# Patient Record
Sex: Male | Born: 1965 | Race: Black or African American | Hispanic: No | Marital: Married | Smoking: Former smoker
Health system: Southern US, Community
[De-identification: ages and names within clinical notes are randomized; demographics above are authoritative.]

## PROBLEM LIST (undated history)

## (undated) DIAGNOSIS — I2510 Atherosclerotic heart disease of native coronary artery without angina pectoris: Secondary | ICD-10-CM

## (undated) DIAGNOSIS — I471 Supraventricular tachycardia, unspecified: Secondary | ICD-10-CM

## (undated) DIAGNOSIS — I4711 Inappropriate sinus tachycardia, so stated: Secondary | ICD-10-CM

## (undated) DIAGNOSIS — I272 Pulmonary hypertension, unspecified: Secondary | ICD-10-CM

## (undated) DIAGNOSIS — I700 Atherosclerosis of aorta: Secondary | ICD-10-CM

## (undated) DIAGNOSIS — D649 Anemia, unspecified: Secondary | ICD-10-CM

## (undated) DIAGNOSIS — I10 Essential (primary) hypertension: Secondary | ICD-10-CM

## (undated) DIAGNOSIS — E46 Unspecified protein-calorie malnutrition: Secondary | ICD-10-CM

## (undated) DIAGNOSIS — Z7901 Long term (current) use of anticoagulants: Secondary | ICD-10-CM

## (undated) DIAGNOSIS — F1010 Alcohol abuse, uncomplicated: Secondary | ICD-10-CM

## (undated) DIAGNOSIS — G893 Neoplasm related pain (acute) (chronic): Secondary | ICD-10-CM

## (undated) DIAGNOSIS — K5790 Diverticulosis of intestine, part unspecified, without perforation or abscess without bleeding: Secondary | ICD-10-CM

## (undated) DIAGNOSIS — S72009A Fracture of unspecified part of neck of unspecified femur, initial encounter for closed fracture: Secondary | ICD-10-CM

## (undated) DIAGNOSIS — J449 Chronic obstructive pulmonary disease, unspecified: Secondary | ICD-10-CM

## (undated) DIAGNOSIS — I5020 Unspecified systolic (congestive) heart failure: Secondary | ICD-10-CM

## (undated) DIAGNOSIS — I7090 Unspecified atherosclerosis: Secondary | ICD-10-CM

## (undated) DIAGNOSIS — J329 Chronic sinusitis, unspecified: Secondary | ICD-10-CM

## (undated) DIAGNOSIS — Z87442 Personal history of urinary calculi: Secondary | ICD-10-CM

## (undated) DIAGNOSIS — I6789 Other cerebrovascular disease: Secondary | ICD-10-CM

## (undated) DIAGNOSIS — K760 Fatty (change of) liver, not elsewhere classified: Secondary | ICD-10-CM

## (undated) DIAGNOSIS — J439 Emphysema, unspecified: Secondary | ICD-10-CM

## (undated) DIAGNOSIS — C801 Malignant (primary) neoplasm, unspecified: Secondary | ICD-10-CM

## (undated) DIAGNOSIS — C3491 Malignant neoplasm of unspecified part of right bronchus or lung: Secondary | ICD-10-CM

## (undated) DIAGNOSIS — R0609 Other forms of dyspnea: Secondary | ICD-10-CM

## (undated) DIAGNOSIS — I071 Rheumatic tricuspid insufficiency: Secondary | ICD-10-CM

## (undated) DIAGNOSIS — E860 Dehydration: Secondary | ICD-10-CM

## (undated) HISTORY — DX: Neoplasm related pain (acute) (chronic): G89.3

## (undated) HISTORY — PX: TOE SURGERY: SHX1073

---
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
## 2008-05-23 ENCOUNTER — Emergency Department: Payer: Self-pay | Admitting: Emergency Medicine

## 2014-07-04 ENCOUNTER — Inpatient Hospital Stay: Payer: Self-pay | Admitting: Internal Medicine

## 2014-07-04 LAB — CBC
HCT: 47.6 % (ref 40.0–52.0)
HGB: 15.8 g/dL (ref 13.0–18.0)
MCH: 33.7 pg (ref 26.0–34.0)
MCHC: 33.1 g/dL (ref 32.0–36.0)
MCV: 102 fL — ABNORMAL HIGH (ref 80–100)
Platelet: 147 10*3/uL — ABNORMAL LOW (ref 150–440)
RBC: 4.68 10*6/uL (ref 4.40–5.90)
RDW: 12.7 % (ref 11.5–14.5)
WBC: 35 10*3/uL — ABNORMAL HIGH (ref 3.8–10.6)

## 2014-07-04 LAB — HEMOGLOBIN A1C: Hemoglobin A1C: 4.4 % (ref 4.2–6.3)

## 2014-07-04 LAB — APTT: ACTIVATED PTT: 29.8 s (ref 23.6–35.9)

## 2014-07-04 LAB — COMPREHENSIVE METABOLIC PANEL
ALT: 45 U/L
AST: 36 U/L (ref 15–37)
Albumin: 3.5 g/dL (ref 3.4–5.0)
Alkaline Phosphatase: 78 U/L
Anion Gap: 8 (ref 7–16)
BILIRUBIN TOTAL: 1 mg/dL (ref 0.2–1.0)
BUN: 13 mg/dL (ref 7–18)
CHLORIDE: 109 mmol/L — AB (ref 98–107)
CO2: 27 mmol/L (ref 21–32)
Calcium, Total: 8.7 mg/dL (ref 8.5–10.1)
Creatinine: 1.08 mg/dL (ref 0.60–1.30)
Glucose: 152 mg/dL — ABNORMAL HIGH (ref 65–99)
Osmolality: 290 (ref 275–301)
POTASSIUM: 3.5 mmol/L (ref 3.5–5.1)
SODIUM: 144 mmol/L (ref 136–145)
Total Protein: 7.2 g/dL (ref 6.4–8.2)

## 2014-07-04 LAB — CK TOTAL AND CKMB (NOT AT ARMC)
CK, Total: 126 U/L (ref 39–308)
CK-MB: 1.6 ng/mL (ref 0.5–3.6)

## 2014-07-04 LAB — TROPONIN I
Troponin-I: 0.1 ng/mL — ABNORMAL HIGH
Troponin-I: 0.19 ng/mL — ABNORMAL HIGH
Troponin-I: 0.21 ng/mL — ABNORMAL HIGH

## 2014-07-04 LAB — PROTIME-INR
INR: 1
Prothrombin Time: 13.1 secs (ref 11.5–14.7)

## 2014-07-04 LAB — PRO B NATRIURETIC PEPTIDE: B-TYPE NATIURETIC PEPTID: 321 pg/mL — AB (ref 0–125)

## 2014-07-05 DIAGNOSIS — I361 Nonrheumatic tricuspid (valve) insufficiency: Secondary | ICD-10-CM

## 2014-07-05 LAB — BASIC METABOLIC PANEL
Anion Gap: 9 (ref 7–16)
BUN: 9 mg/dL (ref 7–18)
CHLORIDE: 103 mmol/L (ref 98–107)
Calcium, Total: 9.1 mg/dL (ref 8.5–10.1)
Co2: 26 mmol/L (ref 21–32)
Creatinine: 0.76 mg/dL (ref 0.60–1.30)
EGFR (Non-African Amer.): >60
Glucose: 122 mg/dL — ABNORMAL HIGH (ref 65–99)
OSMOLALITY: 276 (ref 275–301)
POTASSIUM: 3.9 mmol/L (ref 3.5–5.1)
Sodium: 138 mmol/L (ref 136–145)

## 2014-07-05 LAB — CBC WITH DIFFERENTIAL/PLATELET
Basophil #: 0.1 10*3/uL (ref 0.0–0.1)
Basophil %: 0.4 %
EOS PCT: 0 %
Eosinophil #: 0 10*3/uL (ref 0.0–0.7)
HCT: 46.5 % (ref 40.0–52.0)
HGB: 15.6 g/dL (ref 13.0–18.0)
LYMPHS PCT: 2.1 %
Lymphocyte #: 0.7 10*3/uL — ABNORMAL LOW (ref 1.0–3.6)
MCH: 34 pg (ref 26.0–34.0)
MCHC: 33.5 g/dL (ref 32.0–36.0)
MCV: 102 fL — AB (ref 80–100)
Monocyte #: 0.8 x10 3/mm (ref 0.2–1.0)
Monocyte %: 2.4 %
NEUTROS PCT: 95.1 %
Neutrophil #: 32.4 10*3/uL — ABNORMAL HIGH (ref 1.4–6.5)
Platelet: 138 10*3/uL — ABNORMAL LOW (ref 150–440)
RBC: 4.58 10*6/uL (ref 4.40–5.90)
RDW: 13 % (ref 11.5–14.5)
WBC: 34.1 10*3/uL — ABNORMAL HIGH (ref 3.8–10.6)

## 2014-07-07 LAB — CBC WITH DIFFERENTIAL/PLATELET
Basophil #: 0.1 10*3/uL (ref 0.0–0.1)
Basophil %: 0.6 %
Eosinophil #: 0.3 10*3/uL (ref 0.0–0.7)
Eosinophil %: 2.8 %
HCT: 46.9 % (ref 40.0–52.0)
HGB: 15.6 g/dL (ref 13.0–18.0)
Lymphocyte #: 1.5 10*3/uL (ref 1.0–3.6)
Lymphocyte %: 12.9 %
MCH: 34.1 pg — AB (ref 26.0–34.0)
MCHC: 33.4 g/dL (ref 32.0–36.0)
MCV: 102 fL — AB (ref 80–100)
Monocyte #: 0.6 x10 3/mm (ref 0.2–1.0)
Monocyte %: 5.6 %
Neutrophil #: 9 10*3/uL — ABNORMAL HIGH (ref 1.4–6.5)
Neutrophil %: 78.1 %
Platelet: 139 10*3/uL — ABNORMAL LOW (ref 150–440)
RBC: 4.58 10*6/uL (ref 4.40–5.90)
RDW: 12.7 % (ref 11.5–14.5)
WBC: 11.5 10*3/uL — AB (ref 3.8–10.6)

## 2014-07-07 LAB — BASIC METABOLIC PANEL
ANION GAP: 6 — AB (ref 7–16)
BUN: 13 mg/dL (ref 7–18)
CREATININE: 1.05 mg/dL (ref 0.60–1.30)
Calcium, Total: 9 mg/dL (ref 8.5–10.1)
Chloride: 106 mmol/L (ref 98–107)
Co2: 27 mmol/L (ref 21–32)
EGFR (Non-African Amer.): >60
GLUCOSE: 93 mg/dL (ref 65–99)
Osmolality: 277 (ref 275–301)
Potassium: 3.2 mmol/L — ABNORMAL LOW (ref 3.5–5.1)
Sodium: 139 mmol/L (ref 136–145)

## 2014-07-09 LAB — CULTURE, BLOOD (SINGLE)

## 2014-10-31 NOTE — Consult Note (Signed)
PATIENT NAME:  Samuel Choi, Samuel Choi MR#:  [id number] DATE OF BIRTH:  1966/01/31  DATE OF CONSULTATION:  07/05/2014  CONSULTING PHYSICIAN:  Dr. Posey Pronto  REASON FOR CONSULTATION: Elevation of troponin.   CHIEF COMPLAINT: "I am short of breath."   HISTORY OF PRESENT ILLNESS:  This is a 49 year old male with no evidence of cardiac history, who has had new onset of cough, congestion, and severe shortness of breath over the last 48 hours for which he has had an elevated white blood cell count as well as rhonchi and other symptoms, but no evidence of significant amount of chest discomfort. He does have an elevated troponin of 0.13 consistent with demand ischemia rather than acute coronary syndrome at this time. The patient has had no cardiovascular history, but there are family history issues. He has had some improvement with antibiotics oxygenation. He has had normal heart rate control with an EKG showing no evidence of significant myocardial ischemia.  The remainder review of systems negative for vision change, ringing in the ears, or hearing loss, heartburn, nausea, vomiting, diarrhea, bloody stools, stomach pain, extremity pain, leg weakness, cramping of the buttocks, known blood clots, headaches, blackouts, dizzy spells, nosebleeds, frequent urination, urination at night, muscle weakness, numbness, anxiety, depression, skin lesions or skin rashes.   PAST MEDICAL HISTORY: Essential hypertension.   FAMILY HISTORY: Multiple family history of cardiovascular disease in the past as well as hypertension.   SOCIAL HISTORY: Currently denies alcohol or tobacco use.   ALLERGIES: As listed.   MEDICATIONS: As listed.   PHYSICAL EXAMINATION:  VITAL SIGNS: Blood pressure is 110/68 bilaterally. Heart rate is 72 upright, reclining, and regular.   GENERAL: He is a well-appearing male in no acute distress.   HEENT:  No icterus, thyromegaly, ulcers, hemorrhage, or xanthelasma.   CARDIOVASCULAR: Regular rate and rhythm  with normal S1 and S2 without murmur, gallop, or rub. PMI is diffuse. Carotid upstroke normal without bruit. Jugular venous pressure is normal.   LUNGS: Have expiratory wheezes and decrease in the bases.   ABDOMEN: Soft, nontender, without hepatosplenomegaly or masses. Abdominal aorta has normal size without bruit.   EXTREMITIES: Show 2+ radial, femoral, dorsal pedal pulses, with no lower extremity edema, cyanosis, clubbing or ulcers.   NEUROLOGIC: He is oriented to time, place, and person, with normal mood and affect.   ASSESSMENT: A 49 year old male with essential hypertension with acute elevated troponin, most consistent with demand ischemia from current illness and hypoxia, and pneumonia rather than acute coronary syndrome.   RECOMMENDATIONS:  1.  Consider echocardiogram for LV systolic dysfunction, valvular heart disease possibly contributing to above.  2.  Continue supportive care of pneumonia and/or hypoxia with antibiotics.  3.  Begin ambulation and follow for any other need for diagnostic testing and treatment options at this time.      ____________________________ Corey Skains, MD bjk:at D: 07/05/2014 09:13:00 ET T: 07/05/2014 10:35:02 ET JOB#: [id number]  cc: Corey Skains, MD, <Dictator> Corey Skains MD ELECTRONICALLY SIGNED 07/08/2014 13:48

## 2014-10-31 NOTE — H&P (Signed)
PATIENT NAME:  Samuel Choi, BOFFA MR#:  [id number] DATE OF BIRTH:  Feb 14, 1966  DATE OF ADMISSION:  07/04/2014  PRIMARY CARE PHYSICIAN: Unknown.  CHIEF COMPLAINT: Shortness of breath.   HISTORY OF PRESENT ILLNESS: This is a 49 year old male who presented to the ED today after 1 day of significant increased shortness of breath and dyspnea on exertion. He has no significant past medical history that he reports and is not on any medications currently. In the ED, he was found to have oxygen saturation at 87% on room air. The patient states that he has over the past 2 days been having some sensation of feeling hot, though he denies having measured any fever and denies any significant chills. Chest x-ray in the ED showed bilateral opacities consistent with either bilateral pneumonia or bilateral fluid on his lungs. The patient's troponin was 0.1 in the ED. His BNP was very mildly elevated at 321. His heart rate was tachycardic at just over 100. His temperature was borderline febrile. His white blood cell count was found to be 35,000. He was given a dose of vancomycin and a dose of Zosyn in the ED and the hospitalists were called for admission. The patient denies any alleviating factors to his shortness of breath and states that it is aggravated by exertion. He denies ever having anything like this before. He states that as a child he had asthma, but has not had any symptoms of his asthma for many years. The patient did not try to take any medications to alleviate his symptoms.   PAST MEDICAL HISTORY: The patient endorses only childhood asthma.   PAST SURGICAL HISTORY: The patient denies any surgical history.   FAMILY HISTORY:  The patient endorses a family history of CAD, diabetes, hypertension, hyperlipidemia.   ALLERGIES: No known drug allergies.   SOCIAL HISTORY: The patient lives with his wife at home. He is a smoker and has smoked for the last 30 years, about a half pack per day. He endorses alcohol use,  about 2 beers a couple of times a week and he denies any illicit drug use.   CURRENT MEDICATIONS: The patient denies any current medications.   REVIEW OF SYSTEMS:  CONSTITUTIONAL: Denies measured fever. Endorses some fatigue. Denies overt weakness. Denies any weight changes.   EYES: No blurring or double vision. No pain in his eyes. No redness.   ENT: No ear pain. No hearing loss. No rhinorrhea. No sinus pain. No difficulty swallowing.   RESPIRATORY: The patient endorses mild cough without sputum. Denies any wheezing. Denies hemoptysis. Endorses significant dyspnea on exertion. Denies any pain with respirations.   CARDIOVASCULAR: Denies any chest pain. Endorses orthopnea. Denies any edema. Denies palpitations. Denies syncope.   GASTROINTESTINAL: Denies nausea, vomiting, diarrhea. Denies abdominal pain. Denies hematemesis.   GENITOURINARY: Denies dysuria, denies hematuria, denies any frequency or incontinence.   HEMATOLOGIC: Denies any easy bruising or bleeding.   SKIN: Denies any acne, rash or lesions.   MUSCULOSKELETAL: Denies any pain in his joints or swelling in his joints. Denies gout. Denies arthritis.   NEUROLOGICAL: Denies any sensory changes. Denies any focal weakness. Denies headaches. Denies seizures.   PSYCHIATRIC: Denies any anxiety, insomnia or depression.   PHYSICAL EXAMINATION:  VITAL SIGNS: Temperature is 99.8, pulse 102, respirations 20, blood pressure 200/110, pulse oximetry 95% on 2 liters in the ED.   GENERAL: The patient is sitting up in bed in no acute distress and appears well nourished.   HEENT: Pupils equal, round and reactive  to light. Extraocular movements intact, though he does have left eye lateral gaze deviation, which is chronic. No scleral icterus. No difficulty hearing. Moist mucosal membranes.   NECK: No thyromegaly. His neck is supple with no masses and it is nontender. No cervical lymphadenopathy.   RESPIRATORY: Has bilateral decreased basilar  breath sounds with rhonchi present in both bases. He has good air movement in his lungs on both sides from his mid field up without any abnormal sounds above his bases. His breathing is not labored. No wheezing was appreciated on auscultation.   CARDIOVASCULAR: Tachycardic with a regular rhythm. No murmurs auscultated. S1, S2 present. His chest is nontender. He has good pedal pulses without any lower extremity edema.   ABDOMEN: Soft, nontender, nondistended. Good bowel sounds without any organomegaly.   MUSCULOSKELETAL: He has no joint swelling. No cyanosis. No clubbing.   SKIN: No rash or lesions noted. No induration. His skin is warm and dry.   NEUROLOGIC: His cranial nerves are intact except for the chronic left eye lateral gaze deviation as previously noted. He has good sensation intact throughout with no dysarthria or aphasia. No focal deficits noted.   PSYCHIATRIC: He is alert and oriented x 3. He is cooperative. He demonstrates good judgment without any depressed mood or affect.   PERTINENT LABORATORIES: Include BNP mildly elevated at 321, creatinine 1.08. Troponin 0.1. White blood count 35, hemoglobin 15.8, platelets 147,000. Point-of-care glucose 204.   PERTINENT RADIOLOGY: Includes a chest x-ray which showed interstitial infiltrates extending from the perihilar regions into both bases, slightly greater on the left.   ASSESSMENT AND PLAN: This is a 49 year old male here for 1 day of dyspnea and orthopnea without any reported significant past medical history, currently on no medications, found in the emergency department to meet systemic inflammatory response syndrome criteria with an elevated white count and tachycardia and a chest x-ray indicative of pneumonia.  1. Sepsis with systemic inflammatory response syndrome plus a likely pneumonia as the source. Antibiotics were started in the emergency department. No fluids were given as the patient's blood pressure was significantly elevated.  We will admit to the stepdown unit and continue his intravenous antibiotics and we will draw blood cultures and follow those for results to tailor his antibiotics appropriately.  2. Community-acquired pneumonia. Management as above. The patient will get DuoNebs p.r.n. for any shortness of breath or wheezing. We will also send a sputum culture and follow that for growth and sensitivities.  3. Accelerated hypertension. The patient has no prior history of this. In the emergency department, his blood pressure was running 789 to 381 systolic pressure. Intravenous labetalol given in the emergency department did not do much to bring his blood pressure down. We will repeat at higher dosing and the patient may likely need a nicardipine drip on the floor if his blood pressure does not respond to repeat labetalol doses.  4. Elevated troponin. It is very mildly elevated at 0.1. This could possibly just be from cardiac stress from his pneumonia. We will trend his cardiac enzymes and order an echocardiogram. Given very mildly elevated B-type natriuretic peptide, the likelihood of significant heart failure is lower on our list. We will consider cardiology consult if his troponins continued to rise significantly or if his echocardiogram shows significant congestive heart failure.  5. Elevated glucose. This is also likely due to his sepsis; however, we will check a hemoglobin A1c just to screen him, as the patient has not had significant outpatient followup.  6.  Deep vein thrombosis prophylaxis includes mechanical prophylaxis with sequential compression devices.  7. This patient is full code.   TIME SPENT ON THIS ADMISSION: 45 minutes.    ____________________________ Wilford Corner. Jannifer Franklin, MD dfw:TT D: 07/04/2014 18:43:29 ET T: 07/04/2014 20:35:20 ET JOB#: [id number]  cc: Wilford Corner. Jannifer Franklin, MD, <Dictator> Adrean Findlay Fawn Kirk MD ELECTRONICALLY SIGNED 07/05/2014 10:46

## 2014-11-02 ENCOUNTER — Emergency Department
Admit: 2014-11-02 | Disposition: A | Discharge status: Home / Self Care | Payer: Self-pay | Admitting: Emergency Medicine

## 2014-11-02 LAB — COMPREHENSIVE METABOLIC PANEL
ALK PHOS: 83 U/L
ANION GAP: 10 (ref 7–16)
Albumin: 4 g/dL
BILIRUBIN TOTAL: 1.1 mg/dL
BUN: 11 mg/dL
Calcium, Total: 8.9 mg/dL
Chloride: 101 mmol/L
Co2: 26 mmol/L
Creatinine: 1.06 mg/dL
EGFR (African American): >60
EGFR (Non-African Amer.): >60
GLUCOSE: 100 mg/dL — AB
Potassium: 3.5 mmol/L
SGOT(AST): 40 U/L
SGPT (ALT): 23 U/L
Sodium: 137 mmol/L
TOTAL PROTEIN: 7.8 g/dL

## 2014-11-02 LAB — URINALYSIS, COMPLETE
Bacteria: NONE SEEN
Bilirubin,UR: NEGATIVE
Glucose,UR: NEGATIVE mg/dL (ref 0–75)
Ketone: NEGATIVE
NITRITE: NEGATIVE
Ph: 9 (ref 4.5–8.0)
Protein: 30
Specific Gravity: 1.013 (ref 1.003–1.030)

## 2014-11-02 LAB — CBC
HCT: 46 % (ref 40.0–52.0)
HGB: 15.9 g/dL (ref 13.0–18.0)
MCH: 34.6 pg — AB (ref 26.0–34.0)
MCHC: 34.6 g/dL (ref 32.0–36.0)
MCV: 100 fL (ref 80–100)
PLATELETS: 143 10*3/uL — AB (ref 150–440)
RBC: 4.6 10*6/uL (ref 4.40–5.90)
RDW: 13.1 % (ref 11.5–14.5)
WBC: 21.9 10*3/uL — ABNORMAL HIGH (ref 3.8–10.6)

## 2014-11-02 LAB — LACTIC ACID, PLASMA: Lactic Acid, Venous: 0.9 mmol/L

## 2014-11-04 NOTE — Discharge Summary (Signed)
PATIENT NAME:  Samuel Choi, Samuel Choi MR#:  [id number] DATE OF BIRTH:  05/07/1966  DATE OF ADMISSION:  07/04/2014 DATE OF DISCHARGE:  07/07/2014  ADMITTING DIAGNOSIS: Shortness of breath.   DISCHARGE DIAGNOSES:  1.  Shortness of breath due to community-acquired pneumonia.  2.  Sepsis, likely due to bilateral pneumonia.  3.  Accelerated hypertension.  4.  Elevated troponin, felt to be due to demand ischemia.  5.  Elevated blood sugar with hemoglobin A1c being normal.   PERTINENT LABORATORIES AND EVALUATIONS: Admitting glucose 152, BUN 13, creatinine 1.08, sodium 144, potassium 3.5, chloride 27, calcium 8.7. LFTs were normal. Troponin 0.10, 0.19, 0.21. WBC 35, hemoglobin 15.8, platelet count 147,000. INR 1.0. Blood cultures x 2, no growth.   Echocardiogram of the heart showed EF 70% to 75%, global left ventricular systolic function, impaired diastolic filling defect, moderate elevated pulmonary hypertension. Chest x-ray: Bilateral pulmonary infiltrates extending from perihilar regions in the base, greater than on left. Questionable pneumonia versus asymmetrical edema. Chest x-ray repeated on the 29th showed improvement and aeration of the lungs.   HOSPITAL COURSE: Please refer to H and P done by the admitting physician. The patient is a 49 year old male who presented to the ED with shortness of breath, dyspnea on exertion. He has no significant past medical history, per his report. The patient came in to the ED with saturations in the 80s. The patient was noted to have bilateral pneumonia. He was admitted and placed on IV antibiotics. The patient's respiratory status improved rapidly. He was also noted to have accelerated hypertension, which was treated. Now his breathing is improved and his blood pressure has improved. I strongly recommended the patient keep a log for his primary care to review his blood pressure and adjust his medications. He also had a slightly elevated troponin and was seen by Dr.  Nehemiah Massed who felt that this was due to cardiac strain from his pneumonia. Recommended the patient for further symptoms, if he has recurrence or any chest pain then he will need further evaluation.   DISCHARGE MEDICATIONS: Aspirin 81 one tablet p.o. daily, metoprolol succinate 100 daily, Levaquin 500 one tablet p.o. q. 24 x 5 days, Catapres 0.2 one tablet p.o. b.i.d.   DISCHARGE DIET: Low-sodium, low-fat, low-cholesterol.   ACTIVITY: As tolerated.   FOLLOWUP: With  primary MD in 1-2 weeks. The patient to keep a log of blood pressure to take to primary MD.   TIME SPENT ON THIS DISCHARGE: Thirty-five minutes.    ____________________________ Lafonda Mosses Posey Pronto, MD shp:TT D: 07/08/2014 13:14:24 ET T: 07/08/2014 15:15:25 ET JOB#: [id number]  cc: Marthella Osorno H. Posey Pronto, MD, <Dictator> Alric Seton MD ELECTRONICALLY SIGNED 07/12/2014 12:20

## 2017-08-23 ENCOUNTER — Emergency Department
Admission: EM | Admit: 2017-08-23 | Discharge: 2017-08-23 | Disposition: A | Discharge status: Home / Self Care | Payer: BLUE CROSS/BLUE SHIELD | Attending: Emergency Medicine | Admitting: Emergency Medicine

## 2017-08-23 ENCOUNTER — Other Ambulatory Visit: Payer: Self-pay

## 2017-08-23 DIAGNOSIS — I10 Essential (primary) hypertension: Secondary | ICD-10-CM | POA: Diagnosis not present

## 2017-08-23 DIAGNOSIS — R040 Epistaxis: Secondary | ICD-10-CM | POA: Diagnosis not present

## 2017-08-23 DIAGNOSIS — F17210 Nicotine dependence, cigarettes, uncomplicated: Secondary | ICD-10-CM | POA: Diagnosis not present

## 2017-08-23 HISTORY — DX: Essential (primary) hypertension: I10

## 2017-08-23 LAB — CBC WITH DIFFERENTIAL/PLATELET
Basophils Absolute: 0.1 10*3/uL (ref 0–0.1)
Basophils Relative: 1 %
EOS ABS: 0.4 10*3/uL (ref 0–0.7)
Eosinophils Relative: 3 %
HCT: 43 % (ref 40.0–52.0)
Hemoglobin: 14.7 g/dL (ref 13.0–18.0)
Lymphocytes Relative: 16 %
Lymphs Abs: 1.8 10*3/uL (ref 1.0–3.6)
MCH: 34.4 pg — ABNORMAL HIGH (ref 26.0–34.0)
MCHC: 34.1 g/dL (ref 32.0–36.0)
MCV: 100.9 fL — ABNORMAL HIGH (ref 80.0–100.0)
MONO ABS: 0.9 10*3/uL (ref 0.2–1.0)
MONOS PCT: 8 %
Neutro Abs: 7.8 10*3/uL — ABNORMAL HIGH (ref 1.4–6.5)
Neutrophils Relative %: 72 %
Platelets: 160 10*3/uL (ref 150–440)
RBC: 4.27 MIL/uL — ABNORMAL LOW (ref 4.40–5.90)
RDW: 12.7 % (ref 11.5–14.5)
WBC: 10.9 10*3/uL — ABNORMAL HIGH (ref 3.8–10.6)

## 2017-08-23 MED ORDER — SILVER NITRATE-POT NITRATE 75-25 % EX MISC
CUTANEOUS | Status: AC
  Administered 2017-08-23: 1 via TOPICAL
  Filled 2017-08-23: qty 1

## 2017-08-23 MED ORDER — SILVER NITRATE-POT NITRATE 75-25 % EX MISC
1.0000 | Freq: Once | CUTANEOUS | Status: AC
  Administered 2017-08-23: 1 via TOPICAL
  Filled 2017-08-23: qty 1

## 2017-08-23 MED ORDER — TRANEXAMIC ACID 1000 MG/10ML IV SOLN
500.0000 mg | Freq: Once | INTRAVENOUS | Status: AC
  Administered 2017-08-23: 500 mg via TOPICAL
  Filled 2017-08-23: qty 10

## 2017-08-23 NOTE — ED Notes (Signed)
Pt states started having nosebleeds this week. Has nasal packing in currently. Denies blood thinner use. Denies nasal congestion or irritation. Alert, oriented. Family member with pt.

## 2017-08-23 NOTE — ED Notes (Signed)
First nurse note  Brought over with nose bleed since tuesday

## 2017-08-23 NOTE — ED Triage Notes (Signed)
Pt started with nose bleed Tuesday - he went to walk in clinic and nose bleed was stopped - nose bleed started back today off and on since 530pm

## 2017-08-23 NOTE — ED Provider Notes (Signed)
[facility] Emergency Department Provider Note  ____________________________________________   I have reviewed the triage vital signs and the nursing notes. Where available I have reviewed prior notes and, if possible and indicated, outside hospital notes.    HISTORY  Chief Complaint Epistaxis    HPI Samuel Choi is a 52 y.o. male who presents today planing of epistaxis out of the right nose.  He has had a couple episodes since Monday.  He has an appointment with Dr. Pryor Ochoa tomorrow but he had some more bleeding today so he came to be checked out.  He is not on aspirin or any blood thinners.  He denies being lightheaded or any other bleeding or bruising.  He is not bleeding at this time.  He denies trauma or posterior bleed.  Side of the right anterior nares   Past Medical History:  Diagnosis Date  . Hypertension     There are no active problems to display for this patient.   History reviewed. No pertinent surgical history.  Prior to Admission medications   Not on File    Allergies Patient has no known allergies.  No family history on file.  Social History Social History   Tobacco Use  . Smoking status: Current Every Day Smoker    Packs/day: 0.50    Types: Cigarettes  . Smokeless tobacco: Never Used  Substance Use Topics  . Alcohol use: Yes    Alcohol/week: 1.8 oz    Types: 3 Cans of beer per week  . Drug use: No    Review of Systems Constitutional: No fever/chills Eyes: No visual changes. ENT: No sore throat. No stiff neck no neck pain Cardiovascular: Denies chest pain. Respiratory: Denies shortness of breath. Gastrointestinal:   no vomiting.  No diarrhea.  No constipation. Genitourinary: Negative for dysuria. Musculoskeletal: Negative lower extremity swelling Skin: Negative for rash. Neurological: Negative for severe headaches, focal weakness or numbness.   ____________________________________________   PHYSICAL  EXAM:  VITAL SIGNS: ED Triage Vitals  Enc Vitals Group     BP 08/23/17 1926 (!) 185/106     Pulse Rate 08/23/17 1926 76     Resp 08/23/17 1926 16     Temp 08/23/17 1926 98.5 F (36.9 C)     Temp Source 08/23/17 1926 Oral     SpO2 08/23/17 1926 97 %     Weight 08/23/17 1922 162 lb (73.5 kg)     Height 08/23/17 1922 6\' 2"  (1.88 m)     Head Circumference --      Peak Flow --      Pain Score 08/23/17 1922 0     Pain Loc --      Pain Edu? --      Excl. in [place]? --     Constitutional: Alert and oriented. Well appearing and in no acute distress. Eyes: Conjunctivae are normal Head: Atraumatic HEENT: Right nares there is no active bleeding at this time there is some dried clot in there.  I did put TXA in the nose anyway.. Mucous membranes are moist.  Oropharynx non-erythematous Neck:   Nontender with no meningismus, no masses, no stridor Cardiovascular: Normal rate, regular rhythm. Grossly normal heart sounds.  Good peripheral circulation. Respiratory: Normal respiratory effort.  No retractions. Lungs CTAB. Neurologic:  Normal speech and language. No gross focal neurologic deficits are appreciated.  Skin:  Skin is warm, dry and intact. No rash noted. Psychiatric: Mood and affect are normal. Speech and behavior are normal.  ____________________________________________  LABS (all labs ordered are listed, but only abnormal results are displayed)  Labs Reviewed  CBC WITH DIFFERENTIAL/PLATELET - Abnormal; Notable for the following components:      Result Value   WBC 10.9 (*)    RBC 4.27 (*)    MCV 100.9 (*)    MCH 34.4 (*)    Neutro Abs 7.8 (*)    All other components within normal limits  PROTIME-INR    Pertinent labs  results that were available during my care of the patient were reviewed by me and considered in my medical decision making (see chart for details). ____________________________________________  EKG  I personally interpreted any EKGs ordered by me or  triage  ____________________________________________  RADIOLOGY  Pertinent labs & imaging results that were available during my care of the patient were reviewed by me and considered in my medical decision making (see chart for details). If possible, patient and/or family made aware of any abnormal findings.  No results found. ____________________________________________    PROCEDURES  Procedure(s) performed: TXA administration to the right nares followed by cauterization with silver nitrate  Procedures  Critical Care performed: None  ____________________________________________   INITIAL IMPRESSION / ASSESSMENT AND PLAN / ED COURSE  Pertinent labs & imaging results that were available during my care of the patient were reviewed by me and considered in my medical decision making (see chart for details).  Ministration of TXA was able to investigate the nose, there is a small area on the septum which appears to be the possible source and I was able to cauterize that with silver nitrate, he tolerated this very well.  There is no evidence of ongoing bleeding the entire time he was here, he does have close follow-up with ENT, I cannot be sure that that was the source of bleeding as I did not witness a great deal bleeding but there did appear to be a small scab there.  In any event, patient will follow closely return precautions follow-up given and understood, hemoglobin reassuring platelet count reassuring.    ____________________________________________   FINAL CLINICAL IMPRESSION(S) / ED DIAGNOSES  Final diagnoses:  None      This chart was dictated using voice recognition software.  Despite best efforts to proofread,  errors can occur which can change meaning.      Schuyler Amor, MD 08/23/17 2150

## 2017-09-27 DIAGNOSIS — I10 Essential (primary) hypertension: Secondary | ICD-10-CM | POA: Insufficient documentation

## 2017-09-27 DIAGNOSIS — Z720 Tobacco use: Secondary | ICD-10-CM | POA: Insufficient documentation

## 2017-10-08 HISTORY — PX: BRAIN SURGERY: SHX531

## 2017-10-08 HISTORY — PX: NASAL SINUS SURGERY: SHX719

## 2017-10-10 DIAGNOSIS — J3489 Other specified disorders of nose and nasal sinuses: Secondary | ICD-10-CM | POA: Insufficient documentation

## 2017-10-10 DIAGNOSIS — J310 Chronic rhinitis: Secondary | ICD-10-CM | POA: Insufficient documentation

## 2017-10-10 DIAGNOSIS — J324 Chronic pansinusitis: Secondary | ICD-10-CM | POA: Insufficient documentation

## 2017-10-10 DIAGNOSIS — R220 Localized swelling, mass and lump, head: Secondary | ICD-10-CM

## 2017-10-10 DIAGNOSIS — J019 Acute sinusitis, unspecified: Secondary | ICD-10-CM | POA: Insufficient documentation

## 2018-02-20 DIAGNOSIS — Z8709 Personal history of other diseases of the respiratory system: Secondary | ICD-10-CM | POA: Insufficient documentation

## 2018-05-03 ENCOUNTER — Other Ambulatory Visit: Payer: Self-pay | Admitting: Internal Medicine

## 2018-05-03 DIAGNOSIS — I10 Essential (primary) hypertension: Secondary | ICD-10-CM

## 2018-05-03 DIAGNOSIS — E876 Hypokalemia: Secondary | ICD-10-CM

## 2018-05-09 ENCOUNTER — Ambulatory Visit
Admission: RE | Admit: 2018-05-09 | Discharge: 2018-05-09 | Disposition: A | Discharge status: Home / Self Care | Payer: BLUE CROSS/BLUE SHIELD | Source: Ambulatory Visit | Attending: Internal Medicine | Admitting: Internal Medicine

## 2018-05-09 DIAGNOSIS — I10 Essential (primary) hypertension: Secondary | ICD-10-CM

## 2018-05-09 DIAGNOSIS — E876 Hypokalemia: Secondary | ICD-10-CM

## 2018-05-09 MED ORDER — IOPAMIDOL (ISOVUE-370) INJECTION 76%
100.0000 mL | Freq: Once | INTRAVENOUS | Status: AC | PRN
  Administered 2018-05-09: 100 mL via INTRAVENOUS

## 2018-05-10 DIAGNOSIS — Z87442 Personal history of urinary calculi: Secondary | ICD-10-CM

## 2018-05-10 DIAGNOSIS — S72009A Fracture of unspecified part of neck of unspecified femur, initial encounter for closed fracture: Secondary | ICD-10-CM

## 2018-05-10 HISTORY — DX: Fracture of unspecified part of neck of unspecified femur, initial encounter for closed fracture: S72.009A

## 2018-05-10 HISTORY — DX: Personal history of urinary calculi: Z87.442

## 2018-06-03 ENCOUNTER — Emergency Department: Payer: BLUE CROSS/BLUE SHIELD

## 2018-06-03 ENCOUNTER — Emergency Department
Admission: EM | Admit: 2018-06-03 | Discharge: 2018-06-03 | Disposition: A | Discharge status: Home / Self Care | Payer: BLUE CROSS/BLUE SHIELD | Attending: Emergency Medicine | Admitting: Emergency Medicine

## 2018-06-03 ENCOUNTER — Other Ambulatory Visit: Payer: Self-pay

## 2018-06-03 DIAGNOSIS — Y929 Unspecified place or not applicable: Secondary | ICD-10-CM | POA: Insufficient documentation

## 2018-06-03 DIAGNOSIS — Y9301 Activity, walking, marching and hiking: Secondary | ICD-10-CM | POA: Insufficient documentation

## 2018-06-03 DIAGNOSIS — F17210 Nicotine dependence, cigarettes, uncomplicated: Secondary | ICD-10-CM | POA: Diagnosis not present

## 2018-06-03 DIAGNOSIS — R911 Solitary pulmonary nodule: Secondary | ICD-10-CM | POA: Diagnosis not present

## 2018-06-03 DIAGNOSIS — S79911A Unspecified injury of right hip, initial encounter: Secondary | ICD-10-CM | POA: Diagnosis present

## 2018-06-03 DIAGNOSIS — S72001A Fracture of unspecified part of neck of right femur, initial encounter for closed fracture: Secondary | ICD-10-CM | POA: Insufficient documentation

## 2018-06-03 DIAGNOSIS — I10 Essential (primary) hypertension: Secondary | ICD-10-CM | POA: Insufficient documentation

## 2018-06-03 DIAGNOSIS — Y999 Unspecified external cause status: Secondary | ICD-10-CM | POA: Insufficient documentation

## 2018-06-03 DIAGNOSIS — W010XXA Fall on same level from slipping, tripping and stumbling without subsequent striking against object, initial encounter: Secondary | ICD-10-CM | POA: Insufficient documentation

## 2018-06-03 DIAGNOSIS — S72009A Fracture of unspecified part of neck of unspecified femur, initial encounter for closed fracture: Secondary | ICD-10-CM

## 2018-06-03 DIAGNOSIS — W101XXA Fall (on)(from) sidewalk curb, initial encounter: Secondary | ICD-10-CM | POA: Insufficient documentation

## 2018-06-03 LAB — CBC WITH DIFFERENTIAL/PLATELET
ABS IMMATURE GRANULOCYTES: 0.04 10*3/uL (ref 0.00–0.07)
Basophils Absolute: 0.1 10*3/uL (ref 0.0–0.1)
Basophils Relative: 1 %
EOS ABS: 0.3 10*3/uL (ref 0.0–0.5)
EOS PCT: 3 %
HEMATOCRIT: 47.2 % (ref 39.0–52.0)
HEMOGLOBIN: 15.3 g/dL (ref 13.0–17.0)
Immature Granulocytes: 0 %
Lymphocytes Relative: 16 %
Lymphs Abs: 2.1 10*3/uL (ref 0.7–4.0)
MCH: 35.8 pg — ABNORMAL HIGH (ref 26.0–34.0)
MCHC: 32.4 g/dL (ref 30.0–36.0)
MCV: 110.5 fL — AB (ref 80.0–100.0)
Monocytes Absolute: 0.9 10*3/uL (ref 0.1–1.0)
Monocytes Relative: 7 %
Neutro Abs: 9.5 10*3/uL — ABNORMAL HIGH (ref 1.7–7.7)
Neutrophils Relative %: 73 %
Platelets: 139 10*3/uL — ABNORMAL LOW (ref 150–400)
RBC: 4.27 MIL/uL (ref 4.22–5.81)
RDW: 11.8 % (ref 11.5–15.5)
WBC: 12.9 10*3/uL — AB (ref 4.0–10.5)
nRBC: 0 % (ref 0.0–0.2)

## 2018-06-03 LAB — APTT: APTT: 35 s (ref 24–36)

## 2018-06-03 LAB — BASIC METABOLIC PANEL
Anion gap: 13 (ref 5–15)
BUN: 14 mg/dL (ref 6–20)
CALCIUM: 9.4 mg/dL (ref 8.9–10.3)
CO2: 23 mmol/L (ref 22–32)
CREATININE: 0.7 mg/dL (ref 0.61–1.24)
Chloride: 106 mmol/L (ref 98–111)
GFR calc non Af Amer: >60 mL/min (ref >60)
Glucose, Bld: 67 mg/dL — ABNORMAL LOW (ref 70–99)
Potassium: 3.8 mmol/L (ref 3.5–5.1)
SODIUM: 142 mmol/L (ref 135–145)

## 2018-06-03 LAB — PROTIME-INR
INR: 1.04
Prothrombin Time: 13.5 seconds (ref 11.4–15.2)

## 2018-06-03 LAB — TYPE AND SCREEN
ABO/RH(D): A POS
Antibody Screen: NEGATIVE

## 2018-06-03 MED ORDER — NAPROXEN 500 MG PO TABS: 500.0000 mg | ORAL_TABLET | Freq: Two times a day (BID) | ORAL | 2 refills | Status: DC

## 2018-06-03 MED ORDER — HYDROCODONE-ACETAMINOPHEN 5-325 MG PO TABS: 1.0000 | ORAL_TABLET | ORAL | 0 refills | Status: DC | PRN

## 2018-06-03 MED ORDER — FENTANYL CITRATE (PF) 100 MCG/2ML IJ SOLN
50.0000 ug | INTRAMUSCULAR | Status: AC | PRN
  Administered 2018-06-03 (×2): 50 ug via INTRAVENOUS
  Filled 2018-06-03 (×2): qty 2

## 2018-06-03 MED ORDER — IOHEXOL 300 MG/ML  SOLN
75.0000 mL | Freq: Once | INTRAMUSCULAR | Status: AC | PRN
  Administered 2018-06-03: 75 mL via INTRAVENOUS

## 2018-06-03 NOTE — ED Provider Notes (Signed)
[facility] Emergency Department Provider Note   ____________________________________________    I have reviewed the triage vital signs and the nursing notes.   HISTORY  Chief Complaint Hip Pain     HPI Samuel Choi is a 52 y.o. male who presents with reports of right hip pain.  Patient reports that he fell 3 days ago, he tripped on a curb and fell onto his right hip.  He has had significant right hip pain since then.  No radiation.  Has taken over-the-counter medications with some improvement.  Went to urgent care today and they report that x-ray demonstrated right hip fracture, sent to the emergency department.  No other injuries.  Reports a history of high blood pressure   Past Medical History:  Diagnosis Date  . Hypertension     There are no active problems to display for this patient.   History reviewed. No pertinent surgical history.  Prior to Admission medications   Medication Sig Start Date End Date Taking? Authorizing Provider  HYDROcodone-acetaminophen (NORCO/VICODIN) 5-325 MG tablet Take 1 tablet by mouth every 4 (four) hours as needed for moderate pain. 06/03/18   Lavonia Drafts, MD  naproxen (NAPROSYN) 500 MG tablet Take 1 tablet (500 mg total) by mouth 2 (two) times daily with a meal. 06/03/18   Lavonia Drafts, MD     Allergies Patient has no known allergies.  No family history on file.  Social History Social History   Tobacco Use  . Smoking status: Current Every Day Smoker    Packs/day: 0.50    Types: Cigarettes  . Smokeless tobacco: Never Used  Substance Use Topics  . Alcohol use: Yes    Alcohol/week: 3.0 standard drinks    Types: 3 Cans of beer per week  . Drug use: No    Review of Systems  Constitutional: No dizziness Eyes: No visual changes.  ENT: No neck pain Cardiovascular: No chest wall pain Respiratory: Denies shortness of breath. Gastrointestinal: No abdominal pain.   Genitourinary: Negative for  dysuria. Musculoskeletal: As above Skin: Negative for rash. Neurological: Negative for headaches    ____________________________________________   PHYSICAL EXAM:  VITAL SIGNS: ED Triage Vitals  Enc Vitals Group     BP 06/03/18 1517 (S) (!) 205/105     Pulse Rate 06/03/18 1517 66     Resp 06/03/18 1517 18     Temp 06/03/18 1517 98.6 F (37 C)     Temp Source 06/03/18 1517 Oral     SpO2 06/03/18 1517 97 %     Weight 06/03/18 1518 67.1 kg (148 lb)     Height 06/03/18 1518 1.854 m (6\' 1" )     Head Circumference --      Peak Flow --      Pain Score 06/03/18 1518 8     Pain Loc --      Pain Edu? --      Excl. in [place]? --     Constitutional: Alert and oriented. No acute distress. Pleasant and interactive   Head: Atraumatic. Nose: No congestion/rhinnorhea. Mouth/Throat: Mucous membranes are moist.   Neck:  Painless ROM Cardiovascular: Normal rate, regular rhythm. Grossly normal heart sounds.  Good peripheral circulation. Respiratory: Normal respiratory effort.  No retractions. Lungs CTAB.  Gastrointestinal: Soft and nontender. No distention.    Musculoskeletal: Tenderness palpation along the right hip particularly over the greater trochanter, pain with axial load, warm and well perfused distally Neurologic:  Normal speech and language. No gross  focal neurologic deficits are appreciated.  Skin:  Skin is warm, dry and intact. No rash noted. Psychiatric: Mood and affect are normal. Speech and behavior are normal.  ____________________________________________   LABS (all labs ordered are listed, but only abnormal results are displayed)  Labs Reviewed  BASIC METABOLIC PANEL - Abnormal; Notable for the following components:      Result Value   Glucose, Bld 67 (*)    All other components within normal limits  CBC WITH DIFFERENTIAL/PLATELET - Abnormal; Notable for the following components:   WBC 12.9 (*)    MCV 110.5 (*)    MCH 35.8 (*)    Platelets 139 (*)    Neutro Abs  9.5 (*)    All other components within normal limits  PROTIME-INR  APTT  TYPE AND SCREEN  TYPE AND SCREEN   ____________________________________________  EKG  ED ECG REPORT I, Lavonia Drafts, the attending physician, personally viewed and interpreted this ECG.  Date: 06/03/2018  Rhythm: normal sinus rhythm QRS Axis: normal Intervals: normal ST/T Wave abnormalities: normal Narrative Interpretation: no evidence of acute ischemia  ____________________________________________  RADIOLOGY  Chest x-ray: Contacted by radiologist and notified of 15 mm mass right upper lobe, CT with contrast recommended  Hip x-ray no conclusive fracture, MRI recommended.  I discussed with Dr. Martinique of radiology who agrees that CT would be appropriate initially as well given that the patient will go for CT of his chest ____________________________________________   PROCEDURES  Procedure(s) performed: No  Procedures   Critical Care performed: No ____________________________________________   INITIAL IMPRESSION / ASSESSMENT AND PLAN / ED COURSE  Pertinent labs & imaging results that were available during my care of the patient were reviewed by me and considered in my medical decision making (see chart for details).  Discussed with Dr. Marry Guan of orthopedics, requests repeat films.  We will give IV fentanyl for pain, obtain labs and reevaluate.  CT scan of the hip demonstrates partial trochanter fracture, discussed at length with Dr. Marry Guan, he recommends discharge with weightbearing as tolerated with walker he will follow-up in the office.  Discussed lung nodule with Dr. Tasia Catchings whose office will contact the patient to arrange further testing.  Discussed possibility of cancer given CT chest    ____________________________________________   FINAL CLINICAL IMPRESSION(S) / ED DIAGNOSES  Final diagnoses:  Closed fracture of right hip, initial encounter [facility])  Lung nodule seen on imaging study          Note:  This document was prepared using Dragon voice recognition software and may include unintentional dictation errors.    Lavonia Drafts, MD 06/03/18 [id number]

## 2018-06-03 NOTE — ED Triage Notes (Addendum)
Pt brought over by [facility] with c/o broken hip. Pt states he tripped and fell and landed on his right side. Pt denies LOC or hitting his head.  Pt states 8/10 pain.  Xray reveals broke hip per RN Opal Sidles

## 2018-06-03 NOTE — ED Notes (Signed)
Sent recollect to the lab.

## 2018-06-05 ENCOUNTER — Other Ambulatory Visit: Payer: Self-pay

## 2018-06-05 ENCOUNTER — Encounter: Payer: Self-pay | Admitting: Oncology

## 2018-06-05 ENCOUNTER — Inpatient Hospital Stay: Payer: BLUE CROSS/BLUE SHIELD | Attending: Oncology | Admitting: Oncology

## 2018-06-05 VITALS — BP 144/72 | HR 86 | Temp 96.8°F | Resp 18 | Ht 73.0 in | Wt 149.2 lb

## 2018-06-05 DIAGNOSIS — M25551 Pain in right hip: Secondary | ICD-10-CM | POA: Diagnosis not present

## 2018-06-05 DIAGNOSIS — R911 Solitary pulmonary nodule: Secondary | ICD-10-CM | POA: Diagnosis not present

## 2018-06-05 DIAGNOSIS — Z801 Family history of malignant neoplasm of trachea, bronchus and lung: Secondary | ICD-10-CM | POA: Diagnosis not present

## 2018-06-05 DIAGNOSIS — R590 Localized enlarged lymph nodes: Secondary | ICD-10-CM | POA: Diagnosis not present

## 2018-06-05 DIAGNOSIS — I10 Essential (primary) hypertension: Secondary | ICD-10-CM | POA: Diagnosis not present

## 2018-06-05 DIAGNOSIS — Z803 Family history of malignant neoplasm of breast: Secondary | ICD-10-CM

## 2018-06-05 DIAGNOSIS — Z720 Tobacco use: Secondary | ICD-10-CM | POA: Diagnosis not present

## 2018-06-05 NOTE — Progress Notes (Signed)
Patient here for initial visit. Patient requests any labwork be done through [facility].

## 2018-06-05 NOTE — Progress Notes (Signed)
Hematology/Oncology Consult note [facility] Telephone:([phone] Fax:([phone]) [phone]   Patient Care Team: Tracie Harrier, MD as PCP - General (Internal Medicine) [name], RN as Registered Nurse  REFERRING PROVIDER: ED physician CHIEF COMPLAINTS/REASON FOR VISIT:  Evaluation of lung mass  HISTORY OF PRESENTING ILLNESS:  Samuel Choi is a  52 y.o.  male with PMH listed below who was referred to me for evaluation of lung mass. Patient recently presented to emergency room complaining of right hip pain.  He reports that he had a fell 3 days prior to the presentation, tripped on curb and fell onto his right hip.  Significant right hip pain since then. Patient also went to urgent care today and x-ray demonstrated right hip fracture.  Was sent to emergency room.  Also has a history of uncontrolled high blood pressure recently had abdomen angiogram done which showed no evidence of renal artery stenosis or other vascular findings. Patient had a CT scan of the hip demonstrate partial trochanter fracture.  Patient was advised to follow-up outpatient. Chest x-ray was done on 06/03/2018 which showed 50 mm mass in the right upper lobe.  And also possible right-sided mediastinal adenopathy.  CT chest with contrast was obtained in the ED which showed a 1.3 cm slightly shaggy marginated pulmonary nodule, with right hilar adenopathy.  Appearance concerning for lung malignancy.  Patient was advised to follow-up at cancer center for further management. Today patient was accompanied by his wife.  He has no respiratory symptoms.  Denies any chest pain, shortness of breath, cough, hemoptysis, or any weight loss. He drinks beer every other day chronically. Daily smoker, 7.5-pack-year smoking history.   Review of Systems  Constitutional: Negative for appetite change, chills, fatigue, fever and unexpected weight change.  HENT:   Negative for hearing loss and voice change.     Eyes: Negative for eye problems and icterus.  Respiratory: Negative for chest tightness, cough and shortness of breath.   Cardiovascular: Negative for chest pain and leg swelling.  Gastrointestinal: Negative for abdominal distention and abdominal pain.  Endocrine: Negative for hot flashes.  Genitourinary: Negative for difficulty urinating, dysuria and frequency.   Musculoskeletal: Negative for arthralgias.       Right hip pain  Skin: Negative for itching and rash.  Neurological: Negative for light-headedness and numbness.  Hematological: Negative for adenopathy. Does not bruise/bleed easily.  Psychiatric/Behavioral: Negative for confusion.    MEDICAL HISTORY:  Past Medical History:  Diagnosis Date  . Hypertension     SURGICAL HISTORY: Past Surgical History:  Procedure Laterality Date  . BRAIN SURGERY    . BRAIN SURGERY      SOCIAL HISTORY: Social History   Socioeconomic History  . Marital status: Married    Spouse name: Not on file  . Number of children: Not on file  . Years of education: Not on file  . Highest education level: Not on file  Occupational History  . Not on file  Social Needs  . Financial resource strain: Not on file  . Food insecurity:    Worry: Not on file    Inability: Not on file  . Transportation needs:    Medical: Not on file    Non-medical: Not on file  Tobacco Use  . Smoking status: Current Every Day Smoker    Packs/day: 0.50    Years: 15.00    Pack years: 7.50    Types: Cigarettes  . Smokeless tobacco: Never Used  Substance and Sexual Activity  .  Alcohol use: Yes    Alcohol/week: 3.0 standard drinks    Types: 3 Cans of beer per week  . Drug use: No  . Sexual activity: Not on file  Lifestyle  . Physical activity:    Days per week: Not on file    Minutes per session: Not on file  . Stress: Not on file  Relationships  . Social connections:    Talks on phone: Not on file    Gets together: Not on file    Attends religious  service: Not on file    Active member of club or organization: Not on file    Attends meetings of clubs or organizations: Not on file    Relationship status: Not on file  . Intimate partner violence:    Fear of current or ex partner: Not on file    Emotionally abused: Not on file    Physically abused: Not on file    Forced sexual activity: Not on file  Other Topics Concern  . Not on file  Social History Narrative  . Not on file    FAMILY HISTORY: Family History  Problem Relation Age of Onset  . Breast cancer Mother   . Diabetes Mother   . Lung cancer Father   . Hypertension Father     ALLERGIES:  has No Known Allergies.  MEDICATIONS:  Current Outpatient Medications  Medication Sig Dispense Refill  . amLODipine (NORVASC) 10 MG tablet Take by mouth.    . cloNIDine (CATAPRES) 0.1 MG tablet TAKE 1 TABLET (0.1 MG TOTAL) BY MOUTH 2 (TWO) TIMES DAILY  5  . hydrALAZINE (APRESOLINE) 100 MG tablet Take by mouth.    . hydrochlorothiazide (HYDRODIURIL) 12.5 MG tablet Take by mouth.    Marland Kitchen HYDROcodone-acetaminophen (NORCO/VICODIN) 5-325 MG tablet Take 1 tablet by mouth every 4 (four) hours as needed for moderate pain. 20 tablet 0  . KLOR-CON M10 10 MEQ tablet TAKE 1 TABLET (10 MEQ TOTAL) BY MOUTH 2 (TWO) TIMES DAILY  0  . naproxen (NAPROSYN) 500 MG tablet Take 1 tablet (500 mg total) by mouth 2 (two) times daily with a meal. 20 tablet 2  . olmesartan (BENICAR) 40 MG tablet Take by mouth.     No current facility-administered medications for this visit.      PHYSICAL EXAMINATION: ECOG PERFORMANCE STATUS: 0 - Asymptomatic Vitals:   06/05/18 0922  BP: (!) 144/72  Pulse: 86  Resp: 18  Temp: (!) 96.8 F (36 C)  SpO2: 97%   Filed Weights   06/05/18 0922  Weight: 149 lb 3.2 oz (67.7 kg)    Physical Exam  Constitutional: He is oriented to person, place, and time. No distress.  Thin  HENT:  Head: Normocephalic and atraumatic.  Mouth/Throat: Oropharynx is clear and moist.  Eyes:  Pupils are equal, round, and reactive to light. EOM are normal. No scleral icterus.  Neck: Normal range of motion. Neck supple.  Cardiovascular: Normal rate, regular rhythm and normal heart sounds.  Pulmonary/Chest: Effort normal and breath sounds normal. No respiratory distress. He has no wheezes.  Abdominal: Soft. Bowel sounds are normal. He exhibits no distension and no mass. There is no tenderness.  Musculoskeletal: Normal range of motion. He exhibits no edema or deformity.  Neurological: He is alert and oriented to person, place, and time. No cranial nerve deficit. Coordination normal.  Skin: Skin is warm and dry. No rash noted. No erythema.  Psychiatric: He has a normal mood and affect. His behavior is normal.  Thought content normal.     LABORATORY DATA:  I have reviewed the data as listed Lab Results  Component Value Date   WBC 12.9 (H) 06/03/2018   HGB 15.3 06/03/2018   HCT 47.2 06/03/2018   MCV 110.5 (H) 06/03/2018   PLT 139 (L) 06/03/2018   Recent Labs    06/03/18 1541  NA 142  K 3.8  CL 106  CO2 23  GLUCOSE 67*  BUN 14  CREATININE 0.70  CALCIUM 9.4  GFRNONAA >60  GFRAA >60   Iron/TIBC/Ferritin/ %Sat No results found for: IRON, TIBC, FERRITIN, IRONPCTSAT      ASSESSMENT & PLAN:  1. Hilar adenopathy   2. Solitary pulmonary nodule   3. Tobacco abuse    CT image was independently reviewed by me and discussed with patient. With his history of smoking, questionable primary lung malignancy versus infectious etiology  Recommend proceeding with PET scan. He most likely need to see pulmonology for bronchoscopy biopsy. Further management plan pending PET scan results. Smoking cessation was discussed with patient. Orders Placed This Encounter  Procedures  . NM PET Image Initial (PI) Skull Base To Thigh    Standing Status:   Future    Standing Expiration Date:   06/05/2019    Order Specific Question:   ** REASON FOR EXAM (FREE TEXT)    Answer:   lung mass     Order Specific Question:   If indicated for the ordered procedure, I authorize the administration of a radiopharmaceutical per Radiology protocol    Answer:   Yes    Order Specific Question:   Preferred imaging location?    Answer:   [facility]    Order Specific Question:   Radiology Contrast Protocol - do NOT remove file path    Answer:   [email or web address]    All questions were answered. The patient knows to call the clinic with any problems questions or concerns.  Return of visit: To be determined Thank you for this kind referral and the opportunity to participate in the care of this patient. A copy of today's note is routed to referring provider  Total face to face encounter time for this patient visit was 45 min. >50% of the time was  spent in counseling and coordination of care.    Earlie Server, MD, PhD Hematology Oncology [facility] Pager- [phone] 06/05/2018

## 2018-06-09 DIAGNOSIS — C3491 Malignant neoplasm of unspecified part of right bronchus or lung: Secondary | ICD-10-CM

## 2018-06-09 DIAGNOSIS — J439 Emphysema, unspecified: Secondary | ICD-10-CM | POA: Diagnosis present

## 2018-06-09 DIAGNOSIS — I7090 Unspecified atherosclerosis: Secondary | ICD-10-CM

## 2018-06-09 DIAGNOSIS — J309 Allergic rhinitis, unspecified: Secondary | ICD-10-CM | POA: Diagnosis present

## 2018-06-09 HISTORY — DX: Malignant neoplasm of unspecified part of right bronchus or lung: C34.91

## 2018-06-09 HISTORY — DX: Unspecified atherosclerosis: I70.90

## 2018-06-09 HISTORY — DX: Emphysema, unspecified: J43.9

## 2018-06-13 ENCOUNTER — Telehealth: Payer: Self-pay | Admitting: *Deleted

## 2018-06-13 ENCOUNTER — Ambulatory Visit
Admission: RE | Admit: 2018-06-13 | Discharge: 2018-06-13 | Disposition: A | Discharge status: Home / Self Care | Payer: BLUE CROSS/BLUE SHIELD | Source: Ambulatory Visit | Attending: Oncology | Admitting: Oncology

## 2018-06-13 DIAGNOSIS — N200 Calculus of kidney: Secondary | ICD-10-CM | POA: Insufficient documentation

## 2018-06-13 DIAGNOSIS — I700 Atherosclerosis of aorta: Secondary | ICD-10-CM | POA: Diagnosis not present

## 2018-06-13 DIAGNOSIS — R590 Localized enlarged lymph nodes: Secondary | ICD-10-CM | POA: Diagnosis not present

## 2018-06-13 DIAGNOSIS — R911 Solitary pulmonary nodule: Secondary | ICD-10-CM | POA: Diagnosis not present

## 2018-06-13 DIAGNOSIS — J432 Centrilobular emphysema: Secondary | ICD-10-CM | POA: Diagnosis not present

## 2018-06-13 LAB — GLUCOSE, CAPILLARY: Glucose-Capillary: 85 mg/dL (ref 70–99)

## 2018-06-13 MED ORDER — FLUDEOXYGLUCOSE F - 18 (FDG) INJECTION
7.7000 | Freq: Once | INTRAVENOUS | Status: AC | PRN
  Administered 2018-06-13: 7.55 via INTRAVENOUS

## 2018-06-13 NOTE — Telephone Encounter (Signed)
Called asking for results, patient has no follow up appointment     IMPRESSION: 1. Right upper lobe hypermetabolic pulmonary nodule with right hilar hypermetabolic adenopathy. 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 2. Mildly hypermetabolic focus in the deep left parotid gland is likely correlated with a 9 mm nodule. Suspicious for synchronous primary neoplasm (benign or malignant). Suboptimally evaluated secondary to beam hardening artifact in this region. Consider dedicated neck CT. 3. Right femoral hypermetabolism, likely due to a nondisplaced fracture on prior dedicated CT. 4. Aortic atherosclerosis (ICD10-I70.0) and emphysema (ICD10-J43.9). 5. Bilateral nephrolithiasis.   Electronically Signed   By: Abigail Miyamoto M.D.   On: 06/13/2018 14:57

## 2018-06-14 ENCOUNTER — Encounter: Payer: Self-pay | Admitting: *Deleted

## 2018-06-14 DIAGNOSIS — R911 Solitary pulmonary nodule: Secondary | ICD-10-CM

## 2018-06-14 NOTE — Progress Notes (Signed)
  Oncology Nurse Navigator Documentation  Navigator Location: [facility] (06/14/18 0900) Referral date to RadOnc/MedOnc: 06/03/18 (06/14/18 0900) )Navigator Encounter Type: Introductory phone call (06/14/18 0900)   Abnormal Finding Date: 06/03/18 (06/14/18 0900)                   Treatment Phase: Abnormal Scans (06/14/18 0900) Barriers/Navigation Needs: Coordination of Care (06/14/18 0900)   Interventions: Coordination of Care (06/14/18 0900)   Coordination of Care: Appts;Radiology (06/14/18 0900)        Acuity: Level 2 (06/14/18 0900)   Acuity Level 2: Initial guidance, education and coordination as needed;Educational needs;Assistance expediting appointments (06/14/18 0900)  phone call made to patient to introduce to navigator services. Results from recent PET scan reviewed with patient. Informed pt that will be referred to pulmonologist to evaluate for bronchoscopy to obtain tissue diagnosis. Pt informed that will Dr. Mortimer Fries on Monday 12/9 at 11am. All questions answered during phone call. Contact info given and instructed to call with further questions or needs. Pt verbalized understanding.   Time Spent with Patient: 30 (06/14/18 0900)

## 2018-06-17 ENCOUNTER — Ambulatory Visit (INDEPENDENT_AMBULATORY_CARE_PROVIDER_SITE_OTHER): Payer: BLUE CROSS/BLUE SHIELD | Admitting: Internal Medicine

## 2018-06-17 ENCOUNTER — Telehealth: Payer: Self-pay

## 2018-06-17 ENCOUNTER — Encounter: Payer: Self-pay | Admitting: Internal Medicine

## 2018-06-17 VITALS — BP 156/80 | HR 91 | Resp 16 | Ht 73.0 in | Wt 148.0 lb

## 2018-06-17 DIAGNOSIS — F17210 Nicotine dependence, cigarettes, uncomplicated: Secondary | ICD-10-CM

## 2018-06-17 DIAGNOSIS — R911 Solitary pulmonary nodule: Secondary | ICD-10-CM

## 2018-06-17 NOTE — Addendum Note (Signed)
Addended by: Darreld Mclean on: 06/17/2018 12:15 PM   Modules accepted: Orders

## 2018-06-17 NOTE — H&P (View-Only) (Signed)
Name: Samuel Choi MRN: [id number] DOB: 07/21/1965     CONSULTATION DATE: 06/17/2018 REFERRING MD : Samuel Choi  CHIEF COMPLAINT: Abnormal CT chest  STUDIES:   06/03/2018 CT chest Independently reviewed by Me today Right upper lobe nodule seen on CT chest Right hilar lymphadenopathy   HISTORY OF PRESENT ILLNESS: 52 year old African-American male seen today for abnormal CT of chest He is a chronic smoker 1/2 pack/day for last 20 years  Patient fell and broke his hip in November Patient underwent CT scans of his abdomen and pelvis which incidentally we found a right upper lobe lung nodule  A dedicated CT chest showed right upper lobe nodule with right hilar adenopathy Findings to suggest clinical stage II lung cancer  Patient was sent here from Dr. Collie Choi office for referral for abnormal CT chest I have reviewed the CT scans with Samuel Choi and his wife  Patient has no shortness of breath no chest pain no palpitations No fevers no chills no weight loss  Positive smoking he 3+ alcohol abuse works at a Writer at [employer]   Smoking cessation advised Patient takes Naprosyn for his fractured hip Advised to stop taking any type of NSAID at this time Will need 5 to 7 days off of NSAIDs before procedure can be performed   Smoking Assessment and Cessation Counseling   Upon further questioning, Patient smokes 1/2 ppd  I have advised patient to quit/stop smoking as soon as possible due to high risk for multiple medical problems  Patient  is NOT willing to quit smoking  I have advised patient that we can assist and have options of Nicotine replacement therapy. I also advised patient on behavioral therapy and can provide oral medication therapy in conjunction with the other therapies  Follow up next Office visit  for assessment of smoking cessation  Smoking cessation counseling advised for 4 minutes     The Risks and Benefits of the Bronchoscopy procedure with  electromagnetic navigational bronchoscopy and endobronchial ultrasound were explained to patient/family.  I have discussed the risk for Acute Bleeding, increased chance of Infection, increased chance of Respiratory Failure and Cardiac Arrest, increased chance of pneumothorax and collapsed lung, as well as increased Stroke and Death.  I have also explained to avoid all types of NSAIDs 1 week prior to procedure date  to decrease chance of bleeding, and also to avoid food and drinks the midnight prior to procedure.  The procedure consists of a video camera with a light source to be placed and inserted  into the lungs to  look for abnormal tissue and to obtain tissue samples by using needle and biopsy tools.  The patient/family understand the risks and benefits and have agreed to proceed with procedure.    PAST MEDICAL HISTORY :   has a past medical history of Hypertension.  has a past surgical history that includes Brain surgery and Brain surgery. Prior to Admission medications   Medication Sig Start Date End Date Taking? Authorizing Provider  amLODipine (NORVASC) 10 MG tablet Take by mouth. 08/28/17   [provider]  cloNIDine (CATAPRES) 0.1 MG tablet TAKE 1 TABLET (0.1 MG TOTAL) BY MOUTH 2 (TWO) TIMES DAILY 05/02/18   [provider]  hydrALAZINE (APRESOLINE) 100 MG tablet Take by mouth. 09/04/17 09/04/18  [provider]  hydrochlorothiazide (HYDRODIURIL) 12.5 MG tablet Take by mouth. 09/27/17   [provider]  HYDROcodone-acetaminophen (NORCO/VICODIN) 5-325 MG tablet Take 1 tablet by mouth every 4 (four) hours as  needed for moderate pain. 06/03/18   Lavonia Drafts, MD  KLOR-CON M10 10 MEQ tablet TAKE 1 TABLET (10 MEQ TOTAL) BY MOUTH 2 (TWO) TIMES DAILY 04/29/18   [provider]  naproxen (NAPROSYN) 500 MG tablet Take 1 tablet (500 mg total) by mouth 2 (two) times daily with a meal. 06/03/18   Lavonia Drafts, MD  olmesartan (BENICAR) 40 MG tablet  Take by mouth. 08/28/17   [provider]   No Known Allergies  FAMILY HISTORY:  family history includes Breast cancer in his mother; Diabetes in his mother; Hypertension in his father; Lung cancer in his father. SOCIAL HISTORY:  reports that he has been smoking cigarettes. He has a 7.50 pack-year smoking history. He has never used smokeless tobacco. He reports that he drinks about 3.0 standard drinks of alcohol per week. He reports that he does not use drugs.  REVIEW OF SYSTEMS:   Constitutional: Negative for fever, chills, weight loss, malaise/fatigue and diaphoresis.  HENT: Negative for hearing loss, ear pain, nosebleeds, congestion, sore throat, neck pain, tinnitus and ear discharge.   Eyes: Negative for blurred vision, double vision, photophobia, pain, discharge and redness.  Respiratory: Negative for cough, hemoptysis, sputum production, shortness of breath, wheezing and stridor.   Cardiovascular: Negative for chest pain, palpitations, orthopnea, claudication, leg swelling and PND.  Gastrointestinal: Negative for heartburn, nausea, vomiting, abdominal pain, diarrhea, constipation, blood in stool and melena.  Genitourinary: Negative for dysuria, urgency, frequency, hematuria and flank pain.  Musculoskeletal: Negative for myalgias, back pain, joint pain and falls.  Skin: Negative for itching and rash.  Neurological: Negative for dizziness, tingling, tremors, sensory change, speech change, focal weakness, seizures, loss of consciousness, weakness and headaches.  Endo/Heme/Allergies: Negative for environmental allergies and polydipsia. Does not bruise/bleed easily.  ALL OTHER ROS ARE NEGATIVE  BP (!) 156/80 (BP Location: Right Arm, Cuff Size: Normal)   Pulse 91   Resp 16   Ht 6\' 1"  (1.854 m)   Wt 148 lb (67.1 kg)   SpO2 98%   BMI 19.53 kg/m    Physical Examination:   GENERAL:NAD, no fevers, chills, no weakness no fatigue HEAD: Normocephalic, atraumatic.  EYES: Pupils  equal, round, reactive to light. Extraocular muscles intact. No scleral icterus.  MOUTH: Moist mucosal membrane.   EAR, NOSE, THROAT: Clear without exudates. No external lesions.  NECK: Supple. No thyromegaly. No nodules. No JVD.  PULMONARY:CTA B/L no wheezes, no crackles, no rhonchi CARDIOVASCULAR: S1 and S2. Regular rate and rhythm. No murmurs, rubs, or gallops. No edema.  GASTROINTESTINAL: Soft, nontender, nondistended. No masses. Positive bowel sounds.  MUSCULOSKELETAL: No swelling, clubbing, or edema. Range of motion full in all extremities.  NEUROLOGIC: Cranial nerves II through XII are intact. No gross focal neurological deficits.  SKIN: No ulceration, lesions, rashes, or cyanosis. Skin warm and dry. Turgor intact.  PSYCHIATRIC: Mood, affect within normal limits. The patient is awake, alert and oriented x 3. Insight, judgment intact.      ASSESSMENT / PLAN: 52 year old African-American male with abnormal CT chest with right upper lobe lung nodule with right hilar adenopathy in the setting of tobacco abuse which strongly suggest primary lung cancer with lymphadenopathy clinical stage II  The risks of electromagnetic navigation bronchoscopy and endobronchial ultrasound were explained to the patient I have advised the patient stop taking his NSAIDs  Plan for procedure next week   AFTER FURTHER ASSESSMENT, THE LUNG NODULE IS DEEMED INACCESSIBLE BY STANDARD BRONCHOSCOPIC PROCEDURES, PATIENT IS ALSO A CANDIDATE FOR FIDUCIAL PLACEMENT  and IS NOT A CANDIDATE FOR SURGERY.    Patient/Family are satisfied with Plan of action and management. All questions answered  Corrin Parker, M.D.  [facility]  Medical Director [place] Director [facility] Cardio-Pulmonary Department

## 2018-06-17 NOTE — Telephone Encounter (Signed)
Bronchoscopy EBUS and ENB CPT: [id number], [id number], 46568 Date: 06/25/18 @13 :00 Super D DX: Right Upper lobe mass, right hilar adenopathy

## 2018-06-17 NOTE — Progress Notes (Addendum)
Name: Samuel Choi MRN: [id number] DOB: 05/01/1966     CONSULTATION DATE: 06/17/2018 REFERRING MD : Tasia Catchings  CHIEF COMPLAINT: Abnormal CT chest  STUDIES:   06/03/2018 CT chest Independently reviewed by Me today Right upper lobe nodule seen on CT chest Right hilar lymphadenopathy   HISTORY OF PRESENT ILLNESS: 52 year old African-American male seen today for abnormal CT of chest He is a chronic smoker 1/2 pack/day for last 20 years  Patient fell and broke his hip in November Patient underwent CT scans of his abdomen and pelvis which incidentally we found a right upper lobe lung nodule  A dedicated CT chest showed right upper lobe nodule with right hilar adenopathy Findings to suggest clinical stage II lung cancer  Patient was sent here from Dr. 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 I also advised patient on behavioral therapy and can provide oral medication therapy in conjunction with the other therapies  Follow up next Office visit  for assessment of smoking cessation  Smoking cessation counseling advised for 4 minutes     The Risks and Benefits of the Bronchoscopy procedure with  electromagnetic navigational bronchoscopy and endobronchial ultrasound were explained to patient/family.  I have discussed the risk for Acute Bleeding, increased chance of Infection, increased chance of Respiratory Failure and Cardiac Arrest, increased chance of pneumothorax and collapsed lung, as well as increased Stroke and Death.  I have also explained to avoid all types of NSAIDs 1 week prior to procedure date  to decrease chance of bleeding, and also to avoid food and drinks the midnight prior to procedure.  The procedure consists of a video camera with a light source to be placed and inserted  into the lungs to  look for abnormal tissue and to obtain tissue samples by using needle and biopsy tools.  The patient/family understand the risks and benefits and have agreed to proceed with procedure.    PAST MEDICAL HISTORY :   has a past medical history of Hypertension.  has a past surgical history that includes Brain surgery and Brain surgery. Prior to Admission medications   Medication Sig Start Date End Date Taking? Authorizing Provider  amLODipine (NORVASC) 10 MG tablet Take by mouth. 08/28/17   [provider]  cloNIDine (CATAPRES) 0.1 MG tablet TAKE 1 TABLET (0.1 MG TOTAL) BY MOUTH 2 (TWO) TIMES DAILY 05/02/18   [provider]  hydrALAZINE (APRESOLINE) 100 MG tablet Take by mouth. 09/04/17 09/04/18  [provider]  hydrochlorothiazide (HYDRODIURIL) 12.5 MG tablet Take by mouth. 09/27/17   [provider]  HYDROcodone-acetaminophen (NORCO/VICODIN) 5-325 MG tablet Take 1 tablet by mouth every 4 (four) hours as  needed for moderate pain. 06/03/18   Lavonia Drafts, MD  KLOR-CON M10 10 MEQ tablet TAKE 1 TABLET (10 MEQ TOTAL) BY MOUTH 2 (TWO) TIMES DAILY 04/29/18   [provider]  naproxen (NAPROSYN) 500 MG tablet Take 1 tablet (500 mg total) by mouth 2 (two) times daily with a meal. 06/03/18   Lavonia Drafts, MD  olmesartan (BENICAR) 40 MG tablet  Take by mouth. 08/28/17   [provider]   No Known Allergies  FAMILY HISTORY:  family history includes Breast cancer in his mother; Diabetes in his mother; Hypertension in his father; Lung cancer in his father. SOCIAL HISTORY:  reports that he has been smoking cigarettes. He has a 7.50 pack-year smoking history. He has never used smokeless tobacco. He reports that he drinks about 3.0 standard drinks of alcohol per week. He reports that he does not use drugs.  REVIEW OF SYSTEMS:   Constitutional: Negative for fever, chills, weight loss, malaise/fatigue and diaphoresis.  HENT: Negative for hearing loss, ear pain, nosebleeds, congestion, sore throat, neck pain, tinnitus and ear discharge.   Eyes: Negative for blurred vision, double vision, photophobia, pain, discharge and redness.  Respiratory: Negative for cough, hemoptysis, sputum production, shortness of breath, wheezing and stridor.   Cardiovascular: Negative for chest pain, palpitations, orthopnea, claudication, leg swelling and PND.  Gastrointestinal: Negative for heartburn, nausea, vomiting, abdominal pain, diarrhea, constipation, blood in stool and melena.  Genitourinary: Negative for dysuria, urgency, frequency, hematuria and flank pain.  Musculoskeletal: Negative for myalgias, back pain, joint pain and falls.  Skin: Negative for itching and rash.  Neurological: Negative for dizziness, tingling, tremors, sensory change, speech change, focal weakness, seizures, loss of consciousness, weakness and headaches.  Endo/Heme/Allergies: Negative for environmental allergies and polydipsia. Does not bruise/bleed easily.  ALL OTHER ROS ARE NEGATIVE  BP (!) 156/80 (BP Location: Right Arm, Cuff Size: Normal)   Pulse 91   Resp 16   Ht 6\' 1"  (1.854 m)   Wt 148 lb (67.1 kg)   SpO2 98%   BMI 19.53 kg/m    Physical Examination:   GENERAL:NAD, no fevers, chills, no weakness no fatigue HEAD: Normocephalic, atraumatic.  EYES: Pupils  equal, round, reactive to light. Extraocular muscles intact. No scleral icterus.  MOUTH: Moist mucosal membrane.   EAR, NOSE, THROAT: Clear without exudates. No external lesions.  NECK: Supple. No thyromegaly. No nodules. No JVD.  PULMONARY:CTA B/L no wheezes, no crackles, no rhonchi CARDIOVASCULAR: S1 and S2. Regular rate and rhythm. No murmurs, rubs, or gallops. No edema.  GASTROINTESTINAL: Soft, nontender, nondistended. No masses. Positive bowel sounds.  MUSCULOSKELETAL: No swelling, clubbing, or edema. Range of motion full in all extremities.  NEUROLOGIC: Cranial nerves II through XII are intact. No gross focal neurological deficits.  SKIN: No ulceration, lesions, rashes, or cyanosis. Skin warm and dry. Turgor intact.  PSYCHIATRIC: Mood, affect within normal limits. The patient is awake, alert and oriented x 3. Insight, judgment intact.      ASSESSMENT / PLAN: 52 year old African-American male with abnormal CT chest with right upper lobe lung nodule with right hilar adenopathy in the setting of tobacco abuse which strongly suggest primary lung cancer with lymphadenopathy clinical stage II  The risks of electromagnetic navigation bronchoscopy and endobronchial ultrasound were explained to the patient I have advised the patient stop taking his NSAIDs  Plan for procedure next week   AFTER FURTHER ASSESSMENT, THE LUNG NODULE IS DEEMED INACCESSIBLE BY STANDARD BRONCHOSCOPIC PROCEDURES, PATIENT IS ALSO A CANDIDATE FOR FIDUCIAL PLACEMENT  and IS NOT A CANDIDATE FOR SURGERY.    Patient/Family are satisfied with Plan of action and management. All questions answered  Corrin Parker, M.D.  [facility]  Medical Director [place] Director [facility] Cardio-Pulmonary Department

## 2018-06-17 NOTE — Patient Instructions (Signed)
  The Risks and Benefits of the Bronchoscopy procedure with ENB were explained to patient/family.  I have discussed the risk for Acute Bleeding, increased chance of Infection, increased chance of Respiratory Failure and Cardiac Arrest, increased chance of pneumothorax and collapsed lung, as well as increased Stroke and Death.  I have also explained to avoid all types of NSAIDs 1 week prior to procedure date  to decrease chance of bleeding, and also to avoid food and drinks the midnight prior to procedure.  The procedure consists of a video camera with a light source to be placed and inserted  into the lungs to  look for abnormal tissue and to obtain tissue samples by using needle and biopsy tools.  The patient/family understand the risks and benefits and have agreed to proceed with procedure.

## 2018-06-18 NOTE — Telephone Encounter (Signed)
06/18/18 at 11:01 am EST spoke with Nori Riis at [facility]. Procedure codes 15947, [phone] and [phone] are valid and billable codes and does not require PA. Call Ref # [id number].   Procedure code [phone] (ENB) is not valid and billable, will not be covered, looked on as an experimental procedure and therefore, will not be covered. Call Ref # [id number]. Rhonda J Cobb

## 2018-06-19 NOTE — Addendum Note (Signed)
Addended by: [name] on: 06/19/2018 10:09 AM   Modules accepted: Orders

## 2018-06-21 ENCOUNTER — Encounter
Admission: RE | Admit: 2018-06-21 | Discharge: 2018-06-21 | Disposition: A | Discharge status: Home / Self Care | Payer: BLUE CROSS/BLUE SHIELD | Source: Ambulatory Visit | Attending: Internal Medicine | Admitting: Internal Medicine

## 2018-06-21 ENCOUNTER — Other Ambulatory Visit: Payer: Self-pay

## 2018-06-21 HISTORY — DX: Emphysema, unspecified: J43.9

## 2018-06-21 HISTORY — DX: Malignant (primary) neoplasm, unspecified: C80.1

## 2018-06-21 HISTORY — DX: Alcohol abuse, uncomplicated: F10.10

## 2018-06-21 HISTORY — DX: Personal history of urinary calculi: Z87.442

## 2018-06-21 HISTORY — DX: Unspecified atherosclerosis: I70.90

## 2018-06-21 NOTE — Patient Instructions (Signed)
Your procedure is scheduled on: Tuesday, June 25, 2018  Report to [facility]     DO NOT STOP ON THE FIRST FLOOR TO REGISTER  To find out your arrival time please call [phone] between 1PM - 3PM on Monday, June 24, 2018  Remember: Instructions that are not followed completely may result in serious medical risk,  up to and including death, or upon the discretion of your surgeon and anesthesiologist your  surgery may need to be rescheduled.     _X__ 1. Do not eat food after midnight the night before your procedure.                 No gum chewing or hard candies.                   ABSOLUTELY NOTHING SOLID IN YOUR MOUTH AFTER MIDNIGHT                  You may drink clear liquids up to 2 hours before you are scheduled to arrive for your surgery-                   DO not drink clear liquids within 2 hours of the start of your surgery.                  Clear Liquids include:  water, apple juice without pulp, clear carbohydrate                 drink such as Clearfast of Gatorade, Black Coffee or Tea (Do not add                 anything to coffee or tea).  __X__2.  On the morning of surgery brush your teeth with toothpaste and water,                    You may rinse your mouth with mouthwash if you wish.                      Do not swallow any toothpaste of mouthwash.     _X__ 3.  No Alcohol for 24 hours before or after surgery.   _X__ 4.  Do Not Smoke or use e-cigarettes For 24 Hours Prior to Your Surgery.                 Do not use any chewable tobacco products for at least 6 hours prior to                 surgery.  ____  5.  Bring all medications with you on the day of surgery if instructed.   ____  6.  Notify your doctor if there is any change in your medical condition      (cold, fever, infections).     Do not wear jewelry, make-up, hairpins, clips or nail polish. Do not wear lotions, powders, or perfumes. You may wear  deodorant. Do not shave 48 hours prior to surgery. Men may shave face and neck. Do not bring valuables to the hospital.    [facility] is not responsible for any belongings or valuables.  Contacts, dentures or bridgework may not be worn into surgery. Leave your suitcase in the car. After surgery it may be brought to your room. For patients admitted to the hospital, discharge time is determined by your treatment team.   Patients discharged the  day of surgery will not be allowed to drive home.   Please read over the following fact sheets that you were given:   PREPARING FOR SURGERY   _X___ Take these medicines the morning of surgery with A SIP OF WATER:    1. AMLODIPINE  2. CLONIDINE  3. HYDRALAZINE  4. BENICAR  5.  6.  ____ Fleet Enema (as directed)   ____ TAKE A SHOWER ON THE MORNING OF SURGERY WITH AN ANTIBACTERIAL SOAP  __X__ Stop ALL ASPIRIN PRODUCTS AS OF TODAY  __X__ Stop Anti-inflammatories AS OF TODAY              THIS INCLUDES IBUPROFEN / MOTRIN / ADVIL / ALEVE / NAPROXEN                    TYLENOL IS ACCEPTABLE TO TAKE AT ANY TIME    ____ Stop supplements until after surgery.    ____ Bring C-Pap to the hospital.   CONTINUE TAKING HYDROCHLOROTHIAZIDE AND POTASSIUM AS USUAL, JUST DO        NOT TAKE ON THE MORNING OF SURGERY

## 2018-06-24 ENCOUNTER — Ambulatory Visit
Admission: RE | Admit: 2018-06-24 | Discharge: 2018-06-24 | Disposition: A | Discharge status: Home / Self Care | Payer: BLUE CROSS/BLUE SHIELD | Source: Ambulatory Visit | Attending: Internal Medicine | Admitting: Internal Medicine

## 2018-06-24 DIAGNOSIS — R911 Solitary pulmonary nodule: Secondary | ICD-10-CM | POA: Insufficient documentation

## 2018-06-25 ENCOUNTER — Ambulatory Visit: Payer: BLUE CROSS/BLUE SHIELD | Admitting: Anesthesiology

## 2018-06-25 ENCOUNTER — Other Ambulatory Visit: Payer: Self-pay

## 2018-06-25 ENCOUNTER — Encounter
Admission: RE | Disposition: A | Discharge status: Home / Self Care | Payer: Self-pay | Source: Home / Self Care | Attending: Internal Medicine

## 2018-06-25 ENCOUNTER — Ambulatory Visit
Admission: RE | Admit: 2018-06-25 | Discharge: 2018-06-25 | Disposition: A | Discharge status: Home / Self Care | Payer: BLUE CROSS/BLUE SHIELD | Attending: Internal Medicine | Admitting: Internal Medicine

## 2018-06-25 DIAGNOSIS — R591 Generalized enlarged lymph nodes: Secondary | ICD-10-CM | POA: Diagnosis not present

## 2018-06-25 DIAGNOSIS — F17210 Nicotine dependence, cigarettes, uncomplicated: Secondary | ICD-10-CM | POA: Diagnosis not present

## 2018-06-25 DIAGNOSIS — I10 Essential (primary) hypertension: Secondary | ICD-10-CM | POA: Insufficient documentation

## 2018-06-25 DIAGNOSIS — R911 Solitary pulmonary nodule: Secondary | ICD-10-CM | POA: Diagnosis not present

## 2018-06-25 DIAGNOSIS — R590 Localized enlarged lymph nodes: Secondary | ICD-10-CM | POA: Diagnosis not present

## 2018-06-25 DIAGNOSIS — Z801 Family history of malignant neoplasm of trachea, bronchus and lung: Secondary | ICD-10-CM | POA: Diagnosis not present

## 2018-06-25 DIAGNOSIS — R599 Enlarged lymph nodes, unspecified: Secondary | ICD-10-CM

## 2018-06-25 DIAGNOSIS — Z79899 Other long term (current) drug therapy: Secondary | ICD-10-CM | POA: Diagnosis not present

## 2018-06-25 HISTORY — PX: ELECTROMAGNETIC NAVIGATION BROCHOSCOPY: SHX5369

## 2018-06-25 HISTORY — PX: ENDOBRONCHIAL ULTRASOUND: SHX5096

## 2018-06-25 SURGERY — ELECTROMAGNETIC NAVIGATION BRONCHOSCOPY
Anesthesia: General | Laterality: Right

## 2018-06-25 MED ORDER — PROPOFOL 10 MG/ML IV BOLUS
INTRAVENOUS | Status: DC | PRN
  Administered 2018-06-25: 160 mg via INTRAVENOUS

## 2018-06-25 MED ORDER — FENTANYL CITRATE (PF) 100 MCG/2ML IJ SOLN: 25.0000 ug | INTRAMUSCULAR | Status: DC | PRN

## 2018-06-25 MED ORDER — FAMOTIDINE 20 MG PO TABS
ORAL_TABLET | ORAL | Status: AC
  Filled 2018-06-25: qty 1

## 2018-06-25 MED ORDER — ONDANSETRON HCL 4 MG/2ML IJ SOLN
INTRAMUSCULAR | Status: DC | PRN
  Administered 2018-06-25: 4 mg via INTRAVENOUS

## 2018-06-25 MED ORDER — PROPOFOL 10 MG/ML IV BOLUS
INTRAVENOUS | Status: AC
  Filled 2018-06-25: qty 20

## 2018-06-25 MED ORDER — PROMETHAZINE HCL 25 MG/ML IJ SOLN: 6.2500 mg | INTRAMUSCULAR | Status: DC | PRN

## 2018-06-25 MED ORDER — ROCURONIUM BROMIDE 50 MG/5ML IV SOLN
INTRAVENOUS | Status: AC
  Filled 2018-06-25: qty 1

## 2018-06-25 MED ORDER — FAMOTIDINE 20 MG PO TABS
20.0000 mg | ORAL_TABLET | Freq: Once | ORAL | Status: AC
  Administered 2018-06-25: 20 mg via ORAL

## 2018-06-25 MED ORDER — ROCURONIUM BROMIDE 100 MG/10ML IV SOLN
INTRAVENOUS | Status: DC | PRN
  Administered 2018-06-25: 30 mg via INTRAVENOUS
  Administered 2018-06-25: 10 mg via INTRAVENOUS

## 2018-06-25 MED ORDER — SUGAMMADEX SODIUM 200 MG/2ML IV SOLN
INTRAVENOUS | Status: DC | PRN
  Administered 2018-06-25: 133.8 mg via INTRAVENOUS

## 2018-06-25 MED ORDER — FENTANYL CITRATE (PF) 100 MCG/2ML IJ SOLN
INTRAMUSCULAR | Status: DC | PRN
  Administered 2018-06-25: 50 ug via INTRAVENOUS

## 2018-06-25 MED ORDER — LACTATED RINGERS IV SOLN
INTRAVENOUS | Status: DC
  Administered 2018-06-25: 12:00:00 via INTRAVENOUS

## 2018-06-25 MED ORDER — FENTANYL CITRATE (PF) 100 MCG/2ML IJ SOLN
INTRAMUSCULAR | Status: AC
  Filled 2018-06-25: qty 2

## 2018-06-25 MED ORDER — LIDOCAINE HCL (CARDIAC) PF 100 MG/5ML IV SOSY
PREFILLED_SYRINGE | INTRAVENOUS | Status: DC | PRN
  Administered 2018-06-25: 80 mg via INTRAVENOUS

## 2018-06-25 MED ORDER — MIDAZOLAM HCL 2 MG/2ML IJ SOLN
INTRAMUSCULAR | Status: AC
  Filled 2018-06-25: qty 2

## 2018-06-25 MED ORDER — PHENYLEPHRINE HCL 10 MG/ML IJ SOLN
INTRAMUSCULAR | Status: DC | PRN
  Administered 2018-06-25 (×3): 100 ug via INTRAVENOUS
  Administered 2018-06-25: 150 ug via INTRAVENOUS

## 2018-06-25 MED ORDER — MIDAZOLAM HCL 2 MG/2ML IJ SOLN
INTRAMUSCULAR | Status: DC | PRN
  Administered 2018-06-25: 2 mg via INTRAVENOUS

## 2018-06-25 NOTE — Transfer of Care (Signed)
Immediate Anesthesia Transfer of Care Note  Patient: Samuel Choi  Procedure(s) Performed: ELECTROMAGNETIC NAVIGATION BRONCHOSCOPY (Right ) ENDOBRONCHIAL ULTRASOUND (Right )  Patient Location:    Anesthesia Type:General  Level of Consciousness: awake and alert   Airway & Oxygen Therapy: Patient Spontanous Breathing and Patient connected to face mask oxygen  Post-op Assessment: Report given to RN and Post -op Vital signs reviewed and stable  Post vital signs: Reviewed and stable  Last Vitals:  Vitals Value Taken Time  BP 144/84 06/25/2018  3:00 PM  Temp    Pulse 86 06/25/2018  3:02 PM  Resp 17 06/25/2018  3:02 PM  SpO2 100 % 06/25/2018  3:02 PM  Vitals shown include unvalidated device data.  Last Pain:  Vitals:   06/25/18 1207  TempSrc: Tympanic  PainSc: 0-No pain         Complications: No apparent anesthesia complications

## 2018-06-25 NOTE — Interval H&P Note (Signed)
History and Physical Interval Note:  06/25/2018 12:42 PM  Samuel Choi  has presented today for surgery, with the diagnosis of RIGHT UPPER LOBE MASS, RIGHT HILAR ADENOPATHY  The various methods of treatment have been discussed with the patient and family. After consideration of risks, benefits and other options for treatment, the patient has consented to  Procedure(s): ELECTROMAGNETIC NAVIGATION BRONCHOSCOPY (Right) ENDOBRONCHIAL ULTRASOUND (Right) as a surgical intervention .  The patient's history has been reviewed, patient examined, no change in status, stable for surgery.  I have reviewed the patient's chart and labs.  Questions were answered to the patient's satisfaction.      The Risks and Benefits of the Bronchoscopy procedure with ENB/EBUS  were explained to patient/family.  I have discussed the risk for Acute Bleeding, increased chance of Infection, increased chance of Respiratory Failure and Cardiac Arrest, increased chance of pneumothorax and collapsed lung, as well as increased Stroke and Death.  I have also explained to avoid all types of NSAIDs 1 week prior to procedure date  to decrease chance of bleeding, and also to avoid food and drinks the midnight prior to procedure.  The procedure consists of a video camera with a light source to be placed and inserted  into the lungs to  look for abnormal tissue and to obtain tissue samples by using needle and biopsy tools.  The patient/family understand the risks and benefits and have agreed to proceed with procedure.   [name]

## 2018-06-25 NOTE — Op Note (Signed)
PROCEDURE: ENDOBRONCHIAL ULTRASOUND   PROCEDURE DATE: 06/25/2018  TIME:  NAME:  Samuel Choi  DOB:1965-10-07  MRN: [id number]     Indications/Preliminary Diagnosis:Adenopathy  Consent: (Place X beside choice/s below)  The benefits, risks and possible complications of the procedure were        explained to:  _x__ patient  _x__ patient's family  ___ other:___________  who verbalized understanding and gave:  ___ verbal  ___ written  __x_ verbal and written  ___ telephone  ___ other:________ consent.      Unable to obtain consent; procedure performed on emergent basis.     Other:       PRESEDATION ASSESSMENT: History and Physical has been performed. Patient meds and allergies have been reviewed. Presedation airway examination has been performed and documented. Baseline vital signs, sedation score, oxygenation status, and cardiac rhythm were reviewed. Patient was deemed to be in satisfactory condition to undergo the procedure.    PREMEDICATIONS: SEE ANESTHESIOLOGY RECORDS   Insertion Route (Place X beside choice below)   Nasal   Oral  x Endotracheal Tube   Tracheostomy   INTRAPROCEDURE MEDICATIONS: SEE ANESTHESIOLOGY RECORDS   PROCEDURE DETAILS: Timeout performed and correct patient, name, & ID confirmed. Following prep per Pulmonary policy, appropriate sedation was administered.  I proceeded with introducing the endobronchial [place] scope and findings, technical procedures, and specimen collection as noted below. At the end of exam the scope was withdrawn without incident. Impression and Plan as noted below.   SPECIMENS (Sites): (Place X beside choice below)  Specimens Description   No Specimens Obtained     Washings    Lavage    Biopsies   x Fine Needle Aspirates 2(subcarinal) 6(RT [place])   Brushings    Sputum    FINDINGS:  1x2 CM subcarinal LN biopsies, 2 samples taken, needle advanced 1.5 cm   RT [place] approx 3x2 CM LN biopsies, 6 samples taken Needle advance 1-2  cm  ESTIMATED BLOOD LOSS: none COMPLICATIONS/RESOLUTION: none       IMPRESSION:POST-PROCEDURE DX: ADENOPATHY    RECOMMENDATION/PLAN:  Follow up Pathology Reports     Corrin Parker, M.D.  [facility]  Medical Director [place] Director [place] Department

## 2018-06-25 NOTE — Anesthesia Procedure Notes (Signed)
Procedure Name: Intubation Date/Time: 06/25/2018 2:10 PM Performed by: Allean Found, CRNA Pre-anesthesia Checklist: Patient identified, Patient being monitored, Timeout performed, Emergency Drugs available and Suction available Patient Re-evaluated:Patient Re-evaluated prior to induction Oxygen Delivery Method: Circle system utilized Preoxygenation: Pre-oxygenation with 100% oxygen Induction Type: IV induction Ventilation: Mask ventilation without difficulty Laryngoscope Size: Mac and 4 Grade View: Grade I Tube type: Oral Tube size: 8.5 mm Number of attempts: 1 Airway Equipment and Method: Stylet Placement Confirmation: ETT inserted through vocal cords under direct vision,  positive ETCO2 and breath sounds checked- equal and bilateral Secured at: 21 cm Tube secured with: Tape Dental Injury: Teeth and Oropharynx as per pre-operative assessment

## 2018-06-25 NOTE — Anesthesia Preprocedure Evaluation (Signed)
Anesthesia Evaluation  Patient identified by MRN, date of birth, ID band Patient awake    Reviewed: Allergy & Precautions, H&P , NPO status , Patient's Chart, lab work & pertinent test results, reviewed documented beta blocker date and time   History of Anesthesia Complications Negative for: history of anesthetic complications  Airway Mallampati: III  TM Distance: >3 FB Neck ROM: full    Dental  (+) Dental Advidsory Given, Missing, Poor Dentition   Pulmonary neg shortness of breath, COPD, neg recent URI, Current Smoker,           Cardiovascular Exercise Tolerance: Good hypertension, (-) angina(-) CAD, (-) Past MI, (-) Cardiac Stents and (-) CABG (-) dysrhythmias (-) Valvular Problems/Murmurs     Neuro/Psych PSYCHIATRIC DISORDERS negative neurological ROS     GI/Hepatic negative GI ROS, Neg liver ROS,   Endo/Other  negative endocrine ROS  Renal/GU Renal disease (kidney stones)  negative genitourinary   Musculoskeletal   Abdominal   Peds  Hematology negative hematology ROS (+)   Anesthesia Other Findings Past Medical History: No date: Alcohol abuse     Comment:  usually drinks 2-3 drinks per day 06/2018: Atherosclerosis 06/2018: Cancer [facility])     Comment:  evaluating for lung cancer 06/2018: Emphysema of lung ([place])     Comment:  patient unaware of this. 05/2018: History of kidney stones     Comment:  per xray, bilateral nephrolitiasis No date: Hypertension   Reproductive/Obstetrics negative OB ROS                             Anesthesia Physical Anesthesia Plan  ASA: III  Anesthesia Plan: General   Post-op Pain Management:    Induction: Intravenous  PONV Risk Score and Plan: 1 and Ondansetron, Dexamethasone, Midazolam, Promethazine and Treatment may vary due to age or medical condition  Airway Management Planned: Oral ETT  Additional Equipment:   Intra-op Plan:    Post-operative Plan: Extubation in OR  Informed Consent: I have reviewed the patients History and Physical, chart, labs and discussed the procedure including the risks, benefits and alternatives for the proposed anesthesia with the patient or authorized representative who has indicated his/her understanding and acceptance.   Dental Advisory Given  Plan Discussed with: Anesthesiologist, CRNA and Surgeon  Anesthesia Plan Comments:         Anesthesia Quick Evaluation

## 2018-06-25 NOTE — Discharge Instructions (Signed)

## 2018-06-25 NOTE — Anesthesia Post-op Follow-up Note (Signed)
Anesthesia QCDR form completed.        

## 2018-06-26 ENCOUNTER — Other Ambulatory Visit: Payer: Self-pay | Admitting: Oncology

## 2018-06-26 ENCOUNTER — Encounter: Payer: Self-pay | Admitting: Internal Medicine

## 2018-06-26 LAB — CYTOLOGY - NON PAP

## 2018-06-26 NOTE — Anesthesia Postprocedure Evaluation (Signed)
Anesthesia Post Note  Patient: Samuel Choi  Procedure(s) Performed: ELECTROMAGNETIC NAVIGATION BRONCHOSCOPY (Right ) ENDOBRONCHIAL ULTRASOUND (Right )  Patient location during evaluation: PACU Anesthesia Type: General Level of consciousness: awake and alert Pain management: pain level controlled Vital Signs Assessment: post-procedure vital signs reviewed and stable Respiratory status: spontaneous breathing, nonlabored ventilation, respiratory function stable and patient connected to nasal cannula oxygen Cardiovascular status: blood pressure returned to baseline and stable Postop Assessment: no apparent nausea or vomiting Anesthetic complications: no     Last Vitals:  Vitals:   06/25/18 1537 06/25/18 1552  BP: 139/83 (!) 164/87  Pulse: 79 81  Resp: 14 14  Temp: (!) 36.2 C   SpO2: 96% 100%    Last Pain:  Vitals:   06/26/18 0853  TempSrc:   PainSc: 0-No pain                 Martha Clan

## 2018-06-27 ENCOUNTER — Other Ambulatory Visit: Payer: Self-pay | Admitting: *Deleted

## 2018-06-27 DIAGNOSIS — R911 Solitary pulmonary nodule: Secondary | ICD-10-CM

## 2018-06-28 ENCOUNTER — Inpatient Hospital Stay: Payer: BLUE CROSS/BLUE SHIELD | Admitting: Oncology

## 2018-07-01 ENCOUNTER — Ambulatory Visit: Payer: BLUE CROSS/BLUE SHIELD | Admitting: Radiation Oncology

## 2018-07-01 ENCOUNTER — Encounter: Payer: Self-pay | Admitting: Oncology

## 2018-07-05 ENCOUNTER — Other Ambulatory Visit: Payer: Self-pay | Admitting: Radiology

## 2018-07-08 ENCOUNTER — Ambulatory Visit
Admission: RE | Admit: 2018-07-08 | Discharge: 2018-07-08 | Disposition: A | Discharge status: Home / Self Care | Payer: BLUE CROSS/BLUE SHIELD | Source: Ambulatory Visit | Attending: Interventional Radiology | Admitting: Interventional Radiology

## 2018-07-08 ENCOUNTER — Ambulatory Visit
Admission: RE | Admit: 2018-07-08 | Discharge: 2018-07-08 | Disposition: A | Discharge status: Home / Self Care | Payer: BLUE CROSS/BLUE SHIELD | Source: Ambulatory Visit | Attending: Oncology | Admitting: Oncology

## 2018-07-08 DIAGNOSIS — R590 Localized enlarged lymph nodes: Secondary | ICD-10-CM | POA: Insufficient documentation

## 2018-07-08 DIAGNOSIS — N200 Calculus of kidney: Secondary | ICD-10-CM | POA: Diagnosis not present

## 2018-07-08 DIAGNOSIS — Z9889 Other specified postprocedural states: Secondary | ICD-10-CM

## 2018-07-08 DIAGNOSIS — I10 Essential (primary) hypertension: Secondary | ICD-10-CM | POA: Insufficient documentation

## 2018-07-08 DIAGNOSIS — F17210 Nicotine dependence, cigarettes, uncomplicated: Secondary | ICD-10-CM | POA: Diagnosis not present

## 2018-07-08 DIAGNOSIS — J8410 Pulmonary fibrosis, unspecified: Secondary | ICD-10-CM | POA: Diagnosis not present

## 2018-07-08 DIAGNOSIS — Z791 Long term (current) use of non-steroidal anti-inflammatories (NSAID): Secondary | ICD-10-CM | POA: Diagnosis not present

## 2018-07-08 DIAGNOSIS — Z79899 Other long term (current) drug therapy: Secondary | ICD-10-CM | POA: Diagnosis not present

## 2018-07-08 DIAGNOSIS — Z803 Family history of malignant neoplasm of breast: Secondary | ICD-10-CM | POA: Diagnosis not present

## 2018-07-08 DIAGNOSIS — R911 Solitary pulmonary nodule: Secondary | ICD-10-CM | POA: Diagnosis present

## 2018-07-08 DIAGNOSIS — J984 Other disorders of lung: Secondary | ICD-10-CM | POA: Insufficient documentation

## 2018-07-08 DIAGNOSIS — J439 Emphysema, unspecified: Secondary | ICD-10-CM | POA: Diagnosis not present

## 2018-07-08 DIAGNOSIS — Z801 Family history of malignant neoplasm of trachea, bronchus and lung: Secondary | ICD-10-CM | POA: Diagnosis not present

## 2018-07-08 DIAGNOSIS — Z8249 Family history of ischemic heart disease and other diseases of the circulatory system: Secondary | ICD-10-CM | POA: Diagnosis not present

## 2018-07-08 LAB — PROTIME-INR
INR: 0.91
Prothrombin Time: 12.2 seconds (ref 11.4–15.2)

## 2018-07-08 LAB — CBC
HCT: 43.8 % (ref 39.0–52.0)
Hemoglobin: 15.1 g/dL (ref 13.0–17.0)
MCH: 34.5 pg — ABNORMAL HIGH (ref 26.0–34.0)
MCHC: 34.5 g/dL (ref 30.0–36.0)
MCV: 100 fL (ref 80.0–100.0)
Platelets: 196 10*3/uL (ref 150–400)
RBC: 4.38 MIL/uL (ref 4.22–5.81)
RDW: 11.5 % (ref 11.5–15.5)
WBC: 14 10*3/uL — ABNORMAL HIGH (ref 4.0–10.5)
nRBC: 0 % (ref 0.0–0.2)

## 2018-07-08 MED ORDER — MIDAZOLAM HCL 2 MG/2ML IJ SOLN
INTRAMUSCULAR | Status: AC | PRN
  Administered 2018-07-08 (×3): 1 mg via INTRAVENOUS

## 2018-07-08 MED ORDER — SODIUM CHLORIDE 0.9 % IV SOLN: INTRAVENOUS | Status: DC

## 2018-07-08 MED ORDER — FENTANYL CITRATE (PF) 100 MCG/2ML IJ SOLN
INTRAMUSCULAR | Status: AC | PRN
  Administered 2018-07-08 (×2): 50 ug via INTRAVENOUS

## 2018-07-08 MED ORDER — FENTANYL CITRATE (PF) 100 MCG/2ML IJ SOLN
INTRAMUSCULAR | Status: AC
  Filled 2018-07-08: qty 4

## 2018-07-08 MED ORDER — LIDOCAINE HCL (PF) 1 % IJ SOLN
INTRAMUSCULAR | Status: AC | PRN
  Administered 2018-07-08: 10 mL

## 2018-07-08 MED ORDER — MIDAZOLAM HCL 5 MG/5ML IJ SOLN
INTRAMUSCULAR | Status: AC
  Filled 2018-07-08: qty 5

## 2018-07-08 NOTE — Discharge Instructions (Signed)
Lung Biopsy, Care After This sheet gives you information about how to care for yourself after your procedure. Your health care provider may also give you more specific instructions depending on the type of biopsy you had. If you have problems or questions, contact your health care provider. What can I expect after the procedure? After the procedure, it is common to have:  A cough.  A sore throat.  Pain where a needle, bronchoscope, or incision was used to collect a biopsy sample (biopsy site).  You may cough up a small amount of bloody sputum for up to several days after your biopsy.  If you cough up more than a TBSP of blood come to the emergency room by EMS.   Follow these instructions at home: Medicines  Take over-the-counter and prescription medicines only as told by your health care provider.  Do not drive for 24 hours if you were given a sedative.  Do not drink alcohol while taking pain medicine.  Do not drive or use heavy machinery while taking prescription pain medicine.  To prevent or treat constipation while you are taking prescription pain medicine, your health care provider may recommend that you: ? Drink enough fluid to keep your urine clear or pale yellow. ? Take over-the-counter or prescription medicines. ? Eat foods that are high in fiber, such as fresh fruits and vegetables, whole grains, and beans. ? Limit foods that are high in fat and processed sugars, such as fried and sweet foods. Activity  If you had an incision during your procedure, avoid activities that may pull the incision site open.  Return to your normal activities tomorrow.   You may shower tomorrow leave band aid on site and pat area dry. Change your band aid after your shower.  You may remove band aid the following day.   Do not scrub or rub your biopsy site or area surrounding it.  If you had an open biopsy:   Follow instructions from your health care provider about how to take care of your  incision. Make sure you: ? Wash your hands with soap and water before you change your bandage (dressing). If soap and water are not available, use hand sanitizer. ? Remove your dressing in 24 hours ? Leave stitches (sutures), skin glue, or adhesive strips in place. These skin closures may need to stay in place for 2 weeks or longer. If adhesive strip edges start to loosen and curl up, you may trim the loose edges. Do not remove adhesive strips completely unless your health care provider tells you to do that.  Check your incision area every day for signs of infection. Check for: ? Redness, swelling, or pain. ? Fluid or blood. ? Warmth. ? Pus or a bad smell. General instructions  It is up to you to get the results of your procedure. Ask your health care provider, or the department that is doing the procedure, when your results will be ready. Contact a health care provider if:  You have a fever.  You have redness, swelling, or pain around your biopsy site.  You have fluid or blood coming from your biopsy site.  Your biopsy site feels warm to the touch.  You have pus or a bad smell coming from your biopsy site. Get help right away if:  You cough up blood.  You have trouble breathing.  You have chest pain. Summary  After the procedure, it is common to have a sore throat and a cough.  Return to  your normal activities as told by your health care provider. Ask your health care provider what activities are safe for you.  Take over-the-counter and prescription medicines only as told by your health care provider.  Report any unusual symptoms to your health care provider. This information is not intended to replace advice given to you by your health care provider. Make sure you discuss any questions you have with your health care provider. Document Released: 07/25/2016 Document Revised: 07/25/2016 Document Reviewed: 07/25/2016 Elsevier Interactive Patient Education  2018 [employer]Moderate Conscious Sedation, Adult, Care After These instructions provide you with information about caring for yourself after your procedure. Your health care provider may also give you more specific instructions. Your treatment has been planned according to current medical practices, but problems sometimes occur. Call your health care provider if you have any problems or questions after your procedure. What can I expect after the procedure? After your procedure, it is common:  To feel sleepy for several hours.  To feel clumsy and have poor balance for several hours.  To have poor judgment for several hours.  To vomit if you eat too soon. Follow these instructions at home: For at least 24 hours after the procedure:   Do not: ? Participate in activities where you could fall or become injured. ? Drive. ? Use heavy machinery. ? Drink alcohol. ? Take sleeping pills or medicines that cause drowsiness. ? Make important decisions or sign legal documents. ? Take care of children on your own.  Rest. Eating and drinking  Follow the diet recommended by your health care provider.  If you vomit: ? Drink water, juice, or soup when you can drink without vomiting. ? Make sure you have little or no nausea before eating solid foods. General instructions  Have a responsible adult stay with you until you are awake and alert.  Take over-the-counter and prescription medicines only as told by your health care provider.  If you smoke, do not smoke without supervision.  Keep all follow-up visits as told by your health care provider. This is important. Contact a health care provider if:  You keep feeling nauseous or you keep vomiting.  You feel light-headed.  You develop a rash.  You have a fever. Get help right away if:  You have trouble breathing. This information is not intended to replace advice given to you by your health care provider. Make sure you discuss any questions you  have with your health care provider. Document Released: 04/16/2013 Document Revised: 11/29/2015 Document Reviewed: 10/16/2015 Elsevier Interactive Patient Education  2019 [place] Biopsy, Care After This sheet gives you information about how to care for yourself after your procedure. Your health care provider may also give you more specific instructions depending on the type of biopsy you had. If you have problems or questions, contact your health care provider. What can I expect after the procedure? After the procedure, it is common to have:  A cough.  A sore throat.  Pain where a needle, bronchoscope, or incision was used to collect a biopsy sample (biopsy site). Follow these instructions at home: Medicines   Take over-the-counter and prescription medicines only as told by your health care provider.  Do not drive for 24 hours if you were given a sedative.  Do not drink alcohol while taking pain medicine.  Do not drive or use heavy machinery while taking prescription pain medicine.  To prevent or treat constipation while you are taking prescription pain medicine, your  health care provider may recommend that you: ? Drink enough fluid to keep your urine clear or pale yellow. ? Take over-the-counter or prescription medicines. ? Eat foods that are high in fiber, such as fresh fruits and vegetables, whole grains, and beans. ? Limit foods that are high in fat and processed sugars, such as fried and sweet foods. Activity  If you had an incision during your procedure, avoid activities that may pull the incision site open.  Return to your normal activities as told by your health care provider. Ask your health care provider what activities are safe for you. If you had an open biopsy:   Follow instructions from your health care provider about how to take care of your incision. Make sure you: ? Wash your hands with soap and water before you change your bandage (dressing). If  soap and water are not available, use hand sanitizer. ? Change your dressing as told by your health care provider. ? Leave stitches (sutures), skin glue, or adhesive strips in place. These skin closures may need to stay in place for 2 weeks or longer. If adhesive strip edges start to loosen and curl up, you may trim the loose edges. Do not remove adhesive strips completely unless your health care provider tells you to do that.  Check your incision area every day for signs of infection. Check for: ? Redness, swelling, or pain. ? Fluid or blood. ? Warmth. ? Pus or a bad smell. General instructions  It is up to you to get the results of your procedure. Ask your health care provider, or the department that is doing the procedure, when your results will be ready. Contact a health care provider if:  You have a fever.  You have redness, swelling, or pain around your biopsy site.  You have fluid or blood coming from your biopsy site.  Your biopsy site feels warm to the touch.  You have pus or a bad smell coming from your biopsy site. Get help right away if:  You cough up blood.  You have trouble breathing.  You have chest pain. Summary  After the procedure, it is common to have a sore throat and a cough.  Return to your normal activities as told by your health care provider. Ask your health care provider what activities are safe for you.  Take over-the-counter and prescription medicines only as told by your health care provider.  Report any unusual symptoms to your health care provider. This information is not intended to replace advice given to you by your health care provider. Make sure you discuss any questions you have with your health care provider. Document Released: 07/25/2016 Document Revised: 07/25/2016 Document Reviewed: 07/25/2016 Elsevier Interactive Patient Education  2019 [employer].

## 2018-07-08 NOTE — H&P (Signed)
Chief Complaint: Patient was seen in consultation today for No chief complaint on file.  at the request of Yu,Zhou  Referring Physician(s): Yu,Zhou  Supervising Physician: Marybelle Killings  Patient Status: [facility] - Out-pt  History of Present Illness: Samuel Choi is a 52 y.o. male with a right upper lobe lung nodule and right hilar adenopathy.  He underwent bronchoscopy.  Cytology was negative.  He was referred for percutaneous lung biopsy.  Today he feels well.  He denies any chest pain or shortness of breath.  PET/CT demonstrates that the right upper lobe nodule and right hilar adenopathy are hypermetabolic.  He does have a smoking history and continues to smoke.  Past Medical History:  Diagnosis Date  . Alcohol abuse    usually drinks 2-3 drinks per day  . Atherosclerosis 06/2018  . Cancer ([place]) 06/2018   evaluating for lung cancer  . Emphysema of lung ([place]) 06/2018   patient unaware of this.  . History of kidney stones 05/2018   per xray, bilateral nephrolitiasis  . Hypertension     Past Surgical History:  Procedure Laterality Date  . BRAIN SURGERY  10/2017   nasal/sinus endoscopy. mass benign  . ELECTROMAGNETIC NAVIGATION BROCHOSCOPY Right 06/25/2018   Procedure: ELECTROMAGNETIC NAVIGATION BRONCHOSCOPY;  Surgeon: [name], MD;  Location: [facility] ORS;  Service: Cardiopulmonary;  Laterality: Right;  . ENDOBRONCHIAL ULTRASOUND Right 06/25/2018   Procedure: ENDOBRONCHIAL ULTRASOUND;  Surgeon: [name], MD;  Location: [facility] ORS;  Service: Cardiopulmonary;  Laterality: Right;  . TOE SURGERY Left    pin in left toe    Allergies: Patient has no known allergies.  Medications: Prior to Admission medications   Medication Sig Start Date End Date Taking? Authorizing Provider  acetaminophen (TYLENOL) 500 MG tablet Take 500 mg by mouth every 6 (six) hours as needed.   Yes [provider]  amLODipine (NORVASC) 10 MG tablet Take 10 mg by mouth daily.  08/28/17  Yes  [provider]  cloNIDine (CATAPRES) 0.1 MG tablet Take 0.1 mg by mouth 2 (two) times daily.  05/02/18  Yes [provider]  hydrALAZINE (APRESOLINE) 100 MG tablet Take 100 mg by mouth 2 (two) times daily.    Yes [provider]  hydrochlorothiazide (HYDRODIURIL) 12.5 MG tablet Take 12.5 mg by mouth daily.  09/27/17  Yes [provider]  olmesartan (BENICAR) 40 MG tablet Take 40 mg by mouth daily.  08/28/17  Yes [provider]  potassium chloride SA (K-DUR,KLOR-CON) 20 MEQ tablet Take 20 mEq by mouth 2 (two) times daily.   Yes [provider]  HYDROcodone-acetaminophen (NORCO/VICODIN) 5-325 MG tablet Take 1 tablet by mouth every 4 (four) hours as needed for moderate pain. Patient not taking: Reported on 06/17/2018 06/03/18   Lavonia Drafts, MD  naproxen (NAPROSYN) 500 MG tablet Take 1 tablet (500 mg total) by mouth 2 (two) times daily with a meal. Patient not taking: Reported on 06/17/2018 06/03/18   Lavonia Drafts, MD     Family History  Problem Relation Age of Onset  . Breast cancer Mother   . Diabetes Mother   . Lung cancer Father   . Hypertension Father     Social History   Socioeconomic History  . Marital status: Married    Spouse name: lisa  . Number of children: Not on file  . Years of education: Not on file  . Highest education level: Not on file  Occupational History  . Occupation: welding    Comment: inhales chemicals  at work  Social Needs  . Financial resource strain: Not on file  . Food insecurity:    Worry: Not on file    Inability: Not on file  . Transportation needs:    Medical: Not on file    Non-medical: Not on file  Tobacco Use  . Smoking status: Current Every Day Smoker    Packs/day: 0.50    Years: 15.00    Pack years: 7.50    Types: Cigarettes  . Smokeless tobacco: Never Used  . Tobacco comment: attempting to cut back.  Substance and Sexual Activity  . Alcohol use: Yes    Alcohol/week: 3.0  standard drinks    Types: 3 Cans of beer per week    Comment: usually 2 drinks a day  . Drug use: No  . Sexual activity: Not on file  Lifestyle  . Physical activity:    Days per week: Not on file    Minutes per session: Not on file  . Stress: Not on file  Relationships  . Social connections:    Talks on phone: Not on file    Gets together: Not on file    Attends religious service: Not on file    Active member of club or organization: Not on file    Attends meetings of clubs or organizations: Not on file    Relationship status: Not on file  Other Topics Concern  . Not on file  Social History Narrative  . Not on file     Review of Systems: A 12 point ROS discussed and pertinent positives are indicated in the HPI above.  All other systems are negative.  Review of Systems  Vital Signs: BP (!) 157/74   Pulse 97   Temp 98.5 F (36.9 C) (Oral)   Resp 16   Ht 6\' 1"  (1.854 m)   Wt 69.4 kg   SpO2 98%   BMI 20.19 kg/m   Physical Exam Constitutional:      Appearance: Normal appearance.  HENT:     Head: Normocephalic and atraumatic.  Cardiovascular:     Rate and Rhythm: Normal rate and regular rhythm.  Pulmonary:     Effort: Pulmonary effort is normal.     Breath sounds: Normal breath sounds.  Neurological:     General: No focal deficit present.     Mental Status: He is alert and oriented to person, place, and time.     Imaging: Nm Pet Image Initial (pi) Skull Base To Thigh  Result Date: 06/13/2018 CLINICAL DATA:  Initial treatment strategy for right upper lobe pulmonary nodule and borderline right hilar adenopathy on chest CT. Asymptomatic. EXAM: NUCLEAR MEDICINE PET SKULL BASE TO THIGH TECHNIQUE: 7.6 mCi F-18 FDG was injected intravenously. Full-ring PET imaging was performed from the skull base to thigh after the radiotracer. CT data was obtained and used for attenuation correction and anatomic localization. Fasting blood glucose: 85 mg/dl COMPARISON:  Chest CT  06/03/2018.  Abdominopelvic CT 05/09/2018. FINDINGS: Mediastinal blood pool activity: SUV max 2.0 NECK: A focus of hypermetabolism within the deep left parotid gland may correspond to a 9 mm nodule. This area is artifact degraded secondary to earrings. This measures a S.U.V. max of 3.5. No cervical nodal hypermetabolism. Incidental CT findings: No cervical adenopathy. Right carotid atherosclerosis. Fluid in the right maxillary sinus. CHEST: Right upper lobe pulmonary nodule measures 1.2 cm and a S.U.V. max of 9.0 on image 90/4. Hypermetabolism corresponding to the right hilar lymph node. This measures a S.U.V.  max of 7.7 including on image 110/4. Incidental CT findings: Deferred to recent diagnostic CT. Aortic atherosclerosis. Mild centrilobular emphysema. ABDOMEN/PELVIS: No abdominopelvic parenchymal or nodal hypermetabolism. Incidental CT findings: Mild left adrenal thickening. Normal right adrenal gland. Bilateral punctate renal collecting system calculi. Abdominal aortic and branch vessel atherosclerosis. Moderate amount of stool in the rectum. Extensive colonic diverticulosis. SKELETON: Right proximal femoral hypermetabolism is likely posttraumatic, given the appearance on dedicated CT. No suspicious osseous hypermetabolism. Incidental CT findings: none IMPRESSION: 1. Right upper lobe hypermetabolic pulmonary nodule with right hilar hypermetabolic adenopathy. 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 2. Mildly hypermetabolic focus in the deep left parotid gland is likely correlated with a 9 mm nodule. Suspicious for synchronous primary neoplasm (benign or malignant). Suboptimally evaluated secondary to beam hardening artifact in this region. Consider dedicated neck CT. 3. Right femoral hypermetabolism, likely due to a nondisplaced fracture on prior dedicated CT. 4. Aortic atherosclerosis (ICD10-I70.0) and emphysema (ICD10-J43.9). 5.  Bilateral nephrolithiasis. Electronically Signed   By: Abigail Miyamoto M.D.   On: 06/13/2018 14:57   Ct Super D Chest Wo Contrast  Result Date: 06/24/2018 CLINICAL DATA:  Lung nodule, former smoker, quit 3 weeks ago. EXAM: CT CHEST WITHOUT CONTRAST TECHNIQUE: Multidetector CT imaging of the chest was performed using thin slice collimation for electromagnetic bronchoscopy planning purposes, without intravenous contrast. COMPARISON:  PET 06/13/2018 and CT chest 06/03/2018. FINDINGS: Cardiovascular: Atherosclerotic calcification of the aorta and coronary arteries. Heart size normal. No pericardial effusion. Mediastinum/Nodes: No pathologically enlarged mediastinal or axillary lymph nodes. Hilar regions are difficult to evaluate without IV contrast. Esophagus is grossly unremarkable. Lungs/Pleura: A 10 x 11 mm nodule in the anterior right upper lobe (series 3, image 19) is stable in size from 06/03/2018. There is a minimal amount of surrounding ground-glass. 2 mm peripheral right lower lobe nodule (image 44), unchanged. Lungs are otherwise clear. No pleural fluid. Airway is unremarkable. Upper Abdomen: Visualized portions of the liver, gallbladder and adrenal glands are unremarkable. Tiny stones are seen in the kidneys bilaterally. Visualized portions of the spleen, pancreas, stomach and bowel are grossly unremarkable. Musculoskeletal: No worrisome lytic or sclerotic lesions. IMPRESSION: 1. Right upper lobe nodule, unchanged from 06/03/2018 and shown to be hypermetabolic on 81/27/5170, most indicative of primary bronchogenic carcinoma. Known right hilar adenopathy is poorly visualized without IV contrast. 2. Bilateral renal stones. 3. Aortic atherosclerosis (ICD10-170.0). Coronary artery calcification. Electronically Signed   By: Lorin Picket M.D.   On: 06/24/2018 08:54    Labs:  CBC: Recent Labs    08/23/17 2051 06/03/18 1541 07/08/18 0803  WBC 10.9* 12.9* 14.0*  HGB 14.7 15.3 15.1  HCT 43.0 47.2 43.8   PLT 160 139* 196    COAGS: Recent Labs    06/03/18 1541 07/08/18 0803  INR 1.04 0.91  APTT 35  --     BMP: Recent Labs    06/03/18 1541  NA 142  K 3.8  CL 106  CO2 23  GLUCOSE 67*  BUN 14  CALCIUM 9.4  CREATININE 0.70  GFRNONAA >60  GFRAA >60    LIVER FUNCTION TESTS: No results for input(s): BILITOT, AST, ALT, ALKPHOS, PROT, ALBUMIN in the last 8760 hours.  TUMOR MARKERS: No results for input(s): AFPTM, CEA, CA199, CHROMGRNA in the last 8760 hours.  Assessment and Plan:  Right upper lobe lung nodule.  CT guided lung biopsy to follow.  Thank you for this interesting consult.  I greatly enjoyed meeting [place]  Arnoldo Hooker and look forward to participating in their care.  A copy of this report was sent to the requesting provider on this date.  Electronically Signed: Art A Artesha Wemhoff, MD 07/08/2018, 8:59 AM   I spent a total of  40 Minutes   in face to face in clinical consultation, greater than 50% of which was counseling/coordinating care for lung biopsy.

## 2018-07-08 NOTE — Progress Notes (Signed)
Portable CxR done now. Pt. Without any c/o SOB, N/V, dizziness, HA. A&Ox3. Family x 3 at bedside with pt.

## 2018-07-08 NOTE — Procedures (Signed)
RUL lung nodule Bx 18 g core times two EBL 0 Comp 0

## 2018-07-11 ENCOUNTER — Inpatient Hospital Stay: Payer: BLUE CROSS/BLUE SHIELD | Admitting: Oncology

## 2018-07-11 ENCOUNTER — Inpatient Hospital Stay: Payer: BLUE CROSS/BLUE SHIELD

## 2018-07-11 NOTE — Progress Notes (Signed)
Tumor Board Documentation  AYVIN LIPINSKI was presented by Dr Tasia Catchings at our Tumor Board on 07/11/2018, which included representatives from medical oncology, radiation oncology, surgical oncology, surgical, radiology, pathology, navigation, research, pulmonology.  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  Additionally, we reviewed previous medical and familial history, history of present illness, and recent lab results along with all available histopathologic and imaging studies. The tumor board considered available treatment options and made the following recommendations: Radiation therapy (primary modality), Surgery, Re present at conference next week once path is back    The following procedures/referrals were also placed: No orders of the defined types were placed in this encounter.   Clinical Trial Status: not discussed   Staging used: AJCC Stage Group  National site-specific guidelines NCCN were discussed with respect to the case.  Tumor board is a meeting of clinicians from various specialty areas who evaluate and discuss patients for whom a multidisciplinary approach is being considered. Final determinations in the plan of care are those of the provider(s). The responsibility for follow up of recommendations given during tumor board is that of the provider.   Today's extended care, comprehensive team conference, Jarett was not present for the discussion and was not examined.   Multidisciplinary Tumor Board is a multidisciplinary case peer review process.  Decisions discussed in the Multidisciplinary Tumor Board reflect the opinions of the specialists present at the conference without having examined the patient.  Ultimately, treatment and diagnostic decisions rest with the primary provider(s) and the patient.

## 2018-07-15 ENCOUNTER — Other Ambulatory Visit: Payer: Self-pay | Admitting: Anatomic Pathology & Clinical Pathology

## 2018-07-15 ENCOUNTER — Inpatient Hospital Stay: Payer: BLUE CROSS/BLUE SHIELD | Admitting: Oncology

## 2018-07-15 ENCOUNTER — Ambulatory Visit (INDEPENDENT_AMBULATORY_CARE_PROVIDER_SITE_OTHER): Payer: BLUE CROSS/BLUE SHIELD | Admitting: Internal Medicine

## 2018-07-15 ENCOUNTER — Encounter: Payer: Self-pay | Admitting: Internal Medicine

## 2018-07-15 VITALS — BP 126/82 | HR 100 | Ht 73.0 in | Wt 148.0 lb

## 2018-07-15 DIAGNOSIS — R918 Other nonspecific abnormal finding of lung field: Secondary | ICD-10-CM | POA: Diagnosis not present

## 2018-07-15 DIAGNOSIS — F17210 Nicotine dependence, cigarettes, uncomplicated: Secondary | ICD-10-CM | POA: Diagnosis not present

## 2018-07-15 DIAGNOSIS — J449 Chronic obstructive pulmonary disease, unspecified: Secondary | ICD-10-CM

## 2018-07-15 DIAGNOSIS — Z716 Tobacco abuse counseling: Secondary | ICD-10-CM

## 2018-07-15 LAB — SURGICAL PATHOLOGY

## 2018-07-15 NOTE — Patient Instructions (Signed)
STOP SMOKING!!!!!!

## 2018-07-15 NOTE — Progress Notes (Signed)
Name: Samuel Choi MRN: [id number] DOB: Jan 31, 1966     CONSULTATION DATE: 06/17/2018 REFERRING MD : Tasia Catchings  CHIEF COMPLAINT: follow up Abnormal CT chest  STUDIES:   06/03/2018 CT chest Independently reviewed by Me today Right upper lobe nodule seen on CT chest Right hilar lymphadenopathy   HISTORY OF PRESENT ILLNESS: CT chest reviewed agagin with patient  RT hilar EBUS FNA was NEGATIVE  S/p CT guided Biopsy RUL 12/20-no results in chart  Still smoking no SOB, wheezing, cough  No signs of infection COPD seems stable   Still awaiting final pathology reports from RUL biopsy     Smoking Assessment and Cessation Counseling   Upon further questioning, Patient smokes 1/2 PPD  I have advised patient to quit/stop smoking as soon as possible due to high risk for multiple medical problems  Patient  is NOT willing to quit smoking  I have advised patient that we can assist and have options of Nicotine replacement therapy. I also advised patient on behavioral therapy and can provide oral medication therapy in conjunction with the other therapies  Follow up next Office visit  for assessment of smoking cessation  Smoking cessation counseling advised for 4 minutes      Review of Systems:  Gen:  Denies  fever, sweats, chills weigh loss  HEENT: Denies blurred vision, double vision, ear pain, eye pain, hearing loss, nose bleeds, sore throat Cardiac:  No dizziness, chest pain or heaviness, chest tightness,edema, No JVD Resp:   No cough, -sputum production, -shortness of breath,-wheezing, -hemoptysis,  Gi: Denies swallowing difficulty, stomach pain, nausea or vomiting, diarrhea, constipation, bowel incontinence Gu:  Denies bladder incontinence, burning urine Ext:   Denies Joint pain, stiffness or swelling Skin: Denies  skin rash, easy bruising or bleeding or hives Endoc:  Denies polyuria, polydipsia , polyphagia or weight change Psych:   Denies depression, insomnia or  hallucinations  Other:  All other systems negative   BP 126/82   Pulse 100   Ht 6\' 1"  (1.854 m)   Wt 148 lb (67.1 kg)   SpO2 99%   BMI 19.53 kg/m    Physical Examination:   GENERAL:NAD, no fevers, chills, no weakness no fatigue HEAD: Normocephalic, atraumatic.  EYES: Pupils equal, round, reactive to light. Extraocular muscles intact. No scleral icterus.  MOUTH: Moist mucosal membrane. Dentition intact. No abscess noted.  EAR, NOSE, THROAT: Clear without exudates. No external lesions.  NECK: Supple. No thyromegaly. No nodules. No JVD.  PULMONARY: CTA B/L no wheezing, rhonchi, crackles CARDIOVASCULAR: S1 and S2. Regular rate and rhythm. No murmurs, rubs, or gallops. No edema. Pedal pulses 2+ bilaterally.  GASTROINTESTINAL: Soft, nontender, nondistended. No masses. Positive bowel sounds. No hepatosplenomegaly.  MUSCULOSKELETAL: No swelling, clubbing, or edema. Range of motion full in all extremities.  NEUROLOGIC: Cranial nerves II through XII are intact. No gross focal neurological deficits. Sensation intact. Reflexes intact.  SKIN: No ulceration, lesions, rashes, or cyanosis. Skin warm and dry. Turgor intact.  PSYCHIATRIC: Mood, affect within normal limits. The patient is awake, alert and oriented x 3. Insight, judgment intact.  ALL OTHER ROS ARE NEGATIVE         ASSESSMENT / PLAN:  53 year old pleasant African-American male seen today for follow-up abnormal CT chest with right upper lobe lung nodule with right hilar adenopathy in the setting of chronic tobacco abuse which strongly suggest primary lung cancer with lymphadenopathy clinical stage II  Patient underwent EBUS-FNA samples were negative for malignancy Patient underwent CT-guided right upper lobe  lung biopsy on 07/08/2018 Pathology results are still pending  Patient has underlying COPD which seems to be mild at this time Gold stage A No indication for inhalers at this time No indication for steroids No signs of  COPD exacerbation    Smoking cessation strongly advised   Patient/Family are satisfied with Plan of action and management. All questions answered Follow-up in 6 months   Johanna Matto Patricia Pesa, M.D.  [facility]  Medical Director [place] Director [facility] Cardio-Pulmonary Department

## 2018-07-18 ENCOUNTER — Other Ambulatory Visit: Payer: Self-pay

## 2018-07-18 ENCOUNTER — Inpatient Hospital Stay (HOSPITAL_BASED_OUTPATIENT_CLINIC_OR_DEPARTMENT_OTHER): Payer: BLUE CROSS/BLUE SHIELD | Admitting: Oncology

## 2018-07-18 ENCOUNTER — Inpatient Hospital Stay: Payer: BLUE CROSS/BLUE SHIELD | Attending: Oncology

## 2018-07-18 ENCOUNTER — Encounter: Payer: Self-pay | Admitting: Oncology

## 2018-07-18 VITALS — BP 108/60 | HR 102 | Temp 96.2°F | Resp 18 | Wt 146.3 lb

## 2018-07-18 DIAGNOSIS — I10 Essential (primary) hypertension: Secondary | ICD-10-CM

## 2018-07-18 DIAGNOSIS — R599 Enlarged lymph nodes, unspecified: Secondary | ICD-10-CM | POA: Insufficient documentation

## 2018-07-18 DIAGNOSIS — R918 Other nonspecific abnormal finding of lung field: Secondary | ICD-10-CM

## 2018-07-18 DIAGNOSIS — Z720 Tobacco use: Secondary | ICD-10-CM | POA: Insufficient documentation

## 2018-07-18 DIAGNOSIS — R590 Localized enlarged lymph nodes: Secondary | ICD-10-CM

## 2018-07-18 DIAGNOSIS — Z803 Family history of malignant neoplasm of breast: Secondary | ICD-10-CM | POA: Diagnosis not present

## 2018-07-18 DIAGNOSIS — Z801 Family history of malignant neoplasm of trachea, bronchus and lung: Secondary | ICD-10-CM | POA: Diagnosis not present

## 2018-07-18 DIAGNOSIS — R911 Solitary pulmonary nodule: Secondary | ICD-10-CM

## 2018-07-18 NOTE — Progress Notes (Signed)
Patient here for follow up. No concerns voiced.  °

## 2018-07-18 NOTE — Progress Notes (Signed)
Tumor Board Documentation  Samuel Choi was presented by Dr Tasia Catchings at our Tumor Board on 07/18/2018, which included representatives from medical oncology, radiation oncology, surgical oncology, internal medicine, navigation, pathology, radiology, surgical, research, palliative care, pulmonology.  Samuel Choi currently presents for discussion with history of the following treatments: surgical intervention(s).  Additionally, we reviewed previous medical and familial history, history of present illness, and recent lab results along with all available histopathologic and imaging studies. The tumor board considered available treatment options and made the following recommendations:   Refer to Dr Genevive Bi for surgical evaluation as path has come back negative He staes he will see him tomorrow. If this is a cancer, it is a Stage 2 and possible that biopsy has missed the cancer. Patient following with Palliative Care also. Has appointment with Med Onc 1/9  The following procedures/referrals were also placed: No orders of the defined types were placed in this encounter.   Clinical Trial Status: not discussed   Staging used:    National site-specific guidelines NCCN were discussed with respect to the case.  Tumor board is a meeting of clinicians from various specialty areas who evaluate and discuss patients for whom a multidisciplinary approach is being considered. Final determinations in the plan of care are those of the provider(s). The responsibility for follow up of recommendations given during tumor board is that of the provider.   Today's extended care, comprehensive team conference, Samuel Choi was not present for the discussion and was not examined.   Multidisciplinary Tumor Board is a multidisciplinary case peer review process.  Decisions discussed in the Multidisciplinary Tumor Board reflect the opinions of the specialists present at the conference without having examined the patient.  Ultimately, treatment and  diagnostic decisions rest with the primary provider(s) and the patient.

## 2018-07-18 NOTE — Progress Notes (Signed)
Hematology/Oncology Consult note [facility] Telephone:([phone] [phone]   Patient Care Team: Tracie Harrier, MD as PCP - General (Internal Medicine) [name], RN as Registered Nurse  REFERRING PROVIDER: ED physician CHIEF COMPLAINTS/REASON FOR VISIT:  Evaluation of lung mass  HISTORY OF PRESENTING ILLNESS:  Samuel Choi is a  53 y.o.  male with PMH listed below who was referred to me for evaluation of lung mass. Patient recently presented to emergency room complaining of right hip pain.  He reports that he had a fell 3 days prior to the presentation, tripped on curb and fell onto his right hip.  Significant right hip pain since then. Patient also went to urgent care today and x-ray demonstrated right hip fracture.  Was sent to emergency room.  Also has a history of uncontrolled high blood pressure recently had abdomen angiogram done which showed no evidence of renal artery stenosis or other vascular findings. Patient had a CT scan of the hip demonstrate partial trochanter fracture.  Patient was advised to follow-up outpatient. Chest x-ray was done on 06/03/2018 which showed 50 mm mass in the right upper lobe.  And also possible right-sided mediastinal adenopathy.  CT chest with contrast was obtained in the ED which showed a 1.3 cm slightly shaggy marginated pulmonary nodule, with right hilar adenopathy.  Appearance concerning for lung malignancy.  Patient was advised to follow-up at cancer center for further management. Today patient was accompanied by his wife.  He has no respiratory symptoms.  Denies any chest pain, shortness of breath, cough, hemoptysis, or any weight loss. He drinks beer every other day chronically. Daily smoker, 7.5-pack-year smoking history.  INTERVAL HISTORY Samuel Choi is a 53 y.o. male who has above history reviewed by me today presents for follow up visit for management of lung mass.  During the interval,  patient has had below work up.  I have independently reviewed patient's image work up and pathology results.  #06/13/2018 PET scan  1. Right upper lobe hypermetabolic pulmonary nodule with right hilar hypermetabolic adenopathy. 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 2. Mildly hypermetabolic focus in the deep left parotid gland is likely correlated with a 9 mm nodule. Suspicious for synchronous primary neoplasm (benign or malignant). Suboptimally evaluated secondary to beam hardening artifact in this region. Consider dedicated neck CT.  3. Right femoral hypermetabolism, likely due to a nondisplaced fracture on prior dedicated CT. 4. Aortic atherosclerosis (ICD10-I70.0) and emphysema (ICD10-J43.9).5. Bilateral nephrolithiasis.Problems and complaints are listed below:  #he was referred to pulmonology and was seen by [facility]. s/p Bronchoscopy on 06/25/2018. subcarina EBUS FNA was non diagnostic, hypocellular specimen.  # CT guided right upper lobe biopsy pathology showed dense fibrosis and mixed inflammatory cells with prominent polytypic plasma cells component. Focal benign bronchial wall and alveolar spaces. No malignancy was identified.   Patient presents to discuss pathology result and further management plan. Denies any SOB, cough, chest pain. Weight loss. Right hip pain has improved.   Review of Systems  Constitutional: Negative for appetite change, chills, fatigue, fever and unexpected weight change.  HENT:   Negative for hearing loss and voice change.   Eyes: Negative for eye problems and icterus.  Respiratory: Negative for chest tightness, cough and shortness of breath.   Cardiovascular: Negative for chest pain and leg swelling.  Gastrointestinal: Negative for abdominal distention and abdominal pain.  Endocrine: Negative for hot flashes.  Genitourinary: Negative for difficulty urinating, dysuria and  frequency.     Musculoskeletal: Negative for arthralgias.  Skin: Negative for itching and rash.  Neurological: Negative for light-headedness and numbness.  Hematological: Negative for adenopathy. Does not bruise/bleed easily.  Psychiatric/Behavioral: Negative for confusion.    MEDICAL HISTORY:  Past Medical History:  Diagnosis Date  . Alcohol abuse    usually drinks 2-3 drinks per day  . Atherosclerosis 06/2018  . Cancer ([place]) 06/2018   evaluating for lung cancer  . Emphysema of lung ([place]) 06/2018   patient unaware of this.  . History of kidney stones 05/2018   per xray, bilateral nephrolitiasis  . Hypertension     SURGICAL HISTORY: Past Surgical History:  Procedure Laterality Date  . BRAIN SURGERY  10/2017   nasal/sinus endoscopy. mass benign  . ELECTROMAGNETIC NAVIGATION BROCHOSCOPY Right 06/25/2018   Procedure: ELECTROMAGNETIC NAVIGATION BRONCHOSCOPY;  Surgeon: [name], MD;  Location: [facility] ORS;  Service: Cardiopulmonary;  Laterality: Right;  . ENDOBRONCHIAL ULTRASOUND Right 06/25/2018   Procedure: ENDOBRONCHIAL ULTRASOUND;  Surgeon: [name], MD;  Location: [facility] ORS;  Service: Cardiopulmonary;  Laterality: Right;  . TOE SURGERY Left    pin in left toe    SOCIAL HISTORY: Social History   Socioeconomic History  . Marital status: Married    Spouse name: lisa  . Number of children: Not on file  . Years of education: Not on file  . Highest education level: Not on file  Occupational History  . Occupation: welding    Comment: inhales chemicals at work  Social Needs  . Financial resource strain: Not on file  . Food insecurity:    Worry: Not on file    Inability: Not on file  . Transportation needs:    Medical: Not on file    Non-medical: Not on file  Tobacco Use  . Smoking status: Current Every Day Smoker    Packs/day: 0.50    Years: 15.00    Pack years: 7.50    Types: Cigarettes  . Smokeless tobacco: Never Used  . Tobacco comment: attempting to cut back.   Substance and Sexual Activity  . Alcohol use: Yes    Alcohol/week: 3.0 standard drinks    Types: 3 Cans of beer per week    Comment: usually 2 drinks a day  . Drug use: No  . Sexual activity: Not on file  Lifestyle  . Physical activity:    Days per week: Not on file    Minutes per session: Not on file  . Stress: Not on file  Relationships  . Social connections:    Talks on phone: Not on file    Gets together: Not on file    Attends religious service: Not on file    Active member of club or organization: Not on file    Attends meetings of clubs or organizations: Not on file    Relationship status: Not on file  . Intimate partner violence:    Fear of current or ex partner: Not on file    Emotionally abused: Not on file    Physically abused: Not on file    Forced sexual activity: Not on file  Other Topics Concern  . Not on file  Social History Narrative  . Not on file    FAMILY HISTORY: Family History  Problem Relation Age of Onset  . Breast cancer Mother   . Diabetes Mother   . Lung cancer Father   . Hypertension Father     ALLERGIES:  has No Known Allergies.  MEDICATIONS:  Current Outpatient Medications  Medication Sig Dispense Refill  . acetaminophen (TYLENOL) 500 MG tablet Take 500 mg by mouth every 6 (six) hours as needed.    Marland Kitchen amLODipine (NORVASC) 10 MG tablet Take 10 mg by mouth daily.     . cloNIDine (CATAPRES) 0.1 MG tablet Take 0.1 mg by mouth 2 (two) times daily.   5  . hydrALAZINE (APRESOLINE) 100 MG tablet Take 100 mg by mouth 2 (two) times daily.     . hydrochlorothiazide (HYDRODIURIL) 12.5 MG tablet Take 12.5 mg by mouth daily.     Marland Kitchen olmesartan (BENICAR) 40 MG tablet Take 40 mg by mouth daily.     . potassium chloride SA (K-DUR,KLOR-CON) 20 MEQ tablet Take 20 mEq by mouth 2 (two) times daily.     No current facility-administered medications for this visit.      PHYSICAL EXAMINATION: ECOG PERFORMANCE STATUS: 0 - Asymptomatic Vitals:   07/18/18  1417  BP: 108/60  Pulse: (!) 102  Resp: 18  Temp: (!) 96.2 F (35.7 C)   Filed Weights   07/18/18 1417  Weight: 146 lb 4.8 oz (66.4 kg)    Physical Exam Constitutional:      General: He is not in acute distress.    Comments: Thin  HENT:     Head: Normocephalic and atraumatic.  Eyes:     General: No scleral icterus.    Pupils: Pupils are equal, round, and reactive to light.  Neck:     Musculoskeletal: Normal range of motion and neck supple.  Cardiovascular:     Rate and Rhythm: Normal rate and regular rhythm.     Heart sounds: Normal heart sounds.  Pulmonary:     Effort: Pulmonary effort is normal. No respiratory distress.     Breath sounds: Normal breath sounds. No wheezing.  Abdominal:     General: Bowel sounds are normal. There is no distension.     Palpations: Abdomen is soft. There is no mass.     Tenderness: There is no abdominal tenderness.  Musculoskeletal: Normal range of motion.        General: No deformity.  Skin:    General: Skin is warm and dry.     Findings: No erythema or rash.  Neurological:     Mental Status: He is alert and oriented to person, place, and time.     Cranial Nerves: No cranial nerve deficit.     Coordination: Coordination normal.  Psychiatric:        Behavior: Behavior normal.        Thought Content: Thought content normal.      LABORATORY DATA:  I have reviewed the data as listed Lab Results  Component Value Date   WBC 14.0 (H) 07/08/2018   HGB 15.1 07/08/2018   HCT 43.8 07/08/2018   MCV 100.0 07/08/2018   PLT 196 07/08/2018   Recent Labs    06/03/18 1541  NA 142  K 3.8  CL 106  CO2 23  GLUCOSE 67*  BUN 14  CREATININE 0.70  CALCIUM 9.4  GFRNONAA >60  GFRAA >60   Iron/TIBC/Ferritin/ %Sat No results found for: IRON, TIBC, FERRITIN, IRONPCTSAT      ASSESSMENT & PLAN:  1. Lung nodule   2. Hilar adenopathy   3. Tobacco abuse    Image findings and biopsy results were discussed with patient.  Discussed  patient's case on tumor board. Suspicion of underlying lung cancer remains high despite negative biopsy results. Clinically T1bN1M0. Stage IIB disease.  Will refer patient to Surgery Dr.Oaks for further evaluation Recommend obtaining Brain MRI. Smoke cessation was discussed with patient.   Orders Placed This Encounter  Procedures  . Ambulatory referral to General Surgery    Referral Priority:   Routine    Referral Type:   Surgical    Referral Reason:   Specialty Services Required    Referred to Provider:   [name], MD    Requested Specialty:   General Surgery    Number of Visits Requested:   1    All questions were answered. The patient knows to call the clinic with any problems questions or concerns.  Return of visit: to be determined.  Total face to face encounter time for this patient visit was 15 min. >50% of the time was  spent in counseling and coordination of care.  Earlie Server, MD, PhD Hematology Oncology [facility] Pager- [phone] 07/18/2018

## 2018-07-22 ENCOUNTER — Ambulatory Visit: Payer: BLUE CROSS/BLUE SHIELD | Admitting: Radiation Oncology

## 2018-07-24 ENCOUNTER — Other Ambulatory Visit: Payer: Self-pay

## 2018-07-26 ENCOUNTER — Encounter: Payer: Self-pay | Admitting: Cardiothoracic Surgery

## 2018-07-26 ENCOUNTER — Ambulatory Visit (INDEPENDENT_AMBULATORY_CARE_PROVIDER_SITE_OTHER): Payer: BLUE CROSS/BLUE SHIELD | Admitting: Cardiothoracic Surgery

## 2018-07-26 ENCOUNTER — Other Ambulatory Visit: Payer: Self-pay

## 2018-07-26 VITALS — BP 153/96 | HR 80 | Temp 97.3°F | Resp 16 | Ht 73.0 in | Wt 145.4 lb

## 2018-07-26 DIAGNOSIS — R918 Other nonspecific abnormal finding of lung field: Secondary | ICD-10-CM

## 2018-07-26 NOTE — Patient Instructions (Addendum)
We will schedule the Pulmonary functions test. You are scheduled at [facility] for Pulmonary Function testing on 08/01/18 at 7:30 am. You will need to arrive there by 7:15 am. You will report to the [place] and go to the first desk on the right which is Registration. You will have no caffeine and no smoking that day.    We will schedule you to see Dr.Oaks after all test have been done.    Your MRI will be done at [facility] @ 9:00 on 07/27/18  [place]. [place] [facility]   Please enter building ( [facility]) go to thew MRI department.

## 2018-07-26 NOTE — Progress Notes (Signed)
Patient ID: Samuel Choi, male   DOB: 1965-12-06, 53 y.o.   MRN: [id number]  Chief Complaint  Patient presents with  . New Patient (Initial Visit)    Lung mass referred Dr.Yu    Referred By Dr. Tasia Catchings Reason for Referral right upper lobe mass  HPI Location, Quality, Duration, Severity, Timing, Context, Modifying Factors, Associated Signs and Symptoms.  Samuel Choi is a 53 y.o. male.  His problems began when he fell injuring his right hip.  At that time he sustained a fracture of his hip and when he presented to the emergency room a chest x-ray was made.  The chest x-ray showed a possible lesion in the right upper lobe and this was followed by a CT scan and a PET scan.  The CT scan and PET scan showed a right upper lobe lesion with some hilar adenopathy.  The patient underwent a bronchoscopy and a CT-guided needle biopsy.  The bronchoscopy was nondiagnostic but the CT-guided needle biopsy did reveal a marked inflammatory infiltrate in the lung and there was some concern that this may represent a pseudo-tumor of the lung.  He presents here for further evaluation.  He is a lifelong smoker.  He also has several mixed drinks per day.  His history also involves a recent surgical procedure performed at [facility] for resection of a skull base tumor.  The details of that are unclear to me.  He states that they went through his nose to the base of the skull to resect a tumor in May of last year.  There is no family history of lung cancer.  There is no known asbestos exposure.   Past Medical History:  Diagnosis Date  . Alcohol abuse    usually drinks 2-3 drinks per day  . Atherosclerosis 06/2018  . Cancer ([place]) 06/2018   evaluating for lung cancer  . Emphysema of lung ([place]) 06/2018   patient unaware of this.  . History of kidney stones 05/2018   per xray, bilateral nephrolitiasis  . Hypertension     Past Surgical History:  Procedure Laterality Date  . BRAIN SURGERY  10/2017   nasal/sinus endoscopy.  mass benign  . ELECTROMAGNETIC NAVIGATION BROCHOSCOPY Right 06/25/2018   Procedure: ELECTROMAGNETIC NAVIGATION BRONCHOSCOPY;  Surgeon: [name], MD;  Location: [facility] ORS;  Service: Cardiopulmonary;  Laterality: Right;  . ENDOBRONCHIAL ULTRASOUND Right 06/25/2018   Procedure: ENDOBRONCHIAL ULTRASOUND;  Surgeon: [name], MD;  Location: [facility] ORS;  Service: Cardiopulmonary;  Laterality: Right;  . TOE SURGERY Left    pin in left toe    Family History  Problem Relation Age of Onset  . Breast cancer Mother   . Diabetes Mother   . Lung cancer Father   . Hypertension Father     Social History Social History   Tobacco Use  . Smoking status: Current Every Day Smoker    Packs/day: 0.50    Years: 15.00    Pack years: 7.50    Types: Cigarettes  . Smokeless tobacco: Never Used  . Tobacco comment: attempting to cut back.  Substance Use Topics  . Alcohol use: Yes    Alcohol/week: 3.0 standard drinks    Types: 3 Cans of beer per week    Comment: usually 2 drinks a day  . Drug use: No    No Known Allergies  Current Outpatient Medications  Medication Sig Dispense Refill  . amLODipine (NORVASC) 10 MG tablet Take 10 mg by mouth daily.     . cloNIDine (  CATAPRES) 0.1 MG tablet Take 0.1 mg by mouth 2 (two) times daily.   5  . hydrALAZINE (APRESOLINE) 100 MG tablet Take 100 mg by mouth 2 (two) times daily.     . hydrochlorothiazide (HYDRODIURIL) 12.5 MG tablet Take 12.5 mg by mouth daily.     Marland Kitchen olmesartan (BENICAR) 40 MG tablet Take 40 mg by mouth daily.     . potassium chloride SA (K-DUR,KLOR-CON) 20 MEQ tablet Take 20 mEq by mouth 2 (two) times daily.     No current facility-administered medications for this visit.       Review of Systems A complete review of systems was asked and was negative except for the following positive findings he wears glasses.  Blood pressure (!) 153/96, pulse 80, temperature (!) 97.3 F (36.3 C), temperature source Oral, resp. rate 16, height 6\' 1"   (1.854 m), weight 145 lb 6.4 oz (66 kg), SpO2 99 %.  Physical Exam CONSTITUTIONAL:  Pleasant, well-developed, well-nourished, and in no acute distress. EYES: Pupils equal and reactive to light, Sclera non-icteric EARS, NOSE, MOUTH AND THROAT:  The oropharynx was clear.  Dentition is good repair.  Oral mucosa pink and moist. LYMPH NODES:  Lymph nodes in the neck and axillae were normal RESPIRATORY:  Lungs were clear.  Normal respiratory effort without pathologic use of accessory muscles of respiration CARDIOVASCULAR: Heart was regular without murmurs.  There were no carotid bruits. GI: The abdomen was soft, nontender, and nondistended. There were no palpable masses. There was no hepatosplenomegaly. There were normal bowel sounds in all quadrants. GU:  Rectal deferred.   MUSCULOSKELETAL:  Normal muscle strength and tone.  No clubbing or cyanosis.   SKIN:  There were no pathologic skin lesions.  There were no nodules on palpation. NEUROLOGIC:  Sensation is normal.  Cranial nerves are grossly intact. PSYCH:  Oriented to person, place and time.  Mood and affect are normal.  Data Reviewed Chest x-rays and CT scans and PET scans  I have personally reviewed the patient's imaging, laboratory findings and medical records.    Assessment    I have independently reviewed the films.  There is a right upper lobe lesion which has a surrounding halo suggesting an inflammatory response.  There is a enlarged lymph node.  This is in the hilum.  There is no evidence of malignancy.    Plan    It is not clear what the lesion in the right upper lobe may be.  It also is not clear what surgery he had done at [facility].  I question whether or not he may have had a fungal infection in his sinuses for which he underwent surgery and the patient is just not aware of the details.  This may have led to his right upper lobe lesion.  In any event he will have some pulmonary function studies and an MRI of his brain.  That may  give [place] additional information about his sinuses.  He will follow-up with me in 1 week.       [name], MD 07/26/2018, 10:19 AM

## 2018-07-27 ENCOUNTER — Ambulatory Visit (HOSPITAL_COMMUNITY)
Admission: RE | Admit: 2018-07-27 | Discharge: 2018-07-27 | Disposition: A | Discharge status: Home / Self Care | Payer: BLUE CROSS/BLUE SHIELD | Source: Ambulatory Visit | Attending: Oncology | Admitting: Oncology

## 2018-07-27 DIAGNOSIS — R911 Solitary pulmonary nodule: Secondary | ICD-10-CM | POA: Diagnosis not present

## 2018-07-27 MED ORDER — GADOBUTROL 1 MMOL/ML IV SOLN
7.5000 mL | Freq: Once | INTRAVENOUS | Status: AC | PRN
  Administered 2018-07-27: 7.5 mL via INTRAVENOUS

## 2018-08-01 ENCOUNTER — Ambulatory Visit: Payer: BLUE CROSS/BLUE SHIELD | Attending: Cardiothoracic Surgery

## 2018-08-01 ENCOUNTER — Ambulatory Visit: Payer: BLUE CROSS/BLUE SHIELD

## 2018-08-01 DIAGNOSIS — R918 Other nonspecific abnormal finding of lung field: Secondary | ICD-10-CM | POA: Insufficient documentation

## 2018-08-01 DIAGNOSIS — J449 Chronic obstructive pulmonary disease, unspecified: Secondary | ICD-10-CM | POA: Diagnosis not present

## 2018-08-01 MED ORDER — ALBUTEROL SULFATE (2.5 MG/3ML) 0.083% IN NEBU
2.5000 mg | INHALATION_SOLUTION | Freq: Once | RESPIRATORY_TRACT | Status: AC
  Administered 2018-08-01: 2.5 mg via RESPIRATORY_TRACT
  Filled 2018-08-01: qty 3

## 2018-08-01 MED ORDER — ALBUTEROL SULFATE (2.5 MG/3ML) 0.083% IN NEBU
2.5000 mg | INHALATION_SOLUTION | Freq: Once | RESPIRATORY_TRACT | Status: DC
  Filled 2018-08-01: qty 3

## 2018-08-02 ENCOUNTER — Ambulatory Visit (INDEPENDENT_AMBULATORY_CARE_PROVIDER_SITE_OTHER): Payer: BLUE CROSS/BLUE SHIELD | Admitting: Cardiothoracic Surgery

## 2018-08-02 ENCOUNTER — Encounter: Payer: Self-pay | Admitting: Cardiothoracic Surgery

## 2018-08-02 ENCOUNTER — Other Ambulatory Visit: Payer: Self-pay

## 2018-08-02 ENCOUNTER — Telehealth: Payer: Self-pay

## 2018-08-02 VITALS — BP 150/88 | HR 89 | Temp 97.5°F | Resp 18 | Ht 73.0 in | Wt 148.4 lb

## 2018-08-02 DIAGNOSIS — R918 Other nonspecific abnormal finding of lung field: Secondary | ICD-10-CM | POA: Diagnosis not present

## 2018-08-02 NOTE — Progress Notes (Signed)
  Patient ID: Samuel Choi, male   DOB: 08/07/1965, 53 y.o.   MRN: [id number]  HISTORY: He returns today in follow-up.  He has no new complaints.  He is not short of breath.  I have reviewed his records from [facility] and I have discussed his care with Dr. Tasia Catchings in oncology.  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  He has a follow-up appointment at the end of March with the ENT physicians at [facility].   Vitals:   08/02/18 0805  BP: (!) 150/88  Pulse: 89  Resp: 18  Temp: (!) 97.5 F (36.4 C)  SpO2: 96%     EXAM:    Resp: Lungs are clear bilaterally.  No respiratory distress, normal effort. Heart:  Regular without murmurs Abd:  Abdomen is soft, non distended and non tender. No masses are palpable.  There is no rebound and no guarding.  Neurological: Alert and oriented to person, place, and time. Coordination normal.  Skin: Skin is warm and dry. No rash noted. No diaphoretic. No erythema. No pallor.  Psychiatric: Normal mood and affect. Normal behavior. Judgment and thought content normal.    ASSESSMENT: Right upper lobe mass with hilar adenopathy PET positive   PLAN:   I have independently reviewed his pulmonary function studies.  His FEV1 and DLCO are both approximately 70%.  I think you could tolerate a right upper lobectomy.  I have explained to him the indications and risks of surgery risks of bleeding, infection, air leak and death were all reviewed.  He would like [place] to proceed.  I discussed his care with Dr. Tasia Catchings today and she is in agreement as well.    [name], MD

## 2018-08-02 NOTE — Patient Instructions (Addendum)
Please keep your  appointment with Dr.Ebert, ENT.   We will see you after your surgery.

## 2018-08-02 NOTE — Telephone Encounter (Signed)
The patient has been notified that he will need to go to the hospital to pre admit instead of a phone interview. He is scheduled to do this on 08/09/18 at 10:30 am. He is aware of date, time, and instructions.

## 2018-08-02 NOTE — Progress Notes (Addendum)
The patient is scheduled for surgery at [facility] with Dr Genevive Bi on 08/13/18. He will pre admit by phone and Pre Admit testing will call him for this. Surgery instructions have been reviewed with the patient. Dr Olean Ree will be assisting with this surgery.

## 2018-08-02 NOTE — H&P (View-Only) (Signed)
  Patient ID: Samuel Choi, male   DOB: 08/12/1965, 53 y.o.   MRN: [id number]  HISTORY: He returns today in follow-up.  He has no new complaints.  He is not short of breath.  I have reviewed his records from [facility] and I have discussed his care with Dr. Tasia Catchings in oncology.  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  He has a follow-up appointment at the end of March with the ENT physicians at [facility].   Vitals:   08/02/18 0805  BP: (!) 150/88  Pulse: 89  Resp: 18  Temp: (!) 97.5 F (36.4 C)  SpO2: 96%     EXAM:    Resp: Lungs are clear bilaterally.  No respiratory distress, normal effort. Heart:  Regular without murmurs Abd:  Abdomen is soft, non distended and non tender. No masses are palpable.  There is no rebound and no guarding.  Neurological: Alert and oriented to person, place, and time. Coordination normal.  Skin: Skin is warm and dry. No rash noted. No diaphoretic. No erythema. No pallor.  Psychiatric: Normal mood and affect. Normal behavior. Judgment and thought content normal.    ASSESSMENT: Right upper lobe mass with hilar adenopathy PET positive   PLAN:   I have independently reviewed his pulmonary function studies.  His FEV1 and DLCO are both approximately 70%.  I think you could tolerate a right upper lobectomy.  I have explained to him the indications and risks of surgery risks of bleeding, infection, air leak and death were all reviewed.  He would like [place] to proceed.  I discussed his care with Dr. Tasia Catchings today and she is in agreement as well.    [name], MD

## 2018-08-09 ENCOUNTER — Inpatient Hospital Stay: Admission: RE | Admit: 2018-08-09 | Payer: BLUE CROSS/BLUE SHIELD | Source: Ambulatory Visit

## 2018-08-12 ENCOUNTER — Ambulatory Visit
Admission: RE | Admit: 2018-08-12 | Discharge: 2018-08-12 | Disposition: A | Discharge status: Home / Self Care | Payer: BLUE CROSS/BLUE SHIELD | Source: Ambulatory Visit | Attending: Cardiothoracic Surgery | Admitting: Cardiothoracic Surgery

## 2018-08-12 ENCOUNTER — Telehealth: Payer: Self-pay | Admitting: *Deleted

## 2018-08-12 ENCOUNTER — Encounter
Admission: RE | Admit: 2018-08-12 | Discharge: 2018-08-12 | Disposition: A | Discharge status: Home / Self Care | Payer: BLUE CROSS/BLUE SHIELD | Source: Ambulatory Visit | Attending: Cardiothoracic Surgery | Admitting: Cardiothoracic Surgery

## 2018-08-12 ENCOUNTER — Other Ambulatory Visit
Admission: RE | Admit: 2018-08-12 | Discharge: 2018-08-12 | Disposition: A | Discharge status: Home / Self Care | Payer: BLUE CROSS/BLUE SHIELD | Source: Ambulatory Visit | Attending: Cardiothoracic Surgery | Admitting: Cardiothoracic Surgery

## 2018-08-12 ENCOUNTER — Other Ambulatory Visit: Payer: Self-pay

## 2018-08-12 DIAGNOSIS — I444 Left anterior fascicular block: Secondary | ICD-10-CM

## 2018-08-12 DIAGNOSIS — I4510 Unspecified right bundle-branch block: Secondary | ICD-10-CM | POA: Insufficient documentation

## 2018-08-12 DIAGNOSIS — R9431 Abnormal electrocardiogram [ECG] [EKG]: Secondary | ICD-10-CM | POA: Insufficient documentation

## 2018-08-12 DIAGNOSIS — Z01818 Encounter for other preprocedural examination: Secondary | ICD-10-CM

## 2018-08-12 DIAGNOSIS — I452 Bifascicular block: Secondary | ICD-10-CM | POA: Insufficient documentation

## 2018-08-12 DIAGNOSIS — R911 Solitary pulmonary nodule: Secondary | ICD-10-CM | POA: Insufficient documentation

## 2018-08-12 HISTORY — DX: Chronic sinusitis, unspecified: J32.9

## 2018-08-12 HISTORY — DX: Fracture of unspecified part of neck of unspecified femur, initial encounter for closed fracture: S72.009A

## 2018-08-12 LAB — CBC WITH DIFFERENTIAL/PLATELET
Abs Immature Granulocytes: 0.03 10*3/uL (ref 0.00–0.07)
Basophils Absolute: 0.1 10*3/uL (ref 0.0–0.1)
Basophils Relative: 0 %
Eosinophils Absolute: 0.3 10*3/uL (ref 0.0–0.5)
Eosinophils Relative: 3 %
HCT: 40 % (ref 39.0–52.0)
Hemoglobin: 13.5 g/dL (ref 13.0–17.0)
Immature Granulocytes: 0 %
Lymphocytes Relative: 16 %
Lymphs Abs: 1.8 10*3/uL (ref 0.7–4.0)
MCH: 33.8 pg (ref 26.0–34.0)
MCHC: 33.8 g/dL (ref 30.0–36.0)
MCV: 100 fL (ref 80.0–100.0)
Monocytes Absolute: 0.8 10*3/uL (ref 0.1–1.0)
Monocytes Relative: 7 %
Neutro Abs: 8.4 10*3/uL — ABNORMAL HIGH (ref 1.7–7.7)
Neutrophils Relative %: 74 %
Platelets: 183 10*3/uL (ref 150–400)
RBC: 4 MIL/uL — ABNORMAL LOW (ref 4.22–5.81)
RDW: 12 % (ref 11.5–15.5)
WBC: 11.4 10*3/uL — ABNORMAL HIGH (ref 4.0–10.5)
nRBC: 0 % (ref 0.0–0.2)

## 2018-08-12 LAB — PROTIME-INR
INR: 0.99
Prothrombin Time: 13 s (ref 11.4–15.2)

## 2018-08-12 LAB — COMPREHENSIVE METABOLIC PANEL
ALT: 14 U/L (ref 0–44)
AST: 18 U/L (ref 15–41)
Albumin: 4.2 g/dL (ref 3.5–5.0)
Alkaline Phosphatase: 69 U/L (ref 38–126)
Anion gap: 6 (ref 5–15)
BUN: 11 mg/dL (ref 6–20)
CO2: 28 mmol/L (ref 22–32)
Calcium: 9.2 mg/dL (ref 8.9–10.3)
Chloride: 108 mmol/L (ref 98–111)
Creatinine, Ser: 0.93 mg/dL (ref 0.61–1.24)
GFR calc non Af Amer: >60 mL/min (ref >60)
Glucose, Bld: 92 mg/dL (ref 70–99)
Potassium: 3.5 mmol/L (ref 3.5–5.1)
Sodium: 142 mmol/L (ref 135–145)
Total Bilirubin: 0.7 mg/dL (ref 0.3–1.2)
Total Protein: 7.5 g/dL (ref 6.5–8.1)

## 2018-08-12 LAB — APTT: aPTT: 33 s (ref 24–36)

## 2018-08-12 MED ORDER — CEFAZOLIN SODIUM-DEXTROSE 2-4 GM/100ML-% IV SOLN
2.0000 g | INTRAVENOUS | Status: AC
  Administered 2018-08-13: 2 g via INTRAVENOUS

## 2018-08-12 NOTE — Telephone Encounter (Signed)
Per Caryl Pina with Pre-admit, patient was a no show for Pre-admission Testing appointment on Friday.   Per Caryl Pina, patient needs to be at [facility] at 6 am in the morning. He is aware he will not need to call and get his arrival time this afternoon just arrive at 6 am tomorrow.   The patient verbalizes understanding.

## 2018-08-12 NOTE — Patient Instructions (Signed)
Your procedure is scheduled on: Tuesday, February 4th  Report to [facility]    DO NOT STOP ON THE FIRST FLOOR TO REGISTER ARRIVE IN SAME DAY SURGERY AT 6:00 AM   Remember: Instructions that are not followed completely may result in serious medical risk,  up to and including death, or upon the discretion of your surgeon and anesthesiologist your  surgery may need to be rescheduled.     _X__ 1. Do not eat food after midnight the night before your procedure.                 No gum chewing or hard candies.                    ABSOLUTELY NOTHING SOLID IN YOUR MOUTH AFTER MIDNIGHT                  You may drink clear liquids up to 2 hours before you are scheduled to arrive for your surgery-                    DO not drink clear liquids AFTER 4 AM TONIGHT                  Clear Liquids include:  water, apple juice without pulp, clear carbohydrate                 drink such as Clearfast of Gatorade, Black Coffee or Tea (Do not add                 anything to coffee or tea).  __X__2.  On the morning of surgery brush your teeth with toothpaste and water,                   You may rinse your mouth with mouthwash if you wish.                      Do not swallow any toothpaste of mouthwash.     _X__ 3.  No Alcohol for 24 hours before or after surgery.   _X__ 4.  Do Not Smoke or use e-cigarettes For 24 Hours Prior to Your Surgery.                 Do not use any chewable tobacco products for at least 6 hours prior to                 surgery.  ____  5.  Bring all medications with you on the day of surgery if instructed.   ____  6.  Notify your doctor if there is any change in your medical condition      (cold, fever, infections).     Do not wear jewelry, make-up, hairpins, clips or nail polish. Do not wear lotions, powders, or perfumes. You may NOT wear deodorant. Do not shave 48 hours prior to surgery. Men may shave face and neck. Do not bring  valuables to the hospital.    [facility] is not responsible for any belongings or valuables.  Contacts, dentures or bridgework may not be worn into surgery. Leave your suitcase in the car. After surgery it may be brought to your room. For patients admitted to the hospital, discharge time is determined by your treatment team.   Patients discharged the day of surgery will not be allowed to drive home.   Please read over the  following fact sheets that you were given:   PREPARING FOR SURGERY  __X__ Take these medicines the morning of surgery with A SIP OF WATER:    1. NORVASC/AMLODIPINE  2. CLONIDINE  3. HYDRALAZINE  4. USE THE NASAL IRRIGATION PRIOR TO COMING TO [facility]  5.  6.  ____ Fleet Enema (as directed)   __X__ Use ANTIBACTERIAL Soap as directed  ____ Use inhalers on the day of surgery  __X__ Stop ALL ASPIRIN PRODUCTS  _X___ Stop Anti-inflammatories AS OF TODAY               THIS INCLUDES NAPROSYN   ____ Stop supplements until after surgery.    ____ Bring C-Pap to the hospital.   WEAR COMFORTABLE CLOTHES TO [place] AND HAVE     STURDY SHOES FOR THE TIME YOU ARE HERE.  DO NOT TAKE TOMORROW MORNING:       HYDROCHLOROTHIAZIDE       POTASSIUM       BENICAR

## 2018-08-12 NOTE — Pre-Procedure Instructions (Signed)
Patient did not arrive for P.A.T. appointment on 08/09/18 as he stated he was not aware of that appointment.  Stated that he was told to come at 6 am on the day of surgery for pre-op work up.  Telephone interview completed today. Blood work, EKG and CXR to be on morning of surgery.

## 2018-08-13 ENCOUNTER — Inpatient Hospital Stay: Payer: BLUE CROSS/BLUE SHIELD | Admitting: Anesthesiology

## 2018-08-13 ENCOUNTER — Inpatient Hospital Stay
Admission: RE | Admit: 2018-08-13 | Discharge: 2018-08-24 | DRG: 164 | Disposition: A | Discharge status: Home / Self Care | Payer: BLUE CROSS/BLUE SHIELD | Attending: Cardiothoracic Surgery | Admitting: Cardiothoracic Surgery

## 2018-08-13 ENCOUNTER — Encounter
Admission: RE | Disposition: A | Discharge status: Home / Self Care | Payer: Self-pay | Source: Home / Self Care | Attending: Cardiothoracic Surgery

## 2018-08-13 ENCOUNTER — Encounter: Payer: Self-pay | Admitting: *Deleted

## 2018-08-13 ENCOUNTER — Other Ambulatory Visit: Payer: Self-pay

## 2018-08-13 ENCOUNTER — Inpatient Hospital Stay: Payer: BLUE CROSS/BLUE SHIELD

## 2018-08-13 DIAGNOSIS — Z8249 Family history of ischemic heart disease and other diseases of the circulatory system: Secondary | ICD-10-CM | POA: Diagnosis not present

## 2018-08-13 DIAGNOSIS — E440 Moderate protein-calorie malnutrition: Secondary | ICD-10-CM | POA: Diagnosis present

## 2018-08-13 DIAGNOSIS — J9382 Other air leak: Secondary | ICD-10-CM | POA: Diagnosis not present

## 2018-08-13 DIAGNOSIS — Z01810 Encounter for preprocedural cardiovascular examination: Secondary | ICD-10-CM

## 2018-08-13 DIAGNOSIS — J310 Chronic rhinitis: Secondary | ICD-10-CM | POA: Diagnosis present

## 2018-08-13 DIAGNOSIS — R918 Other nonspecific abnormal finding of lung field: Secondary | ICD-10-CM | POA: Diagnosis present

## 2018-08-13 DIAGNOSIS — C3411 Malignant neoplasm of upper lobe, right bronchus or lung: Principal | ICD-10-CM | POA: Diagnosis present

## 2018-08-13 DIAGNOSIS — F1010 Alcohol abuse, uncomplicated: Secondary | ICD-10-CM | POA: Diagnosis present

## 2018-08-13 DIAGNOSIS — I472 Ventricular tachycardia: Secondary | ICD-10-CM | POA: Diagnosis not present

## 2018-08-13 DIAGNOSIS — I10 Essential (primary) hypertension: Secondary | ICD-10-CM | POA: Diagnosis present

## 2018-08-13 DIAGNOSIS — I160 Hypertensive urgency: Secondary | ICD-10-CM | POA: Diagnosis present

## 2018-08-13 DIAGNOSIS — Z09 Encounter for follow-up examination after completed treatment for conditions other than malignant neoplasm: Secondary | ICD-10-CM

## 2018-08-13 DIAGNOSIS — Z681 Body mass index (BMI) 19 or less, adult: Secondary | ICD-10-CM | POA: Diagnosis not present

## 2018-08-13 DIAGNOSIS — J449 Chronic obstructive pulmonary disease, unspecified: Secondary | ICD-10-CM | POA: Diagnosis present

## 2018-08-13 DIAGNOSIS — Z801 Family history of malignant neoplasm of trachea, bronchus and lung: Secondary | ICD-10-CM | POA: Diagnosis not present

## 2018-08-13 DIAGNOSIS — C3491 Malignant neoplasm of unspecified part of right bronchus or lung: Secondary | ICD-10-CM | POA: Diagnosis not present

## 2018-08-13 DIAGNOSIS — F17210 Nicotine dependence, cigarettes, uncomplicated: Secondary | ICD-10-CM | POA: Diagnosis present

## 2018-08-13 DIAGNOSIS — Z9689 Presence of other specified functional implants: Secondary | ICD-10-CM

## 2018-08-13 DIAGNOSIS — Z9889 Other specified postprocedural states: Secondary | ICD-10-CM

## 2018-08-13 HISTORY — PX: THORACOTOMY: SHX5074

## 2018-08-13 LAB — BLOOD GAS, ARTERIAL
Acid-Base Excess: 0.3 mmol/L (ref 0.0–2.0)
Bicarbonate: 23.3 mmol/L (ref 20.0–28.0)
FIO2: 0.21
O2 Saturation: 97.3 %
Patient temperature: 37
pCO2 arterial: 32 mmHg (ref 32.0–48.0)
pH, Arterial: 7.47 — ABNORMAL HIGH (ref 7.350–7.450)
pO2, Arterial: 88 mmHg (ref 83.0–108.0)

## 2018-08-13 LAB — GLUCOSE, CAPILLARY: Glucose-Capillary: 150 mg/dL — ABNORMAL HIGH (ref 70–99)

## 2018-08-13 SURGERY — THORACOTOMY, MAJOR
Anesthesia: General | Laterality: Right

## 2018-08-13 MED ORDER — ALBUTEROL SULFATE (2.5 MG/3ML) 0.083% IN NEBU
2.5000 mg | INHALATION_SOLUTION | RESPIRATORY_TRACT | Status: DC
  Administered 2018-08-13 – 2018-08-16 (×12): 2.5 mg via RESPIRATORY_TRACT
  Filled 2018-08-13 (×14): qty 3

## 2018-08-13 MED ORDER — PROPOFOL 10 MG/ML IV BOLUS
INTRAVENOUS | Status: AC
  Filled 2018-08-13: qty 20

## 2018-08-13 MED ORDER — ONDANSETRON HCL 4 MG/2ML IJ SOLN
INTRAMUSCULAR | Status: AC
  Filled 2018-08-13: qty 2

## 2018-08-13 MED ORDER — MIDAZOLAM HCL 2 MG/2ML IJ SOLN
INTRAMUSCULAR | Status: AC
  Filled 2018-08-13: qty 2

## 2018-08-13 MED ORDER — PROPOFOL 10 MG/ML IV BOLUS
INTRAVENOUS | Status: DC | PRN
  Administered 2018-08-13: 130 mg via INTRAVENOUS

## 2018-08-13 MED ORDER — DEXAMETHASONE SODIUM PHOSPHATE 10 MG/ML IJ SOLN
INTRAMUSCULAR | Status: DC | PRN
  Administered 2018-08-13: 10 mg via INTRAVENOUS

## 2018-08-13 MED ORDER — FAMOTIDINE 20 MG PO TABS
ORAL_TABLET | ORAL | Status: AC
  Filled 2018-08-13: qty 1

## 2018-08-13 MED ORDER — LACTATED RINGERS IV SOLN
INTRAVENOUS | Status: DC
  Administered 2018-08-13 (×2): via INTRAVENOUS

## 2018-08-13 MED ORDER — TRAMADOL HCL 50 MG PO TABS
50.0000 mg | ORAL_TABLET | Freq: Four times a day (QID) | ORAL | Status: DC
  Administered 2018-08-13 – 2018-08-14 (×3): 50 mg via ORAL
  Filled 2018-08-13 (×3): qty 1

## 2018-08-13 MED ORDER — FENTANYL CITRATE (PF) 100 MCG/2ML IJ SOLN
25.0000 ug | INTRAMUSCULAR | Status: DC | PRN
  Administered 2018-08-13 (×3): 50 ug via INTRAVENOUS
  Administered 2018-08-13 (×2): 25 ug via INTRAVENOUS

## 2018-08-13 MED ORDER — SUCCINYLCHOLINE CHLORIDE 20 MG/ML IJ SOLN
INTRAMUSCULAR | Status: AC
  Filled 2018-08-13: qty 1

## 2018-08-13 MED ORDER — MIDAZOLAM HCL 2 MG/2ML IJ SOLN
INTRAMUSCULAR | Status: DC | PRN
  Administered 2018-08-13: 2 mg via INTRAVENOUS

## 2018-08-13 MED ORDER — BISACODYL 5 MG PO TBEC
10.0000 mg | DELAYED_RELEASE_TABLET | Freq: Every day | ORAL | Status: DC
  Administered 2018-08-14 – 2018-08-23 (×9): 10 mg via ORAL
  Filled 2018-08-13 (×10): qty 2

## 2018-08-13 MED ORDER — GLYCOPYRROLATE 0.2 MG/ML IJ SOLN
INTRAMUSCULAR | Status: DC | PRN
  Administered 2018-08-13 (×2): 0.1 mg via INTRAVENOUS

## 2018-08-13 MED ORDER — PHENYLEPHRINE HCL 10 MG/ML IJ SOLN
INTRAMUSCULAR | Status: DC | PRN
  Administered 2018-08-13: 150 ug via INTRAVENOUS
  Administered 2018-08-13 (×3): 100 ug via INTRAVENOUS
  Administered 2018-08-13: 150 ug via INTRAVENOUS
  Administered 2018-08-13 (×2): 100 ug via INTRAVENOUS
  Administered 2018-08-13: 200 ug via INTRAVENOUS
  Administered 2018-08-13: 100 ug via INTRAVENOUS
  Administered 2018-08-13: 50 ug via INTRAVENOUS
  Administered 2018-08-13: 100 ug via INTRAVENOUS

## 2018-08-13 MED ORDER — HYDRALAZINE HCL 20 MG/ML IJ SOLN
INTRAMUSCULAR | Status: AC
  Administered 2018-08-13: 20 mg
  Filled 2018-08-13: qty 1

## 2018-08-13 MED ORDER — FAMOTIDINE 20 MG PO TABS
20.0000 mg | ORAL_TABLET | Freq: Once | ORAL | Status: AC
  Administered 2018-08-13: 20 mg via ORAL

## 2018-08-13 MED ORDER — ALBUTEROL SULFATE (2.5 MG/3ML) 0.083% IN NEBU
INHALATION_SOLUTION | RESPIRATORY_TRACT | Status: AC
  Filled 2018-08-13: qty 3

## 2018-08-13 MED ORDER — CEFAZOLIN SODIUM-DEXTROSE 2-4 GM/100ML-% IV SOLN
2.0000 g | Freq: Three times a day (TID) | INTRAVENOUS | Status: AC
  Administered 2018-08-13 (×2): 2 g via INTRAVENOUS
  Filled 2018-08-13: qty 100

## 2018-08-13 MED ORDER — CLONIDINE HCL 0.1 MG PO TABS
0.1000 mg | ORAL_TABLET | Freq: Every day | ORAL | Status: DC
  Administered 2018-08-14 – 2018-08-24 (×10): 0.1 mg via ORAL
  Filled 2018-08-13 (×10): qty 1

## 2018-08-13 MED ORDER — KETAMINE HCL 10 MG/ML IJ SOLN
INTRAMUSCULAR | Status: DC | PRN
  Administered 2018-08-13: 20 mg via INTRAVENOUS
  Administered 2018-08-13: 30 mg via INTRAVENOUS
  Administered 2018-08-13: 20 mg via INTRAVENOUS

## 2018-08-13 MED ORDER — HYDRALAZINE HCL 20 MG/ML IJ SOLN
20.0000 mg | INTRAMUSCULAR | Status: AC
  Administered 2018-08-13: 20 mg via INTRAVENOUS
  Filled 2018-08-13: qty 1

## 2018-08-13 MED ORDER — DEXAMETHASONE SODIUM PHOSPHATE 10 MG/ML IJ SOLN
INTRAMUSCULAR | Status: AC
  Filled 2018-08-13: qty 1

## 2018-08-13 MED ORDER — FENTANYL CITRATE (PF) 100 MCG/2ML IJ SOLN
INTRAMUSCULAR | Status: AC
  Administered 2018-08-13: 16:00:00
  Filled 2018-08-13: qty 2

## 2018-08-13 MED ORDER — BUPIVACAINE LIPOSOME 1.3 % IJ SUSP
INTRAMUSCULAR | Status: AC
  Filled 2018-08-13: qty 20

## 2018-08-13 MED ORDER — ONDANSETRON HCL 4 MG/2ML IJ SOLN
4.0000 mg | Freq: Four times a day (QID) | INTRAMUSCULAR | Status: DC | PRN
  Administered 2018-08-20: 4 mg via INTRAVENOUS

## 2018-08-13 MED ORDER — PHENYLEPHRINE HCL 10 MG/ML IJ SOLN
INTRAMUSCULAR | Status: AC
  Filled 2018-08-13: qty 1

## 2018-08-13 MED ORDER — FENTANYL CITRATE (PF) 250 MCG/5ML IJ SOLN
INTRAMUSCULAR | Status: AC
  Filled 2018-08-13: qty 5

## 2018-08-13 MED ORDER — AMLODIPINE BESYLATE 10 MG PO TABS
10.0000 mg | ORAL_TABLET | Freq: Every day | ORAL | Status: DC
  Administered 2018-08-13 – 2018-08-24 (×11): 10 mg via ORAL
  Filled 2018-08-13 (×11): qty 1

## 2018-08-13 MED ORDER — KETAMINE HCL 50 MG/ML IJ SOLN
INTRAMUSCULAR | Status: AC
  Filled 2018-08-13: qty 10

## 2018-08-13 MED ORDER — SUGAMMADEX SODIUM 200 MG/2ML IV SOLN
INTRAVENOUS | Status: AC
  Filled 2018-08-13: qty 2

## 2018-08-13 MED ORDER — FENTANYL CITRATE (PF) 100 MCG/2ML IJ SOLN: 50.0000 ug | Freq: Once | INTRAMUSCULAR | Status: DC

## 2018-08-13 MED ORDER — SODIUM CHLORIDE 0.9 % IV SOLN
INTRAVENOUS | Status: DC | PRN
  Administered 2018-08-13: 50 ug/min via INTRAVENOUS

## 2018-08-13 MED ORDER — SUGAMMADEX SODIUM 200 MG/2ML IV SOLN
INTRAVENOUS | Status: DC | PRN
  Administered 2018-08-13: 150 mg via INTRAVENOUS

## 2018-08-13 MED ORDER — FENTANYL CITRATE (PF) 100 MCG/2ML IJ SOLN
INTRAMUSCULAR | Status: AC
  Administered 2018-08-13: 25 ug via INTRAVENOUS
  Filled 2018-08-13: qty 2

## 2018-08-13 MED ORDER — PROMETHAZINE HCL 25 MG/ML IJ SOLN: 6.2500 mg | INTRAMUSCULAR | Status: DC | PRN

## 2018-08-13 MED ORDER — ROCURONIUM BROMIDE 100 MG/10ML IV SOLN
INTRAVENOUS | Status: DC | PRN
  Administered 2018-08-13: 50 mg via INTRAVENOUS
  Administered 2018-08-13 (×2): 10 mg via INTRAVENOUS

## 2018-08-13 MED ORDER — EPHEDRINE SULFATE 50 MG/ML IJ SOLN
INTRAMUSCULAR | Status: AC
  Filled 2018-08-13: qty 1

## 2018-08-13 MED ORDER — MORPHINE SULFATE (PF) 4 MG/ML IV SOLN
INTRAVENOUS | Status: AC
  Administered 2018-08-13: 2 mg via INTRAVENOUS
  Filled 2018-08-13: qty 1

## 2018-08-13 MED ORDER — ROCURONIUM BROMIDE 50 MG/5ML IV SOLN
INTRAVENOUS | Status: AC
  Filled 2018-08-13: qty 1

## 2018-08-13 MED ORDER — LIDOCAINE HCL (CARDIAC) PF 100 MG/5ML IV SOSY
PREFILLED_SYRINGE | INTRAVENOUS | Status: DC | PRN
  Administered 2018-08-13 (×2): 80 mg via INTRAVENOUS

## 2018-08-13 MED ORDER — OXYCODONE-ACETAMINOPHEN 5-325 MG PO TABS
1.0000 | ORAL_TABLET | ORAL | Status: DC | PRN
  Administered 2018-08-14: 1 via ORAL
  Filled 2018-08-13: qty 1

## 2018-08-13 MED ORDER — SODIUM CHLORIDE 0.9 % IV SOLN
INTRAVENOUS | Status: DC | PRN
  Administered 2018-08-13: 70 mL

## 2018-08-13 MED ORDER — BUPIVACAINE HCL 0.25 % IJ SOLN
INTRAMUSCULAR | Status: DC | PRN
  Administered 2018-08-13: 30 mL

## 2018-08-13 MED ORDER — BUPIVACAINE HCL (PF) 0.5 % IJ SOLN
INTRAMUSCULAR | Status: AC
  Filled 2018-08-13: qty 30

## 2018-08-13 MED ORDER — CHLORHEXIDINE GLUCONATE CLOTH 2 % EX PADS: 6.0000 | MEDICATED_PAD | Freq: Once | CUTANEOUS | Status: DC

## 2018-08-13 MED ORDER — MORPHINE SULFATE (PF) 4 MG/ML IV SOLN
1.0000 mg | INTRAVENOUS | Status: DC | PRN
  Administered 2018-08-13 – 2018-08-14 (×8): 2 mg via INTRAVENOUS
  Filled 2018-08-13 (×7): qty 1

## 2018-08-13 MED ORDER — CEFAZOLIN SODIUM-DEXTROSE 2-4 GM/100ML-% IV SOLN
INTRAVENOUS | Status: AC
  Filled 2018-08-13: qty 100

## 2018-08-13 MED ORDER — HYDRALAZINE HCL 50 MG PO TABS
100.0000 mg | ORAL_TABLET | Freq: Two times a day (BID) | ORAL | Status: DC
  Administered 2018-08-13 – 2018-08-24 (×21): 100 mg via ORAL
  Filled 2018-08-13 (×22): qty 2

## 2018-08-13 MED ORDER — LIDOCAINE HCL (PF) 2 % IJ SOLN
INTRAMUSCULAR | Status: AC
  Filled 2018-08-13: qty 10

## 2018-08-13 MED ORDER — FENTANYL CITRATE (PF) 100 MCG/2ML IJ SOLN
INTRAMUSCULAR | Status: DC | PRN
  Administered 2018-08-13 (×2): 50 ug via INTRAVENOUS
  Administered 2018-08-13: 25 ug via INTRAVENOUS
  Administered 2018-08-13 (×2): 50 ug via INTRAVENOUS
  Administered 2018-08-13: 25 ug via INTRAVENOUS

## 2018-08-13 MED ORDER — METHOCARBAMOL 500 MG PO TABS
500.0000 mg | ORAL_TABLET | Freq: Four times a day (QID) | ORAL | Status: DC
  Administered 2018-08-13 – 2018-08-24 (×42): 500 mg via ORAL
  Filled 2018-08-13 (×47): qty 1

## 2018-08-13 MED ORDER — SODIUM CHLORIDE FLUSH 0.9 % IV SOLN
INTRAVENOUS | Status: AC
  Filled 2018-08-13: qty 50

## 2018-08-13 MED ORDER — KCL IN DEXTROSE-NACL 20-5-0.45 MEQ/L-%-% IV SOLN
INTRAVENOUS | Status: DC
  Administered 2018-08-13 – 2018-08-14 (×3): via INTRAVENOUS
  Filled 2018-08-13 (×7): qty 1000

## 2018-08-13 MED ORDER — ONDANSETRON HCL 4 MG/2ML IJ SOLN
INTRAMUSCULAR | Status: DC | PRN
  Administered 2018-08-13: 4 mg via INTRAVENOUS

## 2018-08-13 SURGICAL SUPPLY — 75 items
BENZOIN TINCTURE PRP APPL 2/3 (GAUZE/BANDAGES/DRESSINGS) ×1 IMPLANT
BNDG COHESIVE 4X5 TAN STRL (GAUZE/BANDAGES/DRESSINGS) IMPLANT
BRONCHOSCOPE PED SLIM DISP (MISCELLANEOUS) ×2 IMPLANT
CANISTER SUCT 1200ML W/VALVE (MISCELLANEOUS) ×2 IMPLANT
CATH  RT ANGL  28F  SOF (CATHETERS) ×1
CATH RT ANGL 28F SOF (CATHETERS) IMPLANT
CATH THOR STR 28F  SOFT WA (CATHETERS) ×1
CATH THOR STR 28F SOFT WA (CATHETERS) IMPLANT
CATH URET ROBINSON 16FR STRL (CATHETERS) ×1 IMPLANT
CHLORAPREP W/TINT 26ML (MISCELLANEOUS) ×4 IMPLANT
CNTNR SPEC 2.5X3XGRAD LEK (MISCELLANEOUS)
CONN REDUCER 3/8X3/8X3/8Y (CONNECTOR) ×2
CONNECTOR REDUCER 3/8X3/8 (MISCELLANEOUS) ×2 IMPLANT
CONNECTOR REDUCER 3/8X3/8X3/8Y (CONNECTOR) IMPLANT
CONT SPEC 4OZ STER OR WHT (MISCELLANEOUS)
CONTAINER SPEC 2.5X3XGRAD LEK (MISCELLANEOUS) ×4 IMPLANT
CUTTER ECHEON FLEX ENDO 45 340 (ENDOMECHANICALS) ×1 IMPLANT
DRAIN CHEST DRY SUCT SGL (MISCELLANEOUS) ×2 IMPLANT
DRAPE C-SECTION (MISCELLANEOUS) ×2 IMPLANT
DRAPE MAG INST 16X20 L/F (DRAPES) ×2 IMPLANT
DRSG OPSITE POSTOP 4X6 (GAUZE/BANDAGES/DRESSINGS) ×1 IMPLANT
DRSG OPSITE POSTOP 4X8 (GAUZE/BANDAGES/DRESSINGS) ×1 IMPLANT
DRSG TELFA 3X8 NADH (GAUZE/BANDAGES/DRESSINGS) ×2 IMPLANT
ELECT BLADE 6.5 EXT (BLADE) ×2 IMPLANT
ELECT CAUTERY BLADE TIP 2.5 (TIP) ×2
ELECT REM PT RETURN 9FT ADLT (ELECTROSURGICAL) ×2
ELECTRODE CAUTERY BLDE TIP 2.5 (TIP) ×1 IMPLANT
ELECTRODE REM PT RTRN 9FT ADLT (ELECTROSURGICAL) ×1 IMPLANT
GAUZE SPONGE 4X4 12PLY STRL (GAUZE/BANDAGES/DRESSINGS) ×2 IMPLANT
GLOVE SURG SYN 7.5  E (GLOVE) ×4
GLOVE SURG SYN 7.5 E (GLOVE) ×4 IMPLANT
GLOVE SURG SYN 7.5 PF PI (GLOVE) ×2 IMPLANT
GOWN STRL REUS W/ TWL LRG LVL3 (GOWN DISPOSABLE) ×3 IMPLANT
GOWN STRL REUS W/TWL LRG LVL3 (GOWN DISPOSABLE) ×4
KIT TURNOVER KIT A (KITS) ×2 IMPLANT
LABEL OR SOLS (LABEL) ×2 IMPLANT
LOOP RED MAXI  1X406MM (MISCELLANEOUS) ×1
LOOP VESSEL MAXI 1X406 RED (MISCELLANEOUS) ×1 IMPLANT
MARKER SKIN DUAL TIP RULER LAB (MISCELLANEOUS) ×2 IMPLANT
NDL SPNL 22GX3.5 QUINCKE BK (NEEDLE) ×1 IMPLANT
NEEDLE SPNL 22GX3.5 QUINCKE BK (NEEDLE) ×2 IMPLANT
PACK BASIN MAJOR ARMC (MISCELLANEOUS) ×2 IMPLANT
PAD DRESSING TELFA 3X8 NADH (GAUZE/BANDAGES/DRESSINGS) ×1 IMPLANT
RELOAD PROXIMATE TA60MM GREEN (ENDOMECHANICALS) IMPLANT
RELOAD STAPLE 35X2.5 WHT THIN (STAPLE) ×4 IMPLANT
RELOAD STAPLE 45 GOLD REG/THCK (STAPLE) IMPLANT
RELOAD STAPLE 60 4.7 GRN THCK (ENDOMECHANICALS) IMPLANT
RELOAD STAPLER LINE PROX 30 GR (STAPLE) IMPLANT
SPONGE KITTNER 5P (MISCELLANEOUS) ×2 IMPLANT
STAPLE RELOAD 2.5MM WHITE (STAPLE) IMPLANT
STAPLE RELOAD 45MM GOLD (STAPLE) ×10 IMPLANT
STAPLER RELOAD LINE PROX 30 GR (STAPLE)
STAPLER RELOADABLE 30 GRN THCK (STAPLE) ×1 IMPLANT
STAPLER SKIN PROX 35W (STAPLE) ×2 IMPLANT
STAPLER VASCULAR ECHELON 35 (CUTTER) IMPLANT
STRIP CLOSURE SKIN 1/2X4 (GAUZE/BANDAGES/DRESSINGS) ×2 IMPLANT
SUT MNCRL AB 3-0 PS2 27 (SUTURE) IMPLANT
SUT PROLENE 5 0 RB 1 DA (SUTURE) IMPLANT
SUT SILK 0 (SUTURE) ×1
SUT SILK 0 30XBRD TIE 6 (SUTURE) ×1 IMPLANT
SUT SILK 1 SH (SUTURE) ×11 IMPLANT
SUT VIC AB 0 CT1 36 (SUTURE) ×4 IMPLANT
SUT VIC AB 2-0 CT1 27 (SUTURE) ×2
SUT VIC AB 2-0 CT1 TAPERPNT 27 (SUTURE) ×2 IMPLANT
SUT VICRYL 2 TP 1 (SUTURE) ×8 IMPLANT
SYR 10ML SLIP (SYRINGE) ×2 IMPLANT
SYR BULB IRRIG 60ML STRL (SYRINGE) ×2 IMPLANT
TAPE CLOTH 3X10 WHT NS LF (GAUZE/BANDAGES/DRESSINGS) ×2 IMPLANT
TAPE TRANSPORE STRL 2 31045 (GAUZE/BANDAGES/DRESSINGS) IMPLANT
TRAY FOLEY MTR SLVR 16FR STAT (SET/KITS/TRAYS/PACK) ×2 IMPLANT
TROCAR FLEXIPATH 20X80 (ENDOMECHANICALS) IMPLANT
TROCAR FLEXIPATH THORACIC 15MM (ENDOMECHANICALS) IMPLANT
TUBING CONNECTING 10 (TUBING) ×2 IMPLANT
WATER STERILE IRR 1000ML POUR (IV SOLUTION) ×2 IMPLANT
YANKAUER SUCT BULB TIP FLEX NO (MISCELLANEOUS) ×3 IMPLANT

## 2018-08-13 NOTE — Consult Note (Signed)
CRITICAL CARE NOTE  CC   Post thoracotomy and RUL resection.     HPI This is a pleasant 53 year old male he has a background history of chronic rhinitis, essential hypertension actively smoking tobacco, and right upper lobe lung mass.  He was seen by Dr. Faith Rogue for right upper lobe resection and was subsequently transferred to MICU for upgrading care.  Upon arrival patient is in hypertensive urgency with blood pressure 190/100.  Patient states he has 5 antihypertensives which is difficult for him to keep up with however he relates that usually his blood pressure is within reference range. Smokes approximately half a pack has been doing so for the past 20 or so years.          SIGNIFICANT EVENTS     Right chest pain around surgical site-moderate   BP (!) 164/98 (BP Location: Left Arm)   Pulse 100   Temp (!) 97.1 F (36.2 C)   Resp (!) 28   Ht 6\' 1"  (1.854 m)   Wt 67.3 kg   SpO2 97%   BMI 19.57 kg/m    REVIEW OF SYSTEMS  -10 system ROS completed and is negative except as per HPI  PHYSICAL EXAMINATION:  GENERAL: No acute distress HEAD: Normocephalic, atraumatic.  EYES: .  No scleral icterus.  MOUTH: Moist mucosal membrane. NECK: Supple. No thyromegaly. No nodules. No JVD.  PULMONARY: +rhonchi mild at right lung  CARDIOVASCULAR: S1 and S2. Regular rate and rhythm. No murmurs, rubs, or gallops.  GASTROINTESTINAL: Soft, nontender, -distended. No masses. Positive bowel sounds. No hepatosplenomegaly.  MUSCULOSKELETAL: No swelling, clubbing, or edema.  NEUROLOGIC: obtunded, GCS14 SKIN:intact,warm,dry      Indwelling Urinary Catheter continued, requirement due to   Reason to continue Indwelling Urinary Catheter for strict Intake/Output monitoring for hemodynamic instability   Central Line continued, requirement due to   Reason to continue [employer] Monitoring of central venous pressure or other hemodynamic parameters   Ventilator continued, requirement due to,  resp failure    Ventilator Sedation RASS 0 to -2     ASSESSMENT AND PLAN SYNOPSIS  Hypertensive urgency    -Goal to decrease by 25% initially will give hydralazine 20 with reassessment.      -takes clonidine, hydralazine, amlodipine, hctz.on outpatient.    Right thoracotomy  - s/p Chest tube management      - good tidal motion , forced expiration with air leak      - 180 cc in atrium during exam        CARDIAC ICU monitoring   GI/Nutrition GI PROPHYLAXIS as indicated DIET-->TF's as tolerated Constipation protocol as indicated  ENDO - ICU hypoglycemic\Hyperglycemia protocol -check FSBS per protocol   ELECTROLYTES -follow labs as needed -replace as needed -pharmacy consultation and following   DVT/GI PRX ordered TRANSFUSIONS AS NEEDED MONITOR FSBS ASSESS the need for LABS as needed      Ottie Glazier, M.D.  Pulmonary & Critical Care Medicine

## 2018-08-13 NOTE — Op Note (Signed)
  08/13/2018  11:31 AM  PATIENT:  Samuel Choi  53 y.o. male  PRE-OPERATIVE DIAGNOSIS: Right upper lobe mass  POST-OPERATIVE DIAGNOSIS: Same  PROCEDURE: Preoperative bronchoscopy to assess endobronchial anatomy; right thoracotomy with right upper lobe wedge resection.  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  A percutaneous biopsy did not reveal any evidence of malignancy but this was PET positive and for that reason he was offered the above named operation for definitive diagnosis.  The indications and risks of surgery were explained the patient gives informed consent  DICTATION: Patient is brought to the operating suite placed in supine position.  General endotracheal anesthesia was given through a double-lumen endotracheal tube.  Preoperative bronchoscopy was carried out.  There is no evidence of endobronchial tumor.  The endotracheal tube was positioned in the left main bronchus.  The patient was then turned for a right thoracotomy.  All pressure points were carefully padded.  Patient was prepped and draped in usual sterile fashion.  A posterior lateral fifth interspace thoracotomy was performed.  We did spare the serratus muscle.  The latissimus was divided.  The chest was entered through the fifth interspace.  Once the chest was entered we were able to palpate the entire lung.  The only malady was the right upper lobe lesion.  Using a combination of multiple firings of an endoscopic stapler the right upper lobe lesion was completely resected.  This measured about 2 cm in size.  Once it was resected we then opened the specimen and it appeared to be grossly  consistent with a fungus ball.  We sent the material to the pathologist who performed a frozen section and there was no evidence of malignancy seen.  However they felt that this was mostly fibrous tissue surrounding some hemosiderin and some inflammation.  There is nothing to suggest an inflammatory pseudotumor as well.  We began opening the fissure while the frozen section was being complete.  We stopped at the point of the frozen section diagnosis.  I discussed the case with Dr. Bryan Lemma in pathology and Dr. Tasia Catchings in oncology and we elected to stop with the frozen section diagnosis of a fibrous type lesion without evidence of malignancy.  We did not remove the upper lobe.  We then placed 2 chest tubes in standard fashion and brought those out through separate stab wounds inferiorly.  Hemostasis was complete.  We irrigated the chest and then closed.  #2 Vicryl was used to approximate the ribs.  Once the ribs were closed the chest tubes were hooked to suction.  Exparel was used for pain management along the paravertebral and along the intercostal spaces.  The serratus muscle was then allowed to return to its normal anatomic position.  The latissimus muscle was reapproximated #2 Vicryl.  Subcutaneous tissues with 2-0 Vicryl and the skin with skin clips.  Sterile dressings were then applied.  The patient was rolled in the supine position where he was extubated and taken to the recovery room in stable condition.    [name], MD

## 2018-08-13 NOTE — Progress Notes (Signed)
Patient continues to rest, no acute distress.  Awaiting  Bed in ICU. Curtain pulled for privacy in pacu area.  Reviewing admit orders and continuing to monitor patient.  Patient calm, resting and in no acute distress.  Family Updated to reason for holding in pacu.  DR. Oakes aware of patient status and holding in pacu for icu open bed. He spoke in person with ICU intensivist.

## 2018-08-13 NOTE — Progress Notes (Signed)
Wife at bedside for a short visit.  Patient resting on and off. Continue to wait or ICU room.

## 2018-08-13 NOTE — Progress Notes (Signed)
Pharmacy called in reference to meds that say due on MAR..  Will send appropriate ones for [place] to give in  PACU.

## 2018-08-13 NOTE — Anesthesia Preprocedure Evaluation (Signed)
Anesthesia Evaluation  Patient identified by MRN, date of birth, ID band Patient awake    Reviewed: Allergy & Precautions, H&P , NPO status , Patient's Chart, lab work & pertinent test results, reviewed documented beta blocker date and time   History of Anesthesia Complications Negative for: history of anesthetic complications  Airway Mallampati: III  TM Distance: >3 FB Neck ROM: full    Dental  (+) Dental Advidsory Given, Missing, Poor Dentition   Pulmonary neg shortness of breath, COPD, neg recent URI, Current Smoker,           Cardiovascular Exercise Tolerance: Good hypertension, (-) angina(-) CAD, (-) Past MI, (-) Cardiac Stents and (-) CABG (-) dysrhythmias (-) Valvular Problems/Murmurs     Neuro/Psych PSYCHIATRIC DISORDERS negative neurological ROS     GI/Hepatic negative GI ROS, Neg liver ROS,   Endo/Other  negative endocrine ROS  Renal/GU Renal disease (kidney stones)  negative genitourinary   Musculoskeletal   Abdominal   Peds  Hematology negative hematology ROS (+)   Anesthesia Other Findings Past Medical History: No date: Alcohol abuse     Comment:  usually drinks 2-3 drinks per day 06/2018: Atherosclerosis 06/2018: Cancer [facility])     Comment:  evaluating for lung cancer 06/2018: Emphysema of lung ([place])     Comment:  patient unaware of this. 05/2018: History of kidney stones     Comment:  per xray, bilateral nephrolitiasis No date: Hypertension   Reproductive/Obstetrics negative OB ROS                             Anesthesia Physical  Anesthesia Plan  ASA: III  Anesthesia Plan: General   Post-op Pain Management:    Induction: Intravenous  PONV Risk Score and Plan: 1 and Ondansetron, Dexamethasone, Midazolam, Promethazine and Treatment may vary due to age or medical condition  Airway Management Planned: Oral ETT  Additional Equipment:   Intra-op Plan:    Post-operative Plan: Extubation in OR  Informed Consent: I have reviewed the patients History and Physical, chart, labs and discussed the procedure including the risks, benefits and alternatives for the proposed anesthesia with the patient or authorized representative who has indicated his/her understanding and acceptance.     Dental Advisory Given  Plan Discussed with: Anesthesiologist, CRNA and Surgeon  Anesthesia Plan Comments:         Anesthesia Quick Evaluation

## 2018-08-13 NOTE — Transfer of Care (Signed)
Immediate Anesthesia Transfer of Care Note  Patient: Samuel Choi  Procedure(s) Performed: PREOP BROCHOSCOPY WITH RIGHT THORACOTOMY AND RUL RESECTION (Right )  Patient Location: PACU  Anesthesia Type:General  Level of Consciousness: drowsy and patient cooperative  Airway & Oxygen Therapy: Patient Spontanous Breathing and Patient connected to face mask oxygen  Post-op Assessment: Report given to RN and Post -op Vital signs reviewed and stable  Post vital signs: Reviewed and stable  Last Vitals:  Vitals Value Taken Time  BP 137/82 08/13/2018 11:26 AM  Temp    Pulse 89 08/13/2018 11:31 AM  Resp 20 08/13/2018 11:31 AM  SpO2 99 % 08/13/2018 11:31 AM  Vitals shown include unvalidated device data.  Last Pain:  Vitals:   08/13/18 0630  TempSrc: Oral  PainSc: 0-No pain         Complications: No apparent anesthesia complications

## 2018-08-13 NOTE — Progress Notes (Signed)
Patient continues to rest, sleeping on and off, comfortable. Family updated patient is resting comfortably, continue To wait in waiting room.  No acute distress.  Continue To wait for ICU open bed.  Medication orders released From pharmacy.

## 2018-08-13 NOTE — Interval H&P Note (Signed)
History and Physical Interval Note:  08/13/2018 7:17 AM  Samuel Choi  has presented today for surgery, with the diagnosis of LUNG MASS  The various methods of treatment have been discussed with the patient and family. After consideration of risks, benefits and other options for treatment, the patient has consented to  Procedure(s): PREOP BROCHOSCOPY WITH RIGHT THORACOTOMY AND RUL RESECTION (Right) as a surgical intervention .  The patient's history has been reviewed, patient examined, no change in status, stable for surgery.  I have reviewed the patient's chart and labs.  Questions were answered to the patient's satisfaction.     [name]

## 2018-08-13 NOTE — Anesthesia Post-op Follow-up Note (Signed)
Anesthesia QCDR form completed.        

## 2018-08-13 NOTE — Anesthesia Procedure Notes (Signed)
Procedure Name: Intubation Date/Time: 08/13/2018 7:42 AM Performed by: Jackalyn Lombard, RN Pre-anesthesia Checklist: Patient identified, Emergency Drugs available, Suction available, Patient being monitored and Timeout performed Patient Re-evaluated:Patient Re-evaluated prior to induction Oxygen Delivery Method: Circle system utilized Preoxygenation: Pre-oxygenation with 100% oxygen Induction Type: IV induction Ventilation: Mask ventilation without difficulty Laryngoscope Size: Mac and 4 Grade View: Grade I Endobronchial tube: Left, Double lumen EBT, EBT position confirmed by auscultation and EBT position confirmed by fiberoptic bronchoscope and 39 Fr Number of attempts: 1 Airway Equipment and Method: Stylet Placement Confirmation: ETT inserted through vocal cords under direct vision,  positive ETCO2 and breath sounds checked- equal and bilateral Secured at: 31 cm Tube secured with: Tape Dental Injury: Teeth and Oropharynx as per pre-operative assessment

## 2018-08-14 ENCOUNTER — Encounter: Payer: Self-pay | Admitting: Cardiothoracic Surgery

## 2018-08-14 ENCOUNTER — Inpatient Hospital Stay: Payer: BLUE CROSS/BLUE SHIELD

## 2018-08-14 LAB — BASIC METABOLIC PANEL
Anion gap: 8 (ref 5–15)
BUN: 7 mg/dL (ref 6–20)
CO2: 25 mmol/L (ref 22–32)
Calcium: 8.9 mg/dL (ref 8.9–10.3)
Chloride: 103 mmol/L (ref 98–111)
Creatinine, Ser: 0.98 mg/dL (ref 0.61–1.24)
GFR calc Af Amer: >60 mL/min (ref >60)
GFR calc non Af Amer: >60 mL/min (ref >60)
Glucose, Bld: 111 mg/dL — ABNORMAL HIGH (ref 70–99)
POTASSIUM: 3.6 mmol/L (ref 3.5–5.1)
Sodium: 136 mmol/L (ref 135–145)

## 2018-08-14 LAB — PREPARE RBC (CROSSMATCH)

## 2018-08-14 LAB — TYPE AND SCREEN
ABO/RH(D): A POS
Antibody Screen: NEGATIVE
Unit division: 0
Unit division: 0

## 2018-08-14 LAB — CBC
HCT: 38.5 % — ABNORMAL LOW (ref 39.0–52.0)
Hemoglobin: 13 g/dL (ref 13.0–17.0)
MCH: 34.1 pg — AB (ref 26.0–34.0)
MCHC: 33.8 g/dL (ref 30.0–36.0)
MCV: 101 fL — ABNORMAL HIGH (ref 80.0–100.0)
Platelets: 166 10*3/uL (ref 150–400)
RBC: 3.81 MIL/uL — AB (ref 4.22–5.81)
RDW: 12.3 % (ref 11.5–15.5)
WBC: 16.6 10*3/uL — ABNORMAL HIGH (ref 4.0–10.5)
nRBC: 0 % (ref 0.0–0.2)

## 2018-08-14 LAB — BPAM RBC
Blood Product Expiration Date: 202003022359
Blood Product Expiration Date: 202003022359
Unit Type and Rh: 6200
Unit Type and Rh: 6200

## 2018-08-14 LAB — ACID FAST SMEAR (AFB, MYCOBACTERIA): Acid Fast Smear: NEGATIVE

## 2018-08-14 MED ORDER — HYDRALAZINE HCL 20 MG/ML IJ SOLN
10.0000 mg | Freq: Once | INTRAMUSCULAR | Status: AC
  Administered 2018-08-14: 10 mg via INTRAVENOUS

## 2018-08-14 MED ORDER — HYDRALAZINE HCL 20 MG/ML IJ SOLN
10.0000 mg | Freq: Four times a day (QID) | INTRAMUSCULAR | Status: DC | PRN
  Administered 2018-08-14: 10 mg via INTRAVENOUS

## 2018-08-14 MED ORDER — HYDRALAZINE HCL 20 MG/ML IJ SOLN
10.0000 mg | INTRAMUSCULAR | Status: DC | PRN
  Administered 2018-08-20 – 2018-08-21 (×3): 10 mg via INTRAVENOUS
  Filled 2018-08-14 (×3): qty 1

## 2018-08-14 MED ORDER — IRBESARTAN 150 MG PO TABS
75.0000 mg | ORAL_TABLET | Freq: Every day | ORAL | Status: DC
  Administered 2018-08-14 – 2018-08-24 (×10): 75 mg via ORAL
  Filled 2018-08-14 (×10): qty 1

## 2018-08-14 MED ORDER — TRAMADOL HCL 50 MG PO TABS
100.0000 mg | ORAL_TABLET | Freq: Four times a day (QID) | ORAL | Status: DC
  Administered 2018-08-14 – 2018-08-24 (×38): 100 mg via ORAL
  Filled 2018-08-14 (×40): qty 2

## 2018-08-14 MED ORDER — OXYCODONE-ACETAMINOPHEN 5-325 MG PO TABS
1.0000 | ORAL_TABLET | ORAL | Status: DC | PRN
  Administered 2018-08-14: 1 via ORAL
  Administered 2018-08-14 – 2018-08-21 (×16): 2 via ORAL
  Administered 2018-08-21: 1 via ORAL
  Administered 2018-08-22 – 2018-08-23 (×7): 2 via ORAL
  Filled 2018-08-14 (×3): qty 2
  Filled 2018-08-14: qty 1
  Filled 2018-08-14 (×14): qty 2
  Filled 2018-08-14: qty 1
  Filled 2018-08-14 (×6): qty 2

## 2018-08-14 NOTE — Evaluation (Signed)
Physical Therapy Evaluation Patient Details Name: Samuel Choi MRN: [id number] DOB: [birth year] Today's Date: 08/14/2018   History of Present Illness  Pt is a 53 yo male s/p right thoracotomy with RUL wedge resection.  PMH includes: COPD, HTN, alcohol abuse, and atherosclerosis.    Clinical Impression  Pt presents with minor deficits in strength, transfers, gait, and activity tolerance but overall performed very well during the session.  Pt was Mod Ind with bed mobility tasks and CGA with transfers.  Pt was able to amb 125' with a RW and CGA with slow cadence and short B step length with good stability.  [name] SpO2 was 93% after amb with HR in the upper 90s with no adverse symptoms noted other than pain at the chest tube site that did not increase with activity.  Pt will benefit from HHPT services upon discharge to safely address above deficits for decreased risk of functional decline, decreased caregiver assistance, and eventual return to PLOF.      Follow Up Recommendations Home health PT    Equipment Recommendations  None recommended by PT    Recommendations for Other Services       Precautions / Restrictions Precautions Precautions: Fall;Other (comment)(Right side chest tubes) Restrictions Weight Bearing Restrictions: No Other Position/Activity Restrictions: HOB to 45 deg; Chest tubes may be placed to water seal for ambulation      Mobility  Bed Mobility Overal bed mobility: Modified Independent             General bed mobility comments: Extra time and effort but no physical assistance required during session  Transfers Overall transfer level: Needs assistance Equipment used: Rolling walker (2 wheeled) Transfers: Sit to/from Stand Sit to Stand: Min guard         General transfer comment: Good control and stability during sit to/from stand transfers  Ambulation/Gait Ambulation/Gait assistance: Min guard Gait Distance (Feet): 125 Feet Assistive device: Rolling  walker (2 wheeled) Gait Pattern/deviations: Step-through pattern;Decreased step length - right;Decreased step length - left Gait velocity: Decreased   General Gait Details: Decreased cadence and B step length but steady without LOB; nursing placed chest tube on water seal prior to amb and returned pt to suction at the end of the session.  Stairs            Wheelchair Mobility    Modified Rankin (Stroke Patients Only)       Balance Overall balance assessment: No apparent balance deficits (not formally assessed)                                           Pertinent Vitals/Pain Pain Assessment: 0-10 Pain Score: 8  Pain Location: Chest tube insertion site Pain Descriptors / Indicators: Aching;Sore Pain Intervention(s): Premedicated before session;Monitored during session    [place] expects to be discharged to:: Private residence Living Arrangements: Spouse/significant other Available Help at Discharge: Family;Available PRN/intermittently Type of Home: House Home Access: Stairs to enter Entrance Stairs-Rails: None Entrance Stairs-Number of Steps: 3 Home Layout: Two level;Able to live on main level with bedroom/bathroom Home Equipment: Walker - 2 wheels      Prior Function Level of Independence: Independent         Comments: Pt ind with amb community distances without AD, one fall in the last year, Ind with ADLs     Hand Dominance  Extremity/Trunk Assessment   Upper Extremity Assessment Upper Extremity Assessment: Overall WFL for tasks assessed    Lower Extremity Assessment Lower Extremity Assessment: Generalized weakness       Communication   Communication: No difficulties  Cognition Arousal/Alertness: Awake/alert Behavior During Therapy: WFL for tasks assessed/performed Overall Cognitive Status: Within Functional Limits for tasks assessed                                        General  Comments      Exercises Total Joint Exercises Ankle Circles/Pumps: AROM;Both;10 reps Quad Sets: Strengthening;Both;10 reps Gluteal Sets: Strengthening;Both;10 reps Short Arc Quad: Strengthening;Both;10 reps Hip ABduction/ADduction: AROM;Both;10 reps Straight Leg Raises: AROM;Both;10 reps Long Arc Quad: AROM;Both;10 reps Knee Flexion: AROM;Both;10 reps Marching in Standing: AROM;Both;10 reps;Standing Other Exercises Other Exercises: HEP education for BLE APs, QS, and GS x 10 each every 1-2 hrs daily; BLE SLR x 10 each 2x/day   Assessment/Plan    PT Assessment Patient needs continued PT services  PT Problem List Decreased activity tolerance;Decreased strength;Decreased knowledge of use of DME       PT Treatment Interventions DME instruction;Gait training;Stair training;Functional mobility training;Therapeutic activities;Therapeutic exercise;Balance training;Patient/family education    PT Goals (Current goals can be found in the Care Plan section)  Acute Rehab PT Goals Patient Stated Goal: To get stronger and get back home PT Goal Formulation: With patient Time For Goal Achievement: 08/27/18 Potential to Achieve Goals: Good    Frequency Min 2X/week   Barriers to discharge        Co-evaluation               AM-PAC PT "6 Clicks" Mobility  Outcome Measure Help needed turning from your back to your side while in a flat bed without using bedrails?: None Help needed moving from lying on your back to sitting on the side of a flat bed without using bedrails?: None Help needed moving to and from a bed to a chair (including a wheelchair)?: A Little Help needed standing up from a chair using your arms (e.g., wheelchair or bedside chair)?: A Little Help needed to walk in hospital room?: A Little Help needed climbing 3-5 steps with a railing? : A Little 6 Click Score: 20    End of Session Equipment Utilized During Treatment: Oxygen;Other (comment)(No gait belt donned  secondary to chest tubes and incision site) Activity Tolerance: Patient tolerated treatment well Patient left: in bed;with bed alarm set;with call bell/phone within reach;Other (comment)(Pt declined up in chair; bed elevated to 45 deg) Nurse Communication: Mobility status PT Visit Diagnosis: Muscle weakness (generalized) (M62.81)    Time: 0814-4818 PT Time Calculation (min) (ACUTE ONLY): 45 min   Charges:   PT Evaluation $PT Eval Moderate Complexity: 1 Mod PT Treatments $Therapeutic Exercise: 8-22 mins        D. Scott Lesha Jager PT, DPT 08/14/18, 4:20 PM

## 2018-08-14 NOTE — Anesthesia Postprocedure Evaluation (Signed)
Anesthesia Post Note  Patient: Samuel Choi  Procedure(s) Performed: PREOP BROCHOSCOPY WITH RIGHT THORACOTOMY AND RUL RESECTION (Right )  Patient location during evaluation: ICU Anesthesia Type: General Level of consciousness: awake and alert Pain management: pain level controlled Vital Signs Assessment: post-procedure vital signs reviewed and stable Respiratory status: spontaneous breathing, nonlabored ventilation, respiratory function stable and patient connected to nasal cannula oxygen Cardiovascular status: blood pressure returned to baseline and stable Postop Assessment: no apparent nausea or vomiting Anesthetic complications: no     Last Vitals:  Vitals:   08/14/18 0630 08/14/18 0700  BP: (!) 173/91 (!) 170/87  Pulse: 93 93  Resp: (!) 22 (!) 21  Temp:  37.2 C  SpO2: 99% 98%    Last Pain:  Vitals:   08/14/18 0746  TempSrc:   PainSc: 8                  Jenifer Struve,  Clearnce Sorrel

## 2018-08-14 NOTE — Progress Notes (Signed)
Report given to RN [facility] for patient to be transferred out to room 201. Patient offered to contact family and patient notified his wife on his own.

## 2018-08-14 NOTE — Progress Notes (Signed)
CRITICAL CARE NOTE  CC  follow up post thoracotomy  SUBJECTIVE Patient improved, eating regular diet      SIGNIFICANT EVENTS -transitioned to home BP regimen,  -PO diet started    BP (!) 167/80   Pulse 95   Temp 98.9 F (37.2 C) (Oral)   Resp 19   Ht 6\' 1"  (1.854 m)   Wt 67.3 kg   SpO2 96%   BMI 19.57 kg/m    REVIEW OF SYSTEMS  10 sys ROS reviewed and is negative except as per HPI  PHYSICAL EXAMINATION:  GENERAL:NAD HEAD: Normocephalic, atraumatic.  EYES: Pupils equal, round, reactive to light.  No scleral icterus.  MOUTH: Moist mucosal membrane. NECK: Supple. No thyromegaly. No nodules. No JVD.  PULMONARY: decreased breath sounds on right lung CARDIOVASCULAR: S1 and S2. Regular rate and rhythm. No murmurs, rubs, or gallops.  GASTROINTESTINAL: Soft, nontender, -distended. No masses. Positive bowel sounds. No hepatosplenomegaly.  MUSCULOSKELETAL: No swelling, clubbing, or edema.  NEUROLOGIC: obtunded, GCS14 SKIN:intact,warm,dry      Indwelling Urinary Catheter continued, requirement due to   Reason to continue Indwelling Urinary Catheter for strict Intake/Output monitoring for hemodynamic instability   Central Line continued, requirement due to   Reason to continue [employer] Monitoring of central venous pressure or other hemodynamic parameters   Ventilator continued, requirement due to, resp failure    Ventilator Sedation RASS 0 to -2      ASSESSMENT AND PLAN SYNOPSIS  Hypertensive urgency    -resolved , on home regimen.  Outpatient follow up    Right thoracotomy  - s/p CT surg eval - cleared for d/c from MICU       CARDIAC ICU monitoring   GI/Nutrition GI PROPHYLAXIS as indicated DIET-->TF's as tolerated Constipation protocol as indicated  ENDO - ICU hypoglycemic\Hyperglycemia protocol -check FSBS per protocol   ELECTROLYTES -follow labs as needed -replace as needed -pharmacy consultation and  following   DVT/GI PRX ordered TRANSFUSIONS AS NEEDED MONITOR FSBS ASSESS the need for LABS as needed      Ottie Glazier, M.D.  Pulmonary & Critical Care Medicine

## 2018-08-14 NOTE — Progress Notes (Signed)
Patient ID: Samuel Choi, male   DOB: February 07, 1966, 53 y.o.   MRN: [id number]   No real complaints this morning.  Not short of breath.  Pain is under good control.  Lungs equal bilaterallly and clear Heart regular  There is a small air leak.  Serous drainage.  CXRay from today looks good.  Will transfer to floor Remove Foley Keep chest tube to suction

## 2018-08-15 ENCOUNTER — Encounter: Payer: Self-pay | Admitting: *Deleted

## 2018-08-15 LAB — AEROBIC CULTURE W GRAM STAIN (SUPERFICIAL SPECIMEN)

## 2018-08-15 LAB — AEROBIC CULTURE  (SUPERFICIAL SPECIMEN): CULTURE: NO GROWTH

## 2018-08-15 NOTE — Progress Notes (Signed)
Dr. Genevive Bi called the office today and wants to add this patient on the surgery schedule for 08-20-18 for a right upper lobectomy. Dr. Caroleen Hamman will be assisting with this case (he is aware).   Patient is currently an inpatient in Room 201 and should remain an inpatient until surgery next week per Dr. Genevive Bi.    Secure chat message sent to Sharmon Leyden in the O.R. about surgery plans. Surgery scheduling form was also faxed.  Dr. Genevive Bi was also provided with Dianna's number per his request. He will be calling to speak with her about this case.   Secure chat message sent to Dr. Genevive Bi today to asking him to complete orders for surgery.

## 2018-08-15 NOTE — Plan of Care (Addendum)
  Problem: Activity: Goal: Risk for activity intolerance will decrease Outcome: Progressing   Problem: Pain Managment: Goal: General experience of comfort will improve Outcome: Progressing  Pain controlled by prn and scheduled p.o medications   Problem: Safety: Goal: Ability to remain free from injury will improve Outcome: Progressing

## 2018-08-15 NOTE — Consult Note (Signed)
Pulmonary Medicine Consultation           Date: 08/15/2018,   MRN# [id number] Samuel Choi 23-Apr-1966 Code Status:     Code Status Orders  (From admission, onward)         Start     Ordered   08/13/18 1420  Full code  Continuous     08/13/18 1419        Code Status History    This patient has a current code status but no historical code status.          CHIEF COMPLAINT:   Lung mass, post thoracotomy   HISTORY OF PRESENT ILLNESS   This is a pleasant 53 year old male he has a background history of chronic rhinitis, essential hypertension actively smoking tobacco, and right upper lobe lung mass.  He was seen by Dr. Faith Rogue for right upper lobe resection and was subsequently transferred to MICU for upgrad in care.  Upon arrival patient is in hypertensive urgency with blood pressure 190/100.  Patient states he has 5 antihypertensives which is difficult for him to keep up with however he relates that usually his blood pressure is within reference range. Smokes approximately half a pack has been doing so for the past 20 or so years.      PAST MEDICAL HISTORY   Past Medical History:  Diagnosis Date  . Alcohol abuse    usually drinks 2-3 drinks per day  . Atherosclerosis 06/2018  . Cancer ([place]) 06/2018   evaluating for lung cancer  . Chronic sinusitis   . Emphysema of lung ([place]) 06/2018   patient unaware of this.  . Hip fracture ([place]) 05/2018   no surgery  . History of kidney stones 05/2018   per xray, bilateral nephrolitiasis  . Hypertension      SURGICAL HISTORY   Past Surgical History:  Procedure Laterality Date  . BRAIN SURGERY  10/2017   nasal/sinus endoscopy. mass benign  . ELECTROMAGNETIC NAVIGATION BROCHOSCOPY Right 06/25/2018   Procedure: ELECTROMAGNETIC NAVIGATION BRONCHOSCOPY;  Surgeon: [name], MD;  Location: [facility] ORS;  Service: Cardiopulmonary;  Laterality: Right;  . ENDOBRONCHIAL ULTRASOUND Right 06/25/2018   Procedure:  ENDOBRONCHIAL ULTRASOUND;  Surgeon: [name], MD;  Location: [facility] ORS;  Service: Cardiopulmonary;  Laterality: Right;  . NASAL SINUS SURGERY  10/2017   At [facility], frontal sinusotomy, ethmoidectomy, resection anterior cranial fossa neoplasm, turbinate resection  . THORACOTOMY Right 08/13/2018   Procedure: PREOP BROCHOSCOPY WITH RIGHT THORACOTOMY AND RUL RESECTION;  Surgeon: [name], MD;  Location: [facility] ORS;  Service: General;  Laterality: Right;  . TOE SURGERY Left    pin in left toe     FAMILY HISTORY   Family History  Problem Relation Age of Onset  . Breast cancer Mother   . Diabetes Mother   . Lung cancer Father   . Hypertension Father      SOCIAL HISTORY   Social History   Tobacco Use  . Smoking status: Current Every Day Smoker    Packs/day: 0.50    Years: 15.00    Pack years: 7.50    Types: Cigarettes  . Smokeless tobacco: Never Used  . Tobacco comment: attempting to cut back.Not really interested in quitting  Substance Use Topics  . Alcohol use: Yes    Alcohol/week: 3.0 standard drinks    Types: 3 Cans of beer per week    Comment: usually 2 drinks per week, per patient  . Drug use: No  MEDICATIONS    Home Medication:    Current Medication:  Current Facility-Administered Medications:  .  albuterol (PROVENTIL) (2.5 MG/3ML) 0.083% nebulizer solution 2.5 mg, 2.5 mg, Nebulization, Q4H while awake, Tylene Fantasia, PA-C, 2.5 mg at 08/15/18 1144 .  amLODipine (NORVASC) tablet 10 mg, 10 mg, Oral, Daily, Tylene Fantasia, PA-C, 10 mg at 08/15/18 0810 .  bisacodyl (DULCOLAX) EC tablet 10 mg, 10 mg, Oral, Daily, Tylene Fantasia, PA-C, 10 mg at 08/15/18 0810 .  cloNIDine (CATAPRES) tablet 0.1 mg, 0.1 mg, Oral, Daily, Tylene Fantasia, PA-C, 0.1 mg at 08/15/18 2703 .  dextrose 5 % and 0.45 % NaCl with KCl 20 mEq/L infusion, , Intravenous, Continuous, Tylene Fantasia, PA-C, Stopped at 08/15/18 5009 .  hydrALAZINE (APRESOLINE) injection 10 mg, 10 mg,  Intravenous, Q4H PRN, Tylene Fantasia, PA-C .  hydrALAZINE (APRESOLINE) tablet 100 mg, 100 mg, Oral, BID, Tylene Fantasia, PA-C, 100 mg at 08/15/18 0810 .  irbesartan (AVAPRO) tablet 75 mg, 75 mg, Oral, Daily, Tylene Fantasia, PA-C, 75 mg at 08/15/18 3818 .  methocarbamol (ROBAXIN) tablet 500 mg, 500 mg, Oral, Q6H, Tylene Fantasia, PA-C, 500 mg at 08/15/18 2993 .  ondansetron (ZOFRAN) injection 4 mg, 4 mg, Intravenous, Q6H PRN, Tylene Fantasia, PA-C .  oxyCODONE-acetaminophen (PERCOCET/ROXICET) 5-325 MG per tablet 1-2 tablet, 1-2 tablet, Oral, Q4H PRN, [name], MD, 2 tablet at 08/15/18 0810 .  traMADol (ULTRAM) tablet 100 mg, 100 mg, Oral, Q6H, Oaks, Timothy, MD, 100 mg at 08/15/18 1118  Facility-Administered Medications Ordered in Other Encounters:  .  albuterol (PROVENTIL) (2.5 MG/3ML) 0.083% nebulizer solution 2.5 mg, 2.5 mg, Nebulization, Once, System, Provider Not In    ALLERGIES   Patient has no known allergies.     REVIEW OF SYSTEMS    Review of Systems:  Gen:  Denies  fever, sweats, chills weigh loss  HEENT: Denies blurred vision, double vision, ear pain, eye pain, hearing loss, nose bleeds, sore throat Cardiac:  No dizziness, chest pain or heaviness, chest tightness,edema Resp:   Denies cough or sputum porduction, shortness of breath,wheezing, hemoptysis,  Gi: Denies swallowing difficulty, stomach pain, nausea or vomiting, diarrhea, constipation, bowel incontinence Gu:  Denies bladder incontinence, burning urine Ext:   Denies Joint pain, stiffness or swelling Skin: Denies  skin rash, easy bruising or bleeding or hives Endoc:  Denies polyuria, polydipsia , polyphagia or weight change Psych:   Denies depression, insomnia or hallucinations   Other:  All other systems negative   VS: BP (!) 155/89 (BP Location: Left Arm)   Pulse 90   Temp 98.4 F (36.9 C) (Oral)   Resp 18   Ht 6\' 1"  (1.854 m)   Wt 67.3 kg   SpO2 96%   BMI 19.57 kg/m       PHYSICAL EXAM  Physical Examination:   GENERAL:NAD, no fevers, chills, no weakness no fatigue HEAD: Normocephalic, atraumatic.  EYES: Pupils equal, round, reactive to light. Extraocular muscles intact. No scleral icterus.  MOUTH: Moist mucosal membrane. Dentition intact. No abscess noted.  EAR, NOSE, THROAT: Clear without exudates. No external lesions.  NECK: Supple. No thyromegaly. No nodules. No JVD.  PULMONARY:Right lung with chest tube , +rhonchi at mid-upper lung zone CARDIOVASCULAR: S1 and S2. Regular rate and rhythm. No murmurs, rubs, or gallops. No edema. Pedal pulses 2+ bilaterally.  GASTROINTESTINAL: Soft, nontender, nondistended. No masses. Positive bowel sounds. No hepatosplenomegaly.  MUSCULOSKELETAL: No swelling, clubbing, or edema. Range of motion full in all  extremities.  NEUROLOGIC: Cranial nerves II through XII are intact. No gross focal neurological deficits. Sensation intact. Reflexes intact.  SKIN: No ulceration, lesions, rashes, or cyanosis. Skin warm and dry. Turgor intact.  PSYCHIATRIC: Mood, affect within normal limits. The patient is awake, alert and oriented x 3. Insight, judgment intact.       IMAGING    Chest 2 View  Result Date: 08/13/2018 CLINICAL DATA:  Preop for right lobectomy. EXAM: CHEST - 2 VIEW COMPARISON:  07/08/2018 FINDINGS: Known right upper lobe nodule. There is no edema, consolidation, effusion, or pneumothorax. Normal heart size and mediastinal contours-there is hilar adenopathy by PET. No acute osseous finding. IMPRESSION: 1. No evidence of acute cardiopulmonary disease. 2. Known right upper lobe nodule. Electronically Signed   By: Monte Fantasia M.D.   On: 08/13/2018 07:06   Mr Jeri Cos QV Contrast  Result Date: 07/27/2018 CLINICAL DATA:  53 year old male with lung nodule suspicious for bronchogenic carcinoma on PET-CT last month. Staging. EXAM: MRI HEAD WITHOUT AND WITH CONTRAST TECHNIQUE: Multiplanar, multiecho pulse sequences of  the brain and surrounding structures were obtained without and with intravenous contrast. CONTRAST:  7.5 milliliters Gadavist COMPARISON:  PET-CT 06/13/2018. FINDINGS: Brain: Cavum septum pellucidum, normal variant. Left anterior frontal lobe developmental venous anomaly (normal variant series 11, image 32). No abnormal enhancement identified. No midline shift, mass effect, or evidence of intracranial mass lesion. No dural thickening. No restricted diffusion to suggest acute infarction. No ventriculomegaly, extra-axial collection or acute intracranial hemorrhage. Cervicomedullary junction and pituitary are within normal limits. Minimal to mild for age scattered nonspecific cerebral white matter T2 and FLAIR hyperintensity in both hemispheres. No cortical encephalomalacia or chronic cerebral blood products. Vascular: Major intracranial vascular flow voids are preserved, the distal left vertebral artery appears dominant. The major dural venous sinuses are enhancing and appear patent. Skull and upper cervical spine: Normal bone marrow signal. Negative visible cervical spine and spinal cord. Sinuses/Orbits: Disconjugate gaze. Otherwise negative orbits. Previous bilateral paranasal sinus surgery with mild mucoperiosteal thickening. Other: Mastoids are clear. Visible internal auditory structures appear normal. Scalp and face soft tissues are within normal limits and the subtle left parotid gland lesion on PET-CT is poorly correlated; might beyond series 3, image 20 (uncertain). IMPRESSION: 1.  No metastatic disease or acute intracranial abnormality. 2. Subtle left parotid gland lesion described on the prior PET-CT is poorly correlated, which is somewhat reassuring. Attention on repeat PET-CT or follow-up neck CT with IV contrast recommended. Electronically Signed   By: Genevie Ann M.D.   On: 07/27/2018 14:05   Dg Chest Port 1 View  Result Date: 08/14/2018 CLINICAL DATA:  Chest tubes in place.  Postoperative thoracotomy  EXAM: PORTABLE CHEST 1 VIEW COMPARISON:  August 13, 2018 FINDINGS: Patient is status post thoracotomy on the right. There is probable contusion near the staples seen in the right upper lobe region. Chest tubes remain on the right, unchanged in position. There is subcutaneous air on the right, but no evident pneumothorax. There is atelectatic change in the left lower lobe, stable. No new opacity evident. Heart size and pulmonary vascularity are normal. No adenopathy. No bone lesions appreciable. IMPRESSION: Postoperative changes on the right. Suspect mild contusion near the staples in the right upper lobe region. Chest tubes unchanged in position on the right. There is subcutaneous air but no apparent pneumothorax. There is stable atelectasis left base. No new opacity. Stable cardiac silhouette. Electronically Signed   By: Lowella Grip III M.D.   On: 08/14/2018  07:45   Dg Chest Port 1 View  Result Date: 08/13/2018 CLINICAL DATA:  Postop check. EXAM: PORTABLE CHEST 1 VIEW COMPARISON:  Radiograph of August 12, 2018. FINDINGS: Stable cardiomediastinal silhouette. Two right-sided chest tubes are noted with minimal right basilar pneumothorax. Mild subcutaneous emphysema is noted. Postsurgical changes are seen in right upper lobe. Mild left basilar subsegmental atelectasis is noted. Bony thorax is unremarkable. IMPRESSION: Two right-sided chest tubes are noted with minimal right basilar pneumothorax. Postsurgical changes seen in right upper lobe. Mild left basilar subsegmental atelectasis. Electronically Signed   By: Marijo Conception, M.D.   On: 08/13/2018 12:18      ASSESSMENT/PLAN   Right Lung Mass     -post thoracotomy - pathology pending       - chest tube management - good tidal motion , no air leak, serang drainage active                     CT surgery on case      -s/p MICU d/c -       - surgical site clean - mild expected pain       COPD    -Symbicort bid , spiriva daily    - incetive  spirometry at bedside  Encourage to use several times each hour    - outpatient follow up    Allergic rhinitis    -flonase , outpatient follow up    Thank you for consultation , patient may follow up with Dr Mortimer Fries on outpatient   Patient/Family are satisfied with Plan of action and management. All questions answered      Ottie Glazier, MD Division of Pulmonary and Critical Care Medicine 12:05 PM 08/15/2018

## 2018-08-15 NOTE — Progress Notes (Signed)
  Patient ID: Samuel Choi, male   DOB: 01-09-1966, 53 y.o.   MRN: [id number]  HISTORY: He did reasonably well overnight.  He still has had no bowel movement and feels slightly distended.  He is not short of breath and he states that his pain is moderate but controlled.   Vitals:   08/15/18 1144 08/15/18 1258  BP:  131/77  Pulse:  91  Resp:  17  Temp:  98.7 F (37.1 C)  SpO2: 96% 97%     EXAM:    Resp: Lungs are clear bilaterally but diminished.  No respiratory distress, normal effort. Heart:  Regular without murmurs Abd:  Abdomen is soft, slightly distended and non tender. No masses are palpable.  There is no rebound and no guarding.  Neurological: Alert and oriented to person, place, and time. Coordination normal.  Skin: Skin is warm and dry. No rash noted. No diaphoretic. No erythema. No pallor.  Psychiatric: Normal mood and affect. Normal behavior. Judgment and thought content normal.    ASSESSMENT: There is a small air leak still present from the chest tubes.   PLAN:   I had a meeting today with the patient and his wife along with Dr. Tasia Catchings from oncology.  The 4 of [place] met in his room and reviewed the results of the frozen sections that were sent in the operating room.  These were initially read as being negative for carcinoma but the permanent sections do reveal high-grade neoplasm.  The pathologist Dr. Bryan Lemma was unable to tell us today whether the lung was the site of origin or whether this may even represent some other type of tumor with metastases to the lung.  Because the tumor was incompletely resected we had a long discussion with the patient today.  He understands that any surgical resection would be best performed as soon as possible.  He also understands that because the pathologist is unable to make a definitive diagnosis at this point that additional studies may be required and additional time may be required to determine the exact etiology.  The pathologist states  that the tumor is poorly differentiated and we may not be able to tell the exact type of tumor despite more work-up.  I explained to the patient and Dr. Tasia Catchings made it clear that he is likely require radiation therapy and chemotherapy whether or not he undergoes additional surgery.  He understands that and we also discussed the placement of a Port-A-Cath for his chemotherapy.  I did explain to him that I thought that any surgical intervention would carry the same risks as the first time around but that additional surgery may be more complicated.  He understands that and would like [place] to continue to proceed.  We will work diligently to find the source of his tumor and to get him back on the schedule for additional resection.    [name], MD

## 2018-08-16 ENCOUNTER — Other Ambulatory Visit: Payer: Self-pay | Admitting: Oncology

## 2018-08-16 MED ORDER — ALBUTEROL SULFATE (2.5 MG/3ML) 0.083% IN NEBU
2.5000 mg | INHALATION_SOLUTION | Freq: Three times a day (TID) | RESPIRATORY_TRACT | Status: DC
  Administered 2018-08-16 – 2018-08-20 (×11): 2.5 mg via RESPIRATORY_TRACT
  Administered 2018-08-20: 8 mg via RESPIRATORY_TRACT
  Filled 2018-08-16 (×10): qty 3

## 2018-08-16 NOTE — Care Management (Signed)
Patient 3 days s/p right thoracotomy and RUL wedge resection.  Plan for return to OR for thoracotomy and completion right upper lobectomy on 2/11.  Patient lives at home with wife.  PCP Hande.  PT has assessed patient and recommends home health PT.  Patient has a RW in the home. My coworkers has spoken with the patient, and he is unsure if he will need home PT at discharge.  RNCM following for needs at discharge

## 2018-08-16 NOTE — Progress Notes (Signed)
Brief Progress Note Patient resting comfortably in bed. No complaints of fever, chills, nausea, emesis, CP, or SOB. Has been tolerating a diet. Mobilizing well.   Lungs a CTAB. HRRR without m/r/g. thoracotomy incision is CDI without surrounding erythema or drainage. Chest tube with 160 ccs out (serosanguinous), small air leak  Samuel Choi is a 53 y.o. male doing well 3 days s/p right thoracotomy and RUL wedge resection.    - Plan for return to OR for thoracotomy and completion right upper lobectomy, which patient is agreeable with  - Continue Chest tubes to suction (-20cm), monitor output.   - Continue pain control, regular diet - Mobilization encouraged  - Aggressive pulmonary toilet.   - DVT prophylaxis  -- Edison Simon, PA-C [facility] 08/16/2018, 7:14 AM [phone] M-F: 7am - 4pm

## 2018-08-17 LAB — BASIC METABOLIC PANEL
Anion gap: 8 (ref 5–15)
BUN: 12 mg/dL (ref 6–20)
CALCIUM: 9 mg/dL (ref 8.9–10.3)
CO2: 28 mmol/L (ref 22–32)
CREATININE: 1.09 mg/dL (ref 0.61–1.24)
Chloride: 94 mmol/L — ABNORMAL LOW (ref 98–111)
GFR calc Af Amer: >60 mL/min (ref >60)
Glucose, Bld: 114 mg/dL — ABNORMAL HIGH (ref 70–99)
Potassium: 3.6 mmol/L (ref 3.5–5.1)
Sodium: 130 mmol/L — ABNORMAL LOW (ref 135–145)

## 2018-08-17 LAB — MAGNESIUM: Magnesium: 1.7 mg/dL (ref 1.7–2.4)

## 2018-08-17 NOTE — Progress Notes (Signed)
Notified by [facility], patient had a 6 beat run of VTach. Patient A&O and in no sign of distress. Said he feels fine. Notified Dr Genevive Bi and Dr Hampton Abbot. Dr Hampton Abbot will place new orders.

## 2018-08-17 NOTE — Progress Notes (Signed)
PT Cancellation Note  Patient Details Name: Samuel Choi MRN: [id number] DOB: Nov 25, 1965   Cancelled Treatment:    Reason Eval/Treat Not Completed: Patient declined, no reason specified   Pt in bed and in good spirits.  Stating he has been walking daily with nursing staff and has no concerns with his mobility.  Declined intervention at this time.  Encouraged him to continue daily walks with nursing.  Will continue as appropriate.   Chesley Noon 08/17/2018, 12:05 PM

## 2018-08-17 NOTE — Progress Notes (Signed)
08/17/2018  Subjective: Patient is 4 Days Post-Op s/p right thoracotomy and RUL wedge resection.  Has two chest tubes in place.  Doing well.  Denies any significant pain, and is able to ambulate.  Denies any shortness of breath.  Vital signs: Temp:  [98.6 F (37 C)-98.9 F (37.2 C)] 98.6 F (37 C) (02/08 1334) Pulse Rate:  [93-104] 98 (02/08 1334) Resp:  [20] 20 (02/08 1334) BP: (131-152)/(76-86) 131/76 (02/08 1334) SpO2:  [91 %-96 %] 96 % (02/08 1334)   Intake/Output: 02/07 0701 - 02/08 0700 In: 358 [P.O.:358] Out: 1586 [Urine:1550; Chest Tube:230] Last BM Date: 08/16/18  Physical Exam: Constitutional: No acute distress Pulm:  Right thoracotomy incision is clean, dry, intact with staples.  Chest tubes in place without any air leak.   Labs:  No results for input(s): WBC, HGB, HCT, PLT in the last 72 hours. No results for input(s): NA, K, CL, CO2, GLUCOSE, BUN, CREATININE, CALCIUM in the last 72 hours.  Invalid input(s): MAGNESIUM No results for input(s): LABPROT, INR in the last 72 hours.  Imaging: No results found.  Assessment/Plan: This is a 53 y.o. male s/p right thoracotomy and RUL wedge resection  --Continue regular diet today and tomorrow. --OOB and ambulate as tolerated --Chest tubes to suction and can place to waterseal for ambulation --Awaiting final pathology results, likely Monday.   Melvyn Neth, [facility]

## 2018-08-18 MED ORDER — MAGNESIUM SULFATE 2 GM/50ML IV SOLN
2.0000 g | Freq: Once | INTRAVENOUS | Status: AC
  Administered 2018-08-18: 2 g via INTRAVENOUS
  Filled 2018-08-18: qty 50

## 2018-08-18 MED ORDER — POTASSIUM CHLORIDE CRYS ER 20 MEQ PO TBCR
40.0000 meq | EXTENDED_RELEASE_TABLET | Freq: Once | ORAL | Status: AC
  Administered 2018-08-18: 40 meq via ORAL
  Filled 2018-08-18: qty 2

## 2018-08-18 MED ORDER — SODIUM CHLORIDE 0.9 % IV SOLN
INTRAVENOUS | Status: DC | PRN
  Administered 2018-08-18: 250 mL via INTRAVENOUS

## 2018-08-18 MED ORDER — BACLOFEN 10 MG PO TABS
5.0000 mg | ORAL_TABLET | Freq: Three times a day (TID) | ORAL | Status: AC
  Administered 2018-08-18 – 2018-08-23 (×14): 5 mg via ORAL
  Filled 2018-08-18 (×15): qty 0.5

## 2018-08-18 NOTE — Progress Notes (Signed)
08/18/2018  Subjective: Patient is 5 Days Post-Op status post right thoracotomy and right upper lobe wedge resection.  No acute events overnight except for a 6 beat run of V. tach.  Patient during that time was alert oriented with no chest pain no symptoms.  BMP revealed a potassium of 3.6 which was repleted this morning.  Remains asymptomatic with only discomfort at the incision site itself in the chest tubes.  Vital signs: Temp:  [98.5 F (36.9 C)-98.6 F (37 C)] 98.5 F (36.9 C) (02/09 0443) Pulse Rate:  [88-98] 90 (02/09 0443) Resp:  [18-20] 18 (02/09 0443) BP: (131-149)/(76-87) 149/87 (02/09 0443) SpO2:  [96 %] 96 % (02/09 0443)   Intake/Output: 02/08 0701 - 02/09 0700 In: -  Out: 2670 [Urine:2550; Chest Tube:120] Last BM Date: 08/17/18  Physical Exam: Constitutional: No acute distress Pulm: Right thoracotomy incision is clean dry and intact with staples.  2 chest tubes coming from the right side going to Pleur-evac with no air leak and serous fluid in the Pleur-evac.  Labs:  No results for input(s): WBC, HGB, HCT, PLT in the last 72 hours. Recent Labs    08/17/18 1705  NA 130*  K 3.6  CL 94*  CO2 28  GLUCOSE 114*  BUN 12  CREATININE 1.09  CALCIUM 9.0   No results for input(s): LABPROT, INR in the last 72 hours.  Imaging: No results found.  Assessment/Plan: This is a 53 y.o. male s/p right thoracotomy with right upper lobe wedge resection.  - Pending pathology still from his resection.  Likely will have final results tomorrow and Dr. Genevive Bi will discussed with the patient the results and potential plan of action.  Currently he is tentatively scheduled for a right thoracotomy and right upper lobe lobectomy on Tuesday 2/11 but all this will depend on his final pathology as well as the patient's decision on what he would like to do.   Melvyn Neth, [facility]

## 2018-08-19 ENCOUNTER — Inpatient Hospital Stay: Payer: BLUE CROSS/BLUE SHIELD

## 2018-08-19 LAB — CBC
HCT: 36.2 % — ABNORMAL LOW (ref 39.0–52.0)
Hemoglobin: 12.2 g/dL — ABNORMAL LOW (ref 13.0–17.0)
MCH: 33.2 pg (ref 26.0–34.0)
MCHC: 33.7 g/dL (ref 30.0–36.0)
MCV: 98.4 fL (ref 80.0–100.0)
Platelets: 285 10*3/uL (ref 150–400)
RBC: 3.68 MIL/uL — ABNORMAL LOW (ref 4.22–5.81)
RDW: 11.9 % (ref 11.5–15.5)
WBC: 12.4 10*3/uL — AB (ref 4.0–10.5)
nRBC: 0 % (ref 0.0–0.2)

## 2018-08-19 LAB — COMPREHENSIVE METABOLIC PANEL
ALT: 22 U/L (ref 0–44)
ANION GAP: 9 (ref 5–15)
AST: 32 U/L (ref 15–41)
Albumin: 3.7 g/dL (ref 3.5–5.0)
Alkaline Phosphatase: 59 U/L (ref 38–126)
BUN: 13 mg/dL (ref 6–20)
CO2: 27 mmol/L (ref 22–32)
Calcium: 9.2 mg/dL (ref 8.9–10.3)
Chloride: 95 mmol/L — ABNORMAL LOW (ref 98–111)
Creatinine, Ser: 0.9 mg/dL (ref 0.61–1.24)
GFR calc Af Amer: >60 mL/min (ref >60)
GFR calc non Af Amer: >60 mL/min (ref >60)
Glucose, Bld: 121 mg/dL — ABNORMAL HIGH (ref 70–99)
POTASSIUM: 4.2 mmol/L (ref 3.5–5.1)
Sodium: 131 mmol/L — ABNORMAL LOW (ref 135–145)
Total Bilirubin: 0.6 mg/dL (ref 0.3–1.2)
Total Protein: 6.9 g/dL (ref 6.5–8.1)

## 2018-08-19 LAB — PROTIME-INR
INR: 0.97
Prothrombin Time: 12.8 seconds (ref 11.4–15.2)

## 2018-08-19 LAB — SURGICAL PATHOLOGY

## 2018-08-19 LAB — APTT: aPTT: 39 seconds — ABNORMAL HIGH (ref 24–36)

## 2018-08-19 MED ORDER — CEFAZOLIN SODIUM-DEXTROSE 2-4 GM/100ML-% IV SOLN
2.0000 g | INTRAVENOUS | Status: AC
  Administered 2018-08-20 (×2): 2 g via INTRAVENOUS
  Filled 2018-08-19 (×2): qty 100

## 2018-08-19 NOTE — Progress Notes (Signed)
PT Cancellation Note  Patient Details Name: Samuel Choi MRN: [id number] DOB: 09-26-1965   Cancelled Treatment:    Reason Eval/Treat Not Completed: Patient declined, no reason specified;Other (comment). Treatment attempted; pt pleasantly declines stating, "I'm good". Pt sitting edge of bed with visitor in the room; pt states he will be having surgery tomorrow (also noted per chart review) and does not wish PT at this time. Pt will need new orders to for PT again post surgery.    Larae Grooms, PTA 08/19/2018, 2:06 PM

## 2018-08-19 NOTE — Care Management Note (Signed)
Case Management Note  Patient Details  Name: DANON LOGRASSO MRN: [id number] Date of Birth: 07/26/1965  Subjective/Objective:                  RNCM met with patient to discuss discharge planning. Two chest tubes in places and at suction. He is not sure if he will need these at home or not at this point. He is independent at home with wife and able to drive.  His PCP is Dr. Ginette Pitman. He is not requiring supplemental O2. He has not problems paying for medications.   Action/Plan:  Home health list provided to patient via [email or web address].  [employer] application placed on patient's chart for Dr. Genevive Bi in case needed at home.   Expected Discharge Date:                  Expected Discharge Plan:     In-House Referral:     Discharge planning Services  CM Consult  Post Acute Care Choice:  Home Health, Durable Medical Equipment Choice offered to:  Patient  DME Arranged:    DME Agency:     HH Arranged:    [place] Agency:     Status of Service:  In process, will continue to follow  If discussed at Long Length of Stay Meetings, dates discussed:    Additional Comments:  Marshell Garfinkel, RN 08/19/2018, 12:59 PM

## 2018-08-19 NOTE — Anesthesia Preprocedure Evaluation (Deleted)
Anesthesia Evaluation    Airway        Dental   Pulmonary Current Smoker,           Cardiovascular hypertension,      Neuro/Psych    GI/Hepatic   Endo/Other    Renal/GU      Musculoskeletal   Abdominal   Peds  Hematology   Anesthesia Other Findings Past Medical History: No date: Alcohol abuse     Comment:  usually drinks 2-3 drinks per day 06/2018: Atherosclerosis 06/2018: Cancer [facility])     Comment:  evaluating for lung cancer No date: Chronic sinusitis 06/2018: Emphysema of lung ([place])     Comment:  patient unaware of this. 05/2018: Hip fracture ([place])     Comment:  no surgery 05/2018: History of kidney stones     Comment:  per xray, bilateral nephrolitiasis No date: Hypertension   Reproductive/Obstetrics                             Anesthesia Physical Anesthesia Plan Anesthesia Quick Evaluation

## 2018-08-19 NOTE — Progress Notes (Signed)
Patient ID: Samuel Choi, male   DOB: 01/29/1966, 53 y.o.   MRN: [id number]  No acute problems overnight.  He states that he feels pretty well overall and does not complain of any shortness of breath.  His pain is under good control.  His chest tubes remain on 20 cm water suction and there is no air leak.  His surgical incisions are clean dry and intact.  His lungs are clear and his heart is regular.  I again had a long discussion with him today regarding the options.  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  Final stains are still in process.  I explained this to the patient and also told him that we have time on the OR schedule tomorrow to complete his right upper lobectomy.  I reviewed with him the risks of bleeding, infection, air leak and death.  He understands that the surgical procedure would be more difficult with a redo thoracotomy.  I will order all of his preoperative laboratory studies.  I also left my cell phone with the patient to give to his wife if she has any further questions.

## 2018-08-19 NOTE — H&P (View-Only) (Signed)
Patient ID: Samuel Choi, male   DOB: 02/23/1966, 53 y.o.   MRN: [id number]  No acute problems overnight.  He states that he feels pretty well overall and does not complain of any shortness of breath.  His pain is under good control.  His chest tubes remain on 20 cm water suction and there is no air leak.  His surgical incisions are clean dry and intact.  His lungs are clear and his heart is regular.  I again had a long discussion with him today regarding the options.  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  Final stains are still in process.  I explained this to the patient and also told him that we have time on the OR schedule tomorrow to complete his right upper lobectomy.  I reviewed with him the risks of bleeding, infection, air leak and death.  He understands that the surgical procedure would be more difficult with a redo thoracotomy.  I will order all of his preoperative laboratory studies.  I also left my cell phone with the patient to give to his wife if she has any further questions.

## 2018-08-20 ENCOUNTER — Encounter
Admission: RE | Disposition: A | Discharge status: Home / Self Care | Payer: Self-pay | Source: Home / Self Care | Attending: Cardiothoracic Surgery

## 2018-08-20 ENCOUNTER — Inpatient Hospital Stay: Payer: BLUE CROSS/BLUE SHIELD | Admitting: Anesthesiology

## 2018-08-20 ENCOUNTER — Encounter: Payer: Self-pay | Admitting: Anesthesiology

## 2018-08-20 ENCOUNTER — Inpatient Hospital Stay: Payer: BLUE CROSS/BLUE SHIELD | Admitting: Oncology

## 2018-08-20 ENCOUNTER — Inpatient Hospital Stay: Payer: BLUE CROSS/BLUE SHIELD

## 2018-08-20 DIAGNOSIS — C3491 Malignant neoplasm of unspecified part of right bronchus or lung: Secondary | ICD-10-CM

## 2018-08-20 DIAGNOSIS — R918 Other nonspecific abnormal finding of lung field: Secondary | ICD-10-CM

## 2018-08-20 HISTORY — PX: FLEXIBLE BRONCHOSCOPY: SHX5094

## 2018-08-20 HISTORY — PX: THORACOTOMY: SHX5074

## 2018-08-20 LAB — SURGICAL PCR SCREEN
MRSA, PCR: NEGATIVE
Staphylococcus aureus: NEGATIVE

## 2018-08-20 LAB — GLUCOSE, CAPILLARY: Glucose-Capillary: 112 mg/dL — ABNORMAL HIGH (ref 70–99)

## 2018-08-20 LAB — MRSA PCR SCREENING: MRSA by PCR: NEGATIVE

## 2018-08-20 SURGERY — THORACOTOMY, MAJOR
Anesthesia: General | Laterality: Right

## 2018-08-20 MED ORDER — KETAMINE HCL 50 MG/ML IJ SOLN
INTRAMUSCULAR | Status: AC
  Filled 2018-08-20: qty 10

## 2018-08-20 MED ORDER — MIDAZOLAM HCL 2 MG/2ML IJ SOLN
INTRAMUSCULAR | Status: DC | PRN
  Administered 2018-08-20: 2 mg via INTRAVENOUS

## 2018-08-20 MED ORDER — ONDANSETRON HCL 4 MG/2ML IJ SOLN
INTRAMUSCULAR | Status: AC
  Filled 2018-08-20: qty 2

## 2018-08-20 MED ORDER — LACTATED RINGERS IV SOLN
INTRAVENOUS | Status: DC | PRN
  Administered 2018-08-20: 07:00:00 via INTRAVENOUS

## 2018-08-20 MED ORDER — EPHEDRINE SULFATE 50 MG/ML IJ SOLN
INTRAMUSCULAR | Status: AC
  Filled 2018-08-20: qty 1

## 2018-08-20 MED ORDER — HYDROMORPHONE HCL 1 MG/ML IJ SOLN
INTRAMUSCULAR | Status: AC
  Administered 2018-08-20: 0.5 mg via INTRAVENOUS
  Filled 2018-08-20: qty 1

## 2018-08-20 MED ORDER — BUPIVACAINE HCL 0.25 % IJ SOLN
INTRAMUSCULAR | Status: DC | PRN
  Administered 2018-08-20: 30 mL

## 2018-08-20 MED ORDER — ALBUTEROL SULFATE HFA 108 (90 BASE) MCG/ACT IN AERS
INHALATION_SPRAY | RESPIRATORY_TRACT | Status: AC
  Filled 2018-08-20: qty 6.7

## 2018-08-20 MED ORDER — DEXAMETHASONE SODIUM PHOSPHATE 10 MG/ML IJ SOLN
INTRAMUSCULAR | Status: DC | PRN
  Administered 2018-08-20: 4 mg via INTRAVENOUS

## 2018-08-20 MED ORDER — EPHEDRINE SULFATE 50 MG/ML IJ SOLN
INTRAMUSCULAR | Status: DC | PRN
  Administered 2018-08-20: 10 mg via INTRAVENOUS

## 2018-08-20 MED ORDER — HYDROMORPHONE HCL 1 MG/ML IJ SOLN
0.5000 mg | INTRAMUSCULAR | Status: DC | PRN
  Administered 2018-08-20: 0.5 mg via INTRAVENOUS

## 2018-08-20 MED ORDER — PROPOFOL 10 MG/ML IV BOLUS
INTRAVENOUS | Status: DC | PRN
  Administered 2018-08-20: 50 mg via INTRAVENOUS
  Administered 2018-08-20: 120 mg via INTRAVENOUS

## 2018-08-20 MED ORDER — ROCURONIUM BROMIDE 50 MG/5ML IV SOLN
INTRAVENOUS | Status: AC
  Filled 2018-08-20: qty 1

## 2018-08-20 MED ORDER — MORPHINE SULFATE (PF) 2 MG/ML IV SOLN
1.0000 mg | INTRAVENOUS | Status: DC | PRN
  Administered 2018-08-20 – 2018-08-21 (×5): 2 mg via INTRAVENOUS
  Filled 2018-08-20 (×5): qty 1

## 2018-08-20 MED ORDER — FENTANYL CITRATE (PF) 100 MCG/2ML IJ SOLN
25.0000 ug | INTRAMUSCULAR | Status: DC | PRN
  Administered 2018-08-20 (×2): 25 ug via INTRAVENOUS

## 2018-08-20 MED ORDER — DEXTROSE-NACL 5-0.45 % IV SOLN
INTRAVENOUS | Status: DC
  Administered 2018-08-20 – 2018-08-21 (×2): via INTRAVENOUS

## 2018-08-20 MED ORDER — DEXAMETHASONE SODIUM PHOSPHATE 10 MG/ML IJ SOLN
INTRAMUSCULAR | Status: AC
  Filled 2018-08-20: qty 1

## 2018-08-20 MED ORDER — LIDOCAINE HCL (PF) 2 % IJ SOLN
INTRAMUSCULAR | Status: AC
  Filled 2018-08-20: qty 10

## 2018-08-20 MED ORDER — PHENYLEPHRINE HCL 10 MG/ML IJ SOLN
INTRAMUSCULAR | Status: DC | PRN
  Administered 2018-08-20: 200 ug via INTRAVENOUS
  Administered 2018-08-20 (×3): 100 ug via INTRAVENOUS

## 2018-08-20 MED ORDER — ACETAMINOPHEN 10 MG/ML IV SOLN
INTRAVENOUS | Status: DC | PRN
  Administered 2018-08-20: 1000 mg via INTRAVENOUS

## 2018-08-20 MED ORDER — FENTANYL CITRATE (PF) 100 MCG/2ML IJ SOLN
INTRAMUSCULAR | Status: AC
  Filled 2018-08-20: qty 2

## 2018-08-20 MED ORDER — FENTANYL CITRATE (PF) 100 MCG/2ML IJ SOLN
25.0000 ug | INTRAMUSCULAR | Status: DC | PRN
  Administered 2018-08-20 (×3): 50 ug via INTRAVENOUS

## 2018-08-20 MED ORDER — FENTANYL CITRATE (PF) 100 MCG/2ML IJ SOLN
INTRAMUSCULAR | Status: AC
  Administered 2018-08-20: 50 ug via INTRAVENOUS
  Filled 2018-08-20: qty 2

## 2018-08-20 MED ORDER — CEFAZOLIN SODIUM 1 G IJ SOLR
INTRAMUSCULAR | Status: AC
  Filled 2018-08-20: qty 20

## 2018-08-20 MED ORDER — SUGAMMADEX SODIUM 200 MG/2ML IV SOLN
INTRAVENOUS | Status: AC
  Filled 2018-08-20: qty 2

## 2018-08-20 MED ORDER — FENTANYL CITRATE (PF) 100 MCG/2ML IJ SOLN
INTRAMUSCULAR | Status: DC | PRN
  Administered 2018-08-20: 25 ug via INTRAVENOUS
  Administered 2018-08-20 (×2): 50 ug via INTRAVENOUS
  Administered 2018-08-20: 25 ug via INTRAVENOUS
  Administered 2018-08-20 (×4): 50 ug via INTRAVENOUS

## 2018-08-20 MED ORDER — ACETAMINOPHEN 10 MG/ML IV SOLN
INTRAVENOUS | Status: AC
  Filled 2018-08-20: qty 100

## 2018-08-20 MED ORDER — PHENYLEPHRINE HCL 10 MG/ML IJ SOLN
INTRAMUSCULAR | Status: AC
  Filled 2018-08-20: qty 1

## 2018-08-20 MED ORDER — SODIUM CHLORIDE (PF) 0.9 % IJ SOLN
INTRAMUSCULAR | Status: AC
  Filled 2018-08-20: qty 10

## 2018-08-20 MED ORDER — FENTANYL CITRATE (PF) 250 MCG/5ML IJ SOLN
INTRAMUSCULAR | Status: AC
  Filled 2018-08-20: qty 5

## 2018-08-20 MED ORDER — DEXMEDETOMIDINE HCL IN NACL 80 MCG/20ML IV SOLN
INTRAVENOUS | Status: AC
  Filled 2018-08-20: qty 20

## 2018-08-20 MED ORDER — ALBUTEROL SULFATE (2.5 MG/3ML) 0.083% IN NEBU
2.5000 mg | INHALATION_SOLUTION | RESPIRATORY_TRACT | Status: DC
  Administered 2018-08-20 – 2018-08-21 (×3): 2.5 mg via RESPIRATORY_TRACT
  Filled 2018-08-20 (×3): qty 3

## 2018-08-20 MED ORDER — SUGAMMADEX SODIUM 500 MG/5ML IV SOLN
INTRAVENOUS | Status: DC | PRN
  Administered 2018-08-20: 200 mg via INTRAVENOUS

## 2018-08-20 MED ORDER — SODIUM CHLORIDE 0.9 % IV SOLN
INTRAVENOUS | Status: DC | PRN
  Administered 2018-08-20: 20 ug/min via INTRAVENOUS

## 2018-08-20 MED ORDER — ROCURONIUM BROMIDE 100 MG/10ML IV SOLN
INTRAVENOUS | Status: DC | PRN
  Administered 2018-08-20: 20 mg via INTRAVENOUS
  Administered 2018-08-20: 10 mg via INTRAVENOUS
  Administered 2018-08-20: 20 mg via INTRAVENOUS
  Administered 2018-08-20: 40 mg via INTRAVENOUS
  Administered 2018-08-20: 10 mg via INTRAVENOUS

## 2018-08-20 MED ORDER — MIDAZOLAM HCL 2 MG/2ML IJ SOLN
INTRAMUSCULAR | Status: AC
  Filled 2018-08-20: qty 2

## 2018-08-20 MED ORDER — SODIUM CHLORIDE 0.9 % IV SOLN
INTRAVENOUS | Status: DC | PRN
  Administered 2018-08-20: 70 mL

## 2018-08-20 MED ORDER — CEFAZOLIN SODIUM-DEXTROSE 2-4 GM/100ML-% IV SOLN
2.0000 g | Freq: Three times a day (TID) | INTRAVENOUS | Status: AC
  Administered 2018-08-20 – 2018-08-21 (×2): 2 g via INTRAVENOUS
  Filled 2018-08-20 (×3): qty 100

## 2018-08-20 MED ORDER — LIDOCAINE HCL (CARDIAC) PF 100 MG/5ML IV SOSY
PREFILLED_SYRINGE | INTRAVENOUS | Status: DC | PRN
  Administered 2018-08-20: 60 mg via INTRAVENOUS

## 2018-08-20 MED ORDER — PROPOFOL 10 MG/ML IV BOLUS
INTRAVENOUS | Status: AC
  Filled 2018-08-20: qty 20

## 2018-08-20 MED ORDER — SUCCINYLCHOLINE CHLORIDE 20 MG/ML IJ SOLN
INTRAMUSCULAR | Status: AC
  Filled 2018-08-20: qty 1

## 2018-08-20 MED ORDER — PROMETHAZINE HCL 25 MG/ML IJ SOLN: 6.2500 mg | INTRAMUSCULAR | Status: DC | PRN

## 2018-08-20 SURGICAL SUPPLY — 72 items
BENZOIN TINCTURE PRP APPL 2/3 (GAUZE/BANDAGES/DRESSINGS) ×3 IMPLANT
BNDG COHESIVE 4X5 TAN STRL (GAUZE/BANDAGES/DRESSINGS) IMPLANT
BRONCHOSCOPE PED SLIM DISP (MISCELLANEOUS) ×3 IMPLANT
CANISTER SUCT 1200ML W/VALVE (MISCELLANEOUS) ×3 IMPLANT
CATH  RT ANGL  28F  SOF (CATHETERS) ×1
CATH RT ANGL 28F SOF (CATHETERS) IMPLANT
CATH THOR STR 28F  SOFT WA (CATHETERS) ×1
CATH THOR STR 28F SOFT WA (CATHETERS) IMPLANT
CATH URET ROBINSON 16FR STRL (CATHETERS) ×3 IMPLANT
CHLORAPREP W/TINT 26ML (MISCELLANEOUS) ×6 IMPLANT
CNTNR SPEC 2.5X3XGRAD LEK (MISCELLANEOUS) ×2
CONN REDUCER 3/8X3/8X3/8Y (CONNECTOR) ×3
CONNECTOR REDUCER 3/8X3/8X3/8Y (CONNECTOR) IMPLANT
CONT SPEC 4OZ STER OR WHT (MISCELLANEOUS) ×1
CONTAINER SPEC 2.5X3XGRAD LEK (MISCELLANEOUS) ×8 IMPLANT
CUTTER ECHEON FLEX ENDO 45 340 (ENDOMECHANICALS) ×1 IMPLANT
DEFOGGER SCOPE WARMER CLEARIFY (MISCELLANEOUS) ×1 IMPLANT
DRAIN CHEST DRY SUCT SGL (MISCELLANEOUS) ×3 IMPLANT
DRAPE C-SECTION (MISCELLANEOUS) ×3 IMPLANT
DRAPE MAG INST 16X20 L/F (DRAPES) ×3 IMPLANT
DRSG OPSITE POSTOP 4X6 (GAUZE/BANDAGES/DRESSINGS) ×3 IMPLANT
DRSG OPSITE POSTOP 4X8 (GAUZE/BANDAGES/DRESSINGS) ×3 IMPLANT
DRSG TELFA 3X8 NADH (GAUZE/BANDAGES/DRESSINGS) ×3 IMPLANT
ELECT BLADE 6.5 EXT (BLADE) ×3 IMPLANT
ELECT CAUTERY BLADE TIP 2.5 (TIP) ×3
ELECT REM PT RETURN 9FT ADLT (ELECTROSURGICAL) ×3
ELECTRODE CAUTERY BLDE TIP 2.5 (TIP) ×2 IMPLANT
ELECTRODE REM PT RTRN 9FT ADLT (ELECTROSURGICAL) ×2 IMPLANT
GAUZE SPONGE 4X4 12PLY STRL (GAUZE/BANDAGES/DRESSINGS) ×3 IMPLANT
GLOVE SURG SYN 7.5  E (GLOVE) ×2
GLOVE SURG SYN 7.5 E (GLOVE) ×4 IMPLANT
GLOVE SURG SYN 7.5 PF PI (GLOVE) ×4 IMPLANT
GOWN STRL REUS W/ TWL LRG LVL3 (GOWN DISPOSABLE) ×6 IMPLANT
GOWN STRL REUS W/TWL LRG LVL3 (GOWN DISPOSABLE) ×3
KIT TURNOVER KIT A (KITS) ×3 IMPLANT
LABEL OR SOLS (LABEL) ×3 IMPLANT
LOOP RED MAXI  1X406MM (MISCELLANEOUS) ×1
LOOP VESSEL MAXI 1X406 RED (MISCELLANEOUS) ×2 IMPLANT
MARKER SKIN DUAL TIP RULER LAB (MISCELLANEOUS) ×3 IMPLANT
NDL SPNL 22GX3.5 QUINCKE BK (NEEDLE) ×2 IMPLANT
NEEDLE SPNL 22GX3.5 QUINCKE BK (NEEDLE) ×3 IMPLANT
PACK BASIN MAJOR ARMC (MISCELLANEOUS) ×3 IMPLANT
PAD DRESSING TELFA 3X8 NADH (GAUZE/BANDAGES/DRESSINGS) ×2 IMPLANT
RELOAD STAPLE 35X2.5 WHT THIN (STAPLE) ×8 IMPLANT
RELOAD STAPLE 45 GOLD REG/THCK (STAPLE) IMPLANT
RELOAD STAPLER LINE PROX 30 GR (STAPLE) ×2 IMPLANT
SEALANT PROGEL (MISCELLANEOUS) ×1 IMPLANT
SPONGE KITTNER 5P (MISCELLANEOUS) ×6 IMPLANT
STAPLE RELOAD 2.5MM WHITE (STAPLE) ×15 IMPLANT
STAPLE RELOAD 45MM GOLD (STAPLE) ×12 IMPLANT
STAPLER RELOAD LINE PROX 30 GR (STAPLE) ×3
STAPLER RELOADABLE 30 GRN THCK (STAPLE) IMPLANT
STAPLER SKIN PROX 35W (STAPLE) ×3 IMPLANT
STAPLER VASCULAR ECHELON 35 (CUTTER) ×1 IMPLANT
STRIP CLOSURE SKIN 1/2X4 (GAUZE/BANDAGES/DRESSINGS) ×3 IMPLANT
SUT MNCRL AB 3-0 PS2 27 (SUTURE) IMPLANT
SUT SILK 0 (SUTURE) ×1
SUT SILK 0 30XBRD TIE 6 (SUTURE) ×2 IMPLANT
SUT SILK 1 SH (SUTURE) ×21 IMPLANT
SUT VIC AB 0 CT1 36 (SUTURE) ×6 IMPLANT
SUT VIC AB 2-0 CT1 27 (SUTURE) ×2
SUT VIC AB 2-0 CT1 TAPERPNT 27 (SUTURE) ×4 IMPLANT
SUT VICRYL 2 TP 1 (SUTURE) ×9 IMPLANT
SYR 10ML SLIP (SYRINGE) ×3 IMPLANT
SYR 30ML LL (SYRINGE) ×2 IMPLANT
SYR BULB IRRIG 60ML STRL (SYRINGE) ×3 IMPLANT
TAPE CLOTH 3X10 WHT NS LF (GAUZE/BANDAGES/DRESSINGS) ×3 IMPLANT
TAPE TRANSPORE STRL 2 31045 (GAUZE/BANDAGES/DRESSINGS) IMPLANT
TRAY FOLEY MTR SLVR 16FR STAT (SET/KITS/TRAYS/PACK) ×3 IMPLANT
TUBING CONNECTING 10 (TUBING) ×3 IMPLANT
WATER STERILE IRR 1000ML POUR (IV SOLUTION) ×3 IMPLANT
YANKAUER SUCT BULB TIP FLEX NO (MISCELLANEOUS) ×4 IMPLANT

## 2018-08-20 NOTE — Anesthesia Preprocedure Evaluation (Signed)
Anesthesia Evaluation  Patient identified by MRN, date of birth, ID band Patient awake    Reviewed: Allergy & Precautions, H&P , NPO status , Patient's Chart, lab work & pertinent test results, reviewed documented beta blocker date and time   History of Anesthesia Complications Negative for: history of anesthetic complications  Airway Mallampati: III  TM Distance: >3 FB Neck ROM: full    Dental  (+) Dental Advidsory Given, Missing, Poor Dentition   Pulmonary neg shortness of breath, COPD, neg recent URI, Current Smoker,           Cardiovascular Exercise Tolerance: Good hypertension, (-) angina(-) CAD, (-) Past MI, (-) Cardiac Stents and (-) CABG (-) dysrhythmias (-) Valvular Problems/Murmurs     Neuro/Psych PSYCHIATRIC DISORDERS negative neurological ROS     GI/Hepatic negative GI ROS, Neg liver ROS,   Endo/Other  negative endocrine ROS  Renal/GU Renal disease (kidney stones)  negative genitourinary   Musculoskeletal   Abdominal   Peds  Hematology negative hematology ROS (+)   Anesthesia Other Findings Past Medical History: No date: Alcohol abuse     Comment:  usually drinks 2-3 drinks per day 06/2018: Atherosclerosis 06/2018: Cancer [facility])     Comment:  evaluating for lung cancer 06/2018: Emphysema of lung ([place])     Comment:  patient unaware of this. 05/2018: History of kidney stones     Comment:  per xray, bilateral nephrolitiasis No date: Hypertension   Reproductive/Obstetrics negative OB ROS                             Anesthesia Physical  Anesthesia Plan  ASA: III  Anesthesia Plan: General   Post-op Pain Management:    Induction: Intravenous  PONV Risk Score and Plan: 1 and Ondansetron, Dexamethasone, Midazolam, Promethazine and Treatment may vary due to age or medical condition  Airway Management Planned: Double Lumen EBT  Additional Equipment:   Intra-op Plan:    Post-operative Plan: Extubation in OR  Informed Consent: I have reviewed the patients History and Physical, chart, labs and discussed the procedure including the risks, benefits and alternatives for the proposed anesthesia with the patient or authorized representative who has indicated his/her understanding and acceptance.     Dental Advisory Given  Plan Discussed with: Anesthesiologist, CRNA and Surgeon  Anesthesia Plan Comments:         Anesthesia Quick Evaluation

## 2018-08-20 NOTE — Progress Notes (Signed)
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  Transferred to SD for monitoring post op    Review of Systems:  Gen:  Denies  fever, sweats, chills weigh loss  HEENT: Denies blurred vision, double vision, ear pain, eye pain, hearing loss, nose bleeds, sore throat Cardiac:  No dizziness, chest pain or heaviness, chest tightness,edema, No JVD Resp:   No cough, -sputum production, -shortness of breath,-wheezing, -hemoptysis,  Gi: Denies swallowing difficulty, stomach pain, nausea or vomiting, diarrhea, constipation, bowel incontinence Gu:  Denies bladder incontinence, burning urine Ext:   Denies Joint pain, stiffness or swelling Skin: Denies  skin rash, easy bruising or bleeding or hives Endoc:  Denies polyuria, polydipsia , polyphagia or weight change Psych:   Denies depression, insomnia or hallucinations  Other:  All other systems negative      Objective   Examination:  General exam: Appears calm and comfortable  Respiratory system: Clear to auscultation. Respiratory effort normal. HEENT: [place]/AT, PERRLA, no thrush, no stridor. Cardiovascular system: S1 & S2 heard, RRR. No JVD, murmurs, rubs, gallops or clicks. No pedal edema. Gastrointestinal system: Abdomen is nondistended, soft and nontender. No organomegaly or masses felt. Normal bowel sounds heard. Central nervous system: Alert and oriented. No focal neurological deficits. Extremities: Symmetric 5 x 5 power. Skin: No rashes, lesions or ulcers Psychiatry: Judgement and insight appear normal. Mood & affect appropriate.   VITALS:  height is 6\' 1"  (1.854 m) and weight is 67.3 kg. His temperature is 97.6 F (36.4 C). His blood pressure is 169/96 (abnormal) and his pulse is 98. His respiration is 21 (abnormal) and oxygen saturation is 97%.   I personally reviewed Labs under Results section.  Radiology Reports Dg Chest Port 1 View  Result Date: 08/20/2018 CLINICAL  DATA:  S/p THORACOTOMY MAJOR RIGHT UPPER LOBE LOBECTOMY. EXAM: PORTABLE CHEST 1 VIEW COMPARISON:  08/19/2018 FINDINGS: Two RIGHT-sided chest tubes. Inferior tube appears unchanged. The tip of the superior chest tube overlies the RIGHT lung apex and has been advanced since the prior study. There is increased RIGHT pneumothorax, pleural separation measuring 5.2 centimeters at the apex. Increased soft tissue gas. Stable appearance of subsegmental atelectasis in the LEFT LOWER lobe. IMPRESSION: Increased RIGHT pneumothorax. RIGHT chest tube appears to have been advanced into the region of the apex. Increased RIGHT chest wall subcutaneous gas. These results will be called to the ordering clinician or representative by the Radiologist Assistant, and communication documented in the PACS or zVision Dashboard. Electronically Signed   By: Nolon Nations M.D.   On: 08/20/2018 12:41   Dg Chest Port 1 View  Result Date: 08/19/2018 CLINICAL DATA:  Postop EXAM: PORTABLE CHEST 1 VIEW COMPARISON:  08/14/2018, 08/13/2018, 08/12/2018 FINDINGS: Right-sided chest tubes remain in place with lateral chest tube tip at the apex and lower chest drainage catheter tip projecting over the right upper quadrant. Right lower chest drainage catheter slightly retracted with the side-port more superior in location. Postsurgical changes in the right upper lung. Decreased opacity adjacent to the chain sutures. Possible trace right cardio phrenic sulcus pneumothorax. Similar appearance of atelectasis and elevation of left diaphragm. Normal heart size. IMPRESSION: 1. Right-sided chest tubes remain in place. The right more inferior chest drainage catheter is slightly retracted with the side port and tip slightly more superior in position as compared with prior radiograph. 2. There is questionable trace pneumothorax at the right cardio phrenic sulcus. 3. Postsurgical changes in the right upper  lung with decreased opacity near the sutures. 4.  Persistent elevation of left diaphragm with probable atelectasis. Electronically Signed   By: Donavan Foil M.D.   On: 08/19/2018 14:48       Assessment/Plan:  Post op thoracotomy  Pain control  Incentive spirometry BD therapy Follow up CT surgery recs    Corrin Parker, M.D.  [facility]  Medical Director [place] Director [facility] Cardio-Pulmonary Department

## 2018-08-20 NOTE — Anesthesia Procedure Notes (Signed)
Procedure Name: Intubation Date/Time: 08/20/2018 7:44 AM Performed by: Kelton Pillar, CRNA Pre-anesthesia Checklist: Patient identified, Emergency Drugs available, Suction available and Patient being monitored Patient Re-evaluated:Patient Re-evaluated prior to induction Oxygen Delivery Method: Circle system utilized Preoxygenation: Pre-oxygenation with 100% oxygen Induction Type: IV induction Ventilation: Mask ventilation without difficulty Laryngoscope Size: McGraph and 3 Grade View: Grade I Tube type: Oral Endobronchial tube: Left and 39 Fr Number of attempts: 2 (SRNA DLx1 Miller 2, grade 1view unable to pass DLT, SRNA DLx1 McGraph successful placement ) Airway Equipment and Method: Stylet Placement Confirmation: ETT inserted through vocal cords under direct vision,  positive ETCO2,  CO2 detector and breath sounds checked- equal and bilateral Tube secured with: Tape Dental Injury: Teeth and Oropharynx as per pre-operative assessment  Comments: SRNA DLx1 Miller 2, unable to pass DLT, SRNA DLx1 McGraph #3, successful placement, atraumatic TFH

## 2018-08-20 NOTE — Progress Notes (Signed)
Patient transported to [place] RN ,chest tube intact. Wife at bedside. Samuel Choi

## 2018-08-20 NOTE — Anesthesia Post-op Follow-up Note (Signed)
Anesthesia QCDR form completed.        

## 2018-08-20 NOTE — Transfer of Care (Signed)
Immediate Anesthesia Transfer of Care Note  Patient: Samuel Choi  Procedure(s) Performed: THORACOTOMY MAJOR RIGHT UPPER LOBE LOBECTOMY (Right ) FLEXIBLE BRONCHOSCOPY PREOP (N/A )  Patient Location: PACU  Anesthesia Type:General  Level of Consciousness: awake, alert , oriented, drowsy and patient cooperative  Airway & Oxygen Therapy: Patient Spontanous Breathing and Patient connected to nasal cannula oxygen  Post-op Assessment: Report given to RN and Post -op Vital signs reviewed and stable  Post vital signs: Reviewed and stable  Last Vitals:  Vitals Value Taken Time  BP 190/92 08/20/2018 11:38 AM  Temp 36.6 C 08/20/2018 11:38 AM  Pulse 108 08/20/2018 11:38 AM  Resp 20 08/20/2018 11:38 AM  SpO2 100 % 08/20/2018 11:38 AM    Last Pain:  Vitals:   08/20/18 0514  TempSrc: Oral  PainSc:       Patients Stated Pain Goal: 0 (04/59/97 7414)  Complications: No apparent anesthesia complications

## 2018-08-20 NOTE — Op Note (Signed)
08/20/2018  12:00 PM  PATIENT:  Samuel Choi  53 y.o. 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  However when the permanent sections came back there was definitely evidence of a malignant tumor in the right upper lobe and after an extensive discussion with the patient and his family as well as with the oncologist and the pathologist it was recommended that he undergo redo thoracotomy with right upper lobectomy and mediastinal lymphadenectomy.  The indications and risks of the procedure were explained the patient gives informed consent.  DICTATION: The patient was brought to the operating suite placed in the supine position.  General endotracheal anesthesia was given through a double-lumen tube.  Preoperative bronchoscopy was carried out to ensure that the tube was in the proper position and to reevaluate the bronchial anatomy.  There is no evidence of endobronchial tumor, bleeding or infection.  The tube was positioned in the left mainstem bronchus and then the patient was turned for a right thoracotomy.  All pressure points were carefully padded.  The 2 previous chest tubes were removed.  The patient was then prepped and draped in usual sterile fashion.  The previous thoracotomy wound was opened.  The staples were removed from the skin and all of the sutures holding the subcutaneous tissues muscle and ribs were removed.   The chest was again entered.  There were only minimal adhesions from the lung to the chest wall and these were taken down bluntly.  We again examined the inferior pulmonary ligament and freed this up to the level of the inferior pulmonary vein.  We identified several small lymph nodes in the subcarinal space and these were taken down and sent for permanent section.  We then began by opening the fissure between the upper and middle lobes.  It was quite indistinct and extensive dissection was required to identify the branches of the upper lobe arteries and veins.  However once we were able to dissect the veins over to the hilum we then completed the fissure between the upper middle lobes with a gold load stapler.  The upper lobe vein was then taken with multiple firings of the vascular stapler.  We were sure to keep the middle lobe vein out of harm's way.  There were 2 large branches to the upper lobe and apical anterior branch and the posterior ascending branch.  Both of these were secured with a vascular stapler.  The only remaining structure at this point was the bronchus.  Several lymph nodes were swept up into the lung and the bronchus was then secured with a TX 30 stapler.  The middle lower lobe is ventilated nicely and then the stapler was fired.  The bronchial stump was checked at 30 cm of water pressure and there is no evidence of an air leak.  Our dissection along the middle lobe upper lobe fissure was then covered with pro-gel.  Hemostasis was complete and 2 chest tubes were inserted.  We then opened the pretracheal space and there were several lymph nodes taken from that area.  There were no additional lymph nodes to remove from this patient's mediastinum and we then closed the chest.  Hemostasis was complete before we closed in 2 chest tubes were inserted in standard fashion and they were positioned in the paravertebral space posteriorly and anteriorly to the apex.  These were secured to the skin with  silk sutures.  The chest was then closed in standard fashion using #2 Vicryl pericostal sutures.  The muscles of the chest wall were closed with #2 Vicryl subcutaneous tissues with 2-0 Vicryl and the skin with skin clips.  Our access port inferiorly was closed with 0 Vicryl and skin clips.  Sterile dressings were then applied.  Exparel was also used for postoperative pain management.  20 cc of Exparel was mixed with 30 cc of half percent Marcaine without epinephrine and 50 cc of normal saline.  This was injected along the para vertebral and intercostal spaces.  All sponge needle and instrument counts were correct as reported to me at the end the case.  The patient was then rolled in the supine position where he was extubated and taken to the recovery room in stable condition.    [name], MD

## 2018-08-20 NOTE — Progress Notes (Signed)
   08/20/18 1400  Clinical Encounter Type  Visited With Patient and family together  Visit Type Initial;Spiritual support  Referral From Chaplain  Consult/Referral To Chaplain  Chaplain visited the waiting area for ICU and found patient's wife concerned about husband (patient) Patient had surgery this morning and was in recovery about 12 noon and wife had not heard anything. Chaplain told wife let her find out some information for her and return to get her. The care team was trying to call patient's wife when Chaplain walked up. Chaplain went to get patient's wife and escorted her to patient's room. Chaplain offered to continue to pray for patient and wife thanked Clinical biochemist.

## 2018-08-20 NOTE — Interval H&P Note (Signed)
History and Physical Interval Note:  08/20/2018 7:19 AM  Samuel Choi  has presented today for surgery, with the diagnosis of Lung mass  The various methods of treatment have been discussed with the patient and family. After consideration of risks, benefits and other options for treatment, the patient has consented to  Procedure(s): THORACOTOMY MAJOR RIGHT UPPER LOBE LOBECTOMY (Right) FLEXIBLE BRONCHOSCOPY PREOP (N/A) as a surgical intervention .  The patient's history has been reviewed, patient examined, no change in status, stable for surgery.  I have reviewed the patient's chart and labs.  Questions were answered to the patient's satisfaction.     [name]

## 2018-08-21 ENCOUNTER — Inpatient Hospital Stay: Payer: BLUE CROSS/BLUE SHIELD

## 2018-08-21 LAB — TYPE AND SCREEN
ABO/RH(D): A POS
Antibody Screen: NEGATIVE
UNIT DIVISION: 0
Unit division: 0

## 2018-08-21 LAB — BPAM RBC
Blood Product Expiration Date: 202003032359
Blood Product Expiration Date: 202003042359
Unit Type and Rh: 6200
Unit Type and Rh: 6200

## 2018-08-21 LAB — PREPARE RBC (CROSSMATCH)

## 2018-08-21 MED ORDER — ALBUTEROL SULFATE (2.5 MG/3ML) 0.083% IN NEBU
2.5000 mg | INHALATION_SOLUTION | Freq: Two times a day (BID) | RESPIRATORY_TRACT | Status: DC
  Administered 2018-08-21 – 2018-08-23 (×5): 2.5 mg via RESPIRATORY_TRACT
  Filled 2018-08-21 (×5): qty 3

## 2018-08-21 MED ORDER — HYDROCHLOROTHIAZIDE 12.5 MG PO CAPS
12.5000 mg | ORAL_CAPSULE | Freq: Every day | ORAL | Status: DC
  Administered 2018-08-21 – 2018-08-24 (×4): 12.5 mg via ORAL
  Filled 2018-08-21 (×4): qty 1

## 2018-08-21 MED ORDER — HYDRALAZINE HCL 50 MG PO TABS: 100.0000 mg | ORAL_TABLET | Freq: Two times a day (BID) | ORAL | Status: DC

## 2018-08-21 NOTE — Anesthesia Postprocedure Evaluation (Signed)
Anesthesia Post Note  Patient: [name]  Procedure(s) Performed: THORACOTOMY MAJOR RIGHT UPPER LOBE LOBECTOMY (Right ) FLEXIBLE BRONCHOSCOPY PREOP (N/A )  Patient location during evaluation: ICU Anesthesia Type: General Level of consciousness: awake and alert Pain management: pain level controlled Vital Signs Assessment: post-procedure vital signs reviewed and stable Respiratory status: spontaneous breathing, nonlabored ventilation, respiratory function stable and patient connected to nasal cannula oxygen Cardiovascular status: blood pressure returned to baseline and stable Postop Assessment: no apparent nausea or vomiting Anesthetic complications: no     Last Vitals:  Vitals:   08/21/18 0500 08/21/18 0600  BP: (!) 175/87 (!) 172/87  Pulse: 98 97  Resp: 18 (!) 22  Temp:    SpO2: 92% 90%    Last Pain:  Vitals:   08/21/18 0509  TempSrc:   PainSc: 7                  Alison Stalling

## 2018-08-22 ENCOUNTER — Other Ambulatory Visit: Payer: BLUE CROSS/BLUE SHIELD

## 2018-08-22 ENCOUNTER — Inpatient Hospital Stay: Payer: BLUE CROSS/BLUE SHIELD

## 2018-08-22 LAB — SURGICAL PATHOLOGY

## 2018-08-22 MED ORDER — ENSURE ENLIVE PO LIQD
237.0000 mL | Freq: Three times a day (TID) | ORAL | Status: DC
  Administered 2018-08-22 – 2018-08-24 (×5): 237 mL via ORAL

## 2018-08-22 MED ORDER — ADULT MULTIVITAMIN W/MINERALS CH
1.0000 | ORAL_TABLET | Freq: Every day | ORAL | Status: DC
  Administered 2018-08-22 – 2018-08-23 (×2): 1 via ORAL
  Filled 2018-08-22 (×2): qty 1

## 2018-08-22 NOTE — Progress Notes (Signed)
Tumor Board Documentation  JASMEET GEHL was presented by Dr Tasia Catchings at our Tumor Board on 08/22/2018, which included representatives from medical oncology, radiation oncology, surgical oncology, pathology, navigation, internal medicine, surgical, radiology, research.  Yuval currently presents as a current patient, for new positive pathology, for [place], for discussion with history of the following treatments: surgical intervention(s), active survellience.  Additionally, we reviewed previous medical and familial history, history of present illness, and recent lab results along with all available histopathologic and imaging studies. The tumor board considered available treatment options and made the following recommendations:   await further pathology results and will give chemotherapy if pathology is positive in mediastinum  The following procedures/referrals were also placed: No orders of the defined types were placed in this encounter.   Clinical Trial Status: not discussed   Staging used: AJCC Stage Group  AJCC Staging: T: pT1 N: pNx   Group: Squamous Cell Lung Cancer   National site-specific guidelines NCCN were discussed with respect to the case.  Tumor board is a meeting of clinicians from various specialty areas who evaluate and discuss patients for whom a multidisciplinary approach is being considered. Final determinations in the plan of care are those of the provider(s). The responsibility for follow up of recommendations given during tumor board is that of the provider.   Today's extended care, comprehensive team conference, [place] was not present for the discussion and was not examined.   Multidisciplinary Tumor Board is a multidisciplinary case peer review process.  Decisions discussed in the Multidisciplinary Tumor Board reflect the opinions of the specialists present at the conference without having examined the patient.  Ultimately, treatment and diagnostic decisions rest with the  primary provider(s) and the patient.

## 2018-08-22 NOTE — Progress Notes (Signed)
  Patient ID: Samuel Choi, male   DOB: 07-09-1966, 53 y.o.   MRN: [id number] Overall he had a pretty uneventful night.  He is not short of breath.  Does not complain of any significant discomfort that is not controllable with his current pain regimen.  His blood pressure does appear to be slightly improved with the introduction of his hydrochlorothiazide yesterday.  He states he did not get out of bed yesterday and I encouraged him to do so today.  We will re-ask physical therapy to come back and see the patient.  He has coarse rhonchi on the right and clear on the left.  His heart is regular.  There is no air leak from the system.  His thoracotomy wound is healing as expected.  We will place his chest tubes to waterseal.  We will repeat his chest x-ray later today.  The final pathology is still pending.

## 2018-08-22 NOTE — Progress Notes (Signed)
Initial Nutrition Assessment  DOCUMENTATION CODES:   Non-severe (moderate) malnutrition in context of social or environmental circumstances  INTERVENTION:   Ensure Enlive po TID, each supplement provides 350 kcal and 20 grams of protein  MVI daily  NUTRITION DIAGNOSIS:   Moderate Malnutrition related to social / environmental circumstances as evidenced by moderate to severe fat depletion, moderate to severe muscle depletion.  GOAL:   Patient will meet greater than or equal to 90% of their needs  MONITOR:   PO intake, Supplement acceptance, Labs, Weight trends, Skin, I & O's  REASON FOR ASSESSMENT:   LOS    ASSESSMENT:   53 year old gentleman s/p right thoracotomy 2/4 for a right upper lobe mass in which permanent sections returned with evidence of a malignant tumor in the right upper lobe. Pt now s/p redo thoracotomy with right upper lobectomy and mediastinal lymphadenectomy 2/11   Met with pt in room today. Pt reports fair appetite and oral intake at baseline. Pt currently eating 30-100% of meals in hospital. Pt is willing to try strawberry or vanilla Ensure while in hospital. RD discussed the importance of adequate protein intake needed for healing and to preserve lean muscle. Pt with moderate to severe muscle and fat depletions; per chart, pt is weight stable since 2016. Unsure as to why pt with depletions; there is one note from chart in which etoh abuse may be a factor. Pt is at high risk for developing severe malnutrition. RD will add supplements and MVI to support healing. Chest tube in place with 424m output.   Medications reviewed and include: dulcolax, tramadol  Labs reviewed: Na 131(L), Cl 95(L) Wbc- 12.4(H)  NUTRITION - FOCUSED PHYSICAL EXAM:    Most Recent Value  Orbital Region  Mild depletion  Upper Arm Region  Severe depletion  Thoracic and Lumbar Region  Moderate depletion  Buccal Region  Mild depletion  Temple Region  Moderate depletion  Clavicle Bone  Region  Moderate depletion  Clavicle and Acromion Bone Region  Moderate depletion  Scapular Bone Region  Moderate depletion  Dorsal Hand  Moderate depletion  Patellar Region  Severe depletion  Anterior Thigh Region  Severe depletion  Posterior Calf Region  Severe depletion  Edema (RD Assessment)  None  Hair  Reviewed  Eyes  Reviewed  Mouth  Reviewed  Skin  Reviewed  Nails  Reviewed     Diet Order:   Diet Order            Diet regular Room service appropriate? Yes; Fluid consistency: Thin  Diet effective now             EDUCATION NEEDS:   Education needs have been addressed  Skin:  Skin Assessment: Reviewed RN Assessment(incision R chest )  Last BM:  2/10  Height:   Ht Readings from Last 1 Encounters:  08/13/18 6' 1" (1.854 m)    Weight:   Wt Readings from Last 1 Encounters:  08/22/18 68.9 kg    Ideal Body Weight:  83.6 kg  BMI:  Body mass index is 20.03 kg/m.  Estimated Nutritional Needs:   Kcal:  2100-2400kcal/day   Protein:  103-117g/day   Fluid:  >2L/day   CKoleen DistanceMS, RD, LDN Pager #- [phone]#- [phone] Hours Pager: [phone]

## 2018-08-22 NOTE — Evaluation (Signed)
Physical Therapy Re-Evaluation Patient Details Name: Samuel Choi MRN: [id number] DOB: 11/04/1965 Today's Date: 08/22/2018   History of Present Illness  Pt is a 53 yo male s/p right thoracotomy with RUL wedge resection 08/13/18.  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  PMH includes: COPD, HTN, alcohol abuse, and atherosclerosis.     Clinical Impression  Pt presents with deficits in strength, transfers, mobility, gait, and activity tolerance.  Pt required extra time and effort with bed mobility tasks and transfers but did not require physical assistance.  Pt was only able to amb 30 feet this session compared to over 100' after initial surgery limited by fatigue.  [name] SpO2 dropped to 86% from 90% after amb on room air with SpO2 returning to 90% after sitting 45-60 sec, nursing notified and requested pt to remain on room air.  Pt notably weaker functionally now compared to after initial surgery but remains appropriate for HHPT upon discharge from acute care to address the above deficits for return to PLOF.   Previous goals remain appropriate.       Follow Up Recommendations Home health PT    Equipment Recommendations  None recommended by PT    Recommendations for Other Services       Precautions / Restrictions Precautions Precautions: Fall;Other (comment) Precaution Comments: R side chest tube Restrictions Weight Bearing Restrictions: No Other Position/Activity Restrictions: HOB to 45 deg; Chest tubes may be placed to water seal for ambulation      Mobility  Bed Mobility Overal bed mobility: Modified Independent             General bed mobility comments: Extra time and effort but no physical assistance required during session  Transfers Overall transfer level: Needs assistance Equipment used: Rolling walker (2 wheeled) Transfers: Sit to/from Stand Sit to  Stand: Min guard         General transfer comment: Extra time and effort required during sit to/from stand transfers but no physical assistance needed  Ambulation/Gait Ambulation/Gait assistance: Min guard Gait Distance (Feet): 30 Feet Assistive device: Rolling walker (2 wheeled) Gait Pattern/deviations: Step-through pattern;Decreased step length - right;Decreased step length - left Gait velocity: Decreased   General Gait Details: Decreased cadence and B step length but steady without LOB; SpO2 86% after amb increasing back to 90% after sitting 45-60 sec  Stairs            Wheelchair Mobility    Modified Rankin (Stroke Patients Only)       Balance Overall balance assessment: No apparent balance deficits (not formally assessed)                                           Pertinent Vitals/Pain Pain Assessment: 0-10 Pain Score: 7  Pain Location: Chest tube insertion site Pain Descriptors / Indicators: Aching;Sore Pain Intervention(s): Premedicated before session;Monitored during session    [place] expects to be discharged to:: Private residence Living Arrangements: Spouse/significant other Available Help at Discharge: Family;Available PRN/intermittently Type of Home: House Home Access: Stairs to enter Entrance Stairs-Rails: None Entrance Stairs-Number of Steps: 3 Home Layout: Two level;Able to live on main level with bedroom/bathroom Home Equipment: Walker - 2 wheels      Prior Function Level of Independence: Independent  Comments: Pt ind with amb community distances without AD, one fall in the last year, Ind with ADLs     Hand Dominance        Extremity/Trunk Assessment   Upper Extremity Assessment Upper Extremity Assessment: Overall WFL for tasks assessed    Lower Extremity Assessment Lower Extremity Assessment: Generalized weakness       Communication   Communication: No difficulties  Cognition  Arousal/Alertness: Awake/alert Behavior During Therapy: WFL for tasks assessed/performed Overall Cognitive Status: Within Functional Limits for tasks assessed                                        General Comments      Exercises Total Joint Exercises Ankle Circles/Pumps: AROM;Both;10 reps Quad Sets: Strengthening;Both;10 reps Gluteal Sets: Strengthening;Both;10 reps Short Arc Quad: Strengthening;Both;10 reps Hip ABduction/ADduction: AROM;Both;10 reps Straight Leg Raises: AROM;Both;10 reps Long Arc Quad: AROM;Both;10 reps Knee Flexion: AROM;Both;10 reps Other Exercises Other Exercises: HEP education for BLE APs, QS, and GS x 10 each every 1-2 hrs daily   Assessment/Plan    PT Assessment Patient needs continued PT services  PT Problem List Decreased strength;Decreased activity tolerance;Decreased knowledge of use of DME       PT Treatment Interventions DME instruction;Gait training;Stair training;Functional mobility training;Therapeutic activities;Therapeutic exercise;Balance training;Patient/family education    PT Goals (Current goals can be found in the Care Plan section)  Acute Rehab PT Goals Patient Stated Goal: To get back home PT Goal Formulation: With patient Time For Goal Achievement: 09/04/18 Potential to Achieve Goals: Good    Frequency Min 2X/week   Barriers to discharge        Co-evaluation               AM-PAC PT "6 Clicks" Mobility  Outcome Measure Help needed turning from your back to your side while in a flat bed without using bedrails?: None Help needed moving from lying on your back to sitting on the side of a flat bed without using bedrails?: None Help needed moving to and from a bed to a chair (including a wheelchair)?: A Little Help needed standing up from a chair using your arms (e.g., wheelchair or bedside chair)?: A Little Help needed to walk in hospital room?: A Little Help needed climbing 3-5 steps with a railing? : A  Lot 6 Click Score: 19    End of Session Equipment Utilized During Treatment: Gait belt Activity Tolerance: Patient limited by fatigue Patient left: in bed;with bed alarm set;with call bell/phone within reach;Other (comment)(Pt declined up in chair, bed to 45 deg) Nurse Communication: Mobility status;Other (comment)(Nursing put pt on water seal for amb and called at end of session to return to suction) PT Visit Diagnosis: Muscle weakness (generalized) (M62.81);Difficulty in walking, not elsewhere classified (R26.2)    Time: 0272-5366 PT Time Calculation (min) (ACUTE ONLY): 23 min   Charges:   PT Evaluation $PT Re-evaluation: 1 Re-eval PT Treatments $Therapeutic Exercise: 8-22 mins        D. Scott Khoi Hamberger PT, DPT 08/22/18, 1:16 PM

## 2018-08-23 DIAGNOSIS — E440 Moderate protein-calorie malnutrition: Secondary | ICD-10-CM

## 2018-08-23 LAB — CBC
HCT: 38.3 % — ABNORMAL LOW (ref 39.0–52.0)
Hemoglobin: 12.9 g/dL — ABNORMAL LOW (ref 13.0–17.0)
MCH: 32.9 pg (ref 26.0–34.0)
MCHC: 33.7 g/dL (ref 30.0–36.0)
MCV: 97.7 fL (ref 80.0–100.0)
PLATELETS: 437 10*3/uL — AB (ref 150–400)
RBC: 3.92 MIL/uL — ABNORMAL LOW (ref 4.22–5.81)
RDW: 12.1 % (ref 11.5–15.5)
WBC: 20.1 10*3/uL — ABNORMAL HIGH (ref 4.0–10.5)
nRBC: 0 % (ref 0.0–0.2)

## 2018-08-23 LAB — BASIC METABOLIC PANEL
Anion gap: 10 (ref 5–15)
BUN: 19 mg/dL (ref 6–20)
CO2: 28 mmol/L (ref 22–32)
Calcium: 9.6 mg/dL (ref 8.9–10.3)
Chloride: 89 mmol/L — ABNORMAL LOW (ref 98–111)
Creatinine, Ser: 1.11 mg/dL (ref 0.61–1.24)
GFR calc Af Amer: >60 mL/min (ref >60)
GFR calc non Af Amer: >60 mL/min (ref >60)
Glucose, Bld: 102 mg/dL — ABNORMAL HIGH (ref 70–99)
POTASSIUM: 4.5 mmol/L (ref 3.5–5.1)
Sodium: 127 mmol/L — ABNORMAL LOW (ref 135–145)

## 2018-08-23 NOTE — Plan of Care (Signed)

## 2018-08-23 NOTE — Progress Notes (Signed)
Patient ID: Samuel Choi, male   DOB: December 24, 1965, 53 y.o.   MRN: [id number]   No specific complaints today.  He did get up and ambulate some yesterday.  His pain is under good control.  He is eating well.  His thoracotomy wound is healing as expected.  His lungs are diminished bilaterally but equal.  His heart is regular.  We will repeat some laboratory studies today as well as a chest x-ray.  If the chest x-ray looks okay we will remove the posterior angled tube.  There is no air leak today from the system.  I did review with him the pathology.  All lymph nodes removed were negative.

## 2018-08-24 ENCOUNTER — Inpatient Hospital Stay: Payer: BLUE CROSS/BLUE SHIELD

## 2018-08-24 LAB — COMPREHENSIVE METABOLIC PANEL
ALT: 42 U/L (ref 0–44)
AST: 50 U/L — AB (ref 15–41)
Albumin: 3 g/dL — ABNORMAL LOW (ref 3.5–5.0)
Alkaline Phosphatase: 103 U/L (ref 38–126)
Anion gap: 9 (ref 5–15)
BUN: 20 mg/dL (ref 6–20)
CO2: 28 mmol/L (ref 22–32)
Calcium: 9.3 mg/dL (ref 8.9–10.3)
Chloride: 91 mmol/L — ABNORMAL LOW (ref 98–111)
Creatinine, Ser: 0.88 mg/dL (ref 0.61–1.24)
GFR calc Af Amer: >60 mL/min (ref >60)
GFR calc non Af Amer: >60 mL/min (ref >60)
Glucose, Bld: 158 mg/dL — ABNORMAL HIGH (ref 70–99)
Potassium: 4.2 mmol/L (ref 3.5–5.1)
Sodium: 128 mmol/L — ABNORMAL LOW (ref 135–145)
Total Bilirubin: 0.9 mg/dL (ref 0.3–1.2)
Total Protein: 7.6 g/dL (ref 6.5–8.1)

## 2018-08-24 LAB — CBC
HCT: 36.4 % — ABNORMAL LOW (ref 39.0–52.0)
Hemoglobin: 12.6 g/dL — ABNORMAL LOW (ref 13.0–17.0)
MCH: 33 pg (ref 26.0–34.0)
MCHC: 34.6 g/dL (ref 30.0–36.0)
MCV: 95.3 fL (ref 80.0–100.0)
NRBC: 0 % (ref 0.0–0.2)
Platelets: 473 10*3/uL — ABNORMAL HIGH (ref 150–400)
RBC: 3.82 MIL/uL — ABNORMAL LOW (ref 4.22–5.81)
RDW: 12.3 % (ref 11.5–15.5)
WBC: 14 10*3/uL — AB (ref 4.0–10.5)

## 2018-08-24 MED ORDER — TRAMADOL HCL 50 MG PO TABS: 100.0000 mg | ORAL_TABLET | Freq: Four times a day (QID) | ORAL | 0 refills | Status: DC

## 2018-08-24 MED ORDER — METHOCARBAMOL 500 MG PO TABS: 500.0000 mg | ORAL_TABLET | Freq: Four times a day (QID) | ORAL | 0 refills | Status: DC

## 2018-08-24 MED ORDER — OXYCODONE-ACETAMINOPHEN 5-325 MG PO TABS: 1.0000 | ORAL_TABLET | Freq: Four times a day (QID) | ORAL | 0 refills | Status: DC | PRN

## 2018-08-24 NOTE — Discharge Instructions (Signed)
Please leave dressing in place until Sunday evening.  Remove and wash over all wounds with soap and water.  Will need to call the office on Monday and arrange for blood work on Tuesday and a doctor visit on Friday.

## 2018-08-24 NOTE — Discharge Summary (Signed)
Physician Discharge Summary  Patient ID: Samuel Choi MRN: [id number] DOB/AGE: 53-15-1967 53 y.o.  Admit date: 08/13/2018 Discharge date: 08/24/2018   Discharge Diagnoses:  Active Problems:   Lung mass   Malnutrition of moderate degree   Procedures:  Right thoracotomy and wedge resection.  Redo right thoracotomy and right upper lobectomy`  Hospital Course: This patient was admitted to the hospital and under went a right thoracotomy and wedge resection of a right upper lobe mass.  Frozen section was consistent with an inflammatory process.  On the second postop day, permanent pathology showed a malignant process and after several days, the process was called a squamous cell carcinoma.  He was therefore taken back to the OR and underwent a completion lobectomy.  He did well postop and his chest tubes were removed without incident on postoperative day 3 and 4.  His wounds were without signs or infection and he was discharged to home.  Disposition:     Follow-up Information    [name], MD. Schedule an appointment as soon as possible for a visit in 1 week(s).   Specialties:  Cardiothoracic Surgery, General Surgery Why:  s/p right thoracotomy Contact information: [address] [place] [address] [phone]           [name], MD

## 2018-08-24 NOTE — Progress Notes (Signed)
Patient ID: Samuel Choi, male   DOB: 24-Mar-1966, 53 y.o.   MRN: [id number]  No problems overnight  Afebrile.  No air leak.  Wounds clean and dry.  Small amount of serous drainage on chest tube dressing and in chest tube.  Less than 50 cc from chest tubes  Labs showing improving WBC and chemistries  CXRay no change.  No pneumothorax.  Chest tube removed.  He wants to go home today.  Will see on Tuesday for labs and Friday for visit.  Discharge instructions given to patient and wife.  [employer].

## 2018-08-26 ENCOUNTER — Telehealth: Payer: Self-pay

## 2018-08-26 ENCOUNTER — Other Ambulatory Visit: Payer: Self-pay

## 2018-08-26 DIAGNOSIS — R918 Other nonspecific abnormal finding of lung field: Secondary | ICD-10-CM

## 2018-08-26 NOTE — Telephone Encounter (Signed)
Spoke with patient at this time. CBC,MET C 08/27/2018 and Chest xray prior to seeing Dr.Oaks 08/30/2018 @ 8 am.

## 2018-08-27 ENCOUNTER — Other Ambulatory Visit
Admission: RE | Admit: 2018-08-27 | Discharge: 2018-08-27 | Disposition: A | Discharge status: Home / Self Care | Payer: BLUE CROSS/BLUE SHIELD | Source: Ambulatory Visit | Attending: Cardiothoracic Surgery | Admitting: Cardiothoracic Surgery

## 2018-08-27 DIAGNOSIS — R918 Other nonspecific abnormal finding of lung field: Secondary | ICD-10-CM | POA: Insufficient documentation

## 2018-08-27 LAB — COMPREHENSIVE METABOLIC PANEL
ALT: 57 U/L — ABNORMAL HIGH (ref 0–44)
AST: 39 U/L (ref 15–41)
Albumin: 3.6 g/dL (ref 3.5–5.0)
Alkaline Phosphatase: 108 U/L (ref 38–126)
Anion gap: 7 (ref 5–15)
BUN: 16 mg/dL (ref 6–20)
CHLORIDE: 97 mmol/L — AB (ref 98–111)
CO2: 29 mmol/L (ref 22–32)
Calcium: 9.7 mg/dL (ref 8.9–10.3)
Creatinine, Ser: 1.07 mg/dL (ref 0.61–1.24)
GFR calc Af Amer: >60 mL/min (ref >60)
Glucose, Bld: 107 mg/dL — ABNORMAL HIGH (ref 70–99)
Potassium: 4.4 mmol/L (ref 3.5–5.1)
Sodium: 133 mmol/L — ABNORMAL LOW (ref 135–145)
Total Bilirubin: 0.6 mg/dL (ref 0.3–1.2)
Total Protein: 8.1 g/dL (ref 6.5–8.1)

## 2018-08-27 LAB — CBC WITH DIFFERENTIAL/PLATELET
Abs Immature Granulocytes: 0.18 10*3/uL — ABNORMAL HIGH (ref 0.00–0.07)
Basophils Absolute: 0.1 10*3/uL (ref 0.0–0.1)
Basophils Relative: 1 %
Eosinophils Absolute: 0.8 10*3/uL — ABNORMAL HIGH (ref 0.0–0.5)
Eosinophils Relative: 4 %
HCT: 38.2 % — ABNORMAL LOW (ref 39.0–52.0)
HEMOGLOBIN: 12.8 g/dL — AB (ref 13.0–17.0)
Immature Granulocytes: 1 %
Lymphocytes Relative: 14 %
Lymphs Abs: 2.6 10*3/uL (ref 0.7–4.0)
MCH: 32.7 pg (ref 26.0–34.0)
MCHC: 33.5 g/dL (ref 30.0–36.0)
MCV: 97.7 fL (ref 80.0–100.0)
Monocytes Absolute: 1.5 10*3/uL — ABNORMAL HIGH (ref 0.1–1.0)
Monocytes Relative: 8 %
Neutro Abs: 13.9 10*3/uL — ABNORMAL HIGH (ref 1.7–7.7)
Neutrophils Relative %: 72 %
Platelets: 580 10*3/uL — ABNORMAL HIGH (ref 150–400)
RBC: 3.91 MIL/uL — ABNORMAL LOW (ref 4.22–5.81)
RDW: 12.5 % (ref 11.5–15.5)
WBC: 19.1 10*3/uL — ABNORMAL HIGH (ref 4.0–10.5)
nRBC: 0 % (ref 0.0–0.2)

## 2018-08-29 ENCOUNTER — Ambulatory Visit
Admission: RE | Admit: 2018-08-29 | Discharge: 2018-08-29 | Disposition: A | Discharge status: Home / Self Care | Payer: BLUE CROSS/BLUE SHIELD | Source: Ambulatory Visit | Attending: Cardiothoracic Surgery | Admitting: Cardiothoracic Surgery

## 2018-08-29 DIAGNOSIS — R918 Other nonspecific abnormal finding of lung field: Secondary | ICD-10-CM

## 2018-08-30 ENCOUNTER — Ambulatory Visit: Payer: Self-pay | Admitting: Cardiothoracic Surgery

## 2018-09-02 ENCOUNTER — Ambulatory Visit (INDEPENDENT_AMBULATORY_CARE_PROVIDER_SITE_OTHER): Payer: BLUE CROSS/BLUE SHIELD | Admitting: Cardiothoracic Surgery

## 2018-09-02 ENCOUNTER — Other Ambulatory Visit: Payer: Self-pay

## 2018-09-02 ENCOUNTER — Encounter: Payer: Self-pay | Admitting: Cardiothoracic Surgery

## 2018-09-02 VITALS — BP 138/91 | HR 105 | Temp 97.9°F | Ht 73.0 in | Wt 142.0 lb

## 2018-09-02 DIAGNOSIS — R918 Other nonspecific abnormal finding of lung field: Secondary | ICD-10-CM

## 2018-09-02 MED ORDER — OXYCODONE-ACETAMINOPHEN 5-325 MG PO TABS: 1.0000 | ORAL_TABLET | Freq: Four times a day (QID) | ORAL | 0 refills | Status: DC | PRN

## 2018-09-02 MED ORDER — TRAMADOL HCL 50 MG PO TABS: 100.0000 mg | ORAL_TABLET | Freq: Four times a day (QID) | ORAL | 0 refills | Status: DC

## 2018-09-02 NOTE — Patient Instructions (Addendum)
Return in one month.  

## 2018-09-02 NOTE — Progress Notes (Signed)
He returns today in follow-up.  He denied any fevers or chills.  His blood pressure today was 138/91.  He states that his pain is pretty good throughout the day except in the evenings.  He did request a refill on his prescriptions for Percocet and tramadol.  He is scheduled to see Dr. Tasia Catchings tomorrow in the oncology department.  He did not complain of any shortness of breath or cough.  His thoracotomy wound is healing as expected.  There is no fluctuance or erythema.  His lungs are equal bilaterally.  His heart is regular.  He did have a chest x-ray made last week.  It shows no evidence of pneumothorax or pleural effusion.  We will remove his staples and sutures today.  I will see him back again in 1 month's time.

## 2018-09-03 ENCOUNTER — Encounter: Payer: Self-pay | Admitting: Oncology

## 2018-09-03 ENCOUNTER — Inpatient Hospital Stay: Payer: BLUE CROSS/BLUE SHIELD | Attending: Oncology | Admitting: Oncology

## 2018-09-03 ENCOUNTER — Other Ambulatory Visit: Payer: Self-pay

## 2018-09-03 ENCOUNTER — Encounter: Payer: Self-pay | Admitting: *Deleted

## 2018-09-03 VITALS — BP 130/72 | HR 109 | Temp 96.0°F | Ht 73.0 in | Wt 141.4 lb

## 2018-09-03 DIAGNOSIS — Z9181 History of falling: Secondary | ICD-10-CM | POA: Diagnosis not present

## 2018-09-03 DIAGNOSIS — Z720 Tobacco use: Secondary | ICD-10-CM | POA: Insufficient documentation

## 2018-09-03 DIAGNOSIS — Z7189 Other specified counseling: Secondary | ICD-10-CM | POA: Insufficient documentation

## 2018-09-03 DIAGNOSIS — Z801 Family history of malignant neoplasm of trachea, bronchus and lung: Secondary | ICD-10-CM

## 2018-09-03 DIAGNOSIS — I10 Essential (primary) hypertension: Secondary | ICD-10-CM

## 2018-09-03 DIAGNOSIS — M25551 Pain in right hip: Secondary | ICD-10-CM | POA: Diagnosis not present

## 2018-09-03 DIAGNOSIS — Z803 Family history of malignant neoplasm of breast: Secondary | ICD-10-CM | POA: Diagnosis not present

## 2018-09-03 DIAGNOSIS — C3491 Malignant neoplasm of unspecified part of right bronchus or lung: Secondary | ICD-10-CM | POA: Insufficient documentation

## 2018-09-03 DIAGNOSIS — C3411 Malignant neoplasm of upper lobe, right bronchus or lung: Secondary | ICD-10-CM

## 2018-09-03 NOTE — Progress Notes (Signed)
Patient here today for follow up.  Patient states no new concerns today  

## 2018-09-03 NOTE — Progress Notes (Signed)
Hematology/Oncology Follow up note [facility] [phone] Fax:([phone]   Patient Care Team: Tracie Harrier, MD as PCP - General (Internal Medicine) [name], RN as Registered Nurse  REFERRING PROVIDER: ED physician CHIEF COMPLAINTS/REASON FOR VISIT:  Evaluation of lung mass  HISTORY OF PRESENTING ILLNESS:  Samuel Choi is a  53 y.o.  male with PMH listed below who was referred to me for evaluation of lung mass. Patient recently presented to emergency room complaining of right hip pain.  He reports that he had a fell 3 days prior to the presentation, tripped on curb and fell onto his right hip.  Significant right hip pain since then. Patient also went to urgent care today and x-ray demonstrated right hip fracture.  Was sent to emergency room.  Also has a history of uncontrolled high blood pressure recently had abdomen angiogram done which showed no evidence of renal artery stenosis or other vascular findings. Patient had a CT scan of the hip demonstrate partial trochanter fracture.  Patient was advised to follow-up outpatient. Chest x-ray was done on 06/03/2018 which showed 50 mm mass in the right upper lobe.  And also possible right-sided mediastinal adenopathy.  CT chest with contrast was obtained in the ED which showed a 1.3 cm slightly shaggy marginated pulmonary nodule, with right hilar adenopathy.  Appearance concerning for lung malignancy.  Patient was advised to follow-up at cancer center for further management. Today patient was accompanied by his wife.  He has no respiratory symptoms.  Denies any chest pain, shortness of breath, cough, hemoptysis, or any weight loss. He drinks beer every other day chronically. Daily smoker, 7.5-pack-year smoking history.  # During the interval, patient has had below work up.  I have independently reviewed patient's image work up and pathology results.  #06/13/2018 PET scan  1. Right upper lobe  hypermetabolic pulmonary nodule with right hilar hypermetabolic adenopathy. 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 2. Mildly hypermetabolic focus in the deep left parotid gland is likely correlated with a 9 mm nodule. Suspicious for synchronous primary neoplasm (benign or malignant). Suboptimally evaluated secondary to beam hardening artifact in this region. Consider dedicated neck CT.  3. Right femoral hypermetabolism, likely due to a nondisplaced fracture on prior dedicated CT. 4. Aortic atherosclerosis (ICD10-I70.0) and emphysema (ICD10-J43.9).5. Bilateral nephrolithiasis.Problems and complaints are listed below:  #he was referred to pulmonology and was seen by [facility]. s/p Bronchoscopy on 06/25/2018. subcarina EBUS FNA was non diagnostic, hypocellular specimen.  # CT guided right upper lobe biopsy pathology showed dense fibrosis and mixed inflammatory cells with prominent polytypic plasma cells component. Focal benign bronchial wall and alveolar spaces. No malignancy was identified.    INTERVAL HISTORY Samuel Choi is a 53 y.o. male who has above history reviewed by me today presents for follow up visit for management of lung mass.   Patient was referred to Dr. Faith Rogue and he underwent right thoracotomy and wedge resection of a right upper lobe mass.  Frozen section was consistent with an inflammatory process.  On the second postop day, preliminary pathology reports high-grade malignancy.  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  Patient was therefore taken back to the OR and underwent take complete lobectomy and . Patient tolerated procedure and did well.  Following hospitalization, patient was seen by Dr. Genevive Bi outpatient on 09/02/2018.  Wound is healing well.  Staples and sutures were removed. Today patient is accompanied by daughter to discuss  pathology report and further management plan. He reports post  surgery pain is well controlled with Percocet 1 to 2 tablets every 6 hours as needed. Denies any shortness of breath, chest pain, abdominal pain, hemoptysis. Appetite is fair.  No new complaints.   Review of Systems  Constitutional: Negative for appetite change, chills, fatigue, fever and unexpected weight change.  HENT:   Negative for hearing loss and voice change.   Eyes: Negative for eye problems and icterus.  Respiratory: Negative for chest tightness, cough and shortness of breath.   Cardiovascular: Negative for chest pain and leg swelling.  Gastrointestinal: Negative for abdominal distention and abdominal pain.  Endocrine: Negative for hot flashes.  Genitourinary: Negative for difficulty urinating, dysuria and frequency.   Musculoskeletal: Negative for arthralgias.  Skin: Negative for itching and rash.  Neurological: Negative for light-headedness and numbness.  Hematological: Negative for adenopathy. Does not bruise/bleed easily.  Psychiatric/Behavioral: Negative for confusion.    MEDICAL HISTORY:  Past Medical History:  Diagnosis Date  . Alcohol abuse    usually drinks 2-3 drinks per day  . Atherosclerosis 06/2018  . Cancer ([place]) 06/2018   evaluating for lung cancer  . Chronic sinusitis   . Emphysema of lung ([place]) 06/2018   patient unaware of this.  . Hip fracture ([place]) 05/2018   no surgery  . History of kidney stones 05/2018   per xray, bilateral nephrolitiasis  . Hypertension     SURGICAL HISTORY: Past Surgical History:  Procedure Laterality Date  . BRAIN SURGERY  10/2017   nasal/sinus endoscopy. mass benign  . ELECTROMAGNETIC NAVIGATION BROCHOSCOPY Right 06/25/2018   Procedure: ELECTROMAGNETIC NAVIGATION BRONCHOSCOPY;  Surgeon: [name], MD;  Location: [facility] ORS;  Service: Cardiopulmonary;  Laterality: Right;  . ENDOBRONCHIAL ULTRASOUND Right 06/25/2018   Procedure: ENDOBRONCHIAL ULTRASOUND;  Surgeon: [name], MD;  Location: [facility] ORS;  Service:  Cardiopulmonary;  Laterality: Right;  . FLEXIBLE BRONCHOSCOPY N/A 08/20/2018   Procedure: FLEXIBLE BRONCHOSCOPY PREOP;  Surgeon: [name], MD;  Location: [facility] ORS;  Service: Thoracic;  Laterality: N/A;  . NASAL SINUS SURGERY  10/2017   At [facility], frontal sinusotomy, ethmoidectomy, resection anterior cranial fossa neoplasm, turbinate resection  . THORACOTOMY Right 08/13/2018   Procedure: PREOP BROCHOSCOPY WITH RIGHT THORACOTOMY AND RUL RESECTION;  Surgeon: [name], MD;  Location: [facility] ORS;  Service: General;  Laterality: Right;  . THORACOTOMY Right 08/20/2018   Procedure: THORACOTOMY MAJOR RIGHT UPPER LOBE LOBECTOMY;  Surgeon: [name], MD;  Location: [facility] ORS;  Service: Thoracic;  Laterality: Right;  . TOE SURGERY Left    pin in left toe    SOCIAL HISTORY: Social History   Socioeconomic History  . Marital status: Married    Spouse name: lisa  . Number of children: Not on file  . Years of education: Not on file  . Highest education level: Not on file  Occupational History  . 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 Financial resource strain: Not on file  . Food insecurity:    Worry: Not on file    Inability: Not on file  . Transportation needs:    Medical: Not on file    Non-medical: Not on file  Tobacco Use  . Smoking status: Current Every Day Smoker    Packs/day: 0.50    Years: 15.00    Pack years: 7.50    Types: Cigarettes  . Smokeless tobacco: Never Used  . Tobacco comment: attempting to cut back.Not  really interested in quitting  Substance and Sexual Activity  . Alcohol use: Yes    Alcohol/week: 3.0 standard drinks    Types: 3 Cans of beer per week    Comment: usually 2 drinks per week, per patient  . Drug use: No  . Sexual activity: Not on file  Lifestyle  . Physical activity:    Days per week: Not on file    Minutes per session: Not on file  . Stress: Not on file  Relationships  . Social connections:     Talks on phone: Not on file    Gets together: Not on file    Attends religious service: Not on file    Active member of club or organization: Not on file    Attends meetings of clubs or organizations: Not on file    Relationship status: Not on file  . Intimate partner violence:    Fear of current or ex partner: Not on file    Emotionally abused: Not on file    Physically abused: Not on file    Forced sexual activity: Not on file  Other Topics Concern  . Not on file  Social History Narrative  . Not on file    FAMILY HISTORY: Family History  Problem Relation Age of Onset  . Breast cancer Mother   . Diabetes Mother   . Lung cancer Father   . Hypertension Father     ALLERGIES:  has No Known Allergies.  MEDICATIONS:  Current Outpatient Medications  Medication Sig Dispense Refill  . amLODipine (NORVASC) 10 MG tablet Take 10 mg by mouth daily.     . cloNIDine (CATAPRES) 0.1 MG tablet Take 0.1 mg by mouth daily.   5  . hydrALAZINE (APRESOLINE) 100 MG tablet Take 100 mg by mouth 2 (two) times daily.     . hydrochlorothiazide (HYDRODIURIL) 12.5 MG tablet Take 12.5 mg by mouth daily.     . methocarbamol (ROBAXIN) 500 MG tablet Take 1 tablet (500 mg total) by mouth every 6 (six) hours. 30 tablet 0  . olmesartan (BENICAR) 40 MG tablet Take 40 mg by mouth daily.     Marland Kitchen oxyCODONE-acetaminophen (PERCOCET/ROXICET) 5-325 MG tablet Take 1-2 tablets by mouth every 6 (six) hours as needed for severe pain. 20 tablet 0  . potassium chloride SA (K-DUR,KLOR-CON) 20 MEQ tablet Take 20 mEq by mouth 2 (two) times daily.    . traMADol (ULTRAM) 50 MG tablet Take 2 tablets (100 mg total) by mouth every 6 (six) hours. 30 tablet 0   No current facility-administered medications for this visit.    Facility-Administered Medications Ordered in Other Visits  Medication Dose Route Frequency Provider Last Rate Last Dose  . albuterol (PROVENTIL) (2.5 MG/3ML) 0.083% nebulizer solution 2.5 mg  2.5 mg Nebulization  Once System, Provider Not In         PHYSICAL EXAMINATION: ECOG PERFORMANCE STATUS: 0 - Asymptomatic Vitals:   09/03/18 1305  BP: 130/72  Pulse: (!) 109  Temp: (!) 96 F (35.6 C)   Filed Weights   09/03/18 1305  Weight: 141 lb 6 oz (64.1 kg)    Physical Exam Constitutional:      General: He is not in acute distress.    Comments: Thin  HENT:     Head: Normocephalic and atraumatic.  Eyes:     General: No scleral icterus.    Pupils: Pupils are equal, round, and reactive to light.  Neck:     Musculoskeletal: Normal range of  motion and neck supple.  Cardiovascular:     Rate and Rhythm: Normal rate and regular rhythm.     Heart sounds: Normal heart sounds.  Pulmonary:     Effort: Pulmonary effort is normal. No respiratory distress.     Breath sounds: Normal breath sounds. No wheezing.     Comments: Right side chest wall surgical scar healing well, no erythema   Abdominal:     General: Bowel sounds are normal. There is no distension.     Palpations: Abdomen is soft. There is no mass.     Tenderness: There is no abdominal tenderness.  Musculoskeletal: Normal range of motion.        General: No deformity.  Skin:    General: Skin is warm and dry.     Findings: No erythema or rash.  Neurological:     Mental Status: He is alert and oriented to person, place, and time.     Cranial Nerves: No cranial nerve deficit.     Coordination: Coordination normal.  Psychiatric:        Behavior: Behavior normal.        Thought Content: Thought content normal.      LABORATORY DATA:  I have reviewed the data as listed Lab Results  Component Value Date   WBC 19.1 (H) 08/27/2018   HGB 12.8 (L) 08/27/2018   HCT 38.2 (L) 08/27/2018   MCV 97.7 08/27/2018   PLT 580 (H) 08/27/2018   Recent Labs    08/19/18 1505 08/23/18 0816 08/24/18 0901 08/27/18 0715  NA 131* 127* 128* 133*  K 4.2 4.5 4.2 4.4  CL 95* 89* 91* 97*  CO2 _0 GLUCOSE 121* 102* 158* 107*  BUN _1 CREATININE 0.90 1.11 0.88 1.07  CALCIUM 9.2 9.6 9.3 9.7  GFRNONAA >60 >60 >60 >60  GFRAA >60 >60 >60 >60  PROT 6.9  --  7.6 8.1  ALBUMIN 3.7  --  3.0* 3.6  AST 32  --  50* 39  ALT 22  --  42 57*  ALKPHOS 59  --  103 108  BILITOT 0.6  --  0.9 0.6   Iron/TIBC/Ferritin/ %Sat No results found for: IRON, TIBC, FERRITIN, IRONPCTSAT   Pathology # 08/13/2018 SPECIMEN SUBMITTED:  A. Lung, right upper lobe  DIAGNOSIS:  A. LUNG, RIGHT UPPER LOBE; WEDGE RESECTION:  - SQUAMOUS CELL CARCINOMA, NON-KERATINIZING, 1.2 CM, SEE SUMMARY BELOW.  - FIBROSIS AND INFLAMMATION FROM PREVIOUS CT-GUIDED CORE BIOPSY.   # 08/20/2018 SPECIMEN SUBMITTED:  A. Right upper lobe  B. Subcarinal lymph node from right lung LN7  C. Paratracheal lymph node R4 #1  D. Paratracheal lymph node R4 #2  E. Pre-vascular and retrotracheal lymph node R3 #1  F. Pre-vascular and retrotracheal lymph node R3 #2  G. Hilar lymph node R10 #1  H. Hilar lymph node R10 #2  I. Hilar lymph node R10 #3  J. Hilar lymph node R10 #4  DIAGNOSIS:  A. LUNG, RIGHT UPPER LOBE; LOBECTOMY:  - NEGATIVE FOR RESIDUAL CARCINOMA.  - CHANGES CONSISTENT WITH RECENT SURGICAL PROCEDURE.  - BLEB WITH SQUAMOUS METAPLASIA.  - MARGINS ARE NEGATIVE.  B. LYMPH NODE, RIGHT SUBCARINAL LN 7; EXCISION:  - LYMPH NODE NEGATIVE FOR MALIGNANCY.  C. LYMPH NODE, PARATRACHEAL R4 #1; EXCISION:  - LYMPH NODE NEGATIVE FOR MALIGNANCY.   D. LYMPH NODE, PARATRACHEAL R4 #2; EXCISION:  - LYMPH NODE NEGATIVE FOR MALIGNANCY.  E. LYMPH NODE, PERIVASCULAR AND RETROTRACHEAL R3 #1;  EXCISION:  - LYMPH NODE NEGATIVE FOR MALIGNANCY.  F. LYMPH NODE, PERIVASCULAR AND RETROTRACHEAL LYMPH NODE R3 #2;  EXCISION:  - LYMPH NODE NEGATIVE FOR MALIGNANCY.  G. LYMPH NODE, HILAR R10 #1; EXCISION:  - LYMPH NODE NEGATIVE FOR MALIGNANCY.  H. LYMPH NODE, HILAR R10 #2; EXCISION:  - ALVEOLATED LUNG TISSUE.  - LYMPH NODE TISSUE NOT IDENTIFIED.  - NEGATIVE FOR MALIGNANCY.  I. LYMPH  NODE, HILAR R10 #3; EXCISION:  - LYMPHOID TISSUE NEGATIVE FOR MALIGNANCY.  J. LYMPH NODE, HILAR R10 #4; EXCISION:  - BRONCHIAL TISSUE WITH SQUAMOUS METAPLASIA.  - LYMPH NODE TISSUE NOT IDENTIFIED.  - NEGATIVE FOR MALIGNANCY.    ASSESSMENT & PLAN:  1. Squamous cell carcinoma of lung, right ([place])   2. Tobacco abuse   3. Goals of care, counseling/discussion    Pathology results were discussed in details with patient.  The diagnosis and care plan were discussed with patient in detail.  NCCN guidelines were reviewed and shared with patient.   He has pT1b pN0 cM0 stage I squamous cell lung cancer.  Margin is negative.  Recommend observation.  Will obtain post operation baseline CT in 3 months. After that, will obtain CT chest every 6 months for 2- 3 years, and then annual CT.   # Smoking cessation was discussed with patient   We spent sufficient time to discuss many aspect of care, questions were answered to patient's satisfaction.   Orders Placed This Encounter  Procedures  . CT Chest W Contrast    Standing Status:   Future    Standing Expiration Date:   09/03/2019    Order Specific Question:   ** REASON FOR EXAM (FREE TEXT)    Answer:   Lung cancer follow up    Order Specific Question:   If indicated for the ordered procedure, I authorize the administration of contrast media per Radiology protocol    Answer:   Yes    Order Specific Question:   Preferred imaging location?    Answer:   [facility]    Order Specific Question:   Radiology Contrast Protocol - do NOT remove file path    Answer:   [email or web address]    All questions were answered. The patient knows to call the clinic with any problems questions or concerns.  Return of visit: 3 months  We spent sufficient time to discuss many aspect of care, questions were answered to patient's satisfaction. Total face to face encounter time for this patient visit was 25 min. >50% of the time was  spent  in counseling and coordination of care.   Earlie Server, MD, PhD Hematology Oncology [facility] at [facility] Pager- [phone] 09/03/2018

## 2018-09-03 NOTE — Progress Notes (Signed)
  Oncology Nurse Navigator Documentation  Navigator Location: [facility] Onc (09/03/18 1300)   )Navigator Encounter Type: Follow-up Appt (09/03/18 1300)     Confirmed Diagnosis Date: 08/19/18 (09/03/18 1300) Surgery Date: 08/19/18 (09/03/18 1300)           Treatment Initiated Date: 08/19/18 (09/03/18 1300) Patient Visit Type: MedOnc (09/03/18 1300) Treatment Phase: Post-Tx Follow-up (09/03/18 1300) Barriers/Navigation Needs: Education (09/03/18 1300) Education: Newly Diagnosed Cancer Education;Understanding Cancer/ Treatment Options (09/03/18 1300) Interventions: Education (09/03/18 1300)     Education Method: Verbal;Written (09/03/18 1300)           met with patient and his wife during follow up visit with Dr. Tasia Catchings to review final pathology results and discuss plan for further follow up. All questions answered during visit. Pt given resources regarding diagnosis and supportive services available. Pt given contact info for social services to apply for disability. Informed pt that if has disability benefits through employer then we can assist with forms if needed. Instructed to call with any further questions or needs. Pt and wife verbalized understanding.     Time Spent with Patient: 30 (09/03/18 1300)

## 2018-09-06 ENCOUNTER — Ambulatory Visit: Payer: BLUE CROSS/BLUE SHIELD | Admitting: Oncology

## 2018-09-12 LAB — FUNGAL ORGANISM REFLEX

## 2018-09-12 LAB — FUNGUS CULTURE WITH STAIN

## 2018-09-12 LAB — FUNGUS CULTURE RESULT

## 2018-09-26 LAB — ACID FAST CULTURE WITH REFLEXED SENSITIVITIES: ACID FAST CULTURE - AFSCU3: NEGATIVE

## 2018-09-26 LAB — ACID FAST CULTURE WITH REFLEXED SENSITIVITIES (MYCOBACTERIA)

## 2018-09-30 ENCOUNTER — Encounter: Payer: Self-pay | Admitting: Cardiothoracic Surgery

## 2018-09-30 ENCOUNTER — Ambulatory Visit
Admission: RE | Admit: 2018-09-30 | Discharge: 2018-09-30 | Disposition: A | Discharge status: Home / Self Care | Payer: BLUE CROSS/BLUE SHIELD | Source: Ambulatory Visit | Attending: Cardiothoracic Surgery | Admitting: Cardiothoracic Surgery

## 2018-09-30 ENCOUNTER — Other Ambulatory Visit: Payer: Self-pay

## 2018-09-30 ENCOUNTER — Ambulatory Visit (INDEPENDENT_AMBULATORY_CARE_PROVIDER_SITE_OTHER): Payer: BLUE CROSS/BLUE SHIELD | Admitting: Cardiothoracic Surgery

## 2018-09-30 VITALS — BP 197/111 | HR 85 | Temp 97.7°F | Ht 73.0 in | Wt 139.0 lb

## 2018-09-30 DIAGNOSIS — R918 Other nonspecific abnormal finding of lung field: Secondary | ICD-10-CM

## 2018-09-30 NOTE — Progress Notes (Signed)
  Patient ID: Samuel Choi, male   DOB: 1965-07-16, 53 y.o.   MRN: [id number]  HISTORY: He is now about 6 weeks out from his right upper lobectomy.  He states that his breathing is quite good.  He is not short of breath.  He has some discomfort but is not taking any pain medications including Tylenol or Advil.   Vitals:   09/30/18 0852  BP: (!) 197/111  Pulse: 85  Temp: 97.7 F (36.5 C)  SpO2: 95%     EXAM:    Resp: Lungs are clear bilaterally.  No respiratory distress, normal effort. Heart:  Regular without murmurs Abd:  Abdomen is soft, non distended and non tender. No masses are palpable.  There is no rebound and no guarding.  Neurological: Alert and oriented to person, place, and time. Coordination normal.  Skin: Skin is warm and dry. No rash noted. No diaphoretic. No erythema. No pallor. His thoracotomy wound is healing as expected. Psychiatric: Normal mood and affect. Normal behavior. Judgment and thought content normal.    ASSESSMENT: Right upper lobectomy.  His chest x-ray today look quite good.   PLAN:   I told him that my normal routine is to see him back at 3 months however we are in some unusual circumstances.  He tells me he is seeing Dr. [name] Householder later this month and I will discharge him from my care to follow-up with her.  Of course I would be happy to see him if there is any questions.    [name], MD

## 2018-09-30 NOTE — Patient Instructions (Signed)
Return as needed.The patient is aware to call back for any questions or concerns.  

## 2018-10-30 ENCOUNTER — Telehealth: Payer: Self-pay

## 2018-10-30 NOTE — Telephone Encounter (Signed)
Disability paperwork received from [facility] for patient. Faxed to [employer] (with confirmation) for completion.

## 2018-10-31 ENCOUNTER — Encounter: Payer: Self-pay | Admitting: Oncology

## 2018-12-02 ENCOUNTER — Ambulatory Visit: Payer: BLUE CROSS/BLUE SHIELD

## 2018-12-03 ENCOUNTER — Other Ambulatory Visit: Payer: Self-pay

## 2018-12-03 ENCOUNTER — Ambulatory Visit
Admission: RE | Admit: 2018-12-03 | Discharge: 2018-12-03 | Disposition: A | Discharge status: Home / Self Care | Payer: BLUE CROSS/BLUE SHIELD | Source: Ambulatory Visit | Attending: Oncology | Admitting: Oncology

## 2018-12-03 DIAGNOSIS — C3491 Malignant neoplasm of unspecified part of right bronchus or lung: Secondary | ICD-10-CM

## 2018-12-03 LAB — POCT I-STAT CREATININE: Creatinine, Ser: 0.8 mg/dL (ref 0.61–1.24)

## 2018-12-03 MED ORDER — IOHEXOL 300 MG/ML  SOLN
75.0000 mL | Freq: Once | INTRAMUSCULAR | Status: AC | PRN
  Administered 2018-12-03: 75 mL via INTRAVENOUS

## 2018-12-04 ENCOUNTER — Other Ambulatory Visit: Payer: Self-pay

## 2018-12-05 ENCOUNTER — Inpatient Hospital Stay: Payer: BC Managed Care – PPO | Attending: Oncology | Admitting: Oncology

## 2018-12-05 ENCOUNTER — Other Ambulatory Visit: Payer: Self-pay

## 2018-12-05 ENCOUNTER — Encounter: Payer: Self-pay | Admitting: Oncology

## 2018-12-05 VITALS — BP 172/92 | HR 106 | Temp 98.0°F | Resp 18 | Wt 142.2 lb

## 2018-12-05 DIAGNOSIS — Z720 Tobacco use: Secondary | ICD-10-CM | POA: Diagnosis not present

## 2018-12-05 DIAGNOSIS — I10 Essential (primary) hypertension: Secondary | ICD-10-CM | POA: Insufficient documentation

## 2018-12-05 DIAGNOSIS — C3491 Malignant neoplasm of unspecified part of right bronchus or lung: Secondary | ICD-10-CM

## 2018-12-05 DIAGNOSIS — C3411 Malignant neoplasm of upper lobe, right bronchus or lung: Secondary | ICD-10-CM | POA: Diagnosis not present

## 2018-12-05 DIAGNOSIS — R0781 Pleurodynia: Secondary | ICD-10-CM | POA: Diagnosis not present

## 2018-12-05 NOTE — Progress Notes (Signed)
Patient here for follow up. Pt states he is having sharp pains to right ribcage. Blood pressure elevated, denies headache, dizziness or lightheadedness.

## 2018-12-06 ENCOUNTER — Other Ambulatory Visit: Payer: Self-pay | Admitting: *Deleted

## 2018-12-08 NOTE — Progress Notes (Signed)
Hematology/Oncology Follow up note [facility] [phone] [phone]   Patient Care Team: Tracie Harrier, MD as PCP - General (Internal Medicine) [name], RN as Registered Nurse  REFERRING PROVIDER: ED physician CHIEF COMPLAINTS/REASON FOR VISIT:  Evaluation of lung mass  HISTORY OF PRESENTING ILLNESS:  Samuel Choi is a  53 y.o.  male with PMH listed below who was referred to me for evaluation of lung mass. Patient recently presented to emergency room complaining of right hip pain.  He reports that he had a fell 3 days prior to the presentation, tripped on curb and fell onto his right hip.  Significant right hip pain since then. Patient also went to urgent care today and x-ray demonstrated right hip fracture.  Was sent to emergency room.  Also has a history of uncontrolled high blood pressure recently had abdomen angiogram done which showed no evidence of renal artery stenosis or other vascular findings. Patient had a CT scan of the hip demonstrate partial trochanter fracture.  Patient was advised to follow-up outpatient. Chest x-ray was done on 06/03/2018 which showed 50 mm mass in the right upper lobe.  And also possible right-sided mediastinal adenopathy.  CT chest with contrast was obtained in the ED which showed a 1.3 cm slightly shaggy marginated pulmonary nodule, with right hilar adenopathy.  Appearance concerning for lung malignancy.  Patient was advised to follow-up at cancer center for further management. Today patient was accompanied by his wife.  He has no respiratory symptoms.  Denies any chest pain, shortness of breath, cough, hemoptysis, or any weight loss. He drinks beer every other day chronically. Daily smoker, 7.5-pack-year smoking history.  # During the interval, patient has had below work up.  I have independently reviewed patient's image work up and pathology results.  #06/13/2018 PET scan  1. Right upper lobe  hypermetabolic pulmonary nodule with right hilar hypermetabolic adenopathy. 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 2. Mildly hypermetabolic focus in the deep left parotid gland is likely correlated with a 9 mm nodule. Suspicious for synchronous primary neoplasm (benign or malignant). Suboptimally evaluated secondary to beam hardening artifact in this region. Consider dedicated neck CT.  3. Right femoral hypermetabolism, likely due to a nondisplaced fracture on prior dedicated CT. 4. Aortic atherosclerosis (ICD10-I70.0) and emphysema (ICD10-J43.9).5. Bilateral nephrolithiasis.Problems and complaints are listed below:  #he was referred to pulmonology and was seen by [facility]. s/p Bronchoscopy on 06/25/2018. subcarina EBUS FNA was non diagnostic, hypocellular specimen.  # CT guided right upper lobe biopsy pathology showed dense fibrosis and mixed inflammatory cells with prominent polytypic plasma cells component. Focal benign bronchial wall and alveolar spaces. No malignancy was identified.   # Patient was referred to Dr. Faith Rogue and he underwent right thoracotomy and wedge resection of a right upper lobe mass.  Frozen section was consistent with an inflammatory process.  On the second postop day, preliminary pathology reports high-grade malignancy.  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  Margin is negative.  Recommend observation.    INTERVAL HISTORY Samuel Choi is a 53 y.o. male who has above history reviewed by me today presents for follow-up management of stage I squamous cell lung cancer. Patient had surveillance CT done during interval. He reports to nurse that he occasionally have sharp pains to right  rib cage, intermittent, no exacerbating or alleviating factors..  Blood  pressures are high 172/92.  Denies any headache, dizziness or lightheadedness.  Denies cough, hemoptysis, shortness of breath.  Review of Systems  Constitutional: Negative for appetite change, chills, fatigue, fever and unexpected weight change.  HENT:   Negative for hearing loss and voice change.   Eyes: Negative for eye problems and icterus.  Respiratory: Negative for chest tightness, cough and shortness of breath.   Cardiovascular: Negative for chest pain and leg swelling.       Sharp rib cage pain  Gastrointestinal: Negative for abdominal distention and abdominal pain.  Endocrine: Negative for hot flashes.  Genitourinary: Negative for difficulty urinating, dysuria and frequency.   Musculoskeletal: Negative for arthralgias.  Skin: Negative for itching and rash.  Neurological: Negative for light-headedness and numbness.  Hematological: Negative for adenopathy. Does not bruise/bleed easily.  Psychiatric/Behavioral: Negative for confusion.    MEDICAL HISTORY:  Past Medical History:  Diagnosis Date  . Alcohol abuse    usually drinks 2-3 drinks per day  . Atherosclerosis 06/2018  . Chronic sinusitis   . Emphysema of lung ([place]) 06/2018   patient unaware of this.  . Hip fracture ([place]) 05/2018   no surgery  . History of kidney stones 05/2018   per xray, bilateral nephrolitiasis  . Hypertension   . Squamous cell carcinoma of lung, right ([place]) 06/2018    SURGICAL HISTORY: Past Surgical History:  Procedure Laterality Date  . BRAIN SURGERY  10/2017   nasal/sinus endoscopy. mass benign  . ELECTROMAGNETIC NAVIGATION BROCHOSCOPY Right 06/25/2018   Procedure: ELECTROMAGNETIC NAVIGATION BRONCHOSCOPY;  Surgeon: [name], MD;  Location: [facility] ORS;  Service: Cardiopulmonary;  Laterality: Right;  . ENDOBRONCHIAL ULTRASOUND Right 06/25/2018   Procedure: ENDOBRONCHIAL ULTRASOUND;  Surgeon: [name], MD;  Location: [facility] ORS;  Service: Cardiopulmonary;  Laterality: Right;  . FLEXIBLE  BRONCHOSCOPY N/A 08/20/2018   Procedure: FLEXIBLE BRONCHOSCOPY PREOP;  Surgeon: [name], MD;  Location: [facility] ORS;  Service: Thoracic;  Laterality: N/A;  . NASAL SINUS SURGERY  10/2017   At [facility], frontal sinusotomy, ethmoidectomy, resection anterior cranial fossa neoplasm, turbinate resection  . THORACOTOMY Right 08/13/2018   Procedure: PREOP BROCHOSCOPY WITH RIGHT THORACOTOMY AND RUL RESECTION;  Surgeon: [name], MD;  Location: [facility] ORS;  Service: General;  Laterality: Right;  . THORACOTOMY Right 08/20/2018   Procedure: THORACOTOMY MAJOR RIGHT UPPER LOBE LOBECTOMY;  Surgeon: [name], MD;  Location: [facility] ORS;  Service: Thoracic;  Laterality: Right;  . TOE SURGERY Left    pin in left toe    SOCIAL HISTORY: Social History   Socioeconomic History  . Marital status: Married    Spouse name: lisa  . Number of children: Not on file  . Years of education: Not on file  . Highest education level: Not on file  Occupational History  . 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 Financial resource strain: Not on file  . Food insecurity:    Worry: Not on file    Inability: Not on file  . Transportation needs:    Medical: Not on file    Non-medical: Not on file  Tobacco Use  . Smoking status: Current Every Day Smoker    Packs/day: 0.50    Years: 15.00    Pack years: 7.50    Types: Cigarettes  . Smokeless tobacco: Never Used  . Tobacco comment: attempting to cut back.Not really interested in quitting  Substance  and Sexual Activity  . Alcohol use: Yes    Alcohol/week: 3.0 standard drinks    Types: 3 Cans of beer per week    Comment: usually 2 drinks per week, per patient  . Drug use: No  . Sexual activity: Not on file  Lifestyle  . Physical activity:    Days per week: Not on file    Minutes per session: Not on file  . Stress: Not on file  Relationships  . Social connections:    Talks on phone: Not on file    Gets together: Not  on file    Attends religious service: Not on file    Active member of club or organization: Not on file    Attends meetings of clubs or organizations: Not on file    Relationship status: Not on file  . Intimate partner violence:    Fear of current or ex partner: Not on file    Emotionally abused: Not on file    Physically abused: Not on file    Forced sexual activity: Not on file  Other Topics Concern  . Not on file  Social History Narrative  . Not on file    FAMILY HISTORY: Family History  Problem Relation Age of Onset  . Breast cancer Mother   . Diabetes Mother   . Lung cancer Father   . Hypertension Father     ALLERGIES:  has No Known Allergies.  MEDICATIONS:  Current Outpatient Medications  Medication Sig Dispense Refill  . amLODipine (NORVASC) 10 MG tablet Take 10 mg by mouth daily.     . cloNIDine (CATAPRES) 0.1 MG tablet Take 0.1 mg by mouth daily.   5  . hydrALAZINE (APRESOLINE) 100 MG tablet Take 100 mg by mouth 2 (two) times daily.     . hydrochlorothiazide (HYDRODIURIL) 12.5 MG tablet Take 12.5 mg by mouth daily.     . methocarbamol (ROBAXIN) 500 MG tablet Take 1 tablet (500 mg total) by mouth every 6 (six) hours. 30 tablet 0  . olmesartan (BENICAR) 40 MG tablet Take 40 mg by mouth daily.     Marland Kitchen oxyCODONE-acetaminophen (PERCOCET/ROXICET) 5-325 MG tablet Take 1-2 tablets by mouth every 6 (six) hours as needed for severe pain. 20 tablet 0  . potassium chloride SA (K-DUR,KLOR-CON) 20 MEQ tablet Take 20 mEq by mouth 2 (two) times daily.    . traMADol (ULTRAM) 50 MG tablet Take 2 tablets (100 mg total) by mouth every 6 (six) hours. 30 tablet 0   No current facility-administered medications for this visit.    Facility-Administered Medications Ordered in Other Visits  Medication Dose Route Frequency Provider Last Rate Last Dose  . albuterol (PROVENTIL) (2.5 MG/3ML) 0.083% nebulizer solution 2.5 mg  2.5 mg Nebulization Once System, Provider Not In         PHYSICAL  EXAMINATION: ECOG PERFORMANCE STATUS: 0 - Asymptomatic Vitals:   12/05/18 1056  BP: (!) 172/92  Pulse: (!) 106  Resp: 18  Temp: 98 F (36.7 C)  SpO2: 95%   Filed Weights   12/05/18 1056  Weight: 142 lb 3.2 oz (64.5 kg)    Physical Exam Constitutional:      General: He is not in acute distress.    Comments: Thin  HENT:     Head: Normocephalic and atraumatic.  Eyes:     General: No scleral icterus.    Pupils: Pupils are equal, round, and reactive to light.  Neck:     Musculoskeletal: Normal range of  motion and neck supple.  Cardiovascular:     Rate and Rhythm: Normal rate and regular rhythm.     Heart sounds: Normal heart sounds.  Pulmonary:     Effort: Pulmonary effort is normal. No respiratory distress.     Breath sounds: Normal breath sounds. No wheezing.     Comments: Right side chest wall surgical scar healing well, no erythema   Abdominal:     General: Bowel sounds are normal. There is no distension.     Palpations: Abdomen is soft. There is no mass.     Tenderness: There is no abdominal tenderness.  Musculoskeletal: Normal range of motion.        General: No deformity.  Skin:    General: Skin is warm and dry.     Findings: No erythema or rash.  Neurological:     Mental Status: He is alert and oriented to person, place, and time.     Cranial Nerves: No cranial nerve deficit.     Coordination: Coordination normal.  Psychiatric:        Behavior: Behavior normal.        Thought Content: Thought content normal.      LABORATORY DATA:  I have reviewed the data as listed Lab Results  Component Value Date   WBC 19.1 (H) 08/27/2018   HGB 12.8 (L) 08/27/2018   HCT 38.2 (L) 08/27/2018   MCV 97.7 08/27/2018   PLT 580 (H) 08/27/2018   Recent Labs    08/19/18 1505 08/23/18 0816 08/24/18 0901 08/27/18 0715 12/03/18 0929  NA 131* 127* 128* 133*  --   K 4.2 4.5 4.2 4.4  --   CL 95* 89* 91* 97*  --   CO2 _0 --   GLUCOSE 121* 102* 158* 107*  --    BUN _1 --   CREATININE 0.90 1.11 0.88 1.07 0.80  CALCIUM 9.2 9.6 9.3 9.7  --   GFRNONAA >60 >60 >60 >60  --   GFRAA >60 >60 >60 >60  --   PROT 6.9  --  7.6 8.1  --   ALBUMIN 3.7  --  3.0* 3.6  --   AST 32  --  50* 39  --   ALT 22  --  42 57*  --   ALKPHOS 59  --  103 108  --   BILITOT 0.6  --  0.9 0.6  --    Iron/TIBC/Ferritin/ %Sat No results found for: IRON, TIBC, FERRITIN, IRONPCTSAT   Pathology # 08/13/2018 SPECIMEN SUBMITTED:  A. Lung, right upper lobe  DIAGNOSIS:  A. LUNG, RIGHT UPPER LOBE; WEDGE RESECTION:  - SQUAMOUS CELL CARCINOMA, NON-KERATINIZING, 1.2 CM, SEE SUMMARY BELOW.  - FIBROSIS AND INFLAMMATION FROM PREVIOUS CT-GUIDED CORE BIOPSY.   # 08/20/2018 SPECIMEN SUBMITTED:  A. Right upper lobe  B. Subcarinal lymph node from right lung LN7  C. Paratracheal lymph node R4 #1  D. Paratracheal lymph node R4 #2  E. Pre-vascular and retrotracheal lymph node R3 #1  F. Pre-vascular and retrotracheal lymph node R3 #2  G. Hilar lymph node R10 #1  H. Hilar lymph node R10 #2  I. Hilar lymph node R10 #3  J. Hilar lymph node R10 #4  DIAGNOSIS:  A. LUNG, RIGHT UPPER LOBE; LOBECTOMY:  - NEGATIVE FOR RESIDUAL CARCINOMA.  - CHANGES CONSISTENT WITH RECENT SURGICAL PROCEDURE.  - BLEB WITH SQUAMOUS METAPLASIA.  - MARGINS ARE NEGATIVE.  B. LYMPH NODE, RIGHT SUBCARINAL LN 7;  EXCISION:  - LYMPH NODE NEGATIVE FOR MALIGNANCY.  C. LYMPH NODE, PARATRACHEAL R4 #1; EXCISION:  - LYMPH NODE NEGATIVE FOR MALIGNANCY.   D. LYMPH NODE, PARATRACHEAL R4 #2; EXCISION:  - LYMPH NODE NEGATIVE FOR MALIGNANCY.  E. LYMPH NODE, PERIVASCULAR AND RETROTRACHEAL R3 #1; EXCISION:  - LYMPH NODE NEGATIVE FOR MALIGNANCY.  F. LYMPH NODE, PERIVASCULAR AND RETROTRACHEAL LYMPH NODE R3 #2;  EXCISION:  - LYMPH NODE NEGATIVE FOR MALIGNANCY.  G. LYMPH NODE, HILAR R10 #1; EXCISION:  - LYMPH NODE NEGATIVE FOR MALIGNANCY.  H. LYMPH NODE, HILAR R10 #2; EXCISION:  - ALVEOLATED LUNG TISSUE.  - LYMPH  NODE TISSUE NOT IDENTIFIED.  - NEGATIVE FOR MALIGNANCY.  I. LYMPH NODE, HILAR R10 #3; EXCISION:  - LYMPHOID TISSUE NEGATIVE FOR MALIGNANCY.  J. LYMPH NODE, HILAR R10 #4; EXCISION:  - BRONCHIAL TISSUE WITH SQUAMOUS METAPLASIA.  - LYMPH NODE TISSUE NOT IDENTIFIED.  - NEGATIVE FOR MALIGNANCY.    ASSESSMENT & PLAN:  1. Squamous cell carcinoma of lung, right [facility])   Surveillance CT images were independently reviewed by me and discussed with patient. 12/03/2018 CT chest with contrast showed abnormal soft tissue in the right suprahilar region measuring up to 2.5 cm abutting suture line.  Worrisome for recurrence. Discussed with patient that I am concerned this might be a recurrent of his cancer, recommend a PET scan for further evaluation. We will discuss with Dr. Genevive Bi.  If no further surgery options, then will refer to radiation oncology for SBRT.  # Smoking cessation was discussed with patient   We spent sufficient time to discuss many aspect of care, questions were answered to patient's satisfaction.   Orders Placed This Encounter  Procedures  . NM PET Image Restag (PS) Skull Base To Thigh    Standing Status:   Future    Standing Expiration Date:   12/05/2019    Order Specific Question:   ** REASON FOR EXAM (FREE TEXT)    Answer:   restaging lung cancer recurrence    Order Specific Question:   If indicated for the ordered procedure, I authorize the administration of a radiopharmaceutical per Radiology protocol    Answer:   Yes    Order Specific Question:   Preferred imaging location?    Answer:   [facility]    Order Specific Question:   Radiology Contrast Protocol - do NOT remove file path    Answer:   [email or web address]    All questions were answered. The patient knows to call the clinic with any problems questions or concerns.  Return of visit: To be determined. We spent sufficient time to discuss many aspect of care, questions were answered to  patient's satisfaction. Total face to face encounter time for this patient visit was 25 min. >50% of the time was  spent in counseling and coordination of care.    Earlie Server, MD, PhD Hematology Oncology [facility] Pager- [phone] 12/08/2018

## 2018-12-10 ENCOUNTER — Ambulatory Visit
Admission: RE | Admit: 2018-12-10 | Discharge: 2018-12-10 | Disposition: A | Discharge status: Home / Self Care | Payer: BC Managed Care – PPO | Source: Ambulatory Visit | Attending: Oncology | Admitting: Oncology

## 2018-12-10 ENCOUNTER — Other Ambulatory Visit: Payer: Self-pay

## 2018-12-10 DIAGNOSIS — C3491 Malignant neoplasm of unspecified part of right bronchus or lung: Secondary | ICD-10-CM

## 2018-12-10 LAB — GLUCOSE, CAPILLARY: Glucose-Capillary: 64 mg/dL — ABNORMAL LOW (ref 70–99)

## 2018-12-10 MED ORDER — FLUDEOXYGLUCOSE F - 18 (FDG) INJECTION
7.6000 | Freq: Once | INTRAVENOUS | Status: AC | PRN
  Administered 2018-12-10: 7.81 via INTRAVENOUS

## 2018-12-12 ENCOUNTER — Other Ambulatory Visit: Payer: BLUE CROSS/BLUE SHIELD

## 2018-12-13 NOTE — Progress Notes (Addendum)
Tumor Board Documentation  Samuel Choi was presented by Dr Tasia Catchings at our Tumor Board on 12/12/2018, which included representatives from medical oncology, radiation oncology, surgical, radiology, pathology, navigation, internal medicine, research.  Samuel Choi currently presents as a current patient, for discussion, for progression with history of the following treatments: surgical intervention(s).  Additionally, we reviewed previous medical and familial history, history of present illness, and recent lab results along with all available histopathologic and imaging studies. The tumor board considered available treatment options and made the following recommendations:   Concurrent Radiation and chemotherapy The following procedures/referrals were also placed: No orders of the defined types were placed in this encounter.   Clinical Trial Status: not discussed   Staging used: AJCC Stage Group  AJCC Staging: T: T1b N: N1   Group: Stage 2 B NET of Lung   National site-specific guidelines NCCN were discussed with respect to the case.  Tumor board is a meeting of clinicians from various specialty areas who evaluate and discuss patients for whom a multidisciplinary approach is being considered. Final determinations in the plan of care are those of the provider(s). The responsibility for follow up of recommendations given during tumor board is that of the provider.   Today's extended care, comprehensive team conference, Samuel Choi was not present for the discussion and was not examined.   Multidisciplinary Tumor Board is a multidisciplinary case peer review process.  Decisions discussed in the Multidisciplinary Tumor Board reflect the opinions of the specialists present at the conference without having examined the patient.  Ultimately, treatment and diagnostic decisions rest with the primary provider(s) and the patient.

## 2018-12-18 ENCOUNTER — Telehealth: Payer: Self-pay | Admitting: *Deleted

## 2018-12-18 NOTE — Telephone Encounter (Signed)
Patient called asking for results of PET Scan. He has no follow up visits scheduled. Tumor board discussion was for surgery vs SBRT.  IMPRESSION: 1. Hypermetabolic RIGHT suprahilar mass consistent with lung cancer recurrence. 2. No evidence of local recurrence in RIGHT upper lobe. 3. No evidence of distant metastatic disease. 4. Small RIGHT effusion. 5. Persistent hypermetabolic lesion in LEFT parotid gland. Favor primary parotid neoplasm. Consider ENT consultation.   Electronically Signed   By: Suzy Bouchard M.D.   On: 12/10/2018 13:57

## 2018-12-18 NOTE — Telephone Encounter (Signed)
Per Dr. Tasia Catchings, there is local recurrence and she is waiting for Dr. Genevive Bi to communicate on how to proceed with biopsy. Patient notified and voiced understanding.

## 2018-12-23 NOTE — Progress Notes (Signed)
PSN called patient via phone today to discuss the charitable funds with him since he is unable to work.  Patient wants to discuss with his wife before getting back with PSN.  PSN offered to meet with patient and wife to discuss their financail concerns and put systems in place to help.

## 2018-12-24 ENCOUNTER — Telehealth: Payer: Self-pay | Admitting: Internal Medicine

## 2018-12-24 NOTE — Telephone Encounter (Signed)
Called patient for COVID-19 pre-screening for in office visit.  Have you recently traveled any where out of the local area in the last 2 weeks? No Have you been in close contact with a person diagnosed with COVID-19 within the last 2 weeks? No  Do you currently have any of the following symptoms? If so, when did they start? Cough     Diarrhea  Joint Pain Fever      Muscle Pain  Red eyes Shortness of breath   Abdominal pain Vomiting Loss of smell    Rash   Sore Throat Headache    Weakness  Bruising or bleeding   Okay to proceed with visit 12/25/2018

## 2018-12-25 ENCOUNTER — Other Ambulatory Visit
Admission: RE | Admit: 2018-12-25 | Discharge: 2018-12-25 | Disposition: A | Discharge status: Home / Self Care | Payer: BC Managed Care – PPO | Source: Ambulatory Visit | Attending: Internal Medicine | Admitting: Internal Medicine

## 2018-12-25 ENCOUNTER — Other Ambulatory Visit: Payer: Self-pay

## 2018-12-25 ENCOUNTER — Ambulatory Visit (INDEPENDENT_AMBULATORY_CARE_PROVIDER_SITE_OTHER): Payer: BC Managed Care – PPO | Admitting: Internal Medicine

## 2018-12-25 ENCOUNTER — Encounter: Payer: Self-pay | Admitting: Internal Medicine

## 2018-12-25 VITALS — BP 160/100 | HR 89 | Temp 97.2°F | Ht 73.0 in | Wt 141.8 lb

## 2018-12-25 DIAGNOSIS — Z1159 Encounter for screening for other viral diseases: Secondary | ICD-10-CM | POA: Diagnosis not present

## 2018-12-25 DIAGNOSIS — C3401 Malignant neoplasm of right main bronchus: Secondary | ICD-10-CM | POA: Diagnosis not present

## 2018-12-25 DIAGNOSIS — R599 Enlarged lymph nodes, unspecified: Secondary | ICD-10-CM

## 2018-12-25 DIAGNOSIS — R591 Generalized enlarged lymph nodes: Secondary | ICD-10-CM | POA: Diagnosis not present

## 2018-12-25 LAB — SARS CORONAVIRUS 2 BY RT PCR (HOSPITAL ORDER, PERFORMED IN ~~LOC~~ HOSPITAL LAB): SARS Coronavirus 2: NEGATIVE

## 2018-12-25 NOTE — H&P (View-Only) (Signed)
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 Status post right upper lung wedge resection with lymph node negative stations  Follow-up PET scan shows right hilar adenopathy which is significant for recurrent disease versus new primary  CT scan reviewed with the patient No evidence of shortness of breath No evidence of infection  Patient stop smoking several months ago Positive weight loss No loss of appetite  I explained to the patient that he needs endobronchial ultrasound for definitive diagnosis Plan for immediate procedure in the next 24 to 48 hours   Review of Systems:  Gen:  Denies  fever, sweats, chills weigh loss  HEENT: Denies blurred vision, double vision, ear pain, eye pain, hearing loss, nose bleeds, sore throat Cardiac:  No dizziness, chest pain or heaviness, chest tightness,edema, No JVD Resp:   No cough, -sputum production, -shortness of breath,-wheezing, -hemoptysis,  Gi: Denies swallowing difficulty, stomach pain, nausea or vomiting, diarrhea, constipation, bowel incontinence Gu:  Denies bladder incontinence, burning urine Ext:   Denies Joint pain, stiffness or swelling Skin: Denies  skin rash, easy bruising or bleeding or hives Endoc:  Denies polyuria, polydipsia , polyphagia or weight change Psych:   Denies depression, insomnia or hallucinations  Other:  All other  systems negative    BP (!) 160/100 (BP Location: Left Arm, Cuff Size: Normal)   Pulse 89   Temp (!) 97.2 F (36.2 C) (Temporal)   Ht 6\' 1"  (1.854 m)   Wt 141 lb 12.8 oz (64.3 kg)   SpO2 97%   BMI 18.71 kg/m    Physical Examination:   GENERAL:NAD, no fevers, chills, no weakness no fatigue HEAD: Normocephalic, atraumatic.  EYES: PERLA, EOMI No scleral icterus.  MOUTH: Moist mucosal membrane.  EAR, NOSE, THROAT: Clear without exudates. No external lesions.  NECK: Supple. No thyromegaly.  No JVD.  PULMONARY: CTA B/L no wheezing, rhonchi, crackles CARDIOVASCULAR: S1 and S2. Regular rate and rhythm. No murmurs GASTROINTESTINAL: Soft, nontender, nondistended. Positive bowel sounds.  MUSCULOSKELETAL: No swelling, clubbing, or edema.  NEUROLOGIC: No gross focal neurological deficits. 5/5 strength all extremities SKIN: No ulceration, lesions, rashes, or cyanosis.  PSYCHIATRIC: Insight, judgment intact. -depression -anxiety ALL OTHER ROS ARE NEGATIVE             ASSESSMENT / PLAN:  53 year old pleasant African-American male seen today for follow-up abnormal CT chest initially underwent EVIS FNA which was nondiagnostic then subsequently underwent CT-guided biopsy which was also nondiagnostic and then had a right upper lobe wedge resection for mass consistent with squamous cell carcinoma. Patient had follow-up CT PET scan June 2020 which shows right hilar adenopathy significant for recurrence or new primary cancer  I have reviewed his PET scan with the patient patient will need endobronchial ultrasound for definitive diagnosis   The Risks and Benefits of the Bronchoscopy procedure with EBUS were explained to patient  I have discussed the risk for Acute Bleeding, increased chance of Infection, increased  chance of Respiratory Failure and Cardiac Arrest, increased chance of pneumothorax and collapsed lung, as well as increased Stroke and Death.  I have also explained to avoid all  types of NSAIDs 1 week prior to procedure date  to decrease chance of bleeding, and also to avoid food and drinks the midnight prior to procedure.  The procedure consists of a video camera with a light source to be placed and inserted  into the lungs to  look for abnormal tissue and to obtain tissue samples by using needle and biopsy tools.  The patient understand the risks and benefits and have agreed to proceed with procedure.     COPD seems to be mild at this time Patient quit smoking several months ago No indication for steroids No signs of exacerbation at this time   COVID-19 EDUCATION: The signs and symptoms of COVID-19 were discussed with the patient and how to seek care for testing.  The importance of social distancing was discussed today. Hand Washing Techniques and avoid touching face was advised.  MEDICATION ADJUSTMENTS/LABS AND TESTS ORDERED: PLAN FOR EBUS  CURRENT MEDICATIONS REVIEWED AT LENGTH WITH PATIENT TODAY   Patient/Family are satisfied with Plan of action and management. All questions answered Follow up in    Corrin Parker, M.D.  [facility]  Medical Director [place] Director [facility] Cardio-Pulmonary Department

## 2018-12-25 NOTE — Patient Instructions (Signed)
ADENOPATHY   The Risks and Benefits of the Bronchoscopy procedure with EBUS were explained to patient  I have discussed the risk for Acute Bleeding, increased chance of Infection, increased chance of Respiratory Failure and Cardiac Arrest, increased chance of pneumothorax and collapsed lung, as well as increased Stroke and Death.  I have also explained to avoid all types of NSAIDs 1 week prior to procedure date  to decrease chance of bleeding, and also to avoid food and drinks the midnight prior to procedure.  The procedure consists of a video camera with a light source to be placed and inserted  into the lungs to  look for abnormal tissue and to obtain tissue samples by using needle and biopsy tools.  The patient/family understand the risks and benefits and have agreed to proceed with procedure.   PLAN FOR Thursday June 18th

## 2018-12-25 NOTE — Progress Notes (Signed)
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 Status post right upper lung wedge resection with lymph node negative stations  Follow-up PET scan shows right hilar adenopathy which is significant for recurrent disease versus new primary  CT scan reviewed with the patient No evidence of shortness of breath No evidence of infection  Patient stop smoking several months ago Positive weight loss No loss of appetite  I explained to the patient that he needs endobronchial ultrasound for definitive diagnosis Plan for immediate procedure in the next 24 to 48 hours   Review of Systems:  Gen:  Denies  fever, sweats, chills weigh loss  HEENT: Denies blurred vision, double vision, ear pain, eye pain, hearing loss, nose bleeds, sore throat Cardiac:  No dizziness, chest pain or heaviness, chest tightness,edema, No JVD Resp:   No cough, -sputum production, -shortness of breath,-wheezing, -hemoptysis,  Gi: Denies swallowing difficulty, stomach pain, nausea or vomiting, diarrhea, constipation, bowel incontinence Gu:  Denies bladder incontinence, burning urine Ext:   Denies Joint pain, stiffness or swelling Skin: Denies  skin rash, easy bruising or bleeding or hives Endoc:  Denies polyuria, polydipsia , polyphagia or weight change Psych:   Denies depression, insomnia or hallucinations  Other:  All other  systems negative    BP (!) 160/100 (BP Location: Left Arm, Cuff Size: Normal)   Pulse 89   Temp (!) 97.2 F (36.2 C) (Temporal)   Ht 6\' 1"  (1.854 m)   Wt 141 lb 12.8 oz (64.3 kg)   SpO2 97%   BMI 18.71 kg/m    Physical Examination:   GENERAL:NAD, no fevers, chills, no weakness no fatigue HEAD: Normocephalic, atraumatic.  EYES: PERLA, EOMI No scleral icterus.  MOUTH: Moist mucosal membrane.  EAR, NOSE, THROAT: Clear without exudates. No external lesions.  NECK: Supple. No thyromegaly.  No JVD.  PULMONARY: CTA B/L no wheezing, rhonchi, crackles CARDIOVASCULAR: S1 and S2. Regular rate and rhythm. No murmurs GASTROINTESTINAL: Soft, nontender, nondistended. Positive bowel sounds.  MUSCULOSKELETAL: No swelling, clubbing, or edema.  NEUROLOGIC: No gross focal neurological deficits. 5/5 strength all extremities SKIN: No ulceration, lesions, rashes, or cyanosis.  PSYCHIATRIC: Insight, judgment intact. -depression -anxiety ALL OTHER ROS ARE NEGATIVE             ASSESSMENT / PLAN:  53 year old pleasant African-American male seen today for follow-up abnormal CT chest initially underwent EVIS FNA which was nondiagnostic then subsequently underwent CT-guided biopsy which was also nondiagnostic and then had a right upper lobe wedge resection for mass consistent with squamous cell carcinoma. Patient had follow-up CT PET scan June 2020 which shows right hilar adenopathy significant for recurrence or new primary cancer  I have reviewed his PET scan with the patient patient will need endobronchial ultrasound for definitive diagnosis   The Risks and Benefits of the Bronchoscopy procedure with EBUS were explained to patient  I have discussed the risk for Acute Bleeding, increased chance of Infection, increased  chance of Respiratory Failure and Cardiac Arrest, increased chance of pneumothorax and collapsed lung, as well as increased Stroke and Death.  I have also explained to avoid all  types of NSAIDs 1 week prior to procedure date  to decrease chance of bleeding, and also to avoid food and drinks the midnight prior to procedure.  The procedure consists of a video camera with a light source to be placed and inserted  into the lungs to  look for abnormal tissue and to obtain tissue samples by using needle and biopsy tools.  The patient understand the risks and benefits and have agreed to proceed with procedure.     COPD seems to be mild at this time Patient quit smoking several months ago No indication for steroids No signs of exacerbation at this time   COVID-19 EDUCATION: The signs and symptoms of COVID-19 were discussed with the patient and how to seek care for testing.  The importance of social distancing was discussed today. Hand Washing Techniques and avoid touching face was advised.  MEDICATION ADJUSTMENTS/LABS AND TESTS ORDERED: PLAN FOR EBUS  CURRENT MEDICATIONS REVIEWED AT LENGTH WITH PATIENT TODAY   Patient/Family are satisfied with Plan of action and management. All questions answered Follow up in    Corrin Parker, M.D.  [facility]  Medical Director [place] Director [facility] Cardio-Pulmonary Department

## 2018-12-26 ENCOUNTER — Ambulatory Visit: Payer: BC Managed Care – PPO | Admitting: Anesthesiology

## 2018-12-26 ENCOUNTER — Encounter
Admission: RE | Disposition: A | Discharge status: Home / Self Care | Payer: Self-pay | Source: Home / Self Care | Attending: Internal Medicine

## 2018-12-26 ENCOUNTER — Other Ambulatory Visit: Payer: Self-pay

## 2018-12-26 ENCOUNTER — Ambulatory Visit
Admission: RE | Admit: 2018-12-26 | Discharge: 2018-12-26 | Disposition: A | Discharge status: Home / Self Care | Payer: BC Managed Care – PPO | Attending: Internal Medicine | Admitting: Internal Medicine

## 2018-12-26 ENCOUNTER — Encounter: Payer: Self-pay | Admitting: *Deleted

## 2018-12-26 ENCOUNTER — Ambulatory Visit: Payer: BC Managed Care – PPO

## 2018-12-26 DIAGNOSIS — I10 Essential (primary) hypertension: Secondary | ICD-10-CM | POA: Diagnosis not present

## 2018-12-26 DIAGNOSIS — R590 Localized enlarged lymph nodes: Secondary | ICD-10-CM | POA: Diagnosis present

## 2018-12-26 DIAGNOSIS — R634 Abnormal weight loss: Secondary | ICD-10-CM | POA: Insufficient documentation

## 2018-12-26 DIAGNOSIS — Z85118 Personal history of other malignant neoplasm of bronchus and lung: Secondary | ICD-10-CM | POA: Diagnosis not present

## 2018-12-26 DIAGNOSIS — J449 Chronic obstructive pulmonary disease, unspecified: Secondary | ICD-10-CM | POA: Diagnosis not present

## 2018-12-26 DIAGNOSIS — R591 Generalized enlarged lymph nodes: Secondary | ICD-10-CM | POA: Diagnosis not present

## 2018-12-26 DIAGNOSIS — Z87891 Personal history of nicotine dependence: Secondary | ICD-10-CM | POA: Diagnosis not present

## 2018-12-26 DIAGNOSIS — C771 Secondary and unspecified malignant neoplasm of intrathoracic lymph nodes: Secondary | ICD-10-CM | POA: Insufficient documentation

## 2018-12-26 DIAGNOSIS — Z902 Acquired absence of lung [part of]: Secondary | ICD-10-CM | POA: Diagnosis not present

## 2018-12-26 DIAGNOSIS — Z9889 Other specified postprocedural states: Secondary | ICD-10-CM

## 2018-12-26 HISTORY — PX: ENDOBRONCHIAL ULTRASOUND: SHX5096

## 2018-12-26 LAB — GLUCOSE, CAPILLARY: Glucose-Capillary: 84 mg/dL (ref 70–99)

## 2018-12-26 SURGERY — ENDOBRONCHIAL ULTRASOUND (EBUS)
Anesthesia: General | Laterality: Right

## 2018-12-26 MED ORDER — ROCURONIUM BROMIDE 100 MG/10ML IV SOLN
INTRAVENOUS | Status: DC | PRN
  Administered 2018-12-26: 15 mg via INTRAVENOUS
  Administered 2018-12-26: 5 mg via INTRAVENOUS

## 2018-12-26 MED ORDER — SUCCINYLCHOLINE CHLORIDE 20 MG/ML IJ SOLN
INTRAMUSCULAR | Status: AC
  Filled 2018-12-26: qty 1

## 2018-12-26 MED ORDER — LIDOCAINE HCL (CARDIAC) PF 100 MG/5ML IV SOSY
PREFILLED_SYRINGE | INTRAVENOUS | Status: DC | PRN
  Administered 2018-12-26: 60 mg via INTRAVENOUS

## 2018-12-26 MED ORDER — SUGAMMADEX SODIUM 200 MG/2ML IV SOLN
INTRAVENOUS | Status: DC | PRN
  Administered 2018-12-26: 150 mg via INTRAVENOUS

## 2018-12-26 MED ORDER — SUGAMMADEX SODIUM 200 MG/2ML IV SOLN
INTRAVENOUS | Status: AC
  Filled 2018-12-26: qty 2

## 2018-12-26 MED ORDER — MIDAZOLAM HCL 2 MG/2ML IJ SOLN
INTRAMUSCULAR | Status: AC
  Filled 2018-12-26: qty 2

## 2018-12-26 MED ORDER — OXYCODONE HCL 5 MG/5ML PO SOLN: 5.0000 mg | Freq: Once | ORAL | Status: DC | PRN

## 2018-12-26 MED ORDER — DEXAMETHASONE SODIUM PHOSPHATE 10 MG/ML IJ SOLN
INTRAMUSCULAR | Status: DC | PRN
  Administered 2018-12-26: 4 mg via INTRAVENOUS

## 2018-12-26 MED ORDER — PROPOFOL 10 MG/ML IV BOLUS
INTRAVENOUS | Status: AC
  Filled 2018-12-26: qty 20

## 2018-12-26 MED ORDER — PROPOFOL 10 MG/ML IV BOLUS
INTRAVENOUS | Status: DC | PRN
  Administered 2018-12-26: 130 mg via INTRAVENOUS

## 2018-12-26 MED ORDER — OXYCODONE HCL 5 MG PO TABS: 5.0000 mg | ORAL_TABLET | Freq: Once | ORAL | Status: DC | PRN

## 2018-12-26 MED ORDER — LIDOCAINE HCL (PF) 2 % IJ SOLN
INTRAMUSCULAR | Status: AC
  Filled 2018-12-26: qty 10

## 2018-12-26 MED ORDER — ONDANSETRON HCL 4 MG/2ML IJ SOLN
INTRAMUSCULAR | Status: DC | PRN
  Administered 2018-12-26: 4 mg via INTRAVENOUS

## 2018-12-26 MED ORDER — MIDAZOLAM HCL 5 MG/5ML IJ SOLN
INTRAMUSCULAR | Status: DC | PRN
  Administered 2018-12-26: 1 mg via INTRAVENOUS

## 2018-12-26 MED ORDER — FENTANYL CITRATE (PF) 100 MCG/2ML IJ SOLN
INTRAMUSCULAR | Status: AC
  Filled 2018-12-26: qty 2

## 2018-12-26 MED ORDER — LACTATED RINGERS IV SOLN
INTRAVENOUS | Status: DC
  Administered 2018-12-26: 12:00:00 via INTRAVENOUS

## 2018-12-26 MED ORDER — FENTANYL CITRATE (PF) 100 MCG/2ML IJ SOLN: 25.0000 ug | INTRAMUSCULAR | Status: DC | PRN

## 2018-12-26 MED ORDER — ROCURONIUM BROMIDE 50 MG/5ML IV SOLN
INTRAVENOUS | Status: AC
  Filled 2018-12-26: qty 1

## 2018-12-26 MED ORDER — FENTANYL CITRATE (PF) 100 MCG/2ML IJ SOLN
INTRAMUSCULAR | Status: DC | PRN
  Administered 2018-12-26: 50 ug via INTRAVENOUS

## 2018-12-26 MED ORDER — SUCCINYLCHOLINE CHLORIDE 20 MG/ML IJ SOLN
INTRAMUSCULAR | Status: DC | PRN
  Administered 2018-12-26: 100 mg via INTRAVENOUS

## 2018-12-26 MED ORDER — ONDANSETRON HCL 4 MG/2ML IJ SOLN
INTRAMUSCULAR | Status: AC
  Filled 2018-12-26: qty 2

## 2018-12-26 NOTE — OR Nursing (Signed)
Dr. Mortimer Fries in to see pt, reviewed cxr, advises pt ok to be discharged to home.

## 2018-12-26 NOTE — Interval H&P Note (Signed)
History and Physical Interval Note:  12/26/2018 1:02 PM  Samuel Choi  has presented today for surgery, with the diagnosis of MALIGNANTHILUS OF NEOPLASM OF RIGHT LUNG.  The various methods of treatment have been discussed with the patient and family. After consideration of risks, benefits and other options for treatment, the patient has consented to  Procedure(s): ENDOBRONCHIAL ULTRASOUND RIGHT (Right) as a surgical intervention.  The patient's history has been reviewed, patient examined, no change in status, stable for surgery.  I have reviewed the patient's chart and labs.  Questions were answered to the patient's satisfaction.     [name]

## 2018-12-26 NOTE — OR Nursing (Signed)
Tech in for cxr 1445.

## 2018-12-26 NOTE — Anesthesia Preprocedure Evaluation (Signed)
Anesthesia Evaluation  Patient identified by MRN, date of birth, ID band Patient awake    Reviewed: Allergy & Precautions, H&P , NPO status , Patient's Chart, lab work & pertinent test results  History of Anesthesia Complications Negative for: history of anesthetic complications  Airway Mallampati: III  TM Distance: >3 FB Neck ROM: full    Dental  (+) Chipped, Poor Dentition   Pulmonary shortness of breath and with exertion, COPD, former smoker,           Cardiovascular Exercise Tolerance: Good hypertension, (-) angina(-) Past MI and (-) DOE      Neuro/Psych PSYCHIATRIC DISORDERS negative neurological ROS     GI/Hepatic negative GI ROS, Neg liver ROS,   Endo/Other  negative endocrine ROS  Renal/GU      Musculoskeletal   Abdominal   Peds  Hematology negative hematology ROS (+)   Anesthesia Other Findings Past Medical History: No date: Alcohol abuse     Comment:  usually drinks 2-3 drinks per day 06/2018: Atherosclerosis No date: Chronic sinusitis 06/2018: Emphysema of lung ([place])     Comment:  patient unaware of this. 05/2018: Hip fracture ([place])     Comment:  no surgery 05/2018: History of kidney stones     Comment:  per xray, bilateral nephrolitiasis No date: Hypertension 06/2018: Squamous cell carcinoma of lung, right [facility])  Past Surgical History: 10/2017: BRAIN SURGERY     Comment:  nasal/sinus endoscopy. mass benign 06/25/2018: ELECTROMAGNETIC NAVIGATION BROCHOSCOPY; Right     Comment:  Procedure: ELECTROMAGNETIC NAVIGATION BRONCHOSCOPY;                Surgeon: [name], MD;  Location: [facility] ORS;  Service:              Cardiopulmonary;  Laterality: Right; 06/25/2018: ENDOBRONCHIAL ULTRASOUND; Right     Comment:  Procedure: ENDOBRONCHIAL ULTRASOUND;  Surgeon: [name], MD;  Location: [facility] ORS;  Service:               Cardiopulmonary;  Laterality: Right; 08/20/2018: FLEXIBLE  BRONCHOSCOPY; N/A     Comment:  Procedure: FLEXIBLE BRONCHOSCOPY PREOP;  Surgeon: [name], MD;  Location: [facility] ORS;  Service: Thoracic;                Laterality: N/A; 10/2017: NASAL SINUS SURGERY     Comment:  At [facility], frontal sinusotomy, ethmoidectomy, resection               anterior cranial fossa neoplasm, turbinate resection 08/13/2018: THORACOTOMY; Right     Comment:  Procedure: PREOP BROCHOSCOPY WITH RIGHT THORACOTOMY AND               RUL RESECTION;  Surgeon: [name], MD;  Location:               [facility] ORS;  Service: General;  Laterality: Right; 08/20/2018: THORACOTOMY; Right     Comment:  Procedure: THORACOTOMY MAJOR RIGHT UPPER LOBE LOBECTOMY;              Surgeon: [name], MD;  Location: [facility] ORS;                Service: Thoracic;  Laterality: Right; No date: TOE SURGERY; Left     Comment:  pin in left toe  BMI    Body Mass Index: 18.71 kg/m  Reproductive/Obstetrics negative OB ROS                             Anesthesia Physical Anesthesia Plan  ASA: III  Anesthesia Plan: General ETT   Post-op Pain Management:    Induction: Intravenous  PONV Risk Score and Plan: Ondansetron, Dexamethasone, Midazolam and Treatment may vary due to age or medical condition  Airway Management Planned: Oral ETT  Additional Equipment:   Intra-op Plan:   Post-operative Plan: Extubation in OR  Informed Consent: I have reviewed the patients History and Physical, chart, labs and discussed the procedure including the risks, benefits and alternatives for the proposed anesthesia with the patient or authorized representative who has indicated his/her understanding and acceptance.     Dental Advisory Given  Plan Discussed with: Anesthesiologist, CRNA and Surgeon  Anesthesia Plan Comments: (Patient consented for risks of anesthesia including but not limited to:  - adverse reactions to medications - damage to teeth, lips or  other oral mucosa - sore throat or hoarseness - Damage to heart, brain, lungs or loss of life  Patient voiced understanding.)        Anesthesia Quick Evaluation

## 2018-12-26 NOTE — Transfer of Care (Signed)
Immediate Anesthesia Transfer of Care Note  Patient: [name]  Procedure(s) Performed: ENDOBRONCHIAL ULTRASOUND RIGHT (Right )  Patient Location: PACU  Anesthesia Type:General  Level of Consciousness: awake and patient cooperative  Airway & Oxygen Therapy: Patient Spontanous Breathing and Patient connected to face mask oxygen  Post-op Assessment: Report given to RN and Post -op Vital signs reviewed and stable  Post vital signs: Reviewed and stable  Last Vitals:  Vitals Value Taken Time  BP 157/89 12/26/18 1334  Temp    Pulse 90 12/26/18 1335  Resp 20 12/26/18 1335  SpO2 100 % 12/26/18 1335  Vitals shown include unvalidated device data.  Last Pain:  Vitals:   12/26/18 1215  TempSrc: Temporal  PainSc: 0-No pain         Complications: No apparent anesthesia complications

## 2018-12-26 NOTE — Anesthesia Procedure Notes (Signed)
Procedure Name: Intubation Date/Time: 12/26/2018 1:02 PM Performed by: Dionne Bucy, CRNA Pre-anesthesia Checklist: Patient identified, Patient being monitored, Timeout performed, Emergency Drugs available and Suction available Patient Re-evaluated:Patient Re-evaluated prior to induction Oxygen Delivery Method: Circle system utilized Preoxygenation: Pre-oxygenation with 100% oxygen Induction Type: IV induction Ventilation: Mask ventilation without difficulty Laryngoscope Size: McGraph and 4 Grade View: Grade I Tube type: Oral Tube size: 8.5 mm Number of attempts: 1 Airway Equipment and Method: Stylet and Video-laryngoscopy Placement Confirmation: ETT inserted through vocal cords under direct vision,  positive ETCO2 and breath sounds checked- equal and bilateral Secured at: 25 cm Tube secured with: Tape Dental Injury: Teeth and Oropharynx as per pre-operative assessment

## 2018-12-26 NOTE — Op Note (Signed)
PROCEDURE: ENDOBRONCHIAL ULTRASOUND   PROCEDURE DATE: 12/26/2018  TIME:  NAME:  Samuel Choi  DOB:07/11/1965  MRN: [id number] LOC:  [facility]/None    HOSP DAY: @LENGTHOFSTAYDAYS @ CODE STATUS:   Code Status History    Date Active Date Inactive Code Status Order ID Comments User Context   08/13/2018 1660 08/24/2018 1739 Full Code [id number]  [name], MD Inpatient   Advance Care Planning Activity          Indications/Preliminary Diagnosis:Adenopathy  Consent: (Place X beside choice/s below)  The benefits, risks and possible complications of the procedure were        explained to:  _x__ patient  ___ patient's family  ___ other:___________  who verbalized understanding and gave:  ___ verbal  ___ written  __x_ verbal and written  ___ telephone  ___ other:________ consent.      Unable to obtain consent; procedure performed on emergent basis.     Other:       PRESEDATION ASSESSMENT: History and Physical has been performed. Patient meds and allergies have been reviewed. Presedation airway examination has been performed and documented. Baseline vital signs, sedation score, oxygenation status, and cardiac rhythm were reviewed. Patient was deemed to be in satisfactory condition to undergo the procedure.    PREMEDICATIONS: SEE ANESTHESIOLOGY RECORDS   Insertion Route (Place X beside choice below)   Nasal   Oral  x Endotracheal Tube   Tracheostomy   INTRAPROCEDURE MEDICATIONS: SEE ANESTHESIOLOGY RECORDS   PROCEDURE DETAILS: Timeout performed and correct patient, name, & ID confirmed. Following prep per Pulmonary policy, appropriate sedation was administered.  I proceeded with introducing the endobronchial [place] scope and findings, technical procedures, and specimen collection as noted below. At the end of exam the scope was withdrawn without incident. Impression and Plan as noted below.   SPECIMENS (Sites): (Place X beside choice below)  Specimens Description   No Specimens Obtained      Washings    Lavage    Biopsies   x Fine Needle Aspirates RT HILAR ADENOPATHY/MASS  5 samples obtained   Brushings    Sputum    FINDINGS: RT HILAR ADENOPATHY/MASS approx 3x3 cm  seen under ultrasound  ESTIMATED BLOOD LOSS: none COMPLICATIONS/RESOLUTION: none       IMPRESSION:POST-PROCEDURE DX: ADENOPATHY    RECOMMENDATION/PLAN:  Follow up Pathology Reports     Corrin Parker, M.D.  [facility]  Medical Director [place] Director [place] Department

## 2018-12-26 NOTE — Discharge Instructions (Signed)

## 2018-12-26 NOTE — OR Nursing (Signed)
Discussed pt advising right upper chest "it feels like a pinch"; denies SOB.   Discussed with Dr. Mortimer Fries via tele.  CXR ordered and radiology notified of same.

## 2018-12-26 NOTE — Anesthesia Post-op Follow-up Note (Signed)
Anesthesia QCDR form completed.        

## 2018-12-27 ENCOUNTER — Encounter: Payer: Self-pay | Admitting: Internal Medicine

## 2018-12-27 LAB — CYTOLOGY - NON PAP

## 2018-12-27 NOTE — Anesthesia Postprocedure Evaluation (Signed)
Anesthesia Post Note  Patient: Samuel Choi  Procedure(s) Performed: ENDOBRONCHIAL ULTRASOUND RIGHT (Right )  Patient location during evaluation: PACU Anesthesia Type: General Level of consciousness: awake and alert Pain management: pain level controlled Vital Signs Assessment: post-procedure vital signs reviewed and stable Respiratory status: spontaneous breathing, nonlabored ventilation, respiratory function stable and patient connected to nasal cannula oxygen Cardiovascular status: blood pressure returned to baseline and stable Postop Assessment: no apparent nausea or vomiting Anesthetic complications: no     Last Vitals:  Vitals:   12/26/18 1421 12/26/18 1453  BP: (!) 171/90 (!) 164/91  Pulse: 85 84  Resp: 16 16  Temp:    SpO2: 96% 95%    Last Pain:  Vitals:   12/26/18 1453  TempSrc:   PainSc: 9                  Joseph K Piscitello

## 2019-01-02 ENCOUNTER — Other Ambulatory Visit: Payer: Self-pay

## 2019-01-02 ENCOUNTER — Encounter: Payer: Self-pay | Admitting: Oncology

## 2019-01-02 ENCOUNTER — Inpatient Hospital Stay: Payer: BC Managed Care – PPO

## 2019-01-02 ENCOUNTER — Inpatient Hospital Stay: Payer: BC Managed Care – PPO | Attending: Oncology | Admitting: Oncology

## 2019-01-02 ENCOUNTER — Encounter: Payer: Self-pay | Admitting: *Deleted

## 2019-01-02 VITALS — BP 174/99 | HR 99 | Temp 96.7°F | Wt 140.6 lb

## 2019-01-02 DIAGNOSIS — C3411 Malignant neoplasm of upper lobe, right bronchus or lung: Secondary | ICD-10-CM

## 2019-01-02 DIAGNOSIS — Z7189 Other specified counseling: Secondary | ICD-10-CM | POA: Diagnosis not present

## 2019-01-02 DIAGNOSIS — C3491 Malignant neoplasm of unspecified part of right bronchus or lung: Secondary | ICD-10-CM

## 2019-01-02 LAB — COMPREHENSIVE METABOLIC PANEL
ALT: 16 U/L (ref 0–44)
AST: 24 U/L (ref 15–41)
Albumin: 4.1 g/dL (ref 3.5–5.0)
Alkaline Phosphatase: 97 U/L (ref 38–126)
Anion gap: 13 (ref 5–15)
BUN: 13 mg/dL (ref 6–20)
CO2: 27 mmol/L (ref 22–32)
Calcium: 9.2 mg/dL (ref 8.9–10.3)
Chloride: 105 mmol/L (ref 98–111)
Creatinine, Ser: 0.9 mg/dL (ref 0.61–1.24)
GFR calc Af Amer: >60 mL/min (ref >60)
GFR calc non Af Amer: >60 mL/min (ref >60)
Glucose, Bld: 83 mg/dL (ref 70–99)
Potassium: 4 mmol/L (ref 3.5–5.1)
Sodium: 145 mmol/L (ref 135–145)
Total Bilirubin: 0.7 mg/dL (ref 0.3–1.2)
Total Protein: 7.8 g/dL (ref 6.5–8.1)

## 2019-01-02 LAB — CBC WITH DIFFERENTIAL/PLATELET
Abs Immature Granulocytes: 0.04 10*3/uL (ref 0.00–0.07)
Basophils Absolute: 0.1 10*3/uL (ref 0.0–0.1)
Basophils Relative: 1 %
Eosinophils Absolute: 0.3 10*3/uL (ref 0.0–0.5)
Eosinophils Relative: 2 %
HCT: 44.8 % (ref 39.0–52.0)
Hemoglobin: 15.5 g/dL (ref 13.0–17.0)
Immature Granulocytes: 0 %
Lymphocytes Relative: 16 %
Lymphs Abs: 2.1 10*3/uL (ref 0.7–4.0)
MCH: 35.2 pg — ABNORMAL HIGH (ref 26.0–34.0)
MCHC: 34.6 g/dL (ref 30.0–36.0)
MCV: 101.8 fL — ABNORMAL HIGH (ref 80.0–100.0)
Monocytes Absolute: 0.8 10*3/uL (ref 0.1–1.0)
Monocytes Relative: 6 %
Neutro Abs: 10.1 10*3/uL — ABNORMAL HIGH (ref 1.7–7.7)
Neutrophils Relative %: 75 %
Platelets: 248 10*3/uL (ref 150–400)
RBC: 4.4 MIL/uL (ref 4.22–5.81)
RDW: 13.2 % (ref 11.5–15.5)
WBC: 13.4 10*3/uL — ABNORMAL HIGH (ref 4.0–10.5)
nRBC: 0 % (ref 0.0–0.2)

## 2019-01-02 NOTE — Progress Notes (Signed)
Patient here today for follow up.   

## 2019-01-03 NOTE — Progress Notes (Signed)
  Oncology Nurse Navigator Documentation  Navigator Location: [facility]-Med Onc (01/03/19 0800)   )Navigator Encounter Type: Follow-up Appt (01/03/19 0800)   Abnormal Finding Date: 12/03/18 (01/03/19 0800) Confirmed Diagnosis Date: 12/27/18 (01/03/19 0800)               Patient Visit Type: MedOnc (01/03/19 0800) Treatment Phase: Pre-Tx/Tx Discussion (01/03/19 0800) Barriers/Navigation Needs: Coordination of Care (01/03/19 0800)   Interventions: Coordination of Care (01/03/19 0800)   Coordination of Care: Appts;Radiology (01/03/19 0800)           met with patient during follow up visit with Dr. Tasia Catchings to review biopsy results and discuss treatment options. All questions answered during visit. Reviewed upcoming appts. Instructed pt to call with any further questions or needs. Pt verbalized understanding.       Time Spent with Patient: 45 (01/03/19 0800)

## 2019-01-04 MED ORDER — ONDANSETRON HCL 8 MG PO TABS: 8.0000 mg | ORAL_TABLET | Freq: Two times a day (BID) | ORAL | 1 refills | Status: DC | PRN

## 2019-01-04 MED ORDER — PROCHLORPERAZINE MALEATE 10 MG PO TABS: 10.0000 mg | ORAL_TABLET | Freq: Four times a day (QID) | ORAL | 1 refills | Status: DC | PRN

## 2019-01-04 MED ORDER — LIDOCAINE-PRILOCAINE 2.5-2.5 % EX CREA: TOPICAL_CREAM | CUTANEOUS | 3 refills | Status: DC

## 2019-01-04 NOTE — Progress Notes (Addendum)
Hematology/Oncology Follow up note [facility] [phone] [phone]   Patient Care Team: Tracie Harrier, MD as PCP - General (Internal Medicine) [name], RN as Registered Nurse  REASON FOR VISIT:  Follow up for treatment of squamous lung cancer.   HISTORY OF PRESENTING ILLNESS:  # Dec 2019 Stage I squamous lung cancer #s/p Bronchoscopy on 06/25/2018. subcarina EBUS FNA was non diagnostic, hypocellular specimen.  # 08/13/2018 CT guided right upper lobe biopsy pathology showed dense fibrosis and mixed inflammatory cells with prominent polytypic plasma cells component. Focal benign bronchial wall and alveolar spaces. No malignancy was identified.   # 08/13/2018 underwent right thoracotomy and wedge resection of a right upper lobe mass.  Frozen section was consistent with an inflammatory process.  On the second postop day, preliminary pathology reports high-grade malignancy.  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  08/20/2018 Patient was therefore taken back to the OR and underwent take complete lobectomy  pT1b pN0 cM0 stage I squamous cell lung cancer.  Margin is negative.  Recommend observation.    # 12/03/2018 CT chest w contrast showed local recurrence.  12/10/2018 PET showed No evidence of distant metastatic disease  INTERVAL HISTORY Samuel Choi is a 53 y.o. male who has above history reviewed by me today presents for follow-up management of recurrent  squamous cell lung cancer. # 12/26/2018 s/p bronchoscopy biopsy. Confirmed local recurrence of squamous cell lung cancer.  Today he reports no new complaints. Here to discuss biopsy results and management plan.    Denies cough, hemoptysis, shortness of breath.  Review of Systems  Constitutional: Negative for appetite change, chills, fatigue, fever and unexpected weight change.  HENT:   Negative for hearing loss and voice change.   Eyes: Negative for eye problems and icterus.    Respiratory: Negative for chest tightness, cough and shortness of breath.   Cardiovascular: Negative for chest pain and leg swelling.  Gastrointestinal: Negative for abdominal distention and abdominal pain.  Endocrine: Negative for hot flashes.  Genitourinary: Negative for difficulty urinating, dysuria and frequency.   Musculoskeletal: Negative for arthralgias.  Skin: Negative for itching and rash.  Neurological: Negative for light-headedness and numbness.  Hematological: Negative for adenopathy. Does not bruise/bleed easily.  Psychiatric/Behavioral: Negative for confusion.    MEDICAL HISTORY:  Past Medical History:  Diagnosis Date   Alcohol abuse    usually drinks 2-3 drinks per day   Atherosclerosis 06/2018   Chronic sinusitis    Emphysema of lung ([place]) 06/2018   patient unaware of this.   Hip fracture ([place]) 05/2018   no surgery   History of kidney stones 05/2018   per xray, bilateral nephrolitiasis   Hypertension    Squamous cell carcinoma of lung, right ([place]) 06/2018    SURGICAL HISTORY: Past Surgical History:  Procedure Laterality Date   BRAIN SURGERY  10/2017   nasal/sinus endoscopy. mass benign   ELECTROMAGNETIC NAVIGATION BROCHOSCOPY Right 06/25/2018   Procedure: ELECTROMAGNETIC NAVIGATION BRONCHOSCOPY;  Surgeon: [name], MD;  Location: [facility] ORS;  Service: Cardiopulmonary;  Laterality: Right;   ENDOBRONCHIAL ULTRASOUND Right 06/25/2018   Procedure: ENDOBRONCHIAL ULTRASOUND;  Surgeon: [name], MD;  Location: [facility];  Service: Cardiopulmonary;  Laterality: Right;   ENDOBRONCHIAL ULTRASOUND Right 12/26/2018   Procedure: ENDOBRONCHIAL ULTRASOUND RIGHT;  Surgeon: [name], MD;  Location: [facility];  Service: Cardiopulmonary;  Laterality: Right;   FLEXIBLE BRONCHOSCOPY N/A 08/20/2018   Procedure: FLEXIBLE BRONCHOSCOPY PREOP;  Surgeon: [name], MD;  Location: [facility];  Service: Thoracic;  Laterality: N/A;   NASAL SINUS SURGERY  10/2017    At [facility], frontal sinusotomy, ethmoidectomy, resection anterior cranial fossa neoplasm, turbinate resection   THORACOTOMY Right 08/13/2018   Procedure: PREOP BROCHOSCOPY WITH RIGHT THORACOTOMY AND RUL RESECTION;  Surgeon: [name], MD;  Location: [facility];  Service: General;  Laterality: Right;   THORACOTOMY Right 08/20/2018   Procedure: THORACOTOMY MAJOR RIGHT UPPER LOBE LOBECTOMY;  Surgeon: [name], MD;  Location: [facility];  Service: Thoracic;  Laterality: Right;   TOE SURGERY Left    pin in left toe    SOCIAL HISTORY: Social History   Socioeconomic History   Marital status: Married    Spouse name: lisa   Number of children: Not on file   Years of education: Not on file   Highest education level: Not on file  Occupational History   Occupation: welding    Comment: taking time off to resolve issues  Social Designer, fashion/clothing strain: Not on file   Food insecurity    Worry: Not on file    Inability: Not on file   Transportation needs    Medical: Not on file    Non-medical: Not on file  Tobacco Use   Smoking status: Former Smoker    Packs/day: 0.50    Years: 15.00    Pack years: 7.50    Types: Cigarettes    Quit date: 08/2018    Years since quitting: 0.4   Smokeless tobacco: Never Used   Tobacco comment: attempting to cut back.Not really interested in quitting  Substance and Sexual Activity   Alcohol use: Yes    Alcohol/week: 3.0 standard drinks    Types: 3 Cans of beer per week    Comment: usually 2 drinks per week, per patient   Drug use: No   Sexual activity: Not on file  Lifestyle   Physical activity    Days per week: Not on file    Minutes per session: Not on file   Stress: Not on file  Relationships   Social connections    Talks on phone: Not on file    Gets together: Not on file    Attends religious service: Not on file    Active member of club or organization: Not on file    Attends meetings of clubs or organizations: Not  on file    Relationship status: Not on file   Intimate partner violence    Fear of current or ex partner: Not on file    Emotionally abused: Not on file    Physically abused: Not on file    Forced sexual activity: Not on file  Other Topics Concern   Not on file  Social History Narrative   Not on file    FAMILY HISTORY: Family History  Problem Relation Age of Onset   Breast cancer Mother    Diabetes Mother    Lung cancer Father    Hypertension Father     ALLERGIES:  has No Known Allergies.  MEDICATIONS:  Current Outpatient Medications  Medication Sig Dispense Refill   amLODipine (NORVASC) 10 MG tablet Take 10 mg by mouth daily.      cloNIDine (CATAPRES) 0.1 MG tablet Take 0.1 mg by mouth daily.   5   hydrALAZINE (APRESOLINE) 100 MG tablet Take 50 mg by mouth 2 (two) times daily.      hydrochlorothiazide (HYDRODIURIL) 12.5 MG tablet Take 12.5 mg by mouth daily.      methocarbamol (  ROBAXIN) 500 MG tablet Take 1 tablet (500 mg total) by mouth every 6 (six) hours. 30 tablet 0   olmesartan (BENICAR) 40 MG tablet Take 40 mg by mouth daily.      oxyCODONE-acetaminophen (PERCOCET/ROXICET) 5-325 MG tablet Take 1-2 tablets by mouth every 6 (six) hours as needed for severe pain. 20 tablet 0   potassium chloride SA (K-DUR,KLOR-CON) 20 MEQ tablet Take 20 mEq by mouth 2 (two) times daily.     traMADol (ULTRAM) 50 MG tablet Take 2 tablets (100 mg total) by mouth every 6 (six) hours. 30 tablet 0   No current facility-administered medications for this visit.    Facility-Administered Medications Ordered in Other Visits  Medication Dose Route Frequency Provider Last Rate Last Dose   albuterol (PROVENTIL) (2.5 MG/3ML) 0.083% nebulizer solution 2.5 mg  2.5 mg Nebulization Once System, Provider Not In         PHYSICAL EXAMINATION: ECOG PERFORMANCE STATUS: 0 - Asymptomatic Vitals:   01/02/19 1445  BP: (!) 174/99  Pulse: 99  Temp: (!) 96.7 F (35.9 C)   Filed Weights    01/02/19 1445  Weight: 140 lb 9 oz (63.8 kg)    Physical Exam Constitutional:      General: He is not in acute distress.    Comments: Thin  HENT:     Head: Normocephalic and atraumatic.  Eyes:     General: No scleral icterus.    Pupils: Pupils are equal, round, and reactive to light.  Neck:     Musculoskeletal: Normal range of motion and neck supple.  Cardiovascular:     Rate and Rhythm: Normal rate and regular rhythm.     Heart sounds: Normal heart sounds.  Pulmonary:     Effort: Pulmonary effort is normal. No respiratory distress.     Breath sounds: Normal breath sounds. No wheezing.  Abdominal:     General: Bowel sounds are normal. There is no distension.     Palpations: Abdomen is soft. There is no mass.     Tenderness: There is no abdominal tenderness.  Musculoskeletal: Normal range of motion.        General: No deformity.  Skin:    General: Skin is warm and dry.     Findings: No erythema or rash.  Neurological:     Mental Status: He is alert and oriented to person, place, and time.     Cranial Nerves: No cranial nerve deficit.     Coordination: Coordination normal.  Psychiatric:        Behavior: Behavior normal.        Thought Content: Thought content normal.      LABORATORY DATA:  I have reviewed the data as listed Lab Results  Component Value Date   WBC 13.4 (H) 01/02/2019   HGB 15.5 01/02/2019   HCT 44.8 01/02/2019   MCV 101.8 (H) 01/02/2019   PLT 248 01/02/2019   Recent Labs    08/24/18 0901 08/27/18 0715 12/03/18 0929 01/02/19 1500  NA 128* 133*  --  145  K 4.2 4.4  --  4.0  CL 91* 97*  --  105  CO2 28 29  --  27  GLUCOSE 158* 107*  --  83  BUN 20 16  --  13  CREATININE 0.88 1.07 0.80 0.90  CALCIUM 9.3 9.7  --  9.2  GFRNONAA >60 >60  --  >60  GFRAA >60 >60  --  >60  PROT 7.6 8.1  --  7.8  ALBUMIN 3.0* 3.6  --  4.1  AST 50* 39  --  24  ALT 42 57*  --  16  ALKPHOS 103 108  --  97  BILITOT 0.9 0.6  --  0.7   Iron/TIBC/Ferritin/  %Sat No results found for: IRON, TIBC, FERRITIN, IRONPCTSAT   Pathology 12/26/2018 Right hilar adenopathy/mass-EUS guided FNA Positive for malignancy, non small cell carcinoma, morphologically similar to previous right upper lobe squamous cell carcinoma.   ASSESSMENT & PLAN:  1. Squamous cell carcinoma of lung, right ([place])   2. Goals of care, counseling/discussion    Pathology of EUS guided FNA was discussed with patient.  He has local recurrence.  I have discussed with Dr.Oaks, not surgical resectable.  Recommend obtain MRI brain.  Recommend concurrent chemotherapy with Radiation.  Refer to establish care with RadOnc.   I explained to the patient the risks and benefits of chemotherapy Carboplatin and Taxol,  including all but not limited to infusion reaction, hair loss, hearing loss, mouth sore, nausea, vomiting, low blood counts, bleeding, heart failure, kidney failure and risk of life threatening infection and even death, secondary malignancy etc.  Immunotherapy maintenance will be considered   I discussed the mechanism of action and rationale of using immunotherapy Durvalumab.  Discussed the potential side effects of immunotherapy including but not limited to diarrhea; skin rash; respiratory failure, neurotoxicity, elevated LFTs/endocrine abnormalities etc. Patient voices understanding and willing to proceed chemotherapy.   # Chemotherapy education; port placement. Hopefully the planned start chemotherapy when he is ready to start Radiation. Marland Kitchen Antiemetics-Zofran and Compazine; EMLA cream sent to pharmacy   We spent sufficient time to discuss many aspect of care, questions were answered to patient's satisfaction.   Orders Placed This Encounter  Procedures   MR Brain W Wo Contrast    Standing Status:   Future    Standing Expiration Date:   01/02/2020    Order Specific Question:   ** REASON FOR EXAM (FREE TEXT)    Answer:   Lung cancer    Order Specific Question:   If indicated  for the ordered procedure, I authorize the administration of contrast media per Radiology protocol    Answer:   Yes    Order Specific Question:   What is the patient's sedation requirement?    Answer:   No Sedation    Order Specific Question:   Does the patient have a pacemaker or implanted devices?    Answer:   No    Order Specific Question:   Use SRS Protocol?    Answer:   No    Order Specific Question:   Radiology Contrast Protocol - do NOT remove file path    Answer:   [email or web address]    Order Specific Question:   Preferred imaging location?    Answer:   [facility] (table limit-400lbs)   CBC with Differential/Platelet    Standing Status:   Future    Number of Occurrences:   1    Standing Expiration Date:   01/02/2020   Comprehensive metabolic panel    Standing Status:   Future    Number of Occurrences:   1    Standing Expiration Date:   01/02/2020   Ambulatory referral to Radiation Oncology    Referral Priority:   Routine    Referral Type:   Consultation    Referral Reason:   Specialty Services Required    Referred to Provider:   Noreene Filbert, MD    Requested Specialty:  Radiation Oncology    Number of Visits Requested:   1    All questions were answered. The patient knows to call the clinic with any problems questions or concerns.  Return of visit: To be determined. We spent sufficient time to discuss many aspect of care, questions were answered to patient's satisfaction. Total face to face encounter time for this patient visit was 25 min. >50% of the time was  spent in counseling and coordination of care.      Earlie Server, MD, PhD Hematology Oncology [facility] at [facility] Pager- [phone] 01/04/2019

## 2019-01-04 NOTE — Progress Notes (Signed)
START OFF PATHWAY REGIMEN - Non-Small Cell Lung   [id number]:Carboplatin AUC=2 + Paclitaxel 45 mg/m2 Weekly:   Administer weekly:     Paclitaxel      Carboplatin   **Always confirm dose/schedule in your pharmacy ordering system**  Patient Characteristics: Local Recurrence AJCC T Category: T1 Current Disease Status: Local Recurrence AJCC N Category: N0 AJCC M Category: M0 AJCC 8 Stage Grouping: Unknown Intent of Therapy: Curative Intent, Discussed with Patient

## 2019-01-06 ENCOUNTER — Other Ambulatory Visit: Payer: Self-pay

## 2019-01-06 ENCOUNTER — Encounter: Payer: Self-pay | Admitting: *Deleted

## 2019-01-06 ENCOUNTER — Encounter: Payer: Self-pay | Admitting: Radiation Oncology

## 2019-01-06 ENCOUNTER — Ambulatory Visit
Admission: RE | Admit: 2019-01-06 | Discharge: 2019-01-06 | Disposition: A | Discharge status: Home / Self Care | Payer: BC Managed Care – PPO | Source: Ambulatory Visit | Attending: Radiation Oncology | Admitting: Radiation Oncology

## 2019-01-06 VITALS — BP 163/105 | HR 106 | Temp 97.6°F | Resp 16 | Ht 73.0 in | Wt 140.4 lb

## 2019-01-06 DIAGNOSIS — Z79899 Other long term (current) drug therapy: Secondary | ICD-10-CM | POA: Insufficient documentation

## 2019-01-06 DIAGNOSIS — Z87891 Personal history of nicotine dependence: Secondary | ICD-10-CM | POA: Insufficient documentation

## 2019-01-06 DIAGNOSIS — Z803 Family history of malignant neoplasm of breast: Secondary | ICD-10-CM | POA: Insufficient documentation

## 2019-01-06 DIAGNOSIS — J439 Emphysema, unspecified: Secondary | ICD-10-CM | POA: Insufficient documentation

## 2019-01-06 DIAGNOSIS — Z801 Family history of malignant neoplasm of trachea, bronchus and lung: Secondary | ICD-10-CM | POA: Insufficient documentation

## 2019-01-06 DIAGNOSIS — Z87442 Personal history of urinary calculi: Secondary | ICD-10-CM | POA: Insufficient documentation

## 2019-01-06 DIAGNOSIS — Z923 Personal history of irradiation: Secondary | ICD-10-CM | POA: Insufficient documentation

## 2019-01-06 DIAGNOSIS — F1010 Alcohol abuse, uncomplicated: Secondary | ICD-10-CM | POA: Diagnosis not present

## 2019-01-06 DIAGNOSIS — C3411 Malignant neoplasm of upper lobe, right bronchus or lung: Secondary | ICD-10-CM | POA: Insufficient documentation

## 2019-01-06 DIAGNOSIS — I2510 Atherosclerotic heart disease of native coronary artery without angina pectoris: Secondary | ICD-10-CM | POA: Diagnosis not present

## 2019-01-06 DIAGNOSIS — I10 Essential (primary) hypertension: Secondary | ICD-10-CM | POA: Diagnosis not present

## 2019-01-06 DIAGNOSIS — C3491 Malignant neoplasm of unspecified part of right bronchus or lung: Secondary | ICD-10-CM

## 2019-01-06 NOTE — Consult Note (Signed)
NEW PATIENT EVALUATION  Name: Samuel Choi  MRN: [id number]  Date:   01/06/2019     DOB: Nov 26, 1965   This 53 y.o. male patient presents to the clinic for initial evaluation of recurrent squamous cell carcinoma of the right lung status post right upper lobectomy in February 2020.  REFERRING PHYSICIAN: Tracie Harrier, MD  CHIEF COMPLAINT:  Chief Complaint  Patient presents with  . Lung Cancer    Initial consultation    DIAGNOSIS: The encounter diagnosis was Squamous cell carcinoma of lung, right ([place]).   PREVIOUS INVESTIGATIONS:  CT scans and PET CT scans reviewed Pathology report reviewed Clinical notes reviewed  HPI: Patient is a 53 year old male initially presented back in December 2019 with a stage I squamous cell carcinoma.  He had a CT-guided biopsy in February 2020 showing inflammatory cells with no definitive malignancy.  On February 4 he underwent a right thoracotomy and wedge resection of the right upper lobe showing squamous cell carcinoma.  He was taken back to the operating room and underwent a right upper lobectomy for a T1b N0 M0 stage I squamous cell carcinoma with margins negative.  Multiple lymph nodes were sampled all negative for metastatic disease.  He is being followed although in May 2020 CT scan of the chest showed right hilar prominence consistent with local recurrence.  This was confirmed to be hypermetabolic on CT scan of the right suprahilar region with no evidence of mediastinal or distant metastatic disease.  Patient is fairly asymptomatic specifically denies cough hemoptysis or chest tightness.  He is now referred to radiation oncology for consideration of treatment.  PLANNED TREATMENT REGIMEN: Concurrent chemoradiation  PAST MEDICAL HISTORY:  has a past medical history of Alcohol abuse, Atherosclerosis (06/2018), Chronic sinusitis, Emphysema of lung ([place]) (06/2018), Hip fracture ([place]) (05/2018), History of kidney stones (05/2018), Hypertension, and  Squamous cell carcinoma of lung, right ([place]) (06/2018).    PAST SURGICAL HISTORY:  Past Surgical History:  Procedure Laterality Date  . BRAIN SURGERY  10/2017   nasal/sinus endoscopy. mass benign  . ELECTROMAGNETIC NAVIGATION BROCHOSCOPY Right 06/25/2018   Procedure: ELECTROMAGNETIC NAVIGATION BRONCHOSCOPY;  Surgeon: [name], MD;  Location: [facility];  Service: Cardiopulmonary;  Laterality: Right;  . ENDOBRONCHIAL ULTRASOUND Right 06/25/2018   Procedure: ENDOBRONCHIAL ULTRASOUND;  Surgeon: [name], MD;  Location: [facility];  Service: Cardiopulmonary;  Laterality: Right;  . ENDOBRONCHIAL ULTRASOUND Right 12/26/2018   Procedure: ENDOBRONCHIAL ULTRASOUND RIGHT;  Surgeon: [name], MD;  Location: [facility];  Service: Cardiopulmonary;  Laterality: Right;  . FLEXIBLE BRONCHOSCOPY N/A 08/20/2018   Procedure: FLEXIBLE BRONCHOSCOPY PREOP;  Surgeon: [name], MD;  Location: [facility];  Service: Thoracic;  Laterality: N/A;  . NASAL SINUS SURGERY  10/2017   At [facility], frontal sinusotomy, ethmoidectomy, resection anterior cranial fossa neoplasm, turbinate resection  . THORACOTOMY Right 08/13/2018   Procedure: PREOP BROCHOSCOPY WITH RIGHT THORACOTOMY AND RUL RESECTION;  Surgeon: [name], MD;  Location: [facility];  Service: General;  Laterality: Right;  . THORACOTOMY Right 08/20/2018   Procedure: THORACOTOMY MAJOR RIGHT UPPER LOBE LOBECTOMY;  Surgeon: [name], MD;  Location: [facility];  Service: Thoracic;  Laterality: Right;  . TOE SURGERY Left    pin in left toe    FAMILY HISTORY: family history includes Breast cancer in his mother; Diabetes in his mother; Hypertension in his father; Lung cancer in his father.  SOCIAL HISTORY:  reports that he quit smoking about 4 months ago. His smoking use included cigarettes. He has a 7.50  pack-year smoking history. He has never used smokeless tobacco. He reports current alcohol use of about 3.0 standard drinks of alcohol per week. He reports that he  does not use drugs.  ALLERGIES: Patient has no known allergies.  MEDICATIONS:  Current Outpatient Medications  Medication Sig Dispense Refill  . amLODipine (NORVASC) 10 MG tablet Take 10 mg by mouth daily.     . cloNIDine (CATAPRES) 0.1 MG tablet Take 0.1 mg by mouth daily.   5  . hydrALAZINE (APRESOLINE) 100 MG tablet Take 50 mg by mouth 2 (two) times daily.     . hydrochlorothiazide (HYDRODIURIL) 12.5 MG tablet Take 12.5 mg by mouth daily.     Marland Kitchen lidocaine-prilocaine (EMLA) cream Apply to affected area once 30 g 3  . methocarbamol (ROBAXIN) 500 MG tablet Take 1 tablet (500 mg total) by mouth every 6 (six) hours. 30 tablet 0  . olmesartan (BENICAR) 40 MG tablet Take 40 mg by mouth daily.     . ondansetron (ZOFRAN) 8 MG tablet Take 1 tablet (8 mg total) by mouth 2 (two) times daily as needed for refractory nausea / vomiting. Start on day 3 after chemo. 30 tablet 1  . oxyCODONE-acetaminophen (PERCOCET/ROXICET) 5-325 MG tablet Take 1-2 tablets by mouth every 6 (six) hours as needed for severe pain. 20 tablet 0  . potassium chloride SA (K-DUR,KLOR-CON) 20 MEQ tablet Take 20 mEq by mouth 2 (two) times daily.    . prochlorperazine (COMPAZINE) 10 MG tablet Take 1 tablet (10 mg total) by mouth every 6 (six) hours as needed (Nausea or vomiting). 30 tablet 1  . traMADol (ULTRAM) 50 MG tablet Take 2 tablets (100 mg total) by mouth every 6 (six) hours. 30 tablet 0   No current facility-administered medications for this encounter.    Facility-Administered Medications Ordered in Other Encounters  Medication Dose Route Frequency Provider Last Rate Last Dose  . albuterol (PROVENTIL) (2.5 MG/3ML) 0.083% nebulizer solution 2.5 mg  2.5 mg Nebulization Once System, Provider Not In        ECOG PERFORMANCE STATUS:  0 - Asymptomatic  REVIEW OF SYSTEMS: Patient denies any weight loss, fatigue, weakness, fever, chills or night sweats. Patient denies any loss of vision, blurred vision. Patient denies any ringing   of the ears or hearing loss. No irregular heartbeat. Patient denies heart murmur or history of fainting. Patient denies any chest pain or pain radiating to her upper extremities. Patient denies any shortness of breath, difficulty breathing at night, cough or hemoptysis. Patient denies any swelling in the lower legs. Patient denies any nausea vomiting, vomiting of blood, or coffee ground material in the vomitus. Patient denies any stomach pain. Patient states has had normal bowel movements no significant constipation or diarrhea. Patient denies any dysuria, hematuria or significant nocturia. Patient denies any problems walking, swelling in the joints or loss of balance. Patient denies any skin changes, loss of hair or loss of weight. Patient denies any excessive worrying or anxiety or significant depression. Patient denies any problems with insomnia. Patient denies excessive thirst, polyuria, polydipsia. Patient denies any swollen glands, patient denies easy bruising or easy bleeding. Patient denies any recent infections, allergies or URI. Patient "s visual fields have not changed significantly in recent time.   PHYSICAL EXAM: BP (!) 163/105 (BP Location: Left Arm)   Pulse (!) 106   Temp 97.6 F (36.4 C) (Tympanic)   Resp 16   Ht _0  (1.854 m)   Wt 140 lb 6.9 oz (63.7 kg)  BMI 18.53 kg/m  Well-developed well-nourished patient in NAD. HEENT reveals PERLA, EOMI, discs not visualized.  Oral cavity is clear. No oral mucosal lesions are identified. Neck is clear without evidence of cervical or supraclavicular adenopathy. Lungs are clear to A&P. Cardiac examination is essentially unremarkable with regular rate and rhythm without murmur rub or thrill. Abdomen is benign with no organomegaly or masses noted. Motor sensory and DTR levels are equal and symmetric in the upper and lower extremities. Cranial nerves II through XII are grossly intact. Proprioception is intact. No peripheral adenopathy or edema is  identified. No motor or sensory levels are noted. Crude visual fields are within normal range.  LABORATORY DATA: Pathology report reviewed    RADIOLOGY RESULTS: CT scans and PET CT scans reviewed   IMPRESSION: Recurrent squamous cell carcinoma the right suprahilar region and patient with previous right upper lobectomy for squamous cell carcinoma in 53 year old male  PLAN: At this time I would recommend concurrent chemoradiation therapy with curative intent.  Would plan on delivering 7000 cGy in 35 fractions using 3-dimensional treatment planning.  Also use PET fusion study for treatment planning purposes.  Risks and benefits of treatment including possible radiation esophagitis fatigue alteration blood count skin reaction all were discussed in detail with the patient.  There will be extra effort by both professional staff as well as technical staff to coordinate and manage concurrent chemoradiation and ensuing side effects during his treatments. I have personally set up and ordered CT simulation for later this week.  Nurse navigator was present throughout my consultation and discussion.  Patient seems to comprehend my treatment plan well.  I would like to take this opportunity to thank you for allowing me to participate in the care of your patient.Noreene Filbert, MD

## 2019-01-06 NOTE — Progress Notes (Signed)
START OFF PATHWAY REGIMEN - Non-Small Cell Lung   OFF11010:Durvalumab 10 mg/kg q14 Days:   A cycle is every 14 days:     Durvalumab   **Always confirm dose/schedule in your pharmacy ordering system**  Patient Characteristics: Local Recurrence AJCC T Category: T1 Current Disease Status: Local Recurrence AJCC N Category: N0 AJCC M Category: M0 AJCC 8 Stage Grouping: Unknown Intent of Therapy: Curative Intent, Discussed with Patient

## 2019-01-06 NOTE — Progress Notes (Signed)
  Oncology Nurse Navigator Documentation  Navigator Location: [facility] (01/06/19 1100)   )Navigator Encounter Type: Initial RadOnc (01/06/19 1100)                         Barriers/Navigation Needs: Coordination of Care (01/06/19 1100)   Interventions: Coordination of Care (01/06/19 1100)   Coordination of Care: Appts;Chemo (01/06/19 1100)         met with patient during initial rad-onc consultation with Dr. Baruch Gouty to discuss concurrent chemo/radiation. All questions answered during visit. Informed pt that will get him scheduled for chemo education this week. Also, will get him an appt with Dr. Genevive Bi office for port placement. Reviewed upcoming appts. Instructed pt to call with any further questions or needs. Pt verbalized understanding.          Time Spent with Patient: 30 (01/06/19 1100)

## 2019-01-07 NOTE — Progress Notes (Signed)
Entered in error

## 2019-01-08 ENCOUNTER — Ambulatory Visit
Admission: RE | Admit: 2019-01-08 | Discharge: 2019-01-08 | Disposition: A | Discharge status: Home / Self Care | Payer: BC Managed Care – PPO | Source: Ambulatory Visit | Attending: Radiation Oncology | Admitting: Radiation Oncology

## 2019-01-08 ENCOUNTER — Other Ambulatory Visit: Payer: Self-pay

## 2019-01-08 DIAGNOSIS — Z87891 Personal history of nicotine dependence: Secondary | ICD-10-CM | POA: Diagnosis not present

## 2019-01-08 DIAGNOSIS — C3411 Malignant neoplasm of upper lobe, right bronchus or lung: Secondary | ICD-10-CM | POA: Insufficient documentation

## 2019-01-08 DIAGNOSIS — Z510 Encounter for antineoplastic radiation therapy: Secondary | ICD-10-CM | POA: Insufficient documentation

## 2019-01-08 NOTE — Patient Instructions (Signed)
Durvalumab injection What is this medicine? DURVALUMAB (dur VAL ue mab) is a monoclonal antibody. It is used to treat urothelial cancer and lung cancer. This medicine may be used for other purposes; ask your health care provider or pharmacist if you have questions. COMMON BRAND NAME(S): IMFINZI What should I tell my health care provider before I take this medicine? They need to know if you have any of these conditions:  diabetes  immune system problems  infection  inflammatory bowel disease  kidney disease  liver disease  lung or breathing disease  lupus  organ transplant  stomach or intestine problems  thyroid disease  an unusual or allergic reaction to durvalumab, other medicines, foods, dyes, or preservatives  pregnant or trying to get pregnant  breast-feeding How should I use this medicine? This medicine is for infusion into a vein. It is given by a health care professional in a hospital or clinic setting. A special MedGuide will be given to you before each treatment. Be sure to read this information carefully each time. Talk to your pediatrician regarding the use of this medicine in children. Special care may be needed. Overdosage: If you think you have taken too much of this medicine contact a poison control center or emergency room at once. NOTE: This medicine is only for you. Do not share this medicine with others. What if I miss a dose? It is important not to miss your dose. Call your doctor or health care professional if you are unable to keep an appointment. What may interact with this medicine? Interactions have not been studied. This list may not describe all possible interactions. Give your health care provider a list of all the medicines, herbs, non-prescription drugs, or dietary supplements you use. Also tell them if you smoke, drink alcohol, or use illegal drugs. Some items may interact with your medicine. What should I watch for while using this  medicine? This drug may make you feel generally unwell. Continue your course of treatment even though you feel ill unless your doctor tells you to stop. You may need blood work done while you are taking this medicine. Do not become pregnant while taking this medicine or for 3 months after stopping it. Women should inform their doctor if they wish to become pregnant or think they might be pregnant. There is a potential for serious side effects to an unborn child. Talk to your health care professional or pharmacist for more information. Do not breast-feed an infant while taking this medicine or for 3 months after stopping it. What side effects may I notice from receiving this medicine? Side effects that you should report to your doctor or health care professional as soon as possible:  allergic reactions like skin rash, itching or hives, swelling of the face, lips, or tongue  black, tarry stools  bloody or watery diarrhea  breathing problems  change in emotions or moods  change in sex drive  changes in vision  chest pain or chest tightness  chills  confusion  cough  facial flushing  fever  headache  signs and symptoms of high blood sugar such as dizziness; dry mouth; dry skin; fruity breath; nausea; stomach pain; increased hunger or thirst; increased urination  signs and symptoms of liver injury like dark yellow or brown urine; general ill feeling or flu-like symptoms; light-colored stools; loss of appetite; nausea; right upper belly pain; unusually weak or tired; yellowing of the eyes or skin  stomach pain  trouble passing urine or change in  the amount of urine  weight gain or weight loss Side effects that usually do not require medical attention (report these to your doctor or health care professional if they continue or are bothersome):  bone pain  constipation  loss of appetite  muscle pain  nausea  swelling of the ankles, feet, hands  tiredness This list  may not describe all possible side effects. Call your doctor for medical advice about side effects. You may report side effects to FDA at 1-800-FDA-1088. Where should I keep my medicine? This drug is given in a hospital or clinic and will not be stored at home. NOTE: This sheet is a summary. It may not cover all possible information. If you have questions about this medicine, talk to your doctor, pharmacist, or health care provider.  2020 Elsevier/Gold Standard (2016-09-05 19:25:04) Paclitaxel injection What is this medicine? PACLITAXEL (PAK li TAX el) is a chemotherapy drug. It targets fast dividing cells, like cancer cells, and causes these cells to die. This medicine is used to treat ovarian cancer, breast cancer, lung cancer, Kaposi's sarcoma, and other cancers. This medicine may be used for other purposes; ask your health care provider or pharmacist if you have questions. COMMON BRAND NAME(S): Onxol, Taxol What should I tell my health care provider before I take this medicine? They need to know if you have any of these conditions:  history of irregular heartbeat  liver disease  low blood counts, like low white cell, platelet, or red cell counts  lung or breathing disease, like asthma  tingling of the fingers or toes, or other nerve disorder  an unusual or allergic reaction to paclitaxel, alcohol, polyoxyethylated castor oil, other chemotherapy, other medicines, foods, dyes, or preservatives  pregnant or trying to get pregnant  breast-feeding How should I use this medicine? This drug is given as an infusion into a vein. It is administered in a hospital or clinic by a specially trained health care professional. Talk to your pediatrician regarding the use of this medicine in children. Special care may be needed. Overdosage: If you think you have taken too much of this medicine contact a poison control center or emergency room at once. NOTE: This medicine is only for you. Do not  share this medicine with others. What if I miss a dose? It is important not to miss your dose. Call your doctor or health care professional if you are unable to keep an appointment. What may interact with this medicine? Do not take this medicine with any of the following medications:  disulfiram  metronidazole This medicine may also interact with the following medications:  antiviral medicines for hepatitis, HIV or AIDS  certain antibiotics like erythromycin and clarithromycin  certain medicines for fungal infections like ketoconazole and itraconazole  certain medicines for seizures like carbamazepine, phenobarbital, phenytoin  gemfibrozil  nefazodone  rifampin  St. John's wort This list may not describe all possible interactions. Give your health care provider a list of all the medicines, herbs, non-prescription drugs, or dietary supplements you use. Also tell them if you smoke, drink alcohol, or use illegal drugs. Some items may interact with your medicine. What should I watch for while using this medicine? Your condition will be monitored carefully while you are receiving this medicine. You will need important blood work done while you are taking this medicine. This medicine can cause serious allergic reactions. To reduce your risk you will need to take other medicine(s) before treatment with this medicine. If you experience allergic reactions  like skin rash, itching or hives, swelling of the face, lips, or tongue, tell your doctor or health care professional right away. In some cases, you may be given additional medicines to help with side effects. Follow all directions for their use. This drug may make you feel generally unwell. This is not uncommon, as chemotherapy can affect healthy cells as well as cancer cells. Report any side effects. Continue your course of treatment even though you feel ill unless your doctor tells you to stop. Call your doctor or health care professional  for advice if you get a fever, chills or sore throat, or other symptoms of a cold or flu. Do not treat yourself. This drug decreases your body's ability to fight infections. Try to avoid being around people who are sick. This medicine may increase your risk to bruise or bleed. Call your doctor or health care professional if you notice any unusual bleeding. Be careful brushing and flossing your teeth or using a toothpick because you may get an infection or bleed more easily. If you have any dental work done, tell your dentist you are receiving this medicine. Avoid taking products that contain aspirin, acetaminophen, ibuprofen, naproxen, or ketoprofen unless instructed by your doctor. These medicines may hide a fever. Do not become pregnant while taking this medicine. Women should inform their doctor if they wish to become pregnant or think they might be pregnant. There is a potential for serious side effects to an unborn child. Talk to your health care professional or pharmacist for more information. Do not breast-feed an infant while taking this medicine. Men are advised not to father a child while receiving this medicine. This product may contain alcohol. Ask your pharmacist or healthcare provider if this medicine contains alcohol. Be sure to tell all healthcare providers you are taking this medicine. Certain medicines, like metronidazole and disulfiram, can cause an unpleasant reaction when taken with alcohol. The reaction includes flushing, headache, nausea, vomiting, sweating, and increased thirst. The reaction can last from 30 minutes to several hours. What side effects may I notice from receiving this medicine? Side effects that you should report to your doctor or health care professional as soon as possible:  allergic reactions like skin rash, itching or hives, swelling of the face, lips, or tongue  breathing problems  changes in vision  fast, irregular heartbeat  high or low blood  pressure  mouth sores  pain, tingling, numbness in the hands or feet  signs of decreased platelets or bleeding - bruising, pinpoint red spots on the skin, black, tarry stools, blood in the urine  signs of decreased red blood cells - unusually weak or tired, feeling faint or lightheaded, falls  signs of infection - fever or chills, cough, sore throat, pain or difficulty passing urine  signs and symptoms of liver injury like dark yellow or brown urine; general ill feeling or flu-like symptoms; light-colored stools; loss of appetite; nausea; right upper belly pain; unusually weak or tired; yellowing of the eyes or skin  swelling of the ankles, feet, hands  unusually slow heartbeat Side effects that usually do not require medical attention (report to your doctor or health care professional if they continue or are bothersome):  diarrhea  hair loss  loss of appetite  muscle or joint pain  nausea, vomiting  pain, redness, or irritation at site where injected  tiredness This list may not describe all possible side effects. Call your doctor for medical advice about side effects. You may report side  effects to FDA at 1-800-FDA-1088. Where should I keep my medicine? This drug is given in a hospital or clinic and will not be stored at home. NOTE: This sheet is a summary. It may not cover all possible information. If you have questions about this medicine, talk to your doctor, pharmacist, or health care provider.  2020 Elsevier/Gold Standard (2017-02-27 13:14:55) Carboplatin injection What is this medicine? CARBOPLATIN (KAR boe pla tin) is a chemotherapy drug. It targets fast dividing cells, like cancer cells, and causes these cells to die. This medicine is used to treat ovarian cancer and many other cancers. This medicine may be used for other purposes; ask your health care provider or pharmacist if you have questions. COMMON BRAND NAME(S): Paraplatin What should I tell my health care  provider before I take this medicine? They need to know if you have any of these conditions:  blood disorders  hearing problems  kidney disease  recent or ongoing radiation therapy  an unusual or allergic reaction to carboplatin, cisplatin, other chemotherapy, other medicines, foods, dyes, or preservatives  pregnant or trying to get pregnant  breast-feeding How should I use this medicine? This drug is usually given as an infusion into a vein. It is administered in a hospital or clinic by a specially trained health care professional. Talk to your pediatrician regarding the use of this medicine in children. Special care may be needed. Overdosage: If you think you have taken too much of this medicine contact a poison control center or emergency room at once. NOTE: This medicine is only for you. Do not share this medicine with others. What if I miss a dose? It is important not to miss a dose. Call your doctor or health care professional if you are unable to keep an appointment. What may interact with this medicine?  medicines for seizures  medicines to increase blood counts like filgrastim, pegfilgrastim, sargramostim  some antibiotics like amikacin, gentamicin, neomycin, streptomycin, tobramycin  vaccines Talk to your doctor or health care professional before taking any of these medicines:  acetaminophen  aspirin  ibuprofen  ketoprofen  naproxen This list may not describe all possible interactions. Give your health care provider a list of all the medicines, herbs, non-prescription drugs, or dietary supplements you use. Also tell them if you smoke, drink alcohol, or use illegal drugs. Some items may interact with your medicine. What should I watch for while using this medicine? Your condition will be monitored carefully while you are receiving this medicine. You will need important blood work done while you are taking this medicine. This drug may make you feel generally  unwell. This is not uncommon, as chemotherapy can affect healthy cells as well as cancer cells. Report any side effects. Continue your course of treatment even though you feel ill unless your doctor tells you to stop. In some cases, you may be given additional medicines to help with side effects. Follow all directions for their use. Call your doctor or health care professional for advice if you get a fever, chills or sore throat, or other symptoms of a cold or flu. Do not treat yourself. This drug decreases your body's ability to fight infections. Try to avoid being around people who are sick. This medicine may increase your risk to bruise or bleed. Call your doctor or health care professional if you notice any unusual bleeding. Be careful brushing and flossing your teeth or using a toothpick because you may get an infection or bleed more easily. If you  have any dental work done, tell your dentist you are receiving this medicine. Avoid taking products that contain aspirin, acetaminophen, ibuprofen, naproxen, or ketoprofen unless instructed by your doctor. These medicines may hide a fever. Do not become pregnant while taking this medicine. Women should inform their doctor if they wish to become pregnant or think they might be pregnant. There is a potential for serious side effects to an unborn child. Talk to your health care professional or pharmacist for more information. Do not breast-feed an infant while taking this medicine. What side effects may I notice from receiving this medicine? Side effects that you should report to your doctor or health care professional as soon as possible:  allergic reactions like skin rash, itching or hives, swelling of the face, lips, or tongue  signs of infection - fever or chills, cough, sore throat, pain or difficulty passing urine  signs of decreased platelets or bleeding - bruising, pinpoint red spots on the skin, black, tarry stools, nosebleeds  signs of  decreased red blood cells - unusually weak or tired, fainting spells, lightheadedness  breathing problems  changes in hearing  changes in vision  chest pain  high blood pressure  low blood counts - This drug may decrease the number of white blood cells, red blood cells and platelets. You may be at increased risk for infections and bleeding.  nausea and vomiting  pain, swelling, redness or irritation at the injection site  pain, tingling, numbness in the hands or feet  problems with balance, talking, walking  trouble passing urine or change in the amount of urine Side effects that usually do not require medical attention (report to your doctor or health care professional if they continue or are bothersome):  hair loss  loss of appetite  metallic taste in the mouth or changes in taste This list may not describe all possible side effects. Call your doctor for medical advice about side effects. You may report side effects to FDA at 1-800-FDA-1088. Where should I keep my medicine? This drug is given in a hospital or clinic and will not be stored at home. NOTE: This sheet is a summary. It may not cover all possible information. If you have questions about this medicine, talk to your doctor, pharmacist, or health care provider.  2020 Elsevier/Gold Standard (2007-10-01 14:38:05)

## 2019-01-09 ENCOUNTER — Other Ambulatory Visit: Payer: BC Managed Care – PPO

## 2019-01-09 ENCOUNTER — Other Ambulatory Visit: Payer: Self-pay

## 2019-01-09 ENCOUNTER — Inpatient Hospital Stay: Payer: BC Managed Care – PPO | Attending: Oncology

## 2019-01-09 DIAGNOSIS — Z5111 Encounter for antineoplastic chemotherapy: Secondary | ICD-10-CM | POA: Insufficient documentation

## 2019-01-09 DIAGNOSIS — C3411 Malignant neoplasm of upper lobe, right bronchus or lung: Secondary | ICD-10-CM | POA: Diagnosis not present

## 2019-01-09 NOTE — Progress Notes (Signed)
Tumor Board Documentation  Samuel Choi was presented by Dr Tasia Catchings at our Tumor Board on 01/09/2019, which included representatives from medical oncology, radiation oncology, surgical, radiology, pathology, navigation, internal medicine, research, pulmonology.  Samuel Choi currently presents as a current patient, for new positive pathology, for [place], for discussion with history of the following treatments: active survellience.  Additionally, we reviewed previous medical and familial history, history of present illness, and recent lab results along with all available histopathologic and imaging studies. The tumor board considered available treatment options and made the following recommendations: Concurrent chemo-radiation therapy    The following procedures/referrals were also placed: No orders of the defined types were placed in this encounter.   Clinical Trial Status: not discussed   Staging used: AJCC Stage Group  AJCC Staging: T: 1b N: 0 M: 0 Group: Stage 1 B  Squamous cell carcinoma of lung   National site-specific guidelines NCCN were discussed with respect to the case.  Tumor board is a meeting of clinicians from various specialty areas who evaluate and discuss patients for whom a multidisciplinary approach is being considered. Final determinations in the plan of care are those of the provider(s). The responsibility for follow up of recommendations given during tumor board is that of the provider.   Today's extended care, comprehensive team conference, Samuel Choi was not present for the discussion and was not examined.   Multidisciplinary Tumor Board is a multidisciplinary case peer review process.  Decisions discussed in the Multidisciplinary Tumor Board reflect the opinions of the specialists present at the conference without having examined the patient.  Ultimately, treatment and diagnostic decisions rest with the primary provider(s) and the patient.

## 2019-01-13 ENCOUNTER — Encounter: Payer: Self-pay | Admitting: Cardiothoracic Surgery

## 2019-01-13 ENCOUNTER — Other Ambulatory Visit
Admission: RE | Admit: 2019-01-13 | Discharge: 2019-01-13 | Disposition: A | Discharge status: Home / Self Care | Payer: BC Managed Care – PPO | Source: Ambulatory Visit | Attending: Cardiothoracic Surgery | Admitting: Cardiothoracic Surgery

## 2019-01-13 ENCOUNTER — Encounter: Payer: Self-pay | Admitting: *Deleted

## 2019-01-13 ENCOUNTER — Ambulatory Visit (INDEPENDENT_AMBULATORY_CARE_PROVIDER_SITE_OTHER): Payer: BC Managed Care – PPO | Admitting: Cardiothoracic Surgery

## 2019-01-13 ENCOUNTER — Other Ambulatory Visit: Payer: Self-pay

## 2019-01-13 VITALS — BP 135/84 | HR 94 | Temp 97.7°F | Resp 16 | Ht 73.0 in | Wt 138.0 lb

## 2019-01-13 DIAGNOSIS — Z01812 Encounter for preprocedural laboratory examination: Secondary | ICD-10-CM | POA: Diagnosis not present

## 2019-01-13 DIAGNOSIS — Z1159 Encounter for screening for other viral diseases: Secondary | ICD-10-CM | POA: Insufficient documentation

## 2019-01-13 DIAGNOSIS — C3401 Malignant neoplasm of right main bronchus: Secondary | ICD-10-CM | POA: Diagnosis not present

## 2019-01-13 NOTE — Progress Notes (Signed)
Patient's surgery to be scheduled for 01-15-19 at [facility] with Dr. Genevive Bi.  The patient is aware to have COVID-19 testing done on 01-13-19 at the [place] building drive thru ([address] [place]) after he leaves his appointment today. He is aware to isolate after, have no visitors, wash hands frequently, and avoid touching face.   The patient is aware he will be contacted by the [facility] to complete a phone interview sometime in the near future.  Patient aware to be NPO after midnight and have a driver.   He is aware to check in at the [facility] entrance where he will be screened for the coronavirus and then sent to Same Day Surgery.   Patient aware that he may have no visitors and driver will need to wait in the car due to COVID-19 restrictions.   The patient verbalizes understanding of the above.   The patient is aware to call the office should he have further questions.

## 2019-01-13 NOTE — H&P (View-Only) (Signed)
  Patient ID: Samuel Choi, male   DOB: 04/17/1966, 53 y.o.   MRN: [id number]  HISTORY: Mr. Boehringer returns today with a new problem.  He has a recurrent lung cancer and he is scheduled to begin chemotherapy later this week.  He is sent here for consideration of Port-A-Cath placement.  He states he has been getting along pretty well.  He is not short of breath.  He has no new complaints.   Vitals:   01/13/19 1351  BP: 135/84  Pulse: 94  Resp: 16  Temp: 97.7 F (36.5 C)  SpO2: 99%     EXAM:    Resp: Lungs are clear bilaterally.  No respiratory distress, normal effort. Heart:  Regular without murmurs Abd:  Abdomen is soft, non distended and non tender. No masses are palpable.  There is no rebound and no guarding.  Neurological: Alert and oriented to person, place, and time. Coordination normal.  Skin: Skin is warm and dry. No rash noted. No diaphoretic. No erythema. No pallor.  His thoracotomy wound is well-healed. Psychiatric: Normal mood and affect. Normal behavior. Judgment and thought content normal.    ASSESSMENT: I discussed with him the indications and risks of Port-A-Cath placement.  Risks of bleeding, infection, pneumothorax and death were all reviewed.   PLAN:   We will schedule surgery for later this week so that he can begin his chemotherapy on Friday.    [name], MD

## 2019-01-13 NOTE — Patient Instructions (Addendum)

## 2019-01-13 NOTE — Progress Notes (Signed)
  Patient ID: Samuel Choi, male   DOB: 1966/03/28, 53 y.o.   MRN: [id number]  HISTORY: [name] returns today with a new problem.  He has a recurrent lung cancer and he is scheduled to begin chemotherapy later this week.  He is sent here for consideration of Port-A-Cath placement.  He states he has been getting along pretty well.  He is not short of breath.  He has no new complaints.   Vitals:   01/13/19 1351  BP: 135/84  Pulse: 94  Resp: 16  Temp: 97.7 F (36.5 C)  SpO2: 99%     EXAM:    Resp: Lungs are clear bilaterally.  No respiratory distress, normal effort. Heart:  Regular without murmurs Abd:  Abdomen is soft, non distended and non tender. No masses are palpable.  There is no rebound and no guarding.  Neurological: Alert and oriented to person, place, and time. Coordination normal.  Skin: Skin is warm and dry. No rash noted. No diaphoretic. No erythema. No pallor.  His thoracotomy wound is well-healed. Psychiatric: Normal mood and affect. Normal behavior. Judgment and thought content normal.    ASSESSMENT: I discussed with him the indications and risks of Port-A-Cath placement.  Risks of bleeding, infection, pneumothorax and death were all reviewed.   PLAN:   We will schedule surgery for later this week so that he can begin his chemotherapy on Friday.    [name], MD

## 2019-01-14 ENCOUNTER — Encounter
Admission: RE | Admit: 2019-01-14 | Discharge: 2019-01-14 | Disposition: A | Discharge status: Home / Self Care | Payer: BC Managed Care – PPO | Source: Ambulatory Visit | Attending: Cardiothoracic Surgery | Admitting: Cardiothoracic Surgery

## 2019-01-14 ENCOUNTER — Other Ambulatory Visit: Payer: Self-pay

## 2019-01-14 ENCOUNTER — Encounter: Payer: Self-pay | Admitting: Anesthesiology

## 2019-01-14 LAB — SARS CORONAVIRUS 2 (TAT 6-24 HRS): SARS Coronavirus 2: NEGATIVE

## 2019-01-14 MED ORDER — CEFAZOLIN SODIUM-DEXTROSE 2-4 GM/100ML-% IV SOLN
2.0000 g | INTRAVENOUS | Status: AC
  Administered 2019-01-15: 2 g via INTRAVENOUS

## 2019-01-14 NOTE — Patient Instructions (Signed)
Your procedure is scheduled on: tomorrow 7/8  Report to Day Surgery. To find out your arrival time please call [phone] between 1PM - 3PM on today.  Remember: Instructions that are not followed completely may result in serious medical risk,  up to and including death, or upon the discretion of your surgeon and anesthesiologist your  surgery may need to be rescheduled.     _X__ 1. Do not eat food after midnight the night before your procedure.                 No gum chewing or hard candies. You may drink clear liquids up to 2 hours                 before you are scheduled to arrive for your surgery- DO not drink clear                 liquids within 2 hours of the start of your surgery.                 Clear Liquids include:  water, apple juice without pulp, clear carbohydrate                 drink such as Clearfast of Gatorade, Black Coffee or Tea (Do not add                 anything to coffee or tea).  __X__2.  On the morning of surgery brush your teeth with toothpaste and water, you                may rinse your mouth with mouthwash if you wish.  Do not swallow any toothpaste of mouthwash.     _X__ 3.  No Alcohol for 24 hours before or after surgery.   ___ 4.  Do Not Smoke or use e-cigarettes For 24 Hours Prior to Your Surgery.                 Do not use any chewable tobacco products for at least 6 hours prior to                 surgery.  ____  5.  Bring all medications with you on the day of surgery if instructed.   _x___  6.  Notify your doctor if there is any change in your medical condition      (cold, fever, infections).     Do not wear jewelry, make-up, hairpins, clips or nail polish. Do not wear lotions, powders, or perfumes. You may wear deodorant. Do not shave 48 hours prior to surgery. Men may shave face and neck. Do not bring valuables to the hospital.    [facility] is not responsible for any belongings or valuables.  Contacts, dentures  or bridgework may not be worn into surgery. Leave your suitcase in the car. After surgery it may be brought to your room. For patients admitted to the hospital, discharge time is determined by your treatment team.   Patients discharged the day of surgery will not be allowed to drive home.   Please read over the following fact sheets that you were given:     __x__ Take these medicines the morning of surgery with A SIP OF WATER:    1. amLODipine (NORVASC) 10 MG tablet  2. cloNIDine (CATAPRES) 0.1 MG tablet   3. hydrALAZINE (APRESOLINE) 100 MG tablet  4.  5.  6.  ____ Fleet Enema (as directed)  __x__ Shower as directed  ____ Use inhalers on the day of surgery  ____ Stop metformin 2 days prior to surgery    ____ Take 1/2 of usual insulin dose the night before surgery. No insulin the morning          of surgery.   ____ Stop Coumadin/Plavix/aspirin on   ____ Stop Anti-inflammatories on    ____ Stop supplements until after surgery.    ____ Bring C-Pap to the hospital.

## 2019-01-14 NOTE — Pre-Procedure Instructions (Signed)
MESSAGED DR OAKS REGARDING CBC AND CMP THAT WAS DONE ON 6-25. DR Genevive Bi ORDERED ANOTHER CBC AND CMP.DR OAKS SAID THIS DOES NOT NEED TO BE REPEATED. WE WILL DO PT/PTT DOS

## 2019-01-15 ENCOUNTER — Ambulatory Visit: Payer: BC Managed Care – PPO | Admitting: Anesthesiology

## 2019-01-15 ENCOUNTER — Ambulatory Visit
Admission: RE | Admit: 2019-01-15 | Discharge: 2019-01-15 | Disposition: A | Discharge status: Home / Self Care | Payer: BC Managed Care – PPO | Source: Home / Self Care | Attending: Cardiothoracic Surgery | Admitting: Cardiothoracic Surgery

## 2019-01-15 ENCOUNTER — Encounter
Admission: RE | Disposition: A | Discharge status: Home / Self Care | Payer: Self-pay | Source: Home / Self Care | Attending: Cardiothoracic Surgery

## 2019-01-15 ENCOUNTER — Ambulatory Visit: Payer: BC Managed Care – PPO

## 2019-01-15 ENCOUNTER — Other Ambulatory Visit: Payer: Self-pay

## 2019-01-15 ENCOUNTER — Other Ambulatory Visit: Payer: Self-pay | Admitting: Oncology

## 2019-01-15 ENCOUNTER — Ambulatory Visit
Admission: RE | Admit: 2019-01-15 | Discharge: 2019-01-15 | Disposition: A | Discharge status: Home / Self Care | Payer: BC Managed Care – PPO | Attending: Cardiothoracic Surgery | Admitting: Cardiothoracic Surgery

## 2019-01-15 DIAGNOSIS — I10 Essential (primary) hypertension: Secondary | ICD-10-CM | POA: Insufficient documentation

## 2019-01-15 DIAGNOSIS — C3491 Malignant neoplasm of unspecified part of right bronchus or lung: Secondary | ICD-10-CM | POA: Insufficient documentation

## 2019-01-15 DIAGNOSIS — Z87891 Personal history of nicotine dependence: Secondary | ICD-10-CM | POA: Diagnosis not present

## 2019-01-15 DIAGNOSIS — C3401 Malignant neoplasm of right main bronchus: Secondary | ICD-10-CM

## 2019-01-15 DIAGNOSIS — F419 Anxiety disorder, unspecified: Secondary | ICD-10-CM | POA: Insufficient documentation

## 2019-01-15 DIAGNOSIS — J449 Chronic obstructive pulmonary disease, unspecified: Secondary | ICD-10-CM | POA: Diagnosis not present

## 2019-01-15 HISTORY — PX: PORTACATH PLACEMENT: SHX2246

## 2019-01-15 LAB — PROTIME-INR
INR: 1 (ref 0.8–1.2)
Prothrombin Time: 13 seconds (ref 11.4–15.2)

## 2019-01-15 LAB — APTT: aPTT: 30 s (ref 24–36)

## 2019-01-15 SURGERY — INSERTION, TUNNELED CENTRAL VENOUS DEVICE, WITH PORT
Anesthesia: General | Site: Chest | Laterality: Left

## 2019-01-15 MED ORDER — PROPOFOL 10 MG/ML IV BOLUS
INTRAVENOUS | Status: AC
  Filled 2019-01-15: qty 20

## 2019-01-15 MED ORDER — LACTATED RINGERS IV SOLN
INTRAVENOUS | Status: DC
  Administered 2019-01-15: 09:00:00 via INTRAVENOUS

## 2019-01-15 MED ORDER — GLYCOPYRROLATE 0.2 MG/ML IJ SOLN
INTRAMUSCULAR | Status: DC | PRN
  Administered 2019-01-15: 0.2 mg via INTRAVENOUS

## 2019-01-15 MED ORDER — CHLORHEXIDINE GLUCONATE CLOTH 2 % EX PADS: 6.0000 | MEDICATED_PAD | Freq: Once | CUTANEOUS | Status: DC

## 2019-01-15 MED ORDER — MIDAZOLAM HCL 2 MG/2ML IJ SOLN
INTRAMUSCULAR | Status: DC | PRN
  Administered 2019-01-15: 2 mg via INTRAVENOUS

## 2019-01-15 MED ORDER — CEFAZOLIN SODIUM-DEXTROSE 2-4 GM/100ML-% IV SOLN
INTRAVENOUS | Status: AC
  Filled 2019-01-15: qty 100

## 2019-01-15 MED ORDER — OXYCODONE-ACETAMINOPHEN 5-325 MG PO TABS: 1.0000 | ORAL_TABLET | ORAL | 0 refills | Status: AC | PRN

## 2019-01-15 MED ORDER — ONDANSETRON HCL 4 MG/2ML IJ SOLN: 4.0000 mg | Freq: Once | INTRAMUSCULAR | Status: DC | PRN

## 2019-01-15 MED ORDER — FAMOTIDINE 20 MG PO TABS: 20.0000 mg | ORAL_TABLET | Freq: Once | ORAL | Status: DC

## 2019-01-15 MED ORDER — MIDAZOLAM HCL 2 MG/2ML IJ SOLN
INTRAMUSCULAR | Status: AC
  Filled 2019-01-15: qty 2

## 2019-01-15 MED ORDER — SODIUM CHLORIDE 0.9 % IR SOLN
Status: DC | PRN
  Administered 2019-01-15: 500 mL

## 2019-01-15 MED ORDER — PROPOFOL 500 MG/50ML IV EMUL
INTRAVENOUS | Status: AC
  Filled 2019-01-15: qty 50

## 2019-01-15 MED ORDER — LIDOCAINE HCL (CARDIAC) PF 100 MG/5ML IV SOSY
PREFILLED_SYRINGE | INTRAVENOUS | Status: DC | PRN
  Administered 2019-01-15: 40 mg via INTRAVENOUS

## 2019-01-15 MED ORDER — LIDOCAINE HCL (PF) 2 % IJ SOLN
INTRAMUSCULAR | Status: AC
  Filled 2019-01-15: qty 10

## 2019-01-15 MED ORDER — PROPOFOL 10 MG/ML IV BOLUS
INTRAVENOUS | Status: DC | PRN
  Administered 2019-01-15: 140 mg via INTRAVENOUS

## 2019-01-15 MED ORDER — FENTANYL CITRATE (PF) 100 MCG/2ML IJ SOLN: 25.0000 ug | INTRAMUSCULAR | Status: DC | PRN

## 2019-01-15 MED ORDER — FENTANYL CITRATE (PF) 100 MCG/2ML IJ SOLN
INTRAMUSCULAR | Status: AC
  Filled 2019-01-15: qty 2

## 2019-01-15 MED ORDER — PHENYLEPHRINE HCL (PRESSORS) 10 MG/ML IV SOLN
INTRAVENOUS | Status: DC | PRN
  Administered 2019-01-15 (×2): 50 ug via INTRAVENOUS
  Administered 2019-01-15: 100 ug via INTRAVENOUS

## 2019-01-15 MED ORDER — HEPARIN SODIUM (PORCINE) 5000 UNIT/ML IJ SOLN
INTRAMUSCULAR | Status: DC | PRN
  Administered 2019-01-15: 5000 [IU]

## 2019-01-15 MED ORDER — LIDOCAINE HCL (PF) 1 % IJ SOLN
INTRAMUSCULAR | Status: DC | PRN
  Administered 2019-01-15: 9 mL

## 2019-01-15 MED ORDER — FENTANYL CITRATE (PF) 100 MCG/2ML IJ SOLN
INTRAMUSCULAR | Status: DC | PRN
  Administered 2019-01-15: 25 ug via INTRAVENOUS

## 2019-01-15 SURGICAL SUPPLY — 50 items
BAG DECANTER FOR FLEXI CONT (MISCELLANEOUS) ×2 IMPLANT
BLADE SURG SZ11 CARB STEEL (BLADE) ×2 IMPLANT
CANISTER SUCT 1200ML W/VALVE (MISCELLANEOUS) ×2 IMPLANT
CHLORAPREP W/TINT 26 (MISCELLANEOUS) ×2 IMPLANT
COVER LIGHT HANDLE STERIS (MISCELLANEOUS) ×4 IMPLANT
DERMABOND ADVANCED (GAUZE/BANDAGES/DRESSINGS) ×1
DERMABOND ADVANCED .7 DNX12 (GAUZE/BANDAGES/DRESSINGS) IMPLANT
DRAPE C-ARM XRAY 36X54 (DRAPES) ×2 IMPLANT
DRAPE INCISE IOBAN 66X45 STRL (DRAPES) ×1 IMPLANT
DRSG TEGADERM 2-3/8X2-3/4 SM (GAUZE/BANDAGES/DRESSINGS) ×2 IMPLANT
DRSG TEGADERM 4X4.75 (GAUZE/BANDAGES/DRESSINGS) ×2 IMPLANT
DRSG TELFA 4X3 1S NADH ST (GAUZE/BANDAGES/DRESSINGS) ×2 IMPLANT
ELECT CAUTERY BLADE TIP 2.5 (TIP) ×2
ELECT REM PT RETURN 9FT ADLT (ELECTROSURGICAL) ×2
ELECTRODE CAUTERY BLDE TIP 2.5 (TIP) ×1 IMPLANT
ELECTRODE REM PT RTRN 9FT ADLT (ELECTROSURGICAL) ×1 IMPLANT
GLOVE SURG SYN 7.5  E (GLOVE) ×1
GLOVE SURG SYN 7.5 E (GLOVE) ×1 IMPLANT
GLOVE SURG SYN 7.5 PF PI (GLOVE) ×1 IMPLANT
GOWN STRL REUS W/ TWL LRG LVL3 (GOWN DISPOSABLE) ×2 IMPLANT
GOWN STRL REUS W/TWL LRG LVL3 (GOWN DISPOSABLE) ×2
IV NS 500ML (IV SOLUTION) ×1
IV NS 500ML BAXH (IV SOLUTION) ×1 IMPLANT
KIT PORT POWER 8FR ISP CVUE (Port) ×2 IMPLANT
KIT TURNOVER KIT A (KITS) ×2 IMPLANT
LABEL OR SOLS (LABEL) ×2 IMPLANT
MARKER SKIN DUAL TIP RULER LAB (MISCELLANEOUS) ×2 IMPLANT
MICROPUNCTURE 5FR NT-U-SST (MISCELLANEOUS) ×2
NDL FILTER BLUNT 18X1 1/2 (NEEDLE) ×1 IMPLANT
NEEDLE FILTER BLUNT 18X 1/2SAF (NEEDLE) ×1
NEEDLE FILTER BLUNT 18X1 1/2 (NEEDLE) ×1 IMPLANT
NEEDLE HYPO 22GX1.5 SAFETY (NEEDLE) ×4 IMPLANT
NS IRRIG 500ML POUR BTL (IV SOLUTION) ×2 IMPLANT
PACK PORT-A-CATH (MISCELLANEOUS) ×2 IMPLANT
PAD TELFA 2X3 NADH STRL (GAUZE/BANDAGES/DRESSINGS) ×1 IMPLANT
SET MICROPUNCTURE 5FR NT-U-SST (MISCELLANEOUS) IMPLANT
STRIP CLOSURE SKIN 1/4X4 (GAUZE/BANDAGES/DRESSINGS) IMPLANT
SUT ETHILON 4-0 (SUTURE) ×2
SUT ETHILON 4-0 FS2 18XMFL BLK (SUTURE) ×2
SUT PROLENE 2 0 SH DA (SUTURE) ×4 IMPLANT
SUT VIC AB 2-0 SH 27 (SUTURE) ×1
SUT VIC AB 2-0 SH 27XBRD (SUTURE) ×1 IMPLANT
SUT VIC AB 3-0 SH 27 (SUTURE) ×1
SUT VIC AB 3-0 SH 27X BRD (SUTURE) ×1 IMPLANT
SUT VIC AB 4-0 FS2 27 (SUTURE) ×1 IMPLANT
SUTURE ETHLN 4-0 FS2 18XMF BLK (SUTURE) ×2 IMPLANT
SYR 10ML SLIP (SYRINGE) ×2 IMPLANT
SYR 3ML LL SCALE MARK (SYRINGE) ×2 IMPLANT
TAPE CLOTH 3X10 WHT NS LF (GAUZE/BANDAGES/DRESSINGS) IMPLANT
TAPE TRANSPORE STRL 2 31045 (GAUZE/BANDAGES/DRESSINGS) ×2 IMPLANT

## 2019-01-15 NOTE — Transfer of Care (Signed)
Immediate Anesthesia Transfer of Care Note  Patient: Samuel Choi  Procedure(s) Performed: INSERTION PORT-A-CATH (Left Chest)  Patient Location: PACU  Anesthesia Type:General  Level of Consciousness: awake, alert  and oriented  Airway & Oxygen Therapy: Patient Spontanous Breathing and Patient connected to face mask oxygen  Post-op Assessment: Report given to RN and Post -op Vital signs reviewed and stable  Post vital signs: Reviewed and stable  Last Vitals:  Vitals Value Taken Time  BP    Temp    Pulse 82 01/15/19 1011  Resp 21 01/15/19 1011  SpO2 100 % 01/15/19 1011  Vitals shown include unvalidated device data.  Last Pain:  Vitals:   01/15/19 0820  TempSrc: Temporal  PainSc: 0-No pain         Complications: No apparent anesthesia complications

## 2019-01-15 NOTE — Anesthesia Procedure Notes (Signed)
Procedure Name: LMA Insertion Date/Time: 01/15/2019 8:59 AM Performed by: Gentry Fitz, CRNA Pre-anesthesia Checklist: Patient identified, Emergency Drugs available, Suction available and Patient being monitored Patient Re-evaluated:Patient Re-evaluated prior to induction Oxygen Delivery Method: Circle system utilized Preoxygenation: Pre-oxygenation with 100% oxygen Induction Type: IV induction Ventilation: Mask ventilation without difficulty LMA: LMA inserted LMA Size: 5.0 Number of attempts: 1 Tube secured with: Tape Dental Injury: Teeth and Oropharynx as per pre-operative assessment

## 2019-01-15 NOTE — Anesthesia Postprocedure Evaluation (Signed)
Anesthesia Post Note  Patient: Samuel Choi  Procedure(s) Performed: INSERTION PORT-A-CATH (Left Chest)  Patient location during evaluation: PACU Anesthesia Type: General Level of consciousness: awake and alert Pain management: pain level controlled Vital Signs Assessment: post-procedure vital signs reviewed and stable Respiratory status: spontaneous breathing, nonlabored ventilation, respiratory function stable and patient connected to nasal cannula oxygen Cardiovascular status: blood pressure returned to baseline and stable Postop Assessment: no apparent nausea or vomiting Anesthetic complications: no     Last Vitals:  Vitals:   01/15/19 1032 01/15/19 1057  BP: 129/78 (!) 131/91  Pulse: 77 73  Resp: 16 16  Temp: (!) 36.1 C   SpO2: 100% 97%    Last Pain:  Vitals:   01/15/19 1057  TempSrc:   PainSc: 0-No pain                 [name]

## 2019-01-15 NOTE — Anesthesia Preprocedure Evaluation (Signed)
Anesthesia Evaluation  Patient identified by MRN, date of birth, ID band Patient awake    Reviewed: Allergy & Precautions, NPO status , Patient's Chart, lab work & pertinent test results, reviewed documented beta blocker date and time   Airway Mallampati: II  TM Distance: >3 FB     Dental  (+) Chipped   Pulmonary COPD, former smoker,           Cardiovascular hypertension, Pt. on medications      Neuro/Psych Anxiety    GI/Hepatic   Endo/Other    Renal/GU      Musculoskeletal   Abdominal   Peds  Hematology   Anesthesia Other Findings Lung ca with thoracotomy. Smokes. ETOH. R pleural effusion. 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  Reproductive/Obstetrics                             Anesthesia Physical Anesthesia Plan  ASA: III  Anesthesia Plan: General   Post-op Pain Management:    Induction: Intravenous  PONV Risk Score and Plan:   Airway Management Planned: LMA  Additional Equipment:   Intra-op Plan:   Post-operative Plan:   Informed Consent: I have reviewed the patients History and Physical, chart, labs and discussed the procedure including the risks, benefits and alternatives for the proposed anesthesia with the patient or authorized representative who has indicated his/her understanding and acceptance.       Plan Discussed with: CRNA  Anesthesia Plan Comments:         Anesthesia Quick Evaluation

## 2019-01-15 NOTE — Interval H&P Note (Signed)
History and Physical Interval Note:  01/15/2019 8:43 AM  Samuel Choi  has presented today for surgery, with the diagnosis of C34.01 LUNG CANCER.  The various methods of treatment have been discussed with the patient and family. After consideration of risks, benefits and other options for treatment, the patient has consented to  Procedure(s): INSERTION PORT-A-CATH (N/A) as a surgical intervention.  The patient's history has been reviewed, patient examined, no change in status, stable for surgery.  I have reviewed the patient's chart and labs.  Questions were answered to the patient's satisfaction.     [name]

## 2019-01-15 NOTE — Op Note (Signed)
01/15/2019  10:14 AM  PATIENT:  [name]  53 y.o. male  PRE-OPERATIVE DIAGNOSIS:  C34.01 LUNG CANCER  POST-OPERATIVE DIAGNOSIS:  C34.01 LUNG CANCER  PROCEDURE:  Procedure(s): INSERTION PORT-A-CATH (Left)  SURGEON:  Surgeon(s) and Role:    * [name], MD - Primary  ASSISTANTS:    ANESTHESIA:     DICTATION:   The patient was brought to the operating suite and placed in the supine position.  The patient was then prepped and draped in usual sterile fashion. The left subclavian vein was percutaneously catheterized. A wire was placed into the venous system under fluoroscopic guidance. An appropriate site was selected on the chest wall and a Port-A-Cath pocket was created. The catheter was tunneled from the port site up to the insertion site. The catheter was then inserted through a peel-away sheath and positioned at the appropriate level in the superior vena cava. The catheter was then assembled and aspirated and flushed easily. It was then secured to the anterior chest wall with interrupted Prolene sutures. The catheter was flushed one last time and the wounds were then closed. The subcutaneous tissues were closed with running absorbable sutures and the skin with nylon. Sterile dressings were applied. Patient was then transported to the recovery room in stable condition.   [name], MD

## 2019-01-15 NOTE — Anesthesia Post-op Follow-up Note (Signed)
Anesthesia QCDR form completed.        

## 2019-01-15 NOTE — Discharge Instructions (Signed)

## 2019-01-16 ENCOUNTER — Encounter: Payer: Self-pay | Admitting: Cardiothoracic Surgery

## 2019-01-16 ENCOUNTER — Other Ambulatory Visit: Payer: Self-pay

## 2019-01-16 ENCOUNTER — Ambulatory Visit
Admission: RE | Admit: 2019-01-16 | Discharge: 2019-01-16 | Disposition: A | Discharge status: Home / Self Care | Payer: BC Managed Care – PPO | Source: Ambulatory Visit | Attending: Radiation Oncology | Admitting: Radiation Oncology

## 2019-01-16 ENCOUNTER — Ambulatory Visit: Payer: BC Managed Care – PPO

## 2019-01-16 DIAGNOSIS — C3411 Malignant neoplasm of upper lobe, right bronchus or lung: Secondary | ICD-10-CM | POA: Diagnosis not present

## 2019-01-17 ENCOUNTER — Inpatient Hospital Stay (HOSPITAL_BASED_OUTPATIENT_CLINIC_OR_DEPARTMENT_OTHER): Payer: BC Managed Care – PPO | Admitting: Oncology

## 2019-01-17 ENCOUNTER — Inpatient Hospital Stay: Payer: BC Managed Care – PPO | Admitting: Nurse Practitioner

## 2019-01-17 ENCOUNTER — Encounter: Payer: Self-pay | Admitting: Nurse Practitioner

## 2019-01-17 ENCOUNTER — Inpatient Hospital Stay: Payer: BC Managed Care – PPO

## 2019-01-17 ENCOUNTER — Encounter: Payer: Self-pay | Admitting: Oncology

## 2019-01-17 ENCOUNTER — Encounter: Payer: Self-pay | Admitting: *Deleted

## 2019-01-17 ENCOUNTER — Ambulatory Visit
Admission: RE | Admit: 2019-01-17 | Discharge: 2019-01-17 | Disposition: A | Discharge status: Home / Self Care | Payer: BC Managed Care – PPO | Source: Ambulatory Visit | Attending: Radiation Oncology | Admitting: Radiation Oncology

## 2019-01-17 ENCOUNTER — Other Ambulatory Visit: Payer: Self-pay

## 2019-01-17 VITALS — BP 134/89 | HR 85 | Temp 96.6°F | Resp 18

## 2019-01-17 VITALS — BP 137/83 | HR 95 | Temp 95.9°F | Wt 141.6 lb

## 2019-01-17 DIAGNOSIS — C3491 Malignant neoplasm of unspecified part of right bronchus or lung: Secondary | ICD-10-CM

## 2019-01-17 DIAGNOSIS — Z5111 Encounter for antineoplastic chemotherapy: Secondary | ICD-10-CM

## 2019-01-17 DIAGNOSIS — Z7189 Other specified counseling: Secondary | ICD-10-CM | POA: Diagnosis not present

## 2019-01-17 DIAGNOSIS — Z801 Family history of malignant neoplasm of trachea, bronchus and lung: Secondary | ICD-10-CM

## 2019-01-17 DIAGNOSIS — Z803 Family history of malignant neoplasm of breast: Secondary | ICD-10-CM

## 2019-01-17 DIAGNOSIS — Z87891 Personal history of nicotine dependence: Secondary | ICD-10-CM

## 2019-01-17 DIAGNOSIS — C3411 Malignant neoplasm of upper lobe, right bronchus or lung: Secondary | ICD-10-CM

## 2019-01-17 DIAGNOSIS — Z95828 Presence of other vascular implants and grafts: Secondary | ICD-10-CM | POA: Diagnosis not present

## 2019-01-17 LAB — COMPREHENSIVE METABOLIC PANEL
ALT: 28 U/L (ref 0–44)
AST: 38 U/L (ref 15–41)
Albumin: 3.9 g/dL (ref 3.5–5.0)
Alkaline Phosphatase: 81 U/L (ref 38–126)
Anion gap: 11 (ref 5–15)
BUN: 12 mg/dL (ref 6–20)
CO2: 25 mmol/L (ref 22–32)
Calcium: 9.4 mg/dL (ref 8.9–10.3)
Chloride: 101 mmol/L (ref 98–111)
Creatinine, Ser: 0.98 mg/dL (ref 0.61–1.24)
GFR calc Af Amer: >60 mL/min (ref >60)
GFR calc non Af Amer: >60 mL/min (ref >60)
Glucose, Bld: 101 mg/dL — ABNORMAL HIGH (ref 70–99)
Potassium: 4.1 mmol/L (ref 3.5–5.1)
Sodium: 137 mmol/L (ref 135–145)
Total Bilirubin: 0.4 mg/dL (ref 0.3–1.2)
Total Protein: 7.6 g/dL (ref 6.5–8.1)

## 2019-01-17 LAB — CBC WITH DIFFERENTIAL/PLATELET
Abs Immature Granulocytes: 0.06 10*3/uL (ref 0.00–0.07)
Basophils Absolute: 0.1 10*3/uL (ref 0.0–0.1)
Basophils Relative: 1 %
Eosinophils Absolute: 0.3 10*3/uL (ref 0.0–0.5)
Eosinophils Relative: 2 %
HCT: 42.6 % (ref 39.0–52.0)
Hemoglobin: 14.8 g/dL (ref 13.0–17.0)
Immature Granulocytes: 0 %
Lymphocytes Relative: 11 %
Lymphs Abs: 1.5 10*3/uL (ref 0.7–4.0)
MCH: 35.7 pg — ABNORMAL HIGH (ref 26.0–34.0)
MCHC: 34.7 g/dL (ref 30.0–36.0)
MCV: 102.9 fL — ABNORMAL HIGH (ref 80.0–100.0)
Monocytes Absolute: 1.3 10*3/uL — ABNORMAL HIGH (ref 0.1–1.0)
Monocytes Relative: 9 %
Neutro Abs: 11 10*3/uL — ABNORMAL HIGH (ref 1.7–7.7)
Neutrophils Relative %: 77 %
Platelets: 194 10*3/uL (ref 150–400)
RBC: 4.14 MIL/uL — ABNORMAL LOW (ref 4.22–5.81)
RDW: 12.2 % (ref 11.5–15.5)
WBC: 14.2 10*3/uL — ABNORMAL HIGH (ref 4.0–10.5)
nRBC: 0 % (ref 0.0–0.2)

## 2019-01-17 MED ORDER — DIPHENHYDRAMINE HCL 50 MG/ML IJ SOLN
50.0000 mg | Freq: Once | INTRAMUSCULAR | Status: AC
  Administered 2019-01-17: 50 mg via INTRAVENOUS
  Filled 2019-01-17: qty 1

## 2019-01-17 MED ORDER — SODIUM CHLORIDE 0.9 % IV SOLN
Freq: Once | INTRAVENOUS | Status: AC
  Administered 2019-01-17: 09:00:00 via INTRAVENOUS
  Filled 2019-01-17: qty 250

## 2019-01-17 MED ORDER — SODIUM CHLORIDE 0.9 % IV SOLN
204.4000 mg | Freq: Once | INTRAVENOUS | Status: AC
  Administered 2019-01-17: 200 mg via INTRAVENOUS
  Filled 2019-01-17: qty 20

## 2019-01-17 MED ORDER — FAMOTIDINE IN NACL 20-0.9 MG/50ML-% IV SOLN
20.0000 mg | Freq: Once | INTRAVENOUS | Status: AC
  Administered 2019-01-17: 20 mg via INTRAVENOUS
  Filled 2019-01-17: qty 50

## 2019-01-17 MED ORDER — SODIUM CHLORIDE 0.9 % IV SOLN
20.0000 mg | Freq: Once | INTRAVENOUS | Status: AC
  Administered 2019-01-17: 20 mg via INTRAVENOUS
  Filled 2019-01-17: qty 2

## 2019-01-17 MED ORDER — HEPARIN SOD (PORK) LOCK FLUSH 100 UNIT/ML IV SOLN
500.0000 [IU] | Freq: Once | INTRAVENOUS | Status: AC | PRN
  Administered 2019-01-17: 500 [IU]
  Filled 2019-01-17: qty 5

## 2019-01-17 MED ORDER — PALONOSETRON HCL INJECTION 0.25 MG/5ML
0.2500 mg | Freq: Once | INTRAVENOUS | Status: AC
  Administered 2019-01-17: 0.25 mg via INTRAVENOUS
  Filled 2019-01-17: qty 5

## 2019-01-17 MED ORDER — SODIUM CHLORIDE 0.9 % IV SOLN
45.0000 mg/m2 | Freq: Once | INTRAVENOUS | Status: AC
  Administered 2019-01-17: 84 mg via INTRAVENOUS
  Filled 2019-01-17: qty 14

## 2019-01-17 NOTE — Progress Notes (Signed)
  Oncology Nurse Navigator Documentation  Navigator Location: [facility]c (01/17/19 0900)   )Navigator Encounter Type: Treatment (01/17/19 0900)                   Treatment Initiated Date: 01/16/19 (01/17/19 0900) Patient Visit Type: MedOnc (01/17/19 0900) Treatment Phase: First Chemo Tx (01/17/19 0900) Barriers/Navigation Needs: No Barriers At This Time (01/17/19 0900)   Interventions: None Required (01/17/19 0900)                      Time Spent with Patient: 15 (01/17/19 0900)

## 2019-01-17 NOTE — Progress Notes (Signed)
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  Chief complaint/Reason for visit- Initial Meeting for [facility], preparing for starting chemotherapy  Heme/Onc history:  Oncology History  Squamous cell carcinoma of lung, right ([place])  09/03/2018 Initial Diagnosis   Squamous cell carcinoma of lung, right ([place])   01/17/2019 -  Chemotherapy   The patient had palonosetron (ALOXI) injection 0.25 mg, 0.25 mg, Intravenous,  Once, 0 of 7 cycles CARBOplatin (PARAPLATIN) 200 mg in sodium chloride 0.9 % 100 mL chemo infusion, 200 mg (100 % of original dose 204.4 mg), Intravenous,  Once, 0 of 7 cycles Dose modification:   (original dose 204.4 mg, Cycle 1) PACLitaxel (TAXOL) 84 mg in sodium chloride 0.9 % 250 mL chemo infusion (</= 60m/m2), 45 mg/m2 = 84 mg, Intravenous,  Once, 0 of 7 cycles  for chemotherapy treatment.    03/10/2019 -  Chemotherapy   The patient had durvalumab (IMFINZI) 640 mg in sodium chloride 0.9 % 100 mL chemo infusion, 10 mg/kg, Intravenous,  Once, 0 of 15 cycles  for chemotherapy treatment.      Interval history-  Samuel Choi 53year old male, who presents to chemo care clinic today for initial meeting in preparation for starting chemotherapy. I introduced the chemo care clinic and we discussed that the role of the clinic is to assist those who are at an increased risk of emergency room visits and/or complications during the course of chemotherapy treatment. We discussed that the increased risk takes into account factors such as age, performance status, and co-morbidities. We also discussed that for some, this might include barriers to  care such as not having a primary care provider, lack of insurance/transportation, or not being able to afford medications. We discussed that the goal of the program is to help prevent unplanned ER visits and help reduce complications during chemotherapy. We do this by discussing specific risk factors to each individual and identifying ways that we can help improve these risk factors and reduce barriers to care.   ECOG FS:1 - Symptomatic but completely ambulatory  Review of systems- Review of Systems  Constitutional: Negative for chills, fever, malaise/fatigue and weight loss.  HENT: Negative for hearing loss, nosebleeds, sore throat and tinnitus.   Eyes: Negative for blurred vision and double vision.  Respiratory: Negative for cough, hemoptysis, shortness of breath and wheezing.   Cardiovascular: Negative for chest pain, palpitations and leg swelling.  Gastrointestinal: Negative for abdominal pain, blood in stool, constipation, diarrhea, melena, nausea and vomiting.  Genitourinary: Negative for dysuria and urgency.  Musculoskeletal: Negative for back pain, falls, joint pain and myalgias.  Skin: Negative for itching and rash.  Neurological: Negative for dizziness, tingling, sensory change, loss of consciousness, weakness and headaches.  Endo/Heme/Allergies: Negative for environmental allergies. Does not bruise/bleed easily.  Psychiatric/Behavioral: Negative for depression. The patient is not nervous/anxious and does not have insomnia.      Current treatment-carbo-Taxol weekly with radiation  No Known Allergies  Past Medical History:  Diagnosis Date  . Alcohol abuse    usually drinks 2-3 drinks per day  . Atherosclerosis 06/2018  . Chronic sinusitis   . [place]   patient  unaware of this.  . Hip fracture ([place]) 05/2018   no surgery  . History of kidney stones 05/2018   per xray, bilateral nephrolitiasis  . Hypertension   . Squamous cell carcinoma of lung,  right ([place]) 06/2018    Past Surgical History:  Procedure Laterality Date  . BRAIN SURGERY  10/2017   nasal/sinus endoscopy. mass benign  . ELECTROMAGNETIC NAVIGATION BROCHOSCOPY Right 06/25/2018   Procedure: ELECTROMAGNETIC NAVIGATION BRONCHOSCOPY;  Surgeon: [name], MD;  Location: [facility] ORS;  Service: Cardiopulmonary;  Laterality: Right;  . ENDOBRONCHIAL ULTRASOUND Right 06/25/2018   Procedure: ENDOBRONCHIAL ULTRASOUND;  Surgeon: [name], MD;  Location: [facility] ORS;  Service: Cardiopulmonary;  Laterality: Right;  . ENDOBRONCHIAL ULTRASOUND Right 12/26/2018   Procedure: ENDOBRONCHIAL ULTRASOUND RIGHT;  Surgeon: [name], MD;  Location: [facility] ORS;  Service: Cardiopulmonary;  Laterality: Right;  . FLEXIBLE BRONCHOSCOPY N/A 08/20/2018   Procedure: FLEXIBLE BRONCHOSCOPY PREOP;  Surgeon: [name], MD;  Location: [facility] ORS;  Service: Thoracic;  Laterality: N/A;  . NASAL SINUS SURGERY  10/2017   At [facility], frontal sinusotomy, ethmoidectomy, resection anterior cranial fossa neoplasm, turbinate resection  . PORTACATH PLACEMENT Left 01/15/2019   Procedure: INSERTION PORT-A-CATH;  Surgeon: [name], MD;  Location: [facility] ORS;  Service: General;  Laterality: Left;  . THORACOTOMY Right 08/13/2018   Procedure: PREOP BROCHOSCOPY WITH RIGHT THORACOTOMY AND RUL RESECTION;  Surgeon: [name], MD;  Location: [facility] ORS;  Service: General;  Laterality: Right;  . THORACOTOMY Right 08/20/2018   Procedure: THORACOTOMY MAJOR RIGHT UPPER LOBE LOBECTOMY;  Surgeon: [name], MD;  Location: [facility] ORS;  Service: Thoracic;  Laterality: Right;  . TOE SURGERY Left    pin in left toe    Social History   Socioeconomic History  . Marital status: Married    Spouse name: lisa  . Number of children: Not on file  . Years of education: Not on file  . Highest education level: Not on file  Occupational History  . 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 Financial  resource strain: Not on file  . Food insecurity    Worry: Not on file    Inability: Not on file  . Transportation needs    Medical: Not on file    Non-medical: Not on file  Tobacco Use  . Smoking status: Former Smoker    Packs/day: 0.50    Years: 15.00    Pack years: 7.50    Types: Cigarettes    Quit date: 08/2018    Years since quitting: 0.4  . Smokeless tobacco: Never Used  Substance and Sexual Activity  . Alcohol use: Yes    Alcohol/week: 3.0 standard drinks    Types: 3 Cans of beer per week    Comment: usually 2 drinks per week, per patient  . Drug use: No  . Sexual activity: Not on file  Lifestyle  . Physical activity    Days per week: Not on file    Minutes per session: Not on file  . Stress: Not on file  Relationships  . Social Herbalist on phone: Not on file    Gets together: Not on file    Attends religious service: Not on file    Active member of club or organization: Not on file    Attends meetings of clubs or organizations: Not on file    Relationship status: Not on file  . Intimate partner violence  Fear of current or ex partner: Not on file    Emotionally abused: Not on file    Physically abused: Not on file    Forced sexual activity: Not on file  Other Topics Concern  . Not on file  Social History Narrative  . Not on file    Family History  Problem Relation Age of Onset  . Breast cancer Mother   . Diabetes Mother   . Lung cancer Father   . Hypertension Father      Current Outpatient Medications:  .  amLODipine (NORVASC) 10 MG tablet, Take 10 mg by mouth daily. , Disp: , Rfl:  .  cloNIDine (CATAPRES) 0.1 MG tablet, Take 0.1 mg by mouth daily. , Disp: , Rfl: 5 .  hydrALAZINE (APRESOLINE) 100 MG tablet, Take 50 mg by mouth 2 (two) times daily. , Disp: , Rfl:  .  hydrochlorothiazide (HYDRODIURIL) 12.5 MG tablet, Take 12.5 mg by mouth daily. , Disp: , Rfl:  .  lidocaine-prilocaine (EMLA) cream, Apply to affected area once, Disp: 30  g, Rfl: 3 .  methocarbamol (ROBAXIN) 500 MG tablet, Take 1 tablet (500 mg total) by mouth every 6 (six) hours., Disp: 30 tablet, Rfl: 0 .  olmesartan (BENICAR) 40 MG tablet, Take 40 mg by mouth daily. , Disp: , Rfl:  .  ondansetron (ZOFRAN) 8 MG tablet, Take 1 tablet (8 mg total) by mouth 2 (two) times daily as needed for refractory nausea / vomiting. Start on day 3 after chemo., Disp: 30 tablet, Rfl: 1 .  oxyCODONE-acetaminophen (PERCOCET) 5-325 MG tablet, Take 1-2 tablets by mouth every 4 (four) hours as needed for severe pain., Disp: 20 tablet, Rfl: 0 .  potassium chloride SA (K-DUR,KLOR-CON) 20 MEQ tablet, Take 20 mEq by mouth 2 (two) times daily., Disp: , Rfl:  .  prochlorperazine (COMPAZINE) 10 MG tablet, Take 1 tablet (10 mg total) by mouth every 6 (six) hours as needed (Nausea or vomiting)., Disp: 30 tablet, Rfl: 1 No current facility-administered medications for this visit.   Facility-Administered Medications Ordered in Other Visits:  .  albuterol (PROVENTIL) (2.5 MG/3ML) 0.083% nebulizer solution 2.5 mg, 2.5 mg, Nebulization, Once, System, Provider Not In  Physical exam: There were no vitals filed for this visit. Physical Exam Constitutional:      General: He is not in acute distress.    Appearance: He is well-developed.     Comments: Thin built, receiving chemotherapy in infusion suite  HENT:     Head: Normocephalic and atraumatic.     Nose: Nose normal.     Mouth/Throat:     Pharynx: No oropharyngeal exudate.  Eyes:     General: No scleral icterus.    Conjunctiva/sclera: Conjunctivae normal.     Pupils: Pupils are equal, round, and reactive to light.  Neck:     Musculoskeletal: Normal range of motion and neck supple.  Cardiovascular:     Rate and Rhythm: Normal rate and regular rhythm.     Heart sounds: Normal heart sounds.  Pulmonary:     Effort: Pulmonary effort is normal.     Breath sounds: Normal breath sounds. No wheezing.  Abdominal:     General: Bowel sounds are  normal. There is no distension.     Palpations: Abdomen is soft.     Tenderness: There is no abdominal tenderness.  Musculoskeletal: Normal range of motion.  Skin:    General: Skin is warm and dry.  Neurological:     Mental Status: He is alert  and oriented to person, place, and time.  Psychiatric:        Mood and Affect: Mood normal.        Behavior: Behavior normal.      CMP Latest Ref Rng & Units 01/17/2019  Glucose 70 - 99 mg/dL 101(H)  BUN 6 - 20 mg/dL 12  Creatinine 0.61 - 1.24 mg/dL 0.98  Sodium 135 - 145 mmol/L 137  Potassium 3.5 - 5.1 mmol/L 4.1  Chloride 98 - 111 mmol/L 101  CO2 22 - 32 mmol/L 25  Calcium 8.9 - 10.3 mg/dL 9.4  Total Protein 6.5 - 8.1 g/dL 7.6  Total Bilirubin 0.3 - 1.2 mg/dL 0.4  Alkaline Phos 38 - 126 U/L 81  AST 15 - 41 U/L 38  ALT 0 - 44 U/L 28   CBC Latest Ref Rng & Units 01/17/2019  WBC 4.0 - 10.5 K/uL 14.2(H)  Hemoglobin 13.0 - 17.0 g/dL 14.8  Hematocrit 39.0 - 52.0 % 42.6  Platelets 150 - 400 K/uL 194    No images are attached to the encounter.  Dg Chest Port 1 View  Result Date: 01/15/2019 CLINICAL DATA:  Squamous cell carcinoma of right lung. Status post Port-A-Cath placement. EXAM: PORTABLE CHEST 1 VIEW COMPARISON:  Radiographs of September 30, 2018. FINDINGS: Interval placement of left subclavian Port-A-Cath with distal tip in expected position of SVC. No pneumothorax is noted. Mild bibasilar subsegmental atelectasis is noted. Small right pleural effusion is noted. Postsurgical changes are noted in right upper lobe. Right hilar prominence is noted consistent with malignancy or metastatic disease. IMPRESSION: Interval placement of left subclavian Port-A-Cath with distal tip in expected position of SVC. No pneumothorax is noted. Electronically Signed   By: Marijo Conception M.D.   On: 01/15/2019 10:36   Dg Chest Port 1 View  Result Date: 12/26/2018 CLINICAL DATA:  Status post bronchoscopy. EXAM: PORTABLE CHEST 1 VIEW COMPARISON:  PET-CT 12/10/2018  and chest x-ray 09/30/2018 FINDINGS: The cardiac silhouette, mediastinal and hilar contours are stable. Stable mild prominence of the right hilum. Stable eventration of the right hemidiaphragm and postoperative changes involving the right hemithorax. There is a small right pleural effusion and streaky atelectasis. No pneumothorax. IMPRESSION: No postprocedural pneumothorax or hematoma is identified. Small right pleural effusion and streaky right basilar atelectasis. Electronically Signed   By: Marijo Sanes M.D.   On: 12/26/2018 15:14   Dg C-arm 1-60 Min-no Report  Result Date: 01/15/2019 Fluoroscopy was utilized by the requesting physician.  No radiographic interpretation.     Assessment and plan- Patient is a 53 y.o. male who presents to [facility] for initial meeting in preparation for starting chemotherapy for the treatment of squamous cell carcinoma of the lung.    1. Recurrent stage I Squamous Cell Carcinoma of Lung -  S/p right thoracotomy and wedge resection of RUL lung mass on 08/13/2018.  Final pathology consistent with squamous cell carcinoma.  On 08/20/2018 patient was taken back to OR and underwent complete lobectomy, pT1b pN0 cM0, stage I squamous cell lung cancer.  Negative margins.  Observation was recommended.  Dec 03, 2018, CT showed local recurrence.  Subsequent PET showed no evidence of distant metastatic disease.  Biopsy on 12/26/2018 confirmed local recurrence of squamous cell lung cancer.  Not surgically resectable. Initiated carbo-taxol and radiation today and will receive maintenance durvalumab following.   2. Chemo Care Clinic/High Risk for ER/Hospitalization during chemotherapy- We discussed the role of the chemo care clinic and identified patient specific risk factors.  I discussed that patient was identified as high risk primarily based on: hospitalizations, ER visits, and history of COPD. He has PCP, Dr. Ginette Pitman whom he sees regularly for health screenings, maintenance, and  acute issues.   3. Social Determinants of Health- we discussed that social determinants of health may have significant impacts on health and outcomes for cancer patients.  Today we discussed specific social determinants of performance status, alcohol use, depression, financial needs, food insecurity, housing, interpersonal violence, social connections, stress, tobacco use, and transportation.  After lengthy discussion he denies any specific needs. We briefly discussed offerings through the cancer center should he need additional assistance in the future. He has stopped tobacco use and reports low alcohol use currently. His wife drives him. 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   4. Co-morbidities Complicating Care:  - COPD- history of tobacco use. No PFTS on file. Saw Dr. Lanney Gins in February during hospitalization. Patient was unaware of this diagnosis and did not start any inhalers. Currently asymptomatic. We discussed pulmonary hygiene, encouraged patient to avoid tobacco. If symptomatic, consider starting symbicort BID, spiriva daily and refer back to pulmonology vs pcp for ongoing management.   5. Palliative Care- based on stage of cancer and/or identified needs today, I will refer patient to palliative care for goals of care and advanced care planning.  We also discussed the role of the Symptom Management Clinic at [facility] for acute issues and methods of contacting clinic/provider. He denies needing specific assistance at this time and He will be followed by [name], RN (Nurse Navigator).    Visit Diagnosis 1. Squamous cell carcinoma of lung, right [facility])     Patient expressed understanding and was in agreement with this plan. He also understands that He can call clinic at any time with any questions, concerns, or complaints.   A total of (15) minutes of face-to-face time was spent with this patient with greater than 50% of that time in counseling and  care-coordination.  Beckey Rutter, DNP, AGNP-C [place] at [facility] [phone] (work cell) [phone] (office)  CC: Dr. Tasia Catchings

## 2019-01-17 NOTE — Progress Notes (Signed)
Pt tolerated infusion well. Pt and VS stable at discharge.  

## 2019-01-17 NOTE — Progress Notes (Signed)
Hematology/Oncology Follow up note [facility] Telephone:(336) [phone] Fax:(336) [phone]   Patient Care Team: Tracie Harrier, MD as PCP - General (Internal Medicine) [name], RN as Registered Nurse  REASON FOR VISIT:  Follow up for treatment of squamous lung cancer.   HISTORY OF PRESENTING ILLNESS:  # Dec 2019 Stage I squamous lung cancer #s/p Bronchoscopy on 06/25/2018. subcarina EBUS FNA was non diagnostic, hypocellular specimen.  # 08/13/2018 CT guided right upper lobe biopsy pathology showed dense fibrosis and mixed inflammatory cells with prominent polytypic plasma cells component. Focal benign bronchial wall and alveolar spaces. No malignancy was identified.   # 08/13/2018 underwent right thoracotomy and wedge resection of a right upper lobe mass.  Frozen section was consistent with an inflammatory process.  On the second postop day, preliminary pathology reports high-grade malignancy.  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  08/20/2018 Patient was therefore taken back to the OR and underwent take complete lobectomy  pT1b pN0 cM0 stage I squamous cell lung cancer.  Margin is negative.  Recommend observation.    # 12/03/2018 CT chest w contrast showed local recurrence.  12/10/2018 PET showed No evidence of distant metastatic disease # 12/26/2018 s/p bronchoscopy biopsy. Confirmed local recurrence of squamous cell lung cancer.    INTERVAL HISTORY Samuel Choi is a 53 y.o. male who has above history reviewed by me today presents for evaluation prior to chemotherapy for treatment of  recurrent  squamous cell lung cancer. He has had medi port placed by Dr.Oaks.  He has no new complaints. Today.  He has started Radiation on 01/16/2019.   Today he reports no new complaints. Here to discuss biopsy results and management plan.    Denies cough, hemoptysis, shortness of breath.  Review of Systems  Constitutional: Negative for appetite change, chills,  fatigue, fever and unexpected weight change.  HENT:   Negative for hearing loss and voice change.   Eyes: Negative for eye problems and icterus.  Respiratory: Negative for chest tightness, cough and shortness of breath.   Cardiovascular: Negative for chest pain and leg swelling.  Gastrointestinal: Negative for abdominal distention and abdominal pain.  Endocrine: Negative for hot flashes.  Genitourinary: Negative for difficulty urinating, dysuria and frequency.   Musculoskeletal: Negative for arthralgias.  Skin: Negative for itching and rash.  Neurological: Negative for light-headedness and numbness.  Hematological: Negative for adenopathy. Does not bruise/bleed easily.  Psychiatric/Behavioral: Negative for confusion.    MEDICAL HISTORY:  Past Medical History:  Diagnosis Date  . Alcohol abuse    usually drinks 2-3 drinks per day  . Atherosclerosis 06/2018  . Chronic sinusitis   . Emphysema of lung ([place]) 06/2018   patient unaware of this.  . Hip fracture ([place]) 05/2018   no surgery  . History of kidney stones 05/2018   per xray, bilateral nephrolitiasis  . Hypertension   . Squamous cell carcinoma of lung, right ([place]) 06/2018    SURGICAL HISTORY: Past Surgical History:  Procedure Laterality Date  . BRAIN SURGERY  10/2017   nasal/sinus endoscopy. mass benign  . ELECTROMAGNETIC NAVIGATION BROCHOSCOPY Right 06/25/2018   Procedure: ELECTROMAGNETIC NAVIGATION BRONCHOSCOPY;  Surgeon: [name], MD;  Location: [facility] ORS;  Service: Cardiopulmonary;  Laterality: Right;  . ENDOBRONCHIAL ULTRASOUND Right 06/25/2018   Procedure: ENDOBRONCHIAL ULTRASOUND;  Surgeon: [name], MD;  Location: [facility] ORS;  Service: Cardiopulmonary;  Laterality: Right;  . ENDOBRONCHIAL ULTRASOUND Right 12/26/2018   Procedure: ENDOBRONCHIAL ULTRASOUND RIGHT;  Surgeon: [name], MD;  Location: [facility]  ORS;  Service: Cardiopulmonary;  Laterality: Right;  . FLEXIBLE BRONCHOSCOPY N/A 08/20/2018   Procedure:  FLEXIBLE BRONCHOSCOPY PREOP;  Surgeon: [name], MD;  Location: [facility] ORS;  Service: Thoracic;  Laterality: N/A;  . NASAL SINUS SURGERY  10/2017   At [facility], frontal sinusotomy, ethmoidectomy, resection anterior cranial fossa neoplasm, turbinate resection  . PORTACATH PLACEMENT Left 01/15/2019   Procedure: INSERTION PORT-A-CATH;  Surgeon: [name], MD;  Location: [facility] ORS;  Service: General;  Laterality: Left;  . THORACOTOMY Right 08/13/2018   Procedure: PREOP BROCHOSCOPY WITH RIGHT THORACOTOMY AND RUL RESECTION;  Surgeon: [name], MD;  Location: [facility] ORS;  Service: General;  Laterality: Right;  . THORACOTOMY Right 08/20/2018   Procedure: THORACOTOMY MAJOR RIGHT UPPER LOBE LOBECTOMY;  Surgeon: [name], MD;  Location: [facility] ORS;  Service: Thoracic;  Laterality: Right;  . TOE SURGERY Left    pin in left toe    SOCIAL HISTORY: Social History   Socioeconomic History  . Marital status: Married    Spouse name: lisa  . Number of children: Not on file  . Years of education: Not on file  . Highest education level: Not on file  Occupational History  . 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 Financial resource strain: Not on file  . Food insecurity    Worry: Not on file    Inability: Not on file  . Transportation needs    Medical: Not on file    Non-medical: Not on file  Tobacco Use  . Smoking status: Former Smoker    Packs/day: 0.50    Years: 15.00    Pack years: 7.50    Types: Cigarettes    Quit date: 08/2018    Years since quitting: 0.4  . Smokeless tobacco: Never Used  Substance and Sexual Activity  . Alcohol use: Yes    Alcohol/week: 3.0 standard drinks    Types: 3 Cans of beer per week    Comment: usually 2 drinks per week, per patient  . Drug use: No  . Sexual activity: Not on file  Lifestyle  . Physical activity    Days per week: Not on file    Minutes per session: Not on file  . Stress: Not on file   Relationships  . Social Herbalist on phone: Not on file    Gets together: Not on file    Attends religious service: Not on file    Active member of club or organization: Not on file    Attends meetings of clubs or organizations: Not on file    Relationship status: Not on file  . Intimate partner violence    Fear of current or ex partner: Not on file    Emotionally abused: Not on file    Physically abused: Not on file    Forced sexual activity: Not on file  Other Topics Concern  . Not on file  Social History Narrative  . Not on file    FAMILY HISTORY: Family History  Problem Relation Age of Onset  . Breast cancer Mother   . Diabetes Mother   . Lung cancer Father   . Hypertension Father     ALLERGIES:  has No Known Allergies.  MEDICATIONS:  Current Outpatient Medications  Medication Sig Dispense Refill  . amLODipine (NORVASC) 10 MG tablet Take 10 mg by mouth daily.     . cloNIDine (CATAPRES) 0.1 MG tablet Take 0.1 mg by  mouth daily.   5  . hydrALAZINE (APRESOLINE) 100 MG tablet Take 50 mg by mouth 2 (two) times daily.     . hydrochlorothiazide (HYDRODIURIL) 12.5 MG tablet Take 12.5 mg by mouth daily.     Marland Kitchen lidocaine-prilocaine (EMLA) cream Apply to affected area once 30 g 3  . methocarbamol (ROBAXIN) 500 MG tablet Take 1 tablet (500 mg total) by mouth every 6 (six) hours. 30 tablet 0  . olmesartan (BENICAR) 40 MG tablet Take 40 mg by mouth daily.     . ondansetron (ZOFRAN) 8 MG tablet Take 1 tablet (8 mg total) by mouth 2 (two) times daily as needed for refractory nausea / vomiting. Start on day 3 after chemo. 30 tablet 1  . oxyCODONE-acetaminophen (PERCOCET) 5-325 MG tablet Take 1-2 tablets by mouth every 4 (four) hours as needed for severe pain. 20 tablet 0  . potassium chloride SA (K-DUR,KLOR-CON) 20 MEQ tablet Take 20 mEq by mouth 2 (two) times daily.    . prochlorperazine (COMPAZINE) 10 MG tablet Take 1 tablet (10 mg total) by mouth every 6 (six) hours as  needed (Nausea or vomiting). 30 tablet 1   No current facility-administered medications for this visit.    Facility-Administered Medications Ordered in Other Visits  Medication Dose Route Frequency Provider Last Rate Last Dose  . albuterol (PROVENTIL) (2.5 MG/3ML) 0.083% nebulizer solution 2.5 mg  2.5 mg Nebulization Once System, Provider Not In         PHYSICAL EXAMINATION: ECOG PERFORMANCE STATUS: 0 - Asymptomatic Vitals:   01/17/19 0926  BP: 137/83  Pulse: 95  Temp: (!) 95.9 F (35.5 C)  SpO2: 96%   Filed Weights   01/17/19 0926  Weight: 141 lb 9 oz (64.2 kg)    Physical Exam Constitutional:      General: He is not in acute distress.    Comments: Thin  HENT:     Head: Normocephalic and atraumatic.  Eyes:     General: No scleral icterus.    Pupils: Pupils are equal, round, and reactive to light.  Neck:     Musculoskeletal: Normal range of motion and neck supple.  Cardiovascular:     Rate and Rhythm: Normal rate and regular rhythm.     Heart sounds: Normal heart sounds.  Pulmonary:     Effort: Pulmonary effort is normal. No respiratory distress.     Breath sounds: Normal breath sounds. No wheezing.  Abdominal:     General: Bowel sounds are normal. There is no distension.     Palpations: Abdomen is soft. There is no mass.     Tenderness: There is no abdominal tenderness.  Musculoskeletal: Normal range of motion.        General: No deformity.  Skin:    General: Skin is warm and dry.     Findings: No erythema or rash.  Neurological:     Mental Status: He is alert and oriented to person, place, and time.     Cranial Nerves: No cranial nerve deficit.     Coordination: Coordination normal.  Psychiatric:        Behavior: Behavior normal.        Thought Content: Thought content normal.      LABORATORY DATA:  I have reviewed the data as listed Lab Results  Component Value Date   WBC 14.2 (H) 01/17/2019   HGB 14.8 01/17/2019   HCT 42.6 01/17/2019   MCV  102.9 (H) 01/17/2019   PLT 194 01/17/2019   Recent  Labs    08/27/18 0715 12/03/18 0929 01/02/19 1500 01/17/19 0757  NA 133*  --  145 137  K 4.4  --  4.0 4.1  CL 97*  --  105 101  CO2 29  --  27 25  GLUCOSE 107*  --  83 101*  BUN 16  --  13 12  CREATININE 1.07 0.80 0.90 0.98  CALCIUM 9.7  --  9.2 9.4  GFRNONAA >60  --  >60 >60  GFRAA >60  --  >60 >60  PROT 8.1  --  7.8 7.6  ALBUMIN 3.6  --  4.1 3.9  AST 39  --  24 38  ALT 57*  --  16 28  ALKPHOS 108  --  97 81  BILITOT 0.6  --  0.7 0.4   Iron/TIBC/Ferritin/ %Sat No results found for: IRON, TIBC, FERRITIN, IRONPCTSAT   Pathology 12/26/2018 Right hilar adenopathy/mass-EUS guided FNA Positive for malignancy, non small cell carcinoma, morphologically similar to previous right upper lobe squamous cell carcinoma.   ASSESSMENT & PLAN:  1. Squamous cell carcinoma of lung, right ([place])   2. Goals of care, counseling/discussion   3. Encounter for antineoplastic chemotherapy    # Recurrent Squamous cell carcinoma of lung,  Labs are reviewed and discussed with patient.  Counts are acceptable to proceed with cycle 1 carboplatin and Taxol treatment.  Patient has antiemetics and reports understanding premed instruction.    We spent sufficient time to discuss many aspect of care, questions were answered to patient's satisfaction. All questions were answered. The patient knows to call the clinic with any problems questions or concerns.  Return of visit: 1 weeks.  We spent sufficient time to discuss many aspect of care, questions were answered to patient's satisfaction. Total face to face encounter time for this patient visit was 25 min. >50% of the time was  spent in counseling and coordination of care.     Earlie Server, MD, PhD Hematology Oncology [place] at [facility]  01/17/2019

## 2019-01-20 ENCOUNTER — Ambulatory Visit
Admission: RE | Admit: 2019-01-20 | Discharge: 2019-01-20 | Disposition: A | Discharge status: Home / Self Care | Payer: BC Managed Care – PPO | Source: Ambulatory Visit | Attending: Oncology | Admitting: Oncology

## 2019-01-20 ENCOUNTER — Other Ambulatory Visit: Payer: Self-pay | Admitting: Oncology

## 2019-01-20 ENCOUNTER — Other Ambulatory Visit: Payer: Self-pay

## 2019-01-20 ENCOUNTER — Ambulatory Visit
Admission: RE | Admit: 2019-01-20 | Discharge: 2019-01-20 | Disposition: A | Discharge status: Home / Self Care | Payer: BC Managed Care – PPO | Source: Ambulatory Visit | Attending: Radiation Oncology | Admitting: Radiation Oncology

## 2019-01-20 DIAGNOSIS — C3491 Malignant neoplasm of unspecified part of right bronchus or lung: Secondary | ICD-10-CM

## 2019-01-20 DIAGNOSIS — C3411 Malignant neoplasm of upper lobe, right bronchus or lung: Secondary | ICD-10-CM | POA: Diagnosis not present

## 2019-01-20 MED ORDER — GADOBUTROL 1 MMOL/ML IV SOLN
6.0000 mL | Freq: Once | INTRAVENOUS | Status: AC | PRN
  Administered 2019-01-20: 6 mL via INTRAVENOUS

## 2019-01-21 ENCOUNTER — Ambulatory Visit
Admission: RE | Admit: 2019-01-21 | Discharge: 2019-01-21 | Disposition: A | Discharge status: Home / Self Care | Payer: BC Managed Care – PPO | Source: Ambulatory Visit | Attending: Radiation Oncology | Admitting: Radiation Oncology

## 2019-01-21 ENCOUNTER — Other Ambulatory Visit: Payer: Self-pay

## 2019-01-21 DIAGNOSIS — C3411 Malignant neoplasm of upper lobe, right bronchus or lung: Secondary | ICD-10-CM | POA: Diagnosis not present

## 2019-01-22 ENCOUNTER — Ambulatory Visit
Admission: RE | Admit: 2019-01-22 | Discharge: 2019-01-22 | Disposition: A | Discharge status: Home / Self Care | Payer: BC Managed Care – PPO | Source: Ambulatory Visit | Attending: Radiation Oncology | Admitting: Radiation Oncology

## 2019-01-22 ENCOUNTER — Other Ambulatory Visit: Payer: Self-pay

## 2019-01-22 DIAGNOSIS — C3411 Malignant neoplasm of upper lobe, right bronchus or lung: Secondary | ICD-10-CM | POA: Diagnosis not present

## 2019-01-23 ENCOUNTER — Other Ambulatory Visit: Payer: Self-pay

## 2019-01-23 ENCOUNTER — Ambulatory Visit
Admission: RE | Admit: 2019-01-23 | Discharge: 2019-01-23 | Disposition: A | Discharge status: Home / Self Care | Payer: BC Managed Care – PPO | Source: Ambulatory Visit | Attending: Radiation Oncology | Admitting: Radiation Oncology

## 2019-01-23 DIAGNOSIS — C3411 Malignant neoplasm of upper lobe, right bronchus or lung: Secondary | ICD-10-CM | POA: Diagnosis not present

## 2019-01-24 ENCOUNTER — Ambulatory Visit
Admission: RE | Admit: 2019-01-24 | Discharge: 2019-01-24 | Disposition: A | Discharge status: Home / Self Care | Payer: BC Managed Care – PPO | Source: Ambulatory Visit | Attending: Radiation Oncology | Admitting: Radiation Oncology

## 2019-01-24 ENCOUNTER — Encounter: Payer: Self-pay | Admitting: Oncology

## 2019-01-24 ENCOUNTER — Ambulatory Visit (INDEPENDENT_AMBULATORY_CARE_PROVIDER_SITE_OTHER): Payer: BC Managed Care – PPO

## 2019-01-24 ENCOUNTER — Inpatient Hospital Stay (HOSPITAL_BASED_OUTPATIENT_CLINIC_OR_DEPARTMENT_OTHER): Payer: BC Managed Care – PPO | Admitting: Oncology

## 2019-01-24 ENCOUNTER — Inpatient Hospital Stay: Payer: BC Managed Care – PPO | Admitting: *Deleted

## 2019-01-24 ENCOUNTER — Inpatient Hospital Stay: Payer: BC Managed Care – PPO

## 2019-01-24 ENCOUNTER — Other Ambulatory Visit: Payer: Self-pay

## 2019-01-24 VITALS — BP 143/86 | HR 100 | Temp 97.3°F | Ht 73.0 in | Wt 139.0 lb

## 2019-01-24 DIAGNOSIS — C3491 Malignant neoplasm of unspecified part of right bronchus or lung: Secondary | ICD-10-CM

## 2019-01-24 DIAGNOSIS — D72829 Elevated white blood cell count, unspecified: Secondary | ICD-10-CM

## 2019-01-24 DIAGNOSIS — C3411 Malignant neoplasm of upper lobe, right bronchus or lung: Secondary | ICD-10-CM

## 2019-01-24 DIAGNOSIS — D7589 Other specified diseases of blood and blood-forming organs: Secondary | ICD-10-CM | POA: Diagnosis not present

## 2019-01-24 DIAGNOSIS — C3401 Malignant neoplasm of right main bronchus: Secondary | ICD-10-CM

## 2019-01-24 DIAGNOSIS — Z5111 Encounter for antineoplastic chemotherapy: Secondary | ICD-10-CM | POA: Diagnosis not present

## 2019-01-24 DIAGNOSIS — Z87891 Personal history of nicotine dependence: Secondary | ICD-10-CM

## 2019-01-24 DIAGNOSIS — Z95828 Presence of other vascular implants and grafts: Secondary | ICD-10-CM

## 2019-01-24 LAB — CBC WITH DIFFERENTIAL/PLATELET
Abs Immature Granulocytes: 0.04 10*3/uL (ref 0.00–0.07)
Basophils Absolute: 0.1 10*3/uL (ref 0.0–0.1)
Basophils Relative: 1 %
Eosinophils Absolute: 0.1 10*3/uL (ref 0.0–0.5)
Eosinophils Relative: 1 %
HCT: 40 % (ref 39.0–52.0)
Hemoglobin: 14 g/dL (ref 13.0–17.0)
Immature Granulocytes: 0 %
Lymphocytes Relative: 14 %
Lymphs Abs: 1.9 10*3/uL (ref 0.7–4.0)
MCH: 35.8 pg — ABNORMAL HIGH (ref 26.0–34.0)
MCHC: 35 g/dL (ref 30.0–36.0)
MCV: 102.3 fL — ABNORMAL HIGH (ref 80.0–100.0)
Monocytes Absolute: 0.6 10*3/uL (ref 0.1–1.0)
Monocytes Relative: 5 %
Neutro Abs: 10.3 10*3/uL — ABNORMAL HIGH (ref 1.7–7.7)
Neutrophils Relative %: 79 %
Platelets: 275 10*3/uL (ref 150–400)
RBC: 3.91 MIL/uL — ABNORMAL LOW (ref 4.22–5.81)
RDW: 12.1 % (ref 11.5–15.5)
WBC: 13 10*3/uL — ABNORMAL HIGH (ref 4.0–10.5)
nRBC: 0 % (ref 0.0–0.2)

## 2019-01-24 LAB — COMPREHENSIVE METABOLIC PANEL
ALT: 24 U/L (ref 0–44)
AST: 31 U/L (ref 15–41)
Albumin: 3.9 g/dL (ref 3.5–5.0)
Alkaline Phosphatase: 77 U/L (ref 38–126)
Anion gap: 11 (ref 5–15)
BUN: 15 mg/dL (ref 6–20)
CO2: 24 mmol/L (ref 22–32)
Calcium: 9.1 mg/dL (ref 8.9–10.3)
Chloride: 103 mmol/L (ref 98–111)
Creatinine, Ser: 0.76 mg/dL (ref 0.61–1.24)
GFR calc Af Amer: >60 mL/min (ref >60)
GFR calc non Af Amer: >60 mL/min (ref >60)
Glucose, Bld: 91 mg/dL (ref 70–99)
Potassium: 4.2 mmol/L (ref 3.5–5.1)
Sodium: 138 mmol/L (ref 135–145)
Total Bilirubin: 0.5 mg/dL (ref 0.3–1.2)
Total Protein: 7.7 g/dL (ref 6.5–8.1)

## 2019-01-24 MED ORDER — HEPARIN SOD (PORK) LOCK FLUSH 100 UNIT/ML IV SOLN
500.0000 [IU] | Freq: Once | INTRAVENOUS | Status: AC | PRN
  Administered 2019-01-24: 500 [IU]
  Filled 2019-01-24: qty 5

## 2019-01-24 MED ORDER — SODIUM CHLORIDE 0.9 % IV SOLN
Freq: Once | INTRAVENOUS | Status: AC
  Administered 2019-01-24: 10:00:00 via INTRAVENOUS
  Filled 2019-01-24: qty 250

## 2019-01-24 MED ORDER — SODIUM CHLORIDE 0.9 % IV SOLN
20.0000 mg | Freq: Once | INTRAVENOUS | Status: AC
  Administered 2019-01-24: 20 mg via INTRAVENOUS
  Filled 2019-01-24: qty 2

## 2019-01-24 MED ORDER — DIPHENHYDRAMINE HCL 50 MG/ML IJ SOLN
50.0000 mg | Freq: Once | INTRAMUSCULAR | Status: AC
  Administered 2019-01-24: 50 mg via INTRAVENOUS
  Filled 2019-01-24: qty 1

## 2019-01-24 MED ORDER — SODIUM CHLORIDE 0.9 % IV SOLN
200.0000 mg | Freq: Once | INTRAVENOUS | Status: AC
  Administered 2019-01-24: 200 mg via INTRAVENOUS
  Filled 2019-01-24: qty 20

## 2019-01-24 MED ORDER — SODIUM CHLORIDE 0.9% FLUSH
10.0000 mL | Freq: Once | INTRAVENOUS | Status: AC
  Administered 2019-01-24: 10 mL via INTRAVENOUS
  Filled 2019-01-24: qty 10

## 2019-01-24 MED ORDER — PALONOSETRON HCL INJECTION 0.25 MG/5ML
0.2500 mg | Freq: Once | INTRAVENOUS | Status: AC
  Administered 2019-01-24: 10:00:00 0.25 mg via INTRAVENOUS
  Filled 2019-01-24: qty 5

## 2019-01-24 MED ORDER — SODIUM CHLORIDE 0.9 % IV SOLN
45.0000 mg/m2 | Freq: Once | INTRAVENOUS | Status: AC
  Administered 2019-01-24: 84 mg via INTRAVENOUS
  Filled 2019-01-24: qty 14

## 2019-01-24 MED ORDER — FAMOTIDINE IN NACL 20-0.9 MG/50ML-% IV SOLN
20.0000 mg | Freq: Once | INTRAVENOUS | Status: AC
  Administered 2019-01-24: 20 mg via INTRAVENOUS
  Filled 2019-01-24: qty 50

## 2019-01-24 NOTE — Progress Notes (Signed)
Hematology/Oncology Follow up note [facility] Telephone:(336) [phone] Fax:(336) [phone]   Patient Care Team: Tracie Harrier, MD as PCP - General (Internal Medicine) [name], RN as Registered Nurse  REASON FOR VISIT:  Follow up for treatment of squamous lung cancer.   HISTORY OF PRESENTING ILLNESS:  # Dec 2019 Stage I squamous lung cancer #s/p Bronchoscopy on 06/25/2018. subcarina EBUS FNA was non diagnostic, hypocellular specimen.  # 08/13/2018 CT guided right upper lobe biopsy pathology showed dense fibrosis and mixed inflammatory cells with prominent polytypic plasma cells component. Focal benign bronchial wall and alveolar spaces. No malignancy was identified.   # 08/13/2018 underwent right thoracotomy and wedge resection of a right upper lobe mass.  Frozen section was consistent with an inflammatory process.  On the second postop day, preliminary pathology reports high-grade malignancy.  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  08/20/2018 Patient was therefore taken back to the OR and underwent take complete lobectomy  pT1b pN0 cM0 stage I squamous cell lung cancer.  Margin is negative.  Recommend observation.    # 12/03/2018 CT chest w contrast showed local recurrence.  12/10/2018 PET showed No evidence of distant metastatic disease # 12/26/2018 s/p bronchoscopy biopsy. Confirmed local recurrence of squamous cell lung cancer.   medi port placed by Dr.Oaks.  Cancer treatment Started concurrent chemoradiation on 01/16/2019 Carboplatin AUC of 2 and Taxol 45 mg per metered square weekly  INTERVAL HISTORY Samuel Choi is a 53 y.o. male who has above history reviewed by me today presents for evaluation prior to chemotherapy for treatment of  recurrent  squamous cell lung cancer. Status post cycle 1 carboplatin and Taxol concurrently with radiation Reports tolerating well. Denies any fever, chills, nausea, vomiting, diarrhea, abdominal pain, denies  any neuropathy .  He has no new complaints today.   Review of Systems  Constitutional: Negative for appetite change, chills, fatigue, fever and unexpected weight change.  HENT:   Negative for hearing loss and voice change.   Eyes: Negative for eye problems and icterus.  Respiratory: Negative for chest tightness, cough and shortness of breath.   Cardiovascular: Negative for chest pain and leg swelling.  Gastrointestinal: Negative for abdominal distention and abdominal pain.  Endocrine: Negative for hot flashes.  Genitourinary: Negative for difficulty urinating, dysuria and frequency.   Musculoskeletal: Negative for arthralgias.  Skin: Negative for itching and rash.  Neurological: Negative for light-headedness and numbness.  Hematological: Negative for adenopathy. Does not bruise/bleed easily.  Psychiatric/Behavioral: Negative for confusion.    MEDICAL HISTORY:  Past Medical History:  Diagnosis Date  . Alcohol abuse    usually drinks 2-3 drinks per day  . Atherosclerosis 06/2018  . Chronic sinusitis   . Emphysema of lung ([place]) 06/2018   patient unaware of this.  . Hip fracture ([place]) 05/2018   no surgery  . History of kidney stones 05/2018   per xray, bilateral nephrolitiasis  . Hypertension   . Squamous cell carcinoma of lung, right ([place]) 06/2018    SURGICAL HISTORY: Past Surgical History:  Procedure Laterality Date  . BRAIN SURGERY  10/2017   nasal/sinus endoscopy. mass benign  . ELECTROMAGNETIC NAVIGATION BROCHOSCOPY Right 06/25/2018   Procedure: ELECTROMAGNETIC NAVIGATION BRONCHOSCOPY;  Surgeon: [name], MD;  Location: [facility] ORS;  Service: Cardiopulmonary;  Laterality: Right;  . ENDOBRONCHIAL ULTRASOUND Right 06/25/2018   Procedure: ENDOBRONCHIAL ULTRASOUND;  Surgeon: [name], MD;  Location: [facility] ORS;  Service: Cardiopulmonary;  Laterality: Right;  . ENDOBRONCHIAL ULTRASOUND Right 12/26/2018  Procedure: ENDOBRONCHIAL ULTRASOUND RIGHT;  Surgeon: [name],  MD;  Location: [facility] ORS;  Service: Cardiopulmonary;  Laterality: Right;  . FLEXIBLE BRONCHOSCOPY N/A 08/20/2018   Procedure: FLEXIBLE BRONCHOSCOPY PREOP;  Surgeon: [name], MD;  Location: [facility] ORS;  Service: Thoracic;  Laterality: N/A;  . NASAL SINUS SURGERY  10/2017   At [facility], frontal sinusotomy, ethmoidectomy, resection anterior cranial fossa neoplasm, turbinate resection  . PORTACATH PLACEMENT Left 01/15/2019   Procedure: INSERTION PORT-A-CATH;  Surgeon: [name], MD;  Location: [facility] ORS;  Service: General;  Laterality: Left;  . THORACOTOMY Right 08/13/2018   Procedure: PREOP BROCHOSCOPY WITH RIGHT THORACOTOMY AND RUL RESECTION;  Surgeon: [name], MD;  Location: [facility] ORS;  Service: General;  Laterality: Right;  . THORACOTOMY Right 08/20/2018   Procedure: THORACOTOMY MAJOR RIGHT UPPER LOBE LOBECTOMY;  Surgeon: [name], MD;  Location: [facility] ORS;  Service: Thoracic;  Laterality: Right;  . TOE SURGERY Left    pin in left toe    SOCIAL HISTORY: Social History   Socioeconomic History  . Marital status: Married    Spouse name: lisa  . Number of children: Not on file  . Years of education: Not on file  . Highest education level: Not on file  Occupational History  . 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 Financial resource strain: Not on file  . Food insecurity    Worry: Not on file    Inability: Not on file  . Transportation needs    Medical: Not on file    Non-medical: Not on file  Tobacco Use  . Smoking status: Former Smoker    Packs/day: 0.50    Years: 15.00    Pack years: 7.50    Types: Cigarettes    Quit date: 08/2018    Years since quitting: 0.4  . Smokeless tobacco: Never Used  Substance and Sexual Activity  . Alcohol use: Yes    Alcohol/week: 3.0 standard drinks    Types: 3 Cans of beer per week    Comment: usually 2 drinks per week, per patient  . Drug use: No  . Sexual activity: Not on file  Lifestyle   . Physical activity    Days per week: Not on file    Minutes per session: Not on file  . Stress: Not on file  Relationships  . Social Herbalist on phone: Not on file    Gets together: Not on file    Attends religious service: Not on file    Active member of club or organization: Not on file    Attends meetings of clubs or organizations: Not on file    Relationship status: Not on file  . Intimate partner violence    Fear of current or ex partner: Not on file    Emotionally abused: Not on file    Physically abused: Not on file    Forced sexual activity: Not on file  Other Topics Concern  . Not on file  Social History Narrative  . Not on file    FAMILY HISTORY: Family History  Problem Relation Age of Onset  . Breast cancer Mother   . Diabetes Mother   . Lung cancer Father   . Hypertension Father     ALLERGIES:  has No Known Allergies.  MEDICATIONS:  Current Outpatient Medications  Medication Sig Dispense Refill  . amLODipine (NORVASC) 10 MG tablet Take 10 mg by mouth daily.     Marland Kitchen  cloNIDine (CATAPRES) 0.1 MG tablet Take 0.1 mg by mouth daily.   5  . hydrALAZINE (APRESOLINE) 100 MG tablet Take 50 mg by mouth 2 (two) times daily.     . hydrochlorothiazide (HYDRODIURIL) 12.5 MG tablet Take 12.5 mg by mouth daily.     Marland Kitchen lidocaine-prilocaine (EMLA) cream Apply to affected area once 30 g 3  . methocarbamol (ROBAXIN) 500 MG tablet Take 1 tablet (500 mg total) by mouth every 6 (six) hours. 30 tablet 0  . olmesartan (BENICAR) 40 MG tablet Take 40 mg by mouth daily.     . ondansetron (ZOFRAN) 8 MG tablet Take 1 tablet (8 mg total) by mouth 2 (two) times daily as needed for refractory nausea / vomiting. Start on day 3 after chemo. 30 tablet 1  . oxyCODONE-acetaminophen (PERCOCET) 5-325 MG tablet Take 1-2 tablets by mouth every 4 (four) hours as needed for severe pain. 20 tablet 0  . potassium chloride SA (K-DUR,KLOR-CON) 20 MEQ tablet Take 20 mEq by mouth 2 (two) times  daily.    . prochlorperazine (COMPAZINE) 10 MG tablet Take 1 tablet (10 mg total) by mouth every 6 (six) hours as needed (Nausea or vomiting). 30 tablet 1   No current facility-administered medications for this visit.    Facility-Administered Medications Ordered in Other Visits  Medication Dose Route Frequency Provider Last Rate Last Dose  . albuterol (PROVENTIL) (2.5 MG/3ML) 0.083% nebulizer solution 2.5 mg  2.5 mg Nebulization Once System, Provider Not In         PHYSICAL EXAMINATION: ECOG PERFORMANCE STATUS: 0 - Asymptomatic Vitals:   01/24/19 0936  BP: (!) 143/86  Pulse: 100  Temp: (!) 97.3 F (36.3 C)   Filed Weights   01/24/19 0936  Weight: 139 lb (63 kg)    Physical Exam Constitutional:      General: He is not in acute distress.    Comments: Thin  HENT:     Head: Normocephalic and atraumatic.  Eyes:     General: No scleral icterus.    Pupils: Pupils are equal, round, and reactive to light.  Neck:     Musculoskeletal: Normal range of motion and neck supple.  Cardiovascular:     Rate and Rhythm: Normal rate and regular rhythm.     Heart sounds: Normal heart sounds.  Pulmonary:     Effort: Pulmonary effort is normal. No respiratory distress.     Breath sounds: Normal breath sounds. No wheezing.  Abdominal:     General: Bowel sounds are normal. There is no distension.     Palpations: Abdomen is soft. There is no mass.     Tenderness: There is no abdominal tenderness.  Musculoskeletal: Normal range of motion.        General: No deformity.  Skin:    General: Skin is warm and dry.     Findings: No erythema or rash.  Neurological:     Mental Status: He is alert and oriented to person, place, and time.     Cranial Nerves: No cranial nerve deficit.     Coordination: Coordination normal.  Psychiatric:        Behavior: Behavior normal.        Thought Content: Thought content normal.      LABORATORY DATA:  I have reviewed the data as listed Lab Results   Component Value Date   WBC 13.0 (H) 01/24/2019   HGB 14.0 01/24/2019   HCT 40.0 01/24/2019   MCV 102.3 (H) 01/24/2019   PLT  275 01/24/2019   Recent Labs    01/02/19 1500 01/17/19 0757 01/24/19 0911  NA 145 137 138  K 4.0 4.1 4.2  CL 105 101 103  CO2 _0 GLUCOSE 83 101* 91  BUN _1 CREATININE 0.90 0.98 0.76  CALCIUM 9.2 9.4 9.1  GFRNONAA >60 >60 >60  GFRAA >60 >60 >60  PROT 7.8 7.6 7.7  ALBUMIN 4.1 3.9 3.9  AST 24 38 31  ALT _2 ALKPHOS 97 81 77  BILITOT 0.7 0.4 0.5   Iron/TIBC/Ferritin/ %Sat No results found for: IRON, TIBC, FERRITIN, IRONPCTSAT   Pathology 12/26/2018 Right hilar adenopathy/mass-EUS guided FNA Positive for malignancy, non small cell carcinoma, morphologically similar to previous right upper lobe squamous cell carcinoma.   ASSESSMENT & PLAN:  1. Squamous cell carcinoma of lung, right ([place])   2. Encounter for antineoplastic chemotherapy   3. Leukocytosis, unspecified type   4. Macrocytosis without anemia    # Recurrent Squamous cell carcinoma of lung,  Tolerates cycle 1 Carbo and Taxol. Labs are reviewed and discussed with patient. Counts acceptable to proceed with cycle 2 carboplatin and Taxol treatments. We spent sufficient time to discuss many aspect of care, questions were answered to patient's satisfaction. All questions were answered. The patient knows to call the clinic with any problems questions or concerns.  # Leukocytosis, predominantly neutrophilia. ikely reactive. Monitor.  # Macrocytosis, check vitamin b12 and folate at next visit.   Return of visit: 1 weeks.   Earlie Server, MD, PhD Hematology Oncology [place] at [facility]  01/24/2019

## 2019-01-24 NOTE — Progress Notes (Signed)
Patient ID: Samuel Choi, male   DOB: February 25, 1966, 53 y.o.   MRN: [id number] Patient came in for wound check and suture removal from port placement. Wound is clean with no signs of infection. Sutures removed and steri strips place.

## 2019-01-24 NOTE — Progress Notes (Signed)
Patient here today for follow up and chemotherapy.  Patient states no new concerns today

## 2019-01-24 NOTE — Progress Notes (Signed)
SCr improved from 0.98 to 0.76. This caused calculated carboplatin dose to increase beyond 10% from previous dose. Previous dose was 200mg , calculated dose is 240mg . Spoke with MD. Will keep previous dose of 200mg .

## 2019-01-27 ENCOUNTER — Ambulatory Visit
Admission: RE | Admit: 2019-01-27 | Discharge: 2019-01-27 | Disposition: A | Discharge status: Home / Self Care | Payer: BC Managed Care – PPO | Source: Ambulatory Visit | Attending: Radiation Oncology | Admitting: Radiation Oncology

## 2019-01-27 ENCOUNTER — Other Ambulatory Visit: Payer: Self-pay

## 2019-01-27 DIAGNOSIS — C3411 Malignant neoplasm of upper lobe, right bronchus or lung: Secondary | ICD-10-CM | POA: Diagnosis not present

## 2019-01-28 ENCOUNTER — Other Ambulatory Visit: Payer: Self-pay

## 2019-01-28 ENCOUNTER — Ambulatory Visit
Admission: RE | Admit: 2019-01-28 | Discharge: 2019-01-28 | Disposition: A | Discharge status: Home / Self Care | Payer: BC Managed Care – PPO | Source: Ambulatory Visit | Attending: Radiation Oncology | Admitting: Radiation Oncology

## 2019-01-28 DIAGNOSIS — C3411 Malignant neoplasm of upper lobe, right bronchus or lung: Secondary | ICD-10-CM | POA: Diagnosis not present

## 2019-01-29 ENCOUNTER — Ambulatory Visit
Admission: RE | Admit: 2019-01-29 | Discharge: 2019-01-29 | Disposition: A | Discharge status: Home / Self Care | Payer: BC Managed Care – PPO | Source: Ambulatory Visit | Attending: Radiation Oncology | Admitting: Radiation Oncology

## 2019-01-29 ENCOUNTER — Other Ambulatory Visit: Payer: Self-pay

## 2019-01-29 DIAGNOSIS — C3411 Malignant neoplasm of upper lobe, right bronchus or lung: Secondary | ICD-10-CM | POA: Diagnosis not present

## 2019-01-30 ENCOUNTER — Ambulatory Visit
Admission: RE | Admit: 2019-01-30 | Discharge: 2019-01-30 | Disposition: A | Discharge status: Home / Self Care | Payer: BC Managed Care – PPO | Source: Ambulatory Visit | Attending: Radiation Oncology | Admitting: Radiation Oncology

## 2019-01-30 ENCOUNTER — Other Ambulatory Visit: Payer: Self-pay

## 2019-01-30 DIAGNOSIS — C3411 Malignant neoplasm of upper lobe, right bronchus or lung: Secondary | ICD-10-CM | POA: Diagnosis not present

## 2019-01-31 ENCOUNTER — Inpatient Hospital Stay: Payer: BC Managed Care – PPO

## 2019-01-31 ENCOUNTER — Other Ambulatory Visit: Payer: Self-pay

## 2019-01-31 ENCOUNTER — Inpatient Hospital Stay: Payer: BC Managed Care – PPO | Admitting: *Deleted

## 2019-01-31 ENCOUNTER — Inpatient Hospital Stay (HOSPITAL_BASED_OUTPATIENT_CLINIC_OR_DEPARTMENT_OTHER): Payer: BC Managed Care – PPO | Admitting: Oncology

## 2019-01-31 ENCOUNTER — Ambulatory Visit
Admission: RE | Admit: 2019-01-31 | Discharge: 2019-01-31 | Disposition: A | Discharge status: Home / Self Care | Payer: BC Managed Care – PPO | Source: Ambulatory Visit | Attending: Radiation Oncology | Admitting: Radiation Oncology

## 2019-01-31 ENCOUNTER — Encounter: Payer: Self-pay | Admitting: Oncology

## 2019-01-31 VITALS — BP 122/73 | HR 77 | Temp 98.4°F | Resp 18 | Wt 139.0 lb

## 2019-01-31 DIAGNOSIS — Z87891 Personal history of nicotine dependence: Secondary | ICD-10-CM | POA: Diagnosis not present

## 2019-01-31 DIAGNOSIS — D7589 Other specified diseases of blood and blood-forming organs: Secondary | ICD-10-CM | POA: Diagnosis not present

## 2019-01-31 DIAGNOSIS — Z95828 Presence of other vascular implants and grafts: Secondary | ICD-10-CM

## 2019-01-31 DIAGNOSIS — C3411 Malignant neoplasm of upper lobe, right bronchus or lung: Secondary | ICD-10-CM | POA: Diagnosis not present

## 2019-01-31 DIAGNOSIS — C3491 Malignant neoplasm of unspecified part of right bronchus or lung: Secondary | ICD-10-CM

## 2019-01-31 DIAGNOSIS — Z5111 Encounter for antineoplastic chemotherapy: Secondary | ICD-10-CM | POA: Diagnosis not present

## 2019-01-31 LAB — CBC WITH DIFFERENTIAL/PLATELET
Abs Immature Granulocytes: 0.03 10*3/uL (ref 0.00–0.07)
Basophils Absolute: 0.1 10*3/uL (ref 0.0–0.1)
Basophils Relative: 1 %
Eosinophils Absolute: 0.1 10*3/uL (ref 0.0–0.5)
Eosinophils Relative: 1 %
HCT: 37.5 % — ABNORMAL LOW (ref 39.0–52.0)
Hemoglobin: 13.3 g/dL (ref 13.0–17.0)
Immature Granulocytes: 0 %
Lymphocytes Relative: 11 %
Lymphs Abs: 1.1 10*3/uL (ref 0.7–4.0)
MCH: 35.8 pg — ABNORMAL HIGH (ref 26.0–34.0)
MCHC: 35.5 g/dL (ref 30.0–36.0)
MCV: 100.8 fL — ABNORMAL HIGH (ref 80.0–100.0)
Monocytes Absolute: 0.6 10*3/uL (ref 0.1–1.0)
Monocytes Relative: 6 %
Neutro Abs: 8.1 10*3/uL — ABNORMAL HIGH (ref 1.7–7.7)
Neutrophils Relative %: 81 %
Platelets: 247 10*3/uL (ref 150–400)
RBC: 3.72 MIL/uL — ABNORMAL LOW (ref 4.22–5.81)
RDW: 11.8 % (ref 11.5–15.5)
WBC: 10.1 10*3/uL (ref 4.0–10.5)
nRBC: 0 % (ref 0.0–0.2)

## 2019-01-31 LAB — COMPREHENSIVE METABOLIC PANEL
ALT: 26 U/L (ref 0–44)
AST: 37 U/L (ref 15–41)
Albumin: 3.7 g/dL (ref 3.5–5.0)
Alkaline Phosphatase: 80 U/L (ref 38–126)
Anion gap: 13 (ref 5–15)
BUN: 13 mg/dL (ref 6–20)
CO2: 21 mmol/L — ABNORMAL LOW (ref 22–32)
Calcium: 8.9 mg/dL (ref 8.9–10.3)
Chloride: 101 mmol/L (ref 98–111)
Creatinine, Ser: 0.92 mg/dL (ref 0.61–1.24)
GFR calc Af Amer: >60 mL/min (ref >60)
GFR calc non Af Amer: >60 mL/min (ref >60)
Glucose, Bld: 128 mg/dL — ABNORMAL HIGH (ref 70–99)
Potassium: 3.6 mmol/L (ref 3.5–5.1)
Sodium: 135 mmol/L (ref 135–145)
Total Bilirubin: 0.6 mg/dL (ref 0.3–1.2)
Total Protein: 7.2 g/dL (ref 6.5–8.1)

## 2019-01-31 MED ORDER — PALONOSETRON HCL INJECTION 0.25 MG/5ML
0.2500 mg | Freq: Once | INTRAVENOUS | Status: AC
  Administered 2019-01-31: 0.25 mg via INTRAVENOUS
  Filled 2019-01-31: qty 5

## 2019-01-31 MED ORDER — SODIUM CHLORIDE 0.9 % IV SOLN
20.0000 mg | Freq: Once | INTRAVENOUS | Status: AC
  Administered 2019-01-31: 10:00:00 20 mg via INTRAVENOUS
  Filled 2019-01-31: qty 2

## 2019-01-31 MED ORDER — SODIUM CHLORIDE 0.9 % IV SOLN
Freq: Once | INTRAVENOUS | Status: AC
  Administered 2019-01-31: 09:00:00 via INTRAVENOUS
  Filled 2019-01-31: qty 250

## 2019-01-31 MED ORDER — SODIUM CHLORIDE 0.9 % IV SOLN
200.0000 mg | Freq: Once | INTRAVENOUS | Status: AC
  Administered 2019-01-31: 200 mg via INTRAVENOUS
  Filled 2019-01-31: qty 20

## 2019-01-31 MED ORDER — DIPHENHYDRAMINE HCL 50 MG/ML IJ SOLN
50.0000 mg | Freq: Once | INTRAMUSCULAR | Status: AC
  Administered 2019-01-31: 50 mg via INTRAVENOUS
  Filled 2019-01-31: qty 1

## 2019-01-31 MED ORDER — HEPARIN SOD (PORK) LOCK FLUSH 100 UNIT/ML IV SOLN
500.0000 [IU] | Freq: Once | INTRAVENOUS | Status: AC | PRN
  Administered 2019-01-31: 500 [IU]
  Filled 2019-01-31: qty 5

## 2019-01-31 MED ORDER — SODIUM CHLORIDE 0.9% FLUSH
10.0000 mL | Freq: Once | INTRAVENOUS | Status: AC
  Administered 2019-01-31: 10 mL via INTRAVENOUS
  Filled 2019-01-31: qty 10

## 2019-01-31 MED ORDER — FAMOTIDINE IN NACL 20-0.9 MG/50ML-% IV SOLN
20.0000 mg | Freq: Once | INTRAVENOUS | Status: AC
  Administered 2019-01-31: 20 mg via INTRAVENOUS
  Filled 2019-01-31: qty 50

## 2019-01-31 MED ORDER — SODIUM CHLORIDE 0.9 % IV SOLN
45.0000 mg/m2 | Freq: Once | INTRAVENOUS | Status: AC
  Administered 2019-01-31: 10:00:00 84 mg via INTRAVENOUS
  Filled 2019-01-31: qty 14

## 2019-01-31 NOTE — Progress Notes (Signed)
Hematology/Oncology Follow up note Samuel Choi Telephone:(336) [phone] Fax:(336) [phone]   Patient Care Team: Tracie Harrier, Samuel Choi as PCP - General (Internal Medicine) Samuel Nab, RN as Registered Nurse  REASON FOR VISIT:  Follow up for treatment of squamous lung cancer.   HISTORY OF PRESENTING ILLNESS:  # Dec 2019 Stage I squamous lung cancer #s/p Bronchoscopy on 06/25/2018. subcarina EBUS FNA was non diagnostic, hypocellular specimen.  # 08/13/2018 CT guided right upper lobe biopsy pathology showed dense fibrosis and mixed inflammatory cells with prominent polytypic plasma cells component. Focal benign bronchial wall and alveolar spaces. No malignancy was identified.   # 08/13/2018 underwent right thoracotomy and wedge resection of a right upper lobe mass.  Frozen section was consistent with an inflammatory process.  On the second postop day, preliminary pathology reports high-grade malignancy.  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  08/20/2018 Patient was therefore taken back to the OR and underwent take complete lobectomy  pT1b pN0 cM0 stage I squamous cell lung cancer.  Margin is negative.  Recommend observation.    # 12/03/2018 CT chest w contrast showed local recurrence.  12/10/2018 PET showed No evidence of distant metastatic disease # 12/26/2018 s/p bronchoscopy biopsy. Confirmed local recurrence of squamous cell lung cancer.   medi port placed by Dr.Oaks.  Cancer treatment Started concurrent chemoradiation on 01/16/2019 Carboplatin AUC of 2 and Taxol 45 mg per metered square weekly  INTERVAL HISTORY Samuel Choi is a 53 y.o. male who has above history reviewed by me today presents for evaluation prior to chemotherapy for treatment of  recurrent  squamous cell lung cancer. Status post 2 cycles carboplatin and Taxol concurrently with radiation Reports tolerating well.  No new complaints. Denies any fever, chills, nausea, vomiting, diarrhea,  abdominal pain, denies any neuropathy.   Review of Systems  Constitutional: Negative for appetite change, chills, fatigue, fever and unexpected weight change.  HENT:   Negative for hearing loss and voice change.   Eyes: Negative for eye problems and icterus.  Respiratory: Negative for chest tightness, cough and shortness of breath.   Cardiovascular: Negative for chest pain and leg swelling.  Gastrointestinal: Negative for abdominal distention and abdominal pain.  Endocrine: Negative for hot flashes.  Genitourinary: Negative for difficulty urinating, dysuria and frequency.   Musculoskeletal: Negative for arthralgias.  Skin: Negative for itching and rash.  Neurological: Negative for light-headedness and numbness.  Hematological: Negative for adenopathy. Does not bruise/bleed easily.  Psychiatric/Behavioral: Negative for confusion.    MEDICAL HISTORY:  Past Medical History:  Diagnosis Date  . Alcohol abuse    usually drinks 2-3 drinks per day  . Atherosclerosis 06/2018  . Chronic sinusitis   . Emphysema of lung ([place]) 06/2018   patient unaware of this.  . Hip fracture ([place]) 05/2018   no surgery  . History of kidney stones 05/2018   per xray, bilateral nephrolitiasis  . Hypertension   . Squamous cell carcinoma of lung, right ([place]) 06/2018    SURGICAL HISTORY: Past Surgical History:  Procedure Laterality Date  . BRAIN SURGERY  10/2017   nasal/sinus endoscopy. mass benign  . ELECTROMAGNETIC NAVIGATION BROCHOSCOPY Right 06/25/2018   Procedure: ELECTROMAGNETIC NAVIGATION BRONCHOSCOPY;  Surgeon: [name], Samuel Choi;  Location: [facility];  Service: Cardiopulmonary;  Laterality: Right;  . ENDOBRONCHIAL ULTRASOUND Right 06/25/2018   Procedure: ENDOBRONCHIAL ULTRASOUND;  Surgeon: [name], Samuel Choi;  Location: [facility];  Service: Cardiopulmonary;  Laterality: Right;  . ENDOBRONCHIAL ULTRASOUND Right 12/26/2018   Procedure: ENDOBRONCHIAL ULTRASOUND RIGHT;  Surgeon: [name], Samuel Choi;   Location: [facility];  Service: Cardiopulmonary;  Laterality: Right;  . FLEXIBLE BRONCHOSCOPY N/A 08/20/2018   Procedure: FLEXIBLE BRONCHOSCOPY PREOP;  Surgeon: [name], Samuel Choi;  Location: [facility];  Service: Thoracic;  Laterality: N/A;  . NASAL SINUS SURGERY  10/2017   At [facility], frontal sinusotomy, ethmoidectomy, resection anterior cranial fossa neoplasm, turbinate resection  . PORTACATH PLACEMENT Left 01/15/2019   Procedure: INSERTION PORT-A-CATH;  Surgeon: [name], Samuel Choi;  Location: [facility];  Service: General;  Laterality: Left;  . THORACOTOMY Right 08/13/2018   Procedure: PREOP BROCHOSCOPY WITH RIGHT THORACOTOMY AND RUL RESECTION;  Surgeon: [name], Samuel Choi;  Location: [facility];  Service: General;  Laterality: Right;  . THORACOTOMY Right 08/20/2018   Procedure: THORACOTOMY MAJOR RIGHT UPPER LOBE LOBECTOMY;  Surgeon: [name], Samuel Choi;  Location: [facility];  Service: Thoracic;  Laterality: Right;  . TOE SURGERY Left    pin in left toe    SOCIAL HISTORY: Social History   Socioeconomic History  . Marital status: Married    Spouse name: lisa  . Number of children: Not on file  . Years of education: Not on file  . Highest education level: Not on file  Occupational History  . 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 Financial resource strain: Not on file  . Food insecurity    Worry: Not on file    Inability: Not on file  . Transportation needs    Medical: Not on file    Non-medical: Not on file  Tobacco Use  . Smoking status: Former Smoker    Packs/day: 0.50    Years: 15.00    Pack years: 7.50    Types: Cigarettes    Quit date: 08/2018    Years since quitting: 0.4  . Smokeless tobacco: Never Used  Substance and Sexual Activity  . Alcohol use: Yes    Alcohol/week: 3.0 standard drinks    Types: 3 Cans of beer per week    Comment: usually 2 drinks per week, per patient  . Drug use: No  . Sexual activity: Not on file  Lifestyle  .  Physical activity    Days per week: Not on file    Minutes per session: Not on file  . Stress: Not on file  Relationships  . Social Herbalist on phone: Not on file    Gets together: Not on file    Attends religious service: Not on file    Active member of club or organization: Not on file    Attends meetings of clubs or organizations: Not on file    Relationship status: Not on file  . Intimate partner violence    Fear of current or ex partner: Not on file    Emotionally abused: Not on file    Physically abused: Not on file    Forced sexual activity: Not on file  Other Topics Concern  . Not on file  Social History Narrative  . Not on file    FAMILY HISTORY: Family History  Problem Relation Age of Onset  . Breast cancer Mother   . Diabetes Mother   . Lung cancer Father   . Hypertension Father     ALLERGIES:  has No Known Allergies.  MEDICATIONS:  Current Outpatient Medications  Medication Sig Dispense Refill  . amLODipine (NORVASC) 10 MG tablet Take 10 mg by mouth daily.     . cloNIDine (CATAPRES)  0.1 MG tablet Take 0.1 mg by mouth daily.   5  . hydrALAZINE (APRESOLINE) 100 MG tablet Take 50 mg by mouth 2 (two) times daily.     . hydrochlorothiazide (HYDRODIURIL) 12.5 MG tablet Take 12.5 mg by mouth daily.     Marland Kitchen lidocaine-prilocaine (EMLA) cream Apply to affected area once 30 g 3  . methocarbamol (ROBAXIN) 500 MG tablet Take 1 tablet (500 mg total) by mouth every 6 (six) hours. 30 tablet 0  . olmesartan (BENICAR) 40 MG tablet Take 40 mg by mouth daily.     . ondansetron (ZOFRAN) 8 MG tablet Take 1 tablet (8 mg total) by mouth 2 (two) times daily as needed for refractory nausea / vomiting. Start on day 3 after chemo. 30 tablet 1  . oxyCODONE-acetaminophen (PERCOCET) 5-325 MG tablet Take 1-2 tablets by mouth every 4 (four) hours as needed for severe pain. 20 tablet 0  . potassium chloride SA (K-DUR,KLOR-CON) 20 MEQ tablet Take 20 mEq by mouth 2 (two) times daily.     . prochlorperazine (COMPAZINE) 10 MG tablet Take 1 tablet (10 mg total) by mouth every 6 (six) hours as needed (Nausea or vomiting). 30 tablet 1   No current facility-administered medications for this visit.    Facility-Administered Medications Ordered in Other Visits  Medication Dose Route Frequency Provider Last Rate Last Dose  . albuterol (PROVENTIL) (2.5 MG/3ML) 0.083% nebulizer solution 2.5 mg  2.5 mg Nebulization Once System, Provider Not In         PHYSICAL EXAMINATION: ECOG PERFORMANCE STATUS: 0 - Asymptomatic Vitals:   01/31/19 0833  BP: 122/73  Pulse: 77  Resp: 18  Temp: 98.4 F (36.9 C)  SpO2: 97%   Filed Weights   01/31/19 0833  Weight: 139 lb (63 kg)    Physical Exam Constitutional:      General: He is not in acute distress.    Comments: Thin  HENT:     Head: Normocephalic and atraumatic.  Eyes:     General: No scleral icterus.    Pupils: Pupils are equal, round, and reactive to light.  Neck:     Musculoskeletal: Normal range of motion and neck supple.  Cardiovascular:     Rate and Rhythm: Normal rate and regular rhythm.     Heart sounds: Normal heart sounds.  Pulmonary:     Effort: Pulmonary effort is normal. No respiratory distress.     Breath sounds: No wheezing.  Abdominal:     General: Bowel sounds are normal. There is no distension.     Palpations: Abdomen is soft. There is no mass.     Tenderness: There is no abdominal tenderness.  Musculoskeletal: Normal range of motion.        General: No deformity.  Skin:    General: Skin is warm and dry.     Findings: No erythema or rash.  Neurological:     Mental Status: He is alert and oriented to person, place, and time.     Cranial Nerves: No cranial nerve deficit.     Coordination: Coordination normal.  Psychiatric:        Behavior: Behavior normal.        Thought Content: Thought content normal.      LABORATORY DATA:  I have reviewed the data as listed Lab Results  Component Value Date    WBC 10.1 01/31/2019   HGB 13.3 01/31/2019   HCT 37.5 (L) 01/31/2019   MCV 100.8 (H) 01/31/2019   PLT 247  01/31/2019   Recent Labs    01/17/19 0757 01/24/19 0911 01/31/19 0757  NA 137 138 135  K 4.1 4.2 3.6  CL 101 103 101  CO2 25 24 21*  GLUCOSE 101* 91 128*  BUN _0 CREATININE 0.98 0.76 0.92  CALCIUM 9.4 9.1 8.9  GFRNONAA >60 >60 >60  GFRAA >60 >60 >60  PROT 7.6 7.7 7.2  ALBUMIN 3.9 3.9 3.7  AST 38 31 37  ALT _1 ALKPHOS 81 77 80  BILITOT 0.4 0.5 0.6   Iron/TIBC/Ferritin/ %Sat No results found for: IRON, TIBC, FERRITIN, IRONPCTSAT   Pathology 12/26/2018 Right hilar adenopathy/mass-EUS guided FNA Positive for malignancy, non small cell carcinoma, morphologically similar to previous right upper lobe squamous cell carcinoma.   ASSESSMENT & PLAN:  1. Squamous cell carcinoma of lung, right ([place])   2. Encounter for antineoplastic chemotherapy   3. Macrocytosis without anemia    # Recurrent Squamous cell carcinoma of lung,  Tolerates 2 cycles of weekly Carbo and Taxol. Labs reviewed and discussed with patient .  Counts acceptable to proceed with cycle 3 carboplatin Taxol weekly treatment . #Macrocytosis, monitor. Labs are reviewed and discussed with patient. Counts acceptable to proceed with cycle 2 carboplatin and Taxol treatments. We spent sufficient time to discuss many aspect of care, questions were answered to patient's satisfaction. All questions were answered. The patient knows to call the clinic with any problems questions or concerns.  # Leukocytosis, resolved. # Macrocytosis, check vitamin b12 and folate at next visit.   Return of visit: 1 weeks.  Orders Placed This Encounter  Procedures  . Vitamin B12    Standing Status:   Future    Standing Expiration Date:   01/31/2020  . Folate    Standing Status:   Future    Standing Expiration Date:   01/31/2020    Earlie Server, MD, PhD Hematology Oncology [place] [facility] 01/31/2019

## 2019-01-31 NOTE — Progress Notes (Signed)
Pt here for follow up. Denies any concerns.  

## 2019-02-03 ENCOUNTER — Other Ambulatory Visit: Payer: Self-pay

## 2019-02-03 ENCOUNTER — Ambulatory Visit
Admission: RE | Admit: 2019-02-03 | Discharge: 2019-02-03 | Disposition: A | Discharge status: Home / Self Care | Payer: BC Managed Care – PPO | Source: Ambulatory Visit | Attending: Radiation Oncology | Admitting: Radiation Oncology

## 2019-02-03 DIAGNOSIS — C3411 Malignant neoplasm of upper lobe, right bronchus or lung: Secondary | ICD-10-CM | POA: Diagnosis not present

## 2019-02-04 ENCOUNTER — Other Ambulatory Visit: Payer: Self-pay

## 2019-02-04 ENCOUNTER — Ambulatory Visit: Payer: BC Managed Care – PPO

## 2019-02-04 ENCOUNTER — Ambulatory Visit
Admission: RE | Admit: 2019-02-04 | Discharge: 2019-02-04 | Disposition: A | Discharge status: Home / Self Care | Payer: BC Managed Care – PPO | Source: Ambulatory Visit | Attending: Radiation Oncology | Admitting: Radiation Oncology

## 2019-02-04 DIAGNOSIS — C3411 Malignant neoplasm of upper lobe, right bronchus or lung: Secondary | ICD-10-CM | POA: Diagnosis not present

## 2019-02-05 ENCOUNTER — Ambulatory Visit
Admission: RE | Admit: 2019-02-05 | Discharge: 2019-02-05 | Disposition: A | Discharge status: Home / Self Care | Payer: BC Managed Care – PPO | Source: Ambulatory Visit | Attending: Radiation Oncology | Admitting: Radiation Oncology

## 2019-02-05 ENCOUNTER — Other Ambulatory Visit: Payer: Self-pay

## 2019-02-05 DIAGNOSIS — C3411 Malignant neoplasm of upper lobe, right bronchus or lung: Secondary | ICD-10-CM | POA: Diagnosis not present

## 2019-02-06 ENCOUNTER — Ambulatory Visit
Admission: RE | Admit: 2019-02-06 | Discharge: 2019-02-06 | Disposition: A | Discharge status: Home / Self Care | Payer: BC Managed Care – PPO | Source: Ambulatory Visit | Attending: Radiation Oncology | Admitting: Radiation Oncology

## 2019-02-06 ENCOUNTER — Other Ambulatory Visit: Payer: Self-pay

## 2019-02-06 DIAGNOSIS — C3411 Malignant neoplasm of upper lobe, right bronchus or lung: Secondary | ICD-10-CM | POA: Diagnosis not present

## 2019-02-07 ENCOUNTER — Inpatient Hospital Stay (HOSPITAL_BASED_OUTPATIENT_CLINIC_OR_DEPARTMENT_OTHER): Payer: BC Managed Care – PPO | Admitting: Oncology

## 2019-02-07 ENCOUNTER — Inpatient Hospital Stay: Payer: BC Managed Care – PPO

## 2019-02-07 ENCOUNTER — Ambulatory Visit
Admission: RE | Admit: 2019-02-07 | Discharge: 2019-02-07 | Disposition: A | Discharge status: Home / Self Care | Payer: BC Managed Care – PPO | Source: Ambulatory Visit | Attending: Radiation Oncology | Admitting: Radiation Oncology

## 2019-02-07 ENCOUNTER — Encounter: Payer: Self-pay | Admitting: Oncology

## 2019-02-07 ENCOUNTER — Other Ambulatory Visit: Payer: Self-pay

## 2019-02-07 VITALS — BP 84/58 | HR 106 | Temp 97.5°F | Wt 139.5 lb

## 2019-02-07 VITALS — BP 112/73 | HR 95

## 2019-02-07 DIAGNOSIS — C3411 Malignant neoplasm of upper lobe, right bronchus or lung: Secondary | ICD-10-CM | POA: Diagnosis not present

## 2019-02-07 DIAGNOSIS — D7589 Other specified diseases of blood and blood-forming organs: Secondary | ICD-10-CM

## 2019-02-07 DIAGNOSIS — E860 Dehydration: Secondary | ICD-10-CM

## 2019-02-07 DIAGNOSIS — C3491 Malignant neoplasm of unspecified part of right bronchus or lung: Secondary | ICD-10-CM

## 2019-02-07 DIAGNOSIS — E538 Deficiency of other specified B group vitamins: Secondary | ICD-10-CM

## 2019-02-07 DIAGNOSIS — I959 Hypotension, unspecified: Secondary | ICD-10-CM | POA: Diagnosis not present

## 2019-02-07 DIAGNOSIS — Z5111 Encounter for antineoplastic chemotherapy: Secondary | ICD-10-CM | POA: Diagnosis not present

## 2019-02-07 DIAGNOSIS — Z87891 Personal history of nicotine dependence: Secondary | ICD-10-CM

## 2019-02-07 HISTORY — DX: Dehydration: E86.0

## 2019-02-07 LAB — CBC WITH DIFFERENTIAL/PLATELET
Abs Immature Granulocytes: 0.03 10*3/uL (ref 0.00–0.07)
Basophils Absolute: 0.1 10*3/uL (ref 0.0–0.1)
Basophils Relative: 1 %
Eosinophils Absolute: 0.2 10*3/uL (ref 0.0–0.5)
Eosinophils Relative: 3 %
HCT: 34.2 % — ABNORMAL LOW (ref 39.0–52.0)
Hemoglobin: 12.5 g/dL — ABNORMAL LOW (ref 13.0–17.0)
Immature Granulocytes: 0 %
Lymphocytes Relative: 16 %
Lymphs Abs: 1.1 10*3/uL (ref 0.7–4.0)
MCH: 36.7 pg — ABNORMAL HIGH (ref 26.0–34.0)
MCHC: 36.5 g/dL — ABNORMAL HIGH (ref 30.0–36.0)
MCV: 100.3 fL — ABNORMAL HIGH (ref 80.0–100.0)
Monocytes Absolute: 0.4 10*3/uL (ref 0.1–1.0)
Monocytes Relative: 6 %
Neutro Abs: 5.2 10*3/uL (ref 1.7–7.7)
Neutrophils Relative %: 74 %
Platelets: 170 10*3/uL (ref 150–400)
RBC: 3.41 MIL/uL — ABNORMAL LOW (ref 4.22–5.81)
RDW: 11.9 % (ref 11.5–15.5)
WBC: 7.1 10*3/uL (ref 4.0–10.5)
nRBC: 0 % (ref 0.0–0.2)

## 2019-02-07 LAB — COMPREHENSIVE METABOLIC PANEL
ALT: 19 U/L (ref 0–44)
AST: 27 U/L (ref 15–41)
Albumin: 3.4 g/dL — ABNORMAL LOW (ref 3.5–5.0)
Alkaline Phosphatase: 71 U/L (ref 38–126)
Anion gap: 12 (ref 5–15)
BUN: 9 mg/dL (ref 6–20)
CO2: 23 mmol/L (ref 22–32)
Calcium: 8.8 mg/dL — ABNORMAL LOW (ref 8.9–10.3)
Chloride: 102 mmol/L (ref 98–111)
Creatinine, Ser: 0.88 mg/dL (ref 0.61–1.24)
GFR calc Af Amer: >60 mL/min (ref >60)
GFR calc non Af Amer: >60 mL/min (ref >60)
Glucose, Bld: 111 mg/dL — ABNORMAL HIGH (ref 70–99)
Potassium: 3.5 mmol/L (ref 3.5–5.1)
Sodium: 137 mmol/L (ref 135–145)
Total Bilirubin: 0.5 mg/dL (ref 0.3–1.2)
Total Protein: 6.5 g/dL (ref 6.5–8.1)

## 2019-02-07 LAB — FOLATE: Folate: 3.9 ng/mL — ABNORMAL LOW (ref >5.9)

## 2019-02-07 LAB — VITAMIN B12: Vitamin B-12: 127 pg/mL — ABNORMAL LOW (ref 180–914)

## 2019-02-07 MED ORDER — SODIUM CHLORIDE 0.9 % IV SOLN
200.0000 mg | Freq: Once | INTRAVENOUS | Status: AC
  Administered 2019-02-07: 13:00:00 200 mg via INTRAVENOUS
  Filled 2019-02-07: qty 20

## 2019-02-07 MED ORDER — SODIUM CHLORIDE 0.9 % IV SOLN
20.0000 mg | Freq: Once | INTRAVENOUS | Status: AC
  Administered 2019-02-07: 20 mg via INTRAVENOUS
  Filled 2019-02-07: qty 2

## 2019-02-07 MED ORDER — HEPARIN SOD (PORK) LOCK FLUSH 100 UNIT/ML IV SOLN
500.0000 [IU] | Freq: Once | INTRAVENOUS | Status: AC
  Administered 2019-02-07: 500 [IU] via INTRAVENOUS
  Filled 2019-02-07: qty 5

## 2019-02-07 MED ORDER — SODIUM CHLORIDE 0.9 % IV SOLN
45.0000 mg/m2 | Freq: Once | INTRAVENOUS | Status: AC
  Administered 2019-02-07: 84 mg via INTRAVENOUS
  Filled 2019-02-07: qty 14

## 2019-02-07 MED ORDER — DIPHENHYDRAMINE HCL 50 MG/ML IJ SOLN
50.0000 mg | Freq: Once | INTRAMUSCULAR | Status: AC
  Administered 2019-02-07: 50 mg via INTRAVENOUS
  Filled 2019-02-07: qty 1

## 2019-02-07 MED ORDER — SODIUM CHLORIDE 0.9% FLUSH
10.0000 mL | INTRAVENOUS | Status: DC | PRN
  Administered 2019-02-07: 10 mL via INTRAVENOUS
  Filled 2019-02-07: qty 10

## 2019-02-07 MED ORDER — SODIUM CHLORIDE 0.9 % IV SOLN
Freq: Once | INTRAVENOUS | Status: AC
  Administered 2019-02-07: 10:00:00 via INTRAVENOUS
  Filled 2019-02-07: qty 250

## 2019-02-07 MED ORDER — PALONOSETRON HCL INJECTION 0.25 MG/5ML
0.2500 mg | Freq: Once | INTRAVENOUS | Status: AC
  Administered 2019-02-07: 10:00:00 0.25 mg via INTRAVENOUS
  Filled 2019-02-07: qty 5

## 2019-02-07 MED ORDER — FOLIC ACID 400 MCG PO TABS: 400.0000 ug | ORAL_TABLET | Freq: Every day | ORAL | 1 refills | Status: DC

## 2019-02-07 MED ORDER — FAMOTIDINE IN NACL 20-0.9 MG/50ML-% IV SOLN
20.0000 mg | Freq: Once | INTRAVENOUS | Status: AC
  Administered 2019-02-07: 20 mg via INTRAVENOUS
  Filled 2019-02-07: qty 50

## 2019-02-07 NOTE — Progress Notes (Signed)
Proceed with BP today per MD

## 2019-02-07 NOTE — Progress Notes (Signed)
Patient here today for follow up.  Patient states no new concerns today  

## 2019-02-07 NOTE — Progress Notes (Signed)
Hematology/Oncology Follow up note [facility] Telephone:(336) [phone] Fax:(336) [phone]   Patient Care Team: Tracie Harrier, MD as PCP - General (Internal Medicine) [name], RN as Registered Nurse  REASON FOR VISIT:  Follow up for treatment of squamous lung cancer.   HISTORY OF PRESENTING ILLNESS:  # Dec 2019 Stage I squamous lung cancer #s/p Bronchoscopy on 06/25/2018. subcarina EBUS FNA was non diagnostic, hypocellular specimen.  # 08/13/2018 CT guided right upper lobe biopsy pathology showed dense fibrosis and mixed inflammatory cells with prominent polytypic plasma cells component. Focal benign bronchial wall and alveolar spaces. No malignancy was identified.   # 08/13/2018 underwent right thoracotomy and wedge resection of a right upper lobe mass.  Frozen section was consistent with an inflammatory process.  On the second postop day, preliminary pathology reports high-grade malignancy.  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  08/20/2018 Patient was therefore taken back to the OR and underwent take complete lobectomy  pT1b pN0 cM0 stage I squamous cell lung cancer.  Margin is negative.  Recommend observation.    # 12/03/2018 CT chest w contrast showed local recurrence.  12/10/2018 PET showed No evidence of distant metastatic disease # 12/26/2018 s/p bronchoscopy biopsy. Confirmed local recurrence of squamous cell lung cancer.   medi port placed by Dr.Oaks.  Cancer treatment Started concurrent chemoradiation on 01/16/2019 Carboplatin AUC of 2 and Taxol 45 mg per metered square weekly  INTERVAL HISTORY Samuel Choi is a 53 y.o. male who has above history reviewed by me today presents for evaluation prior to chemotherapy for treatment of  recurrent  squamous cell lung cancer. Status post 3 cycles carboplatin and Taxol concurrently with radiation Reports tolerating well.  No new complaints.  Blood pressure was low in the clinic. Admits to not  drinking much fluid.  Denies any dizziness or lightheadness.  Denies any fever, chills, nausea, vomiting, diarrhea, abdominal pain, denies any neuropathy.   Review of Systems  Constitutional: Negative for appetite change, chills, fatigue, fever and unexpected weight change.  HENT:   Negative for hearing loss and voice change.   Eyes: Negative for eye problems and icterus.  Respiratory: Negative for chest tightness, cough and shortness of breath.   Cardiovascular: Negative for chest pain and leg swelling.  Gastrointestinal: Negative for abdominal distention and abdominal pain.  Endocrine: Negative for hot flashes.  Genitourinary: Negative for difficulty urinating, dysuria and frequency.   Musculoskeletal: Negative for arthralgias.  Skin: Negative for itching and rash.  Neurological: Negative for light-headedness and numbness.  Hematological: Negative for adenopathy. Does not bruise/bleed easily.  Psychiatric/Behavioral: Negative for confusion.    MEDICAL HISTORY:  Past Medical History:  Diagnosis Date   Alcohol abuse    usually drinks 2-3 drinks per day   Atherosclerosis 06/2018   Chronic sinusitis    Dehydration 02/07/2019   Emphysema of lung ([place]) 06/2018   patient unaware of this.   Hip fracture ([place]) 05/2018   no surgery   History of kidney stones 05/2018   per xray, bilateral nephrolitiasis   Hypertension    Squamous cell carcinoma of lung, right ([place]) 06/2018    SURGICAL HISTORY: Past Surgical History:  Procedure Laterality Date   BRAIN SURGERY  10/2017   nasal/sinus endoscopy. mass benign   ELECTROMAGNETIC NAVIGATION BROCHOSCOPY Right 06/25/2018   Procedure: ELECTROMAGNETIC NAVIGATION BRONCHOSCOPY;  Surgeon: [name], MD;  Location: [facility];  Service: Cardiopulmonary;  Laterality: Right;   ENDOBRONCHIAL ULTRASOUND Right 06/25/2018   Procedure: ENDOBRONCHIAL ULTRASOUND;  Surgeon:  [name], MD;  Location: [facility];  Service: Cardiopulmonary;   Laterality: Right;   ENDOBRONCHIAL ULTRASOUND Right 12/26/2018   Procedure: ENDOBRONCHIAL ULTRASOUND RIGHT;  Surgeon: [name], MD;  Location: [facility];  Service: Cardiopulmonary;  Laterality: Right;   FLEXIBLE BRONCHOSCOPY N/A 08/20/2018   Procedure: FLEXIBLE BRONCHOSCOPY PREOP;  Surgeon: [name], MD;  Location: [facility];  Service: Thoracic;  Laterality: N/A;   NASAL SINUS SURGERY  10/2017   At [facility], frontal sinusotomy, ethmoidectomy, resection anterior cranial fossa neoplasm, turbinate resection   PORTACATH PLACEMENT Left 01/15/2019   Procedure: INSERTION PORT-A-CATH;  Surgeon: [name], MD;  Location: [facility];  Service: General;  Laterality: Left;   THORACOTOMY Right 08/13/2018   Procedure: PREOP BROCHOSCOPY WITH RIGHT THORACOTOMY AND RUL RESECTION;  Surgeon: [name], MD;  Location: [facility];  Service: General;  Laterality: Right;   THORACOTOMY Right 08/20/2018   Procedure: THORACOTOMY MAJOR RIGHT UPPER LOBE LOBECTOMY;  Surgeon: [name], MD;  Location: [facility];  Service: Thoracic;  Laterality: Right;   TOE SURGERY Left    pin in left toe    SOCIAL HISTORY: Social History   Socioeconomic History   Marital status: Married    Spouse name: lisa   Number of children: Not on file   Years of education: Not on file   Highest education level: Not on file  Occupational History   Occupation: welding    Comment: taking time off to resolve issues  Social Designer, fashion/clothing strain: Not on file   Food insecurity    Worry: Not on file    Inability: Not on file   Transportation needs    Medical: Not on file    Non-medical: Not on file  Tobacco Use   Smoking status: Former Smoker    Packs/day: 0.50    Years: 15.00    Pack years: 7.50    Types: Cigarettes    Quit date: 08/2018    Years since quitting: 0.4   Smokeless tobacco: Never Used  Substance and Sexual Activity   Alcohol use: Yes    Alcohol/week: 3.0 standard drinks    Types: 3  Cans of beer per week    Comment: usually 2 drinks per week, per patient   Drug use: No   Sexual activity: Not on file  Lifestyle   Physical activity    Days per week: Not on file    Minutes per session: Not on file   Stress: Not on file  Relationships   Social connections    Talks on phone: Not on file    Gets together: Not on file    Attends religious service: Not on file    Active member of club or organization: Not on file    Attends meetings of clubs or organizations: Not on file    Relationship status: Not on file   Intimate partner violence    Fear of current or ex partner: Not on file    Emotionally abused: Not on file    Physically abused: Not on file    Forced sexual activity: Not on file  Other Topics Concern   Not on file  Social History Narrative   Not on file    FAMILY HISTORY: Family History  Problem Relation Age of Onset   Breast cancer Mother    Diabetes Mother    Lung cancer Father    Hypertension Father     ALLERGIES:  has No Known Allergies.  MEDICATIONS:  Current Outpatient  Medications  Medication Sig Dispense Refill   amLODipine (NORVASC) 10 MG tablet Take 10 mg by mouth daily.      cloNIDine (CATAPRES) 0.1 MG tablet Take 0.1 mg by mouth daily.   5   hydrALAZINE (APRESOLINE) 100 MG tablet Take 50 mg by mouth 2 (two) times daily.      hydrochlorothiazide (HYDRODIURIL) 12.5 MG tablet Take 12.5 mg by mouth daily.      lidocaine-prilocaine (EMLA) cream Apply to affected area once 30 g 3   methocarbamol (ROBAXIN) 500 MG tablet Take 1 tablet (500 mg total) by mouth every 6 (six) hours. 30 tablet 0   olmesartan (BENICAR) 40 MG tablet Take 40 mg by mouth daily.      ondansetron (ZOFRAN) 8 MG tablet Take 1 tablet (8 mg total) by mouth 2 (two) times daily as needed for refractory nausea / vomiting. Start on day 3 after chemo. 30 tablet 1   oxyCODONE-acetaminophen (PERCOCET) 5-325 MG tablet Take 1-2 tablets by mouth every 4 (four)  hours as needed for severe pain. 20 tablet 0   potassium chloride SA (K-DUR,KLOR-CON) 20 MEQ tablet Take 20 mEq by mouth 2 (two) times daily.     prochlorperazine (COMPAZINE) 10 MG tablet Take 1 tablet (10 mg total) by mouth every 6 (six) hours as needed (Nausea or vomiting). 30 tablet 1   folic acid (V-R FOLIC ACID) 559 MCG tablet Take 1 tablet (400 mcg total) by mouth daily. 90 tablet 1   No current facility-administered medications for this visit.    Facility-Administered Medications Ordered in Other Visits  Medication Dose Route Frequency Provider Last Rate Last Dose   albuterol (PROVENTIL) (2.5 MG/3ML) 0.083% nebulizer solution 2.5 mg  2.5 mg Nebulization Once System, Provider Not In         PHYSICAL EXAMINATION: ECOG PERFORMANCE STATUS: 0 - Asymptomatic Vitals:   02/07/19 0849  BP: (!) 84/58  Pulse: (!) 106  Temp: (!) 97.5 F (36.4 C)   Filed Weights   02/07/19 0849  Weight: 139 lb 8 oz (63.3 kg)    Physical Exam Constitutional:      General: He is not in acute distress.    Comments: Thin  HENT:     Head: Normocephalic and atraumatic.  Eyes:     General: No scleral icterus.    Pupils: Pupils are equal, round, and reactive to light.  Neck:     Musculoskeletal: Normal range of motion and neck supple.  Cardiovascular:     Rate and Rhythm: Normal rate and regular rhythm.     Heart sounds: Normal heart sounds.  Pulmonary:     Effort: Pulmonary effort is normal. No respiratory distress.     Breath sounds: No wheezing.  Abdominal:     General: Bowel sounds are normal. There is no distension.     Palpations: Abdomen is soft. There is no mass.     Tenderness: There is no abdominal tenderness.  Musculoskeletal: Normal range of motion.        General: No deformity.  Skin:    General: Skin is warm and dry.     Findings: No erythema or rash.  Neurological:     Mental Status: He is alert and oriented to person, place, and time.     Cranial Nerves: No cranial nerve  deficit.     Coordination: Coordination normal.  Psychiatric:        Behavior: Behavior normal.        Thought Content: Thought content normal.  LABORATORY DATA:  I have reviewed the data as listed Lab Results  Component Value Date   WBC 7.1 02/07/2019   HGB 12.5 (L) 02/07/2019   HCT 34.2 (L) 02/07/2019   MCV 100.3 (H) 02/07/2019   PLT 170 02/07/2019   Recent Labs    01/24/19 0911 01/31/19 0757 02/07/19 0816  NA 138 135 137  K 4.2 3.6 3.5  CL 103 101 102  CO2 24 21* 23  GLUCOSE 91 128* 111*  BUN _0 CREATININE 0.76 0.92 0.88  CALCIUM 9.1 8.9 8.8*  GFRNONAA >60 >60 >60  GFRAA >60 >60 >60  PROT 7.7 7.2 6.5  ALBUMIN 3.9 3.7 3.4*  AST 31 37 27  ALT _1 ALKPHOS 77 80 71  BILITOT 0.5 0.6 0.5   Iron/TIBC/Ferritin/ %Sat No results found for: IRON, TIBC, FERRITIN, IRONPCTSAT   Pathology 12/26/2018 Right hilar adenopathy/mass-EUS guided FNA Positive for malignancy, non small cell carcinoma, morphologically similar to previous right upper lobe squamous cell carcinoma.   ASSESSMENT & PLAN:  1. Encounter for antineoplastic chemotherapy   2. Dehydration   3. Squamous cell carcinoma of lung, right ([place])   4. Macrocytosis without anemia   5. Folate deficiency    # Recurrent Squamous cell carcinoma of lung,  Tolerates 3 cycles of weekly Carbo and Taxol. Labs are reviewed and discussed with patient. Counts are acceptable to proceed with cycle 4 Carboplatin and Taxol weekly treatment.   # Hypotension, likely due to dehydration.  Recommend 1L of normal saline over one hour.   # Macrocytosis, checked folate level and B12 level.  # Folate deficiency, recommend patient to start oral folate supplementation.   Return of visit: 1 weeks.  Earlie Server, MD, PhD Hematology Oncology [place] at [facility] 02/07/2019

## 2019-02-10 ENCOUNTER — Ambulatory Visit
Admission: RE | Admit: 2019-02-10 | Discharge: 2019-02-10 | Disposition: A | Discharge status: Home / Self Care | Payer: BC Managed Care – PPO | Source: Ambulatory Visit | Attending: Radiation Oncology | Admitting: Radiation Oncology

## 2019-02-10 ENCOUNTER — Other Ambulatory Visit: Payer: Self-pay

## 2019-02-10 DIAGNOSIS — C3411 Malignant neoplasm of upper lobe, right bronchus or lung: Secondary | ICD-10-CM | POA: Diagnosis not present

## 2019-02-10 DIAGNOSIS — Z510 Encounter for antineoplastic radiation therapy: Secondary | ICD-10-CM | POA: Diagnosis not present

## 2019-02-10 DIAGNOSIS — Z87891 Personal history of nicotine dependence: Secondary | ICD-10-CM | POA: Diagnosis not present

## 2019-02-11 ENCOUNTER — Ambulatory Visit
Admission: RE | Admit: 2019-02-11 | Discharge: 2019-02-11 | Disposition: A | Discharge status: Home / Self Care | Payer: BC Managed Care – PPO | Source: Ambulatory Visit | Attending: Radiation Oncology | Admitting: Radiation Oncology

## 2019-02-11 ENCOUNTER — Other Ambulatory Visit: Payer: Self-pay

## 2019-02-11 DIAGNOSIS — C3411 Malignant neoplasm of upper lobe, right bronchus or lung: Secondary | ICD-10-CM | POA: Diagnosis not present

## 2019-02-12 ENCOUNTER — Other Ambulatory Visit: Payer: Self-pay

## 2019-02-12 ENCOUNTER — Ambulatory Visit
Admission: RE | Admit: 2019-02-12 | Discharge: 2019-02-12 | Disposition: A | Discharge status: Home / Self Care | Payer: BC Managed Care – PPO | Source: Ambulatory Visit | Attending: Radiation Oncology | Admitting: Radiation Oncology

## 2019-02-12 DIAGNOSIS — C3411 Malignant neoplasm of upper lobe, right bronchus or lung: Secondary | ICD-10-CM | POA: Diagnosis not present

## 2019-02-13 ENCOUNTER — Ambulatory Visit
Admission: RE | Admit: 2019-02-13 | Discharge: 2019-02-13 | Disposition: A | Discharge status: Home / Self Care | Payer: BC Managed Care – PPO | Source: Ambulatory Visit | Attending: Radiation Oncology | Admitting: Radiation Oncology

## 2019-02-13 ENCOUNTER — Other Ambulatory Visit: Payer: Self-pay

## 2019-02-13 DIAGNOSIS — C3411 Malignant neoplasm of upper lobe, right bronchus or lung: Secondary | ICD-10-CM | POA: Diagnosis not present

## 2019-02-14 ENCOUNTER — Inpatient Hospital Stay: Payer: BC Managed Care – PPO | Attending: Oncology

## 2019-02-14 ENCOUNTER — Encounter: Payer: Self-pay | Admitting: Oncology

## 2019-02-14 ENCOUNTER — Inpatient Hospital Stay (HOSPITAL_BASED_OUTPATIENT_CLINIC_OR_DEPARTMENT_OTHER): Payer: BC Managed Care – PPO | Admitting: Oncology

## 2019-02-14 ENCOUNTER — Ambulatory Visit
Admission: RE | Admit: 2019-02-14 | Discharge: 2019-02-14 | Disposition: A | Discharge status: Home / Self Care | Payer: BC Managed Care – PPO | Source: Ambulatory Visit | Attending: Radiation Oncology | Admitting: Radiation Oncology

## 2019-02-14 ENCOUNTER — Inpatient Hospital Stay: Payer: BC Managed Care – PPO

## 2019-02-14 ENCOUNTER — Other Ambulatory Visit: Payer: Self-pay

## 2019-02-14 VITALS — BP 153/90 | HR 99 | Temp 95.8°F | Resp 16 | Wt 140.1 lb

## 2019-02-14 VITALS — HR 90

## 2019-02-14 DIAGNOSIS — E538 Deficiency of other specified B group vitamins: Secondary | ICD-10-CM

## 2019-02-14 DIAGNOSIS — C3491 Malignant neoplasm of unspecified part of right bronchus or lung: Secondary | ICD-10-CM

## 2019-02-14 DIAGNOSIS — E876 Hypokalemia: Secondary | ICD-10-CM

## 2019-02-14 DIAGNOSIS — Z803 Family history of malignant neoplasm of breast: Secondary | ICD-10-CM | POA: Diagnosis not present

## 2019-02-14 DIAGNOSIS — Z79899 Other long term (current) drug therapy: Secondary | ICD-10-CM | POA: Insufficient documentation

## 2019-02-14 DIAGNOSIS — E860 Dehydration: Secondary | ICD-10-CM | POA: Diagnosis not present

## 2019-02-14 DIAGNOSIS — Z95828 Presence of other vascular implants and grafts: Secondary | ICD-10-CM

## 2019-02-14 DIAGNOSIS — J439 Emphysema, unspecified: Secondary | ICD-10-CM | POA: Insufficient documentation

## 2019-02-14 DIAGNOSIS — Z87891 Personal history of nicotine dependence: Secondary | ICD-10-CM | POA: Insufficient documentation

## 2019-02-14 DIAGNOSIS — C3411 Malignant neoplasm of upper lobe, right bronchus or lung: Secondary | ICD-10-CM | POA: Diagnosis not present

## 2019-02-14 DIAGNOSIS — I10 Essential (primary) hypertension: Secondary | ICD-10-CM | POA: Diagnosis not present

## 2019-02-14 DIAGNOSIS — Z5111 Encounter for antineoplastic chemotherapy: Secondary | ICD-10-CM | POA: Insufficient documentation

## 2019-02-14 LAB — CBC WITH DIFFERENTIAL/PLATELET
Abs Immature Granulocytes: 0.04 10*3/uL (ref 0.00–0.07)
Basophils Absolute: 0 10*3/uL (ref 0.0–0.1)
Basophils Relative: 1 %
Eosinophils Absolute: 0.1 10*3/uL (ref 0.0–0.5)
Eosinophils Relative: 2 %
HCT: 32.1 % — ABNORMAL LOW (ref 39.0–52.0)
Hemoglobin: 11.5 g/dL — ABNORMAL LOW (ref 13.0–17.0)
Immature Granulocytes: 1 %
Lymphocytes Relative: 14 %
Lymphs Abs: 0.9 10*3/uL (ref 0.7–4.0)
MCH: 36.4 pg — ABNORMAL HIGH (ref 26.0–34.0)
MCHC: 35.8 g/dL (ref 30.0–36.0)
MCV: 101.6 fL — ABNORMAL HIGH (ref 80.0–100.0)
Monocytes Absolute: 0.4 10*3/uL (ref 0.1–1.0)
Monocytes Relative: 6 %
Neutro Abs: 4.9 10*3/uL (ref 1.7–7.7)
Neutrophils Relative %: 76 %
Platelets: 119 10*3/uL — ABNORMAL LOW (ref 150–400)
RBC: 3.16 MIL/uL — ABNORMAL LOW (ref 4.22–5.81)
RDW: 12.3 % (ref 11.5–15.5)
WBC: 6.3 10*3/uL (ref 4.0–10.5)
nRBC: 0 % (ref 0.0–0.2)

## 2019-02-14 LAB — COMPREHENSIVE METABOLIC PANEL
ALT: 19 U/L (ref 0–44)
AST: 18 U/L (ref 15–41)
Albumin: 3.6 g/dL (ref 3.5–5.0)
Alkaline Phosphatase: 64 U/L (ref 38–126)
Anion gap: 11 (ref 5–15)
BUN: 11 mg/dL (ref 6–20)
CO2: 22 mmol/L (ref 22–32)
Calcium: 8.8 mg/dL — ABNORMAL LOW (ref 8.9–10.3)
Chloride: 104 mmol/L (ref 98–111)
Creatinine, Ser: 0.83 mg/dL (ref 0.61–1.24)
GFR calc Af Amer: >60 mL/min (ref >60)
GFR calc non Af Amer: >60 mL/min (ref >60)
Glucose, Bld: 165 mg/dL — ABNORMAL HIGH (ref 70–99)
Potassium: 2.6 mmol/L — CL (ref 3.5–5.1)
Sodium: 137 mmol/L (ref 135–145)
Total Bilirubin: 0.5 mg/dL (ref 0.3–1.2)
Total Protein: 6.8 g/dL (ref 6.5–8.1)

## 2019-02-14 LAB — MAGNESIUM: Magnesium: 1.5 mg/dL — ABNORMAL LOW (ref 1.7–2.4)

## 2019-02-14 MED ORDER — SODIUM CHLORIDE 0.9 % IV SOLN
Freq: Once | INTRAVENOUS | Status: AC
  Administered 2019-02-14: 09:00:00 via INTRAVENOUS
  Filled 2019-02-14: qty 20

## 2019-02-14 MED ORDER — SODIUM CHLORIDE 0.9 % IV SOLN
45.0000 mg/m2 | Freq: Once | INTRAVENOUS | Status: AC
  Administered 2019-02-14: 84 mg via INTRAVENOUS
  Filled 2019-02-14: qty 14

## 2019-02-14 MED ORDER — PALONOSETRON HCL INJECTION 0.25 MG/5ML
0.2500 mg | Freq: Once | INTRAVENOUS | Status: AC
  Administered 2019-02-14: 0.25 mg via INTRAVENOUS
  Filled 2019-02-14: qty 5

## 2019-02-14 MED ORDER — SODIUM CHLORIDE 0.9 % IV SOLN
20.0000 mg | Freq: Once | INTRAVENOUS | Status: AC
  Administered 2019-02-14: 20 mg via INTRAVENOUS
  Filled 2019-02-14: qty 2

## 2019-02-14 MED ORDER — DIPHENHYDRAMINE HCL 50 MG/ML IJ SOLN
50.0000 mg | Freq: Once | INTRAMUSCULAR | Status: AC
  Administered 2019-02-14: 50 mg via INTRAVENOUS
  Filled 2019-02-14: qty 1

## 2019-02-14 MED ORDER — SODIUM CHLORIDE 0.9% FLUSH
10.0000 mL | Freq: Once | INTRAVENOUS | Status: AC
  Administered 2019-02-14: 08:00:00 10 mL via INTRAVENOUS
  Filled 2019-02-14: qty 10

## 2019-02-14 MED ORDER — SODIUM CHLORIDE 0.9 % IV SOLN
230.0000 mg | Freq: Once | INTRAVENOUS | Status: AC
  Administered 2019-02-14: 12:00:00 230 mg via INTRAVENOUS
  Filled 2019-02-14: qty 23

## 2019-02-14 MED ORDER — SODIUM CHLORIDE 0.9 % IV SOLN
Freq: Once | INTRAVENOUS | Status: DC
  Filled 2019-02-14: qty 250

## 2019-02-14 MED ORDER — SODIUM CHLORIDE 0.9 % IV SOLN
Freq: Once | INTRAVENOUS | Status: AC
  Administered 2019-02-14: 09:00:00 via INTRAVENOUS
  Filled 2019-02-14: qty 250

## 2019-02-14 MED ORDER — POTASSIUM CHLORIDE CRYS ER 20 MEQ PO TBCR: 40.0000 meq | EXTENDED_RELEASE_TABLET | Freq: Every day | ORAL | 0 refills | Status: DC

## 2019-02-14 MED ORDER — FAMOTIDINE IN NACL 20-0.9 MG/50ML-% IV SOLN
20.0000 mg | Freq: Once | INTRAVENOUS | Status: AC
  Administered 2019-02-14: 20 mg via INTRAVENOUS
  Filled 2019-02-14: qty 50

## 2019-02-14 MED ORDER — HEPARIN SOD (PORK) LOCK FLUSH 100 UNIT/ML IV SOLN
500.0000 [IU] | Freq: Once | INTRAVENOUS | Status: AC | PRN
  Administered 2019-02-14: 500 [IU]
  Filled 2019-02-14 (×2): qty 5

## 2019-02-14 NOTE — Addendum Note (Signed)
Addended by: Beverly Gust on: 02/14/2019 08:39 AM   Modules accepted: Orders

## 2019-02-14 NOTE — Progress Notes (Signed)
Per MD, labs and VS reviewed and okay to proceed with treatment at this time, pt to receive IV Magnesium and Potassium (see MAR).

## 2019-02-14 NOTE — Progress Notes (Signed)
Hematology/Oncology Follow up note [facility] [phone] [phone]   Patient Care Team: Tracie Harrier, MD as PCP - General (Internal Medicine) [name], RN as Registered Nurse  REASON FOR VISIT:  Follow up for treatment of squamous lung cancer.   HISTORY OF PRESENTING ILLNESS:  # Dec 2019 Stage I squamous lung cancer #s/p Bronchoscopy on 06/25/2018. subcarina EBUS FNA was non diagnostic, hypocellular specimen.  # 08/13/2018 CT guided right upper lobe biopsy pathology showed dense fibrosis and mixed inflammatory cells with prominent polytypic plasma cells component. Focal benign bronchial wall and alveolar spaces. No malignancy was identified.   # 08/13/2018 underwent right thoracotomy and wedge resection of a right upper lobe mass.  Frozen section was consistent with an inflammatory process.  On the second postop day, preliminary pathology reports high-grade malignancy.  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  08/20/2018 Patient was therefore taken back to the OR and underwent take complete lobectomy  pT1b pN0 cM0 stage I squamous cell lung cancer.  Margin is negative.  Recommend observation.    # 12/03/2018 CT chest w contrast showed local recurrence.  12/10/2018 PET showed No evidence of distant metastatic disease # 12/26/2018 s/p bronchoscopy biopsy. Confirmed local recurrence of squamous cell lung cancer.   medi port placed by Dr.Oaks.  Cancer treatment Started concurrent chemoradiation on 01/16/2019 Carboplatin AUC of 2 and Taxol 45 mg per metered square weekly  INTERVAL HISTORY Samuel Choi is a 53 y.o. male who has above history reviewed by me today presents for evaluation prior to chemotherapy for treatment of  recurrent  squamous cell lung cancer. Status post 4 cycles carboplatin and Taxol concurrently with radiation Diarrhea, semi loose stools, average 3 times a day since 5 days ago. He has not tried anything.    Takes potassium 71mq daily, prescribed by PCP.  Has some cough, non productive.  .  Denies any dizziness or lightheadness.  Denies any fever, chills, nausea, vomiting, diarrhea, abdominal pain, denies any neuropathy.   Review of Systems  Constitutional: Negative for appetite change, chills, fatigue, fever and unexpected weight change.  HENT:   Negative for hearing loss and voice change.   Eyes: Negative for eye problems and icterus.  Respiratory: Positive for cough. Negative for chest tightness and shortness of breath.   Cardiovascular: Negative for chest pain and leg swelling.  Gastrointestinal: Positive for diarrhea. Negative for abdominal distention and abdominal pain.  Endocrine: Negative for hot flashes.  Genitourinary: Negative for difficulty urinating, dysuria and frequency.   Musculoskeletal: Negative for arthralgias.  Skin: Negative for itching and rash.  Neurological: Negative for light-headedness and numbness.  Hematological: Negative for adenopathy. Does not bruise/bleed easily.  Psychiatric/Behavioral: Negative for confusion.    MEDICAL HISTORY:  Past Medical History:  Diagnosis Date   Alcohol abuse    usually drinks 2-3 drinks per day   Atherosclerosis 06/2018   Chronic sinusitis    Dehydration 02/07/2019   [place]   patient unaware of this.   [place]   no surgery   History of kidney stones 05/2018   per xray, bilateral nephrolitiasis   Hypertension    [place]amous [place]    SURGICAL HISTORY: Past Surgical History:  Procedure Laterality Date   BRAIN SURGERY  10/2017   nasal/sinus endoscopy. mass benign   ELECTROMAGNETIC NAVIGATION BROCHOSCOPY Right 06/25/2018   Procedure: ELECTROMAGNETIC NAVIGATION BRONCHOSCOPY;  Surgeon: KFlora Lipps MD;  Location: [facility] ORS;  Service: Cardiopulmonary;  Laterality: Right;   ENDOBRONCHIAL ULTRASOUND Right 06/25/2018   Procedure:  ENDOBRONCHIAL ULTRASOUND;  Surgeon: [name], MD;  Location: [facility];  Service: Cardiopulmonary;  Laterality: Right;   ENDOBRONCHIAL ULTRASOUND Right 12/26/2018   Procedure: ENDOBRONCHIAL ULTRASOUND RIGHT;  Surgeon: [name], MD;  Location: [facility];  Service: Cardiopulmonary;  Laterality: Right;   FLEXIBLE BRONCHOSCOPY N/A 08/20/2018   Procedure: FLEXIBLE BRONCHOSCOPY PREOP;  Surgeon: [name], MD;  Location: [facility] OR[facility];  Service: Thoracic;  Laterality: N/A;   NASAL SINUS SURGERY  10/2017   At [facility], frontal sinusotomy, ethmoidectomy, resection anterior cranial fossa neoplasm, turbinate resection   PORTACATH PLACEMENT Left 01/15/2019   Procedure: INSERTION PORT-A-CATH;  Surgeon: [name], MD;  Location: [facility] OR[facility];  Service: General;  Laterality: Left;   THORACOTOMY Right 08/13/2018   Procedure: PREOP BROCHOSCOPY WITH RIGHT THORACOTOMY AND RUL RESECTION;  Surgeon: [name], MD;  Location: [facility];  Service: General;  Laterality: Right;   THORACOTOMY Right 08/20/2018   Procedure: THORACOTOMY MAJOR RIGHT UPPER LOBE LOBECTOMY;  Surgeon: [name], MD;  Location: [facility];  Service: Thoracic;  Laterality: Right;   TOE SURGERY Left    pin in left toe    SOCIAL HISTORY: Social History   Socioeconomic History   Marital status: Married    Spouse name: lisa   Number of children: Not on file   Years of education: Not on file   Highest education level: Not on file  Occupational History   Occupation: welding    Comment: taking time off to resolve issues  Social Designer, fashion/clothing strain: Not on file   Food insecurity    Worry: Not on file    Inability: Not on file   Transportation needs    Medical: Not on file    Non-medical: Not on file  Tobacco Use   Smoking status: Former Smoker    Packs/day: 0.50    Years: 15.00    Pack years: 7.50    Types: Cigarettes    Quit date: 08/2018    Years since quitting: 0.5   Smokeless tobacco: Never Used    Substance and Sexual Activity   Alcohol use: Yes    Alcohol/week: 3.0 standard drinks    Types: 3 Cans of beer per week    Comment: usually 2 drinks per week, per patient   Drug use: No   Sexual activity: Not on file  Lifestyle   Physical activity    Days per week: Not on file    Minutes per session: Not on file   Stress: Not on file  Relationships   Social connections    Talks on phone: Not on file    Gets together: Not on file    Attends religious service: Not on file    Active member of club or organization: Not on file    Attends meetings of clubs or organizations: Not on file    Relationship status: Not on file   Intimate partner violence    Fear of current or ex partner: Not on file    Emotionally abused: Not on file    Physically abused: Not on file    Forced sexual activity: Not on file  Other Topics Concern   Not on file  Social History Narrative   Not on file    FAMILY HISTORY: Family History  Problem Relation Age of Onset   Breast cancer Mother    Diabetes Mother    Lung cancer Father  Hypertension Father     ALLERGIES:  has No Known Allergies.  MEDICATIONS:  Current Outpatient Medications  Medication Sig Dispense Refill   amLODipine (NORVASC) 10 MG tablet Take 10 mg by mouth daily.      cloNIDine (CATAPRES) 0.1 MG tablet Take 0.1 mg by mouth daily.   5   folic acid (V-R FOLIC ACID) 416 MCG tablet Take 1 tablet (400 mcg total) by mouth daily. 90 tablet 1   hydrALAZINE (APRESOLINE) 100 MG tablet Take 50 mg by mouth 2 (two) times daily.      hydrochlorothiazide (HYDRODIURIL) 12.5 MG tablet Take 12.5 mg by mouth daily.      lidocaine-prilocaine (EMLA) cream Apply to affected area once 30 g 3   methocarbamol (ROBAXIN) 500 MG tablet Take 1 tablet (500 mg total) by mouth every 6 (six) hours. 30 tablet 0   olmesartan (BENICAR) 40 MG tablet Take 40 mg by mouth every other day.      ondansetron (ZOFRAN) 8 MG tablet Take 1 tablet (8 mg  total) by mouth 2 (two) times daily as needed for refractory nausea / vomiting. Start on day 3 after chemo. 30 tablet 1   oxyCODONE-acetaminophen (PERCOCET) 5-325 MG tablet Take 1-2 tablets by mouth every 4 (four) hours as needed for severe pain. 20 tablet 0   potassium chloride SA (K-DUR) 20 MEQ tablet Take 2 tablets (40 mEq total) by mouth daily. 30 tablet 0   prochlorperazine (COMPAZINE) 10 MG tablet Take 1 tablet (10 mg total) by mouth every 6 (six) hours as needed (Nausea or vomiting). 30 tablet 1   No current facility-administered medications for this visit.    Facility-Administered Medications Ordered in Other Visits  Medication Dose Route Frequency Provider Last Rate Last Dose   albuterol (PROVENTIL) (2.5 MG/3ML) 0.083% nebulizer solution 2.5 mg  2.5 mg Nebulization Once System, Provider Not In         PHYSICAL EXAMINATION: ECOG PERFORMANCE STATUS: 0 - Asymptomatic Vitals:   02/14/19 0832  BP: (!) 153/90  Pulse: 99  Resp: 16  Temp: (!) 95.8 F (35.4 C)   Filed Weights   02/14/19 0832  Weight: 140 lb 1.6 oz (63.5 kg)    Physical Exam Constitutional:      General: He is not in acute distress.    Comments: Thin  HENT:     Head: Normocephalic and atraumatic.  Eyes:     General: No scleral icterus.    Pupils: Pupils are equal, round, and reactive to light.  Neck:     Musculoskeletal: Normal range of motion and neck supple.  Cardiovascular:     Rate and Rhythm: Normal rate and regular rhythm.     Heart sounds: Normal heart sounds.  Pulmonary:     Effort: Pulmonary effort is normal. No respiratory distress.     Breath sounds: Wheezing present.  Abdominal:     General: Bowel sounds are normal. There is no distension.     Palpations: Abdomen is soft. There is no mass.     Tenderness: There is no abdominal tenderness.  Musculoskeletal: Normal range of motion.        General: No deformity.  Skin:    General: Skin is warm and dry.     Findings: No erythema or  rash.  Neurological:     Mental Status: He is alert and oriented to person, place, and time.     Cranial Nerves: No cranial nerve deficit.     Coordination: Coordination normal.  Psychiatric:  Behavior: Behavior normal.        Thought Content: Thought content normal.      LABORATORY DATA:  I have reviewed the data as listed Lab Results  Component Value Date   WBC 6.3 02/14/2019   HGB 11.5 (L) 02/14/2019   HCT 32.1 (L) 02/14/2019   MCV 101.6 (H) 02/14/2019   PLT 119 (L) 02/14/2019   Recent Labs    01/31/19 0757 02/07/19 0816 02/14/19 0803  NA 135 137 137  K 3.6 3.5 2.6*  CL 101 102 104  CO2 21* 23 22  GLUCOSE 128* 111* 165*  BUN _0 CREATININE 0.92 0.88 0.83  CALCIUM 8.9 8.8* 8.8*  GFRNONAA >60 >60 >60  GFRAA >60 >60 >60  PROT 7.2 6.5 6.8  ALBUMIN 3.7 3.4* 3.6  AST 37 27 18  ALT _1 ALKPHOS 80 71 64  BILITOT 0.6 0.5 0.5   Iron/TIBC/Ferritin/ %Sat No results found for: IRON, TIBC, FERRITIN, IRONPCTSAT   Pathology 12/26/2018 Right hilar adenopathy/mass-EUS guided FNA Positive for malignancy, non small cell carcinoma, morphologically similar to previous right upper lobe squamous cell carcinoma.   ASSESSMENT & PLAN:  1. Squamous cell carcinoma of lung, right ([place])   2. Hypokalemia   3. Encounter for antineoplastic chemotherapy   4. Dehydration   5. Folate deficiency   6. Hypomagnesemia    # Recurrent Squamous cell carcinoma of lung,  Tolerates4 cycles of weekly Carbo and Taxol. Counts are acceptable to proceed with cycle 5 Carbo and Taxol.   # Hypokalemia and hypomagnesia,   Recommend 20mq KCL, 2 g of mag sulfate in 500cc normal saline over 2 hours  # Folate deficiency, continue folate supplementation.  # Diarrhea, recommend Imodium PRN. Instruction details were discussed.   Return of visit: 1 weeks.  ZEarlie Server MD, PhD Hematology Oncology [facility]02/14/2019

## 2019-02-14 NOTE — Progress Notes (Signed)
Patient does not offer any problems today. He has been taking the Benicar 40mg  QOD as advised at last MD with home readings around 135/80.  BP elevated today 153/90, 144/96, 147/92.

## 2019-02-17 ENCOUNTER — Ambulatory Visit
Admission: RE | Admit: 2019-02-17 | Discharge: 2019-02-17 | Disposition: A | Discharge status: Home / Self Care | Payer: BC Managed Care – PPO | Source: Ambulatory Visit | Attending: Radiation Oncology | Admitting: Radiation Oncology

## 2019-02-17 ENCOUNTER — Telehealth: Payer: Self-pay | Admitting: *Deleted

## 2019-02-17 ENCOUNTER — Other Ambulatory Visit: Payer: Self-pay

## 2019-02-17 DIAGNOSIS — C3411 Malignant neoplasm of upper lobe, right bronchus or lung: Secondary | ICD-10-CM | POA: Diagnosis not present

## 2019-02-17 NOTE — Telephone Encounter (Signed)
Thanks for the update

## 2019-02-17 NOTE — Telephone Encounter (Addendum)
Wife called with questions. She was asking how he is doing. I read the office note and told her he needs to drink more fluids and that his potassium is low and he needs to take potassium tabs. Also he needs to take Imodium for his diarrhea. She states he is taking potassium and she got imodium Friday. She also reports that patient is drinking Gin on the weekend and that he quit smoking cigarettes, but he is smoking cigars. She requests that we not let him know she told us this as it would cause problems between them, but she feels we need to know this

## 2019-02-18 ENCOUNTER — Other Ambulatory Visit: Payer: Self-pay

## 2019-02-18 ENCOUNTER — Ambulatory Visit
Admission: RE | Admit: 2019-02-18 | Discharge: 2019-02-18 | Disposition: A | Discharge status: Home / Self Care | Payer: BC Managed Care – PPO | Source: Ambulatory Visit | Attending: Radiation Oncology | Admitting: Radiation Oncology

## 2019-02-18 DIAGNOSIS — C3411 Malignant neoplasm of upper lobe, right bronchus or lung: Secondary | ICD-10-CM | POA: Diagnosis not present

## 2019-02-19 ENCOUNTER — Other Ambulatory Visit: Payer: Self-pay

## 2019-02-19 ENCOUNTER — Ambulatory Visit
Admission: RE | Admit: 2019-02-19 | Discharge: 2019-02-19 | Disposition: A | Discharge status: Home / Self Care | Payer: BC Managed Care – PPO | Source: Ambulatory Visit | Attending: Radiation Oncology | Admitting: Radiation Oncology

## 2019-02-19 DIAGNOSIS — C3411 Malignant neoplasm of upper lobe, right bronchus or lung: Secondary | ICD-10-CM | POA: Diagnosis not present

## 2019-02-20 ENCOUNTER — Other Ambulatory Visit: Payer: Self-pay

## 2019-02-20 ENCOUNTER — Ambulatory Visit
Admission: RE | Admit: 2019-02-20 | Discharge: 2019-02-20 | Disposition: A | Discharge status: Home / Self Care | Payer: BC Managed Care – PPO | Source: Ambulatory Visit | Attending: Radiation Oncology | Admitting: Radiation Oncology

## 2019-02-20 DIAGNOSIS — C3411 Malignant neoplasm of upper lobe, right bronchus or lung: Secondary | ICD-10-CM | POA: Diagnosis not present

## 2019-02-21 ENCOUNTER — Ambulatory Visit
Admission: RE | Admit: 2019-02-21 | Discharge: 2019-02-21 | Disposition: A | Discharge status: Home / Self Care | Payer: BC Managed Care – PPO | Source: Ambulatory Visit | Attending: Radiation Oncology | Admitting: Radiation Oncology

## 2019-02-21 ENCOUNTER — Inpatient Hospital Stay: Payer: BC Managed Care – PPO

## 2019-02-21 ENCOUNTER — Other Ambulatory Visit: Payer: Self-pay

## 2019-02-21 ENCOUNTER — Other Ambulatory Visit: Payer: Self-pay | Admitting: Oncology

## 2019-02-21 ENCOUNTER — Inpatient Hospital Stay (HOSPITAL_BASED_OUTPATIENT_CLINIC_OR_DEPARTMENT_OTHER): Payer: BC Managed Care – PPO | Admitting: Oncology

## 2019-02-21 ENCOUNTER — Encounter: Payer: Self-pay | Admitting: Oncology

## 2019-02-21 VITALS — BP 112/72 | HR 101 | Temp 96.2°F | Resp 16 | Wt 142.1 lb

## 2019-02-21 DIAGNOSIS — C3491 Malignant neoplasm of unspecified part of right bronchus or lung: Secondary | ICD-10-CM

## 2019-02-21 DIAGNOSIS — Z5111 Encounter for antineoplastic chemotherapy: Secondary | ICD-10-CM

## 2019-02-21 DIAGNOSIS — E876 Hypokalemia: Secondary | ICD-10-CM

## 2019-02-21 DIAGNOSIS — E860 Dehydration: Secondary | ICD-10-CM

## 2019-02-21 DIAGNOSIS — C3411 Malignant neoplasm of upper lobe, right bronchus or lung: Secondary | ICD-10-CM | POA: Diagnosis not present

## 2019-02-21 DIAGNOSIS — Z95828 Presence of other vascular implants and grafts: Secondary | ICD-10-CM

## 2019-02-21 DIAGNOSIS — R062 Wheezing: Secondary | ICD-10-CM

## 2019-02-21 LAB — COMPREHENSIVE METABOLIC PANEL
ALT: 20 U/L (ref 0–44)
AST: 25 U/L (ref 15–41)
Albumin: 3.6 g/dL (ref 3.5–5.0)
Alkaline Phosphatase: 67 U/L (ref 38–126)
Anion gap: 10 (ref 5–15)
BUN: 11 mg/dL (ref 6–20)
CO2: 24 mmol/L (ref 22–32)
Calcium: 8.7 mg/dL — ABNORMAL LOW (ref 8.9–10.3)
Chloride: 104 mmol/L (ref 98–111)
Creatinine, Ser: 0.71 mg/dL (ref 0.61–1.24)
GFR calc Af Amer: >60 mL/min (ref >60)
GFR calc non Af Amer: >60 mL/min (ref >60)
Glucose, Bld: 96 mg/dL (ref 70–99)
Potassium: 3.4 mmol/L — ABNORMAL LOW (ref 3.5–5.1)
Sodium: 138 mmol/L (ref 135–145)
Total Bilirubin: 0.4 mg/dL (ref 0.3–1.2)
Total Protein: 6.8 g/dL (ref 6.5–8.1)

## 2019-02-21 LAB — CBC WITH DIFFERENTIAL/PLATELET
Abs Immature Granulocytes: 0.01 10*3/uL (ref 0.00–0.07)
Basophils Absolute: 0 10*3/uL (ref 0.0–0.1)
Basophils Relative: 1 %
Eosinophils Absolute: 0.1 10*3/uL (ref 0.0–0.5)
Eosinophils Relative: 3 %
HCT: 30.2 % — ABNORMAL LOW (ref 39.0–52.0)
Hemoglobin: 10.8 g/dL — ABNORMAL LOW (ref 13.0–17.0)
Immature Granulocytes: 0 %
Lymphocytes Relative: 23 %
Lymphs Abs: 0.9 10*3/uL (ref 0.7–4.0)
MCH: 36.7 pg — ABNORMAL HIGH (ref 26.0–34.0)
MCHC: 35.8 g/dL (ref 30.0–36.0)
MCV: 102.7 fL — ABNORMAL HIGH (ref 80.0–100.0)
Monocytes Absolute: 0.2 10*3/uL (ref 0.1–1.0)
Monocytes Relative: 5 %
Neutro Abs: 2.7 10*3/uL (ref 1.7–7.7)
Neutrophils Relative %: 68 %
Platelets: 127 10*3/uL — ABNORMAL LOW (ref 150–400)
RBC: 2.94 MIL/uL — ABNORMAL LOW (ref 4.22–5.81)
RDW: 12.4 % (ref 11.5–15.5)
Smear Review: NORMAL
WBC Morphology: ABNORMAL
WBC: 3.9 10*3/uL — ABNORMAL LOW (ref 4.0–10.5)
nRBC: 0 % (ref 0.0–0.2)

## 2019-02-21 LAB — MAGNESIUM: Magnesium: 1.5 mg/dL — ABNORMAL LOW (ref 1.7–2.4)

## 2019-02-21 MED ORDER — MAGNESIUM CHLORIDE 64 MG PO TBEC: 1.0000 | DELAYED_RELEASE_TABLET | Freq: Every day | ORAL | 0 refills | Status: DC

## 2019-02-21 MED ORDER — HEPARIN SOD (PORK) LOCK FLUSH 100 UNIT/ML IV SOLN
500.0000 [IU] | Freq: Once | INTRAVENOUS | Status: AC | PRN
  Administered 2019-02-21: 500 [IU]
  Filled 2019-02-21: qty 5

## 2019-02-21 MED ORDER — METHOCARBAMOL 500 MG PO TABS: 500.0000 mg | ORAL_TABLET | Freq: Every day | ORAL | 0 refills | Status: DC

## 2019-02-21 MED ORDER — SODIUM CHLORIDE 0.9 % IV SOLN
Freq: Once | INTRAVENOUS | Status: AC
  Administered 2019-02-21: 09:00:00 via INTRAVENOUS
  Filled 2019-02-21: qty 250

## 2019-02-21 MED ORDER — PALONOSETRON HCL INJECTION 0.25 MG/5ML
0.2500 mg | Freq: Once | INTRAVENOUS | Status: AC
  Administered 2019-02-21: 0.25 mg via INTRAVENOUS
  Filled 2019-02-21: qty 5

## 2019-02-21 MED ORDER — SODIUM CHLORIDE 0.9 % IV SOLN
239.2000 mg | Freq: Once | INTRAVENOUS | Status: AC
  Administered 2019-02-21: 240 mg via INTRAVENOUS
  Filled 2019-02-21: qty 24

## 2019-02-21 MED ORDER — FAMOTIDINE IN NACL 20-0.9 MG/50ML-% IV SOLN
20.0000 mg | Freq: Once | INTRAVENOUS | Status: AC
  Administered 2019-02-21: 20 mg via INTRAVENOUS
  Filled 2019-02-21: qty 50

## 2019-02-21 MED ORDER — SODIUM CHLORIDE 0.9 % IV SOLN
45.0000 mg/m2 | Freq: Once | INTRAVENOUS | Status: AC
  Administered 2019-02-21: 84 mg via INTRAVENOUS
  Filled 2019-02-21: qty 14

## 2019-02-21 MED ORDER — SODIUM CHLORIDE 0.9% FLUSH
10.0000 mL | Freq: Once | INTRAVENOUS | Status: AC
  Administered 2019-02-21: 10 mL via INTRAVENOUS
  Filled 2019-02-21: qty 10

## 2019-02-21 MED ORDER — MAGNESIUM SULFATE 2 GM/50ML IV SOLN
2.0000 g | Freq: Once | INTRAVENOUS | Status: AC
  Administered 2019-02-21: 10:00:00 2 g via INTRAVENOUS
  Filled 2019-02-21: qty 50

## 2019-02-21 MED ORDER — SODIUM CHLORIDE 0.9 % IV SOLN
20.0000 mg | Freq: Once | INTRAVENOUS | Status: AC
  Administered 2019-02-21: 20 mg via INTRAVENOUS
  Filled 2019-02-21: qty 2

## 2019-02-21 MED ORDER — ALBUTEROL SULFATE HFA 108 (90 BASE) MCG/ACT IN AERS: 2.0000 | INHALATION_SPRAY | Freq: Four times a day (QID) | RESPIRATORY_TRACT | 3 refills | Status: DC | PRN

## 2019-02-21 MED ORDER — SODIUM CHLORIDE 0.9 % IV SOLN: 2.0000 g | Freq: Once | INTRAVENOUS | Status: DC

## 2019-02-21 MED ORDER — DIPHENHYDRAMINE HCL 50 MG/ML IJ SOLN
50.0000 mg | Freq: Once | INTRAMUSCULAR | Status: AC
  Administered 2019-02-21: 50 mg via INTRAVENOUS
  Filled 2019-02-21: qty 1

## 2019-02-21 NOTE — Progress Notes (Signed)
Patient does not offer any problems today.  

## 2019-02-21 NOTE — Progress Notes (Signed)
MD reviewed labs and VS (HR 101). Per Dr. Tasia Catchings okay to proceed with treatment and pt to receive 2g of IV magnesium.

## 2019-02-21 NOTE — Progress Notes (Signed)
HR 101, ok to proceed per MD

## 2019-02-21 NOTE — Progress Notes (Signed)
Hematology/Oncology Follow up note [facility] [phone] [phone]   Patient Care Team: Tracie Harrier, MD as PCP - General (Internal Medicine) [name], RN as Registered Nurse  REASON FOR VISIT:  Follow up for treatment of squamous lung cancer.   HISTORY OF PRESENTING ILLNESS:  # Dec 2019 Stage I squamous lung cancer #s/p Bronchoscopy on 06/25/2018. subcarina EBUS FNA was non diagnostic, hypocellular specimen.  # 08/13/2018 CT guided right upper lobe biopsy pathology showed dense fibrosis and mixed inflammatory cells with prominent polytypic plasma cells component. Focal benign bronchial wall and alveolar spaces. No malignancy was identified.   # 08/13/2018 underwent right thoracotomy and wedge resection of a right upper lobe mass.  Frozen section was consistent with an inflammatory process.  On the second postop day, preliminary pathology reports high-grade malignancy.  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  08/20/2018 Patient was therefore taken back to the OR and underwent take complete lobectomy  pT1b pN0 cM0 stage I squamous cell lung cancer.  Margin is negative.  Recommend observation.    # 12/03/2018 CT chest w contrast showed local recurrence.  12/10/2018 PET showed No evidence of distant metastatic disease # 12/26/2018 s/p bronchoscopy biopsy. Confirmed local recurrence of squamous cell lung cancer.   medi port placed by Dr.Oaks.  Cancer treatment Started concurrent chemoradiation on 01/16/2019 Carboplatin AUC of 2 and Taxol 45 mg per metered square weekly  INTERVAL HISTORY Samuel Choi is a 53 y.o. male who has above history reviewed by me today presents for evaluation prior to chemotherapy for treatment of  recurrent  squamous cell lung cancer. Status post 4 cycles carboplatin and Taxol concurrently with radiation Patient reports feeling well at baseline. He continues to have intermittent semi-loose stools,  average 2-3 times a day. Uses Imodium. He takes potassium 40 mEq daily. Requests Robaxin to be refilled.  Originally prescribed by Dr. Genevive Bi. He takes 500 mg every night before going to bed and this has helped his muscle spasms..  Denies any dizziness or lightheadness.  Denies any fever, chills, nausea, vomiting, diarrhea, abdominal pain, denies any neuropathy.   Review of Systems  Constitutional: Negative for appetite change, chills, fatigue, fever and unexpected weight change.  HENT:   Negative for hearing loss and voice change.   Eyes: Negative for eye problems and icterus.  Respiratory: Negative for chest tightness, cough and shortness of breath.   Cardiovascular: Negative for chest pain and leg swelling.  Gastrointestinal: Positive for diarrhea. Negative for abdominal distention and abdominal pain.  Endocrine: Negative for hot flashes.  Genitourinary: Negative for difficulty urinating, dysuria and frequency.   Musculoskeletal: Negative for arthralgias.       Back spasms  Skin: Negative for itching and rash.  Neurological: Negative for light-headedness and numbness.  Hematological: Negative for adenopathy. Does not bruise/bleed easily.  Psychiatric/Behavioral: Negative for confusion.    MEDICAL HISTORY:  Past Medical History:  Diagnosis Date  . Alcohol abuse    usually drinks 2-3 drinks per day  . Atherosclerosis 06/2018  . Chronic sinusitis   . Dehydration 02/07/2019  . Emphysema of lung ([place]) 06/2018   patient unaware of this.  . Hip fracture ([place]) 05/2018   no surgery  . History of kidney stones 05/2018   per xray, bilateral nephrolitiasis  . Hypertension   . Squamous cell carcinoma of lung, right ([place]) 06/2018    SURGICAL HISTORY: Past Surgical History:  Procedure Laterality Date  . BRAIN SURGERY  10/2017   nasal/sinus  endoscopy. mass benign  . ELECTROMAGNETIC NAVIGATION BROCHOSCOPY Right 06/25/2018   Procedure: ELECTROMAGNETIC NAVIGATION BRONCHOSCOPY;   Surgeon: [name], MD;  Location: [facility] OR[facility];  Service: Cardiopulmonary;  Laterality: Right;  . ENDOBRONCHIAL ULTRASOUND Right 06/25/2018   Procedure: ENDOBRONCHIAL ULTRASOUND;  Surgeon: [name], MD;  Location: [facility];  Service: Cardiopulmonary;  Laterality: Right;  . ENDOBRONCHIAL ULTRASOUND Right 12/26/2018   Procedure: ENDOBRONCHIAL ULTRASOUND RIGHT;  Surgeon: [name], MD;  Location: [facility];  Service: Cardiopulmonary;  Laterality: Right;  . FLEXIBLE BRONCHOSCOPY N/A 08/20/2018   Procedure: FLEXIBLE BRONCHOSCOPY PREOP;  Surgeon: [name], MD;  Location: [facility] OR[facility];  Service: Thoracic;  Laterality: N/A;  . NASAL SINUS SURGERY  10/2017   At [facility], frontal sinusotomy, ethmoidectomy, resection anterior cranial fossa neoplasm, turbinate resection  . PORTACATH PLACEMENT Left 01/15/2019   Procedure: INSERTION PORT-A-CATH;  Surgeon: [name], MD;  Location: [facility] OR[facility];  Service: General;  Laterality: Left;  . THORACOTOMY Right 08/13/2018   Procedure: PREOP BROCHOSCOPY WITH RIGHT THORACOTOMY AND RUL RESECTION;  Surgeon: [name], MD;  Location: [facility];  Service: General;  Laterality: Right;  . THORACOTOMY Right 08/20/2018   Procedure: THORACOTOMY MAJOR RIGHT UPPER LOBE LOBECTOMY;  Surgeon: [name], MD;  Location: [facility];  Service: Thoracic;  Laterality: Right;  . TOE SURGERY Left    pin in left toe    SOCIAL HISTORY: Social History   Socioeconomic History  . Marital status: Married    Spouse name: lisa  . Number of children: Not on file  . Years of education: Not on file  . Highest education level: Not on file  Occupational History  . 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 Financial resource strain: Not on file  . Food insecurity    Worry: Not on file    Inability: Not on file  . Transportation needs    Medical: Not on file    Non-medical: Not on file  Tobacco Use  . Smoking status: Former Smoker    Packs/day:  0.50    Years: 15.00    Pack years: 7.50    Types: Cigarettes    Quit date: 08/2018    Years since quitting: 0.5  . Smokeless tobacco: Never Used  Substance and Sexual Activity  . Alcohol use: Yes    Alcohol/week: 3.0 standard drinks    Types: 3 Cans of beer per week    Comment: usually 2 drinks per week, per patient  . Drug use: No  . Sexual activity: Not on file  Lifestyle  . Physical activity    Days per week: Not on file    Minutes per session: Not on file  . Stress: Not on file  Relationships  . Social Herbalist on phone: Not on file    Gets together: Not on file    Attends religious service: Not on file    Active member of club or organization: Not on file    Attends meetings of clubs or organizations: Not on file    Relationship status: Not on file  . Intimate partner violence    Fear of current or ex partner: Not on file    Emotionally abused: Not on file    Physically abused: Not on file    Forced sexual activity: Not on file  Other Topics Concern  . Not on file  Social History Narrative  . Not on file    FAMILY HISTORY:  Family History  Problem Relation Age of Onset  . Breast cancer Mother   . Diabetes Mother   . Lung cancer Father   . Hypertension Father     ALLERGIES:  has No Known Allergies.  MEDICATIONS:  Current Outpatient Medications  Medication Sig Dispense Refill  . amLODipine (NORVASC) 10 MG tablet Take 10 mg by mouth daily.     . cloNIDine (CATAPRES) 0.1 MG tablet Take 0.1 mg by mouth daily.   5  . folic acid (V-R FOLIC ACID) 681 MCG tablet Take 1 tablet (400 mcg total) by mouth daily. 90 tablet 1  . hydrALAZINE (APRESOLINE) 100 MG tablet Take 50 mg by mouth 2 (two) times daily.     . hydrochlorothiazide (HYDRODIURIL) 12.5 MG tablet Take 12.5 mg by mouth daily.     Marland Kitchen lidocaine-prilocaine (EMLA) cream Apply to affected area once 30 g 3  . olmesartan (BENICAR) 40 MG tablet Take 40 mg by mouth every other day.     . ondansetron  (ZOFRAN) 8 MG tablet Take 1 tablet (8 mg total) by mouth 2 (two) times daily as needed for refractory nausea / vomiting. Start on day 3 after chemo. 30 tablet 1  . oxyCODONE-acetaminophen (PERCOCET) 5-325 MG tablet Take 1-2 tablets by mouth every 4 (four) hours as needed for severe pain. 20 tablet 0  . potassium chloride SA (K-DUR) 20 MEQ tablet Take 2 tablets (40 mEq total) by mouth daily. 30 tablet 0  . prochlorperazine (COMPAZINE) 10 MG tablet Take 1 tablet (10 mg total) by mouth every 6 (six) hours as needed (Nausea or vomiting). 30 tablet 1  . albuterol (VENTOLIN HFA) 108 (90 Base) MCG/ACT inhaler Inhale 2 puffs into the lungs every 6 (six) hours as needed for wheezing or shortness of breath. 8 g 3  . magnesium chloride (SLOW-MAG) 64 MG TBEC SR tablet Take 1 tablet (64 mg total) by mouth daily. 30 tablet 0  . methocarbamol (ROBAXIN) 500 MG tablet Take 1 tablet (500 mg total) by mouth at bedtime. 30 tablet 0   No current facility-administered medications for this visit.    Facility-Administered Medications Ordered in Other Visits  Medication Dose Route Frequency Provider Last Rate Last Dose  . albuterol (PROVENTIL) (2.5 MG/3ML) 0.083% nebulizer solution 2.5 mg  2.5 mg Nebulization Once System, Provider Not In      . CARBOplatin (PARAPLATIN) 240 mg in sodium chloride 0.9 % 250 mL chemo infusion  240 mg Intravenous Once Earlie Server, MD      . dexamethasone (DECADRON) 20 mg in sodium chloride 0.9 % 50 mL IVPB  20 mg Intravenous Once Earlie Server, MD      . diphenhydrAMINE (BENADRYL) injection 50 mg  50 mg Intravenous Once Earlie Server, MD      . famotidine (PEPCID) IVPB 20 mg premix  20 mg Intravenous Once Earlie Server, MD      . heparin lock flush 100 unit/mL  500 Units Intracatheter Once PRN Earlie Server, MD      . magnesium sulfate IVPB 2 g 50 mL  2 g Intravenous Once Earlie Server, MD      . PACLitaxel (TAXOL) 84 mg in sodium chloride 0.9 % 250 mL chemo infusion (</= 50m/m2)  45 mg/m2 (Treatment Plan Recorded)  Intravenous Once YEarlie Server MD      . palonosetron (ALOXI) injection 0.25 mg  0.25 mg Intravenous Once YEarlie Server MD         PHYSICAL EXAMINATION: ECOG PERFORMANCE STATUS: 0 - Asymptomatic  Vitals:   02/21/19 0844  BP: 112/72  Pulse: (!) 101  Resp: 16  Temp: (!) 96.2 F (35.7 C)  SpO2: 99%   Filed Weights   02/21/19 0844  Weight: 142 lb 1.6 oz (64.5 kg)    Physical Exam Constitutional:      General: He is not in acute distress.    Comments: Thin  HENT:     Head: Normocephalic and atraumatic.  Eyes:     General: No scleral icterus.    Pupils: Pupils are equal, round, and reactive to light.  Neck:     Musculoskeletal: Normal range of motion and neck supple.  Cardiovascular:     Rate and Rhythm: Normal rate and regular rhythm.     Heart sounds: Normal heart sounds.  Pulmonary:     Effort: Pulmonary effort is normal. No respiratory distress.     Breath sounds: Wheezing present.  Abdominal:     General: Bowel sounds are normal. There is no distension.     Palpations: Abdomen is soft. There is no mass.     Tenderness: There is no abdominal tenderness.  Musculoskeletal: Normal range of motion.        General: No deformity.  Skin:    General: Skin is warm and dry.     Findings: No erythema or rash.  Neurological:     Mental Status: He is alert and oriented to person, place, and time.     Cranial Nerves: No cranial nerve deficit.     Coordination: Coordination normal.  Psychiatric:        Behavior: Behavior normal.        Thought Content: Thought content normal.      LABORATORY DATA:  I have reviewed the data as listed Lab Results  Component Value Date   WBC 3.9 (L) 02/21/2019   HGB 10.8 (L) 02/21/2019   HCT 30.2 (L) 02/21/2019   MCV 102.7 (H) 02/21/2019   PLT 127 (L) 02/21/2019   Recent Labs    02/07/19 0816 02/14/19 0803 02/21/19 0810  NA 137 137 138  K 3.5 2.6* 3.4*  CL 102 104 104  CO2 _0 GLUCOSE 111* 165* 96  BUN _1 CREATININE 0.88  0.83 0.71  CALCIUM 8.8* 8.8* 8.7*  GFRNONAA >60 >60 >60  GFRAA >60 >60 >60  PROT 6.5 6.8 6.8  ALBUMIN 3.4* 3.6 3.6  AST _2 ALT _3 ALKPHOS 71 64 67  BILITOT 0.5 0.5 0.4   Iron/TIBC/Ferritin/ %Sat No results found for: IRON, TIBC, FERRITIN, IRONPCTSAT   Pathology 12/26/2018 Right hilar adenopathy/mass-EUS guided FNA Positive for malignancy, non small cell carcinoma, morphologically similar to previous right upper lobe squamous cell carcinoma.   ASSESSMENT & PLAN:  1. Squamous cell carcinoma of lung, right ([place])   2. Hypokalemia   3. Hypomagnesemia   4. Encounter for antineoplastic chemotherapy   5. Wheezing    # Recurrent Squamous cell carcinoma of lung,  Tolerates 5 cycles of weekly Carbo and Taxol. Labs are reviewed and discussed with patient.  Counts acceptable to proceed with weekly cycle 6 carbo and Taxol  # Hypokalemia and hypomagnesia,   Continue take potassium 40 mEq supplementation orally daily.  We will proceed with IV, 2 g of mag sulfate x1, start oral Slow Mag 1 tablet daily.  Prescription sent to pharmacy.  # Folate deficiency, continue folate supplementation.  # Diarrhea, continue Imodium PRN. Instruction details were discussed.  #Wheezing right  lower lobe, recommend albuterol inhaler every 6 hours as needed for wheezing.  Prescription sent to pharmacy peer  Return of visit: 1 weeks.  Earlie Server, MD, PhD Hematology Oncology [place] at [facility] 02/21/2019

## 2019-02-24 ENCOUNTER — Other Ambulatory Visit: Payer: Self-pay

## 2019-02-24 ENCOUNTER — Ambulatory Visit
Admission: RE | Admit: 2019-02-24 | Discharge: 2019-02-24 | Disposition: A | Discharge status: Home / Self Care | Payer: BC Managed Care – PPO | Source: Ambulatory Visit | Attending: Radiation Oncology | Admitting: Radiation Oncology

## 2019-02-24 DIAGNOSIS — C3411 Malignant neoplasm of upper lobe, right bronchus or lung: Secondary | ICD-10-CM | POA: Diagnosis not present

## 2019-02-25 ENCOUNTER — Ambulatory Visit
Admission: RE | Admit: 2019-02-25 | Discharge: 2019-02-25 | Disposition: A | Discharge status: Home / Self Care | Payer: BC Managed Care – PPO | Source: Ambulatory Visit | Attending: Radiation Oncology | Admitting: Radiation Oncology

## 2019-02-25 ENCOUNTER — Other Ambulatory Visit: Payer: Self-pay

## 2019-02-25 DIAGNOSIS — C3411 Malignant neoplasm of upper lobe, right bronchus or lung: Secondary | ICD-10-CM | POA: Diagnosis not present

## 2019-02-26 ENCOUNTER — Ambulatory Visit
Admission: RE | Admit: 2019-02-26 | Discharge: 2019-02-26 | Disposition: A | Discharge status: Home / Self Care | Payer: BC Managed Care – PPO | Source: Ambulatory Visit | Attending: Radiation Oncology | Admitting: Radiation Oncology

## 2019-02-26 ENCOUNTER — Other Ambulatory Visit: Payer: Self-pay

## 2019-02-26 DIAGNOSIS — C3411 Malignant neoplasm of upper lobe, right bronchus or lung: Secondary | ICD-10-CM | POA: Diagnosis not present

## 2019-02-27 ENCOUNTER — Other Ambulatory Visit: Payer: Self-pay

## 2019-02-27 ENCOUNTER — Ambulatory Visit
Admission: RE | Admit: 2019-02-27 | Discharge: 2019-02-27 | Disposition: A | Discharge status: Home / Self Care | Payer: BC Managed Care – PPO | Source: Ambulatory Visit | Attending: Radiation Oncology | Admitting: Radiation Oncology

## 2019-02-27 DIAGNOSIS — C3411 Malignant neoplasm of upper lobe, right bronchus or lung: Secondary | ICD-10-CM | POA: Diagnosis not present

## 2019-02-28 ENCOUNTER — Inpatient Hospital Stay (HOSPITAL_BASED_OUTPATIENT_CLINIC_OR_DEPARTMENT_OTHER): Payer: BC Managed Care – PPO | Admitting: Oncology

## 2019-02-28 ENCOUNTER — Ambulatory Visit
Admission: RE | Admit: 2019-02-28 | Discharge: 2019-02-28 | Disposition: A | Discharge status: Home / Self Care | Payer: BC Managed Care – PPO | Source: Ambulatory Visit | Attending: Radiation Oncology | Admitting: Radiation Oncology

## 2019-02-28 ENCOUNTER — Inpatient Hospital Stay: Payer: BC Managed Care – PPO

## 2019-02-28 ENCOUNTER — Other Ambulatory Visit: Payer: Self-pay | Admitting: *Deleted

## 2019-02-28 ENCOUNTER — Other Ambulatory Visit: Payer: Self-pay

## 2019-02-28 ENCOUNTER — Encounter: Payer: Self-pay | Admitting: Oncology

## 2019-02-28 VITALS — BP 139/93 | HR 97 | Temp 95.1°F | Wt 144.2 lb

## 2019-02-28 DIAGNOSIS — E538 Deficiency of other specified B group vitamins: Secondary | ICD-10-CM

## 2019-02-28 DIAGNOSIS — C3491 Malignant neoplasm of unspecified part of right bronchus or lung: Secondary | ICD-10-CM | POA: Diagnosis not present

## 2019-02-28 DIAGNOSIS — Z5111 Encounter for antineoplastic chemotherapy: Secondary | ICD-10-CM | POA: Diagnosis not present

## 2019-02-28 DIAGNOSIS — E876 Hypokalemia: Secondary | ICD-10-CM | POA: Diagnosis not present

## 2019-02-28 DIAGNOSIS — D539 Nutritional anemia, unspecified: Secondary | ICD-10-CM

## 2019-02-28 DIAGNOSIS — C3411 Malignant neoplasm of upper lobe, right bronchus or lung: Secondary | ICD-10-CM | POA: Diagnosis not present

## 2019-02-28 DIAGNOSIS — E860 Dehydration: Secondary | ICD-10-CM

## 2019-02-28 LAB — CBC WITH DIFFERENTIAL/PLATELET
Abs Immature Granulocytes: 0.02 10*3/uL (ref 0.00–0.07)
Basophils Absolute: 0 10*3/uL (ref 0.0–0.1)
Basophils Relative: 1 %
Eosinophils Absolute: 0.1 10*3/uL (ref 0.0–0.5)
Eosinophils Relative: 2 %
HCT: 28.9 % — ABNORMAL LOW (ref 39.0–52.0)
Hemoglobin: 10.3 g/dL — ABNORMAL LOW (ref 13.0–17.0)
Immature Granulocytes: 1 %
Lymphocytes Relative: 17 %
Lymphs Abs: 0.5 10*3/uL — ABNORMAL LOW (ref 0.7–4.0)
MCH: 36.7 pg — ABNORMAL HIGH (ref 26.0–34.0)
MCHC: 35.6 g/dL (ref 30.0–36.0)
MCV: 102.8 fL — ABNORMAL HIGH (ref 80.0–100.0)
Monocytes Absolute: 0.3 10*3/uL (ref 0.1–1.0)
Monocytes Relative: 11 %
Neutro Abs: 2 10*3/uL (ref 1.7–7.7)
Neutrophils Relative %: 68 %
Platelets: 198 10*3/uL (ref 150–400)
RBC: 2.81 MIL/uL — ABNORMAL LOW (ref 4.22–5.81)
RDW: 13.3 % (ref 11.5–15.5)
WBC: 2.9 10*3/uL — ABNORMAL LOW (ref 4.0–10.5)
nRBC: 0 % (ref 0.0–0.2)

## 2019-02-28 LAB — COMPREHENSIVE METABOLIC PANEL
ALT: 21 U/L (ref 0–44)
AST: 25 U/L (ref 15–41)
Albumin: 3.6 g/dL (ref 3.5–5.0)
Alkaline Phosphatase: 73 U/L (ref 38–126)
Anion gap: 11 (ref 5–15)
BUN: 9 mg/dL (ref 6–20)
CO2: 23 mmol/L (ref 22–32)
Calcium: 8.9 mg/dL (ref 8.9–10.3)
Chloride: 104 mmol/L (ref 98–111)
Creatinine, Ser: 0.76 mg/dL (ref 0.61–1.24)
GFR calc Af Amer: >60 mL/min (ref >60)
GFR calc non Af Amer: >60 mL/min (ref >60)
Glucose, Bld: 86 mg/dL (ref 70–99)
Potassium: 3.5 mmol/L (ref 3.5–5.1)
Sodium: 138 mmol/L (ref 135–145)
Total Bilirubin: 0.4 mg/dL (ref 0.3–1.2)
Total Protein: 7.2 g/dL (ref 6.5–8.1)

## 2019-02-28 LAB — MAGNESIUM: Magnesium: 1.5 mg/dL — ABNORMAL LOW (ref 1.7–2.4)

## 2019-02-28 MED ORDER — SODIUM CHLORIDE 0.9 % IV SOLN
239.2000 mg | Freq: Once | INTRAVENOUS | Status: AC
  Administered 2019-02-28: 240 mg via INTRAVENOUS
  Filled 2019-02-28: qty 24

## 2019-02-28 MED ORDER — PALONOSETRON HCL INJECTION 0.25 MG/5ML
0.2500 mg | Freq: Once | INTRAVENOUS | Status: AC
  Administered 2019-02-28: 0.25 mg via INTRAVENOUS
  Filled 2019-02-28: qty 5

## 2019-02-28 MED ORDER — FAMOTIDINE IN NACL 20-0.9 MG/50ML-% IV SOLN
20.0000 mg | Freq: Once | INTRAVENOUS | Status: AC
  Administered 2019-02-28: 20 mg via INTRAVENOUS
  Filled 2019-02-28: qty 50

## 2019-02-28 MED ORDER — SODIUM CHLORIDE 0.9 % IV SOLN
45.0000 mg/m2 | Freq: Once | INTRAVENOUS | Status: AC
  Administered 2019-02-28: 10:00:00 84 mg via INTRAVENOUS
  Filled 2019-02-28: qty 14

## 2019-02-28 MED ORDER — SODIUM CHLORIDE 0.9 % IV SOLN
20.0000 mg | Freq: Once | INTRAVENOUS | Status: AC
  Administered 2019-02-28: 20 mg via INTRAVENOUS
  Filled 2019-02-28: qty 2

## 2019-02-28 MED ORDER — MAGNESIUM CHLORIDE 64 MG PO TBEC: 1.0000 | DELAYED_RELEASE_TABLET | Freq: Two times a day (BID) | ORAL | 0 refills | Status: DC

## 2019-02-28 MED ORDER — MAGNESIUM SULFATE 2 GM/50ML IV SOLN
2.0000 g | Freq: Once | INTRAVENOUS | Status: AC
  Administered 2019-02-28: 2 g via INTRAVENOUS
  Filled 2019-02-28: qty 50

## 2019-02-28 MED ORDER — SODIUM CHLORIDE 0.9% FLUSH
10.0000 mL | Freq: Once | INTRAVENOUS | Status: AC
  Administered 2019-02-28: 10 mL via INTRAVENOUS
  Filled 2019-02-28: qty 10

## 2019-02-28 MED ORDER — HEPARIN SOD (PORK) LOCK FLUSH 100 UNIT/ML IV SOLN
500.0000 [IU] | Freq: Once | INTRAVENOUS | Status: AC | PRN
  Administered 2019-02-28: 500 [IU]
  Filled 2019-02-28: qty 5

## 2019-02-28 MED ORDER — SODIUM CHLORIDE 0.9 % IV SOLN
Freq: Once | INTRAVENOUS | Status: AC
  Administered 2019-02-28: 09:00:00 via INTRAVENOUS
  Filled 2019-02-28: qty 250

## 2019-02-28 MED ORDER — SODIUM CHLORIDE 0.9 % IV SOLN: 2.0000 g | Freq: Once | INTRAVENOUS | Status: DC

## 2019-02-28 MED ORDER — DIPHENHYDRAMINE HCL 50 MG/ML IJ SOLN
50.0000 mg | Freq: Once | INTRAMUSCULAR | Status: AC
  Administered 2019-02-28: 50 mg via INTRAVENOUS
  Filled 2019-02-28: qty 1

## 2019-02-28 NOTE — Progress Notes (Signed)
Hematology/Oncology Follow up note [facility] [phone] [phone]   Patient Care Team: Tracie Harrier, MD as PCP - General (Internal Medicine) [name], RN as Registered Nurse  REASON FOR VISIT:  Follow up for treatment of squamous lung cancer.   HISTORY OF PRESENTING ILLNESS:  # Dec 2019 Stage I squamous lung cancer #s/p Bronchoscopy on 06/25/2018. subcarina EBUS FNA was non diagnostic, hypocellular specimen.  # 08/13/2018 CT guided right upper lobe biopsy pathology showed dense fibrosis and mixed inflammatory cells with prominent polytypic plasma cells component. Focal benign bronchial wall and alveolar spaces. No malignancy was identified.   # 08/13/2018 underwent right thoracotomy and wedge resection of a right upper lobe mass.  Frozen section was consistent with an inflammatory process.  On the second postop day, preliminary pathology reports high-grade malignancy.  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  08/20/2018 Patient was therefore taken back to the OR and underwent take complete lobectomy  pT1b pN0 cM0 stage I squamous cell lung cancer.  Margin is negative.  Recommend observation.    # 12/03/2018 CT chest w contrast showed local recurrence.  12/10/2018 PET showed No evidence of distant metastatic disease # 12/26/2018 s/p bronchoscopy biopsy. Confirmed local recurrence of squamous cell lung cancer.   medi port placed by Dr.Oaks.  Cancer treatment Started concurrent chemoradiation on 01/16/2019 Carboplatin AUC of 2 and Taxol 45 mg/m2 weekly  INTERVAL HISTORY Samuel Choi is a 53 y.o. male who has above history reviewed by me today presents for evaluation prior to chemotherapy for treatment of  recurrent  squamous cell lung cancer. Status post 6 cycles carboplatin and Taxol concurrently with radiation Patient reports feeling well at baseline. Denies any diarrhea. Takes potassium 40 mEq daily. He takes low  magnesium 1 tablet daily. Denies any dizziness or lightheadedness.  Denies any fever, chills, nausea, vomiting, abdominal pain, neuropathy, palpitation, shortness of breath.   Review of Systems  Constitutional: Negative for appetite change, chills, fatigue, fever and unexpected weight change.  HENT:   Negative for hearing loss and voice change.   Eyes: Negative for eye problems and icterus.  Respiratory: Negative for chest tightness, cough and shortness of breath.   Cardiovascular: Negative for chest pain and leg swelling.  Gastrointestinal: Negative for abdominal distention, abdominal pain and diarrhea.  Endocrine: Negative for hot flashes.  Genitourinary: Negative for difficulty urinating, dysuria and frequency.   Musculoskeletal: Negative for arthralgias.  Skin: Negative for itching and rash.  Neurological: Negative for light-headedness and numbness.  Hematological: Negative for adenopathy. Does not bruise/bleed easily.  Psychiatric/Behavioral: Negative for confusion.    MEDICAL HISTORY:  Past Medical History:  Diagnosis Date  . Alcohol abuse    usually drinks 2-3 drinks per day  . Atherosclerosis 06/2018  . Chronic sinusitis   . Dehydration 02/07/2019  . Emphysema of lung ([place]) 06/2018   patient unaware of this.  . Hip fracture ([place]) 05/2018   no surgery  . History of kidney stones 05/2018   per xray, bilateral nephrolitiasis  . Hypertension   . Squamous cell carcinoma of lung, right ([place]) 06/2018    SURGICAL HISTORY: Past Surgical History:  Procedure Laterality Date  . BRAIN SURGERY  10/2017   nasal/sinus endoscopy. mass benign  . ELECTROMAGNETIC NAVIGATION BROCHOSCOPY Right 06/25/2018   Procedure: ELECTROMAGNETIC NAVIGATION BRONCHOSCOPY;  Surgeon: [name], MD;  Location: [facility] ORS;  Service: Cardiopulmonary;  Laterality: Right;  . ENDOBRONCHIAL ULTRASOUND Right 06/25/2018   Procedure: ENDOBRONCHIAL ULTRASOUND;  Surgeon: [name],  MD;  Location: [facility] OR[facility];   Service: Cardiopulmonary;  Laterality: Right;  . ENDOBRONCHIAL ULTRASOUND Right 12/26/2018   Procedure: ENDOBRONCHIAL ULTRASOUND RIGHT;  Surgeon: [name], MD;  Location: [facility] OR[facility];  Service: Cardiopulmonary;  Laterality: Right;  . FLEXIBLE BRONCHOSCOPY N/A 08/20/2018   Procedure: FLEXIBLE BRONCHOSCOPY PREOP;  Surgeon: [name], MD;  Location: [facility] OR[facility];  Service: Thoracic;  Laterality: N/A;  . NASAL SINUS SURGERY  10/2017   At [facility], frontal sinusotomy, ethmoidectomy, resection anterior cranial fossa neoplasm, turbinate resection  . PORTACATH PLACEMENT Left 01/15/2019   Procedure: INSERTION PORT-A-CATH;  Surgeon: [name], MD;  Location: [facility] OR[facility];  Service: General;  Laterality: Left;  . THORACOTOMY Right 08/13/2018   Procedure: PREOP BROCHOSCOPY WITH RIGHT THORACOTOMY AND RUL RESECTION;  Surgeon: [name], MD;  Location: [facility] OR[facility];  Service: General;  Laterality: Right;  . THORACOTOMY Right 08/20/2018   Procedure: THORACOTOMY MAJOR RIGHT UPPER LOBE LOBECTOMY;  Surgeon: [name], MD;  Location: [facility] OR[facility];  Service: Thoracic;  Laterality: Right;  . TOE SURGERY Left    pin in left toe    SOCIAL HISTORY: Social History   Socioeconomic History  . Marital status: Married    Spouse name: lisa  . Number of children: Not on file  . Years of education: Not on file  . Highest education level: Not on file  Occupational History  . 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 Financial resource strain: Not on file  . Food insecurity    Worry: Not on file    Inability: Not on file  . Transportation needs    Medical: Not on file    Non-medical: Not on file  Tobacco Use  . Smoking status: Former Smoker    Packs/day: 0.50    Years: 15.00    Pack years: 7.50    Types: Cigarettes    Quit date: 08/2018    Years since quitting: 0.5  . Smokeless tobacco: Never Used  Substance and Sexual Activity  . Alcohol use: Yes    Alcohol/week: 3.0  standard drinks    Types: 3 Cans of beer per week    Comment: usually 2 drinks per week, per patient  . Drug use: No  . Sexual activity: Not on file  Lifestyle  . Physical activity    Days per week: Not on file    Minutes per session: Not on file  . Stress: Not on file  Relationships  . Social Herbalist on phone: Not on file    Gets together: Not on file    Attends religious service: Not on file    Active member of club or organization: Not on file    Attends meetings of clubs or organizations: Not on file    Relationship status: Not on file  . Intimate partner violence    Fear of current or ex partner: Not on file    Emotionally abused: Not on file    Physically abused: Not on file    Forced sexual activity: Not on file  Other Topics Concern  . Not on file  Social History Narrative  . Not on file    FAMILY HISTORY: Family History  Problem Relation Age of Onset  . Breast cancer Mother   . Diabetes Mother   . Lung cancer Father   . Hypertension Father     ALLERGIES:  has No Known Allergies.  MEDICATIONS:  Current Outpatient Medications  Medication Sig Dispense Refill  . albuterol (VENTOLIN HFA) 108 (90 Base) MCG/ACT inhaler Inhale 2 puffs into the lungs every 6 (six) hours as needed for wheezing or shortness of breath. 8 g 3  . amLODipine (NORVASC) 10 MG tablet Take 10 mg by mouth daily.     . cloNIDine (CATAPRES) 0.1 MG tablet Take 0.1 mg by mouth daily.   5  . folic acid (V-R FOLIC ACID) 378 MCG tablet Take 1 tablet (400 mcg total) by mouth daily. 90 tablet 1  . hydrALAZINE (APRESOLINE) 100 MG tablet Take 50 mg by mouth 2 (two) times daily.     . hydrochlorothiazide (HYDRODIURIL) 12.5 MG tablet Take 12.5 mg by mouth daily.     Marland Kitchen lidocaine-prilocaine (EMLA) cream Apply to affected area once 30 g 3  . magnesium chloride (SLOW-MAG) 64 MG TBEC SR tablet Take 1 tablet (64 mg total) by mouth daily. 30 tablet 0  . methocarbamol (ROBAXIN) 500 MG tablet Take 1  tablet (500 mg total) by mouth at bedtime. 30 tablet 0  . olmesartan (BENICAR) 40 MG tablet Take 40 mg by mouth every other day.     . ondansetron (ZOFRAN) 8 MG tablet Take 1 tablet (8 mg total) by mouth 2 (two) times daily as needed for refractory nausea / vomiting. Start on day 3 after chemo. 30 tablet 1  . oxyCODONE-acetaminophen (PERCOCET) 5-325 MG tablet Take 1-2 tablets by mouth every 4 (four) hours as needed for severe pain. 20 tablet 0  . potassium chloride SA (K-DUR) 20 MEQ tablet Take 2 tablets (40 mEq total) by mouth daily. 30 tablet 0  . prochlorperazine (COMPAZINE) 10 MG tablet Take 1 tablet (10 mg total) by mouth every 6 (six) hours as needed (Nausea or vomiting). 30 tablet 1   No current facility-administered medications for this visit.    Facility-Administered Medications Ordered in Other Visits  Medication Dose Route Frequency Provider Last Rate Last Dose  . 0.9 %  sodium chloride infusion   Intravenous Once Earlie Server, MD      . albuterol (PROVENTIL) (2.5 MG/3ML) 0.083% nebulizer solution 2.5 mg  2.5 mg Nebulization Once System, Provider Not In      . CARBOplatin (PARAPLATIN) 240 mg in sodium chloride 0.9 % 250 mL chemo infusion  240 mg Intravenous Once Earlie Server, MD      . dexamethasone (DECADRON) 20 mg in sodium chloride 0.9 % 50 mL IVPB  20 mg Intravenous Once Earlie Server, MD      . diphenhydrAMINE (BENADRYL) injection 50 mg  50 mg Intravenous Once Earlie Server, MD      . famotidine (PEPCID) IVPB 20 mg premix  20 mg Intravenous Once Earlie Server, MD      . heparin lock flush 100 unit/mL  500 Units Intracatheter Once PRN Earlie Server, MD      . magnesium sulfate IVPB 2 g 50 mL  2 g Intravenous Once Earlie Server, MD      . PACLitaxel (TAXOL) 84 mg in sodium chloride 0.9 % 250 mL chemo infusion (</= 26m/m2)  45 mg/m2 (Treatment Plan Recorded) Intravenous Once YEarlie Server MD      . palonosetron (ALOXI) injection 0.25 mg  0.25 mg Intravenous Once YEarlie Server MD         PHYSICAL EXAMINATION: ECOG  PERFORMANCE STATUS: 0 - Asymptomatic Vitals:   02/28/19 0910  BP: (!) 139/93  Pulse: 97  Temp: (!) 95.1 F (35.1 C)   Filed Weights   02/28/19 0910  Weight: 144 lb 4 oz (65.4 kg)    Physical Exam Constitutional:      General: He is not in acute distress.    Comments: Thin  HENT:     Head: Normocephalic and atraumatic.  Eyes:     General: No scleral icterus.    Pupils: Pupils are equal, round, and reactive to light.  Neck:     Musculoskeletal: Normal range of motion and neck supple.  Cardiovascular:     Rate and Rhythm: Normal rate and regular rhythm.     Heart sounds: Normal heart sounds.  Pulmonary:     Effort: Pulmonary effort is normal. No respiratory distress.     Breath sounds: No wheezing.  Abdominal:     General: Bowel sounds are normal. There is no distension.     Palpations: Abdomen is soft. There is no mass.     Tenderness: There is no abdominal tenderness.  Musculoskeletal: Normal range of motion.        General: No deformity.  Skin:    General: Skin is warm and dry.     Findings: No erythema or rash.  Neurological:     Mental Status: He is alert and oriented to person, place, and time.     Cranial Nerves: No cranial nerve deficit.     Coordination: Coordination normal.  Psychiatric:        Behavior: Behavior normal.        Thought Content: Thought content normal.      LABORATORY DATA:  I have reviewed the data as listed Lab Results  Component Value Date   WBC 2.9 (L) 02/28/2019   HGB 10.3 (L) 02/28/2019   HCT 28.9 (L) 02/28/2019   MCV 102.8 (H) 02/28/2019   PLT 198 02/28/2019   Recent Labs    02/14/19 0803 02/21/19 0810 02/28/19 0826  NA 137 138 138  K 2.6* 3.4* 3.5  CL 104 104 104  CO2 _0 GLUCOSE 165* 96 86  BUN _1 CREATININE 0.83 0.71 0.76  CALCIUM 8.8* 8.7* 8.9  GFRNONAA >60 >60 >60  GFRAA >60 >60 >60  PROT 6.8 6.8 7.2  ALBUMIN 3.6 3.6 3.6  AST _2 ALT _3 ALKPHOS 64 67 73  BILITOT 0.5 0.4 0.4    Iron/TIBC/Ferritin/ %Sat No results found for: IRON, TIBC, FERRITIN, IRONPCTSAT   Pathology 12/26/2018 Right hilar adenopathy/mass-EUS guided FNA Positive for malignancy, non small cell carcinoma, morphologically similar to previous right upper lobe squamous cell carcinoma.   ASSESSMENT & PLAN:  1. Squamous cell carcinoma of lung, right ([place])   2. Hypomagnesemia   3. Hypokalemia   4. Encounter for antineoplastic chemotherapy   5. Folate deficiency   6. Macrocytic anemia    # Recurrent Squamous cell carcinoma of lung,  Labs are reviewed and discussed with patient. Counts acceptable proceed with cycle 7 carbo and Taxol. He is finishing radiation on 03/05/2019. Follow-up in 1 week for reevaluation of last concurrent chemotherapy  #Hypokalemia, continue oral potassium chloride 40 mEq daily.  Potassium level 3.5 today.  Stable. #Hypo-magnesium, likely secondary to electrolyte wasting. Increase slow mag to 1 tablet twice daily. Proceed with 2 g of magnesium sulfate IV x1 today. Repeat magnesium level in 3 days, +/-magnesium infusion.  # Folate deficiency, continue folic acid #Wheezing right lower lobe, resolved continue Albuterol inhaler every 6 hours as needed for wheezing.    Return of visit: 1 weeks.    Earlie Server,  MD, PhD Hematology Oncology [facility] 02/28/2019

## 2019-03-03 ENCOUNTER — Other Ambulatory Visit: Payer: Self-pay

## 2019-03-03 ENCOUNTER — Ambulatory Visit
Admission: RE | Admit: 2019-03-03 | Discharge: 2019-03-03 | Disposition: A | Discharge status: Home / Self Care | Payer: BC Managed Care – PPO | Source: Ambulatory Visit | Attending: Radiation Oncology | Admitting: Radiation Oncology

## 2019-03-03 DIAGNOSIS — C3411 Malignant neoplasm of upper lobe, right bronchus or lung: Secondary | ICD-10-CM | POA: Diagnosis not present

## 2019-03-04 ENCOUNTER — Ambulatory Visit
Admission: RE | Admit: 2019-03-04 | Discharge: 2019-03-04 | Disposition: A | Discharge status: Home / Self Care | Payer: BC Managed Care – PPO | Source: Ambulatory Visit | Attending: Radiation Oncology | Admitting: Radiation Oncology

## 2019-03-04 ENCOUNTER — Other Ambulatory Visit: Payer: Self-pay

## 2019-03-04 ENCOUNTER — Inpatient Hospital Stay: Payer: BC Managed Care – PPO

## 2019-03-04 DIAGNOSIS — E876 Hypokalemia: Secondary | ICD-10-CM

## 2019-03-04 DIAGNOSIS — Z95828 Presence of other vascular implants and grafts: Secondary | ICD-10-CM

## 2019-03-04 DIAGNOSIS — C3491 Malignant neoplasm of unspecified part of right bronchus or lung: Secondary | ICD-10-CM

## 2019-03-04 DIAGNOSIS — D500 Iron deficiency anemia secondary to blood loss (chronic): Secondary | ICD-10-CM

## 2019-03-04 DIAGNOSIS — C3411 Malignant neoplasm of upper lobe, right bronchus or lung: Secondary | ICD-10-CM | POA: Diagnosis not present

## 2019-03-04 LAB — CBC WITH DIFFERENTIAL/PLATELET
Abs Immature Granulocytes: 0.03 10*3/uL (ref 0.00–0.07)
Basophils Absolute: 0 10*3/uL (ref 0.0–0.1)
Basophils Relative: 0 %
Eosinophils Absolute: 0 10*3/uL (ref 0.0–0.5)
Eosinophils Relative: 0 %
HCT: 27.8 % — ABNORMAL LOW (ref 39.0–52.0)
Hemoglobin: 9.9 g/dL — ABNORMAL LOW (ref 13.0–17.0)
Immature Granulocytes: 1 %
Lymphocytes Relative: 14 %
Lymphs Abs: 0.7 10*3/uL (ref 0.7–4.0)
MCH: 37.6 pg — ABNORMAL HIGH (ref 26.0–34.0)
MCHC: 35.6 g/dL (ref 30.0–36.0)
MCV: 105.7 fL — ABNORMAL HIGH (ref 80.0–100.0)
Monocytes Absolute: 0.4 10*3/uL (ref 0.1–1.0)
Monocytes Relative: 9 %
Neutro Abs: 3.6 10*3/uL (ref 1.7–7.7)
Neutrophils Relative %: 76 %
Platelets: 202 10*3/uL (ref 150–400)
RBC: 2.63 MIL/uL — ABNORMAL LOW (ref 4.22–5.81)
RDW: 14.4 % (ref 11.5–15.5)
WBC: 4.7 10*3/uL (ref 4.0–10.5)
nRBC: 1.1 % — ABNORMAL HIGH (ref 0.0–0.2)

## 2019-03-04 LAB — COMPREHENSIVE METABOLIC PANEL
ALT: 19 U/L (ref 0–44)
AST: 23 U/L (ref 15–41)
Albumin: 3.4 g/dL — ABNORMAL LOW (ref 3.5–5.0)
Alkaline Phosphatase: 73 U/L (ref 38–126)
Anion gap: 9 (ref 5–15)
BUN: 13 mg/dL (ref 6–20)
CO2: 24 mmol/L (ref 22–32)
Calcium: 8.7 mg/dL — ABNORMAL LOW (ref 8.9–10.3)
Chloride: 108 mmol/L (ref 98–111)
Creatinine, Ser: 0.79 mg/dL (ref 0.61–1.24)
GFR calc Af Amer: >60 mL/min (ref >60)
GFR calc non Af Amer: >60 mL/min (ref >60)
Glucose, Bld: 132 mg/dL — ABNORMAL HIGH (ref 70–99)
Potassium: 3.5 mmol/L (ref 3.5–5.1)
Sodium: 141 mmol/L (ref 135–145)
Total Bilirubin: 0.4 mg/dL (ref 0.3–1.2)
Total Protein: 6.6 g/dL (ref 6.5–8.1)

## 2019-03-04 LAB — MAGNESIUM: Magnesium: 1.7 mg/dL (ref 1.7–2.4)

## 2019-03-04 MED ORDER — HEPARIN SOD (PORK) LOCK FLUSH 100 UNIT/ML IV SOLN
INTRAVENOUS | Status: AC
  Filled 2019-03-04: qty 5

## 2019-03-04 MED ORDER — HEPARIN SOD (PORK) LOCK FLUSH 100 UNIT/ML IV SOLN
500.0000 [IU] | Freq: Once | INTRAVENOUS | Status: AC
  Administered 2019-03-04: 500 [IU] via INTRAVENOUS

## 2019-03-04 MED ORDER — SODIUM CHLORIDE 0.9% FLUSH
10.0000 mL | Freq: Once | INTRAVENOUS | Status: AC
  Administered 2019-03-04: 10 mL via INTRAVENOUS
  Filled 2019-03-04: qty 10

## 2019-03-05 ENCOUNTER — Ambulatory Visit
Admission: RE | Admit: 2019-03-05 | Discharge: 2019-03-05 | Disposition: A | Discharge status: Home / Self Care | Payer: BC Managed Care – PPO | Source: Ambulatory Visit | Attending: Radiation Oncology | Admitting: Radiation Oncology

## 2019-03-05 ENCOUNTER — Ambulatory Visit: Payer: BC Managed Care – PPO

## 2019-03-05 ENCOUNTER — Other Ambulatory Visit: Payer: Self-pay

## 2019-03-05 DIAGNOSIS — C3411 Malignant neoplasm of upper lobe, right bronchus or lung: Secondary | ICD-10-CM | POA: Diagnosis not present

## 2019-03-06 ENCOUNTER — Ambulatory Visit: Payer: BC Managed Care – PPO

## 2019-03-07 ENCOUNTER — Inpatient Hospital Stay: Payer: BC Managed Care – PPO

## 2019-03-07 ENCOUNTER — Other Ambulatory Visit: Payer: Self-pay

## 2019-03-07 ENCOUNTER — Inpatient Hospital Stay (HOSPITAL_BASED_OUTPATIENT_CLINIC_OR_DEPARTMENT_OTHER): Payer: BC Managed Care – PPO | Admitting: Oncology

## 2019-03-07 ENCOUNTER — Encounter: Payer: Self-pay | Admitting: Oncology

## 2019-03-07 DIAGNOSIS — Z5111 Encounter for antineoplastic chemotherapy: Secondary | ICD-10-CM | POA: Diagnosis not present

## 2019-03-07 DIAGNOSIS — C3491 Malignant neoplasm of unspecified part of right bronchus or lung: Secondary | ICD-10-CM | POA: Diagnosis not present

## 2019-03-07 DIAGNOSIS — E876 Hypokalemia: Secondary | ICD-10-CM | POA: Diagnosis not present

## 2019-03-07 DIAGNOSIS — Z95828 Presence of other vascular implants and grafts: Secondary | ICD-10-CM

## 2019-03-07 DIAGNOSIS — E860 Dehydration: Secondary | ICD-10-CM

## 2019-03-07 DIAGNOSIS — C3411 Malignant neoplasm of upper lobe, right bronchus or lung: Secondary | ICD-10-CM | POA: Diagnosis not present

## 2019-03-07 LAB — COMPREHENSIVE METABOLIC PANEL
ALT: 20 U/L (ref 0–44)
AST: 30 U/L (ref 15–41)
Albumin: 3.4 g/dL — ABNORMAL LOW (ref 3.5–5.0)
Alkaline Phosphatase: 77 U/L (ref 38–126)
Anion gap: 10 (ref 5–15)
BUN: 10 mg/dL (ref 6–20)
CO2: 24 mmol/L (ref 22–32)
Calcium: 8.6 mg/dL — ABNORMAL LOW (ref 8.9–10.3)
Chloride: 104 mmol/L (ref 98–111)
Creatinine, Ser: 0.79 mg/dL (ref 0.61–1.24)
GFR calc Af Amer: >60 mL/min (ref >60)
GFR calc non Af Amer: >60 mL/min (ref >60)
Glucose, Bld: 93 mg/dL (ref 70–99)
Potassium: 3.3 mmol/L — ABNORMAL LOW (ref 3.5–5.1)
Sodium: 138 mmol/L (ref 135–145)
Total Bilirubin: 0.3 mg/dL (ref 0.3–1.2)
Total Protein: 6.8 g/dL (ref 6.5–8.1)

## 2019-03-07 LAB — CBC WITH DIFFERENTIAL/PLATELET
Abs Immature Granulocytes: 0.03 10*3/uL (ref 0.00–0.07)
Basophils Absolute: 0 10*3/uL (ref 0.0–0.1)
Basophils Relative: 0 %
Eosinophils Absolute: 0 10*3/uL (ref 0.0–0.5)
Eosinophils Relative: 0 %
HCT: 27.9 % — ABNORMAL LOW (ref 39.0–52.0)
Hemoglobin: 10 g/dL — ABNORMAL LOW (ref 13.0–17.0)
Immature Granulocytes: 1 %
Lymphocytes Relative: 13 %
Lymphs Abs: 0.7 10*3/uL (ref 0.7–4.0)
MCH: 37.7 pg — ABNORMAL HIGH (ref 26.0–34.0)
MCHC: 35.8 g/dL (ref 30.0–36.0)
MCV: 105.3 fL — ABNORMAL HIGH (ref 80.0–100.0)
Monocytes Absolute: 0.6 10*3/uL (ref 0.1–1.0)
Monocytes Relative: 11 %
Neutro Abs: 4 10*3/uL (ref 1.7–7.7)
Neutrophils Relative %: 75 %
Platelets: 198 10*3/uL (ref 150–400)
RBC: 2.65 MIL/uL — ABNORMAL LOW (ref 4.22–5.81)
RDW: 14.7 % (ref 11.5–15.5)
WBC: 5.3 10*3/uL (ref 4.0–10.5)
nRBC: 0.4 % — ABNORMAL HIGH (ref 0.0–0.2)

## 2019-03-07 LAB — MAGNESIUM: Magnesium: 1.6 mg/dL — ABNORMAL LOW (ref 1.7–2.4)

## 2019-03-07 MED ORDER — SODIUM CHLORIDE 0.9% FLUSH
10.0000 mL | Freq: Once | INTRAVENOUS | Status: AC
  Administered 2019-03-07: 10 mL via INTRAVENOUS
  Filled 2019-03-07: qty 10

## 2019-03-07 MED ORDER — FAMOTIDINE IN NACL 20-0.9 MG/50ML-% IV SOLN
20.0000 mg | Freq: Once | INTRAVENOUS | Status: AC
  Administered 2019-03-07: 20 mg via INTRAVENOUS
  Filled 2019-03-07: qty 50

## 2019-03-07 MED ORDER — SODIUM CHLORIDE 0.9 % IV SOLN
239.2000 mg | Freq: Once | INTRAVENOUS | Status: AC
  Administered 2019-03-07: 240 mg via INTRAVENOUS
  Filled 2019-03-07: qty 24

## 2019-03-07 MED ORDER — DIPHENHYDRAMINE HCL 50 MG/ML IJ SOLN
50.0000 mg | Freq: Once | INTRAMUSCULAR | Status: AC
  Administered 2019-03-07: 50 mg via INTRAVENOUS
  Filled 2019-03-07: qty 1

## 2019-03-07 MED ORDER — SODIUM CHLORIDE 0.9 % IV SOLN
Freq: Once | INTRAVENOUS | Status: AC
  Administered 2019-03-07: 09:00:00 via INTRAVENOUS
  Filled 2019-03-07: qty 250

## 2019-03-07 MED ORDER — SODIUM CHLORIDE 0.9 % IV SOLN: 2.0000 g | Freq: Once | INTRAVENOUS | Status: DC

## 2019-03-07 MED ORDER — SODIUM CHLORIDE 0.9 % IV SOLN
45.0000 mg/m2 | Freq: Once | INTRAVENOUS | Status: AC
  Administered 2019-03-07: 84 mg via INTRAVENOUS
  Filled 2019-03-07: qty 14

## 2019-03-07 MED ORDER — SODIUM CHLORIDE 0.9 % IV SOLN
20.0000 mg | Freq: Once | INTRAVENOUS | Status: AC
  Administered 2019-03-07: 20 mg via INTRAVENOUS
  Filled 2019-03-07: qty 2

## 2019-03-07 MED ORDER — HEPARIN SOD (PORK) LOCK FLUSH 100 UNIT/ML IV SOLN
500.0000 [IU] | Freq: Once | INTRAVENOUS | Status: AC | PRN
  Administered 2019-03-07: 500 [IU]
  Filled 2019-03-07: qty 5

## 2019-03-07 MED ORDER — MAGNESIUM SULFATE 2 GM/50ML IV SOLN
2.0000 g | Freq: Once | INTRAVENOUS | Status: AC
  Administered 2019-03-07: 2 g via INTRAVENOUS
  Filled 2019-03-07: qty 50

## 2019-03-07 MED ORDER — PALONOSETRON HCL INJECTION 0.25 MG/5ML
0.2500 mg | Freq: Once | INTRAVENOUS | Status: AC
  Administered 2019-03-07: 0.25 mg via INTRAVENOUS
  Filled 2019-03-07: qty 5

## 2019-03-07 NOTE — Progress Notes (Signed)
Patient is here for follow up he is doing well no complaints  

## 2019-03-07 NOTE — Progress Notes (Signed)
Hematology/Oncology Follow up note [facility] [phone] [phone]   Patient Care Team: Tracie Harrier, MD as PCP - General (Internal Medicine) [name], RN as Registered Nurse  REASON FOR VISIT:  Follow up for treatment of squamous lung cancer.   HISTORY OF PRESENTING ILLNESS:  # Dec 2019 Stage I squamous lung cancer #s/p Bronchoscopy on 06/25/2018. subcarina EBUS FNA was non diagnostic, hypocellular specimen.  # 08/13/2018 CT guided right upper lobe biopsy pathology showed dense fibrosis and mixed inflammatory cells with prominent polytypic plasma cells component. Focal benign bronchial wall and alveolar spaces. No malignancy was identified.   # 08/13/2018 underwent right thoracotomy and wedge resection of a right upper lobe mass.  Frozen section was consistent with an inflammatory process.  On the second postop day, preliminary pathology reports high-grade malignancy.  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  08/20/2018 Patient was therefore taken back to the OR and underwent take complete lobectomy  pT1b pN0 cM0 stage I squamous cell lung cancer.  Margin is negative.  Recommend observation.    # 12/03/2018 CT chest w contrast showed local recurrence.  12/10/2018 PET showed No evidence of distant metastatic disease # 12/26/2018 s/p bronchoscopy biopsy. Confirmed local recurrence of squamous cell lung cancer.   medi port placed by Dr.Oaks.  Cancer treatment Started concurrent chemoradiation on 01/16/2019 Carboplatin AUC of 2 and Taxol 45 mg/m2 weekly  INTERVAL HISTORY Samuel Choi is a 53 y.o. male who has above history reviewed by me today presents for evaluation prior to chemotherapy for treatment of  recurrent  squamous cell lung cancer. Status post 6 cycles carboplatin and Taxol concurrently with radiation Patient reports feeling well at baseline.  Denies any nausea, vomiting, fever, chills, abdominal pain, neuropathy,  palpitation, shortness of breath. He takes potassium 40 mEq daily. Also Slow-Mag 1 tablet twice daily. Denies any dizziness or lightheadedness.   Review of Systems  Constitutional: Negative for appetite change, chills, fatigue, fever and unexpected weight change.  HENT:   Negative for hearing loss and voice change.   Eyes: Negative for eye problems and icterus.  Respiratory: Negative for chest tightness, cough and shortness of breath.   Cardiovascular: Negative for chest pain and leg swelling.  Gastrointestinal: Negative for abdominal distention, abdominal pain and diarrhea.  Endocrine: Negative for hot flashes.  Genitourinary: Negative for difficulty urinating, dysuria and frequency.   Musculoskeletal: Negative for arthralgias.  Skin: Negative for itching and rash.  Neurological: Negative for light-headedness and numbness.  Hematological: Negative for adenopathy. Does not bruise/bleed easily.  Psychiatric/Behavioral: Negative for confusion.    MEDICAL HISTORY:  Past Medical History:  Diagnosis Date  . Alcohol abuse    usually drinks 2-3 drinks per day  . Atherosclerosis 06/2018  . Chronic sinusitis   . Dehydration 02/07/2019  . Emphysema of lung ([place]) 06/2018   patient unaware of this.  . Hip fracture ([place]) 05/2018   no surgery  . History of kidney stones 05/2018   per xray, bilateral nephrolitiasis  . Hypertension   . Squamous cell carcinoma of lung, right ([place]) 06/2018    SURGICAL HISTORY: Past Surgical History:  Procedure Laterality Date  . BRAIN SURGERY  10/2017   nasal/sinus endoscopy. mass benign  . ELECTROMAGNETIC NAVIGATION BROCHOSCOPY Right 06/25/2018   Procedure: ELECTROMAGNETIC NAVIGATION BRONCHOSCOPY;  Surgeon: [name], MD;  Location: [facility] ORS;  Service: Cardiopulmonary;  Laterality: Right;  . ENDOBRONCHIAL ULTRASOUND Right 06/25/2018   Procedure: ENDOBRONCHIAL ULTRASOUND;  Surgeon: [name], MD;  Location:  [facility] OR[facility];  Service: Cardiopulmonary;   Laterality: Right;  . ENDOBRONCHIAL ULTRASOUND Right 12/26/2018   Procedure: ENDOBRONCHIAL ULTRASOUND RIGHT;  Surgeon: [name], MD;  Location: [facility] OR[facility];  Service: Cardiopulmonary;  Laterality: Right;  . FLEXIBLE BRONCHOSCOPY N/A 08/20/2018   Procedure: FLEXIBLE BRONCHOSCOPY PREOP;  Surgeon: [name], MD;  Location: [facility] ORS;  Service: Thoracic;  Laterality: N/A;  . NASAL SINUS SURGERY  10/2017   At [facility], frontal sinusotomy, ethmoidectomy, resection anterior cranial fossa neoplasm, turbinate resection  . PORTACATH PLACEMENT Left 01/15/2019   Procedure: INSERTION PORT-A-CATH;  Surgeon: [name], MD;  Location: [facility] ORS;  Service: General;  Laterality: Left;  . THORACOTOMY Right 08/13/2018   Procedure: PREOP BROCHOSCOPY WITH RIGHT THORACOTOMY AND RUL RESECTION;  Surgeon: [name], MD;  Location: [facility] OR[facility];  Service: General;  Laterality: Right;  . THORACOTOMY Right 08/20/2018   Procedure: THORACOTOMY MAJOR RIGHT UPPER LOBE LOBECTOMY;  Surgeon: [name], MD;  Location: [facility] OR[facility];  Service: Thoracic;  Laterality: Right;  . TOE SURGERY Left    pin in left toe    SOCIAL HISTORY: Social History   Socioeconomic History  . Marital status: Married    Spouse name: lisa  . Number of children: Not on file  . Years of education: Not on file  . Highest education level: Not on file  Occupational History  . 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 Financial resource strain: Not on file  . Food insecurity    Worry: Not on file    Inability: Not on file  . Transportation needs    Medical: Not on file    Non-medical: Not on file  Tobacco Use  . Smoking status: Former Smoker    Packs/day: 0.50    Years: 15.00    Pack years: 7.50    Types: Cigarettes    Quit date: 08/2018    Years since quitting: 0.5  . Smokeless tobacco: Never Used  Substance and Sexual Activity  . Alcohol use: Yes    Alcohol/week: 3.0 standard drinks    Types: 3  Cans of beer per week    Comment: usually 2 drinks per week, per patient  . Drug use: No  . Sexual activity: Not on file  Lifestyle  . Physical activity    Days per week: Not on file    Minutes per session: Not on file  . Stress: Not on file  Relationships  . Social Herbalist on phone: Not on file    Gets together: Not on file    Attends religious service: Not on file    Active member of club or organization: Not on file    Attends meetings of clubs or organizations: Not on file    Relationship status: Not on file  . Intimate partner violence    Fear of current or ex partner: Not on file    Emotionally abused: Not on file    Physically abused: Not on file    Forced sexual activity: Not on file  Other Topics Concern  . Not on file  Social History Narrative  . Not on file    FAMILY HISTORY: Family History  Problem Relation Age of Onset  . Breast cancer Mother   . Diabetes Mother   . Lung cancer Father   . Hypertension Father     ALLERGIES:  has No Known Allergies.  MEDICATIONS:  Current Outpatient Medications  Medication Sig Dispense  Refill  . albuterol (VENTOLIN HFA) 108 (90 Base) MCG/ACT inhaler Inhale 2 puffs into the lungs every 6 (six) hours as needed for wheezing or shortness of breath. 8 g 3  . amLODipine (NORVASC) 10 MG tablet Take 10 mg by mouth daily.     . cloNIDine (CATAPRES) 0.1 MG tablet Take 0.1 mg by mouth daily.   5  . folic acid (V-R FOLIC ACID) 884 MCG tablet Take 1 tablet (400 mcg total) by mouth daily. 90 tablet 1  . hydrALAZINE (APRESOLINE) 100 MG tablet Take 50 mg by mouth 2 (two) times daily.     . hydrochlorothiazide (HYDRODIURIL) 12.5 MG tablet Take 12.5 mg by mouth daily.     Marland Kitchen lidocaine-prilocaine (EMLA) cream Apply to affected area once 30 g 3  . magnesium chloride (SLOW-MAG) 64 MG TBEC SR tablet Take 1 tablet (64 mg total) by mouth 2 (two) times daily. 60 tablet 0  . methocarbamol (ROBAXIN) 500 MG tablet Take 1 tablet (500 mg  total) by mouth at bedtime. 30 tablet 0  . olmesartan (BENICAR) 40 MG tablet Take 40 mg by mouth every other day.     . ondansetron (ZOFRAN) 8 MG tablet Take 1 tablet (8 mg total) by mouth 2 (two) times daily as needed for refractory nausea / vomiting. Start on day 3 after chemo. 30 tablet 1  . oxyCODONE-acetaminophen (PERCOCET) 5-325 MG tablet Take 1-2 tablets by mouth every 4 (four) hours as needed for severe pain. 20 tablet 0  . potassium chloride SA (K-DUR) 20 MEQ tablet Take 2 tablets (40 mEq total) by mouth daily. 30 tablet 0  . prochlorperazine (COMPAZINE) 10 MG tablet Take 1 tablet (10 mg total) by mouth every 6 (six) hours as needed (Nausea or vomiting). 30 tablet 1   No current facility-administered medications for this visit.    Facility-Administered Medications Ordered in Other Visits  Medication Dose Route Frequency Provider Last Rate Last Dose  . albuterol (PROVENTIL) (2.5 MG/3ML) 0.083% nebulizer solution 2.5 mg  2.5 mg Nebulization Once System, Provider Not In         PHYSICAL EXAMINATION: ECOG PERFORMANCE STATUS: 0 - Asymptomatic Vitals:   03/07/19 0844  BP: (!) 128/91  Pulse: 99  Resp: 18  Temp: (!) 96.2 F (35.7 C)  SpO2: 98%   Filed Weights   03/07/19 0844  Weight: 145 lb (65.8 kg)    Physical Exam Constitutional:      General: He is not in acute distress.    Comments: Thin  HENT:     Head: Normocephalic and atraumatic.  Eyes:     General: No scleral icterus.    Pupils: Pupils are equal, round, and reactive to light.  Neck:     Musculoskeletal: Normal range of motion and neck supple.  Cardiovascular:     Rate and Rhythm: Normal rate and regular rhythm.     Heart sounds: Normal heart sounds.  Pulmonary:     Effort: Pulmonary effort is normal. No respiratory distress.     Comments: Wheezes has improved. Abdominal:     General: Bowel sounds are normal. There is no distension.     Palpations: Abdomen is soft. There is no mass.     Tenderness: There  is no abdominal tenderness.  Musculoskeletal: Normal range of motion.        General: No deformity.  Skin:    General: Skin is warm and dry.     Findings: No erythema or rash.  Neurological:  Mental Status: He is alert and oriented to person, place, and time.     Cranial Nerves: No cranial nerve deficit.     Coordination: Coordination normal.  Psychiatric:        Behavior: Behavior normal.        Thought Content: Thought content normal.      LABORATORY DATA:  I have reviewed the data as listed Lab Results  Component Value Date   WBC 5.3 03/07/2019   HGB 10.0 (L) 03/07/2019   HCT 27.9 (L) 03/07/2019   MCV 105.3 (H) 03/07/2019   PLT 198 03/07/2019   Recent Labs    02/28/19 0826 03/04/19 0815 03/07/19 0817  NA 138 141 138  K 3.5 3.5 3.3*  CL 104 108 104  CO2 _0 GLUCOSE 86 132* 93  BUN _1 CREATININE 0.76 0.79 0.79  CALCIUM 8.9 8.7* 8.6*  GFRNONAA >60 >60 >60  GFRAA >60 >60 >60  PROT 7.2 6.6 6.8  ALBUMIN 3.6 3.4* 3.4*  AST _2 ALT _3 ALKPHOS 73 73 77  BILITOT 0.4 0.4 0.3   Iron/TIBC/Ferritin/ %Sat No results found for: IRON, TIBC, FERRITIN, IRONPCTSAT   Pathology 12/26/2018 Right hilar adenopathy/mass-EUS guided FNA Positive for malignancy, non small cell carcinoma, morphologically similar to previous right upper lobe squamous cell carcinoma.   ASSESSMENT & PLAN:  1. Hypomagnesemia   2. Squamous cell carcinoma of lung, right ([place])   3. Hypokalemia   4. Encounter for antineoplastic chemotherapy    # Recurrent Squamous cell carcinoma of lung,  Finishing concurrent chemoradiation. Labs reviewed and discussed with patient .  Counts acceptable to proceed with cycle 8 carbo and Taxol. This will be his last cycle of carboplatin and Taxol. Radiation has been finished this week 2. He will need to repeat a CT scan in 4 weeks and follow-up in the clinic for evaluation of starting maintenance durvalumab treatments.  I discussed the  mechanism of action and rationale of using immunotherapy. Discussed the potential side effects of immunotherapy including but not limited to diarrhea; skin rash; respiratory failure, kidney failure, mental status change, elevated LFTs/liver failure,endocrine abnormalities, acute deterioration  and even death,etc. patient voices understanding and agrees with proceeding with treatment.  #Hypokalemia, continue oral potassium chloride 40 mEq daily.  Potassium level 3.3 today.  Continue to monitor.   #Hypo-magnesium, likely secondary to electrolyte wasting. Continue Slow-Mag 1 tablet twice daily. Proceed with 2 g of IV magnesium sulfate today. Repeat magnesium level in 1 week+/-magnesium infusion.  # Folate deficiency, continue folic acid. #Wheezing right lower lobe, improved.  Continue Albuterol inhaler every 6 hours as needed for wheezing.    Return of visit: 1 weeks.    Earlie Server, MD, PhD Hematology Oncology [place] [facility] 03/07/2019

## 2019-03-07 NOTE — Progress Notes (Signed)
Lab results reviewed with MD, new orders place. Pt updated and all questions answered at this time.   Samuel Choi [employer]

## 2019-03-13 ENCOUNTER — Ambulatory Visit: Payer: BC Managed Care – PPO

## 2019-03-13 ENCOUNTER — Other Ambulatory Visit: Payer: BC Managed Care – PPO

## 2019-03-13 ENCOUNTER — Encounter: Payer: Self-pay | Admitting: *Deleted

## 2019-03-13 NOTE — Patient Instructions (Signed)
[employer] Disability forms completed for patient, scanned to clinic staff to obtain MD signature and fax.

## 2019-03-14 ENCOUNTER — Other Ambulatory Visit: Payer: Self-pay

## 2019-03-14 ENCOUNTER — Inpatient Hospital Stay: Payer: BC Managed Care – PPO

## 2019-03-14 ENCOUNTER — Other Ambulatory Visit: Payer: Self-pay | Admitting: Oncology

## 2019-03-14 ENCOUNTER — Inpatient Hospital Stay: Payer: BC Managed Care – PPO | Attending: Oncology

## 2019-03-14 DIAGNOSIS — E860 Dehydration: Secondary | ICD-10-CM

## 2019-03-14 DIAGNOSIS — D7589 Other specified diseases of blood and blood-forming organs: Secondary | ICD-10-CM | POA: Diagnosis not present

## 2019-03-14 DIAGNOSIS — C3491 Malignant neoplasm of unspecified part of right bronchus or lung: Secondary | ICD-10-CM | POA: Diagnosis not present

## 2019-03-14 DIAGNOSIS — F1010 Alcohol abuse, uncomplicated: Secondary | ICD-10-CM | POA: Insufficient documentation

## 2019-03-14 DIAGNOSIS — I10 Essential (primary) hypertension: Secondary | ICD-10-CM | POA: Diagnosis not present

## 2019-03-14 DIAGNOSIS — E538 Deficiency of other specified B group vitamins: Secondary | ICD-10-CM | POA: Insufficient documentation

## 2019-03-14 DIAGNOSIS — E876 Hypokalemia: Secondary | ICD-10-CM | POA: Diagnosis not present

## 2019-03-14 DIAGNOSIS — Z95828 Presence of other vascular implants and grafts: Secondary | ICD-10-CM

## 2019-03-14 DIAGNOSIS — Z5111 Encounter for antineoplastic chemotherapy: Secondary | ICD-10-CM

## 2019-03-14 DIAGNOSIS — D696 Thrombocytopenia, unspecified: Secondary | ICD-10-CM | POA: Insufficient documentation

## 2019-03-14 DIAGNOSIS — Z803 Family history of malignant neoplasm of breast: Secondary | ICD-10-CM | POA: Insufficient documentation

## 2019-03-14 DIAGNOSIS — J439 Emphysema, unspecified: Secondary | ICD-10-CM | POA: Insufficient documentation

## 2019-03-14 DIAGNOSIS — Z801 Family history of malignant neoplasm of trachea, bronchus and lung: Secondary | ICD-10-CM | POA: Insufficient documentation

## 2019-03-14 DIAGNOSIS — Z87891 Personal history of nicotine dependence: Secondary | ICD-10-CM | POA: Insufficient documentation

## 2019-03-14 DIAGNOSIS — Z452 Encounter for adjustment and management of vascular access device: Secondary | ICD-10-CM | POA: Diagnosis present

## 2019-03-14 DIAGNOSIS — Z79899 Other long term (current) drug therapy: Secondary | ICD-10-CM | POA: Diagnosis not present

## 2019-03-14 LAB — CBC WITH DIFFERENTIAL/PLATELET
Abs Immature Granulocytes: 0.04 10*3/uL (ref 0.00–0.07)
Basophils Absolute: 0 10*3/uL (ref 0.0–0.1)
Basophils Relative: 1 %
Eosinophils Absolute: 0 10*3/uL (ref 0.0–0.5)
Eosinophils Relative: 0 %
HCT: 28.1 % — ABNORMAL LOW (ref 39.0–52.0)
Hemoglobin: 10 g/dL — ABNORMAL LOW (ref 13.0–17.0)
Immature Granulocytes: 1 %
Lymphocytes Relative: 11 %
Lymphs Abs: 0.7 10*3/uL (ref 0.7–4.0)
MCH: 38.5 pg — ABNORMAL HIGH (ref 26.0–34.0)
MCHC: 35.6 g/dL (ref 30.0–36.0)
MCV: 108.1 fL — ABNORMAL HIGH (ref 80.0–100.0)
Monocytes Absolute: 0.7 10*3/uL (ref 0.1–1.0)
Monocytes Relative: 11 %
Neutro Abs: 5.4 10*3/uL (ref 1.7–7.7)
Neutrophils Relative %: 76 %
Platelets: 147 10*3/uL — ABNORMAL LOW (ref 150–400)
RBC: 2.6 MIL/uL — ABNORMAL LOW (ref 4.22–5.81)
RDW: 16.5 % — ABNORMAL HIGH (ref 11.5–15.5)
WBC: 7 10*3/uL (ref 4.0–10.5)
nRBC: 0.7 % — ABNORMAL HIGH (ref 0.0–0.2)

## 2019-03-14 LAB — COMPREHENSIVE METABOLIC PANEL
ALT: 23 U/L (ref 0–44)
AST: 27 U/L (ref 15–41)
Albumin: 3.8 g/dL (ref 3.5–5.0)
Alkaline Phosphatase: 73 U/L (ref 38–126)
Anion gap: 10 (ref 5–15)
BUN: 11 mg/dL (ref 6–20)
CO2: 26 mmol/L (ref 22–32)
Calcium: 9.2 mg/dL (ref 8.9–10.3)
Chloride: 104 mmol/L (ref 98–111)
Creatinine, Ser: 0.89 mg/dL (ref 0.61–1.24)
GFR calc Af Amer: >60 mL/min (ref >60)
GFR calc non Af Amer: >60 mL/min (ref >60)
Glucose, Bld: 95 mg/dL (ref 70–99)
Potassium: 3.2 mmol/L — ABNORMAL LOW (ref 3.5–5.1)
Sodium: 140 mmol/L (ref 135–145)
Total Bilirubin: 0.5 mg/dL (ref 0.3–1.2)
Total Protein: 7 g/dL (ref 6.5–8.1)

## 2019-03-14 LAB — MAGNESIUM: Magnesium: 1.5 mg/dL — ABNORMAL LOW (ref 1.7–2.4)

## 2019-03-14 LAB — TSH: TSH: 1.388 u[IU]/mL (ref 0.350–4.500)

## 2019-03-14 MED ORDER — SODIUM CHLORIDE 0.9 % IV SOLN: 2.0000 g | Freq: Once | INTRAVENOUS | Status: DC

## 2019-03-14 MED ORDER — SODIUM CHLORIDE 0.9 % IV SOLN
Freq: Once | INTRAVENOUS | Status: AC
  Administered 2019-03-14: 10:00:00 via INTRAVENOUS
  Filled 2019-03-14: qty 250

## 2019-03-14 MED ORDER — SODIUM CHLORIDE 0.9% FLUSH
10.0000 mL | Freq: Once | INTRAVENOUS | Status: AC
  Administered 2019-03-14: 08:00:00 10 mL via INTRAVENOUS
  Filled 2019-03-14: qty 10

## 2019-03-14 MED ORDER — HEPARIN SOD (PORK) LOCK FLUSH 100 UNIT/ML IV SOLN
500.0000 [IU] | Freq: Once | INTRAVENOUS | Status: AC
  Administered 2019-03-14: 500 [IU] via INTRAVENOUS
  Filled 2019-03-14: qty 5

## 2019-03-14 MED ORDER — SODIUM CHLORIDE 0.9 % IV SOLN
Freq: Every day | INTRAVENOUS | Status: DC | PRN
  Filled 2019-03-14: qty 10

## 2019-03-14 MED ORDER — SODIUM CHLORIDE 0.9% FLUSH
10.0000 mL | INTRAVENOUS | Status: DC | PRN
  Administered 2019-03-14: 09:00:00 10 mL via INTRAVENOUS
  Filled 2019-03-14: qty 10

## 2019-03-14 MED ORDER — SODIUM CHLORIDE 0.9 % IV SOLN
Freq: Once | INTRAVENOUS | Status: AC
  Administered 2019-03-14: 10:00:00 via INTRAVENOUS
  Filled 2019-03-14: qty 10

## 2019-03-14 NOTE — Progress Notes (Signed)
Magnesium: 1.5, Potassium: 3.2. MD, Dr. Tasia Catchings, notified. MD placing orders in supportive therapy plan for Magnesium Sulfate 2 g IV and Potassium Chloride 20 mEq IV once over 1 hour today, see MAR.

## 2019-03-20 ENCOUNTER — Other Ambulatory Visit: Payer: Self-pay

## 2019-03-21 ENCOUNTER — Inpatient Hospital Stay: Payer: BC Managed Care – PPO

## 2019-03-21 ENCOUNTER — Other Ambulatory Visit: Payer: Self-pay

## 2019-03-21 DIAGNOSIS — E876 Hypokalemia: Secondary | ICD-10-CM

## 2019-03-21 DIAGNOSIS — C3491 Malignant neoplasm of unspecified part of right bronchus or lung: Secondary | ICD-10-CM | POA: Diagnosis not present

## 2019-03-21 DIAGNOSIS — E860 Dehydration: Secondary | ICD-10-CM

## 2019-03-21 DIAGNOSIS — Z95828 Presence of other vascular implants and grafts: Secondary | ICD-10-CM

## 2019-03-21 LAB — MAGNESIUM: Magnesium: 1.6 mg/dL — ABNORMAL LOW (ref 1.7–2.4)

## 2019-03-21 LAB — COMPREHENSIVE METABOLIC PANEL
ALT: 29 U/L (ref 0–44)
AST: 39 U/L (ref 15–41)
Albumin: 3.9 g/dL (ref 3.5–5.0)
Alkaline Phosphatase: 79 U/L (ref 38–126)
Anion gap: 13 (ref 5–15)
BUN: 9 mg/dL (ref 6–20)
CO2: 24 mmol/L (ref 22–32)
Calcium: 9.1 mg/dL (ref 8.9–10.3)
Chloride: 103 mmol/L (ref 98–111)
Creatinine, Ser: 0.88 mg/dL (ref 0.61–1.24)
GFR calc Af Amer: >60 mL/min (ref >60)
GFR calc non Af Amer: >60 mL/min (ref >60)
Glucose, Bld: 102 mg/dL — ABNORMAL HIGH (ref 70–99)
Potassium: 3.1 mmol/L — ABNORMAL LOW (ref 3.5–5.1)
Sodium: 140 mmol/L (ref 135–145)
Total Bilirubin: 0.6 mg/dL (ref 0.3–1.2)
Total Protein: 7 g/dL (ref 6.5–8.1)

## 2019-03-21 MED ORDER — SODIUM CHLORIDE 0.9 % IV SOLN
Freq: Once | INTRAVENOUS | Status: AC
  Administered 2019-03-21: 10:00:00 via INTRAVENOUS
  Filled 2019-03-21: qty 250

## 2019-03-21 MED ORDER — SODIUM CHLORIDE 0.9% FLUSH
10.0000 mL | INTRAVENOUS | Status: DC | PRN
  Administered 2019-03-21: 10 mL via INTRAVENOUS
  Filled 2019-03-21: qty 10

## 2019-03-21 MED ORDER — SODIUM CHLORIDE 0.9 % IV SOLN
Freq: Every day | INTRAVENOUS | Status: DC | PRN
  Filled 2019-03-21: qty 10

## 2019-03-21 MED ORDER — HEPARIN SOD (PORK) LOCK FLUSH 100 UNIT/ML IV SOLN
500.0000 [IU] | Freq: Once | INTRAVENOUS | Status: AC
  Administered 2019-03-21: 500 [IU] via INTRAVENOUS
  Filled 2019-03-21: qty 5

## 2019-03-21 MED ORDER — SODIUM CHLORIDE 0.9 % IV SOLN
Freq: Once | INTRAVENOUS | Status: AC
  Administered 2019-03-21: 11:00:00 via INTRAVENOUS
  Filled 2019-03-21: qty 250

## 2019-03-21 NOTE — Progress Notes (Signed)
Potassium: 3.1, Magnesium: 1.6. MD, Dr. Tasia Catchings, notified and aware. Per MD order: release order from supportive therapy plan for 0.9% Sodium Chloride 250 ml with Potassium Chloride 20 mEq and Magnesium Sulfate 2 g infusion over 1 hour, and proceed with administering at this time. See MAR.

## 2019-03-24 ENCOUNTER — Other Ambulatory Visit: Payer: Self-pay | Admitting: Oncology

## 2019-03-31 ENCOUNTER — Other Ambulatory Visit: Payer: Self-pay

## 2019-03-31 ENCOUNTER — Ambulatory Visit
Admission: RE | Admit: 2019-03-31 | Discharge: 2019-03-31 | Disposition: A | Discharge status: Home / Self Care | Payer: BC Managed Care – PPO | Source: Ambulatory Visit | Attending: Oncology | Admitting: Oncology

## 2019-03-31 DIAGNOSIS — C3491 Malignant neoplasm of unspecified part of right bronchus or lung: Secondary | ICD-10-CM | POA: Diagnosis not present

## 2019-04-03 ENCOUNTER — Encounter: Payer: Self-pay | Admitting: Oncology

## 2019-04-03 ENCOUNTER — Other Ambulatory Visit: Payer: Self-pay

## 2019-04-03 NOTE — Progress Notes (Signed)
Patient stated that he had been doing well with no complaints. 

## 2019-04-04 ENCOUNTER — Inpatient Hospital Stay: Payer: BC Managed Care – PPO

## 2019-04-04 ENCOUNTER — Other Ambulatory Visit: Payer: Self-pay

## 2019-04-04 ENCOUNTER — Ambulatory Visit
Admission: RE | Admit: 2019-04-04 | Discharge: 2019-04-04 | Disposition: A | Discharge status: Home / Self Care | Payer: BC Managed Care – PPO | Source: Ambulatory Visit | Attending: Oncology | Admitting: Oncology

## 2019-04-04 ENCOUNTER — Encounter: Payer: Self-pay | Admitting: Diagnostic Radiology

## 2019-04-04 ENCOUNTER — Inpatient Hospital Stay (HOSPITAL_BASED_OUTPATIENT_CLINIC_OR_DEPARTMENT_OTHER): Payer: BC Managed Care – PPO | Admitting: Oncology

## 2019-04-04 VITALS — BP 144/85 | HR 102 | Temp 97.1°F | Resp 16 | Wt 145.0 lb

## 2019-04-04 DIAGNOSIS — Z452 Encounter for adjustment and management of vascular access device: Secondary | ICD-10-CM | POA: Diagnosis not present

## 2019-04-04 DIAGNOSIS — D696 Thrombocytopenia, unspecified: Secondary | ICD-10-CM

## 2019-04-04 DIAGNOSIS — Z95828 Presence of other vascular implants and grafts: Secondary | ICD-10-CM | POA: Insufficient documentation

## 2019-04-04 DIAGNOSIS — C3491 Malignant neoplasm of unspecified part of right bronchus or lung: Secondary | ICD-10-CM | POA: Diagnosis not present

## 2019-04-04 DIAGNOSIS — D500 Iron deficiency anemia secondary to blood loss (chronic): Secondary | ICD-10-CM

## 2019-04-04 DIAGNOSIS — E538 Deficiency of other specified B group vitamins: Secondary | ICD-10-CM | POA: Insufficient documentation

## 2019-04-04 DIAGNOSIS — F1010 Alcohol abuse, uncomplicated: Secondary | ICD-10-CM

## 2019-04-04 DIAGNOSIS — E876 Hypokalemia: Secondary | ICD-10-CM | POA: Insufficient documentation

## 2019-04-04 HISTORY — PX: IR CV LINE INJECTION: IMG2294

## 2019-04-04 LAB — TECHNOLOGIST SMEAR REVIEW: Plt Morphology: DECREASED

## 2019-04-04 LAB — VITAMIN B12: Vitamin B-12: 262 pg/mL (ref 180–914)

## 2019-04-04 LAB — CBC WITH DIFFERENTIAL/PLATELET
Abs Immature Granulocytes: 0.02 10*3/uL (ref 0.00–0.07)
Basophils Absolute: 0 10*3/uL (ref 0.0–0.1)
Basophils Relative: 0 %
Eosinophils Absolute: 0.1 10*3/uL (ref 0.0–0.5)
Eosinophils Relative: 2 %
HCT: 32.8 % — ABNORMAL LOW (ref 39.0–52.0)
Hemoglobin: 11.5 g/dL — ABNORMAL LOW (ref 13.0–17.0)
Immature Granulocytes: 0 %
Lymphocytes Relative: 18 %
Lymphs Abs: 0.9 10*3/uL (ref 0.7–4.0)
MCH: 41.2 pg — ABNORMAL HIGH (ref 26.0–34.0)
MCHC: 35.1 g/dL (ref 30.0–36.0)
MCV: 117.6 fL — ABNORMAL HIGH (ref 80.0–100.0)
Monocytes Absolute: 0.3 10*3/uL (ref 0.1–1.0)
Monocytes Relative: 7 %
Neutro Abs: 3.6 10*3/uL (ref 1.7–7.7)
Neutrophils Relative %: 73 %
Platelets: 66 10*3/uL — ABNORMAL LOW (ref 150–400)
RBC: 2.79 MIL/uL — ABNORMAL LOW (ref 4.22–5.81)
RDW: 17.2 % — ABNORMAL HIGH (ref 11.5–15.5)
WBC: 4.9 10*3/uL (ref 4.0–10.5)
nRBC: 1.2 % — ABNORMAL HIGH (ref 0.0–0.2)

## 2019-04-04 LAB — COMPREHENSIVE METABOLIC PANEL
ALT: 35 U/L (ref 0–44)
AST: 62 U/L — ABNORMAL HIGH (ref 15–41)
Albumin: 3.8 g/dL (ref 3.5–5.0)
Alkaline Phosphatase: 87 U/L (ref 38–126)
Anion gap: 14 (ref 5–15)
BUN: 10 mg/dL (ref 6–20)
CO2: 24 mmol/L (ref 22–32)
Calcium: 9.1 mg/dL (ref 8.9–10.3)
Chloride: 105 mmol/L (ref 98–111)
Creatinine, Ser: 0.82 mg/dL (ref 0.61–1.24)
GFR calc Af Amer: >60 mL/min (ref >60)
GFR calc non Af Amer: >60 mL/min (ref >60)
Glucose, Bld: 92 mg/dL (ref 70–99)
Potassium: 3.5 mmol/L (ref 3.5–5.1)
Sodium: 143 mmol/L (ref 135–145)
Total Bilirubin: 0.8 mg/dL (ref 0.3–1.2)
Total Protein: 7.5 g/dL (ref 6.5–8.1)

## 2019-04-04 LAB — FOLATE: Folate: 10.4 ng/mL (ref >5.9)

## 2019-04-04 LAB — MAGNESIUM: Magnesium: 1.7 mg/dL (ref 1.7–2.4)

## 2019-04-04 LAB — IMMATURE PLATELET FRACTION: Immature Platelet Fraction: 8.9 % — ABNORMAL HIGH (ref 1.2–8.6)

## 2019-04-04 LAB — LACTATE DEHYDROGENASE: LDH: 145 U/L (ref 98–192)

## 2019-04-04 LAB — PATHOLOGIST SMEAR REVIEW

## 2019-04-04 MED ORDER — SODIUM CHLORIDE 0.9% FLUSH
10.0000 mL | INTRAVENOUS | Status: AC | PRN
  Administered 2019-04-04: 10 mL via INTRAVENOUS
  Filled 2019-04-04: qty 10

## 2019-04-04 MED ORDER — HEPARIN SOD (PORK) LOCK FLUSH 100 UNIT/ML IV SOLN: 500.0000 [IU] | Freq: Once | INTRAVENOUS | Status: AC

## 2019-04-04 MED ORDER — POTASSIUM CHLORIDE CRYS ER 20 MEQ PO TBCR: 20.0000 meq | EXTENDED_RELEASE_TABLET | Freq: Two times a day (BID) | ORAL | 0 refills | Status: DC

## 2019-04-04 MED ORDER — VITAMIN B-12 1000 MCG PO TABS: 1000.0000 ug | ORAL_TABLET | Freq: Every day | ORAL | 1 refills | Status: DC

## 2019-04-04 MED ORDER — IODIXANOL 320 MG/ML IV SOLN
100.0000 mL | Freq: Once | INTRAVENOUS | Status: AC | PRN
  Administered 2019-04-04: 10 mL via INTRAVENOUS

## 2019-04-04 NOTE — Progress Notes (Signed)
Nurse could not get a blood return with the [facility] a cath today.

## 2019-04-04 NOTE — Progress Notes (Signed)
Hematology/Oncology Follow up note [facility] [phone] [phone]   Patient Care Team: Tracie Harrier, MD as PCP - General (Internal Medicine) [name], RN as Registered Nurse  REASON FOR VISIT:  Follow up for treatment of squamous lung cancer.   HISTORY OF PRESENTING ILLNESS:  # Dec 2019 Stage I squamous lung cancer #s/p Bronchoscopy on 06/25/2018. subcarina EBUS FNA was non diagnostic, hypocellular specimen.  # 08/13/2018 CT guided right upper lobe biopsy pathology showed dense fibrosis and mixed inflammatory cells with prominent polytypic plasma cells component. Focal benign bronchial wall and alveolar spaces. No malignancy was identified.   # 08/13/2018 underwent right thoracotomy and wedge resection of a right upper lobe mass.  Frozen section was consistent with an inflammatory process.  On the second postop day, preliminary pathology reports high-grade malignancy.  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  08/20/2018 Patient was therefore taken back to the OR and underwent take complete lobectomy  pT1b pN0 cM0 stage I squamous cell lung cancer.  Margin is negative.  Recommend observation.    # 12/03/2018 CT chest w contrast showed local recurrence.  12/10/2018 PET showed No evidence of distant metastatic disease # 12/26/2018 s/p bronchoscopy biopsy. Confirmed local recurrence of squamous cell lung cancer.   medi port placed by Dr.Oaks.  Cancer treatment Started concurrent chemoradiation on 01/16/2019 Carboplatin AUC of 2 and Taxol 45 mg/m2 weekly finished in 03/05/2019.   INTERVAL HISTORY Samuel Choi is a 53 y.o. male who has above history reviewed by me today presents for evaluation prior to immunotherapy for treatment of  recurrent  squamous cell lung cancer. Patient reports feeling well at baseline. Denies any nausea, vomiting, fever, chills, abdominal pain, neuropathy or palpitation.  Denies any dizziness or headache  today.  Denies easy bruising or acute bleeding. He finished concurrent chemoradiation 1 month ago. He admits to restarting drinking hard liquor daily, 2-3 shots. Takes potassium 40 mEq daily. Also Slow-Mag 1 tablet twice daily.     Review of Systems  Constitutional: Negative for appetite change, chills, fatigue, fever and unexpected weight change.  HENT:   Negative for hearing loss and voice change.   Eyes: Negative for eye problems and icterus.  Respiratory: Negative for chest tightness, cough and shortness of breath.   Cardiovascular: Negative for chest pain and leg swelling.  Gastrointestinal: Negative for abdominal distention, abdominal pain and diarrhea.  Endocrine: Negative for hot flashes.  Genitourinary: Negative for difficulty urinating, dysuria and frequency.   Musculoskeletal: Negative for arthralgias.  Skin: Negative for itching and rash.  Neurological: Negative for light-headedness and numbness.  Hematological: Negative for adenopathy. Does not bruise/bleed easily.  Psychiatric/Behavioral: Negative for confusion.    MEDICAL HISTORY:  Past Medical History:  Diagnosis Date  . Alcohol abuse    usually drinks 2-3 drinks per day  . Atherosclerosis 06/2018  . Chronic sinusitis   . Dehydration 02/07/2019  . Emphysema of lung ([place]) 06/2018   patient unaware of this.  . Hip fracture ([place]) 05/2018   no surgery  . History of kidney stones 05/2018   per xray, bilateral nephrolitiasis  . Hypertension   . Squamous cell carcinoma of lung, right ([place]) 06/2018    SURGICAL HISTORY: Past Surgical History:  Procedure Laterality Date  . BRAIN SURGERY  10/2017   nasal/sinus endoscopy. mass benign  . ELECTROMAGNETIC NAVIGATION BROCHOSCOPY Right 06/25/2018   Procedure: ELECTROMAGNETIC NAVIGATION BRONCHOSCOPY;  Surgeon: [name], MD;  Location: [facility];  Service: Cardiopulmonary;  Laterality: Right;  .  ENDOBRONCHIAL ULTRASOUND Right 06/25/2018   Procedure: ENDOBRONCHIAL  ULTRASOUND;  Surgeon: [name], MD;  Location: [facility];  Service: Cardiopulmonary;  Laterality: Right;  . ENDOBRONCHIAL ULTRASOUND Right 12/26/2018   Procedure: ENDOBRONCHIAL ULTRASOUND RIGHT;  Surgeon: [name], MD;  Location: [facility];  Service: Cardiopulmonary;  Laterality: Right;  . FLEXIBLE BRONCHOSCOPY N/A 08/20/2018   Procedure: FLEXIBLE BRONCHOSCOPY PREOP;  Surgeon: [name], MD;  Location: [facility] OR[facility];  Service: Thoracic;  Laterality: N/A;  . NASAL SINUS SURGERY  10/2017   At [facility], frontal sinusotomy, ethmoidectomy, resection anterior cranial fossa neoplasm, turbinate resection  . PORTACATH PLACEMENT Left 01/15/2019   Procedure: INSERTION PORT-A-CATH;  Surgeon: [name], MD;  Location: [facility] OR[facility];  Service: General;  Laterality: Left;  . THORACOTOMY Right 08/13/2018   Procedure: PREOP BROCHOSCOPY WITH RIGHT THORACOTOMY AND RUL RESECTION;  Surgeon: [name], MD;  Location: [facility] OR[facility];  Service: General;  Laterality: Right;  . THORACOTOMY Right 08/20/2018   Procedure: THORACOTOMY MAJOR RIGHT UPPER LOBE LOBECTOMY;  Surgeon: [name], MD;  Location: [facility] OR[facility];  Service: Thoracic;  Laterality: Right;  . TOE SURGERY Left    pin in left toe    SOCIAL HISTORY: Social History   Socioeconomic History  . Marital status: Married    Spouse name: lisa  . Number of children: Not on file  . Years of education: Not on file  . Highest education level: Not on file  Occupational History  . 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 Financial resource strain: Not on file  . Food insecurity    Worry: Not on file    Inability: Not on file  . Transportation needs    Medical: Not on file    Non-medical: Not on file  Tobacco Use  . Smoking status: Former Smoker    Packs/day: 0.50    Years: 15.00    Pack years: 7.50    Types: Cigarettes    Quit date: 08/2018    Years since quitting: 0.6  . Smokeless tobacco: Never Used  Substance and  Sexual Activity  . Alcohol use: Yes    Alcohol/week: 3.0 standard drinks    Types: 3 Cans of beer per week    Comment: usually 2 drinks per week, per patient  . Drug use: No  . Sexual activity: Not on file  Lifestyle  . Physical activity    Days per week: Not on file    Minutes per session: Not on file  . Stress: Not on file  Relationships  . Social Herbalist on phone: Not on file    Gets together: Not on file    Attends religious service: Not on file    Active member of club or organization: Not on file    Attends meetings of clubs or organizations: Not on file    Relationship status: Not on file  . Intimate partner violence    Fear of current or ex partner: Not on file    Emotionally abused: Not on file    Physically abused: Not on file    Forced sexual activity: Not on file  Other Topics Concern  . Not on file  Social History Narrative  . Not on file    FAMILY HISTORY: Family History  Problem Relation Age of Onset  . Breast cancer Mother   . Diabetes Mother   . Lung cancer Father   . Hypertension Father  ALLERGIES:  has No Known Allergies.  MEDICATIONS:  Current Outpatient Medications  Medication Sig Dispense Refill  . albuterol (VENTOLIN HFA) 108 (90 Base) MCG/ACT inhaler Inhale 2 puffs into the lungs every 6 (six) hours as needed for wheezing or shortness of breath. 8 g 3  . amLODipine (NORVASC) 10 MG tablet Take 10 mg by mouth daily.     . cloNIDine (CATAPRES) 0.1 MG tablet Take 0.1 mg by mouth daily.   5  . folic acid (V-R FOLIC ACID) 409 MCG tablet Take 1 tablet (400 mcg total) by mouth daily. 90 tablet 1  . hydrALAZINE (APRESOLINE) 100 MG tablet Take 50 mg by mouth 2 (two) times daily.     . hydrochlorothiazide (HYDRODIURIL) 12.5 MG tablet Take 12.5 mg by mouth daily.     Marland Kitchen lidocaine-prilocaine (EMLA) cream Apply to affected area once 30 g 3  . magnesium chloride (SLOW-MAG) 64 MG TBEC SR tablet Take 1 tablet (64 mg total) by mouth 2 (two)  times daily. 60 tablet 0  . methocarbamol (ROBAXIN) 500 MG tablet TAKE 1 TABLET (500 MG TOTAL) BY MOUTH AT BEDTIME. 30 tablet 0  . olmesartan (BENICAR) 40 MG tablet Take 40 mg by mouth every other day.     . ondansetron (ZOFRAN) 8 MG tablet Take 1 tablet (8 mg total) by mouth 2 (two) times daily as needed for refractory nausea / vomiting. Start on day 3 after chemo. 30 tablet 1  . oxyCODONE-acetaminophen (PERCOCET) 5-325 MG tablet Take 1-2 tablets by mouth every 4 (four) hours as needed for severe pain. 20 tablet 0  . prochlorperazine (COMPAZINE) 10 MG tablet Take 1 tablet (10 mg total) by mouth every 6 (six) hours as needed (Nausea or vomiting). 30 tablet 1  . potassium chloride SA (K-DUR) 20 MEQ tablet Take 1 tablet (20 mEq total) by mouth 2 (two) times daily. 30 tablet 0   No current facility-administered medications for this visit.    Facility-Administered Medications Ordered in Other Visits  Medication Dose Route Frequency Provider Last Rate Last Dose  . albuterol (PROVENTIL) (2.5 MG/3ML) 0.083% nebulizer solution 2.5 mg  2.5 mg Nebulization Once System, Provider Not In      . heparin lock flush 100 unit/mL  500 Units Intravenous Once Earlie Server, MD      . sodium chloride flush (NS) 0.9 % injection 10 mL  10 mL Intravenous PRN Earlie Server, MD   10 mL at 04/04/19 0903     PHYSICAL EXAMINATION: ECOG PERFORMANCE STATUS: 0 - Asymptomatic Vitals:   04/03/19 1519  BP: (!) 144/85  Pulse: (!) 102  Resp: 16  Temp: (!) 97.1 F (36.2 C)  SpO2: 98%   Filed Weights   04/03/19 1519  Weight: 145 lb (65.8 kg)    Physical Exam Constitutional:      General: He is not in acute distress.    Comments: Thin  HENT:     Head: Normocephalic and atraumatic.  Eyes:     General: No scleral icterus.    Pupils: Pupils are equal, round, and reactive to light.  Neck:     Musculoskeletal: Normal range of motion and neck supple.  Cardiovascular:     Rate and Rhythm: Normal rate and regular rhythm.      Heart sounds: Normal heart sounds.  Pulmonary:     Effort: Pulmonary effort is normal. No respiratory distress.     Comments: Wheezes has improved. Abdominal:     General: Bowel sounds are normal. There is  no distension.     Palpations: Abdomen is soft. There is no mass.     Tenderness: There is no abdominal tenderness.  Musculoskeletal: Normal range of motion.        General: No deformity.  Skin:    General: Skin is warm and dry.     Findings: No erythema or rash.  Neurological:     Mental Status: He is alert and oriented to person, place, and time.     Cranial Nerves: No cranial nerve deficit.     Coordination: Coordination normal.  Psychiatric:        Behavior: Behavior normal.        Thought Content: Thought content normal.      LABORATORY DATA:  I have reviewed the data as listed Lab Results  Component Value Date   WBC 4.9 04/04/2019   HGB 11.5 (L) 04/04/2019   HCT 32.8 (L) 04/04/2019   MCV 117.6 (H) 04/04/2019   PLT 66 (L) 04/04/2019   Recent Labs    03/14/19 0812 03/21/19 0920 04/04/19 0925  NA 140 140 143  K 3.2* 3.1* 3.5  CL 104 103 105  CO2 _0 GLUCOSE 95 102* 92  BUN _1 CREATININE 0.89 0.88 0.82  CALCIUM 9.2 9.1 9.1  GFRNONAA >60 >60 >60  GFRAA >60 >60 >60  PROT 7.0 7.0 7.5  ALBUMIN 3.8 3.9 3.8  AST 27 39 62*  ALT 23 29 35  ALKPHOS 73 79 87  BILITOT 0.5 0.6 0.8   Iron/TIBC/Ferritin/ %Sat No results found for: IRON, TIBC, FERRITIN, IRONPCTSAT   Pathology 12/26/2018 Right hilar adenopathy/mass-EUS guided FNA Positive for malignancy, non small cell carcinoma, morphologically similar to previous right upper lobe squamous cell carcinoma.   ASSESSMENT & PLAN:  1. Squamous cell carcinoma of lung, right ([place])   2. Port-A-Cath in place   3. Thrombocytopenia ([place])   4. Folate deficiency   5. Hypokalemia   6. B12 deficiency   7. Alcohol abuse    # Recurrent Squamous cell carcinoma of lung,  Finished concurrent chemoradiation.  Supposed to start immunotherapy maintenance today.  Hold treatments due to acute cytopenia.  #Thrombocytopenia, platelet count 66,000. This is a new problem for him.  ? alcohol use. Check smear, which showed polychromatophilic RBCs with basophillic stippling, rare tear drop cells and fragmented RBC noted. Will send slide for pathologist review.  Check LDH, haptoglobin.   # Port-A Cath, today there is no blood return from his Mediport.  We will send him for dye study.  #Hypokalemia, potassium 3.5 today.  Discussed with patient that I will suggest him to decrease potassium supplementation to 20 mEq daily.  Prescription sent to pharmacy. #Hypo-magnesium, continue Slow-Mag 1 tablet twice daily.  # Folate deficiency, continue folic acid.  Folate level will repeat today.  Was normal. #Worsening of macrocytosis, check vitamin B12 and folate level. # Vitamin B12 deficiency, recommend patient to start taking oral vitamin B12 supplementation.  # Alchohol cessation was discussed with patient.   Return of visit: 1 weeks.    Earlie Server, MD, PhD Hematology Oncology [facility] technician smear is a possible [facility] 04/04/2019

## 2019-04-05 LAB — HAPTOGLOBIN: Haptoglobin: 261 mg/dL (ref 29–370)

## 2019-04-09 ENCOUNTER — Other Ambulatory Visit: Payer: Self-pay | Admitting: *Deleted

## 2019-04-09 ENCOUNTER — Encounter: Payer: Self-pay | Admitting: Oncology

## 2019-04-09 ENCOUNTER — Other Ambulatory Visit: Payer: Self-pay

## 2019-04-09 ENCOUNTER — Telehealth: Payer: Self-pay

## 2019-04-09 ENCOUNTER — Ambulatory Visit
Admission: RE | Admit: 2019-04-09 | Discharge: 2019-04-09 | Disposition: A | Discharge status: Home / Self Care | Payer: BC Managed Care – PPO | Source: Ambulatory Visit | Attending: Radiation Oncology | Admitting: Radiation Oncology

## 2019-04-09 ENCOUNTER — Encounter: Payer: Self-pay | Admitting: Radiation Oncology

## 2019-04-09 VITALS — BP 138/89 | HR 109 | Temp 97.1°F | Resp 16 | Wt 146.3 lb

## 2019-04-09 DIAGNOSIS — C3491 Malignant neoplasm of unspecified part of right bronchus or lung: Secondary | ICD-10-CM

## 2019-04-09 DIAGNOSIS — Z923 Personal history of irradiation: Secondary | ICD-10-CM | POA: Diagnosis not present

## 2019-04-09 DIAGNOSIS — Z87891 Personal history of nicotine dependence: Secondary | ICD-10-CM | POA: Insufficient documentation

## 2019-04-09 DIAGNOSIS — C3411 Malignant neoplasm of upper lobe, right bronchus or lung: Secondary | ICD-10-CM | POA: Insufficient documentation

## 2019-04-09 NOTE — Telephone Encounter (Signed)
Pre-visit assessment call attempted prior to [place] appointment with Dr. Tasia Catchings on 04/10/2019. No answer / Voicemail box full - unable to leave a message

## 2019-04-09 NOTE — Progress Notes (Signed)
Pre-visit assessment completed prior to appointment with Dr. Tasia Catchings on 04/10/2019. Pt has not concerns at this time.

## 2019-04-09 NOTE — Progress Notes (Signed)
Radiation Oncology Follow up Note  Name: Samuel Choi   Date:   04/09/2019 MRN:  [id number] DOB: 1966/02/14    This 53 y.o. male presents to the clinic today for 1 month follow-up status radiation therapy for recurrent squamous cell carcinoma the right lung status post right upper lobectomy in February 2020  REFERRING PROVIDER: Tracie Harrier, MD  HPI: Patient is a 53 year old male now at 1 month having completed salvage radiation therapy to his chest after right upper lobectomy in February 2020 for squamous cell carcinoma.  Seen today in routine follow-up he is doing well.  He specifically denies cough hemoptysis chest tightness.  He is having no dysphasia.  He had a recent CT scan.  Showing right suprahilar lesion decreased slightly in size and interval post his prior PET CT with no progressive findings.  COMPLICATIONS OF TREATMENT: none  FOLLOW UP COMPLIANCE: keeps appointments   PHYSICAL EXAM:  BP 138/89 (BP Location: Left Arm, Patient Position: Sitting)   Pulse (!) 109   Temp (!) 97.1 F (36.2 C) (Tympanic)   Resp 16   Wt 146 lb 4.8 oz (66.4 kg)   BMI 19.30 kg/m  Well-developed well-nourished patient in NAD. HEENT reveals PERLA, EOMI, discs not visualized.  Oral cavity is clear. No oral mucosal lesions are identified. Neck is clear without evidence of cervical or supraclavicular adenopathy. Lungs are clear to A&P. Cardiac examination is essentially unremarkable with regular rate and rhythm without murmur rub or thrill. Abdomen is benign with no organomegaly or masses noted. Motor sensory and DTR levels are equal and symmetric in the upper and lower extremities. Cranial nerves II through XII are grossly intact. Proprioception is intact. No peripheral adenopathy or edema is identified. No motor or sensory levels are noted. Crude visual fields are within normal range.  RADIOLOGY RESULTS: CT scan reviewed  PLAN: Present time patient is doing well with asymptomatic after radiation  therapy for salvage.  I am pleased with his overall progress.  Of asked to see him back in 3 to 4 months for follow-up and obtain a CT scan of his chest prior to that visit.  Patient knows to call with any concerns at any time.  I would like to take this opportunity to thank you for allowing me to participate in the care of your patient.Noreene Filbert, MD

## 2019-04-10 ENCOUNTER — Inpatient Hospital Stay (HOSPITAL_BASED_OUTPATIENT_CLINIC_OR_DEPARTMENT_OTHER): Payer: BC Managed Care – PPO | Admitting: Oncology

## 2019-04-10 ENCOUNTER — Inpatient Hospital Stay: Payer: BC Managed Care – PPO | Attending: Oncology

## 2019-04-10 ENCOUNTER — Inpatient Hospital Stay: Payer: BC Managed Care – PPO

## 2019-04-10 ENCOUNTER — Other Ambulatory Visit: Payer: Self-pay

## 2019-04-10 VITALS — BP 174/91 | HR 97 | Temp 97.7°F | Wt 145.0 lb

## 2019-04-10 VITALS — BP 160/93 | HR 87 | Resp 18

## 2019-04-10 DIAGNOSIS — Z5112 Encounter for antineoplastic immunotherapy: Secondary | ICD-10-CM

## 2019-04-10 DIAGNOSIS — E860 Dehydration: Secondary | ICD-10-CM

## 2019-04-10 DIAGNOSIS — Z87891 Personal history of nicotine dependence: Secondary | ICD-10-CM | POA: Insufficient documentation

## 2019-04-10 DIAGNOSIS — D7589 Other specified diseases of blood and blood-forming organs: Secondary | ICD-10-CM | POA: Diagnosis not present

## 2019-04-10 DIAGNOSIS — C3491 Malignant neoplasm of unspecified part of right bronchus or lung: Secondary | ICD-10-CM | POA: Diagnosis not present

## 2019-04-10 DIAGNOSIS — D696 Thrombocytopenia, unspecified: Secondary | ICD-10-CM | POA: Diagnosis not present

## 2019-04-10 DIAGNOSIS — E538 Deficiency of other specified B group vitamins: Secondary | ICD-10-CM | POA: Insufficient documentation

## 2019-04-10 DIAGNOSIS — Z79899 Other long term (current) drug therapy: Secondary | ICD-10-CM | POA: Insufficient documentation

## 2019-04-10 DIAGNOSIS — E876 Hypokalemia: Secondary | ICD-10-CM | POA: Diagnosis not present

## 2019-04-10 DIAGNOSIS — Z95828 Presence of other vascular implants and grafts: Secondary | ICD-10-CM

## 2019-04-10 DIAGNOSIS — I10 Essential (primary) hypertension: Secondary | ICD-10-CM | POA: Insufficient documentation

## 2019-04-10 LAB — CBC WITH DIFFERENTIAL/PLATELET
Abs Immature Granulocytes: 0.01 10*3/uL (ref 0.00–0.07)
Basophils Absolute: 0 10*3/uL (ref 0.0–0.1)
Basophils Relative: 0 %
Eosinophils Absolute: 0.1 10*3/uL (ref 0.0–0.5)
Eosinophils Relative: 4 %
HCT: 29.5 % — ABNORMAL LOW (ref 39.0–52.0)
Hemoglobin: 10.6 g/dL — ABNORMAL LOW (ref 13.0–17.0)
Immature Granulocytes: 0 %
Lymphocytes Relative: 27 %
Lymphs Abs: 0.8 10*3/uL (ref 0.7–4.0)
MCH: 42.7 pg — ABNORMAL HIGH (ref 26.0–34.0)
MCHC: 35.9 g/dL (ref 30.0–36.0)
MCV: 119 fL — ABNORMAL HIGH (ref 80.0–100.0)
Monocytes Absolute: 0.4 10*3/uL (ref 0.1–1.0)
Monocytes Relative: 14 %
Neutro Abs: 1.7 10*3/uL (ref 1.7–7.7)
Neutrophils Relative %: 55 %
Platelets: 145 10*3/uL — ABNORMAL LOW (ref 150–400)
RBC: 2.48 MIL/uL — ABNORMAL LOW (ref 4.22–5.81)
RDW: 16.4 % — ABNORMAL HIGH (ref 11.5–15.5)
WBC: 3.1 10*3/uL — ABNORMAL LOW (ref 4.0–10.5)
nRBC: 1 % — ABNORMAL HIGH (ref 0.0–0.2)

## 2019-04-10 LAB — COMPREHENSIVE METABOLIC PANEL
ALT: 32 U/L (ref 0–44)
AST: 63 U/L — ABNORMAL HIGH (ref 15–41)
Albumin: 3.6 g/dL (ref 3.5–5.0)
Alkaline Phosphatase: 83 U/L (ref 38–126)
Anion gap: 10 (ref 5–15)
BUN: 7 mg/dL (ref 6–20)
CO2: 24 mmol/L (ref 22–32)
Calcium: 8.9 mg/dL (ref 8.9–10.3)
Chloride: 108 mmol/L (ref 98–111)
Creatinine, Ser: 0.76 mg/dL (ref 0.61–1.24)
GFR calc Af Amer: >60 mL/min (ref >60)
GFR calc non Af Amer: >60 mL/min (ref >60)
Glucose, Bld: 88 mg/dL (ref 70–99)
Potassium: 3.5 mmol/L (ref 3.5–5.1)
Sodium: 142 mmol/L (ref 135–145)
Total Bilirubin: 0.5 mg/dL (ref 0.3–1.2)
Total Protein: 6.9 g/dL (ref 6.5–8.1)

## 2019-04-10 MED ORDER — SODIUM CHLORIDE 0.9% FLUSH
10.0000 mL | Freq: Once | INTRAVENOUS | Status: AC
  Administered 2019-04-10: 10 mL via INTRAVENOUS
  Filled 2019-04-10: qty 10

## 2019-04-10 MED ORDER — CYANOCOBALAMIN 1000 MCG/ML IJ SOLN
1000.0000 ug | Freq: Once | INTRAMUSCULAR | Status: AC
  Administered 2019-04-10: 10:00:00 1000 ug via INTRAMUSCULAR
  Filled 2019-04-10: qty 1

## 2019-04-10 MED ORDER — SODIUM CHLORIDE 0.9 % IV SOLN
9.6000 mg/kg | Freq: Once | INTRAVENOUS | Status: AC
  Administered 2019-04-10: 620 mg via INTRAVENOUS
  Filled 2019-04-10: qty 10

## 2019-04-10 MED ORDER — SODIUM CHLORIDE 0.9 % IV SOLN
Freq: Once | INTRAVENOUS | Status: AC
  Administered 2019-04-10: 10:00:00 via INTRAVENOUS
  Filled 2019-04-10: qty 250

## 2019-04-10 MED ORDER — HEPARIN SOD (PORK) LOCK FLUSH 100 UNIT/ML IV SOLN
500.0000 [IU] | Freq: Once | INTRAVENOUS | Status: AC | PRN
  Administered 2019-04-10: 500 [IU]
  Filled 2019-04-10: qty 5

## 2019-04-11 ENCOUNTER — Ambulatory Visit: Payer: BC Managed Care – PPO

## 2019-04-11 ENCOUNTER — Other Ambulatory Visit: Payer: BC Managed Care – PPO

## 2019-04-11 ENCOUNTER — Ambulatory Visit: Payer: BC Managed Care – PPO | Admitting: Oncology

## 2019-04-11 NOTE — Progress Notes (Signed)
Hematology/Oncology Follow up note [facility] [phone] [phone]   Patient Care Team: Tracie Harrier, MD as PCP - General (Internal Medicine) [name], RN as Registered Nurse  REASON FOR VISIT:  Follow up for treatment of squamous lung cancer.   HISTORY OF PRESENTING ILLNESS:  # Dec 2019 Stage I squamous lung cancer #s/p Bronchoscopy on 06/25/2018. subcarina EBUS FNA was non diagnostic, hypocellular specimen.  # 08/13/2018 CT guided right upper lobe biopsy pathology showed dense fibrosis and mixed inflammatory cells with prominent polytypic plasma cells component. Focal benign bronchial wall and alveolar spaces. No malignancy was identified.   # 08/13/2018 underwent right thoracotomy and wedge resection of a right upper lobe mass.  Frozen section was consistent with an inflammatory process.  On the second postop day, preliminary pathology reports high-grade malignancy.  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  08/20/2018 Patient was therefore taken back to the OR and underwent take complete lobectomy  pT1b pN0 cM0 stage I squamous cell lung cancer.  Margin is negative.  Recommend observation.    # 12/03/2018 CT chest w contrast showed local recurrence.  12/10/2018 PET showed No evidence of distant metastatic disease # 12/26/2018 s/p bronchoscopy biopsy. Confirmed local recurrence of squamous cell lung cancer.   medi port placed by Dr.Oaks.  Cancer treatment Started concurrent chemoradiation on 01/16/2019 Carboplatin AUC of 2 and Taxol 45 mg/m2 weekly finished in 03/05/2019.   INTERVAL HISTORY Samuel Choi is a 53 y.o. male who has above history reviewed by me today presents for evaluation prior to immunotherapy for treatment of  recurrent  squamous cell lung cancer. Reports feeling well at baseline.  Has stopped drinking alcohol since last week.  Denies weight loss, fever, chills, fatigue, night sweats.  Takes potasium 29mq  daily  Takes slow mags 1 tab daily   Review of Systems  Constitutional: Negative for appetite change, chills, fatigue, fever and unexpected weight change.  HENT:   Negative for hearing loss and voice change.   Eyes: Negative for eye problems and icterus.  Respiratory: Negative for chest tightness, cough and shortness of breath.   Cardiovascular: Negative for chest pain and leg swelling.  Gastrointestinal: Negative for abdominal distention, abdominal pain and diarrhea.  Endocrine: Negative for hot flashes.  Genitourinary: Negative for difficulty urinating, dysuria and frequency.   Musculoskeletal: Negative for arthralgias.  Skin: Negative for itching and rash.  Neurological: Negative for light-headedness and numbness.  Hematological: Negative for adenopathy. Does not bruise/bleed easily.  Psychiatric/Behavioral: Negative for confusion.    MEDICAL HISTORY:  Past Medical History:  Diagnosis Date  . Alcohol abuse    usually drinks 2-3 drinks per day  . Atherosclerosis 06/2018  . Chronic sinusitis   . Dehydration 02/07/2019  . [place]   patient unaware of this.  . [place] 05[place]   no surgery  . History of kidney stones 05/2018   per xray, bilateral nephrolitiasis  . Hypertension   . Squamous [place] right [place] 06[place]    SURGICAL HISTORY: Past Surgical History:  Procedure Laterality Date  . BRAIN SURGERY  10/2017   nasal/sinus endoscopy. mass benign  . ELECTROMAGNETIC NAVIGATION BROCHOSCOPY Right 06/25/2018   Procedure: ELECTROMAGNETIC NAVIGATION BRONCHOSCOPY;  Surgeon: KFlora Lipps MD;  Location: [facility] ORS;  Service: Cardiopulmonary;  Laterality: Right;  . ENDOBRONCHIAL ULTRASOUND Right 06/25/2018   Procedure: ENDOBRONCHIAL ULTRASOUND;  Surgeon: KFlora Lipps MD;  Location: [facility] ORS;  Service: Cardiopulmonary;  Laterality: Right;  .  ENDOBRONCHIAL ULTRASOUND Right 12/26/2018   Procedure: ENDOBRONCHIAL ULTRASOUND RIGHT;   Surgeon: [name], MD;  Location: [facility] OR[facility];  Service: Cardiopulmonary;  Laterality: Right;  . FLEXIBLE BRONCHOSCOPY N/A 08/20/2018   Procedure: FLEXIBLE BRONCHOSCOPY PREOP;  Surgeon: [name], MD;  Location: [facility] ORS;  Service: Thoracic;  Laterality: N/A;  . IR CV LINE INJECTION  04/04/2019  . NASAL SINUS SURGERY  10/2017   At [facility], frontal sinusotomy, ethmoidectomy, resection anterior cranial fossa neoplasm, turbinate resection  . PORTACATH PLACEMENT Left 01/15/2019   Procedure: INSERTION PORT-A-CATH;  Surgeon: [name], MD;  Location: [facility] ORS;  Service: General;  Laterality: Left;  . THORACOTOMY Right 08/13/2018   Procedure: PREOP BROCHOSCOPY WITH RIGHT THORACOTOMY AND RUL RESECTION;  Surgeon: [name], MD;  Location: [facility] OR[facility];  Service: General;  Laterality: Right;  . THORACOTOMY Right 08/20/2018   Procedure: THORACOTOMY MAJOR RIGHT UPPER LOBE LOBECTOMY;  Surgeon: [name], MD;  Location: [facility] ORS;  Service: Thoracic;  Laterality: Right;  . TOE SURGERY Left    pin in left toe    SOCIAL HISTORY: Social History   Socioeconomic History  . Marital status: Married    Spouse name: lisa  . Number of children: Not on file  . Years of education: Not on file  . Highest education level: Not on file  Occupational History  . 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 Financial resource strain: Not on file  . Food insecurity    Worry: Not on file    Inability: Not on file  . Transportation needs    Medical: Not on file    Non-medical: Not on file  Tobacco Use  . Smoking status: Former Smoker    Packs/day: 0.50    Years: 15.00    Pack years: 7.50    Types: Cigarettes    Quit date: 08/2018    Years since quitting: 0.6  . Smokeless tobacco: Never Used  Substance and Sexual Activity  . Alcohol use: Yes    Alcohol/week: 3.0 standard drinks    Types: 3 Cans of beer per week    Comment: usually 2 drinks per week, per patient  .  Drug use: No  . Sexual activity: Not on file  Lifestyle  . Physical activity    Days per week: Not on file    Minutes per session: Not on file  . Stress: Not on file  Relationships  . Social Herbalist on phone: Not on file    Gets together: Not on file    Attends religious service: Not on file    Active member of club or organization: Not on file    Attends meetings of clubs or organizations: Not on file    Relationship status: Not on file  . Intimate partner violence    Fear of current or ex partner: Not on file    Emotionally abused: Not on file    Physically abused: Not on file    Forced sexual activity: Not on file  Other Topics Concern  . Not on file  Social History Narrative  . Not on file    FAMILY HISTORY: Family History  Problem Relation Age of Onset  . Breast cancer Mother   . Diabetes Mother   . Lung cancer Father   . Hypertension Father     ALLERGIES:  has No Known Allergies.  MEDICATIONS:  Current Outpatient Medications  Medication Sig Dispense Refill  .  albuterol (VENTOLIN HFA) 108 (90 Base) MCG/ACT inhaler Inhale 2 puffs into the lungs every 6 (six) hours as needed for wheezing or shortness of breath. 8 g 3  . amLODipine (NORVASC) 10 MG tablet Take 10 mg by mouth daily.     . cloNIDine (CATAPRES) 0.1 MG tablet Take 0.1 mg by mouth daily.   5  . folic acid (V-R FOLIC ACID) 277 MCG tablet Take 1 tablet (400 mcg total) by mouth daily. 90 tablet 1  . hydrALAZINE (APRESOLINE) 100 MG tablet Take 50 mg by mouth 2 (two) times daily.     . hydrochlorothiazide (HYDRODIURIL) 12.5 MG tablet Take 12.5 mg by mouth daily.     Marland Kitchen lidocaine-prilocaine (EMLA) cream Apply to affected area once 30 g 3  . magnesium chloride (SLOW-MAG) 64 MG TBEC SR tablet Take 1 tablet (64 mg total) by mouth 2 (two) times daily. 60 tablet 0  . methocarbamol (ROBAXIN) 500 MG tablet TAKE 1 TABLET (500 MG TOTAL) BY MOUTH AT BEDTIME. 30 tablet 0  . olmesartan (BENICAR) 40 MG tablet  Take 40 mg by mouth every other day.     . ondansetron (ZOFRAN) 8 MG tablet Take 1 tablet (8 mg total) by mouth 2 (two) times daily as needed for refractory nausea / vomiting. Start on day 3 after chemo. 30 tablet 1  . oxyCODONE-acetaminophen (PERCOCET) 5-325 MG tablet Take 1-2 tablets by mouth every 4 (four) hours as needed for severe pain. 20 tablet 0  . potassium chloride SA (K-DUR) 20 MEQ tablet Take 1 tablet (20 mEq total) by mouth 2 (two) times daily. 30 tablet 0  . prochlorperazine (COMPAZINE) 10 MG tablet Take 1 tablet (10 mg total) by mouth every 6 (six) hours as needed (Nausea or vomiting). 30 tablet 1  . vitamin B-12 (CYANOCOBALAMIN) 1000 MCG tablet Take 1 tablet (1,000 mcg total) by mouth daily. 90 tablet 1   No current facility-administered medications for this visit.    Facility-Administered Medications Ordered in Other Visits  Medication Dose Route Frequency Provider Last Rate Last Dose  . albuterol (PROVENTIL) (2.5 MG/3ML) 0.083% nebulizer solution 2.5 mg  2.5 mg Nebulization Once System, Provider Not In      . heparin lock flush 100 unit/mL  500 Units Intravenous Once Earlie Server, MD      . sodium chloride flush (NS) 0.9 % injection 10 mL  10 mL Intravenous PRN Earlie Server, MD   10 mL at 04/04/19 0903     PHYSICAL EXAMINATION: ECOG PERFORMANCE STATUS: 0 - Asymptomatic Vitals:   04/10/19 0901  BP: (!) 174/91  Pulse: 97  Temp: 97.7 F (36.5 C)  SpO2: 96%   Filed Weights   04/10/19 0901  Weight: 145 lb (65.8 kg)    Physical Exam Constitutional:      General: He is not in acute distress.    Comments: Thin  HENT:     Head: Normocephalic and atraumatic.  Eyes:     General: No scleral icterus.    Pupils: Pupils are equal, round, and reactive to light.  Neck:     Musculoskeletal: Normal range of motion and neck supple.  Cardiovascular:     Rate and Rhythm: Normal rate and regular rhythm.     Heart sounds: Normal heart sounds.  Pulmonary:     Effort: Pulmonary effort  is normal. No respiratory distress.     Breath sounds: No wheezing.     Comments: Wheezes has improved. Abdominal:     General: Bowel sounds  are normal. There is no distension.     Palpations: Abdomen is soft. There is no mass.     Tenderness: There is no abdominal tenderness.  Musculoskeletal: Normal range of motion.        General: No deformity.  Skin:    General: Skin is warm and dry.     Findings: No erythema or rash.  Neurological:     Mental Status: He is alert and oriented to person, place, and time.     Cranial Nerves: No cranial nerve deficit.     Coordination: Coordination normal.  Psychiatric:        Behavior: Behavior normal.        Thought Content: Thought content normal.      LABORATORY DATA:  I have reviewed the data as listed Lab Results  Component Value Date   WBC 3.1 (L) 04/10/2019   HGB 10.6 (L) 04/10/2019   HCT 29.5 (L) 04/10/2019   MCV 119.0 (H) 04/10/2019   PLT 145 (L) 04/10/2019   Recent Labs    03/21/19 0920 04/04/19 0925 04/10/19 0840  NA 140 143 142  K 3.1* 3.5 3.5  CL 103 105 108  CO2 _0 GLUCOSE 102* 92 88  BUN _1 CREATININE 0.88 0.82 0.76  CALCIUM 9.1 9.1 8.9  GFRNONAA >60 >60 >60  GFRAA >60 >60 >60  PROT 7.0 7.5 6.9  ALBUMIN 3.9 3.8 3.6  AST 39 62* 63*  ALT 29 35 32  ALKPHOS 79 87 83  BILITOT 0.6 0.8 0.5   Iron/TIBC/Ferritin/ %Sat No results found for: IRON, TIBC, FERRITIN, IRONPCTSAT   Pathology 12/26/2018 Right hilar adenopathy/mass-EUS guided FNA Positive for malignancy, non small cell carcinoma, morphologically similar to previous right upper lobe squamous cell carcinoma.   ASSESSMENT & PLAN:  1. Squamous cell carcinoma of lung, right ([place])   2. Port-A-Cath in place   3. Thrombocytopenia ([place])   4. Folate deficiency   5. Encounter for antineoplastic immunotherapy    # Recurrent Squamous cell carcinoma of lung,  Finished concurrent chemoradiation. Labs are reviewed and discussed with patient.  Acceptable to proceed cycle 1 Durvalumab.   #Thrombocytopenia, platelet count has improved. He has stopped ETOH since last visit.   #Hypokalemia, potassium 3.5 today. Continue  potassium supplementation to 20 mEq daily.  Prescription sent to pharmacy. #Hypo-magnesium, continue Slow-Mag 1 tablet twice daily.  # Folate deficiency, continue folic acid.  Folate level will repeat today.  Was normal. #Worsening of macrocytosis, check vitamin B12 and folate level. # Vitamin B12 deficiency, B12 level is 262. Continue oral vitamin B12 oral supplementation. Start B12 injection weekly.  .  # Alchohol cessation was discussed with patient.   Return of visit: 2 weeks.    Earlie Server, MD, PhD Hematology Oncology [facility] technician smear is a possible [facility] 04/11/2019

## 2019-04-15 ENCOUNTER — Other Ambulatory Visit: Payer: Self-pay

## 2019-04-16 ENCOUNTER — Inpatient Hospital Stay: Payer: BC Managed Care – PPO

## 2019-04-16 ENCOUNTER — Other Ambulatory Visit: Payer: Self-pay

## 2019-04-16 DIAGNOSIS — E860 Dehydration: Secondary | ICD-10-CM

## 2019-04-16 DIAGNOSIS — C3491 Malignant neoplasm of unspecified part of right bronchus or lung: Secondary | ICD-10-CM | POA: Diagnosis not present

## 2019-04-16 MED ORDER — CYANOCOBALAMIN 1000 MCG/ML IJ SOLN
1000.0000 ug | Freq: Once | INTRAMUSCULAR | Status: AC
  Administered 2019-04-16: 1000 ug via INTRAMUSCULAR

## 2019-04-23 ENCOUNTER — Other Ambulatory Visit: Payer: Self-pay

## 2019-04-23 NOTE — Progress Notes (Signed)
Patient pre screened for office appointment, no questions or concerns today. 

## 2019-04-24 ENCOUNTER — Other Ambulatory Visit: Payer: Self-pay

## 2019-04-24 ENCOUNTER — Inpatient Hospital Stay (HOSPITAL_BASED_OUTPATIENT_CLINIC_OR_DEPARTMENT_OTHER): Payer: BC Managed Care – PPO | Admitting: Oncology

## 2019-04-24 ENCOUNTER — Inpatient Hospital Stay: Payer: BC Managed Care – PPO

## 2019-04-24 ENCOUNTER — Encounter: Payer: Self-pay | Admitting: Oncology

## 2019-04-24 VITALS — BP 152/88 | HR 102 | Temp 97.7°F | Resp 18 | Wt 143.5 lb

## 2019-04-24 DIAGNOSIS — C3491 Malignant neoplasm of unspecified part of right bronchus or lung: Secondary | ICD-10-CM

## 2019-04-24 DIAGNOSIS — E860 Dehydration: Secondary | ICD-10-CM

## 2019-04-24 DIAGNOSIS — E876 Hypokalemia: Secondary | ICD-10-CM

## 2019-04-24 DIAGNOSIS — Z5112 Encounter for antineoplastic immunotherapy: Secondary | ICD-10-CM

## 2019-04-24 DIAGNOSIS — D696 Thrombocytopenia, unspecified: Secondary | ICD-10-CM

## 2019-04-24 DIAGNOSIS — R000 Tachycardia, unspecified: Secondary | ICD-10-CM | POA: Diagnosis not present

## 2019-04-24 DIAGNOSIS — Z95828 Presence of other vascular implants and grafts: Secondary | ICD-10-CM

## 2019-04-24 DIAGNOSIS — E538 Deficiency of other specified B group vitamins: Secondary | ICD-10-CM | POA: Diagnosis not present

## 2019-04-24 LAB — CBC WITH DIFFERENTIAL/PLATELET
Abs Immature Granulocytes: 0.06 10*3/uL (ref 0.00–0.07)
Basophils Absolute: 0 10*3/uL (ref 0.0–0.1)
Basophils Relative: 0 %
Eosinophils Absolute: 0.1 10*3/uL (ref 0.0–0.5)
Eosinophils Relative: 0 %
HCT: 37.7 % — ABNORMAL LOW (ref 39.0–52.0)
Hemoglobin: 13.6 g/dL (ref 13.0–17.0)
Immature Granulocytes: 1 %
Lymphocytes Relative: 9 %
Lymphs Abs: 1.1 10*3/uL (ref 0.7–4.0)
MCH: 42.6 pg — ABNORMAL HIGH (ref 26.0–34.0)
MCHC: 36.1 g/dL — ABNORMAL HIGH (ref 30.0–36.0)
MCV: 118.2 fL — ABNORMAL HIGH (ref 80.0–100.0)
Monocytes Absolute: 1.2 10*3/uL — ABNORMAL HIGH (ref 0.1–1.0)
Monocytes Relative: 11 %
Neutro Abs: 9.2 10*3/uL — ABNORMAL HIGH (ref 1.7–7.7)
Neutrophils Relative %: 79 %
Platelets: 176 10*3/uL (ref 150–400)
RBC: 3.19 MIL/uL — ABNORMAL LOW (ref 4.22–5.81)
RDW: 12 % (ref 11.5–15.5)
WBC: 11.6 10*3/uL — ABNORMAL HIGH (ref 4.0–10.5)
nRBC: 0 % (ref 0.0–0.2)

## 2019-04-24 LAB — COMPREHENSIVE METABOLIC PANEL
ALT: 43 U/L (ref 0–44)
AST: 50 U/L — ABNORMAL HIGH (ref 15–41)
Albumin: 4.1 g/dL (ref 3.5–5.0)
Alkaline Phosphatase: 82 U/L (ref 38–126)
Anion gap: 10 (ref 5–15)
BUN: 14 mg/dL (ref 6–20)
CO2: 25 mmol/L (ref 22–32)
Calcium: 10 mg/dL (ref 8.9–10.3)
Chloride: 103 mmol/L (ref 98–111)
Creatinine, Ser: 0.89 mg/dL (ref 0.61–1.24)
GFR calc Af Amer: >60 mL/min (ref >60)
GFR calc non Af Amer: >60 mL/min (ref >60)
Glucose, Bld: 120 mg/dL — ABNORMAL HIGH (ref 70–99)
Potassium: 4.1 mmol/L (ref 3.5–5.1)
Sodium: 138 mmol/L (ref 135–145)
Total Bilirubin: 0.7 mg/dL (ref 0.3–1.2)
Total Protein: 8 g/dL (ref 6.5–8.1)

## 2019-04-24 MED ORDER — HEPARIN SOD (PORK) LOCK FLUSH 100 UNIT/ML IV SOLN
500.0000 [IU] | Freq: Once | INTRAVENOUS | Status: AC
  Administered 2019-04-24: 500 [IU] via INTRAVENOUS
  Filled 2019-04-24: qty 5

## 2019-04-24 MED ORDER — CYANOCOBALAMIN 1000 MCG/ML IJ SOLN
1000.0000 ug | Freq: Once | INTRAMUSCULAR | Status: AC
  Administered 2019-04-24: 1000 ug via INTRAMUSCULAR
  Filled 2019-04-24: qty 1

## 2019-04-24 MED ORDER — SODIUM CHLORIDE 0.9 % IV SOLN
Freq: Once | INTRAVENOUS | Status: AC
  Administered 2019-04-24: 09:00:00 via INTRAVENOUS
  Filled 2019-04-24: qty 250

## 2019-04-24 MED ORDER — SODIUM CHLORIDE 0.9 % IV SOLN
620.0000 mg | Freq: Once | INTRAVENOUS | Status: AC
  Administered 2019-04-24: 620 mg via INTRAVENOUS
  Filled 2019-04-24: qty 10

## 2019-04-24 MED ORDER — SODIUM CHLORIDE 0.9% FLUSH
10.0000 mL | INTRAVENOUS | Status: DC | PRN
  Administered 2019-04-24: 09:00:00 10 mL via INTRAVENOUS
  Filled 2019-04-24: qty 10

## 2019-04-24 NOTE — Progress Notes (Signed)
Hematology/Oncology Follow up note [facility] [phone] [phone]) [phone]   Patient Care Team: Tracie Harrier, MD as PCP - General (Internal Medicine) [name], RN as Registered Nurse  REASON FOR VISIT:  Follow up for treatment of squamous lung cancer.   HISTORY OF PRESENTING ILLNESS:  # Dec 2019 Stage I squamous lung cancer #s/p Bronchoscopy on 06/25/2018. subcarina EBUS FNA was non diagnostic, hypocellular specimen.  # 08/13/2018 CT guided right upper lobe biopsy pathology showed dense fibrosis and mixed inflammatory cells with prominent polytypic plasma cells component. Focal benign bronchial wall and alveolar spaces. No malignancy was identified.   # 08/13/2018 underwent right thoracotomy and wedge resection of a right upper lobe mass.  Frozen section was consistent with an inflammatory process.  On the second postop day, preliminary pathology reports high-grade malignancy.  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  08/20/2018 Patient was therefore taken back to the OR and underwent take complete lobectomy  pT1b pN0 cM0 stage I squamous cell lung cancer.  Margin is negative.  Recommend observation.    # 12/03/2018 CT chest w contrast showed local recurrence.  12/10/2018 PET showed No evidence of distant metastatic disease # 12/26/2018 s/p bronchoscopy biopsy. Confirmed local recurrence of squamous cell lung cancer.   medi port placed by Dr.Oaks.  Cancer treatment Started concurrent chemoradiation on 01/16/2019 Carboplatin AUC of 2 and Taxol 45 mg/m2 weekly finished in 03/05/2019.   INTERVAL HISTORY Samuel Choi is a 53 y.o. male who has above history reviewed by me today presents for evaluation prior to immunotherapy for treatment of  recurrent  squamous cell lung cancer. Patient reports feeling well at baseline. Denies any weight loss, fever, chills, fatigue, night sweats. 2 weeks ago stopped the alcohol. Takes potassium 40 mEq  daily. Takes Slow-Mag 1 tablet daily. Heart rate in the clinic was high at 107.  Denies any palpitation, chest pain or shortness of breath Re measure of heart rate 102 during the clinic.  Review of Systems  Constitutional: Negative for appetite change, chills, fatigue, fever and unexpected weight change.  HENT:   Negative for hearing loss and voice change.   Eyes: Negative for eye problems and icterus.  Respiratory: Negative for chest tightness, cough and shortness of breath.   Cardiovascular: Negative for chest pain and leg swelling.  Gastrointestinal: Negative for abdominal distention, abdominal pain and diarrhea.  Endocrine: Negative for hot flashes.  Genitourinary: Negative for difficulty urinating, dysuria and frequency.   Musculoskeletal: Negative for arthralgias.  Skin: Negative for itching and rash.  Neurological: Negative for light-headedness and numbness.  Hematological: Negative for adenopathy. Does not bruise/bleed easily.  Psychiatric/Behavioral: Negative for confusion.    MEDICAL HISTORY:  Past Medical History:  Diagnosis Date  . Alcohol abuse    usually drinks 2-3 drinks per day  . Atherosclerosis 06/2018  . Chronic sinusitis   . Dehydration 02/07/2019  . Emphysema of lung ([place]) 06/2018   patient unaware of this.  . Hip fracture ([place]) 05/2018   no surgery  . History of kidney stones 05/2018   per xray, bilateral nephrolitiasis  . Hypertension   . Squamous cell carcinoma of lung, right ([place]) 06/2018    SURGICAL HISTORY: Past Surgical History:  Procedure Laterality Date  . BRAIN SURGERY  10/2017   nasal/sinus endoscopy. mass benign  . ELECTROMAGNETIC NAVIGATION BROCHOSCOPY Right 06/25/2018   Procedure: ELECTROMAGNETIC NAVIGATION BRONCHOSCOPY;  Surgeon: [name], MD;  Location: [facility] ORS;  Service: Cardiopulmonary;  Laterality: Right;  . ENDOBRONCHIAL  ULTRASOUND Right 06/25/2018   Procedure: ENDOBRONCHIAL ULTRASOUND;  Surgeon: [name], MD;  Location:  [facility] ORS;  Service: Cardiopulmonary;  Laterality: Right;  . ENDOBRONCHIAL ULTRASOUND Right 12/26/2018   Procedure: ENDOBRONCHIAL ULTRASOUND RIGHT;  Surgeon: [name], MD;  Location: [facility] ORS;  Service: Cardiopulmonary;  Laterality: Right;  . FLEXIBLE BRONCHOSCOPY N/A 08/20/2018   Procedure: FLEXIBLE BRONCHOSCOPY PREOP;  Surgeon: [name], MD;  Location: [facility] ORS;  Service: Thoracic;  Laterality: N/A;  . IR CV LINE INJECTION  04/04/2019  . NASAL SINUS SURGERY  10/2017   At [facility], frontal sinusotomy, ethmoidectomy, resection anterior cranial fossa neoplasm, turbinate resection  . PORTACATH PLACEMENT Left 01/15/2019   Procedure: INSERTION PORT-A-CATH;  Surgeon: [name], MD;  Location: [facility] ORS;  Service: General;  Laterality: Left;  . THORACOTOMY Right 08/13/2018   Procedure: PREOP BROCHOSCOPY WITH RIGHT THORACOTOMY AND RUL RESECTION;  Surgeon: [name], MD;  Location: [facility] ORS;  Service: General;  Laterality: Right;  . THORACOTOMY Right 08/20/2018   Procedure: THORACOTOMY MAJOR RIGHT UPPER LOBE LOBECTOMY;  Surgeon: [name], MD;  Location: [facility] ORS;  Service: Thoracic;  Laterality: Right;  . TOE SURGERY Left    pin in left toe    SOCIAL HISTORY: Social History   Socioeconomic History  . Marital status: Married    Spouse name: lisa  . Number of children: Not on file  . Years of education: Not on file  . Highest education level: Not on file  Occupational History  . 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 Financial resource strain: Not on file  . Food insecurity    Worry: Not on file    Inability: Not on file  . Transportation needs    Medical: Not on file    Non-medical: Not on file  Tobacco Use  . Smoking status: Former Smoker    Packs/day: 0.50    Years: 15.00    Pack years: 7.50    Types: Cigarettes    Quit date: 08/2018    Years since quitting: 0.7  . Smokeless tobacco: Never Used  Substance and Sexual Activity   . Alcohol use: Yes    Alcohol/week: 3.0 standard drinks    Types: 3 Cans of beer per week    Comment: usually 2 drinks per week, per patient  . Drug use: No  . Sexual activity: Not on file  Lifestyle  . Physical activity    Days per week: Not on file    Minutes per session: Not on file  . Stress: Not on file  Relationships  . Social Herbalist on phone: Not on file    Gets together: Not on file    Attends religious service: Not on file    Active member of club or organization: Not on file    Attends meetings of clubs or organizations: Not on file    Relationship status: Not on file  . Intimate partner violence    Fear of current or ex partner: Not on file    Emotionally abused: Not on file    Physically abused: Not on file    Forced sexual activity: Not on file  Other Topics Concern  . Not on file  Social History Narrative  . Not on file    FAMILY HISTORY: Family History  Problem Relation Age of Onset  . Breast cancer Mother   . Diabetes Mother   . Lung cancer Father   .  Hypertension Father     ALLERGIES:  has No Known Allergies.  MEDICATIONS:  Current Outpatient Medications  Medication Sig Dispense Refill  . albuterol (VENTOLIN HFA) 108 (90 Base) MCG/ACT inhaler Inhale 2 puffs into the lungs every 6 (six) hours as needed for wheezing or shortness of breath. 8 g 3  . amLODipine (NORVASC) 10 MG tablet Take 10 mg by mouth daily.     . cloNIDine (CATAPRES) 0.1 MG tablet Take 0.1 mg by mouth daily.   5  . folic acid (V-R FOLIC ACID) 426 MCG tablet Take 1 tablet (400 mcg total) by mouth daily. 90 tablet 1  . hydrALAZINE (APRESOLINE) 100 MG tablet Take 50 mg by mouth 2 (two) times daily.     . hydrochlorothiazide (HYDRODIURIL) 12.5 MG tablet Take 12.5 mg by mouth daily.     Marland Kitchen lidocaine-prilocaine (EMLA) cream Apply to affected area once 30 g 3  . magnesium chloride (SLOW-MAG) 64 MG TBEC SR tablet Take 1 tablet (64 mg total) by mouth 2 (two) times daily. 60  tablet 0  . methocarbamol (ROBAXIN) 500 MG tablet TAKE 1 TABLET (500 MG TOTAL) BY MOUTH AT BEDTIME. 30 tablet 0  . olmesartan (BENICAR) 40 MG tablet Take 40 mg by mouth every other day.     . ondansetron (ZOFRAN) 8 MG tablet Take 1 tablet (8 mg total) by mouth 2 (two) times daily as needed for refractory nausea / vomiting. Start on day 3 after chemo. 30 tablet 1  . oxyCODONE-acetaminophen (PERCOCET) 5-325 MG tablet Take 1-2 tablets by mouth every 4 (four) hours as needed for severe pain. 20 tablet 0  . potassium chloride SA (K-DUR) 20 MEQ tablet Take 1 tablet (20 mEq total) by mouth 2 (two) times daily. 30 tablet 0  . prochlorperazine (COMPAZINE) 10 MG tablet Take 1 tablet (10 mg total) by mouth every 6 (six) hours as needed (Nausea or vomiting). 30 tablet 1  . vitamin B-12 (CYANOCOBALAMIN) 1000 MCG tablet Take 1 tablet (1,000 mcg total) by mouth daily. 90 tablet 1   No current facility-administered medications for this visit.    Facility-Administered Medications Ordered in Other Visits  Medication Dose Route Frequency Provider Last Rate Last Dose  . albuterol (PROVENTIL) (2.5 MG/3ML) 0.083% nebulizer solution 2.5 mg  2.5 mg Nebulization Once System, Provider Not In      . heparin lock flush 100 unit/mL  500 Units Intravenous Once Earlie Server, MD      . sodium chloride flush (NS) 0.9 % injection 10 mL  10 mL Intravenous PRN Earlie Server, MD   10 mL at 04/04/19 0903     PHYSICAL EXAMINATION: ECOG PERFORMANCE STATUS: 0 - Asymptomatic Vitals:   04/24/19 0848  BP: (!) 152/88  Pulse: (!) 102  Resp: 18  Temp: 97.7 F (36.5 C)  SpO2: 98%   Filed Weights   04/24/19 0848  Weight: 143 lb 8 oz (65.1 kg)    Physical Exam Constitutional:      General: He is not in acute distress.    Comments: Thin  HENT:     Head: Normocephalic and atraumatic.  Eyes:     General: No scleral icterus.    Pupils: Pupils are equal, round, and reactive to light.  Neck:     Musculoskeletal: Normal range of motion  and neck supple.  Cardiovascular:     Rate and Rhythm: Regular rhythm. Tachycardia present.     Heart sounds: Normal heart sounds.  Pulmonary:     Effort: Pulmonary  effort is normal. No respiratory distress.     Breath sounds: No wheezing.  Abdominal:     General: Bowel sounds are normal. There is no distension.     Palpations: Abdomen is soft. There is no mass.     Tenderness: There is no abdominal tenderness.  Musculoskeletal: Normal range of motion.        General: No deformity.  Skin:    General: Skin is warm and dry.     Findings: No erythema or rash.  Neurological:     Mental Status: He is alert and oriented to person, place, and time.     Cranial Nerves: No cranial nerve deficit.     Coordination: Coordination normal.  Psychiatric:        Behavior: Behavior normal.        Thought Content: Thought content normal.      LABORATORY DATA:  I have reviewed the data as listed Lab Results  Component Value Date   WBC 11.6 (H) 04/24/2019   HGB 13.6 04/24/2019   HCT 37.7 (L) 04/24/2019   MCV 118.2 (H) 04/24/2019   PLT 176 04/24/2019   Recent Labs    04/04/19 0925 04/10/19 0840 04/24/19 0812  NA 143 142 138  K 3.5 3.5 4.1  CL 105 108 103  CO2 _0 GLUCOSE 92 88 120*  BUN _1 CREATININE 0.82 0.76 0.89  CALCIUM 9.1 8.9 10.0  GFRNONAA >60 >60 >60  GFRAA >60 >60 >60  PROT 7.5 6.9 8.0  ALBUMIN 3.8 3.6 4.1  AST 62* 63* 50*  ALT 35 32 43  ALKPHOS 87 83 82  BILITOT 0.8 0.5 0.7   Iron/TIBC/Ferritin/ %Sat No results found for: IRON, TIBC, FERRITIN, IRONPCTSAT   Pathology 12/26/2018 Right hilar adenopathy/mass-EUS guided FNA Positive for malignancy, non small cell carcinoma, morphologically similar to previous right upper lobe squamous cell carcinoma.   ASSESSMENT & PLAN:  1. Encounter for antineoplastic immunotherapy   2. Squamous cell carcinoma of lung, right ([place])   3. Folate deficiency   4. B12 deficiency   5. Tachycardia   6. Hypokalemia   7.  Hypomagnesemia   8. Alcohol abuse    # Recurrent Squamous cell carcinoma of lung,  Currently on maintenance immunotherapy treatments with durvalumab. Clinically doing well.  No significant immunotherapy toxicities. Labs are reviewed and discussed with patient. Counts acceptable to proceed with cycle 2 of durvalumab.  #Thrombocytopenia, platelet counts has normalized. #Hypokalemia, potassium level 4.1.  Stable.  Continue potassium oral supplementation. # Hypo-magnesium, continue Slow-Mag 1 tablet twice daily.  # Folate deficiency, continue folic acid.  Folate level will repeat today.  Was normal. #Worsening of macrocytosis, check vitamin B12 and folate level. #Folate Vitamin B12 deficiency, we will proceed with vitamin B12 IM injection x1 today.  Continue folic acid supplementation. #.  Tachycardia, asymptomatic.  New problem for him.  recommend patient improve oral hydration.  Check TSH  Return of visit: 2 weeks.    Earlie Server, MD, PhD Hematology Oncology [facility] technician smear is a possible  [facility] 04/24/2019

## 2019-04-24 NOTE — Progress Notes (Signed)
Per Benjamine Mola RN per Dr. Tasia Catchings okay to proceed with treatment (Imfinzi) and B12 injection with HR of 102.

## 2019-05-02 ENCOUNTER — Other Ambulatory Visit: Payer: Self-pay | Admitting: *Deleted

## 2019-05-02 MED ORDER — METHOCARBAMOL 500 MG PO TABS: 500.0000 mg | ORAL_TABLET | Freq: Every day | ORAL | 0 refills | Status: DC

## 2019-05-07 ENCOUNTER — Other Ambulatory Visit: Payer: Self-pay

## 2019-05-07 ENCOUNTER — Encounter: Payer: Self-pay | Admitting: Oncology

## 2019-05-07 NOTE — Progress Notes (Signed)
Patient stated that he had been doing well with no concerns.

## 2019-05-08 ENCOUNTER — Inpatient Hospital Stay: Payer: BC Managed Care – PPO

## 2019-05-08 ENCOUNTER — Other Ambulatory Visit: Payer: Self-pay

## 2019-05-08 ENCOUNTER — Inpatient Hospital Stay (HOSPITAL_BASED_OUTPATIENT_CLINIC_OR_DEPARTMENT_OTHER): Payer: BC Managed Care – PPO | Admitting: Oncology

## 2019-05-08 ENCOUNTER — Encounter: Payer: Self-pay | Admitting: Oncology

## 2019-05-08 VITALS — BP 126/84 | HR 109 | Temp 99.1°F | Resp 16 | Wt 147.3 lb

## 2019-05-08 VITALS — BP 130/84 | HR 90 | Resp 18

## 2019-05-08 DIAGNOSIS — R000 Tachycardia, unspecified: Secondary | ICD-10-CM

## 2019-05-08 DIAGNOSIS — C3491 Malignant neoplasm of unspecified part of right bronchus or lung: Secondary | ICD-10-CM

## 2019-05-08 DIAGNOSIS — E538 Deficiency of other specified B group vitamins: Secondary | ICD-10-CM

## 2019-05-08 DIAGNOSIS — Z5112 Encounter for antineoplastic immunotherapy: Secondary | ICD-10-CM

## 2019-05-08 DIAGNOSIS — Z95828 Presence of other vascular implants and grafts: Secondary | ICD-10-CM

## 2019-05-08 DIAGNOSIS — D696 Thrombocytopenia, unspecified: Secondary | ICD-10-CM

## 2019-05-08 LAB — COMPREHENSIVE METABOLIC PANEL
ALT: 32 U/L (ref 0–44)
AST: 39 U/L (ref 15–41)
Albumin: 3.8 g/dL (ref 3.5–5.0)
Alkaline Phosphatase: 70 U/L (ref 38–126)
Anion gap: 10 (ref 5–15)
BUN: 10 mg/dL (ref 6–20)
CO2: 23 mmol/L (ref 22–32)
Calcium: 9.2 mg/dL (ref 8.9–10.3)
Chloride: 110 mmol/L (ref 98–111)
Creatinine, Ser: 0.93 mg/dL (ref 0.61–1.24)
GFR calc Af Amer: >60 mL/min (ref >60)
GFR calc non Af Amer: >60 mL/min (ref >60)
Glucose, Bld: 120 mg/dL — ABNORMAL HIGH (ref 70–99)
Potassium: 4.1 mmol/L (ref 3.5–5.1)
Sodium: 143 mmol/L (ref 135–145)
Total Bilirubin: 0.4 mg/dL (ref 0.3–1.2)
Total Protein: 7 g/dL (ref 6.5–8.1)

## 2019-05-08 LAB — CBC WITH DIFFERENTIAL/PLATELET
Abs Immature Granulocytes: 0.04 10*3/uL (ref 0.00–0.07)
Basophils Absolute: 0.1 10*3/uL (ref 0.0–0.1)
Basophils Relative: 1 %
Eosinophils Absolute: 0.2 10*3/uL (ref 0.0–0.5)
Eosinophils Relative: 2 %
HCT: 35.8 % — ABNORMAL LOW (ref 39.0–52.0)
Hemoglobin: 12.9 g/dL — ABNORMAL LOW (ref 13.0–17.0)
Immature Granulocytes: 0 %
Lymphocytes Relative: 10 %
Lymphs Abs: 0.9 10*3/uL (ref 0.7–4.0)
MCH: 41.6 pg — ABNORMAL HIGH (ref 26.0–34.0)
MCHC: 36 g/dL (ref 30.0–36.0)
MCV: 115.5 fL — ABNORMAL HIGH (ref 80.0–100.0)
Monocytes Absolute: 0.7 10*3/uL (ref 0.1–1.0)
Monocytes Relative: 7 %
Neutro Abs: 7.5 10*3/uL (ref 1.7–7.7)
Neutrophils Relative %: 80 %
Platelets: 170 10*3/uL (ref 150–400)
RBC: 3.1 MIL/uL — ABNORMAL LOW (ref 4.22–5.81)
RDW: 11.8 % (ref 11.5–15.5)
WBC: 9.4 10*3/uL (ref 4.0–10.5)
nRBC: 0 % (ref 0.0–0.2)

## 2019-05-08 LAB — TSH: TSH: 1.163 u[IU]/mL (ref 0.350–4.500)

## 2019-05-08 MED ORDER — SODIUM CHLORIDE 0.9 % IV SOLN
Freq: Once | INTRAVENOUS | Status: AC
  Administered 2019-05-08: 10:00:00 via INTRAVENOUS
  Filled 2019-05-08: qty 250

## 2019-05-08 MED ORDER — SODIUM CHLORIDE 0.9 % IV SOLN
620.0000 mg | Freq: Once | INTRAVENOUS | Status: AC
  Administered 2019-05-08: 620 mg via INTRAVENOUS
  Filled 2019-05-08: qty 10

## 2019-05-08 MED ORDER — HEPARIN SOD (PORK) LOCK FLUSH 100 UNIT/ML IV SOLN
500.0000 [IU] | Freq: Once | INTRAVENOUS | Status: AC | PRN
  Administered 2019-05-08: 500 [IU]

## 2019-05-10 NOTE — Progress Notes (Signed)
Hematology/Oncology Follow up note [facility] [phone]) [phone] [phone]) [phone]   Patient Care Team: Tracie Harrier, MD as PCP - General (Internal Medicine) [name], RN as Registered Nurse  REASON FOR VISIT:  Follow up for treatment of squamous lung cancer.   HISTORY OF PRESENTING ILLNESS:  # Dec 2019 Stage I squamous lung cancer #s/p Bronchoscopy on 06/25/2018. subcarina EBUS FNA was non diagnostic, hypocellular specimen.  # 08/13/2018 CT guided right upper lobe biopsy pathology showed dense fibrosis and mixed inflammatory cells with prominent polytypic plasma cells component. Focal benign bronchial wall and alveolar spaces. No malignancy was identified.   # 08/13/2018 underwent right thoracotomy and wedge resection of a right upper lobe mass.  Frozen section was consistent with an inflammatory process.  On the second postop day, preliminary pathology reports high-grade malignancy.  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  08/20/2018 Patient was therefore taken back to the OR and underwent take complete lobectomy  pT1b pN0 cM0 stage I squamous cell lung cancer.  Margin is negative.  Recommend observation.    # 12/03/2018 CT chest w contrast showed local recurrence.  12/10/2018 PET showed No evidence of distant metastatic disease # 12/26/2018 s/p bronchoscopy biopsy. Confirmed local recurrence of squamous cell lung cancer.   medi port placed by Dr.Oaks.  Cancer treatment Started concurrent chemoradiation on 01/16/2019 Carboplatin AUC of 2 and Taxol 45 mg/m2 weekly finished in 03/05/2019.   INTERVAL HISTORY Samuel Choi is a 53 y.o. male who has above history reviewed by me today presents for evaluation prior to immunotherapy for treatment of  recurrent  squamous cell lung cancer. Patient reports feeling well today. Denies any new complaints.   No constitutional symptoms. He is now stopped alcohol again. Takes potassium 40 mg daily for  chronic hypokalemia  Patient also takes Flomax.    Review of Systems  Constitutional: Negative for appetite change, chills, fatigue, fever and unexpected weight change.  HENT:   Negative for hearing loss and voice change.   Eyes: Negative for eye problems and icterus.  Respiratory: Negative for chest tightness, cough and shortness of breath.   Cardiovascular: Negative for chest pain and leg swelling.  Gastrointestinal: Negative for abdominal distention, abdominal pain and diarrhea.  Endocrine: Negative for hot flashes.  Genitourinary: Negative for difficulty urinating, dysuria and frequency.   Musculoskeletal: Negative for arthralgias.  Skin: Negative for itching and rash.  Neurological: Negative for light-headedness and numbness.  Hematological: Negative for adenopathy. Does not bruise/bleed easily.  Psychiatric/Behavioral: Negative for confusion.    MEDICAL HISTORY:  Past Medical History:  Diagnosis Date  . Alcohol abuse    usually drinks 2-3 drinks per day  . Atherosclerosis 06/2018  . Chronic sinusitis   . Dehydration 02/07/2019  . Emphysema of lung ([place]) 06/2018   patient unaware of this.  . Hip fracture ([place]) 05/2018   no surgery  . History of kidney stones 05/2018   per xray, bilateral nephrolitiasis  . Hypertension   . Squamous cell carcinoma of lung, right ([place]) 06/2018    SURGICAL HISTORY: Past Surgical History:  Procedure Laterality Date  . BRAIN SURGERY  10/2017   nasal/sinus endoscopy. mass benign  . ELECTROMAGNETIC NAVIGATION BROCHOSCOPY Right 06/25/2018   Procedure: ELECTROMAGNETIC NAVIGATION BRONCHOSCOPY;  Surgeon: [name], MD;  Location: [facility] ORS;  Service: Cardiopulmonary;  Laterality: Right;  . ENDOBRONCHIAL ULTRASOUND Right 06/25/2018   Procedure: ENDOBRONCHIAL ULTRASOUND;  Surgeon: [name], MD;  Location: [facility] OR[facility];  Service: Cardiopulmonary;  Laterality: Right;  .  ENDOBRONCHIAL ULTRASOUND Right 12/26/2018   Procedure: ENDOBRONCHIAL  ULTRASOUND RIGHT;  Surgeon: [name], MD;  Location: [facility] OR[facility];  Service: Cardiopulmonary;  Laterality: Right;  . FLEXIBLE BRONCHOSCOPY N/A 08/20/2018   Procedure: FLEXIBLE BRONCHOSCOPY PREOP;  Surgeon: [name], MD;  Location: [facility] ORS;  Service: Thoracic;  Laterality: N/A;  . IR CV LINE INJECTION  04/04/2019  . NASAL SINUS SURGERY  10/2017   At [facility], frontal sinusotomy, ethmoidectomy, resection anterior cranial fossa neoplasm, turbinate resection  . PORTACATH PLACEMENT Left 01/15/2019   Procedure: INSERTION PORT-A-CATH;  Surgeon: [name], MD;  Location: [facility] ORS;  Service: General;  Laterality: Left;  . THORACOTOMY Right 08/13/2018   Procedure: PREOP BROCHOSCOPY WITH RIGHT THORACOTOMY AND RUL RESECTION;  Surgeon: [name], MD;  Location: [facility] OR[facility];  Service: General;  Laterality: Right;  . THORACOTOMY Right 08/20/2018   Procedure: THORACOTOMY MAJOR RIGHT UPPER LOBE LOBECTOMY;  Surgeon: [name], MD;  Location: [facility] OR[facility];  Service: Thoracic;  Laterality: Right;  . TOE SURGERY Left    pin in left toe    SOCIAL HISTORY: Social History   Socioeconomic History  . Marital status: Married    Spouse name: lisa  . Number of children: Not on file  . Years of education: Not on file  . Highest education level: Not on file  Occupational History  . 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 Financial resource strain: Not on file  . Food insecurity    Worry: Not on file    Inability: Not on file  . Transportation needs    Medical: Not on file    Non-medical: Not on file  Tobacco Use  . Smoking status: Former Smoker    Packs/day: 0.50    Years: 15.00    Pack years: 7.50    Types: Cigarettes    Quit date: 08/2018    Years since quitting: 0.7  . Smokeless tobacco: Never Used  Substance and Sexual Activity  . Alcohol use: Yes    Alcohol/week: 3.0 standard drinks    Types: 3 Cans of beer per week    Comment: usually 2 drinks per  week, per patient  . Drug use: No  . Sexual activity: Not on file  Lifestyle  . Physical activity    Days per week: Not on file    Minutes per session: Not on file  . Stress: Not on file  Relationships  . Social Herbalist on phone: Not on file    Gets together: Not on file    Attends religious service: Not on file    Active member of club or organization: Not on file    Attends meetings of clubs or organizations: Not on file    Relationship status: Not on file  . Intimate partner violence    Fear of current or ex partner: Not on file    Emotionally abused: Not on file    Physically abused: Not on file    Forced sexual activity: Not on file  Other Topics Concern  . Not on file  Social History Narrative  . Not on file    FAMILY HISTORY: Family History  Problem Relation Age of Onset  . Breast cancer Mother   . Diabetes Mother   . Lung cancer Father   . Hypertension Father     ALLERGIES:  has No Known Allergies.  MEDICATIONS:  Current Outpatient Medications  Medication Sig Dispense Refill  .  albuterol (VENTOLIN HFA) 108 (90 Base) MCG/ACT inhaler Inhale 2 puffs into the lungs every 6 (six) hours as needed for wheezing or shortness of breath. 8 g 3  . amLODipine (NORVASC) 10 MG tablet Take 10 mg by mouth daily.     . cloNIDine (CATAPRES) 0.1 MG tablet Take 0.1 mg by mouth daily.   5  . folic acid (V-R FOLIC ACID) 628 MCG tablet Take 1 tablet (400 mcg total) by mouth daily. 90 tablet 1  . hydrALAZINE (APRESOLINE) 100 MG tablet Take 50 mg by mouth 2 (two) times daily.     . hydrochlorothiazide (HYDRODIURIL) 12.5 MG tablet Take 12.5 mg by mouth daily.     Marland Kitchen lidocaine-prilocaine (EMLA) cream Apply to affected area once 30 g 3  . magnesium chloride (SLOW-MAG) 64 MG TBEC SR tablet Take 1 tablet (64 mg total) by mouth 2 (two) times daily. 60 tablet 0  . olmesartan (BENICAR) 40 MG tablet Take 40 mg by mouth every other day.     . ondansetron (ZOFRAN) 8 MG tablet Take 1  tablet (8 mg total) by mouth 2 (two) times daily as needed for refractory nausea / vomiting. Start on day 3 after chemo. 30 tablet 1  . potassium chloride SA (K-DUR) 20 MEQ tablet Take 1 tablet (20 mEq total) by mouth 2 (two) times daily. 30 tablet 0  . prochlorperazine (COMPAZINE) 10 MG tablet Take 1 tablet (10 mg total) by mouth every 6 (six) hours as needed (Nausea or vomiting). 30 tablet 1  . vitamin B-12 (CYANOCOBALAMIN) 1000 MCG tablet Take 1 tablet (1,000 mcg total) by mouth daily. 90 tablet 1  . methocarbamol (ROBAXIN) 500 MG tablet Take 1 tablet (500 mg total) by mouth at bedtime. (Patient not taking: Reported on 05/07/2019) 30 tablet 0  . oxyCODONE-acetaminophen (PERCOCET) 5-325 MG tablet Take 1-2 tablets by mouth every 4 (four) hours as needed for severe pain. (Patient not taking: Reported on 05/07/2019) 20 tablet 0   No current facility-administered medications for this visit.    Facility-Administered Medications Ordered in Other Visits  Medication Dose Route Frequency Provider Last Rate Last Dose  . albuterol (PROVENTIL) (2.5 MG/3ML) 0.083% nebulizer solution 2.5 mg  2.5 mg Nebulization Once System, Provider Not In      . heparin lock flush 100 unit/mL  500 Units Intravenous Once Earlie Server, MD      . sodium chloride flush (NS) 0.9 % injection 10 mL  10 mL Intravenous PRN Earlie Server, MD   10 mL at 04/04/19 0903     PHYSICAL EXAMINATION: ECOG PERFORMANCE STATUS: 0 - Asymptomatic Vitals:   05/08/19 0917 05/08/19 0934  BP: (!) 153/102 126/84  Pulse: (!) 109   Resp: 16   Temp: 99.1 F (37.3 C)   SpO2: 99%    Filed Weights   05/08/19 0917  Weight: 147 lb 4.8 oz (66.8 kg)    Physical Exam Constitutional:      General: He is not in acute distress.    Comments: Thin  HENT:     Head: Normocephalic and atraumatic.  Eyes:     General: No scleral icterus.    Pupils: Pupils are equal, round, and reactive to light.  Neck:     Musculoskeletal: Normal range of motion and neck  supple.  Cardiovascular:     Rate and Rhythm: Regular rhythm. Tachycardia present.     Heart sounds: Normal heart sounds.  Pulmonary:     Effort: Pulmonary effort is normal. No respiratory distress.  Breath sounds: No wheezing.  Abdominal:     General: Bowel sounds are normal. There is no distension.     Palpations: Abdomen is soft. There is no mass.     Tenderness: There is no abdominal tenderness.  Musculoskeletal: Normal range of motion.        General: No deformity.  Skin:    General: Skin is warm and dry.     Findings: No erythema or rash.  Neurological:     Mental Status: He is alert and oriented to person, place, and time.     Cranial Nerves: No cranial nerve deficit.     Coordination: Coordination normal.  Psychiatric:        Behavior: Behavior normal.        Thought Content: Thought content normal.      LABORATORY DATA:  I have reviewed the data as listed Lab Results  Component Value Date   WBC 9.4 05/08/2019   HGB 12.9 (L) 05/08/2019   HCT 35.8 (L) 05/08/2019   MCV 115.5 (H) 05/08/2019   PLT 170 05/08/2019   Recent Labs    04/10/19 0840 04/24/19 0812 05/08/19 0901  NA 142 138 143  K 3.5 4.1 4.1  CL 108 103 110  CO2 _0 GLUCOSE 88 120* 120*  BUN _1 CREATININE 0.76 0.89 0.93  CALCIUM 8.9 10.0 9.2  GFRNONAA >60 >60 >60  GFRAA >60 >60 >60  PROT 6.9 8.0 7.0  ALBUMIN 3.6 4.1 3.8  AST 63* 50* 39  ALT 32 43 32  ALKPHOS 83 82 70  BILITOT 0.5 0.7 0.4   Iron/TIBC/Ferritin/ %Sat No results found for: IRON, TIBC, FERRITIN, IRONPCTSAT   Pathology 12/26/2018 Right hilar adenopathy/mass-EUS guided FNA Positive for malignancy, non small cell carcinoma, morphologically similar to previous right upper lobe squamous cell carcinoma.   ASSESSMENT & PLAN:  1. B12 deficiency   2. Squamous cell carcinoma of lung, right ([place])   3. Encounter for antineoplastic immunotherapy   4. Folate deficiency   5. Tachycardia    # Recurrent Squamous cell  carcinoma of lung,  .  Currently on maintenance immunotherapy.  Tolerating well.  No immunotherapy toxicities so far. Labs are reviewed and discussed with patient. Proceed with today's maintenance durvalumab treatments. Marland Kitchen  #Anemia, hemoglobin 12.9.  Improved from 4 weeks ago. #Transaminitis has resolved since he stopped alcohol. #Folate deficiency, continue folic acid. #Vitamin B12 deficiency,Continue oral vitamin B12 supplementation..  Proceed with vitamin B12 injections every 2 weeks. #Tachycardia, unknown etiology.  He is asymptomatic.  Will refer to cardiology for further evaluation.   Return of visit: 2 weeks.    Earlie Server, MD, PhD Hematology Oncology [facility] technician smear is a possible [facility] 05/10/2019

## 2019-05-21 ENCOUNTER — Encounter: Payer: Self-pay | Admitting: Oncology

## 2019-05-21 ENCOUNTER — Other Ambulatory Visit: Payer: Self-pay

## 2019-05-21 NOTE — Progress Notes (Signed)
Patient stated that he had been doing well with no complaints. 

## 2019-05-22 ENCOUNTER — Inpatient Hospital Stay: Payer: BC Managed Care – PPO | Attending: Oncology

## 2019-05-22 ENCOUNTER — Encounter: Payer: Self-pay | Admitting: Oncology

## 2019-05-22 ENCOUNTER — Inpatient Hospital Stay (HOSPITAL_BASED_OUTPATIENT_CLINIC_OR_DEPARTMENT_OTHER): Payer: BC Managed Care – PPO | Admitting: Oncology

## 2019-05-22 ENCOUNTER — Other Ambulatory Visit: Payer: Self-pay

## 2019-05-22 ENCOUNTER — Inpatient Hospital Stay: Payer: BC Managed Care – PPO

## 2019-05-22 VITALS — BP 137/88 | HR 111 | Temp 97.1°F | Resp 18 | Wt 145.5 lb

## 2019-05-22 VITALS — BP 111/78 | HR 96 | Temp 98.3°F | Resp 18

## 2019-05-22 DIAGNOSIS — Z79899 Other long term (current) drug therapy: Secondary | ICD-10-CM | POA: Insufficient documentation

## 2019-05-22 DIAGNOSIS — E876 Hypokalemia: Secondary | ICD-10-CM | POA: Diagnosis not present

## 2019-05-22 DIAGNOSIS — Z803 Family history of malignant neoplasm of breast: Secondary | ICD-10-CM | POA: Diagnosis not present

## 2019-05-22 DIAGNOSIS — R000 Tachycardia, unspecified: Secondary | ICD-10-CM

## 2019-05-22 DIAGNOSIS — C3411 Malignant neoplasm of upper lobe, right bronchus or lung: Secondary | ICD-10-CM | POA: Diagnosis not present

## 2019-05-22 DIAGNOSIS — E538 Deficiency of other specified B group vitamins: Secondary | ICD-10-CM | POA: Diagnosis not present

## 2019-05-22 DIAGNOSIS — Z95828 Presence of other vascular implants and grafts: Secondary | ICD-10-CM

## 2019-05-22 DIAGNOSIS — D696 Thrombocytopenia, unspecified: Secondary | ICD-10-CM | POA: Insufficient documentation

## 2019-05-22 DIAGNOSIS — C3491 Malignant neoplasm of unspecified part of right bronchus or lung: Secondary | ICD-10-CM

## 2019-05-22 DIAGNOSIS — Z5112 Encounter for antineoplastic immunotherapy: Secondary | ICD-10-CM | POA: Insufficient documentation

## 2019-05-22 DIAGNOSIS — I10 Essential (primary) hypertension: Secondary | ICD-10-CM | POA: Insufficient documentation

## 2019-05-22 DIAGNOSIS — E860 Dehydration: Secondary | ICD-10-CM

## 2019-05-22 DIAGNOSIS — Z87891 Personal history of nicotine dependence: Secondary | ICD-10-CM | POA: Insufficient documentation

## 2019-05-22 DIAGNOSIS — D539 Nutritional anemia, unspecified: Secondary | ICD-10-CM

## 2019-05-22 DIAGNOSIS — Z801 Family history of malignant neoplasm of trachea, bronchus and lung: Secondary | ICD-10-CM | POA: Insufficient documentation

## 2019-05-22 DIAGNOSIS — J439 Emphysema, unspecified: Secondary | ICD-10-CM | POA: Insufficient documentation

## 2019-05-22 DIAGNOSIS — Z87442 Personal history of urinary calculi: Secondary | ICD-10-CM | POA: Insufficient documentation

## 2019-05-22 DIAGNOSIS — Z833 Family history of diabetes mellitus: Secondary | ICD-10-CM | POA: Insufficient documentation

## 2019-05-22 DIAGNOSIS — Z8249 Family history of ischemic heart disease and other diseases of the circulatory system: Secondary | ICD-10-CM | POA: Insufficient documentation

## 2019-05-22 LAB — CBC WITH DIFFERENTIAL/PLATELET
Abs Immature Granulocytes: 0.03 10*3/uL (ref 0.00–0.07)
Basophils Absolute: 0 10*3/uL (ref 0.0–0.1)
Basophils Relative: 1 %
Eosinophils Absolute: 0.1 10*3/uL (ref 0.0–0.5)
Eosinophils Relative: 2 %
HCT: 38 % — ABNORMAL LOW (ref 39.0–52.0)
Hemoglobin: 13.8 g/dL (ref 13.0–17.0)
Immature Granulocytes: 0 %
Lymphocytes Relative: 10 %
Lymphs Abs: 0.9 10*3/uL (ref 0.7–4.0)
MCH: 40.6 pg — ABNORMAL HIGH (ref 26.0–34.0)
MCHC: 36.3 g/dL — ABNORMAL HIGH (ref 30.0–36.0)
MCV: 111.8 fL — ABNORMAL HIGH (ref 80.0–100.0)
Monocytes Absolute: 0.8 10*3/uL (ref 0.1–1.0)
Monocytes Relative: 8 %
Neutro Abs: 7.1 10*3/uL (ref 1.7–7.7)
Neutrophils Relative %: 79 %
Platelets: 135 10*3/uL — ABNORMAL LOW (ref 150–400)
RBC: 3.4 MIL/uL — ABNORMAL LOW (ref 4.22–5.81)
RDW: 11.9 % (ref 11.5–15.5)
WBC: 8.9 10*3/uL (ref 4.0–10.5)
nRBC: 0 % (ref 0.0–0.2)

## 2019-05-22 LAB — COMPREHENSIVE METABOLIC PANEL
ALT: 32 U/L (ref 0–44)
AST: 67 U/L — ABNORMAL HIGH (ref 15–41)
Albumin: 4.1 g/dL (ref 3.5–5.0)
Alkaline Phosphatase: 77 U/L (ref 38–126)
Anion gap: 11 (ref 5–15)
BUN: 11 mg/dL (ref 6–20)
CO2: 24 mmol/L (ref 22–32)
Calcium: 9.4 mg/dL (ref 8.9–10.3)
Chloride: 104 mmol/L (ref 98–111)
Creatinine, Ser: 0.98 mg/dL (ref 0.61–1.24)
GFR calc Af Amer: >60 mL/min (ref >60)
GFR calc non Af Amer: >60 mL/min (ref >60)
Glucose, Bld: 129 mg/dL — ABNORMAL HIGH (ref 70–99)
Potassium: 3.6 mmol/L (ref 3.5–5.1)
Sodium: 139 mmol/L (ref 135–145)
Total Bilirubin: 0.9 mg/dL (ref 0.3–1.2)
Total Protein: 7.6 g/dL (ref 6.5–8.1)

## 2019-05-22 LAB — VITAMIN B12: Vitamin B-12: 696 pg/mL (ref 180–914)

## 2019-05-22 MED ORDER — CYANOCOBALAMIN 1000 MCG/ML IJ SOLN
1000.0000 ug | Freq: Once | INTRAMUSCULAR | Status: AC
  Administered 2019-05-22: 1000 ug via INTRAMUSCULAR
  Filled 2019-05-22: qty 1

## 2019-05-22 MED ORDER — HEPARIN SOD (PORK) LOCK FLUSH 100 UNIT/ML IV SOLN
500.0000 [IU] | Freq: Once | INTRAVENOUS | Status: AC
  Administered 2019-05-22: 500 [IU] via INTRAVENOUS

## 2019-05-22 MED ORDER — SODIUM CHLORIDE 0.9% FLUSH
10.0000 mL | Freq: Once | INTRAVENOUS | Status: AC
  Administered 2019-05-22: 10 mL via INTRAVENOUS
  Filled 2019-05-22: qty 10

## 2019-05-22 MED ORDER — SODIUM CHLORIDE 0.9 % IV SOLN
620.0000 mg | Freq: Once | INTRAVENOUS | Status: AC
  Administered 2019-05-22: 620 mg via INTRAVENOUS
  Filled 2019-05-22: qty 2.4

## 2019-05-22 MED ORDER — SODIUM CHLORIDE 0.9 % IV SOLN
Freq: Once | INTRAVENOUS | Status: AC
  Administered 2019-05-22: 10:00:00 via INTRAVENOUS
  Filled 2019-05-22: qty 250

## 2019-05-22 NOTE — Progress Notes (Signed)
Pre-assessment done the day before MD visit.

## 2019-05-22 NOTE — Progress Notes (Signed)
Hematology/Oncology Follow up note [facility] [phone]) [phone] [phone]   Patient Care Team: Tracie Harrier, MD as PCP - General (Internal Medicine) [name], RN as Registered Nurse  REASON FOR VISIT:  Follow up for treatment of squamous lung cancer.   HISTORY OF PRESENTING ILLNESS:  # Dec 2019 Stage I squamous lung cancer #s/p Bronchoscopy on 06/25/2018. subcarina EBUS FNA was non diagnostic, hypocellular specimen.  # 08/13/2018 CT guided right upper lobe biopsy pathology showed dense fibrosis and mixed inflammatory cells with prominent polytypic plasma cells component. Focal benign bronchial wall and alveolar spaces. No malignancy was identified.   # 08/13/2018 underwent right thoracotomy and wedge resection of a right upper lobe mass.  Frozen section was consistent with an inflammatory process.  On the second postop day, preliminary pathology reports high-grade malignancy.  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  08/20/2018 Patient was therefore taken back to the OR and underwent take complete lobectomy  pT1b pN0 cM0 stage I squamous cell lung cancer.  Margin is negative.  Recommend observation.    # 12/03/2018 CT chest w contrast showed local recurrence.  12/10/2018 PET showed No evidence of distant metastatic disease # 12/26/2018 s/p bronchoscopy biopsy. Confirmed local recurrence of squamous cell lung cancer.   medi port placed by Dr.Oaks.  Cancer treatment Started concurrent chemoradiation on 01/16/2019 Carboplatin AUC of 2 and Taxol 45 mg/m2 weekly finished in 03/05/2019.   INTERVAL HISTORY Samuel Choi is a 53 y.o. male who has above history reviewed by me today presents for evaluation prior to immunotherapy for treatment of  recurrent  squamous cell lung cancer. Patient continues to do well.  Currently on immunotherapy maintenance with durvalumab. He reports tolerating well.  Today he has no new complaints. Per patient,  he has stopped alcohol. Takes potassium 40 mEq daily for chronic hypokalemia. Patient has been chronically tachycardic the past few clinic visits.  He denies any palpitation, chest pain, shortness of breath more than his usual baseline.  Denies any baseline cardiac problems.  Review of Systems  Constitutional: Negative for appetite change, chills, fatigue, fever and unexpected weight change.  HENT:   Negative for hearing loss and voice change.   Eyes: Negative for eye problems and icterus.  Respiratory: Negative for chest tightness, cough and shortness of breath.   Cardiovascular: Negative for chest pain and leg swelling.  Gastrointestinal: Negative for abdominal distention, abdominal pain and diarrhea.  Endocrine: Negative for hot flashes.  Genitourinary: Negative for difficulty urinating, dysuria and frequency.   Musculoskeletal: Negative for arthralgias.  Skin: Negative for itching and rash.  Neurological: Negative for light-headedness and numbness.  Hematological: Negative for adenopathy. Does not bruise/bleed easily.  Psychiatric/Behavioral: Negative for confusion.    MEDICAL HISTORY:  Past Medical History:  Diagnosis Date  . Alcohol abuse    usually drinks 2-3 drinks per day  . Atherosclerosis 06/2018  . Chronic sinusitis   . Dehydration 02/07/2019  . Emphysema of lung ([place]) 06/2018   patient unaware of this.  . Hip fracture ([place]) 05/2018   no surgery  . History of kidney stones 05/2018   per xray, bilateral nephrolitiasis  . Hypertension   . Squamous cell carcinoma of lung, right ([place]) 06/2018    SURGICAL HISTORY: Past Surgical History:  Procedure Laterality Date  . BRAIN SURGERY  10/2017   nasal/sinus endoscopy. mass benign  . ELECTROMAGNETIC NAVIGATION BROCHOSCOPY Right 06/25/2018   Procedure: ELECTROMAGNETIC NAVIGATION BRONCHOSCOPY;  Surgeon: [name], MD;  Location: [facility] ORS;  Service: Cardiopulmonary;  Laterality: Right;  . ENDOBRONCHIAL ULTRASOUND Right  06/25/2018   Procedure: ENDOBRONCHIAL ULTRASOUND;  Surgeon: [name], MD;  Location: [facility];  Service: Cardiopulmonary;  Laterality: Right;  . ENDOBRONCHIAL ULTRASOUND Right 12/26/2018   Procedure: ENDOBRONCHIAL ULTRASOUND RIGHT;  Surgeon: [name], MD;  Location: [facility] OR[facility];  Service: Cardiopulmonary;  Laterality: Right;  . FLEXIBLE BRONCHOSCOPY N/A 08/20/2018   Procedure: FLEXIBLE BRONCHOSCOPY PREOP;  Surgeon: [name], MD;  Location: [facility] OR[facility];  Service: Thoracic;  Laterality: N/A;  . IR CV LINE INJECTION  04/04/2019  . NASAL SINUS SURGERY  10/2017   At [facility], frontal sinusotomy, ethmoidectomy, resection anterior cranial fossa neoplasm, turbinate resection  . PORTACATH PLACEMENT Left 01/15/2019   Procedure: INSERTION PORT-A-CATH;  Surgeon: [name], MD;  Location: [facility] OR[facility];  Service: General;  Laterality: Left;  . THORACOTOMY Right 08/13/2018   Procedure: PREOP BROCHOSCOPY WITH RIGHT THORACOTOMY AND RUL RESECTION;  Surgeon: [name], MD;  Location: [facility];  Service: General;  Laterality: Right;  . THORACOTOMY Right 08/20/2018   Procedure: THORACOTOMY MAJOR RIGHT UPPER LOBE LOBECTOMY;  Surgeon: [name], MD;  Location: [facility];  Service: Thoracic;  Laterality: Right;  . TOE SURGERY Left    pin in left toe    SOCIAL HISTORY: Social History   Socioeconomic History  . Marital status: Married    Spouse name: lisa  . Number of children: Not on file  . Years of education: Not on file  . Highest education level: Not on file  Occupational History  . 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 Financial resource strain: Not on file  . Food insecurity    Worry: Not on file    Inability: Not on file  . Transportation needs    Medical: Not on file    Non-medical: Not on file  Tobacco Use  . Smoking status: Former Smoker    Packs/day: 0.50    Years: 15.00    Pack years: 7.50    Types: Cigarettes    Quit date: 08/2018     Years since quitting: 0.7  . Smokeless tobacco: Never Used  Substance and Sexual Activity  . Alcohol use: Yes    Alcohol/week: 3.0 standard drinks    Types: 3 Cans of beer per week    Comment: usually 2 drinks per week, per patient  . Drug use: No  . Sexual activity: Not on file  Lifestyle  . Physical activity    Days per week: Not on file    Minutes per session: Not on file  . Stress: Not on file  Relationships  . Social Herbalist on phone: Not on file    Gets together: Not on file    Attends religious service: Not on file    Active member of club or organization: Not on file    Attends meetings of clubs or organizations: Not on file    Relationship status: Not on file  . Intimate partner violence    Fear of current or ex partner: Not on file    Emotionally abused: Not on file    Physically abused: Not on file    Forced sexual activity: Not on file  Other Topics Concern  . Not on file  Social History Narrative  . Not on file    FAMILY HISTORY: Family History  Problem Relation Age of Onset  . Breast cancer Mother   . Diabetes Mother   .  Lung cancer Father   . Hypertension Father     ALLERGIES:  has No Known Allergies.  MEDICATIONS:  Current Outpatient Medications  Medication Sig Dispense Refill  . albuterol (VENTOLIN HFA) 108 (90 Base) MCG/ACT inhaler Inhale 2 puffs into the lungs every 6 (six) hours as needed for wheezing or shortness of breath. 8 g 3  . amLODipine (NORVASC) 10 MG tablet Take 10 mg by mouth daily.     . cloNIDine (CATAPRES) 0.1 MG tablet Take 0.1 mg by mouth daily.   5  . folic acid (V-R FOLIC ACID) 330 MCG tablet Take 1 tablet (400 mcg total) by mouth daily. 90 tablet 1  . hydrALAZINE (APRESOLINE) 100 MG tablet Take 50 mg by mouth 2 (two) times daily.     . hydrochlorothiazide (HYDRODIURIL) 12.5 MG tablet Take 12.5 mg by mouth daily.     Marland Kitchen lidocaine-prilocaine (EMLA) cream Apply to affected area once 30 g 3  . magnesium chloride  (SLOW-MAG) 64 MG TBEC SR tablet Take 1 tablet (64 mg total) by mouth 2 (two) times daily. 60 tablet 0  . methocarbamol (ROBAXIN) 500 MG tablet Take 1 tablet (500 mg total) by mouth at bedtime. 30 tablet 0  . olmesartan (BENICAR) 40 MG tablet Take 40 mg by mouth every other day.     . ondansetron (ZOFRAN) 8 MG tablet Take 1 tablet (8 mg total) by mouth 2 (two) times daily as needed for refractory nausea / vomiting. Start on day 3 after chemo. 30 tablet 1  . prochlorperazine (COMPAZINE) 10 MG tablet Take 1 tablet (10 mg total) by mouth every 6 (six) hours as needed (Nausea or vomiting). 30 tablet 1  . vitamin B-12 (CYANOCOBALAMIN) 1000 MCG tablet Take 1 tablet (1,000 mcg total) by mouth daily. 90 tablet 1  . oxyCODONE-acetaminophen (PERCOCET) 5-325 MG tablet Take 1-2 tablets by mouth every 4 (four) hours as needed for severe pain. (Patient not taking: Reported on 05/21/2019) 20 tablet 0   No current facility-administered medications for this visit.    Facility-Administered Medications Ordered in Other Visits  Medication Dose Route Frequency Provider Last Rate Last Dose  . albuterol (PROVENTIL) (2.5 MG/3ML) 0.083% nebulizer solution 2.5 mg  2.5 mg Nebulization Once System, Provider Not In      . heparin lock flush 100 unit/mL  500 Units Intravenous Once Earlie Server, MD      . sodium chloride flush (NS) 0.9 % injection 10 mL  10 mL Intravenous PRN Earlie Server, MD   10 mL at 04/04/19 0903     PHYSICAL EXAMINATION: ECOG PERFORMANCE STATUS: 0 - Asymptomatic Vitals:   05/21/19 1010  BP: 137/88  Pulse: (!) 111  Resp: 18  Temp: (!) 97.1 F (36.2 C)   Filed Weights   05/21/19 1010  Weight: 145 lb 8 oz (66 kg)    Physical Exam Constitutional:      General: He is not in acute distress.    Comments: Thin  HENT:     Head: Normocephalic and atraumatic.  Eyes:     General: No scleral icterus.    Pupils: Pupils are equal, round, and reactive to light.  Neck:     Musculoskeletal: Normal range of  motion and neck supple.  Cardiovascular:     Rate and Rhythm: Regular rhythm. Tachycardia present.     Heart sounds: Normal heart sounds.  Pulmonary:     Effort: Pulmonary effort is normal. No respiratory distress.     Breath sounds: No wheezing.  Abdominal:     General: Bowel sounds are normal. There is no distension.     Palpations: Abdomen is soft. There is no mass.     Tenderness: There is no abdominal tenderness.  Musculoskeletal: Normal range of motion.        General: No deformity.  Skin:    General: Skin is warm and dry.     Findings: No erythema or rash.  Neurological:     Mental Status: He is alert and oriented to person, place, and time.     Cranial Nerves: No cranial nerve deficit.     Coordination: Coordination normal.  Psychiatric:        Behavior: Behavior normal.        Thought Content: Thought content normal.      LABORATORY DATA:  I have reviewed the data as listed Lab Results  Component Value Date   WBC 8.9 05/22/2019   HGB 13.8 05/22/2019   HCT 38.0 (L) 05/22/2019   MCV 111.8 (H) 05/22/2019   PLT 135 (L) 05/22/2019   Recent Labs    04/24/19 0812 05/08/19 0901 05/22/19 0911  NA 138 143 139  K 4.1 4.1 3.6  CL 103 110 104  CO2 _0 GLUCOSE 120* 120* 129*  BUN _1 CREATININE 0.89 0.93 0.98  CALCIUM 10.0 9.2 9.4  GFRNONAA >60 >60 >60  GFRAA >60 >60 >60  PROT 8.0 7.0 7.6  ALBUMIN 4.1 3.8 4.1  AST 50* 39 67*  ALT 43 32 32  ALKPHOS 82 70 77  BILITOT 0.7 0.4 0.9   Iron/TIBC/Ferritin/ %Sat No results found for: IRON, TIBC, FERRITIN, IRONPCTSAT   Pathology 12/26/2018 Right hilar adenopathy/mass-EUS guided FNA Positive for malignancy, non small cell carcinoma, morphologically similar to previous right upper lobe squamous cell carcinoma.   ASSESSMENT & PLAN:  1. Squamous cell carcinoma of lung, right ([place])   2. Encounter for antineoplastic immunotherapy   3. B12 deficiency   4. Thrombocytopenia ([place])   5. Folate deficiency   6.  Tachycardia   7. Macrocytic anemia    # Recurrent Squamous cell carcinoma of lung,  Patient tolerates immunotherapy maintenance well.  No significant signs of immunotherapy related toxicities. Labs reviewed and discussed with patient. His counts acceptable to proceed with today's maintenance durvalumab treatments. TSH has been monitored and has been within normal.  Limits We will repeat CT chest in December 2020.  #Macrocytic anemia, secondary to vitamin B12 deficiency.  Patient has been taking oral iron and B12 dilatation.  He also gets vitamin B12 injection boost every 2 weeks lately. Labs are reviewed.  Macrocytosis is getting better.  Anemia has improved.  We will proceed with another dose of vitamin B12 IM today.  Check vitamin B12 at next visit.  #Thrombocytopenia, platelet count 1 35,000.  Continue to close monitor. #Tachycardia, unknown etiology.  Questionable due to alcohol cessation.  Denies any baseline cardiac problems.  He is asymptomatic.  TSH has been checked limits.  Will refer to Dr. Ubaldo Glassing for further evaluation.  #Folate deficiency, continue folic acid. Return of visit: 2 weeks.    Earlie Server, MD, PhD Hematology Oncology [facility] technician smear is a possible [facility] 05/22/2019

## 2019-05-29 ENCOUNTER — Other Ambulatory Visit: Payer: Self-pay | Admitting: Oncology

## 2019-06-09 ENCOUNTER — Other Ambulatory Visit: Payer: Self-pay | Admitting: Oncology

## 2019-06-10 ENCOUNTER — Other Ambulatory Visit: Payer: Self-pay | Admitting: Oncology

## 2019-06-10 MED ORDER — ALBUTEROL SULFATE HFA 108 (90 BASE) MCG/ACT IN AERS: 2.0000 | INHALATION_SPRAY | Freq: Four times a day (QID) | RESPIRATORY_TRACT | 6 refills | Status: DC | PRN

## 2019-06-11 ENCOUNTER — Other Ambulatory Visit: Payer: Self-pay

## 2019-06-11 NOTE — Progress Notes (Signed)
Patient pre screened for office appointment, no questions or concerns today. Patient reminded of upcoming appointment time and date. 

## 2019-06-12 ENCOUNTER — Other Ambulatory Visit: Payer: Self-pay

## 2019-06-12 ENCOUNTER — Inpatient Hospital Stay: Payer: BC Managed Care – PPO

## 2019-06-12 ENCOUNTER — Inpatient Hospital Stay: Payer: BC Managed Care – PPO | Attending: Oncology

## 2019-06-12 ENCOUNTER — Inpatient Hospital Stay (HOSPITAL_BASED_OUTPATIENT_CLINIC_OR_DEPARTMENT_OTHER): Payer: BC Managed Care – PPO | Admitting: Oncology

## 2019-06-12 VITALS — BP 160/92 | HR 105 | Temp 98.3°F | Resp 16 | Wt 148.9 lb

## 2019-06-12 DIAGNOSIS — Z79899 Other long term (current) drug therapy: Secondary | ICD-10-CM | POA: Diagnosis not present

## 2019-06-12 DIAGNOSIS — Z5112 Encounter for antineoplastic immunotherapy: Secondary | ICD-10-CM

## 2019-06-12 DIAGNOSIS — R000 Tachycardia, unspecified: Secondary | ICD-10-CM | POA: Diagnosis not present

## 2019-06-12 DIAGNOSIS — Z833 Family history of diabetes mellitus: Secondary | ICD-10-CM | POA: Diagnosis not present

## 2019-06-12 DIAGNOSIS — Z95828 Presence of other vascular implants and grafts: Secondary | ICD-10-CM

## 2019-06-12 DIAGNOSIS — C3491 Malignant neoplasm of unspecified part of right bronchus or lung: Secondary | ICD-10-CM | POA: Diagnosis not present

## 2019-06-12 DIAGNOSIS — C3411 Malignant neoplasm of upper lobe, right bronchus or lung: Secondary | ICD-10-CM | POA: Diagnosis not present

## 2019-06-12 DIAGNOSIS — Z803 Family history of malignant neoplasm of breast: Secondary | ICD-10-CM | POA: Insufficient documentation

## 2019-06-12 DIAGNOSIS — Z801 Family history of malignant neoplasm of trachea, bronchus and lung: Secondary | ICD-10-CM | POA: Insufficient documentation

## 2019-06-12 DIAGNOSIS — D696 Thrombocytopenia, unspecified: Secondary | ICD-10-CM

## 2019-06-12 DIAGNOSIS — Z87891 Personal history of nicotine dependence: Secondary | ICD-10-CM | POA: Diagnosis not present

## 2019-06-12 DIAGNOSIS — Z87442 Personal history of urinary calculi: Secondary | ICD-10-CM | POA: Diagnosis not present

## 2019-06-12 DIAGNOSIS — Z8249 Family history of ischemic heart disease and other diseases of the circulatory system: Secondary | ICD-10-CM | POA: Insufficient documentation

## 2019-06-12 DIAGNOSIS — I10 Essential (primary) hypertension: Secondary | ICD-10-CM | POA: Insufficient documentation

## 2019-06-12 DIAGNOSIS — E538 Deficiency of other specified B group vitamins: Secondary | ICD-10-CM | POA: Diagnosis not present

## 2019-06-12 DIAGNOSIS — J439 Emphysema, unspecified: Secondary | ICD-10-CM | POA: Diagnosis not present

## 2019-06-12 LAB — COMPREHENSIVE METABOLIC PANEL
ALT: 26 U/L (ref 0–44)
AST: 32 U/L (ref 15–41)
Albumin: 3.8 g/dL (ref 3.5–5.0)
Alkaline Phosphatase: 77 U/L (ref 38–126)
Anion gap: 11 (ref 5–15)
BUN: 9 mg/dL (ref 6–20)
CO2: 24 mmol/L (ref 22–32)
Calcium: 9.2 mg/dL (ref 8.9–10.3)
Chloride: 104 mmol/L (ref 98–111)
Creatinine, Ser: 0.61 mg/dL (ref 0.61–1.24)
GFR calc Af Amer: >60 mL/min (ref >60)
GFR calc non Af Amer: >60 mL/min (ref >60)
Glucose, Bld: 104 mg/dL — ABNORMAL HIGH (ref 70–99)
Potassium: 3.5 mmol/L (ref 3.5–5.1)
Sodium: 139 mmol/L (ref 135–145)
Total Bilirubin: 0.9 mg/dL (ref 0.3–1.2)
Total Protein: 7.4 g/dL (ref 6.5–8.1)

## 2019-06-12 LAB — CBC WITH DIFFERENTIAL/PLATELET
Abs Immature Granulocytes: 0.04 10*3/uL (ref 0.00–0.07)
Basophils Absolute: 0.1 10*3/uL (ref 0.0–0.1)
Basophils Relative: 1 %
Eosinophils Absolute: 0.1 10*3/uL (ref 0.0–0.5)
Eosinophils Relative: 1 %
HCT: 38.8 % — ABNORMAL LOW (ref 39.0–52.0)
Hemoglobin: 13.6 g/dL (ref 13.0–17.0)
Immature Granulocytes: 0 %
Lymphocytes Relative: 8 %
Lymphs Abs: 0.9 10*3/uL (ref 0.7–4.0)
MCH: 38.5 pg — ABNORMAL HIGH (ref 26.0–34.0)
MCHC: 35.1 g/dL (ref 30.0–36.0)
MCV: 109.9 fL — ABNORMAL HIGH (ref 80.0–100.0)
Monocytes Absolute: 0.9 10*3/uL (ref 0.1–1.0)
Monocytes Relative: 8 %
Neutro Abs: 9 10*3/uL — ABNORMAL HIGH (ref 1.7–7.7)
Neutrophils Relative %: 82 %
Platelets: 137 10*3/uL — ABNORMAL LOW (ref 150–400)
RBC: 3.53 MIL/uL — ABNORMAL LOW (ref 4.22–5.81)
RDW: 11.9 % (ref 11.5–15.5)
WBC: 11 10*3/uL — ABNORMAL HIGH (ref 4.0–10.5)
nRBC: 0 % (ref 0.0–0.2)

## 2019-06-12 MED ORDER — HEPARIN SOD (PORK) LOCK FLUSH 100 UNIT/ML IV SOLN
500.0000 [IU] | Freq: Once | INTRAVENOUS | Status: AC | PRN
  Administered 2019-06-12: 500 [IU]

## 2019-06-12 MED ORDER — SODIUM CHLORIDE 0.9% FLUSH
10.0000 mL | Freq: Once | INTRAVENOUS | Status: AC
  Administered 2019-06-12: 09:00:00 10 mL via INTRAVENOUS
  Filled 2019-06-12: qty 10

## 2019-06-12 MED ORDER — SODIUM CHLORIDE 0.9 % IV SOLN
620.0000 mg | Freq: Once | INTRAVENOUS | Status: AC
  Administered 2019-06-12: 620 mg via INTRAVENOUS
  Filled 2019-06-12: qty 10

## 2019-06-12 MED ORDER — SODIUM CHLORIDE 0.9 % IV SOLN
Freq: Once | INTRAVENOUS | Status: AC
  Administered 2019-06-12: 11:00:00 via INTRAVENOUS
  Filled 2019-06-12: qty 250

## 2019-06-12 NOTE — Progress Notes (Signed)
HR 105, ok to proceed per MD

## 2019-06-13 ENCOUNTER — Encounter: Payer: Self-pay | Admitting: Oncology

## 2019-06-13 DIAGNOSIS — R002 Palpitations: Secondary | ICD-10-CM | POA: Insufficient documentation

## 2019-06-13 NOTE — Progress Notes (Signed)
Hematology/Oncology Follow up note [facility] [phone] [phone]) [phone]   Patient Care Team: Tracie Harrier, MD as PCP - General (Internal Medicine)  REASON FOR VISIT:  Follow up for treatment of squamous lung cancer.   HISTORY OF PRESENTING ILLNESS:  # Dec 2019 Stage I squamous lung cancer #s/p Bronchoscopy on 06/25/2018. subcarina EBUS FNA was non diagnostic, hypocellular specimen.  # 08/13/2018 CT guided right upper lobe biopsy pathology showed dense fibrosis and mixed inflammatory cells with prominent polytypic plasma cells component. Focal benign bronchial wall and alveolar spaces. No malignancy was identified.   # 08/13/2018 underwent right thoracotomy and wedge resection of a right upper lobe mass.  Frozen section was consistent with an inflammatory process.  On the second postop day, preliminary pathology reports high-grade malignancy.  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  08/20/2018 Patient was therefore taken back to the OR and underwent take complete lobectomy  pT1b pN0 cM0 stage I squamous cell lung cancer.  Margin is negative.  Recommend observation.    # 12/03/2018 CT chest w contrast showed local recurrence.  12/10/2018 PET showed No evidence of distant metastatic disease # 12/26/2018 s/p bronchoscopy biopsy. Confirmed local recurrence of squamous cell lung cancer.   medi port placed by Dr.Oaks.  Cancer treatment Started concurrent chemoradiation on 01/16/2019 Carboplatin AUC of 2 and Taxol 45 mg/m2 weekly finished in 03/05/2019.   INTERVAL HISTORY Samuel Choi is a 53 y.o. male who has above history reviewed by me today presents for evaluation prior to immunotherapy for treatment of  recurrent  squamous cell lung cancer. Patient continues to do well.  Currently on immunotherapy maintenance with durvalumab. Reports feeling well. No new complaints.  His BP is high in the clinic. He reports being complaint with anti  hypertensive medications. Denies any headache.  Patient has been chronically tachycardic the past few clinic visits.  He denies any palpitation, chest pain, shortness of breath more than his usual baseline.  Denies any baseline cardiac problems.  Review of Systems  Constitutional: Negative for appetite change, chills, fatigue, fever and unexpected weight change.  HENT:   Negative for hearing loss and voice change.   Eyes: Negative for eye problems and icterus.  Respiratory: Negative for chest tightness, cough and shortness of breath.   Cardiovascular: Negative for chest pain and leg swelling.  Gastrointestinal: Negative for abdominal distention, abdominal pain and diarrhea.  Endocrine: Negative for hot flashes.  Genitourinary: Negative for difficulty urinating, dysuria and frequency.   Musculoskeletal: Negative for arthralgias.  Skin: Negative for itching and rash.  Neurological: Negative for light-headedness and numbness.  Hematological: Negative for adenopathy. Does not bruise/bleed easily.  Psychiatric/Behavioral: Negative for confusion.    MEDICAL HISTORY:  Past Medical History:  Diagnosis Date  . Alcohol abuse    usually drinks 2-3 drinks per day  . Atherosclerosis 06/2018  . Chronic sinusitis   . Dehydration 02/07/2019  . Emphysema of lung ([place]) 06/2018   patient unaware of this.  . Hip fracture ([place]) 05/2018   no surgery  . History of kidney stones 05/2018   per xray, bilateral nephrolitiasis  . Hypertension   . Squamous cell carcinoma of lung, right ([place]) 06/2018    SURGICAL HISTORY: Past Surgical History:  Procedure Laterality Date  . BRAIN SURGERY  10/2017   nasal/sinus endoscopy. mass benign  . ELECTROMAGNETIC NAVIGATION BROCHOSCOPY Right 06/25/2018   Procedure: ELECTROMAGNETIC NAVIGATION BRONCHOSCOPY;  Surgeon: [name], MD;  Location: [facility] OR[facility];  Service: Cardiopulmonary;  Laterality:  Right;  . ENDOBRONCHIAL ULTRASOUND Right 06/25/2018   Procedure:  ENDOBRONCHIAL ULTRASOUND;  Surgeon: [name], MD;  Location: [facility];  Service: Cardiopulmonary;  Laterality: Right;  . ENDOBRONCHIAL ULTRASOUND Right 12/26/2018   Procedure: ENDOBRONCHIAL ULTRASOUND RIGHT;  Surgeon: [name], MD;  Location: [facility] OR[facility];  Service: Cardiopulmonary;  Laterality: Right;  . FLEXIBLE BRONCHOSCOPY N/A 08/20/2018   Procedure: FLEXIBLE BRONCHOSCOPY PREOP;  Surgeon: [name], MD;  Location: [facility] OR[facility];  Service: Thoracic;  Laterality: N/A;  . IR CV LINE INJECTION  04/04/2019  . NASAL SINUS SURGERY  10/2017   At [facility], frontal sinusotomy, ethmoidectomy, resection anterior cranial fossa neoplasm, turbinate resection  . PORTACATH PLACEMENT Left 01/15/2019   Procedure: INSERTION PORT-A-CATH;  Surgeon: [name], MD;  Location: [facility] OR[facility];  Service: General;  Laterality: Left;  . THORACOTOMY Right 08/13/2018   Procedure: PREOP BROCHOSCOPY WITH RIGHT THORACOTOMY AND RUL RESECTION;  Surgeon: [name], MD;  Location: [facility] OR[facility];  Service: General;  Laterality: Right;  . THORACOTOMY Right 08/20/2018   Procedure: THORACOTOMY MAJOR RIGHT UPPER LOBE LOBECTOMY;  Surgeon: [name], MD;  Location: [facility];  Service: Thoracic;  Laterality: Right;  . TOE SURGERY Left    pin in left toe    SOCIAL HISTORY: Social History   Socioeconomic History  . Marital status: Married    Spouse name: Samuel Choi  . Number of children: Not on file  . Years of education: Not on file  . Highest education level: Not on file  Occupational History  . 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 Financial resource strain: Not on file  . Food insecurity    Worry: Not on file    Inability: Not on file  . Transportation needs    Medical: Not on file    Non-medical: Not on file  Tobacco Use  . Smoking status: Former Smoker    Packs/day: 0.50    Years: 15.00    Pack years: 7.50    Types: Cigarettes    Quit date: 08/2018    Years since quitting: 0.8   . Smokeless tobacco: Never Used  Substance and Sexual Activity  . Alcohol use: Yes    Alcohol/week: 3.0 standard drinks    Types: 3 Cans of beer per week    Comment: usually 2 drinks per week, per patient  . Drug use: No  . Sexual activity: Not on file  Lifestyle  . Physical activity    Days per week: Not on file    Minutes per session: Not on file  . Stress: Not on file  Relationships  . Social Herbalist on phone: Not on file    Gets together: Not on file    Attends religious service: Not on file    Active member of club or organization: Not on file    Attends meetings of clubs or organizations: Not on file    Relationship status: Not on file  . Intimate partner violence    Fear of current or ex partner: Not on file    Emotionally abused: Not on file    Physically abused: Not on file    Forced sexual activity: Not on file  Other Topics Concern  . Not on file  Social History Narrative  . Not on file    FAMILY HISTORY: Family History  Problem Relation Age of Onset  . Breast cancer Mother   . Diabetes Mother   . Lung  cancer Father   . Hypertension Father     ALLERGIES:  has No Known Allergies.  MEDICATIONS:  Current Outpatient Medications  Medication Sig Dispense Refill  . albuterol (VENTOLIN HFA) 108 (90 Base) MCG/ACT inhaler Inhale 2 puffs into the lungs every 6 (six) hours as needed for wheezing or shortness of breath. 8 g 6  . amLODipine (NORVASC) 10 MG tablet Take 10 mg by mouth daily.     . cloNIDine (CATAPRES) 0.1 MG tablet Take 0.1 mg by mouth daily.   5  . folic acid (V-R FOLIC ACID) 338 MCG tablet Take 1 tablet (400 mcg total) by mouth daily. 90 tablet 1  . hydrALAZINE (APRESOLINE) 100 MG tablet Take 50 mg by mouth 2 (two) times daily.     . hydrochlorothiazide (HYDRODIURIL) 12.5 MG tablet Take 12.5 mg by mouth daily.     Marland Kitchen lidocaine-prilocaine (EMLA) cream Apply to affected area once 30 g 3  . magnesium chloride (SLOW-MAG) 64 MG TBEC SR tablet  Take 1 tablet (64 mg total) by mouth 2 (two) times daily. 60 tablet 0  . methocarbamol (ROBAXIN) 500 MG tablet Take 1 tablet (500 mg total) by mouth at bedtime. 30 tablet 0  . olmesartan (BENICAR) 40 MG tablet Take 40 mg by mouth every other day.     . ondansetron (ZOFRAN) 8 MG tablet Take 1 tablet (8 mg total) by mouth 2 (two) times daily as needed for refractory nausea / vomiting. Start on day 3 after chemo. 30 tablet 1  . oxyCODONE-acetaminophen (PERCOCET) 5-325 MG tablet Take 1-2 tablets by mouth every 4 (four) hours as needed for severe pain. 20 tablet 0  . prochlorperazine (COMPAZINE) 10 MG tablet Take 1 tablet (10 mg total) by mouth every 6 (six) hours as needed (Nausea or vomiting). 30 tablet 1  . vitamin B-12 (CYANOCOBALAMIN) 1000 MCG tablet Take 1 tablet (1,000 mcg total) by mouth daily. 90 tablet 1   No current facility-administered medications for this visit.    Facility-Administered Medications Ordered in Other Visits  Medication Dose Route Frequency Provider Last Rate Last Dose  . albuterol (PROVENTIL) (2.5 MG/3ML) 0.083% nebulizer solution 2.5 mg  2.5 mg Nebulization Once System, Provider Not In      . heparin lock flush 100 unit/mL  500 Units Intravenous Once Earlie Server, MD      . sodium chloride flush (NS) 0.9 % injection 10 mL  10 mL Intravenous PRN Earlie Server, MD   10 mL at 04/04/19 0903     PHYSICAL EXAMINATION: ECOG PERFORMANCE STATUS: 0 - Asymptomatic Vitals:   06/12/19 0918 06/12/19 0953  BP: (!) 185/108 (!) 160/92  Pulse:  (!) 105  Resp: 16   Temp: 98.3 F (36.8 C)    Filed Weights   06/12/19 0918  Weight: 148 lb 14.4 oz (67.5 kg)    Physical Exam Constitutional:      General: He is not in acute distress.    Comments: Thin  HENT:     Head: Normocephalic and atraumatic.  Eyes:     General: No scleral icterus.    Pupils: Pupils are equal, round, and reactive to light.  Neck:     Musculoskeletal: Normal range of motion and neck supple.  Cardiovascular:      Rate and Rhythm: Regular rhythm. Tachycardia present.     Heart sounds: Normal heart sounds.  Pulmonary:     Effort: Pulmonary effort is normal. No respiratory distress.     Breath sounds: No wheezing.  Abdominal:     General: Bowel sounds are normal. There is no distension.     Palpations: Abdomen is soft. There is no mass.     Tenderness: There is no abdominal tenderness.  Musculoskeletal: Normal range of motion.        General: No deformity.  Skin:    General: Skin is warm and dry.     Findings: No erythema or rash.  Neurological:     Mental Status: He is alert and oriented to person, place, and time.     Cranial Nerves: No cranial nerve deficit.     Coordination: Coordination normal.  Psychiatric:        Behavior: Behavior normal.        Thought Content: Thought content normal.      LABORATORY DATA:  I have reviewed the data as listed Lab Results  Component Value Date   WBC 11.0 (H) 06/12/2019   HGB 13.6 06/12/2019   HCT 38.8 (L) 06/12/2019   MCV 109.9 (H) 06/12/2019   PLT 137 (L) 06/12/2019   Recent Labs    05/08/19 0901 05/22/19 0911 06/12/19 0903  NA 143 139 139  K 4.1 3.6 3.5  CL 110 104 104  CO2 _0 GLUCOSE 120* 129* 104*  BUN _1 CREATININE 0.93 0.98 0.61  CALCIUM 9.2 9.4 9.2  GFRNONAA >60 >60 >60  GFRAA >60 >60 >60  PROT 7.0 7.6 7.4  ALBUMIN 3.8 4.1 3.8  AST 39 67* 32  ALT 32 32 26  ALKPHOS 70 77 77  BILITOT 0.4 0.9 0.9   Iron/TIBC/Ferritin/ %Sat No results found for: IRON, TIBC, FERRITIN, IRONPCTSAT   Pathology 12/26/2018 Right hilar adenopathy/mass-EUS guided FNA Positive for malignancy, non small cell carcinoma, morphologically similar to previous right upper lobe squamous cell carcinoma.   ASSESSMENT & PLAN:  1. Squamous cell carcinoma of lung, right ([place])   2. Thrombocytopenia ([place])   3. B12 deficiency   4. Encounter for antineoplastic immunotherapy   5. Folate deficiency    # Recurrent Squamous cell carcinoma of lung,   Patient tolerates immunotherapy maintenance well.  No significant signs of immunotherapy related toxicities. Labs are reviewed and discussed with patient. Next CT ordered by RadOnc.   #B12 deficiency, level has improved. Macrocytosis has improved.  Continue oral vitamin b12 supplementation.   # Folic deficiency, continue folate deficiency.   #Thrombocytopenia, platelet count 137,000.  monitor #Tachycardia, possible due to alcohol cessation.  Denies any baseline cardiac problems.  He is asymptomatic.  TSH has been checked limits.previousl referred to Dr. Ubaldo Glassing for further evaluation.  Return of visit: 2 weeks.    Earlie Server, MD, PhD Hematology Oncology [facility] technician smear is a possible [facility] 06/13/2019

## 2019-06-25 ENCOUNTER — Other Ambulatory Visit: Payer: Self-pay

## 2019-06-25 NOTE — Progress Notes (Signed)
Patient pre screened for office appointment, no questions or concerns today. Patient reminded of upcoming appointment time and date. 

## 2019-06-26 ENCOUNTER — Other Ambulatory Visit: Payer: Self-pay

## 2019-06-26 ENCOUNTER — Inpatient Hospital Stay (HOSPITAL_BASED_OUTPATIENT_CLINIC_OR_DEPARTMENT_OTHER): Payer: BC Managed Care – PPO | Admitting: Oncology

## 2019-06-26 ENCOUNTER — Inpatient Hospital Stay: Payer: BC Managed Care – PPO

## 2019-06-26 ENCOUNTER — Encounter: Payer: Self-pay | Admitting: Oncology

## 2019-06-26 VITALS — BP 166/103 | HR 102 | Temp 98.5°F | Resp 18 | Wt 146.4 lb

## 2019-06-26 VITALS — BP 163/99 | HR 89

## 2019-06-26 DIAGNOSIS — E538 Deficiency of other specified B group vitamins: Secondary | ICD-10-CM

## 2019-06-26 DIAGNOSIS — C3491 Malignant neoplasm of unspecified part of right bronchus or lung: Secondary | ICD-10-CM | POA: Diagnosis not present

## 2019-06-26 DIAGNOSIS — R197 Diarrhea, unspecified: Secondary | ICD-10-CM

## 2019-06-26 DIAGNOSIS — Z95828 Presence of other vascular implants and grafts: Secondary | ICD-10-CM

## 2019-06-26 DIAGNOSIS — Z5112 Encounter for antineoplastic immunotherapy: Secondary | ICD-10-CM | POA: Diagnosis not present

## 2019-06-26 DIAGNOSIS — C3411 Malignant neoplasm of upper lobe, right bronchus or lung: Secondary | ICD-10-CM | POA: Diagnosis not present

## 2019-06-26 DIAGNOSIS — D696 Thrombocytopenia, unspecified: Secondary | ICD-10-CM

## 2019-06-26 DIAGNOSIS — E860 Dehydration: Secondary | ICD-10-CM

## 2019-06-26 DIAGNOSIS — R000 Tachycardia, unspecified: Secondary | ICD-10-CM

## 2019-06-26 LAB — CBC WITH DIFFERENTIAL/PLATELET
Abs Immature Granulocytes: 0.02 10*3/uL (ref 0.00–0.07)
Basophils Absolute: 0.1 10*3/uL (ref 0.0–0.1)
Basophils Relative: 1 %
Eosinophils Absolute: 0 10*3/uL (ref 0.0–0.5)
Eosinophils Relative: 0 %
HCT: 41.1 % (ref 39.0–52.0)
Hemoglobin: 13.9 g/dL (ref 13.0–17.0)
Immature Granulocytes: 0 %
Lymphocytes Relative: 7 %
Lymphs Abs: 0.7 10*3/uL (ref 0.7–4.0)
MCH: 37.4 pg — ABNORMAL HIGH (ref 26.0–34.0)
MCHC: 33.8 g/dL (ref 30.0–36.0)
MCV: 110.5 fL — ABNORMAL HIGH (ref 80.0–100.0)
Monocytes Absolute: 0.8 10*3/uL (ref 0.1–1.0)
Monocytes Relative: 9 %
Neutro Abs: 8.2 10*3/uL — ABNORMAL HIGH (ref 1.7–7.7)
Neutrophils Relative %: 83 %
Platelets: 172 10*3/uL (ref 150–400)
RBC: 3.72 MIL/uL — ABNORMAL LOW (ref 4.22–5.81)
RDW: 12.3 % (ref 11.5–15.5)
WBC: 9.8 10*3/uL (ref 4.0–10.5)
nRBC: 0.3 % — ABNORMAL HIGH (ref 0.0–0.2)

## 2019-06-26 LAB — MAGNESIUM: Magnesium: 1.6 mg/dL — ABNORMAL LOW (ref 1.7–2.4)

## 2019-06-26 LAB — COMPREHENSIVE METABOLIC PANEL
ALT: 27 U/L (ref 0–44)
AST: 38 U/L (ref 15–41)
Albumin: 3.6 g/dL (ref 3.5–5.0)
Alkaline Phosphatase: 90 U/L (ref 38–126)
Anion gap: 11 (ref 5–15)
BUN: 9 mg/dL (ref 6–20)
CO2: 25 mmol/L (ref 22–32)
Calcium: 9 mg/dL (ref 8.9–10.3)
Chloride: 108 mmol/L (ref 98–111)
Creatinine, Ser: 0.8 mg/dL (ref 0.61–1.24)
GFR calc Af Amer: >60 mL/min (ref >60)
GFR calc non Af Amer: >60 mL/min (ref >60)
Glucose, Bld: 114 mg/dL — ABNORMAL HIGH (ref 70–99)
Potassium: 3.6 mmol/L (ref 3.5–5.1)
Sodium: 144 mmol/L (ref 135–145)
Total Bilirubin: 0.8 mg/dL (ref 0.3–1.2)
Total Protein: 7.1 g/dL (ref 6.5–8.1)

## 2019-06-26 MED ORDER — MAGNESIUM SULFATE 2 GM/50ML IV SOLN: 2.0000 g | Freq: Once | INTRAVENOUS | Status: DC

## 2019-06-26 MED ORDER — SODIUM CHLORIDE 0.9 % IV SOLN: 2.0000 g | Freq: Once | INTRAVENOUS | Status: DC

## 2019-06-26 MED ORDER — SODIUM CHLORIDE 0.9 % IV SOLN
Freq: Once | INTRAVENOUS | Status: AC
  Filled 2019-06-26: qty 500

## 2019-06-26 MED ORDER — SODIUM CHLORIDE 0.9 % IV SOLN
9.7000 mg/kg | Freq: Once | INTRAVENOUS | Status: AC
  Administered 2019-06-26: 620 mg via INTRAVENOUS
  Filled 2019-06-26: qty 2.4

## 2019-06-26 MED ORDER — MAGNESIUM CHLORIDE 64 MG PO TBEC: 1.0000 | DELAYED_RELEASE_TABLET | Freq: Every day | ORAL | 1 refills | Status: DC

## 2019-06-26 MED ORDER — SODIUM CHLORIDE 0.9 % IV SOLN
Freq: Once | INTRAVENOUS | Status: AC
  Filled 2019-06-26: qty 250

## 2019-06-26 MED ORDER — HEPARIN SOD (PORK) LOCK FLUSH 100 UNIT/ML IV SOLN
500.0000 [IU] | Freq: Once | INTRAVENOUS | Status: AC | PRN
  Administered 2019-06-26: 500 [IU]
  Filled 2019-06-26: qty 5

## 2019-06-26 MED ORDER — HEPARIN SOD (PORK) LOCK FLUSH 100 UNIT/ML IV SOLN
INTRAVENOUS | Status: AC
  Filled 2019-06-26: qty 5

## 2019-06-26 MED ORDER — SODIUM CHLORIDE 0.9 % IV SOLN
INTRAVENOUS | Status: DC
  Filled 2019-06-26: qty 250

## 2019-06-26 NOTE — Addendum Note (Signed)
Addended by: Vanice Sarah on: 06/26/2019 04:07 PM   Modules accepted: Orders

## 2019-06-26 NOTE — Progress Notes (Signed)
Patient does not offer any problems today.  

## 2019-06-26 NOTE — Progress Notes (Signed)
Hematology/Oncology Follow up note [facility] [phone] [phone]   Patient Care Team: Tracie Harrier, MD as PCP - General (Internal Medicine)  REASON FOR VISIT:  Follow up for treatment of squamous lung cancer.   HISTORY OF PRESENTING ILLNESS:  # Dec 2019 Stage I squamous lung cancer #s/p Bronchoscopy on 06/25/2018. subcarina EBUS FNA was non diagnostic, hypocellular specimen.  # 08/13/2018 CT guided right upper lobe biopsy pathology showed dense fibrosis and mixed inflammatory cells with prominent polytypic plasma cells component. Focal benign bronchial wall and alveolar spaces. No malignancy was identified.   # 08/13/2018 underwent right thoracotomy and wedge resection of a right upper lobe mass.  Frozen section was consistent with an inflammatory process.  On the second postop day, preliminary pathology reports high-grade malignancy.  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  08/20/2018 Patient was therefore taken back to the OR and underwent take complete lobectomy  pT1b pN0 cM0 stage I squamous cell lung cancer.  Margin is negative.  Recommend observation.    # 12/03/2018 CT chest w contrast showed local recurrence.  12/10/2018 PET showed No evidence of distant metastatic disease # 12/26/2018 s/p bronchoscopy biopsy. Confirmed local recurrence of squamous cell lung cancer.   medi port placed by Dr.Oaks.  Cancer treatment Started concurrent chemoradiation on 01/16/2019 Carboplatin AUC of 2 and Taxol 45 mg/m2 weekly finished in 03/05/2019.   INTERVAL HISTORY Samuel Choi is a 53 y.o. male who has above history reviewed by me today presents for evaluation prior to immunotherapy for treatment of  recurrent  squamous cell lung cancer. Patient continues to do well.  Currently on immunotherapy maintenance with durvalumab. He reports feeling well. Denies any new complaints.  With further questioning, he reports new onset 2 loose/soft  bowel movement for the past few days. .  Denies any fever chill, chest pain, nausea, vomiting, skin rash, abdominal pain.  He has seen Dr.Fath for tachycardia and has had work up. His blood pressure medication has been adjusted.  BP is high in the clinic. He reports being complaint with BP meds and measures his BP at home and readings are ok.    Review of Systems  Constitutional: Negative for appetite change, chills, fatigue, fever and unexpected weight change.  HENT:   Negative for hearing loss and voice change.   Eyes: Negative for eye problems and icterus.  Respiratory: Negative for chest tightness, cough and shortness of breath.   Cardiovascular: Negative for chest pain and leg swelling.  Gastrointestinal: Positive for diarrhea. Negative for abdominal distention and abdominal pain.  Endocrine: Negative for hot flashes.  Genitourinary: Negative for difficulty urinating, dysuria and frequency.   Musculoskeletal: Negative for arthralgias.  Skin: Negative for itching and rash.  Neurological: Negative for light-headedness and numbness.  Hematological: Negative for adenopathy. Does not bruise/bleed easily.  Psychiatric/Behavioral: Negative for confusion.    MEDICAL HISTORY:  Past Medical History:  Diagnosis Date  . Alcohol abuse    usually drinks 2-3 drinks per day  . Atherosclerosis 06/2018  . Chronic sinusitis   . Dehydration 02/07/2019  . Emphysema of lung ([place]) 06/2018   patient unaware of this.  . Hip fracture ([place]) 05/2018   no surgery  . History of kidney stones 05/2018   per xray, bilateral nephrolitiasis  . Hypertension   . Squamous cell carcinoma of lung, right ([place]) 06/2018    SURGICAL HISTORY: Past Surgical History:  Procedure Laterality Date  . BRAIN SURGERY  10/2017   nasal/sinus endoscopy.  mass benign  . ELECTROMAGNETIC NAVIGATION BROCHOSCOPY Right 06/25/2018   Procedure: ELECTROMAGNETIC NAVIGATION BRONCHOSCOPY;  Surgeon: [name], MD;  Location: [facility]  ORS;  Service: Cardiopulmonary;  Laterality: Right;  . ENDOBRONCHIAL ULTRASOUND Right 06/25/2018   Procedure: ENDOBRONCHIAL ULTRASOUND;  Surgeon: [name], MD;  Location: [facility] ORS;  Service: Cardiopulmonary;  Laterality: Right;  . ENDOBRONCHIAL ULTRASOUND Right 12/26/2018   Procedure: ENDOBRONCHIAL ULTRASOUND RIGHT;  Surgeon: [name], MD;  Location: [facility] ORS;  Service: Cardiopulmonary;  Laterality: Right;  . FLEXIBLE BRONCHOSCOPY N/A 08/20/2018   Procedure: FLEXIBLE BRONCHOSCOPY PREOP;  Surgeon: [name], MD;  Location: [facility] ORS;  Service: Thoracic;  Laterality: N/A;  . IR CV LINE INJECTION  04/04/2019  . NASAL SINUS SURGERY  10/2017   At [facility], frontal sinusotomy, ethmoidectomy, resection anterior cranial fossa neoplasm, turbinate resection  . PORTACATH PLACEMENT Left 01/15/2019   Procedure: INSERTION PORT-A-CATH;  Surgeon: [name], MD;  Location: [facility] ORS;  Service: General;  Laterality: Left;  . THORACOTOMY Right 08/13/2018   Procedure: PREOP BROCHOSCOPY WITH RIGHT THORACOTOMY AND RUL RESECTION;  Surgeon: [name], MD;  Location: [facility] ORS;  Service: General;  Laterality: Right;  . THORACOTOMY Right 08/20/2018   Procedure: THORACOTOMY MAJOR RIGHT UPPER LOBE LOBECTOMY;  Surgeon: [name], MD;  Location: [facility] ORS;  Service: Thoracic;  Laterality: Right;  . TOE SURGERY Left    pin in left toe    SOCIAL HISTORY: Social History   Socioeconomic History  . Marital status: Married    Spouse name: lisa  . Number of children: Not on file  . Years of education: Not on file  . Highest education level: Not on file  Occupational History  . Occupation: welding    Comment: taking time off to resolve issues  Tobacco Use  . Smoking status: Former Smoker    Packs/day: 0.50    Years: 15.00    Pack years: 7.50    Types: Cigarettes    Quit date: 08/2018    Years since quitting: 0.8  . Smokeless tobacco: Never Used  Substance and Sexual Activity  . Alcohol use: Yes     Alcohol/week: 3.0 standard drinks    Types: 3 Cans of beer per week    Comment: usually 2 drinks per week, per patient  . Drug use: No  . Sexual activity: Not on file  Other Topics Concern  . Not on file  Social History Narrative  . Not on file   Social Determinants of Health   Financial Resource Strain:   . Difficulty of Paying Living Expenses: Not on file  Food Insecurity:   . Worried About Charity fundraiser in the Last Year: Not on file  . Ran Out of Food in the Last Year: Not on file  Transportation Needs:   . Lack of Transportation (Medical): Not on file  . Lack of Transportation (Non-Medical): Not on file  Physical Activity:   . Days of Exercise per Week: Not on file  . Minutes of Exercise per Session: Not on file  Stress:   . Feeling of Stress : Not on file  Social Connections:   . Frequency of Communication with Friends and Family: Not on file  . Frequency of Social Gatherings with Friends and Family: Not on file  . Attends Religious Services: Not on file  . Active Member of Clubs or Organizations: Not on file  . Attends Archivist Meetings: Not on file  . Marital Status: Not on file  Intimate Partner Violence:   .  Fear of Current or Ex-Partner: Not on file  . Emotionally Abused: Not on file  . Physically Abused: Not on file  . Sexually Abused: Not on file    FAMILY HISTORY: Family History  Problem Relation Age of Onset  . Breast cancer Mother   . Diabetes Mother   . Lung cancer Father   . Hypertension Father     ALLERGIES:  has No Known Allergies.  MEDICATIONS:  Current Outpatient Medications  Medication Sig Dispense Refill  . albuterol (VENTOLIN HFA) 108 (90 Base) MCG/ACT inhaler Inhale 2 puffs into the lungs every 6 (six) hours as needed for wheezing or shortness of breath. 8 g 6  . cloNIDine (CATAPRES) 0.1 MG tablet Take 0.1 mg by mouth daily.   5  . diltiazem (CARDIZEM CD) 120 MG 24 hr capsule Take 120 mg by mouth daily.    . folic acid  (V-R FOLIC ACID) 532 MCG tablet Take 1 tablet (400 mcg total) by mouth daily. 90 tablet 1  . hydrALAZINE (APRESOLINE) 100 MG tablet Take 50 mg by mouth 2 (two) times daily.     . hydrochlorothiazide (HYDRODIURIL) 12.5 MG tablet Take 12.5 mg by mouth daily.     Marland Kitchen lidocaine-prilocaine (EMLA) cream Apply to affected area once 30 g 3  . magnesium chloride (SLOW-MAG) 64 MG TBEC SR tablet Take 1 tablet (64 mg total) by mouth 2 (two) times daily. 60 tablet 0  . methocarbamol (ROBAXIN) 500 MG tablet Take 1 tablet (500 mg total) by mouth at bedtime. 30 tablet 0  . olmesartan (BENICAR) 40 MG tablet Take 40 mg by mouth every other day.     . ondansetron (ZOFRAN) 8 MG tablet Take 1 tablet (8 mg total) by mouth 2 (two) times daily as needed for refractory nausea / vomiting. Start on day 3 after chemo. 30 tablet 1  . oxyCODONE-acetaminophen (PERCOCET) 5-325 MG tablet Take 1-2 tablets by mouth every 4 (four) hours as needed for severe pain. 20 tablet 0  . prochlorperazine (COMPAZINE) 10 MG tablet Take 1 tablet (10 mg total) by mouth every 6 (six) hours as needed (Nausea or vomiting). 30 tablet 1  . vitamin B-12 (CYANOCOBALAMIN) 1000 MCG tablet Take 1 tablet (1,000 mcg total) by mouth daily. 90 tablet 1   No current facility-administered medications for this visit.   Facility-Administered Medications Ordered in Other Visits  Medication Dose Route Frequency Provider Last Rate Last Admin  . albuterol (PROVENTIL) (2.5 MG/3ML) 0.083% nebulizer solution 2.5 mg  2.5 mg Nebulization Once System, Provider Not In      . heparin lock flush 100 unit/mL  500 Units Intravenous Once Earlie Server, MD      . sodium chloride flush (NS) 0.9 % injection 10 mL  10 mL Intravenous PRN Earlie Server, MD   10 mL at 04/04/19 0903     PHYSICAL EXAMINATION: ECOG PERFORMANCE STATUS: 0 - Asymptomatic Vitals:   06/26/19 0842  Pulse: (!) 102  Resp: 18  Temp: 98.5 F (36.9 C)   Filed Weights   06/26/19 0842  Weight: 146 lb 6.4 oz (66.4 kg)     Physical Exam Constitutional:      General: He is not in acute distress.    Comments: Thin  HENT:     Head: Normocephalic and atraumatic.  Eyes:     General: No scleral icterus.    Pupils: Pupils are equal, round, and reactive to light.  Cardiovascular:     Rate and Rhythm: Regular rhythm. Tachycardia present.  Heart sounds: Normal heart sounds.  Pulmonary:     Effort: Pulmonary effort is normal. No respiratory distress.     Breath sounds: No wheezing.  Abdominal:     General: Bowel sounds are normal. There is no distension.     Palpations: Abdomen is soft. There is no mass.     Tenderness: There is no abdominal tenderness.  Musculoskeletal:        General: No deformity. Normal range of motion.     Cervical back: Normal range of motion and neck supple.  Skin:    General: Skin is warm and dry.     Findings: No erythema or rash.  Neurological:     Mental Status: He is alert and oriented to person, place, and time.     Cranial Nerves: No cranial nerve deficit.     Coordination: Coordination normal.  Psychiatric:        Behavior: Behavior normal.        Thought Content: Thought content normal.      LABORATORY DATA:  I have reviewed the data as listed Lab Results  Component Value Date   WBC 9.8 06/26/2019   HGB 13.9 06/26/2019   HCT 41.1 06/26/2019   MCV 110.5 (H) 06/26/2019   PLT 172 06/26/2019   Recent Labs    05/22/19 0911 06/12/19 0903 06/26/19 0810  NA 139 139 144  K 3.6 3.5 3.6  CL 104 104 108  CO2 _0 GLUCOSE 129* 104* 114*  BUN _1 CREATININE 0.98 0.61 0.80  CALCIUM 9.4 9.2 9.0  GFRNONAA >60 >60 >60  GFRAA >60 >60 >60  PROT 7.6 7.4 7.1  ALBUMIN 4.1 3.8 3.6  AST 67* 32 38  ALT 32 26 27  ALKPHOS 77 77 90  BILITOT 0.9 0.9 0.8   Iron/TIBC/Ferritin/ %Sat No results found for: IRON, TIBC, FERRITIN, IRONPCTSAT   Pathology 12/26/2018 Right hilar adenopathy/mass-EUS guided FNA Positive for malignancy, non small cell carcinoma,  morphologically similar to previous right upper lobe squamous cell carcinoma.   ASSESSMENT & PLAN:  1. Squamous cell carcinoma of lung, right ([place])   2. Thrombocytopenia ([place])   3. Encounter for antineoplastic immunotherapy   4. B12 deficiency   5. Folate deficiency   6. Tachycardia   7. Hypomagnesemia    #Recurrent Squamous cell carcinoma of lung,  Patient tolerates immunotherapy maintenance well.   Labs are reviewed and discussed with patient.  Counts are ok to proceed with today's durvalumab.   #B12 deficiency, level has improved. Macrocytosis has improved.  Continue  oral vitamin b12 supplementation.   # Folic deficiency, continue folate deficiency.   #Thrombocytopenia, platelet count is at 172000.  monitor #Tachycardia,  Follow up with Dr.Fath.  # Loose stool episodes, intermittent.  Most likely immunotherapy side effects. Continue close monitor.  # Hypomagnesia - give IV Magnesium sulfate  2 g x 1.  #High blood pressure, patient will have recheck blood pressure at infusion center.  If persistently have systolic blood pressure above 175, patient's infusion will be rescheduled.  Advised patient to check blood pressure at home if is persistently elevated he needs to discuss with primary care provider and cardiologist.  Recommend patient to resume Slow Mag once daily. Refills sent to pharmacy.   Return of visit: 2 weeks.    Earlie Server, MD, PhD Hematology Oncology [facility] technician smear is a possible [facility] 06/26/2019

## 2019-06-27 ENCOUNTER — Other Ambulatory Visit: Payer: Self-pay

## 2019-06-27 DIAGNOSIS — C3411 Malignant neoplasm of upper lobe, right bronchus or lung: Secondary | ICD-10-CM | POA: Diagnosis not present

## 2019-06-27 DIAGNOSIS — C3491 Malignant neoplasm of unspecified part of right bronchus or lung: Secondary | ICD-10-CM

## 2019-06-27 DIAGNOSIS — R197 Diarrhea, unspecified: Secondary | ICD-10-CM

## 2019-06-27 DIAGNOSIS — A498 Other bacterial infections of unspecified site: Secondary | ICD-10-CM

## 2019-06-27 HISTORY — DX: Other bacterial infections of unspecified site: A49.8

## 2019-06-27 LAB — C DIFFICILE QUICK SCREEN W PCR REFLEX
C Diff antigen: POSITIVE — AB
C Diff toxin: NEGATIVE

## 2019-06-27 LAB — CLOSTRIDIUM DIFFICILE BY PCR, REFLEXED: Toxigenic C. Difficile by PCR: POSITIVE — AB

## 2019-07-03 ENCOUNTER — Telehealth: Payer: Self-pay | Admitting: Oncology

## 2019-07-03 NOTE — Telephone Encounter (Signed)
Stool C. difficile positive, little to no toxin production. I called the patient and he informs me that his diarrhea has resolved. Lab results were communicated to patient.  Since his symptoms has resolved.  No need for antibiotic treatment at this point.

## 2019-07-10 ENCOUNTER — Inpatient Hospital Stay: Payer: BC Managed Care – PPO

## 2019-07-10 ENCOUNTER — Ambulatory Visit
Admission: RE | Admit: 2019-07-10 | Discharge: 2019-07-10 | Disposition: A | Discharge status: Home / Self Care | Payer: Self-pay | Source: Ambulatory Visit | Attending: Oncology | Admitting: Oncology

## 2019-07-10 ENCOUNTER — Encounter: Payer: Self-pay | Admitting: Oncology

## 2019-07-10 ENCOUNTER — Inpatient Hospital Stay (HOSPITAL_BASED_OUTPATIENT_CLINIC_OR_DEPARTMENT_OTHER): Payer: BC Managed Care – PPO | Admitting: Oncology

## 2019-07-10 ENCOUNTER — Other Ambulatory Visit: Payer: Self-pay

## 2019-07-10 VITALS — BP 171/107 | HR 98 | Temp 96.7°F | Resp 18 | Wt 149.0 lb

## 2019-07-10 DIAGNOSIS — Z95828 Presence of other vascular implants and grafts: Secondary | ICD-10-CM

## 2019-07-10 DIAGNOSIS — C3491 Malignant neoplasm of unspecified part of right bronchus or lung: Secondary | ICD-10-CM

## 2019-07-10 DIAGNOSIS — C3411 Malignant neoplasm of upper lobe, right bronchus or lung: Secondary | ICD-10-CM | POA: Diagnosis not present

## 2019-07-10 DIAGNOSIS — I10 Essential (primary) hypertension: Secondary | ICD-10-CM

## 2019-07-10 DIAGNOSIS — E538 Deficiency of other specified B group vitamins: Secondary | ICD-10-CM

## 2019-07-10 DIAGNOSIS — D696 Thrombocytopenia, unspecified: Secondary | ICD-10-CM

## 2019-07-10 DIAGNOSIS — Z5112 Encounter for antineoplastic immunotherapy: Secondary | ICD-10-CM

## 2019-07-10 LAB — CBC WITH DIFFERENTIAL/PLATELET
Abs Immature Granulocytes: 0.03 10*3/uL (ref 0.00–0.07)
Basophils Absolute: 0 10*3/uL (ref 0.0–0.1)
Basophils Relative: 0 %
Eosinophils Absolute: 0.1 10*3/uL (ref 0.0–0.5)
Eosinophils Relative: 2 %
HCT: 40.4 % (ref 39.0–52.0)
Hemoglobin: 13.7 g/dL (ref 13.0–17.0)
Immature Granulocytes: 0 %
Lymphocytes Relative: 9 %
Lymphs Abs: 0.7 10*3/uL (ref 0.7–4.0)
MCH: 37.1 pg — ABNORMAL HIGH (ref 26.0–34.0)
MCHC: 33.9 g/dL (ref 30.0–36.0)
MCV: 109.5 fL — ABNORMAL HIGH (ref 80.0–100.0)
Monocytes Absolute: 0.7 10*3/uL (ref 0.1–1.0)
Monocytes Relative: 10 %
Neutro Abs: 5.8 10*3/uL (ref 1.7–7.7)
Neutrophils Relative %: 79 %
Platelets: 146 10*3/uL — ABNORMAL LOW (ref 150–400)
RBC: 3.69 MIL/uL — ABNORMAL LOW (ref 4.22–5.81)
RDW: 12.3 % (ref 11.5–15.5)
WBC: 7.4 10*3/uL (ref 4.0–10.5)
nRBC: 0 % (ref 0.0–0.2)

## 2019-07-10 LAB — COMPREHENSIVE METABOLIC PANEL
ALT: 25 U/L (ref 0–44)
AST: 45 U/L — ABNORMAL HIGH (ref 15–41)
Albumin: 3.7 g/dL (ref 3.5–5.0)
Alkaline Phosphatase: 75 U/L (ref 38–126)
Anion gap: 13 (ref 5–15)
BUN: 9 mg/dL (ref 6–20)
CO2: 24 mmol/L (ref 22–32)
Calcium: 9.3 mg/dL (ref 8.9–10.3)
Chloride: 106 mmol/L (ref 98–111)
Creatinine, Ser: 0.76 mg/dL (ref 0.61–1.24)
GFR calc Af Amer: >60 mL/min (ref >60)
GFR calc non Af Amer: >60 mL/min (ref >60)
Glucose, Bld: 84 mg/dL (ref 70–99)
Potassium: 3.4 mmol/L — ABNORMAL LOW (ref 3.5–5.1)
Sodium: 143 mmol/L (ref 135–145)
Total Bilirubin: 0.4 mg/dL (ref 0.3–1.2)
Total Protein: 7.5 g/dL (ref 6.5–8.1)

## 2019-07-10 LAB — MAGNESIUM: Magnesium: 1.7 mg/dL (ref 1.7–2.4)

## 2019-07-10 MED ORDER — SODIUM CHLORIDE 0.9% FLUSH
10.0000 mL | Freq: Once | INTRAVENOUS | Status: AC
  Administered 2019-07-10: 10 mL via INTRAVENOUS
  Filled 2019-07-10: qty 10

## 2019-07-10 MED ORDER — IOHEXOL 300 MG/ML  SOLN
10.0000 mL | Freq: Once | INTRAMUSCULAR | Status: AC | PRN
  Administered 2019-07-10: 10 mL

## 2019-07-10 MED ORDER — HEPARIN SOD (PORK) LOCK FLUSH 100 UNIT/ML IV SOLN
500.0000 [IU] | Freq: Once | INTRAVENOUS | Status: AC
  Administered 2019-07-10: 500 [IU] via INTRAVENOUS
  Filled 2019-07-10: qty 5

## 2019-07-10 MED ORDER — ALTEPLASE 2 MG IJ SOLR
2.0000 mg | Freq: Once | INTRAMUSCULAR | Status: AC | PRN
  Administered 2019-07-10: 2 mg
  Filled 2019-07-10: qty 2

## 2019-07-10 NOTE — Progress Notes (Signed)
Patient here for follow up. No concerns voiced.  °

## 2019-07-10 NOTE — Progress Notes (Signed)
1215 cathflo given as ordered 1245 no blood return, at 30 minutes

## 2019-07-10 NOTE — Progress Notes (Signed)
Hematology/Oncology Follow up note [facility] [phone] [phone]   Patient Care Team: Tracie Harrier, MD as PCP - General (Internal Medicine)  REASON FOR VISIT:  Follow up for treatment of squamous lung cancer.   HISTORY OF PRESENTING ILLNESS:  # Dec 2019 Stage I squamous lung cancer #s/p Bronchoscopy on 06/25/2018. subcarina EBUS FNA was non diagnostic, hypocellular specimen.  # 08/13/2018 CT guided right upper lobe biopsy pathology showed dense fibrosis and mixed inflammatory cells with prominent polytypic plasma cells component. Focal benign bronchial wall and alveolar spaces. No malignancy was identified.   # 08/13/2018 underwent right thoracotomy and wedge resection of a right upper lobe mass.  Frozen section was consistent with an inflammatory process.  On the second postop day, preliminary pathology reports high-grade malignancy.  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  08/20/2018 Patient was therefore taken back to the OR and underwent take complete lobectomy  pT1b pN0 cM0 stage I squamous cell lung cancer.  Margin is negative.  Recommend observation.    # 12/03/2018 CT chest w contrast showed local recurrence.  12/10/2018 PET showed No evidence of distant metastatic disease # 12/26/2018 s/p bronchoscopy biopsy. Confirmed local recurrence of squamous cell lung cancer.   medi port placed by Dr.Oaks.  Cancer treatment Started concurrent chemoradiation on 01/16/2019 Carboplatin AUC of 2 and Taxol 45 mg/m2 weekly finished in 03/05/2019.   INTERVAL HISTORY Samuel Choi is a 53 y.o. male who has above history reviewed by me today presents for evaluation prior to immunotherapy for treatment of  recurrent  squamous cell lung cancer. Patient continues to do well.  Currently on immunotherapy maintenance with durvalumab. He reports feeling well. Denies any new complaints.  Diarrhea has completely resolved.  Denies any nausea,  vomiting, diarrhea, abdominal pain.  Denies any skin rash. He reports being compliant with her blood pressure medication. His blood pressure was initially high in the clinic 180/100. His medical port has no blood return.   Review of Systems  Constitutional: Negative for appetite change, chills, fatigue, fever and unexpected weight change.  HENT:   Negative for hearing loss and voice change.   Eyes: Negative for eye problems and icterus.  Respiratory: Negative for chest tightness, cough and shortness of breath.   Cardiovascular: Negative for chest pain and leg swelling.  Gastrointestinal: Negative for abdominal distention, abdominal pain and diarrhea.  Endocrine: Negative for hot flashes.  Genitourinary: Negative for difficulty urinating, dysuria and frequency.   Musculoskeletal: Negative for arthralgias.  Skin: Negative for itching and rash.  Neurological: Negative for light-headedness and numbness.  Hematological: Negative for adenopathy. Does not bruise/bleed easily.  Psychiatric/Behavioral: Negative for confusion.    MEDICAL HISTORY:  Past Medical History:  Diagnosis Date  . Alcohol abuse    usually drinks 2-3 drinks per day  . Atherosclerosis 06/2018  . Chronic sinusitis   . Dehydration 02/07/2019  . Emphysema of lung ([place]) 06/2018   patient unaware of this.  . Hip fracture ([place]) 05/2018   no surgery  . History of kidney stones 05/2018   per xray, bilateral nephrolitiasis  . Hypertension   . Squamous cell carcinoma of lung, right ([place]) 06/2018    SURGICAL HISTORY: Past Surgical History:  Procedure Laterality Date  . BRAIN SURGERY  10/2017   nasal/sinus endoscopy. mass benign  . ELECTROMAGNETIC NAVIGATION BROCHOSCOPY Right 06/25/2018   Procedure: ELECTROMAGNETIC NAVIGATION BRONCHOSCOPY;  Surgeon: [name], MD;  Location: [facility] ORS;  Service: Cardiopulmonary;  Laterality: Right;  . ENDOBRONCHIAL  ULTRASOUND Right 06/25/2018   Procedure: ENDOBRONCHIAL ULTRASOUND;   Surgeon: [name], MD;  Location: [facility] OR[facility];  Service: Cardiopulmonary;  Laterality: Right;  . ENDOBRONCHIAL ULTRASOUND Right 12/26/2018   Procedure: ENDOBRONCHIAL ULTRASOUND RIGHT;  Surgeon: [name], MD;  Location: [facility] OR[facility];  Service: Cardiopulmonary;  Laterality: Right;  . FLEXIBLE BRONCHOSCOPY N/A 08/20/2018   Procedure: FLEXIBLE BRONCHOSCOPY PREOP;  Surgeon: [name], MD;  Location: [facility] OR[facility];  Service: Thoracic;  Laterality: N/A;  . IR CV LINE INJECTION  04/04/2019  . NASAL SINUS SURGERY  10/2017   At [facility], frontal sinusotomy, ethmoidectomy, resection anterior cranial fossa neoplasm, turbinate resection  . PORTACATH PLACEMENT Left 01/15/2019   Procedure: INSERTION PORT-A-CATH;  Surgeon: [name], MD;  Location: [facility] OR[facility];  Service: General;  Laterality: Left;  . THORACOTOMY Right 08/13/2018   Procedure: PREOP BROCHOSCOPY WITH RIGHT THORACOTOMY AND RUL RESECTION;  Surgeon: [name], MD;  Location: [facility] OR[facility];  Service: General;  Laterality: Right;  . THORACOTOMY Right 08/20/2018   Procedure: THORACOTOMY MAJOR RIGHT UPPER LOBE LOBECTOMY;  Surgeon: [name], MD;  Location: [facility] OR[facility];  Service: Thoracic;  Laterality: Right;  . TOE SURGERY Left    pin in left toe    SOCIAL HISTORY: Social History   Socioeconomic History  . Marital status: Married    Spouse name: lisa  . Number of children: Not on file  . Years of education: Not on file  . Highest education level: Not on file  Occupational History  . Occupation: welding    Comment: taking time off to resolve issues  Tobacco Use  . Smoking status: Former Smoker    Packs/day: 0.50    Years: 15.00    Pack years: 7.50    Types: Cigarettes    Quit date: 08/2018    Years since quitting: 0.9  . Smokeless tobacco: Never Used  Substance and Sexual Activity  . Alcohol use: Yes    Alcohol/week: 3.0 standard drinks    Types: 3 Cans of beer per week    Comment: usually 2 drinks per week, per patient  . Drug use: No  .  Sexual activity: Not on file  Other Topics Concern  . Not on file  Social History Narrative  . Not on file   Social Determinants of Health   Financial Resource Strain:   . Difficulty of Paying Living Expenses: Not on file  Food Insecurity:   . Worried About Charity fundraiser in the Last Year: Not on file  . Ran Out of Food in the Last Year: Not on file  Transportation Needs:   . Lack of Transportation (Medical): Not on file  . Lack of Transportation (Non-Medical): Not on file  Physical Activity:   . Days of Exercise per Week: Not on file  . Minutes of Exercise per Session: Not on file  Stress:   . Feeling of Stress : Not on file  Social Connections:   . Frequency of Communication with Friends and Family: Not on file  . Frequency of Social Gatherings with Friends and Family: Not on file  . Attends Religious Services: Not on file  . Active Member of Clubs or Organizations: Not on file  . Attends Archivist Meetings: Not on file  . Marital Status: Not on file  Intimate Partner Violence:   . Fear of Current or Ex-Partner: Not on file  . Emotionally Abused: Not on file  . Physically Abused: Not on file  . Sexually Abused: Not on file  FAMILY HISTORY: Family History  Problem Relation Age of Onset  . Breast cancer Mother   . Diabetes Mother   . Lung cancer Father   . Hypertension Father     ALLERGIES:  has No Known Allergies.  MEDICATIONS:  Current Outpatient Medications  Medication Sig Dispense Refill  . albuterol (VENTOLIN HFA) 108 (90 Base) MCG/ACT inhaler Inhale 2 puffs into the lungs every 6 (six) hours as needed for wheezing or shortness of breath. 8 g 6  . cloNIDine (CATAPRES) 0.1 MG tablet Take 0.1 mg by mouth daily.   5  . diltiazem (CARDIZEM CD) 120 MG 24 hr capsule Take 120 mg by mouth daily.    . folic acid (V-R FOLIC ACID) 676 MCG tablet Take 1 tablet (400 mcg total) by mouth daily. 90 tablet 1  . hydrALAZINE (APRESOLINE) 100 MG tablet Take 50  mg by mouth 2 (two) times daily.     . hydrochlorothiazide (HYDRODIURIL) 12.5 MG tablet Take 12.5 mg by mouth daily.     Marland Kitchen KLOR-CON M20 20 MEQ tablet Take 20 mEq by mouth 2 (two) times daily.    Marland Kitchen lidocaine-prilocaine (EMLA) cream Apply to affected area once 30 g 3  . magnesium chloride (SLOW-MAG) 64 MG TBEC SR tablet Take 1 tablet (64 mg total) by mouth daily. 30 tablet 1  . methocarbamol (ROBAXIN) 500 MG tablet Take 1 tablet (500 mg total) by mouth at bedtime. 30 tablet 0  . olmesartan (BENICAR) 40 MG tablet Take 40 mg by mouth every other day.     . ondansetron (ZOFRAN) 8 MG tablet Take 1 tablet (8 mg total) by mouth 2 (two) times daily as needed for refractory nausea / vomiting. Start on day 3 after chemo. 30 tablet 1  . oxyCODONE-acetaminophen (PERCOCET) 5-325 MG tablet Take 1-2 tablets by mouth every 4 (four) hours as needed for severe pain. 20 tablet 0  . prochlorperazine (COMPAZINE) 10 MG tablet Take 1 tablet (10 mg total) by mouth every 6 (six) hours as needed (Nausea or vomiting). 30 tablet 1  . vitamin B-12 (CYANOCOBALAMIN) 1000 MCG tablet Take 1 tablet (1,000 mcg total) by mouth daily. 90 tablet 1   No current facility-administered medications for this visit.   Facility-Administered Medications Ordered in Other Visits  Medication Dose Route Frequency Provider Last Rate Last Admin  . albuterol (PROVENTIL) (2.5 MG/3ML) 0.083% nebulizer solution 2.5 mg  2.5 mg Nebulization Once System, Provider Not In      . heparin lock flush 100 unit/mL  500 Units Intravenous Once Earlie Server, MD      . sodium chloride flush (NS) 0.9 % injection 10 mL  10 mL Intravenous PRN Earlie Server, MD   10 mL at 04/04/19 0903     PHYSICAL EXAMINATION: ECOG PERFORMANCE STATUS: 0 - Asymptomatic Vitals:   07/10/19 0921 07/10/19 0944  BP: (!) 175/102 (!) 171/107  Pulse: 98   Resp: 18   Temp: (!) 96.7 F (35.9 C)   SpO2: 96%    Filed Weights   07/10/19 0921  Weight: 149 lb (67.6 kg)    Physical  Exam Constitutional:      General: He is not in acute distress.    Comments: Thin  HENT:     Head: Normocephalic and atraumatic.  Eyes:     General: No scleral icterus.    Pupils: Pupils are equal, round, and reactive to light.  Cardiovascular:     Rate and Rhythm: Normal rate and regular rhythm.  Heart sounds: Normal heart sounds.  Pulmonary:     Effort: Pulmonary effort is normal. No respiratory distress.     Breath sounds: No wheezing.  Abdominal:     General: Bowel sounds are normal. There is no distension.     Palpations: Abdomen is soft. There is no mass.     Tenderness: There is no abdominal tenderness.  Musculoskeletal:        General: No deformity. Normal range of motion.     Cervical back: Normal range of motion and neck supple.  Skin:    General: Skin is warm and dry.     Findings: No erythema or rash.  Neurological:     Mental Status: He is alert and oriented to person, place, and time.     Cranial Nerves: No cranial nerve deficit.     Coordination: Coordination normal.  Psychiatric:        Behavior: Behavior normal.        Thought Content: Thought content normal.      LABORATORY DATA:  I have reviewed the data as listed Lab Results  Component Value Date   WBC 7.4 07/10/2019   HGB 13.7 07/10/2019   HCT 40.4 07/10/2019   MCV 109.5 (H) 07/10/2019   PLT 146 (L) 07/10/2019   Recent Labs    06/12/19 0903 06/26/19 0810 07/10/19 0918  NA 139 144 143  K 3.5 3.6 3.4*  CL 104 108 106  CO2 _0 GLUCOSE 104* 114* 84  BUN _1 CREATININE 0.61 0.80 0.76  CALCIUM 9.2 9.0 9.3  GFRNONAA >60 >60 >60  GFRAA >60 >60 >60  PROT 7.4 7.1 7.5  ALBUMIN 3.8 3.6 3.7  AST 32 38 45*  ALT _2 ALKPHOS 77 90 75  BILITOT 0.9 0.8 0.4   Iron/TIBC/Ferritin/ %Sat No results found for: IRON, TIBC, FERRITIN, IRONPCTSAT   Pathology 12/26/2018 Right hilar adenopathy/mass-EUS guided FNA Positive for malignancy, non small cell carcinoma, morphologically  similar to previous right upper lobe squamous cell carcinoma.   ASSESSMENT & PLAN:  1. Squamous cell carcinoma of lung, right ([place])   2. Port-A-Cath in place   3. Hypomagnesemia   4. B12 deficiency   5. Encounter for antineoplastic immunotherapy   6. Folate deficiency   7. Uncontrolled hypertension    #Recurrent Squamous cell carcinoma of lung,  Patient tolerates immunotherapy maintenance well. Labs are reviewed and discussed with patient Counts are stable.  #Port-A-Cath in place, Mediport has no blood return.  Patient will get that study today. Spoke with radiology.  Partially occlusive fibrin sheath around the distal tip of the port catheter in the SVC.  Port is safe for routine use.  Patient will receive Cathflow dwell  #Patient will receive durvalumab on 07/13/2018.  #Vitamin B12 deficiency, continue oral vitamin B12 supplementation. # Folic deficiency, continue folic acid supplementation.  #Thrombocytopenia, platelet count is at 1 46,000.  Stable.  Continue to monitor. Tachycardia has resolved. High blood pressure, recommend patient continue follow-up with primary care provider and cardiologist for tighter BP control. Hypomagnesium, resolved.  Recommend continue Slow-Mag once daily.  Return of visit: 2 weeks.    Earlie Server, MD, PhD Hematology Oncology [facility] technician smear is a possible [facility] 07/10/2019

## 2019-07-14 ENCOUNTER — Inpatient Hospital Stay: Payer: Managed Care, Other (non HMO) | Attending: Oncology

## 2019-07-14 ENCOUNTER — Other Ambulatory Visit: Payer: Self-pay

## 2019-07-14 ENCOUNTER — Inpatient Hospital Stay: Payer: Managed Care, Other (non HMO)

## 2019-07-14 VITALS — BP 157/94 | HR 92

## 2019-07-14 DIAGNOSIS — D696 Thrombocytopenia, unspecified: Secondary | ICD-10-CM | POA: Insufficient documentation

## 2019-07-14 DIAGNOSIS — I10 Essential (primary) hypertension: Secondary | ICD-10-CM | POA: Insufficient documentation

## 2019-07-14 DIAGNOSIS — Z87891 Personal history of nicotine dependence: Secondary | ICD-10-CM | POA: Diagnosis not present

## 2019-07-14 DIAGNOSIS — Z5112 Encounter for antineoplastic immunotherapy: Secondary | ICD-10-CM | POA: Diagnosis not present

## 2019-07-14 DIAGNOSIS — Z833 Family history of diabetes mellitus: Secondary | ICD-10-CM | POA: Insufficient documentation

## 2019-07-14 DIAGNOSIS — D7589 Other specified diseases of blood and blood-forming organs: Secondary | ICD-10-CM | POA: Diagnosis not present

## 2019-07-14 DIAGNOSIS — Z803 Family history of malignant neoplasm of breast: Secondary | ICD-10-CM | POA: Insufficient documentation

## 2019-07-14 DIAGNOSIS — Z8249 Family history of ischemic heart disease and other diseases of the circulatory system: Secondary | ICD-10-CM | POA: Insufficient documentation

## 2019-07-14 DIAGNOSIS — C3411 Malignant neoplasm of upper lobe, right bronchus or lung: Secondary | ICD-10-CM | POA: Diagnosis present

## 2019-07-14 DIAGNOSIS — Z801 Family history of malignant neoplasm of trachea, bronchus and lung: Secondary | ICD-10-CM | POA: Diagnosis not present

## 2019-07-14 DIAGNOSIS — C3491 Malignant neoplasm of unspecified part of right bronchus or lung: Secondary | ICD-10-CM

## 2019-07-14 DIAGNOSIS — Z95828 Presence of other vascular implants and grafts: Secondary | ICD-10-CM

## 2019-07-14 DIAGNOSIS — Z79899 Other long term (current) drug therapy: Secondary | ICD-10-CM | POA: Diagnosis not present

## 2019-07-14 DIAGNOSIS — E538 Deficiency of other specified B group vitamins: Secondary | ICD-10-CM | POA: Diagnosis not present

## 2019-07-14 LAB — COMPREHENSIVE METABOLIC PANEL
ALT: 20 U/L (ref 0–44)
AST: 26 U/L (ref 15–41)
Albumin: 3.6 g/dL (ref 3.5–5.0)
Alkaline Phosphatase: 75 U/L (ref 38–126)
Anion gap: 9 (ref 5–15)
BUN: 10 mg/dL (ref 6–20)
CO2: 24 mmol/L (ref 22–32)
Calcium: 9.2 mg/dL (ref 8.9–10.3)
Chloride: 108 mmol/L (ref 98–111)
Creatinine, Ser: 0.87 mg/dL (ref 0.61–1.24)
GFR calc Af Amer: >60 mL/min (ref >60)
GFR calc non Af Amer: >60 mL/min (ref >60)
Glucose, Bld: 88 mg/dL (ref 70–99)
Potassium: 3.8 mmol/L (ref 3.5–5.1)
Sodium: 141 mmol/L (ref 135–145)
Total Bilirubin: 0.3 mg/dL (ref 0.3–1.2)
Total Protein: 7.3 g/dL (ref 6.5–8.1)

## 2019-07-14 LAB — CBC WITH DIFFERENTIAL/PLATELET
Abs Immature Granulocytes: 0.03 10*3/uL (ref 0.00–0.07)
Basophils Absolute: 0.1 10*3/uL (ref 0.0–0.1)
Basophils Relative: 1 %
Eosinophils Absolute: 0.1 10*3/uL (ref 0.0–0.5)
Eosinophils Relative: 1 %
HCT: 41.7 % (ref 39.0–52.0)
Hemoglobin: 14 g/dL (ref 13.0–17.0)
Immature Granulocytes: 0 %
Lymphocytes Relative: 9 %
Lymphs Abs: 0.9 10*3/uL (ref 0.7–4.0)
MCH: 36.5 pg — ABNORMAL HIGH (ref 26.0–34.0)
MCHC: 33.6 g/dL (ref 30.0–36.0)
MCV: 108.6 fL — ABNORMAL HIGH (ref 80.0–100.0)
Monocytes Absolute: 1 10*3/uL (ref 0.1–1.0)
Monocytes Relative: 10 %
Neutro Abs: 7.9 10*3/uL — ABNORMAL HIGH (ref 1.7–7.7)
Neutrophils Relative %: 79 %
Platelets: 158 10*3/uL (ref 150–400)
RBC: 3.84 MIL/uL — ABNORMAL LOW (ref 4.22–5.81)
RDW: 12.2 % (ref 11.5–15.5)
WBC: 9.9 10*3/uL (ref 4.0–10.5)
nRBC: 0 % (ref 0.0–0.2)

## 2019-07-14 LAB — MAGNESIUM: Magnesium: 1.7 mg/dL (ref 1.7–2.4)

## 2019-07-14 MED ORDER — HEPARIN SOD (PORK) LOCK FLUSH 100 UNIT/ML IV SOLN
500.0000 [IU] | Freq: Once | INTRAVENOUS | Status: DC | PRN
  Filled 2019-07-14: qty 5

## 2019-07-14 MED ORDER — SODIUM CHLORIDE 0.9% FLUSH
10.0000 mL | Freq: Once | INTRAVENOUS | Status: AC
  Administered 2019-07-14: 09:00:00 10 mL via INTRAVENOUS
  Filled 2019-07-14: qty 10

## 2019-07-14 MED ORDER — HEPARIN SOD (PORK) LOCK FLUSH 100 UNIT/ML IV SOLN
500.0000 [IU] | Freq: Once | INTRAVENOUS | Status: AC
  Administered 2019-07-14: 11:00:00 500 [IU] via INTRAVENOUS
  Filled 2019-07-14: qty 5

## 2019-07-14 MED ORDER — SODIUM CHLORIDE 0.9 % IV SOLN
620.0000 mg | Freq: Once | INTRAVENOUS | Status: DC
  Filled 2019-07-14: qty 12.4

## 2019-07-14 MED ORDER — SODIUM CHLORIDE 0.9 % IV SOLN
Freq: Once | INTRAVENOUS | Status: AC
  Filled 2019-07-14: qty 250

## 2019-07-14 NOTE — Progress Notes (Signed)
Pt states he has gotten new insurance, but has not received the insurance card in the mail at this time. Per LuAnn unable to get medication pre-approval due to not having insurance information. LuAnn's number given to pt, pt to contact [facility] with insurance information so that pre-approval can be obtained, and pt will then be rescheduled for treatment.  MD, pharmacy and pt aware. Pt verbalizes understanding. Pt stable at discharge.

## 2019-07-18 ENCOUNTER — Other Ambulatory Visit: Payer: Self-pay | Admitting: Oncology

## 2019-07-21 ENCOUNTER — Other Ambulatory Visit: Payer: Self-pay

## 2019-07-21 ENCOUNTER — Other Ambulatory Visit: Payer: Self-pay | Admitting: Oncology

## 2019-07-21 MED ORDER — SLOW-MAG 71.5-119 MG PO TBEC: DELAYED_RELEASE_TABLET | ORAL | 0 refills | Status: DC

## 2019-07-23 DIAGNOSIS — Z87891 Personal history of nicotine dependence: Secondary | ICD-10-CM | POA: Insufficient documentation

## 2019-07-28 ENCOUNTER — Inpatient Hospital Stay: Payer: Managed Care, Other (non HMO) | Admitting: Oncology

## 2019-07-28 ENCOUNTER — Inpatient Hospital Stay: Payer: Managed Care, Other (non HMO)

## 2019-07-29 ENCOUNTER — Ambulatory Visit: Admission: RE | Admit: 2019-07-29 | Payer: Managed Care, Other (non HMO) | Source: Ambulatory Visit

## 2019-08-04 ENCOUNTER — Other Ambulatory Visit: Payer: Self-pay

## 2019-08-04 ENCOUNTER — Encounter: Payer: Self-pay | Admitting: Oncology

## 2019-08-04 ENCOUNTER — Inpatient Hospital Stay (HOSPITAL_BASED_OUTPATIENT_CLINIC_OR_DEPARTMENT_OTHER): Payer: Managed Care, Other (non HMO) | Admitting: Oncology

## 2019-08-04 ENCOUNTER — Inpatient Hospital Stay: Payer: Managed Care, Other (non HMO)

## 2019-08-04 VITALS — BP 129/82 | HR 93 | Temp 98.7°F | Resp 16 | Wt 145.5 lb

## 2019-08-04 DIAGNOSIS — C3491 Malignant neoplasm of unspecified part of right bronchus or lung: Secondary | ICD-10-CM

## 2019-08-04 DIAGNOSIS — D696 Thrombocytopenia, unspecified: Secondary | ICD-10-CM

## 2019-08-04 DIAGNOSIS — Z95828 Presence of other vascular implants and grafts: Secondary | ICD-10-CM

## 2019-08-04 DIAGNOSIS — D7589 Other specified diseases of blood and blood-forming organs: Secondary | ICD-10-CM | POA: Diagnosis not present

## 2019-08-04 DIAGNOSIS — E538 Deficiency of other specified B group vitamins: Secondary | ICD-10-CM

## 2019-08-04 DIAGNOSIS — E860 Dehydration: Secondary | ICD-10-CM

## 2019-08-04 DIAGNOSIS — Z5112 Encounter for antineoplastic immunotherapy: Secondary | ICD-10-CM | POA: Diagnosis not present

## 2019-08-04 LAB — COMPREHENSIVE METABOLIC PANEL
ALT: 28 U/L (ref 0–44)
AST: 30 U/L (ref 15–41)
Albumin: 3.7 g/dL (ref 3.5–5.0)
Alkaline Phosphatase: 82 U/L (ref 38–126)
Anion gap: 11 (ref 5–15)
BUN: 13 mg/dL (ref 6–20)
CO2: 22 mmol/L (ref 22–32)
Calcium: 9.4 mg/dL (ref 8.9–10.3)
Chloride: 105 mmol/L (ref 98–111)
Creatinine, Ser: 1.01 mg/dL (ref 0.61–1.24)
GFR calc Af Amer: >60 mL/min (ref >60)
GFR calc non Af Amer: >60 mL/min (ref >60)
Glucose, Bld: 106 mg/dL — ABNORMAL HIGH (ref 70–99)
Potassium: 3.9 mmol/L (ref 3.5–5.1)
Sodium: 138 mmol/L (ref 135–145)
Total Bilirubin: 0.4 mg/dL (ref 0.3–1.2)
Total Protein: 7.3 g/dL (ref 6.5–8.1)

## 2019-08-04 LAB — CBC WITH DIFFERENTIAL/PLATELET
Abs Immature Granulocytes: 0.02 10*3/uL (ref 0.00–0.07)
Basophils Absolute: 0.1 10*3/uL (ref 0.0–0.1)
Basophils Relative: 1 %
Eosinophils Absolute: 0.1 10*3/uL (ref 0.0–0.5)
Eosinophils Relative: 1 %
HCT: 42.8 % (ref 39.0–52.0)
Hemoglobin: 14.6 g/dL (ref 13.0–17.0)
Immature Granulocytes: 0 %
Lymphocytes Relative: 10 %
Lymphs Abs: 1.1 10*3/uL (ref 0.7–4.0)
MCH: 36.2 pg — ABNORMAL HIGH (ref 26.0–34.0)
MCHC: 34.1 g/dL (ref 30.0–36.0)
MCV: 106.2 fL — ABNORMAL HIGH (ref 80.0–100.0)
Monocytes Absolute: 1 10*3/uL (ref 0.1–1.0)
Monocytes Relative: 10 %
Neutro Abs: 8.2 10*3/uL — ABNORMAL HIGH (ref 1.7–7.7)
Neutrophils Relative %: 78 %
Platelets: 140 10*3/uL — ABNORMAL LOW (ref 150–400)
RBC: 4.03 MIL/uL — ABNORMAL LOW (ref 4.22–5.81)
RDW: 11.9 % (ref 11.5–15.5)
WBC: 10.5 10*3/uL (ref 4.0–10.5)
nRBC: 0 % (ref 0.0–0.2)

## 2019-08-04 LAB — MAGNESIUM: Magnesium: 1.6 mg/dL — ABNORMAL LOW (ref 1.7–2.4)

## 2019-08-04 MED ORDER — HEPARIN SOD (PORK) LOCK FLUSH 100 UNIT/ML IV SOLN
500.0000 [IU] | Freq: Once | INTRAVENOUS | Status: AC | PRN
  Administered 2019-08-04: 500 [IU]
  Filled 2019-08-04: qty 5

## 2019-08-04 MED ORDER — HEPARIN SOD (PORK) LOCK FLUSH 100 UNIT/ML IV SOLN
INTRAVENOUS | Status: AC
  Filled 2019-08-04: qty 5

## 2019-08-04 MED ORDER — SODIUM CHLORIDE 0.9 % IV SOLN
Freq: Once | INTRAVENOUS | Status: AC
  Filled 2019-08-04: qty 250

## 2019-08-04 MED ORDER — SODIUM CHLORIDE 0.9 % IV SOLN: 2.0000 g | Freq: Once | INTRAVENOUS | Status: DC

## 2019-08-04 MED ORDER — SODIUM CHLORIDE 0.9 % IV SOLN
9.7000 mg/kg | Freq: Once | INTRAVENOUS | Status: AC
  Administered 2019-08-04: 620 mg via INTRAVENOUS
  Filled 2019-08-04: qty 10

## 2019-08-04 MED ORDER — MAGNESIUM SULFATE 2 GM/50ML IV SOLN
2.0000 g | Freq: Once | INTRAVENOUS | Status: AC
  Administered 2019-08-04: 2 g via INTRAVENOUS
  Filled 2019-08-04: qty 50

## 2019-08-04 MED ORDER — SODIUM CHLORIDE 0.9% FLUSH
10.0000 mL | Freq: Once | INTRAVENOUS | Status: AC
  Administered 2019-08-04: 10 mL via INTRAVENOUS
  Filled 2019-08-04: qty 10

## 2019-08-04 MED ORDER — VITAMIN B-12 1000 MCG PO TABS: 1000.0000 ug | ORAL_TABLET | Freq: Every day | ORAL | 1 refills | Status: DC

## 2019-08-04 NOTE — Progress Notes (Signed)
Patient does not offer any problems today.  

## 2019-08-04 NOTE — Progress Notes (Signed)
Hematology/Oncology Follow up note [facility] Telephone:([phone]) [phone] [phone]) [phone]   Patient Care Team: Tracie Harrier, MD as PCP - General (Internal Medicine)  REASON FOR VISIT:  Follow up for treatment of squamous lung cancer.   HISTORY OF PRESENTING ILLNESS:  # Dec 2019 Stage I squamous lung cancer #s/p Bronchoscopy on 06/25/2018. subcarina EBUS FNA was non diagnostic, hypocellular specimen.  # 08/13/2018 CT guided right upper lobe biopsy pathology showed dense fibrosis and mixed inflammatory cells with prominent polytypic plasma cells component. Focal benign bronchial wall and alveolar spaces. No malignancy was identified.   # 08/13/2018 underwent right thoracotomy and wedge resection of a right upper lobe mass.  Frozen section was consistent with an inflammatory process.  On the second postop day, preliminary pathology reports high-grade malignancy.  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  08/20/2018 Patient was therefore taken back to the OR and underwent take complete lobectomy  pT1b pN0 cM0 stage I squamous cell lung cancer.  Margin is negative.  Recommend observation.    # 12/03/2018 CT chest w contrast showed local recurrence.  12/10/2018 PET showed No evidence of distant metastatic disease # 12/26/2018 s/p bronchoscopy biopsy. Confirmed local recurrence of squamous cell lung cancer.   medi port placed by Dr.Oaks.  Cancer treatment Started concurrent chemoradiation on 01/16/2019 Carboplatin AUC of 2 and Taxol 45 mg/m2 weekly finished in 03/05/2019.   INTERVAL HISTORY Samuel Choi is a 54 y.o. male who has above history reviewed by me today presents for evaluation prior to immunotherapy for treatment of  recurrent  squamous cell lung cancer. Patient reports feeling well today..  Currently on immunotherapy maintenance with durvalumab. He has no new complaints. Patient was seen by Dr. Ginette Pitman on 07/23/2019.  notes were reviewed.  HCTZ was  discontinued.   Patient is currently on Olmesartan, amlodipine, hydralazine, Clonidine.    Review of Systems  Constitutional: Negative for appetite change, chills, fatigue, fever and unexpected weight change.  HENT:   Negative for hearing loss and voice change.   Eyes: Negative for eye problems and icterus.  Respiratory: Negative for chest tightness, cough and shortness of breath.   Cardiovascular: Negative for chest pain and leg swelling.  Gastrointestinal: Negative for abdominal distention, abdominal pain and diarrhea.  Endocrine: Negative for hot flashes.  Genitourinary: Negative for difficulty urinating, dysuria and frequency.   Musculoskeletal: Negative for arthralgias.  Skin: Negative for itching and rash.  Neurological: Negative for light-headedness and numbness.  Hematological: Negative for adenopathy. Does not bruise/bleed easily.  Psychiatric/Behavioral: Negative for confusion.    MEDICAL HISTORY:  Past Medical History:  Diagnosis Date  . Alcohol abuse    usually drinks 2-3 drinks per day  . Atherosclerosis 06/2018  . Chronic sinusitis   . Dehydration 02/07/2019  . Emphysema of lung ([place]) 06/2018   patient unaware of this.  . Hip fracture ([place]) 05/2018   no surgery  . History of kidney stones 05/2018   per xray, bilateral nephrolitiasis  . Hypertension   . Squamous cell carcinoma of lung, right ([place]) 06/2018    SURGICAL HISTORY: Past Surgical History:  Procedure Laterality Date  . BRAIN SURGERY  10/2017   nasal/sinus endoscopy. mass benign  . ELECTROMAGNETIC NAVIGATION BROCHOSCOPY Right 06/25/2018   Procedure: ELECTROMAGNETIC NAVIGATION BRONCHOSCOPY;  Surgeon: [name], MD;  Location: [facility] ORS;  Service: Cardiopulmonary;  Laterality: Right;  . ENDOBRONCHIAL ULTRASOUND Right 06/25/2018   Procedure: ENDOBRONCHIAL ULTRASOUND;  Surgeon: [name], MD;  Location: [facility] OR[facility];  Service: Cardiopulmonary;  Laterality: Right;  . ENDOBRONCHIAL ULTRASOUND Right  12/26/2018   Procedure: ENDOBRONCHIAL ULTRASOUND RIGHT;  Surgeon: [name], MD;  Location: [facility] ORS;  Service: Cardiopulmonary;  Laterality: Right;  . FLEXIBLE BRONCHOSCOPY N/A 08/20/2018   Procedure: FLEXIBLE BRONCHOSCOPY PREOP;  Surgeon: [name], MD;  Location: [facility] ORS;  Service: Thoracic;  Laterality: N/A;  . IR CV LINE INJECTION  04/04/2019  . NASAL SINUS SURGERY  10/2017   At [facility], frontal sinusotomy, ethmoidectomy, resection anterior cranial fossa neoplasm, turbinate resection  . PORTACATH PLACEMENT Left 01/15/2019   Procedure: INSERTION PORT-A-CATH;  Surgeon: [name], MD;  Location: [facility] ORS;  Service: General;  Laterality: Left;  . THORACOTOMY Right 08/13/2018   Procedure: PREOP BROCHOSCOPY WITH RIGHT THORACOTOMY AND RUL RESECTION;  Surgeon: [name], MD;  Location: [facility] OR[facility];  Service: General;  Laterality: Right;  . THORACOTOMY Right 08/20/2018   Procedure: THORACOTOMY MAJOR RIGHT UPPER LOBE LOBECTOMY;  Surgeon: [name], MD;  Location: [facility] OR[facility];  Service: Thoracic;  Laterality: Right;  . TOE SURGERY Left    pin in left toe    SOCIAL HISTORY: Social History   Socioeconomic History  . Marital status: Married    Spouse name: lisa  . Number of children: Not on file  . Years of education: Not on file  . Highest education level: Not on file  Occupational History  . Occupation: welding    Comment: taking time off to resolve issues  Tobacco Use  . Smoking status: Former Smoker    Packs/day: 0.50    Years: 15.00    Pack years: 7.50    Types: Cigarettes    Quit date: 08/2018    Years since quitting: 0.9  . Smokeless tobacco: Never Used  Substance and Sexual Activity  . Alcohol use: Yes    Alcohol/week: 3.0 standard drinks    Types: 3 Cans of beer per week    Comment: usually 2 drinks per week, per patient  . Drug use: No  . Sexual activity: Not on file  Other Topics Concern  . Not on file  Social History Narrative  . Not on file   Social  Determinants of Health   Financial Resource Strain:   . Difficulty of Paying Living Expenses: Not on file  Food Insecurity:   . Worried About Charity fundraiser in the Last Year: Not on file  . Ran Out of Food in the Last Year: Not on file  Transportation Needs:   . Lack of Transportation (Medical): Not on file  . Lack of Transportation (Non-Medical): Not on file  Physical Activity:   . Days of Exercise per Week: Not on file  . Minutes of Exercise per Session: Not on file  Stress:   . Feeling of Stress : Not on file  Social Connections:   . Frequency of Communication with Friends and Family: Not on file  . Frequency of Social Gatherings with Friends and Family: Not on file  . Attends Religious Services: Not on file  . Active Member of Clubs or Organizations: Not on file  . Attends Archivist Meetings: Not on file  . Marital Status: Not on file  Intimate Partner Violence:   . Fear of Current or Ex-Partner: Not on file  . Emotionally Abused: Not on file  . Physically Abused: Not on file  . Sexually Abused: Not on file    FAMILY HISTORY: Family History  Problem Relation Age of Onset  . Breast cancer Mother   . Diabetes  Mother   . Lung cancer Father   . Hypertension Father     ALLERGIES:  has No Known Allergies.  MEDICATIONS:  Current Outpatient Medications  Medication Sig Dispense Refill  . albuterol (VENTOLIN HFA) 108 (90 Base) MCG/ACT inhaler Inhale 2 puffs into the lungs every 6 (six) hours as needed for wheezing or shortness of breath. 8 g 6  . cloNIDine (CATAPRES) 0.1 MG tablet Take 0.1 mg by mouth daily.   5  . diltiazem (CARDIZEM CD) 120 MG 24 hr capsule Take 120 mg by mouth daily.    . folic acid (V-R FOLIC ACID) 938 MCG tablet Take 1 tablet (400 mcg total) by mouth daily. 90 tablet 1  . hydrALAZINE (APRESOLINE) 100 MG tablet Take 50 mg by mouth 2 (two) times daily.     Marland Kitchen KLOR-CON M20 20 MEQ tablet Take 20 mEq by mouth 2 (two) times daily.    Marland Kitchen  lidocaine-prilocaine (EMLA) cream Apply to affected area once 30 g 3  . magnesium chloride (SLOW-MAG) 64 MG TBEC SR tablet TAKE 1 TABLET (64 MG TOTAL) BY MOUTH DAILY. 90 tablet 0  . methocarbamol (ROBAXIN) 500 MG tablet Take 1 tablet (500 mg total) by mouth at bedtime. 30 tablet 0  . olmesartan (BENICAR) 40 MG tablet Take 40 mg by mouth every other day.     . ondansetron (ZOFRAN) 8 MG tablet Take 1 tablet (8 mg total) by mouth 2 (two) times daily as needed for refractory nausea / vomiting. Start on day 3 after chemo. 30 tablet 1  . oxyCODONE-acetaminophen (PERCOCET) 5-325 MG tablet Take 1-2 tablets by mouth every 4 (four) hours as needed for severe pain. 20 tablet 0  . prochlorperazine (COMPAZINE) 10 MG tablet Take 1 tablet (10 mg total) by mouth every 6 (six) hours as needed (Nausea or vomiting). 30 tablet 1  . vitamin B-12 (CYANOCOBALAMIN) 1000 MCG tablet Take 1 tablet (1,000 mcg total) by mouth daily. 90 tablet 1  . hydrochlorothiazide (HYDRODIURIL) 12.5 MG tablet Take 12.5 mg by mouth daily.      No current facility-administered medications for this visit.   Facility-Administered Medications Ordered in Other Visits  Medication Dose Route Frequency Provider Last Rate Last Admin  . albuterol (PROVENTIL) (2.5 MG/3ML) 0.083% nebulizer solution 2.5 mg  2.5 mg Nebulization Once System, Provider Not In      . heparin lock flush 100 unit/mL  500 Units Intravenous Once Earlie Server, MD      . sodium chloride flush (NS) 0.9 % injection 10 mL  10 mL Intravenous PRN Earlie Server, MD   10 mL at 04/04/19 0903     PHYSICAL EXAMINATION: ECOG PERFORMANCE STATUS: 0 - Asymptomatic Vitals:   08/04/19 0926  BP: 129/82  Pulse: 93  Resp: 16  Temp: 98.7 F (37.1 C)   Filed Weights   08/04/19 0926  Weight: 145 lb 8 oz (66 kg)    Physical Exam Constitutional:      General: He is not in acute distress.    Comments: Thin  HENT:     Head: Normocephalic and atraumatic.  Eyes:     General: No scleral  icterus.    Pupils: Pupils are equal, round, and reactive to light.  Cardiovascular:     Rate and Rhythm: Normal rate and regular rhythm.     Heart sounds: Normal heart sounds.  Pulmonary:     Effort: Pulmonary effort is normal. No respiratory distress.     Breath sounds: No wheezing.  Abdominal:  General: Bowel sounds are normal. There is no distension.     Palpations: Abdomen is soft. There is no mass.     Tenderness: There is no abdominal tenderness.  Musculoskeletal:        General: No deformity. Normal range of motion.     Cervical back: Normal range of motion and neck supple.  Skin:    General: Skin is warm and dry.     Findings: No erythema or rash.  Neurological:     Mental Status: He is alert and oriented to person, place, and time.     Cranial Nerves: No cranial nerve deficit.     Coordination: Coordination normal.  Psychiatric:        Behavior: Behavior normal.        Thought Content: Thought content normal.      LABORATORY DATA:  I have reviewed the data as listed Lab Results  Component Value Date   WBC 10.5 08/04/2019   HGB 14.6 08/04/2019   HCT 42.8 08/04/2019   MCV 106.2 (H) 08/04/2019   PLT 140 (L) 08/04/2019   Recent Labs    07/10/19 0918 07/14/19 0914 08/04/19 0909  NA 143 141 138  K 3.4* 3.8 3.9  CL 106 108 105  CO2 _0 GLUCOSE 84 88 106*  BUN _1 CREATININE 0.76 0.87 1.01  CALCIUM 9.3 9.2 9.4  GFRNONAA >60 >60 >60  GFRAA >60 >60 >60  PROT 7.5 7.3 7.3  ALBUMIN 3.7 3.6 3.7  AST 45* 26 30  ALT _2 ALKPHOS 75 75 82  BILITOT 0.4 0.3 0.4   Iron/TIBC/Ferritin/ %Sat No results found for: IRON, TIBC, FERRITIN, IRONPCTSAT   Pathology 12/26/2018 Right hilar adenopathy/mass-EUS guided FNA Positive for malignancy, non small cell carcinoma, morphologically similar to previous right upper lobe squamous cell carcinoma.   ASSESSMENT & PLAN:  1. Squamous cell carcinoma of lung, right ([place])   2. Hypomagnesemia   3. Encounter  for antineoplastic immunotherapy    #Recurrent Squamous cell carcinoma of lung,  Patient tolerates immunotherapy maintenance well. Labs are reviewed and discussed with patient. Counts are stable.  Proceed with durvalumab today. Patient has follow-up surveillance CT scan scheduled on 08/12/2019.  #Macrocytosis, Vitamin B12 deficiency, continue oral vitamin B12 supplementation. # Folic deficiency, continue folic acid supplementation.  #Thrombocytopenia, platelet count is at 1 40,000.  Stable.  Continue to monitor.Marland Kitchen    Hypomagnesium, magnesium is decreased at 1.6 today.  Proceed with magnesium sulfate 2 g IV x1.  Continue Slow-Mag once daily.    Return of visit: 2 weeks.    Earlie Server, MD, PhD Hematology Oncology [facility] technician smear is a possible [facility] 08/04/2019

## 2019-08-07 ENCOUNTER — Ambulatory Visit: Payer: Managed Care, Other (non HMO) | Admitting: Radiation Oncology

## 2019-08-12 ENCOUNTER — Ambulatory Visit
Admission: RE | Admit: 2019-08-12 | Discharge: 2019-08-12 | Disposition: A | Discharge status: Home / Self Care | Payer: Managed Care, Other (non HMO) | Source: Ambulatory Visit | Attending: Radiation Oncology | Admitting: Radiation Oncology

## 2019-08-12 ENCOUNTER — Other Ambulatory Visit: Payer: Self-pay

## 2019-08-12 DIAGNOSIS — C3491 Malignant neoplasm of unspecified part of right bronchus or lung: Secondary | ICD-10-CM | POA: Insufficient documentation

## 2019-08-12 MED ORDER — IOHEXOL 300 MG/ML  SOLN
75.0000 mL | Freq: Once | INTRAMUSCULAR | Status: AC | PRN
  Administered 2019-08-12: 08:00:00 75 mL via INTRAVENOUS

## 2019-08-14 ENCOUNTER — Encounter: Payer: Self-pay | Admitting: Radiation Oncology

## 2019-08-14 ENCOUNTER — Other Ambulatory Visit: Payer: Self-pay

## 2019-08-14 NOTE — Progress Notes (Signed)
Patient pre screened for office appointment, no questions or concerns today. Patient reminded of upcoming appointment time and date. 

## 2019-08-15 ENCOUNTER — Other Ambulatory Visit: Payer: Self-pay | Admitting: *Deleted

## 2019-08-15 ENCOUNTER — Ambulatory Visit
Admission: RE | Admit: 2019-08-15 | Discharge: 2019-08-15 | Disposition: A | Discharge status: Home / Self Care | Payer: Managed Care, Other (non HMO) | Source: Ambulatory Visit | Attending: Radiation Oncology | Admitting: Radiation Oncology

## 2019-08-15 ENCOUNTER — Other Ambulatory Visit: Payer: Self-pay

## 2019-08-15 VITALS — BP 140/85 | HR 107 | Temp 98.0°F | Wt 144.9 lb

## 2019-08-15 DIAGNOSIS — C3491 Malignant neoplasm of unspecified part of right bronchus or lung: Secondary | ICD-10-CM

## 2019-08-15 DIAGNOSIS — Z87891 Personal history of nicotine dependence: Secondary | ICD-10-CM | POA: Diagnosis not present

## 2019-08-15 DIAGNOSIS — C3411 Malignant neoplasm of upper lobe, right bronchus or lung: Secondary | ICD-10-CM | POA: Diagnosis not present

## 2019-08-15 DIAGNOSIS — Z923 Personal history of irradiation: Secondary | ICD-10-CM | POA: Diagnosis not present

## 2019-08-15 DIAGNOSIS — Z9221 Personal history of antineoplastic chemotherapy: Secondary | ICD-10-CM | POA: Insufficient documentation

## 2019-08-15 NOTE — Progress Notes (Signed)
Radiation Oncology Follow up Note  Name: Samuel Choi   Date:   08/15/2019 MRN:  [id number] DOB: 05-Oct-1965    This 54 y.o. male presents to the clinic today for 38-month follow-up status post concurrent chemoradiation therapy for squamous cell carcinoma of the right upper lobe.  Initial stage I (T1b N0 M0) who developed hilar recurrence  REFERRING PROVIDER: Tracie Harrier, MD  HPI: Patient is a 54 year old male now out 5 months having completed salvage radiation therapy with concurrent chemotherapy to his right chest status post thoracotomy and wedge resection of the right upper lobe for a stage I squamous cell carcinoma.  Seen today in routine follow-up he is doing well.  He specifically denies cough hemoptysis or chest tightness.  He had a recent CT scan.  Showing stable right hilar soft tissue density consistent with treated tumor and radiation changes.  No other findings for advanced disease was noted.  Patient is currently onimmunotherapy maintenance with durvalumab.  Which she is tolerating well.  COMPLICATIONS OF TREATMENT: none  FOLLOW UP COMPLIANCE: keeps appointments   PHYSICAL EXAM:  BP 140/85 (BP Location: Right Arm, Patient Position: Sitting, Cuff Size: Small)   Pulse (!) 107   Temp 98 F (36.7 C)   Wt 144 lb 14.4 oz (65.7 kg)   BMI 19.12 kg/m  Well-developed well-nourished patient in NAD. HEENT reveals PERLA, EOMI, discs not visualized.  Oral cavity is clear. No oral mucosal lesions are identified. Neck is clear without evidence of cervical or supraclavicular adenopathy. Lungs are clear to A&P. Cardiac examination is essentially unremarkable with regular rate and rhythm without murmur rub or thrill. Abdomen is benign with no organomegaly or masses noted. Motor sensory and DTR levels are equal and symmetric in the upper and lower extremities. Cranial nerves II through XII are grossly intact. Proprioception is intact. No peripheral adenopathy or edema is identified. No motor or  sensory levels are noted. Crude visual fields are within normal range.  RADIOLOGY RESULTS: CT scans reviewed compatible with above-stated findings  PLAN: Present time patient is doing well has had an excellent response to radiation therapy he continues on immunotherapy under medical oncology's guidance.  I have asked to see him back in 6 months and will obtain a CT scan at that time if it is not already been performed.  Patient knows to call with any concerns.  I would like to take this opportunity to thank you for allowing me to participate in the care of your patient.Noreene Filbert, MD

## 2019-08-18 ENCOUNTER — Encounter: Payer: Self-pay | Admitting: Oncology

## 2019-08-18 ENCOUNTER — Inpatient Hospital Stay: Payer: Managed Care, Other (non HMO) | Attending: Oncology

## 2019-08-18 ENCOUNTER — Telehealth: Payer: Self-pay

## 2019-08-18 ENCOUNTER — Inpatient Hospital Stay: Payer: Managed Care, Other (non HMO)

## 2019-08-18 ENCOUNTER — Inpatient Hospital Stay (HOSPITAL_BASED_OUTPATIENT_CLINIC_OR_DEPARTMENT_OTHER): Payer: Managed Care, Other (non HMO) | Admitting: Oncology

## 2019-08-18 ENCOUNTER — Other Ambulatory Visit: Payer: Self-pay

## 2019-08-18 VITALS — BP 127/79 | HR 93 | Temp 98.4°F | Resp 16 | Wt 145.8 lb

## 2019-08-18 DIAGNOSIS — I10 Essential (primary) hypertension: Secondary | ICD-10-CM | POA: Insufficient documentation

## 2019-08-18 DIAGNOSIS — Z87891 Personal history of nicotine dependence: Secondary | ICD-10-CM | POA: Diagnosis not present

## 2019-08-18 DIAGNOSIS — Z803 Family history of malignant neoplasm of breast: Secondary | ICD-10-CM | POA: Diagnosis not present

## 2019-08-18 DIAGNOSIS — Z801 Family history of malignant neoplasm of trachea, bronchus and lung: Secondary | ICD-10-CM | POA: Insufficient documentation

## 2019-08-18 DIAGNOSIS — Z833 Family history of diabetes mellitus: Secondary | ICD-10-CM | POA: Insufficient documentation

## 2019-08-18 DIAGNOSIS — D7589 Other specified diseases of blood and blood-forming organs: Secondary | ICD-10-CM

## 2019-08-18 DIAGNOSIS — Z79899 Other long term (current) drug therapy: Secondary | ICD-10-CM | POA: Diagnosis not present

## 2019-08-18 DIAGNOSIS — Z5112 Encounter for antineoplastic immunotherapy: Secondary | ICD-10-CM | POA: Diagnosis not present

## 2019-08-18 DIAGNOSIS — C3411 Malignant neoplasm of upper lobe, right bronchus or lung: Secondary | ICD-10-CM | POA: Insufficient documentation

## 2019-08-18 DIAGNOSIS — Z8249 Family history of ischemic heart disease and other diseases of the circulatory system: Secondary | ICD-10-CM | POA: Diagnosis not present

## 2019-08-18 DIAGNOSIS — E538 Deficiency of other specified B group vitamins: Secondary | ICD-10-CM | POA: Diagnosis not present

## 2019-08-18 DIAGNOSIS — J439 Emphysema, unspecified: Secondary | ICD-10-CM | POA: Insufficient documentation

## 2019-08-18 DIAGNOSIS — D696 Thrombocytopenia, unspecified: Secondary | ICD-10-CM

## 2019-08-18 DIAGNOSIS — C3491 Malignant neoplasm of unspecified part of right bronchus or lung: Secondary | ICD-10-CM

## 2019-08-18 DIAGNOSIS — Z95828 Presence of other vascular implants and grafts: Secondary | ICD-10-CM

## 2019-08-18 LAB — CBC WITH DIFFERENTIAL/PLATELET
Abs Immature Granulocytes: 0.04 10*3/uL (ref 0.00–0.07)
Basophils Absolute: 0 10*3/uL (ref 0.0–0.1)
Basophils Relative: 0 %
Eosinophils Absolute: 0.1 10*3/uL (ref 0.0–0.5)
Eosinophils Relative: 1 %
HCT: 42.4 % (ref 39.0–52.0)
Hemoglobin: 14.5 g/dL (ref 13.0–17.0)
Immature Granulocytes: 0 %
Lymphocytes Relative: 8 %
Lymphs Abs: 0.8 10*3/uL (ref 0.7–4.0)
MCH: 36 pg — ABNORMAL HIGH (ref 26.0–34.0)
MCHC: 34.2 g/dL (ref 30.0–36.0)
MCV: 105.2 fL — ABNORMAL HIGH (ref 80.0–100.0)
Monocytes Absolute: 0.8 10*3/uL (ref 0.1–1.0)
Monocytes Relative: 8 %
Neutro Abs: 8 10*3/uL — ABNORMAL HIGH (ref 1.7–7.7)
Neutrophils Relative %: 83 %
Platelets: 143 10*3/uL — ABNORMAL LOW (ref 150–400)
RBC: 4.03 MIL/uL — ABNORMAL LOW (ref 4.22–5.81)
RDW: 11.6 % (ref 11.5–15.5)
WBC: 9.8 10*3/uL (ref 4.0–10.5)
nRBC: 0 % (ref 0.0–0.2)

## 2019-08-18 LAB — COMPREHENSIVE METABOLIC PANEL
ALT: 29 U/L (ref 0–44)
AST: 33 U/L (ref 15–41)
Albumin: 3.8 g/dL (ref 3.5–5.0)
Alkaline Phosphatase: 76 U/L (ref 38–126)
Anion gap: 10 (ref 5–15)
BUN: 16 mg/dL (ref 6–20)
CO2: 20 mmol/L — ABNORMAL LOW (ref 22–32)
Calcium: 9.4 mg/dL (ref 8.9–10.3)
Chloride: 105 mmol/L (ref 98–111)
Creatinine, Ser: 0.89 mg/dL (ref 0.61–1.24)
GFR calc Af Amer: >60 mL/min (ref >60)
GFR calc non Af Amer: >60 mL/min (ref >60)
Glucose, Bld: 120 mg/dL — ABNORMAL HIGH (ref 70–99)
Potassium: 4 mmol/L (ref 3.5–5.1)
Sodium: 135 mmol/L (ref 135–145)
Total Bilirubin: 0.5 mg/dL (ref 0.3–1.2)
Total Protein: 7.4 g/dL (ref 6.5–8.1)

## 2019-08-18 LAB — MAGNESIUM: Magnesium: 1.7 mg/dL (ref 1.7–2.4)

## 2019-08-18 LAB — VITAMIN B12: Vitamin B-12: 857 pg/mL (ref 180–914)

## 2019-08-18 MED ORDER — HEPARIN SOD (PORK) LOCK FLUSH 100 UNIT/ML IV SOLN
500.0000 [IU] | Freq: Once | INTRAVENOUS | Status: AC | PRN
  Administered 2019-08-18: 500 [IU]
  Filled 2019-08-18: qty 5

## 2019-08-18 MED ORDER — SODIUM CHLORIDE 0.9 % IV SOLN
Freq: Once | INTRAVENOUS | Status: AC
  Filled 2019-08-18: qty 250

## 2019-08-18 MED ORDER — HEPARIN SOD (PORK) LOCK FLUSH 100 UNIT/ML IV SOLN
INTRAVENOUS | Status: AC
  Filled 2019-08-18: qty 5

## 2019-08-18 MED ORDER — SODIUM CHLORIDE 0.9% FLUSH
10.0000 mL | Freq: Once | INTRAVENOUS | Status: AC
  Administered 2019-08-18: 09:00:00 10 mL via INTRAVENOUS
  Filled 2019-08-18: qty 10

## 2019-08-18 MED ORDER — SLOW-MAG 71.5-119 MG PO TBEC: DELAYED_RELEASE_TABLET | ORAL | 0 refills | Status: DC

## 2019-08-18 MED ORDER — SODIUM CHLORIDE 0.9 % IV SOLN
10.0000 mg/kg | Freq: Once | INTRAVENOUS | Status: AC
  Administered 2019-08-18: 620 mg via INTRAVENOUS
  Filled 2019-08-18: qty 10

## 2019-08-18 NOTE — Telephone Encounter (Signed)
Open in erro 

## 2019-08-18 NOTE — Progress Notes (Signed)
Patient does not offer any problems today.  

## 2019-08-18 NOTE — Progress Notes (Signed)
Hematology/Oncology Follow up note [facility] [phone]) [phone] [phone]) [phone]   Patient Care Team: Tracie Harrier, MD as PCP - General (Internal Medicine) Earlie Server, MD as Consulting Physician (Hematology and Oncology)  REASON FOR VISIT:  Follow up for treatment of squamous lung cancer.   HISTORY OF PRESENTING ILLNESS:  # Dec 2019 Stage I squamous lung cancer #s/p Bronchoscopy on 06/25/2018. subcarina EBUS FNA was non diagnostic, hypocellular specimen.  # 08/13/2018 CT guided right upper lobe biopsy pathology showed dense fibrosis and mixed inflammatory cells with prominent polytypic plasma cells component. Focal benign bronchial wall and alveolar spaces. No malignancy was identified.   # 08/13/2018 underwent right thoracotomy and wedge resection of a right upper lobe mass.  Frozen section was consistent with an inflammatory process.  On the second postop day, preliminary pathology reports high-grade malignancy.  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  08/20/2018 Patient was therefore taken back to the OR and underwent take complete lobectomy  pT1b pN0 cM0 stage I squamous cell lung cancer.  Margin is negative.  Recommend observation.    # 12/03/2018 CT chest w contrast showed local recurrence.  12/10/2018 PET showed No evidence of distant metastatic disease # 12/26/2018 s/p bronchoscopy biopsy. Confirmed local recurrence of squamous cell lung cancer.   medi port placed by Dr.Oaks.  Cancer treatment Started concurrent chemoradiation on 01/16/2019 Carboplatin AUC of 2 and Taxol 45 mg/m2 weekly finished in 03/05/2019.   INTERVAL HISTORY Samuel Choi is a 54 y.o. male who has above history reviewed by me today presents for evaluation prior to immunotherapy for treatment of  recurrent  squamous cell lung cancer. Patient reports feeling well today..  Currently on immunotherapy maintenance with durvalumab. He reports feeling well.  Patient reports  no new complaints.   Review of Systems  Constitutional: Negative for appetite change, chills, fatigue, fever and unexpected weight change.  HENT:   Negative for hearing loss and voice change.   Eyes: Negative for eye problems and icterus.  Respiratory: Negative for chest tightness, cough and shortness of breath.   Cardiovascular: Negative for chest pain and leg swelling.  Gastrointestinal: Negative for abdominal distention, abdominal pain and diarrhea.  Endocrine: Negative for hot flashes.  Genitourinary: Negative for difficulty urinating, dysuria and frequency.   Musculoskeletal: Negative for arthralgias.  Skin: Negative for itching and rash.  Neurological: Negative for light-headedness and numbness.  Hematological: Negative for adenopathy. Does not bruise/bleed easily.  Psychiatric/Behavioral: Negative for confusion.    MEDICAL HISTORY:  Past Medical History:  Diagnosis Date  . Alcohol abuse    usually drinks 2-3 drinks per day  . Atherosclerosis 06/2018  . Chronic sinusitis   . Dehydration 02/07/2019  . Emphysema of lung ([place]) 06/2018   patient unaware of this.  . Hip fracture ([place]) 05/2018   no surgery  . History of kidney stones 05/2018   per xray, bilateral nephrolitiasis  . Hypertension   . Squamous cell carcinoma of lung, right ([place]) 06/2018    SURGICAL HISTORY: Past Surgical History:  Procedure Laterality Date  . BRAIN SURGERY  10/2017   nasal/sinus endoscopy. mass benign  . ELECTROMAGNETIC NAVIGATION BROCHOSCOPY Right 06/25/2018   Procedure: ELECTROMAGNETIC NAVIGATION BRONCHOSCOPY;  Surgeon: [name], MD;  Location: [facility] OR[facility];  Service: Cardiopulmonary;  Laterality: Right;  . ENDOBRONCHIAL ULTRASOUND Right 06/25/2018   Procedure: ENDOBRONCHIAL ULTRASOUND;  Surgeon: [name], MD;  Location: [facility] OR[facility];  Service: Cardiopulmonary;  Laterality: Right;  . ENDOBRONCHIAL ULTRASOUND Right 12/26/2018   Procedure: ENDOBRONCHIAL  ULTRASOUND RIGHT;  Surgeon: [name], MD;  Location: [facility] OR[facility];  Service: Cardiopulmonary;  Laterality: Right;  . FLEXIBLE BRONCHOSCOPY N/A 08/20/2018   Procedure: FLEXIBLE BRONCHOSCOPY PREOP;  Surgeon: [name], MD;  Location: [facility] OR[facility];  Service: Thoracic;  Laterality: N/A;  . IR CV LINE INJECTION  04/04/2019  . NASAL SINUS SURGERY  10/2017   At [facility], frontal sinusotomy, ethmoidectomy, resection anterior cranial fossa neoplasm, turbinate resection  . PORTACATH PLACEMENT Left 01/15/2019   Procedure: INSERTION PORT-A-CATH;  Surgeon: [name], MD;  Location: [facility] OR[facility];  Service: General;  Laterality: Left;  . THORACOTOMY Right 08/13/2018   Procedure: PREOP BROCHOSCOPY WITH RIGHT THORACOTOMY AND RUL RESECTION;  Surgeon: [name], MD;  Location: [facility] OR[facility];  Service: General;  Laterality: Right;  . THORACOTOMY Right 08/20/2018   Procedure: THORACOTOMY MAJOR RIGHT UPPER LOBE LOBECTOMY;  Surgeon: [name], MD;  Location: [facility] OR[facility];  Service: Thoracic;  Laterality: Right;  . TOE SURGERY Left    pin in left toe    SOCIAL HISTORY: Social History   Socioeconomic History  . Marital status: Married    Spouse name: lisa  . Number of children: Not on file  . Years of education: Not on file  . Highest education level: Not on file  Occupational History  . Occupation: welding    Comment: taking time off to resolve issues  Tobacco Use  . Smoking status: Former Smoker    Packs/day: 0.50    Years: 15.00    Pack years: 7.50    Types: Cigarettes    Quit date: 08/2018    Years since quitting: 1.0  . Smokeless tobacco: Never Used  Substance and Sexual Activity  . Alcohol use: Yes    Alcohol/week: 3.0 standard drinks    Types: 3 Cans of beer per week    Comment: usually 2 drinks per week, per patient  . Drug use: No  . Sexual activity: Not on file  Other Topics Concern  . Not on file  Social History Narrative  . Not on file   Social Determinants of Health   Financial Resource Strain:   . Difficulty of Paying  Living Expenses: Not on file  Food Insecurity:   . Worried About Charity fundraiser in the Last Year: Not on file  . Ran Out of Food in the Last Year: Not on file  Transportation Needs:   . Lack of Transportation (Medical): Not on file  . Lack of Transportation (Non-Medical): Not on file  Physical Activity:   . Days of Exercise per Week: Not on file  . Minutes of Exercise per Session: Not on file  Stress:   . Feeling of Stress : Not on file  Social Connections:   . Frequency of Communication with Friends and Family: Not on file  . Frequency of Social Gatherings with Friends and Family: Not on file  . Attends Religious Services: Not on file  . Active Member of Clubs or Organizations: Not on file  . Attends Archivist Meetings: Not on file  . Marital Status: Not on file  Intimate Partner Violence:   . Fear of Current or Ex-Partner: Not on file  . Emotionally Abused: Not on file  . Physically Abused: Not on file  . Sexually Abused: Not on file    FAMILY HISTORY: Family History  Problem Relation Age of Onset  . Breast cancer Mother   . Diabetes Mother   . Lung cancer Father   . Hypertension Father  ALLERGIES:  has No Known Allergies.  MEDICATIONS:  Current Outpatient Medications  Medication Sig Dispense Refill  . albuterol (VENTOLIN HFA) 108 (90 Base) MCG/ACT inhaler Inhale 2 puffs into the lungs every 6 (six) hours as needed for wheezing or shortness of breath. 8 g 6  . cloNIDine (CATAPRES) 0.1 MG tablet Take 0.1 mg by mouth daily.   5  . diltiazem (CARDIZEM CD) 120 MG 24 hr capsule Take 120 mg by mouth daily.    . folic acid (V-R FOLIC ACID) 557 MCG tablet Take 1 tablet (400 mcg total) by mouth daily. 90 tablet 1  . hydrALAZINE (APRESOLINE) 100 MG tablet Take 50 mg by mouth 2 (two) times daily.     . hydrochlorothiazide (HYDRODIURIL) 12.5 MG tablet Take 12.5 mg by mouth daily.     Marland Kitchen KLOR-CON M20 20 MEQ tablet Take 20 mEq by mouth 2 (two) times daily.    Marland Kitchen  lidocaine-prilocaine (EMLA) cream Apply to affected area once 30 g 3  . magnesium chloride (SLOW-MAG) 64 MG TBEC SR tablet TAKE 1 TABLET (64 MG TOTAL) BY MOUTH DAILY. 90 tablet 0  . methocarbamol (ROBAXIN) 500 MG tablet Take 1 tablet (500 mg total) by mouth at bedtime. 30 tablet 0  . olmesartan (BENICAR) 40 MG tablet Take 40 mg by mouth every other day.     . ondansetron (ZOFRAN) 8 MG tablet Take 1 tablet (8 mg total) by mouth 2 (two) times daily as needed for refractory nausea / vomiting. Start on day 3 after chemo. 30 tablet 1  . oxyCODONE-acetaminophen (PERCOCET) 5-325 MG tablet Take 1-2 tablets by mouth every 4 (four) hours as needed for severe pain. 20 tablet 0  . prochlorperazine (COMPAZINE) 10 MG tablet Take 1 tablet (10 mg total) by mouth every 6 (six) hours as needed (Nausea or vomiting). 30 tablet 1  . vitamin B-12 (CYANOCOBALAMIN) 1000 MCG tablet Take 1 tablet (1,000 mcg total) by mouth daily. 90 tablet 1   No current facility-administered medications for this visit.   Facility-Administered Medications Ordered in Other Visits  Medication Dose Route Frequency Provider Last Rate Last Admin  . albuterol (PROVENTIL) (2.5 MG/3ML) 0.083% nebulizer solution 2.5 mg  2.5 mg Nebulization Once System, Provider Not In      . heparin lock flush 100 unit/mL  500 Units Intravenous Once Earlie Server, MD      . sodium chloride flush (NS) 0.9 % injection 10 mL  10 mL Intravenous PRN Earlie Server, MD   10 mL at 04/04/19 0903     PHYSICAL EXAMINATION: ECOG PERFORMANCE STATUS: 0 - Asymptomatic Vitals:   08/18/19 0920  BP: 127/79  Pulse: 93  Resp: 16  Temp: 98.4 F (36.9 C)   Filed Weights   08/18/19 0920  Weight: 145 lb 12.8 oz (66.1 kg)    Physical Exam Constitutional:      General: He is not in acute distress.    Comments: Thin  HENT:     Head: Normocephalic and atraumatic.  Eyes:     General: No scleral icterus.    Pupils: Pupils are equal, round, and reactive to light.  Cardiovascular:       Rate and Rhythm: Regular rhythm. Bradycardia present.     Heart sounds: Normal heart sounds.  Pulmonary:     Effort: Pulmonary effort is normal. No respiratory distress.     Breath sounds: No wheezing.  Abdominal:     General: Bowel sounds are normal. There is no distension.  Palpations: Abdomen is soft. There is no mass.     Tenderness: There is no abdominal tenderness.  Musculoskeletal:        General: No deformity. Normal range of motion.     Cervical back: Normal range of motion and neck supple.  Skin:    General: Skin is warm and dry.     Findings: No erythema or rash.  Neurological:     Mental Status: He is alert and oriented to person, place, and time. Mental status is at baseline.     Cranial Nerves: No cranial nerve deficit.     Coordination: Coordination normal.  Psychiatric:        Mood and Affect: Mood normal.        Behavior: Behavior normal.        Thought Content: Thought content normal.      LABORATORY DATA:  I have reviewed the data as listed Lab Results  Component Value Date   WBC 9.8 08/18/2019   HGB 14.5 08/18/2019   HCT 42.4 08/18/2019   MCV 105.2 (H) 08/18/2019   PLT 143 (L) 08/18/2019   Recent Labs    07/14/19 0914 08/04/19 0909 08/18/19 0901  NA 141 138 135  K 3.8 3.9 4.0  CL 108 105 105  CO2 24 22 20*  GLUCOSE 88 106* 120*  BUN _0 CREATININE 0.87 1.01 0.89  CALCIUM 9.2 9.4 9.4  GFRNONAA >60 >60 >60  GFRAA >60 >60 >60  PROT 7.3 7.3 7.4  ALBUMIN 3.6 3.7 3.8  AST 26 30 33  ALT _1 ALKPHOS 75 82 76  BILITOT 0.3 0.4 0.5   Iron/TIBC/Ferritin/ %Sat No results found for: IRON, TIBC, FERRITIN, IRONPCTSAT   Pathology 12/26/2018 Right hilar adenopathy/mass-EUS guided FNA Positive for malignancy, non small cell carcinoma, morphologically similar to previous right upper lobe squamous cell carcinoma.   ASSESSMENT & PLAN:  1. Squamous cell carcinoma of lung, right ([place])   2. Hypomagnesemia   3. Encounter for  antineoplastic immunotherapy   4. Macrocytosis without anemia    #Recurrent Squamous cell carcinoma of lung,  Patient tolerates immunotherapy maintenance well. Interval CT scan was independently viewed by me discussed with patient. No evidence of metastasis or progression of disease   #Macrocytosis, Vitamin B12 deficiency, continue oral vitamin B12 supplementation. B12 is pending.  # Folic deficiency, continue folic acid supplementation.  #Thrombocytopenia, platelet count is at 143,000.  Stable. Monitor. Marland Kitchen    Hypomagnesium, magnesium is decreased at 1.7 today.  conitnue Slow-Mag once daily and refill Rx.    Return of visit: 2 weeks.    Earlie Server, MD, PhD Hematology Oncology [facility] technician smear is a possible [facility] 08/18/2019

## 2019-09-01 ENCOUNTER — Other Ambulatory Visit: Payer: Self-pay

## 2019-09-01 ENCOUNTER — Inpatient Hospital Stay (HOSPITAL_BASED_OUTPATIENT_CLINIC_OR_DEPARTMENT_OTHER): Payer: Managed Care, Other (non HMO) | Admitting: Oncology

## 2019-09-01 ENCOUNTER — Inpatient Hospital Stay: Payer: Managed Care, Other (non HMO)

## 2019-09-01 ENCOUNTER — Encounter: Payer: Self-pay | Admitting: Oncology

## 2019-09-01 VITALS — HR 89

## 2019-09-01 VITALS — BP 159/91 | Temp 98.3°F | Resp 16 | Wt 148.9 lb

## 2019-09-01 DIAGNOSIS — C3491 Malignant neoplasm of unspecified part of right bronchus or lung: Secondary | ICD-10-CM | POA: Diagnosis not present

## 2019-09-01 DIAGNOSIS — D7589 Other specified diseases of blood and blood-forming organs: Secondary | ICD-10-CM | POA: Diagnosis not present

## 2019-09-01 DIAGNOSIS — D696 Thrombocytopenia, unspecified: Secondary | ICD-10-CM

## 2019-09-01 DIAGNOSIS — E876 Hypokalemia: Secondary | ICD-10-CM

## 2019-09-01 DIAGNOSIS — Z95828 Presence of other vascular implants and grafts: Secondary | ICD-10-CM

## 2019-09-01 DIAGNOSIS — Z5112 Encounter for antineoplastic immunotherapy: Secondary | ICD-10-CM | POA: Diagnosis not present

## 2019-09-01 DIAGNOSIS — E538 Deficiency of other specified B group vitamins: Secondary | ICD-10-CM

## 2019-09-01 DIAGNOSIS — E860 Dehydration: Secondary | ICD-10-CM

## 2019-09-01 LAB — COMPREHENSIVE METABOLIC PANEL
ALT: 23 U/L (ref 0–44)
AST: 33 U/L (ref 15–41)
Albumin: 3.4 g/dL — ABNORMAL LOW (ref 3.5–5.0)
Alkaline Phosphatase: 83 U/L (ref 38–126)
Anion gap: 12 (ref 5–15)
BUN: 8 mg/dL (ref 6–20)
CO2: 22 mmol/L (ref 22–32)
Calcium: 8.8 mg/dL — ABNORMAL LOW (ref 8.9–10.3)
Chloride: 104 mmol/L (ref 98–111)
Creatinine, Ser: 0.87 mg/dL (ref 0.61–1.24)
GFR calc Af Amer: >60 mL/min (ref >60)
GFR calc non Af Amer: >60 mL/min (ref >60)
Glucose, Bld: 146 mg/dL — ABNORMAL HIGH (ref 70–99)
Potassium: 3 mmol/L — ABNORMAL LOW (ref 3.5–5.1)
Sodium: 138 mmol/L (ref 135–145)
Total Bilirubin: 0.8 mg/dL (ref 0.3–1.2)
Total Protein: 6.8 g/dL (ref 6.5–8.1)

## 2019-09-01 LAB — CBC WITH DIFFERENTIAL/PLATELET
Abs Immature Granulocytes: 0.03 10*3/uL (ref 0.00–0.07)
Basophils Absolute: 0 10*3/uL (ref 0.0–0.1)
Basophils Relative: 0 %
Eosinophils Absolute: 0.1 10*3/uL (ref 0.0–0.5)
Eosinophils Relative: 2 %
HCT: 38.7 % — ABNORMAL LOW (ref 39.0–52.0)
Hemoglobin: 13.5 g/dL (ref 13.0–17.0)
Immature Granulocytes: 0 %
Lymphocytes Relative: 9 %
Lymphs Abs: 0.8 10*3/uL (ref 0.7–4.0)
MCH: 35.6 pg — ABNORMAL HIGH (ref 26.0–34.0)
MCHC: 34.9 g/dL (ref 30.0–36.0)
MCV: 102.1 fL — ABNORMAL HIGH (ref 80.0–100.0)
Monocytes Absolute: 0.7 10*3/uL (ref 0.1–1.0)
Monocytes Relative: 8 %
Neutro Abs: 7.3 10*3/uL (ref 1.7–7.7)
Neutrophils Relative %: 81 %
Platelets: 133 10*3/uL — ABNORMAL LOW (ref 150–400)
RBC: 3.79 MIL/uL — ABNORMAL LOW (ref 4.22–5.81)
RDW: 11.8 % (ref 11.5–15.5)
WBC: 9 10*3/uL (ref 4.0–10.5)
nRBC: 0 % (ref 0.0–0.2)

## 2019-09-01 LAB — MAGNESIUM: Magnesium: 1.4 mg/dL — ABNORMAL LOW (ref 1.7–2.4)

## 2019-09-01 MED ORDER — HEPARIN SOD (PORK) LOCK FLUSH 100 UNIT/ML IV SOLN
INTRAVENOUS | Status: AC
  Filled 2019-09-01: qty 5

## 2019-09-01 MED ORDER — SODIUM CHLORIDE 0.9% FLUSH
10.0000 mL | Freq: Once | INTRAVENOUS | Status: AC
  Administered 2019-09-01: 10 mL via INTRAVENOUS
  Filled 2019-09-01: qty 10

## 2019-09-01 MED ORDER — KLOR-CON M20 20 MEQ PO TBCR: 20.0000 meq | EXTENDED_RELEASE_TABLET | Freq: Two times a day (BID) | ORAL | 0 refills | Status: DC

## 2019-09-01 MED ORDER — SODIUM CHLORIDE 0.9 % IV SOLN
Freq: Once | INTRAVENOUS | Status: AC
  Filled 2019-09-01: qty 250

## 2019-09-01 MED ORDER — HEPARIN SOD (PORK) LOCK FLUSH 100 UNIT/ML IV SOLN
500.0000 [IU] | Freq: Once | INTRAVENOUS | Status: AC | PRN
  Administered 2019-09-01: 500 [IU]
  Filled 2019-09-01: qty 5

## 2019-09-01 MED ORDER — SODIUM CHLORIDE 0.9 % IV SOLN
600.0000 mg | Freq: Once | INTRAVENOUS | Status: AC
  Administered 2019-09-01: 600 mg via INTRAVENOUS
  Filled 2019-09-01: qty 12

## 2019-09-01 MED ORDER — SODIUM CHLORIDE 0.9 % IV SOLN
Freq: Every day | INTRAVENOUS | Status: DC | PRN
  Filled 2019-09-01 (×2): qty 10

## 2019-09-01 NOTE — Progress Notes (Signed)
Patient does not offer any problems today.  

## 2019-09-01 NOTE — Progress Notes (Signed)
Hematology/Oncology Follow up note [facility] [phone] [phone]   Patient Care Team: Tracie Harrier, MD as PCP - General (Internal Medicine) Earlie Server, MD as Consulting Physician (Hematology and Oncology)  REASON FOR VISIT:  Follow up for treatment of squamous lung cancer.   HISTORY OF PRESENTING ILLNESS:  # Dec 2019 Stage I squamous lung cancer #s/p Bronchoscopy on 06/25/2018. subcarina EBUS FNA was non diagnostic, hypocellular specimen.  # 08/13/2018 CT guided right upper lobe biopsy pathology showed dense fibrosis and mixed inflammatory cells with prominent polytypic plasma cells component. Focal benign bronchial wall and alveolar spaces. No malignancy was identified.   # 08/13/2018 underwent right thoracotomy and wedge resection of a right upper lobe mass.  Frozen section was consistent with an inflammatory process.  On the second postop day, preliminary pathology reports high-grade malignancy.  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  08/20/2018 Patient was therefore taken back to the OR and underwent take complete lobectomy  pT1b pN0 cM0 stage I squamous cell lung cancer.  Margin is negative.  Recommend observation.    # 12/03/2018 CT chest w contrast showed local recurrence.  12/10/2018 PET showed No evidence of distant metastatic disease # 12/26/2018 s/p bronchoscopy biopsy. Confirmed local recurrence of squamous cell lung cancer.   medi port placed by Dr.Oaks.  Cancer treatment Started concurrent chemoradiation on 01/16/2019 Carboplatin AUC of 2 and Taxol 45 mg/m2 weekly finished in 03/05/2019.   INTERVAL HISTORY Samuel Choi is a 54 y.o. male who has above history reviewed by me today presents for evaluation prior to immunotherapy for treatment of  recurrent  squamous cell lung cancer. Patient reports feeling well today..  Currently on immunotherapy maintenance with durvalumab. Patient reports feeling well.  Denies any  new complaints No diarrhea, skin rash, shortness of breath.   Review of Systems  Constitutional: Negative for appetite change, chills, fatigue, fever and unexpected weight change.  HENT:   Negative for hearing loss and voice change.   Eyes: Negative for eye problems and icterus.  Respiratory: Negative for chest tightness, cough and shortness of breath.   Cardiovascular: Negative for chest pain and leg swelling.  Gastrointestinal: Negative for abdominal distention, abdominal pain and diarrhea.  Endocrine: Negative for hot flashes.  Genitourinary: Negative for difficulty urinating, dysuria and frequency.   Musculoskeletal: Negative for arthralgias.  Skin: Negative for itching and rash.  Neurological: Negative for light-headedness and numbness.  Hematological: Negative for adenopathy. Does not bruise/bleed easily.  Psychiatric/Behavioral: Negative for confusion.    MEDICAL HISTORY:  Past Medical History:  Diagnosis Date  . Alcohol abuse    usually drinks 2-3 drinks per day  . Atherosclerosis 06/2018  . Chronic sinusitis   . Dehydration 02/07/2019  . Emphysema of lung ([place]) 06/2018   patient unaware of this.  . Hip fracture ([place]) 05/2018   no surgery  . History of kidney stones 05/2018   per xray, bilateral nephrolitiasis  . Hypertension   . Squamous cell carcinoma of lung, right ([place]) 06/2018    SURGICAL HISTORY: Past Surgical History:  Procedure Laterality Date  . BRAIN SURGERY  10/2017   nasal/sinus endoscopy. mass benign  . ELECTROMAGNETIC NAVIGATION BROCHOSCOPY Right 06/25/2018   Procedure: ELECTROMAGNETIC NAVIGATION BRONCHOSCOPY;  Surgeon: [name], MD;  Location: [facility] ORS;  Service: Cardiopulmonary;  Laterality: Right;  . ENDOBRONCHIAL ULTRASOUND Right 06/25/2018   Procedure: ENDOBRONCHIAL ULTRASOUND;  Surgeon: [name], MD;  Location: [facility] OR[facility];  Service: Cardiopulmonary;  Laterality: Right;  . ENDOBRONCHIAL ULTRASOUND  Right 12/26/2018   Procedure:  ENDOBRONCHIAL ULTRASOUND RIGHT;  Surgeon: [name], MD;  Location: [facility] OR[facility];  Service: Cardiopulmonary;  Laterality: Right;  . FLEXIBLE BRONCHOSCOPY N/A 08/20/2018   Procedure: FLEXIBLE BRONCHOSCOPY PREOP;  Surgeon: [name], MD;  Location: [facility] ORS;  Service: Thoracic;  Laterality: N/A;  . IR CV LINE INJECTION  04/04/2019  . NASAL SINUS SURGERY  10/2017   At [facility], frontal sinusotomy, ethmoidectomy, resection anterior cranial fossa neoplasm, turbinate resection  . PORTACATH PLACEMENT Left 01/15/2019   Procedure: INSERTION PORT-A-CATH;  Surgeon: [name], MD;  Location: [facility] ORS;  Service: General;  Laterality: Left;  . THORACOTOMY Right 08/13/2018   Procedure: PREOP BROCHOSCOPY WITH RIGHT THORACOTOMY AND RUL RESECTION;  Surgeon: [name], MD;  Location: [facility] OR[facility];  Service: General;  Laterality: Right;  . THORACOTOMY Right 08/20/2018   Procedure: THORACOTOMY MAJOR RIGHT UPPER LOBE LOBECTOMY;  Surgeon: [name], MD;  Location: [facility] OR[facility];  Service: Thoracic;  Laterality: Right;  . TOE SURGERY Left    pin in left toe    SOCIAL HISTORY: Social History   Socioeconomic History  . Marital status: Married    Spouse name: lisa  . Number of children: Not on file  . Years of education: Not on file  . Highest education level: Not on file  Occupational History  . Occupation: welding    Comment: taking time off to resolve issues  Tobacco Use  . Smoking status: Former Smoker    Packs/day: 0.50    Years: 15.00    Pack years: 7.50    Types: Cigarettes    Quit date: 08/2018    Years since quitting: 1.0  . Smokeless tobacco: Never Used  Substance and Sexual Activity  . Alcohol use: Yes    Alcohol/week: 3.0 standard drinks    Types: 3 Cans of beer per week    Comment: usually 2 drinks per week, per patient  . Drug use: No  . Sexual activity: Not on file  Other Topics Concern  . Not on file  Social History Narrative  . Not on file   Social Determinants of Health    Financial Resource Strain:   . Difficulty of Paying Living Expenses: Not on file  Food Insecurity:   . Worried About Charity fundraiser in the Last Year: Not on file  . Ran Out of Food in the Last Year: Not on file  Transportation Needs:   . Lack of Transportation (Medical): Not on file  . Lack of Transportation (Non-Medical): Not on file  Physical Activity:   . Days of Exercise per Week: Not on file  . Minutes of Exercise per Session: Not on file  Stress:   . Feeling of Stress : Not on file  Social Connections:   . Frequency of Communication with Friends and Family: Not on file  . Frequency of Social Gatherings with Friends and Family: Not on file  . Attends Religious Services: Not on file  . Active Member of Clubs or Organizations: Not on file  . Attends Archivist Meetings: Not on file  . Marital Status: Not on file  Intimate Partner Violence:   . Fear of Current or Ex-Partner: Not on file  . Emotionally Abused: Not on file  . Physically Abused: Not on file  . Sexually Abused: Not on file    FAMILY HISTORY: Family History  Problem Relation Age of Onset  . Breast cancer Mother   . Diabetes Mother   . Lung cancer  Father   . Hypertension Father     ALLERGIES:  has No Known Allergies.  MEDICATIONS:  Current Outpatient Medications  Medication Sig Dispense Refill  . albuterol (VENTOLIN HFA) 108 (90 Base) MCG/ACT inhaler Inhale 2 puffs into the lungs every 6 (six) hours as needed for wheezing or shortness of breath. 8 g 6  . cloNIDine (CATAPRES) 0.1 MG tablet Take 0.1 mg by mouth daily.   5  . diltiazem (CARDIZEM CD) 120 MG 24 hr capsule Take 120 mg by mouth daily.    . folic acid (V-R FOLIC ACID) 767 MCG tablet Take 1 tablet (400 mcg total) by mouth daily. 90 tablet 1  . hydrALAZINE (APRESOLINE) 100 MG tablet Take 50 mg by mouth 2 (two) times daily.     . hydrochlorothiazide (HYDRODIURIL) 12.5 MG tablet Take 12.5 mg by mouth daily.     Marland Kitchen KLOR-CON M20 20 MEQ  tablet Take 20 mEq by mouth 2 (two) times daily.    Marland Kitchen lidocaine-prilocaine (EMLA) cream Apply to affected area once 30 g 3  . magnesium chloride (SLOW-MAG) 64 MG TBEC SR tablet TAKE 1 TABLET (64 MG TOTAL) BY MOUTH DAILY. 90 tablet 0  . methocarbamol (ROBAXIN) 500 MG tablet Take 1 tablet (500 mg total) by mouth at bedtime. 30 tablet 0  . olmesartan (BENICAR) 40 MG tablet Take 40 mg by mouth every other day.     . ondansetron (ZOFRAN) 8 MG tablet Take 1 tablet (8 mg total) by mouth 2 (two) times daily as needed for refractory nausea / vomiting. Start on day 3 after chemo. 30 tablet 1  . oxyCODONE-acetaminophen (PERCOCET) 5-325 MG tablet Take 1-2 tablets by mouth every 4 (four) hours as needed for severe pain. 20 tablet 0  . prochlorperazine (COMPAZINE) 10 MG tablet Take 1 tablet (10 mg total) by mouth every 6 (six) hours as needed (Nausea or vomiting). 30 tablet 1  . vitamin B-12 (CYANOCOBALAMIN) 1000 MCG tablet Take 1 tablet (1,000 mcg total) by mouth daily. 90 tablet 1   No current facility-administered medications for this visit.   Facility-Administered Medications Ordered in Other Visits  Medication Dose Route Frequency Provider Last Rate Last Admin  . albuterol (PROVENTIL) (2.5 MG/3ML) 0.083% nebulizer solution 2.5 mg  2.5 mg Nebulization Once System, Provider Not In      . heparin lock flush 100 unit/mL  500 Units Intravenous Once Earlie Server, MD      . sodium chloride 0.9 % 250 mL with potassium chloride 20 mEq, magnesium sulfate 2 g infusion   Intravenous Daily PRN Earlie Server, MD   Stopped at 09/01/19 1256  . sodium chloride flush (NS) 0.9 % injection 10 mL  10 mL Intravenous PRN Earlie Server, MD   10 mL at 04/04/19 0903     PHYSICAL EXAMINATION: ECOG PERFORMANCE STATUS: 0 - Asymptomatic Vitals:   09/01/19 0919  BP: (!) 159/91  Resp: 16  Temp: 98.3 F (36.8 C)   Filed Weights   09/01/19 0919  Weight: 148 lb 14.4 oz (67.5 kg)    Physical Exam Constitutional:      General: He is not  in acute distress.    Comments: Thin  HENT:     Head: Normocephalic and atraumatic.  Eyes:     General: No scleral icterus.    Pupils: Pupils are equal, round, and reactive to light.  Cardiovascular:     Rate and Rhythm: Regular rhythm.     Heart sounds: Normal heart sounds.  Pulmonary:  Effort: Pulmonary effort is normal. No respiratory distress.     Breath sounds: No wheezing.  Abdominal:     General: Bowel sounds are normal. There is no distension.     Palpations: Abdomen is soft. There is no mass.     Tenderness: There is no abdominal tenderness.  Musculoskeletal:        General: No deformity. Normal range of motion.     Cervical back: Normal range of motion and neck supple.  Skin:    General: Skin is warm and dry.     Findings: No erythema or rash.  Neurological:     Mental Status: He is alert and oriented to person, place, and time. Mental status is at baseline.     Cranial Nerves: No cranial nerve deficit.     Coordination: Coordination normal.  Psychiatric:        Mood and Affect: Mood normal.        Behavior: Behavior normal.        Thought Content: Thought content normal.      LABORATORY DATA:  I have reviewed the data as listed Lab Results  Component Value Date   WBC 9.0 09/01/2019   HGB 13.5 09/01/2019   HCT 38.7 (L) 09/01/2019   MCV 102.1 (H) 09/01/2019   PLT 133 (L) 09/01/2019   Recent Labs    08/04/19 0909 08/18/19 0901 09/01/19 0856  NA 138 135 138  K 3.9 4.0 3.0*  CL 105 105 104  CO2 22 20* 22  GLUCOSE 106* 120* 146*  BUN _0 CREATININE 1.01 0.89 0.87  CALCIUM 9.4 9.4 8.8*  GFRNONAA >60 >60 >60  GFRAA >60 >60 >60  PROT 7.3 7.4 6.8  ALBUMIN 3.7 3.8 3.4*  AST 30 33 33  ALT _1 ALKPHOS 82 76 83  BILITOT 0.4 0.5 0.8   Iron/TIBC/Ferritin/ %Sat No results found for: IRON, TIBC, FERRITIN, IRONPCTSAT   Pathology 12/26/2018 Right hilar adenopathy/mass-EUS guided FNA Positive for malignancy, non small cell carcinoma,  morphologically similar to previous right upper lobe squamous cell carcinoma.   ASSESSMENT & PLAN:  1. Squamous cell carcinoma of lung, right ([place])   2. Hypomagnesemia   3. Macrocytosis without anemia   4. Vitamin B12 deficiency   5. Encounter for antineoplastic immunotherapy   6. Hypokalemia    #Recurrent Squamous cell carcinoma of lung,  Patient tolerates immunotherapy maintenance well. Labs are reviewed and discussed with patient. Counts acceptable to proceed with immunotherapy maintenance durvalumab.  #Macrocytosis, Vitamin B12 deficiency, B12 level has improved.  Continue vitamin B12 supplementation. # Folic deficiency, continue folic acid supplementation.  #Thrombocytopenia, platelet count is at 143,000.  Stable. Monitor. Marland Kitchen    Hypomagnesium, magnesium is decreased at 1.4.  Patient will receive magnesium sulfate IV 2 g x 1.  Continue magnesium supplements. Hypokalemia, potassium 3.0.  Encourage patient to increase potassium rich food.  Continue potassium chloride 20 mEq twice daily.  Refills were sent to pharmacy.  Return of visit: 2 weeks.    Earlie Server, MD, PhD Hematology Oncology [facility] technician smear is a possible [facility] 09/01/2019

## 2019-09-01 NOTE — Progress Notes (Signed)
Change Imfinzi to 600mg  today due to plasma delivery has been delayed for a week, ok per MD

## 2019-09-12 ENCOUNTER — Ambulatory Visit: Payer: Self-pay | Admitting: Radiation Oncology

## 2019-09-15 ENCOUNTER — Telehealth: Payer: Self-pay

## 2019-09-15 ENCOUNTER — Inpatient Hospital Stay: Payer: Managed Care, Other (non HMO) | Attending: Oncology

## 2019-09-15 ENCOUNTER — Other Ambulatory Visit: Payer: Self-pay

## 2019-09-15 ENCOUNTER — Inpatient Hospital Stay (HOSPITAL_BASED_OUTPATIENT_CLINIC_OR_DEPARTMENT_OTHER): Payer: Managed Care, Other (non HMO) | Admitting: Oncology

## 2019-09-15 ENCOUNTER — Inpatient Hospital Stay: Payer: Managed Care, Other (non HMO)

## 2019-09-15 ENCOUNTER — Encounter: Payer: Self-pay | Admitting: Oncology

## 2019-09-15 VITALS — BP 170/97 | HR 100 | Temp 97.7°F | Wt 147.7 lb

## 2019-09-15 DIAGNOSIS — Z833 Family history of diabetes mellitus: Secondary | ICD-10-CM | POA: Diagnosis not present

## 2019-09-15 DIAGNOSIS — E876 Hypokalemia: Secondary | ICD-10-CM | POA: Insufficient documentation

## 2019-09-15 DIAGNOSIS — Z801 Family history of malignant neoplasm of trachea, bronchus and lung: Secondary | ICD-10-CM | POA: Insufficient documentation

## 2019-09-15 DIAGNOSIS — C3491 Malignant neoplasm of unspecified part of right bronchus or lung: Secondary | ICD-10-CM

## 2019-09-15 DIAGNOSIS — Z8249 Family history of ischemic heart disease and other diseases of the circulatory system: Secondary | ICD-10-CM | POA: Insufficient documentation

## 2019-09-15 DIAGNOSIS — D7589 Other specified diseases of blood and blood-forming organs: Secondary | ICD-10-CM

## 2019-09-15 DIAGNOSIS — E538 Deficiency of other specified B group vitamins: Secondary | ICD-10-CM | POA: Insufficient documentation

## 2019-09-15 DIAGNOSIS — Z5112 Encounter for antineoplastic immunotherapy: Secondary | ICD-10-CM

## 2019-09-15 DIAGNOSIS — Z79899 Other long term (current) drug therapy: Secondary | ICD-10-CM | POA: Diagnosis not present

## 2019-09-15 DIAGNOSIS — C3411 Malignant neoplasm of upper lobe, right bronchus or lung: Secondary | ICD-10-CM | POA: Insufficient documentation

## 2019-09-15 DIAGNOSIS — I10 Essential (primary) hypertension: Secondary | ICD-10-CM | POA: Diagnosis not present

## 2019-09-15 DIAGNOSIS — Z87891 Personal history of nicotine dependence: Secondary | ICD-10-CM | POA: Diagnosis not present

## 2019-09-15 DIAGNOSIS — J439 Emphysema, unspecified: Secondary | ICD-10-CM | POA: Diagnosis not present

## 2019-09-15 DIAGNOSIS — D696 Thrombocytopenia, unspecified: Secondary | ICD-10-CM

## 2019-09-15 DIAGNOSIS — Z95828 Presence of other vascular implants and grafts: Secondary | ICD-10-CM

## 2019-09-15 DIAGNOSIS — Z87442 Personal history of urinary calculi: Secondary | ICD-10-CM | POA: Diagnosis not present

## 2019-09-15 DIAGNOSIS — Z803 Family history of malignant neoplasm of breast: Secondary | ICD-10-CM | POA: Insufficient documentation

## 2019-09-15 LAB — CBC WITH DIFFERENTIAL/PLATELET
Abs Immature Granulocytes: 0.04 10*3/uL (ref 0.00–0.07)
Basophils Absolute: 0.1 10*3/uL (ref 0.0–0.1)
Basophils Relative: 1 %
Eosinophils Absolute: 0.1 10*3/uL (ref 0.0–0.5)
Eosinophils Relative: 1 %
HCT: 38.4 % — ABNORMAL LOW (ref 39.0–52.0)
Hemoglobin: 13.1 g/dL (ref 13.0–17.0)
Immature Granulocytes: 1 %
Lymphocytes Relative: 8 %
Lymphs Abs: 0.7 10*3/uL (ref 0.7–4.0)
MCH: 36.1 pg — ABNORMAL HIGH (ref 26.0–34.0)
MCHC: 34.1 g/dL (ref 30.0–36.0)
MCV: 105.8 fL — ABNORMAL HIGH (ref 80.0–100.0)
Monocytes Absolute: 0.8 10*3/uL (ref 0.1–1.0)
Monocytes Relative: 10 %
Neutro Abs: 6.7 10*3/uL (ref 1.7–7.7)
Neutrophils Relative %: 79 %
Platelets: 153 10*3/uL (ref 150–400)
RBC: 3.63 MIL/uL — ABNORMAL LOW (ref 4.22–5.81)
RDW: 13 % (ref 11.5–15.5)
WBC: 8.3 10*3/uL (ref 4.0–10.5)
nRBC: 0 % (ref 0.0–0.2)

## 2019-09-15 LAB — COMPREHENSIVE METABOLIC PANEL
ALT: 30 U/L (ref 0–44)
AST: 43 U/L — ABNORMAL HIGH (ref 15–41)
Albumin: 3.6 g/dL (ref 3.5–5.0)
Alkaline Phosphatase: 80 U/L (ref 38–126)
Anion gap: 11 (ref 5–15)
BUN: 12 mg/dL (ref 6–20)
CO2: 24 mmol/L (ref 22–32)
Calcium: 8.8 mg/dL — ABNORMAL LOW (ref 8.9–10.3)
Chloride: 106 mmol/L (ref 98–111)
Creatinine, Ser: 1 mg/dL (ref 0.61–1.24)
GFR calc Af Amer: >60 mL/min (ref >60)
GFR calc non Af Amer: >60 mL/min (ref >60)
Glucose, Bld: 111 mg/dL — ABNORMAL HIGH (ref 70–99)
Potassium: 3.8 mmol/L (ref 3.5–5.1)
Sodium: 141 mmol/L (ref 135–145)
Total Bilirubin: 0.4 mg/dL (ref 0.3–1.2)
Total Protein: 7.2 g/dL (ref 6.5–8.1)

## 2019-09-15 LAB — MAGNESIUM: Magnesium: 1.7 mg/dL (ref 1.7–2.4)

## 2019-09-15 MED ORDER — SODIUM CHLORIDE 0.9% FLUSH
3.0000 mL | INTRAVENOUS | Status: DC | PRN
  Filled 2019-09-15: qty 3

## 2019-09-15 MED ORDER — ALTEPLASE 2 MG IJ SOLR
2.0000 mg | Freq: Once | INTRAMUSCULAR | Status: DC | PRN
  Filled 2019-09-15: qty 2

## 2019-09-15 MED ORDER — SODIUM CHLORIDE 0.9% FLUSH
10.0000 mL | INTRAVENOUS | Status: DC | PRN
  Filled 2019-09-15: qty 10

## 2019-09-15 MED ORDER — HEPARIN SOD (PORK) LOCK FLUSH 100 UNIT/ML IV SOLN
500.0000 [IU] | Freq: Once | INTRAVENOUS | Status: AC | PRN
  Administered 2019-09-15: 12:00:00 500 [IU]
  Filled 2019-09-15: qty 5

## 2019-09-15 MED ORDER — SODIUM CHLORIDE 0.9 % IV SOLN
620.0000 mg | Freq: Once | INTRAVENOUS | Status: AC
  Administered 2019-09-15: 620 mg via INTRAVENOUS
  Filled 2019-09-15: qty 10

## 2019-09-15 MED ORDER — HEPARIN SOD (PORK) LOCK FLUSH 100 UNIT/ML IV SOLN
INTRAVENOUS | Status: AC
  Filled 2019-09-15: qty 5

## 2019-09-15 MED ORDER — SODIUM CHLORIDE 0.9% FLUSH
10.0000 mL | Freq: Once | INTRAVENOUS | Status: AC
  Administered 2019-09-15: 10 mL via INTRAVENOUS
  Filled 2019-09-15: qty 10

## 2019-09-15 MED ORDER — SODIUM CHLORIDE 0.9 % IV SOLN
Freq: Once | INTRAVENOUS | Status: AC
  Filled 2019-09-15: qty 250

## 2019-09-15 MED ORDER — HEPARIN SOD (PORK) LOCK FLUSH 100 UNIT/ML IV SOLN
250.0000 [IU] | Freq: Once | INTRAVENOUS | Status: DC | PRN
  Filled 2019-09-15: qty 5

## 2019-09-15 NOTE — Progress Notes (Signed)
Hematology/Oncology Follow up note Samuel Choi [phone] [phone]) [phone]   Patient Care Team: Tracie Harrier, MD as PCP - General (Internal Medicine) Earlie Server, MD as Consulting Physician (Hematology and Oncology)  REASON FOR VISIT:  Follow up for treatment of squamous lung cancer.   HISTORY OF PRESENTING ILLNESS:  # Dec 2019 Stage I squamous lung cancer #s/p Bronchoscopy on 06/25/2018. subcarina EBUS FNA was non diagnostic, hypocellular specimen.  # 08/13/2018 CT guided right upper lobe biopsy pathology showed dense fibrosis and mixed inflammatory cells with prominent polytypic plasma cells component. Focal benign bronchial wall and alveolar spaces. No malignancy was identified.   # 08/13/2018 underwent right thoracotomy and wedge resection of a right upper lobe mass.  Frozen section was consistent with an inflammatory process.  On the second postop day, preliminary pathology reports high-grade malignancy.  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  08/20/2018 Patient was therefore taken back to the OR and underwent take complete lobectomy  pT1b pN0 cM0 stage I squamous cell lung cancer.  Margin is negative.  Recommend observation.    # 12/03/2018 CT chest w contrast showed local recurrence.  12/10/2018 PET showed No evidence of distant metastatic disease # 12/26/2018 s/p bronchoscopy biopsy. Confirmed local recurrence of squamous cell lung cancer.   medi port placed by Dr.Oaks.  Cancer treatment Started concurrent chemoradiation on 01/16/2019 Carboplatin AUC of 2 and Taxol 45 mg/m2 weekly finished in 03/05/2019.   INTERVAL HISTORY Samuel Choi is a 54 y.o. male who has above history reviewed by me today presents for evaluation prior to immunotherapy for treatment of  recurrent  squamous cell lung cancer. Patient reports feeling well today..  Currently on immunotherapy maintenance with durvalumab. Patient has no new concerns today.  Denies,  diarrhea, or skin rash   Review of Systems  Constitutional: Negative for appetite change, chills, diaphoresis, fatigue, fever and unexpected weight change.  HENT:   Negative for hearing loss, lump/mass, nosebleeds, sore throat and voice change.   Eyes: Negative for eye problems and icterus.  Respiratory: Negative for chest tightness, cough, hemoptysis, shortness of breath and wheezing.   Cardiovascular: Negative for chest pain and leg swelling.  Gastrointestinal: Negative for abdominal distention, abdominal pain, blood in stool, diarrhea, nausea and rectal pain.  Endocrine: Negative for hot flashes.  Genitourinary: Negative for bladder incontinence, difficulty urinating, dysuria, frequency, hematuria and nocturia.   Musculoskeletal: Negative for arthralgias, back pain, flank pain, gait problem and myalgias.  Skin: Negative for itching and rash.  Neurological: Negative for dizziness, gait problem, headaches, light-headedness, numbness and seizures.  Hematological: Negative for adenopathy. Does not bruise/bleed easily.  Psychiatric/Behavioral: Negative for confusion and decreased concentration. The patient is not nervous/anxious.     MEDICAL HISTORY:  Past Medical History:  Diagnosis Date  . Alcohol abuse    usually drinks 2-3 drinks per day  . Atherosclerosis 06/2018  . Chronic sinusitis   . Dehydration 02/07/2019  . Emphysema of lung ([place]) 06/2018   patient unaware of this.  . Hip fracture ([place]) 05/2018   no surgery  . History of kidney stones 05/2018   per xray, bilateral nephrolitiasis  . Hypertension   . Squamous cell carcinoma of lung, right ([place]) 06/2018    SURGICAL HISTORY: Past Surgical History:  Procedure Laterality Date  . BRAIN SURGERY  10/2017   nasal/sinus endoscopy. mass benign  . ELECTROMAGNETIC NAVIGATION BROCHOSCOPY Right 06/25/2018   Procedure: ELECTROMAGNETIC NAVIGATION BRONCHOSCOPY;  Surgeon: [name], MD;  Location: [facility] OR[facility];  Service:  Cardiopulmonary;  Laterality: Right;  . ENDOBRONCHIAL ULTRASOUND Right 06/25/2018   Procedure: ENDOBRONCHIAL ULTRASOUND;  Surgeon: [name], MD;  Location: [facility];  Service: Cardiopulmonary;  Laterality: Right;  . ENDOBRONCHIAL ULTRASOUND Right 12/26/2018   Procedure: ENDOBRONCHIAL ULTRASOUND RIGHT;  Surgeon: [name], MD;  Location: [facility];  Service: Cardiopulmonary;  Laterality: Right;  . FLEXIBLE BRONCHOSCOPY N/A 08/20/2018   Procedure: FLEXIBLE BRONCHOSCOPY PREOP;  Surgeon: [name], MD;  Location: [facility] OR[facility];  Service: Thoracic;  Laterality: N/A;  . IR CV LINE INJECTION  04/04/2019  . NASAL SINUS SURGERY  10/2017   At [facility] [address], frontal sinusotomy, ethmoidectomy, resection anterior cranial fossa neoplasm, turbinate resection  . PORTACATH PLACEMENT Left 01/15/2019   Procedure: INSERTION PORT-A-CATH;  Surgeon: [name], MD;  Location: [facility] OR[facility];  Service: General;  Laterality: Left;  . THORACOTOMY Right 08/13/2018   Procedure: PREOP BROCHOSCOPY WITH RIGHT THORACOTOMY AND RUL RESECTION;  Surgeon: [name], MD;  Location: [facility];  Service: General;  Laterality: Right;  . THORACOTOMY Right 08/20/2018   Procedure: THORACOTOMY MAJOR RIGHT UPPER LOBE LOBECTOMY;  Surgeon: [name], MD;  Location: [facility] OR[facility];  Service: Thoracic;  Laterality: Right;  . TOE SURGERY Left    pin in left toe    SOCIAL HISTORY: Social History   Socioeconomic History  . Marital status: Married    Spouse name: lisa  . Number of children: Not on file  . Years of education: Not on file  . Highest education level: Not on file  Occupational History  . Occupation: welding    Comment: taking time off to resolve issues  Tobacco Use  . Smoking status: Former Smoker    Packs/day: 0.50    Years: 15.00    Pack years: 7.50    Types: Cigarettes    Quit date: 08/2018    Years since quitting: 1.0  . Smokeless tobacco: Never Used  Substance and Sexual Activity  . Alcohol use: Yes    Alcohol/week: 3.0  standard drinks    Types: 3 Cans of beer per week    Comment: usually 2 drinks per week, per patient  . Drug use: No  . Sexual activity: Not on file  Other Topics Concern  . Not on file  Social History Narrative  . Not on file   Social Determinants of Health   Financial Resource Strain:   . Difficulty of Paying Living Expenses: Not on file  Food Insecurity:   . Worried About Charity fundraiser in the Last Year: Not on file  . Ran Out of Food in the Last Year: Not on file  Transportation Needs:   . Lack of Transportation (Medical): Not on file  . Lack of Transportation (Non-Medical): Not on file  Physical Activity:   . Days of Exercise per Week: Not on file  . Minutes of Exercise per Session: Not on file  Stress:   . Feeling of Stress : Not on file  Social Connections:   . Frequency of Communication with Friends and Family: Not on file  . Frequency of Social Gatherings with Friends and Family: Not on file  . Attends Religious Services: Not on file  . Active Member of Clubs or Organizations: Not on file  . Attends Archivist Meetings: Not on file  . Marital Status: Not on file  Intimate Partner Violence:   . Fear of Current or Ex-Partner: Not on file  . Emotionally Abused: Not on file  . Physically Abused: Not on file  .  Sexually Abused: Not on file    FAMILY HISTORY: Family History  Problem Relation Age of Onset  . Breast cancer Mother   . Diabetes Mother   . Lung cancer Father   . Hypertension Father     ALLERGIES:  has No Known Allergies.  MEDICATIONS:  Current Outpatient Medications  Medication Sig Dispense Refill  . albuterol (VENTOLIN HFA) 108 (90 Base) MCG/ACT inhaler Inhale 2 puffs into the lungs every 6 (six) hours as needed for wheezing or shortness of breath. 8 g 6  . cloNIDine (CATAPRES) 0.1 MG tablet Take 0.1 mg by mouth daily.   5  . diltiazem (CARDIZEM CD) 120 MG 24 hr capsule Take 120 mg by mouth daily.    . folic acid (V-R FOLIC ACID)  259 MCG tablet Take 1 tablet (400 mcg total) by mouth daily. 90 tablet 1  . hydrALAZINE (APRESOLINE) 100 MG tablet Take 50 mg by mouth 2 (two) times daily.     . hydrochlorothiazide (HYDRODIURIL) 12.5 MG tablet Take 12.5 mg by mouth daily.     Marland Kitchen KLOR-CON M20 20 MEQ tablet Take 1 tablet (20 mEq total) by mouth 2 (two) times daily. 60 tablet 0  . lidocaine-prilocaine (EMLA) cream Apply to affected area once 30 g 3  . magnesium chloride (SLOW-MAG) 64 MG TBEC SR tablet TAKE 1 TABLET (64 MG TOTAL) BY MOUTH DAILY. 90 tablet 0  . methocarbamol (ROBAXIN) 500 MG tablet Take 1 tablet (500 mg total) by mouth at bedtime. 30 tablet 0  . olmesartan (BENICAR) 40 MG tablet Take 40 mg by mouth every other day.     . ondansetron (ZOFRAN) 8 MG tablet Take 1 tablet (8 mg total) by mouth 2 (two) times daily as needed for refractory nausea / vomiting. Start on day 3 after chemo. 30 tablet 1  . oxyCODONE-acetaminophen (PERCOCET) 5-325 MG tablet Take 1-2 tablets by mouth every 4 (four) hours as needed for severe pain. 20 tablet 0  . prochlorperazine (COMPAZINE) 10 MG tablet Take 1 tablet (10 mg total) by mouth every 6 (six) hours as needed (Nausea or vomiting). 30 tablet 1  . vitamin B-12 (CYANOCOBALAMIN) 1000 MCG tablet Take 1 tablet (1,000 mcg total) by mouth daily. 90 tablet 1   No current facility-administered medications for this visit.   Facility-Administered Medications Ordered in Other Visits  Medication Dose Route Frequency Provider Last Rate Last Admin  . albuterol (PROVENTIL) (2.5 MG/3ML) 0.083% nebulizer solution 2.5 mg  2.5 mg Nebulization Once System, Provider Not In      . alteplase (CATHFLO ACTIVASE) injection 2 mg  2 mg Intracatheter Once PRN Earlie Server, MD      . durvalumab (IMFINZI) 620 mg in sodium chloride 0.9 % 100 mL chemo infusion  620 mg Intravenous Once Earlie Server, MD 112 mL/hr at 09/15/19 1019 620 mg at 09/15/19 1019  . heparin lock flush 100 unit/mL  500 Units Intravenous Once Earlie Server, MD        . heparin lock flush 100 unit/mL  500 Units Intracatheter Once PRN Earlie Server, MD      . heparin lock flush 100 unit/mL  250 Units Intracatheter Once PRN Earlie Server, MD      . sodium chloride flush (NS) 0.9 % injection 10 mL  10 mL Intravenous PRN Earlie Server, MD   10 mL at 04/04/19 0903  . sodium chloride flush (NS) 0.9 % injection 10 mL  10 mL Intracatheter PRN Earlie Server, MD      .  sodium chloride flush (NS) 0.9 % injection 3 mL  3 mL Intracatheter PRN Earlie Server, MD         PHYSICAL EXAMINATION: ECOG PERFORMANCE STATUS: 0 - Asymptomatic Vitals:   09/15/19 0903 09/15/19 0904  BP: (!) 189/108 (!) 170/97  Pulse: (!) 103 100  Temp: 97.7 F (36.5 C)   SpO2: 96%    Filed Weights   09/15/19 0903  Weight: 147 lb 11.2 oz (67 kg)    Physical Exam Constitutional:      General: He is not in acute distress. HENT:     Head: Normocephalic and atraumatic.  Eyes:     General: No scleral icterus. Cardiovascular:     Rate and Rhythm: Normal rate and regular rhythm.     Heart sounds: Normal heart sounds.  Pulmonary:     Effort: Pulmonary effort is normal. No respiratory distress.     Breath sounds: No wheezing.  Abdominal:     General: Bowel sounds are normal. There is no distension.     Palpations: Abdomen is soft.  Musculoskeletal:        General: No deformity. Normal range of motion.     Cervical back: Normal range of motion and neck supple.  Skin:    General: Skin is warm and dry.     Findings: No erythema or rash.  Neurological:     Mental Status: He is alert and oriented to person, place, and time. Mental status is at baseline.     Cranial Nerves: No cranial nerve deficit.     Coordination: Coordination normal.  Psychiatric:        Mood and Affect: Mood normal.      LABORATORY DATA:  I have reviewed the data as listed Lab Results  Component Value Date   WBC 8.3 09/15/2019   HGB 13.1 09/15/2019   HCT 38.4 (L) 09/15/2019   MCV 105.8 (H) 09/15/2019   PLT 153 09/15/2019    Recent Labs    08/18/19 0901 09/01/19 0856 09/15/19 0837  NA 135 138 141  K 4.0 3.0* 3.8  CL 105 104 106  CO2 20* 22 24  GLUCOSE 120* 146* 111*  BUN _0 CREATININE 0.89 0.87 1.00  CALCIUM 9.4 8.8* 8.8*  GFRNONAA >60 >60 >60  GFRAA >60 >60 >60  PROT 7.4 6.8 7.2  ALBUMIN 3.8 3.4* 3.6  AST 33 33 43*  ALT _1 ALKPHOS 76 83 80  BILITOT 0.5 0.8 0.4   Iron/TIBC/Ferritin/ %Sat No results found for: IRON, TIBC, FERRITIN, IRONPCTSAT   Pathology 12/26/2018 Right hilar adenopathy/mass-EUS guided FNA Positive for malignancy, non small cell carcinoma, morphologically similar to previous right upper lobe squamous cell carcinoma.   ASSESSMENT & PLAN:  1. Squamous cell carcinoma of lung, right ([place])   2. Encounter for antineoplastic immunotherapy   3. Macrocytosis without anemia   4. Uncontrolled hypertension    #Recurrent Squamous cell carcinoma of lung,  Patient tolerates immunotherapy maintenance well. Labs are reviewed and discussed with patient. Counts acceptable to proceed with immunotherapy maintenance durvalumab.  #Macrocytosis, Vitamin B12 deficiency on vitamin B12 supplementation. Continue.  # Folic deficiency, continue folic acid supplementation.  #uncontrolled hypertension, chronic issue for him. He reports being compliant to his HTN medication.  He has monitored his BP at home and has been within normal limites.  Continue monitor.;  Hypomagnesium, magnesium is decreased at 1.7.  continue magnesium supplements. Hypokalemia, potassium 3.8, .  Continue potassium chloride 20 mEq twice daily.  Return of visit: 2 weeks.    Earlie Server, MD, PhD Hematology Oncology [facility] technician smear is a possible [facility] 09/15/2019

## 2019-09-15 NOTE — Progress Notes (Signed)
BP elevated. Per Dr Tasia Catchings okay to proceed with treatment

## 2019-09-15 NOTE — Progress Notes (Signed)
Patient does not offer any problems today.  

## 2019-09-15 NOTE — Telephone Encounter (Signed)
Patient left a written note with info for critical illness insurance.  The web site ([email or web address]) does note pull up as an active web site.    Called patient asking him to contact the insurance company to initiate a claim.

## 2019-09-22 ENCOUNTER — Telehealth: Payer: Self-pay

## 2019-09-22 NOTE — Telephone Encounter (Signed)
Medical section filled out and MD signed on the Critical illness form from [facility].  Patient says he will get the original form along with print out of path report at his next appt on 3/11.  Advised pt he needs to complete sections 1 & 2 on form before submitting.

## 2019-09-23 ENCOUNTER — Other Ambulatory Visit: Payer: Self-pay | Admitting: Oncology

## 2019-09-23 NOTE — Telephone Encounter (Signed)
...    Ref Range & Units 8 d ago  Potassium 3.5 - 5.1 mmol/L 3.8

## 2019-09-26 ENCOUNTER — Encounter: Payer: Self-pay | Admitting: Oncology

## 2019-09-26 NOTE — Progress Notes (Signed)
Patient is coming in for follow up he is doing well no complaints or concerns

## 2019-09-29 ENCOUNTER — Inpatient Hospital Stay: Payer: Managed Care, Other (non HMO)

## 2019-09-29 ENCOUNTER — Inpatient Hospital Stay (HOSPITAL_BASED_OUTPATIENT_CLINIC_OR_DEPARTMENT_OTHER): Payer: Managed Care, Other (non HMO) | Admitting: Oncology

## 2019-09-29 ENCOUNTER — Other Ambulatory Visit: Payer: Self-pay | Admitting: *Deleted

## 2019-09-29 ENCOUNTER — Other Ambulatory Visit: Payer: Self-pay

## 2019-09-29 VITALS — BP 165/93 | HR 100 | Temp 97.3°F | Resp 18 | Wt 146.4 lb

## 2019-09-29 DIAGNOSIS — D696 Thrombocytopenia, unspecified: Secondary | ICD-10-CM

## 2019-09-29 DIAGNOSIS — C3491 Malignant neoplasm of unspecified part of right bronchus or lung: Secondary | ICD-10-CM

## 2019-09-29 DIAGNOSIS — E860 Dehydration: Secondary | ICD-10-CM

## 2019-09-29 DIAGNOSIS — I10 Essential (primary) hypertension: Secondary | ICD-10-CM

## 2019-09-29 DIAGNOSIS — D7589 Other specified diseases of blood and blood-forming organs: Secondary | ICD-10-CM

## 2019-09-29 DIAGNOSIS — Z95828 Presence of other vascular implants and grafts: Secondary | ICD-10-CM

## 2019-09-29 DIAGNOSIS — E876 Hypokalemia: Secondary | ICD-10-CM

## 2019-09-29 DIAGNOSIS — Z5112 Encounter for antineoplastic immunotherapy: Secondary | ICD-10-CM | POA: Diagnosis not present

## 2019-09-29 LAB — COMPREHENSIVE METABOLIC PANEL
ALT: 28 U/L (ref 0–44)
AST: 40 U/L (ref 15–41)
Albumin: 3.7 g/dL (ref 3.5–5.0)
Alkaline Phosphatase: 89 U/L (ref 38–126)
Anion gap: 11 (ref 5–15)
BUN: 11 mg/dL (ref 6–20)
CO2: 22 mmol/L (ref 22–32)
Calcium: 9 mg/dL (ref 8.9–10.3)
Chloride: 105 mmol/L (ref 98–111)
Creatinine, Ser: 0.89 mg/dL (ref 0.61–1.24)
GFR calc Af Amer: >60 mL/min (ref >60)
GFR calc non Af Amer: >60 mL/min (ref >60)
Glucose, Bld: 116 mg/dL — ABNORMAL HIGH (ref 70–99)
Potassium: 3.1 mmol/L — ABNORMAL LOW (ref 3.5–5.1)
Sodium: 138 mmol/L (ref 135–145)
Total Bilirubin: 0.6 mg/dL (ref 0.3–1.2)
Total Protein: 7.3 g/dL (ref 6.5–8.1)

## 2019-09-29 LAB — MAGNESIUM: Magnesium: 1.6 mg/dL — ABNORMAL LOW (ref 1.7–2.4)

## 2019-09-29 LAB — CBC WITH DIFFERENTIAL/PLATELET
Abs Immature Granulocytes: 0.02 10*3/uL (ref 0.00–0.07)
Basophils Absolute: 0.1 10*3/uL (ref 0.0–0.1)
Basophils Relative: 1 %
Eosinophils Absolute: 0.1 10*3/uL (ref 0.0–0.5)
Eosinophils Relative: 1 %
HCT: 41.8 % (ref 39.0–52.0)
Hemoglobin: 14.7 g/dL (ref 13.0–17.0)
Immature Granulocytes: 0 %
Lymphocytes Relative: 9 %
Lymphs Abs: 1 10*3/uL (ref 0.7–4.0)
MCH: 36.1 pg — ABNORMAL HIGH (ref 26.0–34.0)
MCHC: 35.2 g/dL (ref 30.0–36.0)
MCV: 102.7 fL — ABNORMAL HIGH (ref 80.0–100.0)
Monocytes Absolute: 0.9 10*3/uL (ref 0.1–1.0)
Monocytes Relative: 8 %
Neutro Abs: 8.9 10*3/uL — ABNORMAL HIGH (ref 1.7–7.7)
Neutrophils Relative %: 81 %
Platelets: 142 10*3/uL — ABNORMAL LOW (ref 150–400)
RBC: 4.07 MIL/uL — ABNORMAL LOW (ref 4.22–5.81)
RDW: 13 % (ref 11.5–15.5)
WBC: 10.9 10*3/uL — ABNORMAL HIGH (ref 4.0–10.5)
nRBC: 0 % (ref 0.0–0.2)

## 2019-09-29 LAB — TSH: TSH: 0.778 u[IU]/mL (ref 0.350–4.500)

## 2019-09-29 MED ORDER — MAGNESIUM SULFATE 2 GM/50ML IV SOLN
2.0000 g | Freq: Once | INTRAVENOUS | Status: AC
  Administered 2019-09-29: 2 g via INTRAVENOUS
  Filled 2019-09-29: qty 50

## 2019-09-29 MED ORDER — SODIUM CHLORIDE 0.9 % IV SOLN
Freq: Once | INTRAVENOUS | Status: AC
  Filled 2019-09-29: qty 250

## 2019-09-29 MED ORDER — SODIUM CHLORIDE 0.9 % IV SOLN
10.0000 mg/kg | Freq: Once | INTRAVENOUS | Status: AC
  Administered 2019-09-29: 620 mg via INTRAVENOUS
  Filled 2019-09-29: qty 10

## 2019-09-29 MED ORDER — HEPARIN SOD (PORK) LOCK FLUSH 100 UNIT/ML IV SOLN
500.0000 [IU] | Freq: Once | INTRAVENOUS | Status: AC | PRN
  Administered 2019-09-29: 500 [IU]
  Filled 2019-09-29: qty 5

## 2019-09-29 MED ORDER — SODIUM CHLORIDE 0.9 % IV SOLN
Freq: Once | INTRAVENOUS | Status: DC
  Filled 2019-09-29: qty 250

## 2019-09-29 MED ORDER — SODIUM CHLORIDE 0.9 % IV SOLN: 2.0000 g | Freq: Once | INTRAVENOUS | Status: DC

## 2019-09-29 MED ORDER — HEPARIN SOD (PORK) LOCK FLUSH 100 UNIT/ML IV SOLN
INTRAVENOUS | Status: AC
  Filled 2019-09-29: qty 5

## 2019-09-29 NOTE — Progress Notes (Signed)
Hematology/Oncology Follow up note [facility] [phone] [phone]   Patient Care Team: Tracie Harrier, MD as PCP - General (Internal Medicine) Earlie Server, MD as Consulting Physician (Hematology and Oncology)  REASON FOR VISIT:  Follow up for treatment of squamous lung cancer.   HISTORY OF PRESENTING ILLNESS:  # Dec 2019 Stage I squamous lung cancer #s/p Bronchoscopy on 06/25/2018. subcarina EBUS FNA was non diagnostic, hypocellular specimen.  # 08/13/2018 CT guided right upper lobe biopsy pathology showed dense fibrosis and mixed inflammatory cells with prominent polytypic plasma cells component. Focal benign bronchial wall and alveolar spaces. No malignancy was identified.   # 08/13/2018 underwent right thoracotomy and wedge resection of a right upper lobe mass.  Frozen section was consistent with an inflammatory process.  On the second postop day, preliminary pathology reports high-grade malignancy.  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  08/20/2018 Patient was therefore taken back to the OR and underwent take complete lobectomy  pT1b pN0 cM0 stage I squamous cell lung cancer.  Margin is negative.  Recommend observation.    # 12/03/2018 CT chest w contrast showed local recurrence.  12/10/2018 PET showed No evidence of distant metastatic disease # 12/26/2018 s/p bronchoscopy biopsy. Confirmed local recurrence of squamous cell lung cancer.   medi port placed by Dr.Oaks.  Cancer treatment Started concurrent chemoradiation on 01/16/2019 Carboplatin AUC of 2 and Taxol 45 mg/m2 weekly finished in 03/05/2019.   INTERVAL HISTORY Samuel Choi is a 54 y.o. male who has above history reviewed by me today presents for evaluation prior to immunotherapy for treatment of  recurrent  squamous cell lung cancer. Patient reports feeling well today. No new complaints.  Denies, diarrhea, or skin rash   Review of Systems  Constitutional: Positive  for fatigue. Negative for appetite change, chills, fever and unexpected weight change.  HENT:   Negative for hearing loss and voice change.   Eyes: Negative for eye problems and icterus.  Respiratory: Negative for chest tightness, cough and shortness of breath.   Cardiovascular: Negative for chest pain and leg swelling.  Gastrointestinal: Negative for abdominal distention and abdominal pain.  Endocrine: Negative for hot flashes.  Genitourinary: Negative for difficulty urinating, dysuria and frequency.   Musculoskeletal: Negative for arthralgias.  Skin: Negative for itching and rash.  Neurological: Negative for light-headedness and numbness.  Hematological: Negative for adenopathy. Does not bruise/bleed easily.  Psychiatric/Behavioral: Negative for confusion.    MEDICAL HISTORY:  Past Medical History:  Diagnosis Date  . Alcohol abuse    usually drinks 2-3 drinks per day  . Atherosclerosis 06/2018  . Chronic sinusitis   . Dehydration 02/07/2019  . Emphysema of lung ([place]) 06/2018   patient unaware of this.  . Hip fracture ([place]) 05/2018   no surgery  . History of kidney stones 05/2018   per xray, bilateral nephrolitiasis  . Hypertension   . Squamous cell carcinoma of lung, right ([place]) 06/2018    SURGICAL HISTORY: Past Surgical History:  Procedure Laterality Date  . BRAIN SURGERY  10/2017   nasal/sinus endoscopy. mass benign  . ELECTROMAGNETIC NAVIGATION BROCHOSCOPY Right 06/25/2018   Procedure: ELECTROMAGNETIC NAVIGATION BRONCHOSCOPY;  Surgeon: [name], MD;  Location: [facility] ORS;  Service: Cardiopulmonary;  Laterality: Right;  . ENDOBRONCHIAL ULTRASOUND Right 06/25/2018   Procedure: ENDOBRONCHIAL ULTRASOUND;  Surgeon: [name], MD;  Location: [facility] OR[facility];  Service: Cardiopulmonary;  Laterality: Right;  . ENDOBRONCHIAL ULTRASOUND Right 12/26/2018   Procedure: ENDOBRONCHIAL ULTRASOUND RIGHT;  Surgeon: [name], MD;  Location: [facility] OR[facility];  Service: Cardiopulmonary;   Laterality: Right;  . FLEXIBLE BRONCHOSCOPY N/A 08/20/2018   Procedure: FLEXIBLE BRONCHOSCOPY PREOP;  Surgeon: [name], MD;  Location: [facility] ORS;  Service: Thoracic;  Laterality: N/A;  . IR CV LINE INJECTION  04/04/2019  . NASAL SINUS SURGERY  10/2017   At [facility], frontal sinusotomy, ethmoidectomy, resection anterior cranial fossa neoplasm, turbinate resection  . PORTACATH PLACEMENT Left 01/15/2019   Procedure: INSERTION PORT-A-CATH;  Surgeon: [name], MD;  Location: [facility] OR[facility];  Service: General;  Laterality: Left;  . THORACOTOMY Right 08/13/2018   Procedure: PREOP BROCHOSCOPY WITH RIGHT THORACOTOMY AND RUL RESECTION;  Surgeon: [name], MD;  Location: [facility] OR[facility];  Service: General;  Laterality: Right;  . THORACOTOMY Right 08/20/2018   Procedure: THORACOTOMY MAJOR RIGHT UPPER LOBE LOBECTOMY;  Surgeon: [name], MD;  Location: [facility] ORS;  Service: Thoracic;  Laterality: Right;  . TOE SURGERY Left    pin in left toe    SOCIAL HISTORY: Social History   Socioeconomic History  . Marital status: Married    Spouse name: lisa  . Number of children: Not on file  . Years of education: Not on file  . Highest education level: Not on file  Occupational History  . Occupation: welding    Comment: taking time off to resolve issues  Tobacco Use  . Smoking status: Former Smoker    Packs/day: 0.50    Years: 15.00    Pack years: 7.50    Types: Cigarettes    Quit date: 08/2018    Years since quitting: 1.1  . Smokeless tobacco: Never Used  Substance and Sexual Activity  . Alcohol use: Yes    Alcohol/week: 3.0 standard drinks    Types: 3 Cans of beer per week    Comment: usually 2 drinks per week, per patient  . Drug use: No  . Sexual activity: Not on file  Other Topics Concern  . Not on file  Social History Narrative  . Not on file   Social Determinants of Health   Financial Resource Strain:   . Difficulty of Paying Living Expenses:   Food Insecurity:   . Worried About Paediatric nurse in the Last Year:   . Arboriculturist in the Last Year:   Transportation Needs:   . Film/video editor (Medical):   Marland Kitchen Lack of Transportation (Non-Medical):   Physical Activity:   . Days of Exercise per Week:   . Minutes of Exercise per Session:   Stress:   . Feeling of Stress :   Social Connections:   . Frequency of Communication with Friends and Family:   . Frequency of Social Gatherings with Friends and Family:   . Attends Religious Services:   . Active Member of Clubs or Organizations:   . Attends Archivist Meetings:   Marland Kitchen Marital Status:   Intimate Partner Violence:   . Fear of Current or Ex-Partner:   . Emotionally Abused:   Marland Kitchen Physically Abused:   . Sexually Abused:     FAMILY HISTORY: Family History  Problem Relation Age of Onset  . Breast cancer Mother   . Diabetes Mother   . Lung cancer Father   . Hypertension Father     ALLERGIES:  has No Known Allergies.  MEDICATIONS:  Current Outpatient Medications  Medication Sig Dispense Refill  . albuterol (VENTOLIN HFA) 108 (90 Base) MCG/ACT inhaler Inhale 2 puffs into the lungs every 6 (six) hours as needed for  wheezing or shortness of breath. 8 g 6  . cloNIDine (CATAPRES) 0.1 MG tablet Take 0.1 mg by mouth daily.   5  . diltiazem (CARDIZEM CD) 120 MG 24 hr capsule Take 120 mg by mouth daily.    . folic acid (V-R FOLIC ACID) 147 MCG tablet Take 1 tablet (400 mcg total) by mouth daily. 90 tablet 1  . hydrALAZINE (APRESOLINE) 100 MG tablet Take 50 mg by mouth 2 (two) times daily.     . hydrochlorothiazide (HYDRODIURIL) 12.5 MG tablet Take 12.5 mg by mouth daily.     Marland Kitchen KLOR-CON M20 20 MEQ tablet Take 1 tablet (20 mEq total) by mouth 2 (two) times daily. 60 tablet 0  . latanoprost (XALATAN) 0.005 % ophthalmic solution 1 drop at bedtime.    . lidocaine-prilocaine (EMLA) cream Apply to affected area once 30 g 3  . magnesium chloride (SLOW-MAG) 64 MG TBEC SR tablet TAKE 1 TABLET (64 MG TOTAL) BY MOUTH  DAILY. 90 tablet 0  . methocarbamol (ROBAXIN) 500 MG tablet Take 1 tablet (500 mg total) by mouth at bedtime. 30 tablet 0  . olmesartan (BENICAR) 40 MG tablet Take 40 mg by mouth every other day.     . ondansetron (ZOFRAN) 8 MG tablet Take 1 tablet (8 mg total) by mouth 2 (two) times daily as needed for refractory nausea / vomiting. Start on day 3 after chemo. 30 tablet 1  . oxyCODONE-acetaminophen (PERCOCET) 5-325 MG tablet Take 1-2 tablets by mouth every 4 (four) hours as needed for severe pain. 20 tablet 0  . prochlorperazine (COMPAZINE) 10 MG tablet Take 1 tablet (10 mg total) by mouth every 6 (six) hours as needed (Nausea or vomiting). 30 tablet 1  . vitamin B-12 (CYANOCOBALAMIN) 1000 MCG tablet Take 1 tablet (1,000 mcg total) by mouth daily. 90 tablet 1   No current facility-administered medications for this visit.   Facility-Administered Medications Ordered in Other Visits  Medication Dose Route Frequency Provider Last Rate Last Admin  . albuterol (PROVENTIL) (2.5 MG/3ML) 0.083% nebulizer solution 2.5 mg  2.5 mg Nebulization Once System, Provider Not In      . heparin lock flush 100 unit/mL  500 Units Intravenous Once Earlie Server, MD      . sodium chloride flush (NS) 0.9 % injection 10 mL  10 mL Intravenous PRN Earlie Server, MD   10 mL at 04/04/19 0903     PHYSICAL EXAMINATION: ECOG PERFORMANCE STATUS: 0 - Asymptomatic Vitals:   09/26/19 0945 09/29/19 1034  BP: (!) 165/93 (!) 165/93  Pulse: 100 100  Resp: 18 18  Temp: (!) 97.3 F (36.3 C) (!) 97.3 F (36.3 C)  SpO2: 95% 95%   Filed Weights   09/26/19 0945 09/29/19 1034  Weight: 146 lb 6.4 oz (66.4 kg) 146 lb 6.4 oz (66.4 kg)    Physical Exam Constitutional:      General: He is not in acute distress. HENT:     Head: Normocephalic and atraumatic.  Eyes:     General: No scleral icterus. Cardiovascular:     Rate and Rhythm: Normal rate and regular rhythm.     Heart sounds: Normal heart sounds.  Pulmonary:     Effort:  Pulmonary effort is normal. No respiratory distress.     Breath sounds: No wheezing.  Abdominal:     General: Bowel sounds are normal. There is no distension.     Palpations: Abdomen is soft.  Musculoskeletal:        General: No  deformity. Normal range of motion.     Cervical back: Normal range of motion and neck supple.  Skin:    General: Skin is warm and dry.     Findings: No erythema or rash.  Neurological:     Mental Status: He is alert and oriented to person, place, and time. Mental status is at baseline.     Cranial Nerves: No cranial nerve deficit.     Coordination: Coordination normal.  Psychiatric:        Mood and Affect: Mood normal.      LABORATORY DATA:  I have reviewed the data as listed Lab Results  Component Value Date   WBC 10.9 (H) 09/29/2019   HGB 14.7 09/29/2019   HCT 41.8 09/29/2019   MCV 102.7 (H) 09/29/2019   PLT 142 (L) 09/29/2019   Recent Labs    09/01/19 0856 09/15/19 0837 09/29/19 0905  NA 138 141 138  K 3.0* 3.8 3.1*  CL 104 106 105  CO2 _0 GLUCOSE 146* 111* 116*  BUN _1 CREATININE 0.87 1.00 0.89  CALCIUM 8.8* 8.8* 9.0  GFRNONAA >60 >60 >60  GFRAA >60 >60 >60  PROT 6.8 7.2 7.3  ALBUMIN 3.4* 3.6 3.7  AST 33 43* 40  ALT _2 ALKPHOS 83 80 89  BILITOT 0.8 0.4 0.6   Iron/TIBC/Ferritin/ %Sat No results found for: IRON, TIBC, FERRITIN, IRONPCTSAT   Pathology 12/26/2018 Right hilar adenopathy/mass-EUS guided FNA Positive for malignancy, non small cell carcinoma, morphologically similar to previous right upper lobe squamous cell carcinoma.   ASSESSMENT & PLAN:  1. Squamous cell carcinoma of lung, right ([place])   2. Encounter for antineoplastic immunotherapy   3. Macrocytosis without anemia   4. Uncontrolled hypertension   5. Hypomagnesemia   6. Hypokalemia    #Recurrent Squamous cell carcinoma of lung,  Currently on maintenance immunotherapy with durvalumab every 2 weeks. So far patient has tolerated  immunotherapy very well. No significant immunotherapy related toxicities. Counts acceptable to proceed with today's durvalumab treatment. TSH has been monitored and has been stable  #Macrocytosis, Vitamin B12 deficiency on vitamin B12 supplementation. Continue.  # Folic deficiency,continue  folic acid supplementation.  #uncontrolled hypertension, chronic issue for him. Monitor.  # Hypomagnesium Magnesium at 1.6, will give him IV magnesium 2g x 1.  # Hypokalemia, K is 3.1 today. Advise patient tocontinue  potassium chloride 20 mEq twice daily.    Return of visit: 2 weeks.    Earlie Server, MD, PhD Hematology Oncology [facility] technician smear is a possible [facility] 09/29/2019

## 2019-10-03 NOTE — Progress Notes (Signed)
Pharmacist Chemotherapy Monitoring - Follow Up Assessment    I verify that I have reviewed each item in the below checklist:  . Regimen for the patient is scheduled for the appropriate day and plan matches scheduled date. Marland Kitchen Appropriate non-routine labs are ordered dependent on drug ordered. . If applicable, additional medications reviewed and ordered per protocol based on lifetime cumulative doses and/or treatment regimen.   Plan for follow-up and/or issues identified: No . I-vent associated with next due treatment: No . MD and/or nursing notified: No  Liller Yohn K 10/03/2019 9:00 AM

## 2019-10-13 ENCOUNTER — Other Ambulatory Visit: Payer: Self-pay

## 2019-10-13 ENCOUNTER — Inpatient Hospital Stay: Payer: Managed Care, Other (non HMO)

## 2019-10-13 ENCOUNTER — Encounter: Payer: Self-pay | Admitting: Oncology

## 2019-10-13 ENCOUNTER — Inpatient Hospital Stay: Payer: Managed Care, Other (non HMO) | Attending: Oncology

## 2019-10-13 ENCOUNTER — Inpatient Hospital Stay (HOSPITAL_BASED_OUTPATIENT_CLINIC_OR_DEPARTMENT_OTHER): Payer: Managed Care, Other (non HMO) | Admitting: Oncology

## 2019-10-13 VITALS — BP 165/98 | HR 59 | Temp 98.3°F | Resp 18 | Wt 143.6 lb

## 2019-10-13 DIAGNOSIS — Z5112 Encounter for antineoplastic immunotherapy: Secondary | ICD-10-CM | POA: Insufficient documentation

## 2019-10-13 DIAGNOSIS — Z833 Family history of diabetes mellitus: Secondary | ICD-10-CM | POA: Insufficient documentation

## 2019-10-13 DIAGNOSIS — Z87891 Personal history of nicotine dependence: Secondary | ICD-10-CM | POA: Diagnosis not present

## 2019-10-13 DIAGNOSIS — Z79899 Other long term (current) drug therapy: Secondary | ICD-10-CM | POA: Insufficient documentation

## 2019-10-13 DIAGNOSIS — J439 Emphysema, unspecified: Secondary | ICD-10-CM | POA: Diagnosis not present

## 2019-10-13 DIAGNOSIS — E538 Deficiency of other specified B group vitamins: Secondary | ICD-10-CM | POA: Diagnosis not present

## 2019-10-13 DIAGNOSIS — C3491 Malignant neoplasm of unspecified part of right bronchus or lung: Secondary | ICD-10-CM

## 2019-10-13 DIAGNOSIS — E876 Hypokalemia: Secondary | ICD-10-CM | POA: Diagnosis not present

## 2019-10-13 DIAGNOSIS — I10 Essential (primary) hypertension: Secondary | ICD-10-CM

## 2019-10-13 DIAGNOSIS — D7589 Other specified diseases of blood and blood-forming organs: Secondary | ICD-10-CM | POA: Diagnosis not present

## 2019-10-13 DIAGNOSIS — Z8249 Family history of ischemic heart disease and other diseases of the circulatory system: Secondary | ICD-10-CM | POA: Diagnosis not present

## 2019-10-13 DIAGNOSIS — Z803 Family history of malignant neoplasm of breast: Secondary | ICD-10-CM | POA: Diagnosis not present

## 2019-10-13 DIAGNOSIS — D696 Thrombocytopenia, unspecified: Secondary | ICD-10-CM

## 2019-10-13 DIAGNOSIS — E860 Dehydration: Secondary | ICD-10-CM

## 2019-10-13 DIAGNOSIS — C3411 Malignant neoplasm of upper lobe, right bronchus or lung: Secondary | ICD-10-CM | POA: Diagnosis present

## 2019-10-13 DIAGNOSIS — Z801 Family history of malignant neoplasm of trachea, bronchus and lung: Secondary | ICD-10-CM | POA: Diagnosis not present

## 2019-10-13 DIAGNOSIS — Z95828 Presence of other vascular implants and grafts: Secondary | ICD-10-CM

## 2019-10-13 LAB — COMPREHENSIVE METABOLIC PANEL
ALT: 26 U/L (ref 0–44)
AST: 29 U/L (ref 15–41)
Albumin: 3.9 g/dL (ref 3.5–5.0)
Alkaline Phosphatase: 91 U/L (ref 38–126)
Anion gap: 11 (ref 5–15)
BUN: 12 mg/dL (ref 6–20)
CO2: 24 mmol/L (ref 22–32)
Calcium: 9.2 mg/dL (ref 8.9–10.3)
Chloride: 101 mmol/L (ref 98–111)
Creatinine, Ser: 0.99 mg/dL (ref 0.61–1.24)
GFR calc Af Amer: >60 mL/min (ref >60)
GFR calc non Af Amer: >60 mL/min (ref >60)
Glucose, Bld: 147 mg/dL — ABNORMAL HIGH (ref 70–99)
Potassium: 3.9 mmol/L (ref 3.5–5.1)
Sodium: 136 mmol/L (ref 135–145)
Total Bilirubin: 0.7 mg/dL (ref 0.3–1.2)
Total Protein: 7.5 g/dL (ref 6.5–8.1)

## 2019-10-13 LAB — CBC WITH DIFFERENTIAL/PLATELET
Abs Immature Granulocytes: 0.05 10*3/uL (ref 0.00–0.07)
Basophils Absolute: 0.1 10*3/uL (ref 0.0–0.1)
Basophils Relative: 1 %
Eosinophils Absolute: 0.1 10*3/uL (ref 0.0–0.5)
Eosinophils Relative: 1 %
HCT: 40.1 % (ref 39.0–52.0)
Hemoglobin: 14.3 g/dL (ref 13.0–17.0)
Immature Granulocytes: 1 %
Lymphocytes Relative: 8 %
Lymphs Abs: 0.8 10*3/uL (ref 0.7–4.0)
MCH: 36.5 pg — ABNORMAL HIGH (ref 26.0–34.0)
MCHC: 35.7 g/dL (ref 30.0–36.0)
MCV: 102.3 fL — ABNORMAL HIGH (ref 80.0–100.0)
Monocytes Absolute: 1 10*3/uL (ref 0.1–1.0)
Monocytes Relative: 9 %
Neutro Abs: 8.5 10*3/uL — ABNORMAL HIGH (ref 1.7–7.7)
Neutrophils Relative %: 80 %
Platelets: 176 10*3/uL (ref 150–400)
RBC: 3.92 MIL/uL — ABNORMAL LOW (ref 4.22–5.81)
RDW: 12 % (ref 11.5–15.5)
WBC: 10.6 10*3/uL — ABNORMAL HIGH (ref 4.0–10.5)
nRBC: 0 % (ref 0.0–0.2)

## 2019-10-13 LAB — MAGNESIUM: Magnesium: 1.6 mg/dL — ABNORMAL LOW (ref 1.7–2.4)

## 2019-10-13 MED ORDER — SODIUM CHLORIDE 0.9% FLUSH
10.0000 mL | Freq: Once | INTRAVENOUS | Status: AC
  Administered 2019-10-13: 10 mL via INTRAVENOUS
  Filled 2019-10-13: qty 10

## 2019-10-13 MED ORDER — HEPARIN SOD (PORK) LOCK FLUSH 100 UNIT/ML IV SOLN
INTRAVENOUS | Status: AC
  Filled 2019-10-13: qty 5

## 2019-10-13 MED ORDER — HEPARIN SOD (PORK) LOCK FLUSH 100 UNIT/ML IV SOLN
500.0000 [IU] | Freq: Once | INTRAVENOUS | Status: AC | PRN
  Administered 2019-10-13: 500 [IU]
  Filled 2019-10-13: qty 5

## 2019-10-13 MED ORDER — SODIUM CHLORIDE 0.9 % IV SOLN
10.0000 mg/kg | Freq: Once | INTRAVENOUS | Status: AC
  Administered 2019-10-13: 620 mg via INTRAVENOUS
  Filled 2019-10-13: qty 10

## 2019-10-13 MED ORDER — MAGNESIUM SULFATE 2 GM/50ML IV SOLN
2.0000 g | Freq: Once | INTRAVENOUS | Status: AC
  Administered 2019-10-13: 2 g via INTRAVENOUS
  Filled 2019-10-13: qty 50

## 2019-10-13 MED ORDER — SODIUM CHLORIDE 0.9 % IV SOLN
Freq: Once | INTRAVENOUS | Status: AC
  Filled 2019-10-13: qty 250

## 2019-10-13 NOTE — Progress Notes (Signed)
Per Vallery Ridge, MD okay with proceeding with treatment today and adding 2g Mag to treatment. Treatment team and pt updated.   Rachid Parham [employer]

## 2019-10-13 NOTE — Progress Notes (Signed)
Patient here for follow up. Reports pain 9/10 to right leg. Unsure of what is causing pain exactly, he thinks he hit it.

## 2019-10-13 NOTE — Progress Notes (Signed)
Hematology/Oncology Follow up note [facility] [phone] [phone]   Patient Care Team: Tracie Harrier, MD as PCP - General (Internal Medicine) Earlie Server, MD as Consulting Physician (Hematology and Oncology)  REASON FOR VISIT:  Follow up for treatment of squamous lung cancer.   HISTORY OF PRESENTING ILLNESS:  # Dec 2019 Stage I squamous lung cancer #s/p Bronchoscopy on 06/25/2018. subcarina EBUS FNA was non diagnostic, hypocellular specimen.  # 08/13/2018 CT guided right upper lobe biopsy pathology showed dense fibrosis and mixed inflammatory cells with prominent polytypic plasma cells component. Focal benign bronchial wall and alveolar spaces. No malignancy was identified.   # 08/13/2018 underwent right thoracotomy and wedge resection of a right upper lobe mass.  Frozen section was consistent with an inflammatory process.  On the second postop day, preliminary pathology reports high-grade malignancy.  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  08/20/2018 Patient was therefore taken back to the OR and underwent take complete lobectomy  pT1b pN0 cM0 stage I squamous cell lung cancer.  Margin is negative.  Recommend observation.    # 12/03/2018 CT chest w contrast showed local recurrence.  12/10/2018 PET showed No evidence of distant metastatic disease # 12/26/2018 s/p bronchoscopy biopsy. Confirmed local recurrence of squamous cell lung cancer.   medi port placed by Dr.Oaks.  Cancer treatment Started concurrent chemoradiation on 01/16/2019 Carboplatin AUC of 2 and Taxol 45 mg/m2 weekly finished in 03/05/2019.   INTERVAL HISTORY Samuel Choi is a 54 y.o. male who has above history reviewed by me today presents for evaluation prior to immunotherapy for treatment of  recurrent  squamous cell lung cancer. Patient reports no new complaints. Patient is taking oral potassium and magnesium supplementation. Denies any diarrhea, skin rash, cough,  breathing difficulties.   Review of Systems  Constitutional: Negative for appetite change, chills, fatigue, fever and unexpected weight change.  HENT:   Negative for hearing loss and voice change.   Eyes: Negative for eye problems and icterus.  Respiratory: Negative for chest tightness, cough and shortness of breath.   Cardiovascular: Negative for chest pain and leg swelling.  Gastrointestinal: Negative for abdominal distention and abdominal pain.  Endocrine: Negative for hot flashes.  Genitourinary: Negative for difficulty urinating, dysuria and frequency.   Musculoskeletal: Negative for arthralgias.  Skin: Negative for itching and rash.  Neurological: Negative for light-headedness and numbness.  Hematological: Negative for adenopathy. Does not bruise/bleed easily.  Psychiatric/Behavioral: Negative for confusion.    MEDICAL HISTORY:  Past Medical History:  Diagnosis Date  . Alcohol abuse    usually drinks 2-3 drinks per day  . Atherosclerosis 06/2018  . Chronic sinusitis   . Dehydration 02/07/2019  . Emphysema of lung ([place]) 06/2018   patient unaware of this.  . Hip fracture ([place]) 05/2018   no surgery  . History of kidney stones 05/2018   per xray, bilateral nephrolitiasis  . Hypertension   . Squamous cell carcinoma of lung, right ([place]) 06/2018    SURGICAL HISTORY: Past Surgical History:  Procedure Laterality Date  . BRAIN SURGERY  10/2017   nasal/sinus endoscopy. mass benign  . ELECTROMAGNETIC NAVIGATION BROCHOSCOPY Right 06/25/2018   Procedure: ELECTROMAGNETIC NAVIGATION BRONCHOSCOPY;  Surgeon: [name], MD;  Location: [facility] ORS;  Service: Cardiopulmonary;  Laterality: Right;  . ENDOBRONCHIAL ULTRASOUND Right 06/25/2018   Procedure: ENDOBRONCHIAL ULTRASOUND;  Surgeon: [name], MD;  Location: [facility] ORS;  Service: Cardiopulmonary;  Laterality: Right;  . ENDOBRONCHIAL ULTRASOUND Right 12/26/2018   Procedure: ENDOBRONCHIAL ULTRASOUND RIGHT;  Surgeon: [name],  MD;  Location: [facility] ORS;  Service: Cardiopulmonary;  Laterality: Right;  . FLEXIBLE BRONCHOSCOPY N/A 08/20/2018   Procedure: FLEXIBLE BRONCHOSCOPY PREOP;  Surgeon: [name], MD;  Location: [facility] ORS;  Service: Thoracic;  Laterality: N/A;  . IR CV LINE INJECTION  04/04/2019  . NASAL SINUS SURGERY  10/2017   At [facility], frontal sinusotomy, ethmoidectomy, resection anterior cranial fossa neoplasm, turbinate resection  . PORTACATH PLACEMENT Left 01/15/2019   Procedure: INSERTION PORT-A-CATH;  Surgeon: [name], MD;  Location: [facility] ORS;  Service: General;  Laterality: Left;  . THORACOTOMY Right 08/13/2018   Procedure: PREOP BROCHOSCOPY WITH RIGHT THORACOTOMY AND RUL RESECTION;  Surgeon: [name], MD;  Location: [facility] OR[facility];  Service: General;  Laterality: Right;  . THORACOTOMY Right 08/20/2018   Procedure: THORACOTOMY MAJOR RIGHT UPPER LOBE LOBECTOMY;  Surgeon: [name], MD;  Location: [facility] OR[facility];  Service: Thoracic;  Laterality: Right;  . TOE SURGERY Left    pin in left toe    SOCIAL HISTORY: Social History   Socioeconomic History  . Marital status: Married    Spouse name: lisa  . Number of children: Not on file  . Years of education: Not on file  . Highest education level: Not on file  Occupational History  . Occupation: welding    Comment: taking time off to resolve issues  Tobacco Use  . Smoking status: Former Smoker    Packs/day: 0.50    Years: 15.00    Pack years: 7.50    Types: Cigarettes    Quit date: 08/2018    Years since quitting: 1.1  . Smokeless tobacco: Never Used  Substance and Sexual Activity  . Alcohol use: Yes    Alcohol/week: 3.0 standard drinks    Types: 3 Cans of beer per week    Comment: usually 2 drinks per week, per patient  . Drug use: No  . Sexual activity: Not on file  Other Topics Concern  . Not on file  Social History Narrative  . Not on file   Social Determinants of Health   Financial Resource Strain:   . Difficulty of Paying Living  Expenses:   Food Insecurity:   . Worried About Charity fundraiser in the Last Year:   . Arboriculturist in the Last Year:   Transportation Needs:   . Film/video editor (Medical):   Marland Kitchen Lack of Transportation (Non-Medical):   Physical Activity:   . Days of Exercise per Week:   . Minutes of Exercise per Session:   Stress:   . Feeling of Stress :   Social Connections:   . Frequency of Communication with Friends and Family:   . Frequency of Social Gatherings with Friends and Family:   . Attends Religious Services:   . Active Member of Clubs or Organizations:   . Attends Archivist Meetings:   Marland Kitchen Marital Status:   Intimate Partner Violence:   . Fear of Current or Ex-Partner:   . Emotionally Abused:   Marland Kitchen Physically Abused:   . Sexually Abused:     FAMILY HISTORY: Family History  Problem Relation Age of Onset  . Breast cancer Mother   . Diabetes Mother   . Lung cancer Father   . Hypertension Father     ALLERGIES:  has No Known Allergies.  MEDICATIONS:  Current Outpatient Medications  Medication Sig Dispense Refill  . albuterol (VENTOLIN HFA) 108 (90 Base) MCG/ACT inhaler Inhale 2 puffs into the lungs every 6 (  six) hours as needed for wheezing or shortness of breath. 8 g 6  . cloNIDine (CATAPRES) 0.1 MG tablet Take 0.1 mg by mouth daily.   5  . diltiazem (CARDIZEM CD) 120 MG 24 hr capsule Take 120 mg by mouth daily.    . folic acid (V-R FOLIC ACID) 628 MCG tablet Take 1 tablet (400 mcg total) by mouth daily. 90 tablet 1  . hydrALAZINE (APRESOLINE) 100 MG tablet Take 50 mg by mouth 2 (two) times daily.     . hydrochlorothiazide (HYDRODIURIL) 12.5 MG tablet Take 12.5 mg by mouth daily.     Marland Kitchen KLOR-CON M20 20 MEQ tablet TAKE 1 TABLET BY MOUTH TWICE A DAY 60 tablet 0  . latanoprost (XALATAN) 0.005 % ophthalmic solution 1 drop at bedtime.    . lidocaine-prilocaine (EMLA) cream Apply to affected area once 30 g 3  . magnesium chloride (SLOW-MAG) 64 MG TBEC SR tablet TAKE  1 TABLET (64 MG TOTAL) BY MOUTH DAILY. (Patient taking differently: Take 1 tablet by mouth 2 (two) times daily. TAKE 1 TABLET (64 MG TOTAL) BY MOUTH DAILY.) 90 tablet 0  . methocarbamol (ROBAXIN) 500 MG tablet Take 1 tablet (500 mg total) by mouth at bedtime. 30 tablet 0  . olmesartan (BENICAR) 40 MG tablet Take 40 mg by mouth every other day.     . ondansetron (ZOFRAN) 8 MG tablet Take 1 tablet (8 mg total) by mouth 2 (two) times daily as needed for refractory nausea / vomiting. Start on day 3 after chemo. 30 tablet 1  . oxyCODONE-acetaminophen (PERCOCET) 5-325 MG tablet Take 1-2 tablets by mouth every 4 (four) hours as needed for severe pain. 20 tablet 0  . prochlorperazine (COMPAZINE) 10 MG tablet Take 1 tablet (10 mg total) by mouth every 6 (six) hours as needed (Nausea or vomiting). 30 tablet 1  . vitamin B-12 (CYANOCOBALAMIN) 1000 MCG tablet Take 1 tablet (1,000 mcg total) by mouth daily. 90 tablet 1   No current facility-administered medications for this visit.   Facility-Administered Medications Ordered in Other Visits  Medication Dose Route Frequency Provider Last Rate Last Admin  . albuterol (PROVENTIL) (2.5 MG/3ML) 0.083% nebulizer solution 2.5 mg  2.5 mg Nebulization Once System, Provider Not In      . durvalumab (IMFINZI) 620 mg in sodium chloride 0.9 % 100 mL chemo infusion  10 mg/kg (Treatment Plan Recorded) Intravenous Once Earlie Server, MD 112 mL/hr at 10/13/19 1108 620 mg at 10/13/19 1108  . heparin lock flush 100 unit/mL  500 Units Intravenous Once Earlie Server, MD      . heparin lock flush 100 unit/mL  500 Units Intracatheter Once PRN Earlie Server, MD      . sodium chloride flush (NS) 0.9 % injection 10 mL  10 mL Intravenous PRN Earlie Server, MD   10 mL at 04/04/19 0903     PHYSICAL EXAMINATION: ECOG PERFORMANCE STATUS: 0 - Asymptomatic Vitals:   10/13/19 0903  BP: (!) 165/98  Pulse: (!) 59  Resp: 18  Temp: 98.3 F (36.8 C)  SpO2: 100%   Filed Weights   10/13/19 0903  Weight: 143  lb 9.6 oz (65.1 kg)    Physical Exam Constitutional:      General: He is not in acute distress. HENT:     Head: Normocephalic and atraumatic.  Eyes:     General: No scleral icterus. Cardiovascular:     Rate and Rhythm: Normal rate and regular rhythm.     Heart sounds: Normal  heart sounds.  Pulmonary:     Effort: Pulmonary effort is normal. No respiratory distress.     Breath sounds: No wheezing.  Abdominal:     General: Bowel sounds are normal. There is no distension.     Palpations: Abdomen is soft.  Musculoskeletal:        General: No deformity. Normal range of motion.     Cervical back: Normal range of motion and neck supple.  Skin:    General: Skin is warm and dry.     Findings: No erythema or rash.  Neurological:     Mental Status: He is alert and oriented to person, place, and time. Mental status is at baseline.     Cranial Nerves: No cranial nerve deficit.     Coordination: Coordination normal.  Psychiatric:        Mood and Affect: Mood normal.      LABORATORY DATA:  I have reviewed the data as listed Lab Results  Component Value Date   WBC 10.6 (H) 10/13/2019   HGB 14.3 10/13/2019   HCT 40.1 10/13/2019   MCV 102.3 (H) 10/13/2019   PLT 176 10/13/2019   Recent Labs    09/15/19 0837 09/29/19 0905 10/13/19 0837  NA 141 138 136  K 3.8 3.1* 3.9  CL 106 105 101  CO2 _0 GLUCOSE 111* 116* 147*  BUN _1 CREATININE 1.00 0.89 0.99  CALCIUM 8.8* 9.0 9.2  GFRNONAA >60 >60 >60  GFRAA >60 >60 >60  PROT 7.2 7.3 7.5  ALBUMIN 3.6 3.7 3.9  AST 43* 40 29  ALT _2 ALKPHOS 80 89 91  BILITOT 0.4 0.6 0.7   Iron/TIBC/Ferritin/ %Sat No results found for: IRON, TIBC, FERRITIN, IRONPCTSAT   Pathology 12/26/2018 Right hilar adenopathy/mass-EUS guided FNA Positive for malignancy, non small cell carcinoma, morphologically similar to previous right upper lobe squamous cell carcinoma.   ASSESSMENT & PLAN:  1. Squamous cell carcinoma of lung, right  ([place])   2. Hypomagnesemia   3. Encounter for antineoplastic immunotherapy   4. Macrocytosis without anemia   5. Uncontrolled hypertension    #Recurrent Squamous cell carcinoma of lung,  Currently on maintenance immunotherapy with durvalumab every 2 weeks. Patient tolerates immunotherapy well. Labs are reviewed and discussed with patient. Proceed with durvalumab today.  #Macrocytosis without anemia.  Likely secondary to chronic alcohol use. Vitamin B12 has been monitored levels are adequate. Recommend patient continue take oral vitamin B12 supplementation.  #Folate deficiency, continue folic acid supplementation. #uncontrolled hypertension, chronic issue for him.  Recommend patient to continue follow-up closely with primary care provider for management. # Hypomagnesium Magnesium at 1.6, will give him IV magnesium 2g x 1.  Advised patient to increase Slo magnesium to twice daily. # Hypokalemia, normal potassium level today.. Advise patient tocontinue  potassium chloride 20 mEq twice daily.    Return of visit: 2 weeks.    Earlie Server, MD, PhD Hematology Oncology [facility] technician smear is a possible [facility] 10/13/2019

## 2019-10-20 NOTE — Progress Notes (Signed)
Pharmacist Chemotherapy Monitoring - Follow Up Assessment    I verify that I have reviewed each item in the below checklist:  . Regimen for the patient is scheduled for the appropriate day and plan matches scheduled date. Marland Kitchen Appropriate non-routine labs are ordered dependent on drug ordered. . If applicable, additional medications reviewed and ordered per protocol based on lifetime cumulative doses and/or treatment regimen.   Plan for follow-up and/or issues identified: No . I-vent associated with next due treatment: No . MD and/or nursing notified: No  Shanae Luo K 10/20/2019 8:23 AM

## 2019-10-22 ENCOUNTER — Other Ambulatory Visit: Payer: Self-pay | Admitting: Oncology

## 2019-10-27 ENCOUNTER — Other Ambulatory Visit: Payer: Self-pay

## 2019-10-27 ENCOUNTER — Encounter: Payer: Self-pay | Admitting: Oncology

## 2019-10-27 ENCOUNTER — Inpatient Hospital Stay: Payer: Managed Care, Other (non HMO)

## 2019-10-27 ENCOUNTER — Inpatient Hospital Stay (HOSPITAL_BASED_OUTPATIENT_CLINIC_OR_DEPARTMENT_OTHER): Payer: Managed Care, Other (non HMO) | Admitting: Oncology

## 2019-10-27 VITALS — BP 146/81 | HR 97 | Temp 97.1°F | Resp 18 | Wt 145.7 lb

## 2019-10-27 DIAGNOSIS — I10 Essential (primary) hypertension: Secondary | ICD-10-CM

## 2019-10-27 DIAGNOSIS — E860 Dehydration: Secondary | ICD-10-CM

## 2019-10-27 DIAGNOSIS — C3491 Malignant neoplasm of unspecified part of right bronchus or lung: Secondary | ICD-10-CM | POA: Diagnosis not present

## 2019-10-27 DIAGNOSIS — D7589 Other specified diseases of blood and blood-forming organs: Secondary | ICD-10-CM

## 2019-10-27 DIAGNOSIS — Z95828 Presence of other vascular implants and grafts: Secondary | ICD-10-CM

## 2019-10-27 DIAGNOSIS — Z5112 Encounter for antineoplastic immunotherapy: Secondary | ICD-10-CM

## 2019-10-27 DIAGNOSIS — D696 Thrombocytopenia, unspecified: Secondary | ICD-10-CM

## 2019-10-27 LAB — CBC WITH DIFFERENTIAL/PLATELET
Abs Immature Granulocytes: 0.06 10*3/uL (ref 0.00–0.07)
Basophils Absolute: 0.1 10*3/uL (ref 0.0–0.1)
Basophils Relative: 1 %
Eosinophils Absolute: 0.1 10*3/uL (ref 0.0–0.5)
Eosinophils Relative: 1 %
HCT: 41.1 % (ref 39.0–52.0)
Hemoglobin: 14.7 g/dL (ref 13.0–17.0)
Immature Granulocytes: 1 %
Lymphocytes Relative: 9 %
Lymphs Abs: 0.9 10*3/uL (ref 0.7–4.0)
MCH: 36.6 pg — ABNORMAL HIGH (ref 26.0–34.0)
MCHC: 35.8 g/dL (ref 30.0–36.0)
MCV: 102.2 fL — ABNORMAL HIGH (ref 80.0–100.0)
Monocytes Absolute: 0.7 10*3/uL (ref 0.1–1.0)
Monocytes Relative: 7 %
Neutro Abs: 8.4 10*3/uL — ABNORMAL HIGH (ref 1.7–7.7)
Neutrophils Relative %: 81 %
Platelets: 155 10*3/uL (ref 150–400)
RBC: 4.02 MIL/uL — ABNORMAL LOW (ref 4.22–5.81)
RDW: 12.2 % (ref 11.5–15.5)
WBC: 10.2 10*3/uL (ref 4.0–10.5)
nRBC: 0 % (ref 0.0–0.2)

## 2019-10-27 LAB — COMPREHENSIVE METABOLIC PANEL
ALT: 21 U/L (ref 0–44)
AST: 30 U/L (ref 15–41)
Albumin: 3.7 g/dL (ref 3.5–5.0)
Alkaline Phosphatase: 85 U/L (ref 38–126)
Anion gap: 12 (ref 5–15)
BUN: 8 mg/dL (ref 6–20)
CO2: 23 mmol/L (ref 22–32)
Calcium: 9.1 mg/dL (ref 8.9–10.3)
Chloride: 103 mmol/L (ref 98–111)
Creatinine, Ser: 0.89 mg/dL (ref 0.61–1.24)
GFR calc Af Amer: >60 mL/min (ref >60)
GFR calc non Af Amer: >60 mL/min (ref >60)
Glucose, Bld: 120 mg/dL — ABNORMAL HIGH (ref 70–99)
Potassium: 3.2 mmol/L — ABNORMAL LOW (ref 3.5–5.1)
Sodium: 138 mmol/L (ref 135–145)
Total Bilirubin: 0.6 mg/dL (ref 0.3–1.2)
Total Protein: 7.4 g/dL (ref 6.5–8.1)

## 2019-10-27 LAB — MAGNESIUM: Magnesium: 1.6 mg/dL — ABNORMAL LOW (ref 1.7–2.4)

## 2019-10-27 LAB — FOLATE: Folate: 8.3 ng/mL (ref >5.9)

## 2019-10-27 MED ORDER — SODIUM CHLORIDE 0.9 % IV SOLN
10.0000 mg/kg | Freq: Once | INTRAVENOUS | Status: AC
  Administered 2019-10-27: 620 mg via INTRAVENOUS
  Filled 2019-10-27: qty 10

## 2019-10-27 MED ORDER — HEPARIN SOD (PORK) LOCK FLUSH 100 UNIT/ML IV SOLN
INTRAVENOUS | Status: AC
  Filled 2019-10-27: qty 5

## 2019-10-27 MED ORDER — HEPARIN SOD (PORK) LOCK FLUSH 100 UNIT/ML IV SOLN
500.0000 [IU] | Freq: Once | INTRAVENOUS | Status: AC | PRN
  Administered 2019-10-27: 500 [IU]
  Filled 2019-10-27: qty 5

## 2019-10-27 MED ORDER — SODIUM CHLORIDE 0.9% FLUSH
10.0000 mL | Freq: Once | INTRAVENOUS | Status: AC
  Administered 2019-10-27: 10 mL via INTRAVENOUS
  Filled 2019-10-27: qty 10

## 2019-10-27 MED ORDER — MAGNESIUM SULFATE 2 GM/50ML IV SOLN
2.0000 g | Freq: Once | INTRAVENOUS | Status: AC
  Administered 2019-10-27: 2 g via INTRAVENOUS
  Filled 2019-10-27: qty 50

## 2019-10-27 MED ORDER — SODIUM CHLORIDE 0.9 % IV SOLN
Freq: Once | INTRAVENOUS | Status: AC
  Filled 2019-10-27: qty 250

## 2019-10-27 NOTE — Progress Notes (Signed)
Patient does not offer any problems today.  

## 2019-10-27 NOTE — Progress Notes (Signed)
Hematology/Oncology Follow up note [facility] [phone] [phone]   Patient Care Team: Tracie Harrier, MD as PCP - General (Internal Medicine) Earlie Server, MD as Consulting Physician (Hematology and Oncology)  REASON FOR VISIT:  Follow up for treatment of squamous lung cancer.   HISTORY OF PRESENTING ILLNESS:  # Dec 2019 Stage I squamous lung cancer #s/p Bronchoscopy on 06/25/2018. subcarina EBUS FNA was non diagnostic, hypocellular specimen.  # 08/13/2018 CT guided right upper lobe biopsy pathology showed dense fibrosis and mixed inflammatory cells with prominent polytypic plasma cells component. Focal benign bronchial wall and alveolar spaces. No malignancy was identified.   # 08/13/2018 underwent right thoracotomy and wedge resection of a right upper lobe mass.  Frozen section was consistent with an inflammatory process.  On the second postop day, preliminary pathology reports high-grade malignancy.  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  08/20/2018 Patient was therefore taken back to the OR and underwent take complete lobectomy  pT1b pN0 cM0 stage I squamous cell lung cancer.  Margin is negative.  Recommend observation.    # 12/03/2018 CT chest w contrast showed local recurrence.  12/10/2018 PET showed No evidence of distant metastatic disease # 12/26/2018 s/p bronchoscopy biopsy. Confirmed local recurrence of squamous cell lung cancer.   medi port placed by Dr.Oaks.  Cancer treatment Started concurrent chemoradiation on 01/16/2019 Carboplatin AUC of 2 and Taxol 45 mg/m2 weekly finished in 03/05/2019.   INTERVAL HISTORY Samuel Choi is a 54 y.o. male who has above history reviewed by me today presents for evaluation prior to immunotherapy for treatment of  recurrent  squamous cell lung cancer. Pt has no new complaints. He take potassium and magnesium supplements. No diarrhea, skin rash, cough, breathing difficulties. Denies chest  pain, palpitation.    Review of Systems  Constitutional: Negative for appetite change, chills, fatigue, fever and unexpected weight change.  HENT:   Negative for hearing loss and voice change.   Eyes: Negative for eye problems and icterus.  Respiratory: Negative for chest tightness, cough and shortness of breath.   Cardiovascular: Negative for chest pain and leg swelling.  Gastrointestinal: Negative for abdominal distention and abdominal pain.  Endocrine: Negative for hot flashes.  Genitourinary: Negative for difficulty urinating, dysuria and frequency.   Musculoskeletal: Negative for arthralgias.  Skin: Negative for itching and rash.  Neurological: Negative for light-headedness and numbness.  Hematological: Negative for adenopathy. Does not bruise/bleed easily.  Psychiatric/Behavioral: Negative for confusion.    MEDICAL HISTORY:  Past Medical History:  Diagnosis Date  . Alcohol abuse    usually drinks 2-3 drinks per day  . Atherosclerosis 06/2018  . Chronic sinusitis   . Dehydration 02/07/2019  . Emphysema of lung ([place]) 06/2018   patient unaware of this.  . Hip fracture ([place]) 05/2018   no surgery  . History of kidney stones 05/2018   per xray, bilateral nephrolitiasis  . Hypertension   . Squamous cell carcinoma of lung, right ([place]) 06/2018    SURGICAL HISTORY: Past Surgical History:  Procedure Laterality Date  . BRAIN SURGERY  10/2017   nasal/sinus endoscopy. mass benign  . ELECTROMAGNETIC NAVIGATION BROCHOSCOPY Right 06/25/2018   Procedure: ELECTROMAGNETIC NAVIGATION BRONCHOSCOPY;  Surgeon: [name], MD;  Location: [facility] ORS;  Service: Cardiopulmonary;  Laterality: Right;  . ENDOBRONCHIAL ULTRASOUND Right 06/25/2018   Procedure: ENDOBRONCHIAL ULTRASOUND;  Surgeon: [name], MD;  Location: [facility] OR[facility];  Service: Cardiopulmonary;  Laterality: Right;  . ENDOBRONCHIAL ULTRASOUND Right 12/26/2018   Procedure: ENDOBRONCHIAL  ULTRASOUND RIGHT;  Surgeon: [name], MD;   Location: [facility] OR[facility];  Service: Cardiopulmonary;  Laterality: Right;  . FLEXIBLE BRONCHOSCOPY N/A 08/20/2018   Procedure: FLEXIBLE BRONCHOSCOPY PREOP;  Surgeon: [name], MD;  Location: [facility] ORS;  Service: Thoracic;  Laterality: N/A;  . IR CV LINE INJECTION  04/04/2019  . NASAL SINUS SURGERY  10/2017   At [facility], frontal sinusotomy, ethmoidectomy, resection anterior cranial fossa neoplasm, turbinate resection  . PORTACATH PLACEMENT Left 01/15/2019   Procedure: INSERTION PORT-A-CATH;  Surgeon: [name], MD;  Location: [facility] ORS;  Service: General;  Laterality: Left;  . THORACOTOMY Right 08/13/2018   Procedure: PREOP BROCHOSCOPY WITH RIGHT THORACOTOMY AND RUL RESECTION;  Surgeon: [name], MD;  Location: [facility] OR[facility];  Service: General;  Laterality: Right;  . THORACOTOMY Right 08/20/2018   Procedure: THORACOTOMY MAJOR RIGHT UPPER LOBE LOBECTOMY;  Surgeon: [name], MD;  Location: [facility] OR[facility];  Service: Thoracic;  Laterality: Right;  . TOE SURGERY Left    pin in left toe    SOCIAL HISTORY: Social History   Socioeconomic History  . Marital status: Married    Spouse name: lisa  . Number of children: Not on file  . Years of education: Not on file  . Highest education level: Not on file  Occupational History  . Occupation: welding    Comment: taking time off to resolve issues  Tobacco Use  . Smoking status: Former Smoker    Packs/day: 0.50    Years: 15.00    Pack years: 7.50    Types: Cigarettes    Quit date: 08/2018    Years since quitting: 1.2  . Smokeless tobacco: Never Used  Substance and Sexual Activity  . Alcohol use: Yes    Alcohol/week: 3.0 standard drinks    Types: 3 Cans of beer per week    Comment: usually 2 drinks per week, per patient  . Drug use: No  . Sexual activity: Not on file  Other Topics Concern  . Not on file  Social History Narrative  . Not on file   Social Determinants of Health   Financial Resource Strain:   . Difficulty of Paying Living Expenses:    Food Insecurity:   . Worried About Charity fundraiser in the Last Year:   . Arboriculturist in the Last Year:   Transportation Needs:   . Film/video editor (Medical):   Marland Kitchen Lack of Transportation (Non-Medical):   Physical Activity:   . Days of Exercise per Week:   . Minutes of Exercise per Session:   Stress:   . Feeling of Stress :   Social Connections:   . Frequency of Communication with Friends and Family:   . Frequency of Social Gatherings with Friends and Family:   . Attends Religious Services:   . Active Member of Clubs or Organizations:   . Attends Archivist Meetings:   Marland Kitchen Marital Status:   Intimate Partner Violence:   . Fear of Current or Ex-Partner:   . Emotionally Abused:   Marland Kitchen Physically Abused:   . Sexually Abused:     FAMILY HISTORY: Family History  Problem Relation Age of Onset  . Breast cancer Mother   . Diabetes Mother   . Lung cancer Father   . Hypertension Father     ALLERGIES:  has No Known Allergies.  MEDICATIONS:  Current Outpatient Medications  Medication Sig Dispense Refill  . albuterol (VENTOLIN HFA) 108 (90 Base) MCG/ACT inhaler Inhale 2 puffs into the  lungs every 6 (six) hours as needed for wheezing or shortness of breath. 8 g 6  . cloNIDine (CATAPRES) 0.1 MG tablet Take 0.1 mg by mouth daily.   5  . diltiazem (CARDIZEM CD) 120 MG 24 hr capsule Take 120 mg by mouth daily.    . folic acid (V-R FOLIC ACID) 622 MCG tablet Take 1 tablet (400 mcg total) by mouth daily. 90 tablet 1  . hydrALAZINE (APRESOLINE) 100 MG tablet Take 50 mg by mouth 2 (two) times daily.     . hydrochlorothiazide (HYDRODIURIL) 12.5 MG tablet Take 12.5 mg by mouth daily.     Marland Kitchen KLOR-CON M20 20 MEQ tablet TAKE 1 TABLET BY MOUTH TWICE A DAY 60 tablet 0  . latanoprost (XALATAN) 0.005 % ophthalmic solution 1 drop at bedtime.    . lidocaine-prilocaine (EMLA) cream Apply to affected area once 30 g 3  . magnesium chloride (SLOW-MAG) 64 MG TBEC SR tablet TAKE 1 TABLET  (64 MG TOTAL) BY MOUTH DAILY. (Patient taking differently: Take 1 tablet by mouth 2 (two) times daily. TAKE 1 TABLET (64 MG TOTAL) BY MOUTH DAILY.) 90 tablet 0  . methocarbamol (ROBAXIN) 500 MG tablet Take 1 tablet (500 mg total) by mouth at bedtime. 30 tablet 0  . olmesartan (BENICAR) 40 MG tablet Take 40 mg by mouth every other day.     . ondansetron (ZOFRAN) 8 MG tablet Take 1 tablet (8 mg total) by mouth 2 (two) times daily as needed for refractory nausea / vomiting. Start on day 3 after chemo. 30 tablet 1  . oxyCODONE-acetaminophen (PERCOCET) 5-325 MG tablet Take 1-2 tablets by mouth every 4 (four) hours as needed for severe pain. 20 tablet 0  . prochlorperazine (COMPAZINE) 10 MG tablet Take 1 tablet (10 mg total) by mouth every 6 (six) hours as needed (Nausea or vomiting). 30 tablet 1  . vitamin B-12 (CYANOCOBALAMIN) 1000 MCG tablet Take 1 tablet (1,000 mcg total) by mouth daily. 90 tablet 1   No current facility-administered medications for this visit.   Facility-Administered Medications Ordered in Other Visits  Medication Dose Route Frequency Provider Last Rate Last Admin  . albuterol (PROVENTIL) (2.5 MG/3ML) 0.083% nebulizer solution 2.5 mg  2.5 mg Nebulization Once System, Provider Not In      . heparin lock flush 100 unit/mL  500 Units Intravenous Once Earlie Server, MD      . sodium chloride flush (NS) 0.9 % injection 10 mL  10 mL Intravenous PRN Earlie Server, MD   10 mL at 04/04/19 0903  . sodium chloride flush (NS) 0.9 % injection 10 mL  10 mL Intravenous Once Earlie Server, MD         PHYSICAL EXAMINATION: ECOG PERFORMANCE STATUS: 0 - Asymptomatic Vitals:   10/27/19 0906  BP: (!) 146/81  Pulse: 97  Resp: 18  Temp: (!) 97.1 F (36.2 C)   Filed Weights   10/27/19 0906  Weight: 145 lb 11.2 oz (66.1 kg)    Physical Exam Constitutional:      General: He is not in acute distress. HENT:     Head: Normocephalic and atraumatic.  Eyes:     General: No scleral icterus. Cardiovascular:       Rate and Rhythm: Normal rate and regular rhythm.     Heart sounds: Normal heart sounds.  Pulmonary:     Effort: Pulmonary effort is normal. No respiratory distress.     Breath sounds: No wheezing.  Abdominal:     General: Bowel  sounds are normal. There is no distension.     Palpations: Abdomen is soft.  Musculoskeletal:        General: No deformity. Normal range of motion.     Cervical back: Normal range of motion and neck supple.  Skin:    General: Skin is warm and dry.     Findings: No erythema or rash.  Neurological:     General: No focal deficit present.     Mental Status: He is alert and oriented to person, place, and time. Mental status is at baseline.     Cranial Nerves: No cranial nerve deficit.     Coordination: Coordination normal.  Psychiatric:        Mood and Affect: Mood normal.      LABORATORY DATA:  I have reviewed the data as listed Lab Results  Component Value Date   WBC 10.6 (H) 10/13/2019   HGB 14.3 10/13/2019   HCT 40.1 10/13/2019   MCV 102.3 (H) 10/13/2019   PLT 176 10/13/2019   Recent Labs    09/15/19 0837 09/29/19 0905 10/13/19 0837  NA 141 138 136  K 3.8 3.1* 3.9  CL 106 105 101  CO2 _0 GLUCOSE 111* 116* 147*  BUN _1 CREATININE 1.00 0.89 0.99  CALCIUM 8.8* 9.0 9.2  GFRNONAA >60 >60 >60  GFRAA >60 >60 >60  PROT 7.2 7.3 7.5  ALBUMIN 3.6 3.7 3.9  AST 43* 40 29  ALT _2 ALKPHOS 80 89 91  BILITOT 0.4 0.6 0.7   Iron/TIBC/Ferritin/ %Sat No results found for: IRON, TIBC, FERRITIN, IRONPCTSAT   Pathology 12/26/2018 Right hilar adenopathy/mass-EUS guided FNA Positive for malignancy, non small cell carcinoma, morphologically similar to previous right upper lobe squamous cell carcinoma.   ASSESSMENT & PLAN:  1. Squamous cell carcinoma of lung, right ([place])   2. Encounter for antineoplastic immunotherapy   3. Macrocytosis without anemia   4. Uncontrolled hypertension    #Recurrent Squamous cell carcinoma of lung,   Currently on maintenance immunotherapy with durvalumab every 2 weeks.. Labs are reviewed and discussed with patient. Proceed with Durvalumab today.   # Hypomagnesia, proceed with IV Mag sulfate 2g x 1.  Continue Slow Mag twice daily.   # Hypokalemia, recommend patient to continue potassium 48mq twice daily.   #Macrocytosis without anemia.  Vitamin B12 has been monitored levels are adequate. continue oral vitamin B12 supplementation.  #Folate deficiency, continue folic acid supplementation. . Return of visit: 2 weeks.    ZEarlie Server MD, PhD Hematology Oncology CLud[facility] Laser And Surgery Center LLCtechnician smear is a possible [facility] 10/27/2019

## 2019-11-03 NOTE — Progress Notes (Signed)
Pharmacist Chemotherapy Monitoring - Follow Up Assessment    I verify that I have reviewed each item in the below checklist:  . Regimen for the patient is scheduled for the appropriate day and plan matches scheduled date. Marland Kitchen Appropriate non-routine labs are ordered dependent on drug ordered. . If applicable, additional medications reviewed and ordered per protocol based on lifetime cumulative doses and/or treatment regimen.   Plan for follow-up and/or issues identified: No . I-vent associated with next due treatment: No . MD and/or nursing notified: No  Samuel Choi K 11/03/2019 8:39 AM

## 2019-11-06 ENCOUNTER — Other Ambulatory Visit: Payer: Self-pay | Admitting: Oncology

## 2019-11-10 ENCOUNTER — Encounter: Payer: Self-pay | Admitting: Oncology

## 2019-11-10 ENCOUNTER — Other Ambulatory Visit: Payer: Self-pay

## 2019-11-10 ENCOUNTER — Inpatient Hospital Stay (HOSPITAL_BASED_OUTPATIENT_CLINIC_OR_DEPARTMENT_OTHER): Payer: Managed Care, Other (non HMO) | Admitting: Oncology

## 2019-11-10 ENCOUNTER — Inpatient Hospital Stay: Payer: Managed Care, Other (non HMO) | Attending: Oncology

## 2019-11-10 ENCOUNTER — Inpatient Hospital Stay: Payer: Managed Care, Other (non HMO)

## 2019-11-10 DIAGNOSIS — Z801 Family history of malignant neoplasm of trachea, bronchus and lung: Secondary | ICD-10-CM | POA: Insufficient documentation

## 2019-11-10 DIAGNOSIS — C3411 Malignant neoplasm of upper lobe, right bronchus or lung: Secondary | ICD-10-CM | POA: Insufficient documentation

## 2019-11-10 DIAGNOSIS — D696 Thrombocytopenia, unspecified: Secondary | ICD-10-CM

## 2019-11-10 DIAGNOSIS — Z95828 Presence of other vascular implants and grafts: Secondary | ICD-10-CM

## 2019-11-10 DIAGNOSIS — Z803 Family history of malignant neoplasm of breast: Secondary | ICD-10-CM | POA: Insufficient documentation

## 2019-11-10 DIAGNOSIS — I10 Essential (primary) hypertension: Secondary | ICD-10-CM | POA: Diagnosis not present

## 2019-11-10 DIAGNOSIS — Z5112 Encounter for antineoplastic immunotherapy: Secondary | ICD-10-CM

## 2019-11-10 DIAGNOSIS — J439 Emphysema, unspecified: Secondary | ICD-10-CM | POA: Diagnosis not present

## 2019-11-10 DIAGNOSIS — E876 Hypokalemia: Secondary | ICD-10-CM | POA: Diagnosis not present

## 2019-11-10 DIAGNOSIS — Z8249 Family history of ischemic heart disease and other diseases of the circulatory system: Secondary | ICD-10-CM | POA: Insufficient documentation

## 2019-11-10 DIAGNOSIS — D7589 Other specified diseases of blood and blood-forming organs: Secondary | ICD-10-CM | POA: Diagnosis not present

## 2019-11-10 DIAGNOSIS — Z79899 Other long term (current) drug therapy: Secondary | ICD-10-CM | POA: Insufficient documentation

## 2019-11-10 DIAGNOSIS — E538 Deficiency of other specified B group vitamins: Secondary | ICD-10-CM | POA: Insufficient documentation

## 2019-11-10 DIAGNOSIS — C3491 Malignant neoplasm of unspecified part of right bronchus or lung: Secondary | ICD-10-CM | POA: Diagnosis not present

## 2019-11-10 DIAGNOSIS — Z87891 Personal history of nicotine dependence: Secondary | ICD-10-CM | POA: Diagnosis not present

## 2019-11-10 DIAGNOSIS — Z833 Family history of diabetes mellitus: Secondary | ICD-10-CM | POA: Insufficient documentation

## 2019-11-10 LAB — COMPREHENSIVE METABOLIC PANEL
ALT: 18 U/L (ref 0–44)
AST: 20 U/L (ref 15–41)
Albumin: 3.6 g/dL (ref 3.5–5.0)
Alkaline Phosphatase: 81 U/L (ref 38–126)
Anion gap: 10 (ref 5–15)
BUN: 12 mg/dL (ref 6–20)
CO2: 25 mmol/L (ref 22–32)
Calcium: 9 mg/dL (ref 8.9–10.3)
Chloride: 101 mmol/L (ref 98–111)
Creatinine, Ser: 1 mg/dL (ref 0.61–1.24)
GFR calc Af Amer: >60 mL/min (ref >60)
GFR calc non Af Amer: >60 mL/min (ref >60)
Glucose, Bld: 118 mg/dL — ABNORMAL HIGH (ref 70–99)
Potassium: 3.7 mmol/L (ref 3.5–5.1)
Sodium: 136 mmol/L (ref 135–145)
Total Bilirubin: 0.7 mg/dL (ref 0.3–1.2)
Total Protein: 7.1 g/dL (ref 6.5–8.1)

## 2019-11-10 LAB — CBC WITH DIFFERENTIAL/PLATELET
Abs Immature Granulocytes: 0.02 10*3/uL (ref 0.00–0.07)
Basophils Absolute: 0.1 10*3/uL (ref 0.0–0.1)
Basophils Relative: 1 %
Eosinophils Absolute: 0.2 10*3/uL (ref 0.0–0.5)
Eosinophils Relative: 2 %
HCT: 41.7 % (ref 39.0–52.0)
Hemoglobin: 14.8 g/dL (ref 13.0–17.0)
Immature Granulocytes: 0 %
Lymphocytes Relative: 10 %
Lymphs Abs: 1 10*3/uL (ref 0.7–4.0)
MCH: 36.5 pg — ABNORMAL HIGH (ref 26.0–34.0)
MCHC: 35.5 g/dL (ref 30.0–36.0)
MCV: 102.7 fL — ABNORMAL HIGH (ref 80.0–100.0)
Monocytes Absolute: 0.9 10*3/uL (ref 0.1–1.0)
Monocytes Relative: 9 %
Neutro Abs: 7.9 10*3/uL — ABNORMAL HIGH (ref 1.7–7.7)
Neutrophils Relative %: 78 %
Platelets: 175 10*3/uL (ref 150–400)
RBC: 4.06 MIL/uL — ABNORMAL LOW (ref 4.22–5.81)
RDW: 11.9 % (ref 11.5–15.5)
WBC: 10 10*3/uL (ref 4.0–10.5)
nRBC: 0 % (ref 0.0–0.2)

## 2019-11-10 LAB — MAGNESIUM: Magnesium: 1.8 mg/dL (ref 1.7–2.4)

## 2019-11-10 MED ORDER — SODIUM CHLORIDE 0.9 % IV SOLN
Freq: Once | INTRAVENOUS | Status: AC
  Filled 2019-11-10: qty 250

## 2019-11-10 MED ORDER — HEPARIN SOD (PORK) LOCK FLUSH 100 UNIT/ML IV SOLN
500.0000 [IU] | Freq: Once | INTRAVENOUS | Status: AC
  Administered 2019-11-10: 11:00:00 500 [IU] via INTRAVENOUS
  Filled 2019-11-10: qty 5

## 2019-11-10 MED ORDER — SODIUM CHLORIDE 0.9% FLUSH
10.0000 mL | INTRAVENOUS | Status: DC | PRN
  Administered 2019-11-10: 08:00:00 10 mL via INTRAVENOUS
  Filled 2019-11-10: qty 10

## 2019-11-10 MED ORDER — SODIUM CHLORIDE 0.9 % IV SOLN
10.0000 mg/kg | Freq: Once | INTRAVENOUS | Status: AC
  Administered 2019-11-10: 620 mg via INTRAVENOUS
  Filled 2019-11-10: qty 10

## 2019-11-10 MED ORDER — HEPARIN SOD (PORK) LOCK FLUSH 100 UNIT/ML IV SOLN
INTRAVENOUS | Status: AC
  Filled 2019-11-10: qty 5

## 2019-11-10 MED ORDER — HEPARIN SOD (PORK) LOCK FLUSH 100 UNIT/ML IV SOLN
500.0000 [IU] | Freq: Once | INTRAVENOUS | Status: DC | PRN
  Filled 2019-11-10: qty 5

## 2019-11-10 NOTE — Progress Notes (Signed)
Patient here for follow up. No new concerns voiced.  °

## 2019-11-10 NOTE — Progress Notes (Signed)
Hematology/Oncology Follow up note [facility] Telephone:([phone]) [phone] Fax:(336) [phone]   Patient Care Team: Tracie Harrier, MD as PCP - General (Internal Medicine) Samuel Server, MD as Consulting Physician (Hematology and Oncology)  REASON FOR VISIT:  Follow up for treatment of squamous lung cancer.   HISTORY OF PRESENTING ILLNESS:  # Dec 2019 Stage I squamous lung cancer #s/p Bronchoscopy on 06/25/2018. subcarina EBUS FNA was non diagnostic, hypocellular specimen.  # 08/13/2018 CT guided right upper lobe biopsy pathology showed dense fibrosis and mixed inflammatory cells with prominent polytypic plasma cells component. Focal benign bronchial wall and alveolar spaces. No malignancy was identified.   # 08/13/2018 underwent right thoracotomy and wedge resection of a right upper lobe mass.  Frozen section was consistent with an inflammatory process.  On the second postop day, preliminary pathology reports high-grade malignancy.  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  08/20/2018 Patient was therefore taken back to the OR and underwent take complete lobectomy  pT1b pN0 cM0 stage I squamous cell lung cancer.  Margin is negative.  Recommend observation.    # 12/03/2018 CT chest w contrast showed local recurrence.  12/10/2018 PET showed No evidence of distant metastatic disease # 12/26/2018 s/p bronchoscopy biopsy. Confirmed local recurrence of squamous cell lung cancer.  medi port placed by Dr.Oaks.  Cancer treatment Started concurrent chemoradiation on 01/16/2019 Carboplatin AUC of 2 and Taxol 45 mg/m2 weekly finished in 03/05/2019.   INTERVAL HISTORY [name] is a 54 y.o. male who has above history reviewed by me today presents for evaluation prior to immunotherapy for treatment of  recurrent  squamous cell lung cancer. Pt has no new complaints. He take potassium and magnesium supplements. Denies any diarrhea, skin rash, cough, breathing difficulties.   Denies any chest pain or palpitation   Review of Systems  Constitutional: Negative for appetite change, chills, fatigue, fever and unexpected weight change.  HENT:   Negative for hearing loss and voice change.   Eyes: Negative for eye problems and icterus.  Respiratory: Negative for chest tightness, cough and shortness of breath.   Cardiovascular: Negative for chest pain and leg swelling.  Gastrointestinal: Negative for abdominal distention and abdominal pain.  Endocrine: Negative for hot flashes.  Genitourinary: Negative for difficulty urinating, dysuria and frequency.   Musculoskeletal: Negative for arthralgias.  Skin: Negative for itching and rash.  Neurological: Negative for light-headedness and numbness.  Hematological: Negative for adenopathy. Does not bruise/bleed easily.  Psychiatric/Behavioral: Negative for confusion.    MEDICAL HISTORY:  Past Medical History:  Diagnosis Date  . Alcohol abuse    usually drinks 2-3 drinks per day  . Atherosclerosis 06/2018  . Chronic sinusitis   . Dehydration 02/07/2019  . Emphysema of lung ([place]) 06/2018   patient unaware of this.  . Hip fracture ([place]) 05/2018   no surgery  . History of kidney stones 05/2018   per xray, bilateral nephrolitiasis  . Hypertension   . Squamous cell carcinoma of lung, right ([place]) 06/2018    SURGICAL HISTORY: Past Surgical History:  Procedure Laterality Date  . BRAIN SURGERY  10/2017   nasal/sinus endoscopy. mass benign  . ELECTROMAGNETIC NAVIGATION BROCHOSCOPY Right 06/25/2018   Procedure: ELECTROMAGNETIC NAVIGATION BRONCHOSCOPY;  Surgeon: [name], MD;  Location: [facility] ORS;  Service: Cardiopulmonary;  Laterality: Right;  . ENDOBRONCHIAL ULTRASOUND Right 06/25/2018   Procedure: ENDOBRONCHIAL ULTRASOUND;  Surgeon: [name], MD;  Location: [facility] OR[facility];  Service: Cardiopulmonary;  Laterality: Right;  . ENDOBRONCHIAL ULTRASOUND Right 12/26/2018  Procedure: ENDOBRONCHIAL ULTRASOUND RIGHT;  Surgeon:  [name], MD;  Location: [facility] OR[facility];  Service: Cardiopulmonary;  Laterality: Right;  . FLEXIBLE BRONCHOSCOPY N/A 08/20/2018   Procedure: FLEXIBLE BRONCHOSCOPY PREOP;  Surgeon: [name], MD;  Location: [facility] ORS;  Service: Thoracic;  Laterality: N/A;  . IR CV LINE INJECTION  04/04/2019  . NASAL SINUS SURGERY  10/2017   At [facility], frontal sinusotomy, ethmoidectomy, resection anterior cranial fossa neoplasm, turbinate resection  . PORTACATH PLACEMENT Left 01/15/2019   Procedure: INSERTION PORT-A-CATH;  Surgeon: [name], MD;  Location: [facility] ORS;  Service: General;  Laterality: Left;  . THORACOTOMY Right 08/13/2018   Procedure: PREOP BROCHOSCOPY WITH RIGHT THORACOTOMY AND RUL RESECTION;  Surgeon: [name], MD;  Location: [facility] OR[facility];  Service: General;  Laterality: Right;  . THORACOTOMY Right 08/20/2018   Procedure: THORACOTOMY MAJOR RIGHT UPPER LOBE LOBECTOMY;  Surgeon: [name], MD;  Location: [facility] OR[facility];  Service: Thoracic;  Laterality: Right;  . TOE SURGERY Left    pin in left toe    SOCIAL HISTORY: Social History   Socioeconomic History  . Marital status: Married    Spouse name: lisa  . Number of children: Not on file  . Years of education: Not on file  . Highest education level: Not on file  Occupational History  . Occupation: welding    Comment: taking time off to resolve issues  Tobacco Use  . Smoking status: Former Smoker    Packs/day: 0.50    Years: 15.00    Pack years: 7.50    Types: Cigarettes    Quit date: 08/2018    Years since quitting: 1.2  . Smokeless tobacco: Never Used  Substance and Sexual Activity  . Alcohol use: Yes    Alcohol/week: 3.0 standard drinks    Types: 3 Cans of beer per week    Comment: usually 2 drinks per week, per patient  . Drug use: No  . Sexual activity: Not on file  Other Topics Concern  . Not on file  Social History Narrative  . Not on file   Social Determinants of Health   Financial Resource Strain:   . Difficulty of  Paying Living Expenses:   Food Insecurity:   . Worried About Charity fundraiser in the Last Year:   . Arboriculturist in the Last Year:   Transportation Needs:   . Film/video editor (Medical):   Marland Kitchen Lack of Transportation (Non-Medical):   Physical Activity:   . Days of Exercise per Week:   . Minutes of Exercise per Session:   Stress:   . Feeling of Stress :   Social Connections:   . Frequency of Communication with Friends and Family:   . Frequency of Social Gatherings with Friends and Family:   . Attends Religious Services:   . Active Member of Clubs or Organizations:   . Attends Archivist Meetings:   Marland Kitchen Marital Status:   Intimate Partner Violence:   . Fear of Current or Ex-Partner:   . Emotionally Abused:   Marland Kitchen Physically Abused:   . Sexually Abused:     FAMILY HISTORY: Family History  Problem Relation Age of Onset  . Breast cancer Mother   . Diabetes Mother   . Lung cancer Father   . Hypertension Father     ALLERGIES:  has No Known Allergies.  MEDICATIONS:  Current Outpatient Medications  Medication Sig Dispense Refill  . albuterol (VENTOLIN HFA) 108 (90 Base) MCG/ACT inhaler Inhale 2 puffs  into the lungs every 6 (six) hours as needed for wheezing or shortness of breath. 8 g 6  . cloNIDine (CATAPRES) 0.1 MG tablet Take 0.1 mg by mouth daily.   5  . diltiazem (CARDIZEM CD) 120 MG 24 hr capsule Take 120 mg by mouth daily.    . folic acid (V-R FOLIC ACID) 967 MCG tablet Take 1 tablet (400 mcg total) by mouth daily. 90 tablet 1  . hydrALAZINE (APRESOLINE) 100 MG tablet Take 50 mg by mouth 2 (two) times daily.     . hydrochlorothiazide (HYDRODIURIL) 12.5 MG tablet Take 12.5 mg by mouth daily.     Marland Kitchen KLOR-CON M20 20 MEQ tablet TAKE 1 TABLET BY MOUTH TWICE A DAY 60 tablet 0  . latanoprost (XALATAN) 0.005 % ophthalmic solution 1 drop at bedtime.    . lidocaine-prilocaine (EMLA) cream Apply to affected area once 30 g 3  . magnesium chloride (SLOW-MAG) 64 MG TBEC  SR tablet TAKE 1 TABLET (64 MG TOTAL) BY MOUTH DAILY. (Patient taking differently: Take 1 tablet by mouth 2 (two) times daily. TAKE 1 TABLET (64 MG TOTAL) BY MOUTH DAILY.) 90 tablet 0  . methocarbamol (ROBAXIN) 500 MG tablet Take 1 tablet (500 mg total) by mouth at bedtime. 30 tablet 0  . olmesartan (BENICAR) 40 MG tablet Take 40 mg by mouth every other day.     . ondansetron (ZOFRAN) 8 MG tablet Take 1 tablet (8 mg total) by mouth 2 (two) times daily as needed for refractory nausea / vomiting. Start on day 3 after chemo. 30 tablet 1  . oxyCODONE-acetaminophen (PERCOCET) 5-325 MG tablet Take 1-2 tablets by mouth every 4 (four) hours as needed for severe pain. 20 tablet 0  . prochlorperazine (COMPAZINE) 10 MG tablet Take 1 tablet (10 mg total) by mouth every 6 (six) hours as needed (Nausea or vomiting). 30 tablet 1  . vitamin B-12 (CYANOCOBALAMIN) 1000 MCG tablet Take 1 tablet (1,000 mcg total) by mouth daily. 90 tablet 1   No current facility-administered medications for this visit.   Facility-Administered Medications Ordered in Other Visits  Medication Dose Route Frequency Provider Last Rate Last Admin  . albuterol (PROVENTIL) (2.5 MG/3ML) 0.083% nebulizer solution 2.5 mg  2.5 mg Nebulization Once System, Provider Not In      . heparin lock flush 100 unit/mL  500 Units Intravenous Once Samuel Server, MD      . heparin lock flush 100 unit/mL  500 Units Intravenous Once Samuel Server, MD      . sodium chloride flush (NS) 0.9 % injection 10 mL  10 mL Intravenous PRN Samuel Server, MD   10 mL at 04/04/19 0903  . sodium chloride flush (NS) 0.9 % injection 10 mL  10 mL Intravenous PRN Samuel Server, MD   10 mL at 11/10/19 0824     PHYSICAL EXAMINATION: ECOG PERFORMANCE STATUS: 0 - Asymptomatic Vitals:   11/10/19 0841  BP: (!) 142/97  Pulse: 92  Resp: 18  Temp: (!) 95.4 F (35.2 C)  SpO2: 100%   Filed Weights   11/10/19 0841  Weight: 145 lb 3.2 oz (65.9 kg)    Physical Exam Constitutional:      General:  He is not in acute distress. HENT:     Head: Normocephalic and atraumatic.  Eyes:     General: No scleral icterus. Cardiovascular:     Rate and Rhythm: Normal rate and regular rhythm.     Heart sounds: Normal heart sounds.  Pulmonary:  Effort: Pulmonary effort is normal. No respiratory distress.     Breath sounds: No wheezing.  Abdominal:     General: Bowel sounds are normal. There is no distension.     Palpations: Abdomen is soft.  Musculoskeletal:        General: No deformity. Normal range of motion.     Cervical back: Normal range of motion and neck supple.  Skin:    General: Skin is warm and dry.     Findings: No erythema or rash.  Neurological:     General: No focal deficit present.     Mental Status: He is alert and oriented to person, place, and time. Mental status is at baseline.     Cranial Nerves: No cranial nerve deficit.     Coordination: Coordination normal.  Psychiatric:        Mood and Affect: Mood normal.      LABORATORY DATA:  I have reviewed the data as listed Lab Results  Component Value Date   WBC 10.0 11/10/2019   HGB 14.8 11/10/2019   HCT 41.7 11/10/2019   MCV 102.7 (H) 11/10/2019   PLT 175 11/10/2019   Recent Labs    09/29/19 0905 10/13/19 0837 10/27/19 0851  NA 138 136 138  K 3.1* 3.9 3.2*  CL 105 101 103  CO2 _0 GLUCOSE 116* 147* 120*  BUN _1 CREATININE 0.89 0.99 0.89  CALCIUM 9.0 9.2 9.1  GFRNONAA >60 >60 >60  GFRAA >60 >60 >60  PROT 7.3 7.5 7.4  ALBUMIN 3.7 3.9 3.7  AST 40 29 30  ALT _2 ALKPHOS 89 91 85  BILITOT 0.6 0.7 0.6   Iron/TIBC/Ferritin/ %Sat No results found for: IRON, TIBC, FERRITIN, IRONPCTSAT   Pathology 12/26/2018 Right hilar adenopathy/mass-EUS guided FNA Positive for malignancy, non small cell carcinoma, morphologically similar to previous right upper lobe squamous cell carcinoma.   ASSESSMENT & PLAN:  1. Hypomagnesemia   2. Squamous cell carcinoma of lung, right ([place])   3.  Encounter for antineoplastic immunotherapy   4. Hypokalemia    #Recurrent Squamous cell carcinoma of lung,  Currently on maintenance immunotherapy with durvalumab every 2 weeks.. Labs are reviewed and discussed with patient Proceed with maintenance durvalumab today. He tolerates well. We will obtain CT chest without contrast prior to next visit.  # Hypomagnesia, continue Slow-Mag twice daily.  Magnesium level is 1.8 today. Continue Slow Mag twice daily.  # Hypokalemia, continue potassium 10mq twice daily.  Potassium 3.7 today.  #Macrocytosis without anemia.  Vitamin B12 has been monitored levels are adequate. Continue oral vitamin B12.  #Folate deficiency, continue folic acid supplementation. . Return of visit: 2 weeks.    ZEarlie Server MD, PhD Hematology Oncology CDurham Va Medical Centertechnician smear is a possible [facility] 11/10/2019

## 2019-11-17 NOTE — Progress Notes (Signed)
Pharmacist Chemotherapy Monitoring - Follow Up Assessment    I verify that I have reviewed each item in the below checklist:  . Regimen for the patient is scheduled for the appropriate day and plan matches scheduled date. Marland Kitchen Appropriate non-routine labs are ordered dependent on drug ordered. . If applicable, additional medications reviewed and ordered per protocol based on lifetime cumulative doses and/or treatment regimen.   Plan for follow-up and/or issues identified: No . I-vent associated with next due treatment: No . MD and/or nursing notified: No  Falon Huesca K 11/17/2019 9:07 AM

## 2019-11-21 ENCOUNTER — Other Ambulatory Visit: Payer: Self-pay

## 2019-11-21 ENCOUNTER — Ambulatory Visit
Admission: RE | Admit: 2019-11-21 | Discharge: 2019-11-21 | Disposition: A | Discharge status: Home / Self Care | Payer: Managed Care, Other (non HMO) | Source: Ambulatory Visit | Attending: Oncology | Admitting: Oncology

## 2019-11-21 DIAGNOSIS — C3491 Malignant neoplasm of unspecified part of right bronchus or lung: Secondary | ICD-10-CM | POA: Diagnosis not present

## 2019-11-24 ENCOUNTER — Other Ambulatory Visit: Payer: Self-pay

## 2019-11-24 ENCOUNTER — Inpatient Hospital Stay: Payer: Managed Care, Other (non HMO)

## 2019-11-24 ENCOUNTER — Encounter: Payer: Self-pay | Admitting: Oncology

## 2019-11-24 ENCOUNTER — Inpatient Hospital Stay (HOSPITAL_BASED_OUTPATIENT_CLINIC_OR_DEPARTMENT_OTHER): Payer: Managed Care, Other (non HMO) | Admitting: Oncology

## 2019-11-24 VITALS — BP 153/92 | HR 85 | Resp 18

## 2019-11-24 VITALS — BP 164/101 | HR 87 | Temp 97.0°F | Resp 16 | Wt 145.5 lb

## 2019-11-24 DIAGNOSIS — Z95828 Presence of other vascular implants and grafts: Secondary | ICD-10-CM

## 2019-11-24 DIAGNOSIS — C3491 Malignant neoplasm of unspecified part of right bronchus or lung: Secondary | ICD-10-CM

## 2019-11-24 DIAGNOSIS — Z5112 Encounter for antineoplastic immunotherapy: Secondary | ICD-10-CM | POA: Diagnosis not present

## 2019-11-24 DIAGNOSIS — E860 Dehydration: Secondary | ICD-10-CM

## 2019-11-24 DIAGNOSIS — D696 Thrombocytopenia, unspecified: Secondary | ICD-10-CM

## 2019-11-24 DIAGNOSIS — E876 Hypokalemia: Secondary | ICD-10-CM

## 2019-11-24 DIAGNOSIS — D7589 Other specified diseases of blood and blood-forming organs: Secondary | ICD-10-CM

## 2019-11-24 LAB — CBC WITH DIFFERENTIAL/PLATELET
Abs Immature Granulocytes: 0.03 10*3/uL (ref 0.00–0.07)
Basophils Absolute: 0.1 10*3/uL (ref 0.0–0.1)
Basophils Relative: 1 %
Eosinophils Absolute: 0.2 10*3/uL (ref 0.0–0.5)
Eosinophils Relative: 2 %
HCT: 41.3 % (ref 39.0–52.0)
Hemoglobin: 14.8 g/dL (ref 13.0–17.0)
Immature Granulocytes: 0 %
Lymphocytes Relative: 9 %
Lymphs Abs: 0.9 10*3/uL (ref 0.7–4.0)
MCH: 36.6 pg — ABNORMAL HIGH (ref 26.0–34.0)
MCHC: 35.8 g/dL (ref 30.0–36.0)
MCV: 102.2 fL — ABNORMAL HIGH (ref 80.0–100.0)
Monocytes Absolute: 0.8 10*3/uL (ref 0.1–1.0)
Monocytes Relative: 8 %
Neutro Abs: 7.4 10*3/uL (ref 1.7–7.7)
Neutrophils Relative %: 80 %
Platelets: 175 10*3/uL (ref 150–400)
RBC: 4.04 MIL/uL — ABNORMAL LOW (ref 4.22–5.81)
RDW: 11.9 % (ref 11.5–15.5)
WBC: 9.3 10*3/uL (ref 4.0–10.5)
nRBC: 0 % (ref 0.0–0.2)

## 2019-11-24 LAB — COMPREHENSIVE METABOLIC PANEL
ALT: 21 U/L (ref 0–44)
AST: 26 U/L (ref 15–41)
Albumin: 3.6 g/dL (ref 3.5–5.0)
Alkaline Phosphatase: 92 U/L (ref 38–126)
Anion gap: 9 (ref 5–15)
BUN: 9 mg/dL (ref 6–20)
CO2: 25 mmol/L (ref 22–32)
Calcium: 9.1 mg/dL (ref 8.9–10.3)
Chloride: 103 mmol/L (ref 98–111)
Creatinine, Ser: 0.94 mg/dL (ref 0.61–1.24)
GFR calc Af Amer: >60 mL/min (ref >60)
GFR calc non Af Amer: >60 mL/min (ref >60)
Glucose, Bld: 137 mg/dL — ABNORMAL HIGH (ref 70–99)
Potassium: 3.3 mmol/L — ABNORMAL LOW (ref 3.5–5.1)
Sodium: 137 mmol/L (ref 135–145)
Total Bilirubin: 0.9 mg/dL (ref 0.3–1.2)
Total Protein: 7.3 g/dL (ref 6.5–8.1)

## 2019-11-24 LAB — MAGNESIUM: Magnesium: 1.5 mg/dL — ABNORMAL LOW (ref 1.7–2.4)

## 2019-11-24 MED ORDER — HEPARIN SOD (PORK) LOCK FLUSH 100 UNIT/ML IV SOLN
INTRAVENOUS | Status: AC
  Filled 2019-11-24: qty 5

## 2019-11-24 MED ORDER — SODIUM CHLORIDE 0.9 % IV SOLN
10.0000 mg/kg | Freq: Once | INTRAVENOUS | Status: AC
  Administered 2019-11-24: 620 mg via INTRAVENOUS
  Filled 2019-11-24: qty 2.4

## 2019-11-24 MED ORDER — SODIUM CHLORIDE 0.9% FLUSH
10.0000 mL | INTRAVENOUS | Status: DC | PRN
  Administered 2019-11-24: 10 mL via INTRAVENOUS
  Filled 2019-11-24: qty 10

## 2019-11-24 MED ORDER — MAGNESIUM SULFATE 2 GM/50ML IV SOLN
2.0000 g | Freq: Once | INTRAVENOUS | Status: AC
  Administered 2019-11-24: 2 g via INTRAVENOUS
  Filled 2019-11-24: qty 50

## 2019-11-24 MED ORDER — SODIUM CHLORIDE 0.9 % IV SOLN
Freq: Once | INTRAVENOUS | Status: AC
  Filled 2019-11-24: qty 250

## 2019-11-24 MED ORDER — HEPARIN SOD (PORK) LOCK FLUSH 100 UNIT/ML IV SOLN
500.0000 [IU] | Freq: Once | INTRAVENOUS | Status: AC | PRN
  Administered 2019-11-24: 500 [IU]
  Filled 2019-11-24: qty 5

## 2019-11-24 NOTE — Progress Notes (Signed)
Hematology/Oncology Follow up note [name] [phone] [phone]   Patient Care Team: Tracie Harrier, MD as PCP - General (Internal Medicine) Earlie Server, MD as Consulting Physician (Hematology and Oncology)  REASON FOR VISIT:  Follow up for treatment of squamous lung cancer.   HISTORY OF PRESENTING ILLNESS:  # Dec 2019 Stage I squamous lung cancer #s/p Bronchoscopy on 06/25/2018. subcarina EBUS FNA was non diagnostic, hypocellular specimen.  # 08/13/2018 CT guided right upper lobe biopsy pathology showed dense fibrosis and mixed inflammatory cells with prominent polytypic plasma cells component. Focal benign bronchial wall and alveolar spaces. No malignancy was identified.   # 08/13/2018 underwent right thoracotomy and wedge resection of a right upper lobe mass.  Frozen section was consistent with an inflammatory process.  On the second postop day, preliminary pathology reports high-grade malignancy.  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  08/20/2018 Patient was therefore taken back to the OR and underwent take complete lobectomy  pT1b pN0 cM0 stage I squamous cell lung cancer.  Margin is negative.  Recommend observation.    # 12/03/2018 CT chest w contrast showed local recurrence.  12/10/2018 PET showed No evidence of distant metastatic disease # 12/26/2018 s/p bronchoscopy biopsy. Confirmed local recurrence of squamous cell lung cancer.  medi port placed by Dr.Oaks.  Cancer treatment Started concurrent chemoradiation on 01/16/2019 Carboplatin AUC of 2 and Taxol 45 mg/m2 weekly finished in 03/05/2019.   INTERVAL HISTORY Samuel Choi is a 54 y.o. male who has above history reviewed by me today presents for evaluation prior to immunotherapy for treatment of  recurrent  squamous cell lung cancer. Today patient has no new complaints.  He takes potassium and magnesium supplementation. Denies any diarrhea, skin rash, cough, breathing  difficulties.  Denies any chest pain or palpitation He had surveillance CT scan done during interval as well.  Review of Systems  Constitutional: Negative for appetite change, chills, fatigue, fever and unexpected weight change.  HENT:   Negative for hearing loss and voice change.   Eyes: Negative for eye problems and icterus.  Respiratory: Negative for chest tightness, cough and shortness of breath.   Cardiovascular: Negative for chest pain and leg swelling.  Gastrointestinal: Negative for abdominal distention and abdominal pain.  Endocrine: Negative for hot flashes.  Genitourinary: Negative for difficulty urinating, dysuria and frequency.   Musculoskeletal: Negative for arthralgias.  Skin: Negative for itching and rash.  Neurological: Negative for light-headedness and numbness.  Hematological: Negative for adenopathy. Does not bruise/bleed easily.  Psychiatric/Behavioral: Negative for confusion.    MEDICAL HISTORY:  Past Medical History:  Diagnosis Date  . Alcohol abuse    usually drinks 2-3 drinks per day  . Atherosclerosis 06/2018  . Chronic sinusitis   . Dehydration 02/07/2019  . Emphysema of lung ([place]) 06/2018   patient unaware of this.  . Hip fracture ([place]) 05/2018   no surgery  . History of kidney stones 05/2018   per xray, bilateral nephrolitiasis  . Hypertension   . Squamous cell carcinoma of lung, right ([place]) 06/2018    SURGICAL HISTORY: Past Surgical History:  Procedure Laterality Date  . BRAIN SURGERY  10/2017   nasal/sinus endoscopy. mass benign  . ELECTROMAGNETIC NAVIGATION BROCHOSCOPY Right 06/25/2018   Procedure: ELECTROMAGNETIC NAVIGATION BRONCHOSCOPY;  Surgeon: [name], MD;  Location: [facility] ORS;  Service: Cardiopulmonary;  Laterality: Right;  . ENDOBRONCHIAL ULTRASOUND Right 06/25/2018   Procedure: ENDOBRONCHIAL ULTRASOUND;  Surgeon: [name], MD;  Location: [facility];  Service: Cardiopulmonary;  Laterality: Right;  . ENDOBRONCHIAL ULTRASOUND  Right 12/26/2018   Procedure: ENDOBRONCHIAL ULTRASOUND RIGHT;  Surgeon: [name], MD;  Location: [facility] OR[facility];  Service: Cardiopulmonary;  Laterality: Right;  . FLEXIBLE BRONCHOSCOPY N/A 08/20/2018   Procedure: FLEXIBLE BRONCHOSCOPY PREOP;  Surgeon: [name], MD;  Location: [facility] OR[facility];  Service: Thoracic;  Laterality: N/A;  . IR CV LINE INJECTION  04/04/2019  . NASAL SINUS SURGERY  10/2017   At [facility], frontal sinusotomy, ethmoidectomy, resection anterior cranial fossa neoplasm, turbinate resection  . PORTACATH PLACEMENT Left 01/15/2019   Procedure: INSERTION PORT-A-CATH;  Surgeon: [name], MD;  Location: [facility] OR[facility];  Service: General;  Laterality: Left;  . THORACOTOMY Right 08/13/2018   Procedure: PREOP BROCHOSCOPY WITH RIGHT THORACOTOMY AND RUL RESECTION;  Surgeon: [name], MD;  Location: [facility] OR[facility];  Service: General;  Laterality: Right;  . THORACOTOMY Right 08/20/2018   Procedure: THORACOTOMY MAJOR RIGHT UPPER LOBE LOBECTOMY;  Surgeon: [name], MD;  Location: [facility] OR[facility];  Service: Thoracic;  Laterality: Right;  . TOE SURGERY Left    pin in left toe    SOCIAL HISTORY: Social History   Socioeconomic History  . Marital status: Married    Spouse name: lisa  . Number of children: Not on file  . Years of education: Not on file  . Highest education level: Not on file  Occupational History  . Occupation: welding    Comment: taking time off to resolve issues  Tobacco Use  . Smoking status: Former Smoker    Packs/day: 0.50    Years: 15.00    Pack years: 7.50    Types: Cigarettes    Quit date: 08/2018    Years since quitting: 1.2  . Smokeless tobacco: Never Used  Substance and Sexual Activity  . Alcohol use: Yes    Alcohol/week: 3.0 standard drinks    Types: 3 Cans of beer per week    Comment: usually 2 drinks per week, per patient  . Drug use: No  . Sexual activity: Not on file  Other Topics Concern  . Not on file  Social History Narrative  . Not on file   Social  Determinants of Health   Financial Resource Strain:   . Difficulty of Paying Living Expenses:   Food Insecurity:   . Worried About Charity fundraiser in the Last Year:   . Arboriculturist in the Last Year:   Transportation Needs:   . Film/video editor (Medical):   Marland Kitchen Lack of Transportation (Non-Medical):   Physical Activity:   . Days of Exercise per Week:   . Minutes of Exercise per Session:   Stress:   . Feeling of Stress :   Social Connections:   . Frequency of Communication with Friends and Family:   . Frequency of Social Gatherings with Friends and Family:   . Attends Religious Services:   . Active Member of Clubs or Organizations:   . Attends Archivist Meetings:   Marland Kitchen Marital Status:   Intimate Partner Violence:   . Fear of Current or Ex-Partner:   . Emotionally Abused:   Marland Kitchen Physically Abused:   . Sexually Abused:     FAMILY HISTORY: Family History  Problem Relation Age of Onset  . Breast cancer Mother   . Diabetes Mother   . Lung cancer Father   . Hypertension Father     ALLERGIES:  has No Known Allergies.  MEDICATIONS:  Current Outpatient Medications  Medication Sig Dispense Refill  . albuterol (  VENTOLIN HFA) 108 (90 Base) MCG/ACT inhaler Inhale 2 puffs into the lungs every 6 (six) hours as needed for wheezing or shortness of breath. 8 g 6  . cloNIDine (CATAPRES) 0.1 MG tablet Take 0.1 mg by mouth daily.   5  . diltiazem (CARDIZEM CD) 120 MG 24 hr capsule Take 120 mg by mouth daily.    . folic acid (V-R FOLIC ACID) 937 MCG tablet Take 1 tablet (400 mcg total) by mouth daily. 90 tablet 1  . hydrALAZINE (APRESOLINE) 100 MG tablet Take 50 mg by mouth 2 (two) times daily.     . hydrochlorothiazide (HYDRODIURIL) 12.5 MG tablet Take 12.5 mg by mouth daily.     Marland Kitchen KLOR-CON M20 20 MEQ tablet TAKE 1 TABLET BY MOUTH TWICE A DAY 60 tablet 0  . latanoprost (XALATAN) 0.005 % ophthalmic solution 1 drop at bedtime.    . lidocaine-prilocaine (EMLA) cream Apply to  affected area once 30 g 3  . magnesium chloride (SLOW-MAG) 64 MG TBEC SR tablet TAKE 1 TABLET (64 MG TOTAL) BY MOUTH DAILY. (Patient taking differently: Take 1 tablet by mouth 2 (two) times daily. TAKE 1 TABLET (64 MG TOTAL) BY MOUTH DAILY.) 90 tablet 0  . methocarbamol (ROBAXIN) 500 MG tablet Take 1 tablet (500 mg total) by mouth at bedtime. 30 tablet 0  . olmesartan (BENICAR) 40 MG tablet Take 40 mg by mouth every other day.     . ondansetron (ZOFRAN) 8 MG tablet Take 1 tablet (8 mg total) by mouth 2 (two) times daily as needed for refractory nausea / vomiting. Start on day 3 after chemo. 30 tablet 1  . oxyCODONE-acetaminophen (PERCOCET) 5-325 MG tablet Take 1-2 tablets by mouth every 4 (four) hours as needed for severe pain. 20 tablet 0  . prochlorperazine (COMPAZINE) 10 MG tablet Take 1 tablet (10 mg total) by mouth every 6 (six) hours as needed (Nausea or vomiting). 30 tablet 1  . vitamin B-12 (CYANOCOBALAMIN) 1000 MCG tablet Take 1 tablet (1,000 mcg total) by mouth daily. 90 tablet 1   No current facility-administered medications for this visit.   Facility-Administered Medications Ordered in Other Visits  Medication Dose Route Frequency Provider Last Rate Last Admin  . albuterol (PROVENTIL) (2.5 MG/3ML) 0.083% nebulizer solution 2.5 mg  2.5 mg Nebulization Once System, Provider Not In      . heparin lock flush 100 unit/mL  500 Units Intravenous Once Earlie Server, MD      . sodium chloride flush (NS) 0.9 % injection 10 mL  10 mL Intravenous PRN Earlie Server, MD   10 mL at 04/04/19 0903  . sodium chloride flush (NS) 0.9 % injection 10 mL  10 mL Intravenous PRN Earlie Server, MD         PHYSICAL EXAMINATION: ECOG PERFORMANCE STATUS: 0 - Asymptomatic Vitals:   11/24/19 0838  BP: (!) 164/101  Pulse: 87  Resp: 16  Temp: (!) 97 F (36.1 C)   Filed Weights   11/24/19 0838  Weight: 145 lb 8 oz (66 kg)    Physical Exam Constitutional:      General: He is not in acute distress. HENT:     Head:  Normocephalic and atraumatic.  Eyes:     General: No scleral icterus. Cardiovascular:     Rate and Rhythm: Normal rate and regular rhythm.     Heart sounds: Normal heart sounds.  Pulmonary:     Effort: Pulmonary effort is normal. No respiratory distress.     Breath  sounds: No wheezing.  Abdominal:     General: Bowel sounds are normal. There is no distension.     Palpations: Abdomen is soft.  Musculoskeletal:        General: No deformity. Normal range of motion.     Cervical back: Normal range of motion and neck supple.  Skin:    General: Skin is warm and dry.     Findings: No erythema or rash.  Neurological:     General: No focal deficit present.     Mental Status: He is alert and oriented to person, place, and time.     Cranial Nerves: No cranial nerve deficit.     Coordination: Coordination normal.  Psychiatric:        Mood and Affect: Mood normal.      LABORATORY DATA:  I have reviewed the data as listed Lab Results  Component Value Date   WBC 10.0 11/10/2019   HGB 14.8 11/10/2019   HCT 41.7 11/10/2019   MCV 102.7 (H) 11/10/2019   PLT 175 11/10/2019   Recent Labs    10/13/19 0837 10/27/19 0851 11/10/19 0824  NA 136 138 136  K 3.9 3.2* 3.7  CL 101 103 101  CO2 _0 GLUCOSE 147* 120* 118*  BUN _1 CREATININE 0.99 0.89 1.00  CALCIUM 9.2 9.1 9.0  GFRNONAA >60 >60 >60  GFRAA >60 >60 >60  PROT 7.5 7.4 7.1  ALBUMIN 3.9 3.7 3.6  AST _2 ALT _3 ALKPHOS 91 85 81  BILITOT 0.7 0.6 0.7   Iron/TIBC/Ferritin/ %Sat No results found for: IRON, TIBC, FERRITIN, IRONPCTSAT   Pathology 12/26/2018 Right hilar adenopathy/mass-EUS guided FNA Positive for malignancy, non small cell carcinoma, morphologically similar to previous right upper lobe squamous cell carcinoma.   ASSESSMENT & PLAN:  1. Squamous cell carcinoma of lung, right ([place])   2. Encounter for antineoplastic immunotherapy   3. Macrocytosis without anemia   4. Hypokalemia   5.  Hypomagnesemia    #Recurrent Squamous cell carcinoma of lung,  Currently on maintenance immunotherapy with durvalumab every 2 weeks.. CT surveillance was independently reviewed by me discussed with patient. No specific evidence of recurrence or metastatic disease seen. Small new lung nodules warrant short-term follow-up.. We will obtain CT in 3 months. Labs are reviewed and discussed with patient.  Proceed with durvalumab treatment today.  # Hypomagnesia, continue Slow-Mag twice daily.  Magnesium level is 1.5 today.  Patient will receive 2 g of IV magnesium sulfate today.  # Hypokalemia, Potassium 3.3 today.  Continue potassium 20 mEq twice daily.  #Macrocytosis without anemia.  Continue oral vitamin B12 supplementation.  #Folate deficiency, continue folic acid supplementation. . Return of visit: 2 weeks.    Earlie Server, MD, PhD Hematology Oncology [facility] 11/24/2019

## 2019-11-24 NOTE — Progress Notes (Signed)
Patient denies problems/concerns today.  His bp is elevated and he states he was had f/u with PCP on Friday with a good bp reading.

## 2019-11-24 NOTE — Progress Notes (Signed)
Blood Pressure: 153/92. MD, Dr. Tasia Catchings, aware. Per MD order: proceed with scheduled Durvalumab treatment today. Magnesium: 1.5 today. Per MD order: add on Magnesium Sulfate 2g IVPB once over 1 hour infusion today; order present in supportive therapy plan. MD aware of Potassium: 3.3 today; no new orders.

## 2019-12-03 NOTE — Progress Notes (Signed)
Pharmacist Chemotherapy Monitoring - Follow Up Assessment    I verify that I have reviewed each item in the below checklist:  . Regimen for the patient is scheduled for the appropriate day and plan matches scheduled date. Marland Kitchen Appropriate non-routine labs are ordered dependent on drug ordered. . If applicable, additional medications reviewed and ordered per protocol based on lifetime cumulative doses and/or treatment regimen.   Plan for follow-up and/or issues identified: No . I-vent associated with next due treatment: No . MD and/or nursing notified: No  Paislie Tessler K 12/03/2019 8:59 AM

## 2019-12-10 ENCOUNTER — Other Ambulatory Visit: Payer: Self-pay

## 2019-12-10 ENCOUNTER — Inpatient Hospital Stay: Payer: Managed Care, Other (non HMO)

## 2019-12-10 ENCOUNTER — Inpatient Hospital Stay (HOSPITAL_BASED_OUTPATIENT_CLINIC_OR_DEPARTMENT_OTHER): Payer: Managed Care, Other (non HMO) | Admitting: Oncology

## 2019-12-10 ENCOUNTER — Inpatient Hospital Stay: Payer: Managed Care, Other (non HMO) | Attending: Oncology

## 2019-12-10 ENCOUNTER — Encounter: Payer: Self-pay | Admitting: Oncology

## 2019-12-10 VITALS — BP 137/82 | HR 80 | Resp 18

## 2019-12-10 VITALS — BP 149/94 | HR 88 | Temp 96.2°F | Resp 18 | Wt 145.8 lb

## 2019-12-10 DIAGNOSIS — Z5112 Encounter for antineoplastic immunotherapy: Secondary | ICD-10-CM

## 2019-12-10 DIAGNOSIS — C3411 Malignant neoplasm of upper lobe, right bronchus or lung: Secondary | ICD-10-CM | POA: Insufficient documentation

## 2019-12-10 DIAGNOSIS — Z833 Family history of diabetes mellitus: Secondary | ICD-10-CM | POA: Diagnosis not present

## 2019-12-10 DIAGNOSIS — E876 Hypokalemia: Secondary | ICD-10-CM | POA: Diagnosis not present

## 2019-12-10 DIAGNOSIS — Z79899 Other long term (current) drug therapy: Secondary | ICD-10-CM | POA: Diagnosis not present

## 2019-12-10 DIAGNOSIS — Z8249 Family history of ischemic heart disease and other diseases of the circulatory system: Secondary | ICD-10-CM | POA: Insufficient documentation

## 2019-12-10 DIAGNOSIS — C3491 Malignant neoplasm of unspecified part of right bronchus or lung: Secondary | ICD-10-CM

## 2019-12-10 DIAGNOSIS — Z87891 Personal history of nicotine dependence: Secondary | ICD-10-CM | POA: Insufficient documentation

## 2019-12-10 DIAGNOSIS — Z801 Family history of malignant neoplasm of trachea, bronchus and lung: Secondary | ICD-10-CM | POA: Insufficient documentation

## 2019-12-10 DIAGNOSIS — J439 Emphysema, unspecified: Secondary | ICD-10-CM | POA: Diagnosis not present

## 2019-12-10 DIAGNOSIS — Z803 Family history of malignant neoplasm of breast: Secondary | ICD-10-CM | POA: Insufficient documentation

## 2019-12-10 DIAGNOSIS — Z95828 Presence of other vascular implants and grafts: Secondary | ICD-10-CM

## 2019-12-10 DIAGNOSIS — D696 Thrombocytopenia, unspecified: Secondary | ICD-10-CM

## 2019-12-10 DIAGNOSIS — E860 Dehydration: Secondary | ICD-10-CM

## 2019-12-10 DIAGNOSIS — I10 Essential (primary) hypertension: Secondary | ICD-10-CM | POA: Diagnosis not present

## 2019-12-10 LAB — CBC WITH DIFFERENTIAL/PLATELET
Abs Immature Granulocytes: 0.02 10*3/uL (ref 0.00–0.07)
Basophils Absolute: 0.1 10*3/uL (ref 0.0–0.1)
Basophils Relative: 1 %
Eosinophils Absolute: 0.1 10*3/uL (ref 0.0–0.5)
Eosinophils Relative: 2 %
HCT: 42.6 % (ref 39.0–52.0)
Hemoglobin: 15.1 g/dL (ref 13.0–17.0)
Immature Granulocytes: 0 %
Lymphocytes Relative: 11 %
Lymphs Abs: 0.9 10*3/uL (ref 0.7–4.0)
MCH: 36.7 pg — ABNORMAL HIGH (ref 26.0–34.0)
MCHC: 35.4 g/dL (ref 30.0–36.0)
MCV: 103.6 fL — ABNORMAL HIGH (ref 80.0–100.0)
Monocytes Absolute: 0.6 10*3/uL (ref 0.1–1.0)
Monocytes Relative: 8 %
Neutro Abs: 6.1 10*3/uL (ref 1.7–7.7)
Neutrophils Relative %: 78 %
Platelets: 153 10*3/uL (ref 150–400)
RBC: 4.11 MIL/uL — ABNORMAL LOW (ref 4.22–5.81)
RDW: 12.9 % (ref 11.5–15.5)
WBC: 7.8 10*3/uL (ref 4.0–10.5)
nRBC: 0 % (ref 0.0–0.2)

## 2019-12-10 LAB — COMPREHENSIVE METABOLIC PANEL
ALT: 21 U/L (ref 0–44)
AST: 27 U/L (ref 15–41)
Albumin: 3.7 g/dL (ref 3.5–5.0)
Alkaline Phosphatase: 89 U/L (ref 38–126)
Anion gap: 14 (ref 5–15)
BUN: 8 mg/dL (ref 6–20)
CO2: 24 mmol/L (ref 22–32)
Calcium: 9.1 mg/dL (ref 8.9–10.3)
Chloride: 104 mmol/L (ref 98–111)
Creatinine, Ser: 0.78 mg/dL (ref 0.61–1.24)
GFR calc Af Amer: >60 mL/min (ref >60)
GFR calc non Af Amer: >60 mL/min (ref >60)
Glucose, Bld: 105 mg/dL — ABNORMAL HIGH (ref 70–99)
Potassium: 3.4 mmol/L — ABNORMAL LOW (ref 3.5–5.1)
Sodium: 142 mmol/L (ref 135–145)
Total Bilirubin: 0.6 mg/dL (ref 0.3–1.2)
Total Protein: 7.5 g/dL (ref 6.5–8.1)

## 2019-12-10 LAB — MAGNESIUM: Magnesium: 1.5 mg/dL — ABNORMAL LOW (ref 1.7–2.4)

## 2019-12-10 MED ORDER — SODIUM CHLORIDE 0.9 % IV SOLN
Freq: Once | INTRAVENOUS | Status: AC
  Filled 2019-12-10: qty 250

## 2019-12-10 MED ORDER — MAGNESIUM SULFATE 2 GM/50ML IV SOLN
2.0000 g | Freq: Once | INTRAVENOUS | Status: AC
  Administered 2019-12-10: 2 g via INTRAVENOUS
  Filled 2019-12-10: qty 50

## 2019-12-10 MED ORDER — SODIUM CHLORIDE 0.9% FLUSH
10.0000 mL | INTRAVENOUS | Status: DC | PRN
  Administered 2019-12-10: 10 mL
  Filled 2019-12-10: qty 10

## 2019-12-10 MED ORDER — HEPARIN SOD (PORK) LOCK FLUSH 100 UNIT/ML IV SOLN
500.0000 [IU] | Freq: Once | INTRAVENOUS | Status: AC | PRN
  Administered 2019-12-10: 500 [IU]
  Filled 2019-12-10: qty 5

## 2019-12-10 MED ORDER — HEPARIN SOD (PORK) LOCK FLUSH 100 UNIT/ML IV SOLN
INTRAVENOUS | Status: AC
  Filled 2019-12-10: qty 5

## 2019-12-10 MED ORDER — SODIUM CHLORIDE 0.9% FLUSH
10.0000 mL | INTRAVENOUS | Status: DC | PRN
  Administered 2019-12-10: 10 mL via INTRAVENOUS
  Filled 2019-12-10: qty 10

## 2019-12-10 MED ORDER — SODIUM CHLORIDE 0.9 % IV SOLN
10.0000 mg/kg | Freq: Once | INTRAVENOUS | Status: AC
  Administered 2019-12-10: 620 mg via INTRAVENOUS
  Filled 2019-12-10: qty 2.4

## 2019-12-10 NOTE — Progress Notes (Signed)
Magnesium: 1.5 today. Per MD, Dr. Tasia Catchings, order: add on Magnesium Sulfate 2g IVPB once over 1 hour infusion today; order present in supportive therapy plan. MD aware of Potassium: 3.4 today; no new orders.

## 2019-12-10 NOTE — Progress Notes (Signed)
Pt here for follow up and tx. No new concerns voiced.

## 2019-12-10 NOTE — Progress Notes (Signed)
Hematology/Oncology Follow up note [facility] [phone] [phone]   Patient Care Team: Tracie Harrier, MD as PCP - General (Internal Medicine) Earlie Server, MD as Consulting Physician (Hematology and Oncology)  REASON FOR VISIT:  Follow up for treatment of squamous lung cancer.   HISTORY OF PRESENTING ILLNESS:  # Dec 2019 Stage I squamous lung cancer #s/p Bronchoscopy on 06/25/2018. subcarina EBUS FNA was non diagnostic, hypocellular specimen.  # 08/13/2018 CT guided right upper lobe biopsy pathology showed dense fibrosis and mixed inflammatory cells with prominent polytypic plasma cells component. Focal benign bronchial wall and alveolar spaces. No malignancy was identified.   # 08/13/2018 underwent right thoracotomy and wedge resection of a right upper lobe mass.  Frozen section was consistent with an inflammatory process.  On the second postop day, preliminary pathology reports high-grade malignancy.  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  08/20/2018 Patient was therefore taken back to the OR and underwent take complete lobectomy  pT1b pN0 cM0 stage I squamous cell lung cancer.  Margin is negative.  Recommend observation.    # 12/03/2018 CT chest w contrast showed local recurrence.  12/10/2018 PET showed No evidence of distant metastatic disease # 12/26/2018 s/p bronchoscopy biopsy. Confirmed local recurrence of squamous cell lung cancer.  medi port placed by Dr.Oaks.  Cancer treatment Started concurrent chemoradiation on 01/16/2019 Carboplatin AUC of 2 and Taxol 45 mg/m2 weekly finished in 03/05/2019.   INTERVAL HISTORY INMER NIX is a 54 y.o. male who has above history reviewed by me today presents for evaluation prior to immunotherapy for treatment of  recurrent  squamous cell lung cancer.  Patient has no new complaints.  He takes potassium and magnesium supplementation He denies any diarrhea, skin rash, cough, breathing  difficulties.  Review of Systems  Constitutional: Negative for appetite change, chills, fatigue, fever and unexpected weight change.  HENT:   Negative for hearing loss and voice change.   Eyes: Negative for eye problems and icterus.  Respiratory: Negative for chest tightness, cough and shortness of breath.   Cardiovascular: Negative for chest pain and leg swelling.  Gastrointestinal: Negative for abdominal distention and abdominal pain.  Endocrine: Negative for hot flashes.  Genitourinary: Negative for difficulty urinating, dysuria and frequency.   Musculoskeletal: Negative for arthralgias.  Skin: Negative for itching and rash.  Neurological: Negative for light-headedness and numbness.  Hematological: Negative for adenopathy. Does not bruise/bleed easily.  Psychiatric/Behavioral: Negative for confusion.    MEDICAL HISTORY:  Past Medical History:  Diagnosis Date  . Alcohol abuse    usually drinks 2-3 drinks per day  . Atherosclerosis 06/2018  . Chronic sinusitis   . Dehydration 02/07/2019  . Emphysema of lung ([place]) 06/2018   patient unaware of this.  . Hip fracture ([place]) 05/2018   no surgery  . History of kidney stones 05/2018   per xray, bilateral nephrolitiasis  . Hypertension   . Squamous cell carcinoma of lung, right ([place]) 06/2018    SURGICAL HISTORY: Past Surgical History:  Procedure Laterality Date  . BRAIN SURGERY  10/2017   nasal/sinus endoscopy. mass benign  . ELECTROMAGNETIC NAVIGATION BROCHOSCOPY Right 06/25/2018   Procedure: ELECTROMAGNETIC NAVIGATION BRONCHOSCOPY;  Surgeon: [name], MD;  Location: [facility] ORS;  Service: Cardiopulmonary;  Laterality: Right;  . ENDOBRONCHIAL ULTRASOUND Right 06/25/2018   Procedure: ENDOBRONCHIAL ULTRASOUND;  Surgeon: [name], MD;  Location: [facility] OR[facility];  Service: Cardiopulmonary;  Laterality: Right;  . ENDOBRONCHIAL ULTRASOUND Right 12/26/2018   Procedure: ENDOBRONCHIAL ULTRASOUND RIGHT;  Surgeon: [name], MD;  Location:  [facility] OR[facility];  Service: Cardiopulmonary;  Laterality: Right;  . FLEXIBLE BRONCHOSCOPY N/A 08/20/2018   Procedure: FLEXIBLE BRONCHOSCOPY PREOP;  Surgeon: [name], MD;  Location: [facility] ORS;  Service: Thoracic;  Laterality: N/A;  . IR CV LINE INJECTION  04/04/2019  . NASAL SINUS SURGERY  10/2017   At [facility], frontal sinusotomy, ethmoidectomy, resection anterior cranial fossa neoplasm, turbinate resection  . PORTACATH PLACEMENT Left 01/15/2019   Procedure: INSERTION PORT-A-CATH;  Surgeon: [name], MD;  Location: [facility] ORS;  Service: General;  Laterality: Left;  . THORACOTOMY Right 08/13/2018   Procedure: PREOP BROCHOSCOPY WITH RIGHT THORACOTOMY AND RUL RESECTION;  Surgeon: [name], MD;  Location: [facility] OR[facility];  Service: General;  Laterality: Right;  . THORACOTOMY Right 08/20/2018   Procedure: THORACOTOMY MAJOR RIGHT UPPER LOBE LOBECTOMY;  Surgeon: [name], MD;  Location: [facility] OR[facility];  Service: Thoracic;  Laterality: Right;  . TOE SURGERY Left    pin in left toe    SOCIAL HISTORY: Social History   Socioeconomic History  . Marital status: Married    Spouse name: lisa  . Number of children: Not on file  . Years of education: Not on file  . Highest education level: Not on file  Occupational History  . Occupation: welding    Comment: taking time off to resolve issues  Tobacco Use  . Smoking status: Former Smoker    Packs/day: 0.50    Years: 15.00    Pack years: 7.50    Types: Cigarettes    Quit date: 08/2018    Years since quitting: 1.3  . Smokeless tobacco: Never Used  Substance and Sexual Activity  . Alcohol use: Yes    Alcohol/week: 3.0 standard drinks    Types: 3 Cans of beer per week    Comment: usually 2 drinks per week, per patient  . Drug use: No  . Sexual activity: Not on file  Other Topics Concern  . Not on file  Social History Narrative  . Not on file   Social Determinants of Health   Financial Resource Strain:   . Difficulty of Paying Living Expenses:   Food  Insecurity:   . Worried About Charity fundraiser in the Last Year:   . Arboriculturist in the Last Year:   Transportation Needs:   . Film/video editor (Medical):   Marland Kitchen Lack of Transportation (Non-Medical):   Physical Activity:   . Days of Exercise per Week:   . Minutes of Exercise per Session:   Stress:   . Feeling of Stress :   Social Connections:   . Frequency of Communication with Friends and Family:   . Frequency of Social Gatherings with Friends and Family:   . Attends Religious Services:   . Active Member of Clubs or Organizations:   . Attends Archivist Meetings:   Marland Kitchen Marital Status:   Intimate Partner Violence:   . Fear of Current or Ex-Partner:   . Emotionally Abused:   Marland Kitchen Physically Abused:   . Sexually Abused:     FAMILY HISTORY: Family History  Problem Relation Age of Onset  . Breast cancer Mother   . Diabetes Mother   . Lung cancer Father   . Hypertension Father     ALLERGIES:  has No Known Allergies.  MEDICATIONS:  Current Outpatient Medications  Medication Sig Dispense Refill  . albuterol (VENTOLIN HFA) 108 (90 Base) MCG/ACT inhaler Inhale 2 puffs into the lungs every 6 (  six) hours as needed for wheezing or shortness of breath. 8 g 6  . cloNIDine (CATAPRES) 0.1 MG tablet Take 0.1 mg by mouth daily.   5  . diltiazem (CARDIZEM CD) 120 MG 24 hr capsule Take 120 mg by mouth daily.    . folic acid (V-R FOLIC ACID) 035 MCG tablet Take 1 tablet (400 mcg total) by mouth daily. 90 tablet 1  . hydrALAZINE (APRESOLINE) 100 MG tablet Take 50 mg by mouth 2 (two) times daily.     . hydrochlorothiazide (HYDRODIURIL) 12.5 MG tablet Take 12.5 mg by mouth daily.     Marland Kitchen KLOR-CON M20 20 MEQ tablet TAKE 1 TABLET BY MOUTH TWICE A DAY 60 tablet 0  . latanoprost (XALATAN) 0.005 % ophthalmic solution 1 drop at bedtime.    . lidocaine-prilocaine (EMLA) cream Apply to affected area once 30 g 3  . magnesium chloride (SLOW-MAG) 64 MG TBEC SR tablet TAKE 1 TABLET (64 MG  TOTAL) BY MOUTH DAILY. (Patient taking differently: Take 1 tablet by mouth 2 (two) times daily. TAKE 1 TABLET (64 MG TOTAL) BY MOUTH DAILY.) 90 tablet 0  . methocarbamol (ROBAXIN) 500 MG tablet Take 1 tablet (500 mg total) by mouth at bedtime. 30 tablet 0  . olmesartan (BENICAR) 40 MG tablet Take 40 mg by mouth every other day.     . ondansetron (ZOFRAN) 8 MG tablet Take 1 tablet (8 mg total) by mouth 2 (two) times daily as needed for refractory nausea / vomiting. Start on day 3 after chemo. 30 tablet 1  . oxyCODONE-acetaminophen (PERCOCET) 5-325 MG tablet Take 1-2 tablets by mouth every 4 (four) hours as needed for severe pain. 20 tablet 0  . prochlorperazine (COMPAZINE) 10 MG tablet Take 1 tablet (10 mg total) by mouth every 6 (six) hours as needed (Nausea or vomiting). 30 tablet 1  . vitamin B-12 (CYANOCOBALAMIN) 1000 MCG tablet Take 1 tablet (1,000 mcg total) by mouth daily. 90 tablet 1   No current facility-administered medications for this visit.   Facility-Administered Medications Ordered in Other Visits  Medication Dose Route Frequency Provider Last Rate Last Admin  . albuterol (PROVENTIL) (2.5 MG/3ML) 0.083% nebulizer solution 2.5 mg  2.5 mg Nebulization Once System, Provider Not In      . heparin lock flush 100 unit/mL  500 Units Intravenous Once Earlie Server, MD      . sodium chloride flush (NS) 0.9 % injection 10 mL  10 mL Intravenous PRN Earlie Server, MD   10 mL at 04/04/19 0903  . sodium chloride flush (NS) 0.9 % injection 10 mL  10 mL Intracatheter PRN Earlie Server, MD   10 mL at 12/10/19 1038     PHYSICAL EXAMINATION: ECOG PERFORMANCE STATUS: 0 - Asymptomatic Vitals:   12/10/19 0932  BP: (!) 149/94  Pulse: 88  Resp: 18  Temp: (!) 96.2 F (35.7 C)  SpO2: 100%   Filed Weights   12/10/19 0932  Weight: 145 lb 12.8 oz (66.1 kg)    Physical Exam Constitutional:      General: He is not in acute distress. HENT:     Head: Normocephalic and atraumatic.  Eyes:     General: No  scleral icterus. Cardiovascular:     Rate and Rhythm: Normal rate and regular rhythm.     Heart sounds: Normal heart sounds.  Pulmonary:     Effort: Pulmonary effort is normal. No respiratory distress.     Breath sounds: No wheezing.  Abdominal:  General: Bowel sounds are normal. There is no distension.     Palpations: Abdomen is soft.  Musculoskeletal:        General: No deformity. Normal range of motion.     Cervical back: Normal range of motion and neck supple.  Skin:    General: Skin is warm and dry.     Findings: No erythema or rash.  Neurological:     General: No focal deficit present.     Mental Status: He is alert and oriented to person, place, and time. Mental status is at baseline.     Cranial Nerves: No cranial nerve deficit.     Coordination: Coordination normal.  Psychiatric:        Mood and Affect: Mood normal.      LABORATORY DATA:  I have reviewed the data as listed Lab Results  Component Value Date   WBC 7.8 12/10/2019   HGB 15.1 12/10/2019   HCT 42.6 12/10/2019   MCV 103.6 (H) 12/10/2019   PLT 153 12/10/2019   Recent Labs    11/10/19 0824 11/24/19 0810 12/10/19 0844  NA 136 137 142  K 3.7 3.3* 3.4*  CL 101 103 104  CO2 _0 GLUCOSE 118* 137* 105*  BUN _1 CREATININE 1.00 0.94 0.78  CALCIUM 9.0 9.1 9.1  GFRNONAA >60 >60 >60  GFRAA >60 >60 >60  PROT 7.1 7.3 7.5  ALBUMIN 3.6 3.6 3.7  AST _2 ALT _3 ALKPHOS 81 92 89  BILITOT 0.7 0.9 0.6   Iron/TIBC/Ferritin/ %Sat No results found for: IRON, TIBC, FERRITIN, IRONPCTSAT   Pathology 12/26/2018 Right hilar adenopathy/mass-EUS guided FNA Positive for malignancy, non small cell carcinoma, morphologically similar to previous right upper lobe squamous cell carcinoma.   ASSESSMENT & PLAN:  1. Squamous cell carcinoma of lung, right ([place])   2. Hypomagnesemia   3. Hypokalemia   4. Encounter for antineoplastic immunotherapy    #Recurrent Squamous cell carcinoma of lung,    Currently on maintenance immunotherapy with durvalumab every 2 weeks.. Patient is clinically doing well.  He tolerates immunotherapy maintenance. Labs are reviewed and discussed with patient. Proceed with durvalumab treatment today. Most recent CT surveillance scan shows small lung nodules.  Repeat CT scan in 3 months  #Chronic hypomagnesium.  Continue slow mag twice daily.  Magnesium level is 1.5 today.  Patient will receive 2 g of IV magnesium sulfate today. #Chronic hypokalemia, potassium 3.4 today.  Continue potassium 20 mEq twice daily. . Return of visit: 2 weeks.    Earlie Server, MD, PhD Hematology Oncology [facility] 12/10/2019

## 2019-12-15 IMAGING — DX DG CHEST 1V PORT
1 series · 1 of 1 positions shown · non-contrast
Comparison: Chest x-ray 08/20/2018.

CLINICAL DATA: 53-year-old male status post thoracotomy. Follow-up
study.

EXAM:
PORTABLE CHEST 1 VIEW

[chest ap]
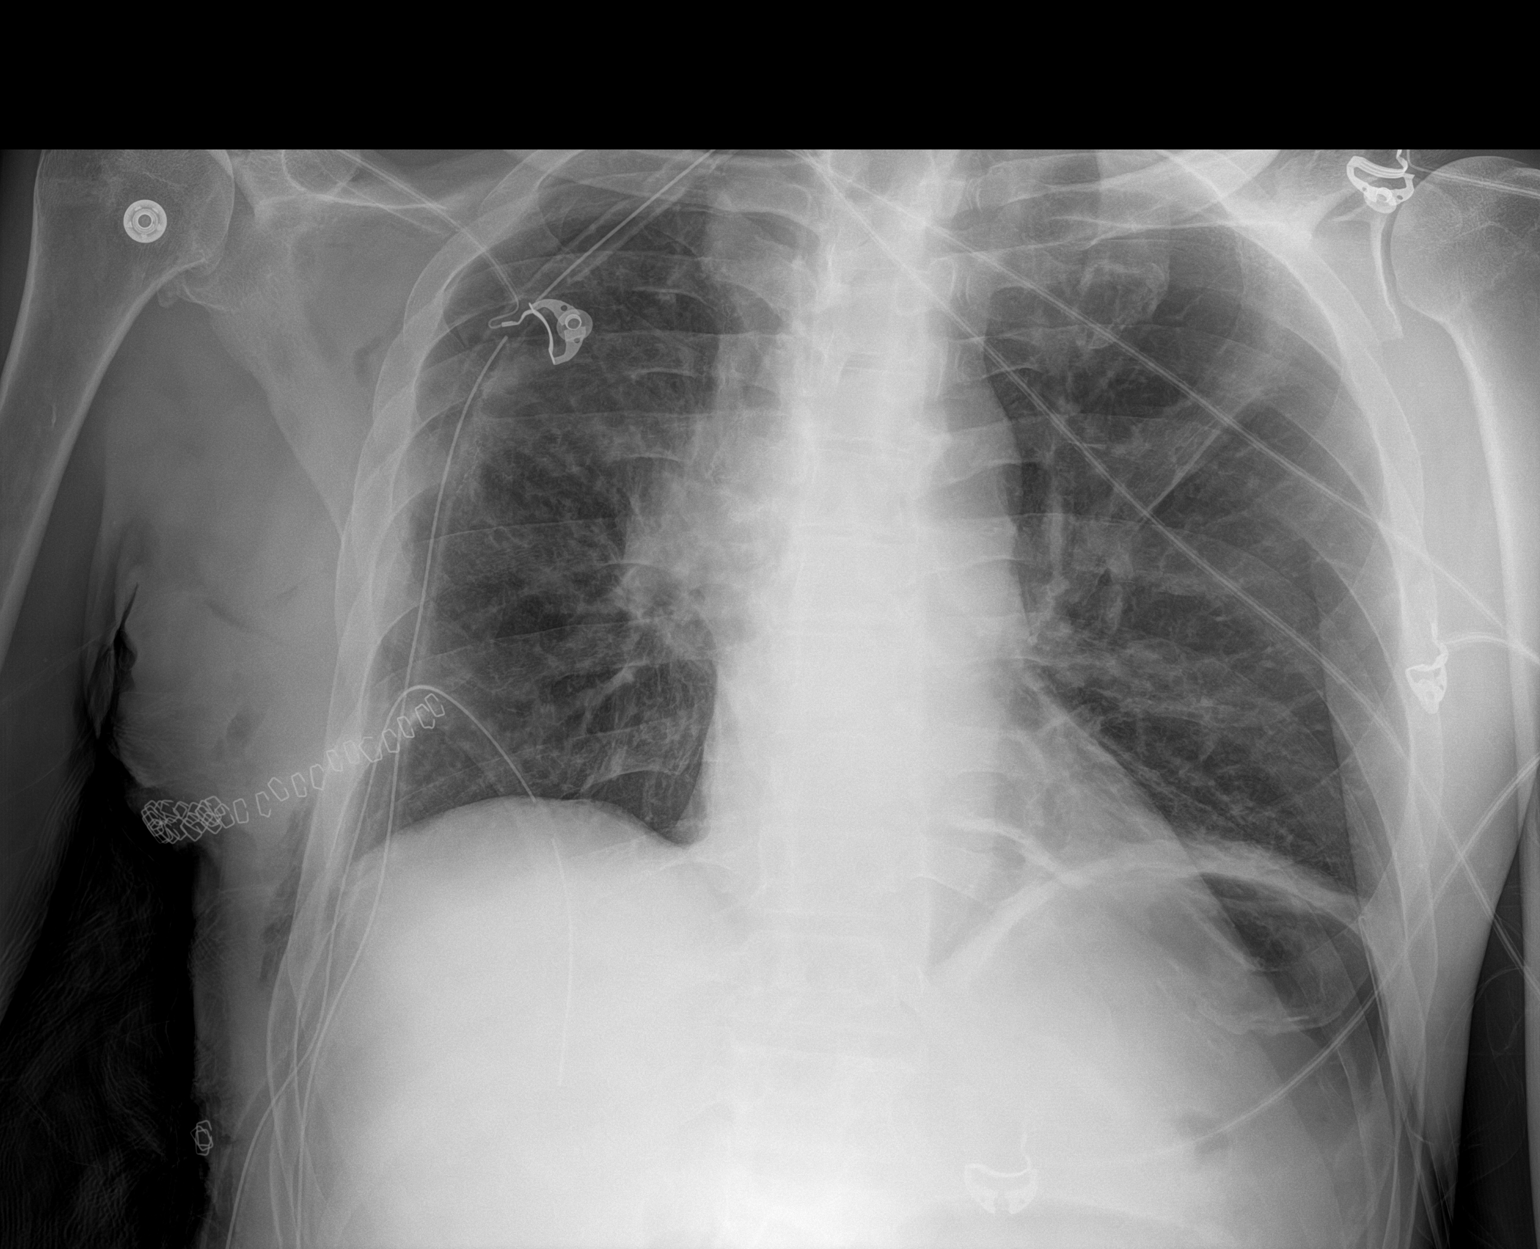

[1 of 1 positions shown; findings below may reference images not displayed]

FINDINGS: Postthoracotomy changes from prior right upper lobectomy again noted
in the right hemithorax with 2 right-sided chest tubes in place,
with one directed in the apex of the right hemithorax and one near
the base. No appreciable pneumothorax. No acute consolidative
airspace disease. No pleural effusions. Scarring and/or atelectasis
in the left base, similar to the prior study. No evidence of
pulmonary edema. Heart size is normal. Fullness in the right
suprahilar region related to prior surgery. Subcutaneous gas
throughout the right chest wall and in the right cervical region.
IMPRESSION: 1. Postoperative changes and support apparatus, as above.
2. Persistent left basilar atelectasis and/or scarring.

## 2019-12-16 IMAGING — CR DG CHEST 2V
1 series · 2 of 2 positions shown · non-contrast
Comparison: 08/21/2018 and older studies.

CLINICAL DATA: Status post thoracotomy and right upper lobe
resection performed on 08/20/2018. Follow-up exam.

EXAM:
CHEST - 2 VIEW

[Series 1: dg chest 2 view · 0.14mm/px · 2 of 2 slices shown]
[im 1/2]
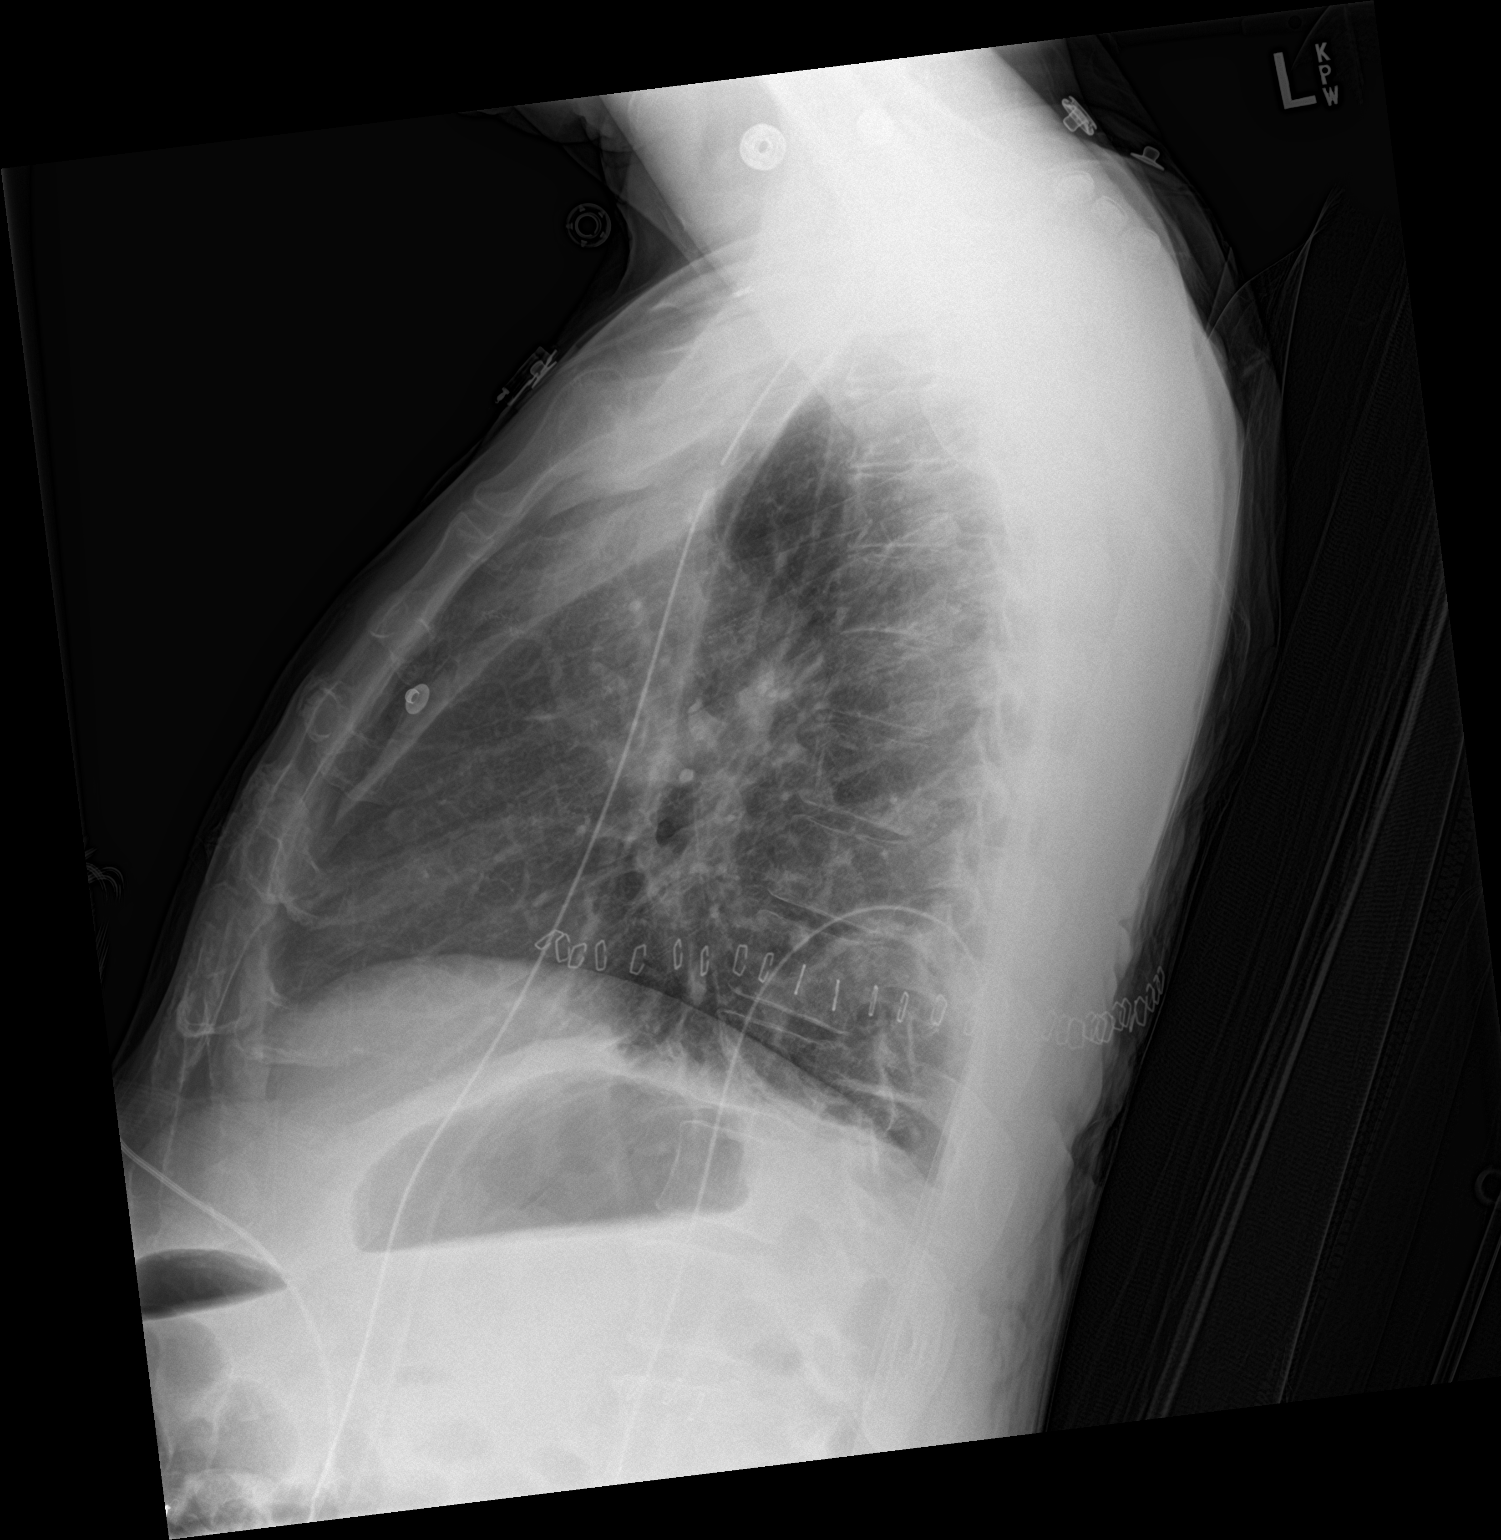
[im 2/2]
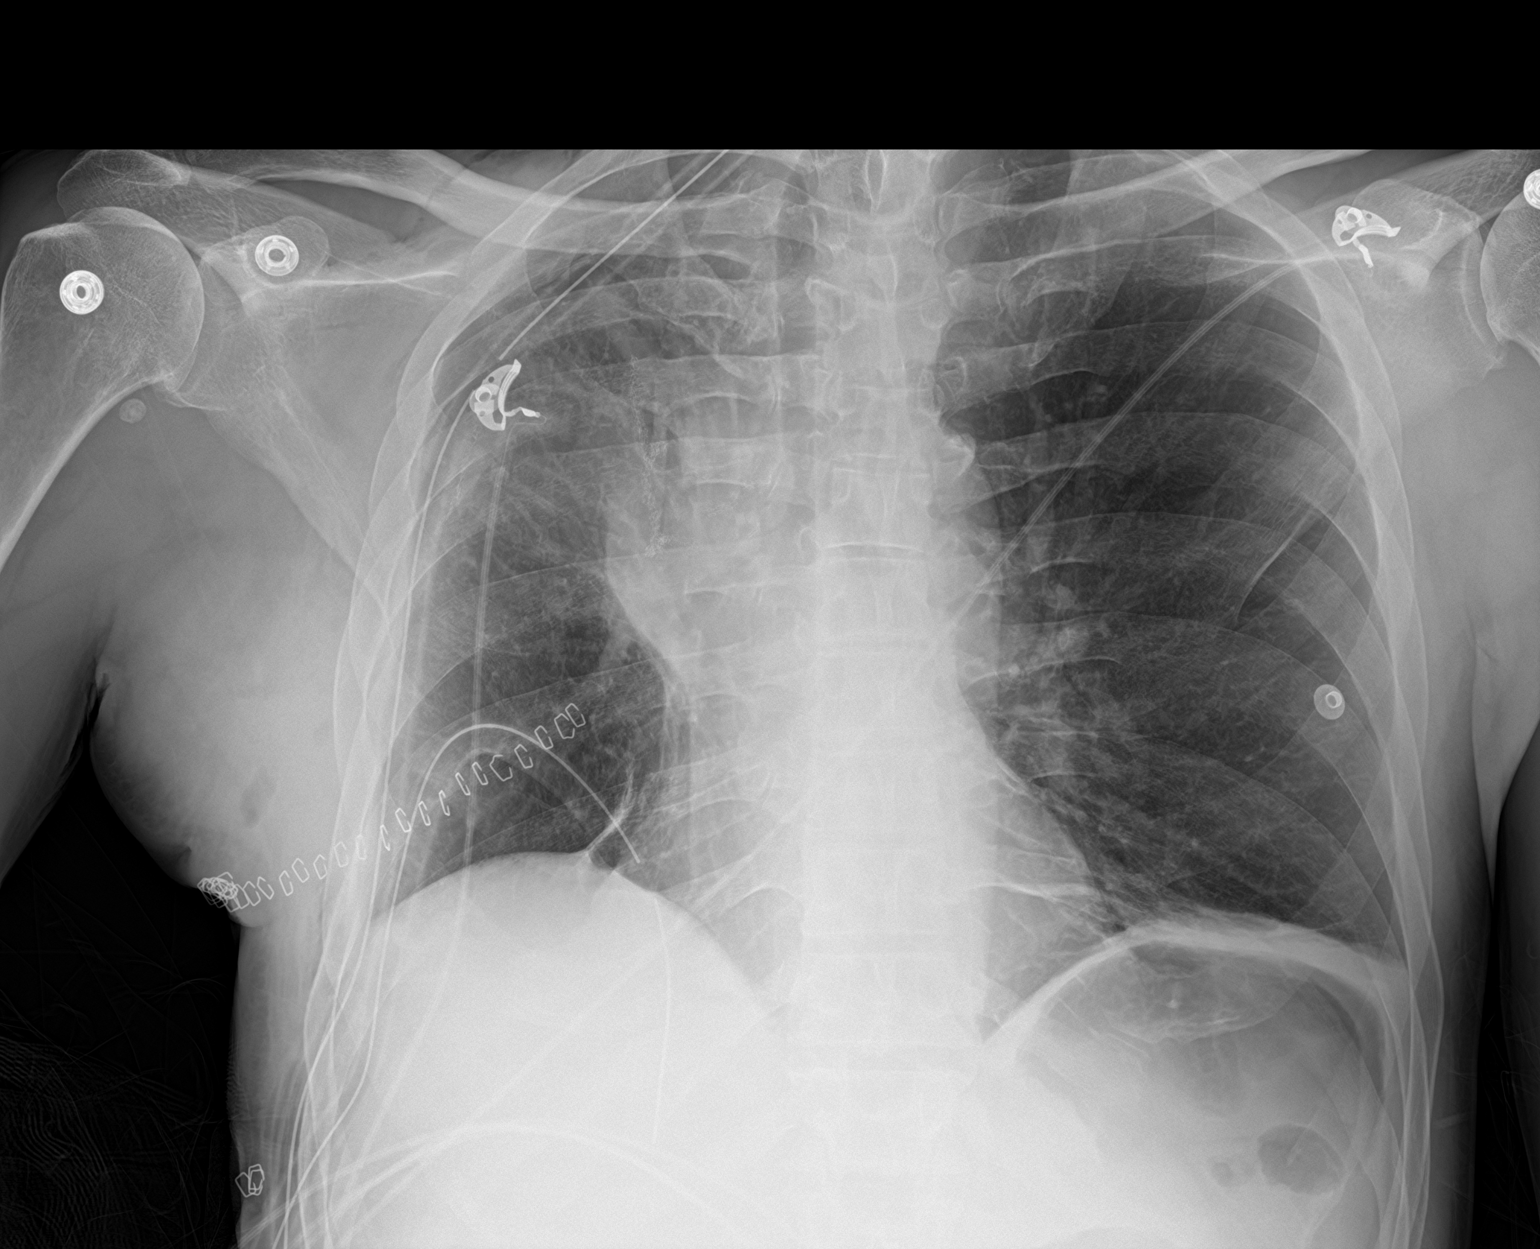

[2 of 2 positions shown; findings below may reference images not displayed]

FINDINGS: Two right-sided chest tubes, 1 with tip at the right apex the other
directed inferiorly, are stable. There stable right sided pulmonary
anastomosis staples. Mild hazy opacity extends from the right hilum,
likely atelectasis. Mild linear atelectasis is noted at both lung
bases. Remainder of the lungs is clear. There is volume loss on the
right with mild shift of the mediastinal structures to the right.
There is relative hyperexpansion of the left lung.

No pneumothorax.

Subcutaneous emphysema has mildly decreased from the previous day's
study.
IMPRESSION: 1. No acute abnormalities or evidence of an operative complication.
2. Postoperative changes from the right thoracotomy and upper lobe
resection, similar to the previous day's study. No pneumothorax.
3. Mild linear basilar atelectasis.

## 2019-12-18 DIAGNOSIS — R000 Tachycardia, unspecified: Secondary | ICD-10-CM | POA: Insufficient documentation

## 2019-12-23 IMAGING — CR DG CHEST 2V
1 series · 2 of 2 positions shown · non-contrast
Comparison: Chest radiograph August 24, 2018

CLINICAL DATA: Status post thoracotomy for lung mass.

EXAM:
CHEST - 2 VIEW

[Series 1: dg chest 2 view · 0.14mm/px · 2 of 2 slices shown]
[im 1/2]
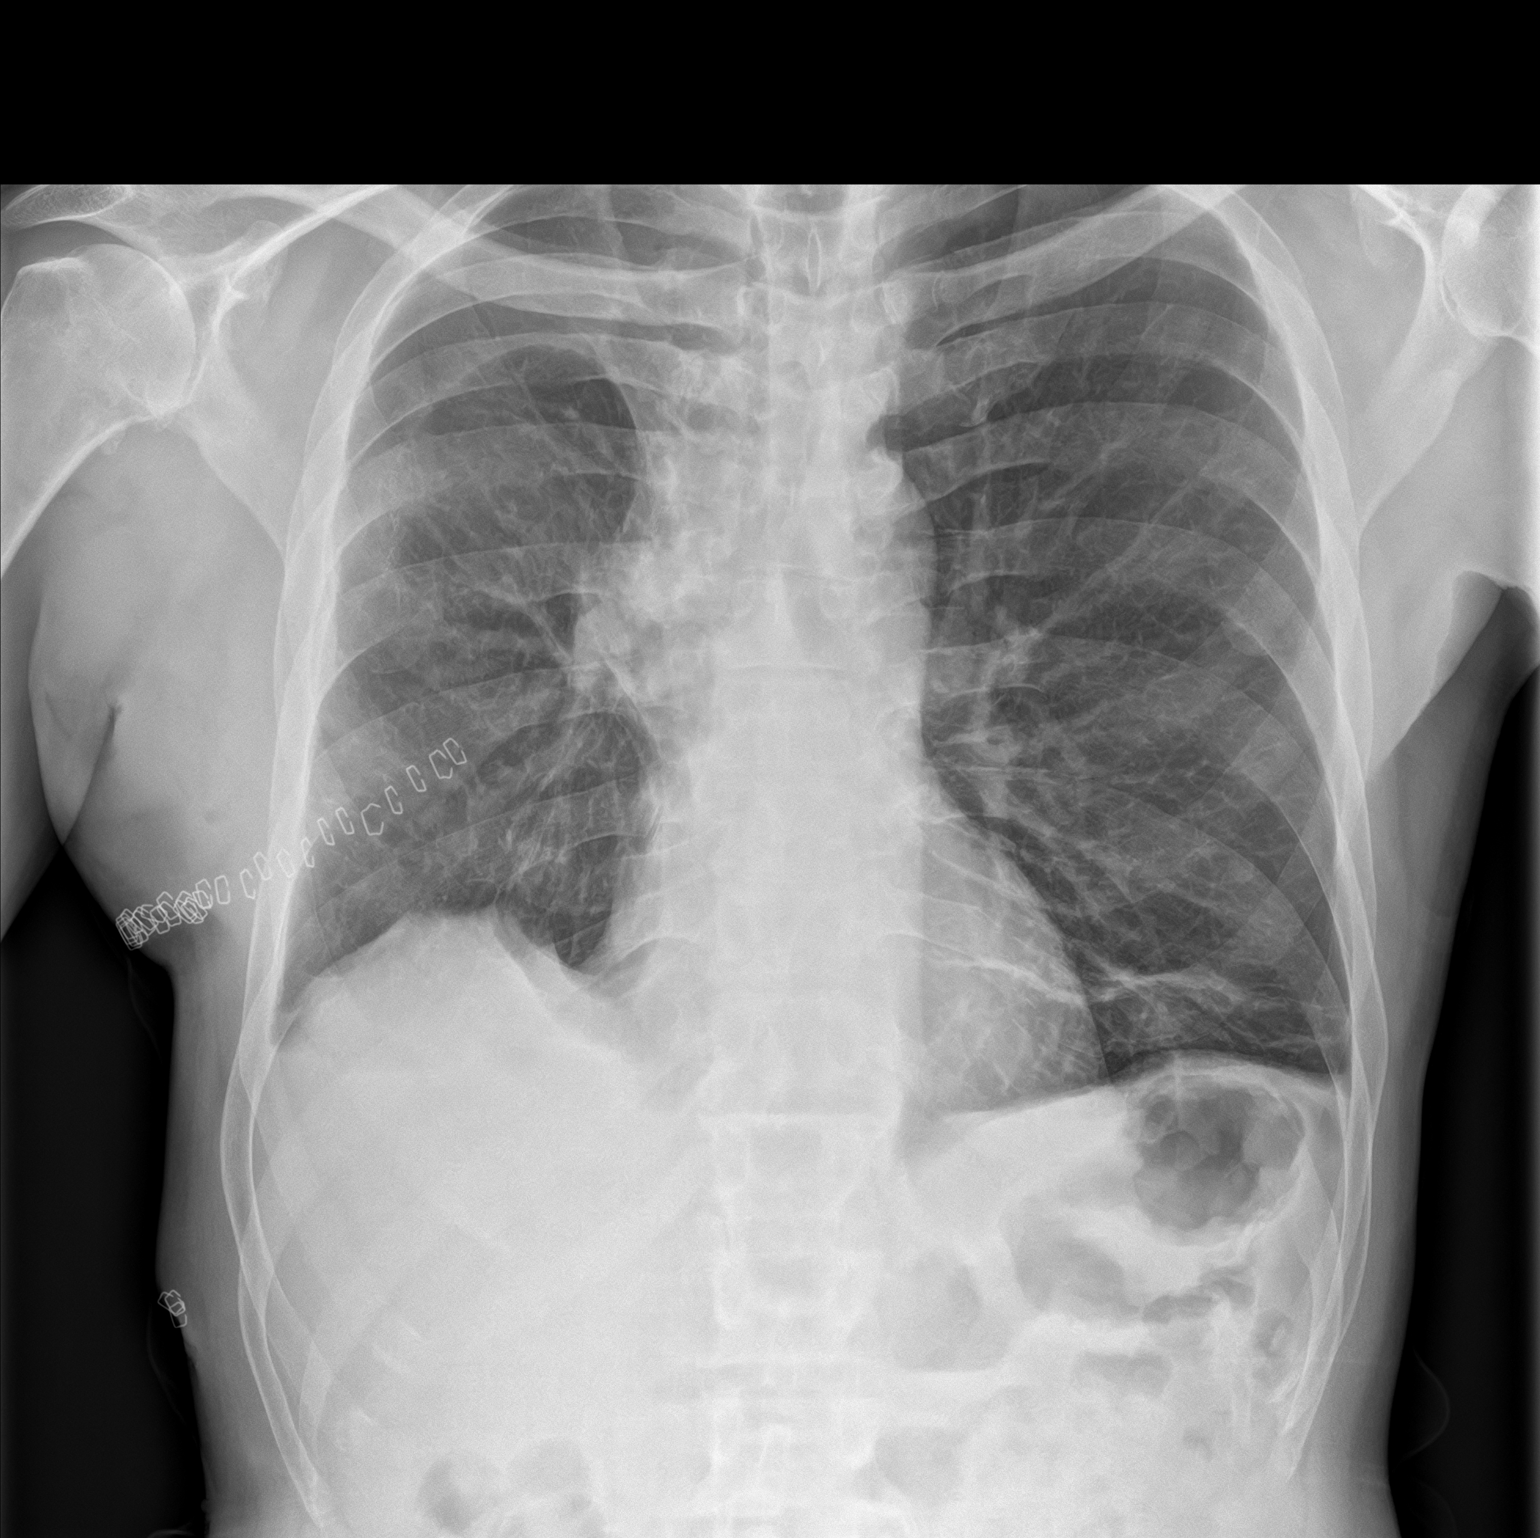
[im 2/2]
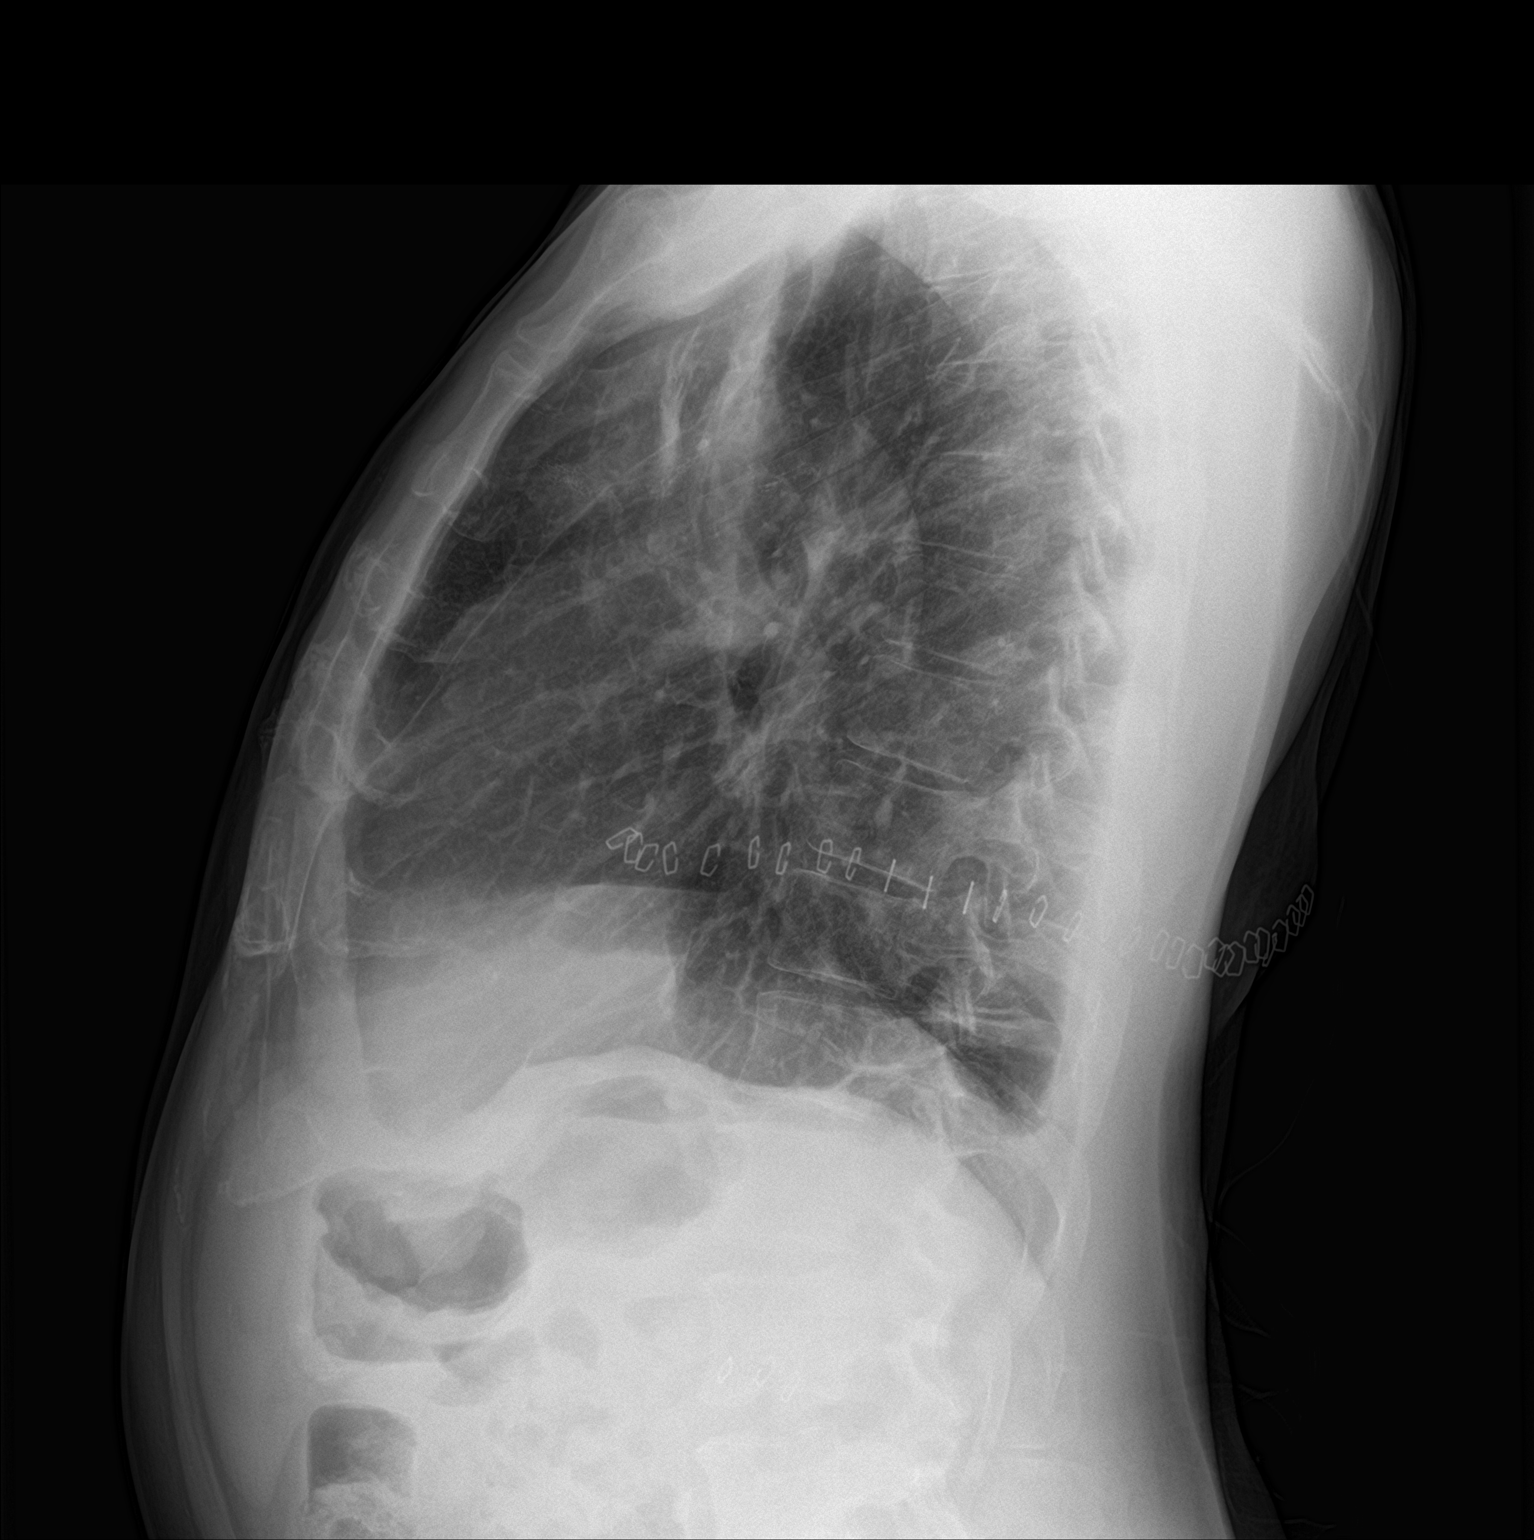

[2 of 2 positions shown; findings below may reference images not displayed]

FINDINGS: Interval removal of RIGHT chest tubes. Postsurgical changes RIGHT
lung with volume loss. Similar LEFT lung base strandy densities.
Small residual RIGHT pleural effusion. No pneumothorax. Cardiac
silhouette is normal. Fullness of the RIGHT hilum. RIGHT chest wall
swelling and subcutaneous gas with overlying skin staples.
IMPRESSION: 1. Postsurgical changes RIGHT lung without pneumothorax.
2. Fullness of RIGHT hila most compatible with lymphadenopathy,
unchanged.
3. LEFT lung base atelectasis/scarring.

## 2019-12-24 ENCOUNTER — Inpatient Hospital Stay (HOSPITAL_BASED_OUTPATIENT_CLINIC_OR_DEPARTMENT_OTHER): Payer: Managed Care, Other (non HMO) | Admitting: Oncology

## 2019-12-24 ENCOUNTER — Other Ambulatory Visit: Payer: Self-pay

## 2019-12-24 ENCOUNTER — Inpatient Hospital Stay: Payer: Managed Care, Other (non HMO)

## 2019-12-24 ENCOUNTER — Encounter: Payer: Self-pay | Admitting: Oncology

## 2019-12-24 VITALS — BP 164/100 | HR 87 | Temp 97.2°F | Resp 20 | Wt 145.3 lb

## 2019-12-24 VITALS — BP 154/92 | HR 84 | Resp 18

## 2019-12-24 DIAGNOSIS — Z95828 Presence of other vascular implants and grafts: Secondary | ICD-10-CM

## 2019-12-24 DIAGNOSIS — D696 Thrombocytopenia, unspecified: Secondary | ICD-10-CM

## 2019-12-24 DIAGNOSIS — C3491 Malignant neoplasm of unspecified part of right bronchus or lung: Secondary | ICD-10-CM

## 2019-12-24 DIAGNOSIS — E876 Hypokalemia: Secondary | ICD-10-CM

## 2019-12-24 DIAGNOSIS — Z5112 Encounter for antineoplastic immunotherapy: Secondary | ICD-10-CM

## 2019-12-24 DIAGNOSIS — E860 Dehydration: Secondary | ICD-10-CM

## 2019-12-24 LAB — COMPREHENSIVE METABOLIC PANEL
ALT: 26 U/L (ref 0–44)
AST: 42 U/L — ABNORMAL HIGH (ref 15–41)
Albumin: 3.6 g/dL (ref 3.5–5.0)
Alkaline Phosphatase: 82 U/L (ref 38–126)
Anion gap: 12 (ref 5–15)
BUN: 12 mg/dL (ref 6–20)
CO2: 27 mmol/L (ref 22–32)
Calcium: 9 mg/dL (ref 8.9–10.3)
Chloride: 102 mmol/L (ref 98–111)
Creatinine, Ser: 1.01 mg/dL (ref 0.61–1.24)
GFR calc Af Amer: >60 mL/min (ref >60)
GFR calc non Af Amer: >60 mL/min (ref >60)
Glucose, Bld: 124 mg/dL — ABNORMAL HIGH (ref 70–99)
Potassium: 3.4 mmol/L — ABNORMAL LOW (ref 3.5–5.1)
Sodium: 141 mmol/L (ref 135–145)
Total Bilirubin: 0.6 mg/dL (ref 0.3–1.2)
Total Protein: 7.4 g/dL (ref 6.5–8.1)

## 2019-12-24 LAB — CBC WITH DIFFERENTIAL/PLATELET
Abs Immature Granulocytes: 0.05 10*3/uL (ref 0.00–0.07)
Basophils Absolute: 0.1 10*3/uL (ref 0.0–0.1)
Basophils Relative: 1 %
Eosinophils Absolute: 0.2 10*3/uL (ref 0.0–0.5)
Eosinophils Relative: 2 %
HCT: 42.3 % (ref 39.0–52.0)
Hemoglobin: 15.1 g/dL (ref 13.0–17.0)
Immature Granulocytes: 1 %
Lymphocytes Relative: 9 %
Lymphs Abs: 0.9 10*3/uL (ref 0.7–4.0)
MCH: 37.3 pg — ABNORMAL HIGH (ref 26.0–34.0)
MCHC: 35.7 g/dL (ref 30.0–36.0)
MCV: 104.4 fL — ABNORMAL HIGH (ref 80.0–100.0)
Monocytes Absolute: 0.7 10*3/uL (ref 0.1–1.0)
Monocytes Relative: 7 %
Neutro Abs: 7.4 10*3/uL (ref 1.7–7.7)
Neutrophils Relative %: 80 %
Platelets: 148 10*3/uL — ABNORMAL LOW (ref 150–400)
RBC: 4.05 MIL/uL — ABNORMAL LOW (ref 4.22–5.81)
RDW: 12.8 % (ref 11.5–15.5)
WBC: 9.2 10*3/uL (ref 4.0–10.5)
nRBC: 0 % (ref 0.0–0.2)

## 2019-12-24 LAB — MAGNESIUM: Magnesium: 1.5 mg/dL — ABNORMAL LOW (ref 1.7–2.4)

## 2019-12-24 LAB — TSH: TSH: 0.69 u[IU]/mL (ref 0.350–4.500)

## 2019-12-24 MED ORDER — SLOW-MAG 71.5-119 MG PO TBEC: 1.0000 | DELAYED_RELEASE_TABLET | Freq: Two times a day (BID) | ORAL | 1 refills | Status: DC

## 2019-12-24 MED ORDER — HEPARIN SOD (PORK) LOCK FLUSH 100 UNIT/ML IV SOLN
INTRAVENOUS | Status: AC
  Filled 2019-12-24: qty 5

## 2019-12-24 MED ORDER — HEPARIN SOD (PORK) LOCK FLUSH 100 UNIT/ML IV SOLN
500.0000 [IU] | Freq: Once | INTRAVENOUS | Status: AC | PRN
  Administered 2019-12-24: 500 [IU]
  Filled 2019-12-24: qty 5

## 2019-12-24 MED ORDER — MAGNESIUM SULFATE 2 GM/50ML IV SOLN
2.0000 g | Freq: Once | INTRAVENOUS | Status: AC
  Administered 2019-12-24: 2 g via INTRAVENOUS
  Filled 2019-12-24: qty 50

## 2019-12-24 MED ORDER — SODIUM CHLORIDE 0.9 % IV SOLN
10.0000 mg/kg | Freq: Once | INTRAVENOUS | Status: AC
  Administered 2019-12-24: 620 mg via INTRAVENOUS
  Filled 2019-12-24: qty 10

## 2019-12-24 MED ORDER — SODIUM CHLORIDE 0.9 % IV SOLN
Freq: Once | INTRAVENOUS | Status: AC
  Filled 2019-12-24: qty 250

## 2019-12-24 NOTE — Progress Notes (Signed)
Hematology/Oncology Follow up note [facility] [phone] [phone]   Patient Care Team: Tracie Harrier, MD as PCP - General (Internal Medicine) Earlie Server, MD as Consulting Physician (Hematology and Oncology)  REASON FOR VISIT:  Follow up for treatment of squamous lung cancer.   HISTORY OF PRESENTING ILLNESS:  # Dec 2019 Stage I squamous lung cancer #s/p Bronchoscopy on 06/25/2018. subcarina EBUS FNA was non diagnostic, hypocellular specimen.  # 08/13/2018 CT guided right upper lobe biopsy pathology showed dense fibrosis and mixed inflammatory cells with prominent polytypic plasma cells component. Focal benign bronchial wall and alveolar spaces. No malignancy was identified.   # 08/13/2018 underwent right thoracotomy and wedge resection of a right upper lobe mass.  Frozen section was consistent with an inflammatory process.  On the second postop day, preliminary pathology reports high-grade malignancy.  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  08/20/2018 Patient was therefore taken back to the OR and underwent take complete lobectomy  pT1b pN0 cM0 stage I squamous cell lung cancer.  Margin is negative.  Recommend observation.    # 12/03/2018 CT chest w contrast showed local recurrence.  12/10/2018 PET showed No evidence of distant metastatic disease # 12/26/2018 s/p bronchoscopy biopsy. Confirmed local recurrence of squamous cell lung cancer.  medi port placed by Dr.Oaks.  Cancer treatment Started concurrent chemoradiation on 01/16/2019 Carboplatin AUC of 2 and Taxol 45 mg/m2 weekly finished in 03/05/2019.   INTERVAL HISTORY Samuel Choi is a 54 y.o. male who has above history reviewed by me today presents for evaluation prior to immunotherapy for treatment of  recurrent  squamous cell lung cancer.  He takes potassium and magnesium supplementation He denies any diarrhea, skin rash, cough, breathing difficulties. No new complaints.    Review of Systems  Constitutional: Negative for appetite change, chills, fatigue, fever and unexpected weight change.  HENT:   Negative for hearing loss and voice change.   Eyes: Negative for eye problems and icterus.  Respiratory: Negative for chest tightness, cough and shortness of breath.   Cardiovascular: Negative for chest pain and leg swelling.  Gastrointestinal: Negative for abdominal distention and abdominal pain.  Endocrine: Negative for hot flashes.  Genitourinary: Negative for difficulty urinating, dysuria and frequency.   Musculoskeletal: Negative for arthralgias.  Skin: Negative for itching and rash.  Neurological: Negative for light-headedness and numbness.  Hematological: Negative for adenopathy. Does not bruise/bleed easily.  Psychiatric/Behavioral: Negative for confusion.    MEDICAL HISTORY:  Past Medical History:  Diagnosis Date  . Alcohol abuse    usually drinks 2-3 drinks per day  . Atherosclerosis 06/2018  . Chronic sinusitis   . Dehydration 02/07/2019  . Emphysema of lung ([place]) 06/2018   patient unaware of this.  . Hip fracture ([place]) 05/2018   no surgery  . History of kidney stones 05/2018   per xray, bilateral nephrolitiasis  . Hypertension   . Squamous cell carcinoma of lung, right ([place]) 06/2018    SURGICAL HISTORY: Past Surgical History:  Procedure Laterality Date  . BRAIN SURGERY  10/2017   nasal/sinus endoscopy. mass benign  . ELECTROMAGNETIC NAVIGATION BROCHOSCOPY Right 06/25/2018   Procedure: ELECTROMAGNETIC NAVIGATION BRONCHOSCOPY;  Surgeon: [name], MD;  Location: [facility] ORS;  Service: Cardiopulmonary;  Laterality: Right;  . ENDOBRONCHIAL ULTRASOUND Right 06/25/2018   Procedure: ENDOBRONCHIAL ULTRASOUND;  Surgeon: [name], MD;  Location: [facility] OR[facility];  Service: Cardiopulmonary;  Laterality: Right;  . ENDOBRONCHIAL ULTRASOUND Right 12/26/2018   Procedure: ENDOBRONCHIAL ULTRASOUND RIGHT;  Surgeon: Mortimer Fries,  Maretta Bees, MD;  Location: [facility] OR[facility];   Service: Cardiopulmonary;  Laterality: Right;  . FLEXIBLE BRONCHOSCOPY N/A 08/20/2018   Procedure: FLEXIBLE BRONCHOSCOPY PREOP;  Surgeon: [name], MD;  Location: [facility] OR[facility];  Service: Thoracic;  Laterality: N/A;  . IR CV LINE INJECTION  04/04/2019  . NASAL SINUS SURGERY  10/2017   At [facility], frontal sinusotomy, ethmoidectomy, resection anterior cranial fossa neoplasm, turbinate resection  . PORTACATH PLACEMENT Left 01/15/2019   Procedure: INSERTION PORT-A-CATH;  Surgeon: [name], MD;  Location: [facility] OR[facility];  Service: General;  Laterality: Left;  . THORACOTOMY Right 08/13/2018   Procedure: PREOP BROCHOSCOPY WITH RIGHT THORACOTOMY AND RUL RESECTION;  Surgeon: [name], MD;  Location: [facility] OR[facility];  Service: General;  Laterality: Right;  . THORACOTOMY Right 08/20/2018   Procedure: THORACOTOMY MAJOR RIGHT UPPER LOBE LOBECTOMY;  Surgeon: [name], MD;  Location: [facility] OR[facility];  Service: Thoracic;  Laterality: Right;  . TOE SURGERY Left    pin in left toe    SOCIAL HISTORY: Social History   Socioeconomic History  . Marital status: Married    Spouse name: lisa  . Number of children: Not on file  . Years of education: Not on file  . Highest education level: Not on file  Occupational History  . Occupation: welding    Comment: taking time off to resolve issues  Tobacco Use  . Smoking status: Former Smoker    Packs/day: 0.50    Years: 15.00    Pack years: 7.50    Types: Cigarettes    Quit date: 08/2018    Years since quitting: 1.3  . Smokeless tobacco: Never Used  Vaping Use  . Vaping Use: Never used  Substance and Sexual Activity  . Alcohol use: Yes    Alcohol/week: 3.0 standard drinks    Types: 3 Cans of beer per week    Comment: usually 2 drinks per week, per patient  . Drug use: No  . Sexual activity: Not on file  Other Topics Concern  . Not on file  Social History Narrative  . Not on file   Social Determinants of Health   Financial Resource Strain:   . Difficulty of  Paying Living Expenses:   Food Insecurity:   . Worried About Charity fundraiser in the Last Year:   . Arboriculturist in the Last Year:   Transportation Needs:   . Film/video editor (Medical):   Marland Kitchen Lack of Transportation (Non-Medical):   Physical Activity:   . Days of Exercise per Week:   . Minutes of Exercise per Session:   Stress:   . Feeling of Stress :   Social Connections:   . Frequency of Communication with Friends and Family:   . Frequency of Social Gatherings with Friends and Family:   . Attends Religious Services:   . Active Member of Clubs or Organizations:   . Attends Archivist Meetings:   Marland Kitchen Marital Status:   Intimate Partner Violence:   . Fear of Current or Ex-Partner:   . Emotionally Abused:   Marland Kitchen Physically Abused:   . Sexually Abused:     FAMILY HISTORY: Family History  Problem Relation Age of Onset  . Breast cancer Mother   . Diabetes Mother   . Lung cancer Father   . Hypertension Father     ALLERGIES:  has No Known Allergies.  MEDICATIONS:  Current Outpatient Medications  Medication Sig Dispense Refill  . albuterol (VENTOLIN HFA) 108 (90 Base) MCG/ACT inhaler Inhale  2 puffs into the lungs every 6 (six) hours as needed for wheezing or shortness of breath. 8 g 6  . cloNIDine (CATAPRES) 0.1 MG tablet Take 0.1 mg by mouth daily.   5  . diltiazem (CARDIZEM CD) 120 MG 24 hr capsule Take 120 mg by mouth daily.    . folic acid (V-R FOLIC ACID) 762 MCG tablet Take 1 tablet (400 mcg total) by mouth daily. 90 tablet 1  . hydrALAZINE (APRESOLINE) 100 MG tablet Take 50 mg by mouth 2 (two) times daily.     . hydrochlorothiazide (HYDRODIURIL) 12.5 MG tablet Take 12.5 mg by mouth daily.     Marland Kitchen KLOR-CON M20 20 MEQ tablet TAKE 1 TABLET BY MOUTH TWICE A DAY 60 tablet 0  . latanoprost (XALATAN) 0.005 % ophthalmic solution 1 drop at bedtime.    . lidocaine-prilocaine (EMLA) cream Apply to affected area once 30 g 3  . magnesium chloride (SLOW-MAG) 64 MG TBEC  SR tablet TAKE 1 TABLET (64 MG TOTAL) BY MOUTH DAILY. (Patient taking differently: Take 1 tablet by mouth 2 (two) times daily. TAKE 1 TABLET (64 MG TOTAL) BY MOUTH DAILY.) 90 tablet 0  . methocarbamol (ROBAXIN) 500 MG tablet Take 1 tablet (500 mg total) by mouth at bedtime. 30 tablet 0  . olmesartan (BENICAR) 40 MG tablet Take 40 mg by mouth every other day.     . ondansetron (ZOFRAN) 8 MG tablet Take 1 tablet (8 mg total) by mouth 2 (two) times daily as needed for refractory nausea / vomiting. Start on day 3 after chemo. 30 tablet 1  . oxyCODONE-acetaminophen (PERCOCET) 5-325 MG tablet Take 1-2 tablets by mouth every 4 (four) hours as needed for severe pain. 20 tablet 0  . prochlorperazine (COMPAZINE) 10 MG tablet Take 1 tablet (10 mg total) by mouth every 6 (six) hours as needed (Nausea or vomiting). 30 tablet 1  . vitamin B-12 (CYANOCOBALAMIN) 1000 MCG tablet Take 1 tablet (1,000 mcg total) by mouth daily. 90 tablet 1   No current facility-administered medications for this visit.   Facility-Administered Medications Ordered in Other Visits  Medication Dose Route Frequency Provider Last Rate Last Admin  . albuterol (PROVENTIL) (2.5 MG/3ML) 0.083% nebulizer solution 2.5 mg  2.5 mg Nebulization Once System, Provider Not In      . heparin lock flush 100 unit/mL  500 Units Intravenous Once Earlie Server, MD      . sodium chloride flush (NS) 0.9 % injection 10 mL  10 mL Intravenous PRN Earlie Server, MD   10 mL at 04/04/19 0903     PHYSICAL EXAMINATION: ECOG PERFORMANCE STATUS: 0 - Asymptomatic Vitals:   12/24/19 0917  BP: (!) 164/100  Pulse: 87  Resp: 20  Temp: (!) 97.2 F (36.2 C)  SpO2: 100%   Filed Weights   12/24/19 0917  Weight: 145 lb 4.8 oz (65.9 kg)    Physical Exam Constitutional:      General: He is not in acute distress. HENT:     Head: Normocephalic and atraumatic.  Eyes:     General: No scleral icterus. Cardiovascular:     Rate and Rhythm: Normal rate and regular rhythm.      Heart sounds: Normal heart sounds.  Pulmonary:     Effort: Pulmonary effort is normal. No respiratory distress.     Breath sounds: No wheezing.  Abdominal:     General: Bowel sounds are normal. There is no distension.     Palpations: Abdomen is soft.  Musculoskeletal:  General: No deformity. Normal range of motion.     Cervical back: Normal range of motion and neck supple.  Skin:    General: Skin is warm and dry.     Findings: No erythema or rash.  Neurological:     General: No focal deficit present.     Mental Status: He is alert and oriented to person, place, and time. Mental status is at baseline.     Cranial Nerves: No cranial nerve deficit.     Coordination: Coordination normal.  Psychiatric:        Mood and Affect: Mood normal.      LABORATORY DATA:  I have reviewed the data as listed Lab Results  Component Value Date   WBC 7.8 12/10/2019   HGB 15.1 12/10/2019   HCT 42.6 12/10/2019   MCV 103.6 (H) 12/10/2019   PLT 153 12/10/2019   Recent Labs    11/10/19 0824 11/24/19 0810 12/10/19 0844  NA 136 137 142  K 3.7 3.3* 3.4*  CL 101 103 104  CO2 _0 GLUCOSE 118* 137* 105*  BUN _1 CREATININE 1.00 0.94 0.78  CALCIUM 9.0 9.1 9.1  GFRNONAA >60 >60 >60  GFRAA >60 >60 >60  PROT 7.1 7.3 7.5  ALBUMIN 3.6 3.6 3.7  AST _2 ALT _3 ALKPHOS 81 92 89  BILITOT 0.7 0.9 0.6   Iron/TIBC/Ferritin/ %Sat No results found for: IRON, TIBC, FERRITIN, IRONPCTSAT   Pathology 12/26/2018 Right hilar adenopathy/mass-EUS guided FNA Positive for malignancy, non small cell carcinoma, morphologically similar to previous right upper lobe squamous cell carcinoma.   ASSESSMENT & PLAN:  1. Squamous cell carcinoma of lung, right ([place])   2. Hypomagnesemia   3. Port-A-Cath in place   4. Encounter for antineoplastic immunotherapy   5. Hypokalemia    #Recurrent Squamous cell carcinoma of lung,  Currently on maintenance immunotherapy with durvalumab every 2  weeks. He tolerates well.  Labs are reviewed and discussed with patient. Proceed with durvalumab today.  Next CT surveillance August 2021.   #Chronic hypomagnesium.  Continue Slow Mag BID.  Magnesium level is 1.5 today.  2g IV magnesium sulfate today. #Chronic hypokalemia, potassium 3.4 today.  Continue potassium 20 mEq twice daily. . Return of visit: 2 weeks.    Earlie Server, MD, PhD Hematology Oncology [facility] 12/24/2019

## 2020-01-07 ENCOUNTER — Other Ambulatory Visit: Payer: Self-pay

## 2020-01-07 ENCOUNTER — Encounter: Payer: Self-pay | Admitting: Oncology

## 2020-01-07 ENCOUNTER — Inpatient Hospital Stay (HOSPITAL_BASED_OUTPATIENT_CLINIC_OR_DEPARTMENT_OTHER): Payer: Managed Care, Other (non HMO) | Admitting: Oncology

## 2020-01-07 ENCOUNTER — Inpatient Hospital Stay: Payer: Managed Care, Other (non HMO)

## 2020-01-07 VITALS — BP 137/92 | HR 108 | Temp 96.5°F | Resp 16 | Wt 143.0 lb

## 2020-01-07 DIAGNOSIS — C3491 Malignant neoplasm of unspecified part of right bronchus or lung: Secondary | ICD-10-CM

## 2020-01-07 DIAGNOSIS — E860 Dehydration: Secondary | ICD-10-CM

## 2020-01-07 DIAGNOSIS — Z95828 Presence of other vascular implants and grafts: Secondary | ICD-10-CM

## 2020-01-07 DIAGNOSIS — Z5112 Encounter for antineoplastic immunotherapy: Secondary | ICD-10-CM

## 2020-01-07 DIAGNOSIS — D696 Thrombocytopenia, unspecified: Secondary | ICD-10-CM

## 2020-01-07 LAB — CBC WITH DIFFERENTIAL/PLATELET
Abs Immature Granulocytes: 0.03 10*3/uL (ref 0.00–0.07)
Basophils Absolute: 0.1 10*3/uL (ref 0.0–0.1)
Basophils Relative: 1 %
Eosinophils Absolute: 0.2 10*3/uL (ref 0.0–0.5)
Eosinophils Relative: 2 %
HCT: 47 % (ref 39.0–52.0)
Hemoglobin: 16.7 g/dL (ref 13.0–17.0)
Immature Granulocytes: 0 %
Lymphocytes Relative: 13 %
Lymphs Abs: 1.2 10*3/uL (ref 0.7–4.0)
MCH: 36.9 pg — ABNORMAL HIGH (ref 26.0–34.0)
MCHC: 35.5 g/dL (ref 30.0–36.0)
MCV: 104 fL — ABNORMAL HIGH (ref 80.0–100.0)
Monocytes Absolute: 0.8 10*3/uL (ref 0.1–1.0)
Monocytes Relative: 8 %
Neutro Abs: 7.1 10*3/uL (ref 1.7–7.7)
Neutrophils Relative %: 76 %
Platelets: 144 10*3/uL — ABNORMAL LOW (ref 150–400)
RBC: 4.52 MIL/uL (ref 4.22–5.81)
RDW: 12.9 % (ref 11.5–15.5)
WBC: 9.4 10*3/uL (ref 4.0–10.5)
nRBC: 0 % (ref 0.0–0.2)

## 2020-01-07 LAB — COMPREHENSIVE METABOLIC PANEL
ALT: 31 U/L (ref 0–44)
AST: 46 U/L — ABNORMAL HIGH (ref 15–41)
Albumin: 3.8 g/dL (ref 3.5–5.0)
Alkaline Phosphatase: 97 U/L (ref 38–126)
Anion gap: 15 (ref 5–15)
BUN: 9 mg/dL (ref 6–20)
CO2: 22 mmol/L (ref 22–32)
Calcium: 9.1 mg/dL (ref 8.9–10.3)
Chloride: 104 mmol/L (ref 98–111)
Creatinine, Ser: 1.09 mg/dL (ref 0.61–1.24)
GFR calc Af Amer: >60 mL/min (ref >60)
GFR calc non Af Amer: >60 mL/min (ref >60)
Glucose, Bld: 89 mg/dL (ref 70–99)
Potassium: 4.4 mmol/L (ref 3.5–5.1)
Sodium: 141 mmol/L (ref 135–145)
Total Bilirubin: 0.7 mg/dL (ref 0.3–1.2)
Total Protein: 7.9 g/dL (ref 6.5–8.1)

## 2020-01-07 LAB — MAGNESIUM: Magnesium: 1.6 mg/dL — ABNORMAL LOW (ref 1.7–2.4)

## 2020-01-07 LAB — TSH: TSH: 0.835 u[IU]/mL (ref 0.350–4.500)

## 2020-01-07 MED ORDER — SODIUM CHLORIDE 0.9 % IV SOLN
Freq: Once | INTRAVENOUS | Status: AC
  Filled 2020-01-07: qty 250

## 2020-01-07 MED ORDER — SODIUM CHLORIDE 0.9% FLUSH
10.0000 mL | Freq: Once | INTRAVENOUS | Status: AC
  Administered 2020-01-07: 10 mL via INTRAVENOUS
  Filled 2020-01-07: qty 10

## 2020-01-07 MED ORDER — HEPARIN SOD (PORK) LOCK FLUSH 100 UNIT/ML IV SOLN
INTRAVENOUS | Status: AC
  Filled 2020-01-07: qty 5

## 2020-01-07 MED ORDER — SODIUM CHLORIDE 0.9 % IV SOLN
10.0000 mg/kg | Freq: Once | INTRAVENOUS | Status: AC
  Administered 2020-01-07: 620 mg via INTRAVENOUS
  Filled 2020-01-07: qty 10

## 2020-01-07 MED ORDER — MAGNESIUM SULFATE 2 GM/50ML IV SOLN
2.0000 g | Freq: Once | INTRAVENOUS | Status: AC
  Administered 2020-01-07: 2 g via INTRAVENOUS
  Filled 2020-01-07: qty 50

## 2020-01-07 MED ORDER — HEPARIN SOD (PORK) LOCK FLUSH 100 UNIT/ML IV SOLN
500.0000 [IU] | Freq: Once | INTRAVENOUS | Status: AC | PRN
  Administered 2020-01-07: 500 [IU]
  Filled 2020-01-07: qty 5

## 2020-01-07 NOTE — Progress Notes (Signed)
Hematology/Oncology Follow up note [facility] Telephone:(336) [phone] Fax:(336) [phone]   Patient Care Team: Tracie Harrier, MD as PCP - General (Internal Medicine) Earlie Server, MD as Consulting Physician (Hematology and Oncology)  REASON FOR VISIT:  Follow up for treatment of squamous lung cancer.   HISTORY OF PRESENTING ILLNESS:  # Dec 2019 Stage I squamous lung cancer #s/p Bronchoscopy on 06/25/2018. subcarina EBUS FNA was non diagnostic, hypocellular specimen.  # 08/13/2018 CT guided right upper lobe biopsy pathology showed dense fibrosis and mixed inflammatory cells with prominent polytypic plasma cells component. Focal benign bronchial wall and alveolar spaces. No malignancy was identified.   # 08/13/2018 underwent right thoracotomy and wedge resection of a right upper lobe mass.  Frozen section was consistent with an inflammatory process.  On the second postop day, preliminary pathology reports high-grade malignancy.  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  08/20/2018 Patient was therefore taken back to the OR and underwent take complete lobectomy  pT1b pN0 cM0 stage I squamous cell lung cancer.  Margin is negative.  Recommend observation.    # 12/03/2018 CT chest w contrast showed local recurrence.  12/10/2018 PET showed No evidence of distant metastatic disease # 12/26/2018 s/p bronchoscopy biopsy. Confirmed local recurrence of squamous cell lung cancer.  medi port placed by Dr.Oaks.  Cancer treatment Started concurrent chemoradiation on 01/16/2019 Carboplatin AUC of 2 and Taxol 45 mg/m2 weekly finished in 03/05/2019.   INTERVAL HISTORY Samuel Choi is a 54 y.o. male who has above history reviewed by me today presents for evaluation prior to immunotherapy for treatment of  recurrent  squamous cell lung cancer. Patient reports no new complaints.  Denies any nausea, vomiting, diarrhea.  Appetite is fair. He takes potassium and magnesium  supplementation In the clinic, patient is tachycardic with pulse of 108.  He denies any palpitation, chest discomfort or lightheaded  Review of Systems  Constitutional: Negative for appetite change, chills, fatigue, fever and unexpected weight change.  HENT:   Negative for hearing loss and voice change.   Eyes: Negative for eye problems and icterus.  Respiratory: Negative for chest tightness, cough and shortness of breath.   Cardiovascular: Negative for chest pain and leg swelling.  Gastrointestinal: Negative for abdominal distention and abdominal pain.  Endocrine: Negative for hot flashes.  Genitourinary: Negative for difficulty urinating, dysuria and frequency.   Musculoskeletal: Negative for arthralgias.  Skin: Negative for itching and rash.  Neurological: Negative for light-headedness and numbness.  Hematological: Negative for adenopathy. Does not bruise/bleed easily.  Psychiatric/Behavioral: Negative for confusion.    MEDICAL HISTORY:  Past Medical History:  Diagnosis Date  . Alcohol abuse    usually drinks 2-3 drinks per day  . Atherosclerosis 06/2018  . Chronic sinusitis   . Dehydration 02/07/2019  . Emphysema of lung ([place]) 06/2018   patient unaware of this.  . Hip fracture ([place]) 05/2018   no surgery  . History of kidney stones 05/2018   per xray, bilateral nephrolitiasis  . Hypertension   . Squamous cell carcinoma of lung, right ([place]) 06/2018    SURGICAL HISTORY: Past Surgical History:  Procedure Laterality Date  . BRAIN SURGERY  10/2017   nasal/sinus endoscopy. mass benign  . ELECTROMAGNETIC NAVIGATION BROCHOSCOPY Right 06/25/2018   Procedure: ELECTROMAGNETIC NAVIGATION BRONCHOSCOPY;  Surgeon: [name], MD;  Location: [facility] ORS;  Service: Cardiopulmonary;  Laterality: Right;  . ENDOBRONCHIAL ULTRASOUND Right 06/25/2018   Procedure: ENDOBRONCHIAL ULTRASOUND;  Surgeon: [name], MD;  Location: [facility] OR[facility];  Service: Cardiopulmonary;  Laterality: Right;  .  ENDOBRONCHIAL ULTRASOUND Right 12/26/2018   Procedure: ENDOBRONCHIAL ULTRASOUND RIGHT;  Surgeon: [name], MD;  Location: [facility] OR[facility];  Service: Cardiopulmonary;  Laterality: Right;  . FLEXIBLE BRONCHOSCOPY N/A 08/20/2018   Procedure: FLEXIBLE BRONCHOSCOPY PREOP;  Surgeon: [name], MD;  Location: [facility] ORS;  Service: Thoracic;  Laterality: N/A;  . IR CV LINE INJECTION  04/04/2019  . NASAL SINUS SURGERY  10/2017   At [facility], frontal sinusotomy, ethmoidectomy, resection anterior cranial fossa neoplasm, turbinate resection  . PORTACATH PLACEMENT Left 01/15/2019   Procedure: INSERTION PORT-A-CATH;  Surgeon: [name], MD;  Location: [facility] OR[facility];  Service: General;  Laterality: Left;  . THORACOTOMY Right 08/13/2018   Procedure: PREOP BROCHOSCOPY WITH RIGHT THORACOTOMY AND RUL RESECTION;  Surgeon: [name], MD;  Location: [facility] OR[facility];  Service: General;  Laterality: Right;  . THORACOTOMY Right 08/20/2018   Procedure: THORACOTOMY MAJOR RIGHT UPPER LOBE LOBECTOMY;  Surgeon: [name], MD;  Location: [facility] OR[facility];  Service: Thoracic;  Laterality: Right;  . TOE SURGERY Left    pin in left toe    SOCIAL HISTORY: Social History   Socioeconomic History  . Marital status: Married    Spouse name: lisa  . Number of children: Not on file  . Years of education: Not on file  . Highest education level: Not on file  Occupational History  . Occupation: welding    Comment: taking time off to resolve issues  Tobacco Use  . Smoking status: Former Smoker    Packs/day: 0.50    Years: 15.00    Pack years: 7.50    Types: Cigarettes    Quit date: 08/2018    Years since quitting: 1.4  . Smokeless tobacco: Never Used  Vaping Use  . Vaping Use: Never used  Substance and Sexual Activity  . Alcohol use: Yes    Alcohol/week: 3.0 standard drinks    Types: 3 Cans of beer per week    Comment: usually 2 drinks per week, per patient  . Drug use: No  . Sexual activity: Not on file  Other Topics Concern  . Not on  file  Social History Narrative  . Not on file   Social Determinants of Health   Financial Resource Strain:   . Difficulty of Paying Living Expenses:   Food Insecurity:   . Worried About Charity fundraiser in the Last Year:   . Arboriculturist in the Last Year:   Transportation Needs:   . Film/video editor (Medical):   Marland Kitchen Lack of Transportation (Non-Medical):   Physical Activity:   . Days of Exercise per Week:   . Minutes of Exercise per Session:   Stress:   . Feeling of Stress :   Social Connections:   . Frequency of Communication with Friends and Family:   . Frequency of Social Gatherings with Friends and Family:   . Attends Religious Services:   . Active Member of Clubs or Organizations:   . Attends Archivist Meetings:   Marland Kitchen Marital Status:   Intimate Partner Violence:   . Fear of Current or Ex-Partner:   . Emotionally Abused:   Marland Kitchen Physically Abused:   . Sexually Abused:     FAMILY HISTORY: Family History  Problem Relation Age of Onset  . Breast cancer Mother   . Diabetes Mother   . Lung cancer Father   . Hypertension Father     ALLERGIES:  has No Known Allergies.  MEDICATIONS:  Current Outpatient Medications  Medication Sig Dispense Refill  . albuterol (VENTOLIN HFA) 108 (90 Base) MCG/ACT inhaler Inhale 2 puffs into the lungs every 6 (six) hours as needed for wheezing or shortness of breath. 8 g 6  . cloNIDine (CATAPRES) 0.1 MG tablet Take 0.1 mg by mouth daily.   5  . diltiazem (CARDIZEM CD) 120 MG 24 hr capsule Take 120 mg by mouth daily.    . folic acid (V-R FOLIC ACID) 657 MCG tablet Take 1 tablet (400 mcg total) by mouth daily. 90 tablet 1  . hydrALAZINE (APRESOLINE) 100 MG tablet Take 50 mg by mouth 2 (two) times daily.     . hydrochlorothiazide (HYDRODIURIL) 12.5 MG tablet Take 12.5 mg by mouth daily.     Marland Kitchen KLOR-CON M20 20 MEQ tablet TAKE 1 TABLET BY MOUTH TWICE A DAY 60 tablet 0  . latanoprost (XALATAN) 0.005 % ophthalmic solution 1 drop  at bedtime.    . lidocaine-prilocaine (EMLA) cream Apply to affected area once 30 g 3  . magnesium chloride (SLOW-MAG) 64 MG TBEC SR tablet Take 1 tablet (64 mg total) by mouth 2 (two) times daily. TAKE 1 TABLET (64 MG TOTAL) BY MOUTH DAILY. 60 tablet 1  . methocarbamol (ROBAXIN) 500 MG tablet Take 1 tablet (500 mg total) by mouth at bedtime. 30 tablet 0  . olmesartan (BENICAR) 40 MG tablet Take 40 mg by mouth every other day.     . ondansetron (ZOFRAN) 8 MG tablet Take 1 tablet (8 mg total) by mouth 2 (two) times daily as needed for refractory nausea / vomiting. Start on day 3 after chemo. 30 tablet 1  . oxyCODONE-acetaminophen (PERCOCET) 5-325 MG tablet Take 1-2 tablets by mouth every 4 (four) hours as needed for severe pain. 20 tablet 0  . prochlorperazine (COMPAZINE) 10 MG tablet Take 1 tablet (10 mg total) by mouth every 6 (six) hours as needed (Nausea or vomiting). 30 tablet 1  . vitamin B-12 (CYANOCOBALAMIN) 1000 MCG tablet Take 1 tablet (1,000 mcg total) by mouth daily. 90 tablet 1   No current facility-administered medications for this visit.   Facility-Administered Medications Ordered in Other Visits  Medication Dose Route Frequency Provider Last Rate Last Admin  . albuterol (PROVENTIL) (2.5 MG/3ML) 0.083% nebulizer solution 2.5 mg  2.5 mg Nebulization Once System, Provider Not In      . heparin lock flush 100 unit/mL  500 Units Intravenous Once Earlie Server, MD      . sodium chloride flush (NS) 0.9 % injection 10 mL  10 mL Intravenous PRN Earlie Server, MD   10 mL at 04/04/19 0903     PHYSICAL EXAMINATION: ECOG PERFORMANCE STATUS: 0 - Asymptomatic Vitals:   01/07/20 0941  BP: (!) 137/92  Pulse: (!) 108  Resp: 16  Temp: (!) 96.5 F (35.8 C)   Filed Weights   01/07/20 0941  Weight: 143 lb (64.9 kg)    Physical Exam Constitutional:      General: He is not in acute distress. HENT:     Head: Normocephalic and atraumatic.  Eyes:     General: No scleral icterus. Cardiovascular:       Rate and Rhythm: Normal rate and regular rhythm.     Heart sounds: Normal heart sounds.  Pulmonary:     Effort: Pulmonary effort is normal. No respiratory distress.     Breath sounds: No wheezing.  Abdominal:     General: Bowel sounds are normal. There is no distension.  Palpations: Abdomen is soft.  Musculoskeletal:        General: No deformity. Normal range of motion.     Cervical back: Normal range of motion and neck supple.  Skin:    General: Skin is warm and dry.     Findings: No erythema or rash.  Neurological:     General: No focal deficit present.     Mental Status: He is alert and oriented to person, place, and time. Mental status is at baseline.     Cranial Nerves: No cranial nerve deficit.     Coordination: Coordination normal.  Psychiatric:        Mood and Affect: Mood normal.      LABORATORY DATA:  I have reviewed the data as listed Lab Results  Component Value Date   WBC 9.4 01/07/2020   HGB 16.7 01/07/2020   HCT 47.0 01/07/2020   MCV 104.0 (H) 01/07/2020   PLT 144 (L) 01/07/2020   Recent Labs    12/10/19 0844 12/24/19 0842 01/07/20 0925  NA 142 141 141  K 3.4* 3.4* 4.4  CL 104 102 104  CO2 _0 GLUCOSE 105* 124* 89  BUN _1 CREATININE 0.78 1.01 1.09  CALCIUM 9.1 9.0 9.1  GFRNONAA >60 >60 >60  GFRAA >60 >60 >60  PROT 7.5 7.4 7.9  ALBUMIN 3.7 3.6 3.8  AST 27 42* 46*  ALT _2 ALKPHOS 89 82 97  BILITOT 0.6 0.6 0.7   Iron/TIBC/Ferritin/ %Sat No results found for: IRON, TIBC, FERRITIN, IRONPCTSAT   Pathology 12/26/2018 Right hilar adenopathy/mass-EUS guided FNA Positive for malignancy, non small cell carcinoma, morphologically similar to previous right upper lobe squamous cell carcinoma.   ASSESSMENT & PLAN:  1. Squamous cell carcinoma of lung, right ([place])   2. Hypomagnesemia   3. Encounter for antineoplastic immunotherapy    #Recurrent Squamous cell carcinoma of lung,  Currently on maintenance immunotherapy with  durvalumab every 2 weeks. Overall he tolerates well.  Labs are reviewed and discussed with patient Proceed with durvalumab today. Neck CT surveillance being August 2021.  #Tachycardia, patient has recently been seen by cardiologist.  Patient is on diltiazem.  And he is asymptomatic.  Monitor.  Continue follow-up with cardiology.  #Chronic hypomagnesium.  Continue Slow Mag BID.  Magnesium level is 1.6 today.  Proceed with 2 g IV magnesium sulfate today. #Chronic hypokalemia, potassium 4.4 today.  Continue potassium supplementation. . Return of visit: 2 weeks.    Earlie Server, MD, PhD Hematology Oncology [facility] 01/07/2020

## 2020-01-07 NOTE — Progress Notes (Signed)
Per Dr Tasia Catchings, ok to proceed with treatment with HR of 108

## 2020-01-07 NOTE — Progress Notes (Signed)
Patient denies new problems/concerns today.   °

## 2020-01-07 NOTE — Progress Notes (Signed)
HR 108, ok to treat per MD

## 2020-01-20 ENCOUNTER — Other Ambulatory Visit: Payer: Self-pay

## 2020-01-20 ENCOUNTER — Encounter: Payer: Self-pay | Admitting: Oncology

## 2020-01-20 DIAGNOSIS — C3491 Malignant neoplasm of unspecified part of right bronchus or lung: Secondary | ICD-10-CM

## 2020-01-20 NOTE — Progress Notes (Signed)
Patient prescreened for appointment. Patient has no concerns or questions.  

## 2020-01-21 ENCOUNTER — Inpatient Hospital Stay: Payer: Managed Care, Other (non HMO) | Attending: Oncology

## 2020-01-21 ENCOUNTER — Inpatient Hospital Stay (HOSPITAL_BASED_OUTPATIENT_CLINIC_OR_DEPARTMENT_OTHER): Payer: Managed Care, Other (non HMO) | Admitting: Oncology

## 2020-01-21 ENCOUNTER — Inpatient Hospital Stay: Payer: Managed Care, Other (non HMO)

## 2020-01-21 ENCOUNTER — Other Ambulatory Visit: Payer: Self-pay

## 2020-01-21 VITALS — BP 137/101 | HR 82 | Temp 97.0°F | Resp 18 | Wt 145.5 lb

## 2020-01-21 DIAGNOSIS — Z923 Personal history of irradiation: Secondary | ICD-10-CM | POA: Insufficient documentation

## 2020-01-21 DIAGNOSIS — Z79899 Other long term (current) drug therapy: Secondary | ICD-10-CM | POA: Insufficient documentation

## 2020-01-21 DIAGNOSIS — C3491 Malignant neoplasm of unspecified part of right bronchus or lung: Secondary | ICD-10-CM

## 2020-01-21 DIAGNOSIS — C3411 Malignant neoplasm of upper lobe, right bronchus or lung: Secondary | ICD-10-CM | POA: Insufficient documentation

## 2020-01-21 DIAGNOSIS — E876 Hypokalemia: Secondary | ICD-10-CM | POA: Diagnosis not present

## 2020-01-21 DIAGNOSIS — J439 Emphysema, unspecified: Secondary | ICD-10-CM | POA: Diagnosis not present

## 2020-01-21 DIAGNOSIS — Z833 Family history of diabetes mellitus: Secondary | ICD-10-CM | POA: Diagnosis not present

## 2020-01-21 DIAGNOSIS — Z87891 Personal history of nicotine dependence: Secondary | ICD-10-CM | POA: Diagnosis not present

## 2020-01-21 DIAGNOSIS — Z5112 Encounter for antineoplastic immunotherapy: Secondary | ICD-10-CM | POA: Insufficient documentation

## 2020-01-21 DIAGNOSIS — R000 Tachycardia, unspecified: Secondary | ICD-10-CM | POA: Insufficient documentation

## 2020-01-21 DIAGNOSIS — I10 Essential (primary) hypertension: Secondary | ICD-10-CM | POA: Diagnosis not present

## 2020-01-21 DIAGNOSIS — Z9221 Personal history of antineoplastic chemotherapy: Secondary | ICD-10-CM | POA: Diagnosis not present

## 2020-01-21 DIAGNOSIS — Z803 Family history of malignant neoplasm of breast: Secondary | ICD-10-CM | POA: Insufficient documentation

## 2020-01-21 DIAGNOSIS — Z801 Family history of malignant neoplasm of trachea, bronchus and lung: Secondary | ICD-10-CM | POA: Diagnosis not present

## 2020-01-21 DIAGNOSIS — Z8249 Family history of ischemic heart disease and other diseases of the circulatory system: Secondary | ICD-10-CM | POA: Diagnosis not present

## 2020-01-21 DIAGNOSIS — E860 Dehydration: Secondary | ICD-10-CM

## 2020-01-21 LAB — COMPREHENSIVE METABOLIC PANEL
ALT: 31 U/L (ref 0–44)
AST: 33 U/L (ref 15–41)
Albumin: 3.4 g/dL — ABNORMAL LOW (ref 3.5–5.0)
Alkaline Phosphatase: 84 U/L (ref 38–126)
Anion gap: 10 (ref 5–15)
BUN: 10 mg/dL (ref 6–20)
CO2: 23 mmol/L (ref 22–32)
Calcium: 9 mg/dL (ref 8.9–10.3)
Chloride: 104 mmol/L (ref 98–111)
Creatinine, Ser: 0.95 mg/dL (ref 0.61–1.24)
GFR calc Af Amer: >60 mL/min (ref >60)
GFR calc non Af Amer: >60 mL/min (ref >60)
Glucose, Bld: 137 mg/dL — ABNORMAL HIGH (ref 70–99)
Potassium: 3.7 mmol/L (ref 3.5–5.1)
Sodium: 137 mmol/L (ref 135–145)
Total Bilirubin: 0.5 mg/dL (ref 0.3–1.2)
Total Protein: 7.3 g/dL (ref 6.5–8.1)

## 2020-01-21 LAB — CBC WITH DIFFERENTIAL/PLATELET
Abs Immature Granulocytes: 0.02 10*3/uL (ref 0.00–0.07)
Basophils Absolute: 0.1 10*3/uL (ref 0.0–0.1)
Basophils Relative: 1 %
Eosinophils Absolute: 0.3 10*3/uL (ref 0.0–0.5)
Eosinophils Relative: 3 %
HCT: 41.8 % (ref 39.0–52.0)
Hemoglobin: 14.9 g/dL (ref 13.0–17.0)
Immature Granulocytes: 0 %
Lymphocytes Relative: 8 %
Lymphs Abs: 0.8 10*3/uL (ref 0.7–4.0)
MCH: 36.8 pg — ABNORMAL HIGH (ref 26.0–34.0)
MCHC: 35.6 g/dL (ref 30.0–36.0)
MCV: 103.2 fL — ABNORMAL HIGH (ref 80.0–100.0)
Monocytes Absolute: 0.9 10*3/uL (ref 0.1–1.0)
Monocytes Relative: 9 %
Neutro Abs: 7.9 10*3/uL — ABNORMAL HIGH (ref 1.7–7.7)
Neutrophils Relative %: 79 %
Platelets: 155 10*3/uL (ref 150–400)
RBC: 4.05 MIL/uL — ABNORMAL LOW (ref 4.22–5.81)
RDW: 11.9 % (ref 11.5–15.5)
WBC: 9.9 10*3/uL (ref 4.0–10.5)
nRBC: 0 % (ref 0.0–0.2)

## 2020-01-21 LAB — MAGNESIUM: Magnesium: 1.6 mg/dL — ABNORMAL LOW (ref 1.7–2.4)

## 2020-01-21 MED ORDER — SODIUM CHLORIDE 0.9 % IV SOLN
Freq: Once | INTRAVENOUS | Status: AC
  Filled 2020-01-21: qty 250

## 2020-01-21 MED ORDER — SODIUM CHLORIDE 0.9% FLUSH
10.0000 mL | INTRAVENOUS | Status: DC | PRN
  Administered 2020-01-21: 10 mL via INTRAVENOUS
  Filled 2020-01-21: qty 10

## 2020-01-21 MED ORDER — HEPARIN SOD (PORK) LOCK FLUSH 100 UNIT/ML IV SOLN
500.0000 [IU] | Freq: Once | INTRAVENOUS | Status: AC
  Administered 2020-01-21: 500 [IU] via INTRAVENOUS
  Filled 2020-01-21: qty 5

## 2020-01-21 MED ORDER — SODIUM CHLORIDE 0.9 % IV SOLN
10.0000 mg/kg | Freq: Once | INTRAVENOUS | Status: AC
  Administered 2020-01-21: 620 mg via INTRAVENOUS
  Filled 2020-01-21: qty 10

## 2020-01-21 MED ORDER — HEPARIN SOD (PORK) LOCK FLUSH 100 UNIT/ML IV SOLN
INTRAVENOUS | Status: AC
  Filled 2020-01-21: qty 5

## 2020-01-21 MED ORDER — HEPARIN SOD (PORK) LOCK FLUSH 100 UNIT/ML IV SOLN
500.0000 [IU] | Freq: Once | INTRAVENOUS | Status: DC | PRN
  Filled 2020-01-21: qty 5

## 2020-01-21 MED ORDER — MAGNESIUM SULFATE 2 GM/50ML IV SOLN
2.0000 g | Freq: Once | INTRAVENOUS | Status: AC
  Administered 2020-01-21: 2 g via INTRAVENOUS
  Filled 2020-01-21: qty 50

## 2020-01-21 NOTE — Progress Notes (Signed)
Hematology/Oncology Follow up note [facility] [phone] Fax:([phone]   Patient Care Team: Tracie Harrier, MD as PCP - General (Internal Medicine) Earlie Server, MD as Consulting Physician (Hematology and Oncology)  REASON FOR VISIT:  Follow up for treatment of squamous lung cancer.   HISTORY OF PRESENTING ILLNESS:  # Dec 2019 Stage I squamous lung cancer #s/p Bronchoscopy on 06/25/2018. subcarina EBUS FNA was non diagnostic, hypocellular specimen.  # 08/13/2018 CT guided right upper lobe biopsy pathology showed dense fibrosis and mixed inflammatory cells with prominent polytypic plasma cells component. Focal benign bronchial wall and alveolar spaces. No malignancy was identified.   # 08/13/2018 underwent right thoracotomy and wedge resection of a right upper lobe mass.  Frozen section was consistent with an inflammatory process.  On the second postop day, preliminary pathology reports high-grade malignancy.  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  08/20/2018 Patient was therefore taken back to the OR and underwent take complete lobectomy  pT1b pN0 cM0 stage I squamous cell lung cancer.  Margin is negative.  Recommend observation.    # 12/03/2018 CT chest w contrast showed local recurrence.  12/10/2018 PET showed No evidence of distant metastatic disease # 12/26/2018 s/p bronchoscopy biopsy. Confirmed local recurrence of squamous cell lung cancer.  medi port placed by Dr.Oaks.  Cancer treatment Started concurrent chemoradiation on 01/16/2019 Carboplatin AUC of 2 and Taxol 45 mg/m2 weekly finished in 03/05/2019.   INTERVAL HISTORY REMER COUSE is a 54 y.o. male who has above history reviewed by me today presents for evaluation prior to immunotherapy for treatment of  recurrent  squamous cell lung cancer. Patient has no new complaints.  Denies any nausea,, appetite is fair. Patient takes potassium and magnesium supplementation. Tachycardia,  patient has seen cardiology.  On diltiazem.  Tachycardia has resolved.  Review of Systems  Constitutional: Negative for appetite change, chills, fatigue, fever and unexpected weight change.  HENT:   Negative for hearing loss and voice change.   Eyes: Negative for eye problems and icterus.  Respiratory: Negative for chest tightness, cough and shortness of breath.   Cardiovascular: Negative for chest pain and leg swelling.  Gastrointestinal: Negative for abdominal distention and abdominal pain.  Endocrine: Negative for hot flashes.  Genitourinary: Negative for difficulty urinating, dysuria and frequency.   Musculoskeletal: Negative for arthralgias.  Skin: Negative for itching and rash.  Neurological: Negative for light-headedness and numbness.  Hematological: Negative for adenopathy. Does not bruise/bleed easily.  Psychiatric/Behavioral: Negative for confusion.    MEDICAL HISTORY:  Past Medical History:  Diagnosis Date  . Alcohol abuse    usually drinks 2-3 drinks per day  . Atherosclerosis 06/2018  . Chronic sinusitis   . Dehydration 02/07/2019  . Emphysema of lung ([place]) 06/2018   patient unaware of this.  . Hip fracture ([place]) 05/2018   no surgery  . History of kidney stones 05/2018   per xray, bilateral nephrolitiasis  . Hypertension   . Squamous cell carcinoma of lung, right ([place]) 06/2018    SURGICAL HISTORY: Past Surgical History:  Procedure Laterality Date  . BRAIN SURGERY  10/2017   nasal/sinus endoscopy. mass benign  . ELECTROMAGNETIC NAVIGATION BROCHOSCOPY Right 06/25/2018   Procedure: ELECTROMAGNETIC NAVIGATION BRONCHOSCOPY;  Surgeon: [name], MD;  Location: [facility] OR[facility];  Service: Cardiopulmonary;  Laterality: Right;  . ENDOBRONCHIAL ULTRASOUND Right 06/25/2018   Procedure: ENDOBRONCHIAL ULTRASOUND;  Surgeon: [name], MD;  Location: [facility];  Service: Cardiopulmonary;  Laterality: Right;  . ENDOBRONCHIAL ULTRASOUND Right  12/26/2018   Procedure: ENDOBRONCHIAL  ULTRASOUND RIGHT;  Surgeon: [name], MD;  Location: [facility];  Service: Cardiopulmonary;  Laterality: Right;  . FLEXIBLE BRONCHOSCOPY N/A 08/20/2018   Procedure: FLEXIBLE BRONCHOSCOPY PREOP;  Surgeon: [name], MD;  Location: [facility] OR[facility];  Service: Thoracic;  Laterality: N/A;  . IR CV LINE INJECTION  04/04/2019  . NASAL SINUS SURGERY  10/2017   At [facility], frontal sinusotomy, ethmoidectomy, resection anterior cranial fossa neoplasm, turbinate resection  . PORTACATH PLACEMENT Left 01/15/2019   Procedure: INSERTION PORT-A-CATH;  Surgeon: [name], MD;  Location: [facility] OR[facility];  Service: General;  Laterality: Left;  . THORACOTOMY Right 08/13/2018   Procedure: PREOP BROCHOSCOPY WITH RIGHT THORACOTOMY AND RUL RESECTION;  Surgeon: [name], MD;  Location: [facility];  Service: General;  Laterality: Right;  . THORACOTOMY Right 08/20/2018   Procedure: THORACOTOMY MAJOR RIGHT UPPER LOBE LOBECTOMY;  Surgeon: [name], MD;  Location: [facility];  Service: Thoracic;  Laterality: Right;  . TOE SURGERY Left    pin in left toe    SOCIAL HISTORY: Social History   Socioeconomic History  . Marital status: Married    Spouse name: lisa  . Number of children: Not on file  . Years of education: Not on file  . Highest education level: Not on file  Occupational History  . Occupation: welding    Comment: taking time off to resolve issues  Tobacco Use  . Smoking status: Former Smoker    Packs/day: 0.50    Years: 15.00    Pack years: 7.50    Types: Cigarettes    Quit date: 08/2018    Years since quitting: 1.4  . Smokeless tobacco: Never Used  Vaping Use  . Vaping Use: Never used  Substance and Sexual Activity  . Alcohol use: Yes    Alcohol/week: 3.0 standard drinks    Types: 3 Cans of beer per week    Comment: usually 2 drinks per week, per patient  . Drug use: No  . Sexual activity: Not on file  Other Topics Concern  . Not on file  Social History Narrative  . Not on file   Social  Determinants of Health   Financial Resource Strain:   . Difficulty of Paying Living Expenses:   Food Insecurity:   . Worried About Charity fundraiser in the Last Year:   . Arboriculturist in the Last Year:   Transportation Needs:   . Film/video editor (Medical):   Marland Kitchen Lack of Transportation (Non-Medical):   Physical Activity:   . Days of Exercise per Week:   . Minutes of Exercise per Session:   Stress:   . Feeling of Stress :   Social Connections:   . Frequency of Communication with Friends and Family:   . Frequency of Social Gatherings with Friends and Family:   . Attends Religious Services:   . Active Member of Clubs or Organizations:   . Attends Archivist Meetings:   Marland Kitchen Marital Status:   Intimate Partner Violence:   . Fear of Current or Ex-Partner:   . Emotionally Abused:   Marland Kitchen Physically Abused:   . Sexually Abused:     FAMILY HISTORY: Family History  Problem Relation Age of Onset  . Breast cancer Mother   . Diabetes Mother   . Lung cancer Father   . Hypertension Father     ALLERGIES:  has No Known Allergies.  MEDICATIONS:  Current Outpatient Medications  Medication Sig Dispense Refill  .  albuterol (VENTOLIN HFA) 108 (90 Base) MCG/ACT inhaler Inhale 2 puffs into the lungs every 6 (six) hours as needed for wheezing or shortness of breath. 8 g 6  . cloNIDine (CATAPRES) 0.1 MG tablet Take 0.1 mg by mouth daily.   5  . diltiazem (CARDIZEM CD) 120 MG 24 hr capsule Take 120 mg by mouth daily.    . folic acid (V-R FOLIC ACID) 062 MCG tablet Take 1 tablet (400 mcg total) by mouth daily. 90 tablet 1  . hydrALAZINE (APRESOLINE) 100 MG tablet Take 50 mg by mouth 2 (two) times daily.     . hydrochlorothiazide (HYDRODIURIL) 12.5 MG tablet Take 12.5 mg by mouth daily.     Marland Kitchen KLOR-CON M20 20 MEQ tablet TAKE 1 TABLET BY MOUTH TWICE A DAY 60 tablet 0  . latanoprost (XALATAN) 0.005 % ophthalmic solution 1 drop at bedtime.    . lidocaine-prilocaine (EMLA) cream Apply to  affected area once 30 g 3  . magnesium chloride (SLOW-MAG) 64 MG TBEC SR tablet Take 1 tablet (64 mg total) by mouth 2 (two) times daily. TAKE 1 TABLET (64 MG TOTAL) BY MOUTH DAILY. 60 tablet 1  . methocarbamol (ROBAXIN) 500 MG tablet Take 1 tablet (500 mg total) by mouth at bedtime. 30 tablet 0  . olmesartan (BENICAR) 40 MG tablet Take 40 mg by mouth every other day.     . ondansetron (ZOFRAN) 8 MG tablet Take 1 tablet (8 mg total) by mouth 2 (two) times daily as needed for refractory nausea / vomiting. Start on day 3 after chemo. 30 tablet 1  . prochlorperazine (COMPAZINE) 10 MG tablet Take 1 tablet (10 mg total) by mouth every 6 (six) hours as needed (Nausea or vomiting). 30 tablet 1  . vitamin B-12 (CYANOCOBALAMIN) 1000 MCG tablet Take 1 tablet (1,000 mcg total) by mouth daily. 90 tablet 1   No current facility-administered medications for this visit.   Facility-Administered Medications Ordered in Other Visits  Medication Dose Route Frequency Provider Last Rate Last Admin  . albuterol (PROVENTIL) (2.5 MG/3ML) 0.083% nebulizer solution 2.5 mg  2.5 mg Nebulization Once System, Provider Not In      . heparin lock flush 100 unit/mL  500 Units Intravenous Once Earlie Server, MD      . heparin lock flush 100 unit/mL  500 Units Intravenous Once Earlie Server, MD      . sodium chloride flush (NS) 0.9 % injection 10 mL  10 mL Intravenous PRN Earlie Server, MD   10 mL at 04/04/19 0903  . sodium chloride flush (NS) 0.9 % injection 10 mL  10 mL Intravenous PRN Earlie Server, MD         PHYSICAL EXAMINATION: ECOG PERFORMANCE STATUS: 0 - Asymptomatic There were no vitals filed for this visit. There were no vitals filed for this visit.  Physical Exam Constitutional:      General: He is not in acute distress. HENT:     Head: Normocephalic and atraumatic.  Eyes:     General: No scleral icterus. Cardiovascular:     Rate and Rhythm: Normal rate and regular rhythm.     Heart sounds: Normal heart sounds.  Pulmonary:       Effort: Pulmonary effort is normal. No respiratory distress.     Breath sounds: No wheezing.  Abdominal:     General: Bowel sounds are normal. There is no distension.     Palpations: Abdomen is soft.  Musculoskeletal:        General:  No deformity. Normal range of motion.     Cervical back: Normal range of motion and neck supple.  Skin:    General: Skin is warm and dry.     Findings: No erythema or rash.  Neurological:     General: No focal deficit present.     Mental Status: He is alert and oriented to person, place, and time. Mental status is at baseline.     Cranial Nerves: No cranial nerve deficit.     Coordination: Coordination normal.  Psychiatric:        Mood and Affect: Mood normal.      LABORATORY DATA:  I have reviewed the data as listed Lab Results  Component Value Date   WBC 9.4 01/07/2020   HGB 16.7 01/07/2020   HCT 47.0 01/07/2020   MCV 104.0 (H) 01/07/2020   PLT 144 (L) 01/07/2020   Recent Labs    12/10/19 0844 12/24/19 0842 01/07/20 0925  NA 142 141 141  K 3.4* 3.4* 4.4  CL 104 102 104  CO2 _0 GLUCOSE 105* 124* 89  BUN _1 CREATININE 0.78 1.01 1.09  CALCIUM 9.1 9.0 9.1  GFRNONAA >60 >60 >60  GFRAA >60 >60 >60  PROT 7.5 7.4 7.9  ALBUMIN 3.7 3.6 3.8  AST 27 42* 46*  ALT _2 ALKPHOS 89 82 97  BILITOT 0.6 0.6 0.7   Iron/TIBC/Ferritin/ %Sat No results found for: IRON, TIBC, FERRITIN, IRONPCTSAT   Pathology 12/26/2018 Right hilar adenopathy/mass-EUS guided FNA Positive for malignancy, non small cell carcinoma, morphologically similar to previous right upper lobe squamous cell carcinoma.   ASSESSMENT & PLAN:  1. Squamous cell carcinoma of lung, right ([place])   2. Encounter for antineoplastic immunotherapy   3. Hypomagnesemia   4. Hypokalemia    #Recurrent Squamous cell carcinoma of lung,  Currently on maintenance immunotherapy with durvalumab every 2 weeks Labs are reviewed and discussed with patient proceed with  durvalumab.  Denies any pain Next CT surveillance will be obtained in August 2021.  #Tachycardia, he is asymptomatic.  Has been seen by cardiology.  On diltiazem. #Chronic hypomagnesemia. -Continue slow magnesium twice daily.  Magnesium level is 1.6, proceed with IV magnesium 2 g today.  #Chronic hypokalemia, potassium level is normal today.  Continue maintenance potassium chloride 20 mEq daily.  Return of visit: 2 weeks.    Earlie Server, MD, PhD Hematology Oncology [facility] 01/21/2020

## 2020-01-21 NOTE — Progress Notes (Signed)
Pt here for follow. No new concerns voiced.

## 2020-01-22 ENCOUNTER — Encounter: Payer: Self-pay | Admitting: *Deleted

## 2020-02-04 ENCOUNTER — Inpatient Hospital Stay (HOSPITAL_BASED_OUTPATIENT_CLINIC_OR_DEPARTMENT_OTHER): Payer: Managed Care, Other (non HMO) | Admitting: Oncology

## 2020-02-04 ENCOUNTER — Inpatient Hospital Stay: Payer: Managed Care, Other (non HMO)

## 2020-02-04 ENCOUNTER — Ambulatory Visit
Admission: RE | Admit: 2020-02-04 | Discharge: 2020-02-04 | Disposition: A | Discharge status: Home / Self Care | Payer: Managed Care, Other (non HMO) | Source: Home / Self Care | Attending: Oncology | Admitting: Oncology

## 2020-02-04 ENCOUNTER — Other Ambulatory Visit: Payer: Self-pay

## 2020-02-04 ENCOUNTER — Encounter: Payer: Self-pay | Admitting: Oncology

## 2020-02-04 ENCOUNTER — Ambulatory Visit
Admission: RE | Admit: 2020-02-04 | Discharge: 2020-02-04 | Disposition: A | Discharge status: Home / Self Care | Payer: Managed Care, Other (non HMO) | Source: Ambulatory Visit | Attending: Oncology | Admitting: Oncology

## 2020-02-04 VITALS — BP 162/99 | HR 84 | Temp 96.4°F | Resp 16 | Wt 147.1 lb

## 2020-02-04 DIAGNOSIS — R059 Cough, unspecified: Secondary | ICD-10-CM

## 2020-02-04 DIAGNOSIS — E860 Dehydration: Secondary | ICD-10-CM

## 2020-02-04 DIAGNOSIS — R05 Cough: Secondary | ICD-10-CM | POA: Insufficient documentation

## 2020-02-04 DIAGNOSIS — C3491 Malignant neoplasm of unspecified part of right bronchus or lung: Secondary | ICD-10-CM | POA: Diagnosis not present

## 2020-02-04 DIAGNOSIS — Z5112 Encounter for antineoplastic immunotherapy: Secondary | ICD-10-CM | POA: Diagnosis not present

## 2020-02-04 DIAGNOSIS — E876 Hypokalemia: Secondary | ICD-10-CM | POA: Diagnosis not present

## 2020-02-04 LAB — COMPREHENSIVE METABOLIC PANEL
ALT: 20 U/L (ref 0–44)
AST: 23 U/L (ref 15–41)
Albumin: 3.3 g/dL — ABNORMAL LOW (ref 3.5–5.0)
Alkaline Phosphatase: 86 U/L (ref 38–126)
Anion gap: 8 (ref 5–15)
BUN: 8 mg/dL (ref 6–20)
CO2: 25 mmol/L (ref 22–32)
Calcium: 8.8 mg/dL — ABNORMAL LOW (ref 8.9–10.3)
Chloride: 105 mmol/L (ref 98–111)
Creatinine, Ser: 1.01 mg/dL (ref 0.61–1.24)
GFR calc Af Amer: >60 mL/min (ref >60)
GFR calc non Af Amer: >60 mL/min (ref >60)
Glucose, Bld: 120 mg/dL — ABNORMAL HIGH (ref 70–99)
Potassium: 3.3 mmol/L — ABNORMAL LOW (ref 3.5–5.1)
Sodium: 138 mmol/L (ref 135–145)
Total Bilirubin: 0.8 mg/dL (ref 0.3–1.2)
Total Protein: 7.1 g/dL (ref 6.5–8.1)

## 2020-02-04 LAB — CBC WITH DIFFERENTIAL/PLATELET
Abs Immature Granulocytes: 0.02 10*3/uL (ref 0.00–0.07)
Basophils Absolute: 0.1 10*3/uL (ref 0.0–0.1)
Basophils Relative: 1 %
Eosinophils Absolute: 0.5 10*3/uL (ref 0.0–0.5)
Eosinophils Relative: 5 %
HCT: 42 % (ref 39.0–52.0)
Hemoglobin: 14.7 g/dL (ref 13.0–17.0)
Immature Granulocytes: 0 %
Lymphocytes Relative: 9 %
Lymphs Abs: 0.9 10*3/uL (ref 0.7–4.0)
MCH: 35.9 pg — ABNORMAL HIGH (ref 26.0–34.0)
MCHC: 35 g/dL (ref 30.0–36.0)
MCV: 102.7 fL — ABNORMAL HIGH (ref 80.0–100.0)
Monocytes Absolute: 0.7 10*3/uL (ref 0.1–1.0)
Monocytes Relative: 7 %
Neutro Abs: 7.9 10*3/uL — ABNORMAL HIGH (ref 1.7–7.7)
Neutrophils Relative %: 78 %
Platelets: 139 10*3/uL — ABNORMAL LOW (ref 150–400)
RBC: 4.09 MIL/uL — ABNORMAL LOW (ref 4.22–5.81)
RDW: 11.8 % (ref 11.5–15.5)
WBC: 10.1 10*3/uL (ref 4.0–10.5)
nRBC: 0 % (ref 0.0–0.2)

## 2020-02-04 LAB — MAGNESIUM: Magnesium: 1.5 mg/dL — ABNORMAL LOW (ref 1.7–2.4)

## 2020-02-04 MED ORDER — SODIUM CHLORIDE 0.9 % IV SOLN
Freq: Once | INTRAVENOUS | Status: AC
  Filled 2020-02-04: qty 250

## 2020-02-04 MED ORDER — HEPARIN SOD (PORK) LOCK FLUSH 100 UNIT/ML IV SOLN
500.0000 [IU] | Freq: Once | INTRAVENOUS | Status: AC | PRN
  Administered 2020-02-04: 500 [IU]
  Filled 2020-02-04: qty 5

## 2020-02-04 MED ORDER — SODIUM CHLORIDE 0.9% FLUSH
10.0000 mL | Freq: Once | INTRAVENOUS | Status: AC | PRN
  Administered 2020-02-04: 10 mL
  Filled 2020-02-04: qty 10

## 2020-02-04 MED ORDER — SODIUM CHLORIDE 0.9% FLUSH
10.0000 mL | Freq: Once | INTRAVENOUS | Status: AC
  Administered 2020-02-04: 10 mL via INTRAVENOUS
  Filled 2020-02-04: qty 10

## 2020-02-04 MED ORDER — SODIUM CHLORIDE 0.9 % IV SOLN
Freq: Every day | INTRAVENOUS | Status: DC | PRN
  Filled 2020-02-04 (×2): qty 10

## 2020-02-04 MED ORDER — HEPARIN SOD (PORK) LOCK FLUSH 100 UNIT/ML IV SOLN
INTRAVENOUS | Status: AC
  Filled 2020-02-04: qty 5

## 2020-02-04 NOTE — Progress Notes (Signed)
Patient has a new cough that is worse at night.

## 2020-02-04 NOTE — Progress Notes (Signed)
Hematology/Oncology Follow up note [facility] [phone] [phone]   Patient Care Team: Tracie Harrier, MD as PCP - General (Internal Medicine) Earlie Server, MD as Consulting Physician (Hematology and Oncology)  REASON FOR VISIT:  Follow up for treatment of squamous lung cancer.   HISTORY OF PRESENTING ILLNESS:  # Dec 2019 Stage I squamous lung cancer #s/p Bronchoscopy on 06/25/2018. subcarina EBUS FNA was non diagnostic, hypocellular specimen.  # 08/13/2018 CT guided right upper lobe biopsy pathology showed dense fibrosis and mixed inflammatory cells with prominent polytypic plasma cells component. Focal benign bronchial wall and alveolar spaces. No malignancy was identified.   # 08/13/2018 underwent right thoracotomy and wedge resection of a right upper lobe mass.  Frozen section was consistent with an inflammatory process.  On the second postop day, preliminary pathology reports high-grade malignancy.  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  08/20/2018 Patient was therefore taken back to the OR and underwent take complete lobectomy  pT1b pN0 cM0 stage I squamous cell lung cancer.  Margin is negative.  Recommend observation.    # 12/03/2018 CT chest w contrast showed local recurrence.  12/10/2018 PET showed No evidence of distant metastatic disease # 12/26/2018 s/p bronchoscopy biopsy. Confirmed local recurrence of squamous cell lung cancer.  medi port placed by Dr.Oaks.  Cancer treatment Started concurrent chemoradiation on 01/16/2019 Carboplatin AUC of 2 and Taxol 45 mg/m2 weekly finished in 03/05/2019.   INTERVAL HISTORY Samuel Choi is a 54 y.o. male who has above history reviewed by me today presents for evaluation prior to immunotherapy for treatment of  recurrent  squamous cell lung cancer. Patient reports having a cough, clear phlegm.  Denies any shortness of breath..  No fever, chills, nausea vomiting.  No other new  complaints.  Review of Systems  Constitutional: Negative for appetite change, chills, fatigue, fever and unexpected weight change.  HENT:   Negative for hearing loss and voice change.   Eyes: Negative for eye problems and icterus.  Respiratory: Positive for cough. Negative for chest tightness and shortness of breath.   Cardiovascular: Negative for chest pain and leg swelling.  Gastrointestinal: Negative for abdominal distention and abdominal pain.  Endocrine: Negative for hot flashes.  Genitourinary: Negative for difficulty urinating, dysuria and frequency.   Musculoskeletal: Negative for arthralgias.  Skin: Negative for itching and rash.  Neurological: Negative for light-headedness and numbness.  Hematological: Negative for adenopathy. Does not bruise/bleed easily.  Psychiatric/Behavioral: Negative for confusion.    MEDICAL HISTORY:  Past Medical History:  Diagnosis Date  . Alcohol abuse    usually drinks 2-3 drinks per day  . Atherosclerosis 06/2018  . Chronic sinusitis   . Dehydration 02/07/2019  . Emphysema of lung ([place]) 06/2018   patient unaware of this.  . Hip fracture ([place]) 05/2018   no surgery  . History of kidney stones 05/2018   per xray, bilateral nephrolitiasis  . Hypertension   . Squamous cell carcinoma of lung, right ([place]) 06/2018    SURGICAL HISTORY: Past Surgical History:  Procedure Laterality Date  . BRAIN SURGERY  10/2017   nasal/sinus endoscopy. mass benign  . ELECTROMAGNETIC NAVIGATION BROCHOSCOPY Right 06/25/2018   Procedure: ELECTROMAGNETIC NAVIGATION BRONCHOSCOPY;  Surgeon: [name], MD;  Location: [facility] OR[facility];  Service: Cardiopulmonary;  Laterality: Right;  . ENDOBRONCHIAL ULTRASOUND Right 06/25/2018   Procedure: ENDOBRONCHIAL ULTRASOUND;  Surgeon: [name], MD;  Location: [facility];  Service: Cardiopulmonary;  Laterality: Right;  . ENDOBRONCHIAL ULTRASOUND Right 12/26/2018   Procedure:  ENDOBRONCHIAL ULTRASOUND RIGHT;  Surgeon: [name], MD;   Location: [facility];  Service: Cardiopulmonary;  Laterality: Right;  . FLEXIBLE BRONCHOSCOPY N/A 08/20/2018   Procedure: FLEXIBLE BRONCHOSCOPY PREOP;  Surgeon: [name], MD;  Location: [facility] OR[facility];  Service: Thoracic;  Laterality: N/A;  . IR CV LINE INJECTION  04/04/2019  . NASAL SINUS SURGERY  10/2017   At [facility], frontal sinusotomy, ethmoidectomy, resection anterior cranial fossa neoplasm, turbinate resection  . PORTACATH PLACEMENT Left 01/15/2019   Procedure: INSERTION PORT-A-CATH;  Surgeon: [name], MD;  Location: [facility] OR[facility];  Service: General;  Laterality: Left;  . THORACOTOMY Right 08/13/2018   Procedure: PREOP BROCHOSCOPY WITH RIGHT THORACOTOMY AND RUL RESECTION;  Surgeon: [name], MD;  Location: [facility];  Service: General;  Laterality: Right;  . THORACOTOMY Right 08/20/2018   Procedure: THORACOTOMY MAJOR RIGHT UPPER LOBE LOBECTOMY;  Surgeon: [name], MD;  Location: [facility];  Service: Thoracic;  Laterality: Right;  . TOE SURGERY Left    pin in left toe    SOCIAL HISTORY: Social History   Socioeconomic History  . Marital status: Married    Spouse name: lisa  . Number of children: Not on file  . Years of education: Not on file  . Highest education level: Not on file  Occupational History  . Occupation: welding    Comment: taking time off to resolve issues  Tobacco Use  . Smoking status: Former Smoker    Packs/day: 0.50    Years: 15.00    Pack years: 7.50    Types: Cigarettes    Quit date: 08/2018    Years since quitting: 1.4  . Smokeless tobacco: Never Used  Vaping Use  . Vaping Use: Never used  Substance and Sexual Activity  . Alcohol use: Yes    Alcohol/week: 3.0 standard drinks    Types: 3 Cans of beer per week    Comment: usually 2 drinks per week, per patient  . Drug use: No  . Sexual activity: Not on file  Other Topics Concern  . Not on file  Social History Narrative  . Not on file   Social Determinants of Health   Financial Resource Strain:    . Difficulty of Paying Living Expenses:   Food Insecurity:   . Worried About Charity fundraiser in the Last Year:   . Arboriculturist in the Last Year:   Transportation Needs:   . Film/video editor (Medical):   Marland Kitchen Lack of Transportation (Non-Medical):   Physical Activity:   . Days of Exercise per Week:   . Minutes of Exercise per Session:   Stress:   . Feeling of Stress :   Social Connections:   . Frequency of Communication with Friends and Family:   . Frequency of Social Gatherings with Friends and Family:   . Attends Religious Services:   . Active Member of Clubs or Organizations:   . Attends Archivist Meetings:   Marland Kitchen Marital Status:   Intimate Partner Violence:   . Fear of Current or Ex-Partner:   . Emotionally Abused:   Marland Kitchen Physically Abused:   . Sexually Abused:     FAMILY HISTORY: Family History  Problem Relation Age of Onset  . Breast cancer Mother   . Diabetes Mother   . Lung cancer Father   . Hypertension Father     ALLERGIES:  has No Known Allergies.  MEDICATIONS:  Current Outpatient Medications  Medication Sig Dispense Refill  . albuterol (VENTOLIN HFA)  108 (90 Base) MCG/ACT inhaler Inhale 2 puffs into the lungs every 6 (six) hours as needed for wheezing or shortness of breath. 8 g 6  . cloNIDine (CATAPRES) 0.1 MG tablet Take 0.1 mg by mouth daily.   5  . diltiazem (CARDIZEM CD) 120 MG 24 hr capsule Take 120 mg by mouth daily.    . folic acid (V-R FOLIC ACID) 292 MCG tablet Take 1 tablet (400 mcg total) by mouth daily. 90 tablet 1  . hydrALAZINE (APRESOLINE) 100 MG tablet Take 50 mg by mouth 2 (two) times daily.     . hydrochlorothiazide (HYDRODIURIL) 12.5 MG tablet Take 12.5 mg by mouth daily.     Marland Kitchen KLOR-CON M20 20 MEQ tablet TAKE 1 TABLET BY MOUTH TWICE A DAY 60 tablet 0  . latanoprost (XALATAN) 0.005 % ophthalmic solution 1 drop at bedtime.    . lidocaine-prilocaine (EMLA) cream Apply to affected area once 30 g 3  . magnesium chloride  (SLOW-MAG) 64 MG TBEC SR tablet Take 1 tablet (64 mg total) by mouth 2 (two) times daily. TAKE 1 TABLET (64 MG TOTAL) BY MOUTH DAILY. 60 tablet 1  . methocarbamol (ROBAXIN) 500 MG tablet Take 1 tablet (500 mg total) by mouth at bedtime. 30 tablet 0  . olmesartan (BENICAR) 40 MG tablet Take 40 mg by mouth every other day.     . ondansetron (ZOFRAN) 8 MG tablet Take 1 tablet (8 mg total) by mouth 2 (two) times daily as needed for refractory nausea / vomiting. Start on day 3 after chemo. 30 tablet 1  . prochlorperazine (COMPAZINE) 10 MG tablet Take 1 tablet (10 mg total) by mouth every 6 (six) hours as needed (Nausea or vomiting). 30 tablet 1  . vitamin B-12 (CYANOCOBALAMIN) 1000 MCG tablet Take 1 tablet (1,000 mcg total) by mouth daily. 90 tablet 1   No current facility-administered medications for this visit.   Facility-Administered Medications Ordered in Other Visits  Medication Dose Route Frequency Provider Last Rate Last Admin  . albuterol (PROVENTIL) (2.5 MG/3ML) 0.083% nebulizer solution 2.5 mg  2.5 mg Nebulization Once System, Provider Not In      . heparin lock flush 100 unit/mL  500 Units Intravenous Once Earlie Server, MD      . sodium chloride flush (NS) 0.9 % injection 10 mL  10 mL Intravenous PRN Earlie Server, MD   10 mL at 04/04/19 0903     PHYSICAL EXAMINATION: ECOG PERFORMANCE STATUS: 0 - Asymptomatic Vitals:   02/04/20 0855  BP: (!) 162/99  Pulse: 84  Resp: 16  Temp: (!) 96.4 F (35.8 C)   Filed Weights   02/04/20 0855  Weight: 147 lb 1.6 oz (66.7 kg)    Physical Exam Constitutional:      General: He is not in acute distress. HENT:     Head: Normocephalic and atraumatic.  Eyes:     General: No scleral icterus. Cardiovascular:     Rate and Rhythm: Normal rate and regular rhythm.     Heart sounds: Normal heart sounds.  Pulmonary:     Effort: Pulmonary effort is normal. No respiratory distress.     Breath sounds: No wheezing.  Abdominal:     General: Bowel sounds are  normal. There is no distension.     Palpations: Abdomen is soft.  Musculoskeletal:        General: No deformity. Normal range of motion.     Cervical back: Normal range of motion and neck supple.  Skin:  General: Skin is warm and dry.     Findings: No erythema or rash.  Neurological:     General: No focal deficit present.     Mental Status: He is alert and oriented to person, place, and time. Mental status is at baseline.     Cranial Nerves: No cranial nerve deficit.     Coordination: Coordination normal.  Psychiatric:        Mood and Affect: Mood normal.      LABORATORY DATA:  I have reviewed the data as listed Lab Results  Component Value Date   WBC 9.9 01/21/2020   HGB 14.9 01/21/2020   HCT 41.8 01/21/2020   MCV 103.2 (H) 01/21/2020   PLT 155 01/21/2020   Recent Labs    12/24/19 0842 01/07/20 0925 01/21/20 0818  NA 141 141 137  K 3.4* 4.4 3.7  CL 102 104 104  CO2 _0 GLUCOSE 124* 89 137*  BUN _1 CREATININE 1.01 1.09 0.95  CALCIUM 9.0 9.1 9.0  GFRNONAA >60 >60 >60  GFRAA >60 >60 >60  PROT 7.4 7.9 7.3  ALBUMIN 3.6 3.8 3.4*  AST 42* 46* 33  ALT _2 ALKPHOS 82 97 84  BILITOT 0.6 0.7 0.5   Iron/TIBC/Ferritin/ %Sat No results found for: IRON, TIBC, FERRITIN, IRONPCTSAT   Pathology 12/26/2018 Right hilar adenopathy/mass-EUS guided FNA Positive for malignancy, non small cell carcinoma, morphologically similar to previous right upper lobe squamous cell carcinoma.   ASSESSMENT & PLAN:  1. Squamous cell carcinoma of lung, right ([place])   2. Hypomagnesemia   3. Encounter for antineoplastic immunotherapy   4. Hypokalemia   5. Cough    #Recurrent Squamous cell carcinoma of lung,  Currently on maintenance immunotherapy with durvalumab every 2 weeks Hold treatment due to acute symptoms.  #Cough for 3 days, check x-ray.-X-ray was independently viewed.  No acute changes.  Trace pleural effusion. Patient has CT scheduled later this week.  He has  no leukocytosis or fever.  I will hold off antibiotics as of now.  #Tachycardia, resolved.  On diltiazem. #Chronic hypomagnesemia. -Continue slow magnesium twice daily.  Magnesium level is 1.5, proceed with IV magnesium 2 g today.  #Chronic hypokalemia, potassium level is normal today.  Continue maintenance potassium chloride 20 mEq daily.  Potassium level is 3.3 today.  Patient will receive 20 mEq potassium chloride IV today.  Return of visit: 2 weeks.    Earlie Server, MD, PhD Hematology Oncology [facility] 02/04/2020

## 2020-02-06 ENCOUNTER — Other Ambulatory Visit: Payer: Self-pay

## 2020-02-06 ENCOUNTER — Ambulatory Visit
Admission: RE | Admit: 2020-02-06 | Discharge: 2020-02-06 | Disposition: A | Discharge status: Home / Self Care | Payer: Managed Care, Other (non HMO) | Source: Ambulatory Visit | Attending: Radiation Oncology | Admitting: Radiation Oncology

## 2020-02-06 DIAGNOSIS — C3491 Malignant neoplasm of unspecified part of right bronchus or lung: Secondary | ICD-10-CM | POA: Insufficient documentation

## 2020-02-06 MED ORDER — IOHEXOL 300 MG/ML  SOLN
75.0000 mL | Freq: Once | INTRAMUSCULAR | Status: AC | PRN
  Administered 2020-02-06: 75 mL via INTRAVENOUS

## 2020-02-13 ENCOUNTER — Ambulatory Visit: Payer: Managed Care, Other (non HMO) | Admitting: Radiation Oncology

## 2020-02-17 ENCOUNTER — Encounter: Payer: Self-pay | Admitting: Oncology

## 2020-02-17 NOTE — Progress Notes (Signed)
Pt prescreened for follow up appt. No new concerns voiced.

## 2020-02-18 ENCOUNTER — Inpatient Hospital Stay: Payer: Managed Care, Other (non HMO) | Attending: Oncology

## 2020-02-18 ENCOUNTER — Inpatient Hospital Stay (HOSPITAL_BASED_OUTPATIENT_CLINIC_OR_DEPARTMENT_OTHER): Payer: Managed Care, Other (non HMO) | Admitting: Oncology

## 2020-02-18 ENCOUNTER — Inpatient Hospital Stay: Payer: Managed Care, Other (non HMO)

## 2020-02-18 ENCOUNTER — Other Ambulatory Visit: Payer: Self-pay

## 2020-02-18 VITALS — BP 168/82 | HR 88 | Temp 97.4°F | Resp 18 | Wt 144.8 lb

## 2020-02-18 VITALS — BP 167/106 | HR 83 | Resp 16

## 2020-02-18 DIAGNOSIS — D7589 Other specified diseases of blood and blood-forming organs: Secondary | ICD-10-CM | POA: Insufficient documentation

## 2020-02-18 DIAGNOSIS — Z5112 Encounter for antineoplastic immunotherapy: Secondary | ICD-10-CM | POA: Insufficient documentation

## 2020-02-18 DIAGNOSIS — Z8249 Family history of ischemic heart disease and other diseases of the circulatory system: Secondary | ICD-10-CM | POA: Insufficient documentation

## 2020-02-18 DIAGNOSIS — Z902 Acquired absence of lung [part of]: Secondary | ICD-10-CM | POA: Insufficient documentation

## 2020-02-18 DIAGNOSIS — Z801 Family history of malignant neoplasm of trachea, bronchus and lung: Secondary | ICD-10-CM | POA: Diagnosis not present

## 2020-02-18 DIAGNOSIS — C3491 Malignant neoplasm of unspecified part of right bronchus or lung: Secondary | ICD-10-CM | POA: Insufficient documentation

## 2020-02-18 DIAGNOSIS — Z923 Personal history of irradiation: Secondary | ICD-10-CM | POA: Diagnosis not present

## 2020-02-18 DIAGNOSIS — Z803 Family history of malignant neoplasm of breast: Secondary | ICD-10-CM | POA: Insufficient documentation

## 2020-02-18 DIAGNOSIS — Z833 Family history of diabetes mellitus: Secondary | ICD-10-CM | POA: Diagnosis not present

## 2020-02-18 DIAGNOSIS — I2510 Atherosclerotic heart disease of native coronary artery without angina pectoris: Secondary | ICD-10-CM | POA: Diagnosis not present

## 2020-02-18 DIAGNOSIS — I10 Essential (primary) hypertension: Secondary | ICD-10-CM | POA: Insufficient documentation

## 2020-02-18 DIAGNOSIS — Z79899 Other long term (current) drug therapy: Secondary | ICD-10-CM | POA: Insufficient documentation

## 2020-02-18 DIAGNOSIS — Z87891 Personal history of nicotine dependence: Secondary | ICD-10-CM | POA: Diagnosis not present

## 2020-02-18 LAB — COMPREHENSIVE METABOLIC PANEL
ALT: 19 U/L (ref 0–44)
AST: 31 U/L (ref 15–41)
Albumin: 3.7 g/dL (ref 3.5–5.0)
Alkaline Phosphatase: 103 U/L (ref 38–126)
Anion gap: 11 (ref 5–15)
BUN: 9 mg/dL (ref 6–20)
CO2: 24 mmol/L (ref 22–32)
Calcium: 9.2 mg/dL (ref 8.9–10.3)
Chloride: 102 mmol/L (ref 98–111)
Creatinine, Ser: 0.91 mg/dL (ref 0.61–1.24)
GFR calc Af Amer: >60 mL/min (ref >60)
GFR calc non Af Amer: >60 mL/min (ref >60)
Glucose, Bld: 114 mg/dL — ABNORMAL HIGH (ref 70–99)
Potassium: 3.8 mmol/L (ref 3.5–5.1)
Sodium: 137 mmol/L (ref 135–145)
Total Bilirubin: 1.1 mg/dL (ref 0.3–1.2)
Total Protein: 7.8 g/dL (ref 6.5–8.1)

## 2020-02-18 LAB — CBC WITH DIFFERENTIAL/PLATELET
Abs Immature Granulocytes: 0.04 10*3/uL (ref 0.00–0.07)
Basophils Absolute: 0.1 10*3/uL (ref 0.0–0.1)
Basophils Relative: 1 %
Eosinophils Absolute: 0.4 10*3/uL (ref 0.0–0.5)
Eosinophils Relative: 4 %
HCT: 46.7 % (ref 39.0–52.0)
Hemoglobin: 16.4 g/dL (ref 13.0–17.0)
Immature Granulocytes: 0 %
Lymphocytes Relative: 8 %
Lymphs Abs: 0.8 10*3/uL (ref 0.7–4.0)
MCH: 35.7 pg — ABNORMAL HIGH (ref 26.0–34.0)
MCHC: 35.1 g/dL (ref 30.0–36.0)
MCV: 101.7 fL — ABNORMAL HIGH (ref 80.0–100.0)
Monocytes Absolute: 0.7 10*3/uL (ref 0.1–1.0)
Monocytes Relative: 7 %
Neutro Abs: 8.1 10*3/uL — ABNORMAL HIGH (ref 1.7–7.7)
Neutrophils Relative %: 80 %
Platelets: 150 10*3/uL (ref 150–400)
RBC: 4.59 MIL/uL (ref 4.22–5.81)
RDW: 12.7 % (ref 11.5–15.5)
WBC: 10.1 10*3/uL (ref 4.0–10.5)
nRBC: 0 % (ref 0.0–0.2)

## 2020-02-18 LAB — MAGNESIUM: Magnesium: 1.7 mg/dL (ref 1.7–2.4)

## 2020-02-18 LAB — TSH: TSH: 0.751 u[IU]/mL (ref 0.350–4.500)

## 2020-02-18 MED ORDER — SODIUM CHLORIDE 0.9% FLUSH
10.0000 mL | Freq: Once | INTRAVENOUS | Status: DC
  Filled 2020-02-18: qty 10

## 2020-02-18 MED ORDER — HEPARIN SOD (PORK) LOCK FLUSH 100 UNIT/ML IV SOLN
INTRAVENOUS | Status: AC
  Filled 2020-02-18: qty 5

## 2020-02-18 MED ORDER — HEPARIN SOD (PORK) LOCK FLUSH 100 UNIT/ML IV SOLN
500.0000 [IU] | Freq: Once | INTRAVENOUS | Status: AC
  Administered 2020-02-18: 500 [IU] via INTRAVENOUS
  Filled 2020-02-18: qty 5

## 2020-02-18 MED ORDER — SODIUM CHLORIDE 0.9 % IV SOLN
10.0000 mg/kg | Freq: Once | INTRAVENOUS | Status: AC
  Administered 2020-02-18: 620 mg via INTRAVENOUS
  Filled 2020-02-18: qty 2.4

## 2020-02-18 MED ORDER — SODIUM CHLORIDE 0.9 % IV SOLN
Freq: Once | INTRAVENOUS | Status: AC
  Filled 2020-02-18: qty 250

## 2020-02-18 NOTE — Progress Notes (Signed)
No blood return noted from port. Per Dr. Tasia Catchings, okay to continue with Durvalumab.   BP 167/106 after treatment. Dr Tasia Catchings and team made aware. Educated patient on s/s as to when to seek emergency care and to discuss HTN with PCP. Patient verbalizes understanding and denies any further questions or concerns.

## 2020-02-18 NOTE — Progress Notes (Signed)
Hematology/Oncology Follow up note [facility] [phone] [phone]) [phone]   Patient Care Team: Tracie Harrier, MD as PCP - General (Internal Medicine) Earlie Server, MD as Consulting Physician (Hematology and Oncology)  REASON FOR VISIT:  Follow up for treatment of squamous lung cancer.   HISTORY OF PRESENTING ILLNESS:  # Dec 2019 Stage I squamous lung cancer #s/p Bronchoscopy on 06/25/2018. subcarina EBUS FNA was non diagnostic, hypocellular specimen.  # 08/13/2018 CT guided right upper lobe biopsy pathology showed dense fibrosis and mixed inflammatory cells with prominent polytypic plasma cells component. Focal benign bronchial wall and alveolar spaces. No malignancy was identified.   # 08/13/2018 underwent right thoracotomy and wedge resection of a right upper lobe mass.  Frozen section was consistent with an inflammatory process.  On the second postop day, preliminary pathology reports high-grade malignancy.  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  08/20/2018 Patient was therefore taken back to the OR and underwent take complete lobectomy  pT1b pN0 cM0 stage I squamous cell lung cancer.  Margin is negative.  Recommend observation.    # 12/03/2018 CT chest w contrast showed local recurrence.  12/10/2018 PET showed No evidence of distant metastatic disease # 12/26/2018 s/p bronchoscopy biopsy. Confirmed local recurrence of squamous cell lung cancer.  medi port placed by Dr.Oaks.  Cancer treatment Started concurrent chemoradiation on 01/16/2019 Carboplatin AUC of 2 and Taxol 45 mg/m2 weekly finished in 03/05/2019.   INTERVAL HISTORY Samuel Choi is a 54 y.o. male who has above history reviewed by me today presents for evaluation prior to immunotherapy for treatment of  recurrent  squamous cell lung cancer. Patient has developed cough around last visits and x-ray and a CT scan was obtained.  Patient recalls that he developed symptoms after  spraying Roundup.  Now the symptom has improved Otherwise no new complaints.  He admits that he previously has been missing doses of his potassium and magnesium supplementation.  Since last visit, he has been compliant with magnesium supplementation.  Today electrolytes levels were normal  Review of Systems  Constitutional: Negative for appetite change, chills, diaphoresis, fatigue, fever and unexpected weight change.  HENT:   Negative for hearing loss, lump/mass, nosebleeds, sore throat and voice change.   Eyes: Negative for eye problems and icterus.  Respiratory: Negative for chest tightness, cough, hemoptysis, shortness of breath and wheezing.   Cardiovascular: Negative for chest pain and leg swelling.  Gastrointestinal: Negative for abdominal distention, abdominal pain, blood in stool, diarrhea, nausea and rectal pain.  Endocrine: Negative for hot flashes.  Genitourinary: Negative for bladder incontinence, difficulty urinating, dysuria, frequency, hematuria and nocturia.   Musculoskeletal: Negative for arthralgias, back pain, flank pain, gait problem and myalgias.  Skin: Negative for itching and rash.  Neurological: Negative for dizziness, gait problem, headaches, light-headedness, numbness and seizures.  Hematological: Negative for adenopathy. Does not bruise/bleed easily.  Psychiatric/Behavioral: Negative for confusion and decreased concentration. The patient is not nervous/anxious.     MEDICAL HISTORY:  Past Medical History:  Diagnosis Date  . Alcohol abuse    usually drinks 2-3 drinks per day  . Atherosclerosis 06/2018  . Chronic sinusitis   . Dehydration 02/07/2019  . Emphysema of lung ([place]) 06/2018   patient unaware of this.  . Hip fracture ([place]) 05/2018   no surgery  . History of kidney stones 05/2018   per xray, bilateral nephrolitiasis  . Hypertension   . Squamous cell carcinoma of lung, right ([place]) 06/2018    SURGICAL HISTORY:  Past Surgical History:  Procedure  Laterality Date  . BRAIN SURGERY  10/2017   nasal/sinus endoscopy. mass benign  . ELECTROMAGNETIC NAVIGATION BROCHOSCOPY Right 06/25/2018   Procedure: ELECTROMAGNETIC NAVIGATION BRONCHOSCOPY;  Surgeon: [name], MD;  Location: [facility];  Service: Cardiopulmonary;  Laterality: Right;  . ENDOBRONCHIAL ULTRASOUND Right 06/25/2018   Procedure: ENDOBRONCHIAL ULTRASOUND;  Surgeon: [name], MD;  Location: [facility];  Service: Cardiopulmonary;  Laterality: Right;  . ENDOBRONCHIAL ULTRASOUND Right 12/26/2018   Procedure: ENDOBRONCHIAL ULTRASOUND RIGHT;  Surgeon: [name], MD;  Location: [facility];  Service: Cardiopulmonary;  Laterality: Right;  . FLEXIBLE BRONCHOSCOPY N/A 08/20/2018   Procedure: FLEXIBLE BRONCHOSCOPY PREOP;  Surgeon: [name], MD;  Location: [facility];  Service: Thoracic;  Laterality: N/A;  . IR CV LINE INJECTION  04/04/2019  . NASAL SINUS SURGERY  10/2017   At [facility], frontal sinusotomy, ethmoidectomy, resection anterior cranial fossa neoplasm, turbinate resection  . PORTACATH PLACEMENT Left 01/15/2019   Procedure: INSERTION PORT-A-CATH;  Surgeon: [name], MD;  Location: [facility];  Service: General;  Laterality: Left;  . THORACOTOMY Right 08/13/2018   Procedure: PREOP BROCHOSCOPY WITH RIGHT THORACOTOMY AND RUL RESECTION;  Surgeon: [name], MD;  Location: [facility];  Service: General;  Laterality: Right;  . THORACOTOMY Right 08/20/2018   Procedure: THORACOTOMY MAJOR RIGHT UPPER LOBE LOBECTOMY;  Surgeon: [name], MD;  Location: [facility];  Service: Thoracic;  Laterality: Right;  . TOE SURGERY Left    pin in left toe    SOCIAL HISTORY: Social History   Socioeconomic History  . Marital status: Married    Spouse name: lisa  . Number of children: Not on file  . Years of education: Not on file  . Highest education level: Not on file  Occupational History  . Occupation: welding    Comment: taking time off to resolve issues  Tobacco Use  . Smoking status:  Former Smoker    Packs/day: 0.50    Years: 15.00    Pack years: 7.50    Types: Cigarettes    Quit date: 08/2018    Years since quitting: 1.5  . Smokeless tobacco: Never Used  Vaping Use  . Vaping Use: Never used  Substance and Sexual Activity  . Alcohol use: Yes    Alcohol/week: 3.0 standard drinks    Types: 3 Cans of beer per week    Comment: usually 2 drinks per week, per patient  . Drug use: No  . Sexual activity: Not on file  Other Topics Concern  . Not on file  Social History Narrative  . Not on file   Social Determinants of Health   Financial Resource Strain:   . Difficulty of Paying Living Expenses:   Food Insecurity:   . Worried About Charity fundraiser in the Last Year:   . Arboriculturist in the Last Year:   Transportation Needs:   . Film/video editor (Medical):   Marland Kitchen Lack of Transportation (Non-Medical):   Physical Activity:   . Days of Exercise per Week:   . Minutes of Exercise per Session:   Stress:   . Feeling of Stress :   Social Connections:   . Frequency of Communication with Friends and Family:   . Frequency of Social Gatherings with Friends and Family:   . Attends Religious Services:   . Active Member of Clubs or Organizations:   . Attends Archivist Meetings:   Marland Kitchen Marital Status:   Intimate Partner Violence:   .  Fear of Current or Ex-Partner:   . Emotionally Abused:   Marland Kitchen Physically Abused:   . Sexually Abused:     FAMILY HISTORY: Family History  Problem Relation Age of Onset  . Breast cancer Mother   . Diabetes Mother   . Lung cancer Father   . Hypertension Father     ALLERGIES:  has No Known Allergies.  MEDICATIONS:  Current Outpatient Medications  Medication Sig Dispense Refill  . albuterol (VENTOLIN HFA) 108 (90 Base) MCG/ACT inhaler Inhale 2 puffs into the lungs every 6 (six) hours as needed for wheezing or shortness of breath. 8 g 6  . cloNIDine (CATAPRES) 0.1 MG tablet Take 0.1 mg by mouth daily.   5  . diltiazem  (CARDIZEM CD) 120 MG 24 hr capsule Take 120 mg by mouth daily.    . folic acid (V-R FOLIC ACID) 354 MCG tablet Take 1 tablet (400 mcg total) by mouth daily. 90 tablet 1  . hydrALAZINE (APRESOLINE) 100 MG tablet Take 50 mg by mouth 2 (two) times daily.     . hydrochlorothiazide (HYDRODIURIL) 12.5 MG tablet Take 12.5 mg by mouth daily.     Marland Kitchen KLOR-CON M20 20 MEQ tablet TAKE 1 TABLET BY MOUTH TWICE A DAY 60 tablet 0  . latanoprost (XALATAN) 0.005 % ophthalmic solution 1 drop at bedtime.    . lidocaine-prilocaine (EMLA) cream Apply to affected area once 30 g 3  . magnesium chloride (SLOW-MAG) 64 MG TBEC SR tablet Take 1 tablet (64 mg total) by mouth 2 (two) times daily. TAKE 1 TABLET (64 MG TOTAL) BY MOUTH DAILY. 60 tablet 1  . methocarbamol (ROBAXIN) 500 MG tablet Take 1 tablet (500 mg total) by mouth at bedtime. 30 tablet 0  . olmesartan (BENICAR) 40 MG tablet Take 40 mg by mouth every other day.     . ondansetron (ZOFRAN) 8 MG tablet Take 1 tablet (8 mg total) by mouth 2 (two) times daily as needed for refractory nausea / vomiting. Start on day 3 after chemo. 30 tablet 1  . prochlorperazine (COMPAZINE) 10 MG tablet Take 1 tablet (10 mg total) by mouth every 6 (six) hours as needed (Nausea or vomiting). 30 tablet 1  . vitamin B-12 (CYANOCOBALAMIN) 1000 MCG tablet Take 1 tablet (1,000 mcg total) by mouth daily. 90 tablet 1   No current facility-administered medications for this visit.   Facility-Administered Medications Ordered in Other Visits  Medication Dose Route Frequency Provider Last Rate Last Admin  . albuterol (PROVENTIL) (2.5 MG/3ML) 0.083% nebulizer solution 2.5 mg  2.5 mg Nebulization Once System, Provider Not In      . heparin lock flush 100 unit/mL  500 Units Intravenous Once Earlie Server, MD      . sodium chloride flush (NS) 0.9 % injection 10 mL  10 mL Intravenous PRN Earlie Server, MD   10 mL at 04/04/19 0903  . sodium chloride flush (NS) 0.9 % injection 10 mL  10 mL Intravenous Once Earlie Server, MD         PHYSICAL EXAMINATION: ECOG PERFORMANCE STATUS: 0 - Asymptomatic Vitals:   02/18/20 0856  BP: (!) 168/82  Pulse: 88  Resp: 18  Temp: (!) 97.4 F (36.3 C)   Filed Weights   02/18/20 0856  Weight: 144 lb 12.8 oz (65.7 kg)    Physical Exam Constitutional:      General: He is not in acute distress. HENT:     Head: Normocephalic and atraumatic.  Eyes:  General: No scleral icterus. Cardiovascular:     Rate and Rhythm: Normal rate and regular rhythm.     Heart sounds: Normal heart sounds.  Pulmonary:     Effort: Pulmonary effort is normal. No respiratory distress.     Breath sounds: No wheezing.  Abdominal:     General: Bowel sounds are normal. There is no distension.     Palpations: Abdomen is soft.  Musculoskeletal:        General: No deformity. Normal range of motion.     Cervical back: Normal range of motion and neck supple.  Skin:    General: Skin is warm and dry.     Findings: No erythema or rash.  Neurological:     General: No focal deficit present.     Mental Status: He is alert and oriented to person, place, and time. Mental status is at baseline.     Cranial Nerves: No cranial nerve deficit.     Coordination: Coordination normal.  Psychiatric:        Mood and Affect: Mood normal.      LABORATORY DATA:  I have reviewed the data as listed Lab Results  Component Value Date   WBC 10.1 02/18/2020   HGB 16.4 02/18/2020   HCT 46.7 02/18/2020   MCV 101.7 (H) 02/18/2020   PLT 150 02/18/2020   Recent Labs    01/21/20 0818 02/04/20 0814 02/18/20 0817  NA 137 138 137  K 3.7 3.3* 3.8  CL 104 105 102  CO2 _0 GLUCOSE 137* 120* 114*  BUN _1 CREATININE 0.95 1.01 0.91  CALCIUM 9.0 8.8* 9.2  GFRNONAA >60 >60 >60  GFRAA >60 >60 >60  PROT 7.3 7.1 7.8  ALBUMIN 3.4* 3.3* 3.7  AST 33 23 31  ALT _2 ALKPHOS 84 86 103  BILITOT 0.5 0.8 1.1   Iron/TIBC/Ferritin/ %Sat No results found for: IRON, TIBC, FERRITIN, IRONPCTSAT    RADIOGRAPHIC STUDIES: I have personally reviewed the radiological images as listed and agreed with the findings in the report. DG Chest 2 View  Result Date: 02/04/2020 CLINICAL DATA:  Cough, history of lung cancer EXAM: CHEST - 2 VIEW COMPARISON:  01/15/2019 FINDINGS: Post right upper lobectomy. Interstitial thickening is present likely reflecting scarring/radiation change. Trace right pleural effusion remains present. Stable cardiomediastinal contours with normal heart size. Left chest wall port catheter tip overlies SVC. No acute osseous abnormality. IMPRESSION: Probable postoperative and radiation changes. Trace right pleural effusion. Electronically Signed   By: Macy Mis M.D.   On: 02/04/2020 16:58   CT Chest Wo Contrast  Result Date: 11/21/2019 CLINICAL DATA:  Follow-up right squamous cell lung cancer. EXAM: CT CHEST WITHOUT CONTRAST TECHNIQUE: Multidetector CT imaging of the chest was performed following the standard protocol without IV contrast. COMPARISON:  08/12/2019 FINDINGS: Cardiovascular: Heart size is normal. Small pericardial effusion around the base of heart is unchanged. Aortic atherosclerosis. Left circumflex coronary artery atherosclerotic calcifications. Mediastinum/Nodes: No enlarged mediastinal or axillary lymph nodes. Thyroid gland, trachea, and esophagus demonstrate no significant findings. Lungs/Pleura: Mild emphysema. No pleural effusion or pneumothorax. Status post right upper lobectomy. Radiation change is identified within the medial aspect of the right lower lobe. Multiple new tiny, nonspecific lung nodules are identified bilaterally including: New subpleural nodule in the right middle lobe measures 3 mm, image 69/3, nonspecific. 2 mm nodule within the right lower lobe is also new, image 78/3. Nonspecific. Medial left lower lobe lung nodule measures 3 mm,  image 110/3. 2 mm posterior left lower lobe lung nodule is also noted and appears new from previous exam, image  121/3. Left upper lobe lung nodule measures 3 mm, image 63/3. Also new. Upper Abdomen: No acute abnormality. Bilateral nephrolithiasis. Musculoskeletal: No chest wall mass or suspicious bone lesions identified. IMPRESSION: 1. Status post right upper lobectomy. Radiation change is identified within the medial aspect of the right lower lobe. No highly specific findings identified to suggest recurrent tumor or metastatic disease within the chest. 2. Multiple new tiny, nonspecific lung nodules are identified bilaterally. Given the multiplicity of these nodules consider short-term interval follow-up in 3-6 months assess for temporal change in the appearance of these nodules. 3. Aortic atherosclerosis, in addition to left circumflex coronary artery disease. Please note that although the presence of coronary artery calcium documents the presence of coronary artery disease, the severity of this disease and any potential stenosis cannot be assessed on this non-gated CT examination. Assessment for potential risk factor modification, dietary therapy or pharmacologic therapy may be warranted, if clinically indicated. 4. Aortic Atherosclerosis (ICD10-I70.0) and Emphysema (ICD10-J43.9). Electronically Signed   By: Kerby Moors M.D.   On: 11/21/2019 16:28   CT CHEST W CONTRAST  Result Date: 02/06/2020 CLINICAL DATA:  Lung cancer treated with surgery, chemotherapy and radiation therapy. EXAM: CT CHEST WITH CONTRAST TECHNIQUE: Multidetector CT imaging of the chest was performed during intravenous contrast administration. CONTRAST:  76m OMNIPAQUE IOHEXOL 300 MG/ML  SOLN COMPARISON:  11/21/2019. FINDINGS: Cardiovascular: Left IJ Port-A-Cath terminates in the SVC. Atherosclerotic calcification of the aorta and coronary arteries. Pulmonic trunk is enlarged. Heart size normal. Small amount of pericardial fluid appears similar. Mediastinum/Nodes: Subcentimeter low-attenuation nodules in the thyroid. No follow-up recommended (ref: J  Am Coll Radiol. 2015 Feb;12(2): 143-50).Low internal jugular lymph nodes are not enlarged by CT size criteria. No pathologically enlarged mediastinal, left hilar or axillary lymph nodes. Evaluation of the right hilum is somewhat limited due to soft tissue thickening and postoperative changes. Esophagus is grossly unremarkable. Lungs/Pleura: Right upper lobectomy with associated pleuroparenchymal scarring and post radiation architectural distortion in the medial right hemithorax. Peribronchovascular nodularity in the right lower lobe is new or increased from the prior exam. In addition to pre-existing stable pulmonary nodules, there are new bilateral pulmonary nodules measuring up to 5 mm in the medial right lower lobe (3/73). Centrilobular emphysema. Tiny right pleural effusion with associated pleural thickening, stable. Airway is otherwise unremarkable. Upper Abdomen: Visualized portions of the liver, gallbladder and adrenal glands are unremarkable. Stones are seen in the kidneys bilaterally. Renal cortical scarring on the right. Subcentimeter low-attenuation lesion in the left kidney is partially imaged. Visualized portions of the kidneys, spleen, pancreas, stomach and bowel are otherwise unremarkable. No upper abdominal adenopathy. Musculoskeletal: Degenerative changes in the spine. No worrisome lytic or sclerotic lesions. IMPRESSION: 1. Postoperative and post radiation changes in the right lung. Scattered new bilateral pulmonary nodules are worrisome for metastatic disease. 2. Chronic small right fibrothorax. 3. Bilateral renal stones. 4. Aortic atherosclerosis (ICD10-I70.0). Coronary artery calcification. 5. Enlarged pulmonic trunk, indicative of pulmonary arterial hypertension. 6.  Emphysema (ICD10-J43.9). Electronically Signed   By: MLorin PicketM.D.   On: 02/06/2020 14:03     ASSESSMENT & PLAN:  1. Squamous cell carcinoma of lung, right ([place]   2. Encounter for antineoplastic immunotherapy   3.  Hypomagnesemia   4. Macrocytosis without anemia    #Recurrent Squamous cell carcinoma of lung,  Currently on maintenance immunotherapy with durvalumab every 2 weeks  Resume durvalumab maintenance.  Proceed with treatment today. 02/06/2020, CT chest with contrast was obtained which showed postoperative and postradiation changes in the right lung. There are scattered new bilateral pulmonary nodules, questionable reactive versus metastatic disease.  Nodules are too small (up to 67m)  to be biopsied or show metabolic activities on PET scan., combining his most recent sudden onset and spontaneous resolution of cough after spraying Roundup, likely reactive nodules.  I will obtain short-term CT for follow-up.  #Chronic hypomagnesemia. -Continue slow magnesium twice daily.   #Chronic hypokalemia, potassium level is normal today.  Continue maintenance potassium chloride 20 mEq daily.   Return of visit: 2 weeks.    ZEarlie Server MD, PhD [facility]ology [facility]8/05/2020

## 2020-03-01 ENCOUNTER — Encounter: Payer: Self-pay | Admitting: Radiation Oncology

## 2020-03-01 ENCOUNTER — Other Ambulatory Visit: Payer: Self-pay

## 2020-03-01 ENCOUNTER — Ambulatory Visit
Admission: RE | Admit: 2020-03-01 | Discharge: 2020-03-01 | Disposition: A | Discharge status: Home / Self Care | Payer: Managed Care, Other (non HMO) | Source: Ambulatory Visit | Attending: Radiation Oncology | Admitting: Radiation Oncology

## 2020-03-01 VITALS — BP 172/102 | HR 113 | Temp 97.6°F | Resp 16 | Wt 141.2 lb

## 2020-03-01 DIAGNOSIS — Z9221 Personal history of antineoplastic chemotherapy: Secondary | ICD-10-CM | POA: Diagnosis not present

## 2020-03-01 DIAGNOSIS — Z923 Personal history of irradiation: Secondary | ICD-10-CM | POA: Insufficient documentation

## 2020-03-01 DIAGNOSIS — C3411 Malignant neoplasm of upper lobe, right bronchus or lung: Secondary | ICD-10-CM | POA: Insufficient documentation

## 2020-03-01 DIAGNOSIS — R918 Other nonspecific abnormal finding of lung field: Secondary | ICD-10-CM | POA: Insufficient documentation

## 2020-03-01 NOTE — Progress Notes (Signed)
Radiation Oncology Follow up Note  Name: Samuel Choi   Date:   03/01/2020 MRN:  [id number] DOB: 10-01-1965    This 54 y.o. male presents to the clinic today for close to 1 year follow-up status post concurrent chemoradiation therapy for squamous cell carcinoma the right upper lobe initial stage I (T1b N0 M0) who developed a hilar recurrence and treated at that time.Marland Kitchen  REFERRING PROVIDER: Tracie Harrier, MD  HPI: Patient is a 54 year old male now seen out close to 1 year having completed concurrent chemo radiation for recurrent squamous cell carcinoma the right upper lobe initially stage I that presented with hilar recurrence he is seen today in routine follow-up and is clinically doing well specifically [place] cough hemoptysis chest tightness or weight loss.Marland Kitchen  His recent CT scan showed postoperative and post radiation changes in the right lung although he has scattered new bilateral pulmonary nodules which may be worrisome for metastatic disease.  This is being clinically followed at this time.  He is currently ondurvalumab maintenance which she is tolerating well.  Lesions of her lung are too small to be biopsied or show metabolic activity on PET scan.  COMPLICATIONS OF TREATMENT: none  FOLLOW UP COMPLIANCE: keeps appointments   PHYSICAL EXAM:  BP (!) 172/102 (BP Location: Right Arm, Patient Position: Sitting)   Pulse (!) 113   Temp 97.6 F (36.4 C) (Tympanic)   Resp 16   Wt 141 lb 3.2 oz (64 kg)   BMI 18.63 kg/m  Well-developed well-nourished patient in NAD. HEENT reveals PERLA, EOMI, discs not visualized.  Oral cavity is clear. No oral mucosal lesions are identified. Neck is clear without evidence of cervical or supraclavicular adenopathy. Lungs are clear to A&P. Cardiac examination is essentially unremarkable with regular rate and rhythm without murmur rub or thrill. Abdomen is benign with no organomegaly or masses noted. Motor sensory and DTR levels are equal and symmetric in the  upper and lower extremities. Cranial nerves II through XII are grossly intact. Proprioception is intact. No peripheral adenopathy or edema is identified. No motor or sensory levels are noted. Crude visual fields are within normal range.  RADIOLOGY RESULTS: CT scans reviewed compatible with above-stated findings  PLAN: Present time I agree with continued observation and continued with immunotherapy maintenance at this time.  He continues close follow-up care and treatment with medical oncology.  I have asked to see him back in 6 months for follow-up.  I would be happy to reevaluate the patient anytime should further treatment be indicated.  Patient comprehends her recommendations well.  I would like to take this opportunity to thank you for allowing me to participate in the care of your patient.Noreene Filbert, MD

## 2020-03-03 ENCOUNTER — Inpatient Hospital Stay: Payer: Managed Care, Other (non HMO)

## 2020-03-03 ENCOUNTER — Other Ambulatory Visit: Payer: Self-pay

## 2020-03-03 ENCOUNTER — Inpatient Hospital Stay (HOSPITAL_BASED_OUTPATIENT_CLINIC_OR_DEPARTMENT_OTHER): Payer: Managed Care, Other (non HMO) | Admitting: Oncology

## 2020-03-03 ENCOUNTER — Encounter: Payer: Self-pay | Admitting: Oncology

## 2020-03-03 VITALS — BP 126/81 | HR 106 | Temp 97.9°F | Resp 18 | Wt 141.6 lb

## 2020-03-03 VITALS — HR 96

## 2020-03-03 DIAGNOSIS — D7589 Other specified diseases of blood and blood-forming organs: Secondary | ICD-10-CM | POA: Diagnosis not present

## 2020-03-03 DIAGNOSIS — C3491 Malignant neoplasm of unspecified part of right bronchus or lung: Secondary | ICD-10-CM

## 2020-03-03 DIAGNOSIS — Z5112 Encounter for antineoplastic immunotherapy: Secondary | ICD-10-CM | POA: Diagnosis not present

## 2020-03-03 DIAGNOSIS — R000 Tachycardia, unspecified: Secondary | ICD-10-CM

## 2020-03-03 DIAGNOSIS — E860 Dehydration: Secondary | ICD-10-CM

## 2020-03-03 LAB — COMPREHENSIVE METABOLIC PANEL
ALT: 23 U/L (ref 0–44)
AST: 31 U/L (ref 15–41)
Albumin: 3.6 g/dL (ref 3.5–5.0)
Alkaline Phosphatase: 97 U/L (ref 38–126)
Anion gap: 13 (ref 5–15)
BUN: 10 mg/dL (ref 6–20)
CO2: 23 mmol/L (ref 22–32)
Calcium: 9.4 mg/dL (ref 8.9–10.3)
Chloride: 105 mmol/L (ref 98–111)
Creatinine, Ser: 1.04 mg/dL (ref 0.61–1.24)
GFR calc Af Amer: >60 mL/min (ref >60)
GFR calc non Af Amer: >60 mL/min (ref >60)
Glucose, Bld: 116 mg/dL — ABNORMAL HIGH (ref 70–99)
Potassium: 3.9 mmol/L (ref 3.5–5.1)
Sodium: 141 mmol/L (ref 135–145)
Total Bilirubin: 0.4 mg/dL (ref 0.3–1.2)
Total Protein: 7.7 g/dL (ref 6.5–8.1)

## 2020-03-03 LAB — CBC WITH DIFFERENTIAL/PLATELET
Abs Immature Granulocytes: 0.05 10*3/uL (ref 0.00–0.07)
Basophils Absolute: 0.1 10*3/uL (ref 0.0–0.1)
Basophils Relative: 1 %
Eosinophils Absolute: 0.4 10*3/uL (ref 0.0–0.5)
Eosinophils Relative: 3 %
HCT: 47.3 % (ref 39.0–52.0)
Hemoglobin: 16.7 g/dL (ref 13.0–17.0)
Immature Granulocytes: 1 %
Lymphocytes Relative: 10 %
Lymphs Abs: 1.1 10*3/uL (ref 0.7–4.0)
MCH: 35.8 pg — ABNORMAL HIGH (ref 26.0–34.0)
MCHC: 35.3 g/dL (ref 30.0–36.0)
MCV: 101.3 fL — ABNORMAL HIGH (ref 80.0–100.0)
Monocytes Absolute: 0.9 10*3/uL (ref 0.1–1.0)
Monocytes Relative: 8 %
Neutro Abs: 8.5 10*3/uL — ABNORMAL HIGH (ref 1.7–7.7)
Neutrophils Relative %: 77 %
Platelets: 175 10*3/uL (ref 150–400)
RBC: 4.67 MIL/uL (ref 4.22–5.81)
RDW: 12.5 % (ref 11.5–15.5)
WBC: 11 10*3/uL — ABNORMAL HIGH (ref 4.0–10.5)
nRBC: 0 % (ref 0.0–0.2)

## 2020-03-03 LAB — MAGNESIUM: Magnesium: 1.6 mg/dL — ABNORMAL LOW (ref 1.7–2.4)

## 2020-03-03 MED ORDER — SODIUM CHLORIDE 0.9 % IV SOLN
10.0000 mg/kg | Freq: Once | INTRAVENOUS | Status: AC
  Administered 2020-03-03: 620 mg via INTRAVENOUS
  Filled 2020-03-03: qty 10

## 2020-03-03 MED ORDER — MAGNESIUM SULFATE 2 GM/50ML IV SOLN
2.0000 g | Freq: Once | INTRAVENOUS | Status: AC
  Administered 2020-03-03: 2 g via INTRAVENOUS
  Filled 2020-03-03: qty 50

## 2020-03-03 MED ORDER — SODIUM CHLORIDE 0.9% FLUSH
10.0000 mL | INTRAVENOUS | Status: DC | PRN
  Administered 2020-03-03 (×2): 10 mL via INTRAVENOUS
  Filled 2020-03-03: qty 10

## 2020-03-03 MED ORDER — HEPARIN SOD (PORK) LOCK FLUSH 100 UNIT/ML IV SOLN
500.0000 [IU] | Freq: Once | INTRAVENOUS | Status: AC
  Administered 2020-03-03: 500 [IU] via INTRAVENOUS
  Filled 2020-03-03: qty 5

## 2020-03-03 MED ORDER — SODIUM CHLORIDE 0.9 % IV SOLN
Freq: Once | INTRAVENOUS | Status: AC
  Filled 2020-03-03: qty 250

## 2020-03-03 MED ORDER — HEPARIN SOD (PORK) LOCK FLUSH 100 UNIT/ML IV SOLN
INTRAVENOUS | Status: AC
  Filled 2020-03-03: qty 5

## 2020-03-03 NOTE — Progress Notes (Signed)
Hematology/Oncology Follow up note [facility] [phone] [phone]   Patient Care Team: Tracie Harrier, MD as PCP - General (Internal Medicine) Earlie Server, MD as Consulting Physician (Hematology and Oncology)  REASON FOR VISIT:  Follow up for treatment of squamous lung cancer.   HISTORY OF PRESENTING ILLNESS:  # Dec 2019 Stage I squamous lung cancer #s/p Bronchoscopy on 06/25/2018. subcarina EBUS FNA was non diagnostic, hypocellular specimen.  # 08/13/2018 CT guided right upper lobe biopsy pathology showed dense fibrosis and mixed inflammatory cells with prominent polytypic plasma cells component. Focal benign bronchial wall and alveolar spaces. No malignancy was identified.   # 08/13/2018 underwent right thoracotomy and wedge resection of a right upper lobe mass.  Frozen section was consistent with an inflammatory process.  On the second postop day, preliminary pathology reports high-grade malignancy.  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  08/20/2018 Patient was therefore taken back to the OR and underwent take complete lobectomy  pT1b pN0 cM0 stage I squamous cell lung cancer.  Margin is negative.  Recommend observation.    # 12/03/2018 CT chest w contrast showed local recurrence.  12/10/2018 PET showed No evidence of distant metastatic disease # 12/26/2018 s/p bronchoscopy biopsy. Confirmed local recurrence of squamous cell lung cancer.  medi port placed by Dr.Oaks.  Cancer treatment Started concurrent chemoradiation on 01/16/2019 Carboplatin AUC of 2 and Taxol 45 mg/m2 weekly finished in 03/05/2019 04/10/2019, patient started on durvalumab maintenance.  INTERVAL HISTORY TOD Samuel Choi is a 54 y.o. male who has above history reviewed by me today presents for evaluation prior to immunotherapy for treatment of  recurrent  squamous cell lung cancer. Cough is better. He forgets to take his magnesium supplementation this morning.  No new  complaints.  Review of Systems  Constitutional: Negative for appetite change, chills, diaphoresis, fatigue, fever and unexpected weight change.  HENT:   Negative for hearing loss, lump/mass, nosebleeds, sore throat and voice change.   Eyes: Negative for eye problems and icterus.  Respiratory: Negative for chest tightness, cough, hemoptysis, shortness of breath and wheezing.   Cardiovascular: Negative for chest pain and leg swelling.  Gastrointestinal: Negative for abdominal distention, abdominal pain, blood in stool, diarrhea, nausea and rectal pain.  Endocrine: Negative for hot flashes.  Genitourinary: Negative for bladder incontinence, difficulty urinating, dysuria, frequency, hematuria and nocturia.   Musculoskeletal: Negative for arthralgias, back pain, flank pain, gait problem and myalgias.  Skin: Negative for itching and rash.  Neurological: Negative for dizziness, gait problem, headaches, light-headedness, numbness and seizures.  Hematological: Negative for adenopathy. Does not bruise/bleed easily.  Psychiatric/Behavioral: Negative for confusion and decreased concentration. The patient is not nervous/anxious.     MEDICAL HISTORY:  Past Medical History:  Diagnosis Date  . Alcohol abuse    usually drinks 2-3 drinks per day  . Atherosclerosis 06/2018  . Chronic sinusitis   . Dehydration 02/07/2019  . Emphysema of lung ([place]) 06/2018   patient unaware of this.  . Hip fracture ([name]) 05/2018   no surgery  . History of kidney stones 05/2018   per xray, bilateral nephrolitiasis  . Hypertension   . Squamous cell carcinoma of lung, right ([name]) 06/2018    SURGICAL HISTORY: Past Surgical History:  Procedure Laterality Date  . BRAIN SURGERY  10/2017   nasal/sinus endoscopy. mass benign  . ELECTROMAGNETIC NAVIGATION BROCHOSCOPY Right 06/25/2018   Procedure: ELECTROMAGNETIC NAVIGATION BRONCHOSCOPY;  Surgeon: [name], MD;  Location: [facility] ORS;  Service: Cardiopulmonary;   Laterality:  Right;  . ENDOBRONCHIAL ULTRASOUND Right 06/25/2018   Procedure: ENDOBRONCHIAL ULTRASOUND;  Surgeon: [name], MD;  Location: [facility];  Service: Cardiopulmonary;  Laterality: Right;  . ENDOBRONCHIAL ULTRASOUND Right 12/26/2018   Procedure: ENDOBRONCHIAL ULTRASOUND RIGHT;  Surgeon: [name], MD;  Location: [facility] OR[facility];  Service: Cardiopulmonary;  Laterality: Right;  . FLEXIBLE BRONCHOSCOPY N/A 08/20/2018   Procedure: FLEXIBLE BRONCHOSCOPY PREOP;  Surgeon: [name], MD;  Location: [facility] ORS;  Service: Thoracic;  Laterality: N/A;  . IR CV LINE INJECTION  04/04/2019  . NASAL SINUS SURGERY  10/2017   At [facility], frontal sinusotomy, ethmoidectomy, resection anterior cranial fossa neoplasm, turbinate resection  . PORTACATH PLACEMENT Left 01/15/2019   Procedure: INSERTION PORT-A-CATH;  Surgeon: [name], MD;  Location: [facility] OR[facility];  Service: General;  Laterality: Left;  . THORACOTOMY Right 08/13/2018   Procedure: PREOP BROCHOSCOPY WITH RIGHT THORACOTOMY AND RUL RESECTION;  Surgeon: [name], MD;  Location: [facility];  Service: General;  Laterality: Right;  . THORACOTOMY Right 08/20/2018   Procedure: THORACOTOMY MAJOR RIGHT UPPER LOBE LOBECTOMY;  Surgeon: [name], MD;  Location: [facility] OR[facility];  Service: Thoracic;  Laterality: Right;  . TOE SURGERY Left    pin in left toe    SOCIAL HISTORY: Social History   Socioeconomic History  . Marital status: Married    Spouse name: lisa  . Number of children: Not on file  . Years of education: Not on file  . Highest education level: Not on file  Occupational History  . Occupation: welding    Comment: taking time off to resolve issues  Tobacco Use  . Smoking status: Former Smoker    Packs/day: 0.50    Years: 15.00    Pack years: 7.50    Types: Cigarettes    Quit date: 08/2018    Years since quitting: 1.5  . Smokeless tobacco: Never Used  Vaping Use  . Vaping Use: Never used  Substance and Sexual Activity  . Alcohol use: Yes     Alcohol/week: 3.0 standard drinks    Types: 3 Cans of beer per week    Comment: usually 2 drinks per week, per patient  . Drug use: No  . Sexual activity: Not on file  Other Topics Concern  . Not on file  Social History Narrative  . Not on file   Social Determinants of Health   Financial Resource Strain:   . Difficulty of Paying Living Expenses: Not on file  Food Insecurity:   . Worried About Charity fundraiser in the Last Year: Not on file  . Ran Out of Food in the Last Year: Not on file  Transportation Needs:   . Lack of Transportation (Medical): Not on file  . Lack of Transportation (Non-Medical): Not on file  Physical Activity:   . Days of Exercise per Week: Not on file  . Minutes of Exercise per Session: Not on file  Stress:   . Feeling of Stress : Not on file  Social Connections:   . Frequency of Communication with Friends and Family: Not on file  . Frequency of Social Gatherings with Friends and Family: Not on file  . Attends Religious Services: Not on file  . Active Member of Clubs or Organizations: Not on file  . Attends Archivist Meetings: Not on file  . Marital Status: Not on file  Intimate Partner Violence:   . Fear of Current or Ex-Partner: Not on file  . Emotionally Abused: Not on file  . Physically  Abused: Not on file  . Sexually Abused: Not on file    FAMILY HISTORY: Family History  Problem Relation Age of Onset  . Breast cancer Mother   . Diabetes Mother   . Lung cancer Father   . Hypertension Father     ALLERGIES:  has No Known Allergies.  MEDICATIONS:  Current Outpatient Medications  Medication Sig Dispense Refill  . albuterol (VENTOLIN HFA) 108 (90 Base) MCG/ACT inhaler Inhale 2 puffs into the lungs every 6 (six) hours as needed for wheezing or shortness of breath. 8 g 6  . cloNIDine (CATAPRES) 0.1 MG tablet Take 0.1 mg by mouth daily.   5  . diltiazem (CARDIZEM CD) 120 MG 24 hr capsule Take 120 mg by mouth daily.    . folic acid  (V-R FOLIC ACID) 606 MCG tablet Take 1 tablet (400 mcg total) by mouth daily. 90 tablet 1  . hydrALAZINE (APRESOLINE) 100 MG tablet Take 50 mg by mouth 2 (two) times daily.     . hydrochlorothiazide (HYDRODIURIL) 12.5 MG tablet Take 12.5 mg by mouth daily.     Marland Kitchen KLOR-CON M20 20 MEQ tablet TAKE 1 TABLET BY MOUTH TWICE A DAY 60 tablet 0  . latanoprost (XALATAN) 0.005 % ophthalmic solution 1 drop at bedtime.    . lidocaine-prilocaine (EMLA) cream Apply to affected area once 30 g 3  . magnesium chloride (SLOW-MAG) 64 MG TBEC SR tablet Take 1 tablet (64 mg total) by mouth 2 (two) times daily. TAKE 1 TABLET (64 MG TOTAL) BY MOUTH DAILY. 60 tablet 1  . methocarbamol (ROBAXIN) 500 MG tablet Take 1 tablet (500 mg total) by mouth at bedtime. 30 tablet 0  . olmesartan (BENICAR) 40 MG tablet Take 40 mg by mouth every other day.     . ondansetron (ZOFRAN) 8 MG tablet Take 1 tablet (8 mg total) by mouth 2 (two) times daily as needed for refractory nausea / vomiting. Start on day 3 after chemo. 30 tablet 1  . prochlorperazine (COMPAZINE) 10 MG tablet Take 1 tablet (10 mg total) by mouth every 6 (six) hours as needed (Nausea or vomiting). 30 tablet 1  . vitamin B-12 (CYANOCOBALAMIN) 1000 MCG tablet Take 1 tablet (1,000 mcg total) by mouth daily. 90 tablet 1   No current facility-administered medications for this visit.   Facility-Administered Medications Ordered in Other Visits  Medication Dose Route Frequency Provider Last Rate Last Admin  . albuterol (PROVENTIL) (2.5 MG/3ML) 0.083% nebulizer solution 2.5 mg  2.5 mg Nebulization Once System, Provider Not In      . heparin lock flush 100 unit/mL  500 Units Intravenous Once Earlie Server, MD      . heparin lock flush 100 unit/mL  500 Units Intravenous Once Earlie Server, MD      . sodium chloride flush (NS) 0.9 % injection 10 mL  10 mL Intravenous PRN Earlie Server, MD   10 mL at 04/04/19 0903  . sodium chloride flush (NS) 0.9 % injection 10 mL  10 mL Intravenous PRN Earlie Server,  MD         PHYSICAL EXAMINATION: ECOG PERFORMANCE STATUS: 0 - Asymptomatic Vitals:   03/03/20 0902  BP: 126/81  Pulse: (!) 106  Resp: 18  Temp: 97.9 F (36.6 C)   Filed Weights   03/03/20 0902  Weight: 141 lb 9.6 oz (64.2 kg)    Physical Exam Constitutional:      General: He is not in acute distress. HENT:     Head:  Normocephalic and atraumatic.  Eyes:     General: No scleral icterus. Cardiovascular:     Rate and Rhythm: Normal rate and regular rhythm.     Heart sounds: Normal heart sounds.  Pulmonary:     Effort: Pulmonary effort is normal. No respiratory distress.     Breath sounds: No wheezing.  Abdominal:     General: Bowel sounds are normal. There is no distension.     Palpations: Abdomen is soft.  Musculoskeletal:        General: No deformity. Normal range of motion.     Cervical back: Normal range of motion and neck supple.  Skin:    General: Skin is warm and dry.     Findings: No erythema or rash.  Neurological:     General: No focal deficit present.     Mental Status: He is alert and oriented to person, place, and time. Mental status is at baseline.     Cranial Nerves: No cranial nerve deficit.     Coordination: Coordination normal.  Psychiatric:        Mood and Affect: Mood normal.      LABORATORY DATA:  I have reviewed the data as listed Lab Results  Component Value Date   WBC 10.1 02/18/2020   HGB 16.4 02/18/2020   HCT 46.7 02/18/2020   MCV 101.7 (H) 02/18/2020   PLT 150 02/18/2020   Recent Labs    01/21/20 0818 02/04/20 0814 02/18/20 0817  NA 137 138 137  K 3.7 3.3* 3.8  CL 104 105 102  CO2 _0 GLUCOSE 137* 120* 114*  BUN _1 CREATININE 0.95 1.01 0.91  CALCIUM 9.0 8.8* 9.2  GFRNONAA >60 >60 >60  GFRAA >60 >60 >60  PROT 7.3 7.1 7.8  ALBUMIN 3.4* 3.3* 3.7  AST 33 23 31  ALT _2 ALKPHOS 84 86 103  BILITOT 0.5 0.8 1.1   Iron/TIBC/Ferritin/ %Sat No results found for: IRON, TIBC, FERRITIN, IRONPCTSAT    RADIOGRAPHIC STUDIES: I have personally reviewed the radiological images as listed and agreed with the findings in the report. DG Chest 2 View  Result Date: 02/04/2020 CLINICAL DATA:  Cough, history of lung cancer EXAM: CHEST - 2 VIEW COMPARISON:  01/15/2019 FINDINGS: Post right upper lobectomy. Interstitial thickening is present likely reflecting scarring/radiation change. Trace right pleural effusion remains present. Stable cardiomediastinal contours with normal heart size. Left chest wall port catheter tip overlies SVC. No acute osseous abnormality. IMPRESSION: Probable postoperative and radiation changes. Trace right pleural effusion. Electronically Signed   By: Macy Mis M.D.   On: 02/04/2020 16:58   CT CHEST W CONTRAST  Result Date: 02/06/2020 CLINICAL DATA:  Lung cancer treated with surgery, chemotherapy and radiation therapy. EXAM: CT CHEST WITH CONTRAST TECHNIQUE: Multidetector CT imaging of the chest was performed during intravenous contrast administration. CONTRAST:  56m OMNIPAQUE IOHEXOL 300 MG/ML  SOLN COMPARISON:  11/21/2019. FINDINGS: Cardiovascular: Left IJ Port-A-Cath terminates in the SVC. Atherosclerotic calcification of the aorta and coronary arteries. Pulmonic trunk is enlarged. Heart size normal. Small amount of pericardial fluid appears similar. Mediastinum/Nodes: Subcentimeter low-attenuation nodules in the thyroid. No follow-up recommended (ref: J Am Coll Radiol. 2015 Feb;12(2): 143-50).Low internal jugular lymph nodes are not enlarged by CT size criteria. No pathologically enlarged mediastinal, left hilar or axillary lymph nodes. Evaluation of the right hilum is somewhat limited due to soft tissue thickening and postoperative changes. Esophagus is grossly unremarkable. Lungs/Pleura: Right upper lobectomy with associated  pleuroparenchymal scarring and post radiation architectural distortion in the medial right hemithorax. Peribronchovascular nodularity in the right lower  lobe is new or increased from the prior exam. In addition to pre-existing stable pulmonary nodules, there are new bilateral pulmonary nodules measuring up to 5 mm in the medial right lower lobe (3/73). Centrilobular emphysema. Tiny right pleural effusion with associated pleural thickening, stable. Airway is otherwise unremarkable. Upper Abdomen: Visualized portions of the liver, gallbladder and adrenal glands are unremarkable. Stones are seen in the kidneys bilaterally. Renal cortical scarring on the right. Subcentimeter low-attenuation lesion in the left kidney is partially imaged. Visualized portions of the kidneys, spleen, pancreas, stomach and bowel are otherwise unremarkable. No upper abdominal adenopathy. Musculoskeletal: Degenerative changes in the spine. No worrisome lytic or sclerotic lesions. IMPRESSION: 1. Postoperative and post radiation changes in the right lung. Scattered new bilateral pulmonary nodules are worrisome for metastatic disease. 2. Chronic small right fibrothorax. 3. Bilateral renal stones. 4. Aortic atherosclerosis (ICD10-I70.0). Coronary artery calcification. 5. Enlarged pulmonic trunk, indicative of pulmonary arterial hypertension. 6.  Emphysema (ICD10-J43.9). Electronically Signed   By: Lorin Picket M.D.   On: 02/06/2020 14:03     ASSESSMENT & PLAN:  1. Squamous cell carcinoma of lung, right ([place])   2. Encounter for antineoplastic immunotherapy   3. Hypomagnesemia   4. Macrocytosis without anemia   5. Tachycardia    #Recurrent Squamous cell carcinoma of lung,  Currently on maintenance immunotherapy with durvalumab every 2 weeks Labs are reviewed with patient.  Counts acceptable to proceed with treatment today. CT in July showed a few small new lung nodules which need to have a short-term CT for follow-up.  Plan October 2020 1 repeat CT scan.  #Chronic hypomagnesia.  Continue slow magnesium twice daily. Today's magnesium is 1.6.  Proceed with magnesium IV 2 g x  1. #Chronic hypokalemia, potassium level has been stable and normal.  Continue maintenance potassium chloride 20 mEq daily #Mediport has sluggish blood return but flushes without difficulty. I will proceed with immunotherapy today.  Patient will have port accessed at the next visit.  If persistently having issues with blood return, will send patient for dye study.  Return of visit: 2 weeks.    Earlie Server, MD, PhD Hematology Oncology [facility] 03/03/2020

## 2020-03-03 NOTE — Progress Notes (Signed)
Patient denies new problems/concerns today.   °

## 2020-03-03 NOTE — Progress Notes (Signed)
Sluggish blood return noted from port-a-cath today. Port-a-cath flushes without difficulty. No swelling, redness, or pain noted at port site. MD, Dr. Tasia Catchings, notified and aware. Per MD order: port-a-cath can be used for Imfinzi treatment today; proceed with Imfinzi treatment at this time.  Magnesium: 1.6 today. MD, Dr. Tasia Catchings, notified and aware. Per MD order: administer Magnesium Sulfate 2g IVPB over 60 minutes once; order present in supportive therapy plan.

## 2020-03-17 ENCOUNTER — Inpatient Hospital Stay: Payer: Managed Care, Other (non HMO)

## 2020-03-17 ENCOUNTER — Inpatient Hospital Stay (HOSPITAL_BASED_OUTPATIENT_CLINIC_OR_DEPARTMENT_OTHER): Payer: Managed Care, Other (non HMO) | Admitting: Oncology

## 2020-03-17 ENCOUNTER — Other Ambulatory Visit: Payer: Self-pay

## 2020-03-17 ENCOUNTER — Inpatient Hospital Stay: Payer: Managed Care, Other (non HMO) | Attending: Oncology

## 2020-03-17 ENCOUNTER — Encounter: Payer: Self-pay | Admitting: Oncology

## 2020-03-17 VITALS — BP 122/70 | HR 82 | Resp 16

## 2020-03-17 VITALS — BP 120/85 | HR 89 | Temp 96.2°F | Resp 16 | Wt 139.0 lb

## 2020-03-17 DIAGNOSIS — Z833 Family history of diabetes mellitus: Secondary | ICD-10-CM | POA: Insufficient documentation

## 2020-03-17 DIAGNOSIS — Z9221 Personal history of antineoplastic chemotherapy: Secondary | ICD-10-CM | POA: Diagnosis not present

## 2020-03-17 DIAGNOSIS — C3411 Malignant neoplasm of upper lobe, right bronchus or lung: Secondary | ICD-10-CM | POA: Insufficient documentation

## 2020-03-17 DIAGNOSIS — I10 Essential (primary) hypertension: Secondary | ICD-10-CM | POA: Insufficient documentation

## 2020-03-17 DIAGNOSIS — E876 Hypokalemia: Secondary | ICD-10-CM

## 2020-03-17 DIAGNOSIS — C3491 Malignant neoplasm of unspecified part of right bronchus or lung: Secondary | ICD-10-CM | POA: Diagnosis not present

## 2020-03-17 DIAGNOSIS — Z803 Family history of malignant neoplasm of breast: Secondary | ICD-10-CM | POA: Insufficient documentation

## 2020-03-17 DIAGNOSIS — Z87891 Personal history of nicotine dependence: Secondary | ICD-10-CM | POA: Insufficient documentation

## 2020-03-17 DIAGNOSIS — Z8249 Family history of ischemic heart disease and other diseases of the circulatory system: Secondary | ICD-10-CM | POA: Insufficient documentation

## 2020-03-17 DIAGNOSIS — Z5112 Encounter for antineoplastic immunotherapy: Secondary | ICD-10-CM | POA: Insufficient documentation

## 2020-03-17 DIAGNOSIS — Z923 Personal history of irradiation: Secondary | ICD-10-CM | POA: Diagnosis not present

## 2020-03-17 DIAGNOSIS — Z79899 Other long term (current) drug therapy: Secondary | ICD-10-CM | POA: Insufficient documentation

## 2020-03-17 LAB — CBC WITH DIFFERENTIAL/PLATELET
Abs Immature Granulocytes: 0.04 10*3/uL (ref 0.00–0.07)
Basophils Absolute: 0.1 10*3/uL (ref 0.0–0.1)
Basophils Relative: 1 %
Eosinophils Absolute: 0.4 10*3/uL (ref 0.0–0.5)
Eosinophils Relative: 3 %
HCT: 45.9 % (ref 39.0–52.0)
Hemoglobin: 16.2 g/dL (ref 13.0–17.0)
Immature Granulocytes: 0 %
Lymphocytes Relative: 8 %
Lymphs Abs: 1 10*3/uL (ref 0.7–4.0)
MCH: 35.5 pg — ABNORMAL HIGH (ref 26.0–34.0)
MCHC: 35.3 g/dL (ref 30.0–36.0)
MCV: 100.7 fL — ABNORMAL HIGH (ref 80.0–100.0)
Monocytes Absolute: 0.9 10*3/uL (ref 0.1–1.0)
Monocytes Relative: 8 %
Neutro Abs: 9.5 10*3/uL — ABNORMAL HIGH (ref 1.7–7.7)
Neutrophils Relative %: 80 %
Platelets: 148 10*3/uL — ABNORMAL LOW (ref 150–400)
RBC: 4.56 MIL/uL (ref 4.22–5.81)
RDW: 12.3 % (ref 11.5–15.5)
WBC: 11.9 10*3/uL — ABNORMAL HIGH (ref 4.0–10.5)
nRBC: 0 % (ref 0.0–0.2)

## 2020-03-17 LAB — COMPREHENSIVE METABOLIC PANEL
ALT: 25 U/L (ref 0–44)
AST: 32 U/L (ref 15–41)
Albumin: 3.5 g/dL (ref 3.5–5.0)
Alkaline Phosphatase: 86 U/L (ref 38–126)
Anion gap: 13 (ref 5–15)
BUN: 14 mg/dL (ref 6–20)
CO2: 20 mmol/L — ABNORMAL LOW (ref 22–32)
Calcium: 9 mg/dL (ref 8.9–10.3)
Chloride: 101 mmol/L (ref 98–111)
Creatinine, Ser: 1.19 mg/dL (ref 0.61–1.24)
GFR calc Af Amer: >60 mL/min (ref >60)
GFR calc non Af Amer: >60 mL/min (ref >60)
Glucose, Bld: 167 mg/dL — ABNORMAL HIGH (ref 70–99)
Potassium: 4.6 mmol/L (ref 3.5–5.1)
Sodium: 134 mmol/L — ABNORMAL LOW (ref 135–145)
Total Bilirubin: 0.8 mg/dL (ref 0.3–1.2)
Total Protein: 7.7 g/dL (ref 6.5–8.1)

## 2020-03-17 LAB — MAGNESIUM: Magnesium: 1.8 mg/dL (ref 1.7–2.4)

## 2020-03-17 MED ORDER — HEPARIN SOD (PORK) LOCK FLUSH 100 UNIT/ML IV SOLN
500.0000 [IU] | Freq: Once | INTRAVENOUS | Status: AC
  Administered 2020-03-17: 500 [IU] via INTRAVENOUS
  Filled 2020-03-17: qty 5

## 2020-03-17 MED ORDER — SODIUM CHLORIDE 0.9 % IV SOLN
Freq: Once | INTRAVENOUS | Status: AC
  Filled 2020-03-17: qty 250

## 2020-03-17 MED ORDER — SODIUM CHLORIDE 0.9 % IV SOLN
10.0000 mg/kg | Freq: Once | INTRAVENOUS | Status: AC
  Administered 2020-03-17: 620 mg via INTRAVENOUS
  Filled 2020-03-17: qty 10

## 2020-03-17 MED ORDER — HEPARIN SOD (PORK) LOCK FLUSH 100 UNIT/ML IV SOLN
INTRAVENOUS | Status: AC
  Filled 2020-03-17: qty 5

## 2020-03-17 MED ORDER — SODIUM CHLORIDE 0.9% FLUSH
10.0000 mL | Freq: Once | INTRAVENOUS | Status: AC
  Administered 2020-03-17: 10 mL via INTRAVENOUS
  Filled 2020-03-17: qty 10

## 2020-03-17 NOTE — Progress Notes (Signed)
With Port access patient had blood return. Upon returning to infusion suite for treatment, unable to obtain blood return. Dr. Tasia Catchings and team made aware. Okay to proceed with treatment per Benjamine Mola, RN  Per Benjamine Mola, RN, Patient is to stay accessed for Dye Study tomorrow, 03/18/2020.

## 2020-03-17 NOTE — Progress Notes (Signed)
Patient denies new problems/concerns today.   °

## 2020-03-17 NOTE — Progress Notes (Signed)
Hematology/Oncology Follow up note [facility] Telephone:([phone]) [phone] Fax:([phone]) [phone]   Patient Care Team: Tracie Harrier, MD as PCP - General (Internal Medicine) Earlie Server, MD as Consulting Physician (Hematology and Oncology)  REASON FOR VISIT:  Follow up for treatment of squamous lung cancer.   HISTORY OF PRESENTING ILLNESS:  # Dec 2019 Stage I squamous lung cancer #s/p Bronchoscopy on 06/25/2018. subcarina EBUS FNA was non diagnostic, hypocellular specimen.  # 08/13/2018 CT guided right upper lobe biopsy pathology showed dense fibrosis and mixed inflammatory cells with prominent polytypic plasma cells component. Focal benign bronchial wall and alveolar spaces. No malignancy was identified.   # 08/13/2018 underwent right thoracotomy and wedge resection of a right upper lobe mass.  Frozen section was consistent with an inflammatory process.  On the second postop day, preliminary pathology reports high-grade malignancy.  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  08/20/2018 Patient was therefore taken back to the OR and underwent take complete lobectomy  pT1b pN0 cM0 stage I squamous cell lung cancer.  Margin is negative.  Recommend observation.    # 12/03/2018 CT chest w contrast showed local recurrence.  12/10/2018 PET showed No evidence of distant metastatic disease # 12/26/2018 s/p bronchoscopy biopsy. Confirmed local recurrence of squamous cell lung cancer.  medi port placed by Dr.Oaks.  Cancer treatment Started concurrent chemoradiation on 01/16/2019 Carboplatin AUC of 2 and Taxol 45 mg/m2 weekly finished in 03/05/2019 04/10/2019, patient started on durvalumab maintenance.  INTERVAL HISTORY Samuel Choi is a 54 y.o. male who has above history reviewed by me today presents for evaluation prior to immunotherapy for treatment of  recurrent  squamous cell lung cancer. Cough has resolved.  Patient reports being compliant taking his magnesium  supplementation.  No new complaints.  Review of Systems  Constitutional: Negative for appetite change, chills, diaphoresis, fatigue, fever and unexpected weight change.  HENT:   Negative for hearing loss, lump/mass, nosebleeds, sore throat and voice change.   Eyes: Negative for eye problems and icterus.  Respiratory: Negative for chest tightness, cough, hemoptysis, shortness of breath and wheezing.   Cardiovascular: Negative for chest pain and leg swelling.  Gastrointestinal: Negative for abdominal distention, abdominal pain, blood in stool, diarrhea, nausea and rectal pain.  Endocrine: Negative for hot flashes.  Genitourinary: Negative for bladder incontinence, difficulty urinating, dysuria, frequency, hematuria and nocturia.   Musculoskeletal: Negative for arthralgias, back pain, flank pain, gait problem and myalgias.  Skin: Negative for itching and rash.  Neurological: Negative for dizziness, gait problem, headaches, light-headedness, numbness and seizures.  Hematological: Negative for adenopathy. Does not bruise/bleed easily.  Psychiatric/Behavioral: Negative for confusion and decreased concentration. The patient is not nervous/anxious.     MEDICAL HISTORY:  Past Medical History:  Diagnosis Date  . Alcohol abuse    usually drinks 2-3 drinks per day  . Atherosclerosis 06/2018  . Chronic sinusitis   . Dehydration 02/07/2019  . Emphysema of lung ([place]) 06/2018   patient unaware of this.  . Hip fracture ([place]) 05/2018   no surgery  . History of kidney stones 05/2018   per xray, bilateral nephrolitiasis  . Hypertension   . Squamous cell carcinoma of lung, right ([place]) 06/2018    SURGICAL HISTORY: Past Surgical History:  Procedure Laterality Date  . BRAIN SURGERY  10/2017   nasal/sinus endoscopy. mass benign  . ELECTROMAGNETIC NAVIGATION BROCHOSCOPY Right 06/25/2018   Procedure: ELECTROMAGNETIC NAVIGATION BRONCHOSCOPY;  Surgeon: [name], MD;  Location: [facility] ORS;  Service:  Cardiopulmonary;  Laterality:  Right;  . ENDOBRONCHIAL ULTRASOUND Right 06/25/2018   Procedure: ENDOBRONCHIAL ULTRASOUND;  Surgeon: [name], MD;  Location: [facility] ORS;  Service: Cardiopulmonary;  Laterality: Right;  . ENDOBRONCHIAL ULTRASOUND Right 12/26/2018   Procedure: ENDOBRONCHIAL ULTRASOUND RIGHT;  Surgeon: [name], MD;  Location: [facility] ORS;  Service: Cardiopulmonary;  Laterality: Right;  . FLEXIBLE BRONCHOSCOPY N/A 08/20/2018   Procedure: FLEXIBLE BRONCHOSCOPY PREOP;  Surgeon: [name], MD;  Location: [facility] ORS;  Service: Thoracic;  Laterality: N/A;  . IR CV LINE INJECTION  04/04/2019  . NASAL SINUS SURGERY  10/2017   At [facility], frontal sinusotomy, ethmoidectomy, resection anterior cranial fossa neoplasm, turbinate resection  . PORTACATH PLACEMENT Left 01/15/2019   Procedure: INSERTION PORT-A-CATH;  Surgeon: [name], MD;  Location: [facility] ORS;  Service: General;  Laterality: Left;  . THORACOTOMY Right 08/13/2018   Procedure: PREOP BROCHOSCOPY WITH RIGHT THORACOTOMY AND RUL RESECTION;  Surgeon: [name], MD;  Location: [facility] OR[facility];  Service: General;  Laterality: Right;  . THORACOTOMY Right 08/20/2018   Procedure: THORACOTOMY MAJOR RIGHT UPPER LOBE LOBECTOMY;  Surgeon: [name], MD;  Location: [facility] OR[facility];  Service: Thoracic;  Laterality: Right;  . TOE SURGERY Left    pin in left toe    SOCIAL HISTORY: Social History   Socioeconomic History  . Marital status: Married    Spouse name: lisa  . Number of children: Not on file  . Years of education: Not on file  . Highest education level: Not on file  Occupational History  . Occupation: welding    Comment: taking time off to resolve issues  Tobacco Use  . Smoking status: Former Smoker    Packs/day: 0.50    Years: 15.00    Pack years: 7.50    Types: Cigarettes    Quit date: 08/2018    Years since quitting: 1.6  . Smokeless tobacco: Never Used  Vaping Use  . Vaping Use: Never used  Substance and Sexual Activity  .  Alcohol use: Yes    Alcohol/week: 3.0 standard drinks    Types: 3 Cans of beer per week    Comment: usually 2 drinks per week, per patient  . Drug use: No  . Sexual activity: Not on file  Other Topics Concern  . Not on file  Social History Narrative  . Not on file   Social Determinants of Health   Financial Resource Strain:   . Difficulty of Paying Living Expenses: Not on file  Food Insecurity:   . Worried About Charity fundraiser in the Last Year: Not on file  . Ran Out of Food in the Last Year: Not on file  Transportation Needs:   . Lack of Transportation (Medical): Not on file  . Lack of Transportation (Non-Medical): Not on file  Physical Activity:   . Days of Exercise per Week: Not on file  . Minutes of Exercise per Session: Not on file  Stress:   . Feeling of Stress : Not on file  Social Connections:   . Frequency of Communication with Friends and Family: Not on file  . Frequency of Social Gatherings with Friends and Family: Not on file  . Attends Religious Services: Not on file  . Active Member of Clubs or Organizations: Not on file  . Attends Archivist Meetings: Not on file  . Marital Status: Not on file  Intimate Partner Violence:   . Fear of Current or Ex-Partner: Not on file  . Emotionally Abused: Not on file  . Physically  Abused: Not on file  . Sexually Abused: Not on file    FAMILY HISTORY: Family History  Problem Relation Age of Onset  . Breast cancer Mother   . Diabetes Mother   . Lung cancer Father   . Hypertension Father     ALLERGIES:  has No Known Allergies.  MEDICATIONS:  Current Outpatient Medications  Medication Sig Dispense Refill  . albuterol (VENTOLIN HFA) 108 (90 Base) MCG/ACT inhaler Inhale 2 puffs into the lungs every 6 (six) hours as needed for wheezing or shortness of breath. 8 g 6  . cloNIDine (CATAPRES) 0.1 MG tablet Take 0.1 mg by mouth daily.   5  . diltiazem (CARDIZEM CD) 120 MG 24 hr capsule Take 120 mg by mouth  daily.    . folic acid (V-R FOLIC ACID) 366 MCG tablet Take 1 tablet (400 mcg total) by mouth daily. 90 tablet 1  . hydrALAZINE (APRESOLINE) 100 MG tablet Take 50 mg by mouth 2 (two) times daily.     . hydrochlorothiazide (HYDRODIURIL) 12.5 MG tablet Take 12.5 mg by mouth daily.     Marland Kitchen KLOR-CON M20 20 MEQ tablet TAKE 1 TABLET BY MOUTH TWICE A DAY 60 tablet 0  . latanoprost (XALATAN) 0.005 % ophthalmic solution 1 drop at bedtime.    . lidocaine-prilocaine (EMLA) cream Apply to affected area once 30 g 3  . magnesium chloride (SLOW-MAG) 64 MG TBEC SR tablet Take 1 tablet (64 mg total) by mouth 2 (two) times daily. TAKE 1 TABLET (64 MG TOTAL) BY MOUTH DAILY. 60 tablet 1  . methocarbamol (ROBAXIN) 500 MG tablet Take 1 tablet (500 mg total) by mouth at bedtime. 30 tablet 0  . olmesartan (BENICAR) 40 MG tablet Take 40 mg by mouth every other day.     . ondansetron (ZOFRAN) 8 MG tablet Take 1 tablet (8 mg total) by mouth 2 (two) times daily as needed for refractory nausea / vomiting. Start on day 3 after chemo. 30 tablet 1  . prochlorperazine (COMPAZINE) 10 MG tablet Take 1 tablet (10 mg total) by mouth every 6 (six) hours as needed (Nausea or vomiting). 30 tablet 1  . vitamin B-12 (CYANOCOBALAMIN) 1000 MCG tablet Take 1 tablet (1,000 mcg total) by mouth daily. 90 tablet 1   No current facility-administered medications for this visit.   Facility-Administered Medications Ordered in Other Visits  Medication Dose Route Frequency Provider Last Rate Last Admin  . albuterol (PROVENTIL) (2.5 MG/3ML) 0.083% nebulizer solution 2.5 mg  2.5 mg Nebulization Once System, Provider Not In      . heparin lock flush 100 unit/mL  500 Units Intravenous Once Earlie Server, MD      . sodium chloride flush (NS) 0.9 % injection 10 mL  10 mL Intravenous PRN Earlie Server, MD   10 mL at 04/04/19 0903     PHYSICAL EXAMINATION: ECOG PERFORMANCE STATUS: 0 - Asymptomatic Vitals:   03/17/20 0853  BP: 120/85  Pulse: 89  Resp: 16    Temp: (!) 96.2 F (35.7 C)   Filed Weights   03/17/20 0853  Weight: 139 lb (63 kg)    Physical Exam Constitutional:      General: He is not in acute distress. HENT:     Head: Normocephalic and atraumatic.  Eyes:     General: No scleral icterus. Cardiovascular:     Rate and Rhythm: Normal rate and regular rhythm.     Heart sounds: Normal heart sounds.  Pulmonary:     Effort: Pulmonary  effort is normal. No respiratory distress.     Breath sounds: No wheezing.  Abdominal:     General: Bowel sounds are normal. There is no distension.     Palpations: Abdomen is soft.  Musculoskeletal:        General: No deformity. Normal range of motion.     Cervical back: Normal range of motion and neck supple.  Skin:    General: Skin is warm and dry.     Findings: No erythema or rash.  Neurological:     General: No focal deficit present.     Mental Status: He is alert and oriented to person, place, and time. Mental status is at baseline.     Cranial Nerves: No cranial nerve deficit.     Coordination: Coordination normal.  Psychiatric:        Mood and Affect: Mood normal.      LABORATORY DATA:  I have reviewed the data as listed Lab Results  Component Value Date   WBC 11.9 (H) 03/17/2020   HGB 16.2 03/17/2020   HCT 45.9 03/17/2020   MCV 100.7 (H) 03/17/2020   PLT 148 (L) 03/17/2020   Recent Labs    02/18/20 0817 03/03/20 0830 03/17/20 0830  NA 137 141 134*  K 3.8 3.9 4.6  CL 102 105 101  CO2 24 23 20*  GLUCOSE 114* 116* 167*  BUN _0 CREATININE 0.91 1.04 1.19  CALCIUM 9.2 9.4 9.0  GFRNONAA >60 >60 >60  GFRAA >60 >60 >60  PROT 7.8 7.7 7.7  ALBUMIN 3.7 3.6 3.5  AST 31 31 32  ALT _1 ALKPHOS 103 97 86  BILITOT 1.1 0.4 0.8   Iron/TIBC/Ferritin/ %Sat No results found for: IRON, TIBC, FERRITIN, IRONPCTSAT   RADIOGRAPHIC STUDIES: I have personally reviewed the radiological images as listed and agreed with the findings in the report. DG Chest 2  View  Result Date: 02/04/2020 CLINICAL DATA:  Cough, history of lung cancer EXAM: CHEST - 2 VIEW COMPARISON:  01/15/2019 FINDINGS: Post right upper lobectomy. Interstitial thickening is present likely reflecting scarring/radiation change. Trace right pleural effusion remains present. Stable cardiomediastinal contours with normal heart size. Left chest wall port catheter tip overlies SVC. No acute osseous abnormality. IMPRESSION: Probable postoperative and radiation changes. Trace right pleural effusion. Electronically Signed   By: Macy Mis M.D.   On: 02/04/2020 16:58   CT CHEST W CONTRAST  Result Date: 02/06/2020 CLINICAL DATA:  Lung cancer treated with surgery, chemotherapy and radiation therapy. EXAM: CT CHEST WITH CONTRAST TECHNIQUE: Multidetector CT imaging of the chest was performed during intravenous contrast administration. CONTRAST:  36m OMNIPAQUE IOHEXOL 300 MG/ML  SOLN COMPARISON:  11/21/2019. FINDINGS: Cardiovascular: Left IJ Port-A-Cath terminates in the SVC. Atherosclerotic calcification of the aorta and coronary arteries. Pulmonic trunk is enlarged. Heart size normal. Small amount of pericardial fluid appears similar. Mediastinum/Nodes: Subcentimeter low-attenuation nodules in the thyroid. No follow-up recommended (ref: J Am Coll Radiol. 2015 Feb;12(2): 143-50).Low internal jugular lymph nodes are not enlarged by CT size criteria. No pathologically enlarged mediastinal, left hilar or axillary lymph nodes. Evaluation of the right hilum is somewhat limited due to soft tissue thickening and postoperative changes. Esophagus is grossly unremarkable. Lungs/Pleura: Right upper lobectomy with associated pleuroparenchymal scarring and post radiation architectural distortion in the medial right hemithorax. Peribronchovascular nodularity in the right lower lobe is new or increased from the prior exam. In addition to pre-existing stable pulmonary nodules, there are new bilateral pulmonary nodules  measuring up to 5 mm in the medial right lower lobe (3/73). Centrilobular emphysema. Tiny right pleural effusion with associated pleural thickening, stable. Airway is otherwise unremarkable. Upper Abdomen: Visualized portions of the liver, gallbladder and adrenal glands are unremarkable. Stones are seen in the kidneys bilaterally. Renal cortical scarring on the right. Subcentimeter low-attenuation lesion in the left kidney is partially imaged. Visualized portions of the kidneys, spleen, pancreas, stomach and bowel are otherwise unremarkable. No upper abdominal adenopathy. Musculoskeletal: Degenerative changes in the spine. No worrisome lytic or sclerotic lesions. IMPRESSION: 1. Postoperative and post radiation changes in the right lung. Scattered new bilateral pulmonary nodules are worrisome for metastatic disease. 2. Chronic small right fibrothorax. 3. Bilateral renal stones. 4. Aortic atherosclerosis (ICD10-I70.0). Coronary artery calcification. 5. Enlarged pulmonic trunk, indicative of pulmonary arterial hypertension. 6.  Emphysema (ICD10-J43.9). Electronically Signed   By: Lorin Picket M.D.   On: 02/06/2020 14:03     ASSESSMENT & PLAN:  1. Squamous cell carcinoma of lung, right ([place])   2. Encounter for antineoplastic immunotherapy   3. Hypomagnesemia   4. Hypokalemia    #Recurrent Squamous cell carcinoma of lung,  Currently on maintenance immunotherapy with durvalumab every 2 weeks Labs are reviewed and discussed with patient.  Counts acceptable to proceed with treatment today. Plan repeat CT scan in October 2021  #Port-A-Cath in place, no blood return but flushes well.  We will send patient to do the study. #Chronic hypomagnesia.  Continue slow magnesium twice daily.  #Chronic hypokalemia, continue oral potassium supplementation 20 mEq today.   Return of visit: 2 weeks.    Earlie Server, MD, PhD Hematology Oncology [facility] 03/17/2020

## 2020-03-18 ENCOUNTER — Other Ambulatory Visit: Payer: Self-pay

## 2020-03-18 ENCOUNTER — Inpatient Hospital Stay: Payer: Managed Care, Other (non HMO)

## 2020-03-18 ENCOUNTER — Ambulatory Visit
Admission: RE | Admit: 2020-03-18 | Discharge: 2020-03-18 | Disposition: A | Discharge status: Home / Self Care | Payer: Managed Care, Other (non HMO) | Source: Ambulatory Visit | Attending: Oncology | Admitting: Oncology

## 2020-03-18 DIAGNOSIS — C3491 Malignant neoplasm of unspecified part of right bronchus or lung: Secondary | ICD-10-CM | POA: Insufficient documentation

## 2020-03-18 MED ORDER — IOHEXOL 300 MG/ML  SOLN
25.0000 mL | Freq: Once | INTRAMUSCULAR | Status: AC | PRN
  Administered 2020-03-18: 25 mL

## 2020-03-18 MED ORDER — HEPARIN SOD (PORK) LOCK FLUSH 100 UNIT/ML IV SOLN
500.0000 [IU] | Freq: Once | INTRAVENOUS | Status: AC
  Administered 2020-03-18: 500 [IU] via INTRAVENOUS
  Filled 2020-03-18: qty 5

## 2020-03-18 MED ORDER — SODIUM CHLORIDE (PF) 0.9 % IJ SOLN
10.0000 mL | Freq: Once | INTRAMUSCULAR | Status: AC
  Administered 2020-03-18: 10 mL via INTRAVENOUS

## 2020-03-18 NOTE — Progress Notes (Signed)
Received call from Dr. Register stating that patient had a dye study today and needed to be de-accessed. Pt came to cancer center and was de accessed. Dressing clean, dry, intact.

## 2020-03-19 ENCOUNTER — Telehealth: Payer: Self-pay

## 2020-03-19 NOTE — Telephone Encounter (Signed)
Done... Infusion time changed.

## 2020-03-19 NOTE — Telephone Encounter (Signed)
-----   Message from Earlie Server, MD sent at 03/18/2020  9:58 PM EDT ----- Please let him get cath flo at next visit.

## 2020-03-19 NOTE — Telephone Encounter (Signed)
Please schedule time for cath flow at his next visit.

## 2020-03-31 ENCOUNTER — Inpatient Hospital Stay: Payer: Managed Care, Other (non HMO)

## 2020-03-31 ENCOUNTER — Inpatient Hospital Stay (HOSPITAL_BASED_OUTPATIENT_CLINIC_OR_DEPARTMENT_OTHER): Payer: Managed Care, Other (non HMO) | Admitting: Oncology

## 2020-03-31 ENCOUNTER — Encounter: Payer: Self-pay | Admitting: Oncology

## 2020-03-31 ENCOUNTER — Other Ambulatory Visit: Payer: Self-pay

## 2020-03-31 VITALS — BP 140/93 | HR 99 | Temp 97.3°F | Resp 18 | Wt 141.6 lb

## 2020-03-31 DIAGNOSIS — R062 Wheezing: Secondary | ICD-10-CM

## 2020-03-31 DIAGNOSIS — R05 Cough: Secondary | ICD-10-CM | POA: Diagnosis not present

## 2020-03-31 DIAGNOSIS — R059 Cough, unspecified: Secondary | ICD-10-CM | POA: Insufficient documentation

## 2020-03-31 DIAGNOSIS — C3491 Malignant neoplasm of unspecified part of right bronchus or lung: Secondary | ICD-10-CM | POA: Diagnosis not present

## 2020-03-31 DIAGNOSIS — Z5112 Encounter for antineoplastic immunotherapy: Secondary | ICD-10-CM | POA: Diagnosis not present

## 2020-03-31 LAB — COMPREHENSIVE METABOLIC PANEL
ALT: 22 U/L (ref 0–44)
AST: 29 U/L (ref 15–41)
Albumin: 3.5 g/dL (ref 3.5–5.0)
Alkaline Phosphatase: 87 U/L (ref 38–126)
Anion gap: 12 (ref 5–15)
BUN: 13 mg/dL (ref 6–20)
CO2: 23 mmol/L (ref 22–32)
Calcium: 9.1 mg/dL (ref 8.9–10.3)
Chloride: 102 mmol/L (ref 98–111)
Creatinine, Ser: 1.1 mg/dL (ref 0.61–1.24)
GFR calc Af Amer: >60 mL/min (ref >60)
GFR calc non Af Amer: >60 mL/min (ref >60)
Glucose, Bld: 126 mg/dL — ABNORMAL HIGH (ref 70–99)
Potassium: 3.9 mmol/L (ref 3.5–5.1)
Sodium: 137 mmol/L (ref 135–145)
Total Bilirubin: 0.7 mg/dL (ref 0.3–1.2)
Total Protein: 7.6 g/dL (ref 6.5–8.1)

## 2020-03-31 LAB — CBC WITH DIFFERENTIAL/PLATELET
Abs Immature Granulocytes: 0.02 10*3/uL (ref 0.00–0.07)
Basophils Absolute: 0.1 10*3/uL (ref 0.0–0.1)
Basophils Relative: 1 %
Eosinophils Absolute: 0.4 10*3/uL (ref 0.0–0.5)
Eosinophils Relative: 4 %
HCT: 45.3 % (ref 39.0–52.0)
Hemoglobin: 16.1 g/dL (ref 13.0–17.0)
Immature Granulocytes: 0 %
Lymphocytes Relative: 9 %
Lymphs Abs: 0.9 10*3/uL (ref 0.7–4.0)
MCH: 34.9 pg — ABNORMAL HIGH (ref 26.0–34.0)
MCHC: 35.5 g/dL (ref 30.0–36.0)
MCV: 98.3 fL (ref 80.0–100.0)
Monocytes Absolute: 0.8 10*3/uL (ref 0.1–1.0)
Monocytes Relative: 8 %
Neutro Abs: 8 10*3/uL — ABNORMAL HIGH (ref 1.7–7.7)
Neutrophils Relative %: 78 %
Platelets: 153 10*3/uL (ref 150–400)
RBC: 4.61 MIL/uL (ref 4.22–5.81)
RDW: 11.8 % (ref 11.5–15.5)
WBC: 10.1 10*3/uL (ref 4.0–10.5)
nRBC: 0 % (ref 0.0–0.2)

## 2020-03-31 LAB — TSH: TSH: 0.663 u[IU]/mL (ref 0.350–4.500)

## 2020-03-31 MED ORDER — SPIRIVA HANDIHALER 18 MCG IN CAPS: 18.0000 ug | ORAL_CAPSULE | Freq: Every day | RESPIRATORY_TRACT | 0 refills | Status: DC

## 2020-03-31 MED ORDER — SODIUM CHLORIDE 0.9% FLUSH
10.0000 mL | INTRAVENOUS | Status: DC | PRN
  Administered 2020-03-31: 10 mL via INTRAVENOUS
  Filled 2020-03-31: qty 10

## 2020-03-31 MED ORDER — ALTEPLASE 2 MG IJ SOLR
2.0000 mg | Freq: Once | INTRAMUSCULAR | Status: AC
  Administered 2020-03-31: 2 mg
  Filled 2020-03-31: qty 2

## 2020-03-31 MED ORDER — HEPARIN SOD (PORK) LOCK FLUSH 100 UNIT/ML IV SOLN
INTRAVENOUS | Status: AC
  Filled 2020-03-31: qty 5

## 2020-03-31 MED ORDER — HEPARIN SOD (PORK) LOCK FLUSH 100 UNIT/ML IV SOLN
500.0000 [IU] | Freq: Once | INTRAVENOUS | Status: AC
  Administered 2020-03-31: 500 [IU] via INTRAVENOUS
  Filled 2020-03-31: qty 5

## 2020-03-31 NOTE — Progress Notes (Signed)
After TPA was instilled for 120 minutes per protocol, RN received blood return and wasted 5 mL of blood from port per protocol. MD and treatment made aware. Pt updated and all questions answered at this time. Pt stable for discharge.  Jamariya Davidoff [employer]

## 2020-03-31 NOTE — Progress Notes (Signed)
Patient reports a productive cough during the night for 1 week.

## 2020-03-31 NOTE — Progress Notes (Signed)
Hematology/Oncology Follow up note [facility] [phone] [phone]   Patient Care Team: Tracie Harrier, MD as PCP - General (Internal Medicine) Earlie Server, MD as Consulting Physician (Hematology and Oncology)  REASON FOR VISIT:  Follow up for treatment of squamous lung cancer.   HISTORY OF PRESENTING ILLNESS:  # Dec 2019 Stage I squamous lung cancer #s/p Bronchoscopy on 06/25/2018. subcarina EBUS FNA was non diagnostic, hypocellular specimen.  # 08/13/2018 CT guided right upper lobe biopsy pathology showed dense fibrosis and mixed inflammatory cells with prominent polytypic plasma cells component. Focal benign bronchial wall and alveolar spaces. No malignancy was identified.   # 08/13/2018 underwent right thoracotomy and wedge resection of a right upper lobe mass.  Frozen section was consistent with an inflammatory process.  On the second postop day, preliminary pathology reports high-grade malignancy.  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  08/20/2018 Patient was therefore taken back to the OR and underwent take complete lobectomy  pT1b pN0 cM0 stage I squamous cell lung cancer.  Margin is negative.  Recommend observation.    # 12/03/2018 CT chest w contrast showed local recurrence.  12/10/2018 PET showed No evidence of distant metastatic disease # 12/26/2018 s/p bronchoscopy biopsy. Confirmed local recurrence of squamous cell lung cancer.  medi port placed by Dr.Oaks.  Cancer treatment Started concurrent chemoradiation on 01/16/2019 Carboplatin AUC of 2 and Taxol 45 mg/m2 weekly finished in 03/05/2019 04/10/2019, patient started on durvalumab maintenance.  INTERVAL HISTORY Samuel Choi is a 54 y.o. male who has above history reviewed by me today presents for evaluation prior to immunotherapy for treatment of  recurrent  squamous cell lung cancer. Patient reports productive cough with clear phlegm recently. No shortness of breath when  he has cough . no fever, chills,  Review of Systems  Constitutional: Negative for appetite change, chills, diaphoresis, fatigue, fever and unexpected weight change.  HENT:   Negative for hearing loss, lump/mass, nosebleeds, sore throat and voice change.   Eyes: Negative for eye problems and icterus.  Respiratory: Positive for cough. Negative for chest tightness, hemoptysis, shortness of breath and wheezing.   Cardiovascular: Negative for chest pain and leg swelling.  Gastrointestinal: Negative for abdominal distention, abdominal pain, blood in stool, diarrhea, nausea and rectal pain.  Endocrine: Negative for hot flashes.  Genitourinary: Negative for bladder incontinence, difficulty urinating, dysuria, frequency, hematuria and nocturia.   Musculoskeletal: Negative for arthralgias, back pain, flank pain, gait problem and myalgias.  Skin: Negative for itching and rash.  Neurological: Negative for dizziness, gait problem, headaches, light-headedness, numbness and seizures.  Hematological: Negative for adenopathy. Does not bruise/bleed easily.  Psychiatric/Behavioral: Negative for confusion and decreased concentration. The patient is not nervous/anxious.     MEDICAL HISTORY:  Past Medical History:  Diagnosis Date  . Alcohol abuse    usually drinks 2-3 drinks per day  . Atherosclerosis 06/2018  . Chronic sinusitis   . Dehydration 02/07/2019  . Emphysema of lung ([place]) 06/2018   patient unaware of this.  . Hip fracture ([place]) 05/2018   no surgery  . History of kidney stones 05/2018   per xray, bilateral nephrolitiasis  . Hypertension   . Squamous cell carcinoma of lung, right ([place]) 06/2018    SURGICAL HISTORY: Past Surgical History:  Procedure Laterality Date  . BRAIN SURGERY  10/2017   nasal/sinus endoscopy. mass benign  . ELECTROMAGNETIC NAVIGATION BROCHOSCOPY Right 06/25/2018   Procedure: ELECTROMAGNETIC NAVIGATION BRONCHOSCOPY;  Surgeon: [name], MD;  Location: [facility] ORS;  Service: Cardiopulmonary;  Laterality: Right;  . ENDOBRONCHIAL ULTRASOUND Right 06/25/2018   Procedure: ENDOBRONCHIAL ULTRASOUND;  Surgeon: [name], MD;  Location: [facility] OR[facility];  Service: Cardiopulmonary;  Laterality: Right;  . ENDOBRONCHIAL ULTRASOUND Right 12/26/2018   Procedure: ENDOBRONCHIAL ULTRASOUND RIGHT;  Surgeon: [name], MD;  Location: [facility] ORS;  Service: Cardiopulmonary;  Laterality: Right;  . FLEXIBLE BRONCHOSCOPY N/A 08/20/2018   Procedure: FLEXIBLE BRONCHOSCOPY PREOP;  Surgeon: [name], MD;  Location: [facility] ORS;  Service: Thoracic;  Laterality: N/A;  . IR CV LINE INJECTION  04/04/2019  . NASAL SINUS SURGERY  10/2017   At [facility], frontal sinusotomy, ethmoidectomy, resection anterior cranial fossa neoplasm, turbinate resection  . PORTACATH PLACEMENT Left 01/15/2019   Procedure: INSERTION PORT-A-CATH;  Surgeon: [name], MD;  Location: [facility] OR[facility];  Service: General;  Laterality: Left;  . THORACOTOMY Right 08/13/2018   Procedure: PREOP BROCHOSCOPY WITH RIGHT THORACOTOMY AND RUL RESECTION;  Surgeon: [name], MD;  Location: [facility];  Service: General;  Laterality: Right;  . THORACOTOMY Right 08/20/2018   Procedure: THORACOTOMY MAJOR RIGHT UPPER LOBE LOBECTOMY;  Surgeon: [name], MD;  Location: [facility];  Service: Thoracic;  Laterality: Right;  . TOE SURGERY Left    pin in left toe    SOCIAL HISTORY: Social History   Socioeconomic History  . Marital status: Married    Spouse name: lisa  . Number of children: Not on file  . Years of education: Not on file  . Highest education level: Not on file  Occupational History  . Occupation: welding    Comment: taking time off to resolve issues  Tobacco Use  . Smoking status: Former Smoker    Packs/day: 0.50    Years: 15.00    Pack years: 7.50    Types: Cigarettes    Quit date: 08/2018    Years since quitting: 1.6  . Smokeless tobacco: Never Used  Vaping Use  . Vaping Use: Never used  Substance and Sexual  Activity  . Alcohol use: Yes    Alcohol/week: 3.0 standard drinks    Types: 3 Cans of beer per week    Comment: usually 2 drinks per week, per patient  . Drug use: No  . Sexual activity: Not on file  Other Topics Concern  . Not on file  Social History Narrative  . Not on file   Social Determinants of Health   Financial Resource Strain:   . Difficulty of Paying Living Expenses: Not on file  Food Insecurity:   . Worried About Charity fundraiser in the Last Year: Not on file  . Ran Out of Food in the Last Year: Not on file  Transportation Needs:   . Lack of Transportation (Medical): Not on file  . Lack of Transportation (Non-Medical): Not on file  Physical Activity:   . Days of Exercise per Week: Not on file  . Minutes of Exercise per Session: Not on file  Stress:   . Feeling of Stress : Not on file  Social Connections:   . Frequency of Communication with Friends and Family: Not on file  . Frequency of Social Gatherings with Friends and Family: Not on file  . Attends Religious Services: Not on file  . Active Member of Clubs or Organizations: Not on file  . Attends Archivist Meetings: Not on file  . Marital Status: Not on file  Intimate Partner Violence:   . Fear of Current or Ex-Partner: Not on file  . Emotionally Abused: Not on  file  . Physically Abused: Not on file  . Sexually Abused: Not on file    FAMILY HISTORY: Family History  Problem Relation Age of Onset  . Breast cancer Mother   . Diabetes Mother   . Lung cancer Father   . Hypertension Father     ALLERGIES:  has No Known Allergies.  MEDICATIONS:  Current Outpatient Medications  Medication Sig Dispense Refill  . albuterol (VENTOLIN HFA) 108 (90 Base) MCG/ACT inhaler Inhale 2 puffs into the lungs every 6 (six) hours as needed for wheezing or shortness of breath. 8 g 6  . cloNIDine (CATAPRES) 0.1 MG tablet Take 0.1 mg by mouth daily.   5  . diltiazem (CARDIZEM CD) 120 MG 24 hr capsule Take 120  mg by mouth daily.    . folic acid (V-R FOLIC ACID) 782 MCG tablet Take 1 tablet (400 mcg total) by mouth daily. 90 tablet 1  . hydrALAZINE (APRESOLINE) 100 MG tablet Take 50 mg by mouth 2 (two) times daily.     . hydrochlorothiazide (HYDRODIURIL) 12.5 MG tablet Take 12.5 mg by mouth daily.     Marland Kitchen KLOR-CON M20 20 MEQ tablet TAKE 1 TABLET BY MOUTH TWICE A DAY 60 tablet 0  . latanoprost (XALATAN) 0.005 % ophthalmic solution 1 drop at bedtime.    . lidocaine-prilocaine (EMLA) cream Apply to affected area once 30 g 3  . magnesium chloride (SLOW-MAG) 64 MG TBEC SR tablet Take 1 tablet (64 mg total) by mouth 2 (two) times daily. TAKE 1 TABLET (64 MG TOTAL) BY MOUTH DAILY. 60 tablet 1  . methocarbamol (ROBAXIN) 500 MG tablet Take 1 tablet (500 mg total) by mouth at bedtime. 30 tablet 0  . olmesartan (BENICAR) 40 MG tablet Take 40 mg by mouth every other day.     . ondansetron (ZOFRAN) 8 MG tablet Take 1 tablet (8 mg total) by mouth 2 (two) times daily as needed for refractory nausea / vomiting. Start on day 3 after chemo. 30 tablet 1  . prochlorperazine (COMPAZINE) 10 MG tablet Take 1 tablet (10 mg total) by mouth every 6 (six) hours as needed (Nausea or vomiting). 30 tablet 1  . vitamin B-12 (CYANOCOBALAMIN) 1000 MCG tablet Take 1 tablet (1,000 mcg total) by mouth daily. 90 tablet 1  . tiotropium (SPIRIVA HANDIHALER) 18 MCG inhalation capsule Place 1 capsule (18 mcg total) into inhaler and inhale daily. 30 capsule 0   No current facility-administered medications for this visit.   Facility-Administered Medications Ordered in Other Visits  Medication Dose Route Frequency Provider Last Rate Last Admin  . albuterol (PROVENTIL) (2.5 MG/3ML) 0.083% nebulizer solution 2.5 mg  2.5 mg Nebulization Once System, Provider Not In      . heparin lock flush 100 unit/mL  500 Units Intravenous Once Earlie Server, MD      . sodium chloride flush (NS) 0.9 % injection 10 mL  10 mL Intravenous PRN Earlie Server, MD   10 mL at  04/04/19 0903  . sodium chloride flush (NS) 0.9 % injection 10 mL  10 mL Intravenous PRN Earlie Server, MD   10 mL at 03/31/20 0827     PHYSICAL EXAMINATION: ECOG PERFORMANCE STATUS: 0 - Asymptomatic Vitals:   03/31/20 0858  BP: (!) 140/93  Pulse: 99  Resp: 18  Temp: (!) 97.3 F (36.3 C)  SpO2: 99%   Filed Weights   03/31/20 0858  Weight: 141 lb 9.6 oz (64.2 kg)    Physical Exam Constitutional:  General: He is not in acute distress. HENT:     Head: Normocephalic and atraumatic.  Eyes:     General: No scleral icterus. Cardiovascular:     Rate and Rhythm: Normal rate and regular rhythm.     Heart sounds: Normal heart sounds.  Pulmonary:     Effort: Pulmonary effort is normal. No respiratory distress.     Breath sounds: Wheezing present.  Abdominal:     General: Bowel sounds are normal. There is no distension.     Palpations: Abdomen is soft.  Musculoskeletal:        General: No deformity. Normal range of motion.     Cervical back: Normal range of motion and neck supple.  Skin:    General: Skin is warm and dry.     Findings: No erythema or rash.  Neurological:     General: No focal deficit present.     Mental Status: He is alert and oriented to person, place, and time. Mental status is at baseline.     Cranial Nerves: No cranial nerve deficit.     Coordination: Coordination normal.  Psychiatric:        Mood and Affect: Mood normal.      LABORATORY DATA:  I have reviewed the data as listed Lab Results  Component Value Date   WBC 10.1 03/31/2020   HGB 16.1 03/31/2020   HCT 45.3 03/31/2020   MCV 98.3 03/31/2020   PLT 153 03/31/2020   Recent Labs    03/03/20 0830 03/17/20 0830 03/31/20 0825  NA 141 134* 137  K 3.9 4.6 3.9  CL 105 101 102  CO2 23 20* 23  GLUCOSE 116* 167* 126*  BUN _0 CREATININE 1.04 1.19 1.10  CALCIUM 9.4 9.0 9.1  GFRNONAA >60 >60 >60  GFRAA >60 >60 >60  PROT 7.7 7.7 7.6  ALBUMIN 3.6 3.5 3.5  AST 31 32 29  ALT _1 ALKPHOS 97 86 87  BILITOT 0.4 0.8 0.7   Iron/TIBC/Ferritin/ %Sat No results found for: IRON, TIBC, FERRITIN, IRONPCTSAT   RADIOGRAPHIC STUDIES: I have personally reviewed the radiological images as listed and agreed with the findings in the report. DG Chest 2 View  Result Date: 02/04/2020 CLINICAL DATA:  Cough, history of lung cancer EXAM: CHEST - 2 VIEW COMPARISON:  01/15/2019 FINDINGS: Post right upper lobectomy. Interstitial thickening is present likely reflecting scarring/radiation change. Trace right pleural effusion remains present. Stable cardiomediastinal contours with normal heart size. Left chest wall port catheter tip overlies SVC. No acute osseous abnormality. IMPRESSION: Probable postoperative and radiation changes. Trace right pleural effusion. Electronically Signed   By: Macy Mis M.D.   On: 02/04/2020 16:58   CT CHEST W CONTRAST  Result Date: 02/06/2020 CLINICAL DATA:  Lung cancer treated with surgery, chemotherapy and radiation therapy. EXAM: CT CHEST WITH CONTRAST TECHNIQUE: Multidetector CT imaging of the chest was performed during intravenous contrast administration. CONTRAST:  34m OMNIPAQUE IOHEXOL 300 MG/ML  SOLN COMPARISON:  11/21/2019. FINDINGS: Cardiovascular: Left IJ Port-A-Cath terminates in the SVC. Atherosclerotic calcification of the aorta and coronary arteries. Pulmonic trunk is enlarged. Heart size normal. Small amount of pericardial fluid appears similar. Mediastinum/Nodes: Subcentimeter low-attenuation nodules in the thyroid. No follow-up recommended (ref: J Am Coll Radiol. 2015 Feb;12(2): 143-50).Low internal jugular lymph nodes are not enlarged by CT size criteria. No pathologically enlarged mediastinal, left hilar or axillary lymph nodes. Evaluation of the right hilum is somewhat limited due to soft tissue thickening  and postoperative changes. Esophagus is grossly unremarkable. Lungs/Pleura: Right upper lobectomy with associated pleuroparenchymal scarring  and post radiation architectural distortion in the medial right hemithorax. Peribronchovascular nodularity in the right lower lobe is new or increased from the prior exam. In addition to pre-existing stable pulmonary nodules, there are new bilateral pulmonary nodules measuring up to 5 mm in the medial right lower lobe (3/73). Centrilobular emphysema. Tiny right pleural effusion with associated pleural thickening, stable. Airway is otherwise unremarkable. Upper Abdomen: Visualized portions of the liver, gallbladder and adrenal glands are unremarkable. Stones are seen in the kidneys bilaterally. Renal cortical scarring on the right. Subcentimeter low-attenuation lesion in the left kidney is partially imaged. Visualized portions of the kidneys, spleen, pancreas, stomach and bowel are otherwise unremarkable. No upper abdominal adenopathy. Musculoskeletal: Degenerative changes in the spine. No worrisome lytic or sclerotic lesions. IMPRESSION: 1. Postoperative and post radiation changes in the right lung. Scattered new bilateral pulmonary nodules are worrisome for metastatic disease. 2. Chronic small right fibrothorax. 3. Bilateral renal stones. 4. Aortic atherosclerosis (ICD10-I70.0). Coronary artery calcification. 5. Enlarged pulmonic trunk, indicative of pulmonary arterial hypertension. 6.  Emphysema (ICD10-J43.9). Electronically Signed   By: Lorin Picket M.D.   On: 02/06/2020 14:03   DG Fluoro Guide CV Line Left  Result Date: 03/18/2020 INDICATION: No blood return from Port-A-Cath. EXAM: PORT-A-CATH CHECK MEDICATIONS: None. ANESTHESIA/SEDATION: None. FLUOROSCOPY TIME:  Fluoroscopy Time: 1 minutes 6 seconds (22.5 mGy). COMPLICATIONS: None. PROCEDURE: Standard Port-A-Cath injection performed under fluoroscopic guidance. Nonionic contrast was used. Catheter is intact. Small fibrin sheath noted on the distal aspect of the Port-A-Cath. Flow into the venous system is present. There is noted the right hilum. Density  noted over the right hilum IMPRESSION: Small fibrin sheath noted over distal aspect of the Port-A-Cath. Intravenous flow is noted. Phone to the patient's nurse at the time of the study. Electronically Signed   By: Marcello Moores  Register   On: 03/18/2020 11:10     ASSESSMENT & PLAN:  1. Squamous cell carcinoma of lung, right ([place])   2. Hypomagnesemia   3. Cough   4. Wheezing    #Recurrent Squamous cell carcinoma of lung,  Currently on maintenance immunotherapy with durvalumab every 2 weeks Labs are reviewed and discussed with patient.  Hold treatment. See below Repeat CT chest for follow-up.  #Cough and wheezing and shortness of breath, patient has  emphysema. He uses only albuterol as needed. Recommend him to try Spiriva. If no improvement, he may need a course of antibiotics for bronchitis. Asked patient to update me if symptoms are not improving  #Port-A-Cath in place, dye study showed fibrin sheath. Patient had Cathflo treatment today. #Chronic hypomagnesia.  Continue slow magnesium twice daily.  #Chronic hypokalemia, continue oral potassium supplementation 20 mEq today.   Return of visit: Follow-up in 1 week   Earlie Server, MD, PhD Hematology [place] 03/31/2020

## 2020-04-05 ENCOUNTER — Other Ambulatory Visit: Payer: Self-pay | Admitting: Oncology

## 2020-04-07 ENCOUNTER — Encounter: Payer: Self-pay | Admitting: Oncology

## 2020-04-07 ENCOUNTER — Other Ambulatory Visit: Payer: Self-pay | Admitting: Oncology

## 2020-04-07 ENCOUNTER — Inpatient Hospital Stay: Payer: Managed Care, Other (non HMO)

## 2020-04-07 ENCOUNTER — Other Ambulatory Visit: Payer: Self-pay

## 2020-04-07 ENCOUNTER — Inpatient Hospital Stay (HOSPITAL_BASED_OUTPATIENT_CLINIC_OR_DEPARTMENT_OTHER): Payer: Managed Care, Other (non HMO) | Admitting: Oncology

## 2020-04-07 VITALS — BP 158/106 | HR 84 | Temp 97.1°F | Resp 18 | Wt 142.9 lb

## 2020-04-07 DIAGNOSIS — C3491 Malignant neoplasm of unspecified part of right bronchus or lung: Secondary | ICD-10-CM | POA: Diagnosis not present

## 2020-04-07 DIAGNOSIS — J441 Chronic obstructive pulmonary disease with (acute) exacerbation: Secondary | ICD-10-CM | POA: Diagnosis not present

## 2020-04-07 DIAGNOSIS — R059 Cough, unspecified: Secondary | ICD-10-CM

## 2020-04-07 DIAGNOSIS — Z5112 Encounter for antineoplastic immunotherapy: Secondary | ICD-10-CM | POA: Diagnosis not present

## 2020-04-07 LAB — CBC WITH DIFFERENTIAL/PLATELET
Abs Immature Granulocytes: 0.03 10*3/uL (ref 0.00–0.07)
Basophils Absolute: 0.1 10*3/uL (ref 0.0–0.1)
Basophils Relative: 1 %
Eosinophils Absolute: 0.3 10*3/uL (ref 0.0–0.5)
Eosinophils Relative: 3 %
HCT: 40.5 % (ref 39.0–52.0)
Hemoglobin: 14.4 g/dL (ref 13.0–17.0)
Immature Granulocytes: 0 %
Lymphocytes Relative: 11 %
Lymphs Abs: 1.1 10*3/uL (ref 0.7–4.0)
MCH: 34.6 pg — ABNORMAL HIGH (ref 26.0–34.0)
MCHC: 35.6 g/dL (ref 30.0–36.0)
MCV: 97.4 fL (ref 80.0–100.0)
Monocytes Absolute: 0.7 10*3/uL (ref 0.1–1.0)
Monocytes Relative: 7 %
Neutro Abs: 8.1 10*3/uL — ABNORMAL HIGH (ref 1.7–7.7)
Neutrophils Relative %: 78 %
Platelets: 169 10*3/uL (ref 150–400)
RBC: 4.16 MIL/uL — ABNORMAL LOW (ref 4.22–5.81)
RDW: 12.1 % (ref 11.5–15.5)
WBC: 10.2 10*3/uL (ref 4.0–10.5)
nRBC: 0 % (ref 0.0–0.2)

## 2020-04-07 LAB — COMPREHENSIVE METABOLIC PANEL
ALT: 19 U/L (ref 0–44)
AST: 25 U/L (ref 15–41)
Albumin: 3.4 g/dL — ABNORMAL LOW (ref 3.5–5.0)
Alkaline Phosphatase: 76 U/L (ref 38–126)
Anion gap: 12 (ref 5–15)
BUN: 11 mg/dL (ref 6–20)
CO2: 21 mmol/L — ABNORMAL LOW (ref 22–32)
Calcium: 8.8 mg/dL — ABNORMAL LOW (ref 8.9–10.3)
Chloride: 104 mmol/L (ref 98–111)
Creatinine, Ser: 1.1 mg/dL (ref 0.61–1.24)
GFR calc Af Amer: >60 mL/min (ref >60)
GFR calc non Af Amer: >60 mL/min (ref >60)
Glucose, Bld: 163 mg/dL — ABNORMAL HIGH (ref 70–99)
Potassium: 3.7 mmol/L (ref 3.5–5.1)
Sodium: 137 mmol/L (ref 135–145)
Total Bilirubin: 0.5 mg/dL (ref 0.3–1.2)
Total Protein: 7.1 g/dL (ref 6.5–8.1)

## 2020-04-07 MED ORDER — SODIUM CHLORIDE 0.9% FLUSH
10.0000 mL | Freq: Once | INTRAVENOUS | Status: DC
  Filled 2020-04-07: qty 10

## 2020-04-07 MED ORDER — HEPARIN SOD (PORK) LOCK FLUSH 100 UNIT/ML IV SOLN
500.0000 [IU] | Freq: Once | INTRAVENOUS | Status: AC
  Administered 2020-04-07: 500 [IU] via INTRAVENOUS
  Filled 2020-04-07: qty 5

## 2020-04-07 MED ORDER — HEPARIN SOD (PORK) LOCK FLUSH 100 UNIT/ML IV SOLN
INTRAVENOUS | Status: AC
  Filled 2020-04-07: qty 5

## 2020-04-07 MED ORDER — PREDNISONE 10 MG (21) PO TBPK: ORAL_TABLET | ORAL | 0 refills | Status: DC

## 2020-04-07 NOTE — Progress Notes (Signed)
Patient here for follow up. Blood pressure elevated. Denies headache, dizziness or light headedness.

## 2020-04-07 NOTE — Progress Notes (Signed)
Hematology/Oncology Follow up note [facility] [phone] [phone]   Patient Care Team: Tracie Harrier, MD as PCP - General (Internal Medicine) Earlie Server, MD as Consulting Physician (Hematology and Oncology)  REASON FOR VISIT:  Follow up for treatment of squamous lung cancer.   HISTORY OF PRESENTING ILLNESS:  # Dec 2019 Stage I squamous lung cancer #s/p Bronchoscopy on 06/25/2018. subcarina EBUS FNA was non diagnostic, hypocellular specimen.  # 08/13/2018 CT guided right upper lobe biopsy pathology showed dense fibrosis and mixed inflammatory cells with prominent polytypic plasma cells component. Focal benign bronchial wall and alveolar spaces. No malignancy was identified.   # 08/13/2018 underwent right thoracotomy and wedge resection of a right upper lobe mass.  Frozen section was consistent with an inflammatory process.  On the second postop day, preliminary pathology reports high-grade malignancy.  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  08/20/2018 Patient was therefore taken back to the OR and underwent take complete lobectomy  pT1b pN0 cM0 stage I squamous cell lung cancer.  Margin is negative.  Recommend observation.    # 12/03/2018 CT chest w contrast showed local recurrence.  12/10/2018 PET showed No evidence of distant metastatic disease # 12/26/2018 s/p bronchoscopy biopsy. Confirmed local recurrence of squamous cell lung cancer.  medi port placed by Dr.Oaks.  Cancer treatment Started concurrent chemoradiation on 01/16/2019 Carboplatin AUC of 2 and Taxol 45 mg/m2 weekly finished in 03/05/2019 04/10/2019, patient started on durvalumab maintenance.  INTERVAL HISTORY Samuel Choi is a 54 y.o. male who has above history reviewed by me today presents for evaluation prior to immunotherapy for treatment of  recurrent  squamous cell lung cancer. Patient reports that cough and shortness of breath only slightly improved.  Continues to  have cough with clear phlegm.  No fever or chills.  Spiriva was added at the last visit.   Review of Systems  Constitutional: Negative for appetite change, chills, diaphoresis, fatigue, fever and unexpected weight change.  HENT:   Negative for hearing loss, lump/mass, nosebleeds, sore throat and voice change.   Eyes: Negative for eye problems and icterus.  Respiratory: Positive for cough and shortness of breath. Negative for chest tightness, hemoptysis and wheezing.   Cardiovascular: Negative for chest pain and leg swelling.  Gastrointestinal: Negative for abdominal distention, abdominal pain, blood in stool, diarrhea, nausea and rectal pain.  Endocrine: Negative for hot flashes.  Genitourinary: Negative for bladder incontinence, difficulty urinating, dysuria, frequency, hematuria and nocturia.   Musculoskeletal: Negative for arthralgias, back pain, flank pain, gait problem and myalgias.  Skin: Negative for itching and rash.  Neurological: Negative for dizziness, gait problem, headaches, light-headedness, numbness and seizures.  Hematological: Negative for adenopathy. Does not bruise/bleed easily.  Psychiatric/Behavioral: Negative for confusion and decreased concentration. The patient is not nervous/anxious.     MEDICAL HISTORY:  Past Medical History:  Diagnosis Date  . Alcohol abuse    usually drinks 2-3 drinks per day  . Atherosclerosis 06/2018  . Chronic sinusitis   . Dehydration 02/07/2019  . Emphysema of lung ([place]) 06/2018   patient unaware of this.  . Hip fracture ([place]) 05/2018   no surgery  . History of kidney stones 05/2018   per xray, bilateral nephrolitiasis  . Hypertension   . Squamous cell carcinoma of lung, right ([place]) 06/2018    SURGICAL HISTORY: Past Surgical History:  Procedure Laterality Date  . BRAIN SURGERY  10/2017   nasal/sinus endoscopy. mass benign  . ELECTROMAGNETIC NAVIGATION BROCHOSCOPY Right 06/25/2018  Procedure: ELECTROMAGNETIC NAVIGATION  BRONCHOSCOPY;  Surgeon: [name], MD;  Location: [facility] ORS;  Service: Cardiopulmonary;  Laterality: Right;  . ENDOBRONCHIAL ULTRASOUND Right 06/25/2018   Procedure: ENDOBRONCHIAL ULTRASOUND;  Surgeon: [name], MD;  Location: [facility] OR[facility];  Service: Cardiopulmonary;  Laterality: Right;  . ENDOBRONCHIAL ULTRASOUND Right 12/26/2018   Procedure: ENDOBRONCHIAL ULTRASOUND RIGHT;  Surgeon: [name], MD;  Location: [facility] ORS;  Service: Cardiopulmonary;  Laterality: Right;  . FLEXIBLE BRONCHOSCOPY N/A 08/20/2018   Procedure: FLEXIBLE BRONCHOSCOPY PREOP;  Surgeon: [name], MD;  Location: [facility] ORS;  Service: Thoracic;  Laterality: N/A;  . IR CV LINE INJECTION  04/04/2019  . NASAL SINUS SURGERY  10/2017   At [facility], frontal sinusotomy, ethmoidectomy, resection anterior cranial fossa neoplasm, turbinate resection  . PORTACATH PLACEMENT Left 01/15/2019   Procedure: INSERTION PORT-A-CATH;  Surgeon: [name], MD;  Location: [facility] ORS;  Service: General;  Laterality: Left;  . THORACOTOMY Right 08/13/2018   Procedure: PREOP BROCHOSCOPY WITH RIGHT THORACOTOMY AND RUL RESECTION;  Surgeon: [name], MD;  Location: [facility] OR[facility];  Service: General;  Laterality: Right;  . THORACOTOMY Right 08/20/2018   Procedure: THORACOTOMY MAJOR RIGHT UPPER LOBE LOBECTOMY;  Surgeon: [name], MD;  Location: [facility] OR[facility];  Service: Thoracic;  Laterality: Right;  . TOE SURGERY Left    pin in left toe    SOCIAL HISTORY: Social History   Socioeconomic History  . Marital status: Married    Spouse name: lisa  . Number of children: Not on file  . Years of education: Not on file  . Highest education level: Not on file  Occupational History  . Occupation: welding    Comment: taking time off to resolve issues  Tobacco Use  . Smoking status: Former Smoker    Packs/day: 0.50    Years: 15.00    Pack years: 7.50    Types: Cigarettes    Quit date: 08/2018    Years since quitting: 1.6  . Smokeless tobacco: Never Used    Vaping Use  . Vaping Use: Never used  Substance and Sexual Activity  . Alcohol use: Yes    Alcohol/week: 3.0 standard drinks    Types: 3 Cans of beer per week    Comment: usually 2 drinks per week, per patient  . Drug use: No  . Sexual activity: Not on file  Other Topics Concern  . Not on file  Social History Narrative  . Not on file   Social Determinants of Health   Financial Resource Strain:   . Difficulty of Paying Living Expenses: Not on file  Food Insecurity:   . Worried About Charity fundraiser in the Last Year: Not on file  . Ran Out of Food in the Last Year: Not on file  Transportation Needs:   . Lack of Transportation (Medical): Not on file  . Lack of Transportation (Non-Medical): Not on file  Physical Activity:   . Days of Exercise per Week: Not on file  . Minutes of Exercise per Session: Not on file  Stress:   . Feeling of Stress : Not on file  Social Connections:   . Frequency of Communication with Friends and Family: Not on file  . Frequency of Social Gatherings with Friends and Family: Not on file  . Attends Religious Services: Not on file  . Active Member of Clubs or Organizations: Not on file  . Attends Archivist Meetings: Not on file  . Marital Status: Not on file  Intimate Partner Violence:   .  Fear of Current or Ex-Partner: Not on file  . Emotionally Abused: Not on file  . Physically Abused: Not on file  . Sexually Abused: Not on file    FAMILY HISTORY: Family History  Problem Relation Age of Onset  . Breast cancer Mother   . Diabetes Mother   . Lung cancer Father   . Hypertension Father     ALLERGIES:  has No Known Allergies.  MEDICATIONS:  Current Outpatient Medications  Medication Sig Dispense Refill  . albuterol (VENTOLIN HFA) 108 (90 Base) MCG/ACT inhaler Inhale 2 puffs into the lungs every 6 (six) hours as needed for wheezing or shortness of breath. 8 g 6  . cloNIDine (CATAPRES) 0.1 MG tablet Take 0.1 mg by mouth daily.    5  . diltiazem (CARDIZEM CD) 120 MG 24 hr capsule Take 120 mg by mouth daily.    . folic acid (V-R FOLIC ACID) 923 MCG tablet Take 1 tablet (400 mcg total) by mouth daily. 90 tablet 1  . hydrALAZINE (APRESOLINE) 100 MG tablet Take 50 mg by mouth 2 (two) times daily.     . hydrochlorothiazide (HYDRODIURIL) 12.5 MG tablet Take 12.5 mg by mouth daily.     Marland Kitchen KLOR-CON M20 20 MEQ tablet TAKE 1 TABLET BY MOUTH TWICE A DAY 60 tablet 0  . latanoprost (XALATAN) 0.005 % ophthalmic solution 1 drop at bedtime.    . lidocaine-prilocaine (EMLA) cream Apply to affected area once 30 g 3  . magnesium chloride (SLOW-MAG) 64 MG TBEC SR tablet Take 1 tablet (64 mg total) by mouth 2 (two) times daily. TAKE 1 TABLET (64 MG TOTAL) BY MOUTH DAILY. 60 tablet 1  . methocarbamol (ROBAXIN) 500 MG tablet Take 1 tablet (500 mg total) by mouth at bedtime. 30 tablet 0  . olmesartan (BENICAR) 40 MG tablet Take 40 mg by mouth every other day.     . ondansetron (ZOFRAN) 8 MG tablet Take 1 tablet (8 mg total) by mouth 2 (two) times daily as needed for refractory nausea / vomiting. Start on day 3 after chemo. 30 tablet 1  . prochlorperazine (COMPAZINE) 10 MG tablet Take 1 tablet (10 mg total) by mouth every 6 (six) hours as needed (Nausea or vomiting). 30 tablet 1  . tiotropium (SPIRIVA HANDIHALER) 18 MCG inhalation capsule Place 1 capsule (18 mcg total) into inhaler and inhale daily. 30 capsule 0  . vitamin B-12 (CYANOCOBALAMIN) 1000 MCG tablet Take 1 tablet (1,000 mcg total) by mouth daily. 90 tablet 1  . predniSONE (STERAPRED UNI-PAK 21 TAB) 10 MG (21) TBPK tablet Day 1 take 6 tablets, Day 2 take 5 tablets.  Day 3 take 4 tablets.  Day 4 take 3 tablets Day 5 take 2 tablets.  Day 6 take 1 tablet and stop. 21 tablet 0   No current facility-administered medications for this visit.   Facility-Administered Medications Ordered in Other Visits  Medication Dose Route Frequency Provider Last Rate Last Admin  . albuterol (PROVENTIL)  (2.5 MG/3ML) 0.083% nebulizer solution 2.5 mg  2.5 mg Nebulization Once System, Provider Not In      . heparin lock flush 100 unit/mL  500 Units Intravenous Once Earlie Server, MD      . sodium chloride flush (NS) 0.9 % injection 10 mL  10 mL Intravenous PRN Earlie Server, MD   10 mL at 04/04/19 0903  . sodium chloride flush (NS) 0.9 % injection 10 mL  10 mL Intravenous Once Earlie Server, MD  PHYSICAL EXAMINATION: ECOG PERFORMANCE STATUS: 0 - Asymptomatic Vitals:   04/07/20 1000  BP: (!) 158/106  Pulse: 84  Resp: 18  Temp: (!) 97.1 F (36.2 C)  SpO2: 95%   Filed Weights   04/07/20 1000  Weight: 142 lb 14.4 oz (64.8 kg)    Physical Exam Constitutional:      General: He is not in acute distress. HENT:     Head: Normocephalic and atraumatic.  Eyes:     General: No scleral icterus. Cardiovascular:     Rate and Rhythm: Normal rate and regular rhythm.     Heart sounds: Normal heart sounds.  Pulmonary:     Effort: Pulmonary effort is normal. No respiratory distress.     Breath sounds: Wheezing present.  Abdominal:     General: Bowel sounds are normal. There is no distension.     Palpations: Abdomen is soft.  Musculoskeletal:        General: No deformity. Normal range of motion.     Cervical back: Normal range of motion and neck supple.  Skin:    General: Skin is warm and dry.     Findings: No erythema or rash.  Neurological:     General: No focal deficit present.     Mental Status: He is alert and oriented to person, place, and time. Mental status is at baseline.     Cranial Nerves: No cranial nerve deficit.     Coordination: Coordination normal.  Psychiatric:        Mood and Affect: Mood normal.      LABORATORY DATA:  I have reviewed the data as listed Lab Results  Component Value Date   WBC 10.2 04/07/2020   HGB 14.4 04/07/2020   HCT 40.5 04/07/2020   MCV 97.4 04/07/2020   PLT 169 04/07/2020   Recent Labs    03/17/20 0830 03/31/20 0825 04/07/20 0854  NA 134*  137 137  K 4.6 3.9 3.7  CL 101 102 104  CO2 20* 23 21*  GLUCOSE 167* 126* 163*  BUN _0 CREATININE 1.19 1.10 1.10  CALCIUM 9.0 9.1 8.8*  GFRNONAA >60 >60 >60  GFRAA >60 >60 >60  PROT 7.7 7.6 7.1  ALBUMIN 3.5 3.5 3.4*  AST 32 29 25  ALT _1 ALKPHOS 86 87 76  BILITOT 0.8 0.7 0.5   Iron/TIBC/Ferritin/ %Sat No results found for: IRON, TIBC, FERRITIN, IRONPCTSAT   RADIOGRAPHIC STUDIES: I have personally reviewed the radiological images as listed and agreed with the findings in the report. DG Chest 2 View  Result Date: 02/04/2020 CLINICAL DATA:  Cough, history of lung cancer EXAM: CHEST - 2 VIEW COMPARISON:  01/15/2019 FINDINGS: Post right upper lobectomy. Interstitial thickening is present likely reflecting scarring/radiation change. Trace right pleural effusion remains present. Stable cardiomediastinal contours with normal heart size. Left chest wall port catheter tip overlies SVC. No acute osseous abnormality. IMPRESSION: Probable postoperative and radiation changes. Trace right pleural effusion. Electronically Signed   By: Macy Mis M.D.   On: 02/04/2020 16:58   CT CHEST W CONTRAST  Result Date: 02/06/2020 CLINICAL DATA:  Lung cancer treated with surgery, chemotherapy and radiation therapy. EXAM: CT CHEST WITH CONTRAST TECHNIQUE: Multidetector CT imaging of the chest was performed during intravenous contrast administration. CONTRAST:  41m OMNIPAQUE IOHEXOL 300 MG/ML  SOLN COMPARISON:  11/21/2019. FINDINGS: Cardiovascular: Left IJ Port-A-Cath terminates in the SVC. Atherosclerotic calcification of the aorta and coronary arteries. Pulmonic trunk is enlarged. Heart size normal.  Small amount of pericardial fluid appears similar. Mediastinum/Nodes: Subcentimeter low-attenuation nodules in the thyroid. No follow-up recommended (ref: J Am Coll Radiol. 2015 Feb;12(2): 143-50).Low internal jugular lymph nodes are not enlarged by CT size criteria. No pathologically enlarged  mediastinal, left hilar or axillary lymph nodes. Evaluation of the right hilum is somewhat limited due to soft tissue thickening and postoperative changes. Esophagus is grossly unremarkable. Lungs/Pleura: Right upper lobectomy with associated pleuroparenchymal scarring and post radiation architectural distortion in the medial right hemithorax. Peribronchovascular nodularity in the right lower lobe is new or increased from the prior exam. In addition to pre-existing stable pulmonary nodules, there are new bilateral pulmonary nodules measuring up to 5 mm in the medial right lower lobe (3/73). Centrilobular emphysema. Tiny right pleural effusion with associated pleural thickening, stable. Airway is otherwise unremarkable. Upper Abdomen: Visualized portions of the liver, gallbladder and adrenal glands are unremarkable. Stones are seen in the kidneys bilaterally. Renal cortical scarring on the right. Subcentimeter low-attenuation lesion in the left kidney is partially imaged. Visualized portions of the kidneys, spleen, pancreas, stomach and bowel are otherwise unremarkable. No upper abdominal adenopathy. Musculoskeletal: Degenerative changes in the spine. No worrisome lytic or sclerotic lesions. IMPRESSION: 1. Postoperative and post radiation changes in the right lung. Scattered new bilateral pulmonary nodules are worrisome for metastatic disease. 2. Chronic small right fibrothorax. 3. Bilateral renal stones. 4. Aortic atherosclerosis (ICD10-I70.0). Coronary artery calcification. 5. Enlarged pulmonic trunk, indicative of pulmonary arterial hypertension. 6.  Emphysema (ICD10-J43.9). Electronically Signed   By: Lorin Picket M.D.   On: 02/06/2020 14:03   DG Fluoro Guide CV Line Left  Result Date: 03/18/2020 INDICATION: No blood return from Port-A-Cath. EXAM: PORT-A-CATH CHECK MEDICATIONS: None. ANESTHESIA/SEDATION: None. FLUOROSCOPY TIME:  Fluoroscopy Time: 1 minutes 6 seconds (22.5 mGy). COMPLICATIONS: None.  PROCEDURE: Standard Port-A-Cath injection performed under fluoroscopic guidance. Nonionic contrast was used. Catheter is intact. Small fibrin sheath noted on the distal aspect of the Port-A-Cath. Flow into the venous system is present. There is noted the right hilum. Density noted over the right hilum IMPRESSION: Small fibrin sheath noted over distal aspect of the Port-A-Cath. Intravenous flow is noted. Phone to the patient's nurse at the time of the study. Electronically Signed   By: Marcello Moores  Register   On: 03/18/2020 11:10     ASSESSMENT & PLAN:  1. Squamous cell carcinoma of lung, right ([place])   2. COPD exacerbation ([place])   3. Hypomagnesemia    #Recurrent Squamous cell carcinoma of lung,  Currently on maintenance immunotherapy with durvalumab every 2 weeks Labs are reviewed and discussed with patient. Physical examination, patient continues to have bilateral wheezing. Hold treatments.  See below. I will obtain CT chest with contrast after he finishes course of prednisone treatment.  Roughly 2 weeks.  #Likely COPD exacerbation  Chronic cough, wheezing, shortness of breath.  Slightly improved after started on Spiriva. I sent patient a prescription of prednisone tapering course.  Hopefully this will improve his symptoms. A hold of antibiotics as of now given that he does not have any signs of infection at this point. #Chronic hypomagnesia.  Continue slow magnesium twice daily.  #Chronic hypokalemia, continue oral potassium supplementation 20 mEq today.   Return of visit: CT scan in 2 weeks and follow-up in the clinic afterwards.   Earlie Server, MD, PhD Hematology Oncology [facility] 04/07/2020

## 2020-04-21 ENCOUNTER — Ambulatory Visit
Admission: RE | Admit: 2020-04-21 | Discharge: 2020-04-21 | Disposition: A | Discharge status: Home / Self Care | Payer: Managed Care, Other (non HMO) | Source: Ambulatory Visit | Attending: Oncology | Admitting: Oncology

## 2020-04-21 ENCOUNTER — Other Ambulatory Visit: Payer: Self-pay

## 2020-04-21 DIAGNOSIS — C3491 Malignant neoplasm of unspecified part of right bronchus or lung: Secondary | ICD-10-CM | POA: Diagnosis not present

## 2020-04-21 MED ORDER — IOHEXOL 300 MG/ML  SOLN
75.0000 mL | Freq: Once | INTRAMUSCULAR | Status: AC | PRN
  Administered 2020-04-21: 75 mL via INTRAVENOUS

## 2020-04-22 ENCOUNTER — Inpatient Hospital Stay: Payer: Managed Care, Other (non HMO) | Attending: Oncology

## 2020-04-22 ENCOUNTER — Encounter: Payer: Self-pay | Admitting: Oncology

## 2020-04-22 ENCOUNTER — Inpatient Hospital Stay: Payer: Managed Care, Other (non HMO)

## 2020-04-22 ENCOUNTER — Inpatient Hospital Stay (HOSPITAL_BASED_OUTPATIENT_CLINIC_OR_DEPARTMENT_OTHER): Payer: Managed Care, Other (non HMO) | Admitting: Oncology

## 2020-04-22 VITALS — BP 146/102 | HR 103 | Temp 98.8°F | Resp 18 | Wt 142.1 lb

## 2020-04-22 DIAGNOSIS — Z87891 Personal history of nicotine dependence: Secondary | ICD-10-CM | POA: Diagnosis not present

## 2020-04-22 DIAGNOSIS — Z7952 Long term (current) use of systemic steroids: Secondary | ICD-10-CM | POA: Diagnosis not present

## 2020-04-22 DIAGNOSIS — C3411 Malignant neoplasm of upper lobe, right bronchus or lung: Secondary | ICD-10-CM | POA: Insufficient documentation

## 2020-04-22 DIAGNOSIS — E876 Hypokalemia: Secondary | ICD-10-CM | POA: Diagnosis not present

## 2020-04-22 DIAGNOSIS — Z833 Family history of diabetes mellitus: Secondary | ICD-10-CM | POA: Insufficient documentation

## 2020-04-22 DIAGNOSIS — C3491 Malignant neoplasm of unspecified part of right bronchus or lung: Secondary | ICD-10-CM

## 2020-04-22 DIAGNOSIS — J449 Chronic obstructive pulmonary disease, unspecified: Secondary | ICD-10-CM | POA: Insufficient documentation

## 2020-04-22 DIAGNOSIS — I10 Essential (primary) hypertension: Secondary | ICD-10-CM | POA: Diagnosis not present

## 2020-04-22 DIAGNOSIS — Z923 Personal history of irradiation: Secondary | ICD-10-CM | POA: Diagnosis not present

## 2020-04-22 DIAGNOSIS — J441 Chronic obstructive pulmonary disease with (acute) exacerbation: Secondary | ICD-10-CM

## 2020-04-22 DIAGNOSIS — Z79899 Other long term (current) drug therapy: Secondary | ICD-10-CM | POA: Diagnosis not present

## 2020-04-22 DIAGNOSIS — Z5112 Encounter for antineoplastic immunotherapy: Secondary | ICD-10-CM

## 2020-04-22 DIAGNOSIS — Z8249 Family history of ischemic heart disease and other diseases of the circulatory system: Secondary | ICD-10-CM | POA: Diagnosis not present

## 2020-04-22 DIAGNOSIS — R059 Cough, unspecified: Secondary | ICD-10-CM

## 2020-04-22 DIAGNOSIS — Z801 Family history of malignant neoplasm of trachea, bronchus and lung: Secondary | ICD-10-CM | POA: Insufficient documentation

## 2020-04-22 DIAGNOSIS — Z803 Family history of malignant neoplasm of breast: Secondary | ICD-10-CM | POA: Insufficient documentation

## 2020-04-22 LAB — CBC WITH DIFFERENTIAL/PLATELET
Abs Immature Granulocytes: 0.07 10*3/uL (ref 0.00–0.07)
Basophils Absolute: 0.1 10*3/uL (ref 0.0–0.1)
Basophils Relative: 1 %
Eosinophils Absolute: 0.4 10*3/uL (ref 0.0–0.5)
Eosinophils Relative: 3 %
HCT: 43.6 % (ref 39.0–52.0)
Hemoglobin: 15.5 g/dL (ref 13.0–17.0)
Immature Granulocytes: 0 %
Lymphocytes Relative: 7 %
Lymphs Abs: 1.1 10*3/uL (ref 0.7–4.0)
MCH: 34.4 pg — ABNORMAL HIGH (ref 26.0–34.0)
MCHC: 35.6 g/dL (ref 30.0–36.0)
MCV: 96.9 fL (ref 80.0–100.0)
Monocytes Absolute: 1 10*3/uL (ref 0.1–1.0)
Monocytes Relative: 6 %
Neutro Abs: 13.2 10*3/uL — ABNORMAL HIGH (ref 1.7–7.7)
Neutrophils Relative %: 83 %
Platelets: 150 10*3/uL (ref 150–400)
RBC: 4.5 MIL/uL (ref 4.22–5.81)
RDW: 12.2 % (ref 11.5–15.5)
WBC: 15.8 10*3/uL — ABNORMAL HIGH (ref 4.0–10.5)
nRBC: 0 % (ref 0.0–0.2)

## 2020-04-22 LAB — COMPREHENSIVE METABOLIC PANEL
ALT: 21 U/L (ref 0–44)
AST: 22 U/L (ref 15–41)
Albumin: 3.7 g/dL (ref 3.5–5.0)
Alkaline Phosphatase: 84 U/L (ref 38–126)
Anion gap: 9 (ref 5–15)
BUN: 10 mg/dL (ref 6–20)
CO2: 24 mmol/L (ref 22–32)
Calcium: 9 mg/dL (ref 8.9–10.3)
Chloride: 102 mmol/L (ref 98–111)
Creatinine, Ser: 1.04 mg/dL (ref 0.61–1.24)
GFR, Estimated: >60 mL/min (ref >60)
Glucose, Bld: 99 mg/dL (ref 70–99)
Potassium: 4.3 mmol/L (ref 3.5–5.1)
Sodium: 135 mmol/L (ref 135–145)
Total Bilirubin: 0.9 mg/dL (ref 0.3–1.2)
Total Protein: 7.6 g/dL (ref 6.5–8.1)

## 2020-04-22 MED ORDER — SODIUM CHLORIDE 0.9% FLUSH
10.0000 mL | INTRAVENOUS | Status: DC | PRN
  Administered 2020-04-22 (×2): 10 mL via INTRAVENOUS
  Filled 2020-04-22: qty 10

## 2020-04-22 MED ORDER — HEPARIN SOD (PORK) LOCK FLUSH 100 UNIT/ML IV SOLN
500.0000 [IU] | Freq: Once | INTRAVENOUS | Status: AC
  Administered 2020-04-22: 500 [IU] via INTRAVENOUS
  Filled 2020-04-22: qty 5

## 2020-04-22 MED ORDER — HEPARIN SOD (PORK) LOCK FLUSH 100 UNIT/ML IV SOLN
INTRAVENOUS | Status: AC
  Filled 2020-04-22: qty 5

## 2020-04-22 MED ORDER — HEPARIN SOD (PORK) LOCK FLUSH 100 UNIT/ML IV SOLN
500.0000 [IU] | Freq: Once | INTRAVENOUS | Status: DC | PRN
  Filled 2020-04-22: qty 5

## 2020-04-22 MED ORDER — SODIUM CHLORIDE 0.9 % IV SOLN
Freq: Once | INTRAVENOUS | Status: AC
  Filled 2020-04-22: qty 250

## 2020-04-22 MED ORDER — BUDESONIDE-FORMOTEROL FUMARATE 80-4.5 MCG/ACT IN AERO: 2.0000 | INHALATION_SPRAY | Freq: Two times a day (BID) | RESPIRATORY_TRACT | 0 refills | Status: DC

## 2020-04-22 MED ORDER — SODIUM CHLORIDE 0.9 % IV SOLN
10.0000 mg/kg | Freq: Once | INTRAVENOUS | Status: AC
  Administered 2020-04-22: 620 mg via INTRAVENOUS
  Filled 2020-04-22: qty 10

## 2020-04-22 NOTE — Progress Notes (Signed)
Pt here for follow up. Reports that cough has improved but would like to know if it will be possible to get a prednisone refill.

## 2020-04-22 NOTE — Progress Notes (Signed)
Hematology/Oncology Follow up note [facility] [phone] [phone]   Patient Care Team: Tracie Harrier, MD as PCP - General (Internal Medicine) Earlie Server, MD as Consulting Physician (Hematology and Oncology)  REASON FOR VISIT:  Follow up for treatment of squamous lung cancer.   HISTORY OF PRESENTING ILLNESS:  # Dec 2019 Stage I squamous lung cancer #s/p Bronchoscopy on 06/25/2018. subcarina EBUS FNA was non diagnostic, hypocellular specimen.  # 08/13/2018 CT guided right upper lobe biopsy pathology showed dense fibrosis and mixed inflammatory cells with prominent polytypic plasma cells component. Focal benign bronchial wall and alveolar spaces. No malignancy was identified.   # 08/13/2018 underwent right thoracotomy and wedge resection of a right upper lobe mass.  Frozen section was consistent with an inflammatory process.  On the second postop day, preliminary pathology reports high-grade malignancy.  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  08/20/2018 Patient was therefore taken back to the OR and underwent take complete lobectomy  pT1b pN0 cM0 stage I squamous cell lung cancer.  Margin is negative.  Recommend observation.    # 12/03/2018 CT chest w contrast showed local recurrence.  12/10/2018 PET showed No evidence of distant metastatic disease # 12/26/2018 s/p bronchoscopy biopsy. Confirmed local recurrence of squamous cell lung cancer.  medi port placed by Dr.Oaks.  Cancer treatment Started concurrent chemoradiation on 01/16/2019 Carboplatin AUC of 2 and Taxol 45 mg/m2 weekly finished in 03/05/2019 04/10/2019, patient started on durvalumab maintenance.  INTERVAL HISTORY Samuel Choi is a 54 y.o. male who has above history reviewed by me today presents for evaluation prior to immunotherapy for treatment of  recurrent  squamous cell lung cancer. Cough has improved after he finishes the prednisone taper. Still has some cough episodes  at night. He requests another course of prednisone. No new complaints. SOB is better.   Review of Systems  Constitutional: Negative for appetite change, chills, diaphoresis, fatigue, fever and unexpected weight change.  HENT:   Negative for hearing loss, lump/mass, nosebleeds, sore throat and voice change.   Eyes: Negative for eye problems and icterus.  Respiratory: Positive for cough. Negative for chest tightness, hemoptysis, shortness of breath and wheezing.   Cardiovascular: Negative for chest pain and leg swelling.  Gastrointestinal: Negative for abdominal distention, abdominal pain, blood in stool, diarrhea, nausea and rectal pain.  Endocrine: Negative for hot flashes.  Genitourinary: Negative for bladder incontinence, difficulty urinating, dysuria, frequency, hematuria and nocturia.   Musculoskeletal: Negative for arthralgias, back pain, flank pain, gait problem and myalgias.  Skin: Negative for itching and rash.  Neurological: Negative for dizziness, gait problem, headaches, light-headedness, numbness and seizures.  Hematological: Negative for adenopathy. Does not bruise/bleed easily.  Psychiatric/Behavioral: Negative for confusion and decreased concentration. The patient is not nervous/anxious.     MEDICAL HISTORY:  Past Medical History:  Diagnosis Date  . Alcohol abuse    usually drinks 2-3 drinks per day  . Atherosclerosis 06/2018  . Chronic sinusitis   . Dehydration 02/07/2019  . Emphysema of lung ([place]) 06/2018   patient unaware of this.  . Hip fracture ([place]) 05/2018   no surgery  . History of kidney stones 05/2018   per xray, bilateral nephrolitiasis  . Hypertension   . Squamous cell carcinoma of lung, right ([place]) 06/2018    SURGICAL HISTORY: Past Surgical History:  Procedure Laterality Date  . BRAIN SURGERY  10/2017   nasal/sinus endoscopy. mass benign  . ELECTROMAGNETIC NAVIGATION BROCHOSCOPY Right 06/25/2018   Procedure: ELECTROMAGNETIC NAVIGATION BRONCHOSCOPY;  Surgeon: [name], MD;  Location: [facility] OR[facility];  Service: Cardiopulmonary;  Laterality: Right;  . ENDOBRONCHIAL ULTRASOUND Right 06/25/2018   Procedure: ENDOBRONCHIAL ULTRASOUND;  Surgeon: [name], MD;  Location: [facility];  Service: Cardiopulmonary;  Laterality: Right;  . ENDOBRONCHIAL ULTRASOUND Right 12/26/2018   Procedure: ENDOBRONCHIAL ULTRASOUND RIGHT;  Surgeon: [name], MD;  Location: [facility];  Service: Cardiopulmonary;  Laterality: Right;  . FLEXIBLE BRONCHOSCOPY N/A 08/20/2018   Procedure: FLEXIBLE BRONCHOSCOPY PREOP;  Surgeon: [name], MD;  Location: [facility] OR[facility];  Service: Thoracic;  Laterality: N/A;  . IR CV LINE INJECTION  04/04/2019  . NASAL SINUS SURGERY  10/2017   At [facility], frontal sinusotomy, ethmoidectomy, resection anterior cranial fossa neoplasm, turbinate resection  . PORTACATH PLACEMENT Left 01/15/2019   Procedure: INSERTION PORT-A-CATH;  Surgeon: [name], MD;  Location: [facility];  Service: General;  Laterality: Left;  . THORACOTOMY Right 08/13/2018   Procedure: PREOP BROCHOSCOPY WITH RIGHT THORACOTOMY AND RUL RESECTION;  Surgeon: [name], MD;  Location: [facility];  Service: General;  Laterality: Right;  . THORACOTOMY Right 08/20/2018   Procedure: THORACOTOMY MAJOR RIGHT UPPER LOBE LOBECTOMY;  Surgeon: [name], MD;  Location: [facility];  Service: Thoracic;  Laterality: Right;  . TOE SURGERY Left    pin in left toe    SOCIAL HISTORY: Social History   Socioeconomic History  . Marital status: Married    Spouse name: lisa  . Number of children: Not on file  . Years of education: Not on file  . Highest education level: Not on file  Occupational History  . Occupation: welding    Comment: taking time off to resolve issues  Tobacco Use  . Smoking status: Former Smoker    Packs/day: 0.50    Years: 15.00    Pack years: 7.50    Types: Cigarettes    Quit date: 08/2018    Years since quitting: 1.7  . Smokeless tobacco: Never Used  Vaping Use  .  Vaping Use: Never used  Substance and Sexual Activity  . Alcohol use: Yes    Alcohol/week: 3.0 standard drinks    Types: 3 Cans of beer per week    Comment: usually 2 drinks per week, per patient  . Drug use: No  . Sexual activity: Not on file  Other Topics Concern  . Not on file  Social History Narrative  . Not on file   Social Determinants of Health   Financial Resource Strain:   . Difficulty of Paying Living Expenses: Not on file  Food Insecurity:   . Worried About Charity fundraiser in the Last Year: Not on file  . Ran Out of Food in the Last Year: Not on file  Transportation Needs:   . Lack of Transportation (Medical): Not on file  . Lack of Transportation (Non-Medical): Not on file  Physical Activity:   . Days of Exercise per Week: Not on file  . Minutes of Exercise per Session: Not on file  Stress:   . Feeling of Stress : Not on file  Social Connections:   . Frequency of Communication with Friends and Family: Not on file  . Frequency of Social Gatherings with Friends and Family: Not on file  . Attends Religious Services: Not on file  . Active Member of Clubs or Organizations: Not on file  . Attends Archivist Meetings: Not on file  . Marital Status: Not on file  Intimate Partner Violence:   . Fear of Current or Ex-Partner:  Not on file  . Emotionally Abused: Not on file  . Physically Abused: Not on file  . Sexually Abused: Not on file    FAMILY HISTORY: Family History  Problem Relation Age of Onset  . Breast cancer Mother   . Diabetes Mother   . Lung cancer Father   . Hypertension Father     ALLERGIES:  has No Known Allergies.  MEDICATIONS:  Current Outpatient Medications  Medication Sig Dispense Refill  . SPIRIVA HANDIHALER 18 MCG inhalation capsule INHALE 1 CAPSULE VIA HANDIHALER ONCE DAILY AT THE SAME TIME EVERY DAY 30 capsule 0  . albuterol (VENTOLIN HFA) 108 (90 Base) MCG/ACT inhaler TAKE 2 PUFFS BY MOUTH EVERY 6 HOURS AS NEEDED FOR  WHEEZE OR SHORTNESS OF BREATH 6.7 each 6  . cloNIDine (CATAPRES) 0.1 MG tablet Take 0.1 mg by mouth daily.   5  . diltiazem (CARDIZEM CD) 120 MG 24 hr capsule Take 120 mg by mouth daily.    . folic acid (V-R FOLIC ACID) 536 MCG tablet Take 1 tablet (400 mcg total) by mouth daily. 90 tablet 1  . hydrALAZINE (APRESOLINE) 100 MG tablet Take 50 mg by mouth 2 (two) times daily.     . hydrochlorothiazide (HYDRODIURIL) 12.5 MG tablet Take 12.5 mg by mouth daily.     Marland Kitchen KLOR-CON M20 20 MEQ tablet TAKE 1 TABLET BY MOUTH TWICE A DAY 60 tablet 0  . latanoprost (XALATAN) 0.005 % ophthalmic solution 1 drop at bedtime.    . lidocaine-prilocaine (EMLA) cream Apply to affected area once 30 g 3  . magnesium chloride (SLOW-MAG) 64 MG TBEC SR tablet Take 1 tablet (64 mg total) by mouth 2 (two) times daily. TAKE 1 TABLET (64 MG TOTAL) BY MOUTH DAILY. 60 tablet 1  . methocarbamol (ROBAXIN) 500 MG tablet Take 1 tablet (500 mg total) by mouth at bedtime. 30 tablet 0  . olmesartan (BENICAR) 40 MG tablet Take 40 mg by mouth every other day.     . ondansetron (ZOFRAN) 8 MG tablet Take 1 tablet (8 mg total) by mouth 2 (two) times daily as needed for refractory nausea / vomiting. Start on day 3 after chemo. 30 tablet 1  . predniSONE (STERAPRED UNI-PAK 21 TAB) 10 MG (21) TBPK tablet Day 1 take 6 tablets, Day 2 take 5 tablets.  Day 3 take 4 tablets.  Day 4 take 3 tablets Day 5 take 2 tablets.  Day 6 take 1 tablet and stop. 21 tablet 0  . prochlorperazine (COMPAZINE) 10 MG tablet Take 1 tablet (10 mg total) by mouth every 6 (six) hours as needed (Nausea or vomiting). 30 tablet 1  . vitamin B-12 (CYANOCOBALAMIN) 1000 MCG tablet Take 1 tablet (1,000 mcg total) by mouth daily. 90 tablet 1   No current facility-administered medications for this visit.   Facility-Administered Medications Ordered in Other Visits  Medication Dose Route Frequency Provider Last Rate Last Admin  . albuterol (PROVENTIL) (2.5 MG/3ML) 0.083% nebulizer  solution 2.5 mg  2.5 mg Nebulization Once System, Provider Not In      . heparin lock flush 100 unit/mL  500 Units Intravenous Once Earlie Server, MD      . heparin lock flush 100 unit/mL  500 Units Intravenous Once Earlie Server, MD      . sodium chloride flush (NS) 0.9 % injection 10 mL  10 mL Intravenous PRN Earlie Server, MD   10 mL at 04/04/19 0903  . sodium chloride flush (NS) 0.9 % injection 10 mL  10 mL Intravenous PRN Earlie Server, MD         PHYSICAL EXAMINATION: ECOG PERFORMANCE STATUS: 0 - Asymptomatic Vitals:   04/22/20 0834 04/22/20 0904  BP: (!) 155/103 (!) 146/102  Pulse: (!) 102 (!) 103  Resp: 18   Temp: 98.8 F (37.1 C)   SpO2: 99%    Filed Weights   04/22/20 0834  Weight: 142 lb 1.6 oz (64.5 kg)    Physical Exam Constitutional:      General: He is not in acute distress. HENT:     Head: Normocephalic and atraumatic.  Eyes:     General: No scleral icterus. Cardiovascular:     Rate and Rhythm: Normal rate and regular rhythm.     Heart sounds: Normal heart sounds.  Pulmonary:     Effort: Pulmonary effort is normal. No respiratory distress.     Breath sounds: No wheezing.  Abdominal:     General: Bowel sounds are normal. There is no distension.     Palpations: Abdomen is soft.  Musculoskeletal:        General: No deformity. Normal range of motion.     Cervical back: Normal range of motion and neck supple.  Skin:    General: Skin is warm and dry.     Findings: No erythema or rash.  Neurological:     Mental Status: He is alert and oriented to person, place, and time. Mental status is at baseline.     Cranial Nerves: No cranial nerve deficit.     Coordination: Coordination normal.  Psychiatric:        Mood and Affect: Mood normal.      LABORATORY DATA:  I have reviewed the data as listed Lab Results  Component Value Date   WBC 10.2 04/07/2020   HGB 14.4 04/07/2020   HCT 40.5 04/07/2020   MCV 97.4 04/07/2020   PLT 169 04/07/2020   Recent Labs     03/17/20 0830 03/31/20 0825 04/07/20 0854  NA 134* 137 137  K 4.6 3.9 3.7  CL 101 102 104  CO2 20* 23 21*  GLUCOSE 167* 126* 163*  BUN _0 CREATININE 1.19 1.10 1.10  CALCIUM 9.0 9.1 8.8*  GFRNONAA >60 >60 >60  GFRAA >60 >60 >60  PROT 7.7 7.6 7.1  ALBUMIN 3.5 3.5 3.4*  AST 32 29 25  ALT _1 ALKPHOS 86 87 76  BILITOT 0.8 0.7 0.5   Iron/TIBC/Ferritin/ %Sat No results found for: IRON, TIBC, FERRITIN, IRONPCTSAT   RADIOGRAPHIC STUDIES: I have personally reviewed the radiological images as listed and agreed with the findings in the report. DG Chest 2 View  Result Date: 02/04/2020 CLINICAL DATA:  Cough, history of lung cancer EXAM: CHEST - 2 VIEW COMPARISON:  01/15/2019 FINDINGS: Post right upper lobectomy. Interstitial thickening is present likely reflecting scarring/radiation change. Trace right pleural effusion remains present. Stable cardiomediastinal contours with normal heart size. Left chest wall port catheter tip overlies SVC. No acute osseous abnormality. IMPRESSION: Probable postoperative and radiation changes. Trace right pleural effusion. Electronically Signed   By: Macy Mis M.D.   On: 02/04/2020 16:58   CT Chest W Contrast  Result Date: 04/21/2020 CLINICAL DATA:  Lung cancer follow-up, squamous cell carcinoma diagnosed 2 years ago. EXAM: CT CHEST WITH CONTRAST TECHNIQUE: Multidetector CT imaging of the chest was performed during intravenous contrast administration. CONTRAST:  61m OMNIPAQUE IOHEXOL 300 MG/ML  SOLN COMPARISON:  February 06, 2020 FINDINGS: Cardiovascular: LEFT-sided Port-A-Cath terminates at  the midportion to distal portion of superior vena cava. Heart size is normal. Trace pericardial fluid similar to the prior study. Central pulmonary vasculature is distorted at the RIGHT hilum as before otherwise unremarkable on venous phase assessment. Mediastinum/Nodes: Thoracic inlet structures are normal. No axillary lymphadenopathy. No mediastinal  adenopathy. Esophagus grossly normal. Lungs/Pleura: Emphysema. Postoperative changes of RIGHT upper lobectomy. No dense consolidation. Linear pattern of septal thickening extends through the RIGHT mid chest compatible with prior radiotherapy. Scattered nodularity is unchanged. Micro nodules grouped in the anterior LEFT upper lobe with similar appearance. Enlarging peripheral LEFT upper lobe nodules however since the prior study (image 55 of series 3) with a 5 mm nodule that previously was quite indistinct and at best 2 mm. Similar appearance and change in a small 3 mm nodule in the anterior LEFT upper lobe laterally. Scattered millimetric nodules throughout the LEFT chest including a nother small nodule that is approximately 3-4 mm on image 125 of series 3 and at least 4 others that show a similar appearance at the periphery of the LEFT lower lobe on images 112 through 128. No effusion. New area of septal nodularity in the RIGHT lung base on image 110 of series 3 measures 4-5 mm Upper Abdomen: Imaged portions of liver, spleen, pancreas and gastrointestinal tract are unremarkable. The adrenal glands are normal. Nephrolithiasis as before. Symmetric renal enhancement. Musculoskeletal: No acute bone finding. No destructive bone process. Spinal degenerative changes. IMPRESSION: Increasingly nodular appearance of post treatment changes in the RIGHT chest albeit subtle since the previous study with scattered pulmonary nodules throughout the chest many of these nodules removed from post treatment changes are new since February of 2021 and suspicious for metastatic disease. Aortic atherosclerosis. Aortic Atherosclerosis (ICD10-I70.0). Electronically Signed   By: Zetta Bills M.D.   On: 04/21/2020 17:03   CT CHEST W CONTRAST  Result Date: 02/06/2020 CLINICAL DATA:  Lung cancer treated with surgery, chemotherapy and radiation therapy. EXAM: CT CHEST WITH CONTRAST TECHNIQUE: Multidetector CT imaging of the chest was  performed during intravenous contrast administration. CONTRAST:  62m OMNIPAQUE IOHEXOL 300 MG/ML  SOLN COMPARISON:  11/21/2019. FINDINGS: Cardiovascular: Left IJ Port-A-Cath terminates in the SVC. Atherosclerotic calcification of the aorta and coronary arteries. Pulmonic trunk is enlarged. Heart size normal. Small amount of pericardial fluid appears similar. Mediastinum/Nodes: Subcentimeter low-attenuation nodules in the thyroid. No follow-up recommended (ref: J Am Coll Radiol. 2015 Feb;12(2): 143-50).Low internal jugular lymph nodes are not enlarged by CT size criteria. No pathologically enlarged mediastinal, left hilar or axillary lymph nodes. Evaluation of the right hilum is somewhat limited due to soft tissue thickening and postoperative changes. Esophagus is grossly unremarkable. Lungs/Pleura: Right upper lobectomy with associated pleuroparenchymal scarring and post radiation architectural distortion in the medial right hemithorax. Peribronchovascular nodularity in the right lower lobe is new or increased from the prior exam. In addition to pre-existing stable pulmonary nodules, there are new bilateral pulmonary nodules measuring up to 5 mm in the medial right lower lobe (3/73). Centrilobular emphysema. Tiny right pleural effusion with associated pleural thickening, stable. Airway is otherwise unremarkable. Upper Abdomen: Visualized portions of the liver, gallbladder and adrenal glands are unremarkable. Stones are seen in the kidneys bilaterally. Renal cortical scarring on the right. Subcentimeter low-attenuation lesion in the left kidney is partially imaged. Visualized portions of the kidneys, spleen, pancreas, stomach and bowel are otherwise unremarkable. No upper abdominal adenopathy. Musculoskeletal: Degenerative changes in the spine. No worrisome lytic or sclerotic lesions. IMPRESSION: 1. Postoperative and post radiation changes in  the right lung. Scattered new bilateral pulmonary nodules are worrisome  for metastatic disease. 2. Chronic small right fibrothorax. 3. Bilateral renal stones. 4. Aortic atherosclerosis (ICD10-I70.0). Coronary artery calcification. 5. Enlarged pulmonic trunk, indicative of pulmonary arterial hypertension. 6.  Emphysema (ICD10-J43.9). Electronically Signed   By: Lorin Picket M.D.   On: 02/06/2020 14:03   DG Fluoro Guide CV Line Left  Result Date: 03/18/2020 INDICATION: No blood return from Port-A-Cath. EXAM: PORT-A-CATH CHECK MEDICATIONS: None. ANESTHESIA/SEDATION: None. FLUOROSCOPY TIME:  Fluoroscopy Time: 1 minutes 6 seconds (22.5 mGy). COMPLICATIONS: None. PROCEDURE: Standard Port-A-Cath injection performed under fluoroscopic guidance. Nonionic contrast was used. Catheter is intact. Small fibrin sheath noted on the distal aspect of the Port-A-Cath. Flow into the venous system is present. There is noted the right hilum. Density noted over the right hilum IMPRESSION: Small fibrin sheath noted over distal aspect of the Port-A-Cath. Intravenous flow is noted. Phone to the patient's nurse at the time of the study. Electronically Signed   By: Marcello Moores  Register   On: 03/18/2020 11:10     ASSESSMENT & PLAN:  1. Squamous cell carcinoma of lung, right ([place])   2. Encounter for antineoplastic immunotherapy   3. COPD exacerbation ([place])   4. Hypomagnesemia   5. Hypokalemia    #Recurrent Squamous cell carcinoma of lung,  Currently on maintenance immunotherapy with durvalumab every 2 weeks Labs are reviewed and discussed with patient. Interval CT chest was reviewed and discussed with patient.  Increasingly nodular appearance of post treatment changes in the RIGHT chest albeit subtle since the previous study with scattered pulmonary nodules throughout the chest many of these nodules are new since February of 2021 and suspicious for metastatic disease. Clinically he just recovered from an episode of COPD exacerbation/bronchitis and symptoms have improved after prednisone. And these  new nodules can be due to recent infection/inflammation, vs new mets. Too small to biopsy or show up on PET I recommend him to continue current therapy and repeat CT in 2 months as short term follow up.   #COPD exacerbation symptoms are improved with a course of prednisone, on Spiriva. He feels that symptoms are not all gone yet and prefers to have another course of prednisone.  Discussed with him that I will recommend him to try Symbicort inhaler+/- Spiriva  and see if symptoms get better.  Recommend him to re-establish care with his pulmonologist.  #Chronic hypomagnesia.  Continue slow magnesium twice daily.  #Chronic hypokalemia, continue oral potassium supplementation 20 mEq today.   Return of visit: 2 weeks.    Earlie Server, MD, PhD Hematology Oncology [facility] 04/22/2020

## 2020-04-22 NOTE — Progress Notes (Signed)
OK for durvalumab today with BP 146/102 per Dr. Tasia Catchings.

## 2020-05-06 ENCOUNTER — Inpatient Hospital Stay: Payer: Managed Care, Other (non HMO)

## 2020-05-06 ENCOUNTER — Inpatient Hospital Stay (HOSPITAL_BASED_OUTPATIENT_CLINIC_OR_DEPARTMENT_OTHER): Payer: Managed Care, Other (non HMO) | Admitting: Oncology

## 2020-05-06 ENCOUNTER — Encounter: Payer: Self-pay | Admitting: Oncology

## 2020-05-06 VITALS — BP 153/75 | HR 94 | Temp 97.0°F | Resp 16 | Wt 145.5 lb

## 2020-05-06 DIAGNOSIS — C3491 Malignant neoplasm of unspecified part of right bronchus or lung: Secondary | ICD-10-CM | POA: Diagnosis not present

## 2020-05-06 DIAGNOSIS — Z5112 Encounter for antineoplastic immunotherapy: Secondary | ICD-10-CM

## 2020-05-06 DIAGNOSIS — E876 Hypokalemia: Secondary | ICD-10-CM | POA: Diagnosis not present

## 2020-05-06 DIAGNOSIS — R059 Cough, unspecified: Secondary | ICD-10-CM

## 2020-05-06 LAB — CBC WITH DIFFERENTIAL/PLATELET
Abs Immature Granulocytes: 0.02 10*3/uL (ref 0.00–0.07)
Basophils Absolute: 0 10*3/uL (ref 0.0–0.1)
Basophils Relative: 1 %
Eosinophils Absolute: 0.2 10*3/uL (ref 0.0–0.5)
Eosinophils Relative: 3 %
HCT: 44.3 % (ref 39.0–52.0)
Hemoglobin: 15.6 g/dL (ref 13.0–17.0)
Immature Granulocytes: 0 %
Lymphocytes Relative: 16 %
Lymphs Abs: 1.2 10*3/uL (ref 0.7–4.0)
MCH: 34.8 pg — ABNORMAL HIGH (ref 26.0–34.0)
MCHC: 35.2 g/dL (ref 30.0–36.0)
MCV: 98.9 fL (ref 80.0–100.0)
Monocytes Absolute: 0.7 10*3/uL (ref 0.1–1.0)
Monocytes Relative: 9 %
Neutro Abs: 5.1 10*3/uL (ref 1.7–7.7)
Neutrophils Relative %: 71 %
Platelets: 169 10*3/uL (ref 150–400)
RBC: 4.48 MIL/uL (ref 4.22–5.81)
RDW: 13.5 % (ref 11.5–15.5)
WBC: 7.2 10*3/uL (ref 4.0–10.5)
nRBC: 0.3 % — ABNORMAL HIGH (ref 0.0–0.2)

## 2020-05-06 LAB — COMPREHENSIVE METABOLIC PANEL
ALT: 34 U/L (ref 0–44)
AST: 38 U/L (ref 15–41)
Albumin: 3.5 g/dL (ref 3.5–5.0)
Alkaline Phosphatase: 110 U/L (ref 38–126)
Anion gap: 13 (ref 5–15)
BUN: 14 mg/dL (ref 6–20)
CO2: 22 mmol/L (ref 22–32)
Calcium: 8.8 mg/dL — ABNORMAL LOW (ref 8.9–10.3)
Chloride: 105 mmol/L (ref 98–111)
Creatinine, Ser: 0.89 mg/dL (ref 0.61–1.24)
GFR, Estimated: >60 mL/min (ref >60)
Glucose, Bld: 98 mg/dL (ref 70–99)
Potassium: 3.5 mmol/L (ref 3.5–5.1)
Sodium: 140 mmol/L (ref 135–145)
Total Bilirubin: 0.6 mg/dL (ref 0.3–1.2)
Total Protein: 7.5 g/dL (ref 6.5–8.1)

## 2020-05-06 MED ORDER — HEPARIN SOD (PORK) LOCK FLUSH 100 UNIT/ML IV SOLN
500.0000 [IU] | Freq: Once | INTRAVENOUS | Status: AC | PRN
  Administered 2020-05-06: 500 [IU]
  Filled 2020-05-06: qty 5

## 2020-05-06 MED ORDER — SODIUM CHLORIDE 0.9 % IV SOLN
Freq: Once | INTRAVENOUS | Status: AC
  Filled 2020-05-06: qty 250

## 2020-05-06 MED ORDER — SODIUM CHLORIDE 0.9 % IV SOLN
10.0000 mg/kg | Freq: Once | INTRAVENOUS | Status: AC
  Administered 2020-05-06: 620 mg via INTRAVENOUS
  Filled 2020-05-06: qty 10

## 2020-05-06 MED ORDER — HEPARIN SOD (PORK) LOCK FLUSH 100 UNIT/ML IV SOLN
INTRAVENOUS | Status: AC
  Filled 2020-05-06: qty 5

## 2020-05-06 MED ORDER — ALTEPLASE 2 MG IJ SOLR
2.0000 mg | Freq: Once | INTRAMUSCULAR | Status: AC | PRN
  Administered 2020-05-06: 2 mg
  Filled 2020-05-06: qty 2

## 2020-05-06 NOTE — Progress Notes (Signed)
Patient denies new problems/concerns today.   °

## 2020-05-06 NOTE — Progress Notes (Signed)
Pt evaluated by MD in clinic.  Pt port does not have blood return.  Pt had dye flow study 03/18/2020 and cathflo 03/31/2020 with positive results. Per MD- administer Cathflo 2 mg per policy. After 30 mins- pt had positive blood flow return. Treatment given as ordered. Completed without incident. Pt discharged stable. Return as scheduled.

## 2020-05-06 NOTE — Progress Notes (Signed)
Hematology/Oncology Follow up note [facility] [phone] [phone]) [phone]   Patient Care Team: Tracie Harrier, MD as PCP - General (Internal Medicine) Earlie Server, MD as Consulting Physician (Hematology and Oncology)  REASON FOR VISIT:  Follow up for treatment of squamous lung cancer.   HISTORY OF PRESENTING ILLNESS:  # Dec 2019 Stage I squamous lung cancer #s/p Bronchoscopy on 06/25/2018. subcarina EBUS FNA was non diagnostic, hypocellular specimen.  # 08/13/2018 CT guided right upper lobe biopsy pathology showed dense fibrosis and mixed inflammatory cells with prominent polytypic plasma cells component. Focal benign bronchial wall and alveolar spaces. No malignancy was identified.   # 08/13/2018 underwent right thoracotomy and wedge resection of a right upper lobe mass.  Frozen section was consistent with an inflammatory process.  On the second postop day, preliminary pathology reports high-grade malignancy.  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  08/20/2018 Patient was therefore taken back to the OR and underwent take complete lobectomy  pT1b pN0 cM0 stage I squamous cell lung cancer.  Margin is negative.  Recommend observation.    # 12/03/2018 CT chest w contrast showed local recurrence.  12/10/2018 PET showed No evidence of distant metastatic disease # 12/26/2018 s/p bronchoscopy biopsy. Confirmed local recurrence of squamous cell lung cancer.  medi port placed by Dr.Oaks.  Cancer treatment Started concurrent chemoradiation on 01/16/2019 Carboplatin AUC of 2 and Taxol 45 mg/m2 weekly finished in 03/05/2019 04/10/2019, patient started on durvalumab maintenance.  INTERVAL HISTORY Samuel Choi is a 54 y.o. male who has above history reviewed by me today presents for evaluation prior to immunotherapy for treatment of  recurrent  squamous cell lung cancer. Cough has improved.  No new complaints.   Review of Systems  Constitutional:  Positive for fatigue. Negative for appetite change, chills, fever and unexpected weight change.  HENT:   Negative for hearing loss and voice change.   Eyes: Negative for eye problems and icterus.  Respiratory: Negative for chest tightness, cough and shortness of breath.   Cardiovascular: Negative for chest pain and leg swelling.  Gastrointestinal: Negative for abdominal distention and abdominal pain.  Endocrine: Negative for hot flashes.  Genitourinary: Negative for difficulty urinating, dysuria and frequency.   Musculoskeletal: Negative for arthralgias.  Skin: Negative for itching and rash.  Neurological: Negative for light-headedness and numbness.  Hematological: Negative for adenopathy. Does not bruise/bleed easily.  Psychiatric/Behavioral: Negative for confusion.    MEDICAL HISTORY:  Past Medical History:  Diagnosis Date  . Alcohol abuse    usually drinks 2-3 drinks per day  . Atherosclerosis 06/2018  . Chronic sinusitis   . Dehydration 02/07/2019  . Emphysema of lung ([place]) 06/2018   patient unaware of this.  . Hip fracture ([place]) 05/2018   no surgery  . History of kidney stones 05/2018   per xray, bilateral nephrolitiasis  . Hypertension   . Squamous cell carcinoma of lung, right ([place]) 06/2018    SURGICAL HISTORY: Past Surgical History:  Procedure Laterality Date  . BRAIN SURGERY  10/2017   nasal/sinus endoscopy. mass benign  . ELECTROMAGNETIC NAVIGATION BROCHOSCOPY Right 06/25/2018   Procedure: ELECTROMAGNETIC NAVIGATION BRONCHOSCOPY;  Surgeon: [name], MD;  Location: [facility] ORS;  Service: Cardiopulmonary;  Laterality: Right;  . ENDOBRONCHIAL ULTRASOUND Right 06/25/2018   Procedure: ENDOBRONCHIAL ULTRASOUND;  Surgeon: [name], MD;  Location: [facility] ORS;  Service: Cardiopulmonary;  Laterality: Right;  . ENDOBRONCHIAL ULTRASOUND Right 12/26/2018   Procedure: ENDOBRONCHIAL ULTRASOUND RIGHT;  Surgeon: [name], MD;  Location: [facility]  ORS;  Service: Cardiopulmonary;   Laterality: Right;  . FLEXIBLE BRONCHOSCOPY N/A 08/20/2018   Procedure: FLEXIBLE BRONCHOSCOPY PREOP;  Surgeon: [name], MD;  Location: [facility] ORS;  Service: Thoracic;  Laterality: N/A;  . IR CV LINE INJECTION  04/04/2019  . NASAL SINUS SURGERY  10/2017   At [facility], frontal sinusotomy, ethmoidectomy, resection anterior cranial fossa neoplasm, turbinate resection  . PORTACATH PLACEMENT Left 01/15/2019   Procedure: INSERTION PORT-A-CATH;  Surgeon: [name], MD;  Location: [facility] ORS;  Service: General;  Laterality: Left;  . THORACOTOMY Right 08/13/2018   Procedure: PREOP BROCHOSCOPY WITH RIGHT THORACOTOMY AND RUL RESECTION;  Surgeon: [name], MD;  Location: [facility] ORS;  Service: General;  Laterality: Right;  . THORACOTOMY Right 08/20/2018   Procedure: THORACOTOMY MAJOR RIGHT UPPER LOBE LOBECTOMY;  Surgeon: [name], MD;  Location: [facility] ORS;  Service: Thoracic;  Laterality: Right;  . TOE SURGERY Left    pin in left toe    SOCIAL HISTORY: Social History   Socioeconomic History  . Marital status: Married    Spouse name: lisa  . Number of children: Not on file  . Years of education: Not on file  . Highest education level: Not on file  Occupational History  . Occupation: welding    Comment: taking time off to resolve issues  Tobacco Use  . Smoking status: Former Smoker    Packs/day: 0.50    Years: 15.00    Pack years: 7.50    Types: Cigarettes    Quit date: 08/2018    Years since quitting: 1.7  . Smokeless tobacco: Never Used  Vaping Use  . Vaping Use: Never used  Substance and Sexual Activity  . Alcohol use: Yes    Alcohol/week: 3.0 standard drinks    Types: 3 Cans of beer per week    Comment: usually 2 drinks per week, per patient  . Drug use: No  . Sexual activity: Not on file  Other Topics Concern  . Not on file  Social History Narrative  . Not on file   Social Determinants of Health   Financial Resource Strain:   . Difficulty of Paying Living Expenses: Not on  file  Food Insecurity:   . Worried About Charity fundraiser in the Last Year: Not on file  . Ran Out of Food in the Last Year: Not on file  Transportation Needs:   . Lack of Transportation (Medical): Not on file  . Lack of Transportation (Non-Medical): Not on file  Physical Activity:   . Days of Exercise per Week: Not on file  . Minutes of Exercise per Session: Not on file  Stress:   . Feeling of Stress : Not on file  Social Connections:   . Frequency of Communication with Friends and Family: Not on file  . Frequency of Social Gatherings with Friends and Family: Not on file  . Attends Religious Services: Not on file  . Active Member of Clubs or Organizations: Not on file  . Attends Archivist Meetings: Not on file  . Marital Status: Not on file  Intimate Partner Violence:   . Fear of Current or Ex-Partner: Not on file  . Emotionally Abused: Not on file  . Physically Abused: Not on file  . Sexually Abused: Not on file    FAMILY HISTORY: Family History  Problem Relation Age of Onset  . Breast cancer Mother   . Diabetes Mother   . Lung cancer Father   . Hypertension Father  ALLERGIES:  has No Known Allergies.  MEDICATIONS:  Current Outpatient Medications  Medication Sig Dispense Refill  . albuterol (VENTOLIN HFA) 108 (90 Base) MCG/ACT inhaler TAKE 2 PUFFS BY MOUTH EVERY 6 HOURS AS NEEDED FOR WHEEZE OR SHORTNESS OF BREATH 6.7 each 6  . budesonide-formoterol (SYMBICORT) 80-4.5 MCG/ACT inhaler Inhale 2 puffs into the lungs in the morning and at bedtime. 1 each 0  . cloNIDine (CATAPRES) 0.1 MG tablet Take 0.1 mg by mouth daily.   5  . diltiazem (CARDIZEM CD) 120 MG 24 hr capsule Take 120 mg by mouth daily.    . folic acid (V-R FOLIC ACID) 993 MCG tablet Take 1 tablet (400 mcg total) by mouth daily. 90 tablet 1  . hydrALAZINE (APRESOLINE) 100 MG tablet Take 50 mg by mouth 2 (two) times daily.     . hydrochlorothiazide (HYDRODIURIL) 12.5 MG tablet Take 12.5 mg by  mouth daily.     Marland Kitchen KLOR-CON M20 20 MEQ tablet TAKE 1 TABLET BY MOUTH TWICE A DAY 60 tablet 0  . latanoprost (XALATAN) 0.005 % ophthalmic solution 1 drop at bedtime.    . lidocaine-prilocaine (EMLA) cream Apply to affected area once 30 g 3  . magnesium chloride (SLOW-MAG) 64 MG TBEC SR tablet Take 1 tablet (64 mg total) by mouth 2 (two) times daily. TAKE 1 TABLET (64 MG TOTAL) BY MOUTH DAILY. 60 tablet 1  . methocarbamol (ROBAXIN) 500 MG tablet Take 1 tablet (500 mg total) by mouth at bedtime. 30 tablet 0  . olmesartan (BENICAR) 40 MG tablet Take 40 mg by mouth every other day.     . ondansetron (ZOFRAN) 8 MG tablet Take 1 tablet (8 mg total) by mouth 2 (two) times daily as needed for refractory nausea / vomiting. Start on day 3 after chemo. 30 tablet 1  . prochlorperazine (COMPAZINE) 10 MG tablet Take 1 tablet (10 mg total) by mouth every 6 (six) hours as needed (Nausea or vomiting). 30 tablet 1  . SPIRIVA HANDIHALER 18 MCG inhalation capsule INHALE 1 CAPSULE VIA HANDIHALER ONCE DAILY AT THE SAME TIME EVERY DAY 30 capsule 0  . vitamin B-12 (CYANOCOBALAMIN) 1000 MCG tablet Take 1 tablet (1,000 mcg total) by mouth daily. 90 tablet 1   No current facility-administered medications for this visit.   Facility-Administered Medications Ordered in Other Visits  Medication Dose Route Frequency Provider Last Rate Last Admin  . albuterol (PROVENTIL) (2.5 MG/3ML) 0.083% nebulizer solution 2.5 mg  2.5 mg Nebulization Once System, Provider Not In      . heparin lock flush 100 unit/mL  500 Units Intravenous Once Earlie Server, MD      . sodium chloride flush (NS) 0.9 % injection 10 mL  10 mL Intravenous PRN Earlie Server, MD   10 mL at 04/04/19 0903     PHYSICAL EXAMINATION: ECOG PERFORMANCE STATUS: 0 - Asymptomatic Vitals:   05/06/20 0937  BP: (!) 153/75  Pulse: 94  Resp: 16  Temp: (!) 97 F (36.1 C)   Filed Weights   05/06/20 0937  Weight: 145 lb 8 oz (66 kg)    Physical Exam Constitutional:       General: He is not in acute distress. HENT:     Head: Normocephalic and atraumatic.  Eyes:     General: No scleral icterus. Cardiovascular:     Rate and Rhythm: Normal rate and regular rhythm.     Heart sounds: Normal heart sounds.  Pulmonary:     Effort: Pulmonary effort is normal.  No respiratory distress.     Breath sounds: No wheezing.  Abdominal:     General: Bowel sounds are normal. There is no distension.     Palpations: Abdomen is soft.  Musculoskeletal:        General: No deformity. Normal range of motion.     Cervical back: Normal range of motion and neck supple.  Skin:    General: Skin is warm and dry.     Findings: No erythema or rash.  Neurological:     Mental Status: He is alert and oriented to person, place, and time. Mental status is at baseline.     Cranial Nerves: No cranial nerve deficit.     Coordination: Coordination normal.  Psychiatric:        Mood and Affect: Mood normal.      LABORATORY DATA:  I have reviewed the data as listed Lab Results  Component Value Date   WBC 15.8 (H) 04/22/2020   HGB 15.5 04/22/2020   HCT 43.6 04/22/2020   MCV 96.9 04/22/2020   PLT 150 04/22/2020   Recent Labs    03/17/20 0830 03/17/20 0830 03/31/20 0825 04/07/20 0854 04/22/20 0825  NA 134*   < > 137 137 135  K 4.6   < > 3.9 3.7 4.3  CL 101   < > 102 104 102  CO2 20*   < > 23 21* 24  GLUCOSE 167*   < > 126* 163* 99  BUN 14   < > _0 CREATININE 1.19   < > 1.10 1.10 1.04  CALCIUM 9.0   < > 9.1 8.8* 9.0  GFRNONAA >60   < > >60 >60 >60  GFRAA >60  --  >60 >60  --   PROT 7.7   < > 7.6 7.1 7.6  ALBUMIN 3.5   < > 3.5 3.4* 3.7  AST 32   < > _1 ALT 25   < > _2 ALKPHOS 86   < > 87 76 84  BILITOT 0.8   < > 0.7 0.5 0.9   < > = values in this interval not displayed.   Iron/TIBC/Ferritin/ %Sat No results found for: IRON, TIBC, FERRITIN, IRONPCTSAT   RADIOGRAPHIC STUDIES: I have personally reviewed the radiological images as listed and agreed  with the findings in the report. CT Chest W Contrast  Result Date: 04/21/2020 CLINICAL DATA:  Lung cancer follow-up, squamous cell carcinoma diagnosed 2 years ago. EXAM: CT CHEST WITH CONTRAST TECHNIQUE: Multidetector CT imaging of the chest was performed during intravenous contrast administration. CONTRAST:  54m OMNIPAQUE IOHEXOL 300 MG/ML  SOLN COMPARISON:  February 06, 2020 FINDINGS: Cardiovascular: LEFT-sided Port-A-Cath terminates at the midportion to distal portion of superior vena cava. Heart size is normal. Trace pericardial fluid similar to the prior study. Central pulmonary vasculature is distorted at the RIGHT hilum as before otherwise unremarkable on venous phase assessment. Mediastinum/Nodes: Thoracic inlet structures are normal. No axillary lymphadenopathy. No mediastinal adenopathy. Esophagus grossly normal. Lungs/Pleura: Emphysema. Postoperative changes of RIGHT upper lobectomy. No dense consolidation. Linear pattern of septal thickening extends through the RIGHT mid chest compatible with prior radiotherapy. Scattered nodularity is unchanged. Micro nodules grouped in the anterior LEFT upper lobe with similar appearance. Enlarging peripheral LEFT upper lobe nodules however since the prior study (image 55 of series 3) with a 5 mm nodule that previously was quite indistinct and at best 2 mm. Similar appearance and change in a small 3  mm nodule in the anterior LEFT upper lobe laterally. Scattered millimetric nodules throughout the LEFT chest including a nother small nodule that is approximately 3-4 mm on image 125 of series 3 and at least 4 others that show a similar appearance at the periphery of the LEFT lower lobe on images 112 through 128. No effusion. New area of septal nodularity in the RIGHT lung base on image 110 of series 3 measures 4-5 mm Upper Abdomen: Imaged portions of liver, spleen, pancreas and gastrointestinal tract are unremarkable. The adrenal glands are normal. Nephrolithiasis as  before. Symmetric renal enhancement. Musculoskeletal: No acute bone finding. No destructive bone process. Spinal degenerative changes. IMPRESSION: Increasingly nodular appearance of post treatment changes in the RIGHT chest albeit subtle since the previous study with scattered pulmonary nodules throughout the chest many of these nodules removed from post treatment changes are new since February of 2021 and suspicious for metastatic disease. Aortic atherosclerosis. Aortic Atherosclerosis (ICD10-I70.0). Electronically Signed   By: Zetta Bills M.D.   On: 04/21/2020 17:03   CT CHEST W CONTRAST  Result Date: 02/06/2020 CLINICAL DATA:  Lung cancer treated with surgery, chemotherapy and radiation therapy. EXAM: CT CHEST WITH CONTRAST TECHNIQUE: Multidetector CT imaging of the chest was performed during intravenous contrast administration. CONTRAST:  7m OMNIPAQUE IOHEXOL 300 MG/ML  SOLN COMPARISON:  11/21/2019. FINDINGS: Cardiovascular: Left IJ Port-A-Cath terminates in the SVC. Atherosclerotic calcification of the aorta and coronary arteries. Pulmonic trunk is enlarged. Heart size normal. Small amount of pericardial fluid appears similar. Mediastinum/Nodes: Subcentimeter low-attenuation nodules in the thyroid. No follow-up recommended (ref: J Am Coll Radiol. 2015 Feb;12(2): 143-50).Low internal jugular lymph nodes are not enlarged by CT size criteria. No pathologically enlarged mediastinal, left hilar or axillary lymph nodes. Evaluation of the right hilum is somewhat limited due to soft tissue thickening and postoperative changes. Esophagus is grossly unremarkable. Lungs/Pleura: Right upper lobectomy with associated pleuroparenchymal scarring and post radiation architectural distortion in the medial right hemithorax. Peribronchovascular nodularity in the right lower lobe is new or increased from the prior exam. In addition to pre-existing stable pulmonary nodules, there are new bilateral pulmonary nodules  measuring up to 5 mm in the medial right lower lobe (3/73). Centrilobular emphysema. Tiny right pleural effusion with associated pleural thickening, stable. Airway is otherwise unremarkable. Upper Abdomen: Visualized portions of the liver, gallbladder and adrenal glands are unremarkable. Stones are seen in the kidneys bilaterally. Renal cortical scarring on the right. Subcentimeter low-attenuation lesion in the left kidney is partially imaged. Visualized portions of the kidneys, spleen, pancreas, stomach and bowel are otherwise unremarkable. No upper abdominal adenopathy. Musculoskeletal: Degenerative changes in the spine. No worrisome lytic or sclerotic lesions. IMPRESSION: 1. Postoperative and post radiation changes in the right lung. Scattered new bilateral pulmonary nodules are worrisome for metastatic disease. 2. Chronic small right fibrothorax. 3. Bilateral renal stones. 4. Aortic atherosclerosis (ICD10-I70.0). Coronary artery calcification. 5. Enlarged pulmonic trunk, indicative of pulmonary arterial hypertension. 6.  Emphysema (ICD10-J43.9). Electronically Signed   By: MLorin PicketM.D.   On: 02/06/2020 14:03   DG Fluoro Guide CV Line Left  Result Date: 03/18/2020 INDICATION: No blood return from Port-A-Cath. EXAM: PORT-A-CATH CHECK MEDICATIONS: None. ANESTHESIA/SEDATION: None. FLUOROSCOPY TIME:  Fluoroscopy Time: 1 minutes 6 seconds (22.5 mGy). COMPLICATIONS: None. PROCEDURE: Standard Port-A-Cath injection performed under fluoroscopic guidance. Nonionic contrast was used. Catheter is intact. Small fibrin sheath noted on the distal aspect of the Port-A-Cath. Flow into the venous system is present. There is noted the right hilum. Density  noted over the right hilum IMPRESSION: Small fibrin sheath noted over distal aspect of the Port-A-Cath. Intravenous flow is noted. Phone to the patient's nurse at the time of the study. Electronically Signed   By: Marcello Moores  Register   On: 03/18/2020 11:10      ASSESSMENT & PLAN:  1. Squamous cell carcinoma of lung, right ([place])   2. Encounter for antineoplastic immunotherapy   3. Hypokalemia   4. Hypomagnesemia    #Recurrent Squamous cell carcinoma of lung,  Currently on maintenance immunotherapy with durvalumab every 2 weeks Labs are reviewed and discussed with patient. Counts are acceptable for Durvalumab.   There is plan of total of 26 cycles of immunotherapy, however given his recent CT findings and concern of metastatic disease, I will continue immunotherapy until we clarify if these lung nodules are metastatic disease or infectious/inflamation.   #COPD exacerbation symptoms are improved with a course of prednisone, on Spiriva and Symbicort  Recommend him to re-establish care with his pulmonologist.  #Chronic hypomagnesia.  Continue slow magnesium twice daily.  #Chronic hypokalemia, continue oral potassium supplementation 20 mEq today.   Return of visit: 2 weeks.    Earlie Server, MD, PhD Hematology Oncology [facility] 05/06/2020

## 2020-05-15 ENCOUNTER — Other Ambulatory Visit: Payer: Self-pay | Admitting: Oncology

## 2020-05-17 ENCOUNTER — Other Ambulatory Visit: Payer: Self-pay | Admitting: Oncology

## 2020-05-20 ENCOUNTER — Inpatient Hospital Stay: Payer: Managed Care, Other (non HMO) | Attending: Oncology

## 2020-05-20 ENCOUNTER — Other Ambulatory Visit: Payer: Self-pay

## 2020-05-20 ENCOUNTER — Other Ambulatory Visit: Payer: Self-pay | Admitting: Oncology

## 2020-05-20 ENCOUNTER — Encounter: Payer: Self-pay | Admitting: Oncology

## 2020-05-20 ENCOUNTER — Inpatient Hospital Stay: Payer: Managed Care, Other (non HMO)

## 2020-05-20 ENCOUNTER — Inpatient Hospital Stay (HOSPITAL_BASED_OUTPATIENT_CLINIC_OR_DEPARTMENT_OTHER): Payer: Managed Care, Other (non HMO) | Admitting: Oncology

## 2020-05-20 VITALS — BP 124/91 | HR 68 | Resp 16

## 2020-05-20 VITALS — BP 132/85 | HR 100 | Temp 97.0°F | Resp 16 | Wt 150.9 lb

## 2020-05-20 DIAGNOSIS — Z803 Family history of malignant neoplasm of breast: Secondary | ICD-10-CM | POA: Insufficient documentation

## 2020-05-20 DIAGNOSIS — Z801 Family history of malignant neoplasm of trachea, bronchus and lung: Secondary | ICD-10-CM | POA: Insufficient documentation

## 2020-05-20 DIAGNOSIS — E876 Hypokalemia: Secondary | ICD-10-CM | POA: Diagnosis not present

## 2020-05-20 DIAGNOSIS — Z5112 Encounter for antineoplastic immunotherapy: Secondary | ICD-10-CM

## 2020-05-20 DIAGNOSIS — C3491 Malignant neoplasm of unspecified part of right bronchus or lung: Secondary | ICD-10-CM | POA: Diagnosis not present

## 2020-05-20 DIAGNOSIS — I10 Essential (primary) hypertension: Secondary | ICD-10-CM | POA: Diagnosis not present

## 2020-05-20 DIAGNOSIS — Z8249 Family history of ischemic heart disease and other diseases of the circulatory system: Secondary | ICD-10-CM | POA: Diagnosis not present

## 2020-05-20 DIAGNOSIS — Z79899 Other long term (current) drug therapy: Secondary | ICD-10-CM | POA: Diagnosis not present

## 2020-05-20 DIAGNOSIS — Z87891 Personal history of nicotine dependence: Secondary | ICD-10-CM | POA: Insufficient documentation

## 2020-05-20 DIAGNOSIS — R000 Tachycardia, unspecified: Secondary | ICD-10-CM | POA: Diagnosis not present

## 2020-05-20 DIAGNOSIS — Z833 Family history of diabetes mellitus: Secondary | ICD-10-CM | POA: Insufficient documentation

## 2020-05-20 DIAGNOSIS — J441 Chronic obstructive pulmonary disease with (acute) exacerbation: Secondary | ICD-10-CM | POA: Diagnosis not present

## 2020-05-20 DIAGNOSIS — Z7951 Long term (current) use of inhaled steroids: Secondary | ICD-10-CM | POA: Insufficient documentation

## 2020-05-20 DIAGNOSIS — R059 Cough, unspecified: Secondary | ICD-10-CM

## 2020-05-20 DIAGNOSIS — Z87442 Personal history of urinary calculi: Secondary | ICD-10-CM | POA: Diagnosis not present

## 2020-05-20 LAB — COMPREHENSIVE METABOLIC PANEL
ALT: 21 U/L (ref 0–44)
AST: 26 U/L (ref 15–41)
Albumin: 3.3 g/dL — ABNORMAL LOW (ref 3.5–5.0)
Alkaline Phosphatase: 98 U/L (ref 38–126)
Anion gap: 10 (ref 5–15)
BUN: 13 mg/dL (ref 6–20)
CO2: 26 mmol/L (ref 22–32)
Calcium: 8.7 mg/dL — ABNORMAL LOW (ref 8.9–10.3)
Chloride: 104 mmol/L (ref 98–111)
Creatinine, Ser: 1.05 mg/dL (ref 0.61–1.24)
GFR, Estimated: >60 mL/min (ref >60)
Glucose, Bld: 100 mg/dL — ABNORMAL HIGH (ref 70–99)
Potassium: 3.9 mmol/L (ref 3.5–5.1)
Sodium: 140 mmol/L (ref 135–145)
Total Bilirubin: 0.6 mg/dL (ref 0.3–1.2)
Total Protein: 7.1 g/dL (ref 6.5–8.1)

## 2020-05-20 LAB — CBC WITH DIFFERENTIAL/PLATELET
Abs Immature Granulocytes: 0.03 10*3/uL (ref 0.00–0.07)
Basophils Absolute: 0.1 10*3/uL (ref 0.0–0.1)
Basophils Relative: 1 %
Eosinophils Absolute: 0.2 10*3/uL (ref 0.0–0.5)
Eosinophils Relative: 3 %
HCT: 38.9 % — ABNORMAL LOW (ref 39.0–52.0)
Hemoglobin: 13.4 g/dL (ref 13.0–17.0)
Immature Granulocytes: 0 %
Lymphocytes Relative: 15 %
Lymphs Abs: 1.2 10*3/uL (ref 0.7–4.0)
MCH: 34.7 pg — ABNORMAL HIGH (ref 26.0–34.0)
MCHC: 34.4 g/dL (ref 30.0–36.0)
MCV: 100.8 fL — ABNORMAL HIGH (ref 80.0–100.0)
Monocytes Absolute: 0.7 10*3/uL (ref 0.1–1.0)
Monocytes Relative: 9 %
Neutro Abs: 5.6 10*3/uL (ref 1.7–7.7)
Neutrophils Relative %: 72 %
Platelets: 174 10*3/uL (ref 150–400)
RBC: 3.86 MIL/uL — ABNORMAL LOW (ref 4.22–5.81)
RDW: 14.5 % (ref 11.5–15.5)
WBC: 7.8 10*3/uL (ref 4.0–10.5)
nRBC: 0 % (ref 0.0–0.2)

## 2020-05-20 LAB — TSH: TSH: 0.615 u[IU]/mL (ref 0.350–4.500)

## 2020-05-20 MED ORDER — SODIUM CHLORIDE 0.9 % IV SOLN
Freq: Once | INTRAVENOUS | Status: AC
  Filled 2020-05-20: qty 250

## 2020-05-20 MED ORDER — HEPARIN SOD (PORK) LOCK FLUSH 100 UNIT/ML IV SOLN
INTRAVENOUS | Status: AC
  Filled 2020-05-20: qty 5

## 2020-05-20 MED ORDER — SODIUM CHLORIDE 0.9 % IV SOLN
10.0000 mg/kg | Freq: Once | INTRAVENOUS | Status: AC
  Administered 2020-05-20: 620 mg via INTRAVENOUS
  Filled 2020-05-20: qty 2.4

## 2020-05-20 MED ORDER — SODIUM CHLORIDE 0.9% FLUSH
10.0000 mL | INTRAVENOUS | Status: DC | PRN
  Administered 2020-05-20: 10 mL via INTRAVENOUS
  Filled 2020-05-20: qty 10

## 2020-05-20 MED ORDER — HEPARIN SOD (PORK) LOCK FLUSH 100 UNIT/ML IV SOLN
500.0000 [IU] | Freq: Once | INTRAVENOUS | Status: AC
  Administered 2020-05-20: 500 [IU] via INTRAVENOUS
  Filled 2020-05-20: qty 5

## 2020-05-20 NOTE — Progress Notes (Signed)
Hematology/Oncology Follow up note [facility] [phone] [phone]   Patient Care Team: Tracie Harrier, MD as PCP - General (Internal Medicine) Earlie Server, MD as Consulting Physician (Hematology and Oncology)  REASON FOR VISIT:  Follow up for treatment of squamous lung cancer.   HISTORY OF PRESENTING ILLNESS:  # Dec 2019 Stage I squamous lung cancer #s/p Bronchoscopy on 06/25/2018. subcarina EBUS FNA was non diagnostic, hypocellular specimen.  # 08/13/2018 CT guided right upper lobe biopsy pathology showed dense fibrosis and mixed inflammatory cells with prominent polytypic plasma cells component. Focal benign bronchial wall and alveolar spaces. No malignancy was identified.   # 08/13/2018 underwent right thoracotomy and wedge resection of a right upper lobe mass.  Frozen section was consistent with an inflammatory process.  On the second postop day, preliminary pathology reports high-grade malignancy.  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  08/20/2018 Patient was therefore taken back to the OR and underwent take complete lobectomy  pT1b pN0 cM0 stage I squamous cell lung cancer.  Margin is negative.  Recommend observation.    # 12/03/2018 CT chest w contrast showed local recurrence.  12/10/2018 PET showed No evidence of distant metastatic disease # 12/26/2018 s/p bronchoscopy biopsy. Confirmed local recurrence of squamous cell lung cancer.  medi port placed by Dr.Oaks.  Cancer treatment Started concurrent chemoradiation on 01/16/2019 Carboplatin AUC of 2 and Taxol 45 mg/m2 weekly finished in 03/05/2019 04/10/2019, patient started on durvalumab maintenance.  INTERVAL HISTORY Samuel Choi is a 54 y.o. male who has above history reviewed by me today presents for evaluation prior to immunotherapy for treatment of  recurrent  squamous cell lung cancer. Cough has improved.  In the clinic, initial heart rate was 147.  He denies any  palpitation.  He admits that he forgot his diltiazem this morning.  Denies any chest pain.  No new complaints  Review of Systems  Constitutional: Positive for fatigue. Negative for appetite change, chills, fever and unexpected weight change.  HENT:   Negative for hearing loss and voice change.   Eyes: Negative for eye problems and icterus.  Respiratory: Negative for chest tightness, cough and shortness of breath.   Cardiovascular: Negative for chest pain and leg swelling.  Gastrointestinal: Negative for abdominal distention and abdominal pain.  Endocrine: Negative for hot flashes.  Genitourinary: Negative for difficulty urinating, dysuria and frequency.   Musculoskeletal: Negative for arthralgias.  Skin: Negative for itching and rash.  Neurological: Negative for light-headedness and numbness.  Hematological: Negative for adenopathy. Does not bruise/bleed easily.  Psychiatric/Behavioral: Negative for confusion.    MEDICAL HISTORY:  Past Medical History:  Diagnosis Date  . Alcohol abuse    usually drinks 2-3 drinks per day  . Atherosclerosis 06/2018  . Chronic sinusitis   . Dehydration 02/07/2019  . Emphysema of lung ([place]) 06/2018   patient unaware of this.  . Hip fracture ([place]) 05/2018   no surgery  . History of kidney stones 05/2018   per xray, bilateral nephrolitiasis  . Hypertension   . Squamous cell carcinoma of lung, right ([place]) 06/2018    SURGICAL HISTORY: Past Surgical History:  Procedure Laterality Date  . BRAIN SURGERY  10/2017   nasal/sinus endoscopy. mass benign  . ELECTROMAGNETIC NAVIGATION BROCHOSCOPY Right 06/25/2018   Procedure: ELECTROMAGNETIC NAVIGATION BRONCHOSCOPY;  Surgeon: [name], MD;  Location: [facility] ORS;  Service: Cardiopulmonary;  Laterality: Right;  . ENDOBRONCHIAL ULTRASOUND Right 06/25/2018   Procedure: ENDOBRONCHIAL ULTRASOUND;  Surgeon: [name], MD;  Location:  [facility] ORS;  Service: Cardiopulmonary;  Laterality: Right;  . ENDOBRONCHIAL  ULTRASOUND Right 12/26/2018   Procedure: ENDOBRONCHIAL ULTRASOUND RIGHT;  Surgeon: [name], MD;  Location: [facility] ORS;  Service: Cardiopulmonary;  Laterality: Right;  . FLEXIBLE BRONCHOSCOPY N/A 08/20/2018   Procedure: FLEXIBLE BRONCHOSCOPY PREOP;  Surgeon: [name], MD;  Location: [facility] ORS;  Service: Thoracic;  Laterality: N/A;  . IR CV LINE INJECTION  04/04/2019  . NASAL SINUS SURGERY  10/2017   At [facility], frontal sinusotomy, ethmoidectomy, resection anterior cranial fossa neoplasm, turbinate resection  . PORTACATH PLACEMENT Left 01/15/2019   Procedure: INSERTION PORT-A-CATH;  Surgeon: [name], MD;  Location: [facility] ORS;  Service: General;  Laterality: Left;  . THORACOTOMY Right 08/13/2018   Procedure: PREOP BROCHOSCOPY WITH RIGHT THORACOTOMY AND RUL RESECTION;  Surgeon: [name], MD;  Location: [facility] ORS;  Service: General;  Laterality: Right;  . THORACOTOMY Right 08/20/2018   Procedure: THORACOTOMY MAJOR RIGHT UPPER LOBE LOBECTOMY;  Surgeon: [name], MD;  Location: [facility] ORS;  Service: Thoracic;  Laterality: Right;  . TOE SURGERY Left    pin in left toe    SOCIAL HISTORY: Social History   Socioeconomic History  . Marital status: Married    Spouse name: lisa  . Number of children: Not on file  . Years of education: Not on file  . Highest education level: Not on file  Occupational History  . Occupation: welding    Comment: taking time off to resolve issues  Tobacco Use  . Smoking status: Former Smoker    Packs/day: 0.50    Years: 15.00    Pack years: 7.50    Types: Cigarettes    Quit date: 08/2018    Years since quitting: 1.7  . Smokeless tobacco: Never Used  Vaping Use  . Vaping Use: Never used  Substance and Sexual Activity  . Alcohol use: Yes    Alcohol/week: 3.0 standard drinks    Types: 3 Cans of beer per week    Comment: usually 2 drinks per week, per patient  . Drug use: No  . Sexual activity: Not on file  Other Topics Concern  . Not on file  Social  History Narrative  . Not on file   Social Determinants of Health   Financial Resource Strain:   . Difficulty of Paying Living Expenses: Not on file  Food Insecurity:   . Worried About Charity fundraiser in the Last Year: Not on file  . Ran Out of Food in the Last Year: Not on file  Transportation Needs:   . Lack of Transportation (Medical): Not on file  . Lack of Transportation (Non-Medical): Not on file  Physical Activity:   . Days of Exercise per Week: Not on file  . Minutes of Exercise per Session: Not on file  Stress:   . Feeling of Stress : Not on file  Social Connections:   . Frequency of Communication with Friends and Family: Not on file  . Frequency of Social Gatherings with Friends and Family: Not on file  . Attends Religious Services: Not on file  . Active Member of Clubs or Organizations: Not on file  . Attends Archivist Meetings: Not on file  . Marital Status: Not on file  Intimate Partner Violence:   . Fear of Current or Ex-Partner: Not on file  . Emotionally Abused: Not on file  . Physically Abused: Not on file  . Sexually Abused: Not on file    FAMILY HISTORY: Family History  Problem Relation Age of Onset  . Breast cancer Mother   . Diabetes Mother   . Lung cancer Father   . Hypertension Father     ALLERGIES:  has No Known Allergies.  MEDICATIONS:  Current Outpatient Medications  Medication Sig Dispense Refill  . albuterol (VENTOLIN HFA) 108 (90 Base) MCG/ACT inhaler TAKE 2 PUFFS BY MOUTH EVERY 6 HOURS AS NEEDED FOR WHEEZE OR SHORTNESS OF BREATH 6.7 each 6  . cloNIDine (CATAPRES) 0.1 MG tablet Take 0.1 mg by mouth daily.   5  . diltiazem (CARDIZEM CD) 120 MG 24 hr capsule Take 120 mg by mouth daily.    . folic acid (V-R FOLIC ACID) 811 MCG tablet Take 1 tablet (400 mcg total) by mouth daily. 90 tablet 1  . hydrALAZINE (APRESOLINE) 100 MG tablet Take 50 mg by mouth 2 (two) times daily.     . hydrochlorothiazide (HYDRODIURIL) 12.5 MG tablet  Take 12.5 mg by mouth daily.     Marland Kitchen KLOR-CON M20 20 MEQ tablet TAKE 1 TABLET BY MOUTH TWICE A DAY 60 tablet 0  . latanoprost (XALATAN) 0.005 % ophthalmic solution 1 drop at bedtime.    . lidocaine-prilocaine (EMLA) cream Apply to affected area once 30 g 3  . magnesium chloride (SLOW-MAG) 64 MG TBEC SR tablet Take 1 tablet (64 mg total) by mouth 2 (two) times daily. TAKE 1 TABLET (64 MG TOTAL) BY MOUTH DAILY. 60 tablet 1  . methocarbamol (ROBAXIN) 500 MG tablet Take 1 tablet (500 mg total) by mouth at bedtime. 30 tablet 0  . olmesartan (BENICAR) 40 MG tablet Take 40 mg by mouth every other day.     . ondansetron (ZOFRAN) 8 MG tablet Take 1 tablet (8 mg total) by mouth 2 (two) times daily as needed for refractory nausea / vomiting. Start on day 3 after chemo. 30 tablet 1  . prochlorperazine (COMPAZINE) 10 MG tablet Take 1 tablet (10 mg total) by mouth every 6 (six) hours as needed (Nausea or vomiting). 30 tablet 1  . SPIRIVA HANDIHALER 18 MCG inhalation capsule INHALE 1 CAPSULE VIA HANDIHALER ONCE DAILY AT THE SAME TIME EVERY DAY 30 capsule 0  . SYMBICORT 80-4.5 MCG/ACT inhaler INHALE 2 PUFFS INTO THE LUNGS IN THE MORNING AND AT BEDTIME. 10.2 each 6  . vitamin B-12 (CYANOCOBALAMIN) 1000 MCG tablet Take 1 tablet (1,000 mcg total) by mouth daily. 90 tablet 1   No current facility-administered medications for this visit.   Facility-Administered Medications Ordered in Other Visits  Medication Dose Route Frequency Provider Last Rate Last Admin  . albuterol (PROVENTIL) (2.5 MG/3ML) 0.083% nebulizer solution 2.5 mg  2.5 mg Nebulization Once System, Provider Not In      . heparin lock flush 100 unit/mL  500 Units Intravenous Once Earlie Server, MD      . heparin lock flush 100 unit/mL  500 Units Intravenous Once Earlie Server, MD      . sodium chloride flush (NS) 0.9 % injection 10 mL  10 mL Intravenous PRN Earlie Server, MD   10 mL at 04/04/19 0903  . sodium chloride flush (NS) 0.9 % injection 10 mL  10 mL Intravenous  PRN Earlie Server, MD   10 mL at 05/20/20 0818     PHYSICAL EXAMINATION: ECOG PERFORMANCE STATUS: 0 - Asymptomatic There were no vitals filed for this visit. There were no vitals filed for this visit.  Physical Exam Constitutional:      General: He is not in acute distress. HENT:  Head: Normocephalic and atraumatic.  Eyes:     General: No scleral icterus. Cardiovascular:     Rate and Rhythm: Regular rhythm. Tachycardia present.     Heart sounds: Normal heart sounds.  Pulmonary:     Effort: Pulmonary effort is normal. No respiratory distress.     Breath sounds: No wheezing.  Abdominal:     General: Bowel sounds are normal. There is no distension.     Palpations: Abdomen is soft.  Musculoskeletal:        General: No deformity. Normal range of motion.     Cervical back: Normal range of motion and neck supple.  Skin:    General: Skin is warm and dry.     Findings: No erythema or rash.  Neurological:     Mental Status: He is alert and oriented to person, place, and time. Mental status is at baseline.     Cranial Nerves: No cranial nerve deficit.     Coordination: Coordination normal.  Psychiatric:        Mood and Affect: Mood normal.      LABORATORY DATA:  I have reviewed the data as listed Lab Results  Component Value Date   WBC 7.2 05/06/2020   HGB 15.6 05/06/2020   HCT 44.3 05/06/2020   MCV 98.9 05/06/2020   PLT 169 05/06/2020   Recent Labs    03/17/20 0830 03/17/20 0830 03/31/20 0825 03/31/20 0825 04/07/20 0854 04/22/20 0825 05/06/20 0806  NA 134*   < > 137   < > 137 135 140  K 4.6   < > 3.9   < > 3.7 4.3 3.5  CL 101   < > 102   < > 104 102 105  CO2 20*   < > 23   < > 21* 24 22  GLUCOSE 167*   < > 126*   < > 163* 99 98  BUN 14   < > 13   < > _0 CREATININE 1.19   < > 1.10   < > 1.10 1.04 0.89  CALCIUM 9.0   < > 9.1   < > 8.8* 9.0 8.8*  GFRNONAA >60   < > >60   < > >60 >60 >60  GFRAA >60  --  >60  --  >60  --   --   PROT 7.7   < > 7.6   < > 7.1  7.6 7.5  ALBUMIN 3.5   < > 3.5   < > 3.4* 3.7 3.5  AST 32   < > 29   < > 25 22 38  ALT 25   < > 22   < > 19 21 34  ALKPHOS 86   < > 87   < > 76 84 110  BILITOT 0.8   < > 0.7   < > 0.5 0.9 0.6   < > = values in this interval not displayed.   Iron/TIBC/Ferritin/ %Sat No results found for: IRON, TIBC, FERRITIN, IRONPCTSAT   RADIOGRAPHIC STUDIES: I have personally reviewed the radiological images as listed and agreed with the findings in the report. CT Chest W Contrast  Result Date: 04/21/2020 CLINICAL DATA:  Lung cancer follow-up, squamous cell carcinoma diagnosed 2 years ago. EXAM: CT CHEST WITH CONTRAST TECHNIQUE: Multidetector CT imaging of the chest was performed during intravenous contrast administration. CONTRAST:  6m OMNIPAQUE IOHEXOL 300 MG/ML  SOLN COMPARISON:  February 06, 2020 FINDINGS: Cardiovascular: LEFT-sided Port-A-Cath terminates at the midportion to distal  portion of superior vena cava. Heart size is normal. Trace pericardial fluid similar to the prior study. Central pulmonary vasculature is distorted at the RIGHT hilum as before otherwise unremarkable on venous phase assessment. Mediastinum/Nodes: Thoracic inlet structures are normal. No axillary lymphadenopathy. No mediastinal adenopathy. Esophagus grossly normal. Lungs/Pleura: Emphysema. Postoperative changes of RIGHT upper lobectomy. No dense consolidation. Linear pattern of septal thickening extends through the RIGHT mid chest compatible with prior radiotherapy. Scattered nodularity is unchanged. Micro nodules grouped in the anterior LEFT upper lobe with similar appearance. Enlarging peripheral LEFT upper lobe nodules however since the prior study (image 55 of series 3) with a 5 mm nodule that previously was quite indistinct and at best 2 mm. Similar appearance and change in a small 3 mm nodule in the anterior LEFT upper lobe laterally. Scattered millimetric nodules throughout the LEFT chest including a nother small nodule that is  approximately 3-4 mm on image 125 of series 3 and at least 4 others that show a similar appearance at the periphery of the LEFT lower lobe on images 112 through 128. No effusion. New area of septal nodularity in the RIGHT lung base on image 110 of series 3 measures 4-5 mm Upper Abdomen: Imaged portions of liver, spleen, pancreas and gastrointestinal tract are unremarkable. The adrenal glands are normal. Nephrolithiasis as before. Symmetric renal enhancement. Musculoskeletal: No acute bone finding. No destructive bone process. Spinal degenerative changes. IMPRESSION: Increasingly nodular appearance of post treatment changes in the RIGHT chest albeit subtle since the previous study with scattered pulmonary nodules throughout the chest many of these nodules removed from post treatment changes are new since February of 2021 and suspicious for metastatic disease. Aortic atherosclerosis. Aortic Atherosclerosis (ICD10-I70.0). Electronically Signed   By: Zetta Bills M.D.   On: 04/21/2020 17:03   DG Fluoro Guide CV Line Left  Result Date: 03/18/2020 INDICATION: No blood return from Port-A-Cath. EXAM: PORT-A-CATH CHECK MEDICATIONS: None. ANESTHESIA/SEDATION: None. FLUOROSCOPY TIME:  Fluoroscopy Time: 1 minutes 6 seconds (22.5 mGy). COMPLICATIONS: None. PROCEDURE: Standard Port-A-Cath injection performed under fluoroscopic guidance. Nonionic contrast was used. Catheter is intact. Small fibrin sheath noted on the distal aspect of the Port-A-Cath. Flow into the venous system is present. There is noted the right hilum. Density noted over the right hilum IMPRESSION: Small fibrin sheath noted over distal aspect of the Port-A-Cath. Intravenous flow is noted. Phone to the patient's nurse at the time of the study. Electronically Signed   By: Marcello Moores  Register   On: 03/18/2020 11:10     ASSESSMENT & PLAN:  1. Squamous cell carcinoma of lung, right ([place])   2. Encounter for antineoplastic immunotherapy   3. Hypokalemia   4.  Hypomagnesemia   5. Tachycardia    #Recurrent Squamous cell carcinoma of lung,  Currently on maintenance immunotherapy with durvalumab every 2 weeks Labs are reviewed and discussed with patient Counts acceptable to proceed with durvalumab treatment.  This is his 26 cycle. I will obtain a CT scan in December for short-term follow-up of the new findings on previous CAT scan.   #COPD exacerbation symptoms are improved with a course of prednisone, on Spiriva and Symbicort Recommend patient to follow-up with pulmonology. #Chronic hypomagnesia.  Continue slow magnesium twice daily. #Chronic hypokalemia, continue oral potassium supplementation 20 mEq today.   #Tachycardia, EKG was obtained in the clinic.  EKG was reviewed by me.  Patient has a heart rate of 100, no acute changes recommend patient to follow-up with cardiology.  Tachycardia likely secondary to missing  medication. Return of visit: 2 weeks.    Earlie Server, MD, PhD Hematology Oncology [facility] 05/20/2020

## 2020-05-20 NOTE — Progress Notes (Signed)
Per Benjamine Mola, RN per Dr. Tasia Catchings, okay to proceed with treatment with HR 100.   Patient tolerated infusion well. Patient and VSS. Discharged home.

## 2020-05-20 NOTE — Progress Notes (Signed)
Patient denies new problems/concerns today.   °

## 2020-06-10 ENCOUNTER — Telehealth: Payer: Self-pay

## 2020-06-10 NOTE — Telephone Encounter (Signed)
Dr. Tasia Catchings has done P2P and CT was approved. Auth number [id number] 11/29-2/27.

## 2020-06-14 ENCOUNTER — Other Ambulatory Visit: Payer: Self-pay

## 2020-06-14 ENCOUNTER — Ambulatory Visit
Admission: RE | Admit: 2020-06-14 | Discharge: 2020-06-14 | Disposition: A | Discharge status: Home / Self Care | Payer: Managed Care, Other (non HMO) | Source: Ambulatory Visit | Attending: Oncology | Admitting: Oncology

## 2020-06-14 DIAGNOSIS — C3491 Malignant neoplasm of unspecified part of right bronchus or lung: Secondary | ICD-10-CM | POA: Diagnosis not present

## 2020-06-14 MED ORDER — IOHEXOL 300 MG/ML  SOLN
75.0000 mL | Freq: Once | INTRAMUSCULAR | Status: AC | PRN
  Administered 2020-06-14: 75 mL via INTRAVENOUS

## 2020-06-17 ENCOUNTER — Telehealth: Payer: Self-pay

## 2020-06-17 ENCOUNTER — Encounter: Payer: Self-pay | Admitting: Oncology

## 2020-06-17 ENCOUNTER — Inpatient Hospital Stay: Payer: Managed Care, Other (non HMO) | Attending: Oncology | Admitting: Oncology

## 2020-06-17 ENCOUNTER — Other Ambulatory Visit: Payer: Self-pay

## 2020-06-17 VITALS — BP 188/113 | HR 96 | Temp 97.7°F | Resp 16 | Wt 147.4 lb

## 2020-06-17 DIAGNOSIS — I2510 Atherosclerotic heart disease of native coronary artery without angina pectoris: Secondary | ICD-10-CM | POA: Diagnosis not present

## 2020-06-17 DIAGNOSIS — Z9221 Personal history of antineoplastic chemotherapy: Secondary | ICD-10-CM | POA: Diagnosis not present

## 2020-06-17 DIAGNOSIS — C3411 Malignant neoplasm of upper lobe, right bronchus or lung: Secondary | ICD-10-CM | POA: Diagnosis not present

## 2020-06-17 DIAGNOSIS — Z833 Family history of diabetes mellitus: Secondary | ICD-10-CM | POA: Insufficient documentation

## 2020-06-17 DIAGNOSIS — Z8249 Family history of ischemic heart disease and other diseases of the circulatory system: Secondary | ICD-10-CM | POA: Diagnosis not present

## 2020-06-17 DIAGNOSIS — Z87891 Personal history of nicotine dependence: Secondary | ICD-10-CM | POA: Diagnosis not present

## 2020-06-17 DIAGNOSIS — Z803 Family history of malignant neoplasm of breast: Secondary | ICD-10-CM | POA: Diagnosis not present

## 2020-06-17 DIAGNOSIS — Z923 Personal history of irradiation: Secondary | ICD-10-CM | POA: Diagnosis not present

## 2020-06-17 DIAGNOSIS — Z7189 Other specified counseling: Secondary | ICD-10-CM | POA: Diagnosis not present

## 2020-06-17 DIAGNOSIS — I10 Essential (primary) hypertension: Secondary | ICD-10-CM | POA: Insufficient documentation

## 2020-06-17 DIAGNOSIS — J439 Emphysema, unspecified: Secondary | ICD-10-CM | POA: Insufficient documentation

## 2020-06-17 DIAGNOSIS — E876 Hypokalemia: Secondary | ICD-10-CM | POA: Diagnosis not present

## 2020-06-17 DIAGNOSIS — Z7951 Long term (current) use of inhaled steroids: Secondary | ICD-10-CM | POA: Insufficient documentation

## 2020-06-17 DIAGNOSIS — C3491 Malignant neoplasm of unspecified part of right bronchus or lung: Secondary | ICD-10-CM

## 2020-06-17 DIAGNOSIS — Z902 Acquired absence of lung [part of]: Secondary | ICD-10-CM | POA: Diagnosis not present

## 2020-06-17 DIAGNOSIS — Z79899 Other long term (current) drug therapy: Secondary | ICD-10-CM | POA: Insufficient documentation

## 2020-06-17 DIAGNOSIS — Z87442 Personal history of urinary calculi: Secondary | ICD-10-CM | POA: Diagnosis not present

## 2020-06-17 DIAGNOSIS — Z801 Family history of malignant neoplasm of trachea, bronchus and lung: Secondary | ICD-10-CM | POA: Diagnosis not present

## 2020-06-17 NOTE — Progress Notes (Signed)
Patient denies new problems/concerns today.   °

## 2020-06-17 NOTE — Progress Notes (Addendum)
Hematology/Oncology Follow up note [facility] [phone] Fax:([phone]) [phone]   Patient Care Team: Tracie Harrier, MD as PCP - General (Internal Medicine) Earlie Server, MD as Consulting Physician (Hematology and Oncology)  REASON FOR VISIT:  Follow up for treatment of squamous lung cancer.   HISTORY OF PRESENTING ILLNESS:  # Dec 2019 Stage I squamous lung cancer #s/p Bronchoscopy on 06/25/2018. subcarina EBUS FNA was non diagnostic, hypocellular specimen.  # 08/13/2018 CT guided right upper lobe biopsy pathology showed dense fibrosis and mixed inflammatory cells with prominent polytypic plasma cells component. Focal benign bronchial wall and alveolar spaces. No malignancy was identified.   # 08/13/2018 underwent right thoracotomy and wedge resection of a right upper lobe mass.  Frozen section was consistent with an inflammatory process.  On the second postop day, preliminary pathology reports high-grade malignancy.  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  08/20/2018 Patient was therefore taken back to the OR and underwent take complete lobectomy  pT1b pN0 cM0 stage I squamous cell lung cancer.  Margin is negative.  Recommend observation.    # 12/03/2018 CT chest w contrast showed local recurrence.  12/10/2018 PET showed No evidence of distant metastatic disease # 12/26/2018 s/p bronchoscopy biopsy. Confirmed local recurrence of squamous cell lung cancer.  medi port placed by Dr.Oaks.  Cancer treatment Started concurrent chemoradiation on 01/16/2019 Carboplatin AUC of 2 and Taxol 45 mg/m2 weekly finished in 03/05/2019 04/10/2019, patient started on durvalumab maintenance.  INTERVAL HISTORY Samuel Choi is a 54 y.o. male who has above history reviewed by me today presents for evaluation prior to immunotherapy for treatment of  recurrent  squamous cell lung cancer. Cough has resolved. SOB symptoms are controlled with inhalers.    Review of Systems   Constitutional: Positive for fatigue. Negative for appetite change, chills, fever and unexpected weight change.  HENT:   Negative for hearing loss and voice change.   Eyes: Negative for eye problems and icterus.  Respiratory: Negative for chest tightness, cough and shortness of breath.   Cardiovascular: Negative for chest pain and leg swelling.  Gastrointestinal: Negative for abdominal distention and abdominal pain.  Endocrine: Negative for hot flashes.  Genitourinary: Negative for difficulty urinating, dysuria and frequency.   Musculoskeletal: Negative for arthralgias.  Skin: Negative for itching and rash.  Neurological: Negative for light-headedness and numbness.  Hematological: Negative for adenopathy. Does not bruise/bleed easily.  Psychiatric/Behavioral: Negative for confusion.    MEDICAL HISTORY:  Past Medical History:  Diagnosis Date  . Alcohol abuse    usually drinks 2-3 drinks per day  . Atherosclerosis 06/2018  . Chronic sinusitis   . Dehydration 02/07/2019  . Emphysema of lung ([place]) 06/2018   patient unaware of this.  . Hip fracture ([place]) 05/2018   no surgery  . History of kidney stones 05/2018   per xray, bilateral nephrolitiasis  . Hypertension   . Squamous cell carcinoma of lung, right ([place]) 06/2018    SURGICAL HISTORY: Past Surgical History:  Procedure Laterality Date  . BRAIN SURGERY  10/2017   nasal/sinus endoscopy. mass benign  . ELECTROMAGNETIC NAVIGATION BROCHOSCOPY Right 06/25/2018   Procedure: ELECTROMAGNETIC NAVIGATION BRONCHOSCOPY;  Surgeon: [name], MD;  Location: [facility];  Service: Cardiopulmonary;  Laterality: Right;  . ENDOBRONCHIAL ULTRASOUND Right 06/25/2018   Procedure: ENDOBRONCHIAL ULTRASOUND;  Surgeon: [name], MD;  Location: [facility];  Service: Cardiopulmonary;  Laterality: Right;  . ENDOBRONCHIAL ULTRASOUND Right 12/26/2018   Procedure: ENDOBRONCHIAL ULTRASOUND RIGHT;  Surgeon: [name], MD;  Location: [facility];  Service:  Cardiopulmonary;  Laterality: Right;  . FLEXIBLE BRONCHOSCOPY N/A 08/20/2018   Procedure: FLEXIBLE BRONCHOSCOPY PREOP;  Surgeon: [name], MD;  Location: [facility] OR[facility];  Service: Thoracic;  Laterality: N/A;  . IR CV LINE INJECTION  04/04/2019  . NASAL SINUS SURGERY  10/2017   At [facility], frontal sinusotomy, ethmoidectomy, resection anterior cranial fossa neoplasm, turbinate resection  . PORTACATH PLACEMENT Left 01/15/2019   Procedure: INSERTION PORT-A-CATH;  Surgeon: [name], MD;  Location: [facility] OR[facility];  Service: General;  Laterality: Left;  . THORACOTOMY Right 08/13/2018   Procedure: PREOP BROCHOSCOPY WITH RIGHT THORACOTOMY AND RUL RESECTION;  Surgeon: [name], MD;  Location: [facility];  Service: General;  Laterality: Right;  . THORACOTOMY Right 08/20/2018   Procedure: THORACOTOMY MAJOR RIGHT UPPER LOBE LOBECTOMY;  Surgeon: [name], MD;  Location: [facility];  Service: Thoracic;  Laterality: Right;  . TOE SURGERY Left    pin in left toe    SOCIAL HISTORY: Social History   Socioeconomic History  . Marital status: Married    Spouse name: lisa  . Number of children: Not on file  . Years of education: Not on file  . Highest education level: Not on file  Occupational History  . Occupation: welding    Comment: taking time off to resolve issues  Tobacco Use  . Smoking status: Former Smoker    Packs/day: 0.50    Years: 15.00    Pack years: 7.50    Types: Cigarettes    Quit date: 08/2018    Years since quitting: 1.8  . Smokeless tobacco: Never Used  Vaping Use  . Vaping Use: Never used  Substance and Sexual Activity  . Alcohol use: Yes    Alcohol/week: 3.0 standard drinks    Types: 3 Cans of beer per week    Comment: usually 2 drinks per week, per patient  . Drug use: No  . Sexual activity: Not on file  Other Topics Concern  . Not on file  Social History Narrative  . Not on file   Social Determinants of Health   Financial Resource Strain: Not on file  Food Insecurity:  Not on file  Transportation Needs: Not on file  Physical Activity: Not on file  Stress: Not on file  Social Connections: Not on file  Intimate Partner Violence: Not on file    FAMILY HISTORY: Family History  Problem Relation Age of Onset  . Breast cancer Mother   . Diabetes Mother   . Lung cancer Father   . Hypertension Father     ALLERGIES:  has No Known Allergies.  MEDICATIONS:  Current Outpatient Medications  Medication Sig Dispense Refill  . albuterol (VENTOLIN HFA) 108 (90 Base) MCG/ACT inhaler TAKE 2 PUFFS BY MOUTH EVERY 6 HOURS AS NEEDED FOR WHEEZE OR SHORTNESS OF BREATH 6.7 each 6  . cloNIDine (CATAPRES) 0.1 MG tablet Take 0.1 mg by mouth daily.   5  . diltiazem (CARDIZEM CD) 120 MG 24 hr capsule Take 120 mg by mouth daily.    . folic acid (V-R FOLIC ACID) 323 MCG tablet Take 1 tablet (400 mcg total) by mouth daily. 90 tablet 1  . hydrALAZINE (APRESOLINE) 100 MG tablet Take 50 mg by mouth 2 (two) times daily.     . hydrochlorothiazide (HYDRODIURIL) 12.5 MG tablet Take 12.5 mg by mouth daily.     Marland Kitchen KLOR-CON M20 20 MEQ tablet TAKE 1 TABLET BY MOUTH TWICE A DAY 60 tablet 0  . latanoprost (  XALATAN) 0.005 % ophthalmic solution 1 drop at bedtime.    . lidocaine-prilocaine (EMLA) cream Apply to affected area once 30 g 3  . magnesium chloride (SLOW-MAG) 64 MG TBEC SR tablet Take 1 tablet (64 mg total) by mouth 2 (two) times daily. TAKE 1 TABLET (64 MG TOTAL) BY MOUTH DAILY. 60 tablet 1  . methocarbamol (ROBAXIN) 500 MG tablet Take 1 tablet (500 mg total) by mouth at bedtime. 30 tablet 0  . olmesartan (BENICAR) 40 MG tablet Take 40 mg by mouth every other day.     . ondansetron (ZOFRAN) 8 MG tablet Take 1 tablet (8 mg total) by mouth 2 (two) times daily as needed for refractory nausea / vomiting. Start on day 3 after chemo. 30 tablet 1  . prochlorperazine (COMPAZINE) 10 MG tablet Take 1 tablet (10 mg total) by mouth every 6 (six) hours as needed (Nausea or vomiting). 30 tablet 1  .  SPIRIVA HANDIHALER 18 MCG inhalation capsule INHALE 1 CAPSULE VIA HANDIHALER ONCE DAILY AT THE SAME TIME EVERY DAY 30 capsule 0  . SYMBICORT 80-4.5 MCG/ACT inhaler INHALE 2 PUFFS INTO THE LUNGS IN THE MORNING AND AT BEDTIME. 10.2 each 6  . vitamin B-12 (CYANOCOBALAMIN) 1000 MCG tablet Take 1 tablet (1,000 mcg total) by mouth daily. 90 tablet 1   No current facility-administered medications for this visit.   Facility-Administered Medications Ordered in Other Visits  Medication Dose Route Frequency Provider Last Rate Last Admin  . albuterol (PROVENTIL) (2.5 MG/3ML) 0.083% nebulizer solution 2.5 mg  2.5 mg Nebulization Once System, Provider Not In      . heparin lock flush 100 unit/mL  500 Units Intravenous Once Earlie Server, MD      . sodium chloride flush (NS) 0.9 % injection 10 mL  10 mL Intravenous PRN Earlie Server, MD   10 mL at 04/04/19 0903     PHYSICAL EXAMINATION: ECOG PERFORMANCE STATUS: 0 - Asymptomatic Vitals:   06/17/20 1051  BP: (!) 188/113  Pulse: 96  Resp: 16  Temp: 97.7 F (36.5 C)   Filed Weights   06/17/20 1051  Weight: 147 lb 6.4 oz (66.9 kg)    Physical Exam Constitutional:      General: He is not in acute distress. HENT:     Head: Normocephalic and atraumatic.  Eyes:     General: No scleral icterus. Cardiovascular:     Rate and Rhythm: Normal rate and regular rhythm.     Heart sounds: Normal heart sounds.  Pulmonary:     Effort: Pulmonary effort is normal. No respiratory distress.     Breath sounds: No wheezing.  Abdominal:     General: Bowel sounds are normal. There is no distension.     Palpations: Abdomen is soft.  Musculoskeletal:        General: No deformity. Normal range of motion.     Cervical back: Normal range of motion and neck supple.  Skin:    General: Skin is warm and dry.     Findings: No erythema or rash.  Neurological:     Mental Status: He is alert and oriented to person, place, and time. Mental status is at baseline.     Cranial  Nerves: No cranial nerve deficit.     Coordination: Coordination normal.  Psychiatric:        Mood and Affect: Mood normal.      LABORATORY DATA:  I have reviewed the data as listed Lab Results  Component Value Date  WBC 7.8 05/20/2020   HGB 13.4 05/20/2020   HCT 38.9 (L) 05/20/2020   MCV 100.8 (H) 05/20/2020   PLT 174 05/20/2020   Recent Labs    03/17/20 0830 03/31/20 0825 04/07/20 0854 04/22/20 0825 05/06/20 0806 05/20/20 0818  NA 134* 137 137 135 140 140  K 4.6 3.9 3.7 4.3 3.5 3.9  CL 101 102 104 102 105 104  CO2 20* 23 21* _0 GLUCOSE 167* 126* 163* 99 98 100*  BUN _1 CREATININE 1.19 1.10 1.10 1.04 0.89 1.05  CALCIUM 9.0 9.1 8.8* 9.0 8.8* 8.7*  GFRNONAA >60 >60 >60 >60 >60 >60  GFRAA >60 >60 >60  --   --   --   PROT 7.7 7.6 7.1 7.6 7.5 7.1  ALBUMIN 3.5 3.5 3.4* 3.7 3.5 3.3*  AST 32 _2 38 26  ALT _3 34 21  ALKPHOS 86 87 76 84 110 98  BILITOT 0.8 0.7 0.5 0.9 0.6 0.6   Iron/TIBC/Ferritin/ %Sat No results found for: IRON, TIBC, FERRITIN, IRONPCTSAT   RADIOGRAPHIC STUDIES: I have personally reviewed the radiological images as listed and agreed with the findings in the report. CT Chest W Contrast  Result Date: 06/14/2020 CLINICAL DATA:  54 year old male with history of lung cancer. Follow-up study. EXAM: CT CHEST WITH CONTRAST TECHNIQUE: Multidetector CT imaging of the chest was performed during intravenous contrast administration. CONTRAST:  93m OMNIPAQUE IOHEXOL 300 MG/ML  SOLN COMPARISON:  Chest CT 04/21/2020. FINDINGS: Cardiovascular: Heart size is normal. Small amount of pericardial fluid and/or thickening, increased compared to the prior study, but unlikely to be of hemodynamic significance at this time. No associated pericardial calcification. There is aortic atherosclerosis, as well as atherosclerosis of the great vessels of the mediastinum and the coronary arteries, including calcified atherosclerotic plaque in the left  anterior descending coronary artery. Left-sided subclavian single-lumen porta cath with tip terminating in the distal superior vena cava. Mediastinum/Nodes: No pathologically enlarged mediastinal or hilar lymph nodes. Esophagus is unremarkable in appearance. No axillary lymphadenopathy. Lungs/Pleura: Status post right upper lobectomy. Compensatory hyperexpansion of the right middle and lower lobes. Increasing areas of nodular septal thickening throughout the right lung, most evident in the right middle lobe where the largest of these nodules measures up to 11 x 8 mm (axial image 62 of series 3). Several other scattered tiny 2-5 mm pulmonary nodules are also noted in the left lung. Small right pleural effusion with pleural enhancement Upper Abdomen: Multiple tiny 2-3 mm nonobstructive calculi in the collecting systems of both kidneys. Aortic atherosclerosis. Aortic atherosclerosis. Musculoskeletal: There are no aggressive appearing lytic or blastic lesions noted in the visualized portions of the skeleton. IMPRESSION: 1. Evidence of progressive lymphangitic spread of tumor throughout the right lung, with additional pulmonary nodules in the contralateral lung suspicious for potential hematogenous metastasis. In addition, there is a small right pleural effusion with areas of pleural enhancement, which could indicate a developing malignant pleural effusion. 2. Multiple nonobstructive calculi in the collecting systems of both kidneys. 3. Aortic atherosclerosis, in addition to left anterior descending coronary artery disease. Please note that although the presence of coronary artery calcium documents the presence of coronary artery disease, the severity of this disease and any potential stenosis cannot be assessed on this non-gated CT examination. Assessment for potential risk factor modification, dietary therapy or pharmacologic therapy may be warranted, if clinically indicated. Aortic Atherosclerosis (ICD10-I70.0).  Electronically Signed   By: DQuillian Quince  Entrikin M.D.   On: 06/14/2020 10:41   CT Chest W Contrast  Result Date: 04/21/2020 CLINICAL DATA:  Lung cancer follow-up, squamous cell carcinoma diagnosed 2 years ago. EXAM: CT CHEST WITH CONTRAST TECHNIQUE: Multidetector CT imaging of the chest was performed during intravenous contrast administration. CONTRAST:  32m OMNIPAQUE IOHEXOL 300 MG/ML  SOLN COMPARISON:  February 06, 2020 FINDINGS: Cardiovascular: LEFT-sided Port-A-Cath terminates at the midportion to distal portion of superior vena cava. Heart size is normal. Trace pericardial fluid similar to the prior study. Central pulmonary vasculature is distorted at the RIGHT hilum as before otherwise unremarkable on venous phase assessment. Mediastinum/Nodes: Thoracic inlet structures are normal. No axillary lymphadenopathy. No mediastinal adenopathy. Esophagus grossly normal. Lungs/Pleura: Emphysema. Postoperative changes of RIGHT upper lobectomy. No dense consolidation. Linear pattern of septal thickening extends through the RIGHT mid chest compatible with prior radiotherapy. Scattered nodularity is unchanged. Micro nodules grouped in the anterior LEFT upper lobe with similar appearance. Enlarging peripheral LEFT upper lobe nodules however since the prior study (image 55 of series 3) with a 5 mm nodule that previously was quite indistinct and at best 2 mm. Similar appearance and change in a small 3 mm nodule in the anterior LEFT upper lobe laterally. Scattered millimetric nodules throughout the LEFT chest including a nother small nodule that is approximately 3-4 mm on image 125 of series 3 and at least 4 others that show a similar appearance at the periphery of the LEFT lower lobe on images 112 through 128. No effusion. New area of septal nodularity in the RIGHT lung base on image 110 of series 3 measures 4-5 mm Upper Abdomen: Imaged portions of liver, spleen, pancreas and gastrointestinal tract are unremarkable. The adrenal  glands are normal. Nephrolithiasis as before. Symmetric renal enhancement. Musculoskeletal: No acute bone finding. No destructive bone process. Spinal degenerative changes. IMPRESSION: Increasingly nodular appearance of post treatment changes in the RIGHT chest albeit subtle since the previous study with scattered pulmonary nodules throughout the chest many of these nodules removed from post treatment changes are new since February of 2021 and suspicious for metastatic disease. Aortic atherosclerosis. Aortic Atherosclerosis (ICD10-I70.0). Electronically Signed   By: GZetta BillsM.D.   On: 04/21/2020 17:03     ASSESSMENT & PLAN:  1. Squamous cell carcinoma of lung, right [place]   2. Hypomagnesemia   3. Hypokalemia   4. Goals of care, counseling/discussion    #Recurrent Squamous cell carcinoma of lung,  He finished total 26 cycles of Durvalumab.  06/14/2020 CT chest w contrast was reviewed and discussed with patient.  Evidence of progressive lymphangitic spread of tumor throughout the right lung, with additional pulmonary nodules in the contralateral lung suspicious for potential hematogenous metastasis. In addition, there is a small right pleural effusion with areas of pleural enhancement, which could indicate a developing malignant pleural effusion. Multiple nonobstructive calculi in the collecting systems of both kidneys. -Aortic atherosclerosis  Findings are concerning for recurrence of lung cancer.  I will check PET and MRI brain.  Discussed about future systemic chemotherapy +/- immunotherapy.  Send NGS  #Chronic hypomagnesia.  Continue slow magnesium twice daily. #Chronic hypokalemia, continue oral potassium supplementation 20 mEq today.   #Uncontrolled HTN, BP 188/113, he took his BP medication 45 minutes ago. He did not know his CT results when BP was checked. Asymptomatic. Recommend ER for urgent evaluation of blood pressure.    Return of visit: TBD   ZEarlie Server MD,  PhD [place]  06/17/2020  

## 2020-06-17 NOTE — Telephone Encounter (Signed)
Omniseq testing request faxed to [facility] pathology on specimen #[id number] collected on 12/26/18.

## 2020-06-25 ENCOUNTER — Other Ambulatory Visit: Payer: Self-pay | Admitting: *Deleted

## 2020-06-25 ENCOUNTER — Telehealth: Payer: Self-pay | Admitting: *Deleted

## 2020-06-25 DIAGNOSIS — C3490 Malignant neoplasm of unspecified part of unspecified bronchus or lung: Secondary | ICD-10-CM

## 2020-06-25 NOTE — Telephone Encounter (Signed)
Per [facility] 06/25/20 staff message  1st avail date for a CT is 07/06/20 in [place].. Pt was made aware and stated that he's ok with going to [place]. Details, NPO 4hrs prior, arrive by 10:45 and must pick up prep per Central Scheduling

## 2020-07-01 ENCOUNTER — Other Ambulatory Visit: Payer: Self-pay

## 2020-07-01 ENCOUNTER — Ambulatory Visit
Admission: RE | Admit: 2020-07-01 | Discharge: 2020-07-01 | Disposition: A | Discharge status: Home / Self Care | Payer: Managed Care, Other (non HMO) | Source: Ambulatory Visit | Attending: Oncology | Admitting: Oncology

## 2020-07-01 DIAGNOSIS — C3491 Malignant neoplasm of unspecified part of right bronchus or lung: Secondary | ICD-10-CM | POA: Diagnosis not present

## 2020-07-01 MED ORDER — GADOBUTROL 1 MMOL/ML IV SOLN
6.0000 mL | Freq: Once | INTRAVENOUS | Status: AC | PRN
  Administered 2020-07-01: 6 mL via INTRAVENOUS

## 2020-07-06 ENCOUNTER — Other Ambulatory Visit: Payer: Self-pay

## 2020-07-06 ENCOUNTER — Ambulatory Visit
Admission: RE | Admit: 2020-07-06 | Discharge: 2020-07-06 | Disposition: A | Discharge status: Home / Self Care | Payer: Managed Care, Other (non HMO) | Source: Ambulatory Visit | Attending: Oncology | Admitting: Oncology

## 2020-07-06 DIAGNOSIS — C3490 Malignant neoplasm of unspecified part of unspecified bronchus or lung: Secondary | ICD-10-CM

## 2020-07-06 LAB — POCT I-STAT CREATININE: Creatinine, Ser: 1 mg/dL (ref 0.61–1.24)

## 2020-07-06 MED ORDER — IOHEXOL 300 MG/ML  SOLN
100.0000 mL | Freq: Once | INTRAMUSCULAR | Status: AC | PRN
  Administered 2020-07-06: 100 mL via INTRAVENOUS

## 2020-07-08 ENCOUNTER — Inpatient Hospital Stay (HOSPITAL_BASED_OUTPATIENT_CLINIC_OR_DEPARTMENT_OTHER): Payer: Managed Care, Other (non HMO) | Admitting: Oncology

## 2020-07-08 ENCOUNTER — Encounter: Payer: Self-pay | Admitting: Oncology

## 2020-07-08 VITALS — BP 145/92 | HR 101 | Temp 97.6°F | Resp 16 | Wt 144.4 lb

## 2020-07-08 DIAGNOSIS — C3491 Malignant neoplasm of unspecified part of right bronchus or lung: Secondary | ICD-10-CM | POA: Diagnosis not present

## 2020-07-08 DIAGNOSIS — E876 Hypokalemia: Secondary | ICD-10-CM

## 2020-07-08 DIAGNOSIS — C3411 Malignant neoplasm of upper lobe, right bronchus or lung: Secondary | ICD-10-CM | POA: Diagnosis not present

## 2020-07-08 DIAGNOSIS — Z7189 Other specified counseling: Secondary | ICD-10-CM

## 2020-07-08 NOTE — Progress Notes (Signed)
DISCONTINUE OFF PATHWAY REGIMEN - Non-Small Cell Lung   [id number]:Durvalumab 10 mg/kg q14 Days:   A cycle is every 14 days:     Durvalumab   **Always confirm dose/schedule in your pharmacy ordering system**  REASON: Disease Progression PRIOR TREATMENT: Off Pathway: Durvalumab 10 mg/kg q14 Days TREATMENT RESPONSE: Progressive Disease (PD)  START ON PATHWAY REGIMEN - Non-Small Cell Lung     A cycle is every 21 days:     Pembrolizumab      Paclitaxel      Carboplatin   **Always confirm dose/schedule in your pharmacy ordering system**  Patient Characteristics: Stage IV Metastatic, Squamous, PS = 0, 1, First Line, PD-L1 Expression Positive 1-49% (TPS) / Negative / Not Tested / Awaiting Test Results and Immunotherapy Candidate Therapeutic Status: Stage IV Metastatic Histology: Squamous Cell Line of therapy: First Line ECOG Performance Status: 0 PD-L1 Expression Status: Awaiting Test Results Immunotherapy Candidate Status: Candidate for Immunotherapy Intent of Therapy: Non-Curative / Palliative Intent, Discussed with Patient

## 2020-07-08 NOTE — Progress Notes (Signed)
Patient denies new problems/concerns today.   °

## 2020-07-08 NOTE — Telephone Encounter (Signed)
Contacted [facility] path to follow up on Omniseq results. Spoke to [place] and he said that results should be in tomorrow or possibly on Monday due to the holidays.

## 2020-07-08 NOTE — Progress Notes (Signed)
Hematology/Oncology Follow up note [facility] Telephone:(336) [phone] [phone]) [phone]   Patient Care Team: Tracie Harrier, MD as PCP - General (Internal Medicine) Earlie Server, MD as Consulting Physician (Hematology and Oncology)  REASON FOR VISIT:  Follow up for treatment of squamous lung cancer.   HISTORY OF PRESENTING ILLNESS:  # Dec 2019 Stage I squamous lung cancer #s/p Bronchoscopy on 06/25/2018. subcarina EBUS FNA was non diagnostic, hypocellular specimen.  # 08/13/2018 CT guided right upper lobe biopsy pathology showed dense fibrosis and mixed inflammatory cells with prominent polytypic plasma cells component. Focal benign bronchial wall and alveolar spaces. No malignancy was identified.   # 08/13/2018 underwent right thoracotomy and wedge resection of a right upper lobe mass.  Frozen section was consistent with an inflammatory process.  On the second postop day, preliminary pathology reports high-grade malignancy.  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  08/20/2018 Patient was therefore taken back to the OR and underwent take complete lobectomy  pT1b pN0 cM0 stage I squamous cell lung cancer.  Margin is negative.  Recommend observation.    # 12/03/2018 CT chest w contrast showed local recurrence.  12/10/2018 PET showed No evidence of distant metastatic disease # 12/26/2018 s/p bronchoscopy biopsy. Confirmed local recurrence of squamous cell lung cancer.  medi port placed by Dr.Oaks.  Cancer treatment Started concurrent chemoradiation on 01/16/2019 Carboplatin AUC of 2 and Taxol 45 mg/m2 weekly finished in 03/05/2019 04/10/2019, patient started on durvalumab maintenance.  INTERVAL HISTORY Samuel Choi is a 54 y.o. male who has above history reviewed by me today presents for evaluation prior to immunotherapy for treatment of  recurrent  squamous cell lung cancer. Denies new complaints.   Review of Systems  Constitutional: Positive for fatigue.  Negative for appetite change, chills, fever and unexpected weight change.  HENT:   Negative for hearing loss and voice change.   Eyes: Negative for eye problems and icterus.  Respiratory: Negative for chest tightness, cough and shortness of breath.   Cardiovascular: Negative for chest pain and leg swelling.  Gastrointestinal: Negative for abdominal distention and abdominal pain.  Endocrine: Negative for hot flashes.  Genitourinary: Negative for difficulty urinating, dysuria and frequency.   Musculoskeletal: Negative for arthralgias.  Skin: Negative for itching and rash.  Neurological: Negative for light-headedness and numbness.  Hematological: Negative for adenopathy. Does not bruise/bleed easily.  Psychiatric/Behavioral: Negative for confusion.    MEDICAL HISTORY:  Past Medical History:  Diagnosis Date  . Alcohol abuse    usually drinks 2-3 drinks per day  . Atherosclerosis 06/2018  . Chronic sinusitis   . Dehydration 02/07/2019  . Emphysema of lung ([place]) 06/2018   patient unaware of this.  . Hip fracture ([place]) 05/2018   no surgery  . History of kidney stones 05/2018   per xray, bilateral nephrolitiasis  . Hypertension   . Squamous cell carcinoma of lung, right ([place]) 06/2018    SURGICAL HISTORY: Past Surgical History:  Procedure Laterality Date  . BRAIN SURGERY  10/2017   nasal/sinus endoscopy. mass benign  . ELECTROMAGNETIC NAVIGATION BROCHOSCOPY Right 06/25/2018   Procedure: ELECTROMAGNETIC NAVIGATION BRONCHOSCOPY;  Surgeon: [name], MD;  Location: [facility] ORS;  Service: Cardiopulmonary;  Laterality: Right;  . ENDOBRONCHIAL ULTRASOUND Right 06/25/2018   Procedure: ENDOBRONCHIAL ULTRASOUND;  Surgeon: [name], MD;  Location: [facility] OR[facility];  Service: Cardiopulmonary;  Laterality: Right;  . ENDOBRONCHIAL ULTRASOUND Right 12/26/2018   Procedure: ENDOBRONCHIAL ULTRASOUND RIGHT;  Surgeon: [name], MD;  Location: [facility] OR[facility];  Service: Cardiopulmonary;  Laterality: Right;  .  FLEXIBLE BRONCHOSCOPY N/A 08/20/2018   Procedure: FLEXIBLE BRONCHOSCOPY PREOP;  Surgeon: [name], MD;  Location: [facility] ORS;  Service: Thoracic;  Laterality: N/A;  . IR CV LINE INJECTION  04/04/2019  . NASAL SINUS SURGERY  10/2017   At [facility], frontal sinusotomy, ethmoidectomy, resection anterior cranial fossa neoplasm, turbinate resection  . PORTACATH PLACEMENT Left 01/15/2019   Procedure: INSERTION PORT-A-CATH;  Surgeon: [name], MD;  Location: [facility] ORS;  Service: General;  Laterality: Left;  . THORACOTOMY Right 08/13/2018   Procedure: PREOP BROCHOSCOPY WITH RIGHT THORACOTOMY AND RUL RESECTION;  Surgeon: [name], MD;  Location: [facility] OR[facility];  Service: General;  Laterality: Right;  . THORACOTOMY Right 08/20/2018   Procedure: THORACOTOMY MAJOR RIGHT UPPER LOBE LOBECTOMY;  Surgeon: [name], MD;  Location: [facility] OR[facility];  Service: Thoracic;  Laterality: Right;  . TOE SURGERY Left    pin in left toe    SOCIAL HISTORY: Social History   Socioeconomic History  . Marital status: Married    Spouse name: lisa  . Number of children: Not on file  . Years of education: Not on file  . Highest education level: Not on file  Occupational History  . Occupation: welding    Comment: taking time off to resolve issues  Tobacco Use  . Smoking status: Former Smoker    Packs/day: 0.50    Years: 15.00    Pack years: 7.50    Types: Cigarettes    Quit date: 08/2018    Years since quitting: 1.9  . Smokeless tobacco: Never Used  Vaping Use  . Vaping Use: Never used  Substance and Sexual Activity  . Alcohol use: Yes    Alcohol/week: 3.0 standard drinks    Types: 3 Cans of beer per week    Comment: usually 2 drinks per week, per patient  . Drug use: No  . Sexual activity: Not on file  Other Topics Concern  . Not on file  Social History Narrative  . Not on file   Social Determinants of Health   Financial Resource Strain: Not on file  Food Insecurity: Not on file  Transportation Needs: Not on  file  Physical Activity: Not on file  Stress: Not on file  Social Connections: Not on file  Intimate Partner Violence: Not on file    FAMILY HISTORY: Family History  Problem Relation Age of Onset  . Breast cancer Mother   . Diabetes Mother   . Lung cancer Father   . Hypertension Father     ALLERGIES:  has No Known Allergies.  MEDICATIONS:  Current Outpatient Medications  Medication Sig Dispense Refill  . albuterol (VENTOLIN HFA) 108 (90 Base) MCG/ACT inhaler TAKE 2 PUFFS BY MOUTH EVERY 6 HOURS AS NEEDED FOR WHEEZE OR SHORTNESS OF BREATH 6.7 each 6  . amoxicillin (AMOXIL) 875 MG tablet Take 875 mg by mouth 2 (two) times daily.    . cloNIDine (CATAPRES) 0.1 MG tablet Take 0.1 mg by mouth daily.   5  . diltiazem (CARDIZEM CD) 120 MG 24 hr capsule Take 120 mg by mouth daily.    . folic acid (V-R FOLIC ACID) 256 MCG tablet Take 1 tablet (400 mcg total) by mouth daily. 90 tablet 1  . hydrALAZINE (APRESOLINE) 100 MG tablet Take 50 mg by mouth 2 (two) times daily.     . hydrochlorothiazide (HYDRODIURIL) 12.5 MG tablet Take 12.5 mg by mouth daily.     Marland Kitchen ibuprofen (ADVIL) 600 MG tablet Take 600 mg  by mouth every 6 (six) hours.    Marland Kitchen KLOR-CON M20 20 MEQ tablet TAKE 1 TABLET BY MOUTH TWICE A DAY 60 tablet 0  . latanoprost (XALATAN) 0.005 % ophthalmic solution 1 drop at bedtime.    . lidocaine-prilocaine (EMLA) cream Apply to affected area once 30 g 3  . magnesium chloride (SLOW-MAG) 64 MG TBEC SR tablet Take 1 tablet (64 mg total) by mouth 2 (two) times daily. TAKE 1 TABLET (64 MG TOTAL) BY MOUTH DAILY. 60 tablet 1  . methocarbamol (ROBAXIN) 500 MG tablet Take 1 tablet (500 mg total) by mouth at bedtime. 30 tablet 0  . olmesartan (BENICAR) 40 MG tablet Take 40 mg by mouth every other day.     . ondansetron (ZOFRAN) 8 MG tablet Take 1 tablet (8 mg total) by mouth 2 (two) times daily as needed for refractory nausea / vomiting. Start on day 3 after chemo. 30 tablet 1  . prochlorperazine  (COMPAZINE) 10 MG tablet Take 1 tablet (10 mg total) by mouth every 6 (six) hours as needed (Nausea or vomiting). 30 tablet 1  . SPIRIVA HANDIHALER 18 MCG inhalation capsule INHALE 1 CAPSULE VIA HANDIHALER ONCE DAILY AT THE SAME TIME EVERY DAY 30 capsule 0  . SYMBICORT 80-4.5 MCG/ACT inhaler INHALE 2 PUFFS INTO THE LUNGS IN THE MORNING AND AT BEDTIME. 10.2 each 6  . vitamin B-12 (CYANOCOBALAMIN) 1000 MCG tablet Take 1 tablet (1,000 mcg total) by mouth daily. 90 tablet 1   No current facility-administered medications for this visit.   Facility-Administered Medications Ordered in Other Visits  Medication Dose Route Frequency Provider Last Rate Last Admin  . albuterol (PROVENTIL) (2.5 MG/3ML) 0.083% nebulizer solution 2.5 mg  2.5 mg Nebulization Once System, Provider Not In      . heparin lock flush 100 unit/mL  500 Units Intravenous Once Earlie Server, MD      . sodium chloride flush (NS) 0.9 % injection 10 mL  10 mL Intravenous PRN Earlie Server, MD   10 mL at 04/04/19 0903     PHYSICAL EXAMINATION: ECOG PERFORMANCE STATUS: 0 - Asymptomatic Vitals:   07/08/20 0956  BP: (!) 145/92  Pulse: (!) 101  Resp: 16  Temp: 97.6 F (36.4 C)   Filed Weights   07/08/20 0956  Weight: 144 lb 6.4 oz (65.5 kg)    Physical Exam Constitutional:      General: He is not in acute distress. HENT:     Head: Normocephalic and atraumatic.  Eyes:     General: No scleral icterus. Cardiovascular:     Rate and Rhythm: Normal rate and regular rhythm.     Heart sounds: Normal heart sounds.  Pulmonary:     Effort: Pulmonary effort is normal. No respiratory distress.     Breath sounds: No wheezing.  Abdominal:     General: Bowel sounds are normal. There is no distension.     Palpations: Abdomen is soft.  Musculoskeletal:        General: No deformity. Normal range of motion.     Cervical back: Normal range of motion and neck supple.  Skin:    General: Skin is warm and dry.     Findings: No erythema or rash.   Neurological:     Mental Status: He is alert and oriented to person, place, and time. Mental status is at baseline.     Cranial Nerves: No cranial nerve deficit.     Coordination: Coordination normal.  Psychiatric:  Mood and Affect: Mood normal.      LABORATORY DATA:  I have reviewed the data as listed Lab Results  Component Value Date   WBC 7.8 05/20/2020   HGB 13.4 05/20/2020   HCT 38.9 (L) 05/20/2020   MCV 100.8 (H) 05/20/2020   PLT 174 05/20/2020   Recent Labs    03/17/20 0830 03/31/20 0825 04/07/20 0854 04/22/20 0825 05/06/20 0806 05/20/20 0818 07/06/20 1204  NA 134* 137 137 135 140 140  --   K 4.6 3.9 3.7 4.3 3.5 3.9  --   CL 101 102 104 102 105 104  --   CO2 20* 23 21* _0 --   GLUCOSE 167* 126* 163* 99 98 100*  --   BUN _1 --   CREATININE 1.19 1.10 1.10 1.04 0.89 1.05 1.00  CALCIUM 9.0 9.1 8.8* 9.0 8.8* 8.7*  --   GFRNONAA >60 >60 >60 >60 >60 >60  --   GFRAA >60 >60 >60  --   --   --   --   PROT 7.7 7.6 7.1 7.6 7.5 7.1  --   ALBUMIN 3.5 3.5 3.4* 3.7 3.5 3.3*  --   AST 32 _2 38 26  --   ALT _3 34 21  --   ALKPHOS 86 87 76 84 110 98  --   BILITOT 0.8 0.7 0.5 0.9 0.6 0.6  --    Iron/TIBC/Ferritin/ %Sat No results found for: IRON, TIBC, FERRITIN, IRONPCTSAT   RADIOGRAPHIC STUDIES: I have personally reviewed the radiological images as listed and agreed with the findings in the report. CT Chest W Contrast  Result Date: 06/14/2020 CLINICAL DATA:  53 year old male with history of lung cancer. Follow-up study. EXAM: CT CHEST WITH CONTRAST TECHNIQUE: Multidetector CT imaging of the chest was performed during intravenous contrast administration. CONTRAST:  29m OMNIPAQUE IOHEXOL 300 MG/ML  SOLN COMPARISON:  Chest CT 04/21/2020. FINDINGS: Cardiovascular: Heart size is normal. Small amount of pericardial fluid and/or thickening, increased compared to the prior study, but unlikely to be of hemodynamic significance at this  time. No associated pericardial calcification. There is aortic atherosclerosis, as well as atherosclerosis of the great vessels of the mediastinum and the coronary arteries, including calcified atherosclerotic plaque in the left anterior descending coronary artery. Left-sided subclavian single-lumen porta cath with tip terminating in the distal superior vena cava. Mediastinum/Nodes: No pathologically enlarged mediastinal or hilar lymph nodes. Esophagus is unremarkable in appearance. No axillary lymphadenopathy. Lungs/Pleura: Status post right upper lobectomy. Compensatory hyperexpansion of the right middle and lower lobes. Increasing areas of nodular septal thickening throughout the right lung, most evident in the right middle lobe where the largest of these nodules measures up to 11 x 8 mm (axial image 62 of series 3). Several other scattered tiny 2-5 mm pulmonary nodules are also noted in the left lung. Small right pleural effusion with pleural enhancement Upper Abdomen: Multiple tiny 2-3 mm nonobstructive calculi in the collecting systems of both kidneys. Aortic atherosclerosis. Aortic atherosclerosis. Musculoskeletal: There are no aggressive appearing lytic or blastic lesions noted in the visualized portions of the skeleton. IMPRESSION: 1. Evidence of progressive lymphangitic spread of tumor throughout the right lung, with additional pulmonary nodules in the contralateral lung suspicious for potential hematogenous metastasis. In addition, there is a small right pleural effusion with areas of pleural enhancement, which could indicate a developing malignant pleural effusion. 2. Multiple nonobstructive calculi in the collecting systems  of both kidneys. 3. Aortic atherosclerosis, in addition to left anterior descending coronary artery disease. Please note that although the presence of coronary artery calcium documents the presence of coronary artery disease, the severity of this disease and any potential stenosis  cannot be assessed on this non-gated CT examination. Assessment for potential risk factor modification, dietary therapy or pharmacologic therapy may be warranted, if clinically indicated. Aortic Atherosclerosis (ICD10-I70.0). Electronically Signed   By: Vinnie Langton M.D.   On: 06/14/2020 10:41   CT Chest W Contrast  Result Date: 04/21/2020 CLINICAL DATA:  Lung cancer follow-up, squamous cell carcinoma diagnosed 2 years ago. EXAM: CT CHEST WITH CONTRAST TECHNIQUE: Multidetector CT imaging of the chest was performed during intravenous contrast administration. CONTRAST:  4m OMNIPAQUE IOHEXOL 300 MG/ML  SOLN COMPARISON:  February 06, 2020 FINDINGS: Cardiovascular: LEFT-sided Port-A-Cath terminates at the midportion to distal portion of superior vena cava. Heart size is normal. Trace pericardial fluid similar to the prior study. Central pulmonary vasculature is distorted at the RIGHT hilum as before otherwise unremarkable on venous phase assessment. Mediastinum/Nodes: Thoracic inlet structures are normal. No axillary lymphadenopathy. No mediastinal adenopathy. Esophagus grossly normal. Lungs/Pleura: Emphysema. Postoperative changes of RIGHT upper lobectomy. No dense consolidation. Linear pattern of septal thickening extends through the RIGHT mid chest compatible with prior radiotherapy. Scattered nodularity is unchanged. Micro nodules grouped in the anterior LEFT upper lobe with similar appearance. Enlarging peripheral LEFT upper lobe nodules however since the prior study (image 55 of series 3) with a 5 mm nodule that previously was quite indistinct and at best 2 mm. Similar appearance and change in a small 3 mm nodule in the anterior LEFT upper lobe laterally. Scattered millimetric nodules throughout the LEFT chest including a nother small nodule that is approximately 3-4 mm on image 125 of series 3 and at least 4 others that show a similar appearance at the periphery of the LEFT lower lobe on images 112 through  128. No effusion. New area of septal nodularity in the RIGHT lung base on image 110 of series 3 measures 4-5 mm Upper Abdomen: Imaged portions of liver, spleen, pancreas and gastrointestinal tract are unremarkable. The adrenal glands are normal. Nephrolithiasis as before. Symmetric renal enhancement. Musculoskeletal: No acute bone finding. No destructive bone process. Spinal degenerative changes. IMPRESSION: Increasingly nodular appearance of post treatment changes in the RIGHT chest albeit subtle since the previous study with scattered pulmonary nodules throughout the chest many of these nodules removed from post treatment changes are new since February of 2021 and suspicious for metastatic disease. Aortic atherosclerosis. Aortic Atherosclerosis (ICD10-I70.0). Electronically Signed   By: GZetta BillsM.D.   On: 04/21/2020 17:03   CT Abdomen W Contrast  Result Date: 07/06/2020 CLINICAL DATA:  Right lung squamous cell carcinoma.  Staging. EXAM: CT ABDOMEN WITH CONTRAST TECHNIQUE: Multidetector CT imaging of the abdomen was performed using the standard protocol following bolus administration of intravenous contrast. CONTRAST:  1093mOMNIPAQUE IOHEXOL 300 MG/ML  SOLN COMPARISON:  05/09/2018 from [facility] FINDINGS: Lower chest: Tiny right pleural effusion, as seen on recent chest CT. Hepatobiliary: No hepatic masses identified. Gallbladder is unremarkable. No evidence of biliary ductal dilatation. Pancreas:  No mass or inflammatory changes. Spleen:  Within normal limits in size and appearance. Adrenals/Urinary Tract: Normal adrenal glands. Mild right renal parenchymal scarring. Stable tiny cyst in lower pole of left kidney. No masses identified. A few tiny 1-2 mm renal calculi are again seen bilaterally, however there is no evidence of hydronephrosis.  Stomach/Bowel: Visualized portion unremarkable. Vascular/Lymphatic: No pathologically enlarged lymph nodes identified. No abdominal  aortic aneurysm. Aortic atherosclerotic calcification noted. Other:  None. Musculoskeletal:  No suspicious bone lesions identified. IMPRESSION: No evidence of abdominal metastatic disease or other acute findings. Bilateral nephrolithiasis. No evidence of hydronephrosis. Electronically Signed   By: Marlaine Hind M.D.   On: 07/06/2020 15:49   MR Brain W Wo Contrast  Result Date: 07/01/2020 CLINICAL DATA:  Lung cancer patient.  Yearly follow-up. EXAM: MRI HEAD WITHOUT AND WITH CONTRAST TECHNIQUE: Multiplanar, multiecho pulse sequences of the brain and surrounding structures were obtained without and with intravenous contrast. CONTRAST:  73m GADAVIST GADOBUTROL 1 MMOL/ML IV SOLN COMPARISON:  01/20/2019 FINDINGS: Brain: Mild generalized volume loss. Diffusion imaging does not show any acute or subacute infarction or other cause of restricted diffusion. Mild chronic small-vessel change affects the pons. No focal cerebellar finding. Cerebral hemispheres show mild to moderate chronic small-vessel change of the white matter, advanced for age. No cortical or large vessel territory infarction. No mass lesion, hemorrhage, hydrocephalus or extra-axial collection. After contrast administration, no abnormal enhancement occurs. Vascular: Major vessels at the base of the brain show flow. Skull and upper cervical spine: Negative Sinuses/Orbits: Clear sinuses presently. Previous functional endoscopic sinus surgery. No acute orbital finding. Dysconjugate gaze. Other: None IMPRESSION: 1. No evidence of metastatic disease. 2. Brain atrophy and chronic small-vessel change of the pons and cerebral hemispheric white matter, advanced for age. Electronically Signed   By: MNelson ChimesM.D.   On: 07/01/2020 08:55     ASSESSMENT & PLAN:  1. [place]   2. Goals of care, counseling/discussion   3. Hypomagnesemia   4. Hypokalemia    #Recurrent Squamous cell carcinoma of lung,  He finished total 26  cycles of Durvalumab.  06/14/2020 CT chest w contrast was reviewed and discussed with patient.  Evidence of progressive lymphangitic spread of tumor throughout the right lung, with contralateral lung nodules with  right pleural effusion  MRI brain is negative for CNS involvement PET scan was not approved by insurance-peer to peer appeal with Dr.Vipul Bhanderi  Obtained CT abdomen pelvis which did not show extrathoracic metastasis.  Discussed with patient about image findings.  Rebiopsy can be considered patient is not interested in invasive work-up. I recommend starting systemic chemotherapy with immunotherapy.  NGS has been sent and is pending. If no targetable mutations, plan carboplatin/Taxol/Keytruda.  Rationale of treatment and potential side effects were discussed with patient.  He agrees with the plan. Goals of care is discussed.  Patient understands that he has stage IV disease, not curable.  Treatment is with palliative intent. #Chronic hypomagnesia.  Continue slow magnesium twice daily. #Chronic hypokalemia, continue oral potassium supplementation 20 mEq today.   .  Return of visit: 1 week  ZEarlie Server MD, PhD Hematology Oncology [facility]12/30/2021

## 2020-07-12 ENCOUNTER — Encounter: Payer: Self-pay | Admitting: Oncology

## 2020-07-13 NOTE — Telephone Encounter (Signed)
Results available in patient's chart.

## 2020-07-14 ENCOUNTER — Other Ambulatory Visit: Payer: Self-pay | Admitting: Oncology

## 2020-07-14 DIAGNOSIS — C3491 Malignant neoplasm of unspecified part of right bronchus or lung: Secondary | ICD-10-CM

## 2020-07-14 NOTE — Progress Notes (Signed)
ON PATHWAY REGIMEN - Non-Small Cell Lung  No Change  Continue With Treatment as Ordered.  Original Decision Date/Time: 07/08/2020 10:07     A cycle is every 21 days:     Pembrolizumab      Paclitaxel      Carboplatin   **Always confirm dose/schedule in your pharmacy ordering system**  Patient Characteristics: Stage IV Metastatic, Squamous, PS = 0, 1, First Line, PD-L1 Expression Positive 1-49% (TPS) / Negative / Not Tested / Awaiting Test Results and Immunotherapy Candidate Therapeutic Status: Stage IV Metastatic Histology: Squamous Cell Line of therapy: First Line ECOG Performance Status: 0 PD-L1 Expression Status: Awaiting Test Results Immunotherapy Candidate Status: Candidate for Immunotherapy Intent of Therapy: Non-Curative / Palliative Intent, Discussed with Patient

## 2020-07-14 NOTE — Progress Notes (Signed)
DISCONTINUE ON PATHWAY REGIMEN - Non-Small Cell Lung     A cycle is every 21 days:     Pembrolizumab      Paclitaxel      Carboplatin   **Always confirm dose/schedule in your pharmacy ordering system**  REASON: Other Reason PRIOR TREATMENT: [id number]: Pembrolizumab 200 mg + Carboplatin AUC=6 + Paclitaxel 200 mg/m2 q21 Days x 4 Cycles  START ON PATHWAY REGIMEN - Non-Small Cell Lung     A cycle is every 21 days:     Pembrolizumab      Paclitaxel      Carboplatin   **Always confirm dose/schedule in your pharmacy ordering system**  Patient Characteristics: Stage IV Metastatic, Squamous, PS = 0, 1, First Line, PD-L1 Expression Positive 1-49% (TPS) / Negative / Not Tested / Awaiting Test Results and Immunotherapy Candidate Therapeutic Status: Stage IV Metastatic Histology: Squamous Cell Line of therapy: First Line ECOG Performance Status: 0 PD-L1 Expression Status: PD-L1 Positive 1-49% (TPS) Immunotherapy Candidate Status: Candidate for Immunotherapy Intent of Therapy: Non-Curative / Palliative Intent, Discussed with Patient

## 2020-07-16 ENCOUNTER — Inpatient Hospital Stay (HOSPITAL_BASED_OUTPATIENT_CLINIC_OR_DEPARTMENT_OTHER): Payer: 59 | Admitting: Oncology

## 2020-07-16 ENCOUNTER — Inpatient Hospital Stay: Payer: 59 | Attending: Oncology

## 2020-07-16 ENCOUNTER — Inpatient Hospital Stay: Payer: 59

## 2020-07-16 ENCOUNTER — Encounter: Payer: Self-pay | Admitting: Oncology

## 2020-07-16 VITALS — BP 149/99 | HR 88 | Ht 73.0 in

## 2020-07-16 VITALS — BP 182/100 | HR 96 | Temp 97.5°F | Resp 16 | Wt 145.3 lb

## 2020-07-16 DIAGNOSIS — Z801 Family history of malignant neoplasm of trachea, bronchus and lung: Secondary | ICD-10-CM | POA: Diagnosis not present

## 2020-07-16 DIAGNOSIS — I10 Essential (primary) hypertension: Secondary | ICD-10-CM | POA: Diagnosis not present

## 2020-07-16 DIAGNOSIS — Z5112 Encounter for antineoplastic immunotherapy: Secondary | ICD-10-CM | POA: Diagnosis not present

## 2020-07-16 DIAGNOSIS — E876 Hypokalemia: Secondary | ICD-10-CM | POA: Diagnosis not present

## 2020-07-16 DIAGNOSIS — D6959 Other secondary thrombocytopenia: Secondary | ICD-10-CM | POA: Diagnosis not present

## 2020-07-16 DIAGNOSIS — Z79899 Other long term (current) drug therapy: Secondary | ICD-10-CM | POA: Insufficient documentation

## 2020-07-16 DIAGNOSIS — R059 Cough, unspecified: Secondary | ICD-10-CM

## 2020-07-16 DIAGNOSIS — Z5111 Encounter for antineoplastic chemotherapy: Secondary | ICD-10-CM | POA: Insufficient documentation

## 2020-07-16 DIAGNOSIS — E860 Dehydration: Secondary | ICD-10-CM

## 2020-07-16 DIAGNOSIS — Z8249 Family history of ischemic heart disease and other diseases of the circulatory system: Secondary | ICD-10-CM | POA: Diagnosis not present

## 2020-07-16 DIAGNOSIS — Z833 Family history of diabetes mellitus: Secondary | ICD-10-CM | POA: Insufficient documentation

## 2020-07-16 DIAGNOSIS — C3411 Malignant neoplasm of upper lobe, right bronchus or lung: Secondary | ICD-10-CM | POA: Diagnosis present

## 2020-07-16 DIAGNOSIS — Z87891 Personal history of nicotine dependence: Secondary | ICD-10-CM | POA: Diagnosis not present

## 2020-07-16 DIAGNOSIS — C3491 Malignant neoplasm of unspecified part of right bronchus or lung: Secondary | ICD-10-CM | POA: Diagnosis not present

## 2020-07-16 DIAGNOSIS — E861 Hypovolemia: Secondary | ICD-10-CM | POA: Diagnosis not present

## 2020-07-16 DIAGNOSIS — Z803 Family history of malignant neoplasm of breast: Secondary | ICD-10-CM | POA: Insufficient documentation

## 2020-07-16 LAB — COMPREHENSIVE METABOLIC PANEL
ALT: 15 U/L (ref 0–44)
AST: 20 U/L (ref 15–41)
Albumin: 3.8 g/dL (ref 3.5–5.0)
Alkaline Phosphatase: 93 U/L (ref 38–126)
Anion gap: 12 (ref 5–15)
BUN: 9 mg/dL (ref 6–20)
CO2: 22 mmol/L (ref 22–32)
Calcium: 9.3 mg/dL (ref 8.9–10.3)
Chloride: 103 mmol/L (ref 98–111)
Creatinine, Ser: 1.04 mg/dL (ref 0.61–1.24)
GFR, Estimated: >60 mL/min (ref >60)
Glucose, Bld: 101 mg/dL — ABNORMAL HIGH (ref 70–99)
Potassium: 4.2 mmol/L (ref 3.5–5.1)
Sodium: 137 mmol/L (ref 135–145)
Total Bilirubin: 1.2 mg/dL (ref 0.3–1.2)
Total Protein: 7.7 g/dL (ref 6.5–8.1)

## 2020-07-16 LAB — CBC WITH DIFFERENTIAL/PLATELET
Abs Immature Granulocytes: 0.03 10*3/uL (ref 0.00–0.07)
Basophils Absolute: 0.1 10*3/uL (ref 0.0–0.1)
Basophils Relative: 1 %
Eosinophils Absolute: 0.1 10*3/uL (ref 0.0–0.5)
Eosinophils Relative: 1 %
HCT: 43.1 % (ref 39.0–52.0)
Hemoglobin: 15.3 g/dL (ref 13.0–17.0)
Immature Granulocytes: 0 %
Lymphocytes Relative: 9 %
Lymphs Abs: 1 10*3/uL (ref 0.7–4.0)
MCH: 34.9 pg — ABNORMAL HIGH (ref 26.0–34.0)
MCHC: 35.5 g/dL (ref 30.0–36.0)
MCV: 98.4 fL (ref 80.0–100.0)
Monocytes Absolute: 0.9 10*3/uL (ref 0.1–1.0)
Monocytes Relative: 9 %
Neutro Abs: 8.9 10*3/uL — ABNORMAL HIGH (ref 1.7–7.7)
Neutrophils Relative %: 80 %
Platelets: 161 10*3/uL (ref 150–400)
RBC: 4.38 MIL/uL (ref 4.22–5.81)
RDW: 13.5 % (ref 11.5–15.5)
WBC: 11 10*3/uL — ABNORMAL HIGH (ref 4.0–10.5)
nRBC: 0 % (ref 0.0–0.2)

## 2020-07-16 LAB — TSH: TSH: 1.069 u[IU]/mL (ref 0.350–4.500)

## 2020-07-16 MED ORDER — FAMOTIDINE IN NACL 20-0.9 MG/50ML-% IV SOLN
20.0000 mg | Freq: Once | INTRAVENOUS | Status: AC
  Administered 2020-07-16: 20 mg via INTRAVENOUS
  Filled 2020-07-16: qty 50

## 2020-07-16 MED ORDER — SODIUM CHLORIDE 0.9 % IV SOLN
Freq: Once | INTRAVENOUS | Status: AC
  Filled 2020-07-16: qty 250

## 2020-07-16 MED ORDER — SODIUM CHLORIDE 0.9 % IV SOLN
10.0000 mg | Freq: Once | INTRAVENOUS | Status: AC
  Administered 2020-07-16: 10 mg via INTRAVENOUS
  Filled 2020-07-16: qty 10

## 2020-07-16 MED ORDER — SODIUM CHLORIDE 0.9% FLUSH
10.0000 mL | Freq: Once | INTRAVENOUS | Status: AC
  Administered 2020-07-16: 10 mL via INTRAVENOUS
  Filled 2020-07-16: qty 10

## 2020-07-16 MED ORDER — LORAZEPAM 2 MG/ML IJ SOLN
0.5000 mg | Freq: Once | INTRAMUSCULAR | Status: AC
  Administered 2020-07-16: 0.5 mg via INTRAVENOUS
  Filled 2020-07-16: qty 1

## 2020-07-16 MED ORDER — HEPARIN SOD (PORK) LOCK FLUSH 100 UNIT/ML IV SOLN
500.0000 [IU] | Freq: Once | INTRAVENOUS | Status: AC | PRN
  Administered 2020-07-16: 500 [IU]
  Filled 2020-07-16: qty 5

## 2020-07-16 MED ORDER — SODIUM CHLORIDE 0.9 % IV SOLN
150.0000 mg | Freq: Once | INTRAVENOUS | Status: AC
  Administered 2020-07-16: 150 mg via INTRAVENOUS
  Filled 2020-07-16: qty 150

## 2020-07-16 MED ORDER — SODIUM CHLORIDE 0.9 % IV SOLN
200.0000 mg | Freq: Once | INTRAVENOUS | Status: AC
  Administered 2020-07-16: 200 mg via INTRAVENOUS
  Filled 2020-07-16: qty 8

## 2020-07-16 MED ORDER — SODIUM CHLORIDE 0.9 % IV SOLN
503.5000 mg | Freq: Once | INTRAVENOUS | Status: AC
  Administered 2020-07-16: 500 mg via INTRAVENOUS
  Filled 2020-07-16: qty 50

## 2020-07-16 MED ORDER — PALONOSETRON HCL INJECTION 0.25 MG/5ML
0.2500 mg | Freq: Once | INTRAVENOUS | Status: AC
  Administered 2020-07-16: 0.25 mg via INTRAVENOUS
  Filled 2020-07-16: qty 5

## 2020-07-16 MED ORDER — DIPHENHYDRAMINE HCL 50 MG/ML IJ SOLN
50.0000 mg | Freq: Once | INTRAMUSCULAR | Status: AC
  Administered 2020-07-16: 50 mg via INTRAVENOUS
  Filled 2020-07-16: qty 1

## 2020-07-16 MED ORDER — LORAZEPAM 2 MG/ML IJ SOLN: 0.5000 mg | Freq: Once | INTRAMUSCULAR | Status: DC | PRN

## 2020-07-16 NOTE — Progress Notes (Signed)
Pt here for follow up and treatment. BP elevated this morning: 20/114 and 200/110. Pt states that he took bp medication this morning and denies headache, dizziness or light headedness.

## 2020-07-16 NOTE — Progress Notes (Signed)
Hematology/Oncology Follow up note [facility] Telephone:(336) [phone] Fax:(336) [phone]   Patient Care Team: Tracie Harrier, MD as PCP - General (Internal Medicine) Earlie Server, MD as Consulting Physician (Hematology and Oncology)  REASON FOR VISIT:  Follow up for treatment of squamous lung cancer.   HISTORY OF PRESENTING ILLNESS:  # Dec 2019 Stage I squamous lung cancer #s/p Bronchoscopy on 06/25/2018. subcarina EBUS FNA was non diagnostic, hypocellular specimen.  # 08/13/2018 CT guided right upper lobe biopsy pathology showed dense fibrosis and mixed inflammatory cells with prominent polytypic plasma cells component. Focal benign bronchial wall and alveolar spaces. No malignancy was identified.   # 08/13/2018 underwent right thoracotomy and wedge resection of a right upper lobe mass.  Frozen section was consistent with an inflammatory process.  On the second postop day, preliminary pathology reports high-grade malignancy.  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  08/20/2018 Patient was therefore taken back to the OR and underwent take complete lobectomy  pT1b pN0 cM0 stage I squamous cell lung cancer.  Margin is negative.  Recommend observation.    # 12/03/2018 CT chest w contrast showed local recurrence.  12/10/2018 PET showed No evidence of distant metastatic disease # 12/26/2018 s/p bronchoscopy biopsy. Confirmed local recurrence of squamous cell lung cancer.  medi port placed by Dr.Oaks.  # 06/14/2020 CT chest w contrast was reviewed and discussed with patient.  Evidence of progressive lymphangitic spread of tumor throughout the right lung, with contralateral lung nodules with  right pleural effusion  MRI brain is negative for CNS involvement.  CT abdomen with contrast showed no evidence of abdominal metastatic disease PET scan was not approved by insurance-peer to peer appeal with Dr.Vipul Bhanderi   Cancer treatment Started concurrent chemoradiation  on 01/16/2019 Carboplatin AUC of 2 and Taxol 45 mg/m2 weekly finished in 03/05/2019 04/10/2019, patient started on durvalumab maintenance.  INTERVAL HISTORY Samuel Choi is a 55 y.o. male who has above history reviewed by me today presents for evaluation prior to immunotherapy for treatment of  recurrent  squamous cell lung cancer. Denies new complaints. Blood pressure is high in the clinic.  Patient endorses being anxious prior to the chemotherapy. No headache, focal deficit.  No nausea vomiting diarrhea.  Denies any cough or shortness of breath. Review of Systems  Constitutional: Positive for fatigue. Negative for appetite change, chills, fever and unexpected weight change.  HENT:   Negative for hearing loss and voice change.   Eyes: Negative for eye problems and icterus.  Respiratory: Negative for chest tightness, cough and shortness of breath.   Cardiovascular: Negative for chest pain and leg swelling.  Gastrointestinal: Negative for abdominal distention and abdominal pain.  Endocrine: Negative for hot flashes.  Genitourinary: Negative for difficulty urinating, dysuria and frequency.   Musculoskeletal: Negative for arthralgias.  Skin: Negative for itching and rash.  Neurological: Negative for light-headedness and numbness.  Hematological: Negative for adenopathy. Does not bruise/bleed easily.  Psychiatric/Behavioral: Negative for confusion.    MEDICAL HISTORY:  Past Medical History:  Diagnosis Date  . Alcohol abuse    usually drinks 2-3 drinks per day  . Atherosclerosis 06/2018  . Chronic sinusitis   . Dehydration 02/07/2019  . Emphysema of lung ([place]) 06/2018   patient unaware of this.  . Hip fracture ([place]) 05/2018   no surgery  . History of kidney stones 05/2018   per xray, bilateral nephrolitiasis  . Hypertension   . Squamous cell carcinoma of lung, right ([place]) 06/2018  SURGICAL HISTORY: Past Surgical History:  Procedure Laterality Date  . BRAIN SURGERY  10/2017    nasal/sinus endoscopy. mass benign  . ELECTROMAGNETIC NAVIGATION BROCHOSCOPY Right 06/25/2018   Procedure: ELECTROMAGNETIC NAVIGATION BRONCHOSCOPY;  Surgeon: [name], MD;  Location: [facility] ORS;  Service: Cardiopulmonary;  Laterality: Right;  . ENDOBRONCHIAL ULTRASOUND Right 06/25/2018   Procedure: ENDOBRONCHIAL ULTRASOUND;  Surgeon: [name], MD;  Location: [facility] OR[facility];  Service: Cardiopulmonary;  Laterality: Right;  . ENDOBRONCHIAL ULTRASOUND Right 12/26/2018   Procedure: ENDOBRONCHIAL ULTRASOUND RIGHT;  Surgeon: [name], MD;  Location: [facility] OR[facility];  Service: Cardiopulmonary;  Laterality: Right;  . FLEXIBLE BRONCHOSCOPY N/A 08/20/2018   Procedure: FLEXIBLE BRONCHOSCOPY PREOP;  Surgeon: [name], MD;  Location: [facility] ORS;  Service: Thoracic;  Laterality: N/A;  . IR CV LINE INJECTION  04/04/2019  . NASAL SINUS SURGERY  10/2017   At [facility], frontal sinusotomy, ethmoidectomy, resection anterior cranial fossa neoplasm, turbinate resection  . PORTACATH PLACEMENT Left 01/15/2019   Procedure: INSERTION PORT-A-CATH;  Surgeon: [name], MD;  Location: [facility] ORS;  Service: General;  Laterality: Left;  . THORACOTOMY Right 08/13/2018   Procedure: PREOP BROCHOSCOPY WITH RIGHT THORACOTOMY AND RUL RESECTION;  Surgeon: [name], MD;  Location: [facility] OR[facility];  Service: General;  Laterality: Right;  . THORACOTOMY Right 08/20/2018   Procedure: THORACOTOMY MAJOR RIGHT UPPER LOBE LOBECTOMY;  Surgeon: [name], MD;  Location: [facility] OR[facility];  Service: Thoracic;  Laterality: Right;  . TOE SURGERY Left    pin in left toe    SOCIAL HISTORY: Social History   Socioeconomic History  . Marital status: Married    Spouse name: lisa  . Number of children: Not on file  . Years of education: Not on file  . Highest education level: Not on file  Occupational History  . Occupation: welding    Comment: taking time off to resolve issues  Tobacco Use  . Smoking status: Former Smoker    Packs/day: 0.50    Years: 15.00     Pack years: 7.50    Types: Cigarettes    Quit date: 08/2018    Years since quitting: 1.9  . Smokeless tobacco: Never Used  Vaping Use  . Vaping Use: Never used  Substance and Sexual Activity  . Alcohol use: Yes    Alcohol/week: 3.0 standard drinks    Types: 3 Cans of beer per week    Comment: usually 2 drinks per week, per patient  . Drug use: No  . Sexual activity: Not on file  Other Topics Concern  . Not on file  Social History Narrative  . Not on file   Social Determinants of Health   Financial Resource Strain: Not on file  Food Insecurity: Not on file  Transportation Needs: Not on file  Physical Activity: Not on file  Stress: Not on file  Social Connections: Not on file  Intimate Partner Violence: Not on file    FAMILY HISTORY: Family History  Problem Relation Age of Onset  . Breast cancer Mother   . Diabetes Mother   . Lung cancer Father   . Hypertension Father     ALLERGIES:  has No Known Allergies.  MEDICATIONS:  Current Outpatient Medications  Medication Sig Dispense Refill  . albuterol (VENTOLIN HFA) 108 (90 Base) MCG/ACT inhaler TAKE 2 PUFFS BY MOUTH EVERY 6 HOURS AS NEEDED FOR WHEEZE OR SHORTNESS OF BREATH 6.7 each 6  . cloNIDine (CATAPRES) 0.1 MG tablet Take 0.1 mg by mouth daily.   5  . diltiazem (  CARDIZEM CD) 120 MG 24 hr capsule Take 120 mg by mouth daily.    . folic acid (V-R FOLIC ACID) 941 MCG tablet Take 1 tablet (400 mcg total) by mouth daily. 90 tablet 1  . hydrALAZINE (APRESOLINE) 100 MG tablet Take 50 mg by mouth 2 (two) times daily.     . hydrochlorothiazide (HYDRODIURIL) 12.5 MG tablet Take 12.5 mg by mouth daily.     Marland Kitchen ibuprofen (ADVIL) 600 MG tablet Take 600 mg by mouth every 6 (six) hours.    Marland Kitchen KLOR-CON M20 20 MEQ tablet TAKE 1 TABLET BY MOUTH TWICE A DAY 60 tablet 0  . latanoprost (XALATAN) 0.005 % ophthalmic solution 1 drop at bedtime.    . lidocaine-prilocaine (EMLA) cream Apply to affected area once 30 g 3  . magnesium chloride  (SLOW-MAG) 64 MG TBEC SR tablet Take 1 tablet (64 mg total) by mouth 2 (two) times daily. TAKE 1 TABLET (64 MG TOTAL) BY MOUTH DAILY. 60 tablet 1  . methocarbamol (ROBAXIN) 500 MG tablet Take 1 tablet (500 mg total) by mouth at bedtime. 30 tablet 0  . olmesartan (BENICAR) 40 MG tablet Take 40 mg by mouth every other day.     Marland Kitchen SPIRIVA HANDIHALER 18 MCG inhalation capsule INHALE 1 CAPSULE VIA HANDIHALER ONCE DAILY AT THE SAME TIME EVERY DAY 30 capsule 0  . SYMBICORT 80-4.5 MCG/ACT inhaler INHALE 2 PUFFS INTO THE LUNGS IN THE MORNING AND AT BEDTIME. 10.2 each 6  . vitamin B-12 (CYANOCOBALAMIN) 1000 MCG tablet Take 1 tablet (1,000 mcg total) by mouth daily. 90 tablet 1  . amoxicillin (AMOXIL) 875 MG tablet Take 875 mg by mouth 2 (two) times daily. (Patient not taking: Reported on 07/16/2020)    . ondansetron (ZOFRAN) 8 MG tablet Take 1 tablet (8 mg total) by mouth 2 (two) times daily as needed for refractory nausea / vomiting. Start on day 3 after chemo. (Patient not taking: Reported on 07/16/2020) 30 tablet 1  . prochlorperazine (COMPAZINE) 10 MG tablet Take 1 tablet (10 mg total) by mouth every 6 (six) hours as needed (Nausea or vomiting). (Patient not taking: Reported on 07/16/2020) 30 tablet 1   No current facility-administered medications for this visit.   Facility-Administered Medications Ordered in Other Visits  Medication Dose Route Frequency Provider Last Rate Last Admin  . albuterol (PROVENTIL) (2.5 MG/3ML) 0.083% nebulizer solution 2.5 mg  2.5 mg Nebulization Once System, Provider Not In      . heparin lock flush 100 unit/mL  500 Units Intravenous Once Earlie Server, MD      . LORazepam (ATIVAN) injection 0.5 mg  0.5 mg Intravenous Once PRN Earlie Server, MD      . sodium chloride flush (NS) 0.9 % injection 10 mL  10 mL Intravenous PRN Earlie Server, MD   10 mL at 04/04/19 0903     PHYSICAL EXAMINATION: ECOG PERFORMANCE STATUS: 0 - Asymptomatic Vitals:   07/16/20 0845 07/16/20 0919  BP: (!) 201/114 (!)  182/100  Pulse: 99 96  Resp: 16   Temp: (!) 97.5 F (36.4 C)   SpO2: 98%    Filed Weights   07/16/20 0845  Weight: 145 lb 4.8 oz (65.9 kg)    Physical Exam Constitutional:      General: He is not in acute distress. HENT:     Head: Normocephalic and atraumatic.  Eyes:     General: No scleral icterus. Cardiovascular:     Rate and Rhythm: Normal rate and regular rhythm.  Heart sounds: Normal heart sounds.  Pulmonary:     Effort: Pulmonary effort is normal. No respiratory distress.     Breath sounds: No wheezing.  Abdominal:     General: Bowel sounds are normal. There is no distension.     Palpations: Abdomen is soft.  Musculoskeletal:        General: No deformity. Normal range of motion.     Cervical back: Normal range of motion and neck supple.  Skin:    General: Skin is warm and dry.     Findings: No erythema or rash.  Neurological:     Mental Status: He is alert and oriented to person, place, and time. Mental status is at baseline.     Cranial Nerves: No cranial nerve deficit.     Coordination: Coordination normal.  Psychiatric:        Mood and Affect: Mood normal.      LABORATORY DATA:  I have reviewed the data as listed Lab Results  Component Value Date   WBC 11.0 (H) 07/16/2020   HGB 15.3 07/16/2020   HCT 43.1 07/16/2020   MCV 98.4 07/16/2020   PLT 161 07/16/2020   Recent Labs    03/17/20 0830 03/31/20 0825 04/07/20 0854 04/22/20 0825 05/06/20 0806 05/20/20 0818 07/06/20 1204 07/16/20 0815  NA 134* 137 137   < > 140 140  --  137  K 4.6 3.9 3.7   < > 3.5 3.9  --  4.2  CL 101 102 104   < > 105 104  --  103  CO2 20* 23 21*   < > 22 26  --  22  GLUCOSE 167* 126* 163*   < > 98 100*  --  101*  BUN _0 < > 14 13  --  9  CREATININE 1.19 1.10 1.10   < > 0.89 1.05 1.00 1.04  CALCIUM 9.0 9.1 8.8*   < > 8.8* 8.7*  --  9.3  GFRNONAA >60 >60 >60   < > >60 >60  --  >60  GFRAA >60 >60 >60  --   --   --   --   --   PROT 7.7 7.6 7.1   < > 7.5 7.1   --  7.7  ALBUMIN 3.5 3.5 3.4*   < > 3.5 3.3*  --  3.8  AST 32 29 25   < > 38 26  --  20  ALT _1 < > 34 21  --  15  ALKPHOS 86 87 76   < > 110 98  --  93  BILITOT 0.8 0.7 0.5   < > 0.6 0.6  --  1.2   < > = values in this interval not displayed.   Iron/TIBC/Ferritin/ %Sat No results found for: IRON, TIBC, FERRITIN, IRONPCTSAT   RADIOGRAPHIC STUDIES: I have personally reviewed the radiological images as listed and agreed with the findings in the report. CT Chest W Contrast  Result Date: 06/14/2020 CLINICAL DATA:  55 year old male with history of lung cancer. Follow-up study. EXAM: CT CHEST WITH CONTRAST TECHNIQUE: Multidetector CT imaging of the chest was performed during intravenous contrast administration. CONTRAST:  82m OMNIPAQUE IOHEXOL 300 MG/ML  SOLN COMPARISON:  Chest CT 04/21/2020. FINDINGS: Cardiovascular: Heart size is normal. Small amount of pericardial fluid and/or thickening, increased compared to the prior study, but unlikely to be of hemodynamic significance at this time. No associated pericardial calcification. There is aortic atherosclerosis,  as well as atherosclerosis of the great vessels of the mediastinum and the coronary arteries, including calcified atherosclerotic plaque in the left anterior descending coronary artery. Left-sided subclavian single-lumen porta cath with tip terminating in the distal superior vena cava. Mediastinum/Nodes: No pathologically enlarged mediastinal or hilar lymph nodes. Esophagus is unremarkable in appearance. No axillary lymphadenopathy. Lungs/Pleura: Status post right upper lobectomy. Compensatory hyperexpansion of the right middle and lower lobes. Increasing areas of nodular septal thickening throughout the right lung, most evident in the right middle lobe where the largest of these nodules measures up to 11 x 8 mm (axial image 62 of series 3). Several other scattered tiny 2-5 mm pulmonary nodules are also noted in the left lung. Small right  pleural effusion with pleural enhancement Upper Abdomen: Multiple tiny 2-3 mm nonobstructive calculi in the collecting systems of both kidneys. Aortic atherosclerosis. Aortic atherosclerosis. Musculoskeletal: There are no aggressive appearing lytic or blastic lesions noted in the visualized portions of the skeleton. IMPRESSION: 1. Evidence of progressive lymphangitic spread of tumor throughout the right lung, with additional pulmonary nodules in the contralateral lung suspicious for potential hematogenous metastasis. In addition, there is a small right pleural effusion with areas of pleural enhancement, which could indicate a developing malignant pleural effusion. 2. Multiple nonobstructive calculi in the collecting systems of both kidneys. 3. Aortic atherosclerosis, in addition to left anterior descending coronary artery disease. Please note that although the presence of coronary artery calcium documents the presence of coronary artery disease, the severity of this disease and any potential stenosis cannot be assessed on this non-gated CT examination. Assessment for potential risk factor modification, dietary therapy or pharmacologic therapy may be warranted, if clinically indicated. Aortic Atherosclerosis (ICD10-I70.0). Electronically Signed   By: Vinnie Langton M.D.   On: 06/14/2020 10:41   CT Chest W Contrast  Result Date: 04/21/2020 CLINICAL DATA:  Lung cancer follow-up, squamous cell carcinoma diagnosed 2 years ago. EXAM: CT CHEST WITH CONTRAST TECHNIQUE: Multidetector CT imaging of the chest was performed during intravenous contrast administration. CONTRAST:  50m OMNIPAQUE IOHEXOL 300 MG/ML  SOLN COMPARISON:  February 06, 2020 FINDINGS: Cardiovascular: LEFT-sided Port-A-Cath terminates at the midportion to distal portion of superior vena cava. Heart size is normal. Trace pericardial fluid similar to the prior study. Central pulmonary vasculature is distorted at the RIGHT hilum as before otherwise  unremarkable on venous phase assessment. Mediastinum/Nodes: Thoracic inlet structures are normal. No axillary lymphadenopathy. No mediastinal adenopathy. Esophagus grossly normal. Lungs/Pleura: Emphysema. Postoperative changes of RIGHT upper lobectomy. No dense consolidation. Linear pattern of septal thickening extends through the RIGHT mid chest compatible with prior radiotherapy. Scattered nodularity is unchanged. Micro nodules grouped in the anterior LEFT upper lobe with similar appearance. Enlarging peripheral LEFT upper lobe nodules however since the prior study (image 55 of series 3) with a 5 mm nodule that previously was quite indistinct and at best 2 mm. Similar appearance and change in a small 3 mm nodule in the anterior LEFT upper lobe laterally. Scattered millimetric nodules throughout the LEFT chest including a nother small nodule that is approximately 3-4 mm on image 125 of series 3 and at least 4 others that show a similar appearance at the periphery of the LEFT lower lobe on images 112 through 128. No effusion. New area of septal nodularity in the RIGHT lung base on image 110 of series 3 measures 4-5 mm Upper Abdomen: Imaged portions of liver, spleen, pancreas and gastrointestinal tract are unremarkable. The adrenal glands are normal. Nephrolithiasis as before.  Symmetric renal enhancement. Musculoskeletal: No acute bone finding. No destructive bone process. Spinal degenerative changes. IMPRESSION: Increasingly nodular appearance of post treatment changes in the RIGHT chest albeit subtle since the previous study with scattered pulmonary nodules throughout the chest many of these nodules removed from post treatment changes are new since February of 2021 and suspicious for metastatic disease. Aortic atherosclerosis. Aortic Atherosclerosis (ICD10-I70.0). Electronically Signed   By: Zetta Bills M.D.   On: 04/21/2020 17:03   CT Abdomen W Contrast  Result Date: 07/06/2020 CLINICAL DATA:  Right lung  squamous cell carcinoma.  Staging. EXAM: CT ABDOMEN WITH CONTRAST TECHNIQUE: Multidetector CT imaging of the abdomen was performed using the standard protocol following bolus administration of intravenous contrast. CONTRAST:  162m OMNIPAQUE IOHEXOL 300 MG/ML  SOLN COMPARISON:  [facility]2018 [facility] FINDINGS: Lower chest: Tiny right pleural effusion, as seen on recent chest CT. Hepatobiliary: No hepatic masses identified. Gallbladder is unremarkable. No evidence of biliary ductal dilatation. Pancreas:  No mass or inflammatory changes. Spleen:  Within normal limits in size and appearance. Adrenals/Urinary Tract: Normal adrenal glands. Mild right renal parenchymal scarring. Stable tiny cyst in lower pole of left kidney. No masses identified. A few tiny 1-2 mm renal calculi are again seen bilaterally, however there is no evidence of hydronephrosis. Stomach/Bowel: Visualized portion unremarkable. Vascular/Lymphatic: No pathologically enlarged lymph nodes identified. No abdominal aortic aneurysm. Aortic atherosclerotic calcification noted. Other:  None. Musculoskeletal:  No suspicious bone lesions identified. IMPRESSION: No evidence of abdominal metastatic disease or other acute findings. Bilateral nephrolithiasis. No evidence of hydronephrosis. Electronically Signed   By: JMarlaine HindM.D.   On: 07/06/2020 15:49   MR Brain W Wo Contrast  Result Date: 07/01/2020 CLINICAL DATA:  Lung cancer patient.  Yearly follow-up. EXAM: MRI HEAD WITHOUT AND WITH CONTRAST TECHNIQUE: Multiplanar, multiecho pulse sequences of the brain and surrounding structures were obtained without and with intravenous contrast. CONTRAST:  651mGADAVIST GADOBUTROL 1 MMOL/ML IV SOLN COMPARISON:  01/20/2019 FINDINGS: Brain: Mild generalized volume loss. Diffusion imaging does not show any acute or subacute infarction or other cause of restricted diffusion. Mild chronic small-vessel change affects the pons. No focal  cerebellar finding. Cerebral hemispheres show mild to moderate chronic small-vessel change of the white matter, advanced for age. No cortical or large vessel territory infarction. No mass lesion, hemorrhage, hydrocephalus or extra-axial collection. After contrast administration, no abnormal enhancement occurs. Vascular: Major vessels at the base of the brain show flow. Skull and upper cervical spine: Negative Sinuses/Orbits: Clear sinuses presently. Previous functional endoscopic sinus surgery. No acute orbital finding. Dysconjugate gaze. Other: None IMPRESSION: 1. No evidence of metastatic disease. 2. Brain atrophy and chronic small-vessel change of the pons and cerebral hemispheric white matter, advanced for age. Electronically Signed   By: MaNelson Chimes.D.   On: 07/01/2020 08:55     ASSESSMENT & PLAN:  1. Encounter for antineoplastic immunotherapy   [place]. [place]  3. Hypomagnesemia   4. Hypokalemia   5. Encounter for antineoplastic chemotherapy    #Recurrent Squamous cell carcinoma of lung, stage IV Labs reviewed and discussed with patient. Proceed with carboplatin Keytruda today.  Not enough chair time to complete Taxol today, patient will receive Taxol treatment on 07/21/2019. #Chronic hypomagnesia.  Continue slow magnesium twice daily. #Chronic hypokalemia,  potassium supplementation 20 mEq today. Stable K.   .  Return of visit: 1 week after treatment to access tolerability. 3 weeks for evaluation of next  cycle of treatment  Earlie Server, MD, PhD Hematology Oncology [facility] 07/16/2020

## 2020-07-16 NOTE — Progress Notes (Signed)
ON PATHWAY REGIMEN - Non-Small Cell Lung  No Change  Continue With Treatment as Ordered.  Original Decision Date/Time: 07/14/2020 15:20     A cycle is every 21 days:     Pembrolizumab      Paclitaxel      Carboplatin   **Always confirm dose/schedule in your pharmacy ordering system**  Patient Characteristics: Stage IV Metastatic, Squamous, PS = 0, 1, First Line, PD-L1 Expression Positive 1-49% (TPS) / Negative / Not Tested / Awaiting Test Results and Immunotherapy Candidate Therapeutic Status: Stage IV Metastatic Histology: Squamous Cell Line of therapy: First Line ECOG Performance Status: 0 PD-L1 Expression Status: PD-L1 Positive 1-49% (TPS) Immunotherapy Candidate Status: Candidate for Immunotherapy Intent of Therapy: Non-Curative / Palliative Intent, Discussed with Patient

## 2020-07-16 NOTE — Progress Notes (Signed)
Pt receiving carb/keytruday today and taxol on 1/10. WIll keep premeds today as patient has received carboplatin previously and this will be his 9th dose.

## 2020-07-16 NOTE — Progress Notes (Signed)
0940: Per Benjamine Mola RN per Dr. Tasia Catchings, Pt to receive Ativan **see MAR**, then recheck B/P for MD to determine treatment.  1011: B/P 171/105, Per Dr. Tasia Catchings okay to proceed with treatment.   Pt will receive Keytruda and Carboplatin only today and Taxol on 07/21/19.   1350: Pt tolerated infusion well, no s/s of distress or reaction noted, Pt and VS stable at discharge.

## 2020-07-20 ENCOUNTER — Inpatient Hospital Stay: Payer: 59

## 2020-07-20 VITALS — BP 132/80 | HR 92 | Temp 97.6°F | Resp 20 | Wt 145.0 lb

## 2020-07-20 DIAGNOSIS — Z95828 Presence of other vascular implants and grafts: Secondary | ICD-10-CM

## 2020-07-20 DIAGNOSIS — Z5112 Encounter for antineoplastic immunotherapy: Secondary | ICD-10-CM | POA: Diagnosis not present

## 2020-07-20 DIAGNOSIS — C3491 Malignant neoplasm of unspecified part of right bronchus or lung: Secondary | ICD-10-CM

## 2020-07-20 MED ORDER — SODIUM CHLORIDE 0.9% FLUSH
10.0000 mL | Freq: Once | INTRAVENOUS | Status: AC
  Administered 2020-07-20: 10 mL via INTRAVENOUS
  Filled 2020-07-20: qty 10

## 2020-07-20 MED ORDER — SODIUM CHLORIDE 0.9 % IV SOLN
200.0000 mg/m2 | Freq: Once | INTRAVENOUS | Status: AC
  Administered 2020-07-20: 360 mg via INTRAVENOUS
  Filled 2020-07-20: qty 60

## 2020-07-20 MED ORDER — SODIUM CHLORIDE 0.9 % IV SOLN
10.0000 mg | Freq: Once | INTRAVENOUS | Status: AC
  Administered 2020-07-20: 10 mg via INTRAVENOUS
  Filled 2020-07-20: qty 10

## 2020-07-20 MED ORDER — DIPHENHYDRAMINE HCL 50 MG/ML IJ SOLN
50.0000 mg | Freq: Once | INTRAMUSCULAR | Status: AC
  Administered 2020-07-20: 50 mg via INTRAVENOUS
  Filled 2020-07-20: qty 1

## 2020-07-20 MED ORDER — SODIUM CHLORIDE 0.9 % IV SOLN
INTRAVENOUS | Status: DC
  Filled 2020-07-20: qty 250

## 2020-07-20 MED ORDER — HEPARIN SOD (PORK) LOCK FLUSH 100 UNIT/ML IV SOLN
500.0000 [IU] | Freq: Once | INTRAVENOUS | Status: AC
  Administered 2020-07-20: 500 [IU] via INTRAVENOUS
  Filled 2020-07-20: qty 5

## 2020-07-20 MED ORDER — FAMOTIDINE IN NACL 20-0.9 MG/50ML-% IV SOLN
20.0000 mg | Freq: Once | INTRAVENOUS | Status: AC
  Administered 2020-07-20: 20 mg via INTRAVENOUS
  Filled 2020-07-20: qty 50

## 2020-07-20 NOTE — Progress Notes (Signed)
Tolerated 1st infusion of Taxol well. Stable at discharge

## 2020-07-26 ENCOUNTER — Inpatient Hospital Stay: Payer: 59

## 2020-07-26 ENCOUNTER — Other Ambulatory Visit: Payer: Self-pay

## 2020-07-26 ENCOUNTER — Inpatient Hospital Stay (HOSPITAL_BASED_OUTPATIENT_CLINIC_OR_DEPARTMENT_OTHER): Payer: 59 | Admitting: Oncology

## 2020-07-26 ENCOUNTER — Encounter: Payer: Self-pay | Admitting: Oncology

## 2020-07-26 VITALS — BP 88/61 | HR 103 | Temp 98.2°F | Resp 18

## 2020-07-26 VITALS — BP 110/66 | HR 85 | Temp 97.6°F | Resp 17

## 2020-07-26 DIAGNOSIS — E860 Dehydration: Secondary | ICD-10-CM

## 2020-07-26 DIAGNOSIS — C3491 Malignant neoplasm of unspecified part of right bronchus or lung: Secondary | ICD-10-CM

## 2020-07-26 DIAGNOSIS — E876 Hypokalemia: Secondary | ICD-10-CM

## 2020-07-26 DIAGNOSIS — D696 Thrombocytopenia, unspecified: Secondary | ICD-10-CM

## 2020-07-26 DIAGNOSIS — R059 Cough, unspecified: Secondary | ICD-10-CM

## 2020-07-26 DIAGNOSIS — E861 Hypovolemia: Secondary | ICD-10-CM

## 2020-07-26 DIAGNOSIS — I9589 Other hypotension: Secondary | ICD-10-CM | POA: Diagnosis not present

## 2020-07-26 DIAGNOSIS — Z5112 Encounter for antineoplastic immunotherapy: Secondary | ICD-10-CM | POA: Diagnosis not present

## 2020-07-26 LAB — COMPREHENSIVE METABOLIC PANEL
ALT: 16 U/L (ref 0–44)
AST: 24 U/L (ref 15–41)
Albumin: 3.6 g/dL (ref 3.5–5.0)
Alkaline Phosphatase: 71 U/L (ref 38–126)
Anion gap: 13 (ref 5–15)
BUN: 21 mg/dL — ABNORMAL HIGH (ref 6–20)
CO2: 24 mmol/L (ref 22–32)
Calcium: 8.7 mg/dL — ABNORMAL LOW (ref 8.9–10.3)
Chloride: 96 mmol/L — ABNORMAL LOW (ref 98–111)
Creatinine, Ser: 1.06 mg/dL (ref 0.61–1.24)
GFR, Estimated: >60 mL/min (ref >60)
Glucose, Bld: 94 mg/dL (ref 70–99)
Potassium: 3.6 mmol/L (ref 3.5–5.1)
Sodium: 133 mmol/L — ABNORMAL LOW (ref 135–145)
Total Bilirubin: 0.6 mg/dL (ref 0.3–1.2)
Total Protein: 7.1 g/dL (ref 6.5–8.1)

## 2020-07-26 LAB — CBC WITH DIFFERENTIAL/PLATELET
Abs Immature Granulocytes: 0.03 10*3/uL (ref 0.00–0.07)
Basophils Absolute: 0 10*3/uL (ref 0.0–0.1)
Basophils Relative: 1 %
Eosinophils Absolute: 0.2 10*3/uL (ref 0.0–0.5)
Eosinophils Relative: 4 %
HCT: 38.2 % — ABNORMAL LOW (ref 39.0–52.0)
Hemoglobin: 13.9 g/dL (ref 13.0–17.0)
Immature Granulocytes: 1 %
Lymphocytes Relative: 15 %
Lymphs Abs: 0.8 10*3/uL (ref 0.7–4.0)
MCH: 35.2 pg — ABNORMAL HIGH (ref 26.0–34.0)
MCHC: 36.4 g/dL — ABNORMAL HIGH (ref 30.0–36.0)
MCV: 96.7 fL (ref 80.0–100.0)
Monocytes Absolute: 0.2 10*3/uL (ref 0.1–1.0)
Monocytes Relative: 4 %
Neutro Abs: 4.2 10*3/uL (ref 1.7–7.7)
Neutrophils Relative %: 75 %
Platelets: 101 10*3/uL — ABNORMAL LOW (ref 150–400)
RBC: 3.95 MIL/uL — ABNORMAL LOW (ref 4.22–5.81)
RDW: 12.6 % (ref 11.5–15.5)
WBC: 5.4 10*3/uL (ref 4.0–10.5)
nRBC: 0 % (ref 0.0–0.2)

## 2020-07-26 MED ORDER — SODIUM CHLORIDE 0.9 % IV SOLN
Freq: Once | INTRAVENOUS | Status: AC
  Filled 2020-07-26: qty 250

## 2020-07-26 MED ORDER — HEPARIN SOD (PORK) LOCK FLUSH 100 UNIT/ML IV SOLN
500.0000 [IU] | Freq: Once | INTRAVENOUS | Status: AC
  Administered 2020-07-26: 500 [IU] via INTRAVENOUS
  Filled 2020-07-26: qty 5

## 2020-07-26 MED ORDER — SODIUM CHLORIDE 0.9% FLUSH
10.0000 mL | INTRAVENOUS | Status: AC | PRN
  Administered 2020-07-26: 10 mL via INTRAVENOUS
  Filled 2020-07-26: qty 10

## 2020-07-26 NOTE — Progress Notes (Signed)
Pt here for follow up. Pt reports new onset of hiccups that cuts of breath sometimes. Pt reports he has been eating and drinking well.

## 2020-07-26 NOTE — Progress Notes (Signed)
Pt received IV hydration today. VSS at time of discharge. Ambulatory.

## 2020-07-26 NOTE — Progress Notes (Signed)
Hematology/Oncology Follow up note [facility] [phone] [phone]   Patient Care Team: Samuel Harrier, MD as PCP - General (Internal Medicine) Samuel Server, MD as Consulting Physician (Hematology and Oncology)  REASON FOR VISIT:  Follow up for treatment of squamous lung cancer.   HISTORY OF PRESENTING ILLNESS:  # Dec 2019 Stage I squamous lung cancer #s/p Bronchoscopy on 06/25/2018. subcarina EBUS FNA was non diagnostic, hypocellular specimen.  # 08/13/2018 CT guided right upper lobe biopsy pathology showed dense fibrosis and mixed inflammatory cells with prominent polytypic plasma cells component. Focal benign bronchial wall and alveolar spaces. No malignancy was identified.   # 08/13/2018 underwent right thoracotomy and wedge resection of a right upper lobe mass.  Frozen section was consistent with an inflammatory process.  On the second postop day, preliminary pathology reports high-grade malignancy.  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  08/20/2018 Patient was therefore taken back to the OR and underwent take complete lobectomy  pT1b pN0 cM0 stage I squamous cell lung cancer.  Margin is negative.  Recommend observation.    # 12/03/2018 CT chest w contrast showed local recurrence.  12/10/2018 PET showed No evidence of distant metastatic disease # 12/26/2018 s/p bronchoscopy biopsy. Confirmed local recurrence of squamous cell lung cancer.  medi port placed by Dr.Oaks.  # 06/14/2020 CT chest w contrast was reviewed and discussed with patient.  Evidence of progressive lymphangitic spread of tumor throughout the right lung, with contralateral lung nodules with  right pleural effusion  MRI brain is negative for CNS involvement.  CT abdomen with contrast showed no evidence of abdominal metastatic disease PET scan was not approved by insurance-peer to peer appeal with Dr.Vipul Choi   # NGS - KRAS V14L,  TMB 26.6-high, MS stable, PD-L1  5% CDKN2C G48, DICER1c.2117-1G>T, FLT4 G1131S, NF1 A440f, NF1 L2643*, STK11 N1862fCancer treatment Started concurrent chemoradiation on 01/16/2019 Carboplatin AUC of 2 and Taxol 45 mg/m2 weekly finished in 03/05/2019 04/10/2019, patient started on durvalumab maintenance.  INTERVAL HISTORY [name] who has above history reviewed by me today presents for reatment of  recurrent  squamous cell lung cancer. He tolerates chemotherapy. Mild nausea and he takes antiemetics with improvement. No vomiting, diarrhea, fever or chills.  BP today is low. He takes multiple BP medications. Denies any lightheadedness, dizziness.     Review of Systems  Constitutional: Positive for fatigue. Negative for appetite change, chills, fever and unexpected weight change.  HENT:   Negative for hearing loss and voice change.   Eyes: Negative for eye problems and icterus.  Respiratory: Negative for chest tightness, cough and shortness of breath.   Cardiovascular: Negative for chest pain and leg swelling.  Gastrointestinal: Positive for nausea. Negative for abdominal distention and abdominal pain.  Endocrine: Negative for hot flashes.  Genitourinary: Negative for difficulty urinating, dysuria and frequency.   Musculoskeletal: Negative for arthralgias.  Skin: Negative for itching and rash.  Neurological: Negative for light-headedness and numbness.  Hematological: Negative for adenopathy. Does not bruise/bleed easily.  Psychiatric/Behavioral: Negative for confusion.    MEDICAL HISTORY:  Past Medical History:  Diagnosis Date  . Alcohol abuse    usually drinks 2-3 drinks per day  . Atherosclerosis 06/2018  . Chronic sinusitis   . Dehydration 02/07/2019  . Emphysema of lung (HCLehr12/2019   patient unaware of this.  . Hip fracture (HCBritt11/2019   no surgery  . History of kidney stones 05/2018   per  xray, bilateral nephrolitiasis  . Hypertension   . Squamous cell carcinoma of lung, right  ([place]) 06/2018    SURGICAL HISTORY: Past Surgical History:  Procedure Laterality Date  . BRAIN SURGERY  10/2017   nasal/sinus endoscopy. mass benign  . ELECTROMAGNETIC NAVIGATION BROCHOSCOPY Right 06/25/2018   Procedure: ELECTROMAGNETIC NAVIGATION BRONCHOSCOPY;  Surgeon: Samuel Lipps, MD;  Location: [facility];  Service: Cardiopulmonary;  Laterality: Right;  . ENDOBRONCHIAL ULTRASOUND Right 06/25/2018   Procedure: ENDOBRONCHIAL ULTRASOUND;  Surgeon: Samuel Lipps, MD;  Location: [facility];  Service: Cardiopulmonary;  Laterality: Right;  . ENDOBRONCHIAL ULTRASOUND Right 12/26/2018   Procedure: ENDOBRONCHIAL ULTRASOUND RIGHT;  Surgeon: Samuel Lipps, MD;  Location: [facility];  Service: Cardiopulmonary;  Laterality: Right;  . FLEXIBLE BRONCHOSCOPY N/A 08/20/2018   Procedure: FLEXIBLE BRONCHOSCOPY PREOP;  Surgeon: Samuel Lewandowsky, MD;  Location: [facility];  Service: Thoracic;  Laterality: N/A;  . IR CV LINE INJECTION  04/04/2019  . NASAL SINUS SURGERY  10/2017   At [facility], frontal sinusotomy, ethmoidectomy, resection anterior cranial fossa neoplasm, turbinate resection  . PORTACATH PLACEMENT Left 01/15/2019   Procedure: INSERTION PORT-A-CATH;  Surgeon: Samuel Lewandowsky, MD;  Location: [facility];  Service: General;  Laterality: Left;  . THORACOTOMY Right 08/13/2018   Procedure: PREOP BROCHOSCOPY WITH RIGHT THORACOTOMY AND RUL RESECTION;  Surgeon: Samuel Lewandowsky, MD;  Location: [facility];  Service: General;  Laterality: Right;  . THORACOTOMY Right 08/20/2018   Procedure: THORACOTOMY MAJOR RIGHT UPPER LOBE LOBECTOMY;  Surgeon: Samuel Lewandowsky, MD;  Location: [facility];  Service: Thoracic;  Laterality: Right;  . TOE SURGERY Left    pin in left toe    SOCIAL HISTORY: Social History   Socioeconomic History  . Marital status: Married    Spouse name: Samuel Choi  . Number of children: Not on file  . Years of education: Not on file  . Highest education level: Not on file  Occupational History  . Occupation: welding     Comment: taking time off to resolve issues  Tobacco Use  . Smoking status: Former Smoker    Packs/day: 0.50    Years: 15.00    Pack years: 7.50    Types: Cigarettes    Quit date: 08/2018    Years since quitting: 1.9  . Smokeless tobacco: Never Used  Vaping Use  . Vaping Use: Never used  Substance and Sexual Activity  . Alcohol use: Yes    Alcohol/week: 3.0 standard drinks    Types: 3 Cans of beer per week    Comment: usually 2 drinks per week, per patient  . Drug use: No  . Sexual activity: Not on file  Other Topics Concern  . Not on file  Social History Narrative  . Not on file   Social Determinants of Health   Financial Resource Strain: Not on file  Food Insecurity: Not on file  Transportation Needs: Not on file  Physical Activity: Not on file  Stress: Not on file  Social Connections: Not on file  Intimate Partner Violence: Not on file    FAMILY HISTORY: Family History  Problem Relation Age of Onset  . Breast cancer Mother   . Diabetes Mother   . Lung cancer Father   . Hypertension Father     ALLERGIES:  has No Known Allergies.  MEDICATIONS:  Current Outpatient Medications  Medication Sig Dispense Refill  . albuterol (VENTOLIN HFA) 108 (90 Base) MCG/ACT inhaler TAKE 2 PUFFS BY MOUTH EVERY 6 HOURS AS NEEDED FOR WHEEZE OR SHORTNESS OF BREATH 6.7 each  6  . cloNIDine (CATAPRES) 0.1 MG tablet Take 0.1 mg by mouth daily.   5  . diltiazem (CARDIZEM CD) 120 MG 24 hr capsule Take 120 mg by mouth daily.    . folic acid (V-R FOLIC ACID) 557 MCG tablet Take 1 tablet (400 mcg total) by mouth daily. 90 tablet 1  . hydrALAZINE (APRESOLINE) 100 MG tablet Take 50 mg by mouth 2 (two) times daily.     . hydrochlorothiazide (HYDRODIURIL) 12.5 MG tablet Take 12.5 mg by mouth daily.     Marland Kitchen ibuprofen (ADVIL) 600 MG tablet Take 600 mg by mouth every 6 (six) hours.    Marland Kitchen KLOR-CON M20 20 MEQ tablet TAKE 1 TABLET BY MOUTH TWICE A DAY 60 tablet 0  . latanoprost (XALATAN) 0.005 %  ophthalmic solution 1 drop at bedtime.    . lidocaine-prilocaine (EMLA) cream Apply to affected area once 30 g 3  . magnesium chloride (SLOW-MAG) 64 MG TBEC SR tablet Take 1 tablet (64 mg total) by mouth 2 (two) times daily. TAKE 1 TABLET (64 MG TOTAL) BY MOUTH DAILY. 60 tablet 1  . methocarbamol (ROBAXIN) 500 MG tablet Take 1 tablet (500 mg total) by mouth at bedtime. 30 tablet 0  . olmesartan (BENICAR) 40 MG tablet Take 40 mg by mouth every other day.     . ondansetron (ZOFRAN) 8 MG tablet Take 1 tablet (8 mg total) by mouth 2 (two) times daily as needed for refractory nausea / vomiting. Start on day 3 after chemo. 30 tablet 1  . prochlorperazine (COMPAZINE) 10 MG tablet Take 1 tablet (10 mg total) by mouth every 6 (six) hours as needed (Nausea or vomiting). 30 tablet 1  . SPIRIVA HANDIHALER 18 MCG inhalation capsule INHALE 1 CAPSULE VIA HANDIHALER ONCE DAILY AT THE SAME TIME EVERY DAY 30 capsule 0  . SYMBICORT 80-4.5 MCG/ACT inhaler INHALE 2 PUFFS INTO THE LUNGS IN THE MORNING AND AT BEDTIME. 10.2 each 6  . vitamin B-12 (CYANOCOBALAMIN) 1000 MCG tablet Take 1 tablet (1,000 mcg total) by mouth daily. 90 tablet 1  . amoxicillin (AMOXIL) 875 MG tablet Take 875 mg by mouth 2 (two) times daily. (Patient not taking: No sig reported)     No current facility-administered medications for this visit.   Facility-Administered Medications Ordered in Other Visits  Medication Dose Route Frequency Provider Last Rate Last Admin  . albuterol (PROVENTIL) (2.5 MG/3ML) 0.083% nebulizer solution 2.5 mg  2.5 mg Nebulization Once System, Provider Not In      . heparin lock flush 100 unit/mL  500 Units Intravenous Once Samuel Server, MD      . sodium chloride flush (NS) 0.9 % injection 10 mL  10 mL Intravenous PRN Samuel Server, MD   10 mL at 04/04/19 0903  . sodium chloride flush (NS) 0.9 % injection 10 mL  10 mL Intravenous PRN Samuel Server, MD   10 mL at 07/26/20 1308     PHYSICAL EXAMINATION: ECOG PERFORMANCE STATUS: 1 -  Symptomatic but completely ambulatory Vitals:   07/26/20 1317  BP: (!) 88/61  Pulse: (!) 103  Resp: 18  Temp: 98.2 F (36.8 C)  SpO2: 96%   There were no vitals filed for this visit.  Physical Exam Constitutional:      General: He is not in acute distress. HENT:     Head: Normocephalic and atraumatic.  Eyes:     General: No scleral icterus. Cardiovascular:     Rate and Rhythm: Regular rhythm. Tachycardia present.  Heart sounds: Normal heart sounds.  Pulmonary:     Effort: Pulmonary effort is normal. No respiratory distress.     Breath sounds: No wheezing.  Abdominal:     General: Bowel sounds are normal. There is no distension.     Palpations: Abdomen is soft.  Musculoskeletal:        General: No deformity. Normal range of motion.     Cervical back: Normal range of motion and neck supple.  Skin:    General: Skin is warm and dry.     Findings: No erythema or rash.  Neurological:     Mental Status: He is alert and oriented to person, place, and time. Mental status is at baseline.     Cranial Nerves: No cranial nerve deficit.     Coordination: Coordination normal.  Psychiatric:        Mood and Affect: Mood normal.      LABORATORY DATA:  I have reviewed the data as listed Lab Results  Component Value Date   WBC 5.4 07/26/2020   HGB 13.9 07/26/2020   HCT 38.2 (L) 07/26/2020   MCV 96.7 07/26/2020   PLT 101 (L) 07/26/2020   Recent Labs    03/17/20 0830 03/31/20 0825 04/07/20 0854 04/22/20 0825 05/20/20 0818 07/06/20 1204 07/16/20 0815 07/26/20 1308  NA 134* 137 137   < > 140  --  137 133*  K 4.6 3.9 3.7   < > 3.9  --  4.2 3.6  CL 101 102 104   < > 104  --  103 96*  CO2 20* 23 21*   < > 26  --  22 24  GLUCOSE 167* 126* 163*   < > 100*  --  101* 94  BUN _0 < > 13  --  9 21*  CREATININE 1.19 1.10 1.10   < > 1.05 1.00 1.04 1.06  CALCIUM 9.0 9.1 8.8*   < > 8.7*  --  9.3 8.7*  GFRNONAA >60 >60 >60   < > >60  --  >60 >60  GFRAA >60 >60 >60  --    --   --   --   --   PROT 7.7 7.6 7.1   < > 7.1  --  7.7 7.1  ALBUMIN 3.5 3.5 3.4*   < > 3.3*  --  3.8 3.6  AST 32 29 25   < > 26  --  20 24  ALT _1 < > 21  --  15 16  ALKPHOS 86 87 76   < > 98  --  93 71  BILITOT 0.8 0.7 0.5   < > 0.6  --  1.2 0.6   < > = values in this interval not displayed.   Iron/TIBC/Ferritin/ %Sat No results found for: IRON, TIBC, FERRITIN, IRONPCTSAT   RADIOGRAPHIC STUDIES: I have personally reviewed the radiological images as listed and agreed with the findings in the report. CT Chest W Contrast  Result Date: 06/14/2020 CLINICAL DATA:  55 year old male with history of lung cancer. Follow-up study. EXAM: CT CHEST WITH CONTRAST TECHNIQUE: Multidetector CT imaging of the chest was performed during intravenous contrast administration. CONTRAST:  61m OMNIPAQUE IOHEXOL 300 MG/ML  SOLN COMPARISON:  Chest CT 04/21/2020. FINDINGS: Cardiovascular: Heart size is normal. Small amount of pericardial fluid and/or thickening, increased compared to the prior study, but unlikely to be of hemodynamic significance at this time. No associated pericardial calcification. There is aortic  atherosclerosis, as well as atherosclerosis of the great vessels of the mediastinum and the coronary arteries, including calcified atherosclerotic plaque in the left anterior descending coronary artery. Left-sided subclavian single-lumen porta cath with tip terminating in the distal superior vena cava. Mediastinum/Nodes: No pathologically enlarged mediastinal or hilar lymph nodes. Esophagus is unremarkable in appearance. No axillary lymphadenopathy. Lungs/Pleura: Status post right upper lobectomy. Compensatory hyperexpansion of the right middle and lower lobes. Increasing areas of nodular septal thickening throughout the right lung, most evident in the right middle lobe where the largest of these nodules measures up to 11 x 8 mm (axial image 62 of series 3). Several other scattered tiny 2-5 mm pulmonary  nodules are also noted in the left lung. Small right pleural effusion with pleural enhancement Upper Abdomen: Multiple tiny 2-3 mm nonobstructive calculi in the collecting systems of both kidneys. Aortic atherosclerosis. Aortic atherosclerosis. Musculoskeletal: There are no aggressive appearing lytic or blastic lesions noted in the visualized portions of the skeleton. IMPRESSION: 1. Evidence of progressive lymphangitic spread of tumor throughout the right lung, with additional pulmonary nodules in the contralateral lung suspicious for potential hematogenous metastasis. In addition, there is a small right pleural effusion with areas of pleural enhancement, which could indicate a developing malignant pleural effusion. 2. Multiple nonobstructive calculi in the collecting systems of both kidneys. 3. Aortic atherosclerosis, in addition to left anterior descending coronary artery disease. Please note that although the presence of coronary artery calcium documents the presence of coronary artery disease, the severity of this disease and any potential stenosis cannot be assessed on this non-gated CT examination. Assessment for potential risk factor modification, dietary therapy or pharmacologic therapy may be warranted, if clinically indicated. Aortic Atherosclerosis (ICD10-I70.0). Electronically Signed   By: Vinnie Langton M.D.   On: 06/14/2020 10:41   CT Abdomen W Contrast  Result Date: 07/06/2020 CLINICAL DATA:  Right lung squamous cell carcinoma.  Staging. EXAM: CT ABDOMEN WITH CONTRAST TECHNIQUE: Multidetector CT imaging of the abdomen was performed using the standard protocol following bolus administration of intravenous contrast. CONTRAST:  189m OMNIPAQUE IOHEXOL 300 MG/ML  SOLN [facility]  [facility] FINDINGS: Lower chest: Tiny right pleural effusion, as seen on recent chest CT. Hepatobiliary: No hepatic masses identified. Gallbladder is unremarkable. No evidence of  biliary ductal dilatation. Pancreas:  No mass or inflammatory changes. Spleen:  Within normal limits in size and appearance. Adrenals/Urinary Tract: Normal adrenal glands. Mild right renal parenchymal scarring. Stable tiny cyst in lower pole of left kidney. No masses identified. A few tiny 1-2 mm renal calculi are again seen bilaterally, however there is no evidence of hydronephrosis. Stomach/Bowel: Visualized portion unremarkable. Vascular/Lymphatic: No pathologically enlarged lymph nodes identified. No abdominal aortic aneurysm. Aortic atherosclerotic calcification noted. Other:  None. Musculoskeletal:  No suspicious bone lesions identified. IMPRESSION: No evidence of abdominal metastatic disease or other acute findings. Bilateral nephrolithiasis. No evidence of hydronephrosis. Electronically Signed   By: JMarlaine HindM.D.   On: 07/06/2020 15:49   MR Brain W Wo Contrast  Result Date: 07/01/2020 CLINICAL DATA:  Lung cancer patient.  Yearly follow-up. EXAM: MRI HEAD WITHOUT AND WITH CONTRAST TECHNIQUE: Multiplanar, multiecho pulse sequences of the brain and surrounding structures were obtained without and with intravenous contrast. CONTRAST:  625mGADAVIST GADOBUTROL 1 MMOL/ML IV SOLN COMPARISON:  01/20/2019 FINDINGS: Brain: Mild generalized volume loss. Diffusion imaging does not show any acute or subacute infarction or other cause of restricted diffusion. Mild chronic small-vessel change affects the pons. No  focal cerebellar finding. Cerebral hemispheres show mild to moderate chronic small-vessel change of the white matter, advanced for age. No cortical or large vessel territory infarction. No mass lesion, hemorrhage, hydrocephalus or extra-axial collection. After contrast administration, no abnormal enhancement occurs. Vascular: Major vessels at the base of the brain show flow. Skull and upper cervical spine: Negative Sinuses/Orbits: Clear sinuses presently. Previous functional endoscopic sinus surgery. No  acute orbital finding. Dysconjugate gaze. Other: None IMPRESSION: 1. No evidence of metastatic disease. 2. Brain atrophy and chronic small-vessel change of the pons and cerebral hemispheric white matter, advanced for age. Electronically Signed   By: Nelson Chimes M.D.   On: 07/01/2020 08:55     ASSESSMENT & PLAN:  1. Squamous cell carcinoma of lung, right (HCC)   2. Dehydration   3. Hypotension due to hypovolemia   4. Hypomagnesemia   5. Hypokalemia   6. Thrombocytopenia ([place])    #Recurrent Squamous cell carcinoma of lung, stage IV S/p cycle 1 Carboplatin Taxol and Keytruda.   # Hypotension, BP is low with slight tachycardia, likely dehydration.  IV NS x 1 today. Encourage oral hydration.  Advise him to omit today's evening dose of Hydralazine and measure BP before he takes BP meds tomorrow.  #Chronic hypomagnesia. Continue  slow magnesium twice daily. #Chronic hypokalemia,  potassium supplementation 20 mEq daily. Continue. K is stable.  # Thrombocytopenia, due to chemo. Monitor.  Return of visit: 1 week Lab +/- IVF. 2 weeks for evaluation of next cycle of treatment  Samuel Server, MD, PhD Hematology [place] 07/26/2020

## 2020-07-28 IMAGING — XA IR CENTRAL VENOUS CATHETER
1 series · 6 of 6 positions shown · IV contrast (IODINE)
Comparison: none

INDICATION: 53-year-old with lung cancer and problem with Port-A-Cath.
Port-A-Cath will not aspirate.

[Series 1: vasc extremity · 6 of 6 slices shown]
[im 1/6]
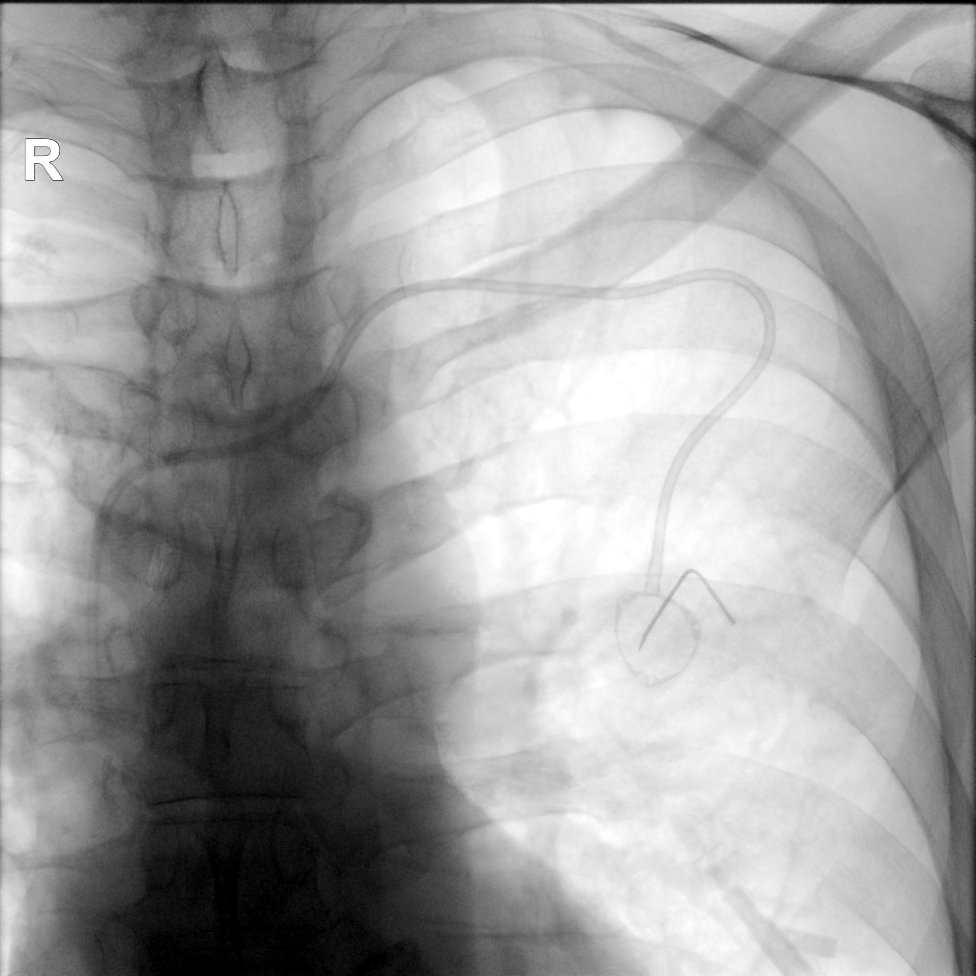
[im 2/6]
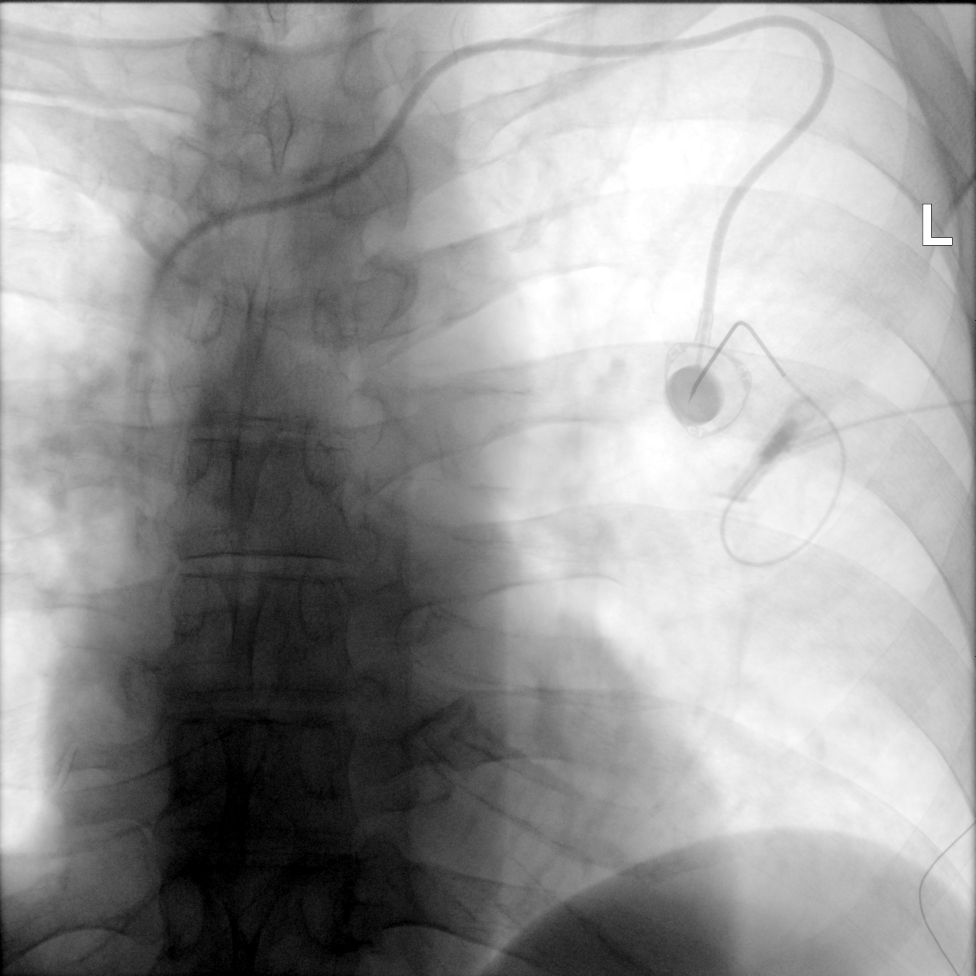
[im 3/6]
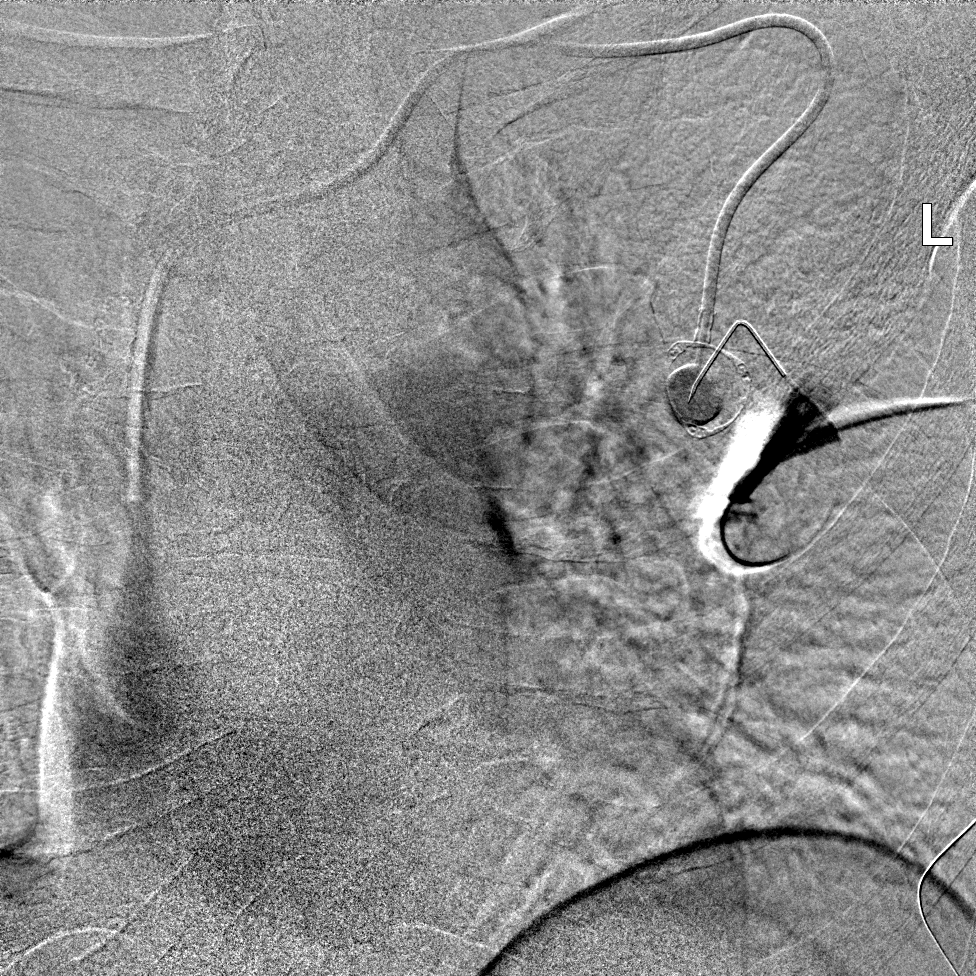
[im 4/6]
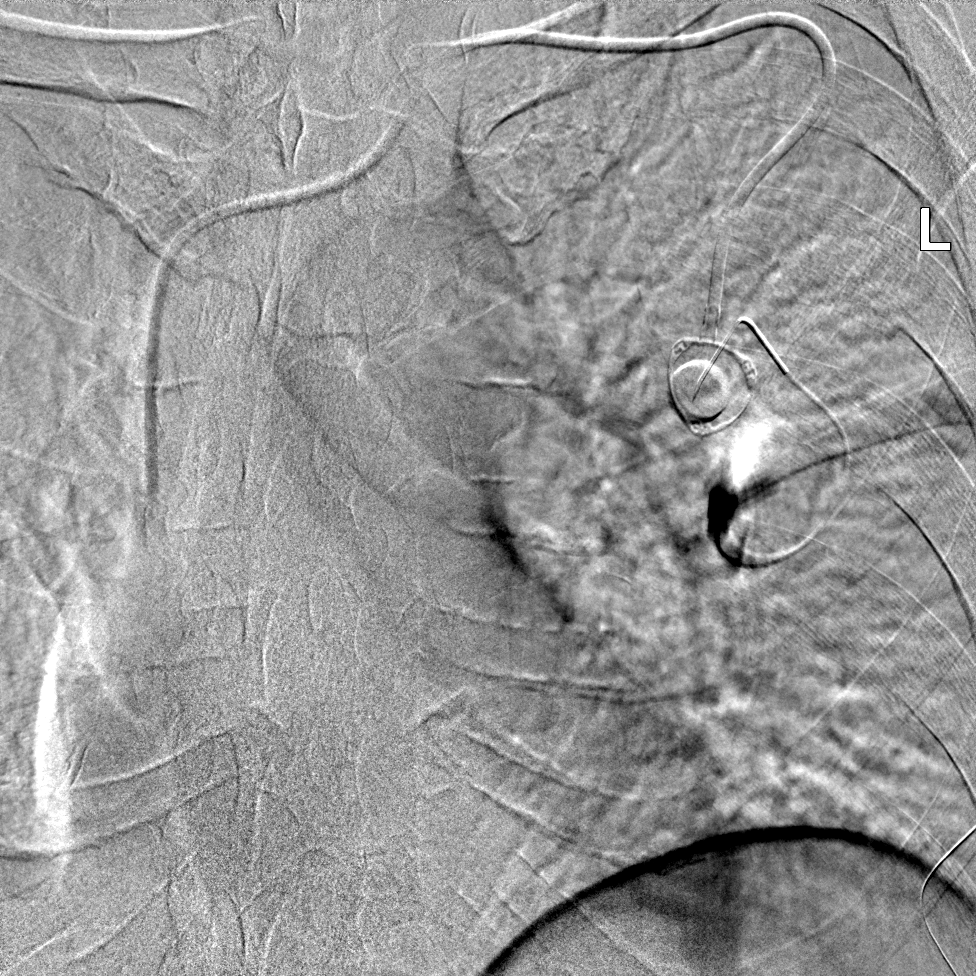
[im 5/6]
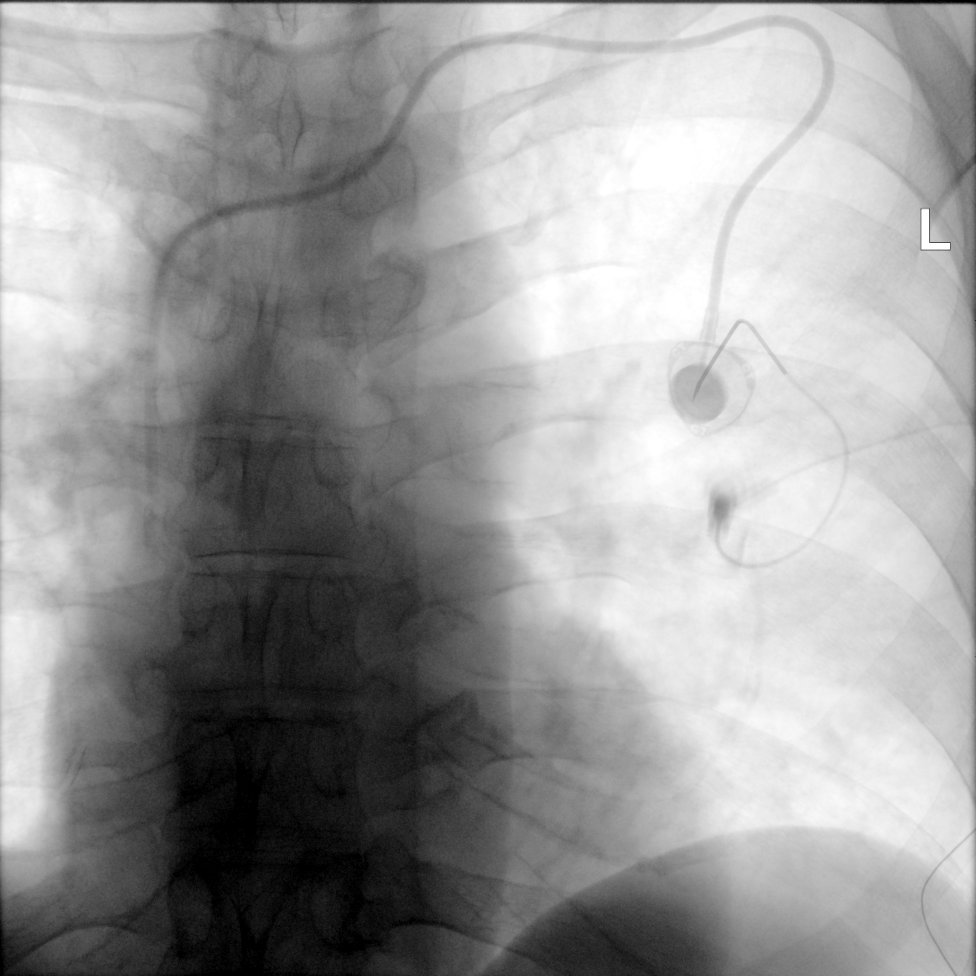
[im 6/6]
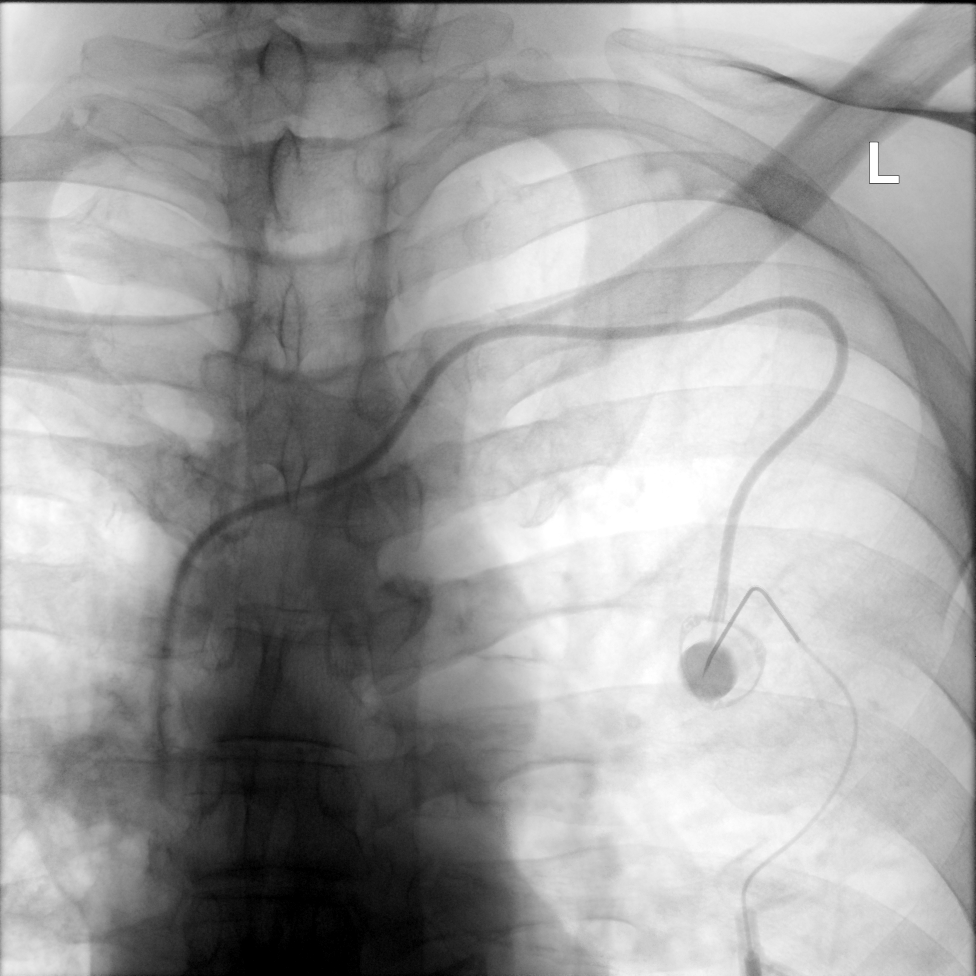

[6 of 6 positions shown; findings below may reference images not displayed]

EXAM:
FLUOROSCOPIC AND ULTRASOUND GUIDED PLACEMENT OF A TUNNELED CENTRAL
VENOUS CATHETER

FLUOROSCOPY TIME:  30 seconds, 5.6 mGy

MEDICATIONS:
None

ANESTHESIA/SEDATION:
None

PROCEDURE:
Patient was placed supine on the interventional table. Blood was
able to be aspirated from the port. Port was injected under
fluoroscopic examination. Port was flushed with heparinized saline
at the end of the procedure. Access needle was removed.
FINDINGS: Left subclavian Port-A-Cath with the tip in the SVC. Port aspirates
easily. No evidence for fibrin sheath or port discontinuity. No
evidence for contrast extravasation outside of the port.

COMPLICATIONS:
None
IMPRESSION: Left subclavian Port-A-Cath is functioning properly. The port
aspirates and flushes well. Port is positioned in the SVC and no
significant fibrin sheath.

## 2020-08-02 ENCOUNTER — Inpatient Hospital Stay: Payer: 59

## 2020-08-02 ENCOUNTER — Other Ambulatory Visit: Payer: Self-pay | Admitting: Oncology

## 2020-08-02 VITALS — BP 149/88 | HR 90 | Temp 98.1°F | Resp 17

## 2020-08-02 DIAGNOSIS — Z5112 Encounter for antineoplastic immunotherapy: Secondary | ICD-10-CM | POA: Diagnosis not present

## 2020-08-02 DIAGNOSIS — E860 Dehydration: Secondary | ICD-10-CM

## 2020-08-02 DIAGNOSIS — C3491 Malignant neoplasm of unspecified part of right bronchus or lung: Secondary | ICD-10-CM

## 2020-08-02 DIAGNOSIS — R059 Cough, unspecified: Secondary | ICD-10-CM

## 2020-08-02 LAB — CBC WITH DIFFERENTIAL/PLATELET
Abs Immature Granulocytes: 0.01 10*3/uL (ref 0.00–0.07)
Basophils Absolute: 0.1 10*3/uL (ref 0.0–0.1)
Basophils Relative: 2 %
Eosinophils Absolute: 0.1 10*3/uL (ref 0.0–0.5)
Eosinophils Relative: 3 %
HCT: 35 % — ABNORMAL LOW (ref 39.0–52.0)
Hemoglobin: 12.6 g/dL — ABNORMAL LOW (ref 13.0–17.0)
Immature Granulocytes: 0 %
Lymphocytes Relative: 38 %
Lymphs Abs: 1.2 10*3/uL (ref 0.7–4.0)
MCH: 35.1 pg — ABNORMAL HIGH (ref 26.0–34.0)
MCHC: 36 g/dL (ref 30.0–36.0)
MCV: 97.5 fL (ref 80.0–100.0)
Monocytes Absolute: 0.6 10*3/uL (ref 0.1–1.0)
Monocytes Relative: 20 %
Neutro Abs: 1.2 10*3/uL — ABNORMAL LOW (ref 1.7–7.7)
Neutrophils Relative %: 37 %
Platelets: 119 10*3/uL — ABNORMAL LOW (ref 150–400)
RBC: 3.59 MIL/uL — ABNORMAL LOW (ref 4.22–5.81)
RDW: 13.2 % (ref 11.5–15.5)
WBC: 3.2 10*3/uL — ABNORMAL LOW (ref 4.0–10.5)
nRBC: 0.6 % — ABNORMAL HIGH (ref 0.0–0.2)

## 2020-08-02 LAB — COMPREHENSIVE METABOLIC PANEL
ALT: 16 U/L (ref 0–44)
AST: 24 U/L (ref 15–41)
Albumin: 3.3 g/dL — ABNORMAL LOW (ref 3.5–5.0)
Alkaline Phosphatase: 94 U/L (ref 38–126)
Anion gap: 14 (ref 5–15)
BUN: 14 mg/dL (ref 6–20)
CO2: 25 mmol/L (ref 22–32)
Calcium: 8.8 mg/dL — ABNORMAL LOW (ref 8.9–10.3)
Chloride: 100 mmol/L (ref 98–111)
Creatinine, Ser: 1.13 mg/dL (ref 0.61–1.24)
GFR, Estimated: >60 mL/min (ref >60)
Glucose, Bld: 123 mg/dL — ABNORMAL HIGH (ref 70–99)
Potassium: 3.2 mmol/L — ABNORMAL LOW (ref 3.5–5.1)
Sodium: 139 mmol/L (ref 135–145)
Total Bilirubin: 0.5 mg/dL (ref 0.3–1.2)
Total Protein: 7 g/dL (ref 6.5–8.1)

## 2020-08-02 MED ORDER — POTASSIUM CHLORIDE 20 MEQ/100ML IV SOLN
20.0000 meq | Freq: Once | INTRAVENOUS | Status: AC
  Administered 2020-08-02: 20 meq via INTRAVENOUS

## 2020-08-02 MED ORDER — SODIUM CHLORIDE 0.9 % IV SOLN
Freq: Once | INTRAVENOUS | Status: AC
  Filled 2020-08-02: qty 250

## 2020-08-02 MED ORDER — KLOR-CON M20 20 MEQ PO TBCR: 20.0000 meq | EXTENDED_RELEASE_TABLET | Freq: Every day | ORAL | 0 refills | Status: DC

## 2020-08-02 MED ORDER — SODIUM CHLORIDE 0.9% FLUSH
10.0000 mL | Freq: Once | INTRAVENOUS | Status: AC | PRN
  Administered 2020-08-02: 10 mL
  Filled 2020-08-02: qty 10

## 2020-08-02 MED ORDER — HEPARIN SOD (PORK) LOCK FLUSH 100 UNIT/ML IV SOLN
500.0000 [IU] | Freq: Once | INTRAVENOUS | Status: AC | PRN
  Administered 2020-08-02: 500 [IU]
  Filled 2020-08-02: qty 5

## 2020-08-02 NOTE — Progress Notes (Signed)
Pt receiving IV hydration and potassium. OK to run 20 KCL over 1 hour per Dr Tasia Catchings. Patient has a port.

## 2020-08-03 ENCOUNTER — Telehealth: Payer: Self-pay

## 2020-08-03 NOTE — Telephone Encounter (Signed)
Dr. Tasia Catchings, pt request 90 day supply of potassium.  The rx you sent was for #30 with no refills.  You want to prescribe #90?

## 2020-08-03 NOTE — Telephone Encounter (Signed)
Patient notified

## 2020-08-03 NOTE — Telephone Encounter (Signed)
-----   Message from Earlie Server, MD sent at 08/02/2020  9:47 PM EST ----- Please advise him to take potassium chloride 56meq daily. Rx sent.

## 2020-08-04 MED ORDER — KLOR-CON M20 20 MEQ PO TBCR: 20.0000 meq | EXTENDED_RELEASE_TABLET | Freq: Every day | ORAL | 0 refills | Status: DC

## 2020-08-04 NOTE — Telephone Encounter (Signed)
Dr. Tasia Catchings approves K+ 90 da supply.  Rx sent to pharmacy.

## 2020-08-04 NOTE — Addendum Note (Signed)
Addended by: Vanice Sarah on: 08/04/2020 10:05 AM   Modules accepted: Orders

## 2020-08-10 ENCOUNTER — Inpatient Hospital Stay: Payer: 59

## 2020-08-10 ENCOUNTER — Inpatient Hospital Stay: Payer: 59 | Attending: Oncology

## 2020-08-10 ENCOUNTER — Encounter: Payer: Self-pay | Admitting: Oncology

## 2020-08-10 ENCOUNTER — Inpatient Hospital Stay (HOSPITAL_BASED_OUTPATIENT_CLINIC_OR_DEPARTMENT_OTHER): Payer: 59 | Admitting: Oncology

## 2020-08-10 VITALS — BP 160/84 | HR 97 | Temp 96.9°F | Resp 18 | Wt 141.0 lb

## 2020-08-10 VITALS — BP 148/92 | HR 90 | Resp 18

## 2020-08-10 DIAGNOSIS — C3491 Malignant neoplasm of unspecified part of right bronchus or lung: Secondary | ICD-10-CM | POA: Diagnosis not present

## 2020-08-10 DIAGNOSIS — R110 Nausea: Secondary | ICD-10-CM

## 2020-08-10 DIAGNOSIS — D6959 Other secondary thrombocytopenia: Secondary | ICD-10-CM | POA: Insufficient documentation

## 2020-08-10 DIAGNOSIS — Z8249 Family history of ischemic heart disease and other diseases of the circulatory system: Secondary | ICD-10-CM | POA: Insufficient documentation

## 2020-08-10 DIAGNOSIS — Z7951 Long term (current) use of inhaled steroids: Secondary | ICD-10-CM | POA: Diagnosis not present

## 2020-08-10 DIAGNOSIS — Z5111 Encounter for antineoplastic chemotherapy: Secondary | ICD-10-CM | POA: Diagnosis not present

## 2020-08-10 DIAGNOSIS — J439 Emphysema, unspecified: Secondary | ICD-10-CM | POA: Diagnosis not present

## 2020-08-10 DIAGNOSIS — I2510 Atherosclerotic heart disease of native coronary artery without angina pectoris: Secondary | ICD-10-CM | POA: Insufficient documentation

## 2020-08-10 DIAGNOSIS — Z79899 Other long term (current) drug therapy: Secondary | ICD-10-CM | POA: Diagnosis not present

## 2020-08-10 DIAGNOSIS — Z833 Family history of diabetes mellitus: Secondary | ICD-10-CM | POA: Insufficient documentation

## 2020-08-10 DIAGNOSIS — E876 Hypokalemia: Secondary | ICD-10-CM | POA: Diagnosis not present

## 2020-08-10 DIAGNOSIS — I10 Essential (primary) hypertension: Secondary | ICD-10-CM | POA: Insufficient documentation

## 2020-08-10 DIAGNOSIS — Z801 Family history of malignant neoplasm of trachea, bronchus and lung: Secondary | ICD-10-CM | POA: Insufficient documentation

## 2020-08-10 DIAGNOSIS — Z902 Acquired absence of lung [part of]: Secondary | ICD-10-CM | POA: Insufficient documentation

## 2020-08-10 DIAGNOSIS — Z87891 Personal history of nicotine dependence: Secondary | ICD-10-CM | POA: Insufficient documentation

## 2020-08-10 DIAGNOSIS — C3411 Malignant neoplasm of upper lobe, right bronchus or lung: Secondary | ICD-10-CM | POA: Diagnosis present

## 2020-08-10 DIAGNOSIS — T451X5A Adverse effect of antineoplastic and immunosuppressive drugs, initial encounter: Secondary | ICD-10-CM

## 2020-08-10 DIAGNOSIS — R059 Cough, unspecified: Secondary | ICD-10-CM

## 2020-08-10 DIAGNOSIS — Z803 Family history of malignant neoplasm of breast: Secondary | ICD-10-CM | POA: Insufficient documentation

## 2020-08-10 DIAGNOSIS — Z5112 Encounter for antineoplastic immunotherapy: Secondary | ICD-10-CM | POA: Insufficient documentation

## 2020-08-10 LAB — CBC WITH DIFFERENTIAL/PLATELET
Abs Immature Granulocytes: 0.03 10*3/uL (ref 0.00–0.07)
Basophils Absolute: 0 10*3/uL (ref 0.0–0.1)
Basophils Relative: 0 %
Eosinophils Absolute: 0.1 10*3/uL (ref 0.0–0.5)
Eosinophils Relative: 2 %
HCT: 36.4 % — ABNORMAL LOW (ref 39.0–52.0)
Hemoglobin: 12.9 g/dL — ABNORMAL LOW (ref 13.0–17.0)
Immature Granulocytes: 0 %
Lymphocytes Relative: 13 %
Lymphs Abs: 0.9 10*3/uL (ref 0.7–4.0)
MCH: 35 pg — ABNORMAL HIGH (ref 26.0–34.0)
MCHC: 35.4 g/dL (ref 30.0–36.0)
MCV: 98.6 fL (ref 80.0–100.0)
Monocytes Absolute: 0.9 10*3/uL (ref 0.1–1.0)
Monocytes Relative: 13 %
Neutro Abs: 4.9 10*3/uL (ref 1.7–7.7)
Neutrophils Relative %: 72 %
Platelets: 142 10*3/uL — ABNORMAL LOW (ref 150–400)
RBC: 3.69 MIL/uL — ABNORMAL LOW (ref 4.22–5.81)
RDW: 14.6 % (ref 11.5–15.5)
WBC: 6.8 10*3/uL (ref 4.0–10.5)
nRBC: 0 % (ref 0.0–0.2)

## 2020-08-10 LAB — COMPREHENSIVE METABOLIC PANEL
ALT: 14 U/L (ref 0–44)
AST: 21 U/L (ref 15–41)
Albumin: 3.4 g/dL — ABNORMAL LOW (ref 3.5–5.0)
Alkaline Phosphatase: 91 U/L (ref 38–126)
Anion gap: 12 (ref 5–15)
BUN: 10 mg/dL (ref 6–20)
CO2: 25 mmol/L (ref 22–32)
Calcium: 9.1 mg/dL (ref 8.9–10.3)
Chloride: 101 mmol/L (ref 98–111)
Creatinine, Ser: 0.97 mg/dL (ref 0.61–1.24)
GFR, Estimated: >60 mL/min (ref >60)
Glucose, Bld: 103 mg/dL — ABNORMAL HIGH (ref 70–99)
Potassium: 3.4 mmol/L — ABNORMAL LOW (ref 3.5–5.1)
Sodium: 138 mmol/L (ref 135–145)
Total Bilirubin: 1.2 mg/dL (ref 0.3–1.2)
Total Protein: 7.3 g/dL (ref 6.5–8.1)

## 2020-08-10 MED ORDER — SODIUM CHLORIDE 0.9 % IV SOLN
10.0000 mg | Freq: Once | INTRAVENOUS | Status: AC
  Administered 2020-08-10: 10 mg via INTRAVENOUS
  Filled 2020-08-10: qty 10

## 2020-08-10 MED ORDER — SODIUM CHLORIDE 0.9 % IV SOLN
Freq: Once | INTRAVENOUS | Status: AC
  Filled 2020-08-10: qty 250

## 2020-08-10 MED ORDER — DIPHENHYDRAMINE HCL 50 MG/ML IJ SOLN
50.0000 mg | Freq: Once | INTRAMUSCULAR | Status: AC
  Administered 2020-08-10: 50 mg via INTRAVENOUS
  Filled 2020-08-10: qty 1

## 2020-08-10 MED ORDER — HEPARIN SOD (PORK) LOCK FLUSH 100 UNIT/ML IV SOLN
INTRAVENOUS | Status: AC
  Filled 2020-08-10: qty 5

## 2020-08-10 MED ORDER — SODIUM CHLORIDE 0.9 % IV SOLN
200.0000 mg/m2 | Freq: Once | INTRAVENOUS | Status: AC
  Administered 2020-08-10: 360 mg via INTRAVENOUS
  Filled 2020-08-10: qty 60

## 2020-08-10 MED ORDER — SODIUM CHLORIDE 0.9 % IV SOLN
200.0000 mg | Freq: Once | INTRAVENOUS | Status: AC
  Administered 2020-08-10: 200 mg via INTRAVENOUS
  Filled 2020-08-10: qty 8

## 2020-08-10 MED ORDER — PROCHLORPERAZINE MALEATE 10 MG PO TABS: 10.0000 mg | ORAL_TABLET | Freq: Four times a day (QID) | ORAL | 3 refills | Status: DC | PRN

## 2020-08-10 MED ORDER — SODIUM CHLORIDE 0.9 % IV SOLN
150.0000 mg | Freq: Once | INTRAVENOUS | Status: AC
  Administered 2020-08-10: 150 mg via INTRAVENOUS
  Filled 2020-08-10: qty 150

## 2020-08-10 MED ORDER — FAMOTIDINE IN NACL 20-0.9 MG/50ML-% IV SOLN
20.0000 mg | Freq: Once | INTRAVENOUS | Status: AC
  Administered 2020-08-10: 20 mg via INTRAVENOUS
  Filled 2020-08-10: qty 50

## 2020-08-10 MED ORDER — PALONOSETRON HCL INJECTION 0.25 MG/5ML
0.2500 mg | Freq: Once | INTRAVENOUS | Status: AC
  Administered 2020-08-10: 0.25 mg via INTRAVENOUS
  Filled 2020-08-10: qty 5

## 2020-08-10 MED ORDER — SODIUM CHLORIDE 0.9 % IV SOLN
500.0000 mg | Freq: Once | INTRAVENOUS | Status: AC
  Administered 2020-08-10: 500 mg via INTRAVENOUS
  Filled 2020-08-10: qty 50

## 2020-08-10 MED ORDER — ONDANSETRON HCL 8 MG PO TABS: 8.0000 mg | ORAL_TABLET | Freq: Three times a day (TID) | ORAL | 3 refills | Status: DC | PRN

## 2020-08-10 MED ORDER — LORAZEPAM 2 MG/ML IJ SOLN
0.5000 mg | Freq: Once | INTRAMUSCULAR | Status: AC | PRN
  Administered 2020-08-10: 0.5 mg via INTRAVENOUS
  Filled 2020-08-10: qty 1

## 2020-08-10 MED ORDER — HEPARIN SOD (PORK) LOCK FLUSH 100 UNIT/ML IV SOLN
500.0000 [IU] | Freq: Once | INTRAVENOUS | Status: AC | PRN
  Administered 2020-08-10: 500 [IU]
  Filled 2020-08-10: qty 5

## 2020-08-10 MED ORDER — SLOW-MAG 71.5-119 MG PO TBEC: 1.0000 | DELAYED_RELEASE_TABLET | Freq: Two times a day (BID) | ORAL | 1 refills | Status: DC

## 2020-08-10 NOTE — Progress Notes (Signed)
Pt here for follow up. No new concerns voiced.   

## 2020-08-10 NOTE — Progress Notes (Signed)
Hematology/Oncology Follow up note [facility] [phone] [phone]   Patient Care Team: Tracie Harrier, MD as PCP - General (Internal Medicine) Earlie Server, MD as Consulting Physician (Hematology and Oncology)  REASON FOR VISIT:  Follow up for treatment of squamous lung cancer.   HISTORY OF PRESENTING ILLNESS:  # Dec 2019 Stage I squamous lung cancer #s/p Bronchoscopy on 06/25/2018. subcarina EBUS FNA was non diagnostic, hypocellular specimen.  # 08/13/2018 CT guided right upper lobe biopsy pathology showed dense fibrosis and mixed inflammatory cells with prominent polytypic plasma cells component. Focal benign bronchial wall and alveolar spaces. No malignancy was identified.   # 08/13/2018 underwent right thoracotomy and wedge resection of a right upper lobe mass.  Frozen section was consistent with an inflammatory process.  On the second postop day, preliminary pathology reports high-grade malignancy.  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  08/20/2018 Patient was therefore taken back to the OR and underwent take complete lobectomy  pT1b pN0 cM0 stage I squamous cell lung cancer.  Margin is negative.  Recommend observation.    # 12/03/2018 CT chest w contrast showed local recurrence.  12/10/2018 PET showed No evidence of distant metastatic disease # 12/26/2018 s/p bronchoscopy biopsy. Confirmed local recurrence of squamous cell lung cancer.  medi port placed by Dr.Oaks.  # 06/14/2020 CT chest w contrast was reviewed and discussed with patient.  Evidence of progressive lymphangitic spread of tumor throughout the right lung, with contralateral lung nodules with  right pleural effusion  MRI brain is negative for CNS involvement.  CT abdomen with contrast showed no evidence of abdominal metastatic disease PET scan was not approved by insurance-peer to peer appeal with Dr.Vipul Bhanderi   # NGS - KRAS V14L,  TMB 26.6-high, MS stable, PD-L1  5% CDKN2C G48, DICER1c.2117-1G>T, FLT4 G1131S, NF1 A44f, NF1 L2643*, STK11 [id number] treatment Started concurrent chemoradiation on 01/16/2019 Carboplatin AUC of 2 and Taxol 45 mg/m2 weekly finished in 03/05/2019 04/10/2019, patient started on durvalumab maintenance. -CT showed progression. 07/16/2020 started on carboplatin/paclitaxel/Keytruda  INTERVAL HISTORY Lemon K STEFEN JUBAs a [age].o. male who has above history reviewed by me today presents for reatment of  recurrent  squamous cell lung cancer. Patient had nausea after last cycle of chemotherapy. No vomiting. Antiemetics ran out. Patient did not notify [place] today. Has lost some weight since last visit. Appetite is fair. No new complaints.    Review of Systems  Constitutional: Positive for fatigue. Negative for appetite change, chills, fever and unexpected weight change.  HENT:   Negative for hearing loss and voice change.   Eyes: Negative for eye problems and icterus.  Respiratory: Negative for chest tightness, cough and shortness of breath.   Cardiovascular: Negative for chest pain and leg swelling.  Gastrointestinal: Positive for nausea. Negative for abdominal distention and abdominal pain.  Endocrine: Negative for hot flashes.  Genitourinary: Negative for difficulty urinating, dysuria and frequency.   Musculoskeletal: Negative for arthralgias.  Skin: Negative for itching and rash.  Neurological: Negative for light-headedness and numbness.  Hematological: Negative for adenopathy. Does not bruise/bleed easily.  Psychiatric/Behavioral: Negative for confusion.    MEDICAL HISTORY:  Past Medical History:  Diagnosis Date  . Alcohol abuse    usually drinks 2-3 drinks per day  . Atherosclerosis 06/2018  . Chronic sinusitis   . Dehydration 02/07/2019  . Emphysema of lung ([facility]/[place]   patient unaware of this.  . Hip fracture ([facility]/2019   no surgery  .  History of kidney stones 05/2018   per xray, bilateral nephrolitiasis   . Hypertension   . Squamous cell carcinoma of lung, right ([place]) 06/2018    SURGICAL HISTORY: Past Surgical History:  Procedure Laterality Date  . BRAIN SURGERY  10/2017   nasal/sinus endoscopy. mass benign  . ELECTROMAGNETIC NAVIGATION BROCHOSCOPY Right 06/25/2018   Procedure: ELECTROMAGNETIC NAVIGATION BRONCHOSCOPY;  Surgeon: [name], MD;  Location: [facility] ORS;  Service: Cardiopulmonary;  Laterality: Right;  . ENDOBRONCHIAL ULTRASOUND Right 06/25/2018   Procedure: ENDOBRONCHIAL ULTRASOUND;  Surgeon: [name], MD;  Location: [facility] OR[facility];  Service: Cardiopulmonary;  Laterality: Right;  . ENDOBRONCHIAL ULTRASOUND Right 12/26/2018   Procedure: ENDOBRONCHIAL ULTRASOUND RIGHT;  Surgeon: [name], MD;  Location: [facility] OR[facility];  Service: Cardiopulmonary;  Laterality: Right;  . FLEXIBLE BRONCHOSCOPY N/A 08/20/2018   Procedure: FLEXIBLE BRONCHOSCOPY PREOP;  Surgeon: [name], MD;  Location: [facility] ORS;  Service: Thoracic;  Laterality: N/A;  . IR CV LINE INJECTION  04/04/2019  . NASAL SINUS SURGERY  10/2017   [facility], frontal sinusotomy, ethmoidectomy, resection anterior cranial fossa neoplasm, turbinate resection  . PORTACATH PLACEMENT Left 01/15/2019   Procedure: INSERTION PORT-A-CATH;  Surgeon: [name], MD;  Location: [facility] ORS;  Service: General;  Laterality: Left;  . THORACOTOMY Right 08/13/2018   Procedure: PREOP BROCHOSCOPY WITH RIGHT THORACOTOMY AND RUL RESECTION;  Surgeon: [name], MD;  Location: [facility] OR[facility];  Service: General;  Laterality: Right;  . THORACOTOMY Right 08/20/2018   Procedure: THORACOTOMY MAJOR RIGHT UPPER LOBE LOBECTOMY;  Surgeon: [name], MD;  Location: [facility] ORS;  Service: Thoracic;  Laterality: Right;  . TOE SURGERY Left    pin in left toe    SOCIAL HISTORY: Social History   Socioeconomic History  . Marital status: Married    Spouse name: lisa  . Number of children: Not on file  . Years of education: Not on file  . Highest education level: Not on  file  Occupational History  . Occupation: welding    Comment: taking time off to resolve issues  Tobacco Use  . Smoking status: Former Smoker    Packs/day: 0.50    Years: 15.00    Pack years: 7.50    Types: Cigarettes    Quit date: 08/2018    Years since quitting: 2.0  . Smokeless tobacco: Never Used  Vaping Use  . Vaping Use: Never used  Substance and Sexual Activity  . Alcohol use: Yes    Alcohol/week: 3.0 standard drinks    Types: 3 Cans of beer per week    Comment: usually 2 drinks per week, per patient  . Drug use: No  . Sexual activity: Not on file  Other Topics Concern  . Not on file  Social History Narrative  . Not on file   Social Determinants of Health   Financial Resource Strain: Not on file  Food Insecurity: Not on file  Transportation Needs: Not on file  Physical Activity: Not on file  Stress: Not on file  Social Connections: Not on file  Intimate Partner Violence: Not on file    FAMILY HISTORY: Family History  Problem Relation Age of Onset  . Breast cancer Mother   . Diabetes Mother   . Lung cancer Father   . Hypertension Father     ALLERGIES:  has No Known Allergies.  MEDICATIONS:  Current Outpatient Medications  Medication Sig Dispense Refill  . albuterol (VENTOLIN HFA) 108 (90 Base) MCG/ACT inhaler TAKE 2 PUFFS BY MOUTH EVERY 6 HOURS AS NEEDED  FOR WHEEZE OR SHORTNESS OF BREATH 6.7 each 6  . amoxicillin (AMOXIL) 875 MG tablet Take 875 mg by mouth 2 (two) times daily. (Patient not taking: No sig reported)    . cloNIDine (CATAPRES) 0.1 MG tablet Take 0.1 mg by mouth daily.   5  . diltiazem (CARDIZEM CD) 120 MG 24 hr capsule Take 120 mg by mouth daily.    . folic acid (V-R FOLIC ACID) 960 MCG tablet Take 1 tablet (400 mcg total) by mouth daily. 90 tablet 1  . hydrALAZINE (APRESOLINE) 100 MG tablet Take 50 mg by mouth 2 (two) times daily.     . hydrochlorothiazide (HYDRODIURIL) 12.5 MG tablet Take 12.5 mg by mouth daily.     Marland Kitchen ibuprofen (ADVIL)  600 MG tablet Take 600 mg by mouth every 6 (six) hours.    Marland Kitchen KLOR-CON M20 20 MEQ tablet Take 1 tablet (20 mEq total) by mouth daily. 90 tablet 0  . latanoprost (XALATAN) 0.005 % ophthalmic solution 1 drop at bedtime.    . lidocaine-prilocaine (EMLA) cream Apply to affected area once 30 g 3  . magnesium chloride (SLOW-MAG) 64 MG TBEC SR tablet Take 1 tablet (64 mg total) by mouth 2 (two) times daily. TAKE 1 TABLET (64 MG TOTAL) BY MOUTH DAILY. 60 tablet 1  . methocarbamol (ROBAXIN) 500 MG tablet Take 1 tablet (500 mg total) by mouth at bedtime. 30 tablet 0  . olmesartan (BENICAR) 40 MG tablet Take 40 mg by mouth every other day.     . ondansetron (ZOFRAN) 8 MG tablet Take 1 tablet (8 mg total) by mouth 2 (two) times daily as needed for refractory nausea / vomiting. Start on day 3 after chemo. 30 tablet 1  . prochlorperazine (COMPAZINE) 10 MG tablet Take 1 tablet (10 mg total) by mouth every 6 (six) hours as needed (Nausea or vomiting). 30 tablet 1  . SPIRIVA HANDIHALER 18 MCG inhalation capsule INHALE 1 CAPSULE VIA HANDIHALER ONCE DAILY AT THE SAME TIME EVERY DAY 30 capsule 0  . SYMBICORT 80-4.5 MCG/ACT inhaler INHALE 2 PUFFS INTO THE LUNGS IN THE MORNING AND AT BEDTIME. 10.2 each 6  . vitamin B-12 (CYANOCOBALAMIN) 1000 MCG tablet Take 1 tablet (1,000 mcg total) by mouth daily. 90 tablet 1   No current facility-administered medications for this visit.   Facility-Administered Medications Ordered in Other Visits  Medication Dose Route Frequency Provider Last Rate Last Admin  . albuterol (PROVENTIL) (2.5 MG/3ML) 0.083% nebulizer solution 2.5 mg  2.5 mg Nebulization Once System, Provider Not In      . heparin lock flush 100 unit/mL  500 Units Intravenous Once Earlie Server, MD      . sodium chloride flush (NS) 0.9 % injection 10 mL  10 mL Intravenous PRN Earlie Server, MD   10 mL at 04/04/19 0903  . sodium chloride flush (NS) 0.9 % injection 10 mL  10 mL Intravenous PRN Earlie Server, MD   10 mL at 07/26/20 1308      PHYSICAL EXAMINATION: ECOG PERFORMANCE STATUS: 1 - Symptomatic but completely ambulatory Vitals:   08/10/20 0847  BP: (!) 160/84  Pulse: 97  Resp: 18  Temp: (!) 96.9 F (36.1 C)  SpO2: 99%   Filed Weights   08/10/20 0847  Weight: 141 lb (64 kg)    Physical Exam Constitutional:      General: He is not in acute distress. HENT:     Head: Normocephalic and atraumatic.  Eyes:     General: No  scleral icterus. Cardiovascular:     Rate and Rhythm: Regular rhythm. Tachycardia present.     Heart sounds: Normal heart sounds.  Pulmonary:     Effort: Pulmonary effort is normal. No respiratory distress.     Breath sounds: No wheezing.  Abdominal:     General: Bowel sounds are normal. There is no distension.     Palpations: Abdomen is soft.  Musculoskeletal:        General: No deformity. Normal range of motion.     Cervical back: Normal range of motion and neck supple.  Skin:    General: Skin is warm and dry.     Findings: No erythema or rash.  Neurological:     Mental Status: He is alert and oriented to person, place, and time. Mental status is at baseline.     Cranial Nerves: No cranial nerve deficit.     Coordination: Coordination normal.  Psychiatric:        Mood and Affect: Mood normal.      LABORATORY DATA:  I have reviewed the data as listed Lab Results  Component Value Date   WBC 3.2 (L) 08/02/2020   HGB 12.6 (L) 08/02/2020   HCT 35.0 (L) 08/02/2020   MCV 97.5 08/02/2020   PLT 119 (L) 08/02/2020   Recent Labs    03/17/20 0830 03/31/20 0825 04/07/20 0854 04/22/20 0825 07/16/20 0815 07/26/20 1308 08/02/20 1258  NA 134* 137 137   < > 137 133* 139  K 4.6 3.9 3.7   < > 4.2 3.6 3.2*  CL 101 102 104   < > 103 96* 100  CO2 20* 23 21*   < > _0 GLUCOSE 167* 126* 163*   < > 101* 94 123*  BUN _1 < > 9 21* 14  CREATININE 1.19 1.10 1.10   < > 1.04 1.06 1.13  CALCIUM 9.0 9.1 8.8*   < > 9.3 8.7* 8.8*  GFRNONAA >60 >60 >60   < > >60 >60 >60   GFRAA >60 >60 >60  --   --   --   --   PROT 7.7 7.6 7.1   < > 7.7 7.1 7.0  ALBUMIN 3.5 3.5 3.4*   < > 3.8 3.6 3.3*  AST 32 29 25   < > _2 ALT _3 < > _4 ALKPHOS 86 87 76   < > 93 71 94  BILITOT 0.8 0.7 0.5   < > 1.2 0.6 0.5   < > = values in this interval not displayed.   Iron/TIBC/Ferritin/ %Sat No results found for: IRON, TIBC, FERRITIN, IRONPCTSAT   RADIOGRAPHIC STUDIES: I have personally reviewed the radiological images as listed and agreed with the findings in the report. CT Chest W Contrast  Result Date: 06/14/2020 CLINICAL DATA:  55 year old male with history of lung cancer. Follow-up study. EXAM: CT CHEST WITH CONTRAST TECHNIQUE: Multidetector CT imaging of the chest was performed during intravenous contrast administration. CONTRAST:  13m OMNIPAQUE IOHEXOL 300 MG/ML  SOLN COMPARISON:  Chest CT 04/21/2020. FINDINGS: Cardiovascular: Heart size is normal. Small amount of pericardial fluid and/or thickening, increased compared to the prior study, but unlikely to be of hemodynamic significance at this time. No associated pericardial calcification. There is aortic atherosclerosis, as well as atherosclerosis of the great vessels of the mediastinum and the coronary arteries, including calcified atherosclerotic plaque in the left anterior descending coronary artery. Left-sided  subclavian single-lumen porta cath with tip terminating in the distal superior vena cava. Mediastinum/Nodes: No pathologically enlarged mediastinal or hilar lymph nodes. Esophagus is unremarkable in appearance. No axillary lymphadenopathy. Lungs/Pleura: Status post right upper lobectomy. Compensatory hyperexpansion of the right middle and lower lobes. Increasing areas of nodular septal thickening throughout the right lung, most evident in the right middle lobe where the largest of these nodules measures up to 11 x 8 mm (axial image 62 of series 3). Several other scattered tiny 2-5 mm pulmonary nodules are  also noted in the left lung. Small right pleural effusion with pleural enhancement Upper Abdomen: Multiple tiny 2-3 mm nonobstructive calculi in the collecting systems of both kidneys. Aortic atherosclerosis. Aortic atherosclerosis. Musculoskeletal: There are no aggressive appearing lytic or blastic lesions noted in the visualized portions of the skeleton. IMPRESSION: 1. Evidence of progressive lymphangitic spread of tumor throughout the right lung, with additional pulmonary nodules in the contralateral lung suspicious for potential hematogenous metastasis. In addition, there is a small right pleural effusion with areas of pleural enhancement, which could indicate a developing malignant pleural effusion. 2. Multiple nonobstructive calculi in the collecting systems of both kidneys. 3. Aortic atherosclerosis, in addition to left anterior descending coronary artery disease. Please note that although the presence of coronary artery calcium documents the presence of coronary artery disease, the severity of this disease and any potential stenosis cannot be assessed on this non-gated CT examination. Assessment for potential risk factor modification, dietary therapy or pharmacologic therapy may be warranted, if clinically indicated. Aortic Atherosclerosis (ICD10-I70.0). Electronically Signed   By: Vinnie Langton M.D.   On: 06/14/2020 10:41   CT Abdomen W Contrast  Result Date: 07/06/2020 CLINICAL DATA:  Right lung squamous cell carcinoma.  Staging. EXAM: CT ABDOMEN WITH CONTRAST TECHNIQUE: Multidetector CT imaging of the abdomen was performed using the standard protocol following bolus administration of intravenous contrast. CONTRAST:  151m OMNIPAQUE IOHEXOL 300 MG/ML  SOLN [facility]  [facility] FINDINGS: Lower chest: Tiny right pleural effusion, as seen on recent chest CT. Hepatobiliary: No hepatic masses identified. Gallbladder is unremarkable. No evidence of biliary  ductal dilatation. Pancreas:  No mass or inflammatory changes. Spleen:  Within normal limits in size and appearance. Adrenals/Urinary Tract: Normal adrenal glands. Mild right renal parenchymal scarring. Stable tiny cyst in lower pole of left kidney. No masses identified. A few tiny 1-2 mm renal calculi are again seen bilaterally, however there is no evidence of hydronephrosis. Stomach/Bowel: Visualized portion unremarkable. Vascular/Lymphatic: No pathologically enlarged lymph nodes identified. No abdominal aortic aneurysm. Aortic atherosclerotic calcification noted. Other:  None. Musculoskeletal:  No suspicious bone lesions identified. IMPRESSION: No evidence of abdominal metastatic disease or other acute findings. Bilateral nephrolithiasis. No evidence of hydronephrosis. Electronically Signed   By: JMarlaine HindM.D.   On: 07/06/2020 15:49   MR Brain W Wo Contrast  Result Date: 07/01/2020 CLINICAL DATA:  Lung cancer patient.  Yearly follow-up. EXAM: MRI HEAD WITHOUT AND WITH CONTRAST TECHNIQUE: Multiplanar, multiecho pulse sequences of the brain and surrounding structures were obtained without and with intravenous contrast. CONTRAST:  611mGADAVIST GADOBUTROL 1 MMOL/ML IV SOLN COMPARISON:  01/20/2019 FINDINGS: Brain: Mild generalized volume loss. Diffusion imaging does not show any acute or subacute infarction or other cause of restricted diffusion. Mild chronic small-vessel change affects the pons. No focal cerebellar finding. Cerebral hemispheres show mild to moderate chronic small-vessel change of the white matter, advanced for age. No cortical or large vessel territory infarction. No mass  lesion, hemorrhage, hydrocephalus or extra-axial collection. After contrast administration, no abnormal enhancement occurs. Vascular: Major vessels at the base of the brain show flow. Skull and upper cervical spine: Negative Sinuses/Orbits: Clear sinuses presently. Previous functional endoscopic sinus surgery. No acute  orbital finding. Dysconjugate gaze. Other: None IMPRESSION: 1. No evidence of metastatic disease. 2. Brain atrophy and chronic small-vessel change of the pons and cerebral hemispheric white matter, advanced for age. Electronically Signed   By: Nelson Chimes M.D.   On: 07/01/2020 08:55     ASSESSMENT & PLAN:  1. Squamous cell carcinoma of lung, right ([place])   2. Encounter for antineoplastic chemotherapy   3. Encounter for antineoplastic immunotherapy   4. Hypomagnesemia   5. Chemotherapy-induced nausea   6. Hypokalemia    #Recurrent Squamous cell carcinoma of lung, stage IV S/p cycle 1 Carboplatin Taxol and Keytruda.  He tolerates well with mild difficulties . Labs reviewed and discussed with patient Proceed with cycle 2 carboplatin Taxol and Keytruda.  #Chemotherapy-induced nausea, no vomiting. I reviewed his antiemetics including Zofran and Compazine. Discussed about instructions. #Chronic hypomagnesia. Continue  slow magnesium twice daily. #Chronic hypokalemia, continue potassium supplementation 20 mEq daily. Potassium is stable today at 3.4. # Thrombocytopenia, due to chemo.  Stable.  Monitor. Return of visit:  3 weeks for evaluation of next cycle of treatment  Earlie Server, MD, PhD Hematology [place] 08/10/2020

## 2020-08-10 NOTE — Progress Notes (Signed)
Patient stable at discharge.

## 2020-08-16 ENCOUNTER — Telehealth: Payer: Self-pay

## 2020-08-16 NOTE — Telephone Encounter (Signed)
FMLA forms for Samuel Choi (Wife) faxed to the [place]. Leave ID: [id number]. Fax confirmation received and Fmla form sent to scan in chart.

## 2020-08-31 ENCOUNTER — Encounter: Payer: Self-pay | Admitting: Oncology

## 2020-08-31 ENCOUNTER — Inpatient Hospital Stay: Payer: 59

## 2020-08-31 ENCOUNTER — Other Ambulatory Visit: Payer: Self-pay

## 2020-08-31 ENCOUNTER — Inpatient Hospital Stay (HOSPITAL_BASED_OUTPATIENT_CLINIC_OR_DEPARTMENT_OTHER): Payer: 59 | Admitting: Oncology

## 2020-08-31 ENCOUNTER — Encounter: Payer: Self-pay | Admitting: Radiation Oncology

## 2020-08-31 VITALS — BP 152/89 | HR 90 | Resp 20

## 2020-08-31 VITALS — BP 171/86 | HR 98 | Temp 98.2°F | Resp 16 | Wt 139.3 lb

## 2020-08-31 DIAGNOSIS — Z5111 Encounter for antineoplastic chemotherapy: Secondary | ICD-10-CM

## 2020-08-31 DIAGNOSIS — D696 Thrombocytopenia, unspecified: Secondary | ICD-10-CM

## 2020-08-31 DIAGNOSIS — T451X5A Adverse effect of antineoplastic and immunosuppressive drugs, initial encounter: Secondary | ICD-10-CM

## 2020-08-31 DIAGNOSIS — C3491 Malignant neoplasm of unspecified part of right bronchus or lung: Secondary | ICD-10-CM

## 2020-08-31 DIAGNOSIS — Z5112 Encounter for antineoplastic immunotherapy: Secondary | ICD-10-CM

## 2020-08-31 DIAGNOSIS — E876 Hypokalemia: Secondary | ICD-10-CM

## 2020-08-31 DIAGNOSIS — R059 Cough, unspecified: Secondary | ICD-10-CM

## 2020-08-31 DIAGNOSIS — R110 Nausea: Secondary | ICD-10-CM

## 2020-08-31 DIAGNOSIS — E860 Dehydration: Secondary | ICD-10-CM

## 2020-08-31 LAB — CBC WITH DIFFERENTIAL/PLATELET
Abs Immature Granulocytes: 0.03 K/uL (ref 0.00–0.07)
Basophils Absolute: 0.1 K/uL (ref 0.0–0.1)
Basophils Relative: 1 %
Eosinophils Absolute: 0.2 K/uL (ref 0.0–0.5)
Eosinophils Relative: 2 %
HCT: 34.6 % — ABNORMAL LOW (ref 39.0–52.0)
Hemoglobin: 12.2 g/dL — ABNORMAL LOW (ref 13.0–17.0)
Immature Granulocytes: 0 %
Lymphocytes Relative: 8 %
Lymphs Abs: 0.9 K/uL (ref 0.7–4.0)
MCH: 36.9 pg — ABNORMAL HIGH (ref 26.0–34.0)
MCHC: 35.3 g/dL (ref 30.0–36.0)
MCV: 104.5 fL — ABNORMAL HIGH (ref 80.0–100.0)
Monocytes Absolute: 1.2 K/uL — ABNORMAL HIGH (ref 0.1–1.0)
Monocytes Relative: 11 %
Neutro Abs: 9.2 K/uL — ABNORMAL HIGH (ref 1.7–7.7)
Neutrophils Relative %: 78 %
Platelets: 93 K/uL — ABNORMAL LOW (ref 150–400)
RBC: 3.31 MIL/uL — ABNORMAL LOW (ref 4.22–5.81)
RDW: 17.2 % — ABNORMAL HIGH (ref 11.5–15.5)
WBC: 11.6 K/uL — ABNORMAL HIGH (ref 4.0–10.5)
nRBC: 0.3 % — ABNORMAL HIGH (ref 0.0–0.2)

## 2020-08-31 LAB — COMPREHENSIVE METABOLIC PANEL
ALT: 13 U/L (ref 0–44)
AST: 31 U/L (ref 15–41)
Albumin: 3.4 g/dL — ABNORMAL LOW (ref 3.5–5.0)
Alkaline Phosphatase: 101 U/L (ref 38–126)
Anion gap: 12 (ref 5–15)
BUN: 7 mg/dL (ref 6–20)
CO2: 25 mmol/L (ref 22–32)
Calcium: 8.9 mg/dL (ref 8.9–10.3)
Chloride: 100 mmol/L (ref 98–111)
Creatinine, Ser: 0.79 mg/dL (ref 0.61–1.24)
GFR, Estimated: >60 mL/min (ref >60)
Glucose, Bld: 107 mg/dL — ABNORMAL HIGH (ref 70–99)
Potassium: 3.1 mmol/L — ABNORMAL LOW (ref 3.5–5.1)
Sodium: 137 mmol/L (ref 135–145)
Total Bilirubin: 2 mg/dL — ABNORMAL HIGH (ref 0.3–1.2)
Total Protein: 7.4 g/dL (ref 6.5–8.1)

## 2020-08-31 LAB — MAGNESIUM: Magnesium: 1.5 mg/dL — ABNORMAL LOW (ref 1.7–2.4)

## 2020-08-31 LAB — BILIRUBIN, DIRECT: Bilirubin, Direct: 0.4 mg/dL — ABNORMAL HIGH (ref 0.0–0.2)

## 2020-08-31 LAB — TSH: TSH: 0.674 u[IU]/mL (ref 0.350–4.500)

## 2020-08-31 MED ORDER — HEPARIN SOD (PORK) LOCK FLUSH 100 UNIT/ML IV SOLN
500.0000 [IU] | Freq: Once | INTRAVENOUS | Status: AC | PRN
  Administered 2020-08-31: 500 [IU]
  Filled 2020-08-31: qty 5

## 2020-08-31 MED ORDER — CARBOPLATIN CHEMO INJECTION 600 MG/60ML
597.0000 mg | Freq: Once | INTRAVENOUS | Status: AC
Start: 2020-08-31 — End: 2020-08-31
  Administered 2020-08-31: 600 mg via INTRAVENOUS
  Filled 2020-08-31: qty 60

## 2020-08-31 MED ORDER — FAMOTIDINE IN NACL 20-0.9 MG/50ML-% IV SOLN
20.0000 mg | Freq: Once | INTRAVENOUS | Status: AC
  Administered 2020-08-31: 20 mg via INTRAVENOUS
  Filled 2020-08-31: qty 50

## 2020-08-31 MED ORDER — SODIUM CHLORIDE 0.9 % IV SOLN
200.0000 mg | Freq: Once | INTRAVENOUS | Status: AC
  Administered 2020-08-31: 200 mg via INTRAVENOUS
  Filled 2020-08-31: qty 8

## 2020-08-31 MED ORDER — SODIUM CHLORIDE 0.9 % IV SOLN: 200.0000 mg/m2 | Freq: Once | INTRAVENOUS | Status: DC

## 2020-08-31 MED ORDER — HEPARIN SOD (PORK) LOCK FLUSH 100 UNIT/ML IV SOLN
INTRAVENOUS | Status: AC
  Filled 2020-08-31: qty 5

## 2020-08-31 MED ORDER — SODIUM CHLORIDE 0.9 % IV SOLN
10.0000 mg | Freq: Once | INTRAVENOUS | Status: AC
  Administered 2020-08-31: 10 mg via INTRAVENOUS
  Filled 2020-08-31: qty 10

## 2020-08-31 MED ORDER — SODIUM CHLORIDE 0.9 % IV SOLN
150.0000 mg | Freq: Once | INTRAVENOUS | Status: AC
  Administered 2020-08-31: 150 mg via INTRAVENOUS
  Filled 2020-08-31: qty 150

## 2020-08-31 MED ORDER — SODIUM CHLORIDE 0.9 % IV SOLN: 100.0000 mg | Freq: Once | INTRAVENOUS | Status: DC

## 2020-08-31 MED ORDER — PALONOSETRON HCL INJECTION 0.25 MG/5ML
0.2500 mg | Freq: Once | INTRAVENOUS | Status: AC
  Administered 2020-08-31: 0.25 mg via INTRAVENOUS
  Filled 2020-08-31: qty 5

## 2020-08-31 MED ORDER — KLOR-CON M20 20 MEQ PO TBCR: 40.0000 meq | EXTENDED_RELEASE_TABLET | Freq: Every day | ORAL | 1 refills | Status: DC

## 2020-08-31 MED ORDER — POTASSIUM CHLORIDE IN NACL 20-0.9 MEQ/L-% IV SOLN
Freq: Once | INTRAVENOUS | Status: AC
  Administered 2020-08-31: 1000 mL via INTRAVENOUS
  Filled 2020-08-31: qty 1000

## 2020-08-31 MED ORDER — DIPHENHYDRAMINE HCL 50 MG/ML IJ SOLN
50.0000 mg | Freq: Once | INTRAMUSCULAR | Status: AC
  Administered 2020-08-31: 50 mg via INTRAVENOUS
  Filled 2020-08-31: qty 1

## 2020-08-31 MED ORDER — SODIUM CHLORIDE 0.9 % IV SOLN
100.0000 mg/m2 | Freq: Once | INTRAVENOUS | Status: AC
  Administered 2020-08-31: 180 mg via INTRAVENOUS
  Filled 2020-08-31: qty 30

## 2020-08-31 MED ORDER — SODIUM CHLORIDE 0.9 % IV SOLN
Freq: Once | INTRAVENOUS | Status: AC
  Filled 2020-08-31: qty 250

## 2020-08-31 MED ORDER — MAGNESIUM SULFATE 2 GM/50ML IV SOLN
2.0000 g | Freq: Once | INTRAVENOUS | Status: AC
  Administered 2020-08-31: 2 g via INTRAVENOUS
  Filled 2020-08-31: qty 50

## 2020-08-31 NOTE — Progress Notes (Signed)
Hematology/Oncology Follow up note [facility] [phone] [phone]   Patient Care Team: Tracie Harrier, MD as PCP - General (Internal Medicine) Earlie Server, MD as Consulting Physician (Hematology and Oncology)  REASON FOR VISIT:  Follow up for treatment of squamous lung cancer.   HISTORY OF PRESENTING ILLNESS:  # Dec 2019 Stage I squamous lung cancer #s/p Bronchoscopy on 06/25/2018. subcarina EBUS FNA was non diagnostic, hypocellular specimen.  # 08/13/2018 CT guided right upper lobe biopsy pathology showed dense fibrosis and mixed inflammatory cells with prominent polytypic plasma cells component. Focal benign bronchial wall and alveolar spaces. No malignancy was identified.   # 08/13/2018 underwent right thoracotomy and wedge resection of a right upper lobe mass.  Frozen section was consistent with an inflammatory process.  On the second postop day, preliminary pathology reports high-grade malignancy.  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  08/20/2018 Patient was therefore taken back to the OR and underwent take complete lobectomy  pT1b pN0 cM0 stage I squamous cell lung cancer.  Margin is negative.  Recommend observation.    # 12/03/2018 CT chest w contrast showed local recurrence.  12/10/2018 PET showed No evidence of distant metastatic disease # 12/26/2018 s/p bronchoscopy biopsy. Confirmed local recurrence of squamous cell lung cancer.  medi port placed by Dr.Oaks.  # 06/14/2020 CT chest w contrast was reviewed and discussed with patient.  Evidence of progressive lymphangitic spread of tumor throughout the right lung, with contralateral lung nodules with  right pleural effusion  MRI brain is negative for CNS involvement.  CT abdomen with contrast showed no evidence of abdominal metastatic disease PET scan was not approved by insurance-peer to peer appeal with Dr.Vipul Bhanderi   # NGS - KRAS V14L,  TMB 26.6-high, MS stable, PD-L1  5% CDKN2C G48, DICER1c.2117-1G>T, FLT4 G1131S, NF1 A461f, NF1 L2643*, STK11 N1823fCancer treatment Started concurrent chemoradiation on 01/16/2019 Carboplatin AUC of 2 and Taxol 45 mg/m2 weekly finished in 03/05/2019 04/10/2019, patient started on durvalumab maintenance. -CT showed progression. 07/16/2020 started on carboplatin/paclitaxel/Keytruda  INTERVAL HISTORY Samuel K VALIN MASSIEs a [age] male who has above history reviewed by me today presents for reatment of  recurrent  squamous cell lung cancer. No nausea vomiting diarrhea.  He reports appetite has been good.  He has lost 2 pounds since last visit. Denies any shortness of breath or cough.  Review of Systems  Constitutional: Positive for fatigue. Negative for appetite change, chills, fever and unexpected weight change.  HENT:   Negative for hearing loss and voice change.   Eyes: Negative for eye problems and icterus.  Respiratory: Negative for chest tightness, cough and shortness of breath.   Cardiovascular: Negative for chest pain and leg swelling.  Gastrointestinal: Negative for abdominal distention, abdominal pain and nausea.  Endocrine: Negative for hot flashes.  Genitourinary: Negative for difficulty urinating, dysuria and frequency.   Musculoskeletal: Negative for arthralgias.  Skin: Negative for itching and rash.  Neurological: Negative for light-headedness and numbness.  Hematological: Negative for adenopathy. Does not bruise/bleed easily.  Psychiatric/Behavioral: Negative for confusion.    MEDICAL HISTORY:  Past Medical History:  Diagnosis Date  . Alcohol abuse    usually drinks 2-3 drinks per day  . Atherosclerosis 06/2018  . Chronic sinusitis   . Dehydration 02/07/2019  . Emphysema of lung ([place]   patient unaware of this.  . Hip fracture ([facility]/2019   no surgery  . History of kidney stones 05/2018   per xray, bilateral  nephrolitiasis  . Hypertension   . Squamous cell carcinoma of lung, right ([place])  06/2018    SURGICAL HISTORY: Past Surgical History:  Procedure Laterality Date  . BRAIN SURGERY  10/2017   nasal/sinus endoscopy. mass benign  . ELECTROMAGNETIC NAVIGATION BROCHOSCOPY Right 06/25/2018   Procedure: ELECTROMAGNETIC NAVIGATION BRONCHOSCOPY;  Surgeon: [name], MD;  Location: [facility] OR[facility];  Service: Cardiopulmonary;  Laterality: Right;  . ENDOBRONCHIAL ULTRASOUND Right 06/25/2018   Procedure: ENDOBRONCHIAL ULTRASOUND;  Surgeon: [name], MD;  Location: [facility] OR[facility];  Service: Cardiopulmonary;  Laterality: Right;  . ENDOBRONCHIAL ULTRASOUND Right 12/26/2018   Procedure: ENDOBRONCHIAL ULTRASOUND RIGHT;  Surgeon: [name], MD;  Location: [facility] OR[facility];  Service: Cardiopulmonary;  Laterality: Right;  . FLEXIBLE BRONCHOSCOPY N/A 08/20/2018   Procedure: FLEXIBLE BRONCHOSCOPY PREOP;  Surgeon: [name], MD;  Location: [facility] OR[facility];  Service: Thoracic;  Laterality: N/A;  . IR CV LINE INJECTION  04/04/2019  . NASAL SINUS SURGERY  10/2017   At [facility], frontal sinusotomy, ethmoidectomy, resection anterior cranial fossa neoplasm, turbinate resection  . PORTACATH PLACEMENT Left 01/15/2019   Procedure: INSERTION PORT-A-CATH;  Surgeon: [name], MD;  Location: [facility] OR[facility];  Service: General;  Laterality: Left;  . THORACOTOMY Right 08/13/2018   Procedure: PREOP BROCHOSCOPY WITH RIGHT THORACOTOMY AND RUL RESECTION;  Surgeon: [name], MD;  Location: [facility];  Service: General;  Laterality: Right;  . THORACOTOMY Right 08/20/2018   Procedure: THORACOTOMY MAJOR RIGHT UPPER LOBE LOBECTOMY;  Surgeon: [name], MD;  Location: [facility];  Service: Thoracic;  Laterality: Right;  . TOE SURGERY Left    pin in left toe    SOCIAL HISTORY: Social History   Socioeconomic History  . Marital status: Married    Spouse name: lisa  . Number of children: Not on file  . Years of education: Not on file  . Highest education level: Not on file  Occupational History  . Occupation: welding    Comment:  taking time off to resolve issues  Tobacco Use  . Smoking status: Former Smoker    Packs/day: 0.50    Years: 15.00    Pack years: 7.50    Types: Cigarettes    Quit date: 08/2018    Years since quitting: 2.0  . Smokeless tobacco: Never Used  Vaping Use  . Vaping Use: Never used  Substance and Sexual Activity  . Alcohol use: Yes    Alcohol/week: 3.0 standard drinks    Types: 3 Cans of beer per week    Comment: usually 2 drinks per week, per patient  . Drug use: No  . Sexual activity: Not on file  Other Topics Concern  . Not on file  Social History Narrative  . Not on file   Social Determinants of Health   Financial Resource Strain: Not on file  Food Insecurity: Not on file  Transportation Needs: Not on file  Physical Activity: Not on file  Stress: Not on file  Social Connections: Not on file  Intimate Partner Violence: Not on file    FAMILY HISTORY: Family History  Problem Relation Age of Onset  . Breast cancer Mother   . Diabetes Mother   . Lung cancer Father   . Hypertension Father     ALLERGIES:  has No Known Allergies.  MEDICATIONS:  Current Outpatient Medications  Medication Sig Dispense Refill  . albuterol (VENTOLIN HFA) 108 (90 Base) MCG/ACT inhaler TAKE 2 PUFFS BY MOUTH EVERY 6 HOURS AS NEEDED FOR WHEEZE OR SHORTNESS OF BREATH 6.7 each 6  .  cloNIDine (CATAPRES) 0.1 MG tablet Take 0.1 mg by mouth daily.   5  . diltiazem (CARDIZEM CD) 120 MG 24 hr capsule Take 120 mg by mouth daily.    . folic acid (V-R FOLIC ACID) 638 MCG tablet Take 1 tablet (400 mcg total) by mouth daily. 90 tablet 1  . hydrALAZINE (APRESOLINE) 100 MG tablet Take 50 mg by mouth 2 (two) times daily.     . hydrochlorothiazide (HYDRODIURIL) 12.5 MG tablet Take 12.5 mg by mouth daily.     Marland Kitchen ibuprofen (ADVIL) 600 MG tablet Take 600 mg by mouth every 6 (six) hours.    Marland Kitchen KLOR-CON M20 20 MEQ tablet Take 1 tablet (20 mEq total) by mouth daily. 90 tablet 0  . latanoprost (XALATAN) 0.005 %  ophthalmic solution 1 drop at bedtime.    . lidocaine-prilocaine (EMLA) cream Apply to affected area once 30 g 3  . magnesium chloride (SLOW-MAG) 64 MG TBEC SR tablet Take 1 tablet (64 mg total) by mouth 2 (two) times daily. TAKE 1 TABLET (64 MG TOTAL) BY MOUTH DAILY. 60 tablet 1  . methocarbamol (ROBAXIN) 500 MG tablet Take 1 tablet (500 mg total) by mouth at bedtime. 30 tablet 0  . olmesartan (BENICAR) 40 MG tablet Take 40 mg by mouth every other day.     . ondansetron (ZOFRAN) 8 MG tablet Take 1 tablet (8 mg total) by mouth every 8 (eight) hours as needed for refractory nausea / vomiting. Start on day 3 after chemo. 90 tablet 3  . prochlorperazine (COMPAZINE) 10 MG tablet Take 1 tablet (10 mg total) by mouth every 6 (six) hours as needed (Nausea or vomiting). 90 tablet 3  . SPIRIVA HANDIHALER 18 MCG inhalation capsule INHALE 1 CAPSULE VIA HANDIHALER ONCE DAILY AT THE SAME TIME EVERY DAY 30 capsule 0  . SYMBICORT 80-4.5 MCG/ACT inhaler INHALE 2 PUFFS INTO THE LUNGS IN THE MORNING AND AT BEDTIME. 10.2 each 6  . vitamin B-12 (CYANOCOBALAMIN) 1000 MCG tablet Take 1 tablet (1,000 mcg total) by mouth daily. 90 tablet 1   No current facility-administered medications for this visit.   Facility-Administered Medications Ordered in Other Visits  Medication Dose Route Frequency Provider Last Rate Last Admin  . albuterol (PROVENTIL) (2.5 MG/3ML) 0.083% nebulizer solution 2.5 mg  2.5 mg Nebulization Once System, Provider Not In      . heparin lock flush 100 unit/mL  500 Units Intravenous Once Earlie Server, MD      . sodium chloride flush (NS) 0.9 % injection 10 mL  10 mL Intravenous PRN Earlie Server, MD   10 mL at 04/04/19 0903  . sodium chloride flush (NS) 0.9 % injection 10 mL  10 mL Intravenous PRN Earlie Server, MD   10 mL at 07/26/20 1308     PHYSICAL EXAMINATION: ECOG PERFORMANCE STATUS: 1 - Symptomatic but completely ambulatory Vitals:   08/31/20 0850  BP: (!) 171/86  Pulse: 98  Resp: 16  Temp: 98.2 F  (36.8 C)   Filed Weights   08/31/20 0850  Weight: 139 lb 4.8 oz (63.2 kg)    Physical Exam Constitutional:      General: He is not in acute distress. HENT:     Head: Normocephalic and atraumatic.  Eyes:     General: No scleral icterus. Cardiovascular:     Rate and Rhythm: Normal rate and regular rhythm.     Heart sounds: Normal heart sounds.  Pulmonary:     Effort: Pulmonary effort is normal. No respiratory  distress.     Breath sounds: No wheezing.  Abdominal:     General: Bowel sounds are normal. There is no distension.     Palpations: Abdomen is soft.  Musculoskeletal:        General: No deformity. Normal range of motion.     Cervical back: Normal range of motion and neck supple.  Skin:    General: Skin is warm and dry.     Findings: No erythema or rash.  Neurological:     Mental Status: He is alert and oriented to person, place, and time. Mental status is at baseline.     Cranial Nerves: No cranial nerve deficit.     Coordination: Coordination normal.  Psychiatric:        Mood and Affect: Mood normal.      LABORATORY DATA:  I have reviewed the data as listed Lab Results  Component Value Date   WBC 6.8 08/10/2020   HGB 12.9 (L) 08/10/2020   HCT 36.4 (L) 08/10/2020   MCV 98.6 08/10/2020   PLT 142 (L) 08/10/2020   Recent Labs    03/17/20 0830 03/31/20 0825 04/07/20 0854 04/22/20 0825 07/26/20 1308 08/02/20 1258 08/10/20 0803  NA 134* 137 137   < > 133* 139 138  K 4.6 3.9 3.7   < > 3.6 3.2* 3.4*  CL 101 102 104   < > 96* 100 101  CO2 20* 23 21*   < > _0 GLUCOSE 167* 126* 163*   < > 94 123* 103*  BUN _1 < > 21* 14 10  CREATININE 1.19 1.10 1.10   < > 1.06 1.13 0.97  CALCIUM 9.0 9.1 8.8*   < > 8.7* 8.8* 9.1  GFRNONAA >60 >60 >60   < > >60 >60 >60  GFRAA >60 >60 >60  --   --   --   --   PROT 7.7 7.6 7.1   < > 7.1 7.0 7.3  ALBUMIN 3.5 3.5 3.4*   < > 3.6 3.3* 3.4*  AST 32 29 25   < > _2 ALT _3 < > _4 ALKPHOS 86 87  76   < > 71 94 91  BILITOT 0.8 0.7 0.5   < > 0.6 0.5 1.2   < > = values in this interval not displayed.   Iron/TIBC/Ferritin/ %Sat No results found for: IRON, TIBC, FERRITIN, IRONPCTSAT   RADIOGRAPHIC STUDIES: I have personally reviewed the radiological images as listed and agreed with the findings in the report. CT Chest W Contrast  Result Date: 06/14/2020 CLINICAL DATA:  55 year old male with history of lung cancer. Follow-up study. EXAM: CT CHEST WITH CONTRAST TECHNIQUE: Multidetector CT imaging of the chest was performed during intravenous contrast administration. CONTRAST:  90m OMNIPAQUE IOHEXOL 300 MG/ML  SOLN COMPARISON:  Chest CT 04/21/2020. FINDINGS: Cardiovascular: Heart size is normal. Small amount of pericardial fluid and/or thickening, increased compared to the prior study, but unlikely to be of hemodynamic significance at this time. No associated pericardial calcification. There is aortic atherosclerosis, as well as atherosclerosis of the great vessels of the mediastinum and the coronary arteries, including calcified atherosclerotic plaque in the left anterior descending coronary artery. Left-sided subclavian single-lumen porta cath with tip terminating in the distal superior vena cava. Mediastinum/Nodes: No pathologically enlarged mediastinal or hilar lymph nodes. Esophagus is unremarkable in appearance. No axillary lymphadenopathy. Lungs/Pleura: Status post right upper lobectomy. Compensatory  hyperexpansion of the right middle and lower lobes. Increasing areas of nodular septal thickening throughout the right lung, most evident in the right middle lobe where the largest of these nodules measures up to 11 x 8 mm (axial image 62 of series 3). Several other scattered tiny 2-5 mm pulmonary nodules are also noted in the left lung. Small right pleural effusion with pleural enhancement Upper Abdomen: Multiple tiny 2-3 mm nonobstructive calculi in the collecting systems of both kidneys. Aortic  atherosclerosis. Aortic atherosclerosis. Musculoskeletal: There are no aggressive appearing lytic or blastic lesions noted in the visualized portions of the skeleton. IMPRESSION: 1. Evidence of progressive lymphangitic spread of tumor throughout the right lung, with additional pulmonary nodules in the contralateral lung suspicious for potential hematogenous metastasis. In addition, there is a small right pleural effusion with areas of pleural enhancement, which could indicate a developing malignant pleural effusion. 2. Multiple nonobstructive calculi in the collecting systems of both kidneys. 3. Aortic atherosclerosis, in addition to left anterior descending coronary artery disease. Please note that although the presence of coronary artery calcium documents the presence of coronary artery disease, the severity of this disease and any potential stenosis cannot be assessed on this non-gated CT examination. Assessment for potential risk factor modification, dietary therapy or pharmacologic therapy may be warranted, if clinically indicated. Aortic Atherosclerosis (ICD10-I70.0). Electronically Signed   By: Vinnie Langton M.D.   On: 06/14/2020 10:41   CT Abdomen W Contrast  Result Date: 07/06/2020 CLINICAL DATA:  Right lung squamous cell carcinoma.  Staging. EXAM: CT ABDOMEN WITH CONTRAST TECHNIQUE: Multidetector CT imaging of the abdomen was performed using the standard protocol following bolus administration of intravenous contrast. CONTRAST:  165m OMNIPAQUE IOHEXOL 300 MG/ML  SOLN COMPARISON:  [facility] FINDINGS: Lower chest: Tiny right pleural effusion, as seen on recent chest CT. Hepatobiliary: No hepatic masses identified. Gallbladder is unremarkable. No evidence of biliary ductal dilatation. Pancreas:  No mass or inflammatory changes. Spleen:  Within normal limits in size and appearance. Adrenals/Urinary Tract: Normal adrenal glands. Mild right renal parenchymal  scarring. Stable tiny cyst in lower pole of left kidney. No masses identified. A few tiny 1-2 mm renal calculi are again seen bilaterally, however there is no evidence of hydronephrosis. Stomach/Bowel: Visualized portion unremarkable. Vascular/Lymphatic: No pathologically enlarged lymph nodes identified. No abdominal aortic aneurysm. Aortic atherosclerotic calcification noted. Other:  None. Musculoskeletal:  No suspicious bone lesions identified. IMPRESSION: No evidence of abdominal metastatic disease or other acute findings. Bilateral nephrolithiasis. No evidence of hydronephrosis. Electronically Signed   By: JMarlaine HindM.D.   On: 07/06/2020 15:49   MR Brain W Wo Contrast  Result Date: 07/01/2020 CLINICAL DATA:  Lung cancer patient.  Yearly follow-up. EXAM: MRI HEAD WITHOUT AND WITH CONTRAST TECHNIQUE: Multiplanar, multiecho pulse sequences of the brain and surrounding structures were obtained without and with intravenous contrast. CONTRAST:  634mGADAVIST GADOBUTROL 1 MMOL/ML IV SOLN COMPARISON:  01/20/2019 FINDINGS: Brain: Mild generalized volume loss. Diffusion imaging does not show any acute or subacute infarction or other cause of restricted diffusion. Mild chronic small-vessel change affects the pons. No focal cerebellar finding. Cerebral hemispheres show mild to moderate chronic small-vessel change of the white matter, advanced for age. No cortical or large vessel territory infarction. No mass lesion, hemorrhage, hydrocephalus or extra-axial collection. After contrast administration, no abnormal enhancement occurs. Vascular: Major vessels at the base of the brain show flow. Skull and upper cervical spine: Negative Sinuses/Orbits: Clear sinuses presently. Previous functional endoscopic  sinus surgery. No acute orbital finding. Dysconjugate gaze. Other: None IMPRESSION: 1. No evidence of metastatic disease. 2. Brain atrophy and chronic small-vessel change of the pons and cerebral hemispheric white matter,  advanced for age. Electronically Signed   By: Nelson Chimes M.D.   On: 07/01/2020 08:55     ASSESSMENT & PLAN:  1. Squamous cell carcinoma of lung, right ([place])   2. Encounter for antineoplastic chemotherapy   3. Chemotherapy-induced nausea   4. Hypokalemia   5. Encounter for antineoplastic immunotherapy    #Recurrent Squamous cell carcinoma of lung, stage IV On palliative chemotherapy with carboplatin Taxol and Keytruda.  Overall he tolerates treatments. Labs are reviewed and discussed with patient .  Proceed with cycle 3 carboplatin/Taxol/Keytruda Taxol dose reduced to 100 mg/m2 due to hyperbilirubinemia.  Pending additional work-up  #Hyperbilirubinemia, bilirubin stable.  Check direct bilirubin. #Chemotherapy-induced nausea, resolved.  Antiemetics as needed. #Chronic hypomagnesia. Continue  slow magnesium twice daily.  Check magnesium level today. #Chronic hypokalemia, continue potassium chloride 20 mEq twice daily.  Patient will receive IV normal saline 1 L +20 meq potassium chloride IV +2 g of magnesium sulfate today.. # Thrombocytopenia, due to chemo.  Close monitor.  Return of visit:  3 weeks for evaluation of next cycle of treatment  Earlie Server, MD, PhD Hematology [place] 08/31/2020

## 2020-08-31 NOTE — Progress Notes (Signed)
Total Bilirubin: 2.0 today. MD, Dr. Tasia Catchings, notified and aware. Per MD order: proceed with scheduled Keytruda, Taxol, and Carboplatin treatment today.

## 2020-08-31 NOTE — Progress Notes (Signed)
Patient has lost 9 lbs despite having a good appetite.

## 2020-08-31 NOTE — Progress Notes (Signed)
Clarified with MD.  [name] dose stay same, taxol dose decreased to 100mg /m2.

## 2020-09-01 ENCOUNTER — Ambulatory Visit
Admission: RE | Admit: 2020-09-01 | Discharge: 2020-09-01 | Disposition: A | Discharge status: Home / Self Care | Payer: 59 | Source: Ambulatory Visit | Attending: Radiation Oncology | Admitting: Radiation Oncology

## 2020-09-01 ENCOUNTER — Encounter: Payer: Self-pay | Admitting: Radiation Oncology

## 2020-09-01 VITALS — BP 134/76 | HR 108 | Temp 96.0°F | Wt 144.0 lb

## 2020-09-01 DIAGNOSIS — C3411 Malignant neoplasm of upper lobe, right bronchus or lung: Secondary | ICD-10-CM

## 2020-09-01 NOTE — Progress Notes (Signed)
Radiation Oncology Follow up Note  Name: Samuel Choi   Date:   09/01/2020 MRN:  [id number] DOB: 06/06/1966    This 55 y.o. male presents to the clinic today for 28-month follow-up status post concurrent chemoradiation therapy for squamous cell carcinoma the right upper lobe initial stage I (T1b N0 M0 he developed hilar recurrence and treated for that now with stage IV metastatic disease.Marland Kitchen  REFERRING PROVIDER: Tracie Harrier, MD  HPI: Patient is a 55 year old male now at 17 months having completed concurrent chemoradiation therapy for a hilar recurrence status post right upper lobectomy for stage I squamous cell carcinoma..  He now has CT evidence of progressive lymphangitic spread of tumor throughout the right lung with contralateral lung nodules right pleural effusion.  He is currently oncarboplatin/paclitaxel/Keytruda which she is tolerating well.  He specifically denies any areas of pain significant cough hemoptysis or chest tightness.  COMPLICATIONS OF TREATMENT: none  FOLLOW UP COMPLIANCE: keeps appointments   PHYSICAL EXAM:  BP 134/76   Pulse (!) 108   Temp (!) 96 F (35.6 C) (Tympanic)   Wt 144 lb (65.3 kg)   BMI 19.00 kg/m  Thin slightly cachectic male in NAD.  Well-developed well-nourished patient in NAD. HEENT reveals PERLA, EOMI, discs not visualized.  Oral cavity is clear. No oral mucosal lesions are identified. Neck is clear without evidence of cervical or supraclavicular adenopathy. Lungs are clear to A&P. Cardiac examination is essentially unremarkable with regular rate and rhythm without murmur rub or thrill. Abdomen is benign with no organomegaly or masses noted. Motor sensory and DTR levels are equal and symmetric in the upper and lower extremities. Cranial nerves II through XII are grossly intact. Proprioception is intact. No peripheral adenopathy or edema is identified. No motor or sensory levels are noted. Crude visual fields are within normal range.  RADIOLOGY  RESULTS: CT scans reviewed compatible with above-stated findings  PLAN: At the present time patient is currently on salvage immunotherapy as well as chemotherapy.  He is tolerating that well.  I see no benefit for any palliative radiation at this time.  I have asked to see him back in 6 months for follow-up.  I be happy to reevaluate patient anytime should further palliative treatment be indicated.  Patient knows to call with any concerns.  I would like to take this opportunity to thank you for allowing me to participate in the care of your patient.Noreene Filbert, MD

## 2020-09-07 ENCOUNTER — Inpatient Hospital Stay: Payer: 59 | Attending: Oncology

## 2020-09-07 DIAGNOSIS — Z79899 Other long term (current) drug therapy: Secondary | ICD-10-CM | POA: Diagnosis not present

## 2020-09-07 DIAGNOSIS — Z5111 Encounter for antineoplastic chemotherapy: Secondary | ICD-10-CM | POA: Diagnosis not present

## 2020-09-07 DIAGNOSIS — I10 Essential (primary) hypertension: Secondary | ICD-10-CM | POA: Diagnosis not present

## 2020-09-07 DIAGNOSIS — T451X5A Adverse effect of antineoplastic and immunosuppressive drugs, initial encounter: Secondary | ICD-10-CM | POA: Diagnosis not present

## 2020-09-07 DIAGNOSIS — D701 Agranulocytosis secondary to cancer chemotherapy: Secondary | ICD-10-CM | POA: Diagnosis not present

## 2020-09-07 DIAGNOSIS — Z7951 Long term (current) use of inhaled steroids: Secondary | ICD-10-CM | POA: Insufficient documentation

## 2020-09-07 DIAGNOSIS — Z803 Family history of malignant neoplasm of breast: Secondary | ICD-10-CM | POA: Diagnosis not present

## 2020-09-07 DIAGNOSIS — Z8249 Family history of ischemic heart disease and other diseases of the circulatory system: Secondary | ICD-10-CM | POA: Diagnosis not present

## 2020-09-07 DIAGNOSIS — Z801 Family history of malignant neoplasm of trachea, bronchus and lung: Secondary | ICD-10-CM | POA: Diagnosis not present

## 2020-09-07 DIAGNOSIS — Z5189 Encounter for other specified aftercare: Secondary | ICD-10-CM | POA: Diagnosis not present

## 2020-09-07 DIAGNOSIS — R059 Cough, unspecified: Secondary | ICD-10-CM

## 2020-09-07 DIAGNOSIS — Z87891 Personal history of nicotine dependence: Secondary | ICD-10-CM | POA: Insufficient documentation

## 2020-09-07 DIAGNOSIS — C3411 Malignant neoplasm of upper lobe, right bronchus or lung: Secondary | ICD-10-CM | POA: Diagnosis present

## 2020-09-07 DIAGNOSIS — E876 Hypokalemia: Secondary | ICD-10-CM | POA: Diagnosis not present

## 2020-09-07 DIAGNOSIS — C3491 Malignant neoplasm of unspecified part of right bronchus or lung: Secondary | ICD-10-CM

## 2020-09-07 DIAGNOSIS — Z833 Family history of diabetes mellitus: Secondary | ICD-10-CM | POA: Insufficient documentation

## 2020-09-07 LAB — CBC WITH DIFFERENTIAL/PLATELET
Abs Immature Granulocytes: 0.04 10*3/uL (ref 0.00–0.07)
Basophils Absolute: 0.1 10*3/uL (ref 0.0–0.1)
Basophils Relative: 2 %
Eosinophils Absolute: 0.2 10*3/uL (ref 0.0–0.5)
Eosinophils Relative: 4 %
HCT: 29.1 % — ABNORMAL LOW (ref 39.0–52.0)
Hemoglobin: 10.2 g/dL — ABNORMAL LOW (ref 13.0–17.0)
Immature Granulocytes: 1 %
Lymphocytes Relative: 21 %
Lymphs Abs: 1.2 10*3/uL (ref 0.7–4.0)
MCH: 37.8 pg — ABNORMAL HIGH (ref 26.0–34.0)
MCHC: 35.1 g/dL (ref 30.0–36.0)
MCV: 107.8 fL — ABNORMAL HIGH (ref 80.0–100.0)
Monocytes Absolute: 0.2 10*3/uL (ref 0.1–1.0)
Monocytes Relative: 3 %
Neutro Abs: 3.9 10*3/uL (ref 1.7–7.7)
Neutrophils Relative %: 69 %
Platelets: 30 10*3/uL — ABNORMAL LOW (ref 150–400)
RBC: 2.7 MIL/uL — ABNORMAL LOW (ref 4.22–5.81)
RDW: 16.8 % — ABNORMAL HIGH (ref 11.5–15.5)
WBC: 5.6 10*3/uL (ref 4.0–10.5)
nRBC: 0 % (ref 0.0–0.2)

## 2020-09-07 LAB — COMPREHENSIVE METABOLIC PANEL
ALT: 15 U/L (ref 0–44)
AST: 22 U/L (ref 15–41)
Albumin: 3.8 g/dL (ref 3.5–5.0)
Alkaline Phosphatase: 78 U/L (ref 38–126)
Anion gap: 15 (ref 5–15)
BUN: 19 mg/dL (ref 6–20)
CO2: 27 mmol/L (ref 22–32)
Calcium: 9.4 mg/dL (ref 8.9–10.3)
Chloride: 98 mmol/L (ref 98–111)
Creatinine, Ser: 0.88 mg/dL (ref 0.61–1.24)
GFR, Estimated: >60 mL/min (ref >60)
Glucose, Bld: 103 mg/dL — ABNORMAL HIGH (ref 70–99)
Potassium: 4 mmol/L (ref 3.5–5.1)
Sodium: 140 mmol/L (ref 135–145)
Total Bilirubin: 0.8 mg/dL (ref 0.3–1.2)
Total Protein: 7.6 g/dL (ref 6.5–8.1)

## 2020-09-08 ENCOUNTER — Other Ambulatory Visit: Payer: Self-pay

## 2020-09-08 ENCOUNTER — Telehealth: Payer: Self-pay

## 2020-09-08 DIAGNOSIS — C3491 Malignant neoplasm of unspecified part of right bronchus or lung: Secondary | ICD-10-CM

## 2020-09-08 LAB — IMMATURE PLATELET FRACTION: Immature Platelet Fraction: 15.6 % — ABNORMAL HIGH (ref 1.2–8.6)

## 2020-09-08 NOTE — Telephone Encounter (Signed)
Correction, pt will RTC on 09/13/20 for Lab only per MD

## 2020-09-08 NOTE — Telephone Encounter (Signed)
Patient notified via MyChart message.  Lab has added the immature platelet fraction.  Please schedule lab only as MD recommends.

## 2020-09-08 NOTE — Telephone Encounter (Signed)
-----   Message from Earlie Server, MD sent at 09/07/2020  8:34 PM EST ----- Please add immature platelet fraction to 09/07/20 CBC thanks.  Let pt know that platelet is low. Ask if any bleeding or bruising. Avoid NSAIDs.  Arrange him to repeat lab around 3/7, cbc hold tube

## 2020-09-08 NOTE — Telephone Encounter (Signed)
Done  Pt will RTC on 09/09/20 for Lab only as requested.

## 2020-09-09 ENCOUNTER — Other Ambulatory Visit: Payer: 59

## 2020-09-13 ENCOUNTER — Other Ambulatory Visit: Payer: Self-pay

## 2020-09-13 ENCOUNTER — Other Ambulatory Visit: Payer: Self-pay | Admitting: Oncology

## 2020-09-13 ENCOUNTER — Inpatient Hospital Stay: Payer: 59

## 2020-09-13 DIAGNOSIS — Z5111 Encounter for antineoplastic chemotherapy: Secondary | ICD-10-CM | POA: Diagnosis not present

## 2020-09-13 DIAGNOSIS — C3491 Malignant neoplasm of unspecified part of right bronchus or lung: Secondary | ICD-10-CM

## 2020-09-13 LAB — SAMPLE TO BLOOD BANK

## 2020-09-13 LAB — CBC WITH DIFFERENTIAL/PLATELET
Basophils Absolute: 0.3 10*3/uL — ABNORMAL HIGH (ref 0.0–0.1)
Basophils Relative: 2 %
Eosinophils Absolute: 0.1 10*3/uL (ref 0.0–0.5)
Eosinophils Relative: 8 %
HCT: 27.6 % — ABNORMAL LOW (ref 39.0–52.0)
Hemoglobin: 9.5 g/dL — ABNORMAL LOW (ref 13.0–17.0)
Lymphocytes Relative: 44 %
Lymphs Abs: 0.8 10*3/uL (ref 0.7–4.0)
MCH: 38.5 pg — ABNORMAL HIGH (ref 26.0–34.0)
MCHC: 34.4 g/dL (ref 30.0–36.0)
MCV: 111.7 fL — ABNORMAL HIGH (ref 80.0–100.0)
Monocytes Absolute: 0.4 10*3/uL (ref 0.1–1.0)
Monocytes Relative: 24 %
Neutro Abs: 0.4 10*3/uL — CL (ref 1.7–7.7)
Neutrophils Relative %: 22 %
Platelets: 27 10*3/uL — CL (ref 150–400)
RBC: 2.47 MIL/uL — ABNORMAL LOW (ref 4.22–5.81)
RDW: 18 % — ABNORMAL HIGH (ref 11.5–15.5)
Smear Review: DECREASED
WBC: 1.8 10*3/uL — ABNORMAL LOW (ref 4.0–10.5)

## 2020-09-14 ENCOUNTER — Other Ambulatory Visit: Payer: Self-pay | Admitting: Oncology

## 2020-09-14 ENCOUNTER — Telehealth: Payer: Self-pay

## 2020-09-14 ENCOUNTER — Telehealth: Payer: Self-pay | Admitting: Oncology

## 2020-09-14 NOTE — Telephone Encounter (Signed)
-----   Message from Earlie Server, MD sent at 09/13/2020 10:30 PM EST ----- Neutropenia, let him know about neutropenia precaution.  Please arrange him to receive granix daily x 3 days ASAP.  Also let him know platelet counts are also low, if bleeding, he needs to seek medical advise immediately. Avoid NSAIDS, blood thinner.   Thanks.

## 2020-09-14 NOTE — Telephone Encounter (Signed)
Waiting for insurance authorization.

## 2020-09-14 NOTE — Telephone Encounter (Signed)
Patient is notified of results.  Will get him scheduled when authorization obtained.

## 2020-09-14 NOTE — Telephone Encounter (Signed)
Zarxio has been approved and scheduling will contact him to schedule.  Dr. Tasia Catchings has added order for long acting Ziextenzo. with his Day 3 treatment of next cycle.  He will need to have completed the 3 day Zarxio before ins auth can be obtained.  MD would like to move his 09/22/19 appts to 3/21.

## 2020-09-14 NOTE — Telephone Encounter (Signed)
-----   Message from [place] sent at 09/14/2020  1:36 PM EST ----- Regarding: Zarxio There are several things you need to know: 1)  Zarxio could not be approved at nurse level and has gone to physicial review. 2)  I have supplied them your cell phone number for peer-to-peer. 3) If approved they will allow [place] to do this for 30 days in office.  Otherwise, drug will have to be set up through you all to get through specialty pharmacy. I think we are using [facility].    If there are any questions, I spoke with [name] at [facility] and phone number is [phone] extension [phone].  Thanks, Lu

## 2020-09-14 NOTE — Telephone Encounter (Signed)
Pts wife called and stated the plts are low. They got the results from mychart and wanted to know if anything needs to be done with this issue.

## 2020-09-14 NOTE — Telephone Encounter (Addendum)
I have spoken to patient  his morning and is documented in phone note.

## 2020-09-14 NOTE — Telephone Encounter (Signed)
Injection changed to Zarxio (ins preferred) and could take up to 24 hours for approval.  MyChart message sent to patient informing him of the change and ins auth process.

## 2020-09-14 NOTE — Telephone Encounter (Signed)
MD has obtained approval on peer to peer.

## 2020-09-15 ENCOUNTER — Inpatient Hospital Stay: Payer: 59

## 2020-09-15 ENCOUNTER — Other Ambulatory Visit: Payer: Self-pay

## 2020-09-15 DIAGNOSIS — Z5111 Encounter for antineoplastic chemotherapy: Secondary | ICD-10-CM | POA: Diagnosis not present

## 2020-09-15 DIAGNOSIS — E860 Dehydration: Secondary | ICD-10-CM

## 2020-09-15 MED ORDER — FILGRASTIM-SNDZ 480 MCG/0.8ML IJ SOSY
480.0000 ug | PREFILLED_SYRINGE | Freq: Once | INTRAMUSCULAR | Status: AC
  Administered 2020-09-15: 480 ug via SUBCUTANEOUS
  Filled 2020-09-15: qty 0.8

## 2020-09-16 ENCOUNTER — Inpatient Hospital Stay: Payer: 59

## 2020-09-16 DIAGNOSIS — E860 Dehydration: Secondary | ICD-10-CM

## 2020-09-16 DIAGNOSIS — Z5111 Encounter for antineoplastic chemotherapy: Secondary | ICD-10-CM | POA: Diagnosis not present

## 2020-09-16 MED ORDER — FILGRASTIM-SNDZ 480 MCG/0.8ML IJ SOSY
480.0000 ug | PREFILLED_SYRINGE | Freq: Once | INTRAMUSCULAR | Status: AC
  Administered 2020-09-16: 480 ug via SUBCUTANEOUS
  Filled 2020-09-16: qty 0.8

## 2020-09-17 ENCOUNTER — Inpatient Hospital Stay: Payer: 59

## 2020-09-17 DIAGNOSIS — Z5111 Encounter for antineoplastic chemotherapy: Secondary | ICD-10-CM | POA: Diagnosis not present

## 2020-09-17 DIAGNOSIS — E860 Dehydration: Secondary | ICD-10-CM

## 2020-09-17 MED ORDER — FILGRASTIM-SNDZ 480 MCG/0.8ML IJ SOSY
480.0000 ug | PREFILLED_SYRINGE | Freq: Once | INTRAMUSCULAR | Status: AC
  Administered 2020-09-17: 480 ug via SUBCUTANEOUS
  Filled 2020-09-17: qty 0.8

## 2020-09-21 ENCOUNTER — Inpatient Hospital Stay: Payer: 59

## 2020-09-21 ENCOUNTER — Encounter: Payer: Self-pay | Admitting: Oncology

## 2020-09-21 ENCOUNTER — Inpatient Hospital Stay (HOSPITAL_BASED_OUTPATIENT_CLINIC_OR_DEPARTMENT_OTHER): Payer: 59 | Admitting: Oncology

## 2020-09-21 VITALS — BP 148/77 | HR 90 | Temp 97.1°F | Resp 18 | Wt 139.7 lb

## 2020-09-21 DIAGNOSIS — C3491 Malignant neoplasm of unspecified part of right bronchus or lung: Secondary | ICD-10-CM

## 2020-09-21 DIAGNOSIS — Z5111 Encounter for antineoplastic chemotherapy: Secondary | ICD-10-CM

## 2020-09-21 DIAGNOSIS — E876 Hypokalemia: Secondary | ICD-10-CM

## 2020-09-21 DIAGNOSIS — Z5112 Encounter for antineoplastic immunotherapy: Secondary | ICD-10-CM | POA: Diagnosis not present

## 2020-09-21 DIAGNOSIS — B370 Candidal stomatitis: Secondary | ICD-10-CM

## 2020-09-21 DIAGNOSIS — R059 Cough, unspecified: Secondary | ICD-10-CM

## 2020-09-21 DIAGNOSIS — T451X5A Adverse effect of antineoplastic and immunosuppressive drugs, initial encounter: Secondary | ICD-10-CM

## 2020-09-21 DIAGNOSIS — D701 Agranulocytosis secondary to cancer chemotherapy: Secondary | ICD-10-CM

## 2020-09-21 LAB — CBC WITH DIFFERENTIAL/PLATELET
Abs Immature Granulocytes: 0.4 10*3/uL — ABNORMAL HIGH (ref 0.00–0.07)
Band Neutrophils: 4 %
Basophils Absolute: 0.2 10*3/uL — ABNORMAL HIGH (ref 0.0–0.1)
Basophils Relative: 2 %
Eosinophils Absolute: 0 10*3/uL (ref 0.0–0.5)
Eosinophils Relative: 0 %
HCT: 30.3 % — ABNORMAL LOW (ref 39.0–52.0)
Hemoglobin: 10.6 g/dL — ABNORMAL LOW (ref 13.0–17.0)
Lymphocytes Relative: 16 %
Lymphs Abs: 1.5 10*3/uL (ref 0.7–4.0)
MCH: 39.6 pg — ABNORMAL HIGH (ref 26.0–34.0)
MCHC: 35 g/dL (ref 30.0–36.0)
MCV: 113.1 fL — ABNORMAL HIGH (ref 80.0–100.0)
Metamyelocytes Relative: 1 %
Monocytes Absolute: 1.2 10*3/uL — ABNORMAL HIGH (ref 0.1–1.0)
Monocytes Relative: 13 %
Myelocytes: 3 %
Neutro Abs: 6 10*3/uL (ref 1.7–7.7)
Neutrophils Relative %: 61 %
Platelets: 139 10*3/uL — ABNORMAL LOW (ref 150–400)
RBC: 2.68 MIL/uL — ABNORMAL LOW (ref 4.22–5.81)
RDW: 19.6 % — ABNORMAL HIGH (ref 11.5–15.5)
Smear Review: ADEQUATE
WBC: 9.2 10*3/uL (ref 4.0–10.5)
nRBC: 1.3 % — ABNORMAL HIGH (ref 0.0–0.2)

## 2020-09-21 LAB — COMPREHENSIVE METABOLIC PANEL
ALT: 14 U/L (ref 0–44)
AST: 23 U/L (ref 15–41)
Albumin: 3.4 g/dL — ABNORMAL LOW (ref 3.5–5.0)
Alkaline Phosphatase: 98 U/L (ref 38–126)
Anion gap: 14 (ref 5–15)
BUN: 8 mg/dL (ref 6–20)
CO2: 21 mmol/L — ABNORMAL LOW (ref 22–32)
Calcium: 8.9 mg/dL (ref 8.9–10.3)
Chloride: 105 mmol/L (ref 98–111)
Creatinine, Ser: 0.92 mg/dL (ref 0.61–1.24)
GFR, Estimated: >60 mL/min (ref >60)
Glucose, Bld: 88 mg/dL (ref 70–99)
Potassium: 3.4 mmol/L — ABNORMAL LOW (ref 3.5–5.1)
Sodium: 140 mmol/L (ref 135–145)
Total Bilirubin: 0.7 mg/dL (ref 0.3–1.2)
Total Protein: 7.3 g/dL (ref 6.5–8.1)

## 2020-09-21 LAB — TSH: TSH: 1.009 u[IU]/mL (ref 0.350–4.500)

## 2020-09-21 MED ORDER — HEPARIN SOD (PORK) LOCK FLUSH 100 UNIT/ML IV SOLN
500.0000 [IU] | Freq: Once | INTRAVENOUS | Status: AC
  Administered 2020-09-21: 500 [IU] via INTRAVENOUS
  Filled 2020-09-21: qty 5

## 2020-09-21 MED ORDER — NYSTATIN 100000 UNIT/ML MT SUSP: 5.0000 mL | Freq: Four times a day (QID) | OROMUCOSAL | 0 refills | Status: DC

## 2020-09-21 NOTE — Progress Notes (Signed)
Pt here for follow up. No new concerns voiced.   

## 2020-09-21 NOTE — Progress Notes (Signed)
Hematology/Oncology Follow up note [facility] [phone] [phone]   Patient Care Team: Tracie Harrier, MD as PCP - General (Internal Medicine) Earlie Server, MD as Consulting Physician (Hematology and Oncology)  REASON FOR VISIT:  Follow up for treatment of squamous lung cancer.   HISTORY OF PRESENTING ILLNESS:  # Dec 2019 Stage I squamous lung cancer #s/p Bronchoscopy on 06/25/2018. subcarina EBUS FNA was non diagnostic, hypocellular specimen.  # 08/13/2018 CT guided right upper lobe biopsy pathology showed dense fibrosis and mixed inflammatory cells with prominent polytypic plasma cells component. Focal benign bronchial wall and alveolar spaces. No malignancy was identified.   # 08/13/2018 underwent right thoracotomy and wedge resection of a right upper lobe mass.  Frozen section was consistent with an inflammatory process.  On the second postop day, preliminary pathology reports high-grade malignancy.  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  08/20/2018 Patient was therefore taken back to the OR and underwent take complete lobectomy  pT1b pN0 cM0 stage I squamous cell lung cancer.  Margin is negative.  Recommend observation.    # 12/03/2018 CT chest w contrast showed local recurrence.  12/10/2018 PET showed No evidence of distant metastatic disease # 12/26/2018 s/p bronchoscopy biopsy. Confirmed local recurrence of squamous cell lung cancer.  medi port placed by Dr.Oaks.  # 06/14/2020 CT chest w contrast was reviewed and discussed with patient.  Evidence of progressive lymphangitic spread of tumor throughout the right lung, with contralateral lung nodules with  right pleural effusion  MRI brain is negative for CNS involvement.  CT abdomen with contrast showed no evidence of abdominal metastatic disease PET scan was not approved by insurance-peer to peer appeal with Dr.Vipul Bhanderi   # NGS - KRAS V14L,  TMB 26.6-high, MS stable, PD-L1  5% CDKN2C G48, DICER1c.2117-1G>T, FLT4 G1131S, NF1 A449f, NF1 L2643*, STK11 N1857fCancer treatment Started concurrent chemoradiation on 01/16/2019 Carboplatin AUC of 2 and Taxol 45 mg/m2 weekly finished in 03/05/2019 04/10/2019, patient started on durvalumab maintenance. -CT showed progression. 07/16/2020 started on carboplatin/paclitaxel/Keytruda  INTERVAL HISTORY Samuel K DESEAN HEEMSTRAs a [age].o. male who has above history reviewed by me today presents for reatment of  recurrent  squamous cell lung cancer. During the interval, patient developed chemotherapy induced neutropenia.  Status post Zarxio daily for 3 days. Patient denies any fever, chills, shortness of breath, diarrhea, abdominal pain today. Appetite is fair.  His weight is stable.  Review of Systems  Constitutional: Positive for fatigue. Negative for appetite change, chills, fever and unexpected weight change.  HENT:   Negative for hearing loss and voice change.   Eyes: Negative for eye problems and icterus.  Respiratory: Negative for chest tightness, cough and shortness of breath.   Cardiovascular: Negative for chest pain and leg swelling.  Gastrointestinal: Negative for abdominal distention, abdominal pain and nausea.  Endocrine: Negative for hot flashes.  Genitourinary: Negative for difficulty urinating, dysuria and frequency.   Musculoskeletal: Negative for arthralgias.  Skin: Negative for itching and rash.  Neurological: Negative for light-headedness and numbness.  Hematological: Negative for adenopathy. Does not bruise/bleed easily.  Psychiatric/Behavioral: Negative for confusion.    MEDICAL HISTORY:  Past Medical History:  Diagnosis Date  . Alcohol abuse    usually drinks 2-3 drinks per day  . Atherosclerosis 06/2018  . Chronic sinusitis   . Dehydration 02/07/2019  . Emphysema of lung ([facility]   patient unaware of this.  . Hip fracture ([facility]/2019   no surgery  . History  of kidney stones 05/2018   per xray,  bilateral nephrolitiasis  . Hypertension   . Squamous cell carcinoma of lung, right ([place]) 06/2018    SURGICAL HISTORY: Past Surgical History:  Procedure Laterality Date  . BRAIN SURGERY  10/2017   nasal/sinus endoscopy. mass benign  . ELECTROMAGNETIC NAVIGATION BROCHOSCOPY Right 06/25/2018   Procedure: ELECTROMAGNETIC NAVIGATION BRONCHOSCOPY;  Surgeon: [name], MD;  Location: [facility] ORS;  Service: Cardiopulmonary;  Laterality: Right;  . ENDOBRONCHIAL ULTRASOUND Right 06/25/2018   Procedure: ENDOBRONCHIAL ULTRASOUND;  Surgeon: [name], MD;  Location: [facility] OR[facility];  Service: Cardiopulmonary;  Laterality: Right;  . ENDOBRONCHIAL ULTRASOUND Right 12/26/2018   Procedure: ENDOBRONCHIAL ULTRASOUND RIGHT;  Surgeon: [name], MD;  Location: [facility] ORS;  Service: Cardiopulmonary;  Laterality: Right;  . FLEXIBLE BRONCHOSCOPY N/A 08/20/2018   Procedure: FLEXIBLE BRONCHOSCOPY PREOP;  Surgeon: [name], MD;  Location: [facility] ORS;  Service: Thoracic;  Laterality: N/A;  . IR CV LINE INJECTION  04/04/2019  . NASAL SINUS SURGERY  10/2017   At [facility], frontal sinusotomy, ethmoidectomy, resection anterior cranial fossa neoplasm, turbinate resection  . PORTACATH PLACEMENT Left 01/15/2019   Procedure: INSERTION PORT-A-CATH;  Surgeon: [name], MD;  Location: [facility] ORS;  Service: General;  Laterality: Left;  . THORACOTOMY Right 08/13/2018   Procedure: PREOP BROCHOSCOPY WITH RIGHT THORACOTOMY AND RUL RESECTION;  Surgeon: [name], MD;  Location: [facility] OR[facility];  Service: General;  Laterality: Right;  . THORACOTOMY Right 08/20/2018   Procedure: THORACOTOMY MAJOR RIGHT UPPER LOBE LOBECTOMY;  Surgeon: [name], MD;  Location: [facility] ORS;  Service: Thoracic;  Laterality: Right;  . TOE SURGERY Left    pin in left toe    SOCIAL HISTORY: Social History   Socioeconomic History  . Marital status: Married    Spouse name: lisa  . Number of children: Not on file  . Years of education: Not on file  . Highest  education level: Not on file  Occupational History  . Occupation: welding    Comment: taking time off to resolve issues  Tobacco Use  . Smoking status: Former Smoker    Packs/day: 0.50    Years: 15.00    Pack years: 7.50    Types: Cigarettes    Quit date: 08/2018    Years since quitting: 2.1  . Smokeless tobacco: Never Used  Vaping Use  . Vaping Use: Never used  Substance and Sexual Activity  . Alcohol use: Yes    Alcohol/week: 3.0 standard drinks    Types: 3 Cans of beer per week    Comment: usually 2 drinks per week, per patient  . Drug use: No  . Sexual activity: Not on file  Other Topics Concern  . Not on file  Social History Narrative  . Not on file   Social Determinants of Health   Financial Resource Strain: Not on file  Food Insecurity: Not on file  Transportation Needs: Not on file  Physical Activity: Not on file  Stress: Not on file  Social Connections: Not on file  Intimate Partner Violence: Not on file    FAMILY HISTORY: Family History  Problem Relation Age of Onset  . Breast cancer Mother   . Diabetes Mother   . Lung cancer Father   . Hypertension Father     ALLERGIES:  has No Known Allergies.  MEDICATIONS:  Current Outpatient Medications  Medication Sig Dispense Refill  . albuterol (VENTOLIN HFA) 108 (90 Base) MCG/ACT inhaler TAKE 2 PUFFS BY MOUTH EVERY 6 HOURS AS NEEDED FOR  WHEEZE OR SHORTNESS OF BREATH 6.7 each 6  . cloNIDine (CATAPRES) 0.1 MG tablet Take 0.1 mg by mouth daily.   5  . diltiazem (CARDIZEM CD) 120 MG 24 hr capsule Take 120 mg by mouth daily.    . folic acid (V-R FOLIC ACID) 161 MCG tablet Take 1 tablet (400 mcg total) by mouth daily. 90 tablet 1  . hydrALAZINE (APRESOLINE) 100 MG tablet Take 50 mg by mouth 2 (two) times daily.     . hydrochlorothiazide (HYDRODIURIL) 12.5 MG tablet Take 12.5 mg by mouth daily.     Marland Kitchen ibuprofen (ADVIL) 600 MG tablet Take 600 mg by mouth every 6 (six) hours.    Marland Kitchen KLOR-CON M20 20 MEQ tablet Take 2  tablets (40 mEq total) by mouth daily. 180 tablet 1  . latanoprost (XALATAN) 0.005 % ophthalmic solution 1 drop at bedtime.    . lidocaine-prilocaine (EMLA) cream Apply to affected area once 30 g 3  . magnesium chloride (SLOW-MAG) 64 MG TBEC SR tablet Take 1 tablet (64 mg total) by mouth 2 (two) times daily. TAKE 1 TABLET (64 MG TOTAL) BY MOUTH DAILY. 60 tablet 1  . methocarbamol (ROBAXIN) 500 MG tablet Take 1 tablet (500 mg total) by mouth at bedtime. 30 tablet 0  . nystatin (MYCOSTATIN) 100000 UNIT/ML suspension Take 5 mLs (500,000 Units total) by mouth 4 (four) times daily. Swish and spit. 473 mL 0  . olmesartan (BENICAR) 40 MG tablet Take 40 mg by mouth every other day.     . ondansetron (ZOFRAN) 8 MG tablet Take 1 tablet (8 mg total) by mouth every 8 (eight) hours as needed for refractory nausea / vomiting. Start on day 3 after chemo. 90 tablet 3  . prochlorperazine (COMPAZINE) 10 MG tablet Take 1 tablet (10 mg total) by mouth every 6 (six) hours as needed (Nausea or vomiting). 90 tablet 3  . SPIRIVA HANDIHALER 18 MCG inhalation capsule INHALE 1 CAPSULE VIA HANDIHALER ONCE DAILY AT THE SAME TIME EVERY DAY 30 capsule 0  . SYMBICORT 80-4.5 MCG/ACT inhaler INHALE 2 PUFFS INTO THE LUNGS IN THE MORNING AND AT BEDTIME. 10.2 each 6  . vitamin B-12 (CYANOCOBALAMIN) 1000 MCG tablet Take 1 tablet (1,000 mcg total) by mouth daily. 90 tablet 1   No current facility-administered medications for this visit.   Facility-Administered Medications Ordered in Other Visits  Medication Dose Route Frequency Provider Last Rate Last Admin  . albuterol (PROVENTIL) (2.5 MG/3ML) 0.083% nebulizer solution 2.5 mg  2.5 mg Nebulization Once System, Provider Not In      . heparin lock flush 100 unit/mL  500 Units Intravenous Once Earlie Server, MD      . sodium chloride flush (NS) 0.9 % injection 10 mL  10 mL Intravenous PRN Earlie Server, MD   10 mL at 04/04/19 0903  . sodium chloride flush (NS) 0.9 % injection 10 mL  10 mL  Intravenous PRN Earlie Server, MD   10 mL at 07/26/20 1308     PHYSICAL EXAMINATION: ECOG PERFORMANCE STATUS: 1 - Symptomatic but completely ambulatory Vitals:   09/21/20 0846  BP: (!) 148/77  Pulse: 90  Resp: 18  Temp: (!) 97.1 F (36.2 C)  SpO2: 95%   Filed Weights   09/21/20 0846  Weight: 139 lb 11.2 oz (63.4 kg)    Physical Exam Constitutional:      General: He is not in acute distress. HENT:     Head: Normocephalic and atraumatic.     Mouth/Throat:  Comments: Thrush Eyes:     General: No scleral icterus. Cardiovascular:     Rate and Rhythm: Normal rate and regular rhythm.     Heart sounds: Normal heart sounds.  Pulmonary:     Effort: Pulmonary effort is normal. No respiratory distress.     Breath sounds: No wheezing.  Abdominal:     General: Bowel sounds are normal. There is no distension.     Palpations: Abdomen is soft.  Musculoskeletal:        General: No deformity. Normal range of motion.     Cervical back: Normal range of motion and neck supple.  Skin:    General: Skin is warm and dry.     Findings: No erythema or rash.  Neurological:     Mental Status: He is alert and oriented to person, place, and time. Mental status is at baseline.     Cranial Nerves: No cranial nerve deficit.     Coordination: Coordination normal.  Psychiatric:        Mood and Affect: Mood normal.      LABORATORY DATA:  I have reviewed the data as listed Lab Results  Component Value Date   WBC 9.2 09/21/2020   HGB 10.6 (L) 09/21/2020   HCT 30.3 (L) 09/21/2020   MCV 113.1 (H) 09/21/2020   PLT 139 (L) 09/21/2020   Recent Labs    03/17/20 0830 03/31/20 0825 04/07/20 0854 04/22/20 0825 08/31/20 0812 09/07/20 1117 09/21/20 0815  NA 134* 137 137   < > 137 140 140  K 4.6 3.9 3.7   < > 3.1* 4.0 3.4*  CL 101 102 104   < > 100 98 105  CO2 20* 23 21*   < > 25 27 21*  GLUCOSE 167* 126* 163*   < > 107* 103* 88  BUN _0 < > _1 CREATININE 1.19 1.10 1.10   < >  0.79 0.88 0.92  CALCIUM 9.0 9.1 8.8*   < > 8.9 9.4 8.9  GFRNONAA >60 >60 >60   < > >60 >60 >60  GFRAA >60 >60 >60  --   --   --   --   PROT 7.7 7.6 7.1   < > 7.4 7.6 7.3  ALBUMIN 3.5 3.5 3.4*   < > 3.4* 3.8 3.4*  AST 32 29 25   < > _2 ALT _3 < > _4 ALKPHOS 86 87 76   < > 101 78 98  BILITOT 0.8 0.7 0.5   < > 2.0* 0.8 0.7  BILIDIR  --   --   --   --  0.4*  --   --    < > = values in this interval not displayed.   Iron/TIBC/Ferritin/ %Sat No results found for: IRON, TIBC, FERRITIN, IRONPCTSAT   RADIOGRAPHIC STUDIES: I have personally reviewed the radiological images as listed and agreed with the findings in the report. CT Abdomen W Contrast  Result Date: 07/06/2020 CLINICAL DATA:  Right lung squamous cell carcinoma.  Staging. EXAM: CT ABDOMEN WITH CONTRAST TECHNIQUE: Multidetector CT imaging of the abdomen was performed using the standard protocol following bolus administration of intravenous contrast. CONTRAST:  124m OMNIPAQUE IOHEXOL 300 MG/ML  SOLN [facility]  [facility] FINDINGS: Lower chest: Tiny right pleural effusion, as seen on recent chest CT. Hepatobiliary: No hepatic masses identified. Gallbladder is unremarkable. No evidence of biliary ductal  dilatation. Pancreas:  No mass or inflammatory changes. Spleen:  Within normal limits in size and appearance. Adrenals/Urinary Tract: Normal adrenal glands. Mild right renal parenchymal scarring. Stable tiny cyst in lower pole of left kidney. No masses identified. A few tiny 1-2 mm renal calculi are again seen bilaterally, however there is no evidence of hydronephrosis. Stomach/Bowel: Visualized portion unremarkable. Vascular/Lymphatic: No pathologically enlarged lymph nodes identified. No abdominal aortic aneurysm. Aortic atherosclerotic calcification noted. Other:  None. Musculoskeletal:  No suspicious bone lesions identified. IMPRESSION: No evidence of abdominal metastatic disease  or other acute findings. Bilateral nephrolithiasis. No evidence of hydronephrosis. Electronically Signed   By: Marlaine Hind M.D.   On: 07/06/2020 15:49   MR Brain W Wo Contrast  Result Date: 07/01/2020 CLINICAL DATA:  Lung cancer patient.  Yearly follow-up. EXAM: MRI HEAD WITHOUT AND WITH CONTRAST TECHNIQUE: Multiplanar, multiecho pulse sequences of the brain and surrounding structures were obtained without and with intravenous contrast. CONTRAST:  94m GADAVIST GADOBUTROL 1 MMOL/ML IV SOLN COMPARISON:  01/20/2019 FINDINGS: Brain: Mild generalized volume loss. Diffusion imaging does not show any acute or subacute infarction or other cause of restricted diffusion. Mild chronic small-vessel change affects the pons. No focal cerebellar finding. Cerebral hemispheres show mild to moderate chronic small-vessel change of the white matter, advanced for age. No cortical or large vessel territory infarction. No mass lesion, hemorrhage, hydrocephalus or extra-axial collection. After contrast administration, no abnormal enhancement occurs. Vascular: Major vessels at the base of the brain show flow. Skull and upper cervical spine: Negative Sinuses/Orbits: Clear sinuses presently. Previous functional endoscopic sinus surgery. No acute orbital finding. Dysconjugate gaze. Other: None IMPRESSION: 1. No evidence of metastatic disease. 2. Brain atrophy and chronic small-vessel change of the pons and cerebral hemispheric white matter, advanced for age. Electronically Signed   By: MNelson ChimesM.D.   On: 07/01/2020 08:55     ASSESSMENT & PLAN:  1. Squamous [place] carcinoma of lung, right ([place]   2. Encounter for antineoplastic chemotherapy   3. Encounter for antineoplastic immunotherapy   4. Hypomagnesemia   5. Hypokalemia   6. Chemotherapy induced neutropenia (H[facility])   7. Thrush    #Recurrent Squamous cell carcinoma of lung, stage IV On palliative chemotherapy with carboplatin Taxol and Keytruda.  Status post 3 cycles  carboplatin/Taxol/Keytruda Labs are reviewed and discussed with patient.  Hold off treatment today.  See below Plan to obtain CT scan to evaluate treatment response after cycle 4.  #Chemotherapy-induced nausea, resolved.  Antiemetics as needed. #Chronic hypomagnesia.  Continue slow magnesium twice daily.   #Chronic hypokalemia, continue potassium chloride 20 mEq twice daily.   Potassium level is stable at 3.4.  #Chemotherapy-induced neutropenia, resolved.  Plan to proceed with long-acting prophylactic G-CSF at the next visit along with next cycle of treatment.  Awaiting insurance for approval.  #Chemotherapy-induced thrombocytopenia, improved.  139,000 today #Thrush, recommend nystatin oral rinse swish and spit.  Prescription sent to pharmacy. Return of visit: 1 week for evaluation of next cycle of treatment  ZEarlie Server MD, PhD Hematology [place]3/15/2022

## 2020-09-24 ENCOUNTER — Telehealth: Payer: Self-pay

## 2020-09-24 NOTE — Telephone Encounter (Signed)
Message received from [place] (ins auth dept) that she has received another call from [employer] stating that the Ziextenzo has been approved to receive in the clinic.  Will hold off on cancelling the [facility] request right now to  make sure there will be no further change in approval.

## 2020-09-24 NOTE — Telephone Encounter (Signed)
[facility] [facility] has approved Ziextenzo injections but can't be received at the [facility] due to the terms of his insurance plan.  It will need to be dispensed by specialty pharmacy [facility] for him to give injections at home.    Rx has been verbally called in to the [facility] oncology rx line [phone]).  The will process as an expedited request since patient is scheduled for tx on 09/28/20 with to receive Ziextenzo on 3/24.  Will call to check the status of rx at [phone].

## 2020-09-27 ENCOUNTER — Inpatient Hospital Stay: Payer: 59

## 2020-09-27 NOTE — Progress Notes (Signed)
Nutrition Assessment   Reason for Assessment:  Referral weight loss, poor appetite   ASSESSMENT:  55 year old male with stage IV squamous cell lung cancer.  Past medical history of Etoh use, HTN, right thoracotomy wedge resection 08/13/2018.  Patient receiving carboplatin, taxol and pembrolizumab.    Met with patient in clinic. Patient reports that he does not have an appetite. Denies nausea, constipation or diarrhea or trouble chewing or swallowing.  Reports yesterday ate lasagna, salad and bread for dinner.  Nothing else during the day.  Has drank ensure in the past and likes it.     Medications: folic acid, K CL, Mag, nystatin, zofran, compazine, vit b12   Labs: reviewed   Anthropometrics:   Height: 73 inches Weight: 137 lb 4 oz today UBW: 145-151 lb  BMI: 18  5% weight loss in the last 2 months, concerning   Estimated Energy Needs  Kcals: 1800-2100 Protein: 90-105 g Fluid: > 1.8 L   NUTRITION DIAGNOSIS: Inadequate oral intake related to cancer and cancer related treatment as evidenced by 5% weight loss in the last 2 months and decreased appetite   INTERVENTION:  Encouraged setting schedule/reminder to eat q 2 hours (mini meal/snack/shake) Discussed ways to add calories and protein to diet. Handout given. Complimentary case of ensure plus given to patient.  Encouraged to drink 2-3 times per day.  Contact information given   MONITORING, EVALUATION, GOAL: weight trends, intake   Next Visit: Tuesday, April 12 during infusion  Julyana Woolverton B. Zenia Resides, [place], [place] Registered Dietitian [phone] (mobile)

## 2020-09-27 NOTE — Telephone Encounter (Signed)
Called [facility] and cancelled the rx for home administration.

## 2020-09-28 ENCOUNTER — Inpatient Hospital Stay: Payer: 59

## 2020-09-28 ENCOUNTER — Inpatient Hospital Stay (HOSPITAL_BASED_OUTPATIENT_CLINIC_OR_DEPARTMENT_OTHER): Payer: 59 | Admitting: Oncology

## 2020-09-28 ENCOUNTER — Encounter: Payer: Self-pay | Admitting: Oncology

## 2020-09-28 VITALS — BP 134/73 | HR 106 | Temp 98.2°F | Resp 18 | Wt 138.2 lb

## 2020-09-28 DIAGNOSIS — C3491 Malignant neoplasm of unspecified part of right bronchus or lung: Secondary | ICD-10-CM | POA: Diagnosis not present

## 2020-09-28 DIAGNOSIS — E876 Hypokalemia: Secondary | ICD-10-CM

## 2020-09-28 DIAGNOSIS — Z5112 Encounter for antineoplastic immunotherapy: Secondary | ICD-10-CM

## 2020-09-28 DIAGNOSIS — Z5111 Encounter for antineoplastic chemotherapy: Secondary | ICD-10-CM | POA: Diagnosis not present

## 2020-09-28 DIAGNOSIS — R059 Cough, unspecified: Secondary | ICD-10-CM

## 2020-09-28 LAB — COMPREHENSIVE METABOLIC PANEL
ALT: 15 U/L (ref 0–44)
AST: 25 U/L (ref 15–41)
Albumin: 3.3 g/dL — ABNORMAL LOW (ref 3.5–5.0)
Alkaline Phosphatase: 87 U/L (ref 38–126)
Anion gap: 10 (ref 5–15)
BUN: 11 mg/dL (ref 6–20)
CO2: 26 mmol/L (ref 22–32)
Calcium: 8.7 mg/dL — ABNORMAL LOW (ref 8.9–10.3)
Chloride: 106 mmol/L (ref 98–111)
Creatinine, Ser: 0.94 mg/dL (ref 0.61–1.24)
GFR, Estimated: >60 mL/min (ref >60)
Glucose, Bld: 108 mg/dL — ABNORMAL HIGH (ref 70–99)
Potassium: 3.2 mmol/L — ABNORMAL LOW (ref 3.5–5.1)
Sodium: 142 mmol/L (ref 135–145)
Total Bilirubin: 0.7 mg/dL (ref 0.3–1.2)
Total Protein: 7.2 g/dL (ref 6.5–8.1)

## 2020-09-28 LAB — CBC WITH DIFFERENTIAL/PLATELET
Abs Immature Granulocytes: 0.16 10*3/uL — ABNORMAL HIGH (ref 0.00–0.07)
Basophils Absolute: 0.1 10*3/uL (ref 0.0–0.1)
Basophils Relative: 1 %
Eosinophils Absolute: 0 10*3/uL (ref 0.0–0.5)
Eosinophils Relative: 0 %
HCT: 30.2 % — ABNORMAL LOW (ref 39.0–52.0)
Hemoglobin: 10.3 g/dL — ABNORMAL LOW (ref 13.0–17.0)
Immature Granulocytes: 1 %
Lymphocytes Relative: 7 %
Lymphs Abs: 1.1 10*3/uL (ref 0.7–4.0)
MCH: 40.1 pg — ABNORMAL HIGH (ref 26.0–34.0)
MCHC: 34.1 g/dL (ref 30.0–36.0)
MCV: 117.5 fL — ABNORMAL HIGH (ref 80.0–100.0)
Monocytes Absolute: 1.6 10*3/uL — ABNORMAL HIGH (ref 0.1–1.0)
Monocytes Relative: 10 %
Neutro Abs: 12.7 10*3/uL — ABNORMAL HIGH (ref 1.7–7.7)
Neutrophils Relative %: 81 %
Platelets: 237 10*3/uL (ref 150–400)
RBC: 2.57 MIL/uL — ABNORMAL LOW (ref 4.22–5.81)
RDW: 21.2 % — ABNORMAL HIGH (ref 11.5–15.5)
WBC: 15.7 10*3/uL — ABNORMAL HIGH (ref 4.0–10.5)
nRBC: 0.3 % — ABNORMAL HIGH (ref 0.0–0.2)

## 2020-09-28 MED ORDER — POTASSIUM CHLORIDE 20 MEQ/100ML IV SOLN
20.0000 meq | Freq: Once | INTRAVENOUS | Status: AC
  Administered 2020-09-28: 20 meq via INTRAVENOUS

## 2020-09-28 MED ORDER — FAMOTIDINE 20 MG IN NS 100 ML IVPB
20.0000 mg | Freq: Once | INTRAVENOUS | Status: AC
  Administered 2020-09-28: 20 mg via INTRAVENOUS
  Filled 2020-09-28: qty 20
  Filled 2020-09-28: qty 100

## 2020-09-28 MED ORDER — SODIUM CHLORIDE 0.9 % IV SOLN
200.0000 mg | Freq: Once | INTRAVENOUS | Status: AC
  Administered 2020-09-28: 200 mg via INTRAVENOUS
  Filled 2020-09-28: qty 8

## 2020-09-28 MED ORDER — DIPHENHYDRAMINE HCL 50 MG/ML IJ SOLN
50.0000 mg | Freq: Once | INTRAMUSCULAR | Status: AC
  Administered 2020-09-28: 50 mg via INTRAVENOUS
  Filled 2020-09-28: qty 1

## 2020-09-28 MED ORDER — SODIUM CHLORIDE 0.9 % IV SOLN
Freq: Once | INTRAVENOUS | Status: AC
  Filled 2020-09-28: qty 250

## 2020-09-28 MED ORDER — PALONOSETRON HCL INJECTION 0.25 MG/5ML
0.2500 mg | Freq: Once | INTRAVENOUS | Status: AC
  Administered 2020-09-28: 0.25 mg via INTRAVENOUS
  Filled 2020-09-28: qty 5

## 2020-09-28 MED ORDER — SODIUM CHLORIDE 0.9 % IV SOLN
10.0000 mg | Freq: Once | INTRAVENOUS | Status: AC
  Administered 2020-09-28: 10 mg via INTRAVENOUS
  Filled 2020-09-28: qty 10

## 2020-09-28 MED ORDER — LORAZEPAM 2 MG/ML IJ SOLN
0.5000 mg | Freq: Once | INTRAMUSCULAR | Status: AC | PRN
  Administered 2020-09-28: 0.5 mg via INTRAVENOUS
  Filled 2020-09-28: qty 1

## 2020-09-28 MED ORDER — HEPARIN SOD (PORK) LOCK FLUSH 100 UNIT/ML IV SOLN
500.0000 [IU] | Freq: Once | INTRAVENOUS | Status: AC
  Administered 2020-09-28: 500 [IU] via INTRAVENOUS
  Filled 2020-09-28: qty 5

## 2020-09-28 MED ORDER — SODIUM CHLORIDE 0.9% FLUSH
10.0000 mL | INTRAVENOUS | Status: DC | PRN
  Administered 2020-09-28: 10 mL via INTRAVENOUS
  Filled 2020-09-28: qty 10

## 2020-09-28 MED ORDER — SODIUM CHLORIDE 0.9 % IV SOLN
150.0000 mg | Freq: Once | INTRAVENOUS | Status: AC
  Administered 2020-09-28: 150 mg via INTRAVENOUS
  Filled 2020-09-28: qty 5

## 2020-09-28 MED ORDER — SODIUM CHLORIDE 0.9 % IV SOLN
100.0000 mg/m2 | Freq: Once | INTRAVENOUS | Status: AC
  Administered 2020-09-28: 180 mg via INTRAVENOUS
  Filled 2020-09-28: qty 30

## 2020-09-28 MED ORDER — SODIUM CHLORIDE 0.9 % IV SOLN
527.0000 mg | Freq: Once | INTRAVENOUS | Status: AC
  Administered 2020-09-28: 530 mg via INTRAVENOUS
  Filled 2020-09-28: qty 53

## 2020-09-28 NOTE — Progress Notes (Signed)
Patient has new left side hip to knee pain, 6/10 pain scale today.

## 2020-09-28 NOTE — Progress Notes (Signed)
Hematology/Oncology Follow up note [facility] Telephone:([phone] Fax:(336) [phone]   Patient Care Team: Tracie Harrier, MD as PCP - General (Internal Medicine) Earlie Server, MD as Consulting Physician (Hematology and Oncology)  REASON FOR VISIT:  Follow up for treatment of squamous lung cancer.   HISTORY OF PRESENTING ILLNESS:  # Dec 2019 Stage I squamous lung cancer #s/p Bronchoscopy on 06/25/2018. subcarina EBUS FNA was non diagnostic, hypocellular specimen.  # 08/13/2018 CT guided right upper lobe biopsy pathology showed dense fibrosis and mixed inflammatory cells with prominent polytypic plasma cells component. Focal benign bronchial wall and alveolar spaces. No malignancy was identified.   # 08/13/2018 underwent right thoracotomy and wedge resection of a right upper lobe mass.  Frozen section was consistent with an inflammatory process.  On Samuel second postop day, preliminary pathology reports high-grade malignancy.  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  08/20/2018 Patient was therefore taken back to [facility] OR and underwent take complete lobectomy  pT1b pN0 cM0 stage I squamous cell lung cancer.  Margin is negative.  Recommend observation.    # 12/03/2018 CT chest w contrast showed local recurrence.  12/10/2018 PET showed No evidence of distant metastatic disease # 12/26/2018 s/p bronchoscopy biopsy. Confirmed local recurrence of squamous cell lung cancer.  medi port placed by Dr.Oaks.  # 06/14/2020 CT chest w contrast was reviewed and discussed with patient.  Evidence of progressive lymphangitic spread of tumor throughout Samuel right lung, with contralateral lung nodules with  right pleural effusion  MRI brain is negative for CNS involvement.  CT abdomen with contrast showed no evidence of abdominal metastatic disease PET scan was not approved by insurance-peer to peer appeal with Dr.Vipul Bhanderi   # NGS - KRAS V14L,  TMB 26.6-high, MS stable, PD-L1  5% CDKN2C G48, DICER1c.2117-1G>T, FLT4 G1131S, NF1 A450f, NF1 L2643*, STK11 N186fCancer treatment Started concurrent chemoradiation on 01/16/2019 Carboplatin AUC of 2 and Taxol 45 mg/m2 weekly finished in 03/05/2019 04/10/2019, patient started on durvalumab maintenance. -CT showed progression. 07/16/2020 started on carboplatin/paclitaxel/Keytruda  INTERVAL HISTORY Samuel Choi MORANDIs a [age].o. male who has above history reviewed by me today presents for reatment of  recurrent  squamous cell lung cancer.  patient developed chemotherapy induced neutropenia.  Status post Zarxio daily for 3 days. Patient reports feeling well today.  He has noticed left hip pain which started today.  He recalls having had episodes of left hip and thigh pain in Samuel past which spontaneously resolved.  Review of Systems  Constitutional: Positive for fatigue. Negative for appetite change, chills, fever and unexpected weight change.  HENT:   Negative for hearing loss and voice change.   Eyes: Negative for eye problems and icterus.  Respiratory: Negative for chest tightness, cough and shortness of breath.   Cardiovascular: Negative for chest pain and leg swelling.  Gastrointestinal: Negative for abdominal distention, abdominal pain and nausea.  Endocrine: Negative for hot flashes.  Genitourinary: Negative for difficulty urinating, dysuria and frequency.   Musculoskeletal: Negative for arthralgias.  Skin: Negative for itching and rash.  Neurological: Negative for light-headedness and numbness.  Hematological: Negative for adenopathy. Does not bruise/bleed easily.  Psychiatric/Behavioral: Negative for confusion.    MEDICAL HISTORY:  Past Medical History:  Diagnosis Date  . Alcohol abuse    usually drinks 2-3 drinks per day  . Atherosclerosis 06/2018  . Chronic sinusitis   . Dehydration 02/07/2019  . [place]   patient unaware of this.  .Marland Kitchen  Hip fracture ([place]) 05/2018   no surgery  . History  of kidney stones 05/2018   per xray, bilateral nephrolitiasis  . Hypertension   . Squamous cell carcinoma of lung, right ([place]) 06/2018    SURGICAL HISTORY: Past Surgical History:  Procedure Laterality Date  . BRAIN SURGERY  10/2017   nasal/sinus endoscopy. mass benign  . ELECTROMAGNETIC NAVIGATION BROCHOSCOPY Right 06/25/2018   Procedure: ELECTROMAGNETIC NAVIGATION BRONCHOSCOPY;  Surgeon: [name], MD;  Location: [facility] ORS;  Service: Cardiopulmonary;  Laterality: Right;  . ENDOBRONCHIAL ULTRASOUND Right 06/25/2018   Procedure: ENDOBRONCHIAL ULTRASOUND;  Surgeon: [name], MD;  Location: [facility] ORS;  Service: Cardiopulmonary;  Laterality: Right;  . ENDOBRONCHIAL ULTRASOUND Right 12/26/2018   Procedure: ENDOBRONCHIAL ULTRASOUND RIGHT;  Surgeon: [name], MD;  Location: [facility] ORS;  Service: Cardiopulmonary;  Laterality: Right;  . FLEXIBLE BRONCHOSCOPY N/A 08/20/2018   Procedure: FLEXIBLE BRONCHOSCOPY PREOP;  Surgeon: [name], MD;  Location: [facility] ORS;  Service: Thoracic;  Laterality: N/A;  . IR CV LINE INJECTION  04/04/2019  . NASAL SINUS SURGERY  10/2017   At [facility], frontal sinusotomy, ethmoidectomy, resection anterior cranial fossa neoplasm, turbinate resection  . PORTACATH PLACEMENT Left 01/15/2019   Procedure: INSERTION PORT-A-CATH;  Surgeon: [name], MD;  Location: [facility] ORS;  Service: General;  Laterality: Left;  . THORACOTOMY Right 08/13/2018   Procedure: PREOP BROCHOSCOPY WITH RIGHT THORACOTOMY AND RUL RESECTION;  Surgeon: [name], MD;  Location: [facility] ORS;  Service: General;  Laterality: Right;  . THORACOTOMY Right 08/20/2018   Procedure: THORACOTOMY MAJOR RIGHT UPPER LOBE LOBECTOMY;  Surgeon: [name], MD;  Location: [facility] ORS;  Service: Thoracic;  Laterality: Right;  . TOE SURGERY Left    pin in left toe    SOCIAL HISTORY: Social History   Socioeconomic History  . Marital status: Married    Spouse name: lisa  . Number of children: Not on file  . Years  of education: Not on file  . Highest education level: Not on file  Occupational History  . Occupation: welding    Comment: taking time off to resolve issues  Tobacco Use  . Smoking status: Former Smoker    Packs/day: 0.50    Years: 15.00    Pack years: 7.50    Types: Cigarettes    Quit date: 08/2018    Years since quitting: 2.1  . Smokeless tobacco: Never Used  Vaping Use  . Vaping Use: Never used  Substance and Sexual Activity  . Alcohol use: Yes    Alcohol/week: 3.0 standard drinks    Types: 3 Cans of beer per week    Comment: usually 2 drinks per week, per patient  . Drug use: No  . Sexual activity: Not on file  Other Topics Concern  . Not on file  Social History Narrative  . Not on file   Social Determinants of Health   Financial Resource Strain: Not on file  Food Insecurity: Not on file  Transportation Needs: Not on file  Physical Activity: Not on file  Stress: Not on file  Social Connections: Not on file  Intimate Partner Violence: Not on file    FAMILY HISTORY: Family History  Problem Relation Age of Onset  . Breast cancer Mother   . Diabetes Mother   . Lung cancer Father   . Hypertension Father     ALLERGIES:  has No Known Allergies.  MEDICATIONS:  Current Outpatient Medications  Medication Sig Dispense Refill  . albuterol (VENTOLIN HFA) 108 (90 Base) MCG/ACT inhaler  TAKE 2 PUFFS BY MOUTH EVERY 6 HOURS AS NEEDED FOR WHEEZE OR SHORTNESS OF BREATH 6.7 each 6  . cloNIDine (CATAPRES) 0.1 MG tablet Take 0.1 mg by mouth daily.   5  . diltiazem (CARDIZEM CD) 120 MG 24 hr capsule Take 120 mg by mouth daily.    . folic acid (V-R FOLIC ACID) 643 MCG tablet Take 1 tablet (400 mcg total) by mouth daily. 90 tablet 1  . hydrALAZINE (APRESOLINE) 100 MG tablet Take 50 mg by mouth 2 (two) times daily.     . hydrochlorothiazide (HYDRODIURIL) 12.5 MG tablet Take 12.5 mg by mouth daily.     Marland Kitchen ibuprofen (ADVIL) 600 MG tablet Take 600 mg by mouth every 6 (six) hours.     Marland Kitchen KLOR-CON M20 20 MEQ tablet Take 2 tablets (40 mEq total) by mouth daily. 180 tablet 1  . latanoprost (XALATAN) 0.005 % ophthalmic solution 1 drop at bedtime.    . lidocaine-prilocaine (EMLA) cream Apply to affected area once 30 g 3  . loratadine (CLARITIN) 10 MG tablet Take 10 mg by mouth daily.    . magnesium chloride (SLOW-MAG) 64 MG TBEC SR tablet Take 1 tablet (64 mg total) by mouth 2 (two) times daily. TAKE 1 TABLET (64 MG TOTAL) BY MOUTH DAILY. 60 tablet 1  . methocarbamol (ROBAXIN) 500 MG tablet Take 1 tablet (500 mg total) by mouth at bedtime. 30 tablet 0  . nystatin (MYCOSTATIN) 100000 UNIT/ML suspension Take 5 mLs (500,000 Units total) by mouth 4 (four) times daily. Swish and spit. 473 mL 0  . olmesartan (BENICAR) 40 MG tablet Take 40 mg by mouth every other day.     . ondansetron (ZOFRAN) 8 MG tablet Take 1 tablet (8 mg total) by mouth every 8 (eight) hours as needed for refractory nausea / vomiting. Start on day 3 after chemo. 90 tablet 3  . prochlorperazine (COMPAZINE) 10 MG tablet Take 1 tablet (10 mg total) by mouth every 6 (six) hours as needed (Nausea or vomiting). 90 tablet 3  . SPIRIVA HANDIHALER 18 MCG inhalation capsule INHALE 1 CAPSULE VIA HANDIHALER ONCE DAILY AT Samuel SAME TIME EVERY DAY 30 capsule 0  . SYMBICORT 80-4.5 MCG/ACT inhaler INHALE 2 PUFFS INTO Samuel LUNGS IN Samuel MORNING AND AT BEDTIME. 10.2 each 6  . vitamin B-12 (CYANOCOBALAMIN) 1000 MCG tablet Take 1 tablet (1,000 mcg total) by mouth daily. 90 tablet 1   No current facility-administered medications for this visit.   Facility-Administered Medications Ordered in Other Visits  Medication Dose Route Frequency Provider Last Rate Last Admin  . albuterol (PROVENTIL) (2.5 MG/3ML) 0.083% nebulizer solution 2.5 mg  2.5 mg Nebulization Once System, Provider Not In      . heparin lock flush 100 unit/mL  500 Units Intravenous Once Earlie Server, MD      . sodium chloride flush (NS) 0.9 % injection 10 mL  10 mL Intravenous PRN  Earlie Server, MD   10 mL at 04/04/19 0903  . sodium chloride flush (NS) 0.9 % injection 10 mL  10 mL Intravenous PRN Earlie Server, MD   10 mL at 07/26/20 1308  . sodium chloride flush (NS) 0.9 % injection 10 mL  10 mL Intravenous PRN Earlie Server, MD   10 mL at 09/28/20 0846     PHYSICAL EXAMINATION: ECOG PERFORMANCE STATUS: 1 - Symptomatic but completely ambulatory Vitals:   09/28/20 0933  BP: 134/73  Pulse: (!) 106  Resp: 18  Temp: 98.2 F (36.8 C)  Filed Weights   09/28/20 0933  Weight: 138 lb 3.2 oz (62.7 kg)    Physical Exam Constitutional:      General: He is not in acute distress. HENT:     Head: Normocephalic and atraumatic.  Eyes:     General: No scleral icterus. Cardiovascular:     Rate and Rhythm: Normal rate and regular rhythm.     Heart sounds: Normal heart sounds.  Pulmonary:     Effort: Pulmonary effort is normal. No respiratory distress.     Breath sounds: No wheezing.  Abdominal:     General: Bowel sounds are normal. There is no distension.     Palpations: Abdomen is soft.  Musculoskeletal:        General: No deformity. Normal range of motion.     Cervical back: Normal range of motion and neck supple.  Skin:    General: Skin is warm and dry.     Findings: No erythema or rash.  Neurological:     Mental Status: He is alert and oriented to person, place, and time. Mental status is at baseline.     Cranial Nerves: No cranial nerve deficit.     Coordination: Coordination normal.  Psychiatric:        Mood and Affect: Mood normal.      LABORATORY DATA:  I have reviewed Samuel data as listed Lab Results  Component Value Date   WBC 15.7 (H) 09/28/2020   HGB 10.3 (L) 09/28/2020   HCT 30.2 (L) 09/28/2020   MCV 117.5 (H) 09/28/2020   PLT 237 09/28/2020   Recent Labs    03/17/20 0830 03/31/20 0825 04/07/20 0854 04/22/20 0825 08/31/20 0812 09/07/20 1117 09/21/20 0815 09/28/20 0846  NA 134* 137 137   < > 137 140 140 142  K 4.6 3.9 3.7   < > 3.1* 4.0 3.4*  3.2*  CL 101 102 104   < > 100 98 105 106  CO2 20* 23 21*   < > 25 27 21* 26  GLUCOSE 167* 126* 163*   < > 107* 103* 88 108*  BUN _0 < > _1 CREATININE 1.19 1.10 1.10   < > 0.79 0.88 0.92 0.94  CALCIUM 9.0 9.1 8.8*   < > 8.9 9.4 8.9 8.7*  GFRNONAA >60 >60 >60   < > >60 >60 >60 >60  GFRAA >60 >60 >60  --   --   --   --   --   PROT 7.7 7.6 7.1   < > 7.4 7.6 7.3 7.2  ALBUMIN 3.5 3.5 3.4*   < > 3.4* 3.8 3.4* 3.3*  AST 32 29 25   < > _2 ALT _3 < > _4 ALKPHOS 86 87 76   < > 101 78 98 87  BILITOT 0.8 0.7 0.5   < > 2.0* 0.8 0.7 0.7  BILIDIR  --   --   --   --  0.4*  --   --   --    < > = values in this interval not displayed.   Iron/TIBC/Ferritin/ %Sat No results found for: IRON, TIBC, FERRITIN, IRONPCTSAT   RADIOGRAPHIC STUDIES: I have personally reviewed Samuel radiological images as listed and agreed with Samuel findings in Samuel report. CT Abdomen W Contrast  Result Date: 07/06/2020 CLINICAL DATA:  Right lung squamous cell carcinoma.  Staging. EXAM: CT ABDOMEN WITH  CONTRAST TECHNIQUE: Multidetector CT imaging of Samuel abdomen was performed using Samuel standard protocol following bolus administration of intravenous contrast. CONTRAST:  134m OMNIPAQUE IOHEXOL 300 MG/ML  SOLN COMPAR[facility]:  [facility] FINDINGS: Lower chest: Tiny right pleural effusion, as seen on recent chest CT. Hepatobiliary: No hepatic masses identified. Gallbladder is unremarkable. No evidence of biliary ductal dilatation. Pancreas:  No mass or inflammatory changes. Spleen:  Within normal limits in size and appearance. Adrenals/Urinary Tract: Normal adrenal glands. Mild right renal parenchymal scarring. Stable tiny cyst in lower pole of left kidney. No masses identified. A few tiny 1-2 mm renal calculi are again seen bilaterally, however there is no evidence of hydronephrosis. Stomach/Bowel: Visualized portion unremarkable. Vascular/Lymphatic: No  pathologically enlarged lymph nodes identified. No abdominal aortic aneurysm. Aortic atherosclerotic calcification noted. Other:  None. Musculoskeletal:  No suspicious bone lesions identified. IMPRESSION: No evidence of abdominal metastatic disease or other acute findings. Bilateral nephrolithiasis. No evidence of hydronephrosis. Electronically Signed   By: JMarlaine HindM.D.   On: 07/06/2020 15:49   MR Brain W Wo Contrast  Result Date: 07/01/2020 CLINICAL DATA:  Lung cancer patient.  Yearly follow-up. EXAM: MRI HEAD WITHOUT AND WITH CONTRAST TECHNIQUE: Multiplanar, multiecho pulse sequences of Samuel brain and surrounding structures were obtained without and with intravenous contrast. CONTRAST:  659mGADAVIST GADOBUTROL 1 MMOL/ML IV SOLN COMPARISON:  01/20/2019 FINDINGS: Brain: Mild generalized volume loss. Diffusion imaging does not show any acute or subacute infarction or other cause of restricted diffusion. Mild chronic small-vessel change affects Samuel pons. No focal cerebellar finding. Cerebral hemispheres show mild to moderate chronic small-vessel change of Samuel white matter, advanced for age. No cortical or large vessel territory infarction. No mass lesion, hemorrhage, hydrocephalus or extra-axial collection. After contrast administration, no abnormal enhancement occurs. Vascular: Major vessels at Samuel base of Samuel brain show flow. Skull and upper cervical spine: Negative Sinuses/Orbits: Clear sinuses presently. Previous functional endoscopic sinus surgery. No acute orbital finding. Dysconjugate gaze. Other: None IMPRESSION: 1. No evidence of metastatic disease. 2. Brain atrophy and chronic small-vessel change of Samuel pons and cerebral hemispheric white matter, advanced for age. Electronically Signed   By: MaNelson Chimes.D.   On: 07/01/2020 08:55     ASSESSMENT & PLAN:  1. Squamous cell carcinoma of lung, right ([facility]  2. Hypomagnesemia   3. Hypokalemia   4. Encounter for antineoplastic chemotherapy   5.  Encounter for antineoplastic immunotherapy    #Recurrent Squamous cell carcinoma of lung, stage IV On palliative chemotherapy with carboplatin Taxol and Keytruda.  Labs reviewed and discussed with patient. Proceed with cycle 4 carboplatin/dose reduce Taxol/Keytruda. obtain CT scan to evaluate treatment response   #Chemotherapy-induced nausea, resolved.  Antiemetics as needed. #Chronic hypomagnesia.  Continue slow magnesium twice daily.   #Chronic hypokalemia, continue potassium chloride 20 mEq twice daily.   Potassium level is 3.2 today patient will receive IV potassium 20 mEq x 1 today.  #Chemotherapy-induced neutropenia, resolved.  Prophylactic long-acting G-CSF Ziextenzo day 3.  #Chemotherapy-induced thrombocytopenia, normalized.  Taxol dose reduction #Thrush, recommend nystatin oral rinse swish and spit.  Prescription sent to pharmacy. Return of visit: Weekly labs.  Follow-up in 3 weeks.   ZhEarlie ServerMD, PhD Hematology Oncology [facility]/22/2022

## 2020-09-29 ENCOUNTER — Telehealth: Payer: Self-pay

## 2020-09-29 DIAGNOSIS — C3491 Malignant neoplasm of unspecified part of right bronchus or lung: Secondary | ICD-10-CM

## 2020-09-29 NOTE — Telephone Encounter (Signed)
-----   Message from Earlie Server, MD sent at 09/28/2020  8:17 PM EDT ----- Please arrange patient to have CT chest abdomen pelvis with contrast next available.  Reason is lung cancer, follow-up for treatment response.  Patient knows about the plan.

## 2020-09-29 NOTE — Telephone Encounter (Signed)
Please schedule as requested by Dr. Tasia Catchings and inform pt of appt.

## 2020-09-29 NOTE — Telephone Encounter (Signed)
Done..  CT is sched for 10/15/20  Pt was made aware.

## 2020-09-30 ENCOUNTER — Inpatient Hospital Stay: Payer: 59

## 2020-09-30 DIAGNOSIS — C3491 Malignant neoplasm of unspecified part of right bronchus or lung: Secondary | ICD-10-CM

## 2020-09-30 DIAGNOSIS — Z5111 Encounter for antineoplastic chemotherapy: Secondary | ICD-10-CM | POA: Diagnosis not present

## 2020-09-30 MED ORDER — PEGFILGRASTIM-BMEZ 6 MG/0.6ML ~~LOC~~ SOSY
6.0000 mg | PREFILLED_SYRINGE | Freq: Once | SUBCUTANEOUS | Status: AC
  Administered 2020-09-30: 6 mg via SUBCUTANEOUS
  Filled 2020-09-30: qty 0.6

## 2020-10-05 ENCOUNTER — Other Ambulatory Visit: Payer: Self-pay

## 2020-10-05 ENCOUNTER — Inpatient Hospital Stay: Payer: 59

## 2020-10-05 DIAGNOSIS — Z5111 Encounter for antineoplastic chemotherapy: Secondary | ICD-10-CM | POA: Diagnosis not present

## 2020-10-05 DIAGNOSIS — C3491 Malignant neoplasm of unspecified part of right bronchus or lung: Secondary | ICD-10-CM

## 2020-10-05 LAB — CBC WITH DIFFERENTIAL/PLATELET
Abs Immature Granulocytes: 4.68 10*3/uL — ABNORMAL HIGH (ref 0.00–0.07)
Basophils Absolute: 0.1 10*3/uL (ref 0.0–0.1)
Basophils Relative: 0 %
Eosinophils Absolute: 0.2 10*3/uL (ref 0.0–0.5)
Eosinophils Relative: 0 %
HCT: 30.5 % — ABNORMAL LOW (ref 39.0–52.0)
Hemoglobin: 10.8 g/dL — ABNORMAL LOW (ref 13.0–17.0)
Immature Granulocytes: 7 %
Lymphocytes Relative: 5 %
Lymphs Abs: 3.2 10*3/uL (ref 0.7–4.0)
MCH: 42.2 pg — ABNORMAL HIGH (ref 26.0–34.0)
MCHC: 35.4 g/dL (ref 30.0–36.0)
MCV: 119.1 fL — ABNORMAL HIGH (ref 80.0–100.0)
Monocytes Absolute: 6.3 10*3/uL — ABNORMAL HIGH (ref 0.1–1.0)
Monocytes Relative: 10 %
Neutro Abs: 48.5 10*3/uL — ABNORMAL HIGH (ref 1.7–7.7)
Neutrophils Relative %: 78 %
Platelets: 215 10*3/uL (ref 150–400)
RBC: 2.56 MIL/uL — ABNORMAL LOW (ref 4.22–5.81)
RDW: 18.4 % — ABNORMAL HIGH (ref 11.5–15.5)
Smear Review: ADEQUATE
WBC: 62.9 10*3/uL (ref 4.0–10.5)
nRBC: 1.3 % — ABNORMAL HIGH (ref 0.0–0.2)

## 2020-10-05 LAB — SAMPLE TO BLOOD BANK

## 2020-10-12 ENCOUNTER — Inpatient Hospital Stay: Payer: 59 | Attending: Oncology

## 2020-10-12 DIAGNOSIS — Z7951 Long term (current) use of inhaled steroids: Secondary | ICD-10-CM | POA: Diagnosis not present

## 2020-10-12 DIAGNOSIS — Z79899 Other long term (current) drug therapy: Secondary | ICD-10-CM | POA: Insufficient documentation

## 2020-10-12 DIAGNOSIS — T451X5A Adverse effect of antineoplastic and immunosuppressive drugs, initial encounter: Secondary | ICD-10-CM | POA: Diagnosis not present

## 2020-10-12 DIAGNOSIS — Z833 Family history of diabetes mellitus: Secondary | ICD-10-CM | POA: Diagnosis not present

## 2020-10-12 DIAGNOSIS — Z801 Family history of malignant neoplasm of trachea, bronchus and lung: Secondary | ICD-10-CM | POA: Insufficient documentation

## 2020-10-12 DIAGNOSIS — Z902 Acquired absence of lung [part of]: Secondary | ICD-10-CM | POA: Insufficient documentation

## 2020-10-12 DIAGNOSIS — D6959 Other secondary thrombocytopenia: Secondary | ICD-10-CM | POA: Insufficient documentation

## 2020-10-12 DIAGNOSIS — Z5112 Encounter for antineoplastic immunotherapy: Secondary | ICD-10-CM | POA: Insufficient documentation

## 2020-10-12 DIAGNOSIS — D701 Agranulocytosis secondary to cancer chemotherapy: Secondary | ICD-10-CM | POA: Diagnosis not present

## 2020-10-12 DIAGNOSIS — C3411 Malignant neoplasm of upper lobe, right bronchus or lung: Secondary | ICD-10-CM | POA: Insufficient documentation

## 2020-10-12 DIAGNOSIS — E876 Hypokalemia: Secondary | ICD-10-CM | POA: Diagnosis not present

## 2020-10-12 DIAGNOSIS — C3491 Malignant neoplasm of unspecified part of right bronchus or lung: Secondary | ICD-10-CM

## 2020-10-12 DIAGNOSIS — Z803 Family history of malignant neoplasm of breast: Secondary | ICD-10-CM | POA: Diagnosis not present

## 2020-10-12 DIAGNOSIS — Z87891 Personal history of nicotine dependence: Secondary | ICD-10-CM | POA: Diagnosis not present

## 2020-10-12 DIAGNOSIS — Z8249 Family history of ischemic heart disease and other diseases of the circulatory system: Secondary | ICD-10-CM | POA: Insufficient documentation

## 2020-10-12 DIAGNOSIS — I10 Essential (primary) hypertension: Secondary | ICD-10-CM | POA: Insufficient documentation

## 2020-10-12 LAB — CBC WITH DIFFERENTIAL/PLATELET
Abs Immature Granulocytes: 0.17 10*3/uL — ABNORMAL HIGH (ref 0.00–0.07)
Basophils Absolute: 0.1 10*3/uL (ref 0.0–0.1)
Basophils Relative: 1 %
Eosinophils Absolute: 0.4 10*3/uL (ref 0.0–0.5)
Eosinophils Relative: 2 %
HCT: 30.4 % — ABNORMAL LOW (ref 39.0–52.0)
Hemoglobin: 10.5 g/dL — ABNORMAL LOW (ref 13.0–17.0)
Immature Granulocytes: 1 %
Lymphocytes Relative: 7 %
Lymphs Abs: 1.8 10*3/uL (ref 0.7–4.0)
MCH: 41.8 pg — ABNORMAL HIGH (ref 26.0–34.0)
MCHC: 34.5 g/dL (ref 30.0–36.0)
MCV: 121.1 fL — ABNORMAL HIGH (ref 80.0–100.0)
Monocytes Absolute: 1.1 10*3/uL — ABNORMAL HIGH (ref 0.1–1.0)
Monocytes Relative: 4 %
Neutro Abs: 21.4 10*3/uL — ABNORMAL HIGH (ref 1.7–7.7)
Neutrophils Relative %: 85 %
Platelets: 105 10*3/uL — ABNORMAL LOW (ref 150–400)
RBC: 2.51 MIL/uL — ABNORMAL LOW (ref 4.22–5.81)
RDW: 18.4 % — ABNORMAL HIGH (ref 11.5–15.5)
WBC: 25 10*3/uL — ABNORMAL HIGH (ref 4.0–10.5)
nRBC: 0.9 % — ABNORMAL HIGH (ref 0.0–0.2)

## 2020-10-12 LAB — SAMPLE TO BLOOD BANK

## 2020-10-15 ENCOUNTER — Ambulatory Visit
Admission: RE | Admit: 2020-10-15 | Discharge: 2020-10-15 | Disposition: A | Discharge status: Home / Self Care | Payer: 59 | Source: Ambulatory Visit | Attending: Oncology | Admitting: Oncology

## 2020-10-15 ENCOUNTER — Other Ambulatory Visit: Payer: Self-pay

## 2020-10-15 DIAGNOSIS — C3491 Malignant neoplasm of unspecified part of right bronchus or lung: Secondary | ICD-10-CM

## 2020-10-15 MED ORDER — IOHEXOL 300 MG/ML  SOLN
100.0000 mL | Freq: Once | INTRAMUSCULAR | Status: AC | PRN
  Administered 2020-10-15: 100 mL via INTRAVENOUS

## 2020-10-19 ENCOUNTER — Inpatient Hospital Stay: Payer: 59

## 2020-10-19 ENCOUNTER — Encounter: Payer: Self-pay | Admitting: Oncology

## 2020-10-19 ENCOUNTER — Inpatient Hospital Stay (HOSPITAL_BASED_OUTPATIENT_CLINIC_OR_DEPARTMENT_OTHER): Payer: 59 | Admitting: Oncology

## 2020-10-19 VITALS — BP 122/78 | HR 105 | Temp 97.2°F | Resp 16 | Wt 136.4 lb

## 2020-10-19 DIAGNOSIS — R059 Cough, unspecified: Secondary | ICD-10-CM

## 2020-10-19 DIAGNOSIS — T451X5A Adverse effect of antineoplastic and immunosuppressive drugs, initial encounter: Secondary | ICD-10-CM

## 2020-10-19 DIAGNOSIS — C3491 Malignant neoplasm of unspecified part of right bronchus or lung: Secondary | ICD-10-CM | POA: Diagnosis not present

## 2020-10-19 DIAGNOSIS — Z5112 Encounter for antineoplastic immunotherapy: Secondary | ICD-10-CM

## 2020-10-19 DIAGNOSIS — E876 Hypokalemia: Secondary | ICD-10-CM | POA: Diagnosis not present

## 2020-10-19 DIAGNOSIS — D701 Agranulocytosis secondary to cancer chemotherapy: Secondary | ICD-10-CM

## 2020-10-19 LAB — COMPREHENSIVE METABOLIC PANEL
ALT: 14 U/L (ref 0–44)
AST: 24 U/L (ref 15–41)
Albumin: 3.7 g/dL (ref 3.5–5.0)
Alkaline Phosphatase: 106 U/L (ref 38–126)
Anion gap: 14 (ref 5–15)
BUN: 8 mg/dL (ref 6–20)
CO2: 25 mmol/L (ref 22–32)
Calcium: 9 mg/dL (ref 8.9–10.3)
Chloride: 102 mmol/L (ref 98–111)
Creatinine, Ser: 0.71 mg/dL (ref 0.61–1.24)
GFR, Estimated: >60 mL/min (ref >60)
Glucose, Bld: 78 mg/dL (ref 70–99)
Potassium: 3.1 mmol/L — ABNORMAL LOW (ref 3.5–5.1)
Sodium: 141 mmol/L (ref 135–145)
Total Bilirubin: 0.6 mg/dL (ref 0.3–1.2)
Total Protein: 7.9 g/dL (ref 6.5–8.1)

## 2020-10-19 LAB — CBC WITH DIFFERENTIAL/PLATELET
Abs Immature Granulocytes: 0.06 10*3/uL (ref 0.00–0.07)
Basophils Absolute: 0.1 10*3/uL (ref 0.0–0.1)
Basophils Relative: 1 %
Eosinophils Absolute: 0.2 10*3/uL (ref 0.0–0.5)
Eosinophils Relative: 1 %
HCT: 33.7 % — ABNORMAL LOW (ref 39.0–52.0)
Hemoglobin: 12 g/dL — ABNORMAL LOW (ref 13.0–17.0)
Immature Granulocytes: 0 %
Lymphocytes Relative: 7 %
Lymphs Abs: 1 10*3/uL (ref 0.7–4.0)
MCH: 42.9 pg — ABNORMAL HIGH (ref 26.0–34.0)
MCHC: 35.6 g/dL (ref 30.0–36.0)
MCV: 120.4 fL — ABNORMAL HIGH (ref 80.0–100.0)
Monocytes Absolute: 1 10*3/uL (ref 0.1–1.0)
Monocytes Relative: 7 %
Neutro Abs: 11.2 10*3/uL — ABNORMAL HIGH (ref 1.7–7.7)
Neutrophils Relative %: 84 %
Platelets: 136 10*3/uL — ABNORMAL LOW (ref 150–400)
RBC: 2.8 MIL/uL — ABNORMAL LOW (ref 4.22–5.81)
RDW: 16.5 % — ABNORMAL HIGH (ref 11.5–15.5)
WBC: 13.5 10*3/uL — ABNORMAL HIGH (ref 4.0–10.5)
nRBC: 0 % (ref 0.0–0.2)

## 2020-10-19 MED ORDER — HEPARIN SOD (PORK) LOCK FLUSH 100 UNIT/ML IV SOLN
INTRAVENOUS | Status: AC
  Filled 2020-10-19: qty 5

## 2020-10-19 MED ORDER — DIPHENHYDRAMINE HCL 50 MG/ML IJ SOLN
25.0000 mg | Freq: Once | INTRAMUSCULAR | Status: AC
  Administered 2020-10-19: 25 mg via INTRAVENOUS
  Filled 2020-10-19: qty 1

## 2020-10-19 MED ORDER — SODIUM CHLORIDE 0.9 % IV SOLN
10.0000 mg | Freq: Once | INTRAVENOUS | Status: AC
  Administered 2020-10-19: 10 mg via INTRAVENOUS
  Filled 2020-10-19: qty 10

## 2020-10-19 MED ORDER — SODIUM CHLORIDE 0.9 % IV SOLN
Freq: Once | INTRAVENOUS | Status: AC
  Filled 2020-10-19: qty 250

## 2020-10-19 MED ORDER — SODIUM CHLORIDE 0.9 % IV SOLN
200.0000 mg | Freq: Once | INTRAVENOUS | Status: AC
  Administered 2020-10-19: 200 mg via INTRAVENOUS
  Filled 2020-10-19: qty 8

## 2020-10-19 MED ORDER — HEPARIN SOD (PORK) LOCK FLUSH 100 UNIT/ML IV SOLN
500.0000 [IU] | Freq: Once | INTRAVENOUS | Status: DC | PRN
  Filled 2020-10-19: qty 5

## 2020-10-19 MED ORDER — SODIUM CHLORIDE 0.9 % IV SOLN: 175.0000 mg | Freq: Once | INTRAVENOUS | Status: DC

## 2020-10-19 NOTE — Progress Notes (Signed)
Nutrition Follow-up: Patient with stage IV squamous cell lung cancer.  Patient receiving [name] only today.    Met with patient during infusion.  Patient reports that his appetite is about the same.  Sometimes eats 2 bananas for breakfast, sometimes nothing.  Wakes up about 5:30-6am.  Lunch is usually a sandwich. Dinner last night was 5 boneless wings, fries and drink.  Drinking about 2 ensure shakes per day. No nausea, taste alterations, constipation or diarrhea.      Medications: reviewed  Labs: reviewed  Anthropometrics:   Weight decreased to 136 lb 6.4 oz today  137 lb 4 oz on 3/21  145-151 lb UBW   NUTRITION DIAGNOSIS: Inadequate oral intake continues   INTERVENTION:  Recommend increasing ensure plus shakes to TID. Reports does not need any today. Recommend patient consistently eat something within 1-2hr of waking for added calories and protein    MONITORING, EVALUATION, GOAL: weight trends, intake   NEXT VISIT: Tuesday, May 3rd during infusion  Yittel Emrich B. Zenia Resides, [place], [place] Registered Dietitian [phone] (mobile)

## 2020-10-19 NOTE — Progress Notes (Signed)
HR 105 - ok to proceed per MD Only keytruda today no carbo/taxol Proceed with dex/benadryl as premeds

## 2020-10-19 NOTE — Progress Notes (Signed)
Hematology/Oncology Follow up note [name] Telephone:([phone] Fax:([phone]   Patient Care Team: Tracie Harrier, MD as PCP - General (Internal Medicine) Earlie Server, MD as Consulting Physician (Hematology and Oncology)  REASON FOR VISIT:  Follow up for treatment of squamous lung cancer.   HISTORY OF PRESENTING ILLNESS:  # Dec 2019 Stage I squamous lung cancer #s/p Bronchoscopy on 06/25/2018. subcarina EBUS FNA was non diagnostic, hypocellular specimen.  # 08/13/2018 CT guided right upper lobe biopsy pathology showed dense fibrosis and mixed inflammatory cells with prominent polytypic plasma cells component. Focal benign bronchial wall and alveolar spaces. No malignancy was identified.   # 08/13/2018 underwent right thoracotomy and wedge resection of a right upper lobe mass.  Frozen section was consistent with an inflammatory process.  On the second postop day, preliminary pathology reports high-grade malignancy.  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  08/20/2018 Patient was therefore taken back to the OR and underwent take complete lobectomy  pT1b pN0 cM0 stage I squamous cell lung cancer.  Margin is negative.  Recommend observation.    # 12/03/2018 CT chest w contrast showed local recurrence.  12/10/2018 PET showed No evidence of distant metastatic disease # 12/26/2018 s/p bronchoscopy biopsy. Confirmed local recurrence of squamous cell lung cancer.  medi port placed by Dr.Oaks.  # 06/14/2020 CT chest w contrast was reviewed and discussed with patient.  Evidence of progressive lymphangitic spread of tumor throughout the right lung, with contralateral lung nodules with  right pleural effusion  MRI brain is negative for CNS involvement.  CT abdomen with contrast showed no evidence of abdominal metastatic disease PET scan was not approved by insurance-peer to peer appeal with Dr.Vipul Bhanderi   # NGS - KRAS V14L,  TMB 26.6-high, MS stable, PD-L1  5% CDKN2C G48, DICER1c.2117-1G>T, FLT4 G1131S, NF1 A464f, NF1 L2643*, STK11 [id number] treatment Started concurrent chemoradiation on 01/16/2019 Carboplatin AUC of 2 and Taxol 45 mg/m2 weekly finished in 03/05/2019 04/10/2019, patient started on durvalumab maintenance. -CT showed progression. 07/16/2020 started on carboplatin/paclitaxel/Keytruda  INTERVAL HISTORY Samuel Choi a [age].o. male who has above history reviewed by me today presents for reatment of  recurrent  squamous cell lung cancer. Patient tolerates treatment.  No new complaints.  Chronic intermittent cough unchanged. He has had CT scan done during the interval.  Review of Systems  Constitutional: Positive for fatigue. Negative for appetite change, chills, fever and unexpected weight change.  HENT:   Negative for hearing loss and voice change.   Eyes: Negative for eye problems and icterus.  Respiratory: Positive for cough. Negative for chest tightness and shortness of breath.   Cardiovascular: Negative for chest pain and leg swelling.  Gastrointestinal: Negative for abdominal distention, abdominal pain and nausea.  Endocrine: Negative for hot flashes.  Genitourinary: Negative for difficulty urinating, dysuria and frequency.   Musculoskeletal: Negative for arthralgias.  Skin: Negative for itching and rash.  Neurological: Negative for light-headedness and numbness.  Hematological: Negative for adenopathy. Does not bruise/bleed easily.  Psychiatric/Behavioral: Negative for confusion.    MEDICAL HISTORY:  Past Medical History:  Diagnosis Date  . Alcohol abuse    usually drinks 2-3 drinks per day  . Atherosclerosis 06/2018  . Chronic sinusitis   . Dehydration 02/07/2019  . Emphysema of lung ([facility]   patient unaware of this.  . Hip fracture ([facility]   no surgery  . History of kidney stones 05/2018   per xray, bilateral nephrolitiasis  . Hypertension   .  Squamous cell carcinoma of lung, right ([place])  06/2018    SURGICAL HISTORY: Past Surgical History:  Procedure Laterality Date  . BRAIN SURGERY  10/2017   nasal/sinus endoscopy. mass benign  . ELECTROMAGNETIC NAVIGATION BROCHOSCOPY Right 06/25/2018   Procedure: ELECTROMAGNETIC NAVIGATION BRONCHOSCOPY;  Surgeon: [name], MD;  Location: [facility] ORS;  Service: Cardiopulmonary;  Laterality: Right;  . ENDOBRONCHIAL ULTRASOUND Right 06/25/2018   Procedure: ENDOBRONCHIAL ULTRASOUND;  Surgeon: [name], MD;  Location: [facility] OR[facility];  Service: Cardiopulmonary;  Laterality: Right;  . ENDOBRONCHIAL ULTRASOUND Right 12/26/2018   Procedure: ENDOBRONCHIAL ULTRASOUND RIGHT;  Surgeon: [name], MD;  Location: [facility] OR[facility];  Service: Cardiopulmonary;  Laterality: Right;  . FLEXIBLE BRONCHOSCOPY N/A 08/20/2018   Procedure: FLEXIBLE BRONCHOSCOPY PREOP;  Surgeon: [name], MD;  Location: [facility] OR[facility];  Service: Thoracic;  Laterality: N/A;  . IR CV LINE INJECTION  04/04/2019  . NASAL SINUS SURGERY  10/2017   [facility], frontal sinusotomy, ethmoidectomy, resection anterior cranial fossa neoplasm, turbinate resection  . PORTACATH PLACEMENT Left 01/15/2019   Procedure: INSERTION PORT-A-CATH;  Surgeon: [name], MD;  Location: [facility] OR[facility];  Service: General;  Laterality: Left;  . THORACOTOMY Right 08/13/2018   Procedure: PREOP BROCHOSCOPY WITH RIGHT THORACOTOMY AND RUL RESECTION;  Surgeon: [name], MD;  Location: [facility] OR[facility];  Service: General;  Laterality: Right;  . THORACOTOMY Right 08/20/2018   Procedure: THORACOTOMY MAJOR RIGHT UPPER LOBE LOBECTOMY;  Surgeon: [name], MD;  Location: [facility] OR[facility];  Service: Thoracic;  Laterality: Right;  . TOE SURGERY Left    pin in left toe    SOCIAL HISTORY: Social History   Socioeconomic History  . Marital status: Married    Spouse name: lisa  . Number of children: Not on file  . Years of education: Not on file  . Highest education level: Not on file  Occupational History  . Occupation: welding    Comment:  taking time off to resolve issues  Tobacco Use  . Smoking status: Former Smoker    Packs/day: 0.50    Years: 15.00    Pack years: 7.50    Types: Cigarettes    Quit date: 08/2018    Years since quitting: 2.1  . Smokeless tobacco: Never Used  Vaping Use  . Vaping Use: Never used  Substance and Sexual Activity  . Alcohol use: Yes    Alcohol/week: 3.0 standard drinks    Types: 3 Cans of beer per week    Comment: usually 2 drinks per week, per patient  . Drug use: No  . Sexual activity: Not on file  Other Topics Concern  . Not on file  Social History Narrative  . Not on file   Social Determinants of Health   Financial Resource Strain: Not on file  Food Insecurity: Not on file  Transportation Needs: Not on file  Physical Activity: Not on file  Stress: Not on file  Social Connections: Not on file  Intimate Partner Violence: Not on file    FAMILY HISTORY: Family History  Problem Relation Age of Onset  . Breast cancer Mother   . Diabetes Mother   . Lung cancer Father   . Hypertension Father     ALLERGIES:  has No Known Allergies.  MEDICATIONS:  Current Outpatient Medications  Medication Sig Dispense Refill  . albuterol (VENTOLIN HFA) 108 (90 Base) MCG/ACT inhaler TAKE 2 PUFFS BY MOUTH EVERY 6 HOURS AS NEEDED FOR WHEEZE OR SHORTNESS OF BREATH 6.7 each 6  . cloNIDine (CATAPRES) 0.1 MG tablet Take  0.1 mg by mouth daily.   5  . diltiazem (CARDIZEM CD) 120 MG 24 hr capsule Take 120 mg by mouth daily.    . folic acid (V-R FOLIC ACID) 412 MCG tablet Take 1 tablet (400 mcg total) by mouth daily. 90 tablet 1  . hydrALAZINE (APRESOLINE) 100 MG tablet Take 50 mg by mouth 2 (two) times daily.     . hydrochlorothiazide (HYDRODIURIL) 12.5 MG tablet Take 12.5 mg by mouth daily.     Marland Kitchen ibuprofen (ADVIL) 600 MG tablet Take 600 mg by mouth every 6 (six) hours.    Marland Kitchen KLOR-CON M20 20 MEQ tablet Take 2 tablets (40 mEq total) by mouth daily. 180 tablet 1  . latanoprost (XALATAN) 0.005 %  ophthalmic solution 1 drop at bedtime.    . lidocaine-prilocaine (EMLA) cream Apply to affected area once 30 g 3  . loratadine (CLARITIN) 10 MG tablet Take 10 mg by mouth daily.    . magnesium chloride (SLOW-MAG) 64 MG TBEC SR tablet Take 1 tablet (64 mg total) by mouth 2 (two) times daily. TAKE 1 TABLET (64 MG TOTAL) BY MOUTH DAILY. 60 tablet 1  . methocarbamol (ROBAXIN) 500 MG tablet Take 1 tablet (500 mg total) by mouth at bedtime. 30 tablet 0  . nystatin (MYCOSTATIN) 100000 UNIT/ML suspension Take 5 mLs (500,000 Units total) by mouth 4 (four) times daily. Swish and spit. 473 mL 0  . olmesartan (BENICAR) 40 MG tablet Take 40 mg by mouth every other day.     . ondansetron (ZOFRAN) 8 MG tablet Take 1 tablet (8 mg total) by mouth every 8 (eight) hours as needed for refractory nausea / vomiting. Start on day 3 after chemo. 90 tablet 3  . prochlorperazine (COMPAZINE) 10 MG tablet Take 1 tablet (10 mg total) by mouth every 6 (six) hours as needed (Nausea or vomiting). 90 tablet 3  . SPIRIVA HANDIHALER 18 MCG inhalation capsule INHALE 1 CAPSULE VIA HANDIHALER ONCE DAILY AT THE SAME TIME EVERY DAY 30 capsule 0  . SYMBICORT 80-4.5 MCG/ACT inhaler INHALE 2 PUFFS INTO THE LUNGS IN THE MORNING AND AT BEDTIME. 10.2 each 6  . vitamin B-12 (CYANOCOBALAMIN) 1000 MCG tablet Take 1 tablet (1,000 mcg total) by mouth daily. 90 tablet 1   No current facility-administered medications for this visit.   Facility-Administered Medications Ordered in Other Visits  Medication Dose Route Frequency Provider Last Rate Last Admin  . albuterol (PROVENTIL) (2.5 MG/3ML) 0.083% nebulizer solution 2.5 mg  2.5 mg Nebulization Once System, Provider Not In      . heparin lock flush 100 unit/mL  500 Units Intravenous Once Earlie Server, MD      . heparin lock flush 100 unit/mL  500 Units Intracatheter Once PRN Earlie Server, MD      . sodium chloride flush (NS) 0.9 % injection 10 mL  10 mL Intravenous PRN Earlie Server, MD   10 mL at 04/04/19 0903   . sodium chloride flush (NS) 0.9 % injection 10 mL  10 mL Intravenous PRN Earlie Server, MD   10 mL at 07/26/20 1308     PHYSICAL EXAMINATION: ECOG PERFORMANCE STATUS: 1 - Symptomatic but completely ambulatory Vitals:   10/19/20 0904  BP: 122/78  Pulse: (!) 105  Resp: 16  Temp: (!) 97.2 F (36.2 C)  SpO2: 96%   Filed Weights   10/19/20 0904  Weight: 136 lb 6.4 oz (61.9 kg)    Physical Exam Constitutional:      General: He is  not in acute distress. HENT:     Head: Normocephalic and atraumatic.  Eyes:     General: No scleral icterus. Cardiovascular:     Rate and Rhythm: Normal rate and regular rhythm.     Heart sounds: Normal heart sounds.  Pulmonary:     Effort: Pulmonary effort is normal. No respiratory distress.     Breath sounds: No wheezing.  Abdominal:     General: Bowel sounds are normal. There is no distension.     Palpations: Abdomen is soft.  Musculoskeletal:        General: No deformity. Normal range of motion.     Cervical back: Normal range of motion and neck supple.  Skin:    General: Skin is warm and dry.     Findings: No erythema or rash.  Neurological:     Mental Status: He is alert and oriented to person, place, and time. Mental status is at baseline.     Cranial Nerves: No cranial nerve deficit.     Coordination: Coordination normal.  Psychiatric:        Mood and Affect: Mood normal.      LABORATORY DATA:  I have reviewed the data as listed Lab Results  Component Value Date   WBC 13.5 (H) 10/19/2020   HGB 12.0 (L) 10/19/2020   HCT 33.7 (L) 10/19/2020   MCV 120.4 (H) 10/19/2020   PLT 136 (L) 10/19/2020   Recent Labs    03/17/20 0830 03/31/20 0825 04/07/20 0854 04/22/20 0825 08/31/20 0812 09/07/20 1117 09/21/20 0815 09/28/20 0846 10/19/20 0811  NA 134* 137 137   < > 137   < > 140 142 141  [place] 4.6 3.9 3.7   < > 3.1*   < > 3.4* 3.2* 3.1*  CL 101 102 104   < > 100   < > 105 106 102  CO2 20* 23 21*   < > 25   < > 21* 26 25  GLUCOSE  167* 126* 163*   < > 107*   < > 88 108* 78  BUN _0 < > 7   < > _1 CREATININE 1.19 1.10 1.10   < > 0.79   < > 0.92 0.94 0.71  CALCIUM 9.0 9.1 8.8*   < > 8.9   < > 8.9 8.7* 9.0  GFRNONAA >60 >60 >60   < > >60   < > >60 >60 >60  GFRAA >60 >60 >60  --   --   --   --   --   --   PROT 7.7 7.6 7.1   < > 7.4   < > 7.3 7.2 7.9  ALBUMIN 3.5 3.5 3.4*   < > 3.4*   < > 3.4* 3.3* 3.7  AST 32 29 25   < > 31   < > _2 ALT _3 < > 13   < > _4 ALKPHOS 86 87 76   < > 101   < > 98 87 106  BILITOT 0.8 0.7 0.5   < > 2.0*   < > 0.7 0.7 0.6  BILIDIR  --   --   --   --  0.4*  --   --   --   --    < > = values in this interval not displayed.   Iron/TIBC/Ferritin/ %Sat No results found for: IRON, TIBC, FERRITIN, IRONPCTSAT  RADIOGRAPHIC STUDIES: I have personally reviewed the radiological images as listed and agreed with the findings in the report. CT CHEST ABDOMEN PELVIS W CONTRAST  Result Date: 10/16/2020 CLINICAL DATA:  Lung cancer, restaging, assess treatment response EXAM: CT CHEST, ABDOMEN, AND PELVIS WITH CONTRAST TECHNIQUE: Multidetector CT imaging of the chest, abdomen and pelvis was performed following the standard protocol during bolus administration of intravenous contrast. CONTRAST:  172m OMNIPAQUE IOHEXOL 300 MG/ML SOLN, additional oral enteric contrast COMPARISON:  07/06/2020 FINDINGS: CT CHEST FINDINGS Cardiovascular: Left chest port catheter. Aortic atherosclerosis. Normal heart size. Left coronary artery calcifications. No pericardial effusion. Mediastinum/Nodes: Unchanged soft thickening about the right hilum. No discretely enlarged mediastinal, hilar, or axillary lymph nodes. Thyroid gland, trachea, and esophagus demonstrate no significant findings. Lungs/Pleura: Redemonstrated postoperative and post appearance of the right chest status post right upper lobectomy with perihilar consolidation and fibrosis. Slight interval decrease in size of pulmonary nodules  associated with interlobular septal thickening in the right lung, an index nodule measuring 0.7 x 0.4 cm, previously 1.1 x 0.6 cm (series 4, image 55). There has been nearly complete resolution of previously seen left-sided pulmonary nodules, with minimal irregular residua, for example in the left lower lobe measuring approximately 2 mm, previously 5 mm (series 4, image 104). Trace right pleural effusion, improved compared to prior examination. Musculoskeletal: No chest wall mass or suspicious bone lesions identified. CT ABDOMEN PELVIS FINDINGS Hepatobiliary: No solid liver abnormality is seen. No gallstones, gallbladder wall thickening, or biliary dilatation. Pancreas: Unremarkable. No pancreatic ductal dilatation or surrounding inflammatory changes. Spleen: Normal in size without significant abnormality. Adrenals/Urinary Tract: Adrenal glands are unremarkable. Multiple punctuate bilateral nonobstructive renal calculi. No ureteral calculi or hydronephrosis. Bladder is unremarkable. Stomach/Bowel: Stomach is within normal limits. Appendix appears normal. No evidence of bowel wall thickening, distention, or inflammatory changes. Descending and sigmoid diverticulosis. Vascular/Lymphatic: Aortic atherosclerosis. No enlarged abdominal or pelvic lymph nodes. Reproductive: No mass or other abnormality. Other: No abdominal wall hernia or abnormality. No abdominopelvic ascites. Musculoskeletal: No acute or significant osseous findings. IMPRESSION: 1. Redemonstrated postoperative and post appearance of the right chest status post right upper lobectomy with perihilar consolidation and fibrosis. 2. Slight interval decrease in size of pulmonary nodules associated with interlobular septal thickening in the right lung. 3. There has been nearly complete resolution of previously seen left-sided pulmonary nodules, with minimal irregular residua. 4. Trace right pleural effusion, improved compared to prior examination. 5. Findings are  consistent with treatment response of pulmonary metastatic disease. 6. No evidence of metastatic disease within the abdomen or pelvis. 7. Nonobstructive bilateral nephrolithiasis. 8. Coronary artery disease. Aortic Atherosclerosis (ICD10-I70.0). Electronically Signed   By: AEddie CandleM.D.   On: 10/16/2020 21:01     ASSESSMENT & PLAN:  1. Squamous cell carcinoma of lung, right ([facility]   2. Hypomagnesemia   3. Hypokalemia   4. Encounter for antineoplastic immunotherapy   5. Chemotherapy induced neutropenia (HCC)    #Recurrent Squamous cell carcinoma of lung, stage IV On palliative chemotherapy with carboplatin Taxol and Keytruda, status post 4 cycles. 10/15/2020, CT chest abdomen pelvis redemonstrated postoperative and postradiation appearance of the right chest with perihilar consolidation and fibrosis.  Slight interval decrease in size of the pulmonary nodules associated with interlobular septal thickening nearly complete resolution of previously seen left-sided pulmonary nodules.  Minimal irregular residual.  Right pleural effusion improved.  Consistent with treatment response.  No evidence of metastatic disease with the abdomen or pelvis.  None obstructive bilateral nephrolithiasis Patient has  partial response to the treatment Recommend patient to go on Keytruda maintenance.  Close monitor disease status.  Patient agrees with the plan. Labs reviewed and discussed with patient.  Proceed with Keytruda today.  #Chemotherapy-induced nausea, resolved.  Antiemetics as needed. #Chronic hypomagnesia.  Continue slow magnesium twice daily.   #Chronic hypokalemia, potassium 3.1 today.  Advised patient to continue potassium chloride 20 mEq twice daily.   #Chemotherapy-induced thrombocytopenia, mild. Return of visit:  Follow-up in 3 weeks.   Earlie Server, MD, PhD Hematology Oncology [facility] 10/19/2020

## 2020-10-21 ENCOUNTER — Inpatient Hospital Stay: Payer: 59

## 2020-11-08 ENCOUNTER — Other Ambulatory Visit: Payer: Self-pay | Admitting: *Deleted

## 2020-11-08 MED ORDER — ALBUTEROL SULFATE HFA 108 (90 BASE) MCG/ACT IN AERS: INHALATION_SPRAY | RESPIRATORY_TRACT | 6 refills | Status: DC

## 2020-11-09 ENCOUNTER — Inpatient Hospital Stay: Payer: 59 | Attending: Oncology

## 2020-11-09 ENCOUNTER — Encounter: Payer: Self-pay | Admitting: Oncology

## 2020-11-09 ENCOUNTER — Inpatient Hospital Stay: Payer: 59

## 2020-11-09 ENCOUNTER — Inpatient Hospital Stay (HOSPITAL_BASED_OUTPATIENT_CLINIC_OR_DEPARTMENT_OTHER): Payer: 59 | Admitting: Oncology

## 2020-11-09 VITALS — BP 151/93 | HR 103 | Temp 98.8°F | Resp 16 | Wt 137.6 lb

## 2020-11-09 VITALS — BP 146/89 | HR 96 | Resp 18

## 2020-11-09 DIAGNOSIS — Z803 Family history of malignant neoplasm of breast: Secondary | ICD-10-CM | POA: Insufficient documentation

## 2020-11-09 DIAGNOSIS — C3491 Malignant neoplasm of unspecified part of right bronchus or lung: Secondary | ICD-10-CM

## 2020-11-09 DIAGNOSIS — E876 Hypokalemia: Secondary | ICD-10-CM | POA: Diagnosis not present

## 2020-11-09 DIAGNOSIS — D701 Agranulocytosis secondary to cancer chemotherapy: Secondary | ICD-10-CM

## 2020-11-09 DIAGNOSIS — T451X5A Adverse effect of antineoplastic and immunosuppressive drugs, initial encounter: Secondary | ICD-10-CM | POA: Diagnosis not present

## 2020-11-09 DIAGNOSIS — Z8249 Family history of ischemic heart disease and other diseases of the circulatory system: Secondary | ICD-10-CM | POA: Diagnosis not present

## 2020-11-09 DIAGNOSIS — Z902 Acquired absence of lung [part of]: Secondary | ICD-10-CM | POA: Diagnosis not present

## 2020-11-09 DIAGNOSIS — Z5112 Encounter for antineoplastic immunotherapy: Secondary | ICD-10-CM | POA: Insufficient documentation

## 2020-11-09 DIAGNOSIS — Z79899 Other long term (current) drug therapy: Secondary | ICD-10-CM | POA: Diagnosis not present

## 2020-11-09 DIAGNOSIS — D6959 Other secondary thrombocytopenia: Secondary | ICD-10-CM | POA: Diagnosis not present

## 2020-11-09 DIAGNOSIS — Z87891 Personal history of nicotine dependence: Secondary | ICD-10-CM | POA: Diagnosis not present

## 2020-11-09 DIAGNOSIS — R059 Cough, unspecified: Secondary | ICD-10-CM

## 2020-11-09 DIAGNOSIS — Z7951 Long term (current) use of inhaled steroids: Secondary | ICD-10-CM | POA: Insufficient documentation

## 2020-11-09 DIAGNOSIS — Z801 Family history of malignant neoplasm of trachea, bronchus and lung: Secondary | ICD-10-CM | POA: Diagnosis not present

## 2020-11-09 DIAGNOSIS — C3411 Malignant neoplasm of upper lobe, right bronchus or lung: Secondary | ICD-10-CM | POA: Insufficient documentation

## 2020-11-09 DIAGNOSIS — Z5111 Encounter for antineoplastic chemotherapy: Secondary | ICD-10-CM

## 2020-11-09 LAB — COMPREHENSIVE METABOLIC PANEL
ALT: 14 U/L (ref 0–44)
AST: 25 U/L (ref 15–41)
Albumin: 3.5 g/dL (ref 3.5–5.0)
Alkaline Phosphatase: 83 U/L (ref 38–126)
Anion gap: 13 (ref 5–15)
BUN: 6 mg/dL (ref 6–20)
CO2: 24 mmol/L (ref 22–32)
Calcium: 8.9 mg/dL (ref 8.9–10.3)
Chloride: 98 mmol/L (ref 98–111)
Creatinine, Ser: 0.86 mg/dL (ref 0.61–1.24)
GFR, Estimated: >60 mL/min (ref >60)
Glucose, Bld: 163 mg/dL — ABNORMAL HIGH (ref 70–99)
Potassium: 3.1 mmol/L — ABNORMAL LOW (ref 3.5–5.1)
Sodium: 135 mmol/L (ref 135–145)
Total Bilirubin: 1.6 mg/dL — ABNORMAL HIGH (ref 0.3–1.2)
Total Protein: 7.3 g/dL (ref 6.5–8.1)

## 2020-11-09 LAB — CBC WITH DIFFERENTIAL/PLATELET
Abs Immature Granulocytes: 0.02 10*3/uL (ref 0.00–0.07)
Basophils Absolute: 0 10*3/uL (ref 0.0–0.1)
Basophils Relative: 0 %
Eosinophils Absolute: 0.2 10*3/uL (ref 0.0–0.5)
Eosinophils Relative: 2 %
HCT: 36.7 % — ABNORMAL LOW (ref 39.0–52.0)
Hemoglobin: 13 g/dL (ref 13.0–17.0)
Immature Granulocytes: 0 %
Lymphocytes Relative: 9 %
Lymphs Abs: 0.9 10*3/uL (ref 0.7–4.0)
MCH: 42.2 pg — ABNORMAL HIGH (ref 26.0–34.0)
MCHC: 35.4 g/dL (ref 30.0–36.0)
MCV: 119.2 fL — ABNORMAL HIGH (ref 80.0–100.0)
Monocytes Absolute: 1 10*3/uL (ref 0.1–1.0)
Monocytes Relative: 10 %
Neutro Abs: 8 10*3/uL — ABNORMAL HIGH (ref 1.7–7.7)
Neutrophils Relative %: 79 %
Platelets: 126 10*3/uL — ABNORMAL LOW (ref 150–400)
RBC: 3.08 MIL/uL — ABNORMAL LOW (ref 4.22–5.81)
RDW: 12 % (ref 11.5–15.5)
WBC: 10.1 10*3/uL (ref 4.0–10.5)
nRBC: 0 % (ref 0.0–0.2)

## 2020-11-09 LAB — TSH: TSH: 0.583 u[IU]/mL (ref 0.350–4.500)

## 2020-11-09 MED ORDER — SODIUM CHLORIDE 0.9 % IV SOLN
200.0000 mg | Freq: Once | INTRAVENOUS | Status: AC
  Administered 2020-11-09: 200 mg via INTRAVENOUS
  Filled 2020-11-09: qty 8

## 2020-11-09 MED ORDER — LORAZEPAM 2 MG/ML IJ SOLN
0.5000 mg | Freq: Once | INTRAMUSCULAR | Status: AC | PRN
  Administered 2020-11-09: 0.5 mg via INTRAVENOUS
  Filled 2020-11-09: qty 1

## 2020-11-09 MED ORDER — SODIUM CHLORIDE 0.9 % IV SOLN
10.0000 mg | Freq: Once | INTRAVENOUS | Status: AC
  Administered 2020-11-09: 10 mg via INTRAVENOUS
  Filled 2020-11-09: qty 10

## 2020-11-09 MED ORDER — HEPARIN SOD (PORK) LOCK FLUSH 100 UNIT/ML IV SOLN
500.0000 [IU] | Freq: Once | INTRAVENOUS | Status: AC | PRN
  Administered 2020-11-09: 500 [IU]
  Filled 2020-11-09: qty 5

## 2020-11-09 MED ORDER — HEPARIN SOD (PORK) LOCK FLUSH 100 UNIT/ML IV SOLN
INTRAVENOUS | Status: AC
  Filled 2020-11-09: qty 5

## 2020-11-09 MED ORDER — DIPHENHYDRAMINE HCL 50 MG/ML IJ SOLN
25.0000 mg | Freq: Once | INTRAMUSCULAR | Status: AC
  Administered 2020-11-09: 25 mg via INTRAVENOUS
  Filled 2020-11-09: qty 1

## 2020-11-09 MED ORDER — POTASSIUM CHLORIDE 20 MEQ/100ML IV SOLN
20.0000 meq | Freq: Once | INTRAVENOUS | Status: AC
  Administered 2020-11-09: 20 meq via INTRAVENOUS

## 2020-11-09 MED ORDER — SODIUM CHLORIDE 0.9 % IV SOLN
Freq: Once | INTRAVENOUS | Status: AC
  Filled 2020-11-09: qty 250

## 2020-11-09 NOTE — Patient Instructions (Signed)
[facility]    Discharge Instructions:  Thank you for choosing [facility] to provide your oncology and hematology care.  If you have a lab appointment with the [facility], please go directly to the [facility] and check in at the registration area.  Wear comfortable clothing and clothing appropriate for easy access to any Portacath or PICC line.   We strive to give you quality time with your provider. You may need to reschedule your appointment if you arrive late (15 or more minutes).  Arriving late affects you and other patients whose appointments are after yours.  Also, if you miss three or more appointments without notifying the office, you may be dismissed from the clinic at the provider's discretion.      For prescription refill requests, have your pharmacy contact our office and allow 72 hours for refills to be completed.    Today you received the following chemotherapy and/or immunotherapy agents: Pembrolizumab (Keytruda). Today you also received the following treatment: Potassium Chloride.       To help prevent nausea and vomiting after your treatment, we encourage you to take your nausea medication as directed.  BELOW ARE SYMPTOMS THAT SHOULD BE REPORTED IMMEDIATELY: . *FEVER GREATER THAN 100.4 F (38 C) OR HIGHER . *CHILLS OR SWEATING . *NAUSEA AND VOMITING THAT IS NOT CONTROLLED WITH YOUR NAUSEA MEDICATION . *UNUSUAL SHORTNESS OF BREATH . *UNUSUAL BRUISING OR BLEEDING . *URINARY PROBLEMS (pain or burning when urinating, or frequent urination) . *BOWEL PROBLEMS (unusual diarrhea, constipation, pain near the anus) . TENDERNESS IN MOUTH AND THROAT WITH OR WITHOUT PRESENCE OF ULCERS (sore throat, sores in mouth, or a toothache) . UNUSUAL RASH, SWELLING OR PAIN  . UNUSUAL VAGINAL DISCHARGE OR ITCHING   Items with * indicate a potential emergency and should be followed up as soon as possible or go to the Emergency Department  if any problems should occur.  Please show the CHEMOTHERAPY ALERT CARD or IMMUNOTHERAPY ALERT CARD at check-in to the Emergency Department and triage nurse.  Should you have questions after your visit or need to cancel or reschedule your appointment, please contact [facility]  [phone] and follow the prompts.  Office hours are 8:00 a.m. to 4:30 p.m. Monday - Friday. Please note that voicemails left after 4:00 p.m. may not be returned until the following business day.  We are closed weekends and major holidays. You have access to a nurse at all times for urgent questions. Please call the main number to the clinic [phone] and follow the prompts.  For any non-urgent questions, you may also contact your provider using MyChart. We now offer e-Visits for anyone 9 and older to request care online for non-urgent symptoms. For details visit [email or web address].   Also download the MyChart app! Go to the app store, search "MyChart", open the app, select [facility], and log in with your MyChart username and password.  Due to Covid, a mask is required upon entering the hospital/clinic. If you do not have a mask, one will be given to you upon arrival. For doctor visits, patients may have 1 support person aged 54 or older with them. For treatment visits, patients cannot have anyone with them due to current Covid guidelines and our immunocompromised population.   Pembrolizumab injection  What is this medicine? PEMBROLIZUMAB (pem broe liz ue mab) is a monoclonal antibody. It is used to treat certain types of cancer. This medicine may be used  for other purposes; ask your health care provider or pharmacist if you have questions. COMMON BRAND NAME(S): Keytruda What should I tell my health care provider before I take this medicine? They need to know if you have any of these conditions:  autoimmune diseases like Crohn's disease, ulcerative colitis, or lupus  have  had or planning to have an allogeneic stem cell transplant (uses someone else's stem cells)  history of organ transplant  history of chest radiation  nervous system problems like myasthenia gravis or Guillain-Barre syndrome  an unusual or allergic reaction to pembrolizumab, other medicines, foods, dyes, or preservatives  pregnant or trying to get pregnant  breast-feeding How should I use this medicine? This medicine is for infusion into a vein. It is given by a health care professional in a hospital or clinic setting. A special MedGuide will be given to you before each treatment. Be sure to read this information carefully each time. Talk to your pediatrician regarding the use of this medicine in children. While this drug may be prescribed for children as young as 6 months for selected conditions, precautions do apply. Overdosage: If you think you have taken too much of this medicine contact a poison control center or emergency room at once. NOTE: This medicine is only for you. Do not share this medicine with others. What if I miss a dose? It is important not to miss your dose. Call your doctor or health care professional if you are unable to keep an appointment. What may interact with this medicine? Interactions have not been studied. This list may not describe all possible interactions. Give your health care provider a list of all the medicines, herbs, non-prescription drugs, or dietary supplements you use. Also tell them if you smoke, drink alcohol, or use illegal drugs. Some items may interact with your medicine. What should I watch for while using this medicine? Your condition will be monitored carefully while you are receiving this medicine. You may need blood work done while you are taking this medicine. Do not become pregnant while taking this medicine or for 4 months after stopping it. Women should inform their doctor if they wish to become pregnant or think they might be pregnant.  There is a potential for serious side effects to an unborn child. Talk to your health care professional or pharmacist for more information. Do not breast-feed an infant while taking this medicine or for 4 months after the last dose. What side effects may I notice from receiving this medicine? Side effects that you should report to your doctor or health care professional as soon as possible:  allergic reactions like skin rash, itching or hives, swelling of the face, lips, or tongue  bloody or black, tarry  breathing problems  changes in vision  chest pain  chills  confusion  constipation  cough  diarrhea  dizziness or feeling faint or lightheaded  fast or irregular heartbeat  fever  flushing  joint pain  low blood counts - this medicine may decrease the number of white blood cells, red blood cells and platelets. You may be at increased risk for infections and bleeding.  muscle pain  muscle weakness  pain, tingling, numbness in the hands or feet  persistent headache  redness, blistering, peeling or loosening of the skin, including inside the mouth  signs and symptoms of high blood sugar such as dizziness; dry mouth; dry skin; fruity breath; nausea; stomach pain; increased hunger or thirst; increased urination  signs and symptoms of kidney  injury like trouble passing urine or change in the amount of urine  signs and symptoms of liver injury like dark urine, light-colored stools, loss of appetite, nausea, right upper belly pain, yellowing of the eyes or skin  sweating  swollen lymph nodes  weight loss Side effects that usually do not require medical attention (report to your doctor or health care professional if they continue or are bothersome):  decreased appetite  hair loss  tiredness This list may not describe all possible side effects. Call your doctor for medical advice about side effects. You may report side effects to FDA at 1-800-FDA-1088. Where  should I keep my medicine? This drug is given in a hospital or clinic and will not be stored at home. NOTE: This sheet is a summary. It may not cover all possible information. If you have questions about this medicine, talk to your doctor, pharmacist, or health care provider.  2021 Elsevier/Gold Standard (2019-05-28 21:44:53)  Potassium chloride injection  What is this medicine? POTASSIUM CHLORIDE (poe TASS i um KLOOR ide) is a potassium supplement used to prevent and to treat low potassium. Potassium is important for the heart, muscles, and nerves. Too much or too little potassium in the body can cause serious problems. This medicine may be used for other purposes; ask your health care provider or pharmacist if you have questions. COMMON BRAND NAME(S): PROAMP What should I tell my health care provider before I take this medicine? They need to know if you have any of these conditions:  Addison disease  dehydration  diabetes (high blood sugar)  heart disease  high levels of potassium in the blood  irregular heartbeat or rhythm  kidney disease  large areas of burned skin  an unusual or allergic reaction to potassium, other medicines, foods, dyes, or preservatives  pregnant or trying to get pregnant  breast-feeding How should I use this medicine? This medicine is injected into a vein. It is given by a health care provider in a hospital or clinic setting. Talk to your health care provider about the use of this medicine in children. Special care may be needed. Overdosage: If you think you have taken too much of this medicine contact a poison control center or emergency room at once. NOTE: This medicine is only for you. Do not share this medicine with others. What if I miss a dose? This does not apply. This medicine is not for regular use. What may interact with this medicine? Do not take this medicine with any of the following medications:  certain diuretics such as  spironolactone, triamterene  eplerenone  sodium polystyrene sulfonate This medicine may also interact with the following medications:  certain medicines for blood pressure or heart disease like lisinopril, losartan, quinapril, valsartan  medicines that lower your chance of fighting infection such as cyclosporine, tacrolimus  NSAIDs, medicines for pain and inflammation, like ibuprofen or naproxen  other potassium supplements  salt substitutes This list may not describe all possible interactions. Give your health care provider a list of all the medicines, herbs, non-prescription drugs, or dietary supplements you use. Also tell them if you smoke, drink alcohol, or use illegal drugs. Some items may interact with your medicine. What should I watch for while using this medicine? Visit your health care provider for regular checks on your progress. Tell your health care provider if your symptoms do not start to get better or if they get worse. You may need blood work while you are taking this medicine. Avoid  salt substitutes unless you are told otherwise by your health care provider. What side effects may I notice from receiving this medicine? Side effects that you should report to your doctor or health care professional as soon as possible:  allergic reactions (skin rash, itching, hives, swelling of the face, lips, tongue, or throat)  confusion  high potassium levels (muscle weakness, fast or irregular heartbeat)  low blood pressure (dizziness, feeling faint or lightheaded, blurry vision)  pain, tingling, or numbness in lips, hands, or feet  pain, redness, or irritation at site where injected  trouble breathing Side effects that usually do not require medical attention (report to your doctor or health care professional if they continue or are bothersome):  diarrhea  nausea, vomiting  passing gas  stomach pain This list may not describe all possible side effects. Call your  doctor for medical advice about side effects. You may report side effects to FDA at 1-800-FDA-1088. Where should I keep my medicine? This medicine is given in a hospital or clinic. It will not be stored at home. NOTE: This sheet is a summary. It may not cover all possible information. If you have questions about this medicine, talk to your doctor, pharmacist, or health care provider.  2021 Elsevier/Gold Standard (2019-04-24 18:18:09)

## 2020-11-09 NOTE — Progress Notes (Signed)
Hematology/Oncology Follow up note [facility] Telephone:([phone] [phone]   Patient Care Team: Tracie Harrier, MD as PCP - General (Internal Medicine) Earlie Server, MD as Consulting Physician (Hematology and Oncology)  REASON FOR VISIT:  Follow up for treatment of squamous lung cancer.   HISTORY OF PRESENTING ILLNESS:  # Dec 2019 Stage I squamous lung cancer #s/p Bronchoscopy on 06/25/2018. subcarina EBUS FNA was non diagnostic, hypocellular specimen.  # 08/13/2018 CT guided right upper lobe biopsy pathology showed dense fibrosis and mixed inflammatory cells with prominent polytypic plasma cells component. Focal benign bronchial wall and alveolar spaces. No malignancy was identified.   # 08/13/2018 underwent right thoracotomy and wedge resection of a right upper lobe mass.  Frozen section was consistent with an inflammatory process.  On the second postop day, preliminary pathology reports high-grade malignancy.  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  08/20/2018 Patient was therefore taken back to the OR and underwent take complete lobectomy  pT1b pN0 cM0 stage I squamous cell lung cancer.  Margin is negative.  Recommend observation.    # 12/03/2018 CT chest w contrast showed local recurrence.  12/10/2018 PET showed No evidence of distant metastatic disease # 12/26/2018 s/p bronchoscopy biopsy. Confirmed local recurrence of squamous cell lung cancer.  medi port placed by Dr.Oaks.  # 06/14/2020 CT chest w contrast was reviewed and discussed with patient.  Evidence of progressive lymphangitic spread of tumor throughout the right lung, with contralateral lung nodules with  right pleural effusion  MRI brain is negative for CNS involvement.  CT abdomen with contrast showed no evidence of abdominal metastatic disease PET scan was not approved by insurance-peer to peer appeal with Dr.Vipul Bhanderi   # NGS - KRAS V14L,  TMB 26.6-high, MS stable, PD-L1  5% CDKN2C G48, DICER1c.2117-1G>T, FLT4 G1131S, NF1 A459f, NF1 L2643*, STK11 N1817fCancer treatment Started concurrent chemoradiation on 01/16/2019 Carboplatin AUC of 2 and Taxol 45 mg/m2 weekly finished in 03/05/2019 04/10/2019, patient started on durvalumab maintenance. -CT showed progression. 07/16/2020-09/28/2020 4 cycles of carboplatin/paclitaxel/Keytruda  10/15/2020, CT chest abdomen pelvis redemonstrated postoperative and postradiation appearance of the right chest with perihilar consolidation and fibrosis.  Slight interval decrease in size of the pulmonary nodules associated with interlobular septal thickening nearly complete resolution of previously seen left-sided pulmonary nodules.  Minimal irregular residual.  Right pleural effusion improved.  Consistent with treatment response.  No evidence of metastatic disease with the abdomen or pelvis.  None obstructive bilateral nephrolithiasis - partial response to the treatment- 10/19/2020, Keytruda monotherapy maintenance  INTERVAL HISTORY Samuel KRIDERs a [age].o. male who has above history reviewed by me today presents for reatment of  recurrent  squamous cell lung cancer. Patient has no new complaints.   Chronic intermittent cough unchanged. His weight has been stable.  Review of Systems  Constitutional: Positive for fatigue. Negative for appetite change, chills, fever and unexpected weight change.  HENT:   Negative for hearing loss and voice change.   Eyes: Negative for eye problems and icterus.  Respiratory: Positive for cough. Negative for chest tightness and shortness of breath.   Cardiovascular: Negative for chest pain and leg swelling.  Gastrointestinal: Negative for abdominal distention, abdominal pain and nausea.  Endocrine: Negative for hot flashes.  Genitourinary: Negative for difficulty urinating, dysuria and frequency.   Musculoskeletal: Negative for arthralgias.  Skin: Negative for itching and rash.  Neurological: Negative  for light-headedness and numbness.  Hematological: Negative for adenopathy. Does not bruise/bleed easily.  Psychiatric/Behavioral: Negative for confusion.    MEDICAL HISTORY:  Past Medical History:  Diagnosis Date  . Alcohol abuse    usually drinks 2-3 drinks per day  . Atherosclerosis 06/2018  . Chronic sinusitis   . Dehydration 02/07/2019  . Emphysema of lung ([place]) 06/2018   patient unaware of this.  . Hip fracture ([place]) 05/2018   no surgery  . History of kidney stones 05/2018   per xray, bilateral nephrolitiasis  . Hypertension   . Squamous cell carcinoma of lung, right ([place]) 06/2018    SURGICAL HISTORY: Past Surgical History:  Procedure Laterality Date  . BRAIN SURGERY  10/2017   nasal/sinus endoscopy. mass benign  . ELECTROMAGNETIC NAVIGATION BROCHOSCOPY Right 06/25/2018   Procedure: ELECTROMAGNETIC NAVIGATION BRONCHOSCOPY;  Surgeon: [name], MD;  Location: [facility] ORS;  Service: Cardiopulmonary;  Laterality: Right;  . ENDOBRONCHIAL ULTRASOUND Right 06/25/2018   Procedure: ENDOBRONCHIAL ULTRASOUND;  Surgeon: [name], MD;  Location: [facility] ORS;  Service: Cardiopulmonary;  Laterality: Right;  . ENDOBRONCHIAL ULTRASOUND Right 12/26/2018   Procedure: ENDOBRONCHIAL ULTRASOUND RIGHT;  Surgeon: [name], MD;  Location: [facility] ORS;  Service: Cardiopulmonary;  Laterality: Right;  . FLEXIBLE BRONCHOSCOPY N/A 08/20/2018   Procedure: FLEXIBLE BRONCHOSCOPY PREOP;  Surgeon: [name], MD;  Location: [facility] ORS;  Service: Thoracic;  Laterality: N/A;  . IR CV LINE INJECTION  04/04/2019  . NASAL SINUS SURGERY  10/2017   [facility], frontal sinusotomy, ethmoidectomy, resection anterior cranial fossa neoplasm, turbinate resection  . PORTACATH PLACEMENT Left 01/15/2019   Procedure: INSERTION PORT-A-CATH;  Surgeon: [name], MD;  Location: [facility] ORS;  Service: General;  Laterality: Left;  . THORACOTOMY Right 08/13/2018   Procedure: PREOP BROCHOSCOPY WITH RIGHT THORACOTOMY AND RUL  RESECTION;  Surgeon: [name], MD;  Location: [facility] ORS;  Service: General;  Laterality: Right;  . THORACOTOMY Right 08/20/2018   Procedure: THORACOTOMY MAJOR RIGHT UPPER LOBE LOBECTOMY;  Surgeon: [name], MD;  Location: [facility] ORS;  Service: Thoracic;  Laterality: Right;  . TOE SURGERY Left    pin in left toe    SOCIAL HISTORY: Social History   Socioeconomic History  . Marital status: Married    Spouse name: lisa  . Number of children: Not on file  . Years of education: Not on file  . Highest education level: Not on file  Occupational History  . Occupation: welding    Comment: taking time off to resolve issues  Tobacco Use  . Smoking status: Former Smoker    Packs/day: 0.50    Years: 15.00    Pack years: 7.50    Types: Cigarettes    Quit date: 08/2018    Years since quitting: 2.2  . Smokeless tobacco: Never Used  Vaping Use  . Vaping Use: Never used  Substance and Sexual Activity  . Alcohol use: Yes    Alcohol/week: 3.0 standard drinks    Types: 3 Cans of beer per week    Comment: usually 2 drinks per week, per patient  . Drug use: No  . Sexual activity: Not on file  Other Topics Concern  . Not on file  Social History Narrative  . Not on file   Social Determinants of Health   Financial Resource Strain: Not on file  Food Insecurity: Not on file  Transportation Needs: Not on file  Physical Activity: Not on file  Stress: Not on file  Social Connections: Not on file  Intimate Partner Violence: Not on file    FAMILY HISTORY: Family History  Problem  Relation Age of Onset  . Breast cancer Mother   . Diabetes Mother   . Lung cancer Father   . Hypertension Father     ALLERGIES:  has No Known Allergies.  MEDICATIONS:  Current Outpatient Medications  Medication Sig Dispense Refill  . albuterol (VENTOLIN HFA) 108 (90 Base) MCG/ACT inhaler TAKE 2 PUFFS BY MOUTH EVERY 6 HOURS AS NEEDED FOR WHEEZE OR SHORTNESS OF BREATH 6.7 each 6  . cloNIDine (CATAPRES)  0.1 MG tablet Take 0.1 mg by mouth daily.   5  . diltiazem (CARDIZEM CD) 120 MG 24 hr capsule Take 120 mg by mouth daily.    . folic acid (V-R FOLIC ACID) 469 MCG tablet Take 1 tablet (400 mcg total) by mouth daily. 90 tablet 1  . hydrALAZINE (APRESOLINE) 100 MG tablet Take 50 mg by mouth 2 (two) times daily.     . hydrochlorothiazide (HYDRODIURIL) 12.5 MG tablet Take 12.5 mg by mouth daily.     Marland Kitchen ibuprofen (ADVIL) 600 MG tablet Take 600 mg by mouth every 6 (six) hours.    Marland Kitchen KLOR-CON M20 20 MEQ tablet Take 2 tablets (40 mEq total) by mouth daily. 180 tablet 1  . latanoprost (XALATAN) 0.005 % ophthalmic solution 1 drop at bedtime.    . lidocaine-prilocaine (EMLA) cream Apply to affected area once 30 g 3  . loratadine (CLARITIN) 10 MG tablet Take 10 mg by mouth daily.    . magnesium chloride (SLOW-MAG) 64 MG TBEC SR tablet Take 1 tablet (64 mg total) by mouth 2 (two) times daily. TAKE 1 TABLET (64 MG TOTAL) BY MOUTH DAILY. 60 tablet 1  . methocarbamol (ROBAXIN) 500 MG tablet Take 1 tablet (500 mg total) by mouth at bedtime. 30 tablet 0  . nystatin (MYCOSTATIN) 100000 UNIT/ML suspension Take 5 mLs (500,000 Units total) by mouth 4 (four) times daily. Swish and spit. 473 mL 0  . olmesartan (BENICAR) 40 MG tablet Take 40 mg by mouth every other day.     . ondansetron (ZOFRAN) 8 MG tablet Take 1 tablet (8 mg total) by mouth every 8 (eight) hours as needed for refractory nausea / vomiting. Start on day 3 after chemo. 90 tablet 3  . prochlorperazine (COMPAZINE) 10 MG tablet Take 1 tablet (10 mg total) by mouth every 6 (six) hours as needed (Nausea or vomiting). 90 tablet 3  . SPIRIVA HANDIHALER 18 MCG inhalation capsule INHALE 1 CAPSULE VIA HANDIHALER ONCE DAILY AT THE SAME TIME EVERY DAY 30 capsule 0  . SYMBICORT 80-4.5 MCG/ACT inhaler INHALE 2 PUFFS INTO THE LUNGS IN THE MORNING AND AT BEDTIME. 10.2 each 6  . vitamin B-12 (CYANOCOBALAMIN) 1000 MCG tablet Take 1 tablet (1,000 mcg total) by mouth daily. 90  tablet 1   No current facility-administered medications for this visit.   Facility-Administered Medications Ordered in Other Visits  Medication Dose Route Frequency Provider Last Rate Last Admin  . albuterol (PROVENTIL) (2.5 MG/3ML) 0.083% nebulizer solution 2.5 mg  2.5 mg Nebulization Once System, Provider Not In      . heparin lock flush 100 unit/mL  500 Units Intravenous Once Earlie Server, MD      . sodium chloride flush (NS) 0.9 % injection 10 mL  10 mL Intravenous PRN Earlie Server, MD   10 mL at 04/04/19 0903  . sodium chloride flush (NS) 0.9 % injection 10 mL  10 mL Intravenous PRN Earlie Server, MD   10 mL at 07/26/20 1308     PHYSICAL EXAMINATION: ECOG  PERFORMANCE STATUS: 1 - Symptomatic but completely ambulatory Vitals:   11/09/20 0835  BP: (!) 151/93  Pulse: (!) 103  Resp: 16  Temp: 98.8 F (37.1 C)   Filed Weights   11/09/20 0835  Weight: 137 lb 9.6 oz (62.4 kg)    Physical Exam Constitutional:      General: He is not in acute distress. HENT:     Head: Normocephalic and atraumatic.  Eyes:     General: No scleral icterus. Cardiovascular:     Rate and Rhythm: Normal rate and regular rhythm.     Heart sounds: Normal heart sounds.  Pulmonary:     Effort: Pulmonary effort is normal. No respiratory distress.     Breath sounds: No wheezing.  Abdominal:     General: Bowel sounds are normal. There is no distension.     Palpations: Abdomen is soft.  Musculoskeletal:        General: No deformity. Normal range of motion.     Cervical back: Normal range of motion and neck supple.  Skin:    General: Skin is warm and dry.     Findings: No erythema or rash.  Neurological:     Mental Status: He is alert and oriented to person, place, and time. Mental status is at baseline.     Cranial Nerves: No cranial nerve deficit.     Coordination: Coordination normal.  Psychiatric:        Mood and Affect: Mood normal.      LABORATORY DATA:  I have reviewed the data as listed Lab  Results  Component Value Date   WBC 10.1 11/09/2020   HGB 13.0 11/09/2020   HCT 36.7 (L) 11/09/2020   MCV 119.2 (H) 11/09/2020   PLT 126 (L) 11/09/2020   Recent Labs    03/17/20 0830 03/31/20 0825 04/07/20 0854 04/22/20 0825 08/31/20 0812 09/07/20 1117 09/28/20 0846 10/19/20 0811 11/09/20 0810  NA 134* 137 137   < > 137   < > 142 141 135  K 4.6 3.9 3.7   < > 3.1*   < > 3.2* 3.1* 3.1*  CL 101 102 104   < > 100   < > 106 102 98  CO2 20* 23 21*   < > 25   < > _0 GLUCOSE 167* 126* 163*   < > 107*   < > 108* 78 163*  BUN _1 < > 7   < > _2 CREATININE 1.19 1.10 1.10   < > 0.79   < > 0.94 0.71 0.86  CALCIUM 9.0 9.1 8.8*   < > 8.9   < > 8.7* 9.0 8.9  GFRNONAA >60 >60 >60   < > >60   < > >60 >60 >60  GFRAA >60 >60 >60  --   --   --   --   --   --   PROT 7.7 7.6 7.1   < > 7.4   < > 7.2 7.9 7.3  ALBUMIN 3.5 3.5 3.4*   < > 3.4*   < > 3.3* 3.7 3.5  AST 32 29 25   < > 31   < > _3 ALT _4 < > 13   < > _5 ALKPHOS 86 87 76   < > 101   < > 87 106 83  BILITOT 0.8 0.7 0.5   < > 2.0*   < >  0.7 0.6 1.6*  BILIDIR  --   --   --   --  0.4*  --   --   --   --    < > = values in this interval not displayed.   Iron/TIBC/Ferritin/ %Sat No results found for: IRON, TIBC, FERRITIN, IRONPCTSAT   RADIOGRAPHIC STUDIES: I have personally reviewed the radiological images as listed and agreed with the findings in the report. CT CHEST ABDOMEN PELVIS W CONTRAST  Result Date: 10/16/2020 CLINICAL DATA:  Lung cancer, restaging, assess treatment response EXAM: CT CHEST, ABDOMEN, AND PELVIS WITH CONTRAST TECHNIQUE: Multidetector CT imaging of the chest, abdomen and pelvis was performed following the standard protocol during bolus administration of intravenous contrast. CONTRAST:  140m OMNIPAQUE IOHEXOL 300 MG/ML SOLN, additional oral enteric contrast COMPARISON:  07/06/2020 FINDINGS: CT CHEST FINDINGS Cardiovascular: Left chest port catheter. Aortic atherosclerosis. Normal  heart size. Left coronary artery calcifications. No pericardial effusion. Mediastinum/Nodes: Unchanged soft thickening about the right hilum. No discretely enlarged mediastinal, hilar, or axillary lymph nodes. Thyroid gland, trachea, and esophagus demonstrate no significant findings. Lungs/Pleura: Redemonstrated postoperative and post appearance of the right chest status post right upper lobectomy with perihilar consolidation and fibrosis. Slight interval decrease in size of pulmonary nodules associated with interlobular septal thickening in the right lung, an index nodule measuring 0.7 x 0.4 cm, previously 1.1 x 0.6 cm (series 4, image 55). There has been nearly complete resolution of previously seen left-sided pulmonary nodules, with minimal irregular residua, for example in the left lower lobe measuring approximately 2 mm, previously 5 mm (series 4, image 104). Trace right pleural effusion, improved compared to prior examination. Musculoskeletal: No chest wall mass or suspicious bone lesions identified. CT ABDOMEN PELVIS FINDINGS Hepatobiliary: No solid liver abnormality is seen. No gallstones, gallbladder wall thickening, or biliary dilatation. Pancreas: Unremarkable. No pancreatic ductal dilatation or surrounding inflammatory changes. Spleen: Normal in size without significant abnormality. Adrenals/Urinary Tract: Adrenal glands are unremarkable. Multiple punctuate bilateral nonobstructive renal calculi. No ureteral calculi or hydronephrosis. Bladder is unremarkable. Stomach/Bowel: Stomach is within normal limits. Appendix appears normal. No evidence of bowel wall thickening, distention, or inflammatory changes. Descending and sigmoid diverticulosis. Vascular/Lymphatic: Aortic atherosclerosis. No enlarged abdominal or pelvic lymph nodes. Reproductive: No mass or other abnormality. Other: No abdominal wall hernia or abnormality. No abdominopelvic ascites. Musculoskeletal: No acute or significant osseous findings.  IMPRESSION: 1. Redemonstrated postoperative and post appearance of the right chest status post right upper lobectomy with perihilar consolidation and fibrosis. 2. Slight interval decrease in size of pulmonary nodules associated with interlobular septal thickening in the right lung. 3. There has been nearly complete resolution of previously seen left-sided pulmonary nodules, with minimal irregular residua. 4. Trace right pleural effusion, improved compared to prior examination. 5. Findings are consistent with treatment response of pulmonary metastatic disease. 6. No evidence of metastatic disease within the abdomen or pelvis. 7. Nonobstructive bilateral nephrolithiasis. 8. Coronary artery disease. Aortic Atherosclerosis (ICD10-I70.0). Electronically Signed   By: AEddie CandleM.D.   On: 10/16/2020 21:01     ASSESSMENT & PLAN:  1. Squamous cell carcinoma of lung, right ([place]   2. Encounter for antineoplastic immunotherapy   3. Chemotherapy induced neutropenia (H[facility])   4. Encounter for antineoplastic chemotherapy   5. Hypomagnesemia   6. Hypokalemia    #Recurrent Squamous cell carcinoma of lung, stage IV On palliative chemotherapy with carboplatin Taxol and Keytruda, status post 4 cycles.  Had a partial response and now on palliative immunotherapy Keytruda only.  Tolerates well. Labs are reviewed and discussed with patient .  Proceed with Keytruda today.  #Chemotherapy-induced nausea, resolved.  Antiemetics as needed. #Chronic hypomagnesia.  Continue slow magnesium twice daily.   #Chronic hypokalemia, potassium 3.1 today.  He has been on potassium chloride 20 mEq twice daily.  Patient will receive IV potassium 20 mill equivalent x1 today. #Chemotherapy-induced thrombocytopenia, mild.  Stable. Return of visit:  Follow-up in 3 weeks.   Earlie Server, MD, PhD Hematology Oncology [facility] 11/09/2020

## 2020-11-09 NOTE — Progress Notes (Signed)
Nutrition Follow-up:  Patient with stage IV squamous cell lung cancer.  Patient receiving [place].   Met with patient during infusion.  Patient reports that he has started eating breakfast each morning which he has been skipping. Has been eating eggs, bacon, and toast.  Eating sandwich for lunch and dinner.  Drinking 2 ensure shakes.  Denies nausea and bowels are moving normally.       Medications: reviewed  Labs: glucose 163, K 3.1  Anthropometrics:   Weight 137 lb 9.6 oz today  136 lb 6.4 oz on 4/12 137 lb 4 oz on 3/21  145-151 lb on UBW   Estimated Energy Needs   NUTRITION DIAGNOSIS: Inadequate oral intake improving    INTERVENTION:  Continue eating breakfast consistently for added calories and protein Recommend increasing ensure plus TID for added calories and protein.   Denies needing any shakes today. Coupons given.     MONITORING, EVALUATION, GOAL: weight trends, intake   NEXT VISIT: Tuesday, May 24 during infusion  Josten Warmuth B. Zenia Resides, [place], [place] Registered Dietitian [phone] (mobile)

## 2020-11-09 NOTE — Progress Notes (Signed)
Patient denies new problems/concerns today.   °

## 2020-11-09 NOTE — Progress Notes (Signed)
Total Bilirubin: 1.6, Potassium: 3.1. MD, Dr. Tasia Catchings, notified and aware. Per MD order: proceed with scheduled Keytruda treatment today; MD placing orders for IV Potassium to be given in clinic today, see MAR.

## 2020-11-30 ENCOUNTER — Inpatient Hospital Stay: Payer: 59

## 2020-11-30 ENCOUNTER — Encounter: Payer: Self-pay | Admitting: Oncology

## 2020-11-30 ENCOUNTER — Inpatient Hospital Stay (HOSPITAL_BASED_OUTPATIENT_CLINIC_OR_DEPARTMENT_OTHER): Payer: 59 | Admitting: Oncology

## 2020-11-30 VITALS — BP 124/92 | HR 95 | Temp 97.8°F | Resp 18 | Wt 135.0 lb

## 2020-11-30 DIAGNOSIS — D701 Agranulocytosis secondary to cancer chemotherapy: Secondary | ICD-10-CM

## 2020-11-30 DIAGNOSIS — T451X5A Adverse effect of antineoplastic and immunosuppressive drugs, initial encounter: Secondary | ICD-10-CM

## 2020-11-30 DIAGNOSIS — C3491 Malignant neoplasm of unspecified part of right bronchus or lung: Secondary | ICD-10-CM

## 2020-11-30 DIAGNOSIS — R059 Cough, unspecified: Secondary | ICD-10-CM

## 2020-11-30 DIAGNOSIS — Z5112 Encounter for antineoplastic immunotherapy: Secondary | ICD-10-CM

## 2020-11-30 DIAGNOSIS — Z5111 Encounter for antineoplastic chemotherapy: Secondary | ICD-10-CM

## 2020-11-30 LAB — COMPREHENSIVE METABOLIC PANEL
ALT: 20 U/L (ref 0–44)
AST: 32 U/L (ref 15–41)
Albumin: 4.1 g/dL (ref 3.5–5.0)
Alkaline Phosphatase: 107 U/L (ref 38–126)
Anion gap: 14 (ref 5–15)
BUN: 12 mg/dL (ref 6–20)
CO2: 24 mmol/L (ref 22–32)
Calcium: 9.8 mg/dL (ref 8.9–10.3)
Chloride: 100 mmol/L (ref 98–111)
Creatinine, Ser: 1.07 mg/dL (ref 0.61–1.24)
GFR, Estimated: >60 mL/min (ref >60)
Glucose, Bld: 129 mg/dL — ABNORMAL HIGH (ref 70–99)
Potassium: 4.5 mmol/L (ref 3.5–5.1)
Sodium: 138 mmol/L (ref 135–145)
Total Bilirubin: 1.1 mg/dL (ref 0.3–1.2)
Total Protein: 8.3 g/dL — ABNORMAL HIGH (ref 6.5–8.1)

## 2020-11-30 LAB — CBC WITH DIFFERENTIAL/PLATELET
Abs Immature Granulocytes: 0.04 10*3/uL (ref 0.00–0.07)
Basophils Absolute: 0.1 10*3/uL (ref 0.0–0.1)
Basophils Relative: 1 %
Eosinophils Absolute: 0.2 10*3/uL (ref 0.0–0.5)
Eosinophils Relative: 2 %
HCT: 45.5 % (ref 39.0–52.0)
Hemoglobin: 16.5 g/dL (ref 13.0–17.0)
Immature Granulocytes: 0 %
Lymphocytes Relative: 8 %
Lymphs Abs: 1 10*3/uL (ref 0.7–4.0)
MCH: 40.7 pg — ABNORMAL HIGH (ref 26.0–34.0)
MCHC: 36.3 g/dL — ABNORMAL HIGH (ref 30.0–36.0)
MCV: 112.3 fL — ABNORMAL HIGH (ref 80.0–100.0)
Monocytes Absolute: 1.2 10*3/uL — ABNORMAL HIGH (ref 0.1–1.0)
Monocytes Relative: 9 %
Neutro Abs: 10.5 10*3/uL — ABNORMAL HIGH (ref 1.7–7.7)
Neutrophils Relative %: 80 %
Platelets: 145 10*3/uL — ABNORMAL LOW (ref 150–400)
RBC: 4.05 MIL/uL — ABNORMAL LOW (ref 4.22–5.81)
RDW: 11.5 % (ref 11.5–15.5)
WBC: 13.1 10*3/uL — ABNORMAL HIGH (ref 4.0–10.5)
nRBC: 0 % (ref 0.0–0.2)

## 2020-11-30 LAB — TSH: TSH: 1.023 u[IU]/mL (ref 0.350–4.500)

## 2020-11-30 MED ORDER — SODIUM CHLORIDE 0.9 % IV SOLN
Freq: Once | INTRAVENOUS | Status: AC
  Filled 2020-11-30: qty 250

## 2020-11-30 MED ORDER — SODIUM CHLORIDE 0.9 % IV SOLN
200.0000 mg | Freq: Once | INTRAVENOUS | Status: AC
  Administered 2020-11-30: 200 mg via INTRAVENOUS
  Filled 2020-11-30: qty 8

## 2020-11-30 MED ORDER — HEPARIN SOD (PORK) LOCK FLUSH 100 UNIT/ML IV SOLN
INTRAVENOUS | Status: AC
  Filled 2020-11-30: qty 5

## 2020-11-30 MED ORDER — HEPARIN SOD (PORK) LOCK FLUSH 100 UNIT/ML IV SOLN
500.0000 [IU] | Freq: Once | INTRAVENOUS | Status: AC | PRN
  Administered 2020-11-30: 500 [IU]
  Filled 2020-11-30: qty 5

## 2020-11-30 NOTE — Patient Instructions (Signed)
[facility]  Discharge Instructions: Thank you for choosing [facility] to provide your oncology and hematology care.  If you have a lab appointment with the [facility], please go directly to the [facility] and check in at the registration area.  Wear comfortable clothing and clothing appropriate for easy access to any Portacath or PICC line.   We strive to give you quality time with your provider. You may need to reschedule your appointment if you arrive late (15 or more minutes).  Arriving late affects you and other patients whose appointments are after yours.  Also, if you miss three or more appointments without notifying the office, you may be dismissed from the clinic at the provider's discretion.      For prescription refill requests, have your pharmacy contact our office and allow 72 hours for refills to be completed.    Today you received the following chemotherapy and/or immunotherapy agents: [name]      To help prevent nausea and vomiting after your treatment, we encourage you to take your nausea medication as directed.  BELOW ARE SYMPTOMS THAT SHOULD BE REPORTED IMMEDIATELY: . *FEVER GREATER THAN 100.4 F (38 C) OR HIGHER . *CHILLS OR SWEATING . *NAUSEA AND VOMITING THAT IS NOT CONTROLLED WITH YOUR NAUSEA MEDICATION . *UNUSUAL SHORTNESS OF BREATH . *UNUSUAL BRUISING OR BLEEDING . *URINARY PROBLEMS (pain or burning when urinating, or frequent urination) . *BOWEL PROBLEMS (unusual diarrhea, constipation, pain near the anus) . TENDERNESS IN MOUTH AND THROAT WITH OR WITHOUT PRESENCE OF ULCERS (sore throat, sores in mouth, or a toothache) . UNUSUAL RASH, SWELLING OR PAIN  . UNUSUAL VAGINAL DISCHARGE OR ITCHING   Items with * indicate a potential emergency and should be followed up as soon as possible or go to the Emergency Department if any problems should occur.  Please show the CHEMOTHERAPY ALERT CARD or IMMUNOTHERAPY  ALERT CARD at check-in to the Emergency Department and triage nurse.  Should you have questions after your visit or need to cancel or reschedule your appointment, please contact [facility]  [phone] and follow the prompts.  Office hours are 8:00 a.m. to 4:30 p.m. Monday - Friday. Please note that voicemails left after 4:00 p.m. may not be returned until the following business day.  We are closed weekends and major holidays. You have access to a nurse at all times for urgent questions. Please call the main number to the clinic [phone] and follow the prompts.  For any non-urgent questions, you may also contact your provider using MyChart. We now offer e-Visits for anyone 32 and older to request care online for non-urgent symptoms. For details visit [email or web address].   Also download the MyChart app! Go to the app store, search "MyChart", open the app, select [facility], and log in with your MyChart username and password.  Due to Covid, a mask is required upon entering the hospital/clinic. If you do not have a mask, one will be given to you upon arrival. For doctor visits, patients may have 1 support person aged 68 or older with them. For treatment visits, patients cannot have anyone with them due to current Covid guidelines and our immunocompromised population.

## 2020-11-30 NOTE — Progress Notes (Signed)
Nutrition Follow-up:  Patient with stage IV squamous cell lung cancer.  Patient receiving [place].   Met with patient during infusion.  Patient reports that appetite is the same.  Does not know why he lost weight.  Says that he has been eating breakfast (bacon, eggs).  Skips lunch most days as goes back to sleep and does not get up until noon.  Wakes up at 5:30 and drinks shake and or cooks breakfast then goes back to bed.  Eats dinner around 6:30-7 after wife gets home from work.  Goes to bed around midnight or 1-2 am.  Has been drinking 2 ensure shakes.  Last night ate roast, potato salad and macaroni salad and bread for dinner.      Medications: reviewed  Labs: glucose 129  Anthropometrics:   Weight 135 lb today  136 lb 6.4 oz on 4/12 137 lb 4 oz on 3/21  145-151 lb UBW   NUTRITION DIAGNOSIS: Inadequate oral intake continues with weight loss   INTERVENTION:  Continue eating breakfast and supper but add in light snack and ensure after wakes up early afternoon.  Patient says he can eat sandwich (ham and cheese, [place] and cheese, roast beef and cheese), chips Increase ensure plus to TID (breakfast, lunch and before bed)    MONITORING, EVALUATION, GOAL: weight trends, intake   NEXT VISIT: Tuesday, June 14th during infusion  Randie Tallarico B. Zenia Resides, [place], [place] Registered Dietitian [phone] (mobile)

## 2020-11-30 NOTE — Progress Notes (Signed)
Hematology/Oncology Follow up note [facility] [phone] [phone]   Patient Care Team: Samuel Harrier, MD as PCP - General (Internal Medicine) Samuel Server, MD as Consulting Physician (Hematology and Oncology)  REASON FOR VISIT:  Follow up for treatment of squamous lung cancer.   HISTORY OF PRESENTING ILLNESS:  # Dec 2019 Stage I squamous lung cancer #s/p Bronchoscopy on 06/25/2018. subcarina EBUS FNA was non diagnostic, hypocellular specimen.  # 08/13/2018 CT guided right upper lobe biopsy pathology showed dense fibrosis and mixed inflammatory cells with prominent polytypic plasma cells component. Focal benign bronchial wall and alveolar spaces. No malignancy was identified.   # 08/13/2018 underwent right thoracotomy and wedge resection of a right upper lobe mass.  Frozen section was consistent with an inflammatory process.  On the second postop day, preliminary pathology reports high-grade malignancy.  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  08/20/2018 Patient was therefore taken back to the OR and underwent take complete lobectomy  pT1b pN0 cM0 stage I squamous cell lung cancer.  Margin is negative.  Recommend observation.    # 12/03/2018 CT chest w contrast showed local recurrence.  12/10/2018 PET showed No evidence of distant metastatic disease # 12/26/2018 s/p bronchoscopy biopsy. Confirmed local recurrence of squamous cell lung cancer.  medi port placed by Samuel Choi.  # 06/14/2020 CT chest w contrast was reviewed and discussed with patient.  Evidence of progressive lymphangitic spread of tumor throughout the right lung, with contralateral lung nodules with  right pleural effusion  MRI brain is negative for CNS involvement.  CT abdomen with contrast showed no evidence of abdominal metastatic disease PET scan was not approved by insurance-peer to peer appeal with Dr.Vipul Choi   # NGS - KRAS V14L,  TMB 26.6-high, MS stable, PD-L1  5% CDKN2C G48, DICER1c.2117-1G>T, FLT4 G1131S, NF1 A418f, NF1 L2643*, STK11 N1857fCancer treatment Started concurrent chemoradiation on 01/16/2019 Carboplatin AUC of 2 and Taxol 45 mg/m2 weekly finished in 03/05/2019 04/10/2019, patient started on durvalumab maintenance. -CT showed progression. 07/16/2020-09/28/2020 4 cycles of carboplatin/paclitaxel/Keytruda  10/15/2020, CT chest abdomen pelvis redemonstrated postoperative and postradiation appearance of the right chest with perihilar consolidation and fibrosis.  Slight interval decrease in size of the pulmonary nodules associated with interlobular septal thickening nearly complete resolution of previously seen left-sided pulmonary nodules.  Minimal irregular residual.  Right pleural effusion improved.  Consistent with treatment response.  No evidence of metastatic disease with the abdomen or pelvis.  None obstructive bilateral nephrolithiasis - partial response to the treatment- 10/19/2020, Keytruda monotherapy maintenance  INTERVAL HISTORY Samuel Choi a [age].o. male who has above history reviewed by me today presents for reatment of  recurrent  squamous cell lung cancer. Patient has been on Keytruda maintenance.  Overall tolerates well.  No new complaints.  Chronic cough, not worse. Review of Systems  Constitutional: Positive for fatigue. Negative for appetite change, chills, fever and unexpected weight change.  HENT:   Negative for hearing loss and voice change.   Eyes: Negative for eye problems and icterus.  Respiratory: Positive for cough. Negative for chest tightness and shortness of breath.   Cardiovascular: Negative for chest pain and leg swelling.  Gastrointestinal: Negative for abdominal distention, abdominal pain and nausea.  Endocrine: Negative for hot flashes.  Genitourinary: Negative for difficulty urinating, dysuria and frequency.   Musculoskeletal: Negative for arthralgias.  Skin: Negative for itching and rash.   Neurological: Negative for light-headedness and numbness.  Hematological: Negative for adenopathy. Does not bruise/bleed  easily.  Psychiatric/Behavioral: Negative for confusion.    MEDICAL HISTORY:  Past Medical History:  Diagnosis Date  . Alcohol abuse    usually drinks 2-3 drinks per day  . Atherosclerosis 06/2018  . Chronic sinusitis   . Dehydration 02/07/2019  . Emphysema of lung ([place]) 06/2018   patient unaware of this.  . Hip fracture ([place]) 05/2018   no surgery  . History of kidney stones 05/2018   per xray, bilateral nephrolitiasis  . Hypertension   . Squamous cell carcinoma of lung, right ([place]) 06/2018    SURGICAL HISTORY: Past Surgical History:  Procedure Laterality Date  . BRAIN SURGERY  10/2017   nasal/sinus endoscopy. mass benign  . ELECTROMAGNETIC NAVIGATION BROCHOSCOPY Right 06/25/2018   Procedure: ELECTROMAGNETIC NAVIGATION BRONCHOSCOPY;  Surgeon: Samuel Lipps, MD;  Location: [facility] ORS;  Service: Cardiopulmonary;  Laterality: Right;  . ENDOBRONCHIAL ULTRASOUND Right 06/25/2018   Procedure: ENDOBRONCHIAL ULTRASOUND;  Surgeon: Samuel Lipps, MD;  Location: [facility] OR[facility];  Service: Cardiopulmonary;  Laterality: Right;  . ENDOBRONCHIAL ULTRASOUND Right 12/26/2018   Procedure: ENDOBRONCHIAL ULTRASOUND RIGHT;  Surgeon: Samuel Lipps, MD;  Location: [facility] OR[facility];  Service: Cardiopulmonary;  Laterality: Right;  . FLEXIBLE BRONCHOSCOPY N/A 08/20/2018   Procedure: FLEXIBLE BRONCHOSCOPY PREOP;  Surgeon: Samuel Lewandowsky, MD;  Location: [facility] OR[facility];  Service: Thoracic;  Laterality: N/A;  . IR CV LINE INJECTION  04/04/2019  . NASAL SINUS SURGERY  10/2017   At [facility], frontal sinusotomy, ethmoidectomy, resection anterior cranial fossa neoplasm, turbinate resection  . PORTACATH PLACEMENT Left 01/15/2019   Procedure: INSERTION PORT-A-CATH;  Surgeon: Samuel Lewandowsky, MD;  Location: [facility] OR[facility];  Service: General;  Laterality: Left;  . THORACOTOMY Right 08/13/2018   Procedure: PREOP BROCHOSCOPY WITH RIGHT  THORACOTOMY AND RUL RESECTION;  Surgeon: Samuel Lewandowsky, MD;  Location: [facility];  Service: General;  Laterality: Right;  . THORACOTOMY Right 08/20/2018   Procedure: THORACOTOMY MAJOR RIGHT UPPER LOBE LOBECTOMY;  Surgeon: Samuel Lewandowsky, MD;  Location: [facility] OR[facility];  Service: Thoracic;  Laterality: Right;  . TOE SURGERY Left    pin in left toe    SOCIAL HISTORY: Social History   Socioeconomic History  . Marital status: Married    Spouse name: lisa  . Number of children: Not on file  . Years of education: Not on file  . Highest education level: Not on file  Occupational History  . Occupation: welding    Comment: taking time off to resolve issues  Tobacco Use  . Smoking status: Former Smoker    Packs/day: 0.50    Years: 15.00    Pack years: 7.50    Types: Cigarettes    Quit date: 08/2018    Years since quitting: 2.3  . Smokeless tobacco: Never Used  Vaping Use  . Vaping Use: Never used  Substance and Sexual Activity  . Alcohol use: Yes    Alcohol/week: 3.0 standard drinks    Types: 3 Cans of beer per week    Comment: usually 2 drinks per week, per patient  . Drug use: No  . Sexual activity: Not on file  Other Topics Concern  . Not on file  Social History Narrative  . Not on file   Social Determinants of Health   Financial Resource Strain: Not on file  Food Insecurity: Not on file  Transportation Needs: Not on file  Physical Activity: Not on file  Stress: Not on file  Social Connections: Not on file  Intimate Partner Violence: Not on file    FAMILY HISTORY: Family History  Problem Relation Age of Onset  . Breast cancer Mother   . Diabetes Mother   . Lung cancer Father   . Hypertension Father     ALLERGIES:  has No Known Allergies.  MEDICATIONS:  Current Outpatient Medications  Medication Sig Dispense Refill  . albuterol (VENTOLIN HFA) 108 (90 Base) MCG/ACT inhaler TAKE 2 PUFFS BY MOUTH EVERY 6 HOURS AS NEEDED FOR WHEEZE OR SHORTNESS OF BREATH 6.7 each 6  .  cloNIDine (CATAPRES) 0.1 MG tablet Take 0.1 mg by mouth daily.   5  . diltiazem (CARDIZEM CD) 120 MG 24 hr capsule Take 120 mg by mouth daily.    . folic acid (V-R FOLIC ACID) 161 MCG tablet Take 1 tablet (400 mcg total) by mouth daily. 90 tablet 1  . hydrALAZINE (APRESOLINE) 100 MG tablet Take 50 mg by mouth 2 (two) times daily.     . hydrochlorothiazide (HYDRODIURIL) 12.5 MG tablet Take 12.5 mg by mouth daily.     Marland Kitchen ibuprofen (ADVIL) 600 MG tablet Take 600 mg by mouth every 6 (six) hours.    Marland Kitchen KLOR-CON M20 20 MEQ tablet Take 2 tablets (40 mEq total) by mouth daily. 180 tablet 1  . latanoprost (XALATAN) 0.005 % ophthalmic solution 1 drop at bedtime.    . lidocaine-prilocaine (EMLA) cream Apply to affected area once 30 g 3  . loratadine (CLARITIN) 10 MG tablet Take 10 mg by mouth daily.    . magnesium chloride (SLOW-MAG) 64 MG TBEC SR tablet Take 1 tablet (64 mg total) by mouth 2 (two) times daily. TAKE 1 TABLET (64 MG TOTAL) BY MOUTH DAILY. 60 tablet 1  . methocarbamol (ROBAXIN) 500 MG tablet Take 1 tablet (500 mg total) by mouth at bedtime. 30 tablet 0  . nystatin (MYCOSTATIN) 100000 UNIT/ML suspension Take 5 mLs (500,000 Units total) by mouth 4 (four) times daily. Swish and spit. 473 mL 0  . olmesartan (BENICAR) 40 MG tablet Take 40 mg by mouth every other day.     . ondansetron (ZOFRAN) 8 MG tablet Take 1 tablet (8 mg total) by mouth every 8 (eight) hours as needed for refractory nausea / vomiting. Start on day 3 after chemo. 90 tablet 3  . prochlorperazine (COMPAZINE) 10 MG tablet Take 1 tablet (10 mg total) by mouth every 6 (six) hours as needed (Nausea or vomiting). 90 tablet 3  . SPIRIVA HANDIHALER 18 MCG inhalation capsule INHALE 1 CAPSULE VIA HANDIHALER ONCE DAILY AT THE SAME TIME EVERY DAY 30 capsule 0  . SYMBICORT 80-4.5 MCG/ACT inhaler INHALE 2 PUFFS INTO THE LUNGS IN THE MORNING AND AT BEDTIME. 10.2 each 6  . vitamin B-12 (CYANOCOBALAMIN) 1000 MCG tablet Take 1 tablet (1,000 mcg  total) by mouth daily. 90 tablet 1   No current facility-administered medications for this visit.   Facility-Administered Medications Ordered in Other Visits  Medication Dose Route Frequency Provider Last Rate Last Admin  . albuterol (PROVENTIL) (2.5 MG/3ML) 0.083% nebulizer solution 2.5 mg  2.5 mg Nebulization Once System, Provider Not In      . heparin lock flush 100 unit/mL  500 Units Intravenous Once Samuel Server, MD      . sodium chloride flush (NS) 0.9 % injection 10 mL  10 mL Intravenous PRN Samuel Server, MD   10 mL at 04/04/19 0903  . sodium chloride flush (NS) 0.9 % injection 10 mL  10 mL Intravenous PRN Samuel Server, MD   10 mL at 07/26/20 1308     PHYSICAL EXAMINATION:  ECOG PERFORMANCE STATUS: 1 - Symptomatic but completely ambulatory Vitals:   11/30/20 0940  BP: (!) 124/92  Pulse: 95  Resp: 18  Temp: 97.8 F (36.6 C)  SpO2: 100%   Filed Weights   11/30/20 0940  Weight: 135 lb (61.2 kg)    Physical Exam Constitutional:      General: He is not in acute distress. HENT:     Head: Normocephalic and atraumatic.  Eyes:     General: No scleral icterus. Cardiovascular:     Rate and Rhythm: Normal rate and regular rhythm.     Heart sounds: Normal heart sounds.  Pulmonary:     Effort: Pulmonary effort is normal. No respiratory distress.     Breath sounds: No wheezing.  Abdominal:     General: Bowel sounds are normal. There is no distension.     Palpations: Abdomen is soft.  Musculoskeletal:        General: No deformity. Normal range of motion.     Cervical back: Normal range of motion and neck supple.  Skin:    General: Skin is warm and dry.     Findings: No erythema or rash.  Neurological:     Mental Status: He is alert and oriented to person, place, and time. Mental status is at baseline.     Cranial Nerves: No cranial nerve deficit.     Coordination: Coordination normal.  Psychiatric:        Mood and Affect: Mood normal.      LABORATORY DATA:  I have reviewed the  data as listed Lab Results  Component Value Date   WBC 13.1 (H) 11/30/2020   HGB 16.5 11/30/2020   HCT 45.5 11/30/2020   MCV 112.3 (H) 11/30/2020   PLT 145 (L) 11/30/2020   Recent Labs    03/17/20 0830 03/31/20 0825 04/07/20 0854 04/22/20 0825 08/31/20 0812 09/07/20 1117 10/19/20 0811 11/09/20 0810 11/30/20 0806  NA 134* 137 137   < > 137   < > 141 135 138  K 4.6 3.9 3.7   < > 3.1*   < > 3.1* 3.1* 4.5  CL 101 102 104   < > 100   < > 102 98 100  CO2 20* 23 21*   < > 25   < > _0 GLUCOSE 167* 126* 163*   < > 107*   < > 78 163* 129*  BUN _1 < > 7   < > _2 CREATININE 1.19 1.10 1.10   < > 0.79   < > 0.71 0.86 1.07  CALCIUM 9.0 9.1 8.8*   < > 8.9   < > 9.0 8.9 9.8  GFRNONAA >60 >60 >60   < > >60   < > >60 >60 >60  GFRAA >60 >60 >60  --   --   --   --   --   --   PROT 7.7 7.6 7.1   < > 7.4   < > 7.9 7.3 8.3*  ALBUMIN 3.5 3.5 3.4*   < > 3.4*   < > 3.7 3.5 4.1  AST 32 29 25   < > 31   < > 24 25 32  ALT _3 < > 13   < > _4 ALKPHOS 86 87 76   < > 101   < > 106 83 107  BILITOT 0.8 0.7 0.5   < > 2.0*   < >  0.6 1.6* 1.1  BILIDIR  --   --   --   --  0.4*  --   --   --   --    < > = values in this interval not displayed.   Iron/TIBC/Ferritin/ %Sat No results found for: IRON, TIBC, FERRITIN, IRONPCTSAT   RADIOGRAPHIC STUDIES: I have personally reviewed the radiological images as listed and agreed with the findings in the report. CT CHEST ABDOMEN PELVIS W CONTRAST  Result Date: 10/16/2020 CLINICAL DATA:  Lung cancer, restaging, assess treatment response EXAM: CT CHEST, ABDOMEN, AND PELVIS WITH CONTRAST TECHNIQUE: Multidetector CT imaging of the chest, abdomen and pelvis was performed following the standard protocol during bolus administration of intravenous contrast. CONTRAST:  118m OMNIPAQUE IOHEXOL 300 MG/ML SOLN, additional oral enteric contrast COMPARISON:  07/06/2020 FINDINGS: CT CHEST FINDINGS Cardiovascular: Left chest port catheter. Aortic  atherosclerosis. Normal heart size. Left coronary artery calcifications. No pericardial effusion. Mediastinum/Nodes: Unchanged soft thickening about the right hilum. No discretely enlarged mediastinal, hilar, or axillary lymph nodes. Thyroid gland, trachea, and esophagus demonstrate no significant findings. Lungs/Pleura: Redemonstrated postoperative and post appearance of the right chest status post right upper lobectomy with perihilar consolidation and fibrosis. Slight interval decrease in size of pulmonary nodules associated with interlobular septal thickening in the right lung, an index nodule measuring 0.7 x 0.4 cm, previously 1.1 x 0.6 cm (series 4, image 55). There has been nearly complete resolution of previously seen left-sided pulmonary nodules, with minimal irregular residua, for example in the left lower lobe measuring approximately 2 mm, previously 5 mm (series 4, image 104). Trace right pleural effusion, improved compared to prior examination. Musculoskeletal: No chest wall mass or suspicious bone lesions identified. CT ABDOMEN PELVIS FINDINGS Hepatobiliary: No solid liver abnormality is seen. No gallstones, gallbladder wall thickening, or biliary dilatation. Pancreas: Unremarkable. No pancreatic ductal dilatation or surrounding inflammatory changes. Spleen: Normal in size without significant abnormality. Adrenals/Urinary Tract: Adrenal glands are unremarkable. Multiple punctuate bilateral nonobstructive renal calculi. No ureteral calculi or hydronephrosis. Bladder is unremarkable. Stomach/Bowel: Stomach is within normal limits. Appendix appears normal. No evidence of bowel wall thickening, distention, or inflammatory changes. Descending and sigmoid diverticulosis. Vascular/Lymphatic: Aortic atherosclerosis. No enlarged abdominal or pelvic lymph nodes. Reproductive: No mass or other abnormality. Other: No abdominal wall hernia or abnormality. No abdominopelvic ascites. Musculoskeletal: No acute or  significant osseous findings. IMPRESSION: 1. Redemonstrated postoperative and post appearance of the right chest status post right upper lobectomy with perihilar consolidation and fibrosis. 2. Slight interval decrease in size of pulmonary nodules associated with interlobular septal thickening in the right lung. 3. There has been nearly complete resolution of previously seen left-sided pulmonary nodules, with minimal irregular residua. 4. Trace right pleural effusion, improved compared to prior examination. 5. Findings are consistent with treatment response of pulmonary metastatic disease. 6. No evidence of metastatic disease within the abdomen or pelvis. 7. Nonobstructive bilateral nephrolithiasis. 8. Coronary artery disease. Aortic Atherosclerosis (ICD10-I70.0). Electronically Signed   By: AEddie CandleM.D.   On: 10/16/2020 21:01     ASSESSMENT & PLAN:  1. [place]   2. Hypomagnesemia   3. Encounter for antineoplastic immunotherapy   4. Encounter for antineoplastic chemotherapy   5. Chemotherapy induced neutropenia ([facility])    #Recurrent Squamous cell carcinoma of lung, stage IV On palliative chemotherapy with carboplatin Taxol and Keytruda, status post 4 cycles.  Had a partial response and now on palliative immunotherapy Keytruda only.  Labs are reviewed and  discussed with patient Proceed with Keytruda today. I will obtain a CT chest without contrast prior to the next visit for evaluation of disease status when he is on a Keytruda only.Marland Kitchen  #Chronic hypomagnesia.  Continue slow magnesium twice daily.   #Chronic hypokalemia, potassium 4.5 today.  Continue potassium chloride 20 mEq twice daily.   #Chemotherapy-induced thrombocytopenia, mild.  Stable. Return of visit:  Follow-up in 3 weeks.   Samuel Server, MD, PhD Hematology Oncology [facility] 11/30/2020

## 2020-11-30 NOTE — Progress Notes (Signed)
Pt here for follow up. No new concerns voiced.   

## 2020-12-15 ENCOUNTER — Ambulatory Visit
Admission: RE | Admit: 2020-12-15 | Discharge: 2020-12-15 | Disposition: A | Discharge status: Home / Self Care | Payer: 59 | Source: Ambulatory Visit | Attending: Oncology | Admitting: Oncology

## 2020-12-15 ENCOUNTER — Other Ambulatory Visit: Payer: Self-pay

## 2020-12-15 ENCOUNTER — Encounter: Payer: Self-pay | Admitting: Oncology

## 2020-12-15 DIAGNOSIS — C3491 Malignant neoplasm of unspecified part of right bronchus or lung: Secondary | ICD-10-CM | POA: Insufficient documentation

## 2020-12-21 ENCOUNTER — Inpatient Hospital Stay: Payer: 59 | Attending: Oncology

## 2020-12-21 ENCOUNTER — Encounter: Payer: Self-pay | Admitting: Oncology

## 2020-12-21 ENCOUNTER — Inpatient Hospital Stay (HOSPITAL_BASED_OUTPATIENT_CLINIC_OR_DEPARTMENT_OTHER): Payer: 59 | Admitting: Oncology

## 2020-12-21 ENCOUNTER — Inpatient Hospital Stay: Payer: 59

## 2020-12-21 VITALS — BP 118/75 | HR 106 | Temp 97.9°F | Resp 16 | Wt 138.0 lb

## 2020-12-21 DIAGNOSIS — Z79899 Other long term (current) drug therapy: Secondary | ICD-10-CM | POA: Insufficient documentation

## 2020-12-21 DIAGNOSIS — Z95828 Presence of other vascular implants and grafts: Secondary | ICD-10-CM | POA: Diagnosis not present

## 2020-12-21 DIAGNOSIS — Z5112 Encounter for antineoplastic immunotherapy: Secondary | ICD-10-CM

## 2020-12-21 DIAGNOSIS — C3411 Malignant neoplasm of upper lobe, right bronchus or lung: Secondary | ICD-10-CM | POA: Insufficient documentation

## 2020-12-21 DIAGNOSIS — D6959 Other secondary thrombocytopenia: Secondary | ICD-10-CM | POA: Diagnosis not present

## 2020-12-21 DIAGNOSIS — Z7951 Long term (current) use of inhaled steroids: Secondary | ICD-10-CM | POA: Diagnosis not present

## 2020-12-21 DIAGNOSIS — Z801 Family history of malignant neoplasm of trachea, bronchus and lung: Secondary | ICD-10-CM | POA: Insufficient documentation

## 2020-12-21 DIAGNOSIS — C3491 Malignant neoplasm of unspecified part of right bronchus or lung: Secondary | ICD-10-CM | POA: Diagnosis not present

## 2020-12-21 DIAGNOSIS — Z87891 Personal history of nicotine dependence: Secondary | ICD-10-CM | POA: Diagnosis not present

## 2020-12-21 DIAGNOSIS — Z8249 Family history of ischemic heart disease and other diseases of the circulatory system: Secondary | ICD-10-CM | POA: Insufficient documentation

## 2020-12-21 DIAGNOSIS — Z833 Family history of diabetes mellitus: Secondary | ICD-10-CM | POA: Insufficient documentation

## 2020-12-21 DIAGNOSIS — E876 Hypokalemia: Secondary | ICD-10-CM | POA: Diagnosis not present

## 2020-12-21 DIAGNOSIS — Z791 Long term (current) use of non-steroidal anti-inflammatories (NSAID): Secondary | ICD-10-CM | POA: Diagnosis not present

## 2020-12-21 DIAGNOSIS — I10 Essential (primary) hypertension: Secondary | ICD-10-CM | POA: Diagnosis not present

## 2020-12-21 DIAGNOSIS — R059 Cough, unspecified: Secondary | ICD-10-CM

## 2020-12-21 DIAGNOSIS — Z803 Family history of malignant neoplasm of breast: Secondary | ICD-10-CM | POA: Diagnosis not present

## 2020-12-21 LAB — CBC WITH DIFFERENTIAL/PLATELET
Abs Immature Granulocytes: 0.03 10*3/uL (ref 0.00–0.07)
Basophils Absolute: 0.1 10*3/uL (ref 0.0–0.1)
Basophils Relative: 1 %
Eosinophils Absolute: 0.3 10*3/uL (ref 0.0–0.5)
Eosinophils Relative: 3 %
HCT: 43.6 % (ref 39.0–52.0)
Hemoglobin: 15.6 g/dL (ref 13.0–17.0)
Immature Granulocytes: 0 %
Lymphocytes Relative: 11 %
Lymphs Abs: 1.4 10*3/uL (ref 0.7–4.0)
MCH: 38.7 pg — ABNORMAL HIGH (ref 26.0–34.0)
MCHC: 35.8 g/dL (ref 30.0–36.0)
MCV: 108.2 fL — ABNORMAL HIGH (ref 80.0–100.0)
Monocytes Absolute: 1.2 10*3/uL — ABNORMAL HIGH (ref 0.1–1.0)
Monocytes Relative: 10 %
Neutro Abs: 9.5 10*3/uL — ABNORMAL HIGH (ref 1.7–7.7)
Neutrophils Relative %: 75 %
Platelets: 116 10*3/uL — ABNORMAL LOW (ref 150–400)
RBC: 4.03 MIL/uL — ABNORMAL LOW (ref 4.22–5.81)
RDW: 11.7 % (ref 11.5–15.5)
WBC: 12.5 10*3/uL — ABNORMAL HIGH (ref 4.0–10.5)
nRBC: 0 % (ref 0.0–0.2)

## 2020-12-21 LAB — COMPREHENSIVE METABOLIC PANEL
ALT: 19 U/L (ref 0–44)
AST: 24 U/L (ref 15–41)
Albumin: 3.7 g/dL (ref 3.5–5.0)
Alkaline Phosphatase: 94 U/L (ref 38–126)
Anion gap: 11 (ref 5–15)
BUN: 10 mg/dL (ref 6–20)
CO2: 25 mmol/L (ref 22–32)
Calcium: 9.4 mg/dL (ref 8.9–10.3)
Chloride: 102 mmol/L (ref 98–111)
Creatinine, Ser: 1.01 mg/dL (ref 0.61–1.24)
GFR, Estimated: >60 mL/min (ref >60)
Glucose, Bld: 81 mg/dL (ref 70–99)
Potassium: 4.5 mmol/L (ref 3.5–5.1)
Sodium: 138 mmol/L (ref 135–145)
Total Bilirubin: 0.4 mg/dL (ref 0.3–1.2)
Total Protein: 7.9 g/dL (ref 6.5–8.1)

## 2020-12-21 LAB — TSH: TSH: 0.86 u[IU]/mL (ref 0.350–4.500)

## 2020-12-21 MED ORDER — HEPARIN SOD (PORK) LOCK FLUSH 100 UNIT/ML IV SOLN
500.0000 [IU] | Freq: Once | INTRAVENOUS | Status: AC | PRN
  Administered 2020-12-21: 500 [IU]
  Filled 2020-12-21: qty 5

## 2020-12-21 MED ORDER — SODIUM CHLORIDE 0.9% FLUSH
10.0000 mL | INTRAVENOUS | Status: DC | PRN
  Filled 2020-12-21: qty 10

## 2020-12-21 MED ORDER — SODIUM CHLORIDE 0.9 % IV SOLN
Freq: Once | INTRAVENOUS | Status: AC
Start: 2020-12-21 — End: 2020-12-21
  Filled 2020-12-21: qty 250

## 2020-12-21 MED ORDER — SODIUM CHLORIDE 0.9 % IV SOLN
200.0000 mg | Freq: Once | INTRAVENOUS | Status: AC
  Administered 2020-12-21: 200 mg via INTRAVENOUS
  Filled 2020-12-21: qty 8

## 2020-12-21 MED ORDER — HEPARIN SOD (PORK) LOCK FLUSH 100 UNIT/ML IV SOLN
INTRAVENOUS | Status: AC
  Filled 2020-12-21: qty 5

## 2020-12-21 MED ORDER — HEPARIN SOD (PORK) LOCK FLUSH 100 UNIT/ML IV SOLN
500.0000 [IU] | Freq: Once | INTRAVENOUS | Status: DC
  Filled 2020-12-21: qty 5

## 2020-12-21 NOTE — Patient Instructions (Signed)
[facility]  Discharge Instructions: Thank you for choosing [facility] to provide your oncology and hematology care.  If you have a lab appointment with the [facility], please go directly to the [facility] and check in at the registration area.  Wear comfortable clothing and clothing appropriate for easy access to any Portacath or PICC line.   We strive to give you quality time with your provider. You may need to reschedule your appointment if you arrive late (15 or more minutes).  Arriving late affects you and other patients whose appointments are after yours.  Also, if you miss three or more appointments without notifying the office, you may be dismissed from the clinic at the provider's discretion.      For prescription refill requests, have your pharmacy contact our office and allow 72 hours for refills to be completed.    Today you received the following chemotherapy and/or immunotherapy agents - pembrolizumab      To help prevent nausea and vomiting after your treatment, we encourage you to take your nausea medication as directed.  BELOW ARE SYMPTOMS THAT SHOULD BE REPORTED IMMEDIATELY: *FEVER GREATER THAN 100.4 F (38 C) OR HIGHER *CHILLS OR SWEATING *NAUSEA AND VOMITING THAT IS NOT CONTROLLED WITH YOUR NAUSEA MEDICATION *UNUSUAL SHORTNESS OF BREATH *UNUSUAL BRUISING OR BLEEDING *URINARY PROBLEMS (pain or burning when urinating, or frequent urination) *BOWEL PROBLEMS (unusual diarrhea, constipation, pain near the anus) TENDERNESS IN MOUTH AND THROAT WITH OR WITHOUT PRESENCE OF ULCERS (sore throat, sores in mouth, or a toothache) UNUSUAL RASH, SWELLING OR PAIN  UNUSUAL VAGINAL DISCHARGE OR ITCHING   Items with * indicate a potential emergency and should be followed up as soon as possible or go to the Emergency Department if any problems should occur.  Please show the CHEMOTHERAPY ALERT CARD or IMMUNOTHERAPY ALERT CARD at  check-in to the Emergency Department and triage nurse.  Should you have questions after your visit or need to cancel or reschedule your appointment, please contact [facility]  [phone] and follow the prompts.  Office hours are 8:00 a.m. to 4:30 p.m. Monday - Friday. Please note that voicemails left after 4:00 p.m. may not be returned until the following business day.  We are closed weekends and major holidays. You have access to a nurse at all times for urgent questions. Please call the main number to the clinic [phone] and follow the prompts.  For any non-urgent questions, you may also contact your provider using MyChart. We now offer e-Visits for anyone 56 and older to request care online for non-urgent symptoms. For details visit [email or web address].   Also download the MyChart app! Go to the app store, search "MyChart", open the app, select [facility], and log in with your MyChart username and password.  Due to Covid, a mask is required upon entering the hospital/clinic. If you do not have a mask, one will be given to you upon arrival. For doctor visits, patients may have 1 support person aged 55 or older with them. For treatment visits, patients cannot have anyone with them due to current Covid guidelines and our immunocompromised population.   Pembrolizumab injection What is this medication? PEMBROLIZUMAB (pem broe liz ue mab) is a monoclonal antibody. It is used totreat certain types of cancer. This medicine may be used for other purposes; ask your health care provider orpharmacist if you have questions. COMMON BRAND NAME(S): Keytruda What should I tell my care team before  I take this medication? They need to know if you have any of these conditions: autoimmune diseases like Crohn's disease, ulcerative colitis, or lupus have had or planning to have an allogeneic stem cell transplant (uses someone else's stem cells) history of organ  transplant history of chest radiation nervous system problems like myasthenia gravis or Guillain-Barre syndrome an unusual or allergic reaction to pembrolizumab, other medicines, foods, dyes, or preservatives pregnant or trying to get pregnant breast-feeding How should I use this medication? This medicine is for infusion into a vein. It is given by a health careprofessional in a hospital or clinic setting. A special MedGuide will be given to you before each treatment. Be sure to readthis information carefully each time. Talk to your pediatrician regarding the use of this medicine in children. While this drug may be prescribed for children as young as 6 months for selectedconditions, precautions do apply. Overdosage: If you think you have taken too much of this medicine contact apoison control center or emergency room at once. NOTE: This medicine is only for you. Do not share this medicine with others. What if I miss a dose? It is important not to miss your dose. Call your doctor or health careprofessional if you are unable to keep an appointment. What may interact with this medication? Interactions have not been studied. This list may not describe all possible interactions. Give your health care provider a list of all the medicines, herbs, non-prescription drugs, or dietary supplements you use. Also tell them if you smoke, drink alcohol, or use illegaldrugs. Some items may interact with your medicine. What should I watch for while using this medication? Your condition will be monitored carefully while you are receiving thismedicine. You may need blood work done while you are taking this medicine. Do not become pregnant while taking this medicine or for 4 months after stopping it. Women should inform their doctor if they wish to become pregnant or think they might be pregnant. There is a potential for serious side effects to an unborn child. Talk to your health care professional or pharmacist for  more information. Do not breast-feed an infant while taking this medicine orfor 4 months after the last dose. What side effects may I notice from receiving this medication? Side effects that you should report to your doctor or health care professionalas soon as possible: allergic reactions like skin rash, itching or hives, swelling of the face, lips, or tongue bloody or black, tarry breathing problems changes in vision chest pain chills confusion constipation cough diarrhea dizziness or feeling faint or lightheaded fast or irregular heartbeat fever flushing joint pain low blood counts - this medicine may decrease the number of white blood cells, red blood cells and platelets. You may be at increased risk for infections and bleeding. muscle pain muscle weakness pain, tingling, numbness in the hands or feet persistent headache redness, blistering, peeling or loosening of the skin, including inside the mouth signs and symptoms of high blood sugar such as dizziness; dry mouth; dry skin; fruity breath; nausea; stomach pain; increased hunger or thirst; increased urination signs and symptoms of kidney injury like trouble passing urine or change in the amount of urine signs and symptoms of liver injury like dark urine, light-colored stools, loss of appetite, nausea, right upper belly pain, yellowing of the eyes or skin sweating swollen lymph nodes weight loss Side effects that usually do not require medical attention (report to yourdoctor or health care professional if they continue or are bothersome): decreased appetite  hair loss tiredness This list may not describe all possible side effects. Call your doctor for medical advice about side effects. You may report side effects to FDA at1-800-FDA-1088. Where should I keep my medication? This drug is given in a hospital or clinic and will not be stored at home. NOTE: This sheet is a summary. It may not cover all possible information. If you  have questions about this medicine, talk to your doctor, pharmacist, orhealth care provider.  2022 Elsevier/Gold Standard (2019-05-28 21:44:53)

## 2020-12-21 NOTE — Progress Notes (Signed)
Patient denies new problems/concerns today.   °

## 2020-12-21 NOTE — Progress Notes (Signed)
Nutrition Follow-up:  Patient with stage IV squamous cell lung cancer.  Patient receiving keytruda.   Met with patient during infusion.  Patient continues to drink 2 ensure per day. Reports that he is eating breakfast 3 times per week.  Says that he is eating lunch everyday and evening meal.  Denies any nutrition impact symptoms.      Medications: reviewed  Labs: reviewed  Anthropometrics:   Weight 138 lb increased  135 lb on 5/24 136 lb on 4/12 137 lb on 3/21  145-151 lb UBW   NUTRITION DIAGNOSIS: Inadequate oral intake improved    INTERVENTION:  Continue to drink ensure shakes, ideally TID for more calories and protein to help patient increase weight. Recommend increase frequency of eating breakfast to add more calories and protein    MONITORING, EVALUATION, GOAL: weight trends, intake   NEXT VISIT: as needed  Joli B. Allen, RD, LDN Registered Dietitian [phone] (mobile)   

## 2020-12-21 NOTE — Progress Notes (Signed)
OK to use port for Keytruda today per Dr. Tasia Catchings. Dye study to be scheduled prior to next tx.

## 2020-12-21 NOTE — Progress Notes (Signed)
Hematology/Oncology Follow up note [facility] Telephone:([phone] Fax:([phone]) [phone]   Patient Care Team: Tracie Harrier, MD as PCP - General (Internal Medicine) Earlie Server, MD as Consulting Physician (Hematology and Oncology)  REASON FOR VISIT:  Follow up for treatment of squamous lung cancer.   HISTORY OF PRESENTING ILLNESS:  # Dec 2019 Stage I squamous lung cancer #s/p Bronchoscopy on 06/25/2018. subcarina EBUS FNA was non diagnostic, hypocellular specimen.  # 08/13/2018 CT guided right upper lobe biopsy pathology showed dense fibrosis and mixed inflammatory cells with prominent polytypic plasma cells component. Focal benign bronchial wall and alveolar spaces. No malignancy was identified.   # 08/13/2018 underwent right thoracotomy and wedge resection of a right upper lobe mass.  Frozen section was consistent with an inflammatory process.  On the second postop day, preliminary pathology reports high-grade malignancy.  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  08/20/2018 Patient was therefore taken back to the OR and underwent take complete lobectomy  pT1b pN0 cM0 stage I squamous cell lung cancer.  Margin is negative.  Recommend observation.    # 12/03/2018 CT chest w contrast showed local recurrence.  12/10/2018 PET showed No evidence of distant metastatic disease # 12/26/2018 s/p bronchoscopy biopsy. Confirmed local recurrence of squamous cell lung cancer.  medi port placed by Dr.Oaks.  # 06/14/2020 CT chest w contrast was reviewed and discussed with patient.  Evidence of progressive lymphangitic spread of tumor throughout the right lung, with contralateral lung nodules with  right pleural effusion  MRI brain is negative for CNS involvement.  CT abdomen with contrast showed no evidence of abdominal metastatic disease PET scan was not approved by insurance-peer to peer appeal with Dr.Vipul Bhanderi   # NGS - KRAS V14L,  TMB 26.6-high, MS stable, PD-L1  5% CDKN2C G48, DICER1c.2117-1G>T, FLT4 G1131S, NF1 A452f, NF1 L2643*, STK11 N1838fCancer treatment Started concurrent chemoradiation on 01/16/2019 Carboplatin AUC of 2 and Taxol 45 mg/m2 weekly finished in 03/05/2019 04/10/2019, patient started on durvalumab maintenance. -CT showed progression.  Insurance denied PET scan evaluation. 07/16/2020-09/28/2020 4 cycles of carboplatin/paclitaxel/Keytruda  10/15/2020, CT chest abdomen pelvis redemonstrated postoperative and postradiation appearance of the right chest with perihilar consolidation and fibrosis.  Slight interval decrease in size of the pulmonary nodules associated with interlobular septal thickening nearly complete resolution of previously seen left-sided pulmonary nodules.  Minimal irregular residual.  Right pleural effusion improved.  Consistent with treatment response.  No evidence of metastatic disease with the abdomen or pelvis.  None obstructive bilateral nephrolithiasis - partial response to the treatment- 10/19/2020, Keytruda monotherapy maintenance  INTERVAL HISTORY Samuel DIMMICKs a [age].o. male who has above history reviewed by me today presents for reatment of  recurrent  squamous cell lung cancer. Patient has been on Keytruda maintenance. Patient has no new complaints.  He denies any cough, shortness of breath.  Skin rash or diarrhea      Review of Systems  Constitutional:  Positive for fatigue. Negative for appetite change, chills, fever and unexpected weight change.  HENT:   Negative for hearing loss and voice change.   Eyes:  Negative for eye problems and icterus.  Respiratory:  Negative for chest tightness, cough and shortness of breath.   Cardiovascular:  Negative for chest pain and leg swelling.  Gastrointestinal:  Negative for abdominal distention, abdominal pain and nausea.  Endocrine: Negative for hot flashes.  Genitourinary:  Negative for difficulty urinating, dysuria and frequency.   Musculoskeletal:  Negative for  arthralgias.  Skin:  Negative for itching and rash.  Neurological:  Negative for light-headedness and numbness.  Hematological:  Negative for adenopathy. Does not bruise/bleed easily.  Psychiatric/Behavioral:  Negative for confusion.    MEDICAL HISTORY:  Past Medical History:  Diagnosis Date   Alcohol abuse    usually drinks 2-3 drinks per day   Atherosclerosis 06/2018   Chronic sinusitis    Dehydration 02/07/2019   Emphysema of lung ([place]) 06/2018   patient unaware of this.   Hip fracture ([place]) 05/2018   no surgery   History of kidney stones 05/2018   per xray, bilateral nephrolitiasis   Hypertension    Squamous cell carcinoma of lung, right ([place]) 06/2018    SURGICAL HISTORY: Past Surgical History:  Procedure Laterality Date   BRAIN SURGERY  10/2017   nasal/sinus endoscopy. mass benign   ELECTROMAGNETIC NAVIGATION BROCHOSCOPY Right 06/25/2018   Procedure: ELECTROMAGNETIC NAVIGATION BRONCHOSCOPY;  Surgeon: [name], MD;  Location: [facility] OR[facility];  Service: Cardiopulmonary;  Laterality: Right;   ENDOBRONCHIAL ULTRASOUND Right 06/25/2018   Procedure: ENDOBRONCHIAL ULTRASOUND;  Surgeon: [name], MD;  Location: [facility];  Service: Cardiopulmonary;  Laterality: Right;   ENDOBRONCHIAL ULTRASOUND Right 12/26/2018   Procedure: ENDOBRONCHIAL ULTRASOUND RIGHT;  Surgeon: [name], MD;  Location: [facility];  Service: Cardiopulmonary;  Laterality: Right;   FLEXIBLE BRONCHOSCOPY N/A 08/20/2018   Procedure: FLEXIBLE BRONCHOSCOPY PREOP;  Surgeon: [name], MD;  Location: [facility] OR[facility];  Service: Thoracic;  Laterality: N/A;   IR CV LINE INJECTION  04/04/2019   NASAL SINUS SURGERY  10/2017   At [facility], frontal sinusotomy, ethmoidectomy, resection anterior cranial fossa neoplasm, turbinate resection   PORTACATH PLACEMENT Left 01/15/2019   Procedure: INSERTION PORT-A-CATH;  Surgeon: [name], MD;  Location: [facility] OR[facility];  Service: General;  Laterality: Left;   THORACOTOMY Right 08/13/2018    Procedure: PREOP BROCHOSCOPY WITH RIGHT THORACOTOMY AND RUL RESECTION;  Surgeon: [name], MD;  Location: [facility];  Service: General;  Laterality: Right;   THORACOTOMY Right 08/20/2018   Procedure: THORACOTOMY MAJOR RIGHT UPPER LOBE LOBECTOMY;  Surgeon: [name], MD;  Location: [facility];  Service: Thoracic;  Laterality: Right;   TOE SURGERY Left    pin in left toe    SOCIAL HISTORY: Social History   Socioeconomic History   Marital status: Married    Spouse name: lisa   Number of children: Not on file   Years of education: Not on file   Highest education level: Not on file  Occupational History   Occupation: welding    Comment: taking time off to resolve issues  Tobacco Use   Smoking status: Former    Packs/day: 0.50    Years: 15.00    Pack years: 7.50    Types: Cigarettes    Quit date: 08/2018    Years since quitting: 2.3   Smokeless tobacco: Never  Vaping Use   Vaping Use: Never used  Substance and Sexual Activity   Alcohol use: Yes    Alcohol/week: 3.0 standard drinks    Types: 3 Cans of beer per week    Comment: usually 2 drinks per week, per patient   Drug use: No   Sexual activity: Not on file  Other Topics Concern   Not on file  Social History Narrative   Not on file   Social Determinants of Health   Financial Resource Strain: Not on file  Food Insecurity: Not on file  Transportation Needs: Not on file  Physical Activity: Not on file  Stress:  Not on file  Social Connections: Not on file  Intimate Partner Violence: Not on file    FAMILY HISTORY: Family History  Problem Relation Age of Onset   Breast cancer Mother    Diabetes Mother    Lung cancer Father    Hypertension Father     ALLERGIES:  has No Known Allergies.  MEDICATIONS:  Current Outpatient Medications  Medication Sig Dispense Refill   albuterol (VENTOLIN HFA) 108 (90 Base) MCG/ACT inhaler TAKE 2 PUFFS BY MOUTH EVERY 6 HOURS AS NEEDED FOR WHEEZE OR SHORTNESS OF BREATH 6.7 each  6   cloNIDine (CATAPRES) 0.1 MG tablet Take 0.1 mg by mouth daily.   5   diltiazem (CARDIZEM CD) 120 MG 24 hr capsule Take 120 mg by mouth daily.     folic acid (V-R FOLIC ACID) 353 MCG tablet Take 1 tablet (400 mcg total) by mouth daily. 90 tablet 1   hydrALAZINE (APRESOLINE) 100 MG tablet Take 50 mg by mouth 2 (two) times daily.      hydrochlorothiazide (HYDRODIURIL) 12.5 MG tablet Take 12.5 mg by mouth daily.      ibuprofen (ADVIL) 600 MG tablet Take 600 mg by mouth every 6 (six) hours.     KLOR-CON M20 20 MEQ tablet Take 2 tablets (40 mEq total) by mouth daily. 180 tablet 1   latanoprost (XALATAN) 0.005 % ophthalmic solution 1 drop at bedtime.     lidocaine-prilocaine (EMLA) cream Apply to affected area once 30 g 3   loratadine (CLARITIN) 10 MG tablet Take 10 mg by mouth daily.     magnesium chloride (SLOW-MAG) 64 MG TBEC SR tablet Take 1 tablet (64 mg total) by mouth 2 (two) times daily. TAKE 1 TABLET (64 MG TOTAL) BY MOUTH DAILY. 60 tablet 1   methocarbamol (ROBAXIN) 500 MG tablet Take 1 tablet (500 mg total) by mouth at bedtime. 30 tablet 0   nystatin (MYCOSTATIN) 100000 UNIT/ML suspension Take 5 mLs (500,000 Units total) by mouth 4 (four) times daily. Swish and spit. 473 mL 0   olmesartan (BENICAR) 40 MG tablet Take 40 mg by mouth every other day.      ondansetron (ZOFRAN) 8 MG tablet Take 1 tablet (8 mg total) by mouth every 8 (eight) hours as needed for refractory nausea / vomiting. Start on day 3 after chemo. 90 tablet 3   prochlorperazine (COMPAZINE) 10 MG tablet Take 1 tablet (10 mg total) by mouth every 6 (six) hours as needed (Nausea or vomiting). 90 tablet 3   SPIRIVA HANDIHALER 18 MCG inhalation capsule INHALE 1 CAPSULE VIA HANDIHALER ONCE DAILY AT THE SAME TIME EVERY DAY 30 capsule 0   SYMBICORT 80-4.5 MCG/ACT inhaler INHALE 2 PUFFS INTO THE LUNGS IN THE MORNING AND AT BEDTIME. 10.2 each 6   vitamin B-12 (CYANOCOBALAMIN) 1000 MCG tablet Take 1 tablet (1,000 mcg total) by mouth  daily. 90 tablet 1   No current facility-administered medications for this visit.   Facility-Administered Medications Ordered in Other Visits  Medication Dose Route Frequency Provider Last Rate Last Admin   albuterol (PROVENTIL) (2.5 MG/3ML) 0.083% nebulizer solution 2.5 mg  2.5 mg Nebulization Once System, Provider Not In       heparin lock flush 100 unit/mL  500 Units Intravenous Once Earlie Server, MD       heparin lock flush 100 unit/mL  500 Units Intravenous Once Earlie Server, MD       sodium chloride flush (NS) 0.9 % injection 10 mL  10 mL Intravenous  PRN Earlie Server, MD   10 mL at 04/04/19 0903   sodium chloride flush (NS) 0.9 % injection 10 mL  10 mL Intravenous PRN Earlie Server, MD   10 mL at 07/26/20 1308   sodium chloride flush (NS) 0.9 % injection 10 mL  10 mL Intravenous PRN Earlie Server, MD         PHYSICAL EXAMINATION: ECOG PERFORMANCE STATUS: 1 - Symptomatic but completely ambulatory Vitals:   12/21/20 0933  BP: 118/75  Pulse: (!) 106  Resp: 16  Temp: 97.9 F (36.6 C)   Filed Weights   12/21/20 0933  Weight: 138 lb (62.6 kg)    Physical Exam Constitutional:      General: He is not in acute distress. HENT:     Head: Normocephalic and atraumatic.  Eyes:     General: No scleral icterus. Cardiovascular:     Rate and Rhythm: Normal rate and regular rhythm.     Heart sounds: Normal heart sounds.  Pulmonary:     Effort: Pulmonary effort is normal. No respiratory distress.     Breath sounds: No wheezing.  Abdominal:     General: Bowel sounds are normal. There is no distension.     Palpations: Abdomen is soft.  Musculoskeletal:        General: No deformity. Normal range of motion.     Cervical back: Normal range of motion and neck supple.  Skin:    General: Skin is warm and dry.     Findings: No erythema or rash.  Neurological:     Mental Status: He is alert and oriented to person, place, and time. Mental status is at baseline.     Cranial Nerves: No cranial nerve deficit.      Coordination: Coordination normal.  Psychiatric:        Mood and Affect: Mood normal.     LABORATORY DATA:  I have reviewed the data as listed Lab Results  Component Value Date   WBC 12.5 (H) 12/21/2020   HGB 15.6 12/21/2020   HCT 43.6 12/21/2020   MCV 108.2 (H) 12/21/2020   PLT 116 (L) 12/21/2020   Recent Labs    03/17/20 0830 03/31/20 0825 04/07/20 0854 04/22/20 0825 08/31/20 0812 09/07/20 1117 11/09/20 0810 11/30/20 0806 12/21/20 0854  NA 134* 137 137   < > 137   < > 135 138 138  K 4.6 3.9 3.7   < > 3.1*   < > 3.1* 4.5 4.5  CL 101 102 104   < > 100   < > 98 100 102  CO2 20* 23 21*   < > 25   < > _0 GLUCOSE 167* 126* 163*   < > 107*   < > 163* 129* 81  BUN _1 < > 7   < > _2 CREATININE 1.19 1.10 1.10   < > 0.79   < > 0.86 1.07 1.01  CALCIUM 9.0 9.1 8.8*   < > 8.9   < > 8.9 9.8 9.4  GFRNONAA >60 >60 >60   < > >60   < > >60 >60 >60  GFRAA >60 >60 >60  --   --   --   --   --   --   PROT 7.7 7.6 7.1   < > 7.4   < > 7.3 8.3* 7.9  ALBUMIN 3.5 3.5 3.4*   < > 3.4*   < > 3.5 4.1  3.7  AST 32 29 25   < > 31   < > 25 32 24  ALT _0 < > 13   < > _1 ALKPHOS 86 87 76   < > 101   < > 83 107 94  BILITOT 0.8 0.7 0.5   < > 2.0*   < > 1.6* 1.1 0.4  BILIDIR  --   --   --   --  0.4*  --   --   --   --    < > = values in this interval not displayed.    Iron/TIBC/Ferritin/ %Sat No results found for: IRON, TIBC, FERRITIN, IRONPCTSAT   RADIOGRAPHIC STUDIES: I have personally reviewed the radiological images as listed and agreed with the findings in the report. CT Chest Wo Contrast  Result Date: 12/16/2020 CLINICAL DATA:  Lung cancer surveillance. EXAM: CT CHEST WITHOUT CONTRAST TECHNIQUE: Multidetector CT imaging of the chest was performed following the standard protocol without IV contrast. COMPARISON:  Multiple prior examinations. The most recent is 10/15/2020. FINDINGS: Cardiovascular: The heart is normal in size. No pericardial effusion. Stable  tortuosity and calcification of the thoracic aorta and stable scattered coronary artery calcifications. Mediastinum/Nodes: No mediastinal or hilar mass or overt adenopathy. The esophagus is grossly. The left-sided Port-A-Cath is in good position, unchanged. Lungs/Pleura: Stable underlying emphysematous changes. Stable postoperative and post radiation changes involving the right hilum and right paramediastinal lung with dense radiation fibrosis associated bronchiectasis. I do not see any findings suspicious for recurrent tumor. No worrisome pulmonary nodules to suggest pulmonary metastatic disease. Small pulmonary nodules identified on more remote chest CT scans have resolved. No new pulmonary lesions. No pleural effusions or pleural lesions. Upper Abdomen: No significant upper abdominal findings. No obvious hepatic or adrenal gland lesions. Stable aortic calcifications and bilateral renal calculi. Musculoskeletal: No chest wall mass, supraclavicular or axillary adenopathy. The bony thorax is intact. IMPRESSION: 1. Stable postoperative and post radiation changes involving the right hilum and right paramediastinal lung. No findings suspicious for recurrent tumor. 2. No mediastinal or hilar mass or adenopathy. 3. Stable emphysematous changes. 4. Stable atherosclerotic calcifications involving the aorta and coronary arteries. 5. Emphysema and aortic atherosclerosis. 6. Small pulmonary nodules identified on more remote chest CT scans have resolved. No new or worrisome pulmonary lesions. Aortic Atherosclerosis (ICD10-I70.0) and Emphysema (ICD10-J43.9). Electronically Signed   By: Marijo Sanes M.D.   On: 12/16/2020 11:44   CT CHEST ABDOMEN PELVIS W CONTRAST  Result Date: 10/16/2020 CLINICAL DATA:  Lung cancer, restaging, assess treatment response EXAM: CT CHEST, ABDOMEN, AND PELVIS WITH CONTRAST TECHNIQUE: Multidetector CT imaging of the chest, abdomen and pelvis was performed following the standard protocol during  bolus administration of intravenous contrast. CONTRAST:  164m OMNIPAQUE IOHEXOL 300 MG/ML SOLN, additional oral enteric contrast COMPARISON:  07/06/2020 FINDINGS: CT CHEST FINDINGS Cardiovascular: Left chest port catheter. Aortic atherosclerosis. Normal heart size. Left coronary artery calcifications. No pericardial effusion. Mediastinum/Nodes: Unchanged soft thickening about the right hilum. No discretely enlarged mediastinal, hilar, or axillary lymph nodes. Thyroid gland, trachea, and esophagus demonstrate no significant findings. Lungs/Pleura: Redemonstrated postoperative and post appearance of the right chest status post right upper lobectomy with perihilar consolidation and fibrosis. Slight interval decrease in size of pulmonary nodules associated with interlobular septal thickening in the right lung, an index nodule measuring 0.7 x 0.4 cm, previously 1.1 x 0.6 cm (series 4, image 55). There has been nearly complete resolution of previously seen left-sided  pulmonary nodules, with minimal irregular residua, for example in the left lower lobe measuring approximately 2 mm, previously 5 mm (series 4, image 104). Trace right pleural effusion, improved compared to prior examination. Musculoskeletal: No chest wall mass or suspicious bone lesions identified. CT ABDOMEN PELVIS FINDINGS Hepatobiliary: No solid liver abnormality is seen. No gallstones, gallbladder wall thickening, or biliary dilatation. Pancreas: Unremarkable. No pancreatic ductal dilatation or surrounding inflammatory changes. Spleen: Normal in size without significant abnormality. Adrenals/Urinary Tract: Adrenal glands are unremarkable. Multiple punctuate bilateral nonobstructive renal calculi. No ureteral calculi or hydronephrosis. Bladder is unremarkable. Stomach/Bowel: Stomach is within normal limits. Appendix appears normal. No evidence of bowel wall thickening, distention, or inflammatory changes. Descending and sigmoid diverticulosis.  Vascular/Lymphatic: Aortic atherosclerosis. No enlarged abdominal or pelvic lymph nodes. Reproductive: No mass or other abnormality. Other: No abdominal wall hernia or abnormality. No abdominopelvic ascites. Musculoskeletal: No acute or significant osseous findings. IMPRESSION: 1. Redemonstrated postoperative and post appearance of the right chest status post right upper lobectomy with perihilar consolidation and fibrosis. 2. Slight interval decrease in size of pulmonary nodules associated with interlobular septal thickening in the right lung. 3. There has been nearly complete resolution of previously seen left-sided pulmonary nodules, with minimal irregular residua. 4. Trace right pleural effusion, improved compared to prior examination. 5. Findings are consistent with treatment response of pulmonary metastatic disease. 6. No evidence of metastatic disease within the abdomen or pelvis. 7. Nonobstructive bilateral nephrolithiasis. 8. Coronary artery disease. Aortic Atherosclerosis (ICD10-I70.0). Electronically Signed   By: Eddie Candle M.D.   On: 10/16/2020 21:01      ASSESSMENT & PLAN:  1. Squamous cell carcinoma of lung, right ([place])   2. Encounter for antineoplastic immunotherapy   3. Port-A-Cath in place   4. Hypokalemia   5. Hypomagnesemia    #Recurrent Squamous cell carcinoma of lung, stage IV S/p carboplatin Taxol and Keytruda, status post 4 cycles- partial response  Currently on maintenance immunotherapy Keytruda  Labs are reviewed and discussed with patient Proceed with Keytruda today  12/15/2020, CT chest without contrast showed resolution of lung nodules, no evidence of recurrent disease  #Chronic hypomagnesia.  Continue slow magnesium twice daily.   #Chronic hypokalemia, potassium 4.5 today.  potassium chloride 20 mEq twice daily.   #Chemotherapy-induced thrombocytopenia, monitor #Port-A-Cath in place, patient's Mediport flushes easily, no blood return.  Will obtain dye study Return of  visit:  Follow-up in 3 weeks.   Earlie Server, MD, PhD Hematology Oncology [facility] 12/21/2020

## 2020-12-30 ENCOUNTER — Inpatient Hospital Stay
Admission: EM | Admit: 2020-12-30 | Discharge: 2021-01-02 | DRG: 271 | Disposition: A | Discharge status: Home / Self Care | Payer: 59 | Attending: Internal Medicine | Admitting: Internal Medicine

## 2020-12-30 ENCOUNTER — Encounter: Payer: Self-pay | Admitting: Physician Assistant

## 2020-12-30 ENCOUNTER — Emergency Department: Payer: 59

## 2020-12-30 ENCOUNTER — Other Ambulatory Visit: Payer: Self-pay

## 2020-12-30 ENCOUNTER — Encounter: Payer: Self-pay | Admitting: Oncology

## 2020-12-30 DIAGNOSIS — I82432 Acute embolism and thrombosis of left popliteal vein: Secondary | ICD-10-CM | POA: Diagnosis present

## 2020-12-30 DIAGNOSIS — Z833 Family history of diabetes mellitus: Secondary | ICD-10-CM

## 2020-12-30 DIAGNOSIS — R000 Tachycardia, unspecified: Secondary | ICD-10-CM

## 2020-12-30 DIAGNOSIS — Z86718 Personal history of other venous thrombosis and embolism: Secondary | ICD-10-CM | POA: Diagnosis not present

## 2020-12-30 DIAGNOSIS — Z7951 Long term (current) use of inhaled steroids: Secondary | ICD-10-CM

## 2020-12-30 DIAGNOSIS — J439 Emphysema, unspecified: Secondary | ICD-10-CM | POA: Diagnosis present

## 2020-12-30 DIAGNOSIS — I82402 Acute embolism and thrombosis of unspecified deep veins of left lower extremity: Secondary | ICD-10-CM

## 2020-12-30 DIAGNOSIS — I491 Atrial premature depolarization: Secondary | ICD-10-CM | POA: Diagnosis present

## 2020-12-30 DIAGNOSIS — Z791 Long term (current) use of non-steroidal anti-inflammatories (NSAID): Secondary | ICD-10-CM

## 2020-12-30 DIAGNOSIS — Z87891 Personal history of nicotine dependence: Secondary | ICD-10-CM

## 2020-12-30 DIAGNOSIS — Z801 Family history of malignant neoplasm of trachea, bronchus and lung: Secondary | ICD-10-CM

## 2020-12-30 DIAGNOSIS — I10 Essential (primary) hypertension: Secondary | ICD-10-CM | POA: Diagnosis present

## 2020-12-30 DIAGNOSIS — I071 Rheumatic tricuspid insufficiency: Secondary | ICD-10-CM | POA: Diagnosis present

## 2020-12-30 DIAGNOSIS — Z8249 Family history of ischemic heart disease and other diseases of the circulatory system: Secondary | ICD-10-CM | POA: Diagnosis not present

## 2020-12-30 DIAGNOSIS — I472 Ventricular tachycardia: Secondary | ICD-10-CM

## 2020-12-30 DIAGNOSIS — I459 Conduction disorder, unspecified: Secondary | ICD-10-CM | POA: Diagnosis present

## 2020-12-30 DIAGNOSIS — E876 Hypokalemia: Secondary | ICD-10-CM | POA: Diagnosis present

## 2020-12-30 DIAGNOSIS — I82409 Acute embolism and thrombosis of unspecified deep veins of unspecified lower extremity: Secondary | ICD-10-CM | POA: Diagnosis present

## 2020-12-30 DIAGNOSIS — J309 Allergic rhinitis, unspecified: Secondary | ICD-10-CM | POA: Diagnosis present

## 2020-12-30 DIAGNOSIS — I82422 Acute embolism and thrombosis of left iliac vein: Secondary | ICD-10-CM | POA: Diagnosis present

## 2020-12-30 DIAGNOSIS — Z79899 Other long term (current) drug therapy: Secondary | ICD-10-CM | POA: Diagnosis not present

## 2020-12-30 DIAGNOSIS — Z803 Family history of malignant neoplasm of breast: Secondary | ICD-10-CM

## 2020-12-30 DIAGNOSIS — J431 Panlobular emphysema: Secondary | ICD-10-CM

## 2020-12-30 DIAGNOSIS — Z87442 Personal history of urinary calculi: Secondary | ICD-10-CM | POA: Diagnosis not present

## 2020-12-30 DIAGNOSIS — F1010 Alcohol abuse, uncomplicated: Secondary | ICD-10-CM | POA: Diagnosis present

## 2020-12-30 DIAGNOSIS — C3491 Malignant neoplasm of unspecified part of right bronchus or lung: Secondary | ICD-10-CM | POA: Diagnosis present

## 2020-12-30 DIAGNOSIS — Z20822 Contact with and (suspected) exposure to covid-19: Secondary | ICD-10-CM | POA: Diagnosis present

## 2020-12-30 DIAGNOSIS — I471 Supraventricular tachycardia: Secondary | ICD-10-CM | POA: Diagnosis present

## 2020-12-30 DIAGNOSIS — M7989 Other specified soft tissue disorders: Secondary | ICD-10-CM | POA: Diagnosis present

## 2020-12-30 DIAGNOSIS — I82412 Acute embolism and thrombosis of left femoral vein: Secondary | ICD-10-CM | POA: Diagnosis present

## 2020-12-30 HISTORY — DX: Acute embolism and thrombosis of unspecified deep veins of left lower extremity: I82.402

## 2020-12-30 LAB — BASIC METABOLIC PANEL
Anion gap: 11 (ref 5–15)
BUN: 15 mg/dL (ref 6–20)
CO2: 23 mmol/L (ref 22–32)
Calcium: 9.7 mg/dL (ref 8.9–10.3)
Chloride: 103 mmol/L (ref 98–111)
Creatinine, Ser: 0.97 mg/dL (ref 0.61–1.24)
GFR, Estimated: >60 mL/min (ref >60)
Glucose, Bld: 94 mg/dL (ref 70–99)
Potassium: 4.2 mmol/L (ref 3.5–5.1)
Sodium: 137 mmol/L (ref 135–145)

## 2020-12-30 LAB — CBC WITH DIFFERENTIAL/PLATELET
Abs Immature Granulocytes: 0.04 10*3/uL (ref 0.00–0.07)
Basophils Absolute: 0.1 10*3/uL (ref 0.0–0.1)
Basophils Relative: 1 %
Eosinophils Absolute: 0.2 10*3/uL (ref 0.0–0.5)
Eosinophils Relative: 2 %
HCT: 45.2 % (ref 39.0–52.0)
Hemoglobin: 16.2 g/dL (ref 13.0–17.0)
Immature Granulocytes: 0 %
Lymphocytes Relative: 11 %
Lymphs Abs: 1.2 10*3/uL (ref 0.7–4.0)
MCH: 38.5 pg — ABNORMAL HIGH (ref 26.0–34.0)
MCHC: 35.8 g/dL (ref 30.0–36.0)
MCV: 107.4 fL — ABNORMAL HIGH (ref 80.0–100.0)
Monocytes Absolute: 0.8 10*3/uL (ref 0.1–1.0)
Monocytes Relative: 8 %
Neutro Abs: 8.5 10*3/uL — ABNORMAL HIGH (ref 1.7–7.7)
Neutrophils Relative %: 78 %
Platelets: 146 10*3/uL — ABNORMAL LOW (ref 150–400)
RBC: 4.21 MIL/uL — ABNORMAL LOW (ref 4.22–5.81)
RDW: 11.9 % (ref 11.5–15.5)
WBC: 10.8 10*3/uL — ABNORMAL HIGH (ref 4.0–10.5)
nRBC: 0 % (ref 0.0–0.2)

## 2020-12-30 LAB — PROTIME-INR
INR: 1 (ref 0.8–1.2)
Prothrombin Time: 12.8 seconds (ref 11.4–15.2)

## 2020-12-30 LAB — RESP PANEL BY RT-PCR (FLU A&B, COVID) ARPGX2
Influenza A by PCR: NEGATIVE
Influenza B by PCR: NEGATIVE
SARS Coronavirus 2 by RT PCR: NEGATIVE

## 2020-12-30 LAB — MAGNESIUM: Magnesium: 1.6 mg/dL — ABNORMAL LOW (ref 1.7–2.4)

## 2020-12-30 LAB — APTT: aPTT: 32 seconds (ref 24–36)

## 2020-12-30 MED ORDER — HEPARIN (PORCINE) 25000 UT/250ML-% IV SOLN
1250.0000 [IU]/h | INTRAVENOUS | Status: DC
  Administered 2020-12-30 – 2020-12-31 (×2): 1150 [IU]/h via INTRAVENOUS
  Filled 2020-12-30 (×2): qty 250

## 2020-12-30 MED ORDER — HEPARIN BOLUS VIA INFUSION
4000.0000 [IU] | Freq: Once | INTRAVENOUS | Status: AC
  Administered 2020-12-30: 4000 [IU] via INTRAVENOUS
  Filled 2020-12-30: qty 4000

## 2020-12-30 MED ORDER — LORATADINE 10 MG PO TABS
10.0000 mg | ORAL_TABLET | Freq: Every day | ORAL | Status: DC
  Administered 2020-12-31 – 2021-01-02 (×3): 10 mg via ORAL
  Filled 2020-12-30 (×4): qty 1

## 2020-12-30 MED ORDER — ACETAMINOPHEN 650 MG RE SUPP: 650.0000 mg | Freq: Four times a day (QID) | RECTAL | Status: DC | PRN

## 2020-12-30 MED ORDER — IRBESARTAN 150 MG PO TABS
300.0000 mg | ORAL_TABLET | ORAL | Status: DC
  Administered 2021-01-01: 300 mg via ORAL
  Filled 2020-12-30: qty 2

## 2020-12-30 MED ORDER — HYDRALAZINE HCL 50 MG PO TABS
50.0000 mg | ORAL_TABLET | Freq: Two times a day (BID) | ORAL | Status: DC
  Administered 2020-12-30 – 2021-01-02 (×6): 50 mg via ORAL
  Filled 2020-12-30 (×7): qty 1

## 2020-12-30 MED ORDER — ONDANSETRON HCL 4 MG/2ML IJ SOLN: 4.0000 mg | Freq: Four times a day (QID) | INTRAMUSCULAR | Status: DC | PRN

## 2020-12-30 MED ORDER — HYDROCODONE-ACETAMINOPHEN 5-325 MG PO TABS
1.0000 | ORAL_TABLET | Freq: Four times a day (QID) | ORAL | Status: DC | PRN
  Administered 2020-12-31 – 2021-01-02 (×4): 1 via ORAL
  Filled 2020-12-30 (×5): qty 1

## 2020-12-30 MED ORDER — TIOTROPIUM BROMIDE MONOHYDRATE 18 MCG IN CAPS
18.0000 ug | ORAL_CAPSULE | Freq: Every day | RESPIRATORY_TRACT | Status: DC
  Administered 2020-12-31 – 2021-01-02 (×3): 18 ug via RESPIRATORY_TRACT
  Filled 2020-12-30: qty 5

## 2020-12-30 MED ORDER — ALBUTEROL SULFATE (2.5 MG/3ML) 0.083% IN NEBU: 2.5000 mg | INHALATION_SOLUTION | Freq: Four times a day (QID) | RESPIRATORY_TRACT | Status: DC | PRN

## 2020-12-30 MED ORDER — METHOCARBAMOL 500 MG PO TABS
500.0000 mg | ORAL_TABLET | Freq: Every day | ORAL | Status: DC
  Administered 2020-12-31 – 2021-01-01 (×3): 500 mg via ORAL
  Filled 2020-12-30 (×4): qty 1

## 2020-12-30 MED ORDER — ACETAMINOPHEN 325 MG PO TABS: 650.0000 mg | ORAL_TABLET | Freq: Four times a day (QID) | ORAL | Status: DC | PRN

## 2020-12-30 MED ORDER — MOMETASONE FURO-FORMOTEROL FUM 100-5 MCG/ACT IN AERO
2.0000 | INHALATION_SPRAY | Freq: Two times a day (BID) | RESPIRATORY_TRACT | Status: DC
  Administered 2020-12-31 – 2021-01-02 (×5): 2 via RESPIRATORY_TRACT
  Filled 2020-12-30: qty 8.8

## 2020-12-30 NOTE — ED Triage Notes (Signed)
Pt in with co left leg swelling x 3 days. States from thigh to calf, denies hx of peripheral edema. Pt denies any shob or other symptoms. Denies any hx of clots.

## 2020-12-30 NOTE — Progress Notes (Signed)
ANTICOAGULATION CONSULT NOTE - Initial Consult  Pharmacy Consult for Heparin  Indication: DVT  No Known Allergies  Patient Measurements: Height: 6\' 1"  (185.4 cm) Weight: 64 kg (141 lb) IBW/kg (Calculated) : 79.9 Heparin Dosing Weight: 64 kg   Vital Signs: Temp: 98.5 F (36.9 C) (06/23 2009) Temp Source: Oral (06/23 2009) BP: 145/98 (06/23 2009) Pulse Rate: 96 (06/23 2009)  Labs: No results for input(s): HGB, HCT, PLT, APTT, LABPROT, INR, HEPARINUNFRC, HEPRLOWMOCWT, CREATININE, CKTOTAL, CKMB, TROPONINIHS in the last 72 hours.  Estimated Creatinine Clearance: 74.8 mL/min (by C-G formula based on SCr of 1.01 mg/dL).   Medical History: Past Medical History:  Diagnosis Date   Alcohol abuse    usually drinks 2-3 drinks per day   Atherosclerosis 06/2018   Chronic sinusitis    Dehydration 02/07/2019   Emphysema of lung ([place]) 06/2018   patient unaware of this.   Hip fracture ([place]) 05/2018   no surgery   History of kidney stones 05/2018   per xray, bilateral nephrolitiasis   Hypertension    Squamous cell carcinoma of lung, right ([place]) 06/2018    Medications:  (Not in a hospital admission)   Assessment: Pharmacy consulted to dose heparin in this 55 year old male admitted with newly diagnosed DVT.  CrCl = 74.8 ml/min  No prior anticoag noted.   Goal of Therapy:  Heparin level 0.3-0.7 units/ml Monitor platelets by anticoagulation protocol: Yes   Plan:  Give 4000 units bolus x 1 Start heparin infusion at 1150 units/hr Check anti-Xa level in 6 hours and daily while on heparin Continue to monitor H&H and platelets  Lashana Spang D 12/30/2020,10:41 PM

## 2020-12-30 NOTE — H&P (Addendum)
History and Physical    PLEASE NOTE THAT DRAGON DICTATION SOFTWARE WAS USED IN THE CONSTRUCTION OF THIS NOTE.   Samuel Choi IRW:[id number] DOB: [birth year] DOA: 12/30/2020  PCP: Tracie Harrier, MD Patient coming from: home   I have personally briefly reviewed patient's old medical records in [place]  Chief Complaint: Left lower extremity swelling  HPI: Samuel Choi is a 55 y.o. 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   The patient reports 2 weeks of new onset left lower extremity swelling.  He initially noted some associated crampy calf tenderness, but notes subsequent resolution of this symptom, without any residual discomfort in the left lower extremity.  Denies any associated left lower extremity erythema, and denies any acute focal weakness, paresthesias, numbness, or decline in temperature as it relates to the left lower extremity.  Denies any preceding trauma including no crush injury associated with the left lower extremity.  Denies any recent travel or prolonged diminished ambulatory periods.  No recent surgical procedures.  Denies any known personal history of DVT/PE.  The patient does however confirm stage IV squamous cell carcinoma of the right lung for which he is receiving chemotherapy, and following with Dr. Tasia Catchings as his outpatient oncologist.  Denies any history of acute GI bleed, and denies taking any blood thinners as an outpatient, including no aspirin.  Denies any recent melena or hematochezia.  Denies any corresponding swelling involving the right lower extremity.  He denies any recent chest pain, nausea, vomiting, palpitations, diaphoresis, dizziness, presyncope, or syncope.   He reports that the left lower extremity swelling has not been associated with any shortness of breath, new onset cough, hemoptysis, orthopnea, PND, or wheezing.  He also denies any recent subjective fever, chills, rigors, or generalized myalgias.  No recent known COVID-19 exposures.  Denies any recent headache, neck stiffness, sore throat, abdominal pain, or diarrhea.  No recent dysuria or gross hematuria.      ED Course:  Vital signs in the ED were notable for the following: Temperature max 98.5, heart rate 96, blood pressure 145/98, respiratory rate 20, and oxygen saturation 97% on room air.  Labs were notable for the following: BMP was notable for the following: Sodium 137, bicarbonate 23, creatinine 0.97.  CBC notable for white blood cell count of 10,800, relative to most recent prior value of 12,500 on 12/21/2020, hemoglobin 16, and platelets 146 relative to most recent prior value of 116 on 12/21/2020.  INR 1.0, PTT 32.  Nasopharyngeal COVID-19/influenza PCR were performed in the ED this evening, with results currently pending.  Venous ultrasound of the left lower extremity demonstrated extensive DVT noted to be present from the calf veins to the common femoral vein on the left with evidence of associated occlusive qualities.   Case and imaging, including venous ultrasound left lower extremity demonstrating extensive, occlusive DVT were discussed with the on-call vascular surgeon, Dr. Delana Meyer, who recommended admission to the hospital service for further evaluation and management of acute DVT, including initiation of heparin drip, with plan for Dr. Lucky Cowboy of vascular surgery to evaluate the patient in the morning for potential thrombectomy.   While in the ED, pharmacy was formally consulted for assistance with initiation and management of heparin drip.  While  still in the ED, heparin bolus was administered followed by initiation of continuous heparin infusion.    Review of Systems: As per HPI  otherwise 10 point review of systems negative.   Past Medical History:  Diagnosis Date   Alcohol abuse    usually drinks 2-3 drinks per day   Atherosclerosis 06/2018   Chronic sinusitis    Dehydration 02/07/2019   Emphysema of lung ([place]) 06/2018   patient unaware of this.   Hip fracture ([place]) 05/2018   no surgery   History of kidney stones 05/2018   per xray, bilateral nephrolitiasis   Hypertension    Squamous cell carcinoma of lung, right ([place]) 06/2018    Past Surgical History:  Procedure Laterality Date   BRAIN SURGERY  10/2017   nasal/sinus endoscopy. mass benign   ELECTROMAGNETIC NAVIGATION BROCHOSCOPY Right 06/25/2018   Procedure: ELECTROMAGNETIC NAVIGATION BRONCHOSCOPY;  Surgeon: [name], MD;  Location: [facility] ORS;  Service: Cardiopulmonary;  Laterality: Right;   ENDOBRONCHIAL ULTRASOUND Right 06/25/2018   Procedure: ENDOBRONCHIAL ULTRASOUND;  Surgeon: [name], MD;  Location: [facility] ORS;  Service: Cardiopulmonary;  Laterality: Right;   ENDOBRONCHIAL ULTRASOUND Right 12/26/2018   Procedure: ENDOBRONCHIAL ULTRASOUND RIGHT;  Surgeon: [name], MD;  Location: [facility] ORS;  Service: Cardiopulmonary;  Laterality: Right;   FLEXIBLE BRONCHOSCOPY N/A 08/20/2018   Procedure: FLEXIBLE BRONCHOSCOPY PREOP;  Surgeon: [name], MD;  Location: [facility] ORS;  Service: Thoracic;  Laterality: N/A;   IR CV LINE INJECTION  04/04/2019   NASAL SINUS SURGERY  10/2017   At [facility], frontal sinusotomy, ethmoidectomy, resection anterior cranial fossa neoplasm, turbinate resection   PORTACATH PLACEMENT Left 01/15/2019   Procedure: INSERTION PORT-A-CATH;  Surgeon: [name], MD;  Location: [facility] ORS;  Service: General;  Laterality: Left;   THORACOTOMY Right 08/13/2018   Procedure: PREOP BROCHOSCOPY WITH RIGHT THORACOTOMY AND RUL RESECTION;  Surgeon: [name], MD;  Location: [facility] ORS;  Service: General;  Laterality: Right;   THORACOTOMY Right 08/20/2018   Procedure: THORACOTOMY MAJOR RIGHT UPPER  LOBE LOBECTOMY;  Surgeon: [name], MD;  Location: [facility] ORS;  Service: Thoracic;  Laterality: Right;   TOE SURGERY Left    pin in left toe    Social History:  reports that he quit smoking about 2 years ago. His smoking use included cigarettes. He has a 7.50 pack-year smoking history. He has never used smokeless tobacco. He reports current alcohol use of about 3.0 standard drinks of alcohol per week. He reports that he does not use drugs.   No Known Allergies  Family History  Problem Relation Age of Onset   Breast cancer Mother    Diabetes Mother    Lung cancer Father    Hypertension Father     Family history reviewed and not pertinent    Prior to Admission medications   Medication Sig Start Date End Date Taking? Authorizing Provider  albuterol (VENTOLIN HFA) 108 (90 Base) MCG/ACT inhaler TAKE 2 PUFFS BY MOUTH EVERY 6 HOURS AS NEEDED FOR WHEEZE OR SHORTNESS OF BREATH 11/08/20   Earlie Server, MD  cloNIDine (CATAPRES) 0.1 MG tablet Take 0.1 mg by mouth daily.  05/02/18   [provider]  diltiazem (CARDIZEM CD) 120 MG 24 hr capsule Take 120 mg by mouth daily. 06/19/19   [provider]  folic acid (V-R FOLIC ACID) 161 MCG tablet Take 1 tablet (400 mcg total) by mouth daily. 02/07/19   Earlie Server, MD  hydrALAZINE (APRESOLINE) 100 MG tablet Take 50 mg by mouth 2 (two)  times daily.     [provider]  hydrochlorothiazide (HYDRODIURIL) 12.5 MG tablet Take 12.5 mg by mouth daily.  09/27/17   [provider]  ibuprofen (ADVIL) 600 MG tablet Take 600 mg by mouth every 6 (six) hours. 06/23/20   [provider]  KLOR-CON M20 20 MEQ tablet Take 2 tablets (40 mEq total) by mouth daily. 08/31/20   Earlie Server, MD  latanoprost (XALATAN) 0.005 % ophthalmic solution 1 drop at bedtime. 09/23/19   [provider]  lidocaine-prilocaine (EMLA) cream Apply to affected area once 01/04/19   Earlie Server, MD  loratadine (CLARITIN) 10 MG tablet Take 10 mg by mouth  daily.    [provider]  magnesium chloride (SLOW-MAG) 64 MG TBEC SR tablet Take 1 tablet (64 mg total) by mouth 2 (two) times daily. TAKE 1 TABLET (64 MG TOTAL) BY MOUTH DAILY. 08/10/20   Earlie Server, MD  methocarbamol (ROBAXIN) 500 MG tablet Take 1 tablet (500 mg total) by mouth at bedtime. 05/02/19   Earlie Server, MD  nystatin (MYCOSTATIN) 100000 UNIT/ML suspension Take 5 mLs (500,000 Units total) by mouth 4 (four) times daily. Swish and spit. 09/21/20   Earlie Server, MD  olmesartan (BENICAR) 40 MG tablet Take 40 mg by mouth every other day.  08/28/17   [provider]  ondansetron (ZOFRAN) 8 MG tablet Take 1 tablet (8 mg total) by mouth every 8 (eight) hours as needed for refractory nausea / vomiting. Start on day 3 after chemo. 08/10/20   Earlie Server, MD  prochlorperazine (COMPAZINE) 10 MG tablet Take 1 tablet (10 mg total) by mouth every 6 (six) hours as needed (Nausea or vomiting). 08/10/20   Earlie Server, MD  SPIRIVA HANDIHALER 18 MCG inhalation capsule INHALE 1 CAPSULE VIA HANDIHALER ONCE DAILY AT THE SAME TIME EVERY DAY 04/08/20   Earlie Server, MD  SYMBICORT 80-4.5 MCG/ACT inhaler INHALE 2 PUFFS INTO THE LUNGS IN THE MORNING AND AT BEDTIME. 05/15/20   Earlie Server, MD  vitamin B-12 (CYANOCOBALAMIN) 1000 MCG tablet Take 1 tablet (1,000 mcg total) by mouth daily. 08/04/19   Earlie Server, MD     Objective    Physical Exam: Vitals:   12/30/20 2009  BP: (!) 145/98  Pulse: 96  Resp: 20  Temp: 98.5 F (36.9 C)  TempSrc: Oral  SpO2: 97%  Weight: 64 kg  Height: _0  (1.854 m)    General: appears to be stated age; alert, oriented Skin: warm, dry Head:  AT/[place] Mouth:  Oral mucosa membranes appear moist, normal dentition Neck: supple; trachea midline Heart:  RRR; did not appreciate any M/R/G Chest: left-sided port-a-cath noted. Lungs: CTAB, did not appreciate any wheezes, rales, or rhonchi Abdomen: + BS; soft, ND, NT Vascular: 2+ pedal pulses b/l; 2+ radial pulses b/l Extremities: non-pitting edema  of the lle noted; no muscle wasting Neuro: strength and sensation intact in upper and lower extremities b/l    Labs on Admission: I have personally reviewed following labs and imaging studies  CBC: Recent Labs  Lab 12/30/20 1030  WBC 10.8*  NEUTROABS 8.5*  HGB 16.2  HCT 45.2  MCV 107.4*  PLT 676*   Basic Metabolic Panel: No results for input(s): NA, K, CL, CO2, GLUCOSE, BUN, CREATININE, CALCIUM, MG, PHOS in the last 168 hours. GFR: Estimated Creatinine Clearance: 74.8 mL/min (by C-G formula based on SCr of 1.01 mg/dL). Liver Function Tests: No results for input(s): AST, ALT, ALKPHOS, BILITOT, PROT, ALBUMIN in the last 168 hours. No results  for input(s): LIPASE, AMYLASE in the last 168 hours. No results for input(s): AMMONIA in the last 168 hours. Coagulation Profile: No results for input(s): INR, PROTIME in the last 168 hours. Cardiac Enzymes: No results for input(s): CKTOTAL, CKMB, CKMBINDEX, TROPONINI in the last 168 hours. BNP (last 3 results) No results for input(s): PROBNP in the last 8760 hours. HbA1C: No results for input(s): HGBA1C in the last 72 hours. CBG: No results for input(s): GLUCAP in the last 168 hours. Lipid Profile: No results for input(s): CHOL, HDL, LDLCALC, TRIG, CHOLHDL, LDLDIRECT in the last 72 hours. Thyroid Function Tests: No results for input(s): TSH, T4TOTAL, FREET4, T3FREE, THYROIDAB in the last 72 hours. Anemia Panel: No results for input(s): VITAMINB12, FOLATE, FERRITIN, TIBC, IRON, RETICCTPCT in the last 72 hours. Urine analysis:    Component Value Date/Time   COLORURINE Yellow 11/02/2014 1900   APPEARANCEUR Hazy 11/02/2014 1900   LABSPEC 1.013 11/02/2014 1900   PHURINE 9.0 11/02/2014 1900   GLUCOSEU Negative 11/02/2014 1900   HGBUR 1+ 11/02/2014 1900   BILIRUBINUR Negative 11/02/2014 1900   KETONESUR Negative 11/02/2014 1900   PROTEINUR 30 mg/dL 11/02/2014 1900   NITRITE Negative 11/02/2014 1900   LEUKOCYTESUR 3+ 11/02/2014 1900     Radiological Exams on Admission: US Venous Img Lower Unilateral Left  Result Date: 12/30/2020 CLINICAL DATA:  Left leg swelling EXAM: LEFT LOWER EXTREMITY VENOUS DOPPLER ULTRASOUND TECHNIQUE: Gray-scale sonography with graded compression, as well as color Doppler and duplex ultrasound were performed to evaluate the lower extremity deep venous systems from the level of the common femoral vein and including the common femoral, femoral, profunda femoral, popliteal and calf veins including the posterior tibial, peroneal and gastrocnemius veins when visible. The superficial great saphenous vein was also interrogated. Spectral Doppler was utilized to evaluate flow at rest and with distal augmentation maneuvers in the common femoral, femoral and popliteal veins. COMPARISON:  None. FINDINGS: Contralateral Common Femoral Vein: Respiratory phasicity is normal and symmetric with the symptomatic side. No evidence of thrombus. Normal compressibility. Common Femoral Vein: Thrombus is noted with decreased compressibility. The thrombus is occlusive in nature. Saphenofemoral Junction: No evidence of thrombus. Normal compressibility and flow on color Doppler imaging. Profunda Femoral Vein: Thrombus is noted with decreased compressibility. Femoral Vein: Thrombus is noted with decreased compressibility. Popliteal Vein: Thrombus is noted with decreased compressibility. Calf Veins: Thrombus is noted with decreased compressibility. Superficial Great Saphenous Vein: No evidence of thrombus. Normal compressibility. Venous Reflux:  None. Other Findings:  None. IMPRESSION: Extensive deep venous thrombosis is noted from the calf veins to the common femoral vein on the left. Electronically Signed   By: Inez Catalina M.D.   On: 12/30/2020 21:20     EKG: Independently reviewed, with result as described above.    Assessment/Plan   55 y.o. 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    Principal Problem:   Acute deep vein thrombosis (DVT) (HCC) Active Problems:   Benign essential hypertension   Squamous cell carcinoma of lung, right (HCC)   Emphysema of lung (HCC)   Allergic rhinitis     #) Acute left lower extremity DVT: In the setting of presenting 2 weeks of onset left lower extremity swelling with preceding crampy calf discomfort, venous  ultrasound of the left lower extremity performed this evening demonstrated evidence of extensive, occlusive DVT involving the left lower extremity, as further detailed above.  Appears provoked in the setting of patient's history of underlying malignancy in the form of stage IV squamous cell carcinoma the right lung for which she is receiving chemotherapy.  No other overt provoking factors identified at this time.  In terms of further risk factor/provoking factor evaluation, will follow for result of screening COVID-19 PCR performed in the ED this evening, as COVID-19 is associated with a prothrombotic state.   Case and imaging, including the above LLE venous US findings, were discussed with the on-call vascular surgeon, Dr. Delana Meyer, who recommended admission to the hospitalist service for further evaluation and management of acute DVT, including initiation of heparin drip, with plan for Dr. Lucky Cowboy of vascular surgery to evaluate the patient in the morning for potential thrombectomy.  Inpatient pharmacist consulted, with ensuing initiation of heparin bolus followed by continuous heparin drip, as above.  We will ultimately require transition to oral anticoagulant prior to discharge.  Not on any blood thinners at home leading up to this evening's presentation, including no aspirin.  No clinical evidence to  suggest acute pulmonary embolism at this time, including no acute respiratory symptoms and no recent chest discomfort.  Additionally, patient does not appear to be in any acute respiratory distress, is maintaining his oxygen saturations in the high 90s on room air, and demonstrates no evidence of hypotension.  Overall appears hemodynamically stable and asymptomatic with the exception of his presenting left lower extremity swelling. Will check EKG to establish baseline.LLE appears neurovascularly intact at this time.    Plan: Inpatient pharmacy consult for assistance with ongoing management of heparin drip, which was initiated in the ED this evening, as above.  Repeat CBC in the morning.  Monitor on telemetry.  Establish baseline EKG, as above.  As needed Norco.  Vascular surgery consulted, with potential thrombectomy tomorrow (12/31/20).  Follow-up results of COVID-19 screen, as above.      #) Hypomagnesemia: Presenting serum magnesium level found to be mildly low at 1.6.  Plan: Magnesium sulfate 2 g IV over 2 hours x 1 dose now.  Repeat serum magnesium level in the morning.      #) COPD: Documented history of such in the context of being a former smoker, having completely quit smoking in 2020.  Outpatient respiratory regimen consists of Symbicort, Spiriva, and as needed albuterol inhaler.  No evidence to suggest acute COPD exacerbation at this time.  Plan: Resume home Symbicort, Spiriva, and as needed albuterol inhaler.  Follow-up results of nasopharyngeal COVID-19 PCR screen, which was ordered in the ED this evening, with result currently pending.       #) Essential hypertension: Outpatient antihypertensive regimen includes olmesartan, oral clonidine, diltiazem, hydralazine, and HCTZ.  Presenting systolic blood pressures noted to be in the 140s, without any associated evidence of hypotension, which is notable given presenting extensive acute DVT of the left lower extremity. Given  increased risk for development of acute pulmonary embolism given this presentation, will resume only a portion of home antihypertensive regimen for now, with close monitoring of ensuing blood pressure for development of evidence of hypotension.  Additionally, given over can be an element of preload dependence associated with a large acute pulmonary embolism, will hold home diuretic in the form of HCTZ for now.   Plan: Hold home HCTZ, clonidine, diltiazem for now.  We will resume home losartan and hydralazine, with  close monitoring of ensuing blood pressure via routine vital signs.        #) Allergic rhinitis: On scheduled loratadine as an outpatient.  Of note, patient does not appear to be on any intranasal corticosteroid as an outpatient.   Plan: Continue home antihistamine.  Could consider initiation of intranasal corticosteroid as an outpatient, as this represents first-line intervention for allergic rhinitis.        #) Stage IV squamous cell carcinoma of the right lung: Documented history of such, which appears to been diagnosed in December 2019.  Follows with Dr. His outpatient oncologist.  Undergoing chemotherapy, with patient reporting most recent chemotherapy received 3 weeks ago.  Of note, patient has a left-sided Port-A-Cath, which is reportedly not been functioning appropriately recently.  As a consequence, outpatient IR consultation was recently placed, with the patient scheduled for fluoroscopy guided intervention on 01/06/2021.  Plan: Outpatient management per outpatient oncology.      DVT prophylaxis: Heparin drip Code Status: Full code Family Communication: none Disposition Plan: Per Rounding Team Consults called: vascular surgery consulted, as further detailed above  Admission status: Inpatient; med telemetry     Of note, this patient was added by me to the following Admit List/Treatment Team: armcadmits.      PLEASE NOTE THAT DRAGON DICTATION SOFTWARE WAS  USED IN THE CONSTRUCTION OF THIS NOTE.   [place] [facility] Pager [phone] From [place]  Otherwise, please contact night-coverage  [email or web address] Password [facility]   12/30/2020, 10:57 PM

## 2020-12-30 NOTE — Progress Notes (Signed)
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  Vascular surgery has been consulted, with recommendation for initiation of heparin drip, with anticipation that Dr. Lucky Cowboy will evaluation patient in the morning for potential thrombectomy.  Patient appears hemodynamically stable, and presentation is not associate with any recent acute respiratory symptoms or any chest discomfort.  Pharmacy has been consulted for initiation of heparin drip with preceding heparin bolus.  As needed Norco for left lower extremity discomfort.      Babs Bertin, DO Hospitalist

## 2020-12-30 NOTE — ED Provider Notes (Signed)
[facility] Emergency Department Provider Note  ____________________________________________   Event Date/Time   First MD Initiated Contact with Patient 12/30/20 2013     (approximate)  I have reviewed the triage vital signs and the nursing notes.   HISTORY  Chief Complaint Leg Swelling   HPI Samuel Choi is a 55 y.o. 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  Patient describes onset initially was posterior calf pain that he described as a cramp.  He subsequently placed the TED hose on that he had from a previous knee fracture on the right some 3 years prior.  From that point, the pain ascended to the patient's knee region, and now he describes pain to the medial thigh into the groin.  He presents with what he describes as swelling but denies any frank pain at this time the left lower extremity.  He denies any shortness of breath, chest pain, or weakness.  Patient has not notified his cancer center provider or his PCP related to the left leg pain.    Past Medical History:  Diagnosis Date   Alcohol abuse    usually drinks 2-3 drinks per day   Atherosclerosis 06/2018   Chronic sinusitis    Dehydration 02/07/2019   Emphysema of lung ([place]) 06/2018   patient unaware of this.   Hip fracture ([place]) 05/2018   no surgery   History of kidney stones 05/2018   per xray, bilateral nephrolitiasis   Hypertension    Squamous cell carcinoma of lung, right ([place]) 06/2018    Patient Active Problem List   Diagnosis Date Noted   Chemotherapy induced neutropenia ([place]) 11/09/2020   COPD exacerbation ([place]) 04/22/2020   Cough 03/31/2020   Sinus tachycardia 12/18/2019   Hypomagnesemia 12/10/2019   Encounter for antineoplastic immunotherapy 11/24/2019   Ex-smoker 07/23/2019   Palpitations 06/13/2019   Port-A-Cath in place 04/04/2019   Thrombocytopenia ([place])  04/04/2019   Folate deficiency 04/04/2019   Hypokalemia 04/04/2019   B12 deficiency 04/04/2019   Alcohol abuse 04/04/2019   Dehydration 02/07/2019   Encounter for antineoplastic chemotherapy 01/31/2019   Squamous cell carcinoma of lung, right ([place]) 09/03/2018   Goals of care, counseling/discussion 09/03/2018   Malnutrition of moderate degree 08/23/2018   Lung mass 08/13/2018   Adenopathy    H/O nasal polyp 02/20/2018   Acute sinusitis 10/10/2017   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/10/2017   Chronic rhinitis 10/10/2017   Mass of sinus 10/10/2017   Benign essential hypertension 09/27/2017   Tobacco abuse 09/27/2017    Past Surgical History:  Procedure Laterality Date   BRAIN SURGERY  10/2017   nasal/sinus endoscopy. mass benign   ELECTROMAGNETIC NAVIGATION BROCHOSCOPY Right 06/25/2018   Procedure: ELECTROMAGNETIC NAVIGATION BRONCHOSCOPY;  Surgeon: [name], MD;  Location: [facility] ORS;  Service: Cardiopulmonary;  Laterality: Right;   ENDOBRONCHIAL ULTRASOUND Right 06/25/2018   Procedure: ENDOBRONCHIAL ULTRASOUND;  Surgeon: [name], MD;  Location: [facility] ORS;  Service: Cardiopulmonary;  Laterality: Right;   ENDOBRONCHIAL ULTRASOUND Right 12/26/2018   Procedure: ENDOBRONCHIAL ULTRASOUND RIGHT;  Surgeon: [name], MD;  Location: [facility] ORS;  Service: Cardiopulmonary;  Laterality: Right;   FLEXIBLE BRONCHOSCOPY N/A 08/20/2018   Procedure: FLEXIBLE BRONCHOSCOPY PREOP;  Surgeon: [name], MD;  Location: [facility] ORS;  Service: Thoracic;  Laterality: N/A;   IR CV LINE INJECTION  04/04/2019   NASAL SINUS SURGERY  10/2017  At [facility], frontal sinusotomy, ethmoidectomy, resection anterior cranial fossa neoplasm, turbinate resection   PORTACATH PLACEMENT Left 01/15/2019   Procedure: INSERTION PORT-A-CATH;  Surgeon: [name], MD;  Location: [facility] ORS;  Service: General;  Laterality: Left;   THORACOTOMY Right 08/13/2018   Procedure: PREOP BROCHOSCOPY WITH RIGHT THORACOTOMY AND RUL RESECTION;  Surgeon:  [name], MD;  Location: [facility] ORS;  Service: General;  Laterality: Right;   THORACOTOMY Right 08/20/2018   Procedure: THORACOTOMY MAJOR RIGHT UPPER LOBE LOBECTOMY;  Surgeon: [name], MD;  Location: [facility] OR[facility];  Service: Thoracic;  Laterality: Right;   TOE SURGERY Left    pin in left toe    Prior to Admission medications   Medication Sig Start Date End Date Taking? Authorizing Provider  albuterol (VENTOLIN HFA) 108 (90 Base) MCG/ACT inhaler TAKE 2 PUFFS BY MOUTH EVERY 6 HOURS AS NEEDED FOR WHEEZE OR SHORTNESS OF BREATH 11/08/20   Earlie Server, MD  cloNIDine (CATAPRES) 0.1 MG tablet Take 0.1 mg by mouth daily.  05/02/18   [provider]  diltiazem (CARDIZEM CD) 120 MG 24 hr capsule Take 120 mg by mouth daily. 06/19/19   [provider]  folic acid (V-R FOLIC ACID) 254 MCG tablet Take 1 tablet (400 mcg total) by mouth daily. 02/07/19   Earlie Server, MD  hydrALAZINE (APRESOLINE) 100 MG tablet Take 50 mg by mouth 2 (two) times daily.     [provider]  hydrochlorothiazide (HYDRODIURIL) 12.5 MG tablet Take 12.5 mg by mouth daily.  09/27/17   [provider]  ibuprofen (ADVIL) 600 MG tablet Take 600 mg by mouth every 6 (six) hours. 06/23/20   [provider]  KLOR-CON M20 20 MEQ tablet Take 2 tablets (40 mEq total) by mouth daily. 08/31/20   Earlie Server, MD  latanoprost (XALATAN) 0.005 % ophthalmic solution 1 drop at bedtime. 09/23/19   [provider]  lidocaine-prilocaine (EMLA) cream Apply to affected area once 01/04/19   Earlie Server, MD  loratadine (CLARITIN) 10 MG tablet Take 10 mg by mouth daily.    [provider]  magnesium chloride (SLOW-MAG) 64 MG TBEC SR tablet Take 1 tablet (64 mg total) by mouth 2 (two) times daily. TAKE 1 TABLET (64 MG TOTAL) BY MOUTH DAILY. 08/10/20   Earlie Server, MD  methocarbamol (ROBAXIN) 500 MG tablet Take 1 tablet (500 mg total) by mouth at bedtime. 05/02/19   Earlie Server, MD  nystatin (MYCOSTATIN) 100000 UNIT/ML  suspension Take 5 mLs (500,000 Units total) by mouth 4 (four) times daily. Swish and spit. 09/21/20   Earlie Server, MD  olmesartan (BENICAR) 40 MG tablet Take 40 mg by mouth every other day.  08/28/17   [provider]  ondansetron (ZOFRAN) 8 MG tablet Take 1 tablet (8 mg total) by mouth every 8 (eight) hours as needed for refractory nausea / vomiting. Start on day 3 after chemo. 08/10/20   Earlie Server, MD  prochlorperazine (COMPAZINE) 10 MG tablet Take 1 tablet (10 mg total) by mouth every 6 (six) hours as needed (Nausea or vomiting). 08/10/20   Earlie Server, MD  SPIRIVA HANDIHALER 18 MCG inhalation capsule INHALE 1 CAPSULE VIA HANDIHALER ONCE DAILY AT THE SAME TIME EVERY DAY 04/08/20   Earlie Server, MD  SYMBICORT 80-4.5 MCG/ACT inhaler INHALE 2 PUFFS INTO THE LUNGS IN THE MORNING AND AT BEDTIME. 05/15/20   Earlie Server, MD  vitamin B-12 (CYANOCOBALAMIN) 1000 MCG tablet Take 1 tablet (1,000 mcg total) by mouth daily. 08/04/19   Earlie Server,  MD    Allergies Patient has no known allergies.  Family History  Problem Relation Age of Onset   Breast cancer Mother    Diabetes Mother    Lung cancer Father    Hypertension Father     Social History Social History   Tobacco Use   Smoking status: Former    Packs/day: 0.50    Years: 15.00    Pack years: 7.50    Types: Cigarettes    Quit date: 08/2018    Years since quitting: 2.3   Smokeless tobacco: Never  Vaping Use   Vaping Use: Never used  Substance Use Topics   Alcohol use: Yes    Alcohol/week: 3.0 standard drinks    Types: 3 Cans of beer per week    Comment: usually 2 drinks per week, per patient   Drug use: No    Review of Systems  Constitutional: No fever/chills Eyes: No visual changes. ENT: No sore throat. Cardiovascular: Denies chest pain. LLE edema as above Respiratory: Denies shortness of breath. Gastrointestinal: No abdominal pain.  No nausea, no vomiting.  No diarrhea.  No constipation. Genitourinary: Negative for  dysuria. Musculoskeletal: Negative for back pain. Skin: Negative for rash. Neurological: Negative for headaches, focal weakness or numbness. ____________________________________________   PHYSICAL EXAM:  VITAL SIGNS: ED Triage Vitals [12/30/20 2009]  Enc Vitals Group     BP (!) 145/98     Pulse Rate 96     Resp 20     Temp 98.5 F (36.9 C)     Temp Source Oral     SpO2 97 %     Weight 141 lb (64 kg)     Height _0  (1.854 m)     Head Circumference      Peak Flow      Pain Score 8     Pain Loc      Pain Edu?      Excl. in [place]?     Constitutional: Alert and oriented. Well appearing and in no acute distress. Eyes: Conjunctivae are normal. PERRL. EOMI. Head: Atraumatic. Neck: No stridor.   Cardiovascular: Normal rate, regular rhythm. Grossly normal heart sounds.  Good peripheral circulation. Respiratory: Normal respiratory effort.  No retractions. Lungs CTAB. Gastrointestinal: Soft and nontender. No distention. No abdominal bruits. No CVA tenderness. Musculoskeletal: No lower extremity tenderness. But subtle edema from the thigh to the ankle when compared to the unaffected RLR  No joint effusions. Neurologic:  Normal speech and language. No gross focal neurologic deficits are appreciated. No gait instability. Skin:  Skin is warm, dry and intact. No rash noted. Psychiatric: Mood and affect are normal. Speech and behavior are normal.  ____________________________________________   LABS (all labs ordered are listed, but only abnormal results are displayed)  Labs Reviewed  CBC WITH DIFFERENTIAL/PLATELET - Abnormal; Notable for the following components:      Result Value   WBC 10.8 (*)    RBC 4.21 (*)    MCV 107.4 (*)    MCH 38.5 (*)    Platelets 146 (*)    Neutro Abs 8.5 (*)    All other components within normal limits  RESP PANEL BY RT-PCR (FLU A&B, COVID) ARPGX2  BASIC METABOLIC PANEL  APTT  PROTIME-INR  CBC    ____________________________________________  EKG  ____________________________________________  RADIOLOGY I, Melvenia Needles, personally viewed and evaluated these images (plain radiographs) as part of my medical decision making, as well as reviewing the written report by the radiologist.  ED MD interpretation:  agree with report  Official radiology report(s): US Venous Img Lower Unilateral Left  Result Date: 12/30/2020 CLINICAL DATA:  Left leg swelling EXAM: LEFT LOWER EXTREMITY VENOUS DOPPLER ULTRASOUND TECHNIQUE: Gray-scale sonography with graded compression, as well as color Doppler and duplex ultrasound were performed to evaluate the lower extremity deep venous systems from the level of the common femoral vein and including the common femoral, femoral, profunda femoral, popliteal and calf veins including the posterior tibial, peroneal and gastrocnemius veins when visible. The superficial great saphenous vein was also interrogated. Spectral Doppler was utilized to evaluate flow at rest and with distal augmentation maneuvers in the common femoral, femoral and popliteal veins. COMPARISON:  None. FINDINGS: Contralateral Common Femoral Vein: Respiratory phasicity is normal and symmetric with the symptomatic side. No evidence of thrombus. Normal compressibility. Common Femoral Vein: Thrombus is noted with decreased compressibility. The thrombus is occlusive in nature. Saphenofemoral Junction: No evidence of thrombus. Normal compressibility and flow on color Doppler imaging. Profunda Femoral Vein: Thrombus is noted with decreased compressibility. Femoral Vein: Thrombus is noted with decreased compressibility. Popliteal Vein: Thrombus is noted with decreased compressibility. Calf Veins: Thrombus is noted with decreased compressibility. Superficial Great Saphenous Vein: No evidence of thrombus. Normal compressibility. Venous Reflux:  None. Other Findings:  None. IMPRESSION: Extensive deep  venous thrombosis is noted from the calf veins to the common femoral vein on the left. Electronically Signed   By: Inez Catalina M.D.   On: 12/30/2020 21:20    ____________________________________________   PROCEDURES  Procedure(s) performed (including Critical Care):  Procedures  Heparin per pharmacy consult for DVT Tx ____________________________________________   INITIAL IMPRESSION / ASSESSMENT AND PLAN / ED COURSE  As part of my medical decision making, I reviewed the following data within the [place] reviewed as above, A consult was requested and obtained from this/these consultant(s) Dr. Delana Meyer and Dr. Velia Meyer will admit to the hospitalist service, and Notes from prior ED visits    DDX: DVT, peripheral edema, cellulitis  Patient with history of lung cancer, currently on chemo therapy treatment, presents with 2 weeks of left leg swelling.  Patient was evaluated for his complaint, and found to have an extensive occlusive DVT from the common femoral vein to the calf veins on the left.  ----------------------------------------- 10:04 PM on 12/30/2020 ----------------------------------------- S/W Dr. Delana Meyer: he suggests admit with heparin for anticoagulation and vascular consult for thrombectomy.   Patient is aware of the plan of care and is amenable to admission and surgical intervention. ____________________________________________   FINAL CLINICAL IMPRESSION(S) / ED DIAGNOSES  Final diagnoses:  Left leg swelling  Acute deep vein thrombosis (DVT) of left lower extremity, unspecified vein [facility])     ED Discharge Orders     None        Note:  This document was prepared using Dragon voice recognition software and may include unintentional dictation errors.    Melvenia Needles, PA-C 12/30/20 2251    Naaman Plummer, MD 01/03/21 [phone]

## 2020-12-31 ENCOUNTER — Other Ambulatory Visit: Payer: Self-pay

## 2020-12-31 ENCOUNTER — Encounter
Admission: EM | Disposition: A | Discharge status: Home / Self Care | Payer: Self-pay | Source: Home / Self Care | Attending: Internal Medicine

## 2020-12-31 ENCOUNTER — Other Ambulatory Visit (INDEPENDENT_AMBULATORY_CARE_PROVIDER_SITE_OTHER): Payer: Self-pay | Admitting: Vascular Surgery

## 2020-12-31 DIAGNOSIS — I10 Essential (primary) hypertension: Secondary | ICD-10-CM

## 2020-12-31 DIAGNOSIS — I82402 Acute embolism and thrombosis of unspecified deep veins of left lower extremity: Secondary | ICD-10-CM

## 2020-12-31 DIAGNOSIS — I871 Compression of vein: Secondary | ICD-10-CM

## 2020-12-31 DIAGNOSIS — I82412 Acute embolism and thrombosis of left femoral vein: Secondary | ICD-10-CM

## 2020-12-31 DIAGNOSIS — C3491 Malignant neoplasm of unspecified part of right bronchus or lung: Secondary | ICD-10-CM

## 2020-12-31 DIAGNOSIS — R600 Localized edema: Secondary | ICD-10-CM

## 2020-12-31 DIAGNOSIS — I82432 Acute embolism and thrombosis of left popliteal vein: Secondary | ICD-10-CM

## 2020-12-31 DIAGNOSIS — I82422 Acute embolism and thrombosis of left iliac vein: Secondary | ICD-10-CM

## 2020-12-31 HISTORY — PX: PERIPHERAL VASCULAR THROMBECTOMY: CATH118306

## 2020-12-31 LAB — CBC
HCT: 40.4 % (ref 39.0–52.0)
Hemoglobin: 14.7 g/dL (ref 13.0–17.0)
MCH: 38.7 pg — ABNORMAL HIGH (ref 26.0–34.0)
MCHC: 36.4 g/dL — ABNORMAL HIGH (ref 30.0–36.0)
MCV: 106.3 fL — ABNORMAL HIGH (ref 80.0–100.0)
Platelets: 142 10*3/uL — ABNORMAL LOW (ref 150–400)
RBC: 3.8 MIL/uL — ABNORMAL LOW (ref 4.22–5.81)
RDW: 11.9 % (ref 11.5–15.5)
WBC: 10.3 10*3/uL (ref 4.0–10.5)
nRBC: 0 % (ref 0.0–0.2)

## 2020-12-31 LAB — COMPREHENSIVE METABOLIC PANEL
ALT: 16 U/L (ref 0–44)
AST: 21 U/L (ref 15–41)
Albumin: 3.5 g/dL (ref 3.5–5.0)
Alkaline Phosphatase: 91 U/L (ref 38–126)
Anion gap: 11 (ref 5–15)
BUN: 12 mg/dL (ref 6–20)
CO2: 24 mmol/L (ref 22–32)
Calcium: 9.2 mg/dL (ref 8.9–10.3)
Chloride: 102 mmol/L (ref 98–111)
Creatinine, Ser: 0.91 mg/dL (ref 0.61–1.24)
GFR, Estimated: >60 mL/min (ref >60)
Glucose, Bld: 100 mg/dL — ABNORMAL HIGH (ref 70–99)
Potassium: 4.1 mmol/L (ref 3.5–5.1)
Sodium: 137 mmol/L (ref 135–145)
Total Bilirubin: 0.9 mg/dL (ref 0.3–1.2)
Total Protein: 7.7 g/dL (ref 6.5–8.1)

## 2020-12-31 LAB — HIV ANTIBODY (ROUTINE TESTING W REFLEX): HIV Screen 4th Generation wRfx: NONREACTIVE

## 2020-12-31 LAB — HEPARIN LEVEL (UNFRACTIONATED)
Heparin Unfractionated: 0.44 IU/mL (ref 0.30–0.70)
Heparin Unfractionated: 0.44 IU/mL (ref 0.30–0.70)
Heparin Unfractionated: 0.26 IU/mL — ABNORMAL LOW (ref 0.30–0.70)

## 2020-12-31 LAB — MAGNESIUM: Magnesium: 2.2 mg/dL (ref 1.7–2.4)

## 2020-12-31 LAB — PHOSPHORUS: Phosphorus: 3.2 mg/dL (ref 2.5–4.6)

## 2020-12-31 SURGERY — PERIPHERAL VASCULAR THROMBECTOMY
Anesthesia: Moderate Sedation | Laterality: Left

## 2020-12-31 MED ORDER — CEFAZOLIN SODIUM-DEXTROSE 2-4 GM/100ML-% IV SOLN: 2.0000 g | Freq: Once | INTRAVENOUS | Status: AC

## 2020-12-31 MED ORDER — FAMOTIDINE 20 MG PO TABS: 40.0000 mg | ORAL_TABLET | Freq: Once | ORAL | Status: DC | PRN

## 2020-12-31 MED ORDER — FENTANYL CITRATE (PF) 100 MCG/2ML IJ SOLN
INTRAMUSCULAR | Status: AC
  Filled 2020-12-31: qty 2

## 2020-12-31 MED ORDER — FENTANYL CITRATE (PF) 100 MCG/2ML IJ SOLN
INTRAMUSCULAR | Status: DC | PRN
  Administered 2020-12-31: 25 ug via INTRAVENOUS
  Administered 2020-12-31: 50 ug via INTRAVENOUS
  Administered 2020-12-31: 25 ug via INTRAVENOUS

## 2020-12-31 MED ORDER — MIDAZOLAM HCL 5 MG/5ML IJ SOLN
INTRAMUSCULAR | Status: AC
  Filled 2020-12-31: qty 5

## 2020-12-31 MED ORDER — MAGNESIUM SULFATE 2 GM/50ML IV SOLN
2.0000 g | Freq: Once | INTRAVENOUS | Status: AC
  Administered 2020-12-31: 2 g via INTRAVENOUS
  Filled 2020-12-31: qty 50

## 2020-12-31 MED ORDER — DIPHENHYDRAMINE HCL 50 MG/ML IJ SOLN: 50.0000 mg | Freq: Once | INTRAMUSCULAR | Status: DC | PRN

## 2020-12-31 MED ORDER — HEPARIN SODIUM (PORCINE) 1000 UNIT/ML IJ SOLN
INTRAMUSCULAR | Status: AC
  Filled 2020-12-31: qty 1

## 2020-12-31 MED ORDER — MIDAZOLAM HCL 2 MG/2ML IJ SOLN
INTRAMUSCULAR | Status: DC | PRN
Start: 2020-12-31 — End: 2020-12-31
  Administered 2020-12-31 (×2): 2 mg via INTRAVENOUS
  Administered 2020-12-31: 1 mg via INTRAVENOUS

## 2020-12-31 MED ORDER — DILTIAZEM HCL ER COATED BEADS 120 MG PO CP24
120.0000 mg | ORAL_CAPSULE | Freq: Every day | ORAL | Status: DC
  Administered 2020-12-31 – 2021-01-02 (×3): 120 mg via ORAL
  Filled 2020-12-31 (×3): qty 1

## 2020-12-31 MED ORDER — ENSURE ENLIVE PO LIQD
237.0000 mL | Freq: Three times a day (TID) | ORAL | Status: DC
  Administered 2021-01-01 – 2021-01-02 (×4): 237 mL via ORAL

## 2020-12-31 MED ORDER — ALTEPLASE 2 MG IJ SOLR
INTRAMUSCULAR | Status: AC
  Filled 2020-12-31: qty 6

## 2020-12-31 MED ORDER — CEFAZOLIN SODIUM-DEXTROSE 2-4 GM/100ML-% IV SOLN
INTRAVENOUS | Status: AC
  Administered 2020-12-31: 2 g via INTRAVENOUS
  Filled 2020-12-31: qty 100

## 2020-12-31 MED ORDER — SODIUM CHLORIDE 0.9 % IV SOLN: INTRAVENOUS | Status: DC

## 2020-12-31 MED ORDER — METHYLPREDNISOLONE SODIUM SUCC 125 MG IJ SOLR: 125.0000 mg | Freq: Once | INTRAMUSCULAR | Status: DC | PRN

## 2020-12-31 MED ORDER — MIDAZOLAM HCL 2 MG/ML PO SYRP: 8.0000 mg | ORAL_SOLUTION | Freq: Once | ORAL | Status: DC | PRN

## 2020-12-31 MED ORDER — HYDROMORPHONE HCL 1 MG/ML IJ SOLN: 1.0000 mg | Freq: Once | INTRAMUSCULAR | Status: DC | PRN

## 2020-12-31 MED ORDER — ONDANSETRON HCL 4 MG/2ML IJ SOLN: 4.0000 mg | Freq: Four times a day (QID) | INTRAMUSCULAR | Status: DC | PRN

## 2020-12-31 MED ORDER — IODIXANOL 320 MG/ML IV SOLN
INTRAVENOUS | Status: DC | PRN
  Administered 2020-12-31: 35 mL via INTRAVENOUS

## 2020-12-31 MED ORDER — HEPARIN BOLUS VIA INFUSION
950.0000 [IU] | Freq: Once | INTRAVENOUS | Status: AC
  Administered 2020-12-31: 950 [IU] via INTRAVENOUS
  Filled 2020-12-31: qty 950

## 2020-12-31 MED ORDER — METOPROLOL SUCCINATE ER 25 MG PO TB24
25.0000 mg | ORAL_TABLET | Freq: Every day | ORAL | Status: DC
  Administered 2020-12-31 – 2021-01-02 (×3): 25 mg via ORAL
  Filled 2020-12-31 (×3): qty 1

## 2020-12-31 SURGICAL SUPPLY — 14 items
BALLN ATG 14X4X80 (BALLOONS) ×2
BALLOON ATG 14X4X80 (BALLOONS) ×1 IMPLANT
CANISTER PENUMBRA ENGINE (MISCELLANEOUS) ×2 IMPLANT
CANNULA 5F STIFF (CANNULA) ×2 IMPLANT
CATH INDIGO 12XTORQ 100 (CATHETERS) ×2 IMPLANT
CATH INDIGO SEP 12 (CATHETERS) ×2 IMPLANT
CATH KUMPE SOFT-VU 5FR 65 (CATHETERS) ×2 IMPLANT
COVER PROBE U/S 5X48 (MISCELLANEOUS) ×2 IMPLANT
KIT ENCORE 26 ADVANTAGE (KITS) ×2 IMPLANT
PACK ANGIOGRAPHY (CUSTOM PROCEDURE TRAY) ×2 IMPLANT
SHEATH BRITE TIP 8FRX11 (SHEATH) ×2 IMPLANT
SHEATH PINNACLE 11FRX10 (SHEATH) ×2 IMPLANT
SUT PROLENE 0 CT 1 30 (SUTURE) ×2 IMPLANT
WIRE MAGIC TOR.035 180C (WIRE) ×2 IMPLANT

## 2020-12-31 NOTE — Progress Notes (Signed)
ANTICOAGULATION CONSULT NOTE - Initial Consult  Pharmacy Consult for Heparin  Indication: DVT  No Known Allergies  Patient Measurements: Height: 6\' 1"  (185.4 cm) Weight: 62.6 kg (138 lb) IBW/kg (Calculated) : 79.9 Heparin Dosing Weight: 64 kg   Vital Signs: Temp: 98.3 F (36.8 C) (06/24 2043) Temp Source: Oral (06/24 1307) BP: 116/79 (06/24 2043) Pulse Rate: 103 (06/24 2043)  Labs: Recent Labs    12/30/20 1030 12/30/20 2220 12/31/20 0514 12/31/20 1047 12/31/20 2211  HGB 16.2  --  14.7  --   --   HCT 45.2  --  40.4  --   --   PLT 146*  --  142*  --   --   APTT  --  32  --   --   --   LABPROT  --  12.8  --   --   --   INR  --  1.0  --   --   --   HEPARINUNFRC  --   --  0.44 0.44 0.26*  CREATININE 0.97  --  0.91  --   --      Estimated Creatinine Clearance: 81.2 mL/min (by C-G formula based on SCr of 0.91 mg/dL).   Medical History: Past Medical History:  Diagnosis Date   Alcohol abuse    usually drinks 2-3 drinks per day   Atherosclerosis 06/2018   Chronic sinusitis    Dehydration 02/07/2019   Emphysema of lung ([place]) 06/2018   patient unaware of this.   Hip fracture ([place]) 05/2018   no surgery   History of kidney stones 05/2018   per xray, bilateral nephrolitiasis   Hypertension    Squamous cell carcinoma of lung, right ([place]) 06/2018    Medications:  Medications Prior to Admission  Medication Sig Dispense Refill Last Dose   albuterol (VENTOLIN HFA) 108 (90 Base) MCG/ACT inhaler TAKE 2 PUFFS BY MOUTH EVERY 6 HOURS AS NEEDED FOR WHEEZE OR SHORTNESS OF BREATH 6.7 each 6    cloNIDine (CATAPRES) 0.1 MG tablet Take 0.1 mg by mouth daily.   5    diltiazem (CARDIZEM CD) 120 MG 24 hr capsule Take 120 mg by mouth daily.      folic acid (V-R FOLIC ACID) 161 MCG tablet Take 1 tablet (400 mcg total) by mouth daily. 90 tablet 1    hydrALAZINE (APRESOLINE) 100 MG tablet Take 50 mg by mouth 2 (two) times daily.       hydrochlorothiazide (HYDRODIURIL) 12.5 MG tablet Take  12.5 mg by mouth daily.       ibuprofen (ADVIL) 600 MG tablet Take 600 mg by mouth every 6 (six) hours.      KLOR-CON M20 20 MEQ tablet Take 2 tablets (40 mEq total) by mouth daily. 180 tablet 1    latanoprost (XALATAN) 0.005 % ophthalmic solution 1 drop at bedtime.      lidocaine-prilocaine (EMLA) cream Apply to affected area once 30 g 3    loratadine (CLARITIN) 10 MG tablet Take 10 mg by mouth daily.      magnesium chloride (SLOW-MAG) 64 MG TBEC SR tablet Take 1 tablet (64 mg total) by mouth 2 (two) times daily. TAKE 1 TABLET (64 MG TOTAL) BY MOUTH DAILY. 60 tablet 1    methocarbamol (ROBAXIN) 500 MG tablet Take 1 tablet (500 mg total) by mouth at bedtime. 30 tablet 0    nystatin (MYCOSTATIN) 100000 UNIT/ML suspension Take 5 mLs (500,000 Units total) by mouth 4 (four) times daily. Swish and spit. [place]  mL 0    olmesartan (BENICAR) 40 MG tablet Take 40 mg by mouth every other day.       ondansetron (ZOFRAN) 8 MG tablet Take 1 tablet (8 mg total) by mouth every 8 (eight) hours as needed for refractory nausea / vomiting. Start on day 3 after chemo. 90 tablet 3    prochlorperazine (COMPAZINE) 10 MG tablet Take 1 tablet (10 mg total) by mouth every 6 (six) hours as needed (Nausea or vomiting). 90 tablet 3    SPIRIVA HANDIHALER 18 MCG inhalation capsule INHALE 1 CAPSULE VIA HANDIHALER ONCE DAILY AT THE SAME TIME EVERY DAY 30 capsule 0    SYMBICORT 80-4.5 MCG/ACT inhaler INHALE 2 PUFFS INTO THE LUNGS IN THE MORNING AND AT BEDTIME. 10.2 each 6    vitamin B-12 (CYANOCOBALAMIN) 1000 MCG tablet Take 1 tablet (1,000 mcg total) by mouth daily. 90 tablet 1     Assessment: 55 y.o. 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 Venus US showed extensive left lowe extremity DVT. Pharmacy has been consulted for heparin dosing.  No PTA anticoagulation or APT Heparin Dosing Weight: 64 kg   Hgb: 14.7 and plts: 142  Date Time HL Rate/comment 6/24 0514 0.44 1150 units/hr  6/24 1047 0.44 1150 units/hr, therapeutic x2 6/24     2211    0.26     1150 units/hr  Goal of Therapy:  Heparin level 0.3-0.7 units/ml Monitor platelets by anticoagulation protocol: Yes   Plan:  6/24 :  HL @ 2211 = 0.26 Heparin gtt was off for ~ 2 hrs but restarted @ 1600 , level drawn 6 hrs later so result should be valid.  Will order Heparin 950 units IV X 1 bolus and increase drip rate to 1250 units/hr.  Will recheck HL 6 hrs after rate change.   Katelen Luepke D, PharmD 12/31/2020,11:15 PM

## 2020-12-31 NOTE — Progress Notes (Signed)
ANTICOAGULATION CONSULT NOTE - Initial Consult  Pharmacy Consult for Heparin  Indication: DVT  No Known Allergies  Patient Measurements: Height: 6\' 1"  (185.4 cm) Weight: 58.9 kg (129 lb 13.6 oz) IBW/kg (Calculated) : 79.9 Heparin Dosing Weight: 64 kg   Vital Signs: Temp: 98.5 F (36.9 C) (06/24 0748) BP: 145/89 (06/24 0748) Pulse Rate: 94 (06/24 0748)  Labs: Recent Labs    12/30/20 1030 12/30/20 2220 12/31/20 0514 12/31/20 1047  HGB 16.2  --  14.7  --   HCT 45.2  --  40.4  --   PLT 146*  --  142*  --   APTT  --  32  --   --   LABPROT  --  12.8  --   --   INR  --  1.0  --   --   HEPARINUNFRC  --   --  0.44 0.44  CREATININE 0.97  --  0.91  --      Estimated Creatinine Clearance: 76.4 mL/min (by C-G formula based on SCr of 0.91 mg/dL).   Medical History: Past Medical History:  Diagnosis Date   Alcohol abuse    usually drinks 2-3 drinks per day   Atherosclerosis 06/2018   Chronic sinusitis    Dehydration 02/07/2019   Emphysema of lung ([place]) 06/2018   patient unaware of this.   Hip fracture ([place]) 05/2018   no surgery   History of kidney stones 05/2018   per xray, bilateral nephrolitiasis   Hypertension    Squamous cell carcinoma of lung, right ([place]) 06/2018    Medications:  Medications Prior to Admission  Medication Sig Dispense Refill Last Dose   albuterol (VENTOLIN HFA) 108 (90 Base) MCG/ACT inhaler TAKE 2 PUFFS BY MOUTH EVERY 6 HOURS AS NEEDED FOR WHEEZE OR SHORTNESS OF BREATH 6.7 each 6    cloNIDine (CATAPRES) 0.1 MG tablet Take 0.1 mg by mouth daily.   5    diltiazem (CARDIZEM CD) 120 MG 24 hr capsule Take 120 mg by mouth daily.      folic acid (V-R FOLIC ACID) 867 MCG tablet Take 1 tablet (400 mcg total) by mouth daily. 90 tablet 1    hydrALAZINE (APRESOLINE) 100 MG tablet Take 50 mg by mouth 2 (two) times daily.       hydrochlorothiazide (HYDRODIURIL) 12.5 MG tablet Take 12.5 mg by mouth daily.       ibuprofen (ADVIL) 600 MG tablet Take 600 mg by  mouth every 6 (six) hours.      KLOR-CON M20 20 MEQ tablet Take 2 tablets (40 mEq total) by mouth daily. 180 tablet 1    latanoprost (XALATAN) 0.005 % ophthalmic solution 1 drop at bedtime.      lidocaine-prilocaine (EMLA) cream Apply to affected area once 30 g 3    loratadine (CLARITIN) 10 MG tablet Take 10 mg by mouth daily.      magnesium chloride (SLOW-MAG) 64 MG TBEC SR tablet Take 1 tablet (64 mg total) by mouth 2 (two) times daily. TAKE 1 TABLET (64 MG TOTAL) BY MOUTH DAILY. 60 tablet 1    methocarbamol (ROBAXIN) 500 MG tablet Take 1 tablet (500 mg total) by mouth at bedtime. 30 tablet 0    nystatin (MYCOSTATIN) 100000 UNIT/ML suspension Take 5 mLs (500,000 Units total) by mouth 4 (four) times daily. Swish and spit. 473 mL 0    olmesartan (BENICAR) 40 MG tablet Take 40 mg by mouth every other day.       ondansetron (ZOFRAN) 8  MG tablet Take 1 tablet (8 mg total) by mouth every 8 (eight) hours as needed for refractory nausea / vomiting. Start on day 3 after chemo. 90 tablet 3    prochlorperazine (COMPAZINE) 10 MG tablet Take 1 tablet (10 mg total) by mouth every 6 (six) hours as needed (Nausea or vomiting). 90 tablet 3    SPIRIVA HANDIHALER 18 MCG inhalation capsule INHALE 1 CAPSULE VIA HANDIHALER ONCE DAILY AT THE SAME TIME EVERY DAY 30 capsule 0    SYMBICORT 80-4.5 MCG/ACT inhaler INHALE 2 PUFFS INTO THE LUNGS IN THE MORNING AND AT BEDTIME. 10.2 each 6    vitamin B-12 (CYANOCOBALAMIN) 1000 MCG tablet Take 1 tablet (1,000 mcg total) by mouth daily. 90 tablet 1     Assessment: 55 y.o. 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 Venus US showed extensive left lowe extremity DVT. Pharmacy has been consulted for heparin dosing.  No PTA anticoagulation or APT Heparin Dosing Weight: 64 kg  Hgb: 14.7 and plts: 142  Date Time HL Rate/comment 6/24 0514 0.44 1150  units/hr  6/24 1047 0.44 1150 units/hr, therapeutic x2  Goal of Therapy:  Heparin level 0.3-0.7 units/ml Monitor platelets by anticoagulation protocol: Yes   Plan:  6/24:  HL @ 1047 = 0.44 Continue heparin infusion at 1150 units/hr Re-check heparin level tomorrow with AM labs Monitor daily HL, CBC, and s/s of bleed  Darnelle Bos, PharmD 12/31/2020,11:19 AM

## 2020-12-31 NOTE — Progress Notes (Signed)
Patient ID: Samuel Choi, male   DOB: 1965/09/21, 55 y.o.   MRN: [id number] [facility] PROGRESS NOTE  TARO HIDROGO NUU:[id number] DOB: 1965/11/01 DOA: 12/30/2020 PCP: Tracie Harrier, MD  HPI/Subjective: Patient came in with swelling and pain in his leg and found to have an extensive left lower extremity DVT.  No complaints of shortness of breath.  History of lung cancer.  Objective: Vitals:   12/31/20 1307 12/31/20 1450  BP: (!) 144/86 121/61  Pulse: 99   Resp: 20   Temp: 98.4 F (36.9 C)   SpO2: 97% 99%    Intake/Output Summary (Last 24 hours) at 12/31/2020 1505 Last data filed at 12/31/2020 0307 Gross per 24 hour  Intake 86.35 ml  Output --  Net 86.35 ml   Filed Weights   12/30/20 2009 12/31/20 0214 12/31/20 1307  Weight: 64 kg 58.9 kg 62.6 kg    ROS: Review of Systems  Respiratory:  Negative for cough and shortness of breath.   Cardiovascular:  Negative for chest pain.  Gastrointestinal:  Negative for abdominal pain, nausea and vomiting.  Musculoskeletal:  Negative for joint pain.  Exam: Physical Exam HENT:     Head: Normocephalic.     Mouth/Throat:     Pharynx: No oropharyngeal exudate.  Eyes:     General: Lids are normal.     Conjunctiva/sclera: Conjunctivae normal.     Pupils: Pupils are equal, round, and reactive to light.  Cardiovascular:     Rate and Rhythm: Normal rate and regular rhythm.     Heart sounds: Normal heart sounds, S1 normal and S2 normal.  Pulmonary:     Breath sounds: No decreased breath sounds, wheezing, rhonchi or rales.  Abdominal:     Palpations: Abdomen is soft.     Tenderness: There is no abdominal tenderness.  Musculoskeletal:     Right lower leg: No swelling.     Left lower leg: Swelling present.  Skin:    General: Skin is warm.     Findings: No rash.  Neurological:     Mental Status: He is alert and oriented to person, place, and time.     Data Reviewed: Basic Metabolic Panel: Recent Labs  Lab 12/30/20 1030  12/31/20 0514  NA 137 137  K 4.2 4.1  CL 103 102  CO2 23 24  GLUCOSE 94 100*  BUN 15 12  CREATININE 0.97 0.91  CALCIUM 9.7 9.2  MG 1.6* 2.2  PHOS  --  3.2   Liver Function Tests: Recent Labs  Lab 12/31/20 0514  AST 21  ALT 16  ALKPHOS 91  BILITOT 0.9  PROT 7.7  ALBUMIN 3.5   CBC: Recent Labs  Lab 12/30/20 1030 12/31/20 0514  WBC 10.8* 10.3  NEUTROABS 8.5*  --   HGB 16.2 14.7  HCT 45.2 40.4  MCV 107.4* 106.3*  PLT 146* 142*     Recent Results (from the past 240 hour(s))  Resp Panel by RT-PCR (Flu A&B, Covid) Nasopharyngeal Swab     Status: None   Collection Time: 12/30/20 10:20 PM   Specimen: Nasopharyngeal Swab; Nasopharyngeal(NP) swabs in vial transport medium  Result Value Ref Range Status   SARS Coronavirus 2 by RT PCR NEGATIVE NEGATIVE Final    Comment: (NOTE) SARS-CoV-2 target nucleic acids are NOT DETECTED.  The SARS-CoV-2 RNA is generally detectable in upper respiratory specimens during the acute phase of infection. The lowest concentration of SARS-CoV-2 viral copies this assay can detect is 138 copies/mL. A negative result  does not preclude SARS-Cov-2 infection and should not be used as the sole basis for treatment or other patient management decisions. A negative result may occur with  improper specimen collection/handling, submission of specimen other than nasopharyngeal swab, presence of viral mutation(s) within the areas targeted by this assay, and inadequate number of viral copies(<138 copies/mL). A negative result must be combined with clinical observations, patient history, and epidemiological information. The expected result is Negative.  Fact Sheet for Patients:  EntrepreneurPulse.com.au  Fact Sheet for Healthcare Providers:  IncredibleEmployment.be  This test is no t yet approved or cleared by the [place] FDA and  has been authorized for detection and/or diagnosis of SARS-CoV-2 by FDA under  an Emergency Use Authorization (EUA). This EUA will remain  in effect (meaning this test can be used) for the duration of the COVID-19 declaration under Section 564(b)(1) of the Act, 21 U.S.C.section 360bbb-3(b)(1), unless the authorization is terminated  or revoked sooner.       Influenza A by PCR NEGATIVE NEGATIVE Final   Influenza B by PCR NEGATIVE NEGATIVE Final    Comment: (NOTE) The Xpert Xpress SARS-CoV-2/FLU/RSV plus assay is intended as an aid in the diagnosis of influenza from Nasopharyngeal swab specimens and should not be used as a sole basis for treatment. Nasal washings and aspirates are unacceptable for Xpert Xpress SARS-CoV-2/FLU/RSV testing.  Fact Sheet for Patients: EntrepreneurPulse.com.au  Fact Sheet for Healthcare Providers: IncredibleEmployment.be  This test is not yet approved or cleared by the [place] FDA and has been authorized for detection and/or diagnosis of SARS-CoV-2 by FDA under an Emergency Use Authorization (EUA). This EUA will remain in effect (meaning this test can be used) for the duration of the COVID-19 declaration under Section 564(b)(1) of the Act, 21 U.S.C. section 360bbb-3(b)(1), unless the authorization is terminated or revoked.  Performed at [facility], [address] [place], [place] [address]      Studies: US Venous Img Lower Unilateral Left  Result Date: 12/30/2020 CLINICAL DATA:  Left leg swelling EXAM: LEFT LOWER EXTREMITY VENOUS DOPPLER ULTRASOUND TECHNIQUE: Gray-scale sonography with graded compression, as well as color Doppler and duplex ultrasound were performed to evaluate the lower extremity deep venous systems from the level of the common femoral vein and including the common femoral, femoral, profunda femoral, popliteal and calf veins including the posterior tibial, peroneal and gastrocnemius veins when visible. The superficial great saphenous vein was also  interrogated. Spectral Doppler was utilized to evaluate flow at rest and with distal augmentation maneuvers in the common femoral, femoral and popliteal veins. COMPARISON:  None. FINDINGS: Contralateral Common Femoral Vein: Respiratory phasicity is normal and symmetric with the symptomatic side. No evidence of thrombus. Normal compressibility. Common Femoral Vein: Thrombus is noted with decreased compressibility. The thrombus is occlusive in nature. Saphenofemoral Junction: No evidence of thrombus. Normal compressibility and flow on color Doppler imaging. Profunda Femoral Vein: Thrombus is noted with decreased compressibility. Femoral Vein: Thrombus is noted with decreased compressibility. Popliteal Vein: Thrombus is noted with decreased compressibility. Calf Veins: Thrombus is noted with decreased compressibility. Superficial Great Saphenous Vein: No evidence of thrombus. Normal compressibility. Venous Reflux:  None. Other Findings:  None. IMPRESSION: Extensive deep venous thrombosis is noted from the calf veins to the common femoral vein on the left. Electronically Signed   By: Inez Catalina M.D.   On: 12/30/2020 21:20    Scheduled Meds:  [MAR Hold] feeding supplement  237 mL Oral TID BM   fentaNYL  heparin sodium (porcine)       [MAR Hold] hydrALAZINE  50 mg Oral BID   [MAR Hold] irbesartan  300 mg Oral Q48H   [MAR Hold] loratadine  10 mg Oral Daily   [MAR Hold] methocarbamol  500 mg Oral QHS   midazolam       [MAR Hold] mometasone-formoterol  2 puff Inhalation BID   [MAR Hold] tiotropium  18 mcg Inhalation Daily   Continuous Infusions:  sodium chloride 75 mL/hr at 12/31/20 1229   [START ON 01/01/2021]  ceFAZolin (ANCEF) IV 2 g (12/31/20 1450)   heparin Stopped (12/31/20 1435)    Assessment/Plan:  Extensive left lower extremity DVT.  Vascular surgery took to the vascular lab for thrombolysis and thrombectomy.  Patient was on heparin drip this morning.  Upon discharge we will switch over  to Eliquis. Hypomagnesemia on presentation at 1.6.  Up to 2.2 with replacement. 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 Tasia Catchings.  Case discussed with Dr. Edison Pace today. Essential hypertension.  Admitting physician ordered to of the 5 blood pressure medications.  Blood pressure currently stable.     Code Status:     Code Status Orders  (From admission, onward)           Start     Ordered   12/30/20 2256  Full code  Continuous        12/30/20 2256           Code Status History     Date Active Date Inactive Code Status Order ID Comments User Context   08/13/2018 1420 08/24/2018 1739 Full Code [id number]  [name], MD Inpatient      Family Communication: Spoke with patient's wife on the phone and gave an update Disposition Plan: Status is: Inpatient   Dispo: The patient is from: Home              Anticipated d/c is to: Home              Patient currently went to the vascular lab today for thrombolysis and thrombectomy for left leg DVT   Difficult to place patient.  No.  Consultants: Vascular surgery  Procedures: Leg thrombectomy and thrombolysis of DVT  Time spent: 27 minutes  [place]

## 2020-12-31 NOTE — Op Note (Signed)
[facility]   OPERATIVE NOTE   PRE-OPERATIVE DIAGNOSIS: extensive left lower extremity DVT  POST-OPERATIVE DIAGNOSIS: same   PROCEDURE: 1.   US guidance for vascular access to left popliteal vein 2.   Catheter placement into left common iliac vein from left popliteal approach 3.   IVC gram and left lower extremity venogram 4.   Catheter directed thrombolysis with 6 mg of tPA to the left femoral and common femoral veins 5.   Mechanical thrombectomy to left popliteal, femoral, common femoral, and external iliac vein 6.   PTA of left common and external iliac vein with 14 mm balloon    SURGEON: Leotis Pain, MD  ASSISTANT(S): none  ANESTHESIA: local with moderate conscious sedation for 45 minutes using 5 mg of Versed and 100 mcg of Fentanyl  ESTIMATED BLOOD LOSS: 300 cc  FINDING(S): 1.  Extensive DVT from the access site in the popliteal vein up through the femoral vein, common femoral vein, and external iliac vein.  There was some narrowing in the proximal external iliac vein up to the common iliac vein where the thrombus stopped and the IVC was also patent.  SPECIMEN(S):  none  INDICATIONS:    Patient is a 55 y.o. male who presents with extensive left lower extremity DVT.  Patient has marked leg swelling and pain.  Venous intervention is performed to reduce the symtpoms and avoid long term postphlebitic symptoms.    DESCRIPTION: After obtaining full informed written consent, the patient was brought back to the vascular suite and placed supine upon the table. Moderate conscious sedation was administered during a face to face encounter with the patient throughout the procedure with my supervision of the RN administering medicines and monitoring the patient's vital signs, pulse oximetry, telemetry and mental status throughout from the start of the procedure until the patient was taken to the recovery room.  After obtaining adequate anesthesia, the patient was  prepped and draped in the standard fashion.    The patient was then placed into the prone position.  The left popliteal vein was then accessed under direct ultrasound guidance without difficulty with a micropuncture needle and a permanent image was recorded.  I then upsized to an 11Fr sheath over a J wire.  Imaging showed extensive DVT with minimal flow.  A Kumpe catheter and Magic tourque wire were then advanced into the CFV and images were performed.  There remained extensive thrombosis of the common femoral vein and external iliac vein.  I was able to cross the thrombus and stenosis and advance into the common iliac vein which was patent as was the IVC.  I then used the Kumpe catheter and instilled 6 mg of tpa throughout the femoral and common femoral veins.  After this dwelled, I used the Penumbra Cat 12 catheter and evacuated about 100 cc of effluent with mechanical thrombectomy throughout the iliac veins, CFV, SFV, and popliteal vein.  This had improvement in the popliteal and femoral vein but there remained extensive thrombus in the common femoral vein and external iliac vein.  Several more passes were made in the proximal femoral vein, common femoral vein, and external iliac vein with large amounts of thrombus removed.  After there was almost no residual thrombus present, I then turned my attention to the iliac veins.  Imaging showed at least a 60% narrowing in the proximal external iliac vein just before the hypogastric vein.  The stenosis was treated with a 14 mm diameter angioplasty balloon.  Completion  imaging following this showed only about a 20% residual stenosis.  I then elected to terminate the procedure.  The sheath was removed and a dressing was placed.  She was taken to the recovery room in stable condition having tolerated the procedure well.    COMPLICATIONS: None  CONDITION: Stable  Leotis Pain 12/31/2020 3:30 PM

## 2020-12-31 NOTE — Progress Notes (Signed)
Patient ID: Samuel Choi, male   DOB: 04-25-1966, 55 y.o.   MRN: [id number]  Episodes of Nonsustained v-tach after thrombectomy/ thrombolysis.  Patient asymptomatic.  Follows with Dr Ubaldo Glassing as outpatient.  Echo ordered.  Gave dose toprol.  Restart his usual cardizem cd.  Electrolytes normal today.  Magnesium replaced yesterday.  Cardiology consult  Dr Leslye Peer

## 2020-12-31 NOTE — Progress Notes (Signed)
ANTICOAGULATION CONSULT NOTE - Initial Consult  Pharmacy Consult for Heparin  Indication: DVT  No Known Allergies  Patient Measurements: Height: 6\' 1"  (185.4 cm) Weight: 58.9 kg (129 lb 13.6 oz) IBW/kg (Calculated) : 79.9 Heparin Dosing Weight: 64 kg   Vital Signs: Temp: 98.5 F (36.9 C) (06/24 0214) Temp Source: Oral (06/23 2009) BP: 148/99 (06/24 0214) Pulse Rate: 105 (06/24 0214)  Labs: Recent Labs    12/30/20 1030 12/30/20 2220 12/31/20 0514  HGB 16.2  --  14.7  HCT 45.2  --  40.4  PLT 146*  --  142*  APTT  --  32  --   LABPROT  --  12.8  --   INR  --  1.0  --   HEPARINUNFRC  --   --  0.44  CREATININE 0.97  --   --     Estimated Creatinine Clearance: 71.7 mL/min (by C-G formula based on SCr of 0.97 mg/dL).   Medical History: Past Medical History:  Diagnosis Date   Alcohol abuse    usually drinks 2-3 drinks per day   Atherosclerosis 06/2018   Chronic sinusitis    Dehydration 02/07/2019   Emphysema of lung ([place]) 06/2018   patient unaware of this.   Hip fracture ([place]) 05/2018   no surgery   History of kidney stones 05/2018   per xray, bilateral nephrolitiasis   Hypertension    Squamous cell carcinoma of lung, right ([place]) 06/2018    Medications:  Medications Prior to Admission  Medication Sig Dispense Refill Last Dose   albuterol (VENTOLIN HFA) 108 (90 Base) MCG/ACT inhaler TAKE 2 PUFFS BY MOUTH EVERY 6 HOURS AS NEEDED FOR WHEEZE OR SHORTNESS OF BREATH 6.7 each 6    cloNIDine (CATAPRES) 0.1 MG tablet Take 0.1 mg by mouth daily.   5    diltiazem (CARDIZEM CD) 120 MG 24 hr capsule Take 120 mg by mouth daily.      folic acid (V-R FOLIC ACID) 761 MCG tablet Take 1 tablet (400 mcg total) by mouth daily. 90 tablet 1    hydrALAZINE (APRESOLINE) 100 MG tablet Take 50 mg by mouth 2 (two) times daily.       hydrochlorothiazide (HYDRODIURIL) 12.5 MG tablet Take 12.5 mg by mouth daily.       ibuprofen (ADVIL) 600 MG tablet Take 600 mg by mouth every 6 (six) hours.       KLOR-CON M20 20 MEQ tablet Take 2 tablets (40 mEq total) by mouth daily. 180 tablet 1    latanoprost (XALATAN) 0.005 % ophthalmic solution 1 drop at bedtime.      lidocaine-prilocaine (EMLA) cream Apply to affected area once 30 g 3    loratadine (CLARITIN) 10 MG tablet Take 10 mg by mouth daily.      magnesium chloride (SLOW-MAG) 64 MG TBEC SR tablet Take 1 tablet (64 mg total) by mouth 2 (two) times daily. TAKE 1 TABLET (64 MG TOTAL) BY MOUTH DAILY. 60 tablet 1    methocarbamol (ROBAXIN) 500 MG tablet Take 1 tablet (500 mg total) by mouth at bedtime. 30 tablet 0    nystatin (MYCOSTATIN) 100000 UNIT/ML suspension Take 5 mLs (500,000 Units total) by mouth 4 (four) times daily. Swish and spit. 473 mL 0    olmesartan (BENICAR) 40 MG tablet Take 40 mg by mouth every other day.       ondansetron (ZOFRAN) 8 MG tablet Take 1 tablet (8 mg total) by mouth every 8 (eight) hours as needed for refractory  nausea / vomiting. Start on day 3 after chemo. 90 tablet 3    prochlorperazine (COMPAZINE) 10 MG tablet Take 1 tablet (10 mg total) by mouth every 6 (six) hours as needed (Nausea or vomiting). 90 tablet 3    SPIRIVA HANDIHALER 18 MCG inhalation capsule INHALE 1 CAPSULE VIA HANDIHALER ONCE DAILY AT THE SAME TIME EVERY DAY 30 capsule 0    SYMBICORT 80-4.5 MCG/ACT inhaler INHALE 2 PUFFS INTO THE LUNGS IN THE MORNING AND AT BEDTIME. 10.2 each 6    vitamin B-12 (CYANOCOBALAMIN) 1000 MCG tablet Take 1 tablet (1,000 mcg total) by mouth daily. 90 tablet 1     Assessment: Pharmacy consulted to dose heparin in this 55 year old male admitted with newly diagnosed DVT.  CrCl = 74.8 ml/min  No prior anticoag noted.   Goal of Therapy:  Heparin level 0.3-0.7 units/ml Monitor platelets by anticoagulation protocol: Yes   Plan:  6/24:  HL @ 0514 = 0.44 Will continue pt on current rate and draw confirmation level on 6/24 @ 1100.   Carroll Lingelbach D 12/31/2020,6:25 AM

## 2020-12-31 NOTE — Progress Notes (Signed)
Initial Nutrition Assessment  DOCUMENTATION CODES:  Non-severe (moderate) malnutrition in context of chronic illness, Underweight  INTERVENTION:  Advance to regular diet after procedure Pending diet advancement, Ensure Enlive po TID, each supplement provides 350 kcal and 20 grams of protein  NUTRITION DIAGNOSIS:  Moderate Malnutrition (in the context of chronic illness) related to cancer and cancer related treatments as evidenced by mild fat depletion, moderate fat depletion, moderate muscle depletion, severe muscle depletion, percent weight loss (10% weight loss x 6 months).  GOAL:  Patient will meet greater than or equal to 90% of their needs  MONITOR:  PO intake, Diet advancement, Supplement acceptance, Labs, Weight trends  REASON FOR ASSESSMENT:  Malnutrition Screening Tool, Other (Comment) (low BMI)    ASSESSMENT:  55 y.o. 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  Found to have extensive acute left lower extremity DVT.  Pending vascular surgery evaluation for potential thrombectomy.  Pt resting in bed at the time of visit watching TV. Currently NPO for planned thrombectomy/thrombolysis this afternoon. Pt with significant fat and muscle deficits on exam. 10.1% weight loss noted in the last 6 months (12/30-6/24).  Pt reports a good appetite at home. States that he eats 3 meals a day and will sometimes have snacks between. Drinks 2 ensure supplements daily as well. Reports aside from fatigue, does not have any side effects from chemo. Gave tips for increasing kcal and protein in meals and snacks at home.   Nutritionally Relevant Medications: PRN Meds: ondansetron  Labs Reviewed  NUTRITION - FOCUSED PHYSICAL EXAM: Flowsheet Row Most Recent Value  Orbital Region Mild depletion  Upper Arm Region Severe depletion   Thoracic and Lumbar Region Moderate depletion  Buccal Region Mild depletion  Temple Region Mild depletion  Clavicle Bone Region Mild depletion  Clavicle and Acromion Bone Region Moderate depletion  Scapular Bone Region Moderate depletion  Dorsal Hand Moderate depletion  Patellar Region Severe depletion  Anterior Thigh Region Severe depletion  Posterior Calf Region Severe depletion  Edema (RD Assessment) None  Hair Reviewed  Eyes Reviewed  Mouth Reviewed  Skin Reviewed  Nails Reviewed   Diet Order:   Diet Order             Diet NPO time specified Except for: Sips with Meds, Ice Chips  Diet effective midnight                   EDUCATION NEEDS:  Education needs have been addressed  Skin:  Skin Assessment: Reviewed RN Assessment  Last BM:  6/23  Height:  Ht Readings from Last 1 Encounters:  12/31/20 6\' 1"  (1.854 m)    Weight:  Wt Readings from Last 1 Encounters:  12/31/20 62.6 kg    Ideal Body Weight:  83.6 kg  BMI:  Body mass index is 18.21 kg/m.  Estimated Nutritional Needs:  Kcal:  2100-2300 kcal/d Protein:  110-120 g/d Fluid:  >2.2L/d   Ranell Patrick, RD, LDN Clinical Dietitian Pager on [place]

## 2020-12-31 NOTE — Consult Note (Signed)
Hematology/Oncology Consult note [facility] [phone] [phone]  Patient Care Team: Tracie Harrier, MD as PCP - General (Internal Medicine) Earlie Server, MD as Consulting Physician (Hematology and Oncology)   Name of the patient: Samuel Choi  [id number]  1966-06-14   Date of visit: 12/31/20 REASON FOR COSULTATION:  Extensive left lower extremity DVT  REQUEST PHYSICIAN Dr.Wieting.  History of presenting illness-  55 y.o. male with PMH listed at below who presents to ER for evaluation of left lower extremity swelling, and pain. He noticed left lower extremity calf tenderness/cramp which progressively getting worse over the past few days.  Present to the ER for evaluation.  Denies any shortness of breath, hemoptysis chest pain. 12/30/2020 left lower extremity venous ultrasound showed extensive DVT from calf veins to the common femoral vein on the left.  Patient was admitted and started on heparin.  Was evaluated by vascular surgeon and there is plan for thrombectomy/thrombolysis this afternoon.  Patient is known to oncology service for recurrent non-small cell lung cancer, currently on immunotherapy maintenance with recent CT chest showing treatment response. Patient denies any prior history of blood clots, any immobilization situation or injury of lower extremity.   Review of Systems  Constitutional:  Negative for appetite change, chills, fatigue, fever and unexpected weight change.  HENT:   Negative for hearing loss and voice change.   Eyes:  Negative for eye problems and icterus.  Respiratory:  Negative for chest tightness, cough and shortness of breath.   Cardiovascular:  Negative for chest pain and leg swelling.  Gastrointestinal:  Negative for abdominal distention and abdominal pain.  Endocrine: Negative for hot flashes.  Genitourinary:  Negative for difficulty urinating, dysuria and frequency.   Musculoskeletal:  Negative for  arthralgias.       Left lower extremity calf tenderness and swelling  Skin:  Negative for itching and rash.  Neurological:  Negative for light-headedness and numbness.  Hematological:  Negative for adenopathy. Does not bruise/bleed easily.  Psychiatric/Behavioral:  Negative for confusion.    No Known Allergies  Patient Active Problem List   Diagnosis Date Noted   Acute deep vein thrombosis (DVT) (HCC) 12/30/2020   Chemotherapy induced neutropenia ([place]) 11/09/2020   COPD exacerbation ([place]) 04/22/2020   Cough 03/31/2020   Sinus tachycardia 12/18/2019   Hypomagnesemia 12/10/2019   Encounter for antineoplastic immunotherapy 11/24/2019   Ex-smoker 07/23/2019   Palpitations 06/13/2019   Port-A-Cath in place 04/04/2019   Thrombocytopenia ([place]) 04/04/2019   Folate deficiency 04/04/2019   Hypokalemia 04/04/2019   B12 deficiency 04/04/2019   Alcohol abuse 04/04/2019   Dehydration 02/07/2019   Encounter for antineoplastic chemotherapy 01/31/2019   Squamous cell carcinoma of lung, right ([place]) 09/03/2018   Goals of care, counseling/discussion 09/03/2018   Malnutrition of moderate degree 08/23/2018   Lung mass 08/13/2018   Adenopathy    Emphysema of lung ([place]) 06/2018   Allergic rhinitis 06/2018   H/O nasal polyp 02/20/2018   Acute sinusitis 10/10/2017   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/10/2017   Chronic rhinitis 10/10/2017   Mass of sinus 10/10/2017   Benign essential hypertension 09/27/2017   Tobacco abuse 09/27/2017     Past Medical History:  Diagnosis Date   Alcohol abuse    usually drinks 2-3 drinks per day   Atherosclerosis 06/2018   Chronic sinusitis    Dehydration 02/07/2019   Emphysema of lung ([place]) 06/2018   patient unaware of this.   Hip fracture ([place]) 05/2018   no surgery   History  of kidney stones 05/2018   per xray, bilateral nephrolitiasis   Hypertension    Squamous cell carcinoma of lung, right ([place]) 06/2018     Past Surgical History:  Procedure Laterality Date    BRAIN SURGERY  10/2017   nasal/sinus endoscopy. mass benign   ELECTROMAGNETIC NAVIGATION BROCHOSCOPY Right 06/25/2018   Procedure: ELECTROMAGNETIC NAVIGATION BRONCHOSCOPY;  Surgeon: [name], MD;  Location: [facility] ORS;  Service: Cardiopulmonary;  Laterality: Right;   ENDOBRONCHIAL ULTRASOUND Right 06/25/2018   Procedure: ENDOBRONCHIAL ULTRASOUND;  Surgeon: [name], MD;  Location: [facility] OR[facility];  Service: Cardiopulmonary;  Laterality: Right;   ENDOBRONCHIAL ULTRASOUND Right 12/26/2018   Procedure: ENDOBRONCHIAL ULTRASOUND RIGHT;  Surgeon: [name], MD;  Location: [facility];  Service: Cardiopulmonary;  Laterality: Right;   FLEXIBLE BRONCHOSCOPY N/A 08/20/2018   Procedure: FLEXIBLE BRONCHOSCOPY PREOP;  Surgeon: [name], MD;  Location: [facility] OR[facility];  Service: Thoracic;  Laterality: N/A;   IR CV LINE INJECTION  04/04/2019   NASAL SINUS SURGERY  10/2017   At [facility], frontal sinusotomy, ethmoidectomy, resection anterior cranial fossa neoplasm, turbinate resection   PORTACATH PLACEMENT Left 01/15/2019   Procedure: INSERTION PORT-A-CATH;  Surgeon: [name], MD;  Location: [facility];  Service: General;  Laterality: Left;   THORACOTOMY Right 08/13/2018   Procedure: PREOP BROCHOSCOPY WITH RIGHT THORACOTOMY AND RUL RESECTION;  Surgeon: [name], MD;  Location: [facility];  Service: General;  Laterality: Right;   THORACOTOMY Right 08/20/2018   Procedure: THORACOTOMY MAJOR RIGHT UPPER LOBE LOBECTOMY;  Surgeon: [name], MD;  Location: [facility];  Service: Thoracic;  Laterality: Right;   TOE SURGERY Left    pin in left toe    Social History   Socioeconomic History   Marital status: Married    Spouse name: lisa   Number of children: Not on file   Years of education: Not on file   Highest education level: Not on file  Occupational History   Occupation: welding    Comment: taking time off to resolve issues  Tobacco Use   Smoking status: Former    Packs/day: 0.50    Years: 15.00    Pack  years: 7.50    Types: Cigarettes    Quit date: 08/2018    Years since quitting: 2.3   Smokeless tobacco: Never  Vaping Use   Vaping Use: Never used  Substance and Sexual Activity   Alcohol use: Yes    Alcohol/week: 3.0 standard drinks    Types: 3 Cans of beer per week    Comment: usually 2 drinks per week, per patient   Drug use: No   Sexual activity: Not on file  Other Topics Concern   Not on file  Social History Narrative   Not on file   Social Determinants of Health   Financial Resource Strain: Not on file  Food Insecurity: Not on file  Transportation Needs: Not on file  Physical Activity: Not on file  Stress: Not on file  Social Connections: Not on file  Intimate Partner Violence: Not on file     Family History  Problem Relation Age of Onset   Breast cancer Mother    Diabetes Mother    Lung cancer Father    Hypertension Father      Current Facility-Administered Medications:    0.9 %  sodium chloride infusion, , Intravenous, Continuous, Stegmayer, Janalyn Harder, PA-C, Last Rate: 75 mL/hr at 12/31/20 1229, New Bag at 12/31/20 1229   acetaminophen (TYLENOL) tablet 650 mg, 650 mg, Oral, Q6H PRN **  OR** acetaminophen (TYLENOL) suppository 650 mg, 650 mg, Rectal, Q6H PRN, Howerter, Justin B, DO   albuterol (PROVENTIL) (2.5 MG/3ML) 0.083% nebulizer solution 2.5 mg, 2.5 mg, Inhalation, Q6H PRN, Howerter, Justin B, DO   [START ON 01/01/2021] feeding supplement (ENSURE ENLIVE / ENSURE PLUS) liquid 237 mL, 237 mL, Oral, TID BM, Wieting, Richard, MD   heparin ADULT infusion 100 units/mL (25000 units/232m), 1,150 Units/hr, Intravenous, Continuous, Howerter, Justin B, DO, Last Rate: 11.5 mL/hr at 12/30/20 2257, 1,150 Units/hr at 12/30/20 2257   hydrALAZINE (APRESOLINE) tablet 50 mg, 50 mg, Oral, BID, Howerter, Justin B, DO, 50 mg at 12/31/20 1241   HYDROcodone-acetaminophen (NORCO/VICODIN) 5-325 MG per tablet 1 tablet, 1 tablet, Oral, Q6H PRN, Howerter, Justin B, DO   [START ON  01/01/2021] irbesartan (AVAPRO) tablet 300 mg, 300 mg, Oral, Q48H, Howerter, Justin B, DO   loratadine (CLARITIN) tablet 10 mg, 10 mg, Oral, Daily, Howerter, Justin B, DO, 10 mg at 12/31/20 1241   methocarbamol (ROBAXIN) tablet 500 mg, 500 mg, Oral, QHS, Howerter, Justin B, DO, 500 mg at 12/31/20 0121   mometasone-formoterol (DULERA) 100-5 MCG/ACT inhaler 2 puff, 2 puff, Inhalation, BID, Howerter, Justin B, DO, 2 puff at 12/31/20 1247   ondansetron (ZOFRAN) injection 4 mg, 4 mg, Intravenous, Q6H PRN, Howerter, Justin B, DO   tiotropium (SPIRIVA) inhalation capsule ([facility] use ONLY) 18 mcg, 18 mcg, Inhalation, Daily, Howerter, Justin B, DO, 18 mcg at 12/31/20 1246  Facility-Administered Medications Ordered in Other Encounters:    albuterol (PROVENTIL) (2.5 MG/3ML) 0.083% nebulizer solution 2.5 mg, 2.5 mg, Nebulization, Once, System, Provider Not In   heparin lock flush 100 unit/mL, 500 Units, Intravenous, Once, YEarlie Server MD   sodium chloride flush (NS) 0.9 % injection 10 mL, 10 mL, Intravenous, PRN, YEarlie Server MD, 10 mL at 04/04/19 0903   sodium chloride flush (NS) 0.9 % injection 10 mL, 10 mL, Intravenous, PRN, YEarlie Server MD, 10 mL at 07/26/20 1308   Physical exam:  Vitals:   12/31/20 0214 12/31/20 0748 12/31/20 1239 12/31/20 1241  BP: (!) 148/99 (!) 145/89 (!) 138/99 (!) 138/99  Pulse: (!) 105 94 92   Resp: 20 20    Temp: 98.5 F (36.9 C) 98.5 F (36.9 C)    TempSrc:      SpO2: 98% 97% 99%   Weight: 129 lb 13.6 oz (58.9 kg)     Height: _0  (1.854 m)      Physical Exam Constitutional:      General: He is not in acute distress.    Appearance: He is not diaphoretic.  HENT:     Head: Normocephalic and atraumatic.     Nose: Nose normal.     Mouth/Throat:     Pharynx: No oropharyngeal exudate.  Eyes:     General: No scleral icterus.    Pupils: Pupils are equal, round, and reactive to light.  Cardiovascular:     Rate and Rhythm: Normal rate and regular rhythm.     Heart sounds: No  murmur heard. Pulmonary:     Effort: Pulmonary effort is normal. No respiratory distress.     Breath sounds: Normal breath sounds. No rales.  Chest:     Chest wall: No tenderness.  Abdominal:     General: There is no distension.     Palpations: Abdomen is soft.     Tenderness: There is no abdominal tenderness.  Musculoskeletal:        General: Normal range of motion.     Cervical  back: Normal range of motion and neck supple.     Comments: Left lower extremity nonpitting edema  Skin:    General: Skin is warm and dry.     Findings: No erythema.  Neurological:     Mental Status: He is alert and oriented to person, place, and time.     Cranial Nerves: No cranial nerve deficit.     Motor: No abnormal muscle tone.     Coordination: Coordination normal.  Psychiatric:        Mood and Affect: Affect normal.        CMP Latest Ref Rng & Units 12/31/2020  Glucose 70 - 99 mg/dL 100(H)  BUN 6 - 20 mg/dL 12  Creatinine 0.61 - 1.24 mg/dL 0.91  Sodium 135 - 145 mmol/L 137  Potassium 3.5 - 5.1 mmol/L 4.1  Chloride 98 - 111 mmol/L 102  CO2 22 - 32 mmol/L 24  Calcium 8.9 - 10.3 mg/dL 9.2  Total Protein 6.5 - 8.1 g/dL 7.7  Total Bilirubin 0.3 - 1.2 mg/dL 0.9  Alkaline Phos 38 - 126 U/L 91  AST 15 - 41 U/L 21  ALT 0 - 44 U/L 16   CBC Latest Ref Rng & Units 12/31/2020  WBC 4.0 - 10.5 K/uL 10.3  Hemoglobin 13.0 - 17.0 g/dL 14.7  Hematocrit 39.0 - 52.0 % 40.4  Platelets 150 - 400 K/uL 142(L)    RADIOGRAPHIC STUDIES: I have personally reviewed the radiological images as listed and agreed with the findings in the report. CT Chest Wo Contrast  Result Date: 12/16/2020 CLINICAL DATA:  Lung cancer surveillance. EXAM: CT CHEST WITHOUT CONTRAST TECHNIQUE: Multidetector CT imaging of the chest was performed following the standard protocol without IV contrast. COMPARISON:  Multiple prior examinations. The most recent is 10/15/2020. FINDINGS: Cardiovascular: The heart is normal in size. No  pericardial effusion. Stable tortuosity and calcification of the thoracic aorta and stable scattered coronary artery calcifications. Mediastinum/Nodes: No mediastinal or hilar mass or overt adenopathy. The esophagus is grossly. The left-sided Port-A-Cath is in good position, unchanged. Lungs/Pleura: Stable underlying emphysematous changes. Stable postoperative and post radiation changes involving the right hilum and right paramediastinal lung with dense radiation fibrosis associated bronchiectasis. I do not see any findings suspicious for recurrent tumor. No worrisome pulmonary nodules to suggest pulmonary metastatic disease. Small pulmonary nodules identified on more remote chest CT scans have resolved. No new pulmonary lesions. No pleural effusions or pleural lesions. Upper Abdomen: No significant upper abdominal findings. No obvious hepatic or adrenal gland lesions. Stable aortic calcifications and bilateral renal calculi. Musculoskeletal: No chest wall mass, supraclavicular or axillary adenopathy. The bony thorax is intact. IMPRESSION: 1. Stable postoperative and post radiation changes involving the right hilum and right paramediastinal lung. No findings suspicious for recurrent tumor. 2. No mediastinal or hilar mass or adenopathy. 3. Stable emphysematous changes. 4. Stable atherosclerotic calcifications involving the aorta and coronary arteries. 5. Emphysema and aortic atherosclerosis. 6. Small pulmonary nodules identified on more remote chest CT scans have resolved. No new or worrisome pulmonary lesions. Aortic Atherosclerosis (ICD10-I70.0) and Emphysema (ICD10-J43.9). Electronically Signed   By: Marijo Sanes M.D.   On: 12/16/2020 11:44   US Venous Img Lower Unilateral Left  Result Date: 12/30/2020 CLINICAL DATA:  Left leg swelling EXAM: LEFT LOWER EXTREMITY VENOUS DOPPLER ULTRASOUND TECHNIQUE: Gray-scale sonography with graded compression, as well as color Doppler and duplex ultrasound were performed to  evaluate the lower extremity deep venous systems from the level of the common femoral vein  and including the common femoral, femoral, profunda femoral, popliteal and calf veins including the posterior tibial, peroneal and gastrocnemius veins when visible. The superficial great saphenous vein was also interrogated. Spectral Doppler was utilized to evaluate flow at rest and with distal augmentation maneuvers in the common femoral, femoral and popliteal veins. COMPARISON:  None. FINDINGS: Contralateral Common Femoral Vein: Respiratory phasicity is normal and symmetric with the symptomatic side. No evidence of thrombus. Normal compressibility. Common Femoral Vein: Thrombus is noted with decreased compressibility. The thrombus is occlusive in nature. Saphenofemoral Junction: No evidence of thrombus. Normal compressibility and flow on color Doppler imaging. Profunda Femoral Vein: Thrombus is noted with decreased compressibility. Femoral Vein: Thrombus is noted with decreased compressibility. Popliteal Vein: Thrombus is noted with decreased compressibility. Calf Veins: Thrombus is noted with decreased compressibility. Superficial Great Saphenous Vein: No evidence of thrombus. Normal compressibility. Venous Reflux:  None. Other Findings:  None. IMPRESSION: Extensive deep venous thrombosis is noted from the calf veins to the common femoral vein on the left. Electronically Signed   By: Inez Catalina M.D.   On: 12/30/2020 21:20    Assessment and plan  #Unprovoked left lower extremity proximal DVT, Agree with heparin ggt for anticoagulation. Vascular surgeon note reviewed.  Thrombectomy/thrombolysis this afternoon. At discharge, recommend switching to Eliquis as outpatient anticoagulation regimen.  #Recurrent non-small cell lung cancer, currently on immunotherapy maintenance. Patient will follow-up with me outpatient.  Recommend him to keep the current scheduled appointment in July with me.  If he experiences any  problems between discharge and his next appointment, he will call the clinic.   Thank you for allowing me to participate in the care of this patient.   Earlie Server, MD, PhD Hematology Oncology [facility] at [facility] Pager- [phone] 12/31/2020

## 2020-12-31 NOTE — Consult Note (Signed)
[facility] Vascular Consult Note  MRN : [id number]  Samuel Choi is a 55 y.o. 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  Patient notes progressively worsening swelling and discomfort over the last 2 weeks.  Denies any trauma, travel/prolonged immobility, family history or personal history of DVT/PE.  He denies any chest pain, nausea or vomiting or shortness of breath.  Venous duplex was notable for extensive DVT in the calf veins tracking proximately to the common femoral vein on the left lower extremity.  Vascular surgery was consulted by Dr. Leslye Peer for possible endovascular intervention in the setting of extensive DVT.  Current Facility-Administered Medications  Medication Dose Route Frequency Provider Last Rate Last Admin   acetaminophen (TYLENOL) tablet 650 mg  650 mg Oral Q6H PRN Howerter, Justin B, DO       Or   acetaminophen (TYLENOL) suppository 650 mg  650 mg Rectal Q6H PRN Howerter, Justin B, DO       albuterol (PROVENTIL) (2.5 MG/3ML) 0.083% nebulizer solution 2.5 mg  2.5 mg Inhalation Q6H PRN Howerter, Justin B, DO       heparin ADULT infusion 100 units/mL (25000 units/261m)  1,150 Units/hr Intravenous Continuous Howerter, Justin B, DO 11.5 mL/hr at 12/30/20 2257 1,150 Units/hr at 12/30/20 2257   hydrALAZINE (APRESOLINE) tablet 50 mg  50 mg Oral BID Howerter, Justin B, DO   50 mg at 12/30/20 2355   HYDROcodone-acetaminophen (NORCO/VICODIN) 5-325 MG per tablet 1 tablet  1 tablet Oral Q6H PRN Howerter, Justin B, DO       [START ON 01/01/2021] irbesartan (AVAPRO) tablet 300  mg  300 mg Oral Q48H Howerter, Justin B, DO       loratadine (CLARITIN) tablet 10 mg  10 mg Oral Daily Howerter, Justin B, DO       methocarbamol (ROBAXIN) tablet 500 mg  500 mg Oral QHS Howerter, Justin B, DO   500 mg at 12/31/20 0121   mometasone-formoterol (DULERA) 100-5 MCG/ACT inhaler 2 puff  2 puff Inhalation BID Howerter, Justin B, DO       ondansetron (ZOFRAN) injection 4 mg  4 mg Intravenous Q6H PRN Howerter, Justin B, DO       tiotropium (SPIRIVA) inhalation capsule ([facility] use ONLY) 18 mcg  18 mcg Inhalation Daily Howerter, Justin B, DO       Facility-Administered Medications Ordered in Other Encounters  Medication Dose Route Frequency Provider Last Rate Last Admin   albuterol (PROVENTIL) (2.5 MG/3ML) 0.083% nebulizer solution 2.5 mg  2.5 mg Nebulization Once System, Provider Not In       heparin lock flush 100 unit/mL  500 Units Intravenous Once YEarlie Server MD       sodium chloride flush (NS) 0.9 % injection 10 mL  10 mL Intravenous PRN YEarlie Server MD   10 mL at 04/04/19 0903   sodium chloride flush (NS) 0.9 % injection 10 mL  10 mL Intravenous PRN YEarlie Server MD   10 mL at 07/26/20 1308   Past Medical History:  Diagnosis Date   Alcohol abuse    usually drinks 2-3 drinks per day   Atherosclerosis  06/2018   Chronic sinusitis    Dehydration 02/07/2019   Emphysema of lung ([place]) 06/2018   patient unaware of this.   Hip fracture ([place]) 05/2018   no surgery   History of kidney stones 05/2018   per xray, bilateral nephrolitiasis   Hypertension    Squamous cell carcinoma of lung, right ([place]) 06/2018   Past Surgical History:  Procedure Laterality Date   BRAIN SURGERY  10/2017   nasal/sinus endoscopy. mass benign   ELECTROMAGNETIC NAVIGATION BROCHOSCOPY Right 06/25/2018   Procedure: ELECTROMAGNETIC NAVIGATION BRONCHOSCOPY;  Surgeon: [name], MD;  Location: [facility] ORS;  Service: Cardiopulmonary;  Laterality: Right;   ENDOBRONCHIAL ULTRASOUND Right 06/25/2018   Procedure: ENDOBRONCHIAL  ULTRASOUND;  Surgeon: [name], MD;  Location: [facility] ORS;  Service: Cardiopulmonary;  Laterality: Right;   ENDOBRONCHIAL ULTRASOUND Right 12/26/2018   Procedure: ENDOBRONCHIAL ULTRASOUND RIGHT;  Surgeon: [name], MD;  Location: [facility] OR[facility];  Service: Cardiopulmonary;  Laterality: Right;   FLEXIBLE BRONCHOSCOPY N/A 08/20/2018   Procedure: [place];  Surgeon: [name], MD;  Location: [facility] OR[facility];  Service: Thoracic;  Laterality: N/A;   IR CV LINE INJECTION  04/04/2019   NASAL SINUS SURGERY  10/2017   At [facility], frontal sinusotomy, ethmoidectomy, resection anterior cranial fossa neoplasm, turbinate resection   PORTACATH PLACEMENT Left 01/15/2019   Procedure: INSERTION PORT-A-CATH;  Surgeon: [name], MD;  Location: [facility] ORS;  Service: General;  Laterality: Left;   THORACOTOMY Right 08/13/2018   Procedure: PREOP BROCHOSCOPY WITH RIGHT THORACOTOMY AND RUL RESECTION;  Surgeon: [name], MD;  Location: [facility] OR[facility];  Service: General;  Laterality: Right;   THORACOTOMY Right 08/20/2018   Procedure: THORACOTOMY MAJOR RIGHT UPPER LOBE LOBECTOMY;  Surgeon: [name], MD;  Location: [facility];  Service: Thoracic;  Laterality: Right;   TOE SURGERY Left    pin in left toe   Social History Social History   Tobacco Use   Smoking status: Former    Packs/day: 0.50    Years: 15.00    Pack years: 7.50    Types: Cigarettes    Quit date: 08/2018    Years since quitting: 2.3   Smokeless tobacco: Never  Vaping Use   Vaping Use: Never used  Substance Use Topics   Alcohol use: Yes    Alcohol/week: 3.0 standard drinks    Types: 3 Cans of beer per week    Comment: usually 2 drinks per week, per patient   Drug use: No   Family History Family History  Problem Relation Age of Onset   Breast cancer Mother    Diabetes Mother    Lung cancer Father    Hypertension Father   Denies family history of peripheral artery disease, venous disease or renal disease.  No Known  Allergies  REVIEW OF SYSTEMS (Negative unless checked)  Constitutional: _0 Weight loss  _1 Fever  _2 Chills Cardiac: _3 Chest pain   _4 Chest pressure   _5 Palpitations   _6 Shortness of breath when laying flat   _7 Shortness of breath at rest   _8 Shortness of breath with exertion. Vascular:  _9 Pain in legs with walking   _10 Pain in legs at rest   _11 Pain in legs when laying flat   _12 Claudication   _13 Pain in feet when walking  _14 Pain in feet at rest  _15 Pain in feet when laying flat   _16 History of DVT   _17 Phlebitis   _18 Swelling in legs   _19 Varicose veins   _20 Non-healing ulcers Pulmonary:   _21 Uses home oxygen   _22 Productive cough   _23 Hemoptysis   _24   Wheeze  _0 COPD   _1 Asthma Neurologic:  _2 Dizziness  _3 Blackouts   _4 Seizures   _5 History of stroke   _6 History of TIA  _7 Aphasia   _8 Temporary blindness   _9 Dysphagia   _10 Weakness or numbness in arms   _11 Weakness or numbness in legs Musculoskeletal:  _12 Arthritis   _13 Joint swelling   _14 Joint pain   _15 Low back pain Hematologic:  _16 Easy bruising  _17 Easy bleeding   _18 Hypercoagulable state   _19 Anemic  _20 Hepatitis Gastrointestinal:  _21 Blood in stool   _22 Vomiting blood  _23 Gastroesophageal reflux/heartburn   _24 Difficulty swallowing. Genitourinary:  _25 Chronic kidney disease   _26 Difficult urination  _27 Frequent urination  _28 Burning with urination   _29 Blood in urine Skin:  _30 Rashes   _31 Ulcers   _32 Wounds Psychological:  _33 History of anxiety   _34  History of major depression.  Physical Examination  Vitals:   12/30/20 2009 12/31/20 0132 12/31/20 0214 12/31/20 0748  BP: (!) 145/98 (!) 158/94 (!) 148/99 (!) 145/89  Pulse: 96 (!) 55 (!) 105 94  Resp: _35 Temp: 98.5 F (36.9 C)  98.5 F (36.9 C) 98.5 F (36.9 C)  TempSrc: Oral     SpO2: 97% 96% 98% 97%  Weight: 64 kg  58.9 kg   Height: _36  (1.854 m)  _37  (1.854 m)    Body mass index is 17.13 kg/m. Gen:  WD/WN, NAD Head: [place]/AT, No temporalis wasting. Prominent temp pulse not noted. Ear/Nose/Throat:  Hearing grossly intact, nares w/o erythema or drainage, oropharynx w/o Erythema/Exudate Eyes: Sclera non-icteric, conjunctiva clear Neck: Trachea midline.  No JVD.  Pulmonary:  Good air movement, respirations not labored, equal bilaterally.  Cardiac: RRR, normal S1, S2. Vascular:  Vessel Right Left  Radial Palpable Palpable  Ulnar Palpable Palpable  Brachial Palpable Palpable  Carotid Palpable, without bruit Palpable, without bruit  Aorta Not palpable N/A  Femoral Palpable Palpable  Popliteal Palpable Palpable  PT Palpable Palpable  DP Palpable Palpable   Left lower extremity: Mild edema however is tender to palpation.  Thigh soft.  Calf soft.  Extremities warm distally to toes.  Motor/sensory is intact.  Gastrointestinal: soft, non-tender/non-distended. No guarding/reflex.  Musculoskeletal: M/S 5/5 throughout.  Extremities without ischemic changes.  No deformity or atrophy. No edema. Neurologic: Sensation grossly intact in extremities.  Symmetrical.  Speech is fluent. Motor exam as listed above. Psychiatric: Judgment intact, Mood & affect appropriate for pt's clinical situation. Dermatologic: No rashes or ulcers noted.  No cellulitis or open wounds. Lymph : No Cervical, Axillary, or Inguinal lymphadenopathy.  CBC Lab Results  Component Value Date   WBC 10.3 12/31/2020   HGB 14.7 12/31/2020   HCT 40.4 12/31/2020   MCV 106.3 (H) 12/31/2020   PLT 142 (L) 12/31/2020   BMET    Component Value Date/Time   NA 137 12/31/2020 0514   NA 137 11/02/2014 1859   K 4.1 12/31/2020 0514   K 3.5 11/02/2014 1859   CL 102 12/31/2020 0514   CL 101 11/02/2014 1859   CO2 24 12/31/2020 0514   CO2 26 11/02/2014 1859   GLUCOSE 100 (H) 12/31/2020 0514   GLUCOSE 100 (H) 11/02/2014 1859   BUN 12 12/31/2020 0514   BUN 11 11/02/2014 1859   CREATININE 0.91 12/31/2020 0514   CREATININE 1.06 11/02/2014 1859   CALCIUM 9.2 12/31/2020 0514   CALCIUM 8.9 11/02/2014 1859   GFRNONAA >60 12/31/2020  0514   GFRNONAA >60 11/02/2014 1859   GFRAA >60 04/07/2020 0854   GFRAA >60 11/02/2014 1859  Estimated Creatinine Clearance: 76.4 mL/min (by C-G formula based on SCr of 0.91 mg/dL).  COAG Lab Results  Component Value Date   INR 1.0 12/30/2020   INR 1.0 01/15/2019   INR 0.97 08/19/2018   Radiology CT Chest Wo Contrast  Result Date: 12/16/2020 CLINICAL DATA:  Lung cancer surveillance. EXAM: CT CHEST WITHOUT CONTRAST TECHNIQUE: Multidetector CT imaging of the chest was performed following the standard protocol without IV contrast. COMPARISON:  Multiple prior examinations. The most recent is 10/15/2020. FINDINGS: Cardiovascular: The heart is normal in size. No pericardial effusion. Stable tortuosity and calcification of the thoracic aorta and stable scattered coronary artery calcifications. Mediastinum/Nodes: No mediastinal or hilar mass or overt adenopathy. The esophagus is grossly. The left-sided Port-A-Cath is in good position, unchanged. Lungs/Pleura: Stable underlying emphysematous changes. Stable postoperative and post radiation changes involving the right hilum and right paramediastinal lung with dense radiation fibrosis associated bronchiectasis. I do not see any findings suspicious for recurrent tumor. No worrisome pulmonary nodules to suggest pulmonary metastatic disease. Small pulmonary nodules identified on more remote chest CT scans have resolved. No new pulmonary lesions. No pleural effusions or pleural lesions. Upper Abdomen: No significant upper abdominal findings. No obvious hepatic or adrenal gland lesions. Stable aortic calcifications and bilateral renal calculi. Musculoskeletal: No chest wall mass, supraclavicular or axillary adenopathy. The bony thorax is intact. IMPRESSION: 1. Stable postoperative and post radiation changes involving the right hilum and right paramediastinal lung. No findings suspicious for recurrent tumor. 2. No mediastinal or hilar mass or adenopathy. 3. Stable  emphysematous changes. 4. Stable atherosclerotic calcifications involving the aorta and coronary arteries. 5. Emphysema and aortic atherosclerosis. 6. Small pulmonary nodules identified on more remote chest CT scans have resolved. No new or worrisome pulmonary lesions. Aortic Atherosclerosis (ICD10-I70.0) and Emphysema (ICD10-J43.9). Electronically Signed   By: Marijo Sanes M.D.   On: 12/16/2020 11:44   US Venous Img Lower Unilateral Left  Result Date: 12/30/2020 CLINICAL DATA:  Left leg swelling EXAM: LEFT LOWER EXTREMITY VENOUS DOPPLER ULTRASOUND TECHNIQUE: Gray-scale sonography with graded compression, as well as color Doppler and duplex ultrasound were performed to evaluate the lower extremity deep venous systems from the level of the common femoral vein and including the common femoral, femoral, profunda femoral, popliteal and calf veins including the posterior tibial, peroneal and gastrocnemius veins when visible. The superficial great saphenous vein was also interrogated. Spectral Doppler was utilized to evaluate flow at rest and with distal augmentation maneuvers in the common femoral, femoral and popliteal veins. COMPARISON:  None. FINDINGS: Contralateral Common Femoral Vein: Respiratory phasicity is normal and symmetric with the symptomatic side. No evidence of thrombus. Normal compressibility. Common Femoral Vein: Thrombus is noted with decreased compressibility. The thrombus is occlusive in nature. Saphenofemoral Junction: No evidence of thrombus. Normal compressibility and flow on color Doppler imaging. Profunda Femoral Vein: Thrombus is noted with decreased compressibility. Femoral Vein: Thrombus is noted with decreased compressibility. Popliteal Vein: Thrombus is noted with decreased compressibility. Calf Veins: Thrombus is noted with decreased compressibility. Superficial Great Saphenous Vein: No evidence of thrombus. Normal compressibility. Venous Reflux:  None. Other Findings:  None.  IMPRESSION: Extensive deep venous thrombosis is noted from the calf veins to the common femoral vein on the left. Electronically Signed   By: Inez Catalina M.D.   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  1.  Left lower extremity deep vein thrombosis: Patient was found to have an extensive left lower extremity DVT.  Even though on physical exam, the patient has mild edema he is tender to palpation and complaining of progressively worsening discomfort this is what prompted him to seek medical attention in the first place.  In the setting of such an extensive DVT with progressively worsening pain even after the initiation of heparin would recommend he undergo a venous thrombectomy/thrombolysis.  Procedure, risks and benefits were explained to the patient.  All questions were answered.  The patient wishes to proceed.  2.  COPD: Seems to be controlled at this time. At baseline.  3.  Lung cancer: Patient is currently under the care of Dr. Tasia Catchings Currently undergoing chemotherapy  Discussed with Dr. Mayme Genta, PA-C  12/31/2020 11:40 AM  This note was created with Dragon medical transcription system.  Any error is purely unintentional.

## 2021-01-01 ENCOUNTER — Inpatient Hospital Stay
Admit: 2021-01-01 | Discharge: 2021-01-01 | Disposition: A | Discharge status: Home / Self Care | Payer: 59 | Attending: Internal Medicine | Admitting: Internal Medicine

## 2021-01-01 DIAGNOSIS — E876 Hypokalemia: Secondary | ICD-10-CM

## 2021-01-01 DIAGNOSIS — I472 Ventricular tachycardia: Secondary | ICD-10-CM

## 2021-01-01 DIAGNOSIS — R000 Tachycardia, unspecified: Secondary | ICD-10-CM

## 2021-01-01 LAB — CBC
HCT: 33.6 % — ABNORMAL LOW (ref 39.0–52.0)
Hemoglobin: 11.9 g/dL — ABNORMAL LOW (ref 13.0–17.0)
MCH: 37.5 pg — ABNORMAL HIGH (ref 26.0–34.0)
MCHC: 35.4 g/dL (ref 30.0–36.0)
MCV: 106 fL — ABNORMAL HIGH (ref 80.0–100.0)
Platelets: 107 10*3/uL — ABNORMAL LOW (ref 150–400)
RBC: 3.17 MIL/uL — ABNORMAL LOW (ref 4.22–5.81)
RDW: 11.8 % (ref 11.5–15.5)
WBC: 11.1 10*3/uL — ABNORMAL HIGH (ref 4.0–10.5)
nRBC: 0 % (ref 0.0–0.2)

## 2021-01-01 LAB — HEPARIN LEVEL (UNFRACTIONATED): Heparin Unfractionated: 0.46 IU/mL (ref 0.30–0.70)

## 2021-01-01 LAB — BASIC METABOLIC PANEL
Anion gap: 8 (ref 5–15)
BUN: 14 mg/dL (ref 6–20)
CO2: 25 mmol/L (ref 22–32)
Calcium: 8.8 mg/dL — ABNORMAL LOW (ref 8.9–10.3)
Chloride: 101 mmol/L (ref 98–111)
Creatinine, Ser: 0.91 mg/dL (ref 0.61–1.24)
GFR, Estimated: >60 mL/min (ref >60)
Glucose, Bld: 94 mg/dL (ref 70–99)
Potassium: 3.6 mmol/L (ref 3.5–5.1)
Sodium: 134 mmol/L — ABNORMAL LOW (ref 135–145)

## 2021-01-01 MED ORDER — APIXABAN 5 MG PO TABS: 5.0000 mg | ORAL_TABLET | Freq: Two times a day (BID) | ORAL | Status: DC

## 2021-01-01 MED ORDER — APIXABAN 5 MG PO TABS: ORAL_TABLET | ORAL | 0 refills | Status: DC

## 2021-01-01 MED ORDER — METOPROLOL SUCCINATE ER 25 MG PO TB24: 25.0000 mg | ORAL_TABLET | Freq: Every day | ORAL | 0 refills | Status: DC

## 2021-01-01 MED ORDER — POTASSIUM CHLORIDE CRYS ER 20 MEQ PO TBCR
40.0000 meq | EXTENDED_RELEASE_TABLET | Freq: Once | ORAL | Status: AC
  Administered 2021-01-01: 40 meq via ORAL
  Filled 2021-01-01: qty 2

## 2021-01-01 MED ORDER — APIXABAN 5 MG PO TABS
10.0000 mg | ORAL_TABLET | Freq: Two times a day (BID) | ORAL | Status: DC
  Administered 2021-01-01 – 2021-01-02 (×3): 10 mg via ORAL
  Filled 2021-01-01 (×3): qty 2

## 2021-01-01 NOTE — Consult Note (Signed)
[facility] Cardiology  CARDIOLOGY CONSULT NOTE  Patient ID: Samuel Choi MRN: [id number] DOB/AGE: Dec 18, 1965 55 y.o.  Admit date: 12/30/2020 Referring Physician [place] Primary Physician [facility] Primary Cardiologist Fath Reason for Consultation wide-complex tachycardia  HPI: 55 year old gentleman referred for evaluation of wide-complex tachycardia.  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  She was admitted on 12/30/2020 with acute left lower extremity DVT and underwent catheter directed thrombolysis and thrombectomy to left popliteal, femoral, common femoral and external iliac vein.  Patient was noted to have brief episodes of wide-complex tachycardia on telemetry.  ECG revealed sinus tachycardia, with LVH, nonspecific intraventricular conduction delay, and 4 beat run of wide-complex tachycardia, precipitated by premature atrial contraction, without change in axis.  Patient is asymptomatic, denies chest pain, palpitations or heart racing.  He is recent been followed by Dr. Ubaldo Glassing, noted to have inappropriate sinus tachycardia, on Cardizem CD as outpatient.  Patient given dose of metoprolol succinate, magnesium, and restarted on home dose of Cardizem CD.  Review of systems complete and found to be negative unless listed above     Past Medical History:  Diagnosis Date   Alcohol abuse    usually drinks 2-3 drinks per day   Atherosclerosis 06/2018   Chronic sinusitis    Dehydration 02/07/2019   Emphysema of lung ([place]) 06/2018   patient unaware of this.   Hip fracture ([place]) 05/2018   no surgery   History of kidney stones 05/2018   per xray, bilateral nephrolitiasis   Hypertension    Squamous cell carcinoma of lung, right ([place]) 06/2018    Past Surgical History:  Procedure Laterality Date   BRAIN SURGERY  10/2017   nasal/sinus endoscopy. mass benign   ELECTROMAGNETIC NAVIGATION BROCHOSCOPY Right 06/25/2018   Procedure: ELECTROMAGNETIC NAVIGATION  BRONCHOSCOPY;  Surgeon: [name], MD;  Location: [facility] ORS;  Service: Cardiopulmonary;  Laterality: Right;   ENDOBRONCHIAL ULTRASOUND Right 06/25/2018   Procedure: ENDOBRONCHIAL ULTRASOUND;  Surgeon: [name], MD;  Location: [facility] ORS;  Service: Cardiopulmonary;  Laterality: Right;   ENDOBRONCHIAL ULTRASOUND Right 12/26/2018   Procedure: ENDOBRONCHIAL ULTRASOUND RIGHT;  Surgeon: [name], MD;  Location: [facility] ORS;  Service: Cardiopulmonary;  Laterality: Right;   FLEXIBLE BRONCHOSCOPY N/A 08/20/2018   Procedure: FLEXIBLE BRONCHOSCOPY PREOP;  Surgeon: [name], MD;  Location: [facility] ORS;  Service: Thoracic;  Laterality: N/A;   IR CV LINE INJECTION  04/04/2019   NASAL SINUS SURGERY  10/2017   At [facility], frontal sinusotomy, ethmoidectomy, resection anterior cranial fossa neoplasm, turbinate resection   PORTACATH PLACEMENT Left 01/15/2019   Procedure: INSERTION PORT-A-CATH;  Surgeon: [name], MD;  Location: [facility] ORS;  Service: General;  Laterality: Left;   THORACOTOMY Right 08/13/2018   Procedure: PREOP BROCHOSCOPY WITH RIGHT THORACOTOMY AND RUL RESECTION;  Surgeon: [name], MD;  Location: [facility] ORS;  Service: General;  Laterality: Right;   THORACOTOMY Right 08/20/2018   Procedure: THORACOTOMY MAJOR RIGHT UPPER LOBE LOBECTOMY;  Surgeon: [name], MD;  Location: [facility] ORS;  Service: Thoracic;  Laterality: Right;   TOE SURGERY Left    pin in left toe    Medications Prior to Admission  Medication Sig Dispense Refill Last Dose   albuterol (VENTOLIN HFA) 108 (90 Base) MCG/ACT inhaler TAKE 2 PUFFS BY MOUTH EVERY 6 HOURS AS NEEDED FOR WHEEZE OR SHORTNESS OF BREATH 6.7 each 6    cloNIDine (CATAPRES) 0.1 MG tablet Take 0.1 mg by mouth daily.   5    diltiazem (CARDIZEM CD)  120 MG 24 hr capsule Take 120 mg by mouth daily.      folic acid (V-R FOLIC ACID) 106 MCG tablet Take 1 tablet (400 mcg total) by mouth daily. 90 tablet 1    hydrALAZINE (APRESOLINE) 100 MG tablet Take 50 mg by mouth 2  (two) times daily.       hydrochlorothiazide (HYDRODIURIL) 12.5 MG tablet Take 12.5 mg by mouth daily.       ibuprofen (ADVIL) 600 MG tablet Take 600 mg by mouth every 6 (six) hours.      KLOR-CON M20 20 MEQ tablet Take 2 tablets (40 mEq total) by mouth daily. 180 tablet 1    latanoprost (XALATAN) 0.005 % ophthalmic solution 1 drop at bedtime.      lidocaine-prilocaine (EMLA) cream Apply to affected area once 30 g 3    loratadine (CLARITIN) 10 MG tablet Take 10 mg by mouth daily.      magnesium chloride (SLOW-MAG) 64 MG TBEC SR tablet Take 1 tablet (64 mg total) by mouth 2 (two) times daily. TAKE 1 TABLET (64 MG TOTAL) BY MOUTH DAILY. 60 tablet 1    methocarbamol (ROBAXIN) 500 MG tablet Take 1 tablet (500 mg total) by mouth at bedtime. 30 tablet 0    nystatin (MYCOSTATIN) 100000 UNIT/ML suspension Take 5 mLs (500,000 Units total) by mouth 4 (four) times daily. Swish and spit. 473 mL 0    olmesartan (BENICAR) 40 MG tablet Take 40 mg by mouth every other day.       ondansetron (ZOFRAN) 8 MG tablet Take 1 tablet (8 mg total) by mouth every 8 (eight) hours as needed for refractory nausea / vomiting. Start on day 3 after chemo. 90 tablet 3    prochlorperazine (COMPAZINE) 10 MG tablet Take 1 tablet (10 mg total) by mouth every 6 (six) hours as needed (Nausea or vomiting). 90 tablet 3    SPIRIVA HANDIHALER 18 MCG inhalation capsule INHALE 1 CAPSULE VIA HANDIHALER ONCE DAILY AT THE SAME TIME EVERY DAY 30 capsule 0    SYMBICORT 80-4.5 MCG/ACT inhaler INHALE 2 PUFFS INTO THE LUNGS IN THE MORNING AND AT BEDTIME. 10.2 each 6    vitamin B-12 (CYANOCOBALAMIN) 1000 MCG tablet Take 1 tablet (1,000 mcg total) by mouth daily. 90 tablet 1    Social History   Socioeconomic History   Marital status: Married    Spouse name: lisa   Number of children: Not on file   Years of education: Not on file   Highest education level: Not on file  Occupational History   Occupation: welding    Comment: taking time off to  resolve issues  Tobacco Use   Smoking status: Former    Packs/day: 0.50    Years: 15.00    Pack years: 7.50    Types: Cigarettes    Quit date: 08/2018    Years since quitting: 2.3   Smokeless tobacco: Never  Vaping Use   Vaping Use: Never used  Substance and Sexual Activity   Alcohol use: Yes    Alcohol/week: 3.0 standard drinks    Types: 3 Cans of beer per week    Comment: usually 2 drinks per week, per patient   Drug use: No   Sexual activity: Not on file  Other Topics Concern   Not on file  Social History Narrative   Not on file   Social Determinants of Health   Financial Resource Strain: Not on file  Food Insecurity: Not on file  Transportation Needs:  Not on file  Physical Activity: Not on file  Stress: Not on file  Social Connections: Not on file  Intimate Partner Violence: Not on file    Family History  Problem Relation Age of Onset   Breast cancer Mother    Diabetes Mother    Lung cancer Father    Hypertension Father       Review of systems complete and found to be negative unless listed above      PHYSICAL EXAM  General: Well developed, well nourished, in no acute distress HEENT:  Normocephalic and atramatic Neck:  No JVD.  Lungs: Clear bilaterally to auscultation and percussion. Heart: HRRR . Normal S1 and S2 without gallops or murmurs.  Abdomen: Bowel sounds are positive, abdomen soft and non-tender  Msk:  Back normal, normal gait. Normal strength and tone for age. Extremities: No clubbing, cyanosis or edema.   Neuro: Alert and oriented X 3. Psych:  Good affect, responds appropriately  Labs:   Lab Results  Component Value Date   WBC 11.1 (H) 01/01/2021   HGB 11.9 (L) 01/01/2021   HCT 33.6 (L) 01/01/2021   MCV 106.0 (H) 01/01/2021   PLT 107 (L) 01/01/2021    Recent Labs  Lab 12/31/20 0514 01/01/21 0450  NA 137 134*  K 4.1 3.6  CL 102 101  CO2 24 25  BUN 12 14  CREATININE 0.91 0.91  CALCIUM 9.2 8.8*  PROT 7.7  --   BILITOT 0.9   --   ALKPHOS 91  --   ALT 16  --   AST 21  --   GLUCOSE 100* 94   Lab Results  Component Value Date   CKTOTAL 126 07/04/2014   CKMB 1.6 07/04/2014   TROPONINI 0.21 (H) 07/04/2014   No results found for: CHOL No results found for: HDL No results found for: LDLCALC No results found for: TRIG No results found for: CHOLHDL No results found for: LDLDIRECT    Radiology: CT Chest Wo Contrast  Result Date: 12/16/2020 CLINICAL DATA:  Lung cancer surveillance. EXAM: CT CHEST WITHOUT CONTRAST TECHNIQUE: Multidetector CT imaging of the chest was performed following the standard protocol without IV contrast. COMPARISON:  Multiple prior examinations. The most recent is 10/15/2020. FINDINGS: Cardiovascular: The heart is normal in size. No pericardial effusion. Stable tortuosity and calcification of the thoracic aorta and stable scattered coronary artery calcifications. Mediastinum/Nodes: No mediastinal or hilar mass or overt adenopathy. The esophagus is grossly. The left-sided Port-A-Cath is in good position, unchanged. Lungs/Pleura: Stable underlying emphysematous changes. Stable postoperative and post radiation changes involving the right hilum and right paramediastinal lung with dense radiation fibrosis associated bronchiectasis. I do not see any findings suspicious for recurrent tumor. No worrisome pulmonary nodules to suggest pulmonary metastatic disease. Small pulmonary nodules identified on more remote chest CT scans have resolved. No new pulmonary lesions. No pleural effusions or pleural lesions. Upper Abdomen: No significant upper abdominal findings. No obvious hepatic or adrenal gland lesions. Stable aortic calcifications and bilateral renal calculi. Musculoskeletal: No chest wall mass, supraclavicular or axillary adenopathy. The bony thorax is intact. IMPRESSION: 1. Stable postoperative and post radiation changes involving the right hilum and right paramediastinal lung. No findings suspicious for  recurrent tumor. 2. No mediastinal or hilar mass or adenopathy. 3. Stable emphysematous changes. 4. Stable atherosclerotic calcifications involving the aorta and coronary arteries. 5. Emphysema and aortic atherosclerosis. 6. Small pulmonary nodules identified on more remote chest CT scans have resolved. No new or worrisome pulmonary lesions.  Aortic Atherosclerosis (ICD10-I70.0) and Emphysema (ICD10-J43.9). Electronically Signed   By: Marijo Sanes M.D.   On: 12/16/2020 11:44   PERIPHERAL VASCULAR CATHETERIZATION  Result Date: 12/31/2020 See surgical note for result.  US Venous Img Lower Unilateral Left  Result Date: 12/30/2020 CLINICAL DATA:  Left leg swelling EXAM: LEFT LOWER EXTREMITY VENOUS DOPPLER ULTRASOUND TECHNIQUE: Gray-scale sonography with graded compression, as well as color Doppler and duplex ultrasound were performed to evaluate the lower extremity deep venous systems from the level of the common femoral vein and including the common femoral, femoral, profunda femoral, popliteal and calf veins including the posterior tibial, peroneal and gastrocnemius veins when visible. The superficial great saphenous vein was also interrogated. Spectral Doppler was utilized to evaluate flow at rest and with distal augmentation maneuvers in the common femoral, femoral and popliteal veins. COMPARISON:  None. FINDINGS: Contralateral Common Femoral Vein: Respiratory phasicity is normal and symmetric with the symptomatic side. No evidence of thrombus. Normal compressibility. Common Femoral Vein: Thrombus is noted with decreased compressibility. The thrombus is occlusive in nature. Saphenofemoral Junction: No evidence of thrombus. Normal compressibility and flow on color Doppler imaging. Profunda Femoral Vein: Thrombus is noted with decreased compressibility. Femoral Vein: Thrombus is noted with decreased compressibility. Popliteal Vein: Thrombus is noted with decreased compressibility. Calf Veins: Thrombus is noted  with decreased compressibility. Superficial Great Saphenous Vein: No evidence of thrombus. Normal compressibility. Venous Reflux:  None. Other Findings:  None. IMPRESSION: Extensive deep venous thrombosis is noted from the calf veins to the common femoral vein on the left. Electronically Signed   By: Inez Catalina M.D.   On: 12/30/2020 21:20    EKG: Sinus tachycardia, LVH, nonspecific IVCD, brief 4 beat run SVT with aberrancy  ASSESSMENT AND PLAN:   1.  Nonsustained, brief, wide-complex tachycardia, likely SVT with aberrancy, not ventricular tachycardia, asymptomatic 2.  Inappropriate sinus tachycardia 3.  Status post left lower extremity DVT thrombolysis and thrombectomy 4.  Stage IV squamous cell carcinoma of the right lung  Recommendations  1.  Agree with overall current therapy 2.  Continue Cardizem CD 3.  Continue metoprolol succinate 25 mg daily 4.  Review 2D echocardiogram  Signed: Isaias Cowman MD,PhD, [facility] 01/01/2021, 8:59 AM

## 2021-01-01 NOTE — Progress Notes (Signed)
*  PRELIMINARY RESULTS* Echocardiogram 2D Echocardiogram has been performed.  Jonette Mate Keelan [place] 01/01/2021, 5:19 PM

## 2021-01-01 NOTE — Progress Notes (Signed)
    Subjective  - POD #1, status post left leg venous thrombectomy and angioplasty  The patient states that his leg feels better today   Physical Exam:  Compression wrap in place.  No significant swelling.        Assessment/Plan:  POD #1  Stable from a vascular perspective, following venous thrombectomy.  He can be transition to oral anticoagulation, and be discharged once his medical issues are resolved.  We will see him back in the office for follow-up.  Wells Mica Releford 01/01/2021 4:53 PM --  Vitals:   01/01/21 0805 01/01/21 1113  BP: 132/78 125/73  Pulse: 87 98  Resp: 17 17  Temp: 98.5 F (36.9 C) 98.2 F (36.8 C)  SpO2: 97% 98%    Intake/Output Summary (Last 24 hours) at 01/01/2021 1653 Last data filed at 01/01/2021 1359 Gross per 24 hour  Intake 720 ml  Output 450 ml  Net 270 ml     Laboratory CBC    Component Value Date/Time   WBC 11.1 (H) 01/01/2021 0450   HGB 11.9 (L) 01/01/2021 0450   HGB 15.9 11/02/2014 1859   HCT 33.6 (L) 01/01/2021 0450   HCT 46.0 11/02/2014 1859   PLT 107 (L) 01/01/2021 0450   PLT 143 (L) 11/02/2014 1859    BMET    Component Value Date/Time   NA 134 (L) 01/01/2021 0450   NA 137 11/02/2014 1859   K 3.6 01/01/2021 0450   K 3.5 11/02/2014 1859   CL 101 01/01/2021 0450   CL 101 11/02/2014 1859   CO2 25 01/01/2021 0450   CO2 26 11/02/2014 1859   GLUCOSE 94 01/01/2021 0450   GLUCOSE 100 (H) 11/02/2014 1859   BUN 14 01/01/2021 0450   BUN 11 11/02/2014 1859   CREATININE 0.91 01/01/2021 0450   CREATININE 1.06 11/02/2014 1859   CALCIUM 8.8 (L) 01/01/2021 0450   CALCIUM 8.9 11/02/2014 1859   GFRNONAA >60 01/01/2021 0450   GFRNONAA >60 11/02/2014 1859   GFRAA >60 04/07/2020 0854   GFRAA >60 11/02/2014 1859    COAG Lab Results  Component Value Date   INR 1.0 12/30/2020   INR 1.0 01/15/2019   INR 0.97 08/19/2018   No results found for: PTT  Antibiotics Anti-infectives (From admission, onward)    Start      Dose/Rate Route Frequency Ordered Stop   01/01/21 0000  ceFAZolin (ANCEF) IVPB 2g/100 mL premix       Note to Pharmacy: To be given in specials   2 g 200 mL/hr over 30 Minutes Intravenous  Once 12/31/20 1320 12/31/20 1524        V. Leia Alf, M.D., [facility] [place] Office: [phone] Pager:  [phone]

## 2021-01-01 NOTE — TOC Initial Note (Signed)
Transition of Care [facility]) - Initial/Assessment Note    Patient Details  Name: Samuel Choi MRN: [id number] Date of Birth: 07/14/1965  [facility] of [facility]) CM/SW Contact:    Shelbie Hutching, RN Phone Number: 01/01/2021, 3:20 PM  Clinical Narrative:                 Patient admitted to the hospital for acute DVT.  Patient will be going home on Eliquis.  TOC has provided patient with a $10 copay card for his Eliquis, explained that he will need to call and activate the card.  Patient is from home with his wife and has no other discharge needs.   Expected Discharge Plan: Home/Self Care Barriers to Discharge: Continued Medical Work up   Patient Goals and CMS Choice Patient states their goals for this hospitalization and ongoing recovery are:: want to get home      Expected Discharge Plan and Services Expected Discharge Plan: Home/Self Care   Discharge Planning Services: CM Consult, Medication Assistance   Living arrangements for the past 2 months: Single Family Home                 DME Arranged: N/A DME Agency: NA       HH Arranged: NA          Prior Living Arrangements/Services Living arrangements for the past 2 months: Single Family Home Lives with:: Spouse Patient language and need for interpreter reviewed:: Yes Do you feel safe going back to the place where you live?: Yes      Need for Family Participation in Patient Care: Yes (Comment) Care giver support system in place?: Yes (comment) (wife)   Criminal Activity/Legal Involvement Pertinent to Current Situation/Hospitalization: No - Comment as needed  Activities of Daily Living Home Assistive Devices/Equipment: None ADL Screening (condition at time of admission) Patient's cognitive ability adequate to safely complete daily activities?: Yes Is the patient deaf or have difficulty hearing?: No Does the patient have difficulty seeing, even when wearing glasses/contacts?: No Does the patient have difficulty  concentrating, remembering, or making decisions?: No Patient able to express need for assistance with ADLs?: Yes Does the patient have difficulty dressing or bathing?: No Independently performs ADLs?: Yes (appropriate for developmental age) Does the patient have difficulty walking or climbing stairs?: No Weakness of Legs: Left Weakness of Arms/Hands: None  Permission Sought/Granted Permission sought to share information with : Case Manager, Family Supports Permission granted to share information with : Yes, Verbal Permission Granted  Share Information with NAME: Lattie Haw     Permission granted to share info w Relationship: wife     Emotional Assessment   Attitude/Demeanor/Rapport: Engaged Affect (typically observed): Accepting Orientation: : Oriented to Self, Oriented to Place, Oriented to  Time, Oriented to Situation Alcohol / Substance Use: Not Applicable Psych Involvement: No (comment)  Admission diagnosis:  Left leg swelling [M79.89] Acute deep vein thrombosis (DVT) (HCC) [I82.409] Acute deep vein thrombosis (DVT) of left lower extremity, unspecified vein (HCC) [I82.402] Patient Active Problem List   Diagnosis Date Noted   Acute deep vein thrombosis (DVT) ([place]) 12/30/2020   Chemotherapy induced neutropenia ([place]) 11/09/2020   COPD exacerbation ([place]) 04/22/2020   Cough 03/31/2020   Sinus tachycardia 12/18/2019   Hypomagnesemia 12/10/2019   Encounter for antineoplastic immunotherapy 11/24/2019   Ex-smoker 07/23/2019   Palpitations 06/13/2019   Port-A-Cath in place 04/04/2019   Thrombocytopenia ([place]) 04/04/2019   Folate deficiency 04/04/2019   Hypokalemia 04/04/2019   B12 deficiency 04/04/2019  Alcohol abuse 04/04/2019   Dehydration 02/07/2019   Encounter for antineoplastic chemotherapy 01/31/2019   Squamous cell carcinoma of right lung ([place]) 09/03/2018   Goals of care, counseling/discussion 09/03/2018   Malnutrition of moderate degree 08/23/2018   Lung mass 08/13/2018    Adenopathy    Emphysema of lung ([place]) 06/2018   Allergic rhinitis 06/2018   H/O nasal polyp 02/20/2018   Acute sinusitis 10/10/2017   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/10/2017   Chronic rhinitis 10/10/2017   Mass of sinus 10/10/2017   Essential hypertension 09/27/2017   Tobacco abuse 09/27/2017   PCP:  Tracie Harrier, MD Pharmacy:   [id number] [address], [place] [address] Phone: [phone] Fax: [phone]     Social Determinants of Health (SDOH) Interventions    Readmission Risk Interventions No flowsheet data found.

## 2021-01-01 NOTE — Progress Notes (Signed)
ANTICOAGULATION CONSULT NOTE - Initial Consult  Pharmacy Consult for Heparin  Indication: DVT  No Known Allergies  Patient Measurements: Height: 6\' 1"  (185.4 cm) Weight: 56.5 kg (124 lb 9 oz) IBW/kg (Calculated) : 79.9 Heparin Dosing Weight: 64 kg   Vital Signs: Temp: 99 F (37.2 C) (06/25 0427) Temp Source: Oral (06/24 2330) BP: 134/81 (06/25 0427) Pulse Rate: 90 (06/25 0427)  Labs: Recent Labs    12/30/20 1030 12/30/20 1030 12/30/20 2220 12/31/20 0514 12/31/20 1047 12/31/20 2211 01/01/21 0450  HGB 16.2  --   --  14.7  --   --  11.9*  HCT 45.2  --   --  40.4  --   --  33.6*  PLT 146*  --   --  142*  --   --  107*  APTT  --   --  32  --   --   --   --   LABPROT  --   --  12.8  --   --   --   --   INR  --   --  1.0  --   --   --   --   HEPARINUNFRC  --    < >  --  0.44 0.44 0.26* 0.46  CREATININE 0.97  --   --  0.91  --   --  0.91   < > = values in this interval not displayed.     Estimated Creatinine Clearance: 73.3 mL/min (by C-G formula based on SCr of 0.91 mg/dL).   Medical History: Past Medical History:  Diagnosis Date   Alcohol abuse    usually drinks 2-3 drinks per day   Atherosclerosis 06/2018   Chronic sinusitis    Dehydration 02/07/2019   Emphysema of lung ([place]) 06/2018   patient unaware of this.   Hip fracture ([place]) 05/2018   no surgery   History of kidney stones 05/2018   per xray, bilateral nephrolitiasis   Hypertension    Squamous cell carcinoma of lung, right ([place]) 06/2018    Medications:  Medications Prior to Admission  Medication Sig Dispense Refill Last Dose   albuterol (VENTOLIN HFA) 108 (90 Base) MCG/ACT inhaler TAKE 2 PUFFS BY MOUTH EVERY 6 HOURS AS NEEDED FOR WHEEZE OR SHORTNESS OF BREATH 6.7 each 6    cloNIDine (CATAPRES) 0.1 MG tablet Take 0.1 mg by mouth daily.   5    diltiazem (CARDIZEM CD) 120 MG 24 hr capsule Take 120 mg by mouth daily.      folic acid (V-R FOLIC ACID) 132 MCG tablet Take 1 tablet (400 mcg total) by mouth  daily. 90 tablet 1    hydrALAZINE (APRESOLINE) 100 MG tablet Take 50 mg by mouth 2 (two) times daily.       hydrochlorothiazide (HYDRODIURIL) 12.5 MG tablet Take 12.5 mg by mouth daily.       ibuprofen (ADVIL) 600 MG tablet Take 600 mg by mouth every 6 (six) hours.      KLOR-CON M20 20 MEQ tablet Take 2 tablets (40 mEq total) by mouth daily. 180 tablet 1    latanoprost (XALATAN) 0.005 % ophthalmic solution 1 drop at bedtime.      lidocaine-prilocaine (EMLA) cream Apply to affected area once 30 g 3    loratadine (CLARITIN) 10 MG tablet Take 10 mg by mouth daily.      magnesium chloride (SLOW-MAG) 64 MG TBEC SR tablet Take 1 tablet (64 mg total) by mouth 2 (two) times daily. TAKE  1 TABLET (64 MG TOTAL) BY MOUTH DAILY. 60 tablet 1    methocarbamol (ROBAXIN) 500 MG tablet Take 1 tablet (500 mg total) by mouth at bedtime. 30 tablet 0    nystatin (MYCOSTATIN) 100000 UNIT/ML suspension Take 5 mLs (500,000 Units total) by mouth 4 (four) times daily. Swish and spit. 473 mL 0    olmesartan (BENICAR) 40 MG tablet Take 40 mg by mouth every other day.       ondansetron (ZOFRAN) 8 MG tablet Take 1 tablet (8 mg total) by mouth every 8 (eight) hours as needed for refractory nausea / vomiting. Start on day 3 after chemo. 90 tablet 3    prochlorperazine (COMPAZINE) 10 MG tablet Take 1 tablet (10 mg total) by mouth every 6 (six) hours as needed (Nausea or vomiting). 90 tablet 3    SPIRIVA HANDIHALER 18 MCG inhalation capsule INHALE 1 CAPSULE VIA HANDIHALER ONCE DAILY AT THE SAME TIME EVERY DAY 30 capsule 0    SYMBICORT 80-4.5 MCG/ACT inhaler INHALE 2 PUFFS INTO THE LUNGS IN THE MORNING AND AT BEDTIME. 10.2 each 6    vitamin B-12 (CYANOCOBALAMIN) 1000 MCG tablet Take 1 tablet (1,000 mcg total) by mouth daily. 90 tablet 1     Assessment: 55 y.o. 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  Venus US showed extensive left lowe extremity DVT. Pharmacy has been consulted for heparin dosing.  No PTA anticoagulation or APT Heparin Dosing Weight: 64 kg  Hgb: 14.7 and plts: 142  Date Time HL Rate/comment 6/24 0514 0.44 1150 units/hr  6/24 1047 0.44 1150 units/hr, therapeutic x2 6/24     2211    0.26     1150 units/hr 6/25     0450    0.46     1250 units/hr, therapeutic X 1  Goal of Therapy:  Heparin level 0.3-0.7 units/ml Monitor platelets by anticoagulation protocol: Yes   Plan:  6/25:  HL @ 0450 = 0.46, therapeutic X 1  Will continue pt on current rate and recheck HL 6 hrs after rate change.   Marianna Cid D, PharmD 01/01/2021,6:46 AM

## 2021-01-01 NOTE — Progress Notes (Signed)
[place] for Apixaban Indication:  Extensive DVT  No Known Allergies  Patient Measurements: Height: 6\' 1"  (185.4 cm) Weight: 56.5 kg (124 lb 9 oz) IBW/kg (Calculated) : 79.9  Vital Signs: Temp: 98.5 F (36.9 C) (06/25 0805) Temp Source: Oral (06/24 2330) BP: 132/78 (06/25 0805) Pulse Rate: 87 (06/25 0805)  Labs: Recent Labs    12/30/20 1030 12/30/20 1030 12/30/20 2220 12/31/20 0514 12/31/20 1047 12/31/20 2211 01/01/21 0450  HGB 16.2  --   --  14.7  --   --  11.9*  HCT 45.2  --   --  40.4  --   --  33.6*  PLT 146*  --   --  142*  --   --  107*  APTT  --   --  32  --   --   --   --   LABPROT  --   --  12.8  --   --   --   --   INR  --   --  1.0  --   --   --   --   HEPARINUNFRC  --    < >  --  0.44 0.44 0.26* 0.46  CREATININE 0.97  --   --  0.91  --   --  0.91   < > = values in this interval not displayed.    Estimated Creatinine Clearance: 73.3 mL/min (by C-G formula based on SCr of 0.91 mg/dL).   Medical History: Past Medical History:  Diagnosis Date   Alcohol abuse    usually drinks 2-3 drinks per day   Atherosclerosis 06/2018   Chronic sinusitis    Dehydration 02/07/2019   Emphysema of lung ([place]) 06/2018   patient unaware of this.   Hip fracture ([place]) 05/2018   no surgery   History of kidney stones 05/2018   per xray, bilateral nephrolitiasis   Hypertension    Squamous cell carcinoma of lung, right ([place]) 06/2018    Medications:  No PTA anticoagulation  Assessment: 55 y.o. 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 Venus US showed extensive left lowe extremity DVT. Pt was previously on heparin infusion, pharmacy has been consulted for apixaban dosing.   Goal  of Therapy:  Monitor platelets by anticoagulation protocol: Yes   Plan:  Switch heparin infusion to apixaban 10 mg BID for 7 days, followed by 5 mg BID   Sherilyn Banker, PharmD 01/01/2021,10:05 AM

## 2021-01-01 NOTE — Progress Notes (Signed)
Patient ID: Samuel Choi, male   DOB: Dec 04, 1965, 55 y.o.   MRN: [id number] [facility] PROGRESS NOTE  BRADRICK KAMAU FVC:[id number] DOB: 1965-08-23 DOA: 12/30/2020 PCP: Tracie Harrier, MD  HPI/Subjective: Patient seen this morning and again this afternoon.  Echocardiogram not done yet.  Patient did have some throbbing this morning on his left leg.  No complaints of shortness of breath.  No complaints of chest pain.  Objective: Vitals:   01/01/21 0805 01/01/21 1113  BP: 132/78 125/73  Pulse: 87 98  Resp: 17 17  Temp: 98.5 F (36.9 C) 98.2 F (36.8 C)  SpO2: 97% 98%    Intake/Output Summary (Last 24 hours) at 01/01/2021 1536 Last data filed at 01/01/2021 1359 Gross per 24 hour  Intake 720 ml  Output 450 ml  Net 270 ml   Filed Weights   12/31/20 0214 12/31/20 1307 01/01/21 0500  Weight: 58.9 kg 62.6 kg 56.5 kg    ROS: Review of Systems  Respiratory:  Negative for shortness of breath.   Cardiovascular:  Negative for chest pain.  Gastrointestinal:  Negative for abdominal pain, nausea and vomiting.  Musculoskeletal:  Positive for joint pain.  Exam: Physical Exam HENT:     Head: Normocephalic.     Mouth/Throat:     Pharynx: No oropharyngeal exudate.  Eyes:     General: Lids are normal.     Conjunctiva/sclera: Conjunctivae normal.  Cardiovascular:     Rate and Rhythm: Normal rate and regular rhythm.     Heart sounds: Normal heart sounds, S1 normal and S2 normal.  Pulmonary:     Breath sounds: No decreased breath sounds, wheezing, rhonchi or rales.  Abdominal:     Palpations: Abdomen is soft.     Tenderness: There is no abdominal tenderness.  Musculoskeletal:     Right lower leg: No swelling.     Left lower leg: Swelling present.     Comments: Left leg wrapped in pressure dressing.  Skin:    General: Skin is warm.     Findings: No rash.  Neurological:     Mental Status: He is alert and oriented to person, place, and time.     Data Reviewed: Basic  Metabolic Panel: Recent Labs  Lab 12/30/20 1030 12/31/20 0514 01/01/21 0450  NA 137 137 134*  K 4.2 4.1 3.6  CL 103 102 101  CO2 23 24 25   GLUCOSE 94 100* 94  BUN 15 12 14   CREATININE 0.97 0.91 0.91  CALCIUM 9.7 9.2 8.8*  MG 1.6* 2.2  --   PHOS  --  3.2  --    Liver Function Tests: Recent Labs  Lab 12/31/20 0514  AST 21  ALT 16  ALKPHOS 91  BILITOT 0.9  PROT 7.7  ALBUMIN 3.5    CBC: Recent Labs  Lab 12/30/20 1030 12/31/20 0514 01/01/21 0450  WBC 10.8* 10.3 11.1*  NEUTROABS 8.5*  --   --   HGB 16.2 14.7 11.9*  HCT 45.2 40.4 33.6*  MCV 107.4* 106.3* 106.0*  PLT 146* 142* 107*     Recent Results (from the past 240 hour(s))  Resp Panel by RT-PCR (Flu A&B, Covid) Nasopharyngeal Swab     Status: None   Collection Time: 12/30/20 10:20 PM   Specimen: Nasopharyngeal Swab; Nasopharyngeal(NP) swabs in vial transport medium  Result Value Ref Range Status   SARS Coronavirus 2 by RT PCR NEGATIVE NEGATIVE Final    Comment: (NOTE) SARS-CoV-2 target nucleic acids are NOT DETECTED.  The  SARS-CoV-2 RNA is generally detectable in upper respiratory specimens during the acute phase of infection. The lowest concentration of SARS-CoV-2 viral copies this assay can detect is 138 copies/mL. A negative result does not preclude SARS-Cov-2 infection and should not be used as the sole basis for treatment or other patient management decisions. A negative result may occur with  improper specimen collection/handling, submission of specimen other than nasopharyngeal swab, presence of viral mutation(s) within the areas targeted by this assay, and inadequate number of viral copies(<138 copies/mL). A negative result must be combined with clinical observations, patient history, and epidemiological information. The expected result is Negative.  Fact Sheet for Patients:  EntrepreneurPulse.com.au  Fact Sheet for Healthcare Providers:   IncredibleEmployment.be  This test is no t yet approved or cleared by the [place] FDA and  has been authorized for detection and/or diagnosis of SARS-CoV-2 by FDA under an Emergency Use Authorization (EUA). This EUA will remain  in effect (meaning this test can be used) for the duration of the COVID-19 declaration under Section 564(b)(1) of the Act, 21 U.S.C.section 360bbb-3(b)(1), unless the authorization is terminated  or revoked sooner.       Influenza A by PCR NEGATIVE NEGATIVE Final   Influenza B by PCR NEGATIVE NEGATIVE Final    Comment: (NOTE) The Xpert Xpress SARS-CoV-2/FLU/RSV plus assay is intended as an aid in the diagnosis of influenza from Nasopharyngeal swab specimens and should not be used as a sole basis for treatment. Nasal washings and aspirates are unacceptable for Xpert Xpress SARS-CoV-2/FLU/RSV testing.  Fact Sheet for Patients: EntrepreneurPulse.com.au  Fact Sheet for Healthcare Providers: IncredibleEmployment.be  This test is not yet approved or cleared by the [place] FDA and has been authorized for detection and/or diagnosis of SARS-CoV-2 by FDA under an Emergency Use Authorization (EUA). This EUA will remain in effect (meaning this test can be used) for the duration of the COVID-19 declaration under Section 564(b)(1) of the Act, 21 U.S.C. section 360bbb-3(b)(1), unless the authorization is terminated or revoked.  Performed at [facility], [address] [place], [place] [address]      Studies: PERIPHERAL VASCULAR CATHETERIZATION  Result Date: 12/31/2020 See surgical note for result.  US Venous Img Lower Unilateral Left  Result Date: 12/30/2020 CLINICAL DATA:  Left leg swelling EXAM: LEFT LOWER EXTREMITY VENOUS DOPPLER ULTRASOUND TECHNIQUE: Gray-scale sonography with graded compression, as well as color Doppler and duplex ultrasound were performed to evaluate the lower  extremity deep venous systems from the level of the common femoral vein and including the common femoral, femoral, profunda femoral, popliteal and calf veins including the posterior tibial, peroneal and gastrocnemius veins when visible. The superficial great saphenous vein was also interrogated. Spectral Doppler was utilized to evaluate flow at rest and with distal augmentation maneuvers in the common femoral, femoral and popliteal veins. COMPARISON:  None. FINDINGS: Contralateral Common Femoral Vein: Respiratory phasicity is normal and symmetric with the symptomatic side. No evidence of thrombus. Normal compressibility. Common Femoral Vein: Thrombus is noted with decreased compressibility. The thrombus is occlusive in nature. Saphenofemoral Junction: No evidence of thrombus. Normal compressibility and flow on color Doppler imaging. Profunda Femoral Vein: Thrombus is noted with decreased compressibility. Femoral Vein: Thrombus is noted with decreased compressibility. Popliteal Vein: Thrombus is noted with decreased compressibility. Calf Veins: Thrombus is noted with decreased compressibility. Superficial Great Saphenous Vein: No evidence of thrombus. Normal compressibility. Venous Reflux:  None. Other Findings:  None. IMPRESSION: Extensive deep venous thrombosis is noted from the calf veins  to the common femoral vein on the left. Electronically Signed   By: Inez Catalina M.D.   On: 12/30/2020 21:20    Scheduled Meds:  apixaban  10 mg Oral BID   Followed by   Derrill Memo ON 01/08/2021] apixaban  5 mg Oral BID   diltiazem  120 mg Oral Daily   feeding supplement  237 mL Oral TID BM   hydrALAZINE  50 mg Oral BID   irbesartan  300 mg Oral Q48H   loratadine  10 mg Oral Daily   methocarbamol  500 mg Oral QHS   metoprolol succinate  25 mg Oral Daily   mometasone-formoterol  2 puff Inhalation BID   tiotropium  18 mcg Inhalation Daily     Assessment/Plan:  Extensive DVT of the left lower extremity.  Vascular  surgery did thrombolysis and thrombectomy on 12/31/2020.  Dr. Trula Slade vascular surgery covering okay with switching off heparin drip and going over the Eliquis.  He will see this afternoon and make recommendations on that pressure wrap on his leg.  30-day free Eliquis card given by transitional care team. Wide-complex tachycardia.  Seen by cardiology.  Agreed with Toprol-XL and Cardizem CD.  Echocardiogram pending. Squamous cell carcinoma of the lung, recurrent on immunotherapy by Dr. Tasia Catchings Hypokalemia.  Replace potassium orally Essential hypertension.  Hold clonidine and hydrochlorothiazide.  Continue Toprol, ARB and Cardizem CD Hypomagnesemia on presentation this was replaced    Code Status:     Code Status Orders  (From admission, onward)           Start     Ordered   12/30/20 2256  Full code  Continuous        12/30/20 2256           Code Status History     Date Active Date Inactive Code Status Order ID Comments User Context   08/13/2018 1420 08/24/2018 1739 Full Code [id number]  [name], MD Inpatient      Family Communication: Spoke with the patient's wife at the bedside Disposition Plan: Status is: Inpatient  Dispo: The patient is from: Home              Anticipated d/c is to: Home but would like echocardiogram and vascular eval prior to disposition              Patient currently awaiting echocardiogram.     Difficult to place patient.  No.  Consultants: Vascular surgery Cardiology  Procedures: Left leg DVT thrombectomy and thrombolysis  Time spent: 27 minutes  [place]

## 2021-01-02 LAB — ECHOCARDIOGRAM COMPLETE
AR max vel: 2.96 cm2
AV Peak grad: 7.5 mmHg
Ao pk vel: 1.37 m/s
Area-P 1/2: 3.42 cm2
Height: 73 in
S' Lateral: 2.9 cm
Weight: 1992.96 oz

## 2021-01-02 MED ORDER — HYDROCODONE-ACETAMINOPHEN 5-325 MG PO TABS: 1.0000 | ORAL_TABLET | Freq: Four times a day (QID) | ORAL | 0 refills | Status: DC | PRN

## 2021-01-02 NOTE — Discharge Summary (Signed)
[facility] [place] NAME: Samuel Choi    MR#:  [id number]  DATE OF BIRTH:  [birth year]  DATE OF ADMISSION:  12/30/2020 ADMITTING PHYSICIAN: Rhetta Mura, DO  DATE OF DISCHARGE: 01/02/2021  1:15 PM  PRIMARY CARE PHYSICIAN: Tracie Harrier, MD    ADMISSION DIAGNOSIS:  Left leg swelling [M79.89] Acute deep vein thrombosis (DVT) (HCC) [I82.409] Acute deep vein thrombosis (DVT) of left lower extremity, unspecified vein (HCC) [I82.402]  DISCHARGE DIAGNOSIS:  Principal Problem:   Acute deep vein thrombosis (DVT) (HCC) Active Problems:   Essential hypertension   Squamous cell carcinoma of right lung (HCC)   Emphysema of lung (HCC)   Allergic rhinitis   Wide-complex tachycardia ([place])   SECONDARY DIAGNOSIS:   Past Medical History:  Diagnosis Date  . Alcohol abuse    usually drinks 2-3 drinks per day  . Atherosclerosis 06/2018  . Chronic sinusitis   . Dehydration 02/07/2019  . Emphysema of lung ([place]) 06/2018   patient unaware of this.  . Hip fracture ([place]) 05/2018   no surgery  . History of kidney stones 05/2018   per xray, bilateral nephrolitiasis  . Hypertension   . Squamous cell carcinoma of lung, right ([place]) 06/2018    HOSPITAL COURSE:   1.  Extensive DVT of the left lower extremity.  Vascular surgery did thrombolysis and thrombectomy on 12/31/2020.  Patient initially was on heparin drip and switched over to Eliquis.  30-day discount Eliquis card given by transitional care team.  Patient was having some pain down near his left ankle.  Vascular surgery adjusted the pressure wrap and pain was better.  I did prescribe a few pain pills. 2.  Wide-complex tachycardia.  Likely SVT with aberrancy.  Patient on Toprol-XL and Cardizem CD.  Echocardiogram showed a normal EF and moderate to severe tricuspid regurgitation.  Follow-up with Dr. Ubaldo Glassing as outpatient. 3.  Squamous cell carcinoma of the lung, recurrent on immunotherapy by Dr. Tasia Catchings  as outpatient. 4.  Hypokalemia replaced orally here in the hospital.  Discontinue hydrochlorothiazide 5.  Essential hypertension.  Holding clonidine and hydrochlorothiazide.  Continue Toprol, ARB and Cardizem CD 6.  Hypomagnesemia on presentation this was replaced  DISCHARGE CONDITIONS:   Satisfactory  CONSULTS OBTAINED:  Treatment Team:  Algernon Huxley, MD  DRUG ALLERGIES:  No Known Allergies  DISCHARGE MEDICATIONS:   Allergies as of 01/02/2021   No Known Allergies      Medication List     STOP taking these medications    cloNIDine 0.1 MG tablet Commonly known as: CATAPRES   hydrochlorothiazide 12.5 MG tablet Commonly known as: HYDRODIURIL   ibuprofen 600 MG tablet Commonly known as: ADVIL   Klor-Con M20 20 MEQ tablet Generic drug: potassium chloride SA   latanoprost 0.005 % ophthalmic solution Commonly known as: XALATAN   lidocaine-prilocaine cream Commonly known as: EMLA   nystatin 100000 UNIT/ML suspension Commonly known as: MYCOSTATIN   Potassium Chloride ER 20 MEQ Tbcr       TAKE these medications    albuterol 108 (90 Base) MCG/ACT inhaler Commonly known as: VENTOLIN HFA TAKE 2 PUFFS BY MOUTH EVERY 6 HOURS AS NEEDED FOR WHEEZE OR SHORTNESS OF BREATH   apixaban 5 MG Tabs tablet Commonly known as: ELIQUIS Two tabs po twice a day for seven days then one tab po twice a day afterwards   diltiazem 120 MG 24 hr capsule Commonly known as: CARDIZEM CD Take 120 mg by mouth daily.  folic acid 470 MCG tablet Commonly known as: V-R FOLIC ACID Take 1 tablet (400 mcg total) by mouth daily.   hydrALAZINE 100 MG tablet Commonly known as: APRESOLINE Take 50 mg by mouth 2 (two) times daily.   HYDROcodone-acetaminophen 5-325 MG tablet Commonly known as: NORCO/VICODIN Take 1 tablet by mouth every 6 (six) hours as needed for severe pain.   loratadine 10 MG tablet Commonly known as: CLARITIN Take 10 mg by mouth daily.   methocarbamol 500 MG  tablet Commonly known as: ROBAXIN Take 1 tablet (500 mg total) by mouth at bedtime.   metoprolol succinate 25 MG 24 hr tablet Commonly known as: TOPROL-XL Take 1 tablet (25 mg total) by mouth daily.   olmesartan 40 MG tablet Commonly known as: BENICAR Take 40 mg by mouth every other day.   ondansetron 8 MG tablet Commonly known as: Zofran Take 1 tablet (8 mg total) by mouth every 8 (eight) hours as needed for refractory nausea / vomiting. Start on day 3 after chemo.   prochlorperazine 10 MG tablet Commonly known as: COMPAZINE Take 1 tablet (10 mg total) by mouth every 6 (six) hours as needed (Nausea or vomiting).   Slow-Mag 64 MG Tbec SR tablet Generic drug: magnesium chloride Take 1 tablet (64 mg total) by mouth 2 (two) times daily. TAKE 1 TABLET (64 MG TOTAL) BY MOUTH DAILY.   Spiriva HandiHaler 18 MCG inhalation capsule Generic drug: tiotropium INHALE 1 CAPSULE VIA HANDIHALER ONCE DAILY AT THE SAME TIME EVERY DAY   Symbicort 80-4.5 MCG/ACT inhaler Generic drug: budesonide-formoterol INHALE 2 PUFFS INTO THE LUNGS IN THE MORNING AND AT BEDTIME.   vitamin B-12 1000 MCG tablet Commonly known as: CYANOCOBALAMIN Take 1 tablet (1,000 mcg total) by mouth daily.         DISCHARGE INSTRUCTIONS:   Follow-up with Dr. Tasia Catchings Follow-up with Dr. Lucky Cowboy Follow-up with your PMD  If you experience worsening of your admission symptoms, develop shortness of breath, life threatening emergency, suicidal or homicidal thoughts you must seek medical attention immediately by calling 911 or calling your MD immediately  if symptoms less severe.  You Must read complete instructions/literature along with all the possible adverse reactions/side effects for all the Medicines you take and that have been prescribed to you. Take any new Medicines after you have completely understood and accept all the possible adverse reactions/side effects.   Please note  You were cared for by a hospitalist during  your hospital stay. If you have any questions about your discharge medications or the care you received while you were in the hospital after you are discharged, you can call the unit and asked to speak with the hospitalist on call if the hospitalist that took care of you is not available. Once you are discharged, your primary care physician will handle any further medical issues. Please note that NO REFILLS for any discharge medications will be authorized once you are discharged, as it is imperative that you return to your primary care physician (or establish a relationship with a primary care physician if you do not have one) for your aftercare needs so that they can reassess your need for medications and monitor your lab values.    Today   CHIEF COMPLAINT:   Chief Complaint  Patient presents with  . Leg Swelling    HISTORY OF PRESENT ILLNESS:  Samuel Choi  is a 55 y.o. male came in with leg swelling and pain   VITAL SIGNS:  Blood pressure 133/82, pulse  96, temperature 98.5 F (36.9 C), temperature source Oral, resp. rate 16, height 6\' 1"  (1.854 m), weight 55.1 kg, SpO2 98 %.  I/O:   Intake/Output Summary (Last 24 hours) at 01/02/2021 1638 Last data filed at 01/02/2021 1026 Gross per 24 hour  Intake 360 ml  Output --  Net 360 ml    PHYSICAL EXAMINATION:  GENERAL:  55 y.o.-year-old patient lying in the bed with no acute distress.  EYES: Pupils equal, round, reactive to light and accommodation. No scleral icterus.  Left eye deviates out HEENT: Head atraumatic, normocephalic. Oropharynx and nasopharynx clear.   LUNGS: Normal breath sounds bilaterally, no wheezing, rales,rhonchi or crepitation. No use of accessory muscles of respiration.  CARDIOVASCULAR: S1, S2 normal. No murmurs, rubs, or gallops.  ABDOMEN: Soft, non-tender, non-distended. Bowel sounds present. No organomegaly or mass.  EXTREMITIES: No pedal edema, cyanosis, or clubbing.  NEUROLOGIC: Cranial nerves II through XII  are intact. Muscle strength 5/5 in all extremities. Sensation intact. Gait not checked.  PSYCHIATRIC: The patient is alert and oriented x 3.  SKIN: No obvious rash, lesion, or ulcer.   DATA REVIEW:   CBC Recent Labs  Lab 01/01/21 0450  WBC 11.1*  HGB 11.9*  HCT 33.6*  PLT 107*    Chemistries  Recent Labs  Lab 12/31/20 0514 01/01/21 0450  NA 137 134*  K 4.1 3.6  CL 102 101  CO2 24 25  GLUCOSE 100* 94  BUN 12 14  CREATININE 0.91 0.91  CALCIUM 9.2 8.8*  MG 2.2  --   AST 21  --   ALT 16  --   ALKPHOS 91  --   BILITOT 0.9  --     Microbiology Results  Results for orders placed or performed during the hospital encounter of 12/30/20  Resp Panel by RT-PCR (Flu A&B, Covid) Nasopharyngeal Swab     Status: None   Collection Time: 12/30/20 10:20 PM   Specimen: Nasopharyngeal Swab; Nasopharyngeal(NP) swabs in vial transport medium  Result Value Ref Range Status   SARS Coronavirus 2 by RT PCR NEGATIVE NEGATIVE Final    Comment: (NOTE) SARS-CoV-2 target nucleic acids are NOT DETECTED.  The SARS-CoV-2 RNA is generally detectable in upper respiratory specimens during the acute phase of infection. The lowest concentration of SARS-CoV-2 viral copies this assay can detect is 138 copies/mL. A negative result does not preclude SARS-Cov-2 infection and should not be used as the sole basis for treatment or other patient management decisions. A negative result may occur with  improper specimen collection/handling, submission of specimen other than nasopharyngeal swab, presence of viral mutation(s) within the areas targeted by this assay, and inadequate number of viral copies(<138 copies/mL). A negative result must be combined with clinical observations, patient history, and epidemiological information. The expected result is Negative.  Fact Sheet for Patients:  EntrepreneurPulse.com.au  Fact Sheet for Healthcare Providers:   IncredibleEmployment.be  This test is no t yet approved or cleared by the [place] FDA and  has been authorized for detection and/or diagnosis of SARS-CoV-2 by FDA under an Emergency Use Authorization (EUA). This EUA will remain  in effect (meaning this test can be used) for the duration of the COVID-19 declaration under Section 564(b)(1) of the Act, 21 U.S.C.section 360bbb-3(b)(1), unless the authorization is terminated  or revoked sooner.       Influenza A by PCR NEGATIVE NEGATIVE Final   Influenza B by PCR NEGATIVE NEGATIVE Final    Comment: (NOTE) The Xpert Xpress SARS-CoV-2/FLU/RSV plus assay  is intended as an aid in the diagnosis of influenza from Nasopharyngeal swab specimens and should not be used as a sole basis for treatment. Nasal washings and aspirates are unacceptable for Xpert Xpress SARS-CoV-2/FLU/RSV testing.  Fact Sheet for Patients: EntrepreneurPulse.com.au  Fact Sheet for Healthcare Providers: IncredibleEmployment.be  This test is not yet approved or cleared by the [place] FDA and has been authorized for detection and/or diagnosis of SARS-CoV-2 by FDA under an Emergency Use Authorization (EUA). This EUA will remain in effect (meaning this test can be used) for the duration of the COVID-19 declaration under Section 564(b)(1) of the Act, 21 U.S.C. section 360bbb-3(b)(1), unless the authorization is terminated or revoked.  Performed at [facility], [address] [place], [place] [address]     RADIOLOGY:  ECHOCARDIOGRAM COMPLETE  Result Date: 01/02/2021    ECHOCARDIOGRAM REPORT   Patient Name:   Samuel Choi Date of Exam: 01/01/2021 Medical Rec #:  [id number]      Height:       73.0 in Accession #:    [id number]     Weight:       124.6 lb Date of Birth:  April 05, 1966       BSA:          1.759 m Patient Age:    55 years       BP:           144/86 mmHg Patient Gender: M              HR:            99 bpm. Exam Location:  [facility] Procedure: 2D Echo, Cardiac Doppler and Color Doppler Indications:     Ventricular Tachycardia I47.2  History:         Patient has no prior history of Echocardiogram examinations.                  Risk Factors:Hypertension.  Sonographer:     Alyse Low Roar Referring Phys:  [id number] Loletha Grayer Diagnosing Phys: Isaias Cowman MD IMPRESSIONS  1. Left ventricular ejection fraction, by estimation, is 60 to 65%. The left ventricle has normal function. The left ventricle has no regional wall motion abnormalities. Left ventricular diastolic parameters are consistent with Grade I diastolic dysfunction (impaired relaxation).  2. Right ventricular systolic function is normal. The right ventricular size is normal.  3. The mitral valve is normal in structure. Mild mitral valve regurgitation. No evidence of mitral stenosis.  4. Tricuspid valve regurgitation is moderate to severe.  5. The aortic valve is normal in structure. Aortic valve regurgitation is not visualized. No aortic stenosis is present.  6. The inferior vena cava is normal in size with greater than 50% respiratory variability, suggesting right atrial pressure of 3 mmHg. FINDINGS  Left Ventricle: Left ventricular ejection fraction, by estimation, is 60 to 65%. The left ventricle has normal function. The left ventricle has no regional wall motion abnormalities. The left ventricular internal cavity size was normal in size. There is  no left ventricular hypertrophy. Left ventricular diastolic parameters are consistent with Grade I diastolic dysfunction (impaired relaxation). Right Ventricle: The right ventricular size is normal. No increase in right ventricular wall thickness. Right ventricular systolic function is normal. Left Atrium: Left atrial size was normal in size. Right Atrium: Right atrial size was normal in size. Pericardium: There is no evidence of pericardial effusion. Mitral Valve: The mitral valve is normal in  structure. Mild mitral valve regurgitation. No evidence  of mitral valve stenosis. Tricuspid Valve: The tricuspid valve is normal in structure. Tricuspid valve regurgitation is moderate to severe. No evidence of tricuspid stenosis. Aortic Valve: The aortic valve is normal in structure. Aortic valve regurgitation is not visualized. No aortic stenosis is present. Aortic valve peak gradient measures 7.5 mmHg. Pulmonic Valve: The pulmonic valve was normal in structure. Pulmonic valve regurgitation is not visualized. No evidence of pulmonic stenosis. Aorta: The aortic root is normal in size and structure. Venous: The inferior vena cava is normal in size with greater than 50% respiratory variability, suggesting right atrial pressure of 3 mmHg. IAS/Shunts: No atrial level shunt detected by color flow Doppler.  LEFT VENTRICLE PLAX 2D LVIDd:         4.20 cm  Diastology LVIDs:         2.90 cm  LV e' medial:    4.90 cm/s LV PW:         1.10 cm  LV E/e' medial:  13.3 LV IVS:        1.10 cm  LV e' lateral:   6.09 cm/s LVOT diam:     1.90 cm  LV E/e' lateral: 10.7 LVOT Area:     2.84 cm  RIGHT VENTRICLE RV Mid diam:    4.10 cm RV S prime:     11.10 cm/s TAPSE (M-mode): 1.7 cm LEFT ATRIUM             Index       RIGHT ATRIUM           Index LA diam:        3.10 cm 1.76 cm/m  RA Area:     16.90 cm LA Vol (A2C):   43.7 ml 24.84 ml/m RA Volume:   42.00 ml  23.87 ml/m LA Vol (A4C):   42.4 ml 24.10 ml/m LA Biplane Vol: 44.1 ml 25.07 ml/m  AORTIC VALVE                PULMONIC VALVE AV Area (Vmax): 2.96 cm    PV Vmax:        0.85 m/s AV Vmax:        137.00 cm/s PV Peak grad:   2.9 mmHg AV Peak Grad:   7.5 mmHg    RVOT Peak grad: 1 mmHg LVOT Vmax:      143.00 cm/s  AORTA Ao Root diam: 3.00 cm MITRAL VALVE               TRICUSPID VALVE MV Area (PHT): 3.42 cm    TR Peak grad:   78.9 mmHg MV Decel Time: 222 msec    TR Vmax:        444.00 cm/s MV E velocity: 65.10 cm/s MV A velocity: 77.60 cm/s  SHUNTS MV E/A ratio:  0.84         Systemic Diam: 1.90 cm MV A Prime:    10.6 cm/s Isaias Cowman MD Electronically signed by Isaias Cowman MD Signature Date/Time: 01/02/2021/10:02:39 AM    Final       Management plans discussed with the patient, family (wife yesterday about the plan) and they are in agreement.  CODE STATUS:     Code Status Orders  (From admission, onward)           Start     Ordered   12/30/20 2256  Full code  Continuous        12/30/20 2256           Code  Status History     Date Active Date Inactive Code Status Order ID Comments User Context   08/13/2018 1420 08/24/2018 1739 Full Code [id number]  [name], MD Inpatient       TOTAL TIME TAKING CARE OF THIS PATIENT: 35 minutes.    Loletha Grayer M.D on 01/02/2021 at 4:38 PM  Between 7am to 6pm - Pager - [phone]  After 6pm go to [email or web address] - password [email or web address]  [facility]  CC: Primary care physician; Tracie Harrier, MD

## 2021-01-02 NOTE — Progress Notes (Signed)
    Subjective  - POD #1  Complaining of pain by his left ankle   Physical Exam:  The area of discomfort is in a gap from his dressing.  This area is more edematous than the rest of the leg       Assessment/Plan:  POD #1  The current dressing was removed and a new dressing was placed from the toes up to the mid thigh with Kerlix, Ace wrap, and Coban.  He has already obtained compression socks.  I told him to remove the dressing and place the socks on when he gets home.  He is being discharged on oral anticoagulation.  [place] Samuel Choi 01/02/2021 11:38 AM --  Vitals:   01/02/21 0413 01/02/21 0751  BP: 121/76 125/83  Pulse: 83 85  Resp: 16 17  Temp: 98 F (36.7 C) 98.2 F (36.8 C)  SpO2: 98% 97%    Intake/Output Summary (Last 24 hours) at 01/02/2021 1138 Last data filed at 01/02/2021 1026 Gross per 24 hour  Intake 600 ml  Output --  Net 600 ml     Laboratory CBC    Component Value Date/Time   WBC 11.1 (H) 01/01/2021 0450   HGB 11.9 (L) 01/01/2021 0450   HGB 15.9 11/02/2014 1859   HCT 33.6 (L) 01/01/2021 0450   HCT 46.0 11/02/2014 1859   PLT 107 (L) 01/01/2021 0450   PLT 143 (L) 11/02/2014 1859    BMET    Component Value Date/Time   NA 134 (L) 01/01/2021 0450   NA 137 11/02/2014 1859   K 3.6 01/01/2021 0450   K 3.5 11/02/2014 1859   CL 101 01/01/2021 0450   CL 101 11/02/2014 1859   CO2 25 01/01/2021 0450   CO2 26 11/02/2014 1859   GLUCOSE 94 01/01/2021 0450   GLUCOSE 100 (H) 11/02/2014 1859   BUN 14 01/01/2021 0450   BUN 11 11/02/2014 1859   CREATININE 0.91 01/01/2021 0450   CREATININE 1.06 11/02/2014 1859   CALCIUM 8.8 (L) 01/01/2021 0450   CALCIUM 8.9 11/02/2014 1859   GFRNONAA >60 01/01/2021 0450   GFRNONAA >60 11/02/2014 1859   GFRAA >60 04/07/2020 0854   GFRAA >60 11/02/2014 1859    COAG Lab Results  Component Value Date   INR 1.0 12/30/2020   INR 1.0 01/15/2019   INR 0.97 08/19/2018   No results found for:  PTT  Antibiotics Anti-infectives (From admission, onward)    Start     Dose/Rate Route Frequency Ordered Stop   01/01/21 0000  ceFAZolin (ANCEF) IVPB 2g/100 mL premix       Note to Pharmacy: To be given in specials   2 g 200 mL/hr over 30 Minutes Intravenous  Once 12/31/20 1320 12/31/20 1524        V. Leia Alf, M.D., [facility] [place] Office: [phone] Pager:  [phone]

## 2021-01-02 NOTE — Progress Notes (Signed)
[facility]  SUBJECTIVE: Patient laying comfortably in bed, denies chest pain, shortness of breath, palpitations or heart racing   Vitals:   01/02/21 0057 01/02/21 0111 01/02/21 0413 01/02/21 0751  BP: 108/78  121/76 125/83  Pulse: 83  83 85  Resp: 16  16 17   Temp: 98.4 F (36.9 C)  98 F (36.7 C) 98.2 F (36.8 C)  TempSrc:    Oral  SpO2: 95%  98% 97%  Weight:  55.1 kg    Height:         Intake/Output Summary (Last 24 hours) at 01/02/2021 1006 Last data filed at 01/01/2021 2000 Gross per 24 hour  Intake 600 ml  Output --  Net 600 ml      PHYSICAL EXAM  General: Well developed, well nourished, in no acute distress HEENT:  Normocephalic and atramatic Neck:  No JVD.  Lungs: Clear bilaterally to auscultation and percussion. Heart: HRRR . Normal S1 and S2 without gallops or murmurs.  Abdomen: Bowel sounds are positive, abdomen soft and non-tender  Msk:  Back normal, normal gait. Normal strength and tone for age. Extremities: No clubbing, cyanosis or edema.   Neuro: Alert and oriented X 3. Psych:  Good affect, responds appropriately   LABS: Basic Metabolic Panel: Recent Labs    12/30/20 1030 12/31/20 0514 01/01/21 0450  NA 137 137 134*  K 4.2 4.1 3.6  CL 103 102 101  CO2 23 24 25   GLUCOSE 94 100* 94  BUN 15 12 14   CREATININE 0.97 0.91 0.91  CALCIUM 9.7 9.2 8.8*  MG 1.6* 2.2  --   PHOS  --  3.2  --    Liver Function Tests: Recent Labs    12/31/20 0514  AST 21  ALT 16  ALKPHOS 91  BILITOT 0.9  PROT 7.7  ALBUMIN 3.5   No results for input(s): LIPASE, AMYLASE in the last 72 hours. CBC: Recent Labs    12/30/20 1030 12/31/20 0514 01/01/21 0450  WBC 10.8* 10.3 11.1*  NEUTROABS 8.5*  --   --   HGB 16.2 14.7 11.9*  HCT 45.2 40.4 33.6*  MCV 107.4* 106.3* 106.0*  PLT 146* 142* 107*   Cardiac Enzymes: No results for input(s): CKTOTAL, CKMB, CKMBINDEX, TROPONINI in the last 72 hours. BNP: Invalid input(s): POCBNP D-Dimer: No results for  input(s): DDIMER in the last 72 hours. Hemoglobin A1C: No results for input(s): HGBA1C in the last 72 hours. Fasting Lipid Panel: No results for input(s): CHOL, HDL, LDLCALC, TRIG, CHOLHDL, LDLDIRECT in the last 72 hours. Thyroid Function Tests: No results for input(s): TSH, T4TOTAL, T3FREE, THYROIDAB in the last 72 hours.  Invalid input(s): FREET3 Anemia Panel: No results for input(s): VITAMINB12, FOLATE, FERRITIN, TIBC, IRON, RETICCTPCT in the last 72 hours.  PERIPHERAL VASCULAR CATHETERIZATION  Result Date: 12/31/2020 See surgical note for result.  ECHOCARDIOGRAM COMPLETE  Result Date: 01/02/2021    ECHOCARDIOGRAM REPORT   Patient Name:   LANSON RANDLE Date of Exam: 01/01/2021 Medical Rec #:  [id number]      Height:       73.0 in Accession #:    [id number]     Weight:       124.6 lb Date of Birth:  02-27-1966       BSA:          1.759 m Patient Age:    22 years       BP:           144/86 mmHg Patient Gender:  M              HR:           99 bpm. Exam Location:  [facility] Procedure: 2D Echo, Cardiac Doppler and Color Doppler Indications:     Ventricular Tachycardia I47.2  History:         Patient has no prior history of Echocardiogram examinations.                  Risk Factors:Hypertension.  Sonographer:     Alyse Low Roar Referring Phys:  [id number] Loletha Grayer Diagnosing Phys: Isaias Cowman MD IMPRESSIONS  1. Left ventricular ejection fraction, by estimation, is 60 to 65%. The left ventricle has normal function. The left ventricle has no regional wall motion abnormalities. Left ventricular diastolic parameters are consistent with Grade I diastolic dysfunction (impaired relaxation).  2. Right ventricular systolic function is normal. The right ventricular size is normal.  3. The mitral valve is normal in structure. Mild mitral valve regurgitation. No evidence of mitral stenosis.  4. Tricuspid valve regurgitation is moderate to severe.  5. The aortic valve is normal in structure. Aortic valve  regurgitation is not visualized. No aortic stenosis is present.  6. The inferior vena cava is normal in size with greater than 50% respiratory variability, suggesting right atrial pressure of 3 mmHg. FINDINGS  Left Ventricle: Left ventricular ejection fraction, by estimation, is 60 to 65%. The left ventricle has normal function. The left ventricle has no regional wall motion abnormalities. The left ventricular internal cavity size was normal in size. There is  no left ventricular hypertrophy. Left ventricular diastolic parameters are consistent with Grade I diastolic dysfunction (impaired relaxation). Right Ventricle: The right ventricular size is normal. No increase in right ventricular wall thickness. Right ventricular systolic function is normal. Left Atrium: Left atrial size was normal in size. Right Atrium: Right atrial size was normal in size. Pericardium: There is no evidence of pericardial effusion. Mitral Valve: The mitral valve is normal in structure. Mild mitral valve regurgitation. No evidence of mitral valve stenosis. Tricuspid Valve: The tricuspid valve is normal in structure. Tricuspid valve regurgitation is moderate to severe. No evidence of tricuspid stenosis. Aortic Valve: The aortic valve is normal in structure. Aortic valve regurgitation is not visualized. No aortic stenosis is present. Aortic valve peak gradient measures 7.5 mmHg. Pulmonic Valve: The pulmonic valve was normal in structure. Pulmonic valve regurgitation is not visualized. No evidence of pulmonic stenosis. Aorta: The aortic root is normal in size and structure. Venous: The inferior vena cava is normal in size with greater than 50% respiratory variability, suggesting right atrial pressure of 3 mmHg. IAS/Shunts: No atrial level shunt detected by color flow Doppler.  LEFT VENTRICLE PLAX 2D LVIDd:         4.20 cm  Diastology LVIDs:         2.90 cm  LV e' medial:    4.90 cm/s LV PW:         1.10 cm  LV E/e' medial:  13.3 LV IVS:         1.10 cm  LV e' lateral:   6.09 cm/s LVOT diam:     1.90 cm  LV E/e' lateral: 10.7 LVOT Area:     2.84 cm  RIGHT VENTRICLE RV Mid diam:    4.10 cm RV S prime:     11.10 cm/s TAPSE (M-mode): 1.7 cm LEFT ATRIUM             Index  RIGHT ATRIUM           Index LA diam:        3.10 cm 1.76 cm/m  RA Area:     16.90 cm LA Vol (A2C):   43.7 ml 24.84 ml/m RA Volume:   42.00 ml  23.87 ml/m LA Vol (A4C):   42.4 ml 24.10 ml/m LA Biplane Vol: 44.1 ml 25.07 ml/m  AORTIC VALVE                PULMONIC VALVE AV Area (Vmax): 2.96 cm    PV Vmax:        0.85 m/s AV Vmax:        137.00 cm/s PV Peak grad:   2.9 mmHg AV Peak Grad:   7.5 mmHg    RVOT Peak grad: 1 mmHg LVOT Vmax:      143.00 cm/s  AORTA Ao Root diam: 3.00 cm MITRAL VALVE               TRICUSPID VALVE MV Area (PHT): 3.42 cm    TR Peak grad:   78.9 mmHg MV Decel Time: 222 msec    TR Vmax:        444.00 cm/s MV E velocity: 65.10 cm/s MV A velocity: 77.60 cm/s  SHUNTS MV E/A ratio:  0.84        Systemic Diam: 1.90 cm MV A Prime:    10.6 cm/s Isaias Cowman MD Electronically signed by Isaias Cowman MD Signature Date/Time: 01/02/2021/10:02:39 AM    Final      Echo LVEF 60-65%, moderate to severe tricuspid regurgitation  TELEMETRY: Sinus tachycardia with nonspecific IVCD:  ASSESSMENT AND PLAN:  Principal Problem:   Acute deep vein thrombosis (DVT) (HCC) Active Problems:   Essential hypertension   Squamous cell carcinoma of right lung (HCC)   Emphysema of lung (HCC)   Allergic rhinitis   Wide-complex tachycardia (HCC)    1.  Nonsustained, brief, wide-complex tachycardia, likely SVT with aberrancy, not ventricular tachycardia, asymptomatic 2.  Inappropriate sinus tachycardia 3.  Status post left lower extremity DVT thrombolysis and thrombectomy 4.  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  Tricuspid regurgitation of uncertain medical significance   Recommendations   1.  Agree with overall current therapy 2.  Continue  Cardizem CD 3.  Continue metoprolol succinate 25 mg daily 4.  Defer further cardiac diagnostics at this time  Sign off for now, please call if any questions   Isaias Cowman, MD, PhD, [facility] 01/02/2021 10:06 AM

## 2021-01-03 ENCOUNTER — Encounter: Payer: Self-pay | Admitting: Vascular Surgery

## 2021-01-06 ENCOUNTER — Ambulatory Visit
Admission: RE | Admit: 2021-01-06 | Discharge: 2021-01-06 | Disposition: A | Discharge status: Home / Self Care | Payer: 59 | Source: Ambulatory Visit | Attending: Oncology | Admitting: Oncology

## 2021-01-06 ENCOUNTER — Other Ambulatory Visit: Payer: Self-pay

## 2021-01-06 DIAGNOSIS — Z95828 Presence of other vascular implants and grafts: Secondary | ICD-10-CM | POA: Diagnosis present

## 2021-01-06 DIAGNOSIS — C3491 Malignant neoplasm of unspecified part of right bronchus or lung: Secondary | ICD-10-CM | POA: Diagnosis not present

## 2021-01-06 MED ORDER — HEPARIN SOD (PORK) LOCK FLUSH 100 UNIT/ML IV SOLN
INTRAVENOUS | Status: AC
  Filled 2021-01-06: qty 5

## 2021-01-06 MED ORDER — IOHEXOL 180 MG/ML  SOLN
20.0000 mL | Freq: Once | INTRAMUSCULAR | Status: AC | PRN
  Administered 2021-01-06: 20 mL via INTRATHECAL

## 2021-01-11 ENCOUNTER — Inpatient Hospital Stay: Payer: 59 | Attending: Oncology

## 2021-01-11 ENCOUNTER — Inpatient Hospital Stay (HOSPITAL_BASED_OUTPATIENT_CLINIC_OR_DEPARTMENT_OTHER): Payer: 59 | Admitting: Oncology

## 2021-01-11 ENCOUNTER — Inpatient Hospital Stay: Payer: 59

## 2021-01-11 ENCOUNTER — Encounter: Payer: Self-pay | Admitting: Oncology

## 2021-01-11 VITALS — BP 136/83 | HR 91 | Temp 97.1°F | Resp 16 | Wt 136.3 lb

## 2021-01-11 DIAGNOSIS — I82412 Acute embolism and thrombosis of left femoral vein: Secondary | ICD-10-CM

## 2021-01-11 DIAGNOSIS — Z923 Personal history of irradiation: Secondary | ICD-10-CM | POA: Diagnosis not present

## 2021-01-11 DIAGNOSIS — C3491 Malignant neoplasm of unspecified part of right bronchus or lung: Secondary | ICD-10-CM

## 2021-01-11 DIAGNOSIS — Z7951 Long term (current) use of inhaled steroids: Secondary | ICD-10-CM | POA: Insufficient documentation

## 2021-01-11 DIAGNOSIS — Z7901 Long term (current) use of anticoagulants: Secondary | ICD-10-CM | POA: Diagnosis not present

## 2021-01-11 DIAGNOSIS — E876 Hypokalemia: Secondary | ICD-10-CM | POA: Diagnosis not present

## 2021-01-11 DIAGNOSIS — Z95828 Presence of other vascular implants and grafts: Secondary | ICD-10-CM | POA: Diagnosis not present

## 2021-01-11 DIAGNOSIS — Z79899 Other long term (current) drug therapy: Secondary | ICD-10-CM | POA: Insufficient documentation

## 2021-01-11 DIAGNOSIS — Z5112 Encounter for antineoplastic immunotherapy: Secondary | ICD-10-CM

## 2021-01-11 DIAGNOSIS — I10 Essential (primary) hypertension: Secondary | ICD-10-CM | POA: Diagnosis not present

## 2021-01-11 DIAGNOSIS — R059 Cough, unspecified: Secondary | ICD-10-CM

## 2021-01-11 DIAGNOSIS — Z85118 Personal history of other malignant neoplasm of bronchus and lung: Secondary | ICD-10-CM | POA: Diagnosis not present

## 2021-01-11 DIAGNOSIS — Z902 Acquired absence of lung [part of]: Secondary | ICD-10-CM | POA: Diagnosis not present

## 2021-01-11 LAB — COMPREHENSIVE METABOLIC PANEL
ALT: 16 U/L (ref 0–44)
AST: 23 U/L (ref 15–41)
Albumin: 3.6 g/dL (ref 3.5–5.0)
Alkaline Phosphatase: 81 U/L (ref 38–126)
Anion gap: 10 (ref 5–15)
BUN: 17 mg/dL (ref 6–20)
CO2: 26 mmol/L (ref 22–32)
Calcium: 9.3 mg/dL (ref 8.9–10.3)
Chloride: 99 mmol/L (ref 98–111)
Creatinine, Ser: 1.21 mg/dL (ref 0.61–1.24)
GFR, Estimated: >60 mL/min (ref >60)
Glucose, Bld: 108 mg/dL — ABNORMAL HIGH (ref 70–99)
Potassium: 3.5 mmol/L (ref 3.5–5.1)
Sodium: 135 mmol/L (ref 135–145)
Total Bilirubin: 0.7 mg/dL (ref 0.3–1.2)
Total Protein: 7.6 g/dL (ref 6.5–8.1)

## 2021-01-11 LAB — CBC WITH DIFFERENTIAL/PLATELET
Abs Immature Granulocytes: 0.05 10*3/uL (ref 0.00–0.07)
Basophils Absolute: 0.1 10*3/uL (ref 0.0–0.1)
Basophils Relative: 1 %
Eosinophils Absolute: 0.3 10*3/uL (ref 0.0–0.5)
Eosinophils Relative: 2 %
HCT: 35.6 % — ABNORMAL LOW (ref 39.0–52.0)
Hemoglobin: 12.8 g/dL — ABNORMAL LOW (ref 13.0–17.0)
Immature Granulocytes: 0 %
Lymphocytes Relative: 10 %
Lymphs Abs: 1.2 10*3/uL (ref 0.7–4.0)
MCH: 38.4 pg — ABNORMAL HIGH (ref 26.0–34.0)
MCHC: 36 g/dL (ref 30.0–36.0)
MCV: 106.9 fL — ABNORMAL HIGH (ref 80.0–100.0)
Monocytes Absolute: 1.1 10*3/uL — ABNORMAL HIGH (ref 0.1–1.0)
Monocytes Relative: 8 %
Neutro Abs: 10.2 10*3/uL — ABNORMAL HIGH (ref 1.7–7.7)
Neutrophils Relative %: 79 %
Platelets: 208 10*3/uL (ref 150–400)
RBC: 3.33 MIL/uL — ABNORMAL LOW (ref 4.22–5.81)
RDW: 11.7 % (ref 11.5–15.5)
WBC: 12.9 10*3/uL — ABNORMAL HIGH (ref 4.0–10.5)
nRBC: 0 % (ref 0.0–0.2)

## 2021-01-11 MED ORDER — SODIUM CHLORIDE 0.9% FLUSH
10.0000 mL | INTRAVENOUS | Status: DC | PRN
  Administered 2021-01-11: 10 mL via INTRAVENOUS
  Filled 2021-01-11: qty 10

## 2021-01-11 MED ORDER — SODIUM CHLORIDE 0.9 % IV SOLN
Freq: Once | INTRAVENOUS | Status: AC
  Filled 2021-01-11: qty 250

## 2021-01-11 MED ORDER — HEPARIN SOD (PORK) LOCK FLUSH 100 UNIT/ML IV SOLN
500.0000 [IU] | Freq: Once | INTRAVENOUS | Status: AC
  Administered 2021-01-11: 500 [IU] via INTRAVENOUS
  Filled 2021-01-11: qty 5

## 2021-01-11 MED ORDER — HEPARIN SOD (PORK) LOCK FLUSH 100 UNIT/ML IV SOLN
INTRAVENOUS | Status: AC
  Filled 2021-01-11: qty 5

## 2021-01-11 MED ORDER — SODIUM CHLORIDE 0.9 % IV SOLN
200.0000 mg | Freq: Once | INTRAVENOUS | Status: AC
  Administered 2021-01-11: 200 mg via INTRAVENOUS
  Filled 2021-01-11: qty 8

## 2021-01-11 NOTE — Progress Notes (Signed)
Hematology/Oncology Follow up note [facility] Telephone:([phone] Fax:([phone]   Patient Care Team: Tracie Harrier, MD as PCP - General (Internal Medicine) Earlie Server, MD as Consulting Physician (Hematology and Oncology)  REASON FOR VISIT:  Follow up for treatment of squamous lung cancer.   HISTORY OF PRESENTING ILLNESS:  # Dec 2019 Stage I squamous lung cancer #s/p Bronchoscopy on 06/25/2018. subcarina EBUS FNA was non diagnostic, hypocellular specimen.  # 08/13/2018 CT guided right upper lobe biopsy pathology showed dense fibrosis and mixed inflammatory cells with prominent polytypic plasma cells component. Focal benign bronchial wall and alveolar spaces. No malignancy was identified.   # 08/13/2018 underwent right thoracotomy and wedge resection of a right upper lobe mass.  Frozen section was consistent with an inflammatory process.  On the second postop day, preliminary pathology reports high-grade malignancy.  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  08/20/2018 Patient was therefore taken back to the OR and underwent take complete lobectomy  pT1b pN0 cM0 stage I squamous cell lung cancer.  Margin is negative.  Recommend observation.    # 12/03/2018 CT chest w contrast showed local recurrence.  12/10/2018 PET showed No evidence of distant metastatic disease # 12/26/2018 s/p bronchoscopy biopsy. Confirmed local recurrence of squamous cell lung cancer.  medi port placed by Dr.Oaks.  # 06/14/2020 CT chest w contrast was reviewed and discussed with patient.  Evidence of progressive lymphangitic spread of tumor throughout the right lung, with contralateral lung nodules with  right pleural effusion  MRI brain is negative for CNS involvement.  CT abdomen with contrast showed no evidence of abdominal metastatic disease PET scan was not approved by insurance-peer to peer appeal with Dr.Vipul Bhanderi   # NGS - KRAS V14L,  TMB 26.6-high, MS stable, PD-L1  5% CDKN2C G48, DICER1c.2117-1G>T, FLT4 G1131S, NF1 A44f, NF1 L2643*, STK11 N1810fCancer treatment Started concurrent chemoradiation on 01/16/2019 Carboplatin AUC of 2 and Taxol 45 mg/m2 weekly finished in 03/05/2019 04/10/2019, patient started on durvalumab maintenance. -CT showed progression.  Insurance denied PET scan evaluation. 07/16/2020-09/28/2020 4 cycles of carboplatin/paclitaxel/Keytruda  10/15/2020, CT chest abdomen pelvis redemonstrated postoperative and postradiation appearance of the right chest with perihilar consolidation and fibrosis.  Slight interval decrease in size of the pulmonary nodules associated with interlobular septal thickening nearly complete resolution of previously seen left-sided pulmonary nodules.  Minimal irregular residual.  Right pleural effusion improved.  Consistent with treatment response.  No evidence of metastatic disease with the abdomen or pelvis.  None obstructive bilateral nephrolithiasis - partial response to the treatment- 10/19/2020, Keytruda monotherapy maintenance  INTERVAL HISTORY Samuel HOSEAs a [age].o. male who has above history reviewed by me today presents for reatment of  recurrent  squamous cell lung cancer. Patient has been on Keytruda maintenance. 12/30/2020, patient was admitted due to unprovoked extensive deep vein thrombosis from calf vein to the common femoral vein on the left.  Patient was started on heparin drip.  Patient underwent thrombolysis and is a colectomy on 12/31/2020.  Anticoagulation was switched to Eliquis at discharge. Patient presents to follow-up.  Left lower extremity swelling and pain has resolved. Patient has some chronic cough, unchanged.     Review of Systems  Constitutional:  Positive for fatigue. Negative for appetite change, chills, fever and unexpected weight change.  HENT:   Negative for hearing loss and voice change.   Eyes:  Negative for eye problems and icterus.  Respiratory:  Positive for cough. Negative  for chest tightness and  shortness of breath.   Cardiovascular:  Negative for chest pain and leg swelling.  Gastrointestinal:  Negative for abdominal distention, abdominal pain and nausea.  Endocrine: Negative for hot flashes.  Genitourinary:  Negative for difficulty urinating, dysuria and frequency.   Musculoskeletal:  Negative for arthralgias.  Skin:  Negative for itching and rash.  Neurological:  Negative for light-headedness and numbness.  Hematological:  Negative for adenopathy. Does not bruise/bleed easily.  Psychiatric/Behavioral:  Negative for confusion.    MEDICAL HISTORY:  Past Medical History:  Diagnosis Date   Alcohol abuse    usually drinks 2-3 drinks per day   Atherosclerosis 06/2018   Chronic sinusitis    Dehydration 02/07/2019   Emphysema of lung ([place]) 06/2018   patient unaware of this.   Hip fracture ([place]) 05/2018   no surgery   History of kidney stones 05/2018   per xray, bilateral nephrolitiasis   Hypertension    Squamous cell carcinoma of lung, right ([place]) 06/2018    SURGICAL HISTORY: Past Surgical History:  Procedure Laterality Date   BRAIN SURGERY  10/2017   nasal/sinus endoscopy. mass benign   ELECTROMAGNETIC NAVIGATION BROCHOSCOPY Right 06/25/2018   Procedure: ELECTROMAGNETIC NAVIGATION BRONCHOSCOPY;  Surgeon: [name], MD;  Location: [facility];  Service: Cardiopulmonary;  Laterality: Right;   ENDOBRONCHIAL ULTRASOUND Right 06/25/2018   Procedure: ENDOBRONCHIAL ULTRASOUND;  Surgeon: [name], MD;  Location: [facility];  Service: Cardiopulmonary;  Laterality: Right;   ENDOBRONCHIAL ULTRASOUND Right 12/26/2018   Procedure: ENDOBRONCHIAL ULTRASOUND RIGHT;  Surgeon: [name], MD;  Location: [facility];  Service: Cardiopulmonary;  Laterality: Right;   FLEXIBLE BRONCHOSCOPY N/A 08/20/2018   Procedure: FLEXIBLE BRONCHOSCOPY PREOP;  Surgeon: [name], MD;  Location: [facility];  Service: Thoracic;  Laterality: N/A;   IR CV LINE INJECTION  04/04/2019    NASAL SINUS SURGERY  10/2017   At [facility], frontal sinusotomy, ethmoidectomy, resection anterior cranial fossa neoplasm, turbinate resection   PERIPHERAL VASCULAR THROMBECTOMY Left 12/31/2020   Procedure: PERIPHERAL VASCULAR THROMBECTOMY;  Surgeon: Algernon Huxley, MD;  Location: [place] [facility];  Service: Cardiovascular;  Laterality: Left;   PORTACATH PLACEMENT Left 01/15/2019   Procedure: INSERTION PORT-A-CATH;  Surgeon: [name], MD;  Location: [facility];  Service: General;  Laterality: Left;   THORACOTOMY Right 08/13/2018   Procedure: PREOP BROCHOSCOPY WITH RIGHT THORACOTOMY AND RUL RESECTION;  Surgeon: [name], MD;  Location: [facility];  Service: General;  Laterality: Right;   THORACOTOMY Right 08/20/2018   Procedure: THORACOTOMY MAJOR RIGHT UPPER LOBE LOBECTOMY;  Surgeon: [name], MD;  Location: [facility];  Service: Thoracic;  Laterality: Right;   TOE SURGERY Left    pin in left toe    SOCIAL HISTORY: Social History   Socioeconomic History   Marital status: Married    Spouse name: lisa   Number of children: Not on file   Years of education: Not on file   Highest education level: Not on file  Occupational History   Occupation: welding    Comment: taking time off to resolve issues  Tobacco Use   Smoking status: Former    Packs/day: 0.50    Years: 15.00    Pack years: 7.50    Types: Cigarettes    Quit date: 08/2018    Years since quitting: 2.4   Smokeless tobacco: Never  Vaping Use   Vaping Use: Never used  Substance and Sexual Activity   Alcohol use: Yes    Alcohol/week: 3.0 standard drinks    Types: 3  Cans of beer per week    Comment: usually 2 drinks per week, per patient   Drug use: No   Sexual activity: Not on file  Other Topics Concern   Not on file  Social History Narrative   Not on file   Social Determinants of Health   Financial Resource Strain: Not on file  Food Insecurity: Not on file  Transportation Needs: Not on file  Physical Activity: Not  on file  Stress: Not on file  Social Connections: Not on file  Intimate Partner Violence: Not on file    FAMILY HISTORY: Family History  Problem Relation Age of Onset   Breast cancer Mother    Diabetes Mother    Lung cancer Father    Hypertension Father     ALLERGIES:  has No Known Allergies.  MEDICATIONS:  Current Outpatient Medications  Medication Sig Dispense Refill   albuterol (VENTOLIN HFA) 108 (90 Base) MCG/ACT inhaler TAKE 2 PUFFS BY MOUTH EVERY 6 HOURS AS NEEDED FOR WHEEZE OR SHORTNESS OF BREATH 6.7 each 6   apixaban (ELIQUIS) 5 MG TABS tablet Two tabs po twice a day for seven days then one tab po twice a day afterwards (Patient taking differently: 5 mg 2 (two) times daily.) 74 tablet 0   diltiazem (CARDIZEM CD) 120 MG 24 hr capsule Take 120 mg by mouth daily.     folic acid (V-R FOLIC ACID) 076 MCG tablet Take 1 tablet (400 mcg total) by mouth daily. 90 tablet 1   HYDROcodone-acetaminophen (NORCO/VICODIN) 5-325 MG tablet Take 1 tablet by mouth every 6 (six) hours as needed for severe pain. 10 tablet 0   loratadine (CLARITIN) 10 MG tablet Take 10 mg by mouth daily.     magnesium chloride (SLOW-MAG) 64 MG TBEC SR tablet Take 1 tablet (64 mg total) by mouth 2 (two) times daily. TAKE 1 TABLET (64 MG TOTAL) BY MOUTH DAILY. 60 tablet 1   methocarbamol (ROBAXIN) 500 MG tablet Take 1 tablet (500 mg total) by mouth at bedtime. 30 tablet 0   metoprolol succinate (TOPROL-XL) 25 MG 24 hr tablet Take 1 tablet (25 mg total) by mouth daily. 30 tablet 0   olmesartan (BENICAR) 40 MG tablet Take 40 mg by mouth every other day.      ondansetron (ZOFRAN) 8 MG tablet Take 1 tablet (8 mg total) by mouth every 8 (eight) hours as needed for refractory nausea / vomiting. Start on day 3 after chemo. 90 tablet 3   prochlorperazine (COMPAZINE) 10 MG tablet Take 1 tablet (10 mg total) by mouth every 6 (six) hours as needed (Nausea or vomiting). 90 tablet 3   SPIRIVA HANDIHALER 18 MCG inhalation capsule  INHALE 1 CAPSULE VIA HANDIHALER ONCE DAILY AT THE SAME TIME EVERY DAY 30 capsule 0   SYMBICORT 80-4.5 MCG/ACT inhaler INHALE 2 PUFFS INTO THE LUNGS IN THE MORNING AND AT BEDTIME. 10.2 each 6   vitamin B-12 (CYANOCOBALAMIN) 1000 MCG tablet Take 1 tablet (1,000 mcg total) by mouth daily. 90 tablet 1   hydrALAZINE (APRESOLINE) 100 MG tablet Take 50 mg by mouth 2 (two) times daily.  (Patient not taking: Reported on 01/11/2021)     No current facility-administered medications for this visit.   Facility-Administered Medications Ordered in Other Visits  Medication Dose Route Frequency Provider Last Rate Last Admin   albuterol (PROVENTIL) (2.5 MG/3ML) 0.083% nebulizer solution 2.5 mg  2.5 mg Nebulization Once System, Provider Not In       heparin lock flush  100 unit/mL  500 Units Intravenous Once Earlie Server, MD       sodium chloride flush (NS) 0.9 % injection 10 mL  10 mL Intravenous PRN Earlie Server, MD   10 mL at 04/04/19 0903   sodium chloride flush (NS) 0.9 % injection 10 mL  10 mL Intravenous PRN Earlie Server, MD   10 mL at 07/26/20 1308   sodium chloride flush (NS) 0.9 % injection 10 mL  10 mL Intravenous PRN Earlie Server, MD   10 mL at 01/11/21 0816     PHYSICAL EXAMINATION: ECOG PERFORMANCE STATUS: 1 - Symptomatic but completely ambulatory Vitals:   01/11/21 0837  BP: 136/83  Pulse: 91  Resp: 16  Temp: (!) 97.1 F (36.2 C)   Filed Weights   01/11/21 0837  Weight: 136 lb 4.8 oz (61.8 kg)    Physical Exam Constitutional:      General: He is not in acute distress. HENT:     Head: Normocephalic and atraumatic.  Eyes:     General: No scleral icterus. Cardiovascular:     Rate and Rhythm: Normal rate and regular rhythm.     Heart sounds: Normal heart sounds.  Pulmonary:     Effort: Pulmonary effort is normal. No respiratory distress.     Breath sounds: No wheezing.  Abdominal:     General: Bowel sounds are normal. There is no distension.     Palpations: Abdomen is soft.  Musculoskeletal:         General: No deformity. Normal range of motion.     Cervical back: Normal range of motion and neck supple.  Skin:    General: Skin is warm and dry.     Findings: No erythema or rash.  Neurological:     Mental Status: He is alert and oriented to person, place, and time. Mental status is at baseline.     Cranial Nerves: No cranial nerve deficit.     Coordination: Coordination normal.  Psychiatric:        Mood and Affect: Mood normal.     LABORATORY DATA:  I have reviewed the data as listed Lab Results  Component Value Date   WBC 12.9 (H) 01/11/2021   HGB 12.8 (L) 01/11/2021   HCT 35.6 (L) 01/11/2021   MCV 106.9 (H) 01/11/2021   PLT 208 01/11/2021   Recent Labs    03/17/20 0830 03/31/20 0825 04/07/20 0854 04/22/20 0825 08/31/20 0812 09/07/20 1117 12/21/20 0854 12/30/20 1030 12/31/20 0514 01/01/21 0450 01/11/21 0816  NA 134* 137 137   < > 137   < > 138   < > 137 134* 135  K 4.6 3.9 3.7   < > 3.1*   < > 4.5   < > 4.1 3.6 3.5  CL 101 102 104   < > 100   < > 102   < > 102 101 99  CO2 20* 23 21*   < > 25   < > 25   < > _0 GLUCOSE 167* 126* 163*   < > 107*   < > 81   < > 100* 94 108*  BUN _1 < > 7   < > 10   < > _2 CREATININE 1.19 1.10 1.10   < > 0.79   < > 1.01   < > 0.91 0.91 1.21  CALCIUM 9.0 9.1 8.8*   < > 8.9   < > 9.4   < >  9.2 8.8* 9.3  GFRNONAA >60 >60 >60   < > >60   < > >60   < > >60 >60 >60  GFRAA >60 >60 >60  --   --   --   --   --   --   --   --   PROT 7.7 7.6 7.1   < > 7.4   < > 7.9  --  7.7  --  7.6  ALBUMIN 3.5 3.5 3.4*   < > 3.4*   < > 3.7  --  3.5  --  3.6  AST 32 29 25   < > 31   < > 24  --  21  --  23  ALT _0 < > 13   < > 19  --  16  --  16  ALKPHOS 86 87 76   < > 101   < > 94  --  91  --  81  BILITOT 0.8 0.7 0.5   < > 2.0*   < > 0.4  --  0.9  --  0.7  BILIDIR  --   --   --   --  0.4*  --   --   --   --   --   --    < > = values in this interval not displayed.    Iron/TIBC/Ferritin/ %Sat No results found for: IRON,  TIBC, FERRITIN, IRONPCTSAT   RADIOGRAPHIC STUDIES: I have personally reviewed the radiological images as listed and agreed with the findings in the report. CT Chest Wo Contrast  Result Date: 12/16/2020 CLINICAL DATA:  Lung cancer surveillance. EXAM: CT CHEST WITHOUT CONTRAST TECHNIQUE: Multidetector CT imaging of the chest was performed following the standard protocol without IV contrast. COMPARISON:  Multiple prior examinations. The most recent is 10/15/2020. FINDINGS: Cardiovascular: The heart is normal in size. No pericardial effusion. Stable tortuosity and calcification of the thoracic aorta and stable scattered coronary artery calcifications. Mediastinum/Nodes: No mediastinal or hilar mass or overt adenopathy. The esophagus is grossly. The left-sided Port-A-Cath is in good position, unchanged. Lungs/Pleura: Stable underlying emphysematous changes. Stable postoperative and post radiation changes involving the right hilum and right paramediastinal lung with dense radiation fibrosis associated bronchiectasis. I do not see any findings suspicious for recurrent tumor. No worrisome pulmonary nodules to suggest pulmonary metastatic disease. Small pulmonary nodules identified on more remote chest CT scans have resolved. No new pulmonary lesions. No pleural effusions or pleural lesions. Upper Abdomen: No significant upper abdominal findings. No obvious hepatic or adrenal gland lesions. Stable aortic calcifications and bilateral renal calculi. Musculoskeletal: No chest wall mass, supraclavicular or axillary adenopathy. The bony thorax is intact. IMPRESSION: 1. Stable postoperative and post radiation changes involving the right hilum and right paramediastinal lung. No findings suspicious for recurrent tumor. 2. No mediastinal or hilar mass or adenopathy. 3. Stable emphysematous changes. 4. Stable atherosclerotic calcifications involving the aorta and coronary arteries. 5. Emphysema and aortic atherosclerosis. 6.  Small pulmonary nodules identified on more remote chest CT scans have resolved. No new or worrisome pulmonary lesions. Aortic Atherosclerosis (ICD10-I70.0) and Emphysema (ICD10-J43.9). Electronically Signed   By: Marijo Sanes M.D.   On: 12/16/2020 11:44   CT CHEST ABDOMEN PELVIS W CONTRAST  Result Date: 10/16/2020 CLINICAL DATA:  Lung cancer, restaging, assess treatment response EXAM: CT CHEST, ABDOMEN, AND PELVIS WITH CONTRAST TECHNIQUE: Multidetector CT imaging of the chest, abdomen and pelvis was performed following the standard protocol during bolus administration  of intravenous contrast. CONTRAST:  176m OMNIPAQUE IOHEXOL 300 MG/ML SOLN, additional oral enteric contrast COMPARISON:  07/06/2020 FINDINGS: CT CHEST FINDINGS Cardiovascular: Left chest port catheter. Aortic atherosclerosis. Normal heart size. Left coronary artery calcifications. No pericardial effusion. Mediastinum/Nodes: Unchanged soft thickening about the right hilum. No discretely enlarged mediastinal, hilar, or axillary lymph nodes. Thyroid gland, trachea, and esophagus demonstrate no significant findings. Lungs/Pleura: Redemonstrated postoperative and post appearance of the right chest status post right upper lobectomy with perihilar consolidation and fibrosis. Slight interval decrease in size of pulmonary nodules associated with interlobular septal thickening in the right lung, an index nodule measuring 0.7 x 0.4 cm, previously 1.1 x 0.6 cm (series 4, image 55). There has been nearly complete resolution of previously seen left-sided pulmonary nodules, with minimal irregular residua, for example in the left lower lobe measuring approximately 2 mm, previously 5 mm (series 4, image 104). Trace right pleural effusion, improved compared to prior examination. Musculoskeletal: No chest wall mass or suspicious bone lesions identified. CT ABDOMEN PELVIS FINDINGS Hepatobiliary: No solid liver abnormality is seen. No gallstones, gallbladder wall  thickening, or biliary dilatation. Pancreas: Unremarkable. No pancreatic ductal dilatation or surrounding inflammatory changes. Spleen: Normal in size without significant abnormality. Adrenals/Urinary Tract: Adrenal glands are unremarkable. Multiple punctuate bilateral nonobstructive renal calculi. No ureteral calculi or hydronephrosis. Bladder is unremarkable. Stomach/Bowel: Stomach is within normal limits. Appendix appears normal. No evidence of bowel wall thickening, distention, or inflammatory changes. Descending and sigmoid diverticulosis. Vascular/Lymphatic: Aortic atherosclerosis. No enlarged abdominal or pelvic lymph nodes. Reproductive: No mass or other abnormality. Other: No abdominal wall hernia or abnormality. No abdominopelvic ascites. Musculoskeletal: No acute or significant osseous findings. IMPRESSION: 1. Redemonstrated postoperative and post appearance of the right chest status post right upper lobectomy with perihilar consolidation and fibrosis. 2. Slight interval decrease in size of pulmonary nodules associated with interlobular septal thickening in the right lung. 3. There has been nearly complete resolution of previously seen left-sided pulmonary nodules, with minimal irregular residua. 4. Trace right pleural effusion, improved compared to prior examination. 5. Findings are consistent with treatment response of pulmonary metastatic disease. 6. No evidence of metastatic disease within the abdomen or pelvis. 7. Nonobstructive bilateral nephrolithiasis. 8. Coronary artery disease. Aortic Atherosclerosis (ICD10-I70.0). Electronically Signed   By: AEddie CandleM.D.   On: 10/16/2020 21:01   PERIPHERAL VASCULAR CATHETERIZATION  Result Date: 12/31/2020 See surgical note for result.  [place]Venous Img Lower Unilateral Left  Result Date: 12/30/2020 CLINICAL DATA:  Left leg swelling EXAM: LEFT LOWER EXTREMITY VENOUS DOPPLER ULTRASOUND TECHNIQUE: Gray-scale sonography with graded compression, as well as  color Doppler and duplex ultrasound were performed to evaluate the lower extremity deep venous systems from the level of the common femoral vein and including the common femoral, femoral, profunda femoral, popliteal and calf veins including the posterior tibial, peroneal and gastrocnemius veins when visible. The superficial great saphenous vein was also interrogated. Spectral Doppler was utilized to evaluate flow at rest and with distal augmentation maneuvers in the common femoral, femoral and popliteal veins. COMPARISON:  None. FINDINGS: Contralateral Common Femoral Vein: Respiratory phasicity is normal and symmetric with the symptomatic side. No evidence of thrombus. Normal compressibility. Common Femoral Vein: Thrombus is noted with decreased compressibility. The thrombus is occlusive in nature. Saphenofemoral Junction: No evidence of thrombus. Normal compressibility and flow on color Doppler imaging. Profunda Femoral Vein: Thrombus is noted with decreased compressibility. Femoral Vein: Thrombus is noted with decreased compressibility. Popliteal Vein: Thrombus is noted with decreased compressibility. Calf  Veins: Thrombus is noted with decreased compressibility. Superficial Great Saphenous Vein: No evidence of thrombus. Normal compressibility. Venous Reflux:  None. Other Findings:  None. IMPRESSION: Extensive deep venous thrombosis is noted from the calf veins to the common femoral vein on the left. Electronically Signed   By: Inez Catalina M.D.   On: 12/30/2020 21:20   DG Fluoro Guide CV Line Left  Result Date: 01/06/2021 CLINICAL DATA:  History of lung carcinoma with indwelling left subclavian vein Port-A-Cath region placed in 2020. Recurrent problems with ability to aspirate blood from the port. EXAM: CONTRAST INJECTION OF PORT A CATH UNDER FLUOROSCOPY CONTRAST:  20 mL Omnipaque 180 FLUOROSCOPY TIME:  48 seconds.  48.3 mGy PROCEDURE: Contrast was administered via the indwelling port after it was accessed.  Fluoroscopic spot images were obtained of the catheter during injection. FINDINGS: The left subclavian port demonstrates stable positioning with the catheter tip located at the level of the mid SVC. With port injection, there is no evidence of contrast extravasation. A very prominent fibrin sheath is present surrounding the distal segment of the catheter at the level of the SVC and brachiocephalic vein. Contrast does not exit the end of the catheter and rather exits perforations in the sheath at the level of the SVC and brachiocephalic vein. Exiting contrast does flow freely in the SVC which shows no evidence of significant underlying stenosis. IMPRESSION: Very prominent occlusive fibrin sheath surrounding the distal segment of the indwelling port catheter in the SVC and brachiocephalic vein. Contrast does not exit the end hole but rather perforations in the fibrin sheath. Exiting contrast does flow freely in the SVC. Electronically Signed   By: Aletta Edouard M.D.   On: 01/06/2021 12:27   ECHOCARDIOGRAM COMPLETE  Result Date: 01/02/2021    ECHOCARDIOGRAM REPORT   Patient Name:   Samuel Choi Date of Exam: 01/01/2021 Medical Rec #:  [id number]      Height:       73.0 in Accession #:    [id number]     Weight:       124.6 lb Date of Birth:  1965/10/07       BSA:          1.759 m Patient Age:    55 years       BP:           144/86 mmHg Patient Gender: M              HR:           99 bpm. Exam Location:  [facility] Procedure: 2D Echo, Cardiac Doppler and Color Doppler Indications:     Ventricular Tachycardia I47.2  History:         Patient has no prior history of Echocardiogram examinations.                  Risk Factors:Hypertension.  Sonographer:     Alyse Low Roar Referring Phys:  [id number] Loletha Grayer Diagnosing Phys: Isaias Cowman MD IMPRESSIONS  1. Left ventricular ejection fraction, by estimation, is 60 to 65%. The left ventricle has normal function. The left ventricle has no regional wall motion  abnormalities. Left ventricular diastolic parameters are consistent with Grade I diastolic dysfunction (impaired relaxation).  2. Right ventricular systolic function is normal. The right ventricular size is normal.  3. The mitral valve is normal in structure. Mild mitral valve regurgitation. No evidence of mitral stenosis.  4. Tricuspid valve regurgitation is moderate to severe.  5. The aortic valve  is normal in structure. Aortic valve regurgitation is not visualized. No aortic stenosis is present.  6. The inferior vena cava is normal in size with greater than 50% respiratory variability, suggesting right atrial pressure of 3 mmHg. FINDINGS  Left Ventricle: Left ventricular ejection fraction, by estimation, is 60 to 65%. The left ventricle has normal function. The left ventricle has no regional wall motion abnormalities. The left ventricular internal cavity size was normal in size. There is  no left ventricular hypertrophy. Left ventricular diastolic parameters are consistent with Grade I diastolic dysfunction (impaired relaxation). Right Ventricle: The right ventricular size is normal. No increase in right ventricular wall thickness. Right ventricular systolic function is normal. Left Atrium: Left atrial size was normal in size. Right Atrium: Right atrial size was normal in size. Pericardium: There is no evidence of pericardial effusion. Mitral Valve: The mitral valve is normal in structure. Mild mitral valve regurgitation. No evidence of mitral valve stenosis. Tricuspid Valve: The tricuspid valve is normal in structure. Tricuspid valve regurgitation is moderate to severe. No evidence of tricuspid stenosis. Aortic Valve: The aortic valve is normal in structure. Aortic valve regurgitation is not visualized. No aortic stenosis is present. Aortic valve peak gradient measures 7.5 mmHg. Pulmonic Valve: The pulmonic valve was normal in structure. Pulmonic valve regurgitation is not visualized. No evidence of pulmonic  stenosis. Aorta: The aortic root is normal in size and structure. Venous: The inferior vena cava is normal in size with greater than 50% respiratory variability, suggesting right atrial pressure of 3 mmHg. IAS/Shunts: No atrial level shunt detected by color flow Doppler.  LEFT VENTRICLE PLAX 2D LVIDd:         4.20 cm  Diastology LVIDs:         2.90 cm  LV e' medial:    4.90 cm/s LV PW:         1.10 cm  LV E/e' medial:  13.3 LV IVS:        1.10 cm  LV e' lateral:   6.09 cm/s LVOT diam:     1.90 cm  LV E/e' lateral: 10.7 LVOT Area:     2.84 cm  RIGHT VENTRICLE RV Mid diam:    4.10 cm RV S prime:     11.10 cm/s TAPSE (M-mode): 1.7 cm LEFT ATRIUM             Index       RIGHT ATRIUM           Index LA diam:        3.10 cm 1.76 cm/m  RA Area:     16.90 cm LA Vol (A2C):   43.7 ml 24.84 ml/m RA Volume:   42.00 ml  23.87 ml/m LA Vol (A4C):   42.4 ml 24.10 ml/m LA Biplane Vol: 44.1 ml 25.07 ml/m  AORTIC VALVE                PULMONIC VALVE AV Area (Vmax): 2.96 cm    PV Vmax:        0.85 m/s AV Vmax:        137.00 cm/s PV Peak grad:   2.9 mmHg AV Peak Grad:   7.5 mmHg    RVOT Peak grad: 1 mmHg LVOT Vmax:      143.00 cm/s  AORTA Ao Root diam: 3.00 cm MITRAL VALVE               TRICUSPID VALVE MV Area (PHT): 3.42 cm    TR Peak  grad:   78.9 mmHg MV Decel Time: 222 msec    TR Vmax:        444.00 cm/s MV E velocity: 65.10 cm/s MV A velocity: 77.60 cm/s  SHUNTS MV E/A ratio:  0.84        Systemic Diam: 1.90 cm MV A Prime:    10.6 cm/s Isaias Cowman MD Electronically signed by Isaias Cowman MD Signature Date/Time: 01/02/2021/10:02:39 AM    Final       ASSESSMENT & PLAN:  1. Encounter for antineoplastic immunotherapy   2. Squamous cell carcinoma of lung, right ([place])   3. Hypokalemia   4. Hypomagnesemia   5. Port-A-Cath in place   6. Acute deep vein thrombosis (DVT) of femoral vein of left lower extremity (H[facility])    #Recurrent Squamous cell carcinoma of lung, stage IV S/p carboplatin Taxol and Keytruda,  status post 4 cycles- partial response  Labs reviewed and discussed with patient. 12/15/2020, CT chest without contrast showed resolution of lung nodules, no evidence of recurrent disease Continue Keytruda today.  #Unprovoked left lower extremity DVT, status post thrombolysis/thrombectomy. Continue Eliquis 5 mg twice daily.  Symptom has improved.   Recommend patient to complete 3 months of anticoagulation then we will switch him to low-dose for prophylaxis.  #Chronic hypomagnesia.  Continue slow magnesium twice daily.   #Chronic hypokalemia, potassium 3.6.  Today.  Continue potassium chloride 20 mEq twice daily.   #Chemotherapy-induced thrombocytopenia, monitor #Port-A-Cath in place, dye study showed fibrin sheath. port was aspirated easily with good blood return.  Possible due to being on anticoagulation.  We will hold off Cathflo at this point.  Monitor.  return of visit:  Follow-up in 3 weeks.   Earlie Server, MD, PhD Hematology Oncology [facility] 01/11/2021

## 2021-01-11 NOTE — Patient Instructions (Signed)
[facility]   Discharge Instructions: Thank you for choosing [facility] to provide your oncology and hematology care.  If you have a lab appointment with the [facility], please go directly to the [facility] and check in at the registration area.  Wear comfortable clothing and clothing appropriate for easy access to any Portacath or PICC line.   We strive to give you quality time with your provider. You may need to reschedule your appointment if you arrive late (15 or more minutes).  Arriving late affects you and other patients whose appointments are after yours.  Also, if you miss three or more appointments without notifying the office, you may be dismissed from the clinic at the provider's discretion.      For prescription refill requests, have your pharmacy contact our office and allow 72 hours for refills to be completed.    Today you received the following chemotherapy and/or immunotherapy agents: Keytruda.      To help prevent nausea and vomiting after your treatment, we encourage you to take your nausea medication as directed.  BELOW ARE SYMPTOMS THAT SHOULD BE REPORTED IMMEDIATELY: *FEVER GREATER THAN 100.4 F (38 C) OR HIGHER *CHILLS OR SWEATING *NAUSEA AND VOMITING THAT IS NOT CONTROLLED WITH YOUR NAUSEA MEDICATION *UNUSUAL SHORTNESS OF BREATH *UNUSUAL BRUISING OR BLEEDING *URINARY PROBLEMS (pain or burning when urinating, or frequent urination) *BOWEL PROBLEMS (unusual diarrhea, constipation, pain near the anus) TENDERNESS IN MOUTH AND THROAT WITH OR WITHOUT PRESENCE OF ULCERS (sore throat, sores in mouth, or a toothache) UNUSUAL RASH, SWELLING OR PAIN  UNUSUAL VAGINAL DISCHARGE OR ITCHING   Items with * indicate a potential emergency and should be followed up as soon as possible or go to the Emergency Department if any problems should occur.  Please show the CHEMOTHERAPY ALERT CARD or IMMUNOTHERAPY ALERT CARD at  check-in to the Emergency Department and triage nurse.  Should you have questions after your visit or need to cancel or reschedule your appointment, please contact [facility]  [phone] and follow the prompts.  Office hours are 8:00 a.m. to 4:30 p.m. Monday - Friday. Please note that voicemails left after 4:00 p.m. may not be returned until the following business day.  We are closed weekends and major holidays. You have access to a nurse at all times for urgent questions. Please call the main number to the clinic [phone] and follow the prompts.  For any non-urgent questions, you may also contact your provider using MyChart. We now offer e-Visits for anyone 26 and older to request care online for non-urgent symptoms. For details visit [email or web address].   Also download the MyChart app! Go to the app store, search "MyChart", open the app, select [facility], and log in with your MyChart username and password.  Due to Covid, a mask is required upon entering the hospital/clinic. If you do not have a mask, one will be given to you upon arrival. For doctor visits, patients may have 1 support person aged 43 or older with them. For treatment visits, patients cannot have anyone with them due to current Covid guidelines and our immunocompromised population.

## 2021-01-11 NOTE — Progress Notes (Signed)
Patient denies new problems/concerns today.   °

## 2021-02-01 ENCOUNTER — Inpatient Hospital Stay: Payer: 59

## 2021-02-01 ENCOUNTER — Other Ambulatory Visit: Payer: Self-pay

## 2021-02-01 ENCOUNTER — Encounter: Payer: Self-pay | Admitting: Oncology

## 2021-02-01 ENCOUNTER — Inpatient Hospital Stay (HOSPITAL_BASED_OUTPATIENT_CLINIC_OR_DEPARTMENT_OTHER): Payer: 59 | Admitting: Oncology

## 2021-02-01 VITALS — BP 132/84 | HR 92 | Temp 98.8°F | Resp 16 | Ht 73.0 in | Wt 136.0 lb

## 2021-02-01 DIAGNOSIS — R059 Cough, unspecified: Secondary | ICD-10-CM

## 2021-02-01 DIAGNOSIS — Z87891 Personal history of nicotine dependence: Secondary | ICD-10-CM

## 2021-02-01 DIAGNOSIS — C3491 Malignant neoplasm of unspecified part of right bronchus or lung: Secondary | ICD-10-CM

## 2021-02-01 DIAGNOSIS — J439 Emphysema, unspecified: Secondary | ICD-10-CM

## 2021-02-01 DIAGNOSIS — E876 Hypokalemia: Secondary | ICD-10-CM

## 2021-02-01 DIAGNOSIS — Z5112 Encounter for antineoplastic immunotherapy: Secondary | ICD-10-CM

## 2021-02-01 DIAGNOSIS — Z95828 Presence of other vascular implants and grafts: Secondary | ICD-10-CM

## 2021-02-01 LAB — CBC WITH DIFFERENTIAL/PLATELET
Abs Immature Granulocytes: 0.05 10*3/uL (ref 0.00–0.07)
Basophils Absolute: 0.1 10*3/uL (ref 0.0–0.1)
Basophils Relative: 1 %
Eosinophils Absolute: 0.1 10*3/uL (ref 0.0–0.5)
Eosinophils Relative: 1 %
HCT: 39.1 % (ref 39.0–52.0)
Hemoglobin: 13.5 g/dL (ref 13.0–17.0)
Immature Granulocytes: 0 %
Lymphocytes Relative: 10 %
Lymphs Abs: 1.2 10*3/uL (ref 0.7–4.0)
MCH: 37.8 pg — ABNORMAL HIGH (ref 26.0–34.0)
MCHC: 34.5 g/dL (ref 30.0–36.0)
MCV: 109.5 fL — ABNORMAL HIGH (ref 80.0–100.0)
Monocytes Absolute: 1.1 10*3/uL — ABNORMAL HIGH (ref 0.1–1.0)
Monocytes Relative: 8 %
Neutro Abs: 10.1 10*3/uL — ABNORMAL HIGH (ref 1.7–7.7)
Neutrophils Relative %: 80 %
Platelets: 167 10*3/uL (ref 150–400)
RBC: 3.57 MIL/uL — ABNORMAL LOW (ref 4.22–5.81)
RDW: 13.3 % (ref 11.5–15.5)
WBC: 12.6 10*3/uL — ABNORMAL HIGH (ref 4.0–10.5)
nRBC: 0.2 % (ref 0.0–0.2)

## 2021-02-01 LAB — COMPREHENSIVE METABOLIC PANEL
ALT: 17 U/L (ref 0–44)
AST: 21 U/L (ref 15–41)
Albumin: 4.1 g/dL (ref 3.5–5.0)
Alkaline Phosphatase: 74 U/L (ref 38–126)
Anion gap: 12 (ref 5–15)
BUN: 17 mg/dL (ref 6–20)
CO2: 24 mmol/L (ref 22–32)
Calcium: 9.6 mg/dL (ref 8.9–10.3)
Chloride: 105 mmol/L (ref 98–111)
Creatinine, Ser: 1.04 mg/dL (ref 0.61–1.24)
GFR, Estimated: >60 mL/min (ref >60)
Glucose, Bld: 96 mg/dL (ref 70–99)
Potassium: 3.9 mmol/L (ref 3.5–5.1)
Sodium: 141 mmol/L (ref 135–145)
Total Bilirubin: 0.6 mg/dL (ref 0.3–1.2)
Total Protein: 7.8 g/dL (ref 6.5–8.1)

## 2021-02-01 LAB — FOLATE: Folate: 23 ng/mL (ref >5.9)

## 2021-02-01 LAB — VITAMIN B12: Vitamin B-12: 2337 pg/mL — ABNORMAL HIGH (ref 180–914)

## 2021-02-01 MED ORDER — SODIUM CHLORIDE 0.9 % IV SOLN
Freq: Once | INTRAVENOUS | Status: AC
  Filled 2021-02-01: qty 250

## 2021-02-01 MED ORDER — SODIUM CHLORIDE 0.9 % IV SOLN
200.0000 mg | Freq: Once | INTRAVENOUS | Status: AC
  Administered 2021-02-01: 200 mg via INTRAVENOUS
  Filled 2021-02-01: qty 8

## 2021-02-01 MED ORDER — BUDESONIDE-FORMOTEROL FUMARATE 80-4.5 MCG/ACT IN AERO: 2.0000 | INHALATION_SPRAY | Freq: Two times a day (BID) | RESPIRATORY_TRACT | 6 refills | Status: DC

## 2021-02-01 MED ORDER — HEPARIN SOD (PORK) LOCK FLUSH 100 UNIT/ML IV SOLN
500.0000 [IU] | Freq: Once | INTRAVENOUS | Status: AC
  Administered 2021-02-01: 500 [IU] via INTRAVENOUS
  Filled 2021-02-01: qty 5

## 2021-02-01 NOTE — Progress Notes (Signed)
Hematology/Oncology Follow up note [facility] Telephone:([phone] [phone]   Patient Care Team: Tracie Harrier, MD as PCP - General (Internal Medicine) Earlie Server, MD as Consulting Physician (Hematology and Oncology)  REASON FOR VISIT:  Follow up for treatment of squamous lung cancer.   HISTORY OF PRESENTING ILLNESS:  # Dec 2019 Stage I squamous lung cancer #s/p Bronchoscopy on 06/25/2018. subcarina EBUS FNA was non diagnostic, hypocellular specimen.  # 08/13/2018 CT guided right upper lobe biopsy pathology showed dense fibrosis and mixed inflammatory cells with prominent polytypic plasma cells component. Focal benign bronchial wall and alveolar spaces. No malignancy was identified.   # 08/13/2018 underwent right thoracotomy and wedge resection of a right upper lobe mass.  Frozen section was consistent with an inflammatory process.  On the second postop day, preliminary pathology reports high-grade malignancy.  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  08/20/2018 Patient was therefore taken back to the OR and underwent take complete lobectomy  pT1b pN0 cM0 stage I squamous cell lung cancer.  Margin is negative.  Recommend observation.    # 12/03/2018 CT chest w contrast showed local recurrence.  12/10/2018 PET showed No evidence of distant metastatic disease # 12/26/2018 s/p bronchoscopy biopsy. Confirmed local recurrence of squamous cell lung cancer.  medi port placed by Dr.Oaks.  # 06/14/2020 CT chest w contrast was reviewed and discussed with patient.  Evidence of progressive lymphangitic spread of tumor throughout the right lung, with contralateral lung nodules with  right pleural effusion  MRI brain is negative for CNS involvement.  CT abdomen with contrast showed no evidence of abdominal metastatic disease PET scan was not approved by insurance-peer to peer appeal with Dr.Vipul Bhanderi   # NGS - KRAS V14L,  TMB 26.6-high, MS stable, PD-L1  5% CDKN2C G48, DICER1c.2117-1G>T, FLT4 G1131S, NF1 A419f, NF1 L2643*, STK11 N1862fCancer treatment Started concurrent chemoradiation on 01/16/2019 Carboplatin AUC of 2 and Taxol 45 mg/m2 weekly finished in 03/05/2019 04/10/2019, patient started on durvalumab maintenance. -CT showed progression.  Insurance denied PET scan evaluation. 07/16/2020-09/28/2020 4 cycles of carboplatin/paclitaxel/Keytruda  10/15/2020, CT chest abdomen pelvis redemonstrated postoperative and postradiation appearance of the right chest with perihilar consolidation and fibrosis.  Slight interval decrease in size of the pulmonary nodules associated with interlobular septal thickening nearly complete resolution of previously seen left-sided pulmonary nodules.  Minimal irregular residual.  Right pleural effusion improved.  Consistent with treatment response.  No evidence of metastatic disease with the abdomen or pelvis.  None obstructive bilateral nephrolithiasis - partial response to the treatment- 10/19/2020, Keytruda monotherapy maintenance  12/15/2020, CT chest without contrast showed resolution of lung nodules, no evidence of recurrent disease 12/30/2020, patient was admitted due to unprovoked extensive deep vein thrombosis from calf vein to the common femoral vein on the left.  Patient was started on heparin drip.  Patient underwent thrombolysis and is a colectomy on 12/31/2020.  Anticoagulation was switched to Eliquis at discharge.  INTERVAL HISTORY Samuel MORAWSKIs a [age].o. male who has above history reviewed by me today presents for reatment of  recurrent  squamous cell lung cancer. Patient has been on Keytruda maintenance. Patient reports no new complaints.  Denies any shortness of breath, leg swelling, active bleeding events.  Chronic cough has improved     Review of Systems  Constitutional:  Negative for appetite change, chills, fatigue, fever and unexpected weight change.  HENT:   Negative for hearing loss and voice  change.   Eyes:  Negative  for eye problems and icterus.  Respiratory:  Positive for cough. Negative for chest tightness and shortness of breath.   Cardiovascular:  Negative for chest pain and leg swelling.  Gastrointestinal:  Negative for abdominal distention, abdominal pain and nausea.  Endocrine: Negative for hot flashes.  Genitourinary:  Negative for difficulty urinating, dysuria and frequency.   Musculoskeletal:  Negative for arthralgias.  Skin:  Negative for itching and rash.  Neurological:  Negative for light-headedness and numbness.  Hematological:  Negative for adenopathy. Does not bruise/bleed easily.  Psychiatric/Behavioral:  Negative for confusion.    MEDICAL HISTORY:  Past Medical History:  Diagnosis Date   Alcohol abuse    usually drinks 2-3 drinks per day   Atherosclerosis 06/2018   Chronic sinusitis    Dehydration 02/07/2019   Emphysema of lung ([place]) 06/2018   patient unaware of this.   Hip fracture ([place]) 05/2018   no surgery   History of kidney stones 05/2018   per xray, bilateral nephrolitiasis   Hypertension    Squamous cell carcinoma of lung, right ([place]) 06/2018    SURGICAL HISTORY: Past Surgical History:  Procedure Laterality Date   BRAIN SURGERY  10/2017   nasal/sinus endoscopy. mass benign   ELECTROMAGNETIC NAVIGATION BROCHOSCOPY Right 06/25/2018   Procedure: ELECTROMAGNETIC NAVIGATION BRONCHOSCOPY;  Surgeon: [name], MD;  Location: [facility] OR[facility];  Service: Cardiopulmonary;  Laterality: Right;   ENDOBRONCHIAL ULTRASOUND Right 06/25/2018   Procedure: ENDOBRONCHIAL ULTRASOUND;  Surgeon: [name], MD;  Location: [facility];  Service: Cardiopulmonary;  Laterality: Right;   ENDOBRONCHIAL ULTRASOUND Right 12/26/2018   Procedure: ENDOBRONCHIAL ULTRASOUND RIGHT;  Surgeon: [name], MD;  Location: [facility];  Service: Cardiopulmonary;  Laterality: Right;   FLEXIBLE BRONCHOSCOPY N/A 08/20/2018   Procedure: FLEXIBLE BRONCHOSCOPY PREOP;  Surgeon: [name],  MD;  Location: [facility] OR[facility];  Service: Thoracic;  Laterality: N/A;   IR CV LINE INJECTION  04/04/2019   NASAL SINUS SURGERY  10/2017   At [facility], frontal sinusotomy, ethmoidectomy, resection anterior cranial fossa neoplasm, turbinate resection   PERIPHERAL VASCULAR THROMBECTOMY Left 12/31/2020   Procedure: PERIPHERAL VASCULAR THROMBECTOMY;  Surgeon: Algernon Huxley, MD;  Location: [place] [facility];  Service: Cardiovascular;  Laterality: Left;   PORTACATH PLACEMENT Left 01/15/2019   Procedure: INSERTION PORT-A-CATH;  Surgeon: [name], MD;  Location: [facility];  Service: General;  Laterality: Left;   THORACOTOMY Right 08/13/2018   Procedure: PREOP BROCHOSCOPY WITH RIGHT THORACOTOMY AND RUL RESECTION;  Surgeon: [name], MD;  Location: [facility];  Service: General;  Laterality: Right;   THORACOTOMY Right 08/20/2018   Procedure: THORACOTOMY MAJOR RIGHT UPPER LOBE LOBECTOMY;  Surgeon: [name], MD;  Location: [facility];  Service: Thoracic;  Laterality: Right;   TOE SURGERY Left    pin in left toe    SOCIAL HISTORY: Social History   Socioeconomic History   Marital status: Married    Spouse name: lisa   Number of children: Not on file   Years of education: Not on file   Highest education level: Not on file  Occupational History   Occupation: welding    Comment: taking time off to resolve issues  Tobacco Use   Smoking status: Former    Packs/day: 0.50    Years: 15.00    Pack years: 7.50    Types: Cigarettes    Quit date: 08/2018    Years since quitting: 2.4   Smokeless tobacco: Never  Vaping Use   Vaping Use: Never used  Substance and Sexual Activity  Alcohol use: Yes    Alcohol/week: 3.0 standard drinks    Types: 3 Cans of beer per week    Comment: usually 2 drinks per week, per patient   Drug use: No   Sexual activity: Not on file  Other Topics Concern   Not on file  Social History Narrative   Not on file   Social Determinants of Health   Financial Resource Strain: Not  on file  Food Insecurity: Not on file  Transportation Needs: Not on file  Physical Activity: Not on file  Stress: Not on file  Social Connections: Not on file  Intimate Partner Violence: Not on file    FAMILY HISTORY: Family History  Problem Relation Age of Onset   Breast cancer Mother    Diabetes Mother    Lung cancer Father    Hypertension Father     ALLERGIES:  has No Known Allergies.  MEDICATIONS:  Current Outpatient Medications  Medication Sig Dispense Refill   albuterol (VENTOLIN HFA) 108 (90 Base) MCG/ACT inhaler TAKE 2 PUFFS BY MOUTH EVERY 6 HOURS AS NEEDED FOR WHEEZE OR SHORTNESS OF BREATH 6.7 each 6   apixaban (ELIQUIS) 5 MG TABS tablet Two tabs po twice a day for seven days then one tab po twice a day afterwards (Patient taking differently: 5 mg 2 (two) times daily.) 74 tablet 0   diltiazem (CARDIZEM CD) 120 MG 24 hr capsule Take 120 mg by mouth daily.     folic acid (V-R FOLIC ACID) 128 MCG tablet Take 1 tablet (400 mcg total) by mouth daily. 90 tablet 1   HYDROcodone-acetaminophen (NORCO/VICODIN) 5-325 MG tablet Take 1 tablet by mouth every 6 (six) hours as needed for severe pain. 10 tablet 0   loratadine (CLARITIN) 10 MG tablet Take 10 mg by mouth daily.     magnesium chloride (SLOW-MAG) 64 MG TBEC SR tablet Take 1 tablet (64 mg total) by mouth 2 (two) times daily. TAKE 1 TABLET (64 MG TOTAL) BY MOUTH DAILY. 60 tablet 1   methocarbamol (ROBAXIN) 500 MG tablet Take 1 tablet (500 mg total) by mouth at bedtime. 30 tablet 0   metoprolol succinate (TOPROL-XL) 25 MG 24 hr tablet Take 1 tablet (25 mg total) by mouth daily. 30 tablet 0   olmesartan (BENICAR) 40 MG tablet Take 40 mg by mouth every other day.      ondansetron (ZOFRAN) 8 MG tablet Take 1 tablet (8 mg total) by mouth every 8 (eight) hours as needed for refractory nausea / vomiting. Start on day 3 after chemo. 90 tablet 3   prochlorperazine (COMPAZINE) 10 MG tablet Take 1 tablet (10 mg total) by mouth every 6 (six)  hours as needed (Nausea or vomiting). 90 tablet 3   SPIRIVA HANDIHALER 18 MCG inhalation capsule INHALE 1 CAPSULE VIA HANDIHALER ONCE DAILY AT THE SAME TIME EVERY DAY 30 capsule 0   vitamin B-12 (CYANOCOBALAMIN) 1000 MCG tablet Take 1 tablet (1,000 mcg total) by mouth daily. 90 tablet 1   budesonide-formoterol (SYMBICORT) 80-4.5 MCG/ACT inhaler Inhale 2 puffs into the lungs 2 (two) times daily. in the morning and at bedtime. 10.2 each 6   hydrALAZINE (APRESOLINE) 100 MG tablet Take 50 mg by mouth 2 (two) times daily.  (Patient not taking: No sig reported)     No current facility-administered medications for this visit.   Facility-Administered Medications Ordered in Other Visits  Medication Dose Route Frequency Provider Last Rate Last Admin   albuterol (PROVENTIL) (2.5 MG/3ML) 0.083% nebulizer solution  2.5 mg  2.5 mg Nebulization Once System, Provider Not In       heparin lock flush 100 unit/mL  500 Units Intravenous Once Earlie Server, MD       sodium chloride flush (NS) 0.9 % injection 10 mL  10 mL Intravenous PRN Earlie Server, MD   10 mL at 04/04/19 0903   sodium chloride flush (NS) 0.9 % injection 10 mL  10 mL Intravenous PRN Earlie Server, MD   10 mL at 07/26/20 1308     PHYSICAL EXAMINATION: ECOG PERFORMANCE STATUS: 1 - Symptomatic but completely ambulatory Vitals:   02/01/21 0934  BP: 132/84  Pulse: 92  Resp: 16  Temp: 98.8 F (37.1 C)  SpO2: 100%   Filed Weights   02/01/21 0934  Weight: 136 lb (61.7 kg)    Physical Exam Constitutional:      General: He is not in acute distress. HENT:     Head: Normocephalic and atraumatic.  Eyes:     General: No scleral icterus. Cardiovascular:     Rate and Rhythm: Normal rate and regular rhythm.     Heart sounds: Normal heart sounds.  Pulmonary:     Effort: Pulmonary effort is normal. No respiratory distress.     Breath sounds: No wheezing.  Abdominal:     General: Bowel sounds are normal. There is no distension.     Palpations: Abdomen is  soft.  Musculoskeletal:        General: No deformity. Normal range of motion.     Cervical back: Normal range of motion and neck supple.  Skin:    General: Skin is warm and dry.     Findings: No erythema or rash.  Neurological:     Mental Status: He is alert and oriented to person, place, and time. Mental status is at baseline.     Cranial Nerves: No cranial nerve deficit.     Coordination: Coordination normal.  Psychiatric:        Mood and Affect: Mood normal.     LABORATORY DATA:  I have reviewed the data as listed Lab Results  Component Value Date   WBC 12.6 (H) 02/01/2021   HGB 13.5 02/01/2021   HCT 39.1 02/01/2021   MCV 109.5 (H) 02/01/2021   PLT 167 02/01/2021   Recent Labs    03/17/20 0830 03/31/20 0825 04/07/20 0854 04/22/20 0825 08/31/20 0812 09/07/20 1117 12/31/20 0514 01/01/21 0450 01/11/21 0816 02/01/21 0915  NA 134* 137 137   < > 137   < > 137 134* 135 141  K 4.6 3.9 3.7   < > 3.1*   < > 4.1 3.6 3.5 3.9  CL 101 102 104   < > 100   < > 102 101 99 105  CO2 20* 23 21*   < > 25   < > _0 GLUCOSE 167* 126* 163*   < > 107*   < > 100* 94 108* 96  BUN _1 < > 7   < > _2 CREATININE 1.19 1.10 1.10   < > 0.79   < > 0.91 0.91 1.21 1.04  CALCIUM 9.0 9.1 8.8*   < > 8.9   < > 9.2 8.8* 9.3 9.6  GFRNONAA >60 >60 >60   < > >60   < > >60 >60 >60 >60  GFRAA >60 >60 >60  --   --   --   --   --   --   --  PROT 7.7 7.6 7.1   < > 7.4   < > 7.7  --  7.6 7.8  ALBUMIN 3.5 3.5 3.4*   < > 3.4*   < > 3.5  --  3.6 4.1  AST 32 29 25   < > 31   < > 21  --  23 21  ALT _0 < > 13   < > 16  --  16 17  ALKPHOS 86 87 76   < > 101   < > 91  --  81 74  BILITOT 0.8 0.7 0.5   < > 2.0*   < > 0.9  --  0.7 0.6  BILIDIR  --   --   --   --  0.4*  --   --   --   --   --    < > = values in this interval not displayed.    Iron/TIBC/Ferritin/ %Sat No results found for: IRON, TIBC, FERRITIN, IRONPCTSAT   RADIOGRAPHIC STUDIES: I have personally reviewed the  radiological images as listed and agreed with the findings in the report. CT Chest Wo Contrast  Result Date: 12/16/2020 CLINICAL DATA:  Lung cancer surveillance. EXAM: CT CHEST WITHOUT CONTRAST TECHNIQUE: Multidetector CT imaging of the chest was performed following the standard protocol without IV contrast. COMPARISON:  Multiple prior examinations. The most recent is 10/15/2020. FINDINGS: Cardiovascular: The heart is normal in size. No pericardial effusion. Stable tortuosity and calcification of the thoracic aorta and stable scattered coronary artery calcifications. Mediastinum/Nodes: No mediastinal or hilar mass or overt adenopathy. The esophagus is grossly. The left-sided Port-A-Cath is in good position, unchanged. Lungs/Pleura: Stable underlying emphysematous changes. Stable postoperative and post radiation changes involving the right hilum and right paramediastinal lung with dense radiation fibrosis associated bronchiectasis. I do not see any findings suspicious for recurrent tumor. No worrisome pulmonary nodules to suggest pulmonary metastatic disease. Small pulmonary nodules identified on more remote chest CT scans have resolved. No new pulmonary lesions. No pleural effusions or pleural lesions. Upper Abdomen: No significant upper abdominal findings. No obvious hepatic or adrenal gland lesions. Stable aortic calcifications and bilateral renal calculi. Musculoskeletal: No chest wall mass, supraclavicular or axillary adenopathy. The bony thorax is intact. IMPRESSION: 1. Stable postoperative and post radiation changes involving the right hilum and right paramediastinal lung. No findings suspicious for recurrent tumor. 2. No mediastinal or hilar mass or adenopathy. 3. Stable emphysematous changes. 4. Stable atherosclerotic calcifications involving the aorta and coronary arteries. 5. Emphysema and aortic atherosclerosis. 6. Small pulmonary nodules identified on more remote chest CT scans have resolved. No new or  worrisome pulmonary lesions. Aortic Atherosclerosis (ICD10-I70.0) and Emphysema (ICD10-J43.9). Electronically Signed   By: Marijo Sanes M.D.   On: 12/16/2020 11:44   PERIPHERAL VASCULAR CATHETERIZATION  Result Date: 12/31/2020 See surgical note for result.  US Venous Img Lower Unilateral Left  Result Date: 12/30/2020 CLINICAL DATA:  Left leg swelling EXAM: LEFT LOWER EXTREMITY VENOUS DOPPLER ULTRASOUND TECHNIQUE: Gray-scale sonography with graded compression, as well as color Doppler and duplex ultrasound were performed to evaluate the lower extremity deep venous systems from the level of the common femoral vein and including the common femoral, femoral, profunda femoral, popliteal and calf veins including the posterior tibial, peroneal and gastrocnemius veins when visible. The superficial great saphenous vein was also interrogated. Spectral Doppler was utilized to evaluate flow at rest and with distal augmentation maneuvers in the common femoral, femoral and popliteal veins. COMPARISON:  None. FINDINGS: Contralateral Common Femoral Vein: Respiratory phasicity is normal and symmetric with the symptomatic side. No evidence of thrombus. Normal compressibility. Common Femoral Vein: Thrombus is noted with decreased compressibility. The thrombus is occlusive in nature. Saphenofemoral Junction: No evidence of thrombus. Normal compressibility and flow on color Doppler imaging. Profunda Femoral Vein: Thrombus is noted with decreased compressibility. Femoral Vein: Thrombus is noted with decreased compressibility. Popliteal Vein: Thrombus is noted with decreased compressibility. Calf Veins: Thrombus is noted with decreased compressibility. Superficial Great Saphenous Vein: No evidence of thrombus. Normal compressibility. Venous Reflux:  None. Other Findings:  None. IMPRESSION: Extensive deep venous thrombosis is noted from the calf veins to the common femoral vein on the left. Electronically Signed   By: Inez Catalina  M.D.   On: 12/30/2020 21:20   DG Fluoro Guide CV Line Left  Result Date: 01/06/2021 CLINICAL DATA:  History of lung carcinoma with indwelling left subclavian vein Port-A-Cath region placed in 2020. Recurrent problems with ability to aspirate blood from the port. EXAM: CONTRAST INJECTION OF PORT A CATH UNDER FLUOROSCOPY CONTRAST:  20 mL Omnipaque 180 FLUOROSCOPY TIME:  48 seconds.  48.3 mGy PROCEDURE: Contrast was administered via the indwelling port after it was accessed. Fluoroscopic spot images were obtained of the catheter during injection. FINDINGS: The left subclavian port demonstrates stable positioning with the catheter tip located at the level of the mid SVC. With port injection, there is no evidence of contrast extravasation. A very prominent fibrin sheath is present surrounding the distal segment of the catheter at the level of the SVC and brachiocephalic vein. Contrast does not exit the end of the catheter and rather exits perforations in the sheath at the level of the SVC and brachiocephalic vein. Exiting contrast does flow freely in the SVC which shows no evidence of significant underlying stenosis. IMPRESSION: Very prominent occlusive fibrin sheath surrounding the distal segment of the indwelling port catheter in the SVC and brachiocephalic vein. Contrast does not exit the end hole but rather perforations in the fibrin sheath. Exiting contrast does flow freely in the SVC. Electronically Signed   By: Aletta Edouard M.D.   On: 01/06/2021 12:27   ECHOCARDIOGRAM COMPLETE  Result Date: 01/02/2021    ECHOCARDIOGRAM REPORT   Patient Name:   BRYSYN BRANDENBERGER Date of Exam: 01/01/2021 Medical Rec #:  [id number]      Height:       73.0 in Accession #:    [id number]     Weight:       124.6 lb Date of Birth:  31-Aug-1965       BSA:          1.759 m Patient Age:    34 years       BP:           144/86 mmHg Patient Gender: M              HR:           99 bpm. Exam Location:  [facility] Procedure: 2D Echo, Cardiac Doppler  and Color Doppler Indications:     Ventricular Tachycardia I47.2  History:         Patient has no prior history of Echocardiogram examinations.                  Risk Factors:Hypertension.  Sonographer:     Alyse Low Roar Referring Phys:  [id number] Loletha Grayer Diagnosing Phys: Isaias Cowman MD IMPRESSIONS  1. Left ventricular ejection fraction, by estimation, is  60 to 65%. The left ventricle has normal function. The left ventricle has no regional wall motion abnormalities. Left ventricular diastolic parameters are consistent with Grade I diastolic dysfunction (impaired relaxation).  2. Right ventricular systolic function is normal. The right ventricular size is normal.  3. The mitral valve is normal in structure. Mild mitral valve regurgitation. No evidence of mitral stenosis.  4. Tricuspid valve regurgitation is moderate to severe.  5. The aortic valve is normal in structure. Aortic valve regurgitation is not visualized. No aortic stenosis is present.  6. The inferior vena cava is normal in size with greater than 50% respiratory variability, suggesting right atrial pressure of 3 mmHg. FINDINGS  Left Ventricle: Left ventricular ejection fraction, by estimation, is 60 to 65%. The left ventricle has normal function. The left ventricle has no regional wall motion abnormalities. The left ventricular internal cavity size was normal in size. There is  no left ventricular hypertrophy. Left ventricular diastolic parameters are consistent with Grade I diastolic dysfunction (impaired relaxation). Right Ventricle: The right ventricular size is normal. No increase in right ventricular wall thickness. Right ventricular systolic function is normal. Left Atrium: Left atrial size was normal in size. Right Atrium: Right atrial size was normal in size. Pericardium: There is no evidence of pericardial effusion. Mitral Valve: The mitral valve is normal in structure. Mild mitral valve regurgitation. No evidence of mitral valve  stenosis. Tricuspid Valve: The tricuspid valve is normal in structure. Tricuspid valve regurgitation is moderate to severe. No evidence of tricuspid stenosis. Aortic Valve: The aortic valve is normal in structure. Aortic valve regurgitation is not visualized. No aortic stenosis is present. Aortic valve peak gradient measures 7.5 mmHg. Pulmonic Valve: The pulmonic valve was normal in structure. Pulmonic valve regurgitation is not visualized. No evidence of pulmonic stenosis. Aorta: The aortic root is normal in size and structure. Venous: The inferior vena cava is normal in size with greater than 50% respiratory variability, suggesting right atrial pressure of 3 mmHg. IAS/Shunts: No atrial level shunt detected by color flow Doppler.  LEFT VENTRICLE PLAX 2D LVIDd:         4.20 cm  Diastology LVIDs:         2.90 cm  LV e' medial:    4.90 cm/s LV PW:         1.10 cm  LV E/e' medial:  13.3 LV IVS:        1.10 cm  LV e' lateral:   6.09 cm/s LVOT diam:     1.90 cm  LV E/e' lateral: 10.7 LVOT Area:     2.84 cm  RIGHT VENTRICLE RV Mid diam:    4.10 cm RV S prime:     11.10 cm/s TAPSE (M-mode): 1.7 cm LEFT ATRIUM             Index       RIGHT ATRIUM           Index LA diam:        3.10 cm 1.76 cm/m  RA Area:     16.90 cm LA Vol (A2C):   43.7 ml 24.84 ml/m RA Volume:   42.00 ml  23.87 ml/m LA Vol (A4C):   42.4 ml 24.10 ml/m LA Biplane Vol: 44.1 ml 25.07 ml/m  AORTIC VALVE                PULMONIC VALVE AV Area (Vmax): 2.96 cm    PV Vmax:        0.85 m/s  AV Vmax:        137.00 cm/s PV Peak grad:   2.9 mmHg AV Peak Grad:   7.5 mmHg    RVOT Peak grad: 1 mmHg LVOT Vmax:      143.00 cm/s  AORTA Ao Root diam: 3.00 cm MITRAL VALVE               TRICUSPID VALVE MV Area (PHT): 3.42 cm    TR Peak grad:   78.9 mmHg MV Decel Time: 222 msec    TR Vmax:        444.00 cm/s MV E velocity: 65.10 cm/s MV A velocity: 77.60 cm/s  SHUNTS MV E/A ratio:  0.84        Systemic Diam: 1.90 cm MV A Prime:    10.6 cm/s Isaias Cowman MD  Electronically signed by Isaias Cowman MD Signature Date/Time: 01/02/2021/10:02:39 AM    Final       ASSESSMENT & PLAN:  1. Squamous cell carcinoma of lung, right ([place])   2. Hypomagnesemia   3. Encounter for antineoplastic immunotherapy   4. Hypokalemia   5. Pulmonary emphysema, unspecified emphysema type ([place])    #Recurrent Squamous cell carcinoma of lung, stage IV S/p carboplatin Taxol and Keytruda, status post 4 cycles- partial response  Labs reviewed and discussed with patient .  Proceed with Keytruda treatment today.  #Unprovoked left lower extremity DVT, status post thrombolysis/thrombectomy. Continue Eliquis 5 mg twice daily.    Recommend patient to complete 3 months of anticoagulation then we will switch him to low-dose for prophylaxis.  #Chronic hypomagnesia.  Continue slow magnesium twice daily.   #Chronic hypokalemia, improved.  Potassium 3.9.  Today.  Continue potassium chloride 20 mEq twice daily.   #Port-A-Cath in place, dye study showed fibrin sheath.  Currently Mediport was aspirated easily with good blood return.  Probably due to anticoagulation. #COPD/emphysema, continue Symbicort as instructed and albuterol as needed for wheezing.  return of visit:  Follow-up in 3 weeks.   Earlie Server, MD, PhD Hematology Oncology [facility] 02/01/2021

## 2021-02-01 NOTE — Patient Instructions (Signed)
[facility] ONCOLOG[facility]  Discharge Instructions: Thank you for choosing [facility] to provide your oncology and hematology care.  If you have a lab appointment with the [facility], please go directly to the [facility] and check in at the registration area.  Wear comfortable clothing and clothing appropriate for easy access to any Portacath or PICC line.   We strive to give you quality time with your provider. You may need to reschedule your appointment if you arrive late (15 or more minutes).  Arriving late affects you and other patients whose appointments are after yours.  Also, if you miss three or more appointments without notifying the office, you may be dismissed from the clinic at the provider's discretion.      For prescription refill requests, have your pharmacy contact our office and allow 72 hours for refills to be completed.    Today you received the following chemotherapy and/or immunotherapy agents KEYTRUDA      To help prevent nausea and vomiting after your treatment, we encourage you to take your nausea medication as directed.  BELOW ARE SYMPTOMS THAT SHOULD BE REPORTED IMMEDIATELY: *FEVER GREATER THAN 100.4 F (38 C) OR HIGHER *CHILLS OR SWEATING *NAUSEA AND VOMITING THAT IS NOT CONTROLLED WITH YOUR NAUSEA MEDICATION *UNUSUAL SHORTNESS OF BREATH *UNUSUAL BRUISING OR BLEEDING *URINARY PROBLEMS (pain or burning when urinating, or frequent urination) *BOWEL PROBLEMS (unusual diarrhea, constipation, pain near the anus) TENDERNESS IN MOUTH AND THROAT WITH OR WITHOUT PRESENCE OF ULCERS (sore throat, sores in mouth, or a toothache) UNUSUAL RASH, SWELLING OR PAIN  UNUSUAL VAGINAL DISCHARGE OR ITCHING   Items with * indicate a potential emergency and should be followed up as soon as possible or go to the Emergency Department if any problems should occur.  Please show the CHEMOTHERAPY ALERT CARD or IMMUNOTHERAPY ALERT CARD at check-in to  the Emergency Department and triage nurse.  Should you have questions after your visit or need to cancel or reschedule your appointment, please contact [facility]  [phone] and follow the prompts.  Office hours are 8:00 a.m. to 4:30 p.m. Monday - Friday. Please note that voicemails left after 4:00 p.m. may not be returned until the following business day.  We are closed weekends and major holidays. You have access to a nurse at all times for urgent questions. Please call the main number to the clinic [phone] and follow the prompts.  For any non-urgent questions, you may also contact your provider using MyChart. We now offer e-Visits for anyone 62 and older to request care online for non-urgent symptoms. For details visit [email or web address].   Also download the MyChart app! Go to the app store, search "MyChart", open the app, select [facility], and log in with your MyChart username and password.  Due to Covid, a mask is required upon entering the hospital/clinic. If you do not have a mask, one will be given to you upon arrival. For doctor visits, patients may have 1 support person aged 55 or older with them. For treatment visits, patients cannot have anyone with them due to current Covid guidelines and our immunocompromised population.   Pembrolizumab injection What is this medication? PEMBROLIZUMAB (pem broe liz ue mab) is a monoclonal antibody. It is used totreat certain types of cancer. This medicine may be used for other purposes; ask your health care provider orpharmacist if you have questions. COMMON BRAND NAME(S): Keytruda What should I tell my care team before I  take this medication? They need to know if you have any of these conditions: autoimmune diseases like Crohn's disease, ulcerative colitis, or lupus have had or planning to have an allogeneic stem cell transplant (uses someone else's stem cells) history of organ transplant history  of chest radiation nervous system problems like myasthenia gravis or Guillain-Barre syndrome an unusual or allergic reaction to pembrolizumab, other medicines, foods, dyes, or preservatives pregnant or trying to get pregnant breast-feeding How should I use this medication? This medicine is for infusion into a vein. It is given by a health careprofessional in a hospital or clinic setting. A special MedGuide will be given to you before each treatment. Be sure to readthis information carefully each time. Talk to your pediatrician regarding the use of this medicine in children. While this drug may be prescribed for children as young as 6 months for selectedconditions, precautions do apply. Overdosage: If you think you have taken too much of this medicine contact apoison control center or emergency room at once. NOTE: This medicine is only for you. Do not share this medicine with others. What if I miss a dose? It is important not to miss your dose. Call your doctor or health careprofessional if you are unable to keep an appointment. What may interact with this medication? Interactions have not been studied. This list may not describe all possible interactions. Give your health care provider a list of all the medicines, herbs, non-prescription drugs, or dietary supplements you use. Also tell them if you smoke, drink alcohol, or use illegaldrugs. Some items may interact with your medicine. What should I watch for while using this medication? Your condition will be monitored carefully while you are receiving thismedicine. You may need blood work done while you are taking this medicine. Do not become pregnant while taking this medicine or for 4 months after stopping it. Women should inform their doctor if they wish to become pregnant or think they might be pregnant. There is a potential for serious side effects to an unborn child. Talk to your health care professional or pharmacist for more information. Do  not breast-feed an infant while taking this medicine orfor 4 months after the last dose. What side effects may I notice from receiving this medication? Side effects that you should report to your doctor or health care professionalas soon as possible: allergic reactions like skin rash, itching or hives, swelling of the face, lips, or tongue bloody or black, tarry breathing problems changes in vision chest pain chills confusion constipation cough diarrhea dizziness or feeling faint or lightheaded fast or irregular heartbeat fever flushing joint pain low blood counts - this medicine may decrease the number of white blood cells, red blood cells and platelets. You may be at increased risk for infections and bleeding. muscle pain muscle weakness pain, tingling, numbness in the hands or feet persistent headache redness, blistering, peeling or loosening of the skin, including inside the mouth signs and symptoms of high blood sugar such as dizziness; dry mouth; dry skin; fruity breath; nausea; stomach pain; increased hunger or thirst; increased urination signs and symptoms of kidney injury like trouble passing urine or change in the amount of urine signs and symptoms of liver injury like dark urine, light-colored stools, loss of appetite, nausea, right upper belly pain, yellowing of the eyes or skin sweating swollen lymph nodes weight loss Side effects that usually do not require medical attention (report to yourdoctor or health care professional if they continue or are bothersome): decreased appetite hair  loss tiredness This list may not describe all possible side effects. Call your doctor for medical advice about side effects. You may report side effects to FDA at1-800-FDA-1088. Where should I keep my medication? This drug is given in a hospital or clinic and will not be stored at home. NOTE: This sheet is a summary. It may not cover all possible information. If you have questions about  this medicine, talk to your doctor, pharmacist, orhealth care provider.  2022 Elsevier/Gold Standard (2019-05-28 21:44:53)

## 2021-02-18 ENCOUNTER — Encounter: Payer: Self-pay | Admitting: Oncology

## 2021-02-22 ENCOUNTER — Other Ambulatory Visit: Payer: Self-pay

## 2021-02-22 ENCOUNTER — Inpatient Hospital Stay: Payer: 59

## 2021-02-22 ENCOUNTER — Inpatient Hospital Stay: Payer: 59 | Attending: Oncology

## 2021-02-22 ENCOUNTER — Encounter: Payer: Self-pay | Admitting: Oncology

## 2021-02-22 ENCOUNTER — Inpatient Hospital Stay (HOSPITAL_BASED_OUTPATIENT_CLINIC_OR_DEPARTMENT_OTHER): Payer: 59 | Admitting: Oncology

## 2021-02-22 VITALS — BP 149/92 | HR 95 | Temp 98.2°F | Resp 16 | Ht 73.0 in | Wt 137.5 lb

## 2021-02-22 DIAGNOSIS — Z7901 Long term (current) use of anticoagulants: Secondary | ICD-10-CM | POA: Diagnosis not present

## 2021-02-22 DIAGNOSIS — C3491 Malignant neoplasm of unspecified part of right bronchus or lung: Secondary | ICD-10-CM | POA: Diagnosis not present

## 2021-02-22 DIAGNOSIS — I82412 Acute embolism and thrombosis of left femoral vein: Secondary | ICD-10-CM | POA: Diagnosis not present

## 2021-02-22 DIAGNOSIS — E876 Hypokalemia: Secondary | ICD-10-CM

## 2021-02-22 DIAGNOSIS — I119 Hypertensive heart disease without heart failure: Secondary | ICD-10-CM | POA: Diagnosis not present

## 2021-02-22 DIAGNOSIS — Z9221 Personal history of antineoplastic chemotherapy: Secondary | ICD-10-CM | POA: Insufficient documentation

## 2021-02-22 DIAGNOSIS — R059 Cough, unspecified: Secondary | ICD-10-CM

## 2021-02-22 DIAGNOSIS — Z7951 Long term (current) use of inhaled steroids: Secondary | ICD-10-CM | POA: Insufficient documentation

## 2021-02-22 DIAGNOSIS — Z5112 Encounter for antineoplastic immunotherapy: Secondary | ICD-10-CM | POA: Insufficient documentation

## 2021-02-22 DIAGNOSIS — Z79899 Other long term (current) drug therapy: Secondary | ICD-10-CM | POA: Insufficient documentation

## 2021-02-22 DIAGNOSIS — Z923 Personal history of irradiation: Secondary | ICD-10-CM | POA: Insufficient documentation

## 2021-02-22 DIAGNOSIS — C3411 Malignant neoplasm of upper lobe, right bronchus or lung: Secondary | ICD-10-CM | POA: Diagnosis present

## 2021-02-22 LAB — CBC WITH DIFFERENTIAL/PLATELET
Abs Immature Granulocytes: 0.04 10*3/uL (ref 0.00–0.07)
Basophils Absolute: 0.1 10*3/uL (ref 0.0–0.1)
Basophils Relative: 1 %
Eosinophils Absolute: 0.4 10*3/uL (ref 0.0–0.5)
Eosinophils Relative: 3 %
HCT: 39.7 % (ref 39.0–52.0)
Hemoglobin: 14.1 g/dL (ref 13.0–17.0)
Immature Granulocytes: 0 %
Lymphocytes Relative: 9 %
Lymphs Abs: 1.2 10*3/uL (ref 0.7–4.0)
MCH: 39 pg — ABNORMAL HIGH (ref 26.0–34.0)
MCHC: 35.5 g/dL (ref 30.0–36.0)
MCV: 109.7 fL — ABNORMAL HIGH (ref 80.0–100.0)
Monocytes Absolute: 1.2 10*3/uL — ABNORMAL HIGH (ref 0.1–1.0)
Monocytes Relative: 9 %
Neutro Abs: 10.2 10*3/uL — ABNORMAL HIGH (ref 1.7–7.7)
Neutrophils Relative %: 78 %
Platelets: 141 10*3/uL — ABNORMAL LOW (ref 150–400)
RBC: 3.62 MIL/uL — ABNORMAL LOW (ref 4.22–5.81)
RDW: 13.2 % (ref 11.5–15.5)
WBC: 13 10*3/uL — ABNORMAL HIGH (ref 4.0–10.5)
nRBC: 0 % (ref 0.0–0.2)

## 2021-02-22 LAB — COMPREHENSIVE METABOLIC PANEL
ALT: 15 U/L (ref 0–44)
AST: 21 U/L (ref 15–41)
Albumin: 3.6 g/dL (ref 3.5–5.0)
Alkaline Phosphatase: 84 U/L (ref 38–126)
Anion gap: 10 (ref 5–15)
BUN: 12 mg/dL (ref 6–20)
CO2: 29 mmol/L (ref 22–32)
Calcium: 9.5 mg/dL (ref 8.9–10.3)
Chloride: 101 mmol/L (ref 98–111)
Creatinine, Ser: 1.01 mg/dL (ref 0.61–1.24)
GFR, Estimated: >60 mL/min (ref >60)
Glucose, Bld: 125 mg/dL — ABNORMAL HIGH (ref 70–99)
Potassium: 3.6 mmol/L (ref 3.5–5.1)
Sodium: 140 mmol/L (ref 135–145)
Total Bilirubin: 1.1 mg/dL (ref 0.3–1.2)
Total Protein: 7.3 g/dL (ref 6.5–8.1)

## 2021-02-22 MED ORDER — SODIUM CHLORIDE 0.9% FLUSH
10.0000 mL | Freq: Once | INTRAVENOUS | Status: AC
  Administered 2021-02-22: 10 mL via INTRAVENOUS
  Filled 2021-02-22: qty 10

## 2021-02-22 MED ORDER — SODIUM CHLORIDE 0.9 % IV SOLN
Freq: Once | INTRAVENOUS | Status: AC
  Filled 2021-02-22: qty 250

## 2021-02-22 MED ORDER — HEPARIN SOD (PORK) LOCK FLUSH 100 UNIT/ML IV SOLN
500.0000 [IU] | Freq: Once | INTRAVENOUS | Status: AC
  Administered 2021-02-22: 500 [IU] via INTRAVENOUS
  Filled 2021-02-22: qty 5

## 2021-02-22 MED ORDER — SODIUM CHLORIDE 0.9 % IV SOLN
200.0000 mg | Freq: Once | INTRAVENOUS | Status: AC
  Administered 2021-02-22: 200 mg via INTRAVENOUS
  Filled 2021-02-22: qty 8

## 2021-02-22 NOTE — Progress Notes (Signed)
Nutrition Follow-up:   Patient with stage IV squamous cell lung cancer.  Patient receiving [place].    Met with patient during infusion.  Patient reports appetite is the same.  Continues to drink about 2 ensure per day.  Denies nutrition impact symptoms or any concerns about nutrition.     Medications: reviewed  Labs: glucose 125  Anthropometrics:   Weight 137 lb 8 oz today  138 lb on 6/14  135 lb on 5/24 136 lb on 4/12 137 lb on 3/21  Overall stable weight   NUTRITION DIAGNOSIS: Inadequate oral intake improved/stable   INTERVENTION:  Continue oral nutrition supplements RD available as needed    MONITORING, EVALUATION, GOAL: weight trends, intake   NEXT VISIT: as needed  Samuel Choi B. Samuel Choi, [place], [place] Registered Dietitian [phone] (mobile)

## 2021-02-22 NOTE — Progress Notes (Signed)
Hematology/Oncology Follow up note [facility] Telephone:([phone] [phone]) [phone]   Patient Care Team: Samuel Harrier, MD as PCP - General (Internal Medicine) Samuel Server, MD as Consulting Physician (Hematology and Oncology)  REASON FOR VISIT:  Follow up for treatment of squamous lung cancer.   HISTORY OF PRESENTING ILLNESS:  # Dec 2019 Stage I squamous lung cancer #s/p Bronchoscopy on 06/25/2018. subcarina EBUS FNA was non diagnostic, hypocellular specimen.  # 08/13/2018 CT guided right upper lobe biopsy pathology showed dense fibrosis and mixed inflammatory cells with prominent polytypic plasma cells component. Focal benign bronchial wall and alveolar spaces. No malignancy was identified.   # 08/13/2018 underwent right thoracotomy and wedge resection of a right upper lobe mass.  Frozen section was consistent with an inflammatory process.  On the second postop day, preliminary pathology reports high-grade malignancy.  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  08/20/2018 Patient was therefore taken back to the OR and underwent take complete lobectomy  pT1b pN0 cM0 stage I squamous cell lung cancer.  Margin is negative.  Recommend observation.    # 12/03/2018 CT chest w contrast showed local recurrence.  12/10/2018 PET showed No evidence of distant metastatic disease # 12/26/2018 s/p bronchoscopy biopsy. Confirmed local recurrence of squamous cell lung cancer.  medi port placed by Samuel Choi.  # 06/14/2020 CT chest w contrast was reviewed and discussed with patient.  Evidence of progressive lymphangitic spread of tumor throughout the right lung, with contralateral lung nodules with  right pleural effusion  MRI brain is negative for CNS involvement.  CT abdomen with contrast showed no evidence of abdominal metastatic disease PET scan was not approved by insurance-peer to peer appeal with Samuel Choi   # NGS - KRAS V14L,  TMB 26.6-high, MS stable, PD-L1  5% CDKN2C G48, DICER1c.2117-1G>T, FLT4 G1131S, NF1 A475f, NF1 L2643*, STK11 [id number] treatment Started concurrent chemoradiation on 01/16/2019 Carboplatin AUC of 2 and Taxol 45 mg/m2 weekly finished in 03/05/2019 04/10/2019, patient started on durvalumab maintenance. -CT showed progression.  Insurance denied PET scan evaluation. 07/16/2020-09/28/2020 4 cycles of carboplatin/paclitaxel/Keytruda  10/15/2020, CT chest abdomen pelvis redemonstrated postoperative and postradiation appearance of the right chest with perihilar consolidation and fibrosis.  Slight interval decrease in size of the pulmonary nodules associated with interlobular septal thickening nearly complete resolution of previously seen left-sided pulmonary nodules.  Minimal irregular residual.  Right pleural effusion improved.  Consistent with treatment response.  No evidence of metastatic disease with the abdomen or pelvis.  None obstructive bilateral nephrolithiasis - partial response to the treatment- 10/19/2020, Keytruda monotherapy maintenance  12/15/2020, CT chest without contrast showed resolution of lung nodules, no evidence of recurrent disease 12/30/2020, patient was admitted due to unprovoked extensive deep vein thrombosis from calf vein to the common femoral vein on the left.  Patient was started on heparin drip.  Patient underwent thrombolysis and is a colectomy on 12/31/2020.  Anticoagulation was switched to Eliquis at discharge.  INTERVAL HISTORY [name] a [age].o. male who has above history reviewed by me today presents for reatment of  recurrent  squamous cell lung cancer. Patient has been on Keytruda maintenance. Patient has no new complaints Occasional chronic cough, stable symptoms.  Denies any shortness of breath Denies any shortness of breath, leg swelling and active bleeding events. He is on Eliquis 5 mg twice daily.  Denies any lower extremity tenderness, pain .     Review of Systems  Constitutional:   Negative for appetite change, chills,  fatigue, fever and unexpected weight change.  HENT:   Negative for hearing loss and voice change.   Eyes:  Negative for eye problems and icterus.  Respiratory:  Positive for cough. Negative for chest tightness and shortness of breath.   Cardiovascular:  Negative for chest pain and leg swelling.  Gastrointestinal:  Negative for abdominal distention, abdominal pain and nausea.  Endocrine: Negative for hot flashes.  Genitourinary:  Negative for difficulty urinating, dysuria and frequency.   Musculoskeletal:  Negative for arthralgias.  Skin:  Negative for itching and rash.  Neurological:  Negative for light-headedness and numbness.  Hematological:  Negative for adenopathy. Does not bruise/bleed easily.  Psychiatric/Behavioral:  Negative for confusion.    MEDICAL HISTORY:  Past Medical History:  Diagnosis Date   Alcohol abuse    usually drinks 2-3 drinks per day   Atherosclerosis 06/2018   Chronic sinusitis    Dehydration 02/07/2019   Emphysema of lung ([place]) 06/2018   patient unaware of this.   Hip fracture ([facility]) 05/2018   no surgery   History of kidney stones 05/2018   per xray, bilateral nephrolitiasis   Hypertension    Squamous cell carcinoma of lung, right ([place]) 06/2018    SURGICAL HISTORY: Past Surgical History:  Procedure Laterality Date   BRAIN SURGERY  10/2017   nasal/sinus endoscopy. mass benign   ELECTROMAGNETIC NAVIGATION BROCHOSCOPY Right 06/25/2018   Procedure: ELECTROMAGNETIC NAVIGATION BRONCHOSCOPY;  Surgeon: Samuel Lipps, MD;  Location: [facility];  Service: Cardiopulmonary;  Laterality: Right;   ENDOBRONCHIAL ULTRASOUND Right 06/25/2018   Procedure: ENDOBRONCHIAL ULTRASOUND;  Surgeon: Samuel Lipps, MD;  Location: [facility];  Service: Cardiopulmonary;  Laterality: Right;   ENDOBRONCHIAL ULTRASOUND Right 12/26/2018   Procedure: ENDOBRONCHIAL ULTRASOUND RIGHT;  Surgeon: Samuel Lipps, MD;  Location: [facility];  Service:  Cardiopulmonary;  Laterality: Right;   FLEXIBLE BRONCHOSCOPY N/A 08/20/2018   Procedure: FLEXIBLE BRONCHOSCOPY PREOP;  Surgeon: Samuel Lewandowsky, MD;  Location: [facility];  Service: Thoracic;  Laterality: N/A;   IR CV LINE INJECTION  04/04/2019   NASAL SINUS SURGERY  10/2017   At [facility], frontal sinusotomy, ethmoidectomy, resection anterior cranial fossa neoplasm, turbinate resection   PERIPHERAL VASCULAR THROMBECTOMY Left 12/31/2020   Procedure: PERIPHERAL VASCULAR THROMBECTOMY;  Surgeon: Algernon Huxley, MD;  Location: [place] [facility];  Service: Cardiovascular;  Laterality: Left;   PORTACATH PLACEMENT Left 01/15/2019   Procedure: INSERTION PORT-A-CATH;  Surgeon: Samuel Lewandowsky, MD;  Location: [facility];  Service: General;  Laterality: Left;   THORACOTOMY Right 08/13/2018   Procedure: PREOP BROCHOSCOPY WITH RIGHT THORACOTOMY AND RUL RESECTION;  Surgeon: Samuel Lewandowsky, MD;  Location: [facility];  Service: General;  Laterality: Right;   THORACOTOMY Right 08/20/2018   Procedure: THORACOTOMY MAJOR RIGHT UPPER LOBE LOBECTOMY;  Surgeon: Samuel Lewandowsky, MD;  Location: [facility];  Service: Thoracic;  Laterality: Right;   TOE SURGERY Left    pin in left toe    SOCIAL HISTORY: Social History   Socioeconomic History   Marital status: Married    Spouse name: lisa   Number of children: Not on file   Years of education: Not on file   Highest education level: Not on file  Occupational History   Occupation: welding    Comment: taking time off to resolve issues  Tobacco Use   Smoking status: Former    Packs/day: 0.50    Years: 15.00    Pack years: 7.50    Types: Cigarettes    Quit date: 08/2018    Years since  quitting: 2.5   Smokeless tobacco: Never  Vaping Use   Vaping Use: Never used  Substance and Sexual Activity   Alcohol use: Yes    Alcohol/week: 3.0 standard drinks    Types: 3 Cans of beer per week    Comment: usually 2 drinks per week, per patient   Drug use: No   Sexual activity: Not on file   Other Topics Concern   Not on file  Social History Narrative   Not on file   Social Determinants of Health   Financial Resource Strain: Not on file  Food Insecurity: Not on file  Transportation Needs: Not on file  Physical Activity: Not on file  Stress: Not on file  Social Connections: Not on file  Intimate Partner Violence: Not on file    FAMILY HISTORY: Family History  Problem Relation Age of Onset   Breast cancer Mother    Diabetes Mother    Lung cancer Father    Hypertension Father     ALLERGIES:  has No Known Allergies.  MEDICATIONS:  Current Outpatient Medications  Medication Sig Dispense Refill   albuterol (VENTOLIN HFA) 108 (90 Base) MCG/ACT inhaler TAKE 2 PUFFS BY MOUTH EVERY 6 HOURS AS NEEDED FOR WHEEZE OR SHORTNESS OF BREATH 6.7 each 6   apixaban (ELIQUIS) 5 MG TABS tablet Two tabs po twice a day for seven days then one tab po twice a day afterwards (Patient taking differently: 5 mg 2 (two) times daily.) 74 tablet 0   budesonide-formoterol (SYMBICORT) 80-4.5 MCG/ACT inhaler Inhale 2 puffs into the lungs 2 (two) times daily. in the morning and at bedtime. 10.2 each 6   diltiazem (CARDIZEM CD) 120 MG 24 hr capsule Take 120 mg by mouth daily.     folic acid (V-R FOLIC ACID) 741 MCG tablet Take 1 tablet (400 mcg total) by mouth daily. 90 tablet 1   HYDROcodone-acetaminophen (NORCO/VICODIN) 5-325 MG tablet Take 1 tablet by mouth every 6 (six) hours as needed for severe pain. 10 tablet 0   loratadine (CLARITIN) 10 MG tablet Take 10 mg by mouth daily.     magnesium chloride (SLOW-MAG) 64 MG TBEC SR tablet Take 1 tablet (64 mg total) by mouth 2 (two) times daily. TAKE 1 TABLET (64 MG TOTAL) BY MOUTH DAILY. 60 tablet 1   methocarbamol (ROBAXIN) 500 MG tablet Take 1 tablet (500 mg total) by mouth at bedtime. 30 tablet 0   metoprolol succinate (TOPROL-XL) 25 MG 24 hr tablet Take 1 tablet (25 mg total) by mouth daily. 30 tablet 0   olmesartan (BENICAR) 40 MG tablet Take 40 mg  by mouth every other day.      ondansetron (ZOFRAN) 8 MG tablet Take 1 tablet (8 mg total) by mouth every 8 (eight) hours as needed for refractory nausea / vomiting. Start on day 3 after chemo. 90 tablet 3   prochlorperazine (COMPAZINE) 10 MG tablet Take 1 tablet (10 mg total) by mouth every 6 (six) hours as needed (Nausea or vomiting). 90 tablet 3   SPIRIVA HANDIHALER 18 MCG inhalation capsule INHALE 1 CAPSULE VIA HANDIHALER ONCE DAILY AT THE SAME TIME EVERY DAY 30 capsule 0   vitamin B-12 (CYANOCOBALAMIN) 1000 MCG tablet Take 1 tablet (1,000 mcg total) by mouth daily. 90 tablet 1   hydrALAZINE (APRESOLINE) 100 MG tablet Take 50 mg by mouth 2 (two) times daily.  (Patient not taking: No sig reported)     No current facility-administered medications for this visit.   Facility-Administered Medications  Ordered in Other Visits  Medication Dose Route Frequency Provider Last Rate Last Admin   albuterol (PROVENTIL) (2.5 MG/3ML) 0.083% nebulizer solution 2.5 mg  2.5 mg Nebulization Once System, Provider Not In       heparin lock flush 100 unit/mL  500 Units Intravenous Once Samuel Server, MD       sodium chloride flush (NS) 0.9 % injection 10 mL  10 mL Intravenous PRN Samuel Server, MD   10 mL at 04/04/19 0903   sodium chloride flush (NS) 0.9 % injection 10 mL  10 mL Intravenous PRN Samuel Server, MD   10 mL at 07/26/20 1308     PHYSICAL EXAMINATION: ECOG PERFORMANCE STATUS: 1 - Symptomatic but completely ambulatory Vitals:   02/22/21 0843  BP: (!) 149/92  Pulse: 95  Resp: 16  Temp: 98.2 F (36.8 C)  SpO2: 98%   Filed Weights   02/22/21 0843  Weight: 137 lb 8 oz (62.4 kg)    Physical Exam Constitutional:      General: He is not in acute distress. HENT:     Head: Normocephalic and atraumatic.  Eyes:     General: No scleral icterus. Cardiovascular:     Rate and Rhythm: Normal rate and regular rhythm.     Heart sounds: Normal heart sounds.  Pulmonary:     Effort: Pulmonary effort is normal. No  respiratory distress.     Breath sounds: No wheezing.  Abdominal:     General: Bowel sounds are normal. There is no distension.     Palpations: Abdomen is soft.  Musculoskeletal:        General: No deformity. Normal range of motion.     Cervical back: Normal range of motion and neck supple.  Skin:    General: Skin is warm and dry.     Findings: No erythema or rash.  Neurological:     Mental Status: He is alert and oriented to person, place, and time. Mental status is at baseline.     Cranial Nerves: No cranial nerve deficit.     Coordination: Coordination normal.  Psychiatric:        Mood and Affect: Mood normal.     LABORATORY DATA:  I have reviewed the data as listed Lab Results  Component Value Date   WBC 13.0 (H) 02/22/2021   HGB 14.1 02/22/2021   HCT 39.7 02/22/2021   MCV 109.7 (H) 02/22/2021   PLT 141 (L) 02/22/2021   Recent Labs    03/17/20 0830 03/31/20 0825 04/07/20 0854 04/22/20 0825 08/31/20 0812 09/07/20 1117 01/11/21 0816 02/01/21 0915 02/22/21 0819  NA 134* 137 137   < > 137   < > 135 141 140  K 4.6 3.9 3.7   < > 3.1*   < > 3.5 3.9 3.6  CL 101 102 104   < > 100   < > 99 105 101  CO2 20* 23 21*   < > 25   < > _0 GLUCOSE 167* 126* 163*   < > 107*   < > 108* 96 125*  BUN _1 < > 7   < > _2 CREATININE 1.19 1.10 1.10   < > 0.79   < > 1.21 1.04 1.01  CALCIUM 9.0 9.1 8.8*   < > 8.9   < > 9.3 9.6 9.5  GFRNONAA >60 >60 >60   < > >60   < > >60 >60 >60  GFRAA >  60 >60 >60  --   --   --   --   --   --   PROT 7.7 7.6 7.1   < > 7.4   < > 7.6 7.8 7.3  ALBUMIN 3.5 3.5 3.4*   < > 3.4*   < > 3.6 4.1 3.6  AST 32 29 25   < > 31   < > _0 ALT _1 < > 13   < > _2 ALKPHOS 86 87 76   < > 101   < > 81 74 84  BILITOT 0.8 0.7 0.5   < > 2.0*   < > 0.7 0.6 1.1  BILIDIR  --   --   --   --  0.4*  --   --   --   --    < > = values in this interval not displayed.    Iron/TIBC/Ferritin/ %Sat No results found for: IRON, TIBC, FERRITIN,  IRONPCTSAT   RADIOGRAPHIC STUDIES: I have personally reviewed the radiological images as listed and agreed with the findings in the report. CT Chest Wo Contrast  Result Date: 12/16/2020 CLINICAL DATA:  Lung cancer surveillance. EXAM: CT CHEST WITHOUT CONTRAST TECHNIQUE: Multidetector CT imaging of the chest was performed following the standard protocol without IV contrast. COMPARISON:  Multiple prior examinations. The most recent is 10/15/2020. FINDINGS: Cardiovascular: The heart is normal in size. No pericardial effusion. Stable tortuosity and calcification of the thoracic aorta and stable scattered coronary artery calcifications. Mediastinum/Nodes: No mediastinal or hilar mass or overt adenopathy. The esophagus is grossly. The left-sided Port-A-Cath is in good position, unchanged. Lungs/Pleura: Stable underlying emphysematous changes. Stable postoperative and post radiation changes involving the right hilum and right paramediastinal lung with dense radiation fibrosis associated bronchiectasis. I do not see any findings suspicious for recurrent tumor. No worrisome pulmonary nodules to suggest pulmonary metastatic disease. Small pulmonary nodules identified on more remote chest CT scans have resolved. No new pulmonary lesions. No pleural effusions or pleural lesions. Upper Abdomen: No significant upper abdominal findings. No obvious hepatic or adrenal gland lesions. Stable aortic calcifications and bilateral renal calculi. Musculoskeletal: No chest wall mass, supraclavicular or axillary adenopathy. The bony thorax is intact. IMPRESSION: 1. Stable postoperative and post radiation changes involving the right hilum and right paramediastinal lung. No findings suspicious for recurrent tumor. 2. No mediastinal or hilar mass or adenopathy. 3. Stable emphysematous changes. 4. Stable atherosclerotic calcifications involving the aorta and coronary arteries. 5. Emphysema and aortic atherosclerosis. 6. Small pulmonary  nodules identified on more remote chest CT scans have resolved. No new or worrisome pulmonary lesions. Aortic Atherosclerosis (ICD10-I70.0) and Emphysema (ICD10-J43.9). Electronically Signed   By: Marijo Sanes M.D.   On: 12/16/2020 11:44   PERIPHERAL VASCULAR CATHETERIZATION  Result Date: 12/31/2020 See surgical note for result.  US Venous Img Lower Unilateral Left  Result Date: 12/30/2020 CLINICAL DATA:  Left leg swelling EXAM: LEFT LOWER EXTREMITY VENOUS DOPPLER ULTRASOUND TECHNIQUE: Gray-scale sonography with graded compression, as well as color Doppler and duplex ultrasound were performed to evaluate the lower extremity deep venous systems from the level of the common femoral vein and including the common femoral, femoral, profunda femoral, popliteal and calf veins including the posterior tibial, peroneal and gastrocnemius veins when visible. The superficial great saphenous vein was also interrogated. Spectral Doppler was utilized to evaluate flow at rest and with distal augmentation maneuvers in the common femoral, femoral and popliteal veins. COMPARISON:  None. FINDINGS: Contralateral Common Femoral Vein: Respiratory phasicity is normal and symmetric with the symptomatic side. No evidence of thrombus. Normal compressibility. Common Femoral Vein: Thrombus is noted with decreased compressibility. The thrombus is occlusive in nature. Saphenofemoral Junction: No evidence of thrombus. Normal compressibility and flow on color Doppler imaging. Profunda Femoral Vein: Thrombus is noted with decreased compressibility. Femoral Vein: Thrombus is noted with decreased compressibility. Popliteal Vein: Thrombus is noted with decreased compressibility. Calf Veins: Thrombus is noted with decreased compressibility. Superficial Great Saphenous Vein: No evidence of thrombus. Normal compressibility. Venous Reflux:  None. Other Findings:  None. IMPRESSION: Extensive deep venous thrombosis is noted from the calf veins to the  common femoral vein on the left. Electronically Signed   By: Inez Catalina M.D.   On: 12/30/2020 21:20   DG Fluoro Guide CV Line Left  Result Date: 01/06/2021 CLINICAL DATA:  History of lung carcinoma with indwelling left subclavian vein Port-A-Cath region placed in 2020. Recurrent problems with ability to aspirate blood from the port. EXAM: CONTRAST INJECTION OF PORT A CATH UNDER FLUOROSCOPY CONTRAST:  20 mL Omnipaque 180 FLUOROSCOPY TIME:  48 seconds.  48.3 mGy PROCEDURE: Contrast was administered via the indwelling port after it was accessed. Fluoroscopic spot images were obtained of the catheter during injection. FINDINGS: The left subclavian port demonstrates stable positioning with the catheter tip located at the level of the mid SVC. With port injection, there is no evidence of contrast extravasation. A very prominent fibrin sheath is present surrounding the distal segment of the catheter at the level of the SVC and brachiocephalic vein. Contrast does not exit the end of the catheter and rather exits perforations in the sheath at the level of the SVC and brachiocephalic vein. Exiting contrast does flow freely in the SVC which shows no evidence of significant underlying stenosis. IMPRESSION: Very prominent occlusive fibrin sheath surrounding the distal segment of the indwelling port catheter in the SVC and brachiocephalic vein. Contrast does not exit the end hole but rather perforations in the fibrin sheath. Exiting contrast does flow freely in the SVC. Electronically Signed   By: Aletta Edouard M.D.   On: 01/06/2021 12:27   ECHOCARDIOGRAM COMPLETE  Result Date: 01/02/2021    ECHOCARDIOGRAM REPORT   Patient Name:   Samuel Choi Date of Exam: 01/01/2021 Medical Rec #:  [id number]      Height:       73.0 in Accession #:    [id number]     Weight:       124.6 lb Date of Birth:  1965-10-31       BSA:          1.759 m Patient Age:    7 years       BP:           144/86 mmHg Patient Gender: M              HR:            99 bpm. Exam Location:  [facility] Procedure: 2D Echo, Cardiac Doppler and Color Doppler Indications:     Ventricular Tachycardia I47.2  History:         Patient has no prior history of Echocardiogram examinations.                  Risk Factors:Hypertension.  Sonographer:     Alyse Low Roar Referring Phys:  [id number] Loletha Grayer Diagnosing Phys: Isaias Cowman MD IMPRESSIONS  1. Left ventricular ejection fraction, by estimation, is  60 to 65%. The left ventricle has normal function. The left ventricle has no regional wall motion abnormalities. Left ventricular diastolic parameters are consistent with Grade I diastolic dysfunction (impaired relaxation).  2. Right ventricular systolic function is normal. The right ventricular size is normal.  3. The mitral valve is normal in structure. Mild mitral valve regurgitation. No evidence of mitral stenosis.  4. Tricuspid valve regurgitation is moderate to severe.  5. The aortic valve is normal in structure. Aortic valve regurgitation is not visualized. No aortic stenosis is present.  6. The inferior vena cava is normal in size with greater than 50% respiratory variability, suggesting right atrial pressure of 3 mmHg. FINDINGS  Left Ventricle: Left ventricular ejection fraction, by estimation, is 60 to 65%. The left ventricle has normal function. The left ventricle has no regional wall motion abnormalities. The left ventricular internal cavity size was normal in size. There is  no left ventricular hypertrophy. Left ventricular diastolic parameters are consistent with Grade I diastolic dysfunction (impaired relaxation). Right Ventricle: The right ventricular size is normal. No increase in right ventricular wall thickness. Right ventricular systolic function is normal. Left Atrium: Left atrial size was normal in size. Right Atrium: Right atrial size was normal in size. Pericardium: There is no evidence of pericardial effusion. Mitral Valve: The mitral valve is normal in  structure. Mild mitral valve regurgitation. No evidence of mitral valve stenosis. Tricuspid Valve: The tricuspid valve is normal in structure. Tricuspid valve regurgitation is moderate to severe. No evidence of tricuspid stenosis. Aortic Valve: The aortic valve is normal in structure. Aortic valve regurgitation is not visualized. No aortic stenosis is present. Aortic valve peak gradient measures 7.5 mmHg. Pulmonic Valve: The pulmonic valve was normal in structure. Pulmonic valve regurgitation is not visualized. No evidence of pulmonic stenosis. Aorta: The aortic root is normal in size and structure. Venous: The inferior vena cava is normal in size with greater than 50% respiratory variability, suggesting right atrial pressure of 3 mmHg. IAS/Shunts: No atrial level shunt detected by color flow Doppler.  LEFT VENTRICLE PLAX 2D LVIDd:         4.20 cm  Diastology LVIDs:         2.90 cm  LV e' medial:    4.90 cm/s LV PW:         1.10 cm  LV E/e' medial:  13.3 LV IVS:        1.10 cm  LV e' lateral:   6.09 cm/s LVOT diam:     1.90 cm  LV E/e' lateral: 10.7 LVOT Area:     2.84 cm  RIGHT VENTRICLE RV Mid diam:    4.10 cm RV S prime:     11.10 cm/s TAPSE (M-mode): 1.7 cm LEFT ATRIUM             Index       RIGHT ATRIUM           Index LA diam:        3.10 cm 1.76 cm/m  RA Area:     16.90 cm LA Vol (A2C):   43.7 ml 24.84 ml/m RA Volume:   42.00 ml  23.87 ml/m LA Vol (A4C):   42.4 ml 24.10 ml/m LA Biplane Vol: 44.1 ml 25.07 ml/m  AORTIC VALVE                PULMONIC VALVE AV Area (Vmax): 2.96 cm    PV Vmax:        0.85 m/s  AV Vmax:        137.00 cm/s PV Peak grad:   2.9 mmHg AV Peak Grad:   7.5 mmHg    RVOT Peak grad: 1 mmHg LVOT Vmax:      143.00 cm/s  AORTA Ao Root diam: 3.00 cm MITRAL VALVE               TRICUSPID VALVE MV Area (PHT): 3.42 cm    TR Peak grad:   78.9 mmHg MV Decel Time: 222 msec    TR Vmax:        444.00 cm/s MV E velocity: 65.10 cm/s MV A velocity: 77.60 cm/s  SHUNTS MV E/A ratio:  0.84         Systemic Diam: 1.90 cm MV A Prime:    10.6 cm/s Isaias Cowman MD Electronically signed by Isaias Cowman MD Signature Date/Time: 01/02/2021/10:02:39 AM    Final       ASSESSMENT & PLAN:  1. Squamous cell carcinoma of right lung ([place])   2. Encounter for antineoplastic immunotherapy   3. Hypomagnesemia   4. Hypokalemia   5. Acute deep vein thrombosis (DVT) of femoral vein of left lower extremity ([facility])    #Recurrent Squamous cell carcinoma of lung, stage IV S/p carboplatin Taxol and Keytruda, status post 4 cycles- partial response  Labs reviewed and discussed with patient To proceed with Keytruda treatment today. Plan to repeat follow-up CT scan in September 2022.   #Unprovoked left lower extremity DVT, status post thrombolysis/thrombectomy. Continue Eliquis 5 mg twice daily.    Repeat left lower extremity ultrasound for follow-up.  #Chronic hypomagnesia.  Continue slow magnesium twice daily.   #Chronic hypokalemia, improved.  Potassium 3.6.  Continue potassium chloride 20 mEq twice daily.   #Port-A-Cath in place, dye study showed fibrin sheath which probably improved after patient was started on anticoagulation.  Continue monitor. #COPD/emphysema, continue Symbicort as instructed and albuterol as needed for wheezing.  return of visit:  Follow-up in 3 weeks.   Samuel Server, MD, PhD Hematology Oncology [facility] 02/22/2021

## 2021-02-22 NOTE — Patient Instructions (Signed)
[facility]  Discharge Instructions: Thank you for choosing [facility] to provide your oncology and hematology care.  If you have a lab appointment with the [facility], please go directly to the [facility] and check in at the registration area.  Wear comfortable clothing and clothing appropriate for easy access to any Portacath or PICC line.   We strive to give you quality time with your provider. You may need to reschedule your appointment if you arrive late (15 or more minutes).  Arriving late affects you and other patients whose appointments are after yours.  Also, if you miss three or more appointments without notifying the office, you may be dismissed from the clinic at the provider's discretion.      For prescription refill requests, have your pharmacy contact our office and allow 72 hours for refills to be completed.    Today you received the following chemotherapy and/or immunotherapy agents: Keytruda      To help prevent nausea and vomiting after your treatment, we encourage you to take your nausea medication as directed.  BELOW ARE SYMPTOMS THAT SHOULD BE REPORTED IMMEDIATELY: *FEVER GREATER THAN 100.4 F (38 C) OR HIGHER *CHILLS OR SWEATING *NAUSEA AND VOMITING THAT IS NOT CONTROLLED WITH YOUR NAUSEA MEDICATION *UNUSUAL SHORTNESS OF BREATH *UNUSUAL BRUISING OR BLEEDING *URINARY PROBLEMS (pain or burning when urinating, or frequent urination) *BOWEL PROBLEMS (unusual diarrhea, constipation, pain near the anus) TENDERNESS IN MOUTH AND THROAT WITH OR WITHOUT PRESENCE OF ULCERS (sore throat, sores in mouth, or a toothache) UNUSUAL RASH, SWELLING OR PAIN  UNUSUAL VAGINAL DISCHARGE OR ITCHING   Items with * indicate a potential emergency and should be followed up as soon as possible or go to the Emergency Department if any problems should occur.  Please show the CHEMOTHERAPY ALERT CARD or IMMUNOTHERAPY ALERT CARD at check-in  to the Emergency Department and triage nurse.  Should you have questions after your visit or need to cancel or reschedule your appointment, please contact [facility]  [phone] and follow the prompts.  Office hours are 8:00 a.m. to 4:30 p.m. Monday - Friday. Please note that voicemails left after 4:00 p.m. may not be returned until the following business day.  We are closed weekends and major holidays. You have access to a nurse at all times for urgent questions. Please call the main number to the clinic [phone] and follow the prompts.  For any non-urgent questions, you may also contact your provider using MyChart. We now offer e-Visits for anyone 73 and older to request care online for non-urgent symptoms. For details visit [email or web address].   Also download the MyChart app! Go to the app store, search "MyChart", open the app, select [facility], and log in with your MyChart username and password.  Due to Covid, a mask is required upon entering the hospital/clinic. If you do not have a mask, one will be given to you upon arrival. For doctor visits, patients may have 1 support person aged 74 or older with them. For treatment visits, patients cannot have anyone with them due to current Covid guidelines and our immunocompromised population. Pembrolizumab injection What is this medication? PEMBROLIZUMAB (pem broe liz ue mab) is a monoclonal antibody. It is used totreat certain types of cancer. This medicine may be used for other purposes; ask your health care provider orpharmacist if you have questions. COMMON BRAND NAME(S): Keytruda What should I tell my care team before I take this  medication? They need to know if you have any of these conditions: autoimmune diseases like Crohn's disease, ulcerative colitis, or lupus have had or planning to have an allogeneic stem cell transplant (uses someone else's stem cells) history of organ transplant history  of chest radiation nervous system problems like myasthenia gravis or Guillain-Barre syndrome an unusual or allergic reaction to pembrolizumab, other medicines, foods, dyes, or preservatives pregnant or trying to get pregnant breast-feeding How should I use this medication? This medicine is for infusion into a vein. It is given by a health careprofessional in a hospital or clinic setting. A special MedGuide will be given to you before each treatment. Be sure to readthis information carefully each time. Talk to your pediatrician regarding the use of this medicine in children. While this drug may be prescribed for children as young as 6 months for selectedconditions, precautions do apply. Overdosage: If you think you have taken too much of this medicine contact apoison control center or emergency room at once. NOTE: This medicine is only for you. Do not share this medicine with others. What if I miss a dose? It is important not to miss your dose. Call your doctor or health careprofessional if you are unable to keep an appointment. What may interact with this medication? Interactions have not been studied. This list may not describe all possible interactions. Give your health care provider a list of all the medicines, herbs, non-prescription drugs, or dietary supplements you use. Also tell them if you smoke, drink alcohol, or use illegaldrugs. Some items may interact with your medicine. What should I watch for while using this medication? Your condition will be monitored carefully while you are receiving thismedicine. You may need blood work done while you are taking this medicine. Do not become pregnant while taking this medicine or for 4 months after stopping it. Women should inform their doctor if they wish to become pregnant or think they might be pregnant. There is a potential for serious side effects to an unborn child. Talk to your health care professional or pharmacist for more information. Do  not breast-feed an infant while taking this medicine orfor 4 months after the last dose. What side effects may I notice from receiving this medication? Side effects that you should report to your doctor or health care professionalas soon as possible: allergic reactions like skin rash, itching or hives, swelling of the face, lips, or tongue bloody or black, tarry breathing problems changes in vision chest pain chills confusion constipation cough diarrhea dizziness or feeling faint or lightheaded fast or irregular heartbeat fever flushing joint pain low blood counts - this medicine may decrease the number of white blood cells, red blood cells and platelets. You may be at increased risk for infections and bleeding. muscle pain muscle weakness pain, tingling, numbness in the hands or feet persistent headache redness, blistering, peeling or loosening of the skin, including inside the mouth signs and symptoms of high blood sugar such as dizziness; dry mouth; dry skin; fruity breath; nausea; stomach pain; increased hunger or thirst; increased urination signs and symptoms of kidney injury like trouble passing urine or change in the amount of urine signs and symptoms of liver injury like dark urine, light-colored stools, loss of appetite, nausea, right upper belly pain, yellowing of the eyes or skin sweating swollen lymph nodes weight loss Side effects that usually do not require medical attention (report to yourdoctor or health care professional if they continue or are bothersome): decreased appetite hair loss tiredness  This list may not describe all possible side effects. Call your doctor for medical advice about side effects. You may report side effects to FDA at1-800-FDA-1088. Where should I keep my medication? This drug is given in a hospital or clinic and will not be stored at home. NOTE: This sheet is a summary. It may not cover all possible information. If you have questions about  this medicine, talk to your doctor, pharmacist, orhealth care provider.  2022 Elsevier/Gold Standard (2019-05-28 21:44:53)

## 2021-03-02 ENCOUNTER — Encounter: Payer: Self-pay | Admitting: Radiation Oncology

## 2021-03-02 ENCOUNTER — Ambulatory Visit
Admission: RE | Admit: 2021-03-02 | Discharge: 2021-03-02 | Disposition: A | Discharge status: Home / Self Care | Payer: 59 | Source: Ambulatory Visit | Attending: Radiation Oncology | Admitting: Radiation Oncology

## 2021-03-02 DIAGNOSIS — C3411 Malignant neoplasm of upper lobe, right bronchus or lung: Secondary | ICD-10-CM | POA: Insufficient documentation

## 2021-03-02 DIAGNOSIS — Z923 Personal history of irradiation: Secondary | ICD-10-CM | POA: Diagnosis not present

## 2021-03-02 DIAGNOSIS — Z87891 Personal history of nicotine dependence: Secondary | ICD-10-CM | POA: Diagnosis not present

## 2021-03-02 NOTE — Progress Notes (Signed)
Radiation Oncology Follow up Note  Name: Samuel Choi   Date:   03/02/2021 MRN:  [id number] DOB: 04-Apr-1966    This 55 y.o. 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  He is seen today in routine follow-up is doing well specifically [place] cough hemoptysis or chest tightness he is currently on Keytruda.  Recent CT scan in June showed postoperative and postradiation changes involving the right hilum and right paramediastinal lung no findings suspicious for recurrent tumor.  No mediastinal or hilar adenopathy was noted.  Small pulmonary nodules identified a more remote chest CT scans had resolved.  COMPLICATIONS OF TREATMENT: none  FOLLOW UP COMPLIANCE: keeps appointments   PHYSICAL EXAM:  BP (!) (P) 156/94 (BP Location: Left Arm, Patient Position: Sitting)   Pulse (!) (P) 111   Temp (!) (P) 96.4 F (35.8 C) (Tympanic)   Resp (P) 16   Wt (P) 136 lb 11.2 oz (62 kg)   BMI (P) 18.04 kg/m  Well-developed well-nourished patient in NAD. HEENT reveals PERLA, EOMI, discs not visualized.  Oral cavity is clear. No oral mucosal lesions are identified. Neck is clear without evidence of cervical or supraclavicular adenopathy. Lungs are clear to A&P. Cardiac examination is essentially unremarkable with regular rate and rhythm without murmur rub or thrill. Abdomen is benign with no organomegaly or masses noted. Motor sensory and DTR levels are equal and symmetric in the upper and lower extremities. Cranial nerves II through XII are grossly intact. Proprioception is intact. No  peripheral adenopathy or edema is identified. No motor or sensory levels are noted. Crude visual fields are within normal range.  RADIOLOGY RESULTS: CT scans reviewed compatible with above-stated findings  PLAN: Present time patient is doing well.  No active evidence of disease by CT criteria he continues on Keytruda by medical oncology.  I have asked to see him back in 1 year for follow-up.  Patient is to call with any concerns at any time.  I would like to take this opportunity to thank you for allowing me to participate in the care of your patient.Noreene Filbert, MD

## 2021-03-06 NOTE — Progress Notes (Signed)
03/08/21 8:37 AM   Samuel Choi Jul 21, 1965 [id number]  Referring provider:  Tracie Harrier, MD [address] [facility] [place],  [place] [address] Chief Complaint  Patient presents with   Elevated PSA     HPI: Samuel Choi is a 55 y.o.male who returns today or further evaluation on elevated PSA.   He has a history of squamous cell carcinoma and underwent right upper lobectomy in 08/2020 with Dr.Yu.   10/15/2020  CT abdomen pelvis showed no evidence of metastatic disease within the abdomen or pelvis and non obstructive bilateral nephrolithiasis.   He had a recent admission to the hospital for DVT s/p thrombectomy. Currently on Eliquis.  Today he denies any modifying or aggravating factors.  He denies any gross hematuria, dysuria or suprapubic/flank pain. He also states that he has felt that he is emptying his bladder completely.   He has no family history of prostate cancer.   PSA trend:  03/20/19   3.14 02/17/21   6.03  PMH: Past Medical History:  Diagnosis Date   Alcohol abuse    usually drinks 2-3 drinks per day   Atherosclerosis 06/2018   Chronic sinusitis    Dehydration 02/07/2019   Emphysema of lung ([place]) 06/2018   patient unaware of this.   Hip fracture ([place]) 05/2018   no surgery   History of kidney stones 05/2018   per xray, bilateral nephrolitiasis   Hypertension    Squamous cell carcinoma of lung, right ([place]) 06/2018    Surgical History: Past Surgical History:  Procedure Laterality Date   BRAIN SURGERY  10/2017   nasal/sinus endoscopy. mass benign   ELECTROMAGNETIC NAVIGATION BROCHOSCOPY Right 06/25/2018   Procedure: ELECTROMAGNETIC NAVIGATION BRONCHOSCOPY;  Surgeon: [name], MD;  Location: [facility] ORS;  Service: Cardiopulmonary;  Laterality: Right;   ENDOBRONCHIAL ULTRASOUND Right 06/25/2018   Procedure: ENDOBRONCHIAL ULTRASOUND;  Surgeon: [name], MD;  Location: [facility] OR[facility];  Service: Cardiopulmonary;  Laterality: Right;    ENDOBRONCHIAL ULTRASOUND Right 12/26/2018   Procedure: ENDOBRONCHIAL ULTRASOUND RIGHT;  Surgeon: [name], MD;  Location: [facility] OR[facility];  Service: Cardiopulmonary;  Laterality: Right;   FLEXIBLE BRONCHOSCOPY N/A 08/20/2018   Procedure: FLEXIBLE BRONCHOSCOPY PREOP;  Surgeon: [name], MD;  Location: [facility] ORS;  Service: Thoracic;  Laterality: N/A;   IR CV LINE INJECTION  04/04/2019   NASAL SINUS SURGERY  10/2017   At [facility], frontal sinusotomy, ethmoidectomy, resection anterior cranial fossa neoplasm, turbinate resection   PERIPHERAL VASCULAR THROMBECTOMY Left 12/31/2020   Procedure: PERIPHERAL VASCULAR THROMBECTOMY;  Surgeon: Algernon Huxley, MD;  Location: [facility];  Service: Cardiovascular;  Laterality: Left;   PORTACATH PLACEMENT Left 01/15/2019   Procedure: INSERTION PORT-A-CATH;  Surgeon: [name], MD;  Location: [facility] OR[facility];  Service: General;  Laterality: Left;   THORACOTOMY Right 08/13/2018   Procedure: PREOP BROCHOSCOPY WITH RIGHT THORACOTOMY AND RUL RESECTION;  Surgeon: [name], MD;  Location: [facility] OR[facility];  Service: General;  Laterality: Right;   THORACOTOMY Right 08/20/2018   Procedure: THORACOTOMY MAJOR RIGHT UPPER LOBE LOBECTOMY;  Surgeon: [name], MD;  Location: [facility] OR[facility];  Service: Thoracic;  Laterality: Right;   TOE SURGERY Left    pin in left toe    Home Medications:  Allergies as of 03/08/2021   No Known Allergies      Medication List        Accurate as of March 08, 2021  8:37 AM. If you have any questions, ask your nurse or doctor.  albuterol 108 (90 Base) MCG/ACT inhaler Commonly known as: VENTOLIN HFA TAKE 2 PUFFS BY MOUTH EVERY 6 HOURS AS NEEDED FOR WHEEZE OR SHORTNESS OF BREATH   apixaban 5 MG Tabs tablet Commonly known as: ELIQUIS Two tabs po twice a day for seven days then one tab po twice a day afterwards What changed:  how much to take when to take this additional instructions   budesonide-formoterol 80-4.5 MCG/ACT  inhaler Commonly known as: Symbicort Inhale 2 puffs into the lungs 2 (two) times daily. in the morning and at bedtime.   diltiazem 120 MG 24 hr capsule Commonly known as: CARDIZEM CD Take 120 mg by mouth daily.   folic acid 976 MCG tablet Commonly known as: V-R FOLIC ACID Take 1 tablet (400 mcg total) by mouth daily.   hydrALAZINE 100 MG tablet Commonly known as: APRESOLINE Take 50 mg by mouth 2 (two) times daily.   HYDROcodone-acetaminophen 5-325 MG tablet Commonly known as: NORCO/VICODIN Take 1 tablet by mouth every 6 (six) hours as needed for severe pain.   loratadine 10 MG tablet Commonly known as: CLARITIN Take 10 mg by mouth daily.   methocarbamol 500 MG tablet Commonly known as: ROBAXIN Take 1 tablet (500 mg total) by mouth at bedtime.   metoprolol succinate 25 MG 24 hr tablet Commonly known as: TOPROL-XL Take 1 tablet (25 mg total) by mouth daily.   olmesartan 40 MG tablet Commonly known as: BENICAR Take 40 mg by mouth every other day.   ondansetron 8 MG tablet Commonly known as: Zofran Take 1 tablet (8 mg total) by mouth every 8 (eight) hours as needed for refractory nausea / vomiting. Start on day 3 after chemo.   prochlorperazine 10 MG tablet Commonly known as: COMPAZINE Take 1 tablet (10 mg total) by mouth every 6 (six) hours as needed (Nausea or vomiting).   Slow-Mag 64 MG Tbec SR tablet Generic drug: magnesium chloride Take 1 tablet (64 mg total) by mouth 2 (two) times daily. TAKE 1 TABLET (64 MG TOTAL) BY MOUTH DAILY.   Spiriva HandiHaler 18 MCG inhalation capsule Generic drug: tiotropium INHALE 1 CAPSULE VIA HANDIHALER ONCE DAILY AT THE SAME TIME EVERY DAY   vitamin B-12 1000 MCG tablet Commonly known as: CYANOCOBALAMIN Take 1 tablet (1,000 mcg total) by mouth daily.        Allergies: No Known Allergies  Family History: Family History  Problem Relation Age of Onset   Breast cancer Mother    Diabetes Mother    Lung cancer Father     Hypertension Father     Social History:  reports that he quit smoking about 2 years ago. His smoking use included cigarettes. He has a 7.50 pack-year smoking history. He has never used smokeless tobacco. He reports current alcohol use of about 3.0 standard drinks per week. He reports that he does not use drugs.   Physical Exam: BP (!) 151/83   Pulse 96   Ht _0  (1.854 m)   Wt 141 lb (64 kg)   BMI 18.60 kg/m   Constitutional:  Alert and oriented, No acute distress. HEENT: [place] AT, moist mucus membranes.  Trachea midline, no masses. Cardiovascular: No clubbing, cyanosis, or edema. Respiratory: Normal respiratory effort, no increased work of breathing. Rectal: Normal sphincter tone,  50+  CC prostate, smooth no nodules, slight asymmetry with the right lateral lobe more firm but without obvious induration or discrete nodules or descrete nodules Skin: No rashes, bruises or suspicious lesions. Neurologic: Grossly intact, no  focal deficits, moving all 4 extremities. Psychiatric: Normal mood and affect.  Laboratory Data: Lab Results  Component Value Date   CREATININE 1.01 02/22/2021    Lab Results  Component Value Date   HGBA1C 4.4 07/04/2014    Pertinent Imaging:   CT ABDOMEN PELVIS FINDINGS   Hepatobiliary: No solid liver abnormality is seen. No gallstones, gallbladder wall thickening, or biliary dilatation.   Pancreas: Unremarkable. No pancreatic ductal dilatation or surrounding inflammatory changes.   Spleen: Normal in size without significant abnormality.   Adrenals/Urinary Tract: Adrenal glands are unremarkable. Multiple punctuate bilateral nonobstructive renal calculi. No ureteral calculi or hydronephrosis. Bladder is unremarkable.   Stomach/Bowel: Stomach is within normal limits. Appendix appears normal. No evidence of bowel wall thickening, distention, or inflammatory changes. Descending and sigmoid diverticulosis.   Vascular/Lymphatic: Aortic atherosclerosis. No  enlarged abdominal or pelvic lymph nodes.   Reproductive: No mass or other abnormality.   Other: No abdominal wall hernia or abnormality. No abdominopelvic ascites.   Musculoskeletal: No acute or significant osseous findings.   IMPRESSION: 1. Redemonstrated postoperative and post appearance of the right chest status post right upper lobectomy with perihilar consolidation and fibrosis. 2. Slight interval decrease in size of pulmonary nodules associated with interlobular septal thickening in the right lung. 3. There has been nearly complete resolution of previously seen left-sided pulmonary nodules, with minimal irregular residua. 4. Trace right pleural effusion, improved compared to prior examination. 5. Findings are consistent with treatment response of pulmonary metastatic disease. 6. No evidence of metastatic disease within the abdomen or pelvis. 7. Nonobstructive bilateral nephrolithiasis. 8. Coronary artery disease.   Aortic Atherosclerosis (ICD10-I70.0).     Electronically Signed   By: Eddie Candle M.D.   On: 10/16/2020 21:01   Assessment & Plan:    Elevated PSA   We reviewed the implications of an elevated PSA and the uncertainty surrounding it. In general, a man's PSA increases with age and is produced by both normal and cancerous prostate tissue. Differential for elevated PSA is BPH, prostate cancer, infection, recent intercourse/ejaculation, prostate infarction, recent urethroscopic manipulation (foley placement/cystoscopy) and prostatitis. Management of an elevated PSA can include observation or prostate biopsy and wediscussed this in detail. We discussed that indications for prostate biopsy are defined  by age and race specific PSA cutoffs as well as a PSA velocity of 0.75/year.  - Will recheck PSA - If PSA is still high, ideally would recommend prostate biopsy however see below discussion  2. History of DVT  - complicating factor  - On Eliquis may have to opt  for prostate MRI for further restratification - will need a Lovenox bridge or consider prostate biopsy, would need coordination and clearance  Return for Will call with results.  I,Kailey Littlejohn,acting as a Education administrator for Hollice Espy, MD.,have documented all relevant documentation on the behalf of Hollice Espy, MD,as directed by  Hollice Espy, MD while in the presence of Hollice Espy, MD.  I have reviewed the above documentation for accuracy and completeness, and I agree with the above.   Hollice Espy, MD   [facility] [address] [place], [place] [address] [phone]

## 2021-03-08 ENCOUNTER — Other Ambulatory Visit: Payer: Self-pay

## 2021-03-08 ENCOUNTER — Ambulatory Visit (INDEPENDENT_AMBULATORY_CARE_PROVIDER_SITE_OTHER): Payer: 59 | Admitting: Urology

## 2021-03-08 ENCOUNTER — Encounter: Payer: Self-pay | Admitting: Urology

## 2021-03-08 VITALS — BP 151/83 | HR 96 | Ht 73.0 in | Wt 141.0 lb

## 2021-03-08 DIAGNOSIS — R972 Elevated prostate specific antigen [PSA]: Secondary | ICD-10-CM

## 2021-03-09 ENCOUNTER — Other Ambulatory Visit: Payer: Self-pay

## 2021-03-09 ENCOUNTER — Ambulatory Visit
Admission: RE | Admit: 2021-03-09 | Discharge: 2021-03-09 | Disposition: A | Discharge status: Home / Self Care | Payer: 59 | Source: Ambulatory Visit | Attending: Oncology | Admitting: Oncology

## 2021-03-09 DIAGNOSIS — I82412 Acute embolism and thrombosis of left femoral vein: Secondary | ICD-10-CM | POA: Insufficient documentation

## 2021-03-09 LAB — PSA: Prostate Specific Ag, Serum: 8.2 ng/mL — ABNORMAL HIGH (ref 0.0–4.0)

## 2021-03-11 ENCOUNTER — Other Ambulatory Visit: Payer: Self-pay

## 2021-03-11 DIAGNOSIS — C3491 Malignant neoplasm of unspecified part of right bronchus or lung: Secondary | ICD-10-CM

## 2021-03-15 ENCOUNTER — Other Ambulatory Visit: Payer: Self-pay

## 2021-03-15 ENCOUNTER — Inpatient Hospital Stay (HOSPITAL_BASED_OUTPATIENT_CLINIC_OR_DEPARTMENT_OTHER): Payer: 59 | Admitting: Oncology

## 2021-03-15 ENCOUNTER — Encounter: Payer: Self-pay | Admitting: Oncology

## 2021-03-15 ENCOUNTER — Inpatient Hospital Stay: Payer: 59

## 2021-03-15 ENCOUNTER — Inpatient Hospital Stay: Payer: 59 | Attending: Oncology

## 2021-03-15 VITALS — BP 132/97 | HR 87 | Temp 98.4°F | Resp 18 | Wt 135.4 lb

## 2021-03-15 DIAGNOSIS — Z7951 Long term (current) use of inhaled steroids: Secondary | ICD-10-CM | POA: Diagnosis not present

## 2021-03-15 DIAGNOSIS — Z86718 Personal history of other venous thrombosis and embolism: Secondary | ICD-10-CM | POA: Diagnosis not present

## 2021-03-15 DIAGNOSIS — C3491 Malignant neoplasm of unspecified part of right bronchus or lung: Secondary | ICD-10-CM

## 2021-03-15 DIAGNOSIS — Z833 Family history of diabetes mellitus: Secondary | ICD-10-CM | POA: Diagnosis not present

## 2021-03-15 DIAGNOSIS — C3411 Malignant neoplasm of upper lobe, right bronchus or lung: Secondary | ICD-10-CM | POA: Insufficient documentation

## 2021-03-15 DIAGNOSIS — I82412 Acute embolism and thrombosis of left femoral vein: Secondary | ICD-10-CM | POA: Diagnosis not present

## 2021-03-15 DIAGNOSIS — Z7901 Long term (current) use of anticoagulants: Secondary | ICD-10-CM | POA: Insufficient documentation

## 2021-03-15 DIAGNOSIS — Z5112 Encounter for antineoplastic immunotherapy: Secondary | ICD-10-CM

## 2021-03-15 DIAGNOSIS — I10 Essential (primary) hypertension: Secondary | ICD-10-CM | POA: Diagnosis not present

## 2021-03-15 DIAGNOSIS — Z803 Family history of malignant neoplasm of breast: Secondary | ICD-10-CM | POA: Diagnosis not present

## 2021-03-15 DIAGNOSIS — Z902 Acquired absence of lung [part of]: Secondary | ICD-10-CM | POA: Diagnosis not present

## 2021-03-15 DIAGNOSIS — Z79899 Other long term (current) drug therapy: Secondary | ICD-10-CM | POA: Diagnosis not present

## 2021-03-15 DIAGNOSIS — Z801 Family history of malignant neoplasm of trachea, bronchus and lung: Secondary | ICD-10-CM | POA: Insufficient documentation

## 2021-03-15 DIAGNOSIS — Z5111 Encounter for antineoplastic chemotherapy: Secondary | ICD-10-CM

## 2021-03-15 DIAGNOSIS — E876 Hypokalemia: Secondary | ICD-10-CM | POA: Diagnosis not present

## 2021-03-15 DIAGNOSIS — Z8249 Family history of ischemic heart disease and other diseases of the circulatory system: Secondary | ICD-10-CM | POA: Insufficient documentation

## 2021-03-15 DIAGNOSIS — Z87891 Personal history of nicotine dependence: Secondary | ICD-10-CM | POA: Insufficient documentation

## 2021-03-15 LAB — CBC WITH DIFFERENTIAL/PLATELET
Abs Immature Granulocytes: 0.06 10*3/uL (ref 0.00–0.07)
Basophils Absolute: 0.1 10*3/uL (ref 0.0–0.1)
Basophils Relative: 1 %
Eosinophils Absolute: 0.2 10*3/uL (ref 0.0–0.5)
Eosinophils Relative: 1 %
HCT: 41.7 % (ref 39.0–52.0)
Hemoglobin: 14.6 g/dL (ref 13.0–17.0)
Immature Granulocytes: 1 %
Lymphocytes Relative: 9 %
Lymphs Abs: 1.2 10*3/uL (ref 0.7–4.0)
MCH: 38.5 pg — ABNORMAL HIGH (ref 26.0–34.0)
MCHC: 35 g/dL (ref 30.0–36.0)
MCV: 110 fL — ABNORMAL HIGH (ref 80.0–100.0)
Monocytes Absolute: 1 10*3/uL (ref 0.1–1.0)
Monocytes Relative: 8 %
Neutro Abs: 10.5 10*3/uL — ABNORMAL HIGH (ref 1.7–7.7)
Neutrophils Relative %: 80 %
Platelets: 132 10*3/uL — ABNORMAL LOW (ref 150–400)
RBC: 3.79 MIL/uL — ABNORMAL LOW (ref 4.22–5.81)
RDW: 12.1 % (ref 11.5–15.5)
WBC: 12.9 10*3/uL — ABNORMAL HIGH (ref 4.0–10.5)
nRBC: 0 % (ref 0.0–0.2)

## 2021-03-15 LAB — COMPREHENSIVE METABOLIC PANEL
ALT: 14 U/L (ref 0–44)
AST: 24 U/L (ref 15–41)
Albumin: 3.7 g/dL (ref 3.5–5.0)
Alkaline Phosphatase: 77 U/L (ref 38–126)
Anion gap: 10 (ref 5–15)
BUN: 15 mg/dL (ref 6–20)
CO2: 24 mmol/L (ref 22–32)
Calcium: 9 mg/dL (ref 8.9–10.3)
Chloride: 104 mmol/L (ref 98–111)
Creatinine, Ser: 0.98 mg/dL (ref 0.61–1.24)
GFR, Estimated: >60 mL/min (ref >60)
Glucose, Bld: 109 mg/dL — ABNORMAL HIGH (ref 70–99)
Potassium: 4.1 mmol/L (ref 3.5–5.1)
Sodium: 138 mmol/L (ref 135–145)
Total Bilirubin: 0.6 mg/dL (ref 0.3–1.2)
Total Protein: 7.7 g/dL (ref 6.5–8.1)

## 2021-03-15 MED ORDER — SODIUM CHLORIDE 0.9 % IV SOLN
Freq: Once | INTRAVENOUS | Status: AC
  Filled 2021-03-15: qty 250

## 2021-03-15 MED ORDER — HEPARIN SOD (PORK) LOCK FLUSH 100 UNIT/ML IV SOLN
INTRAVENOUS | Status: AC
  Filled 2021-03-15: qty 5

## 2021-03-15 MED ORDER — SODIUM CHLORIDE 0.9 % IV SOLN
200.0000 mg | Freq: Once | INTRAVENOUS | Status: AC
  Administered 2021-03-15: 200 mg via INTRAVENOUS
  Filled 2021-03-15: qty 8

## 2021-03-15 MED ORDER — APIXABAN 2.5 MG PO TABS: 2.5000 mg | ORAL_TABLET | Freq: Two times a day (BID) | ORAL | 3 refills | Status: DC

## 2021-03-15 MED ORDER — HEPARIN SOD (PORK) LOCK FLUSH 100 UNIT/ML IV SOLN
500.0000 [IU] | Freq: Once | INTRAVENOUS | Status: DC | PRN
Start: 2021-03-15 — End: 2021-03-15
  Filled 2021-03-15: qty 5

## 2021-03-15 NOTE — Progress Notes (Signed)
Pt here for follow up. No new concerns voiced.   

## 2021-03-15 NOTE — Progress Notes (Signed)
Hematology/Oncology Follow up note [facility] [phone] [phone]   Patient Care Team: Tracie Harrier, MD as PCP - General (Internal Medicine) Earlie Server, MD as Consulting Physician (Hematology and Oncology)  REASON FOR VISIT:  Follow up for treatment of squamous lung cancer.   HISTORY OF PRESENTING ILLNESS:  # Dec 2019 Stage I squamous lung cancer #s/p Bronchoscopy on 06/25/2018. subcarina EBUS FNA was non diagnostic, hypocellular specimen.  # 08/13/2018 CT guided right upper lobe biopsy pathology showed dense fibrosis and mixed inflammatory cells with prominent polytypic plasma cells component. Focal benign bronchial wall and alveolar spaces. No malignancy was identified.   # 08/13/2018 underwent right thoracotomy and wedge resection of a right upper lobe mass.  Frozen section was consistent with an inflammatory process.  On the second postop day, preliminary pathology reports high-grade malignancy.  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  08/20/2018 Patient was therefore taken back to the OR and underwent take complete lobectomy  pT1b pN0 cM0 stage I squamous cell lung cancer.  Margin is negative.  Recommend observation.    # 12/03/2018 CT chest w contrast showed local recurrence.  12/10/2018 PET showed No evidence of distant metastatic disease # 12/26/2018 s/p bronchoscopy biopsy. Confirmed local recurrence of squamous cell lung cancer.  medi port placed by Dr.Oaks.  # 06/14/2020 CT chest w contrast was reviewed and discussed with patient.  Evidence of progressive lymphangitic spread of tumor throughout the right lung, with contralateral lung nodules with  right pleural effusion  MRI brain is negative for CNS involvement.  CT abdomen with contrast showed no evidence of abdominal metastatic disease PET scan was not approved by insurance-peer to peer appeal with Dr.Vipul Bhanderi   # NGS - KRAS V14L,  TMB 26.6-high, MS stable, PD-L1  5% CDKN2C G48, DICER1c.2117-1G>T, FLT4 G1131S, NF1 A42f, NF1 L2643*, STK11 N1880fCancer treatment Started concurrent chemoradiation on 01/16/2019 Carboplatin AUC of 2 and Taxol 45 mg/m2 weekly finished in 03/05/2019 04/10/2019, patient started on durvalumab maintenance. -CT showed progression.  Insurance denied PET scan evaluation. 07/16/2020-09/28/2020 4 cycles of carboplatin/paclitaxel/Keytruda  10/15/2020, CT chest abdomen pelvis redemonstrated postoperative and postradiation appearance of the right chest with perihilar consolidation and fibrosis.  Slight interval decrease in size of the pulmonary nodules associated with interlobular septal thickening nearly complete resolution of previously seen left-sided pulmonary nodules.  Minimal irregular residual.  Right pleural effusion improved.  Consistent with treatment response.  No evidence of metastatic disease with the abdomen or pelvis.  None obstructive bilateral nephrolithiasis - partial response to the treatment- 10/19/2020, Keytruda monotherapy maintenance  12/15/2020, CT chest without contrast showed resolution of lung nodules, no evidence of recurrent disease 12/30/2020, patient was admitted due to unprovoked extensive deep vein thrombosis from calf vein to the common femoral vein on the left.  Patient was started on heparin drip.  Patient underwent thrombolysis and is a colectomy on 12/31/2020.  Anticoagulation was switched to Eliquis at discharge.  INTERVAL HISTORY Samuel BUNTONs a 89.o. male who has above history reviewed by me today presents for reatment of  recurrent  squamous cell lung cancer. Patient has been on Keytruda maintenance. Patient has no new complaints Patient reports feeling well.  Denies any nausea vomiting diarrhea. Patient takes Eliquis 5 mg twice daily.  He reports having enough 5 mg tablets supply at home..     Review of Systems  Constitutional:  Negative for appetite change, chills, fatigue, fever and unexpected  weight change.  HENT:  Negative for hearing loss and voice change.   Eyes:  Negative for eye problems and icterus.  Respiratory:  Negative for chest tightness, cough and shortness of breath.   Cardiovascular:  Negative for chest pain and leg swelling.  Gastrointestinal:  Negative for abdominal distention, abdominal pain and nausea.  Endocrine: Negative for hot flashes.  Genitourinary:  Negative for difficulty urinating, dysuria and frequency.   Musculoskeletal:  Negative for arthralgias.  Skin:  Negative for itching and rash.  Neurological:  Negative for light-headedness and numbness.  Hematological:  Negative for adenopathy. Does not bruise/bleed easily.  Psychiatric/Behavioral:  Negative for confusion.    MEDICAL HISTORY:  Past Medical History:  Diagnosis Date   Alcohol abuse    usually drinks 2-3 drinks per day   Atherosclerosis 06/2018   Chronic sinusitis    Dehydration 02/07/2019   Emphysema of lung ([place]) 06/2018   patient unaware of this.   Hip fracture ([place]) 05/2018   no surgery   History of kidney stones 05/2018   per xray, bilateral nephrolitiasis   Hypertension    Squamous cell carcinoma of lung, right ([place]) 06/2018    SURGICAL HISTORY: Past Surgical History:  Procedure Laterality Date   BRAIN SURGERY  10/2017   nasal/sinus endoscopy. mass benign   ELECTROMAGNETIC NAVIGATION BROCHOSCOPY Right 06/25/2018   Procedure: ELECTROMAGNETIC NAVIGATION BRONCHOSCOPY;  Surgeon: [name], MD;  Location: [facility];  Service: Cardiopulmonary;  Laterality: Right;   ENDOBRONCHIAL ULTRASOUND Right 06/25/2018   Procedure: ENDOBRONCHIAL ULTRASOUND;  Surgeon: [name], MD;  Location: [facility];  Service: Cardiopulmonary;  Laterality: Right;   ENDOBRONCHIAL ULTRASOUND Right 12/26/2018   Procedure: ENDOBRONCHIAL ULTRASOUND RIGHT;  Surgeon: [name], MD;  Location: [facility];  Service: Cardiopulmonary;  Laterality: Right;   FLEXIBLE BRONCHOSCOPY N/A 08/20/2018   Procedure:  FLEXIBLE BRONCHOSCOPY PREOP;  Surgeon: [name], MD;  Location: [facility];  Service: Thoracic;  Laterality: N/A;   IR CV LINE INJECTION  04/04/2019   NASAL SINUS SURGERY  10/2017   At [facility], frontal sinusotomy, ethmoidectomy, resection anterior cranial fossa neoplasm, turbinate resection   PERIPHERAL VASCULAR THROMBECTOMY Left 12/31/2020   Procedure: PERIPHERAL VASCULAR THROMBECTOMY;  Surgeon: Algernon Huxley, MD;  Location: [facility];  Service: Cardiovascular;  Laterality: Left;   PORTACATH PLACEMENT Left 01/15/2019   Procedure: INSERTION PORT-A-CATH;  Surgeon: [name], MD;  Location: [facility];  Service: General;  Laterality: Left;   THORACOTOMY Right 08/13/2018   Procedure: PREOP BROCHOSCOPY WITH RIGHT THORACOTOMY AND RUL RESECTION;  Surgeon: [name], MD;  Location: [facility];  Service: General;  Laterality: Right;   THORACOTOMY Right 08/20/2018   Procedure: THORACOTOMY MAJOR RIGHT UPPER LOBE LOBECTOMY;  Surgeon: [name], MD;  Location: [facility];  Service: Thoracic;  Laterality: Right;   TOE SURGERY Left    pin in left toe    SOCIAL HISTORY: Social History   Socioeconomic History   Marital status: Married    Spouse name: lisa   Number of children: Not on file   Years of education: Not on file   Highest education level: Not on file  Occupational History   Occupation: welding    Comment: taking time off to resolve issues  Tobacco Use   Smoking status: Former    Packs/day: 0.50    Years: 15.00    Pack years: 7.50    Types: Cigarettes    Quit date: 08/2018    Years since quitting: 2.5   Smokeless tobacco: Never  Vaping Use  Vaping Use: Never used  Substance and Sexual Activity   Alcohol use: Yes    Alcohol/week: 3.0 standard drinks    Types: 3 Cans of beer per week    Comment: usually 2 drinks per week, per patient   Drug use: No   Sexual activity: Not on file  Other Topics Concern   Not on file  Social History Narrative   Not on file   Social  Determinants of Health   Financial Resource Strain: Not on file  Food Insecurity: Not on file  Transportation Needs: Not on file  Physical Activity: Not on file  Stress: Not on file  Social Connections: Not on file  Intimate Partner Violence: Not on file    FAMILY HISTORY: Family History  Problem Relation Age of Onset   Breast cancer Mother    Diabetes Mother    Lung cancer Father    Hypertension Father     ALLERGIES:  has No Known Allergies.  MEDICATIONS:  Current Outpatient Medications  Medication Sig Dispense Refill   albuterol (VENTOLIN HFA) 108 (90 Base) MCG/ACT inhaler TAKE 2 PUFFS BY MOUTH EVERY 6 HOURS AS NEEDED FOR WHEEZE OR SHORTNESS OF BREATH 6.7 each 6   [START ON 04/04/2021] apixaban (ELIQUIS) 2.5 MG TABS tablet Take 1 tablet (2.5 mg total) by mouth 2 (two) times daily. 60 tablet 3   apixaban (ELIQUIS) 5 MG TABS tablet Two tabs po twice a day for seven days then one tab po twice a day afterwards (Patient taking differently: 5 mg 2 (two) times daily.) 74 tablet 0   budesonide-formoterol (SYMBICORT) 80-4.5 MCG/ACT inhaler Inhale 2 puffs into the lungs 2 (two) times daily. in the morning and at bedtime. 10.2 each 6   diltiazem (CARDIZEM CD) 120 MG 24 hr capsule Take 120 mg by mouth daily.     folic acid (V-R FOLIC ACID) 573 MCG tablet Take 1 tablet (400 mcg total) by mouth daily. 90 tablet 1   hydrALAZINE (APRESOLINE) 100 MG tablet Take 50 mg by mouth 2 (two) times daily.     HYDROcodone-acetaminophen (NORCO/VICODIN) 5-325 MG tablet Take 1 tablet by mouth every 6 (six) hours as needed for severe pain. 10 tablet 0   loratadine (CLARITIN) 10 MG tablet Take 10 mg by mouth daily.     magnesium chloride (SLOW-MAG) 64 MG TBEC SR tablet Take 1 tablet (64 mg total) by mouth 2 (two) times daily. TAKE 1 TABLET (64 MG TOTAL) BY MOUTH DAILY. 60 tablet 1   methocarbamol (ROBAXIN) 500 MG tablet Take 1 tablet (500 mg total) by mouth at bedtime. 30 tablet 0   metoprolol succinate  (TOPROL-XL) 25 MG 24 hr tablet Take 1 tablet (25 mg total) by mouth daily. 30 tablet 0   olmesartan (BENICAR) 40 MG tablet Take 40 mg by mouth every other day.      SPIRIVA HANDIHALER 18 MCG inhalation capsule INHALE 1 CAPSULE VIA HANDIHALER ONCE DAILY AT THE SAME TIME EVERY DAY 30 capsule 0   vitamin B-12 (CYANOCOBALAMIN) 1000 MCG tablet Take 1 tablet (1,000 mcg total) by mouth daily. 90 tablet 1   ondansetron (ZOFRAN) 8 MG tablet Take 1 tablet (8 mg total) by mouth every 8 (eight) hours as needed for refractory nausea / vomiting. Start on day 3 after chemo. (Patient not taking: Reported on 03/15/2021) 90 tablet 3   prochlorperazine (COMPAZINE) 10 MG tablet Take 1 tablet (10 mg total) by mouth every 6 (six) hours as needed (Nausea or vomiting). (Patient not taking:  Reported on 03/15/2021) 90 tablet 3   No current facility-administered medications for this visit.   Facility-Administered Medications Ordered in Other Visits  Medication Dose Route Frequency Provider Last Rate Last Admin   albuterol (PROVENTIL) (2.5 MG/3ML) 0.083% nebulizer solution 2.5 mg  2.5 mg Nebulization Once System, Provider Not In       heparin lock flush 100 unit/mL  500 Units Intravenous Once Earlie Server, MD       heparin lock flush 100 unit/mL  500 Units Intracatheter Once PRN Earlie Server, MD       sodium chloride flush (NS) 0.9 % injection 10 mL  10 mL Intravenous PRN Earlie Server, MD   10 mL at 04/04/19 0903   sodium chloride flush (NS) 0.9 % injection 10 mL  10 mL Intravenous PRN Earlie Server, MD   10 mL at 07/26/20 1308     PHYSICAL EXAMINATION: ECOG PERFORMANCE STATUS: 1 - Symptomatic but completely ambulatory Vitals:   03/15/21 0905  BP: (!) 132/97  Pulse: 87  Resp: 18  Temp: 98.4 F (36.9 C)  SpO2: 97%   Filed Weights   03/15/21 0905  Weight: 135 lb 6.4 oz (61.4 kg)    Physical Exam Constitutional:      General: He is not in acute distress. HENT:     Head: Normocephalic and atraumatic.  Eyes:     General: No  scleral icterus. Cardiovascular:     Rate and Rhythm: Normal rate and regular rhythm.     Heart sounds: Normal heart sounds.  Pulmonary:     Effort: Pulmonary effort is normal. No respiratory distress.     Breath sounds: No wheezing.  Abdominal:     General: Bowel sounds are normal. There is no distension.     Palpations: Abdomen is soft.  Musculoskeletal:        General: No deformity. Normal range of motion.     Cervical back: Normal range of motion and neck supple.  Skin:    General: Skin is warm and dry.     Findings: No erythema or rash.  Neurological:     Mental Status: He is alert and oriented to person, place, and time. Mental status is at baseline.     Cranial Nerves: No cranial nerve deficit.     Coordination: Coordination normal.  Psychiatric:        Mood and Affect: Mood normal.     LABORATORY DATA:  I have reviewed the data as listed Lab Results  Component Value Date   WBC 12.9 (H) 03/15/2021   HGB 14.6 03/15/2021   HCT 41.7 03/15/2021   MCV 110.0 (H) 03/15/2021   PLT 132 (L) 03/15/2021   Recent Labs    03/17/20 0830 03/31/20 0825 04/07/20 0854 04/22/20 0825 08/31/20 0812 09/07/20 1117 02/01/21 0915 02/22/21 0819 03/15/21 0854  NA 134* 137 137   < > 137   < > 141 140 138  K 4.6 3.9 3.7   < > 3.1*   < > 3.9 3.6 4.1  CL 101 102 104   < > 100   < > 105 101 104  CO2 20* 23 21*   < > 25   < > _0 GLUCOSE 167* 126* 163*   < > 107*   < > 96 125* 109*  BUN _1 < > 7   < > _2 CREATININE 1.19 1.10 1.10   < > 0.79   < >  1.04 1.01 0.98  CALCIUM 9.0 9.1 8.8*   < > 8.9   < > 9.6 9.5 9.0  GFRNONAA >60 >60 >60   < > >60   < > >60 >60 >60  GFRAA >60 >60 >60  --   --   --   --   --   --   PROT 7.7 7.6 7.1   < > 7.4   < > 7.8 7.3 7.7  ALBUMIN 3.5 3.5 3.4*   < > 3.4*   < > 4.1 3.6 3.7  AST 32 29 25   < > 31   < > _0 ALT _1 < > 13   < > _2 ALKPHOS 86 87 76   < > 101   < > 74 84 77  BILITOT 0.8 0.7 0.5   < > 2.0*   < > 0.6  1.1 0.6  BILIDIR  --   --   --   --  0.4*  --   --   --   --    < > = values in this interval not displayed.    Iron/TIBC/Ferritin/ %Sat No results found for: IRON, TIBC, FERRITIN, IRONPCTSAT   RADIOGRAPHIC STUDIES: I have personally reviewed the radiological images as listed and agreed with the findings in the report. CT Chest Wo Contrast  Result Date: 12/16/2020 CLINICAL DATA:  Lung cancer surveillance. EXAM: CT CHEST WITHOUT CONTRAST TECHNIQUE: Multidetector CT imaging of the chest was performed following the standard protocol without IV contrast. COMPARISON:  Multiple prior examinations. The most recent is 10/15/2020. FINDINGS: Cardiovascular: The heart is normal in size. No pericardial effusion. Stable tortuosity and calcification of the thoracic aorta and stable scattered coronary artery calcifications. Mediastinum/Nodes: No mediastinal or hilar mass or overt adenopathy. The esophagus is grossly. The left-sided Port-A-Cath is in good position, unchanged. Lungs/Pleura: Stable underlying emphysematous changes. Stable postoperative and post radiation changes involving the right hilum and right paramediastinal lung with dense radiation fibrosis associated bronchiectasis. I do not see any findings suspicious for recurrent tumor. No worrisome pulmonary nodules to suggest pulmonary metastatic disease. Small pulmonary nodules identified on more remote chest CT scans have resolved. No new pulmonary lesions. No pleural effusions or pleural lesions. Upper Abdomen: No significant upper abdominal findings. No obvious hepatic or adrenal gland lesions. Stable aortic calcifications and bilateral renal calculi. Musculoskeletal: No chest wall mass, supraclavicular or axillary adenopathy. The bony thorax is intact. IMPRESSION: 1. Stable postoperative and post radiation changes involving the right hilum and right paramediastinal lung. No findings suspicious for recurrent tumor. 2. No mediastinal or hilar mass or  adenopathy. 3. Stable emphysematous changes. 4. Stable atherosclerotic calcifications involving the aorta and coronary arteries. 5. Emphysema and aortic atherosclerosis. 6. Small pulmonary nodules identified on more remote chest CT scans have resolved. No new or worrisome pulmonary lesions. Aortic Atherosclerosis (ICD10-I70.0) and Emphysema (ICD10-J43.9). Electronically Signed   By: Marijo Sanes M.D.   On: 12/16/2020 11:44   PERIPHERAL VASCULAR CATHETERIZATION  Result Date: 12/31/2020 See surgical note for result.  US Venous Img Lower Unilateral Left  Result Date: 03/09/2021 CLINICAL DATA:  history of DVT, follow up EXAM: LEFT LOWER EXTREMITY VENOUS DOPPLER ULTRASOUND TECHNIQUE: Gray-scale sonography with graded compression, as well as color Doppler and duplex ultrasound were performed to evaluate the lower extremity deep venous systems from the level of the common femoral vein and including the common femoral, femoral, profunda femoral, popliteal and calf veins  including the posterior tibial, peroneal and gastrocnemius veins when visible. The superficial great saphenous vein was also interrogated. Spectral Doppler was utilized to evaluate flow at rest and with distal augmentation maneuvers in the common femoral, femoral and popliteal veins. COMPARISON:  June 23 FINDINGS: Contralateral Common Femoral Vein: Respiratory phasicity is normal and symmetric with the symptomatic side. No evidence of thrombus. Normal compressibility. Common Femoral Vein: No evidence of thrombus. Normal compressibility, respiratory phasicity and response to augmentation. Saphenofemoral Junction: No evidence of thrombus. Normal compressibility and flow on color Doppler imaging. Profunda Femoral Vein: No evidence of thrombus. Normal compressibility and flow on color Doppler imaging. Femoral Vein: No evidence of thrombus. Normal compressibility, respiratory phasicity and response to augmentation. Popliteal Vein: Wall adherent  nonocclusive DVT. Calf Veins: No evidence of thrombus. Normal compressibility and flow on color Doppler imaging. Other Findings:  None. IMPRESSION: Markedly improved patency of the left lower extremity deep venous system with small residual wall adherent nonocclusive thrombus limited to the popliteal vein. Electronically Signed   By: Albin Felling M.D.   On: 03/09/2021 13:45   US Venous Img Lower Unilateral Left  Result Date: 12/30/2020 CLINICAL DATA:  Left leg swelling EXAM: LEFT LOWER EXTREMITY VENOUS DOPPLER ULTRASOUND TECHNIQUE: Gray-scale sonography with graded compression, as well as color Doppler and duplex ultrasound were performed to evaluate the lower extremity deep venous systems from the level of the common femoral vein and including the common femoral, femoral, profunda femoral, popliteal and calf veins including the posterior tibial, peroneal and gastrocnemius veins when visible. The superficial great saphenous vein was also interrogated. Spectral Doppler was utilized to evaluate flow at rest and with distal augmentation maneuvers in the common femoral, femoral and popliteal veins. COMPARISON:  None. FINDINGS: Contralateral Common Femoral Vein: Respiratory phasicity is normal and symmetric with the symptomatic side. No evidence of thrombus. Normal compressibility. Common Femoral Vein: Thrombus is noted with decreased compressibility. The thrombus is occlusive in nature. Saphenofemoral Junction: No evidence of thrombus. Normal compressibility and flow on color Doppler imaging. Profunda Femoral Vein: Thrombus is noted with decreased compressibility. Femoral Vein: Thrombus is noted with decreased compressibility. Popliteal Vein: Thrombus is noted with decreased compressibility. Calf Veins: Thrombus is noted with decreased compressibility. Superficial Great Saphenous Vein: No evidence of thrombus. Normal compressibility. Venous Reflux:  None. Other Findings:  None. IMPRESSION: Extensive deep venous  thrombosis is noted from the calf veins to the common femoral vein on the left. Electronically Signed   By: Inez Catalina M.D.   On: 12/30/2020 21:20   DG Fluoro Guide CV Line Left  Result Date: 01/06/2021 CLINICAL DATA:  History of lung carcinoma with indwelling left subclavian vein Port-A-Cath region placed in 2020. Recurrent problems with ability to aspirate blood from the port. EXAM: CONTRAST INJECTION OF PORT A CATH UNDER FLUOROSCOPY CONTRAST:  20 mL Omnipaque 180 FLUOROSCOPY TIME:  48 seconds.  48.3 mGy PROCEDURE: Contrast was administered via the indwelling port after it was accessed. Fluoroscopic spot images were obtained of the catheter during injection. FINDINGS: The left subclavian port demonstrates stable positioning with the catheter tip located at the level of the mid SVC. With port injection, there is no evidence of contrast extravasation. A very prominent fibrin sheath is present surrounding the distal segment of the catheter at the level of the SVC and brachiocephalic vein. Contrast does not exit the end of the catheter and rather exits perforations in the sheath at the level of the SVC and brachiocephalic vein. Exiting contrast does flow freely  in the SVC which shows no evidence of significant underlying stenosis. IMPRESSION: Very prominent occlusive fibrin sheath surrounding the distal segment of the indwelling port catheter in the SVC and brachiocephalic vein. Contrast does not exit the end hole but rather perforations in the fibrin sheath. Exiting contrast does flow freely in the SVC. Electronically Signed   By: Aletta Edouard M.D.   On: 01/06/2021 12:27   ECHOCARDIOGRAM COMPLETE  Result Date: 01/02/2021    ECHOCARDIOGRAM REPORT   Patient Name:   ROSALIO CATTERTON Date of Exam: 01/01/2021 Medical Rec #:  [id number]      Height:       73.0 in Accession #:    [id number]     Weight:       124.6 lb Date of Birth:  1965/11/22       BSA:          1.759 m Patient Age:    75 years       BP:            144/86 mmHg Patient Gender: M              HR:           99 bpm. Exam Location:  [facility] Procedure: 2D Echo, Cardiac Doppler and Color Doppler Indications:     Ventricular Tachycardia I47.2  History:         Patient has no prior history of Echocardiogram examinations.                  Risk Factors:Hypertension.  Sonographer:     Alyse Low Roar Referring Phys:  [id number] Loletha Grayer Diagnosing Phys: Isaias Cowman MD IMPRESSIONS  1. Left ventricular ejection fraction, by estimation, is 60 to 65%. The left ventricle has normal function. The left ventricle has no regional wall motion abnormalities. Left ventricular diastolic parameters are consistent with Grade I diastolic dysfunction (impaired relaxation).  2. Right ventricular systolic function is normal. The right ventricular size is normal.  3. The mitral valve is normal in structure. Mild mitral valve regurgitation. No evidence of mitral stenosis.  4. Tricuspid valve regurgitation is moderate to severe.  5. The aortic valve is normal in structure. Aortic valve regurgitation is not visualized. No aortic stenosis is present.  6. The inferior vena cava is normal in size with greater than 50% respiratory variability, suggesting right atrial pressure of 3 mmHg. FINDINGS  Left Ventricle: Left ventricular ejection fraction, by estimation, is 60 to 65%. The left ventricle has normal function. The left ventricle has no regional wall motion abnormalities. The left ventricular internal cavity size was normal in size. There is  no left ventricular hypertrophy. Left ventricular diastolic parameters are consistent with Grade I diastolic dysfunction (impaired relaxation). Right Ventricle: The right ventricular size is normal. No increase in right ventricular wall thickness. Right ventricular systolic function is normal. Left Atrium: Left atrial size was normal in size. Right Atrium: Right atrial size was normal in size. Pericardium: There is no evidence of pericardial effusion.  Mitral Valve: The mitral valve is normal in structure. Mild mitral valve regurgitation. No evidence of mitral valve stenosis. Tricuspid Valve: The tricuspid valve is normal in structure. Tricuspid valve regurgitation is moderate to severe. No evidence of tricuspid stenosis. Aortic Valve: The aortic valve is normal in structure. Aortic valve regurgitation is not visualized. No aortic stenosis is present. Aortic valve peak gradient measures 7.5 mmHg. Pulmonic Valve: The pulmonic valve was normal in structure. Pulmonic valve regurgitation is not  visualized. No evidence of pulmonic stenosis. Aorta: The aortic root is normal in size and structure. Venous: The inferior vena cava is normal in size with greater than 50% respiratory variability, suggesting right atrial pressure of 3 mmHg. IAS/Shunts: No atrial level shunt detected by color flow Doppler.  LEFT VENTRICLE PLAX 2D LVIDd:         4.20 cm  Diastology LVIDs:         2.90 cm  LV e' medial:    4.90 cm/s LV PW:         1.10 cm  LV E/e' medial:  13.3 LV IVS:        1.10 cm  LV e' lateral:   6.09 cm/s LVOT diam:     1.90 cm  LV E/e' lateral: 10.7 LVOT Area:     2.84 cm  RIGHT VENTRICLE RV Mid diam:    4.10 cm RV S prime:     11.10 cm/s TAPSE (M-mode): 1.7 cm LEFT ATRIUM             Index       RIGHT ATRIUM           Index LA diam:        3.10 cm 1.76 cm/m  RA Area:     16.90 cm LA Vol (A2C):   43.7 ml 24.84 ml/m RA Volume:   42.00 ml  23.87 ml/m LA Vol (A4C):   42.4 ml 24.10 ml/m LA Biplane Vol: 44.1 ml 25.07 ml/m  AORTIC VALVE                PULMONIC VALVE AV Area (Vmax): 2.96 cm    PV Vmax:        0.85 m/s AV Vmax:        137.00 cm/s PV Peak grad:   2.9 mmHg AV Peak Grad:   7.5 mmHg    RVOT Peak grad: 1 mmHg LVOT Vmax:      143.00 cm/s  AORTA Ao Root diam: 3.00 cm MITRAL VALVE               TRICUSPID VALVE MV Area (PHT): 3.42 cm    TR Peak grad:   78.9 mmHg MV Decel Time: 222 msec    TR Vmax:        444.00 cm/s MV E velocity: 65.10 cm/s MV A velocity: 77.60  cm/s  SHUNTS MV E/A ratio:  0.84        Systemic Diam: 1.90 cm MV A Prime:    10.6 cm/s Isaias Cowman MD Electronically signed by Isaias Cowman MD Signature Date/Time: 01/02/2021/10:02:39 AM    Final       ASSESSMENT & PLAN:  1. Squamous cell carcinoma of right lung ([place])   2. Encounter for antineoplastic immunotherapy   3. Acute deep vein thrombosis (DVT) of femoral vein of left lower extremity ([facility])   4. Encounter for antineoplastic chemotherapy    #Recurrent Squamous cell carcinoma of lung, stage IV S/p carboplatin Taxol and Keytruda, status post 4 cycles- partial response  Labs are reviewed and discussed with patient Plan to repeat CT scan, will order after next treatment.   #Unprovoked left lower extremity DVT, status post thrombolysis/thrombectomy. Continue Eliquis 5 mg twice daily.    Repeat left lower extremity showed improvement of thrombosis.  Residual small nonocclusive thrombus limited to popliteal vein. Advised patient to continue Eliquis 5 mg twice daily until end of September to complete total 3 months of therapeutic anticoagulation.  After that  he can switch to 2.5 mg twice daily.  Prescription sent to pharmacy.  He voices understanding and agrees with the plan.  #Chronic hypomagnesia.  Continue slow magnesium twice daily.   #Chronic hypokalemia, improved.  Potassium 4.1.  Continue potassium chloride 20 mEq twice daily.   #Port-A-Cath in place, dye study showed fibrin sheath which probably improved after patient was started on anticoagulation.  Continue monitor. #COPD/emphysema, continue Symbicort as instructed and albuterol as needed for wheezing.  return of visit:  Follow-up in 3 weeks.   Earlie Server, MD, PhD Hematology Oncology [facility] 03/15/2021

## 2021-03-15 NOTE — Patient Instructions (Signed)
[facility]  Discharge Instructions: Thank you for choosing [facility] to provide your oncology and hematology care.  If you have a lab appointment with the [facility], please go directly to the [facility] and check in at the registration area.  Wear comfortable clothing and clothing appropriate for easy access to any Portacath or PICC line.   We strive to give you quality time with your provider. You may need to reschedule your appointment if you arrive late (15 or more minutes).  Arriving late affects you and other patients whose appointments are after yours.  Also, if you miss three or more appointments without notifying the office, you may be dismissed from the clinic at the provider's discretion.      For prescription refill requests, have your pharmacy contact our office and allow 72 hours for refills to be completed.    Today you received the following :Keytruda   To help prevent nausea and vomiting after your treatment, we encourage you to take your nausea medication as directed.  BELOW ARE SYMPTOMS THAT SHOULD BE REPORTED IMMEDIATELY: *FEVER GREATER THAN 100.4 F (38 C) OR HIGHER *CHILLS OR SWEATING *NAUSEA AND VOMITING THAT IS NOT CONTROLLED WITH YOUR NAUSEA MEDICATION *UNUSUAL SHORTNESS OF BREATH *UNUSUAL BRUISING OR BLEEDING *URINARY PROBLEMS (pain or burning when urinating, or frequent urination) *BOWEL PROBLEMS (unusual diarrhea, constipation, pain near the anus) TENDERNESS IN MOUTH AND THROAT WITH OR WITHOUT PRESENCE OF ULCERS (sore throat, sores in mouth, or a toothache) UNUSUAL RASH, SWELLING OR PAIN  UNUSUAL VAGINAL DISCHARGE OR ITCHING   Items with * indicate a potential emergency and should be followed up as soon as possible or go to the Emergency Department if any problems should occur.  Please show the CHEMOTHERAPY ALERT CARD or IMMUNOTHERAPY ALERT CARD at check-in to the Emergency Department and triage  nurse.  Should you have questions after your visit or need to cancel or reschedule your appointment, please contact [facility]  [phone] and follow the prompts.  Office hours are 8:00 a.m. to 4:30 p.m. Monday - Friday. Please note that voicemails left after 4:00 p.m. may not be returned until the following business day.  We are closed weekends and major holidays. You have access to a nurse at all times for urgent questions. Please call the main number to the clinic [phone] and follow the prompts.  For any non-urgent questions, you may also contact your provider using MyChart. We now offer e-Visits for anyone 2 and older to request care online for non-urgent symptoms. For details visit [email or web address].   Also download the MyChart app! Go to the app store, search "MyChart", open the app, select [facility], and log in with your MyChart username and password.  Due to Covid, a mask is required upon entering the hospital/clinic. If you do not have a mask, one will be given to you upon arrival. For doctor visits, patients may have 1 support person aged 1 or older with them. For treatment visits, patients cannot have anyone with them due to current Covid guidelines and our immunocompromised population.

## 2021-03-16 ENCOUNTER — Telehealth: Payer: Self-pay | Admitting: *Deleted

## 2021-03-16 DIAGNOSIS — R972 Elevated prostate specific antigen [PSA]: Secondary | ICD-10-CM

## 2021-03-16 NOTE — Telephone Encounter (Addendum)
Patient informed, had visit with Dr. Tasia Catchings has to continue anticoagulation thru the end of September. He would like to proceed with MRI.  Ordered MRI-scheduled follow up   ----- Message from Hollice Espy, MD sent at 03/11/2021  8:21 AM EDT ----- PSA continues to rise.  I do think you need a prostate biopsy.  We will need to get clearance to stop your anticoagulation or perhaps to a Lovenox bridge from your oncologist Dr. Tasia Catchings.  If she does not think that this is safe, the alternative would be to go ahead and get a prostate MRI.  Becca, please schedule prostate biopsy I reached out to Dr. Tasia Catchings to see if he can hold his anticoagulation.  If not, order the prostate MRI and have him follow-up with me.  Hollice Espy, MD

## 2021-04-05 ENCOUNTER — Inpatient Hospital Stay: Payer: 59

## 2021-04-05 ENCOUNTER — Encounter: Payer: Self-pay | Admitting: Oncology

## 2021-04-05 ENCOUNTER — Encounter: Payer: Self-pay | Admitting: *Deleted

## 2021-04-05 ENCOUNTER — Inpatient Hospital Stay (HOSPITAL_BASED_OUTPATIENT_CLINIC_OR_DEPARTMENT_OTHER): Payer: 59 | Admitting: Oncology

## 2021-04-05 VITALS — BP 109/83 | HR 94 | Temp 98.0°F | Resp 20 | Wt 136.8 lb

## 2021-04-05 DIAGNOSIS — E876 Hypokalemia: Secondary | ICD-10-CM

## 2021-04-05 DIAGNOSIS — C3491 Malignant neoplasm of unspecified part of right bronchus or lung: Secondary | ICD-10-CM

## 2021-04-05 DIAGNOSIS — Z5112 Encounter for antineoplastic immunotherapy: Secondary | ICD-10-CM

## 2021-04-05 DIAGNOSIS — Z5111 Encounter for antineoplastic chemotherapy: Secondary | ICD-10-CM

## 2021-04-05 DIAGNOSIS — I82412 Acute embolism and thrombosis of left femoral vein: Secondary | ICD-10-CM

## 2021-04-05 LAB — CBC WITH DIFFERENTIAL/PLATELET
Abs Immature Granulocytes: 0.05 10*3/uL (ref 0.00–0.07)
Basophils Absolute: 0.1 10*3/uL (ref 0.0–0.1)
Basophils Relative: 0 %
Eosinophils Absolute: 0.2 10*3/uL (ref 0.0–0.5)
Eosinophils Relative: 1 %
HCT: 43 % (ref 39.0–52.0)
Hemoglobin: 15.4 g/dL (ref 13.0–17.0)
Immature Granulocytes: 0 %
Lymphocytes Relative: 8 %
Lymphs Abs: 1 10*3/uL (ref 0.7–4.0)
MCH: 38.8 pg — ABNORMAL HIGH (ref 26.0–34.0)
MCHC: 35.8 g/dL (ref 30.0–36.0)
MCV: 108.3 fL — ABNORMAL HIGH (ref 80.0–100.0)
Monocytes Absolute: 1.1 10*3/uL — ABNORMAL HIGH (ref 0.1–1.0)
Monocytes Relative: 10 %
Neutro Abs: 9.5 10*3/uL — ABNORMAL HIGH (ref 1.7–7.7)
Neutrophils Relative %: 81 %
Platelets: 156 10*3/uL (ref 150–400)
RBC: 3.97 MIL/uL — ABNORMAL LOW (ref 4.22–5.81)
RDW: 11.1 % — ABNORMAL LOW (ref 11.5–15.5)
WBC: 11.8 10*3/uL — ABNORMAL HIGH (ref 4.0–10.5)
nRBC: 0 % (ref 0.0–0.2)

## 2021-04-05 LAB — TSH: TSH: 1.309 u[IU]/mL (ref 0.350–4.500)

## 2021-04-05 LAB — T4, FREE: Free T4: 0.83 ng/dL (ref 0.61–1.12)

## 2021-04-05 LAB — COMPREHENSIVE METABOLIC PANEL
ALT: 20 U/L (ref 0–44)
AST: 32 U/L (ref 15–41)
Albumin: 3.7 g/dL (ref 3.5–5.0)
Alkaline Phosphatase: 109 U/L (ref 38–126)
Anion gap: 11 (ref 5–15)
BUN: 17 mg/dL (ref 6–20)
CO2: 23 mmol/L (ref 22–32)
Calcium: 9.3 mg/dL (ref 8.9–10.3)
Chloride: 99 mmol/L (ref 98–111)
Creatinine, Ser: 1.23 mg/dL (ref 0.61–1.24)
GFR, Estimated: >60 mL/min (ref >60)
Glucose, Bld: 119 mg/dL — ABNORMAL HIGH (ref 70–99)
Potassium: 4.6 mmol/L (ref 3.5–5.1)
Sodium: 133 mmol/L — ABNORMAL LOW (ref 135–145)
Total Bilirubin: 0.7 mg/dL (ref 0.3–1.2)
Total Protein: 8 g/dL (ref 6.5–8.1)

## 2021-04-05 MED ORDER — BUDESONIDE-FORMOTEROL FUMARATE 80-4.5 MCG/ACT IN AERO: 2.0000 | INHALATION_SPRAY | Freq: Two times a day (BID) | RESPIRATORY_TRACT | 6 refills | Status: DC

## 2021-04-05 MED ORDER — HEPARIN SOD (PORK) LOCK FLUSH 100 UNIT/ML IV SOLN
500.0000 [IU] | Freq: Once | INTRAVENOUS | Status: AC | PRN
  Filled 2021-04-05: qty 5

## 2021-04-05 MED ORDER — ALTEPLASE 2 MG IJ SOLR
2.0000 mg | Freq: Once | INTRAMUSCULAR | Status: AC | PRN
  Administered 2021-04-05: 2 mg
  Filled 2021-04-05: qty 2

## 2021-04-05 MED ORDER — HEPARIN SOD (PORK) LOCK FLUSH 100 UNIT/ML IV SOLN
INTRAVENOUS | Status: AC
  Administered 2021-04-05: 500 [IU]
  Filled 2021-04-05: qty 5

## 2021-04-05 MED ORDER — SODIUM CHLORIDE 0.9 % IV SOLN
200.0000 mg | Freq: Once | INTRAVENOUS | Status: AC
  Administered 2021-04-05: 200 mg via INTRAVENOUS
  Filled 2021-04-05: qty 8

## 2021-04-05 MED ORDER — SODIUM CHLORIDE 0.9 % IV SOLN
Freq: Once | INTRAVENOUS | Status: AC
  Filled 2021-04-05: qty 250

## 2021-04-05 NOTE — Progress Notes (Signed)
Per Benjamine Mola RN per Dr. Tasia Catchings pt to receive Cath-Flo prior to [facility] treatment today. Okay to proceed with Cath-flo based on 01/06/21 dye study.

## 2021-04-05 NOTE — Progress Notes (Signed)
Hematology/Oncology Follow up note [facility] [phone] [phone]   Patient Care Team: Tracie Harrier, MD as PCP - General (Internal Medicine) Earlie Server, MD as Consulting Physician (Hematology and Oncology)  REASON FOR VISIT:  Follow up for treatment of squamous lung cancer.   HISTORY OF PRESENTING ILLNESS:  # Dec 2019 Stage I squamous lung cancer #s/p Bronchoscopy on 06/25/2018. subcarina EBUS FNA was non diagnostic, hypocellular specimen.  # 08/13/2018 CT guided right upper lobe biopsy pathology showed dense fibrosis and mixed inflammatory cells with prominent polytypic plasma cells component. Focal benign bronchial wall and alveolar spaces. No malignancy was identified.   # 08/13/2018 underwent right thoracotomy and wedge resection of a right upper lobe mass.  Frozen section was consistent with an inflammatory process.  On the second postop day, preliminary pathology reports high-grade malignancy.  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  08/20/2018 Patient was therefore taken back to the OR and underwent take complete lobectomy  pT1b pN0 cM0 stage I squamous cell lung cancer.  Margin is negative.  Recommend observation.    # 12/03/2018 CT chest w contrast showed local recurrence.  12/10/2018 PET showed No evidence of distant metastatic disease # 12/26/2018 s/p bronchoscopy biopsy. Confirmed local recurrence of squamous cell lung cancer.  medi port placed by Dr.Oaks.  # 06/14/2020 CT chest w contrast was reviewed and discussed with patient.  Evidence of progressive lymphangitic spread of tumor throughout the right lung, with contralateral lung nodules with  right pleural effusion  MRI brain is negative for CNS involvement.  CT abdomen with contrast showed no evidence of abdominal metastatic disease PET scan was not approved by insurance-peer to peer appeal with Dr.Vipul Bhanderi   # NGS - KRAS V14L,  TMB 26.6-high, MS stable, PD-L1  5% CDKN2C G48, DICER1c.2117-1G>T, FLT4 G1131S, NF1 A438f, NF1 L2643*, STK11 N1837fCancer treatment Started concurrent chemoradiation on 01/16/2019 Carboplatin AUC of 2 and Taxol 45 mg/m2 weekly finished in 03/05/2019 04/10/2019, patient started on durvalumab maintenance. -CT showed progression.  Insurance denied PET scan evaluation. 07/16/2020-09/28/2020 4 cycles of carboplatin/paclitaxel/Keytruda  10/15/2020, CT chest abdomen pelvis redemonstrated postoperative and postradiation appearance of the right chest with perihilar consolidation and fibrosis.  Slight interval decrease in size of the pulmonary nodules associated with interlobular septal thickening nearly complete resolution of previously seen left-sided pulmonary nodules.  Minimal irregular residual.  Right pleural effusion improved.  Consistent with treatment response.  No evidence of metastatic disease with the abdomen or pelvis.  None obstructive bilateral nephrolithiasis - partial response to the treatment- 10/19/2020, Keytruda monotherapy maintenance  12/15/2020, CT chest without contrast showed resolution of lung nodules, no evidence of recurrent disease 12/30/2020, patient was admitted due to unprovoked extensive deep vein thrombosis from calf vein to the common femoral vein on the left.  Patient was started on heparin drip.  Patient underwent thrombolysis and is a colectomy on 12/31/2020.  Anticoagulation was switched to Eliquis at discharge.  INTERVAL HISTORY Samuel HANNANs a [age].o. male who has above history reviewed by me today presents for reatment of  recurrent  squamous cell lung cancer. Patient has been on Keytruda maintenance.  Overall he tolerates well no new complaints. Patient finished 3 months of Eliquis 5 mg twice daily had been switched to Eliquis 2.5 mg daily. Today his port has no blood return but flushes well.     Review of Systems  Constitutional:  Negative for appetite change, chills, fatigue, fever and unexpected  weight change.  HENT:   Negative for hearing loss and voice change.   Eyes:  Negative for eye problems and icterus.  Respiratory:  Negative for chest tightness, cough and shortness of breath.   Cardiovascular:  Negative for chest pain and leg swelling.  Gastrointestinal:  Negative for abdominal distention, abdominal pain and nausea.  Endocrine: Negative for hot flashes.  Genitourinary:  Negative for difficulty urinating, dysuria and frequency.   Musculoskeletal:  Negative for arthralgias.  Skin:  Negative for itching and rash.  Neurological:  Negative for light-headedness and numbness.  Hematological:  Negative for adenopathy. Does not bruise/bleed easily.  Psychiatric/Behavioral:  Negative for confusion.    MEDICAL HISTORY:  Past Medical History:  Diagnosis Date   Alcohol abuse    usually drinks 2-3 drinks per day   Atherosclerosis 06/2018   Chronic sinusitis    Dehydration 02/07/2019   Emphysema of lung ([place]) 06/2018   patient unaware of this.   Hip fracture ([place]) 05/2018   no surgery   History of kidney stones 05/2018   per xray, bilateral nephrolitiasis   Hypertension    Squamous cell carcinoma of lung, right ([place]) 06/2018    SURGICAL HISTORY: Past Surgical History:  Procedure Laterality Date   BRAIN SURGERY  10/2017   nasal/sinus endoscopy. mass benign   ELECTROMAGNETIC NAVIGATION BROCHOSCOPY Right 06/25/2018   Procedure: ELECTROMAGNETIC NAVIGATION BRONCHOSCOPY;  Surgeon: [name], MD;  Location: [facility];  Service: Cardiopulmonary;  Laterality: Right;   ENDOBRONCHIAL ULTRASOUND Right 06/25/2018   Procedure: ENDOBRONCHIAL ULTRASOUND;  Surgeon: [name], MD;  Location: [facility];  Service: Cardiopulmonary;  Laterality: Right;   ENDOBRONCHIAL ULTRASOUND Right 12/26/2018   Procedure: ENDOBRONCHIAL ULTRASOUND RIGHT;  Surgeon: [name], MD;  Location: [facility];  Service: Cardiopulmonary;  Laterality: Right;   FLEXIBLE BRONCHOSCOPY N/A 08/20/2018   Procedure:  FLEXIBLE BRONCHOSCOPY PREOP;  Surgeon: [name], MD;  Location: [facility];  Service: Thoracic;  Laterality: N/A;   IR CV LINE INJECTION  04/04/2019   NASAL SINUS SURGERY  10/2017   At [facility], frontal sinusotomy, ethmoidectomy, resection anterior cranial fossa neoplasm, turbinate resection   PERIPHERAL VASCULAR THROMBECTOMY Left 12/31/2020   Procedure: PERIPHERAL VASCULAR THROMBECTOMY;  Surgeon: Algernon Huxley, MD;  Location: [place] [facility];  Service: Cardiovascular;  Laterality: Left;   PORTACATH PLACEMENT Left 01/15/2019   Procedure: INSERTION PORT-A-CATH;  Surgeon: [name], MD;  Location: [facility];  Service: General;  Laterality: Left;   THORACOTOMY Right 08/13/2018   Procedure: PREOP BROCHOSCOPY WITH RIGHT THORACOTOMY AND RUL RESECTION;  Surgeon: [name], MD;  Location: [facility];  Service: General;  Laterality: Right;   THORACOTOMY Right 08/20/2018   Procedure: THORACOTOMY MAJOR RIGHT UPPER LOBE LOBECTOMY;  Surgeon: [name], MD;  Location: [facility];  Service: Thoracic;  Laterality: Right;   TOE SURGERY Left    pin in left toe    SOCIAL HISTORY: Social History   Socioeconomic History   Marital status: Married    Spouse name: lisa   Number of children: Not on file   Years of education: Not on file   Highest education level: Not on file  Occupational History   Occupation: welding    Comment: taking time off to resolve issues  Tobacco Use   Smoking status: Former    Packs/day: 0.50    Years: 15.00    Pack years: 7.50    Types: Cigarettes    Quit date: 08/2018    Years since quitting: 2.6   Smokeless tobacco: Never  Vaping  Use   Vaping Use: Never used  Substance and Sexual Activity   Alcohol use: Yes    Alcohol/week: 3.0 standard drinks    Types: 3 Cans of beer per week    Comment: usually 2 drinks per week, per patient   Drug use: No   Sexual activity: Not on file  Other Topics Concern   Not on file  Social History Narrative   Not on file   Social  Determinants of Health   Financial Resource Strain: Not on file  Food Insecurity: Not on file  Transportation Needs: Not on file  Physical Activity: Not on file  Stress: Not on file  Social Connections: Not on file  Intimate Partner Violence: Not on file    FAMILY HISTORY: Family History  Problem Relation Age of Onset   Breast cancer Mother    Diabetes Mother    Lung cancer Father    Hypertension Father     ALLERGIES:  has No Known Allergies.  MEDICATIONS:  Current Outpatient Medications  Medication Sig Dispense Refill   albuterol (VENTOLIN HFA) 108 (90 Base) MCG/ACT inhaler TAKE 2 PUFFS BY MOUTH EVERY 6 HOURS AS NEEDED FOR WHEEZE OR SHORTNESS OF BREATH 6.7 each 6   apixaban (ELIQUIS) 2.5 MG TABS tablet Take 1 tablet (2.5 mg total) by mouth 2 (two) times daily. 60 tablet 3   diltiazem (CARDIZEM CD) 120 MG 24 hr capsule Take 120 mg by mouth daily.     folic acid (V-R FOLIC ACID) 811 MCG tablet Take 1 tablet (400 mcg total) by mouth daily. 90 tablet 1   hydrALAZINE (APRESOLINE) 100 MG tablet Take 50 mg by mouth 2 (two) times daily.     HYDROcodone-acetaminophen (NORCO/VICODIN) 5-325 MG tablet Take 1 tablet by mouth every 6 (six) hours as needed for severe pain. 10 tablet 0   loratadine (CLARITIN) 10 MG tablet Take 10 mg by mouth daily.     magnesium chloride (SLOW-MAG) 64 MG TBEC SR tablet Take 1 tablet (64 mg total) by mouth 2 (two) times daily. TAKE 1 TABLET (64 MG TOTAL) BY MOUTH DAILY. 60 tablet 1   methocarbamol (ROBAXIN) 500 MG tablet Take 1 tablet (500 mg total) by mouth at bedtime. 30 tablet 0   metoprolol succinate (TOPROL-XL) 25 MG 24 hr tablet Take 1 tablet (25 mg total) by mouth daily. 30 tablet 0   olmesartan (BENICAR) 40 MG tablet Take 40 mg by mouth every other day.      SPIRIVA HANDIHALER 18 MCG inhalation capsule INHALE 1 CAPSULE VIA HANDIHALER ONCE DAILY AT THE SAME TIME EVERY DAY 30 capsule 0   vitamin B-12 (CYANOCOBALAMIN) 1000 MCG tablet Take 1 tablet (1,000  mcg total) by mouth daily. 90 tablet 1   budesonide-formoterol (SYMBICORT) 80-4.5 MCG/ACT inhaler Inhale 2 puffs into the lungs 2 (two) times daily. in the morning and at bedtime. 10.2 each 6   ondansetron (ZOFRAN) 8 MG tablet Take 1 tablet (8 mg total) by mouth every 8 (eight) hours as needed for refractory nausea / vomiting. Start on day 3 after chemo. (Patient not taking: No sig reported) 90 tablet 3   prochlorperazine (COMPAZINE) 10 MG tablet Take 1 tablet (10 mg total) by mouth every 6 (six) hours as needed (Nausea or vomiting). (Patient not taking: No sig reported) 90 tablet 3   No current facility-administered medications for this visit.   Facility-Administered Medications Ordered in Other Visits  Medication Dose Route Frequency Provider Last Rate Last Admin   albuterol ([place]) (  2.5 MG/3ML) 0.083% nebulizer solution 2.5 mg  2.5 mg Nebulization Once System, Provider Not In       heparin lock flush 100 unit/mL  500 Units Intravenous Once Earlie Server, MD       heparin lock flush 100 unit/mL  500 Units Intracatheter Once PRN Earlie Server, MD       pembrolizumab [facility]) 200 mg in sodium chloride 0.9 % 50 mL chemo infusion  200 mg Intravenous Once Earlie Server, MD 116 mL/hr at 04/05/21 1156 200 mg at 04/05/21 1156   sodium chloride flush (NS) 0.9 % injection 10 mL  10 mL Intravenous PRN Earlie Server, MD   10 mL at 04/04/19 0903   sodium chloride flush (NS) 0.9 % injection 10 mL  10 mL Intravenous PRN Earlie Server, MD   10 mL at 07/26/20 1308     PHYSICAL EXAMINATION: ECOG PERFORMANCE STATUS: 1 - Symptomatic but completely ambulatory Vitals:   04/05/21 0907  BP: 109/83  Pulse: 94  Resp: 20  Temp: 98 F (36.7 C)  SpO2: 99%   Filed Weights   04/05/21 0907  Weight: 136 lb 12.8 oz (62.1 kg)    Physical Exam Constitutional:      General: He is not in acute distress. HENT:     Head: Normocephalic and atraumatic.  Eyes:     General: No scleral icterus. Cardiovascular:     Rate and Rhythm:  Normal rate and regular rhythm.     Heart sounds: Normal heart sounds.  Pulmonary:     Effort: Pulmonary effort is normal. No respiratory distress.     Breath sounds: No wheezing.  Abdominal:     General: Bowel sounds are normal. There is no distension.     Palpations: Abdomen is soft.  Musculoskeletal:        General: No deformity. Normal range of motion.     Cervical back: Normal range of motion and neck supple.  Skin:    General: Skin is warm and dry.     Findings: No erythema or rash.  Neurological:     Mental Status: He is alert and oriented to person, place, and time. Mental status is at baseline.     Cranial Nerves: No cranial nerve deficit.     Coordination: Coordination normal.  Psychiatric:        Mood and Affect: Mood normal.     LABORATORY DATA:  I have reviewed the data as listed Lab Results  Component Value Date   WBC 11.8 (H) 04/05/2021   HGB 15.4 04/05/2021   HCT 43.0 04/05/2021   MCV 108.3 (H) 04/05/2021   PLT 156 04/05/2021   Recent Labs    04/07/20 0854 04/22/20 0825 08/31/20 0812 09/07/20 1117 02/22/21 0819 03/15/21 0854 04/05/21 0840  NA 137   < > 137   < > 140 138 133*  K 3.7   < > 3.1*   < > 3.6 4.1 4.6  CL 104   < > 100   < > 101 104 99  CO2 21*   < > 25   < > _0 GLUCOSE 163*   < > 107*   < > 125* 109* 119*  BUN 11   < > 7   < > _1 CREATININE 1.10   < > 0.79   < > 1.01 0.98 1.23  CALCIUM 8.8*   < > 8.9   < > 9.5 9.0 9.3  GFRNONAA >60   < > >  60   < > >60 >60 >60  GFRAA >60  --   --   --   --   --   --   PROT 7.1   < > 7.4   < > 7.3 7.7 8.0  ALBUMIN 3.4*   < > 3.4*   < > 3.6 3.7 3.7  AST 25   < > 31   < > 21 24 32  ALT 19   < > 13   < > _0 ALKPHOS 76   < > 101   < > 84 77 109  BILITOT 0.5   < > 2.0*   < > 1.1 0.6 0.7  BILIDIR  --   --  0.4*  --   --   --   --    < > = values in this interval not displayed.    Iron/TIBC/Ferritin/ %Sat No results found for: IRON, TIBC, FERRITIN, IRONPCTSAT   RADIOGRAPHIC  STUDIES: I have personally reviewed the radiological images as listed and agreed with the findings in the report. US Venous Img Lower Unilateral Left  Result Date: 03/09/2021 CLINICAL DATA:  history of DVT, follow up EXAM: LEFT LOWER EXTREMITY VENOUS DOPPLER ULTRASOUND TECHNIQUE: Gray-scale sonography with graded compression, as well as color Doppler and duplex ultrasound were performed to evaluate the lower extremity deep venous systems from the level of the common femoral vein and including the common femoral, femoral, profunda femoral, popliteal and calf veins including the posterior tibial, peroneal and gastrocnemius veins when visible. The superficial great saphenous vein was also interrogated. Spectral Doppler was utilized to evaluate flow at rest and with distal augmentation maneuvers in the common femoral, femoral and popliteal veins. COMPARISON:  June 23 FINDINGS: Contralateral Common Femoral Vein: Respiratory phasicity is normal and symmetric with the symptomatic side. No evidence of thrombus. Normal compressibility. Common Femoral Vein: No evidence of thrombus. Normal compressibility, respiratory phasicity and response to augmentation. Saphenofemoral Junction: No evidence of thrombus. Normal compressibility and flow on color Doppler imaging. Profunda Femoral Vein: No evidence of thrombus. Normal compressibility and flow on color Doppler imaging. Femoral Vein: No evidence of thrombus. Normal compressibility, respiratory phasicity and response to augmentation. Popliteal Vein: Wall adherent nonocclusive DVT. Calf Veins: No evidence of thrombus. Normal compressibility and flow on color Doppler imaging. Other Findings:  None. IMPRESSION: Markedly improved patency of the left lower extremity deep venous system with small residual wall adherent nonocclusive thrombus limited to the popliteal vein. Electronically Signed   By: Albin Felling M.D.   On: 03/09/2021 13:45   DG Fluoro Guide CV Line Left  Result  Date: 01/06/2021 CLINICAL DATA:  History of lung carcinoma with indwelling left subclavian vein Port-A-Cath region placed in 2020. Recurrent problems with ability to aspirate blood from the port. EXAM: CONTRAST INJECTION OF PORT A CATH UNDER FLUOROSCOPY CONTRAST:  20 mL Omnipaque 180 FLUOROSCOPY TIME:  48 seconds.  48.3 mGy PROCEDURE: Contrast was administered via the indwelling port after it was accessed. Fluoroscopic spot images were obtained of the catheter during injection. FINDINGS: The left subclavian port demonstrates stable positioning with the catheter tip located at the level of the mid SVC. With port injection, there is no evidence of contrast extravasation. A very prominent fibrin sheath is present surrounding the distal segment of the catheter at the level of the SVC and brachiocephalic vein. Contrast does not exit the end of the catheter and rather exits perforations in the sheath at the level of the SVC  and brachiocephalic vein. Exiting contrast does flow freely in the SVC which shows no evidence of significant underlying stenosis. IMPRESSION: Very prominent occlusive fibrin sheath surrounding the distal segment of the indwelling port catheter in the SVC and brachiocephalic vein. Contrast does not exit the end hole but rather perforations in the fibrin sheath. Exiting contrast does flow freely in the SVC. Electronically Signed   By: Aletta Edouard M.D.   On: 01/06/2021 12:27      ASSESSMENT & PLAN:  1. Squamous cell carcinoma of lung, right ([place])   2. Encounter for antineoplastic immunotherapy   3. Acute deep vein thrombosis (DVT) of femoral vein of left lower extremity (HCC)   4. Encounter for antineoplastic chemotherapy   5. Hypokalemia   6. Hypomagnesemia    #Recurrent Squamous cell carcinoma of lung, stage IV S/p carboplatin Taxol and Keytruda, status post 4 cycles- partial response  Labs reviewed and discussed with patient .  Proceed with Keytruda treatment today .  Repeat CT  scan    #Unprovoked left lower extremity DVT, status post thrombolysis/thrombectomy. Continue Eliquis 2.5 mg twice daily.  #Chronic hypomagnesia.  Continue slow magnesium twice daily.   #Chronic hypokalemia, improved.  Potassium 4.6.  Continue potassium chloride 20 mEq twice daily.   #Port-A-Cath in place, dye study showed fibrin sheath Trial of Cath flo today.   #COPD/emphysema, continue Symbicort as instructed and albuterol as needed for wheezing.  Refilled prescription.  return of visit:  Follow-up in 3 weeks.   Earlie Server, MD, PhD Hematology Oncology [facility] 04/05/2021

## 2021-04-05 NOTE — Patient Instructions (Signed)
[facility]  Discharge Instructions: Thank you for choosing [facility] to provide your oncology and hematology care.  If you have a lab appointment with the [facility], please go directly to the [facility] and check in at the registration area.  Wear comfortable clothing and clothing appropriate for easy access to any Portacath or PICC line.   We strive to give you quality time with your provider. You may need to reschedule your appointment if you arrive late (15 or more minutes).  Arriving late affects you and other patients whose appointments are after yours.  Also, if you miss three or more appointments without notifying the office, you may be dismissed from the clinic at the provider's discretion.      For prescription refill requests, have your pharmacy contact our office and allow 72 hours for refills to be completed.    Today you received the following chemotherapy and/or immunotherapy agents Keytruda      To help prevent nausea and vomiting after your treatment, we encourage you to take your nausea medication as directed.  BELOW ARE SYMPTOMS THAT SHOULD BE REPORTED IMMEDIATELY: *FEVER GREATER THAN 100.4 F (38 C) OR HIGHER *CHILLS OR SWEATING *NAUSEA AND VOMITING THAT IS NOT CONTROLLED WITH YOUR NAUSEA MEDICATION *UNUSUAL SHORTNESS OF BREATH *UNUSUAL BRUISING OR BLEEDING *URINARY PROBLEMS (pain or burning when urinating, or frequent urination) *BOWEL PROBLEMS (unusual diarrhea, constipation, pain near the anus) TENDERNESS IN MOUTH AND THROAT WITH OR WITHOUT PRESENCE OF ULCERS (sore throat, sores in mouth, or a toothache) UNUSUAL RASH, SWELLING OR PAIN  UNUSUAL VAGINAL DISCHARGE OR ITCHING   Items with * indicate a potential emergency and should be followed up as soon as possible or go to the Emergency Department if any problems should occur.  Please show the CHEMOTHERAPY ALERT CARD or IMMUNOTHERAPY ALERT CARD at check-in to  the Emergency Department and triage nurse.  Should you have questions after your visit or need to cancel or reschedule your appointment, please contact [facility]  [phone] and follow the prompts.  Office hours are 8:00 a.m. to 4:30 p.m. Monday - Friday. Please note that voicemails left after 4:00 p.m. may not be returned until the following business day.  We are closed weekends and major holidays. You have access to a nurse at all times for urgent questions. Please call the main number to the clinic [phone] and follow the prompts.  For any non-urgent questions, you may also contact your provider using MyChart. We now offer e-Visits for anyone 3 and older to request care online for non-urgent symptoms. For details visit [email or web address].   Also download the MyChart app! Go to the app store, search "MyChart", open the app, select [facility], and log in with your MyChart username and password.  Due to Covid, a mask is required upon entering the hospital/clinic. If you do not have a mask, one will be given to you upon arrival. For doctor visits, patients may have 1 support person aged 22 or older with them. For treatment visits, patients cannot have anyone with them due to current Covid guidelines and our immunocompromised population.

## 2021-04-06 ENCOUNTER — Ambulatory Visit: Payer: Medicare Other | Admitting: Urology

## 2021-04-08 ENCOUNTER — Ambulatory Visit
Admission: RE | Admit: 2021-04-08 | Discharge: 2021-04-08 | Disposition: A | Discharge status: Home / Self Care | Payer: 59 | Source: Ambulatory Visit | Attending: Urology | Admitting: Urology

## 2021-04-08 ENCOUNTER — Other Ambulatory Visit: Payer: Self-pay

## 2021-04-08 DIAGNOSIS — R972 Elevated prostate specific antigen [PSA]: Secondary | ICD-10-CM | POA: Diagnosis present

## 2021-04-08 MED ORDER — GADOBUTROL 1 MMOL/ML IV SOLN
6.0000 mL | Freq: Once | INTRAVENOUS | Status: AC | PRN
  Administered 2021-04-08: 6 mL via INTRAVENOUS

## 2021-04-11 NOTE — Progress Notes (Signed)
04/12/21 11:54 AM   Samuel Choi 1965/07/25 [id number]  Referring provider:  Tracie Harrier, MD [address] [facility] [place],  [place] [address] Chief Complaint  Patient presents with   Elevated PSA     HPI: Samuel Choi is a 55 y.o.male with a personal history of elevated PSA who presents today for follow-up and prostate MRI results.   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  He also has a personal history of DVT status post thrombectomy and is currently on Eliquis.  10/15/2020  CT abdomen pelvis showed no evidence of metastatic disease within the abdomen or pelvis and non obstructive bilateral nephrolithiasis.   PSA on 03/08/2021 was 8.2.   MRI of prostate with/without contrast revealed PIRADS category 3 lesion in the right base peripheral zone.   He is doing well today with no new urinary symptoms.     PMH: Past Medical History:  Diagnosis Date   Alcohol abuse    usually drinks 2-3 drinks per day   Atherosclerosis 06/2018   Chronic sinusitis    Dehydration 02/07/2019   Emphysema of lung ([place]) 06/2018   patient unaware of this.   Hip fracture ([place]) 05/2018   no surgery   History of kidney stones 05/2018   per xray, bilateral nephrolitiasis   Hypertension    Squamous cell carcinoma of lung, right ([place]) 06/2018    Surgical History: Past Surgical History:  Procedure Laterality Date   BRAIN SURGERY  10/2017   nasal/sinus endoscopy. mass benign   ELECTROMAGNETIC NAVIGATION BROCHOSCOPY Right 06/25/2018   Procedure: ELECTROMAGNETIC NAVIGATION BRONCHOSCOPY;  Surgeon: [name], MD;  Location: [facility];  Service: Cardiopulmonary;  Laterality: Right;   ENDOBRONCHIAL ULTRASOUND Right 06/25/2018   Procedure: ENDOBRONCHIAL ULTRASOUND;  Surgeon: [name], MD;  Location: [facility];  Service: Cardiopulmonary;  Laterality: Right;   ENDOBRONCHIAL ULTRASOUND Right 12/26/2018   Procedure: ENDOBRONCHIAL  ULTRASOUND RIGHT;  Surgeon: [name], MD;  Location: [facility];  Service: Cardiopulmonary;  Laterality: Right;   FLEXIBLE BRONCHOSCOPY N/A 08/20/2018   Procedure: FLEXIBLE BRONCHOSCOPY PREOP;  Surgeon: [name], MD;  Location: [facility];  Service: Thoracic;  Laterality: N/A;   IR CV LINE INJECTION  04/04/2019   NASAL SINUS SURGERY  10/2017   At [facility], frontal sinusotomy, ethmoidectomy, resection anterior cranial fossa neoplasm, turbinate resection   PERIPHERAL VASCULAR THROMBECTOMY Left 12/31/2020   Procedure: PERIPHERAL VASCULAR THROMBECTOMY;  Surgeon: Algernon Huxley, MD;  Location: [place] [facility];  Service: Cardiovascular;  Laterality: Left;   PORTACATH PLACEMENT Left 01/15/2019   Procedure: INSERTION PORT-A-CATH;  Surgeon: [name], MD;  Location: [facility];  Service: General;  Laterality: Left;   THORACOTOMY Right 08/13/2018   Procedure: PREOP BROCHOSCOPY WITH RIGHT THORACOTOMY AND RUL RESECTION;  Surgeon: [name], MD;  Location: [facility];  Service: General;  Laterality: Right;   THORACOTOMY Right 08/20/2018   Procedure: THORACOTOMY MAJOR RIGHT UPPER LOBE LOBECTOMY;  Surgeon: [name], MD;  Location: [facility];  Service: Thoracic;  Laterality: Right;   TOE SURGERY Left    pin in left toe    Home Medications:  Allergies as of 04/12/2021   No Known Allergies      Medication List        Accurate as of April 12, 2021 11:54 AM. If you have any questions, ask your nurse or doctor.          albuterol 108 (90 Base) MCG/ACT inhaler Commonly  known as: VENTOLIN HFA TAKE 2 PUFFS BY MOUTH EVERY 6 HOURS AS NEEDED FOR WHEEZE OR SHORTNESS OF BREATH   apixaban 2.5 MG Tabs tablet Commonly known as: Eliquis Take 1 tablet (2.5 mg total) by mouth 2 (two) times daily.   budesonide-formoterol 80-4.5 MCG/ACT inhaler Commonly known as: Symbicort Inhale 2 puffs into the lungs 2 (two) times daily. in the morning and at bedtime.   diltiazem 120 MG 24 hr capsule Commonly known  as: CARDIZEM CD Take 120 mg by mouth daily.   folic acid 324 MCG tablet Commonly known as: V-R FOLIC ACID Take 1 tablet (400 mcg total) by mouth daily.   hydrALAZINE 100 MG tablet Commonly known as: APRESOLINE Take 50 mg by mouth 2 (two) times daily.   HYDROcodone-acetaminophen 5-325 MG tablet Commonly known as: NORCO/VICODIN Take 1 tablet by mouth every 6 (six) hours as needed for severe pain.   loratadine 10 MG tablet Commonly known as: CLARITIN Take 10 mg by mouth daily.   methocarbamol 500 MG tablet Commonly known as: ROBAXIN Take 1 tablet (500 mg total) by mouth at bedtime.   metoprolol succinate 25 MG 24 hr tablet Commonly known as: TOPROL-XL Take 1 tablet (25 mg total) by mouth daily.   olmesartan 40 MG tablet Commonly known as: BENICAR Take 40 mg by mouth every other day.   ondansetron 8 MG tablet Commonly known as: Zofran Take 1 tablet (8 mg total) by mouth every 8 (eight) hours as needed for refractory nausea / vomiting. Start on day 3 after chemo.   prochlorperazine 10 MG tablet Commonly known as: COMPAZINE Take 1 tablet (10 mg total) by mouth every 6 (six) hours as needed (Nausea or vomiting).   Slow-Mag 64 MG Tbec SR tablet Generic drug: magnesium chloride Take 1 tablet (64 mg total) by mouth 2 (two) times daily. TAKE 1 TABLET (64 MG TOTAL) BY MOUTH DAILY.   Spiriva HandiHaler 18 MCG inhalation capsule Generic drug: tiotropium INHALE 1 CAPSULE VIA HANDIHALER ONCE DAILY AT THE SAME TIME EVERY DAY   vitamin B-12 1000 MCG tablet Commonly known as: CYANOCOBALAMIN Take 1 tablet (1,000 mcg total) by mouth daily.        Allergies: No Known Allergies  Family History: Family History  Problem Relation Age of Onset   Breast cancer Mother    Diabetes Mother    Lung cancer Father    Hypertension Father     Social History:  reports that he quit smoking about 2 years ago. His smoking use included cigarettes. He has a 7.50 pack-year smoking history. He  has never used smokeless tobacco. He reports current alcohol use of about 3.0 standard drinks per week. He reports that he does not use drugs.   Physical Exam: BP (!) 173/100   Pulse 96   Ht _0  (1.854 m)   Wt 142 lb (64.4 kg)   BMI 18.73 kg/m   Constitutional:  Alert and oriented, No acute distress. HEENT: [place] AT, moist mucus membranes.  Trachea midline, no masses. Cardiovascular: No clubbing, cyanosis, or edema. Respiratory: Normal respiratory effort, no increased work of breathing. Skin: No rashes, bruises or suspicious lesions. Neurologic: Grossly intact, no focal deficits, moving all 4 extremities. Psychiatric: Normal mood and affect.  Laboratory Data:  Lab Results  Component Value Date   CREATININE 1.23 04/05/2021    Lab Results  Component Value Date   HGBA1C 4.4 07/04/2014     Pertinent Imaging: CLINICAL DATA:  Elevated PSA in a 55 year old male also  with history of lung cancer. Most recent PSA of 8.2   EXAM: MR PROSTATE WITHOUT AND WITH CONTRAST   TECHNIQUE: Multiplanar multisequence MRI images were obtained of the pelvis centered about the prostate. Pre and post contrast images were obtained.   CONTRAST:  61m GADAVIST GADOBUTROL 1 MMOL/ML IV SOLN   COMPARISON:  CT chest, abdomen and pelvis of April 2022.   FINDINGS: Prostate:   Transitional zone: Signs of BPH without visible high-risk lesion.   Peripheral zone: Linear and wedge-shaped areas of T2 hypointensity throughout the peripheral zone. Findings of low signal in the peripheral zone at the RIGHT base on T2 slightly more focal, not associated with restricted diffusion. PIRADS category 3 (image 36/13 and image 14/42)   Volume:  21 cc   PSA density: 0.37 ng/mL   Transcapsular spread:  Absent   Seminal vesicle involvement: Absent   Neurovascular bundle involvement: Absent   Pelvic adenopathy: Absent   Bone metastasis: Absent   Other findings: Sigmoid diverticular disease.    IMPRESSION: PIRADS category 3 lesion in the RIGHT base peripheral zone as discussed.   Elevated PSA density as discussed above. Findings may warrant close follow-up. Would also correlate with any recent symptoms that would suggest prostatitis.     Electronically Signed   By: GZetta BillsM.D.   On: 04/09/2021 15:42   I have personally reviewed the images and agree with radiologist interpretation.    Assessment & Plan:   Elevated PSA  - PSA still markedly elevated  -Prostate MRI was reviewed viewed today, he has a PI-RADS 3 lesion which is somewhat equivocal.  No other evidence of high-grade disease, disease abutting the capsule or evidence of extraprostatic extension and no lymphadenopathy all of which are reassuring  -We discussed the risk and benefits of pursuing biopsy at this time.  In light of recent DVT and need for continuous anticoagulation, we agreed that based on these MRI findings, we can hold off for now but reconsider 6 months from now if his PSA continues to trend upwards.  He is agreeable this plan.  He understands the risks.   2. History of DVT -AS above  - will defer prostate biopsy at this time   F/u 6 months with PSA prior  I,Kailey Littlejohn,acting as a scribe for AHollice Espy MD.,have documented all relevant documentation on the behalf of AHollice Espy MD,as directed by  AHollice Espy MD while in the presence of AHollice Espy MD.  I have reviewed the above documentation for accuracy and completeness, and I agree with the above.   AHollice Espy MD  [facility] [address] [place] [phone]

## 2021-04-12 ENCOUNTER — Ambulatory Visit (INDEPENDENT_AMBULATORY_CARE_PROVIDER_SITE_OTHER): Payer: 59 | Admitting: Urology

## 2021-04-12 ENCOUNTER — Other Ambulatory Visit: Payer: Self-pay

## 2021-04-12 VITALS — BP 173/100 | HR 96 | Ht 73.0 in | Wt 142.0 lb

## 2021-04-12 DIAGNOSIS — R972 Elevated prostate specific antigen [PSA]: Secondary | ICD-10-CM | POA: Diagnosis not present

## 2021-04-22 ENCOUNTER — Other Ambulatory Visit: Payer: Self-pay

## 2021-04-22 ENCOUNTER — Ambulatory Visit
Admission: RE | Admit: 2021-04-22 | Discharge: 2021-04-22 | Disposition: A | Discharge status: Home / Self Care | Payer: 59 | Source: Ambulatory Visit | Attending: Oncology | Admitting: Oncology

## 2021-04-22 ENCOUNTER — Telehealth: Payer: Self-pay | Admitting: Oncology

## 2021-04-22 ENCOUNTER — Telehealth: Payer: Self-pay | Admitting: *Deleted

## 2021-04-22 DIAGNOSIS — C3491 Malignant neoplasm of unspecified part of right bronchus or lung: Secondary | ICD-10-CM | POA: Diagnosis not present

## 2021-04-22 DIAGNOSIS — I2699 Other pulmonary embolism without acute cor pulmonale: Secondary | ICD-10-CM

## 2021-04-22 HISTORY — DX: Other pulmonary embolism without acute cor pulmonale: I26.99

## 2021-04-22 MED ORDER — IOHEXOL 350 MG/ML SOLN
75.0000 mL | Freq: Once | INTRAVENOUS | Status: AC | PRN
  Administered 2021-04-22: 75 mL via INTRAVENOUS

## 2021-04-22 NOTE — Telephone Encounter (Signed)
Called report  IMPRESSION: 1. Nonocclusive pulmonary embolus in lobar right middle and lower lobe pulmonary arteries. Thrombus appears adherent in some regions, potentially nonacute. 2. Interval progression of consolidative airspace opacity in the medial right middle lobe with bandlike opacity and bronchiectasis in the parahilar right lung. Imaging features are compatible with evolving post treatment change although recurrent disease anteriorly is not excluded. 3. Interval development of numerous bilateral pulmonary nodules, measuring up to 11 mm in the right lung base. Imaging features are highly concerning for metastatic disease. 4. Tiny right pleural effusion. 5. Aortic Atherosclerosis (ICD10-I70.0).     Electronically Signed   By: Misty Stanley M.D.   On: 04/22/2021 12:47

## 2021-04-22 NOTE — Telephone Encounter (Signed)
Talk to radiology about CT scan results.  I Called patient and communicated the CT results. he denies any shortness of breath at this time.  Currently he is on Eliquis 2.5 mg twice daily.I recommend patient to increase to Eliquis 5 mg twice daily.  And if he feels more shortness of breath, go to emergency room.  He voices understanding and agrees with the plan. There is disease progression.  I plan to resume him on carboplatin/Taxol in addition to [facility].

## 2021-04-22 NOTE — Telephone Encounter (Signed)
Per MD, patient had some progression, she has called him and increased eliquis to 5 mg BID.   Add Carboplatin + Taxol to Keytruda to his next visit.

## 2021-04-26 ENCOUNTER — Inpatient Hospital Stay: Payer: 59

## 2021-04-26 ENCOUNTER — Inpatient Hospital Stay (HOSPITAL_BASED_OUTPATIENT_CLINIC_OR_DEPARTMENT_OTHER): Payer: 59 | Admitting: Oncology

## 2021-04-26 ENCOUNTER — Encounter: Payer: Self-pay | Admitting: Oncology

## 2021-04-26 ENCOUNTER — Other Ambulatory Visit: Payer: Self-pay

## 2021-04-26 ENCOUNTER — Inpatient Hospital Stay: Payer: 59 | Attending: Oncology

## 2021-04-26 VITALS — BP 122/76 | HR 83 | Temp 97.9°F | Wt 134.0 lb

## 2021-04-26 VITALS — BP 123/83 | HR 85 | Temp 96.2°F | Resp 18 | Wt 134.0 lb

## 2021-04-26 DIAGNOSIS — Z5111 Encounter for antineoplastic chemotherapy: Secondary | ICD-10-CM | POA: Diagnosis present

## 2021-04-26 DIAGNOSIS — Z5112 Encounter for antineoplastic immunotherapy: Secondary | ICD-10-CM | POA: Diagnosis not present

## 2021-04-26 DIAGNOSIS — Z7901 Long term (current) use of anticoagulants: Secondary | ICD-10-CM | POA: Diagnosis not present

## 2021-04-26 DIAGNOSIS — Z86711 Personal history of pulmonary embolism: Secondary | ICD-10-CM | POA: Diagnosis not present

## 2021-04-26 DIAGNOSIS — K219 Gastro-esophageal reflux disease without esophagitis: Secondary | ICD-10-CM | POA: Insufficient documentation

## 2021-04-26 DIAGNOSIS — C3411 Malignant neoplasm of upper lobe, right bronchus or lung: Secondary | ICD-10-CM | POA: Insufficient documentation

## 2021-04-26 DIAGNOSIS — I2782 Chronic pulmonary embolism: Secondary | ICD-10-CM | POA: Diagnosis not present

## 2021-04-26 DIAGNOSIS — R112 Nausea with vomiting, unspecified: Secondary | ICD-10-CM | POA: Diagnosis not present

## 2021-04-26 DIAGNOSIS — Z7952 Long term (current) use of systemic steroids: Secondary | ICD-10-CM | POA: Diagnosis not present

## 2021-04-26 DIAGNOSIS — C3491 Malignant neoplasm of unspecified part of right bronchus or lung: Secondary | ICD-10-CM | POA: Diagnosis not present

## 2021-04-26 DIAGNOSIS — Z79899 Other long term (current) drug therapy: Secondary | ICD-10-CM | POA: Diagnosis not present

## 2021-04-26 DIAGNOSIS — I10 Essential (primary) hypertension: Secondary | ICD-10-CM | POA: Diagnosis not present

## 2021-04-26 DIAGNOSIS — Z7951 Long term (current) use of inhaled steroids: Secondary | ICD-10-CM | POA: Insufficient documentation

## 2021-04-26 DIAGNOSIS — Z86718 Personal history of other venous thrombosis and embolism: Secondary | ICD-10-CM | POA: Insufficient documentation

## 2021-04-26 DIAGNOSIS — I82412 Acute embolism and thrombosis of left femoral vein: Secondary | ICD-10-CM

## 2021-04-26 DIAGNOSIS — E876 Hypokalemia: Secondary | ICD-10-CM | POA: Insufficient documentation

## 2021-04-26 DIAGNOSIS — Z87891 Personal history of nicotine dependence: Secondary | ICD-10-CM | POA: Insufficient documentation

## 2021-04-26 LAB — COMPREHENSIVE METABOLIC PANEL
ALT: 19 U/L (ref 0–44)
AST: 28 U/L (ref 15–41)
Albumin: 3.5 g/dL (ref 3.5–5.0)
Alkaline Phosphatase: 111 U/L (ref 38–126)
Anion gap: 7 (ref 5–15)
BUN: 13 mg/dL (ref 6–20)
CO2: 23 mmol/L (ref 22–32)
Calcium: 9 mg/dL (ref 8.9–10.3)
Chloride: 102 mmol/L (ref 98–111)
Creatinine, Ser: 1.18 mg/dL (ref 0.61–1.24)
GFR, Estimated: >60 mL/min (ref >60)
Glucose, Bld: 164 mg/dL — ABNORMAL HIGH (ref 70–99)
Potassium: 4 mmol/L (ref 3.5–5.1)
Sodium: 132 mmol/L — ABNORMAL LOW (ref 135–145)
Total Bilirubin: 0.7 mg/dL (ref 0.3–1.2)
Total Protein: 7.8 g/dL (ref 6.5–8.1)

## 2021-04-26 LAB — CBC WITH DIFFERENTIAL/PLATELET
Abs Immature Granulocytes: 0.05 10*3/uL (ref 0.00–0.07)
Basophils Absolute: 0.1 10*3/uL (ref 0.0–0.1)
Basophils Relative: 1 %
Eosinophils Absolute: 0.2 10*3/uL (ref 0.0–0.5)
Eosinophils Relative: 1 %
HCT: 40.1 % (ref 39.0–52.0)
Hemoglobin: 14.2 g/dL (ref 13.0–17.0)
Immature Granulocytes: 0 %
Lymphocytes Relative: 8 %
Lymphs Abs: 1 10*3/uL (ref 0.7–4.0)
MCH: 36.9 pg — ABNORMAL HIGH (ref 26.0–34.0)
MCHC: 35.4 g/dL (ref 30.0–36.0)
MCV: 104.2 fL — ABNORMAL HIGH (ref 80.0–100.0)
Monocytes Absolute: 1.1 10*3/uL — ABNORMAL HIGH (ref 0.1–1.0)
Monocytes Relative: 9 %
Neutro Abs: 10.2 10*3/uL — ABNORMAL HIGH (ref 1.7–7.7)
Neutrophils Relative %: 81 %
Platelets: 142 10*3/uL — ABNORMAL LOW (ref 150–400)
RBC: 3.85 MIL/uL — ABNORMAL LOW (ref 4.22–5.81)
RDW: 11 % — ABNORMAL LOW (ref 11.5–15.5)
WBC: 12.6 10*3/uL — ABNORMAL HIGH (ref 4.0–10.5)
nRBC: 0 % (ref 0.0–0.2)

## 2021-04-26 MED ORDER — PALONOSETRON HCL INJECTION 0.25 MG/5ML
0.2500 mg | Freq: Once | INTRAVENOUS | Status: AC
  Administered 2021-04-26: 0.25 mg via INTRAVENOUS
  Filled 2021-04-26: qty 5

## 2021-04-26 MED ORDER — SODIUM CHLORIDE 0.9 % IV SOLN
200.0000 mg/m2 | Freq: Once | INTRAVENOUS | Status: AC
  Administered 2021-04-26: 354 mg via INTRAVENOUS
  Filled 2021-04-26: qty 59

## 2021-04-26 MED ORDER — FAMOTIDINE 20 MG IN NS 100 ML IVPB
20.0000 mg | Freq: Once | INTRAVENOUS | Status: AC
  Administered 2021-04-26: 20 mg via INTRAVENOUS
  Filled 2021-04-26: qty 20

## 2021-04-26 MED ORDER — SODIUM CHLORIDE 0.9 % IV SOLN
200.0000 mg | Freq: Once | INTRAVENOUS | Status: AC
  Administered 2021-04-26: 200 mg via INTRAVENOUS
  Filled 2021-04-26: qty 8

## 2021-04-26 MED ORDER — HEPARIN SOD (PORK) LOCK FLUSH 100 UNIT/ML IV SOLN
500.0000 [IU] | Freq: Once | INTRAVENOUS | Status: AC
  Filled 2021-04-26: qty 5

## 2021-04-26 MED ORDER — SODIUM CHLORIDE 0.9 % IV SOLN
Freq: Once | INTRAVENOUS | Status: AC
  Filled 2021-04-26: qty 250

## 2021-04-26 MED ORDER — SODIUM CHLORIDE 0.9% FLUSH
10.0000 mL | INTRAVENOUS | Status: DC | PRN
  Administered 2021-04-26: 10 mL via INTRAVENOUS
  Filled 2021-04-26: qty 10

## 2021-04-26 MED ORDER — DIPHENHYDRAMINE HCL 50 MG/ML IJ SOLN
50.0000 mg | Freq: Once | INTRAMUSCULAR | Status: AC
  Administered 2021-04-26: 50 mg via INTRAVENOUS
  Filled 2021-04-26: qty 1

## 2021-04-26 MED ORDER — HEPARIN SOD (PORK) LOCK FLUSH 100 UNIT/ML IV SOLN
500.0000 [IU] | Freq: Once | INTRAVENOUS | Status: DC | PRN
  Filled 2021-04-26: qty 5

## 2021-04-26 MED ORDER — SODIUM CHLORIDE 0.9 % IV SOLN
10.0000 mg | Freq: Once | INTRAVENOUS | Status: AC
  Administered 2021-04-26: 10 mg via INTRAVENOUS
  Filled 2021-04-26: qty 10

## 2021-04-26 MED ORDER — SODIUM CHLORIDE 0.9 % IV SOLN
430.0000 mg | Freq: Once | INTRAVENOUS | Status: AC
  Administered 2021-04-26: 430 mg via INTRAVENOUS
  Filled 2021-04-26: qty 43

## 2021-04-26 MED ORDER — HEPARIN SOD (PORK) LOCK FLUSH 100 UNIT/ML IV SOLN
INTRAVENOUS | Status: AC
  Administered 2021-04-26: 500 [IU] via INTRAVENOUS
  Filled 2021-04-26: qty 5

## 2021-04-26 MED ORDER — SODIUM CHLORIDE 0.9 % IV SOLN
150.0000 mg | Freq: Once | INTRAVENOUS | Status: AC
  Administered 2021-04-26: 150 mg via INTRAVENOUS
  Filled 2021-04-26: qty 150

## 2021-04-26 NOTE — Patient Instructions (Signed)
[facility]  Discharge Instructions: Thank you for choosing [facility] to provide your oncology and hematology care.  If you have a lab appointment with the [facility], please go directly to the [facility] and check in at the registration area.  Wear comfortable clothing and clothing appropriate for easy access to any Portacath or PICC line.   We strive to give you quality time with your provider. You may need to reschedule your appointment if you arrive late (15 or more minutes).  Arriving late affects you and other patients whose appointments are after yours.  Also, if you miss three or more appointments without notifying the office, you may be dismissed from the clinic at the provider's discretion.      For prescription refill requests, have your pharmacy contact our office and allow 72 hours for refills to be completed.    Today you received the following chemotherapy and/or immunotherapy agents Keytruda, Taxol and Carboplatin        To help prevent nausea and vomiting after your treatment, we encourage you to take your nausea medication as directed.  BELOW ARE SYMPTOMS THAT SHOULD BE REPORTED IMMEDIATELY: *FEVER GREATER THAN 100.4 F (38 C) OR HIGHER *CHILLS OR SWEATING *NAUSEA AND VOMITING THAT IS NOT CONTROLLED WITH YOUR NAUSEA MEDICATION *UNUSUAL SHORTNESS OF BREATH *UNUSUAL BRUISING OR BLEEDING *URINARY PROBLEMS (pain or burning when urinating, or frequent urination) *BOWEL PROBLEMS (unusual diarrhea, constipation, pain near the anus) TENDERNESS IN MOUTH AND THROAT WITH OR WITHOUT PRESENCE OF ULCERS (sore throat, sores in mouth, or a toothache) UNUSUAL RASH, SWELLING OR PAIN  UNUSUAL VAGINAL DISCHARGE OR ITCHING   Items with * indicate a potential emergency and should be followed up as soon as possible or go to the Emergency Department if any problems should occur.  Please show the CHEMOTHERAPY ALERT CARD or IMMUNOTHERAPY  ALERT CARD at check-in to the Emergency Department and triage nurse.  Should you have questions after your visit or need to cancel or reschedule your appointment, please contact [facility]  [phone] and follow the prompts.  Office hours are 8:00 a.m. to 4:30 p.m. Monday - Friday. Please note that voicemails left after 4:00 p.m. may not be returned until the following business day.  We are closed weekends and major holidays. You have access to a nurse at all times for urgent questions. Please call the main number to the clinic [phone] and follow the prompts.  For any non-urgent questions, you may also contact your provider using MyChart. We now offer e-Visits for anyone 42 and older to request care online for non-urgent symptoms. For details visit [email or web address].   Also download the MyChart app! Go to the app store, search "MyChart", open the app, select [facility], and log in with your MyChart username and password.  Due to Covid, a mask is required upon entering the hospital/clinic. If you do not have a mask, one will be given to you upon arrival. For doctor visits, patients may have 1 support person aged 73 or older with them. For treatment visits, patients cannot have anyone with them due to current Covid guidelines and our immunocompromised population.

## 2021-04-26 NOTE — Progress Notes (Signed)
Hematology/Oncology Follow up note [facility] [phone] [phone]   Patient Care Team: Tracie Harrier, MD as PCP - General (Internal Medicine) Earlie Server, MD as Consulting Physician (Hematology and Oncology)  REASON FOR VISIT:  Follow up for treatment of squamous lung cancer.   HISTORY OF PRESENTING ILLNESS:  # Dec 2019 Stage I squamous lung cancer #s/p Bronchoscopy on 06/25/2018. subcarina EBUS FNA was non diagnostic, hypocellular specimen.  # 08/13/2018 CT guided right upper lobe biopsy pathology showed dense fibrosis and mixed inflammatory cells with prominent polytypic plasma cells component. Focal benign bronchial wall and alveolar spaces. No malignancy was identified.   # 08/13/2018 underwent right thoracotomy and wedge resection of a right upper lobe mass.  Frozen section was consistent with an inflammatory process.  On the second postop day, preliminary pathology reports high-grade malignancy.  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  08/20/2018 Patient was therefore taken back to the OR and underwent take complete lobectomy  pT1b pN0 cM0 stage I squamous cell lung cancer.  Margin is negative.  Recommend observation.    # 12/03/2018 CT chest w contrast showed local recurrence.  12/10/2018 PET showed No evidence of distant metastatic disease # 12/26/2018 s/p bronchoscopy biopsy. Confirmed local recurrence of squamous cell lung cancer.  medi port placed by Dr.Oaks.  # 06/14/2020 CT chest w contrast was reviewed and discussed with patient.  Evidence of progressive lymphangitic spread of tumor throughout the right lung, with contralateral lung nodules with  right pleural effusion  MRI brain is negative for CNS involvement.  CT abdomen with contrast showed no evidence of abdominal metastatic disease PET scan was not approved by insurance-peer to peer appeal with Dr.Vipul Bhanderi   # NGS - KRAS V14L,  TMB 26.6-high, MS stable, PD-L1  5% CDKN2C G48, DICER1c.2117-1G>T, FLT4 G1131S, NF1 A461f, NF1 L2643*, STK11 [id number] treatment Started concurrent chemoradiation on 01/16/2019 Carboplatin AUC of 2 and Taxol 45 mg/m2 weekly finished in 03/05/2019 04/10/2019, patient started on durvalumab maintenance. -CT showed progression.  Insurance denied PET scan evaluation. 07/16/2020-09/28/2020 4 cycles of carboplatin/paclitaxel/Keytruda  10/15/2020, CT chest abdomen pelvis redemonstrated postoperative and postradiation appearance of the right chest with perihilar consolidation and fibrosis.  Slight interval decrease in size of the pulmonary nodules associated with interlobular septal thickening nearly complete resolution of previously seen left-sided pulmonary nodules.  Minimal irregular residual.  Right pleural effusion improved.  Consistent with treatment response.  No evidence of metastatic disease with the abdomen or pelvis.  None obstructive bilateral nephrolithiasis - partial response to the treatment- 10/19/2020, Keytruda monotherapy maintenance  12/15/2020, CT chest without contrast showed resolution of lung nodules, no evidence of recurrent disease 12/30/2020, patient was admitted due to unprovoked extensive deep vein thrombosis from calf vein to the common femoral vein on the left.  Patient was started on heparin drip.  Patient underwent thrombolysis and is a colectomy on 12/31/2020.  Anticoagulation was switched to Eliquis at discharge.  INTERVAL HISTORY Samuel Choi a [age].o. male who has above history reviewed by me today presents for reatment of  recurrent  squamous cell lung cancer. During interval patient has had a CT scan done to monitor disease status.  I called patient and discussed about her CT results.  Unfortunately there was disease progression.  In addition there was pulmonary embolism noted on CT chest.  Probably not an acute. I called patient and advised patient to increase Eliquis to 5 mg twice daily. He tolerates  current dose. Patient  denies shortness of breath.  With additional questioning, he admits that he has shortness of breath with exertion.  He seems to have lost some weight as well.  He has been followed by nutritionist and is taking nutrition supplements.     Review of Systems  Constitutional:  Negative for appetite change, chills, fatigue, fever and unexpected weight change.  HENT:   Negative for hearing loss and voice change.   Eyes:  Negative for eye problems and icterus.  Respiratory:  Positive for shortness of breath. Negative for chest tightness and cough.   Cardiovascular:  Negative for chest pain and leg swelling.  Gastrointestinal:  Negative for abdominal distention, abdominal pain and nausea.  Endocrine: Negative for hot flashes.  Genitourinary:  Negative for difficulty urinating, dysuria and frequency.   Musculoskeletal:  Negative for arthralgias.  Skin:  Negative for itching and rash.  Neurological:  Negative for light-headedness and numbness.  Hematological:  Negative for adenopathy. Does not bruise/bleed easily.  Psychiatric/Behavioral:  Negative for confusion.    MEDICAL HISTORY:  Past Medical History:  Diagnosis Date   Alcohol abuse    usually drinks 2-3 drinks per day   Atherosclerosis 06/2018   Chronic sinusitis    Dehydration 02/07/2019   Emphysema of lung ([place]) 06/2018   patient unaware of this.   Hip fracture ([place]) 05/2018   no surgery   History of kidney stones 05/2018   per xray, bilateral nephrolitiasis   Hypertension    Squamous cell carcinoma of lung, right ([place]) 06/2018    SURGICAL HISTORY: Past Surgical History:  Procedure Laterality Date   BRAIN SURGERY  10/2017   nasal/sinus endoscopy. mass benign   ELECTROMAGNETIC NAVIGATION BROCHOSCOPY Right 06/25/2018   Procedure: ELECTROMAGNETIC NAVIGATION BRONCHOSCOPY;  Surgeon: [name], MD;  Location: [facility];  Service: Cardiopulmonary;  Laterality: Right;   ENDOBRONCHIAL ULTRASOUND Right  06/25/2018   Procedure: ENDOBRONCHIAL ULTRASOUND;  Surgeon: [name], MD;  Location: [facility];  Service: Cardiopulmonary;  Laterality: Right;   ENDOBRONCHIAL ULTRASOUND Right 12/26/2018   Procedure: ENDOBRONCHIAL ULTRASOUND RIGHT;  Surgeon: [name], MD;  Location: [facility];  Service: Cardiopulmonary;  Laterality: Right;   FLEXIBLE BRONCHOSCOPY N/A 08/20/2018   Procedure: FLEXIBLE BRONCHOSCOPY PREOP;  Surgeon: [name], MD;  Location: [facility];  Service: Thoracic;  Laterality: N/A;   IR CV LINE INJECTION  04/04/2019   NASAL SINUS SURGERY  10/2017   At [facility], frontal sinusotomy, ethmoidectomy, resection anterior cranial fossa neoplasm, turbinate resection   PERIPHERAL VASCULAR THROMBECTOMY Left 12/31/2020   Procedure: PERIPHERAL VASCULAR THROMBECTOMY;  Surgeon: Algernon Huxley, MD;  Location: [place] [facility];  Service: Cardiovascular;  Laterality: Left;   PORTACATH PLACEMENT Left 01/15/2019   Procedure: INSERTION PORT-A-CATH;  Surgeon: [name], MD;  Location: [facility];  Service: General;  Laterality: Left;   THORACOTOMY Right 08/13/2018   Procedure: PREOP BROCHOSCOPY WITH RIGHT THORACOTOMY AND RUL RESECTION;  Surgeon: [name], MD;  Location: [facility];  Service: General;  Laterality: Right;   THORACOTOMY Right 08/20/2018   Procedure: THORACOTOMY MAJOR RIGHT UPPER LOBE LOBECTOMY;  Surgeon: [name], MD;  Location: [facility];  Service: Thoracic;  Laterality: Right;   TOE SURGERY Left    pin in left toe    SOCIAL HISTORY: Social History   Socioeconomic History   Marital status: Married    Spouse name: lisa   Number of children: Not on file   Years of education: Not on file   Highest education level: Not on file  Occupational  History   Occupation: welding    Comment: taking time off to resolve issues  Tobacco Use   Smoking status: Former    Packs/day: 0.50    Years: 15.00    Pack years: 7.50    Types: Cigarettes    Quit date: 08/2018    Years since quitting: 2.7    Smokeless tobacco: Never  Vaping Use   Vaping Use: Never used  Substance and Sexual Activity   Alcohol use: Yes    Alcohol/week: 3.0 standard drinks    Types: 3 Cans of beer per week    Comment: usually 2 drinks per week, per patient   Drug use: No   Sexual activity: Not on file  Other Topics Concern   Not on file  Social History Narrative   Not on file   Social Determinants of Health   Financial Resource Strain: Not on file  Food Insecurity: Not on file  Transportation Needs: Not on file  Physical Activity: Not on file  Stress: Not on file  Social Connections: Not on file  Intimate Partner Violence: Not on file    FAMILY HISTORY: Family History  Problem Relation Age of Onset   Breast cancer Mother    Diabetes Mother    Lung cancer Father    Hypertension Father     ALLERGIES:  has No Known Allergies.  MEDICATIONS:  Current Outpatient Medications  Medication Sig Dispense Refill   albuterol (VENTOLIN HFA) 108 (90 Base) MCG/ACT inhaler TAKE 2 PUFFS BY MOUTH EVERY 6 HOURS AS NEEDED FOR WHEEZE OR SHORTNESS OF BREATH 6.7 each 6   apixaban (ELIQUIS) 2.5 MG TABS tablet Take 1 tablet (2.5 mg total) by mouth 2 (two) times daily. (Patient taking differently: Take 5 mg by mouth 2 (two) times daily.) 60 tablet 3   budesonide-formoterol (SYMBICORT) 80-4.5 MCG/ACT inhaler Inhale 2 puffs into the lungs 2 (two) times daily. in the morning and at bedtime. 10.2 each 6   diltiazem (CARDIZEM CD) 120 MG 24 hr capsule Take 120 mg by mouth daily.     folic acid (V-R FOLIC ACID) 194 MCG tablet Take 1 tablet (400 mcg total) by mouth daily. 90 tablet 1   hydrALAZINE (APRESOLINE) 100 MG tablet Take 50 mg by mouth 2 (two) times daily.     HYDROcodone-acetaminophen (NORCO/VICODIN) 5-325 MG tablet Take 1 tablet by mouth every 6 (six) hours as needed for severe pain. 10 tablet 0   loratadine (CLARITIN) 10 MG tablet Take 10 mg by mouth daily.     magnesium chloride (SLOW-MAG) 64 MG TBEC SR tablet  Take 1 tablet (64 mg total) by mouth 2 (two) times daily. TAKE 1 TABLET (64 MG TOTAL) BY MOUTH DAILY. 60 tablet 1   methocarbamol (ROBAXIN) 500 MG tablet Take 1 tablet (500 mg total) by mouth at bedtime. 30 tablet 0   metoprolol succinate (TOPROL-XL) 25 MG 24 hr tablet Take 1 tablet (25 mg total) by mouth daily. 30 tablet 0   olmesartan (BENICAR) 40 MG tablet Take 40 mg by mouth every other day.      ondansetron (ZOFRAN) 8 MG tablet Take 1 tablet (8 mg total) by mouth every 8 (eight) hours as needed for refractory nausea / vomiting. Start on day 3 after chemo. 90 tablet 3   prochlorperazine (COMPAZINE) 10 MG tablet Take 1 tablet (10 mg total) by mouth every 6 (six) hours as needed (Nausea or vomiting). 90 tablet 3   SPIRIVA HANDIHALER 18 MCG inhalation capsule INHALE 1 CAPSULE  VIA HANDIHALER ONCE DAILY AT THE SAME TIME EVERY DAY 30 capsule 0   vitamin B-12 (CYANOCOBALAMIN) 1000 MCG tablet Take 1 tablet (1,000 mcg total) by mouth daily. 90 tablet 1   No current facility-administered medications for this visit.   Facility-Administered Medications Ordered in Other Visits  Medication Dose Route Frequency Provider Last Rate Last Admin   albuterol (PROVENTIL) (2.5 MG/3ML) 0.083% nebulizer solution 2.5 mg  2.5 mg Nebulization Once System, Provider Not In       CARBOplatin (PARAPLATIN) 430 mg in sodium chloride 0.9 % 250 mL chemo infusion  430 mg Intravenous Once Earlie Server, MD       heparin lock flush 100 UNIT/ML injection            heparin lock flush 100 unit/mL  500 Units Intravenous Once Earlie Server, MD       heparin lock flush 100 unit/mL  500 Units Intravenous Once Earlie Server, MD       heparin lock flush 100 unit/mL  500 Units Intracatheter Once PRN Earlie Server, MD       PACLitaxel (TAXOL) 354 mg in sodium chloride 0.9 % 500 mL chemo infusion (> 37m/m2)  200 mg/m2 (Order-Specific) Intravenous Once YEarlie Server MD 47 mL/hr at 04/26/21 1136 354 mg at 04/26/21 1136   sodium chloride flush (NS) 0.9 % injection 10  mL  10 mL Intravenous PRN YEarlie Server MD   10 mL at 04/04/19 0903   sodium chloride flush (NS) 0.9 % injection 10 mL  10 mL Intravenous PRN YEarlie Server MD   10 mL at 07/26/20 1308   sodium chloride flush (NS) 0.9 % injection 10 mL  10 mL Intravenous PRN YEarlie Server MD   10 mL at 04/26/21 0808     PHYSICAL EXAMINATION: ECOG PERFORMANCE STATUS: 1 - Symptomatic but completely ambulatory Vitals:   04/26/21 0832  BP: 122/76  Pulse: 83  Temp: 97.9 F (36.6 C)  SpO2: 99%   Filed Weights   04/26/21 0832  Weight: 134 lb (60.8 kg)    Physical Exam Constitutional:      General: He is not in acute distress. HENT:     Head: Normocephalic and atraumatic.  Eyes:     General: No scleral icterus. Cardiovascular:     Rate and Rhythm: Normal rate and regular rhythm.     Heart sounds: Normal heart sounds.  Pulmonary:     Effort: Pulmonary effort is normal. No respiratory distress.     Breath sounds: No wheezing.     Comments: Decreased breath sound bilaterally Abdominal:     General: Bowel sounds are normal. There is no distension.     Palpations: Abdomen is soft.  Musculoskeletal:        General: No deformity. Normal range of motion.     Cervical back: Normal range of motion and neck supple.  Skin:    General: Skin is warm and dry.     Findings: No erythema or rash.  Neurological:     Mental Status: He is alert and oriented to person, place, and time. Mental status is at baseline.     Cranial Nerves: No cranial nerve deficit.     Coordination: Coordination normal.  Psychiatric:        Mood and Affect: Mood normal.     LABORATORY DATA:  I have reviewed the data as listed Lab Results  Component Value Date   WBC 12.6 (H) 04/26/2021   HGB 14.2 04/26/2021   HCT 40.1  04/26/2021   MCV 104.2 (H) 04/26/2021   PLT 142 (L) 04/26/2021   Recent Labs    08/31/20 7893 09/07/20 1117 03/15/21 0854 04/05/21 0840 04/26/21 0808  NA 137   < > 138 133* 132*  K 3.1*   < > 4.1 4.6 4.0  CL 100    < > 104 99 102  CO2 25   < > _0 GLUCOSE 107*   < > 109* 119* 164*  BUN 7   < > _1 CREATININE 0.79   < > 0.98 1.23 1.18  CALCIUM 8.9   < > 9.0 9.3 9.0  GFRNONAA >60   < > >60 >60 >60  PROT 7.4   < > 7.7 8.0 7.8  ALBUMIN 3.4*   < > 3.7 3.7 3.5  AST 31   < > 24 32 28  ALT 13   < > _2 ALKPHOS 101   < > 77 109 111  BILITOT 2.0*   < > 0.6 0.7 0.7  BILIDIR 0.4*  --   --   --   --    < > = values in this interval not displayed.    Iron/TIBC/Ferritin/ %Sat No results found for: IRON, TIBC, FERRITIN, IRONPCTSAT   RADIOGRAPHIC STUDIES: I have personally reviewed the radiological images as listed and agreed with the findings in the report. CT CHEST W CONTRAST  Addendum Date: 04/22/2021   ADDENDUM REPORT: 04/22/2021 13:25 ADDENDUM: Critical Value/emergent results were called by telephone at the time of interpretation on 04/22/2021 at 1:24 pm to provider ZHOU YU , who verbally acknowledged these results. Electronically Signed   By: Misty Stanley M.D.   On: 04/22/2021 13:25   Result Date: 04/22/2021 CLINICAL DATA:  Lung cancer restaging. EXAM: CT CHEST WITH CONTRAST TECHNIQUE: Multidetector CT imaging of the chest was performed during intravenous contrast administration. CONTRAST:  59m OMNIPAQUE IOHEXOL 350 MG/ML SOLN COMPARISON:  12/15/2020 FINDINGS: Cardiovascular: The heart size is normal. No substantial pericardial effusion. Mild atherosclerotic calcification is noted in the wall of the thoracic aorta. Left Port-A-Cath tip is positioned in the mid to distal SVC. Nonocclusive pulmonary embolus is identified in lobar right middle and lower lobe pulmonary arteries. Thrombus has a adherent quality in some regions suggesting that this may be nonacute. Mediastinum/Nodes: No mediastinal lymphadenopathy. There is no hilar lymphadenopathy. Post treatment changes in the parahilar right lung are similar to prior study performed without intravenous contrast material. The esophagus has  normal imaging features. There is no axillary lymphadenopathy. Lungs/Pleura: Interval progression of consolidative airspace opacity in the medial right middle lobe with bandlike opacity and bronchiectasis in the parahilar right lung. Volume loss right hemithorax is compatible with upper lobectomy. Nodular areas of airspace disease in the posterior right lung are similar to prior. The patient has a new irregular 11 mm nodule in the anterior right lung base with central cavitation (103/3). Multiple new tiny nodules are seen in the right lung base measuring 3-4 mm (see images 89, 97, 101, and 109. Numerous new pulmonary nodules in the left lung are also evident ranging in size from 3 mm up to about 4 mm maximum size (99/3). Tiny right pleural effusion. Upper Abdomen: Unremarkable. Musculoskeletal: No worrisome lytic or sclerotic osseous abnormality. IMPRESSION: 1. Nonocclusive pulmonary embolus in lobar right middle and lower lobe pulmonary arteries. Thrombus appears adherent in some regions, potentially nonacute. 2. Interval progression of consolidative airspace opacity in the medial right middle lobe with  bandlike opacity and bronchiectasis in the parahilar right lung. Imaging features are compatible with evolving post treatment change although recurrent disease anteriorly is not excluded. 3. Interval development of numerous bilateral pulmonary nodules, measuring up to 11 mm in the right lung base. Imaging features are highly concerning for metastatic disease. 4. Tiny right pleural effusion. 5. Aortic Atherosclerosis (ICD10-I70.0). Electronically Signed: By: Misty Stanley M.D. On: 04/22/2021 12:47   MR PROSTATE W WO CONTRAST  Result Date: 04/09/2021 CLINICAL DATA:  Elevated PSA in a 55 year old male also with history of lung cancer. Most recent PSA of 8.2 EXAM: MR PROSTATE WITHOUT AND WITH CONTRAST TECHNIQUE: Multiplanar multisequence MRI images were obtained of the pelvis centered about the prostate. Pre and  post contrast images were obtained. CONTRAST:  58m GADAVIST GADOBUTROL 1 MMOL/ML IV SOLN COMPARISON:  CT chest, abdomen and pelvis of April 2022. FINDINGS: Prostate: Transitional zone: Signs of BPH without visible high-risk lesion. Peripheral zone: Linear and wedge-shaped areas of T2 hypointensity throughout the peripheral zone. Findings of low signal in the peripheral zone at the RIGHT base on T2 slightly more focal, not associated with restricted diffusion. PIRADS category 3 (image 36/13 and image 14/42) Volume:  21 cc PSA density: 0.37 ng/mL Transcapsular spread:  Absent Seminal vesicle involvement: Absent Neurovascular bundle involvement: Absent Pelvic adenopathy: Absent Bone metastasis: Absent Other findings: Sigmoid diverticular disease. IMPRESSION: PIRADS category 3 lesion in the RIGHT base peripheral zone as discussed. Elevated PSA density as discussed above. Findings may warrant close follow-up. Would also correlate with any recent symptoms that would suggest prostatitis. Electronically Signed   By: GZetta BillsM.D.   On: 04/09/2021 15:42   UKoreaVenous Img Lower Unilateral Left  Result Date: 03/09/2021 CLINICAL DATA:  history of DVT, follow up EXAM: LEFT LOWER EXTREMITY VENOUS DOPPLER ULTRASOUND TECHNIQUE: Gray-scale sonography with graded compression, as well as color Doppler and duplex ultrasound were performed to evaluate the lower extremity deep venous systems from the level of the common femoral vein and including the common femoral, femoral, profunda femoral, popliteal and calf veins including the posterior tibial, peroneal and gastrocnemius veins when visible. The superficial great saphenous vein was also interrogated. Spectral Doppler was utilized to evaluate flow at rest and with distal augmentation maneuvers in the common femoral, femoral and popliteal veins. COMPARISON:  June 23 FINDINGS: Contralateral Common Femoral Vein: Respiratory phasicity is normal and symmetric with the symptomatic  side. No evidence of thrombus. Normal compressibility. Common Femoral Vein: No evidence of thrombus. Normal compressibility, respiratory phasicity and response to augmentation. Saphenofemoral Junction: No evidence of thrombus. Normal compressibility and flow on color Doppler imaging. Profunda Femoral Vein: No evidence of thrombus. Normal compressibility and flow on color Doppler imaging. Femoral Vein: No evidence of thrombus. Normal compressibility, respiratory phasicity and response to augmentation. Popliteal Vein: Wall adherent nonocclusive DVT. Calf Veins: No evidence of thrombus. Normal compressibility and flow on color Doppler imaging. Other Findings:  None. IMPRESSION: Markedly improved patency of the left lower extremity deep venous system with small residual wall adherent nonocclusive thrombus limited to the popliteal vein. Electronically Signed   By: YAlbin FellingM.D.   On: 03/09/2021 13:45      ASSESSMENT & PLAN:  1. Encounter for antineoplastic immunotherapy   2. Squamous cell carcinoma of lung, right ([place]   3. Acute deep vein thrombosis (DVT) of femoral vein of left lower extremity (HCC)   4. Other chronic pulmonary embolism, unspecified whether acute cor pulmonale present ([place]   5. Encounter for antineoplastic  chemotherapy    #Recurrent Squamous cell carcinoma of lung, stage IV 07/16/2020-09/28/2020 carboplatin Taxol and Keytruda x 1  partial response -Keytruda maintenance.-04/22/2021, progression- Labs are reviewed and discussed with patient. 04/22/2021, CT chest with contrast showed none occlusive pulmonary embolism, Interval progression of consolidation airspace opacity in the medial right middle lobe with bandlike opacity and bronchiectasis in the perihilar right lung,-this is compatible with evolving posttreatment changes although recurrent disease anteriorly is not excluded. Interval development of numerous bilateral pulmonary nodules, measuring up to 11 mm in the right lung base.   Concerning for metastatic disease.  Tiny right pleural effusion.  Image was reviewed with patient and I recommend patient to restart chemotherapy with carboplatin/Taxol, continue Keytruda. Plan reimage after 2 cycles. He agrees with the plan. Proceed with carboplatin/Taxol/Keytruda today. Patient has previously developed treatment induced neutropenia had received short acting G-CSF.   Repeat CBC in 10 days.  Plan to add long-acting G-CSF prophylactic for next cycle of treatment.  #Shortness of breath with exertion.  Combination of COPD/emphysema/lung cancer. Continue albuterol as needed, Symbicort   #Unprovoked left lower extremity DVT, status post thrombolysis/thrombectomy. Nonocclusive PE noted on recent CT. Of increased Eliquis to therapeutic dose-5 mg twice daily.  He tolerates well.  #Chronic hypomagnesia.  Continue slow magnesium twice daily.   #Chronic hypokalemia, improved.  Potassium 4.6.  Continue potassium chloride 20 mEq twice daily.    return of visit: Lab seen 10 days.  Follow-up in 3 weeks.  Lab MD chemo   Earlie Server, MD, PhD Hematology [facility] 04/26/2021

## 2021-04-27 ENCOUNTER — Other Ambulatory Visit: Payer: Self-pay | Admitting: Oncology

## 2021-04-29 ENCOUNTER — Inpatient Hospital Stay (HOSPITAL_BASED_OUTPATIENT_CLINIC_OR_DEPARTMENT_OTHER): Payer: 59 | Admitting: Oncology

## 2021-04-29 ENCOUNTER — Telehealth: Payer: Self-pay | Admitting: *Deleted

## 2021-04-29 ENCOUNTER — Other Ambulatory Visit: Payer: Self-pay

## 2021-04-29 ENCOUNTER — Ambulatory Visit: Payer: Medicare Other

## 2021-04-29 ENCOUNTER — Inpatient Hospital Stay: Payer: 59

## 2021-04-29 VITALS — BP 113/83 | HR 81 | Temp 97.2°F | Resp 16

## 2021-04-29 DIAGNOSIS — C3491 Malignant neoplasm of unspecified part of right bronchus or lung: Secondary | ICD-10-CM

## 2021-04-29 DIAGNOSIS — R112 Nausea with vomiting, unspecified: Secondary | ICD-10-CM

## 2021-04-29 DIAGNOSIS — Z5112 Encounter for antineoplastic immunotherapy: Secondary | ICD-10-CM | POA: Diagnosis not present

## 2021-04-29 DIAGNOSIS — Z95828 Presence of other vascular implants and grafts: Secondary | ICD-10-CM | POA: Diagnosis not present

## 2021-04-29 LAB — CBC WITH DIFFERENTIAL/PLATELET
Abs Immature Granulocytes: 0.03 10*3/uL (ref 0.00–0.07)
Basophils Absolute: 0.1 10*3/uL (ref 0.0–0.1)
Basophils Relative: 1 %
Eosinophils Absolute: 0.5 10*3/uL (ref 0.0–0.5)
Eosinophils Relative: 4 %
HCT: 38.1 % — ABNORMAL LOW (ref 39.0–52.0)
Hemoglobin: 13.7 g/dL (ref 13.0–17.0)
Immature Granulocytes: 0 %
Lymphocytes Relative: 5 %
Lymphs Abs: 0.6 10*3/uL — ABNORMAL LOW (ref 0.7–4.0)
MCH: 37.5 pg — ABNORMAL HIGH (ref 26.0–34.0)
MCHC: 36 g/dL (ref 30.0–36.0)
MCV: 104.4 fL — ABNORMAL HIGH (ref 80.0–100.0)
Monocytes Absolute: 0.3 10*3/uL (ref 0.1–1.0)
Monocytes Relative: 3 %
Neutro Abs: 10.7 10*3/uL — ABNORMAL HIGH (ref 1.7–7.7)
Neutrophils Relative %: 87 %
Platelets: 136 10*3/uL — ABNORMAL LOW (ref 150–400)
RBC: 3.65 MIL/uL — ABNORMAL LOW (ref 4.22–5.81)
RDW: 11.2 % — ABNORMAL LOW (ref 11.5–15.5)
WBC: 12.2 10*3/uL — ABNORMAL HIGH (ref 4.0–10.5)
nRBC: 0 % (ref 0.0–0.2)

## 2021-04-29 LAB — COMPREHENSIVE METABOLIC PANEL
ALT: 19 U/L (ref 0–44)
AST: 25 U/L (ref 15–41)
Albumin: 3.6 g/dL (ref 3.5–5.0)
Alkaline Phosphatase: 85 U/L (ref 38–126)
Anion gap: 10 (ref 5–15)
BUN: 24 mg/dL — ABNORMAL HIGH (ref 6–20)
CO2: 24 mmol/L (ref 22–32)
Calcium: 9.3 mg/dL (ref 8.9–10.3)
Chloride: 98 mmol/L (ref 98–111)
Creatinine, Ser: 0.93 mg/dL (ref 0.61–1.24)
GFR, Estimated: >60 mL/min (ref >60)
Glucose, Bld: 100 mg/dL — ABNORMAL HIGH (ref 70–99)
Potassium: 4.1 mmol/L (ref 3.5–5.1)
Sodium: 132 mmol/L — ABNORMAL LOW (ref 135–145)
Total Bilirubin: 1.1 mg/dL (ref 0.3–1.2)
Total Protein: 7.7 g/dL (ref 6.5–8.1)

## 2021-04-29 MED ORDER — HEPARIN SOD (PORK) LOCK FLUSH 100 UNIT/ML IV SOLN
500.0000 [IU] | Freq: Once | INTRAVENOUS | Status: AC
  Administered 2021-04-29: 500 [IU] via INTRAVENOUS
  Filled 2021-04-29: qty 5

## 2021-04-29 MED ORDER — SODIUM CHLORIDE 0.9 % IV SOLN
INTRAVENOUS | Status: DC
  Filled 2021-04-29 (×2): qty 250

## 2021-04-29 MED ORDER — DEXAMETHASONE SODIUM PHOSPHATE 10 MG/ML IJ SOLN
INTRAMUSCULAR | Status: AC
  Filled 2021-04-29: qty 1

## 2021-04-29 MED ORDER — PROMETHAZINE HCL 25 MG PO TABS: 25.0000 mg | ORAL_TABLET | Freq: Four times a day (QID) | ORAL | 0 refills | Status: DC | PRN

## 2021-04-29 MED ORDER — FAMOTIDINE 20 MG IN NS 100 ML IVPB
20.0000 mg | Freq: Once | INTRAVENOUS | Status: AC
  Administered 2021-04-29: 20 mg via INTRAVENOUS
  Filled 2021-04-29: qty 20

## 2021-04-29 MED ORDER — SODIUM CHLORIDE 0.9 % IV SOLN
12.5000 mg | Freq: Four times a day (QID) | INTRAVENOUS | Status: DC | PRN
  Administered 2021-04-29: 12.5 mg via INTRAVENOUS
  Filled 2021-04-29: qty 0.5

## 2021-04-29 MED ORDER — DEXAMETHASONE SODIUM PHOSPHATE 10 MG/ML IJ SOLN
10.0000 mg | Freq: Once | INTRAMUSCULAR | Status: AC
  Administered 2021-04-29: 10 mg via INTRAVENOUS

## 2021-04-29 MED ORDER — SODIUM CHLORIDE 0.9 % IV SOLN
INTRAVENOUS | Status: DC
  Filled 2021-04-29: qty 250

## 2021-04-29 MED ORDER — PANTOPRAZOLE SODIUM 20 MG PO TBEC: 20.0000 mg | DELAYED_RELEASE_TABLET | Freq: Every day | ORAL | 2 refills | Status: DC

## 2021-04-29 NOTE — Progress Notes (Signed)
Pt presents to [facility] with c/o nausea, vomiting, and "acid reflux", for 2 days. Has taken PRN meds without effectiveness. Reports poor intake. Denies pain.  Verbalized improvement of nausea/vomiting after administration of dexamethasone, pepcid, and phenergan.

## 2021-04-29 NOTE — Progress Notes (Signed)
Symptom Management Consult note [facility]  [phone] [phone]  Patient Care Team: Tracie Harrier, MD as PCP - General (Internal Medicine) Earlie Server, MD as Consulting Physician (Hematology and Oncology)   Name of the patient: Samuel Choi  [id number]  1965/08/10   Date of visit: 04/29/2021   Diagnosis-squamous cell lung cancer  Chief complaint/ Reason for visit-nausea and vomiting/hiccups  Heme/Onc history: Samuel Choi is a 55 year old male with past medical history significant for hypertension, acute DVT, sinusitis, COPD who is followed by Dr. Tasia Catchings for right lung cancer.  He is status post 13 cycles of Keytruda with follow-up imaging on 04/22/2021 that showed possible progression with increase in bilateral pulmonary nodules and consolidative airspace opacity. CarboTaxol + [name] was added to his treatment plan.Marland Kitchen  His first cycle was on 04/26/2021.    Interval history-patient presents to symptom management today for intractable nausea and vomiting and hiccups.  Symptoms started following treatment 2 days ago.  He has tried Zofran and Compazine without relief.  States he has been unable to eat and drink well over the past 2 days.  Reports acid reflux symptoms.  Currently not on prophylactic GERD medication.  Denies any fevers.  Reports several episodes of vomiting daily.  Denies any constipation or diarrhea.   ECOG FS:1 - Symptomatic but completely ambulatory  Review of systems- Review of Systems  Constitutional:  Positive for malaise/fatigue. Negative for chills, fever and weight loss.  HENT:  Negative for congestion, ear pain and tinnitus.   Eyes: Negative.  Negative for blurred vision and double vision.  Respiratory: Negative.  Negative for cough, sputum production and shortness of breath.   Cardiovascular: Negative.  Negative for chest pain, palpitations and leg swelling.  Gastrointestinal:  Positive for heartburn, nausea and  vomiting. Negative for abdominal pain, constipation and diarrhea.  Genitourinary:  Negative for dysuria, frequency and urgency.  Musculoskeletal:  Negative for back pain and falls.  Skin: Negative.  Negative for rash.  Neurological: Negative.  Negative for weakness and headaches.  Endo/Heme/Allergies: Negative.  Does not bruise/bleed easily.  Psychiatric/Behavioral: Negative.  Negative for depression. The patient is not nervous/anxious and does not have insomnia.     Current treatment-status post cycle 1 carbo Taxol plus Keytruda on 04/26/2021.  No Known Allergies   Past Medical History:  Diagnosis Date   Alcohol abuse    usually drinks 2-3 drinks per day   Atherosclerosis 06/2018   Chronic sinusitis    Dehydration 02/07/2019   Emphysema of lung ([place]) 06/2018   patient unaware of this.   Hip fracture ([place]) 05/2018   no surgery   History of kidney stones 05/2018   per xray, bilateral nephrolitiasis   Hypertension    Squamous cell carcinoma of lung, right ([place]) 06/2018     Past Surgical History:  Procedure Laterality Date   BRAIN SURGERY  10/2017   nasal/sinus endoscopy. mass benign   ELECTROMAGNETIC NAVIGATION BROCHOSCOPY Right 06/25/2018   Procedure: ELECTROMAGNETIC NAVIGATION BRONCHOSCOPY;  Surgeon: [name], MD;  Location: [facility];  Service: Cardiopulmonary;  Laterality: Right;   ENDOBRONCHIAL ULTRASOUND Right 06/25/2018   Procedure: ENDOBRONCHIAL ULTRASOUND;  Surgeon: [name], MD;  Location: [facility];  Service: Cardiopulmonary;  Laterality: Right;   ENDOBRONCHIAL ULTRASOUND Right 12/26/2018   Procedure: ENDOBRONCHIAL ULTRASOUND RIGHT;  Surgeon: [name], MD;  Location: [facility];  Service: Cardiopulmonary;  Laterality: Right;   FLEXIBLE BRONCHOSCOPY N/A 08/20/2018   Procedure: FLEXIBLE BRONCHOSCOPY PREOP;  Surgeon: [name], MD;  Location: [facility];  Service: Thoracic;  Laterality: N/A;   IR CV LINE INJECTION  04/04/2019   NASAL SINUS SURGERY  10/2017    At [facility], frontal sinusotomy, ethmoidectomy, resection anterior cranial fossa neoplasm, turbinate resection   PERIPHERAL VASCULAR THROMBECTOMY Left 12/31/2020   Procedure: PERIPHERAL VASCULAR THROMBECTOMY;  Surgeon: Algernon Huxley, MD;  Location: [place] [facility];  Service: Cardiovascular;  Laterality: Left;   PORTACATH PLACEMENT Left 01/15/2019   Procedure: INSERTION PORT-A-CATH;  Surgeon: [name], MD;  Location: [facility];  Service: General;  Laterality: Left;   THORACOTOMY Right 08/13/2018   Procedure: PREOP BROCHOSCOPY WITH RIGHT THORACOTOMY AND RUL RESECTION;  Surgeon: [name], MD;  Location: [facility];  Service: General;  Laterality: Right;   THORACOTOMY Right 08/20/2018   Procedure: THORACOTOMY MAJOR RIGHT UPPER LOBE LOBECTOMY;  Surgeon: [name], MD;  Location: [facility];  Service: Thoracic;  Laterality: Right;   TOE SURGERY Left    pin in left toe    Social History   Socioeconomic History   Marital status: Married    Spouse name: lisa   Number of children: Not on file   Years of education: Not on file   Highest education level: Not on file  Occupational History   Occupation: welding    Comment: taking time off to resolve issues  Tobacco Use   Smoking status: Former    Packs/day: 0.50    Years: 15.00    Pack years: 7.50    Types: Cigarettes    Quit date: 08/2018    Years since quitting: 2.7   Smokeless tobacco: Never  Vaping Use   Vaping Use: Never used  Substance and Sexual Activity   Alcohol use: Yes    Alcohol/week: 3.0 standard drinks    Types: 3 Cans of beer per week    Comment: usually 2 drinks per week, per patient   Drug use: No   Sexual activity: Not on file  Other Topics Concern   Not on file  Social History Narrative   Not on file   Social Determinants of Health   Financial Resource Strain: Not on file  Food Insecurity: Not on file  Transportation Needs: Not on file  Physical Activity: Not on file  Stress: Not on file  Social  Connections: Not on file  Intimate Partner Violence: Not on file    Family History  Problem Relation Age of Onset   Breast cancer Mother    Diabetes Mother    Lung cancer Father    Hypertension Father      Current Outpatient Medications:    pantoprazole (PROTONIX) 20 MG tablet, Take 1 tablet (20 mg total) by mouth daily., Disp: 60 tablet, Rfl: 2   promethazine (PHENERGAN) 25 MG tablet, Take 1 tablet (25 mg total) by mouth every 6 (six) hours as needed for nausea or vomiting., Disp: 30 tablet, Rfl: 0   albuterol (VENTOLIN HFA) 108 (90 Base) MCG/ACT inhaler, INHALE 2 PUFFS BY MOUTH EVERY 6 HOURS AS NEEDED FOR WHEEZE OR SHORTNESS OF BREATH, Disp: 8.5 g, Rfl: 5   apixaban (ELIQUIS) 2.5 MG TABS tablet, Take 1 tablet (2.5 mg total) by mouth 2 (two) times daily. (Patient taking differently: Take 5 mg by mouth 2 (two) times daily.), Disp: 60 tablet, Rfl: 3   budesonide-formoterol (SYMBICORT) 80-4.5 MCG/ACT inhaler, Inhale 2 puffs into the lungs 2 (two) times daily. in the morning and at bedtime., Disp: 10.2 each, Rfl: 6   diltiazem (CARDIZEM CD) 120 MG 24  hr capsule, Take 120 mg by mouth daily., Disp: , Rfl:    folic acid (V-R FOLIC ACID) 741 MCG tablet, Take 1 tablet (400 mcg total) by mouth daily., Disp: 90 tablet, Rfl: 1   hydrALAZINE (APRESOLINE) 100 MG tablet, Take 50 mg by mouth 2 (two) times daily., Disp: , Rfl:    HYDROcodone-acetaminophen (NORCO/VICODIN) 5-325 MG tablet, Take 1 tablet by mouth every 6 (six) hours as needed for severe pain., Disp: 10 tablet, Rfl: 0   loratadine (CLARITIN) 10 MG tablet, Take 10 mg by mouth daily., Disp: , Rfl:    magnesium chloride (SLOW-MAG) 64 MG TBEC SR tablet, Take 1 tablet (64 mg total) by mouth 2 (two) times daily. TAKE 1 TABLET (64 MG TOTAL) BY MOUTH DAILY., Disp: 60 tablet, Rfl: 1   methocarbamol (ROBAXIN) 500 MG tablet, Take 1 tablet (500 mg total) by mouth at bedtime., Disp: 30 tablet, Rfl: 0   metoprolol succinate (TOPROL-XL) 25 MG 24 hr tablet,  Take 1 tablet (25 mg total) by mouth daily., Disp: 30 tablet, Rfl: 0   olmesartan (BENICAR) 40 MG tablet, Take 40 mg by mouth every other day. , Disp: , Rfl:    ondansetron (ZOFRAN) 8 MG tablet, Take 1 tablet (8 mg total) by mouth every 8 (eight) hours as needed for refractory nausea / vomiting. Start on day 3 after chemo., Disp: 90 tablet, Rfl: 3   SPIRIVA HANDIHALER 18 MCG inhalation capsule, INHALE 1 CAPSULE VIA HANDIHALER ONCE DAILY AT THE SAME TIME EVERY DAY, Disp: 30 capsule, Rfl: 0   vitamin B-12 (CYANOCOBALAMIN) 1000 MCG tablet, Take 1 tablet (1,000 mcg total) by mouth daily., Disp: 90 tablet, Rfl: 1  Current Facility-Administered Medications:    0.9 %  sodium chloride infusion, , Intravenous, Continuous, Sirinity Outland, Wandra Feinstein, NP   famotidine (PEPCID) IVPB 20 mg in NS 100 mL IVPB, 20 mg, Intravenous, Once, Bosten Newstrom, Wandra Feinstein, NP   promethazine (PHENERGAN) 12.5 mg in sodium chloride 0.9 % 50 mL IVPB, 12.5 mg, Intravenous, Q6H PRN, Banessa Mao, Wandra Feinstein, NP  Facility-Administered Medications Ordered in Other Visits:    0.9 %  sodium chloride infusion, , Intravenous, Continuous, Tighe Gitto E, NP, Last Rate: 999 mL/hr at 04/29/21 1105, New Bag at 04/29/21 1105   dexamethasone (DECADRON) 10 MG/ML injection, , , ,    heparin lock flush 100 unit/mL, 500 Units, Intravenous, Once, Earlie Server, MD   sodium chloride flush (NS) 0.9 % injection 10 mL, 10 mL, Intravenous, PRN, Earlie Server, MD, 10 mL at 04/04/19 0903   sodium chloride flush (NS) 0.9 % injection 10 mL, 10 mL, Intravenous, PRN, Earlie Server, MD, 10 mL at 07/26/20 1308  Physical exam:  Vitals:   04/29/21 1104  BP: (!) 148/91  Pulse: 92  Resp: 16  Temp: (!) 97.2 F (36.2 C)  TempSrc: Tympanic  SpO2: 100%   Physical Exam Constitutional:      Appearance: Normal appearance.  HENT:     Head: Normocephalic and atraumatic.  Eyes:     Pupils: Pupils are equal, round, and reactive to light.  Cardiovascular:     Rate and Rhythm: Normal rate and  regular rhythm.     Heart sounds: Normal heart sounds. No murmur heard. Pulmonary:     Effort: Pulmonary effort is normal.     Breath sounds: Normal breath sounds. No wheezing.  Abdominal:     General: Bowel sounds are normal. There is no distension.     Palpations: Abdomen is soft.  Tenderness: There is no abdominal tenderness.  Musculoskeletal:        General: Normal range of motion.     Cervical back: Normal range of motion.  Skin:    General: Skin is warm and dry.     Findings: No rash.  Neurological:     Mental Status: He is alert and oriented to person, place, and time.  Psychiatric:        Judgment: Judgment normal.     CMP Latest Ref Rng & Units 04/29/2021  Glucose 70 - 99 mg/dL 100(H)  BUN 6 - 20 mg/dL 24(H)  Creatinine 0.61 - 1.24 mg/dL 0.93  Sodium 135 - 145 mmol/L 132(L)  Potassium 3.5 - 5.1 mmol/L 4.1  Chloride 98 - 111 mmol/L 98  CO2 22 - 32 mmol/L 24  Calcium 8.9 - 10.3 mg/dL 9.3  Total Protein 6.5 - 8.1 g/dL 7.7  Total Bilirubin 0.3 - 1.2 mg/dL 1.1  Alkaline Phos 38 - 126 U/L 85  AST 15 - 41 U/L 25  ALT 0 - 44 U/L 19   CBC Latest Ref Rng & Units 04/29/2021  WBC 4.0 - 10.5 K/uL 12.2(H)  Hemoglobin 13.0 - 17.0 g/dL 13.7  Hematocrit 39.0 - 52.0 % 38.1(L)  Platelets 150 - 400 K/uL 136(L)    No images are attached to the encounter.  CT CHEST W CONTRAST  Addendum Date: 04/22/2021   ADDENDUM REPORT: 04/22/2021 13:25 ADDENDUM: Critical Value/emergent results were called by telephone at the time of interpretation on 04/22/2021 at 1:24 pm to provider ZHOU YU , who verbally acknowledged these results. Electronically Signed   By: Misty Stanley M.D.   On: 04/22/2021 13:25   Result Date: 04/22/2021 CLINICAL DATA:  Lung cancer restaging. EXAM: CT CHEST WITH CONTRAST TECHNIQUE: Multidetector CT imaging of the chest was performed during intravenous contrast administration. CONTRAST:  49m OMNIPAQUE IOHEXOL 350 MG/ML SOLN COMPARISON:  12/15/2020 FINDINGS:  Cardiovascular: The heart size is normal. No substantial pericardial effusion. Mild atherosclerotic calcification is noted in the wall of the thoracic aorta. Left Port-A-Cath tip is positioned in the mid to distal SVC. Nonocclusive pulmonary embolus is identified in lobar right middle and lower lobe pulmonary arteries. Thrombus has a adherent quality in some regions suggesting that this may be nonacute. Mediastinum/Nodes: No mediastinal lymphadenopathy. There is no hilar lymphadenopathy. Post treatment changes in the parahilar right lung are similar to prior study performed without intravenous contrast material. The esophagus has normal imaging features. There is no axillary lymphadenopathy. Lungs/Pleura: Interval progression of consolidative airspace opacity in the medial right middle lobe with bandlike opacity and bronchiectasis in the parahilar right lung. Volume loss right hemithorax is compatible with upper lobectomy. Nodular areas of airspace disease in the posterior right lung are similar to prior. The patient has a new irregular 11 mm nodule in the anterior right lung base with central cavitation (103/3). Multiple new tiny nodules are seen in the right lung base measuring 3-4 mm (see images 89, 97, 101, and 109. Numerous new pulmonary nodules in the left lung are also evident ranging in size from 3 mm up to about 4 mm maximum size (99/3). Tiny right pleural effusion. Upper Abdomen: Unremarkable. Musculoskeletal: No worrisome lytic or sclerotic osseous abnormality. IMPRESSION: 1. Nonocclusive pulmonary embolus in lobar right middle and lower lobe pulmonary arteries. Thrombus appears adherent in some regions, potentially nonacute. 2. Interval progression of consolidative airspace opacity in the medial right middle lobe with bandlike opacity and bronchiectasis in the parahilar right lung. Imaging  features are compatible with evolving post treatment change although recurrent disease anteriorly is not excluded.  3. Interval development of numerous bilateral pulmonary nodules, measuring up to 11 mm in the right lung base. Imaging features are highly concerning for metastatic disease. 4. Tiny right pleural effusion. 5. Aortic Atherosclerosis (ICD10-I70.0). Electronically Signed: By: Misty Stanley M.D. On: 04/22/2021 12:47   MR PROSTATE W WO CONTRAST  Result Date: 04/09/2021 CLINICAL DATA:  Elevated PSA in a 55 year old male also with history of lung cancer. Most recent PSA of 8.2 EXAM: MR PROSTATE WITHOUT AND WITH CONTRAST TECHNIQUE: Multiplanar multisequence MRI images were obtained of the pelvis centered about the prostate. Pre and post contrast images were obtained. CONTRAST:  84m GADAVIST GADOBUTROL 1 MMOL/ML IV SOLN COMPARISON:  CT chest, abdomen and pelvis of April 2022. FINDINGS: Prostate: Transitional zone: Signs of BPH without visible high-risk lesion. Peripheral zone: Linear and wedge-shaped areas of T2 hypointensity throughout the peripheral zone. Findings of low signal in the peripheral zone at the RIGHT base on T2 slightly more focal, not associated with restricted diffusion. PIRADS category 3 (image 36/13 and image 14/42) Volume:  21 cc PSA density: 0.37 ng/mL Transcapsular spread:  Absent Seminal vesicle involvement: Absent Neurovascular bundle involvement: Absent Pelvic adenopathy: Absent Bone metastasis: Absent Other findings: Sigmoid diverticular disease. IMPRESSION: PIRADS category 3 lesion in the RIGHT base peripheral zone as discussed. Elevated PSA density as discussed above. Findings may warrant close follow-up. Would also correlate with any recent symptoms that would suggest prostatitis. Electronically Signed   By: GZetta BillsM.D.   On: 04/09/2021 15:42     Assessment and plan- Patient is a 55y.o. male who presents to symptom management for non-intractable nausea and vomiting x2 days.  Lung cancer-he is followed by Dr. YTasia Catchingsand has been receiving Keytruda for quite some time now but  unfortunately recent CT scan shows progression of his cancer.  CarboTaxol was added to his KBeryle Flockand he received his first cycle on 04/26/2021.  He is scheduled for his next cycle on 05/17/2021.   Nausea/vomiting-secondary to recent chemotherapy.  He has been taking nausea medicine including Zofran and Compazine without relief.  Lab work from today is fairly stable from previous.  White blood cell count continues to be elevated 12.2 likely secondary to steroids with premeds but has improved from 2 days ago.  No evidence of infection.  We will give IV fluids plus dexamethasone and Phenergan today while in clinic.  Can add Phenergan 25 mg every 6 hours as needed for nausea at home.  GERD-secondary to steroids?  Plan to give Pepcid 20 mg IVPB while in clinic and will offer him prescription for Protonix 20 mg daily.  Prescription sent to pharmacy.  Disposition-RTC as scheduled on 05/17/2021.  Patient to call clinic if symptoms do not improve.   Visit Diagnosis 1. Nausea and vomiting, unspecified vomiting type   2. Squamous cell carcinoma of lung, right ([facility]     Patient expressed understanding and was in agreement with this plan. He also understands that He can call clinic at any time with any questions, concerns, or complaints.   I spent 45 minutes dedicated to the care of this patient (face-to-face and non-face-to-face) on the date of the encounter to include what is described in the assessment and plan.  Thank you for allowing me to participate in the care of this very pleasant patient.    JJacquelin Hawking NP CDoverat AGood Shepherd [facility] At RittenhouseCell - [phone]  Pager- [phone] 04/29/2021 12:03 PM

## 2021-04-29 NOTE — Telephone Encounter (Signed)
Samuel Choi called reportoing that patient had treatment and has been nauseated and vomiting for the past 2 days and that the medicine he has is not working. He also has the hiccups. She is asking something be done for him. Please advise, she said to call the patient back [phone]

## 2021-04-29 NOTE — Telephone Encounter (Signed)
Pt contacted and agreed to symptom management visit today. Appointments scheduled.

## 2021-05-06 ENCOUNTER — Other Ambulatory Visit: Payer: Self-pay

## 2021-05-06 ENCOUNTER — Inpatient Hospital Stay: Payer: 59

## 2021-05-06 DIAGNOSIS — Z5112 Encounter for antineoplastic immunotherapy: Secondary | ICD-10-CM

## 2021-05-06 DIAGNOSIS — C3491 Malignant neoplasm of unspecified part of right bronchus or lung: Secondary | ICD-10-CM

## 2021-05-06 LAB — CBC WITH DIFFERENTIAL/PLATELET
Abs Immature Granulocytes: 0.01 10*3/uL (ref 0.00–0.07)
Basophils Absolute: 0.1 10*3/uL (ref 0.0–0.1)
Basophils Relative: 2 %
Eosinophils Absolute: 0.1 10*3/uL (ref 0.0–0.5)
Eosinophils Relative: 2 %
HCT: 32.5 % — ABNORMAL LOW (ref 39.0–52.0)
Hemoglobin: 11.8 g/dL — ABNORMAL LOW (ref 13.0–17.0)
Immature Granulocytes: 0 %
Lymphocytes Relative: 20 %
Lymphs Abs: 0.7 10*3/uL (ref 0.7–4.0)
MCH: 36.6 pg — ABNORMAL HIGH (ref 26.0–34.0)
MCHC: 36.3 g/dL — ABNORMAL HIGH (ref 30.0–36.0)
MCV: 100.9 fL — ABNORMAL HIGH (ref 80.0–100.0)
Monocytes Absolute: 1.5 10*3/uL — ABNORMAL HIGH (ref 0.1–1.0)
Monocytes Relative: 41 %
Neutro Abs: 1.2 10*3/uL — ABNORMAL LOW (ref 1.7–7.7)
Neutrophils Relative %: 35 %
Platelets: 131 10*3/uL — ABNORMAL LOW (ref 150–400)
RBC: 3.22 MIL/uL — ABNORMAL LOW (ref 4.22–5.81)
RDW: 10.9 % — ABNORMAL LOW (ref 11.5–15.5)
WBC: 3.6 10*3/uL — ABNORMAL LOW (ref 4.0–10.5)
nRBC: 0 % (ref 0.0–0.2)

## 2021-05-06 LAB — COMPREHENSIVE METABOLIC PANEL
ALT: 25 U/L (ref 0–44)
AST: 27 U/L (ref 15–41)
Albumin: 3.6 g/dL (ref 3.5–5.0)
Alkaline Phosphatase: 99 U/L (ref 38–126)
Anion gap: 11 (ref 5–15)
BUN: 13 mg/dL (ref 6–20)
CO2: 23 mmol/L (ref 22–32)
Calcium: 9 mg/dL (ref 8.9–10.3)
Chloride: 91 mmol/L — ABNORMAL LOW (ref 98–111)
Creatinine, Ser: 0.92 mg/dL (ref 0.61–1.24)
GFR, Estimated: >60 mL/min (ref >60)
Glucose, Bld: 108 mg/dL — ABNORMAL HIGH (ref 70–99)
Potassium: 4.3 mmol/L (ref 3.5–5.1)
Sodium: 125 mmol/L — ABNORMAL LOW (ref 135–145)
Total Bilirubin: 0.8 mg/dL (ref 0.3–1.2)
Total Protein: 8.5 g/dL — ABNORMAL HIGH (ref 6.5–8.1)

## 2021-05-09 ENCOUNTER — Telehealth: Payer: Self-pay

## 2021-05-09 NOTE — Telephone Encounter (Signed)
Contacted pt's insurance to start PA for Eliquis 2.5 mg. Supporting documents faxed to [phone] for pharmacy review phone: [phone]  PA reference # [id number]

## 2021-05-10 ENCOUNTER — Encounter: Payer: Self-pay | Admitting: Oncology

## 2021-05-10 NOTE — Telephone Encounter (Signed)
Received fax from [employer] themedication is on the plan's list of covered drugs and prior auth is not required at this time. Response faxed to [employer]

## 2021-05-17 ENCOUNTER — Inpatient Hospital Stay: Payer: 59

## 2021-05-17 ENCOUNTER — Telehealth: Payer: Self-pay

## 2021-05-17 ENCOUNTER — Inpatient Hospital Stay (HOSPITAL_BASED_OUTPATIENT_CLINIC_OR_DEPARTMENT_OTHER): Payer: 59 | Admitting: Oncology

## 2021-05-17 ENCOUNTER — Other Ambulatory Visit: Payer: Self-pay

## 2021-05-17 ENCOUNTER — Inpatient Hospital Stay: Payer: 59 | Attending: Oncology

## 2021-05-17 ENCOUNTER — Other Ambulatory Visit: Payer: Self-pay | Admitting: Oncology

## 2021-05-17 ENCOUNTER — Encounter: Payer: Self-pay | Admitting: Oncology

## 2021-05-17 VITALS — BP 120/68 | HR 99 | Temp 97.8°F | Resp 18 | Wt 130.3 lb

## 2021-05-17 VITALS — BP 89/60 | HR 85 | Temp 96.0°F | Resp 20

## 2021-05-17 DIAGNOSIS — R112 Nausea with vomiting, unspecified: Secondary | ICD-10-CM | POA: Insufficient documentation

## 2021-05-17 DIAGNOSIS — D649 Anemia, unspecified: Secondary | ICD-10-CM | POA: Diagnosis not present

## 2021-05-17 DIAGNOSIS — Z5189 Encounter for other specified aftercare: Secondary | ICD-10-CM | POA: Diagnosis not present

## 2021-05-17 DIAGNOSIS — C3411 Malignant neoplasm of upper lobe, right bronchus or lung: Secondary | ICD-10-CM | POA: Diagnosis present

## 2021-05-17 DIAGNOSIS — Z5111 Encounter for antineoplastic chemotherapy: Secondary | ICD-10-CM

## 2021-05-17 DIAGNOSIS — Z9221 Personal history of antineoplastic chemotherapy: Secondary | ICD-10-CM | POA: Insufficient documentation

## 2021-05-17 DIAGNOSIS — R000 Tachycardia, unspecified: Secondary | ICD-10-CM | POA: Insufficient documentation

## 2021-05-17 DIAGNOSIS — D539 Nutritional anemia, unspecified: Secondary | ICD-10-CM

## 2021-05-17 DIAGNOSIS — Z79899 Other long term (current) drug therapy: Secondary | ICD-10-CM | POA: Insufficient documentation

## 2021-05-17 DIAGNOSIS — D6481 Anemia due to antineoplastic chemotherapy: Secondary | ICD-10-CM | POA: Diagnosis not present

## 2021-05-17 DIAGNOSIS — C3491 Malignant neoplasm of unspecified part of right bronchus or lung: Secondary | ICD-10-CM | POA: Diagnosis not present

## 2021-05-17 DIAGNOSIS — I2699 Other pulmonary embolism without acute cor pulmonale: Secondary | ICD-10-CM | POA: Diagnosis not present

## 2021-05-17 DIAGNOSIS — E876 Hypokalemia: Secondary | ICD-10-CM | POA: Insufficient documentation

## 2021-05-17 DIAGNOSIS — I82412 Acute embolism and thrombosis of left femoral vein: Secondary | ICD-10-CM

## 2021-05-17 DIAGNOSIS — T451X5A Adverse effect of antineoplastic and immunosuppressive drugs, initial encounter: Secondary | ICD-10-CM | POA: Diagnosis not present

## 2021-05-17 DIAGNOSIS — Z86711 Personal history of pulmonary embolism: Secondary | ICD-10-CM | POA: Insufficient documentation

## 2021-05-17 DIAGNOSIS — Z87891 Personal history of nicotine dependence: Secondary | ICD-10-CM | POA: Insufficient documentation

## 2021-05-17 DIAGNOSIS — Z86718 Personal history of other venous thrombosis and embolism: Secondary | ICD-10-CM | POA: Diagnosis not present

## 2021-05-17 DIAGNOSIS — Z5112 Encounter for antineoplastic immunotherapy: Secondary | ICD-10-CM | POA: Diagnosis not present

## 2021-05-17 DIAGNOSIS — Z7901 Long term (current) use of anticoagulants: Secondary | ICD-10-CM | POA: Diagnosis not present

## 2021-05-17 DIAGNOSIS — E43 Unspecified severe protein-calorie malnutrition: Secondary | ICD-10-CM | POA: Insufficient documentation

## 2021-05-17 LAB — COMPREHENSIVE METABOLIC PANEL
ALT: 36 U/L (ref 0–44)
AST: 43 U/L — ABNORMAL HIGH (ref 15–41)
Albumin: 2.6 g/dL — ABNORMAL LOW (ref 3.5–5.0)
Alkaline Phosphatase: 115 U/L (ref 38–126)
Anion gap: 11 (ref 5–15)
BUN: 17 mg/dL (ref 6–20)
CO2: 23 mmol/L (ref 22–32)
Calcium: 8.7 mg/dL — ABNORMAL LOW (ref 8.9–10.3)
Chloride: 96 mmol/L — ABNORMAL LOW (ref 98–111)
Creatinine, Ser: 1.09 mg/dL (ref 0.61–1.24)
GFR, Estimated: >60 mL/min (ref >60)
Glucose, Bld: 126 mg/dL — ABNORMAL HIGH (ref 70–99)
Potassium: 4.1 mmol/L (ref 3.5–5.1)
Sodium: 130 mmol/L — ABNORMAL LOW (ref 135–145)
Total Bilirubin: 0.7 mg/dL (ref 0.3–1.2)
Total Protein: 7.8 g/dL (ref 6.5–8.1)

## 2021-05-17 LAB — CBC WITH DIFFERENTIAL/PLATELET
Abs Immature Granulocytes: 0.11 10*3/uL — ABNORMAL HIGH (ref 0.00–0.07)
Basophils Absolute: 0 10*3/uL (ref 0.0–0.1)
Basophils Relative: 0 %
Eosinophils Absolute: 0.1 10*3/uL (ref 0.0–0.5)
Eosinophils Relative: 1 %
HCT: 19.4 % — ABNORMAL LOW (ref 39.0–52.0)
Hemoglobin: 6.9 g/dL — ABNORMAL LOW (ref 13.0–17.0)
Immature Granulocytes: 1 %
Lymphocytes Relative: 6 %
Lymphs Abs: 0.8 10*3/uL (ref 0.7–4.0)
MCH: 36.1 pg — ABNORMAL HIGH (ref 26.0–34.0)
MCHC: 35.6 g/dL (ref 30.0–36.0)
MCV: 101.6 fL — ABNORMAL HIGH (ref 80.0–100.0)
Monocytes Absolute: 1 10*3/uL (ref 0.1–1.0)
Monocytes Relative: 7 %
Neutro Abs: 12.6 10*3/uL — ABNORMAL HIGH (ref 1.7–7.7)
Neutrophils Relative %: 85 %
Platelets: 144 10*3/uL — ABNORMAL LOW (ref 150–400)
RBC: 1.91 MIL/uL — ABNORMAL LOW (ref 4.22–5.81)
RDW: 14.3 % (ref 11.5–15.5)
WBC: 14.5 10*3/uL — ABNORMAL HIGH (ref 4.0–10.5)
nRBC: 0 % (ref 0.0–0.2)

## 2021-05-17 LAB — PREPARE RBC (CROSSMATCH)

## 2021-05-17 MED ORDER — HEPARIN SOD (PORK) LOCK FLUSH 100 UNIT/ML IV SOLN
INTRAVENOUS | Status: AC
  Filled 2021-05-17: qty 5

## 2021-05-17 MED ORDER — NYSTATIN 100000 UNIT/ML MT SUSP: 5.0000 mL | Freq: Four times a day (QID) | OROMUCOSAL | 1 refills | Status: DC

## 2021-05-17 MED ORDER — ACETAMINOPHEN 325 MG PO TABS
650.0000 mg | ORAL_TABLET | Freq: Once | ORAL | Status: AC
  Administered 2021-05-17: 650 mg via ORAL
  Filled 2021-05-17: qty 2

## 2021-05-17 MED ORDER — DIPHENHYDRAMINE HCL 25 MG PO CAPS
25.0000 mg | ORAL_CAPSULE | Freq: Once | ORAL | Status: AC
  Administered 2021-05-17: 25 mg via ORAL
  Filled 2021-05-17: qty 1

## 2021-05-17 MED ORDER — SODIUM CHLORIDE 0.9% FLUSH
10.0000 mL | INTRAVENOUS | Status: DC | PRN
  Administered 2021-05-17: 10 mL via INTRAVENOUS
  Filled 2021-05-17: qty 10

## 2021-05-17 MED ORDER — HEPARIN SOD (PORK) LOCK FLUSH 100 UNIT/ML IV SOLN
500.0000 [IU] | Freq: Once | INTRAVENOUS | Status: AC
  Administered 2021-05-17: 500 [IU] via INTRAVENOUS
  Filled 2021-05-17: qty 5

## 2021-05-17 MED ORDER — DRONABINOL 5 MG PO CAPS: 5.0000 mg | ORAL_CAPSULE | Freq: Two times a day (BID) | ORAL | 0 refills | Status: DC

## 2021-05-17 MED ORDER — SODIUM CHLORIDE 0.9% IV SOLUTION
250.0000 mL | Freq: Once | INTRAVENOUS | Status: AC
  Administered 2021-05-17: 250 mL via INTRAVENOUS
  Filled 2021-05-17: qty 250

## 2021-05-17 NOTE — Patient Instructions (Signed)
[facility]  Discharge Instructions: Thank you for choosing [facility] to provide your oncology and hematology care.  If you have a lab appointment with the [facility], please go directly to the [facility] and check in at the registration area.  Wear comfortable clothing and clothing appropriate for easy access to any Portacath or PICC line.   We strive to give you quality time with your provider. You may need to reschedule your appointment if you arrive late (15 or more minutes).  Arriving late affects you and other patients whose appointments are after yours.  Also, if you miss three or more appointments without notifying the office, you may be dismissed from the clinic at the provider's discretion.      For prescription refill requests, have your pharmacy contact our office and allow 72 hours for refills to be completed.    Today you received the following : Blood transfusion     Blood Transfusion, Adult, Care After This sheet gives you information about how to care for yourself after your procedure. Your doctor may also give you more specific instructions. If you have problems or questions, contact your doctor. What can I expect after the procedure? After the procedure, it is common to have: Bruising and soreness at the IV site. A headache. Follow these instructions at home: Insertion site care   Follow instructions from your doctor about how to take care of your insertion site. This is where an IV tube was put into your vein. Make sure you: Wash your hands with soap and water before and after you change your bandage (dressing). If you cannot use soap and water, use hand sanitizer. Change your bandage as told by your doctor. Check your insertion site every day for signs of infection. Check for: Redness, swelling, or pain. Bleeding from the site. Warmth. Pus or a bad smell. General instructions Take over-the-counter and  prescription medicines only as told by your doctor. Rest as told by your doctor. Go back to your normal activities as told by your doctor. Keep all follow-up visits as told by your doctor. This is important. Contact a doctor if: You have itching or red, swollen areas of skin (hives). You feel worried or nervous (anxious). You feel weak after doing your normal activities. You have redness, swelling, warmth, or pain around the insertion site. You have blood coming from the insertion site, and the blood does not stop with pressure. You have pus or a bad smell coming from the insertion site. Get help right away if: You have signs of a serious reaction. This may be coming from an allergy or the body's defense system (immune system). Signs include: Trouble breathing or shortness of breath. Swelling of the face or feeling warm (flushed). Fever or chills. Head, chest, or back pain. Dark pee (urine) or blood in the pee. Widespread rash. Fast heartbeat. Feeling dizzy or light-headed. You may receive your blood transfusion in an outpatient setting. If so, you will be told whom to contact to report any reactions. These symptoms may be an emergency. Do not wait to see if the symptoms will go away. Get medical help right away. Call your local emergency services (911 in the [place] Do not drive yourself to the hospital. Summary Bruising and soreness at the IV site are common. Check your insertion site every day for signs of infection. Rest as told by your doctor. Go back to your normal activities as told by your doctor.  Get help right away if you have signs of a serious reaction. This information is not intended to replace advice given to you by your health care provider. Make sure you discuss any questions you have with your health care provider. Document Revised: 10/21/2020 Document Reviewed: 12/19/2018 Elsevier Patient Education  [employer].    To help prevent nausea and vomiting after  your treatment, we encourage you to take your nausea medication as directed.  BELOW ARE SYMPTOMS THAT SHOULD BE REPORTED IMMEDIATELY: *FEVER GREATER THAN 100.4 F (38 C) OR HIGHER *CHILLS OR SWEATING *NAUSEA AND VOMITING THAT IS NOT CONTROLLED WITH YOUR NAUSEA MEDICATION *UNUSUAL SHORTNESS OF BREATH *UNUSUAL BRUISING OR BLEEDING *URINARY PROBLEMS (pain or burning when urinating, or frequent urination) *BOWEL PROBLEMS (unusual diarrhea, constipation, pain near the anus) TENDERNESS IN MOUTH AND THROAT WITH OR WITHOUT PRESENCE OF ULCERS (sore throat, sores in mouth, or a toothache) UNUSUAL RASH, SWELLING OR PAIN  UNUSUAL VAGINAL DISCHARGE OR ITCHING   Items with * indicate a potential emergency and should be followed up as soon as possible or go to the Emergency Department if any problems should occur.  Please show the CHEMOTHERAPY ALERT CARD or IMMUNOTHERAPY ALERT CARD at check-in to the Emergency Department and triage nurse.  Should you have questions after your visit or need to cancel or reschedule your appointment, please contact [facility]  [phone] and follow the prompts.  Office hours are 8:00 a.m. to 4:30 p.m. Monday - Friday. Please note that voicemails left after 4:00 p.m. may not be returned until the following business day.  We are closed weekends and major holidays. You have access to a nurse at all times for urgent questions. Please call the main number to the clinic [phone] and follow the prompts.  For any non-urgent questions, you may also contact your provider using MyChart. We now offer e-Visits for anyone 68 and older to request care online for non-urgent symptoms. For details visit [email or web address].   Also download the MyChart app! Go to the app store, search "MyChart", open the app, select [facility], and log in with your MyChart username and password.  Due to Covid, a mask is required upon entering the hospital/clinic.  If you do not have a mask, one will be given to you upon arrival. For doctor visits, patients may have 1 support person aged 38 or older with them. For treatment visits, patients cannot have anyone with them due to current Covid guidelines and our immunocompromised population.

## 2021-05-17 NOTE — Addendum Note (Signed)
Addended by: Evelina Dun on: 05/17/2021 10:38 AM   Modules accepted: Orders

## 2021-05-17 NOTE — Progress Notes (Signed)
Pt here for follow up. Pt reports increased fatigue and occasional episodes when he has a hard time breathing.

## 2021-05-17 NOTE — Telephone Encounter (Signed)
PA for Dronabinol 5 mg capsules has been submitted via [email or web address]. status of PA pending.   Key: [id number] PA case DI: [id number] Rx # [id number]

## 2021-05-17 NOTE — Telephone Encounter (Signed)
DRONABINOL CAP 5MG  is approved through 11/14/2021

## 2021-05-17 NOTE — Progress Notes (Signed)
Hematology/Oncology Follow up note [phone] [phone]   Patient Care Team: Tracie Harrier, MD as PCP - General (Internal Medicine) Earlie Server, MD as Consulting Physician (Hematology and Oncology)  REASON FOR VISIT:  Follow up for treatment of squamous lung cancer.   HISTORY OF PRESENTING ILLNESS:  # Dec 2019 Stage I squamous lung cancer #s/p Bronchoscopy on 06/25/2018. subcarina EBUS FNA was non diagnostic, hypocellular specimen.  # 08/13/2018 CT guided right upper lobe biopsy pathology showed dense fibrosis and mixed inflammatory cells with prominent polytypic plasma cells component. Focal benign bronchial wall and alveolar spaces. No malignancy was identified.   # 08/13/2018 underwent right thoracotomy and wedge resection of a right upper lobe mass.  Frozen section was consistent with an inflammatory process.  On the second postop day, preliminary pathology reports high-grade malignancy.  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  08/20/2018 Patient was therefore taken back to the OR and underwent take complete lobectomy  pT1b pN0 cM0 stage I squamous cell lung cancer.  Margin is negative.  Recommend observation.    # 12/03/2018 CT chest w contrast showed local recurrence.  12/10/2018 PET showed No evidence of distant metastatic disease # 12/26/2018 s/p bronchoscopy biopsy. Confirmed local recurrence of squamous cell lung cancer.  medi port placed by Dr.Oaks.  # 06/14/2020 CT chest w contrast was reviewed and discussed with patient.  Evidence of progressive lymphangitic spread of tumor throughout the right lung, with contralateral lung nodules with  right pleural effusion  MRI brain is negative for CNS involvement.  CT abdomen with contrast showed no evidence of abdominal metastatic disease PET scan was not approved by insurance-peer to peer appeal with Dr.Vipul Bhanderi   # NGS - KRAS V14L,  TMB 26.6-high, MS stable, PD-L1 5% CDKN2C G48, DICER1c.2117-1G>T,  FLT4 G1131S, NF1 A429f, NF1 L2643*, STK11 N1846fCancer treatment Started concurrent chemoradiation on 01/16/2019 Carboplatin AUC of 2 and Taxol 45 mg/m2 weekly finished in 03/05/2019 04/10/2019, patient started on durvalumab maintenance. -CT showed progression.  Insurance denied PET scan evaluation. 07/16/2020-09/28/2020 4 cycles of carboplatin/paclitaxel/Keytruda  10/15/2020, CT chest abdomen pelvis redemonstrated postoperative and postradiation appearance of the right chest with perihilar consolidation and fibrosis.  Slight interval decrease in size of the pulmonary nodules associated with interlobular septal thickening nearly complete resolution of previously seen left-sided pulmonary nodules.  Minimal irregular residual.  Right pleural effusion improved.  Consistent with treatment response.  No evidence of metastatic disease with the abdomen or pelvis.  None obstructive bilateral nephrolithiasis - partial response to the treatment- 10/19/2020, Keytruda monotherapy maintenance  12/15/2020, CT chest without contrast showed resolution of lung nodules, no evidence of recurrent disease 12/30/2020, patient was admitted due to unprovoked extensive deep vein thrombosis from calf vein to the common femoral vein on the left.  Patient was started on heparin drip.  Patient underwent thrombolysis and is a colectomy on 12/31/2020.  Anticoagulation was switched to Eliquis at discharge.  04/22/2021, CT chest with contrast showed none occlusive pulmonary embolism, Interval progression of consolidation airspace opacity in the medial right middle lobe with bandlike opacity and bronchiectasis in the perihilar right lung,-this is compatible with evolving posttreatment changes although recurrent disease anteriorly is not excluded. Interval development of numerous bilateral pulmonary nodules, measuring up to 11 mm in the right lung base.  Concerning for metastatic disease.  Tiny right pleural effusion.   04/26/2021, resumed on  carboplatin/Taxol/Keytruda.  INTERVAL HISTORY Samuel OTTINGs a [age].o. male who has above history reviewed by me today  presents for reatment of  recurrent  squamous cell lung cancer. Resumed chemotherapy carboplatin Taxol and Keytruda 3 weeks ago.  Patient did not tolerate well with nausea vomiting, hiccups.  Decreased decreased oral intake and loss of weight.  Shortness of breath with exertion recently. Patient was accompanied by his wife today.  No fever, chills, diarrhea, dysuria.  Today no nausea vomiting.      Review of Systems  Constitutional:  Positive for fatigue. Negative for appetite change, chills, fever and unexpected weight change.  HENT:   Negative for hearing loss and voice change.   Eyes:  Negative for eye problems and icterus.  Respiratory:  Positive for shortness of breath. Negative for chest tightness and cough.   Cardiovascular:  Negative for chest pain and leg swelling.  Gastrointestinal:  Positive for nausea and vomiting. Negative for abdominal distention and abdominal pain.  Endocrine: Negative for hot flashes.  Genitourinary:  Negative for difficulty urinating, dysuria and frequency.   Musculoskeletal:  Negative for arthralgias.  Skin:  Negative for itching and rash.  Neurological:  Negative for light-headedness and numbness.  Hematological:  Negative for adenopathy. Does not bruise/bleed easily.  Psychiatric/Behavioral:  Negative for confusion.    MEDICAL HISTORY:  Past Medical History:  Diagnosis Date   Alcohol abuse    usually drinks 2-3 drinks per day   Atherosclerosis 06/2018   Chronic sinusitis    Dehydration 02/07/2019   Emphysema of lung ([place]) 06/2018   patient unaware of this.   Hip fracture ([place]) 05/2018   no surgery   History of kidney stones 05/2018   per xray, bilateral nephrolitiasis   Hypertension    Squamous cell carcinoma of lung, right ([place]) 06/2018    SURGICAL HISTORY: Past Surgical History:  Procedure Laterality Date   BRAIN  SURGERY  10/2017   nasal/sinus endoscopy. mass benign   ELECTROMAGNETIC NAVIGATION BROCHOSCOPY Right 06/25/2018   Procedure: ELECTROMAGNETIC NAVIGATION BRONCHOSCOPY;  Surgeon: [name], MD;  Location: [facility];  Service: Cardiopulmonary;  Laterality: Right;   ENDOBRONCHIAL ULTRASOUND Right 06/25/2018   Procedure: ENDOBRONCHIAL ULTRASOUND;  Surgeon: [name], MD;  Location: [facility];  Service: Cardiopulmonary;  Laterality: Right;   ENDOBRONCHIAL ULTRASOUND Right 12/26/2018   Procedure: ENDOBRONCHIAL ULTRASOUND RIGHT;  Surgeon: [name], MD;  Location: [facility];  Service: Cardiopulmonary;  Laterality: Right;   FLEXIBLE BRONCHOSCOPY N/A 08/20/2018   Procedure: FLEXIBLE BRONCHOSCOPY PREOP;  Surgeon: [name], MD;  Location: [facility];  Service: Thoracic;  Laterality: N/A;   IR CV LINE INJECTION  04/04/2019   NASAL SINUS SURGERY  10/2017   At [facility], frontal sinusotomy, ethmoidectomy, resection anterior cranial fossa neoplasm, turbinate resection   PERIPHERAL VASCULAR THROMBECTOMY Left 12/31/2020   Procedure: PERIPHERAL VASCULAR THROMBECTOMY;  Surgeon: Algernon Huxley, MD;  Location: [facility];  Service: Cardiovascular;  Laterality: Left;   PORTACATH PLACEMENT Left 01/15/2019   Procedure: INSERTION PORT-A-CATH;  Surgeon: [name], MD;  Location: [facility];  Service: General;  Laterality: Left;   THORACOTOMY Right 08/13/2018   Procedure: PREOP BROCHOSCOPY WITH RIGHT THORACOTOMY AND RUL RESECTION;  Surgeon: [name], MD;  Location: [facility];  Service: General;  Laterality: Right;   THORACOTOMY Right 08/20/2018   Procedure: THORACOTOMY MAJOR RIGHT UPPER LOBE LOBECTOMY;  Surgeon: [name], MD;  Location: [facility];  Service: Thoracic;  Laterality: Right;   TOE SURGERY Left    pin in left toe    SOCIAL HISTORY: Social History   Socioeconomic History   Marital status: Married  Spouse name: lisa   Number of children: Not on file   Years of education: Not on file    Highest education level: Not on file  Occupational History   Occupation: welding    Comment: taking time off to resolve issues  Tobacco Use   Smoking status: Former    Packs/day: 0.50    Years: 15.00    Pack years: 7.50    Types: Cigarettes    Quit date: 08/2018    Years since quitting: 2.7   Smokeless tobacco: Never  Vaping Use   Vaping Use: Never used  Substance and Sexual Activity   Alcohol use: Yes    Alcohol/week: 3.0 standard drinks    Types: 3 Cans of beer per week    Comment: usually 2 drinks per week, per patient   Drug use: No   Sexual activity: Not on file  Other Topics Concern   Not on file  Social History Narrative   Not on file   Social Determinants of Health   Financial Resource Strain: Not on file  Food Insecurity: Not on file  Transportation Needs: Not on file  Physical Activity: Not on file  Stress: Not on file  Social Connections: Not on file  Intimate Partner Violence: Not on file    FAMILY HISTORY: Family History  Problem Relation Age of Onset   Breast cancer Mother    Diabetes Mother    Lung cancer Father    Hypertension Father     ALLERGIES:  has No Known Allergies.  MEDICATIONS:  Current Outpatient Medications  Medication Sig Dispense Refill   albuterol (VENTOLIN HFA) 108 (90 Base) MCG/ACT inhaler INHALE 2 PUFFS BY MOUTH EVERY 6 HOURS AS NEEDED FOR WHEEZE OR SHORTNESS OF BREATH 8.5 g 5   apixaban (ELIQUIS) 2.5 MG TABS tablet Take 1 tablet (2.5 mg total) by mouth 2 (two) times daily. (Patient taking differently: Take 5 mg by mouth 2 (two) times daily.) 60 tablet 3   budesonide-formoterol (SYMBICORT) 80-4.5 MCG/ACT inhaler Inhale 2 puffs into the lungs 2 (two) times daily. in the morning and at bedtime. 10.2 each 6   diltiazem (CARDIZEM CD) 120 MG 24 hr capsule Take 120 mg by mouth daily.     dronabinol (MARINOL) 5 MG capsule Take 1 capsule (5 mg total) by mouth 2 (two) times daily before a meal. 60 capsule 0   folic acid (V-R FOLIC ACID)  294 MCG tablet Take 1 tablet (400 mcg total) by mouth daily. 90 tablet 1   hydrALAZINE (APRESOLINE) 100 MG tablet Take 50 mg by mouth 2 (two) times daily.     HYDROcodone-acetaminophen (NORCO/VICODIN) 5-325 MG tablet Take 1 tablet by mouth every 6 (six) hours as needed for severe pain. 10 tablet 0   latanoprost (XALATAN) 0.005 % ophthalmic solution SMARTSIG:In Eye(s)     loratadine (CLARITIN) 10 MG tablet Take 10 mg by mouth daily.     magnesium chloride (SLOW-MAG) 64 MG TBEC SR tablet Take 1 tablet (64 mg total) by mouth 2 (two) times daily. TAKE 1 TABLET (64 MG TOTAL) BY MOUTH DAILY. (Patient taking differently: Take 1 tablet by mouth daily. TAKE 1 TABLET (64 MG TOTAL) BY MOUTH DAILY.) 60 tablet 1   methocarbamol (ROBAXIN) 500 MG tablet Take 1 tablet (500 mg total) by mouth at bedtime. 30 tablet 0   metoprolol succinate (TOPROL-XL) 25 MG 24 hr tablet Take 1 tablet (25 mg total) by mouth daily. 30 tablet 0   olmesartan (BENICAR) 40 MG tablet  Take 40 mg by mouth every other day.      ondansetron (ZOFRAN) 8 MG tablet Take 1 tablet (8 mg total) by mouth every 8 (eight) hours as needed for refractory nausea / vomiting. Start on day 3 after chemo. 90 tablet 3   pantoprazole (PROTONIX) 20 MG tablet Take 1 tablet (20 mg total) by mouth daily. 60 tablet 2   Potassium Chloride ER 20 MEQ TBCR Take 1 tablet by mouth 2 (two) times daily.     promethazine (PHENERGAN) 25 MG tablet Take 1 tablet (25 mg total) by mouth every 6 (six) hours as needed for nausea or vomiting. 30 tablet 0   SPIRIVA HANDIHALER 18 MCG inhalation capsule INHALE 1 CAPSULE VIA HANDIHALER ONCE DAILY AT THE SAME TIME EVERY DAY 30 capsule 0   vitamin B-12 (CYANOCOBALAMIN) 1000 MCG tablet Take 1 tablet (1,000 mcg total) by mouth daily. 90 tablet 1   No current facility-administered medications for this visit.   Facility-Administered Medications Ordered in Other Visits  Medication Dose Route Frequency Provider Last Rate Last Admin   0.9 %   sodium chloride infusion (Manually program via Guardrails IV Fluids)  250 mL Intravenous Once Earlie Server, MD       acetaminophen (TYLENOL) tablet 650 mg  650 mg Oral Once Earlie Server, MD       dexamethasone (DECADRON) 10 MG/ML injection            diphenhydrAMINE (BENADRYL) capsule 25 mg  25 mg Oral Once Earlie Server, MD       heparin lock flush 100 UNIT/ML injection            heparin lock flush 100 unit/mL  500 Units Intravenous Once Earlie Server, MD       heparin lock flush 100 unit/mL  500 Units Intravenous Once Earlie Server, MD       sodium chloride flush (NS) 0.9 % injection 10 mL  10 mL Intravenous PRN Earlie Server, MD   10 mL at 04/04/19 0903   sodium chloride flush (NS) 0.9 % injection 10 mL  10 mL Intravenous PRN Earlie Server, MD   10 mL at 07/26/20 1308   sodium chloride flush (NS) 0.9 % injection 10 mL  10 mL Intravenous PRN Earlie Server, MD   10 mL at 05/17/21 0837     PHYSICAL EXAMINATION: ECOG PERFORMANCE STATUS: 1 - Symptomatic but completely ambulatory Vitals:   05/17/21 0842  BP: 120/68  Pulse: 99  Resp: 18  Temp: 97.8 F (36.6 C)  SpO2: 100%   Filed Weights   05/17/21 0842  Weight: 130 lb 4.8 oz (59.1 kg)    Physical Exam Constitutional:      General: He is not in acute distress. HENT:     Head: Normocephalic and atraumatic.     Mouth/Throat:     Comments: Thrush  Eyes:     General: No scleral icterus. Cardiovascular:     Rate and Rhythm: Normal rate and regular rhythm.     Heart sounds: Normal heart sounds.  Pulmonary:     Effort: Pulmonary effort is normal. No respiratory distress.     Breath sounds: No wheezing.     Comments: Decreased breath sound bilaterally Abdominal:     General: Bowel sounds are normal. There is no distension.     Palpations: Abdomen is soft.  Musculoskeletal:        General: No deformity. Normal range of motion.     Cervical back: Normal range of motion  and neck supple.  Skin:    General: Skin is warm and dry.     Findings: No erythema or rash.   Neurological:     Mental Status: He is alert and oriented to person, place, and time. Mental status is at baseline.     Cranial Nerves: No cranial nerve deficit.     Coordination: Coordination normal.  Psychiatric:        Mood and Affect: Mood normal.     LABORATORY DATA:  I have reviewed the data as listed Lab Results  Component Value Date   WBC 14.5 (H) 05/17/2021   HGB 6.9 (L) 05/17/2021   HCT 19.4 (L) 05/17/2021   MCV 101.6 (H) 05/17/2021   PLT 144 (L) 05/17/2021   Recent Labs    08/31/20 0812 09/07/20 1117 04/29/21 1054 05/06/21 1441 05/17/21 0823  NA 137   < > 132* 125* 130*  K 3.1*   < > 4.1 4.3 4.1  CL 100   < > 98 91* 96*  CO2 25   < > _0 GLUCOSE 107*   < > 100* 108* 126*  BUN 7   < > 24* 13 17  CREATININE 0.79   < > 0.93 0.92 1.09  CALCIUM 8.9   < > 9.3 9.0 8.7*  GFRNONAA >60   < > >60 >60 >60  PROT 7.4   < > 7.7 8.5* 7.8  ALBUMIN 3.4*   < > 3.6 3.6 2.6*  AST 31   < > 25 27 43*  ALT 13   < > 19 25 36  ALKPHOS 101   < > 85 99 115  BILITOT 2.0*   < > 1.1 0.8 0.7  BILIDIR 0.4*  --   --   --   --    < > = values in this interval not displayed.    Iron/TIBC/Ferritin/ %Sat No results found for: IRON, TIBC, FERRITIN, IRONPCTSAT   RADIOGRAPHIC STUDIES: I have personally reviewed the radiological images as listed and agreed with the findings in the report. CT CHEST W CONTRAST  Addendum Date: 04/22/2021   ADDENDUM REPORT: 04/22/2021 13:25 ADDENDUM: Critical Value/emergent results were called by telephone at the time of interpretation on 04/22/2021 at 1:24 pm to provider Tamiya Colello , who verbally acknowledged these results. Electronically Signed   By: Misty Stanley M.D.   On: 04/22/2021 13:25   Result Date: 04/22/2021 CLINICAL DATA:  Lung cancer restaging. EXAM: CT CHEST WITH CONTRAST TECHNIQUE: Multidetector CT imaging of the chest was performed during intravenous contrast administration. CONTRAST:  69m OMNIPAQUE IOHEXOL 350 MG/ML SOLN COMPARISON:   12/15/2020 FINDINGS: Cardiovascular: The heart size is normal. No substantial pericardial effusion. Mild atherosclerotic calcification is noted in the wall of the thoracic aorta. Left Port-A-Cath tip is positioned in the mid to distal SVC. Nonocclusive pulmonary embolus is identified in lobar right middle and lower lobe pulmonary arteries. Thrombus has a adherent quality in some regions suggesting that this may be nonacute. Mediastinum/Nodes: No mediastinal lymphadenopathy. There is no hilar lymphadenopathy. Post treatment changes in the parahilar right lung are similar to prior study performed without intravenous contrast material. The esophagus has normal imaging features. There is no axillary lymphadenopathy. Lungs/Pleura: Interval progression of consolidative airspace opacity in the medial right middle lobe with bandlike opacity and bronchiectasis in the parahilar right lung. Volume loss right hemithorax is compatible with upper lobectomy. Nodular areas of airspace disease in the posterior right lung are similar to prior. The patient  has a new irregular 11 mm nodule in the anterior right lung base with central cavitation (103/3). Multiple new tiny nodules are seen in the right lung base measuring 3-4 mm (see images 89, 97, 101, and 109. Numerous new pulmonary nodules in the left lung are also evident ranging in size from 3 mm up to about 4 mm maximum size (99/3). Tiny right pleural effusion. Upper Abdomen: Unremarkable. Musculoskeletal: No worrisome lytic or sclerotic osseous abnormality. IMPRESSION: 1. Nonocclusive pulmonary embolus in lobar right middle and lower lobe pulmonary arteries. Thrombus appears adherent in some regions, potentially nonacute. 2. Interval progression of consolidative airspace opacity in the medial right middle lobe with bandlike opacity and bronchiectasis in the parahilar right lung. Imaging features are compatible with evolving post treatment change although recurrent disease  anteriorly is not excluded. 3. Interval development of numerous bilateral pulmonary nodules, measuring up to 11 mm in the right lung base. Imaging features are highly concerning for metastatic disease. 4. Tiny right pleural effusion. 5. Aortic Atherosclerosis (ICD10-I70.0). Electronically Signed: By: Misty Stanley M.D. On: 04/22/2021 12:47   MR PROSTATE W WO CONTRAST  Result Date: 04/09/2021 CLINICAL DATA:  Elevated PSA in a 55 year old male also with history of lung cancer. Most recent PSA of 8.2 EXAM: MR PROSTATE WITHOUT AND WITH CONTRAST TECHNIQUE: Multiplanar multisequence MRI images were obtained of the pelvis centered about the prostate. Pre and post contrast images were obtained. CONTRAST:  10m GADAVIST GADOBUTROL 1 MMOL/ML IV SOLN COMPARISON:  CT chest, abdomen and pelvis of April 2022. FINDINGS: Prostate: Transitional zone: Signs of BPH without visible high-risk lesion. Peripheral zone: Linear and wedge-shaped areas of T2 hypointensity throughout the peripheral zone. Findings of low signal in the peripheral zone at the RIGHT base on T2 slightly more focal, not associated with restricted diffusion. PIRADS category 3 (image 36/13 and image 14/42) Volume:  21 cc PSA density: 0.37 ng/mL Transcapsular spread:  Absent Seminal vesicle involvement: Absent Neurovascular bundle involvement: Absent Pelvic adenopathy: Absent Bone metastasis: Absent Other findings: Sigmoid diverticular disease. IMPRESSION: PIRADS category 3 lesion in the RIGHT base peripheral zone as discussed. Elevated PSA density as discussed above. Findings may warrant close follow-up. Would also correlate with any recent symptoms that would suggest prostatitis. Electronically Signed   By: GZetta BillsM.D.   On: 04/09/2021 15:42   UKoreaVenous Img Lower Unilateral Left  Result Date: 03/09/2021 CLINICAL DATA:  history of DVT, follow up EXAM: LEFT LOWER EXTREMITY VENOUS DOPPLER ULTRASOUND TECHNIQUE: Gray-scale sonography with graded  compression, as well as color Doppler and duplex ultrasound were performed to evaluate the lower extremity deep venous systems from the level of the common femoral vein and including the common femoral, femoral, profunda femoral, popliteal and calf veins including the posterior tibial, peroneal and gastrocnemius veins when visible. The superficial great saphenous vein was also interrogated. Spectral Doppler was utilized to evaluate flow at rest and with distal augmentation maneuvers in the common femoral, femoral and popliteal veins. COMPARISON:  June 23 FINDINGS: Contralateral Common Femoral Vein: Respiratory phasicity is normal and symmetric with the symptomatic side. No evidence of thrombus. Normal compressibility. Common Femoral Vein: No evidence of thrombus. Normal compressibility, respiratory phasicity and response to augmentation. Saphenofemoral Junction: No evidence of thrombus. Normal compressibility and flow on color Doppler imaging. Profunda Femoral Vein: No evidence of thrombus. Normal compressibility and flow on color Doppler imaging. Femoral Vein: No evidence of thrombus. Normal compressibility, respiratory phasicity and response to augmentation. Popliteal Vein: Wall adherent nonocclusive DVT. Calf Veins:  No evidence of thrombus. Normal compressibility and flow on color Doppler imaging. Other Findings:  None. IMPRESSION: Markedly improved patency of the left lower extremity deep venous system with small residual wall adherent nonocclusive thrombus limited to the popliteal vein. Electronically Signed   By: Albin Felling M.D.   On: 03/09/2021 13:45      ASSESSMENT & PLAN:  1. Squamous cell carcinoma of lung, right ([place])   2. Encounter for antineoplastic immunotherapy   3. Encounter for antineoplastic chemotherapy   4. Acute deep vein thrombosis (DVT) of femoral vein of left lower extremity (HCC)   5. Chemotherapy induced nausea and vomiting   6. Symptomatic anemia    #Recurrent Squamous cell  carcinoma of lung, stage IV 07/16/2020-09/28/2020 carboplatin Taxol and Keytruda x 1  partial response -Keytruda maintenance.-04/22/2021, progression-resumed on carboplatin/Taxol/Keytruda Patient did not tolerate the treatment. Hold chemotherapy due to symptomatic anemia  #Symptomatic anemia, likely chemotherapy-induced.  Proceed with 2 units of PRBC transfusion today.   #Shortness of breath with exertion.  Combination of COPD/emphysema/lung cancer/symptomatic anemia.. Continue albuterol as needed, Symbicort   #Unprovoked left lower extremity DVT, status post thrombolysis/thrombectomy. Nonocclusive PE noted on recent CT. Continue Eliquis 5 mg twice daily.  #Chronic hypomagnesia.  Continue slow magnesium twice daily.   #Chronic hypokalemia, improved.  Potassium 4.1.  Continue potassium chloride 20 mEq twice daily.   #Weight loss, recommend patient to see nutritionist.  Encourage nutrition supplementation  return of visit:  Patient will follow up in 2 to 3 days, lab NP +/- PRBC +/- IV fluid Follow-up with me in 1 week, lab MD +/- PRBC +/- IV fluid  Earlie Server, MD, PhD Hematology Oncology  05/17/2021

## 2021-05-17 NOTE — Progress Notes (Signed)
MD aware of pt's BP running low during infusion. Pt denies symptoms and states " feels better "

## 2021-05-18 ENCOUNTER — Other Ambulatory Visit: Payer: Self-pay

## 2021-05-18 DIAGNOSIS — C3491 Malignant neoplasm of unspecified part of right bronchus or lung: Secondary | ICD-10-CM

## 2021-05-18 LAB — TYPE AND SCREEN
ABO/RH(D): A POS
Antibody Screen: NEGATIVE
Unit division: 0
Unit division: 0

## 2021-05-18 LAB — BPAM RBC
Blood Product Expiration Date: 202212032359
Blood Product Expiration Date: 202212032359
ISSUE DATE / TIME: 202211081139
ISSUE DATE / TIME: 202211081339
Unit Type and Rh: 6200
Unit Type and Rh: 6200

## 2021-05-19 ENCOUNTER — Other Ambulatory Visit: Payer: Self-pay

## 2021-05-19 ENCOUNTER — Inpatient Hospital Stay: Payer: 59

## 2021-05-19 VITALS — BP 116/58 | HR 64 | Resp 18

## 2021-05-19 DIAGNOSIS — C3491 Malignant neoplasm of unspecified part of right bronchus or lung: Secondary | ICD-10-CM

## 2021-05-19 DIAGNOSIS — Z5112 Encounter for antineoplastic immunotherapy: Secondary | ICD-10-CM | POA: Diagnosis not present

## 2021-05-19 LAB — CBC WITH DIFFERENTIAL/PLATELET
Abs Immature Granulocytes: 0.08 10*3/uL — ABNORMAL HIGH (ref 0.00–0.07)
Basophils Absolute: 0 10*3/uL (ref 0.0–0.1)
Basophils Relative: 0 %
Eosinophils Absolute: 0.1 10*3/uL (ref 0.0–0.5)
Eosinophils Relative: 1 %
HCT: 28.2 % — ABNORMAL LOW (ref 39.0–52.0)
Hemoglobin: 9.8 g/dL — ABNORMAL LOW (ref 13.0–17.0)
Immature Granulocytes: 1 %
Lymphocytes Relative: 6 %
Lymphs Abs: 0.8 10*3/uL (ref 0.7–4.0)
MCH: 33.4 pg (ref 26.0–34.0)
MCHC: 34.8 g/dL (ref 30.0–36.0)
MCV: 96.2 fL (ref 80.0–100.0)
Monocytes Absolute: 1 10*3/uL (ref 0.1–1.0)
Monocytes Relative: 7 %
Neutro Abs: 11.6 10*3/uL — ABNORMAL HIGH (ref 1.7–7.7)
Neutrophils Relative %: 85 %
Platelets: 127 10*3/uL — ABNORMAL LOW (ref 150–400)
RBC: 2.93 MIL/uL — ABNORMAL LOW (ref 4.22–5.81)
RDW: 17.2 % — ABNORMAL HIGH (ref 11.5–15.5)
WBC: 13.7 10*3/uL — ABNORMAL HIGH (ref 4.0–10.5)
nRBC: 0 % (ref 0.0–0.2)

## 2021-05-19 LAB — COMPREHENSIVE METABOLIC PANEL
ALT: 25 U/L (ref 0–44)
AST: 21 U/L (ref 15–41)
Albumin: 2.5 g/dL — ABNORMAL LOW (ref 3.5–5.0)
Alkaline Phosphatase: 100 U/L (ref 38–126)
Anion gap: 9 (ref 5–15)
BUN: 14 mg/dL (ref 6–20)
CO2: 24 mmol/L (ref 22–32)
Calcium: 8.6 mg/dL — ABNORMAL LOW (ref 8.9–10.3)
Chloride: 100 mmol/L (ref 98–111)
Creatinine, Ser: 1.1 mg/dL (ref 0.61–1.24)
GFR, Estimated: >60 mL/min (ref >60)
Glucose, Bld: 117 mg/dL — ABNORMAL HIGH (ref 70–99)
Potassium: 4 mmol/L (ref 3.5–5.1)
Sodium: 133 mmol/L — ABNORMAL LOW (ref 135–145)
Total Bilirubin: 0.7 mg/dL (ref 0.3–1.2)
Total Protein: 7.7 g/dL (ref 6.5–8.1)

## 2021-05-19 LAB — SAMPLE TO BLOOD BANK

## 2021-05-19 MED ORDER — SODIUM CHLORIDE 0.9 % IV SOLN
Freq: Once | INTRAVENOUS | Status: AC
  Filled 2021-05-19: qty 250

## 2021-05-19 MED ORDER — HEPARIN SOD (PORK) LOCK FLUSH 100 UNIT/ML IV SOLN
INTRAVENOUS | Status: AC
  Administered 2021-05-19: 500 [IU]
  Filled 2021-05-19: qty 5

## 2021-05-19 NOTE — Progress Notes (Signed)
Pt reports " I feel much better" Hemoglobin 9.8, MD aware of assessment, labs, and VS. Per [name] per Dr. Tasia Catchings no blood today, pt to receive 1 liter NS over 1 hour. Pt to STOP Hydralazine 100 mg tablets (50 mg twice a day) starting today, Dr. Tasia Catchings will reassess b/p and discuss medication at next appt 05/25/21. Pt to monitor b/p at home and call clinic with any concerns. Pt aware and verbalizes understanding.

## 2021-05-20 ENCOUNTER — Telehealth: Payer: Self-pay

## 2021-05-20 NOTE — Telephone Encounter (Signed)
FMLA forms for Samuel Choi completed and faxed to [employer] @ [phone]. Forms sent to scan. Copy made and will be given to pt at next visit.  Leave ID: [id number]

## 2021-05-25 ENCOUNTER — Inpatient Hospital Stay (HOSPITAL_BASED_OUTPATIENT_CLINIC_OR_DEPARTMENT_OTHER): Payer: 59 | Admitting: Oncology

## 2021-05-25 ENCOUNTER — Other Ambulatory Visit: Payer: Self-pay

## 2021-05-25 ENCOUNTER — Encounter: Payer: Self-pay | Admitting: Oncology

## 2021-05-25 ENCOUNTER — Inpatient Hospital Stay: Payer: 59

## 2021-05-25 VITALS — BP 108/73 | HR 96 | Temp 96.7°F | Wt 132.0 lb

## 2021-05-25 DIAGNOSIS — C3491 Malignant neoplasm of unspecified part of right bronchus or lung: Secondary | ICD-10-CM | POA: Diagnosis not present

## 2021-05-25 DIAGNOSIS — I2699 Other pulmonary embolism without acute cor pulmonale: Secondary | ICD-10-CM

## 2021-05-25 DIAGNOSIS — T451X5A Adverse effect of antineoplastic and immunosuppressive drugs, initial encounter: Secondary | ICD-10-CM

## 2021-05-25 DIAGNOSIS — Z5111 Encounter for antineoplastic chemotherapy: Secondary | ICD-10-CM

## 2021-05-25 DIAGNOSIS — Z5112 Encounter for antineoplastic immunotherapy: Secondary | ICD-10-CM | POA: Diagnosis not present

## 2021-05-25 DIAGNOSIS — E876 Hypokalemia: Secondary | ICD-10-CM

## 2021-05-25 DIAGNOSIS — D6481 Anemia due to antineoplastic chemotherapy: Secondary | ICD-10-CM

## 2021-05-25 DIAGNOSIS — Z95828 Presence of other vascular implants and grafts: Secondary | ICD-10-CM

## 2021-05-25 LAB — CBC WITH DIFFERENTIAL/PLATELET
Abs Immature Granulocytes: 0.05 10*3/uL (ref 0.00–0.07)
Basophils Absolute: 0 10*3/uL (ref 0.0–0.1)
Basophils Relative: 1 %
Eosinophils Absolute: 0.1 10*3/uL (ref 0.0–0.5)
Eosinophils Relative: 1 %
HCT: 28.1 % — ABNORMAL LOW (ref 39.0–52.0)
Hemoglobin: 9.3 g/dL — ABNORMAL LOW (ref 13.0–17.0)
Immature Granulocytes: 1 %
Lymphocytes Relative: 13 %
Lymphs Abs: 1.2 10*3/uL (ref 0.7–4.0)
MCH: 33.1 pg (ref 26.0–34.0)
MCHC: 33.1 g/dL (ref 30.0–36.0)
MCV: 100 fL (ref 80.0–100.0)
Monocytes Absolute: 1 10*3/uL (ref 0.1–1.0)
Monocytes Relative: 11 %
Neutro Abs: 6.4 10*3/uL (ref 1.7–7.7)
Neutrophils Relative %: 73 %
Platelets: 143 10*3/uL — ABNORMAL LOW (ref 150–400)
RBC: 2.81 MIL/uL — ABNORMAL LOW (ref 4.22–5.81)
RDW: 16.2 % — ABNORMAL HIGH (ref 11.5–15.5)
WBC: 8.8 10*3/uL (ref 4.0–10.5)
nRBC: 0 % (ref 0.0–0.2)

## 2021-05-25 LAB — COMPREHENSIVE METABOLIC PANEL
ALT: 15 U/L (ref 0–44)
AST: 17 U/L (ref 15–41)
Albumin: 2.7 g/dL — ABNORMAL LOW (ref 3.5–5.0)
Alkaline Phosphatase: 90 U/L (ref 38–126)
Anion gap: 12 (ref 5–15)
BUN: 14 mg/dL (ref 6–20)
CO2: 23 mmol/L (ref 22–32)
Calcium: 8.7 mg/dL — ABNORMAL LOW (ref 8.9–10.3)
Chloride: 96 mmol/L — ABNORMAL LOW (ref 98–111)
Creatinine, Ser: 1 mg/dL (ref 0.61–1.24)
GFR, Estimated: >60 mL/min (ref >60)
Glucose, Bld: 111 mg/dL — ABNORMAL HIGH (ref 70–99)
Potassium: 4 mmol/L (ref 3.5–5.1)
Sodium: 131 mmol/L — ABNORMAL LOW (ref 135–145)
Total Bilirubin: 0.2 mg/dL — ABNORMAL LOW (ref 0.3–1.2)
Total Protein: 8.1 g/dL (ref 6.5–8.1)

## 2021-05-25 LAB — SAMPLE TO BLOOD BANK

## 2021-05-25 MED ORDER — HEPARIN SOD (PORK) LOCK FLUSH 100 UNIT/ML IV SOLN
500.0000 [IU] | Freq: Once | INTRAVENOUS | Status: DC
  Administered 2021-05-25: 500 [IU] via INTRAVENOUS
  Filled 2021-05-25: qty 5

## 2021-05-25 MED ORDER — SODIUM CHLORIDE 0.9% FLUSH
10.0000 mL | Freq: Once | INTRAVENOUS | Status: AC
  Administered 2021-05-25: 10 mL via INTRAVENOUS
  Filled 2021-05-25: qty 10

## 2021-05-25 NOTE — Progress Notes (Signed)
Nutrition  RD to see patient during infusion today but infusion cancelled.  RD will follow-up with patient at next treatment.  Adalis Gatti B. Zenia Resides, [place], [place] Registered Dietitian [phone] (mobile)

## 2021-05-25 NOTE — Telephone Encounter (Signed)
FMLA forms updated with new intermittent dates from  11/02/14/21- 11/13/21. Forms re-faxed to [employer].

## 2021-05-25 NOTE — Progress Notes (Signed)
Hematology/Oncology Follow up note [phone] [phone]   Patient Care Team: Tracie Harrier, MD as PCP - General (Internal Medicine) Earlie Server, MD as Consulting Physician (Hematology and Oncology)  REASON FOR VISIT:  Follow up for treatment of squamous lung cancer.   HISTORY OF PRESENTING ILLNESS:  # Dec 2019 Stage I squamous lung cancer #s/p Bronchoscopy on 06/25/2018. subcarina EBUS FNA was non diagnostic, hypocellular specimen.  # 08/13/2018 CT guided right upper lobe biopsy pathology showed dense fibrosis and mixed inflammatory cells with prominent polytypic plasma cells component. Focal benign bronchial wall and alveolar spaces. No malignancy was identified.   # 08/13/2018 underwent right thoracotomy and wedge resection of a right upper lobe mass.  Frozen section was consistent with an inflammatory process.  On the second postop day, preliminary pathology reports high-grade malignancy.  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  08/20/2018 Patient was therefore taken back to the OR and underwent take complete lobectomy  pT1b pN0 cM0 stage I squamous cell lung cancer.  Margin is negative.  Recommend observation.    # 12/03/2018 CT chest w contrast showed local recurrence.  12/10/2018 PET showed No evidence of distant metastatic disease # 12/26/2018 s/p bronchoscopy biopsy. Confirmed local recurrence of squamous cell lung cancer.  medi port placed by Dr.Oaks.  # 06/14/2020 CT chest w contrast was reviewed and discussed with patient.  Evidence of progressive lymphangitic spread of tumor throughout the right lung, with contralateral lung nodules with  right pleural effusion  MRI brain is negative for CNS involvement.  CT abdomen with contrast showed no evidence of abdominal metastatic disease PET scan was not approved by insurance-peer to peer appeal with Dr.Vipul Bhanderi   # NGS - KRAS V14L,  TMB 26.6-high, MS stable, PD-L1 5% CDKN2C G48, DICER1c.2117-1G>T,  FLT4 G1131S, NF1 A429f, NF1 L2643*, STK11 N1846fCancer treatment Started concurrent chemoradiation on 01/16/2019 Carboplatin AUC of 2 and Taxol 45 mg/m2 weekly finished in 03/05/2019 04/10/2019, patient started on durvalumab maintenance. -CT showed progression.  Insurance denied PET scan evaluation. 07/16/2020-09/28/2020 4 cycles of carboplatin/paclitaxel/Keytruda  10/15/2020, CT chest abdomen pelvis redemonstrated postoperative and postradiation appearance of the right chest with perihilar consolidation and fibrosis.  Slight interval decrease in size of the pulmonary nodules associated with interlobular septal thickening nearly complete resolution of previously seen left-sided pulmonary nodules.  Minimal irregular residual.  Right pleural effusion improved.  Consistent with treatment response.  No evidence of metastatic disease with the abdomen or pelvis.  None obstructive bilateral nephrolithiasis - partial response to the treatment- 10/19/2020, Keytruda monotherapy maintenance  12/15/2020, CT chest without contrast showed resolution of lung nodules, no evidence of recurrent disease 12/30/2020, patient was admitted due to unprovoked extensive deep vein thrombosis from calf vein to the common femoral vein on the left.  Patient was started on heparin drip.  Patient underwent thrombolysis and is a colectomy on 12/31/2020.  Anticoagulation was switched to Eliquis at discharge.  04/22/2021, CT chest with contrast showed none occlusive pulmonary embolism, Interval progression of consolidation airspace opacity in the medial right middle lobe with bandlike opacity and bronchiectasis in the perihilar right lung,-this is compatible with evolving posttreatment changes although recurrent disease anteriorly is not excluded. Interval development of numerous bilateral pulmonary nodules, measuring up to 11 mm in the right lung base.  Concerning for metastatic disease.  Tiny right pleural effusion.   04/26/2021, resumed on  carboplatin/Taxol/Keytruda.  INTERVAL HISTORY Samuel OTTINGs a [age] who has above history reviewed by me today  presents for reatment of  recurrent  squamous cell carcinoma. Patient did not tolerate his chemotherapy of carboplatin/Taxol/Keytruda on 04/26/2021. Patient has developed severe anemia status post PRBC transfusion. Today he was accompanied by his wife.  He reports feeling better now still a little bit weak. Denies any more shortness of breath then his baseline.  No nausea vomiting diarrhea.    Review of Systems  Constitutional:  Positive for fatigue. Negative for appetite change, chills, fever and unexpected weight change.  HENT:   Negative for hearing loss and voice change.   Eyes:  Negative for eye problems and icterus.  Respiratory:  Positive for shortness of breath. Negative for chest tightness and cough.   Cardiovascular:  Negative for chest pain and leg swelling.  Gastrointestinal:  Negative for abdominal distention, abdominal pain, nausea and vomiting.  Endocrine: Negative for hot flashes.  Genitourinary:  Negative for difficulty urinating, dysuria and frequency.   Musculoskeletal:  Negative for arthralgias.  Skin:  Negative for itching and rash.  Neurological:  Negative for light-headedness and numbness.  Hematological:  Negative for adenopathy. Does not bruise/bleed easily.  Psychiatric/Behavioral:  Negative for confusion.    MEDICAL HISTORY:  Past Medical History:  Diagnosis Date   Alcohol abuse    usually drinks 2-3 drinks per day   Atherosclerosis 06/2018   Chronic sinusitis    Dehydration 02/07/2019   Emphysema of lung ([place]) 06/2018   patient unaware of this.   Hip fracture ([place]) 05/2018   no surgery   History of kidney stones 05/2018   per xray, bilateral nephrolitiasis   Hypertension    Squamous cell carcinoma of lung, right ([place]) 06/2018    SURGICAL HISTORY: Past Surgical History:  Procedure Laterality Date   BRAIN SURGERY  10/2017    nasal/sinus endoscopy. mass benign   ELECTROMAGNETIC NAVIGATION BROCHOSCOPY Right 06/25/2018   Procedure: ELECTROMAGNETIC NAVIGATION BRONCHOSCOPY;  Surgeon: [name], MD;  Location: [facility];  Service: Cardiopulmonary;  Laterality: Right;   ENDOBRONCHIAL ULTRASOUND Right 06/25/2018   Procedure: ENDOBRONCHIAL ULTRASOUND;  Surgeon: [name], MD;  Location: [facility];  Service: Cardiopulmonary;  Laterality: Right;   ENDOBRONCHIAL ULTRASOUND Right 12/26/2018   Procedure: ENDOBRONCHIAL ULTRASOUND RIGHT;  Surgeon: [name], MD;  Location: [facility];  Service: Cardiopulmonary;  Laterality: Right;   FLEXIBLE BRONCHOSCOPY N/A 08/20/2018   Procedure: FLEXIBLE BRONCHOSCOPY PREOP;  Surgeon: [name], MD;  Location: [facility];  Service: Thoracic;  Laterality: N/A;   IR CV LINE INJECTION  04/04/2019   NASAL SINUS SURGERY  10/2017   [facility], frontal sinusotomy, ethmoidectomy, resection anterior cranial fossa neoplasm, turbinate resection   PERIPHERAL VASCULAR THROMBECTOMY Left 12/31/2020   Procedure: PERIPHERAL VASCULAR THROMBECTOMY;  Surgeon: Algernon Huxley, MD;  Location: [facility];  Service: Cardiovascular;  Laterality: Left;   PORTACATH PLACEMENT Left 01/15/2019   Procedure: INSERTION PORT-A-CATH;  Surgeon: [name], MD;  Location: [facility];  Service: General;  Laterality: Left;   THORACOTOMY Right 08/13/2018   Procedure: PREOP BROCHOSCOPY WITH RIGHT THORACOTOMY AND RUL RESECTION;  Surgeon: [name], MD;  Location: [facility];  Service: General;  Laterality: Right;   THORACOTOMY Right 08/20/2018   Procedure: THORACOTOMY MAJOR RIGHT UPPER LOBE LOBECTOMY;  Surgeon: [name], MD;  Location: [facility];  Service: Thoracic;  Laterality: Right;   TOE SURGERY Left    pin in left toe    SOCIAL HISTORY: Social History   Socioeconomic History   Marital status: Married    Spouse name: lisa   Number  of children: Not on file   Years of education: Not on file   Highest education level:  Not on file  Occupational History   Occupation: welding    Comment: taking time off to resolve issues  Tobacco Use   Smoking status: Former    Packs/day: 0.50    Years: 15.00    Pack years: 7.50    Types: Cigarettes    Quit date: 08/2018    Years since quitting: 2.7   Smokeless tobacco: Never  Vaping Use   Vaping Use: Never used  Substance and Sexual Activity   Alcohol use: Yes    Alcohol/week: 3.0 standard drinks    Types: 3 Cans of beer per week    Comment: usually 2 drinks per week, per patient   Drug use: No   Sexual activity: Not on file  Other Topics Concern   Not on file  Social History Narrative   Not on file   Social Determinants of Health   Financial Resource Strain: Not on file  Food Insecurity: Not on file  Transportation Needs: Not on file  Physical Activity: Not on file  Stress: Not on file  Social Connections: Not on file  Intimate Partner Violence: Not on file    FAMILY HISTORY: Family History  Problem Relation Age of Onset   Breast cancer Mother    Diabetes Mother    Lung cancer Father    Hypertension Father     ALLERGIES:  has No Known Allergies.  MEDICATIONS:  Current Outpatient Medications  Medication Sig Dispense Refill   albuterol (VENTOLIN HFA) 108 (90 Base) MCG/ACT inhaler INHALE 2 PUFFS BY MOUTH EVERY 6 HOURS AS NEEDED FOR WHEEZE OR SHORTNESS OF BREATH 8.5 g 5   apixaban (ELIQUIS) 2.5 MG TABS tablet Take 1 tablet (2.5 mg total) by mouth 2 (two) times daily. (Patient taking differently: Take 5 mg by mouth 2 (two) times daily.) 60 tablet 3   budesonide-formoterol (SYMBICORT) 80-4.5 MCG/ACT inhaler Inhale 2 puffs into the lungs 2 (two) times daily. in the morning and at bedtime. 10.2 each 6   diltiazem (CARDIZEM CD) 120 MG 24 hr capsule Take 120 mg by mouth daily.     dronabinol (MARINOL) 5 MG capsule Take 1 capsule (5 mg total) by mouth 2 (two) times daily before a meal. 60 capsule 0   folic acid (V-R FOLIC ACID) 638 MCG tablet Take 1  tablet (400 mcg total) by mouth daily. 90 tablet 1   HYDROcodone-acetaminophen (NORCO/VICODIN) 5-325 MG tablet Take 1 tablet by mouth every 6 (six) hours as needed for severe pain. 10 tablet 0   latanoprost (XALATAN) 0.005 % ophthalmic solution SMARTSIG:In Eye(s)     loratadine (CLARITIN) 10 MG tablet Take 10 mg by mouth daily.     magnesium chloride (SLOW-MAG) 64 MG TBEC SR tablet Take 1 tablet (64 mg total) by mouth 2 (two) times daily. TAKE 1 TABLET (64 MG TOTAL) BY MOUTH DAILY. (Patient taking differently: Take 1 tablet by mouth daily. TAKE 1 TABLET (64 MG TOTAL) BY MOUTH DAILY.) 60 tablet 1   methocarbamol (ROBAXIN) 500 MG tablet Take 1 tablet (500 mg total) by mouth at bedtime. 30 tablet 0   metoprolol succinate (TOPROL-XL) 25 MG 24 hr tablet Take 1 tablet (25 mg total) by mouth daily. 30 tablet 0   nystatin (MYCOSTATIN) 100000 UNIT/ML suspension Take 5 mLs (500,000 Units total) by mouth 4 (four) times daily. Swish and swallow. 473 mL 1   olmesartan (BENICAR) 40 MG  tablet Take 40 mg by mouth every other day.      ondansetron (ZOFRAN) 8 MG tablet Take 1 tablet (8 mg total) by mouth every 8 (eight) hours as needed for refractory nausea / vomiting. Start on day 3 after chemo. 90 tablet 3   pantoprazole (PROTONIX) 20 MG tablet Take 1 tablet (20 mg total) by mouth daily. 60 tablet 2   Potassium Chloride ER 20 MEQ TBCR Take 1 tablet by mouth 2 (two) times daily.     promethazine (PHENERGAN) 25 MG tablet Take 1 tablet (25 mg total) by mouth every 6 (six) hours as needed for nausea or vomiting. 30 tablet 0   SPIRIVA HANDIHALER 18 MCG inhalation capsule INHALE 1 CAPSULE VIA HANDIHALER ONCE DAILY AT THE SAME TIME EVERY DAY 30 capsule 0   vitamin B-12 (CYANOCOBALAMIN) 1000 MCG tablet Take 1 tablet (1,000 mcg total) by mouth daily. 90 tablet 1   hydrALAZINE (APRESOLINE) 100 MG tablet Take 50 mg by mouth 2 (two) times daily.     No current facility-administered medications for this visit.    Facility-Administered Medications Ordered in Other Visits  Medication Dose Route Frequency Provider Last Rate Last Admin   dexamethasone (DECADRON) 10 MG/ML injection            heparin lock flush 100 unit/mL  500 Units Intravenous Once Earlie Server, MD       sodium chloride flush (NS) 0.9 % injection 10 mL  10 mL Intravenous PRN Earlie Server, MD   10 mL at 04/04/19 0903   sodium chloride flush (NS) 0.9 % injection 10 mL  10 mL Intravenous PRN Earlie Server, MD   10 mL at 07/26/20 1308     PHYSICAL EXAMINATION: ECOG PERFORMANCE STATUS: 1 - Symptomatic but completely ambulatory Vitals:   05/25/21 0848  BP: 108/73  Pulse: 96  Temp: (!) 96.7 F (35.9 C)   Filed Weights   05/25/21 0848  Weight: 132 lb (59.9 kg)    Physical Exam Constitutional:      General: He is not in acute distress. HENT:     Head: Normocephalic and atraumatic.     Mouth/Throat:     Comments: Thrush  Eyes:     General: No scleral icterus. Cardiovascular:     Rate and Rhythm: Normal rate and regular rhythm.     Heart sounds: Normal heart sounds.  Pulmonary:     Effort: Pulmonary effort is normal. No respiratory distress.     Breath sounds: No wheezing.     Comments: Decreased breath sound bilaterally Abdominal:     General: Bowel sounds are normal. There is no distension.     Palpations: Abdomen is soft.  Musculoskeletal:        General: No deformity. Normal range of motion.     Cervical back: Normal range of motion and neck supple.  Skin:    General: Skin is warm and dry.     Findings: No erythema or rash.  Neurological:     Mental Status: He is alert and oriented to person, place, and time. Mental status is at baseline.     Cranial Nerves: No cranial nerve deficit.     Coordination: Coordination normal.  Psychiatric:        Mood and Affect: Mood normal.     LABORATORY DATA:  I have reviewed the data as listed Lab Results  Component Value Date   WBC 8.8 05/25/2021   HGB 9.3 (L) 05/25/2021   HCT  28.1 (L)  05/25/2021   MCV 100.0 05/25/2021   PLT 143 (L) 05/25/2021   Recent Labs    08/31/20 0812 09/07/20 1117 05/17/21 0823 05/19/21 0823 05/25/21 0831  NA 137   < > 130* 133* 131*  K 3.1*   < > 4.1 4.0 4.0  CL 100   < > 96* 100 96*  CO2 25   < > _0 GLUCOSE 107*   < > 126* 117* 111*  BUN 7   < > _1 CREATININE 0.79   < > 1.09 1.10 1.00  CALCIUM 8.9   < > 8.7* 8.6* 8.7*  GFRNONAA >60   < > >60 >60 >60  PROT 7.4   < > 7.8 7.7 8.1  ALBUMIN 3.4*   < > 2.6* 2.5* 2.7*  AST 31   < > 43* 21 17  ALT 13   < > 36 25 15  ALKPHOS 101   < > 115 100 90  BILITOT 2.0*   < > 0.7 0.7 0.2*  BILIDIR 0.4*  --   --   --   --    < > = values in this interval not displayed.    Iron/TIBC/Ferritin/ %Sat No results found for: IRON, TIBC, FERRITIN, IRONPCTSAT   RADIOGRAPHIC STUDIES: I have personally reviewed the radiological images as listed and agreed with the findings in the report. CT CHEST W CONTRAST  Addendum Date: 04/22/2021   ADDENDUM REPORT: 04/22/2021 13:25 ADDENDUM: Critical Value/emergent results were called by telephone at the time of interpretation on 04/22/2021 at 1:24 pm to provider Mukund Weinreb , who verbally acknowledged these results. Electronically Signed   By: Misty Stanley M.D.   On: 04/22/2021 13:25   Result Date: 04/22/2021 CLINICAL DATA:  Lung cancer restaging. EXAM: CT CHEST WITH CONTRAST TECHNIQUE: Multidetector CT imaging of the chest was performed during intravenous contrast administration. CONTRAST:  63m OMNIPAQUE IOHEXOL 350 MG/ML SOLN COMPARISON:  12/15/2020 FINDINGS: Cardiovascular: The heart size is normal. No substantial pericardial effusion. Mild atherosclerotic calcification is noted in the wall of the thoracic aorta. Left Port-A-Cath tip is positioned in the mid to distal SVC. Nonocclusive pulmonary embolus is identified in lobar right middle and lower lobe pulmonary arteries. Thrombus has a adherent quality in some regions suggesting that this may be  nonacute. Mediastinum/Nodes: No mediastinal lymphadenopathy. There is no hilar lymphadenopathy. Post treatment changes in the parahilar right lung are similar to prior study performed without intravenous contrast material. The esophagus has normal imaging features. There is no axillary lymphadenopathy. Lungs/Pleura: Interval progression of consolidative airspace opacity in the medial right middle lobe with bandlike opacity and bronchiectasis in the parahilar right lung. Volume loss right hemithorax is compatible with upper lobectomy. Nodular areas of airspace disease in the posterior right lung are similar to prior. The patient has a new irregular 11 mm nodule in the anterior right lung base with central cavitation (103/3). Multiple new tiny nodules are seen in the right lung base measuring 3-4 mm (see images 89, 97, 101, and 109. Numerous new pulmonary nodules in the left lung are also evident ranging in size from 3 mm up to about 4 mm maximum size (99/3). Tiny right pleural effusion. Upper Abdomen: Unremarkable. Musculoskeletal: No worrisome lytic or sclerotic osseous abnormality. IMPRESSION: 1. Nonocclusive pulmonary embolus in lobar right middle and lower lobe pulmonary arteries. Thrombus appears adherent in some regions, potentially nonacute. 2. Interval progression of consolidative airspace opacity in the medial right middle lobe with bandlike opacity  and bronchiectasis in the parahilar right lung. Imaging features are compatible with evolving post treatment change although recurrent disease anteriorly is not excluded. 3. Interval development of numerous bilateral pulmonary nodules, measuring up to 11 mm in the right lung base. Imaging features are highly concerning for metastatic disease. 4. Tiny right pleural effusion. 5. Aortic Atherosclerosis (ICD10-I70.0). Electronically Signed: By: Misty Stanley M.D. On: 04/22/2021 12:47   MR PROSTATE W WO CONTRAST  Result Date: 04/09/2021 CLINICAL DATA:  Elevated  PSA in a 55 year old male also with history of lung cancer. Most recent PSA of 8.2 EXAM: MR PROSTATE WITHOUT AND WITH CONTRAST TECHNIQUE: Multiplanar multisequence MRI images were obtained of the pelvis centered about the prostate. Pre and post contrast images were obtained. CONTRAST:  55m GADAVIST GADOBUTROL 1 MMOL/ML IV SOLN COMPARISON:  CT chest, abdomen and pelvis of April 2022. FINDINGS: Prostate: Transitional zone: Signs of BPH without visible high-risk lesion. Peripheral zone: Linear and wedge-shaped areas of T2 hypointensity throughout the peripheral zone. Findings of low signal in the peripheral zone at the RIGHT base on T2 slightly more focal, not associated with restricted diffusion. PIRADS category 3 (image 36/13 and image 14/42) Volume:  21 cc PSA density: 0.37 ng/mL Transcapsular spread:  Absent Seminal vesicle involvement: Absent Neurovascular bundle involvement: Absent Pelvic adenopathy: Absent Bone metastasis: Absent Other findings: Sigmoid diverticular disease. IMPRESSION: PIRADS category 3 lesion in the RIGHT base peripheral zone as discussed. Elevated PSA density as discussed above. Findings may warrant close follow-up. Would also correlate with any recent symptoms that would suggest prostatitis. Electronically Signed   By: GZetta BillsM.D.   On: 04/09/2021 15:42   [place]ous Img Lower Unilateral Left  Result Date: 03/09/2021 CLINICAL DATA:  history of DVT, follow up EXAM: LEFT LOWER EXTREMITY VENOUS DOPPLER ULTRASOUND TECHNIQUE: Gray-scale sonography with graded compression, as well as color Doppler and duplex ultrasound were performed to evaluate the lower extremity deep venous systems from the level of the common femoral vein and including the common femoral, femoral, profunda femoral, popliteal and calf veins including the posterior tibial, peroneal and gastrocnemius veins when visible. The superficial great saphenous vein was also interrogated. Spectral Doppler was utilized to evaluate  flow at rest and with distal augmentation maneuvers in the common femoral, femoral and popliteal veins. COMPARISON:  June 23 FINDINGS: Contralateral Common Femoral Vein: Respiratory phasicity is normal and symmetric with the symptomatic side. No evidence of thrombus. Normal compressibility. Common Femoral Vein: No evidence of thrombus. Normal compressibility, respiratory phasicity and response to augmentation. Saphenofemoral Junction: No evidence of thrombus. Normal compressibility and flow on color Doppler imaging. Profunda Femoral Vein: No evidence of thrombus. Normal compressibility and flow on color Doppler imaging. Femoral Vein: No evidence of thrombus. Normal compressibility, respiratory phasicity and response to augmentation. Popliteal Vein: Wall adherent nonocclusive DVT. Calf Veins: No evidence of thrombus. Normal compressibility and flow on color Doppler imaging. Other Findings:  None. IMPRESSION: Markedly improved patency of the left lower extremity deep venous system with small residual wall adherent nonocclusive thrombus limited to the popliteal vein. Electronically Signed   By: YAlbin FellingM.D.   On: 03/09/2021 13:45      ASSESSMENT & PLAN:  1. [place]   2. Encounter for antineoplastic chemotherapy   3. Encounter for antineoplastic immunotherapy   4. Other acute pulmonary embolism without acute cor pulmonale ([place]   5. Hypokalemia   6. Hypomagnesemia   7. Antineoplastic chemotherapy induced anemia    #Recurrent  Squamous cell carcinoma of lung, stage IV 07/16/2020-09/28/2020 carboplatin Taxol and Keytruda x 1  partial response -Keytruda maintenance.-04/22/2021, progression-resumed on carboplatin/Taxol/Keytruda Patient is clinically doing better. Labs are reviewed and discussed with patient. Proceed with carboplatin AUC 5, Keytruda.  Hold Taxol.  #Chemotherapy-induced anemia.  Hemoglobin has improved since previous blood transfusion.   Monitor.  #Shortness of breath with exertion.  Combination of COPD/emphysema/lung cancer/symptomatic anemia.. Continue albuterol as needed, Symbicort   #Unprovoked left lower extremity DVT, status post thrombolysis/thrombectomy. Nonocclusive PE noted on recent CT. Continue Eliquis 5 mg twice daily.  #Chronic hypomagnesia.  Continue slow magnesium twice daily.   #Chronic hypokalemia, improved.  Potassium 4.0.  Continue potassium chloride 20 mEq twice daily.   #Weight loss, malnutrition, patient will see nutritionist  Encourage nutrition supplementation  return of visit:  Lab MD multiple blood in 2 weeks Follow-up with me in 3 weeks lab MD carboplatin, Taxol, Keytruda.    Earlie Server, MD, PhD  05/25/2021

## 2021-05-26 ENCOUNTER — Inpatient Hospital Stay: Payer: 59

## 2021-05-26 ENCOUNTER — Ambulatory Visit: Payer: Medicare Other

## 2021-05-26 VITALS — BP 127/71 | HR 80 | Temp 96.0°F | Resp 19

## 2021-05-26 DIAGNOSIS — C3491 Malignant neoplasm of unspecified part of right bronchus or lung: Secondary | ICD-10-CM

## 2021-05-26 DIAGNOSIS — Z5112 Encounter for antineoplastic immunotherapy: Secondary | ICD-10-CM | POA: Diagnosis not present

## 2021-05-26 MED ORDER — HEPARIN SOD (PORK) LOCK FLUSH 100 UNIT/ML IV SOLN
500.0000 [IU] | Freq: Once | INTRAVENOUS | Status: AC | PRN
  Filled 2021-05-26: qty 5

## 2021-05-26 MED ORDER — SODIUM CHLORIDE 0.9 % IV SOLN
480.0000 mg | Freq: Once | INTRAVENOUS | Status: AC
  Administered 2021-05-26: 12:00:00 480 mg via INTRAVENOUS
  Filled 2021-05-26: qty 48

## 2021-05-26 MED ORDER — SODIUM CHLORIDE 0.9 % IV SOLN
10.0000 mg | Freq: Once | INTRAVENOUS | Status: AC
  Administered 2021-05-26: 11:00:00 10 mg via INTRAVENOUS
  Filled 2021-05-26: qty 10

## 2021-05-26 MED ORDER — FAMOTIDINE 20 MG IN NS 100 ML IVPB: 20.0000 mg | Freq: Once | INTRAVENOUS | Status: DC

## 2021-05-26 MED ORDER — PALONOSETRON HCL INJECTION 0.25 MG/5ML
0.2500 mg | Freq: Once | INTRAVENOUS | Status: AC
  Administered 2021-05-26: 10:00:00 0.25 mg via INTRAVENOUS
  Filled 2021-05-26: qty 5

## 2021-05-26 MED ORDER — SODIUM CHLORIDE 0.9 % IV SOLN
150.0000 mg | Freq: Once | INTRAVENOUS | Status: AC
  Administered 2021-05-26: 11:00:00 150 mg via INTRAVENOUS
  Filled 2021-05-26: qty 150

## 2021-05-26 MED ORDER — SODIUM CHLORIDE 0.9 % IV SOLN
200.0000 mg | Freq: Once | INTRAVENOUS | Status: AC
  Administered 2021-05-26: 12:00:00 200 mg via INTRAVENOUS
  Filled 2021-05-26: qty 8

## 2021-05-26 MED ORDER — DIPHENHYDRAMINE HCL 50 MG/ML IJ SOLN: 50.0000 mg | Freq: Once | INTRAMUSCULAR | Status: DC

## 2021-05-26 MED ORDER — HEPARIN SOD (PORK) LOCK FLUSH 100 UNIT/ML IV SOLN
INTRAVENOUS | Status: AC
  Administered 2021-05-26: 13:00:00 500 [IU]
  Filled 2021-05-26: qty 5

## 2021-05-26 MED ORDER — SODIUM CHLORIDE 0.9 % IV SOLN
Freq: Once | INTRAVENOUS | Status: AC
  Filled 2021-05-26: qty 250

## 2021-05-26 MED ORDER — PEGFILGRASTIM 6 MG/0.6ML ~~LOC~~ PSKT
6.0000 mg | PREFILLED_SYRINGE | Freq: Once | SUBCUTANEOUS | Status: AC
  Administered 2021-05-26: 13:00:00 6 mg via SUBCUTANEOUS

## 2021-05-26 NOTE — Patient Instructions (Signed)
[facility]  Discharge Instructions: Thank you for choosing [facility] to provide your oncology and hematology care.  If you have a lab appointment with the [facility], please go directly to the [facility] and check in at the registration area.  Wear comfortable clothing and clothing appropriate for easy access to any Portacath or PICC line.   We strive to give you quality time with your provider. You may need to reschedule your appointment if you arrive late (15 or more minutes).  Arriving late affects you and other patients whose appointments are after yours.  Also, if you miss three or more appointments without notifying the office, you may be dismissed from the clinic at the provider's discretion.      For prescription refill requests, have your pharmacy contact our office and allow 72 hours for refills to be completed.    Today you received the following chemotherapy and/or immunotherapy agents keytruda & carboplatin    To help prevent nausea and vomiting after your treatment, we encourage you to take your nausea medication as directed.  BELOW ARE SYMPTOMS THAT SHOULD BE REPORTED IMMEDIATELY: *FEVER GREATER THAN 100.4 F (38 C) OR HIGHER *CHILLS OR SWEATING *NAUSEA AND VOMITING THAT IS NOT CONTROLLED WITH YOUR NAUSEA MEDICATION *UNUSUAL SHORTNESS OF BREATH *UNUSUAL BRUISING OR BLEEDING *URINARY PROBLEMS (pain or burning when urinating, or frequent urination) *BOWEL PROBLEMS (unusual diarrhea, constipation, pain near the anus) TENDERNESS IN MOUTH AND THROAT WITH OR WITHOUT PRESENCE OF ULCERS (sore throat, sores in mouth, or a toothache) UNUSUAL RASH, SWELLING OR PAIN  UNUSUAL VAGINAL DISCHARGE OR ITCHING   Items with * indicate a potential emergency and should be followed up as soon as possible or go to the Emergency Department if any problems should occur.  Please show the CHEMOTHERAPY ALERT CARD or IMMUNOTHERAPY ALERT CARD at  check-in to the Emergency Department and triage nurse.  Should you have questions after your visit or need to cancel or reschedule your appointment, please contact [facility]  [phone] and follow the prompts.  Office hours are 8:00 a.m. to 4:30 p.m. Monday - Friday. Please note that voicemails left after 4:00 p.m. may not be returned until the following business day.  We are closed weekends and major holidays. You have access to a nurse at all times for urgent questions. Please call the main number to the clinic [phone] and follow the prompts.  For any non-urgent questions, you may also contact your provider using MyChart. We now offer e-Visits for anyone 57 and older to request care online for non-urgent symptoms. For details visit [email or web address].   Also download the MyChart app! Go to the app store, search "MyChart", open the app, select , and log in with your MyChart username and password.  Due to Covid, a mask is required upon entering the hospital/clinic. If you do not have a mask, one will be given to you upon arrival. For doctor visits, patients may have 1 support person aged 39 or older with them. For treatment visits, patients cannot have anyone with them due to current Covid guidelines and our immunocompromised population.

## 2021-05-26 NOTE — Progress Notes (Signed)
Nutrition Follow-up:  Patient with stage IV squamous cell lung cancer.  Noted patient with progression on 04/22/21 CT per MD note and started on carboplatin/taxol/keytruda. Patient did not tolerate this regimen and developed severe anemia.  Patient now on carboplatin and keytruda, holding taxol.  Met with patient during infusion.  Patient reports decreased appetite on prior treatment.  Started on dronabinol. Says that he thinks it is helping a little bit.  Says that he can't tolerate ensure shakes anymore.  Yesterday was able to eat chicken biscuit, banana and orange juice.  Lunch was blacken chicken with cabbage and macaroni and cheese.  Snacked on cheese and crackers last evening.    Medications: reviewed  Labs: reviewed  Anthropometrics:   Weight 132 lb on 11/16  130 lb 4.8 oz on 11/8 137 lb 8 oz on 8/16 138 lb on 6/14 135 lb on 5/24 136 lb on 4/12 137 lb on 3/21   NUTRITION DIAGNOSIS: Inadequate oral intake stable   INTERVENTION:  Encouraged patient to try alternative oral nutrition supplement Reviewed ways to add calories and protein into diet.  Handout provided along with snack list.     MONITORING, EVALUATION, GOAL: weight trends, intake   NEXT VISIT: Thursday, Dec 8 during infusion  Rene Sizelove B. Zenia Resides, [place], [place] Registered Dietitian [phone] (mobile)

## 2021-05-30 ENCOUNTER — Ambulatory Visit: Payer: Medicare Other

## 2021-06-08 ENCOUNTER — Other Ambulatory Visit: Payer: Self-pay

## 2021-06-08 ENCOUNTER — Telehealth: Payer: Self-pay

## 2021-06-08 ENCOUNTER — Encounter: Payer: Self-pay | Admitting: Oncology

## 2021-06-08 ENCOUNTER — Inpatient Hospital Stay: Payer: 59

## 2021-06-08 ENCOUNTER — Inpatient Hospital Stay (HOSPITAL_BASED_OUTPATIENT_CLINIC_OR_DEPARTMENT_OTHER): Payer: 59 | Admitting: Oncology

## 2021-06-08 VITALS — BP 127/79 | HR 112 | Temp 97.7°F | Resp 18 | Wt 126.1 lb

## 2021-06-08 DIAGNOSIS — C3491 Malignant neoplasm of unspecified part of right bronchus or lung: Secondary | ICD-10-CM

## 2021-06-08 DIAGNOSIS — D6481 Anemia due to antineoplastic chemotherapy: Secondary | ICD-10-CM | POA: Insufficient documentation

## 2021-06-08 DIAGNOSIS — Z5112 Encounter for antineoplastic immunotherapy: Secondary | ICD-10-CM

## 2021-06-08 DIAGNOSIS — I2699 Other pulmonary embolism without acute cor pulmonale: Secondary | ICD-10-CM | POA: Insufficient documentation

## 2021-06-08 DIAGNOSIS — E46 Unspecified protein-calorie malnutrition: Secondary | ICD-10-CM | POA: Insufficient documentation

## 2021-06-08 DIAGNOSIS — T451X5A Adverse effect of antineoplastic and immunosuppressive drugs, initial encounter: Secondary | ICD-10-CM | POA: Insufficient documentation

## 2021-06-08 DIAGNOSIS — E43 Unspecified severe protein-calorie malnutrition: Secondary | ICD-10-CM | POA: Diagnosis not present

## 2021-06-08 DIAGNOSIS — R000 Tachycardia, unspecified: Secondary | ICD-10-CM | POA: Diagnosis not present

## 2021-06-08 LAB — COMPREHENSIVE METABOLIC PANEL
ALT: 12 U/L (ref 0–44)
AST: 15 U/L (ref 15–41)
Albumin: 3.2 g/dL — ABNORMAL LOW (ref 3.5–5.0)
Alkaline Phosphatase: 115 U/L (ref 38–126)
Anion gap: 9 (ref 5–15)
BUN: 14 mg/dL (ref 6–20)
CO2: 26 mmol/L (ref 22–32)
Calcium: 9.3 mg/dL (ref 8.9–10.3)
Chloride: 101 mmol/L (ref 98–111)
Creatinine, Ser: 0.97 mg/dL (ref 0.61–1.24)
GFR, Estimated: >60 mL/min (ref >60)
Glucose, Bld: 100 mg/dL — ABNORMAL HIGH (ref 70–99)
Potassium: 4.1 mmol/L (ref 3.5–5.1)
Sodium: 136 mmol/L (ref 135–145)
Total Bilirubin: 0.7 mg/dL (ref 0.3–1.2)
Total Protein: 8.7 g/dL — ABNORMAL HIGH (ref 6.5–8.1)

## 2021-06-08 LAB — CBC WITH DIFFERENTIAL/PLATELET
Abs Immature Granulocytes: 0.17 10*3/uL — ABNORMAL HIGH (ref 0.00–0.07)
Basophils Absolute: 0.1 10*3/uL (ref 0.0–0.1)
Basophils Relative: 0 %
Eosinophils Absolute: 0 10*3/uL (ref 0.0–0.5)
Eosinophils Relative: 0 %
HCT: 30.3 % — ABNORMAL LOW (ref 39.0–52.0)
Hemoglobin: 10.1 g/dL — ABNORMAL LOW (ref 13.0–17.0)
Immature Granulocytes: 1 %
Lymphocytes Relative: 6 %
Lymphs Abs: 1.3 10*3/uL (ref 0.7–4.0)
MCH: 33.3 pg (ref 26.0–34.0)
MCHC: 33.3 g/dL (ref 30.0–36.0)
MCV: 100 fL (ref 80.0–100.0)
Monocytes Absolute: 0.8 10*3/uL (ref 0.1–1.0)
Monocytes Relative: 3 %
Neutro Abs: 20.1 10*3/uL — ABNORMAL HIGH (ref 1.7–7.7)
Neutrophils Relative %: 90 %
Platelets: 142 10*3/uL — ABNORMAL LOW (ref 150–400)
RBC: 3.03 MIL/uL — ABNORMAL LOW (ref 4.22–5.81)
RDW: 17.8 % — ABNORMAL HIGH (ref 11.5–15.5)
WBC: 22.4 10*3/uL — ABNORMAL HIGH (ref 4.0–10.5)
nRBC: 0 % (ref 0.0–0.2)

## 2021-06-08 LAB — TSH: TSH: 0.956 u[IU]/mL (ref 0.350–4.500)

## 2021-06-08 LAB — SAMPLE TO BLOOD BANK

## 2021-06-08 LAB — T4, FREE: Free T4: 0.88 ng/dL (ref 0.61–1.12)

## 2021-06-08 MED ORDER — APIXABAN 5 MG PO TABS: 5.0000 mg | ORAL_TABLET | Freq: Two times a day (BID) | ORAL | 3 refills | Status: DC

## 2021-06-08 MED ORDER — POTASSIUM CHLORIDE ER 20 MEQ PO TBCR: 1.0000 | EXTENDED_RELEASE_TABLET | Freq: Two times a day (BID) | ORAL | 2 refills | Status: DC

## 2021-06-08 MED ORDER — DRONABINOL 5 MG PO CAPS: 5.0000 mg | ORAL_CAPSULE | Freq: Two times a day (BID) | ORAL | 0 refills | Status: DC

## 2021-06-08 NOTE — Telephone Encounter (Signed)
Patient brought Attending physician statement form to be filled out for cruise cancellation. Form has been completed and uploaded to [email or web address]   Reference #: [id number]

## 2021-06-08 NOTE — Progress Notes (Signed)
Hematology/Oncology Follow up note [phone] [phone]   Patient Care Team: Tracie Harrier, MD as PCP - General (Internal Medicine) Earlie Server, MD as Consulting Physician (Hematology and Oncology)  REASON FOR VISIT:  Follow up for treatment of squamous lung cancer.   HISTORY OF PRESENTING ILLNESS:  # Dec 2019 Stage I squamous lung cancer #s/p Bronchoscopy on 06/25/2018. subcarina EBUS FNA was non diagnostic, hypocellular specimen.  # 08/13/2018 CT guided right upper lobe biopsy pathology showed dense fibrosis and mixed inflammatory cells with prominent polytypic plasma cells component. Focal benign bronchial wall and alveolar spaces. No malignancy was identified.   # 08/13/2018 underwent right thoracotomy and wedge resection of a right upper lobe mass.  Frozen section was consistent with an inflammatory process.  On the second postop day, preliminary pathology reports high-grade malignancy.  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  08/20/2018 Patient was therefore taken back to the OR and underwent take complete lobectomy  pT1b pN0 cM0 stage I squamous cell lung cancer.  Margin is negative.  Recommend observation.    # 12/03/2018 CT chest w contrast showed local recurrence.  12/10/2018 PET showed No evidence of distant metastatic disease # 12/26/2018 s/p bronchoscopy biopsy. Confirmed local recurrence of squamous cell lung cancer.  medi port placed by Dr.Oaks.  # 06/14/2020 CT chest w contrast was reviewed and discussed with patient.  Evidence of progressive lymphangitic spread of tumor throughout the right lung, with contralateral lung nodules with  right pleural effusion  MRI brain is negative for CNS involvement.  CT abdomen with contrast showed no evidence of abdominal metastatic disease PET scan was not approved by insurance-peer to peer appeal with Dr.Vipul Bhanderi   # NGS - KRAS V14L,  TMB 26.6-high, MS stable, PD-L1 5% CDKN2C G48, DICER1c.2117-1G>T,  FLT4 G1131S, NF1 A434f, NF1 L2643*, STK11 N188fCancer treatment Started concurrent chemoradiation on 01/16/2019 Carboplatin AUC of 2 and Taxol 45 mg/m2 weekly finished in 03/05/2019 04/10/2019, patient started on durvalumab maintenance. -CT showed progression.  Insurance denied PET scan evaluation. 07/16/2020-09/28/2020 4 cycles of carboplatin/paclitaxel/Keytruda  10/15/2020, CT chest abdomen pelvis redemonstrated postoperative and postradiation appearance of the right chest with perihilar consolidation and fibrosis.  Slight interval decrease in size of the pulmonary nodules associated with interlobular septal thickening nearly complete resolution of previously seen left-sided pulmonary nodules.  Minimal irregular residual.  Right pleural effusion improved.  Consistent with treatment response.  No evidence of metastatic disease with the abdomen or pelvis.  None obstructive bilateral nephrolithiasis - partial response to the treatment- 10/19/2020, Keytruda monotherapy maintenance  12/15/2020, CT chest without contrast showed resolution of lung nodules, no evidence of recurrent disease 12/30/2020, patient was admitted due to unprovoked extensive deep vein thrombosis from calf vein to the common femoral vein on the left.  Patient was started on heparin drip.  Patient underwent thrombolysis and is a colectomy on 12/31/2020.  Anticoagulation was switched to Eliquis at discharge.  04/22/2021, CT chest with contrast showed none occlusive pulmonary embolism, Interval progression of consolidation airspace opacity in the medial right middle lobe with bandlike opacity and bronchiectasis in the perihilar right lung,-this is compatible with evolving posttreatment changes although recurrent disease anteriorly is not excluded. Interval development of numerous bilateral pulmonary nodules, measuring up to 11 mm in the right lung base.  Concerning for metastatic disease.  Tiny right pleural effusion.   04/26/2021, resumed on  carboplatin/Taxol/Keytruda. Patient did not tolerate his chemotherapy of carboplatin/Taxol/Keytruda on 04/26/2021. Patient has developed severe anemia status post PRBC transfusion.  INTERVAL HISTORY ARINZE Choi is a 55 y.o. male who has above history reviewed by me today presents for reatment of  recurrent  squamous cell carcinoma. Today he was accompanied by his wife.  He reports feeling much better.  No nausea vomiting diarrhea. SOB is at baseline.  Marinol helps his appetite and he is eating more. Weight loss of 6 pounds since last visit.      Review of Systems  Constitutional:  Positive for fatigue and unexpected weight change. Negative for appetite change, chills and fever.  HENT:   Negative for hearing loss and voice change.   Eyes:  Negative for eye problems and icterus.  Respiratory:  Positive for shortness of breath. Negative for chest tightness and cough.   Cardiovascular:  Negative for chest pain and leg swelling.  Gastrointestinal:  Negative for abdominal distention, abdominal pain, nausea and vomiting.  Endocrine: Negative for hot flashes.  Genitourinary:  Negative for difficulty urinating, dysuria and frequency.   Musculoskeletal:  Negative for arthralgias.  Skin:  Negative for itching and rash.  Neurological:  Negative for light-headedness and numbness.  Hematological:  Negative for adenopathy. Does not bruise/bleed easily.  Psychiatric/Behavioral:  Negative for confusion.    MEDICAL HISTORY:  Past Medical History:  Diagnosis Date   Alcohol abuse    usually drinks 2-3 drinks per day   Atherosclerosis 06/2018   Chronic sinusitis    Dehydration 02/07/2019   Emphysema of lung ([place]) 06/2018   patient unaware of this.   Hip fracture ([place]) 05/2018   no surgery   History of kidney stones 05/2018   per xray, bilateral nephrolitiasis   Hypertension    Squamous cell carcinoma of lung, right ([place]) 06/2018    SURGICAL HISTORY: Past Surgical History:  Procedure  Laterality Date   BRAIN SURGERY  10/2017   nasal/sinus endoscopy. mass benign   ELECTROMAGNETIC NAVIGATION BROCHOSCOPY Right 06/25/2018   Procedure: ELECTROMAGNETIC NAVIGATION BRONCHOSCOPY;  Surgeon: [name], MD;  Location: [facility];  Service: Cardiopulmonary;  Laterality: Right;   ENDOBRONCHIAL ULTRASOUND Right 06/25/2018   Procedure: ENDOBRONCHIAL ULTRASOUND;  Surgeon: [name], MD;  Location: [facility];  Service: Cardiopulmonary;  Laterality: Right;   ENDOBRONCHIAL ULTRASOUND Right 12/26/2018   Procedure: ENDOBRONCHIAL ULTRASOUND RIGHT;  Surgeon: [name], MD;  Location: [facility];  Service: Cardiopulmonary;  Laterality: Right;   FLEXIBLE BRONCHOSCOPY N/A 08/20/2018   Procedure: FLEXIBLE BRONCHOSCOPY PREOP;  Surgeon: [name], MD;  Location: [facility];  Service: Thoracic;  Laterality: N/A;   IR CV LINE INJECTION  04/04/2019   NASAL SINUS SURGERY  10/2017   [facility], frontal sinusotomy, ethmoidectomy, resection anterior cranial fossa neoplasm, turbinate resection   PERIPHERAL VASCULAR THROMBECTOMY Left 12/31/2020   Procedure: PERIPHERAL VASCULAR THROMBECTOMY;  Surgeon: Algernon Huxley, MD;  Location: [facility];  Service: Cardiovascular;  Laterality: Left;   PORTACATH PLACEMENT Left 01/15/2019   Procedure: INSERTION PORT-A-CATH;  Surgeon: [name], MD;  Location: [facility];  Service: General;  Laterality: Left;   THORACOTOMY Right 08/13/2018   Procedure: PREOP BROCHOSCOPY WITH RIGHT THORACOTOMY AND RUL RESECTION;  Surgeon: [name], MD;  Location: [facility];  Service: General;  Laterality: Right;   THORACOTOMY Right 08/20/2018   Procedure: THORACOTOMY MAJOR RIGHT UPPER LOBE LOBECTOMY;  Surgeon: [name], MD;  Location: [facility];  Service: Thoracic;  Laterality: Right;   TOE SURGERY Left    pin in left toe    SOCIAL HISTORY: Social History   Socioeconomic History   Marital status:  Married    Spouse name: lisa   Number of children: Not on file   Years of  education: Not on file   Highest education level: Not on file  Occupational History   Occupation: welding    Comment: taking time off to resolve issues  Tobacco Use   Smoking status: Former    Packs/day: 0.50    Years: 15.00    Pack years: 7.50    Types: Cigarettes    Quit date: 08/2018    Years since quitting: 2.8   Smokeless tobacco: Never  Vaping Use   Vaping Use: Never used  Substance and Sexual Activity   Alcohol use: Yes    Alcohol/week: 3.0 standard drinks    Types: 3 Cans of beer per week    Comment: usually 2 drinks per week, per patient   Drug use: No   Sexual activity: Not on file  Other Topics Concern   Not on file  Social History Narrative   Not on file   Social Determinants of Health   Financial Resource Strain: Not on file  Food Insecurity: Not on file  Transportation Needs: Not on file  Physical Activity: Not on file  Stress: Not on file  Social Connections: Not on file  Intimate Partner Violence: Not on file    FAMILY HISTORY: Family History  Problem Relation Age of Onset   Breast cancer Mother    Diabetes Mother    Lung cancer Father    Hypertension Father     ALLERGIES:  has No Known Allergies.  MEDICATIONS:  Current Outpatient Medications  Medication Sig Dispense Refill   albuterol (VENTOLIN HFA) 108 (90 Base) MCG/ACT inhaler INHALE 2 PUFFS BY MOUTH EVERY 6 HOURS AS NEEDED FOR WHEEZE OR SHORTNESS OF BREATH 8.5 g 5   apixaban (ELIQUIS) 5 MG TABS tablet Take 1 tablet (5 mg total) by mouth 2 (two) times daily. 60 tablet 3   budesonide-formoterol (SYMBICORT) 80-4.5 MCG/ACT inhaler Inhale 2 puffs into the lungs 2 (two) times daily. in the morning and at bedtime. 10.2 each 6   diltiazem (CARDIZEM CD) 120 MG 24 hr capsule Take 120 mg by mouth daily.     folic acid (V-R FOLIC ACID) 144 MCG tablet Take 1 tablet (400 mcg total) by mouth daily. 90 tablet 1   hydrALAZINE (APRESOLINE) 100 MG tablet Take 50 mg by mouth 2 (two) times daily.      HYDROcodone-acetaminophen (NORCO/VICODIN) 5-325 MG tablet Take 1 tablet by mouth every 6 (six) hours as needed for severe pain. 10 tablet 0   latanoprost (XALATAN) 0.005 % ophthalmic solution SMARTSIG:In Eye(s)     loratadine (CLARITIN) 10 MG tablet Take 10 mg by mouth daily.     magnesium chloride (SLOW-MAG) 64 MG TBEC SR tablet Take 1 tablet (64 mg total) by mouth 2 (two) times daily. TAKE 1 TABLET (64 MG TOTAL) BY MOUTH DAILY. (Patient taking differently: Take 1 tablet by mouth daily. TAKE 1 TABLET (64 MG TOTAL) BY MOUTH DAILY.) 60 tablet 1   methocarbamol (ROBAXIN) 500 MG tablet Take 1 tablet (500 mg total) by mouth at bedtime. 30 tablet 0   metoprolol succinate (TOPROL-XL) 25 MG 24 hr tablet Take 1 tablet (25 mg total) by mouth daily. 30 tablet 0   nystatin (MYCOSTATIN) 100000 UNIT/ML suspension Take 5 mLs (500,000 Units total) by mouth 4 (four) times daily. Swish and swallow. 473 mL 1   olmesartan (BENICAR) 40 MG tablet Take 40 mg by mouth every other day.  ondansetron (ZOFRAN) 8 MG tablet Take 1 tablet (8 mg total) by mouth every 8 (eight) hours as needed for refractory nausea / vomiting. Start on day 3 after chemo. 90 tablet 3   pantoprazole (PROTONIX) 20 MG tablet Take 1 tablet (20 mg total) by mouth daily. 60 tablet 2   Potassium Chloride ER 20 MEQ TBCR Take 1 tablet by mouth 2 (two) times daily.     promethazine (PHENERGAN) 25 MG tablet Take 1 tablet (25 mg total) by mouth every 6 (six) hours as needed for nausea or vomiting. 30 tablet 0   SPIRIVA HANDIHALER 18 MCG inhalation capsule INHALE 1 CAPSULE VIA HANDIHALER ONCE DAILY AT THE SAME TIME EVERY DAY 30 capsule 0   vitamin B-12 (CYANOCOBALAMIN) 1000 MCG tablet Take 1 tablet (1,000 mcg total) by mouth daily. 90 tablet 1   dronabinol (MARINOL) 5 MG capsule Take 1 capsule (5 mg total) by mouth 2 (two) times daily before a meal. 60 capsule 0   No current facility-administered medications for this visit.   Facility-Administered  Medications Ordered in Other Visits  Medication Dose Route Frequency Provider Last Rate Last Admin   dexamethasone (DECADRON) 10 MG/ML injection            heparin lock flush 100 unit/mL  500 Units Intravenous Once Earlie Server, MD       sodium chloride flush (NS) 0.9 % injection 10 mL  10 mL Intravenous PRN Earlie Server, MD   10 mL at 04/04/19 0903   sodium chloride flush (NS) 0.9 % injection 10 mL  10 mL Intravenous PRN Earlie Server, MD   10 mL at 07/26/20 1308     PHYSICAL EXAMINATION: ECOG PERFORMANCE STATUS: 1 - Symptomatic but completely ambulatory Vitals:   06/08/21 0958  BP: 127/79  Pulse: (!) 112  Resp: 18  Temp: 97.7 F (36.5 C)  SpO2: 100%   Filed Weights   06/08/21 0958  Weight: 126 lb 1.6 oz (57.2 kg)    Physical Exam Constitutional:      General: He is not in acute distress. HENT:     Head: Normocephalic and atraumatic.     Mouth/Throat:     Comments: Thrush  Eyes:     General: No scleral icterus. Cardiovascular:     Rate and Rhythm: Normal rate and regular rhythm.     Heart sounds: Normal heart sounds.  Pulmonary:     Effort: Pulmonary effort is normal. No respiratory distress.     Breath sounds: No wheezing.     Comments: Decreased breath sound bilaterally Abdominal:     General: Bowel sounds are normal. There is no distension.     Palpations: Abdomen is soft.  Musculoskeletal:        General: No deformity. Normal range of motion.     Cervical back: Normal range of motion and neck supple.  Skin:    General: Skin is warm and dry.     Findings: No erythema or rash.  Neurological:     Mental Status: He is alert and oriented to person, place, and time. Mental status is at baseline.     Cranial Nerves: No cranial nerve deficit.     Coordination: Coordination normal.  Psychiatric:        Mood and Affect: Mood normal.     LABORATORY DATA:  I have reviewed the data as listed Lab Results  Component Value Date   WBC 22.4 (H) 06/08/2021   HGB 10.1 (L)  06/08/2021   HCT  30.3 (L) 06/08/2021   MCV 100.0 06/08/2021   PLT 142 (L) 06/08/2021   Recent Labs    08/31/20 0812 09/07/20 1117 05/19/21 0823 05/25/21 0831 06/08/21 0935  NA 137   < > 133* 131* 136  K 3.1*   < > 4.0 4.0 4.1  CL 100   < > 100 96* 101  CO2 25   < > _0 GLUCOSE 107*   < > 117* 111* 100*  BUN 7   < > _1 CREATININE 0.79   < > 1.10 1.00 0.97  CALCIUM 8.9   < > 8.6* 8.7* 9.3  GFRNONAA >60   < > >60 >60 >60  PROT 7.4   < > 7.7 8.1 8.7*  ALBUMIN 3.4*   < > 2.5* 2.7* 3.2*  AST 31   < > _2 ALT 13   < > _3 ALKPHOS 101   < > 100 90 115  BILITOT 2.0*   < > 0.7 0.2* 0.7  BILIDIR 0.4*  --   --   --   --    < > = values in this interval not displayed.    Iron/TIBC/Ferritin/ %Sat No results found for: IRON, TIBC, FERRITIN, IRONPCTSAT   RADIOGRAPHIC STUDIES: I have personally reviewed the radiological images as listed and agreed with the findings in the report. CT CHEST W CONTRAST  Addendum Date: 04/22/2021   ADDENDUM REPORT: 04/22/2021 13:25 ADDENDUM: Critical Value/emergent results were called by telephone at the time of interpretation on 04/22/2021 at 1:24 pm to provider Evaan Tidwell , who verbally acknowledged these results. Electronically Signed   By: Misty Stanley M.D.   On: 04/22/2021 13:25   Result Date: 04/22/2021 CLINICAL DATA:  Lung cancer restaging. EXAM: CT CHEST WITH CONTRAST TECHNIQUE: Multidetector CT imaging of the chest was performed during intravenous contrast administration. CONTRAST:  48m OMNIPAQUE IOHEXOL 350 MG/ML SOLN COMPARISON:  12/15/2020 FINDINGS: Cardiovascular: The heart size is normal. No substantial pericardial effusion. Mild atherosclerotic calcification is noted in the wall of the thoracic aorta. Left Port-A-Cath tip is positioned in the mid to distal SVC. Nonocclusive pulmonary embolus is identified in lobar right middle and lower lobe pulmonary arteries. Thrombus has a adherent quality in some regions suggesting that  this may be nonacute. Mediastinum/Nodes: No mediastinal lymphadenopathy. There is no hilar lymphadenopathy. Post treatment changes in the parahilar right lung are similar to prior study performed without intravenous contrast material. The esophagus has normal imaging features. There is no axillary lymphadenopathy. Lungs/Pleura: Interval progression of consolidative airspace opacity in the medial right middle lobe with bandlike opacity and bronchiectasis in the parahilar right lung. Volume loss right hemithorax is compatible with upper lobectomy. Nodular areas of airspace disease in the posterior right lung are similar to prior. The patient has a new irregular 11 mm nodule in the anterior right lung base with central cavitation (103/3). Multiple new tiny nodules are seen in the right lung base measuring 3-4 mm (see images 89, 97, 101, and 109. Numerous new pulmonary nodules in the left lung are also evident ranging in size from 3 mm up to about 4 mm maximum size (99/3). Tiny right pleural effusion. Upper Abdomen: Unremarkable. Musculoskeletal: No worrisome lytic or sclerotic osseous abnormality. IMPRESSION: 1. Nonocclusive pulmonary embolus in lobar right middle and lower lobe pulmonary arteries. Thrombus appears adherent in some regions, potentially nonacute. 2. Interval progression of consolidative airspace opacity in the medial right middle lobe with  bandlike opacity and bronchiectasis in the parahilar right lung. Imaging features are compatible with evolving post treatment change although recurrent disease anteriorly is not excluded. 3. Interval development of numerous bilateral pulmonary nodules, measuring up to 11 mm in the right lung base. Imaging features are highly concerning for metastatic disease. 4. Tiny right pleural effusion. 5. Aortic Atherosclerosis (ICD10-I70.0). Electronically Signed: By: Misty Stanley M.D. On: 04/22/2021 12:47   MR PROSTATE W WO CONTRAST  Result Date: 04/09/2021 CLINICAL DATA:   Elevated PSA in a 56 year old male also with history of lung cancer. Most recent PSA of 8.2 EXAM: MR PROSTATE WITHOUT AND WITH CONTRAST TECHNIQUE: Multiplanar multisequence MRI images were obtained of the pelvis centered about the prostate. Pre and post contrast images were obtained. CONTRAST:  17m GADAVIST GADOBUTROL 1 MMOL/ML IV SOLN COMPARISON:  CT chest, abdomen and pelvis of April 2022. FINDINGS: Prostate: Transitional zone: Signs of BPH without visible high-risk lesion. Peripheral zone: Linear and wedge-shaped areas of T2 hypointensity throughout the peripheral zone. Findings of low signal in the peripheral zone at the RIGHT base on T2 slightly more focal, not associated with restricted diffusion. PIRADS category 3 (image 36/13 and image 14/42) Volume:  21 cc PSA density: 0.37 ng/mL Transcapsular spread:  Absent Seminal vesicle involvement: Absent Neurovascular bundle involvement: Absent Pelvic adenopathy: Absent Bone metastasis: Absent Other findings: Sigmoid diverticular disease. IMPRESSION: PIRADS category 3 lesion in the RIGHT base peripheral zone as discussed. Elevated PSA density as discussed above. Findings may warrant close follow-up. Would also correlate with any recent symptoms that would suggest prostatitis. Electronically Signed   By: GZetta BillsM.D.   On: 04/09/2021 15:42      ASSESSMENT & PLAN:  1. Squamous cell carcinoma of right lung (HCC)   2. Other acute pulmonary embolism without acute cor pulmonale ([place]   3. Antineoplastic chemotherapy induced anemia   4. Severe protein-calorie malnutrition ([place]   5. Tachycardia    #Recurrent Squamous cell carcinoma of lung, stage IV 07/16/2020-09/28/2020 carboplatin Taxol and Keytruda x 1  partial response -Keytruda maintenance.-04/22/2021, progression-resumed on carboplatin/Taxol/Keytruda - did not tolerate due to myelosuppression.  05/25/2021, patient had carboplatin/Keytruda.  Tolerates better. Labs are reviewed and discussed with  patient. Hb has improved.  No need for transfusion.   #Tachycardia, EKG was obtained and reviewed by me.  Sinus tachycardia.  Encourage oral hydration.  Likely due to deconditioning. #Chemotherapy-induced myelosuppression/anemia.  Hemoglobin has improved since last visit.  Continue to monitor.  #Leukocytosis due to G-CSF support. #Weight loss, continue follow-up with nutritionist.  Encouraged nutrition supplementation.  #Shortness of breath with exertion.  Combination of COPD/emphysema/lung cancer/symptomatic anemia.. Continue albuterol as needed, Symbicort  #Unprovoked left lower extremity DVT, status post thrombolysis/thrombectomy. Nonocclusive PE noted on recent CT. Continue Eliquis 5 mg twice daily.  #Chronic hypomagnesia.  Continue slow magnesium twice daily.   #Chronic hypokalemia, improved.  Potassium is normal.  Continue potassium chloride 20 mEq twice daily.     return of visit:  Follow-up with me in 1 week lab MD carboplatin, Keytruda.    ZEarlie Server MD, PhD  06/08/2021

## 2021-06-08 NOTE — Progress Notes (Signed)
Pt here for follow up. No new concerns voiced.   

## 2021-06-09 ENCOUNTER — Inpatient Hospital Stay: Payer: 59

## 2021-06-13 ENCOUNTER — Other Ambulatory Visit: Payer: Self-pay

## 2021-06-13 DIAGNOSIS — C3491 Malignant neoplasm of unspecified part of right bronchus or lung: Secondary | ICD-10-CM

## 2021-06-13 MED ORDER — PROMETHAZINE HCL 25 MG PO TABS: 25.0000 mg | ORAL_TABLET | Freq: Four times a day (QID) | ORAL | 0 refills | Status: DC | PRN

## 2021-06-13 MED ORDER — ONDANSETRON HCL 8 MG PO TABS: 8.0000 mg | ORAL_TABLET | Freq: Three times a day (TID) | ORAL | 1 refills | Status: DC | PRN

## 2021-06-16 ENCOUNTER — Encounter: Payer: Self-pay | Admitting: Oncology

## 2021-06-16 ENCOUNTER — Inpatient Hospital Stay: Payer: 59

## 2021-06-16 ENCOUNTER — Inpatient Hospital Stay: Payer: 59 | Attending: Oncology

## 2021-06-16 ENCOUNTER — Other Ambulatory Visit: Payer: Self-pay

## 2021-06-16 ENCOUNTER — Inpatient Hospital Stay (HOSPITAL_BASED_OUTPATIENT_CLINIC_OR_DEPARTMENT_OTHER): Payer: 59 | Admitting: Oncology

## 2021-06-16 VITALS — BP 137/79 | HR 97 | Temp 97.2°F | Wt 131.0 lb

## 2021-06-16 DIAGNOSIS — Z5111 Encounter for antineoplastic chemotherapy: Secondary | ICD-10-CM | POA: Diagnosis present

## 2021-06-16 DIAGNOSIS — Z5112 Encounter for antineoplastic immunotherapy: Secondary | ICD-10-CM | POA: Diagnosis not present

## 2021-06-16 DIAGNOSIS — D6481 Anemia due to antineoplastic chemotherapy: Secondary | ICD-10-CM | POA: Insufficient documentation

## 2021-06-16 DIAGNOSIS — I2699 Other pulmonary embolism without acute cor pulmonale: Secondary | ICD-10-CM | POA: Diagnosis not present

## 2021-06-16 DIAGNOSIS — C3491 Malignant neoplasm of unspecified part of right bronchus or lung: Secondary | ICD-10-CM

## 2021-06-16 DIAGNOSIS — Z79899 Other long term (current) drug therapy: Secondary | ICD-10-CM | POA: Diagnosis not present

## 2021-06-16 DIAGNOSIS — Z7901 Long term (current) use of anticoagulants: Secondary | ICD-10-CM | POA: Diagnosis not present

## 2021-06-16 DIAGNOSIS — E876 Hypokalemia: Secondary | ICD-10-CM

## 2021-06-16 DIAGNOSIS — D696 Thrombocytopenia, unspecified: Secondary | ICD-10-CM

## 2021-06-16 DIAGNOSIS — C3411 Malignant neoplasm of upper lobe, right bronchus or lung: Secondary | ICD-10-CM | POA: Diagnosis present

## 2021-06-16 DIAGNOSIS — I10 Essential (primary) hypertension: Secondary | ICD-10-CM | POA: Diagnosis not present

## 2021-06-16 DIAGNOSIS — Z86718 Personal history of other venous thrombosis and embolism: Secondary | ICD-10-CM | POA: Insufficient documentation

## 2021-06-16 DIAGNOSIS — T451X5A Adverse effect of antineoplastic and immunosuppressive drugs, initial encounter: Secondary | ICD-10-CM | POA: Insufficient documentation

## 2021-06-16 DIAGNOSIS — Z95828 Presence of other vascular implants and grafts: Secondary | ICD-10-CM

## 2021-06-16 DIAGNOSIS — D6959 Other secondary thrombocytopenia: Secondary | ICD-10-CM | POA: Diagnosis not present

## 2021-06-16 LAB — COMPREHENSIVE METABOLIC PANEL
ALT: 11 U/L (ref 0–44)
AST: 18 U/L (ref 15–41)
Albumin: 3 g/dL — ABNORMAL LOW (ref 3.5–5.0)
Alkaline Phosphatase: 86 U/L (ref 38–126)
Anion gap: 12 (ref 5–15)
BUN: 13 mg/dL (ref 6–20)
CO2: 21 mmol/L — ABNORMAL LOW (ref 22–32)
Calcium: 9.1 mg/dL (ref 8.9–10.3)
Chloride: 102 mmol/L (ref 98–111)
Creatinine, Ser: 0.82 mg/dL (ref 0.61–1.24)
GFR, Estimated: >60 mL/min (ref >60)
Glucose, Bld: 135 mg/dL — ABNORMAL HIGH (ref 70–99)
Potassium: 3.7 mmol/L (ref 3.5–5.1)
Sodium: 135 mmol/L (ref 135–145)
Total Bilirubin: 0.3 mg/dL (ref 0.3–1.2)
Total Protein: 7.9 g/dL (ref 6.5–8.1)

## 2021-06-16 LAB — CBC WITH DIFFERENTIAL/PLATELET
Abs Immature Granulocytes: 0.05 10*3/uL (ref 0.00–0.07)
Basophils Absolute: 0.1 10*3/uL (ref 0.0–0.1)
Basophils Relative: 1 %
Eosinophils Absolute: 0.4 10*3/uL (ref 0.0–0.5)
Eosinophils Relative: 3 %
HCT: 27.3 % — ABNORMAL LOW (ref 39.0–52.0)
Hemoglobin: 9.1 g/dL — ABNORMAL LOW (ref 13.0–17.0)
Immature Granulocytes: 0 %
Lymphocytes Relative: 12 %
Lymphs Abs: 1.7 10*3/uL (ref 0.7–4.0)
MCH: 34.3 pg — ABNORMAL HIGH (ref 26.0–34.0)
MCHC: 33.3 g/dL (ref 30.0–36.0)
MCV: 103 fL — ABNORMAL HIGH (ref 80.0–100.0)
Monocytes Absolute: 0.7 10*3/uL (ref 0.1–1.0)
Monocytes Relative: 5 %
Neutro Abs: 11.1 10*3/uL — ABNORMAL HIGH (ref 1.7–7.7)
Neutrophils Relative %: 79 %
Platelets: 73 10*3/uL — ABNORMAL LOW (ref 150–400)
RBC: 2.65 MIL/uL — ABNORMAL LOW (ref 4.22–5.81)
RDW: 19.2 % — ABNORMAL HIGH (ref 11.5–15.5)
WBC: 14 10*3/uL — ABNORMAL HIGH (ref 4.0–10.5)
nRBC: 0 % (ref 0.0–0.2)

## 2021-06-16 MED ORDER — HEPARIN SOD (PORK) LOCK FLUSH 100 UNIT/ML IV SOLN
500.0000 [IU] | Freq: Once | INTRAVENOUS | Status: AC
  Administered 2021-06-16: 500 [IU] via INTRAVENOUS
  Filled 2021-06-16: qty 5

## 2021-06-16 MED ORDER — SODIUM CHLORIDE 0.9% FLUSH
10.0000 mL | Freq: Once | INTRAVENOUS | Status: AC
  Administered 2021-06-16: 10 mL via INTRAVENOUS
  Filled 2021-06-16: qty 10

## 2021-06-16 NOTE — Progress Notes (Signed)
Nutrition Follow-up:  Patient with stage IV squamous cell lung cancer.  Holding treatment today  Met with patient and wife in clinic.  Patient reports that his appetite is good and he has been eating well.  Denies anything effecting intake.  Says that he is taking marinol.  Yesterday ate pork chops, mashed potatoes with gravy and macaroni and cheese for breakfast. Ham sandwich with cheese for lunch and meatloaf with mashed potatoes for dinner.  Can't tolerate the 1 g sugar shakes (ensure max protein).  Has not been drinking any shakes.   Medications: reviewed  Labs: reviewed  Anthropometrics:   Weight 131 lb today, increased from  126 lb 1.6 oz on 11/30  132 lb on 11/16 130 lb 4.8 oz on 11/8 137 lb 8 oz on 8/16 138 lb on 6/14 135 lb on 5/24 136 lb on 4/12 137 lb on 3/21   NUTRITION DIAGNOSIS: Inadequate oral intake improved   INTERVENTION:  Samples of ensure complete, orgain, [employer] 1.4, reason shake given today with coupons for patient to try.  Patient to continue with high protein, high calorie foods     MONITORING, EVALUATION, GOAL: weight trends, intake   NEXT VISIT: to be determined with treatment  Azaliyah Kennard B. Zenia Resides, [place], [place] Registered Dietitian [phone] (mobile)

## 2021-06-16 NOTE — Progress Notes (Signed)
Hematology/Oncology Follow up note [phone] [phone]   Patient Care Team: Tracie Harrier, MD as PCP - General (Internal Medicine) Earlie Server, MD as Consulting Physician (Hematology and Oncology)  REASON FOR VISIT:  Follow up for treatment of squamous lung cancer.   HISTORY OF PRESENTING ILLNESS:  # Dec 2019 Stage I squamous lung cancer #s/p Bronchoscopy on 06/25/2018. subcarina EBUS FNA was non diagnostic, hypocellular specimen.  # 08/13/2018 CT guided right upper lobe biopsy pathology showed dense fibrosis and mixed inflammatory cells with prominent polytypic plasma cells component. Focal benign bronchial wall and alveolar spaces. No malignancy was identified.   # 08/13/2018 underwent right thoracotomy and wedge resection of a right upper lobe mass.  Frozen section was consistent with an inflammatory process.  On the second postop day, preliminary pathology reports high-grade malignancy.  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  08/20/2018 Patient was therefore taken back to the OR and underwent take complete lobectomy  pT1b pN0 cM0 stage I squamous cell lung cancer.  Margin is negative.  Recommend observation.    # 12/03/2018 CT chest w contrast showed local recurrence.  12/10/2018 PET showed No evidence of distant metastatic disease # 12/26/2018 s/p bronchoscopy biopsy. Confirmed local recurrence of squamous cell lung cancer.  medi port placed by Dr.Oaks.  # 06/14/2020 CT chest w contrast was reviewed and discussed with patient.  Evidence of progressive lymphangitic spread of tumor throughout the right lung, with contralateral lung nodules with  right pleural effusion  MRI brain is negative for CNS involvement.  CT abdomen with contrast showed no evidence of abdominal metastatic disease PET scan was not approved by insurance-peer to peer appeal with Dr.Vipul Bhanderi   # NGS - KRAS V14L,  TMB 26.6-high, MS stable, PD-L1 5% CDKN2C G48, DICER1c.2117-1G>T,  FLT4 G1131S, NF1 A434f, NF1 L2643*, STK11 N188fCancer treatment Started concurrent chemoradiation on 01/16/2019 Carboplatin AUC of 2 and Taxol 45 mg/m2 weekly finished in 03/05/2019 04/10/2019, patient started on durvalumab maintenance. -CT showed progression.  Insurance denied PET scan evaluation. 07/16/2020-09/28/2020 4 cycles of carboplatin/paclitaxel/Keytruda  10/15/2020, CT chest abdomen pelvis redemonstrated postoperative and postradiation appearance of the right chest with perihilar consolidation and fibrosis.  Slight interval decrease in size of the pulmonary nodules associated with interlobular septal thickening nearly complete resolution of previously seen left-sided pulmonary nodules.  Minimal irregular residual.  Right pleural effusion improved.  Consistent with treatment response.  No evidence of metastatic disease with the abdomen or pelvis.  None obstructive bilateral nephrolithiasis - partial response to the treatment- 10/19/2020, Keytruda monotherapy maintenance  12/15/2020, CT chest without contrast showed resolution of lung nodules, no evidence of recurrent disease 12/30/2020, patient was admitted due to unprovoked extensive deep vein thrombosis from calf vein to the common femoral vein on the left.  Patient was started on heparin drip.  Patient underwent thrombolysis and is a colectomy on 12/31/2020.  Anticoagulation was switched to Eliquis at discharge.  04/22/2021, CT chest with contrast showed none occlusive pulmonary embolism, Interval progression of consolidation airspace opacity in the medial right middle lobe with bandlike opacity and bronchiectasis in the perihilar right lung,-this is compatible with evolving posttreatment changes although recurrent disease anteriorly is not excluded. Interval development of numerous bilateral pulmonary nodules, measuring up to 11 mm in the right lung base.  Concerning for metastatic disease.  Tiny right pleural effusion.   04/26/2021, resumed on  carboplatin/Taxol/Keytruda. Patient did not tolerate his chemotherapy of carboplatin/Taxol/Keytruda on 04/26/2021. Patient has developed severe anemia status post PRBC transfusion.  INTERVAL HISTORY Samuel Choi is a 55 y.o. male who has above history reviewed by me today presents for reatment of  recurrent  squamous cell carcinoma. Today he was accompanied by his wife.  He feels well. No new complaints.      Review of Systems  Constitutional:  Positive for fatigue and unexpected weight change. Negative for appetite change, chills and fever.  HENT:   Negative for hearing loss and voice change.   Eyes:  Negative for eye problems and icterus.  Respiratory:  Positive for shortness of breath. Negative for chest tightness and cough.   Cardiovascular:  Negative for chest pain and leg swelling.  Gastrointestinal:  Negative for abdominal distention, abdominal pain, nausea and vomiting.  Endocrine: Negative for hot flashes.  Genitourinary:  Negative for difficulty urinating, dysuria and frequency.   Musculoskeletal:  Negative for arthralgias.  Skin:  Negative for itching and rash.  Neurological:  Negative for light-headedness and numbness.  Hematological:  Negative for adenopathy. Does not bruise/bleed easily.  Psychiatric/Behavioral:  Negative for confusion.    MEDICAL HISTORY:  Past Medical History:  Diagnosis Date   Alcohol abuse    usually drinks 2-3 drinks per day   Atherosclerosis 06/2018   Chronic sinusitis    Dehydration 02/07/2019   Emphysema of lung ([place]) 06/2018   patient unaware of this.   Hip fracture ([place]) 05/2018   no surgery   History of kidney stones 05/2018   per xray, bilateral nephrolitiasis   Hypertension    Squamous cell carcinoma of lung, right ([place]) 06/2018    SURGICAL HISTORY: Past Surgical History:  Procedure Laterality Date   BRAIN SURGERY  10/2017   nasal/sinus endoscopy. mass benign   ELECTROMAGNETIC NAVIGATION BROCHOSCOPY Right 06/25/2018    Procedure: ELECTROMAGNETIC NAVIGATION BRONCHOSCOPY;  Surgeon: [name], MD;  Location: [facility];  Service: Cardiopulmonary;  Laterality: Right;   ENDOBRONCHIAL ULTRASOUND Right 06/25/2018   Procedure: ENDOBRONCHIAL ULTRASOUND;  Surgeon: [name], MD;  Location: [facility];  Service: Cardiopulmonary;  Laterality: Right;   ENDOBRONCHIAL ULTRASOUND Right 12/26/2018   Procedure: ENDOBRONCHIAL ULTRASOUND RIGHT;  Surgeon: [name], MD;  Location: [facility];  Service: Cardiopulmonary;  Laterality: Right;   FLEXIBLE BRONCHOSCOPY N/A 08/20/2018   Procedure: FLEXIBLE BRONCHOSCOPY PREOP;  Surgeon: [name], MD;  Location: [facility];  Service: Thoracic;  Laterality: N/A;   IR CV LINE INJECTION  04/04/2019   NASAL SINUS SURGERY  10/2017   At [facility], frontal sinusotomy, ethmoidectomy, resection anterior cranial fossa neoplasm, turbinate resection   PERIPHERAL VASCULAR THROMBECTOMY Left 12/31/2020   Procedure: PERIPHERAL VASCULAR THROMBECTOMY;  Surgeon: Algernon Huxley, MD;  Location: [place] [facility];  Service: Cardiovascular;  Laterality: Left;   PORTACATH PLACEMENT Left 01/15/2019   Procedure: INSERTION PORT-A-CATH;  Surgeon: [name], MD;  Location: [facility];  Service: General;  Laterality: Left;   THORACOTOMY Right 08/13/2018   Procedure: PREOP BROCHOSCOPY WITH RIGHT THORACOTOMY AND RUL RESECTION;  Surgeon: [name], MD;  Location: [facility];  Service: General;  Laterality: Right;   THORACOTOMY Right 08/20/2018   Procedure: THORACOTOMY MAJOR RIGHT UPPER LOBE LOBECTOMY;  Surgeon: [name], MD;  Location: [facility];  Service: Thoracic;  Laterality: Right;   TOE SURGERY Left    pin in left toe    SOCIAL HISTORY: Social History   Socioeconomic History   Marital status: Married    Spouse name: lisa   Number of children: Not on file   Years of education: Not on file   Highest  education level: Not on file  Occupational History   Occupation: welding    Comment: taking time off to  resolve issues  Tobacco Use   Smoking status: Former    Packs/day: 0.50    Years: 15.00    Pack years: 7.50    Types: Cigarettes    Quit date: 08/2018    Years since quitting: 2.8   Smokeless tobacco: Never  Vaping Use   Vaping Use: Never used  Substance and Sexual Activity   Alcohol use: Yes    Alcohol/week: 3.0 standard drinks    Types: 3 Cans of beer per week    Comment: usually 2 drinks per week, per patient   Drug use: No   Sexual activity: Not on file  Other Topics Concern   Not on file  Social History Narrative   Not on file   Social Determinants of Health   Financial Resource Strain: Not on file  Food Insecurity: Not on file  Transportation Needs: Not on file  Physical Activity: Not on file  Stress: Not on file  Social Connections: Not on file  Intimate Partner Violence: Not on file    FAMILY HISTORY: Family History  Problem Relation Age of Onset   Breast cancer Mother    Diabetes Mother    Lung cancer Father    Hypertension Father     ALLERGIES:  has No Known Allergies.  MEDICATIONS:  Current Outpatient Medications  Medication Sig Dispense Refill   albuterol (VENTOLIN HFA) 108 (90 Base) MCG/ACT inhaler INHALE 2 PUFFS BY MOUTH EVERY 6 HOURS AS NEEDED FOR WHEEZE OR SHORTNESS OF BREATH 8.5 g 5   apixaban (ELIQUIS) 5 MG TABS tablet Take 1 tablet (5 mg total) by mouth 2 (two) times daily. 60 tablet 3   budesonide-formoterol (SYMBICORT) 80-4.5 MCG/ACT inhaler Inhale 2 puffs into the lungs 2 (two) times daily. in the morning and at bedtime. 10.2 each 6   diltiazem (CARDIZEM CD) 120 MG 24 hr capsule Take 120 mg by mouth daily.     dronabinol (MARINOL) 5 MG capsule Take 1 capsule (5 mg total) by mouth 2 (two) times daily before a meal. 60 capsule 0   folic acid (V-R FOLIC ACID) 400 MCG tablet Take 1 tablet (400 mcg total) by mouth daily. 90 tablet 1   hydrALAZINE (APRESOLINE) 100 MG tablet Take 50 mg by mouth 2 (two) times daily.     HYDROcodone-acetaminophen  (NORCO/VICODIN) 5-325 MG tablet Take 1 tablet by mouth every 6 (six) hours as needed for severe pain. 10 tablet 0   latanoprost (XALATAN) 0.005 % ophthalmic solution SMARTSIG:In Eye(s)     loratadine (CLARITIN) 10 MG tablet Take 10 mg by mouth daily.     methocarbamol (ROBAXIN) 500 MG tablet Take 1 tablet (500 mg total) by mouth at bedtime. 30 tablet 0   metoprolol succinate (TOPROL-XL) 25 MG 24 hr tablet Take 1 tablet (25 mg total) by mouth daily. 30 tablet 0   nystatin (MYCOSTATIN) 100000 UNIT/ML suspension Take 5 mLs (500,000 Units total) by mouth 4 (four) times daily. Swish and swallow. 473 mL 1   olmesartan (BENICAR) 40 MG tablet Take 40 mg by mouth every other day.      ondansetron (ZOFRAN) 8 MG tablet Take 1 tablet (8 mg total) by mouth every 8 (eight) hours as needed for refractory nausea / vomiting. Start on day 3 after chemo. 90 tablet 1   magnesium chloride (SLOW-MAG) 64 MG TBEC SR tablet Take 1 tablet (64 mg  total) by mouth 2 (two) times daily. TAKE 1 TABLET (64 MG TOTAL) BY MOUTH DAILY. (Patient not taking: Reported on 06/16/2021) 60 tablet 1   pantoprazole (PROTONIX) 20 MG tablet Take 1 tablet (20 mg total) by mouth daily. 60 tablet 2   Potassium Chloride ER 20 MEQ TBCR Take 1 tablet by mouth 2 (two) times daily. 60 tablet 2   promethazine (PHENERGAN) 25 MG tablet Take 1 tablet (25 mg total) by mouth every 6 (six) hours as needed for nausea or vomiting. 30 tablet 0   SPIRIVA HANDIHALER 18 MCG inhalation capsule INHALE 1 CAPSULE VIA HANDIHALER ONCE DAILY AT THE SAME TIME EVERY DAY 30 capsule 0   vitamin B-12 (CYANOCOBALAMIN) 1000 MCG tablet Take 1 tablet (1,000 mcg total) by mouth daily. 90 tablet 1   No current facility-administered medications for this visit.   Facility-Administered Medications Ordered in Other Visits  Medication Dose Route Frequency Provider Last Rate Last Admin   dexamethasone (DECADRON) 10 MG/ML injection            heparin lock flush 100 unit/mL  500 Units  Intravenous Once Earlie Server, MD       sodium chloride flush (NS) 0.9 % injection 10 mL  10 mL Intravenous PRN Earlie Server, MD   10 mL at 04/04/19 0903   sodium chloride flush (NS) 0.9 % injection 10 mL  10 mL Intravenous PRN Earlie Server, MD   10 mL at 07/26/20 1308     PHYSICAL EXAMINATION: ECOG PERFORMANCE STATUS: 1 - Symptomatic but completely ambulatory Vitals:   06/16/21 0832  BP: 137/79  Pulse: 97  Temp: (!) 97.2 F (36.2 C)   Filed Weights   06/16/21 0832  Weight: 131 lb (59.4 kg)    Physical Exam Constitutional:      General: He is not in acute distress. HENT:     Head: Normocephalic and atraumatic.     Mouth/Throat:     Comments: Thrush  Eyes:     General: No scleral icterus. Cardiovascular:     Rate and Rhythm: Normal rate and regular rhythm.     Heart sounds: Normal heart sounds.  Pulmonary:     Effort: Pulmonary effort is normal. No respiratory distress.     Breath sounds: No wheezing.     Comments: Decreased breath sound bilaterally Abdominal:     General: Bowel sounds are normal. There is no distension.     Palpations: Abdomen is soft.  Musculoskeletal:        General: No deformity. Normal range of motion.     Cervical back: Normal range of motion and neck supple.  Skin:    General: Skin is warm and dry.     Findings: No erythema or rash.  Neurological:     Mental Status: He is alert and oriented to person, place, and time. Mental status is at baseline.     Cranial Nerves: No cranial nerve deficit.     Coordination: Coordination normal.  Psychiatric:        Mood and Affect: Mood normal.     LABORATORY DATA:  I have reviewed the data as listed Lab Results  Component Value Date   WBC 14.0 (H) 06/16/2021   HGB 9.1 (L) 06/16/2021   HCT 27.3 (L) 06/16/2021   MCV 103.0 (H) 06/16/2021   PLT 73 (L) 06/16/2021   Recent Labs    08/31/20 0812 09/07/20 1117 05/25/21 0831 06/08/21 0935 06/16/21 0816  NA 137   < > 131* 136  135  K 3.1*   < > 4.0 4.1 3.7   CL 100   < > 96* 101 102  CO2 25   < > 23 26 21*  GLUCOSE 107*   < > 111* 100* 135*  BUN 7   < > _0 CREATININE 0.79   < > 1.00 0.97 0.82  CALCIUM 8.9   < > 8.7* 9.3 9.1  GFRNONAA >60   < > >60 >60 >60  PROT 7.4   < > 8.1 8.7* 7.9  ALBUMIN 3.4*   < > 2.7* 3.2* 3.0*  AST 31   < > _1 ALT 13   < > _2 ALKPHOS 101   < > 90 115 86  BILITOT 2.0*   < > 0.2* 0.7 0.3  BILIDIR 0.4*  --   --   --   --    < > = values in this interval not displayed.    Iron/TIBC/Ferritin/ %Sat No results found for: IRON, TIBC, FERRITIN, IRONPCTSAT   RADIOGRAPHIC STUDIES: I have personally reviewed the radiological images as listed and agreed with the findings in the report. CT CHEST W CONTRAST  Addendum Date: 04/22/2021   ADDENDUM REPORT: 04/22/2021 13:25 ADDENDUM: Critical Value/emergent results were called by telephone at the time of interpretation on 04/22/2021 at 1:24 pm to provider Tunya Held , who verbally acknowledged these results. Electronically Signed   By: Misty Stanley M.D.   On: 04/22/2021 13:25   Result Date: 04/22/2021 CLINICAL DATA:  Lung cancer restaging. EXAM: CT CHEST WITH CONTRAST TECHNIQUE: Multidetector CT imaging of the chest was performed during intravenous contrast administration. CONTRAST:  68m OMNIPAQUE IOHEXOL 350 MG/ML SOLN COMPARISON:  12/15/2020 FINDINGS: Cardiovascular: The heart size is normal. No substantial pericardial effusion. Mild atherosclerotic calcification is noted in the wall of the thoracic aorta. Left Port-A-Cath tip is positioned in the mid to distal SVC. Nonocclusive pulmonary embolus is identified in lobar right middle and lower lobe pulmonary arteries. Thrombus has a adherent quality in some regions suggesting that this may be nonacute. Mediastinum/Nodes: No mediastinal lymphadenopathy. There is no hilar lymphadenopathy. Post treatment changes in the parahilar right lung are similar to prior study performed without intravenous contrast material. The  esophagus has normal imaging features. There is no axillary lymphadenopathy. Lungs/Pleura: Interval progression of consolidative airspace opacity in the medial right middle lobe with bandlike opacity and bronchiectasis in the parahilar right lung. Volume loss right hemithorax is compatible with upper lobectomy. Nodular areas of airspace disease in the posterior right lung are similar to prior. The patient has a new irregular 11 mm nodule in the anterior right lung base with central cavitation (103/3). Multiple new tiny nodules are seen in the right lung base measuring 3-4 mm (see images 89, 97, 101, and 109. Numerous new pulmonary nodules in the left lung are also evident ranging in size from 3 mm up to about 4 mm maximum size (99/3). Tiny right pleural effusion. Upper Abdomen: Unremarkable. Musculoskeletal: No worrisome lytic or sclerotic osseous abnormality. IMPRESSION: 1. Nonocclusive pulmonary embolus in lobar right middle and lower lobe pulmonary arteries. Thrombus appears adherent in some regions, potentially nonacute. 2. Interval progression of consolidative airspace opacity in the medial right middle lobe with bandlike opacity and bronchiectasis in the parahilar right lung. Imaging features are compatible with evolving post treatment change although recurrent disease anteriorly is not excluded. 3. Interval development of numerous bilateral pulmonary nodules, measuring up to 11 mm in  the right lung base. Imaging features are highly concerning for metastatic disease. 4. Tiny right pleural effusion. 5. Aortic Atherosclerosis (ICD10-I70.0). Electronically Signed: By: Misty Stanley M.D. On: 04/22/2021 12:47   MR PROSTATE W WO CONTRAST  Result Date: 04/09/2021 CLINICAL DATA:  Elevated PSA in a 55 year old male also with history of lung cancer. Most recent PSA of 8.2 EXAM: MR PROSTATE WITHOUT AND WITH CONTRAST TECHNIQUE: Multiplanar multisequence MRI images were obtained of the pelvis centered about the  prostate. Pre and post contrast images were obtained. CONTRAST:  88m GADAVIST GADOBUTROL 1 MMOL/ML IV SOLN COMPARISON:  CT chest, abdomen and pelvis of April 2022. FINDINGS: Prostate: Transitional zone: Signs of BPH without visible high-risk lesion. Peripheral zone: Linear and wedge-shaped areas of T2 hypointensity throughout the peripheral zone. Findings of low signal in the peripheral zone at the RIGHT base on T2 slightly more focal, not associated with restricted diffusion. PIRADS category 3 (image 36/13 and image 14/42) Volume:  21 cc PSA density: 0.37 ng/mL Transcapsular spread:  Absent Seminal vesicle involvement: Absent Neurovascular bundle involvement: Absent Pelvic adenopathy: Absent Bone metastasis: Absent Other findings: Sigmoid diverticular disease. IMPRESSION: PIRADS category 3 lesion in the RIGHT base peripheral zone as discussed. Elevated PSA density as discussed above. Findings may warrant close follow-up. Would also correlate with any recent symptoms that would suggest prostatitis. Electronically Signed   By: GZetta BillsM.D.   On: 04/09/2021 15:42      ASSESSMENT & PLAN:  1. Squamous [place] of right lung ([place]   2. Encounter for antineoplastic immunotherapy   3. Other acute pulmonary embolism without acute cor pulmonale ([place]   4. Antineoplastic chemotherapy induced anemia   5. Hypomagnesemia   6. Hypokalemia   7. Thrombocytopenia ([place]    #Recurrent Squamous cell carcinoma of lung, stage IV 07/16/2020-09/28/2020 carboplatin Taxol and Keytruda x 1  partial response -Keytruda maintenance.-04/22/2021, progression-resumed on carboplatin/Taxol/Keytruda - did not tolerate due to myelosuppression.  05/25/2021, patient had carboplatin/Keytruda.   Labs reviewed and discussed with patient.  Hold treatment today.  #Thrombocytopenia.  Hold chemotherapy.  Patient is on Eliquis.  Discussed with patient about increased bleeding risk and he knows that he needs to seek medical advice if  he develops active bleeding events.  #Chemotherapy-induced myelosuppression/anemia/thrombocytopenia.  Consider dose reduction in the next visit..Marland Kitchen  #Weight loss, continue follow-up with nutritionist.  Encouraged nutrition supplementation.  He has gained weight.  #Shortness of breath with exertion.  Combination of COPD/emphysema/lung cancer/symptomatic anemia.. Continue albuterol as needed, Symbicort  #Unprovoked left lower extremity DVT, status post thrombolysis/thrombectomy. Nonocclusive PE noted on recent CT. Continue Eliquis 5 mg twice daily.  #Chronic hypomagnesia.  Continue slow magnesium twice daily.   #Chronic hypokalemia, improved.  Potassium is normal.  Continue potassium chloride 20 mEq twice daily.     return of visit:  Follow-up with me in 1 week lab MD carboplatin, Keytruda.    ZEarlie Server MD, PhD  06/16/2021

## 2021-06-20 ENCOUNTER — Ambulatory Visit: Payer: Medicare Other

## 2021-06-20 ENCOUNTER — Other Ambulatory Visit: Payer: Medicare Other

## 2021-06-20 ENCOUNTER — Ambulatory Visit: Payer: Medicare Other | Admitting: Oncology

## 2021-06-23 ENCOUNTER — Inpatient Hospital Stay (HOSPITAL_BASED_OUTPATIENT_CLINIC_OR_DEPARTMENT_OTHER): Payer: 59 | Admitting: Oncology

## 2021-06-23 ENCOUNTER — Other Ambulatory Visit: Payer: Self-pay

## 2021-06-23 ENCOUNTER — Inpatient Hospital Stay: Payer: 59

## 2021-06-23 ENCOUNTER — Encounter: Payer: Self-pay | Admitting: Oncology

## 2021-06-23 VITALS — BP 152/81 | HR 104 | Temp 96.8°F | Wt 130.6 lb

## 2021-06-23 DIAGNOSIS — C3491 Malignant neoplasm of unspecified part of right bronchus or lung: Secondary | ICD-10-CM

## 2021-06-23 DIAGNOSIS — D6481 Anemia due to antineoplastic chemotherapy: Secondary | ICD-10-CM | POA: Diagnosis not present

## 2021-06-23 DIAGNOSIS — Z5111 Encounter for antineoplastic chemotherapy: Secondary | ICD-10-CM

## 2021-06-23 DIAGNOSIS — Z5112 Encounter for antineoplastic immunotherapy: Secondary | ICD-10-CM | POA: Diagnosis not present

## 2021-06-23 DIAGNOSIS — D696 Thrombocytopenia, unspecified: Secondary | ICD-10-CM

## 2021-06-23 DIAGNOSIS — I2699 Other pulmonary embolism without acute cor pulmonale: Secondary | ICD-10-CM | POA: Diagnosis not present

## 2021-06-23 DIAGNOSIS — T451X5A Adverse effect of antineoplastic and immunosuppressive drugs, initial encounter: Secondary | ICD-10-CM

## 2021-06-23 LAB — CBC WITH DIFFERENTIAL/PLATELET
Abs Immature Granulocytes: 0.03 10*3/uL (ref 0.00–0.07)
Basophils Absolute: 0.1 10*3/uL (ref 0.0–0.1)
Basophils Relative: 1 %
Eosinophils Absolute: 0.4 10*3/uL (ref 0.0–0.5)
Eosinophils Relative: 5 %
HCT: 29.8 % — ABNORMAL LOW (ref 39.0–52.0)
Hemoglobin: 10.2 g/dL — ABNORMAL LOW (ref 13.0–17.0)
Immature Granulocytes: 0 %
Lymphocytes Relative: 13 %
Lymphs Abs: 1.1 10*3/uL (ref 0.7–4.0)
MCH: 35.4 pg — ABNORMAL HIGH (ref 26.0–34.0)
MCHC: 34.2 g/dL (ref 30.0–36.0)
MCV: 103.5 fL — ABNORMAL HIGH (ref 80.0–100.0)
Monocytes Absolute: 0.9 10*3/uL (ref 0.1–1.0)
Monocytes Relative: 10 %
Neutro Abs: 6.2 10*3/uL (ref 1.7–7.7)
Neutrophils Relative %: 71 %
Platelets: 202 10*3/uL (ref 150–400)
RBC: 2.88 MIL/uL — ABNORMAL LOW (ref 4.22–5.81)
RDW: 21 % — ABNORMAL HIGH (ref 11.5–15.5)
WBC: 8.8 10*3/uL (ref 4.0–10.5)
nRBC: 0.2 % (ref 0.0–0.2)

## 2021-06-23 LAB — COMPREHENSIVE METABOLIC PANEL
ALT: 9 U/L (ref 0–44)
AST: 18 U/L (ref 15–41)
Albumin: 3.3 g/dL — ABNORMAL LOW (ref 3.5–5.0)
Alkaline Phosphatase: 80 U/L (ref 38–126)
Anion gap: 13 (ref 5–15)
BUN: 11 mg/dL (ref 6–20)
CO2: 24 mmol/L (ref 22–32)
Calcium: 9.2 mg/dL (ref 8.9–10.3)
Chloride: 100 mmol/L (ref 98–111)
Creatinine, Ser: 0.93 mg/dL (ref 0.61–1.24)
GFR, Estimated: >60 mL/min (ref >60)
Glucose, Bld: 129 mg/dL — ABNORMAL HIGH (ref 70–99)
Potassium: 4 mmol/L (ref 3.5–5.1)
Sodium: 137 mmol/L (ref 135–145)
Total Bilirubin: 0.2 mg/dL — ABNORMAL LOW (ref 0.3–1.2)
Total Protein: 8.2 g/dL — ABNORMAL HIGH (ref 6.5–8.1)

## 2021-06-23 MED ORDER — SODIUM CHLORIDE 0.9 % IV SOLN
531.0000 mg | Freq: Once | INTRAVENOUS | Status: AC
  Administered 2021-06-23: 530 mg via INTRAVENOUS
  Filled 2021-06-23: qty 53

## 2021-06-23 MED ORDER — SODIUM CHLORIDE 0.9 % IV SOLN
10.0000 mg | Freq: Once | INTRAVENOUS | Status: AC
  Administered 2021-06-23: 10 mg via INTRAVENOUS
  Filled 2021-06-23: qty 10

## 2021-06-23 MED ORDER — DIPHENHYDRAMINE HCL 50 MG/ML IJ SOLN: 50.0000 mg | Freq: Once | INTRAMUSCULAR | Status: DC

## 2021-06-23 MED ORDER — SODIUM CHLORIDE 0.9 % IV SOLN
150.0000 mg | Freq: Once | INTRAVENOUS | Status: AC
  Administered 2021-06-23: 150 mg via INTRAVENOUS
  Filled 2021-06-23: qty 150

## 2021-06-23 MED ORDER — FAMOTIDINE 20 MG IN NS 100 ML IVPB: 20.0000 mg | Freq: Once | INTRAVENOUS | Status: DC

## 2021-06-23 MED ORDER — SODIUM CHLORIDE 0.9 % IV SOLN
200.0000 mg | Freq: Once | INTRAVENOUS | Status: AC
  Administered 2021-06-23: 200 mg via INTRAVENOUS
  Filled 2021-06-23: qty 8

## 2021-06-23 MED ORDER — HEPARIN SOD (PORK) LOCK FLUSH 100 UNIT/ML IV SOLN
500.0000 [IU] | Freq: Once | INTRAVENOUS | Status: DC
  Filled 2021-06-23: qty 5

## 2021-06-23 MED ORDER — HEPARIN SOD (PORK) LOCK FLUSH 100 UNIT/ML IV SOLN
INTRAVENOUS | Status: AC
  Filled 2021-06-23: qty 5

## 2021-06-23 MED ORDER — SODIUM CHLORIDE 0.9% FLUSH
10.0000 mL | INTRAVENOUS | Status: DC | PRN
  Administered 2021-06-23: 10 mL via INTRAVENOUS
  Filled 2021-06-23: qty 10

## 2021-06-23 MED ORDER — HEPARIN SOD (PORK) LOCK FLUSH 100 UNIT/ML IV SOLN
500.0000 [IU] | Freq: Once | INTRAVENOUS | Status: AC | PRN
  Administered 2021-06-23: 500 [IU]
  Filled 2021-06-23: qty 5

## 2021-06-23 MED ORDER — SODIUM CHLORIDE 0.9 % IV SOLN
Freq: Once | INTRAVENOUS | Status: AC
  Filled 2021-06-23: qty 250

## 2021-06-23 MED ORDER — PALONOSETRON HCL INJECTION 0.25 MG/5ML
0.2500 mg | Freq: Once | INTRAVENOUS | Status: AC
  Administered 2021-06-23: 0.25 mg via INTRAVENOUS
  Filled 2021-06-23: qty 5

## 2021-06-23 NOTE — Patient Instructions (Signed)
[facility] AT [place]  Discharge Instructions: Thank you for choosing [facility] to provide your oncology and hematology care.  If you have a lab appointment with the [facility], please go directly to the [facility] and check in at the registration area.  Wear comfortable clothing and clothing appropriate for easy access to any Portacath or PICC line.   We strive to give you quality time with your provider. You may need to reschedule your appointment if you arrive late (15 or more minutes).  Arriving late affects you and other patients whose appointments are after yours.  Also, if you miss three or more appointments without notifying the office, you may be dismissed from the clinic at the providers discretion.      For prescription refill requests, have your pharmacy contact our office and allow 72 hours for refills to be completed.    Today you received the following chemotherapy and/or immunotherapy agents: Keytruda, Carboplatin      To help prevent nausea and vomiting after your treatment, we encourage you to take your nausea medication as directed.  BELOW ARE SYMPTOMS THAT SHOULD BE REPORTED IMMEDIATELY: *FEVER GREATER THAN 100.4 F (38 C) OR HIGHER *CHILLS OR SWEATING *NAUSEA AND VOMITING THAT IS NOT CONTROLLED WITH YOUR NAUSEA MEDICATION *UNUSUAL SHORTNESS OF BREATH *UNUSUAL BRUISING OR BLEEDING *URINARY PROBLEMS (pain or burning when urinating, or frequent urination) *BOWEL PROBLEMS (unusual diarrhea, constipation, pain near the anus) TENDERNESS IN MOUTH AND THROAT WITH OR WITHOUT PRESENCE OF ULCERS (sore throat, sores in mouth, or a toothache) UNUSUAL RASH, SWELLING OR PAIN  UNUSUAL VAGINAL DISCHARGE OR ITCHING   Items with * indicate a potential emergency and should be followed up as soon as possible or go to the Emergency Department if any problems should occur.  Please show the CHEMOTHERAPY ALERT CARD or IMMUNOTHERAPY ALERT CARD at  check-in to the Emergency Department and triage nurse.  Should you have questions after your visit or need to cancel or reschedule your appointment, please contact [facility] [place] [facility] [place]  [phone] and follow the prompts.  Office hours are 8:00 a.m. to 4:30 p.m. Monday - Friday. Please note that voicemails left after 4:00 p.m. may not be returned until the following business day.  We are closed weekends and major holidays. You have access to a nurse at all times for urgent questions. Please call the main number to the clinic [phone] and follow the prompts.  For any non-urgent questions, you may also contact your provider using MyChart. We now offer e-Visits for anyone 30 and older to request care online for non-urgent symptoms. For details visit [email or web address].   Also download the MyChart app! Go to the app store, search "MyChart", open the app, select [facility], and log in with your MyChart username and password.  Due to Covid, a mask is required upon entering the hospital/clinic. If you do not have a mask, one will be given to you upon arrival. For doctor visits, patients may have 1 support person aged 44 or older with them. For treatment visits, patients cannot have anyone with them due to current Covid guidelines and our immunocompromised population. Carboplatin injection What is this medication? CARBOPLATIN (KAR boe pla tin) is a chemotherapy drug. It targets fast dividing cells, like cancer cells, and causes these cells to die. This medicine is used to treat ovarian cancer and many other cancers. This medicine may be used for other purposes; ask your health care provider or pharmacist  if you have questions. COMMON BRAND NAME(S): Paraplatin What should I tell my care team before I take this medication? They need to know if you have any of these conditions: blood disorders hearing problems kidney disease recent or ongoing radiation therapy an unusual or  allergic reaction to carboplatin, cisplatin, other chemotherapy, other medicines, foods, dyes, or preservatives pregnant or trying to get pregnant breast-feeding How should I use this medication? This drug is usually given as an infusion into a vein. It is administered in a hospital or clinic by a specially trained health care professional. Talk to your pediatrician regarding the use of this medicine in children. Special care may be needed. Overdosage: If you think you have taken too much of this medicine contact a poison control center or emergency room at once. NOTE: This medicine is only for you. Do not share this medicine with others. What if I miss a dose? It is important not to miss a dose. Call your doctor or health care professional if you are unable to keep an appointment. What may interact with this medication? medicines for seizures medicines to increase blood counts like filgrastim, pegfilgrastim, sargramostim some antibiotics like amikacin, gentamicin, neomycin, streptomycin, tobramycin vaccines Talk to your doctor or health care professional before taking any of these medicines: acetaminophen aspirin ibuprofen ketoprofen naproxen This list may not describe all possible interactions. Give your health care provider a list of all the medicines, herbs, non-prescription drugs, or dietary supplements you use. Also tell them if you smoke, drink alcohol, or use illegal drugs. Some items may interact with your medicine. What should I watch for while using this medication? Your condition will be monitored carefully while you are receiving this medicine. You will need important blood work done while you are taking this medicine. This drug may make you feel generally unwell. This is not uncommon, as chemotherapy can affect healthy cells as well as cancer cells. Report any side effects. Continue your course of treatment even though you feel ill unless your doctor tells you to stop. In some  cases, you may be given additional medicines to help with side effects. Follow all directions for their use. Call your doctor or health care professional for advice if you get a fever, chills or sore throat, or other symptoms of a cold or flu. Do not treat yourself. This drug decreases your body's ability to fight infections. Try to avoid being around people who are sick. This medicine may increase your risk to bruise or bleed. Call your doctor or health care professional if you notice any unusual bleeding. Be careful brushing and flossing your teeth or using a toothpick because you may get an infection or bleed more easily. If you have any dental work done, tell your dentist you are receiving this medicine. Avoid taking products that contain aspirin, acetaminophen, ibuprofen, naproxen, or ketoprofen unless instructed by your doctor. These medicines may hide a fever. Do not become pregnant while taking this medicine. Women should inform their doctor if they wish to become pregnant or think they might be pregnant. There is a potential for serious side effects to an unborn child. Talk to your health care professional or pharmacist for more information. Do not breast-feed an infant while taking this medicine. What side effects may I notice from receiving this medication? Side effects that you should report to your doctor or health care professional as soon as possible: allergic reactions like skin rash, itching or hives, swelling of the face, lips, or tongue signs  of infection - fever or chills, cough, sore throat, pain or difficulty passing urine signs of decreased platelets or bleeding - bruising, pinpoint red spots on the skin, black, tarry stools, nosebleeds signs of decreased red blood cells - unusually weak or tired, fainting spells, lightheadedness breathing problems changes in hearing changes in vision chest pain high blood pressure low blood counts - This drug may decrease the number of white  blood cells, red blood cells and platelets. You may be at increased risk for infections and bleeding. nausea and vomiting pain, swelling, redness or irritation at the injection site pain, tingling, numbness in the hands or feet problems with balance, talking, walking trouble passing urine or change in the amount of urine Side effects that usually do not require medical attention (report to your doctor or health care professional if they continue or are bothersome): hair loss loss of appetite metallic taste in the mouth or changes in taste This list may not describe all possible side effects. Call your doctor for medical advice about side effects. You may report side effects to FDA at 1-800-FDA-1088. Where should I keep my medication? This drug is given in a hospital or clinic and will not be stored at home. NOTE: This sheet is a summary. It may not cover all possible information. If you have questions about this medicine, talk to your doctor, pharmacist, or health care provider.  2022 Elsevier/Gold Standard (2007-12-04 00:00:00) Pembrolizumab injection What is this medication? PEMBROLIZUMAB (pem broe liz ue mab) is a monoclonal antibody. It is used to treat certain types of cancer. This medicine may be used for other purposes; ask your health care provider or pharmacist if you have questions. COMMON BRAND NAME(S): Keytruda What should I tell my care team before I take this medication? They need to know if you have any of these conditions: autoimmune diseases like Crohn's disease, ulcerative colitis, or lupus have had or planning to have an allogeneic stem cell transplant (uses someone else's stem cells) history of organ transplant history of chest radiation nervous system problems like myasthenia gravis or Guillain-Barre syndrome an unusual or allergic reaction to pembrolizumab, other medicines, foods, dyes, or preservatives pregnant or trying to get pregnant breast-feeding How should  I use this medication? This medicine is for infusion into a vein. It is given by a health care professional in a hospital or clinic setting. A special MedGuide will be given to you before each treatment. Be sure to read this information carefully each time. Talk to your pediatrician regarding the use of this medicine in children. While this drug may be prescribed for children as young as 6 months for selected conditions, precautions do apply. Overdosage: If you think you have taken too much of this medicine contact a poison control center or emergency room at once. NOTE: This medicine is only for you. Do not share this medicine with others. What if I miss a dose? It is important not to miss your dose. Call your doctor or health care professional if you are unable to keep an appointment. What may interact with this medication? Interactions have not been studied. This list may not describe all possible interactions. Give your health care provider a list of all the medicines, herbs, non-prescription drugs, or dietary supplements you use. Also tell them if you smoke, drink alcohol, or use illegal drugs. Some items may interact with your medicine. What should I watch for while using this medication? Your condition will be monitored carefully while you are receiving this  medicine. You may need blood work done while you are taking this medicine. Do not become pregnant while taking this medicine or for 4 months after stopping it. Women should inform their doctor if they wish to become pregnant or think they might be pregnant. There is a potential for serious side effects to an unborn child. Talk to your health care professional or pharmacist for more information. Do not breast-feed an infant while taking this medicine or for 4 months after the last dose. What side effects may I notice from receiving this medication? Side effects that you should report to your doctor or health care professional as soon as  possible: allergic reactions like skin rash, itching or hives, swelling of the face, lips, or tongue bloody or black, tarry breathing problems changes in vision chest pain chills confusion constipation cough diarrhea dizziness or feeling faint or lightheaded fast or irregular heartbeat fever flushing joint pain low blood counts - this medicine may decrease the number of white blood cells, red blood cells and platelets. You may be at increased risk for infections and bleeding. muscle pain muscle weakness pain, tingling, numbness in the hands or feet persistent headache redness, blistering, peeling or loosening of the skin, including inside the mouth signs and symptoms of high blood sugar such as dizziness; dry mouth; dry skin; fruity breath; nausea; stomach pain; increased hunger or thirst; increased urination signs and symptoms of kidney injury like trouble passing urine or change in the amount of urine signs and symptoms of liver injury like dark urine, light-colored stools, loss of appetite, nausea, right upper belly pain, yellowing of the eyes or skin sweating swollen lymph nodes weight loss Side effects that usually do not require medical attention (report to your doctor or health care professional if they continue or are bothersome): decreased appetite hair loss tiredness This list may not describe all possible side effects. Call your doctor for medical advice about side effects. You may report side effects to FDA at 1-800-FDA-1088. Where should I keep my medication? This drug is given in a hospital or clinic and will not be stored at home. NOTE: This sheet is a summary. It may not cover all possible information. If you have questions about this medicine, talk to your doctor, pharmacist, or health care provider.  2022 Elsevier/Gold Standard (2021-03-15 00:00:00)

## 2021-06-23 NOTE — Progress Notes (Signed)
Hematology/Oncology Follow up note [phone] [phone]   Patient Care Team: Tracie Harrier, MD as PCP - General (Internal Medicine) Earlie Server, MD as Consulting Physician (Hematology and Oncology)  REASON FOR VISIT:  Follow up for treatment of squamous lung cancer.   HISTORY OF PRESENTING ILLNESS:  # Dec 2019 Stage I squamous lung cancer #s/p Bronchoscopy on 06/25/2018. subcarina EBUS FNA was non diagnostic, hypocellular specimen.  # 08/13/2018 CT guided right upper lobe biopsy pathology showed dense fibrosis and mixed inflammatory cells with prominent polytypic plasma cells component. Focal benign bronchial wall and alveolar spaces. No malignancy was identified.   # 08/13/2018 underwent right thoracotomy and wedge resection of a right upper lobe mass.  Frozen section was consistent with an inflammatory process.  On the second postop day, preliminary pathology reports high-grade malignancy.  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  08/20/2018 Patient was therefore taken back to the OR and underwent take complete lobectomy  pT1b pN0 cM0 stage I squamous cell lung cancer.  Margin is negative.  Recommend observation.    # 12/03/2018 CT chest w contrast showed local recurrence.  12/10/2018 PET showed No evidence of distant metastatic disease # 12/26/2018 s/p bronchoscopy biopsy. Confirmed local recurrence of squamous cell lung cancer.  medi port placed by Dr.Oaks.  # 06/14/2020 CT chest w contrast was reviewed and discussed with patient.  Evidence of progressive lymphangitic spread of tumor throughout the right lung, with contralateral lung nodules with  right pleural effusion  MRI brain is negative for CNS involvement.  CT abdomen with contrast showed no evidence of abdominal metastatic disease PET scan was not approved by insurance-peer to peer appeal with Dr.Vipul Bhanderi   # NGS - KRAS V14L,  TMB 26.6-high, MS stable, PD-L1 5% CDKN2C G48, DICER1c.2117-1G>T,  FLT4 G1131S, NF1 A443f, NF1 L2643*, STK11 N1850fCancer treatment Started concurrent chemoradiation on 01/16/2019 Carboplatin AUC of 2 and Taxol 45 mg/m2 weekly finished in 03/05/2019 04/10/2019, patient started on durvalumab maintenance. -CT showed progression.  Insurance denied PET scan evaluation. 07/16/2020-09/28/2020 4 cycles of carboplatin/paclitaxel/Keytruda  10/15/2020, CT chest abdomen pelvis redemonstrated postoperative and postradiation appearance of the right chest with perihilar consolidation and fibrosis.  Slight interval decrease in size of the pulmonary nodules associated with interlobular septal thickening nearly complete resolution of previously seen left-sided pulmonary nodules.  Minimal irregular residual.  Right pleural effusion improved.  Consistent with treatment response.  No evidence of metastatic disease with the abdomen or pelvis.  None obstructive bilateral nephrolithiasis - partial response to the treatment- 10/19/2020, Keytruda monotherapy maintenance  12/15/2020, CT chest without contrast showed resolution of lung nodules, no evidence of recurrent disease 12/30/2020, patient was admitted due to unprovoked extensive deep vein thrombosis from calf vein to the common femoral vein on the left.  Patient was started on heparin drip.  Patient underwent thrombolysis and is a colectomy on 12/31/2020.  Anticoagulation was switched to Eliquis at discharge.  04/22/2021, CT chest with contrast showed none occlusive pulmonary embolism, Interval progression of consolidation airspace opacity in the medial right middle lobe with bandlike opacity and bronchiectasis in the perihilar right lung,-this is compatible with evolving posttreatment changes although recurrent disease anteriorly is not excluded. Interval development of numerous bilateral pulmonary nodules, measuring up to 11 mm in the right lung base.  Concerning for metastatic disease.  Tiny right pleural effusion.   04/26/2021, resumed on  carboplatin/Taxol/Keytruda. Patient did not tolerate his chemotherapy of carboplatin/Taxol/Keytruda on 04/26/2021. Patient has developed severe anemia status post PRBC transfusion.  05/25/2021, single agent carboplatin AUC 5 with Keytruda   INTERVAL HISTORY Samuel Choi is a 55 y.o. male who has above history reviewed by me today presents for reatment of  recurrent  squamous cell carcinoma. Patient did not tolerate carboplatin Taxol Keytruda due to marrow suppression.  He had 1 round of single agent carboplatin AUC 5 with Keytruda.  Due to thrombocytopenia, chemotherapy was held. Today he reports feeling better.  He has gained some weight.  No nausea vomiting diarrhea.     Review of Systems  Constitutional:  Positive for fatigue and unexpected weight change. Negative for appetite change, chills and fever.  HENT:   Negative for hearing loss and voice change.   Eyes:  Negative for eye problems and icterus.  Respiratory:  Positive for shortness of breath. Negative for chest tightness and cough.   Cardiovascular:  Negative for chest pain and leg swelling.  Gastrointestinal:  Negative for abdominal distention, abdominal pain, nausea and vomiting.  Endocrine: Negative for hot flashes.  Genitourinary:  Negative for difficulty urinating, dysuria and frequency.   Musculoskeletal:  Negative for arthralgias.  Skin:  Negative for itching and rash.  Neurological:  Negative for light-headedness and numbness.  Hematological:  Negative for adenopathy. Does not bruise/bleed easily.  Psychiatric/Behavioral:  Negative for confusion.    MEDICAL HISTORY:  Past Medical History:  Diagnosis Date   Alcohol abuse    usually drinks 2-3 drinks per day   Atherosclerosis 06/2018   Chronic sinusitis    Dehydration 02/07/2019   Emphysema of lung ([place]) 06/2018   patient unaware of this.   Hip fracture ([place]) 05/2018   no surgery   History of kidney stones 05/2018   per xray, bilateral nephrolitiasis    Hypertension    Squamous cell carcinoma of lung, right ([place]) 06/2018    SURGICAL HISTORY: Past Surgical History:  Procedure Laterality Date   BRAIN SURGERY  10/2017   nasal/sinus endoscopy. mass benign   ELECTROMAGNETIC NAVIGATION BROCHOSCOPY Right 06/25/2018   Procedure: ELECTROMAGNETIC NAVIGATION BRONCHOSCOPY;  Surgeon: [name], MD;  Location: [facility];  Service: Cardiopulmonary;  Laterality: Right;   ENDOBRONCHIAL ULTRASOUND Right 06/25/2018   Procedure: ENDOBRONCHIAL ULTRASOUND;  Surgeon: [name], MD;  Location: [facility];  Service: Cardiopulmonary;  Laterality: Right;   ENDOBRONCHIAL ULTRASOUND Right 12/26/2018   Procedure: ENDOBRONCHIAL ULTRASOUND RIGHT;  Surgeon: [name], MD;  Location: [facility];  Service: Cardiopulmonary;  Laterality: Right;   FLEXIBLE BRONCHOSCOPY N/A 08/20/2018   Procedure: FLEXIBLE BRONCHOSCOPY PREOP;  Surgeon: [name], MD;  Location: [facility];  Service: Thoracic;  Laterality: N/A;   IR CV LINE INJECTION  04/04/2019   NASAL SINUS SURGERY  10/2017   At [facility], frontal sinusotomy, ethmoidectomy, resection anterior cranial fossa neoplasm, turbinate resection   PERIPHERAL VASCULAR THROMBECTOMY Left 12/31/2020   Procedure: PERIPHERAL VASCULAR THROMBECTOMY;  Surgeon: Algernon Huxley, MD;  Location: [facility];  Service: Cardiovascular;  Laterality: Left;   PORTACATH PLACEMENT Left 01/15/2019   Procedure: INSERTION PORT-A-CATH;  Surgeon: [name], MD;  Location: [facility];  Service: General;  Laterality: Left;   THORACOTOMY Right 08/13/2018   Procedure: PREOP BROCHOSCOPY WITH RIGHT THORACOTOMY AND RUL RESECTION;  Surgeon: [name], MD;  Location: [facility];  Service: General;  Laterality: Right;   THORACOTOMY Right 08/20/2018   Procedure: THORACOTOMY MAJOR RIGHT UPPER LOBE LOBECTOMY;  Surgeon: [name], MD;  Location: [facility];  Service: Thoracic;  Laterality: Right;   TOE SURGERY Left    pin in left  toe    SOCIAL HISTORY: Social  History   Socioeconomic History   Marital status: Married    Spouse name: lisa   Number of children: Not on file   Years of education: Not on file   Highest education level: Not on file  Occupational History   Occupation: welding    Comment: taking time off to resolve issues  Tobacco Use   Smoking status: Former    Packs/day: 0.50    Years: 15.00    Pack years: 7.50    Types: Cigarettes    Quit date: 08/2018    Years since quitting: 2.8   Smokeless tobacco: Never  Vaping Use   Vaping Use: Never used  Substance and Sexual Activity   Alcohol use: Yes    Alcohol/week: 3.0 standard drinks    Types: 3 Cans of beer per week    Comment: usually 2 drinks per week, per patient   Drug use: No   Sexual activity: Not on file  Other Topics Concern   Not on file  Social History Narrative   Not on file   Social Determinants of Health   Financial Resource Strain: Not on file  Food Insecurity: Not on file  Transportation Needs: Not on file  Physical Activity: Not on file  Stress: Not on file  Social Connections: Not on file  Intimate Partner Violence: Not on file    FAMILY HISTORY: Family History  Problem Relation Age of Onset   Breast cancer Mother    Diabetes Mother    Lung cancer Father    Hypertension Father     ALLERGIES:  has No Known Allergies.  MEDICATIONS:  Current Outpatient Medications  Medication Sig Dispense Refill   albuterol (VENTOLIN HFA) 108 (90 Base) MCG/ACT inhaler INHALE 2 PUFFS BY MOUTH EVERY 6 HOURS AS NEEDED FOR WHEEZE OR SHORTNESS OF BREATH 8.5 g 5   apixaban (ELIQUIS) 5 MG TABS tablet Take 1 tablet (5 mg total) by mouth 2 (two) times daily. 60 tablet 3   budesonide-formoterol (SYMBICORT) 80-4.5 MCG/ACT inhaler Inhale 2 puffs into the lungs 2 (two) times daily. in the morning and at bedtime. 10.2 each 6   diltiazem (CARDIZEM CD) 120 MG 24 hr capsule Take 120 mg by mouth daily.     dronabinol (MARINOL) 5 MG capsule Take 1 capsule (5 mg total) by  mouth 2 (two) times daily before a meal. 60 capsule 0   folic acid (V-R FOLIC ACID) 220 MCG tablet Take 1 tablet (400 mcg total) by mouth daily. 90 tablet 1   hydrALAZINE (APRESOLINE) 100 MG tablet Take 50 mg by mouth 2 (two) times daily.     HYDROcodone-acetaminophen (NORCO/VICODIN) 5-325 MG tablet Take 1 tablet by mouth every 6 (six) hours as needed for severe pain. 10 tablet 0   latanoprost (XALATAN) 0.005 % ophthalmic solution SMARTSIG:In Eye(s)     loratadine (CLARITIN) 10 MG tablet Take 10 mg by mouth daily.     methocarbamol (ROBAXIN) 500 MG tablet Take 1 tablet (500 mg total) by mouth at bedtime. 30 tablet 0   metoprolol succinate (TOPROL-XL) 25 MG 24 hr tablet Take 1 tablet (25 mg total) by mouth daily. 30 tablet 0   nystatin (MYCOSTATIN) 100000 UNIT/ML suspension Take 5 mLs (500,000 Units total) by mouth 4 (four) times daily. Swish and swallow. 473 mL 1   olmesartan (BENICAR) 40 MG tablet Take 40 mg by mouth every other day.      ondansetron (ZOFRAN) 8 MG tablet Take  1 tablet (8 mg total) by mouth every 8 (eight) hours as needed for refractory nausea / vomiting. Start on day 3 after chemo. 90 tablet 1   Potassium Chloride ER 20 MEQ TBCR Take 1 tablet by mouth 2 (two) times daily. 60 tablet 2   promethazine (PHENERGAN) 25 MG tablet Take 1 tablet (25 mg total) by mouth every 6 (six) hours as needed for nausea or vomiting. 30 tablet 0   SPIRIVA HANDIHALER 18 MCG inhalation capsule INHALE 1 CAPSULE VIA HANDIHALER ONCE DAILY AT THE SAME TIME EVERY DAY 30 capsule 0   vitamin B-12 (CYANOCOBALAMIN) 1000 MCG tablet Take 1 tablet (1,000 mcg total) by mouth daily. 90 tablet 1   magnesium chloride (SLOW-MAG) 64 MG TBEC SR tablet Take 1 tablet (64 mg total) by mouth 2 (two) times daily. TAKE 1 TABLET (64 MG TOTAL) BY MOUTH DAILY. (Patient not taking: Reported on 06/16/2021) 60 tablet 1   pantoprazole (PROTONIX) 20 MG tablet Take 1 tablet (20 mg total) by mouth daily. 60 tablet 2   No current  facility-administered medications for this visit.   Facility-Administered Medications Ordered in Other Visits  Medication Dose Route Frequency Provider Last Rate Last Admin   dexamethasone (DECADRON) 10 MG/ML injection            heparin lock flush 100 UNIT/ML injection            heparin lock flush 100 unit/mL  500 Units Intravenous Once Earlie Server, MD       sodium chloride flush (NS) 0.9 % injection 10 mL  10 mL Intravenous PRN Earlie Server, MD   10 mL at 04/04/19 0903   sodium chloride flush (NS) 0.9 % injection 10 mL  10 mL Intravenous PRN Earlie Server, MD   10 mL at 07/26/20 1308     PHYSICAL EXAMINATION: ECOG PERFORMANCE STATUS: 1 - Symptomatic but completely ambulatory Vitals:   06/23/21 0836  BP: (!) 152/81  Pulse: (!) 104  Temp: (!) 96.8 F (36 C)   Filed Weights   06/23/21 0836  Weight: 130 lb 9.6 oz (59.2 kg)    Physical Exam Constitutional:      General: He is not in acute distress. HENT:     Head: Normocephalic and atraumatic.     Mouth/Throat:     Comments:   Eyes:     General: No scleral icterus. Cardiovascular:     Rate and Rhythm: Normal rate and regular rhythm.     Heart sounds: Normal heart sounds.  Pulmonary:     Effort: Pulmonary effort is normal. No respiratory distress.     Breath sounds: No wheezing.     Comments: Decreased breath sound bilaterally Abdominal:     General: Bowel sounds are normal. There is no distension.     Palpations: Abdomen is soft.  Musculoskeletal:        General: No deformity. Normal range of motion.     Cervical back: Normal range of motion and neck supple.  Skin:    General: Skin is warm and dry.     Findings: No erythema or rash.  Neurological:     Mental Status: He is alert and oriented to person, place, and time. Mental status is at baseline.     Cranial Nerves: No cranial nerve deficit.     Coordination: Coordination normal.  Psychiatric:        Mood and Affect: Mood normal.     LABORATORY DATA:  I have reviewed  the data  as listed Lab Results  Component Value Date   WBC 8.8 06/23/2021   HGB 10.2 (L) 06/23/2021   HCT 29.8 (L) 06/23/2021   MCV 103.5 (H) 06/23/2021   PLT 202 06/23/2021   Recent Labs    08/31/20 0812 09/07/20 1117 06/08/21 0935 06/16/21 0816 06/23/21 0814  NA 137   < > 136 135 137  K 3.1*   < > 4.1 3.7 4.0  CL 100   < > 101 102 100  CO2 25   < > 26 21* 24  GLUCOSE 107*   < > 100* 135* 129*  BUN 7   < > _0 CREATININE 0.79   < > 0.97 0.82 0.93  CALCIUM 8.9   < > 9.3 9.1 9.2  GFRNONAA >60   < > >60 >60 >60  PROT 7.4   < > 8.7* 7.9 8.2*  ALBUMIN 3.4*   < > 3.2* 3.0* 3.3*  AST 31   < > _1 ALT 13   < > _2 ALKPHOS 101   < > 115 86 80  BILITOT 2.0*   < > 0.7 0.3 0.2*  BILIDIR 0.4*  --   --   --   --    < > = values in this interval not displayed.    Iron/TIBC/Ferritin/ %Sat No results found for: IRON, TIBC, FERRITIN, IRONPCTSAT   RADIOGRAPHIC STUDIES: I have personally reviewed the radiological images as listed and agreed with the findings in the report. CT CHEST W CONTRAST  Addendum Date: 04/22/2021   ADDENDUM REPORT: 04/22/2021 13:25 ADDENDUM: Critical Value/emergent results were called by telephone at the time of interpretation on 04/22/2021 at 1:24 pm to provider Foch Rosenwald , who verbally acknowledged these results. Electronically Signed   By: Misty Stanley M.D.   On: 04/22/2021 13:25   Result Date: 04/22/2021 CLINICAL DATA:  Lung cancer restaging. EXAM: CT CHEST WITH CONTRAST TECHNIQUE: Multidetector CT imaging of the chest was performed during intravenous contrast administration. CONTRAST:  17m OMNIPAQUE IOHEXOL 350 MG/ML SOLN COMPARISON:  12/15/2020 FINDINGS: Cardiovascular: The heart size is normal. No substantial pericardial effusion. Mild atherosclerotic calcification is noted in the wall of the thoracic aorta. Left Port-A-Cath tip is positioned in the mid to distal SVC. Nonocclusive pulmonary embolus is identified in lobar right middle and lower  lobe pulmonary arteries. Thrombus has a adherent quality in some regions suggesting that this may be nonacute. Mediastinum/Nodes: No mediastinal lymphadenopathy. There is no hilar lymphadenopathy. Post treatment changes in the parahilar right lung are similar to prior study performed without intravenous contrast material. The esophagus has normal imaging features. There is no axillary lymphadenopathy. Lungs/Pleura: Interval progression of consolidative airspace opacity in the medial right middle lobe with bandlike opacity and bronchiectasis in the parahilar right lung. Volume loss right hemithorax is compatible with upper lobectomy. Nodular areas of airspace disease in the posterior right lung are similar to prior. The patient has a new irregular 11 mm nodule in the anterior right lung base with central cavitation (103/3). Multiple new tiny nodules are seen in the right lung base measuring 3-4 mm (see images 89, 97, 101, and 109. Numerous new pulmonary nodules in the left lung are also evident ranging in size from 3 mm up to about 4 mm maximum size (99/3). Tiny right pleural effusion. Upper Abdomen: Unremarkable. Musculoskeletal: No worrisome lytic or sclerotic osseous abnormality. IMPRESSION: 1. Nonocclusive pulmonary embolus in lobar right middle and lower lobe pulmonary arteries.  Thrombus appears adherent in some regions, potentially nonacute. 2. Interval progression of consolidative airspace opacity in the medial right middle lobe with bandlike opacity and bronchiectasis in the parahilar right lung. Imaging features are compatible with evolving post treatment change although recurrent disease anteriorly is not excluded. 3. Interval development of numerous bilateral pulmonary nodules, measuring up to 11 mm in the right lung base. Imaging features are highly concerning for metastatic disease. 4. Tiny right pleural effusion. 5. Aortic Atherosclerosis (ICD10-I70.0). Electronically Signed: By: Misty Stanley M.D. On:  04/22/2021 12:47   MR PROSTATE W WO CONTRAST  Result Date: 04/09/2021 CLINICAL DATA:  Elevated PSA in a 55 year old male also with history of lung cancer. Most recent PSA of 8.2 EXAM: MR PROSTATE WITHOUT AND WITH CONTRAST TECHNIQUE: Multiplanar multisequence MRI images were obtained of the pelvis centered about the prostate. Pre and post contrast images were obtained. CONTRAST:  30m GADAVIST GADOBUTROL 1 MMOL/ML IV SOLN COMPARISON:  CT chest, abdomen and pelvis of April 2022. FINDINGS: Prostate: Transitional zone: Signs of BPH without visible high-risk lesion. Peripheral zone: Linear and wedge-shaped areas of T2 hypointensity throughout the peripheral zone. Findings of low signal in the peripheral zone at the RIGHT base on T2 slightly more focal, not associated with restricted diffusion. PIRADS category 3 (image 36/13 and image 14/42) Volume:  21 cc PSA density: 0.37 ng/mL Transcapsular spread:  Absent Seminal vesicle involvement: Absent Neurovascular bundle involvement: Absent Pelvic adenopathy: Absent Bone metastasis: Absent Other findings: Sigmoid diverticular disease. IMPRESSION: PIRADS category 3 lesion in the RIGHT base peripheral zone as discussed. Elevated PSA density as discussed above. Findings may warrant close follow-up. Would also correlate with any recent symptoms that would suggest prostatitis. Electronically Signed   By: GZetta BillsM.D.   On: 04/09/2021 15:42      ASSESSMENT & PLAN:  1. [place] of right lung ([place]   2. Encounter for antineoplastic immunotherapy   3. Encounter for antineoplastic chemotherapy   4. Other acute pulmonary embolism without acute cor pulmonale ([place]   5. Antineoplastic chemotherapy induced anemia   6. Hypomagnesemia   7. [place]rombocytopenia ([place]    #Recurrent Squamous cell carcinoma of lung, stage IV 07/16/2020-09/28/2020 carboplatin Taxol and Keytruda x 1  partial response -Keytruda maintenance.-04/22/2021, progression-resumed on  carboplatin/Taxol/Keytruda - did not tolerate due to myelosuppression.  05/25/2021, patient had carboplatin/Keytruda.-Developed thrombocytopenia. Labs reviewed and discussed with patient. Counts acceptable to proceed with carboplatin AUC 5/Keytruda. Repeat CT in January 2022 for evaluation of treatment response.  #Thrombocytopenia.  Resolved.  Chemotherapy-induced.  #Weight loss, continue follow-up with nutritionist.  Encouraged nutrition supplementation.  He has gained weight.  #Shortness of breath with exertion.  Combination of COPD/emphysema/lung cancer/symptomatic anemia.. Continue albuterol as needed, Symbicort.  Symptoms are stable.  #Unprovoked left lower extremity DVT, status post thrombolysis/thrombectomy. Nonocclusive PE noted on recent CT. Continue Eliquis 5 mg twice daily.  #Chronic hypomagnesia.  Continue slow magnesium twice daily.   #Chronic hypokalemia, improved.  Potassium is normal.  Continue potassium chloride 20 mEq twice daily.     return of visit:  Follow-up with me in 3 weeks lab MD carboplatin, Keytruda.   CT in January 2022  ZEarlie Server MD, PhD  06/23/2021

## 2021-06-27 ENCOUNTER — Other Ambulatory Visit: Payer: Self-pay

## 2021-07-07 ENCOUNTER — Inpatient Hospital Stay: Payer: 59

## 2021-07-07 ENCOUNTER — Other Ambulatory Visit: Payer: Self-pay

## 2021-07-07 ENCOUNTER — Encounter: Payer: Self-pay | Admitting: Oncology

## 2021-07-07 ENCOUNTER — Inpatient Hospital Stay (HOSPITAL_BASED_OUTPATIENT_CLINIC_OR_DEPARTMENT_OTHER): Payer: 59 | Admitting: Oncology

## 2021-07-07 ENCOUNTER — Ambulatory Visit: Payer: Medicare Other

## 2021-07-07 VITALS — BP 136/82 | HR 86

## 2021-07-07 VITALS — BP 154/87 | HR 105 | Temp 96.7°F | Wt 134.0 lb

## 2021-07-07 DIAGNOSIS — Z5112 Encounter for antineoplastic immunotherapy: Secondary | ICD-10-CM | POA: Diagnosis not present

## 2021-07-07 DIAGNOSIS — C3491 Malignant neoplasm of unspecified part of right bronchus or lung: Secondary | ICD-10-CM

## 2021-07-07 DIAGNOSIS — Z5111 Encounter for antineoplastic chemotherapy: Secondary | ICD-10-CM

## 2021-07-07 DIAGNOSIS — I2699 Other pulmonary embolism without acute cor pulmonale: Secondary | ICD-10-CM | POA: Diagnosis not present

## 2021-07-07 DIAGNOSIS — D6481 Anemia due to antineoplastic chemotherapy: Secondary | ICD-10-CM

## 2021-07-07 DIAGNOSIS — T451X5A Adverse effect of antineoplastic and immunosuppressive drugs, initial encounter: Secondary | ICD-10-CM

## 2021-07-07 LAB — CBC WITH DIFFERENTIAL/PLATELET
Abs Immature Granulocytes: 0.06 10*3/uL (ref 0.00–0.07)
Basophils Absolute: 0.1 10*3/uL (ref 0.0–0.1)
Basophils Relative: 1 %
Eosinophils Absolute: 0.1 10*3/uL (ref 0.0–0.5)
Eosinophils Relative: 1 %
HCT: 32.8 % — ABNORMAL LOW (ref 39.0–52.0)
Hemoglobin: 11.2 g/dL — ABNORMAL LOW (ref 13.0–17.0)
Immature Granulocytes: 0 %
Lymphocytes Relative: 10 %
Lymphs Abs: 1.6 10*3/uL (ref 0.7–4.0)
MCH: 36.8 pg — ABNORMAL HIGH (ref 26.0–34.0)
MCHC: 34.1 g/dL (ref 30.0–36.0)
MCV: 107.9 fL — ABNORMAL HIGH (ref 80.0–100.0)
Monocytes Absolute: 1.1 10*3/uL — ABNORMAL HIGH (ref 0.1–1.0)
Monocytes Relative: 7 %
Neutro Abs: 13.4 10*3/uL — ABNORMAL HIGH (ref 1.7–7.7)
Neutrophils Relative %: 81 %
Platelets: 208 10*3/uL (ref 150–400)
RBC: 3.04 MIL/uL — ABNORMAL LOW (ref 4.22–5.81)
RDW: 22.5 % — ABNORMAL HIGH (ref 11.5–15.5)
WBC: 16.4 10*3/uL — ABNORMAL HIGH (ref 4.0–10.5)
nRBC: 0.1 % (ref 0.0–0.2)

## 2021-07-07 LAB — COMPREHENSIVE METABOLIC PANEL
ALT: 9 U/L (ref 0–44)
AST: 19 U/L (ref 15–41)
Albumin: 3.4 g/dL — ABNORMAL LOW (ref 3.5–5.0)
Alkaline Phosphatase: 78 U/L (ref 38–126)
Anion gap: 12 (ref 5–15)
BUN: 11 mg/dL (ref 6–20)
CO2: 24 mmol/L (ref 22–32)
Calcium: 9.2 mg/dL (ref 8.9–10.3)
Chloride: 100 mmol/L (ref 98–111)
Creatinine, Ser: 0.88 mg/dL (ref 0.61–1.24)
GFR, Estimated: >60 mL/min (ref >60)
Glucose, Bld: 157 mg/dL — ABNORMAL HIGH (ref 70–99)
Potassium: 3.3 mmol/L — ABNORMAL LOW (ref 3.5–5.1)
Sodium: 136 mmol/L (ref 135–145)
Total Bilirubin: 0.5 mg/dL (ref 0.3–1.2)
Total Protein: 7.8 g/dL (ref 6.5–8.1)

## 2021-07-07 LAB — TSH: TSH: 1.418 u[IU]/mL (ref 0.350–4.500)

## 2021-07-07 MED ORDER — PALONOSETRON HCL INJECTION 0.25 MG/5ML
0.2500 mg | Freq: Once | INTRAVENOUS | Status: AC
  Administered 2021-07-07: 10:00:00 0.25 mg via INTRAVENOUS

## 2021-07-07 MED ORDER — HEPARIN SOD (PORK) LOCK FLUSH 100 UNIT/ML IV SOLN
INTRAVENOUS | Status: AC
  Filled 2021-07-07: qty 5

## 2021-07-07 MED ORDER — SODIUM CHLORIDE 0.9 % IV SOLN
150.0000 mg | Freq: Once | INTRAVENOUS | Status: AC
  Administered 2021-07-07: 10:00:00 150 mg via INTRAVENOUS
  Filled 2021-07-07: qty 5

## 2021-07-07 MED ORDER — POTASSIUM CHLORIDE ER 20 MEQ PO TBCR: 1.0000 | EXTENDED_RELEASE_TABLET | ORAL | 2 refills | Status: DC

## 2021-07-07 MED ORDER — SODIUM CHLORIDE 0.9 % IV SOLN
530.0000 mg | Freq: Once | INTRAVENOUS | Status: AC
  Administered 2021-07-07: 12:00:00 530 mg via INTRAVENOUS
  Filled 2021-07-07: qty 53

## 2021-07-07 MED ORDER — SODIUM CHLORIDE 0.9 % IV SOLN
10.0000 mg | Freq: Once | INTRAVENOUS | Status: AC
  Administered 2021-07-07: 09:00:00 10 mg via INTRAVENOUS
  Filled 2021-07-07: qty 1

## 2021-07-07 MED ORDER — SODIUM CHLORIDE 0.9 % IV SOLN
Freq: Once | INTRAVENOUS | Status: AC
  Filled 2021-07-07: qty 250

## 2021-07-07 MED ORDER — SODIUM CHLORIDE 0.9 % IV SOLN
200.0000 mg | Freq: Once | INTRAVENOUS | Status: AC
  Administered 2021-07-07: 11:00:00 200 mg via INTRAVENOUS
  Filled 2021-07-07: qty 8

## 2021-07-07 MED ORDER — HEPARIN SOD (PORK) LOCK FLUSH 100 UNIT/ML IV SOLN
500.0000 [IU] | Freq: Once | INTRAVENOUS | Status: AC | PRN
  Administered 2021-07-07: 13:00:00 500 [IU]
  Filled 2021-07-07: qty 5

## 2021-07-07 NOTE — Progress Notes (Signed)
Patient tolerated [place] and Keytruda infusion well today, no concerns voiced. Patient discharged. Apts given. Stable.

## 2021-07-07 NOTE — Patient Instructions (Signed)
[facility] AT [place]  Discharge Instructions: Thank you for choosing [facility] to provide your oncology and hematology care.  If you have a lab appointment with the [facility], please go directly to the [facility] and check in at the registration area.  Wear comfortable clothing and clothing appropriate for easy access to any Portacath or PICC line.   We strive to give you quality time with your provider. You may need to reschedule your appointment if you arrive late (15 or more minutes).  Arriving late affects you and other patients whose appointments are after yours.  Also, if you miss three or more appointments without notifying the office, you may be dismissed from the clinic at the providers discretion.      For prescription refill requests, have your pharmacy contact our office and allow 72 hours for refills to be completed.    Today you received the following chemotherapy and/or immunotherapy agents : carbo/Keytruda   To help prevent nausea and vomiting after your treatment, we encourage you to take your nausea medication as directed.  BELOW ARE SYMPTOMS THAT SHOULD BE REPORTED IMMEDIATELY: *FEVER GREATER THAN 100.4 F (38 C) OR HIGHER *CHILLS OR SWEATING *NAUSEA AND VOMITING THAT IS NOT CONTROLLED WITH YOUR NAUSEA MEDICATION *UNUSUAL SHORTNESS OF BREATH *UNUSUAL BRUISING OR BLEEDING *URINARY PROBLEMS (pain or burning when urinating, or frequent urination) *BOWEL PROBLEMS (unusual diarrhea, constipation, pain near the anus) TENDERNESS IN MOUTH AND THROAT WITH OR WITHOUT PRESENCE OF ULCERS (sore throat, sores in mouth, or a toothache) UNUSUAL RASH, SWELLING OR PAIN  UNUSUAL VAGINAL DISCHARGE OR ITCHING   Items with * indicate a potential emergency and should be followed up as soon as possible or go to the Emergency Department if any problems should occur.  Please show the CHEMOTHERAPY ALERT CARD or IMMUNOTHERAPY ALERT CARD at check-in  to the Emergency Department and triage nurse.  Should you have questions after your visit or need to cancel or reschedule your appointment, please contact [facility] CANCER [place] AT [place]  [phone] and follow the prompts.  Office hours are 8:00 a.m. to 4:30 p.m. Monday - Friday. Please note that voicemails left after 4:00 p.m. may not be returned until the following business day.  We are closed weekends and major holidays. You have access to a nurse at all times for urgent questions. Please call the main number to the clinic [phone] and follow the prompts.  For any non-urgent questions, you may also contact your provider using MyChart. We now offer e-Visits for anyone 68 and older to request care online for non-urgent symptoms. For details visit [email or web address].   Also download the MyChart app! Go to the app store, search "MyChart", open the app, select [facility], and log in with your MyChart username and password.  Due to Covid, a mask is required upon entering the hospital/clinic. If you do not have a mask, one will be given to you upon arrival. For doctor visits, patients may have 1 support person aged 2 or older with them. For treatment visits, patients cannot have anyone with them due to current Covid guidelines and our immunocompromised population.

## 2021-07-07 NOTE — Progress Notes (Signed)
Hematology/Oncology Follow up note [phone] [phone]   Patient Care Team: Tracie Harrier, MD as PCP - General (Internal Medicine) Earlie Server, MD as Consulting Physician (Hematology and Oncology)  REASON FOR VISIT:  Follow up for treatment of squamous lung cancer.   HISTORY OF PRESENTING ILLNESS:  # Dec 2019 Stage I squamous lung cancer #s/p Bronchoscopy on 06/25/2018. subcarina EBUS FNA was non diagnostic, hypocellular specimen.  # 08/13/2018 CT guided right upper lobe biopsy pathology showed dense fibrosis and mixed inflammatory cells with prominent polytypic plasma cells component. Focal benign bronchial wall and alveolar spaces. No malignancy was identified.   # 08/13/2018 underwent right thoracotomy and wedge resection of a right upper lobe mass.  Frozen section was consistent with an inflammatory process.  On the second postop day, preliminary pathology reports high-grade malignancy.  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  08/20/2018 Patient was therefore taken back to the OR and underwent take complete lobectomy  pT1b pN0 cM0 stage I squamous cell lung cancer.  Margin is negative.  Recommend observation.    # 12/03/2018 CT chest w contrast showed local recurrence.  12/10/2018 PET showed No evidence of distant metastatic disease # 12/26/2018 s/p bronchoscopy biopsy. Confirmed local recurrence of squamous cell lung cancer.  medi port placed by Dr.Oaks.  # 06/14/2020 CT chest w contrast was reviewed and discussed with patient.  Evidence of progressive lymphangitic spread of tumor throughout the right lung, with contralateral lung nodules with  right pleural effusion  MRI brain is negative for CNS involvement.  CT abdomen with contrast showed no evidence of abdominal metastatic disease PET scan was not approved by insurance-peer to peer appeal with Dr.Vipul Bhanderi   # NGS - KRAS V14L,  TMB 26.6-high, MS stable, PD-L1 5% CDKN2C G48, DICER1c.2117-1G>T,  FLT4 G1131S, NF1 A470f, NF1 L2643*, STK11 N1827fCancer treatment Started concurrent chemoradiation on 01/16/2019 Carboplatin AUC of 2 and Taxol 45 mg/m2 weekly finished in 03/05/2019 04/10/2019, patient started on durvalumab maintenance. -CT showed progression.  Insurance denied PET scan evaluation. 07/16/2020-09/28/2020 4 cycles of carboplatin/paclitaxel/Keytruda  10/15/2020, CT chest abdomen pelvis redemonstrated postoperative and postradiation appearance of the right chest with perihilar consolidation and fibrosis.  Slight interval decrease in size of the pulmonary nodules associated with interlobular septal thickening nearly complete resolution of previously seen left-sided pulmonary nodules.  Minimal irregular residual.  Right pleural effusion improved.  Consistent with treatment response.  No evidence of metastatic disease with the abdomen or pelvis.  None obstructive bilateral nephrolithiasis - partial response to the treatment- 10/19/2020, Keytruda monotherapy maintenance  12/15/2020, CT chest without contrast showed resolution of lung nodules, no evidence of recurrent disease 12/30/2020, patient was admitted due to unprovoked extensive deep vein thrombosis from calf vein to the common femoral vein on the left.  Patient was started on heparin drip.  Patient underwent thrombolysis and is a colectomy on 12/31/2020.  Anticoagulation was switched to Eliquis at discharge.  04/22/2021, CT chest with contrast showed none occlusive pulmonary embolism, Interval progression of consolidation airspace opacity in the medial right middle lobe with bandlike opacity and bronchiectasis in the perihilar right lung,-this is compatible with evolving posttreatment changes although recurrent disease anteriorly is not excluded. Interval development of numerous bilateral pulmonary nodules, measuring up to 11 mm in the right lung base.  Concerning for metastatic disease.  Tiny right pleural effusion.   04/26/2021, resumed on  carboplatin/Taxol/Keytruda. Patient did not tolerate his chemotherapy of carboplatin/Taxol/Keytruda on 04/26/2021. Patient has developed severe anemia status post PRBC transfusion.  05/25/2021, carboplatin AUC 5 with [name] 06/23/2021  carboplatin AUC 5 with Keytruda  INTERVAL HISTORY Samuel Choi is a 55 y.o. male who has above history reviewed by me today presents for reatment of  recurrent  squamous cell carcinoma. Patient tolerates carboplatin with Keytruda.  Denies fever, chills, nausea vomiting diarrhea.  Chronic shortness of breath not worse.     Review of Systems  Constitutional:  Positive for fatigue and unexpected weight change. Negative for appetite change, chills and fever.  HENT:   Negative for hearing loss and voice change.   Eyes:  Negative for eye problems and icterus.  Respiratory:  Positive for shortness of breath. Negative for chest tightness and cough.   Cardiovascular:  Negative for chest pain and leg swelling.  Gastrointestinal:  Negative for abdominal distention, abdominal pain, nausea and vomiting.  Endocrine: Negative for hot flashes.  Genitourinary:  Negative for difficulty urinating, dysuria and frequency.   Musculoskeletal:  Negative for arthralgias.  Skin:  Negative for itching and rash.  Neurological:  Negative for light-headedness and numbness.  Hematological:  Negative for adenopathy. Does not bruise/bleed easily.  Psychiatric/Behavioral:  Negative for confusion.    MEDICAL HISTORY:  Past Medical History:  Diagnosis Date   Alcohol abuse    usually drinks 2-3 drinks per day   Atherosclerosis 06/2018   Chronic sinusitis    Dehydration 02/07/2019   Emphysema of lung ([place]) 06/2018   patient unaware of this.   Hip fracture ([place]) 05/2018   no surgery   History of kidney stones 05/2018   per xray, bilateral nephrolitiasis   Hypertension    Squamous cell carcinoma of lung, right ([place]) 06/2018    SURGICAL HISTORY: Past Surgical History:   Procedure Laterality Date   BRAIN SURGERY  10/2017   nasal/sinus endoscopy. mass benign   ELECTROMAGNETIC NAVIGATION BROCHOSCOPY Right 06/25/2018   Procedure: ELECTROMAGNETIC NAVIGATION BRONCHOSCOPY;  Surgeon: [name], MD;  Location: [facility];  Service: Cardiopulmonary;  Laterality: Right;   ENDOBRONCHIAL ULTRASOUND Right 06/25/2018   Procedure: ENDOBRONCHIAL ULTRASOUND;  Surgeon: [name], MD;  Location: [facility];  Service: Cardiopulmonary;  Laterality: Right;   ENDOBRONCHIAL ULTRASOUND Right 12/26/2018   Procedure: ENDOBRONCHIAL ULTRASOUND RIGHT;  Surgeon: [name], MD;  Location: [facility];  Service: Cardiopulmonary;  Laterality: Right;   FLEXIBLE BRONCHOSCOPY N/A 08/20/2018   Procedure: FLEXIBLE BRONCHOSCOPY PREOP;  Surgeon: [name], MD;  Location: [facility];  Service: Thoracic;  Laterality: N/A;   IR CV LINE INJECTION  04/04/2019   NASAL SINUS SURGERY  10/2017   At [facility], frontal sinusotomy, ethmoidectomy, resection anterior cranial fossa neoplasm, turbinate resection   PERIPHERAL VASCULAR THROMBECTOMY Left 12/31/2020   Procedure: PERIPHERAL VASCULAR THROMBECTOMY;  Surgeon: Algernon Huxley, MD;  Location: [place] [facility];  Service: Cardiovascular;  Laterality: Left;   PORTACATH PLACEMENT Left 01/15/2019   Procedure: INSERTION PORT-A-CATH;  Surgeon: [name], MD;  Location: [facility];  Service: General;  Laterality: Left;   THORACOTOMY Right 08/13/2018   Procedure: PREOP BROCHOSCOPY WITH RIGHT THORACOTOMY AND RUL RESECTION;  Surgeon: [name], MD;  Location: [facility];  Service: General;  Laterality: Right;   THORACOTOMY Right 08/20/2018   Procedure: THORACOTOMY MAJOR RIGHT UPPER LOBE LOBECTOMY;  Surgeon: [name], MD;  Location: [facility];  Service: Thoracic;  Laterality: Right;   TOE SURGERY Left    pin in left toe    SOCIAL HISTORY: Social History   Socioeconomic History   Marital status: Married    Spouse name: lisa  Number of children: Not on file    Years of education: Not on file   Highest education level: Not on file  Occupational History   Occupation: welding    Comment: taking time off to resolve issues  Tobacco Use   Smoking status: Former    Packs/day: 0.50    Years: 15.00    Pack years: 7.50    Types: Cigarettes    Quit date: 08/2018    Years since quitting: 2.9   Smokeless tobacco: Never  Vaping Use   Vaping Use: Never used  Substance and Sexual Activity   Alcohol use: Yes    Alcohol/week: 3.0 standard drinks    Types: 3 Cans of beer per week    Comment: usually 2 drinks per week, per patient   Drug use: No   Sexual activity: Not on file  Other Topics Concern   Not on file  Social History Narrative   Not on file   Social Determinants of Health   Financial Resource Strain: Not on file  Food Insecurity: Not on file  Transportation Needs: Not on file  Physical Activity: Not on file  Stress: Not on file  Social Connections: Not on file  Intimate Partner Violence: Not on file    FAMILY HISTORY: Family History  Problem Relation Age of Onset   Breast cancer Mother    Diabetes Mother    Lung cancer Father    Hypertension Father     ALLERGIES:  has No Known Allergies.  MEDICATIONS:  Current Outpatient Medications  Medication Sig Dispense Refill   albuterol (VENTOLIN HFA) 108 (90 Base) MCG/ACT inhaler INHALE 2 PUFFS BY MOUTH EVERY 6 HOURS AS NEEDED FOR WHEEZE OR SHORTNESS OF BREATH 8.5 g 5   apixaban (ELIQUIS) 5 MG TABS tablet Take 1 tablet (5 mg total) by mouth 2 (two) times daily. 60 tablet 3   budesonide-formoterol (SYMBICORT) 80-4.5 MCG/ACT inhaler Inhale 2 puffs into the lungs 2 (two) times daily. in the morning and at bedtime. 10.2 each 6   diltiazem (CARDIZEM CD) 120 MG 24 hr capsule Take 120 mg by mouth daily.     dronabinol (MARINOL) 5 MG capsule Take 1 capsule (5 mg total) by mouth 2 (two) times daily before a meal. 60 capsule 0   folic acid (V-R FOLIC ACID) 277 MCG tablet Take 1 tablet (400 mcg  total) by mouth daily. 90 tablet 1   hydrALAZINE (APRESOLINE) 100 MG tablet Take 50 mg by mouth 2 (two) times daily.     HYDROcodone-acetaminophen (NORCO/VICODIN) 5-325 MG tablet Take 1 tablet by mouth every 6 (six) hours as needed for severe pain. 10 tablet 0   latanoprost (XALATAN) 0.005 % ophthalmic solution SMARTSIG:In Eye(s)     loratadine (CLARITIN) 10 MG tablet Take 10 mg by mouth daily.     methocarbamol (ROBAXIN) 500 MG tablet Take 1 tablet (500 mg total) by mouth at bedtime. 30 tablet 0   metoprolol succinate (TOPROL-XL) 25 MG 24 hr tablet Take 1 tablet (25 mg total) by mouth daily. 30 tablet 0   nystatin (MYCOSTATIN) 100000 UNIT/ML suspension Take 5 mLs (500,000 Units total) by mouth 4 (four) times daily. Swish and swallow. 473 mL 1   olmesartan (BENICAR) 40 MG tablet Take 40 mg by mouth every other day.      ondansetron (ZOFRAN) 8 MG tablet Take 1 tablet (8 mg total) by mouth every 8 (eight) hours as needed for refractory nausea / vomiting. Start on day 3 after chemo. [place]  tablet 1   pantoprazole (PROTONIX) 20 MG tablet Take 1 tablet (20 mg total) by mouth daily. 60 tablet 2   promethazine (PHENERGAN) 25 MG tablet Take 1 tablet (25 mg total) by mouth every 6 (six) hours as needed for nausea or vomiting. 30 tablet 0   SPIRIVA HANDIHALER 18 MCG inhalation capsule INHALE 1 CAPSULE VIA HANDIHALER ONCE DAILY AT THE SAME TIME EVERY DAY 30 capsule 0   vitamin B-12 (CYANOCOBALAMIN) 1000 MCG tablet Take 1 tablet (1,000 mcg total) by mouth daily. 90 tablet 1   magnesium chloride (SLOW-MAG) 64 MG TBEC SR tablet Take 1 tablet (64 mg total) by mouth 2 (two) times daily. TAKE 1 TABLET (64 MG TOTAL) BY MOUTH DAILY. (Patient not taking: Reported on 07/07/2021) 60 tablet 1   Potassium Chloride ER 20 MEQ TBCR Take 1 tablet by mouth See admin instructions. Take 40 meq in AM and 68mq in PM 60 tablet 2   No current facility-administered medications for this visit.   Facility-Administered Medications Ordered  in Other Visits  Medication Dose Route Frequency Provider Last Rate Last Admin   dexamethasone (DECADRON) 10 MG/ML injection            heparin lock flush 100 unit/mL  500 Units Intravenous Once YEarlie Server MD       sodium chloride flush (NS) 0.9 % injection 10 mL  10 mL Intravenous PRN YEarlie Server MD   10 mL at 04/04/19 0903   sodium chloride flush (NS) 0.9 % injection 10 mL  10 mL Intravenous PRN YEarlie Server MD   10 mL at 07/26/20 1308     PHYSICAL EXAMINATION: ECOG PERFORMANCE STATUS: 1 - Symptomatic but completely ambulatory Vitals:   07/07/21 0833  BP: (!) 154/87  Pulse: (!) 105  Temp: (!) 96.7 F (35.9 C)   Filed Weights   07/07/21 0833  Weight: 134 lb (60.8 kg)    Physical Exam Constitutional:      General: He is not in acute distress. HENT:     Head: Normocephalic and atraumatic.     Mouth/Throat:     Comments:   Eyes:     General: No scleral icterus. Cardiovascular:     Rate and Rhythm: Normal rate and regular rhythm.     Heart sounds: Normal heart sounds.  Pulmonary:     Effort: Pulmonary effort is normal. No respiratory distress.     Breath sounds: No wheezing.     Comments: Decreased breath sound bilaterally Abdominal:     General: Bowel sounds are normal. There is no distension.     Palpations: Abdomen is soft.  Musculoskeletal:        General: No deformity. Normal range of motion.     Cervical back: Normal range of motion and neck supple.  Skin:    General: Skin is warm and dry.     Findings: No erythema or rash.  Neurological:     Mental Status: He is alert and oriented to person, place, and time. Mental status is at baseline.     Cranial Nerves: No cranial nerve deficit.     Coordination: Coordination normal.  Psychiatric:        Mood and Affect: Mood normal.     LABORATORY DATA:  I have reviewed the data as listed Lab Results  Component Value Date   WBC 16.4 (H) 07/07/2021   HGB 11.2 (L) 07/07/2021   HCT 32.8 (L) 07/07/2021   MCV 107.9 (H)  07/07/2021   PLT 208  07/07/2021   Recent Labs    08/31/20 0812 09/07/20 1117 06/16/21 0816 06/23/21 0814 07/07/21 0819  NA 137   < > 135 137 136  K 3.1*   < > 3.7 4.0 3.3*  CL 100   < > 102 100 100  CO2 25   < > 21* 24 24  GLUCOSE 107*   < > 135* 129* 157*  BUN 7   < > _0 CREATININE 0.79   < > 0.82 0.93 0.88  CALCIUM 8.9   < > 9.1 9.2 9.2  GFRNONAA >60   < > >60 >60 >60  PROT 7.4   < > 7.9 8.2* 7.8  ALBUMIN 3.4*   < > 3.0* 3.3* 3.4*  AST 31   < > _1 ALT 13   < > _2 ALKPHOS 101   < > 86 80 78  BILITOT 2.0*   < > 0.3 0.2* 0.5  BILIDIR 0.4*  --   --   --   --    < > = values in this interval not displayed.    Iron/TIBC/Ferritin/ %Sat No results found for: IRON, TIBC, FERRITIN, IRONPCTSAT   RADIOGRAPHIC STUDIES: I have personally reviewed the radiological images as listed and agreed with the findings in the report. CT CHEST W CONTRAST  Addendum Date: 04/22/2021   ADDENDUM REPORT: 04/22/2021 13:25 ADDENDUM: Critical Value/emergent results were called by telephone at the time of interpretation on 04/22/2021 at 1:24 pm to provider Miria Cappelli , who verbally acknowledged these results. Electronically Signed   By: Misty Stanley M.D.   On: 04/22/2021 13:25   Result Date: 04/22/2021 CLINICAL DATA:  Lung cancer restaging. EXAM: CT CHEST WITH CONTRAST TECHNIQUE: Multidetector CT imaging of the chest was performed during intravenous contrast administration. CONTRAST:  17m OMNIPAQUE IOHEXOL 350 MG/ML SOLN COMPARISON:  12/15/2020 FINDINGS: Cardiovascular: The heart size is normal. No substantial pericardial effusion. Mild atherosclerotic calcification is noted in the wall of the thoracic aorta. Left Port-A-Cath tip is positioned in the mid to distal SVC. Nonocclusive pulmonary embolus is identified in lobar right middle and lower lobe pulmonary arteries. Thrombus has a adherent quality in some regions suggesting that this may be nonacute. Mediastinum/Nodes: No mediastinal  lymphadenopathy. There is no hilar lymphadenopathy. Post treatment changes in the parahilar right lung are similar to prior study performed without intravenous contrast material. The esophagus has normal imaging features. There is no axillary lymphadenopathy. Lungs/Pleura: Interval progression of consolidative airspace opacity in the medial right middle lobe with bandlike opacity and bronchiectasis in the parahilar right lung. Volume loss right hemithorax is compatible with upper lobectomy. Nodular areas of airspace disease in the posterior right lung are similar to prior. The patient has a new irregular 11 mm nodule in the anterior right lung base with central cavitation (103/3). Multiple new tiny nodules are seen in the right lung base measuring 3-4 mm (see images 89, 97, 101, and 109. Numerous new pulmonary nodules in the left lung are also evident ranging in size from 3 mm up to about 4 mm maximum size (99/3). Tiny right pleural effusion. Upper Abdomen: Unremarkable. Musculoskeletal: No worrisome lytic or sclerotic osseous abnormality. IMPRESSION: 1. Nonocclusive pulmonary embolus in lobar right middle and lower lobe pulmonary arteries. Thrombus appears adherent in some regions, potentially nonacute. 2. Interval progression of consolidative airspace opacity in the medial right middle lobe with bandlike opacity and bronchiectasis in the parahilar right lung. Imaging features are compatible  with evolving post treatment change although recurrent disease anteriorly is not excluded. 3. Interval development of numerous bilateral pulmonary nodules, measuring up to 11 mm in the right lung base. Imaging features are highly concerning for metastatic disease. 4. Tiny right pleural effusion. 5. Aortic Atherosclerosis (ICD10-I70.0). Electronically Signed: By: Misty Stanley M.D. On: 04/22/2021 12:47      ASSESSMENT & PLAN:  1. Squamous cell carcinoma of right lung ([place])   2. Encounter for antineoplastic immunotherapy    3. Encounter for antineoplastic chemotherapy   4. Other acute pulmonary embolism without acute cor pulmonale ([place])   5. Antineoplastic chemotherapy induced anemia   6. Hypomagnesemia   7. Squamous cell carcinoma of lung, right ([facility])    #Recurrent Squamous cell carcinoma of lung, stage IV 07/16/2020-09/28/2020 carboplatin Taxol and Keytruda x 1  partial response -Keytruda maintenance.-04/22/2021, progression-resumed on carboplatin/Taxol/Keytruda - did not tolerate due to myelosuppression.  Currently on patient had carboplatin/Keytruda. Labs reviewed and discussed with patient. Proceed with carboplatin AUC 5/Keytruda. Repeat CT in January 2022.  #Weight loss, continue follow-up with nutritionist.  Patient has gained weight.  #Shortness of breath with exertion.  Combination of COPD/emphysema/lung cancer/symptomatic anemia.. Continue albuterol as needed, Symbicort.  Symptoms are stable.  #Unprovoked left lower extremity DVT, status post thrombolysis/thrombectomy. Nonocclusive PE noted on recent CT. Continue Eliquis 5 mg twice daily.  #Chronic hypomagnesia.  Continue slow magnesium twice daily.   #Chronic hypokalemia, improved.  Potassium is normal.  Continue potassium chloride 20 mEq twice daily.     return of visit:  Follow-up with me in 3 weeks lab MD carboplatin, Keytruda.   Review CT in January 2022  Earlie Server, MD, PhD  07/07/2021

## 2021-07-12 ENCOUNTER — Encounter: Payer: Self-pay | Admitting: Oncology

## 2021-07-15 ENCOUNTER — Ambulatory Visit
Admission: RE | Admit: 2021-07-15 | Discharge: 2021-07-15 | Disposition: A | Discharge status: Home / Self Care | Payer: 59 | Source: Ambulatory Visit | Attending: Oncology | Admitting: Oncology

## 2021-07-15 ENCOUNTER — Other Ambulatory Visit: Payer: Self-pay

## 2021-07-15 DIAGNOSIS — C3491 Malignant neoplasm of unspecified part of right bronchus or lung: Secondary | ICD-10-CM | POA: Insufficient documentation

## 2021-07-15 MED ORDER — IOHEXOL 300 MG/ML  SOLN
75.0000 mL | Freq: Once | INTRAMUSCULAR | Status: AC | PRN
  Administered 2021-07-15: 75 mL via INTRAVENOUS

## 2021-07-19 ENCOUNTER — Other Ambulatory Visit: Payer: Self-pay | Admitting: Oncology

## 2021-07-19 DIAGNOSIS — C3491 Malignant neoplasm of unspecified part of right bronchus or lung: Secondary | ICD-10-CM

## 2021-07-19 MED ORDER — DEXAMETHASONE 4 MG PO TABS: 8.0000 mg | ORAL_TABLET | Freq: Two times a day (BID) | ORAL | 1 refills | Status: DC

## 2021-07-19 NOTE — Progress Notes (Signed)
DISCONTINUE ON PATHWAY REGIMEN - Non-Small Cell Lung     A cycle is every 21 days:     Pembrolizumab      Paclitaxel      Carboplatin   **Always confirm dose/schedule in your pharmacy ordering system**  REASON: Disease Progression PRIOR TREATMENT: [id number]: Pembrolizumab 200 mg + Carboplatin AUC=6 + Paclitaxel 200 mg/m2 q21 Days x 4 Cycles TREATMENT RESPONSE: Progressive Disease (PD)  START ON PATHWAY REGIMEN - Non-Small Cell Lung     A cycle is every 21 days:     Ramucirumab      Docetaxel   **Always confirm dose/schedule in your pharmacy ordering system**  Patient Characteristics: Stage IV Metastatic, Squamous, Molecular Analysis Completed, Alteration Present and Targeted Therapy Exhausted or EGFR Exon 20 Insertion or KRAS G12C or HER2 Present, and No Prior Chemo/Immunotherapy or No Alteration Present, PS = 0, 1, Second Line -  Chemotherapy/Immunotherapy, Prior PD-1/PD-L1 Inhibitor + Chemotherapy or No Prior PD-1/PD-L1 Inhibitor, and Not a Candidate for Immunotherapy Therapeutic Status: Stage IV Metastatic Histology: Squamous Cell Molecular Analysis Results: No Alteration Present ECOG Performance Status: 1 Chemotherapy/Immunotherapy Line of Therapy: Second Line Chemotherapy/Immunotherapy Immunotherapy Candidate Status: Not a Candidate for Immunotherapy Prior Immunotherapy Status: Prior PD-1/PD-L1 Inhibitor + Chemotherapy Intent of Therapy: Non-Curative / Palliative Intent, Discussed with Patient 

## 2021-07-20 ENCOUNTER — Telehealth: Payer: Self-pay

## 2021-07-20 NOTE — Telephone Encounter (Signed)
-----   Message from Earlie Server, MD sent at 07/19/2021  9:16 PM EST ----- Please let him know that CT showed progression and I recommend to switch to other chemo regimen. Please change his next appt to lab md docetaxel and Ramicirumab + Onpro. I have sent dexamethasone Rx to his pharmacy, please advise patient to follow up instruction, take 8mg  on the day prior to his chemo day. Thanks.

## 2021-07-20 NOTE — Telephone Encounter (Signed)
Called and spoke with patient. Informed him of results and Dr. Collie Siad recommendations. Advised that rx was sent for dexamethasone to take 8 mg day prior to his tx. Patient verbalized understanding. Advised to continue with follow up instructions. Patient verbalized understanding.    [place], please change his next appt to  Lab/MD/Docetaxel/Ramicirumab + Onpro

## 2021-07-20 NOTE — Progress Notes (Signed)
Pharmacist Chemotherapy Monitoring - Initial Assessment    Anticipated start date: 07/27/21   The following has been reviewed per standard work regarding the patient's treatment regimen: The patient's diagnosis, treatment plan and drug doses, and organ/hematologic function Lab orders and baseline tests specific to treatment regimen  The treatment plan start date, drug sequencing, and pre-medications Prior authorization status  Patient's documented medication list, including drug-drug interaction screen and prescriptions for anti-emetics and supportive care specific to the treatment regimen The drug concentrations, fluid compatibility, administration routes, and timing of the medications to be used The patient's access for treatment and lifetime cumulative dose history, if applicable  The patient's medication allergies and previous infusion related reactions, if applicable   Changes made to treatment plan:  treatment plan date  Follow up needed:  Pending authorization for treatment  and signing treatment plan   Samuel Choi, [place], 07/20/2021  1:51 PM

## 2021-07-21 ENCOUNTER — Encounter: Payer: Self-pay | Admitting: Oncology

## 2021-07-22 ENCOUNTER — Encounter: Payer: Self-pay | Admitting: Oncology

## 2021-07-22 NOTE — Telephone Encounter (Signed)
error 

## 2021-07-28 ENCOUNTER — Ambulatory Visit: Payer: Medicare Other

## 2021-07-28 ENCOUNTER — Encounter: Payer: Self-pay | Admitting: Oncology

## 2021-07-28 ENCOUNTER — Other Ambulatory Visit: Payer: Self-pay

## 2021-07-28 ENCOUNTER — Inpatient Hospital Stay: Payer: 59 | Attending: Oncology

## 2021-07-28 ENCOUNTER — Inpatient Hospital Stay: Payer: 59

## 2021-07-28 ENCOUNTER — Observation Stay
Admission: EM | Admit: 2021-07-28 | Discharge: 2021-07-29 | Disposition: A | Discharge status: Home / Self Care | Payer: 59 | Attending: Student in an Organized Health Care Education/Training Program | Admitting: Student in an Organized Health Care Education/Training Program

## 2021-07-28 ENCOUNTER — Inpatient Hospital Stay (HOSPITAL_BASED_OUTPATIENT_CLINIC_OR_DEPARTMENT_OTHER): Payer: 59 | Admitting: Oncology

## 2021-07-28 VITALS — BP 155/81 | HR 91 | Temp 97.2°F | Wt 132.3 lb

## 2021-07-28 DIAGNOSIS — E876 Hypokalemia: Secondary | ICD-10-CM | POA: Insufficient documentation

## 2021-07-28 DIAGNOSIS — Z79899 Other long term (current) drug therapy: Secondary | ICD-10-CM | POA: Insufficient documentation

## 2021-07-28 DIAGNOSIS — C3411 Malignant neoplasm of upper lobe, right bronchus or lung: Secondary | ICD-10-CM | POA: Insufficient documentation

## 2021-07-28 DIAGNOSIS — D6481 Anemia due to antineoplastic chemotherapy: Secondary | ICD-10-CM | POA: Insufficient documentation

## 2021-07-28 DIAGNOSIS — Z86711 Personal history of pulmonary embolism: Secondary | ICD-10-CM | POA: Diagnosis not present

## 2021-07-28 DIAGNOSIS — Z86718 Personal history of other venous thrombosis and embolism: Secondary | ICD-10-CM | POA: Diagnosis not present

## 2021-07-28 DIAGNOSIS — T451X5A Adverse effect of antineoplastic and immunosuppressive drugs, initial encounter: Secondary | ICD-10-CM | POA: Diagnosis not present

## 2021-07-28 DIAGNOSIS — I10 Essential (primary) hypertension: Secondary | ICD-10-CM | POA: Diagnosis not present

## 2021-07-28 DIAGNOSIS — J449 Chronic obstructive pulmonary disease, unspecified: Secondary | ICD-10-CM | POA: Diagnosis not present

## 2021-07-28 DIAGNOSIS — C3491 Malignant neoplasm of unspecified part of right bronchus or lung: Secondary | ICD-10-CM

## 2021-07-28 DIAGNOSIS — Z5112 Encounter for antineoplastic immunotherapy: Secondary | ICD-10-CM | POA: Diagnosis present

## 2021-07-28 DIAGNOSIS — Z7901 Long term (current) use of anticoagulants: Secondary | ICD-10-CM | POA: Diagnosis not present

## 2021-07-28 DIAGNOSIS — I2510 Atherosclerotic heart disease of native coronary artery without angina pectoris: Secondary | ICD-10-CM | POA: Diagnosis not present

## 2021-07-28 DIAGNOSIS — Z5111 Encounter for antineoplastic chemotherapy: Secondary | ICD-10-CM

## 2021-07-28 DIAGNOSIS — Z87891 Personal history of nicotine dependence: Secondary | ICD-10-CM | POA: Diagnosis not present

## 2021-07-28 DIAGNOSIS — Z20822 Contact with and (suspected) exposure to covid-19: Secondary | ICD-10-CM | POA: Insufficient documentation

## 2021-07-28 DIAGNOSIS — I2699 Other pulmonary embolism without acute cor pulmonale: Secondary | ICD-10-CM

## 2021-07-28 DIAGNOSIS — E875 Hyperkalemia: Principal | ICD-10-CM | POA: Insufficient documentation

## 2021-07-28 DIAGNOSIS — Z5189 Encounter for other specified aftercare: Secondary | ICD-10-CM | POA: Insufficient documentation

## 2021-07-28 DIAGNOSIS — D696 Thrombocytopenia, unspecified: Secondary | ICD-10-CM

## 2021-07-28 DIAGNOSIS — K219 Gastro-esophageal reflux disease without esophagitis: Secondary | ICD-10-CM | POA: Diagnosis not present

## 2021-07-28 LAB — TSH: TSH: 0.413 u[IU]/mL (ref 0.350–4.500)

## 2021-07-28 LAB — CBC WITH DIFFERENTIAL/PLATELET
Abs Immature Granulocytes: 0.03 10*3/uL (ref 0.00–0.07)
Abs Immature Granulocytes: 0.04 10*3/uL (ref 0.00–0.07)
Basophils Absolute: 0 10*3/uL (ref 0.0–0.1)
Basophils Absolute: 0 10*3/uL (ref 0.0–0.1)
Basophils Relative: 0 %
Basophils Relative: 0 %
Eosinophils Absolute: 0 10*3/uL (ref 0.0–0.5)
Eosinophils Absolute: 0 10*3/uL (ref 0.0–0.5)
Eosinophils Relative: 0 %
Eosinophils Relative: 0 %
HCT: 36.1 % — ABNORMAL LOW (ref 39.0–52.0)
HCT: 34.1 % — ABNORMAL LOW (ref 39.0–52.0)
Hemoglobin: 12.4 g/dL — ABNORMAL LOW (ref 13.0–17.0)
Hemoglobin: 11.8 g/dL — ABNORMAL LOW (ref 13.0–17.0)
Immature Granulocytes: 0 %
Immature Granulocytes: 1 %
Lymphocytes Relative: 7 %
Lymphocytes Relative: 5 %
Lymphs Abs: 0.5 10*3/uL — ABNORMAL LOW (ref 0.7–4.0)
Lymphs Abs: 0.4 10*3/uL — ABNORMAL LOW (ref 0.7–4.0)
MCH: 39.7 pg — ABNORMAL HIGH (ref 26.0–34.0)
MCH: 40 pg — ABNORMAL HIGH (ref 26.0–34.0)
MCHC: 34.3 g/dL (ref 30.0–36.0)
MCHC: 34.6 g/dL (ref 30.0–36.0)
MCV: 115.7 fL — ABNORMAL HIGH (ref 80.0–100.0)
MCV: 115.6 fL — ABNORMAL HIGH (ref 80.0–100.0)
Monocytes Absolute: 0.1 10*3/uL (ref 0.1–1.0)
Monocytes Absolute: 0.1 10*3/uL (ref 0.1–1.0)
Monocytes Relative: 1 %
Monocytes Relative: 1 %
Neutro Abs: 6.8 10*3/uL (ref 1.7–7.7)
Neutro Abs: 7.7 10*3/uL (ref 1.7–7.7)
Neutrophils Relative %: 92 %
Neutrophils Relative %: 93 %
Platelets: 116 10*3/uL — ABNORMAL LOW (ref 150–400)
Platelets: 112 10*3/uL — ABNORMAL LOW (ref 150–400)
RBC: 3.12 MIL/uL — ABNORMAL LOW (ref 4.22–5.81)
RBC: 2.95 MIL/uL — ABNORMAL LOW (ref 4.22–5.81)
RDW: 19.1 % — ABNORMAL HIGH (ref 11.5–15.5)
RDW: 19.5 % — ABNORMAL HIGH (ref 11.5–15.5)
WBC: 7.4 10*3/uL (ref 4.0–10.5)
WBC: 8.2 10*3/uL (ref 4.0–10.5)
nRBC: 0.3 % — ABNORMAL HIGH (ref 0.0–0.2)
nRBC: 0.2 % (ref 0.0–0.2)

## 2021-07-28 LAB — BASIC METABOLIC PANEL
Anion gap: 10 (ref 5–15)
Anion gap: 8 (ref 5–15)
BUN: 16 mg/dL (ref 6–20)
BUN: 17 mg/dL (ref 6–20)
CO2: 19 mmol/L — ABNORMAL LOW (ref 22–32)
CO2: 21 mmol/L — ABNORMAL LOW (ref 22–32)
Calcium: 9.4 mg/dL (ref 8.9–10.3)
Calcium: 9.7 mg/dL (ref 8.9–10.3)
Chloride: 107 mmol/L (ref 98–111)
Chloride: 109 mmol/L (ref 98–111)
Creatinine, Ser: 0.95 mg/dL (ref 0.61–1.24)
Creatinine, Ser: 1.04 mg/dL (ref 0.61–1.24)
GFR, Estimated: >60 mL/min (ref >60)
GFR, Estimated: >60 mL/min (ref >60)
Glucose, Bld: 141 mg/dL — ABNORMAL HIGH (ref 70–99)
Glucose, Bld: 121 mg/dL — ABNORMAL HIGH (ref 70–99)
Potassium: 6.7 mmol/L (ref 3.5–5.1)
Potassium: 7.5 mmol/L (ref 3.5–5.1)
Sodium: 136 mmol/L (ref 135–145)
Sodium: 138 mmol/L (ref 135–145)

## 2021-07-28 LAB — POTASSIUM: Potassium: 5.9 mmol/L — ABNORMAL HIGH (ref 3.5–5.1)

## 2021-07-28 LAB — COMPREHENSIVE METABOLIC PANEL
ALT: 11 U/L (ref 0–44)
AST: 28 U/L (ref 15–41)
Albumin: 3.8 g/dL (ref 3.5–5.0)
Alkaline Phosphatase: 85 U/L (ref 38–126)
Anion gap: 12 (ref 5–15)
BUN: 15 mg/dL (ref 6–20)
CO2: 18 mmol/L — ABNORMAL LOW (ref 22–32)
Calcium: 9.7 mg/dL (ref 8.9–10.3)
Chloride: 108 mmol/L (ref 98–111)
Creatinine, Ser: 1.14 mg/dL (ref 0.61–1.24)
GFR, Estimated: >60 mL/min (ref >60)
Glucose, Bld: 148 mg/dL — ABNORMAL HIGH (ref 70–99)
Potassium: 6 mmol/L — ABNORMAL HIGH (ref 3.5–5.1)
Sodium: 138 mmol/L (ref 135–145)
Total Bilirubin: 0.4 mg/dL (ref 0.3–1.2)
Total Protein: 8.8 g/dL — ABNORMAL HIGH (ref 6.5–8.1)

## 2021-07-28 LAB — PROTEIN, URINE, RANDOM: Total Protein, Urine: 21 mg/dL

## 2021-07-28 LAB — RESP PANEL BY RT-PCR (FLU A&B, COVID) ARPGX2
Influenza A by PCR: NEGATIVE
Influenza B by PCR: NEGATIVE
SARS Coronavirus 2 by RT PCR: NEGATIVE

## 2021-07-28 LAB — T4, FREE: Free T4: 0.83 ng/dL (ref 0.61–1.12)

## 2021-07-28 LAB — GLUCOSE, CAPILLARY: Glucose-Capillary: 91 mg/dL (ref 70–99)

## 2021-07-28 MED ORDER — ALBUTEROL SULFATE (2.5 MG/3ML) 0.083% IN NEBU
10.0000 mg | INHALATION_SOLUTION | Freq: Once | RESPIRATORY_TRACT | Status: AC
  Administered 2021-07-28: 10 mg via RESPIRATORY_TRACT
  Filled 2021-07-28: qty 12

## 2021-07-28 MED ORDER — INSULIN ASPART 100 UNIT/ML IV SOLN
10.0000 [IU] | Freq: Once | INTRAVENOUS | Status: AC
  Administered 2021-07-28: 10 [IU] via INTRAVENOUS
  Filled 2021-07-28: qty 0.1

## 2021-07-28 MED ORDER — HYDRALAZINE HCL 50 MG PO TABS
50.0000 mg | ORAL_TABLET | Freq: Two times a day (BID) | ORAL | Status: DC
Start: 2021-07-28 — End: 2021-07-29
  Administered 2021-07-28: 21:00:00 50 mg via ORAL
  Filled 2021-07-28 (×2): qty 1

## 2021-07-28 MED ORDER — FOLIC ACID 1 MG PO TABS
500.0000 ug | ORAL_TABLET | Freq: Every day | ORAL | Status: DC
  Administered 2021-07-29: 0.5 mg via ORAL
  Filled 2021-07-28 (×2): qty 1

## 2021-07-28 MED ORDER — ONDANSETRON HCL 4 MG PO TABS: 8.0000 mg | ORAL_TABLET | Freq: Three times a day (TID) | ORAL | Status: DC | PRN

## 2021-07-28 MED ORDER — SODIUM ZIRCONIUM CYCLOSILICATE 10 G PO PACK
10.0000 g | PACK | Freq: Once | ORAL | Status: AC
  Administered 2021-07-28: 14:00:00 10 g via ORAL
  Filled 2021-07-28: qty 1

## 2021-07-28 MED ORDER — ALBUTEROL SULFATE (2.5 MG/3ML) 0.083% IN NEBU
INHALATION_SOLUTION | RESPIRATORY_TRACT | Status: AC
  Filled 2021-07-28: qty 3

## 2021-07-28 MED ORDER — PANTOPRAZOLE SODIUM 20 MG PO TBEC: 20.0000 mg | DELAYED_RELEASE_TABLET | Freq: Every day | ORAL | Status: DC

## 2021-07-28 MED ORDER — PANTOPRAZOLE SODIUM 40 MG PO TBEC
40.0000 mg | DELAYED_RELEASE_TABLET | Freq: Every day | ORAL | Status: DC
  Administered 2021-07-28 – 2021-07-29 (×2): 40 mg via ORAL
  Filled 2021-07-28 (×2): qty 1

## 2021-07-28 MED ORDER — MAGNESIUM HYDROXIDE 400 MG/5ML PO SUSP
30.0000 mL | Freq: Every day | ORAL | Status: DC | PRN
  Filled 2021-07-28: qty 30

## 2021-07-28 MED ORDER — TRAZODONE HCL 50 MG PO TABS: 25.0000 mg | ORAL_TABLET | Freq: Every evening | ORAL | Status: DC | PRN

## 2021-07-28 MED ORDER — VITAMIN B-12 1000 MCG PO TABS
1000.0000 ug | ORAL_TABLET | Freq: Every day | ORAL | Status: DC
  Administered 2021-07-29: 1000 ug via ORAL
  Filled 2021-07-28: qty 1

## 2021-07-28 MED ORDER — SODIUM ZIRCONIUM CYCLOSILICATE 10 G PO PACK
10.0000 g | PACK | Freq: Once | ORAL | Status: AC
  Administered 2021-07-28: 10 g via ORAL
  Filled 2021-07-28: qty 1

## 2021-07-28 MED ORDER — DILTIAZEM HCL ER COATED BEADS 120 MG PO CP24
120.0000 mg | ORAL_CAPSULE | Freq: Every day | ORAL | Status: DC
  Administered 2021-07-29: 09:00:00 120 mg via ORAL
  Filled 2021-07-28 (×2): qty 1

## 2021-07-28 MED ORDER — DEXTROSE 50 % IV SOLN
1.0000 | Freq: Once | INTRAVENOUS | Status: AC
  Administered 2021-07-28: 50 mL via INTRAVENOUS
  Filled 2021-07-28: qty 50

## 2021-07-28 MED ORDER — ALBUTEROL SULFATE (2.5 MG/3ML) 0.083% IN NEBU
2.5000 mg | INHALATION_SOLUTION | Freq: Once | RESPIRATORY_TRACT | Status: DC
  Filled 2021-07-28: qty 3

## 2021-07-28 MED ORDER — ACETAMINOPHEN 325 MG PO TABS: 650.0000 mg | ORAL_TABLET | Freq: Four times a day (QID) | ORAL | Status: DC | PRN

## 2021-07-28 MED ORDER — ONDANSETRON HCL 4 MG PO TABS: 4.0000 mg | ORAL_TABLET | Freq: Four times a day (QID) | ORAL | Status: DC | PRN

## 2021-07-28 MED ORDER — DRONABINOL 2.5 MG PO CAPS
5.0000 mg | ORAL_CAPSULE | Freq: Two times a day (BID) | ORAL | Status: DC
Start: 2021-07-28 — End: 2021-07-29
  Administered 2021-07-28 – 2021-07-29 (×2): 5 mg via ORAL
  Filled 2021-07-28: qty 1
  Filled 2021-07-28 (×2): qty 2

## 2021-07-28 MED ORDER — SODIUM CHLORIDE 0.9 % IV BOLUS
1000.0000 mL | Freq: Once | INTRAVENOUS | Status: AC
Start: 2021-07-28 — End: 2021-07-28
  Administered 2021-07-28: 1000 mL via INTRAVENOUS

## 2021-07-28 MED ORDER — CALCIUM GLUCONATE 10 % IV SOLN
1.0000 g | Freq: Once | INTRAVENOUS | Status: AC
  Administered 2021-07-28: 1 g via INTRAVENOUS
  Filled 2021-07-28: qty 10

## 2021-07-28 MED ORDER — ENOXAPARIN SODIUM 40 MG/0.4ML IJ SOSY: 40.0000 mg | PREFILLED_SYRINGE | INTRAMUSCULAR | Status: DC

## 2021-07-28 MED ORDER — SODIUM BICARBONATE 8.4 % IV SOLN
100.0000 meq | INTRAVENOUS | Status: AC
  Administered 2021-07-28: 15:00:00 100 meq via INTRAVENOUS
  Filled 2021-07-28: qty 100

## 2021-07-28 MED ORDER — SODIUM CHLORIDE 0.9% FLUSH
10.0000 mL | INTRAVENOUS | Status: AC | PRN
  Administered 2021-07-28: 10 mL via INTRAVENOUS
  Filled 2021-07-28: qty 10

## 2021-07-28 MED ORDER — METHOCARBAMOL 500 MG PO TABS
500.0000 mg | ORAL_TABLET | Freq: Every day | ORAL | Status: DC
  Administered 2021-07-28: 21:00:00 500 mg via ORAL
  Filled 2021-07-28 (×2): qty 1

## 2021-07-28 MED ORDER — SODIUM CHLORIDE 0.9 % IV SOLN: INTRAVENOUS | Status: DC

## 2021-07-28 MED ORDER — IRBESARTAN 75 MG PO TABS: 37.5000 mg | ORAL_TABLET | Freq: Every day | ORAL | Status: DC

## 2021-07-28 MED ORDER — ONDANSETRON HCL 4 MG/2ML IJ SOLN: 4.0000 mg | Freq: Four times a day (QID) | INTRAMUSCULAR | Status: DC | PRN

## 2021-07-28 MED ORDER — HEPARIN SOD (PORK) LOCK FLUSH 100 UNIT/ML IV SOLN
500.0000 [IU] | Freq: Once | INTRAVENOUS | Status: AC
  Filled 2021-07-28: qty 5

## 2021-07-28 MED ORDER — ACETAMINOPHEN 650 MG RE SUPP: 650.0000 mg | Freq: Four times a day (QID) | RECTAL | Status: DC | PRN

## 2021-07-28 MED ORDER — APIXABAN 5 MG PO TABS
5.0000 mg | ORAL_TABLET | Freq: Two times a day (BID) | ORAL | Status: DC
  Administered 2021-07-28 – 2021-07-29 (×2): 5 mg via ORAL
  Filled 2021-07-28 (×2): qty 1

## 2021-07-28 MED ORDER — SODIUM BICARBONATE 8.4 % IV SOLN
100.0000 meq | Freq: Once | INTRAVENOUS | Status: DC
  Filled 2021-07-28: qty 50

## 2021-07-28 MED ORDER — METOPROLOL SUCCINATE ER 25 MG PO TB24
25.0000 mg | ORAL_TABLET | Freq: Every day | ORAL | Status: DC
  Administered 2021-07-29: 09:00:00 25 mg via ORAL
  Filled 2021-07-28: qty 1

## 2021-07-28 MED ORDER — FUROSEMIDE 10 MG/ML IJ SOLN
40.0000 mg | Freq: Once | INTRAMUSCULAR | Status: AC
  Administered 2021-07-28: 40 mg via INTRAVENOUS
  Filled 2021-07-28: qty 4

## 2021-07-28 MED ORDER — HYDROCODONE-ACETAMINOPHEN 5-325 MG PO TABS: 1.0000 | ORAL_TABLET | Freq: Four times a day (QID) | ORAL | Status: DC | PRN

## 2021-07-28 NOTE — ED Triage Notes (Signed)
Pt was sent from the [facility] center with elevated K+ 6.7, pt is currently be treated for lund CA, pt is ambulatory to triage with a steady gait.

## 2021-07-28 NOTE — H&P (Signed)
[place]   PATIENT NAME: Samuel Choi    MR#:  [id number]  DATE OF BIRTH:  [birth year]  DATE OF ADMISSION:  07/28/2021  PRIMARY CARE PHYSICIAN: Tracie Harrier, MD   Patient is coming from: Home  REQUESTING/REFERRING PHYSICIAN: Vladimir Crofts, MD  CHIEF COMPLAINT:   Chief Complaint  Patient presents with   Abnormal Lab    HISTORY OF PRESENT ILLNESS:  Samuel Choi is a 56 y.o. African-American male with medical history significant for emphysema, hypertension, DVT and PE on Eliquis and alcohol abuse, who presented to the emergency room for management of abnormal labs.  The patient had an outpatient visit with his PCP this morning.  He is status post 2 right upper lobe wedge resection of the lung followed by lobectomy for stage I squamous cell lung carcinoma, status post chemotherapy and radiotherapy with history of DVT in June 2022 for which she was started on Eliquis.  He had labs revealing hyperkalemia today with a potassium of 6 and repeat level later on revealed a potassium of 6.7 and later 78 in the ER after being sent for evaluation.  The patient denies any chest pain or palpitations.  No dyspnea orthopnea or proximal nocturnal dyspnea.  No recent cough or hemoptysis.  No leg pain or edema.  He tells me that he takes 20 metoprolol p.o. potassium chloride twice daily and he took it this morning before being seen.  ED Course: When he came to the ER vital signs were within normal initially and later on he was mildly tachypneic with RR of 30 and later 24 then 18.  BMP revealed a potassium of 7.5 with a CO2 of 21 and CBC showed mild anemia close to previous levels.  Influenza antigens and COVID-19 PCR came back negative. EKG as reviewed by me : EKG showed normal sinus rhythm with rate of 77 with biatrial enlargement, right bundle branch block and left posterior fascicular block with Q waves anteroseptally  The patient was given a gram of IV calcium gluconate, nebulized albuterol  and 40 mg of IV Lasix.  He will be admitted to a an observation medical telemetry bed for further evaluation and management. PAST MEDICAL HISTORY:   Past Medical History:  Diagnosis Date   Alcohol abuse    usually drinks 2-3 drinks per day   Atherosclerosis 06/2018   Chronic sinusitis    Dehydration 02/07/2019   Emphysema of lung ([place]) 06/2018   patient unaware of this.   Hip fracture ([place]) 05/2018   no surgery   History of kidney stones 05/2018   per xray, bilateral nephrolitiasis   Hypertension    Squamous cell carcinoma of lung, right ([place]) 06/2018  History of DVT and PE on Eliquis.  PAST SURGICAL HISTORY:   Past Surgical History:  Procedure Laterality Date   BRAIN SURGERY  10/2017   nasal/sinus endoscopy. mass benign   ELECTROMAGNETIC NAVIGATION BROCHOSCOPY Right 06/25/2018   Procedure: ELECTROMAGNETIC NAVIGATION BRONCHOSCOPY;  Surgeon: [name], MD;  Location: [facility];  Service: Cardiopulmonary;  Laterality: Right;   ENDOBRONCHIAL ULTRASOUND Right 06/25/2018   Procedure: ENDOBRONCHIAL ULTRASOUND;  Surgeon: [name], MD;  Location: [facility];  Service: Cardiopulmonary;  Laterality: Right;   ENDOBRONCHIAL ULTRASOUND Right 12/26/2018   Procedure: ENDOBRONCHIAL ULTRASOUND RIGHT;  Surgeon: [name], MD;  Location: [facility];  Service: Cardiopulmonary;  Laterality: Right;   FLEXIBLE BRONCHOSCOPY N/A 08/20/2018   Procedure: FLEXIBLE BRONCHOSCOPY PREOP;  Surgeon: [name], MD;  Location: [facility];  Service: Thoracic;  Laterality: N/A;   IR CV LINE INJECTION  04/04/2019   NASAL SINUS SURGERY  10/2017   At [facility], frontal sinusotomy, ethmoidectomy, resection anterior cranial fossa neoplasm, turbinate resection   PERIPHERAL VASCULAR THROMBECTOMY Left 12/31/2020   Procedure: PERIPHERAL VASCULAR THROMBECTOMY;  Surgeon: Algernon Huxley, MD;  Location: [place] [facility];  Service: Cardiovascular;  Laterality: Left;   PORTACATH PLACEMENT Left 01/15/2019    Procedure: INSERTION PORT-A-CATH;  Surgeon: [name], MD;  Location: [facility];  Service: General;  Laterality: Left;   THORACOTOMY Right 08/13/2018   Procedure: PREOP BROCHOSCOPY WITH RIGHT THORACOTOMY AND RUL RESECTION;  Surgeon: [name], MD;  Location: [facility];  Service: General;  Laterality: Right;   THORACOTOMY Right 08/20/2018   Procedure: THORACOTOMY MAJOR RIGHT UPPER LOBE LOBECTOMY;  Surgeon: [name], MD;  Location: [facility];  Service: Thoracic;  Laterality: Right;   TOE SURGERY Left    pin in left toe    SOCIAL HISTORY:   Social History   Tobacco Use   Smoking status: Former    Packs/day: 0.50    Years: 15.00    Pack years: 7.50    Types: Cigarettes    Quit date: 08/2018    Years since quitting: 2.9   Smokeless tobacco: Never  Substance Use Topics   Alcohol use: Yes    Alcohol/week: 3.0 standard drinks    Types: 3 Cans of beer per week    Comment: usually 2 drinks per week, per patient    FAMILY HISTORY:   Family History  Problem Relation Age of Onset   Breast cancer Mother    Diabetes Mother    Lung cancer Father    Hypertension Father     DRUG ALLERGIES:  No Known Allergies  REVIEW OF SYSTEMS:   ROS As per history of present illness. All pertinent systems were reviewed above. Constitutional, HEENT, cardiovascular, respiratory, GI, GU, musculoskeletal, neuro, psychiatric, endocrine, integumentary and hematologic systems were reviewed and are otherwise negative/unremarkable except for positive findings mentioned above in the HPI.   MEDICATIONS AT HOME:   Prior to Admission medications   Medication Sig Start Date End Date Taking? Authorizing Provider  albuterol (VENTOLIN HFA) 108 (90 Base) MCG/ACT inhaler INHALE 2 PUFFS BY MOUTH EVERY 6 HOURS AS NEEDED FOR WHEEZE OR SHORTNESS OF BREATH 04/27/21   Earlie Server, MD  apixaban (ELIQUIS) 5 MG TABS tablet Take 1 tablet (5 mg total) by mouth 2 (two) times daily. 06/08/21   Earlie Server, MD   budesonide-formoterol [facility]) 80-4.5 MCG/ACT inhaler Inhale 2 puffs into the lungs 2 (two) times daily. in the morning and at bedtime. 04/05/21   Earlie Server, MD  dexamethasone (DECADRON) 4 MG tablet Take 2 tablets (8 mg total) by mouth 2 (two) times daily. Start the day before Taxotere. Then daily after chemo for 2 days. 07/19/21   Earlie Server, MD  diltiazem (CARDIZEM CD) 120 MG 24 hr capsule Take 120 mg by mouth daily. 06/19/19   [provider]  dronabinol (MARINOL) 5 MG capsule Take 1 capsule (5 mg total) by mouth 2 (two) times daily before a meal. 06/08/21   Earlie Server, MD  folic acid (V-R FOLIC ACID) 038 MCG tablet Take 1 tablet (400 mcg total) by mouth daily. 02/07/19   Earlie Server, MD  hydrALAZINE (APRESOLINE) 100 MG tablet Take 50 mg by mouth 2 (two) times daily.    [provider]  HYDROcodone-acetaminophen (NORCO/VICODIN) 5-325 MG tablet Take 1 tablet by mouth every  6 (six) hours as needed for severe pain. 01/02/21   Loletha Grayer, MD  latanoprost (XALATAN) 0.005 % ophthalmic solution SMARTSIG:In Eye(s) 05/04/21   [provider]  loratadine (CLARITIN) 10 MG tablet Take 10 mg by mouth daily.    [provider]  magnesium chloride (SLOW-MAG) 64 MG TBEC SR tablet Take 1 tablet (64 mg total) by mouth 2 (two) times daily. TAKE 1 TABLET (64 MG TOTAL) BY MOUTH DAILY. Patient not taking: Reported on 07/07/2021 08/10/20   Earlie Server, MD  methocarbamol (ROBAXIN) 500 MG tablet Take 1 tablet (500 mg total) by mouth at bedtime. 05/02/19   Earlie Server, MD  metoprolol succinate (TOPROL-XL) 25 MG 24 hr tablet Take 1 tablet (25 mg total) by mouth daily. 01/02/21   Loletha Grayer, MD  nystatin (MYCOSTATIN) 100000 UNIT/ML suspension Take 5 mLs (500,000 Units total) by mouth 4 (four) times daily. Swish and swallow. 05/17/21   Earlie Server, MD  olmesartan (BENICAR) 40 MG tablet Take 40 mg by mouth every other day.  08/28/17   [provider]  ondansetron (ZOFRAN) 8 MG tablet Take 1  tablet (8 mg total) by mouth every 8 (eight) hours as needed for refractory nausea / vomiting. Start on day 3 after chemo. 06/13/21   Earlie Server, MD  pantoprazole (PROTONIX) 20 MG tablet Take 1 tablet (20 mg total) by mouth daily. 04/29/21   Jacquelin Hawking, NP  Potassium Chloride ER 20 MEQ TBCR Take 1 tablet by mouth See admin instructions. Take 40 meq in AM and 48mq in PM 07/07/21   YEarlie Server MD  promethazine (PHENERGAN) 25 MG tablet Take 1 tablet (25 mg total) by mouth every 6 (six) hours as needed for nausea or vomiting. 06/13/21   YEarlie Server MD  SPIRIVA HANDIHALER 18 MCG inhalation capsule INHALE 1 CAPSULE VIA HANDIHALER ONCE DAILY AT THE SAME TIME EVERY DAY 04/08/20   YEarlie Server MD  vitamin B-12 (CYANOCOBALAMIN) 1000 MCG tablet Take 1 tablet (1,000 mcg total) by mouth daily. 08/04/19   YEarlie Server MD      VITAL SIGNS:  Blood pressure 124/77, pulse 69, temperature 98.7 F (37.1 C), temperature source Oral, resp. rate 18, height _0  (1.854 m), weight 60.3 kg, SpO2 98 %.  PHYSICAL EXAMINATION:  Physical Exam  GENERAL:  56y.o.-year-old cachectic African-American male patient lying in the bed with no acute distress.  EYES: Pupils equal, round, reactive to light and accommodation. No scleral icterus. Extraocular muscles intact.  HEENT: Head atraumatic, normocephalic. Oropharynx and nasopharynx clear.  NECK:  Supple, no jugular venous distention. No thyroid enlargement, no tenderness.  LUNGS: Normal breath sounds bilaterally, no wheezing, rales,rhonchi or crepitation. No use of accessory muscles of respiration.  CARDIOVASCULAR: Regular rate and rhythm, S1, S2 normal. No murmurs, rubs, or gallops.  ABDOMEN: Soft, nondistended, nontender. Bowel sounds present. No organomegaly or mass.  EXTREMITIES: No pedal edema, cyanosis, or clubbing.  NEUROLOGIC: Cranial nerves II through XII are intact. Muscle strength 5/5 in all extremities. Sensation intact. Gait not checked.  PSYCHIATRIC: The patient is  alert and oriented x 3.  Normal affect and good eye contact. SKIN: No obvious rash, lesion, or ulcer.  Left-sided chest wall port in place.  LABORATORY PANEL:   CBC Recent Labs  Lab 07/28/21 1138  WBC 8.2  HGB 11.8*  HCT 34.1*  PLT 112*   ------------------------------------------------------------------------------------------------------------------  Chemistries  Recent Labs  Lab 07/28/21 0818 07/28/21 0905  NA 138 136  K 6.0* 6.7*  CL 108  107  CO2 18* 19*  GLUCOSE 148* 141*  BUN 15 16  CREATININE 1.14 0.95  CALCIUM 9.7 9.4  AST 28  --   ALT 11  --   ALKPHOS 85  --   BILITOT 0.4  --    ------------------------------------------------------------------------------------------------------------------  Cardiac Enzymes No results for input(s): TROPONINI in the last 168 hours. ------------------------------------------------------------------------------------------------------------------  RADIOLOGY:  No results found.    IMPRESSION AND PLAN:  Principal Problem:   Hyperkalemia  1.  Hyperkalemia, mostly iatrogenic. - The patient will be admitted to a medical telemetry observation bed. - We will stop his potassium chloride - We will stop his olmesartan. - He was aggressively managed for hyperkalemia with IV calcium gluconate, serum bicarbonate, IV Lasix, p.o. Lokelma and albuterol nebulizer. - We will follow serial levels of potassium.  2.  History of DVT and PE on Eliquis. - We will resume Eliquis.  3.  COPD without exacerbation. - We will continue his inhalers.  4.  GERD. - We will continue PPI therapy.  5.  Essential hypertension. - We will continue Cardizem CD.  DVT prophylaxis: Eliquis.   Code Status: full code. Family Communication:  The plan of care was discussed in details with the patient (and family). I answered all questions. The patient agreed to proceed with the above mentioned plan. Further management will depend upon hospital  course. Disposition Plan: Back to previous home environment Consults called: none. All the records are reviewed and case discussed with ED provider.  Status is: Observation   I certify that at the time of admission, it is my clinical judgment that the patient will require inpatient hospital care extending less than 2 midnights.                            Dispo: The patient is from: Home              Anticipated d/c is to: Home              Patient currently is not medically stable to d/c.              Difficult to place patient: No   Christel Mormon M.D on 07/28/2021 at 12:47 PM  [facility]   From 7 PM-7 AM, contact night-coverage [email or web address]  CC: Primary care physician; Tracie Harrier, MD

## 2021-07-28 NOTE — Progress Notes (Unsigned)
Hold treatment today due to critical potassium 6.7.  Patient advised by Dr. Tasia Catchings to go to ED.  Patient stated he would go to ED.  Port left accessed.  ED called with notice that patient is on his way and his port is still accessed. Report given to [facility] in ED.

## 2021-07-28 NOTE — Progress Notes (Signed)
Hematology/Oncology Follow up note [phone] [phone]   Patient Care Team: Tracie Harrier, MD as PCP - General (Internal Medicine) Earlie Server, MD as Consulting Physician (Hematology and Oncology)  REASON FOR VISIT:  Follow up for treatment of squamous lung cancer.   HISTORY OF PRESENTING ILLNESS:  # Dec 2019 Stage I squamous lung cancer #s/p Bronchoscopy on 06/25/2018. subcarina EBUS FNA was non diagnostic, hypocellular specimen.  # 08/13/2018 CT guided right upper lobe biopsy pathology showed dense fibrosis and mixed inflammatory cells with prominent polytypic plasma cells component. Focal benign bronchial wall and alveolar spaces. No malignancy was identified.   # 08/13/2018 underwent right thoracotomy and wedge resection of a right upper lobe mass.  Frozen section was consistent with an inflammatory process.  On the second postop day, preliminary pathology reports high-grade malignancy.  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  08/20/2018 Patient was therefore taken back to the OR and underwent take complete lobectomy  pT1b pN0 cM0 stage I squamous cell lung cancer.  Margin is negative.  Recommend observation.    # 12/03/2018 CT chest w contrast showed local recurrence.  12/10/2018 PET showed No evidence of distant metastatic disease # 12/26/2018 s/p bronchoscopy biopsy. Confirmed local recurrence of squamous cell lung cancer.  medi port placed by Dr.Oaks.  # 06/14/2020 CT chest w contrast was reviewed and discussed with patient.  Evidence of progressive lymphangitic spread of tumor throughout the right lung, with contralateral lung nodules with  right pleural effusion  MRI brain is negative for CNS involvement.  CT abdomen with contrast showed no evidence of abdominal metastatic disease PET scan was not approved by insurance-peer to peer appeal with Dr.Vipul Bhanderi   # NGS - KRAS V14L,  TMB 26.6-high, MS stable, PD-L1 5% CDKN2C G48, DICER1c.2117-1G>T,  FLT4 G1131S, NF1 A443f, NF1 L2643*, STK11 N1850fCancer treatment Started concurrent chemoradiation on 01/16/2019 Carboplatin AUC of 2 and Taxol 45 mg/m2 weekly finished in 03/05/2019 04/10/2019, patient started on durvalumab maintenance. -CT showed progression.  Insurance denied PET scan evaluation. 07/16/2020-09/28/2020 4 cycles of carboplatin/paclitaxel/Keytruda  10/15/2020, CT chest abdomen pelvis redemonstrated postoperative and postradiation appearance of the right chest with perihilar consolidation and fibrosis.  Slight interval decrease in size of the pulmonary nodules associated with interlobular septal thickening nearly complete resolution of previously seen left-sided pulmonary nodules.  Minimal irregular residual.  Right pleural effusion improved.  Consistent with treatment response.  No evidence of metastatic disease with the abdomen or pelvis.  None obstructive bilateral nephrolithiasis - partial response to the treatment- 10/19/2020, Keytruda monotherapy maintenance  12/15/2020, CT chest without contrast showed resolution of lung nodules, no evidence of recurrent disease 12/30/2020, patient was admitted due to unprovoked extensive deep vein thrombosis from calf vein to the common femoral vein on the left.  Patient was started on heparin drip.  Patient underwent thrombolysis and is a colectomy on 12/31/2020.  Anticoagulation was switched to Eliquis at discharge.  04/22/2021, CT chest with contrast showed none occlusive pulmonary embolism, Interval progression of consolidation airspace opacity in the medial right middle lobe with bandlike opacity and bronchiectasis in the perihilar right lung,-this is compatible with evolving posttreatment changes although recurrent disease anteriorly is not excluded. Interval development of numerous bilateral pulmonary nodules, measuring up to 11 mm in the right lung base.  Concerning for metastatic disease.  Tiny right pleural effusion.   04/26/2021, resumed on  carboplatin/Taxol/Keytruda. Patient did not tolerate his chemotherapy of carboplatin/Taxol/Keytruda on 04/26/2021. Patient has developed severe anemia status post PRBC transfusion.  05/25/2021, carboplatin AUC 5 with Keytruda 06/23/2021  continued on carboplatin AUC 5 with Keytruda 07/07/2021 carboplatin with [name]  INTERVAL HISTORY Samuel Choi is a 56 y.o. male who has above history reviewed by me today presents for reatment of  recurrent  squamous cell carcinoma. 07/15/2021 CT chest with contrast showed enlarged right lower lobe thick-walled cavitary lesion in the anterior aspect of the right lower lobe.  Concerning for progression.  Multiple other small pulmonary nodules are also noted elsewhere in the lungs, many of which are stable compared to prior studies.  Several are new or clearly enlarged.  The best example is an enlarging pulmonary nodule in the left upper lobe, 8 x 6 mm comparing to 4 x 3 mm on 04/22/2021.  Patient reports feeling well.  No new complaints.  Chronic intermittent cough.  No hemoptysis.    Review of Systems  Constitutional:  Positive for fatigue and unexpected weight change. Negative for appetite change, chills and fever.  HENT:   Negative for hearing loss and voice change.   Eyes:  Negative for eye problems and icterus.  Respiratory:  Positive for shortness of breath. Negative for chest tightness and cough.   Cardiovascular:  Negative for chest pain and leg swelling.  Gastrointestinal:  Negative for abdominal distention, abdominal pain, nausea and vomiting.  Endocrine: Negative for hot flashes.  Genitourinary:  Negative for difficulty urinating, dysuria and frequency.   Musculoskeletal:  Negative for arthralgias.  Skin:  Negative for itching and rash.  Neurological:  Negative for light-headedness and numbness.  Hematological:  Negative for adenopathy. Does not bruise/bleed easily.  Psychiatric/Behavioral:  Negative for confusion.    MEDICAL HISTORY:  Past  Medical History:  Diagnosis Date   Alcohol abuse    usually drinks 2-3 drinks per day   Atherosclerosis 06/2018   Chronic sinusitis    Dehydration 02/07/2019   Emphysema of lung ([place]) 06/2018   patient unaware of this.   Hip fracture ([place]) 05/2018   no surgery   History of kidney stones 05/2018   per xray, bilateral nephrolitiasis   Hypertension    Squamous cell carcinoma of lung, right ([place]) 06/2018    SURGICAL HISTORY: Past Surgical History:  Procedure Laterality Date   BRAIN SURGERY  10/2017   nasal/sinus endoscopy. mass benign   ELECTROMAGNETIC NAVIGATION BROCHOSCOPY Right 06/25/2018   Procedure: ELECTROMAGNETIC NAVIGATION BRONCHOSCOPY;  Surgeon: [name], MD;  Location: [facility];  Service: Cardiopulmonary;  Laterality: Right;   ENDOBRONCHIAL ULTRASOUND Right 06/25/2018   Procedure: ENDOBRONCHIAL ULTRASOUND;  Surgeon: [name], MD;  Location: [facility];  Service: Cardiopulmonary;  Laterality: Right;   ENDOBRONCHIAL ULTRASOUND Right 12/26/2018   Procedure: ENDOBRONCHIAL ULTRASOUND RIGHT;  Surgeon: [name], MD;  Location: [facility];  Service: Cardiopulmonary;  Laterality: Right;   FLEXIBLE BRONCHOSCOPY N/A 08/20/2018   Procedure: FLEXIBLE BRONCHOSCOPY PREOP;  Surgeon: [name], MD;  Location: [facility];  Service: Thoracic;  Laterality: N/A;   IR CV LINE INJECTION  04/04/2019   NASAL SINUS SURGERY  10/2017   At [facility], frontal sinusotomy, ethmoidectomy, resection anterior cranial fossa neoplasm, turbinate resection   PERIPHERAL VASCULAR THROMBECTOMY Left 12/31/2020   Procedure: PERIPHERAL VASCULAR THROMBECTOMY;  Surgeon: Algernon Huxley, MD;  Location: [place];  Service: Cardiovascular;  Laterality: Left;   PORTACATH PLACEMENT Left 01/15/2019   Procedure: INSERTION PORT-A-CATH;  Surgeon: [name], MD;  Location: [facility];  Service: General;  Laterality: Left;   THORACOTOMY Right 08/13/2018   Procedure: PREOP BROCHOSCOPY WITH RIGHT  THORACOTOMY AND RUL  RESECTION;  Surgeon: [name], MD;  Location: [facility];  Service: General;  Laterality: Right;   THORACOTOMY Right 08/20/2018   Procedure: THORACOTOMY MAJOR RIGHT UPPER LOBE LOBECTOMY;  Surgeon: [name], MD;  Location: [facility];  Service: Thoracic;  Laterality: Right;   TOE SURGERY Left    pin in left toe    SOCIAL HISTORY: Social History   Socioeconomic History   Marital status: Married    Spouse name: lisa   Number of children: Not on file   Years of education: Not on file   Highest education level: Not on file  Occupational History   Occupation: welding    Comment: taking time off to resolve issues  Tobacco Use   Smoking status: Former    Packs/day: 0.50    Years: 15.00    Pack years: 7.50    Types: Cigarettes    Quit date: 08/2018    Years since quitting: 2.9   Smokeless tobacco: Never  Vaping Use   Vaping Use: Never used  Substance and Sexual Activity   Alcohol use: Yes    Alcohol/week: 3.0 standard drinks    Types: 3 Cans of beer per week    Comment: usually 2 drinks per week, per patient   Drug use: No   Sexual activity: Not on file  Other Topics Concern   Not on file  Social History Narrative   Not on file   Social Determinants of Health   Financial Resource Strain: Not on file  Food Insecurity: Not on file  Transportation Needs: Not on file  Physical Activity: Not on file  Stress: Not on file  Social Connections: Not on file  Intimate Partner Violence: Not on file    FAMILY HISTORY: Family History  Problem Relation Age of Onset   Breast cancer Mother    Diabetes Mother    Lung cancer Father    Hypertension Father     ALLERGIES:  has No Known Allergies.  MEDICATIONS:  Current Outpatient Medications  Medication Sig Dispense Refill   albuterol (VENTOLIN HFA) 108 (90 Base) MCG/ACT inhaler INHALE 2 PUFFS BY MOUTH EVERY 6 HOURS AS NEEDED FOR WHEEZE OR SHORTNESS OF BREATH 8.5 g 5   apixaban (ELIQUIS) 5 MG TABS tablet Take 1 tablet (5 mg  total) by mouth 2 (two) times daily. 60 tablet 3   budesonide-formoterol (SYMBICORT) 80-4.5 MCG/ACT inhaler Inhale 2 puffs into the lungs 2 (two) times daily. in the morning and at bedtime. 10.2 each 6   dexamethasone (DECADRON) 4 MG tablet Take 2 tablets (8 mg total) by mouth 2 (two) times daily. Start the day before Taxotere. Then daily after chemo for 2 days. 30 tablet 1   diltiazem (CARDIZEM CD) 120 MG 24 hr capsule Take 120 mg by mouth daily.     dronabinol (MARINOL) 5 MG capsule Take 1 capsule (5 mg total) by mouth 2 (two) times daily before a meal. 60 capsule 0   folic acid (V-R FOLIC ACID) 875 MCG tablet Take 1 tablet (400 mcg total) by mouth daily. 90 tablet 1   hydrALAZINE (APRESOLINE) 100 MG tablet Take 50 mg by mouth 2 (two) times daily.     HYDROcodone-acetaminophen (NORCO/VICODIN) 5-325 MG tablet Take 1 tablet by mouth every 6 (six) hours as needed for severe pain. 10 tablet 0   latanoprost (XALATAN) 0.005 % ophthalmic solution SMARTSIG:In Eye(s)     loratadine (CLARITIN) 10 MG tablet Take 10 mg by mouth daily.  methocarbamol (ROBAXIN) 500 MG tablet Take 1 tablet (500 mg total) by mouth at bedtime. 30 tablet 0   metoprolol succinate (TOPROL-XL) 25 MG 24 hr tablet Take 1 tablet (25 mg total) by mouth daily. 30 tablet 0   nystatin (MYCOSTATIN) 100000 UNIT/ML suspension Take 5 mLs (500,000 Units total) by mouth 4 (four) times daily. Swish and swallow. 473 mL 1   olmesartan (BENICAR) 40 MG tablet Take 40 mg by mouth every other day.      ondansetron (ZOFRAN) 8 MG tablet Take 1 tablet (8 mg total) by mouth every 8 (eight) hours as needed for refractory nausea / vomiting. Start on day 3 after chemo. 90 tablet 1   pantoprazole (PROTONIX) 20 MG tablet Take 1 tablet (20 mg total) by mouth daily. 60 tablet 2   Potassium Chloride ER 20 MEQ TBCR Take 1 tablet by mouth See admin instructions. Take 40 meq in AM and 36mq in PM 60 tablet 2   promethazine (PHENERGAN) 25 MG tablet Take 1 tablet (25  mg total) by mouth every 6 (six) hours as needed for nausea or vomiting. 30 tablet 0   SPIRIVA HANDIHALER 18 MCG inhalation capsule INHALE 1 CAPSULE VIA HANDIHALER ONCE DAILY AT THE SAME TIME EVERY DAY 30 capsule 0   vitamin B-12 (CYANOCOBALAMIN) 1000 MCG tablet Take 1 tablet (1,000 mcg total) by mouth daily. 90 tablet 1   magnesium chloride (SLOW-MAG) 64 MG TBEC SR tablet Take 1 tablet (64 mg total) by mouth 2 (two) times daily. TAKE 1 TABLET (64 MG TOTAL) BY MOUTH DAILY. (Patient not taking: Reported on 07/07/2021) 60 tablet 1   No current facility-administered medications for this visit.   Facility-Administered Medications Ordered in Other Visits  Medication Dose Route Frequency Provider Last Rate Last Admin   dexamethasone (DECADRON) 10 MG/ML injection            heparin lock flush 100 unit/mL  500 Units Intravenous Once YEarlie Server MD       heparin lock flush 100 unit/mL  500 Units Intravenous Once YEarlie Server MD       sodium chloride flush (NS) 0.9 % injection 10 mL  10 mL Intravenous PRN YEarlie Server MD   10 mL at 04/04/19 0903   sodium chloride flush (NS) 0.9 % injection 10 mL  10 mL Intravenous PRN YEarlie Server MD   10 mL at 07/26/20 1308   sodium chloride flush (NS) 0.9 % injection 10 mL  10 mL Intravenous PRN YEarlie Server MD   10 mL at 07/28/21 0828     PHYSICAL EXAMINATION: ECOG PERFORMANCE STATUS: 1 - Symptomatic but completely ambulatory Vitals:   07/28/21 0842  BP: (!) 155/81  Pulse: 91  Temp: (!) 97.2 F (36.2 C)   Filed Weights   07/28/21 0842  Weight: 132 lb 4.8 oz (60 kg)    Physical Exam Constitutional:      General: He is not in acute distress. HENT:     Head: Normocephalic and atraumatic.     Mouth/Throat:     Comments:   Eyes:     General: No scleral icterus. Cardiovascular:     Rate and Rhythm: Normal rate and regular rhythm.     Heart sounds: Normal heart sounds.  Pulmonary:     Effort: Pulmonary effort is normal. No respiratory distress.     Breath sounds:  No wheezing.     Comments: Decreased breath sound bilaterally Abdominal:     General: Bowel sounds are normal. There  is no distension.     Palpations: Abdomen is soft.  Musculoskeletal:        General: No deformity. Normal range of motion.     Cervical back: Normal range of motion and neck supple.  Skin:    General: Skin is warm and dry.     Findings: No erythema or rash.  Neurological:     Mental Status: He is alert and oriented to person, place, and time. Mental status is at baseline.     Cranial Nerves: No cranial nerve deficit.     Coordination: Coordination normal.  Psychiatric:        Mood and Affect: Mood normal.     LABORATORY DATA:  I have reviewed the data as listed Lab Results  Component Value Date   WBC 7.4 07/28/2021   HGB 12.4 (L) 07/28/2021   HCT 36.1 (L) 07/28/2021   MCV 115.7 (H) 07/28/2021   PLT 116 (L) 07/28/2021   Recent Labs    08/31/20 0812 09/07/20 1117 06/23/21 0814 07/07/21 0819 07/28/21 0818  NA 137   < > 137 136 138  K 3.1*   < > 4.0 3.3* 6.0*  CL 100   < > 100 100 108  CO2 25   < > 24 24 18*  GLUCOSE 107*   < > 129* 157* 148*  BUN 7   < > _0 CREATININE 0.79   < > 0.93 0.88 1.14  CALCIUM 8.9   < > 9.2 9.2 9.7  GFRNONAA >60   < > >60 >60 >60  PROT 7.4   < > 8.2* 7.8 8.8*  ALBUMIN 3.4*   < > 3.3* 3.4* 3.8  AST 31   < > _1 ALT 13   < > _2 ALKPHOS 101   < > 80 78 85  BILITOT 2.0*   < > 0.2* 0.5 0.4  BILIDIR 0.4*  --   --   --   --    < > = values in this interval not displayed.    Iron/TIBC/Ferritin/ %Sat No results found for: IRON, TIBC, FERRITIN, IRONPCTSAT   RADIOGRAPHIC STUDIES: I have personally reviewed the radiological images as listed and agreed with the findings in the report. CT Chest W Contrast  Addendum Date: 07/19/2021   ADDENDUM REPORT: 07/19/2021 10:45 ADDENDUM: As described in the original report there is a thick-walled cavitary lesion in the anterior aspect of the right lower lobe which is new  compared to the prior examination from 12/15/2020, and clearly enlarged when compared to the more recent prior study 04/22/2021, highly concerning for a metastatic lesion, particularly in light of the cell type of the patient's primary lesion (squamous cell carcinoma). Multiple other smaller pulmonary nodules are also noted elsewhere in the lungs, many of which are stable compared to prior studies, but several are new or clearly enlarged. The best example of an enlarging pulmonary nodule is in the left upper lobe (axial image 43 of series 3) measuring 8 x 6 mm on today's examination (previously only 4 x 3 mm on 04/22/2021). Electronically Signed   By: Vinnie Langton M.D.   On: 07/19/2021 10:45   Result Date: 07/19/2021 CLINICAL DATA:  Restaging squamous cell carcinoma of the lung. EXAM: CT CHEST WITH CONTRAST TECHNIQUE: Multidetector CT imaging of the chest was performed during intravenous contrast administration. CONTRAST:  53m OMNIPAQUE IOHEXOL 300 MG/ML  SOLN COMPARISON:  12/15/2020 FINDINGS: Cardiovascular: Heart size is within normal  limits. There is a small pericardial effusion, increased from previous exam. Aortic atherosclerosis and coronary artery calcifications. Mediastinum/Nodes: Normal appearance of the thyroid gland. The trachea appears patent and is midline. Normal appearance of the esophagus. No enlarged axillary, supraclavicular, mediastinal, or hilar lymph nodes. Lungs/Pleura: New small right pleural effusion. Bandlike area of fibrosis and masslike architectural distortion extends across the right midlung and is compatible with changes secondary to external beam radiation. There is associated volume loss within the right lung secondary to right upper lobectomy. Within the anterior right lung base there is a new, thick walled cavitary process which measures 4.1 by 2.3 by 3.4 cm, image 91/2 and image 57/4. Small nodule within the lateral right lung base measures 5 mm, image 117/3. New from  previous exam. Subpleural nodule within the posterior right lower lobe measures 4 mm and appears decreased from 5 mm previously, image 55/3. 3 mm left upper lobe lung nodule, image 58/3. Previously 2 mm. New nodule in the periphery of the left midlung measures 4 mm, image 102/3 Upper Abdomen: No acute abnormality. Musculoskeletal: No chest wall abnormality. No acute or significant osseous findings. IMPRESSION: 1. There is a new, thick walled cavitary process within the anterior right lung base measuring up to 4.1 cm. Cannot exclude tumor recurrence. Consider further evaluation with PET-CT. 2. New small right pleural effusion. 3. Several new or enlarging pulmonary nodules are identified within the right lung. Suspicious for metastatic disease. 4. Small pericardial effusion, increased from previous exam. 5. Aortic Atherosclerosis (ICD10-I70.0). Electronically Signed: By: Kerby Moors M.D. On: 07/15/2021 18:19      ASSESSMENT & PLAN:  1. Squamous cell carcinoma of right lung ([place])   2. Encounter for antineoplastic chemotherapy   3. Other acute pulmonary embolism without acute cor pulmonale ([place])   4. Antineoplastic chemotherapy induced anemia   5. Hypomagnesemia   6. Thrombocytopenia ([place])   7. Hyperkalemia    #Recurrent Squamous cell carcinoma of lung, stage IV 07/16/2020-09/28/2020 carboplatin Taxol and Keytruda x 1  partial response -Keytruda maintenance.-04/22/2021, progression-resumed on carboplatin/Taxol/Keytruda - did not tolerate due to myelosuppression.-->  Taxol held, carboplatin/Keytruda--> CT 07/2020 progression Labs reviewed and discussed with patient. CT image findings were reviewed and discussed with patient.  Consistent with progression.  I recommend to switch to docetaxel and ramucirumab.   #Weight loss, continue follow-up with nutritionist.  Continue nutrition supplements.  #Shortness of breath with exertion.  Combination of COPD/emphysema/lung cancer/symptomatic anemia.. Continue  albuterol as needed, Symbicort.  Symptoms are stable.  #Unprovoked left lower extremity DVT, status post thrombolysis/thrombectomy. Nonocclusive PE noted on recent CT. Continue Eliquis 5 mg twice daily.  #Chronic hypomagnesia.  Continue slow magnesium twice daily.   #Chronic hypokalemia, patient is on chronic potassium supplements.  Potassium today was at 6.  Repeat BMP showed potassium level of 6.7.  Hold off chemo.  I asked patient to stop potassium supplementation.  I recommend patient to go to emergency room for further evaluation and management.   return of visit: 1 week lab MD docetaxel/ramucirumab/G-CSF.   Earlie Server, MD, PhD  07/28/2021

## 2021-07-28 NOTE — ED Provider Notes (Signed)
[facility] Provider Note    Event Date/Time   First MD Initiated Contact with Patient 07/28/21 1047     (approximate)   History   Abnormal Lab   HPI  Samuel Choi is a 56 y.o. male who presents to the ED for evaluation of Abnormal Lab   I review outpatient PCP visit from earlier this AM. Pt has a hx of Stage 1 squamous lung cancer. S/p RUL wedge resection and then f/u lobectomy. Chemo/radiation. Mabs. DVT in June 22, started on Eliquis.  Imaging demonstrates progression of local disease without known mets.   Patient presents to the ED from the neighboring cancer center for evaluation of hyperkalemia.  He reports feeling at his baseline and has no complaints.  He was going to get his next round of chemotherapy as regularly scheduled when routine labs demonstrated hyper-K.  Repeat labs later this morning demonstrated worsening up to 6.7, and due to this he was sent to the ED for evaluation.  Physical Exam   Triage Vital Signs: ED Triage Vitals [07/28/21 1052]  Enc Vitals Group     BP 123/76     Pulse Rate 80     Resp 18     Temp 98.7 F (37.1 C)     Temp Source Oral     SpO2 97 %     Weight 133 lb (60.3 kg)     Height 6\' 1"  (1.854 m)     Head Circumference      Peak Flow      Pain Score 0     Pain Loc      Pain Edu?      Excl. in [place]?     Most recent vital signs: Vitals:   07/28/21 1130 07/28/21 1200  BP: 119/82 124/77  Pulse: 74 69  Resp: (!) 24 18  Temp:    SpO2: 96% 98%    General: Awake, no distress.  CV:  Good peripheral perfusion.  Resp:  Normal effort.  Abd:  No distention.  MSK:  No deformity noted.  Neuro:  No focal deficits appreciated. Other:  Port to left-sided chest is accessed and with clean insertion site.  No surrounding hematoma or swelling.   ED Results / Procedures / Treatments   Labs (all labs ordered are listed, but only abnormal results are displayed) Labs Reviewed  CBC WITH DIFFERENTIAL/PLATELET -  Abnormal; Notable for the following components:      Result Value   RBC 2.95 (*)    Hemoglobin 11.8 (*)    HCT 34.1 (*)    MCV 115.6 (*)    MCH 40.0 (*)    RDW 19.5 (*)    Platelets 112 (*)    Lymphs Abs 0.4 (*)    All other components within normal limits  RESP PANEL BY RT-PCR (FLU A&B, COVID) [id number]  CBC WITH DIFFERENTIAL/PLATELET  BASIC METABOLIC PANEL    EKG  Sinus rhythm with a rate of 77 bpm.  Normal axis.  Incomplete right bundle.  Stigmata of LVH.  T waves anteriorly and laterally are quite tall and peaked  RADIOLOGY   Official radiology report(s): No results found.  PROCEDURES and INTERVENTIONS:  .1-3 Lead EKG Interpretation Performed by: Vladimir Crofts, MD Authorized by: Vladimir Crofts, MD     Interpretation: normal     ECG rate:  70   ECG rate assessment: normal     Rhythm: sinus rhythm     Ectopy: none  Conduction: normal   .Critical Care Performed by: Vladimir Crofts, MD Authorized by: Vladimir Crofts, MD   Critical care provider statement:    Critical care time (minutes):  30   Critical care time was exclusive of:  Separately billable procedures and treating other patients   Critical care was necessary to treat or prevent imminent or life-threatening deterioration of the following conditions:  Metabolic crisis   Critical care was time spent personally by me on the following activities:  Development of treatment plan with patient or surrogate, discussions with consultants, evaluation of patient's response to treatment, examination of patient, ordering and review of laboratory studies, ordering and review of radiographic studies, ordering and performing treatments and interventions, pulse oximetry, re-evaluation of patient's condition and review of old charts  Medications  sodium chloride 0.9 % bolus 1,000 mL (has no administration in time range)  insulin aspart (novoLOG) injection 10 Units (has no administration in time range)    And  dextrose 50 % solution  50 mL (has no administration in time range)  furosemide (LASIX) injection 40 mg (40 mg Intravenous Given 07/28/21 1150)  sodium zirconium cyclosilicate (LOKELMA) packet 10 g (10 g Oral Given 07/28/21 1153)  calcium gluconate inj 10% (1 g) URGENT USE ONLY! (1 g Intravenous Given 07/28/21 1143)  albuterol (PROVENTIL) (2.5 MG/3ML) 0.083% nebulizer solution 10 mg (10 mg Nebulization Given 07/28/21 1154)     IMPRESSION / MDM / [place] / ED COURSE  I reviewed the triage vital signs and the nursing notes.  56 year old male with lung cancer getting chemotherapy presents to the ED with hyperkalemia requiring medical admission.  He looks clinically well to me with a reassuring examination.  No distress and no complaints.  I reviewed his outpatient labs with a K of 6.7, and I am concerned by what appear to be peaked and tall T waves on his EKG today.  Having trouble getting metabolic panel but is not hemolyzed here in the ED, but we will go ahead and initiate treatment for hyperkalemia by protocols.  Will consult with hospitalist for admission.      FINAL CLINICAL IMPRESSION(S) / ED DIAGNOSES   Final diagnoses:  Hyperkalemia     Rx / DC Orders   ED Discharge Orders     None        Note:  This document was prepared using Dragon voice recognition software and may include unintentional dictation errors.   Vladimir Crofts, MD 07/28/21 [phone]

## 2021-07-28 NOTE — ED Notes (Signed)
Message sent to [facility], MD regarding potassium 7.5.

## 2021-07-29 DIAGNOSIS — E875 Hyperkalemia: Secondary | ICD-10-CM | POA: Diagnosis not present

## 2021-07-29 LAB — CBC WITH DIFFERENTIAL/PLATELET
Abs Immature Granulocytes: 0.05 10*3/uL (ref 0.00–0.07)
Basophils Absolute: 0 10*3/uL (ref 0.0–0.1)
Basophils Relative: 0 %
Eosinophils Absolute: 0 10*3/uL (ref 0.0–0.5)
Eosinophils Relative: 0 %
HCT: 28.1 % — ABNORMAL LOW (ref 39.0–52.0)
Hemoglobin: 9.9 g/dL — ABNORMAL LOW (ref 13.0–17.0)
Immature Granulocytes: 1 %
Lymphocytes Relative: 6 %
Lymphs Abs: 0.6 10*3/uL — ABNORMAL LOW (ref 0.7–4.0)
MCH: 40.2 pg — ABNORMAL HIGH (ref 26.0–34.0)
MCHC: 35.2 g/dL (ref 30.0–36.0)
MCV: 114.2 fL — ABNORMAL HIGH (ref 80.0–100.0)
Monocytes Absolute: 0.6 10*3/uL (ref 0.1–1.0)
Monocytes Relative: 6 %
Neutro Abs: 9.2 10*3/uL — ABNORMAL HIGH (ref 1.7–7.7)
Neutrophils Relative %: 87 %
Platelets: 96 10*3/uL — ABNORMAL LOW (ref 150–400)
RBC: 2.46 MIL/uL — ABNORMAL LOW (ref 4.22–5.81)
RDW: 19.2 % — ABNORMAL HIGH (ref 11.5–15.5)
WBC: 10.4 10*3/uL (ref 4.0–10.5)
nRBC: 0.3 % — ABNORMAL HIGH (ref 0.0–0.2)

## 2021-07-29 LAB — BASIC METABOLIC PANEL
Anion gap: 7 (ref 5–15)
BUN: 19 mg/dL (ref 6–20)
CO2: 24 mmol/L (ref 22–32)
Calcium: 8.3 mg/dL — ABNORMAL LOW (ref 8.9–10.3)
Chloride: 104 mmol/L (ref 98–111)
Creatinine, Ser: 0.88 mg/dL (ref 0.61–1.24)
GFR, Estimated: >60 mL/min (ref >60)
Glucose, Bld: 124 mg/dL — ABNORMAL HIGH (ref 70–99)
Potassium: 4.2 mmol/L (ref 3.5–5.1)
Sodium: 135 mmol/L (ref 135–145)

## 2021-07-29 MED ORDER — HEPARIN SOD (PORK) LOCK FLUSH 100 UNIT/ML IV SOLN
500.0000 [IU] | Freq: Once | INTRAVENOUS | Status: AC
  Administered 2021-07-29: 12:00:00 500 [IU] via INTRAVENOUS
  Filled 2021-07-29: qty 5

## 2021-07-29 MED ORDER — CHLORHEXIDINE GLUCONATE CLOTH 2 % EX PADS
6.0000 | MEDICATED_PAD | Freq: Every day | CUTANEOUS | Status: DC
  Administered 2021-07-29: 6 via TOPICAL

## 2021-07-29 NOTE — TOC Initial Note (Signed)
Transition of Care [facility]) - Initial/Assessment Note    Patient Details  Name: Samuel Choi MRN: [id number] Date of Birth: 19-Mar-1966  Transition of Care [facility]) CM/SW Contact:    Pete Pelt, RN Phone Number: 07/29/2021, 10:58 AM  Clinical Narrative:      Transition of Care [facility]) Screening Note   Patient Details  Name: Samuel Choi Date of Birth: [birth year]   Transition of Care [facility]) CM/SW Contact:    Pete Pelt, RN Phone Number: 07/29/2021, 10:58 AM    Transition of Care [facility]) has reviewed patient and no TOC needs have been identified at this time. We will continue to monitor patient advancement through interdisciplinary progression rounds. If new patient transition needs arise, please place a TOC consult.                Expected Discharge Plan: Home/Self Care Barriers to Discharge: Barriers Resolved   Patient Goals and CMS Choice     Choice offered to / list presented to : NA  Expected Discharge Plan and Services Expected Discharge Plan: Home/Self Care     Post Acute Care Choice: NA Living arrangements for the past 2 months: Single Family Home Expected Discharge Date: 07/29/21                                    Prior Living Arrangements/Services Living arrangements for the past 2 months: Single Family Home Lives with:: Self, Spouse Patient language and need for interpreter reviewed:: Yes (no interpreter required) Do you feel safe going back to the place where you live?: Yes      Need for Family Participation in Patient Care: Yes (Comment) Care giver support system in place?: Yes (comment)   Criminal Activity/Legal Involvement Pertinent to Current Situation/Hospitalization: No - Comment as needed  Activities of Daily Living Home Assistive Devices/Equipment: None ADL Screening (condition at time of admission) Patient's cognitive ability adequate to safely complete daily activities?: Yes Is the patient deaf or have difficulty  hearing?: No Does the patient have difficulty seeing, even when wearing glasses/contacts?: No Does the patient have difficulty concentrating, remembering, or making decisions?: No Patient able to express need for assistance with ADLs?: Yes Does the patient have difficulty dressing or bathing?: No Independently performs ADLs?: Yes (appropriate for developmental age) Does the patient have difficulty walking or climbing stairs?: No Weakness of Legs: None Weakness of Arms/Hands: None  Permission Sought/Granted Permission sought to share information with : Case Manager                Emotional Assessment Appearance:: Appears stated age Attitude/Demeanor/Rapport: Gracious, Engaged Affect (typically observed): Appropriate, Pleasant Orientation: : Oriented to Self, Oriented to Place, Oriented to  Time, Oriented to Situation Alcohol / Substance Use: Not Applicable Psych Involvement: No (comment)  Admission diagnosis:  Hyperkalemia [E87.5] Patient Active Problem List   Diagnosis Date Noted   Hyperkalemia 07/28/2021   Severe protein-calorie malnutrition ([place]) 06/08/2021   Antineoplastic chemotherapy induced anemia 06/08/2021   Pulmonary embolus ([place]) 06/08/2021   Chemotherapy induced nausea and vomiting 05/17/2021   Symptomatic anemia 05/17/2021   Acute deep vein thrombosis (DVT) of femoral vein of left lower extremity ([place]) 01/11/2021   Wide-complex tachycardia    Acute deep vein thrombosis (DVT) ([place]) 12/30/2020   Chemotherapy induced neutropenia ([place]) 11/09/2020   COPD exacerbation ([place]) 04/22/2020   Cough 03/31/2020   Sinus tachycardia 12/18/2019   Hypomagnesemia 12/10/2019  Encounter for antineoplastic immunotherapy 11/24/2019   Ex-smoker 07/23/2019   Palpitations 06/13/2019   Port-A-Cath in place 04/04/2019   Thrombocytopenia ([place]) 04/04/2019   Folate deficiency 04/04/2019   Hypokalemia 04/04/2019   B12 deficiency 04/04/2019   Alcohol abuse 04/04/2019   Dehydration  02/07/2019   Encounter for antineoplastic chemotherapy 01/31/2019   Squamous cell carcinoma of right lung ([place]) 09/03/2018   Goals of care, counseling/discussion 09/03/2018   Malnutrition of moderate degree 08/23/2018   Lung mass 08/13/2018   Adenopathy    Emphysema of lung ([place]) 06/2018   Allergic rhinitis 06/2018   H/O nasal polyp 02/20/2018   Acute sinusitis 10/10/2017   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

## 2021-07-29 NOTE — Discharge Summary (Signed)
Physician Discharge Summary  Samuel Choi KYH:[id number] DOB: 15-Nov-1965 DOA: 07/28/2021  PCP: Tracie Harrier, MD  Admit date: 07/28/2021 Discharge date: 07/29/2021  Admitted From: Home Disposition: Home  Recommendations for Outpatient Follow-up:  Follow up with PCP within 1 week to recheck potassium. His home potassium supplement was held on discharge.  Follow up with heme/onc for anemia/thrombocytopenia  Discharge Condition:stable, improved CODE STATUS:  Code Status: Full Code  Regular healthy diet  Brief/Interim Summary: Pt presented from home after discovery of elevated potassium at PCP appointment. Initial potassium was 7.5 on presentation and was hemodynamically stable. He was treated with calcium gluconate, albuterol, insulin, lasix, and lokelma. His K+ rapidly corrected and was 4.2 on day of discharge. He was asymptomatic. He was told to hold his home potassium supplement until follow up with PCP.  All other chronic conditions were treated with home medications.   Discharge Diagnoses:  Principal Problem:   Hyperkalemia  Allergies as of 07/29/2021   No Known Allergies      Medication List     STOP taking these medications    Potassium Chloride ER 20 MEQ Tbcr   Slow-Mag 64 MG Tbec SR tablet Generic drug: magnesium chloride       TAKE these medications    albuterol 108 (90 Base) MCG/ACT inhaler Commonly known as: VENTOLIN HFA INHALE 2 PUFFS BY MOUTH EVERY 6 HOURS AS NEEDED FOR WHEEZE OR SHORTNESS OF BREATH   apixaban 5 MG Tabs tablet Commonly known as: Eliquis Take 1 tablet (5 mg total) by mouth 2 (two) times daily.   budesonide-formoterol 80-4.5 MCG/ACT inhaler Commonly known as: Symbicort Inhale 2 puffs into the lungs 2 (two) times daily. in the morning and at bedtime.   dexamethasone 4 MG tablet Commonly known as: DECADRON Take 2 tablets (8 mg total) by mouth 2 (two) times daily. Start the day before Taxotere. Then daily after chemo for 2 days.    diltiazem 120 MG 24 hr capsule Commonly known as: CARDIZEM CD Take 120 mg by mouth daily.   dronabinol 5 MG capsule Commonly known as: Marinol Take 1 capsule (5 mg total) by mouth 2 (two) times daily before a meal.   folic acid 151 MCG tablet Commonly known as: V-R FOLIC ACID Take 1 tablet (400 mcg total) by mouth daily.   hydrALAZINE 100 MG tablet Commonly known as: APRESOLINE Take 50 mg by mouth 2 (two) times daily.   HYDROcodone-acetaminophen 5-325 MG tablet Commonly known as: NORCO/VICODIN Take 1 tablet by mouth every 6 (six) hours as needed for severe pain.   latanoprost 0.005 % ophthalmic solution Commonly known as: XALATAN SMARTSIG:In Eye(s)   loratadine 10 MG tablet Commonly known as: CLARITIN Take 10 mg by mouth daily.   methocarbamol 500 MG tablet Commonly known as: ROBAXIN Take 1 tablet (500 mg total) by mouth at bedtime.   metoprolol succinate 25 MG 24 hr tablet Commonly known as: TOPROL-XL Take 1 tablet (25 mg total) by mouth daily.   nystatin 100000 UNIT/ML suspension Commonly known as: MYCOSTATIN Take 5 mLs (500,000 Units total) by mouth 4 (four) times daily. Swish and swallow.   olmesartan 40 MG tablet Commonly known as: BENICAR Take 40 mg by mouth every other day.   ondansetron 8 MG tablet Commonly known as: Zofran Take 1 tablet (8 mg total) by mouth every 8 (eight) hours as needed for refractory nausea / vomiting. Start on day 3 after chemo.   pantoprazole 20 MG tablet Commonly known as: Protonix Take 1 tablet (  20 mg total) by mouth daily.   promethazine 25 MG tablet Commonly known as: PHENERGAN Take 1 tablet (25 mg total) by mouth every 6 (six) hours as needed for nausea or vomiting.   Spiriva HandiHaler 18 MCG inhalation capsule Generic drug: tiotropium INHALE 1 CAPSULE VIA HANDIHALER ONCE DAILY AT THE SAME TIME EVERY DAY   vitamin B-12 1000 MCG tablet Commonly known as: CYANOCOBALAMIN Take 1 tablet (1,000 mcg total) by mouth  daily.        No Known Allergies  Consultations: none  Procedures/Studies: CT Chest W Contrast  Addendum Date: 07/19/2021   ADDENDUM REPORT: 07/19/2021 10:45 ADDENDUM: As described in the original report there is a thick-walled cavitary lesion in the anterior aspect of the right lower lobe which is new compared to the prior examination from 12/15/2020, and clearly enlarged when compared to the more recent prior study 04/22/2021, highly concerning for a metastatic lesion, particularly in light of the cell type of the patient's primary lesion (squamous cell carcinoma). Multiple other smaller pulmonary nodules are also noted elsewhere in the lungs, many of which are stable compared to prior studies, but several are new or clearly enlarged. The best example of an enlarging pulmonary nodule is in the left upper lobe (axial image 43 of series 3) measuring 8 x 6 mm on today's examination (previously only 4 x 3 mm on 04/22/2021). Electronically Signed   By: Vinnie Langton M.D.   On: 07/19/2021 10:45   Result Date: 07/19/2021 CLINICAL DATA:  Restaging squamous cell carcinoma of the lung. EXAM: CT CHEST WITH CONTRAST TECHNIQUE: Multidetector CT imaging of the chest was performed during intravenous contrast administration. CONTRAST:  28mL OMNIPAQUE IOHEXOL 300 MG/ML  SOLN COMPARISON:  12/15/2020 FINDINGS: Cardiovascular: Heart size is within normal limits. There is a small pericardial effusion, increased from previous exam. Aortic atherosclerosis and coronary artery calcifications. Mediastinum/Nodes: Normal appearance of the thyroid gland. The trachea appears patent and is midline. Normal appearance of the esophagus. No enlarged axillary, supraclavicular, mediastinal, or hilar lymph nodes. Lungs/Pleura: New small right pleural effusion. Bandlike area of fibrosis and masslike architectural distortion extends across the right midlung and is compatible with changes secondary to external beam radiation. There is  associated volume loss within the right lung secondary to right upper lobectomy. Within the anterior right lung base there is a new, thick walled cavitary process which measures 4.1 by 2.3 by 3.4 cm, image 91/2 and image 57/4. Small nodule within the lateral right lung base measures 5 mm, image 117/3. New from previous exam. Subpleural nodule within the posterior right lower lobe measures 4 mm and appears decreased from 5 mm previously, image 55/3. 3 mm left upper lobe lung nodule, image 58/3. Previously 2 mm. New nodule in the periphery of the left midlung measures 4 mm, image 102/3 Upper Abdomen: No acute abnormality. Musculoskeletal: No chest wall abnormality. No acute or significant osseous findings. IMPRESSION: 1. There is a new, thick walled cavitary process within the anterior right lung base measuring up to 4.1 cm. Cannot exclude tumor recurrence. Consider further evaluation with PET-CT. 2. New small right pleural effusion. 3. Several new or enlarging pulmonary nodules are identified within the right lung. Suspicious for metastatic disease. 4. Small pericardial effusion, increased from previous exam. 5. Aortic Atherosclerosis (ICD10-I70.0). Electronically Signed: By: Kerby Moors M.D. On: 07/15/2021 18:19    Subjective: Patient states that he feels well today. He has no complaints or questions at time of discharge. He expressed understanding with plan to  hold his potassium supplement and follow up with his PCP within a week for recheck.   Discharge Exam: Vitals:   07/29/21 0523 07/29/21 0730  BP: 131/78 (!) 148/90  Pulse: 98 98  Resp: 18 16  Temp: 98.8 F (37.1 C) 98.4 F (36.9 C)  SpO2: 97% 97%    General: Pt is alert, awake, not in acute distress Cardiovascular: RRR, S1/S2 +, no rubs, no gallops Respiratory: CTA bilaterally, no wheezing, no rhonchi Abdominal: Soft, NT, ND, bowel sounds + Extremities: no edema, no cyanosis  Labs: Basic Metabolic Panel: Recent Labs  Lab  07/28/21 0818 07/28/21 0905 07/28/21 1138 07/28/21 1502 07/29/21 0527  NA 138 136 138  --  135  K 6.0* 6.7* 7.5* 5.9* 4.2  CL 108 107 109  --  104  CO2 18* 19* 21*  --  24  GLUCOSE 148* 141* 121*  --  124*  BUN 15 16 17   --  19  CREATININE 1.14 0.95 1.04  --  0.88  CALCIUM 9.7 9.4 9.7  --  8.3*   CBC: Recent Labs  Lab 07/28/21 0818 07/28/21 1138 07/29/21 0527  WBC 7.4 8.2 10.4  NEUTROABS 6.8 7.7 9.2*  HGB 12.4* 11.8* 9.9*  HCT 36.1* 34.1* 28.1*  MCV 115.7* 115.6* 114.2*  PLT 116* 112* 96*    Microbiology Recent Results (from the past 240 hour(s))  Resp Panel by RT-PCR (Flu A&B, Covid) Nasopharyngeal Swab     Status: None   Collection Time: 07/28/21 11:10 AM   Specimen: Nasopharyngeal Swab; Nasopharyngeal(NP) swabs in vial transport medium  Result Value Ref Range Status   SARS Coronavirus 2 by RT PCR NEGATIVE NEGATIVE Final    Comment: (NOTE) SARS-CoV-2 target nucleic acids are NOT DETECTED.  The SARS-CoV-2 RNA is generally detectable in upper respiratory specimens during the acute phase of infection. The lowest concentration of SARS-CoV-2 viral copies this assay can detect is 138 copies/mL. A negative result does not preclude SARS-Cov-2 infection and should not be used as the sole basis for treatment or other patient management decisions. A negative result may occur with  improper specimen collection/handling, submission of specimen other than nasopharyngeal swab, presence of viral mutation(s) within the areas targeted by this assay, and inadequate number of viral copies(<138 copies/mL). A negative result must be combined with clinical observations, patient history, and epidemiological information. The expected result is Negative.  Fact Sheet for Patients:  EntrepreneurPulse.com.au  Fact Sheet for Healthcare Providers:  IncredibleEmployment.be  This test is no t yet approved or cleared by the [place] FDA and  has been  authorized for detection and/or diagnosis of SARS-CoV-2 by FDA under an Emergency Use Authorization (EUA). This EUA will remain  in effect (meaning this test can be used) for the duration of the COVID-19 declaration under Section 564(b)(1) of the Act, 21 U.S.C.section 360bbb-3(b)(1), unless the authorization is terminated  or revoked sooner.       Influenza A by PCR NEGATIVE NEGATIVE Final   Influenza B by PCR NEGATIVE NEGATIVE Final    Comment: (NOTE) The Xpert Xpress SARS-CoV-2/FLU/RSV plus assay is intended as an aid in the diagnosis of influenza from Nasopharyngeal swab specimens and should not be used as a sole basis for treatment. Nasal washings and aspirates are unacceptable for Xpert Xpress SARS-CoV-2/FLU/RSV testing.  Fact Sheet for Patients: EntrepreneurPulse.com.au  Fact Sheet for Healthcare Providers: IncredibleEmployment.be  This test is not yet approved or cleared by the [place] FDA and has been authorized for detection and/or diagnosis  of SARS-CoV-2 by FDA under an Emergency Use Authorization (EUA). This EUA will remain in effect (meaning this test can be used) for the duration of the COVID-19 declaration under Section 564(b)(1) of the Act, 21 U.S.C. section 360bbb-3(b)(1), unless the authorization is terminated or revoked.  Performed at [facility], [address] [place], [place] [address]     Time coordinating discharge: Over 30 minutes  Richarda Osmond, MD  [facility] 07/29/2021, 10:08 AM

## 2021-07-29 NOTE — Progress Notes (Signed)
Received MD order to discharge patient to home, reviewed discharge instructions, follow up appointments and home meds with patient and patient verbalized understanding.

## 2021-07-29 NOTE — Progress Notes (Incomplete)
PROGRESS NOTE  Samuel Choi    DOB: 1965-07-31, 56 y.o.  WER:[id number]  PCP: Tracie Harrier, MD   Code Status: Full Code   DOA: 07/28/2021   LOS: 0  Brief Narrative of Current Hospitalization  Samuel Choi is a 56 y.o. male with a PMH significant for ***. They presented from *** to the ED on 07/28/2021 with *** x***days. In the ED, it was found that they had ***. They were treated with ***.  Patient was admitted to medicine service for further workup and management of *** as outlined in detail below.  07/29/21 -***  Assessment & Plan  Principal Problem:   Hyperkalemia  Hyperkalemia, mostly iatrogenic.- resolved. K+ today 4.2- He was aggressively managed for hyperkalemia with IV calcium gluconate, serum bicarbonate, IV Lasix, p.o. Lokelma and albuterol nebulizer. - hold olmesartan. - follow serial levels of potassium.  History of DVT and PE on Eliquis. - resume Eliquis.  COPD without exacerbation. - continue his inhalers.  GERD. -  continue PPI therapy.  Essential hypertension. - continue Cardizem CD.  DVT prophylaxis:  apixaban (ELIQUIS) tablet 5 mg   Diet:  Diet Orders (From admission, onward)     Start     Ordered   07/28/21 1233  Diet Heart Room service appropriate? Yes; Fluid consistency: Thin  Diet effective now       Question Answer Comment  Room service appropriate? Yes   Fluid consistency: Thin      07/28/21 1235            Subjective 07/29/21    Pt reports ***  Disposition Plan & Communication  Patient status: Observation  Admitted From: {From:23814} Disposition: {PLAN; DISPOSITION:26386} Anticipated discharge date: ***  Family Communication: ***  Consults, Procedures, Significant Events  Consultants:  ***  Procedures/significant events:  None***  Antimicrobials:  Anti-infectives (From admission, onward)    None       Objective   Vitals:   07/28/21 2137 07/28/21 2336 07/29/21 0509 07/29/21 0523  BP: 123/81 132/80 (!)  142/89 131/78  Pulse: (!) 106 (!) 104 98 98  Resp: 18 18 18 18   Temp: 98.8 F (37.1 C) 98.5 F (36.9 C) 98.2 F (36.8 C) 98.8 F (37.1 C)  TempSrc: Oral Oral Oral Oral  SpO2: 97% 97% 100% 97%  Weight:      Height:        Intake/Output Summary (Last 24 hours) at 07/29/2021 0719 Last data filed at 07/29/2021 0517 Gross per 24 hour  Intake 590 ml  Output 1800 ml  Net -1210 ml   Filed Weights   07/28/21 1052  Weight: 60.3 kg    Patient BMI: Body mass index is 17.55 kg/m.   Physical Exam: *** General: awake, alert, NAD HEENT: atraumatic, clear conjunctiva, anicteric sclera, MMM, hearing grossly normal Respiratory: normal respiratory effort. Cardiovascular: normal S1/S2, RRR, no JVD, murmurs, quick capillary refill  Gastrointestinal: soft, NT, ND Nervous: A&O x3. no gross focal neurologic deficits, normal speech Extremities: moves all equally, no edema, normal tone Skin: dry, intact, normal temperature, normal color. No rashes, lesions or ulcers on exposed skin Psychiatry: normal mood, congruent affect  Labs   I have personally reviewed following labs and imaging studies Admission on 07/28/2021  Component Date Value Ref Range Status   SARS Coronavirus 2 by RT PCR 07/28/2021 NEGATIVE  NEGATIVE Final   Influenza A by PCR 07/28/2021 NEGATIVE  NEGATIVE Final   Influenza B by PCR 07/28/2021 NEGATIVE  NEGATIVE Final   Sodium  07/28/2021 138  135 - 145 mmol/L Final   Potassium 07/28/2021 7.5 (HH)  3.5 - 5.1 mmol/L Corrected   Chloride 07/28/2021 109  98 - 111 mmol/L Final   CO2 07/28/2021 21 (L)  22 - 32 mmol/L Final   Glucose, Bld 07/28/2021 121 (H)  70 - 99 mg/dL Final   BUN 07/28/2021 17  6 - 20 mg/dL Final   Creatinine, Ser 07/28/2021 1.04  0.61 - 1.24 mg/dL Final   Calcium 07/28/2021 9.7  8.9 - 10.3 mg/dL Final   GFR, Estimated 07/28/2021 >60  >60 mL/min Final   Anion gap 07/28/2021 8  5 - 15 Final   WBC 07/28/2021 8.2  4.0 - 10.5 K/uL Final   RBC 07/28/2021 2.95 (L)   4.22 - 5.81 MIL/uL Final   Hemoglobin 07/28/2021 11.8 (L)  13.0 - 17.0 g/dL Final   HCT 07/28/2021 34.1 (L)  39.0 - 52.0 % Final   MCV 07/28/2021 115.6 (H)  80.0 - 100.0 fL Final   MCH 07/28/2021 40.0 (H)  26.0 - 34.0 pg Final   MCHC 07/28/2021 34.6  30.0 - 36.0 g/dL Final   RDW 07/28/2021 19.5 (H)  11.5 - 15.5 % Final   Platelets 07/28/2021 112 (L)  150 - 400 K/uL Final   nRBC 07/28/2021 0.2  0.0 - 0.2 % Final   Neutrophils Relative % 07/28/2021 93  % Final   Neutro Abs 07/28/2021 7.7  1.7 - 7.7 K/uL Final   Lymphocytes Relative 07/28/2021 5  % Final   Lymphs Abs 07/28/2021 0.4 (L)  0.7 - 4.0 K/uL Final   Monocytes Relative 07/28/2021 1  % Final   Monocytes Absolute 07/28/2021 0.1  0.1 - 1.0 K/uL Final   Eosinophils Relative 07/28/2021 0  % Final   Eosinophils Absolute 07/28/2021 0.0  0.0 - 0.5 K/uL Final   Basophils Relative 07/28/2021 0  % Final   Basophils Absolute 07/28/2021 0.0  0.0 - 0.1 K/uL Final   Immature Granulocytes 07/28/2021 1  % Final   Abs Immature Granulocytes 07/28/2021 0.04  0.00 - 0.07 K/uL Final   Potassium 07/28/2021 5.9 (H)  3.5 - 5.1 mmol/L Final   Glucose-Capillary 07/28/2021 91  70 - 99 mg/dL Final   Sodium 07/29/2021 135  135 - 145 mmol/L Final   Potassium 07/29/2021 4.2  3.5 - 5.1 mmol/L Final   Chloride 07/29/2021 104  98 - 111 mmol/L Final   CO2 07/29/2021 24  22 - 32 mmol/L Final   Glucose, Bld 07/29/2021 124 (H)  70 - 99 mg/dL Final   BUN 07/29/2021 19  6 - 20 mg/dL Final   Creatinine, Ser 07/29/2021 0.88  0.61 - 1.24 mg/dL Final   Calcium 07/29/2021 8.3 (L)  8.9 - 10.3 mg/dL Final   GFR, Estimated 07/29/2021 >60  >60 mL/min Final   Anion gap 07/29/2021 7  5 - 15 Final   WBC 07/29/2021 10.4  4.0 - 10.5 K/uL Final   RBC 07/29/2021 2.46 (L)  4.22 - 5.81 MIL/uL Final   Hemoglobin 07/29/2021 9.9 (L)  13.0 - 17.0 g/dL Final   HCT 07/29/2021 28.1 (L)  39.0 - 52.0 % Final   MCV 07/29/2021 114.2 (H)  80.0 - 100.0 fL Final   MCH 07/29/2021 40.2 (H)  26.0  - 34.0 pg Final   MCHC 07/29/2021 35.2  30.0 - 36.0 g/dL Final   RDW 07/29/2021 19.2 (H)  11.5 - 15.5 % Final   Platelets 07/29/2021 96 (L)  150 - 400 K/uL  Final   nRBC 07/29/2021 0.3 (H)  0.0 - 0.2 % Final   Neutrophils Relative % 07/29/2021 87  % Final   Neutro Abs 07/29/2021 9.2 (H)  1.7 - 7.7 K/uL Final   Lymphocytes Relative 07/29/2021 6  % Final   Lymphs Abs 07/29/2021 0.6 (L)  0.7 - 4.0 K/uL Final   Monocytes Relative 07/29/2021 6  % Final   Monocytes Absolute 07/29/2021 0.6  0.1 - 1.0 K/uL Final   Eosinophils Relative 07/29/2021 0  % Final   Eosinophils Absolute 07/29/2021 0.0  0.0 - 0.5 K/uL Final   Basophils Relative 07/29/2021 0  % Final   Basophils Absolute 07/29/2021 0.0  0.0 - 0.1 K/uL Final   Immature Granulocytes 07/29/2021 1  % Final   Abs Immature Granulocytes 07/29/2021 0.05  0.00 - 0.07 K/uL Final    Imaging Studies  No results found.  Medications   Scheduled Meds:  albuterol  2.5 mg Nebulization Once   apixaban  5 mg Oral BID   diltiazem  120 mg Oral Daily   dronabinol  5 mg Oral BID AC   folic acid  390 mcg Oral Daily   hydrALAZINE  50 mg Oral BID   methocarbamol  500 mg Oral QHS   metoprolol succinate  25 mg Oral Daily   pantoprazole  40 mg Oral Daily   vitamin B-12  1,000 mcg Oral Daily   No recently discontinued medications to reconcile  LOS: 0 days   Richarda Osmond, DO [facility] 07/29/2021, 7:19 AM   Available by Epic secure chat 7AM-7PM. If 7PM-7AM, please contact night-coverage Refer to [email or web address] to contact the [facility] Attending or Consulting provider for this pt

## 2021-07-29 NOTE — Discharge Instructions (Signed)
I have stopped your potassium supplement as your levels were too high. Please follow up with your primary doctor within a week to recheck your potassium level.  They may want to restart a lower dose or keep you off the medication all together.

## 2021-08-03 ENCOUNTER — Encounter: Payer: Self-pay | Admitting: Oncology

## 2021-08-03 ENCOUNTER — Inpatient Hospital Stay: Payer: 59

## 2021-08-03 ENCOUNTER — Other Ambulatory Visit: Payer: Self-pay

## 2021-08-03 ENCOUNTER — Inpatient Hospital Stay (HOSPITAL_BASED_OUTPATIENT_CLINIC_OR_DEPARTMENT_OTHER): Payer: 59 | Admitting: Oncology

## 2021-08-03 VITALS — BP 166/92 | HR 99 | Temp 96.8°F | Wt 134.0 lb

## 2021-08-03 VITALS — BP 129/85 | HR 91 | Resp 20

## 2021-08-03 DIAGNOSIS — Z95828 Presence of other vascular implants and grafts: Secondary | ICD-10-CM

## 2021-08-03 DIAGNOSIS — C3491 Malignant neoplasm of unspecified part of right bronchus or lung: Secondary | ICD-10-CM

## 2021-08-03 DIAGNOSIS — Z5111 Encounter for antineoplastic chemotherapy: Secondary | ICD-10-CM

## 2021-08-03 DIAGNOSIS — Z86718 Personal history of other venous thrombosis and embolism: Secondary | ICD-10-CM | POA: Diagnosis not present

## 2021-08-03 DIAGNOSIS — J438 Other emphysema: Secondary | ICD-10-CM

## 2021-08-03 DIAGNOSIS — T451X5A Adverse effect of antineoplastic and immunosuppressive drugs, initial encounter: Secondary | ICD-10-CM

## 2021-08-03 DIAGNOSIS — Z5112 Encounter for antineoplastic immunotherapy: Secondary | ICD-10-CM | POA: Diagnosis not present

## 2021-08-03 DIAGNOSIS — D6481 Anemia due to antineoplastic chemotherapy: Secondary | ICD-10-CM | POA: Diagnosis not present

## 2021-08-03 LAB — COMPREHENSIVE METABOLIC PANEL
ALT: 12 U/L (ref 0–44)
AST: 17 U/L (ref 15–41)
Albumin: 3.7 g/dL (ref 3.5–5.0)
Alkaline Phosphatase: 81 U/L (ref 38–126)
Anion gap: 10 (ref 5–15)
BUN: 14 mg/dL (ref 6–20)
CO2: 29 mmol/L (ref 22–32)
Calcium: 9.2 mg/dL (ref 8.9–10.3)
Chloride: 102 mmol/L (ref 98–111)
Creatinine, Ser: 0.88 mg/dL (ref 0.61–1.24)
GFR, Estimated: >60 mL/min (ref >60)
Glucose, Bld: 113 mg/dL — ABNORMAL HIGH (ref 70–99)
Potassium: 3.8 mmol/L (ref 3.5–5.1)
Sodium: 141 mmol/L (ref 135–145)
Total Bilirubin: 0.5 mg/dL (ref 0.3–1.2)
Total Protein: 7.9 g/dL (ref 6.5–8.1)

## 2021-08-03 LAB — T4, FREE: Free T4: 1.06 ng/dL (ref 0.61–1.12)

## 2021-08-03 LAB — CBC WITH DIFFERENTIAL/PLATELET
Abs Immature Granulocytes: 0.05 10*3/uL (ref 0.00–0.07)
Basophils Absolute: 0 10*3/uL (ref 0.0–0.1)
Basophils Relative: 0 %
Eosinophils Absolute: 0.1 10*3/uL (ref 0.0–0.5)
Eosinophils Relative: 1 %
HCT: 36.3 % — ABNORMAL LOW (ref 39.0–52.0)
Hemoglobin: 12.7 g/dL — ABNORMAL LOW (ref 13.0–17.0)
Immature Granulocytes: 1 %
Lymphocytes Relative: 12 %
Lymphs Abs: 1.1 10*3/uL (ref 0.7–4.0)
MCH: 39.9 pg — ABNORMAL HIGH (ref 26.0–34.0)
MCHC: 35 g/dL (ref 30.0–36.0)
MCV: 114.2 fL — ABNORMAL HIGH (ref 80.0–100.0)
Monocytes Absolute: 0.9 10*3/uL (ref 0.1–1.0)
Monocytes Relative: 10 %
Neutro Abs: 7 10*3/uL (ref 1.7–7.7)
Neutrophils Relative %: 76 %
Platelets: 193 10*3/uL (ref 150–400)
RBC: 3.18 MIL/uL — ABNORMAL LOW (ref 4.22–5.81)
RDW: 17.2 % — ABNORMAL HIGH (ref 11.5–15.5)
WBC: 9.2 10*3/uL (ref 4.0–10.5)
nRBC: 0 % (ref 0.0–0.2)

## 2021-08-03 LAB — PROTEIN, URINE, RANDOM: Total Protein, Urine: 27 mg/dL

## 2021-08-03 LAB — TSH: TSH: 0.74 u[IU]/mL (ref 0.350–4.500)

## 2021-08-03 MED ORDER — DIPHENHYDRAMINE HCL 50 MG/ML IJ SOLN
50.0000 mg | Freq: Once | INTRAMUSCULAR | Status: AC
  Administered 2021-08-03: 11:00:00 50 mg via INTRAVENOUS
  Filled 2021-08-03: qty 1

## 2021-08-03 MED ORDER — HEPARIN SOD (PORK) LOCK FLUSH 100 UNIT/ML IV SOLN
INTRAVENOUS | Status: AC
  Administered 2021-08-03: 15:00:00 500 [IU]
  Filled 2021-08-03: qty 5

## 2021-08-03 MED ORDER — SODIUM CHLORIDE 0.9 % IV SOLN
Freq: Once | INTRAVENOUS | Status: AC
  Filled 2021-08-03: qty 250

## 2021-08-03 MED ORDER — SODIUM CHLORIDE 0.9 % IV SOLN
10.0000 mg | Freq: Once | INTRAVENOUS | Status: AC
  Administered 2021-08-03: 11:00:00 10 mg via INTRAVENOUS
  Filled 2021-08-03: qty 10

## 2021-08-03 MED ORDER — APIXABAN 2.5 MG PO TABS: 2.5000 mg | ORAL_TABLET | Freq: Two times a day (BID) | ORAL | 3 refills | Status: DC

## 2021-08-03 MED ORDER — ACETAMINOPHEN 325 MG PO TABS
650.0000 mg | ORAL_TABLET | Freq: Once | ORAL | Status: AC
  Administered 2021-08-03: 11:00:00 650 mg via ORAL
  Filled 2021-08-03: qty 2

## 2021-08-03 MED ORDER — SODIUM CHLORIDE 0.9 % IV SOLN
75.0000 mg/m2 | Freq: Once | INTRAVENOUS | Status: AC
  Administered 2021-08-03: 13:00:00 130 mg via INTRAVENOUS
  Filled 2021-08-03: qty 13

## 2021-08-03 MED ORDER — ALTEPLASE 2 MG IJ SOLR
2.0000 mg | Freq: Once | INTRAMUSCULAR | Status: AC | PRN
  Administered 2021-08-03: 10:00:00 2 mg
  Filled 2021-08-03: qty 2

## 2021-08-03 MED ORDER — SODIUM CHLORIDE 0.9 % IV SOLN
10.0000 mg/kg | Freq: Once | INTRAVENOUS | Status: AC
  Administered 2021-08-03: 12:00:00 600 mg via INTRAVENOUS
  Filled 2021-08-03: qty 50

## 2021-08-03 NOTE — Patient Instructions (Signed)
[facility] AT [place]  Discharge Instructions: Thank you for choosing [facility] to provide your oncology and hematology care.  If you have a lab appointment with the [facility], please go directly to the [facility] and check in at the registration area.  Wear comfortable clothing and clothing appropriate for easy access to any Portacath or PICC line.   We strive to give you quality time with your provider. You may need to reschedule your appointment if you arrive late (15 or more minutes).  Arriving late affects you and other patients whose appointments are after yours.  Also, if you miss three or more appointments without notifying the office, you may be dismissed from the clinic at the providers discretion.      For prescription refill requests, have your pharmacy contact our office and allow 72 hours for refills to be completed.    Today you received the following chemotherapy and/or immunotherapy agents taxotere, cyramza      To help prevent nausea and vomiting after your treatment, we encourage you to take your nausea medication as directed.  BELOW ARE SYMPTOMS THAT SHOULD BE REPORTED IMMEDIATELY: *FEVER GREATER THAN 100.4 F (38 C) OR HIGHER *CHILLS OR SWEATING *NAUSEA AND VOMITING THAT IS NOT CONTROLLED WITH YOUR NAUSEA MEDICATION *UNUSUAL SHORTNESS OF BREATH *UNUSUAL BRUISING OR BLEEDING *URINARY PROBLEMS (pain or burning when urinating, or frequent urination) *BOWEL PROBLEMS (unusual diarrhea, constipation, pain near the anus) TENDERNESS IN MOUTH AND THROAT WITH OR WITHOUT PRESENCE OF ULCERS (sore throat, sores in mouth, or a toothache) UNUSUAL RASH, SWELLING OR PAIN  UNUSUAL VAGINAL DISCHARGE OR ITCHING   Items with * indicate a potential emergency and should be followed up as soon as possible or go to the Emergency Department if any problems should occur.  Please show the CHEMOTHERAPY ALERT CARD or IMMUNOTHERAPY ALERT CARD at  check-in to the Emergency Department and triage nurse.  Should you have questions after your visit or need to cancel or reschedule your appointment, please contact [facility] [place] AT [place]  [phone] and follow the prompts.  Office hours are 8:00 a.m. to 4:30 p.m. Monday - Friday. Please note that voicemails left after 4:00 p.m. may not be returned until the following business day.  We are closed weekends and major holidays. You have access to a nurse at all times for urgent questions. Please call the main number to the clinic [phone] and follow the prompts.  For any non-urgent questions, you may also contact your provider using MyChart. We now offer e-Visits for anyone 75 and older to request care online for non-urgent symptoms. For details visit [email or web address].   Also download the MyChart app! Go to the app store, search "MyChart", open the app, select [place], and log in with your MyChart username and password.  Due to Covid, a mask is required upon entering the hospital/clinic. If you do not have a mask, one will be given to you upon arrival. For doctor visits, patients may have 1 support person aged 96 or older with them. For treatment visits, patients cannot have anyone with them due to current Covid guidelines and our immunocompromised population.   Ramucirumab injection What is this medication? RAMUCIRUMAB (ra mue SIR ue mab) is a monoclonal antibody. It is used to treat stomach cancer, colorectal cancer, liver cancer, and lung cancer. This medicine may be used for other purposes; ask your health care provider or pharmacist if you have questions. COMMON BRAND NAME(S): Cyramza What  should I tell my care team before I take this medication? They need to know if you have any of these conditions: bleeding disorders blood clots heart disease, including heart failure, heart attack, or chest pain (angina) high blood pressure infection (especially a virus  infection such as chickenpox, cold sores, or herpes) protein in your urine recent or planning to have surgery stroke an unusual or allergic reaction to ramucirumab, other medicines, foods, dyes, or preservatives pregnant or trying to get pregnant breast-feeding How should I use this medication? This medicine is for infusion into a vein. It is given by a health care professional in a hospital or clinic setting. Talk to your pediatrician regarding the use of this medicine in children. Special care may be needed. Overdosage: If you think you have taken too much of this medicine contact a poison control center or emergency room at once. NOTE: This medicine is only for you. Do not share this medicine with others. What if I miss a dose? It is important not to miss your dose. Call your doctor or health care professional if you are unable to keep an appointment. What may interact with this medication? Interactions have not been studied. This list may not describe all possible interactions. Give your health care provider a list of all the medicines, herbs, non-prescription drugs, or dietary supplements you use. Also tell them if you smoke, drink alcohol, or use illegal drugs. Some items may interact with your medicine. What should I watch for while using this medication? Your condition will be monitored carefully while you are receiving this medicine. You will need to to check your blood pressure and have your blood and urine tested while you are taking this medicine. Your condition will be monitored carefully while you are receiving this medicine. This medicine may increase your risk to bruise or bleed. Call your doctor or health care professional if you notice any unusual bleeding. Before having surgery, talk to your health care provider to make sure it is ok. This drug can increase the risk of poor healing of your surgical site or wound. You will need to stop this drug for 28 days before surgery.  After surgery, wait at least 2 weeks before restarting this drug. Make sure the surgical site or wound is healed enough before restarting this drug. Talk to your health care provider if questions. Do not become pregnant while taking this medicine or for 3 months after stopping it. Women should inform their doctor if they wish to become pregnant or think they might be pregnant. There is a potential for serious side effects to an unborn child. Talk to your health care professional or pharmacist for more information. Do not breast-feed an infant while taking this medicine or for 2 months after stopping it. This medicine may interfere with the ability to have a child. Talk with your doctor or health care professional if you are concerned about your fertility. What side effects may I notice from receiving this medication? Side effects that you should report to your doctor or health care professional as soon as possible: allergic reactions like skin rash, itching or hives, breathing problems, swelling of the face, lips, or tongue signs of infection - fever or chills, cough, sore throat chest pain or chest tightness confusion dizziness feeling faint or lightheaded, falls severe abdominal pain severe nausea, vomiting signs and symptoms of bleeding such as bloody or black, tarry stools; red or dark-brown urine; spitting up blood or brown material that looks like coffee  grounds; red spots on the skin; unusual bruising or bleeding from the eye, gums, or nose signs and symptoms of a blood clot such as breathing problems; changes in vision; chest pain; severe, sudden headache; pain, swelling, warmth in the leg; trouble speaking; sudden numbness or weakness of the face, arm or leg symptoms of a stroke: change in mental awareness, inability to talk or move one side of the body trouble walking, dizziness, loss of balance or coordination Side effects that usually do not require medical attention (report to your  doctor or health care professional if they continue or are bothersome): cold, clammy skin constipation diarrhea headache nausea, vomiting stomach pain unusually slow heartbeat unusually weak or tired This list may not describe all possible side effects. Call your doctor for medical advice about side effects. You may report side effects to FDA at 1-800-FDA-1088. Where should I keep my medication? This drug is given in a hospital or clinic and will not be stored at home. NOTE: This sheet is a summary. It may not cover all possible information. If you have questions about this medicine, talk to your doctor, pharmacist, or health care provider.  2022 Elsevier/Gold Standard (2021-03-15 00:00:00)   Docetaxel injection What is this medication? DOCETAXEL (doe se TAX el) is a chemotherapy drug. It targets fast dividing cells, like cancer cells, and causes these cells to die. This medicine is used to treat many types of cancers like breast cancer, certain stomach cancers, head and neck cancer, lung cancer, and prostate cancer. This medicine may be used for other purposes; ask your health care provider or pharmacist if you have questions. COMMON BRAND NAME(S): Docefrez, Taxotere What should I tell my care team before I take this medication? They need to know if you have any of these conditions: infection (especially a virus infection such as chickenpox, cold sores, or herpes) liver disease low blood counts, like low white cell, platelet, or red cell counts an unusual or allergic reaction to docetaxel, polysorbate 80, other chemotherapy agents, other medicines, foods, dyes, or preservatives pregnant or trying to get pregnant breast-feeding How should I use this medication? This drug is given as an infusion into a vein. It is administered in a hospital or clinic by a specially trained health care professional. Talk to your pediatrician regarding the use of this medicine in children. Special care  may be needed. Overdosage: If you think you have taken too much of this medicine contact a poison control center or emergency room at once. NOTE: This medicine is only for you. Do not share this medicine with others. What if I miss a dose? It is important not to miss your dose. Call your doctor or health care professional if you are unable to keep an appointment. What may interact with this medication? Do not take this medicine with any of the following medications: live virus vaccines This medicine may also interact with the following medications: aprepitant certain antibiotics like erythromycin or clarithromycin certain antivirals for HIV or hepatitis certain medicines for fungal infections like fluconazole, itraconazole, ketoconazole, posaconazole, or voriconazole cimetidine ciprofloxacin conivaptan cyclosporine dronedarone fluvoxamine grapefruit juice imatinib verapamil This list may not describe all possible interactions. Give your health care provider a list of all the medicines, herbs, non-prescription drugs, or dietary supplements you use. Also tell them if you smoke, drink alcohol, or use illegal drugs. Some items may interact with your medicine. What should I watch for while using this medication? Your condition will be monitored carefully while you are  receiving this medicine. You will need important blood work done while you are taking this medicine. Call your doctor or health care professional for advice if you get a fever, chills or sore throat, or other symptoms of a cold or flu. Do not treat yourself. This drug decreases your body's ability to fight infections. Try to avoid being around people who are sick. Some products may contain alcohol. Ask your health care professional if this medicine contains alcohol. Be sure to tell all health care professionals you are taking this medicine. Certain medicines, like metronidazole and disulfiram, can cause an unpleasant reaction when  taken with alcohol. The reaction includes flushing, headache, nausea, vomiting, sweating, and increased thirst. The reaction can last from 30 minutes to several hours. You may get drowsy or dizzy. Do not drive, use machinery, or do anything that needs mental alertness until you know how this medicine affects you. Do not stand or sit up quickly, especially if you are an older patient. This reduces the risk of dizzy or fainting spells. Alcohol may interfere with the effect of this medicine. Talk to your health care professional about your risk of cancer. You may be more at risk for certain types of cancer if you take this medicine. Do not become pregnant while taking this medicine or for 6 months after stopping it. Women should inform their doctor if they wish to become pregnant or think they might be pregnant. There is a potential for serious side effects to an unborn child. Talk to your health care professional or pharmacist for more information. Do not breast-feed an infant while taking this medicine or for 1 week after stopping it. Males who get this medicine must use a condom during sex with females who can get pregnant. If you get a woman pregnant, the baby could have birth defects. The baby could die before they are born. You will need to continue wearing a condom for 3 months after stopping the medicine. Tell your health care provider right away if your partner becomes pregnant while you are taking this medicine. This may interfere with the ability to father a child. You should talk to your doctor or health care professional if you are concerned about your fertility. What side effects may I notice from receiving this medication? Side effects that you should report to your doctor or health care professional as soon as possible: allergic reactions like skin rash, itching or hives, swelling of the face, lips, or tongue blurred vision breathing problems changes in vision low blood counts - This drug  may decrease the number of white blood cells, red blood cells and platelets. You may be at increased risk for infections and bleeding. nausea and vomiting pain, redness or irritation at site where injected pain, tingling, numbness in the hands or feet redness, blistering, peeling, or loosening of the skin, including inside the mouth signs of decreased platelets or bleeding - bruising, pinpoint red spots on the skin, black, tarry stools, nosebleeds signs of decreased red blood cells - unusually weak or tired, fainting spells, lightheadedness signs of infection - fever or chills, cough, sore throat, pain or difficulty passing urine swelling of the ankle, feet, hands Side effects that usually do not require medical attention (report to your doctor or health care professional if they continue or are bothersome): constipation diarrhea fingernail or toenail changes hair loss loss of appetite mouth sores muscle pain This list may not describe all possible side effects. Call your doctor for medical advice about side  effects. You may report side effects to FDA at 1-800-FDA-1088. Where should I keep my medication? This drug is given in a hospital or clinic and will not be stored at home. NOTE: This sheet is a summary. It may not cover all possible information. If you have questions about this medicine, talk to your doctor, pharmacist, or health care provider.  2022 Elsevier/Gold Standard (2021-03-15 00:00:00)

## 2021-08-03 NOTE — Progress Notes (Signed)
BP 166/92 today.  Okay to proceed with treatment per Carl Best CMA per Dr. Tasia Catchings.  Docetaxel titrated per policy for first infusion today.  Pt tolerated all infusions well today with no problems or complaints.  Pt left infusion suite stable and ambulatory.

## 2021-08-03 NOTE — Progress Notes (Signed)
1020 cathflo 1050 positive blood return from port

## 2021-08-03 NOTE — Progress Notes (Signed)
Nutrition Follow-up:   Patient with stage IV squamous cell lung cancer. Patient receiving cyramza and taxotere.  Noted hospitalization for hyperkalemia.  Met with patient during infusion. Patient eating peanut butter nabs during visit.  Reports that appetite is good.  Eating 3 meals per day. Tried all of sample shakes and was able to tolerate.  Yesterday was able to eat 2 eggs with cheese and toaste for breakfast, sandwich at lunch and steak fajitas for dinner last night.  Says he is trying to drink a shake daily.      Medications: reviewed  Labs: reviewed  Anthropometrics:   Weight 134 lb today 131 lb on 12/8 126 lb 1.6 oz on 11/30 132 lb on 11/16 137 lb 8 oz on 8/16 138 lb on 6/14   NUTRITION DIAGNOSIS: Inadequate oral intake improved.    INTERVENTION:  Continue to encourage high calorie, high protein foods Continue oral nutrition supplements.  More samples of Dillard [place] 1.4 given to patient today.      MONITORING, EVALUATION, GOAL: weight trends, intake   NEXT VISIT: as needed  Layann Bluett B. Zenia Resides, [place], [place] Registered Dietitian [phone] (mobile)

## 2021-08-03 NOTE — Progress Notes (Signed)
Hematology/Oncology Follow up note [phone] [phone]   Patient Care Team: Tracie Harrier, MD as PCP - General (Internal Medicine) Earlie Server, MD as Consulting Physician (Hematology and Oncology)  REASON FOR VISIT:  Follow up for treatment of squamous lung cancer.   HISTORY OF PRESENTING ILLNESS:  # Dec 2019 Stage I squamous lung cancer #s/p Bronchoscopy on 06/25/2018. subcarina EBUS FNA was non diagnostic, hypocellular specimen.  # 08/13/2018 CT guided right upper lobe biopsy pathology showed dense fibrosis and mixed inflammatory cells with prominent polytypic plasma cells component. Focal benign bronchial wall and alveolar spaces. No malignancy was identified.   # 08/13/2018 underwent right thoracotomy and wedge resection of a right upper lobe mass.  Frozen section was consistent with an inflammatory process.  On the second postop day, preliminary pathology reports high-grade malignancy.  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  08/20/2018 Patient was therefore taken back to the OR and underwent take complete lobectomy  pT1b pN0 cM0 stage I squamous cell lung cancer.  Margin is negative.  Recommend observation.    # 12/03/2018 CT chest w contrast showed local recurrence.  12/10/2018 PET showed No evidence of distant metastatic disease # 12/26/2018 s/p bronchoscopy biopsy. Confirmed local recurrence of squamous cell lung cancer.  medi port placed by Dr.Oaks.  # 06/14/2020 CT chest w contrast was reviewed and discussed with patient.  Evidence of progressive lymphangitic spread of tumor throughout the right lung, with contralateral lung nodules with  right pleural effusion  MRI brain is negative for CNS involvement.  CT abdomen with contrast showed no evidence of abdominal metastatic disease PET scan was not approved by insurance-peer to peer appeal with Dr.Vipul Bhanderi   # NGS - KRAS V14L,  TMB 26.6-high, MS stable, PD-L1 5% CDKN2C G48, DICER1c.2117-1G>T,  FLT4 G1131S, NF1 A443f, NF1 L2643*, STK11 N1850fCancer treatment Started concurrent chemoradiation on 01/16/2019 Carboplatin AUC of 2 and Taxol 45 mg/m2 weekly finished in 03/05/2019 04/10/2019, patient started on durvalumab maintenance. -CT showed progression.  Insurance denied PET scan evaluation. 07/16/2020-09/28/2020 4 cycles of carboplatin/paclitaxel/Keytruda  10/15/2020, CT chest abdomen pelvis redemonstrated postoperative and postradiation appearance of the right chest with perihilar consolidation and fibrosis.  Slight interval decrease in size of the pulmonary nodules associated with interlobular septal thickening nearly complete resolution of previously seen left-sided pulmonary nodules.  Minimal irregular residual.  Right pleural effusion improved.  Consistent with treatment response.  No evidence of metastatic disease with the abdomen or pelvis.  None obstructive bilateral nephrolithiasis - partial response to the treatment- 10/19/2020, Keytruda monotherapy maintenance  12/15/2020, CT chest without contrast showed resolution of lung nodules, no evidence of recurrent disease 12/30/2020, patient was admitted due to unprovoked extensive deep vein thrombosis from calf vein to the common femoral vein on the left.  Patient was started on heparin drip.  Patient underwent thrombolysis and is a colectomy on 12/31/2020.  Anticoagulation was switched to Eliquis at discharge.  04/22/2021, CT chest with contrast showed none occlusive pulmonary embolism, Interval progression of consolidation airspace opacity in the medial right middle lobe with bandlike opacity and bronchiectasis in the perihilar right lung,-this is compatible with evolving posttreatment changes although recurrent disease anteriorly is not excluded. Interval development of numerous bilateral pulmonary nodules, measuring up to 11 mm in the right lung base.  Concerning for metastatic disease.  Tiny right pleural effusion.   04/26/2021, resumed on  carboplatin/Taxol/Keytruda. Patient did not tolerate his chemotherapy of carboplatin/Taxol/Keytruda on 04/26/2021. Patient has developed severe anemia status post PRBC transfusion.  05/25/2021, carboplatin AUC 5 with Keytruda 06/23/2021  continued on carboplatin AUC 5 with Keytruda 07/07/2021 carboplatin with [name]   07/15/2021 CT chest with contrast showed enlarged right lower lobe thick-walled cavitary lesion in the anterior aspect of the right lower lobe.  Concerning for progression.  Multiple other small pulmonary nodules are also noted elsewhere in the lungs, many of which are stable compared to prior studies.  Several are new or clearly enlarged.  The best example is an enlarging pulmonary nodule in the left upper lobe, 8 x 6 mm comparing to 4 x 3 mm on 04/22/2021.  INTERVAL HISTORY Samuel Choi is a 56 y.o. male who has above history reviewed by me today presents for reatment of  recurrent  squamous cell carcinoma. Last week, chemotherapy was held due to hyperkalemia.  Patient was given calcium gluconate, nebulized albuterol and IV Lasix.  He was observed overnight. potassium improved and patient was discharged.  He has stopped taking potassium supplementation. Today he has no new complaints \   Review of Systems  Constitutional:  Positive for fatigue and unexpected weight change. Negative for appetite change, chills and fever.  HENT:   Negative for hearing loss and voice change.   Eyes:  Negative for eye problems and icterus.  Respiratory:  Positive for shortness of breath. Negative for chest tightness and cough.   Cardiovascular:  Negative for chest pain and leg swelling.  Gastrointestinal:  Negative for abdominal distention, abdominal pain, nausea and vomiting.  Endocrine: Negative for hot flashes.  Genitourinary:  Negative for difficulty urinating, dysuria and frequency.   Musculoskeletal:  Negative for arthralgias.  Skin:  Negative for itching and rash.  Neurological:  Negative  for light-headedness and numbness.  Hematological:  Negative for adenopathy. Does not bruise/bleed easily.  Psychiatric/Behavioral:  Negative for confusion.    MEDICAL HISTORY:  Past Medical History:  Diagnosis Date   Alcohol abuse    usually drinks 2-3 drinks per day   Atherosclerosis 06/2018   Chronic sinusitis    Dehydration 02/07/2019   Emphysema of lung ([place]) 06/2018   patient unaware of this.   Hip fracture ([place]) 05/2018   no surgery   History of kidney stones 05/2018   per xray, bilateral nephrolitiasis   Hypertension    Squamous cell carcinoma of lung, right ([place]) 06/2018    SURGICAL HISTORY: Past Surgical History:  Procedure Laterality Date   BRAIN SURGERY  10/2017   nasal/sinus endoscopy. mass benign   ELECTROMAGNETIC NAVIGATION BROCHOSCOPY Right 06/25/2018   Procedure: ELECTROMAGNETIC NAVIGATION BRONCHOSCOPY;  Surgeon: [name], MD;  Location: [facility];  Service: Cardiopulmonary;  Laterality: Right;   ENDOBRONCHIAL ULTRASOUND Right 06/25/2018   Procedure: ENDOBRONCHIAL ULTRASOUND;  Surgeon: [name], MD;  Location: [facility];  Service: Cardiopulmonary;  Laterality: Right;   ENDOBRONCHIAL ULTRASOUND Right 12/26/2018   Procedure: ENDOBRONCHIAL ULTRASOUND RIGHT;  Surgeon: [name], MD;  Location: [facility];  Service: Cardiopulmonary;  Laterality: Right;   FLEXIBLE BRONCHOSCOPY N/A 08/20/2018   Procedure: FLEXIBLE BRONCHOSCOPY PREOP;  Surgeon: [name], MD;  Location: [facility];  Service: Thoracic;  Laterality: N/A;   IR CV LINE INJECTION  04/04/2019   NASAL SINUS SURGERY  10/2017   At [facility], frontal sinusotomy, ethmoidectomy, resection anterior cranial fossa neoplasm, turbinate resection   PERIPHERAL VASCULAR THROMBECTOMY Left 12/31/2020   Procedure: PERIPHERAL VASCULAR THROMBECTOMY;  Surgeon: Algernon Huxley, MD;  Location: [place] [facility];  Service: Cardiovascular;  Laterality: Left;   PORTACATH PLACEMENT Left 01/15/2019   Procedure:  INSERTION PORT-A-CATH;   Surgeon: [name], MD;  Location: [facility];  Service: General;  Laterality: Left;   THORACOTOMY Right 08/13/2018   Procedure: PREOP BROCHOSCOPY WITH RIGHT THORACOTOMY AND RUL RESECTION;  Surgeon: [name], MD;  Location: [facility];  Service: General;  Laterality: Right;   THORACOTOMY Right 08/20/2018   Procedure: THORACOTOMY MAJOR RIGHT UPPER LOBE LOBECTOMY;  Surgeon: [name], MD;  Location: [facility];  Service: Thoracic;  Laterality: Right;   TOE SURGERY Left    pin in left toe    SOCIAL HISTORY: Social History   Socioeconomic History   Marital status: Married    Spouse name: lisa   Number of children: Not on file   Years of education: Not on file   Highest education level: Not on file  Occupational History   Occupation: welding    Comment: taking time off to resolve issues  Tobacco Use   Smoking status: Former    Packs/day: 0.50    Years: 15.00    Pack years: 7.50    Types: Cigarettes    Quit date: 08/2018    Years since quitting: 2.9   Smokeless tobacco: Never  Vaping Use   Vaping Use: Never used  Substance and Sexual Activity   Alcohol use: Yes    Alcohol/week: 3.0 standard drinks    Types: 3 Cans of beer per week    Comment: usually 2 drinks per week, per patient   Drug use: No   Sexual activity: Not on file  Other Topics Concern   Not on file  Social History Narrative   Not on file   Social Determinants of Health   Financial Resource Strain: Not on file  Food Insecurity: Not on file  Transportation Needs: Not on file  Physical Activity: Not on file  Stress: Not on file  Social Connections: Not on file  Intimate Partner Violence: Not on file    FAMILY HISTORY: Family History  Problem Relation Age of Onset   Breast cancer Mother    Diabetes Mother    Lung cancer Father    Hypertension Father     ALLERGIES:  has No Known Allergies.  MEDICATIONS:  Current Outpatient Medications  Medication Sig Dispense Refill   apixaban (ELIQUIS) 2.5  MG TABS tablet Take 1 tablet (2.5 mg total) by mouth 2 (two) times daily. 60 tablet 3   albuterol (VENTOLIN HFA) 108 (90 Base) MCG/ACT inhaler INHALE 2 PUFFS BY MOUTH EVERY 6 HOURS AS NEEDED FOR WHEEZE OR SHORTNESS OF BREATH 8.5 g 5   budesonide-formoterol (SYMBICORT) 80-4.5 MCG/ACT inhaler Inhale 2 puffs into the lungs 2 (two) times daily. in the morning and at bedtime. 10.2 each 6   dexamethasone (DECADRON) 4 MG tablet Take 2 tablets (8 mg total) by mouth 2 (two) times daily. Start the day before Taxotere. Then daily after chemo for 2 days. 30 tablet 1   diltiazem (CARDIZEM CD) 120 MG 24 hr capsule Take 120 mg by mouth daily.     dronabinol (MARINOL) 5 MG capsule Take 1 capsule (5 mg total) by mouth 2 (two) times daily before a meal. 60 capsule 0   folic acid (V-R FOLIC ACID) 643 MCG tablet Take 1 tablet (400 mcg total) by mouth daily. 90 tablet 1   hydrALAZINE (APRESOLINE) 100 MG tablet Take 50 mg by mouth 2 (two) times daily.     HYDROcodone-acetaminophen (NORCO/VICODIN) 5-325 MG tablet Take 1 tablet by mouth every 6 (six) hours as needed for severe pain. 10  tablet 0   latanoprost (XALATAN) 0.005 % ophthalmic solution SMARTSIG:In Eye(s)     loratadine (CLARITIN) 10 MG tablet Take 10 mg by mouth daily.     methocarbamol (ROBAXIN) 500 MG tablet Take 1 tablet (500 mg total) by mouth at bedtime. 30 tablet 0   metoprolol succinate (TOPROL-XL) 25 MG 24 hr tablet Take 1 tablet (25 mg total) by mouth daily. 30 tablet 0   nystatin (MYCOSTATIN) 100000 UNIT/ML suspension Take 5 mLs (500,000 Units total) by mouth 4 (four) times daily. Swish and swallow. 473 mL 1   olmesartan (BENICAR) 40 MG tablet Take 40 mg by mouth every other day.      ondansetron (ZOFRAN) 8 MG tablet Take 1 tablet (8 mg total) by mouth every 8 (eight) hours as needed for refractory nausea / vomiting. Start on day 3 after chemo. 90 tablet 1   pantoprazole (PROTONIX) 20 MG tablet Take 1 tablet (20 mg total) by mouth daily. 60 tablet 2    promethazine (PHENERGAN) 25 MG tablet Take 1 tablet (25 mg total) by mouth every 6 (six) hours as needed for nausea or vomiting. 30 tablet 0   SPIRIVA HANDIHALER 18 MCG inhalation capsule INHALE 1 CAPSULE VIA HANDIHALER ONCE DAILY AT THE SAME TIME EVERY DAY 30 capsule 0   vitamin B-12 (CYANOCOBALAMIN) 1000 MCG tablet Take 1 tablet (1,000 mcg total) by mouth daily. 90 tablet 1   No current facility-administered medications for this visit.   Facility-Administered Medications Ordered in Other Visits  Medication Dose Route Frequency Provider Last Rate Last Admin   dexamethasone (DECADRON) 10 MG/ML injection            heparin lock flush 100 unit/mL  500 Units Intravenous Once Earlie Server, MD       heparin lock flush 100 unit/mL  500 Units Intravenous Once Earlie Server, MD       sodium chloride flush (NS) 0.9 % injection 10 mL  10 mL Intravenous PRN Earlie Server, MD   10 mL at 04/04/19 0903   sodium chloride flush (NS) 0.9 % injection 10 mL  10 mL Intravenous PRN Earlie Server, MD   10 mL at 07/26/20 1308   sodium chloride flush (NS) 0.9 % injection 10 mL  10 mL Intravenous PRN Earlie Server, MD   10 mL at 07/28/21 0828     PHYSICAL EXAMINATION: ECOG PERFORMANCE STATUS: 1 - Symptomatic but completely ambulatory Vitals:   08/03/21 0919  BP: (!) 166/92  Pulse: 99  Temp: (!) 96.8 F (36 C)   Filed Weights   08/03/21 0919  Weight: 134 lb (60.8 kg)    Physical Exam Constitutional:      General: He is not in acute distress. HENT:     Head: Normocephalic and atraumatic.     Mouth/Throat:     Comments:   Eyes:     General: No scleral icterus. Cardiovascular:     Rate and Rhythm: Normal rate and regular rhythm.     Heart sounds: Normal heart sounds.  Pulmonary:     Effort: Pulmonary effort is normal. No respiratory distress.     Breath sounds: No wheezing.     Comments: Decreased breath sound bilaterally Abdominal:     General: Bowel sounds are normal. There is no distension.     Palpations: Abdomen  is soft.  Musculoskeletal:        General: No deformity. Normal range of motion.     Cervical back: Normal range of motion and neck  supple.  Skin:    General: Skin is warm and dry.     Findings: No erythema or rash.  Neurological:     Mental Status: He is alert and oriented to person, place, and time. Mental status is at baseline.     Cranial Nerves: No cranial nerve deficit.     Coordination: Coordination normal.  Psychiatric:        Mood and Affect: Mood normal.     LABORATORY DATA:  I have reviewed the data as listed Lab Results  Component Value Date   WBC 9.2 08/03/2021   HGB 12.7 (L) 08/03/2021   HCT 36.3 (L) 08/03/2021   MCV 114.2 (H) 08/03/2021   PLT 193 08/03/2021   Recent Labs    08/31/20 0812 09/07/20 1117 07/07/21 0819 07/28/21 0818 07/28/21 0905 07/28/21 1138 07/28/21 1502 07/29/21 0527 08/03/21 0815  NA 137   < > 136 138   < > 138  --  135 141  K 3.1*   < > 3.3* 6.0*   < > 7.5* 5.9* 4.2 3.8  CL 100   < > 100 108   < > 109  --  104 102  CO2 25   < > 24 18*   < > 21*  --  24 29  GLUCOSE 107*   < > 157* 148*   < > 121*  --  124* 113*  BUN 7   < > 11 15   < > 17  --  19 14  CREATININE 0.79   < > 0.88 1.14   < > 1.04  --  0.88 0.88  CALCIUM 8.9   < > 9.2 9.7   < > 9.7  --  8.3* 9.2  GFRNONAA >60   < > >60 >60   < > >60  --  >60 >60  PROT 7.4   < > 7.8 8.8*  --   --   --   --  7.9  ALBUMIN 3.4*   < > 3.4* 3.8  --   --   --   --  3.7  AST 31   < > 19 28  --   --   --   --  17  ALT 13   < > 9 11  --   --   --   --  12  ALKPHOS 101   < > 78 85  --   --   --   --  81  BILITOT 2.0*   < > 0.5 0.4  --   --   --   --  0.5  BILIDIR 0.4*  --   --   --   --   --   --   --   --    < > = values in this interval not displayed.    Iron/TIBC/Ferritin/ %Sat No results found for: IRON, TIBC, FERRITIN, IRONPCTSAT   RADIOGRAPHIC STUDIES: I have personally reviewed the radiological images as listed and agreed with the findings in the report. CT Chest W  Contrast  Addendum Date: 07/19/2021   ADDENDUM REPORT: 07/19/2021 10:45 ADDENDUM: As described in the original report there is a thick-walled cavitary lesion in the anterior aspect of the right lower lobe which is new compared to the prior examination from 12/15/2020, and clearly enlarged when compared to the more recent prior study 04/22/2021, highly concerning for a metastatic lesion, particularly in light of the cell type of the patient's primary lesion (squamous cell  carcinoma). Multiple other smaller pulmonary nodules are also noted elsewhere in the lungs, many of which are stable compared to prior studies, but several are new or clearly enlarged. The best example of an enlarging pulmonary nodule is in the left upper lobe (axial image 43 of series 3) measuring 8 x 6 mm on today's examination (previously only 4 x 3 mm on 04/22/2021). Electronically Signed   By: Vinnie Langton M.D.   On: 07/19/2021 10:45   Result Date: 07/19/2021 CLINICAL DATA:  Restaging squamous cell carcinoma of the lung. EXAM: CT CHEST WITH CONTRAST TECHNIQUE: Multidetector CT imaging of the chest was performed during intravenous contrast administration. CONTRAST:  77m OMNIPAQUE IOHEXOL 300 MG/ML  SOLN COMPARISON:  12/15/2020 FINDINGS: Cardiovascular: Heart size is within normal limits. There is a small pericardial effusion, increased from previous exam. Aortic atherosclerosis and coronary artery calcifications. Mediastinum/Nodes: Normal appearance of the thyroid gland. The trachea appears patent and is midline. Normal appearance of the esophagus. No enlarged axillary, supraclavicular, mediastinal, or hilar lymph nodes. Lungs/Pleura: New small right pleural effusion. Bandlike area of fibrosis and masslike architectural distortion extends across the right midlung and is compatible with changes secondary to external beam radiation. There is associated volume loss within the right lung secondary to right upper lobectomy. Within the  anterior right lung base there is a new, thick walled cavitary process which measures 4.1 by 2.3 by 3.4 cm, image 91/2 and image 57/4. Small nodule within the lateral right lung base measures 5 mm, image 117/3. New from previous exam. Subpleural nodule within the posterior right lower lobe measures 4 mm and appears decreased from 5 mm previously, image 55/3. 3 mm left upper lobe lung nodule, image 58/3. Previously 2 mm. New nodule in the periphery of the left midlung measures 4 mm, image 102/3 Upper Abdomen: No acute abnormality. Musculoskeletal: No chest wall abnormality. No acute or significant osseous findings. IMPRESSION: 1. There is a new, thick walled cavitary process within the anterior right lung base measuring up to 4.1 cm. Cannot exclude tumor recurrence. Consider further evaluation with PET-CT. 2. New small right pleural effusion. 3. Several new or enlarging pulmonary nodules are identified within the right lung. Suspicious for metastatic disease. 4. Small pericardial effusion, increased from previous exam. 5. Aortic Atherosclerosis (ICD10-I70.0). Electronically Signed: By: TKerby MoorsM.D. On: 07/15/2021 18:19      ASSESSMENT & PLAN:  1. Squamous cell carcinoma of right lung ([place]   2. Encounter for antineoplastic chemotherapy   3. Antineoplastic chemotherapy induced anemia   4. Port-A-Cath in place   5. History of thrombosis   6. Hypomagnesemia   7. Other emphysema ([place]    #Recurrent Squamous cell carcinoma of lung, stage IV 07/16/2020-09/28/2020 carboplatin Taxol and Keytruda x 1  partial response -Keytruda maintenance.-04/22/2021, progression-resumed on carboplatin/Taxol/Keytruda - did not tolerate due to myelosuppression.-->  Taxol held, carboplatin/Keytruda--> CT 07/2020 progression --> 08/03/2021 docetaxel and ramucirumab. Labs are reviewed and discussed with patient. Rationale and potential side effects were reviewed in details.  Patient agrees with the plan. Proceed with cycle 1  docetaxel and ramucirumab. Will receive long-acting G-CSF on day 3.  Recommend Claritin daily for 4 days.  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  Macrocytic which is chronic.  Monitor. #Weight loss, continue follow-up with nutritionist.  Continue nutrition supplements.  Weight is stable.  #Shortness of breath with exertion.  Combination of COPD/emphysema/lung cancer/symptomatic anemia.. Continue albuterol as needed, Symbicort.  Symptoms are stable.  #Unprovoked left lower extremity DVT, status post thrombolysis/thrombectomy.  Nonocclusive PE noted on recent CT. Patient has completed 6+ months of therapeutic dose of Eliquis.  Recommend patient to decrease to Eliquis 2.5 mg twice daily.  Prescription sent to pharmacy  #Chronic hypomagnesia.  Continue slow magnesium twice daily.   #Chronic hypokalemia, recent hypokalemia episode.  Currently off potassium supplementation.  Continue monitor. #Port-A-Cath in place, today port has no blood return.  Patient was given Cathflo.  Able to have blood return  return of visit: 1 week lab NP IV fluid Lab MD 3 weeks cycle 2 docetaxel/ramucirumab/G-CSF.   Earlie Server, MD, PhD  08/03/2021

## 2021-08-05 ENCOUNTER — Other Ambulatory Visit: Payer: Self-pay

## 2021-08-05 ENCOUNTER — Inpatient Hospital Stay: Payer: 59

## 2021-08-05 DIAGNOSIS — Z5112 Encounter for antineoplastic immunotherapy: Secondary | ICD-10-CM | POA: Diagnosis not present

## 2021-08-05 DIAGNOSIS — C3491 Malignant neoplasm of unspecified part of right bronchus or lung: Secondary | ICD-10-CM

## 2021-08-05 MED ORDER — PEGFILGRASTIM-BMEZ 6 MG/0.6ML ~~LOC~~ SOSY
6.0000 mg | PREFILLED_SYRINGE | Freq: Once | SUBCUTANEOUS | Status: AC
  Administered 2021-08-05: 6 mg via SUBCUTANEOUS

## 2021-08-15 ENCOUNTER — Other Ambulatory Visit: Payer: Self-pay

## 2021-08-15 ENCOUNTER — Inpatient Hospital Stay: Payer: 59 | Attending: Oncology

## 2021-08-15 ENCOUNTER — Inpatient Hospital Stay (HOSPITAL_BASED_OUTPATIENT_CLINIC_OR_DEPARTMENT_OTHER): Payer: 59 | Admitting: Nurse Practitioner

## 2021-08-15 ENCOUNTER — Inpatient Hospital Stay: Payer: 59

## 2021-08-15 ENCOUNTER — Encounter: Payer: Self-pay | Admitting: Nurse Practitioner

## 2021-08-15 VITALS — BP 129/75 | HR 90 | Temp 97.4°F | Resp 16 | Wt 133.8 lb

## 2021-08-15 DIAGNOSIS — G893 Neoplasm related pain (acute) (chronic): Secondary | ICD-10-CM | POA: Insufficient documentation

## 2021-08-15 DIAGNOSIS — Z7952 Long term (current) use of systemic steroids: Secondary | ICD-10-CM | POA: Insufficient documentation

## 2021-08-15 DIAGNOSIS — C3491 Malignant neoplasm of unspecified part of right bronchus or lung: Secondary | ICD-10-CM | POA: Diagnosis not present

## 2021-08-15 DIAGNOSIS — Z86718 Personal history of other venous thrombosis and embolism: Secondary | ICD-10-CM | POA: Diagnosis not present

## 2021-08-15 DIAGNOSIS — E875 Hyperkalemia: Secondary | ICD-10-CM | POA: Diagnosis not present

## 2021-08-15 DIAGNOSIS — Z7951 Long term (current) use of inhaled steroids: Secondary | ICD-10-CM | POA: Insufficient documentation

## 2021-08-15 DIAGNOSIS — Z5111 Encounter for antineoplastic chemotherapy: Secondary | ICD-10-CM | POA: Insufficient documentation

## 2021-08-15 DIAGNOSIS — D6481 Anemia due to antineoplastic chemotherapy: Secondary | ICD-10-CM | POA: Diagnosis not present

## 2021-08-15 DIAGNOSIS — C3411 Malignant neoplasm of upper lobe, right bronchus or lung: Secondary | ICD-10-CM | POA: Diagnosis present

## 2021-08-15 DIAGNOSIS — Z87891 Personal history of nicotine dependence: Secondary | ICD-10-CM | POA: Diagnosis not present

## 2021-08-15 DIAGNOSIS — Z5112 Encounter for antineoplastic immunotherapy: Secondary | ICD-10-CM | POA: Diagnosis not present

## 2021-08-15 DIAGNOSIS — E860 Dehydration: Secondary | ICD-10-CM

## 2021-08-15 DIAGNOSIS — E871 Hypo-osmolality and hyponatremia: Secondary | ICD-10-CM | POA: Diagnosis not present

## 2021-08-15 DIAGNOSIS — Z803 Family history of malignant neoplasm of breast: Secondary | ICD-10-CM | POA: Insufficient documentation

## 2021-08-15 DIAGNOSIS — Z5189 Encounter for other specified aftercare: Secondary | ICD-10-CM | POA: Insufficient documentation

## 2021-08-15 DIAGNOSIS — Z79899 Other long term (current) drug therapy: Secondary | ICD-10-CM | POA: Insufficient documentation

## 2021-08-15 DIAGNOSIS — Z7901 Long term (current) use of anticoagulants: Secondary | ICD-10-CM | POA: Diagnosis not present

## 2021-08-15 DIAGNOSIS — E876 Hypokalemia: Secondary | ICD-10-CM

## 2021-08-15 DIAGNOSIS — T451X5A Adverse effect of antineoplastic and immunosuppressive drugs, initial encounter: Secondary | ICD-10-CM | POA: Insufficient documentation

## 2021-08-15 DIAGNOSIS — Z801 Family history of malignant neoplasm of trachea, bronchus and lung: Secondary | ICD-10-CM | POA: Diagnosis not present

## 2021-08-15 DIAGNOSIS — Z902 Acquired absence of lung [part of]: Secondary | ICD-10-CM | POA: Insufficient documentation

## 2021-08-15 LAB — CBC WITH DIFFERENTIAL/PLATELET
Abs Immature Granulocytes: 1.59 10*3/uL — ABNORMAL HIGH (ref 0.00–0.07)
Basophils Absolute: 0 10*3/uL (ref 0.0–0.1)
Basophils Relative: 0 %
Eosinophils Absolute: 0 10*3/uL (ref 0.0–0.5)
Eosinophils Relative: 0 %
HCT: 31.7 % — ABNORMAL LOW (ref 39.0–52.0)
Hemoglobin: 11.4 g/dL — ABNORMAL LOW (ref 13.0–17.0)
Immature Granulocytes: 3 %
Lymphocytes Relative: 4 %
Lymphs Abs: 1.7 10*3/uL (ref 0.7–4.0)
MCH: 41 pg — ABNORMAL HIGH (ref 26.0–34.0)
MCHC: 36 g/dL (ref 30.0–36.0)
MCV: 114 fL — ABNORMAL HIGH (ref 80.0–100.0)
Monocytes Absolute: 1.9 10*3/uL — ABNORMAL HIGH (ref 0.1–1.0)
Monocytes Relative: 4 %
Neutro Abs: 43.7 10*3/uL — ABNORMAL HIGH (ref 1.7–7.7)
Neutrophils Relative %: 89 %
Platelets: 90 10*3/uL — ABNORMAL LOW (ref 150–400)
RBC: 2.78 MIL/uL — ABNORMAL LOW (ref 4.22–5.81)
RDW: 14.9 % (ref 11.5–15.5)
Smear Review: DECREASED
WBC: 49 10*3/uL — ABNORMAL HIGH (ref 4.0–10.5)
nRBC: 0.8 % — ABNORMAL HIGH (ref 0.0–0.2)

## 2021-08-15 LAB — COMPREHENSIVE METABOLIC PANEL
ALT: 9 U/L (ref 0–44)
AST: 17 U/L (ref 15–41)
Albumin: 3.4 g/dL — ABNORMAL LOW (ref 3.5–5.0)
Alkaline Phosphatase: 122 U/L (ref 38–126)
Anion gap: 11 (ref 5–15)
BUN: 19 mg/dL (ref 6–20)
CO2: 24 mmol/L (ref 22–32)
Calcium: 8.7 mg/dL — ABNORMAL LOW (ref 8.9–10.3)
Chloride: 97 mmol/L — ABNORMAL LOW (ref 98–111)
Creatinine, Ser: 0.79 mg/dL (ref 0.61–1.24)
GFR, Estimated: >60 mL/min (ref >60)
Glucose, Bld: 134 mg/dL — ABNORMAL HIGH (ref 70–99)
Potassium: 3.3 mmol/L — ABNORMAL LOW (ref 3.5–5.1)
Sodium: 132 mmol/L — ABNORMAL LOW (ref 135–145)
Total Bilirubin: 0.3 mg/dL (ref 0.3–1.2)
Total Protein: 7.2 g/dL (ref 6.5–8.1)

## 2021-08-15 MED ORDER — SODIUM CHLORIDE 0.9 % IV SOLN
Freq: Once | INTRAVENOUS | Status: AC
  Filled 2021-08-15: qty 250

## 2021-08-15 MED ORDER — POTASSIUM CHLORIDE CRYS ER 20 MEQ PO TBCR: 20.0000 meq | EXTENDED_RELEASE_TABLET | Freq: Every day | ORAL | 0 refills | Status: DC

## 2021-08-15 MED ORDER — HYDROCODONE-ACETAMINOPHEN 5-325 MG PO TABS
1.0000 | ORAL_TABLET | Freq: Three times a day (TID) | ORAL | 0 refills | Status: DC | PRN
Start: 2021-08-15 — End: 2021-10-12

## 2021-08-15 MED ORDER — HEPARIN SOD (PORK) LOCK FLUSH 100 UNIT/ML IV SOLN
500.0000 [IU] | Freq: Once | INTRAVENOUS | Status: AC | PRN
  Administered 2021-08-15: 500 [IU]
  Filled 2021-08-15: qty 5

## 2021-08-15 MED ORDER — SODIUM CHLORIDE 0.9% FLUSH
10.0000 mL | Freq: Once | INTRAVENOUS | Status: AC | PRN
  Administered 2021-08-15: 10 mL
  Filled 2021-08-15: qty 10

## 2021-08-15 NOTE — Progress Notes (Signed)
Pt received 1 L NS over 1 hour. Eating and drinking well according to patient. Discharged to home.

## 2021-08-15 NOTE — Progress Notes (Signed)
Hematology/Oncology Follow up note [phone] [phone]   Patient Care Team: Tracie Harrier, MD as PCP - General (Internal Medicine) Earlie Server, MD as Consulting Physician (Hematology and Oncology)  REASON FOR VISIT:  Follow up for treatment of squamous lung cancer  HISTORY OF PRESENTING ILLNESS:  # Dec 2019 Stage I squamous lung cancer #s/p Bronchoscopy on 06/25/2018. subcarina EBUS FNA was non diagnostic, hypocellular specimen.  # 08/13/2018 CT guided right upper lobe biopsy pathology showed dense fibrosis and mixed inflammatory cells with prominent polytypic plasma cells component. Focal benign bronchial wall and alveolar spaces. No malignancy was identified.   # 08/13/2018 underwent right thoracotomy and wedge resection of a right upper lobe mass.  Frozen section was consistent with an inflammatory process.  On the second postop day, preliminary pathology reports high-grade malignancy.  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  08/20/2018 Patient was therefore taken back to the OR and underwent take complete lobectomy  pT1b pN0 cM0 stage I squamous cell lung cancer.  Margin is negative.  Recommend observation.    # 12/03/2018 CT chest w contrast showed local recurrence.  12/10/2018 PET showed No evidence of distant metastatic disease # 12/26/2018 s/p bronchoscopy biopsy. Confirmed local recurrence of squamous cell lung cancer.  medi port placed by Dr.Oaks.  # 06/14/2020 CT chest w contrast was reviewed and discussed with patient.  Evidence of progressive lymphangitic spread of tumor throughout the right lung, with contralateral lung nodules with  right pleural effusion  MRI brain is negative for CNS involvement.  CT abdomen with contrast showed no evidence of abdominal metastatic disease PET scan was not approved by insurance-peer to peer appeal with Dr.Vipul Bhanderi   # NGS - KRAS V14L,  TMB 26.6-high, MS stable, PD-L1 5% CDKN2C G48, DICER1c.2117-1G>T,  FLT4 G1131S, NF1 A492f, NF1 L2643*, STK11 N1834fCancer treatment Started concurrent chemoradiation on 01/16/2019 Carboplatin AUC of 2 and Taxol 45 mg/m2 weekly finished in 03/05/2019 04/10/2019, patient started on durvalumab maintenance. -CT showed progression.  Insurance denied PET scan evaluation. 07/16/2020-09/28/2020 4 cycles of carboplatin/paclitaxel/Keytruda  10/15/2020, CT chest abdomen pelvis redemonstrated postoperative and postradiation appearance of the right chest with perihilar consolidation and fibrosis.  Slight interval decrease in size of the pulmonary nodules associated with interlobular septal thickening nearly complete resolution of previously seen left-sided pulmonary nodules.  Minimal irregular residual.  Right pleural effusion improved.  Consistent with treatment response.  No evidence of metastatic disease with the abdomen or pelvis.  None obstructive bilateral nephrolithiasis - partial response to the treatment- 10/19/2020, Keytruda monotherapy maintenance  12/15/2020, CT chest without contrast showed resolution of lung nodules, no evidence of recurrent disease 12/30/2020, patient was admitted due to unprovoked extensive deep vein thrombosis from calf vein to the common femoral vein on the left.  Patient was started on heparin drip.  Patient underwent thrombolysis and is a colectomy on 12/31/2020.  Anticoagulation was switched to Eliquis at discharge.  04/22/2021, CT chest with contrast showed none occlusive pulmonary embolism, Interval progression of consolidation airspace opacity in the medial right middle lobe with bandlike opacity and bronchiectasis in the perihilar right lung,-this is compatible with evolving posttreatment changes although recurrent disease anteriorly is not excluded. Interval development of numerous bilateral pulmonary nodules, measuring up to 11 mm in the right lung base.  Concerning for metastatic disease.  Tiny right pleural effusion.   04/26/2021, resumed on  carboplatin/Taxol/Keytruda. Patient did not tolerate his chemotherapy of carboplatin/Taxol/Keytruda on 04/26/2021. Patient has developed severe anemia status post PRBC transfusion. 05/25/2021,  carboplatin AUC 5 with Keytruda 06/23/2021  continued on carboplatin AUC 5 with Keytruda 07/07/2021 carboplatin with [name]   07/15/2021 CT chest with contrast showed enlarged right lower lobe thick-walled cavitary lesion in the anterior aspect of the right lower lobe.  Concerning for progression.  Multiple other small pulmonary nodules are also noted elsewhere in the lungs, many of which are stable compared to prior studies.  Several are new or clearly enlarged.  The best example is an enlarging pulmonary nodule in the left upper lobe, 8 x 6 mm comparing to 4 x 3 mm on 04/22/2021.  INTERVAL HISTORY Samuel Choi is a 56 y.o. male who has above history reviewed by me today presents for follow up after chemotherapy for treatment of lung cancer. Receiving second line cyramza and taxotere with GCSF support. Recent hospitalization for hyperkalemia and hypocalcemia. Additionally, has had low magnesium and is taking slow mag twice daily. He requests refill of pain medication which controls pain.    Review of Systems  Constitutional:  Negative for appetite change, fatigue and unexpected weight change.  HENT:   Negative for mouth sores, sore throat and trouble swallowing.   Respiratory:  Negative for chest tightness and shortness of breath.   Cardiovascular:  Negative for leg swelling.  Gastrointestinal:  Negative for abdominal pain, constipation, diarrhea, nausea and vomiting.  Genitourinary:  Negative for bladder incontinence and dysuria.   Musculoskeletal:  Negative for flank pain and neck stiffness.  Skin:  Negative for itching, rash and wound.  Neurological:  Negative for dizziness, headaches, light-headedness and numbness.  Psychiatric/Behavioral:  Negative for confusion, depression and sleep disturbance.  The patient is not nervous/anxious.    MEDICAL HISTORY:  Past Medical History:  Diagnosis Date   Alcohol abuse    usually drinks 2-3 drinks per day   Atherosclerosis 06/2018   Chronic sinusitis    Dehydration 02/07/2019   Emphysema of lung ([place]) 06/2018   patient unaware of this.   Hip fracture ([place]) 05/2018   no surgery   History of kidney stones 05/2018   per xray, bilateral nephrolitiasis   Hypertension    Squamous cell carcinoma of lung, right ([place]) 06/2018    SURGICAL HISTORY: Past Surgical History:  Procedure Laterality Date   BRAIN SURGERY  10/2017   nasal/sinus endoscopy. mass benign   ELECTROMAGNETIC NAVIGATION BROCHOSCOPY Right 06/25/2018   Procedure: ELECTROMAGNETIC NAVIGATION BRONCHOSCOPY;  Surgeon: [name], MD;  Location: [facility];  Service: Cardiopulmonary;  Laterality: Right;   ENDOBRONCHIAL ULTRASOUND Right 06/25/2018   Procedure: ENDOBRONCHIAL ULTRASOUND;  Surgeon: [name], MD;  Location: [facility];  Service: Cardiopulmonary;  Laterality: Right;   ENDOBRONCHIAL ULTRASOUND Right 12/26/2018   Procedure: ENDOBRONCHIAL ULTRASOUND RIGHT;  Surgeon: [name], MD;  Location: [facility];  Service: Cardiopulmonary;  Laterality: Right;   FLEXIBLE BRONCHOSCOPY N/A 08/20/2018   Procedure: FLEXIBLE BRONCHOSCOPY PREOP;  Surgeon: [name], MD;  Location: [facility];  Service: Thoracic;  Laterality: N/A;   IR CV LINE INJECTION  04/04/2019   NASAL SINUS SURGERY  10/2017   At [facility], frontal sinusotomy, ethmoidectomy, resection anterior cranial fossa neoplasm, turbinate resection   PERIPHERAL VASCULAR THROMBECTOMY Left 12/31/2020   Procedure: PERIPHERAL VASCULAR THROMBECTOMY;  Surgeon: Algernon Huxley, MD;  Location: [place] [facility];  Service: Cardiovascular;  Laterality: Left;   PORTACATH PLACEMENT Left 01/15/2019   Procedure: INSERTION PORT-A-CATH;  Surgeon: [name], MD;  Location: [facility];  Service: General;  Laterality: Left;   THORACOTOMY Right 08/13/2018    Procedure:  PREOP BROCHOSCOPY WITH RIGHT THORACOTOMY AND RUL RESECTION;  Surgeon: [name], MD;  Location: [facility];  Service: General;  Laterality: Right;   THORACOTOMY Right 08/20/2018   Procedure: THORACOTOMY MAJOR RIGHT UPPER LOBE LOBECTOMY;  Surgeon: [name], MD;  Location: [facility];  Service: Thoracic;  Laterality: Right;   TOE SURGERY Left    pin in left toe    SOCIAL HISTORY: Social History   Socioeconomic History   Marital status: Married    Spouse name: lisa   Number of children: Not on file   Years of education: Not on file   Highest education level: Not on file  Occupational History   Occupation: welding    Comment: taking time off to resolve issues  Tobacco Use   Smoking status: Former    Packs/day: 0.50    Years: 15.00    Pack years: 7.50    Types: Cigarettes    Quit date: 08/2018    Years since quitting: 3.0   Smokeless tobacco: Never  Vaping Use   Vaping Use: Never used  Substance and Sexual Activity   Alcohol use: Yes    Alcohol/week: 3.0 standard drinks    Types: 3 Cans of beer per week    Comment: usually 2 drinks per week, per patient   Drug use: No   Sexual activity: Not on file  Other Topics Concern   Not on file  Social History Narrative   Not on file   Social Determinants of Health   Financial Resource Strain: Not on file  Food Insecurity: Not on file  Transportation Needs: Not on file  Physical Activity: Not on file  Stress: Not on file  Social Connections: Not on file  Intimate Partner Violence: Not on file    FAMILY HISTORY: Family History  Problem Relation Age of Onset   Breast cancer Mother    Diabetes Mother    Lung cancer Father    Hypertension Father     ALLERGIES:  has No Known Allergies.  MEDICATIONS:  Current Outpatient Medications  Medication Sig Dispense Refill   albuterol (VENTOLIN HFA) 108 (90 Base) MCG/ACT inhaler INHALE 2 PUFFS BY MOUTH EVERY 6 HOURS AS NEEDED FOR WHEEZE OR SHORTNESS OF BREATH 8.5 g 5    apixaban (ELIQUIS) 2.5 MG TABS tablet Take 1 tablet (2.5 mg total) by mouth 2 (two) times daily. 60 tablet 3   budesonide-formoterol (SYMBICORT) 80-4.5 MCG/ACT inhaler Inhale 2 puffs into the lungs 2 (two) times daily. in the morning and at bedtime. 10.2 each 6   dexamethasone (DECADRON) 4 MG tablet Take 2 tablets (8 mg total) by mouth 2 (two) times daily. Start the day before Taxotere. Then daily after chemo for 2 days. 30 tablet 1   diltiazem (CARDIZEM CD) 120 MG 24 hr capsule Take 120 mg by mouth daily.     dronabinol (MARINOL) 5 MG capsule Take 1 capsule (5 mg total) by mouth 2 (two) times daily before a meal. 60 capsule 0   folic acid (V-R FOLIC ACID) 502 MCG tablet Take 1 tablet (400 mcg total) by mouth daily. 90 tablet 1   HYDROcodone-acetaminophen (NORCO/VICODIN) 5-325 MG tablet Take 1 tablet by mouth every 6 (six) hours as needed for severe pain. 10 tablet 0   latanoprost (XALATAN) 0.005 % ophthalmic solution SMARTSIG:In Eye(s)     loratadine (CLARITIN) 10 MG tablet Take 10 mg by mouth daily.     methocarbamol (ROBAXIN) 500 MG tablet Take 1 tablet (500 mg total) by mouth  at bedtime. 30 tablet 0   metoprolol succinate (TOPROL-XL) 25 MG 24 hr tablet Take 1 tablet (25 mg total) by mouth daily. 30 tablet 0   nystatin (MYCOSTATIN) 100000 UNIT/ML suspension Take 5 mLs (500,000 Units total) by mouth 4 (four) times daily. Swish and swallow. 473 mL 1   olmesartan (BENICAR) 40 MG tablet Take 40 mg by mouth every other day.      pantoprazole (PROTONIX) 20 MG tablet Take 1 tablet (20 mg total) by mouth daily. 60 tablet 2   SPIRIVA HANDIHALER 18 MCG inhalation capsule INHALE 1 CAPSULE VIA HANDIHALER ONCE DAILY AT THE SAME TIME EVERY DAY 30 capsule 0   vitamin B-12 (CYANOCOBALAMIN) 1000 MCG tablet Take 1 tablet (1,000 mcg total) by mouth daily. 90 tablet 1   hydrALAZINE (APRESOLINE) 100 MG tablet Take 50 mg by mouth 2 (two) times daily. (Patient not taking: Reported on 08/15/2021)     ondansetron  (ZOFRAN) 8 MG tablet Take 1 tablet (8 mg total) by mouth every 8 (eight) hours as needed for refractory nausea / vomiting. Start on day 3 after chemo. (Patient not taking: Reported on 08/15/2021) 90 tablet 1   promethazine (PHENERGAN) 25 MG tablet Take 1 tablet (25 mg total) by mouth every 6 (six) hours as needed for nausea or vomiting. (Patient not taking: Reported on 08/15/2021) 30 tablet 0   No current facility-administered medications for this visit.   Facility-Administered Medications Ordered in Other Visits  Medication Dose Route Frequency Provider Last Rate Last Admin   dexamethasone (DECADRON) 10 MG/ML injection            heparin lock flush 100 unit/mL  500 Units Intravenous Once Earlie Server, MD       heparin lock flush 100 unit/mL  500 Units Intravenous Once Earlie Server, MD       heparin lock flush 100 unit/mL  500 Units Intracatheter Once PRN Earlie Server, MD       sodium chloride flush (NS) 0.9 % injection 10 mL  10 mL Intravenous PRN Earlie Server, MD   10 mL at 04/04/19 0903   sodium chloride flush (NS) 0.9 % injection 10 mL  10 mL Intravenous PRN Earlie Server, MD   10 mL at 07/26/20 1308   sodium chloride flush (NS) 0.9 % injection 10 mL  10 mL Intravenous PRN Earlie Server, MD   10 mL at 07/28/21 0828     PHYSICAL EXAMINATION: ECOG PERFORMANCE STATUS: 1 - Symptomatic but completely ambulatory Vitals:   08/15/21 0830  BP: 129/75  Pulse: 90  Resp: 16  Temp: (!) 97.4 F (36.3 C)  SpO2: 98%   Filed Weights   08/15/21 0830  Weight: 133 lb 12.8 oz (60.7 kg)    Physical Exam Constitutional:      General: He is not in acute distress.    Appearance: He is well-developed.  HENT:     Head: Normocephalic and atraumatic.     Mouth/Throat:     Pharynx: No oropharyngeal exudate.  Eyes:     General: No scleral icterus.    Conjunctiva/sclera: Conjunctivae normal.  Cardiovascular:     Rate and Rhythm: Normal rate and regular rhythm.  Pulmonary:     Effort: Pulmonary effort is normal.     Breath  sounds: No wheezing.     Comments: diminished Abdominal:     General: Bowel sounds are normal. There is no distension.     Palpations: Abdomen is soft.     Tenderness: There is  no abdominal tenderness.  Musculoskeletal:     Cervical back: Normal range of motion and neck supple.  Skin:    General: Skin is warm and dry.  Neurological:     Mental Status: He is alert and oriented to person, place, and time.  Psychiatric:        Behavior: Behavior normal.     LABORATORY DATA:  I have reviewed the data as listed Lab Results  Component Value Date   WBC 9.2 08/03/2021   HGB 12.7 (L) 08/03/2021   HCT 36.3 (L) 08/03/2021   MCV 114.2 (H) 08/03/2021   PLT 193 08/03/2021   Recent Labs    08/31/20 0812 09/07/20 1117 07/07/21 0819 07/28/21 0818 07/28/21 0905 07/28/21 1138 07/28/21 1502 07/29/21 0527 08/03/21 0815  NA 137   < > 136 138   < > 138  --  135 141  K 3.1*   < > 3.3* 6.0*   < > 7.5* 5.9* 4.2 3.8  CL 100   < > 100 108   < > 109  --  104 102  CO2 25   < > 24 18*   < > 21*  --  24 29  GLUCOSE 107*   < > 157* 148*   < > 121*  --  124* 113*  BUN 7   < > 11 15   < > 17  --  19 14  CREATININE 0.79   < > 0.88 1.14   < > 1.04  --  0.88 0.88  CALCIUM 8.9   < > 9.2 9.7   < > 9.7  --  8.3* 9.2  GFRNONAA >60   < > >60 >60   < > >60  --  >60 >60  PROT 7.4   < > 7.8 8.8*  --   --   --   --  7.9  ALBUMIN 3.4*   < > 3.4* 3.8  --   --   --   --  3.7  AST 31   < > 19 28  --   --   --   --  17  ALT 13   < > 9 11  --   --   --   --  12  ALKPHOS 101   < > 78 85  --   --   --   --  81  BILITOT 2.0*   < > 0.5 0.4  --   --   --   --  0.5  BILIDIR 0.4*  --   --   --   --   --   --   --   --    < > = values in this interval not displayed.    Iron/TIBC/Ferritin/ %Sat No results found for: IRON, TIBC, FERRITIN, IRONPCTSAT   RADIOGRAPHIC STUDIES: I have personally reviewed the radiological images as listed and agreed with the findings in the report. CT Chest W Contrast  Addendum Date:  07/19/2021   ADDENDUM REPORT: 07/19/2021 10:45 ADDENDUM: As described in the original report there is a thick-walled cavitary lesion in the anterior aspect of the right lower lobe which is new compared to the prior examination from 12/15/2020, and clearly enlarged when compared to the more recent prior study 04/22/2021, highly concerning for a metastatic lesion, particularly in light of the cell type of the patient's primary lesion (squamous cell carcinoma). Multiple other smaller pulmonary nodules are also noted elsewhere in the lungs, many of which  are stable compared to prior studies, but several are new or clearly enlarged. The best example of an enlarging pulmonary nodule is in the left upper lobe (axial image 43 of series 3) measuring 8 x 6 mm on today's examination (previously only 4 x 3 mm on 04/22/2021). Electronically Signed   By: Vinnie Langton M.D.   On: 07/19/2021 10:45   Result Date: 07/19/2021 CLINICAL DATA:  Restaging squamous cell carcinoma of the lung. EXAM: CT CHEST WITH CONTRAST TECHNIQUE: Multidetector CT imaging of the chest was performed during intravenous contrast administration. CONTRAST:  13m OMNIPAQUE IOHEXOL 300 MG/ML  SOLN COMPARISON:  12/15/2020 FINDINGS: Cardiovascular: Heart size is within normal limits. There is a small pericardial effusion, increased from previous exam. Aortic atherosclerosis and coronary artery calcifications. Mediastinum/Nodes: Normal appearance of the thyroid gland. The trachea appears patent and is midline. Normal appearance of the esophagus. No enlarged axillary, supraclavicular, mediastinal, or hilar lymph nodes. Lungs/Pleura: New small right pleural effusion. Bandlike area of fibrosis and masslike architectural distortion extends across the right midlung and is compatible with changes secondary to external beam radiation. There is associated volume loss within the right lung secondary to right upper lobectomy. Within the anterior right lung base there is  a new, thick walled cavitary process which measures 4.1 by 2.3 by 3.4 cm, image 91/2 and image 57/4. Small nodule within the lateral right lung base measures 5 mm, image 117/3. New from previous exam. Subpleural nodule within the posterior right lower lobe measures 4 mm and appears decreased from 5 mm previously, image 55/3. 3 mm left upper lobe lung nodule, image 58/3. Previously 2 mm. New nodule in the periphery of the left midlung measures 4 mm, image 102/3 Upper Abdomen: No acute abnormality. Musculoskeletal: No chest wall abnormality. No acute or significant osseous findings. IMPRESSION: 1. There is a new, thick walled cavitary process within the anterior right lung base measuring up to 4.1 cm. Cannot exclude tumor recurrence. Consider further evaluation with PET-CT. 2. New small right pleural effusion. 3. Several new or enlarging pulmonary nodules are identified within the right lung. Suspicious for metastatic disease. 4. Small pericardial effusion, increased from previous exam. 5. Aortic Atherosclerosis (ICD10-I70.0). Electronically Signed: By: TKerby MoorsM.D. On: 07/15/2021 18:19      ASSESSMENT & PLAN:  No diagnosis found.  #Recurrent Squamous cell carcinoma of lung, stage IV 07/16/2020-09/28/2020 carboplatin Taxol and Keytruda x 1  partial response -Keytruda maintenance.-04/22/2021, progression-resumed on carboplatin/Taxol/Keytruda - did not tolerate due to myelosuppression.-->  Taxol held, carboplatin/Keytruda--> CT 07/2020 progression --> 08/03/2021 docetaxel and ramucirumab with gcsf support.   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  Monitor.  #Weight loss, continue follow-up with nutritionist.  Continue nutrition supplements.  Weight is stable. IV fluids in clinic today.   #Cancer associated pain- PDMP checked. Last refilled 01/01/21. Tolerating well. Regular bowel movements. Refilled norco today.   #Shortness of breath with exertion.   Combination of COPD/emphysema/lung cancer/symptomatic anemia. Continue albuterol as needed, Symbicort.  Symptoms are stable.  #Unprovoked left lower extremity DVT, status post thrombolysis/thrombectomy. Nonocclusive PE noted on recent CT. Patient has completed 6+ months of therapeutic dose of Eliquis.    Continue Eliquis 2.5 mg twice daily.  #Chronic hypomagnesia. Continue slow magnesium twice daily.    #Chronic hypokalemia, recent hypokalemia episode. Restart potassium chloride 20 meq daily.   #Port-A-Cath - s/p cath flo. Good blood return today.   Return of visit: Follow up with Dr. YTasia Catchingsas scheduled  LBeckey Rutter  DNP, AGNP-C [place] [facility] [place] (clinic) 08/15/2021

## 2021-08-24 ENCOUNTER — Encounter: Payer: Self-pay | Admitting: Oncology

## 2021-08-24 ENCOUNTER — Inpatient Hospital Stay: Payer: 59

## 2021-08-24 ENCOUNTER — Inpatient Hospital Stay (HOSPITAL_BASED_OUTPATIENT_CLINIC_OR_DEPARTMENT_OTHER): Payer: 59 | Admitting: Oncology

## 2021-08-24 ENCOUNTER — Other Ambulatory Visit: Payer: Self-pay

## 2021-08-24 VITALS — BP 108/75 | HR 80

## 2021-08-24 VITALS — BP 129/78 | HR 94 | Temp 96.8°F | Wt 132.0 lb

## 2021-08-24 DIAGNOSIS — Z5111 Encounter for antineoplastic chemotherapy: Secondary | ICD-10-CM

## 2021-08-24 DIAGNOSIS — Z5112 Encounter for antineoplastic immunotherapy: Secondary | ICD-10-CM | POA: Diagnosis not present

## 2021-08-24 DIAGNOSIS — C3491 Malignant neoplasm of unspecified part of right bronchus or lung: Secondary | ICD-10-CM

## 2021-08-24 DIAGNOSIS — D6481 Anemia due to antineoplastic chemotherapy: Secondary | ICD-10-CM | POA: Diagnosis not present

## 2021-08-24 DIAGNOSIS — E875 Hyperkalemia: Secondary | ICD-10-CM

## 2021-08-24 DIAGNOSIS — Z86718 Personal history of other venous thrombosis and embolism: Secondary | ICD-10-CM | POA: Diagnosis not present

## 2021-08-24 DIAGNOSIS — T451X5A Adverse effect of antineoplastic and immunosuppressive drugs, initial encounter: Secondary | ICD-10-CM

## 2021-08-24 DIAGNOSIS — J438 Other emphysema: Secondary | ICD-10-CM | POA: Diagnosis not present

## 2021-08-24 LAB — CBC WITH DIFFERENTIAL/PLATELET
Abs Immature Granulocytes: 0.14 10*3/uL — ABNORMAL HIGH (ref 0.00–0.07)
Basophils Absolute: 0.1 10*3/uL (ref 0.0–0.1)
Basophils Relative: 0 %
Eosinophils Absolute: 0 10*3/uL (ref 0.0–0.5)
Eosinophils Relative: 0 %
HCT: 32.3 % — ABNORMAL LOW (ref 39.0–52.0)
Hemoglobin: 11.3 g/dL — ABNORMAL LOW (ref 13.0–17.0)
Immature Granulocytes: 1 %
Lymphocytes Relative: 3 %
Lymphs Abs: 0.6 10*3/uL — ABNORMAL LOW (ref 0.7–4.0)
MCH: 41.2 pg — ABNORMAL HIGH (ref 26.0–34.0)
MCHC: 35 g/dL (ref 30.0–36.0)
MCV: 117.9 fL — ABNORMAL HIGH (ref 80.0–100.0)
Monocytes Absolute: 0.1 10*3/uL (ref 0.1–1.0)
Monocytes Relative: 0 %
Neutro Abs: 17.8 10*3/uL — ABNORMAL HIGH (ref 1.7–7.7)
Neutrophils Relative %: 96 %
Platelets: 259 10*3/uL (ref 150–400)
RBC: 2.74 MIL/uL — ABNORMAL LOW (ref 4.22–5.81)
RDW: 14.7 % (ref 11.5–15.5)
WBC: 18.7 10*3/uL — ABNORMAL HIGH (ref 4.0–10.5)
nRBC: 0 % (ref 0.0–0.2)

## 2021-08-24 LAB — COMPREHENSIVE METABOLIC PANEL
ALT: 8 U/L (ref 0–44)
AST: 19 U/L (ref 15–41)
Albumin: 3.3 g/dL — ABNORMAL LOW (ref 3.5–5.0)
Alkaline Phosphatase: 112 U/L (ref 38–126)
Anion gap: 11 (ref 5–15)
BUN: 11 mg/dL (ref 6–20)
CO2: 19 mmol/L — ABNORMAL LOW (ref 22–32)
Calcium: 8.8 mg/dL — ABNORMAL LOW (ref 8.9–10.3)
Chloride: 102 mmol/L (ref 98–111)
Creatinine, Ser: 1 mg/dL (ref 0.61–1.24)
GFR, Estimated: >60 mL/min (ref >60)
Glucose, Bld: 143 mg/dL — ABNORMAL HIGH (ref 70–99)
Potassium: 5.2 mmol/L — ABNORMAL HIGH (ref 3.5–5.1)
Sodium: 132 mmol/L — ABNORMAL LOW (ref 135–145)
Total Bilirubin: <0.1 mg/dL — ABNORMAL LOW (ref 0.3–1.2)
Total Protein: 7.6 g/dL (ref 6.5–8.1)

## 2021-08-24 LAB — PROTEIN, URINE, RANDOM: Total Protein, Urine: 24 mg/dL

## 2021-08-24 MED ORDER — HEPARIN SOD (PORK) LOCK FLUSH 100 UNIT/ML IV SOLN
500.0000 [IU] | Freq: Once | INTRAVENOUS | Status: DC
  Filled 2021-08-24: qty 5

## 2021-08-24 MED ORDER — DIPHENHYDRAMINE HCL 50 MG/ML IJ SOLN
50.0000 mg | Freq: Once | INTRAMUSCULAR | Status: AC
  Administered 2021-08-24: 50 mg via INTRAVENOUS
  Filled 2021-08-24: qty 1

## 2021-08-24 MED ORDER — HEPARIN SOD (PORK) LOCK FLUSH 100 UNIT/ML IV SOLN
500.0000 [IU] | Freq: Once | INTRAVENOUS | Status: AC | PRN
  Administered 2021-08-24: 500 [IU]
  Filled 2021-08-24: qty 5

## 2021-08-24 MED ORDER — SODIUM CHLORIDE 0.9 % IV SOLN
10.0000 mg | Freq: Once | INTRAVENOUS | Status: AC
  Administered 2021-08-24: 10 mg via INTRAVENOUS
  Filled 2021-08-24: qty 10

## 2021-08-24 MED ORDER — ACETAMINOPHEN 325 MG PO TABS
650.0000 mg | ORAL_TABLET | Freq: Once | ORAL | Status: AC
  Administered 2021-08-24: 650 mg via ORAL
  Filled 2021-08-24: qty 2

## 2021-08-24 MED ORDER — SODIUM CHLORIDE 0.9% FLUSH
10.0000 mL | INTRAVENOUS | Status: DC | PRN
  Administered 2021-08-24: 10 mL via INTRAVENOUS
  Filled 2021-08-24: qty 10

## 2021-08-24 MED ORDER — SODIUM CHLORIDE 0.9 % IV SOLN
10.0000 mg/kg | Freq: Once | INTRAVENOUS | Status: AC
  Administered 2021-08-24: 600 mg via INTRAVENOUS
  Filled 2021-08-24: qty 50

## 2021-08-24 MED ORDER — SODIUM CHLORIDE 0.9 % IV SOLN
Freq: Once | INTRAVENOUS | Status: AC
  Filled 2021-08-24: qty 250

## 2021-08-24 MED ORDER — SODIUM CHLORIDE 0.9 % IV SOLN
75.0000 mg/m2 | Freq: Once | INTRAVENOUS | Status: AC
  Administered 2021-08-24: 130 mg via INTRAVENOUS
  Filled 2021-08-24: qty 13

## 2021-08-24 NOTE — Patient Instructions (Signed)
[facility] [place]  Discharge Instructions: ?Thank you for choosing [facility] to provide your oncology and hematology care.  ?If you have a lab appointment with the Cancer Center, please go directly to the Cancer Center and check in at the registration area. ? ?Wear comfortable clothing and clothing appropriate for easy access to any Portacath or PICC line.  ? ?We strive to give you quality time with your provider. You may need to reschedule your appointment if you arrive late (15 or more minutes).  Arriving late affects you and other patients whose appointments are after yours.  Also, if you miss three or more appointments without notifying the office, you may be dismissed from the clinic at the provider?s discretion.    ?  ?For prescription refill requests, have your pharmacy contact our office and allow 72 hours for refills to be completed.   ? ?  ?To help prevent nausea and vomiting after your treatment, we encourage you to take your nausea medication as directed. ? ?BELOW ARE SYMPTOMS THAT SHOULD BE REPORTED IMMEDIATELY: ?*FEVER GREATER THAN 100.4 F (38 ?C) OR HIGHER ?*CHILLS OR SWEATING ?*NAUSEA AND VOMITING THAT IS NOT CONTROLLED WITH YOUR NAUSEA MEDICATION ?*UNUSUAL SHORTNESS OF BREATH ?*UNUSUAL BRUISING OR BLEEDING ?*URINARY PROBLEMS (pain or burning when urinating, or frequent urination) ?*BOWEL PROBLEMS (unusual diarrhea, constipation, pain near the anus) ?TENDERNESS IN MOUTH AND THROAT WITH OR WITHOUT PRESENCE OF ULCERS (sore throat, sores in mouth, or a toothache) ?UNUSUAL RASH, SWELLING OR PAIN  ?UNUSUAL VAGINAL DISCHARGE OR ITCHING  ? ?Items with * indicate a potential emergency and should be followed up as soon as possible or go to the Emergency Department if any problems should occur. ? ?Please show the CHEMOTHERAPY ALERT CARD or IMMUNOTHERAPY ALERT CARD at check-in to the Emergency Department and triage nurse. ? ?Should you have questions after your visit  or need to cancel or reschedule your appointment, please contact [facility]  [phone] and follow the prompts.  Office hours are 8:00 a.m. to 4:30 p.m. Monday - Friday. Please note that voicemails left after 4:00 p.m. may not be returned until the following business day.  We are closed weekends and major holidays. You have access to a nurse at all times for urgent questions. Please call the main number to the clinic [phone] and follow the prompts. ? ?For any non-urgent questions, you may also contact your provider using MyChart. We now offer e-Visits for anyone 18 and older to request care online for non-urgent symptoms. For details visit [email or web address]. ?  ?Also download the MyChart app! Go to the app store, search "MyChart", open the app, select , and log in with your MyChart username and password. ? ?Due to Covid, a mask is required upon entering the hospital/clinic. If you do not have a mask, one will be given to you upon arrival. For doctor visits, patients may have 1 support person aged 18 or older with them. For treatment visits, patients cannot have anyone with them due to current Covid guidelines and our immunocompromised population.  ?

## 2021-08-24 NOTE — Progress Notes (Signed)
Hypokalemia, with history of Hematology/Oncology Follow up note [phone] [phone]   Patient Care Team: Tracie Harrier, MD as PCP - General (Internal Medicine) Earlie Server, MD as Consulting Physician (Hematology and Oncology)  REASON FOR VISIT:  Follow up for treatment of squamous lung cancer.   HISTORY OF PRESENTING ILLNESS:  # Dec 2019 Stage I squamous lung cancer #s/p Bronchoscopy on 06/25/2018. subcarina EBUS FNA was non diagnostic, hypocellular specimen.  # 08/13/2018 CT guided right upper lobe biopsy pathology showed dense fibrosis and mixed inflammatory cells with prominent polytypic plasma cells component. Focal benign bronchial wall and alveolar spaces. No malignancy was identified.   # 08/13/2018 underwent right thoracotomy and wedge resection of a right upper lobe mass.  Frozen section was consistent with an inflammatory process.  On the second postop day, preliminary pathology reports high-grade malignancy.  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  08/20/2018 Patient was therefore taken back to the OR and underwent take complete lobectomy  pT1b pN0 cM0 stage I squamous cell lung cancer.  Margin is negative.  Recommend observation.    # 12/03/2018 CT chest w contrast showed local recurrence.  12/10/2018 PET showed No evidence of distant metastatic disease # 12/26/2018 s/p bronchoscopy biopsy. Confirmed local recurrence of squamous cell lung cancer.  medi port placed by Dr.Oaks.  # 06/14/2020 CT chest w contrast was reviewed and discussed with patient.  Evidence of progressive lymphangitic spread of tumor throughout the right lung, with contralateral lung nodules with  right pleural effusion  MRI brain is negative for CNS involvement.  CT abdomen with contrast showed no evidence of abdominal metastatic disease PET scan was not approved by insurance-peer to peer appeal with Dr.Vipul Bhanderi   # NGS - KRAS V14L,  TMB 26.6-high, MS stable, PD-L1  5% CDKN2C G48, DICER1c.2117-1G>T, FLT4 G1131S, NF1 A430f, NF1 L2643*, STK11 [id number] treatment Started concurrent chemoradiation on 01/16/2019 Carboplatin AUC of 2 and Taxol 45 mg/m2 weekly finished in 03/05/2019 04/10/2019, patient started on durvalumab maintenance. -CT showed progression.  Insurance denied PET scan evaluation. 07/16/2020-09/28/2020 4 cycles of carboplatin/paclitaxel/Keytruda  10/15/2020, CT chest abdomen pelvis redemonstrated postoperative and postradiation appearance of the right chest with perihilar consolidation and fibrosis.  Slight interval decrease in size of the pulmonary nodules associated with interlobular septal thickening nearly complete resolution of previously seen left-sided pulmonary nodules.  Minimal irregular residual.  Right pleural effusion improved.  Consistent with treatment response.  No evidence of metastatic disease with the abdomen or pelvis.  None obstructive bilateral nephrolithiasis - partial response to the treatment- 10/19/2020, Keytruda monotherapy maintenance  12/15/2020, CT chest without contrast showed resolution of lung nodules, no evidence of recurrent disease 12/30/2020, patient was admitted due to unprovoked extensive deep vein thrombosis from calf vein to the common femoral vein on the left.  Patient was started on heparin drip.  Patient underwent thrombolysis and is a colectomy on 12/31/2020.  Anticoagulation was switched to Eliquis at discharge.  04/22/2021, CT chest with contrast showed none occlusive pulmonary embolism, Interval progression of consolidation airspace opacity in the medial right middle lobe with bandlike opacity and bronchiectasis in the perihilar right lung,-this is compatible with evolving posttreatment changes although recurrent disease anteriorly is not excluded. Interval development of numerous bilateral pulmonary nodules, measuring up to 11 mm in the right lung base.  Concerning for metastatic disease.  Tiny right pleural  effusion.   04/26/2021, resumed on carboplatin/Taxol/Keytruda. Patient did not tolerate his chemotherapy of carboplatin/Taxol/Keytruda on 04/26/2021. Patient has developed severe anemia  status post PRBC transfusion. 05/25/2021, carboplatin AUC 5 with Keytruda 06/23/2021  continued on carboplatin AUC 5 with Keytruda 07/07/2021 carboplatin with [name]   07/15/2021 CT chest with contrast showed enlarged right lower lobe thick-walled cavitary lesion in the anterior aspect of the right lower lobe.  Concerning for progression.  Multiple other small pulmonary nodules are also noted elsewhere in the lungs, many of which are stable compared to prior studies.  Several are new or clearly enlarged.  The best example is an enlarging pulmonary nodule in the left upper lobe, 8 x 6 mm comparing to 4 x 3 mm on 04/22/2021.  #January 2023 patient was hospitalized due to hyperkalemia. Patient was given calcium gluconate, nebulized albuterol and IV Lasix.  He was observed overnight. potassium improved and patient was discharged.  He has stopped taking potassium supplementation.  INTERVAL HISTORY Samuel Choi is a 56 y.o. male who has above history reviewed by me today presents for reatment of  recurrent  squamous cell carcinoma. Patient tolerated cycle 1 docetaxel and ramucirumab with G-CSF support.  He was seen by nurse practitioner during interval for evaluation of tolerability.  Potassium was 3.3 and the patient was restarted on potassium. Today patient reports feeling well.  Weight has been stable.  Appetite is fair.  No new complaints.  No nausea vomiting diarrhea .   Review of Systems  Constitutional:  Positive for fatigue. Negative for appetite change, chills and fever.  HENT:   Negative for hearing loss and voice change.   Eyes:  Negative for eye problems and icterus.  Respiratory:  Positive for shortness of breath. Negative for chest tightness and cough.   Cardiovascular:  Negative for chest pain and  leg swelling.  Gastrointestinal:  Negative for abdominal distention, abdominal pain, nausea and vomiting.  Endocrine: Negative for hot flashes.  Genitourinary:  Negative for difficulty urinating, dysuria and frequency.   Musculoskeletal:  Negative for arthralgias.  Skin:  Negative for itching and rash.  Neurological:  Negative for light-headedness and numbness.  Hematological:  Negative for adenopathy. Does not bruise/bleed easily.  Psychiatric/Behavioral:  Negative for confusion.    MEDICAL HISTORY:  Past Medical History:  Diagnosis Date   Alcohol abuse    usually drinks 2-3 drinks per day   Atherosclerosis 06/2018   Chronic sinusitis    Dehydration 02/07/2019   Emphysema of lung ([place]) 06/2018   patient unaware of this.   Hip fracture ([place]) 05/2018   no surgery   History of kidney stones 05/2018   per xray, bilateral nephrolitiasis   Hypertension    Squamous cell carcinoma of lung, right ([place]) 06/2018    SURGICAL HISTORY: Past Surgical History:  Procedure Laterality Date   BRAIN SURGERY  10/2017   nasal/sinus endoscopy. mass benign   ELECTROMAGNETIC NAVIGATION BROCHOSCOPY Right 06/25/2018   Procedure: ELECTROMAGNETIC NAVIGATION BRONCHOSCOPY;  Surgeon: [name], MD;  Location: [facility];  Service: Cardiopulmonary;  Laterality: Right;   ENDOBRONCHIAL ULTRASOUND Right 06/25/2018   Procedure: ENDOBRONCHIAL ULTRASOUND;  Surgeon: [name], MD;  Location: [facility];  Service: Cardiopulmonary;  Laterality: Right;   ENDOBRONCHIAL ULTRASOUND Right 12/26/2018   Procedure: ENDOBRONCHIAL ULTRASOUND RIGHT;  Surgeon: [name], MD;  Location: [facility];  Service: Cardiopulmonary;  Laterality: Right;   FLEXIBLE BRONCHOSCOPY N/A 08/20/2018   Procedure: FLEXIBLE BRONCHOSCOPY PREOP;  Surgeon: [name], MD;  Location: [facility];  Service: Thoracic;  Laterality: N/A;   IR CV LINE INJECTION  04/04/2019   NASAL SINUS SURGERY  10/2017   At  [facility], frontal sinusotomy, ethmoidectomy, resection  anterior cranial fossa neoplasm, turbinate resection   PERIPHERAL VASCULAR THROMBECTOMY Left 12/31/2020   Procedure: PERIPHERAL VASCULAR THROMBECTOMY;  Surgeon: Algernon Huxley, MD;  Location: [facility];  Service: Cardiovascular;  Laterality: Left;   PORTACATH PLACEMENT Left 01/15/2019   Procedure: INSERTION PORT-A-CATH;  Surgeon: [name], MD;  Location: [facility];  Service: General;  Laterality: Left;   THORACOTOMY Right 08/13/2018   Procedure: PREOP BROCHOSCOPY WITH RIGHT THORACOTOMY AND RUL RESECTION;  Surgeon: [name], MD;  Location: [facility];  Service: General;  Laterality: Right;   THORACOTOMY Right 08/20/2018   Procedure: THORACOTOMY MAJOR RIGHT UPPER LOBE LOBECTOMY;  Surgeon: [name], MD;  Location: [facility];  Service: Thoracic;  Laterality: Right;   TOE SURGERY Left    pin in left toe    SOCIAL HISTORY: Social History   Socioeconomic History   Marital status: Married    Spouse name: lisa   Number of children: Not on file   Years of education: Not on file   Highest education level: Not on file  Occupational History   Occupation: welding    Comment: taking time off to resolve issues  Tobacco Use   Smoking status: Former    Packs/day: 0.50    Years: 15.00    Pack years: 7.50    Types: Cigarettes    Quit date: 08/2018    Years since quitting: 3.0   Smokeless tobacco: Never  Vaping Use   Vaping Use: Never used  Substance and Sexual Activity   Alcohol use: Yes    Alcohol/week: 3.0 standard drinks    Types: 3 Cans of beer per week    Comment: usually 2 drinks per week, per patient   Drug use: No   Sexual activity: Not on file  Other Topics Concern   Not on file  Social History Narrative   Not on file   Social Determinants of Health   Financial Resource Strain: Not on file  Food Insecurity: Not on file  Transportation Needs: Not on file  Physical Activity: Not on file  Stress: Not on file  Social Connections: Not on file  Intimate Partner  Violence: Not on file    FAMILY HISTORY: Family History  Problem Relation Age of Onset   Breast cancer Mother    Diabetes Mother    Lung cancer Father    Hypertension Father     ALLERGIES:  has No Known Allergies.  MEDICATIONS:  Current Outpatient Medications  Medication Sig Dispense Refill   albuterol (VENTOLIN HFA) 108 (90 Base) MCG/ACT inhaler INHALE 2 PUFFS BY MOUTH EVERY 6 HOURS AS NEEDED FOR WHEEZE OR SHORTNESS OF BREATH 8.5 g 5   apixaban (ELIQUIS) 2.5 MG TABS tablet Take 1 tablet (2.5 mg total) by mouth 2 (two) times daily. 60 tablet 3   budesonide-formoterol (SYMBICORT) 80-4.5 MCG/ACT inhaler Inhale 2 puffs into the lungs 2 (two) times daily. in the morning and at bedtime. 10.2 each 6   dexamethasone (DECADRON) 4 MG tablet Take 2 tablets (8 mg total) by mouth 2 (two) times daily. Start the day before Taxotere. Then daily after chemo for 2 days. 30 tablet 1   diltiazem (CARDIZEM CD) 120 MG 24 hr capsule Take 120 mg by mouth daily.     dronabinol (MARINOL) 5 MG capsule Take 1 capsule (5 mg total) by mouth 2 (two) times daily before a meal. 60 capsule 0   folic acid (V-R FOLIC ACID) 338 MCG tablet Take  1 tablet (400 mcg total) by mouth daily. 90 tablet 1   HYDROcodone-acetaminophen (NORCO/VICODIN) 5-325 MG tablet Take 1 tablet by mouth every 8 (eight) hours as needed for severe pain. 30 tablet 0   latanoprost (XALATAN) 0.005 % ophthalmic solution SMARTSIG:In Eye(s)     loratadine (CLARITIN) 10 MG tablet Take 10 mg by mouth daily.     methocarbamol (ROBAXIN) 500 MG tablet Take 1 tablet (500 mg total) by mouth at bedtime. 30 tablet 0   metoprolol succinate (TOPROL-XL) 25 MG 24 hr tablet Take 1 tablet (25 mg total) by mouth daily. 30 tablet 0   nystatin (MYCOSTATIN) 100000 UNIT/ML suspension Take 5 mLs (500,000 Units total) by mouth 4 (four) times daily. Swish and swallow. 473 mL 1   olmesartan (BENICAR) 40 MG tablet Take 40 mg by mouth every other day.      pantoprazole (PROTONIX)  20 MG tablet Take 1 tablet (20 mg total) by mouth daily. 60 tablet 2   SPIRIVA HANDIHALER 18 MCG inhalation capsule INHALE 1 CAPSULE VIA HANDIHALER ONCE DAILY AT THE SAME TIME EVERY DAY 30 capsule 0   vitamin B-12 (CYANOCOBALAMIN) 1000 MCG tablet Take 1 tablet (1,000 mcg total) by mouth daily. 90 tablet 1   hydrALAZINE (APRESOLINE) 100 MG tablet Take 50 mg by mouth 2 (two) times daily. (Patient not taking: Reported on 08/15/2021)     ondansetron (ZOFRAN) 8 MG tablet Take 1 tablet (8 mg total) by mouth every 8 (eight) hours as needed for refractory nausea / vomiting. Start on day 3 after chemo. (Patient not taking: Reported on 08/15/2021) 90 tablet 1   promethazine (PHENERGAN) 25 MG tablet Take 1 tablet (25 mg total) by mouth every 6 (six) hours as needed for nausea or vomiting. (Patient not taking: Reported on 08/15/2021) 30 tablet 0   No current facility-administered medications for this visit.   Facility-Administered Medications Ordered in Other Visits  Medication Dose Route Frequency Provider Last Rate Last Admin   dexamethasone (DECADRON) 10 MG/ML injection            DOCEtaxel (TAXOTERE) 130 mg in sodium chloride 0.9 % 250 mL chemo infusion  75 mg/m2 (Treatment Plan Recorded) Intravenous Once Earlie Server, MD       heparin lock flush 100 unit/mL  500 Units Intravenous Once Earlie Server, MD       heparin lock flush 100 unit/mL  500 Units Intravenous Once Earlie Server, MD       ramucirumab [facility]) 600 mg in sodium chloride 0.9 % 190 mL chemo infusion  10 mg/kg (Treatment Plan Recorded) Intravenous Once Earlie Server, MD       sodium chloride flush (NS) 0.9 % injection 10 mL  10 mL Intravenous PRN Earlie Server, MD   10 mL at 04/04/19 0903   sodium chloride flush (NS) 0.9 % injection 10 mL  10 mL Intravenous PRN Earlie Server, MD   10 mL at 07/26/20 1308   sodium chloride flush (NS) 0.9 % injection 10 mL  10 mL Intravenous PRN Earlie Server, MD   10 mL at 07/28/21 0828     PHYSICAL EXAMINATION: ECOG PERFORMANCE STATUS: 1 -  Symptomatic but completely ambulatory Vitals:   08/24/21 0838  BP: 129/78  Pulse: 94  Temp: (!) 96.8 F (36 C)   Filed Weights   08/24/21 0838  Weight: 132 lb (59.9 kg)    Physical Exam Constitutional:      General: He is not in acute distress. HENT:  Head: Normocephalic and atraumatic.     Mouth/Throat:     Comments:   Eyes:     General: No scleral icterus. Cardiovascular:     Rate and Rhythm: Normal rate and regular rhythm.     Heart sounds: Normal heart sounds.  Pulmonary:     Effort: Pulmonary effort is normal. No respiratory distress.     Breath sounds: No wheezing.     Comments: Decreased breath sound bilaterally Abdominal:     General: Bowel sounds are normal. There is no distension.     Palpations: Abdomen is soft.  Musculoskeletal:        General: No deformity. Normal range of motion.     Cervical back: Normal range of motion and neck supple.  Skin:    General: Skin is warm and dry.     Findings: No erythema or rash.  Neurological:     Mental Status: He is alert and oriented to person, place, and time. Mental status is at baseline.     Cranial Nerves: No cranial nerve deficit.     Coordination: Coordination normal.  Psychiatric:        Mood and Affect: Mood normal.     LABORATORY DATA:  I have reviewed the data as listed Lab Results  Component Value Date   WBC 18.7 (H) 08/24/2021   HGB 11.3 (L) 08/24/2021   HCT 32.3 (L) 08/24/2021   MCV 117.9 (H) 08/24/2021   PLT 259 08/24/2021   Recent Labs    08/31/20 0812 09/07/20 1117 08/03/21 0815 08/15/21 0820 08/24/21 0823  NA 137   < > 141 132* 132*  K 3.1*   < > 3.8 3.3* 5.2*  CL 100   < > 102 97* 102  CO2 25   < > 29 24 19*  GLUCOSE 107*   < > 113* 134* 143*  BUN 7   < > _0 CREATININE 0.79   < > 0.88 0.79 1.00  CALCIUM 8.9   < > 9.2 8.7* 8.8*  GFRNONAA >60   < > >60 >60 >60  PROT 7.4   < > 7.9 7.2 7.6  ALBUMIN 3.4*   < > 3.7 3.4* 3.3*  AST 31   < > _1 ALT 13   < > _2 ALKPHOS 101   < > 81 122 112  BILITOT 2.0*   < > 0.5 0.3 <0.1*  BILIDIR 0.4*  --   --   --   --    < > = values in this interval not displayed.    Iron/TIBC/Ferritin/ %Sat No results found for: IRON, TIBC, FERRITIN, IRONPCTSAT   RADIOGRAPHIC STUDIES: I have personally reviewed the radiological images as listed and agreed with the findings in the report. CT Chest W Contrast  Addendum Date: 07/19/2021   ADDENDUM REPORT: 07/19/2021 10:45 ADDENDUM: As described in the original report there is a thick-walled cavitary lesion in the anterior aspect of the right lower lobe which is new compared to the prior examination from 12/15/2020, and clearly enlarged when compared to the more recent prior study 04/22/2021, highly concerning for a metastatic lesion, particularly in light of the cell type of the patient's primary lesion (squamous cell carcinoma). Multiple other smaller pulmonary nodules are also noted elsewhere in the lungs, many of which are stable compared to prior studies, but several are new or clearly enlarged. The best example of an enlarging pulmonary nodule is in the left upper  lobe (axial image 43 of series 3) measuring 8 x 6 mm on today's examination (previously only 4 x 3 mm on 04/22/2021). Electronically Signed   By: Vinnie Langton M.D.   On: 07/19/2021 10:45   Result Date: 07/19/2021 CLINICAL DATA:  Restaging squamous cell carcinoma of the lung. EXAM: CT CHEST WITH CONTRAST TECHNIQUE: Multidetector CT imaging of the chest was performed during intravenous contrast administration. CONTRAST:  34m OMNIPAQUE IOHEXOL 300 MG/ML  SOLN COMPARISON:  12/15/2020 FINDINGS: Cardiovascular: Heart size is within normal limits. There is a small pericardial effusion, increased from previous exam. Aortic atherosclerosis and coronary artery calcifications. Mediastinum/Nodes: Normal appearance of the thyroid gland. The trachea appears patent and is midline. Normal appearance of the esophagus. No enlarged  axillary, supraclavicular, mediastinal, or hilar lymph nodes. Lungs/Pleura: New small right pleural effusion. Bandlike area of fibrosis and masslike architectural distortion extends across the right midlung and is compatible with changes secondary to external beam radiation. There is associated volume loss within the right lung secondary to right upper lobectomy. Within the anterior right lung base there is a new, thick walled cavitary process which measures 4.1 by 2.3 by 3.4 cm, image 91/2 and image 57/4. Small nodule within the lateral right lung base measures 5 mm, image 117/3. New from previous exam. Subpleural nodule within the posterior right lower lobe measures 4 mm and appears decreased from 5 mm previously, image 55/3. 3 mm left upper lobe lung nodule, image 58/3. Previously 2 mm. New nodule in the periphery of the left midlung measures 4 mm, image 102/3 Upper Abdomen: No acute abnormality. Musculoskeletal: No chest wall abnormality. No acute or significant osseous findings. IMPRESSION: 1. There is a new, thick walled cavitary process within the anterior right lung base measuring up to 4.1 cm. Cannot exclude tumor recurrence. Consider further evaluation with PET-CT. 2. New small right pleural effusion. 3. Several new or enlarging pulmonary nodules are identified within the right lung. Suspicious for metastatic disease. 4. Small pericardial effusion, increased from previous exam. 5. Aortic Atherosclerosis (ICD10-I70.0). Electronically Signed: By: TKerby MoorsM.D. On: 07/15/2021 18:19      ASSESSMENT & PLAN:  1. [place] of right [place]   2. Encounter for antineoplastic chemotherapy   3. Antineoplastic chemotherapy induced anemia   4. History of thrombosis   5. [place]   6. Hyperkalemia    #Recurrent Squamous cell carcinoma of lung, stage IV 07/16/2020-09/28/2020 carboplatin Taxol and Keytruda x 1  partial response -Keytruda maintenance.-04/22/2021,  progression-resumed on carboplatin/Taxol/Keytruda - did not tolerate due to myelosuppression.-->  Taxol held, carboplatin/Keytruda--> CT 07/2020 progression --> 08/03/2021 docetaxel and ramucirumab. Labs reviewed and discussed with patient. Proceed with cycle 2 docetaxel and ramucirumab with day 3 G-CSF support.  Continue Claritin daily for 4 days.  #hyperkalemia, likely due to the restart of potassium tablets.  Advised patient to stop potassium supplementation.  #Chemotherapy-induced anemia, hemoglobin stable.  Monitor.   #Weight loss, continue follow-up with nutritionist.  Continue nutrition supplements.    #Shortness of breath with exertion.  Combination of COPD/emphysema/lung cancer/symptomatic anemia.. Continue albuterol as needed, Symbicort.  Symptoms are stable.  #Unprovoked left lower extremity DVT, status post thrombolysis/thrombectomy, Nonocclusive PE noted on recent CT. Patient has completed 6+ months of therapeutic dose of Eliquis.  Continue Eliquis 2.5 mg twice daily.    #Chronic hypomagnesia.  Continue slow magnesium twice daily.    return of visit:  Lab MD 3 weeks docetaxel/ramucirumab/G-CSF.   ZEarlie Server MD, PhD  08/24/2021

## 2021-08-24 NOTE — Progress Notes (Signed)
Per Dr. Tasia Catchings, ok to proceed with treatment. Will  instruct patient to stop taking potassium if he is taking.

## 2021-08-26 ENCOUNTER — Inpatient Hospital Stay: Payer: 59

## 2021-08-26 ENCOUNTER — Other Ambulatory Visit: Payer: Self-pay

## 2021-08-26 DIAGNOSIS — C3491 Malignant neoplasm of unspecified part of right bronchus or lung: Secondary | ICD-10-CM

## 2021-08-26 DIAGNOSIS — Z5112 Encounter for antineoplastic immunotherapy: Secondary | ICD-10-CM | POA: Diagnosis not present

## 2021-08-26 MED ORDER — PEGFILGRASTIM-BMEZ 6 MG/0.6ML ~~LOC~~ SOSY
6.0000 mg | PREFILLED_SYRINGE | Freq: Once | SUBCUTANEOUS | Status: AC
  Administered 2021-08-26: 6 mg via SUBCUTANEOUS
  Filled 2021-08-26: qty 0.6

## 2021-09-14 ENCOUNTER — Other Ambulatory Visit: Payer: Self-pay

## 2021-09-14 ENCOUNTER — Inpatient Hospital Stay: Payer: 59 | Attending: Oncology

## 2021-09-14 ENCOUNTER — Inpatient Hospital Stay (HOSPITAL_BASED_OUTPATIENT_CLINIC_OR_DEPARTMENT_OTHER): Payer: 59 | Admitting: Oncology

## 2021-09-14 ENCOUNTER — Encounter: Payer: Self-pay | Admitting: Oncology

## 2021-09-14 ENCOUNTER — Inpatient Hospital Stay: Payer: 59

## 2021-09-14 VITALS — BP 116/76 | HR 88 | Temp 97.5°F | Resp 18 | Wt 130.1 lb

## 2021-09-14 DIAGNOSIS — T451X5A Adverse effect of antineoplastic and immunosuppressive drugs, initial encounter: Secondary | ICD-10-CM | POA: Insufficient documentation

## 2021-09-14 DIAGNOSIS — Z87891 Personal history of nicotine dependence: Secondary | ICD-10-CM | POA: Diagnosis not present

## 2021-09-14 DIAGNOSIS — Z5111 Encounter for antineoplastic chemotherapy: Secondary | ICD-10-CM

## 2021-09-14 DIAGNOSIS — C3411 Malignant neoplasm of upper lobe, right bronchus or lung: Secondary | ICD-10-CM | POA: Diagnosis present

## 2021-09-14 DIAGNOSIS — Z79899 Other long term (current) drug therapy: Secondary | ICD-10-CM | POA: Insufficient documentation

## 2021-09-14 DIAGNOSIS — Z86718 Personal history of other venous thrombosis and embolism: Secondary | ICD-10-CM

## 2021-09-14 DIAGNOSIS — D6481 Anemia due to antineoplastic chemotherapy: Secondary | ICD-10-CM

## 2021-09-14 DIAGNOSIS — Z5112 Encounter for antineoplastic immunotherapy: Secondary | ICD-10-CM | POA: Insufficient documentation

## 2021-09-14 DIAGNOSIS — R634 Abnormal weight loss: Secondary | ICD-10-CM | POA: Insufficient documentation

## 2021-09-14 DIAGNOSIS — Z7951 Long term (current) use of inhaled steroids: Secondary | ICD-10-CM | POA: Diagnosis not present

## 2021-09-14 DIAGNOSIS — R197 Diarrhea, unspecified: Secondary | ICD-10-CM | POA: Diagnosis not present

## 2021-09-14 DIAGNOSIS — C3491 Malignant neoplasm of unspecified part of right bronchus or lung: Secondary | ICD-10-CM

## 2021-09-14 DIAGNOSIS — J438 Other emphysema: Secondary | ICD-10-CM

## 2021-09-14 DIAGNOSIS — Z7952 Long term (current) use of systemic steroids: Secondary | ICD-10-CM | POA: Insufficient documentation

## 2021-09-14 LAB — CBC WITH DIFFERENTIAL/PLATELET
Abs Immature Granulocytes: 0.26 10*3/uL — ABNORMAL HIGH (ref 0.00–0.07)
Basophils Absolute: 0.1 10*3/uL (ref 0.0–0.1)
Basophils Relative: 0 %
Eosinophils Absolute: 0 10*3/uL (ref 0.0–0.5)
Eosinophils Relative: 0 %
HCT: 31 % — ABNORMAL LOW (ref 39.0–52.0)
Hemoglobin: 10.6 g/dL — ABNORMAL LOW (ref 13.0–17.0)
Immature Granulocytes: 1 %
Lymphocytes Relative: 4 %
Lymphs Abs: 0.9 10*3/uL (ref 0.7–4.0)
MCH: 39.3 pg — ABNORMAL HIGH (ref 26.0–34.0)
MCHC: 34.2 g/dL (ref 30.0–36.0)
MCV: 114.8 fL — ABNORMAL HIGH (ref 80.0–100.0)
Monocytes Absolute: 0.1 10*3/uL (ref 0.1–1.0)
Monocytes Relative: 0 %
Neutro Abs: 23.4 10*3/uL — ABNORMAL HIGH (ref 1.7–7.7)
Neutrophils Relative %: 95 %
Platelets: 210 10*3/uL (ref 150–400)
RBC: 2.7 MIL/uL — ABNORMAL LOW (ref 4.22–5.81)
RDW: 14.7 % (ref 11.5–15.5)
WBC: 24.7 10*3/uL — ABNORMAL HIGH (ref 4.0–10.5)
nRBC: 0.2 % (ref 0.0–0.2)

## 2021-09-14 LAB — COMPREHENSIVE METABOLIC PANEL
ALT: 7 U/L (ref 0–44)
AST: 17 U/L (ref 15–41)
Albumin: 3 g/dL — ABNORMAL LOW (ref 3.5–5.0)
Alkaline Phosphatase: 100 U/L (ref 38–126)
Anion gap: 13 (ref 5–15)
BUN: 8 mg/dL (ref 6–20)
CO2: 21 mmol/L — ABNORMAL LOW (ref 22–32)
Calcium: 8.5 mg/dL — ABNORMAL LOW (ref 8.9–10.3)
Chloride: 102 mmol/L (ref 98–111)
Creatinine, Ser: 0.89 mg/dL (ref 0.61–1.24)
GFR, Estimated: >60 mL/min (ref >60)
Glucose, Bld: 175 mg/dL — ABNORMAL HIGH (ref 70–99)
Potassium: 4 mmol/L (ref 3.5–5.1)
Sodium: 136 mmol/L (ref 135–145)
Total Bilirubin: 0.4 mg/dL (ref 0.3–1.2)
Total Protein: 7.1 g/dL (ref 6.5–8.1)

## 2021-09-14 LAB — PROTEIN, URINE, RANDOM: Total Protein, Urine: 27 mg/dL

## 2021-09-14 MED ORDER — SODIUM CHLORIDE 0.9 % IV SOLN
10.0000 mg | Freq: Once | INTRAVENOUS | Status: AC
  Administered 2021-09-14: 10 mg via INTRAVENOUS
  Filled 2021-09-14: qty 10

## 2021-09-14 MED ORDER — SODIUM CHLORIDE 0.9 % IV SOLN
75.0000 mg/m2 | Freq: Once | INTRAVENOUS | Status: AC
  Administered 2021-09-14: 130 mg via INTRAVENOUS
  Filled 2021-09-14: qty 13

## 2021-09-14 MED ORDER — HEPARIN SOD (PORK) LOCK FLUSH 100 UNIT/ML IV SOLN
500.0000 [IU] | Freq: Once | INTRAVENOUS | Status: AC | PRN
  Filled 2021-09-14: qty 5

## 2021-09-14 MED ORDER — HEPARIN SOD (PORK) LOCK FLUSH 100 UNIT/ML IV SOLN
INTRAVENOUS | Status: AC
  Administered 2021-09-14: 500 [IU]
  Filled 2021-09-14: qty 5

## 2021-09-14 MED ORDER — SODIUM CHLORIDE 0.9 % IV SOLN
Freq: Once | INTRAVENOUS | Status: AC
  Filled 2021-09-14: qty 250

## 2021-09-14 MED ORDER — ACETAMINOPHEN 325 MG PO TABS
650.0000 mg | ORAL_TABLET | Freq: Once | ORAL | Status: AC
  Administered 2021-09-14: 650 mg via ORAL
  Filled 2021-09-14: qty 2

## 2021-09-14 MED ORDER — DIPHENHYDRAMINE HCL 50 MG/ML IJ SOLN
50.0000 mg | Freq: Once | INTRAMUSCULAR | Status: AC
  Administered 2021-09-14: 50 mg via INTRAVENOUS
  Filled 2021-09-14: qty 1

## 2021-09-14 MED ORDER — SODIUM CHLORIDE 0.9 % IV SOLN
10.0000 mg/kg | Freq: Once | INTRAVENOUS | Status: AC
  Administered 2021-09-14: 600 mg via INTRAVENOUS
  Filled 2021-09-14: qty 50

## 2021-09-14 NOTE — Progress Notes (Signed)
Pt here for follow up. Pt reports pain and swelling to left arm, where he had an IV in the hospital. Pt also reports that his skin has been peeling.  ?

## 2021-09-14 NOTE — Patient Instructions (Signed)
[facility] CTR AT [place]  Discharge Instructions: ?Thank you for choosing [facility] [place] to provide your oncology and hematology care.  ?If you have a lab appointment with the [facility], please go directly to the [facility] and check in at the registration area. ? ?Wear comfortable clothing and clothing appropriate for easy access to any Portacath or PICC line.  ? ?We strive to give you quality time with your provider. You may need to reschedule your appointment if you arrive late (15 or more minutes).  Arriving late affects you and other patients whose appointments are after yours.  Also, if you miss three or more appointments without notifying the office, you may be dismissed from the clinic at the provider?s discretion.    ?  ?For prescription refill requests, have your pharmacy contact our office and allow 72 hours for refills to be completed.   ? ?Today you received the following chemotherapy and/or immunotherapy agents Docetaxel and Ramucirumab     ?  ?To help prevent nausea and vomiting after your treatment, we encourage you to take your nausea medication as directed. ? ?BELOW ARE SYMPTOMS THAT SHOULD BE REPORTED IMMEDIATELY: ?*FEVER GREATER THAN 100.4 F (38 ?C) OR HIGHER ?*CHILLS OR SWEATING ?*NAUSEA AND VOMITING THAT IS NOT CONTROLLED WITH YOUR NAUSEA MEDICATION ?*UNUSUAL SHORTNESS OF BREATH ?*UNUSUAL BRUISING OR BLEEDING ?*URINARY PROBLEMS (pain or burning when urinating, or frequent urination) ?*BOWEL PROBLEMS (unusual diarrhea, constipation, pain near the anus) ?TENDERNESS IN MOUTH AND THROAT WITH OR WITHOUT PRESENCE OF ULCERS (sore throat, sores in mouth, or a toothache) ?UNUSUAL RASH, SWELLING OR PAIN  ?UNUSUAL VAGINAL DISCHARGE OR ITCHING  ? ?Items with * indicate a potential emergency and should be followed up as soon as possible or go to the Emergency Department if any problems should occur. ? ?Please show the CHEMOTHERAPY ALERT CARD or IMMUNOTHERAPY ALERT CARD  at check-in to the Emergency Department and triage nurse. ? ?Should you have questions after your visit or need to cancel or reschedule your appointment, please contact [facility] [place] AT [place]  [phone] and follow the prompts.  Office hours are 8:00 a.m. to 4:30 p.m. Monday - Friday. Please note that voicemails left after 4:00 p.m. may not be returned until the following business day.  We are closed weekends and major holidays. You have access to a nurse at all times for urgent questions. Please call the main number to the clinic [phone] and follow the prompts. ? ?For any non-urgent questions, you may also contact your provider using MyChart. We now offer e-Visits for anyone 58 and older to request care online for non-urgent symptoms. For details visit [email or web address]. ?  ?Also download the MyChart app! Go to the app store, search "MyChart", open the app, select [facility], and log in with your MyChart username and password. ? ?Due to Covid, a mask is required upon entering the hospital/clinic. If you do not have a mask, one will be given to you upon arrival. For doctor visits, patients may have 1 support person aged 68 or older with them. For treatment visits, patients cannot have anyone with them due to current Covid guidelines and our immunocompromised population.  ?

## 2021-09-14 NOTE — Progress Notes (Signed)
Hematology/Oncology Progress note [phone] [phone]      Patient Care Team: Tracie Harrier, MD as PCP - General (Internal Medicine) Earlie Server, MD as Consulting Physician (Hematology and Oncology)  REASON FOR VISIT:  Follow up for treatment of squamous lung cancer.   HISTORY OF PRESENTING ILLNESS:  # Dec 2019 Stage I squamous lung cancer #s/p Bronchoscopy on 06/25/2018. subcarina EBUS FNA was non diagnostic, hypocellular specimen.  # 08/13/2018 CT guided right upper lobe biopsy pathology showed dense fibrosis and mixed inflammatory cells with prominent polytypic plasma cells component. Focal benign bronchial wall and alveolar spaces. No malignancy was identified.   # 08/13/2018 underwent right thoracotomy and wedge resection of a right upper lobe mass.  Frozen section was consistent with an inflammatory process.  On the second postop day, preliminary pathology reports high-grade malignancy.  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  08/20/2018 Patient was therefore taken back to the OR and underwent take complete lobectomy  pT1b pN0 cM0 stage I squamous cell lung cancer.  Margin is negative.  Recommend observation.    # 12/03/2018 CT chest w contrast showed local recurrence.  12/10/2018 PET showed No evidence of distant metastatic disease # 12/26/2018 s/p bronchoscopy biopsy. Confirmed local recurrence of squamous cell lung cancer.  medi port placed by Dr.Oaks.  # 06/14/2020 CT chest w contrast was reviewed and discussed with patient.  Evidence of progressive lymphangitic spread of tumor throughout the right lung, with contralateral lung nodules with  right pleural effusion  MRI brain is negative for CNS involvement.  CT abdomen with contrast showed no evidence of abdominal metastatic disease PET scan was not approved by insurance-peer to peer appeal with Dr.Vipul Bhanderi   # NGS - KRAS V14L,  TMB 26.6-high, MS stable, PD-L1 5% CDKN2C G48,  DICER1c.2117-1G>T, FLT4 G1131S, NF1 A440f, NF1 L2643*, STK11 N1824fCancer treatment Started concurrent chemoradiation on 01/16/2019 Carboplatin AUC of 2 and Taxol 45 mg/m2 weekly finished in 03/05/2019 04/10/2019, patient started on durvalumab maintenance. -CT showed progression.  Insurance denied PET scan evaluation. 07/16/2020-09/28/2020 4 cycles of carboplatin/paclitaxel/Keytruda  10/15/2020, CT chest abdomen pelvis redemonstrated postoperative and postradiation appearance of the right chest with perihilar consolidation and fibrosis.  Slight interval decrease in size of the pulmonary nodules associated with interlobular septal thickening nearly complete resolution of previously seen left-sided pulmonary nodules.  Minimal irregular residual.  Right pleural effusion improved.  Consistent with treatment response.  No evidence of metastatic disease with the abdomen or pelvis.  None obstructive bilateral nephrolithiasis - partial response to the treatment- 10/19/2020, Keytruda monotherapy maintenance  12/15/2020, CT chest without contrast showed resolution of lung nodules, no evidence of recurrent disease 12/30/2020, patient was admitted due to unprovoked extensive deep vein thrombosis from calf vein to the common femoral vein on the left.  Patient was started on heparin drip.  Patient underwent thrombolysis and is a colectomy on 12/31/2020.  Anticoagulation was switched to Eliquis at discharge.  04/22/2021, CT chest with contrast showed none occlusive pulmonary embolism, Interval progression of consolidation airspace opacity in the medial right middle lobe with bandlike opacity and bronchiectasis in the perihilar right lung,-this is compatible with evolving posttreatment changes although recurrent disease anteriorly is not excluded. Interval development of numerous bilateral pulmonary nodules, measuring up to 11 mm in the right lung base.  Concerning for metastatic disease.  Tiny right pleural effusion.    04/26/2021, resumed on carboplatin/Taxol/Keytruda. Patient did not tolerate his chemotherapy of carboplatin/Taxol/Keytruda on 04/26/2021. Patient has developed severe anemia status post  PRBC transfusion. 05/25/2021, carboplatin AUC 5 with Keytruda 06/23/2021  continued on carboplatin AUC 5 with Keytruda 07/07/2021 carboplatin with [name]   07/15/2021 CT chest with contrast showed enlarged right lower lobe thick-walled cavitary lesion in the anterior aspect of the right lower lobe.  Concerning for progression.  Multiple other small pulmonary nodules are also noted elsewhere in the lungs, many of which are stable compared to prior studies.  Several are new or clearly enlarged.  The best example is an enlarging pulmonary nodule in the left upper lobe, 8 x 6 mm comparing to 4 x 3 mm on 04/22/2021.  #January 2023 patient was hospitalized due to hyperkalemia. Patient was given calcium gluconate, nebulized albuterol and IV Lasix.  He was observed overnight. potassium improved and patient was discharged.  He has stopped taking potassium supplementation.  INTERVAL HISTORY Samuel Choi is a 56 y.o. male who has above history reviewed by me today presents for reatment of  recurrent  squamous cell carcinoma.  Today patient reports feeling well.  Lost 2 pounds since last visit.   Appetite is fair. He follows up with nutritionist  No new complaints.  No nausea vomiting diarrhea He plans to go on a cruise trip and will not be able to get his GCSF injection on day 3.   Left forearm mild tenderness since his admission in January for hyperkalemia.     Review of Systems  Constitutional:  Positive for fatigue. Negative for appetite change, chills and fever.  HENT:   Negative for hearing loss and voice change.   Eyes:  Negative for eye problems and icterus.  Respiratory:  Positive for shortness of breath. Negative for chest tightness and cough.   Cardiovascular:  Negative for chest pain and leg swelling.   Gastrointestinal:  Negative for abdominal distention, abdominal pain, nausea and vomiting.  Endocrine: Negative for hot flashes.  Genitourinary:  Negative for difficulty urinating, dysuria and frequency.   Musculoskeletal:  Negative for arthralgias.  Skin:  Negative for itching and rash.  Neurological:  Negative for light-headedness and numbness.  Hematological:  Negative for adenopathy. Does not bruise/bleed easily.  Psychiatric/Behavioral:  Negative for confusion.    MEDICAL HISTORY:  Past Medical History:  Diagnosis Date   Alcohol abuse    usually drinks 2-3 drinks per day   Atherosclerosis 06/2018   Chronic sinusitis    Dehydration 02/07/2019   Emphysema of lung ([place]) 06/2018   patient unaware of this.   Hip fracture ([place]) 05/2018   no surgery   History of kidney stones 05/2018   per xray, bilateral nephrolitiasis   Hypertension    Squamous cell carcinoma of lung, right ([place]) 06/2018    SURGICAL HISTORY: Past Surgical History:  Procedure Laterality Date   BRAIN SURGERY  10/2017   nasal/sinus endoscopy. mass benign   ELECTROMAGNETIC NAVIGATION BROCHOSCOPY Right 06/25/2018   Procedure: ELECTROMAGNETIC NAVIGATION BRONCHOSCOPY;  Surgeon: [name], MD;  Location: [facility] ORS;  Service: Cardiopulmonary;  Laterality: Right;   ENDOBRONCHIAL ULTRASOUND Right 06/25/2018   Procedure: ENDOBRONCHIAL ULTRASOUND;  Surgeon: [name], MD;  Location: [facility] ORS;  Service: Cardiopulmonary;  Laterality: Right;   ENDOBRONCHIAL ULTRASOUND Right 12/26/2018   Procedure: ENDOBRONCHIAL ULTRASOUND RIGHT;  Surgeon: [name], MD;  Location: [facility] ORS;  Service: Cardiopulmonary;  Laterality: Right;   FLEXIBLE BRONCHOSCOPY N/A 08/20/2018   Procedure: FLEXIBLE BRONCHOSCOPY PREOP;  Surgeon: [name], MD;  Location: [facility] ORS;  Service: Thoracic;  Laterality: N/A;   IR CV LINE INJECTION  04/04/2019  NASAL SINUS SURGERY  10/2017   At [facility], frontal sinusotomy, ethmoidectomy, resection anterior  cranial fossa neoplasm, turbinate resection   PERIPHERAL VASCULAR THROMBECTOMY Left 12/31/2020   Procedure: PERIPHERAL VASCULAR THROMBECTOMY;  Surgeon: Algernon Huxley, MD;  Location: [facility];  Service: Cardiovascular;  Laterality: Left;   PORTACATH PLACEMENT Left 01/15/2019   Procedure: INSERTION PORT-A-CATH;  Surgeon: [name], MD;  Location: [facility] ORS;  Service: General;  Laterality: Left;   THORACOTOMY Right 08/13/2018   Procedure: PREOP BROCHOSCOPY WITH RIGHT THORACOTOMY AND RUL RESECTION;  Surgeon: [name], MD;  Location: [facility] OR[facility];  Service: General;  Laterality: Right;   THORACOTOMY Right 08/20/2018   Procedure: THORACOTOMY MAJOR RIGHT UPPER LOBE LOBECTOMY;  Surgeon: [name], MD;  Location: [facility] OR[facility];  Service: Thoracic;  Laterality: Right;   TOE SURGERY Left    pin in left toe    SOCIAL HISTORY: Social History   Socioeconomic History   Marital status: Married    Spouse name: lisa   Number of children: Not on file   Years of education: Not on file   Highest education level: Not on file  Occupational History   Occupation: welding    Comment: taking time off to resolve issues  Tobacco Use   Smoking status: Former    Packs/day: 0.50    Years: 15.00    Pack years: 7.50    Types: Cigarettes    Quit date: 08/2018    Years since quitting: 3.0   Smokeless tobacco: Never  Vaping Use   Vaping Use: Never used  Substance and Sexual Activity   Alcohol use: Yes    Alcohol/week: 3.0 standard drinks    Types: 3 Cans of beer per week    Comment: usually 2 drinks per week, per patient   Drug use: No   Sexual activity: Not on file  Other Topics Concern   Not on file  Social History Narrative   Not on file   Social Determinants of Health   Financial Resource Strain: Not on file  Food Insecurity: Not on file  Transportation Needs: Not on file  Physical Activity: Not on file  Stress: Not on file  Social Connections: Not on file  Intimate Partner Violence:  Not on file    FAMILY HISTORY: Family History  Problem Relation Age of Onset   Breast cancer Mother    Diabetes Mother    Lung cancer Father    Hypertension Father     ALLERGIES:  has No Known Allergies.  MEDICATIONS:  Current Outpatient Medications  Medication Sig Dispense Refill   albuterol (VENTOLIN HFA) 108 (90 Base) MCG/ACT inhaler INHALE 2 PUFFS BY MOUTH EVERY 6 HOURS AS NEEDED FOR WHEEZE OR SHORTNESS OF BREATH 8.5 g 5   apixaban (ELIQUIS) 2.5 MG TABS tablet Take 1 tablet (2.5 mg total) by mouth 2 (two) times daily. 60 tablet 3   budesonide-formoterol (SYMBICORT) 80-4.5 MCG/ACT inhaler Inhale 2 puffs into the lungs 2 (two) times daily. in the morning and at bedtime. 10.2 each 6   dexamethasone (DECADRON) 4 MG tablet Take 2 tablets (8 mg total) by mouth 2 (two) times daily. Start the day before Taxotere. Then daily after chemo for 2 days. 30 tablet 1   diltiazem (CARDIZEM CD) 120 MG 24 hr capsule Take 120 mg by mouth daily.     dronabinol (MARINOL) 5 MG capsule Take 1 capsule (5 mg total) by mouth 2 (two) times daily before a meal. 60 capsule 0   folic  acid (V-R FOLIC ACID) 161 MCG tablet Take 1 tablet (400 mcg total) by mouth daily. 90 tablet 1   HYDROcodone-acetaminophen (NORCO/VICODIN) 5-325 MG tablet Take 1 tablet by mouth every 8 (eight) hours as needed for severe pain. 30 tablet 0   latanoprost (XALATAN) 0.005 % ophthalmic solution SMARTSIG:In Eye(s)     loratadine (CLARITIN) 10 MG tablet Take 10 mg by mouth daily.     methocarbamol (ROBAXIN) 500 MG tablet Take 1 tablet (500 mg total) by mouth at bedtime. 30 tablet 0   metoprolol succinate (TOPROL-XL) 25 MG 24 hr tablet Take 1 tablet (25 mg total) by mouth daily. 30 tablet 0   nystatin (MYCOSTATIN) 100000 UNIT/ML suspension Take 5 mLs (500,000 Units total) by mouth 4 (four) times daily. Swish and swallow. 473 mL 1   olmesartan (BENICAR) 40 MG tablet Take 40 mg by mouth every other day.      ondansetron (ZOFRAN) 8 MG tablet  Take 1 tablet (8 mg total) by mouth every 8 (eight) hours as needed for refractory nausea / vomiting. Start on day 3 after chemo. 90 tablet 1   pantoprazole (PROTONIX) 20 MG tablet Take 1 tablet (20 mg total) by mouth daily. 60 tablet 2   promethazine (PHENERGAN) 25 MG tablet Take 1 tablet (25 mg total) by mouth every 6 (six) hours as needed for nausea or vomiting. 30 tablet 0   SPIRIVA HANDIHALER 18 MCG inhalation capsule INHALE 1 CAPSULE VIA HANDIHALER ONCE DAILY AT THE SAME TIME EVERY DAY 30 capsule 0   vitamin B-12 (CYANOCOBALAMIN) 1000 MCG tablet Take 1 tablet (1,000 mcg total) by mouth daily. 90 tablet 1   hydrALAZINE (APRESOLINE) 100 MG tablet Take 50 mg by mouth 2 (two) times daily. (Patient not taking: Reported on 09/14/2021)     No current facility-administered medications for this visit.   Facility-Administered Medications Ordered in Other Visits  Medication Dose Route Frequency Provider Last Rate Last Admin   dexamethasone (DECADRON) 10 MG/ML injection            heparin lock flush 100 unit/mL  500 Units Intravenous Once Earlie Server, MD       heparin lock flush 100 unit/mL  500 Units Intravenous Once Earlie Server, MD       sodium chloride flush (NS) 0.9 % injection 10 mL  10 mL Intravenous PRN Earlie Server, MD   10 mL at 04/04/19 0903   sodium chloride flush (NS) 0.9 % injection 10 mL  10 mL Intravenous PRN Earlie Server, MD   10 mL at 07/26/20 1308   sodium chloride flush (NS) 0.9 % injection 10 mL  10 mL Intravenous PRN Earlie Server, MD   10 mL at 07/28/21 0828     PHYSICAL EXAMINATION: ECOG PERFORMANCE STATUS: 1 - Symptomatic but completely ambulatory Vitals:   09/14/21 0833  BP: 116/76  Pulse: 88  Resp: 18  Temp: (!) 97.5 F (36.4 C)  SpO2: 100%   Filed Weights   09/14/21 0833  Weight: 130 lb 1.6 oz (59 kg)    Physical Exam Constitutional:      General: He is not in acute distress. HENT:     Head: Normocephalic and atraumatic.     Mouth/Throat:     Comments:   Eyes:      General: No scleral icterus. Cardiovascular:     Rate and Rhythm: Normal rate and regular rhythm.     Heart sounds: Normal heart sounds.  Pulmonary:     Effort: Pulmonary effort  is normal. No respiratory distress.     Breath sounds: No wheezing.     Comments: Decreased breath sound bilaterally Abdominal:     General: Bowel sounds are normal. There is no distension.     Palpations: Abdomen is soft.  Musculoskeletal:        General: No deformity. Normal range of motion.     Cervical back: Normal range of motion and neck supple.  Skin:    General: Skin is warm and dry.     Findings: No erythema or rash.  Neurological:     Mental Status: He is alert and oriented to person, place, and time. Mental status is at baseline.     Cranial Nerves: No cranial nerve deficit.     Coordination: Coordination normal.  Psychiatric:        Mood and Affect: Mood normal.   Left forearm ulnar side mild tenderness along superficial vein.  LABORATORY DATA:  I have reviewed the data as listed Lab Results  Component Value Date   WBC 24.7 (H) 09/14/2021   HGB 10.6 (L) 09/14/2021   HCT 31.0 (L) 09/14/2021   MCV 114.8 (H) 09/14/2021   PLT 210 09/14/2021   Recent Labs    08/15/21 0820 08/24/21 0823 09/14/21 0813  NA 132* 132* 136  K 3.3* 5.2* 4.0  CL 97* 102 102  CO2 24 19* 21*  GLUCOSE 134* 143* 175*  BUN _0 CREATININE 0.79 1.00 0.89  CALCIUM 8.7* 8.8* 8.5*  GFRNONAA >60 >60 >60  PROT 7.2 7.6 7.1  ALBUMIN 3.4* 3.3* 3.0*  AST _1 ALT _2 ALKPHOS 122 112 100  BILITOT 0.3 <0.1* 0.4    Iron/TIBC/Ferritin/ %Sat No results found for: IRON, TIBC, FERRITIN, IRONPCTSAT   RADIOGRAPHIC STUDIES: I have personally reviewed the radiological images as listed and agreed with the findings in the report. CT Chest W Contrast  Addendum Date: 07/19/2021   ADDENDUM REPORT: 07/19/2021 10:45 ADDENDUM: As described in the original report there is a thick-walled cavitary lesion in the anterior  aspect of the right lower lobe which is new compared to the prior examination from 12/15/2020, and clearly enlarged when compared to the more recent prior study 04/22/2021, highly concerning for a metastatic lesion, particularly in light of the cell type of the patient's primary lesion (squamous cell carcinoma). Multiple other smaller pulmonary nodules are also noted elsewhere in the lungs, many of which are stable compared to prior studies, but several are new or clearly enlarged. The best example of an enlarging pulmonary nodule is in the left upper lobe (axial image 43 of series 3) measuring 8 x 6 mm on today's examination (previously only 4 x 3 mm on 04/22/2021). Electronically Signed   By: Vinnie Langton M.D.   On: 07/19/2021 10:45   Result Date: 07/19/2021 CLINICAL DATA:  Restaging squamous cell carcinoma of the lung. EXAM: CT CHEST WITH CONTRAST TECHNIQUE: Multidetector CT imaging of the chest was performed during intravenous contrast administration. CONTRAST:  18m OMNIPAQUE IOHEXOL 300 MG/ML  SOLN COMPARISON:  12/15/2020 FINDINGS: Cardiovascular: Heart size is within normal limits. There is a small pericardial effusion, increased from previous exam. Aortic atherosclerosis and coronary artery calcifications. Mediastinum/Nodes: Normal appearance of the thyroid gland. The trachea appears patent and is midline. Normal appearance of the esophagus. No enlarged axillary, supraclavicular, mediastinal, or hilar lymph nodes. Lungs/Pleura: New small right pleural effusion. Bandlike area of fibrosis and masslike architectural distortion extends across the right midlung  and is compatible with changes secondary to external beam radiation. There is associated volume loss within the right lung secondary to right upper lobectomy. Within the anterior right lung base there is a new, thick walled cavitary process which measures 4.1 by 2.3 by 3.4 cm, image 91/2 and image 57/4. Small nodule within the lateral right lung base  measures 5 mm, image 117/3. New from previous exam. Subpleural nodule within the posterior right lower lobe measures 4 mm and appears decreased from 5 mm previously, image 55/3. 3 mm left upper lobe lung nodule, image 58/3. Previously 2 mm. New nodule in the periphery of the left midlung measures 4 mm, image 102/3 Upper Abdomen: No acute abnormality. Musculoskeletal: No chest wall abnormality. No acute or significant osseous findings. IMPRESSION: 1. There is a new, thick walled cavitary process within the anterior right lung base measuring up to 4.1 cm. Cannot exclude tumor recurrence. Consider further evaluation with PET-CT. 2. New small right pleural effusion. 3. Several new or enlarging pulmonary nodules are identified within the right lung. Suspicious for metastatic disease. 4. Small pericardial effusion, increased from previous exam. 5. Aortic Atherosclerosis (ICD10-I70.0). Electronically Signed: By: Kerby Moors M.D. On: 07/15/2021 18:19      ASSESSMENT & PLAN:  1. Squamous cell carcinoma of right lung ([place])   2. Encounter for antineoplastic chemotherapy   3. Antineoplastic chemotherapy induced anemia   4. History of thrombosis   5. Other emphysema ([place])    #Recurrent Squamous cell carcinoma of lung, stage IV 07/16/2020-09/28/2020 carboplatin Taxol and Keytruda x 1  partial response -Keytruda maintenance.-04/22/2021, progression-resumed on carboplatin/Taxol/Keytruda - did not tolerate due to myelosuppression.-->  Taxol held, carboplatin/Keytruda--> CT 07/2020 progression --> 08/03/2021 docetaxel and ramucirumab. Labs are reviewed and discussed with patient. Proceed with cycle 3 docetaxel and ramucirumab.  day 3 G-CSF support is cancelled as patient plans to be out of town.  Monitor cbc in 1 and 2 weeks.   #Chemotherapy-induced anemia, hemoglobin stable.  Monitor.  #Weight loss, continue follow-up with nutritionist.  Continue nutrition supplements.    #Shortness of breath with exertion.   Combination of COPD/emphysema/lung cancer/symptomatic anemia.. Continue albuterol as needed, Symbicort.  Symptoms are stable.  #Unprovoked left lower extremity DVT, status post thrombolysis/thrombectomy, Nonocclusive PE noted on recent CT. Patient has completed 6+ months of therapeutic dose of Eliquis.  Continue Eliquis 2.5 mg twice daily. Superficial thrombophlebitis secondary to previous IV.  Recommend patient to apply warm compresses  #Chronic hypomagnesia.  Continue slow magnesium twice daily.    return of visit:  Lab MD 3 weeks docetaxel/ramucirumab/G-CSF.   Earlie Server, MD, PhD  09/14/2021

## 2021-09-16 ENCOUNTER — Encounter: Payer: Self-pay | Admitting: Oncology

## 2021-09-16 ENCOUNTER — Inpatient Hospital Stay: Payer: 59

## 2021-09-16 ENCOUNTER — Telehealth: Payer: Self-pay | Admitting: *Deleted

## 2021-09-16 NOTE — Telephone Encounter (Signed)
09/15/21- RN Received critical illness form. Form completed on 09/15/21. Dr. Collie Siad signed form on 09/16/21. ? ?I contacted wife and left a vm to let her know that the form has been completed. Mychart msg also sent to patient/family ?

## 2021-09-16 NOTE — Addendum Note (Signed)
Addended by: Earlie Server on: 09/16/2021 09:24 PM ? ? Modules accepted: Orders ? ?

## 2021-09-20 ENCOUNTER — Encounter: Payer: Self-pay | Admitting: Oncology

## 2021-09-20 NOTE — Telephone Encounter (Signed)
09/20/21- at 1025. I spoke with Wife. She requested that I send a secure email to be sent to '[email or web address] with the form in pdf file. She needs to upload this to the portal. This was sent per her request.  Pt will pick up the original copy at his lab apt tom. ?

## 2021-09-21 ENCOUNTER — Other Ambulatory Visit: Payer: Self-pay

## 2021-09-21 ENCOUNTER — Inpatient Hospital Stay: Payer: 59

## 2021-09-21 DIAGNOSIS — C3491 Malignant neoplasm of unspecified part of right bronchus or lung: Secondary | ICD-10-CM

## 2021-09-21 DIAGNOSIS — Z5112 Encounter for antineoplastic immunotherapy: Secondary | ICD-10-CM | POA: Diagnosis not present

## 2021-09-21 LAB — CBC WITH DIFFERENTIAL/PLATELET
Abs Immature Granulocytes: 0.11 10*3/uL — ABNORMAL HIGH (ref 0.00–0.07)
Basophils Absolute: 0.2 10*3/uL — ABNORMAL HIGH (ref 0.0–0.1)
Basophils Relative: 8 %
Eosinophils Absolute: 0.2 10*3/uL (ref 0.0–0.5)
Eosinophils Relative: 8 %
HCT: 29.4 % — ABNORMAL LOW (ref 39.0–52.0)
Hemoglobin: 10 g/dL — ABNORMAL LOW (ref 13.0–17.0)
Immature Granulocytes: 4 %
Lymphocytes Relative: 38 %
Lymphs Abs: 1.1 10*3/uL (ref 0.7–4.0)
MCH: 38.3 pg — ABNORMAL HIGH (ref 26.0–34.0)
MCHC: 34 g/dL (ref 30.0–36.0)
MCV: 112.6 fL — ABNORMAL HIGH (ref 80.0–100.0)
Monocytes Absolute: 0.3 10*3/uL (ref 0.1–1.0)
Monocytes Relative: 9 %
Neutro Abs: 0.9 10*3/uL — ABNORMAL LOW (ref 1.7–7.7)
Neutrophils Relative %: 33 %
Platelets: 118 10*3/uL — ABNORMAL LOW (ref 150–400)
RBC: 2.61 MIL/uL — ABNORMAL LOW (ref 4.22–5.81)
RDW: 14.3 % (ref 11.5–15.5)
Smear Review: NORMAL
WBC: 2.8 10*3/uL — ABNORMAL LOW (ref 4.0–10.5)
nRBC: 0 % (ref 0.0–0.2)

## 2021-09-28 ENCOUNTER — Other Ambulatory Visit: Payer: Self-pay

## 2021-09-28 ENCOUNTER — Inpatient Hospital Stay: Payer: 59

## 2021-09-28 DIAGNOSIS — Z5112 Encounter for antineoplastic immunotherapy: Secondary | ICD-10-CM | POA: Diagnosis not present

## 2021-09-28 DIAGNOSIS — C3491 Malignant neoplasm of unspecified part of right bronchus or lung: Secondary | ICD-10-CM

## 2021-09-28 LAB — CBC WITH DIFFERENTIAL/PLATELET
Abs Immature Granulocytes: 0.21 10*3/uL — ABNORMAL HIGH (ref 0.00–0.07)
Basophils Absolute: 0 10*3/uL (ref 0.0–0.1)
Basophils Relative: 0 %
Eosinophils Absolute: 0 10*3/uL (ref 0.0–0.5)
Eosinophils Relative: 0 %
HCT: 31.3 % — ABNORMAL LOW (ref 39.0–52.0)
Hemoglobin: 10.6 g/dL — ABNORMAL LOW (ref 13.0–17.0)
Immature Granulocytes: 1 %
Lymphocytes Relative: 5 %
Lymphs Abs: 0.9 10*3/uL (ref 0.7–4.0)
MCH: 36.9 pg — ABNORMAL HIGH (ref 26.0–34.0)
MCHC: 33.9 g/dL (ref 30.0–36.0)
MCV: 109.1 fL — ABNORMAL HIGH (ref 80.0–100.0)
Monocytes Absolute: 0.1 10*3/uL (ref 0.1–1.0)
Monocytes Relative: 1 %
Neutro Abs: 15.6 10*3/uL — ABNORMAL HIGH (ref 1.7–7.7)
Neutrophils Relative %: 93 %
Platelets: 154 10*3/uL (ref 150–400)
RBC: 2.87 MIL/uL — ABNORMAL LOW (ref 4.22–5.81)
RDW: 16.1 % — ABNORMAL HIGH (ref 11.5–15.5)
WBC: 16.9 10*3/uL — ABNORMAL HIGH (ref 4.0–10.5)
nRBC: 0.2 % (ref 0.0–0.2)

## 2021-10-05 ENCOUNTER — Inpatient Hospital Stay: Payer: 59

## 2021-10-05 ENCOUNTER — Other Ambulatory Visit: Payer: Self-pay

## 2021-10-05 ENCOUNTER — Encounter: Payer: Self-pay | Admitting: Oncology

## 2021-10-05 ENCOUNTER — Inpatient Hospital Stay (HOSPITAL_BASED_OUTPATIENT_CLINIC_OR_DEPARTMENT_OTHER): Payer: 59 | Admitting: Oncology

## 2021-10-05 VITALS — BP 147/79 | HR 94 | Temp 96.8°F | Wt 123.0 lb

## 2021-10-05 DIAGNOSIS — Z86718 Personal history of other venous thrombosis and embolism: Secondary | ICD-10-CM

## 2021-10-05 DIAGNOSIS — C3491 Malignant neoplasm of unspecified part of right bronchus or lung: Secondary | ICD-10-CM

## 2021-10-05 DIAGNOSIS — D6481 Anemia due to antineoplastic chemotherapy: Secondary | ICD-10-CM

## 2021-10-05 DIAGNOSIS — Z5111 Encounter for antineoplastic chemotherapy: Secondary | ICD-10-CM

## 2021-10-05 DIAGNOSIS — E860 Dehydration: Secondary | ICD-10-CM

## 2021-10-05 DIAGNOSIS — R197 Diarrhea, unspecified: Secondary | ICD-10-CM

## 2021-10-05 DIAGNOSIS — T451X5A Adverse effect of antineoplastic and immunosuppressive drugs, initial encounter: Secondary | ICD-10-CM

## 2021-10-05 DIAGNOSIS — A498 Other bacterial infections of unspecified site: Secondary | ICD-10-CM

## 2021-10-05 DIAGNOSIS — Z95828 Presence of other vascular implants and grafts: Secondary | ICD-10-CM

## 2021-10-05 DIAGNOSIS — Z5112 Encounter for antineoplastic immunotherapy: Secondary | ICD-10-CM | POA: Diagnosis not present

## 2021-10-05 HISTORY — DX: Other bacterial infections of unspecified site: A49.8

## 2021-10-05 LAB — CBC WITH DIFFERENTIAL/PLATELET
Abs Immature Granulocytes: 0.17 10*3/uL — ABNORMAL HIGH (ref 0.00–0.07)
Basophils Absolute: 0 10*3/uL (ref 0.0–0.1)
Basophils Relative: 0 %
Eosinophils Absolute: 0 10*3/uL (ref 0.0–0.5)
Eosinophils Relative: 0 %
HCT: 28.9 % — ABNORMAL LOW (ref 39.0–52.0)
Hemoglobin: 9.7 g/dL — ABNORMAL LOW (ref 13.0–17.0)
Immature Granulocytes: 1 %
Lymphocytes Relative: 3 %
Lymphs Abs: 0.5 10*3/uL — ABNORMAL LOW (ref 0.7–4.0)
MCH: 36.9 pg — ABNORMAL HIGH (ref 26.0–34.0)
MCHC: 33.6 g/dL (ref 30.0–36.0)
MCV: 109.9 fL — ABNORMAL HIGH (ref 80.0–100.0)
Monocytes Absolute: 0 10*3/uL — ABNORMAL LOW (ref 0.1–1.0)
Monocytes Relative: 0 %
Neutro Abs: 18.9 10*3/uL — ABNORMAL HIGH (ref 1.7–7.7)
Neutrophils Relative %: 96 %
Platelets: 298 10*3/uL (ref 150–400)
RBC: 2.63 MIL/uL — ABNORMAL LOW (ref 4.22–5.81)
RDW: 17.2 % — ABNORMAL HIGH (ref 11.5–15.5)
WBC: 19.6 10*3/uL — ABNORMAL HIGH (ref 4.0–10.5)
nRBC: 0 % (ref 0.0–0.2)

## 2021-10-05 LAB — COMPREHENSIVE METABOLIC PANEL
ALT: 7 U/L (ref 0–44)
AST: 16 U/L (ref 15–41)
Albumin: 3.1 g/dL — ABNORMAL LOW (ref 3.5–5.0)
Alkaline Phosphatase: 83 U/L (ref 38–126)
Anion gap: 12 (ref 5–15)
BUN: 14 mg/dL (ref 6–20)
CO2: 20 mmol/L — ABNORMAL LOW (ref 22–32)
Calcium: 9 mg/dL (ref 8.9–10.3)
Chloride: 102 mmol/L (ref 98–111)
Creatinine, Ser: 0.89 mg/dL (ref 0.61–1.24)
GFR, Estimated: >60 mL/min (ref >60)
Glucose, Bld: 163 mg/dL — ABNORMAL HIGH (ref 70–99)
Potassium: 4.4 mmol/L (ref 3.5–5.1)
Sodium: 134 mmol/L — ABNORMAL LOW (ref 135–145)
Total Bilirubin: 0.3 mg/dL (ref 0.3–1.2)
Total Protein: 7.2 g/dL (ref 6.5–8.1)

## 2021-10-05 LAB — C DIFFICILE QUICK SCREEN W PCR REFLEX
C Diff antigen: POSITIVE — AB
C Diff toxin: NEGATIVE

## 2021-10-05 LAB — CLOSTRIDIUM DIFFICILE BY PCR, REFLEXED: Toxigenic C. Difficile by PCR: POSITIVE — AB

## 2021-10-05 LAB — TSH: TSH: 0.511 u[IU]/mL (ref 0.350–4.500)

## 2021-10-05 LAB — PROTEIN, URINE, RANDOM: Total Protein, Urine: 32 mg/dL

## 2021-10-05 MED ORDER — HEPARIN SOD (PORK) LOCK FLUSH 100 UNIT/ML IV SOLN
500.0000 [IU] | Freq: Once | INTRAVENOUS | Status: AC
  Administered 2021-10-05: 500 [IU] via INTRAVENOUS
  Filled 2021-10-05: qty 5

## 2021-10-05 MED ORDER — SODIUM CHLORIDE 0.9 % IV SOLN
Freq: Once | INTRAVENOUS | Status: AC
  Filled 2021-10-05: qty 250

## 2021-10-05 MED ORDER — SODIUM CHLORIDE 0.9% FLUSH
10.0000 mL | Freq: Once | INTRAVENOUS | Status: AC
  Administered 2021-10-05: 10 mL via INTRAVENOUS
  Filled 2021-10-05: qty 10

## 2021-10-05 MED ORDER — LOPERAMIDE HCL 2 MG PO CAPS: 2.0000 mg | ORAL_CAPSULE | ORAL | 0 refills | Status: DC

## 2021-10-05 NOTE — Progress Notes (Addendum)
?Hematology/Oncology Progress note ?[phone] [phone] ?  ? ? ? ?Patient Care Team: ?Tracie Harrier, MD as PCP - General (Internal Medicine) ?Earlie Server, MD as Consulting Physician (Hematology and Oncology) ? ?REASON FOR VISIT:  ?Follow up for treatment of squamous lung cancer.  ? ?HISTORY OF PRESENTING ILLNESS:  ?# Dec 2019 Stage I squamous lung cancer ?#s/p Bronchoscopy on 06/25/2018. subcarina EBUS FNA was non diagnostic, hypocellular specimen.  ?# 08/13/2018 CT guided right upper lobe biopsy pathology showed dense fibrosis and mixed inflammatory cells with prominent polytypic plasma cells component. Focal benign bronchial wall and alveolar spaces. No malignancy was identified.  ? ?# 08/13/2018 underwent right thoracotomy and wedge resection of a right upper lobe mass.  Frozen section was consistent with an inflammatory process.  On the second postop day, preliminary pathology reports high-grade malignancy.  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  08/20/2018 Patient was therefore taken back to the OR and underwent take complete lobectomy  pT1b pN0 cM0 stage I squamous cell lung cancer.  ?Margin is negative.  Recommend observation.   ? ?# 12/03/2018 CT chest w contrast showed local recurrence.  ?12/10/2018 PET showed No evidence of distant metastatic disease ?# 12/26/2018 s/p bronchoscopy biopsy. Confirmed local recurrence of squamous cell lung cancer.  ?medi port placed by Dr.Oaks. ? ?# 06/14/2020 CT chest w contrast was reviewed and discussed with patient.  ?Evidence of progressive lymphangitic spread of tumor throughout the right lung, with contralateral lung nodules with  right pleural effusion  ?MRI brain is negative for CNS involvement.  CT abdomen with contrast showed no evidence of abdominal metastatic disease ?PET scan was not approved by insurance-peer to peer appeal with Dr.Vipul Bhanderi  ? ?# NGS - KRAS V14L,  TMB 26.6-high, MS stable, PD-L1 5% ?CDKN2C G48,  DICER1c.2117-1G>T, FLT4 G1131S, NF1 A461f, NF1 LR4270, STK11 N1866f?Cancer treatment ?Started concurrent chemoradiation on 01/16/2019 ?Carboplatin AUC of 2 and Taxol 45 mg/m2 weekly finished in 03/05/2019 ?04/10/2019, patient started on durvalumab maintenance. ?-CT showed progression.  Insurance denied PET scan evaluation. ?07/16/2020-09/28/2020 4 cycles of carboplatin/paclitaxel/Keytruda ? ?10/15/2020, CT chest abdomen pelvis redemonstrated postoperative and postradiation appearance of the right chest with perihilar consolidation and fibrosis.  Slight interval decrease in size of the pulmonary nodules associated with interlobular septal thickening nearly complete resolution of previously seen left-sided pulmonary nodules.  Minimal irregular residual.  Right pleural effusion improved.  Consistent with treatment response.  No evidence of metastatic disease with the abdomen or pelvis.  None obstructive bilateral nephrolithiasis ?- partial response to the treatment- ?10/19/2020, Keytruda monotherapy maintenance ? ?12/15/2020, CT chest without contrast showed resolution of lung nodules, no evidence of recurrent disease ?12/30/2020, patient was admitted due to unprovoked extensive deep vein thrombosis from calf vein to the common femoral vein on the left.  Patient was started on heparin drip.  Patient underwent thrombolysis and is a colectomy on 12/31/2020.  Anticoagulation was switched to Eliquis at discharge. ? ?04/22/2021, CT chest with contrast showed none occlusive pulmonary embolism, ?Interval progression of consolidation airspace opacity in the medial right middle lobe with bandlike opacity and bronchiectasis in the perihilar right lung,-this is compatible with evolving posttreatment changes although recurrent disease anteriorly is not excluded. ?Interval development of numerous bilateral pulmonary nodules, measuring up to 11 mm in the right lung base.  Concerning for metastatic disease.  Tiny right pleural effusion.   ? ?04/26/2021, resumed on carboplatin/Taxol/Keytruda. ?Patient did not tolerate his chemotherapy of carboplatin/Taxol/Keytruda on 04/26/2021. ?Patient has developed severe anemia status post  PRBC transfusion. ?05/25/2021, carboplatin AUC 5 with Keytruda ?06/23/2021  continued on carboplatin AUC 5 with Keytruda ?07/07/2021 carboplatin with [name]  ? ?07/15/2021 CT chest with contrast showed enlarged right lower lobe thick-walled cavitary lesion in the anterior aspect of the right lower lobe.  Concerning for progression.  Multiple other small pulmonary nodules are also noted elsewhere in the lungs, many of which are stable compared to prior studies.  Several are new or clearly enlarged.  The best example is an enlarging pulmonary nodule in the left upper lobe, 8 x 6 mm comparing to 4 x 3 mm on 04/22/2021. ? ?#January 2023 patient was hospitalized due to hyperkalemia. Patient was given calcium gluconate, nebulized albuterol and IV Lasix.  He was observed overnight. potassium improved and patient was discharged.  He has stopped taking potassium supplementation. ? ?INTERVAL HISTORY ?Samuel Choi is a 56 y.o. male who has above history reviewed by me today presents for reatment of  recurrent  squamous cell carcinoma. ? ?Patient feels well after last chemotherapy.  He went on a cruise trip and enjoyed most part of it.  On his way back home, he starts to feel sick, decreased appetite, multiple loose bowel movements.  His wife also is sick. ?No nausea vomiting. ?Patient has lost weight.  No fever or chills abdominal pain. ? ?  Review of Systems  ?Constitutional:  Positive for fatigue. Negative for appetite change, chills and fever.  ?HENT:   Negative for hearing loss and voice change.   ?Eyes:  Negative for eye problems and icterus.  ?Respiratory:  Positive for shortness of breath. Negative for chest tightness and cough.   ?Cardiovascular:  Negative for chest pain and leg swelling.  ?Gastrointestinal:  Negative for  abdominal distention, abdominal pain, nausea and vomiting.  ?Endocrine: Negative for hot flashes.  ?Genitourinary:  Negative for difficulty urinating, dysuria and frequency.   ?Musculoskeletal:  Negative for arthralgias.  ?Skin:  Negative for itching and rash.  ?Neurological:  Negative for light-headedness and numbness.  ?Hematological:  Negative for adenopathy. Does not bruise/bleed easily.  ?Psychiatric/Behavioral:  Negative for confusion.   ? ?MEDICAL HISTORY:  ?Past Medical History:  ?Diagnosis Date  ? Alcohol abuse   ? usually drinks 2-3 drinks per day  ? Atherosclerosis 06/2018  ? Chronic sinusitis   ? Dehydration 02/07/2019  ? Emphysema of lung ([place]) 06/2018  ? patient unaware of this.  ? Hip fracture ([place]) 05/2018  ? no surgery  ? History of kidney stones 05/2018  ? per xray, bilateral nephrolitiasis  ? Hypertension   ? Squamous cell carcinoma of lung, right ([place]) 06/2018  ? ? ?SURGICAL HISTORY: ?Past Surgical History:  ?Procedure Laterality Date  ? BRAIN SURGERY  10/2017  ? nasal/sinus endoscopy. mass benign  ? ELECTROMAGNETIC NAVIGATION BROCHOSCOPY Right 06/25/2018  ? Procedure: ELECTROMAGNETIC NAVIGATION BRONCHOSCOPY;  Surgeon: [name], MD;  Location: [facility];  Service: Cardiopulmonary;  Laterality: Right;  ? ENDOBRONCHIAL ULTRASOUND Right 06/25/2018  ? Procedure: ENDOBRONCHIAL ULTRASOUND;  Surgeon: [name], MD;  Location: [facility];  Service: Cardiopulmonary;  Laterality: Right;  ? ENDOBRONCHIAL ULTRASOUND Right 12/26/2018  ? Procedure: ENDOBRONCHIAL ULTRASOUND RIGHT;  Surgeon: [name], MD;  Location: [facility];  Service: Cardiopulmonary;  Laterality: Right;  ? FLEXIBLE BRONCHOSCOPY N/A 08/20/2018  ? Procedure: FLEXIBLE BRONCHOSCOPY PREOP;  Surgeon: [name], MD;  Location: [facility];  Service: Thoracic;  Laterality: N/A;  ? IR CV LINE INJECTION  04/04/2019  ? NASAL SINUS SURGERY  10/2017  ? At [facility], frontal  sinusotomy, ethmoidectomy, resection anterior cranial fossa neoplasm, turbinate  resection  ? PERIPHERAL VASCULAR THROMBECTOMY Left 12/31/2020  ? Procedure: PERIPHERAL VASCULAR THROMBECTOMY;  Surgeon: Algernon Huxley, MD;  Location: [place] [facility];  Service: Cardiovascular;  Laterality: Left;  ? PORTACATH PLAC

## 2021-10-05 NOTE — Progress Notes (Signed)
No chemotherapy today. Received IV hydration. Gave pt a stool collection kit to take home. Instructed to bring sample back to the cancer center front desk between the hours 8:30 - 4 pm Mon - Friday. Pt was able to provide stool specimen just prior to discharge. ?

## 2021-10-05 NOTE — Progress Notes (Signed)
Patient here for follow up. Patient states he has very little energy. Appetite has decreased. ?

## 2021-10-06 ENCOUNTER — Telehealth: Payer: Self-pay

## 2021-10-06 LAB — T3, FREE: T3, Free: 2.2 pg/mL (ref 2.0–4.4)

## 2021-10-06 MED ORDER — DIFICID 200 MG PO TABS: 200.0000 mg | ORAL_TABLET | Freq: Two times a day (BID) | ORAL | 0 refills | Status: DC

## 2021-10-06 NOTE — Addendum Note (Signed)
Addended by: Earlie Server on: 10/06/2021 08:37 AM ? ? Modules accepted: Orders ? ?

## 2021-10-06 NOTE — Telephone Encounter (Signed)
Patient informed of test resutls and Follow up plan and verbalized understanding.  ? ?Please cancel tx next week and change to lab/ NP/ IVF. Please update pt if there is any change in date/time.  ?

## 2021-10-06 NOTE — Telephone Encounter (Signed)
-----   Message from Earlie Server, MD sent at 10/06/2021  8:40 AM EDT ----- ?Please let him know that his stool sample is positive for C diff.  ?Recommend him to take antibiotics Dificid 200 mg twice daily for 10 days. Do not use imodium.  ?Rx sent. Please educate him about  precaution at home.  ?Thanks. Cancel next week's treatment appt with me. Please change his appt to lab NP + IVF.  ? ? ?

## 2021-10-07 ENCOUNTER — Ambulatory Visit: Payer: 59

## 2021-10-11 ENCOUNTER — Other Ambulatory Visit: Payer: 59

## 2021-10-11 DIAGNOSIS — R972 Elevated prostate specific antigen [PSA]: Secondary | ICD-10-CM

## 2021-10-12 ENCOUNTER — Inpatient Hospital Stay: Payer: 59 | Attending: Oncology

## 2021-10-12 ENCOUNTER — Encounter: Payer: Self-pay | Admitting: Hospice and Palliative Medicine

## 2021-10-12 ENCOUNTER — Ambulatory Visit: Payer: 59

## 2021-10-12 ENCOUNTER — Ambulatory Visit: Payer: 59 | Admitting: Oncology

## 2021-10-12 ENCOUNTER — Inpatient Hospital Stay: Payer: 59

## 2021-10-12 ENCOUNTER — Inpatient Hospital Stay (HOSPITAL_BASED_OUTPATIENT_CLINIC_OR_DEPARTMENT_OTHER): Payer: 59 | Admitting: Hospice and Palliative Medicine

## 2021-10-12 VITALS — BP 123/73 | HR 95 | Temp 97.8°F | Resp 18 | Wt 125.4 lb

## 2021-10-12 DIAGNOSIS — D6481 Anemia due to antineoplastic chemotherapy: Secondary | ICD-10-CM | POA: Insufficient documentation

## 2021-10-12 DIAGNOSIS — Z86718 Personal history of other venous thrombosis and embolism: Secondary | ICD-10-CM | POA: Insufficient documentation

## 2021-10-12 DIAGNOSIS — Z5111 Encounter for antineoplastic chemotherapy: Secondary | ICD-10-CM | POA: Diagnosis present

## 2021-10-12 DIAGNOSIS — C3491 Malignant neoplasm of unspecified part of right bronchus or lung: Secondary | ICD-10-CM

## 2021-10-12 DIAGNOSIS — C3411 Malignant neoplasm of upper lobe, right bronchus or lung: Secondary | ICD-10-CM | POA: Diagnosis present

## 2021-10-12 DIAGNOSIS — A09 Infectious gastroenteritis and colitis, unspecified: Secondary | ICD-10-CM

## 2021-10-12 DIAGNOSIS — Z79899 Other long term (current) drug therapy: Secondary | ICD-10-CM | POA: Diagnosis not present

## 2021-10-12 DIAGNOSIS — Z87891 Personal history of nicotine dependence: Secondary | ICD-10-CM | POA: Diagnosis not present

## 2021-10-12 DIAGNOSIS — E860 Dehydration: Secondary | ICD-10-CM

## 2021-10-12 DIAGNOSIS — Z5112 Encounter for antineoplastic immunotherapy: Secondary | ICD-10-CM | POA: Diagnosis present

## 2021-10-12 DIAGNOSIS — R197 Diarrhea, unspecified: Secondary | ICD-10-CM | POA: Diagnosis not present

## 2021-10-12 DIAGNOSIS — Z7901 Long term (current) use of anticoagulants: Secondary | ICD-10-CM | POA: Insufficient documentation

## 2021-10-12 DIAGNOSIS — Z5189 Encounter for other specified aftercare: Secondary | ICD-10-CM | POA: Insufficient documentation

## 2021-10-12 LAB — CBC WITH DIFFERENTIAL/PLATELET
Abs Immature Granulocytes: 0.05 10*3/uL (ref 0.00–0.07)
Basophils Absolute: 0.2 10*3/uL — ABNORMAL HIGH (ref 0.0–0.1)
Basophils Relative: 1 %
Eosinophils Absolute: 0.3 10*3/uL (ref 0.0–0.5)
Eosinophils Relative: 2 %
HCT: 32 % — ABNORMAL LOW (ref 39.0–52.0)
Hemoglobin: 10.6 g/dL — ABNORMAL LOW (ref 13.0–17.0)
Immature Granulocytes: 0 %
Lymphocytes Relative: 15 %
Lymphs Abs: 2.1 10*3/uL (ref 0.7–4.0)
MCH: 36.3 pg — ABNORMAL HIGH (ref 26.0–34.0)
MCHC: 33.1 g/dL (ref 30.0–36.0)
MCV: 109.6 fL — ABNORMAL HIGH (ref 80.0–100.0)
Monocytes Absolute: 0.9 10*3/uL (ref 0.1–1.0)
Monocytes Relative: 6 %
Neutro Abs: 10.4 10*3/uL — ABNORMAL HIGH (ref 1.7–7.7)
Neutrophils Relative %: 76 %
Platelets: 223 10*3/uL (ref 150–400)
RBC: 2.92 MIL/uL — ABNORMAL LOW (ref 4.22–5.81)
RDW: 16.6 % — ABNORMAL HIGH (ref 11.5–15.5)
WBC: 13.9 10*3/uL — ABNORMAL HIGH (ref 4.0–10.5)
nRBC: 0 % (ref 0.0–0.2)

## 2021-10-12 LAB — COMPREHENSIVE METABOLIC PANEL
ALT: 8 U/L (ref 0–44)
AST: 18 U/L (ref 15–41)
Albumin: 3.3 g/dL — ABNORMAL LOW (ref 3.5–5.0)
Alkaline Phosphatase: 81 U/L (ref 38–126)
Anion gap: 9 (ref 5–15)
BUN: 10 mg/dL (ref 6–20)
CO2: 24 mmol/L (ref 22–32)
Calcium: 9 mg/dL (ref 8.9–10.3)
Chloride: 104 mmol/L (ref 98–111)
Creatinine, Ser: 0.83 mg/dL (ref 0.61–1.24)
GFR, Estimated: >60 mL/min (ref >60)
Glucose, Bld: 91 mg/dL (ref 70–99)
Potassium: 4 mmol/L (ref 3.5–5.1)
Sodium: 137 mmol/L (ref 135–145)
Total Bilirubin: 0.3 mg/dL (ref 0.3–1.2)
Total Protein: 7.3 g/dL (ref 6.5–8.1)

## 2021-10-12 LAB — PSA: Prostate Specific Ag, Serum: 5.1 ng/mL — ABNORMAL HIGH (ref 0.0–4.0)

## 2021-10-12 MED ORDER — ONDANSETRON HCL 8 MG PO TABS: 8.0000 mg | ORAL_TABLET | Freq: Three times a day (TID) | ORAL | 1 refills | Status: DC | PRN

## 2021-10-12 MED ORDER — HYDROCODONE-ACETAMINOPHEN 5-325 MG PO TABS: 1.0000 | ORAL_TABLET | Freq: Three times a day (TID) | ORAL | 0 refills | Status: DC | PRN

## 2021-10-12 MED ORDER — HEPARIN SOD (PORK) LOCK FLUSH 100 UNIT/ML IV SOLN
500.0000 [IU] | Freq: Once | INTRAVENOUS | Status: AC
  Administered 2021-10-12: 500 [IU] via INTRAVENOUS
  Filled 2021-10-12: qty 5

## 2021-10-12 MED ORDER — SODIUM CHLORIDE 0.9 % IV SOLN
Freq: Once | INTRAVENOUS | Status: AC
  Filled 2021-10-12: qty 250

## 2021-10-12 MED ORDER — SODIUM CHLORIDE 0.9% FLUSH
10.0000 mL | Freq: Once | INTRAVENOUS | Status: AC | PRN
  Administered 2021-10-12: 10 mL
  Filled 2021-10-12: qty 10

## 2021-10-12 NOTE — Progress Notes (Signed)
? ?[facility] ?[place] [facility] ?Telephone:(336) [phone] Fax:(336) [phone] ? ?Patient Care Team: ?Tracie Harrier, MD as PCP - General (Internal Medicine) ?Earlie Server, MD as Consulting Physician (Hematology and Oncology)  ? ?Name of the patient: Samuel Choi  ?[id number]  ?1965-10-27  ? ?Date of visit: 10/12/21 ? ?Reason for Consult: ?MILT COYE is a 56 y.o. 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  ? ?Patient saw Dr. Tasia Catchings on 10/05/2021 at which time patient was returning after recent cruise had diarrhea.  Patient was found to have C. difficile colitis.  He was started on Dificid 200 mg twice daily for 10 days.  This was started on 10/06/2021 and should be completed on 10/15/2021. ? ?Patient presents to [facility] today for labs and IV fluids. ? ?Today, patient reports he is doing well.  He reports resolution of diarrhea.  He is still on Dificid.  He denies any symptomatic complaints at present. ? ?Denies any neurologic complaints. Denies recent fevers or illnesses. Denies any easy bleeding or bruising. Reports good appetite and denies weight loss. Denies chest pain. Denies any nausea, vomiting, constipation, or diarrhea. Denies urinary complaints. Patient offers no further specific complaints today. ? ?Patient request refill of nausea and pain medications. ? ?PAST MEDICAL HISTORY: ?Past Medical History:  ?Diagnosis Date  ? Alcohol abuse   ? usually drinks 2-3 drinks per day  ? Atherosclerosis 06/2018  ? Chronic sinusitis   ? Dehydration 02/07/2019  ? Emphysema of lung ([place]) 06/2018  ? patient unaware of this.  ? Hip fracture ([place]) 05/2018  ? no surgery  ? History of kidney stones 05/2018  ? per xray, bilateral nephrolitiasis  ? Hypertension   ? Squamous cell carcinoma of lung, right ([place]) 06/2018  ? ? ?PAST SURGICAL HISTORY:  ?Past Surgical History:  ?Procedure Laterality Date  ? BRAIN  SURGERY  10/2017  ? nasal/sinus endoscopy. mass benign  ? ELECTROMAGNETIC NAVIGATION BROCHOSCOPY Right 06/25/2018  ? Procedure: ELECTROMAGNETIC NAVIGATION BRONCHOSCOPY;  Surgeon: [name], MD;  Location: [facility];  Service: Cardiopulmonary;  Laterality: Right;  ? ENDOBRONCHIAL ULTRASOUND Right 06/25/2018  ? Procedure: ENDOBRONCHIAL ULTRASOUND;  Surgeon: [name], MD;  Location: [facility];  Service: Cardiopulmonary;  Laterality: Right;  ? ENDOBRONCHIAL ULTRASOUND Right 12/26/2018  ? Procedure: ENDOBRONCHIAL ULTRASOUND RIGHT;  Surgeon: [name], MD;  Location: [facility];  Service: Cardiopulmonary;  Laterality: Right;  ? FLEXIBLE BRONCHOSCOPY N/A 08/20/2018  ? Procedure: FLEXIBLE BRONCHOSCOPY PREOP;  Surgeon: [name], MD;  Location: [facility];  Service: Thoracic;  Laterality: N/A;  ? IR CV LINE INJECTION  04/04/2019  ? NASAL SINUS SURGERY  10/2017  ? At [facility], frontal sinusotomy, ethmoidectomy, resection anterior cranial fossa neoplasm, turbinate resection  ? PERIPHERAL VASCULAR THROMBECTOMY Left 12/31/2020  ? Procedure: PERIPHERAL VASCULAR THROMBECTOMY;  Surgeon: Algernon Huxley, MD;  Location: [place] [facility];  Service: Cardiovascular;  Laterality: Left;  ? PORTACATH PLACEMENT Left 01/15/2019  ? Procedure: INSERTION PORT-A-CATH;  Surgeon: [name], MD;  Location: [facility];  Service: General;  Laterality: Left;  ? THORACOTOMY Right 08/13/2018  ? Procedure: PREOP BROCHOSCOPY WITH RIGHT THORACOTOMY AND RUL RESECTION;  Surgeon: [name], MD;  Location: [facility];  Service: General;  Laterality: Right;  ? THORACOTOMY Right 08/20/2018  ? Procedure: THORACOTOMY MAJOR RIGHT UPPER LOBE LOBECTOMY;  Surgeon: [name], MD;  Location: [facility];  Service: Thoracic;  Laterality: Right;  ? TOE SURGERY Left   ?  pin in left toe  ? ? ?HEMATOLOGY/ONCOLOGY HISTORY:  ?Oncology History  ?Squamous cell carcinoma of right lung ([place])  ?09/03/2018 Initial Diagnosis  ? Squamous cell carcinoma of lung, right ([place]) ?   ?01/17/2019 - 03/07/2019 Chemotherapy  ? The patient had palonosetron (ALOXI) injection 0.25 mg, 0.25 mg, Intravenous,  Once, 8 of 8 cycles ?Administration: 0.25 mg (01/17/2019), 0.25 mg (01/24/2019), 0.25 mg (02/14/2019), 0.25 mg (02/21/2019), 0.25 mg (02/28/2019), 0.25 mg (01/31/2019), 0.25 mg (02/07/2019), 0.25 mg (03/07/2019) ?CARBOplatin (PARAPLATIN) 200 mg in sodium chloride 0.9 % 250 mL chemo infusion, 200 mg (100 % of original dose 204.4 mg), Intravenous,  Once, 8 of 8 cycles ?Dose modification:   (original dose 204.4 mg, Cycle 1) ?Administration: 200 mg (01/17/2019), 200 mg (01/24/2019), 230 mg (02/14/2019), 240 mg (02/21/2019), 240 mg (02/28/2019), 200 mg (01/31/2019), 200 mg (02/07/2019), 240 mg (03/07/2019) ?PACLitaxel (TAXOL) 84 mg in sodium chloride 0.9 % 250 mL chemo infusion (</= 27m/m2), 45 mg/m2 = 84 mg, Intravenous,  Once, 8 of 8 cycles ?Administration: 84 mg (01/17/2019), 84 mg (01/24/2019), 84 mg (02/14/2019), 84 mg (02/21/2019), 84 mg (02/28/2019), 84 mg (01/31/2019), 84 mg (02/07/2019), 84 mg (03/07/2019) ? ? for chemotherapy treatment.  ? ?  ?04/10/2019 - 05/20/2020 Chemotherapy  ? The patient had durvalumab (IMFINZI) 620 mg in sodium chloride 0.9 % 100 mL chemo infusion, 9.6 mg/kg = 640 mg, Intravenous,  Once, 26 of 26 cycles ?Dose modification: 600 mg (original dose 10 mg/kg, Cycle 10, Reason: Other (see comments), Comment: change dose due to no plasma delivery) ?Administration: 620 mg (04/24/2019), 620 mg (10/27/2019), 620 mg (10/13/2019), 620 mg (08/04/2019), 620 mg (09/15/2019), 620 mg (08/18/2019), 600 mg (09/01/2019), 620 mg (11/10/2019), 620 mg (09/29/2019), 620 mg (04/10/2019), 620 mg (05/08/2019), 620 mg (05/22/2019), 620 mg (06/12/2019), 620 mg (06/26/2019), 620 mg (11/24/2019), 620 mg (12/24/2019), 620 mg (12/10/2019), 620 mg (01/07/2020), 620 mg (01/21/2020), 620 mg (02/18/2020), 620 mg (03/03/2020), 620 mg (03/17/2020), 620 mg (04/22/2020), 620 mg (05/06/2020), 620 mg (05/20/2020) ? ? for chemotherapy treatment.  ? ?  ?07/08/2020  Cancer Staging  ? Staging form: Lung, AJCC 8th Edition ?- Clinical stage from 07/08/2020: Stage IVA (rcT4, cM1a) - Signed by YEarlie Server MD on 07/08/2020 ? ?  ?07/16/2020 - 07/07/2021 Chemotherapy  ? Patient is on Treatment Plan : LUNG NSCLC Carboplatin + Paclitaxel + Pembrolizumab q21d x 6 cycles  ?   ?08/03/2021 -  Chemotherapy  ? Patient is on Treatment Plan : LUNG Docetaxel + Ramucirumab q21d   ?   ? ? ?ALLERGIES:  has No Known Allergies. ? ?MEDICATIONS:  ?Current Outpatient Medications  ?Medication Sig Dispense Refill  ? albuterol (VENTOLIN HFA) 108 (90 Base) MCG/ACT inhaler INHALE 2 PUFFS BY MOUTH EVERY 6 HOURS AS NEEDED FOR WHEEZE OR SHORTNESS OF BREATH 8.5 g 5  ? apixaban (ELIQUIS) 2.5 MG TABS tablet Take 1 tablet (2.5 mg total) by mouth 2 (two) times daily. 60 tablet 3  ? budesonide-formoterol (SYMBICORT) 80-4.5 MCG/ACT inhaler Inhale 2 puffs into the lungs 2 (two) times daily. in the morning and at bedtime. 10.2 each 6  ? dexamethasone (DECADRON) 4 MG tablet Take 2 tablets (8 mg total) by mouth 2 (two) times daily. Start the day before Taxotere. Then daily after chemo for 2 days. 30 tablet 1  ? diltiazem (CARDIZEM CD) 120 MG 24 hr capsule Take 120 mg by mouth daily.    ? dronabinol (MARINOL) 5 MG capsule Take 1 capsule (5 mg total) by mouth 2 (two) times daily before  a meal. 60 capsule 0  ? fidaxomicin (DIFICID) 200 MG TABS tablet Take 1 tablet (200 mg total) by mouth 2 (two) times daily. 20 tablet 0  ? folic acid (V-R FOLIC ACID) 818 MCG tablet Take 1 tablet (400 mcg total) by mouth daily. 90 tablet 1  ? HYDROcodone-acetaminophen (NORCO/VICODIN) 5-325 MG tablet Take 1 tablet by mouth every 8 (eight) hours as needed for severe pain. 30 tablet 0  ? latanoprost (XALATAN) 0.005 % ophthalmic solution SMARTSIG:In Eye(s)    ? loperamide (IMODIUM) 2 MG capsule Take 1 capsule (2 mg total) by mouth See admin instructions. Initial: 4 mg, followed by 2 mg after each loose stool; maximum: 16 mg/day 60 capsule 0  ?  loratadine (CLARITIN) 10 MG tablet Take 10 mg by mouth daily.    ? methocarbamol (ROBAXIN) 500 MG tablet Take 1 tablet (500 mg total) by mouth at bedtime. 30 tablet 0  ? metoprolol succinate (TOPROL-XL) 25 MG 24 hr tablet T

## 2021-10-12 NOTE — Progress Notes (Signed)
Positive for C Diff. Diarrhea has completely stopped. Eating and drinking well. States he feels better. ?

## 2021-10-14 ENCOUNTER — Ambulatory Visit: Payer: 59

## 2021-10-17 NOTE — Progress Notes (Signed)
? ?10/18/21 ?12:27 PM  ? ?Samuel Choi ?[birth year] ?[id number] ? ?Referring provider:  ?Tracie Harrier, MD ?[place] ?[facility] ?[place],  [place] [address] ?Chief Complaint  ?Patient presents with  ? Elevated PSA  ? ? ? ?HPI: ?Samuel Choi is a 56 y.o.male with a personal history of elevated PSA and DVT, who presents today for a 6 month follow-up with PSA.  ? 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  He also has a personal history of DVT status post thrombectomy and is currently on Eliquis. ? ?04/08/2021 MRI of prostate with/without contrast revealed PIRADS category 3 lesion in the right base peripheral zone.  ? ?His most recent PSA was 5.1 on 10/11/2021.  ? ?Since last visit he has had further progression of his lung cancer on [place] he is s/p carboplatinum, taxol and [place] scheduled for another treatment tomorrow. ? ?He continues to have no voiding complaints. ? ? ?PSA trend:  ? Prostate Specific Ag, Serum  ?Latest Ref Rng 0.0 - 4.0 ng/mL  ?08/31/2017 1.72  ?03/20/2019 3.14  ?02/17/2021 6.03 (H)  ?03/08/2021 8.2 (H)   ?10/11/2021 5.1 (H)   ?  ?(H) High ? ?PMH: ?Past Medical History:  ?Diagnosis Date  ? Alcohol abuse   ? usually drinks 2-3 drinks per day  ? Atherosclerosis 06/2018  ? Chronic sinusitis   ? Dehydration 02/07/2019  ? Emphysema of lung ([place]) 06/2018  ? patient unaware of this.  ? Hip fracture ([place]) 05/2018  ? no surgery  ? History of kidney stones 05/2018  ? per xray, bilateral nephrolitiasis  ? Hypertension   ? Squamous cell carcinoma of lung, right ([place]) 06/2018  ? ? ?Surgical History: ?Past Surgical History:  ?Procedure Laterality Date  ? BRAIN SURGERY  10/2017  ? nasal/sinus endoscopy. mass benign  ? ELECTROMAGNETIC NAVIGATION BROCHOSCOPY Right 06/25/2018  ? Procedure: ELECTROMAGNETIC NAVIGATION BRONCHOSCOPY;  Surgeon: [name], MD;  Location: [facility] OR[facility];  Service: Cardiopulmonary;  Laterality: Right;  ? ENDOBRONCHIAL ULTRASOUND  Right 06/25/2018  ? Procedure: ENDOBRONCHIAL ULTRASOUND;  Surgeon: [name], MD;  Location: [facility];  Service: Cardiopulmonary;  Laterality: Right;  ? ENDOBRONCHIAL ULTRASOUND Right 12/26/2018  ? Procedure: ENDOBRONCHIAL ULTRASOUND RIGHT;  Surgeon: [name], MD;  Location: [facility];  Service: Cardiopulmonary;  Laterality: Right;  ? FLEXIBLE BRONCHOSCOPY N/A 08/20/2018  ? Procedure: FLEXIBLE BRONCHOSCOPY PREOP;  Surgeon: [name], MD;  Location: [facility];  Service: Thoracic;  Laterality: N/A;  ? IR CV LINE INJECTION  04/04/2019  ? NASAL SINUS SURGERY  10/2017  ? At [facility], frontal sinusotomy, ethmoidectomy, resection anterior cranial fossa neoplasm, turbinate resection  ? PERIPHERAL VASCULAR THROMBECTOMY Left 12/31/2020  ? Procedure: PERIPHERAL VASCULAR THROMBECTOMY;  Surgeon: Algernon Huxley, MD;  Location: [place] CV [facility];  Service: Cardiovascular;  Laterality: Left;  ? PORTACATH PLACEMENT Left 01/15/2019  ? Procedure: INSERTION PORT-A-CATH;  Surgeon: [name], MD;  Location: [facility];  Service: General;  Laterality: Left;  ? THORACOTOMY Right 08/13/2018  ? Procedure: PREOP BROCHOSCOPY WITH RIGHT THORACOTOMY AND RUL RESECTION;  Surgeon: [name], MD;  Location: [facility];  Service: General;  Laterality: Right;  ? THORACOTOMY Right 08/20/2018  ? Procedure: THORACOTOMY MAJOR RIGHT UPPER LOBE LOBECTOMY;  Surgeon: [name], MD;  Location: [facility];  Service: Thoracic;  Laterality: Right;  ? TOE SURGERY Left   ? pin in left toe  ? ? ?Home Medications:  ?Allergies as of 10/18/2021   ?No Known Allergies ?  ? ?  ?  Medication List  ?  ? ?  ? Accurate as of October 18, 2021 12:27 PM. If you have any questions, ask your nurse or doctor.  ?  ?  ? ?  ? ?albuterol 108 (90 Base) MCG/ACT inhaler ?Commonly known as: VENTOLIN HFA ?INHALE 2 PUFFS BY MOUTH EVERY 6 HOURS AS NEEDED FOR WHEEZE OR SHORTNESS OF BREATH ?  ?apixaban 2.5 MG Tabs tablet ?Commonly known as: Eliquis ?Take 1 tablet (2.5 mg total) by mouth 2 (two)  times daily. ?  ?budesonide-formoterol 80-4.5 MCG/ACT inhaler ?Commonly known as: Symbicort ?Inhale 2 puffs into the lungs 2 (two) times daily. in the morning and at bedtime. ?  ?dexamethasone 4 MG tablet ?Commonly known as: DECADRON ?Take 2 tablets (8 mg total) by mouth 2 (two) times daily. Start the day before Taxotere. Then daily after chemo for 2 days. ?  ?Dificid 200 MG Tabs tablet ?Generic drug: fidaxomicin ?Take 1 tablet (200 mg total) by mouth 2 (two) times daily. ?  ?diltiazem 120 MG 24 hr capsule ?Commonly known as: CARDIZEM CD ?Take 120 mg by mouth daily. ?  ?dronabinol 5 MG capsule ?Commonly known as: Marinol ?Take 1 capsule (5 mg total) by mouth 2 (two) times daily before a meal. ?  ?folic acid 811 MCG tablet ?Commonly known as: V-R FOLIC ACID ?Take 1 tablet (400 mcg total) by mouth daily. ?  ?hydrALAZINE 100 MG tablet ?Commonly known as: APRESOLINE ?Take 50 mg by mouth 2 (two) times daily. ?  ?HYDROcodone-acetaminophen 5-325 MG tablet ?Commonly known as: NORCO/VICODIN ?Take 1 tablet by mouth every 8 (eight) hours as needed for severe pain. ?  ?latanoprost 0.005 % ophthalmic solution ?Commonly known as: XALATAN ?SMARTSIG:In Eye(s) ?  ?loperamide 2 MG capsule ?Commonly known as: IMODIUM ?Take 1 capsule (2 mg total) by mouth See admin instructions. Initial: 4 mg, followed by 2 mg after each loose stool; maximum: 16 mg/day ?  ?loratadine 10 MG tablet ?Commonly known as: CLARITIN ?Take 10 mg by mouth daily. ?  ?methocarbamol 500 MG tablet ?Commonly known as: ROBAXIN ?Take 1 tablet (500 mg total) by mouth at bedtime. ?  ?metoprolol succinate 25 MG 24 hr tablet ?Commonly known as: TOPROL-XL ?Take 1 tablet (25 mg total) by mouth daily. ?  ?nystatin 100000 UNIT/ML suspension ?Commonly known as: MYCOSTATIN ?Take 5 mLs (500,000 Units total) by mouth 4 (four) times daily. Swish and swallow. ?  ?olmesartan 40 MG tablet ?Commonly known as: BENICAR ?Take 40 mg by mouth every other day. ?  ?ondansetron 8 MG  tablet ?Commonly known as: Zofran ?Take 1 tablet (8 mg total) by mouth every 8 (eight) hours as needed for refractory nausea / vomiting. Start on day 3 after chemo. ?  ?pantoprazole 20 MG tablet ?Commonly known as: Protonix ?Take 1 tablet (20 mg total) by mouth daily. ?  ?promethazine 25 MG tablet ?Commonly known as: PHENERGAN ?Take 1 tablet (25 mg total) by mouth every 6 (six) hours as needed for nausea or vomiting. ?  ?Spiriva HandiHaler 18 MCG inhalation capsule ?Generic drug: tiotropium ?INHALE 1 CAPSULE VIA HANDIHALER ONCE DAILY AT THE SAME TIME EVERY DAY ?  ?vitamin B-12 1000 MCG tablet ?Commonly known as: CYANOCOBALAMIN ?Take 1 tablet (1,000 mcg total) by mouth daily. ?  ? ?  ? ? ?Allergies:  ?No Known Allergies ? ?Family History: ?Family History  ?Problem Relation Age of Onset  ? Breast cancer Mother   ? Diabetes Mother   ? Lung cancer Father   ? Hypertension Father   ? ? ?  Social History:  reports that he quit smoking about 3 years ago. His smoking use included cigarettes. He has a 7.50 pack-year smoking history. He has never used smokeless tobacco. He reports current alcohol use of about 3.0 standard drinks per week. He reports that he does not use drugs. ? ? ?Physical Exam: ?BP 111/73   Pulse (!) 116   Ht _0  (1.854 m)   Wt 132 lb (59.9 kg)   BMI 17.42 kg/m?   ?Constitutional:  Alert and oriented, No acute distress. ?HEENT: [place] AT, moist mucus membranes.  Trachea midline, no masses. ?Cardiovascular: No clubbing, cyanosis, or edema. ?Respiratory: Normal respiratory effort, no increased work of breathing. ?Skin: No rashes, bruises or suspicious lesions. ?Neurologic: Grossly intact, no focal deficits, moving all 4 extremities. ?Psychiatric: Normal mood and affect. ? ?Laboratory Data: ? ?Lab Results  ?Component Value Date  ? CREATININE 0.83 10/12/2021  ? ?Lab Results  ?Component Value Date  ? HGBA1C 4.4 07/04/2014  ? ? ?Assessment & Plan:   ? ?Elevated PSA  ?- In light of other medical comorbidity such as  progressive lung cancer now stage IV and trending downward in PSA, we will plan for a more conservative approach will have him return in 1 year for PSA. He is agreeable with this  ?-MRI of the prostate confirms no evide

## 2021-10-18 ENCOUNTER — Encounter: Payer: Self-pay | Admitting: Urology

## 2021-10-18 ENCOUNTER — Ambulatory Visit (INDEPENDENT_AMBULATORY_CARE_PROVIDER_SITE_OTHER): Payer: 59 | Admitting: Urology

## 2021-10-18 ENCOUNTER — Encounter: Payer: Self-pay | Admitting: Oncology

## 2021-10-18 VITALS — BP 111/73 | HR 116 | Ht 73.0 in | Wt 132.0 lb

## 2021-10-18 DIAGNOSIS — R972 Elevated prostate specific antigen [PSA]: Secondary | ICD-10-CM | POA: Diagnosis not present

## 2021-10-19 ENCOUNTER — Inpatient Hospital Stay: Payer: 59

## 2021-10-19 ENCOUNTER — Encounter: Payer: Self-pay | Admitting: Oncology

## 2021-10-19 ENCOUNTER — Inpatient Hospital Stay (HOSPITAL_BASED_OUTPATIENT_CLINIC_OR_DEPARTMENT_OTHER): Payer: 59 | Admitting: Oncology

## 2021-10-19 VITALS — BP 130/73 | HR 106 | Temp 96.6°F | Ht 73.0 in | Wt 128.0 lb

## 2021-10-19 DIAGNOSIS — C3491 Malignant neoplasm of unspecified part of right bronchus or lung: Secondary | ICD-10-CM

## 2021-10-19 DIAGNOSIS — D6481 Anemia due to antineoplastic chemotherapy: Secondary | ICD-10-CM | POA: Diagnosis not present

## 2021-10-19 DIAGNOSIS — Z5111 Encounter for antineoplastic chemotherapy: Secondary | ICD-10-CM | POA: Diagnosis not present

## 2021-10-19 DIAGNOSIS — Z95828 Presence of other vascular implants and grafts: Secondary | ICD-10-CM | POA: Diagnosis not present

## 2021-10-19 DIAGNOSIS — Z86718 Personal history of other venous thrombosis and embolism: Secondary | ICD-10-CM

## 2021-10-19 DIAGNOSIS — A0472 Enterocolitis due to Clostridium difficile, not specified as recurrent: Secondary | ICD-10-CM

## 2021-10-19 DIAGNOSIS — T451X5A Adverse effect of antineoplastic and immunosuppressive drugs, initial encounter: Secondary | ICD-10-CM

## 2021-10-19 DIAGNOSIS — Z5112 Encounter for antineoplastic immunotherapy: Secondary | ICD-10-CM | POA: Diagnosis not present

## 2021-10-19 LAB — CBC WITH DIFFERENTIAL/PLATELET
Abs Immature Granulocytes: 0.09 10*3/uL — ABNORMAL HIGH (ref 0.00–0.07)
Basophils Absolute: 0 10*3/uL (ref 0.0–0.1)
Basophils Relative: 0 %
Eosinophils Absolute: 0 10*3/uL (ref 0.0–0.5)
Eosinophils Relative: 0 %
HCT: 29.3 % — ABNORMAL LOW (ref 39.0–52.0)
Hemoglobin: 9.9 g/dL — ABNORMAL LOW (ref 13.0–17.0)
Immature Granulocytes: 1 %
Lymphocytes Relative: 8 %
Lymphs Abs: 0.9 10*3/uL (ref 0.7–4.0)
MCH: 36.5 pg — ABNORMAL HIGH (ref 26.0–34.0)
MCHC: 33.8 g/dL (ref 30.0–36.0)
MCV: 108.1 fL — ABNORMAL HIGH (ref 80.0–100.0)
Monocytes Absolute: 0.5 10*3/uL (ref 0.1–1.0)
Monocytes Relative: 5 %
Neutro Abs: 9.7 10*3/uL — ABNORMAL HIGH (ref 1.7–7.7)
Neutrophils Relative %: 86 %
Platelets: 173 10*3/uL (ref 150–400)
RBC: 2.71 MIL/uL — ABNORMAL LOW (ref 4.22–5.81)
RDW: 17.2 % — ABNORMAL HIGH (ref 11.5–15.5)
WBC: 11.2 10*3/uL — ABNORMAL HIGH (ref 4.0–10.5)
nRBC: 0.4 % — ABNORMAL HIGH (ref 0.0–0.2)

## 2021-10-19 LAB — COMPREHENSIVE METABOLIC PANEL
ALT: 8 U/L (ref 0–44)
AST: 21 U/L (ref 15–41)
Albumin: 3.2 g/dL — ABNORMAL LOW (ref 3.5–5.0)
Alkaline Phosphatase: 94 U/L (ref 38–126)
Anion gap: 15 (ref 5–15)
BUN: 13 mg/dL (ref 6–20)
CO2: 20 mmol/L — ABNORMAL LOW (ref 22–32)
Calcium: 8.6 mg/dL — ABNORMAL LOW (ref 8.9–10.3)
Chloride: 100 mmol/L (ref 98–111)
Creatinine, Ser: 0.97 mg/dL (ref 0.61–1.24)
GFR, Estimated: >60 mL/min (ref >60)
Glucose, Bld: 129 mg/dL — ABNORMAL HIGH (ref 70–99)
Potassium: 3.9 mmol/L (ref 3.5–5.1)
Sodium: 135 mmol/L (ref 135–145)
Total Bilirubin: 0.3 mg/dL (ref 0.3–1.2)
Total Protein: 7 g/dL (ref 6.5–8.1)

## 2021-10-19 MED ORDER — APIXABAN 2.5 MG PO TABS
2.5000 mg | ORAL_TABLET | Freq: Two times a day (BID) | ORAL | 6 refills | Status: DC
Start: 2021-10-19 — End: 2022-02-04

## 2021-10-19 MED ORDER — HEPARIN SOD (PORK) LOCK FLUSH 100 UNIT/ML IV SOLN
500.0000 [IU] | Freq: Once | INTRAVENOUS | Status: AC
  Administered 2021-10-19: 500 [IU]
  Filled 2021-10-19: qty 5

## 2021-10-19 MED ORDER — SODIUM CHLORIDE 0.9 % IV SOLN
Freq: Once | INTRAVENOUS | Status: AC
  Filled 2021-10-19: qty 250

## 2021-10-19 MED ORDER — BUDESONIDE-FORMOTEROL FUMARATE 80-4.5 MCG/ACT IN AERO: 2.0000 | INHALATION_SPRAY | Freq: Two times a day (BID) | RESPIRATORY_TRACT | 12 refills | Status: DC

## 2021-10-19 MED ORDER — DEXAMETHASONE 4 MG PO TABS: 8.0000 mg | ORAL_TABLET | Freq: Two times a day (BID) | ORAL | 1 refills | Status: DC

## 2021-10-19 NOTE — Progress Notes (Signed)
?Hematology/Oncology Progress note ?[phone] [phone] ?  ? ? ? ?Patient Care Team: ?Tracie Harrier, MD as PCP - General (Internal Medicine) ?Earlie Server, MD as Consulting Physician (Hematology and Oncology) ? ?REASON FOR VISIT:  ?Follow up for treatment of squamous lung cancer.  ? ?HISTORY OF PRESENTING ILLNESS:  ?# Dec 2019 Stage I squamous lung cancer ?#s/p Bronchoscopy on 06/25/2018. subcarina EBUS FNA was non diagnostic, hypocellular specimen.  ?# 08/13/2018 CT guided right upper lobe biopsy pathology showed dense fibrosis and mixed inflammatory cells with prominent polytypic plasma cells component. Focal benign bronchial wall and alveolar spaces. No malignancy was identified.  ? ?# 08/13/2018 underwent right thoracotomy and wedge resection of a right upper lobe mass.  Frozen section was consistent with an inflammatory process.  On the second postop day, preliminary pathology reports high-grade malignancy.  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  08/20/2018 Patient was therefore taken back to the OR and underwent take complete lobectomy  pT1b pN0 cM0 stage I squamous cell lung cancer.  ?Margin is negative.  Recommend observation.   ? ?# 12/03/2018 CT chest w contrast showed local recurrence.  ?12/10/2018 PET showed No evidence of distant metastatic disease ?# 12/26/2018 s/p bronchoscopy biopsy. Confirmed local recurrence of squamous cell lung cancer.  ?medi port placed by Dr.Oaks. ? ?# 06/14/2020 CT chest w contrast was reviewed and discussed with patient.  ?Evidence of progressive lymphangitic spread of tumor throughout the right lung, with contralateral lung nodules with  right pleural effusion  ?MRI brain is negative for CNS involvement.  CT abdomen with contrast showed no evidence of abdominal metastatic disease ?PET scan was not approved by insurance-peer to peer appeal with Dr.Vipul Bhanderi  ? ?# NGS - KRAS V14L,  TMB 26.6-high, MS stable, PD-L1 5% ?CDKN2C G48,  DICER1c.2117-1G>T, FLT4 G1131S, NF1 A461f, NF1 LR4270, STK11 N1866f?Cancer treatment ?Started concurrent chemoradiation on 01/16/2019 ?Carboplatin AUC of 2 and Taxol 45 mg/m2 weekly finished in 03/05/2019 ?04/10/2019, patient started on durvalumab maintenance. ?-CT showed progression.  Insurance denied PET scan evaluation. ?07/16/2020-09/28/2020 4 cycles of carboplatin/paclitaxel/Keytruda ? ?10/15/2020, CT chest abdomen pelvis redemonstrated postoperative and postradiation appearance of the right chest with perihilar consolidation and fibrosis.  Slight interval decrease in size of the pulmonary nodules associated with interlobular septal thickening nearly complete resolution of previously seen left-sided pulmonary nodules.  Minimal irregular residual.  Right pleural effusion improved.  Consistent with treatment response.  No evidence of metastatic disease with the abdomen or pelvis.  None obstructive bilateral nephrolithiasis ?- partial response to the treatment- ?10/19/2020, Keytruda monotherapy maintenance ? ?12/15/2020, CT chest without contrast showed resolution of lung nodules, no evidence of recurrent disease ?12/30/2020, patient was admitted due to unprovoked extensive deep vein thrombosis from calf vein to the common femoral vein on the left.  Patient was started on heparin drip.  Patient underwent thrombolysis and is a colectomy on 12/31/2020.  Anticoagulation was switched to Eliquis at discharge. ? ?04/22/2021, CT chest with contrast showed none occlusive pulmonary embolism, ?Interval progression of consolidation airspace opacity in the medial right middle lobe with bandlike opacity and bronchiectasis in the perihilar right lung,-this is compatible with evolving posttreatment changes although recurrent disease anteriorly is not excluded. ?Interval development of numerous bilateral pulmonary nodules, measuring up to 11 mm in the right lung base.  Concerning for metastatic disease.  Tiny right pleural effusion.   ? ?04/26/2021, resumed on carboplatin/Taxol/Keytruda. ?Patient did not tolerate his chemotherapy of carboplatin/Taxol/Keytruda on 04/26/2021. ?Patient has developed severe anemia status post  PRBC transfusion. ?05/25/2021, carboplatin AUC 5 with Keytruda ?06/23/2021  continued on carboplatin AUC 5 with Keytruda ?07/07/2021 carboplatin with [name]  ? ?07/15/2021 CT chest with contrast showed enlarged right lower lobe thick-walled cavitary lesion in the anterior aspect of the right lower lobe.  Concerning for progression.  Multiple other small pulmonary nodules are also noted elsewhere in the lungs, many of which are stable compared to prior studies.  Several are new or clearly enlarged.  The best example is an enlarging pulmonary nodule in the left upper lobe, 8 x 6 mm comparing to 4 x 3 mm on 04/22/2021. ? ?#January 2023 patient was hospitalized due to hyperkalemia. Patient was given calcium gluconate, nebulized albuterol and IV Lasix.  He was observed overnight. potassium improved and patient was discharged.  He has stopped taking potassium supplementation. ? ?INTERVAL HISTORY ?Samuel Choi is a 56 y.o. male who has above history reviewed by me today presents for reatment of  recurrent  squamous cell carcinoma. ? ?Diarrhea, improved.  Patient is on Dificid, he has 2 more days of antibiotics course. ?Denies any nausea vomiting diarrhea. ?Chronic shortness of breath, unchanged. ?  Review of Systems  ?Constitutional:  Positive for fatigue. Negative for appetite change, chills and fever.  ?HENT:   Negative for hearing loss and voice change.   ?Eyes:  Negative for eye problems and icterus.  ?Respiratory:  Positive for shortness of breath. Negative for chest tightness and cough.   ?Cardiovascular:  Negative for chest pain and leg swelling.  ?Gastrointestinal:  Negative for abdominal distention, abdominal pain, nausea and vomiting.  ?Endocrine: Negative for hot flashes.  ?Genitourinary:  Negative for difficulty  urinating, dysuria and frequency.   ?Musculoskeletal:  Negative for arthralgias.  ?Skin:  Negative for itching and rash.  ?Neurological:  Negative for light-headedness and numbness.  ?Hematological:  Negative for adenopathy. Does not bruise/bleed easily.  ?Psychiatric/Behavioral:  Negative for confusion.   ? ?MEDICAL HISTORY:  ?Past Medical History:  ?Diagnosis Date  ? Alcohol abuse   ? usually drinks 2-3 drinks per day  ? Atherosclerosis 06/2018  ? Chronic sinusitis   ? Dehydration 02/07/2019  ? Emphysema of lung ([place]) 06/2018  ? patient unaware of this.  ? Hip fracture ([place]) 05/2018  ? no surgery  ? History of kidney stones 05/2018  ? per xray, bilateral nephrolitiasis  ? Hypertension   ? Squamous cell carcinoma of lung, right ([place]) 06/2018  ? ? ?SURGICAL HISTORY: ?Past Surgical History:  ?Procedure Laterality Date  ? BRAIN SURGERY  10/2017  ? nasal/sinus endoscopy. mass benign  ? ELECTROMAGNETIC NAVIGATION BROCHOSCOPY Right 06/25/2018  ? Procedure: ELECTROMAGNETIC NAVIGATION BRONCHOSCOPY;  Surgeon: [name], MD;  Location: [facility];  Service: Cardiopulmonary;  Laterality: Right;  ? ENDOBRONCHIAL ULTRASOUND Right 06/25/2018  ? Procedure: ENDOBRONCHIAL ULTRASOUND;  Surgeon: [name], MD;  Location: [facility];  Service: Cardiopulmonary;  Laterality: Right;  ? ENDOBRONCHIAL ULTRASOUND Right 12/26/2018  ? Procedure: ENDOBRONCHIAL ULTRASOUND RIGHT;  Surgeon: [name], MD;  Location: [facility];  Service: Cardiopulmonary;  Laterality: Right;  ? FLEXIBLE BRONCHOSCOPY N/A 08/20/2018  ? Procedure: FLEXIBLE BRONCHOSCOPY PREOP;  Surgeon: [name], MD;  Location: [facility];  Service: Thoracic;  Laterality: N/A;  ? IR CV LINE INJECTION  04/04/2019  ? NASAL SINUS SURGERY  10/2017  ? At [facility], frontal sinusotomy, ethmoidectomy, resection anterior cranial fossa neoplasm, turbinate resection  ? PERIPHERAL VASCULAR THROMBECTOMY Left 12/31/2020  ? Procedure: PERIPHERAL VASCULAR THROMBECTOMY;  Surgeon: Algernon Huxley, MD;   Location: [facility]  INVASIVE CV LAB;  Service: Cardiovascular;  Laterality: Left;  ? PORTACATH PLACEMENT Left 01/15/2019  ? Procedure: INSERTION PORT-A-CATH;  Surgeon: [name], MD;  Location: [facility] OR[facility];  Service: General;  Laterality: [place]

## 2021-10-26 ENCOUNTER — Inpatient Hospital Stay: Payer: 59

## 2021-10-26 VITALS — BP 131/79 | HR 96 | Temp 97.0°F | Resp 18 | Ht 73.0 in | Wt 127.5 lb

## 2021-10-26 DIAGNOSIS — C3491 Malignant neoplasm of unspecified part of right bronchus or lung: Secondary | ICD-10-CM

## 2021-10-26 DIAGNOSIS — Z5112 Encounter for antineoplastic immunotherapy: Secondary | ICD-10-CM | POA: Diagnosis not present

## 2021-10-26 LAB — CBC WITH DIFFERENTIAL/PLATELET
Abs Immature Granulocytes: 0.05 10*3/uL (ref 0.00–0.07)
Basophils Absolute: 0 10*3/uL (ref 0.0–0.1)
Basophils Relative: 0 %
Eosinophils Absolute: 0 10*3/uL (ref 0.0–0.5)
Eosinophils Relative: 0 %
HCT: 32.2 % — ABNORMAL LOW (ref 39.0–52.0)
Hemoglobin: 10.8 g/dL — ABNORMAL LOW (ref 13.0–17.0)
Immature Granulocytes: 1 %
Lymphocytes Relative: 8 %
Lymphs Abs: 0.6 10*3/uL — ABNORMAL LOW (ref 0.7–4.0)
MCH: 36.6 pg — ABNORMAL HIGH (ref 26.0–34.0)
MCHC: 33.5 g/dL (ref 30.0–36.0)
MCV: 109.2 fL — ABNORMAL HIGH (ref 80.0–100.0)
Monocytes Absolute: 0.1 10*3/uL (ref 0.1–1.0)
Monocytes Relative: 2 %
Neutro Abs: 6.5 10*3/uL (ref 1.7–7.7)
Neutrophils Relative %: 89 %
Platelets: 202 10*3/uL (ref 150–400)
RBC: 2.95 MIL/uL — ABNORMAL LOW (ref 4.22–5.81)
RDW: 17.7 % — ABNORMAL HIGH (ref 11.5–15.5)
WBC: 7.3 10*3/uL (ref 4.0–10.5)
nRBC: 0 % (ref 0.0–0.2)

## 2021-10-26 LAB — COMPREHENSIVE METABOLIC PANEL
ALT: 7 U/L (ref 0–44)
AST: 15 U/L (ref 15–41)
Albumin: 3.3 g/dL — ABNORMAL LOW (ref 3.5–5.0)
Alkaline Phosphatase: 104 U/L (ref 38–126)
Anion gap: 12 (ref 5–15)
BUN: 11 mg/dL (ref 6–20)
CO2: 22 mmol/L (ref 22–32)
Calcium: 8.8 mg/dL — ABNORMAL LOW (ref 8.9–10.3)
Chloride: 103 mmol/L (ref 98–111)
Creatinine, Ser: 0.87 mg/dL (ref 0.61–1.24)
GFR, Estimated: >60 mL/min (ref >60)
Glucose, Bld: 132 mg/dL — ABNORMAL HIGH (ref 70–99)
Potassium: 4.4 mmol/L (ref 3.5–5.1)
Sodium: 137 mmol/L (ref 135–145)
Total Bilirubin: 0.3 mg/dL (ref 0.3–1.2)
Total Protein: 7.3 g/dL (ref 6.5–8.1)

## 2021-10-26 MED ORDER — SODIUM CHLORIDE 0.9 % IV SOLN
10.0000 mg/kg | Freq: Once | INTRAVENOUS | Status: AC
  Administered 2021-10-26: 600 mg via INTRAVENOUS
  Filled 2021-10-26: qty 50

## 2021-10-26 MED ORDER — DIPHENHYDRAMINE HCL 50 MG/ML IJ SOLN
50.0000 mg | Freq: Once | INTRAMUSCULAR | Status: AC
  Administered 2021-10-26: 50 mg via INTRAVENOUS
  Filled 2021-10-26: qty 1

## 2021-10-26 MED ORDER — ACETAMINOPHEN 325 MG PO TABS
650.0000 mg | ORAL_TABLET | Freq: Once | ORAL | Status: AC
  Administered 2021-10-26: 650 mg via ORAL
  Filled 2021-10-26: qty 2

## 2021-10-26 MED ORDER — HEPARIN SOD (PORK) LOCK FLUSH 100 UNIT/ML IV SOLN
500.0000 [IU] | Freq: Once | INTRAVENOUS | Status: AC | PRN
  Administered 2021-10-26: 500 [IU]
  Filled 2021-10-26: qty 5

## 2021-10-26 MED ORDER — SODIUM CHLORIDE 0.9 % IV SOLN
Freq: Once | INTRAVENOUS | Status: AC
  Filled 2021-10-26: qty 250

## 2021-10-26 MED ORDER — SODIUM CHLORIDE 0.9 % IV SOLN
10.0000 mg | Freq: Once | INTRAVENOUS | Status: AC
  Administered 2021-10-26: 10 mg via INTRAVENOUS
  Filled 2021-10-26 (×2): qty 1
  Filled 2021-10-26: qty 10
  Filled 2021-10-26: qty 1

## 2021-10-26 MED ORDER — SODIUM CHLORIDE 0.9 % IV SOLN
75.0000 mg/m2 | Freq: Once | INTRAVENOUS | Status: AC
  Administered 2021-10-26: 130 mg via INTRAVENOUS
  Filled 2021-10-26: qty 13

## 2021-10-26 NOTE — Patient Instructions (Signed)
[facility] CANCER CTR AT [place]  Discharge Instructions: ?Thank you for choosing [facility] to provide your oncology and hematology care.  ?If you have a lab appointment with the [facility], please go directly to the [facility] and check in at the registration area. ? ?Wear comfortable clothing and clothing appropriate for easy access to any Portacath or PICC line.  ? ?We strive to give you quality time with your provider. You may need to reschedule your appointment if you arrive late (15 or more minutes).  Arriving late affects you and other patients whose appointments are after yours.  Also, if you miss three or more appointments without notifying the office, you may be dismissed from the clinic at the provider?s discretion.    ?  ?For prescription refill requests, have your pharmacy contact our office and allow 72 hours for refills to be completed.   ? ?Today you received the following chemotherapy and/or immunotherapy agents CYRAMZA AND TAXOTERE  ?  ?To help prevent nausea and vomiting after your treatment, we encourage you to take your nausea medication as directed. ? ?BELOW ARE SYMPTOMS THAT SHOULD BE REPORTED IMMEDIATELY: ?*FEVER GREATER THAN 100.4 F (38 ?C) OR HIGHER ?*CHILLS OR SWEATING ?*NAUSEA AND VOMITING THAT IS NOT CONTROLLED WITH YOUR NAUSEA MEDICATION ?*UNUSUAL SHORTNESS OF BREATH ?*UNUSUAL BRUISING OR BLEEDING ?*URINARY PROBLEMS (pain or burning when urinating, or frequent urination) ?*BOWEL PROBLEMS (unusual diarrhea, constipation, pain near the anus) ?TENDERNESS IN MOUTH AND THROAT WITH OR WITHOUT PRESENCE OF ULCERS (sore throat, sores in mouth, or a toothache) ?UNUSUAL RASH, SWELLING OR PAIN  ?UNUSUAL VAGINAL DISCHARGE OR ITCHING  ? ?Items with * indicate a potential emergency and should be followed up as soon as possible or go to the Emergency Department if any problems should occur. ? ?Please show the CHEMOTHERAPY ALERT CARD or IMMUNOTHERAPY ALERT CARD at  check-in to the Emergency Department and triage nurse. ? ?Should you have questions after your visit or need to cancel or reschedule your appointment, please contact [facility] CANCER [place] AT [place]  [phone] and follow the prompts.  Office hours are 8:00 a.m. to 4:30 p.m. Monday - Friday. Please note that voicemails left after 4:00 p.m. may not be returned until the following business day.  We are closed weekends and major holidays. You have access to a nurse at all times for urgent questions. Please call the main number to the clinic [phone] and follow the prompts. ? ?For any non-urgent questions, you may also contact your provider using MyChart. We now offer e-Visits for anyone 99 and older to request care online for non-urgent symptoms. For details visit [email or web address]. ?  ?Also download the MyChart app! Go to the app store, search "MyChart", open the app, select [facility], and log in with your MyChart username and password. ? ?Due to Covid, a mask is required upon entering the hospital/clinic. If you do not have a mask, one will be given to you upon arrival. For doctor visits, patients may have 1 support person aged 26 or older with them. For treatment visits, patients cannot have anyone with them due to current Covid guidelines and our immunocompromised population.  ? ?Ramucirumab injection ?What is this medication? ?RAMUCIRUMAB (ra mue SIR ue mab) is a monoclonal antibody. It is used to treat stomach cancer, colorectal cancer, liver cancer, and lung cancer. ?This medicine may be used for other purposes; ask your health care provider or pharmacist if you have questions. ?COMMON BRAND NAME(S): Cyramza ?What should  I tell my care team before I take this medication? ?They need to know if you have any of these conditions: ?bleeding disorders ?blood clots ?heart disease, including heart failure, heart attack, or chest pain (angina) ?high blood pressure ?infection (especially a virus  infection such as chickenpox, cold sores, or herpes) ?protein in your urine ?recent or planning to have surgery ?stroke ?an unusual or allergic reaction to ramucirumab, other medicines, foods, dyes, or preservatives ?pregnant or trying to get pregnant ?breast-feeding ?How should I use this medication? ?This medicine is for infusion into a vein. It is given by a health care professional in a hospital or clinic setting. ?Talk to your pediatrician regarding the use of this medicine in children. Special care may be needed. ?Overdosage: If you think you have taken too much of this medicine contact a poison control center or emergency room at once. ?NOTE: This medicine is only for you. Do not share this medicine with others. ?What if I miss a dose? ?It is important not to miss your dose. Call your doctor or health care professional if you are unable to keep an appointment. ?What may interact with this medication? ?Interactions have not been studied. ?This list may not describe all possible interactions. Give your health care provider a list of all the medicines, herbs, non-prescription drugs, or dietary supplements you use. Also tell them if you smoke, drink alcohol, or use illegal drugs. Some items may interact with your medicine. ?What should I watch for while using this medication? ?Your condition will be monitored carefully while you are receiving this medicine. You will need to to check your blood pressure and have your blood and urine tested while you are taking this medicine. ?Your condition will be monitored carefully while you are receiving this medicine. ?This medicine may increase your risk to bruise or bleed. Call your doctor or health care professional if you notice any unusual bleeding. ?Before having surgery, talk to your health care provider to make sure it is ok. This drug can increase the risk of poor healing of your surgical site or wound. You will need to stop this drug for 28 days before surgery.  After surgery, wait at least 2 weeks before restarting this drug. Make sure the surgical site or wound is healed enough before restarting this drug. Talk to your health care provider if questions. ?Do not become pregnant while taking this medicine or for 3 months after stopping it. Women should inform their doctor if they wish to become pregnant or think they might be pregnant. There is a potential for serious side effects to an unborn child. Talk to your health care professional or pharmacist for more information. Do not breast-feed an infant while taking this medicine or for 2 months after stopping it. ?This medicine may interfere with the ability to have a child. Talk with your doctor or health care professional if you are concerned about your fertility. ?What side effects may I notice from receiving this medication? ?Side effects that you should report to your doctor or health care professional as soon as possible: ?allergic reactions like skin rash, itching or hives, breathing problems, swelling of the face, lips, or tongue ?signs of infection - fever or chills, cough, sore throat ?chest pain or chest tightness ?confusion ?dizziness ?feeling faint or lightheaded, falls ?severe abdominal pain ?severe nausea, vomiting ?signs and symptoms of bleeding such as bloody or black, tarry stools; red or dark-brown urine; spitting up blood or brown material that looks like coffee grounds;  red spots on the skin; unusual bruising or bleeding from the eye, gums, or nose ?signs and symptoms of a blood clot such as breathing problems; changes in vision; chest pain; severe, sudden headache; pain, swelling, warmth in the leg; trouble speaking; sudden numbness or weakness of the face, arm or leg ?symptoms of a stroke: change in mental awareness, inability to talk or move one side of the body ?trouble walking, dizziness, loss of balance or coordination ?Side effects that usually do not require medical attention (report to your  doctor or health care professional if they continue or are bothersome): ?cold, clammy skin ?constipation ?diarrhea ?headache ?nausea, vomiting ?stomach pain ?unusually slow heartbeat ?unusually weak or tired ?Thi

## 2021-10-27 ENCOUNTER — Other Ambulatory Visit: Payer: Self-pay | Admitting: *Deleted

## 2021-10-27 MED ORDER — ALBUTEROL SULFATE HFA 108 (90 BASE) MCG/ACT IN AERS
INHALATION_SPRAY | RESPIRATORY_TRACT | 5 refills | Status: DC
Start: 2021-10-27 — End: 2022-03-20

## 2021-10-27 MED ORDER — PANTOPRAZOLE SODIUM 20 MG PO TBEC: 20.0000 mg | DELAYED_RELEASE_TABLET | Freq: Every day | ORAL | 2 refills | Status: DC

## 2021-10-28 ENCOUNTER — Inpatient Hospital Stay: Payer: 59

## 2021-10-28 DIAGNOSIS — Z5112 Encounter for antineoplastic immunotherapy: Secondary | ICD-10-CM | POA: Diagnosis not present

## 2021-10-28 DIAGNOSIS — C3491 Malignant neoplasm of unspecified part of right bronchus or lung: Secondary | ICD-10-CM

## 2021-10-28 MED ORDER — PEGFILGRASTIM-BMEZ 6 MG/0.6ML ~~LOC~~ SOSY
6.0000 mg | PREFILLED_SYRINGE | Freq: Once | SUBCUTANEOUS | Status: AC
  Administered 2021-10-28: 6 mg via SUBCUTANEOUS
  Filled 2021-10-28: qty 0.6

## 2021-11-08 MED FILL — Dexamethasone Sodium Phosphate Inj 100 MG/10ML: INTRAMUSCULAR | Qty: 1 | Status: AC

## 2021-11-09 ENCOUNTER — Inpatient Hospital Stay (HOSPITAL_BASED_OUTPATIENT_CLINIC_OR_DEPARTMENT_OTHER): Payer: 59 | Admitting: Oncology

## 2021-11-09 ENCOUNTER — Inpatient Hospital Stay: Payer: 59

## 2021-11-09 ENCOUNTER — Inpatient Hospital Stay: Payer: 59 | Attending: Oncology

## 2021-11-09 ENCOUNTER — Encounter: Payer: Self-pay | Admitting: Oncology

## 2021-11-09 VITALS — BP 132/78 | HR 96 | Temp 96.8°F | Wt 124.0 lb

## 2021-11-09 DIAGNOSIS — J432 Centrilobular emphysema: Secondary | ICD-10-CM | POA: Insufficient documentation

## 2021-11-09 DIAGNOSIS — Z902 Acquired absence of lung [part of]: Secondary | ICD-10-CM | POA: Insufficient documentation

## 2021-11-09 DIAGNOSIS — J90 Pleural effusion, not elsewhere classified: Secondary | ICD-10-CM | POA: Insufficient documentation

## 2021-11-09 DIAGNOSIS — Z87891 Personal history of nicotine dependence: Secondary | ICD-10-CM | POA: Insufficient documentation

## 2021-11-09 DIAGNOSIS — Z86711 Personal history of pulmonary embolism: Secondary | ICD-10-CM | POA: Insufficient documentation

## 2021-11-09 DIAGNOSIS — Z7951 Long term (current) use of inhaled steroids: Secondary | ICD-10-CM | POA: Insufficient documentation

## 2021-11-09 DIAGNOSIS — C3491 Malignant neoplasm of unspecified part of right bronchus or lung: Secondary | ICD-10-CM

## 2021-11-09 DIAGNOSIS — D6481 Anemia due to antineoplastic chemotherapy: Secondary | ICD-10-CM | POA: Insufficient documentation

## 2021-11-09 DIAGNOSIS — R634 Abnormal weight loss: Secondary | ICD-10-CM | POA: Insufficient documentation

## 2021-11-09 DIAGNOSIS — E860 Dehydration: Secondary | ICD-10-CM | POA: Insufficient documentation

## 2021-11-09 DIAGNOSIS — K5730 Diverticulosis of large intestine without perforation or abscess without bleeding: Secondary | ICD-10-CM | POA: Insufficient documentation

## 2021-11-09 DIAGNOSIS — Z7901 Long term (current) use of anticoagulants: Secondary | ICD-10-CM | POA: Insufficient documentation

## 2021-11-09 DIAGNOSIS — T451X5A Adverse effect of antineoplastic and immunosuppressive drugs, initial encounter: Secondary | ICD-10-CM | POA: Insufficient documentation

## 2021-11-09 DIAGNOSIS — Z7952 Long term (current) use of systemic steroids: Secondary | ICD-10-CM | POA: Insufficient documentation

## 2021-11-09 DIAGNOSIS — Z95828 Presence of other vascular implants and grafts: Secondary | ICD-10-CM

## 2021-11-09 DIAGNOSIS — Z86718 Personal history of other venous thrombosis and embolism: Secondary | ICD-10-CM | POA: Insufficient documentation

## 2021-11-09 DIAGNOSIS — C3411 Malignant neoplasm of upper lobe, right bronchus or lung: Secondary | ICD-10-CM | POA: Insufficient documentation

## 2021-11-09 DIAGNOSIS — Z79899 Other long term (current) drug therapy: Secondary | ICD-10-CM | POA: Insufficient documentation

## 2021-11-09 DIAGNOSIS — R000 Tachycardia, unspecified: Secondary | ICD-10-CM | POA: Insufficient documentation

## 2021-11-09 DIAGNOSIS — Z5111 Encounter for antineoplastic chemotherapy: Secondary | ICD-10-CM

## 2021-11-09 LAB — CBC WITH DIFFERENTIAL/PLATELET
Abs Immature Granulocytes: 1.29 10*3/uL — ABNORMAL HIGH (ref 0.00–0.07)
Basophils Absolute: 0.2 10*3/uL — ABNORMAL HIGH (ref 0.0–0.1)
Basophils Relative: 0 %
Eosinophils Absolute: 0 10*3/uL (ref 0.0–0.5)
Eosinophils Relative: 0 %
HCT: 30.8 % — ABNORMAL LOW (ref 39.0–52.0)
Hemoglobin: 10.5 g/dL — ABNORMAL LOW (ref 13.0–17.0)
Immature Granulocytes: 3 %
Lymphocytes Relative: 3 %
Lymphs Abs: 1.2 10*3/uL (ref 0.7–4.0)
MCH: 35.7 pg — ABNORMAL HIGH (ref 26.0–34.0)
MCHC: 34.1 g/dL (ref 30.0–36.0)
MCV: 104.8 fL — ABNORMAL HIGH (ref 80.0–100.0)
Monocytes Absolute: 0.7 10*3/uL (ref 0.1–1.0)
Monocytes Relative: 1 %
Neutro Abs: 46.2 10*3/uL — ABNORMAL HIGH (ref 1.7–7.7)
Neutrophils Relative %: 93 %
Platelets: 198 10*3/uL (ref 150–400)
RBC: 2.94 MIL/uL — ABNORMAL LOW (ref 4.22–5.81)
RDW: 16.3 % — ABNORMAL HIGH (ref 11.5–15.5)
WBC: 49.6 10*3/uL — ABNORMAL HIGH (ref 4.0–10.5)
nRBC: 0 % (ref 0.0–0.2)

## 2021-11-09 LAB — COMPREHENSIVE METABOLIC PANEL
ALT: 6 U/L (ref 0–44)
AST: 25 U/L (ref 15–41)
Albumin: 3 g/dL — ABNORMAL LOW (ref 3.5–5.0)
Alkaline Phosphatase: 105 U/L (ref 38–126)
Anion gap: 13 (ref 5–15)
BUN: 17 mg/dL (ref 6–20)
CO2: 22 mmol/L (ref 22–32)
Calcium: 9 mg/dL (ref 8.9–10.3)
Chloride: 97 mmol/L — ABNORMAL LOW (ref 98–111)
Creatinine, Ser: 0.96 mg/dL (ref 0.61–1.24)
GFR, Estimated: >60 mL/min (ref >60)
Glucose, Bld: 141 mg/dL — ABNORMAL HIGH (ref 70–99)
Potassium: 4 mmol/L (ref 3.5–5.1)
Sodium: 132 mmol/L — ABNORMAL LOW (ref 135–145)
Total Bilirubin: 0.5 mg/dL (ref 0.3–1.2)
Total Protein: 7 g/dL (ref 6.5–8.1)

## 2021-11-09 LAB — PROTEIN, URINE, RANDOM: Total Protein, Urine: 28 mg/dL

## 2021-11-09 MED ORDER — SODIUM CHLORIDE 0.9 % IV SOLN
Freq: Once | INTRAVENOUS | Status: AC
  Filled 2021-11-09: qty 250

## 2021-11-09 MED ORDER — HEPARIN SOD (PORK) LOCK FLUSH 100 UNIT/ML IV SOLN
500.0000 [IU] | Freq: Once | INTRAVENOUS | Status: AC
  Administered 2021-11-09: 500 [IU] via INTRAVENOUS
  Filled 2021-11-09: qty 5

## 2021-11-09 NOTE — Progress Notes (Signed)
Nutrition Follow-up: ? ?Patient with stage IV squamous cell lung cancer.  Patient treatment of taxotere and cyramza on hold today.   ? ?Met with patient during infusion of IV fluids.  Patient reports that he is eating.  Eating about 2 meals per day. Usually eats eggs, toast, sausage, bacon, juice for breakfast.  Often skips lunch and eats dinner of meat and couple sides.  Diarrhea has resolved after having c-diff.  Says that he is not drinking oral nutrition supplements because they don't agree with his stomach.   ? ? ?Medications: reviewed ? ?Labs: reviewed ? ?Anthropometrics:  ? ?Weight 124 lb today ? ?127 lb 8.6 oz on 4/19 ?134 lb  08/03/21 ?126 lb 06/08/21 ? ? ?NUTRITION DIAGNOSIS: Inadequate oral intake ongoing with weight loss ? ? ?INTERVENTION:  ?Encouraged patient to continue to add calories and protein to diet to prevent further weight loss.  ?Gave samples of ensure clear for patient to try.   ?  ? ?MONITORING, EVALUATION, GOAL: weight trends, intake ? ? ?NEXT VISIT: as needed ? ?Siomara Burkel B. Zenia Resides, RD, LDN ?Registered Dietitian ?[phone] [phone] ? ? ?

## 2021-11-09 NOTE — Progress Notes (Signed)
?Hematology/Oncology Progress note ?[phone] [phone] ?  ? ? ? ?Patient Care Team: ?Tracie Harrier, MD as PCP - General (Internal Medicine) ?Earlie Server, MD as Consulting Physician (Hematology and Oncology) ? ?REASON FOR VISIT:  ?Follow up for treatment of squamous lung cancer.  ? ?HISTORY OF PRESENTING ILLNESS:  ?# Dec 2019 Stage I squamous lung cancer ?#s/p Bronchoscopy on 06/25/2018. subcarina EBUS FNA was non diagnostic, hypocellular specimen.  ?# 08/13/2018 CT guided right upper lobe biopsy pathology showed dense fibrosis and mixed inflammatory cells with prominent polytypic plasma cells component. Focal benign bronchial wall and alveolar spaces. No malignancy was identified.  ? ?# 08/13/2018 underwent right thoracotomy and wedge resection of a right upper lobe mass.  Frozen section was consistent with an inflammatory process.  On the second postop day, preliminary pathology reports high-grade malignancy.  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  08/20/2018 Patient was therefore taken back to the OR and underwent take complete lobectomy  pT1b pN0 cM0 stage I squamous cell lung cancer.  ?Margin is negative.  Recommend observation.   ? ?# 12/03/2018 CT chest w contrast showed local recurrence.  ?12/10/2018 PET showed No evidence of distant metastatic disease ?# 12/26/2018 s/p bronchoscopy biopsy. Confirmed local recurrence of squamous cell lung cancer.  ?medi port placed by Dr.Oaks. ? ?# 06/14/2020 CT chest w contrast was reviewed and discussed with patient.  ?Evidence of progressive lymphangitic spread of tumor throughout the right lung, with contralateral lung nodules with  right pleural effusion  ?MRI brain is negative for CNS involvement.  CT abdomen with contrast showed no evidence of abdominal metastatic disease ?PET scan was not approved by insurance-peer to peer appeal with Dr.Vipul Bhanderi  ? ?# NGS - KRAS V14L,  TMB 26.6-high, MS stable, PD-L1 5% ?CDKN2C G48,  DICER1c.2117-1G>T, FLT4 G1131S, NF1 A427f, NF1 LY5638, STK11 N1816f?Cancer treatment ?Started concurrent chemoradiation on 01/16/2019 ?Carboplatin AUC of 2 and Taxol 45 mg/m2 weekly finished in 03/05/2019 ?04/10/2019, patient started on durvalumab maintenance. ?-CT showed progression.  Insurance denied PET scan evaluation. ?07/16/2020-09/28/2020 4 cycles of carboplatin/paclitaxel/Keytruda ? ?10/15/2020, CT chest abdomen pelvis redemonstrated postoperative and postradiation appearance of the right chest with perihilar consolidation and fibrosis.  Slight interval decrease in size of the pulmonary nodules associated with interlobular septal thickening nearly complete resolution of previously seen left-sided pulmonary nodules.  Minimal irregular residual.  Right pleural effusion improved.  Consistent with treatment response.  No evidence of metastatic disease with the abdomen or pelvis.  None obstructive bilateral nephrolithiasis ?- partial response to the treatment- ?10/19/2020, Keytruda monotherapy maintenance ? ?12/15/2020, CT chest without contrast showed resolution of lung nodules, no evidence of recurrent disease ?12/30/2020, patient was admitted due to unprovoked extensive deep vein thrombosis from calf vein to the common femoral vein on the left.  Patient was started on heparin drip.  Patient underwent thrombolysis and is a colectomy on 12/31/2020.  Anticoagulation was switched to Eliquis at discharge. ? ?04/22/2021, CT chest with contrast showed none occlusive pulmonary embolism, ?Interval progression of consolidation airspace opacity in the medial right middle lobe with bandlike opacity and bronchiectasis in the perihilar right lung,-this is compatible with evolving posttreatment changes although recurrent disease anteriorly is not excluded. ?Interval development of numerous bilateral pulmonary nodules, measuring up to 11 mm in the right lung base.  Concerning for metastatic disease.  Tiny right pleural effusion.   ? ?04/26/2021, resumed on carboplatin/Taxol/Keytruda. ?Patient did not tolerate his chemotherapy of carboplatin/Taxol/Keytruda on 04/26/2021. ?Patient has developed severe anemia status post  PRBC transfusion. ?05/25/2021, carboplatin AUC 5 with Keytruda ?06/23/2021  continued on carboplatin AUC 5 with Keytruda ?07/07/2021 carboplatin with [name]  ? ?07/15/2021 CT chest with contrast showed enlarged right lower lobe thick-walled cavitary lesion in the anterior aspect of the right lower lobe.  Concerning for progression.  Multiple other small pulmonary nodules are also noted elsewhere in the lungs, many of which are stable compared to prior studies.  Several are new or clearly enlarged.  The best example is an enlarging pulmonary nodule in the left upper lobe, 8 x 6 mm comparing to 4 x 3 mm on 04/22/2021. ? ?#January 2023 patient was hospitalized due to hyperkalemia. Patient was given calcium gluconate, nebulized albuterol and IV Lasix.  He was observed overnight. potassium improved and patient was discharged.  He has stopped taking potassium supplementation. ? ?08/03/2021 - 09/14/2021, docetaxel/Cyramza with G-CSF support x 3  ?10/05/2021, C. difficile colitis, treated with Dificid 200 mg twice daily for 10 days. ? ?10/26/2021, cycle 4 docetaxel and Cyramza. ? ?INTERVAL HISTORY ?Samuel Choi is a 56 y.o. male who has above history reviewed by me today presents for reatment of  recurrent  squamous cell carcinoma. ?No recurrent diarrhea symptoms after last chemotherapy. ?Appetite has decreased. ?Patient has lost 4 pounds since last visit. ?Chronic shortness of breath, unchanged. ?Denies any fever or chills, dysuria, increased frequency, diarrhea. ? ?  Review of Systems  ?Constitutional:  Positive for fatigue. Negative for appetite change, chills and fever.  ?HENT:   Negative for hearing loss and voice change.   ?Eyes:  Negative for eye problems and icterus.  ?Respiratory:  Positive for shortness of breath. Negative for  chest tightness and cough.   ?Cardiovascular:  Negative for chest pain and leg swelling.  ?Gastrointestinal:  Negative for abdominal distention, abdominal pain, nausea and vomiting.  ?Endocrine: Negative for hot flashes.  ?Genitourinary:  Negative for difficulty urinating, dysuria and frequency.   ?Musculoskeletal:  Negative for arthralgias.  ?Skin:  Negative for itching and rash.  ?Neurological:  Negative for light-headedness and numbness.  ?Hematological:  Negative for adenopathy. Does not bruise/bleed easily.  ?Psychiatric/Behavioral:  Negative for confusion.   ? ?MEDICAL HISTORY:  ?Past Medical History:  ?Diagnosis Date  ? Alcohol abuse   ? usually drinks 2-3 drinks per day  ? Atherosclerosis 06/2018  ? Chronic sinusitis   ? Dehydration 02/07/2019  ? Emphysema of lung ([place]) 06/2018  ? patient unaware of this.  ? Hip fracture ([place]) 05/2018  ? no surgery  ? History of kidney stones 05/2018  ? per xray, bilateral nephrolitiasis  ? Hypertension   ? Squamous cell carcinoma of lung, right ([place]) 06/2018  ? ? ?SURGICAL HISTORY: ?Past Surgical History:  ?Procedure Laterality Date  ? BRAIN SURGERY  10/2017  ? nasal/sinus endoscopy. mass benign  ? ELECTROMAGNETIC NAVIGATION BROCHOSCOPY Right 06/25/2018  ? Procedure: ELECTROMAGNETIC NAVIGATION BRONCHOSCOPY;  Surgeon: [name], MD;  Location: [facility] ORS;  Service: Cardiopulmonary;  Laterality: Right;  ? ENDOBRONCHIAL ULTRASOUND Right 06/25/2018  ? Procedure: ENDOBRONCHIAL ULTRASOUND;  Surgeon: [name], MD;  Location: [facility] ORS;  Service: Cardiopulmonary;  Laterality: Right;  ? ENDOBRONCHIAL ULTRASOUND Right 12/26/2018  ? Procedure: ENDOBRONCHIAL ULTRASOUND RIGHT;  Surgeon: [name], MD;  Location: [facility] ORS;  Service: Cardiopulmonary;  Laterality: Right;  ? FLEXIBLE BRONCHOSCOPY N/A 08/20/2018  ? Procedure: FLEXIBLE BRONCHOSCOPY PREOP;  Surgeon: [name], MD;  Location: [facility] ORS;  Service: Thoracic;  Laterality: N/A;  ? IR CV LINE INJECTION  04/04/2019  ? NASAL  SINUS  SURGERY  10/2017  ? At [facility], frontal sinusotomy, ethmoidectomy, resection anterior cranial fossa neoplasm, turbinate resection  ? PERIPHERAL VASCULAR THROMBECTOMY Left 12/31/2020  ? Procedure: PERIPHERAL VASCULAR THROMBECT

## 2021-11-11 ENCOUNTER — Inpatient Hospital Stay: Payer: 59

## 2021-11-14 ENCOUNTER — Telehealth: Payer: Self-pay

## 2021-11-14 NOTE — Telephone Encounter (Signed)
Faxed FMLA forms for patients wife on 11/10/2021. Will scan into chart.  ?

## 2021-11-15 ENCOUNTER — Ambulatory Visit
Admission: RE | Admit: 2021-11-15 | Discharge: 2021-11-15 | Disposition: A | Discharge status: Home / Self Care | Payer: 59 | Source: Ambulatory Visit | Attending: Oncology | Admitting: Oncology

## 2021-11-15 DIAGNOSIS — C3491 Malignant neoplasm of unspecified part of right bronchus or lung: Secondary | ICD-10-CM

## 2021-11-15 MED ORDER — IOHEXOL 300 MG/ML  SOLN
100.0000 mL | Freq: Once | INTRAMUSCULAR | Status: AC | PRN
  Administered 2021-11-15: 100 mL via INTRAVENOUS

## 2021-11-18 ENCOUNTER — Inpatient Hospital Stay: Payer: 59

## 2021-11-18 ENCOUNTER — Telehealth: Payer: Self-pay

## 2021-11-18 ENCOUNTER — Encounter: Payer: Self-pay | Admitting: Oncology

## 2021-11-18 ENCOUNTER — Inpatient Hospital Stay (HOSPITAL_BASED_OUTPATIENT_CLINIC_OR_DEPARTMENT_OTHER): Payer: 59 | Admitting: Oncology

## 2021-11-18 VITALS — BP 133/70 | HR 97 | Temp 96.5°F | Ht 73.0 in | Wt 126.0 lb

## 2021-11-18 DIAGNOSIS — Z5111 Encounter for antineoplastic chemotherapy: Secondary | ICD-10-CM

## 2021-11-18 DIAGNOSIS — D6481 Anemia due to antineoplastic chemotherapy: Secondary | ICD-10-CM | POA: Diagnosis not present

## 2021-11-18 DIAGNOSIS — N321 Vesicointestinal fistula: Secondary | ICD-10-CM | POA: Insufficient documentation

## 2021-11-18 DIAGNOSIS — Z86718 Personal history of other venous thrombosis and embolism: Secondary | ICD-10-CM | POA: Diagnosis not present

## 2021-11-18 DIAGNOSIS — K5730 Diverticulosis of large intestine without perforation or abscess without bleeding: Secondary | ICD-10-CM

## 2021-11-18 DIAGNOSIS — E860 Dehydration: Secondary | ICD-10-CM

## 2021-11-18 DIAGNOSIS — C3491 Malignant neoplasm of unspecified part of right bronchus or lung: Secondary | ICD-10-CM | POA: Diagnosis not present

## 2021-11-18 DIAGNOSIS — C3411 Malignant neoplasm of upper lobe, right bronchus or lung: Secondary | ICD-10-CM | POA: Diagnosis not present

## 2021-11-18 DIAGNOSIS — T451X5A Adverse effect of antineoplastic and immunosuppressive drugs, initial encounter: Secondary | ICD-10-CM

## 2021-11-18 LAB — CBC WITH DIFFERENTIAL/PLATELET
Abs Immature Granulocytes: 0.16 10*3/uL — ABNORMAL HIGH (ref 0.00–0.07)
Basophils Absolute: 0.2 10*3/uL — ABNORMAL HIGH (ref 0.0–0.1)
Basophils Relative: 1 %
Eosinophils Absolute: 0.2 10*3/uL (ref 0.0–0.5)
Eosinophils Relative: 1 %
HCT: 31.9 % — ABNORMAL LOW (ref 39.0–52.0)
Hemoglobin: 10.8 g/dL — ABNORMAL LOW (ref 13.0–17.0)
Immature Granulocytes: 1 %
Lymphocytes Relative: 12 %
Lymphs Abs: 2.6 10*3/uL (ref 0.7–4.0)
MCH: 35.1 pg — ABNORMAL HIGH (ref 26.0–34.0)
MCHC: 33.9 g/dL (ref 30.0–36.0)
MCV: 103.6 fL — ABNORMAL HIGH (ref 80.0–100.0)
Monocytes Absolute: 1.6 10*3/uL — ABNORMAL HIGH (ref 0.1–1.0)
Monocytes Relative: 8 %
Neutro Abs: 16.2 10*3/uL — ABNORMAL HIGH (ref 1.7–7.7)
Neutrophils Relative %: 77 %
Platelets: 228 10*3/uL (ref 150–400)
RBC: 3.08 MIL/uL — ABNORMAL LOW (ref 4.22–5.81)
RDW: 16.5 % — ABNORMAL HIGH (ref 11.5–15.5)
WBC: 20.8 10*3/uL — ABNORMAL HIGH (ref 4.0–10.5)
nRBC: 0 % (ref 0.0–0.2)

## 2021-11-18 LAB — COMPREHENSIVE METABOLIC PANEL
ALT: 5 U/L (ref 0–44)
AST: 14 U/L — ABNORMAL LOW (ref 15–41)
Albumin: 2.8 g/dL — ABNORMAL LOW (ref 3.5–5.0)
Alkaline Phosphatase: 101 U/L (ref 38–126)
Anion gap: 13 (ref 5–15)
BUN: 10 mg/dL (ref 6–20)
CO2: 21 mmol/L — ABNORMAL LOW (ref 22–32)
Calcium: 8.2 mg/dL — ABNORMAL LOW (ref 8.9–10.3)
Chloride: 102 mmol/L (ref 98–111)
Creatinine, Ser: 0.78 mg/dL (ref 0.61–1.24)
GFR, Estimated: >60 mL/min (ref >60)
Glucose, Bld: 80 mg/dL (ref 70–99)
Potassium: 3.8 mmol/L (ref 3.5–5.1)
Sodium: 136 mmol/L (ref 135–145)
Total Bilirubin: 0.4 mg/dL (ref 0.3–1.2)
Total Protein: 6.7 g/dL (ref 6.5–8.1)

## 2021-11-18 MED ORDER — SODIUM CHLORIDE 0.9 % IV SOLN
Freq: Once | INTRAVENOUS | Status: AC
  Filled 2021-11-18: qty 250

## 2021-11-18 MED ORDER — HEPARIN SOD (PORK) LOCK FLUSH 100 UNIT/ML IV SOLN
INTRAVENOUS | Status: AC
  Administered 2021-11-18: 500 [IU]
  Filled 2021-11-18: qty 5

## 2021-11-18 NOTE — Progress Notes (Signed)
?Hematology/Oncology Progress note ?[phone] [phone] ?  ? ? ? ?Patient Care Team: ?Tracie Harrier, MD as PCP - General (Internal Medicine) ?Earlie Server, MD as Consulting Physician (Hematology and Oncology) ? ?REASON FOR VISIT:  ?Follow up for treatment of squamous lung cancer.  ? ?HISTORY OF PRESENTING ILLNESS:  ?# Dec 2019 Stage I squamous lung cancer ?#s/p Bronchoscopy on 06/25/2018. subcarina EBUS FNA was non diagnostic, hypocellular specimen.  ?# 08/13/2018 CT guided right upper lobe biopsy pathology showed dense fibrosis and mixed inflammatory cells with prominent polytypic plasma cells component. Focal benign bronchial wall and alveolar spaces. No malignancy was identified.  ? ?# 08/13/2018 underwent right thoracotomy and wedge resection of a right upper lobe mass.  Frozen section was consistent with an inflammatory process.  On the second postop day, preliminary pathology reports high-grade malignancy.  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  08/20/2018 Patient was therefore taken back to the OR and underwent take complete lobectomy  pT1b pN0 cM0 stage I squamous cell lung cancer.  ?Margin is negative.  Recommend observation.   ? ?# 12/03/2018 CT chest w contrast showed local recurrence.  ?12/10/2018 PET showed No evidence of distant metastatic disease ?# 12/26/2018 s/p bronchoscopy biopsy. Confirmed local recurrence of squamous cell lung cancer.  ?medi port placed by Dr.Oaks. ? ?# 06/14/2020 CT chest w contrast was reviewed and discussed with patient.  ?Evidence of progressive lymphangitic spread of tumor throughout the right lung, with contralateral lung nodules with  right pleural effusion  ?MRI brain is negative for CNS involvement.  CT abdomen with contrast showed no evidence of abdominal metastatic disease ?PET scan was not approved by insurance-peer to peer appeal with Dr.Vipul Bhanderi  ? ?# NGS - KRAS V14L,  TMB 26.6-high, MS stable, PD-L1 5% ?CDKN2C G48,  DICER1c.2117-1G>T, FLT4 G1131S, NF1 A461f, NF1 LR4270, STK11 N1866f?Cancer treatment ?Started concurrent chemoradiation on 01/16/2019 ?Carboplatin AUC of 2 and Taxol 45 mg/m2 weekly finished in 03/05/2019 ?04/10/2019, patient started on durvalumab maintenance. ?-CT showed progression.  Insurance denied PET scan evaluation. ?07/16/2020-09/28/2020 4 cycles of carboplatin/paclitaxel/Keytruda ? ?10/15/2020, CT chest abdomen pelvis redemonstrated postoperative and postradiation appearance of the right chest with perihilar consolidation and fibrosis.  Slight interval decrease in size of the pulmonary nodules associated with interlobular septal thickening nearly complete resolution of previously seen left-sided pulmonary nodules.  Minimal irregular residual.  Right pleural effusion improved.  Consistent with treatment response.  No evidence of metastatic disease with the abdomen or pelvis.  None obstructive bilateral nephrolithiasis ?- partial response to the treatment- ?10/19/2020, Keytruda monotherapy maintenance ? ?12/15/2020, CT chest without contrast showed resolution of lung nodules, no evidence of recurrent disease ?12/30/2020, patient was admitted due to unprovoked extensive deep vein thrombosis from calf vein to the common femoral vein on the left.  Patient was started on heparin drip.  Patient underwent thrombolysis and is a colectomy on 12/31/2020.  Anticoagulation was switched to Eliquis at discharge. ? ?04/22/2021, CT chest with contrast showed none occlusive pulmonary embolism, ?Interval progression of consolidation airspace opacity in the medial right middle lobe with bandlike opacity and bronchiectasis in the perihilar right lung,-this is compatible with evolving posttreatment changes although recurrent disease anteriorly is not excluded. ?Interval development of numerous bilateral pulmonary nodules, measuring up to 11 mm in the right lung base.  Concerning for metastatic disease.  Tiny right pleural effusion.   ? ?04/26/2021, resumed on carboplatin/Taxol/Keytruda. ?Patient did not tolerate his chemotherapy of carboplatin/Taxol/Keytruda on 04/26/2021. ?Patient has developed severe anemia status post  PRBC transfusion. ?05/25/2021, carboplatin AUC 5 with Keytruda ?06/23/2021  continued on carboplatin AUC 5 with Keytruda ?07/07/2021 carboplatin with [name]  ? ?07/15/2021 CT chest with contrast showed enlarged right lower lobe thick-walled cavitary lesion in the anterior aspect of the right lower lobe.  Concerning for progression.  Multiple other small pulmonary nodules are also noted elsewhere in the lungs, many of which are stable compared to prior studies.  Several are new or clearly enlarged.  The best example is an enlarging pulmonary nodule in the left upper lobe, 8 x 6 mm comparing to 4 x 3 mm on 04/22/2021. ? ?#January 2023 patient was hospitalized due to hyperkalemia. Patient was given calcium gluconate, nebulized albuterol and IV Lasix.  He was observed overnight. potassium improved and patient was discharged.  He has stopped taking potassium supplementation. ? ?08/03/2021 - 09/14/2021, docetaxel/Cyramza with G-CSF support x 3  ?10/05/2021, C. difficile colitis, treated with Dificid 200 mg twice daily for 10 days. ? ?10/26/2021, cycle 4 docetaxel and Cyramza. ? ?INTERVAL HISTORY ?Samuel Choi is a 56 y.o. male who has above history reviewed by me today presents for reatment of  recurrent  squamous cell carcinoma. ?Patient denies fever, chills, diarrhea, abdominal pain, dysuria symptoms.  Patient does not like nutrition supplements.  He tries to add calories to his diet.  He has gained 2 pounds since last visit.  Otherwise no new complaints. ? ? ?  Review of Systems  ?Constitutional:  Positive for fatigue. Negative for appetite change, chills and fever.  ?HENT:   Negative for hearing loss and voice change.   ?Eyes:  Negative for eye problems and icterus.  ?Respiratory:  Positive for shortness of breath. Negative for  chest tightness and cough.   ?Cardiovascular:  Negative for chest pain and leg swelling.  ?Gastrointestinal:  Negative for abdominal distention, abdominal pain, nausea and vomiting.  ?Endocrine: Negative for hot flashes.  ?Genitourinary:  Negative for difficulty urinating, dysuria and frequency.   ?Musculoskeletal:  Negative for arthralgias.  ?Skin:  Negative for itching and rash.  ?Neurological:  Negative for light-headedness and numbness.  ?Hematological:  Negative for adenopathy. Does not bruise/bleed easily.  ?Psychiatric/Behavioral:  Negative for confusion.   ? ?MEDICAL HISTORY:  ?Past Medical History:  ?Diagnosis Date  ? Alcohol abuse   ? usually drinks 2-3 drinks per day  ? Atherosclerosis 06/2018  ? Chronic sinusitis   ? Dehydration 02/07/2019  ? Emphysema of lung ([facility]) 06/2018  ? patient unaware of this.  ? Hip fracture ([facility]) 05/2018  ? no surgery  ? History of kidney stones 05/2018  ? per xray, bilateral nephrolitiasis  ? Hypertension   ? Squamous cell carcinoma of lung, right ([facility]) 06/2018  ? ? ?SURGICAL HISTORY: ?Past Surgical History:  ?Procedure Laterality Date  ? BRAIN SURGERY  10/2017  ? nasal/sinus endoscopy. mass benign  ? ELECTROMAGNETIC NAVIGATION BROCHOSCOPY Right 06/25/2018  ? Procedure: ELECTROMAGNETIC NAVIGATION BRONCHOSCOPY;  Surgeon: [name], MD;  Location: [facility];  Service: Cardiopulmonary;  Laterality: Right;  ? ENDOBRONCHIAL ULTRASOUND Right 06/25/2018  ? Procedure: ENDOBRONCHIAL ULTRASOUND;  Surgeon: [name], MD;  Location: [facility];  Service: Cardiopulmonary;  Laterality: Right;  ? ENDOBRONCHIAL ULTRASOUND Right 12/26/2018  ? Procedure: ENDOBRONCHIAL ULTRASOUND RIGHT;  Surgeon: [name], MD;  Location: [facility];  Service: Cardiopulmonary;  Laterality: Right;  ? FLEXIBLE BRONCHOSCOPY N/A 08/20/2018  ? Procedure: FLEXIBLE BRONCHOSCOPY PREOP;  Surgeon: [name], MD;  Location: [facility];  Service: Thoracic;  Laterality: N/A;  ? IR CV LINE  INJECTION  04/04/2019  ? NASAL  SINUS SURGERY  10/2017  ? At [facility], frontal sinusotomy, ethmoidectomy, resection anterior cranial fossa neoplasm, turbinate resection  ? PERIPHERAL VASCULAR THROMBECTOMY Left 12/31/2020  ? Procedure: PERIPHERAL VASCULAR THROMBECTOMY

## 2021-11-18 NOTE — Telephone Encounter (Signed)
Per Dr. Patsey Berthold via epic secure chat. Patient will need f/u after PET.  ?Will contact patient once PET has been scheduled.  ?

## 2021-11-21 ENCOUNTER — Other Ambulatory Visit: Payer: Self-pay

## 2021-11-21 ENCOUNTER — Ambulatory Visit
Admission: RE | Admit: 2021-11-21 | Discharge: 2021-11-21 | Disposition: A | Discharge status: Home / Self Care | Payer: 59 | Source: Ambulatory Visit | Attending: Oncology | Admitting: Oncology

## 2021-11-21 DIAGNOSIS — Z923 Personal history of irradiation: Secondary | ICD-10-CM | POA: Diagnosis not present

## 2021-11-21 DIAGNOSIS — C3491 Malignant neoplasm of unspecified part of right bronchus or lung: Secondary | ICD-10-CM | POA: Insufficient documentation

## 2021-11-21 DIAGNOSIS — Z902 Acquired absence of lung [part of]: Secondary | ICD-10-CM | POA: Diagnosis not present

## 2021-11-21 DIAGNOSIS — J984 Other disorders of lung: Secondary | ICD-10-CM | POA: Diagnosis not present

## 2021-11-21 DIAGNOSIS — C3490 Malignant neoplasm of unspecified part of unspecified bronchus or lung: Secondary | ICD-10-CM | POA: Diagnosis present

## 2021-11-21 DIAGNOSIS — R918 Other nonspecific abnormal finding of lung field: Secondary | ICD-10-CM

## 2021-11-21 LAB — GLUCOSE, CAPILLARY: Glucose-Capillary: 110 mg/dL — ABNORMAL HIGH (ref 70–99)

## 2021-11-21 MED ORDER — FLUDEOXYGLUCOSE F - 18 (FDG) INJECTION
6.5000 | Freq: Once | INTRAVENOUS | Status: AC | PRN
  Administered 2021-11-21: 7.1 via INTRAVENOUS

## 2021-11-21 NOTE — Telephone Encounter (Signed)
Spoke to patient and scheduled appt 11/23/2021 at 4:30. ?Pt voiced understanding and had no further questions.  ?Nothing further needed.  ?

## 2021-11-22 NOTE — Telephone Encounter (Signed)
Lattie Haw called stating that her employer had not received FMLA forms. Forms refaxed to [employer]. Fax confirmation received.  ? ?Fax [phone] ?Leave ID: [id number] ?

## 2021-11-23 ENCOUNTER — Encounter: Payer: Self-pay | Admitting: Pulmonary Disease

## 2021-11-23 ENCOUNTER — Ambulatory Visit (INDEPENDENT_AMBULATORY_CARE_PROVIDER_SITE_OTHER): Payer: 59 | Admitting: Pulmonary Disease

## 2021-11-23 VITALS — BP 130/76 | HR 100 | Temp 97.8°F | Ht 73.0 in | Wt 130.0 lb

## 2021-11-23 DIAGNOSIS — R911 Solitary pulmonary nodule: Secondary | ICD-10-CM

## 2021-11-23 DIAGNOSIS — C3491 Malignant neoplasm of unspecified part of right bronchus or lung: Secondary | ICD-10-CM

## 2021-11-23 DIAGNOSIS — J984 Other disorders of lung: Secondary | ICD-10-CM

## 2021-11-23 NOTE — Patient Instructions (Signed)
At present the findings in your lung are inconclusive and appear to be more related to inflammation. ? ?Sometimes the [name] which is a medication you are taking for the lung cancer can cause the spots as well. ? ?I will talk to Dr. Tasia Catchings about maybe increasing your steroids for a little bit and then we will repeat a CT scan on 9 August and see how things are going with that.  I will see you in follow-up at that time. ?

## 2021-11-23 NOTE — Progress Notes (Signed)
Subjective:    Patient ID: Samuel Choi, male    DOB: April 27, 1966, 56 y.o.   MRN: [id number] Patient Care Team: Barbette Reichmann, MD as PCP - General (Internal Medicine) Rickard Patience, MD as Consulting Physician (Hematology and Oncology)  Chief Complaint  Patient presents with   Follow-up    PET 11/21/2021--sob with exertion.    HPI Patient is a 56 year old former smoker, with a history as noted below, who presents for evaluation of abnormal imaging of the lung in the setting of squamous cell carcinoma of the right lung.  Patient referred for consideration of bronchoscopy.  He is kindly rereferred by Dr. Rickard Patience.  Recall the patient had been previously seen by Dr. Santiago Glad in 2020 for diagnosis of his cancer and required thoracotomy and thoracotomy redo for completion of excision of the tumor.  The patient had been on Keytruda and had imaging performed on 15 Nov 2021 showing a thick-walled cavity on the right lower lobe that had thinned out and had some evolution concerning for infectious etiology.  Is also an enlarging mixed cystic and solid nodule in the left upper lobe which was concerning for a metastatic lesion.  The patient then underwent a PET/CT on 15 May with the nodular opacity in the left upper lobe concerning for potential metastasis however a very low hypermetabolism and the lesions on the right lower lobe consistent more with inflammation.  There are also extensive radiation changes in the right hemithorax.  We are asked to render opinion on these findings today.  The patient is currently recovering from C. difficile colitis and actually was still having some issues with this.  He was also being treated for potential Keytruda induced pneumonitis with prednisone. The patient currently is not inclined to have any invasive procedures and appears to be quite debilitated.  We discussed next steps and he has elected to follow this finding expectantly with a repeat CT. We discussed repeating  another short course of steroids.  The patient has not had any fevers, chills or sweats.  Patient has not had any hemoptysis.  He states he is actually feeling somewhat better.  He does not interact much during the visit today.  He presents in a transport chair and has noted appears very debilitated.   I reviewed chest films in detail with the patient and representative images are as noted below.   Review of Systems A 10 point review of systems was performed and it is as noted above otherwise negative.  Past Medical History:  Diagnosis Date   Alcohol abuse    usually drinks 2-3 drinks per day   Atherosclerosis 06/2018   Chronic sinusitis    Dehydration 02/07/2019   Emphysema of lung (HCC) 06/2018   patient unaware of this.   Hip fracture ([facility]CC) 05/2018   no surgery   History of kidney stones 05/2018   per xray, bilateral nephrolitiasis   Hypertension    Squamous cell carcinoma of lung, right (HCC) 06/2018   Past Surgical History:  Procedure Laterality Date   BRAIN SURGERY  10/2017   nasal/sinus endoscopy. mass benign   ELECTROMAGNETIC NAVIGATION BROCHOSCOPY Right 06/25/2018   Procedure: ELECTROMAGNETIC NAVIGATION BRONCHOSCOPY;  Surgeon: Erin Fulling, MD;  Location: [facility];  Service: Cardiopulmonary;  Laterality: Right;   ENDOBRONCHIAL ULTRASOUND Right 06/25/2018   Procedure: ENDOBRONCHIAL ULTRASOUND;  Surgeon: Erin Fulling, MD;  Location: [facility];  Service: Cardiopulmonary;  Laterality: Right;   ENDOBRONCHIAL ULTRASOUND Right 12/26/2018   Procedure: ENDOBRONCHIAL ULTRASOUND RIGHT;  Surgeon: Erin Fulling, MD;  Location: [facility];  Service: Cardiopulmonary;  Laterality: Right;   FLEXIBLE BRONCHOSCOPY N/A 08/20/2018   Procedure: FLEXIBLE BRONCHOSCOPY PREOP;  Surgeon: Hulda Marin, MD;  Location: [facility];  Service: Thoracic;  Laterality: N/A;   IR CV LINE INJECTION  04/04/2019   NASAL SINUS SURGERY  10/2017   At [facility], frontal sinusotomy, ethmoidectomy, resection anterior cranial  fossa neoplasm, turbinate resection   PERIPHERAL VASCULAR THROMBECTOMY Left 12/31/2020   Procedure: PERIPHERAL VASCULAR THROMBECTOMY;  Surgeon: Annice Needy, MD;  Location: [facility];  Service: Cardiovascular;  Laterality: Left;   PORTACATH PLACEMENT Left 01/15/2019   Procedure: INSERTION PORT-A-CATH;  Surgeon: Hulda Marin, MD;  Location: [facility];  Service: General;  Laterality: Left;   THORACOTOMY Right 08/13/2018   Procedure: PREOP BROCHOSCOPY WITH RIGHT THORACOTOMY AND RUL RESECTION;  Surgeon: Hulda Marin, MD;  Location: [facility];  Service: General;  Laterality: Right;   THORACOTOMY Right 08/20/2018   Procedure: THORACOTOMY MAJOR RIGHT UPPER LOBE LOBECTOMY;  Surgeon: Hulda Marin, MD;  Location: [facility];  Service: Thoracic;  Laterality: Right;   TOE SURGERY Left    pin in left toe   Patient Active Problem List   Diagnosis Date Noted   Diverticulosis of colon 11/18/2021   History of thrombosis 11/18/2021   Weight loss 11/09/2021   C. difficile colitis 10/19/2021   Hyperkalemia 07/28/2021   Severe protein-calorie malnutrition (HCC) 06/08/2021   Antineoplastic chemotherapy induced anemia 06/08/2021   Pulmonary embolus (HCC) 06/08/2021   Chemotherapy induced nausea and vomiting 05/17/2021   Symptomatic anemia 05/17/2021   Acute deep vein thrombosis (DVT) of femoral vein of left lower extremity (HCC) 01/11/2021   Wide-complex tachycardia    Acute deep vein thrombosis (DVT) (HCC) 12/30/2020   Chemotherapy induced neutropenia (HCC) 11/09/2020   COPD exacerbation (HCC) 04/22/2020   Cough 03/31/2020   Sinus tachycardia 12/18/2019   Hypomagnesemia 12/10/2019   Encounter for antineoplastic immunotherapy 11/24/2019   Ex-smoker 07/23/2019   Palpitations 06/13/2019   Port-A-Cath in place 04/04/2019   Thrombocytopenia (HCC) 04/04/2019   Folate deficiency 04/04/2019   Hypokalemia 04/04/2019   B12 deficiency 04/04/2019   Alcohol abuse 04/04/2019   Dehydration 02/07/2019    Encounter for antineoplastic chemotherapy 01/31/2019   Squamous cell carcinoma of right lung (HCC) 09/03/2018   Goals of care, counseling/discussion 09/03/2018   Malnutrition of moderate degree 08/23/2018   Lung mass 08/13/2018   Adenopathy    Emphysema of lung (HCC) 06/2018   Allergic rhinitis 06/2018   H/O nasal polyp 02/20/2018   Acute sinusitis 10/10/2017   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/10/2017   Chronic rhinitis 10/10/2017   Mass of sinus 10/10/2017   Essential hypertension 09/27/2017   Tobacco abuse 09/27/2017   Family History  Problem Relation Age of Onset   Breast cancer Mother    Diabetes Mother    Lung cancer Father    Hypertension Father    Social History   Tobacco Use   Smoking status: Former    Packs/day: 0.50    Years: 15.00    Pack years: 7.50    Types: Cigarettes    Quit date: 08/2018    Years since quitting: 3.2   Smokeless tobacco: Never  Substance Use Topics   Alcohol use: Yes    Alcohol/week: 3.0 standard drinks    Types: 3 Cans of beer per week    Comment: usually 2 drinks per week, per patient   No Known Allergies Current Meds  Medication Sig   albuterol (  VENTOLIN HFA) 108 (90 Base) MCG/ACT inhaler INHALE 2 PUFFS BY MOUTH EVERY 6 HOURS AS NEEDED FOR WHEEZE OR SHORTNESS OF BREATH   apixaban (ELIQUIS) 2.5 MG TABS tablet Take 1 tablet (2.5 mg total) by mouth 2 (two) times daily.   budesonide-formoterol (SYMBICORT) 80-4.5 MCG/ACT inhaler Inhale 2 puffs into the lungs 2 (two) times daily. in the morning and at bedtime.   dexamethasone (DECADRON) 4 MG tablet Take 2 tablets (8 mg total) by mouth 2 (two) times daily. Start the day before Taxotere. Then daily after chemo for 2 days.   diltiazem (CARDIZEM CD) 120 MG 24 hr capsule Take 120 mg by mouth daily.   fidaxomicin (DIFICID) 200 MG TABS tablet Take 1 tablet (200 mg total) by mouth 2 (two) times daily.   folic acid (V-R FOLIC ACID) 400 MCG tablet Take 1 tablet (400 mcg total) by mouth daily.    HYDROcodone-acetaminophen (NORCO/VICODIN) 5-325 MG tablet Take 1 tablet by mouth every 8 (eight) hours as needed for severe pain.   latanoprost (XALATAN) 0.005 % ophthalmic solution SMARTSIG:In Eye(s)   loperamide (IMODIUM) 2 MG capsule Take 1 capsule (2 mg total) by mouth See admin instructions. Initial: 4 mg, followed by 2 mg after each loose stool; maximum: 16 mg/day   loratadine (CLARITIN) 10 MG tablet Take 10 mg by mouth daily.   methocarbamol (ROBAXIN) 500 MG tablet Take 1 tablet (500 mg total) by mouth at bedtime.   metoprolol succinate (TOPROL-XL) 25 MG 24 hr tablet Take 1 tablet (25 mg total) by mouth daily.   nystatin (MYCOSTATIN) 100000 UNIT/ML suspension Take 5 mLs (500,000 Units total) by mouth 4 (four) times daily. Swish and swallow.   olmesartan (BENICAR) 40 MG tablet Take 40 mg by mouth every other day.    ondansetron (ZOFRAN) 8 MG tablet Take 1 tablet (8 mg total) by mouth every 8 (eight) hours as needed for refractory nausea / vomiting. Start on day 3 after chemo.   pantoprazole (PROTONIX) 20 MG tablet Take 1 tablet (20 mg total) by mouth daily.   promethazine (PHENERGAN) 25 MG tablet Take 1 tablet (25 mg total) by mouth every 6 (six) hours as needed for nausea or vomiting.   SPIRIVA HANDIHALER 18 MCG inhalation capsule INHALE 1 CAPSULE VIA HANDIHALER ONCE DAILY AT THE SAME TIME EVERY DAY   vitamin B-12 (CYANOCOBALAMIN) 1000 MCG tablet Take 1 tablet (1,000 mcg total) by mouth daily.   Immunization History  Administered Date(s) Administered   Influenza Inj Mdck Quad Pf 03/28/2019   Influenza, High Dose Seasonal PF 03/28/2019   Moderna Sars-Covid-2 Vaccination 11/14/2019   Pneumococcal Conjugate-13 02/17/2021   Pneumococcal Polysaccharide-23 11/19/2018   Tdap 12/31/2017   Zoster Recombinat (Shingrix) 03/28/2019, 05/01/2019, 06/09/2019      Objective:   Physical Exam BP 130/76 (BP Location: Left Arm, Cuff Size: Normal)   Pulse 100   Temp 97.8 F (36.6 C) (Temporal)   Ht  6\' 1"  (1.854 m)   Wt 130 lb (59 kg)   SpO2 95%   BMI 17.15 kg/m  GENERAL: Thin, frail-appearing gentleman, presents in transport chair.  No acute distress. HEAD: Normocephalic, atraumatic.  Alopecia due to chemotherapy. EYES: Pupils equal, round, reactive to light.  No scleral icterus.  Occasional disconjugate gaze. MOUTH: Poor dentition, oral mucosa moist.  No thrush. NECK: Supple. No thyromegaly. Trachea midline. No JVD.  No adenopathy. PULMONARY: Good air entry bilaterally.  Localized wheezing and rhonchi on right mid lung field. CARDIOVASCULAR: S1 and S2. Regular rate and rhythm.  No rubs, murmurs or gallops heard. ABDOMEN: Scaphoid, otherwise benign. MUSCULOSKELETAL: No joint deformity, no clubbing, no edema.  Significant sarcopenia. NEUROLOGIC: Very debilitated cannot assess gait. SKIN: Intact,warm,dry. PSYCH: Flat affect.   Representative image from CT performed 15 Nov 2021, independently reviewed, showing the area of concern (arrow):   Representative image from PET/CT performed 23 Nov 2021, independently reviewed, showing hypermetabolism on the area in question (arrow):     Assessment & Plan:     ICD-10-CM   1. Cavitary lesion of lung  J98.4 CT CHEST WO CONTRAST   Suspect inflammation/infection Query pneumonitis Patient to debilitated Not optimal candidate for bronchoscopy    2. Squamous cell carcinoma of right lung (HCC)  C34.91    Direct complexity to his management Follows with oncology     Cussed with the patient's primary oncologist Dr. Cathie Hoops.  Will follow a observational approach for now.  The patient is too debilitated to received with bronchoscopy.  He is currently on a short course of steroids per oncology recommend extending the course for at least 10 to 14 days.  Patient is already scheduled for repeat CT on 9 August and we will see him after that time for reassessment.  Gailen Shelter, MD Advanced Bronchoscopy PCCM [facility]    *This  note was dictated using voice recognition software/Dragon.  Despite best efforts to proofread, errors can occur which can change the meaning. Any transcriptional errors that result from this process are unintentional and may not be fully corrected at the time of dictation.

## 2021-11-24 ENCOUNTER — Other Ambulatory Visit: Payer: 59

## 2021-11-24 ENCOUNTER — Telehealth: Payer: Self-pay

## 2021-11-24 NOTE — Progress Notes (Signed)
Tumor Board Documentation  Samuel Choi was presented by Dr Tasia Catchings and Dr Patsey Berthold at our Tumor Board on 11/24/2021, which included representatives from medical oncology, radiology, pathology, surgical, pharmacy, pulmonology, research, navigation, radiation oncology, internal medicine, palliative care.  Samuel Choi currently presents as a current patient, for discussion with history of the following treatments: surgical intervention(s), neoadjuvant chemoradiation, immunotherapy.  Additionally, we reviewed previous medical and familial history, history of present illness, and recent lab results along with all available histopathologic and imaging studies. The tumor board considered available treatment options and made the following recommendations: Active surveillance (CT Scan 02/22/22) Hold Chemotherapy for now due to C diff infection  The following procedures/referrals were also placed: No orders of the defined types were placed in this encounter.   Clinical Trial Status: not discussed   Staging used: AJCC Stage Group AJCC Staging:       Group: Stage I Squamous Cell Lung Cancer - right   National site-specific guidelines NCCN were discussed with respect to the case.  Tumor board is a meeting of clinicians from various specialty areas who evaluate and discuss patients for whom a multidisciplinary approach is being considered. Final determinations in the plan of care are those of the provider(s). The responsibility for follow up of recommendations given during tumor board is that of the provider.   Today's extended care, comprehensive team conference, Samuel Choi was not present for the discussion and was not examined.   Multidisciplinary Tumor Board is a multidisciplinary case peer review process.  Decisions discussed in the Multidisciplinary Tumor Board reflect the opinions of the specialists present at the conference without having examined the patient.  Ultimately, treatment and diagnostic decisions  rest with the primary provider(s) and the patient.

## 2021-11-24 NOTE — Telephone Encounter (Signed)
-----   Message from Earlie Server, MD sent at 11/24/2021  1:09 PM EDT ----- Please schedule him to get Lab cbc cmp NP +/- IVF in 1 week 3 weeks, lab MD +/- IVF.

## 2021-11-24 NOTE — Telephone Encounter (Signed)
Please schedule as recommended by MD and inform pt of appts.

## 2021-11-25 ENCOUNTER — Encounter: Payer: Self-pay | Admitting: Oncology

## 2021-12-02 ENCOUNTER — Inpatient Hospital Stay: Payer: 59 | Admitting: Licensed Clinical Social Worker

## 2021-12-02 ENCOUNTER — Inpatient Hospital Stay: Payer: 59

## 2021-12-02 ENCOUNTER — Inpatient Hospital Stay (HOSPITAL_BASED_OUTPATIENT_CLINIC_OR_DEPARTMENT_OTHER): Payer: 59 | Admitting: Medical Oncology

## 2021-12-02 VITALS — BP 109/72 | HR 88 | Temp 96.0°F | Wt 125.0 lb

## 2021-12-02 DIAGNOSIS — R634 Abnormal weight loss: Secondary | ICD-10-CM | POA: Diagnosis not present

## 2021-12-02 DIAGNOSIS — C3491 Malignant neoplasm of unspecified part of right bronchus or lung: Secondary | ICD-10-CM

## 2021-12-02 DIAGNOSIS — C3411 Malignant neoplasm of upper lobe, right bronchus or lung: Secondary | ICD-10-CM | POA: Diagnosis not present

## 2021-12-02 DIAGNOSIS — D6481 Anemia due to antineoplastic chemotherapy: Secondary | ICD-10-CM | POA: Diagnosis not present

## 2021-12-02 DIAGNOSIS — Z86718 Personal history of other venous thrombosis and embolism: Secondary | ICD-10-CM

## 2021-12-02 DIAGNOSIS — E860 Dehydration: Secondary | ICD-10-CM

## 2021-12-02 DIAGNOSIS — T451X5A Adverse effect of antineoplastic and immunosuppressive drugs, initial encounter: Secondary | ICD-10-CM

## 2021-12-02 DIAGNOSIS — Z95828 Presence of other vascular implants and grafts: Secondary | ICD-10-CM

## 2021-12-02 LAB — COMPREHENSIVE METABOLIC PANEL
ALT: 7 U/L (ref 0–44)
AST: 16 U/L (ref 15–41)
Albumin: 3.2 g/dL — ABNORMAL LOW (ref 3.5–5.0)
Alkaline Phosphatase: 115 U/L (ref 38–126)
Anion gap: 12 (ref 5–15)
BUN: 10 mg/dL (ref 6–20)
CO2: 22 mmol/L (ref 22–32)
Calcium: 8.5 mg/dL — ABNORMAL LOW (ref 8.9–10.3)
Chloride: 103 mmol/L (ref 98–111)
Creatinine, Ser: 0.95 mg/dL (ref 0.61–1.24)
GFR, Estimated: >60 mL/min (ref >60)
Glucose, Bld: 84 mg/dL (ref 70–99)
Potassium: 4 mmol/L (ref 3.5–5.1)
Sodium: 137 mmol/L (ref 135–145)
Total Bilirubin: 0.5 mg/dL (ref 0.3–1.2)
Total Protein: 7.3 g/dL (ref 6.5–8.1)

## 2021-12-02 LAB — CBC WITH DIFFERENTIAL/PLATELET
Abs Immature Granulocytes: 0.07 10*3/uL (ref 0.00–0.07)
Basophils Absolute: 0.1 10*3/uL (ref 0.0–0.1)
Basophils Relative: 1 %
Eosinophils Absolute: 0.5 10*3/uL (ref 0.0–0.5)
Eosinophils Relative: 4 %
HCT: 34.8 % — ABNORMAL LOW (ref 39.0–52.0)
Hemoglobin: 11.5 g/dL — ABNORMAL LOW (ref 13.0–17.0)
Immature Granulocytes: 1 %
Lymphocytes Relative: 20 %
Lymphs Abs: 2.4 10*3/uL (ref 0.7–4.0)
MCH: 34.5 pg — ABNORMAL HIGH (ref 26.0–34.0)
MCHC: 33 g/dL (ref 30.0–36.0)
MCV: 104.5 fL — ABNORMAL HIGH (ref 80.0–100.0)
Monocytes Absolute: 0.7 10*3/uL (ref 0.1–1.0)
Monocytes Relative: 6 %
Neutro Abs: 8.2 10*3/uL — ABNORMAL HIGH (ref 1.7–7.7)
Neutrophils Relative %: 68 %
Platelets: 274 10*3/uL (ref 150–400)
RBC: 3.33 MIL/uL — ABNORMAL LOW (ref 4.22–5.81)
RDW: 16.3 % — ABNORMAL HIGH (ref 11.5–15.5)
WBC: 12 10*3/uL — ABNORMAL HIGH (ref 4.0–10.5)
nRBC: 0.3 % — ABNORMAL HIGH (ref 0.0–0.2)

## 2021-12-02 MED ORDER — HEPARIN SOD (PORK) LOCK FLUSH 100 UNIT/ML IV SOLN
500.0000 [IU] | Freq: Once | INTRAVENOUS | Status: AC
  Administered 2021-12-02: 500 [IU] via INTRAVENOUS
  Filled 2021-12-02: qty 5

## 2021-12-02 MED ORDER — SODIUM CHLORIDE 0.9 % IV SOLN
INTRAVENOUS | Status: AC
  Filled 2021-12-02: qty 250

## 2021-12-02 MED ORDER — SODIUM CHLORIDE 0.9% FLUSH
10.0000 mL | Freq: Once | INTRAVENOUS | Status: AC
  Administered 2021-12-02: 10 mL via INTRAVENOUS
  Filled 2021-12-02: qty 10

## 2021-12-02 NOTE — Progress Notes (Signed)
[facility] CSW Progress Note  [facility] representative met with caregiver to discuss care giving concerns.  CSW and patient's caregiver Ms. Kluever spoke but the communication was cut off, due to connectivity issues. CSW attempted to contact Ms. Mom via [place] but was unsuccessful.  CSW called Ms. Lovena Le, who stated she was unable to respond because the patient fell at home.  Ms. Canner stated the patient is okay and has an appointment at the cancer center this afternoon and she will bring him in earlier to speak with the physician about the fall. Ms. Pavon agreed to reschedule for next week.    Adelene Amas, LCSW

## 2021-12-02 NOTE — Progress Notes (Signed)
Hematology/Oncology Progress note [phone] [phone]      Patient Care Team: Tracie Harrier, MD as PCP - General (Internal Medicine) Earlie Server, MD as Consulting Physician (Hematology and Oncology)  REASON FOR VISIT:  Follow up for treatment of squamous lung cancer.   HISTORY OF PRESENTING ILLNESS:  # Dec 2019 Stage I squamous lung cancer #s/p Bronchoscopy on 06/25/2018. subcarina EBUS FNA was non diagnostic, hypocellular specimen.  # 08/13/2018 CT guided right upper lobe biopsy pathology showed dense fibrosis and mixed inflammatory cells with prominent polytypic plasma cells component. Focal benign bronchial wall and alveolar spaces. No malignancy was identified.   # 08/13/2018 underwent right thoracotomy and wedge resection of a right upper lobe mass.  Frozen section was consistent with an inflammatory process.  On the second postop day, preliminary pathology reports high-grade malignancy.  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  08/20/2018 Patient was therefore taken back to the OR and underwent take complete lobectomy  pT1b pN0 cM0 stage I squamous cell lung cancer.  Margin is negative.  Recommend observation.    # 12/03/2018 CT chest w contrast showed local recurrence.  12/10/2018 PET showed No evidence of distant metastatic disease # 12/26/2018 s/p bronchoscopy biopsy. Confirmed local recurrence of squamous cell lung cancer.  medi port placed by Dr.Oaks.  # 06/14/2020 CT chest w contrast was reviewed and discussed with patient.  Evidence of progressive lymphangitic spread of tumor throughout the right lung, with contralateral lung nodules with  right pleural effusion  MRI brain is negative for CNS involvement.  CT abdomen with contrast showed no evidence of abdominal metastatic disease PET scan was not approved by insurance-peer to peer appeal with Dr.Vipul Bhanderi   # NGS - KRAS V14L,  TMB 26.6-high, MS stable, PD-L1 5% CDKN2C G48,  DICER1c.2117-1G>T, FLT4 G1131S, NF1 A490f, NF1 L2643*, STK11 N1821fCancer treatment Started concurrent chemoradiation on 01/16/2019 Carboplatin AUC of 2 and Taxol 45 mg/m2 weekly finished in 03/05/2019 04/10/2019, patient started on durvalumab maintenance. -CT showed progression.  Insurance denied PET scan evaluation. 07/16/2020-09/28/2020 4 cycles of carboplatin/paclitaxel/Keytruda  10/15/2020, CT chest abdomen pelvis redemonstrated postoperative and postradiation appearance of the right chest with perihilar consolidation and fibrosis.  Slight interval decrease in size of the pulmonary nodules associated with interlobular septal thickening nearly complete resolution of previously seen left-sided pulmonary nodules.  Minimal irregular residual.  Right pleural effusion improved.  Consistent with treatment response.  No evidence of metastatic disease with the abdomen or pelvis.  None obstructive bilateral nephrolithiasis - partial response to the treatment- 10/19/2020, Keytruda monotherapy maintenance  12/15/2020, CT chest without contrast showed resolution of lung nodules, no evidence of recurrent disease 12/30/2020, patient was admitted due to unprovoked extensive deep vein thrombosis from calf vein to the common femoral vein on the left.  Patient was started on heparin drip.  Patient underwent thrombolysis and is a colectomy on 12/31/2020.  Anticoagulation was switched to Eliquis at discharge.  04/22/2021, CT chest with contrast showed none occlusive pulmonary embolism, Interval progression of consolidation airspace opacity in the medial right middle lobe with bandlike opacity and bronchiectasis in the perihilar right lung,-this is compatible with evolving posttreatment changes although recurrent disease anteriorly is not excluded. Interval development of numerous bilateral pulmonary nodules, measuring up to 11 mm in the right lung base.  Concerning for metastatic disease.  Tiny right pleural effusion.    04/26/2021, resumed on carboplatin/Taxol/Keytruda. Patient did not tolerate his chemotherapy of carboplatin/Taxol/Keytruda on 04/26/2021. Patient has developed severe anemia status post  PRBC transfusion. 05/25/2021, carboplatin AUC 5 with Keytruda 06/23/2021  continued on carboplatin AUC 5 with Keytruda 07/07/2021 carboplatin with [name]   07/15/2021 CT chest with contrast showed enlarged right lower lobe thick-walled cavitary lesion in the anterior aspect of the right lower lobe.  Concerning for progression.  Multiple other small pulmonary nodules are also noted elsewhere in the lungs, many of which are stable compared to prior studies.  Several are new or clearly enlarged.  The best example is an enlarging pulmonary nodule in the left upper lobe, 8 x 6 mm comparing to 4 x 3 mm on 04/22/2021.  #January 2023 patient was hospitalized due to hyperkalemia. Patient was given calcium gluconate, nebulized albuterol and IV Lasix.  He was observed overnight. potassium improved and patient was discharged.  He has stopped taking potassium supplementation.  08/03/2021 - 09/14/2021, docetaxel/Cyramza with G-CSF support x 3  10/05/2021, C. difficile colitis, treated with Dificid 200 mg twice daily for 10 days.  10/26/2021, cycle 4 docetaxel and Cyramza.  INTERVAL HISTORY Samuel Choi is a 56 y.o. male who has above history reviewed by me today presents for reatment of  recurrent  squamous cell carcinoma.   Feeling fatigued today and recently. Chronic but not worsened SOB. Patient denies fever, chills, diarrhea, abdominal pain, dysuria symptoms. Declines supplements. Trying to gain weight through diet. Down 5 pounds since last visit on 11/18/2021. At his last visit a PET scan was ordered and he was referred to Dr. Patsey Berthold in Pulmonology as well as surgery for extensive colonic diverticulosis. At this time his chemotherapy is being held due to C. Diff infection. He is following up today for labs and fluids  and again returns on June 6th for labs, fluids and to see Dr. Tasia Catchings.      Review of Systems  Constitutional:  Positive for fatigue. Negative for appetite change, chills and fever.  HENT:   Negative for hearing loss and voice change.   Eyes:  Negative for eye problems and icterus.  Respiratory:  Positive for shortness of breath. Negative for chest tightness and cough.   Cardiovascular:  Negative for chest pain and leg swelling.  Gastrointestinal:  Negative for abdominal distention, abdominal pain, nausea and vomiting.  Endocrine: Negative for hot flashes.  Genitourinary:  Negative for difficulty urinating, dysuria and frequency.   Musculoskeletal:  Negative for arthralgias.  Skin:  Negative for itching and rash.  Neurological:  Negative for light-headedness and numbness.  Hematological:  Negative for adenopathy. Does not bruise/bleed easily.  Psychiatric/Behavioral:  Negative for confusion.    MEDICAL HISTORY:  Past Medical History:  Diagnosis Date   Alcohol abuse    usually drinks 2-3 drinks per day   Atherosclerosis 06/2018   Chronic sinusitis    Dehydration 02/07/2019   Emphysema of lung ([place]) 06/2018   patient unaware of this.   Hip fracture ([place]) 05/2018   no surgery   History of kidney stones 05/2018   per xray, bilateral nephrolitiasis   Hypertension    Squamous cell carcinoma of lung, right ([place]) 06/2018    SURGICAL HISTORY: Past Surgical History:  Procedure Laterality Date   BRAIN SURGERY  10/2017   nasal/sinus endoscopy. mass benign   ELECTROMAGNETIC NAVIGATION BROCHOSCOPY Right 06/25/2018   Procedure: ELECTROMAGNETIC NAVIGATION BRONCHOSCOPY;  Surgeon: [name], MD;  Location: [facility];  Service: Cardiopulmonary;  Laterality: Right;   ENDOBRONCHIAL ULTRASOUND Right 06/25/2018   Procedure: ENDOBRONCHIAL ULTRASOUND;  Surgeon: [name], MD;  Location: [facility];  Service: Cardiopulmonary;  Laterality: Right;   ENDOBRONCHIAL ULTRASOUND Right 12/26/2018    Procedure: ENDOBRONCHIAL ULTRASOUND RIGHT;  Surgeon: [name], MD;  Location: [facility];  Service: Cardiopulmonary;  Laterality: Right;   FLEXIBLE BRONCHOSCOPY N/A 08/20/2018   Procedure: FLEXIBLE BRONCHOSCOPY PREOP;  Surgeon: [name], MD;  Location: [facility];  Service: Thoracic;  Laterality: N/A;   IR CV LINE INJECTION  04/04/2019   NASAL SINUS SURGERY  10/2017   At [facility], frontal sinusotomy, ethmoidectomy, resection anterior cranial fossa neoplasm, turbinate resection   PERIPHERAL VASCULAR THROMBECTOMY Left 12/31/2020   Procedure: PERIPHERAL VASCULAR THROMBECTOMY;  Surgeon: Algernon Huxley, MD;  Location: [facility];  Service: Cardiovascular;  Laterality: Left;   PORTACATH PLACEMENT Left 01/15/2019   Procedure: INSERTION PORT-A-CATH;  Surgeon: [name], MD;  Location: [facility];  Service: General;  Laterality: Left;   THORACOTOMY Right 08/13/2018   Procedure: PREOP BROCHOSCOPY WITH RIGHT THORACOTOMY AND RUL RESECTION;  Surgeon: [name], MD;  Location: [facility];  Service: General;  Laterality: Right;   THORACOTOMY Right 08/20/2018   Procedure: THORACOTOMY MAJOR RIGHT UPPER LOBE LOBECTOMY;  Surgeon: [name], MD;  Location: [facility];  Service: Thoracic;  Laterality: Right;   TOE SURGERY Left    pin in left toe    SOCIAL HISTORY: Social History   Socioeconomic History   Marital status: Married    Spouse name: lisa   Number of children: Not on file   Years of education: Not on file   Highest education level: Not on file  Occupational History   Occupation: welding    Comment: taking time off to resolve issues  Tobacco Use   Smoking status: Former    Packs/day: 0.50    Years: 15.00    Pack years: 7.50    Types: Cigarettes    Quit date: 08/2018    Years since quitting: 3.3   Smokeless tobacco: Never  Vaping Use   Vaping Use: Never used  Substance and Sexual Activity   Alcohol use: Yes    Alcohol/week: 3.0 standard drinks    Types: 3 Cans of beer per week     Comment: usually 2 drinks per week, per patient   Drug use: No   Sexual activity: Not on file  Other Topics Concern   Not on file  Social History Narrative   Not on file   Social Determinants of Health   Financial Resource Strain: Not on file  Food Insecurity: Not on file  Transportation Needs: Not on file  Physical Activity: Not on file  Stress: Not on file  Social Connections: Not on file  Intimate Partner Violence: Not on file    FAMILY HISTORY: Family History  Problem Relation Age of Onset   Breast cancer Mother    Diabetes Mother    Lung cancer Father    Hypertension Father     ALLERGIES:  has No Known Allergies.  MEDICATIONS:  Current Outpatient Medications  Medication Sig Dispense Refill   albuterol (VENTOLIN HFA) 108 (90 Base) MCG/ACT inhaler INHALE 2 PUFFS BY MOUTH EVERY 6 HOURS AS NEEDED FOR WHEEZE OR SHORTNESS OF BREATH 8.5 g 5   apixaban (ELIQUIS) 2.5 MG TABS tablet Take 1 tablet (2.5 mg total) by mouth 2 (two) times daily. 60 tablet 6   budesonide-formoterol (SYMBICORT) 80-4.5 MCG/ACT inhaler Inhale 2 puffs into the lungs 2 (two) times daily. in the morning and at bedtime. 10.2 each 12   dexamethasone (DECADRON) 4 MG tablet Take 2 tablets (8 mg total) by  mouth 2 (two) times daily. Start the day before Taxotere. Then daily after chemo for 2 days. 30 tablet 1   diltiazem (CARDIZEM CD) 120 MG 24 hr capsule Take 120 mg by mouth daily.     fidaxomicin (DIFICID) 200 MG TABS tablet Take 1 tablet (200 mg total) by mouth 2 (two) times daily. 20 tablet 0   folic acid (V-R FOLIC ACID) 093 MCG tablet Take 1 tablet (400 mcg total) by mouth daily. 90 tablet 1   HYDROcodone-acetaminophen (NORCO/VICODIN) 5-325 MG tablet Take 1 tablet by mouth every 8 (eight) hours as needed for severe pain. 30 tablet 0   latanoprost (XALATAN) 0.005 % ophthalmic solution SMARTSIG:In Eye(s)     loperamide (IMODIUM) 2 MG capsule Take 1 capsule (2 mg total) by mouth See admin instructions.  Initial: 4 mg, followed by 2 mg after each loose stool; maximum: 16 mg/day 60 capsule 0   loratadine (CLARITIN) 10 MG tablet Take 10 mg by mouth daily.     methocarbamol (ROBAXIN) 500 MG tablet Take 1 tablet (500 mg total) by mouth at bedtime. 30 tablet 0   metoprolol succinate (TOPROL-XL) 25 MG 24 hr tablet Take 1 tablet (25 mg total) by mouth daily. 30 tablet 0   nystatin (MYCOSTATIN) 100000 UNIT/ML suspension Take 5 mLs (500,000 Units total) by mouth 4 (four) times daily. Swish and swallow. 473 mL 1   olmesartan (BENICAR) 40 MG tablet Take 40 mg by mouth every other day.      ondansetron (ZOFRAN) 8 MG tablet Take 1 tablet (8 mg total) by mouth every 8 (eight) hours as needed for refractory nausea / vomiting. Start on day 3 after chemo. 90 tablet 1   pantoprazole (PROTONIX) 20 MG tablet Take 1 tablet (20 mg total) by mouth daily. 60 tablet 2   promethazine (PHENERGAN) 25 MG tablet Take 1 tablet (25 mg total) by mouth every 6 (six) hours as needed for nausea or vomiting. 30 tablet 0   SPIRIVA HANDIHALER 18 MCG inhalation capsule INHALE 1 CAPSULE VIA HANDIHALER ONCE DAILY AT THE SAME TIME EVERY DAY 30 capsule 0   vitamin B-12 (CYANOCOBALAMIN) 1000 MCG tablet Take 1 tablet (1,000 mcg total) by mouth daily. 90 tablet 1   No current facility-administered medications for this visit.   Facility-Administered Medications Ordered in Other Visits  Medication Dose Route Frequency Provider Last Rate Last Admin   0.9 %  sodium chloride infusion   Intravenous Continuous Ranjit Ashurst M, PA-C       dexamethasone (DECADRON) 10 MG/ML injection            heparin lock flush 100 unit/mL  500 Units Intravenous Once Earlie Server, MD       heparin lock flush 100 unit/mL  500 Units Intravenous Once Earlie Server, MD       heparin lock flush 100 unit/mL  500 Units Intravenous Once Aylyn Wenzler M, PA-C       sodium chloride flush (NS) 0.9 % injection 10 mL  10 mL Intravenous PRN Earlie Server, MD   10 mL at 04/04/19 0903    sodium chloride flush (NS) 0.9 % injection 10 mL  10 mL Intravenous PRN Earlie Server, MD   10 mL at 07/26/20 1308   sodium chloride flush (NS) 0.9 % injection 10 mL  10 mL Intravenous PRN Earlie Server, MD   10 mL at 07/28/21 0828   sodium chloride flush (NS) 0.9 % injection 10 mL  10 mL Intravenous Once Tkeya Stencil M,  PA-C         PHYSICAL EXAMINATION: ECOG PERFORMANCE STATUS: 1 - Symptomatic but completely ambulatory Vitals:   12/02/21 1347  BP: 109/72  Pulse: 88  Temp: (!) 96 F (35.6 C)  SpO2: 92%   Filed Weights   12/02/21 1347  Weight: 125 lb (56.7 kg)    Physical Exam Constitutional:      General: He is not in acute distress. HENT:     Head: Normocephalic and atraumatic.     Mouth/Throat:     Comments:   Eyes:     General: No scleral icterus. Cardiovascular:     Rate and Rhythm: Normal rate and regular rhythm.     Heart sounds: Normal heart sounds.  Pulmonary:     Effort: Pulmonary effort is normal. No respiratory distress.     Breath sounds: No wheezing.     Comments: Decreased breath sound bilaterally Abdominal:     General: Bowel sounds are normal. There is no distension.     Palpations: Abdomen is soft.  Musculoskeletal:        General: No deformity. Normal range of motion.     Cervical back: Normal range of motion and neck supple.  Skin:    General: Skin is warm and dry.     Findings: No erythema or rash.  Neurological:     Mental Status: He is alert and oriented to person, place, and time. Mental status is at baseline.     Cranial Nerves: No cranial nerve deficit.     Coordination: Coordination normal.  Psychiatric:        Mood and Affect: Mood normal.     LABORATORY DATA:  I have reviewed the data as listed Lab Results  Component Value Date   WBC 12.0 (H) 12/02/2021   HGB 11.5 (L) 12/02/2021   HCT 34.8 (L) 12/02/2021   MCV 104.5 (H) 12/02/2021   PLT 274 12/02/2021   Recent Labs    11/09/21 0823 11/18/21 1030 12/02/21 1345  NA 132* 136  137  K 4.0 3.8 4.0  CL 97* 102 103  CO2 22 21* 22  GLUCOSE 141* 80 84  BUN _0 CREATININE 0.96 0.78 0.95  CALCIUM 9.0 8.2* 8.5*  GFRNONAA >60 >60 >60  PROT 7.0 6.7 7.3  ALBUMIN 3.0* 2.8* 3.2*  AST 25 14* 16  ALT _1 ALKPHOS 105 101 115  BILITOT 0.5 0.4 0.5   Iron/TIBC/Ferritin/ %Sat No results found for: IRON, TIBC, FERRITIN, IRONPCTSAT   RADIOGRAPHIC STUDIES: I have personally reviewed the radiological images as listed and agreed with the findings in the report. CT CHEST ABDOMEN PELVIS W CONTRAST  Result Date: 11/16/2021 CLINICAL DATA:  56 year old male with history of lung cancer status post surgical resection, chemotherapy and radiation therapy. Restaging examination. * Tracking Code: BO * EXAM: CT CHEST, ABDOMEN, AND PELVIS WITH CONTRAST TECHNIQUE: Multidetector CT imaging of the chest, abdomen and pelvis was performed following the standard protocol during bolus administration of intravenous contrast. RADIATION DOSE REDUCTION: This exam was performed according to the departmental dose-optimization program which includes automated exposure control, adjustment of the mA and/or kV according to patient size and/or use of iterative reconstruction technique. CONTRAST:  165m OMNIPAQUE IOHEXOL 300 MG/ML  SOLN COMPARISON:  Multiple priors, most recently chest CT 07/15/2021. CT the chest, abdomen and pelvis 10/15/2020. FINDINGS: CT CHEST FINDINGS Cardiovascular: Heart size is borderline enlarged with prominence of the right ventricle. There is no significant pericardial fluid, thickening or  pericardial calcification. There is aortic atherosclerosis, as well as atherosclerosis of the great vessels of the mediastinum and the coronary arteries, including calcified atherosclerotic plaque in the left circumflex coronary artery. Mediastinum/Nodes: No pathologically enlarged mediastinal or hilar lymph nodes. Esophagus is unremarkable in appearance. No axillary lymphadenopathy. Lungs/Pleura:  Status post right upper lobectomy. Compensatory hyperexpansion of the right middle and lower lobes. Previously noted thick-walled cavitary lesion in the right lower lobe is less thick-walled on today's examination, and now is more of a simple appearing cyst (best appreciated on axial image 94 of series 6). However, there is a new irregular-shaped thick-walled cavitary lesion adjacent to this in the right lower lobe (axial image 87 of series 6) which currently measures 2.4 x 2.0 cm. 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 A few scattered pulmonary nodules are noted elsewhere in the lungs, most notably in the left upper lobe (axial image 40 of series 6 and coronal image 50 of series 4) where there is an ill-defined part solid and cystic lesion measuring 11 x 5 x 10 mm (previously only 8 x 6 x 5 mm). Some clustered nodular architectural distortion is also noted in the base of the right lung, similar to the prior study, favored to reflect areas of chronic post infectious or inflammatory scarring. Trace right pleural effusion is chronic. No left pleural effusion. Mild diffuse bronchial wall thickening with mild centrilobular and paraseptal emphysema. Musculoskeletal: There are no aggressive appearing lytic or blastic lesions noted in the visualized portions of the skeleton. CT ABDOMEN PELVIS FINDINGS Hepatobiliary: No suspicious cystic or solid hepatic lesions. No intra or extrahepatic biliary ductal dilatation. Gallbladder is normal in appearance. Pancreas: No pancreatic mass. No pancreatic ductal dilatation. No pancreatic or peripancreatic fluid collections or inflammatory changes. Spleen: Unremarkable. Adrenals/Urinary Tract: Subcentimeter low-attenuation lesions in  the lower pole of the left kidney, too small to characterize, but statistically likely small cysts (no imaging follow-up is recommended). No other suspicious renal lesions. No hydroureteronephrosis. Bilateral adrenal glands are normal in appearance. Along the superior aspect of the urinary bladder wall (slightly to the left of midline) there is a 2.6 x 1.4 x 1.7 cm rim enhancing centrally low-attenuation structure which contains a combination of fluid and gas which appears to be intramural within the bladder wall, but is also intimately associated with the adjacent sigmoid colon which has numerous colonic diverticulae are. No definite gas within the lumen of the urinary bladder. Stomach/Bowel: The appearance of the stomach is normal. No pathologic dilatation of small bowel or colon. Numerous colonic diverticulae are noted, particularly in the sigmoid colon where there are 2 low-attenuation rim enhancing structures compatible with diverticular abscesses, 1 of which appears intimately associated with the adjacent urinary bladder wall where this appears to be intramural within the wall of the bladder (discussed above). An adjacent lesion is noted along the left pelvic sidewall in the mid sigmoid colon (axial image 101 of series 2 and coronal image 69 of series 4) measuring 2.3 x 1.6 x 2.2 cm. Another large gas, fluid and rim enhancing structure extends off the superior aspect of the mid sigmoid colon (axial image 93 of series 2 and coronal image 54 of series 4). Other smaller potential diverticular abscess also noted on axial image 97 of series 2 measuring 1.6 x 1.2 cm. There  does not appear to be extensive inflammation in the pericolonic fat at this time. Vascular/Lymphatic: Aortic atherosclerosis, without evidence of aneurysm or dissection in the abdominal or pelvic vasculature. No lymphadenopathy noted in the abdomen or pelvis. Reproductive: Prostate gland and seminal vesicles are unremarkable in appearance. Other:  No significant volume of ascites.  No pneumoperitoneum. Musculoskeletal: 8 mm of anterolisthesis of L5 upon S1. There are no aggressive appearing lytic or blastic lesions noted in the visualized portions of the skeleton. IMPRESSION: 1. The previously noted thick-walled cavity of concern in the right lower lobe now appears more simple and thin-walled in appearance, presumably a post infectious cavity. There is a new adjacent thick-walled cavity in the right lower lobe which is aggressive in appearance, but given the evolution of the previous lesion, this may also be of infectious etiology. 2. However, there is an enlarging mixed cystic and solid nodule in the left upper lobe which is concerning for metastatic lesion or new primary lesion. This currently measures 11 x 5 x 10 mm. Close attention on follow-up studies is recommended. 3. No definite signs of metastatic disease to the abdomen or pelvis. 4. Extensive colonic diverticulosis most involved in the sigmoid colon where there appear to be several small diverticular abscesses. 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 Notably, one of these appears to fistulized into the wall of the urinary bladder, without frank fistulization to the lumen of the urinary bladder at this time. Further clinical evaluation, and nonemergent urology consultation in the near future is suggested for future management. 5. Mild diffuse bronchial wall thickening with mild centrilobular and paraseptal emphysema; imaging findings suggestive of underlying COPD. 6. Trace right pleural effusion (chronic and unchanged). 7. Aortic atherosclerosis, in addition to left circumflex coronary artery disease. Please note that although the presence of coronary artery calcium documents the presence of coronary artery disease, the severity of this disease and any potential stenosis cannot be assessed on this non-gated CT examination. Assessment  for potential risk factor modification, dietary therapy or pharmacologic therapy may be warranted, if clinically indicated. 8. Additional incidental findings, as above. Electronically Signed   By: Vinnie Langton M.D.   On: 11/16/2021 08:55   NM PET Image Restag (PS) Skull Base To Thigh  Result Date: 11/23/2021 CLINICAL DATA:  Subsequent treatment strategy for squamous cell lung cancer. Recent chemotherapy. Prior radiation. EXAM: NUCLEAR MEDICINE PET SKULL BASE TO THIGH TECHNIQUE: 7.1 mCi F-18 FDG was injected intravenously. Full-ring PET imaging was performed from the skull base to thigh after the radiotracer. CT data was obtained and used for attenuation correction and anatomic localization. Fasting blood glucose: 110 mg/dl COMPARISON:  CT chest abdomen pelvis dated 11/15/2021 FINDINGS: Mediastinal blood pool activity: SUV max 1.4 Liver activity: SUV max NA NECK: No hypermetabolic cervical lymphadenopathy. Stable 9 mm lesion deep to the left parotid (series 2/image 31), max SUV 4.1, previously 4.6. Incidental CT findings: none CHEST: Status post right upper lobectomy. Radiation changes in the right hemithorax. 9 mm nodular opacity in the central left upper lobe (series 2/image 32), max SUV 1.7. This is enlarging on CT, raising concern for primary bronchogenic neoplasm or metastasis. 1.9 cm thick-walled cavitary lesion in the lateral right lower lobe (series 2/image 100), max SUV 4.5, but new from January 2023. Adjacent 3.3 cm thin-walled cavitary lesion in the anteromedial right lower lobe (series 2/image 105) is improved from January 2023 and non FDG avid. As such, infectious/inflammation is favored. No  hypermetabolic thoracic lymphadenopathy. Incidental CT findings: Better evaluated on recent dedicated CT chest. Mosaic attenuation in the lungs bilaterally. ABDOMEN/PELVIS: No abnormal hypermetabolism in the liver, spleen, pancreas, or adrenal glands. No hypermetabolic abdominopelvic lymphadenopathy.  Multifocal hypermetabolism adjacent to the sigmoid colon, corresponding to the chronic diverticular abscesses noted on recent CT. Representative max SUV 5.6. Incidental CT findings: Better evaluated on recent dedicated CT chest. Vascular calcifications. Thick-walled bladder. Sigmoid diverticulosis. Prominent rectal stool burden. SKELETON: No focal hypermetabolic activity to suggest skeletal metastasis. Incidental CT findings: none IMPRESSION: 9 mm nodular opacity in the central left upper lobe demonstrates mild hypermetabolism and is enlarging on CT, raising concern for primary bronchogenic neoplasm or metastasis. 1.9 cm thick-walled cavitary lesion in the lateral right lower lobe is hypermetabolic but new from January 2023, favoring infection/inflammation. Adjacent 3.3 cm thin-walled cavitary lesion in the right lower lobe is improved and non FDG avid. Attention on follow-up is suggested. Status post right upper lobectomy with radiation changes in the right hemithorax. 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 Electronically Signed   By: Julian Hy M.D.   On: 11/23/2021 00:09      ASSESSMENT & PLAN:  1. Squamous cell carcinoma of right lung ([place])   2. Antineoplastic chemotherapy induced anemia   3. Dehydration   4. Weight loss   5. History of thrombosis    #Recurrent Squamous cell carcinoma of lung, stage IV 07/16/2020-09/28/2020 carboplatin Taxol and Keytruda x 1  partial response -Keytruda maintenance.-04/22/2021, progression-resumed on carboplatin/Taxol/Keytruda - did not tolerate due to myelosuppression.-->  Taxol held, carboplatin/Keytruda--> CT 07/2020 progression --> 08/03/2021 docetaxel and ramucirumab. Status post 4 cycles of docetaxel and ramucirumab.-->   11/16/2021 CT showed previously noted thick-walled cavity of concern in the right lower lobe appears more more simple and thin-walled, new adjacent thick-walled cavity in the right lower lobe  ?  Infectious etiology versus mixed response/disease progression.  Chronic in nature. Has seen Pulmonology. Returns to see Dr. Tasia Catchings on June 6th. Chemotherapy on hold for now. IVF today. Feels better after fluids.   #Chemotherapy-induced anemia  Chronic and improved. We will continue to monitor.    #Weight loss:  Acute on chronic. Continue to want to avoid supplements or referral. Will monitor.   #Shortness of breath with exertion.  Combination of COPD/emphysema/lung cancer/symptomatic anemia  Chronic and stable. Continue albuterol as needed, Symbicort.   #Unprovoked left lower extremity DVT, status post thrombolysis/thrombectomy, Nonocclusive PE noted on  CT.Patient has completed 6+ months of therapeutic dose of Eliquis.  Continue Eliquis 2.5 mg p.o. twice daily  #Chronic hypomagnesia.  Continue slow magnesium twice daily.    #Extensive colonic diverticulosis most involved in sigmoid colon. ?  Small diverticula abscesses. 1 of these appears to fistulous into the wall of the urinary bladder, without flank fistulization to the lumen of the urinary bladder at this time.  Patient denies any urinary symptoms at this point.  I discussed the case with his urologist Dr. Erlene Quan.  If he remains asymptomatic, Dr. Erlene Quan recommends repeat imaging in the future, and also general surgeon evaluation.   Today he reports no symptoms related to this. Will continue to monitor. Encouraged surgical consultation which has not been scheduled yet per pt.   Follow-up 1L IVF today Keep follow up on June 6th with Dr. Tasia Catchings Encouraged him to make his surgical consult visit.   Holli [employer] PA-C   12/02/2021

## 2021-12-16 ENCOUNTER — Inpatient Hospital Stay: Payer: 59 | Attending: Oncology | Admitting: Oncology

## 2021-12-16 ENCOUNTER — Telehealth: Payer: Self-pay

## 2021-12-16 ENCOUNTER — Inpatient Hospital Stay: Payer: 59

## 2021-12-16 VITALS — BP 115/78 | HR 50 | Temp 97.7°F | Resp 18

## 2021-12-16 DIAGNOSIS — Z5112 Encounter for antineoplastic immunotherapy: Secondary | ICD-10-CM | POA: Diagnosis present

## 2021-12-16 DIAGNOSIS — K5730 Diverticulosis of large intestine without perforation or abscess without bleeding: Secondary | ICD-10-CM

## 2021-12-16 DIAGNOSIS — C3411 Malignant neoplasm of upper lobe, right bronchus or lung: Secondary | ICD-10-CM | POA: Insufficient documentation

## 2021-12-16 DIAGNOSIS — E860 Dehydration: Secondary | ICD-10-CM

## 2021-12-16 DIAGNOSIS — C3491 Malignant neoplasm of unspecified part of right bronchus or lung: Secondary | ICD-10-CM | POA: Diagnosis not present

## 2021-12-16 DIAGNOSIS — Z87891 Personal history of nicotine dependence: Secondary | ICD-10-CM | POA: Diagnosis not present

## 2021-12-16 DIAGNOSIS — E876 Hypokalemia: Secondary | ICD-10-CM

## 2021-12-16 DIAGNOSIS — Z9221 Personal history of antineoplastic chemotherapy: Secondary | ICD-10-CM | POA: Diagnosis not present

## 2021-12-16 DIAGNOSIS — Z923 Personal history of irradiation: Secondary | ICD-10-CM | POA: Insufficient documentation

## 2021-12-16 DIAGNOSIS — Z5111 Encounter for antineoplastic chemotherapy: Secondary | ICD-10-CM | POA: Insufficient documentation

## 2021-12-16 DIAGNOSIS — Z86718 Personal history of other venous thrombosis and embolism: Secondary | ICD-10-CM | POA: Diagnosis not present

## 2021-12-16 DIAGNOSIS — E43 Unspecified severe protein-calorie malnutrition: Secondary | ICD-10-CM

## 2021-12-16 DIAGNOSIS — Z79899 Other long term (current) drug therapy: Secondary | ICD-10-CM | POA: Diagnosis not present

## 2021-12-16 DIAGNOSIS — Z902 Acquired absence of lung [part of]: Secondary | ICD-10-CM | POA: Diagnosis not present

## 2021-12-16 DIAGNOSIS — Z7951 Long term (current) use of inhaled steroids: Secondary | ICD-10-CM | POA: Diagnosis not present

## 2021-12-16 DIAGNOSIS — Z5189 Encounter for other specified aftercare: Secondary | ICD-10-CM | POA: Insufficient documentation

## 2021-12-16 DIAGNOSIS — Z7901 Long term (current) use of anticoagulants: Secondary | ICD-10-CM | POA: Diagnosis not present

## 2021-12-16 DIAGNOSIS — Z95828 Presence of other vascular implants and grafts: Secondary | ICD-10-CM

## 2021-12-16 DIAGNOSIS — Z7952 Long term (current) use of systemic steroids: Secondary | ICD-10-CM | POA: Diagnosis not present

## 2021-12-16 LAB — CBC WITH DIFFERENTIAL/PLATELET
Abs Immature Granulocytes: 0.09 10*3/uL — ABNORMAL HIGH (ref 0.00–0.07)
Basophils Absolute: 0.1 10*3/uL (ref 0.0–0.1)
Basophils Relative: 1 %
Eosinophils Absolute: 0.2 10*3/uL (ref 0.0–0.5)
Eosinophils Relative: 1 %
HCT: 39.2 % (ref 39.0–52.0)
Hemoglobin: 13.2 g/dL (ref 13.0–17.0)
Immature Granulocytes: 1 %
Lymphocytes Relative: 8 %
Lymphs Abs: 1.2 10*3/uL (ref 0.7–4.0)
MCH: 36 pg — ABNORMAL HIGH (ref 26.0–34.0)
MCHC: 33.7 g/dL (ref 30.0–36.0)
MCV: 106.8 fL — ABNORMAL HIGH (ref 80.0–100.0)
Monocytes Absolute: 1 10*3/uL (ref 0.1–1.0)
Monocytes Relative: 7 %
Neutro Abs: 13.5 10*3/uL — ABNORMAL HIGH (ref 1.7–7.7)
Neutrophils Relative %: 82 %
Platelets: 165 10*3/uL (ref 150–400)
RBC: 3.67 MIL/uL — ABNORMAL LOW (ref 4.22–5.81)
RDW: 15.4 % (ref 11.5–15.5)
WBC: 16.1 10*3/uL — ABNORMAL HIGH (ref 4.0–10.5)
nRBC: 0 % (ref 0.0–0.2)

## 2021-12-16 LAB — COMPREHENSIVE METABOLIC PANEL
ALT: 8 U/L (ref 0–44)
AST: 26 U/L (ref 15–41)
Albumin: 3.2 g/dL — ABNORMAL LOW (ref 3.5–5.0)
Alkaline Phosphatase: 111 U/L (ref 38–126)
Anion gap: 13 (ref 5–15)
BUN: 10 mg/dL (ref 6–20)
CO2: 24 mmol/L (ref 22–32)
Calcium: 9.1 mg/dL (ref 8.9–10.3)
Chloride: 102 mmol/L (ref 98–111)
Creatinine, Ser: 0.94 mg/dL (ref 0.61–1.24)
GFR, Estimated: >60 mL/min (ref >60)
Glucose, Bld: 116 mg/dL — ABNORMAL HIGH (ref 70–99)
Potassium: 4.2 mmol/L (ref 3.5–5.1)
Sodium: 139 mmol/L (ref 135–145)
Total Bilirubin: 0.9 mg/dL (ref 0.3–1.2)
Total Protein: 7.2 g/dL (ref 6.5–8.1)

## 2021-12-16 MED ORDER — SODIUM CHLORIDE 0.9% FLUSH
10.0000 mL | Freq: Once | INTRAVENOUS | Status: AC
  Administered 2021-12-16: 10 mL via INTRAVENOUS
  Filled 2021-12-16: qty 10

## 2021-12-16 MED ORDER — SODIUM CHLORIDE 0.9 % IV SOLN
Freq: Once | INTRAVENOUS | Status: AC
  Filled 2021-12-16: qty 250

## 2021-12-16 MED ORDER — HEPARIN SOD (PORK) LOCK FLUSH 100 UNIT/ML IV SOLN
500.0000 [IU] | Freq: Once | INTRAVENOUS | Status: AC
  Administered 2021-12-16: 500 [IU] via INTRAVENOUS
  Filled 2021-12-16: qty 5

## 2021-12-16 MED ORDER — PREDNISONE 50 MG PO TABS: 50.0000 mg | ORAL_TABLET | Freq: Every day | ORAL | 0 refills | Status: DC

## 2021-12-16 MED ORDER — PREDNISONE 20 MG PO TABS
40.0000 mg | ORAL_TABLET | Freq: Every day | ORAL | 0 refills | Status: DC
Start: 2021-12-23 — End: 2022-01-03

## 2021-12-16 NOTE — Telephone Encounter (Signed)
LTCD forms faxed to [facility]. Will sent to chart to scan. Fax confirmation received.   Fax: [phone]

## 2021-12-17 ENCOUNTER — Encounter: Payer: Self-pay | Admitting: Oncology

## 2021-12-17 NOTE — Assessment & Plan Note (Signed)
Extensive colonic diverticulosis with chronic diverticula abscesses. One of the diverticula appears to fistulous into the wall of the urinary bladder, without flank fistulization to the lumen of the urinary bladder at this time.  Previously discussed with  urologist Dr. Erlene Quan and Surgery Dr.Cintron.  If he remains asymptomatic, recommends observation.  repeat imaging in the future

## 2021-12-17 NOTE — Assessment & Plan Note (Signed)
Chemotherapy as above planned.

## 2021-12-17 NOTE — Progress Notes (Signed)
Hematology/Oncology Progress note [phone] [phone]     Clinic Day:  12/16/2021   Referring physician: Tracie Harrier, MD  ASSESSMENT & PLAN:   Assessment & Plan: Encounter for antineoplastic chemotherapy Chemotherapy as above planned.   Squamous cell carcinoma of right lung ([place]) 07/16/2020-09/28/2020 carboplatin Taxol and Keytruda x 1  partial response -Keytruda maintenance.-04/22/2021, progression-resumed on carboplatin/Taxol/Keytruda - did not tolerate due to myelosuppression.-->  Taxol held, carboplatin/Keytruda--> CT 07/2020 progression --> 08/03/2021 started docetaxel and ramucirumab.--> Reviewed and discussed with patient.  Hold treatment  Hypokalemia Continue potassium supplementation  History of thrombosis Unprovoked left lower extremity DVT, status post thrombolysis/thrombectomy, Nonocclusive PE Patient has completed 6+ months of therapeutic dose of Eliquis.  Continue Eliquis 2.5 mg p.o. twice daily.  Hypomagnesemia Continue Slow-Mag twice daily.  Diverticulosis of colon Extensive colonic diverticulosis with chronic diverticula abscesses. One of the diverticula appears to fistulous into the wall of the urinary bladder, without flank fistulization to the lumen of the urinary bladder at this time.  Previously discussed with  urologist Dr. Erlene Quan and Surgery Dr.Cintron.  If he remains asymptomatic, recommends observation.  repeat imaging in the future  Severe protein-calorie malnutrition ([place]) patient refuses nutrition supplements.  encourage patient to follow-up with nutritionist IVF today.   Follow-up IVF 1 week.  Lab MD 2 weeks Docetaxel and ramucirumab. D4 GCSF  The patient understands the plans discussed today and is in agreement with them.  He knows to contact our office if he develops concerns prior to his next appointment.   Earlie Server, MD  12/16/2021   No orders of the defined types were placed in this encounter.     CHIEF  COMPLAINT:  CC: Follow up for lung cancer treatments.   Current Treatment:  Docetaxel Ramucizumab  HISTORY OF PRESENT ILLNESS:   Oncology History  Squamous cell carcinoma of right lung ([place])  08/13/2018 Surgery   s/p Bronchoscopy on 06/25/2018. subcarina EBUS FNA was non diagnostic, hypocellular specimen.  08/13/18 Patient underwent right thoracotomy and wedge resection of a right upper lobe mass.  Frozen section was consistent with an inflammatory process.  On the second postop day, preliminary pathology reports high-grade malignancy.  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  08/20/2018 Patient was therefore taken back to the OR and underwent take complete lobectomy  pT1b pN0 cM0 stage I squamous cell lung cancer. Margin is negative.  Recommend observation.     08/13/2018 Initial Diagnosis   Stage I Squamous cell carcinoma of lung, right ([place])   12/03/2018 Progression   12/03/2018 CT chest showed abnormal soft tissue in the right suprahilar region measuring up to 2.5 cm, worrisome for recurrence 12/10/9526 PET Hypermetabolic RIGHT suprahilar mass consistent with lung cancer, recurrence. 12/26/2018 s/p bronchoscopy biopsy. Confirmed local recurrence of squamous cell lung cancer.  medi port placed by Dr.Oaks. 01/20/19 MRI brain w/wo contrast: No evidence of metastatic disease   01/17/2019 - 03/07/2019 Chemotherapy   concurrent chemoradiation on 01/16/2019 Carboplatin AUC of 2 and Taxol 45 mg/m2 weekly finished in 03/05/2019    03/31/2019 Imaging   03/31/19 CT chest showed Right suprahilar lesion has decreased slightly in size in the interval since prior PET-CT.  No new or progressive findings on today's study.   04/10/2019 - 05/20/2020 Chemotherapy   04/10/2019, patient started on durvalumab Q 2 weeks maintenance.    06/14/2020 Progression   06/14/2020 CT chest w contrast showed vidence of progressive lymphangitic spread of tumor throughout the right lung, with contralateral lung nodules with   right  pleural effusion  07/01/22 MRI brain is negative for CNS involvement.  CT abdomen with contrast showed no evidence of abdominal metastatic disease PET scan was not approved by insurance-peer to peer appeal with Dr.Vipul Bhanderi    07/08/2020 Cancer Staging   Staging form: Lung, AJCC 8th Edition - Clinical stage from 07/08/2020: Stage IVA (rcT4, cM1a) - Signed by Earlie Server, MD on 07/08/2020   07/16/2020 - 09/28/2021 Chemotherapy   Carboplatin + Paclitaxel + Pembrolizumab q21d x 4 cycles      10/15/2020 Imaging   10/15/2020 CT chest abdomen pelvis redemonstrated postoperative and postradiation appearance of the right chest with perihilar consolidation and fibrosis.  Slight interval decrease in size of the pulmonary nodules associated with interlobular septal thickening nearly complete resolution of previously seen left-sided pulmonary nodules.  Minimal irregular residual.  Right pleural effusion improved.  Consistent with treatment response.  No evidence of metastatic disease with the abdomen or pelvis.  None obstructive bilateral nephrolithiasis  - partial response to the treatment-  12/15/2020, CT chest without contrast showed resolution of lung nodules, no evidence of recurrent disease   10/19/2020 -  Chemotherapy   Keytruda monotherapy maintenance   12/30/2020 [facility] Admission   patient was admitted due to unprovoked extensive deep vein thrombosis from calf vein to the common femoral vein on the left.  Patient was started on heparin drip.  Patient underwent thrombolysis and is a colectomy on 12/31/2020.  Anticoagulation was switched to Eliquis at discharge   04/22/2021 Imaging   CT chest with contrast showed none occlusive pulmonary embolism, Interval progression of consolidation airspace opacity in the medial right middle lobe with bandlike opacity and bronchiectasis in the perihilar right lung,-this is compatible with evolving posttreatment changes although recurrent disease anteriorly is  not excluded.Interval development of numerous bilateral pulmonary nodules, measuring up to 11 mm in the right lung base.  Concerning for metastatic disease.  Tiny right pleural effusion.    04/26/2021 -  Chemotherapy   resumed on carboplatin/Taxol/Keytruda. Patient did not tolerate his chemotherapy of carboplatin/Taxol/Keytruda on 04/26/2021. Patient has developed severe anemia status post PRBC transfusion. 05/25/2021, carboplatin AUC 5 with Keytruda 06/23/2021  continued on carboplatin AUC 5 with Keytruda 07/07/2021 carboplatin with [name]      07/15/2021 Progression   07/15/2021 CT chest with contrast showed enlarged right lower lobe thick-walled cavitary lesion in the anterior aspect of the right lower lobe.  Concerning for progression.  Multiple other small pulmonary nodules are also noted elsewhere in the lungs, many of which are stable compared to prior studies.  Several are new or clearly enlarged.  The best example is an enlarging pulmonary nodule in the left upper lobe, 8 x 6 mm comparing to 4 x 3 mm on 04/22/2021   08/03/2021 -  Chemotherapy   Patient is on Treatment Plan :  LUNG Docetaxel + Ramucirumab q21d      11/16/2021 Imaging   1. The previously noted thick-walled cavity of concern in the right lower lobe now appears more simple and thin-walled in appearance, presumably a post infectious cavity. There is a new adjacent thick-walled cavity in the right lower lobe which is aggressive in appearance, but given the evolution of the previous lesion, this may also be of infectious etiology. 2. However, there is an enlarging mixed cystic and solid nodule in the left upper lobe which is concerning for metastatic lesion or new primary lesion. This currently measures 11 x 5 x 10 mm. Close attention on follow-up studies is  recommended. 3. No definite signs of metastatic disease to the abdomen or pelvis. 4. Extensive colonic diverticulosis most involved in the sigmoid colon where there appear to  be several small diverticular abscesses. one of these appears to fistulized into the wall of the urinary bladder, without frank fistulization to the lumen of the urinary bladder at this time.       INTERVAL HISTORY:  Samuel Choi is here today for repeat clinical assessment. He denies fevers or chills. He denies pain. His appetite is fair. His weight is stable. Marland Kitchen  REVIEW OF SYSTEMS:  Review of Systems  Constitutional:  Positive for fatigue. Negative for appetite change, chills, fever and unexpected weight change.  HENT:   Negative for hearing loss and voice change.   Eyes:  Negative for eye problems and icterus.  Respiratory:  Negative for chest tightness, cough and shortness of breath.   Cardiovascular:  Negative for chest pain and leg swelling.  Gastrointestinal:  Negative for abdominal distention, abdominal pain and blood in stool.  Endocrine: Negative for hot flashes.  Genitourinary:  Negative for difficulty urinating, dysuria and frequency.   Musculoskeletal:  Negative for arthralgias.  Skin:  Negative for itching and rash.  Neurological:  Negative for extremity weakness, light-headedness and numbness.  Hematological:  Negative for adenopathy. Does not bruise/bleed easily.  Psychiatric/Behavioral:  Negative for confusion.       VITALS:  Blood pressure 115/78, pulse (!) 50, temperature 97.7 F (36.5 C), resp. rate 18.  Wt Readings from Last 3 Encounters:  12/02/21 125 lb (56.7 kg)  11/23/21 130 lb (59 kg)  11/18/21 126 lb (57.2 kg)    There is no height or weight on file to calculate BMI.  Performance status (ECOG): 1 - Symptomatic but completely ambulatory  PHYSICAL EXAM:  Physical Exam  LABS:      Latest Ref Rng & Units 12/16/2021   10:00 AM 12/02/2021    1:45 PM 11/18/2021   10:30 AM  CBC  WBC 4.0 - 10.5 K/uL 16.1  12.0  20.8   Hemoglobin 13.0 - 17.0 g/dL 13.2  11.5  10.8   Hematocrit 39.0 - 52.0 % 39.2  34.8  31.9   Platelets 150 - 400 K/uL 165  274  228       Latest  Ref Rng & Units 12/16/2021   10:00 AM 12/02/2021    1:45 PM 11/18/2021   10:30 AM  CMP  Glucose 70 - 99 mg/dL 116  84  80   BUN 6 - 20 mg/dL _0 Creatinine 0.61 - 1.24 mg/dL 0.94  0.95  0.78   Sodium 135 - 145 mmol/L 139  137  136   Potassium 3.5 - 5.1 mmol/L 4.2  4.0  3.8   Chloride 98 - 111 mmol/L 102  103  102   CO2 22 - 32 mmol/L _1 Calcium 8.9 - 10.3 mg/dL 9.1  8.5  8.2   Total Protein 6.5 - 8.1 g/dL 7.2  7.3  6.7   Total Bilirubin 0.3 - 1.2 mg/dL 0.9  0.5  0.4   Alkaline Phos 38 - 126 U/L 111  115  101   AST 15 - 41 U/L _2 ALT 0 - 44 U/L _3 STUDIES:  NM PET Image Restag (PS) Skull Base To Thigh  Result Date: 11/23/2021 CLINICAL DATA:  Subsequent treatment strategy for  squamous cell lung cancer. Recent chemotherapy. Prior radiation. EXAM: NUCLEAR MEDICINE PET SKULL BASE TO THIGH TECHNIQUE: 7.1 mCi F-18 FDG was injected intravenously. Full-ring PET imaging was performed from the skull base to thigh after the radiotracer. CT data was obtained and used for attenuation correction and anatomic localization. Fasting blood glucose: 110 mg/dl COMPARISON:  CT chest abdomen pelvis dated 11/15/2021 FINDINGS: Mediastinal blood pool activity: SUV max 1.4 Liver activity: SUV max NA NECK: No hypermetabolic cervical lymphadenopathy. Stable 9 mm lesion deep to the left parotid (series 2/image 31), max SUV 4.1, previously 4.6. Incidental CT findings: none CHEST: Status post right upper lobectomy. Radiation changes in the right hemithorax. 9 mm nodular opacity in the central left upper lobe (series 2/image 32), max SUV 1.7. This is enlarging on CT, raising concern for primary bronchogenic neoplasm or metastasis. 1.9 cm thick-walled cavitary lesion in the lateral right lower lobe (series 2/image 100), max SUV 4.5, but new from January 2023. Adjacent 3.3 cm thin-walled cavitary lesion in the anteromedial right lower lobe (series 2/image 105) is improved from January 2023  and non FDG avid. As such, infectious/inflammation is favored. No hypermetabolic thoracic lymphadenopathy. Incidental CT findings: Better evaluated on recent dedicated CT chest. Mosaic attenuation in the lungs bilaterally. ABDOMEN/PELVIS: No abnormal hypermetabolism in the liver, spleen, pancreas, or adrenal glands. No hypermetabolic abdominopelvic lymphadenopathy. Multifocal hypermetabolism adjacent to the sigmoid colon, corresponding to the chronic diverticular abscesses noted on recent CT. Representative max SUV 5.6. Incidental CT findings: Better evaluated on recent dedicated CT chest. Vascular calcifications. Thick-walled bladder. Sigmoid diverticulosis. Prominent rectal stool burden. SKELETON: No focal hypermetabolic activity to suggest skeletal metastasis. Incidental CT findings: none IMPRESSION: 9 mm nodular opacity in the central left upper lobe demonstrates mild hypermetabolism and is enlarging on CT, raising concern for primary bronchogenic neoplasm or metastasis. 1.9 cm thick-walled cavitary lesion in the lateral right lower lobe is hypermetabolic but new from January 2023, favoring infection/inflammation. Adjacent 3.3 cm thin-walled cavitary lesion in the right lower lobe is improved and non FDG avid. Attention on follow-up is suggested. Status post right upper lobectomy with radiation changes in the right hemithorax. 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 Electronically Signed   By: Julian Hy M.D.   On: 11/23/2021 00:09      HISTORY:   Past Medical History:  Diagnosis Date   Alcohol abuse    usually drinks 2-3 drinks per day   Atherosclerosis 06/2018   Chronic sinusitis    Dehydration 02/07/2019   Emphysema of lung ([place]) 06/2018   patient unaware of this.   Hip fracture ([place]) 05/2018   no surgery   History of kidney stones 05/2018   per xray, bilateral nephrolitiasis   Hypertension    Squamous cell carcinoma of lung, right  ([place]) 06/2018    Past Surgical History:  Procedure Laterality Date   BRAIN SURGERY  10/2017   nasal/sinus endoscopy. mass benign   ELECTROMAGNETIC NAVIGATION BROCHOSCOPY Right 06/25/2018   Procedure: ELECTROMAGNETIC NAVIGATION BRONCHOSCOPY;  Surgeon: [name], MD;  Location: [facility];  Service: Cardiopulmonary;  Laterality: Right;   ENDOBRONCHIAL ULTRASOUND Right 06/25/2018   Procedure: ENDOBRONCHIAL ULTRASOUND;  Surgeon: [name], MD;  Location: [facility];  Service: Cardiopulmonary;  Laterality: Right;   ENDOBRONCHIAL ULTRASOUND Right 12/26/2018   Procedure: ENDOBRONCHIAL ULTRASOUND RIGHT;  Surgeon: [name], MD;  Location: [facility];  Service: Cardiopulmonary;  Laterality: Right;   FLEXIBLE BRONCHOSCOPY N/A 08/20/2018   Procedure: FLEXIBLE BRONCHOSCOPY PREOP;  Surgeon:  [name], MD;  Location: [facility];  Service: Thoracic;  Laterality: N/A;   IR CV LINE INJECTION  04/04/2019   NASAL SINUS SURGERY  10/2017   At [facility], frontal sinusotomy, ethmoidectomy, resection anterior cranial fossa neoplasm, turbinate resection   PERIPHERAL VASCULAR THROMBECTOMY Left 12/31/2020   Procedure: PERIPHERAL VASCULAR THROMBECTOMY;  Surgeon: Algernon Huxley, MD;  Location: [place] [facility];  Service: Cardiovascular;  Laterality: Left;   PORTACATH PLACEMENT Left 01/15/2019   Procedure: INSERTION PORT-A-CATH;  Surgeon: [name], MD;  Location: [facility];  Service: General;  Laterality: Left;   THORACOTOMY Right 08/13/2018   Procedure: PREOP BROCHOSCOPY WITH RIGHT THORACOTOMY AND RUL RESECTION;  Surgeon: [name], MD;  Location: [facility];  Service: General;  Laterality: Right;   THORACOTOMY Right 08/20/2018   Procedure: THORACOTOMY MAJOR RIGHT UPPER LOBE LOBECTOMY;  Surgeon: [name], MD;  Location: [facility];  Service: Thoracic;  Laterality: Right;   TOE SURGERY Left    pin in left toe    Family History  Problem Relation Age of Onset   Breast cancer Mother    Diabetes Mother    Lung  cancer Father    Hypertension Father     Social History:  reports that he quit smoking about 3 years ago. His smoking use included cigarettes. He has a 7.50 pack-year smoking history. He has never used smokeless tobacco. He reports current alcohol use of about 3.0 standard drinks of alcohol per week. He reports that he does not use drugs.  Allergies: No Known Allergies  Current Medications: Current Outpatient Medications  Medication Sig Dispense Refill   albuterol (VENTOLIN HFA) 108 (90 Base) MCG/ACT inhaler INHALE 2 PUFFS BY MOUTH EVERY 6 HOURS AS NEEDED FOR WHEEZE OR SHORTNESS OF BREATH 8.5 g 5   apixaban (ELIQUIS) 2.5 MG TABS tablet Take 1 tablet (2.5 mg total) by mouth 2 (two) times daily. 60 tablet 6   budesonide-formoterol (SYMBICORT) 80-4.5 MCG/ACT inhaler Inhale 2 puffs into the lungs 2 (two) times daily. in the morning and at bedtime. 10.2 each 12   diltiazem (CARDIZEM CD) 120 MG 24 hr capsule Take 120 mg by mouth daily.     latanoprost (XALATAN) 0.005 % ophthalmic solution SMARTSIG:In Eye(s)     loratadine (CLARITIN) 10 MG tablet Take 10 mg by mouth daily.     metoprolol succinate (TOPROL-XL) 25 MG 24 hr tablet Take 1 tablet (25 mg total) by mouth daily. 30 tablet 0   nystatin (MYCOSTATIN) 100000 UNIT/ML suspension Take 5 mLs (500,000 Units total) by mouth 4 (four) times daily. Swish and swallow. 473 mL 1   ondansetron (ZOFRAN) 8 MG tablet Take 1 tablet (8 mg total) by mouth every 8 (eight) hours as needed for refractory nausea / vomiting. Start on day 3 after chemo. 90 tablet 1   pantoprazole (PROTONIX) 20 MG tablet Take 1 tablet (20 mg total) by mouth daily. 60 tablet 2   [START ON 12/23/2021] predniSONE (DELTASONE) 20 MG tablet Take 2 tablets (40 mg total) by mouth daily with breakfast. 14 tablet 0   predniSONE (DELTASONE) 50 MG tablet Take 1 tablet (50 mg total) by mouth daily. 7 tablet 0   promethazine (PHENERGAN) 25 MG tablet Take 1 tablet (25 mg total) by mouth every 6 (six)  hours as needed for nausea or vomiting. 30 tablet 0   SPIRIVA HANDIHALER 18 MCG inhalation capsule INHALE 1 CAPSULE VIA HANDIHALER ONCE DAILY AT THE SAME TIME EVERY DAY 30 capsule 0   vitamin B-12 (  CYANOCOBALAMIN) 1000 MCG tablet Take 1 tablet (1,000 mcg total) by mouth daily. 90 tablet 1   dexamethasone (DECADRON) 4 MG tablet Take 2 tablets (8 mg total) by mouth 2 (two) times daily. Start the day before Taxotere. Then daily after chemo for 2 days. (Patient not taking: Reported on 12/16/2021) 30 tablet 1   folic acid (V-R FOLIC ACID) 606 MCG tablet Take 1 tablet (400 mcg total) by mouth daily. (Patient not taking: Reported on 12/16/2021) 90 tablet 1   HYDROcodone-acetaminophen (NORCO/VICODIN) 5-325 MG tablet Take 1 tablet by mouth every 8 (eight) hours as needed for severe pain. (Patient not taking: Reported on 12/16/2021) 30 tablet 0   loperamide (IMODIUM) 2 MG capsule Take 1 capsule (2 mg total) by mouth See admin instructions. Initial: 4 mg, followed by 2 mg after each loose stool; maximum: 16 mg/day (Patient not taking: Reported on 12/16/2021) 60 capsule 0   methocarbamol (ROBAXIN) 500 MG tablet Take 1 tablet (500 mg total) by mouth at bedtime. (Patient not taking: Reported on 12/16/2021) 30 tablet 0   olmesartan (BENICAR) 40 MG tablet Take 40 mg by mouth every other day.  (Patient not taking: Reported on 12/16/2021)     No current facility-administered medications for this visit.   Facility-Administered Medications Ordered in Other Visits  Medication Dose Route Frequency Provider Last Rate Last Admin   dexamethasone (DECADRON) 10 MG/ML injection            heparin lock flush 100 unit/mL  500 Units Intravenous Once Earlie Server, MD       heparin lock flush 100 unit/mL  500 Units Intravenous Once Earlie Server, MD       sodium chloride flush (NS) 0.9 % injection 10 mL  10 mL Intravenous PRN Earlie Server, MD   10 mL at 04/04/19 0903   sodium chloride flush (NS) 0.9 % injection 10 mL  10 mL Intravenous PRN Earlie Server, MD    10 mL at 07/26/20 1308   sodium chloride flush (NS) 0.9 % injection 10 mL  10 mL Intravenous PRN Earlie Server, MD   10 mL at 07/28/21 [phone]

## 2021-12-17 NOTE — Assessment & Plan Note (Signed)
Unprovoked left lower extremity DVT, status post thrombolysis/thrombectomy, Nonocclusive PE Patient has completed 6+ months of therapeutic dose of Eliquis.  Continue Eliquis 2.5 mg p.o. twice daily.

## 2021-12-17 NOTE — Assessment & Plan Note (Signed)
Continue Slow-Mag twice daily.

## 2021-12-17 NOTE — Assessment & Plan Note (Signed)
07/16/2020-09/28/2020 carboplatin Taxol and Keytruda x 1  partial response -Keytruda maintenance.-04/22/2021, progression-resumed on carboplatin/Taxol/Keytruda - did not tolerate due to myelosuppression.-->  Taxol held, carboplatin/Keytruda--> CT 07/2020 progression --> 08/03/2021 started docetaxel and ramucirumab.--> Reviewed and discussed with patient.  Hold treatment

## 2021-12-17 NOTE — Assessment & Plan Note (Signed)
Continue potassium supplementation

## 2021-12-17 NOTE — Assessment & Plan Note (Signed)
patient refuses nutrition supplements.  encourage patient to follow-up with nutritionist

## 2021-12-23 ENCOUNTER — Inpatient Hospital Stay: Payer: 59

## 2021-12-23 VITALS — BP 126/70 | HR 90 | Temp 98.2°F | Resp 17

## 2021-12-23 DIAGNOSIS — Z5112 Encounter for antineoplastic immunotherapy: Secondary | ICD-10-CM | POA: Diagnosis not present

## 2021-12-23 DIAGNOSIS — E860 Dehydration: Secondary | ICD-10-CM

## 2021-12-23 MED ORDER — HEPARIN SOD (PORK) LOCK FLUSH 100 UNIT/ML IV SOLN
500.0000 [IU] | Freq: Once | INTRAVENOUS | Status: AC
  Administered 2021-12-23: 500 [IU] via INTRAVENOUS
  Filled 2021-12-23: qty 5

## 2021-12-23 MED ORDER — SODIUM CHLORIDE 0.9% FLUSH
10.0000 mL | Freq: Once | INTRAVENOUS | Status: AC
  Administered 2021-12-23: 10 mL via INTRAVENOUS
  Filled 2021-12-23: qty 10

## 2021-12-23 MED ORDER — SODIUM CHLORIDE 0.9 % IV SOLN
Freq: Once | INTRAVENOUS | Status: AC
  Filled 2021-12-23: qty 250

## 2021-12-30 ENCOUNTER — Inpatient Hospital Stay: Payer: 59

## 2021-12-30 ENCOUNTER — Encounter: Payer: Self-pay | Admitting: Oncology

## 2021-12-30 ENCOUNTER — Inpatient Hospital Stay (HOSPITAL_BASED_OUTPATIENT_CLINIC_OR_DEPARTMENT_OTHER): Payer: 59 | Admitting: Oncology

## 2021-12-30 VITALS — BP 118/75 | HR 90 | Resp 18

## 2021-12-30 DIAGNOSIS — Z86718 Personal history of other venous thrombosis and embolism: Secondary | ICD-10-CM

## 2021-12-30 DIAGNOSIS — Z5111 Encounter for antineoplastic chemotherapy: Secondary | ICD-10-CM

## 2021-12-30 DIAGNOSIS — E43 Unspecified severe protein-calorie malnutrition: Secondary | ICD-10-CM

## 2021-12-30 DIAGNOSIS — C3491 Malignant neoplasm of unspecified part of right bronchus or lung: Secondary | ICD-10-CM

## 2021-12-30 DIAGNOSIS — K5730 Diverticulosis of large intestine without perforation or abscess without bleeding: Secondary | ICD-10-CM

## 2021-12-30 DIAGNOSIS — E876 Hypokalemia: Secondary | ICD-10-CM

## 2021-12-30 DIAGNOSIS — Z5112 Encounter for antineoplastic immunotherapy: Secondary | ICD-10-CM | POA: Diagnosis not present

## 2021-12-30 LAB — COMPREHENSIVE METABOLIC PANEL
ALT: 17 U/L (ref 0–44)
AST: 29 U/L (ref 15–41)
Albumin: 3.5 g/dL (ref 3.5–5.0)
Alkaline Phosphatase: 94 U/L (ref 38–126)
Anion gap: 13 (ref 5–15)
BUN: 20 mg/dL (ref 6–20)
CO2: 23 mmol/L (ref 22–32)
Calcium: 8.6 mg/dL — ABNORMAL LOW (ref 8.9–10.3)
Chloride: 98 mmol/L (ref 98–111)
Creatinine, Ser: 1.09 mg/dL (ref 0.61–1.24)
GFR, Estimated: >60 mL/min (ref >60)
Glucose, Bld: 172 mg/dL — ABNORMAL HIGH (ref 70–99)
Potassium: 4.2 mmol/L (ref 3.5–5.1)
Sodium: 134 mmol/L — ABNORMAL LOW (ref 135–145)
Total Bilirubin: 1.5 mg/dL — ABNORMAL HIGH (ref 0.3–1.2)
Total Protein: 7.2 g/dL (ref 6.5–8.1)

## 2021-12-30 LAB — CBC WITH DIFFERENTIAL/PLATELET
Abs Immature Granulocytes: 0.12 10*3/uL — ABNORMAL HIGH (ref 0.00–0.07)
Basophils Absolute: 0 10*3/uL (ref 0.0–0.1)
Basophils Relative: 0 %
Eosinophils Absolute: 0 10*3/uL (ref 0.0–0.5)
Eosinophils Relative: 0 %
HCT: 35.4 % — ABNORMAL LOW (ref 39.0–52.0)
Hemoglobin: 12.4 g/dL — ABNORMAL LOW (ref 13.0–17.0)
Immature Granulocytes: 1 %
Lymphocytes Relative: 2 %
Lymphs Abs: 0.4 10*3/uL — ABNORMAL LOW (ref 0.7–4.0)
MCH: 36.7 pg — ABNORMAL HIGH (ref 26.0–34.0)
MCHC: 35 g/dL (ref 30.0–36.0)
MCV: 104.7 fL — ABNORMAL HIGH (ref 80.0–100.0)
Monocytes Absolute: 0.3 10*3/uL (ref 0.1–1.0)
Monocytes Relative: 1 %
Neutro Abs: 18.8 10*3/uL — ABNORMAL HIGH (ref 1.7–7.7)
Neutrophils Relative %: 96 %
Platelets: 147 10*3/uL — ABNORMAL LOW (ref 150–400)
RBC: 3.38 MIL/uL — ABNORMAL LOW (ref 4.22–5.81)
RDW: 16 % — ABNORMAL HIGH (ref 11.5–15.5)
WBC: 19.6 10*3/uL — ABNORMAL HIGH (ref 4.0–10.5)
nRBC: 0 % (ref 0.0–0.2)

## 2021-12-30 LAB — TSH: TSH: 0.685 u[IU]/mL (ref 0.350–4.500)

## 2021-12-30 LAB — PROTEIN, URINE, RANDOM: Total Protein, Urine: 79 mg/dL

## 2021-12-30 MED ORDER — SODIUM CHLORIDE 0.9 % IV SOLN
10.0000 mg | Freq: Once | INTRAVENOUS | Status: AC
  Administered 2021-12-30: 10 mg via INTRAVENOUS
  Filled 2021-12-30: qty 1
  Filled 2021-12-30: qty 10

## 2021-12-30 MED ORDER — SODIUM CHLORIDE 0.9 % IV SOLN
75.0000 mg/m2 | Freq: Once | INTRAVENOUS | Status: AC
  Administered 2021-12-30: 130 mg via INTRAVENOUS
  Filled 2021-12-30: qty 13

## 2021-12-30 MED ORDER — DIPHENHYDRAMINE HCL 50 MG/ML IJ SOLN
50.0000 mg | Freq: Once | INTRAMUSCULAR | Status: AC
  Administered 2021-12-30: 50 mg via INTRAVENOUS
  Filled 2021-12-30: qty 1

## 2021-12-30 MED ORDER — HEPARIN SOD (PORK) LOCK FLUSH 100 UNIT/ML IV SOLN
500.0000 [IU] | Freq: Once | INTRAVENOUS | Status: AC | PRN
  Administered 2021-12-30: 500 [IU]
  Filled 2021-12-30: qty 5

## 2021-12-30 MED ORDER — SODIUM CHLORIDE 0.9 % IV SOLN
10.0000 mg/kg | Freq: Once | INTRAVENOUS | Status: AC
  Administered 2021-12-30: 600 mg via INTRAVENOUS
  Filled 2021-12-30: qty 50

## 2021-12-30 MED ORDER — SODIUM CHLORIDE 0.9 % IV SOLN
Freq: Once | INTRAVENOUS | Status: AC
  Filled 2021-12-30: qty 250

## 2021-12-30 MED ORDER — ACETAMINOPHEN 325 MG PO TABS
650.0000 mg | ORAL_TABLET | Freq: Once | ORAL | Status: AC
  Administered 2021-12-30: 650 mg via ORAL
  Filled 2021-12-30: qty 2

## 2021-12-31 ENCOUNTER — Encounter: Payer: Self-pay | Admitting: Oncology

## 2021-12-31 ENCOUNTER — Other Ambulatory Visit: Payer: Self-pay | Admitting: Oncology

## 2021-12-31 LAB — T3, FREE: T3, Free: 2.1 pg/mL (ref 2.0–4.4)

## 2022-01-02 ENCOUNTER — Other Ambulatory Visit: Payer: Self-pay | Admitting: *Deleted

## 2022-01-02 ENCOUNTER — Inpatient Hospital Stay: Payer: 59

## 2022-01-02 DIAGNOSIS — Z5112 Encounter for antineoplastic immunotherapy: Secondary | ICD-10-CM | POA: Diagnosis not present

## 2022-01-02 DIAGNOSIS — C3491 Malignant neoplasm of unspecified part of right bronchus or lung: Secondary | ICD-10-CM

## 2022-01-02 MED ORDER — PEGFILGRASTIM-BMEZ 6 MG/0.6ML ~~LOC~~ SOSY
6.0000 mg | PREFILLED_SYRINGE | Freq: Once | SUBCUTANEOUS | Status: AC
  Administered 2022-01-02: 6 mg via SUBCUTANEOUS
  Filled 2022-01-02: qty 0.6

## 2022-01-03 ENCOUNTER — Telehealth: Payer: Self-pay | Admitting: *Deleted

## 2022-01-03 ENCOUNTER — Other Ambulatory Visit: Payer: Self-pay | Admitting: Oncology

## 2022-01-03 ENCOUNTER — Encounter: Payer: Self-pay | Admitting: Oncology

## 2022-01-03 MED ORDER — PREDNISONE 5 MG PO TABS
5.0000 mg | ORAL_TABLET | Freq: Every day | ORAL | 0 refills | Status: DC
Start: 2022-01-17 — End: 2022-01-25

## 2022-01-03 MED ORDER — ONDANSETRON HCL 8 MG PO TABS: 8.0000 mg | ORAL_TABLET | Freq: Three times a day (TID) | ORAL | 1 refills | Status: DC | PRN

## 2022-01-03 MED ORDER — PREDNISONE 10 MG PO TABS: 10.0000 mg | ORAL_TABLET | Freq: Every day | ORAL | 0 refills | Status: DC

## 2022-01-03 MED ORDER — PREDNISONE 20 MG PO TABS: 20.0000 mg | ORAL_TABLET | Freq: Every day | ORAL | 0 refills | Status: DC

## 2022-01-03 NOTE — Telephone Encounter (Signed)
Patient called stating that Dr Cathie Hoops was to have sent in new prescription for prednisone Friday and pharmacy has not received it yet. I see the last prednisone prescription was sent 6/16 and he states he has used all of that. Please advise

## 2022-01-05 ENCOUNTER — Encounter: Payer: Self-pay | Admitting: Nurse Practitioner

## 2022-01-05 ENCOUNTER — Inpatient Hospital Stay: Payer: 59

## 2022-01-05 ENCOUNTER — Other Ambulatory Visit: Payer: Self-pay

## 2022-01-05 ENCOUNTER — Inpatient Hospital Stay (HOSPITAL_BASED_OUTPATIENT_CLINIC_OR_DEPARTMENT_OTHER): Payer: 59 | Admitting: Nurse Practitioner

## 2022-01-05 VITALS — BP 143/77 | HR 108 | Temp 97.6°F | Resp 16 | Wt 130.5 lb

## 2022-01-05 DIAGNOSIS — T451X5A Adverse effect of antineoplastic and immunosuppressive drugs, initial encounter: Secondary | ICD-10-CM

## 2022-01-05 DIAGNOSIS — D701 Agranulocytosis secondary to cancer chemotherapy: Secondary | ICD-10-CM

## 2022-01-05 DIAGNOSIS — C3491 Malignant neoplasm of unspecified part of right bronchus or lung: Secondary | ICD-10-CM

## 2022-01-05 DIAGNOSIS — E43 Unspecified severe protein-calorie malnutrition: Secondary | ICD-10-CM | POA: Diagnosis not present

## 2022-01-05 DIAGNOSIS — E860 Dehydration: Secondary | ICD-10-CM

## 2022-01-05 DIAGNOSIS — Z5112 Encounter for antineoplastic immunotherapy: Secondary | ICD-10-CM | POA: Diagnosis not present

## 2022-01-05 LAB — COMPREHENSIVE METABOLIC PANEL
ALT: 16 U/L (ref 0–44)
AST: 27 U/L (ref 15–41)
Albumin: 3.3 g/dL — ABNORMAL LOW (ref 3.5–5.0)
Alkaline Phosphatase: 66 U/L (ref 38–126)
Anion gap: 12 (ref 5–15)
BUN: 19 mg/dL (ref 6–20)
CO2: 23 mmol/L (ref 22–32)
Calcium: 8.4 mg/dL — ABNORMAL LOW (ref 8.9–10.3)
Chloride: 96 mmol/L — ABNORMAL LOW (ref 98–111)
Creatinine, Ser: 0.97 mg/dL (ref 0.61–1.24)
GFR, Estimated: >60 mL/min (ref >60)
Glucose, Bld: 116 mg/dL — ABNORMAL HIGH (ref 70–99)
Potassium: 3.5 mmol/L (ref 3.5–5.1)
Sodium: 131 mmol/L — ABNORMAL LOW (ref 135–145)
Total Bilirubin: 0.5 mg/dL (ref 0.3–1.2)
Total Protein: 6.4 g/dL — ABNORMAL LOW (ref 6.5–8.1)

## 2022-01-05 LAB — CBC WITH DIFFERENTIAL/PLATELET
Abs Immature Granulocytes: 0 K/uL (ref 0.00–0.07)
Band Neutrophils: 1 %
Basophils Absolute: 0 K/uL (ref 0.0–0.1)
Basophils Relative: 0 %
Eosinophils Absolute: 0.1 K/uL (ref 0.0–0.5)
Eosinophils Relative: 5 %
HCT: 33.8 % — ABNORMAL LOW (ref 39.0–52.0)
Hemoglobin: 11.9 g/dL — ABNORMAL LOW (ref 13.0–17.0)
Lymphocytes Relative: 21 %
Lymphs Abs: 0.2 K/uL — ABNORMAL LOW (ref 0.7–4.0)
MCH: 36.3 pg — ABNORMAL HIGH (ref 26.0–34.0)
MCHC: 35.2 g/dL (ref 30.0–36.0)
MCV: 103 fL — ABNORMAL HIGH (ref 80.0–100.0)
Metamyelocytes Relative: 2 %
Monocytes Absolute: 0 K/uL — ABNORMAL LOW (ref 0.1–1.0)
Monocytes Relative: 4 %
Neutro Abs: 0.7 K/uL — ABNORMAL LOW (ref 1.7–7.7)
Neutrophils Relative %: 67 %
Platelets: 94 K/uL — ABNORMAL LOW (ref 150–400)
RBC: 3.28 MIL/uL — ABNORMAL LOW (ref 4.22–5.81)
RDW: 14.6 % (ref 11.5–15.5)
Smear Review: NORMAL
WBC: 1 K/uL — CL (ref 4.0–10.5)
nRBC: 0 % (ref 0.0–0.2)

## 2022-01-05 MED ORDER — HEPARIN SOD (PORK) LOCK FLUSH 100 UNIT/ML IV SOLN
500.0000 [IU] | Freq: Once | INTRAVENOUS | Status: AC
  Administered 2022-01-05: 500 [IU] via INTRAVENOUS
  Filled 2022-01-05: qty 5

## 2022-01-05 MED ORDER — SODIUM CHLORIDE 0.9% FLUSH
10.0000 mL | Freq: Once | INTRAVENOUS | Status: AC
  Administered 2022-01-05: 10 mL via INTRAVENOUS
  Filled 2022-01-05: qty 10

## 2022-01-05 MED ORDER — SODIUM CHLORIDE 0.9 % IV SOLN
Freq: Once | INTRAVENOUS | Status: AC
  Filled 2022-01-05: qty 250

## 2022-01-05 NOTE — Progress Notes (Signed)
Hematology/Oncology Progress Note [phone] [phone]   Clinic Day:  01/05/2022   Referring physician: Tracie Harrier, MD  CHIEF COMPLAINT:  CC: Follow up for lung cancer treatments.   Current Treatment:  Docetaxel Ramucizumab  HISTORY OF PRESENT ILLNESS:   Oncology History  Squamous cell carcinoma of right lung ([place])  08/13/2018 Surgery   s/p Bronchoscopy on 06/25/2018. subcarina EBUS FNA was non diagnostic, hypocellular specimen.  08/13/18 Patient underwent right thoracotomy and wedge resection of a right upper lobe mass.  Frozen section was consistent with an inflammatory process.  On the second postop day, preliminary pathology reports high-grade malignancy.  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  08/20/2018 Patient was therefore taken back to the OR and underwent take complete lobectomy  pT1b pN0 cM0 stage I squamous cell lung cancer. Margin is negative.  Recommend observation.     08/13/2018 Initial Diagnosis   Stage I Squamous cell carcinoma of lung, right ([place])   12/03/2018 Progression   12/03/2018 CT chest showed abnormal soft tissue in the right suprahilar region measuring up to 2.5 cm, worrisome for recurrence 07/18/1476 PET Hypermetabolic RIGHT suprahilar mass consistent with lung cancer, recurrence. 12/26/2018 s/p bronchoscopy biopsy. Confirmed local recurrence of squamous cell lung cancer.  medi port placed by Dr.Oaks. 01/20/19 MRI brain w/wo contrast: No evidence of metastatic disease   01/17/2019 - 03/07/2019 Chemotherapy   concurrent chemoradiation on 01/16/2019 Carboplatin AUC of 2 and Taxol 45 mg/m2 weekly finished in 03/05/2019    03/31/2019 Imaging   03/31/19 CT chest showed Right suprahilar lesion has decreased slightly in size in the interval since prior PET-CT.  No new or progressive findings on today's study.   04/10/2019 - 05/20/2020 Chemotherapy   04/10/2019, patient started on durvalumab Q 2 weeks maintenance.    06/14/2020  Progression   06/14/2020 CT chest w contrast showed vidence of progressive lymphangitic spread of tumor throughout the right lung, with contralateral lung nodules with  right pleural effusion  07/01/22 MRI brain is negative for CNS involvement.  CT abdomen with contrast showed no evidence of abdominal metastatic disease PET scan was not approved by insurance-peer to peer appeal with Dr.Vipul Bhanderi    07/08/2020 Cancer Staging   Staging form: Lung, AJCC 8th Edition - Clinical stage from 07/08/2020: Stage IVA (rcT4, cM1a) - Signed by Earlie Server, MD on 07/08/2020   07/16/2020 - 09/28/2021 Chemotherapy   Carboplatin + Paclitaxel + Pembrolizumab q21d x 4 cycles      10/15/2020 Imaging   10/15/2020 CT chest abdomen pelvis redemonstrated postoperative and postradiation appearance of the right chest with perihilar consolidation and fibrosis.  Slight interval decrease in size of the pulmonary nodules associated with interlobular septal thickening nearly complete resolution of previously seen left-sided pulmonary nodules.  Minimal irregular residual.  Right pleural effusion improved.  Consistent with treatment response.  No evidence of metastatic disease with the abdomen or pelvis.  None obstructive bilateral nephrolithiasis  - partial response to the treatment-  12/15/2020, CT chest without contrast showed resolution of lung nodules, no evidence of recurrent disease   10/19/2020 -  Chemotherapy   Keytruda monotherapy maintenance   12/30/2020 [facility] Admission   patient was admitted due to unprovoked extensive deep vein thrombosis from calf vein to the common femoral vein on the left.  Patient was started on heparin drip.  Patient underwent thrombolysis and is a colectomy on 12/31/2020.  Anticoagulation was switched to Eliquis at discharge   04/22/2021 Imaging   CT chest with  contrast showed none occlusive pulmonary embolism, Interval progression of consolidation airspace opacity in the medial right middle  lobe with bandlike opacity and bronchiectasis in the perihilar right lung,-this is compatible with evolving posttreatment changes although recurrent disease anteriorly is not excluded.Interval development of numerous bilateral pulmonary nodules, measuring up to 11 mm in the right lung base.  Concerning for metastatic disease.  Tiny right pleural effusion.    04/26/2021 -  Chemotherapy   resumed on carboplatin/Taxol/Keytruda. Patient did not tolerate his chemotherapy of carboplatin/Taxol/Keytruda on 04/26/2021. Patient has developed severe anemia status post PRBC transfusion. 05/25/2021, carboplatin AUC 5 with Keytruda 06/23/2021  continued on carboplatin AUC 5 with Keytruda 07/07/2021 carboplatin with [name]      07/15/2021 Progression   07/15/2021 CT chest with contrast showed enlarged right lower lobe thick-walled cavitary lesion in the anterior aspect of the right lower lobe.  Concerning for progression.  Multiple other small pulmonary nodules are also noted elsewhere in the lungs, many of which are stable compared to prior studies.  Several are new or clearly enlarged.  The best example is an enlarging pulmonary nodule in the left upper lobe, 8 x 6 mm comparing to 4 x 3 mm on 04/22/2021   08/03/2021 -  Chemotherapy   Patient is on Treatment Plan :  LUNG Docetaxel + Ramucirumab q21d      11/16/2021 Imaging   1. The previously noted thick-walled cavity of concern in the right lower lobe now appears more simple and thin-walled in appearance, presumably a post infectious cavity. There is a new adjacent thick-walled cavity in the right lower lobe which is aggressive in appearance, but given the evolution of the previous lesion, this may also be of infectious etiology. 2. However, there is an enlarging mixed cystic and solid nodule in the left upper lobe which is concerning for metastatic lesion or new primary lesion. This currently measures 11 x 5 x 10 mm. Close attention on follow-up studies is  recommended. 3. No definite signs of metastatic disease to the abdomen or pelvis. 4. Extensive colonic diverticulosis most involved in the sigmoid colon where there appear to be several small diverticular abscesses. one of these appears to fistulized into the wall of the urinary bladder, without frank fistulization to the lumen of the urinary bladder at this time.       INTERVAL HISTORY:  Samuel Choi is 56 year old male currently undergoing doxil-ram chemotherapy who returns to clinic for follow up and consideration of IV Fluids. He continues prednisone with plan for 20 mg x 1 week, 10 mg x 1 week, and 5 mg x 1 week. Denies complaints. Feels at baseline.    REVIEW OF SYSTEMS:  Review of Systems  Constitutional:  Positive for fatigue. Negative for appetite change, chills, fever and unexpected weight change.  HENT:   Negative for hearing loss and voice change.   Eyes:  Negative for eye problems and icterus.  Respiratory:  Negative for chest tightness, cough and shortness of breath.   Cardiovascular:  Negative for chest pain and leg swelling.  Gastrointestinal:  Negative for abdominal distention, abdominal pain and blood in stool.  Endocrine: Negative for hot flashes.  Genitourinary:  Negative for difficulty urinating, dysuria and frequency.   Musculoskeletal:  Negative for arthralgias.  Skin:  Negative for itching and rash.  Neurological:  Negative for extremity weakness, light-headedness and numbness.  Hematological:  Negative for adenopathy. Does not bruise/bleed easily.  Psychiatric/Behavioral:  Negative for confusion.  VITALS:  Blood pressure (!) 143/77, pulse (!) 108, temperature 97.6 F (36.4 C), temperature source Tympanic, resp. rate 16, weight 130 lb 8 oz (59.2 kg), SpO2 96 %.  Wt Readings from Last 3 Encounters:  01/05/22 130 lb 8 oz (59.2 kg)  12/30/21 130 lb 8 oz (59.2 kg)  12/02/21 125 lb (56.7 kg)    Body mass index is 17.22 kg/m.  Performance status (ECOG): 1 -  Symptomatic but completely ambulatory  PHYSICAL EXAM:  Physical Exam Vitals reviewed.  Constitutional:      Appearance: He is ill-appearing.  Cardiovascular:     Rate and Rhythm: Normal rate and regular rhythm.  Pulmonary:     Effort: Pulmonary effort is normal.     Breath sounds: Normal breath sounds.  Abdominal:     General: Abdomen is flat.     Palpations: Abdomen is soft.  Skin:    General: Skin is warm and dry.     Coloration: Skin is not jaundiced.  Neurological:     Mental Status: He is alert and oriented to person, place, and time.  Psychiatric:        Mood and Affect: Mood normal.        Behavior: Behavior normal.     LABS:      Latest Ref Rng & Units 12/30/2021    9:10 AM 12/16/2021   10:00 AM 12/02/2021    1:45 PM  CBC  WBC 4.0 - 10.5 K/uL 19.6  16.1  12.0   Hemoglobin 13.0 - 17.0 g/dL 12.4  13.2  11.5   Hematocrit 39.0 - 52.0 % 35.4  39.2  34.8   Platelets 150 - 400 K/uL 147  165  274       Latest Ref Rng & Units 12/30/2021    9:10 AM 12/16/2021   10:00 AM 12/02/2021    1:45 PM  CMP  Glucose 70 - 99 mg/dL 172  116  84   BUN 6 - 20 mg/dL _0 Creatinine 0.61 - 1.24 mg/dL 1.09  0.94  0.95   Sodium 135 - 145 mmol/L 134  139  137   Potassium 3.5 - 5.1 mmol/L 4.2  4.2  4.0   Chloride 98 - 111 mmol/L 98  102  103   CO2 22 - 32 mmol/L _1 Calcium 8.9 - 10.3 mg/dL 8.6  9.1  8.5   Total Protein 6.5 - 8.1 g/dL 7.2  7.2  7.3   Total Bilirubin 0.3 - 1.2 mg/dL 1.5  0.9  0.5   Alkaline Phos 38 - 126 U/L 94  111  115   AST 15 - 41 U/L _2 ALT 0 - 44 U/L _3 HISTORY:   Past Medical History:  Diagnosis Date   Alcohol abuse    usually drinks 2-3 drinks per day   Atherosclerosis 06/2018   Chronic sinusitis    Dehydration 02/07/2019   Emphysema of lung ([place]) 06/2018   patient unaware of this.   Hip fracture ([place]) 05/2018   no surgery   History of kidney stones 05/2018   per xray, bilateral nephrolitiasis   Hypertension     Squamous cell carcinoma of lung, right ([place]) 06/2018    Past Surgical History:  Procedure Laterality Date   BRAIN SURGERY  10/2017   nasal/sinus endoscopy. mass benign   ELECTROMAGNETIC NAVIGATION BROCHOSCOPY Right  06/25/2018   Procedure: ELECTROMAGNETIC NAVIGATION BRONCHOSCOPY;  Surgeon: [name], MD;  Location: [facility];  Service: Cardiopulmonary;  Laterality: Right;   ENDOBRONCHIAL ULTRASOUND Right 06/25/2018   Procedure: ENDOBRONCHIAL ULTRASOUND;  Surgeon: [name], MD;  Location: [facility];  Service: Cardiopulmonary;  Laterality: Right;   ENDOBRONCHIAL ULTRASOUND Right 12/26/2018   Procedure: ENDOBRONCHIAL ULTRASOUND RIGHT;  Surgeon: [name], MD;  Location: [facility];  Service: Cardiopulmonary;  Laterality: Right;   FLEXIBLE BRONCHOSCOPY N/A 08/20/2018   Procedure: FLEXIBLE BRONCHOSCOPY PREOP;  Surgeon: [name], MD;  Location: [facility];  Service: Thoracic;  Laterality: N/A;   IR CV LINE INJECTION  04/04/2019   NASAL SINUS SURGERY  10/2017   At [facility], frontal sinusotomy, ethmoidectomy, resection anterior cranial fossa neoplasm, turbinate resection   PERIPHERAL VASCULAR THROMBECTOMY Left 12/31/2020   Procedure: PERIPHERAL VASCULAR THROMBECTOMY;  Surgeon: Algernon Huxley, MD;  Location: [facility];  Service: Cardiovascular;  Laterality: Left;   PORTACATH PLACEMENT Left 01/15/2019   Procedure: INSERTION PORT-A-CATH;  Surgeon: [name], MD;  Location: [facility];  Service: General;  Laterality: Left;   THORACOTOMY Right 08/13/2018   Procedure: PREOP BROCHOSCOPY WITH RIGHT THORACOTOMY AND RUL RESECTION;  Surgeon: [name], MD;  Location: [facility];  Service: General;  Laterality: Right;   THORACOTOMY Right 08/20/2018   Procedure: THORACOTOMY MAJOR RIGHT UPPER LOBE LOBECTOMY;  Surgeon: [name], MD;  Location: [facility];  Service: Thoracic;  Laterality: Right;   TOE SURGERY Left    pin in left toe    Family History  Problem Relation Age of Onset   Breast cancer  Mother    Diabetes Mother    Lung cancer Father    Hypertension Father     Social History:  reports that he quit smoking about 3 years ago. His smoking use included cigarettes. He has a 7.50 pack-year smoking history. He has never used smokeless tobacco. He reports current alcohol use of about 3.0 standard drinks of alcohol per week. He reports that he does not use drugs.  Allergies: No Known Allergies  Current Medications: Current Outpatient Medications  Medication Sig Dispense Refill   albuterol (VENTOLIN HFA) 108 (90 Base) MCG/ACT inhaler INHALE 2 PUFFS BY MOUTH EVERY 6 HOURS AS NEEDED FOR WHEEZE OR SHORTNESS OF BREATH 8.5 g 5   apixaban (ELIQUIS) 2.5 MG TABS tablet Take 1 tablet (2.5 mg total) by mouth 2 (two) times daily. 60 tablet 6   budesonide-formoterol (SYMBICORT) 80-4.5 MCG/ACT inhaler Inhale 2 puffs into the lungs 2 (two) times daily. in the morning and at bedtime. 10.2 each 12   dexamethasone (DECADRON) 4 MG tablet Take 2 tablets (8 mg total) by mouth 2 (two) times daily. Start the day before Taxotere. Then daily after chemo for 2 days. 30 tablet 1   diltiazem (CARDIZEM CD) 120 MG 24 hr capsule Take 120 mg by mouth daily.     latanoprost (XALATAN) 0.005 % ophthalmic solution SMARTSIG:In Eye(s)     loratadine (CLARITIN) 10 MG tablet Take 10 mg by mouth daily.     metoprolol succinate (TOPROL-XL) 25 MG 24 hr tablet Take 1 tablet (25 mg total) by mouth daily. 30 tablet 0   nystatin (MYCOSTATIN) 100000 UNIT/ML suspension Take 5 mLs (500,000 Units total) by mouth 4 (four) times daily. Swish and swallow. 473 mL 1   ondansetron (ZOFRAN) 8 MG tablet Take 1 tablet (8 mg total) by mouth every 8 (eight) hours as needed for refractory nausea / vomiting. Start on day 3 after  chemo. 90 tablet 1   pantoprazole (PROTONIX) 20 MG tablet Take 1 tablet (20 mg total) by mouth daily. 60 tablet 2   predniSONE (DELTASONE) 20 MG tablet Take 1 tablet (20 mg total) by mouth daily with breakfast. 7 tablet 0    SPIRIVA HANDIHALER 18 MCG inhalation capsule INHALE 1 CAPSULE VIA HANDIHALER ONCE DAILY AT THE SAME TIME EVERY DAY 30 capsule 0   vitamin B-12 (CYANOCOBALAMIN) 1000 MCG tablet Take 1 tablet (1,000 mcg total) by mouth daily. 90 tablet 1   folic acid (V-R FOLIC ACID) 540 MCG tablet Take 1 tablet (400 mcg total) by mouth daily. (Patient not taking: Reported on 12/16/2021) 90 tablet 1   HYDROcodone-acetaminophen (NORCO/VICODIN) 5-325 MG tablet Take 1 tablet by mouth every 8 (eight) hours as needed for severe pain. (Patient not taking: Reported on 12/16/2021) 30 tablet 0   loperamide (IMODIUM) 2 MG capsule Take 1 capsule (2 mg total) by mouth See admin instructions. Initial: 4 mg, followed by 2 mg after each loose stool; maximum: 16 mg/day (Patient not taking: Reported on 12/16/2021) 60 capsule 0   methocarbamol (ROBAXIN) 500 MG tablet Take 1 tablet (500 mg total) by mouth at bedtime. (Patient not taking: Reported on 12/16/2021) 30 tablet 0   olmesartan (BENICAR) 40 MG tablet Take 40 mg by mouth every other day.  (Patient not taking: Reported on 12/16/2021)     [START ON 01/10/2022] predniSONE (DELTASONE) 10 MG tablet Take 1 tablet (10 mg total) by mouth daily with breakfast. (Patient not taking: Reported on 01/05/2022) 7 tablet 0   [START ON 01/17/2022] predniSONE (DELTASONE) 5 MG tablet Take 1 tablet (5 mg total) by mouth daily with breakfast. (Patient not taking: Reported on 01/05/2022) 7 tablet 0   promethazine (PHENERGAN) 25 MG tablet Take 1 tablet (25 mg total) by mouth every 6 (six) hours as needed for nausea or vomiting. (Patient not taking: Reported on 12/30/2021) 30 tablet 0   No current facility-administered medications for this visit.   Facility-Administered Medications Ordered in Other Visits  Medication Dose Route Frequency Provider Last Rate Last Admin   dexamethasone (DECADRON) 10 MG/ML injection            heparin lock flush 100 unit/mL  500 Units Intravenous Once Earlie Server, MD       heparin lock  flush 100 unit/mL  500 Units Intravenous Once Earlie Server, MD       heparin lock flush 100 unit/mL  500 Units Intravenous Once Earlie Server, MD       sodium chloride flush (NS) 0.9 % injection 10 mL  10 mL Intravenous PRN Earlie Server, MD   10 mL at 04/04/19 0903   sodium chloride flush (NS) 0.9 % injection 10 mL  10 mL Intravenous PRN Earlie Server, MD   10 mL at 07/26/20 1308   sodium chloride flush (NS) 0.9 % injection 10 mL  10 mL Intravenous PRN Earlie Server, MD   10 mL at 07/28/21 0867       IMAGE STUDIES:  NM PET Image Restag (PS) Skull Base To Thigh  Result Date: 11/23/2021 CLINICAL DATA:  Subsequent treatment strategy for squamous cell lung cancer. Recent chemotherapy. Prior radiation. EXAM: NUCLEAR MEDICINE PET SKULL BASE TO THIGH TECHNIQUE: 7.1 mCi F-18 FDG was injected intravenously. Full-ring PET imaging was performed from the skull base to thigh after the radiotracer. CT data was obtained and used for attenuation correction and anatomic localization. Fasting blood glucose: 110 mg/dl COMPARISON:  CT chest abdomen  pelvis dated 11/15/2021 FINDINGS: Mediastinal blood pool activity: SUV max 1.4 Liver activity: SUV max NA NECK: No hypermetabolic cervical lymphadenopathy. Stable 9 mm lesion deep to the left parotid (series 2/image 31), max SUV 4.1, previously 4.6. Incidental CT findings: none CHEST: Status post right upper lobectomy. Radiation changes in the right hemithorax. 9 mm nodular opacity in the central left upper lobe (series 2/image 32), max SUV 1.7. This is enlarging on CT, raising concern for primary bronchogenic neoplasm or metastasis. 1.9 cm thick-walled cavitary lesion in the lateral right lower lobe (series 2/image 100), max SUV 4.5, but new from January 2023. Adjacent 3.3 cm thin-walled cavitary lesion in the anteromedial right lower lobe (series 2/image 105) is improved from January 2023 and non FDG avid. As such, infectious/inflammation is favored. No hypermetabolic thoracic lymphadenopathy.  Incidental CT findings: Better evaluated on recent dedicated CT chest. Mosaic attenuation in the lungs bilaterally. ABDOMEN/PELVIS: No abnormal hypermetabolism in the liver, spleen, pancreas, or adrenal glands. No hypermetabolic abdominopelvic lymphadenopathy. Multifocal hypermetabolism adjacent to the sigmoid colon, corresponding to the chronic diverticular abscesses noted on recent CT. Representative max SUV 5.6. Incidental CT findings: Better evaluated on recent dedicated CT chest. Vascular calcifications. Thick-walled bladder. Sigmoid diverticulosis. Prominent rectal stool burden. SKELETON: No focal hypermetabolic activity to suggest skeletal metastasis. Incidental CT findings: none IMPRESSION: 9 mm nodular opacity in the central left upper lobe demonstrates mild hypermetabolism and is enlarging on CT, raising concern for primary bronchogenic neoplasm or metastasis. 1.9 cm thick-walled cavitary lesion in the lateral right lower lobe is hypermetabolic but new from January 2023, favoring infection/inflammation. Adjacent 3.3 cm thin-walled cavitary lesion in the right lower lobe is improved and non FDG avid. Attention on follow-up is suggested. Status post right upper lobectomy with radiation changes in the right hemithorax. 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 Electronically Signed   By: Julian Hy M.D.   On: 11/23/2021 00:09   CT CHEST ABDOMEN PELVIS W CONTRAST  Result Date: 11/16/2021 CLINICAL DATA:  56 year old male with history of lung cancer status post surgical resection, chemotherapy and radiation therapy. Restaging examination. * Tracking Code: BO * EXAM: CT CHEST, ABDOMEN, AND PELVIS WITH CONTRAST TECHNIQUE: Multidetector CT imaging of the chest, abdomen and pelvis was performed following the standard protocol during bolus administration of intravenous contrast. RADIATION DOSE REDUCTION: This exam was performed according to the departmental  dose-optimization program which includes automated exposure control, adjustment of the mA and/or kV according to patient size and/or use of iterative reconstruction technique. CONTRAST:  123m OMNIPAQUE IOHEXOL 300 MG/ML  SOLN COMPARISON:  Multiple priors, most recently chest CT 07/15/2021. CT the chest, abdomen and pelvis 10/15/2020. FINDINGS: CT CHEST FINDINGS Cardiovascular: Heart size is borderline enlarged with prominence of the right ventricle. There is no significant pericardial fluid, thickening or pericardial calcification. There is aortic atherosclerosis, as well as atherosclerosis of the great vessels of the mediastinum and the coronary arteries, including calcified atherosclerotic plaque in the left circumflex coronary artery. Mediastinum/Nodes: No pathologically enlarged mediastinal or hilar lymph nodes. Esophagus is unremarkable in appearance. No axillary lymphadenopathy. Lungs/Pleura: Status post right upper lobectomy. Compensatory hyperexpansion of the right middle and lower lobes. Previously noted thick-walled cavitary lesion in the right lower lobe is less thick-walled on today's examination, and now is more of a simple appearing cyst (best appreciated on axial image 94 of series 6). However, there is a new irregular-shaped thick-walled cavitary lesion adjacent to this in the right lower lobe (axial image 87  of series 6) which currently measures 2.4 x 2.0 cm. 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 A few scattered pulmonary nodules are noted elsewhere in the lungs, most notably in the left upper lobe (axial image 40 of series 6 and coronal image 50 of series 4) where there is an ill-defined part solid and cystic lesion measuring 11 x 5 x 10 mm  (previously only 8 x 6 x 5 mm). Some clustered nodular architectural distortion is also noted in the base of the right lung, similar to the prior study, favored to reflect areas of chronic post infectious or inflammatory scarring. Trace right pleural effusion is chronic. No left pleural effusion. Mild diffuse bronchial wall thickening with mild centrilobular and paraseptal emphysema. Musculoskeletal: There are no aggressive appearing lytic or blastic lesions noted in the visualized portions of the skeleton. CT ABDOMEN PELVIS FINDINGS Hepatobiliary: No suspicious cystic or solid hepatic lesions. No intra or extrahepatic biliary ductal dilatation. Gallbladder is normal in appearance. Pancreas: No pancreatic mass. No pancreatic ductal dilatation. No pancreatic or peripancreatic fluid collections or inflammatory changes. Spleen: Unremarkable. Adrenals/Urinary Tract: Subcentimeter low-attenuation lesions in the lower pole of the left kidney, too small to characterize, but statistically likely small cysts (no imaging follow-up is recommended). No other suspicious renal lesions. No hydroureteronephrosis. Bilateral adrenal glands are normal in appearance. Along the superior aspect of the urinary bladder wall (slightly to the left of midline) there is a 2.6 x 1.4 x 1.7 cm rim enhancing centrally low-attenuation structure which contains a combination of fluid and gas which appears to be intramural within the bladder wall, but is also intimately associated with the adjacent sigmoid colon which has numerous colonic diverticulae are. No definite gas within the lumen of the urinary bladder. Stomach/Bowel: The appearance of the stomach is normal. No pathologic dilatation of small bowel or colon. Numerous colonic diverticulae are noted, particularly in the sigmoid colon where there are 2 low-attenuation rim enhancing structures compatible with diverticular abscesses, 1 of which appears intimately associated with the adjacent  urinary bladder wall where this appears to be intramural within the wall of the bladder (discussed above). An adjacent lesion is noted along the left pelvic sidewall in the mid sigmoid colon (axial image 101 of series 2 and coronal image 69 of series 4) measuring 2.3 x 1.6 x 2.2 cm. Another large gas, fluid and rim enhancing structure extends off the superior aspect of the mid sigmoid colon (axial image 93 of series 2 and coronal image 54 of series 4). Other smaller potential diverticular abscess also noted on axial image 97 of series 2 measuring 1.6 x 1.2 cm. There does not appear to be extensive inflammation in the pericolonic fat at this time. Vascular/Lymphatic: Aortic atherosclerosis, without evidence of aneurysm or dissection in the abdominal or pelvic vasculature. No lymphadenopathy noted in the abdomen or pelvis. Reproductive: Prostate gland and seminal vesicles are unremarkable in appearance. Other: No significant volume of ascites.  No pneumoperitoneum. Musculoskeletal: 8 mm of anterolisthesis of L5 upon S1. There are no aggressive appearing lytic or blastic lesions noted in the visualized portions of the skeleton. IMPRESSION: 1. The previously noted thick-walled cavity of concern in the right lower lobe now appears more simple and thin-walled in appearance, presumably a post infectious cavity. There is a new adjacent thick-walled cavity in  the right lower lobe which is aggressive in appearance, but given the evolution of the previous lesion, this may also be of infectious etiology. 2. However, there is an enlarging mixed cystic and solid nodule in the left upper lobe which is concerning for metastatic lesion or new primary lesion. This currently measures 11 x 5 x 10 mm. Close attention on follow-up studies is recommended. 3. No definite signs of metastatic disease to the abdomen or pelvis. 4. Extensive colonic diverticulosis most involved in the sigmoid colon where there appear to be several small  diverticular abscesses. 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 Notably, one of these appears to fistulized into the wall of the urinary bladder, without frank fistulization to the lumen of the urinary bladder at this time. Further clinical evaluation, and nonemergent urology consultation in the near future is suggested for future management. 5. Mild diffuse bronchial wall thickening with mild centrilobular and paraseptal emphysema; imaging findings suggestive of underlying COPD. 6. Trace right pleural effusion (chronic and unchanged). 7. Aortic atherosclerosis, in addition to left circumflex coronary artery disease. Please note that although the presence of coronary artery calcium documents the presence of coronary artery disease, the severity of this disease and any potential stenosis cannot be assessed on this non-gated CT examination. Assessment for potential risk factor modification, dietary therapy or pharmacologic therapy may be warranted, if clinically indicated. 8. Additional incidental findings, as above. Electronically Signed   By: Vinnie Langton M.D.   On: 11/16/2021 08:55     ASSESSMENT & PLAN:   Assessment & Plan: Squamous cell carcinoma of right lung (HCC) 07/16/2020-09/28/2020 carboplatin Taxol and Keytruda x 1  partial response -Keytruda maintenance.-04/22/2021, progression-resumed on carboplatin/Taxol/Keytruda - did not tolerate due to myelosuppression.-->  Taxol held, carboplatin/Keytruda--> CT 07/2020 progression --> 08/03/2021 started docetaxel and ramucirumab.--> s/p D1C4 on 12/30/21 with GCSF support on Day 4. Tolerating well.   Neutropenia S/p GCSF. Reviewed neutropenic precautions. He will monitor temperature at home. Hold antibiotics at this time given GCSF administration, I expect counts will rebound.   History of thrombosis Unprovoked left lower extremity DVT, status post thrombolysis/thrombectomy, Nonocclusive  PE Patient has completed 6+ months of therapeutic dose of Eliquis.  Continue Eliquis 2.5 mg p.o. twice daily.   Diverticulosis of colon Remains asymptomatic   Severe protein-calorie malnutrition ([place]) Encourage patient to follow-up with nutritionist   Hypokalemia Continue potassium supplementation  Disposition: IVF today as planned Follow up as planned- [place]  The patient understands the plans discussed today and is in agreement with them.  He knows to contact our office if he develops concerns prior to his next appointment.  No orders of the defined types were placed in this encounter.   Beckey Rutter, DNP, AGNP-C [place] at [facility] [phone] (clinic)

## 2022-01-13 ENCOUNTER — Inpatient Hospital Stay: Payer: 59 | Attending: Oncology

## 2022-01-13 ENCOUNTER — Inpatient Hospital Stay: Payer: 59

## 2022-01-13 VITALS — BP 134/85 | HR 110

## 2022-01-13 DIAGNOSIS — Z5111 Encounter for antineoplastic chemotherapy: Secondary | ICD-10-CM | POA: Insufficient documentation

## 2022-01-13 DIAGNOSIS — E43 Unspecified severe protein-calorie malnutrition: Secondary | ICD-10-CM | POA: Diagnosis not present

## 2022-01-13 DIAGNOSIS — Z86711 Personal history of pulmonary embolism: Secondary | ICD-10-CM | POA: Insufficient documentation

## 2022-01-13 DIAGNOSIS — Z79899 Other long term (current) drug therapy: Secondary | ICD-10-CM | POA: Insufficient documentation

## 2022-01-13 DIAGNOSIS — E876 Hypokalemia: Secondary | ICD-10-CM | POA: Diagnosis not present

## 2022-01-13 DIAGNOSIS — Z86718 Personal history of other venous thrombosis and embolism: Secondary | ICD-10-CM | POA: Insufficient documentation

## 2022-01-13 DIAGNOSIS — Z7952 Long term (current) use of systemic steroids: Secondary | ICD-10-CM | POA: Insufficient documentation

## 2022-01-13 DIAGNOSIS — C3411 Malignant neoplasm of upper lobe, right bronchus or lung: Secondary | ICD-10-CM | POA: Diagnosis present

## 2022-01-13 DIAGNOSIS — D6481 Anemia due to antineoplastic chemotherapy: Secondary | ICD-10-CM | POA: Insufficient documentation

## 2022-01-13 DIAGNOSIS — Z7951 Long term (current) use of inhaled steroids: Secondary | ICD-10-CM | POA: Insufficient documentation

## 2022-01-13 DIAGNOSIS — Z7901 Long term (current) use of anticoagulants: Secondary | ICD-10-CM | POA: Diagnosis not present

## 2022-01-13 DIAGNOSIS — R7989 Other specified abnormal findings of blood chemistry: Secondary | ICD-10-CM | POA: Diagnosis not present

## 2022-01-13 DIAGNOSIS — E860 Dehydration: Secondary | ICD-10-CM

## 2022-01-13 DIAGNOSIS — Z87891 Personal history of nicotine dependence: Secondary | ICD-10-CM | POA: Diagnosis not present

## 2022-01-13 DIAGNOSIS — Z5112 Encounter for antineoplastic immunotherapy: Secondary | ICD-10-CM | POA: Insufficient documentation

## 2022-01-13 DIAGNOSIS — T451X5A Adverse effect of antineoplastic and immunosuppressive drugs, initial encounter: Secondary | ICD-10-CM | POA: Diagnosis not present

## 2022-01-13 DIAGNOSIS — C3491 Malignant neoplasm of unspecified part of right bronchus or lung: Secondary | ICD-10-CM

## 2022-01-13 LAB — COMPREHENSIVE METABOLIC PANEL
ALT: 12 U/L (ref 0–44)
AST: 21 U/L (ref 15–41)
Albumin: 3.2 g/dL — ABNORMAL LOW (ref 3.5–5.0)
Alkaline Phosphatase: 127 U/L — ABNORMAL HIGH (ref 38–126)
Anion gap: 12 (ref 5–15)
BUN: 14 mg/dL (ref 6–20)
CO2: 24 mmol/L (ref 22–32)
Calcium: 8.5 mg/dL — ABNORMAL LOW (ref 8.9–10.3)
Chloride: 98 mmol/L (ref 98–111)
Creatinine, Ser: 0.77 mg/dL (ref 0.61–1.24)
GFR, Estimated: >60 mL/min (ref >60)
Glucose, Bld: 132 mg/dL — ABNORMAL HIGH (ref 70–99)
Potassium: 2.8 mmol/L — ABNORMAL LOW (ref 3.5–5.1)
Sodium: 134 mmol/L — ABNORMAL LOW (ref 135–145)
Total Bilirubin: 1 mg/dL (ref 0.3–1.2)
Total Protein: 6.8 g/dL (ref 6.5–8.1)

## 2022-01-13 LAB — CBC WITH DIFFERENTIAL/PLATELET
Abs Immature Granulocytes: 0.19 10*3/uL — ABNORMAL HIGH (ref 0.00–0.07)
Basophils Absolute: 0.1 10*3/uL (ref 0.0–0.1)
Basophils Relative: 0 %
Eosinophils Absolute: 0 10*3/uL (ref 0.0–0.5)
Eosinophils Relative: 0 %
HCT: 36.6 % — ABNORMAL LOW (ref 39.0–52.0)
Hemoglobin: 12.7 g/dL — ABNORMAL LOW (ref 13.0–17.0)
Immature Granulocytes: 1 %
Lymphocytes Relative: 6 %
Lymphs Abs: 1.6 10*3/uL (ref 0.7–4.0)
MCH: 35.8 pg — ABNORMAL HIGH (ref 26.0–34.0)
MCHC: 34.7 g/dL (ref 30.0–36.0)
MCV: 103.1 fL — ABNORMAL HIGH (ref 80.0–100.0)
Monocytes Absolute: 1.4 10*3/uL — ABNORMAL HIGH (ref 0.1–1.0)
Monocytes Relative: 5 %
Neutro Abs: 23.2 10*3/uL — ABNORMAL HIGH (ref 1.7–7.7)
Neutrophils Relative %: 88 %
Platelets: 123 10*3/uL — ABNORMAL LOW (ref 150–400)
RBC: 3.55 MIL/uL — ABNORMAL LOW (ref 4.22–5.81)
RDW: 14.6 % (ref 11.5–15.5)
WBC: 26.6 10*3/uL — ABNORMAL HIGH (ref 4.0–10.5)
nRBC: 0.4 % — ABNORMAL HIGH (ref 0.0–0.2)

## 2022-01-13 LAB — MAGNESIUM: Magnesium: 1.6 mg/dL — ABNORMAL LOW (ref 1.7–2.4)

## 2022-01-13 MED ORDER — POTASSIUM CHLORIDE 20 MEQ/100ML IV SOLN
20.0000 meq | Freq: Once | INTRAVENOUS | Status: AC
  Administered 2022-01-13: 20 meq via INTRAVENOUS

## 2022-01-13 MED ORDER — HEPARIN SOD (PORK) LOCK FLUSH 100 UNIT/ML IV SOLN
500.0000 [IU] | Freq: Once | INTRAVENOUS | Status: AC
  Administered 2022-01-13: 500 [IU] via INTRAVENOUS
  Filled 2022-01-13: qty 5

## 2022-01-13 MED ORDER — SODIUM CHLORIDE 0.9% FLUSH
10.0000 mL | Freq: Once | INTRAVENOUS | Status: AC
  Administered 2022-01-13: 10 mL via INTRAVENOUS
  Filled 2022-01-13: qty 10

## 2022-01-13 MED ORDER — SODIUM CHLORIDE 0.9 % IV SOLN
Freq: Once | INTRAVENOUS | Status: AC
  Filled 2022-01-13: qty 250

## 2022-01-13 NOTE — Progress Notes (Signed)
Pt doing well. Appetite is good. Neuropathy is new in his toes. Bowels normal. Denies any pain. Energy is rated as fair.

## 2022-01-19 ENCOUNTER — Other Ambulatory Visit: Payer: Self-pay | Admitting: Oncology

## 2022-01-24 MED FILL — Dexamethasone Sodium Phosphate Inj 100 MG/10ML: INTRAMUSCULAR | Qty: 1 | Status: AC

## 2022-01-25 ENCOUNTER — Inpatient Hospital Stay: Payer: 59

## 2022-01-25 ENCOUNTER — Other Ambulatory Visit: Payer: Self-pay

## 2022-01-25 ENCOUNTER — Inpatient Hospital Stay (HOSPITAL_BASED_OUTPATIENT_CLINIC_OR_DEPARTMENT_OTHER): Payer: 59 | Admitting: Oncology

## 2022-01-25 ENCOUNTER — Encounter: Payer: Self-pay | Admitting: Oncology

## 2022-01-25 DIAGNOSIS — Z5111 Encounter for antineoplastic chemotherapy: Secondary | ICD-10-CM | POA: Diagnosis not present

## 2022-01-25 DIAGNOSIS — K5730 Diverticulosis of large intestine without perforation or abscess without bleeding: Secondary | ICD-10-CM | POA: Diagnosis not present

## 2022-01-25 DIAGNOSIS — C3491 Malignant neoplasm of unspecified part of right bronchus or lung: Secondary | ICD-10-CM

## 2022-01-25 DIAGNOSIS — D6481 Anemia due to antineoplastic chemotherapy: Secondary | ICD-10-CM

## 2022-01-25 DIAGNOSIS — E43 Unspecified severe protein-calorie malnutrition: Secondary | ICD-10-CM

## 2022-01-25 DIAGNOSIS — Z5112 Encounter for antineoplastic immunotherapy: Secondary | ICD-10-CM | POA: Diagnosis not present

## 2022-01-25 DIAGNOSIS — Z86718 Personal history of other venous thrombosis and embolism: Secondary | ICD-10-CM

## 2022-01-25 DIAGNOSIS — R7989 Other specified abnormal findings of blood chemistry: Secondary | ICD-10-CM

## 2022-01-25 DIAGNOSIS — T451X5A Adverse effect of antineoplastic and immunosuppressive drugs, initial encounter: Secondary | ICD-10-CM

## 2022-01-25 LAB — CBC WITH DIFFERENTIAL/PLATELET
Abs Immature Granulocytes: 0.11 10*3/uL — ABNORMAL HIGH (ref 0.00–0.07)
Basophils Absolute: 0 10*3/uL (ref 0.0–0.1)
Basophils Relative: 0 %
Eosinophils Absolute: 0 10*3/uL (ref 0.0–0.5)
Eosinophils Relative: 0 %
HCT: 34.9 % — ABNORMAL LOW (ref 39.0–52.0)
Hemoglobin: 11.7 g/dL — ABNORMAL LOW (ref 13.0–17.0)
Immature Granulocytes: 1 %
Lymphocytes Relative: 4 %
Lymphs Abs: 0.8 10*3/uL (ref 0.7–4.0)
MCH: 36 pg — ABNORMAL HIGH (ref 26.0–34.0)
MCHC: 33.5 g/dL (ref 30.0–36.0)
MCV: 107.4 fL — ABNORMAL HIGH (ref 80.0–100.0)
Monocytes Absolute: 0.5 10*3/uL (ref 0.1–1.0)
Monocytes Relative: 3 %
Neutro Abs: 16.5 10*3/uL — ABNORMAL HIGH (ref 1.7–7.7)
Neutrophils Relative %: 92 %
Platelets: 142 10*3/uL — ABNORMAL LOW (ref 150–400)
RBC: 3.25 MIL/uL — ABNORMAL LOW (ref 4.22–5.81)
RDW: 15.9 % — ABNORMAL HIGH (ref 11.5–15.5)
WBC: 18 10*3/uL — ABNORMAL HIGH (ref 4.0–10.5)
nRBC: 0.2 % (ref 0.0–0.2)

## 2022-01-25 LAB — COMPREHENSIVE METABOLIC PANEL
ALT: 11 U/L (ref 0–44)
AST: 33 U/L (ref 15–41)
Albumin: 3.2 g/dL — ABNORMAL LOW (ref 3.5–5.0)
Alkaline Phosphatase: 119 U/L (ref 38–126)
Anion gap: 11 (ref 5–15)
BUN: 16 mg/dL (ref 6–20)
CO2: 21 mmol/L — ABNORMAL LOW (ref 22–32)
Calcium: 8.7 mg/dL — ABNORMAL LOW (ref 8.9–10.3)
Chloride: 104 mmol/L (ref 98–111)
Creatinine, Ser: 1.28 mg/dL — ABNORMAL HIGH (ref 0.61–1.24)
GFR, Estimated: >60 mL/min (ref >60)
Glucose, Bld: 137 mg/dL — ABNORMAL HIGH (ref 70–99)
Potassium: 4.6 mmol/L (ref 3.5–5.1)
Sodium: 136 mmol/L (ref 135–145)
Total Bilirubin: 0.5 mg/dL (ref 0.3–1.2)
Total Protein: 6.8 g/dL (ref 6.5–8.1)

## 2022-01-25 LAB — PROTEIN, URINE, RANDOM: Total Protein, Urine: 44 mg/dL

## 2022-01-25 LAB — HEMOGLOBIN A1C
Hgb A1c MFr Bld: 4.8 % (ref 4.8–5.6)
Mean Plasma Glucose: 91.06 mg/dL

## 2022-01-25 LAB — LIPID PANEL
Cholesterol: 122 mg/dL (ref 0–200)
HDL: 85 mg/dL (ref >40)
LDL Cholesterol: 25 mg/dL (ref 0–99)
Total CHOL/HDL Ratio: 1.4 RATIO
Triglycerides: 61 mg/dL (ref <150)
VLDL: 12 mg/dL (ref 0–40)

## 2022-01-25 MED ORDER — SODIUM CHLORIDE 0.9 % IV SOLN
10.0000 mg | Freq: Once | INTRAVENOUS | Status: AC
  Administered 2022-01-25: 10 mg via INTRAVENOUS
  Filled 2022-01-25: qty 10

## 2022-01-25 MED ORDER — SODIUM CHLORIDE 0.9% FLUSH
10.0000 mL | INTRAVENOUS | Status: DC | PRN
  Administered 2022-01-25: 10 mL
  Filled 2022-01-25: qty 10

## 2022-01-25 MED ORDER — SODIUM CHLORIDE 0.9 % IV SOLN
10.0000 mg/kg | Freq: Once | INTRAVENOUS | Status: AC
  Administered 2022-01-25: 600 mg via INTRAVENOUS
  Filled 2022-01-25: qty 10

## 2022-01-25 MED ORDER — DIPHENHYDRAMINE HCL 50 MG/ML IJ SOLN
50.0000 mg | Freq: Once | INTRAMUSCULAR | Status: AC
  Administered 2022-01-25: 50 mg via INTRAVENOUS
  Filled 2022-01-25: qty 1

## 2022-01-25 MED ORDER — PREDNISONE 1 MG PO TABS: 1.0000 mg | ORAL_TABLET | ORAL | 0 refills | Status: DC

## 2022-01-25 MED ORDER — HEPARIN SOD (PORK) LOCK FLUSH 100 UNIT/ML IV SOLN
500.0000 [IU] | Freq: Once | INTRAVENOUS | Status: AC | PRN
  Administered 2022-01-25: 500 [IU]
  Filled 2022-01-25: qty 5

## 2022-01-25 MED ORDER — SODIUM CHLORIDE 0.9 % IV SOLN
Freq: Once | INTRAVENOUS | Status: AC
  Filled 2022-01-25: qty 250

## 2022-01-25 MED ORDER — METHOCARBAMOL 500 MG PO TABS: 500.0000 mg | ORAL_TABLET | Freq: Every day | ORAL | 0 refills | Status: DC

## 2022-01-25 MED ORDER — SODIUM CHLORIDE 0.9 % IV SOLN
75.0000 mg/m2 | Freq: Once | INTRAVENOUS | Status: AC
  Administered 2022-01-25: 130 mg via INTRAVENOUS
  Filled 2022-01-25: qty 13

## 2022-01-25 MED ORDER — ACETAMINOPHEN 325 MG PO TABS
650.0000 mg | ORAL_TABLET | Freq: Once | ORAL | Status: AC
  Administered 2022-01-25: 650 mg via ORAL
  Filled 2022-01-25: qty 2

## 2022-01-25 NOTE — Patient Instructions (Signed)
[facility] AT [place]  Discharge Instructions: Thank you for choosing [facility] to provide your oncology and hematology care.  If you have a lab appointment with the [facility], please go directly to the [facility] and check in at the registration area.  Wear comfortable clothing and clothing appropriate for easy access to any Portacath or PICC line.   We strive to give you quality time with your provider. You may need to reschedule your appointment if you arrive late (15 or more minutes).  Arriving late affects you and other patients whose appointments are after yours.  Also, if you miss three or more appointments without notifying the office, you may be dismissed from the clinic at the provider's discretion.      For prescription refill requests, have your pharmacy contact our office and allow 72 hours for refills to be completed.    Today you received the following chemotherapy and/or immunotherapy agents taxotere, cyramza      To help prevent nausea and vomiting after your treatment, we encourage you to take your nausea medication as directed.  BELOW ARE SYMPTOMS THAT SHOULD BE REPORTED IMMEDIATELY: *FEVER GREATER THAN 100.4 F (38 C) OR HIGHER *CHILLS OR SWEATING *NAUSEA AND VOMITING THAT IS NOT CONTROLLED WITH YOUR NAUSEA MEDICATION *UNUSUAL SHORTNESS OF BREATH *UNUSUAL BRUISING OR BLEEDING *URINARY PROBLEMS (pain or burning when urinating, or frequent urination) *BOWEL PROBLEMS (unusual diarrhea, constipation, pain near the anus) TENDERNESS IN MOUTH AND THROAT WITH OR WITHOUT PRESENCE OF ULCERS (sore throat, sores in mouth, or a toothache) UNUSUAL RASH, SWELLING OR PAIN  UNUSUAL VAGINAL DISCHARGE OR ITCHING   Items with * indicate a potential emergency and should be followed up as soon as possible or go to the Emergency Department if any problems should occur.  Please show the CHEMOTHERAPY ALERT CARD or IMMUNOTHERAPY ALERT CARD at  check-in to the Emergency Department and triage nurse.  Should you have questions after your visit or need to cancel or reschedule your appointment, please contact [facility] [place] AT [place]  [phone] and follow the prompts.  Office hours are 8:00 a.m. to 4:30 p.m. Monday - Friday. Please note that voicemails left after 4:00 p.m. may not be returned until the following business day.  We are closed weekends and major holidays. You have access to a nurse at all times for urgent questions. Please call the main number to the clinic [phone] and follow the prompts.  For any non-urgent questions, you may also contact your provider using MyChart. We now offer e-Visits for anyone 22 and older to request care online for non-urgent symptoms. For details visit [email or web address].   Also download the MyChart app! Go to the app store, search "MyChart", open the app, select [facility], and log in with your MyChart username and password.  Masks are optional in the cancer centers. If you would like for your care team to wear a mask while they are taking care of you, please let them know. For doctor visits, patients may have with them one support person who is at least 56 years old. At this time, visitors are not allowed in the infusion area.  Ramucirumab injection What is this medication? RAMUCIRUMAB (ra mue SIR ue mab) is a monoclonal antibody. It is used to treat stomach cancer, colorectal cancer, liver cancer, and lung cancer. This medicine may be used for other purposes; ask your health care provider or pharmacist if you have questions. COMMON BRAND NAME(S): Cyramza What should  I tell my care team before I take this medication? They need to know if you have any of these conditions: bleeding disorders blood clots heart disease, including heart failure, heart attack, or chest pain (angina) high blood pressure infection (especially a virus infection such as chickenpox, cold  sores, or herpes) protein in your urine recent or planning to have surgery stroke an unusual or allergic reaction to ramucirumab, other medicines, foods, dyes, or preservatives pregnant or trying to get pregnant breast-feeding How should I use this medication? This medicine is for infusion into a vein. It is given by a health care professional in a hospital or clinic setting. Talk to your pediatrician regarding the use of this medicine in children. Special care may be needed. Overdosage: If you think you have taken too much of this medicine contact a poison control center or emergency room at once. NOTE: This medicine is only for you. Do not share this medicine with others. What if I miss a dose? It is important not to miss your dose. Call your doctor or health care professional if you are unable to keep an appointment. What may interact with this medication? Interactions have not been studied. This list may not describe all possible interactions. Give your health care provider a list of all the medicines, herbs, non-prescription drugs, or dietary supplements you use. Also tell them if you smoke, drink alcohol, or use illegal drugs. Some items may interact with your medicine. What should I watch for while using this medication? Your condition will be monitored carefully while you are receiving this medicine. You will need to to check your blood pressure and have your blood and urine tested while you are taking this medicine. Your condition will be monitored carefully while you are receiving this medicine. This medicine may increase your risk to bruise or bleed. Call your doctor or health care professional if you notice any unusual bleeding. Before having surgery, talk to your health care provider to make sure it is ok. This drug can increase the risk of poor healing of your surgical site or wound. You will need to stop this drug for 28 days before surgery. After surgery, wait at least 2 weeks  before restarting this drug. Make sure the surgical site or wound is healed enough before restarting this drug. Talk to your health care provider if questions. Do not become pregnant while taking this medicine or for 3 months after stopping it. Women should inform their doctor if they wish to become pregnant or think they might be pregnant. There is a potential for serious side effects to an unborn child. Talk to your health care professional or pharmacist for more information. Do not breast-feed an infant while taking this medicine or for 2 months after stopping it. This medicine may interfere with the ability to have a child. Talk with your doctor or health care professional if you are concerned about your fertility. What side effects may I notice from receiving this medication? Side effects that you should report to your doctor or health care professional as soon as possible: allergic reactions like skin rash, itching or hives, breathing problems, swelling of the face, lips, or tongue signs of infection - fever or chills, cough, sore throat chest pain or chest tightness confusion dizziness feeling faint or lightheaded, falls severe abdominal pain severe nausea, vomiting signs and symptoms of bleeding such as bloody or black, tarry stools; red or dark-brown urine; spitting up blood or brown material that looks like coffee grounds;  red spots on the skin; unusual bruising or bleeding from the eye, gums, or nose signs and symptoms of a blood clot such as breathing problems; changes in vision; chest pain; severe, sudden headache; pain, swelling, warmth in the leg; trouble speaking; sudden numbness or weakness of the face, arm or leg symptoms of a stroke: change in mental awareness, inability to talk or move one side of the body trouble walking, dizziness, loss of balance or coordination Side effects that usually do not require medical attention (report to your doctor or health care professional if  they continue or are bothersome): cold, clammy skin constipation diarrhea headache nausea, vomiting stomach pain unusually slow heartbeat unusually weak or tired This list may not describe all possible side effects. Call your doctor for medical advice about side effects. You may report side effects to FDA at 1-800-FDA-1088. Where should I keep my medication? This drug is given in a hospital or clinic and will not be stored at home. NOTE: This sheet is a summary. It may not cover all possible information. If you have questions about this medicine, talk to your doctor, pharmacist, or health care provider.  2023 Elsevier/Gold Standard (2021-05-27 00:00:00) Docetaxel injection What is this medication? DOCETAXEL (doe se TAX el) is a chemotherapy drug. It targets fast dividing cells, like cancer cells, and causes these cells to die. This medicine is used to treat many types of cancers like breast cancer, certain stomach cancers, head and neck cancer, lung cancer, and prostate cancer. This medicine may be used for other purposes; ask your health care provider or pharmacist if you have questions. COMMON BRAND NAME(S): Docefrez, Taxotere What should I tell my care team before I take this medication? They need to know if you have any of these conditions: infection (especially a virus infection such as chickenpox, cold sores, or herpes) liver disease low blood counts, like low white cell, platelet, or red cell counts an unusual or allergic reaction to docetaxel, polysorbate 80, other chemotherapy agents, other medicines, foods, dyes, or preservatives pregnant or trying to get pregnant breast-feeding How should I use this medication? This drug is given as an infusion into a vein. It is administered in a hospital or clinic by a specially trained health care professional. Talk to your pediatrician regarding the use of this medicine in children. Special care may be needed. Overdosage: If you think  you have taken too much of this medicine contact a poison control center or emergency room at once. NOTE: This medicine is only for you. Do not share this medicine with others. What if I miss a dose? It is important not to miss your dose. Call your doctor or health care professional if you are unable to keep an appointment. What may interact with this medication? Do not take this medicine with any of the following medications: live virus vaccines This medicine may also interact with the following medications: aprepitant certain antibiotics like erythromycin or clarithromycin certain antivirals for HIV or hepatitis certain medicines for fungal infections like fluconazole, itraconazole, ketoconazole, posaconazole, or voriconazole cimetidine ciprofloxacin conivaptan cyclosporine dronedarone fluvoxamine grapefruit juice imatinib verapamil This list may not describe all possible interactions. Give your health care provider a list of all the medicines, herbs, non-prescription drugs, or dietary supplements you use. Also tell them if you smoke, drink alcohol, or use illegal drugs. Some items may interact with your medicine. What should I watch for while using this medication? Your condition will be monitored carefully while you are receiving this medicine.  You will need important blood work done while you are taking this medicine. Call your doctor or health care professional for advice if you get a fever, chills or sore throat, or other symptoms of a cold or flu. Do not treat yourself. This drug decreases your body's ability to fight infections. Try to avoid being around people who are sick. Some products may contain alcohol. Ask your health care professional if this medicine contains alcohol. Be sure to tell all health care professionals you are taking this medicine. Certain medicines, like metronidazole and disulfiram, can cause an unpleasant reaction when taken with alcohol. The reaction includes  flushing, headache, nausea, vomiting, sweating, and increased thirst. The reaction can last from 30 minutes to several hours. You may get drowsy or dizzy. Do not drive, use machinery, or do anything that needs mental alertness until you know how this medicine affects you. Do not stand or sit up quickly, especially if you are an older patient. This reduces the risk of dizzy or fainting spells. Alcohol may interfere with the effect of this medicine. Talk to your health care professional about your risk of cancer. You may be more at risk for certain types of cancer if you take this medicine. Do not become pregnant while taking this medicine or for 6 months after stopping it. Women should inform their doctor if they wish to become pregnant or think they might be pregnant. There is a potential for serious side effects to an unborn child. Talk to your health care professional or pharmacist for more information. Do not breast-feed an infant while taking this medicine or for 1 week after stopping it. Males who get this medicine must use a condom during sex with females who can get pregnant. If you get a woman pregnant, the baby could have birth defects. The baby could die before they are born. You will need to continue wearing a condom for 3 months after stopping the medicine. Tell your health care provider right away if your partner becomes pregnant while you are taking this medicine. This may interfere with the ability to father a child. You should talk to your doctor or health care professional if you are concerned about your fertility. What side effects may I notice from receiving this medication? Side effects that you should report to your doctor or health care professional as soon as possible: allergic reactions like skin rash, itching or hives, swelling of the face, lips, or tongue blurred vision breathing problems changes in vision low blood counts - This drug may decrease the number of white blood  cells, red blood cells and platelets. You may be at increased risk for infections and bleeding. nausea and vomiting pain, redness or irritation at site where injected pain, tingling, numbness in the hands or feet redness, blistering, peeling, or loosening of the skin, including inside the mouth signs of decreased platelets or bleeding - bruising, pinpoint red spots on the skin, black, tarry stools, nosebleeds signs of decreased red blood cells - unusually weak or tired, fainting spells, lightheadedness signs of infection - fever or chills, cough, sore throat, pain or difficulty passing urine swelling of the ankle, feet, hands Side effects that usually do not require medical attention (report to your doctor or health care professional if they continue or are bothersome): constipation diarrhea fingernail or toenail changes hair loss loss of appetite mouth sores muscle pain This list may not describe all possible side effects. Call your doctor for medical advice about side effects. You may  report side effects to FDA at 1-800-FDA-1088. Where should I keep my medication? This drug is given in a hospital or clinic and will not be stored at home. NOTE: This sheet is a summary. It may not cover all possible information. If you have questions about this medicine, talk to your doctor, pharmacist, or health care provider.  2023 Elsevier/Gold Standard (2021-05-27 00:00:00)

## 2022-01-25 NOTE — Progress Notes (Signed)
Pt here for follow up. NO new concerns. He is requesting refill on methocarbam0l.

## 2022-01-25 NOTE — Assessment & Plan Note (Signed)
Unprovoked left lower extremity DVT, status post thrombolysis/thrombectomy, Nonocclusive PE Patient has completed 6+ months of therapeutic dose of Eliquis.  Continue Eliquis 2.5 mg p.o. twice daily.

## 2022-01-25 NOTE — Progress Notes (Signed)
Hematology/Oncology Progress note [phone] [phone]     Clinic Day:  01/25/2022   Referring physician: Tracie Harrier, MD  ASSESSMENT & PLAN:    Squamous cell carcinoma of right lung ([place]) 07/16/2020-09/28/2020 carboplatin Taxol and Keytruda x 1  partial response -Keytruda maintenance.-04/22/2021, progression-resumed on carboplatin/Taxol/Keytruda - did not tolerate due to myelosuppression.-->  Taxol held, carboplatin/Keytruda--> CT 07/2020 progression --> 08/03/2021 started docetaxel and ramucirumab.--> Labs are reviewed and discussed with patient. Proceed with Docetaxel and Ramucirumab.   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    Encounter for antineoplastic chemotherapy Chemotherapy as above planned.   Severe protein-calorie malnutrition ([place] Lakes[place] previously refuses nutrition supplements. Encourage patient to take 1-2 nutritionist daly Encourage patient to follow-up with nutritionist  History of thrombosis Unprovoked left lower extremity DVT, status post thrombolysis/thrombectomy, Nonocclusive PE Patient has completed 6+ months of therapeutic dose of Eliquis.  Continue Eliquis 2.5 mg p.o. twice daily.  Antineoplastic chemotherapy induced anemia Hemoglobin is stable. monitor  Elevated serum creatinine Likely due to decreased oral intake IVF NS x 1L this week.  Weekly IVF NS  Follow-up Lab MD 3 weeks Docetaxel and ramucirumab. D4 GCSF  The patient understands the plans discussed today and is in agreement with them.  Samuel Choi knows to contact our office if Samuel Choi develops concerns prior to his next appointment.  Orders Placed This Encounter  Procedures   Lipid panel    Standing Status:   Future    Number of Occurrences:   1    Standing Expiration Date:   01/25/2023   Hemoglobin A1c    Standing Status:   Future    Number of Occurrences:   1     Standing Expiration Date:   01/25/2023    ZhEarlie ServerMD, PhD [facility] 01/25/2022    CHIEF COMPLAINT:  CC: Follow up for lung cancer treatments.   Current Treatment:  Docetaxel Ramucizumab  HISTORY OF PRESENT ILLNESS:   Oncology History  Squamous [place] carcinoma of right lung ([place] 08/13/[place] Surgery   s/p Bronchoscopy on 06/25/2018. subcarina EBUS FNA was non diagnostic, hypocellular specimen.  08/13/18 Patient underwent right thoracotomy and wedge resection of a right upper lobe mass.  Frozen section was consistent with an inflammatory process.  On the second postop day, preliminary pathology reports high-grade malignancy.  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  08/20/2018 Patient was therefore taken back to the OR and underwent take complete lobectomy  pT1b pN0 cM0 stage I squamous cell lung cancer. Margin is negative.  Recommend observation.     08/13/2018 Initial Diagnosis   Stage I Squamous cell carcinoma of lung, right ([place]  12/03/2018 Progression   12/03/2018 CT chest showed abnormal soft tissue in the right suprahilar region measuring up to 2.5 cm, worrisome for recurrence 6/9/4/7654ET Hypermetabolic RIGHT suprahilar mass consistent with lung cancer, recurrence. 12/26/2018 s/p bronchoscopy biopsy. Confirmed local recurrence of squamous cell lung cancer.  medi port placed by Dr.Oaks. 01/20/19 MRI brain w/wo contrast: No evidence of metastatic disease   01/17/2019 - 03/07/2019 Chemotherapy   concurrent chemoradiation on 01/16/2019 Carboplatin AUC of 2 and Taxol 45 mg/m2 weekly finished in 03/05/2019    03/31/2019 Imaging   03/31/19 CT chest showed Right suprahilar lesion has decreased slightly in size in the interval since prior PET-CT.  No new or progressive findings on today's study.   04/10/2019 - 05/20/2020  Chemotherapy   04/10/2019, patient started on durvalumab Q 2 weeks maintenance.    06/14/2020 Progression   06/14/2020 CT chest w contrast showed  vidence of progressive lymphangitic spread of tumor throughout the right lung, with contralateral lung nodules with  right pleural effusion  07/01/22 MRI brain is negative for CNS involvement.  CT abdomen with contrast showed no evidence of abdominal metastatic disease PET scan was not approved by insurance-peer to peer appeal with Dr.Vipul Bhanderi    07/08/2020 Cancer Staging   Staging form: Lung, AJCC 8th Edition - Clinical stage from 07/08/2020: Stage IVA (rcT4, cM1a) - Signed by Earlie Server, MD on 07/08/2020   07/16/2020 - 09/28/2021 Chemotherapy   Carboplatin + Paclitaxel + Pembrolizumab q21d x 4 cycles      10/15/2020 Imaging   10/15/2020 CT chest abdomen pelvis redemonstrated postoperative and postradiation appearance of the right chest with perihilar consolidation and fibrosis.  Slight interval decrease in size of the pulmonary nodules associated with interlobular septal thickening nearly complete resolution of previously seen left-sided pulmonary nodules.  Minimal irregular residual.  Right pleural effusion improved.  Consistent with treatment response.  No evidence of metastatic disease with the abdomen or pelvis.  None obstructive bilateral nephrolithiasis  - partial response to the treatment-  12/15/2020, CT chest without contrast showed resolution of lung nodules, no evidence of recurrent disease   10/19/2020 -  Chemotherapy   Keytruda monotherapy maintenance   12/30/2020 [facility] Admission   patient was admitted due to unprovoked extensive deep vein thrombosis from calf vein to the common femoral vein on the left.  Patient was started on heparin drip.  Patient underwent thrombolysis and is a colectomy on 12/31/2020.  Anticoagulation was switched to Eliquis at discharge   04/22/2021 Imaging   CT chest with contrast showed none occlusive pulmonary embolism, Interval progression of consolidation airspace opacity in the medial right middle lobe with bandlike opacity and bronchiectasis in the  perihilar right lung,-this is compatible with evolving posttreatment changes although recurrent disease anteriorly is not excluded.Interval development of numerous bilateral pulmonary nodules, measuring up to 11 mm in the right lung base.  Concerning for metastatic disease.  Tiny right pleural effusion.    04/26/2021 -  Chemotherapy   resumed on carboplatin/Taxol/Keytruda. Patient did not tolerate his chemotherapy of carboplatin/Taxol/Keytruda on 04/26/2021. Patient has developed severe anemia status post PRBC transfusion. 05/25/2021, carboplatin AUC 5 with Keytruda 06/23/2021  continued on carboplatin AUC 5 with Keytruda 07/07/2021 carboplatin with [name]      07/15/2021 Progression   07/15/2021 CT chest with contrast showed enlarged right lower lobe thick-walled cavitary lesion in the anterior aspect of the right lower lobe.  Concerning for progression.  Multiple other small pulmonary nodules are also noted elsewhere in the lungs, many of which are stable compared to prior studies.  Several are new or clearly enlarged.  The best example is an enlarging pulmonary nodule in the left upper lobe, 8 x 6 mm comparing to 4 x 3 mm on 04/22/2021   08/03/2021 -  Chemotherapy   Patient is on Treatment Plan :  LUNG Docetaxel + Ramucirumab q21d      11/16/2021 Imaging   1. The previously noted thick-walled cavity of concern in the right lower lobe now appears more simple and thin-walled in appearance, presumably a post infectious cavity. There is a new adjacent thick-walled cavity in the right lower lobe which is aggressive in appearance, but given the evolution of the previous lesion, this may also be  of infectious etiology. 2. However, there is an enlarging mixed cystic and solid nodule in the left upper lobe which is concerning for metastatic lesion or new primary lesion. This currently measures 11 x 5 x 10 mm. Close attention on follow-up studies is recommended. 3. No definite signs of metastatic disease  to the abdomen or pelvis. 4. Extensive colonic diverticulosis most involved in the sigmoid colon where there appear to be several small diverticular abscesses. one of these appears to fistulized into the wall of the urinary bladder, without frank fistulization to the lumen of the urinary bladder at this time.       INTERVAL HISTORY:  Samuel Choi is here today for repeat clinical assessment. Samuel Choi denies fevers or chills. Samuel Choi denies pain. His appetite is fair.  Samuel Choi lost weight.  Samuel Choi denies nausea vomiting diarrhea.   REVIEW OF SYSTEMS:  Review of Systems  Constitutional:  Positive for fatigue. Negative for appetite change, chills, fever and unexpected weight change.  HENT:   Negative for hearing loss and voice change.   Eyes:  Negative for eye problems and icterus.  Respiratory:  Negative for chest tightness, cough and shortness of breath.   Cardiovascular:  Negative for chest pain and leg swelling.  Gastrointestinal:  Negative for abdominal distention, abdominal pain and blood in stool.  Endocrine: Negative for hot flashes.  Genitourinary:  Negative for difficulty urinating, dysuria and frequency.   Musculoskeletal:  Negative for arthralgias.  Skin:  Negative for itching and rash.  Neurological:  Negative for extremity weakness, light-headedness and numbness.  Hematological:  Negative for adenopathy. Does not bruise/bleed easily.  Psychiatric/Behavioral:  Negative for confusion.       VITALS:  Blood pressure (!) 145/89, pulse 100, temperature (!) 97.2 F (36.2 C), resp. rate 18, weight 127 lb 11.2 oz (57.9 kg), SpO2 99 %.  Wt Readings from Last 3 Encounters:  01/25/22 127 lb 11.2 oz (57.9 kg)  01/05/22 130 lb 8 oz (59.2 kg)  12/30/21 130 lb 8 oz (59.2 kg)    Body mass index is 16.85 kg/m.  Performance status (ECOG): 1 - Symptomatic but completely ambulatory  PHYSICAL EXAM:  Physical Exam HENT:     Head: Normocephalic and atraumatic.  Eyes:     General: No scleral  icterus. Cardiovascular:     Rate and Rhythm: Normal rate.  Pulmonary:     Effort: Pulmonary effort is normal.     Breath sounds: No wheezing or rales.     Comments: Decreased breath sound bilaterally Abdominal:     General: There is no distension.     Palpations: Abdomen is soft.     Tenderness: There is no abdominal tenderness.  Musculoskeletal:        General: No swelling.     Cervical back: Normal range of motion.  Skin:    General: Skin is warm and dry.  Neurological:     General: No focal deficit present.     Mental Status: Samuel Choi is alert. Mental status is at baseline.  Psychiatric:        Mood and Affect: Mood normal.     LABS:      Latest Ref Rng & Units 01/25/2022   10:04 AM 01/13/2022    9:43 AM 01/05/2022   10:15 AM  CBC  WBC 4.0 - 10.5 K/uL 18.0  26.6  1.0   Hemoglobin 13.0 - 17.0 g/dL 11.7  12.7  11.9   Hematocrit 39.0 - 52.0 % 34.9  36.6  33.8  Platelets 150 - 400 K/uL 142  123  94       Latest Ref Rng & Units 01/25/2022   10:04 AM 01/13/2022    9:43 AM 01/05/2022   10:15 AM  CMP  Glucose 70 - 99 mg/dL 137  132  116   BUN 6 - 20 mg/dL _0 Creatinine 0.61 - 1.24 mg/dL 1.28  0.77  0.97   Sodium 135 - 145 mmol/L 136  134  131   Potassium 3.5 - 5.1 mmol/L 4.6  2.8  3.5   Chloride 98 - 111 mmol/L 104  98  96   CO2 22 - 32 mmol/L _1 Calcium 8.9 - 10.3 mg/dL 8.7  8.5  8.4   Total Protein 6.5 - 8.1 g/dL 6.8  6.8  6.4   Total Bilirubin 0.3 - 1.2 mg/dL 0.5  1.0  0.5   Alkaline Phos 38 - 126 U/L 119  127  66   AST 15 - 41 U/L 33  21  27   ALT 0 - 44 U/L _2 HISTORY:   Past Medical History:  Diagnosis Date   Alcohol abuse    usually drinks 2-3 drinks per day   Atherosclerosis 06/2018   Chronic sinusitis    Dehydration 02/07/2019   Emphysema of lung ([place]) 06/2018   patient unaware of this.   Hip fracture ([place]) 05/2018   no surgery   History of kidney stones 05/2018   per xray, bilateral nephrolitiasis   Hypertension     Squamous cell carcinoma of lung, right ([place]) 06/2018    Past Surgical History:  Procedure Laterality Date   BRAIN SURGERY  10/2017   nasal/sinus endoscopy. mass benign   ELECTROMAGNETIC NAVIGATION BROCHOSCOPY Right 06/25/2018   Procedure: ELECTROMAGNETIC NAVIGATION BRONCHOSCOPY;  Surgeon: [name], MD;  Location: [facility];  Service: Cardiopulmonary;  Laterality: Right;   ENDOBRONCHIAL ULTRASOUND Right 06/25/2018   Procedure: ENDOBRONCHIAL ULTRASOUND;  Surgeon: [name], MD;  Location: [facility];  Service: Cardiopulmonary;  Laterality: Right;   ENDOBRONCHIAL ULTRASOUND Right 12/26/2018   Procedure: ENDOBRONCHIAL ULTRASOUND RIGHT;  Surgeon: [name], MD;  Location: [facility];  Service: Cardiopulmonary;  Laterality: Right;   FLEXIBLE BRONCHOSCOPY N/A 08/20/2018   Procedure: FLEXIBLE BRONCHOSCOPY PREOP;  Surgeon: [name], MD;  Location: [facility];  Service: Thoracic;  Laterality: N/A;   IR CV LINE INJECTION  04/04/2019   NASAL SINUS SURGERY  10/2017   At [facility], frontal sinusotomy, ethmoidectomy, resection anterior cranial fossa neoplasm, turbinate resection   PERIPHERAL VASCULAR THROMBECTOMY Left 12/31/2020   Procedure: PERIPHERAL VASCULAR THROMBECTOMY;  Surgeon: Algernon Huxley, MD;  Location: [place] [facility];  Service: Cardiovascular;  Laterality: Left;   PORTACATH PLACEMENT Left 01/15/2019   Procedure: INSERTION PORT-A-CATH;  Surgeon: [name], MD;  Location: [facility];  Service: General;  Laterality: Left;   THORACOTOMY Right 08/13/2018   Procedure: PREOP BROCHOSCOPY WITH RIGHT THORACOTOMY AND RUL RESECTION;  Surgeon: [name], MD;  Location: [facility];  Service: General;  Laterality: Right;   THORACOTOMY Right 08/20/2018   Procedure: THORACOTOMY MAJOR RIGHT UPPER LOBE LOBECTOMY;  Surgeon: [name], MD;  Location: [facility];  Service: Thoracic;  Laterality: Right;   TOE SURGERY Left    pin in left toe    Family History  Problem Relation Age of Onset   Breast cancer  Mother    Diabetes Mother  Lung cancer Father    Hypertension Father     Social History:  reports that Samuel Choi quit smoking about 3 years ago. His smoking use included cigarettes. Samuel Choi has a 7.50 pack-year smoking history. Samuel Choi has never used smokeless tobacco. Samuel Choi reports current alcohol use of about 3.0 standard drinks of alcohol per week. Samuel Choi reports that Samuel Choi does not use drugs.  Allergies: No Known Allergies  Current Medications: Current Outpatient Medications  Medication Sig Dispense Refill   albuterol (VENTOLIN HFA) 108 (90 Base) MCG/ACT inhaler INHALE 2 PUFFS BY MOUTH EVERY 6 HOURS AS NEEDED FOR WHEEZE OR SHORTNESS OF BREATH 8.5 g 5   apixaban (ELIQUIS) 2.5 MG TABS tablet Take 1 tablet (2.5 mg total) by mouth 2 (two) times daily. 60 tablet 6   budesonide-formoterol (SYMBICORT) 80-4.5 MCG/ACT inhaler Inhale 2 puffs into the lungs 2 (two) times daily. in the morning and at bedtime. 10.2 each 12   diltiazem (CARDIZEM CD) 120 MG 24 hr capsule Take 120 mg by mouth daily.     HYDROcodone-acetaminophen (NORCO/VICODIN) 5-325 MG tablet Take 1 tablet by mouth every 8 (eight) hours as needed for severe pain. 30 tablet 0   latanoprost (XALATAN) 0.005 % ophthalmic solution SMARTSIG:In Eye(s)     loratadine (CLARITIN) 10 MG tablet Take 10 mg by mouth daily.     metoprolol succinate (TOPROL-XL) 25 MG 24 hr tablet Take 1 tablet (25 mg total) by mouth daily. 30 tablet 0   nystatin (MYCOSTATIN) 100000 UNIT/ML suspension Take 5 mLs (500,000 Units total) by mouth 4 (four) times daily. Swish and swallow. 473 mL 1   olmesartan (BENICAR) 40 MG tablet Take 40 mg by mouth every other day.     ondansetron (ZOFRAN) 8 MG tablet Take 1 tablet (8 mg total) by mouth every 8 (eight) hours as needed for refractory nausea / vomiting. Start on day 3 after chemo. 90 tablet 1   pantoprazole (PROTONIX) 20 MG tablet Take 1 tablet (20 mg total) by mouth daily. 60 tablet 2   potassium chloride (KLOR-CON) 10 MEQ tablet Take 10 mEq by  mouth daily. Start taking 01/14/22     predniSONE (DELTASONE) 1 MG tablet Take 1 tablet (1 mg total) by mouth See admin instructions. Take 67m daily for 1 week, followed by 140mdaily for 1 week then stop. 28 tablet 0   promethazine (PHENERGAN) 25 MG tablet Take 1 tablet (25 mg total) by mouth every 6 (six) hours as needed for nausea or vomiting. 30 tablet 0   SPIRIVA HANDIHALER 18 MCG inhalation capsule INHALE 1 CAPSULE VIA HANDIHALER ONCE DAILY AT THE SAME TIME EVERY DAY 30 capsule 0   vitamin B-12 (CYANOCOBALAMIN) 1000 MCG tablet Take 1 tablet (1,000 mcg total) by mouth daily. 90 tablet 1   loperamide (IMODIUM) 2 MG capsule Take 1 capsule (2 mg total) by mouth See admin instructions. Initial: 4 mg, followed by 2 mg after each loose stool; maximum: 16 mg/day (Patient not taking: Reported on 12/16/2021) 60 capsule 0   methocarbamol (ROBAXIN) 500 MG tablet Take 1 tablet (500 mg total) by mouth at bedtime. 30 tablet 0   No current facility-administered medications for this visit.   Facility-Administered Medications Ordered in Other Visits  Medication Dose Route Frequency Provider Last Rate Last Admin   dexamethasone (DECADRON) 10 MG/ML injection            heparin lock flush 100 unit/mL  500 Units Intravenous Once YuEarlie ServerMD       heparin lock  flush 100 unit/mL  500 Units Intravenous Once Earlie Server, MD       sodium chloride flush (NS) 0.9 % injection 10 mL  10 mL Intravenous PRN Earlie Server, MD   10 mL at 04/04/19 0903   sodium chloride flush (NS) 0.9 % injection 10 mL  10 mL Intravenous PRN Earlie Server, MD   10 mL at 07/26/20 1308   sodium chloride flush (NS) 0.9 % injection 10 mL  10 mL Intravenous PRN Earlie Server, MD   10 mL at 07/28/21 0828   sodium chloride flush (NS) 0.9 % injection 10 mL  10 mL Intracatheter PRN Earlie Server, MD   10 mL at 01/25/22 1445       IMAGE STUDIES:  NM PET Image Restag (PS) Skull Base To Thigh  Result Date: 11/23/2021 CLINICAL DATA:  Subsequent treatment strategy for  squamous cell lung cancer. Recent chemotherapy. Prior radiation. EXAM: NUCLEAR MEDICINE PET SKULL BASE TO THIGH TECHNIQUE: 7.1 mCi F-18 FDG was injected intravenously. Full-ring PET imaging was performed from the skull base to thigh after the radiotracer. CT data was obtained and used for attenuation correction and anatomic localization. Fasting blood glucose: 110 mg/dl COMPARISON:  CT chest abdomen pelvis dated 11/15/2021 FINDINGS: Mediastinal blood pool activity: SUV max 1.4 Liver activity: SUV max NA NECK: No hypermetabolic cervical lymphadenopathy. Stable 9 mm lesion deep to the left parotid (series 2/image 31), max SUV 4.1, previously 4.6. Incidental CT findings: none CHEST: Status post right upper lobectomy. Radiation changes in the right hemithorax. 9 mm nodular opacity in the central left upper lobe (series 2/image 32), max SUV 1.7. This is enlarging on CT, raising concern for primary bronchogenic neoplasm or metastasis. 1.9 cm thick-walled cavitary lesion in the lateral right lower lobe (series 2/image 100), max SUV 4.5, but new from January 2023. Adjacent 3.3 cm thin-walled cavitary lesion in the anteromedial right lower lobe (series 2/image 105) is improved from January 2023 and non FDG avid. As such, infectious/inflammation is favored. No hypermetabolic thoracic lymphadenopathy. Incidental CT findings: Better evaluated on recent dedicated CT chest. Mosaic attenuation in the lungs bilaterally. ABDOMEN/PELVIS: No abnormal hypermetabolism in the liver, spleen, pancreas, or adrenal glands. No hypermetabolic abdominopelvic lymphadenopathy. Multifocal hypermetabolism adjacent to the sigmoid colon, corresponding to the chronic diverticular abscesses noted on recent CT. Representative max SUV 5.6. Incidental CT findings: Better evaluated on recent dedicated CT chest. Vascular calcifications. Thick-walled bladder. Sigmoid diverticulosis. Prominent rectal stool burden. SKELETON: No focal hypermetabolic activity to  suggest skeletal metastasis. Incidental CT findings: none IMPRESSION: 9 mm nodular opacity in the central left upper lobe demonstrates mild hypermetabolism and is enlarging on CT, raising concern for primary bronchogenic neoplasm or metastasis. 1.9 cm thick-walled cavitary lesion in the lateral right lower lobe is hypermetabolic but new from January 2023, favoring infection/inflammation. Adjacent 3.3 cm thin-walled cavitary lesion in the right lower lobe is improved and non FDG avid. Attention on follow-up is suggested. Status post right upper lobectomy with radiation changes in the right hemithorax. 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 Electronically Signed   By: Julian Hy M.D.   On: 11/23/2021 00:09   CT CHEST ABDOMEN PELVIS W CONTRAST  Result Date: 11/16/2021 CLINICAL DATA:  56 year old male with history of lung cancer status post surgical resection, chemotherapy and radiation therapy. Restaging examination. * Tracking Code: BO * EXAM: CT CHEST, ABDOMEN, AND PELVIS WITH CONTRAST TECHNIQUE: Multidetector CT imaging of the chest, abdomen and pelvis was performed following the  standard protocol during bolus administration of intravenous contrast. RADIATION DOSE REDUCTION: This exam was performed according to the departmental dose-optimization program which includes automated exposure control, adjustment of the mA and/or kV according to patient size and/or use of iterative reconstruction technique. CONTRAST:  166m OMNIPAQUE IOHEXOL 300 MG/ML  SOLN COMPARISON:  Multiple priors, most recently chest CT 07/15/2021. CT the chest, abdomen and pelvis 10/15/2020. FINDINGS: CT CHEST FINDINGS Cardiovascular: Heart size is borderline enlarged with prominence of the right ventricle. There is no significant pericardial fluid, thickening or pericardial calcification. There is aortic atherosclerosis, as well as atherosclerosis of the great vessels of the mediastinum  and the coronary arteries, including calcified atherosclerotic plaque in the left circumflex coronary artery. Mediastinum/Nodes: No pathologically enlarged mediastinal or hilar lymph nodes. Esophagus is unremarkable in appearance. No axillary lymphadenopathy. Lungs/Pleura: Status post right upper lobectomy. Compensatory hyperexpansion of the right middle and lower lobes. Previously noted thick-walled cavitary lesion in the right lower lobe is less thick-walled on today's examination, and now is more of a simple appearing cyst (best appreciated on axial image 94 of series 6). However, there is a new irregular-shaped thick-walled cavitary lesion adjacent to this in the right lower lobe (axial image 87 of series 6) which currently measures 2.4 x 2.0 cm. 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 A few scattered pulmonary nodules are noted elsewhere in the lungs, most notably in the left upper lobe (axial image 40 of series 6 and coronal image 50 of series 4) where there is an ill-defined part solid and cystic lesion measuring 11 x 5 x 10 mm (previously only 8 x 6 x 5 mm). Some clustered nodular architectural distortion is also noted in the base of the right lung, similar to the prior study, favored to reflect areas of chronic post infectious or inflammatory scarring. Trace right pleural effusion is chronic. No left pleural effusion. Mild diffuse bronchial wall thickening with mild centrilobular and paraseptal emphysema. Musculoskeletal: There are no aggressive appearing lytic or blastic lesions noted in the visualized portions of the skeleton. CT ABDOMEN PELVIS FINDINGS Hepatobiliary: No suspicious cystic or solid hepatic lesions. No intra or extrahepatic biliary ductal  dilatation. Gallbladder is normal in appearance. Pancreas: No pancreatic mass. No pancreatic ductal dilatation. No pancreatic or peripancreatic fluid collections or inflammatory changes. Spleen: Unremarkable. Adrenals/Urinary Tract: Subcentimeter low-attenuation lesions in the lower pole of the left kidney, too small to characterize, but statistically likely small cysts (no imaging follow-up is recommended). No other suspicious renal lesions. No hydroureteronephrosis. Bilateral adrenal glands are normal in appearance. Along the superior aspect of the urinary bladder wall (slightly to the left of midline) there is a 2.6 x 1.4 x 1.7 cm rim enhancing centrally low-attenuation structure which contains a combination of fluid and gas which appears to be intramural within the bladder wall, but is also intimately associated with the adjacent sigmoid colon which has numerous colonic diverticulae are. No definite gas within the lumen of the urinary bladder. Stomach/Bowel: The appearance of the stomach is normal. No pathologic dilatation of small bowel or colon. Numerous colonic diverticulae are noted, particularly in the sigmoid colon where there are 2 low-attenuation rim enhancing structures compatible with diverticular abscesses, 1 of which appears intimately associated with the adjacent urinary bladder wall where this appears to be intramural within the wall  of the bladder (discussed above). An adjacent lesion is noted along the left pelvic sidewall in the mid sigmoid colon (axial image 101 of series 2 and coronal image 69 of series 4) measuring 2.3 x 1.6 x 2.2 cm. Another large gas, fluid and rim enhancing structure extends off the superior aspect of the mid sigmoid colon (axial image 93 of series 2 and coronal image 54 of series 4). Other smaller potential diverticular abscess also noted on axial image 97 of series 2 measuring 1.6 x 1.2 cm. There does not appear to be extensive inflammation in the pericolonic fat at this  time. Vascular/Lymphatic: Aortic atherosclerosis, without evidence of aneurysm or dissection in the abdominal or pelvic vasculature. No lymphadenopathy noted in the abdomen or pelvis. Reproductive: Prostate gland and seminal vesicles are unremarkable in appearance. Other: No significant volume of ascites.  No pneumoperitoneum. Musculoskeletal: 8 mm of anterolisthesis of L5 upon S1. There are no aggressive appearing lytic or blastic lesions noted in the visualized portions of the skeleton. IMPRESSION: 1. The previously noted thick-walled cavity of concern in the right lower lobe now appears more simple and thin-walled in appearance, presumably a post infectious cavity. There is a new adjacent thick-walled cavity in the right lower lobe which is aggressive in appearance, but given the evolution of the previous lesion, this may also be of infectious etiology. 2. However, there is an enlarging mixed cystic and solid nodule in the left upper lobe which is concerning for metastatic lesion or new primary lesion. This currently measures 11 x 5 x 10 mm. Close attention on follow-up studies is recommended. 3. No definite signs of metastatic disease to the abdomen or pelvis. 4. Extensive colonic diverticulosis most involved in the sigmoid colon where there appear to be several small diverticular abscesses. 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 Notably, one of these appears to fistulized into the wall of the urinary bladder, without frank fistulization to the lumen of the urinary bladder at this time. Further clinical evaluation, and nonemergent urology consultation in the near future is suggested for future management. 5. Mild diffuse bronchial wall thickening with mild centrilobular and paraseptal emphysema; imaging findings suggestive of underlying COPD. 6. Trace right pleural effusion (chronic and unchanged). 7. Aortic atherosclerosis, in addition to left  circumflex coronary artery disease. Please note that although the presence of coronary artery calcium documents the presence of coronary artery disease, the severity of this disease and any potential stenosis cannot be assessed on this non-gated CT examination. Assessment for potential risk factor modification, dietary therapy or pharmacologic therapy may be warranted, if clinically indicated. 8. Additional incidental findings, as above. Electronically Signed   By: Vinnie Langton M.D.   On: 11/16/2021 08:55

## 2022-01-25 NOTE — Assessment & Plan Note (Deleted)
Unprovoked left lower extremity DVT, status post thrombolysis/thrombectomy, Nonocclusive PE Patient has completed 6+ months of therapeutic dose of Eliquis.  Continue Eliquis 2.5 mg p.o. twice daily.

## 2022-01-25 NOTE — Assessment & Plan Note (Signed)
Chemotherapy as above planned.

## 2022-01-25 NOTE — Assessment & Plan Note (Signed)
Hemoglobin is stable. monitor

## 2022-01-25 NOTE — Assessment & Plan Note (Addendum)
07/16/2020-09/28/2020 carboplatin Taxol and Keytruda x 1  partial response -Keytruda maintenance.-04/22/2021, progression-resumed on carboplatin/Taxol/Keytruda - did not tolerate due to myelosuppression.-->  Taxol held, carboplatin/Keytruda--> CT 07/2020 progression --> 08/03/2021 started docetaxel and ramucirumab.--> Labs are reviewed and discussed with patient. Proceed with Docetaxel and Ramucirumab.   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

## 2022-01-25 NOTE — Assessment & Plan Note (Signed)
Patient previously refuses nutrition supplements. Encourage patient to take 1-2 nutritionist daly Encourage patient to follow-up with nutritionist

## 2022-01-25 NOTE — Assessment & Plan Note (Signed)
Likely due to decreased oral intake IVF NS x 1L this week.  Weekly IVF NS

## 2022-01-26 ENCOUNTER — Telehealth: Payer: Self-pay

## 2022-01-26 NOTE — Telephone Encounter (Signed)
Please schedule patient for IVF (today or tomorrow) and weekly x2 (total of 3 sessions). He has an injection scheduled tomorrow so if it can be scheduled around that it would be great. Please inform pt of appt.

## 2022-01-26 NOTE — Telephone Encounter (Signed)
Health screening form completed and faxed to [employer] employer services @ [phone]

## 2022-01-26 NOTE — Telephone Encounter (Signed)
-----   Message from Earlie Server, MD sent at 01/25/2022  7:26 PM EDT ----- His creatinine is elevated, recommend him to get IVF, 1L NS this week. Please schedule weekly IVF x 2

## 2022-01-27 ENCOUNTER — Inpatient Hospital Stay: Payer: 59

## 2022-01-27 VITALS — BP 128/78 | HR 114 | Temp 97.4°F | Resp 20 | Ht 73.0 in | Wt 128.0 lb

## 2022-01-27 DIAGNOSIS — E860 Dehydration: Secondary | ICD-10-CM

## 2022-01-27 DIAGNOSIS — C3491 Malignant neoplasm of unspecified part of right bronchus or lung: Secondary | ICD-10-CM

## 2022-01-27 DIAGNOSIS — Z5112 Encounter for antineoplastic immunotherapy: Secondary | ICD-10-CM | POA: Diagnosis not present

## 2022-01-27 MED ORDER — PEGFILGRASTIM-BMEZ 6 MG/0.6ML ~~LOC~~ SOSY
6.0000 mg | PREFILLED_SYRINGE | Freq: Once | SUBCUTANEOUS | Status: AC
  Administered 2022-01-27: 6 mg via SUBCUTANEOUS
  Filled 2022-01-27: qty 0.6

## 2022-01-27 MED ORDER — HEPARIN SOD (PORK) LOCK FLUSH 100 UNIT/ML IV SOLN
500.0000 [IU] | Freq: Once | INTRAVENOUS | Status: AC
  Administered 2022-01-27: 500 [IU] via INTRAVENOUS
  Filled 2022-01-27: qty 5

## 2022-01-27 MED ORDER — SODIUM CHLORIDE 0.9% FLUSH
10.0000 mL | Freq: Once | INTRAVENOUS | Status: AC
  Administered 2022-01-27: 10 mL via INTRAVENOUS
  Filled 2022-01-27: qty 10

## 2022-01-27 MED ORDER — SODIUM CHLORIDE 0.9 % IV SOLN
Freq: Once | INTRAVENOUS | Status: AC
  Filled 2022-01-27: qty 250

## 2022-01-30 ENCOUNTER — Other Ambulatory Visit: Payer: Self-pay

## 2022-01-31 ENCOUNTER — Other Ambulatory Visit: Payer: Self-pay

## 2022-01-31 ENCOUNTER — Telehealth: Payer: Self-pay

## 2022-01-31 ENCOUNTER — Inpatient Hospital Stay (HOSPITAL_BASED_OUTPATIENT_CLINIC_OR_DEPARTMENT_OTHER): Payer: 59 | Admitting: Hospice and Palliative Medicine

## 2022-01-31 ENCOUNTER — Other Ambulatory Visit: Payer: Self-pay | Admitting: Oncology

## 2022-01-31 ENCOUNTER — Inpatient Hospital Stay: Payer: 59

## 2022-01-31 ENCOUNTER — Ambulatory Visit
Admission: RE | Admit: 2022-01-31 | Discharge: 2022-01-31 | Disposition: A | Discharge status: Home / Self Care | Payer: 59 | Source: Ambulatory Visit | Attending: Hospice and Palliative Medicine | Admitting: Hospice and Palliative Medicine

## 2022-01-31 ENCOUNTER — Encounter: Payer: Self-pay | Admitting: Hospice and Palliative Medicine

## 2022-01-31 ENCOUNTER — Ambulatory Visit
Admission: RE | Admit: 2022-01-31 | Discharge: 2022-01-31 | Disposition: A | Discharge status: Home / Self Care | Payer: 59 | Source: Home / Self Care | Attending: *Deleted | Admitting: *Deleted

## 2022-01-31 VITALS — BP 88/57 | HR 45 | Temp 98.8°F

## 2022-01-31 VITALS — BP 97/72 | HR 83

## 2022-01-31 DIAGNOSIS — D709 Neutropenia, unspecified: Secondary | ICD-10-CM | POA: Diagnosis not present

## 2022-01-31 DIAGNOSIS — C3491 Malignant neoplasm of unspecified part of right bronchus or lung: Secondary | ICD-10-CM

## 2022-01-31 DIAGNOSIS — A419 Sepsis, unspecified organism: Secondary | ICD-10-CM | POA: Diagnosis not present

## 2022-01-31 DIAGNOSIS — E860 Dehydration: Secondary | ICD-10-CM

## 2022-01-31 DIAGNOSIS — Z95828 Presence of other vascular implants and grafts: Secondary | ICD-10-CM

## 2022-01-31 DIAGNOSIS — J22 Unspecified acute lower respiratory infection: Secondary | ICD-10-CM | POA: Diagnosis not present

## 2022-01-31 DIAGNOSIS — J189 Pneumonia, unspecified organism: Secondary | ICD-10-CM | POA: Diagnosis not present

## 2022-01-31 DIAGNOSIS — R112 Nausea with vomiting, unspecified: Secondary | ICD-10-CM

## 2022-01-31 DIAGNOSIS — Z5112 Encounter for antineoplastic immunotherapy: Secondary | ICD-10-CM | POA: Diagnosis not present

## 2022-01-31 LAB — COMPREHENSIVE METABOLIC PANEL
ALT: 10 U/L (ref 0–44)
AST: 15 U/L (ref 15–41)
Albumin: 2.8 g/dL — ABNORMAL LOW (ref 3.5–5.0)
Alkaline Phosphatase: 76 U/L (ref 38–126)
Anion gap: 11 (ref 5–15)
BUN: 20 mg/dL (ref 6–20)
CO2: 25 mmol/L (ref 22–32)
Calcium: 8.2 mg/dL — ABNORMAL LOW (ref 8.9–10.3)
Chloride: 94 mmol/L — ABNORMAL LOW (ref 98–111)
Creatinine, Ser: 1.05 mg/dL (ref 0.61–1.24)
GFR, Estimated: >60 mL/min (ref >60)
Glucose, Bld: 79 mg/dL (ref 70–99)
Potassium: 4 mmol/L (ref 3.5–5.1)
Sodium: 130 mmol/L — ABNORMAL LOW (ref 135–145)
Total Bilirubin: 1.2 mg/dL (ref 0.3–1.2)
Total Protein: 6.4 g/dL — ABNORMAL LOW (ref 6.5–8.1)

## 2022-01-31 LAB — CBC WITH DIFFERENTIAL/PLATELET
Abs Immature Granulocytes: 0 10*3/uL (ref 0.00–0.07)
Basophils Absolute: 0 10*3/uL (ref 0.0–0.1)
Basophils Relative: 1 %
Eosinophils Absolute: 0 10*3/uL (ref 0.0–0.5)
Eosinophils Relative: 1 %
HCT: 33.2 % — ABNORMAL LOW (ref 39.0–52.0)
Hemoglobin: 11.8 g/dL — ABNORMAL LOW (ref 13.0–17.0)
Immature Granulocytes: 0 %
Lymphocytes Relative: 22 %
Lymphs Abs: 0.3 10*3/uL — ABNORMAL LOW (ref 0.7–4.0)
MCH: 36.2 pg — ABNORMAL HIGH (ref 26.0–34.0)
MCHC: 35.5 g/dL (ref 30.0–36.0)
MCV: 101.8 fL — ABNORMAL HIGH (ref 80.0–100.0)
Monocytes Absolute: 0.5 10*3/uL (ref 0.1–1.0)
Monocytes Relative: 37 %
Neutro Abs: 0.6 10*3/uL — ABNORMAL LOW (ref 1.7–7.7)
Neutrophils Relative %: 39 %
Platelets: 78 10*3/uL — ABNORMAL LOW (ref 150–400)
RBC: 3.26 MIL/uL — ABNORMAL LOW (ref 4.22–5.81)
RDW: 13.9 % (ref 11.5–15.5)
Smear Review: NORMAL
WBC: 1.5 10*3/uL — ABNORMAL LOW (ref 4.0–10.5)
nRBC: 0 % (ref 0.0–0.2)

## 2022-01-31 MED ORDER — ONDANSETRON HCL 4 MG/2ML IJ SOLN
8.0000 mg | Freq: Once | INTRAMUSCULAR | Status: AC
  Administered 2022-01-31: 8 mg via INTRAVENOUS
  Filled 2022-01-31: qty 4

## 2022-01-31 MED ORDER — SODIUM CHLORIDE 0.9 % IV SOLN
INTRAVENOUS | Status: DC
  Filled 2022-01-31 (×2): qty 250

## 2022-01-31 MED ORDER — LEVOFLOXACIN 750 MG PO TABS: 750.0000 mg | ORAL_TABLET | Freq: Every day | ORAL | 0 refills | Status: DC

## 2022-01-31 MED ORDER — HEPARIN SOD (PORK) LOCK FLUSH 100 UNIT/ML IV SOLN
500.0000 [IU] | Freq: Once | INTRAVENOUS | Status: AC
  Administered 2022-01-31: 500 [IU]
  Filled 2022-01-31: qty 5

## 2022-01-31 MED ORDER — BENZONATATE 100 MG PO CAPS: 100.0000 mg | ORAL_CAPSULE | Freq: Three times a day (TID) | ORAL | 0 refills | Status: DC | PRN

## 2022-01-31 MED ORDER — PREDNISONE 20 MG PO TABS: 20.0000 mg | ORAL_TABLET | Freq: Every day | ORAL | 0 refills | Status: DC

## 2022-01-31 NOTE — Progress Notes (Signed)
Patient has a critical anc of 0.6. Billey Chang, NP made aware.- 1515 read back process performed with NP

## 2022-01-31 NOTE — Telephone Encounter (Signed)
[name] wife notified and agreeable to bring pt in. Appointment scheduled. He will stop and obtain chest x-ray prior to appointment.

## 2022-01-31 NOTE — Telephone Encounter (Signed)
Per Dr. Tasia Catchings please see [facility] today

## 2022-01-31 NOTE — Progress Notes (Signed)
[facility] and [facility] at [facility] Telephone:([phone] Fax:([phone]) [phone]  Patient Care Team: Earlie Server, MD as PCP - General (Oncology) Earlie Server, MD as Consulting Physician (Hematology and Oncology)   Name of the patient: Samuel Choi  [id number]  [birth year]   Date of visit: 01/31/22  Reason for Consult: Samuel Choi is a 56 y.o. 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  Most recently, patient has been on docetaxel.  Interval History:  Patient presents to [facility] today with 24 hours of cough, congestion, and shortness of breath.   Patient denies fever or chills but feels weak.  He has had nausea but no vomiting.  Denies any neurologic complaints. Denies any easy bleeding or bruising. Reports fair appetite and denies weight loss. Denies chest pain. Denies any  vomiting, constipation, or diarrhea. Denies urinary complaints. Patient offers no further specific complaints today.  SOCIAL HISTORY:     reports that he quit smoking about 3 years ago. His smoking use included cigarettes. He has a 7.50 pack-year smoking history. He has never used smokeless tobacco. He reports current alcohol use of about 3.0 standard drinks of alcohol per week. He reports that he does not use drugs.  ADVANCE DIRECTIVES:  Visit  CODE STATUS:    PAST MEDICAL HISTORY: Past Medical History:  Diagnosis Date   Alcohol abuse    usually drinks 2-3 drinks per day   Atherosclerosis 06/2018   Chronic sinusitis    Dehydration 02/07/2019   Emphysema of lung ([place]) 06/2018   patient unaware of this.   Hip fracture ([place]) 05/2018   no surgery   History of kidney stones 05/2018   per xray, bilateral nephrolitiasis   Hypertension    Squamous  cell carcinoma of lung, right ([place]) 06/2018    PAST SURGICAL HISTORY:  Past Surgical History:  Procedure Laterality Date   BRAIN SURGERY  10/2017   nasal/sinus endoscopy. mass benign   ELECTROMAGNETIC NAVIGATION BROCHOSCOPY Right 06/25/2018   Procedure: ELECTROMAGNETIC NAVIGATION BRONCHOSCOPY;  Surgeon: [name], MD;  Location: [facility] ORS;  Service: Cardiopulmonary;  Laterality: Right;   ENDOBRONCHIAL ULTRASOUND Right 06/25/2018   Procedure: ENDOBRONCHIAL ULTRASOUND;  Surgeon: [name], MD;  Location: [facility] ORS;  Service: Cardiopulmonary;  Laterality: Right;   ENDOBRONCHIAL ULTRASOUND Right 12/26/2018   Procedure: ENDOBRONCHIAL ULTRASOUND RIGHT;  Surgeon: [name], MD;  Location: [facility] ORS;  Service: Cardiopulmonary;  Laterality: Right;   FLEXIBLE BRONCHOSCOPY N/A 08/20/2018   Procedure: FLEXIBLE BRONCHOSCOPY PREOP;  Surgeon: [name], MD;  Location: [facility] ORS;  Service: Thoracic;  Laterality: N/A;   IR CV LINE INJECTION  04/04/2019   NASAL SINUS SURGERY  10/2017   At [facility], frontal sinusotomy, ethmoidectomy, resection anterior cranial fossa neoplasm, turbinate resection   PERIPHERAL VASCULAR THROMBECTOMY Left 12/31/2020   Procedure: PERIPHERAL VASCULAR THROMBECTOMY;  Surgeon: Algernon Huxley, MD;  Location: [facility] CV [facility];  Service: Cardiovascular;  Laterality: Left;   PORTACATH PLACEMENT Left 01/15/2019   Procedure: INSERTION PORT-A-CATH;  Surgeon: [name], MD;  Location: [facility] ORS;  Service: General;  Laterality: Left;   THORACOTOMY Right 08/13/2018   Procedure: PREOP BROCHOSCOPY WITH RIGHT THORACOTOMY AND RUL RESECTION;  Surgeon: [name], MD;  Location: [facility] OR[facility];  Service: General;  Laterality: Right;   THORACOTOMY Right 08/20/2018   Procedure: THORACOTOMY MAJOR RIGHT  UPPER LOBE LOBECTOMY;  Surgeon: [name], MD;  Location: [facility] ORS;  Service: Thoracic;  Laterality: Right;   TOE SURGERY Left    pin in left toe    HEMATOLOGY/ONCOLOGY HISTORY:  Oncology History   Squamous cell carcinoma of right lung ([place])  08/13/2018 Surgery   s/p Bronchoscopy on 06/25/2018. subcarina EBUS FNA was non diagnostic, hypocellular specimen.  08/13/18 Patient underwent right thoracotomy and wedge resection of a right upper lobe mass.  Frozen section was consistent with an inflammatory process.  On the second postop day, preliminary pathology reports high-grade malignancy.  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  08/20/2018 Patient was therefore taken back to the OR and underwent take complete lobectomy  pT1b pN0 cM0 stage I squamous cell lung cancer. Margin is negative.  Recommend observation.     08/13/2018 Initial Diagnosis   Stage I Squamous cell carcinoma of lung, right ([place])   12/03/2018 Progression   12/03/2018 CT chest showed abnormal soft tissue in the right suprahilar region measuring up to 2.5 cm, worrisome for recurrence 02/14/8675 PET Hypermetabolic RIGHT suprahilar mass consistent with lung cancer, recurrence. 12/26/2018 s/p bronchoscopy biopsy. Confirmed local recurrence of squamous cell lung cancer.  medi port placed by Dr.Oaks. 01/20/19 MRI brain w/wo contrast: No evidence of metastatic disease   01/17/2019 - 03/07/2019 Chemotherapy   concurrent chemoradiation on 01/16/2019 Carboplatin AUC of 2 and Taxol 45 mg/m2 weekly finished in 03/05/2019    03/31/2019 Imaging   03/31/19 CT chest showed Right suprahilar lesion has decreased slightly in size in the interval since prior PET-CT.  No new or progressive findings on today's study.   04/10/2019 - 05/20/2020 Chemotherapy   04/10/2019, patient started on durvalumab Q 2 weeks maintenance.    06/14/2020 Progression   06/14/2020 CT chest w contrast showed vidence of progressive lymphangitic spread of tumor throughout the right lung, with contralateral lung nodules with  right pleural effusion  07/01/22 MRI brain is negative for CNS involvement.  CT abdomen with contrast showed no evidence of abdominal metastatic  disease PET scan was not approved by insurance-peer to peer appeal with Dr.Vipul Bhanderi    07/08/2020 Cancer Staging   Staging form: Lung, AJCC 8th Edition - Clinical stage from 07/08/2020: Stage IVA (rcT4, cM1a) - Signed by Earlie Server, MD on 07/08/2020   07/16/2020 - 09/28/2021 Chemotherapy   Carboplatin + Paclitaxel + Pembrolizumab q21d x 4 cycles      10/15/2020 Imaging   10/15/2020 CT chest abdomen pelvis redemonstrated postoperative and postradiation appearance of the right chest with perihilar consolidation and fibrosis.  Slight interval decrease in size of the pulmonary nodules associated with interlobular septal thickening nearly complete resolution of previously seen left-sided pulmonary nodules.  Minimal irregular residual.  Right pleural effusion improved.  Consistent with treatment response.  No evidence of metastatic disease with the abdomen or pelvis.  None obstructive bilateral nephrolithiasis  - partial response to the treatment-  12/15/2020, CT chest without contrast showed resolution of lung nodules, no evidence of recurrent disease   10/19/2020 -  Chemotherapy   Keytruda monotherapy maintenance   12/30/2020 [facility] Admission   patient was admitted due to unprovoked extensive deep vein thrombosis from calf vein to the common femoral vein on the left.  Patient was started on heparin drip.  Patient underwent thrombolysis and is a colectomy on 12/31/2020.  Anticoagulation was switched to Eliquis at discharge   04/22/2021 Imaging   CT chest with contrast showed none occlusive pulmonary embolism, Interval progression  of consolidation airspace opacity in the medial right middle lobe with bandlike opacity and bronchiectasis in the perihilar right lung,-this is compatible with evolving posttreatment changes although recurrent disease anteriorly is not excluded.Interval development of numerous bilateral pulmonary nodules, measuring up to 11 mm in the right lung base.  Concerning for  metastatic disease.  Tiny right pleural effusion.    04/26/2021 -  Chemotherapy   resumed on carboplatin/Taxol/Keytruda. Patient did not tolerate his chemotherapy of carboplatin/Taxol/Keytruda on 04/26/2021. Patient has developed severe anemia status post PRBC transfusion. 05/25/2021, carboplatin AUC 5 with Keytruda 06/23/2021  continued on carboplatin AUC 5 with Keytruda 07/07/2021 carboplatin with [name]      07/15/2021 Progression   07/15/2021 CT chest with contrast showed enlarged right lower lobe thick-walled cavitary lesion in the anterior aspect of the right lower lobe.  Concerning for progression.  Multiple other small pulmonary nodules are also noted elsewhere in the lungs, many of which are stable compared to prior studies.  Several are new or clearly enlarged.  The best example is an enlarging pulmonary nodule in the left upper lobe, 8 x 6 mm comparing to 4 x 3 mm on 04/22/2021   08/03/2021 -  Chemotherapy   Patient is on Treatment Plan :  LUNG Docetaxel + Ramucirumab q21d      11/16/2021 Imaging   1. The previously noted thick-walled cavity of concern in the right lower lobe now appears more simple and thin-walled in appearance, presumably a post infectious cavity. There is a new adjacent thick-walled cavity in the right lower lobe which is aggressive in appearance, but given the evolution of the previous lesion, this may also be of infectious etiology. 2. However, there is an enlarging mixed cystic and solid nodule in the left upper lobe which is concerning for metastatic lesion or new primary lesion. This currently measures 11 x 5 x 10 mm. Close attention on follow-up studies is recommended. 3. No definite signs of metastatic disease to the abdomen or pelvis. 4. Extensive colonic diverticulosis most involved in the sigmoid colon where there appear to be several small diverticular abscesses. one of these appears to fistulized into the wall of the urinary bladder, without frank  fistulization to the lumen of the urinary bladder at this time.      ALLERGIES:  has No Known Allergies.  MEDICATIONS:  Current Outpatient Medications  Medication Sig Dispense Refill   albuterol (VENTOLIN HFA) 108 (90 Base) MCG/ACT inhaler INHALE 2 PUFFS BY MOUTH EVERY 6 HOURS AS NEEDED FOR WHEEZE OR SHORTNESS OF BREATH 8.5 g 5   apixaban (ELIQUIS) 2.5 MG TABS tablet Take 1 tablet (2.5 mg total) by mouth 2 (two) times daily. 60 tablet 6   budesonide-formoterol (SYMBICORT) 80-4.5 MCG/ACT inhaler Inhale 2 puffs into the lungs 2 (two) times daily. in the morning and at bedtime. 10.2 each 12   diltiazem (CARDIZEM CD) 120 MG 24 hr capsule Take 120 mg by mouth daily.     HYDROcodone-acetaminophen (NORCO/VICODIN) 5-325 MG tablet Take 1 tablet by mouth every 8 (eight) hours as needed for severe pain. 30 tablet 0   latanoprost (XALATAN) 0.005 % ophthalmic solution SMARTSIG:In Eye(s)     loratadine (CLARITIN) 10 MG tablet Take 10 mg by mouth daily.     methocarbamol (ROBAXIN) 500 MG tablet Take 1 tablet (500 mg total) by mouth at bedtime. 30 tablet 0   metoprolol succinate (TOPROL-XL) 25 MG 24 hr tablet Take 1 tablet (25 mg total) by mouth daily. 30 tablet 0  nystatin (MYCOSTATIN) 100000 UNIT/ML suspension Take 5 mLs (500,000 Units total) by mouth 4 (four) times daily. Swish and swallow. 473 mL 1   olmesartan (BENICAR) 40 MG tablet Take 40 mg by mouth every other day.     ondansetron (ZOFRAN) 8 MG tablet Take 1 tablet (8 mg total) by mouth every 8 (eight) hours as needed for refractory nausea / vomiting. Start on day 3 after chemo. 90 tablet 1   pantoprazole (PROTONIX) 20 MG tablet Take 1 tablet (20 mg total) by mouth daily. 60 tablet 2   potassium chloride (KLOR-CON) 10 MEQ tablet Take 10 mEq by mouth daily. Start taking 01/14/22     predniSONE (DELTASONE) 1 MG tablet Take 1 tablet (1 mg total) by mouth See admin instructions. Take 65m daily for 1 week, followed by 158mdaily for 1 week then stop. 28  tablet 0   promethazine (PHENERGAN) 25 MG tablet Take 1 tablet (25 mg total) by mouth every 6 (six) hours as needed for nausea or vomiting. 30 tablet 0   SPIRIVA HANDIHALER 18 MCG inhalation capsule INHALE 1 CAPSULE VIA HANDIHALER ONCE DAILY AT THE SAME TIME EVERY DAY 30 capsule 0   vitamin B-12 (CYANOCOBALAMIN) 1000 MCG tablet Take 1 tablet (1,000 mcg total) by mouth daily. 90 tablet 1   loperamide (IMODIUM) 2 MG capsule Take 1 capsule (2 mg total) by mouth See admin instructions. Initial: 4 mg, followed by 2 mg after each loose stool; maximum: 16 mg/day (Patient not taking: Reported on 12/16/2021) 60 capsule 0   No current facility-administered medications for this visit.   Facility-Administered Medications Ordered in Other Visits  Medication Dose Route Frequency Provider Last Rate Last Admin   0.9 %  sodium chloride infusion   Intravenous Continuous Shandel Busic, JoKirt BoysNP   Stopped at 01/31/22 1512   dexamethasone (DECADRON) 10 MG/ML injection            heparin lock flush 100 unit/mL  500 Units Intravenous Once YuEarlie ServerMD       heparin lock flush 100 unit/mL  500 Units Intravenous Once YuEarlie ServerMD       sodium chloride flush (NS) 0.9 % injection 10 mL  10 mL Intravenous PRN YuEarlie ServerMD   10 mL at 04/04/19 0903   sodium chloride flush (NS) 0.9 % injection 10 mL  10 mL Intravenous PRN YuEarlie ServerMD   10 mL at 07/26/20 1308   sodium chloride flush (NS) 0.9 % injection 10 mL  10 mL Intravenous PRN YuEarlie ServerMD   10 mL at 07/28/21 0828    VITAL SIGNS: BP (!) 88/57   Pulse (!) 45   Temp 98.8 F (37.1 C)   SpO2 92%  There were no vitals filed for this visit.  Estimated body mass index is 16.89 kg/m as calculated from the following:   Height as of 01/27/22: _0  (1.854 m).   Weight as of 01/27/22: 128 lb (58.1 kg).  LABS: CBC:    Component Value Date/Time   WBC 1.5 (L) 01/31/2022 1323   HGB 11.8 (L) 01/31/2022 1323   HGB 15.9 11/02/2014 1859   HCT 33.2 (L) 01/31/2022 1323   HCT 46.0  11/02/2014 1859   PLT 78 (L) 01/31/2022 1323   PLT 143 (L) 11/02/2014 1859   MCV 101.8 (H) 01/31/2022 1323   MCV 100 11/02/2014 1859   NEUTROABS 0.6 (L) 01/31/2022 1323   NEUTROABS 9.0 (H) 07/07/2014 0516   LYMPHSABS 0.3 (L) 01/31/2022  1323   LYMPHSABS 1.5 07/07/2014 0516   MONOABS 0.5 01/31/2022 1323   MONOABS 0.6 07/07/2014 0516   EOSABS 0.0 01/31/2022 1323   EOSABS 0.3 07/07/2014 0516   BASOSABS 0.0 01/31/2022 1323   BASOSABS 0.1 07/07/2014 0516   Comprehensive Metabolic Panel:    Component Value Date/Time   NA 130 (L) 01/31/2022 1323   NA 137 11/02/2014 1859   K 4.0 01/31/2022 1323   K 3.5 11/02/2014 1859   CL 94 (L) 01/31/2022 1323   CL 101 11/02/2014 1859   CO2 25 01/31/2022 1323   CO2 26 11/02/2014 1859   BUN 20 01/31/2022 1323   BUN 11 11/02/2014 1859   CREATININE 1.05 01/31/2022 1323   CREATININE 1.06 11/02/2014 1859   GLUCOSE 79 01/31/2022 1323   GLUCOSE 100 (H) 11/02/2014 1859   CALCIUM 8.2 (L) 01/31/2022 1323   CALCIUM 8.9 11/02/2014 1859   AST 15 01/31/2022 1323   AST 40 11/02/2014 1859   ALT 10 01/31/2022 1323   ALT 23 11/02/2014 1859   ALKPHOS 76 01/31/2022 1323   ALKPHOS 83 11/02/2014 1859   BILITOT 1.2 01/31/2022 1323   BILITOT 1.1 11/02/2014 1859   PROT 6.4 (L) 01/31/2022 1323   PROT 7.8 11/02/2014 1859   ALBUMIN 2.8 (L) 01/31/2022 1323   ALBUMIN 4.0 11/02/2014 1859    RADIOGRAPHIC STUDIES: DG Chest 2 View  Result Date: 01/31/2022 CLINICAL DATA:  Persistent cough EXAM: CHEST - 2 VIEW COMPARISON:  02/04/2020 FINDINGS: Postsurgical changes of right upper lobectomy with interstitial thickening and scarring. Unchanged blunting of the right costophrenic angle, likely scarring. Unchanged cardiac and mediastinal contours. Left chest port with catheter tip in the SVC. The left lung is clear. No acute osseous abnormality. IMPRESSION: Stable chest radiograph. Electronically Signed   By: Merilyn Baba M.D.   On: 01/31/2022 13:16    PERFORMANCE STATUS  (ECOG) : 3 - Symptomatic, >50% confined to bed  Review of Systems Unless otherwise noted, a complete review of systems is negative.  Physical Exam General: NAD Cardiovascular: regular rate and rhythm Pulmonary: Coarse with expiratory wheezing anterior/posterior fields Abdomen: soft, nontender, + bowel sounds GU: no suprapubic tenderness Extremities: no edema, no joint deformities Skin: no rashes Neurological: Weakness but otherwise nonfocal  Assessment and Plan- Patient is a 56 y.o. 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  Most recently, patient has been on docetaxel.   Lower respiratory infection -chest x-ray stable without acute changes.  However, patient appears clinically tenuous.  Patient is neutropenic without fever.  Had a long conversation with patient and wife and the patient was seen by Dr. Tasia Catchings.  Patient declined offer to transfer to the emergency department/hospitalization.  He said he would prefer outpatient management.  We will start him on Levaquin, steroids, and antitussives.  Proceed with IV fluids.  Patient was recommended to obtain COVID testing.  We will plan to see him daily until improvement in symptoms.  Case and plan discussed with Dr. Tasia Catchings  Patient expressed understanding and was in agreement with this plan. He also understands that He can call clinic at any time with any questions, concerns, or complaints.   Thank you for allowing me to participate in the care of this very pleasant patient.   Time Total: 25 minutes  Visit consisted of counseling and education dealing with the  complex and emotionally intense issues of symptom management in the setting of serious illness.Greater than 50%  of this time was spent counseling and coordinating care  related to the above assessment and plan.  Signed by: Altha Harm, PhD, NP-C

## 2022-01-31 NOTE — Progress Notes (Signed)
Patient states he has been coughing all night. (Coughing of clear liquid). Patient states he is nausea and has no appetite. Patient states he is weak.

## 2022-01-31 NOTE — Telephone Encounter (Signed)
Patients wife, Samuel Choi, is calling because [name] has not felt well since Sunday evening. She states that he is not eating, coughing,  and too weak to stand. Samuel Choi callback # [phone].

## 2022-02-01 ENCOUNTER — Inpatient Hospital Stay: Payer: 59

## 2022-02-01 ENCOUNTER — Other Ambulatory Visit: Payer: Self-pay | Admitting: *Deleted

## 2022-02-01 ENCOUNTER — Other Ambulatory Visit: Payer: Self-pay

## 2022-02-01 ENCOUNTER — Emergency Department: Payer: 59

## 2022-02-01 ENCOUNTER — Inpatient Hospital Stay (HOSPITAL_BASED_OUTPATIENT_CLINIC_OR_DEPARTMENT_OTHER): Payer: 59 | Admitting: Hospice and Palliative Medicine

## 2022-02-01 ENCOUNTER — Inpatient Hospital Stay
Admission: EM | Admit: 2022-02-01 | Discharge: 2022-02-05 | DRG: 871 | Disposition: A | Discharge status: Home / Self Care | Payer: 59 | Source: Ambulatory Visit | Attending: Osteopathic Medicine | Admitting: Osteopathic Medicine

## 2022-02-01 ENCOUNTER — Telehealth: Payer: Self-pay | Admitting: *Deleted

## 2022-02-01 ENCOUNTER — Encounter: Payer: Self-pay | Admitting: Radiology

## 2022-02-01 VITALS — BP 98/59 | HR 68

## 2022-02-01 DIAGNOSIS — D709 Neutropenia, unspecified: Secondary | ICD-10-CM

## 2022-02-01 DIAGNOSIS — Z5111 Encounter for antineoplastic chemotherapy: Secondary | ICD-10-CM

## 2022-02-01 DIAGNOSIS — E876 Hypokalemia: Secondary | ICD-10-CM | POA: Diagnosis present

## 2022-02-01 DIAGNOSIS — I5032 Chronic diastolic (congestive) heart failure: Secondary | ICD-10-CM | POA: Diagnosis present

## 2022-02-01 DIAGNOSIS — T451X5A Adverse effect of antineoplastic and immunosuppressive drugs, initial encounter: Secondary | ICD-10-CM | POA: Diagnosis present

## 2022-02-01 DIAGNOSIS — Z7901 Long term (current) use of anticoagulants: Secondary | ICD-10-CM

## 2022-02-01 DIAGNOSIS — I4892 Unspecified atrial flutter: Secondary | ICD-10-CM | POA: Diagnosis not present

## 2022-02-01 DIAGNOSIS — E43 Unspecified severe protein-calorie malnutrition: Secondary | ICD-10-CM | POA: Diagnosis present

## 2022-02-01 DIAGNOSIS — F1010 Alcohol abuse, uncomplicated: Secondary | ICD-10-CM

## 2022-02-01 DIAGNOSIS — E46 Unspecified protein-calorie malnutrition: Secondary | ICD-10-CM | POA: Diagnosis present

## 2022-02-01 DIAGNOSIS — I2782 Chronic pulmonary embolism: Secondary | ICD-10-CM | POA: Diagnosis present

## 2022-02-01 DIAGNOSIS — Z7951 Long term (current) use of inhaled steroids: Secondary | ICD-10-CM

## 2022-02-01 DIAGNOSIS — F1011 Alcohol abuse, in remission: Secondary | ICD-10-CM | POA: Diagnosis present

## 2022-02-01 DIAGNOSIS — I2699 Other pulmonary embolism without acute cor pulmonale: Secondary | ICD-10-CM | POA: Diagnosis present

## 2022-02-01 DIAGNOSIS — D6959 Other secondary thrombocytopenia: Secondary | ICD-10-CM | POA: Diagnosis present

## 2022-02-01 DIAGNOSIS — I471 Supraventricular tachycardia: Secondary | ICD-10-CM | POA: Diagnosis not present

## 2022-02-01 DIAGNOSIS — C3491 Malignant neoplasm of unspecified part of right bronchus or lung: Secondary | ICD-10-CM

## 2022-02-01 DIAGNOSIS — Z5112 Encounter for antineoplastic immunotherapy: Secondary | ICD-10-CM | POA: Diagnosis not present

## 2022-02-01 DIAGNOSIS — I5033 Acute on chronic diastolic (congestive) heart failure: Secondary | ICD-10-CM | POA: Diagnosis present

## 2022-02-01 DIAGNOSIS — Y95 Nosocomial condition: Secondary | ICD-10-CM | POA: Diagnosis present

## 2022-02-01 DIAGNOSIS — Z20822 Contact with and (suspected) exposure to covid-19: Secondary | ICD-10-CM | POA: Diagnosis present

## 2022-02-01 DIAGNOSIS — J449 Chronic obstructive pulmonary disease, unspecified: Secondary | ICD-10-CM | POA: Diagnosis present

## 2022-02-01 DIAGNOSIS — A419 Sepsis, unspecified organism: Secondary | ICD-10-CM | POA: Diagnosis present

## 2022-02-01 DIAGNOSIS — J22 Unspecified acute lower respiratory infection: Secondary | ICD-10-CM | POA: Diagnosis not present

## 2022-02-01 DIAGNOSIS — J439 Emphysema, unspecified: Secondary | ICD-10-CM | POA: Diagnosis present

## 2022-02-01 DIAGNOSIS — J9601 Acute respiratory failure with hypoxia: Secondary | ICD-10-CM | POA: Diagnosis present

## 2022-02-01 DIAGNOSIS — D539 Nutritional anemia, unspecified: Secondary | ICD-10-CM | POA: Diagnosis present

## 2022-02-01 DIAGNOSIS — Z87891 Personal history of nicotine dependence: Secondary | ICD-10-CM

## 2022-02-01 DIAGNOSIS — Z79899 Other long term (current) drug therapy: Secondary | ICD-10-CM

## 2022-02-01 DIAGNOSIS — D696 Thrombocytopenia, unspecified: Secondary | ICD-10-CM | POA: Diagnosis not present

## 2022-02-01 DIAGNOSIS — J441 Chronic obstructive pulmonary disease with (acute) exacerbation: Secondary | ICD-10-CM | POA: Diagnosis present

## 2022-02-01 DIAGNOSIS — I2721 Secondary pulmonary arterial hypertension: Secondary | ICD-10-CM | POA: Diagnosis present

## 2022-02-01 DIAGNOSIS — I10 Essential (primary) hypertension: Secondary | ICD-10-CM | POA: Diagnosis present

## 2022-02-01 DIAGNOSIS — Z681 Body mass index (BMI) 19 or less, adult: Secondary | ICD-10-CM | POA: Diagnosis not present

## 2022-02-01 DIAGNOSIS — I110 Hypertensive heart disease with heart failure: Secondary | ICD-10-CM | POA: Diagnosis present

## 2022-02-01 DIAGNOSIS — Z801 Family history of malignant neoplasm of trachea, bronchus and lung: Secondary | ICD-10-CM | POA: Diagnosis not present

## 2022-02-01 DIAGNOSIS — E871 Hypo-osmolality and hyponatremia: Secondary | ICD-10-CM | POA: Diagnosis present

## 2022-02-01 DIAGNOSIS — Z86718 Personal history of other venous thrombosis and embolism: Secondary | ICD-10-CM

## 2022-02-01 DIAGNOSIS — J189 Pneumonia, unspecified organism: Secondary | ICD-10-CM | POA: Diagnosis present

## 2022-02-01 DIAGNOSIS — E860 Dehydration: Secondary | ICD-10-CM

## 2022-02-01 LAB — LACTIC ACID, PLASMA: Lactic Acid, Venous: 1.4 mmol/L (ref 0.5–1.9)

## 2022-02-01 LAB — MAGNESIUM: Magnesium: 1.1 mg/dL — ABNORMAL LOW (ref 1.7–2.4)

## 2022-02-01 LAB — CBC WITH DIFFERENTIAL/PLATELET
Abs Immature Granulocytes: 0.04 10*3/uL (ref 0.00–0.07)
Basophils Absolute: 0 10*3/uL (ref 0.0–0.1)
Basophils Relative: 0 %
Eosinophils Absolute: 0 10*3/uL (ref 0.0–0.5)
Eosinophils Relative: 0 %
HCT: 30.3 % — ABNORMAL LOW (ref 39.0–52.0)
Hemoglobin: 10.7 g/dL — ABNORMAL LOW (ref 13.0–17.0)
Immature Granulocytes: 1 %
Lymphocytes Relative: 4 %
Lymphs Abs: 0.3 10*3/uL — ABNORMAL LOW (ref 0.7–4.0)
MCH: 35.5 pg — ABNORMAL HIGH (ref 26.0–34.0)
MCHC: 35.3 g/dL (ref 30.0–36.0)
MCV: 100.7 fL — ABNORMAL HIGH (ref 80.0–100.0)
Monocytes Absolute: 1.3 10*3/uL — ABNORMAL HIGH (ref 0.1–1.0)
Monocytes Relative: 17 %
Neutro Abs: 6 10*3/uL (ref 1.7–7.7)
Neutrophils Relative %: 78 %
Platelets: 56 10*3/uL — ABNORMAL LOW (ref 150–400)
RBC: 3.01 MIL/uL — ABNORMAL LOW (ref 4.22–5.81)
RDW: 13.7 % (ref 11.5–15.5)
Smear Review: NORMAL
WBC Morphology: INCREASED
WBC: 7.6 10*3/uL (ref 4.0–10.5)
nRBC: 0 % (ref 0.0–0.2)

## 2022-02-01 LAB — CBG MONITORING, ED: Glucose-Capillary: 103 mg/dL — ABNORMAL HIGH (ref 70–99)

## 2022-02-01 LAB — BASIC METABOLIC PANEL
Anion gap: 11 (ref 5–15)
Anion gap: 15 (ref 5–15)
BUN: 21 mg/dL — ABNORMAL HIGH (ref 6–20)
BUN: 19 mg/dL (ref 6–20)
CO2: 21 mmol/L — ABNORMAL LOW (ref 22–32)
CO2: 17 mmol/L — ABNORMAL LOW (ref 22–32)
Calcium: 7.7 mg/dL — ABNORMAL LOW (ref 8.9–10.3)
Calcium: 7.8 mg/dL — ABNORMAL LOW (ref 8.9–10.3)
Chloride: 97 mmol/L — ABNORMAL LOW (ref 98–111)
Chloride: 99 mmol/L (ref 98–111)
Creatinine, Ser: 1.06 mg/dL (ref 0.61–1.24)
Creatinine, Ser: 1.02 mg/dL (ref 0.61–1.24)
GFR, Estimated: >60 mL/min (ref >60)
GFR, Estimated: >60 mL/min (ref >60)
Glucose, Bld: 62 mg/dL — ABNORMAL LOW (ref 70–99)
Glucose, Bld: 42 mg/dL — CL (ref 70–99)
Potassium: 3.4 mmol/L — ABNORMAL LOW (ref 3.5–5.1)
Potassium: 3.8 mmol/L (ref 3.5–5.1)
Sodium: 129 mmol/L — ABNORMAL LOW (ref 135–145)
Sodium: 131 mmol/L — ABNORMAL LOW (ref 135–145)

## 2022-02-01 LAB — PROCALCITONIN: Procalcitonin: 45.67 ng/mL

## 2022-02-01 LAB — SARS CORONAVIRUS 2 BY RT PCR: SARS Coronavirus 2 by RT PCR: NEGATIVE

## 2022-02-01 LAB — PHOSPHORUS: Phosphorus: 2.3 mg/dL — ABNORMAL LOW (ref 2.5–4.6)

## 2022-02-01 LAB — BRAIN NATRIURETIC PEPTIDE: B Natriuretic Peptide: 920.5 pg/mL — ABNORMAL HIGH (ref 0.0–100.0)

## 2022-02-01 LAB — HIV ANTIBODY (ROUTINE TESTING W REFLEX): HIV Screen 4th Generation wRfx: NONREACTIVE

## 2022-02-01 LAB — OSMOLALITY: Osmolality: 277 mOsm/kg (ref 275–295)

## 2022-02-01 MED ORDER — VANCOMYCIN HCL 1250 MG/250ML IV SOLN
1250.0000 mg | Freq: Once | INTRAVENOUS | Status: AC
  Administered 2022-02-01: 1250 mg via INTRAVENOUS
  Filled 2022-02-01: qty 250

## 2022-02-01 MED ORDER — DEXTROSE 50 % IV SOLN: 50.0000 mL | INTRAVENOUS | Status: DC | PRN

## 2022-02-01 MED ORDER — ACETAMINOPHEN 325 MG PO TABS: 650.0000 mg | ORAL_TABLET | Freq: Four times a day (QID) | ORAL | Status: DC | PRN

## 2022-02-01 MED ORDER — LORATADINE 10 MG PO TABS
10.0000 mg | ORAL_TABLET | Freq: Every day | ORAL | Status: DC
  Administered 2022-02-02 – 2022-02-05 (×4): 10 mg via ORAL
  Filled 2022-02-01 (×4): qty 1

## 2022-02-01 MED ORDER — SODIUM CHLORIDE 0.9 % IV SOLN
2.0000 g | Freq: Three times a day (TID) | INTRAVENOUS | Status: DC
  Administered 2022-02-01 – 2022-02-05 (×11): 2 g via INTRAVENOUS
  Filled 2022-02-01: qty 2
  Filled 2022-02-01 (×11): qty 12.5

## 2022-02-01 MED ORDER — SODIUM CHLORIDE 0.9% FLUSH
10.0000 mL | Freq: Once | INTRAVENOUS | Status: AC
  Administered 2022-02-01: 10 mL via INTRAVENOUS
  Filled 2022-02-01: qty 10

## 2022-02-01 MED ORDER — VANCOMYCIN HCL 1250 MG/250ML IV SOLN
1250.0000 mg | INTRAVENOUS | Status: DC
  Administered 2022-02-02 – 2022-02-03 (×2): 1250 mg via INTRAVENOUS
  Filled 2022-02-01 (×3): qty 250

## 2022-02-01 MED ORDER — HEPARIN SOD (PORK) LOCK FLUSH 100 UNIT/ML IV SOLN
500.0000 [IU] | Freq: Once | INTRAVENOUS | Status: DC
  Filled 2022-02-01: qty 5

## 2022-02-01 MED ORDER — SODIUM CHLORIDE 1 G PO TABS
2.0000 g | ORAL_TABLET | Freq: Two times a day (BID) | ORAL | Status: DC
  Administered 2022-02-02 – 2022-02-05 (×7): 2 g via ORAL
  Filled 2022-02-01 (×7): qty 2

## 2022-02-01 MED ORDER — LATANOPROST 0.005 % OP SOLN
1.0000 [drp] | Freq: Every day | OPHTHALMIC | Status: DC
Start: 2022-02-01 — End: 2022-02-05
  Administered 2022-02-01 – 2022-02-04 (×4): 1 [drp] via OPHTHALMIC
  Filled 2022-02-01 (×2): qty 2.5

## 2022-02-01 MED ORDER — ALBUTEROL SULFATE (2.5 MG/3ML) 0.083% IN NEBU
2.5000 mg | INHALATION_SOLUTION | RESPIRATORY_TRACT | Status: DC | PRN
  Administered 2022-02-03: 2.5 mg via RESPIRATORY_TRACT
  Filled 2022-02-01: qty 3

## 2022-02-01 MED ORDER — PANTOPRAZOLE SODIUM 20 MG PO TBEC
20.0000 mg | DELAYED_RELEASE_TABLET | Freq: Every day | ORAL | Status: DC
  Administered 2022-02-02 – 2022-02-05 (×4): 20 mg via ORAL
  Filled 2022-02-01 (×4): qty 1

## 2022-02-01 MED ORDER — VITAMIN B-12 1000 MCG PO TABS
1000.0000 ug | ORAL_TABLET | Freq: Every day | ORAL | Status: DC
Start: 2022-02-02 — End: 2022-02-05
  Administered 2022-02-02 – 2022-02-05 (×4): 1000 ug via ORAL
  Filled 2022-02-01 (×4): qty 1

## 2022-02-01 MED ORDER — ENSURE ENLIVE PO LIQD
237.0000 mL | Freq: Two times a day (BID) | ORAL | Status: DC
  Administered 2022-02-02 – 2022-02-03 (×3): 237 mL via ORAL

## 2022-02-01 MED ORDER — SODIUM CHLORIDE 0.9 % IV SOLN
500.0000 mg | Freq: Once | INTRAVENOUS | Status: DC
  Filled 2022-02-01: qty 5

## 2022-02-01 MED ORDER — APIXABAN 2.5 MG PO TABS
2.5000 mg | ORAL_TABLET | Freq: Two times a day (BID) | ORAL | Status: DC
  Administered 2022-02-01 – 2022-02-02 (×3): 2.5 mg via ORAL
  Filled 2022-02-01 (×5): qty 1

## 2022-02-01 MED ORDER — SODIUM CHLORIDE 0.9 % IV BOLUS
500.0000 mL | Freq: Once | INTRAVENOUS | Status: AC
  Administered 2022-02-01: 500 mL via INTRAVENOUS

## 2022-02-01 MED ORDER — SODIUM CHLORIDE 0.9 % IV SOLN
Freq: Once | INTRAVENOUS | Status: AC
  Filled 2022-02-01: qty 250

## 2022-02-01 MED ORDER — DEXTROSE 50 % IV SOLN
50.0000 mL | Freq: Once | INTRAVENOUS | Status: AC
  Administered 2022-02-01: 50 mL via INTRAVENOUS
  Filled 2022-02-01: qty 50

## 2022-02-01 MED ORDER — POTASSIUM CHLORIDE CRYS ER 20 MEQ PO TBCR
40.0000 meq | EXTENDED_RELEASE_TABLET | Freq: Once | ORAL | Status: AC
  Administered 2022-02-01: 40 meq via ORAL
  Filled 2022-02-01: qty 2

## 2022-02-01 MED ORDER — IPRATROPIUM-ALBUTEROL 0.5-2.5 (3) MG/3ML IN SOLN
3.0000 mL | RESPIRATORY_TRACT | Status: DC
  Administered 2022-02-01 – 2022-02-03 (×10): 3 mL via RESPIRATORY_TRACT
  Filled 2022-02-01 (×9): qty 3

## 2022-02-01 MED ORDER — METHYLPREDNISOLONE SODIUM SUCC 40 MG IJ SOLR
40.0000 mg | Freq: Two times a day (BID) | INTRAMUSCULAR | Status: DC
  Administered 2022-02-01 – 2022-02-05 (×8): 40 mg via INTRAVENOUS
  Filled 2022-02-01 (×8): qty 1

## 2022-02-01 MED ORDER — DIPHENHYDRAMINE HCL 50 MG/ML IJ SOLN: 12.5000 mg | Freq: Three times a day (TID) | INTRAMUSCULAR | Status: DC | PRN

## 2022-02-01 MED ORDER — MOMETASONE FURO-FORMOTEROL FUM 100-5 MCG/ACT IN AERO
2.0000 | INHALATION_SPRAY | Freq: Two times a day (BID) | RESPIRATORY_TRACT | Status: DC
  Filled 2022-02-01: qty 8.8

## 2022-02-01 MED ORDER — METHOCARBAMOL 500 MG PO TABS: 500.0000 mg | ORAL_TABLET | Freq: Two times a day (BID) | ORAL | Status: DC | PRN

## 2022-02-01 MED ORDER — HYDROCODONE-ACETAMINOPHEN 5-325 MG PO TABS: 1.0000 | ORAL_TABLET | Freq: Three times a day (TID) | ORAL | Status: DC | PRN

## 2022-02-01 MED ORDER — SODIUM CHLORIDE 0.9 % IV SOLN
1.0000 g | Freq: Once | INTRAVENOUS | Status: DC
  Filled 2022-02-01: qty 10

## 2022-02-01 MED ORDER — IOHEXOL 350 MG/ML SOLN
75.0000 mL | Freq: Once | INTRAVENOUS | Status: AC | PRN
  Administered 2022-02-01: 75 mL via INTRAVENOUS

## 2022-02-01 MED ORDER — MAGNESIUM SULFATE 4 GM/100ML IV SOLN
4.0000 g | Freq: Once | INTRAVENOUS | Status: AC
  Administered 2022-02-01: 4 g via INTRAVENOUS
  Filled 2022-02-01: qty 100

## 2022-02-01 MED ORDER — DM-GUAIFENESIN ER 30-600 MG PO TB12: 1.0000 | ORAL_TABLET | Freq: Two times a day (BID) | ORAL | Status: DC | PRN

## 2022-02-01 NOTE — Assessment & Plan Note (Signed)
Body weight 58.1 kg, BMI 16.90  Nutrition consult  Ensure

## 2022-02-01 NOTE — Progress Notes (Signed)
Patient transported to ER via w/c on 2 L of oxygen. Port a cath - saline locked prior to transport. Oxygen sats at 98% on 2L.

## 2022-02-01 NOTE — Assessment & Plan Note (Addendum)
S/p of surgery, currently on chemo and immunotherapy with ramucirumab.  Last dose was 7/19  Follow-up with Dr. Tasia Catchings of oncology

## 2022-02-01 NOTE — Assessment & Plan Note (Addendum)
   Monitor BMP  Replete as needed

## 2022-02-01 NOTE — Assessment & Plan Note (Signed)
-  see above 

## 2022-02-01 NOTE — ED Provider Notes (Signed)
[facility] Provider Note    Event Date/Time   First MD Initiated Contact with Patient 02/01/22 1509     (approximate)   History   Shortness of Breath   HPI  Samuel Choi is a 56 y.o. male  who presents to the emergency department today from oncology clinic because of hypoxia. Patient was at oncology clinic today for follow up from visit yesterday. Went yesterday because of concern for shortness of breath, cough. Was started on oral antibiotics. Today he feels no improvement. At oncology clinic today was noted to be hypoxic so was put on 2L O2. Patient states he has not felt any improvement since being put on the oxygen. Denies any leg swelling or pain. Denies missing any doses of his eliquis.    Physical Exam   Triage Vital Signs: ED Triage Vitals [02/01/22 1202]  Enc Vitals Group     BP (!) 117/57     Pulse Rate 76     Resp 16     Temp 98.4 F (36.9 C)     Temp Source Oral     SpO2 94 %     Weight 128 lb 1.4 oz (58.1 kg)     Height 6\' 1"  (1.854 m)     Head Circumference      Peak Flow      Pain Score 0     Pain Loc      Pain Edu?      Excl. in [place]?     Most recent vital signs: Vitals:   02/01/22 1202 02/01/22 1422  BP: (!) 117/57 116/63  Pulse: 76 (!) 59  Resp: 16 20  Temp: 98.4 F (36.9 C) 98.4 F (36.9 C)  SpO2: 94% 94%    General: Awake, alert, oriented. CV:  Good peripheral perfusion. Tachycardia Resp:  Normal effort. Lungs clear. No wheezing or crackles appreciated. Abd:  No distention.  Other:  No lower extremity edema.    ED Results / Procedures / Treatments   Labs (all labs ordered are listed, but only abnormal results are displayed) Labs Reviewed  BRAIN NATRIURETIC PEPTIDE - Abnormal; Notable for the following components:      Result Value   B Natriuretic Peptide 920.5 (*)    All other components within normal limits     EKG  I, Nance Pear, attending physician, personally viewed and interpreted this  EKG  EKG Time: 1207 Rate: 122 Rhythm: sinus tachycardia Axis: normal Intervals: qtc 552 QRS: Narrow ST changes: no st elevation Impression: abnormal ekg   RADIOLOGY I independently interpreted and visualized the CXR. My interpretation: No pneumonia. No pneumothorax Radiology interpretation:  IMPRESSION:  There are no new infiltrates or signs of pulmonary edema.   CT angio IMPRESSION: 1. Bilateral chronic pulmonary embolus but no acute pulmonary embolus. 2. Probable pulmonary arterial hypertension. 3.  Aortic Atherosclerosis (ICD10-I70.0).  Coronary atherosclerosis. 4. Improved airspace opacity along the peripheral cavitary process in the right lower lobe, currently the thin walled air cyst or cavity measures about 1.4 cm in diameter. 5. Increased peripheral tree-in-bud reticulonodular opacities in both lower lobes in the lingula, compatible with atypical infection. 6. Airway thickening and airway plugging particularly in the right lower lobe. 7. Mosaic attenuation in the lungs, stable but nonspecific. 8.  Emphysema (ICD10-J43.9). 9. Stable radiation therapy related findings in the right perihilar regions. Right upper lobectomy.     PROCEDURES:  Critical Care performed: No  Procedures   MEDICATIONS ORDERED IN ED: Medications -  No data to display   IMPRESSION / MDM / [place] / ED COURSE  I reviewed the triage vital signs and the nursing notes.                              Differential diagnosis includes, but is not limited to, pneumonia, pneumothorax, PE.  Patient's presentation is most consistent with acute presentation with potential threat to life or bodily function.  Patient presented to the emergency department today because of concerns for shortness of breath and hypoxia from oncology clinic.  Patient does have a history of PEs.  CT angio was performed.  Did not show any acute PE but does show findings concerning for atypical infection.   Patient was started on IV antibiotics.  Discussed with Dr. Blaine Hamper with the hospitalist service who will plan on admission.  FINAL CLINICAL IMPRESSION(S) / ED DIAGNOSES   Final diagnoses:  Pneumonia due to infectious organism, unspecified laterality, unspecified part of lung     Note:  This document was prepared using Dragon voice recognition software and may include unintentional dictation errors.    Nance Pear, MD 02/01/22 [phone]

## 2022-02-01 NOTE — Assessment & Plan Note (Addendum)
Blood pressure is soft due to sepsis  Initially held home blood pressure medications   transition to metoprolol and Cardizem home medications 07/29

## 2022-02-01 NOTE — ED Notes (Signed)
This RN was contacted by the lab for critical glucose level of 42. This RN obtained CBG stat and the level was 103. Dinner tray at the bedside.

## 2022-02-01 NOTE — Consult Note (Signed)
Pharmacy Antibiotic Note  Samuel Choi is a 56 y.o. male admitted on 02/01/2022 with pneumonia.  Pharmacy has been consulted for cefepime and Vancomycin dosing. Patient started on PO levofloxacin PTA on 01/31/22  Plan: Cefepime 2 gram Q8H Initiate Vancomycin 1250 mg Q24H. Goal AUC 400-550 Estimated AUC 519 Scr 1.06, TBW, Vd 0.72    Height: 6\' 1"  (185.4 cm) Weight: 58.1 kg (128 lb 1.4 oz) IBW/kg (Calculated) : 79.9  Temp (24hrs), Avg:98.4 F (36.9 C), Min:98.2 F (36.8 C), Max:98.7 F (37.1 C)  Recent Labs  Lab 01/31/22 1323 02/01/22 1040  WBC 1.5* 7.6  CREATININE 1.05 1.06    Estimated Creatinine Clearance: 63.9 mL/min (by C-G formula based on SCr of 1.06 mg/dL).    No Known Allergies  Antimicrobials this admission: 7/26 cefepime >>  7/26 Vancomycin >>   Dose adjustments this admission:   Microbiology results: 7/26 BCx: sent 7/26 Sputum: sent  7/26 MRSA PCR: ordered   Thank you for allowing pharmacy to be a part of this patient's care.  Dorothe Pea, PharmD, BCPS Clinical Pharmacist   02/01/2022 6:45 PM

## 2022-02-01 NOTE — Assessment & Plan Note (Signed)
Magnesium 1.1  Give 4 g magnesium sulfate by IV  Check phosphorus level

## 2022-02-01 NOTE — Assessment & Plan Note (Signed)
CT 07/26: Bilateral chronic pulmonary embolus but no acute pulmonary embolus  Continue Eliquis

## 2022-02-01 NOTE — H&P (Signed)
History and Physical    Samuel Choi:[id number] DOB: February 28, 1966 DOA: 02/01/2022  Referring MD/NP/PA:   PCP: Earlie Server, MD   Patient coming from:  The patient is coming from home.  Chief Complaint: SOB  HPI: Samuel Choi is a 56 y.o. 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 difficile, PE on Eliquis, dCHF, who presents with shortness breath.  Patient states that she started having shortness breath since yesterday, which has been progressively worsening.  Patient has cough with clear mucus production.  Denies chest pain, fever or chills.  No nausea, vomiting, diarrhea or abdominal pain.  No symptoms of UTI.  Patient is not using oxygen normally at home.  Oxygen desaturation to 88% on room air, which improved to 96% on 2 L oxygen.  Patient is a taking Eliquis for PE, last dose was seen this morning.  Data reviewed independently and ED Course: pt was found to have WBC 7.6, BNP 928, sodium 129, potassium 3.4, magnesium 1.1, GFR> 60, temperature normal, blood pressure 88/5 7 --> 108/74, heart rate 45, 120, 118, RR 23.  Chest x-ray negative.  CTA negative for new acute PE, but showed infiltration and chronic PE.  Patient is admitted to PCU as inpatient,.  CTA: 1. Bilateral chronic pulmonary embolus but no acute pulmonary embolus. 2. Probable pulmonary arterial hypertension. 3.  Aortic Atherosclerosis (ICD10-I70.0).  Coronary atherosclerosis. 4. Improved airspace opacity along the peripheral cavitary process in the right lower lobe, currently the thin walled air cyst or cavity measures about 1.4 cm in diameter. 5. Increased peripheral tree-in-bud reticulonodular opacities in both lower lobes in the lingula, compatible with atypical infection. 6. Airway thickening and airway plugging particularly in the  right lower lobe. 7. Mosaic attenuation in the lungs, stable but nonspecific. 8.  Emphysema (ICD10-J43.9). 9. Stable radiation therapy related findings in the right perihilar regions. Right upper lobectomy.   EKG: I have personally reviewed.  Sinus rhythm, frequent PAC, QTc 552, early R wave progression, right atrial enlargement.  Review of Systems:   General: no fevers, chills, no body weight gain, has poor appetite, has fatigue HEENT: no blurry vision, hearing changes or sore throat Respiratory: has dyspnea, coughing, no wheezing CV: no chest pain, no palpitations GI: no nausea, vomiting, abdominal pain, diarrhea, constipation GU: no dysuria, burning on urination, increased urinary frequency, hematuria  Ext: no leg edema Neuro: no unilateral weakness, numbness, or tingling, no vision change or hearing loss Skin: no rash, no skin tear. MSK: No muscle spasm, no deformity, no limitation of range of movement in spin Heme: No easy bruising.  Travel history: No recent long distant travel.   Allergy: No Known Allergies  Past Medical History:  Diagnosis Date   Alcohol abuse    usually drinks 2-3 drinks per day   Atherosclerosis 06/2018   Chronic sinusitis    Dehydration 02/07/2019   Emphysema of lung ([place]) 06/2018   patient unaware of this.   Hip fracture ([place]) 05/2018   no surgery   History of kidney stones 05/2018   per xray, bilateral nephrolitiasis   Hypertension    Squamous cell carcinoma of lung, right ([place]) 06/2018    Past Surgical History:  Procedure Laterality Date   BRAIN SURGERY  10/2017   nasal/sinus endoscopy. mass benign   ELECTROMAGNETIC NAVIGATION BROCHOSCOPY Right 06/25/2018   Procedure: ELECTROMAGNETIC NAVIGATION BRONCHOSCOPY;  Surgeon:  [name], MD;  Location: [facility];  Service: Cardiopulmonary;  Laterality: Right;   ENDOBRONCHIAL ULTRASOUND Right 06/25/2018   Procedure: ENDOBRONCHIAL ULTRASOUND;  Surgeon: [name], MD;  Location: [facility];   Service: Cardiopulmonary;  Laterality: Right;   ENDOBRONCHIAL ULTRASOUND Right 12/26/2018   Procedure: ENDOBRONCHIAL ULTRASOUND RIGHT;  Surgeon: [name], MD;  Location: [facility];  Service: Cardiopulmonary;  Laterality: Right;   FLEXIBLE BRONCHOSCOPY N/A 08/20/2018   Procedure: FLEXIBLE BRONCHOSCOPY PREOP;  Surgeon: [name], MD;  Location: [facility];  Service: Thoracic;  Laterality: N/A;   IR CV LINE INJECTION  04/04/2019   NASAL SINUS SURGERY  10/2017   At [facility], frontal sinusotomy, ethmoidectomy, resection anterior cranial fossa neoplasm, turbinate resection   PERIPHERAL VASCULAR THROMBECTOMY Left 12/31/2020   Procedure: PERIPHERAL VASCULAR THROMBECTOMY;  Surgeon: Algernon Huxley, MD;  Location: [facility];  Service: Cardiovascular;  Laterality: Left;   PORTACATH PLACEMENT Left 01/15/2019   Procedure: INSERTION PORT-A-CATH;  Surgeon: [name], MD;  Location: [facility];  Service: General;  Laterality: Left;   THORACOTOMY Right 08/13/2018   Procedure: PREOP BROCHOSCOPY WITH RIGHT THORACOTOMY AND RUL RESECTION;  Surgeon: [name], MD;  Location: [facility];  Service: General;  Laterality: Right;   THORACOTOMY Right 08/20/2018   Procedure: THORACOTOMY MAJOR RIGHT UPPER LOBE LOBECTOMY;  Surgeon: [name], MD;  Location: [facility];  Service: Thoracic;  Laterality: Right;   TOE SURGERY Left    pin in left toe    Social History:  reports that he quit smoking about 3 years ago. His smoking use included cigarettes. He has a 7.50 pack-year smoking history. He has never used smokeless tobacco. He reports current alcohol use of about 3.0 standard drinks of alcohol per week. He reports that he does not use drugs.  Family History:  Family History  Problem Relation Age of Onset   Breast cancer Mother    Diabetes Mother    Lung cancer Father    Hypertension Father      Prior to Admission medications   Medication Sig Start Date End Date Taking? Authorizing Provider  albuterol  (VENTOLIN HFA) 108 (90 Base) MCG/ACT inhaler INHALE 2 PUFFS BY MOUTH EVERY 6 HOURS AS NEEDED FOR WHEEZE OR SHORTNESS OF BREATH 10/27/21   Earlie Server, MD  apixaban (ELIQUIS) 2.5 MG TABS tablet Take 1 tablet (2.5 mg total) by mouth 2 (two) times daily. 10/19/21   Earlie Server, MD  benzonatate (TESSALON) 100 MG capsule Take 1 capsule (100 mg total) by mouth 3 (three) times daily as needed for cough. 01/31/22   Borders, Kirt Boys, NP  budesonide-formoterol (SYMBICORT) 80-4.5 MCG/ACT inhaler Inhale 2 puffs into the lungs 2 (two) times daily. in the morning and at bedtime. 10/19/21   Earlie Server, MD  diltiazem (CARDIZEM CD) 120 MG 24 hr capsule Take 120 mg by mouth daily. 06/19/19   [provider]  HYDROcodone-acetaminophen (NORCO/VICODIN) 5-325 MG tablet Take 1 tablet by mouth every 8 (eight) hours as needed for severe pain. 10/12/21   Borders, Kirt Boys, NP  latanoprost (XALATAN) 0.005 % ophthalmic solution SMARTSIG:In Eye(s) 05/04/21   [provider]  levofloxacin (LEVAQUIN) 750 MG tablet Take 1 tablet (750 mg total) by mouth daily. 01/31/22   Borders, Kirt Boys, NP  loperamide (IMODIUM) 2 MG capsule Take 1 capsule (2 mg total) by mouth See admin instructions. Initial: 4 mg, followed by 2 mg after each loose stool; maximum: 16 mg/day Patient not taking: Reported on 12/16/2021 10/05/21   Tasia Catchings,  Talbert Cage, MD  loratadine (CLARITIN) 10 MG tablet Take 10 mg by mouth daily.    [provider]  methocarbamol (ROBAXIN) 500 MG tablet Take 1 tablet (500 mg total) by mouth at bedtime. 01/25/22   Earlie Server, MD  metoprolol succinate (TOPROL-XL) 25 MG 24 hr tablet Take 1 tablet (25 mg total) by mouth daily. 01/02/21   Loletha Grayer, MD  nystatin (MYCOSTATIN) 100000 UNIT/ML suspension Take 5 mLs (500,000 Units total) by mouth 4 (four) times daily. Swish and swallow. 05/17/21   Earlie Server, MD  olmesartan (BENICAR) 40 MG tablet Take 40 mg by mouth every other day. 08/28/17   [provider]  ondansetron (ZOFRAN) 8  MG tablet Take 1 tablet (8 mg total) by mouth every 8 (eight) hours as needed for refractory nausea / vomiting. Start on day 3 after chemo. 01/03/22   Earlie Server, MD  pantoprazole (PROTONIX) 20 MG tablet Take 1 tablet (20 mg total) by mouth daily. 10/27/21   Earlie Server, MD  potassium chloride (KLOR-CON) 10 MEQ tablet Take 10 mEq by mouth daily. Start taking 01/14/22    [provider]  predniSONE (DELTASONE) 20 MG tablet Take 1 tablet (20 mg total) by mouth daily with breakfast. 01/31/22   Borders, Kirt Boys, NP  promethazine (PHENERGAN) 25 MG tablet Take 1 tablet (25 mg total) by mouth every 6 (six) hours as needed for nausea or vomiting. 06/13/21   Earlie Server, MD  SPIRIVA HANDIHALER 18 MCG inhalation capsule INHALE 1 CAPSULE VIA HANDIHALER ONCE DAILY AT THE SAME TIME EVERY DAY 04/08/20   Earlie Server, MD  vitamin B-12 (CYANOCOBALAMIN) 1000 MCG tablet Take 1 tablet (1,000 mcg total) by mouth daily. 08/04/19   Earlie Server, MD    Physical Exam: Vitals:   02/01/22 1504 02/01/22 1600 02/01/22 1700 02/01/22 1800  BP: 125/69 103/70 108/74   Pulse: (!) 118 (!) 120 (!) 118 80  Resp: (!) _0 Temp:    98.2 F (36.8 C)  TempSrc:    Oral  SpO2: 94% 96% 96% 93%  Weight:      Height:       General: Not in acute distress.  Thin body habitus HEENT:       Eyes: PERRL, EOMI, no scleral icterus.       ENT: No discharge from the ears and nose, no pharynx injection, no tonsillar enlargement.        Neck: No JVD, no bruit, no mass felt. Heme: No neck lymph node enlargement. Cardiac: S1/S2, RRR, No murmurs, No gallops or rubs. Respiratory: has fine crackles and rhonchi bilaterally GI: Soft, nondistended, nontender, no rebound pain, no organomegaly, BS present. GU: No hematuria Ext: No pitting leg edema bilaterally. 1+DP/PT pulse bilaterally. Musculoskeletal: No joint deformities, No joint redness or warmth, no limitation of ROM in spin. Skin: No rashes.  Neuro: Alert, oriented X3, cranial nerves II-XII  grossly intact, moves all extremities normally. Psych: Patient is not psychotic, no suicidal or hemocidal ideation.  Labs on Admission: I have personally reviewed following labs and imaging studies  CBC: Recent Labs  Lab 01/31/22 1323 02/01/22 1040  WBC 1.5* 7.6  NEUTROABS 0.6* 6.0  HGB 11.8* 10.7*  HCT 33.2* 30.3*  MCV 101.8* 100.7*  PLT 78* 56*   Basic Metabolic Panel: Recent Labs  Lab 01/31/22 1323 02/01/22 1040  NA 130* 129*  K 4.0 3.4*  CL 94* 97*  CO2 25 21*  GLUCOSE 79 62*  BUN 20 21*  CREATININE  1.05 1.06  CALCIUM 8.2* 7.7*  MG  --  1.1*   GFR: Estimated Creatinine Clearance: 63.9 mL/min (by C-G formula based on SCr of 1.06 mg/dL). Liver Function Tests: Recent Labs  Lab 01/31/22 1323  AST 15  ALT 10  ALKPHOS 76  BILITOT 1.2  PROT 6.4*  ALBUMIN 2.8*   No results for input(s): "LIPASE", "AMYLASE" in the last 168 hours. No results for input(s): "AMMONIA" in the last 168 hours. Coagulation Profile: No results for input(s): "INR", "PROTIME" in the last 168 hours. Cardiac Enzymes: No results for input(s): "CKTOTAL", "CKMB", "CKMBINDEX", "TROPONINI" in the last 168 hours. BNP (last 3 results) No results for input(s): "PROBNP" in the last 8760 hours. HbA1C: No results for input(s): "HGBA1C" in the last 72 hours. CBG: No results for input(s): "GLUCAP" in the last 168 hours. Lipid Profile: No results for input(s): "CHOL", "HDL", "LDLCALC", "TRIG", "CHOLHDL", "LDLDIRECT" in the last 72 hours. Thyroid Function Tests: No results for input(s): "TSH", "T4TOTAL", "FREET4", "T3FREE", "THYROIDAB" in the last 72 hours. Anemia Panel: No results for input(s): "VITAMINB12", "FOLATE", "FERRITIN", "TIBC", "IRON", "RETICCTPCT" in the last 72 hours. Urine analysis:    Component Value Date/Time   COLORURINE Yellow 11/02/2014 1900   APPEARANCEUR Hazy 11/02/2014 1900   LABSPEC 1.013 11/02/2014 1900   PHURINE 9.0 11/02/2014 1900   GLUCOSEU Negative 11/02/2014 1900    HGBUR 1+ 11/02/2014 1900   BILIRUBINUR Negative 11/02/2014 1900   KETONESUR Negative 11/02/2014 1900   PROTEINUR 30 mg/dL 11/02/2014 1900   NITRITE Negative 11/02/2014 1900   LEUKOCYTESUR 3+ 11/02/2014 1900   Sepsis Labs: _0 (procalcitonin:4,lacticidven:4) )No results found for this or any previous visit (from the past 240 hour(s)).   Radiological Exams on Admission: CT Angio Chest PE W and/or Wo Contrast  Result Date: 02/01/2022 CLINICAL DATA:  Lung cancer.  Shortness of breath and hypoxia. * Tracking Code: BO * EXAM: CT ANGIOGRAPHY CHEST WITH CONTRAST TECHNIQUE: Multidetector CT imaging of the chest was performed using the standard protocol during bolus administration of intravenous contrast. Multiplanar CT image reconstructions and MIPs were obtained to evaluate the vascular anatomy. RADIATION DOSE REDUCTION: This exam was performed according to the departmental dose-optimization program which includes automated exposure control, adjustment of the mA and/or kV according to patient size and/or use of iterative reconstruction technique. CONTRAST:  39m OMNIPAQUE IOHEXOL 350 MG/ML SOLN COMPARISON:  PET-CT 11/21/2021 and chest CT 11/15/2021 FINDINGS: Cardiovascular: Left Port-A-Cath tip: Distal SVC. Possible fibrin sheath distally. Scattered findings of chronic pulmonary embolus. This includes in the left pulmonary artery on image 185 where there are peripheral string like filling defects; the peripheral chronic filling defect along the margin of the right lower lobe pulmonary artery on image 213 series 7; an adjacent web-like filling defect in a segmental branch of the right lower lobe pulmonary artery on image 212 of series 7; and a web like filling defect at the origin of a left lower lobe segmental branch on image 260 series 7. These all have characteristic appearance of chronic embolus, and I do not discern current findings of acute pulmonary embolus or substantial worsening of appearance  compared to prior exams. Mild prominence the main pulmonary artery at 3.7 cm diameter, potentially reflecting pulmonary arterial hypertension. Coronary, aortic arch, and branch vessel atherosclerotic vascular disease. Mediastinum/Nodes: No pathologic adenopathy. Lungs/Pleura: Right upper lobectomy. Stable radiation therapy related findings in the right perihilar region. Improved airspace opacity around peripheral cavitary process in the right lower lobe, currently the cavity or air cyst measures 1.4  by 1.4 cm on image 86 of series 6 and has wall thickness of about 1-2 mm. Similarly mild wall thickening associated with a second bulla or small cavity more medially on image 87 series 6. Patchy peripheral mainly tree-in-bud reticulonodular opacities in the posterior basal segment right lower lobe, images 95 through 135 series 6, favoring atypical infection. Airway thickening and some airway plugging in the right lower lobe. Mosaic attenuation in both lungs. Underlying emphysema. Mild tree-in-bud reticulonodular opacities increased in the posterior basal segment left lower lobe and in the lingula. Upper Abdomen: Unremarkable Musculoskeletal: Unremarkable Review of the MIP images confirms the above findings. IMPRESSION: 1. Bilateral chronic pulmonary embolus but no acute pulmonary embolus. 2. Probable pulmonary arterial hypertension. 3.  Aortic Atherosclerosis (ICD10-I70.0).  Coronary atherosclerosis. 4. Improved airspace opacity along the peripheral cavitary process in the right lower lobe, currently the thin walled air cyst or cavity measures about 1.4 cm in diameter. 5. Increased peripheral tree-in-bud reticulonodular opacities in both lower lobes in the lingula, compatible with atypical infection. 6. Airway thickening and airway plugging particularly in the right lower lobe. 7. Mosaic attenuation in the lungs, stable but nonspecific. 8.  Emphysema (ICD10-J43.9). 9. Stable radiation therapy related findings in the  right perihilar regions. Right upper lobectomy. Electronically Signed   By: Van Clines M.D.   On: 02/01/2022 17:21   DG Chest 2 View  Result Date: 02/01/2022 CLINICAL DATA:  Shortness of breath EXAM: CHEST - 2 VIEW COMPARISON:  Previous studies including examination of 2022/02/05 FINDINGS: Cardiac size is within normal limits. There is decreased volume right lung. Linear densities are seen in right parahilar region and right lower lung field with no significant change suggesting scarring. Blunting of right lateral CP angle may suggest pleural thickening. There are no new infiltrates or signs of pulmonary edema. There is no pneumothorax. Tip of chest port is seen in the course of the superior vena cava. IMPRESSION: There are no new infiltrates or signs of pulmonary edema. Electronically Signed   By: Elmer Picker M.D.   On: 02/01/2022 12:37   DG Chest 2 View  Result Date: 05-Feb-2022 CLINICAL DATA:  Persistent cough EXAM: CHEST - 2 VIEW COMPARISON:  02/04/2020 FINDINGS: Postsurgical changes of right upper lobectomy with interstitial thickening and scarring. Unchanged blunting of the right costophrenic angle, likely scarring. Unchanged cardiac and mediastinal contours. Left chest port with catheter tip in the SVC. The left lung is clear. No acute osseous abnormality. IMPRESSION: Stable chest radiograph. Electronically Signed   By: Merilyn Baba M.D.   On: 02-05-22 13:16      Assessment/Plan Principal Problem:   HCAP (healthcare-associated pneumonia) Active Problems:   COPD exacerbation ([place])   Sepsis ([place])   Squamous cell carcinoma of right lung (HCC)   Essential hypertension   Pulmonary embolus (HCC)   Hypokalemia   Hypomagnesemia   Hyponatremia   Severe protein-calorie malnutrition (HCC)   Thrombocytopenia (HCC)   Chronic diastolic CHF (congestive heart failure) (HCC)   Macrocytic anemia   Assessment and Plan: * HCAP (healthcare-associated pneumonia) Sepsis due to HCAP  and COPD exacerbation: pt meets criteria for sepsis with heart rate 120, RR 23.  Pending lactic acid level.  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  -Admited to progressive unit as inpatient - IV Vancomycin and cefepime - Incentive spirometry - Solu-Medrol 40 mg twice daily - Mucinex for cough  - Bronchodilators - Urine legionella and S. pneumococcal antigen - Follow up blood culture x2, sputum culture - f/u  [id number] PCR - will get Procalcitonin and trend lactic acid level per sepsis protocol - IVF: 500 ml of NS bolus in ED (patient has diastolic congestive heart failure with elevated BNP 920, limiting aggressive IV fluids treatment)  COPD exacerbation (HCC) -see above  Sepsis ([place]) -see above  Squamous cell carcinoma of right lung ([place]) S/p of surgery, currently on chemo and immunotherapy with ramucirumab.  Last dose was 7/19 -Follow-up with Dr. Tasia Catchings of oncology  Essential hypertension Blood pressure is soft due to sepsis -Hold home blood pressure medications   Pulmonary embolus (HCC) - Eliquis  Hypokalemia Potassium 3.4 -Repleted potassium  Hypomagnesemia Magnesium 1.1 -Give 4 g magnesium sulfate by IV -Check phosphorus level  Hyponatremia Sodium 129, mental status normal.  Likely due to poor oral intake - Will check urine sodium, urine osmolality, serum osmolality. - Fluid restriction - IVF:500 mL NS - f/u by BMP q8h -Sodium chloride tablet 2 g twice daily - avoid over correction too fast due to risk of central pontine myelinolysis   Severe protein-calorie malnutrition (HCC) Body weight 58.1 kg, BMI 16.90 - Nutrition consult -Ensure  Thrombocytopenia (HCC) Platelet 56, no active bleeding.  Likely due to chemotherapy -Follow-up with CBC  Chronic diastolic CHF (congestive heart failure) ([place]) 2D echo 01/01/2021 showed EF of 60 to 65% with grade 1 diastolic dysfunction.  Patient has elevated BNP at 920, but no leg edema or JVD.  Chest x-ray  negative for infiltration.  Does not seem to have CHF exacerbation -Watch volume status closely  Macrocytic anemia Hemoglobin 10.7.  Likely due to chemotherapy -Follow-up with CBC          DVT ppx: on Eliquis  Code Status: Full code  Family Communication:  Yes, patient's  wife  by phone  Disposition Plan:  Anticipate discharge back to previous environment  Consults called:  none  Admission status and Level of care: Progressive:    as inpt      Severity of Illness:  The appropriate patient status for this patient is INPATIENT. Inpatient status is judged to be reasonable and necessary in order to provide the required intensity of service to ensure the patient's safety. The patient's presenting symptoms, physical exam findings, and initial radiographic and laboratory data in the context of their chronic comorbidities is felt to place them at high risk for further clinical deterioration. Furthermore, it is not anticipated that the patient will be medically stable for discharge from the hospital within 2 midnights of admission.   * I certify that at the point of admission it is my clinical judgment that the patient will require inpatient hospital care spanning beyond 2 midnights from the point of admission due to high intensity of service, high risk for further deterioration and high frequency of surveillance required.*       Date of Service 02/01/2022    Ivor Costa [facility]   If 7PM-7AM, please contact night-coverage [email or web address] 02/01/2022, 6:58 PM

## 2022-02-01 NOTE — Telephone Encounter (Signed)
I contacted the patient at 938 am today to inquire if he had taken his home covid-19 test. He had not yet done so. I asked the patient to take this test now and I will call him back in about 20 mins as in general, the test takes 10-15 mins to result. He stated that he would take the test swab and get his wife to help him.

## 2022-02-01 NOTE — Progress Notes (Signed)
Symptom Management and [place] at [facility] [phone] [phone]  Patient Care Team: Earlie Server, MD as PCP - General (Oncology) Earlie Server, MD as Consulting Physician (Hematology and Oncology)   NAME OF PATIENT: Samuel Choi  [id number]  May 11, 1966   DATE OF VISIT: 02/01/22   REASON FOR CONSULT: Samuel Choi is a 56 y.o. 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  He was initially diagnosed with stage I in 2020 and underwent lobectomy but had disease recurrence.  He is post Taxol Keytruda, patient had progression and poor tolerance due to myelosuppression.  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 Most recently, patient has been on docetaxel.    INTERVAL HISTORY: Patient was seen in [facility] on 01/31/2022 with cough, congestion, shortness of breath.  He has stable chest x-ray with symptoms/neutropenia concerning for infection.  He declined hospitalization.  Patient was started on Levaquin, prednisone, and benzonatate.   Decision was made to follow patient daily in clinic until clinical improvement.   Patient returns clinic today for follow-up.  Today, patient reports that he feels no improvement.  He continues to endorse shortness of breath, fatigue, productive cough.  Denies fever or chills but overall feels poorly.  Denies any neurologic complaints. Denies any easy bleeding or bruising. Reports poor appetite and denies weight loss. Denies chest pain. Denies any nausea, vomiting, constipation, or diarrhea. Denies urinary complaints. Patient offers no further specific complaints today.  SOCIAL HISTORY:     reports that he quit smoking about 3 years ago. His smoking use included cigarettes. He has a 7.50 pack-year smoking history. He has never used smokeless tobacco.  He reports current alcohol use of about 3.0 standard drinks of alcohol per week. He reports that he does not use drugs.  ADVANCE DIRECTIVES:    CODE STATUS:    PAST MEDICAL HISTORY: Past Medical History:  Diagnosis Date   Alcohol abuse    usually drinks 2-3 drinks per day   Atherosclerosis 06/2018   Chronic sinusitis    Dehydration 02/07/2019   Emphysema of lung ([place]) 06/2018   patient unaware of this.   Hip fracture ([place]) 05/2018   no surgery   History of kidney stones 05/2018   per xray, bilateral nephrolitiasis   Hypertension    Squamous cell carcinoma of lung, right ([place]) 06/2018    PAST SURGICAL HISTORY:  Past Surgical History:  Procedure Laterality Date   BRAIN SURGERY  10/2017   nasal/sinus endoscopy. mass benign   ELECTROMAGNETIC NAVIGATION BROCHOSCOPY Right 06/25/2018   Procedure: ELECTROMAGNETIC NAVIGATION BRONCHOSCOPY;  Surgeon: [name], MD;  Location: [facility];  Service: Cardiopulmonary;  Laterality: Right;   ENDOBRONCHIAL ULTRASOUND Right 06/25/2018   Procedure: ENDOBRONCHIAL ULTRASOUND;  Surgeon: [name], MD;  Location: [facility];  Service: Cardiopulmonary;  Laterality: Right;   ENDOBRONCHIAL ULTRASOUND Right 12/26/2018   Procedure: ENDOBRONCHIAL ULTRASOUND RIGHT;  Surgeon: [name], MD;  Location: [facility];  Service: Cardiopulmonary;  Laterality: Right;   FLEXIBLE BRONCHOSCOPY N/A 08/20/2018   Procedure: FLEXIBLE BRONCHOSCOPY PREOP;  Surgeon: [name], MD;  Location: [facility];  Service: Thoracic;  Laterality: N/A;   IR CV LINE INJECTION  04/04/2019   NASAL SINUS SURGERY  10/2017   At [facility], frontal sinusotomy, ethmoidectomy, resection anterior cranial fossa neoplasm, turbinate resection   PERIPHERAL VASCULAR THROMBECTOMY Left 12/31/2020   Procedure: PERIPHERAL VASCULAR THROMBECTOMY;  Surgeon:  Algernon Huxley, MD;  Location: [facility];  Service: Cardiovascular;  Laterality: Left;   PORTACATH PLACEMENT Left 01/15/2019   Procedure: INSERTION  PORT-A-CATH;  Surgeon: [name], MD;  Location: [facility];  Service: General;  Laterality: Left;   THORACOTOMY Right 08/13/2018   Procedure: PREOP BROCHOSCOPY WITH RIGHT THORACOTOMY AND RUL RESECTION;  Surgeon: [name], MD;  Location: [facility];  Service: General;  Laterality: Right;   THORACOTOMY Right 08/20/2018   Procedure: THORACOTOMY MAJOR RIGHT UPPER LOBE LOBECTOMY;  Surgeon: [name], MD;  Location: [facility];  Service: Thoracic;  Laterality: Right;   TOE SURGERY Left    pin in left toe    HEMATOLOGY/ONCOLOGY HISTORY:  Oncology History  Squamous cell carcinoma of right lung ([place])  08/13/2018 Surgery   s/p Bronchoscopy on 06/25/2018. subcarina EBUS FNA was non diagnostic, hypocellular specimen.  08/13/18 Patient underwent right thoracotomy and wedge resection of a right upper lobe mass.  Frozen section was consistent with an inflammatory process.  On the second postop day, preliminary pathology reports high-grade malignancy.  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  08/20/2018 Patient was therefore taken back to the OR and underwent take complete lobectomy  pT1b pN0 cM0 stage I squamous cell lung cancer. Margin is negative.  Recommend observation.     08/13/2018 Initial Diagnosis   Stage I Squamous cell carcinoma of lung, right ([place])   12/03/2018 Progression   12/03/2018 CT chest showed abnormal soft tissue in the right suprahilar region measuring up to 2.5 cm, worrisome for recurrence 0/08/7251 PET Hypermetabolic RIGHT suprahilar mass consistent with lung cancer, recurrence. 12/26/2018 s/p bronchoscopy biopsy. Confirmed local recurrence of squamous cell lung cancer.  medi port placed by Dr.Oaks. 01/20/19 MRI brain w/wo contrast: No evidence of metastatic disease   01/17/2019 - 03/07/2019 Chemotherapy   concurrent chemoradiation on 01/16/2019 Carboplatin AUC of 2 and Taxol 45 mg/m2 weekly finished in 03/05/2019    03/31/2019 Imaging   03/31/19 CT chest showed Right suprahilar  lesion has decreased slightly in size in the interval since prior PET-CT.  No new or progressive findings on today's study.   04/10/2019 - 05/20/2020 Chemotherapy   04/10/2019, patient started on durvalumab Q 2 weeks maintenance.    06/14/2020 Progression   06/14/2020 CT chest w contrast showed vidence of progressive lymphangitic spread of tumor throughout the right lung, with contralateral lung nodules with  right pleural effusion  07/01/22 MRI brain is negative for CNS involvement.  CT abdomen with contrast showed no evidence of abdominal metastatic disease PET scan was not approved by insurance-peer to peer appeal with Dr.Vipul Bhanderi    07/08/2020 Cancer Staging   Staging form: Lung, AJCC 8th Edition - Clinical stage from 07/08/2020: Stage IVA (rcT4, cM1a) - Signed by Earlie Server, MD on 07/08/2020   07/16/2020 - 09/28/2021 Chemotherapy   Carboplatin + Paclitaxel + Pembrolizumab q21d x 4 cycles      10/15/2020 Imaging   10/15/2020 CT chest abdomen pelvis redemonstrated postoperative and postradiation appearance of the right chest with perihilar consolidation and fibrosis.  Slight interval decrease in size of the pulmonary nodules associated with interlobular septal thickening nearly complete resolution of previously seen left-sided pulmonary nodules.  Minimal irregular residual.  Right pleural effusion improved.  Consistent with treatment response.  No evidence of metastatic disease with the abdomen or pelvis.  None obstructive bilateral nephrolithiasis  - partial response to the treatment-  12/15/2020, CT chest without contrast showed resolution of lung nodules, no evidence of recurrent  disease   10/19/2020 -  Chemotherapy   Keytruda monotherapy maintenance   12/30/2020 [facility] Admission   patient was admitted due to unprovoked extensive deep vein thrombosis from calf vein to the common femoral vein on the left.  Patient was started on heparin drip.  Patient underwent thrombolysis and is a  colectomy on 12/31/2020.  Anticoagulation was switched to Eliquis at discharge   04/22/2021 Imaging   CT chest with contrast showed none occlusive pulmonary embolism, Interval progression of consolidation airspace opacity in the medial right middle lobe with bandlike opacity and bronchiectasis in the perihilar right lung,-this is compatible with evolving posttreatment changes although recurrent disease anteriorly is not excluded.Interval development of numerous bilateral pulmonary nodules, measuring up to 11 mm in the right lung base.  Concerning for metastatic disease.  Tiny right pleural effusion.    04/26/2021 -  Chemotherapy   resumed on carboplatin/Taxol/Keytruda. Patient did not tolerate his chemotherapy of carboplatin/Taxol/Keytruda on 04/26/2021. Patient has developed severe anemia status post PRBC transfusion. 05/25/2021, carboplatin AUC 5 with Keytruda 06/23/2021  continued on carboplatin AUC 5 with Keytruda 07/07/2021 carboplatin with [name]      07/15/2021 Progression   07/15/2021 CT chest with contrast showed enlarged right lower lobe thick-walled cavitary lesion in the anterior aspect of the right lower lobe.  Concerning for progression.  Multiple other small pulmonary nodules are also noted elsewhere in the lungs, many of which are stable compared to prior studies.  Several are new or clearly enlarged.  The best example is an enlarging pulmonary nodule in the left upper lobe, 8 x 6 mm comparing to 4 x 3 mm on 04/22/2021   08/03/2021 -  Chemotherapy   Patient is on Treatment Plan :  LUNG Docetaxel + Ramucirumab q21d      11/16/2021 Imaging   1. The previously noted thick-walled cavity of concern in the right lower lobe now appears more simple and thin-walled in appearance, presumably a post infectious cavity. There is a new adjacent thick-walled cavity in the right lower lobe which is aggressive in appearance, but given the evolution of the previous lesion, this may also be of  infectious etiology. 2. However, there is an enlarging mixed cystic and solid nodule in the left upper lobe which is concerning for metastatic lesion or new primary lesion. This currently measures 11 x 5 x 10 mm. Close attention on follow-up studies is recommended. 3. No definite signs of metastatic disease to the abdomen or pelvis. 4. Extensive colonic diverticulosis most involved in the sigmoid colon where there appear to be several small diverticular abscesses. one of these appears to fistulized into the wall of the urinary bladder, without frank fistulization to the lumen of the urinary bladder at this time.      ALLERGIES:  has No Known Allergies.  MEDICATIONS:  Current Outpatient Medications  Medication Sig Dispense Refill   albuterol (VENTOLIN HFA) 108 (90 Base) MCG/ACT inhaler INHALE 2 PUFFS BY MOUTH EVERY 6 HOURS AS NEEDED FOR WHEEZE OR SHORTNESS OF BREATH 8.5 g 5   apixaban (ELIQUIS) 2.5 MG TABS tablet Take 1 tablet (2.5 mg total) by mouth 2 (two) times daily. 60 tablet 6   benzonatate (TESSALON) 100 MG capsule Take 1 capsule (100 mg total) by mouth 3 (three) times daily as needed for cough. 20 capsule 0   budesonide-formoterol (SYMBICORT) 80-4.5 MCG/ACT inhaler Inhale 2 puffs into the lungs 2 (two) times daily. in the morning and at bedtime. 10.2 each 12   diltiazem (  CARDIZEM CD) 120 MG 24 hr capsule Take 120 mg by mouth daily.     HYDROcodone-acetaminophen (NORCO/VICODIN) 5-325 MG tablet Take 1 tablet by mouth every 8 (eight) hours as needed for severe pain. 30 tablet 0   latanoprost (XALATAN) 0.005 % ophthalmic solution SMARTSIG:In Eye(s)     levofloxacin (LEVAQUIN) 750 MG tablet Take 1 tablet (750 mg total) by mouth daily. 10 tablet 0   loperamide (IMODIUM) 2 MG capsule Take 1 capsule (2 mg total) by mouth See admin instructions. Initial: 4 mg, followed by 2 mg after each loose stool; maximum: 16 mg/day (Patient not taking: Reported on 12/16/2021) 60 capsule 0   loratadine (CLARITIN)  10 MG tablet Take 10 mg by mouth daily.     methocarbamol (ROBAXIN) 500 MG tablet Take 1 tablet (500 mg total) by mouth at bedtime. 30 tablet 0   metoprolol succinate (TOPROL-XL) 25 MG 24 hr tablet Take 1 tablet (25 mg total) by mouth daily. 30 tablet 0   nystatin (MYCOSTATIN) 100000 UNIT/ML suspension Take 5 mLs (500,000 Units total) by mouth 4 (four) times daily. Swish and swallow. 473 mL 1   olmesartan (BENICAR) 40 MG tablet Take 40 mg by mouth every other day.     ondansetron (ZOFRAN) 8 MG tablet Take 1 tablet (8 mg total) by mouth every 8 (eight) hours as needed for refractory nausea / vomiting. Start on day 3 after chemo. 90 tablet 1   pantoprazole (PROTONIX) 20 MG tablet Take 1 tablet (20 mg total) by mouth daily. 60 tablet 2   potassium chloride (KLOR-CON) 10 MEQ tablet Take 10 mEq by mouth daily. Start taking 01/14/22     predniSONE (DELTASONE) 20 MG tablet Take 1 tablet (20 mg total) by mouth daily with breakfast. 7 tablet 0   promethazine (PHENERGAN) 25 MG tablet Take 1 tablet (25 mg total) by mouth every 6 (six) hours as needed for nausea or vomiting. 30 tablet 0   SPIRIVA HANDIHALER 18 MCG inhalation capsule INHALE 1 CAPSULE VIA HANDIHALER ONCE DAILY AT THE SAME TIME EVERY DAY 30 capsule 0   vitamin B-12 (CYANOCOBALAMIN) 1000 MCG tablet Take 1 tablet (1,000 mcg total) by mouth daily. 90 tablet 1   No current facility-administered medications for this visit.   Facility-Administered Medications Ordered in Other Visits  Medication Dose Route Frequency Provider Last Rate Last Admin   dexamethasone (DECADRON) 10 MG/ML injection            heparin lock flush 100 unit/mL  500 Units Intravenous Once Earlie Server, MD       heparin lock flush 100 unit/mL  500 Units Intravenous Once Earlie Server, MD       heparin lock flush 100 unit/mL  500 Units Intravenous Once Covington, Sarah M, PA-C       sodium chloride flush (NS) 0.9 % injection 10 mL  10 mL Intravenous PRN Earlie Server, MD   10 mL at 04/04/19 0903    sodium chloride flush (NS) 0.9 % injection 10 mL  10 mL Intravenous PRN Earlie Server, MD   10 mL at 07/26/20 1308   sodium chloride flush (NS) 0.9 % injection 10 mL  10 mL Intravenous PRN Earlie Server, MD   10 mL at 07/28/21 0828   sodium chloride flush (NS) 0.9 % injection 10 mL  10 mL Intravenous Once Covington, Sarah M, PA-C        VITAL SIGNS: There were no vitals taken for this visit. There were no vitals filed for  this visit.  Estimated body mass index is 16.89 kg/m as calculated from the following:   Height as of 01/27/22: _0  (1.854 m).   Weight as of 01/27/22: 128 lb (58.1 kg).  LABS: CBC:    Component Value Date/Time   WBC 1.5 (L) 01/31/2022 1323   HGB 11.8 (L) 01/31/2022 1323   HGB 15.9 11/02/2014 1859   HCT 33.2 (L) 01/31/2022 1323   HCT 46.0 11/02/2014 1859   PLT 78 (L) 01/31/2022 1323   PLT 143 (L) 11/02/2014 1859   MCV 101.8 (H) 01/31/2022 1323   MCV 100 11/02/2014 1859   NEUTROABS 0.6 (L) 01/31/2022 1323   NEUTROABS 9.0 (H) 07/07/2014 0516   LYMPHSABS 0.3 (L) 01/31/2022 1323   LYMPHSABS 1.5 07/07/2014 0516   MONOABS 0.5 01/31/2022 1323   MONOABS 0.6 07/07/2014 0516   EOSABS 0.0 01/31/2022 1323   EOSABS 0.3 07/07/2014 0516   BASOSABS 0.0 01/31/2022 1323   BASOSABS 0.1 07/07/2014 0516   Comprehensive Metabolic Panel:    Component Value Date/Time   NA 130 (L) 01/31/2022 1323   NA 137 11/02/2014 1859   K 4.0 01/31/2022 1323   K 3.5 11/02/2014 1859   CL 94 (L) 01/31/2022 1323   CL 101 11/02/2014 1859   CO2 25 01/31/2022 1323   CO2 26 11/02/2014 1859   BUN 20 01/31/2022 1323   BUN 11 11/02/2014 1859   CREATININE 1.05 01/31/2022 1323   CREATININE 1.06 11/02/2014 1859   GLUCOSE 79 01/31/2022 1323   GLUCOSE 100 (H) 11/02/2014 1859   CALCIUM 8.2 (L) 01/31/2022 1323   CALCIUM 8.9 11/02/2014 1859   AST 15 01/31/2022 1323   AST 40 11/02/2014 1859   ALT 10 01/31/2022 1323   ALT 23 11/02/2014 1859   ALKPHOS 76 01/31/2022 1323   ALKPHOS 83 11/02/2014 1859    BILITOT 1.2 01/31/2022 1323   BILITOT 1.1 11/02/2014 1859   PROT 6.4 (L) 01/31/2022 1323   PROT 7.8 11/02/2014 1859   ALBUMIN 2.8 (L) 01/31/2022 1323   ALBUMIN 4.0 11/02/2014 1859    RADIOGRAPHIC STUDIES: DG Chest 2 View  Result Date: 01/31/2022 CLINICAL DATA:  Persistent cough EXAM: CHEST - 2 VIEW COMPARISON:  02/04/2020 FINDINGS: Postsurgical changes of right upper lobectomy with interstitial thickening and scarring. Unchanged blunting of the right costophrenic angle, likely scarring. Unchanged cardiac and mediastinal contours. Left chest port with catheter tip in the SVC. The left lung is clear. No acute osseous abnormality. IMPRESSION: Stable chest radiograph. Electronically Signed   By: Merilyn Baba M.D.   On: 01/31/2022 13:16    PERFORMANCE STATUS (ECOG) : 2 - Symptomatic, <50% confined to bed  Review of Systems Unless otherwise noted, a complete review of systems is negative.  Physical Exam General: Frail appearing Cardiovascular: regular rate and rhythm Pulmonary: Coarse anterior/posterior fields Abdomen: soft, nontender, + bowel sounds GU: no suprapubic tenderness Extremities: no edema, no joint deformities Skin: no rashes Neurological: Weakness but otherwise nonfocal  IMPRESSION: Hypoxia- patient noted to be hypoxic in clinic with SPO2 87 to 88% on room air.  He was started on 2 L O2 with improvement in SPO2.  Patient was started on Levaquin and prednisone yesterday.  Clinically slightly worse today given hypoxia.  Again discussed option of transferring to the ED for possible hospitalization.  Patient and wife are now in agreement.  Report called to ED  PLAN: -Transfer to the ED   Case and plan discussed with Dr. Tasia Catchings  Patient expressed understanding and was  in agreement with this plan. He also understands that He can call clinic at any time with any questions, concerns, or complaints.   Thank you for allowing me to participate in the care of this very pleasant patient.    Time Total: 20 minutes  Visit consisted of counseling and education dealing with the complex and emotionally intense issues of symptom management in the setting of serious illness.Greater than 50%  of this time was spent counseling and coordinating care related to the above assessment and plan.  Signed by: Altha Harm, PhD, NP-C

## 2022-02-01 NOTE — Assessment & Plan Note (Addendum)
Platelet 56 --> 30, no active bleeding.   Likely due to chemotherapy  Follow-up with CBC  Cardiology recommends continue Eliquis, will restart this

## 2022-02-01 NOTE — Assessment & Plan Note (Addendum)
Sepsis due to HCAP and COPD exacerbation: pt meets criteria for sepsis with heart rate 120, RR 23.   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  Admited to progressive unit as inpatient  IV Vancomycin and cefepime, added Doxy to cover atypicals given CT findings  Incentive spirometry  Solu-Medrol 40 mg twice daily  Mucinex for cough   Bronchodilators  Urine legionella and S. pneumococcal antigen  Follow up blood culture x2, sputum culture  covid19 PCR: negatve  will get Procalcitonin and trend lactic acid level per sepsis protocol  IVF: 500 ml of NS bolus in ED (patient has diastolic congestive heart failure with elevated BNP 920, limiting aggressive IV fluids treatment)

## 2022-02-01 NOTE — Assessment & Plan Note (Addendum)
Sodium 129, mental status normal.  Likely due to poor oral intake Sodium improved with fluid restriction and 500 mL normal saline  Monitor BMP

## 2022-02-01 NOTE — Telephone Encounter (Signed)
Spoke with patient. He had a negative home covid 19 test.

## 2022-02-01 NOTE — ED Triage Notes (Addendum)
Pt here with SOB. Pt is a lung CA pt. Pt does not wear oxygen at home, pt on 2L [place] currently, pt was 88% on RA at the clinic. Pt denies pain, n/v/d.

## 2022-02-01 NOTE — ED Triage Notes (Signed)
First Nurse Note:  Pt via [place] from [place]. Pt has a hx of lung cancer. Pt was seen at the cancer center yesterday for SOB, cough, congestion, and fever. CXR was done yesterday and it was negative. Pt did not want to be admitted yesterday, pt was seen again today and now hypoxic. Pt is A&Ox4 and NAD on arrival.

## 2022-02-01 NOTE — ED Notes (Signed)
Pt had bloodwork done today at the cancer center.

## 2022-02-01 NOTE — Assessment & Plan Note (Signed)
Hemoglobin 10.7.  Likely due to chemotherapy -Follow-up with CBC

## 2022-02-01 NOTE — Assessment & Plan Note (Signed)
2D echo 01/01/2021 showed EF of 60 to 65% with grade 1 diastolic dysfunction.  Patient has elevated BNP at 920, but no leg edema or JVD.  Chest x-ray negative for infiltration.  Does not seem to have CHF exacerbation  Watch volume status closely

## 2022-02-02 ENCOUNTER — Other Ambulatory Visit: Payer: Self-pay

## 2022-02-02 DIAGNOSIS — J189 Pneumonia, unspecified organism: Secondary | ICD-10-CM | POA: Diagnosis not present

## 2022-02-02 DIAGNOSIS — I471 Supraventricular tachycardia: Secondary | ICD-10-CM

## 2022-02-02 LAB — CBC
HCT: 30.6 % — ABNORMAL LOW (ref 39.0–52.0)
Hemoglobin: 10.4 g/dL — ABNORMAL LOW (ref 13.0–17.0)
MCH: 35.1 pg — ABNORMAL HIGH (ref 26.0–34.0)
MCHC: 34 g/dL (ref 30.0–36.0)
MCV: 103.4 fL — ABNORMAL HIGH (ref 80.0–100.0)
Platelets: 38 10*3/uL — ABNORMAL LOW (ref 150–400)
RBC: 2.96 MIL/uL — ABNORMAL LOW (ref 4.22–5.81)
RDW: 14 % (ref 11.5–15.5)
WBC: 19.4 10*3/uL — ABNORMAL HIGH (ref 4.0–10.5)
nRBC: 0 % (ref 0.0–0.2)

## 2022-02-02 LAB — BASIC METABOLIC PANEL
Anion gap: 13 (ref 5–15)
Anion gap: 11 (ref 5–15)
Anion gap: 11 (ref 5–15)
BUN: 18 mg/dL (ref 6–20)
BUN: 22 mg/dL — ABNORMAL HIGH (ref 6–20)
BUN: 27 mg/dL — ABNORMAL HIGH (ref 6–20)
CO2: 17 mmol/L — ABNORMAL LOW (ref 22–32)
CO2: 19 mmol/L — ABNORMAL LOW (ref 22–32)
CO2: 17 mmol/L — ABNORMAL LOW (ref 22–32)
Calcium: 7.9 mg/dL — ABNORMAL LOW (ref 8.9–10.3)
Calcium: 8.2 mg/dL — ABNORMAL LOW (ref 8.9–10.3)
Calcium: 8.3 mg/dL — ABNORMAL LOW (ref 8.9–10.3)
Chloride: 102 mmol/L (ref 98–111)
Chloride: 103 mmol/L (ref 98–111)
Chloride: 104 mmol/L (ref 98–111)
Creatinine, Ser: 1.06 mg/dL (ref 0.61–1.24)
Creatinine, Ser: 1.13 mg/dL (ref 0.61–1.24)
Creatinine, Ser: 1.17 mg/dL (ref 0.61–1.24)
GFR, Estimated: >60 mL/min (ref >60)
GFR, Estimated: >60 mL/min (ref >60)
GFR, Estimated: >60 mL/min (ref >60)
Glucose, Bld: 91 mg/dL (ref 70–99)
Glucose, Bld: 222 mg/dL — ABNORMAL HIGH (ref 70–99)
Glucose, Bld: 178 mg/dL — ABNORMAL HIGH (ref 70–99)
Potassium: 4.5 mmol/L (ref 3.5–5.1)
Potassium: 4 mmol/L (ref 3.5–5.1)
Potassium: 4.3 mmol/L (ref 3.5–5.1)
Sodium: 132 mmol/L — ABNORMAL LOW (ref 135–145)
Sodium: 133 mmol/L — ABNORMAL LOW (ref 135–145)
Sodium: 132 mmol/L — ABNORMAL LOW (ref 135–145)

## 2022-02-02 LAB — MAGNESIUM: Magnesium: 2.4 mg/dL (ref 1.7–2.4)

## 2022-02-02 LAB — CBG MONITORING, ED
Glucose-Capillary: 105 mg/dL — ABNORMAL HIGH (ref 70–99)
Glucose-Capillary: 103 mg/dL — ABNORMAL HIGH (ref 70–99)
Glucose-Capillary: 100 mg/dL — ABNORMAL HIGH (ref 70–99)
Glucose-Capillary: 183 mg/dL — ABNORMAL HIGH (ref 70–99)

## 2022-02-02 LAB — OSMOLALITY, URINE: Osmolality, Ur: 647 mOsm/kg (ref 300–900)

## 2022-02-02 LAB — GLUCOSE, CAPILLARY: Glucose-Capillary: 193 mg/dL — ABNORMAL HIGH (ref 70–99)

## 2022-02-02 LAB — SODIUM, URINE, RANDOM: Sodium, Ur: 84 mmol/L

## 2022-02-02 LAB — STREP PNEUMONIAE URINARY ANTIGEN: Strep Pneumo Urinary Antigen: NEGATIVE

## 2022-02-02 MED ORDER — ADENOSINE 6 MG/2ML IV SOLN
18.0000 mg | INTRAVENOUS | Status: AC
  Filled 2022-02-02: qty 6

## 2022-02-02 MED ORDER — METOPROLOL TARTRATE 25 MG PO TABS
12.5000 mg | ORAL_TABLET | Freq: Three times a day (TID) | ORAL | Status: DC
  Administered 2022-02-02 – 2022-02-04 (×6): 12.5 mg via ORAL
  Filled 2022-02-02 (×6): qty 1

## 2022-02-02 MED ORDER — METOPROLOL TARTRATE 25 MG PO TABS
12.5000 mg | ORAL_TABLET | Freq: Two times a day (BID) | ORAL | Status: DC
Start: 2022-02-02 — End: 2022-02-02
  Administered 2022-02-02: 12.5 mg via ORAL
  Filled 2022-02-02: qty 1

## 2022-02-02 MED ORDER — ADENOSINE 6 MG/2ML IV SOLN
6.0000 mg | INTRAVENOUS | Status: AC
  Administered 2022-02-02: 6 mg via INTRAVENOUS
  Filled 2022-02-02: qty 2

## 2022-02-02 MED ORDER — MOMETASONE FURO-FORMOTEROL FUM 100-5 MCG/ACT IN AERO
2.0000 | INHALATION_SPRAY | Freq: Two times a day (BID) | RESPIRATORY_TRACT | Status: DC
  Administered 2022-02-04 – 2022-02-05 (×3): 2 via RESPIRATORY_TRACT
  Filled 2022-02-02: qty 8.8

## 2022-02-02 MED ORDER — DOXYCYCLINE HYCLATE 100 MG IV SOLR
100.0000 mg | Freq: Two times a day (BID) | INTRAVENOUS | Status: DC
  Administered 2022-02-02 – 2022-02-05 (×7): 100 mg via INTRAVENOUS
  Filled 2022-02-02 (×7): qty 100

## 2022-02-02 MED ORDER — ADENOSINE 12 MG/4ML IV SOLN
12.0000 mg | INTRAVENOUS | Status: AC
Start: 2022-02-02 — End: 2022-02-02
  Administered 2022-02-02: 12 mg via INTRAVENOUS
  Filled 2022-02-02: qty 4

## 2022-02-02 NOTE — Hospital Course (Addendum)
85M, hx of squamous cell lung cancer on immune and chemotherapy (s/p of surgery), hypertension, COPD, alcohol abuse in remission, former smoker, thrombocytopenia, anemia, C. difficile, PE on Eliquis, dCHF, presents to ED 02/01/22 w/ SOB. Of note: Had visit w/ Onc as well 07/25 at which point he decliend transfer to ED, started on levaquin, antitussives, steroids, (+)neutropenia w/o fever. Had another office visit w/ Onc palliative care 07/26 and as sent to ED d/t SOB.  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 Meeting Sepsis criteria: tachycardia, tachypnea. Had abn WBC count day prior (1.5 though complicated by steroids/chemo effect). CTA negative for acute new PE. Started vanco and cefepime. Procal elevated. Given CHF Hx (no exacerbation at time of admission), less aggressive IV fluid resuscitation. IV steroids. Has 2L of new O2 requirement. Held home BP meds. Fluid restricted + NaCl tabs to treat hyponatremia 07/27: still on 2L [place]. Sodium improving. WBC increased (possible steroid effect) to 19. Tachycardia/tachypnea has resolved as of this morning = not meeting SIRS/Sepsis criteria. Then developed SVT treated w/ adenosine 6 mg then 12 mg, cardiology consulted. (Of note, previous Holter in 2020 showed PSVT he was started on Cardizem CD 120 mg daily and eventually metoprolol XL 25 mg once daily). Cardio recs: paroxysmal SVT likely C/W atrial flutter - started metoprolol tartrate 12.5 mg p.o. 3 times daily as his blood pressure and heart rate allow, goal magnesium >2, potassium >4  07/28: marked elevation in WBC likely reactive, VS have remained stable including BP on new beta blocker regimen. BCx NGx2d, sputum Cx pending.  07/29: WBC increased further, elevated Neut ct, VSS, SpO2 92% on RA since early this morning. Echo pending read as of this morning. MRSA neg, d/c Vanc and if doing well through  tonight should be stable for discharge tomorrow. Confirmed w/ Onc no need to be overly concerned about WBC, likely d/t recent treatment and their office will call to set up earlier follow-up.  07/30: VS remain stable, will finish IV abx this morning then ok for discharge on po antibiotics, prednisone taper, home inhalers, close follow up with oncology. Echo resulted: LVEF 50-55 w/ LV low normal fxn, mild LVH, G1 DD. Elevated pulmonary artery pressure. Recommend follow up with cardiology outpatient.    Consultants:  Cardiology   Procedures: none

## 2022-02-02 NOTE — Consult Note (Signed)
Samuel Choi NOTE       Patient ID: Samuel Choi MRN: [id number] DOB/AGE: 1965-07-27 56 y.o.  Admit date: 02/01/2022 Referring Physician Dr. Emeterio Reeve Primary Physician  Primary Cardiologist Dr. 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  Cardiology is consulted because this patient went into a narrow complex tachycardia on 7/27.  The patient presented to [facility] yesterday from his oncologist office and was hypoxic on room air.  He was started on oral antibiotics without much improvement in his symptoms.  He said he felt like he could not breathe for the past 2 days he has had an increased productive cough without noticing fever or chills.  His appetite has been so-so lately.  CTA chest performed on admission showed bilateral chronic pulmonary embolus without acute PE, increased peripheral opacities in both lower lobes in the lingula compatible with atypical infection.  He is being treated with broad-spectrum antibiotics for HCAP/COPD exacerbation.  This morning the patient went into a narrow complex tachycardia with rates sustained in the 160s to 180s likely consistent with atrial flutter.  He was given adenosine IV 6 mg x 1 with temporary resolution, subsequently adenosine IV 12 mg x 1 and converted to sinus tachycardia with rates in the 110s to low 120s.  At my time evaluation the patient is hemodynamically stable, somewhat short of breath.  But denies having any chest pain, palpitations or noticing his heart race,  dizziness or presyncope.  He thinks his breathing has improved somewhat overnight although he remains with an oxygen requirement.   He is followed by Dr. Ubaldo Glassing on an outpatient basis since 2020 when his oncologist noted the patient was tachycardic.  He wore a 72-hour Holter monitor that showed a prominent sinus rhythm and sinus tachycardia with an average heart rate of 103 bpm.  There were rare PVCs and frequent premature atrial contractions without sinus pauses and several (49) runs of SVT.  He was started on Cardizem CD 120 mg daily and eventually metoprolol XL 25 mg once daily without symptomatic tachycardia.  Recent vitals are notable for blood pressure of 125/77 heart rate in sinus tachycardia in the low 100s to 110s with brief paroxysms to the 120s.  SPO2 94% on supplemental oxygen between 2 to 3 L.  Recent labs are notable for a potassium of 4.5, BUN/creatinine 18/1.06, GFR greater than 60.  Magnesium 2.4 (was 1.1 the morning of admission, aggressively supplemented).  BNP elevated at 920.  Significant leukocytosis to 19.4, stable anemia at 10.4/30.6 with worsening thrombocytopenia, downtrending from 142 on 7/19-78-56-and now 38 today.  Elevated procalcitonin to 45.  Review of systems complete and found to be negative unless listed above     Past Medical History:  Diagnosis Date   Alcohol abuse    usually drinks 2-3 drinks per day   Atherosclerosis 06/2018   Chronic sinusitis    Dehydration 02/07/2019   Emphysema of lung ([place]) 06/2018   patient unaware of this.   Hip fracture ([place] [address]) 05/2018   no surgery   History of kidney stones  05/2018   per xray, bilateral nephrolitiasis   Hypertension    Squamous cell carcinoma of lung, right ([place]) 06/2018    Past Surgical History:  Procedure Laterality Date   BRAIN SURGERY  10/2017   nasal/sinus endoscopy. mass benign   ELECTROMAGNETIC NAVIGATION BROCHOSCOPY Right 06/25/2018   Procedure: ELECTROMAGNETIC NAVIGATION BRONCHOSCOPY;  Surgeon:  [name], MD;  Location: [facility] ORS;  Service: Cardiopulmonary;  Laterality: Right;   ENDOBRONCHIAL ULTRASOUND Right 06/25/2018   Procedure: ENDOBRONCHIAL ULTRASOUND;  Surgeon: [name], MD;  Location: [facility] ORS;  Service: Cardiopulmonary;  Laterality: Right;   ENDOBRONCHIAL ULTRASOUND Right 12/26/2018   Procedure: ENDOBRONCHIAL ULTRASOUND RIGHT;  Surgeon: [name], MD;  Location: [facility] ORS;  Service: Cardiopulmonary;  Laterality: Right;   FLEXIBLE BRONCHOSCOPY N/A 08/20/2018   Procedure: FLEXIBLE BRONCHOSCOPY PREOP;  Surgeon: [name], MD;  Location: [facility] ORS;  Service: Thoracic;  Laterality: N/A;   IR CV LINE INJECTION  04/04/2019   NASAL SINUS SURGERY  10/2017   At [facility], frontal sinusotomy, ethmoidectomy, resection anterior cranial fossa neoplasm, turbinate resection   PERIPHERAL VASCULAR THROMBECTOMY Left 12/31/2020   Procedure: PERIPHERAL VASCULAR THROMBECTOMY;  Surgeon: Algernon Huxley, MD;  Location: [facility];  Service: Cardiovascular;  Laterality: Left;   PORTACATH PLACEMENT Left 01/15/2019   Procedure: INSERTION PORT-A-CATH;  Surgeon: [name], MD;  Location: [facility];  Service: General;  Laterality: Left;   THORACOTOMY Right 08/13/2018   Procedure: PREOP BROCHOSCOPY WITH RIGHT THORACOTOMY AND RUL RESECTION;  Surgeon: [name], MD;  Location: [facility];  Service: General;  Laterality: Right;   THORACOTOMY Right 08/20/2018   Procedure: THORACOTOMY MAJOR RIGHT UPPER LOBE LOBECTOMY;  Surgeon: [name], MD;  Location: [facility];  Service: Thoracic;  Laterality: Right;   TOE SURGERY Left    pin in left toe    (Not in a hospital admission)  Social History   Socioeconomic History   Marital status: Married    Spouse name: lisa   Number of children: Not on file   Years of education: Not on file   Highest education level: Not on file  Occupational History   Occupation: welding    Comment: taking time off to resolve issues  Tobacco Use   Smoking status:  Former    Packs/day: 0.50    Years: 15.00    Total pack years: 7.50    Types: Cigarettes    Quit date: 08/2018    Years since quitting: 3.4   Smokeless tobacco: Never  Vaping Use   Vaping Use: Never used  Substance and Sexual Activity   Alcohol use: Yes    Alcohol/week: 3.0 standard drinks of alcohol    Types: 3 Cans of beer per week    Comment: usually 2 drinks per week, per patient   Drug use: No   Sexual activity: Not on file  Other Topics Concern   Not on file  Social History Narrative   Not on file   Social Determinants of Health   Financial Resource Strain: Not on file  Food Insecurity: Not on file  Transportation Needs: Not on file  Physical Activity: Not on file  Stress: Not on file  Social Connections: Not on file  Intimate Partner Violence: Not on file    Family History  Problem Relation Age of Onset   Breast cancer Mother    Diabetes Mother    Lung cancer Father    Hypertension Father       PHYSICAL EXAM General: Pleasant ill-appearing thin black  male, in no acute distress.  Sitting upright in ED stretcher HEENT:  Normocephalic and atraumatic. Neck:  No JVD.  Lungs: Conversational dyspnea on supplemental oxygen.  Coarse breath sounds to auscultation throughout.   Chest: right-sided port, defibrillator pads present Heart: Tachycardic but regular. Normal S1 and S2 without gallops or murmurs. Abdomen: Non-distended appearing.  Msk: Normal strength and tone for age. Extremities: Warm and well perfused. No clubbing, cyanosis.  No peripheral edema.  Neuro: Alert and oriented X 3.  Hoarse voice. Psych:  Answers questions appropriately.   Labs:   Lab Results  Component Value Date   WBC 19.4 (H) 02/02/2022   HGB 10.4 (L) 02/02/2022   HCT 30.6 (L) 02/02/2022   MCV 103.4 (H) 02/02/2022   PLT 38 (L) 02/02/2022    Recent Labs  Lab 01/31/22 1323 02/01/22 1040 02/02/22 0143  NA 130*   < > 132*  K 4.0   < > 4.5  CL 94*   < > 102  CO2 25   < > 17*   BUN 20   < > 18  CREATININE 1.05   < > 1.06  CALCIUM 8.2*   < > 7.9*  PROT 6.4*  --   --   BILITOT 1.2  --   --   ALKPHOS 76  --   --   ALT 10  --   --   AST 15  --   --   GLUCOSE 79   < > 91   < > = values in this interval not displayed.   Lab Results  Component Value Date   CKTOTAL 126 07/04/2014   CKMB 1.6 07/04/2014   TROPONINI 0.21 (H) 07/04/2014    Lab Results  Component Value Date   CHOL 122 01/25/2022   Lab Results  Component Value Date   HDL 85 01/25/2022   Lab Results  Component Value Date   LDLCALC 25 01/25/2022   Lab Results  Component Value Date   TRIG 61 01/25/2022   Lab Results  Component Value Date   CHOLHDL 1.4 01/25/2022   No results found for: "LDLDIRECT"    Radiology: CT Angio Chest PE W and/or Wo Contrast  Result Date: 02/01/2022 CLINICAL DATA:  Lung cancer.  Shortness of breath and hypoxia. * Tracking Code: BO * EXAM: CT ANGIOGRAPHY CHEST WITH CONTRAST TECHNIQUE: Multidetector CT imaging of the chest was performed using the standard protocol during bolus administration of intravenous contrast. Multiplanar CT image reconstructions and MIPs were obtained to evaluate the vascular anatomy. RADIATION DOSE REDUCTION: This exam was performed according to the departmental dose-optimization program which includes automated exposure control, adjustment of the mA and/or kV according to patient size and/or use of iterative reconstruction technique. CONTRAST:  43m OMNIPAQUE IOHEXOL 350 MG/ML SOLN COMPARISON:  PET-CT 11/21/2021 and chest CT 11/15/2021 FINDINGS: Cardiovascular: Left Port-A-Cath tip: Distal SVC. Possible fibrin sheath distally. Scattered findings of chronic pulmonary embolus. This includes in the left pulmonary artery on image 185 where there are peripheral string like filling defects; the peripheral chronic filling defect along the margin of the right lower lobe pulmonary artery on image 213 series 7; an adjacent web-like filling defect in a  segmental branch of the right lower lobe pulmonary artery on image 212 of series 7; and a web like filling defect at the origin of a left lower lobe segmental branch on image 260 series 7. These all have characteristic appearance of chronic embolus, and I do not discern current findings of acute  pulmonary embolus or substantial worsening of appearance compared to prior exams. Mild prominence the main pulmonary artery at 3.7 cm diameter, potentially reflecting pulmonary arterial hypertension. Coronary, aortic arch, and branch vessel atherosclerotic vascular disease. Mediastinum/Nodes: No pathologic adenopathy. Lungs/Pleura: Right upper lobectomy. Stable radiation therapy related findings in the right perihilar region. Improved airspace opacity around peripheral cavitary process in the right lower lobe, currently the cavity or air cyst measures 1.4 by 1.4 cm on image 86 of series 6 and has wall thickness of about 1-2 mm. Similarly mild wall thickening associated with a second bulla or small cavity more medially on image 87 series 6. Patchy peripheral mainly tree-in-bud reticulonodular opacities in the posterior basal segment right lower lobe, images 95 through 135 series 6, favoring atypical infection. Airway thickening and some airway plugging in the right lower lobe. Mosaic attenuation in both lungs. Underlying emphysema. Mild tree-in-bud reticulonodular opacities increased in the posterior basal segment left lower lobe and in the lingula. Upper Abdomen: Unremarkable Musculoskeletal: Unremarkable Review of the MIP images confirms the above findings. IMPRESSION: 1. Bilateral chronic pulmonary embolus but no acute pulmonary embolus. 2. Probable pulmonary arterial hypertension. 3.  Aortic Atherosclerosis (ICD10-I70.0).  Coronary atherosclerosis. 4. Improved airspace opacity along the peripheral cavitary process in the right lower lobe, currently the thin walled air cyst or cavity measures about 1.4 cm in diameter. 5.  Increased peripheral tree-in-bud reticulonodular opacities in both lower lobes in the lingula, compatible with atypical infection. 6. Airway thickening and airway plugging particularly in the right lower lobe. 7. Mosaic attenuation in the lungs, stable but nonspecific. 8.  Emphysema (ICD10-J43.9). 9. Stable radiation therapy related findings in the right perihilar regions. Right upper lobectomy. Electronically Signed   By: Van Clines M.D.   On: 02/01/2022 17:21   DG Chest 2 View  Result Date: 02/01/2022 CLINICAL DATA:  Shortness of breath EXAM: CHEST - 2 VIEW COMPARISON:  Previous studies including examination of 07-Feb-2022 FINDINGS: Cardiac size is within normal limits. There is decreased volume right lung. Linear densities are seen in right parahilar region and right lower lung field with no significant change suggesting scarring. Blunting of right lateral CP angle may suggest pleural thickening. There are no new infiltrates or signs of pulmonary edema. There is no pneumothorax. Tip of chest port is seen in the course of the superior vena cava. IMPRESSION: There are no new infiltrates or signs of pulmonary edema. Electronically Signed   By: Elmer Picker M.D.   On: 02/01/2022 12:37   DG Chest 2 View  Result Date: 2022-02-07 CLINICAL DATA:  Persistent cough EXAM: CHEST - 2 VIEW COMPARISON:  02/04/2020 FINDINGS: Postsurgical changes of right upper lobectomy with interstitial thickening and scarring. Unchanged blunting of the right costophrenic angle, likely scarring. Unchanged cardiac and mediastinal contours. Left chest port with catheter tip in the SVC. The left lung is clear. No acute osseous abnormality. IMPRESSION: Stable chest radiograph. Electronically Signed   By: Merilyn Baba M.D.   On: 02/07/2022 13:16    ECHO 01/01/2021  1. Left ventricular ejection fraction, by estimation, is 60 to 65%. The  left ventricle has normal function. The left ventricle has no regional  wall motion  abnormalities. Left ventricular diastolic parameters are  consistent with Grade I diastolic  dysfunction (impaired relaxation).   2. Right ventricular systolic function is normal. The right ventricular  size is normal.   3. The mitral valve is normal in structure. Mild mitral valve  regurgitation. No evidence of mitral stenosis.  4. Tricuspid valve regurgitation is moderate to severe.   5. The aortic valve is normal in structure. Aortic valve regurgitation is  not visualized. No aortic stenosis is present.   6. The inferior vena cava is normal in size with greater than 50%  respiratory variability, suggesting right atrial pressure of 3 mmHg.   FINDINGS   Left Ventricle: Left ventricular ejection fraction, by estimation, is 60  to 65%. The left ventricle has normal function. The left ventricle has no  regional wall motion abnormalities. The left ventricular internal cavity  size was normal in size. There is   no left ventricular hypertrophy. Left ventricular diastolic parameters  are consistent with Grade I diastolic dysfunction (impaired relaxation).   Right Ventricle: The right ventricular size is normal. No increase in  right ventricular wall thickness. Right ventricular systolic function is  normal.   Left Atrium: Left atrial size was normal in size.   Right Atrium: Right atrial size was normal in size.   Pericardium: There is no evidence of pericardial effusion.   Mitral Valve: The mitral valve is normal in structure. Mild mitral valve  regurgitation. No evidence of mitral valve stenosis.   Tricuspid Valve: The tricuspid valve is normal in structure. Tricuspid  valve regurgitation is moderate to severe. No evidence of tricuspid  stenosis.   Aortic Valve: The aortic valve is normal in structure. Aortic valve  regurgitation is not visualized. No aortic stenosis is present. Aortic  valve peak gradient measures 7.5 mmHg.   Pulmonic Valve: The pulmonic valve was normal in  structure. Pulmonic valve  regurgitation is not visualized. No evidence of pulmonic stenosis.   Aorta: The aortic root is normal in size and structure.   Venous: The inferior vena cava is normal in size with greater than 50%  respiratory variability, suggesting right atrial pressure of 3 mmHg.   IAS/Shunts: No atrial level shunt detected by color flow Doppler.      LEFT VENTRICLE  PLAX 2D  LVIDd:         4.20 cm  Diastology  LVIDs:         2.90 cm  LV e' medial:    4.90 cm/s  LV PW:         1.10 cm  LV E/e' medial:  13.3  LV IVS:        1.10 cm  LV e' lateral:   6.09 cm/s  LVOT diam:     1.90 cm  LV E/e' lateral: 10.7  LVOT Area:     2.84 cm      RIGHT VENTRICLE  RV Mid diam:    4.10 cm  RV S prime:     11.10 cm/s  TAPSE (M-mode): 1.7 cm   LEFT ATRIUM             Index       RIGHT ATRIUM           Index  LA diam:        3.10 cm 1.76 cm/m  RA Area:     16.90 cm  LA Vol (A2C):   43.7 ml 24.84 ml/m RA Volume:   42.00 ml  23.87 ml/m  LA Vol (A4C):   42.4 ml 24.10 ml/m  LA Biplane Vol: 44.1 ml 25.07 ml/m   AORTIC VALVE                PULMONIC VALVE  AV Area (Vmax): 2.96 cm    PV Vmax:  0.85 m/s  AV Vmax:        137.00 cm/s PV Peak grad:   2.9 mmHg  AV Peak Grad:   7.5 mmHg    RVOT Peak grad: 1 mmHg  LVOT Vmax:      143.00 cm/s     AORTA  Ao Root diam: 3.00 cm   MITRAL VALVE               TRICUSPID VALVE  MV Area (PHT): 3.42 cm    TR Peak grad:   78.9 mmHg  MV Decel Time: 222 msec    TR Vmax:        444.00 cm/s  MV E velocity: 65.10 cm/s  MV A velocity: 77.60 cm/s  SHUNTS  MV E/A ratio:  0.84        Systemic Diam: 1.90 cm  MV A Prime:    10.6 cm/s   Isaias Cowman MD  Electronically signed by Isaias Cowman MD  Signature Date/Time: 01/02/2021/10:02:39 AM   TELEMETRY reviewed by me: During interview sinus tachycardia rate low 100s to 110s with brief paroxysms into the 120s.  EKG reviewed by me and Dr. 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  Cardiology is consulted because this patient went into a narrow complex tachycardia on 7/27.  #HCAP versus COPD exacerbation #Stage IV squamous cell carcinoma of the right lung #paroxysmal SVT likely C/W atrial flutter with conversion to sinus tach s/p adenosine x2 The patient presents with a 2-day history of shortness of breath, cough with sputum production and was hypoxic on admission requiring supplemental oxygen.  CTA chest showing atypical infection with significant leukocytosis to 19 and elevated procalcitonin of 45.  The morning of 7/27 patient went into a narrow complex tachycardia likely C/W atrial flutter and was given IV adenosine x2 with conversion to sinus tachycardia. -Agree with current management of patient's underlying infection per primary team -Recommend continuing to treat causes of increased adrenergic tone including hypoxia, pain, infection -S/p adenosine 6 mg x 1, 12 mg x 1 -Start short acting metoprolol tartrate 12.5 mg p.o. 3 times daily as his blood pressure and heart rate allow (home meds: metoprolol XL 25 mg once daily and Cardizem CD 120 mg once daily) -Monitor and replete electrolytes as needed to maintain magnesium >2, potassium >4 -No further cardiac diagnostics necessary at this time  This patient's plan of care was discussed and created with Dr. Saralyn Pilar and he is in agreement.  Signed: Tristan Schroeder , PA-C 02/02/2022, 10:26 AM [facility]  Cardiology

## 2022-02-02 NOTE — Progress Notes (Addendum)
S: Called to bedside, concern for tachycardia. Pt is totally asymptomatic: denies palpitations, CP/SOB, lightheadedness, anxiety.  O: Gen alert oriented, confirms full code. CV: tachycardic, regular.  BP 109/81   Pulse 94   Temp 98.2 F (36.8 C) (Oral)   Resp 18   Ht 6\' 1"  (1.854 m)   Wt 58.1 kg   SpO2 98%   BMI 16.90 kg/m   A/P: Suspect acute illness precipitated SVT EKG as below - narrow atrial tachycardia --> Adenosine per ACLS protocol. 6 mg ineffective. Few minutes after 12 mg pt converted to sinus tach and now steady at 100-105 bpm, see subsequent EKG in Epic  --> Will consult cardiology for this admission. Secure chat acknowledged by [facility] PA.       Critical care time, 35 min.

## 2022-02-02 NOTE — Progress Notes (Addendum)
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 difficile, PE on Eliquis, dCHF, presents to ED 02/01/22 w/ SOB. Of note: Had visit w/ Onc as well 07/25 at which point he decliend transfer to ED, started on levaquin, antitussives, steroids, (+)neutropenia w/o fever. Had another office visit w/ Onc palliative care 07/26 and as sent to ED d/t SOB.  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 Meeting Sepsis criteria: tachycardia, tachypnea. Had abn WBC count day prior (1.5 though complicated by steroids/chemo effect). CTA negative for acute new PE. Started vanco and cefepime. Procal elevated. Given CHF Hx (no exacerbation at time of admission), less aggressive IV fluid resuscitation. IV steroids. Has 2L of new O2 requirement. Held home BP meds. Fluid restricted + NaCl tabs to treat hyponatremia 07/27: still on 2L [place]. Sodium improving. WBC increased (possible steroid effect) to 19. Tachycardia/tachypnea has resolved as of this morning = not meeting SIRS/Sepsis criteria.   Progression goals: continue IV abx until cultures result, continue tx COPD exacerbation and goal to wean down supplemental O2 to home requirement (RA).    Consultants:  Cardiology  Procedures: none    Subjective: Patient reports feeling normal other than SOB/cough which brought him to ED. No subjective palpitations despite HR in 170's. No chest pain. No lightheadedness.      ASSESSMENT & PLAN:   Principal Problem:   HCAP (healthcare-associated pneumonia) Active Problems:   COPD exacerbation ([place])   Sepsis ([place])    Squamous cell carcinoma of right lung (HCC)   Essential hypertension   Pulmonary embolus (HCC)   Hypokalemia   Hypomagnesemia   Hyponatremia   Severe protein-calorie malnutrition (HCC)   Thrombocytopenia (HCC)   Chronic diastolic CHF (congestive heart failure) (HCC)   Macrocytic anemia   SVT (supraventricular tachycardia) (HCC)   HCAP (healthcare-associated pneumonia) Sepsis due to HCAP and COPD exacerbation: pt meets criteria for sepsis with heart rate 120, RR 23.  Pending lactic acid level.  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 Admited to progressive unit as inpatient IV Vancomycin and cefepime, added Doxy to cover atypicals given CT findings Incentive spirometry Solu-Medrol 40 mg twice daily Mucinex for cough  Bronchodilators Urine legionella and S. pneumococcal antigen Follow up blood culture x2, sputum culture f/u covid19 PCR will get Procalcitonin and trend lactic acid level per sepsis protocol IVF: 500 ml of NS bolus in ED (patient has diastolic congestive heart failure with elevated BNP 920, limiting aggressive IV fluids treatment)  COPD exacerbation (HCC) see above  Sepsis ([place]) see above  Squamous cell carcinoma of right lung ([place]) S/p of surgery, currently on chemo and immunotherapy with ramucirumab.  Last dose was 7/19 Follow-up with Dr. Tasia Catchings of oncology  Essential hypertension Blood pressure is soft due to sepsis Initially held home blood pressure medications  will put on beta blocker given SVT, restart others per cardiology   Pulmonary embolus ([place]) CT 07/26: Bilateral chronic pulmonary embolus but no acute pulmonary embolus Continue Eliquis  Hypokalemia Potassium 3.4 Repleted potassium  Hyponatremia Sodium 129, mental status normal.  Likely due to poor oral intake Will check urine sodium, urine osmolality, serum osmolality. Fluid  restriction IVF:500 mL NS f/u by BMP q8h Sodium chloride tablet 2 g twice daily avoid over  correction too fast due to risk of central pontine myelinolysis   Hypomagnesemia Magnesium 1.1 Give 4 g magnesium sulfate by IV Check phosphorus level  Severe protein-calorie malnutrition (HCC) Body weight 58.1 kg, BMI 16.90 Nutrition consult Ensure  Thrombocytopenia (HCC) Platelet 56, no active bleeding.   Likely due to chemotherapy Follow-up with CBC  Chronic diastolic CHF (congestive heart failure) ([place]) 2D echo 01/01/2021 showed EF of 60 to 65% with grade 1 diastolic dysfunction.  Patient has elevated BNP at 920, but no leg edema or JVD.  Chest x-ray negative for infiltration.  Does not seem to have CHF exacerbation Watch volume status closely  Macrocytic anemia Hemoglobin 10.7.  Likely due to chemotherapy -Follow-up with CBC  SVT (supraventricular tachycardia) (HCC) Likely d/t acute illness, concern for stress d/t chronic PE  Episode 02/02/22 see note Converted after 2nd dose (12 mg) adenosine  Cardiology consulted Outpatient record review:  Last seen outpatient 12/2021 Dr. Ubaldo Glassing d/t tachycardia (referred by oncology), underwent a Holter monitor revealing an average heart rate of just over 100, continued diltiazem CD 120 mg daily.  Echo last year 12/2020 for VTach, LVEF 60-65, normal LV fxn, Grade 1 diastolic dysfunction.  Repeat echo now     DVT prophylaxis: eliquis Code Status: FULL (confirmed at bedside morning of 02/02/22) Family Communication: wife at bedside Disposition Plan / TOC needs: home when stable Barriers to discharge / significant pending items: treating HCAP, awaiting cultures and tapering off O2 / home O2              Objective: Vitals:   02/02/22 0615 02/02/22 1035 02/02/22 1100 02/02/22 1112  BP: 109/81 113/74 125/77   Pulse: 94 (!) 108 (!) 106   Resp: 18 20 18    Temp: 98.2 F (36.8 C)   98.4 F (36.9 C)  TempSrc: Oral     SpO2: 98% 96% 94%   Weight:      Height:        Intake/Output Summary (Last 24 hours) at 02/02/2022  1357 Last data filed at 02/02/2022 1035 Gross per 24 hour  Intake 444.42 ml  Output 1200 ml  Net -755.58 ml   Filed Weights   02/01/22 1202  Weight: 58.1 kg    Examination:  Constitutional:  VS as above General Appearance: alert, thin, NAD Ears, Nose, Mouth, Throat: Normal appearance Neck: No masses, trachea midline Respiratory: Normal respiratory effort Breath sounds normal, no wheeze/rhonchi/rales Cardiovascular: Severe tachycardia initial exam, see separate note Reexam, tachycardia approx 100 bpm S1/S2 normal No carotid bruit or JVD No lower extremity edema Gastrointestinal: Nontender, no masses No hernia appreciated Musculoskeletal:  No clubbing/cyanosis of digits Neurological: No cranial nerve deficit on limited exam Motor and sensation intact and symmetric Psychiatric: Normal judgment/insight Normal mood and affect       Scheduled Medications:   adenosine (ADENOCARD) IV  18 mg Intravenous STAT   apixaban  2.5 mg Oral BID   cyanocobalamin  1,000 mcg Oral Daily   feeding supplement  237 mL Oral BID BM   ipratropium-albuterol  3 mL Nebulization Q4H   latanoprost  1 drop Both Eyes QHS   loratadine  10 mg Oral Daily   methylPREDNISolone (SOLU-MEDROL) injection  40 mg Intravenous Q12H   metoprolol tartrate  12.5 mg Oral TID   [START ON 02/03/2022] mometasone-formoterol  2 puff Inhalation BID   pantoprazole  20 mg Oral Daily  sodium chloride  2 g Oral BID WC    Continuous Infusions:  ceFEPime (MAXIPIME) IV Stopped (02/02/22 0636)   doxycycline (VIBRAMYCIN) IV Stopped (02/02/22 1334)   vancomycin      PRN Medications:  acetaminophen, albuterol, dextromethorphan-guaiFENesin, dextrose, diphenhydrAMINE, HYDROcodone-acetaminophen, methocarbamol  Antimicrobials:  Anti-infectives (From admission, onward)    Start     Dose/Rate Route Frequency Ordered Stop   02/02/22 2100  vancomycin (VANCOREADY) IVPB 1250 mg/250 mL        1,250 mg 166.7 mL/hr over 90  Minutes Intravenous Every 24 hours 02/01/22 1904     02/02/22 1015  doxycycline (VIBRAMYCIN) 100 mg in sodium chloride 0.9 % 250 mL IVPB        100 mg 125 mL/hr over 120 Minutes Intravenous Every 12 hours 02/02/22 1013     02/01/22 2000  ceFEPIme (MAXIPIME) 2 g in sodium chloride 0.9 % 100 mL IVPB        2 g 200 mL/hr over 30 Minutes Intravenous Every 8 hours 02/01/22 1845     02/01/22 1930  vancomycin (VANCOREADY) IVPB 1250 mg/250 mL        1,250 mg 166.7 mL/hr over 90 Minutes Intravenous  Once 02/01/22 1858 02/01/22 2115   02/01/22 1745  azithromycin (ZITHROMAX) 500 mg in sodium chloride 0.9 % 250 mL IVPB  Status:  Discontinued        500 mg 250 mL/hr over 60 Minutes Intravenous  Once 02/01/22 1738 02/01/22 1837   02/01/22 1745  cefTRIAXone (ROCEPHIN) 1 g in sodium chloride 0.9 % 100 mL IVPB  Status:  Discontinued        1 g 200 mL/hr over 30 Minutes Intravenous  Once 02/01/22 1738 02/01/22 1837       Data Reviewed: I have personally reviewed following labs and imaging studies  CBC: Recent Labs  Lab 01/31/22 1323 02/01/22 1040 02/02/22 0143  WBC 1.5* 7.6 19.4*  NEUTROABS 0.6* 6.0  --   HGB 11.8* 10.7* 10.4*  HCT 33.2* 30.3* 30.6*  MCV 101.8* 100.7* 103.4*  PLT 78* 56* 38*   Basic Metabolic Panel: Recent Labs  Lab 01/31/22 1323 02/01/22 1040 02/01/22 1815 02/02/22 0143 02/02/22 1250  NA 130* 129* 131* 132* 133*  K 4.0 3.4* 3.8 4.5 4.0  CL 94* 97* 99 102 103  CO2 25 21* 17* 17* 19*  GLUCOSE 79 62* 42* 91 222*  BUN 20 21* 19 18 22*  CREATININE 1.05 1.06 1.02 1.06 1.13  CALCIUM 8.2* 7.7* 7.8* 7.9* 8.2*  MG  --  1.1*  --  2.4  --   PHOS  --   --  2.3*  --   --    GFR: Estimated Creatinine Clearance: 60 mL/min (by C-G formula based on SCr of 1.13 mg/dL). Liver Function Tests: Recent Labs  Lab 01/31/22 1323  AST 15  ALT 10  ALKPHOS 76  BILITOT 1.2  PROT 6.4*  ALBUMIN 2.8*   No results for input(s): "LIPASE", "AMYLASE" in the last 168 hours. No results  for input(s): "AMMONIA" in the last 168 hours. Coagulation Profile: No results for input(s): "INR", "PROTIME" in the last 168 hours. Cardiac Enzymes: No results for input(s): "CKTOTAL", "CKMB", "CKMBINDEX", "TROPONINI" in the last 168 hours. BNP (last 3 results) No results for input(s): "PROBNP" in the last 8760 hours. HbA1C: No results for input(s): "HGBA1C" in the last 72 hours. CBG: Recent Labs  Lab 02/01/22 1951 02/02/22 0143 02/02/22 0522 02/02/22 0754 02/02/22 1153  GLUCAP 103* 105* 103* 100*  183*   Lipid Profile: No results for input(s): "CHOL", "HDL", "LDLCALC", "TRIG", "CHOLHDL", "LDLDIRECT" in the last 72 hours. Thyroid Function Tests: No results for input(s): "TSH", "T4TOTAL", "FREET4", "T3FREE", "THYROIDAB" in the last 72 hours. Anemia Panel: No results for input(s): "VITAMINB12", "FOLATE", "FERRITIN", "TIBC", "IRON", "RETICCTPCT" in the last 72 hours. Urine analysis:    Component Value Date/Time   COLORURINE Yellow 11/02/2014 1900   APPEARANCEUR Hazy 11/02/2014 1900   LABSPEC 1.013 11/02/2014 1900   PHURINE 9.0 11/02/2014 1900   GLUCOSEU Negative 11/02/2014 1900   HGBUR 1+ 11/02/2014 1900   BILIRUBINUR Negative 11/02/2014 1900   KETONESUR Negative 11/02/2014 1900   PROTEINUR 30 mg/dL 11/02/2014 1900   NITRITE Negative 11/02/2014 1900   LEUKOCYTESUR 3+ 11/02/2014 1900   Sepsis Labs: @LABRCNTIP (procalcitonin:4,lacticidven:4)  Recent Results (from the past 240 hour(s))  Blood culture (routine x 2)     Status: None (Preliminary result)   Collection Time: 02/01/22  5:39 PM   Specimen: BLOOD RIGHT FOREARM  Result Value Ref Range Status   Specimen Description BLOOD RIGHT FOREARM  Final   Special Requests   Final    BOTTLES DRAWN AEROBIC AND ANAEROBIC Blood Culture results may not be optimal due to an inadequate volume of blood received in culture bottles   Culture   Final    NO GROWTH < 12 HOURS Performed at [facility], [address]  [place], [place] [address]    Report Status PENDING  Incomplete  Blood culture (routine x 2)     Status: None (Preliminary result)   Collection Time: 02/01/22  5:44 PM   Specimen: BLOOD RIGHT HAND  Result Value Ref Range Status   Specimen Description BLOOD RIGHT HAND  Final   Special Requests   Final    BOTTLES DRAWN AEROBIC AND ANAEROBIC Blood Culture adequate volume   Culture   Final    NO GROWTH < 12 HOURS Performed at [facility], [address], [place], [place] [address]    Report Status PENDING  Incomplete  SARS Coronavirus 2 by RT PCR (hospital order, performed in [place] hospital lab) *cepheid single result test* Anterior Nasal Swab     Status: None   Collection Time: 02/01/22  6:50 PM   Specimen: Anterior Nasal Swab  Result Value Ref Range Status   SARS Coronavirus 2 by RT PCR NEGATIVE NEGATIVE Final    Comment: (NOTE) SARS-CoV-2 target nucleic acids are NOT DETECTED.  The SARS-CoV-2 RNA is generally detectable in upper and lower respiratory specimens during the acute phase of infection. The lowest concentration of SARS-CoV-2 viral copies this assay can detect is 250 copies / mL. A negative result does not preclude SARS-CoV-2 infection and should not be used as the sole basis for treatment or other patient management decisions.  A negative result may occur with improper specimen collection / handling, submission of specimen other than nasopharyngeal swab, presence of viral mutation(s) within the areas targeted by this assay, and inadequate number of viral copies (<250 copies / mL). A negative result must be combined with clinical observations, patient history, and epidemiological information.  Fact Sheet for Patients:   https://www.patel.info/  Fact Sheet for Healthcare Providers: https://hall.com/  This test is not yet approved or  cleared by the [place] FDA and has been authorized for detection and/or  diagnosis of SARS-CoV-2 by FDA under an Emergency Use Authorization (EUA).  This EUA will remain in effect (meaning this test can be used) for the duration of the  COVID-19 declaration under Section 564(b)(1) of the Act, 21 U.S.C. section 360bbb-3(b)(1), unless the authorization is terminated or revoked sooner.  Performed at [facility], [address], [place], [place] [address]          Radiology Studies last 96 hours: CT Angio Chest PE W and/or Wo Contrast  Result Date: 02/01/2022 CLINICAL DATA:  Lung cancer.  Shortness of breath and hypoxia. * Tracking Code: BO * EXAM: CT ANGIOGRAPHY CHEST WITH CONTRAST TECHNIQUE: Multidetector CT imaging of the chest was performed using the standard protocol during bolus administration of intravenous contrast. Multiplanar CT image reconstructions and MIPs were obtained to evaluate the vascular anatomy. RADIATION DOSE REDUCTION: This exam was performed according to the departmental dose-optimization program which includes automated exposure control, adjustment of the mA and/or kV according to patient size and/or use of iterative reconstruction technique. CONTRAST:  65mL OMNIPAQUE IOHEXOL 350 MG/ML SOLN COMPARISON:  PET-CT 11/21/2021 and chest CT 11/15/2021 FINDINGS: Cardiovascular: Left Port-A-Cath tip: Distal SVC. Possible fibrin sheath distally. Scattered findings of chronic pulmonary embolus. This includes in the left pulmonary artery on image 185 where there are peripheral string like filling defects; the peripheral chronic filling defect along the margin of the right lower lobe pulmonary artery on image 213 series 7; an adjacent web-like filling defect in a segmental branch of the right lower lobe pulmonary artery on image 212 of series 7; and a web like filling defect at the origin of a left lower lobe segmental branch on image 260 series 7. These all have characteristic appearance of chronic embolus, and I do not discern current findings of  acute pulmonary embolus or substantial worsening of appearance compared to prior exams. Mild prominence the main pulmonary artery at 3.7 cm diameter, potentially reflecting pulmonary arterial hypertension. Coronary, aortic arch, and branch vessel atherosclerotic vascular disease. Mediastinum/Nodes: No pathologic adenopathy. Lungs/Pleura: Right upper lobectomy. Stable radiation therapy related findings in the right perihilar region. Improved airspace opacity around peripheral cavitary process in the right lower lobe, currently the cavity or air cyst measures 1.4 by 1.4 cm on image 86 of series 6 and has wall thickness of about 1-2 mm. Similarly mild wall thickening associated with a second bulla or small cavity more medially on image 87 series 6. Patchy peripheral mainly tree-in-bud reticulonodular opacities in the posterior basal segment right lower lobe, images 95 through 135 series 6, favoring atypical infection. Airway thickening and some airway plugging in the right lower lobe. Mosaic attenuation in both lungs. Underlying emphysema. Mild tree-in-bud reticulonodular opacities increased in the posterior basal segment left lower lobe and in the lingula. Upper Abdomen: Unremarkable Musculoskeletal: Unremarkable Review of the MIP images confirms the above findings. IMPRESSION: 1. Bilateral chronic pulmonary embolus but no acute pulmonary embolus. 2. Probable pulmonary arterial hypertension. 3.  Aortic Atherosclerosis (ICD10-I70.0).  Coronary atherosclerosis. 4. Improved airspace opacity along the peripheral cavitary process in the right lower lobe, currently the thin walled air cyst or cavity measures about 1.4 cm in diameter. 5. Increased peripheral tree-in-bud reticulonodular opacities in both lower lobes in the lingula, compatible with atypical infection. 6. Airway thickening and airway plugging particularly in the right lower lobe. 7. Mosaic attenuation in the lungs, stable but nonspecific. 8.  Emphysema  (ICD10-J43.9). 9. Stable radiation therapy related findings in the right perihilar regions. Right upper lobectomy. Electronically Signed   By: Van Clines M.D.   On: 02/01/2022 17:21   DG Chest 2 View  Result Date: 02/01/2022 CLINICAL DATA:  Shortness of breath EXAM:  CHEST - 2 VIEW COMPARISON:  Previous studies including examination of 2022/02/19 FINDINGS: Cardiac size is within normal limits. There is decreased volume right lung. Linear densities are seen in right parahilar region and right lower lung field with no significant change suggesting scarring. Blunting of right lateral CP angle may suggest pleural thickening. There are no new infiltrates or signs of pulmonary edema. There is no pneumothorax. Tip of chest port is seen in the course of the superior vena cava. IMPRESSION: There are no new infiltrates or signs of pulmonary edema. Electronically Signed   By: Elmer Picker M.D.   On: 02/01/2022 12:37   DG Chest 2 View  Result Date: February 19, 2022 CLINICAL DATA:  Persistent cough EXAM: CHEST - 2 VIEW COMPARISON:  02/04/2020 FINDINGS: Postsurgical changes of right upper lobectomy with interstitial thickening and scarring. Unchanged blunting of the right costophrenic angle, likely scarring. Unchanged cardiac and mediastinal contours. Left chest port with catheter tip in the SVC. The left lung is clear. No acute osseous abnormality. IMPRESSION: Stable chest radiograph. Electronically Signed   By: Merilyn Baba M.D.   On: 2022-02-19 13:16            LOS: 1 day    Time spent: 50 min     Emeterio Reeve, DO [facility] 02/02/2022, 1:57 PM   Staff may message me via secure chat in [place]  but this may not receive immediate response,  please page for urgent matters!  If 7PM-7AM, please contact night-coverage [email or web address]  Dictation software was used to generate the above note. Typos may occur and escape review, as with typed/written notes. Please contact Dr  Sheppard Coil directly for clarity if needed.

## 2022-02-02 NOTE — Progress Notes (Signed)
Admission profile updated. ?

## 2022-02-02 NOTE — Assessment & Plan Note (Addendum)
Likely d/t acute illness, concern for stress d/t chronic PE  Episode 02/02/22 see note  Converted after 2nd dose (12 mg) adenosine   Outpatient record review:   Last seen outpatient 12/2021 Dr. Ubaldo Glassing d/t tachycardia (referred by oncology), underwent a Holter monitor revealing an average heart rate of just over 100, continued diltiazem CD 120 mg daily.   Echo last year 12/2020 for VTach, LVEF 60-65, normal LV fxn, Grade 1 diastolic dysfunction.   Repeat echo pending  Per cardiology recommendations  continue metoprolol tartrate 12.5 mg p.o. 3 times daily  once blood pressure allows would restart home metoprolol XL 25 mg daily and Cardizem CD 120 mg daily  Maintain magnesium greater than 2, potassium greater than 4  No further cardiac diagnostics at this time, signing off as of 02/03/2022

## 2022-02-03 ENCOUNTER — Inpatient Hospital Stay: Payer: 59

## 2022-02-03 ENCOUNTER — Inpatient Hospital Stay
Admit: 2022-02-03 | Discharge: 2022-02-03 | Disposition: A | Discharge status: Home / Self Care | Payer: 59 | Attending: Cardiology | Admitting: Cardiology

## 2022-02-03 DIAGNOSIS — J189 Pneumonia, unspecified organism: Secondary | ICD-10-CM | POA: Diagnosis not present

## 2022-02-03 LAB — BASIC METABOLIC PANEL
Anion gap: 7 (ref 5–15)
BUN: 26 mg/dL — ABNORMAL HIGH (ref 6–20)
CO2: 20 mmol/L — ABNORMAL LOW (ref 22–32)
Calcium: 8.2 mg/dL — ABNORMAL LOW (ref 8.9–10.3)
Chloride: 106 mmol/L (ref 98–111)
Creatinine, Ser: 1.11 mg/dL (ref 0.61–1.24)
GFR, Estimated: >60 mL/min (ref >60)
Glucose, Bld: 153 mg/dL — ABNORMAL HIGH (ref 70–99)
Potassium: 3.9 mmol/L (ref 3.5–5.1)
Sodium: 133 mmol/L — ABNORMAL LOW (ref 135–145)

## 2022-02-03 LAB — LEGIONELLA PNEUMOPHILA SEROGP 1 UR AG: L. pneumophila Serogp 1 Ur Ag: NEGATIVE

## 2022-02-03 LAB — GLUCOSE, CAPILLARY
Glucose-Capillary: 159 mg/dL — ABNORMAL HIGH (ref 70–99)
Glucose-Capillary: 157 mg/dL — ABNORMAL HIGH (ref 70–99)
Glucose-Capillary: 180 mg/dL — ABNORMAL HIGH (ref 70–99)
Glucose-Capillary: 157 mg/dL — ABNORMAL HIGH (ref 70–99)

## 2022-02-03 LAB — CBC
HCT: 24.5 % — ABNORMAL LOW (ref 39.0–52.0)
Hemoglobin: 8.8 g/dL — ABNORMAL LOW (ref 13.0–17.0)
MCH: 35.2 pg — ABNORMAL HIGH (ref 26.0–34.0)
MCHC: 35.9 g/dL (ref 30.0–36.0)
MCV: 98 fL (ref 80.0–100.0)
Platelets: 30 10*3/uL — ABNORMAL LOW (ref 150–400)
RBC: 2.5 MIL/uL — ABNORMAL LOW (ref 4.22–5.81)
RDW: 14 % (ref 11.5–15.5)
WBC: 48.9 10*3/uL — ABNORMAL HIGH (ref 4.0–10.5)
nRBC: 0.1 % (ref 0.0–0.2)

## 2022-02-03 LAB — PROCALCITONIN: Procalcitonin: 20.13 ng/mL

## 2022-02-03 LAB — MAGNESIUM: Magnesium: 2 mg/dL (ref 1.7–2.4)

## 2022-02-03 LAB — MRSA NEXT GEN BY PCR, NASAL: MRSA by PCR Next Gen: NOT DETECTED

## 2022-02-03 MED ORDER — CHLORHEXIDINE GLUCONATE CLOTH 2 % EX PADS
6.0000 | MEDICATED_PAD | Freq: Every day | CUTANEOUS | Status: DC
  Administered 2022-02-04 – 2022-02-05 (×2): 6 via TOPICAL

## 2022-02-03 MED ORDER — IPRATROPIUM-ALBUTEROL 0.5-2.5 (3) MG/3ML IN SOLN
3.0000 mL | Freq: Four times a day (QID) | RESPIRATORY_TRACT | Status: DC
  Administered 2022-02-03 – 2022-02-04 (×3): 3 mL via RESPIRATORY_TRACT
  Filled 2022-02-03 (×3): qty 3

## 2022-02-03 MED ORDER — APIXABAN 2.5 MG PO TABS
2.5000 mg | ORAL_TABLET | Freq: Two times a day (BID) | ORAL | Status: DC
  Administered 2022-02-03 – 2022-02-05 (×5): 2.5 mg via ORAL
  Filled 2022-02-03 (×5): qty 1

## 2022-02-03 MED ORDER — ORAL CARE MOUTH RINSE
15.0000 mL | OROMUCOSAL | Status: DC | PRN
Start: 2022-02-03 — End: 2022-02-05

## 2022-02-03 MED ORDER — ADULT MULTIVITAMIN W/MINERALS CH
1.0000 | ORAL_TABLET | Freq: Every day | ORAL | Status: DC
Start: 2022-02-03 — End: 2022-02-05
  Administered 2022-02-03 – 2022-02-05 (×3): 1 via ORAL
  Filled 2022-02-03 (×3): qty 1

## 2022-02-03 MED ORDER — PERFLUTREN LIPID MICROSPHERE: 1.0000 mL | INTRAVENOUS | Status: AC | PRN

## 2022-02-03 MED ORDER — ENSURE ENLIVE PO LIQD
237.0000 mL | Freq: Three times a day (TID) | ORAL | Status: DC
  Administered 2022-02-03 – 2022-02-05 (×5): 237 mL via ORAL

## 2022-02-03 MED ORDER — POTASSIUM CHLORIDE CRYS ER 20 MEQ PO TBCR
20.0000 meq | EXTENDED_RELEASE_TABLET | Freq: Once | ORAL | Status: AC
  Administered 2022-02-03: 20 meq via ORAL
  Filled 2022-02-03: qty 1

## 2022-02-03 MED ORDER — SODIUM CHLORIDE 0.9 % IV SOLN: INTRAVENOUS | Status: DC | PRN

## 2022-02-03 NOTE — Progress Notes (Signed)
Initial Nutrition Assessment  DOCUMENTATION CODES:   Underweight, Non-severe (moderate) malnutrition in context of chronic illness  INTERVENTION:   -Ensure Enlive po TID, each supplement provides 350 kcal and 20 grams of protein -MVI with minerals daily -Liberalize diet to regular for widest variety of meal selections  NUTRITION DIAGNOSIS:   Moderate Malnutrition related to chronic illness (COPD) as evidenced by moderate fat depletion, moderate muscle depletion, severe muscle depletion.  GOAL:   Patient will meet greater than or equal to 90% of their needs  MONITOR:   PO intake, Supplement acceptance  REASON FOR ASSESSMENT:   Consult Assessment of nutrition requirement/status  ASSESSMENT:   Pt with of squamous cell lung cancer on immune and chemotherapy (s/p of surgery), hypertension, COPD, alcohol abuse in remission, former smoker, thrombocytopenia, anemia, C. difficile, PE on Eliquis, dCHF, presents with shortness of breath.  Pt admitted with SOB, sepsis, and COPD exacerbation.  Reviewed I/O's: +340 ml x 24 hours and +185 ml since admission  UOP: 600 ml x 24 hours   Spoke with pt at bedside, who reports feeling better at time of visit. He shares that he has a fair appetite. Observed meal tray- pt consumed about 1/2 of eggs and 25% of toast. He drank 75% of Ensure supplement, which he likes. Per pt, he consumes 3 meals per day (Breakfast: sandwich; Lunch: meat, starch, and vegetable, Dinner: cheese and crackers).    Pt shares that his UBW is around 130#. He has not lost weight recently, but notes a recent history of weight loss. Reviewed wt hx; pt has experienced a 1.9% wt loss over the past month, which is not significant for time frame.   Discussed importance of good meal and supplement intake to promote healing. Pt shares that he likes Ensure and will use occasionally at home. He is amenable to continue here and at home.   Medications reviewed and include vitamin  B-12, solu-medrol, and 0.9% sodium chloride @ 10 ml/hr.   Labs reviewed: Na: 133, Phos: 2.3, CBGS: 157-159 (inpatient orders for glycemic control are none).    NUTRITION - FOCUSED PHYSICAL EXAM:  Flowsheet Row Most Recent Value  Orbital Region Moderate depletion  Upper Arm Region Moderate depletion  Thoracic and Lumbar Region Mild depletion  Buccal Region Moderate depletion  Temple Region Moderate depletion  Clavicle Bone Region Moderate depletion  Clavicle and Acromion Bone Region Moderate depletion  Scapular Bone Region Moderate depletion  Dorsal Hand Severe depletion  Patellar Region Severe depletion  Anterior Thigh Region Severe depletion  Posterior Calf Region Severe depletion  Edema (RD Assessment) None  Hair Reviewed  Eyes Reviewed  Mouth Reviewed  Skin Reviewed  Nails Reviewed       Diet Order:   Diet Order             Diet regular Room service appropriate? Yes; Fluid consistency: Thin; Fluid restriction: 1200 mL Fluid  Diet effective now                   EDUCATION NEEDS:   Education needs have been addressed  Skin:  Skin Assessment: Reviewed RN Assessment  Last BM:  02/02/22  Height:   Ht Readings from Last 1 Encounters:  02/01/22 6\' 1"  (1.854 m)    Weight:   Wt Readings from Last 1 Encounters:  02/01/22 58.1 kg    Ideal Body Weight:  80.9 kg  BMI:  Body mass index is 16.9 kg/m.  Estimated Nutritional Needs:   Kcal:  2100-2300  Protein:  115-130 grams  Fluid:  > 2 L    Loistine Chance, RD, LDN, [place] Registered Dietitian II Certified Diabetes Care and Education Specialist Please refer to [facility] for RD and/or RD on-call/weekend/after hours pager

## 2022-02-03 NOTE — Progress Notes (Signed)
[place] NOTE       Patient ID: Samuel Choi MRN: [id number] DOB/AGE: 11/25/1965 56 y.o.  Admit date: 02/01/2022 Referring Physician Dr. Emeterio Reeve Primary Physician  Primary Cardiologist Dr. 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  Cardiology is consulted because this patient went into a narrow complex tachycardia on 7/27.  Interval History: - no further episodes of tachycardia noted on tele - feels as though his breathing has improved some, although remains on supplemental O2  - worsening leukocytosis ?steroid related. Afebrile.   Review of systems complete and found to be negative unless listed above     Past Medical History:  Diagnosis Date   Alcohol abuse    usually drinks 2-3 drinks per day   Atherosclerosis 06/2018   Chronic sinusitis    Dehydration 02/07/2019   Emphysema of lung ([place]) 06/2018   patient unaware of this.   Hip fracture ([place]) 05/2018   no surgery   History of kidney stones 05/2018   per xray, bilateral nephrolitiasis   Hypertension    Squamous cell carcinoma of lung, right ([place]) 06/2018    Past Surgical History:  Procedure Laterality Date   BRAIN SURGERY  10/2017   nasal/sinus endoscopy. mass benign   ELECTROMAGNETIC NAVIGATION BROCHOSCOPY Right 06/25/2018   Procedure: ELECTROMAGNETIC NAVIGATION BRONCHOSCOPY;  Surgeon: [name], MD;  Location: [facility] ORS;  Service: Cardiopulmonary;  Laterality: Right;   ENDOBRONCHIAL ULTRASOUND Right 06/25/2018   Procedure:  ENDOBRONCHIAL ULTRASOUND;  Surgeon: [name], MD;  Location: [facility] OR[facility];  Service: Cardiopulmonary;  Laterality: Right;   ENDOBRONCHIAL ULTRASOUND Right 12/26/2018   Procedure: ENDOBRONCHIAL ULTRASOUND RIGHT;  Surgeon: [name], MD;  Location: [facility] OR[facility];  Service: Cardiopulmonary;  Laterality: Right;   FLEXIBLE BRONCHOSCOPY N/A 08/20/2018   Procedure: FLEXIBLE BRONCHOSCOPY PREOP;  Surgeon: [name], MD;  Location: [facility] OR[facility];  Service: Thoracic;  Laterality: N/A;   IR CV LINE INJECTION  04/04/2019   NASAL SINUS SURGERY  10/2017   At [facility], frontal sinusotomy, ethmoidectomy, resection anterior cranial fossa neoplasm, turbinate resection   PERIPHERAL VASCULAR THROMBECTOMY Left 12/31/2020   Procedure: PERIPHERAL VASCULAR THROMBECTOMY;  Surgeon: Algernon Huxley, MD;  Location: [facility];  Service: Cardiovascular;  Laterality: Left;   PORTACATH PLACEMENT Left 01/15/2019   Procedure: INSERTION PORT-A-CATH;  Surgeon: [name], MD;  Location: [facility];  Service: General;  Laterality: Left;   THORACOTOMY Right 08/13/2018   Procedure: PREOP BROCHOSCOPY WITH RIGHT THORACOTOMY AND RUL RESECTION;  Surgeon: [name], MD;  Location: [facility];  Service: General;  Laterality: Right;   THORACOTOMY Right 08/20/2018   Procedure: THORACOTOMY MAJOR RIGHT UPPER LOBE LOBECTOMY;  Surgeon: [name], MD;  Location: [facility];  Service: Thoracic;  Laterality: Right;   TOE SURGERY Left    pin in left toe    Medications Prior to Admission  Medication Sig Dispense Refill Last Dose   albuterol (VENTOLIN HFA) 108 (90 Base) MCG/ACT inhaler INHALE 2 PUFFS BY MOUTH EVERY 6 HOURS AS NEEDED FOR WHEEZE OR SHORTNESS OF BREATH 8.5 g 5 02/01/2022  at prn   apixaban (ELIQUIS) 2.5 MG TABS tablet Take 1 tablet (2.5 mg total) by mouth 2 (two) times daily. 60 tablet 6 02/01/2022 at 0900   benzonatate (TESSALON) 100 MG capsule Take 1 capsule (100 mg total) by mouth 3 (three) times daily as needed for cough. 20 capsule 0  02/01/2022   budesonide-formoterol (SYMBICORT) 80-4.5 MCG/ACT inhaler Inhale 2 puffs into the lungs 2 (two) times daily. in the morning and at bedtime. 10.2 each 12 02/01/2022   diltiazem (CARDIZEM CD) 120 MG 24 hr capsule Take 120 mg by mouth daily.   02/01/2022   latanoprost (XALATAN) 0.005 % ophthalmic solution Place 1 drop into both eyes at bedtime.   02/01/2022   loratadine (CLARITIN) 10 MG tablet Take 10 mg by mouth daily.   02/01/2022   methocarbamol (ROBAXIN) 500 MG tablet Take 1 tablet (500 mg total) by mouth at bedtime. 30 tablet 0 02/01/2022   metoprolol succinate (TOPROL-XL) 25 MG 24 hr tablet Take 1 tablet (25 mg total) by mouth daily. 30 tablet 0 02/01/2022   nystatin (MYCOSTATIN) 100000 UNIT/ML suspension Take 5 mLs (500,000 Units total) by mouth 4 (four) times daily. Swish and swallow. 473 mL 1 02/01/2022   ondansetron (ZOFRAN) 8 MG tablet Take 1 tablet (8 mg total) by mouth every 8 (eight) hours as needed for refractory nausea / vomiting. Start on day 3 after chemo. 90 tablet 1 02/01/2022   pantoprazole (PROTONIX) 20 MG tablet Take 1 tablet (20 mg total) by mouth daily. 60 tablet 2 02/01/2022   potassium chloride (KLOR-CON) 10 MEQ tablet Take 10 mEq by mouth daily. Start taking 01/14/22   02/01/2022   predniSONE (DELTASONE) 20 MG tablet Take 1 tablet (20 mg total) by mouth daily with breakfast. 7 tablet 0 02/01/2022   SPIRIVA HANDIHALER 18 MCG inhalation capsule INHALE 1 CAPSULE VIA HANDIHALER ONCE DAILY AT THE SAME TIME EVERY DAY 30 capsule 0 02/01/2022   vitamin B-12 (CYANOCOBALAMIN) 1000 MCG tablet Take 1 tablet (1,000 mcg total) by mouth daily. 90 tablet 1 02/01/2022   HYDROcodone-acetaminophen (NORCO/VICODIN) 5-325 MG tablet Take 1 tablet by mouth every 8 (eight) hours as needed for severe pain. 30 tablet 0  at prn   levofloxacin (LEVAQUIN) 750 MG tablet Take 1 tablet (750 mg total) by mouth daily. 10 tablet 0    loperamide (IMODIUM) 2 MG capsule Take 1 capsule (2 mg total) by mouth See admin  instructions. Initial: 4 mg, followed by 2 mg after each loose stool; maximum: 16 mg/day (Patient not taking: Reported on 12/16/2021) 60 capsule 0    olmesartan (BENICAR) 40 MG tablet Take 40 mg by mouth every other day. (Patient not taking: Reported on 02/01/2022)   Not Taking   promethazine (PHENERGAN) 25 MG tablet Take 1 tablet (25 mg total) by mouth every 6 (six) hours as needed for nausea or vomiting. (Patient not taking: Reported on 02/01/2022) 30 tablet 0 Not Taking    Social History   Socioeconomic History   Marital status: Married    Spouse name: lisa   Number of children: Not on file   Years of education: Not on file   Highest education level: Not on file  Occupational History   Occupation: welding    Comment: taking time off to resolve issues  Tobacco Use   Smoking status: Former    Packs/day: 0.50    Years: 15.00    Total pack years: 7.50    Types: Cigarettes    Quit date: 08/2018  Years since quitting: 3.4   Smokeless tobacco: Never  Vaping Use   Vaping Use: Never used  Substance and Sexual Activity   Alcohol use: Yes    Alcohol/week: 3.0 standard drinks of alcohol    Types: 3 Cans of beer per week    Comment: usually 2 drinks per week, per patient   Drug use: No   Sexual activity: Not on file  Other Topics Concern   Not on file  Social History Narrative   Not on file   Social Determinants of Health   Financial Resource Strain: Not on file  Food Insecurity: Not on file  Transportation Needs: Not on file  Physical Activity: Not on file  Stress: Not on file  Social Connections: Not on file  Intimate Partner Violence: Not on file    Family History  Problem Relation Age of Onset   Breast cancer Mother    Diabetes Mother    Lung cancer Father    Hypertension Father       PHYSICAL EXAM General: Pleasant ill-appearing thin black male, in no acute distress.  Sitting upright in PCU bed. HEENT:  Normocephalic and atraumatic. Neck:  No JVD.  Lungs:  Conversational dyspnea on supplemental oxygen.  Coarse breath sounds to auscultation throughout.   Chest: right-sided port, defibrillator pads present Heart: Tachycardic but regular. Normal S1 and S2 without gallops or murmurs. Abdomen: Non-distended appearing.  Msk: Normal strength and tone for age. Extremities: Warm and well perfused. No clubbing, cyanosis.  No peripheral edema.  Neuro: Alert and oriented X 3.  Hoarse voice. Psych:  Answers questions appropriately.   Labs:   Lab Results  Component Value Date   WBC 48.9 (H) 02/03/2022   HGB 8.8 (L) 02/03/2022   HCT 24.5 (L) 02/03/2022   MCV 98.0 02/03/2022   PLT 30 (L) 02/03/2022    Recent Labs  Lab 01/31/22 1323 02/01/22 1040 02/03/22 0527  NA 130*   < > 133*  K 4.0   < > 3.9  CL 94*   < > 106  CO2 25   < > 20*  BUN 20   < > 26*  CREATININE 1.05   < > 1.11  CALCIUM 8.2*   < > 8.2*  PROT 6.4*  --   --   BILITOT 1.2  --   --   ALKPHOS 76  --   --   ALT 10  --   --   AST 15  --   --   GLUCOSE 79   < > 153*   < > = values in this interval not displayed.    Lab Results  Component Value Date   CKTOTAL 126 07/04/2014   CKMB 1.6 07/04/2014   TROPONINI 0.21 (H) 07/04/2014    Lab Results  Component Value Date   CHOL 122 01/25/2022   Lab Results  Component Value Date   HDL 85 01/25/2022   Lab Results  Component Value Date   LDLCALC 25 01/25/2022   Lab Results  Component Value Date   TRIG 61 01/25/2022   Lab Results  Component Value Date   CHOLHDL 1.4 01/25/2022   No results found for: "LDLDIRECT"    Radiology: CT Angio Chest PE W and/or Wo Contrast  Result Date: 02/01/2022 CLINICAL DATA:  Lung cancer.  Shortness of breath and hypoxia. * Tracking Code: BO * EXAM: CT ANGIOGRAPHY CHEST WITH CONTRAST TECHNIQUE: Multidetector CT imaging of the chest was performed using the standard protocol during bolus  administration of intravenous contrast. Multiplanar CT image reconstructions and MIPs were obtained to  evaluate the vascular anatomy. RADIATION DOSE REDUCTION: This exam was performed according to the departmental dose-optimization program which includes automated exposure control, adjustment of the mA and/or kV according to patient size and/or use of iterative reconstruction technique. CONTRAST:  30m OMNIPAQUE IOHEXOL 350 MG/ML SOLN COMPARISON:  PET-CT 11/21/2021 and chest CT 11/15/2021 FINDINGS: Cardiovascular: Left Port-A-Cath tip: Distal SVC. Possible fibrin sheath distally. Scattered findings of chronic pulmonary embolus. This includes in the left pulmonary artery on image 185 where there are peripheral string like filling defects; the peripheral chronic filling defect along the margin of the right lower lobe pulmonary artery on image 213 series 7; an adjacent web-like filling defect in a segmental branch of the right lower lobe pulmonary artery on image 212 of series 7; and a web like filling defect at the origin of a left lower lobe segmental branch on image 260 series 7. These all have characteristic appearance of chronic embolus, and I do not discern current findings of acute pulmonary embolus or substantial worsening of appearance compared to prior exams. Mild prominence the main pulmonary artery at 3.7 cm diameter, potentially reflecting pulmonary arterial hypertension. Coronary, aortic arch, and branch vessel atherosclerotic vascular disease. Mediastinum/Nodes: No pathologic adenopathy. Lungs/Pleura: Right upper lobectomy. Stable radiation therapy related findings in the right perihilar region. Improved airspace opacity around peripheral cavitary process in the right lower lobe, currently the cavity or air cyst measures 1.4 by 1.4 cm on image 86 of series 6 and has wall thickness of about 1-2 mm. Similarly mild wall thickening associated with a second bulla or small cavity more medially on image 87 series 6. Patchy peripheral mainly tree-in-bud reticulonodular opacities in the posterior basal segment  right lower lobe, images 95 through 135 series 6, favoring atypical infection. Airway thickening and some airway plugging in the right lower lobe. Mosaic attenuation in both lungs. Underlying emphysema. Mild tree-in-bud reticulonodular opacities increased in the posterior basal segment left lower lobe and in the lingula. Upper Abdomen: Unremarkable Musculoskeletal: Unremarkable Review of the MIP images confirms the above findings. IMPRESSION: 1. Bilateral chronic pulmonary embolus but no acute pulmonary embolus. 2. Probable pulmonary arterial hypertension. 3.  Aortic Atherosclerosis (ICD10-I70.0).  Coronary atherosclerosis. 4. Improved airspace opacity along the peripheral cavitary process in the right lower lobe, currently the thin walled air cyst or cavity measures about 1.4 cm in diameter. 5. Increased peripheral tree-in-bud reticulonodular opacities in both lower lobes in the lingula, compatible with atypical infection. 6. Airway thickening and airway plugging particularly in the right lower lobe. 7. Mosaic attenuation in the lungs, stable but nonspecific. 8.  Emphysema (ICD10-J43.9). 9. Stable radiation therapy related findings in the right perihilar regions. Right upper lobectomy. Electronically Signed   By: WVan ClinesM.D.   On: 02/01/2022 17:21   DG Chest 2 View  Result Date: 02/01/2022 CLINICAL DATA:  Shortness of breath EXAM: CHEST - 2 VIEW COMPARISON:  Previous studies including examination of 0Aug 04, 2023FINDINGS: Cardiac size is within normal limits. There is decreased volume right lung. Linear densities are seen in right parahilar region and right lower lung field with no significant change suggesting scarring. Blunting of right lateral CP angle may suggest pleural thickening. There are no new infiltrates or signs of pulmonary edema. There is no pneumothorax. Tip of chest port is seen in the course of the superior vena cava. IMPRESSION: There are no new infiltrates or signs of pulmonary  edema. Electronically Signed  By: Elmer Picker M.D.   On: 02/01/2022 12:37   DG Chest 2 View  Result Date: 01/31/2022 CLINICAL DATA:  Persistent cough EXAM: CHEST - 2 VIEW COMPARISON:  02/04/2020 FINDINGS: Postsurgical changes of right upper lobectomy with interstitial thickening and scarring. Unchanged blunting of the right costophrenic angle, likely scarring. Unchanged cardiac and mediastinal contours. Left chest port with catheter tip in the SVC. The left lung is clear. No acute osseous abnormality. IMPRESSION: Stable chest radiograph. Electronically Signed   By: Merilyn Baba M.D.   On: 01/31/2022 13:16    ECHO 01/01/2021  1. Left ventricular ejection fraction, by estimation, is 60 to 65%. The  left ventricle has normal function. The left ventricle has no regional  wall motion abnormalities. Left ventricular diastolic parameters are  consistent with Grade I diastolic  dysfunction (impaired relaxation).   2. Right ventricular systolic function is normal. The right ventricular  size is normal.   3. The mitral valve is normal in structure. Mild mitral valve  regurgitation. No evidence of mitral stenosis.   4. Tricuspid valve regurgitation is moderate to severe.   5. The aortic valve is normal in structure. Aortic valve regurgitation is  not visualized. No aortic stenosis is present.   6. The inferior vena cava is normal in size with greater than 50%  respiratory variability, suggesting right atrial pressure of 3 mmHg.   FINDINGS   Left Ventricle: Left ventricular ejection fraction, by estimation, is 60  to 65%. The left ventricle has normal function. The left ventricle has no  regional wall motion abnormalities. The left ventricular internal cavity  size was normal in size. There is   no left ventricular hypertrophy. Left ventricular diastolic parameters  are consistent with Grade I diastolic dysfunction (impaired relaxation).   Right Ventricle: The right ventricular size is  normal. No increase in  right ventricular wall thickness. Right ventricular systolic function is  normal.   Left Atrium: Left atrial size was normal in size.   Right Atrium: Right atrial size was normal in size.   Pericardium: There is no evidence of pericardial effusion.   Mitral Valve: The mitral valve is normal in structure. Mild mitral valve  regurgitation. No evidence of mitral valve stenosis.   Tricuspid Valve: The tricuspid valve is normal in structure. Tricuspid  valve regurgitation is moderate to severe. No evidence of tricuspid  stenosis.   Aortic Valve: The aortic valve is normal in structure. Aortic valve  regurgitation is not visualized. No aortic stenosis is present. Aortic  valve peak gradient measures 7.5 mmHg.   Pulmonic Valve: The pulmonic valve was normal in structure. Pulmonic valve  regurgitation is not visualized. No evidence of pulmonic stenosis.   Aorta: The aortic root is normal in size and structure.   Venous: The inferior vena cava is normal in size with greater than 50%  respiratory variability, suggesting right atrial pressure of 3 mmHg.   IAS/Shunts: No atrial level shunt detected by color flow Doppler.      LEFT VENTRICLE  PLAX 2D  LVIDd:         4.20 cm  Diastology  LVIDs:         2.90 cm  LV e' medial:    4.90 cm/s  LV PW:         1.10 cm  LV E/e' medial:  13.3  LV IVS:        1.10 cm  LV e' lateral:   6.09 cm/s  LVOT diam:  1.90 cm  LV E/e' lateral: 10.7  LVOT Area:     2.84 cm      RIGHT VENTRICLE  RV Mid diam:    4.10 cm  RV S prime:     11.10 cm/s  TAPSE (M-mode): 1.7 cm   LEFT ATRIUM             Index       RIGHT ATRIUM           Index  LA diam:        3.10 cm 1.76 cm/m  RA Area:     16.90 cm  LA Vol (A2C):   43.7 ml 24.84 ml/m RA Volume:   42.00 ml  23.87 ml/m  LA Vol (A4C):   42.4 ml 24.10 ml/m  LA Biplane Vol: 44.1 ml 25.07 ml/m   AORTIC VALVE                PULMONIC VALVE  AV Area (Vmax): 2.96 cm    PV Vmax:         0.85 m/s  AV Vmax:        137.00 cm/s PV Peak grad:   2.9 mmHg  AV Peak Grad:   7.5 mmHg    RVOT Peak grad: 1 mmHg  LVOT Vmax:      143.00 cm/s     AORTA  Ao Root diam: 3.00 cm   MITRAL VALVE               TRICUSPID VALVE  MV Area (PHT): 3.42 cm    TR Peak grad:   78.9 mmHg  MV Decel Time: 222 msec    TR Vmax:        444.00 cm/s  MV E velocity: 65.10 cm/s  MV A velocity: 77.60 cm/s  SHUNTS  MV E/A ratio:  0.84        Systemic Diam: 1.90 cm  MV A Prime:    10.6 cm/s   Isaias Cowman MD  Electronically signed by Isaias Cowman MD  Signature Date/Time: 01/02/2021/10:02:39 AM   TELEMETRY reviewed by me: During interview sinus tachycardia rate low 100s to 110s with brief paroxysms into the 120s.  EKG reviewed by me and Dr. 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  Cardiology is consulted because this patient went into a narrow complex tachycardia on 7/27.  #HCAP versus COPD exacerbation #Stage IV squamous cell carcinoma of the right lung #paroxysmal SVT likely C/W atrial flutter with conversion to sinus tach s/p adenosine x2 #chronic PE The patient presents with a 2-day history of shortness of breath, cough with sputum production and was hypoxic on admission requiring supplemental oxygen.  CTA chest showing atypical infection with significant leukocytosis to 19 and elevated procalcitonin of 45.  The morning of 7/27 patient went into a narrow complex tachycardia likely  C/W atrial flutter and was given IV adenosine x2 with conversion to sinus tachycardia. -Agree with current management of patient's underlying infection per primary team -Recommend continuing to treat causes of  increased adrenergic tone including hypoxia, pain, infection -S/p adenosine 6 mg x 1, 12 mg x 1 -continue metoprolol tartrate 12.5 mg p.o. 3 times daily  -restart home metoprolol XL 25 mg once daily and Cardizem CD 120 mg once daily as his blood pressure allows, or at discharge -continue eliquis 2.87m BID for his chronic pulmonary embolisms, close monitoring of Hgb and plts  -Monitor and replete electrolytes as needed to maintain magnesium >2, potassium >4 -No further cardiac diagnostics necessary at this time  Cardiology signing off. Please reengage if needed.   This patient's plan of care was discussed and created with Dr. PSaralyn Pilarand he is in agreement.  Signed: LTristan Schroeder, PA-C 02/03/2022, 10:00 AM [facility]

## 2022-02-03 NOTE — Progress Notes (Signed)
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 difficile, PE on Eliquis, dCHF, presents to ED 02/01/22 w/ SOB. Of note: Had visit w/ Onc as well 07/25 at which point he decliend transfer to ED, started on levaquin, antitussives, steroids, (+)neutropenia w/o fever. Had another office visit w/ Onc palliative care 07/26 and as sent to ED d/t SOB.  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 Meeting Sepsis criteria: tachycardia, tachypnea. Had abn WBC count day prior (1.5 though complicated by steroids/chemo effect). CTA negative for acute new PE. Started vanco and cefepime. Procal elevated. Given CHF Hx (no exacerbation at time of admission), less aggressive IV fluid resuscitation. IV steroids. Has 2L of new O2 requirement. Held home BP meds. Fluid restricted + NaCl tabs to treat hyponatremia 07/27: still on 2L [place]. Sodium improving. WBC increased (possible steroid effect) to 19. Tachycardia/tachypnea has resolved as of this morning = not meeting SIRS/Sepsis criteria. Then developed SVT treated w/ adenosine 6 mg then 12 mg, cardiology consulted. (Of note, previous Holter in 2020 showed PSVT he was started on Cardizem CD 120 mg daily and eventually metoprolol XL 25 mg once daily). Cardio recs: paroxysmal SVT likely C/W atrial flutter - started metoprolol tartrate 12.5 mg p.o. 3 times daily as his blood pressure and heart rate allow, goal magnesium >2, potassium >4  07/28: marked elevation in WBC likely reactive, VS have remained stable including BP on new beta blocker regimen. BCx NGx2d, sputum Cx pending.    Progression goals: continue IV abx until cultures result, continue tx COPD exacerbation and goal to wean down supplemental O2 to home requirement (RA) vs arrange home O2 if needed.   Consultants:  Cardiology   Procedures: none      Subjective: Patient reports feeling better today compared to yesterday, still requiring oxygen for SOB, has not had any palpitations/chest pain.  Denies bleeding, denies diarrhea.     ASSESSMENT & PLAN:   Principal Problem:   HCAP (healthcare-associated pneumonia) Active Problems:   COPD exacerbation ([place])   Sepsis ([place])   Squamous cell carcinoma of right lung (HCC)   Essential hypertension   Pulmonary embolus (HCC)   Hypokalemia   Hypomagnesemia   Hyponatremia   Severe protein-calorie malnutrition (HCC)   Thrombocytopenia (HCC)   Chronic diastolic CHF (congestive heart failure) (HCC)   Macrocytic anemia   SVT (supraventricular tachycardia) (HCC)   HCAP (healthcare-associated pneumonia) Sepsis due to HCAP and COPD exacerbation: pt meets criteria for sepsis with heart rate 120, RR 23.   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 Admited to progressive unit as inpatient IV Vancomycin and cefepime, added Doxy to cover atypicals given CT findings Incentive spirometry Solu-Medrol 40 mg twice daily Mucinex for cough  Bronchodilators Urine legionella and S. pneumococcal antigen Follow up blood culture x2, sputum culture covid19 PCR: negatve will get Procalcitonin and trend lactic acid level per sepsis protocol IVF: 500 ml of NS bolus in ED (patient has diastolic congestive heart failure with elevated BNP 920, limiting aggressive IV fluids treatment)  COPD exacerbation (HCC) see above  Sepsis ([place]) see above  Squamous cell carcinoma of right lung ([place])  S/p of surgery, currently on chemo and immunotherapy with ramucirumab.  Last dose was 7/19 Follow-up with Dr. Tasia Catchings of oncology  Essential hypertension Blood pressure is  soft due to sepsis Initially held home blood pressure medications  will put on beta blocker given SVT; per cardiology will keep on metoprolol tartrate 12.5 mg 3 times daily and eventually transition to metoprolol and Cardizem home medications once BP allows   Pulmonary embolus ([place]) CT 07/26: Bilateral chronic pulmonary embolus but no acute pulmonary embolus Continue Eliquis  Hypokalemia Monitor BMP Replete as needed  Hyponatremia Sodium 129, mental status normal.  Likely due to poor oral intake Sodium improved with fluid restriction and 500 mL normal saline Monitor BMP  Hypomagnesemia Magnesium 1.1 Give 4 g magnesium sulfate by IV Check phosphorus level  Severe protein-calorie malnutrition (HCC) Body weight 58.1 kg, BMI 16.90 Nutrition consult Ensure  Thrombocytopenia (HCC) Platelet 56 --> 30, no active bleeding.   Likely due to chemotherapy Follow-up with [facility] Cardiology recommends continue Eliquis, will restart this  Chronic diastolic CHF (congestive heart failure) ([place]) 2D echo 01/01/2021 showed EF of 60 to 65% with grade 1 diastolic dysfunction.  Patient has elevated BNP at 920, but no leg edema or JVD.  Chest x-ray negative for infiltration.  Does not seem to have CHF exacerbation Watch volume status closely  Macrocytic anemia Hemoglobin 10.7.  Likely due to chemotherapy -Follow-up with CBC  SVT (supraventricular tachycardia) (HCC) Likely d/t acute illness, concern for stress d/t chronic PE  Episode 02/02/22 see note Converted after 2nd dose (12 mg) adenosine  Outpatient record review:  Last seen outpatient 12/2021 Dr. Ubaldo Glassing d/t tachycardia (referred by oncology), underwent a Holter monitor revealing an average heart rate of just over 100, continued diltiazem CD 120 mg daily.  Echo last year 12/2020 for VTach, LVEF 60-65, normal LV fxn, Grade 1 diastolic dysfunction.  Repeat echo pending Per cardiology recommendations continue metoprolol tartrate 12.5 mg p.o. 3  times daily once blood pressure allows would restart home metoprolol XL 25 mg daily and Cardizem CD 120 mg daily Maintain magnesium greater than 2, potassium greater than 4 No further cardiac diagnostics at this time, signing off as of 02/03/2022    DVT prophylaxis: eliquis Code Status: FULL (confirmed at bedside morning of 02/02/22) Family Communication: wife at bedside Disposition Plan / TOC needs: home when stable Barriers to discharge / significant pending items: treating HCAP, awaiting cultures and tapering off O2 / home O2              Objective: Vitals:   02/03/22 0757 02/03/22 0810 02/03/22 1120 02/03/22 1129  BP:  135/82 123/87   Pulse:  96 97   Resp:  16 18   Temp:  97.7 F (36.5 C) 98 F (36.7 C)   TempSrc:      SpO2: 92% 93% 94% 96%  Weight:      Height:        Intake/Output Summary (Last 24 hours) at 02/03/2022 1335 Last data filed at 02/03/2022 1300 Gross per 24 hour  Intake 940.24 ml  Output 400 ml  Net 540.24 ml   Filed Weights   02/01/22 1202  Weight: 58.1 kg    Examination:  Constitutional:  VS as above General Appearance: alert, thin, NAD Ears, Nose, Mouth, Throat: Normal appearance Neck: No masses, trachea midline Respiratory: Normal respiratory effort Breath sounds normal, no wheeze/rhonchi/rales Cardiovascular: RRR S1/S2 normal No carotid bruit or JVD No lower extremity edema Gastrointestinal: Nontender, no masses No hernia appreciated  Musculoskeletal:  No clubbing/cyanosis of digits Neurological: No cranial nerve deficit on limited exam Motor and sensation intact and symmetric Psychiatric: Normal judgment/insight Normal mood and affect       Scheduled Medications:   apixaban  2.5 mg Oral BID   cyanocobalamin  1,000 mcg Oral Daily   feeding supplement  237 mL Oral BID BM   ipratropium-albuterol  3 mL Nebulization Q4H   latanoprost  1 drop Both Eyes QHS   loratadine  10 mg Oral Daily   methylPREDNISolone  (SOLU-MEDROL) injection  40 mg Intravenous Q12H   metoprolol tartrate  12.5 mg Oral TID   mometasone-formoterol  2 puff Inhalation BID   pantoprazole  20 mg Oral Daily   sodium chloride  2 g Oral BID WC    Continuous Infusions:  sodium chloride 10 mL/hr at 02/03/22 0856   ceFEPime (MAXIPIME) IV 2 g (02/03/22 1309)   doxycycline (VIBRAMYCIN) IV 100 mg (02/03/22 0901)   vancomycin Stopped (02/02/22 2203)    PRN Medications:  sodium chloride, acetaminophen, albuterol, dextromethorphan-guaiFENesin, dextrose, diphenhydrAMINE, HYDROcodone-acetaminophen, methocarbamol  Antimicrobials:  Anti-infectives (From admission, onward)    Start     Dose/Rate Route Frequency Ordered Stop   02/02/22 2100  vancomycin (VANCOREADY) IVPB 1250 mg/250 mL        1,250 mg 166.7 mL/hr over 90 Minutes Intravenous Every 24 hours 02/01/22 1904     02/02/22 1015  doxycycline (VIBRAMYCIN) 100 mg in sodium chloride 0.9 % 250 mL IVPB        100 mg 125 mL/hr over 120 Minutes Intravenous Every 12 hours 02/02/22 1013     02/01/22 2000  ceFEPIme (MAXIPIME) 2 g in sodium chloride 0.9 % 100 mL IVPB        2 g 200 mL/hr over 30 Minutes Intravenous Every 8 hours 02/01/22 1845     02/01/22 1930  vancomycin (VANCOREADY) IVPB 1250 mg/250 mL        1,250 mg 166.7 mL/hr over 90 Minutes Intravenous  Once 02/01/22 1858 02/01/22 2115   02/01/22 1745  azithromycin (ZITHROMAX) 500 mg in sodium chloride 0.9 % 250 mL IVPB  Status:  Discontinued        500 mg 250 mL/hr over 60 Minutes Intravenous  Once 02/01/22 1738 02/01/22 1837   02/01/22 1745  cefTRIAXone (ROCEPHIN) 1 g in sodium chloride 0.9 % 100 mL IVPB  Status:  Discontinued        1 g 200 mL/hr over 30 Minutes Intravenous  Once 02/01/22 1738 02/01/22 1837       Data Reviewed: I have personally reviewed following labs and imaging studies  CBC: Recent Labs  Lab 01/31/22 1323 02/01/22 1040 02/02/22 0143 02/03/22 0527  WBC 1.5* 7.6 19.4* 48.9*  NEUTROABS 0.6* 6.0   --   --   HGB 11.8* 10.7* 10.4* 8.8*  HCT 33.2* 30.3* 30.6* 24.5*  MCV 101.8* 100.7* 103.4* 98.0  PLT 78* 56* 38* 30*   Basic Metabolic Panel: Recent Labs  Lab 02/01/22 1040 02/01/22 1815 02/02/22 0143 02/02/22 1250 02/02/22 1941 02/03/22 0500 02/03/22 0527  NA 129* 131* 132* 133* 132*  --  133*  K 3.4* 3.8 4.5 4.0 4.3  --  3.9  CL 97* 99 102 103 104  --  106  CO2 21* 17* 17* 19* 17*  --  20*  GLUCOSE 62* 42* 91 222* 178*  --  153*  BUN 21* 19 18 22* 27*  --  26*  CREATININE 1.06 1.02 1.06 1.13 1.17  --  1.11  CALCIUM 7.7* 7.8* 7.9* 8.2* 8.3*  --  8.2*  MG 1.1*  --  2.4  --   --  2.0  --   PHOS  --  2.3*  --   --   --   --   --    GFR: Estimated Creatinine Clearance: 61.1 mL/min (by C-G formula based on SCr of 1.11 mg/dL). Liver Function Tests: Recent Labs  Lab 01/31/22 1323  AST 15  ALT 10  ALKPHOS 76  BILITOT 1.2  PROT 6.4*  ALBUMIN 2.8*   No results for input(s): "LIPASE", "AMYLASE" in the last 168 hours. No results for input(s): "AMMONIA" in the last 168 hours. Coagulation Profile: No results for input(s): "INR", "PROTIME" in the last 168 hours. Cardiac Enzymes: No results for input(s): "CKTOTAL", "CKMB", "CKMBINDEX", "TROPONINI" in the last 168 hours. BNP (last 3 results) No results for input(s): "PROBNP" in the last 8760 hours. HbA1C: No results for input(s): "HGBA1C" in the last 72 hours. CBG: Recent Labs  Lab 02/02/22 0754 02/02/22 1153 02/02/22 2057 02/03/22 0808 02/03/22 1122  GLUCAP 100* 183* 193* 159* 157*   Lipid Profile: No results for input(s): "CHOL", "HDL", "LDLCALC", "TRIG", "CHOLHDL", "LDLDIRECT" in the last 72 hours. Thyroid Function Tests: No results for input(s): "TSH", "T4TOTAL", "FREET4", "T3FREE", "THYROIDAB" in the last 72 hours. Anemia Panel: No results for input(s): "VITAMINB12", "FOLATE", "FERRITIN", "TIBC", "IRON", "RETICCTPCT" in the last 72 hours. Urine analysis:    Component Value Date/Time   COLORURINE Yellow  11/02/2014 1900   APPEARANCEUR Hazy 11/02/2014 1900   LABSPEC 1.013 11/02/2014 1900   PHURINE 9.0 11/02/2014 1900   GLUCOSEU Negative 11/02/2014 1900   HGBUR 1+ 11/02/2014 1900   BILIRUBINUR Negative 11/02/2014 1900   KETONESUR Negative 11/02/2014 1900   PROTEINUR 30 mg/dL 11/02/2014 1900   NITRITE Negative 11/02/2014 1900   LEUKOCYTESUR 3+ 11/02/2014 1900   Sepsis Labs: @LABRCNTIP (procalcitonin:4,lacticidven:4)  Recent Results (from the past 240 hour(s))  Blood culture (routine x 2)     Status: None (Preliminary result)   Collection Time: 02/01/22  5:39 PM   Specimen: BLOOD RIGHT FOREARM  Result Value Ref Range Status   Specimen Description BLOOD RIGHT FOREARM  Final   Special Requests   Final    BOTTLES DRAWN AEROBIC AND ANAEROBIC Blood Culture results may not be optimal due to an inadequate volume of blood received in culture bottles   Culture   Final    NO GROWTH 2 DAYS Performed at [facility], [address] [place], [place] [address]    Report Status PENDING  Incomplete  Blood culture (routine x 2)     Status: None (Preliminary result)   Collection Time: 02/01/22  5:44 PM   Specimen: BLOOD RIGHT HAND  Result Value Ref Range Status   Specimen Description BLOOD RIGHT HAND  Final   Special Requests   Final    BOTTLES DRAWN AEROBIC AND ANAEROBIC Blood Culture adequate volume   Culture   Final    NO GROWTH 2 DAYS Performed at [facility], [address] [place], [place] [address]    Report Status PENDING  Incomplete  SARS Coronavirus 2 by RT PCR (hospital order, performed in [facility] hospital lab) *cepheid single result test* Anterior Nasal Swab     Status: None   Collection Time: 02/01/22  6:50 PM   Specimen: Anterior Nasal Swab  Result Value Ref Range Status   SARS Coronavirus 2 by RT PCR NEGATIVE NEGATIVE Final  Comment: (NOTE) SARS-CoV-2 target nucleic acids are NOT DETECTED.  The SARS-CoV-2 RNA is generally detectable in upper and  lower respiratory specimens during the acute phase of infection. The lowest concentration of SARS-CoV-2 viral copies this assay can detect is 250 copies / mL. A negative result does not preclude SARS-CoV-2 infection and should not be used as the sole basis for treatment or other patient management decisions.  A negative result may occur with improper specimen collection / handling, submission of specimen other than nasopharyngeal swab, presence of viral mutation(s) within the areas targeted by this assay, and inadequate number of viral copies (<250 copies / mL). A negative result must be combined with clinical observations, patient history, and epidemiological information.  Fact Sheet for Patients:   https://www.patel.info/  Fact Sheet for Healthcare Providers: https://hall.com/  This test is not yet approved or  cleared by the [place] FDA and has been authorized for detection and/or diagnosis of SARS-CoV-2 by FDA under an Emergency Use Authorization (EUA).  This EUA will remain in effect (meaning this test can be used) for the duration of the COVID-19 declaration under Section 564(b)(1) of the Act, 21 U.S.C. section 360bbb-3(b)(1), unless the authorization is terminated or revoked sooner.  Performed at [facility], [address], [place], [place] [address]          Radiology Studies last 96 hours: CT Angio Chest PE W and/or Wo Contrast  Result Date: 02/01/2022 CLINICAL DATA:  Lung cancer.  Shortness of breath and hypoxia. * Tracking Code: BO * EXAM: CT ANGIOGRAPHY CHEST WITH CONTRAST TECHNIQUE: Multidetector CT imaging of the chest was performed using the standard protocol during bolus administration of intravenous contrast. Multiplanar CT image reconstructions and MIPs were obtained to evaluate the vascular anatomy. RADIATION DOSE REDUCTION: This exam was performed according to the departmental dose-optimization  program which includes automated exposure control, adjustment of the mA and/or kV according to patient size and/or use of iterative reconstruction technique. CONTRAST:  27mL OMNIPAQUE IOHEXOL 350 MG/ML SOLN COMPARISON:  PET-CT 11/21/2021 and chest CT 11/15/2021 FINDINGS: Cardiovascular: Left Port-A-Cath tip: Distal SVC. Possible fibrin sheath distally. Scattered findings of chronic pulmonary embolus. This includes in the left pulmonary artery on image 185 where there are peripheral string like filling defects; the peripheral chronic filling defect along the margin of the right lower lobe pulmonary artery on image 213 series 7; an adjacent web-like filling defect in a segmental branch of the right lower lobe pulmonary artery on image 212 of series 7; and a web like filling defect at the origin of a left lower lobe segmental branch on image 260 series 7. These all have characteristic appearance of chronic embolus, and I do not discern current findings of acute pulmonary embolus or substantial worsening of appearance compared to prior exams. Mild prominence the main pulmonary artery at 3.7 cm diameter, potentially reflecting pulmonary arterial hypertension. Coronary, aortic arch, and branch vessel atherosclerotic vascular disease. Mediastinum/Nodes: No pathologic adenopathy. Lungs/Pleura: Right upper lobectomy. Stable radiation therapy related findings in the right perihilar region. Improved airspace opacity around peripheral cavitary process in the right lower lobe, currently the cavity or air cyst measures 1.4 by 1.4 cm on image 86 of series 6 and has wall thickness of about 1-2 mm. Similarly mild wall thickening associated with a second bulla or small cavity more medially on image 87 series 6. Patchy peripheral mainly tree-in-bud reticulonodular opacities in the posterior basal segment right lower lobe, images 95 through 135 series 6, favoring atypical infection.  Airway thickening and some airway plugging in the  right lower lobe. Mosaic attenuation in both lungs. Underlying emphysema. Mild tree-in-bud reticulonodular opacities increased in the posterior basal segment left lower lobe and in the lingula. Upper Abdomen: Unremarkable Musculoskeletal: Unremarkable Review of the MIP images confirms the above findings. IMPRESSION: 1. Bilateral chronic pulmonary embolus but no acute pulmonary embolus. 2. Probable pulmonary arterial hypertension. 3.  Aortic Atherosclerosis (ICD10-I70.0).  Coronary atherosclerosis. 4. Improved airspace opacity along the peripheral cavitary process in the right lower lobe, currently the thin walled air cyst or cavity measures about 1.4 cm in diameter. 5. Increased peripheral tree-in-bud reticulonodular opacities in both lower lobes in the lingula, compatible with atypical infection. 6. Airway thickening and airway plugging particularly in the right lower lobe. 7. Mosaic attenuation in the lungs, stable but nonspecific. 8.  Emphysema (ICD10-J43.9). 9. Stable radiation therapy related findings in the right perihilar regions. Right upper lobectomy. Electronically Signed   By: Van Clines M.D.   On: 02/01/2022 17:21   DG Chest 2 View  Result Date: 02/01/2022 CLINICAL DATA:  Shortness of breath EXAM: CHEST - 2 VIEW COMPARISON:  Previous studies including examination of 2022/02/09 FINDINGS: Cardiac size is within normal limits. There is decreased volume right lung. Linear densities are seen in right parahilar region and right lower lung field with no significant change suggesting scarring. Blunting of right lateral CP angle may suggest pleural thickening. There are no new infiltrates or signs of pulmonary edema. There is no pneumothorax. Tip of chest port is seen in the course of the superior vena cava. IMPRESSION: There are no new infiltrates or signs of pulmonary edema. Electronically Signed   By: Elmer Picker M.D.   On: 02/01/2022 12:37   DG Chest 2 View  Result Date:  09-Feb-2022 CLINICAL DATA:  Persistent cough EXAM: CHEST - 2 VIEW COMPARISON:  02/04/2020 FINDINGS: Postsurgical changes of right upper lobectomy with interstitial thickening and scarring. Unchanged blunting of the right costophrenic angle, likely scarring. Unchanged cardiac and mediastinal contours. Left chest port with catheter tip in the SVC. The left lung is clear. No acute osseous abnormality. IMPRESSION: Stable chest radiograph. Electronically Signed   By: Merilyn Baba M.D.   On: February 09, 2022 13:16            LOS: 2 days    Time spent: 50 min     Emeterio Reeve, DO [facility] 02/03/2022, 1:35 PM   Staff may message me via secure chat in [place]  but this may not receive immediate response,  please page for urgent matters!  If 7PM-7AM, please contact night-coverage [email or web address]  Dictation software was used to generate the above note. Typos may occur and escape review, as with typed/written notes. Please contact Dr Sheppard Coil directly for clarity if needed.

## 2022-02-03 NOTE — Progress Notes (Signed)
  Echocardiogram 2D Echocardiogram has been performed.  Samuel Choi 02/03/2022, 3:06 PM

## 2022-02-04 ENCOUNTER — Encounter: Payer: Self-pay | Admitting: Oncology

## 2022-02-04 DIAGNOSIS — J189 Pneumonia, unspecified organism: Secondary | ICD-10-CM | POA: Diagnosis not present

## 2022-02-04 LAB — ECHOCARDIOGRAM COMPLETE
Area-P 1/2: 4.19 cm2
Height: 73 in
S' Lateral: 2.95 cm
Weight: 2049.4 oz

## 2022-02-04 LAB — CBC WITH DIFFERENTIAL/PLATELET
Abs Immature Granulocytes: 16.27 10*3/uL — ABNORMAL HIGH (ref 0.00–0.07)
Basophils Absolute: 0.1 10*3/uL (ref 0.0–0.1)
Basophils Relative: 0 %
Eosinophils Absolute: 0 10*3/uL (ref 0.0–0.5)
Eosinophils Relative: 0 %
HCT: 24.6 % — ABNORMAL LOW (ref 39.0–52.0)
Hemoglobin: 8.7 g/dL — ABNORMAL LOW (ref 13.0–17.0)
Immature Granulocytes: 26 %
Lymphocytes Relative: 1 %
Lymphs Abs: 0.5 10*3/uL — ABNORMAL LOW (ref 0.7–4.0)
MCH: 35.2 pg — ABNORMAL HIGH (ref 26.0–34.0)
MCHC: 35.4 g/dL (ref 30.0–36.0)
MCV: 99.6 fL (ref 80.0–100.0)
Monocytes Absolute: 5 10*3/uL — ABNORMAL HIGH (ref 0.1–1.0)
Monocytes Relative: 8 %
Neutro Abs: 41.2 10*3/uL — ABNORMAL HIGH (ref 1.7–7.7)
Neutrophils Relative %: 65 %
Platelets: 39 10*3/uL — ABNORMAL LOW (ref 150–400)
RBC: 2.47 MIL/uL — ABNORMAL LOW (ref 4.22–5.81)
RDW: 14.9 % (ref 11.5–15.5)
Smear Review: DECREASED
WBC: 63 10*3/uL (ref 4.0–10.5)
nRBC: 0.4 % — ABNORMAL HIGH (ref 0.0–0.2)

## 2022-02-04 LAB — BASIC METABOLIC PANEL
Anion gap: 6 (ref 5–15)
BUN: 32 mg/dL — ABNORMAL HIGH (ref 6–20)
CO2: 20 mmol/L — ABNORMAL LOW (ref 22–32)
Calcium: 8.9 mg/dL (ref 8.9–10.3)
Chloride: 110 mmol/L (ref 98–111)
Creatinine, Ser: 1.07 mg/dL (ref 0.61–1.24)
GFR, Estimated: >60 mL/min (ref >60)
Glucose, Bld: 134 mg/dL — ABNORMAL HIGH (ref 70–99)
Potassium: 4.1 mmol/L (ref 3.5–5.1)
Sodium: 136 mmol/L (ref 135–145)

## 2022-02-04 LAB — THYROID PANEL WITH TSH
Free Thyroxine Index: 1.3 (ref 1.2–4.9)
T3 Uptake Ratio: 31 % (ref 24–39)
T4, Total: 4.2 ug/dL — ABNORMAL LOW (ref 4.5–12.0)
TSH: 1.46 u[IU]/mL (ref 0.450–4.500)

## 2022-02-04 LAB — GLUCOSE, CAPILLARY
Glucose-Capillary: 141 mg/dL — ABNORMAL HIGH (ref 70–99)
Glucose-Capillary: 151 mg/dL — ABNORMAL HIGH (ref 70–99)
Glucose-Capillary: 137 mg/dL — ABNORMAL HIGH (ref 70–99)
Glucose-Capillary: 139 mg/dL — ABNORMAL HIGH (ref 70–99)
Glucose-Capillary: 115 mg/dL — ABNORMAL HIGH (ref 70–99)

## 2022-02-04 MED ORDER — DILTIAZEM HCL ER COATED BEADS 120 MG PO CP24
120.0000 mg | ORAL_CAPSULE | Freq: Every day | ORAL | Status: DC
  Administered 2022-02-04 – 2022-02-05 (×2): 120 mg via ORAL
  Filled 2022-02-04 (×2): qty 1

## 2022-02-04 MED ORDER — IPRATROPIUM-ALBUTEROL 0.5-2.5 (3) MG/3ML IN SOLN
3.0000 mL | Freq: Two times a day (BID) | RESPIRATORY_TRACT | Status: DC
  Administered 2022-02-04 – 2022-02-05 (×2): 3 mL via RESPIRATORY_TRACT
  Filled 2022-02-04 (×2): qty 3

## 2022-02-04 MED ORDER — METOPROLOL SUCCINATE ER 25 MG PO TB24
25.0000 mg | ORAL_TABLET | Freq: Every day | ORAL | Status: DC
  Administered 2022-02-04 – 2022-02-05 (×2): 25 mg via ORAL
  Filled 2022-02-04 (×2): qty 1

## 2022-02-04 NOTE — Progress Notes (Signed)
Silvestre Gunner called with lab results for this patient. Patient has a WBC of 63. Will inform oncoming nurse.

## 2022-02-04 NOTE — Progress Notes (Signed)
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 difficile, PE on Eliquis, dCHF, presents to ED 02/01/22 w/ SOB. Of note: Had visit w/ Onc as well 07/25 at which point he decliend transfer to ED, started on levaquin, antitussives, steroids, (+)neutropenia w/o fever. Had another office visit w/ Onc palliative care 07/26 and as sent to ED d/t SOB.  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 Meeting Sepsis criteria: tachycardia, tachypnea. Had abn WBC count day prior (1.5 though complicated by steroids/chemo effect). CTA negative for acute new PE. Started vanco and cefepime. Procal elevated. Given CHF Hx (no exacerbation at time of admission), less aggressive IV fluid resuscitation. IV steroids. Has 2L of new O2 requirement. Held home BP meds. Fluid restricted + NaCl tabs to treat hyponatremia 07/27: still on 2L [place]. Sodium improving. WBC increased (possible steroid effect) to 19. Tachycardia/tachypnea has resolved as of this morning = not meeting SIRS/Sepsis criteria. Then developed SVT treated w/ adenosine 6 mg then 12 mg, cardiology consulted. (Of note, previous Holter in 2020 showed PSVT he was started on Cardizem CD 120 mg daily and eventually metoprolol XL 25 mg once daily). Cardio recs: paroxysmal SVT likely C/W atrial flutter - started metoprolol tartrate 12.5 mg p.o. 3 times daily as his blood pressure and heart rate allow, goal magnesium >2, potassium >4  07/28: marked elevation in WBC likely reactive, VS have remained stable including BP on new beta blocker regimen. BCx NGx2d, sputum Cx pending.   07/29: WBC increased further, elevated Neut ct, VSS, SpO2 92% on RA since early this morning. Echo pending read as of this morning. MRSA neg, d/c Vanc and if doing well through tonight should be stable for discharge tomorrow. Confirmed w/ Onc no need to be overly concerned about WBC, likely d/t recent treatment and their office will call to set up earlier follow-up.   Progression goals:  continue IV abx - blood cx negative, may consider transition to po if can confirm elevated WBC more likely d/t reactive/chemo effect  continue tx COPD exacerbation - may be able to transition to po / home meds tomorrow if no worsening w/ d/c vanc  wean down supplemental O2 to home requirement (RA) - done Echocardiogram results - pending  Consultants:  Cardiology   Procedures: none      Subjective: Patient reports feeling better today compared to yesterday, still requiring oxygen for SOB, has not had any palpitations/chest pain.  Denies bleeding, denies diarrhea.     ASSESSMENT & PLAN:   Principal Problem:   HCAP (healthcare-associated pneumonia) Active Problems:   COPD exacerbation ([place])   Sepsis ([place])   Squamous cell carcinoma of right lung (HCC)   Essential hypertension   Pulmonary embolus (HCC)   Hypokalemia   Hypomagnesemia   Hyponatremia   Severe protein-calorie malnutrition (HCC)   Thrombocytopenia (HCC)   Chronic diastolic CHF (congestive heart failure) (HCC)   Macrocytic anemia   SVT (supraventricular tachycardia) (HCC)   HCAP (healthcare-associated pneumonia) Sepsis due to HCAP and COPD exacerbation: pt meets criteria for sepsis with heart rate 120, RR 23.   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 NEG  COVID, strep/legionella, MRA screen IV Vancomycin and cefepime, added Doxy to cover atypicals given CT findings --> d/c Vanc 02/04/22  Incentive spirometry Solu-Medrol 40 mg twice daily --> po on discharge  Mucinex for cough  Bronchodilators  COPD  exacerbation ([place]) see above  Sepsis ([place]) see above  Squamous cell carcinoma of right lung ([place]) S/p of surgery, currently on chemo and immunotherapy with ramucirumab.  Last dose was 7/19 Follow-up with Dr. Tasia Catchings of oncology  Essential hypertension Blood pressure is soft due to sepsis Initially held home blood pressure medications  transition to metoprolol and Cardizem home medications 07/29   Pulmonary embolus ([place]) CT 07/26: Bilateral chronic pulmonary embolus but no acute pulmonary embolus Continue Eliquis  Hypokalemia Monitor BMP Replete as needed  Hyponatremia Sodium 129, mental status normal.  Likely due to poor oral intake Sodium improved with fluid restriction and 500 mL normal saline Monitor BMP  Hypomagnesemia Magnesium 1.1 Give 4 g magnesium sulfate by IV Check phosphorus level  Severe protein-calorie malnutrition (HCC) Body weight 58.1 kg, BMI 16.90 Nutrition consult Ensure  Thrombocytopenia (HCC) Platelet 56 --> 30, no active bleeding.   Likely due to chemotherapy Follow-up with [facility] Cardiology recommends continue Eliquis, will restart this  Chronic diastolic CHF (congestive heart failure) ([place]) 2D echo 01/01/2021 showed EF of 60 to 65% with grade 1 diastolic dysfunction.  Patient has elevated BNP at 920, but no leg edema or JVD.  Chest x-ray negative for infiltration.  Does not seem to have CHF exacerbation Watch volume status closely  Macrocytic anemia Hemoglobin 10.7.  Likely due to chemotherapy -Follow-up with CBC  SVT (supraventricular tachycardia) (HCC) Likely d/t acute illness, concern for stress d/t chronic PE  Episode 02/02/22 see note Converted after 2nd dose (12 mg) adenosine  Outpatient record review:  Last seen outpatient 12/2021 Dr. Ubaldo Glassing d/t tachycardia (referred by oncology), underwent a Holter monitor revealing an average heart rate of just over 100, continued diltiazem CD 120 mg daily.  Echo last year 12/2020 for VTach, LVEF 60-65,  normal LV fxn, Grade 1 diastolic dysfunction.  Repeat echo pending read Per cardiology recommendations continue metoprolol tartrate 12.5 mg p.o. 3 times daily --> once blood pressure allows would restart home metoprolol XL 25 mg daily and Cardizem CD 120 mg daily --> restarted these 02/04/22  Maintain magnesium greater than 2, potassium greater than 4 No further cardiac diagnostics at this time, signing off as of 02/03/2022    DVT prophylaxis: eliquis Code Status: FULL (confirmed at bedside morning of 02/02/22) Family Communication: brother at bedside Disposition Plan / TOC needs: home Barriers to discharge / significant pending items: treating HCAP, awaiting echo results, anticipate d/c home tomorrow if remains stable              Objective: Vitals:   02/04/22 0410 02/04/22 0500 02/04/22 0718 02/04/22 1115  BP: 135/79  135/83 128/90  Pulse: 95  100 100  Resp: 19  18 16   Temp: 98.2 F (36.8 C)  98.1 F (36.7 C) 98.1 F (36.7 C)  TempSrc: Oral     SpO2: 92%  92% 90%  Weight:  57.3 kg    Height:        Intake/Output Summary (Last 24 hours) at 02/04/2022 1332 Last data filed at 02/04/2022 1231 Gross per 24 hour  Intake 1316.29 ml  Output 0 ml  Net 1316.29 ml   Filed Weights   02/01/22 1202 02/04/22 0500  Weight: 58.1 kg 57.3 kg    Examination:  Constitutional:  VS as above General Appearance: alert, thin, NAD Ears, Nose, Mouth, Throat: Normal appearance Neck: No masses, trachea midline Respiratory: Normal respiratory effort Breath sounds normal, no wheeze/rhonchi/rales Cardiovascular: RRR S1/S2 normal No carotid bruit or JVD No lower extremity edema Gastrointestinal: Nontender, no masses No hernia appreciated Musculoskeletal:  No clubbing/cyanosis of digits Neurological: No cranial nerve deficit on limited exam Motor and sensation intact and symmetric Psychiatric: Normal judgment/insight Normal mood and affect       Scheduled  Medications:   apixaban  2.5 mg Oral BID   Chlorhexidine Gluconate Cloth  6 each Topical Q0600   cyanocobalamin  1,000 mcg Oral Daily   diltiazem  120 mg Oral Daily   feeding supplement  237 mL Oral TID BM   ipratropium-albuterol  3 mL Nebulization BID   latanoprost  1 drop Both Eyes QHS   loratadine  10 mg Oral Daily   methylPREDNISolone (SOLU-MEDROL) injection  40 mg Intravenous Q12H   metoprolol succinate  25 mg Oral Daily   mometasone-formoterol  2 puff Inhalation BID   multivitamin with minerals  1 tablet Oral Daily   pantoprazole  20 mg Oral Daily   sodium chloride  2 g Oral BID WC    Continuous Infusions:  sodium chloride 10 mL/hr at 02/04/22 0357   ceFEPime (MAXIPIME) IV 2 g (02/04/22 0531)   doxycycline (VIBRAMYCIN) IV 100 mg (02/04/22 0809)    PRN Medications:  sodium chloride, acetaminophen, albuterol, dextromethorphan-guaiFENesin, dextrose, diphenhydrAMINE, HYDROcodone-acetaminophen, methocarbamol, mouth rinse  Antimicrobials:  Anti-infectives (From admission, onward)    Start     Dose/Rate Route Frequency Ordered Stop   02/02/22 2100  vancomycin (VANCOREADY) IVPB 1250 mg/250 mL  Status:  Discontinued        1,250 mg 166.7 mL/hr over 90 Minutes Intravenous Every 24 hours 02/01/22 1904 02/04/22 1329   02/02/22 1015  doxycycline (VIBRAMYCIN) 100 mg in sodium chloride 0.9 % 250 mL IVPB        100 mg 125 mL/hr over 120 Minutes Intravenous Every 12 hours 02/02/22 1013     02/01/22 2000  ceFEPIme (MAXIPIME) 2 g in sodium chloride 0.9 % 100 mL IVPB        2 g 200 mL/hr over 30 Minutes Intravenous Every 8 hours 02/01/22 1845     02/01/22 1930  vancomycin (VANCOREADY) IVPB 1250 mg/250 mL        1,250 mg 166.7 mL/hr over 90 Minutes Intravenous  Once 02/01/22 1858 02/01/22 2115   02/01/22 1745  azithromycin (ZITHROMAX) 500 mg in sodium chloride 0.9 % 250 mL IVPB  Status:  Discontinued        500 mg 250 mL/hr over 60 Minutes Intravenous  Once 02/01/22 1738 02/01/22 1837    02/01/22 1745  cefTRIAXone (ROCEPHIN) 1 g in sodium chloride 0.9 % 100 mL IVPB  Status:  Discontinued        1 g 200 mL/hr over 30 Minutes Intravenous  Once 02/01/22 1738 02/01/22 1837       Data Reviewed: I have personally reviewed following labs and imaging studies  CBC: Recent Labs  Lab 01/31/22 1323 02/01/22 1040 02/02/22 0143 02/03/22 0527 02/04/22 0500  WBC 1.5* 7.6 19.4* 48.9* 63.0*  NEUTROABS 0.6* 6.0  --   --  41.2*  HGB 11.8* 10.7* 10.4* 8.8* 8.7*  HCT 33.2* 30.3* 30.6* 24.5* 24.6*  MCV 101.8* 100.7* 103.4* 98.0 99.6  PLT 78* 56* 38* 30* 39*   Basic Metabolic Panel: Recent Labs  Lab 02/01/22 1040 02/01/22 1815  02/02/22 0143 02/02/22 1250 02/02/22 1941 02/03/22 0500 02/03/22 0527 02/04/22 0500  NA 129* 131* 132* 133* 132*  --  133* 136  K 3.4* 3.8 4.5 4.0 4.3  --  3.9 4.1  CL 97* 99 102 103 104  --  106 110  CO2 21* 17* 17* 19* 17*  --  20* 20*  GLUCOSE 62* 42* 91 222* 178*  --  153* 134*  BUN 21* 19 18 22* 27*  --  26* 32*  CREATININE 1.06 1.02 1.06 1.13 1.17  --  1.11 1.07  CALCIUM 7.7* 7.8* 7.9* 8.2* 8.3*  --  8.2* 8.9  MG 1.1*  --  2.4  --   --  2.0  --   --   PHOS  --  2.3*  --   --   --   --   --   --    GFR: Estimated Creatinine Clearance: 62.5 mL/min (by C-G formula based on SCr of 1.07 mg/dL). Liver Function Tests: Recent Labs  Lab 01/31/22 1323  AST 15  ALT 10  ALKPHOS 76  BILITOT 1.2  PROT 6.4*  ALBUMIN 2.8*   No results for input(s): "LIPASE", "AMYLASE" in the last 168 hours. No results for input(s): "AMMONIA" in the last 168 hours. Coagulation Profile: No results for input(s): "INR", "PROTIME" in the last 168 hours. Cardiac Enzymes: No results for input(s): "CKTOTAL", "CKMB", "CKMBINDEX", "TROPONINI" in the last 168 hours. BNP (last 3 results) No results for input(s): "PROBNP" in the last 8760 hours. HbA1C: No results for input(s): "HGBA1C" in the last 72 hours. CBG: Recent Labs  Lab 02/03/22 1645 02/03/22 2126  02/04/22 0353 02/04/22 0718 02/04/22 1114  GLUCAP 180* 157* 141* 151* 137*   Lipid Profile: No results for input(s): "CHOL", "HDL", "LDLCALC", "TRIG", "CHOLHDL", "LDLDIRECT" in the last 72 hours. Thyroid Function Tests: Recent Labs    02/03/22 0527  TSH 1.460  T4TOTAL 4.2*   Anemia Panel: No results for input(s): "VITAMINB12", "FOLATE", "FERRITIN", "TIBC", "IRON", "RETICCTPCT" in the last 72 hours. Urine analysis:    Component Value Date/Time   COLORURINE Yellow 11/02/2014 1900   APPEARANCEUR Hazy 11/02/2014 1900   LABSPEC 1.013 11/02/2014 1900   PHURINE 9.0 11/02/2014 1900   GLUCOSEU Negative 11/02/2014 1900   HGBUR 1+ 11/02/2014 1900   BILIRUBINUR Negative 11/02/2014 1900   KETONESUR Negative 11/02/2014 1900   PROTEINUR 30 mg/dL 11/02/2014 1900   NITRITE Negative 11/02/2014 1900   LEUKOCYTESUR 3+ 11/02/2014 1900   Sepsis Labs: @LABRCNTIP (procalcitonin:4,lacticidven:4)  Recent Results (from the past 240 hour(s))  Blood culture (routine x 2)     Status: None (Preliminary result)   Collection Time: 02/01/22  5:39 PM   Specimen: BLOOD RIGHT FOREARM  Result Value Ref Range Status   Specimen Description BLOOD RIGHT FOREARM  Final   Special Requests   Final    BOTTLES DRAWN AEROBIC AND ANAEROBIC Blood Culture results may not be optimal due to an inadequate volume of blood received in culture bottles   Culture   Final    NO GROWTH 3 DAYS Performed at [facility], [address] [place], [place] [address]    Report Status PENDING  Incomplete  Blood culture (routine x 2)     Status: None (Preliminary result)   Collection Time: 02/01/22  5:44 PM   Specimen: BLOOD RIGHT HAND  Result Value Ref Range Status   Specimen Description BLOOD RIGHT HAND  Final   Special Requests   Final    BOTTLES  DRAWN AEROBIC AND ANAEROBIC Blood Culture adequate volume   Culture   Final    NO GROWTH 3 DAYS Performed at [facility], [address] [place], [place]  [address]    Report Status PENDING  Incomplete  SARS Coronavirus 2 by RT PCR (hospital order, performed in [facility] hospital lab) *cepheid single result test* Anterior Nasal Swab     Status: None   Collection Time: 02/01/22  6:50 PM   Specimen: Anterior Nasal Swab  Result Value Ref Range Status   SARS Coronavirus 2 by RT PCR NEGATIVE NEGATIVE Final    Comment: (NOTE) SARS-CoV-2 target nucleic acids are NOT DETECTED.  The SARS-CoV-2 RNA is generally detectable in upper and lower respiratory specimens during the acute phase of infection. The lowest concentration of SARS-CoV-2 viral copies this assay can detect is 250 copies / mL. A negative result does not preclude SARS-CoV-2 infection and should not be used as the sole basis for treatment or other patient management decisions.  A negative result may occur with improper specimen collection / handling, submission of specimen other than nasopharyngeal swab, presence of viral mutation(s) within the areas targeted by this assay, and inadequate number of viral copies (<250 copies / mL). A negative result must be combined with clinical observations, patient history, and epidemiological information.  Fact Sheet for Patients:   https://www.patel.info/  Fact Sheet for Healthcare Providers: https://hall.com/  This test is not yet approved or  cleared by the [place] FDA and has been authorized for detection and/or diagnosis of SARS-CoV-2 by FDA under an Emergency Use Authorization (EUA).  This EUA will remain in effect (meaning this test can be used) for the duration of the COVID-19 declaration under Section 564(b)(1) of the Act, 21 U.S.C. section 360bbb-3(b)(1), unless the authorization is terminated or revoked sooner.  Performed at [facility], [address] [place], [place] [address]   MRSA Next Gen by PCR, Nasal     Status: None   Collection Time: 02/03/22 12:31 PM    Specimen: Nasal Mucosa; Nasal Swab  Result Value Ref Range Status   MRSA by PCR Next Gen NOT DETECTED NOT DETECTED Final    Comment: (NOTE) The GeneXpert MRSA Assay (FDA approved for NASAL specimens only), is one component of a comprehensive MRSA colonization surveillance program. It is not intended to diagnose MRSA infection nor to guide or monitor treatment for MRSA infections. Test performance is not FDA approved in patients less than 67 years old. Performed at [facility], [address], [place], [place] [address]          Radiology Studies last 96 hours: CT Angio Chest PE W and/or Wo Contrast  Result Date: 02/01/2022 CLINICAL DATA:  Lung cancer.  Shortness of breath and hypoxia. * Tracking Code: BO * EXAM: CT ANGIOGRAPHY CHEST WITH CONTRAST TECHNIQUE: Multidetector CT imaging of the chest was performed using the standard protocol during bolus administration of intravenous contrast. Multiplanar CT image reconstructions and MIPs were obtained to evaluate the vascular anatomy. RADIATION DOSE REDUCTION: This exam was performed according to the departmental dose-optimization program which includes automated exposure control, adjustment of the mA and/or kV according to patient size and/or use of iterative reconstruction technique. CONTRAST:  42mL OMNIPAQUE IOHEXOL 350 MG/ML SOLN COMPARISON:  PET-CT 11/21/2021 and chest CT 11/15/2021 FINDINGS: Cardiovascular: Left Port-A-Cath tip: Distal SVC. Possible fibrin sheath distally. Scattered findings of chronic pulmonary embolus. This includes in the left pulmonary artery on image 185 where there are peripheral  string like filling defects; the peripheral chronic filling defect along the margin of the right lower lobe pulmonary artery on image 213 series 7; an adjacent web-like filling defect in a segmental branch of the right lower lobe pulmonary artery on image 212 of series 7; and a web like filling defect at the origin of a left lower lobe  segmental branch on image 260 series 7. These all have characteristic appearance of chronic embolus, and I do not discern current findings of acute pulmonary embolus or substantial worsening of appearance compared to prior exams. Mild prominence the main pulmonary artery at 3.7 cm diameter, potentially reflecting pulmonary arterial hypertension. Coronary, aortic arch, and branch vessel atherosclerotic vascular disease. Mediastinum/Nodes: No pathologic adenopathy. Lungs/Pleura: Right upper lobectomy. Stable radiation therapy related findings in the right perihilar region. Improved airspace opacity around peripheral cavitary process in the right lower lobe, currently the cavity or air cyst measures 1.4 by 1.4 cm on image 86 of series 6 and has wall thickness of about 1-2 mm. Similarly mild wall thickening associated with a second bulla or small cavity more medially on image 87 series 6. Patchy peripheral mainly tree-in-bud reticulonodular opacities in the posterior basal segment right lower lobe, images 95 through 135 series 6, favoring atypical infection. Airway thickening and some airway plugging in the right lower lobe. Mosaic attenuation in both lungs. Underlying emphysema. Mild tree-in-bud reticulonodular opacities increased in the posterior basal segment left lower lobe and in the lingula. Upper Abdomen: Unremarkable Musculoskeletal: Unremarkable Review of the MIP images confirms the above findings. IMPRESSION: 1. Bilateral chronic pulmonary embolus but no acute pulmonary embolus. 2. Probable pulmonary arterial hypertension. 3.  Aortic Atherosclerosis (ICD10-I70.0).  Coronary atherosclerosis. 4. Improved airspace opacity along the peripheral cavitary process in the right lower lobe, currently the thin walled air cyst or cavity measures about 1.4 cm in diameter. 5. Increased peripheral tree-in-bud reticulonodular opacities in both lower lobes in the lingula, compatible with atypical infection. 6. Airway  thickening and airway plugging particularly in the right lower lobe. 7. Mosaic attenuation in the lungs, stable but nonspecific. 8.  Emphysema (ICD10-J43.9). 9. Stable radiation therapy related findings in the right perihilar regions. Right upper lobectomy. Electronically Signed   By: Van Clines M.D.   On: 02/01/2022 17:21   DG Chest 2 View  Result Date: 02/01/2022 CLINICAL DATA:  Shortness of breath EXAM: CHEST - 2 VIEW COMPARISON:  Previous studies including examination of February 07, 2022 FINDINGS: Cardiac size is within normal limits. There is decreased volume right lung. Linear densities are seen in right parahilar region and right lower lung field with no significant change suggesting scarring. Blunting of right lateral CP angle may suggest pleural thickening. There are no new infiltrates or signs of pulmonary edema. There is no pneumothorax. Tip of chest port is seen in the course of the superior vena cava. IMPRESSION: There are no new infiltrates or signs of pulmonary edema. Electronically Signed   By: Elmer Picker M.D.   On: 02/01/2022 12:37            LOS: 3 days    Time spent: 35 min     Emeterio Reeve, DO [facility] 02/04/2022, 1:32 PM   Staff may message me via secure chat in [place]  but this may not receive immediate response,  please page for urgent matters!  If 7PM-7AM, please contact night-coverage [email or web address]  Dictation software was used to generate the above note. Typos may occur and escape review, as with typed/written notes. Please contact  Dr Sheppard Coil directly for clarity if needed.

## 2022-02-05 DIAGNOSIS — J189 Pneumonia, unspecified organism: Secondary | ICD-10-CM | POA: Diagnosis not present

## 2022-02-05 LAB — GLUCOSE, CAPILLARY
Glucose-Capillary: 112 mg/dL — ABNORMAL HIGH (ref 70–99)
Glucose-Capillary: 113 mg/dL — ABNORMAL HIGH (ref 70–99)
Glucose-Capillary: 138 mg/dL — ABNORMAL HIGH (ref 70–99)

## 2022-02-05 LAB — CBC
HCT: 24.1 % — ABNORMAL LOW (ref 39.0–52.0)
Hemoglobin: 8.6 g/dL — ABNORMAL LOW (ref 13.0–17.0)
MCH: 35.4 pg — ABNORMAL HIGH (ref 26.0–34.0)
MCHC: 35.7 g/dL (ref 30.0–36.0)
MCV: 99.2 fL (ref 80.0–100.0)
Platelets: 48 10*3/uL — ABNORMAL LOW (ref 150–400)
RBC: 2.43 MIL/uL — ABNORMAL LOW (ref 4.22–5.81)
RDW: 15.2 % (ref 11.5–15.5)
WBC: 74.1 10*3/uL (ref 4.0–10.5)
nRBC: 1.2 % — ABNORMAL HIGH (ref 0.0–0.2)

## 2022-02-05 LAB — BASIC METABOLIC PANEL
Anion gap: 6 (ref 5–15)
BUN: 33 mg/dL — ABNORMAL HIGH (ref 6–20)
CO2: 21 mmol/L — ABNORMAL LOW (ref 22–32)
Calcium: 9 mg/dL (ref 8.9–10.3)
Chloride: 110 mmol/L (ref 98–111)
Creatinine, Ser: 0.9 mg/dL (ref 0.61–1.24)
GFR, Estimated: >60 mL/min (ref >60)
Glucose, Bld: 108 mg/dL — ABNORMAL HIGH (ref 70–99)
Potassium: 4.4 mmol/L (ref 3.5–5.1)
Sodium: 137 mmol/L (ref 135–145)

## 2022-02-05 LAB — MAGNESIUM: Magnesium: 2 mg/dL (ref 1.7–2.4)

## 2022-02-05 MED ORDER — ENSURE ENLIVE PO LIQD: 237.0000 mL | Freq: Three times a day (TID) | ORAL | 12 refills | Status: AC

## 2022-02-05 MED ORDER — PREDNISONE 10 MG PO TABS: ORAL_TABLET | ORAL | 0 refills | Status: DC

## 2022-02-05 MED ORDER — HEPARIN SOD (PORK) LOCK FLUSH 100 UNIT/ML IV SOLN: 500.0000 [IU] | INTRAVENOUS | Status: DC | PRN

## 2022-02-05 MED ORDER — AMOXICILLIN-POT CLAVULANATE 500-125 MG PO TABS: 1.0000 | ORAL_TABLET | Freq: Two times a day (BID) | ORAL | 0 refills | Status: DC

## 2022-02-05 MED ORDER — ADULT MULTIVITAMIN W/MINERALS CH: 1.0000 | ORAL_TABLET | Freq: Every day | ORAL | Status: AC

## 2022-02-05 MED ORDER — DOXYCYCLINE HYCLATE 100 MG PO TABS: 100.0000 mg | ORAL_TABLET | Freq: Two times a day (BID) | ORAL | 0 refills | Status: DC

## 2022-02-05 NOTE — Discharge Summary (Signed)
Physician Discharge Summary   Patient: Samuel Choi MRN: [id number]  DOB: 1965-10-02   Admit:     Date of Admission: 02/01/2022 Admitted from: home   Discharge: Date of discharge: 02/05/22 Disposition: Home Condition at discharge: good  CODE STATUS: FULL     Discharge Physician: Emeterio Reeve, DO [facility]     PCP: Earlie Server, MD  Recommendations for Outpatient Follow-up:  Follow up with PCP Earlie Server, MD in 1 week their office will call for appt  Please obtain labs/tests: CBC/DIff, CMP in 1 week Please follow up on the following pending results: none Please arrange outpatient cardiology followup to eval/treat possible pulmonary HTN noted on Echo.  Likely D/t PE. Recommend follow up with cardiology outpatient vs repeat echo   Discharge Instructions     Diet general   Complete by: As directed    Discharge instructions   Complete by: As directed    Finish antibiotics: dose tonight then twice daily for 3 more days  Oncology office should be calling you to set up a follow-up, if you don't hear from them tomorrow call them on  Tuesday to schedule.   Any worsening shortness of breath, chest pain, or other concerns please seek medical care ASAP!   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 difficile, PE on Eliquis, dCHF, presents to ED 02/01/22 w/ SOB. Of note: Had visit w/ Onc as well 07/25 at which point he decliend transfer to ED, started on levaquin, antitussives, steroids, (+)neutropenia w/o fever. Had another office visit w/ Onc palliative care 07/26 and as sent to ED d/t SOB.  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 Meeting Sepsis  criteria: tachycardia, tachypnea. Had abn WBC count day prior (1.5 though complicated by steroids/chemo effect). CTA negative for acute new PE. Started vanco and cefepime. Procal elevated. Given CHF Hx (no exacerbation at time of admission), less aggressive IV fluid resuscitation. IV steroids. Has 2L of new O2 requirement. Held home BP meds. Fluid restricted + NaCl tabs to treat hyponatremia 07/27: still on 2L [place]. Sodium improving. WBC increased (possible steroid effect) to 19. Tachycardia/tachypnea has resolved as of this morning = not meeting SIRS/Sepsis criteria. Then developed SVT treated w/ adenosine 6 mg then 12 mg, cardiology consulted. (Of note, previous Holter in 2020 showed PSVT he was started on Cardizem CD 120 mg daily and eventually metoprolol XL 25 mg once daily). Cardio recs: paroxysmal SVT likely C/W atrial flutter - started metoprolol tartrate 12.5 mg p.o. 3 times daily as his blood pressure and heart rate allow, goal magnesium >2, potassium >4  07/28: marked elevation in WBC likely reactive, VS have remained stable including BP on new beta blocker regimen. BCx NGx2d, sputum Cx pending.  07/29: WBC increased further, elevated Neut ct, VSS, SpO2 92% on RA since early this morning. Echo pending read as of this morning. MRSA neg, d/c Vanc and if doing well through tonight should be stable for discharge tomorrow. Confirmed w/ Onc no need to be overly concerned about WBC, likely d/t recent treatment and their office will call to set up earlier follow-up.  07/30: VS remain stable, will finish IV abx this morning then ok  for discharge on po antibiotics, prednisone taper, home inhalers, close follow up with oncology. Echo resulted: LVEF 50-55 w/ LV low normal fxn, mild LVH, G1 DD. Elevated pulmonary artery pressure. Likely D/t PE. Recommend follow up with cardiology outpatient vs repeat echo   Consultants:  Cardiology   Procedures: none     Discharge Diagnoses: Principal Problem:   HCAP  (healthcare-associated pneumonia) Active Problems:   COPD exacerbation ([place])   Sepsis ([place])   Squamous cell carcinoma of right lung ([place])   Essential hypertension   Pulmonary embolus (HCC)   Hypokalemia   Hypomagnesemia   Hyponatremia   Severe protein-calorie malnutrition (HCC)   Thrombocytopenia (HCC)   Chronic diastolic CHF (congestive heart failure) (HCC)   Macrocytic anemia   SVT (supraventricular tachycardia) (HCC)    Assessment & Plan:  HCAP (healthcare-associated pneumonia) Sepsis due to HCAP and COPD exacerbation: pt meets criteria for sepsis with heart rate 120, RR 23.   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 NEG COVID, strep/legionella, MRA screen IV Vancomycin and cefepime, added Doxy to cover atypicals given CT findings --> d/c Vanc 02/04/22 --> augmentin/doxy po in discharge to complete 7 day course Prednisone and home inhalers incentive spirometer  ER precautions reviewed.   COPD exacerbation ([place]) see above  Sepsis ([place]) see above  Squamous cell carcinoma of right lung ([place]) S/p of surgery, currently on chemo and immunotherapy with ramucirumab.  Last dose was 7/19 Follow-up with Dr. Tasia Catchings of oncology  Essential hypertension Blood pressure is soft due to sepsis Initially held home blood pressure medications  transition to metoprolol and Cardizem home medications 07/29  Pulmonary embolus ([place]) CT 07/26: Bilateral chronic pulmonary embolus but no acute pulmonary embolus Continue Eliquis PUlm HTN on Echo - may be d/t PE but may need cardio f/u   Hypokalemia Monitor BMP Replete as needed  Hyponatremia Likely due to poor oral intake Sodium improved with fluid restriction and 500 mL normal saline Monitor BMP  Hypomagnesemia Follow outpatient  Severe protein-calorie malnutrition (HCC) Body weight 58.1 kg, BMI 16.90 Nutrition consult Ensure  Thrombocytopenia (HCC) Platelet 56 --> 30, no active bleeding.   Likely due to  chemotherapy Follow-up with CBC Cardiology recommends continue Eliquis, will restart this  Chronic diastolic CHF (congestive heart failure) ([place]) 2D echo 01/01/2021 showed EF of 60 to 65% with grade 1 diastolic dysfunction.  Patient has elevated BNP at 920, but no leg edema or JVD.  Chest x-ray negative for infiltration.  Does not seem to have CHF exacerbation Watch volume status closely  Macrocytic anemia Leukocytosis Chemo effect FOllow outpatient  SVT (supraventricular tachycardia) (HCC) Likely d/t acute illness, concern for stress d/t chronic PE  Episode 02/02/22 see note Converted after 2nd dose (12 mg) adenosine  Outpatient record review:  Last seen outpatient 12/2021 Dr. Ubaldo Glassing d/t tachycardia (referred by oncology), underwent a Holter monitor revealing an average heart rate of just over 100, continued diltiazem CD 120 mg daily.  Echo last year 12/2020 for VTach, LVEF 60-65, normal LV fxn, Grade 1 diastolic dysfunction.  Repeat echo as above  Per cardiology recommendations continue metoprolol tartrate 12.5 mg p.o. 3 times daily --> once blood pressure allows would restart home metoprolol XL 25 mg daily and Cardizem CD 120 mg daily --> restarted these 02/04/22  Maintain magnesium greater than 2, potassium greater than 4 No further cardiac diagnostics at this time, signing off as of 02/03/2022      Discharge Instructions  Allergies as of 02/05/2022   No Known Allergies  Medication List     STOP taking these medications    levofloxacin 750 MG tablet Commonly known as: Levaquin   loperamide 2 MG capsule Commonly known as: IMODIUM   nystatin 100000 UNIT/ML suspension Commonly known as: MYCOSTATIN   olmesartan 40 MG tablet Commonly known as: BENICAR   promethazine 25 MG tablet Commonly known as: PHENERGAN       TAKE these medications    albuterol 108 (90 Base) MCG/ACT inhaler Commonly known as: VENTOLIN HFA INHALE 2 PUFFS BY MOUTH EVERY 6 HOURS AS  NEEDED FOR WHEEZE OR SHORTNESS OF BREATH   amoxicillin-clavulanate 500-125 MG tablet Commonly known as: AUGMENTIN Take 1 tablet (500 mg total) by mouth 2 (two) times daily. Starting in evening 02/05/22   benzonatate 100 MG capsule Commonly known as: TESSALON Take 1 capsule (100 mg total) by mouth 3 (three) times daily as needed for cough.   budesonide-formoterol 80-4.5 MCG/ACT inhaler Commonly known as: Symbicort Inhale 2 puffs into the lungs 2 (two) times daily. in the morning and at bedtime.   cyanocobalamin 1000 MCG tablet Commonly known as: VITAMIN B12 Take 1 tablet (1,000 mcg total) by mouth daily.   diltiazem 120 MG 24 hr capsule Commonly known as: CARDIZEM CD Take 120 mg by mouth daily.   doxycycline 100 MG tablet Commonly known as: VIBRA-TABS Take 1 tablet (100 mg total) by mouth 2 (two) times daily. Starting in evening 02/05/22   Eliquis 2.5 MG Tabs tablet Generic drug: apixaban TAKE 1 TABLET(2.5 MG) BY MOUTH TWICE DAILY What changed: See the new instructions.   feeding supplement Liqd Take 237 mLs by mouth 3 (three) times daily between meals.   HYDROcodone-acetaminophen 5-325 MG tablet Commonly known as: NORCO/VICODIN Take 1 tablet by mouth every 8 (eight) hours as needed for severe pain.   latanoprost 0.005 % ophthalmic solution Commonly known as: XALATAN Place 1 drop into both eyes at bedtime.   loratadine 10 MG tablet Commonly known as: CLARITIN Take 10 mg by mouth daily.   methocarbamol 500 MG tablet Commonly known as: ROBAXIN Take 1 tablet (500 mg total) by mouth at bedtime.   metoprolol succinate 25 MG 24 hr tablet Commonly known as: TOPROL-XL Take 1 tablet (25 mg total) by mouth daily.   multivitamin with minerals Tabs tablet Take 1 tablet by mouth daily.   ondansetron 8 MG tablet Commonly known as: Zofran Take 1 tablet (8 mg total) by mouth every 8 (eight) hours as needed for refractory nausea / vomiting. Start on day 3 after chemo.    pantoprazole 20 MG tablet Commonly known as: PROTONIX TAKE 1 TABLET(20 MG) BY MOUTH DAILY   potassium chloride 10 MEQ tablet Commonly known as: KLOR-CON Take 10 mEq by mouth daily. Start taking 01/14/22   predniSONE 10 MG tablet Commonly known as: DELTASONE Take 5 tablets (50 mg total) by mouth daily for 1 day, THEN 4 tablets (40 mg total) daily for 1 day, THEN 3 tablets (30 mg total) daily for 1 day, THEN 2 tablets (20 mg total) daily for 1 day, THEN 1 tablet (10 mg total) daily for 1 day. Start taking on: February 05, 2022 What changed:  medication strength See the new instructions.   Spiriva HandiHaler 18 MCG inhalation capsule Generic drug: tiotropium INHALE 1 CAPSULE VIA HANDIHALER ONCE DAILY AT THE SAME TIME EVERY DAY          No Known Allergies   Subjective: Pt feels good this morning, off O2, no SOB, no cough. WOUld like  to go home if possible   Discharge Exam: Vitals:   02/05/22 0500 02/05/22 0800  BP: 128/90 133/88  Pulse: 95 87  Resp: 20 19  Temp: 98.1 F (36.7 C) 97.9 F (36.6 C)  SpO2: 91% 100%   General: Pt is alert, awake, not in acute distress Cardiovascular: RRR, S1/S2 +, no rubs, no gallops Respiratory: CTA bilaterally, faint wheezing, no rhonchi Abdominal: Soft, NT, ND, bowel sounds + Extremities: no edema, no cyanosis     The results of significant diagnostics from this hospitalization (including imaging, microbiology, ancillary and laboratory) are listed below for reference.     Microbiology: Recent Results (from the past 240 hour(s))  Blood culture (routine x 2)     Status: None (Preliminary result)   Collection Time: 02/01/22  5:39 PM   Specimen: BLOOD RIGHT FOREARM  Result Value Ref Range Status   Specimen Description BLOOD RIGHT FOREARM  Final   Special Requests   Final    BOTTLES DRAWN AEROBIC AND ANAEROBIC Blood Culture results may not be optimal due to an inadequate volume of blood received in culture bottles   Culture   Final     NO GROWTH 4 DAYS Performed at [facility], [address] [place], [place] [address]    Report Status PENDING  Incomplete  Blood culture (routine x 2)     Status: None (Preliminary result)   Collection Time: 02/01/22  5:44 PM   Specimen: BLOOD RIGHT HAND  Result Value Ref Range Status   Specimen Description BLOOD RIGHT HAND  Final   Special Requests   Final    BOTTLES DRAWN AEROBIC AND ANAEROBIC Blood Culture adequate volume   Culture   Final    NO GROWTH 4 DAYS Performed at [facility], [address], [place], [place] [address]    Report Status PENDING  Incomplete  SARS Coronavirus 2 by RT PCR (hospital order, performed in [place] hospital lab) *cepheid single result test* Anterior Nasal Swab     Status: None   Collection Time: 02/01/22  6:50 PM   Specimen: Anterior Nasal Swab  Result Value Ref Range Status   SARS Coronavirus 2 by RT PCR NEGATIVE NEGATIVE Final    Comment: (NOTE) SARS-CoV-2 target nucleic acids are NOT DETECTED.  The SARS-CoV-2 RNA is generally detectable in upper and lower respiratory specimens during the acute phase of infection. The lowest concentration of SARS-CoV-2 viral copies this assay can detect is 250 copies / mL. A negative result does not preclude SARS-CoV-2 infection and should not be used as the sole basis for treatment or other patient management decisions.  A negative result may occur with improper specimen collection / handling, submission of specimen other than nasopharyngeal swab, presence of viral mutation(s) within the areas targeted by this assay, and inadequate number of viral copies (<250 copies / mL). A negative result must be combined with clinical observations, patient history, and epidemiological information.  Fact Sheet for Patients:   https://www.patel.info/  Fact Sheet for Healthcare Providers: https://hall.com/  This test is not yet approved or  cleared by  the [place] FDA and has been authorized for detection and/or diagnosis of SARS-CoV-2 by FDA under an Emergency Use Authorization (EUA).  This EUA will remain in effect (meaning this test can be used) for the duration of the COVID-19 declaration under Section 564(b)(1) of the Act, 21 U.S.C. section 360bbb-3(b)(1), unless the authorization is terminated or revoked sooner.  Performed at [facility], [address] [place], [place] [address]   MRSA Next Gen by PCR, Nasal     Status: None   Collection Time: 02/03/22 12:31 PM   Specimen: Nasal Mucosa; Nasal Swab  Result Value Ref Range Status   MRSA by PCR Next Gen NOT DETECTED NOT DETECTED Final    Comment: (NOTE) The GeneXpert MRSA Assay (FDA approved for NASAL specimens only), is one component of a comprehensive MRSA colonization surveillance program. It is not intended to diagnose MRSA infection nor to guide or monitor treatment for MRSA infections. Test performance is not FDA approved in patients less than 69 years old. Performed at [facility], [address] [place], [place] [address]      Labs: BNP (last 3 results) Recent Labs    02/01/22 1040  BNP 703.5*   Basic Metabolic Panel: Recent Labs  Lab 02/01/22 1040 02/01/22 1815 02/02/22 0143 02/02/22 1250 02/02/22 1941 02/03/22 0500 02/03/22 0527 02/04/22 0500 02/05/22 0500  NA 129* 131* 132* 133* 132*  --  133* 136 137  K 3.4* 3.8 4.5 4.0 4.3  --  3.9 4.1 4.4  CL 97* 99 102 103 104  --  106 110 110  CO2 21* 17* 17* 19* 17*  --  20* 20* 21*  GLUCOSE 62* 42* 91 222* 178*  --  153* 134* 108*  BUN 21* 19 18 22* 27*  --  26* 32* 33*  CREATININE 1.06 1.02 1.06 1.13 1.17  --  1.11 1.07 0.90  CALCIUM 7.7* 7.8* 7.9* 8.2* 8.3*  --  8.2* 8.9 9.0  MG 1.1*  --  2.4  --   --  2.0  --   --  2.0  PHOS  --  2.3*  --   --   --   --   --   --   --    Liver Function Tests: Recent Labs  Lab 01/31/22 1323  AST 15  ALT 10  ALKPHOS 76  BILITOT 1.2   PROT 6.4*  ALBUMIN 2.8*   No results for input(s): "LIPASE", "AMYLASE" in the last 168 hours. No results for input(s): "AMMONIA" in the last 168 hours. CBC: Recent Labs  Lab 01/31/22 1323 02/01/22 1040 02/02/22 0143 02/03/22 0527 02/04/22 0500 02/05/22 0500  WBC 1.5* 7.6 19.4* 48.9* 63.0* 74.1*  NEUTROABS 0.6* 6.0  --   --  41.2*  --   HGB 11.8* 10.7* 10.4* 8.8* 8.7* 8.6*  HCT 33.2* 30.3* 30.6* 24.5* 24.6* 24.1*  MCV 101.8* 100.7* 103.4* 98.0 99.6 99.2  PLT 78* 56* 38* 30* 39* 48*   Cardiac Enzymes: No results for input(s): "CKTOTAL", "CKMB", "CKMBINDEX", "TROPONINI" in the last 168 hours. BNP: Invalid input(s): "POCBNP" CBG: Recent Labs  Lab 02/04/22 1639 02/04/22 2012 02/05/22 0004 02/05/22 0550 02/05/22 0802  GLUCAP 139* 115* 138* 113* 112*   D-Dimer No results for input(s): "DDIMER" in the last 72 hours. Hgb A1c No results for input(s): "HGBA1C" in the last 72 hours. Lipid Profile No results for input(s): "CHOL", "HDL", "LDLCALC", "TRIG", "CHOLHDL", "LDLDIRECT" in the last 72 hours. Thyroid function studies Recent Labs    02/03/22 0527  TSH 1.460  T4TOTAL 4.2*   Anemia work up No results for input(s): "VITAMINB12", "FOLATE", "FERRITIN", "TIBC", "IRON", "RETICCTPCT" in the last 72 hours. Urinalysis    Component Value Date/Time   COLORURINE Yellow 11/02/2014 1900   APPEARANCEUR Hazy 11/02/2014 1900   LABSPEC 1.013 11/02/2014 1900   PHURINE 9.0 11/02/2014 1900   GLUCOSEU Negative 11/02/2014 1900  HGBUR 1+ 11/02/2014 1900   BILIRUBINUR Negative 11/02/2014 1900   KETONESUR Negative 11/02/2014 1900   PROTEINUR 30 mg/dL 11/02/2014 1900   NITRITE Negative 11/02/2014 1900   LEUKOCYTESUR 3+ 11/02/2014 1900   Sepsis Labs Recent Labs  Lab 02/02/22 0143 02/03/22 0527 02/04/22 0500 02/05/22 0500  WBC 19.4* 48.9* 63.0* 74.1*   Microbiology Recent Results (from the past 240 hour(s))  Blood culture (routine x 2)     Status: None (Preliminary result)    Collection Time: 02/01/22  5:39 PM   Specimen: BLOOD RIGHT FOREARM  Result Value Ref Range Status   Specimen Description BLOOD RIGHT FOREARM  Final   Special Requests   Final    BOTTLES DRAWN AEROBIC AND ANAEROBIC Blood Culture results may not be optimal due to an inadequate volume of blood received in culture bottles   Culture   Final    NO GROWTH 4 DAYS Performed at [facility], [address], [place], [place] [address]    Report Status PENDING  Incomplete  Blood culture (routine x 2)     Status: None (Preliminary result)   Collection Time: 02/01/22  5:44 PM   Specimen: BLOOD RIGHT HAND  Result Value Ref Range Status   Specimen Description BLOOD RIGHT HAND  Final   Special Requests   Final    BOTTLES DRAWN AEROBIC AND ANAEROBIC Blood Culture adequate volume   Culture   Final    NO GROWTH 4 DAYS Performed at [facility], [address], [place], [place] [address]    Report Status PENDING  Incomplete  SARS Coronavirus 2 by RT PCR (hospital order, performed in [place] hospital lab) *cepheid single result test* Anterior Nasal Swab     Status: None   Collection Time: 02/01/22  6:50 PM   Specimen: Anterior Nasal Swab  Result Value Ref Range Status   SARS Coronavirus 2 by RT PCR NEGATIVE NEGATIVE Final    Comment: (NOTE) SARS-CoV-2 target nucleic acids are NOT DETECTED.  The SARS-CoV-2 RNA is generally detectable in upper and lower respiratory specimens during the acute phase of infection. The lowest concentration of SARS-CoV-2 viral copies this assay can detect is 250 copies / mL. A negative result does not preclude SARS-CoV-2 infection and should not be used as the sole basis for treatment or other patient management decisions.  A negative result may occur with improper specimen collection / handling, submission of specimen other than nasopharyngeal swab, presence of viral mutation(s) within the areas targeted by this assay, and inadequate number of  viral copies (<250 copies / mL). A negative result must be combined with clinical observations, patient history, and epidemiological information.  Fact Sheet for Patients:   https://www.patel.info/  Fact Sheet for Healthcare Providers: https://hall.com/  This test is not yet approved or  cleared by the [place] FDA and has been authorized for detection and/or diagnosis of SARS-CoV-2 by FDA under an Emergency Use Authorization (EUA).  This EUA will remain in effect (meaning this test can be used) for the duration of the COVID-19 declaration under Section 564(b)(1) of the Act, 21 U.S.C. section 360bbb-3(b)(1), unless the authorization is terminated or revoked sooner.  Performed at [facility], [address] [place], [place] [address]   MRSA Next Gen by PCR, Nasal     Status: None   Collection Time: 02/03/22 12:31 PM   Specimen: Nasal Mucosa; Nasal Swab  Result Value Ref Range Status   MRSA by PCR Next Gen NOT DETECTED NOT  DETECTED Final    Comment: (NOTE) The GeneXpert MRSA Assay (FDA approved for NASAL specimens only), is one component of a comprehensive MRSA colonization surveillance program. It is not intended to diagnose MRSA infection nor to guide or monitor treatment for MRSA infections. Test performance is not FDA approved in patients less than 66 years old. Performed at [facility], [address], [place], [place] [address]    Imaging ECHOCARDIOGRAM COMPLETE  Result Date: 02/04/2022    ECHOCARDIOGRAM REPORT   Patient Name:   Samuel Choi Date of Exam: 02/03/2022 Medical Rec #:  [id number]      Height:       73.0 in Accession #:    [id number]     Weight:       128.1 lb Date of Birth:  Feb 13, 1966       BSA:          1.780 m Patient Age:    56 years       BP:           123/87 mmHg Patient Gender: M              HR:           98 bpm. Exam Location:  [facility] Procedure: 2D Echo, Cardiac Doppler and Color  Doppler Indications:     SVT (supraventricular tachycardia) ([place]) [[id number]]                  Pulmonary embolism ([place]) [[id number]]  History:         Patient has prior history of Echocardiogram examinations, most                  recent 01/01/2021. Risk Factors:Hypertension and ETOH.  Sonographer:     Bernadene Person RDCS Referring Phys:  [id number] Alanson Puls TANG Diagnosing Phys: Isaias Cowman MD  Sonographer Comments: Image acquisition challenging due to respiratory motion. IMPRESSIONS  1. Left ventricular ejection fraction, by estimation, is 50 to 55%. The left ventricle has low normal function. The left ventricle has no regional wall motion abnormalities. There is mild left ventricular hypertrophy. Left ventricular diastolic parameters are consistent with Grade I diastolic dysfunction (impaired relaxation).  2. Right ventricular systolic function is normal. The right ventricular size is mildly enlarged. There is severely elevated pulmonary artery systolic pressure.  3. Right atrial size was moderately dilated.  4. The mitral valve is normal in structure. No evidence of mitral valve regurgitation. No evidence of mitral stenosis.  5. Tricuspid valve regurgitation is moderate to severe.  6. The aortic valve is normal in structure. Aortic valve regurgitation is not visualized. No aortic stenosis is present.  7. The inferior vena cava is normal in size with greater than 50% respiratory variability, suggesting right atrial pressure of 3 mmHg. FINDINGS  Left Ventricle: Left ventricular ejection fraction, by estimation, is 50 to 55%. The left ventricle has low normal function. The left ventricle has no regional wall motion abnormalities. The left ventricular internal cavity size was normal in size. There is mild left ventricular hypertrophy. Left ventricular diastolic parameters are consistent with Grade I diastolic dysfunction (impaired relaxation). Right Ventricle: The right ventricular size is mildly enlarged. No  increase in right ventricular wall thickness. Right ventricular systolic function is normal. There is severely elevated pulmonary artery systolic pressure. The tricuspid regurgitant velocity is 4.41 m/s, and with an assumed right atrial pressure of 3 mmHg, the estimated right ventricular systolic pressure is 23.5 mmHg. Left Atrium: Left atrial  size was normal in size. Right Atrium: Right atrial size was moderately dilated. Pericardium: There is no evidence of pericardial effusion. Mitral Valve: The mitral valve is normal in structure. No evidence of mitral valve regurgitation. No evidence of mitral valve stenosis. Tricuspid Valve: The tricuspid valve is normal in structure. Tricuspid valve regurgitation is moderate to severe. No evidence of tricuspid stenosis. Aortic Valve: The aortic valve is normal in structure. Aortic valve regurgitation is not visualized. No aortic stenosis is present. Pulmonic Valve: The pulmonic valve was normal in structure. Pulmonic valve regurgitation is not visualized. No evidence of pulmonic stenosis. Aorta: The aortic root is normal in size and structure. Venous: The inferior vena cava is normal in size with greater than 50% respiratory variability, suggesting right atrial pressure of 3 mmHg. IAS/Shunts: No atrial level shunt detected by color flow Doppler.  LEFT VENTRICLE PLAX 2D LVIDd:         4.00 cm   Diastology LVIDs:         2.95 cm   LV e' medial:    6.92 cm/s LV PW:         1.07 cm   LV E/e' medial:  9.8 LV IVS:        1.10 cm   LV e' lateral:   7.10 cm/s LVOT diam:     2.10 cm   LV E/e' lateral: 9.5 LV SV:         55 LV SV Index:   31 LVOT Area:     3.46 cm  RIGHT VENTRICLE RV S prime:     12.20 cm/s TAPSE (M-mode): 1.8 cm LEFT ATRIUM           Index        RIGHT ATRIUM           Index LA diam:      3.50 cm 1.97 cm/m   RA Area:     24.60 cm LA Vol (A2C): 30.6 ml 17.19 ml/m  RA Volume:   90.80 ml  51.00 ml/m LA Vol (A4C): 26.1 ml 14.66 ml/m  AORTIC VALVE LVOT Vmax:   115.00  cm/s LVOT Vmean:  73.900 cm/s LVOT VTI:    0.158 m  AORTA Ao Root diam: 3.30 cm Ao Asc diam:  3.20 cm MITRAL VALVE               TRICUSPID VALVE MV Area (PHT): 4.19 cm    TR Peak grad:   77.8 mmHg MV Decel Time: 181 msec    TR Vmax:        441.00 cm/s MV E velocity: 67.70 cm/s MV A velocity: 78.00 cm/s  SHUNTS MV E/A ratio:  0.87        Systemic VTI:  0.16 m                            Systemic Diam: 2.10 cm Isaias Cowman MD Electronically signed by Isaias Cowman MD Signature Date/Time: 02/04/2022/2:59:37 PM    Final       Time coordinating discharge: Over 30 minutes  SIGNED:  Emeterio Reeve DO [facility]

## 2022-02-05 NOTE — Plan of Care (Signed)
  Problem: Education: Goal: Knowledge of disease or condition will improve Outcome: Adequate for Discharge Goal: Knowledge of the prescribed therapeutic regimen will improve Outcome: Adequate for Discharge Goal: Individualized Educational Video(s) Outcome: Adequate for Discharge   Problem: Activity: Goal: Ability to tolerate increased activity will improve Outcome: Adequate for Discharge Goal: Will verbalize the importance of balancing activity with adequate rest periods Outcome: Adequate for Discharge   Problem: Respiratory: Goal: Ability to maintain a clear airway will improve Outcome: Adequate for Discharge Goal: Levels of oxygenation will improve Outcome: Adequate for Discharge Goal: Ability to maintain adequate ventilation will improve Outcome: Adequate for Discharge   Problem: Activity: Goal: Ability to tolerate increased activity will improve Outcome: Adequate for Discharge   Problem: Clinical Measurements: Goal: Ability to maintain a body temperature in the normal range will improve Outcome: Adequate for Discharge   Problem: Respiratory: Goal: Ability to maintain adequate ventilation will improve Outcome: Adequate for Discharge Goal: Ability to maintain a clear airway will improve Outcome: Adequate for Discharge   Problem: Health Behavior/Discharge Planning: Goal: Ability to manage health-related needs will improve Outcome: Adequate for Discharge   Problem: Clinical Measurements: Goal: Ability to maintain clinical measurements within normal limits will improve Outcome: Adequate for Discharge Goal: Will remain free from infection Outcome: Adequate for Discharge Goal: Diagnostic test results will improve Outcome: Adequate for Discharge Goal: Respiratory complications will improve Outcome: Adequate for Discharge Goal: Cardiovascular complication will be avoided Outcome: Adequate for Discharge   Problem: Activity: Goal: Risk for activity intolerance will  decrease Outcome: Adequate for Discharge   Problem: Nutrition: Goal: Adequate nutrition will be maintained Outcome: Adequate for Discharge   Problem: Coping: Goal: Level of anxiety will decrease Outcome: Adequate for Discharge   Problem: Elimination: Goal: Will not experience complications related to bowel motility Outcome: Adequate for Discharge Goal: Will not experience complications related to urinary retention Outcome: Adequate for Discharge   Problem: Pain Managment: Goal: General experience of comfort will improve Outcome: Adequate for Discharge   Problem: Safety: Goal: Ability to remain free from injury will improve Outcome: Adequate for Discharge   Problem: Skin Integrity: Goal: Risk for impaired skin integrity will decrease Outcome: Adequate for Discharge

## 2022-02-06 ENCOUNTER — Telehealth: Payer: Self-pay

## 2022-02-06 LAB — CULTURE, BLOOD (ROUTINE X 2)
Culture: NO GROWTH
Culture: NO GROWTH
Special Requests: ADEQUATE

## 2022-02-06 NOTE — Telephone Encounter (Signed)
-----   Message from Earlie Server, MD sent at 02/04/2022  1:23 PM EDT ----- He is going to be discharged over the weekend. Please schedule him to see NP as well on 8/4, lab infusion. Keep his appt with me on 8/9

## 2022-02-06 NOTE — Telephone Encounter (Signed)
Please schedule patient with NP on 8/4 with Lab infusion. Keep appt with Dr. Tasia Catchings on 8/9 as is. Thanks

## 2022-02-10 ENCOUNTER — Inpatient Hospital Stay: Payer: 59

## 2022-02-10 ENCOUNTER — Other Ambulatory Visit: Payer: Self-pay

## 2022-02-10 ENCOUNTER — Emergency Department: Payer: 59

## 2022-02-10 ENCOUNTER — Inpatient Hospital Stay
Admission: EM | Admit: 2022-02-10 | Discharge: 2022-02-12 | DRG: 189 | Disposition: A | Discharge status: Home / Self Care | Payer: 59 | Attending: Internal Medicine | Admitting: Internal Medicine

## 2022-02-10 ENCOUNTER — Encounter: Payer: Self-pay | Admitting: Medical Oncology

## 2022-02-10 ENCOUNTER — Encounter: Payer: Self-pay | Admitting: Emergency Medicine

## 2022-02-10 ENCOUNTER — Inpatient Hospital Stay (HOSPITAL_BASED_OUTPATIENT_CLINIC_OR_DEPARTMENT_OTHER): Payer: 59 | Admitting: Medical Oncology

## 2022-02-10 ENCOUNTER — Inpatient Hospital Stay: Payer: 59 | Attending: Oncology

## 2022-02-10 VITALS — BP 118/70 | HR 94 | Temp 98.7°F | Resp 16 | Wt 128.3 lb

## 2022-02-10 DIAGNOSIS — Z8249 Family history of ischemic heart disease and other diseases of the circulatory system: Secondary | ICD-10-CM | POA: Diagnosis not present

## 2022-02-10 DIAGNOSIS — R0902 Hypoxemia: Secondary | ICD-10-CM | POA: Insufficient documentation

## 2022-02-10 DIAGNOSIS — E876 Hypokalemia: Secondary | ICD-10-CM | POA: Diagnosis not present

## 2022-02-10 DIAGNOSIS — Z85118 Personal history of other malignant neoplasm of bronchus and lung: Secondary | ICD-10-CM | POA: Diagnosis not present

## 2022-02-10 DIAGNOSIS — J329 Chronic sinusitis, unspecified: Secondary | ICD-10-CM | POA: Diagnosis present

## 2022-02-10 DIAGNOSIS — D72829 Elevated white blood cell count, unspecified: Secondary | ICD-10-CM | POA: Diagnosis present

## 2022-02-10 DIAGNOSIS — Z681 Body mass index (BMI) 19 or less, adult: Secondary | ICD-10-CM

## 2022-02-10 DIAGNOSIS — Z7951 Long term (current) use of inhaled steroids: Secondary | ICD-10-CM

## 2022-02-10 DIAGNOSIS — I4510 Unspecified right bundle-branch block: Secondary | ICD-10-CM | POA: Diagnosis not present

## 2022-02-10 DIAGNOSIS — E43 Unspecified severe protein-calorie malnutrition: Secondary | ICD-10-CM | POA: Insufficient documentation

## 2022-02-10 DIAGNOSIS — D539 Nutritional anemia, unspecified: Secondary | ICD-10-CM | POA: Diagnosis not present

## 2022-02-10 DIAGNOSIS — T380X5A Adverse effect of glucocorticoids and synthetic analogues, initial encounter: Secondary | ICD-10-CM | POA: Diagnosis present

## 2022-02-10 DIAGNOSIS — Z803 Family history of malignant neoplasm of breast: Secondary | ICD-10-CM

## 2022-02-10 DIAGNOSIS — I471 Supraventricular tachycardia, unspecified: Secondary | ICD-10-CM | POA: Diagnosis present

## 2022-02-10 DIAGNOSIS — J449 Chronic obstructive pulmonary disease, unspecified: Secondary | ICD-10-CM | POA: Diagnosis present

## 2022-02-10 DIAGNOSIS — Z7901 Long term (current) use of anticoagulants: Secondary | ICD-10-CM | POA: Diagnosis not present

## 2022-02-10 DIAGNOSIS — Z7952 Long term (current) use of systemic steroids: Secondary | ICD-10-CM | POA: Diagnosis not present

## 2022-02-10 DIAGNOSIS — J189 Pneumonia, unspecified organism: Secondary | ICD-10-CM | POA: Diagnosis not present

## 2022-02-10 DIAGNOSIS — F1011 Alcohol abuse, in remission: Secondary | ICD-10-CM | POA: Diagnosis present

## 2022-02-10 DIAGNOSIS — E8809 Other disorders of plasma-protein metabolism, not elsewhere classified: Secondary | ICD-10-CM | POA: Insufficient documentation

## 2022-02-10 DIAGNOSIS — Z79899 Other long term (current) drug therapy: Secondary | ICD-10-CM | POA: Diagnosis not present

## 2022-02-10 DIAGNOSIS — I499 Cardiac arrhythmia, unspecified: Secondary | ICD-10-CM

## 2022-02-10 DIAGNOSIS — E872 Acidosis, unspecified: Secondary | ICD-10-CM | POA: Diagnosis present

## 2022-02-10 DIAGNOSIS — E46 Unspecified protein-calorie malnutrition: Secondary | ICD-10-CM | POA: Diagnosis present

## 2022-02-10 DIAGNOSIS — J439 Emphysema, unspecified: Secondary | ICD-10-CM | POA: Diagnosis present

## 2022-02-10 DIAGNOSIS — J9600 Acute respiratory failure, unspecified whether with hypoxia or hypercapnia: Secondary | ICD-10-CM | POA: Diagnosis present

## 2022-02-10 DIAGNOSIS — Z801 Family history of malignant neoplasm of trachea, bronchus and lung: Secondary | ICD-10-CM | POA: Diagnosis not present

## 2022-02-10 DIAGNOSIS — D6481 Anemia due to antineoplastic chemotherapy: Secondary | ICD-10-CM | POA: Diagnosis not present

## 2022-02-10 DIAGNOSIS — C3491 Malignant neoplasm of unspecified part of right bronchus or lung: Secondary | ICD-10-CM | POA: Diagnosis present

## 2022-02-10 DIAGNOSIS — C3411 Malignant neoplasm of upper lobe, right bronchus or lung: Secondary | ICD-10-CM | POA: Diagnosis present

## 2022-02-10 DIAGNOSIS — Z833 Family history of diabetes mellitus: Secondary | ICD-10-CM

## 2022-02-10 DIAGNOSIS — A319 Mycobacterial infection, unspecified: Secondary | ICD-10-CM | POA: Diagnosis present

## 2022-02-10 DIAGNOSIS — D696 Thrombocytopenia, unspecified: Secondary | ICD-10-CM | POA: Diagnosis present

## 2022-02-10 DIAGNOSIS — T451X5A Adverse effect of antineoplastic and immunosuppressive drugs, initial encounter: Secondary | ICD-10-CM | POA: Diagnosis not present

## 2022-02-10 DIAGNOSIS — J984 Other disorders of lung: Secondary | ICD-10-CM

## 2022-02-10 DIAGNOSIS — I10 Essential (primary) hypertension: Secondary | ICD-10-CM | POA: Diagnosis present

## 2022-02-10 DIAGNOSIS — J9601 Acute respiratory failure with hypoxia: Secondary | ICD-10-CM | POA: Diagnosis present

## 2022-02-10 DIAGNOSIS — Z86711 Personal history of pulmonary embolism: Secondary | ICD-10-CM | POA: Diagnosis not present

## 2022-02-10 DIAGNOSIS — J151 Pneumonia due to Pseudomonas: Secondary | ICD-10-CM | POA: Insufficient documentation

## 2022-02-10 DIAGNOSIS — Z87891 Personal history of nicotine dependence: Secondary | ICD-10-CM | POA: Diagnosis not present

## 2022-02-10 DIAGNOSIS — Z86718 Personal history of other venous thrombosis and embolism: Secondary | ICD-10-CM

## 2022-02-10 LAB — CBC WITH DIFFERENTIAL/PLATELET
Abs Immature Granulocytes: 0.63 10*3/uL — ABNORMAL HIGH (ref 0.00–0.07)
Abs Immature Granulocytes: 0.74 10*3/uL — ABNORMAL HIGH (ref 0.00–0.07)
Basophils Absolute: 0.1 10*3/uL (ref 0.0–0.1)
Basophils Absolute: 0.1 10*3/uL (ref 0.0–0.1)
Basophils Relative: 0 %
Basophils Relative: 0 %
Eosinophils Absolute: 0 10*3/uL (ref 0.0–0.5)
Eosinophils Absolute: 0 10*3/uL (ref 0.0–0.5)
Eosinophils Relative: 0 %
Eosinophils Relative: 0 %
HCT: 27 % — ABNORMAL LOW (ref 39.0–52.0)
HCT: 27.1 % — ABNORMAL LOW (ref 39.0–52.0)
Hemoglobin: 9.3 g/dL — ABNORMAL LOW (ref 13.0–17.0)
Hemoglobin: 9.4 g/dL — ABNORMAL LOW (ref 13.0–17.0)
Immature Granulocytes: 2 %
Immature Granulocytes: 2 %
Lymphocytes Relative: 3 %
Lymphocytes Relative: 1 %
Lymphs Abs: 1 10*3/uL (ref 0.7–4.0)
Lymphs Abs: 0.2 10*3/uL — ABNORMAL LOW (ref 0.7–4.0)
MCH: 35.9 pg — ABNORMAL HIGH (ref 26.0–34.0)
MCH: 36.6 pg — ABNORMAL HIGH (ref 26.0–34.0)
MCHC: 34.4 g/dL (ref 30.0–36.0)
MCHC: 34.7 g/dL (ref 30.0–36.0)
MCV: 104.2 fL — ABNORMAL HIGH (ref 80.0–100.0)
MCV: 105.4 fL — ABNORMAL HIGH (ref 80.0–100.0)
Monocytes Absolute: 0.8 10*3/uL (ref 0.1–1.0)
Monocytes Absolute: 0.6 10*3/uL (ref 0.1–1.0)
Monocytes Relative: 3 %
Monocytes Relative: 2 %
Neutro Abs: 28.1 10*3/uL — ABNORMAL HIGH (ref 1.7–7.7)
Neutro Abs: 33.2 10*3/uL — ABNORMAL HIGH (ref 1.7–7.7)
Neutrophils Relative %: 92 %
Neutrophils Relative %: 95 %
Platelets: 91 10*3/uL — ABNORMAL LOW (ref 150–400)
Platelets: 96 10*3/uL — ABNORMAL LOW (ref 150–400)
RBC: 2.59 MIL/uL — ABNORMAL LOW (ref 4.22–5.81)
RBC: 2.57 MIL/uL — ABNORMAL LOW (ref 4.22–5.81)
RDW: 17 % — ABNORMAL HIGH (ref 11.5–15.5)
RDW: 17.2 % — ABNORMAL HIGH (ref 11.5–15.5)
Smear Review: DECREASED
WBC: 30.6 10*3/uL — ABNORMAL HIGH (ref 4.0–10.5)
WBC: 34.8 10*3/uL — ABNORMAL HIGH (ref 4.0–10.5)
nRBC: 2.8 % — ABNORMAL HIGH (ref 0.0–0.2)
nRBC: 2 % — ABNORMAL HIGH (ref 0.0–0.2)

## 2022-02-10 LAB — COMPREHENSIVE METABOLIC PANEL
ALT: 21 U/L (ref 0–44)
AST: 31 U/L (ref 15–41)
Albumin: 2.8 g/dL — ABNORMAL LOW (ref 3.5–5.0)
Alkaline Phosphatase: 125 U/L (ref 38–126)
Anion gap: 12 (ref 5–15)
BUN: 18 mg/dL (ref 6–20)
CO2: 23 mmol/L (ref 22–32)
Calcium: 8.1 mg/dL — ABNORMAL LOW (ref 8.9–10.3)
Chloride: 103 mmol/L (ref 98–111)
Creatinine, Ser: 1 mg/dL (ref 0.61–1.24)
GFR, Estimated: >60 mL/min (ref >60)
Glucose, Bld: 102 mg/dL — ABNORMAL HIGH (ref 70–99)
Potassium: 3.6 mmol/L (ref 3.5–5.1)
Sodium: 138 mmol/L (ref 135–145)
Total Bilirubin: 0.6 mg/dL (ref 0.3–1.2)
Total Protein: 5.9 g/dL — ABNORMAL LOW (ref 6.5–8.1)

## 2022-02-10 LAB — LACTIC ACID, PLASMA
Lactic Acid, Venous: 3.7 mmol/L (ref 0.5–1.9)
Lactic Acid, Venous: 2.3 mmol/L (ref 0.5–1.9)
Lactic Acid, Venous: 2.7 mmol/L (ref 0.5–1.9)

## 2022-02-10 LAB — BASIC METABOLIC PANEL
Anion gap: 11 (ref 5–15)
BUN: 17 mg/dL (ref 6–20)
CO2: 22 mmol/L (ref 22–32)
Calcium: 7.9 mg/dL — ABNORMAL LOW (ref 8.9–10.3)
Chloride: 106 mmol/L (ref 98–111)
Creatinine, Ser: 1.05 mg/dL (ref 0.61–1.24)
GFR, Estimated: >60 mL/min (ref >60)
Glucose, Bld: 151 mg/dL — ABNORMAL HIGH (ref 70–99)
Potassium: 3.5 mmol/L (ref 3.5–5.1)
Sodium: 139 mmol/L (ref 135–145)

## 2022-02-10 LAB — PROCALCITONIN: Procalcitonin: 0.19 ng/mL

## 2022-02-10 MED ORDER — DILTIAZEM HCL ER COATED BEADS 120 MG PO CP24
120.0000 mg | ORAL_CAPSULE | Freq: Every day | ORAL | Status: DC
  Administered 2022-02-11 – 2022-02-12 (×2): 120 mg via ORAL
  Filled 2022-02-10 (×2): qty 1

## 2022-02-10 MED ORDER — TIOTROPIUM BROMIDE MONOHYDRATE 18 MCG IN CAPS
18.0000 ug | ORAL_CAPSULE | Freq: Every day | RESPIRATORY_TRACT | Status: DC
  Administered 2022-02-11 – 2022-02-12 (×2): 18 ug via RESPIRATORY_TRACT

## 2022-02-10 MED ORDER — LATANOPROST 0.005 % OP SOLN
1.0000 [drp] | Freq: Every day | OPHTHALMIC | Status: DC
Start: 2022-02-10 — End: 2022-02-12
  Administered 2022-02-10 – 2022-02-11 (×2): 1 [drp] via OPHTHALMIC
  Filled 2022-02-10: qty 2.5

## 2022-02-10 MED ORDER — LACTATED RINGERS IV SOLN: INTRAVENOUS | Status: AC

## 2022-02-10 MED ORDER — ACETAMINOPHEN 650 MG RE SUPP: 650.0000 mg | Freq: Four times a day (QID) | RECTAL | Status: DC | PRN

## 2022-02-10 MED ORDER — ALBUTEROL SULFATE (2.5 MG/3ML) 0.083% IN NEBU: 3.0000 mL | INHALATION_SOLUTION | RESPIRATORY_TRACT | Status: DC | PRN

## 2022-02-10 MED ORDER — ACETAMINOPHEN 325 MG PO TABS: 650.0000 mg | ORAL_TABLET | Freq: Four times a day (QID) | ORAL | Status: DC | PRN

## 2022-02-10 MED ORDER — ADULT MULTIVITAMIN W/MINERALS CH
1.0000 | ORAL_TABLET | Freq: Every day | ORAL | Status: DC
  Administered 2022-02-11 – 2022-02-12 (×2): 1 via ORAL
  Filled 2022-02-10 (×2): qty 1

## 2022-02-10 MED ORDER — SODIUM CHLORIDE 0.9 % IV SOLN
500.0000 mg | Freq: Once | INTRAVENOUS | Status: AC
  Administered 2022-02-10: 500 mg via INTRAVENOUS
  Filled 2022-02-10 (×2): qty 5

## 2022-02-10 MED ORDER — HYDROCODONE-ACETAMINOPHEN 5-325 MG PO TABS: 1.0000 | ORAL_TABLET | Freq: Three times a day (TID) | ORAL | Status: DC | PRN

## 2022-02-10 MED ORDER — ONDANSETRON HCL 4 MG/2ML IJ SOLN: 4.0000 mg | Freq: Four times a day (QID) | INTRAMUSCULAR | Status: DC | PRN

## 2022-02-10 MED ORDER — TIOTROPIUM BROMIDE MONOHYDRATE 18 MCG IN CAPS
18.0000 ug | ORAL_CAPSULE | Freq: Every day | RESPIRATORY_TRACT | Status: DC
Start: 2022-02-10 — End: 2022-02-10
  Filled 2022-02-10: qty 5

## 2022-02-10 MED ORDER — MOMETASONE FURO-FORMOTEROL FUM 100-5 MCG/ACT IN AERO
2.0000 | INHALATION_SPRAY | Freq: Two times a day (BID) | RESPIRATORY_TRACT | Status: DC
  Administered 2022-02-11 – 2022-02-12 (×2): 2 via RESPIRATORY_TRACT
  Filled 2022-02-10: qty 8.8

## 2022-02-10 MED ORDER — SODIUM CHLORIDE 0.9 % IV SOLN
2.0000 g | Freq: Once | INTRAVENOUS | Status: AC
  Administered 2022-02-10: 2 g via INTRAVENOUS
  Filled 2022-02-10: qty 12.5

## 2022-02-10 MED ORDER — METOPROLOL SUCCINATE ER 25 MG PO TB24
25.0000 mg | ORAL_TABLET | Freq: Every day | ORAL | Status: DC
  Administered 2022-02-11 – 2022-02-12 (×2): 25 mg via ORAL
  Filled 2022-02-10 (×2): qty 1

## 2022-02-10 MED ORDER — PREDNISONE 10 MG PO TABS: 5.0000 mg | ORAL_TABLET | Freq: Every day | ORAL | Status: DC

## 2022-02-10 MED ORDER — ONDANSETRON HCL 4 MG PO TABS: 4.0000 mg | ORAL_TABLET | Freq: Four times a day (QID) | ORAL | Status: DC | PRN

## 2022-02-10 MED ORDER — IPRATROPIUM-ALBUTEROL 0.5-2.5 (3) MG/3ML IN SOLN
3.0000 mL | Freq: Once | RESPIRATORY_TRACT | Status: AC
  Administered 2022-02-10: 3 mL via RESPIRATORY_TRACT
  Filled 2022-02-10: qty 3

## 2022-02-10 MED ORDER — PANTOPRAZOLE SODIUM 20 MG PO TBEC
20.0000 mg | DELAYED_RELEASE_TABLET | Freq: Every day | ORAL | Status: DC
  Administered 2022-02-11 – 2022-02-12 (×2): 20 mg via ORAL
  Filled 2022-02-10 (×2): qty 1

## 2022-02-10 MED ORDER — VANCOMYCIN HCL IN DEXTROSE 1-5 GM/200ML-% IV SOLN
1000.0000 mg | Freq: Once | INTRAVENOUS | Status: AC
  Administered 2022-02-10: 1000 mg via INTRAVENOUS
  Filled 2022-02-10: qty 200

## 2022-02-10 MED ORDER — METHOCARBAMOL 500 MG PO TABS
500.0000 mg | ORAL_TABLET | Freq: Every day | ORAL | Status: DC
  Administered 2022-02-10 – 2022-02-11 (×2): 500 mg via ORAL
  Filled 2022-02-10 (×2): qty 1

## 2022-02-10 MED ORDER — APIXABAN 2.5 MG PO TABS
2.5000 mg | ORAL_TABLET | Freq: Two times a day (BID) | ORAL | Status: DC
  Administered 2022-02-10 – 2022-02-12 (×4): 2.5 mg via ORAL
  Filled 2022-02-10 (×4): qty 1

## 2022-02-10 MED ORDER — VITAMIN B-12 1000 MCG PO TABS
1000.0000 ug | ORAL_TABLET | Freq: Every day | ORAL | Status: DC
Start: 2022-02-11 — End: 2022-02-12
  Administered 2022-02-11 – 2022-02-12 (×2): 1000 ug via ORAL
  Filled 2022-02-10 (×2): qty 1

## 2022-02-10 MED ORDER — LACTATED RINGERS IV SOLN: INTRAVENOUS | Status: DC

## 2022-02-10 MED ORDER — ENSURE ENLIVE PO LIQD
237.0000 mL | Freq: Three times a day (TID) | ORAL | Status: DC
  Administered 2022-02-10 – 2022-02-12 (×5): 237 mL via ORAL

## 2022-02-10 NOTE — Assessment & Plan Note (Signed)
Status post right thoracotomy and wedge resection of a right upper lobe mass. Currently on chemotherapy/immunotherapy Follow-up with oncology as an outpatient

## 2022-02-10 NOTE — ED Notes (Signed)
First Nurse Note: Patient to ED from [facility]. Patient has lung cancer and was initially 82% on RA. Patient was placed on 2L [place] by [place] with O2 now at 95%. Patient was recently dc'd from hospital on Sunday for same. EKG today showed new right bundle branch block.

## 2022-02-10 NOTE — Assessment & Plan Note (Signed)
Blood pressure stable Continue Cardizem and metoprolol

## 2022-02-10 NOTE — Progress Notes (Signed)
Symptom Management [place] at [facility] Telephone:(336) [phone] Fax:(336) [phone]  Patient Care Team: Earlie Server, MD as PCP - General (Oncology) Earlie Server, MD as Consulting Physician (Hematology and Oncology)   Name of the patient: Samuel Choi  [id number]  Apr 18, 1966   Date of visit: 02/10/22  Reason for Consult: Samuel Choi is a 56 y.o. male who presents today for:   HCAP: Patient was last seen on February 01, 2022.  At this time he had suspicions of a pulmonary embolism and/or pneumonia and met sepsis criteria.  He was referred from our office to the emergency room.  There he was admitted from 02/01/2022 through 02/05/2022.  He was diagnosed with healthcare associated pneumonia.  He received vancomycin, cefepime, doxycycline, Augmentin, prednisone and inhalers.  He was referred to cardiology for pulmonary hypertension which was found in his work-up at the hospital.  He was also started on Eliquis for PE.  He presents today for follow-up.  Patient states that he still feels poorly.  He states that he feels about as poorly as when he was first admitted to the hospital.  Despite being hypoxic he was not discharged with oxygen and presents today hypoxic again.  He denies any fevers, shortness of breath or chest pain.  He does not appear confused.  He does recall that he has finished his medications although there is one medicine that was sent to the pharmacy that required a prior authorization so he has not started this.  Denies any neurologic complaints. Denies any easy bleeding or bruising. Reports good appetite and denies weight loss. Denies chest pain. Denies any nausea, vomiting, constipation, or diarrhea. Denies urinary complaints. Patient offers no further specific complaints today.    PAST MEDICAL HISTORY: Past Medical History:  Diagnosis Date   Alcohol abuse    usually drinks 2-3 drinks per day   Atherosclerosis 06/2018   Chronic sinusitis     Dehydration 02/07/2019   Emphysema of lung ([place]) 06/2018   patient unaware of this.   Hip fracture ([place]) 05/2018   no surgery   History of kidney stones 05/2018   per xray, bilateral nephrolitiasis   Hypertension    Squamous cell carcinoma of lung, right ([place]) 06/2018    PAST SURGICAL HISTORY:  Past Surgical History:  Procedure Laterality Date   BRAIN SURGERY  10/2017   nasal/sinus endoscopy. mass benign   ELECTROMAGNETIC NAVIGATION BROCHOSCOPY Right 06/25/2018   Procedure: ELECTROMAGNETIC NAVIGATION BRONCHOSCOPY;  Surgeon: [name], MD;  Location: [facility] ORS;  Service: Cardiopulmonary;  Laterality: Right;   ENDOBRONCHIAL ULTRASOUND Right 06/25/2018   Procedure: ENDOBRONCHIAL ULTRASOUND;  Surgeon: [name], MD;  Location: [facility] ORS;  Service: Cardiopulmonary;  Laterality: Right;   ENDOBRONCHIAL ULTRASOUND Right 12/26/2018   Procedure: ENDOBRONCHIAL ULTRASOUND RIGHT;  Surgeon: [name], MD;  Location: [facility] ORS;  Service: Cardiopulmonary;  Laterality: Right;   FLEXIBLE BRONCHOSCOPY N/A 08/20/2018   Procedure: FLEXIBLE BRONCHOSCOPY PREOP;  Surgeon: [name], MD;  Location: [facility] ORS;  Service: Thoracic;  Laterality: N/A;   IR CV LINE INJECTION  04/04/2019   NASAL SINUS SURGERY  10/2017   At [facility], frontal sinusotomy, ethmoidectomy, resection anterior cranial fossa neoplasm, turbinate resection   PERIPHERAL VASCULAR THROMBECTOMY Left 12/31/2020   Procedure: PERIPHERAL VASCULAR THROMBECTOMY;  Surgeon: Algernon Huxley, MD;  Location: [facility];  Service: Cardiovascular;  Laterality: Left;   PORTACATH PLACEMENT Left 01/15/2019   Procedure: INSERTION PORT-A-CATH;  Surgeon: [name], MD;  Location: [facility];  Service: General;  Laterality: Left;   THORACOTOMY Right 08/13/2018   Procedure: PREOP BROCHOSCOPY WITH RIGHT THORACOTOMY AND RUL RESECTION;  Surgeon: [name], MD;  Location: [facility];  Service: General;  Laterality: Right;   THORACOTOMY Right 08/20/2018   Procedure:  THORACOTOMY MAJOR RIGHT UPPER LOBE LOBECTOMY;  Surgeon: [name], MD;  Location: [facility] OR[facility];  Service: Thoracic;  Laterality: Right;   TOE SURGERY Left    pin in left toe    HEMATOLOGY/ONCOLOGY HISTORY:  Oncology History  Squamous cell carcinoma of right lung ([place])  08/13/2018 Surgery   s/p Bronchoscopy on 06/25/2018. subcarina EBUS FNA was non diagnostic, hypocellular specimen.  08/13/18 Patient underwent right thoracotomy and wedge resection of a right upper lobe mass.  Frozen section was consistent with an inflammatory process.  On the second postop day, preliminary pathology reports high-grade malignancy.  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  08/20/2018 Patient was therefore taken back to the OR and underwent take complete lobectomy  pT1b pN0 cM0 stage I squamous cell lung cancer. Margin is negative.  Recommend observation.     08/13/2018 Initial Diagnosis   Stage I Squamous cell carcinoma of lung, right ([place])   12/03/2018 Progression   12/03/2018 CT chest showed abnormal soft tissue in the right suprahilar region measuring up to 2.5 cm, worrisome for recurrence 11/12/9792 PET Hypermetabolic RIGHT suprahilar mass consistent with lung cancer, recurrence. 12/26/2018 s/p bronchoscopy biopsy. Confirmed local recurrence of squamous cell lung cancer.  medi port placed by Dr.Oaks. 01/20/19 MRI brain w/wo contrast: No evidence of metastatic disease   01/17/2019 - 03/07/2019 Chemotherapy   concurrent chemoradiation on 01/16/2019 Carboplatin AUC of 2 and Taxol 45 mg/m2 weekly finished in 03/05/2019    03/31/2019 Imaging   03/31/19 CT chest showed Right suprahilar lesion has decreased slightly in size in the interval since prior PET-CT.  No new or progressive findings on today's study.   04/10/2019 - 05/20/2020 Chemotherapy   04/10/2019, patient started on durvalumab Q 2 weeks maintenance.    06/14/2020 Progression   06/14/2020 CT chest w contrast showed vidence of progressive lymphangitic  spread of tumor throughout the right lung, with contralateral lung nodules with  right pleural effusion  07/01/22 MRI brain is negative for CNS involvement.  CT abdomen with contrast showed no evidence of abdominal metastatic disease PET scan was not approved by insurance-peer to peer appeal with Dr.Vipul Bhanderi    07/08/2020 Cancer Staging   Staging form: Lung, AJCC 8th Edition - Clinical stage from 07/08/2020: Stage IVA (rcT4, cM1a) - Signed by Earlie Server, MD on 07/08/2020   07/16/2020 - 09/28/2021 Chemotherapy   Carboplatin + Paclitaxel + Pembrolizumab q21d x 4 cycles      10/15/2020 Imaging   10/15/2020 CT chest abdomen pelvis redemonstrated postoperative and postradiation appearance of the right chest with perihilar consolidation and fibrosis.  Slight interval decrease in size of the pulmonary nodules associated with interlobular septal thickening nearly complete resolution of previously seen left-sided pulmonary nodules.  Minimal irregular residual.  Right pleural effusion improved.  Consistent with treatment response.  No evidence of metastatic disease with the abdomen or pelvis.  None obstructive bilateral nephrolithiasis  - partial response to the treatment-  12/15/2020, CT chest without contrast showed resolution of lung nodules, no evidence of recurrent disease   10/19/2020 -  Chemotherapy   Keytruda monotherapy maintenance   12/30/2020 [facility] Admission   patient was admitted due to unprovoked extensive deep vein thrombosis from calf vein to the common  femoral vein on the left.  Patient was started on heparin drip.  Patient underwent thrombolysis and is a colectomy on 12/31/2020.  Anticoagulation was switched to Eliquis at discharge   04/22/2021 Imaging   CT chest with contrast showed none occlusive pulmonary embolism, Interval progression of consolidation airspace opacity in the medial right middle lobe with bandlike opacity and bronchiectasis in the perihilar right lung,-this is  compatible with evolving posttreatment changes although recurrent disease anteriorly is not excluded.Interval development of numerous bilateral pulmonary nodules, measuring up to 11 mm in the right lung base.  Concerning for metastatic disease.  Tiny right pleural effusion.    04/26/2021 -  Chemotherapy   resumed on carboplatin/Taxol/Keytruda. Patient did not tolerate his chemotherapy of carboplatin/Taxol/Keytruda on 04/26/2021. Patient has developed severe anemia status post PRBC transfusion. 05/25/2021, carboplatin AUC 5 with Keytruda 06/23/2021  continued on carboplatin AUC 5 with Keytruda 07/07/2021 carboplatin with [name]      07/15/2021 Progression   07/15/2021 CT chest with contrast showed enlarged right lower lobe thick-walled cavitary lesion in the anterior aspect of the right lower lobe.  Concerning for progression.  Multiple other small pulmonary nodules are also noted elsewhere in the lungs, many of which are stable compared to prior studies.  Several are new or clearly enlarged.  The best example is an enlarging pulmonary nodule in the left upper lobe, 8 x 6 mm comparing to 4 x 3 mm on 04/22/2021   08/03/2021 -  Chemotherapy   Patient is on Treatment Plan :  LUNG Docetaxel + Ramucirumab q21d      11/16/2021 Imaging   1. The previously noted thick-walled cavity of concern in the right lower lobe now appears more simple and thin-walled in appearance, presumably a post infectious cavity. There is a new adjacent thick-walled cavity in the right lower lobe which is aggressive in appearance, but given the evolution of the previous lesion, this may also be of infectious etiology. 2. However, there is an enlarging mixed cystic and solid nodule in the left upper lobe which is concerning for metastatic lesion or new primary lesion. This currently measures 11 x 5 x 10 mm. Close attention on follow-up studies is recommended. 3. No definite signs of metastatic disease to the abdomen or pelvis. 4.  Extensive colonic diverticulosis most involved in the sigmoid colon where there appear to be several small diverticular abscesses. one of these appears to fistulized into the wall of the urinary bladder, without frank fistulization to the lumen of the urinary bladder at this time.      ALLERGIES:  has No Known Allergies.  MEDICATIONS:  Current Outpatient Medications  Medication Sig Dispense Refill   albuterol (VENTOLIN HFA) 108 (90 Base) MCG/ACT inhaler INHALE 2 PUFFS BY MOUTH EVERY 6 HOURS AS NEEDED FOR WHEEZE OR SHORTNESS OF BREATH 8.5 g 5   amoxicillin-clavulanate (AUGMENTIN) 500-125 MG tablet Take 1 tablet (500 mg total) by mouth 2 (two) times daily. Starting in evening 02/05/22 7 tablet 0   benzonatate (TESSALON) 100 MG capsule Take 1 capsule (100 mg total) by mouth 3 (three) times daily as needed for cough. 20 capsule 0   budesonide-formoterol (SYMBICORT) 80-4.5 MCG/ACT inhaler Inhale 2 puffs into the lungs 2 (two) times daily. in the morning and at bedtime. 10.2 each 12   diltiazem (CARDIZEM CD) 120 MG 24 hr capsule Take 120 mg by mouth daily.     doxycycline (VIBRA-TABS) 100 MG tablet Take 1 tablet (100 mg total) by mouth 2 (two) times daily.  Starting in evening 02/05/22 7 tablet 0   ELIQUIS 2.5 MG TABS tablet TAKE 1 TABLET(2.5 MG) BY MOUTH TWICE DAILY 60 tablet 6   feeding supplement (ENSURE ENLIVE / ENSURE PLUS) LIQD Take 237 mLs by mouth 3 (three) times daily between meals. 237 mL 12   HYDROcodone-acetaminophen (NORCO/VICODIN) 5-325 MG tablet Take 1 tablet by mouth every 8 (eight) hours as needed for severe pain. 30 tablet 0   latanoprost (XALATAN) 0.005 % ophthalmic solution Place 1 drop into both eyes at bedtime.     loratadine (CLARITIN) 10 MG tablet Take 10 mg by mouth daily.     methocarbamol (ROBAXIN) 500 MG tablet Take 1 tablet (500 mg total) by mouth at bedtime. 30 tablet 0   metoprolol succinate (TOPROL-XL) 25 MG 24 hr tablet Take 1 tablet (25 mg total) by mouth daily. 30  tablet 0   Multiple Vitamin (MULTIVITAMIN WITH MINERALS) TABS tablet Take 1 tablet by mouth daily.     ondansetron (ZOFRAN) 8 MG tablet Take 1 tablet (8 mg total) by mouth every 8 (eight) hours as needed for refractory nausea / vomiting. Start on day 3 after chemo. 90 tablet 1   pantoprazole (PROTONIX) 20 MG tablet TAKE 1 TABLET(20 MG) BY MOUTH DAILY 60 tablet 2   potassium chloride (KLOR-CON) 10 MEQ tablet Take 10 mEq by mouth daily. Start taking 01/14/22     predniSONE (DELTASONE) 10 MG tablet Take 5 tablets (50 mg total) by mouth daily for 1 day, THEN 4 tablets (40 mg total) daily for 1 day, THEN 3 tablets (30 mg total) daily for 1 day, THEN 2 tablets (20 mg total) daily for 1 day, THEN 1 tablet (10 mg total) daily for 1 day. 15 tablet 0   SPIRIVA HANDIHALER 18 MCG inhalation capsule INHALE 1 CAPSULE VIA HANDIHALER ONCE DAILY AT THE SAME TIME EVERY DAY 30 capsule 0   vitamin B-12 (CYANOCOBALAMIN) 1000 MCG tablet Take 1 tablet (1,000 mcg total) by mouth daily. 90 tablet 1   No current facility-administered medications for this visit.   Facility-Administered Medications Ordered in Other Visits  Medication Dose Route Frequency Provider Last Rate Last Admin   dexamethasone (DECADRON) 10 MG/ML injection            heparin lock flush 100 unit/mL  500 Units Intravenous Once Earlie Server, MD       heparin lock flush 100 unit/mL  500 Units Intravenous Once Earlie Server, MD       sodium chloride flush (NS) 0.9 % injection 10 mL  10 mL Intravenous PRN Earlie Server, MD   10 mL at 04/04/19 0903   sodium chloride flush (NS) 0.9 % injection 10 mL  10 mL Intravenous PRN Earlie Server, MD   10 mL at 07/26/20 1308   sodium chloride flush (NS) 0.9 % injection 10 mL  10 mL Intravenous PRN Earlie Server, MD   10 mL at 07/28/21 0828    VITAL SIGNS: BP 118/70   Pulse 94   Temp 98.7 F (37.1 C)   Resp 16   Wt 128 lb 4.8 oz (58.2 kg)   SpO2 (!) 82%   BMI 16.93 kg/m  Filed Weights   02/10/22 0859  Weight: 128 lb 4.8 oz (58.2 kg)     Estimated body mass index is 16.93 kg/m as calculated from the following:   Height as of 02/01/22: 6' 1" (1.854 m).   Weight as of this encounter: 128 lb 4.8 oz (58.2 kg).  LABS: CBC:  Component Value Date/Time   WBC 30.6 (H) 02/10/2022 0840   HGB 9.3 (L) 02/10/2022 0840   HGB 15.9 11/02/2014 1859   HCT 27.0 (L) 02/10/2022 0840   HCT 46.0 11/02/2014 1859   PLT 91 (L) 02/10/2022 0840   PLT 143 (L) 11/02/2014 1859   MCV 104.2 (H) 02/10/2022 0840   MCV 100 11/02/2014 1859   NEUTROABS 28.1 (H) 02/10/2022 0840   NEUTROABS 9.0 (H) 07/07/2014 0516   LYMPHSABS 1.0 02/10/2022 0840   LYMPHSABS 1.5 07/07/2014 0516   MONOABS 0.8 02/10/2022 0840   MONOABS 0.6 07/07/2014 0516   EOSABS 0.0 02/10/2022 0840   EOSABS 0.3 07/07/2014 0516   BASOSABS 0.1 02/10/2022 0840   BASOSABS 0.1 07/07/2014 0516   Comprehensive Metabolic Panel:    Component Value Date/Time   NA 138 02/10/2022 0840   NA 137 11/02/2014 1859   K 3.6 02/10/2022 0840   K 3.5 11/02/2014 1859   CL 103 02/10/2022 0840   CL 101 11/02/2014 1859   CO2 23 02/10/2022 0840   CO2 26 11/02/2014 1859   BUN 18 02/10/2022 0840   BUN 11 11/02/2014 1859   CREATININE 1.00 02/10/2022 0840   CREATININE 1.06 11/02/2014 1859   GLUCOSE 102 (H) 02/10/2022 0840   GLUCOSE 100 (H) 11/02/2014 1859   CALCIUM 8.1 (L) 02/10/2022 0840   CALCIUM 8.9 11/02/2014 1859   AST 31 02/10/2022 0840   AST 40 11/02/2014 1859   ALT 21 02/10/2022 0840   ALT 23 11/02/2014 1859   ALKPHOS 125 02/10/2022 0840   ALKPHOS 83 11/02/2014 1859   BILITOT 0.6 02/10/2022 0840   BILITOT 1.1 11/02/2014 1859   PROT 5.9 (L) 02/10/2022 0840   PROT 7.8 11/02/2014 1859   ALBUMIN 2.8 (L) 02/10/2022 0840   ALBUMIN 4.0 11/02/2014 1859    RADIOGRAPHIC STUDIES: ECHOCARDIOGRAM COMPLETE  Result Date: 02/04/2022    ECHOCARDIOGRAM REPORT   Patient Name:   SALIM FORERO Date of Exam: 02/03/2022 Medical Rec #:  [id number]      Height:       73.0 in Accession #:    [id number]      Weight:       128.1 lb Date of Birth:  Jan 07, 1966       BSA:          1.780 m Patient Age:    38 years       BP:           123/87 mmHg Patient Gender: M              HR:           98 bpm. Exam Location:  [facility] Procedure: 2D Echo, Cardiac Doppler and Color Doppler Indications:     SVT (supraventricular tachycardia) ([place]) [[id number]]                  Pulmonary embolism ([place]) [[id number]]  History:         Patient has prior history of Echocardiogram examinations, most                  recent 01/01/2021. Risk Factors:Hypertension and ETOH.  Sonographer:     Bernadene Person RDCS Referring Phys:  [id number] Alanson Puls TANG Diagnosing Phys: Isaias Cowman MD  Sonographer Comments: Image acquisition challenging due to respiratory motion. IMPRESSIONS  1. Left ventricular ejection fraction, by estimation, is 50 to 55%. The left ventricle has low normal function. The left ventricle has no regional wall motion abnormalities. There  is mild left ventricular hypertrophy. Left ventricular diastolic parameters are consistent with Grade I diastolic dysfunction (impaired relaxation).  2. Right ventricular systolic function is normal. The right ventricular size is mildly enlarged. There is severely elevated pulmonary artery systolic pressure.  3. Right atrial size was moderately dilated.  4. The mitral valve is normal in structure. No evidence of mitral valve regurgitation. No evidence of mitral stenosis.  5. Tricuspid valve regurgitation is moderate to severe.  6. The aortic valve is normal in structure. Aortic valve regurgitation is not visualized. No aortic stenosis is present.  7. The inferior vena cava is normal in size with greater than 50% respiratory variability, suggesting right atrial pressure of 3 mmHg. FINDINGS  Left Ventricle: Left ventricular ejection fraction, by estimation, is 50 to 55%. The left ventricle has low normal function. The left ventricle has no regional wall motion abnormalities. The left ventricular internal  cavity size was normal in size. There is mild left ventricular hypertrophy. Left ventricular diastolic parameters are consistent with Grade I diastolic dysfunction (impaired relaxation). Right Ventricle: The right ventricular size is mildly enlarged. No increase in right ventricular wall thickness. Right ventricular systolic function is normal. There is severely elevated pulmonary artery systolic pressure. The tricuspid regurgitant velocity is 4.41 m/s, and with an assumed right atrial pressure of 3 mmHg, the estimated right ventricular systolic pressure is 29.9 mmHg. Left Atrium: Left atrial size was normal in size. Right Atrium: Right atrial size was moderately dilated. Pericardium: There is no evidence of pericardial effusion. Mitral Valve: The mitral valve is normal in structure. No evidence of mitral valve regurgitation. No evidence of mitral valve stenosis. Tricuspid Valve: The tricuspid valve is normal in structure. Tricuspid valve regurgitation is moderate to severe. No evidence of tricuspid stenosis. Aortic Valve: The aortic valve is normal in structure. Aortic valve regurgitation is not visualized. No aortic stenosis is present. Pulmonic Valve: The pulmonic valve was normal in structure. Pulmonic valve regurgitation is not visualized. No evidence of pulmonic stenosis. Aorta: The aortic root is normal in size and structure. Venous: The inferior vena cava is normal in size with greater than 50% respiratory variability, suggesting right atrial pressure of 3 mmHg. IAS/Shunts: No atrial level shunt detected by color flow Doppler.  LEFT VENTRICLE PLAX 2D LVIDd:         4.00 cm   Diastology LVIDs:         2.95 cm   LV e' medial:    6.92 cm/s LV PW:         1.07 cm   LV E/e' medial:  9.8 LV IVS:        1.10 cm   LV e' lateral:   7.10 cm/s LVOT diam:     2.10 cm   LV E/e' lateral: 9.5 LV SV:         55 LV SV Index:   31 LVOT Area:     3.46 cm  RIGHT VENTRICLE RV S prime:     12.20 cm/s TAPSE (M-mode): 1.8 cm LEFT  ATRIUM           Index        RIGHT ATRIUM           Index LA diam:      3.50 cm 1.97 cm/m   RA Area:     24.60 cm LA Vol (A2C): 30.6 ml 17.19 ml/m  RA Volume:   90.80 ml  51.00 ml/m LA Vol (A4C): 26.1 ml 14.66 ml/m  AORTIC VALVE LVOT Vmax:   115.00 cm/s LVOT Vmean:  73.900 cm/s LVOT VTI:    0.158 m  AORTA Ao Root diam: 3.30 cm Ao Asc diam:  3.20 cm MITRAL VALVE               TRICUSPID VALVE MV Area (PHT): 4.19 cm    TR Peak grad:   77.8 mmHg MV Decel Time: 181 msec    TR Vmax:        441.00 cm/s MV E velocity: 67.70 cm/s MV A velocity: 78.00 cm/s  SHUNTS MV E/A ratio:  0.87        Systemic VTI:  0.16 m                            Systemic Diam: 2.10 cm Isaias Cowman MD Electronically signed by Isaias Cowman MD Signature Date/Time: 02/04/2022/2:59:37 PM    Final    CT Angio Chest PE W and/or Wo Contrast  Result Date: 02/01/2022 CLINICAL DATA:  Lung cancer.  Shortness of breath and hypoxia. * Tracking Code: BO * EXAM: CT ANGIOGRAPHY CHEST WITH CONTRAST TECHNIQUE: Multidetector CT imaging of the chest was performed using the standard protocol during bolus administration of intravenous contrast. Multiplanar CT image reconstructions and MIPs were obtained to evaluate the vascular anatomy. RADIATION DOSE REDUCTION: This exam was performed according to the departmental dose-optimization program which includes automated exposure control, adjustment of the mA and/or kV according to patient size and/or use of iterative reconstruction technique. CONTRAST:  70m OMNIPAQUE IOHEXOL 350 MG/ML SOLN COMPARISON:  PET-CT 11/21/2021 and chest CT 11/15/2021 FINDINGS: Cardiovascular: Left Port-A-Cath tip: Distal SVC. Possible fibrin sheath distally. Scattered findings of chronic pulmonary embolus. This includes in the left pulmonary artery on image 185 where there are peripheral string like filling defects; the peripheral chronic filling defect along the margin of the right lower lobe pulmonary artery on image 213  series 7; an adjacent web-like filling defect in a segmental branch of the right lower lobe pulmonary artery on image 212 of series 7; and a web like filling defect at the origin of a left lower lobe segmental branch on image 260 series 7. These all have characteristic appearance of chronic embolus, and I do not discern current findings of acute pulmonary embolus or substantial worsening of appearance compared to prior exams. Mild prominence the main pulmonary artery at 3.7 cm diameter, potentially reflecting pulmonary arterial hypertension. Coronary, aortic arch, and branch vessel atherosclerotic vascular disease. Mediastinum/Nodes: No pathologic adenopathy. Lungs/Pleura: Right upper lobectomy. Stable radiation therapy related findings in the right perihilar region. Improved airspace opacity around peripheral cavitary process in the right lower lobe, currently the cavity or air cyst measures 1.4 by 1.4 cm on image 86 of series 6 and has wall thickness of about 1-2 mm. Similarly mild wall thickening associated with a second bulla or small cavity more medially on image 87 series 6. Patchy peripheral mainly tree-in-bud reticulonodular opacities in the posterior basal segment right lower lobe, images 95 through 135 series 6, favoring atypical infection. Airway thickening and some airway plugging in the right lower lobe. Mosaic attenuation in both lungs. Underlying emphysema. Mild tree-in-bud reticulonodular opacities increased in the posterior basal segment left lower lobe and in the lingula. Upper Abdomen: Unremarkable Musculoskeletal: Unremarkable Review of the MIP images confirms the above findings. IMPRESSION: 1. Bilateral chronic pulmonary embolus but no acute pulmonary embolus. 2. Probable pulmonary arterial hypertension. 3.  Aortic Atherosclerosis (ICD10-I70.0).  Coronary atherosclerosis. 4. Improved airspace opacity along the peripheral cavitary process in the right lower lobe, currently the thin walled air  cyst or cavity measures about 1.4 cm in diameter. 5. Increased peripheral tree-in-bud reticulonodular opacities in both lower lobes in the lingula, compatible with atypical infection. 6. Airway thickening and airway plugging particularly in the right lower lobe. 7. Mosaic attenuation in the lungs, stable but nonspecific. 8.  Emphysema (ICD10-J43.9). 9. Stable radiation therapy related findings in the right perihilar regions. Right upper lobectomy. Electronically Signed   By: Van Clines M.D.   On: 02/01/2022 17:21   DG Chest 2 View  Result Date: 02/01/2022 CLINICAL DATA:  Shortness of breath EXAM: CHEST - 2 VIEW COMPARISON:  Previous studies including examination of 01-Mar-2022 FINDINGS: Cardiac size is within normal limits. There is decreased volume right lung. Linear densities are seen in right parahilar region and right lower lung field with no significant change suggesting scarring. Blunting of right lateral CP angle may suggest pleural thickening. There are no new infiltrates or signs of pulmonary edema. There is no pneumothorax. Tip of chest port is seen in the course of the superior vena cava. IMPRESSION: There are no new infiltrates or signs of pulmonary edema. Electronically Signed   By: Elmer Picker M.D.   On: 02/01/2022 12:37   DG Chest 2 View  Result Date: 01-Mar-2022 CLINICAL DATA:  Persistent cough EXAM: CHEST - 2 VIEW COMPARISON:  02/04/2020 FINDINGS: Postsurgical changes of right upper lobectomy with interstitial thickening and scarring. Unchanged blunting of the right costophrenic angle, likely scarring. Unchanged cardiac and mediastinal contours. Left chest port with catheter tip in the SVC. The left lung is clear. No acute osseous abnormality. IMPRESSION: Stable chest radiograph. Electronically Signed   By: Merilyn Baba M.D.   On: 03-01-2022 13:16    PERFORMANCE STATUS (ECOG) : 3 - Symptomatic, >50% confined to bed  Review of Systems Unless otherwise noted, a complete  review of systems is negative.  Physical Exam General: Sitting in wheelchair. AAOx3 Cardiovascular: regular rate and rhythm Pulmonary: clear ant fields Abdomen: soft, nontender, + bowel sounds Extremities: no edema, no joint deformities Skin: no rashes, no cyanosis  Neurological: Weakness but otherwise nonfocal  Assessment and Plan- Patient is a 56 y.o. male    Encounter Diagnoses  Name Primary?   Squamous cell carcinoma of right lung (HCC) Yes   Hypoxia    HCAP (healthcare-associated pneumonia)    Irregular heart rate    Right bundle branch block     Symptoms are concerning.  Patient was given 2 L of oxygen and EKG was obtained given his examination and vital signs. On 2L his O2 increased to 95%. EKG shows artifact secondary to his tremor but also shows a new right bundle branch block. Patient takes to ER for further evaluation. Triage nurse notified.    Patient expressed understanding and was in agreement with this plan. He also understands that He can call clinic at any time with any questions, concerns, or complaints.   Thank you for allowing me to participate in the care of this very pleasant patient.   Time Total: 30  Visit consisted of counseling and education dealing with the complex and emotionally intense issues of symptom management in the setting of serious illness.Greater than 50%  of this time was spent counseling and coordinating care related to the above assessment and plan.  Signed by: Nelwyn Salisbury, PA-C

## 2022-02-10 NOTE — ED Provider Notes (Signed)
[facility] Provider Note    Event Date/Time   First MD Initiated Contact with Patient 02/10/22 1015     (approximate)   History   Shortness of Breath   HPI  Samuel Choi is a 56 y.o. male   extensive past medical history with squamous cell carcinoma of the right lung undergoing chemotherapy with recent admission in the hospital for healthcare associated pneumonia found to have pulmonary embolism put on Eliquis.  He re presents to the ER today from cancer center clinic after being found that he was persistently hypoxic.  Has been having persistent shortness of breath.  Does feel exertional dyspnea.      Physical Exam   Triage Vital Signs: ED Triage Vitals  Enc Vitals Group     BP      Pulse      Resp      Temp      Temp src      SpO2      Weight      Height      Head Circumference      Peak Flow      Pain Score      Pain Loc      Pain Edu?      Excl. in [place]?     Most recent vital signs: Vitals:   02/10/22 1025 02/10/22 1030  BP: 124/78 119/77  Pulse:  82  Resp:  (!) 21  Temp:    SpO2:  98%     Constitutional: Alert  Eyes: Conjunctivae are normal.  Head: Atraumatic. Nose: No congestion/rhinnorhea. Mouth/Throat: Mucous membranes are moist.   Neck: Painless ROM.  Cardiovascular:   Good peripheral circulation. Respiratory: Mild tachypnea with scattered wheeze throughout. Gastrointestinal: Soft and nontender.  Musculoskeletal:  no deformity Neurologic:  MAE spontaneously. No gross focal neurologic deficits are appreciated.  Skin:  Skin is warm, dry and intact. No rash noted. Psychiatric: Mood and affect are normal. Speech and behavior are normal.    ED Results / Procedures / Treatments   Labs (all labs ordered are listed, but only abnormal results are displayed) Labs Reviewed  CBC WITH DIFFERENTIAL/PLATELET - Abnormal; Notable for the following components:      Result Value   WBC 34.8 (*)    RBC 2.57 (*)    Hemoglobin  9.4 (*)    HCT 27.1 (*)    MCV 105.4 (*)    MCH 36.6 (*)    RDW 17.2 (*)    Platelets 96 (*)    nRBC 2.0 (*)    Neutro Abs 33.2 (*)    Lymphs Abs 0.2 (*)    Abs Immature Granulocytes 0.74 (*)    All other components within normal limits  BASIC METABOLIC PANEL - Abnormal; Notable for the following components:   Glucose, Bld 151 (*)    Calcium 7.9 (*)    All other components within normal limits  CULTURE, BLOOD (ROUTINE X 2)  CULTURE, BLOOD (ROUTINE X 2)  LACTIC ACID, PLASMA  LACTIC ACID, PLASMA  PROCALCITONIN     EKG  ED ECG REPORT I, Merlyn Lot, the attending physician, personally viewed and interpreted this ECG.   Date: 02/10/2022  EKG Time: 10:24  Rate: 80  Rhythm: sinus  Axis: normal  Intervals:rbbb  ST&T Change: nonspecific st abn, no stemi    RADIOLOGY Please see ED Course for my review and interpretation.  I personally reviewed all radiographic images ordered to evaluate for the above  acute complaints and reviewed radiology reports and findings.  These findings were personally discussed with the patient.  Please see medical record for radiology report.    PROCEDURES:  Critical Care performed: Yes, see critical care procedure note(s)   .Critical Care  Performed by: Merlyn Lot, MD Authorized by: Merlyn Lot, MD   Critical care provider statement:    Critical care time (minutes):  40   Critical care was necessary to treat or prevent imminent or life-threatening deterioration of the following conditions:  Respiratory failure   Critical care was time spent personally by me on the following activities:  Ordering and performing treatments and interventions, ordering and review of laboratory studies, ordering and review of radiographic studies, pulse oximetry, re-evaluation of patient's condition, review of old charts, obtaining history from patient or surrogate, examination of patient, evaluation of patient's response to treatment, discussions  with primary provider, discussions with consultants and development of treatment plan with patient or surrogate    MEDICATIONS ORDERED IN ED: Medications  lactated ringers infusion (has no administration in time range)  vancomycin (VANCOCIN) IVPB 1000 mg/200 mL premix (has no administration in time range)  ceFEPIme (MAXIPIME) 2 g in sodium chloride 0.9 % 100 mL IVPB (has no administration in time range)  azithromycin (ZITHROMAX) 500 mg in sodium chloride 0.9 % 250 mL IVPB (has no administration in time range)  ipratropium-albuterol (DUONEB) 0.5-2.5 (3) MG/3ML nebulizer solution 3 mL (3 mLs Nebulization Given 02/10/22 1052)     IMPRESSION / MDM / [place] / ED COURSE  I reviewed the triage vital signs and the nursing notes.                              Differential diagnosis includes, but is not limited to, Asthma, copd, CHF, pna, ptx, malignancy, Pe, anemia  Patient presented to the ER with evidence of acute respiratory failure and hypoxia as described above.  This presenting complaint could reflect a potentially life-threatening illness therefore the patient will be placed on continuous pulse oximetry and telemetry for monitoring.  Laboratory evaluation will be sent to evaluate for the above complaints.    Clinical Course as of 02/10/22 1144  Fri Feb 10, 2022  1055 This x-ray on my review interpretation does not show any evidence pneumothorax [PR]  1139 Given possible worsening infiltrate on chest x-ray ordered antibiotics in setting of concern for worsening of dyspnea and acute respiratory failure with hypoxia.  I have consulted with hospitalist for admission [PR]    Clinical Course User Index [PR] Merlyn Lot, MD      FINAL CLINICAL IMPRESSION(S) / ED DIAGNOSES   Final diagnoses:  Acute respiratory failure with hypoxia ([place])     Rx / DC Orders   ED Discharge Orders     None        Note:  This document was prepared using Dragon voice recognition  software and may include unintentional dictation errors.    Merlyn Lot, MD 02/10/22 1144

## 2022-02-10 NOTE — Assessment & Plan Note (Addendum)
Patient with a history of COPD, squamous cell lung cancer, recent hospitalization for healthcare associated pneumonia who was sent to the ER for evaluation of respiratory distress and hypoxia with room air pulse oximetry in the 80s.  We will plan to discharge patient on 2 L nasal cannula unclear etiology as his symptoms have almost completely resolved.  No evidence of pulmonary embolus, acute infection or heart failure.  Infection as above appears more chronic in nature and will still need to be worked up.

## 2022-02-10 NOTE — H&P (Signed)
History and Physical    Patient: Samuel Choi TML:[id number] DOB: April 06, 1966 DOA: 02/10/2022 DOS: the patient was seen and examined on 02/10/2022 PCP: Earlie Server, MD  Patient coming from: Home  Chief Complaint:  Chief Complaint  Patient presents with   Shortness of Breath   HPI: Samuel Choi is a 56 y.o. 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  Patient improved and was discharged home on oral antibiotic (Augmentin and prednisone)  which he has completed.  He required oxygen supplementation during his hospitalization but was not discharged home on oxygen. He had gone to the cancer center for a scheduled follow-up and was found to be hypoxic with room air pulse oximetry in the 80s and so was sent to the ER for further evaluation. He complains of shortness of breath which he said is unchanged from his baseline but denies having any cough, no fever, no chills, no chest pain.  He feels weak and fatigued. He has no abdominal pain, no changes in his bowel habits, no nausea, no vomiting, no headache, no dizziness, no lightheadedness, no blurred vision no focal deficit. Labs reveal marked leukocytosis of 34,000 but this is downtrending from his recent hospitalization. He will be admitted to the hospital for further evaluation.   Review of Systems: As mentioned in the history of present illness. All other systems reviewed and are negative. Past Medical History:  Diagnosis Date   Alcohol abuse    usually drinks 2-3 drinks per day   Atherosclerosis 06/2018   Chronic sinusitis     Dehydration 02/07/2019   Emphysema of lung ([place]) 06/2018   patient unaware of this.   Hip fracture ([place]) 05/2018   no surgery   History of kidney stones 05/2018   per xray, bilateral nephrolitiasis   Hypertension    Squamous cell carcinoma of lung, right ([place]) 06/2018   Past Surgical History:  Procedure Laterality Date   BRAIN SURGERY  10/2017   nasal/sinus endoscopy. mass benign   ELECTROMAGNETIC NAVIGATION BROCHOSCOPY Right 06/25/2018   Procedure: ELECTROMAGNETIC NAVIGATION BRONCHOSCOPY;  Surgeon: [name], MD;  Location: [facility];  Service: Cardiopulmonary;  Laterality: Right;   ENDOBRONCHIAL ULTRASOUND Right 06/25/2018   Procedure: ENDOBRONCHIAL ULTRASOUND;  Surgeon: [name], MD;  Location: [facility];  Service: Cardiopulmonary;  Laterality: Right;   ENDOBRONCHIAL ULTRASOUND Right 12/26/2018   Procedure: ENDOBRONCHIAL ULTRASOUND RIGHT;  Surgeon: [name], MD;  Location: [facility];  Service: Cardiopulmonary;  Laterality: Right;   FLEXIBLE BRONCHOSCOPY N/A 08/20/2018   Procedure: FLEXIBLE BRONCHOSCOPY PREOP;  Surgeon: [name], MD;  Location: [facility];  Service: Thoracic;  Laterality: N/A;   IR CV LINE INJECTION  04/04/2019   NASAL SINUS SURGERY  10/2017   At [facility], frontal sinusotomy, ethmoidectomy, resection anterior cranial fossa neoplasm, turbinate resection   PERIPHERAL VASCULAR THROMBECTOMY Left 12/31/2020   Procedure: PERIPHERAL VASCULAR THROMBECTOMY;  Surgeon: Algernon Huxley, MD;  Location: [facility];  Service: Cardiovascular;  Laterality: Left;   PORTACATH PLACEMENT Left 01/15/2019   Procedure: INSERTION PORT-A-CATH;  Surgeon: [name], MD;  Location: [facility];  Service: General;  Laterality: Left;   THORACOTOMY Right 08/13/2018   Procedure:  PREOP BROCHOSCOPY WITH RIGHT THORACOTOMY AND RUL RESECTION;  Surgeon: [name], MD;  Location: [facility];  Service: General;  Laterality: Right;   THORACOTOMY Right 08/20/2018   Procedure: THORACOTOMY MAJOR RIGHT  UPPER LOBE LOBECTOMY;  Surgeon: [name], MD;  Location: [facility];  Service: Thoracic;  Laterality: Right;   TOE SURGERY Left    pin in left toe   Social History:  reports that he quit smoking about 3 years ago. His smoking use included cigarettes. He has a 7.50 pack-year smoking history. He has never used smokeless tobacco. He reports current alcohol use of about 3.0 standard drinks of alcohol per week. He reports that he does not use drugs.  No Known Allergies  Family History  Problem Relation Age of Onset   Breast cancer Mother    Diabetes Mother    Lung cancer Father    Hypertension Father     Prior to Admission medications   Medication Sig Start Date End Date Taking? Authorizing Provider  albuterol (VENTOLIN HFA) 108 (90 Base) MCG/ACT inhaler INHALE 2 PUFFS BY MOUTH EVERY 6 HOURS AS NEEDED FOR WHEEZE OR SHORTNESS OF BREATH 10/27/21   Earlie Server, MD  amoxicillin-clavulanate (AUGMENTIN) 500-125 MG tablet Take 1 tablet (500 mg total) by mouth 2 (two) times daily. Starting in evening 02/05/22 02/05/22   Emeterio Reeve, DO  benzonatate (TESSALON) 100 MG capsule Take 1 capsule (100 mg total) by mouth 3 (three) times daily as needed for cough. 01/31/22   Borders, Kirt Boys, NP  budesonide-formoterol (SYMBICORT) 80-4.5 MCG/ACT inhaler Inhale 2 puffs into the lungs 2 (two) times daily. in the morning and at bedtime. 10/19/21   Earlie Server, MD  diltiazem (CARDIZEM CD) 120 MG 24 hr capsule Take 120 mg by mouth daily. 06/19/19   [provider]  doxycycline (VIBRA-TABS) 100 MG tablet Take 1 tablet (100 mg total) by mouth 2 (two) times daily. Starting in evening 02/05/22 02/05/22   Emeterio Reeve, DO  ELIQUIS 2.5 MG TABS tablet TAKE 1 TABLET(2.5 MG) BY MOUTH TWICE DAILY 02/04/22   Earlie Server, MD  feeding supplement (ENSURE ENLIVE / ENSURE PLUS) LIQD Take 237 mLs by mouth 3 (three) times daily between meals. 02/05/22   Emeterio Reeve, DO  HYDROcodone-acetaminophen (NORCO/VICODIN) 5-325 MG  tablet Take 1 tablet by mouth every 8 (eight) hours as needed for severe pain. 10/12/21   Borders, Kirt Boys, NP  latanoprost (XALATAN) 0.005 % ophthalmic solution Place 1 drop into both eyes at bedtime. 05/04/21   [provider]  loratadine (CLARITIN) 10 MG tablet Take 10 mg by mouth daily.    [provider]  methocarbamol (ROBAXIN) 500 MG tablet Take 1 tablet (500 mg total) by mouth at bedtime. 01/25/22   Earlie Server, MD  metoprolol succinate (TOPROL-XL) 25 MG 24 hr tablet Take 1 tablet (25 mg total) by mouth daily. 01/02/21   Loletha Grayer, MD  Multiple Vitamin (MULTIVITAMIN WITH MINERALS) TABS tablet Take 1 tablet by mouth daily. 02/05/22   Emeterio Reeve, DO  ondansetron (ZOFRAN) 8 MG tablet Take 1 tablet (8 mg total) by mouth every 8 (eight) hours as needed for refractory nausea / vomiting. Start on day 3 after chemo. 01/03/22   Earlie Server, MD  pantoprazole (PROTONIX) 20 MG tablet TAKE 1 TABLET(20 MG) BY MOUTH DAILY 02/04/22   Earlie Server, MD  potassium chloride (KLOR-CON) 10 MEQ tablet Take 10 mEq by mouth daily. Start taking 01/14/22    [provider]  predniSONE (DELTASONE) 10 MG tablet  Take 5 tablets (50 mg total) by mouth daily for 1 day, THEN 4 tablets (40 mg total) daily for 1 day, THEN 3 tablets (30 mg total) daily for 1 day, THEN 2 tablets (20 mg total) daily for 1 day, THEN 1 tablet (10 mg total) daily for 1 day. 02/05/22 02/10/22  Emeterio Reeve, DO  SPIRIVA HANDIHALER 18 MCG inhalation capsule INHALE 1 CAPSULE VIA HANDIHALER ONCE DAILY AT THE SAME TIME EVERY DAY 04/08/20   Earlie Server, MD  vitamin B-12 (CYANOCOBALAMIN) 1000 MCG tablet Take 1 tablet (1,000 mcg total) by mouth daily. 08/04/19   Earlie Server, MD    Physical Exam: Vitals:   02/10/22 1030 02/10/22 1100 02/10/22 1130 02/10/22 1243  BP: 119/77 125/82 112/79 119/78  Pulse: 82 83 79 85  Resp: (!) 21 19 (!) 21 20  Temp:    98.6 F (37 C)  SpO2: 98% 100% 95% 93%  Weight:      Height:       Physical  Exam Vitals and nursing note reviewed.  Constitutional:      Comments: Chronically ill-appearing, thin and frail  HENT:     Head: Normocephalic and atraumatic.     Mouth/Throat:     Mouth: Mucous membranes are moist.  Eyes:     Comments: Pale conjunctiva  Cardiovascular:     Rate and Rhythm: Normal rate and regular rhythm.  Pulmonary:     Effort: Tachypnea present.     Comments: Scattered rhonchi and wheezes Abdominal:     General: Bowel sounds are normal.     Palpations: Abdomen is soft.  Musculoskeletal:        General: Normal range of motion.     Cervical back: Normal range of motion and neck supple.  Skin:    General: Skin is warm and dry.  Neurological:     Mental Status: He is alert.     Motor: Weakness present.  Psychiatric:        Mood and Affect: Mood normal.        Behavior: Behavior normal.     Data Reviewed: Relevant notes from primary care and specialist visits, past discharge summaries as available in EHR, including Care Everywhere. Prior diagnostic testing as pertinent to current admission diagnoses Updated medications and problem lists for reconciliation ED course, including vitals, labs, imaging, treatment and response to treatment Triage notes, nursing and pharmacy notes and ED provider's notes Notable results as noted in HPI Labs reviewed.  Lactic acid 3.7, white count 34.8, hemoglobin 9.4, hematocrit 27.1, MCV 105, RDW 17.2, platelet count 96, sodium 139, potassium 3.5, chloride 106, bicarb 22, glucose 151, BUN 17, creatinine 1.05, calcium 7.9 Chest x-ray reviewed by me shows Slightly increased opacity in the right mid lung, which is nonspecific but may be infectious or related to the cavitary process in this region. Twelve-lead EKG reviewed by me shows normal sinus rhythm, incomplete right bundle branch block, right atrial enlargement. There are no new results to review at this time.  Assessment and Plan: * Acute respiratory failure ([place]) Patient  with a history of COPD, squamous cell lung cancer, recent hospitalization for healthcare associated pneumonia who was sent to the ER for evaluation of respiratory distress and hypoxia with room air pulse oximetry in the 80s. Patient is currently on 2 L of oxygen with pulse oximetry greater than 92%. He will need to be assessed for home oxygen need prior to discharge Continue oxygen supplementation to maintain pulse oximetry greater than 92%  HCAP (healthcare-associated pneumonia) Patient recently admitted to the hospital for healthcare associated pneumonia and treated with vancomycin and cefepime while in the hospital and discharged on Augmentin which he has completed. Patient is afebrile, not tachycardic but has leukocytosis and lactic acidosis. Obtain CT scan of the chest without contrast for further evaluation. We will hold off on further antibiotic therapy for now   COPD (chronic obstructive pulmonary disease) (HCC) Stable Continue as needed bronchodilator therapy as well as inhaled steroids Continue oxygen supplementation to maintain pulse oximetry greater than 92%  Squamous cell carcinoma of right lung (HCC) Status post right thoracotomy and wedge resection of a right upper lobe mass. Currently on chemotherapy/immunotherapy Follow-up with oncology as an outpatient  Lactic acidosis Unclear etiology No evidence of sepsis at this Repeat lactic acid level  Essential hypertension Blood pressure stable Continue Cardizem and metoprolol  SVT (supraventricular tachycardia) (HCC) Patient has a history of SVT Continue metoprolol and Cardizem  History of thrombosis Stable Continue Eliquis  Severe protein-calorie malnutrition ([place]) Patient has a BMI of 16.87 kg/m2 Patient has severe protein calorie malnutrition related to chronic illness as evidenced by severe fat and muscle depletion. Continue nutritional supplements     Thrombocytopenia (HCC) Stable No evidence of bleeding  at this time      Advance Care Planning:   Code Status: Full Code   Consults: None  Family Communication: Greater than 50% of time was spent discussing patient's condition and plan of care with him at the bedside.  All questions and concerns have been addressed.  He verbalizes understanding and agrees with the plan.  Severity of Illness: The appropriate patient status for this patient is INPATIENT. Inpatient status is judged to be reasonable and necessary in order to provide the required intensity of service to ensure the patient's safety. The patient's presenting symptoms, physical exam findings, and initial radiographic and laboratory data in the context of their chronic comorbidities is felt to place them at high risk for further clinical deterioration. Furthermore, it is not anticipated that the patient will be medically stable for discharge from the hospital within 2 midnights of admission.   * I certify that at the point of admission it is my clinical judgment that the patient will require inpatient hospital care spanning beyond 2 midnights from the point of admission due to high intensity of service, high risk for further deterioration and high frequency of surveillance required.*  Author: Collier Bullock, MD 02/10/2022 1:13 PM  For on call review [email or web address].

## 2022-02-10 NOTE — Assessment & Plan Note (Signed)
Stable No evidence of bleeding at this time

## 2022-02-10 NOTE — Assessment & Plan Note (Signed)
Patient has a history of SVT Continue metoprolol and Cardizem

## 2022-02-10 NOTE — ED Triage Notes (Signed)
Pt to ED via [place]. Pt has lung cancer and went for treatment this morning. His SpO2 was in the 80's on room air. Pt was placed on 2 liters [place] and brought to Ed for evaluation. Pt was just discharged on Sunday from inpatient. Pt states that he was diagnosed with pneumonia. Pt reports that he does not feel like he got any better. Pt is able to speak in short sentences.

## 2022-02-10 NOTE — Assessment & Plan Note (Addendum)
Stable.  Continue oxygen supplementation to maintain pulse oximetry greater than 92%.  Oxygen saturations at 86% on room air even at rest.

## 2022-02-10 NOTE — Assessment & Plan Note (Signed)
Stable Continue Eliquis 

## 2022-02-10 NOTE — Assessment & Plan Note (Addendum)
Patient recently admitted to the hospital for healthcare associated pneumonia and treated with vancomycin and cefepime while in the hospital and discharged on Augmentin which he has completed. Patient is afebrile, not tachycardic.  Leukocytosis improving unknown, likely from steroids.  Lactic acidosis secondary to albuterol use.

## 2022-02-10 NOTE — Progress Notes (Signed)
PHARMACY -  BRIEF ANTIBIOTIC NOTE   Pharmacy has received consult(s) for vacomyicn and cefepime from an ED provider.  The patient's profile has been reviewed for ht/wt/allergies/indication/available labs.    One time order(s) placed by MD for cefepime 2 gm and Vancomycin 1 gm  Further antibiotics/pharmacy consults should be ordered by admitting physician if indicated.                       Thank you, Samuel Choi A 02/10/2022  11:34 AM

## 2022-02-10 NOTE — Assessment & Plan Note (Addendum)
No evidence of sepsis.  Secondary to albuterol use

## 2022-02-10 NOTE — Assessment & Plan Note (Signed)
Patient has a BMI of 16.87 kg/m2 Patient has severe protein calorie malnutrition related to chronic illness as evidenced by severe fat and muscle depletion. Continue nutritional supplements

## 2022-02-11 ENCOUNTER — Encounter: Payer: Self-pay | Admitting: Internal Medicine

## 2022-02-11 DIAGNOSIS — E43 Unspecified severe protein-calorie malnutrition: Secondary | ICD-10-CM | POA: Diagnosis not present

## 2022-02-11 DIAGNOSIS — J984 Other disorders of lung: Secondary | ICD-10-CM | POA: Diagnosis not present

## 2022-02-11 DIAGNOSIS — J9601 Acute respiratory failure with hypoxia: Secondary | ICD-10-CM | POA: Diagnosis not present

## 2022-02-11 DIAGNOSIS — J439 Emphysema, unspecified: Secondary | ICD-10-CM | POA: Diagnosis not present

## 2022-02-11 LAB — CBC
HCT: 26.5 % — ABNORMAL LOW (ref 39.0–52.0)
Hemoglobin: 8.8 g/dL — ABNORMAL LOW (ref 13.0–17.0)
MCH: 35.2 pg — ABNORMAL HIGH (ref 26.0–34.0)
MCHC: 33.2 g/dL (ref 30.0–36.0)
MCV: 106 fL — ABNORMAL HIGH (ref 80.0–100.0)
Platelets: 79 10*3/uL — ABNORMAL LOW (ref 150–400)
RBC: 2.5 MIL/uL — ABNORMAL LOW (ref 4.22–5.81)
RDW: 17.1 % — ABNORMAL HIGH (ref 11.5–15.5)
WBC: 29.4 10*3/uL — ABNORMAL HIGH (ref 4.0–10.5)
nRBC: 0.9 % — ABNORMAL HIGH (ref 0.0–0.2)

## 2022-02-11 LAB — BASIC METABOLIC PANEL
Anion gap: 10 (ref 5–15)
BUN: 18 mg/dL (ref 6–20)
CO2: 23 mmol/L (ref 22–32)
Calcium: 8.4 mg/dL — ABNORMAL LOW (ref 8.9–10.3)
Chloride: 106 mmol/L (ref 98–111)
Creatinine, Ser: 0.9 mg/dL (ref 0.61–1.24)
GFR, Estimated: >60 mL/min (ref >60)
Glucose, Bld: 117 mg/dL — ABNORMAL HIGH (ref 70–99)
Potassium: 3.5 mmol/L (ref 3.5–5.1)
Sodium: 139 mmol/L (ref 135–145)

## 2022-02-11 MED ORDER — FLUTICASONE PROPIONATE 50 MCG/ACT NA SUSP
1.0000 | Freq: Every day | NASAL | Status: DC
Start: 2022-02-11 — End: 2022-02-12
  Administered 2022-02-11 – 2022-02-12 (×2): 1 via NASAL
  Filled 2022-02-11: qty 16

## 2022-02-11 MED ORDER — ORAL CARE MOUTH RINSE: 15.0000 mL | OROMUCOSAL | Status: DC | PRN

## 2022-02-11 NOTE — Hospital Course (Addendum)
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  Confirmed to be in acute respiratory failure admitted to the hospitalist service for further work-up.  CT scan of chest revealed cavitary lesions of the anterior right lower lobe demonstrating increased wall thickening and a new irregular solid pulmonary nodule of the left upper lobe, findings favored to be due to chronic atypical infection such as a nontuberculous mycobacterial infection.  Initially patient was placed on 2 L nasal cannula in the emergency department and is breathing comfortably.  Attempted to take patient off of oxygen led to hypoxia.  Patient CT findings discussed with infectious disease.  While not felt to be TB, highly suspicious for Mycobacterium infection, especially in immunosuppressed patient.  Patient placed in respiratory isolation and was able to be generate AFB sputum x3.  We will follow-up with infectious disease in the outpatient setting.

## 2022-02-11 NOTE — Progress Notes (Signed)
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  Confirmed to be in acute respiratory failure admitted to the hospitalist service for further work-up.  CT scan of chest revealed cavitary lesions of the anterior right lower lobe demonstrating increased wall thickening and a new irregular solid pulmonary nodule of the left upper lobe, findings favored to be due to chronic atypical infection such as a nontuberculous mycobacterial infection.  Initially patient was placed on 2 L nasal cannula in the emergency department and is breathing comfortably, and by later that day, able to be weaned down to 1 L and maintain good oxygen saturations.  Assessment and Plan: Assessment and Plan: * Cavitary lesion of lung Noted on chest CT. 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  Also appears to be more chronic although lesions look to be thickened in nature.  Discussed with infectious disease.  Patient certainly is at risk given history of immunosuppressive therapy for chemotherapy.  Currently afebrile.  Nevertheless, plan is to place patient in negative pressure room with airborne isolation.  Patient needs to produce 3 sputum samples at least 8 hours apart.  If he is unable to do so, will consult pulmonary for bronchoscopy.  If he is able to do so, will discharge home after.  Acute respiratory failure (HCC) Patient with a history of COPD, squamous cell lung cancer, recent hospitalization for healthcare associated  pneumonia who was sent to the ER for evaluation of respiratory distress and hypoxia with room air pulse oximetry in the 80s.  Unclear etiology as his symptoms have almost completely resolved.  No evidence of pulmonary embolus, acute infection, heart failure or worsening cancer.  Infection as above appears more chronic in nature and will still need to be worked up.  COPD (chronic obstructive pulmonary disease) (HCC) Stable Continue as needed bronchodilator therapy as well as inhaled steroids Continue oxygen supplementation to maintain pulse oximetry greater than 92%  Squamous cell carcinoma of right lung (HCC) Status post right thoracotomy and wedge resection of a right upper lobe mass. Currently on chemotherapy/immunotherapy Follow-up with oncology as an outpatient  HCAP (healthcare-associated pneumonia)-resolved as of 02/11/2022 Patient recently admitted to the hospital for healthcare associated pneumonia and treated with vancomycin and cefepime while in the hospital and discharged on Augmentin which he has completed. Patient is afebrile, not tachycardic but has leukocytosis and lactic acidosis. Obtain CT scan of the chest without contrast for further evaluation. We will hold off on further antibiotic therapy for now   Lactic acidosis No evidence of sepsis.  Secondary to albuterol use  Essential hypertension Blood pressure stable Continue Cardizem and metoprolol  SVT (supraventricular tachycardia) (HCC) Patient has a history of SVT Continue metoprolol and Cardizem  History of thrombosis Stable Continue Eliquis  Severe protein-calorie malnutrition ([place]) Patient has a BMI of 16.87 kg/m2 Patient has severe protein calorie malnutrition related to chronic illness as evidenced by severe fat and muscle depletion. Continue nutritional supplements     Thrombocytopenia (HCC) Stable No evidence of bleeding at this  time       Body mass index is 16.87 kg/m.         Consultants: Case discussed with infectious disease  Procedures: None  Antimicrobials: None  Code Status: Full code   Subjective: Doing okay, occasional cough  Objective: Vital signs were reviewed and unremarkable. Vitals:   02/11/22 0739 02/11/22 1120  BP: 110/75 110/64  Pulse: 89 97  Resp: 19 18  Temp: 98.4 F (36.9 C) 98.6 F (37 C)  SpO2: 94% 94%    Intake/Output Summary (Last 24 hours) at 02/11/2022 1514 Last data filed at 02/11/2022 1126 Gross per 24 hour  Intake 1730 ml  Output 400 ml  Net 1330 ml   Filed Weights   02/10/22 1023  Weight: 58 kg   Body mass index is 16.87 kg/m.  Exam:  General: Alert and oriented x3, no acute distress HEENT: Normocephalic and atraumatic, mucous membranes slightly dry Cardiovascular: Regular rate and rhythm, S1-S2 Respiratory: Decreased breath sounds throughout with mild end expiratory wheeze Abdomen: Soft, nontender, nondistended, positive bowel Musculoskeletal: No clubbing or cyanosis or Skin: No skin breaks, tears or lesions Psychiatry: Appropriate, no evidence of psychosis Neurology: No focal deficits  Data Reviewed: Improving white blood cell count.  Hemoglobin 8.8.  Platelet count of 79  Disposition:  Status is: Inpatient Remains inpatient appropriate because: Sputum x3    Anticipated discharge date: 8/6 if he can produce 3 sputum's  Family Communication: Left message for wife DVT Prophylaxis: apixaban (ELIQUIS) tablet 2.5 mg Start: 02/10/22 2200 apixaban (ELIQUIS) tablet 2.5 mg    Author: Annita Brod ,MD 02/11/2022 3:14 PM  To reach On-call, see care teams to locate the attending and reach out via [email or web address]. Between 7PM-7AM, please contact night-coverage If you still have difficulty reaching the attending provider, please page the [facility] (Director on Call) for [facility] on amion for assistance.

## 2022-02-11 NOTE — Assessment & Plan Note (Addendum)
Noted on chest CT. 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  Also appears to be more chronic although lesions look to be thickened in nature.  Discussed with infectious disease.  Patient certainly is at risk given history of immunosuppressive therapy for chemotherapy.  Currently afebrile.  Nevertheless, plan is to place patient in negative pressure room with airborne isolation.  Patient able to be produce 3 sputum samples at least 8 hours apart.  Patient will follow-up with infectious disease to review results.

## 2022-02-12 DIAGNOSIS — I10 Essential (primary) hypertension: Secondary | ICD-10-CM

## 2022-02-12 DIAGNOSIS — J984 Other disorders of lung: Secondary | ICD-10-CM | POA: Diagnosis not present

## 2022-02-12 DIAGNOSIS — J9601 Acute respiratory failure with hypoxia: Secondary | ICD-10-CM | POA: Diagnosis not present

## 2022-02-12 DIAGNOSIS — E43 Unspecified severe protein-calorie malnutrition: Secondary | ICD-10-CM | POA: Diagnosis not present

## 2022-02-12 LAB — CBC
HCT: 24.1 % — ABNORMAL LOW (ref 39.0–52.0)
Hemoglobin: 8.3 g/dL — ABNORMAL LOW (ref 13.0–17.0)
MCH: 36.1 pg — ABNORMAL HIGH (ref 26.0–34.0)
MCHC: 34.4 g/dL (ref 30.0–36.0)
MCV: 104.8 fL — ABNORMAL HIGH (ref 80.0–100.0)
Platelets: 77 10*3/uL — ABNORMAL LOW (ref 150–400)
RBC: 2.3 MIL/uL — ABNORMAL LOW (ref 4.22–5.81)
RDW: 16.9 % — ABNORMAL HIGH (ref 11.5–15.5)
WBC: 37 10*3/uL — ABNORMAL HIGH (ref 4.0–10.5)
nRBC: 0.2 % (ref 0.0–0.2)

## 2022-02-12 MED ORDER — HEPARIN SOD (PORK) LOCK FLUSH 100 UNIT/ML IV SOLN
500.0000 [IU] | Freq: Once | INTRAVENOUS | Status: AC
Start: 2022-02-12 — End: 2022-02-12
  Administered 2022-02-12: 500 [IU] via INTRAVENOUS
  Filled 2022-02-12: qty 5

## 2022-02-12 NOTE — Discharge Summary (Signed)
Physician Discharge Summary   Patient: Samuel Choi MRN: [id number] DOB: [birth year]  Admit date:     02/10/2022  Discharge date: 02/12/2022  Discharge Physician: Annita Brod   PCP: Earlie Server, MD   Recommendations at discharge:   Patient being discharged on home oxygen 2 L nasal cannula.  He qualified for this clubbing at rest noted be down to 86%. Patient will follow-up with infectious disease clinic on results of AFB sputums.  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  Confirmed to be in acute respiratory failure admitted to the hospitalist service for further work-up.  CT scan of chest revealed cavitary lesions of the anterior right lower lobe demonstrating increased wall thickening and a new irregular solid pulmonary nodule of the left upper lobe, findings favored to be due to chronic atypical infection such as a nontuberculous mycobacterial infection.  Initially patient was placed on 2 L nasal cannula in the emergency department and is breathing comfortably.  Attempted to take patient off of oxygen led to hypoxia.  Patient CT findings discussed with infectious disease.  While not  felt to be TB, highly suspicious for Mycobacterium infection, especially in immunosuppressed patient.  Patient placed in respiratory isolation and was able to be generate AFB sputum x3.  We will follow-up with infectious disease in the outpatient setting.  Assessment and Plan: * Cavitary lesion of lung Noted on chest CT. 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  Also appears to be more chronic although lesions look to be thickened in nature.  Discussed with infectious disease.  Patient certainly is at risk given history of immunosuppressive therapy for chemotherapy.  Currently afebrile.  Nevertheless, plan is to place patient in negative pressure room with airborne isolation.  Patient able to be produce 3 sputum samples at least 8 hours apart.  Patient will follow-up with infectious disease to review results.  Acute respiratory failure (HCC) Patient with a history of COPD, squamous cell lung cancer, recent hospitalization for healthcare associated pneumonia who was sent to the ER for evaluation of respiratory distress and hypoxia with room air pulse oximetry in the 80s.  We will plan to discharge patient on 2 L nasal cannula unclear etiology as his symptoms have almost completely resolved.  No evidence of pulmonary embolus, acute infection or heart failure.  Infection as above appears more chronic in nature and will still need to be worked up.  COPD (chronic obstructive pulmonary disease) (HCC) Stable.  Continue oxygen supplementation to maintain pulse oximetry greater than 92%.  Oxygen saturations at 86% on room air even at rest.  Squamous cell carcinoma of right lung (HCC) Status post right thoracotomy and wedge resection of a right upper lobe mass. Currently on chemotherapy/immunotherapy Follow-up with oncology as an outpatient  HCAP (healthcare-associated pneumonia)-resolved as of 02/11/2022 Patient recently admitted to the hospital for healthcare associated pneumonia and treated with  vancomycin and cefepime while in the hospital and discharged on Augmentin which he has completed. Patient is afebrile, not tachycardic.  Leukocytosis improving unknown, likely from steroids.  Lactic acidosis secondary to albuterol use.  Lactic acidosis No evidence of sepsis.  Secondary to albuterol use  Essential hypertension Blood pressure stable Continue Cardizem and metoprolol  SVT (supraventricular tachycardia) (HCC) Patient has a history of SVT Continue metoprolol and Cardizem  History of thrombosis Stable Continue Eliquis  Severe protein-calorie malnutrition ([place]) Patient has a BMI of 16.87 kg/m2 Patient has severe protein calorie malnutrition related to chronic illness as evidenced by severe fat and muscle depletion. Continue nutritional supplements     Thrombocytopenia (HCC) Stable No evidence of bleeding at this time         Consultants: Infectious disease Procedures performed: None Disposition: Home Diet recommendation:  Discharge Diet Orders (From admission, onward)     Start     Ordered   02/12/22 0000  Diet - low sodium heart healthy        02/12/22 1603           Regular diet DISCHARGE MEDICATION: Allergies as of 02/12/2022   No Known Allergies      Medication List     STOP taking these medications    predniSONE 10 MG tablet Commonly known as: DELTASONE       TAKE these medications    albuterol 108 (90 Base) MCG/ACT inhaler Commonly known as: VENTOLIN HFA INHALE 2 PUFFS BY MOUTH EVERY 6 HOURS AS NEEDED FOR WHEEZE OR SHORTNESS OF BREATH   benzonatate 100 MG capsule Commonly known as: TESSALON Take 1 capsule (100 mg total) by mouth 3 (three) times daily as needed for cough.   budesonide-formoterol 80-4.5 MCG/ACT inhaler Commonly known as: Symbicort Inhale 2 puffs into the lungs 2 (two) times daily. in the morning and at bedtime.   cyanocobalamin 1000 MCG tablet Commonly known as: VITAMIN B12 Take 1 tablet (1,000 mcg total)  by mouth daily.   diltiazem 120 MG 24 hr capsule Commonly known as: CARDIZEM CD Take 120 mg by mouth daily.   feeding supplement Liqd Take 237 mLs by mouth 3 (three) times daily between meals.   HYDROcodone-acetaminophen 5-325 MG tablet Commonly known as: NORCO/VICODIN Take 1 tablet by mouth every 8 (eight) hours as needed for severe pain.   latanoprost 0.005 % ophthalmic solution Commonly known as: XALATAN Place 1 drop into both eyes at bedtime.   loratadine 10 MG tablet Commonly known as: CLARITIN Take 10 mg by mouth daily.   methocarbamol 500 MG tablet Commonly known as: ROBAXIN Take 1 tablet (500 mg total) by mouth at bedtime.   metoprolol succinate 25 MG 24 hr tablet Commonly known as: TOPROL-XL Take 1 tablet (25 mg total) by mouth daily.   multivitamin with minerals Tabs tablet Take 1 tablet by mouth daily.   ondansetron 8 MG tablet Commonly known as: Zofran Take 1 tablet (8 mg total) by mouth every 8 (eight) hours as needed for refractory nausea / vomiting. Start on day 3 after chemo.   pantoprazole 20 MG tablet Commonly known as: PROTONIX TAKE 1 TABLET(20 MG) BY MOUTH DAILY   potassium chloride 10 MEQ tablet Commonly known as: KLOR-CON Take 10 mEq by mouth daily. Start taking 01/14/22   Spiriva HandiHaler 18 MCG inhalation capsule Generic drug: tiotropium INHALE 1 CAPSULE VIA HANDIHALER ONCE DAILY  AT [place] DAY        Discharge Exam: Filed Weights   02/10/22 1023  Weight: 58 kg   General: Alert and oriented x 3, no acute distress Cardiovascular: Regular rate rhythm, S1-S2 Lungs: Decreased breath sounds throughout  Condition at discharge: good  The results of significant diagnostics from this hospitalization (including imaging, microbiology, ancillary and laboratory) are listed below for reference.   Imaging Studies: CT CHEST WO CONTRAST  Result Date: 02/10/2022 CLINICAL DATA:  Pneumonia suspected EXAM: CT CHEST WITHOUT CONTRAST  TECHNIQUE: Multidetector CT imaging of the chest was performed following the standard protocol without IV contrast. RADIATION DOSE REDUCTION: This exam was performed according to the departmental dose-optimization program which includes automated exposure control, adjustment of the mA and/or kV according to patient size and/or use of iterative reconstruction technique. COMPARISON:  Chest CT dated February 01, 2022 FINDINGS: Cardiovascular: Mild cardiomegaly. Small pericardial effusion, similar to prior exam. Atherosclerotic disease of the thoracic aorta. Moderate coronary artery calcifications. Left chest wall port with tip in the mid SVC. Mediastinum/Nodes: Thyroid is unremarkable. Esophagus is unremarkable. No pathologically enlarged lymph nodes seen in the chest. Lungs/Pleura: Moderate centrilobular emphysema. Similar bilateral mosaic attenuation.Right paramediastinal postradiation change, stable when compared with prior exam. Cavitary lesions of the anterior right lower lobe located on series 3, image 77 demonstrate increased wall thickening when compared with prior exam, reference cavitary lesion measuring 19 x 16 mm on series 3, image 77, previously 15 x 13 mm. New irregular solid nodule of the left upper lobe measures 11 x 7 mm on image 49. Previously seen clustered centrilobular nodules of the posterior right lower lobe have resolved. Upper Abdomen: No acute abnormality. Musculoskeletal: No chest wall mass or suspicious bone lesions identified. IMPRESSION: 1. Cavitary lesions of the anterior right lower lobe demonstrate increased wall thickening and there is a new irregular solid pulmonary nodule of the left upper lobe. 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 2. Stable right paramediastinal postradiation change. 3. Similar bilateral mosaic attenuation,  findings can be seen in setting of small airways disease or pulmonary hypertension. 4. Aortic Atherosclerosis (ICD10-I70.0) and Emphysema (ICD10-J43.9). Electronically Signed   By: Yetta Glassman M.D.   On: 02/10/2022 15:16   DG Chest 2 View  Result Date: 02/10/2022 CLINICAL DATA:  Shortness of breath EXAM: CHEST - 2 VIEW COMPARISON:  02/01/2022 FINDINGS: Left chest port with catheter tip approximating the SVC. Normal cardiac and mediastinal contours. Again noted is decreased volume in the right lung with unchanged linear densities in the right parahilar region and right lower lung, likely scarring. Blunting of the right costophrenic angle, chronic, likely pleural thickening. Normal left costophrenic angle. Slightly increased opacity in the right mid lung, possibly related to the known cavitary lesions in this region. The left lung is clear. No pneumothorax. IMPRESSION: Slightly increased opacity in the right mid lung, which is nonspecific but may be infectious or related to the cavitary process in this region. Electronically Signed   By: Merilyn Baba M.D.   On: 02/10/2022 11:00   ECHOCARDIOGRAM COMPLETE  Result Date: 02/04/2022    ECHOCARDIOGRAM REPORT   Patient Name:   ANTHONIE LOTITO Date of Exam: 02/03/2022 Medical Rec #:  [id number]      Height:       73.0 in Accession #:    [id number]     Weight:  128.1 lb Date of Birth:  Jan 26, 1966       BSA:          1.780 m Patient Age:    9 years       BP:           123/87 mmHg Patient Gender: M              HR:           98 bpm. Exam Location:  [facility] Procedure: 2D Echo, Cardiac Doppler and Color Doppler Indications:     SVT (supraventricular tachycardia) ([place]) [202906]                  Pulmonary embolism ([place]) [[id number]]  History:         Patient has prior history of Echocardiogram examinations, most                  recent 01/01/2021. Risk Factors:Hypertension and ETOH.  Sonographer:     Bernadene Person RDCS Referring Phys:  [id number] Alanson Puls TANG Diagnosing  Phys: Isaias Cowman MD  Sonographer Comments: Image acquisition challenging due to respiratory motion. IMPRESSIONS  1. Left ventricular ejection fraction, by estimation, is 50 to 55%. The left ventricle has low normal function. The left ventricle has no regional wall motion abnormalities. There is mild left ventricular hypertrophy. Left ventricular diastolic parameters are consistent with Grade I diastolic dysfunction (impaired relaxation).  2. Right ventricular systolic function is normal. The right ventricular size is mildly enlarged. There is severely elevated pulmonary artery systolic pressure.  3. Right atrial size was moderately dilated.  4. The mitral valve is normal in structure. No evidence of mitral valve regurgitation. No evidence of mitral stenosis.  5. Tricuspid valve regurgitation is moderate to severe.  6. The aortic valve is normal in structure. Aortic valve regurgitation is not visualized. No aortic stenosis is present.  7. The inferior vena cava is normal in size with greater than 50% respiratory variability, suggesting right atrial pressure of 3 mmHg. FINDINGS  Left Ventricle: Left ventricular ejection fraction, by estimation, is 50 to 55%. The left ventricle has low normal function. The left ventricle has no regional wall motion abnormalities. The left ventricular internal cavity size was normal in size. There is mild left ventricular hypertrophy. Left ventricular diastolic parameters are consistent with Grade I diastolic dysfunction (impaired relaxation). Right Ventricle: The right ventricular size is mildly enlarged. No increase in right ventricular wall thickness. Right ventricular systolic function is normal. There is severely elevated pulmonary artery systolic pressure. The tricuspid regurgitant velocity is 4.41 m/s, and with an assumed right atrial pressure of 3 mmHg, the estimated right ventricular systolic pressure is 56.4 mmHg. Left Atrium: Left atrial size was normal in size.  Right Atrium: Right atrial size was moderately dilated. Pericardium: There is no evidence of pericardial effusion. Mitral Valve: The mitral valve is normal in structure. No evidence of mitral valve regurgitation. No evidence of mitral valve stenosis. Tricuspid Valve: The tricuspid valve is normal in structure. Tricuspid valve regurgitation is moderate to severe. No evidence of tricuspid stenosis. Aortic Valve: The aortic valve is normal in structure. Aortic valve regurgitation is not visualized. No aortic stenosis is present. Pulmonic Valve: The pulmonic valve was normal in structure. Pulmonic valve regurgitation is not visualized. No evidence of pulmonic stenosis. Aorta: The aortic root is normal in size and structure. Venous: The inferior vena cava is normal in size with greater than 50% respiratory variability, suggesting right atrial pressure of  3 mmHg. IAS/Shunts: No atrial level shunt detected by color flow Doppler.  LEFT VENTRICLE PLAX 2D LVIDd:         4.00 cm   Diastology LVIDs:         2.95 cm   LV e' medial:    6.92 cm/s LV PW:         1.07 cm   LV E/e' medial:  9.8 LV IVS:        1.10 cm   LV e' lateral:   7.10 cm/s LVOT diam:     2.10 cm   LV E/e' lateral: 9.5 LV SV:         55 LV SV Index:   31 LVOT Area:     3.46 cm  RIGHT VENTRICLE RV S prime:     12.20 cm/s TAPSE (M-mode): 1.8 cm LEFT ATRIUM           Index        RIGHT ATRIUM           Index LA diam:      3.50 cm 1.97 cm/m   RA Area:     24.60 cm LA Vol (A2C): 30.6 ml 17.19 ml/m  RA Volume:   90.80 ml  51.00 ml/m LA Vol (A4C): 26.1 ml 14.66 ml/m  AORTIC VALVE LVOT Vmax:   115.00 cm/s LVOT Vmean:  73.900 cm/s LVOT VTI:    0.158 m  AORTA Ao Root diam: 3.30 cm Ao Asc diam:  3.20 cm MITRAL VALVE               TRICUSPID VALVE MV Area (PHT): 4.19 cm    TR Peak grad:   77.8 mmHg MV Decel Time: 181 msec    TR Vmax:        441.00 cm/s MV E velocity: 67.70 cm/s MV A velocity: 78.00 cm/s  SHUNTS MV E/A ratio:  0.87        Systemic VTI:  0.16 m                             Systemic Diam: 2.10 cm Isaias Cowman MD Electronically signed by Isaias Cowman MD Signature Date/Time: 02/04/2022/2:59:37 PM    Final    CT Angio Chest PE W and/or Wo Contrast  Result Date: 02/01/2022 CLINICAL DATA:  Lung cancer.  Shortness of breath and hypoxia. * Tracking Code: BO * EXAM: CT ANGIOGRAPHY CHEST WITH CONTRAST TECHNIQUE: Multidetector CT imaging of the chest was performed using the standard protocol during bolus administration of intravenous contrast. Multiplanar CT image reconstructions and MIPs were obtained to evaluate the vascular anatomy. RADIATION DOSE REDUCTION: This exam was performed according to the departmental dose-optimization program which includes automated exposure control, adjustment of the mA and/or kV according to patient size and/or use of iterative reconstruction technique. CONTRAST:  56mL OMNIPAQUE IOHEXOL 350 MG/ML SOLN COMPARISON:  PET-CT 11/21/2021 and chest CT 11/15/2021 FINDINGS: Cardiovascular: Left Port-A-Cath tip: Distal SVC. Possible fibrin sheath distally. Scattered findings of chronic pulmonary embolus. This includes in the left pulmonary artery on image 185 where there are peripheral string like filling defects; the peripheral chronic filling defect along the margin of the right lower lobe pulmonary artery on image 213 series 7; an adjacent web-like filling defect in a segmental branch of the right lower lobe pulmonary artery on image 212 of series 7; and a web like filling defect at the origin of a left lower lobe segmental branch  on image 260 series 7. These all have characteristic appearance of chronic embolus, and I do not discern current findings of acute pulmonary embolus or substantial worsening of appearance compared to prior exams. Mild prominence the main pulmonary artery at 3.7 cm diameter, potentially reflecting pulmonary arterial hypertension. Coronary, aortic arch, and branch vessel atherosclerotic vascular disease.  Mediastinum/Nodes: No pathologic adenopathy. Lungs/Pleura: Right upper lobectomy. Stable radiation therapy related findings in the right perihilar region. Improved airspace opacity around peripheral cavitary process in the right lower lobe, currently the cavity or air cyst measures 1.4 by 1.4 cm on image 86 of series 6 and has wall thickness of about 1-2 mm. Similarly mild wall thickening associated with a second bulla or small cavity more medially on image 87 series 6. Patchy peripheral mainly tree-in-bud reticulonodular opacities in the posterior basal segment right lower lobe, images 95 through 135 series 6, favoring atypical infection. Airway thickening and some airway plugging in the right lower lobe. Mosaic attenuation in both lungs. Underlying emphysema. Mild tree-in-bud reticulonodular opacities increased in the posterior basal segment left lower lobe and in the lingula. Upper Abdomen: Unremarkable Musculoskeletal: Unremarkable Review of the MIP images confirms the above findings. IMPRESSION: 1. Bilateral chronic pulmonary embolus but no acute pulmonary embolus. 2. Probable pulmonary arterial hypertension. 3.  Aortic Atherosclerosis (ICD10-I70.0).  Coronary atherosclerosis. 4. Improved airspace opacity along the peripheral cavitary process in the right lower lobe, currently the thin walled air cyst or cavity measures about 1.4 cm in diameter. 5. Increased peripheral tree-in-bud reticulonodular opacities in both lower lobes in the lingula, compatible with atypical infection. 6. Airway thickening and airway plugging particularly in the right lower lobe. 7. Mosaic attenuation in the lungs, stable but nonspecific. 8.  Emphysema (ICD10-J43.9). 9. Stable radiation therapy related findings in the right perihilar regions. Right upper lobectomy. Electronically Signed   By: Van Clines M.D.   On: 02/01/2022 17:21   DG Chest 2 View  Result Date: 02/01/2022 CLINICAL DATA:  Shortness of breath EXAM: CHEST - 2  VIEW COMPARISON:  Previous studies including examination of 2022/02/02 FINDINGS: Cardiac size is within normal limits. There is decreased volume right lung. Linear densities are seen in right parahilar region and right lower lung field with no significant change suggesting scarring. Blunting of right lateral CP angle may suggest pleural thickening. There are no new infiltrates or signs of pulmonary edema. There is no pneumothorax. Tip of chest port is seen in the course of the superior vena cava. IMPRESSION: There are no new infiltrates or signs of pulmonary edema. Electronically Signed   By: Elmer Picker M.D.   On: 02/01/2022 12:37   DG Chest 2 View  Result Date: 02/02/2022 CLINICAL DATA:  Persistent cough EXAM: CHEST - 2 VIEW COMPARISON:  02/04/2020 FINDINGS: Postsurgical changes of right upper lobectomy with interstitial thickening and scarring. Unchanged blunting of the right costophrenic angle, likely scarring. Unchanged cardiac and mediastinal contours. Left chest port with catheter tip in the SVC. The left lung is clear. No acute osseous abnormality. IMPRESSION: Stable chest radiograph. Electronically Signed   By: Merilyn Baba M.D.   On: Feb 02, 2022 13:16    Microbiology: Results for orders placed or performed during the hospital encounter of 02/10/22  Blood Culture (routine x 2)     Status: None   Collection Time: 02/10/22 12:05 PM   Specimen: BLOOD RIGHT HAND  Result Value Ref Range Status   Specimen Description BLOOD RIGHT HAND  Final   Special Requests   Final  BOTTLES DRAWN AEROBIC AND ANAEROBIC Blood Culture adequate volume   Culture   Final    NO GROWTH 5 DAYS Performed at [facility], [address] [place], [place] [address]    Report Status 02/15/2022 FINAL  Final  Blood Culture (routine x 2)     Status: None   Collection Time: 02/10/22  3:34 PM   Specimen: BLOOD  Result Value Ref Range Status   Specimen Description BLOOD BLOOD RIGHT FOREARM  Final    Special Requests   Final    BOTTLES DRAWN AEROBIC AND ANAEROBIC Blood Culture adequate volume   Culture   Final    NO GROWTH 5 DAYS Performed at [facility], [address], [place], [place] [address]    Report Status 02/15/2022 FINAL  Final  MTB-RIF NAA with AFB Culture, sputum     Status: None (Preliminary result)   Collection Time: 02/11/22  2:29 PM   Specimen: Expectorated Sputum  Result Value Ref Range Status   Myco tuberculosis Complex Comment NOT DETECTED Final    Comment: Mycobacterium tuberculosis complex (MTBC) NOT detected.   Rifampin Comment Susceptible Final    Comment: (NOTE) Because Mycobacterium tuberculosis complex (MTBC) was not detected, no rifampin determination is possible.    AFB Specimen Processing Concentration  Final    Comment: (NOTE) Performed At: [facility] [place], [place] [id number] Rush Farmer MD PI:[id number]    Acid Fast Culture PENDING  Incomplete   Source (MTB RIF) SPU  Final    Comment: Performed at [facility], [address] [place], [place] [address]  MTB-RIF NAA with AFB Culture, sputum     Status: None (Preliminary result)   Collection Time: 02/11/22  6:00 PM   Specimen: Expectorated Sputum  Result Value Ref Range Status   Myco tuberculosis Complex Comment NOT DETECTED Final    Comment: Mycobacterium tuberculosis complex (MTBC) NOT detected.   Rifampin Comment Susceptible Final    Comment: (NOTE) Because Mycobacterium tuberculosis complex (MTBC) was not detected, no rifampin determination is possible.    AFB Specimen Processing Concentration  Final    Comment: (NOTE) Performed At: [facility] [place], [place] [id number] Rush Farmer MD DD:[id number]    Acid Fast Culture PENDING  Incomplete   Source (MTB RIF) SPUTUM  Final    Comment: Performed at [facility], [address] [place], [place] [address]  MTB-RIF NAA with AFB Culture, sputum      Status: None (Preliminary result)   Collection Time: 02/12/22 10:40 AM   Specimen: Expectorated Sputum  Result Value Ref Range Status   Myco tuberculosis Complex Comment NOT DETECTED Final    Comment: Mycobacterium tuberculosis complex (MTBC) NOT detected.   Rifampin Comment Susceptible Final    Comment: (NOTE) Because Mycobacterium tuberculosis complex (MTBC) was not detected, no rifampin determination is possible.    AFB Specimen Processing Concentration  Final    Comment: (NOTE) Performed At: [facility] [place], [place] [id number] Rush Farmer MD [id number]    Acid Fast Culture PENDING  Incomplete   Source (MTB RIF) SPUTUM  Final    Comment: Performed at [facility], [address] [place], [place] [address]    Labs: CBC: Recent Labs  Lab 02/10/22 0840 02/10/22 1035 02/11/22 1155 02/12/22 0500 02/15/22 0852  WBC 30.6* 34.8* 29.4* 37.0* 26.5*  NEUTROABS 28.1* 33.2*  --   --  24.8*  HGB 9.3* 9.4* 8.8* 8.3* 8.5*  HCT  27.0* 27.1* 26.5* 24.1* 24.6*  MCV 104.2* 105.4* 106.0* 104.8* 105.1*  PLT 91* 96* 79* 77* 735*   Basic Metabolic Panel: Recent Labs  Lab 02/10/22 0840 02/10/22 1035 02/11/22 1155 02/15/22 0852  NA 138 139 139 139  K 3.6 3.5 3.5 4.0  CL 103 106 106 102  CO2 23 22 23 27   GLUCOSE 102* 151* 117* 125*  BUN 18 17 18 11   CREATININE 1.00 1.05 0.90 0.88  CALCIUM 8.1* 7.9* 8.4* 8.5*   Liver Function Tests: Recent Labs  Lab 02/10/22 0840 02/15/22 0852  AST 31 21  ALT 21 19  ALKPHOS 125 101  BILITOT 0.6 0.4  PROT 5.9* 6.1*  ALBUMIN 2.8* 2.8*   CBG: No results for input(s): "GLUCAP" in the last 168 hours.  Discharge time spent: less than 30 minutes.  Signed: Annita Brod, MD [facility] 02/15/2022

## 2022-02-12 NOTE — TOC Initial Note (Signed)
Transition of Care [facility]) - Initial/Assessment Note    Patient Details  Name: Samuel Choi MRN: [id number] Date of Birth: 17-Apr-1966  Transition of Care [facility]) CM/SW Contact:    Magnus Ivan, LCSW Phone Number: 02/12/2022, 11:31 AM  Clinical Narrative:                 CSW spoke with patient by phone due to isolation for high risk assessment. Patient lives with his wife. At times he drives to appointments, sometimes wife takes him. PCP is Dr. Ginette Pitman. Has been going to Dr. Tasia Catchings for Oncology. Pharmacy is [facility] on [address]. Patient declines [place], DME, or SNF history.    Expected Discharge Plan: Home/Self Care Barriers to Discharge: Continued Medical Work up   Patient Goals and CMS Choice Patient states their goals for this hospitalization and ongoing recovery are:: home with wife CMS Medicare.gov Compare Post Acute Care list provided to:: Patient Choice offered to / list presented to : Patient  Expected Discharge Plan and Services Expected Discharge Plan: Home/Self Care       Living arrangements for the past 2 months: Single Family Home                                      Prior Living Arrangements/Services Living arrangements for the past 2 months: Single Family Home Lives with:: Spouse Patient language and need for interpreter reviewed:: Yes Do you feel safe going back to the place where you live?: Yes      Need for Family Participation in Patient Care: Yes (Comment) Care giver support system in place?: Yes (comment)   Criminal Activity/Legal Involvement Pertinent to Current Situation/Hospitalization: No - Comment as needed  Activities of Daily Living Home Assistive Devices/Equipment: None ADL Screening (condition at time of admission) Patient's cognitive ability adequate to safely complete daily activities?: Yes Is the patient deaf or have difficulty hearing?: No Does the patient have difficulty seeing, even when wearing glasses/contacts?: No Does the  patient have difficulty concentrating, remembering, or making decisions?: No Patient able to express need for assistance with ADLs?: Yes Does the patient have difficulty dressing or bathing?: No Independently performs ADLs?: Yes (appropriate for developmental age) Does the patient have difficulty walking or climbing stairs?: No Weakness of Legs: Both Weakness of Arms/Hands: Both  Permission Sought/Granted                  Emotional Assessment       Orientation: : Oriented to Self, Oriented to Place, Oriented to  Time, Oriented to Situation Alcohol / Substance Use: Not Applicable Psych Involvement: No (comment)  Admission diagnosis:  Acute respiratory failure ([place]) [J96.00] Acute respiratory failure with hypoxia ([place]) [J96.01] Patient Active Problem List   Diagnosis Date Noted   Cavitary lesion of lung 02/11/2022   Acute respiratory failure ([place]) 02/10/2022   Lactic acidosis 02/10/2022   SVT (supraventricular tachycardia) ([place]) 02/02/2022   Sepsis ([place]) 02/01/2022   Hyponatremia 02/01/2022   Chronic diastolic CHF (congestive heart failure) ([place]) 02/01/2022   Macrocytic anemia 02/01/2022   Elevated serum creatinine 01/25/2022   Diverticulosis of colon 11/18/2021   History of thrombosis 11/18/2021   Weight loss 11/09/2021   C. difficile colitis 10/19/2021   Hyperkalemia 07/28/2021   Severe protein-calorie malnutrition ([place]) 06/08/2021   Antineoplastic chemotherapy induced anemia 06/08/2021   Pulmonary embolus ([place]) 06/08/2021   Chemotherapy induced nausea and vomiting 05/17/2021  Symptomatic anemia 05/17/2021   Wide-complex tachycardia    Chemotherapy induced neutropenia ([place]) 11/09/2020   COPD (chronic obstructive pulmonary disease) ([place]) 04/22/2020   Sinus tachycardia 12/18/2019   Hypomagnesemia 12/10/2019   Encounter for antineoplastic immunotherapy 11/24/2019   Ex-smoker 07/23/2019   Port-A-Cath in place 04/04/2019   Thrombocytopenia ([place]) 04/04/2019   Folate  deficiency 04/04/2019   Hypokalemia 04/04/2019   B12 deficiency 04/04/2019   Alcohol abuse 04/04/2019   Dehydration 02/07/2019   Encounter for antineoplastic chemotherapy 01/31/2019   Squamous cell carcinoma of right lung ([place]) 09/03/2018   Goals of care, counseling/discussion 09/03/2018   Malnutrition of moderate degree 08/23/2018   Emphysema of lung ([place]) 06/2018   Allergic rhinitis 06/2018   H/O nasal polyp 02/20/2018   Acute sinusitis 10/10/2017   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/10/2017   Chronic rhinitis 10/10/2017   Mass of sinus 10/10/2017   Essential hypertension 09/27/2017   Tobacco abuse 09/27/2017   PCP:  Earlie Server, MD Pharmacy:   [id number] [address] [place] [address] Phone: [phone] Fax: [phone]     Social Determinants of Health (SDOH) Interventions    Readmission Risk Interventions    02/12/2022   11:30 AM  Readmission Risk Prevention Plan  Transportation Screening Complete  PCP or Specialist Appt within 3-5 Days Complete  HRI or [place] Complete  Social Work Consult for [employer] Planning/Counseling Complete  Palliative Care Screening Not Applicable  Medication Review Press photographer) Complete

## 2022-02-12 NOTE — Progress Notes (Signed)
Pt refused to ambulate for qualify sats, MD made aware

## 2022-02-12 NOTE — Progress Notes (Signed)
SATURATION QUALIFICATIONS: (This note is used to comply with regulatory documentation for home oxygen)  Patient Saturations on Room Air at Rest = 87%   Patient Saturations on 1 Liters of oxygen while Ambulating = 93% at rest  Please briefly explain why patient needs home oxygen:

## 2022-02-12 NOTE — TOC Transition Note (Signed)
Transition of Care [facility]) - CM/SW Discharge Note   Patient Details  Name: Samuel Choi MRN: [id number] Date of Birth: October 14, 1965  Transition of Care [facility]) CM/SW Contact:  Valente David, RN Phone Number: 02/12/2022, 3:20 PM   Clinical Narrative:    Notified that patient will discharge home with family today.  Will need home oxygen.  [place] with [employer] aware.  No further needs identified.    Final next level of care: Home/Self Care Barriers to Discharge: Barriers Resolved   Patient Goals and CMS Choice Patient states their goals for this hospitalization and ongoing recovery are:: Home with wife/family CMS Medicare.gov Compare Post Acute Care list provided to:: Patient Choice offered to / list presented to : Patient  Discharge Placement                       Discharge Plan and Services                DME Arranged: Oxygen DME Agency: [employer] Date DME Agency Contacted: 02/12/22 Time DME Agency Contacted: 1519 Representative spoke with at DME Agency: [place] ([place]) Interventions     Readmission Risk Interventions    02/12/2022   11:30 AM  Readmission Risk Prevention Plan  Transportation Screening Complete  PCP or Specialist Appt within 3-5 Days Complete  HRI or [place] Complete  Social Work Consult for [place] Planning/Counseling Complete  Palliative Care Screening Not Applicable  Medication Review Press photographer) Complete

## 2022-02-14 ENCOUNTER — Other Ambulatory Visit: Payer: Self-pay

## 2022-02-14 DIAGNOSIS — C3491 Malignant neoplasm of unspecified part of right bronchus or lung: Secondary | ICD-10-CM

## 2022-02-14 MED FILL — Dexamethasone Sodium Phosphate Inj 100 MG/10ML: INTRAMUSCULAR | Qty: 1 | Status: AC

## 2022-02-15 ENCOUNTER — Ambulatory Visit
Admission: RE | Admit: 2022-02-15 | Discharge: 2022-02-15 | Disposition: A | Discharge status: Home / Self Care | Payer: 59 | Source: Ambulatory Visit | Attending: Oncology | Admitting: Oncology

## 2022-02-15 ENCOUNTER — Inpatient Hospital Stay (HOSPITAL_BASED_OUTPATIENT_CLINIC_OR_DEPARTMENT_OTHER): Payer: 59 | Admitting: Oncology

## 2022-02-15 ENCOUNTER — Inpatient Hospital Stay: Payer: 59

## 2022-02-15 ENCOUNTER — Encounter: Payer: Self-pay | Admitting: Oncology

## 2022-02-15 VITALS — BP 130/74 | HR 98 | Temp 96.8°F | Ht 73.0 in | Wt 128.0 lb

## 2022-02-15 DIAGNOSIS — Z95828 Presence of other vascular implants and grafts: Secondary | ICD-10-CM

## 2022-02-15 DIAGNOSIS — M7989 Other specified soft tissue disorders: Secondary | ICD-10-CM | POA: Insufficient documentation

## 2022-02-15 DIAGNOSIS — C3491 Malignant neoplasm of unspecified part of right bronchus or lung: Secondary | ICD-10-CM

## 2022-02-15 DIAGNOSIS — E876 Hypokalemia: Secondary | ICD-10-CM

## 2022-02-15 DIAGNOSIS — C3411 Malignant neoplasm of upper lobe, right bronchus or lung: Secondary | ICD-10-CM | POA: Diagnosis not present

## 2022-02-15 DIAGNOSIS — T451X5A Adverse effect of antineoplastic and immunosuppressive drugs, initial encounter: Secondary | ICD-10-CM

## 2022-02-15 DIAGNOSIS — E43 Unspecified severe protein-calorie malnutrition: Secondary | ICD-10-CM

## 2022-02-15 DIAGNOSIS — D6481 Anemia due to antineoplastic chemotherapy: Secondary | ICD-10-CM

## 2022-02-15 DIAGNOSIS — Z86718 Personal history of other venous thrombosis and embolism: Secondary | ICD-10-CM

## 2022-02-15 LAB — CBC WITH DIFFERENTIAL/PLATELET
Abs Immature Granulocytes: 0.27 10*3/uL — ABNORMAL HIGH (ref 0.00–0.07)
Basophils Absolute: 0.1 10*3/uL (ref 0.0–0.1)
Basophils Relative: 0 %
Eosinophils Absolute: 0 10*3/uL (ref 0.0–0.5)
Eosinophils Relative: 0 %
HCT: 24.6 % — ABNORMAL LOW (ref 39.0–52.0)
Hemoglobin: 8.5 g/dL — ABNORMAL LOW (ref 13.0–17.0)
Immature Granulocytes: 1 %
Lymphocytes Relative: 1 %
Lymphs Abs: 0.3 10*3/uL — ABNORMAL LOW (ref 0.7–4.0)
MCH: 36.3 pg — ABNORMAL HIGH (ref 26.0–34.0)
MCHC: 34.6 g/dL (ref 30.0–36.0)
MCV: 105.1 fL — ABNORMAL HIGH (ref 80.0–100.0)
Monocytes Absolute: 1.1 10*3/uL — ABNORMAL HIGH (ref 0.1–1.0)
Monocytes Relative: 4 %
Neutro Abs: 24.8 10*3/uL — ABNORMAL HIGH (ref 1.7–7.7)
Neutrophils Relative %: 94 %
Platelets: 108 10*3/uL — ABNORMAL LOW (ref 150–400)
RBC: 2.34 MIL/uL — ABNORMAL LOW (ref 4.22–5.81)
RDW: 16.7 % — ABNORMAL HIGH (ref 11.5–15.5)
WBC: 26.5 10*3/uL — ABNORMAL HIGH (ref 4.0–10.5)
nRBC: 0.1 % (ref 0.0–0.2)

## 2022-02-15 LAB — COMPREHENSIVE METABOLIC PANEL
ALT: 19 U/L (ref 0–44)
AST: 21 U/L (ref 15–41)
Albumin: 2.8 g/dL — ABNORMAL LOW (ref 3.5–5.0)
Alkaline Phosphatase: 101 U/L (ref 38–126)
Anion gap: 10 (ref 5–15)
BUN: 11 mg/dL (ref 6–20)
CO2: 27 mmol/L (ref 22–32)
Calcium: 8.5 mg/dL — ABNORMAL LOW (ref 8.9–10.3)
Chloride: 102 mmol/L (ref 98–111)
Creatinine, Ser: 0.88 mg/dL (ref 0.61–1.24)
GFR, Estimated: >60 mL/min (ref >60)
Glucose, Bld: 125 mg/dL — ABNORMAL HIGH (ref 70–99)
Potassium: 4 mmol/L (ref 3.5–5.1)
Sodium: 139 mmol/L (ref 135–145)
Total Bilirubin: 0.4 mg/dL (ref 0.3–1.2)
Total Protein: 6.1 g/dL — ABNORMAL LOW (ref 6.5–8.1)

## 2022-02-15 LAB — CULTURE, BLOOD (ROUTINE X 2)
Culture: NO GROWTH
Culture: NO GROWTH
Special Requests: ADEQUATE
Special Requests: ADEQUATE

## 2022-02-15 LAB — PROTEIN, URINE, RANDOM: Total Protein, Urine: 26 mg/dL

## 2022-02-15 MED ORDER — ALTEPLASE 2 MG IJ SOLR
2.0000 mg | Freq: Once | INTRAMUSCULAR | Status: AC | PRN
  Administered 2022-02-15: 2 mg
  Filled 2022-02-15: qty 2

## 2022-02-15 MED ORDER — HEPARIN SOD (PORK) LOCK FLUSH 100 UNIT/ML IV SOLN
500.0000 [IU] | Freq: Once | INTRAVENOUS | Status: AC
  Administered 2022-02-15: 500 [IU] via INTRAVENOUS
  Filled 2022-02-15: qty 5

## 2022-02-15 MED ORDER — APIXABAN 5 MG PO TABS
5.0000 mg | ORAL_TABLET | Freq: Two times a day (BID) | ORAL | 2 refills | Status: DC
Start: 2022-02-15 — End: 2022-06-26

## 2022-02-15 MED ORDER — SODIUM CHLORIDE 0.9% FLUSH
10.0000 mL | INTRAVENOUS | Status: DC | PRN
  Administered 2022-02-15: 10 mL via INTRAVENOUS
  Filled 2022-02-15: qty 10

## 2022-02-15 NOTE — Assessment & Plan Note (Signed)
Hemoglobin is stable. monitor

## 2022-02-15 NOTE — Progress Notes (Signed)
No blood return with port access this AM. Labs collected peripherally and Cathflo instilled at 0920. Dr. Tasia Catchings and team made aware.

## 2022-02-15 NOTE — Progress Notes (Signed)
Per Janeann Merl, RN per Dr. Tasia Catchings, no treatment today.   1010: Blood return noted from port. Aspirated blood per Cathflo protocol. Port flushed and deaccessed.

## 2022-02-15 NOTE — Assessment & Plan Note (Signed)
Continue potassium supplementation

## 2022-02-15 NOTE — Assessment & Plan Note (Addendum)
Unprovoked left lower extremity DVT, status post thrombolysis/thrombectomy, Nonocclusive PE  Patient has completed 6+ months of therapeutic dose of Eliquis.  Patient was on Eliquis 2.5 mg p.o. twice daily. Recent CT showed chronic pulmonary embolism. Worsening bilateral lower extremity swelling, I will obtain ultrasound lower extremity to rule out DVT. Recommend patient to increase Eliquis to 5 mg twice daily

## 2022-02-15 NOTE — Progress Notes (Signed)
Hematology/Oncology Progress note [phone] [phone]     Clinic Day:  02/15/2022   Referring physician: Tracie Harrier, MD  ASSESSMENT & PLAN:   Cancer Staging  Squamous cell carcinoma of right lung [facility]) Staging form: Lung, AJCC 8th Edition - Clinical stage from 07/08/2020: Stage IVA (rcT4, cM1a) - Signed by Earlie Server, MD on 07/08/2020    Squamous cell carcinoma of right lung ([place]) 07/16/2020-09/28/2020 carboplatin Taxol and Keytruda x 1  partial response -Keytruda maintenance.-04/22/2021, progression-resumed on carboplatin/Taxol/Keytruda - did not tolerate due to myelosuppression.-->  Taxol held, carboplatin/Keytruda--> CT 07/2020 progression --> 08/03/2021 started docetaxel and ramucirumab.--> Labs are reviewed and discussed with patient. Hold Docetaxel and Ramucirumab.  CT showed Cavitary lesions of the anterior right lower lobe demonstrate increased wall thickening and there is a new irregular solid pulmonary nodule of the left upper lobe.  Etiology unclear, atypical infection versus pneumonitis versus cancer progression. Discussed with Dr. Patsey Berthold who will see patient next week for evaluation.  Most likely needs a biopsy to clarify the etiology.  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  Antineoplastic chemotherapy induced anemia Hemoglobin is stable. monitor  Hypokalemia Continue potassium supplementation  Severe protein-calorie malnutrition ([place]) Patient previously refuses nutrition supplements. Encourage patient to take limitation.   Encourage patient to follow-up with nutritionist  History of thrombosis Unprovoked left lower extremity DVT, status post thrombolysis/thrombectomy, Nonocclusive PE  Patient has completed 6+ months of therapeutic dose of Eliquis.  Patient was on Eliquis 2.5 mg p.o. twice daily. Recent CT showed chronic pulmonary embolism. Worsening bilateral lower extremity  swelling, I will obtain ultrasound lower extremity to rule out DVT. Recommend patient to increase Eliquis to 5 mg twice daily  Follow-up To be determined pending pulmonology workup.  The patient understands the plans discussed today and is in agreement with them.  He knows to contact our office if he develops concerns prior to his next appointment.  Orders Placed This Encounter  Procedures   US Venous Img Lower Bilateral    Standing Status:   Future    Number of Occurrences:   1    Standing Expiration Date:   02/15/2023    Order Specific Question:   Reason for Exam (SYMPTOM  OR DIAGNOSIS REQUIRED)    Answer:   bilateral Leg swelling    Order Specific Question:   Preferred imaging location?    Answer:   [facility]    Earlie Server, MD, PhD [facility] Hematology Oncology 02/15/2022    CHIEF COMPLAINT:  CC: Follow up for lung cancer treatments.   Current Treatment:  Docetaxel Ramucizumab  HISTORY OF PRESENT ILLNESS:   Oncology History  Squamous cell carcinoma of right lung ([place])  08/13/2018 Surgery   s/p Bronchoscopy on 06/25/2018. subcarina EBUS FNA was non diagnostic, hypocellular specimen.  08/13/18 Patient underwent right thoracotomy and wedge resection of a right upper lobe mass.  Frozen section was consistent with an inflammatory process.  On the second postop day, preliminary pathology reports high-grade malignancy.  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  08/20/2018 Patient was therefore taken back to the OR and underwent take complete lobectomy  pT1b pN0 cM0 stage I squamous cell lung cancer. Margin is negative.  Recommend observation.     08/13/2018 Initial Diagnosis   Stage I Squamous cell carcinoma of lung, right ([place])   12/03/2018 Progression   12/03/2018 CT chest showed abnormal soft tissue in the right suprahilar region measuring up to 2.5 cm, worrisome for recurrence  07/15/1094 PET Hypermetabolic RIGHT suprahilar mass consistent with lung cancer,  recurrence. 12/26/2018 s/p bronchoscopy biopsy. Confirmed local recurrence of squamous cell lung cancer.  medi port placed by Dr.Oaks. 01/20/19 MRI brain w/wo contrast: No evidence of metastatic disease   01/17/2019 - 03/07/2019 Chemotherapy   concurrent chemoradiation on 01/16/2019 Carboplatin AUC of 2 and Taxol 45 mg/m2 weekly finished in 03/05/2019    03/31/2019 Imaging   03/31/19 CT chest showed Right suprahilar lesion has decreased slightly in size in the interval since prior PET-CT.  No new or progressive findings on today's study.   04/10/2019 - 05/20/2020 Chemotherapy   04/10/2019, patient started on durvalumab Q 2 weeks maintenance.    06/14/2020 Progression   06/14/2020 CT chest w contrast showed vidence of progressive lymphangitic spread of tumor throughout the right lung, with contralateral lung nodules with  right pleural effusion  07/01/22 MRI brain is negative for CNS involvement.  CT abdomen with contrast showed no evidence of abdominal metastatic disease PET scan was not approved by insurance-peer to peer appeal with Dr.Vipul Bhanderi    07/08/2020 Cancer Staging   Staging form: Lung, AJCC 8th Edition - Clinical stage from 07/08/2020: Stage IVA (rcT4, cM1a) - Signed by Earlie Server, MD on 07/08/2020   07/16/2020 - 09/28/2021 Chemotherapy   Carboplatin + Paclitaxel + Pembrolizumab q21d x 4 cycles      10/15/2020 Imaging   10/15/2020 CT chest abdomen pelvis redemonstrated postoperative and postradiation appearance of the right chest with perihilar consolidation and fibrosis.  Slight interval decrease in size of the pulmonary nodules associated with interlobular septal thickening nearly complete resolution of previously seen left-sided pulmonary nodules.  Minimal irregular residual.  Right pleural effusion improved.  Consistent with treatment response.  No evidence of metastatic disease with the abdomen or pelvis.  None obstructive bilateral nephrolithiasis  - partial response to the  treatment-  12/15/2020, CT chest without contrast showed resolution of lung nodules, no evidence of recurrent disease   10/19/2020 -  Chemotherapy   Keytruda monotherapy maintenance   12/30/2020 [facility] Admission   patient was admitted due to unprovoked extensive deep vein thrombosis from calf vein to the common femoral vein on the left.  Patient was started on heparin drip.  Patient underwent thrombolysis and is a colectomy on 12/31/2020.  Anticoagulation was switched to Eliquis at discharge   04/22/2021 Imaging   CT chest with contrast showed none occlusive pulmonary embolism, Interval progression of consolidation airspace opacity in the medial right middle lobe with bandlike opacity and bronchiectasis in the perihilar right lung,-this is compatible with evolving posttreatment changes although recurrent disease anteriorly is not excluded.Interval development of numerous bilateral pulmonary nodules, measuring up to 11 mm in the right lung base.  Concerning for metastatic disease.  Tiny right pleural effusion.    04/26/2021 -  Chemotherapy   resumed on carboplatin/Taxol/Keytruda. Patient did not tolerate his chemotherapy of carboplatin/Taxol/Keytruda on 04/26/2021. Patient has developed severe anemia status post PRBC transfusion. 05/25/2021, carboplatin AUC 5 with Keytruda 06/23/2021  continued on carboplatin AUC 5 with Keytruda 07/07/2021 carboplatin with [name]      07/15/2021 Progression   07/15/2021 CT chest with contrast showed enlarged right lower lobe thick-walled cavitary lesion in the anterior aspect of the right lower lobe.  Concerning for progression.  Multiple other small pulmonary nodules are also noted elsewhere in the lungs, many of which are stable compared to prior studies.  Several are new or clearly enlarged.  The best example is an enlarging pulmonary  nodule in the left upper lobe, 8 x 6 mm comparing to 4 x 3 mm on 04/22/2021   08/03/2021 -  Chemotherapy   Patient is on  Treatment Plan :  LUNG Docetaxel + Ramucirumab q21d      11/16/2021 Imaging   1. The previously noted thick-walled cavity of concern in the right lower lobe now appears more simple and thin-walled in appearance, presumably a post infectious cavity. There is a new adjacent thick-walled cavity in the right lower lobe which is aggressive in appearance, but given the evolution of the previous lesion, this may also be of infectious etiology. 2. However, there is an enlarging mixed cystic and solid nodule in the left upper lobe which is concerning for metastatic lesion or new primary lesion. This currently measures 11 x 5 x 10 mm. Close attention on follow-up studies is recommended. 3. No definite signs of metastatic disease to the abdomen or pelvis. 4. Extensive colonic diverticulosis most involved in the sigmoid colon where there appear to be several small diverticular abscesses. one of these appears to fistulized into the wall of the urinary bladder, without frank fistulization to the lumen of the urinary bladder at this time.      During the interval, patient was admitted twice.  See above. Today patient reports feeling at baseline.  Chronic shortness of breath, unchanged.  He is on nasal cannula oxygen.  Denies any pain, nausea vomiting, fever.  He has gained weight  REVIEW OF SYSTEMS:  Review of Systems  Constitutional:  Positive for fatigue. Negative for appetite change, chills, fever and unexpected weight change.  HENT:   Negative for hearing loss and voice change.   Eyes:  Negative for eye problems and icterus.  Respiratory:  Positive for shortness of breath. Negative for chest tightness and cough.   Cardiovascular:  Negative for chest pain and leg swelling.  Gastrointestinal:  Negative for abdominal distention, abdominal pain and blood in stool.  Endocrine: Negative for hot flashes.  Genitourinary:  Negative for difficulty urinating, dysuria and frequency.   Musculoskeletal:  Negative for  arthralgias.  Skin:  Negative for itching and rash.  Neurological:  Negative for extremity weakness, light-headedness and numbness.  Hematological:  Negative for adenopathy. Does not bruise/bleed easily.  Psychiatric/Behavioral:  Negative for confusion.       VITALS:  Blood pressure 130/74, pulse 98, temperature (!) 96.8 F (36 C), temperature source Tympanic, height _0  (1.854 m), weight 128 lb (58.1 kg), SpO2 96 %.  Wt Readings from Last 3 Encounters:  02/15/22 128 lb (58.1 kg)  02/10/22 127 lb 13.9 oz (58 kg)  02/10/22 128 lb 4.8 oz (58.2 kg)    Body mass index is 16.89 kg/m.  Performance status (ECOG): 1 - Symptomatic but completely ambulatory  PHYSICAL EXAM:  Physical Exam HENT:     Head: Normocephalic and atraumatic.  Eyes:     General: No scleral icterus. Cardiovascular:     Rate and Rhythm: Normal rate.  Pulmonary:     Effort: Pulmonary effort is normal.     Breath sounds: No wheezing or rales.     Comments: Decreased breath sound bilaterally Patient is on nasal cannula oxygen. Abdominal:     General: There is no distension.     Palpations: Abdomen is soft.     Tenderness: There is no abdominal tenderness.  Musculoskeletal:        General: No swelling.     Cervical back: Normal range of motion.  Skin:  General: Skin is warm and dry.  Neurological:     General: No focal deficit present.     Mental Status: He is alert. Mental status is at baseline.  Psychiatric:        Mood and Affect: Mood normal.     LABS:      Latest Ref Rng & Units 02/15/2022    8:52 AM 02/12/2022    5:00 AM 02/11/2022   11:55 AM  CBC  WBC 4.0 - 10.5 K/uL 26.5  37.0  29.4   Hemoglobin 13.0 - 17.0 g/dL 8.5  8.3  8.8   Hematocrit 39.0 - 52.0 % 24.6  24.1  26.5   Platelets 150 - 400 K/uL 108  77  79       Latest Ref Rng & Units 02/15/2022    8:52 AM 02/11/2022   11:55 AM 02/10/2022   10:35 AM  CMP  Glucose 70 - 99 mg/dL 125  117  151   BUN 6 - 20 mg/dL _0 Creatinine 0.61  - 1.24 mg/dL 0.88  0.90  1.05   Sodium 135 - 145 mmol/L 139  139  139   Potassium 3.5 - 5.1 mmol/L 4.0  3.5  3.5   Chloride 98 - 111 mmol/L 102  106  106   CO2 22 - 32 mmol/L _1 Calcium 8.9 - 10.3 mg/dL 8.5  8.4  7.9   Total Protein 6.5 - 8.1 g/dL 6.1     Total Bilirubin 0.3 - 1.2 mg/dL 0.4     Alkaline Phos 38 - 126 U/L 101     AST 15 - 41 U/L 21     ALT 0 - 44 U/L 19        HISTORY:   Past Medical History:  Diagnosis Date   Alcohol abuse    usually drinks 2-3 drinks per day   Atherosclerosis 06/2018   Chronic sinusitis    Dehydration 02/07/2019   Emphysema of lung ([place]) 06/2018   patient unaware of this.   Hip fracture ([place]) 05/2018   no surgery   History of kidney stones 05/2018   per xray, bilateral nephrolitiasis   Hypertension    Squamous cell carcinoma of lung, right ([place]) 06/2018    Past Surgical History:  Procedure Laterality Date   BRAIN SURGERY  10/2017   nasal/sinus endoscopy. mass benign   ELECTROMAGNETIC NAVIGATION BROCHOSCOPY Right 06/25/2018   Procedure: ELECTROMAGNETIC NAVIGATION BRONCHOSCOPY;  Surgeon: [name], MD;  Location: [facility] OR[facility];  Service: Cardiopulmonary;  Laterality: Right;   ENDOBRONCHIAL ULTRASOUND Right 06/25/2018   Procedure: ENDOBRONCHIAL ULTRASOUND;  Surgeon: [name], MD;  Location: [facility];  Service: Cardiopulmonary;  Laterality: Right;   ENDOBRONCHIAL ULTRASOUND Right 12/26/2018   Procedure: ENDOBRONCHIAL ULTRASOUND RIGHT;  Surgeon: [name], MD;  Location: [facility];  Service: Cardiopulmonary;  Laterality: Right;   FLEXIBLE BRONCHOSCOPY N/A 08/20/2018   Procedure: FLEXIBLE BRONCHOSCOPY PREOP;  Surgeon: [name], MD;  Location: [facility] OR[facility];  Service: Thoracic;  Laterality: N/A;   IR CV LINE INJECTION  04/04/2019   NASAL SINUS SURGERY  10/2017   At [facility], frontal sinusotomy, ethmoidectomy, resection anterior cranial fossa neoplasm, turbinate resection   PERIPHERAL VASCULAR THROMBECTOMY Left 12/31/2020   Procedure:  PERIPHERAL VASCULAR THROMBECTOMY;  Surgeon: Algernon Huxley, MD;  Location: [place] [facility];  Service: Cardiovascular;  Laterality: Left;   PORTACATH PLACEMENT Left 01/15/2019   Procedure: INSERTION PORT-A-CATH;  Surgeon: [name],  MD;  Location: [facility];  Service: General;  Laterality: Left;   THORACOTOMY Right 08/13/2018   Procedure: PREOP BROCHOSCOPY WITH RIGHT THORACOTOMY AND RUL RESECTION;  Surgeon: [name], MD;  Location: [facility];  Service: General;  Laterality: Right;   THORACOTOMY Right 08/20/2018   Procedure: THORACOTOMY MAJOR RIGHT UPPER LOBE LOBECTOMY;  Surgeon: [name], MD;  Location: [facility];  Service: Thoracic;  Laterality: Right;   TOE SURGERY Left    pin in left toe    Family History  Problem Relation Age of Onset   Breast cancer Mother    Diabetes Mother    Lung cancer Father    Hypertension Father     Social History:  reports that he quit smoking about 3 years ago. His smoking use included cigarettes. He has a 7.50 pack-year smoking history. He has never used smokeless tobacco. He reports current alcohol use of about 3.0 standard drinks of alcohol per week. He reports that he does not use drugs.  Allergies: No Known Allergies  Current Medications: Current Outpatient Medications  Medication Sig Dispense Refill   albuterol (VENTOLIN HFA) 108 (90 Base) MCG/ACT inhaler INHALE 2 PUFFS BY MOUTH EVERY 6 HOURS AS NEEDED FOR WHEEZE OR SHORTNESS OF BREATH 8.5 g 5   apixaban (ELIQUIS) 5 MG TABS tablet Take 1 tablet (5 mg total) by mouth 2 (two) times daily. 60 tablet 2   benzonatate (TESSALON) 100 MG capsule Take 1 capsule (100 mg total) by mouth 3 (three) times daily as needed for cough. 20 capsule 0   budesonide-formoterol (SYMBICORT) 80-4.5 MCG/ACT inhaler Inhale 2 puffs into the lungs 2 (two) times daily. in the morning and at bedtime. 10.2 each 12   diltiazem (CARDIZEM CD) 120 MG 24 hr capsule Take 120 mg by mouth daily.     feeding supplement (ENSURE  ENLIVE / ENSURE PLUS) LIQD Take 237 mLs by mouth 3 (three) times daily between meals. 237 mL 12   HYDROcodone-acetaminophen (NORCO/VICODIN) 5-325 MG tablet Take 1 tablet by mouth every 8 (eight) hours as needed for severe pain. 30 tablet 0   latanoprost (XALATAN) 0.005 % ophthalmic solution Place 1 drop into both eyes at bedtime.     loratadine (CLARITIN) 10 MG tablet Take 10 mg by mouth daily.     methocarbamol (ROBAXIN) 500 MG tablet Take 1 tablet (500 mg total) by mouth at bedtime. 30 tablet 0   metoprolol succinate (TOPROL-XL) 25 MG 24 hr tablet Take 1 tablet (25 mg total) by mouth daily. 30 tablet 0   Multiple Vitamin (MULTIVITAMIN WITH MINERALS) TABS tablet Take 1 tablet by mouth daily.     ondansetron (ZOFRAN) 8 MG tablet Take 1 tablet (8 mg total) by mouth every 8 (eight) hours as needed for refractory nausea / vomiting. Start on day 3 after chemo. 90 tablet 1   pantoprazole (PROTONIX) 20 MG tablet TAKE 1 TABLET(20 MG) BY MOUTH DAILY 60 tablet 2   potassium chloride (KLOR-CON) 10 MEQ tablet Take 10 mEq by mouth daily. Start taking 01/14/22     vitamin B-12 (CYANOCOBALAMIN) 1000 MCG tablet Take 1 tablet (1,000 mcg total) by mouth daily. 90 tablet 1   SPIRIVA HANDIHALER 18 MCG inhalation capsule INHALE 1 CAPSULE VIA HANDIHALER ONCE DAILY AT THE SAME TIME EVERY DAY (Patient not taking: Reported on 02/10/2022) 30 capsule 0   No current facility-administered medications for this visit.   Facility-Administered Medications Ordered in Other Visits  Medication Dose Route Frequency Provider Last Rate Last Admin  dexamethasone (DECADRON) 10 MG/ML injection            heparin lock flush 100 unit/mL  500 Units Intravenous Once Earlie Server, MD       heparin lock flush 100 unit/mL  500 Units Intravenous Once Earlie Server, MD       sodium chloride flush (NS) 0.9 % injection 10 mL  10 mL Intravenous PRN Earlie Server, MD   10 mL at 04/04/19 0903   sodium chloride flush (NS) 0.9 % injection 10 mL  10 mL Intravenous PRN  Earlie Server, MD   10 mL at 07/26/20 1308   sodium chloride flush (NS) 0.9 % injection 10 mL  10 mL Intravenous PRN Earlie Server, MD   10 mL at 07/28/21 0828       IMAGE STUDIES:  US Venous Img Lower Bilateral  Result Date: 02/15/2022 CLINICAL DATA:  Bilateral leg swelling. EXAM: BILATERAL LOWER EXTREMITY VENOUS DOPPLER ULTRASOUND TECHNIQUE: Gray-scale sonography with compression, as well as color and duplex ultrasound, were performed to evaluate the deep venous system(s) from the level of the common femoral vein through the popliteal and proximal calf veins. COMPARISON:  None Available. FINDINGS: VENOUS Normal compressibility of the common femoral, superficial femoral, and popliteal veins, as well as the visualized calf veins. Visualized portions of profunda femoral vein and great saphenous vein unremarkable. No filling defects to suggest DVT on grayscale or color Doppler imaging. Doppler waveforms show normal direction of venous flow, normal respiratory plasticity and response to augmentation. Limited views of the contralateral common femoral vein are unremarkable. OTHER None. Limitations: none IMPRESSION: Negative. Electronically Signed   By: Fidela Salisbury M.D.   On: 02/15/2022 15:13   CT CHEST WO CONTRAST  Result Date: 02/10/2022 CLINICAL DATA:  Pneumonia suspected EXAM: CT CHEST WITHOUT CONTRAST TECHNIQUE: Multidetector CT imaging of the chest was performed following the standard protocol without IV contrast. RADIATION DOSE REDUCTION: This exam was performed according to the departmental dose-optimization program which includes automated exposure control, adjustment of the mA and/or kV according to patient size and/or use of iterative reconstruction technique. COMPARISON:  Chest CT dated February 01, 2022 FINDINGS: Cardiovascular: Mild cardiomegaly. Small pericardial effusion, similar to prior exam. Atherosclerotic disease of the thoracic aorta. Moderate coronary artery calcifications. Left chest wall  port with tip in the mid SVC. Mediastinum/Nodes: Thyroid is unremarkable. Esophagus is unremarkable. No pathologically enlarged lymph nodes seen in the chest. Lungs/Pleura: Moderate centrilobular emphysema. Similar bilateral mosaic attenuation.Right paramediastinal postradiation change, stable when compared with prior exam. Cavitary lesions of the anterior right lower lobe located on series 3, image 77 demonstrate increased wall thickening when compared with prior exam, reference cavitary lesion measuring 19 x 16 mm on series 3, image 77, previously 15 x 13 mm. New irregular solid nodule of the left upper lobe measures 11 x 7 mm on image 49. Previously seen clustered centrilobular nodules of the posterior right lower lobe have resolved. Upper Abdomen: No acute abnormality. Musculoskeletal: No chest wall mass or suspicious bone lesions identified. IMPRESSION: 1. Cavitary lesions of the anterior right lower lobe demonstrate increased wall thickening and there is a new irregular solid pulmonary nodule of the left upper lobe. 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 2. Stable right paramediastinal postradiation change. 3. Similar bilateral mosaic attenuation, findings can be seen in setting of small airways disease or pulmonary hypertension. 4. Aortic Atherosclerosis (  ICD10-I70.0) and Emphysema (ICD10-J43.9). Electronically Signed   By: Yetta Glassman M.D.   On: 02/10/2022 15:16   DG Chest 2 View  Result Date: 02/10/2022 CLINICAL DATA:  Shortness of breath EXAM: CHEST - 2 VIEW COMPARISON:  02/01/2022 FINDINGS: Left chest port with catheter tip approximating the SVC. Normal cardiac and mediastinal contours. Again noted is decreased volume in the right lung with unchanged linear densities in the right parahilar region and right lower lung, likely scarring.  Blunting of the right costophrenic angle, chronic, likely pleural thickening. Normal left costophrenic angle. Slightly increased opacity in the right mid lung, possibly related to the known cavitary lesions in this region. The left lung is clear. No pneumothorax. IMPRESSION: Slightly increased opacity in the right mid lung, which is nonspecific but may be infectious or related to the cavitary process in this region. Electronically Signed   By: Merilyn Baba M.D.   On: 02/10/2022 11:00   ECHOCARDIOGRAM COMPLETE  Result Date: 02/04/2022    ECHOCARDIOGRAM REPORT   Patient Name:   Samuel Choi Date of Exam: 02/03/2022 Medical Rec #:  [id number]      Height:       73.0 in Accession #:    [id number]     Weight:       128.1 lb Date of Birth:  [birth year]       BSA:          1.780 m Patient Age:    56 years       BP:           123/87 mmHg Patient Gender: M              HR:           98 bpm. Exam Location:  [facility] Procedure: 2D Echo, Cardiac Doppler and Color Doppler Indications:     SVT (supraventricular tachycardia) ([place]) [[id number]]                  Pulmonary embolism ([place]) [[id number]]  History:         Patient has prior history of Echocardiogram examinations, most                  recent 01/01/2021. Risk Factors:Hypertension and ETOH.  Sonographer:     Bernadene Person RDCS Referring Phys:  [id number] Alanson Puls TANG Diagnosing Phys: Isaias Cowman MD  Sonographer Comments: Image acquisition challenging due to respiratory motion. IMPRESSIONS  1. Left ventricular ejection fraction, by estimation, is 50 to 55%. The left ventricle has low normal function. The left ventricle has no regional wall motion abnormalities. There is mild left ventricular hypertrophy. Left ventricular diastolic parameters are consistent with Grade I diastolic dysfunction (impaired relaxation).  2. Right ventricular systolic function is normal. The right ventricular size is mildly enlarged. There is severely elevated pulmonary artery systolic pressure.   3. Right atrial size was moderately dilated.  4. The mitral valve is normal in structure. No evidence of mitral valve regurgitation. No evidence of mitral stenosis.  5. Tricuspid valve regurgitation is moderate to severe.  6. The aortic valve is normal in structure. Aortic valve regurgitation is not visualized. No aortic stenosis is present.  7. The inferior vena cava is normal in size with greater than 50% respiratory variability, suggesting right atrial pressure of 3 mmHg. FINDINGS  Left Ventricle: Left ventricular ejection fraction, by estimation, is 50 to 55%. The left ventricle has low normal function. The left ventricle has no regional wall motion abnormalities. The  left ventricular internal cavity size was normal in size. There is mild left ventricular hypertrophy. Left ventricular diastolic parameters are consistent with Grade I diastolic dysfunction (impaired relaxation). Right Ventricle: The right ventricular size is mildly enlarged. No increase in right ventricular wall thickness. Right ventricular systolic function is normal. There is severely elevated pulmonary artery systolic pressure. The tricuspid regurgitant velocity is 4.41 m/s, and with an assumed right atrial pressure of 3 mmHg, the estimated right ventricular systolic pressure is 25.8 mmHg. Left Atrium: Left atrial size was normal in size. Right Atrium: Right atrial size was moderately dilated. Pericardium: There is no evidence of pericardial effusion. Mitral Valve: The mitral valve is normal in structure. No evidence of mitral valve regurgitation. No evidence of mitral valve stenosis. Tricuspid Valve: The tricuspid valve is normal in structure. Tricuspid valve regurgitation is moderate to severe. No evidence of tricuspid stenosis. Aortic Valve: The aortic valve is normal in structure. Aortic valve regurgitation is not visualized. No aortic stenosis is present. Pulmonic Valve: The pulmonic valve was normal in structure. Pulmonic valve  regurgitation is not visualized. No evidence of pulmonic stenosis. Aorta: The aortic root is normal in size and structure. Venous: The inferior vena cava is normal in size with greater than 50% respiratory variability, suggesting right atrial pressure of 3 mmHg. IAS/Shunts: No atrial level shunt detected by color flow Doppler.  LEFT VENTRICLE PLAX 2D LVIDd:         4.00 cm   Diastology LVIDs:         2.95 cm   LV e' medial:    6.92 cm/s LV PW:         1.07 cm   LV E/e' medial:  9.8 LV IVS:        1.10 cm   LV e' lateral:   7.10 cm/s LVOT diam:     2.10 cm   LV E/e' lateral: 9.5 LV SV:         55 LV SV Index:   31 LVOT Area:     3.46 cm  RIGHT VENTRICLE RV S prime:     12.20 cm/s TAPSE (M-mode): 1.8 cm LEFT ATRIUM           Index        RIGHT ATRIUM           Index LA diam:      3.50 cm 1.97 cm/m   RA Area:     24.60 cm LA Vol (A2C): 30.6 ml 17.19 ml/m  RA Volume:   90.80 ml  51.00 ml/m LA Vol (A4C): 26.1 ml 14.66 ml/m  AORTIC VALVE LVOT Vmax:   115.00 cm/s LVOT Vmean:  73.900 cm/s LVOT VTI:    0.158 m  AORTA Ao Root diam: 3.30 cm Ao Asc diam:  3.20 cm MITRAL VALVE               TRICUSPID VALVE MV Area (PHT): 4.19 cm    TR Peak grad:   77.8 mmHg MV Decel Time: 181 msec    TR Vmax:        441.00 cm/s MV E velocity: 67.70 cm/s MV A velocity: 78.00 cm/s  SHUNTS MV E/A ratio:  0.87        Systemic VTI:  0.16 m                            Systemic Diam: 2.10 cm Isaias Cowman MD Electronically signed by Isaias Cowman  MD Signature Date/Time: 02/04/2022/2:59:37 PM    Final    CT Angio Chest PE W and/or Wo Contrast  Result Date: 02/01/2022 CLINICAL DATA:  Lung cancer.  Shortness of breath and hypoxia. * Tracking Code: BO * EXAM: CT ANGIOGRAPHY CHEST WITH CONTRAST TECHNIQUE: Multidetector CT imaging of the chest was performed using the standard protocol during bolus administration of intravenous contrast. Multiplanar CT image reconstructions and MIPs were obtained to evaluate the vascular anatomy.  RADIATION DOSE REDUCTION: This exam was performed according to the departmental dose-optimization program which includes automated exposure control, adjustment of the mA and/or kV according to patient size and/or use of iterative reconstruction technique. CONTRAST:  85m OMNIPAQUE IOHEXOL 350 MG/ML SOLN COMPARISON:  PET-CT 11/21/2021 and chest CT 11/15/2021 FINDINGS: Cardiovascular: Left Port-A-Cath tip: Distal SVC. Possible fibrin sheath distally. Scattered findings of chronic pulmonary embolus. This includes in the left pulmonary artery on image 185 where there are peripheral string like filling defects; the peripheral chronic filling defect along the margin of the right lower lobe pulmonary artery on image 213 series 7; an adjacent web-like filling defect in a segmental branch of the right lower lobe pulmonary artery on image 212 of series 7; and a web like filling defect at the origin of a left lower lobe segmental branch on image 260 series 7. These all have characteristic appearance of chronic embolus, and I do not discern current findings of acute pulmonary embolus or substantial worsening of appearance compared to prior exams. Mild prominence the main pulmonary artery at 3.7 cm diameter, potentially reflecting pulmonary arterial hypertension. Coronary, aortic arch, and branch vessel atherosclerotic vascular disease. Mediastinum/Nodes: No pathologic adenopathy. Lungs/Pleura: Right upper lobectomy. Stable radiation therapy related findings in the right perihilar region. Improved airspace opacity around peripheral cavitary process in the right lower lobe, currently the cavity or air cyst measures 1.4 by 1.4 cm on image 86 of series 6 and has wall thickness of about 1-2 mm. Similarly mild wall thickening associated with a second bulla or small cavity more medially on image 87 series 6. Patchy peripheral mainly tree-in-bud reticulonodular opacities in the posterior basal segment right lower lobe, images 95  through 135 series 6, favoring atypical infection. Airway thickening and some airway plugging in the right lower lobe. Mosaic attenuation in both lungs. Underlying emphysema. Mild tree-in-bud reticulonodular opacities increased in the posterior basal segment left lower lobe and in the lingula. Upper Abdomen: Unremarkable Musculoskeletal: Unremarkable Review of the MIP images confirms the above findings. IMPRESSION: 1. Bilateral chronic pulmonary embolus but no acute pulmonary embolus. 2. Probable pulmonary arterial hypertension. 3.  Aortic Atherosclerosis (ICD10-I70.0).  Coronary atherosclerosis. 4. Improved airspace opacity along the peripheral cavitary process in the right lower lobe, currently the thin walled air cyst or cavity measures about 1.4 cm in diameter. 5. Increased peripheral tree-in-bud reticulonodular opacities in both lower lobes in the lingula, compatible with atypical infection. 6. Airway thickening and airway plugging particularly in the right lower lobe. 7. Mosaic attenuation in the lungs, stable but nonspecific. 8.  Emphysema (ICD10-J43.9). 9. Stable radiation therapy related findings in the right perihilar regions. Right upper lobectomy. Electronically Signed   By: WVan ClinesM.D.   On: 02/01/2022 17:21   DG Chest 2 View  Result Date: 02/01/2022 CLINICAL DATA:  Shortness of breath EXAM: CHEST - 2 VIEW COMPARISON:  Previous studies including examination of 008/20/2023FINDINGS: Cardiac size is within normal limits. There is decreased volume right lung. Linear densities are seen in right parahilar region and right  lower lung field with no significant change suggesting scarring. Blunting of right lateral CP angle may suggest pleural thickening. There are no new infiltrates or signs of pulmonary edema. There is no pneumothorax. Tip of chest port is seen in the course of the superior vena cava. IMPRESSION: There are no new infiltrates or signs of pulmonary edema. Electronically Signed    By: Elmer Picker M.D.   On: 02/01/2022 12:37   DG Chest 2 View  Result Date: 01/31/2022 CLINICAL DATA:  Persistent cough EXAM: CHEST - 2 VIEW COMPARISON:  02/04/2020 FINDINGS: Postsurgical changes of right upper lobectomy with interstitial thickening and scarring. Unchanged blunting of the right costophrenic angle, likely scarring. Unchanged cardiac and mediastinal contours. Left chest port with catheter tip in the SVC. The left lung is clear. No acute osseous abnormality. IMPRESSION: Stable chest radiograph. Electronically Signed   By: Merilyn Baba M.D.   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 Recent chemotherapy. Prior radiation. EXAM: NUCLEAR MEDICINE PET SKULL BASE TO THIGH TECHNIQUE: 7.1 mCi F-18 FDG was injected intravenously. Full-ring PET imaging was performed from the skull base to thigh after the radiotracer. CT data was obtained and used for attenuation correction and anatomic localization. Fasting blood glucose: 110 mg/dl COMPARISON:  CT chest abdomen pelvis dated 11/15/2021 FINDINGS: Mediastinal blood pool activity: SUV max 1.4 Liver activity: SUV max NA NECK: No hypermetabolic cervical lymphadenopathy. Stable 9 mm lesion deep to the left parotid (series 2/image 31), max SUV 4.1, previously 4.6. Incidental CT findings: none CHEST: Status post right upper lobectomy. Radiation changes in the right hemithorax. 9 mm nodular opacity in the central left upper lobe (series 2/image 32), max SUV 1.7. This is enlarging on CT, raising concern for primary bronchogenic neoplasm or metastasis. 1.9 cm thick-walled cavitary lesion in the lateral right lower lobe (series 2/image 100), max SUV 4.5, but new from January 2023. Adjacent 3.3 cm thin-walled cavitary lesion in the anteromedial right lower lobe (series 2/image 105) is improved from January 2023 and non FDG avid. As  such, infectious/inflammation is favored. No hypermetabolic thoracic lymphadenopathy. Incidental CT findings: Better evaluated on recent dedicated CT chest. Mosaic attenuation in the lungs bilaterally. ABDOMEN/PELVIS: No abnormal hypermetabolism in the liver, spleen, pancreas, or adrenal glands. No hypermetabolic abdominopelvic lymphadenopathy. Multifocal hypermetabolism adjacent to the sigmoid colon, corresponding to the chronic diverticular abscesses noted on recent CT. Representative max SUV 5.6. Incidental CT findings: Better evaluated on recent dedicated CT chest. Vascular calcifications. Thick-walled bladder. Sigmoid diverticulosis. Prominent rectal stool burden. SKELETON: No focal hypermetabolic activity to suggest skeletal metastasis. Incidental CT findings: none IMPRESSION: 9 mm nodular opacity in the central left upper lobe demonstrates mild hypermetabolism and is enlarging on CT, raising concern for primary bronchogenic neoplasm or metastasis. 1.9 cm thick-walled cavitary lesion in the lateral right lower lobe is hypermetabolic but new from January 2023, favoring infection/inflammation. Adjacent 3.3 cm thin-walled cavitary lesion in the right lower lobe is improved and non FDG avid. Attention on follow-up is suggested. Status post right upper lobectomy with radiation changes in the right hemithorax. 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 Electronically Signed   By: Julian Hy M.D.   On: 11/23/2021 00:09

## 2022-02-15 NOTE — Assessment & Plan Note (Addendum)
Patient previously refuses nutrition supplements. Encourage patient to take limitation.   Encourage patient to follow-up with nutritionist

## 2022-02-15 NOTE — Assessment & Plan Note (Signed)
07/16/2020-09/28/2020 carboplatin Taxol and Keytruda x 1  partial response -Keytruda maintenance.-04/22/2021, progression-resumed on carboplatin/Taxol/Keytruda - did not tolerate due to myelosuppression.-->  Taxol held, carboplatin/Keytruda--> CT 07/2020 progression --> 08/03/2021 started docetaxel and ramucirumab.--> Labs are reviewed and discussed with patient. Hold Docetaxel and Ramucirumab.  CT showed Cavitary lesions of the anterior right lower lobe demonstrate increased wall thickening and there is a new irregular solid pulmonary nodule of the left upper lobe.  Etiology unclear, atypical infection versus pneumonitis versus cancer progression. Discussed with Dr. Patsey Berthold who will see patient next week for evaluation.  Most likely needs a biopsy to clarify the etiology.  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

## 2022-02-17 ENCOUNTER — Inpatient Hospital Stay: Payer: 59

## 2022-02-20 ENCOUNTER — Ambulatory Visit: Payer: 59 | Admitting: Pulmonary Disease

## 2022-02-22 ENCOUNTER — Ambulatory Visit
Admission: RE | Admit: 2022-02-22 | Discharge: 2022-02-22 | Disposition: A | Discharge status: Home / Self Care | Payer: 59 | Source: Ambulatory Visit | Attending: Pulmonary Disease | Admitting: Pulmonary Disease

## 2022-02-22 DIAGNOSIS — R911 Solitary pulmonary nodule: Secondary | ICD-10-CM | POA: Diagnosis present

## 2022-03-01 ENCOUNTER — Encounter: Payer: Self-pay | Admitting: Radiation Oncology

## 2022-03-01 ENCOUNTER — Ambulatory Visit (INDEPENDENT_AMBULATORY_CARE_PROVIDER_SITE_OTHER): Payer: 59 | Admitting: Pulmonary Disease

## 2022-03-01 ENCOUNTER — Encounter: Payer: Self-pay | Admitting: Pulmonary Disease

## 2022-03-01 ENCOUNTER — Ambulatory Visit
Admission: RE | Admit: 2022-03-01 | Discharge: 2022-03-01 | Disposition: A | Discharge status: Home / Self Care | Payer: 59 | Source: Ambulatory Visit | Attending: Radiation Oncology | Admitting: Radiation Oncology

## 2022-03-01 VITALS — BP 110/60 | HR 69 | Temp 98.3°F | Ht 73.0 in | Wt 138.6 lb

## 2022-03-01 VITALS — BP 162/82 | HR 108 | Temp 97.0°F | Resp 20 | Wt 135.7 lb

## 2022-03-01 DIAGNOSIS — J449 Chronic obstructive pulmonary disease, unspecified: Secondary | ICD-10-CM

## 2022-03-01 DIAGNOSIS — C3491 Malignant neoplasm of unspecified part of right bronchus or lung: Secondary | ICD-10-CM

## 2022-03-01 DIAGNOSIS — J984 Other disorders of lung: Secondary | ICD-10-CM

## 2022-03-01 DIAGNOSIS — I5032 Chronic diastolic (congestive) heart failure: Secondary | ICD-10-CM

## 2022-03-01 DIAGNOSIS — I272 Pulmonary hypertension, unspecified: Secondary | ICD-10-CM

## 2022-03-01 DIAGNOSIS — C3411 Malignant neoplasm of upper lobe, right bronchus or lung: Secondary | ICD-10-CM | POA: Insufficient documentation

## 2022-03-01 DIAGNOSIS — Z923 Personal history of irradiation: Secondary | ICD-10-CM | POA: Diagnosis not present

## 2022-03-01 DIAGNOSIS — Z87891 Personal history of nicotine dependence: Secondary | ICD-10-CM | POA: Insufficient documentation

## 2022-03-01 DIAGNOSIS — E43 Unspecified severe protein-calorie malnutrition: Secondary | ICD-10-CM

## 2022-03-01 MED ORDER — ALBUTEROL SULFATE (2.5 MG/3ML) 0.083% IN NEBU: 2.5000 mg | INHALATION_SOLUTION | Freq: Four times a day (QID) | RESPIRATORY_TRACT | 5 refills | Status: DC | PRN

## 2022-03-01 NOTE — Progress Notes (Signed)
Subjective:    Patient ID: Samuel Choi, male    DOB: May 23, 1966, 56 y.o.   MRN: [id number] Patient Care Team: Samuel Server, MD as PCP - General (Oncology) Samuel Server, MD as Consulting Physician (Hematology and Oncology)  Chief Complaint  Patient presents with   Follow-up    Breathing is unchanged. No new co's.     HPI Patient is a 56 year old former smoker, with a history as noted below, who presents for follow-up on the issue of abnormal imaging of the lung in the setting of squamous cell carcinoma of the right lung.  That recall the patient had been previously seen by Dr. Patricia Choi in 2020 for diagnosis of his cancer and required thoracotomy and thoracotomy redo for completion of excision of the tumor.  The patient had been on Keytruda and had imaging performed on 15 Nov 2021 showing a thick-walled cavity on the right lower lobe that had thinned out and had some evolution concerning for infectious etiology.  Is also an enlarging mixed cystic and solid nodule in the left upper lobe which was concerning for a metastatic lesion.  The patient then underwent a PET/CT on 15 May with the nodular opacity in the left upper lobe concerning for potential metastasis however a very low hypermetabolism and the lesions on the right lower lobe consistent more with inflammation.  There are also extensive radiation changes in the right hemithorax.  After that imaging I evaluated the patient on 17 May.  At that time the patient was recovering from C. difficile colitis and actually was still having some issues with this.  He was also being treated for potential Keytruda induced pneumonitis.  The patient was not inclined to have any invasive procedures and appeared to be very debilitated.  It was elected to follow this finding expectantly with a repeat CT.  He also received another short course of steroids.  However, after that visit the patient was admitted on 26 July to [facility] with pneumonia and also SVT.  Required  management for his SVT.  He was also noted to have significant volume depletion.  He was discharged on p.o. antibiotics.  Following that admission he was readmitted again with severe protein calorie malnutrition, hypoxia and cavitary lesions in the lung.  Unfortunately sputum collected at that time was limited only to MTB probe and not collected for other organisms.  He had an elevated procalcitonin and leukocytosis.  He was discharged on oxygen which he now has been weaned off of.  The findings on the lung have progressed and appear to be related to inflammatory/infectious process.  The patient is still bringing up sputum that is at times clear and at times yellow.  Has not had any fevers, chills or sweats.  Patient has not had any hemoptysis.  He states he is actually feeling somewhat better.  He does not interact much during the visit today.  I reviewed chest films in detail with the patient and representative images are as noted below.  Review of Systems A 10 point review of systems was performed and it is as noted above otherwise negative.  Past Medical History:  Diagnosis Date   Alcohol abuse    usually drinks 2-3 drinks per day   Atherosclerosis 06/2018   Chronic sinusitis    Dehydration 02/07/2019   Emphysema of lung ([place]) 06/2018   patient unaware of this.   Hip fracture ([place]) 05/2018   no surgery   History of kidney stones 05/2018  per xray, bilateral nephrolitiasis   Hypertension    Squamous cell carcinoma of lung, right ([place]) 06/2018   Past Surgical History:  Procedure Laterality Date   BRAIN SURGERY  10/2017   nasal/sinus endoscopy. mass benign   ELECTROMAGNETIC NAVIGATION BROCHOSCOPY Right 06/25/2018   Procedure: ELECTROMAGNETIC NAVIGATION BRONCHOSCOPY;  Surgeon: Samuel Lipps, MD;  Location: [facility] ORS;  Service: Cardiopulmonary;  Laterality: Right;   ENDOBRONCHIAL ULTRASOUND Right 06/25/2018   Procedure: ENDOBRONCHIAL ULTRASOUND;  Surgeon: Samuel Lipps, MD;  Location: [facility]  ORS;  Service: Cardiopulmonary;  Laterality: Right;   ENDOBRONCHIAL ULTRASOUND Right 12/26/2018   Procedure: ENDOBRONCHIAL ULTRASOUND RIGHT;  Surgeon: Samuel Lipps, MD;  Location: [facility] ORS;  Service: Cardiopulmonary;  Laterality: Right;   FLEXIBLE BRONCHOSCOPY N/A 08/20/2018   Procedure: FLEXIBLE BRONCHOSCOPY PREOP;  Surgeon: Samuel Lewandowsky, MD;  Location: [facility] ORS;  Service: Thoracic;  Laterality: N/A;   IR CV LINE INJECTION  04/04/2019   NASAL SINUS SURGERY  10/2017   At [facility], frontal sinusotomy, ethmoidectomy, resection anterior cranial fossa neoplasm, turbinate resection   PERIPHERAL VASCULAR THROMBECTOMY Left 12/31/2020   Procedure: PERIPHERAL VASCULAR THROMBECTOMY;  Surgeon: Samuel Huxley, MD;  Location: [facility] CV LAB;  Service: Cardiovascular;  Laterality: Left;   PORTACATH PLACEMENT Left 01/15/2019   Procedure: INSERTION PORT-A-CATH;  Surgeon: Samuel Lewandowsky, MD;  Location: [facility] ORS;  Service: General;  Laterality: Left;   THORACOTOMY Right 08/13/2018   Procedure: PREOP BROCHOSCOPY WITH RIGHT THORACOTOMY AND RUL RESECTION;  Surgeon: Samuel Lewandowsky, MD;  Location: [facility] ORS;  Service: General;  Laterality: Right;   THORACOTOMY Right 08/20/2018   Procedure: THORACOTOMY MAJOR RIGHT UPPER LOBE LOBECTOMY;  Surgeon: Samuel Lewandowsky, MD;  Location: [facility] ORS;  Service: Thoracic;  Laterality: Right;   TOE SURGERY Left    pin in left toe   Patient Active Problem List   Diagnosis Date Noted   Cavitary lesion of lung 02/11/2022   Acute respiratory failure ([place]) 02/10/2022   Lactic acidosis 02/10/2022   SVT (supraventricular tachycardia) ([place]) 02/02/2022   Sepsis ([place]) 02/01/2022   Hyponatremia 02/01/2022   Chronic diastolic CHF (congestive heart failure) ([place]) 02/01/2022   Macrocytic anemia 02/01/2022   Elevated serum creatinine 01/25/2022   Diverticulosis of colon 11/18/2021   History of thrombosis 11/18/2021   Weight loss 11/09/2021   C. difficile colitis 10/19/2021   Hyperkalemia 07/28/2021    Severe protein-calorie malnutrition ([place]) 06/08/2021   Antineoplastic chemotherapy induced anemia 06/08/2021   Pulmonary embolus ([place]) 06/08/2021   Chemotherapy induced nausea and vomiting 05/17/2021   Symptomatic anemia 05/17/2021   Wide-complex tachycardia    Chemotherapy induced neutropenia ([place]) 11/09/2020   COPD (chronic obstructive pulmonary disease) ([place]) 04/22/2020   Sinus tachycardia 12/18/2019   Hypomagnesemia 12/10/2019   Encounter for antineoplastic immunotherapy 11/24/2019   Ex-smoker 07/23/2019   Port-A-Cath in place 04/04/2019   Thrombocytopenia ([place]) 04/04/2019   Folate deficiency 04/04/2019   Hypokalemia 04/04/2019   B12 deficiency 04/04/2019   Alcohol abuse 04/04/2019   Dehydration 02/07/2019   Encounter for antineoplastic chemotherapy 01/31/2019   Squamous cell carcinoma of right lung ([place]) 09/03/2018   Goals of care, counseling/discussion 09/03/2018   Malnutrition of moderate degree 08/23/2018   Emphysema of lung ([place]) 06/2018   Allergic rhinitis 06/2018   H/O nasal polyp 02/20/2018   Acute sinusitis 10/10/2017   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/10/2017   Chronic rhinitis 10/10/2017   Mass of sinus 10/10/2017   Essential hypertension 09/27/2017   Tobacco abuse 09/27/2017   Family History  Problem Relation Age of Onset  Breast cancer Mother    Diabetes Mother    Lung cancer Father    Hypertension Father    Social History   Tobacco Use   Smoking status: Former    Packs/day: 0.50    Years: 15.00    Total pack years: 7.50    Types: Cigarettes    Quit date: 08/2018    Years since quitting: 3.5   Smokeless tobacco: Never  Substance Use Topics   Alcohol use: Yes    Alcohol/week: 3.0 standard drinks of alcohol    Types: 3 Cans of beer per week    Comment: usually 2 drinks per week, per patient   No Known Allergies  Current Meds  Medication Sig   albuterol (VENTOLIN HFA) 108 (90 Base) MCG/ACT inhaler INHALE 2 PUFFS BY MOUTH EVERY 6 HOURS AS NEEDED FOR  WHEEZE OR SHORTNESS OF BREATH   apixaban (ELIQUIS) 5 MG TABS tablet Take 1 tablet (5 mg total) by mouth 2 (two) times daily.   benzonatate (TESSALON) 100 MG capsule Take 1 capsule (100 mg total) by mouth 3 (three) times daily as needed for cough.   budesonide-formoterol (SYMBICORT) 80-4.5 MCG/ACT inhaler Inhale 2 puffs into the lungs 2 (two) times daily. in the morning and at bedtime.   diltiazem (CARDIZEM CD) 120 MG 24 hr capsule Take 120 mg by mouth daily.   feeding supplement (ENSURE ENLIVE / ENSURE PLUS) LIQD Take 237 mLs by mouth 3 (three) times daily between meals.   HYDROcodone-acetaminophen (NORCO/VICODIN) 5-325 MG tablet Take 1 tablet by mouth every 8 (eight) hours as needed for severe pain.   latanoprost (XALATAN) 0.005 % ophthalmic solution Place 1 drop into both eyes at bedtime.   loratadine (CLARITIN) 10 MG tablet Take 10 mg by mouth daily.   methocarbamol (ROBAXIN) 500 MG tablet Take 1 tablet (500 mg total) by mouth at bedtime.   metoprolol succinate (TOPROL-XL) 25 MG 24 hr tablet Take 1 tablet (25 mg total) by mouth daily.   Multiple Vitamin (MULTIVITAMIN WITH MINERALS) TABS tablet Take 1 tablet by mouth daily.   ondansetron (ZOFRAN) 8 MG tablet Take 1 tablet (8 mg total) by mouth every 8 (eight) hours as needed for refractory nausea / vomiting. Start on day 3 after chemo.   pantoprazole (PROTONIX) 20 MG tablet TAKE 1 TABLET(20 MG) BY MOUTH DAILY   potassium chloride (KLOR-CON) 10 MEQ tablet Take 10 mEq by mouth daily. Start taking 01/14/22   SPIRIVA HANDIHALER 18 MCG inhalation capsule INHALE 1 CAPSULE VIA HANDIHALER ONCE DAILY AT THE SAME TIME EVERY DAY   vitamin B-12 (CYANOCOBALAMIN) 1000 MCG tablet Take 1 tablet (1,000 mcg total) by mouth daily.   Immunization History  Administered Date(s) Administered   Influenza Inj Mdck Quad Pf 03/28/2019   Influenza, High Dose Seasonal PF 03/28/2019   Moderna Sars-Covid-2 Vaccination 11/14/2019   Pneumococcal Conjugate-13 02/17/2021    Pneumococcal Polysaccharide-23 11/19/2018   Tdap 12/31/2017   Zoster Recombinat (Shingrix) 03/28/2019, 05/01/2019, 06/09/2019      Objective:   Physical Exam BP 110/60 (BP Location: Left Arm, Cuff Size: Normal)   Pulse 69   Temp 98.3 F (36.8 C) (Oral)   Ht _0  (1.854 m)   Wt 138 lb 9.6 oz (62.9 kg)   SpO2 90%   BMI 18.29 kg/m  GENERAL: Thin, frail-appearing gentleman, presents in transport chair.  No acute distress. HEAD: Normocephalic, atraumatic.  Alopecia due to chemotherapy. EYES: Pupils equal, round, reactive to light.  No scleral icterus.  Occasional disconjugate gaze.  MOUTH: Poor dentition, oral mucosa moist.  No thrush. NECK: Supple. No thyromegaly. Trachea midline. No JVD.  No adenopathy. PULMONARY: Good air entry bilaterally.  Localized wheezing and rhonchi on right mid lung field. CARDIOVASCULAR: S1 and S2. Regular rate and rhythm.  No rubs, murmurs or gallops heard. ABDOMEN: Scaphoid, otherwise benign. MUSCULOSKELETAL: No joint deformity, no clubbing, no edema.  Significant sarcopenia. NEUROLOGIC: Very debilitated cannot assess gait. SKIN: Intact,warm,dry. PSYCH: Flat affect.  ECOG 3   Recent Results (from the past 2160 hour(s))  Comprehensive metabolic panel     Status: Abnormal   Collection Time: 12/02/21  1:45 PM  Result Value Ref Range   Sodium 137 135 - 145 mmol/L   Potassium 4.0 3.5 - 5.1 mmol/L   Chloride 103 98 - 111 mmol/L   CO2 22 22 - 32 mmol/L   Glucose, Bld 84 70 - 99 mg/dL    Comment: Glucose reference range applies only to samples taken after fasting for at least 8 hours.   BUN 10 6 - 20 mg/dL   Creatinine, Ser 0.95 0.61 - 1.24 mg/dL   Calcium 8.5 (L) 8.9 - 10.3 mg/dL   Total Protein 7.3 6.5 - 8.1 g/dL   Albumin 3.2 (L) 3.5 - 5.0 g/dL   AST 16 15 - 41 U/L   ALT 7 0 - 44 U/L   Alkaline Phosphatase 115 38 - 126 U/L   Total Bilirubin 0.5 0.3 - 1.2 mg/dL   GFR, Estimated >60 >60 mL/min    Comment: (NOTE) Calculated using the CKD-EPI  Creatinine Equation (2021)    Anion gap 12 5 - 15    Comment: Performed at [facility], [address] [place], [place] [address]  CBC with Differential     Status: Abnormal   Collection Time: 12/02/21  1:45 PM  Result Value Ref Range   WBC 12.0 (H) 4.0 - 10.5 K/uL   RBC 3.33 (L) 4.22 - 5.81 MIL/uL   Hemoglobin 11.5 (L) 13.0 - 17.0 g/dL   HCT 34.8 (L) 39.0 - 52.0 %   MCV 104.5 (H) 80.0 - 100.0 fL   MCH 34.5 (H) 26.0 - 34.0 pg   MCHC 33.0 30.0 - 36.0 g/dL   RDW 16.3 (H) 11.5 - 15.5 %   Platelets 274 150 - 400 K/uL   nRBC 0.3 (H) 0.0 - 0.2 %   Neutrophils Relative % 68 %   Neutro Abs 8.2 (H) 1.7 - 7.7 K/uL   Lymphocytes Relative 20 %   Lymphs Abs 2.4 0.7 - 4.0 K/uL   Monocytes Relative 6 %   Monocytes Absolute 0.7 0.1 - 1.0 K/uL   Eosinophils Relative 4 %   Eosinophils Absolute 0.5 0.0 - 0.5 K/uL   Basophils Relative 1 %   Basophils Absolute 0.1 0.0 - 0.1 K/uL   Immature Granulocytes 1 %   Abs Immature Granulocytes 0.07 0.00 - 0.07 K/uL    Comment: Performed at [facility], [address] [place], [place] [address]  Comprehensive metabolic panel     Status: Abnormal   Collection Time: 12/16/21 10:00 AM  Result Value Ref Range   Sodium 139 135 - 145 mmol/L   Potassium 4.2 3.5 - 5.1 mmol/L   Chloride 102 98 - 111 mmol/L   CO2 24 22 - 32 mmol/L   Glucose, Bld 116 (H) 70 - 99 mg/dL    Comment: Glucose reference range applies only to samples taken after fasting for at least 8 hours.   BUN  10 6 - 20 mg/dL   Creatinine, Ser 0.94 0.61 - 1.24 mg/dL   Calcium 9.1 8.9 - 10.3 mg/dL   Total Protein 7.2 6.5 - 8.1 g/dL   Albumin 3.2 (L) 3.5 - 5.0 g/dL   AST 26 15 - 41 U/L   ALT 8 0 - 44 U/L   Alkaline Phosphatase 111 38 - 126 U/L   Total Bilirubin 0.9 0.3 - 1.2 mg/dL   GFR, Estimated >60 >60 mL/min    Comment: (NOTE) Calculated using the CKD-EPI Creatinine Equation (2021)    Anion gap 13 5 - 15    Comment: Performed at [facility], [address]  [place], [place] [address]  CBC with Differential     Status: Abnormal   Collection Time: 12/16/21 10:00 AM  Result Value Ref Range   WBC 16.1 (H) 4.0 - 10.5 K/uL   RBC 3.67 (L) 4.22 - 5.81 MIL/uL   Hemoglobin 13.2 13.0 - 17.0 g/dL   HCT 39.2 39.0 - 52.0 %   MCV 106.8 (H) 80.0 - 100.0 fL   MCH 36.0 (H) 26.0 - 34.0 pg   MCHC 33.7 30.0 - 36.0 g/dL   RDW 15.4 11.5 - 15.5 %   Platelets 165 150 - 400 K/uL   nRBC 0.0 0.0 - 0.2 %   Neutrophils Relative % 82 %   Neutro Abs 13.5 (H) 1.7 - 7.7 K/uL   Lymphocytes Relative 8 %   Lymphs Abs 1.2 0.7 - 4.0 K/uL   Monocytes Relative 7 %   Monocytes Absolute 1.0 0.1 - 1.0 K/uL   Eosinophils Relative 1 %   Eosinophils Absolute 0.2 0.0 - 0.5 K/uL   Basophils Relative 1 %   Basophils Absolute 0.1 0.0 - 0.1 K/uL   Immature Granulocytes 1 %   Abs Immature Granulocytes 0.09 (H) 0.00 - 0.07 K/uL    Comment: Performed at [facility], [address] [place], [place] [address]  Comprehensive metabolic panel     Status: Abnormal   Collection Time: 12/30/21  9:10 AM  Result Value Ref Range   Sodium 134 (L) 135 - 145 mmol/L   Potassium 4.2 3.5 - 5.1 mmol/L   Chloride 98 98 - 111 mmol/L   CO2 23 22 - 32 mmol/L   Glucose, Bld 172 (H) 70 - 99 mg/dL    Comment: Glucose reference range applies only to samples taken after fasting for at least 8 hours.   BUN 20 6 - 20 mg/dL   Creatinine, Ser 1.09 0.61 - 1.24 mg/dL   Calcium 8.6 (L) 8.9 - 10.3 mg/dL   Total Protein 7.2 6.5 - 8.1 g/dL   Albumin 3.5 3.5 - 5.0 g/dL   AST 29 15 - 41 U/L   ALT 17 0 - 44 U/L   Alkaline Phosphatase 94 38 - 126 U/L   Total Bilirubin 1.5 (H) 0.3 - 1.2 mg/dL   GFR, Estimated >60 >60 mL/min    Comment: (NOTE) Calculated using the CKD-EPI Creatinine Equation (2021)    Anion gap 13 5 - 15    Comment: Performed at [facility], [address] [place], [place] [address]  CBC with Differential     Status: Abnormal   Collection Time: 12/30/21  9:10 AM  Result Value Ref  Range   WBC 19.6 (H) 4.0 - 10.5 K/uL   RBC 3.38 (L) 4.22 - 5.81 MIL/uL   Hemoglobin 12.4 (L) 13.0 - 17.0 g/dL   HCT 35.4 (L) 39.0 -  52.0 %   MCV 104.7 (H) 80.0 - 100.0 fL   MCH 36.7 (H) 26.0 - 34.0 pg   MCHC 35.0 30.0 - 36.0 g/dL   RDW 16.0 (H) 11.5 - 15.5 %   Platelets 147 (L) 150 - 400 K/uL   nRBC 0.0 0.0 - 0.2 %   Neutrophils Relative % 96 %   Neutro Abs 18.8 (H) 1.7 - 7.7 K/uL   Lymphocytes Relative 2 %   Lymphs Abs 0.4 (L) 0.7 - 4.0 K/uL   Monocytes Relative 1 %   Monocytes Absolute 0.3 0.1 - 1.0 K/uL   Eosinophils Relative 0 %   Eosinophils Absolute 0.0 0.0 - 0.5 K/uL   Basophils Relative 0 %   Basophils Absolute 0.0 0.0 - 0.1 K/uL   Immature Granulocytes 1 %   Abs Immature Granulocytes 0.12 (H) 0.00 - 0.07 K/uL    Comment: Performed at [facility], [address] [place], [place] [address]  T3, free     Status: None   Collection Time: 12/30/21  9:10 AM  Result Value Ref Range   T3, Free 2.1 2.0 - 4.4 pg/mL    Comment: (NOTE) Performed At: [facility] [place], [place] [id number] Rush Farmer MD [id number]   TSH     Status: None   Collection Time: 12/30/21  9:10 AM  Result Value Ref Range   TSH 0.685 0.350 - 4.500 uIU/mL    Comment: Performed by a 3rd Generation assay with a functional sensitivity of <=0.01 uIU/mL. Performed at [facility], [address] [place], [place] [address]   Protein, urine, random     Status: None   Collection Time: 12/30/21  9:35 AM  Result Value Ref Range   Total Protein, Urine 79 mg/dL    Comment: NO NORMAL RANGE ESTABLISHED FOR THIS TEST Performed at [facility], [address] [place], [place] [address]   Comprehensive metabolic panel     Status: Abnormal   Collection Time: 01/05/22 10:15 AM  Result Value Ref Range   Sodium 131 (L) 135 - 145 mmol/L   Potassium 3.5 3.5 - 5.1 mmol/L   Chloride 96 (L) 98 - 111 mmol/L   CO2 23 22 - 32 mmol/L   Glucose, Bld 116 (H) 70 -  99 mg/dL    Comment: Glucose reference range applies only to samples taken after fasting for at least 8 hours.   BUN 19 6 - 20 mg/dL   Creatinine, Ser 0.97 0.61 - 1.24 mg/dL   Calcium 8.4 (L) 8.9 - 10.3 mg/dL   Total Protein 6.4 (L) 6.5 - 8.1 g/dL   Albumin 3.3 (L) 3.5 - 5.0 g/dL   AST 27 15 - 41 U/L   ALT 16 0 - 44 U/L   Alkaline Phosphatase 66 38 - 126 U/L   Total Bilirubin 0.5 0.3 - 1.2 mg/dL   GFR, Estimated >60 >60 mL/min    Comment: (NOTE) Calculated using the CKD-EPI Creatinine Equation (2021)    Anion gap 12 5 - 15    Comment: Performed at [facility], [address] [place], [place] [address]  CBC with Differential     Status: Abnormal   Collection Time: 01/05/22 10:15 AM  Result Value Ref Range   WBC 1.0 (LL) 4.0 - 10.5 K/uL    Comment: REPEATED TO VERIFY THIS CRITICAL RESULT HAS VERIFIED AND BEEN CALLED TO DR Tasia Catchings BY KIM ROOS ON 06 29 2023 AT 1042, AND HAS BEEN  READ BACK.     RBC 3.28 (L) 4.22 - 5.81 MIL/uL   Hemoglobin 11.9 (L) 13.0 - 17.0 g/dL   HCT 33.8 (L) 39.0 - 52.0 %   MCV 103.0 (H) 80.0 - 100.0 fL   MCH 36.3 (H) 26.0 - 34.0 pg   MCHC 35.2 30.0 - 36.0 g/dL   RDW 14.6 11.5 - 15.5 %   Platelets 94 (L) 150 - 400 K/uL    Comment: SPECIMEN CHECKED FOR CLOTS   nRBC 0.0 0.0 - 0.2 %   Neutrophils Relative % 67 %   Neutro Abs 0.7 (L) 1.7 - 7.7 K/uL   Band Neutrophils 1 %   Lymphocytes Relative 21 %   Lymphs Abs 0.2 (L) 0.7 - 4.0 K/uL   Monocytes Relative 4 %   Monocytes Absolute 0.0 (L) 0.1 - 1.0 K/uL   Eosinophils Relative 5 %   Eosinophils Absolute 0.1 0.0 - 0.5 K/uL   Basophils Relative 0 %   Basophils Absolute 0.0 0.0 - 0.1 K/uL   WBC Morphology MANUAL DIFF.  MORPHOLOGY UNREMARKABLE.    RBC Morphology UNREMARKABLE    Smear Review Normal platelet morphology     Comment: PLATELETS APPEAR DECREASED   Metamyelocytes Relative 2 %   Abs Immature Granulocytes 0.00 0.00 - 0.07 K/uL    Comment: Performed at [facility], [address]  [place], [place] [address]  Magnesium     Status: Abnormal   Collection Time: 01/13/22  9:43 AM  Result Value Ref Range   Magnesium 1.6 (L) 1.7 - 2.4 mg/dL    Comment: Performed at [facility], [address] [place], [place] [address]  Comprehensive metabolic panel     Status: Abnormal   Collection Time: 01/13/22  9:43 AM  Result Value Ref Range   Sodium 134 (L) 135 - 145 mmol/L   Potassium 2.8 (L) 3.5 - 5.1 mmol/L   Chloride 98 98 - 111 mmol/L   CO2 24 22 - 32 mmol/L   Glucose, Bld 132 (H) 70 - 99 mg/dL    Comment: Glucose reference range applies only to samples taken after fasting for at least 8 hours.   BUN 14 6 - 20 mg/dL   Creatinine, Ser 0.77 0.61 - 1.24 mg/dL   Calcium 8.5 (L) 8.9 - 10.3 mg/dL   Total Protein 6.8 6.5 - 8.1 g/dL   Albumin 3.2 (L) 3.5 - 5.0 g/dL   AST 21 15 - 41 U/L   ALT 12 0 - 44 U/L   Alkaline Phosphatase 127 (H) 38 - 126 U/L   Total Bilirubin 1.0 0.3 - 1.2 mg/dL   GFR, Estimated >60 >60 mL/min    Comment: (NOTE) Calculated using the CKD-EPI Creatinine Equation (2021)    Anion gap 12 5 - 15    Comment: Performed at [facility], [address] [place],  [address]  CBC with Differential     Status: Abnormal   Collection Time: 01/13/22  9:43 AM  Result Value Ref Range   WBC 26.6 (H) 4.0 - 10.5 K/uL   RBC 3.55 (L) 4.22 - 5.81 MIL/uL   Hemoglobin 12.7 (L) 13.0 - 17.0 g/dL   HCT 36.6 (L) 39.0 - 52.0 %   MCV 103.1 (H) 80.0 - 100.0 fL   MCH 35.8 (H) 26.0 - 34.0 pg   MCHC 34.7 30.0 - 36.0 g/dL   RDW 14.6 11.5 - 15.5 %   Platelets 123 (L) 150 - 400 K/uL   nRBC 0.4 (  H) 0.0 - 0.2 %   Neutrophils Relative % 88 %   Neutro Abs 23.2 (H) 1.7 - 7.7 K/uL   Lymphocytes Relative 6 %   Lymphs Abs 1.6 0.7 - 4.0 K/uL   Monocytes Relative 5 %   Monocytes Absolute 1.4 (H) 0.1 - 1.0 K/uL   Eosinophils Relative 0 %   Eosinophils Absolute 0.0 0.0 - 0.5 K/uL   Basophils Relative 0 %   Basophils Absolute 0.1 0.0 - 0.1 K/uL   Immature Granulocytes 1 %    Abs Immature Granulocytes 0.19 (H) 0.00 - 0.07 K/uL    Comment: Performed at [facility], [address] [place], [place] [address]  Comprehensive metabolic panel     Status: Abnormal   Collection Time: 01/25/22 10:04 AM  Result Value Ref Range   Sodium 136 135 - 145 mmol/L   Potassium 4.6 3.5 - 5.1 mmol/L   Chloride 104 98 - 111 mmol/L   CO2 21 (L) 22 - 32 mmol/L   Glucose, Bld 137 (H) 70 - 99 mg/dL    Comment: Glucose reference range applies only to samples taken after fasting for at least 8 hours.   BUN 16 6 - 20 mg/dL   Creatinine, Ser 1.28 (H) 0.61 - 1.24 mg/dL   Calcium 8.7 (L) 8.9 - 10.3 mg/dL   Total Protein 6.8 6.5 - 8.1 g/dL   Albumin 3.2 (L) 3.5 - 5.0 g/dL   AST 33 15 - 41 U/L   ALT 11 0 - 44 U/L   Alkaline Phosphatase 119 38 - 126 U/L   Total Bilirubin 0.5 0.3 - 1.2 mg/dL   GFR, Estimated >60 >60 mL/min    Comment: (NOTE) Calculated using the CKD-EPI Creatinine Equation (2021)    Anion gap 11 5 - 15    Comment: Performed at [facility], [address] [place],  [address]  CBC with Differential     Status: Abnormal   Collection Time: 01/25/22 10:04 AM  Result Value Ref Range   WBC 18.0 (H) 4.0 - 10.5 K/uL   RBC 3.25 (L) 4.22 - 5.81 MIL/uL   Hemoglobin 11.7 (L) 13.0 - 17.0 g/dL   HCT 34.9 (L) 39.0 - 52.0 %   MCV 107.4 (H) 80.0 - 100.0 fL   MCH 36.0 (H) 26.0 - 34.0 pg   MCHC 33.5 30.0 - 36.0 g/dL   RDW 15.9 (H) 11.5 - 15.5 %   Platelets 142 (L) 150 - 400 K/uL   nRBC 0.2 0.0 - 0.2 %   Neutrophils Relative % 92 %   Neutro Abs 16.5 (H) 1.7 - 7.7 K/uL   Lymphocytes Relative 4 %   Lymphs Abs 0.8 0.7 - 4.0 K/uL   Monocytes Relative 3 %   Monocytes Absolute 0.5 0.1 - 1.0 K/uL   Eosinophils Relative 0 %   Eosinophils Absolute 0.0 0.0 - 0.5 K/uL   Basophils Relative 0 %   Basophils Absolute 0.0 0.0 - 0.1 K/uL   Immature Granulocytes 1 %   Abs Immature Granulocytes 0.11 (H) 0.00 - 0.07 K/uL    Comment: Performed at [facility], [address] [place],  [address]  Hemoglobin A1c     Status: None   Collection Time: 01/25/22 10:54 AM  Result Value Ref Range   Hgb A1c MFr Bld 4.8 4.8 - 5.6 %    Comment: (NOTE) Pre diabetes:          5.7%-6.4%  Diabetes:              >  6.4%  Glycemic control for   <7.0% adults with diabetes    Mean Plasma Glucose 91.06 mg/dL    Comment: Performed at [facility] [address], [place], [place] [address]  Lipid panel     Status: None   Collection Time: 01/25/22 10:54 AM  Result Value Ref Range   Cholesterol 122 0 - 200 mg/dL   Triglycerides 61 <150 mg/dL   HDL 85 >40 mg/dL   Total CHOL/HDL Ratio 1.4 RATIO   VLDL 12 0 - 40 mg/dL   LDL Cholesterol 25 0 - 99 mg/dL    Comment:        Total Cholesterol/HDL:CHD Risk Coronary Heart Disease Risk Table                     Men   Women  1/2 Average Risk   3.4   3.3  Average Risk       5.0   4.4  2 X Average Risk   9.6   7.1  3 X Average Risk  23.4   11.0        Use the calculated Patient Ratio above and the CHD Risk Table to determine the patient's CHD Risk.        ATP III CLASSIFICATION (LDL):  <100     mg/dL   Optimal  100-129  mg/dL   Near or Above                    Optimal  130-159  mg/dL   Borderline  160-189  mg/dL   High  >190     mg/dL   Very High Performed at [facility], [address] [place], [place] [address]   Protein, urine, random     Status: None   Collection Time: 01/25/22 11:00 AM  Result Value Ref Range   Total Protein, Urine 44 mg/dL    Comment: NO NORMAL RANGE ESTABLISHED FOR THIS TEST Performed at [facility], [address] [place], [place] [address]   Comprehensive metabolic panel     Status: Abnormal   Collection Time: 01/31/22  1:23 PM  Result Value Ref Range   Sodium 130 (L) 135 - 145 mmol/L   Potassium 4.0 3.5 - 5.1 mmol/L   Chloride 94 (L) 98 - 111 mmol/L   CO2 25 22 - 32 mmol/L   Glucose, Bld 79 70 - 99 mg/dL    Comment: Glucose reference range  applies only to samples taken after fasting for at least 8 hours.   BUN 20 6 - 20 mg/dL   Creatinine, Ser 1.05 0.61 - 1.24 mg/dL   Calcium 8.2 (L) 8.9 - 10.3 mg/dL   Total Protein 6.4 (L) 6.5 - 8.1 g/dL   Albumin 2.8 (L) 3.5 - 5.0 g/dL   AST 15 15 - 41 U/L   ALT 10 0 - 44 U/L   Alkaline Phosphatase 76 38 - 126 U/L   Total Bilirubin 1.2 0.3 - 1.2 mg/dL   GFR, Estimated >60 >60 mL/min    Comment: (NOTE) Calculated using the CKD-EPI Creatinine Equation (2021)    Anion gap 11 5 - 15    Comment: Performed at [facility], [address] [place],  [address]  CBC with Differential     Status: Abnormal   Collection Time: 01/31/22  1:23 PM  Result Value Ref Range   WBC 1.5 (L) 4.0 - 10.5 K/uL   RBC 3.26 (L) 4.22 -  5.81 MIL/uL   Hemoglobin 11.8 (L) 13.0 - 17.0 g/dL   HCT 33.2 (L) 39.0 - 52.0 %   MCV 101.8 (H) 80.0 - 100.0 fL   MCH 36.2 (H) 26.0 - 34.0 pg   MCHC 35.5 30.0 - 36.0 g/dL   RDW 13.9 11.5 - 15.5 %   Platelets 78 (L) 150 - 400 K/uL   nRBC 0.0 0.0 - 0.2 %   Neutrophils Relative % 39 %   Neutro Abs 0.6 (L) 1.7 - 7.7 K/uL   Lymphocytes Relative 22 %   Lymphs Abs 0.3 (L) 0.7 - 4.0 K/uL   Monocytes Relative 37 %   Monocytes Absolute 0.5 0.1 - 1.0 K/uL   Eosinophils Relative 1 %   Eosinophils Absolute 0.0 0.0 - 0.5 K/uL   Basophils Relative 1 %   Basophils Absolute 0.0 0.0 - 0.1 K/uL   WBC Morphology CONFIRMED BY MANUAL DIFF    RBC Morphology MORPHOLOGY UNREMARKABLE    Smear Review Normal platelet morphology     Comment: PLATELETS APPEAR DECREASED SPECIMEN CHECKED FOR CLOTS    Immature Granulocytes 0 %   Abs Immature Granulocytes 0.00 0.00 - 0.07 K/uL    Comment: Performed at [facility], [address] [place], [place] [address]  Osmolality, urine     Status: None   Collection Time: 02/01/22  1:22 AM  Result Value Ref Range   Osmolality, Ur 647 300 - 900 mOsm/kg    Comment: REPEATED TO VERIFY Performed at [facility], [address] [place], [place] [address]   Legionella Pneumophila Serogp 1 Ur Ag     Status: None   Collection Time: 02/01/22  1:22 AM  Result Value Ref Range   L. pneumophila Serogp 1 Ur Ag Negative Negative    Comment: (NOTE) Presumptive negative for L. pneumophila serogroup 1 antigen in urine, suggesting no recent or current infection. Legionnaires' disease cannot be ruled out since other serogroups and species may also cause disease. Performed At: [facility] [place], [place] [id number] Rush Farmer MD [id number]    Source of Sample URINE, RANDOM     Comment: Performed at [facility], [address] [place], [place] [address]  Basic metabolic panel     Status: Abnormal   Collection Time: 02/01/22 10:40 AM  Result Value Ref Range   Sodium 129 (L) 135 - 145 mmol/L   Potassium 3.4 (L) 3.5 - 5.1 mmol/L   Chloride 97 (L) 98 - 111 mmol/L   CO2 21 (L) 22 - 32 mmol/L   Glucose, Bld 62 (L) 70 - 99 mg/dL    Comment: Glucose reference range applies only to samples taken after fasting for at least 8 hours.   BUN 21 (H) 6 - 20 mg/dL   Creatinine, Ser 1.06 0.61 - 1.24 mg/dL   Calcium 7.7 (L) 8.9 - 10.3 mg/dL   GFR, Estimated >60 >60 mL/min    Comment: (NOTE) Calculated using the CKD-EPI Creatinine Equation (2021)    Anion gap 11 5 - 15    Comment: Performed at [facility] [address] [place], [place] [address]  CBC with Differential     Status: Abnormal   Collection Time: 02/01/22 10:40 AM  Result Value Ref Range   WBC 7.6 4.0 - 10.5 K/uL   RBC 3.01 (L) 4.22 - 5.81 MIL/uL   Hemoglobin 10.7 (L) 13.0 - 17.0 g/dL   HCT 30.3 (L) 39.0 - 52.0 %   MCV 100.7 (  H) 80.0 - 100.0 fL   MCH 35.5 (H) 26.0 - 34.0 pg   MCHC 35.3 30.0 - 36.0 g/dL   RDW 13.7 11.5 - 15.5 %   Platelets 56 (L) 150 - 400 K/uL    Comment: SPECIMEN CHECKED FOR CLOTS Immature Platelet Fraction may be clinically indicated, consider ordering this additional test [id number]    nRBC  0.0 0.0 - 0.2 %   Neutrophils Relative % 78 %   Neutro Abs 6.0 1.7 - 7.7 K/uL   Lymphocytes Relative 4 %   Lymphs Abs 0.3 (L) 0.7 - 4.0 K/uL   Monocytes Relative 17 %   Monocytes Absolute 1.3 (H) 0.1 - 1.0 K/uL   Eosinophils Relative 0 %   Eosinophils Absolute 0.0 0.0 - 0.5 K/uL   Basophils Relative 0 %   Basophils Absolute 0.0 0.0 - 0.1 K/uL   WBC Morphology INCREASED BANDS (>20% BANDS)     Comment: DYSPLASTIC NEUTROPHILS PRESENT   RBC Morphology MORPHOLOGY UNREMARKABLE    Smear Review Normal platelet morphology     Comment: PLATELETS APPEAR DECREASED Reviewed    Immature Granulocytes 1 %   Abs Immature Granulocytes 0.04 0.00 - 0.07 K/uL    Comment: Performed at [facility], [address] [place], [place] [address]  Magnesium     Status: Abnormal   Collection Time: 02/01/22 10:40 AM  Result Value Ref Range   Magnesium 1.1 (L) 1.7 - 2.4 mg/dL    Comment: Performed at [facility], [address] [place], [place] [address]  Brain natriuretic peptide     Status: Abnormal   Collection Time: 02/01/22 10:40 AM  Result Value Ref Range   B Natriuretic Peptide 920.5 (H) 0.0 - 100.0 pg/mL    Comment: Performed at [facility], [address] [place], [place] [address]  Blood culture (routine x 2)     Status: None   Collection Time: 02/01/22  5:39 PM   Specimen: BLOOD RIGHT FOREARM  Result Value Ref Range   Specimen Description BLOOD RIGHT FOREARM    Special Requests      BOTTLES DRAWN AEROBIC AND ANAEROBIC Blood Culture results may not be optimal due to an inadequate volume of blood received in culture bottles   Culture      NO GROWTH 5 DAYS Performed at [facility], [address] [place], [place] [address]    Report Status 02/06/2022 FINAL   Blood culture (routine x 2)     Status: None   Collection Time: 02/01/22  5:44 PM   Specimen: BLOOD RIGHT HAND  Result Value Ref Range   Specimen Description BLOOD RIGHT HAND    Special Requests       BOTTLES DRAWN AEROBIC AND ANAEROBIC Blood Culture adequate volume   Culture      NO GROWTH 5 DAYS Performed at [facility], [address] [place], [place] [address]    Report Status 02/06/2022 FINAL   Procalcitonin     Status: None   Collection Time: 02/01/22  6:15 PM  Result Value Ref Range   Procalcitonin 45.67 ng/mL    Comment:        Interpretation: PCT >= 10 ng/mL: Important systemic inflammatory response, almost exclusively due to severe bacterial sepsis or septic shock. (NOTE)       Sepsis PCT Algorithm           Lower Respiratory Tract  Infection PCT Algorithm    ----------------------------     ----------------------------         PCT < 0.25 ng/mL                PCT < 0.10 ng/mL          Strongly encourage             Strongly discourage   discontinuation of antibiotics    initiation of antibiotics    ----------------------------     -----------------------------       PCT 0.25 - 0.50 ng/mL            PCT 0.10 - 0.25 ng/mL               OR       >80% decrease in PCT            Discourage initiation of                                            antibiotics      Encourage discontinuation           of antibiotics    ----------------------------     -----------------------------         PCT >= 0.50 ng/mL              PCT 0.26 - 0.50 ng/mL                AND       <80% decrease in PCT             Encourage initiation of                                             antibiotics       Encourage continuation           of antibiotics    ----------------------------     -----------------------------        PCT >= 0.50 ng/mL                  PCT > 0.50 ng/mL               AND         increase in PCT                  Strongly encourage                                      initiation of antibiotics    Strongly encourage escalation           of antibiotics                                     -----------------------------                                            PCT <= 0.25 ng/mL  OR                                        > 80% decrease in PCT                                      Discontinue / Do not initiate                                             antibiotics  Performed at [facility], [address] [place], [place] [address]   Basic metabolic panel     Status: Abnormal   Collection Time: 02/01/22  6:15 PM  Result Value Ref Range   Sodium 131 (L) 135 - 145 mmol/L   Potassium 3.8 3.5 - 5.1 mmol/L    Comment: HEMOLYSIS AT THIS LEVEL MAY AFFECT RESULT   Chloride 99 98 - 111 mmol/L   CO2 17 (L) 22 - 32 mmol/L   Glucose, Bld 42 (LL) 70 - 99 mg/dL    Comment: CRITICAL RESULT CALLED TO, READ BACK BY AND VERIFIED WITH ALYCIA BEVERLY _0  ON 02/01/22 SKL Glucose reference range applies only to samples taken after fasting for at least 8 hours.    BUN 19 6 - 20 mg/dL   Creatinine, Ser 1.02 0.61 - 1.24 mg/dL   Calcium 7.8 (L) 8.9 - 10.3 mg/dL   GFR, Estimated >60 >60 mL/min    Comment: (NOTE) Calculated using the CKD-EPI Creatinine Equation (2021)    Anion gap 15 5 - 15    Comment: Performed at [facility], [address] [place], [place] [address]  Phosphorus     Status: Abnormal   Collection Time: 02/01/22  6:15 PM  Result Value Ref Range   Phosphorus 2.3 (L) 2.5 - 4.6 mg/dL    Comment: Performed at [facility], [address] [place], [place] [address]  HIV Antibody (routine testing w rflx)     Status: None   Collection Time: 02/01/22  6:49 PM  Result Value Ref Range   HIV Screen 4th Generation wRfx Non Reactive Non Reactive    Comment: Performed at [facility], [address], [place], [place] [address]  Osmolality     Status: None   Collection Time: 02/01/22  6:49 PM  Result Value Ref Range   Osmolality 277 275 - 295 mOsm/kg    Comment: REPEATED TO VERIFY Performed at [facility], [address] [place], [place] [address]   SARS Coronavirus 2 by RT PCR (hospital order, performed in [facility] hospital lab) *cepheid single result test* Anterior Nasal Swab     Status: None   Collection Time: 02/01/22  6:50 PM   Specimen: Anterior Nasal Swab  Result Value Ref Range   SARS Coronavirus 2 by RT PCR NEGATIVE NEGATIVE    Comment: (NOTE) SARS-CoV-2 target nucleic acids are NOT DETECTED.  The SARS-CoV-2 RNA is generally detectable in upper and lower respiratory specimens during the acute phase of infection. The lowest concentration of SARS-CoV-2 viral copies this assay can detect is 250 copies / mL. A negative result does not preclude SARS-CoV-2 infection and should not be used as the sole  basis for treatment or other patient management decisions.  A negative result may occur with improper specimen collection / handling, submission of specimen other than nasopharyngeal swab, presence of viral mutation(s) within the areas targeted by this assay, and inadequate number of viral copies (<250 copies / mL). A negative result must be combined with clinical observations, patient history, and epidemiological information.  Fact Sheet for Patients:   https://www.patel.info/  Fact Sheet for Healthcare Providers: https://hall.com/  This test is not yet approved or  cleared by the [place] FDA and has been authorized for detection and/or diagnosis of SARS-CoV-2 by FDA under an Emergency Use Authorization (EUA).  This EUA will remain in effect (meaning this test can be used) for the duration of the COVID-19 declaration under Section 564(b)(1) of the Act, 21 U.S.C. section 360bbb-3(b)(1), unless the authorization is terminated or revoked sooner.  Performed at [facility], [address] [place], [place] [address]   CBG monitoring, ED     Status: Abnormal   Collection Time: 02/01/22  7:51 PM  Result Value Ref Range    Glucose-Capillary 103 (H) 70 - 99 mg/dL    Comment: Glucose reference range applies only to samples taken after fasting for at least 8 hours.  Lactic acid, plasma     Status: None   Collection Time: 02/01/22  8:33 PM  Result Value Ref Range   Lactic Acid, Venous 1.4 0.5 - 1.9 mmol/L    Comment: Performed at [facility], [address] [place], [place] [address]  Sodium, urine, random     Status: None   Collection Time: 02/02/22  1:22 AM  Result Value Ref Range   Sodium, Ur 84 mmol/L    Comment: Performed at [facility], [address] [place], [place] [address]  Strep pneumoniae urinary antigen     Status: None   Collection Time: 02/02/22  1:22 AM  Result Value Ref Range   Strep Pneumo Urinary Antigen NEGATIVE NEGATIVE    Comment:        Infection due to S. pneumoniae cannot be absolutely ruled out since the antigen present may be below the detection limit of the test. Performed at [facility], [place] [address], [place], [place] [address]   Basic metabolic panel     Status: Abnormal   Collection Time: 02/02/22  1:43 AM  Result Value Ref Range   Sodium 132 (L) 135 - 145 mmol/L   Potassium 4.5 3.5 - 5.1 mmol/L   Chloride 102 98 - 111 mmol/L   CO2 17 (L) 22 - 32 mmol/L   Glucose, Bld 91 70 - 99 mg/dL    Comment: Glucose reference range applies only to samples taken after fasting for at least 8 hours.   BUN 18 6 - 20 mg/dL   Creatinine, Ser 1.06 0.61 - 1.24 mg/dL   Calcium 7.9 (L) 8.9 - 10.3 mg/dL   GFR, Estimated >60 >60 mL/min    Comment: (NOTE) Calculated using the CKD-EPI Creatinine Equation (2021)    Anion gap 13 5 - 15    Comment: Performed at [facility], [address] [place],  [address]  CBC     Status: Abnormal   Collection Time: 02/02/22  1:43 AM  Result Value Ref Range   WBC 19.4 (H) 4.0 - 10.5 K/uL   RBC 2.96 (L) 4.22 - 5.81 MIL/uL   Hemoglobin 10.4 (L) 13.0 - 17.0 g/dL   HCT 30.6 (L) 39.0 - 52.0 %  MCV 103.4  (H) 80.0 - 100.0 fL   MCH 35.1 (H) 26.0 - 34.0 pg   MCHC 34.0 30.0 - 36.0 g/dL   RDW 14.0 11.5 - 15.5 %   Platelets 38 (L) 150 - 400 K/uL    Comment: Immature Platelet Fraction may be clinically indicated, consider ordering this additional test [id number] PLATELET COUNT CONFIRMED BY SMEAR    nRBC 0.0 0.0 - 0.2 %    Comment: Performed at [facility], [address], [place], [place] [address]  Magnesium     Status: None   Collection Time: 02/02/22  1:43 AM  Result Value Ref Range   Magnesium 2.4 1.7 - 2.4 mg/dL    Comment: Performed at [facility], [address] [place], [place] [address]  CBG monitoring, ED     Status: Abnormal   Collection Time: 02/02/22  1:43 AM  Result Value Ref Range   Glucose-Capillary 105 (H) 70 - 99 mg/dL    Comment: Glucose reference range applies only to samples taken after fasting for at least 8 hours.   Comment 1 Notify RN   CBG monitoring, ED     Status: Abnormal   Collection Time: 02/02/22  5:22 AM  Result Value Ref Range   Glucose-Capillary 103 (H) 70 - 99 mg/dL    Comment: Glucose reference range applies only to samples taken after fasting for at least 8 hours.  CBG monitoring, ED     Status: Abnormal   Collection Time: 02/02/22  7:54 AM  Result Value Ref Range   Glucose-Capillary 100 (H) 70 - 99 mg/dL    Comment: Glucose reference range applies only to samples taken after fasting for at least 8 hours.  POC CBG, ED     Status: Abnormal   Collection Time: 02/02/22 11:53 AM  Result Value Ref Range   Glucose-Capillary 183 (H) 70 - 99 mg/dL    Comment: Glucose reference range applies only to samples taken after fasting for at least 8 hours.  Basic metabolic panel     Status: Abnormal   Collection Time: 02/02/22 12:50 PM  Result Value Ref Range   Sodium 133 (L) 135 - 145 mmol/L   Potassium 4.0 3.5 - 5.1 mmol/L   Chloride 103 98 - 111 mmol/L   CO2 19 (L) 22 - 32 mmol/L   Glucose, Bld 222 (H) 70 - 99 mg/dL    Comment:  Glucose reference range applies only to samples taken after fasting for at least 8 hours.   BUN 22 (H) 6 - 20 mg/dL   Creatinine, Ser 1.13 0.61 - 1.24 mg/dL   Calcium 8.2 (L) 8.9 - 10.3 mg/dL   GFR, Estimated >60 >60 mL/min    Comment: (NOTE) Calculated using the CKD-EPI Creatinine Equation (2021)    Anion gap 11 5 - 15    Comment: Performed at [facility], [address] [place], [place] [address]  Basic metabolic panel     Status: Abnormal   Collection Time: 02/02/22  7:41 PM  Result Value Ref Range   Sodium 132 (L) 135 - 145 mmol/L   Potassium 4.3 3.5 - 5.1 mmol/L   Chloride 104 98 - 111 mmol/L   CO2 17 (L) 22 - 32 mmol/L   Glucose, Bld 178 (H) 70 - 99 mg/dL    Comment: Glucose reference range applies only to samples taken after fasting for at least 8 hours.   BUN 27 (H) 6 - 20 mg/dL   Creatinine, Ser  1.17 0.61 - 1.24 mg/dL   Calcium 8.3 (L) 8.9 - 10.3 mg/dL   GFR, Estimated >60 >60 mL/min    Comment: (NOTE) Calculated using the CKD-EPI Creatinine Equation (2021)    Anion gap 11 5 - 15    Comment: Performed at [facility], [address] [place], [place] [address]  Glucose, capillary     Status: Abnormal   Collection Time: 02/02/22  8:57 PM  Result Value Ref Range   Glucose-Capillary 193 (H) 70 - 99 mg/dL    Comment: Glucose reference range applies only to samples taken after fasting for at least 8 hours.  Magnesium     Status: None   Collection Time: 02/03/22  5:00 AM  Result Value Ref Range   Magnesium 2.0 1.7 - 2.4 mg/dL    Comment: Performed at [facility], [address] [place], [place] [address]  Procalcitonin - Baseline     Status: None   Collection Time: 02/03/22  5:00 AM  Result Value Ref Range   Procalcitonin 20.13 ng/mL    Comment:        Interpretation: PCT >= 10 ng/mL: Important systemic inflammatory response, almost exclusively due to severe bacterial sepsis or septic shock. (NOTE)       Sepsis PCT Algorithm            Lower Respiratory Tract                                      Infection PCT Algorithm    ----------------------------     ----------------------------         PCT < 0.25 ng/mL                PCT < 0.10 ng/mL          Strongly encourage             Strongly discourage   discontinuation of antibiotics    initiation of antibiotics    ----------------------------     -----------------------------       PCT 0.25 - 0.50 ng/mL            PCT 0.10 - 0.25 ng/mL               OR       >80% decrease in PCT            Discourage initiation of                                            antibiotics      Encourage discontinuation           of antibiotics    ----------------------------     -----------------------------         PCT >= 0.50 ng/mL              PCT 0.26 - 0.50 ng/mL                AND       <80% decrease in PCT             Encourage initiation of  antibiotics       Encourage continuation           of antibiotics    ----------------------------     -----------------------------        PCT >= 0.50 ng/mL                  PCT > 0.50 ng/mL               AND         increase in PCT                  Strongly encourage                                      initiation of antibiotics    Strongly encourage escalation           of antibiotics                                     -----------------------------                                           PCT <= 0.25 ng/mL                                                 OR                                        > 80% decrease in PCT                                      Discontinue / Do not initiate                                             antibiotics  Performed at [facility], [address] [place], [place] [address]   CBC     Status: Abnormal   Collection Time: 02/03/22  5:27 AM  Result Value Ref Range   WBC 48.9 (H) 4.0 - 10.5 K/uL   RBC 2.50 (L) 4.22 - 5.81 MIL/uL   Hemoglobin 8.8  (L) 13.0 - 17.0 g/dL   HCT 24.5 (L) 39.0 - 52.0 %   MCV 98.0 80.0 - 100.0 fL   MCH 35.2 (H) 26.0 - 34.0 pg   MCHC 35.9 30.0 - 36.0 g/dL   RDW 14.0 11.5 - 15.5 %   Platelets 30 (L) 150 - 400 K/uL    Comment: Immature Platelet Fraction may be clinically indicated, consider ordering this additional test [id number]    nRBC 0.1 0.0 - 0.2 %    Comment: Performed at [facility], [address], [place], [place] [address]  Basic metabolic panel     Status: Abnormal   Collection Time: 02/03/22  5:27 AM  Result Value Ref Range   Sodium 133 (L) 135 - 145 mmol/L   Potassium 3.9 3.5 - 5.1 mmol/L   Chloride 106 98 - 111 mmol/L   CO2 20 (L) 22 - 32 mmol/L   Glucose, Bld 153 (H) 70 - 99 mg/dL    Comment: Glucose reference range applies only to samples taken after fasting for at least 8 hours.   BUN 26 (H) 6 - 20 mg/dL   Creatinine, Ser 1.11 0.61 - 1.24 mg/dL   Calcium 8.2 (L) 8.9 - 10.3 mg/dL   GFR, Estimated >60 >60 mL/min    Comment: (NOTE) Calculated using the CKD-EPI Creatinine Equation (2021)    Anion gap 7 5 - 15    Comment: Performed at [facility], [address], [place], [place] [address]  Thyroid Panel With TSH     Status: Abnormal   Collection Time: 02/03/22  5:27 AM  Result Value Ref Range   TSH 1.460 0.450 - 4.500 uIU/mL   T4, Total 4.2 (L) 4.5 - 12.0 ug/dL   T3 Uptake Ratio 31 24 - 39 %   Free Thyroxine Index 1.3 1.2 - 4.9    Comment: (NOTE) Performed At: [facility] [place], [place] [id number] Rush Farmer MD [id number]   Glucose, capillary     Status: Abnormal   Collection Time: 02/03/22  8:08 AM  Result Value Ref Range   Glucose-Capillary 159 (H) 70 - 99 mg/dL    Comment: Glucose reference range applies only to samples taken after fasting for at least 8 hours.  Glucose, capillary     Status: Abnormal   Collection Time: 02/03/22 11:22 AM  Result Value Ref Range   Glucose-Capillary 157 (H) 70 - 99 mg/dL    Comment:  Glucose reference range applies only to samples taken after fasting for at least 8 hours.  MRSA Next Gen by PCR, Nasal     Status: None   Collection Time: 02/03/22 12:31 PM   Specimen: Nasal Mucosa; Nasal Swab  Result Value Ref Range   MRSA by PCR Next Gen NOT DETECTED NOT DETECTED    Comment: (NOTE) The GeneXpert MRSA Assay (FDA approved for NASAL specimens only), is one component of a comprehensive MRSA colonization surveillance program. It is not intended to diagnose MRSA infection nor to guide or monitor treatment for MRSA infections. Test performance is not FDA approved in patients less than 51 years old. Performed at [facility], [address] [place], [place] [address]   ECHOCARDIOGRAM COMPLETE     Status: None   Collection Time: 02/03/22  3:04 PM  Result Value Ref Range   Weight 2,049.4 oz   Height 73 in   BP 123/87 mmHg   S' Lateral 2.95 cm   Area-P 1/2 4.19 cm2  Glucose, capillary     Status: Abnormal   Collection Time: 02/03/22  4:45 PM  Result Value Ref Range   Glucose-Capillary 180 (H) 70 - 99 mg/dL    Comment: Glucose reference range applies only to samples taken after fasting for at least 8 hours.  Glucose, capillary     Status: Abnormal   Collection Time: 02/03/22  9:26 PM  Result Value Ref Range   Glucose-Capillary 157 (H) 70 - 99 mg/dL    Comment: Glucose reference range applies only to samples taken after fasting for at least 8 hours.  Glucose, capillary     Status: Abnormal   Collection Time: 02/04/22  3:53 AM  Result Value Ref Range   Glucose-Capillary 141 (H) 70 - 99 mg/dL    Comment: Glucose reference range applies only to samples taken after fasting for at least 8 hours.  CBC with Differential/Platelet     Status: Abnormal   Collection Time: 02/04/22  5:00 AM  Result Value Ref Range   WBC 63.0 (HH) 4.0 - 10.5 K/uL    Comment: This critical result has verified and been called to ROSE NGU by Janeece Agee on 07 29 2023 at 0703, and has  been read back.    RBC 2.47 (L) 4.22 - 5.81 MIL/uL   Hemoglobin 8.7 (L) 13.0 - 17.0 g/dL   HCT 24.6 (L) 39.0 - 52.0 %   MCV 99.6 80.0 - 100.0 fL   MCH 35.2 (H) 26.0 - 34.0 pg   MCHC 35.4 30.0 - 36.0 g/dL   RDW 14.9 11.5 - 15.5 %   Platelets 39 (L) 150 - 400 K/uL    Comment: Immature Platelet Fraction may be clinically indicated, consider ordering this additional test [id number]    nRBC 0.4 (H) 0.0 - 0.2 %   Neutrophils Relative % 65 %   Neutro Abs 41.2 (H) 1.7 - 7.7 K/uL   Lymphocytes Relative 1 %   Lymphs Abs 0.5 (L) 0.7 - 4.0 K/uL   Monocytes Relative 8 %   Monocytes Absolute 5.0 (H) 0.1 - 1.0 K/uL   Eosinophils Relative 0 %   Eosinophils Absolute 0.0 0.0 - 0.5 K/uL   Basophils Relative 0 %   Basophils Absolute 0.1 0.0 - 0.1 K/uL   WBC Morphology      MODERATE LEFT SHIFT (>5% METAS AND MYELOS,OCC PRO NOTED)   RBC Morphology MORPHOLOGY UNREMARKABLE    Smear Review PLATELETS APPEAR DECREASED    Immature Granulocytes 26 %   Abs Immature Granulocytes 16.27 (H) 0.00 - 0.07 K/uL   Smudge Cells PRESENT     Comment: Performed at [facility], [address], [place], [place] [address]  Basic metabolic panel     Status: Abnormal   Collection Time: 02/04/22  5:00 AM  Result Value Ref Range   Sodium 136 135 - 145 mmol/L   Potassium 4.1 3.5 - 5.1 mmol/L   Chloride 110 98 - 111 mmol/L   CO2 20 (L) 22 - 32 mmol/L   Glucose, Bld 134 (H) 70 - 99 mg/dL    Comment: Glucose reference range applies only to samples taken after fasting for at least 8 hours.   BUN 32 (H) 6 - 20 mg/dL   Creatinine, Ser 1.07 0.61 - 1.24 mg/dL   Calcium 8.9 8.9 - 10.3 mg/dL   GFR, Estimated >60 >60 mL/min    Comment: (NOTE) Calculated using the CKD-EPI Creatinine Equation (2021)    Anion gap 6 5 - 15    Comment: Performed at [facility], [address] [place], [place] [address]  Glucose, capillary     Status: Abnormal   Collection Time: 02/04/22  7:18 AM  Result Value Ref Range    Glucose-Capillary 151 (H) 70 - 99 mg/dL    Comment: Glucose reference range applies only to samples taken after fasting for at least 8 hours.  Glucose, capillary     Status: Abnormal   Collection Time: 02/04/22 11:14 AM  Result Value Ref Range   Glucose-Capillary 137 (H) 70 - 99 mg/dL    Comment: Glucose reference range applies only to samples taken after fasting for at least 8 hours.  Glucose, capillary  Status: Abnormal   Collection Time: 02/04/22  4:39 PM  Result Value Ref Range   Glucose-Capillary 139 (H) 70 - 99 mg/dL    Comment: Glucose reference range applies only to samples taken after fasting for at least 8 hours.  Glucose, capillary     Status: Abnormal   Collection Time: 02/04/22  8:12 PM  Result Value Ref Range   Glucose-Capillary 115 (H) 70 - 99 mg/dL    Comment: Glucose reference range applies only to samples taken after fasting for at least 8 hours.  Glucose, capillary     Status: Abnormal   Collection Time: 02/05/22 12:04 AM  Result Value Ref Range   Glucose-Capillary 138 (H) 70 - 99 mg/dL    Comment: Glucose reference range applies only to samples taken after fasting for at least 8 hours.  CBC     Status: Abnormal   Collection Time: 02/05/22  5:00 AM  Result Value Ref Range   WBC 74.1 (HH) 4.0 - 10.5 K/uL    Comment: CRITICAL VALUE NOTED.  VALUE IS CONSISTENT WITH PREVIOUSLY REPORTED AND CALLED VALUE.   RBC 2.43 (L) 4.22 - 5.81 MIL/uL   Hemoglobin 8.6 (L) 13.0 - 17.0 g/dL   HCT 24.1 (L) 39.0 - 52.0 %   MCV 99.2 80.0 - 100.0 fL   MCH 35.4 (H) 26.0 - 34.0 pg   MCHC 35.7 30.0 - 36.0 g/dL   RDW 15.2 11.5 - 15.5 %   Platelets 48 (L) 150 - 400 K/uL    Comment: Immature Platelet Fraction may be clinically indicated, consider ordering this additional test [id number]    nRBC 1.2 (H) 0.0 - 0.2 %    Comment: Performed at [facility], [address] [place], [place] [address]  Basic metabolic panel     Status: Abnormal   Collection Time: 02/05/22  5:00 AM   Result Value Ref Range   Sodium 137 135 - 145 mmol/L   Potassium 4.4 3.5 - 5.1 mmol/L   Chloride 110 98 - 111 mmol/L   CO2 21 (L) 22 - 32 mmol/L   Glucose, Bld 108 (H) 70 - 99 mg/dL    Comment: Glucose reference range applies only to samples taken after fasting for at least 8 hours.   BUN 33 (H) 6 - 20 mg/dL   Creatinine, Ser 0.90 0.61 - 1.24 mg/dL   Calcium 9.0 8.9 - 10.3 mg/dL   GFR, Estimated >60 >60 mL/min    Comment: (NOTE) Calculated using the CKD-EPI Creatinine Equation (2021)    Anion gap 6 5 - 15    Comment: Performed at [facility], [address] [place], [place] [address]  Magnesium     Status: None   Collection Time: 02/05/22  5:00 AM  Result Value Ref Range   Magnesium 2.0 1.7 - 2.4 mg/dL    Comment: Performed at [facility], [address] [place],  [address]  Glucose, capillary     Status: Abnormal   Collection Time: 02/05/22  5:50 AM  Result Value Ref Range   Glucose-Capillary 113 (H) 70 - 99 mg/dL    Comment: Glucose reference range applies only to samples taken after fasting for at least 8 hours.  Glucose, capillary     Status: Abnormal   Collection Time: 02/05/22  8:02 AM  Result Value Ref Range   Glucose-Capillary 112 (H) 70 - 99 mg/dL    Comment: Glucose reference range applies only to samples taken after fasting for at least  8 hours.  Comprehensive metabolic panel     Status: Abnormal   Collection Time: 02/10/22  8:40 AM  Result Value Ref Range   Sodium 138 135 - 145 mmol/L   Potassium 3.6 3.5 - 5.1 mmol/L   Chloride 103 98 - 111 mmol/L   CO2 23 22 - 32 mmol/L   Glucose, Bld 102 (H) 70 - 99 mg/dL    Comment: Glucose reference range applies only to samples taken after fasting for at least 8 hours.   BUN 18 6 - 20 mg/dL   Creatinine, Ser 1.00 0.61 - 1.24 mg/dL   Calcium 8.1 (L) 8.9 - 10.3 mg/dL   Total Protein 5.9 (L) 6.5 - 8.1 g/dL   Albumin 2.8 (L) 3.5 - 5.0 g/dL   AST 31 15 - 41 U/L   ALT 21 0 - 44 U/L   Alkaline  Phosphatase 125 38 - 126 U/L   Total Bilirubin 0.6 0.3 - 1.2 mg/dL   GFR, Estimated >60 >60 mL/min    Comment: (NOTE) Calculated using the CKD-EPI Creatinine Equation (2021)    Anion gap 12 5 - 15    Comment: Performed at [facility], [address] [place], [place] [address]  CBC with Differential     Status: Abnormal   Collection Time: 02/10/22  8:40 AM  Result Value Ref Range   WBC 30.6 (H) 4.0 - 10.5 K/uL   RBC 2.59 (L) 4.22 - 5.81 MIL/uL   Hemoglobin 9.3 (L) 13.0 - 17.0 g/dL   HCT 27.0 (L) 39.0 - 52.0 %   MCV 104.2 (H) 80.0 - 100.0 fL   MCH 35.9 (H) 26.0 - 34.0 pg   MCHC 34.4 30.0 - 36.0 g/dL   RDW 17.0 (H) 11.5 - 15.5 %   Platelets 91 (L) 150 - 400 K/uL    Comment: SPECIMEN CHECKED FOR CLOTS   nRBC 2.8 (H) 0.0 - 0.2 %   Neutrophils Relative % 92 %   Neutro Abs 28.1 (H) 1.7 - 7.7 K/uL   Lymphocytes Relative 3 %   Lymphs Abs 1.0 0.7 - 4.0 K/uL   Monocytes Relative 3 %   Monocytes Absolute 0.8 0.1 - 1.0 K/uL   Eosinophils Relative 0 %   Eosinophils Absolute 0.0 0.0 - 0.5 K/uL   Basophils Relative 0 %   Basophils Absolute 0.1 0.0 - 0.1 K/uL   Immature Granulocytes 2 %   Abs Immature Granulocytes 0.63 (H) 0.00 - 0.07 K/uL    Comment: Performed at [facility], [address] [place], [place] [address]  CBC with Differential     Status: Abnormal   Collection Time: 02/10/22 10:35 AM  Result Value Ref Range   WBC 34.8 (H) 4.0 - 10.5 K/uL   RBC 2.57 (L) 4.22 - 5.81 MIL/uL   Hemoglobin 9.4 (L) 13.0 - 17.0 g/dL   HCT 27.1 (L) 39.0 - 52.0 %   MCV 105.4 (H) 80.0 - 100.0 fL   MCH 36.6 (H) 26.0 - 34.0 pg   MCHC 34.7 30.0 - 36.0 g/dL   RDW 17.2 (H) 11.5 - 15.5 %   Platelets 96 (L) 150 - 400 K/uL    Comment: Immature Platelet Fraction may be clinically indicated, consider ordering this additional test [id number] PLATELET COUNT CONFIRMED BY SMEAR    nRBC 2.0 (H) 0.0 - 0.2 %   Neutrophils Relative % 95 %   Neutro Abs 33.2 (H) 1.7 - 7.7 K/uL   Lymphocytes  Relative 1 %  Lymphs Abs 0.2 (L) 0.7 - 4.0 K/uL   Monocytes Relative 2 %   Monocytes Absolute 0.6 0.1 - 1.0 K/uL   Eosinophils Relative 0 %   Eosinophils Absolute 0.0 0.0 - 0.5 K/uL   Basophils Relative 0 %   Basophils Absolute 0.1 0.0 - 0.1 K/uL   WBC Morphology TOXIC GRANULATION    Smear Review PLATELETS APPEAR DECREASED    Immature Granulocytes 2 %   Abs Immature Granulocytes 0.74 (H) 0.00 - 0.07 K/uL   Polychromasia PRESENT     Comment: Performed at [facility], [address] [place], [place] [address]  Basic metabolic panel     Status: Abnormal   Collection Time: 02/10/22 10:35 AM  Result Value Ref Range   Sodium 139 135 - 145 mmol/L   Potassium 3.5 3.5 - 5.1 mmol/L   Chloride 106 98 - 111 mmol/L   CO2 22 22 - 32 mmol/L   Glucose, Bld 151 (H) 70 - 99 mg/dL    Comment: Glucose reference range applies only to samples taken after fasting for at least 8 hours.   BUN 17 6 - 20 mg/dL   Creatinine, Ser 1.05 0.61 - 1.24 mg/dL   Calcium 7.9 (L) 8.9 - 10.3 mg/dL   GFR, Estimated >60 >60 mL/min    Comment: (NOTE) Calculated using the CKD-EPI Creatinine Equation (2021)    Anion gap 11 5 - 15    Comment: Performed at [facility], [address] [place], [place] [address]  Blood Culture (routine x 2)     Status: None   Collection Time: 02/10/22 12:05 PM   Specimen: BLOOD RIGHT HAND  Result Value Ref Range   Specimen Description BLOOD RIGHT HAND    Special Requests      BOTTLES DRAWN AEROBIC AND ANAEROBIC Blood Culture adequate volume   Culture      NO GROWTH 5 DAYS Performed at [facility], [address] [place], [place] [address]    Report Status 02/15/2022 FINAL   Lactic acid, plasma     Status: Abnormal   Collection Time: 02/10/22 12:15 PM  Result Value Ref Range   Lactic Acid, Venous 3.7 (HH) 0.5 - 1.9 mmol/L    Comment: CRITICAL RESULT CALLED TO, READ BACK BY AND VERIFIED WITH BIANCA BROWNING _0  02/10/22 MJU Performed at [facility], [address] [place], [place] [address]   Blood Culture (routine x 2)     Status: None   Collection Time: 02/10/22  3:34 PM   Specimen: BLOOD  Result Value Ref Range   Specimen Description BLOOD BLOOD RIGHT FOREARM    Special Requests      BOTTLES DRAWN AEROBIC AND ANAEROBIC Blood Culture adequate volume   Culture      NO GROWTH 5 DAYS Performed at [facility], [address] [place], [place] [address]    Report Status 02/15/2022 FINAL   Procalcitonin - Baseline     Status: None   Collection Time: 02/10/22  3:34 PM  Result Value Ref Range   Procalcitonin 0.19 ng/mL    Comment:        Interpretation: PCT (Procalcitonin) <= 0.5 ng/mL: Systemic infection (sepsis) is not likely. Local bacterial infection is possible. (NOTE)       Sepsis PCT Algorithm           Lower Respiratory Tract  Infection PCT Algorithm    ----------------------------     ----------------------------         PCT < 0.25 ng/mL                PCT < 0.10 ng/mL          Strongly encourage             Strongly discourage   discontinuation of antibiotics    initiation of antibiotics    ----------------------------     -----------------------------       PCT 0.25 - 0.50 ng/mL            PCT 0.10 - 0.25 ng/mL               OR       >80% decrease in PCT            Discourage initiation of                                            antibiotics      Encourage discontinuation           of antibiotics    ----------------------------     -----------------------------         PCT >= 0.50 ng/mL              PCT 0.26 - 0.50 ng/mL               AND        <80% decrease in PCT             Encourage initiation of                                             antibiotics       Encourage continuation           of antibiotics    ----------------------------     -----------------------------        PCT >= 0.50 ng/mL                  PCT > 0.50 ng/mL                AND         increase in PCT                  Strongly encourage                                      initiation of antibiotics    Strongly encourage escalation           of antibiotics                                     -----------------------------                                           PCT <= 0.25 ng/mL  OR                                        > 80% decrease in PCT                                      Discontinue / Do not initiate                                             antibiotics  Performed at [facility], [address] [place], [place] [address]   Lactic acid, plasma     Status: Abnormal   Collection Time: 02/10/22  3:34 PM  Result Value Ref Range   Lactic Acid, Venous 2.3 (HH) 0.5 - 1.9 mmol/L    Comment: CRITICAL VALUE NOTED. VALUE IS CONSISTENT WITH PREVIOUSLY REPORTED/CALLED VALUE  SB Performed at [facility], [place], [place] [address]   Lactic acid, plasma     Status: Abnormal   Collection Time: 02/10/22  6:33 PM  Result Value Ref Range   Lactic Acid, Venous 2.7 (HH) 0.5 - 1.9 mmol/L    Comment: CRITICAL VALUE NOTED. VALUE IS CONSISTENT WITH PREVIOUSLY REPORTED/CALLED VALUE DLB Performed at [facility], [address] [place], [place] [address]   Basic metabolic panel     Status: Abnormal   Collection Time: 02/11/22 11:55 AM  Result Value Ref Range   Sodium 139 135 - 145 mmol/L   Potassium 3.5 3.5 - 5.1 mmol/L   Chloride 106 98 - 111 mmol/L   CO2 23 22 - 32 mmol/L   Glucose, Bld 117 (H) 70 - 99 mg/dL    Comment: Glucose reference range applies only to samples taken after fasting for at least 8 hours.   BUN 18 6 - 20 mg/dL   Creatinine, Ser 0.90 0.61 - 1.24 mg/dL   Calcium 8.4 (L) 8.9 - 10.3 mg/dL   GFR, Estimated >60 >60 mL/min    Comment: (NOTE) Calculated using the CKD-EPI Creatinine Equation (2021)    Anion gap 10 5 - 15    Comment: Performed at  [facility], [address] [place], [place] [address]  CBC     Status: Abnormal   Collection Time: 02/11/22 11:55 AM  Result Value Ref Range   WBC 29.4 (H) 4.0 - 10.5 K/uL   RBC 2.50 (L) 4.22 - 5.81 MIL/uL   Hemoglobin 8.8 (L) 13.0 - 17.0 g/dL   HCT 26.5 (L) 39.0 - 52.0 %   MCV 106.0 (H) 80.0 - 100.0 fL   MCH 35.2 (H) 26.0 - 34.0 pg   MCHC 33.2 30.0 - 36.0 g/dL   RDW 17.1 (H) 11.5 - 15.5 %   Platelets 79 (L) 150 - 400 K/uL    Comment: Immature Platelet Fraction may be clinically indicated, consider ordering this additional test [id number]    nRBC 0.9 (H) 0.0 - 0.2 %    Comment: Performed at [facility], [address] [place], [place] [address]  MTB-RIF NAA with AFB Culture, sputum     Status: None (Preliminary result)   Collection Time: 02/11/22  2:29 PM   Specimen: Expectorated Sputum  Result Value Ref  Range   Myco tuberculosis Complex Comment NOT DETECTED    Comment: Mycobacterium tuberculosis complex (MTBC) NOT detected.   Rifampin Comment Susceptible    Comment: (NOTE) Because Mycobacterium tuberculosis complex (MTBC) was not detected, no rifampin determination is possible.    AFB Specimen Processing Concentration     Comment: (NOTE) Performed At: [facility] [place], [place] [id number] Rush Farmer MD [id number]    Acid Fast Culture PENDING    Source (MTB RIF) SPU     Comment: Performed at [facility], [address] [place], [place] [address]  MTB-RIF NAA with AFB Culture, sputum     Status: None (Preliminary result)   Collection Time: 02/11/22  6:00 PM   Specimen: Expectorated Sputum  Result Value Ref Range   Myco tuberculosis Complex Comment NOT DETECTED    Comment: Mycobacterium tuberculosis complex (MTBC) NOT detected.   Rifampin Comment Susceptible    Comment: (NOTE) Because Mycobacterium tuberculosis complex (MTBC) was not detected, no rifampin determination is possible.    AFB Specimen  Processing Concentration     Comment: (NOTE) Performed At: [facility] [place], [place] [id number] Rush Farmer MD [id number]    Acid Fast Culture PENDING    Source (MTB RIF) SPUTUM     Comment: Performed at [facility], [address] [place], [place] [address]  CBC     Status: Abnormal   Collection Time: 02/12/22  5:00 AM  Result Value Ref Range   WBC 37.0 (H) 4.0 - 10.5 K/uL   RBC 2.30 (L) 4.22 - 5.81 MIL/uL   Hemoglobin 8.3 (L) 13.0 - 17.0 g/dL   HCT 24.1 (L) 39.0 - 52.0 %   MCV 104.8 (H) 80.0 - 100.0 fL   MCH 36.1 (H) 26.0 - 34.0 pg   MCHC 34.4 30.0 - 36.0 g/dL   RDW 16.9 (H) 11.5 - 15.5 %   Platelets 77 (L) 150 - 400 K/uL    Comment: Immature Platelet Fraction may be clinically indicated, consider ordering this additional test [id number]    nRBC 0.2 0.0 - 0.2 %    Comment: Performed at [facility], [address] [place], [place] [address]  MTB-RIF NAA with AFB Culture, sputum     Status: None (Preliminary result)   Collection Time: 02/12/22 10:40 AM   Specimen: Expectorated Sputum  Result Value Ref Range   Myco tuberculosis Complex Comment NOT DETECTED    Comment: Mycobacterium tuberculosis complex (MTBC) NOT detected.   Rifampin Comment Susceptible    Comment: (NOTE) Because Mycobacterium tuberculosis complex (MTBC) was not detected, no rifampin determination is possible.    AFB Specimen Processing Concentration     Comment: (NOTE) Performed At: [facility] [place], [place] [id number] Rush Farmer MD [id number]    Acid Fast Culture PENDING    Source (MTB RIF) SPUTUM     Comment: Performed at [facility], [address] [place], [place] [address]  CBC with Differential/Platelet     Status: Abnormal   Collection Time: 02/15/22  8:52 AM  Result Value Ref Range   WBC 26.5 (H) 4.0 - 10.5 K/uL   RBC 2.34 (L) 4.22 - 5.81 MIL/uL   Hemoglobin 8.5 (L) 13.0 - 17.0 g/dL   HCT  24.6 (L) 39.0 - 52.0 %   MCV 105.1 (H) 80.0 - 100.0 fL   MCH 36.3 (H) 26.0 - 34.0 pg   MCHC 34.6 30.0 - 36.0 g/dL   RDW 16.7 (  H) 11.5 - 15.5 %   Platelets 108 (L) 150 - 400 K/uL   nRBC 0.1 0.0 - 0.2 %   Neutrophils Relative % 94 %   Neutro Abs 24.8 (H) 1.7 - 7.7 K/uL   Lymphocytes Relative 1 %   Lymphs Abs 0.3 (L) 0.7 - 4.0 K/uL   Monocytes Relative 4 %   Monocytes Absolute 1.1 (H) 0.1 - 1.0 K/uL   Eosinophils Relative 0 %   Eosinophils Absolute 0.0 0.0 - 0.5 K/uL   Basophils Relative 0 %   Basophils Absolute 0.1 0.0 - 0.1 K/uL   Immature Granulocytes 1 %   Abs Immature Granulocytes 0.27 (H) 0.00 - 0.07 K/uL    Comment: Performed at [facility], [address] [place], [place] [address]  Comprehensive metabolic panel     Status: Abnormal   Collection Time: 02/15/22  8:52 AM  Result Value Ref Range   Sodium 139 135 - 145 mmol/L   Potassium 4.0 3.5 - 5.1 mmol/L   Chloride 102 98 - 111 mmol/L   CO2 27 22 - 32 mmol/L   Glucose, Bld 125 (H) 70 - 99 mg/dL    Comment: Glucose reference range applies only to samples taken after fasting for at least 8 hours.   BUN 11 6 - 20 mg/dL   Creatinine, Ser 0.88 0.61 - 1.24 mg/dL   Calcium 8.5 (L) 8.9 - 10.3 mg/dL   Total Protein 6.1 (L) 6.5 - 8.1 g/dL   Albumin 2.8 (L) 3.5 - 5.0 g/dL   AST 21 15 - 41 U/L   ALT 19 0 - 44 U/L   Alkaline Phosphatase 101 38 - 126 U/L   Total Bilirubin 0.4 0.3 - 1.2 mg/dL   GFR, Estimated >60 >60 mL/min    Comment: (NOTE) Calculated using the CKD-EPI Creatinine Equation (2021)    Anion gap 10 5 - 15    Comment: Performed at [facility], [address] [place], [place] [address]  Protein, urine, random     Status: None   Collection Time: 02/15/22  8:52 AM  Result Value Ref Range   Total Protein, Urine 26 mg/dL    Comment: NO NORMAL RANGE ESTABLISHED FOR THIS TEST Performed at [facility], [address], [place], [place] [address]    Representative image from CT chest  performed 15 Nov 2021:   Representative image from CT angio chest performed 01 February 2022:   Representative image from CT chest obtained for August 2023:   Representative image from CT performed 22 February 2022:   Can be seen above this process has had quite rapid evolution with previous findings consistent with infection to include elevated procalcitonin and leukocytosis.  Unfortunately, appropriate sputum specimens were not obtained during hospitalization.    Assessment & Plan:     ICD-10-CM   1. COPD mixed type ([place])  J44.9 Ambulatory Referral for DME    Respiratory or Resp and Sputum Culture    AFB culture with smear   Continue Symbicort, Spiriva, and as needed albuterol MDI Nebulizer for albuterol as needed Patient to use nebulizer for shortness of breath, wheezing    2. Cavitary lesion of lung  J98.4    Appears to be infectious/inflammatory Obtain sputum culture/AFB (suspect MAC)    3. Squamous cell carcinoma of right lung [facility])  C34.91    This issue adds complexity to his management May have recurrence    4. Severe pulmonary hypertension (HCC)  I27.20  This issue adds complexity to his management Likely multifactorial in origin Prior thromboembolic disease Currently on anticoagulation     5. Chronic diastolic CHF (congestive heart failure) (HCC)  I50.32    This issue adds complexity to his management    6. Severe protein-calorie malnutrition ([place])  E43    This issue adds complexity to his management Increases risk for invasive procedures     Orders Placed This Encounter  Procedures   Respiratory or Resp and Sputum Culture    Standing Status:   Future    Standing Expiration Date:   03/02/2023   AFB culture with smear    Suspect MAC    Standing Status:   Future    Standing Expiration Date:   03/02/2023   Ambulatory Referral for DME    Referral Priority:   Routine    Referral Type:   Durable Medical Equipment Purchase    Number of Visits Requested:   1    Meds ordered this encounter  Medications   albuterol (PROVENTIL) (2.5 MG/3ML) 0.083% nebulizer solution    Sig: Take 3 mLs (2.5 mg total) by nebulization every 6 (six) hours as needed for wheezing or shortness of breath.    Dispense:  120 mL    Refill:  5    Dx J44.9   Patient appears to be very debilitated still.  Process in the lung appears to be inflammatory/infectious.  Will obtain sputum to try to identify potential organism.  Unfortunately with the patient's debility, severe protein calorie malnutrition and SEVERE pulmonary hypertension he is at greater risk for increased morbidity/mortality from invasive procedures.  In particular, severe pulmonary hypertension will make it riskier for potential hemorrhagic complications from lung biopsy.  Even treating pulmonary infections will be difficult given his track record with recurrent C. difficile.  We will add nebulization therapy to help patient clear secretions.  Addressing end-of-life issues is also recommended as the patient appears to be declining functionally from his multiple comorbidities.  We will see the patient in follow-up in 4 to 6 weeks time he is to contact us prior to that time should any new difficulties arise.  Total visit time 43 minutes.  Renold Don, MD Advanced Bronchoscopy PCCM [facility]    *This note was dictated using voice recognition software/Dragon.  Despite best efforts to proofread, errors can occur which can change the meaning. Any transcriptional errors that result from this process are unintentional and may not be fully corrected at the time of dictation.

## 2022-03-01 NOTE — Patient Instructions (Signed)
We are collecting 2 different types of samples to check for regular bugs in for other bugs that can cause the findings in your lung.  We are providing you with a nebulizer machine so you can use the machine up to 3-4 times a day if you get short of breath, have wheezing or have difficulty bringing phlegm up.  Once we know what bugs are growing from your lung then we can determine when to repeat the CT scan of the chest.  We will see you in follow-up in 4 to 6 weeks time call sooner should any new problems arise.

## 2022-03-01 NOTE — Progress Notes (Signed)
Radiation Oncology Follow up Note  Name: EBONY RICKEL   Date:   03/01/2022 MRN:  [id number] DOB: Apr 11, 1966    This 56 y.o. 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  REFERRING PROVIDER: Tracie Harrier, MD  HPI: Patient is a 56 year old male now out 3 years having completed radiation therapy with concurrent chemotherapy for hilar recurrence status post right upper lobectomy for stage I squamous cell carcinoma seen today in routine follow-up he is doing well specifically [place] cough hemoptysis or chest tightness..  We are following some progressive airspace opacity in the anterior right lower lobe most likely from a cavitary pneumonia although he is being seen by Dr. Patsey Berthold.  He may eventually need bronc.  COMPLICATIONS OF TREATMENT: none  FOLLOW UP COMPLIANCE: keeps appointments   PHYSICAL EXAM:  BP (!) 162/82 (BP Location: Left Arm, Patient Position: Sitting, Cuff Size: Small)   Pulse (!) 108   Temp (!) 97 F (36.1 C) (Tympanic)   Resp 20   Wt 135 lb 11.2 oz (61.6 kg)   BMI 17.90 kg/m  Thin male in NAD.  Well-developed well-nourished patient in NAD. HEENT reveals PERLA, EOMI, discs not visualized.  Oral cavity is clear. No oral mucosal lesions are identified. Neck is clear without evidence of cervical or supraclavicular adenopathy. Lungs are clear to A&P. Cardiac examination is essentially unremarkable with regular rate and rhythm without murmur rub or thrill. Abdomen is benign with no organomegaly or masses noted. Motor sensory and DTR levels are equal and symmetric in the upper and lower extremities. Cranial nerves II through XII are grossly intact. Proprioception is intact. No peripheral adenopathy or edema is identified. No motor or sensory levels are noted. Crude visual fields are within normal range.  RADIOLOGY RESULTS: CT scans  serially reviewed compatible with above-stated findings  PLAN: At this time patient may need a PET scan in the near future as well as possible bronchoscopy to rule out possibility of local regional recurrence.  This is being handled by Dr. Patsey Berthold.  I have asked to see him back in 3 months for follow-up.  Be happy to see him sooner should this proved to be progressive disease.  Patient is aware of my evaluation and recommendations.  I would like to take this opportunity to thank you for allowing me to participate in the care of your patient.Noreene Filbert, MD

## 2022-03-02 ENCOUNTER — Telehealth: Payer: Self-pay | Admitting: Oncology

## 2022-03-02 ENCOUNTER — Telehealth: Payer: Self-pay | Admitting: *Deleted

## 2022-03-02 NOTE — Telephone Encounter (Signed)
Pt to see Josh next week. Message sent to scheduling via another ph note.

## 2022-03-02 NOTE — Telephone Encounter (Signed)
Please schedule patient to follow up with Palliative one day next week. Please inform pt of appt.

## 2022-03-02 NOTE — Telephone Encounter (Signed)
Patient called to verify his next appointment. None scheduled. Please advise how to schedule this patient. Thank you

## 2022-03-02 NOTE — Telephone Encounter (Signed)
Patient called asking when his next appointment is, He is not scheduled follow up with you. Last disposition states tbd. Please advise

## 2022-03-03 ENCOUNTER — Other Ambulatory Visit
Admission: RE | Admit: 2022-03-03 | Discharge: 2022-03-03 | Disposition: A | Discharge status: Home / Self Care | Payer: 59 | Source: Ambulatory Visit | Attending: Pulmonary Disease | Admitting: Pulmonary Disease

## 2022-03-03 DIAGNOSIS — J449 Chronic obstructive pulmonary disease, unspecified: Secondary | ICD-10-CM | POA: Insufficient documentation

## 2022-03-03 LAB — EXPECTORATED SPUTUM ASSESSMENT W GRAM STAIN, RFLX TO RESP C

## 2022-03-04 LAB — ACID FAST SMEAR (AFB, MYCOBACTERIA): Acid Fast Smear: NEGATIVE

## 2022-03-06 ENCOUNTER — Other Ambulatory Visit: Payer: Self-pay | Admitting: Pulmonary Disease

## 2022-03-06 DIAGNOSIS — J988 Other specified respiratory disorders: Secondary | ICD-10-CM

## 2022-03-06 DIAGNOSIS — J850 Gangrene and necrosis of lung: Secondary | ICD-10-CM

## 2022-03-06 NOTE — Progress Notes (Signed)
Results for orders placed or performed during the hospital encounter of 03/03/22  Acid Fast Smear (AFB)     Status: None   Collection Time: 03/03/22  1:00 PM   Specimen: Sputum  Result Value Ref Range Status   AFB Specimen Processing Concentration  Final   Acid Fast Smear Negative  Final    Comment: (NOTE) Performed At: [facility] [address] [place], [place] [id number] Rush Farmer MD [id number]    Source (AFB) EXPECTORATED SPUTUM  Final    Comment: Performed at [facility], [address] [place], [place] [address]  Expectorated Sputum Assessment w Gram Stain, Rflx to Resp Cult     Status: None   Collection Time: 03/03/22  1:00 PM   Specimen: Expectorated Sputum  Result Value Ref Range Status   Specimen Description EXPECTORATED SPUTUM  Final   Special Requests NONE  Final   Sputum evaluation   Final    THIS SPECIMEN IS ACCEPTABLE FOR SPUTUM CULTURE Performed at [facility], [address], [place], [place] [address]    Report Status 03/03/2022 FINAL  Final  Culture, Respiratory w Gram Stain     Status: None   Collection Time: 03/03/22  1:00 PM  Result Value Ref Range Status   Specimen Description   Final    EXPECTORATED SPUTUM Performed at [facility], [address] [place], [place] [address]    Special Requests   Final    NONE Reflexed from [id number] Performed at [facility], [address] [place], [place] [address]    Gram Stain   Final    ABUNDANT SQUAMOUS EPITHELIAL CELLS PRESENT ABUNDANT WBC PRESENT, PREDOMINANTLY PMN ABUNDANT GRAM NEGATIVE RODS ABUNDANT GRAM POSITIVE COCCI IN CLUSTERS IN CHAINS    Culture   Final    MODERATE PSEUDOMONAS AERUGINOSA GRAM NEGATIVE RODS SAME ID WITH DIFFERENT COLONY MORPHOLOGY. Performed at [facility], [place] [address], [place], [place] [address]    Report Status 03/06/2022 FINAL  Final   Organism ID, Bacteria PSEUDOMONAS AERUGINOSA  Final   Organism ID,  Bacteria GRAM NEGATIVE RODS  Final      Susceptibility   Pseudomonas aeruginosa - MIC*    CEFTAZIDIME 4 SENSITIVE Sensitive     CIPROFLOXACIN 2 RESISTANT Resistant     GENTAMICIN <=1 SENSITIVE Sensitive     IMIPENEM 2 SENSITIVE Sensitive     * MODERATE PSEUDOMONAS AERUGINOSA   Gram negative rods - MIC*    CEFTAZIDIME 16 INTERMEDIATE Intermediate     CIPROFLOXACIN 1 INTERMEDIATE Intermediate     GENTAMICIN <=1 SENSITIVE Sensitive     IMIPENEM 1 SENSITIVE Sensitive     * GRAM NEGATIVE RODS   Sputum collected 03 March 2022 shows Pseudomonas aeruginosa which explains the patient's findings on CT.  There appears to be 2 morphologies of Pseudomonas.  There is resistance to ciprofloxacin.  Patient will likely need IV antibiotics.  We will place consultation to infectious disease.  I have updated Dr. Tasia Catchings who referred the patient.  Renold Don, MD [facility]    *This note was dictated using voice recognition software/Dragon.  Despite best efforts to proofread, errors can occur which can change the meaning. Any transcriptional errors that result from this process are unintentional and may not be fully corrected at the time of dictation.

## 2022-03-09 ENCOUNTER — Telehealth: Payer: Self-pay | Admitting: *Deleted

## 2022-03-09 ENCOUNTER — Ambulatory Visit (HOSPITAL_BASED_OUTPATIENT_CLINIC_OR_DEPARTMENT_OTHER): Payer: 59 | Admitting: Infectious Diseases

## 2022-03-09 ENCOUNTER — Other Ambulatory Visit: Payer: Self-pay

## 2022-03-09 ENCOUNTER — Inpatient Hospital Stay (HOSPITAL_BASED_OUTPATIENT_CLINIC_OR_DEPARTMENT_OTHER): Payer: 59 | Admitting: Hospice and Palliative Medicine

## 2022-03-09 ENCOUNTER — Encounter: Payer: Self-pay | Admitting: Hospice and Palliative Medicine

## 2022-03-09 VITALS — BP 143/96 | HR 112 | Temp 98.1°F

## 2022-03-09 VITALS — BP 123/98 | HR 108 | Temp 96.8°F | Resp 18 | Ht 73.0 in | Wt 138.9 lb

## 2022-03-09 DIAGNOSIS — J440 Chronic obstructive pulmonary disease with acute lower respiratory infection: Secondary | ICD-10-CM | POA: Diagnosis not present

## 2022-03-09 DIAGNOSIS — C3491 Malignant neoplasm of unspecified part of right bronchus or lung: Secondary | ICD-10-CM

## 2022-03-09 DIAGNOSIS — Z85118 Personal history of other malignant neoplasm of bronchus and lung: Secondary | ICD-10-CM | POA: Insufficient documentation

## 2022-03-09 DIAGNOSIS — E46 Unspecified protein-calorie malnutrition: Secondary | ICD-10-CM | POA: Insufficient documentation

## 2022-03-09 DIAGNOSIS — D539 Nutritional anemia, unspecified: Secondary | ICD-10-CM | POA: Insufficient documentation

## 2022-03-09 DIAGNOSIS — R0600 Dyspnea, unspecified: Secondary | ICD-10-CM

## 2022-03-09 DIAGNOSIS — I110 Hypertensive heart disease with heart failure: Secondary | ICD-10-CM | POA: Insufficient documentation

## 2022-03-09 DIAGNOSIS — Z86718 Personal history of other venous thrombosis and embolism: Secondary | ICD-10-CM | POA: Insufficient documentation

## 2022-03-09 DIAGNOSIS — J151 Pneumonia due to Pseudomonas: Secondary | ICD-10-CM

## 2022-03-09 DIAGNOSIS — D72829 Elevated white blood cell count, unspecified: Secondary | ICD-10-CM | POA: Insufficient documentation

## 2022-03-09 DIAGNOSIS — E8809 Other disorders of plasma-protein metabolism, not elsewhere classified: Secondary | ICD-10-CM | POA: Insufficient documentation

## 2022-03-09 DIAGNOSIS — J449 Chronic obstructive pulmonary disease, unspecified: Secondary | ICD-10-CM

## 2022-03-09 DIAGNOSIS — E440 Moderate protein-calorie malnutrition: Secondary | ICD-10-CM

## 2022-03-09 DIAGNOSIS — D696 Thrombocytopenia, unspecified: Secondary | ICD-10-CM | POA: Insufficient documentation

## 2022-03-09 DIAGNOSIS — D649 Anemia, unspecified: Secondary | ICD-10-CM | POA: Diagnosis not present

## 2022-03-09 DIAGNOSIS — Z515 Encounter for palliative care: Secondary | ICD-10-CM

## 2022-03-09 MED ORDER — BENZONATATE 100 MG PO CAPS: 100.0000 mg | ORAL_CAPSULE | Freq: Three times a day (TID) | ORAL | 2 refills | Status: DC | PRN

## 2022-03-09 MED ORDER — BUDESONIDE-FORMOTEROL FUMARATE 80-4.5 MCG/ACT IN AERO: 2.0000 | INHALATION_SPRAY | Freq: Two times a day (BID) | RESPIRATORY_TRACT | 12 refills | Status: DC

## 2022-03-09 MED ORDER — HYDROCODONE-ACETAMINOPHEN 5-325 MG PO TABS: 1.0000 | ORAL_TABLET | Freq: Three times a day (TID) | ORAL | 0 refills | Status: DC | PRN

## 2022-03-09 NOTE — Telephone Encounter (Signed)
Per Merrily Pew- patient needs portable oxygen tanks. Pt doesn't want to wear oxygen tank    Needs POC/OCD evaluation.

## 2022-03-09 NOTE — Progress Notes (Signed)
[place] at [facility] Telephone:([phone] [phone]) [phone]  Patient Care Team: Earlie Server, MD as PCP - General (Oncology) Earlie Server, MD as Consulting Physician (Hematology and Oncology)   NAME OF PATIENT: Samuel Choi  [id number]  1966/05/03   DATE OF VISIT: 03/09/22   REASON FOR CONSULT: Samuel Choi is a 56 y.o. 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  He was initially diagnosed with stage I in 2020 and underwent lobectomy but had disease recurrence.  He is post Taxol Keytruda, patient had progression and poor tolerance due to myelosuppression.  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 Most recently, patient has been on docetaxel.    INTERVAL HISTORY: Patient was hospitalized 02/01/2022 to 02/05/2022 with HCAP/COPD exacerbation.  Patient was hospitalized 02/10/2022 to 02/13/2019 with [place] respiratory failure.  CT of the chest revealed cavitary lesions of the anterior right lower lobe favoring atypical infection.  Patient was sent home on O2.  Patient saw Dr. Tasia Catchings on 02/15/2022.  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  Respiratory culture on 8/25 positive for Pseudomonas aeruginosa.  Patient is pending consultation by infectious disease.  Today, patient denies significant changes or concerns.  He does have moderate shortness of breath and did not wear his O2 to clinic.  No fever or chills.  Denies any neurologic complaints. Denies any easy bleeding or bruising. Reports poor appetite and denies weight loss. Denies chest pain. Denies any nausea, vomiting, constipation, or diarrhea. Denies urinary complaints. Patient offers no further specific complaints today.  SOCIAL HISTORY:     reports that he quit smoking about 3 years ago. His smoking use  included cigarettes. He has a 7.50 pack-year smoking history. He has never used smokeless tobacco. He reports current alcohol use of about 3.0 standard drinks of alcohol per week. He reports that he does not use drugs.  Patient is married and lives at home with his wife.  He has a daughter who lives in [place].  Patient has a son who was murdered about 8 years ago.  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  ADVANCE DIRECTIVES:  Does not have  CODE STATUS: Full code   PAST MEDICAL HISTORY: Past Medical History:  Diagnosis Date   Alcohol abuse    usually drinks 2-3 drinks per day   Atherosclerosis 06/2018   Chronic sinusitis    Dehydration 02/07/2019   Emphysema of lung ([place]) 06/2018   patient unaware of this.   Hip fracture ([place]) 05/2018   no surgery   History of kidney stones 05/2018   per xray, bilateral nephrolitiasis   Hypertension    Squamous cell carcinoma of lung, right ([place]) 06/2018    PAST SURGICAL HISTORY:  Past Surgical History:  Procedure Laterality Date   BRAIN SURGERY  10/2017   nasal/sinus endoscopy. mass benign   ELECTROMAGNETIC NAVIGATION BROCHOSCOPY Right 06/25/2018   Procedure: ELECTROMAGNETIC NAVIGATION BRONCHOSCOPY;  Surgeon: [name], MD;  Location: [facility];  Service: Cardiopulmonary;  Laterality: Right;   ENDOBRONCHIAL ULTRASOUND Right 06/25/2018   Procedure: ENDOBRONCHIAL ULTRASOUND;  Surgeon: [name], MD;  Location: [facility];  Service: Cardiopulmonary;  Laterality: Right;   ENDOBRONCHIAL ULTRASOUND Right 12/26/2018   Procedure: ENDOBRONCHIAL ULTRASOUND RIGHT;  Surgeon: [name], MD;  Location: [facility];  Service: Cardiopulmonary;  Laterality: Right;   FLEXIBLE BRONCHOSCOPY N/A 08/20/2018   Procedure: FLEXIBLE BRONCHOSCOPY  PREOP;  Surgeon: [name], MD;  Location: [facility];  Service: Thoracic;  Laterality: N/A;   IR CV LINE INJECTION  04/04/2019   NASAL SINUS SURGERY  10/2017   At [facility], frontal sinusotomy, ethmoidectomy, resection  anterior cranial fossa neoplasm, turbinate resection   PERIPHERAL VASCULAR THROMBECTOMY Left 12/31/2020   Procedure: PERIPHERAL VASCULAR THROMBECTOMY;  Surgeon: Algernon Huxley, MD;  Location: [facility];  Service: Cardiovascular;  Laterality: Left;   PORTACATH PLACEMENT Left 01/15/2019   Procedure: INSERTION PORT-A-CATH;  Surgeon: [name], MD;  Location: [facility];  Service: General;  Laterality: Left;   THORACOTOMY Right 08/13/2018   Procedure: PREOP BROCHOSCOPY WITH RIGHT THORACOTOMY AND RUL RESECTION;  Surgeon: [name], MD;  Location: [facility];  Service: General;  Laterality: Right;   THORACOTOMY Right 08/20/2018   Procedure: THORACOTOMY MAJOR RIGHT UPPER LOBE LOBECTOMY;  Surgeon: [name], MD;  Location: [facility];  Service: Thoracic;  Laterality: Right;   TOE SURGERY Left    pin in left toe    HEMATOLOGY/ONCOLOGY HISTORY:  Oncology History  Squamous cell carcinoma of right lung ([place])  08/13/2018 Surgery   s/p Bronchoscopy on 06/25/2018. subcarina EBUS FNA was non diagnostic, hypocellular specimen.  08/13/18 Patient underwent right thoracotomy and wedge resection of a right upper lobe mass.  Frozen section was consistent with an inflammatory process.  On the second postop day, preliminary pathology reports high-grade malignancy.  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  08/20/2018 Patient was therefore taken back to the OR and underwent take complete lobectomy  pT1b pN0 cM0 stage I squamous cell lung cancer. Margin is negative.  Recommend observation.     08/13/2018 Initial Diagnosis   Stage I Squamous cell carcinoma of lung, right ([place])   12/03/2018 Progression   12/03/2018 CT chest showed abnormal soft tissue in the right suprahilar region measuring up to 2.5 cm, worrisome for recurrence 01/10/1286 PET Hypermetabolic RIGHT suprahilar mass consistent with lung cancer, recurrence. 12/26/2018 s/p bronchoscopy biopsy. Confirmed local recurrence of squamous cell lung  cancer.  medi port placed by Dr.Oaks. 01/20/19 MRI brain w/wo contrast: No evidence of metastatic disease   01/17/2019 - 03/07/2019 Chemotherapy   concurrent chemoradiation on 01/16/2019 Carboplatin AUC of 2 and Taxol 45 mg/m2 weekly finished in 03/05/2019    03/31/2019 Imaging   03/31/19 CT chest showed Right suprahilar lesion has decreased slightly in size in the interval since prior PET-CT.  No new or progressive findings on today's study.   04/10/2019 - 05/20/2020 Chemotherapy   04/10/2019, patient started on durvalumab Q 2 weeks maintenance.    06/14/2020 Progression   06/14/2020 CT chest w contrast showed vidence of progressive lymphangitic spread of tumor throughout the right lung, with contralateral lung nodules with  right pleural effusion  07/01/22 MRI brain is negative for CNS involvement.  CT abdomen with contrast showed no evidence of abdominal metastatic disease PET scan was not approved by insurance-peer to peer appeal with Dr.Vipul Bhanderi    07/08/2020 Cancer Staging   Staging form: Lung, AJCC 8th Edition - Clinical stage from 07/08/2020: Stage IVA (rcT4, cM1a) - Signed by Earlie Server, MD on 07/08/2020   07/16/2020 - 09/28/2021 Chemotherapy   Carboplatin + Paclitaxel + Pembrolizumab q21d x 4 cycles      10/15/2020 Imaging   10/15/2020 CT chest abdomen pelvis redemonstrated postoperative and postradiation appearance of the right chest with perihilar consolidation and fibrosis.  Slight interval decrease in size of the pulmonary nodules associated with interlobular septal thickening nearly  complete resolution of previously seen left-sided pulmonary nodules.  Minimal irregular residual.  Right pleural effusion improved.  Consistent with treatment response.  No evidence of metastatic disease with the abdomen or pelvis.  None obstructive bilateral nephrolithiasis  - partial response to the treatment-  12/15/2020, CT chest without contrast showed resolution of lung nodules, no evidence of  recurrent disease   10/19/2020 -  Chemotherapy   Keytruda monotherapy maintenance   12/30/2020 [facility] Admission   patient was admitted due to unprovoked extensive deep vein thrombosis from calf vein to the common femoral vein on the left.  Patient was started on heparin drip.  Patient underwent thrombolysis and is a colectomy on 12/31/2020.  Anticoagulation was switched to Eliquis at discharge   04/22/2021 Imaging   CT chest with contrast showed none occlusive pulmonary embolism, Interval progression of consolidation airspace opacity in the medial right middle lobe with bandlike opacity and bronchiectasis in the perihilar right lung,-this is compatible with evolving posttreatment changes although recurrent disease anteriorly is not excluded.Interval development of numerous bilateral pulmonary nodules, measuring up to 11 mm in the right lung base.  Concerning for metastatic disease.  Tiny right pleural effusion.    04/26/2021 -  Chemotherapy   resumed on carboplatin/Taxol/Keytruda. Patient did not tolerate his chemotherapy of carboplatin/Taxol/Keytruda on 04/26/2021. Patient has developed severe anemia status post PRBC transfusion. 05/25/2021, carboplatin AUC 5 with Keytruda 06/23/2021  continued on carboplatin AUC 5 with Keytruda 07/07/2021 carboplatin with [name]      07/15/2021 Progression   07/15/2021 CT chest with contrast showed enlarged right lower lobe thick-walled cavitary lesion in the anterior aspect of the right lower lobe.  Concerning for progression.  Multiple other small pulmonary nodules are also noted elsewhere in the lungs, many of which are stable compared to prior studies.  Several are new or clearly enlarged.  The best example is an enlarging pulmonary nodule in the left upper lobe, 8 x 6 mm comparing to 4 x 3 mm on 04/22/2021   08/03/2021 -  Chemotherapy   Patient is on Treatment Plan :  LUNG Docetaxel + Ramucirumab q21d      11/16/2021 Imaging   1. The previously  noted thick-walled cavity of concern in the right lower lobe now appears more simple and thin-walled in appearance, presumably a post infectious cavity. There is a new adjacent thick-walled cavity in the right lower lobe which is aggressive in appearance, but given the evolution of the previous lesion, this may also be of infectious etiology. 2. However, there is an enlarging mixed cystic and solid nodule in the left upper lobe which is concerning for metastatic lesion or new primary lesion. This currently measures 11 x 5 x 10 mm. Close attention on follow-up studies is recommended. 3. No definite signs of metastatic disease to the abdomen or pelvis. 4. Extensive colonic diverticulosis most involved in the sigmoid colon where there appear to be several small diverticular abscesses. one of these appears to fistulized into the wall of the urinary bladder, without frank fistulization to the lumen of the urinary bladder at this time.    02/01/2022 - 02/05/2022 Hospital Admission   Patient was admitted for HCAP, sepsis and COPD exacerbation.  Patient was treated with IV vancomycin, cefepime, prednisone, added doxycycline to cover atypicals given CT findings.  Patient was discharged with Augmentin and doxycycline and completed 7 days of course.   02/01/2022 Imaging   CT angiogram chest with and without contrast 1. Bilateral chronic pulmonary embolus but  no acute pulmonary embolus. 2. Probable pulmonary arterial hypertension. 3.  Aortic Atherosclerosis (ICD10-I70.0).  Coronary atherosclerosis. 4. Improved airspace opacity along the peripheral cavitary process in the right lower lobe, currently the thin walled air cyst or cavity measures about 1.4 cm in diameter. 5. Increased peripheral tree-in-bud reticulonodular opacities in both lower lobes in the lingula, compatible with atypical infection. 6. Airway thickening and airway plugging particularly in the right lower lobe. 7. Mosaic attenuation in the lungs,  stable but nonspecific.8.  Emphysema (ICD10-J43.9). 9. Stable radiation therapy related findings in the right perihilar regions. Right upper lobectomy.   02/10/2022 - 02/12/2022 Hospital Admission   Patient was admitted due to acute hypoxic respiratory failure CT findings highly suspicious for MAC Patient had AFB sputum x 3 and has follow-up appointment with ID to review results.  Patient was discharged on 2 L of nasal cannula oxygen   02/10/2022 Imaging   CT chest without contrast 1. Cavitary lesions of the anterior right lower lobe demonstrate increased wall thickening and there is a new irregular solid pulmonary nodule of the left upper lobe. 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 2. Stable right paramediastinal postradiation change. 3. Similar bilateral mosaic attenuation, findings can be seen in setting of small airways disease or pulmonary hypertension.4. Aortic Atherosclerosis (ICD10-I70.0) and Emphysema (ICD10-J43.9).     ALLERGIES:  has No Known Allergies.  MEDICATIONS:  Current Outpatient Medications  Medication Sig Dispense Refill   albuterol (PROVENTIL) (2.5 MG/3ML) 0.083% nebulizer solution Take 3 mLs (2.5 mg total) by nebulization every 6 (six) hours as needed for wheezing or shortness of breath. 120 mL 5   albuterol (VENTOLIN HFA) 108 (90 Base) MCG/ACT inhaler INHALE 2 PUFFS BY MOUTH EVERY 6 HOURS AS NEEDED FOR WHEEZE OR SHORTNESS OF BREATH 8.5 g 5   apixaban (ELIQUIS) 5 MG TABS tablet Take 1 tablet (5 mg total) by mouth 2 (two) times daily. 60 tablet 2   benzonatate (TESSALON) 100 MG capsule Take 1 capsule (100 mg total) by mouth 3 (three) times daily as needed for cough. 20 capsule 0   budesonide-formoterol (SYMBICORT) 80-4.5 MCG/ACT inhaler Inhale 2 puffs into the lungs 2 (two) times daily. in the morning and at  bedtime. 10.2 each 12   diltiazem (CARDIZEM CD) 120 MG 24 hr capsule Take 120 mg by mouth daily.     feeding supplement (ENSURE ENLIVE / ENSURE PLUS) LIQD Take 237 mLs by mouth 3 (three) times daily between meals. 237 mL 12   HYDROcodone-acetaminophen (NORCO/VICODIN) 5-325 MG tablet Take 1 tablet by mouth every 8 (eight) hours as needed for severe pain. 30 tablet 0   latanoprost (XALATAN) 0.005 % ophthalmic solution Place 1 drop into both eyes at bedtime.     loratadine (CLARITIN) 10 MG tablet Take 10 mg by mouth daily.     methocarbamol (ROBAXIN) 500 MG tablet Take 1 tablet (500 mg total) by mouth at bedtime. 30 tablet 0   metoprolol succinate (TOPROL-XL) 25 MG 24 hr tablet Take 1 tablet (25 mg total) by mouth daily. 30 tablet 0   Multiple Vitamin (MULTIVITAMIN WITH MINERALS) TABS tablet Take 1 tablet by mouth daily.     ondansetron (ZOFRAN) 8 MG tablet Take 1 tablet (8 mg total) by mouth every 8 (eight) hours as needed for refractory nausea / vomiting. Start on day 3 after chemo. 90 tablet 1   pantoprazole (PROTONIX)  20 MG tablet TAKE 1 TABLET(20 MG) BY MOUTH DAILY 60 tablet 2   potassium chloride (KLOR-CON) 10 MEQ tablet Take 10 mEq by mouth daily. Start taking 01/14/22     SPIRIVA HANDIHALER 18 MCG inhalation capsule INHALE 1 CAPSULE VIA HANDIHALER ONCE DAILY AT THE SAME TIME EVERY DAY 30 capsule 0   vitamin B-12 (CYANOCOBALAMIN) 1000 MCG tablet Take 1 tablet (1,000 mcg total) by mouth daily. 90 tablet 1   No current facility-administered medications for this visit.   Facility-Administered Medications Ordered in Other Visits  Medication Dose Route Frequency Provider Last Rate Last Admin   dexamethasone (DECADRON) 10 MG/ML injection            heparin lock flush 100 unit/mL  500 Units Intravenous Once Earlie Server, MD       heparin lock flush 100 unit/mL  500 Units Intravenous Once Earlie Server, MD       sodium chloride flush (NS) 0.9 % injection 10 mL  10 mL Intravenous PRN Earlie Server, MD   10 mL at  04/04/19 0903   sodium chloride flush (NS) 0.9 % injection 10 mL  10 mL Intravenous PRN Earlie Server, MD   10 mL at 07/26/20 1308   sodium chloride flush (NS) 0.9 % injection 10 mL  10 mL Intravenous PRN Earlie Server, MD   10 mL at 07/28/21 5681    VITAL SIGNS: There were no vitals taken for this visit. There were no vitals filed for this visit.  Estimated body mass index is 18.29 kg/m as calculated from the following:   Height as of 03/01/22: _0  (1.854 m).   Weight as of 03/01/22: 138 lb 9.6 oz (62.9 kg).  LABS: CBC:    Component Value Date/Time   WBC 26.5 (H) 02/15/2022 0852   HGB 8.5 (L) 02/15/2022 0852   HGB 15.9 11/02/2014 1859   HCT 24.6 (L) 02/15/2022 0852   HCT 46.0 11/02/2014 1859   PLT 108 (L) 02/15/2022 0852   PLT 143 (L) 11/02/2014 1859   MCV 105.1 (H) 02/15/2022 0852   MCV 100 11/02/2014 1859   NEUTROABS 24.8 (H) 02/15/2022 0852   NEUTROABS 9.0 (H) 07/07/2014 0516   LYMPHSABS 0.3 (L) 02/15/2022 0852   LYMPHSABS 1.5 07/07/2014 0516   MONOABS 1.1 (H) 02/15/2022 0852   MONOABS 0.6 07/07/2014 0516   EOSABS 0.0 02/15/2022 0852   EOSABS 0.3 07/07/2014 0516   BASOSABS 0.1 02/15/2022 0852   BASOSABS 0.1 07/07/2014 0516   Comprehensive Metabolic Panel:    Component Value Date/Time   NA 139 02/15/2022 0852   NA 137 11/02/2014 1859   K 4.0 02/15/2022 0852   K 3.5 11/02/2014 1859   CL 102 02/15/2022 0852   CL 101 11/02/2014 1859   CO2 27 02/15/2022 0852   CO2 26 11/02/2014 1859   BUN 11 02/15/2022 0852   BUN 11 11/02/2014 1859   CREATININE 0.88 02/15/2022 0852   CREATININE 1.06 11/02/2014 1859   GLUCOSE 125 (H) 02/15/2022 0852   GLUCOSE 100 (H) 11/02/2014 1859   CALCIUM 8.5 (L) 02/15/2022 0852   CALCIUM 8.9 11/02/2014 1859   AST 21 02/15/2022 0852   AST 40 11/02/2014 1859   ALT 19 02/15/2022 0852   ALT 23 11/02/2014 1859   ALKPHOS 101 02/15/2022 0852   ALKPHOS 83 11/02/2014 1859   BILITOT 0.4 02/15/2022 0852   BILITOT 1.1 11/02/2014 1859   PROT 6.1 (L)  02/15/2022 0852   PROT 7.8 11/02/2014 1859   ALBUMIN  2.8 (L) 02/15/2022 0852   ALBUMIN 4.0 11/02/2014 1859    RADIOGRAPHIC STUDIES: CT CHEST WO CONTRAST  Result Date: 02/23/2022 CLINICAL DATA:  Squamous cell lung cancer restaging. Prior radiation and chemotherapy. * Tracking Code: BO * EXAM: CT CHEST WITHOUT CONTRAST TECHNIQUE: Multidetector CT imaging of the chest was performed following the standard protocol without IV contrast. RADIATION DOSE REDUCTION: This exam was performed according to the departmental dose-optimization program which includes automated exposure control, adjustment of the mA and/or kV according to patient size and/or use of iterative reconstruction technique. COMPARISON:  02/10/2022 FINDINGS: Cardiovascular: Coronary, aortic arch, and branch vessel atherosclerotic vascular disease. Left Port-A-Cath tip: SVC. Small to moderate pericardial effusion similar to prior. Mediastinum/Nodes: No pathologic adenopathy identified. Lungs/Pleura: Centrilobular emphysema.  Prior right upper lobectomy. Similar bandlike consolidation in the right suprahilar perihilar region characteristic of radiation fibrosis/pneumonitis involving the right middle lobe and right lower lobe. Progressive bilobed cavitary process anteriorly in the right lower lobe measuring 4.9 by 4.3 cm on image 94 series 3, previously the 2 lobulations were more separated and together measured 4.6 by 1.9 cm. Small air-fluid level in an adjacent bulla. Increased scarring at the right lung base with small but increased right pleural effusion. Enhancement noted along the right pleural margin. Bandlike peribronchovascular nodule in the left upper lobe measuring 6 by 4 mm on axial image 49 series 3, previously 4 by 3 mm. Peribronchovascular nodule more caudad in the right upper lobe measuring 1.2 by 0.5 cm on image 59 series 3, formerly 1.1 by 0.5 cm. Upper Abdomen: Unremarkable Musculoskeletal: Unremarkable IMPRESSION: 1. Progressive  airspace opacity along the cavitary process anteriorly in the right lower lobe, probably from cavitary pneumonia given the substantial progression over the last 3-4 weeks. Small but increased right pleural effusion, nonspecific for transudative or exudative etiology. 2. Stable peribronchovascular nodules in the left upper lobe. 3. Other imaging findings of potential clinical significance: Aortic Atherosclerosis (ICD10-I70.0) and Emphysema (ICD10-J43.9). Coronary atherosclerosis. Prior right upper lobectomy. Electronically Signed   By: Van Clines M.D.   On: 02/23/2022 13:40   US Venous Img Lower Bilateral  Result Date: 02/15/2022 CLINICAL DATA:  Bilateral leg swelling. EXAM: BILATERAL LOWER EXTREMITY VENOUS DOPPLER ULTRASOUND TECHNIQUE: Gray-scale sonography with compression, as well as color and duplex ultrasound, were performed to evaluate the deep venous system(s) from the level of the common femoral vein through the popliteal and proximal calf veins. COMPARISON:  None Available. FINDINGS: VENOUS Normal compressibility of the common femoral, superficial femoral, and popliteal veins, as well as the visualized calf veins. Visualized portions of profunda femoral vein and great saphenous vein unremarkable. No filling defects to suggest DVT on grayscale or color Doppler imaging. Doppler waveforms show normal direction of venous flow, normal respiratory plasticity and response to augmentation. Limited views of the contralateral common femoral vein are unremarkable. OTHER None. Limitations: none IMPRESSION: Negative. Electronically Signed   By: Fidela Salisbury M.D.   On: 02/15/2022 15:13   CT CHEST WO CONTRAST  Result Date: 02/10/2022 CLINICAL DATA:  Pneumonia suspected EXAM: CT CHEST WITHOUT CONTRAST TECHNIQUE: Multidetector CT imaging of the chest was performed following the standard protocol without IV contrast. RADIATION DOSE REDUCTION: This exam was performed according to the departmental  dose-optimization program which includes automated exposure control, adjustment of the mA and/or kV according to patient size and/or use of iterative reconstruction technique. COMPARISON:  Chest CT dated February 01, 2022 FINDINGS: Cardiovascular: Mild cardiomegaly. Small pericardial effusion, similar to prior exam. Atherosclerotic disease of the  thoracic aorta. Moderate coronary artery calcifications. Left chest wall port with tip in the mid SVC. Mediastinum/Nodes: Thyroid is unremarkable. Esophagus is unremarkable. No pathologically enlarged lymph nodes seen in the chest. Lungs/Pleura: Moderate centrilobular emphysema. Similar bilateral mosaic attenuation.Right paramediastinal postradiation change, stable when compared with prior exam. Cavitary lesions of the anterior right lower lobe located on series 3, image 77 demonstrate increased wall thickening when compared with prior exam, reference cavitary lesion measuring 19 x 16 mm on series 3, image 77, previously 15 x 13 mm. New irregular solid nodule of the left upper lobe measures 11 x 7 mm on image 49. Previously seen clustered centrilobular nodules of the posterior right lower lobe have resolved. Upper Abdomen: No acute abnormality. Musculoskeletal: No chest wall mass or suspicious bone lesions identified. IMPRESSION: 1. Cavitary lesions of the anterior right lower lobe demonstrate increased wall thickening and there is a new irregular solid pulmonary nodule of the left upper lobe. 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 2. Stable right paramediastinal postradiation change. 3. Similar bilateral mosaic attenuation, findings can be seen in setting of small airways disease or pulmonary hypertension. 4. Aortic Atherosclerosis (ICD10-I70.0) and Emphysema (ICD10-J43.9). Electronically Signed   By: Yetta Glassman M.D.   On: 02/10/2022 15:16   DG Chest 2 View  Result Date: 02/10/2022 CLINICAL DATA:  Shortness of breath EXAM: CHEST - 2 VIEW COMPARISON:  02/01/2022 FINDINGS: Left chest port with catheter tip approximating the SVC. Normal cardiac and mediastinal contours. Again noted is decreased volume in the right lung with unchanged linear densities in the right parahilar region and right lower lung, likely scarring. Blunting of the right costophrenic angle, chronic, likely pleural thickening. Normal left costophrenic angle. Slightly increased opacity in the right mid lung, possibly related to the known cavitary lesions in this region. The left lung is clear. No pneumothorax. IMPRESSION: Slightly increased opacity in the right mid lung, which is nonspecific but may be infectious or related to the cavitary process in this region. Electronically Signed   By: Merilyn Baba M.D.   On: 02/10/2022 11:00    PERFORMANCE STATUS (ECOG) : 2 - Symptomatic, <50% confined to bed  Review of Systems Unless otherwise noted, a complete review of systems is negative.  Physical Exam General: Frail appearing Cardiovascular: regular rate and rhythm Pulmonary: Coarse anterior/posterior fields Abdomen: soft, nontender, + bowel sounds GU: no suprapubic tenderness Extremities: no edema, no joint deformities Skin: no rashes Neurological: Weakness but otherwise nonfocal  IMPRESSION: Patient has had recurrent hospitalizations and is overall quite frail despite his report that he is doing reasonably well.  Discussed with Dr. Tasia Catchings and there really are not any additional treatment options available at this time due to active infection.  Patient is pending consultation by ID later this morning.  Patient's goals are clearly aligned with ongoing treatment.  At baseline, patient lives at home with his wife.  He reports he is functionally independent despite limitations with recent hypoxia requiring oxygen.  He did not wear O2 to  clinic today as he did not have a portable tank and was unable to carry his home tanks.  We will reach out to [facility] to see if we can obtain portable oxygen tanks on his behalf.  Patient reports pain is reasonably well controlled with as needed Norco.  Pain is primarily at night and he finds Norco makes pain  tolerable without any adverse effects.  Patient request refill of Norco.  PDMP reviewed.  Patient does not have advance directives.  He would want his wife to be his decision-maker if needed.  Discussed CODE STATUS and patient stated that he would want an attempt at resuscitation.  In the event of overall decline or such care was felt to be futile, he says he would be willing to rediscuss decisions.  PLAN: -Continue current scope of treatment -ID consult -Refill Norco #30 -Follow-up telephone visit 1 month   Case and plan discussed with Dr. Tasia Catchings  Patient expressed understanding and was in agreement with this plan. He also understands that He can call clinic at any time with any questions, concerns, or complaints.   Thank you for allowing me to participate in the care of this very pleasant patient.   Time Total: 20 minutes  Visit consisted of counseling and education dealing with the complex and emotionally intense issues of symptom management in the setting of serious illness.Greater than 50%  of this time was spent counseling and coordinating care related to the above assessment and plan.  Signed by: Altha Harm, PhD, NP-C

## 2022-03-09 NOTE — Patient Instructions (Signed)
You are here for pseudomonas lung infection- you will need IV antibiotic and we will get back to you to arrange the antibiotics-

## 2022-03-09 NOTE — Progress Notes (Addendum)
NAME: Samuel Choi  DOB: 09-03-1965  MRN: [id number]  Date/Time: 03/09/2022 11:33 AM  Subjective:  Refereed by Dr.Gonzalez for pseudomonas lung infection ? Samuel Choi is a 56 y.o. with a history of SCC rt lung , COPD, PHT, diastolic CHF, DVT/PE was recently [facility] the [facility] for acute hypoxic resp failure and had CT chest and found to have cavitary lesion rt lower lobe 3 sputum for AFB sent and it was negative He then saw Pulmonologist who ordered sputum bacterial culture and it grew 2 types of pseudomonas both resistant to cipro' and is referred to me for management Pt has SCC of rt lung and  underwent on 08/13/18 rt thoracotomy and wedge resection of rt upper lobe mass.  And then on 08/20/18 underwent complete lobectomy. Observation following that as it was T1b, NO, MO. Marland Kitchen He then had recurrence June 2020 and started chemo 01/17/19-03/07/19 along with concurrent radiation Dec 2021 progression 07/16/20-09/28/20 took carboplatin , taxol and keytruda partial response In Jan 2023 started docaetaxel and ramucirumab and they were held because of the cavitary lesion noted in may 2023  Pt has no fever Has cough with white sputum Some sob Weight loss Lives with his wife Ex smoker  Past Medical History:  Diagnosis Date   Alcohol abuse    usually drinks 2-3 drinks per day   Atherosclerosis 06/2018   Chronic sinusitis    Dehydration 02/07/2019   Emphysema of lung ([place]) 06/2018   patient unaware of this.   Hip fracture ([place]) 05/2018   no surgery   History of kidney stones 05/2018   per xray, bilateral nephrolitiasis   Hypertension    Squamous cell carcinoma of lung, right ([place]) 06/2018    Past Surgical History:  Procedure Laterality Date   BRAIN SURGERY  10/2017   nasal/sinus endoscopy. mass benign   ELECTROMAGNETIC NAVIGATION BROCHOSCOPY Right 06/25/2018   Procedure: ELECTROMAGNETIC NAVIGATION BRONCHOSCOPY;  Surgeon: [name], MD;  Location: [facility];  Service: Cardiopulmonary;   Laterality: Right;   ENDOBRONCHIAL ULTRASOUND Right 06/25/2018   Procedure: ENDOBRONCHIAL ULTRASOUND;  Surgeon: [name], MD;  Location: [facility];  Service: Cardiopulmonary;  Laterality: Right;   ENDOBRONCHIAL ULTRASOUND Right 12/26/2018   Procedure: ENDOBRONCHIAL ULTRASOUND RIGHT;  Surgeon: [name], MD;  Location: [facility];  Service: Cardiopulmonary;  Laterality: Right;   FLEXIBLE BRONCHOSCOPY N/A 08/20/2018   Procedure: FLEXIBLE BRONCHOSCOPY PREOP;  Surgeon: [name], MD;  Location: [facility];  Service: Thoracic;  Laterality: N/A;   IR CV LINE INJECTION  04/04/2019   NASAL SINUS SURGERY  10/2017   At [facility], frontal sinusotomy, ethmoidectomy, resection anterior cranial fossa neoplasm, turbinate resection   PERIPHERAL VASCULAR THROMBECTOMY Left 12/31/2020   Procedure: PERIPHERAL VASCULAR THROMBECTOMY;  Surgeon: Algernon Huxley, MD;  Location: [facility];  Service: Cardiovascular;  Laterality: Left;   PORTACATH PLACEMENT Left 01/15/2019   Procedure: INSERTION PORT-A-CATH;  Surgeon: [name], MD;  Location: [facility];  Service: General;  Laterality: Left;   THORACOTOMY Right 08/13/2018   Procedure: PREOP BROCHOSCOPY WITH RIGHT THORACOTOMY AND RUL RESECTION;  Surgeon: [name], MD;  Location: [facility];  Service: General;  Laterality: Right;   THORACOTOMY Right 08/20/2018   Procedure: THORACOTOMY MAJOR RIGHT UPPER LOBE LOBECTOMY;  Surgeon: [name], MD;  Location: [facility];  Service: Thoracic;  Laterality: Right;   TOE SURGERY Left    pin in left toe    Social History   Socioeconomic History   Marital status: Married    Spouse  name: lisa   Number of children: Not on file   Years of education: Not on file   Highest education level: Not on file  Occupational History   Occupation: welding    Comment: taking time off to resolve issues  Tobacco Use   Smoking status: Former    Packs/day: 0.50    Years: 15.00    Total pack years: 7.50    Types: Cigarettes    Quit  date: 08/2018    Years since quitting: 3.5   Smokeless tobacco: Never  Vaping Use   Vaping Use: Never used  Substance and Sexual Activity   Alcohol use: Yes    Alcohol/week: 3.0 standard drinks of alcohol    Types: 3 Cans of beer per week    Comment: usually 2 drinks per week, per patient   Drug use: No   Sexual activity: Not on file  Other Topics Concern   Not on file  Social History Narrative   Not on file   Social Determinants of Health   Financial Resource Strain: Not on file  Food Insecurity: Not on file  Transportation Needs: Not on file  Physical Activity: Not on file  Stress: Not on file  Social Connections: Not on file  Intimate Partner Violence: Not on file    Family History  Problem Relation Age of Onset   Breast cancer Mother    Diabetes Mother    Lung cancer Father    Hypertension Father    No Known Allergies I? Current Outpatient Medications  Medication Sig Dispense Refill   albuterol (PROVENTIL) (2.5 MG/3ML) 0.083% nebulizer solution Take 3 mLs (2.5 mg total) by nebulization every 6 (six) hours as needed for wheezing or shortness of breath. 120 mL 5   albuterol (VENTOLIN HFA) 108 (90 Base) MCG/ACT inhaler INHALE 2 PUFFS BY MOUTH EVERY 6 HOURS AS NEEDED FOR WHEEZE OR SHORTNESS OF BREATH 8.5 g 5   apixaban (ELIQUIS) 5 MG TABS tablet Take 1 tablet (5 mg total) by mouth 2 (two) times daily. 60 tablet 2   benzonatate (TESSALON) 100 MG capsule Take 1 capsule (100 mg total) by mouth 3 (three) times daily as needed for cough. 20 capsule 2   budesonide-formoterol (SYMBICORT) 80-4.5 MCG/ACT inhaler Inhale 2 puffs into the lungs 2 (two) times daily. in the morning and at bedtime. 10.2 each 12   diltiazem (CARDIZEM CD) 120 MG 24 hr capsule Take 120 mg by mouth daily.     feeding supplement (ENSURE ENLIVE / ENSURE PLUS) LIQD Take 237 mLs by mouth 3 (three) times daily between meals. 237 mL 12   HYDROcodone-acetaminophen (NORCO/VICODIN) 5-325 MG tablet Take 1 tablet by  mouth every 8 (eight) hours as needed for severe pain. 30 tablet 0   latanoprost (XALATAN) 0.005 % ophthalmic solution Place 1 drop into both eyes at bedtime.     loratadine (CLARITIN) 10 MG tablet Take 10 mg by mouth daily.     methocarbamol (ROBAXIN) 500 MG tablet Take 1 tablet (500 mg total) by mouth at bedtime. 30 tablet 0   metoprolol succinate (TOPROL-XL) 25 MG 24 hr tablet Take 1 tablet (25 mg total) by mouth daily. 30 tablet 0   Multiple Vitamin (MULTIVITAMIN WITH MINERALS) TABS tablet Take 1 tablet by mouth daily.     ondansetron (ZOFRAN) 8 MG tablet Take 1 tablet (8 mg total) by mouth every 8 (eight) hours as needed for refractory nausea / vomiting. Start on day 3 after chemo. 90 tablet 1  pantoprazole (PROTONIX) 20 MG tablet TAKE 1 TABLET(20 MG) BY MOUTH DAILY 60 tablet 2   SPIRIVA HANDIHALER 18 MCG inhalation capsule INHALE 1 CAPSULE VIA HANDIHALER ONCE DAILY AT THE SAME TIME EVERY DAY 30 capsule 0   vitamin B-12 (CYANOCOBALAMIN) 1000 MCG tablet Take 1 tablet (1,000 mcg total) by mouth daily. 90 tablet 1   No current facility-administered medications for this visit.   Facility-Administered Medications Ordered in Other Visits  Medication Dose Route Frequency Provider Last Rate Last Admin   dexamethasone (DECADRON) 10 MG/ML injection            heparin lock flush 100 unit/mL  500 Units Intravenous Once Earlie Server, MD       heparin lock flush 100 unit/mL  500 Units Intravenous Once Earlie Server, MD       sodium chloride flush (NS) 0.9 % injection 10 mL  10 mL Intravenous PRN Earlie Server, MD   10 mL at 04/04/19 4503   sodium chloride flush (NS) 0.9 % injection 10 mL  10 mL Intravenous PRN Earlie Server, MD   10 mL at 07/26/20 1308   sodium chloride flush (NS) 0.9 % injection 10 mL  10 mL Intravenous PRN Earlie Server, MD   10 mL at 07/28/21 8882     Abtx:  Anti-infectives (From admission, onward)    None       REVIEW OF SYSTEMS:  Const: negative fever, negative chills, ++ weight loss Eyes:  negative diplopia or visual changes, negative eye pain ENT: negative coryza, negative sore throat Resp:  cough, , dyspnea Cards: rt sided  chest pain, no palpitations, lower extremity edema GU: negative for frequency, dysuria and hematuria GI: Negative for abdominal pain, diarrhea, bleeding, constipation Skin: negative for rash and pruritus Heme: negative for easy bruising and gum/nose bleeding MS: general weakness Neurolo:negative for headaches, dizziness, vertigo, memory problems  Psych: negative for feelings of anxiety, depression  Endocrine: negative for thyroid, diabetes Allergy/Immunology- negative for any medication or food allergies ?  Objective:  VITALS:  BP (!) 143/96   Pulse (!) 112   Temp 98.1 F (36.7 C) (Temporal)  LDA port PHYSICAL EXAM:  General: Alert, cooperative, no distress, appears stated age.  Head: Normocephalic, without obvious abnormality, atraumatic. Eyes: strabismus ENT Nares normal. No drainage or sinus tenderness. Lips, mucosa, and tongue normal. No Thrush Neck: e, symmetrical, no adenopathy, thyroid: non tender no carotid bruit and no JVD. Lungs: b/l air netry Rhonchi rt side Heart: s1s2 Abdomen: did not examine as in wheel chair Extremities:edema rt ankle Skin: No rashes or lesions. Or bruising Lymph: Cervical, supraclavicular normal. Neurologic: Grossly non-focal Pertinent Labs    Latest Ref Rng & Units 02/15/2022    8:52 AM 02/12/2022    5:00 AM 02/11/2022   11:55 AM  CBC  WBC 4.0 - 10.5 K/uL 26.5  37.0  29.4   Hemoglobin 13.0 - 17.0 g/dL 8.5  8.3  8.8   Hematocrit 39.0 - 52.0 % 24.6  24.1  26.5   Platelets 150 - 400 K/uL 108  77  79        Latest Ref Rng & Units 02/15/2022    8:52 AM 02/11/2022   11:55 AM 02/10/2022   10:35 AM  CMP  Glucose 70 - 99 mg/dL 125  117  151   BUN 6 - 20 mg/dL _0 Creatinine 0.61 - 1.24 mg/dL 0.88  0.90  1.05   Sodium 135 - 145 mmol/L 139  139  139   Potassium 3.5 - 5.1 mmol/L 4.0  3.5  3.5    Chloride 98 - 111 mmol/L 102  106  106   CO2 22 - 32 mmol/L _0 Calcium 8.9 - 10.3 mg/dL 8.5  8.4  7.9   Total Protein 6.5 - 8.1 g/dL 6.1     Total Bilirubin 0.3 - 1.2 mg/dL 0.4     Alkaline Phos 38 - 126 U/L 101     AST 15 - 41 U/L 21     ALT 0 - 44 U/L 19         Microbiology: 8/5 2 sputum for AFB- neg smear 02/12/22 sputum for Afb- neg smear Culture pending Recent Results (from the past 240 hour(s))  Acid Fast Smear (AFB)     Status: None   Collection Time: 03/03/22  1:00 PM   Specimen: Sputum  Result Value Ref Range Status   AFB Specimen Processing Concentration  Final   Acid Fast Smear Negative  Final    Comment: (NOTE) Performed At: [facility] [address] [place], [place] [id number] Rush Farmer MD JM:[id number]    Source (AFB) EXPECTORATED SPUTUM  Final    Comment: Performed at [facility], [address] [place], [place] [address]  Expectorated Sputum Assessment w Gram Stain, Rflx to Resp Cult     Status: None   Collection Time: 03/03/22  1:00 PM   Specimen: Expectorated Sputum  Result Value Ref Range Status   Specimen Description EXPECTORATED SPUTUM  Final   Special Requests NONE  Final   Sputum evaluation   Final    THIS SPECIMEN IS ACCEPTABLE FOR SPUTUM CULTURE Performed at [facility], [address], [place], [place] [address]    Report Status 03/03/2022 FINAL  Final  Culture, Respiratory w Gram Stain     Status: None   Collection Time: 03/03/22  1:00 PM  Result Value Ref Range Status   Specimen Description   Final    EXPECTORATED SPUTUM Performed at [facility], [address], [place], [place] [address]    Special Requests   Final    NONE Reflexed from [id number] Performed at [facility], [address] [place], [place] [address]    Gram Stain   Final    ABUNDANT SQUAMOUS EPITHELIAL CELLS PRESENT ABUNDANT WBC PRESENT, PREDOMINANTLY PMN ABUNDANT GRAM NEGATIVE RODS ABUNDANT GRAM  POSITIVE COCCI IN CLUSTERS IN CHAINS    Culture   Final    MODERATE PSEUDOMONAS AERUGINOSA GRAM NEGATIVE RODS SAME ID WITH DIFFERENT COLONY MORPHOLOGY. Performed at [facility], [place] [address], [place], [place] [address]    Report Status 03/06/2022 FINAL  Final   Organism ID, Bacteria PSEUDOMONAS AERUGINOSA  Final   Organism ID, Bacteria GRAM NEGATIVE RODS  Final      Susceptibility   Pseudomonas aeruginosa - MIC*    CEFTAZIDIME 4 SENSITIVE Sensitive     CIPROFLOXACIN 2 RESISTANT Resistant     GENTAMICIN <=1 SENSITIVE Sensitive     IMIPENEM 2 SENSITIVE Sensitive     * MODERATE PSEUDOMONAS AERUGINOSA   Gram negative rods - MIC*    CEFTAZIDIME 16 INTERMEDIATE Intermediate     CIPROFLOXACIN 1 INTERMEDIATE Intermediate     GENTAMICIN <=1 SENSITIVE Sensitive     IMIPENEM 1 SENSITIVE Sensitive     * GRAM NEGATIVE RODS    IMAGING RESULTS: Progressive airspace opacity along with cavitary process anteriorly rt lower lobe.  Stable peribronchovascular nodules left upper lobe I have personally reviewed the films ? Impression/Recommendation ?56 yr male with SCC of rt lung,s/p surgery, chemo radiation, then recurrence, restarted chemo , COPD, now has a cavitary infiltrate rt lower lobe  Rt lowerlobe cavitary infiltrate- The cavity likely is a result of radiation/copd and now has secondary infection with  pseudomonas in culture. Afb X 4 smear negative There are 2 types of pseudomonas colonies and they both are resistant o ciprofloxacin- Do not see data on piptazo or cefepime- called lab and they will send it out to [facility] for further workup. 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 _complicated medical problem- __________________________________________________ Discussed with patient in detail.  Note:  This document was prepared using Dragon voice recognition software and may include unintentional dictation errors.   Addendum  [facility] Orders Diagnosis: Pseudomonas necrotizing pneumonia with cavity  Baseline Creatinine  <1    No Known Allergies   Meropenem 1 gram IV every 8hours  Duration: 3 weeks End Date: 04/04/22  PORT  Care Per Protocol:  Labs weekly while on IV antibiotics: _X_ CBC with differential  _X_ CMP    Fax weekly lab results  promptly to [phone]  Clinic Follow Up Appt: 03/28/22 at 10.15 am   Call [phone] with any questions

## 2022-03-09 NOTE — Telephone Encounter (Signed)
Order placed. Pt/pt's family aware.

## 2022-03-10 ENCOUNTER — Telehealth: Payer: Self-pay

## 2022-03-10 NOTE — Telephone Encounter (Signed)
Dr. Delaine Lame would like to start patient on IV meropenem it will be 1 gram every 8 hrs for 3 weeks. I have reached out to [facility] through [facility] to give orders for the patient.  Per Carolynn Sayers she will work on getting this done in the home for the patient by Tues. [facility] has also been advised that the patient already has a port.  I have also spoke to the patient and advised him that he should hear from [facility] by Tuesday.  Nazim Kadlec T [facility]ors

## 2022-03-11 LAB — CULTURE, RESPIRATORY W GRAM STAIN

## 2022-03-11 NOTE — Addendum Note (Signed)
Addended by: Earlie Server on: 03/11/2022 08:09 PM   Modules accepted: Orders

## 2022-03-14 ENCOUNTER — Telehealth: Payer: Self-pay | Admitting: *Deleted

## 2022-03-14 ENCOUNTER — Other Ambulatory Visit: Payer: Self-pay | Admitting: Infectious Diseases

## 2022-03-14 ENCOUNTER — Telehealth: Payer: Self-pay | Admitting: Pulmonary Disease

## 2022-03-14 ENCOUNTER — Ambulatory Visit
Admission: RE | Admit: 2022-03-14 | Discharge: 2022-03-14 | Disposition: A | Discharge status: Home / Self Care | Payer: 59 | Source: Ambulatory Visit | Attending: Infectious Diseases | Admitting: Infectious Diseases

## 2022-03-14 DIAGNOSIS — R7881 Bacteremia: Secondary | ICD-10-CM | POA: Insufficient documentation

## 2022-03-14 MED ORDER — SODIUM CHLORIDE 0.9 % IV SOLN: 1.0000 g | Freq: Once | INTRAVENOUS | 0 refills | Status: AC

## 2022-03-14 MED ORDER — HEPARIN SOD (PORK) LOCK FLUSH 100 UNIT/ML IV SOLN
INTRAVENOUS | Status: AC
  Filled 2022-03-14: qty 5

## 2022-03-14 MED ORDER — HEPARIN SOD (PORK) LOCK FLUSH 100 UNIT/ML IV SOLN
500.0000 [IU] | Freq: Once | INTRAVENOUS | Status: AC
  Administered 2022-03-14: 500 [IU] via INTRAVENOUS

## 2022-03-14 MED ORDER — SODIUM CHLORIDE 0.9% FLUSH
10.0000 mL | Freq: Once | INTRAVENOUS | Status: AC
  Administered 2022-03-14: 10 mL via INTRAVENOUS

## 2022-03-14 MED ORDER — MEROPENEM 1 G IV SOLR
1.0000 g | Freq: Once | INTRAVENOUS | Status: AC
  Administered 2022-03-14: 1 g via INTRAVENOUS
  Filled 2022-03-14: qty 20

## 2022-03-14 NOTE — Telephone Encounter (Signed)
Spoke to patient.  He stated that he was told that a home health nurse would be out at his home this morning for IV abx, however he was not given a time.  It appears that Dr. Ramon Dredge ordered this. I recommended that he contact Dr. Sandford Craze office. I have provided him with contact number.  Nothing further needed.

## 2022-03-14 NOTE — Telephone Encounter (Signed)
Patient called stating that he still has not heard anything about getting small O2 pack and would like [place] to check on this for him please

## 2022-03-14 NOTE — Progress Notes (Signed)
Put an order for Meropenem 1 gram one dose

## 2022-03-14 NOTE — Telephone Encounter (Signed)
Spoke with Zac Blank at [facility] again today. I had previously spoke to [place] last week and Merrily Pew has entered an order for oxygen tank reevaluation to get a small tank for pt. Zac will send a msg out to his team. He is uncertain of why the patient has not heard back from [facility] but will f/u with the patient. I called pt to let him know that we reached out to [facility] and someone will be calling him to service his oxygen needs. He asked me if I could reach out to [facility] as he has not heard from the nursing team coming out to his home for IV antibiotics. There is a note in the computer that Dr. Delaine Lame had ordered home antibiotics and contacted Carolynn Sayers to get this for patient. I contacted Lavonia Drafts at [facility]. He will send a msg to Pam to f/u on this with the patient.

## 2022-03-15 ENCOUNTER — Telehealth: Payer: Self-pay | Admitting: *Deleted

## 2022-03-15 ENCOUNTER — Telehealth: Payer: Self-pay | Admitting: Pulmonary Disease

## 2022-03-15 DIAGNOSIS — J449 Chronic obstructive pulmonary disease, unspecified: Secondary | ICD-10-CM

## 2022-03-15 NOTE — Telephone Encounter (Signed)
Order for nebulizer has been placed. Patient is aware and voiced his understanding.  Nothing further needed.

## 2022-03-15 NOTE — Telephone Encounter (Signed)
Per Samuel Choi- pt has called twice this morning- to request an update on his oxygen portable tanks. I have spoken to Zac twice about the patient's concerns- as previously documented. I reached out to Zac at [facility] again today. I was informed that patient is on the "list" to call. Hopefully, pt will be reached out to some time today and no later than tomorrow for an oxygen tank needs reevaluation.  Samuel Choi- please let patient know this process can take some time to coordinate- per Zac at [facility].

## 2022-03-15 NOTE — Telephone Encounter (Signed)
I spoke with patient and tried to explain to him that we have called [facility] rep Zac twice and that they have his number and will call him and it may take time to get to him. He was very unhappy and stated "Am I supposed to become a statistic?" He said that he wants progress on this matter. I told him no, but that we do not have control over someone else's business. He sarcastically said OK have a great day and hung up on me

## 2022-03-19 ENCOUNTER — Other Ambulatory Visit: Payer: Self-pay | Admitting: Oncology

## 2022-03-20 ENCOUNTER — Encounter: Payer: Self-pay | Admitting: Oncology

## 2022-03-28 ENCOUNTER — Encounter: Payer: Self-pay | Admitting: Infectious Diseases

## 2022-03-28 ENCOUNTER — Ambulatory Visit: Payer: 59 | Attending: Infectious Diseases | Admitting: Infectious Diseases

## 2022-03-28 VITALS — BP 155/88 | HR 102 | Temp 98.1°F | Ht 73.0 in | Wt 133.0 lb

## 2022-03-28 DIAGNOSIS — D539 Nutritional anemia, unspecified: Secondary | ICD-10-CM | POA: Insufficient documentation

## 2022-03-28 DIAGNOSIS — Z923 Personal history of irradiation: Secondary | ICD-10-CM | POA: Diagnosis not present

## 2022-03-28 DIAGNOSIS — J984 Other disorders of lung: Secondary | ICD-10-CM

## 2022-03-28 DIAGNOSIS — E46 Unspecified protein-calorie malnutrition: Secondary | ICD-10-CM | POA: Insufficient documentation

## 2022-03-28 DIAGNOSIS — Z86718 Personal history of other venous thrombosis and embolism: Secondary | ICD-10-CM | POA: Insufficient documentation

## 2022-03-28 DIAGNOSIS — C3491 Malignant neoplasm of unspecified part of right bronchus or lung: Secondary | ICD-10-CM | POA: Diagnosis present

## 2022-03-28 DIAGNOSIS — R918 Other nonspecific abnormal finding of lung field: Secondary | ICD-10-CM | POA: Diagnosis not present

## 2022-03-28 DIAGNOSIS — I110 Hypertensive heart disease with heart failure: Secondary | ICD-10-CM | POA: Insufficient documentation

## 2022-03-28 DIAGNOSIS — Z5111 Encounter for antineoplastic chemotherapy: Secondary | ICD-10-CM | POA: Insufficient documentation

## 2022-03-28 DIAGNOSIS — J189 Pneumonia, unspecified organism: Secondary | ICD-10-CM | POA: Diagnosis not present

## 2022-03-28 DIAGNOSIS — J449 Chronic obstructive pulmonary disease, unspecified: Secondary | ICD-10-CM | POA: Insufficient documentation

## 2022-03-28 DIAGNOSIS — Z681 Body mass index (BMI) 19 or less, adult: Secondary | ICD-10-CM | POA: Diagnosis not present

## 2022-03-28 NOTE — Progress Notes (Signed)
NAME: Samuel Choi  DOB: [birth year]  MRN: [id number]  Date/Time: 03/28/2022 11:03 AM  Subjective:  Follow up visit for pseudomonas cavitary pneumonia rt lung- here with his wife On Iv meropenem - for the past 2 weeks Doing much better Says more energy Cough better Eating better 100% adherent to IV Will complete 3 weeks on 04/04/22   Following taken from notes before ? ANTHONIE LOTITO is a 56 y.o. with a history of SCC rt lung , COPD, PHT, diastolic CHF, DVT/PE was recently in the [facility]ital for acute hypoxic resp failure and had CT chest and found to have cavitary lesion rt lower lobe 3 sputum for AFB sent and it was negative He then saw Pulmonologist who ordered sputum bacterial culture and it grew 2 types of pseudomonas both resistant to cipro' and is referred to me for management Pt has SCC of rt lung and  underwent on 08/13/18 rt thoracotomy and wedge resection of rt upper lobe mass.  And then on 08/20/18 underwent complete lobectomy. Observation following that as it was T1b, NO, MO. Marland Kitchen He then had recurrence June 2020 and started chemo 01/17/19-03/07/19 along with concurrent radiation Dec 2021 progression 07/16/20-09/28/20 took carboplatin , taxol and keytruda partial response In Jan 2023 started docaetaxel and ramucirumab and they were held because of the cavitary lesion noted in may 2023  Past Medical History:  Diagnosis Date   Alcohol abuse    usually drinks 2-3 drinks per day   Atherosclerosis 06/2018   Chronic sinusitis    Dehydration 02/07/2019   Emphysema of lung ([place]) 06/2018   patient unaware of this.   Hip fracture ([place]) 05/2018   no surgery   History of kidney stones 05/2018   per xray, bilateral nephrolitiasis   Hypertension    Squamous cell carcinoma of lung, right ([place]) 06/2018    Past Surgical History:  Procedure Laterality Date   BRAIN SURGERY  10/2017   nasal/sinus endoscopy. mass benign   ELECTROMAGNETIC NAVIGATION BROCHOSCOPY Right 06/25/2018   Procedure:  ELECTROMAGNETIC NAVIGATION BRONCHOSCOPY;  Surgeon: [name], MD;  Location: [facility];  Service: Cardiopulmonary;  Laterality: Right;   ENDOBRONCHIAL ULTRASOUND Right 06/25/2018   Procedure: ENDOBRONCHIAL ULTRASOUND;  Surgeon: [name], MD;  Location: [facility];  Service: Cardiopulmonary;  Laterality: Right;   ENDOBRONCHIAL ULTRASOUND Right 12/26/2018   Procedure: ENDOBRONCHIAL ULTRASOUND RIGHT;  Surgeon: [name], MD;  Location: [facility];  Service: Cardiopulmonary;  Laterality: Right;   FLEXIBLE BRONCHOSCOPY N/A 08/20/2018   Procedure: FLEXIBLE BRONCHOSCOPY PREOP;  Surgeon: [name], MD;  Location: [facility];  Service: Thoracic;  Laterality: N/A;   IR CV LINE INJECTION  04/04/2019   NASAL SINUS SURGERY  10/2017   At [facility], frontal sinusotomy, ethmoidectomy, resection anterior cranial fossa neoplasm, turbinate resection   PERIPHERAL VASCULAR THROMBECTOMY Left 12/31/2020   Procedure: PERIPHERAL VASCULAR THROMBECTOMY;  Surgeon: Algernon Huxley, MD;  Location: [facility];  Service: Cardiovascular;  Laterality: Left;   PORTACATH PLACEMENT Left 01/15/2019   Procedure: INSERTION PORT-A-CATH;  Surgeon: [name], MD;  Location: [facility];  Service: General;  Laterality: Left;   THORACOTOMY Right 08/13/2018   Procedure: PREOP BROCHOSCOPY WITH RIGHT THORACOTOMY AND RUL RESECTION;  Surgeon: [name], MD;  Location: [facility];  Service: General;  Laterality: Right;   THORACOTOMY Right 08/20/2018   Procedure: THORACOTOMY MAJOR RIGHT UPPER LOBE LOBECTOMY;  Surgeon: [name], MD;  Location: [facility];  Service: Thoracic;  Laterality: Right;   TOE SURGERY Left  pin in left toe    Social History   Socioeconomic History   Marital status: Married    Spouse name: lisa   Number of children: Not on file   Years of education: Not on file   Highest education level: Not on file  Occupational History   Occupation: welding    Comment: taking time off to resolve issues  Tobacco Use    Smoking status: Former    Packs/day: 0.50    Years: 15.00    Total pack years: 7.50    Types: Cigarettes    Quit date: 08/2018    Years since quitting: 3.6   Smokeless tobacco: Never  Vaping Use   Vaping Use: Never used  Substance and Sexual Activity   Alcohol use: Yes    Alcohol/week: 3.0 standard drinks of alcohol    Types: 3 Cans of beer per week    Comment: usually 2 drinks per week, per patient   Drug use: No   Sexual activity: Not on file  Other Topics Concern   Not on file  Social History Narrative   Not on file   Social Determinants of Health   Financial Resource Strain: Not on file  Food Insecurity: Not on file  Transportation Needs: Not on file  Physical Activity: Not on file  Stress: Not on file  Social Connections: Not on file  Intimate Partner Violence: Not on file    Family History  Problem Relation Age of Onset   Breast cancer Mother    Diabetes Mother    Lung cancer Father    Hypertension Father    No Known Allergies I? Current Outpatient Medications  Medication Sig Dispense Refill   albuterol (PROVENTIL) (2.5 MG/3ML) 0.083% nebulizer solution Take 3 mLs (2.5 mg total) by nebulization every 6 (six) hours as needed for wheezing or shortness of breath. 120 mL 5   albuterol (VENTOLIN HFA) 108 (90 Base) MCG/ACT inhaler INHALE 2 PUFFS BY MOUTH EVERY 6 HOURS AS NEEDED FOR WHEEZING OR SHORTNESS OF BREATH 8.5 g 5   apixaban (ELIQUIS) 5 MG TABS tablet Take 1 tablet (5 mg total) by mouth 2 (two) times daily. 60 tablet 2   benzonatate (TESSALON) 100 MG capsule Take 1 capsule (100 mg total) by mouth 3 (three) times daily as needed for cough. 20 capsule 2   budesonide-formoterol (SYMBICORT) 80-4.5 MCG/ACT inhaler Inhale 2 puffs into the lungs 2 (two) times daily. in the morning and at bedtime. 10.2 each 12   diltiazem (CARDIZEM CD) 120 MG 24 hr capsule Take 120 mg by mouth daily.     feeding supplement (ENSURE ENLIVE / ENSURE PLUS) LIQD Take 237 mLs by mouth 3  (three) times daily between meals. 237 mL 12   HYDROcodone-acetaminophen (NORCO/VICODIN) 5-325 MG tablet Take 1 tablet by mouth every 8 (eight) hours as needed for severe pain. 30 tablet 0   latanoprost (XALATAN) 0.005 % ophthalmic solution Place 1 drop into both eyes at bedtime.     loratadine (CLARITIN) 10 MG tablet Take 10 mg by mouth daily.     methocarbamol (ROBAXIN) 500 MG tablet Take 1 tablet (500 mg total) by mouth at bedtime. 30 tablet 0   metoprolol succinate (TOPROL-XL) 25 MG 24 hr tablet Take 1 tablet (25 mg total) by mouth daily. 30 tablet 0   Multiple Vitamin (MULTIVITAMIN WITH MINERALS) TABS tablet Take 1 tablet by mouth daily.     ondansetron (ZOFRAN) 8 MG tablet Take 1 tablet (8 mg total) by mouth  every 8 (eight) hours as needed for refractory nausea / vomiting. Start on day 3 after chemo. 90 tablet 1   pantoprazole (PROTONIX) 20 MG tablet TAKE 1 TABLET(20 MG) BY MOUTH DAILY 60 tablet 2   SPIRIVA HANDIHALER 18 MCG inhalation capsule INHALE 1 CAPSULE VIA HANDIHALER ONCE DAILY AT THE SAME TIME EVERY DAY 30 capsule 0   vitamin B-12 (CYANOCOBALAMIN) 1000 MCG tablet Take 1 tablet (1,000 mcg total) by mouth daily. 90 tablet 1   No current facility-administered medications for this visit.   Facility-Administered Medications Ordered in Other Visits  Medication Dose Route Frequency Provider Last Rate Last Admin   dexamethasone (DECADRON) 10 MG/ML injection            heparin lock flush 100 unit/mL  500 Units Intravenous Once Earlie Server, MD       heparin lock flush 100 unit/mL  500 Units Intravenous Once Earlie Server, MD       sodium chloride flush (NS) 0.9 % injection 10 mL  10 mL Intravenous PRN Earlie Server, MD   10 mL at 04/04/19 8466   sodium chloride flush (NS) 0.9 % injection 10 mL  10 mL Intravenous PRN Earlie Server, MD   10 mL at 07/26/20 1308   sodium chloride flush (NS) 0.9 % injection 10 mL  10 mL Intravenous PRN Earlie Server, MD   10 mL at 07/28/21 5993     Abtx:  Anti-infectives (From  admission, onward)    None       REVIEW OF SYSTEMS:  Const: negative fever, negative chills, ++ weight loss Eyes: negative diplopia or visual changes, negative eye pain ENT: negative coryza, negative sore throat Resp:  cough, , dyspnea Cards: rt sided  chest pain, no palpitations, lower extremity edema GU: negative for frequency, dysuria and hematuria GI: Negative for abdominal pain, diarrhea, bleeding, constipation Skin: negative for rash and pruritus Heme: negative for easy bruising and gum/nose bleeding MS: general weakness Neurolo:negative for headaches, dizziness, vertigo, memory problems  Psych: negative for feelings of anxiety, depression  Endocrine: negative for thyroid, diabetes Allergy/Immunology- negative for any medication or food allergies ?  Objective:  VITALS:  BP (!) 155/88   Pulse (!) 102   Temp 98.1 F (36.7 C) (Temporal)   Ht _0  (1.854 m)   Wt 133 lb (60.3 kg)   BMI 17.55 kg/m  LDA port PHYSICAL EXAM:  General: Alert, cooperative, no distress, appears stated age.  Head: Normocephalic, without obvious abnormality, atraumatic. Eyes: strabismus ENT Nares normal. No drainage or sinus tenderness. Lips, mucosa, and tongue normal. No Thrush Neck: e, symmetrical, no adenopathy, thyroid: non tender no carotid bruit and no JVD. Lungs: b/l air netry Rhonchi rt side Heart: s1s2 Abdomen: did not examine as in wheel chair Extremities:edema rt ankle Skin: No rashes or lesions. Or bruising Lymph: Cervical, supraclavicular normal. Neurologic: Grossly non-focal Pertinent Labs    Latest Ref Rng & Units 02/15/2022    8:52 AM 02/12/2022    5:00 AM 02/11/2022   11:55 AM  CBC  WBC 4.0 - 10.5 K/uL 26.5  37.0  29.4   Hemoglobin 13.0 - 17.0 g/dL 8.5  8.3  8.8   Hematocrit 39.0 - 52.0 % 24.6  24.1  26.5   Platelets 150 - 400 K/uL 108  77  79        Latest Ref Rng & Units 02/15/2022    8:52 AM 02/11/2022   11:55 AM 02/10/2022   10:35 AM  CMP  Glucose  70 - 99 mg/dL  125  117  151   BUN 6 - 20 mg/dL _0 Creatinine 0.61 - 1.24 mg/dL 0.88  0.90  1.05   Sodium 135 - 145 mmol/L 139  139  139   Potassium 3.5 - 5.1 mmol/L 4.0  3.5  3.5   Chloride 98 - 111 mmol/L 102  106  106   CO2 22 - 32 mmol/L _1 Calcium 8.9 - 10.3 mg/dL 8.5  8.4  7.9   Total Protein 6.5 - 8.1 g/dL 6.1     Total Bilirubin 0.3 - 1.2 mg/dL 0.4     Alkaline Phos 38 - 126 U/L 101     AST 15 - 41 U/L 21     ALT 0 - 44 U/L 19         Microbiology: 8/5 2 sputum for AFB- neg smear 02/12/22 sputum for Afb- neg smear Culture pending No results found for this or any previous visit (from the past 240 hour(s)).   IMAGING RESULTS: Progressive airspace opacity along with cavitary process anteriorly rt lower lobe. Stable peribronchovascular nodules left upper lobe I have personally reviewed the films ? Impression/Recommendation ?56 yr male with SCC of rt lung,s/p surgery, chemo radiation, then recurrence, restarted chemo , COPD, now has a cavitary infiltrate rt lower lobe  Rt lowerlobe cavitary infiltrate- The cavity likely is a result of radiation/copd and now has secondary infection with  pseudomonas in culture. Afb X 4 smear negative There are 2 types of pseudomonas colonies and they both are resistant o ciprofloxacin- pt on Iv meropenem and will complete 3 weeks on 04/04/22- will be discontinued after that- HE has PORT . 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

## 2022-03-28 NOTE — Patient Instructions (Signed)
You are here for follow up of the rt lowe rlobe pneumonia- you are on meropenem And completed nearly 2 weeks- you have another week Will get a CXR

## 2022-03-29 LAB — MTB-RIF NAA WITH AFB CULTURE, SPUTUM
Acid Fast Culture: NEGATIVE
Acid Fast Culture: NEGATIVE

## 2022-04-04 ENCOUNTER — Encounter: Payer: Self-pay | Admitting: Pulmonary Disease

## 2022-04-04 ENCOUNTER — Ambulatory Visit (INDEPENDENT_AMBULATORY_CARE_PROVIDER_SITE_OTHER): Payer: 59 | Admitting: Pulmonary Disease

## 2022-04-04 VITALS — BP 124/78 | HR 110 | Temp 98.6°F | Ht 73.0 in | Wt 134.0 lb

## 2022-04-04 DIAGNOSIS — E43 Unspecified severe protein-calorie malnutrition: Secondary | ICD-10-CM | POA: Diagnosis not present

## 2022-04-04 DIAGNOSIS — J988 Other specified respiratory disorders: Secondary | ICD-10-CM

## 2022-04-04 DIAGNOSIS — J449 Chronic obstructive pulmonary disease, unspecified: Secondary | ICD-10-CM

## 2022-04-04 DIAGNOSIS — B965 Pseudomonas (aeruginosa) (mallei) (pseudomallei) as the cause of diseases classified elsewhere: Secondary | ICD-10-CM

## 2022-04-04 DIAGNOSIS — J850 Gangrene and necrosis of lung: Secondary | ICD-10-CM | POA: Diagnosis not present

## 2022-04-04 DIAGNOSIS — C3491 Malignant neoplasm of unspecified part of right bronchus or lung: Secondary | ICD-10-CM

## 2022-04-04 NOTE — Patient Instructions (Signed)
We are going to schedule a CT scan to be done within the next week.  Continue taking your Symbicort (inhaler)  We will see you in follow-up in 6 to 8 weeks time call sooner should any new problems arise.

## 2022-04-04 NOTE — Progress Notes (Signed)
Subjective:    Patient ID: Samuel Choi, male    DOB: [birth year], 56 y.o.   MRN: [id number] Patient Care Team: Earlie Server, MD as PCP - General (Oncology) Earlie Server, MD as Consulting Physician (Hematology and Oncology) Tsosie Billing, MD as Consulting Physician (Infectious Diseases) Borders, Kirt Boys, NP as Nurse Practitioner (Hospice and Palliative Medicine)  Chief Complaint  Patient presents with   Follow-up    HPI Patient is a 56 year old former smoker, with a history as noted below, who presents for follow-up on the issue of abnormal imaging of the lung in the setting of squamous cell carcinoma of the right lung.  He was noted to have some cavitary lesions in the lung on the right.  He had been worked up while during a hospitalization but only sputum for MTB probe was obtained.  We subsequently saw him on 23 August and at that time he was still producing quite a bit of sputum.  His cavitary lesions on the right lower lobe appear to have progressed significantly and rapidly.  This was consistent with an infectious process.  Microbiology of the sputum collected on 25 August showed that the patient had 2 different strains of Pseudomonas that were quite resistant.  He was referred to the infectious disease clinic and Dr. Delaine Lame saw the patient and started him on IV meropenem.  He is to complete meropenem tomorrow.  The patient states that since he started antibiotics he has felt he has more stamina.  He actually presents fully ambulatory today where this previously he presented on a transport chair due to being too weak to ambulate.  Cough has been rare and nonproductive.  No hemoptysis.  No shortness of breath.  He states he is compliant with Symbicort.  He has not had issues with diarrhea (recall that he had an issue with C. difficile previously).  No orthopnea or paroxysmal nocturnal dyspnea.  No lower extremity edema and no calf tenderness.  He notes his appetite and food intake  improving.  Overall he feels well, he appears chronically ill but more interactive and fully ambulatory today.   Review of Systems A 10 point review of systems was performed and it is as noted above otherwise negative.  Patient Active Problem List   Diagnosis Date Noted   Cavitary lesion of lung 02/11/2022   Acute respiratory failure ([place]) 02/10/2022   Lactic acidosis 02/10/2022   SVT (supraventricular tachycardia) ([place]) 02/02/2022   Sepsis ([place]) 02/01/2022   Hyponatremia 02/01/2022   Chronic diastolic CHF (congestive heart failure) ([place]) 02/01/2022   Macrocytic anemia 02/01/2022   Elevated serum creatinine 01/25/2022   Diverticulosis of colon 11/18/2021   History of thrombosis 11/18/2021   Weight loss 11/09/2021   C. difficile colitis 10/19/2021   Hyperkalemia 07/28/2021   Severe protein-calorie malnutrition ([place]) 06/08/2021   Antineoplastic chemotherapy induced anemia 06/08/2021   Pulmonary embolus ([place]) 06/08/2021   Chemotherapy induced nausea and vomiting 05/17/2021   Symptomatic anemia 05/17/2021   Wide-complex tachycardia    Chemotherapy induced neutropenia ([place]) 11/09/2020   COPD (chronic obstructive pulmonary disease) ([place]) 04/22/2020   Sinus tachycardia 12/18/2019   Hypomagnesemia 12/10/2019   Encounter for antineoplastic immunotherapy 11/24/2019   Ex-smoker 07/23/2019   Port-A-Cath in place 04/04/2019   Thrombocytopenia ([place]) 04/04/2019   Folate deficiency 04/04/2019   Hypokalemia 04/04/2019   B12 deficiency 04/04/2019   Alcohol abuse 04/04/2019   Dehydration 02/07/2019   Encounter for antineoplastic chemotherapy 01/31/2019   Squamous cell carcinoma of right lung ([place])  09/03/2018   Goals of care, counseling/discussion 09/03/2018   Malnutrition of moderate degree 08/23/2018   Emphysema of lung ([place]) 06/2018   Allergic rhinitis 06/2018   H/O nasal polyp 02/20/2018   Acute sinusitis 10/10/2017   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/10/2017   Chronic rhinitis 10/10/2017    Mass of sinus 10/10/2017   Essential hypertension 09/27/2017   Tobacco abuse 09/27/2017   Social History   Tobacco Use   Smoking status: Former    Packs/day: 0.50    Years: 15.00    Total pack years: 7.50    Types: Cigarettes    Quit date: 08/2018    Years since quitting: 3.6   Smokeless tobacco: Never  Substance Use Topics   Alcohol use: Yes    Alcohol/week: 3.0 standard drinks of alcohol    Types: 3 Cans of beer per week    Comment: usually 2 drinks per week, per patient   No Known Allergies  Current Meds  Medication Sig   albuterol (PROVENTIL) (2.5 MG/3ML) 0.083% nebulizer solution Take 3 mLs (2.5 mg total) by nebulization every 6 (six) hours as needed for wheezing or shortness of breath.   albuterol (VENTOLIN HFA) 108 (90 Base) MCG/ACT inhaler INHALE 2 PUFFS BY MOUTH EVERY 6 HOURS AS NEEDED FOR WHEEZING OR SHORTNESS OF BREATH   apixaban (ELIQUIS) 5 MG TABS tablet Take 1 tablet (5 mg total) by mouth 2 (two) times daily.   benzonatate (TESSALON) 100 MG capsule Take 1 capsule (100 mg total) by mouth 3 (three) times daily as needed for cough.   budesonide-formoterol (SYMBICORT) 80-4.5 MCG/ACT inhaler Inhale 2 puffs into the lungs 2 (two) times daily. in the morning and at bedtime.   diltiazem (CARDIZEM CD) 120 MG 24 hr capsule Take 120 mg by mouth daily.   feeding supplement (ENSURE ENLIVE / ENSURE PLUS) LIQD Take 237 mLs by mouth 3 (three) times daily between meals.   HYDROcodone-acetaminophen (NORCO/VICODIN) 5-325 MG tablet Take 1 tablet by mouth every 8 (eight) hours as needed for severe pain.   latanoprost (XALATAN) 0.005 % ophthalmic solution Place 1 drop into both eyes at bedtime.   loratadine (CLARITIN) 10 MG tablet Take 10 mg by mouth daily.   methocarbamol (ROBAXIN) 500 MG tablet Take 1 tablet (500 mg total) by mouth at bedtime.   metoprolol succinate (TOPROL-XL) 25 MG 24 hr tablet Take 1 tablet (25 mg total) by mouth daily.   Multiple Vitamin (MULTIVITAMIN WITH  MINERALS) TABS tablet Take 1 tablet by mouth daily.   ondansetron (ZOFRAN) 8 MG tablet Take 1 tablet (8 mg total) by mouth every 8 (eight) hours as needed for refractory nausea / vomiting. Start on day 3 after chemo.   pantoprazole (PROTONIX) 20 MG tablet TAKE 1 TABLET(20 MG) BY MOUTH DAILY   SPIRIVA HANDIHALER 18 MCG inhalation capsule INHALE 1 CAPSULE VIA HANDIHALER ONCE DAILY AT THE SAME TIME EVERY DAY   vitamin B-12 (CYANOCOBALAMIN) 1000 MCG tablet Take 1 tablet (1,000 mcg total) by mouth daily.   Immunization History  Administered Date(s) Administered   Influenza Inj Mdck Quad Pf 03/28/2019   Influenza, High Dose Seasonal PF 03/28/2019   Moderna Sars-Covid-2 Vaccination 11/14/2019   Pneumococcal Conjugate-13 02/17/2021   Pneumococcal Polysaccharide-23 11/19/2018   Tdap 12/31/2017   Zoster Recombinat (Shingrix) 03/28/2019, 05/01/2019, 06/09/2019      Objective:   Physical Exam BP 124/78 (BP Location: Left Arm, Cuff Size: Normal)   Pulse (!) 110   Temp 98.6 F (37 C)   Ht 6\' 1"  (  1.854 m)   Wt 134 lb (60.8 kg)   SpO2 100%   BMI 17.68 kg/m  GENERAL: Thin, frail-appearing gentleman, presents fully ambulatory.  No acute distress. HEAD: Normocephalic, atraumatic.  Alopecia due to chemotherapy. EYES: Pupils equal, round, reactive to light.  No scleral icterus.  Occasional disconjugate gaze. MOUTH: Poor dentition, oral mucosa moist.  No thrush. NECK: Supple. No thyromegaly. Trachea midline. No JVD.  No adenopathy. PULMONARY: Good air entry bilaterally.  No adventitious sounds. CARDIOVASCULAR: S1 and S2.  Tachycardic with regular rhythm.  No rubs, murmurs or gallops heard. ABDOMEN: Scaphoid, otherwise benign. MUSCULOSKELETAL: No joint deformity, no clubbing, no edema.  Significant sarcopenia. NEUROLOGIC: Fully ambulatory today, occasional disconjugate gaze.  Otherwise no focality. SKIN: Intact,warm,dry. PSYCH: Flat affect.    Assessment & Plan:     ICD-10-CM   1. Necrotizing  pneumonia ([place])  J85.0 CT CHEST WO CONTRAST   Is due to Pseudomonas Completing meropenem therapy    2. Pseudomonas respiratory infection  J98.8    B96.5    On meropenem as above    3. COPD mixed type (H[facility])  J44.9    Continue Symbicort 160/4.5, 2 puffs twice a day Continue as needed albuterol    4. Severe protein-calorie malnutrition ([place])  E43    Appetite and food intake improving    5. Squamous cell carcinoma of right lung (H[facility]) Stage IV a  C34.91    This issue adds complexity to his management Follows with oncology     Orders Placed This Encounter  Procedures   CT CHEST WO CONTRAST    Standing Status:   Future    Standing Expiration Date:   04/05/2023    Scheduling Instructions:     Within 1 week.    Order Specific Question:   Preferred imaging location?    Answer:   [facility]   Patient will complete his meropenem tomorrow.  We will check a CT chest within the week.  We will see him in follow-up in 6 to 8 weeks time he is to contact us prior to that time should any new difficulties arise.  Renold Don, MD Advanced Bronchoscopy PCCM [facility]    *This note was dictated using voice recognition software/Dragon.  Despite best efforts to proofread, errors can occur which can change the meaning. Any transcriptional errors that result from this process are unintentional and may not be fully corrected at the time of dictation.

## 2022-04-06 ENCOUNTER — Telehealth: Payer: Self-pay

## 2022-04-06 NOTE — Telephone Encounter (Signed)
Samuel Hatchet RN with [facility] requesting order to deaccess patient's port. Verbal orders given to Carolynn Sayers Per Dr. Delaine Lame ok to de access port after completion of IV antibiotics.   Samuel Choi

## 2022-04-07 ENCOUNTER — Inpatient Hospital Stay: Payer: 59 | Attending: Oncology | Admitting: Hospice and Palliative Medicine

## 2022-04-07 ENCOUNTER — Encounter: Payer: Self-pay | Admitting: Oncology

## 2022-04-07 ENCOUNTER — Inpatient Hospital Stay (HOSPITAL_BASED_OUTPATIENT_CLINIC_OR_DEPARTMENT_OTHER): Payer: 59 | Admitting: Oncology

## 2022-04-07 DIAGNOSIS — Z86711 Personal history of pulmonary embolism: Secondary | ICD-10-CM | POA: Diagnosis present

## 2022-04-07 DIAGNOSIS — E43 Unspecified severe protein-calorie malnutrition: Secondary | ICD-10-CM | POA: Insufficient documentation

## 2022-04-07 DIAGNOSIS — Z515 Encounter for palliative care: Secondary | ICD-10-CM | POA: Diagnosis not present

## 2022-04-07 DIAGNOSIS — C3411 Malignant neoplasm of upper lobe, right bronchus or lung: Secondary | ICD-10-CM | POA: Diagnosis present

## 2022-04-07 DIAGNOSIS — B449 Aspergillosis, unspecified: Secondary | ICD-10-CM | POA: Insufficient documentation

## 2022-04-07 DIAGNOSIS — Z7901 Long term (current) use of anticoagulants: Secondary | ICD-10-CM | POA: Diagnosis not present

## 2022-04-07 DIAGNOSIS — Z86718 Personal history of other venous thrombosis and embolism: Secondary | ICD-10-CM | POA: Insufficient documentation

## 2022-04-07 DIAGNOSIS — Z902 Acquired absence of lung [part of]: Secondary | ICD-10-CM | POA: Insufficient documentation

## 2022-04-07 DIAGNOSIS — Z79899 Other long term (current) drug therapy: Secondary | ICD-10-CM | POA: Diagnosis not present

## 2022-04-07 DIAGNOSIS — C3491 Malignant neoplasm of unspecified part of right bronchus or lung: Secondary | ICD-10-CM

## 2022-04-07 DIAGNOSIS — J850 Gangrene and necrosis of lung: Secondary | ICD-10-CM

## 2022-04-07 NOTE — Assessment & Plan Note (Signed)
07/16/2020-09/28/2020 carboplatin Taxol and Keytruda x 1  partial response -Keytruda maintenance.-04/22/2021, progression-resumed on carboplatin/Taxol/Keytruda - did not tolerate due to myelosuppression.-->  Taxol held, carboplatin/Keytruda--> CT 07/2020 progression --> 08/03/2021 started docetaxel and ramucirumab.--> Labs are reviewed and discussed with patient. Hold Docetaxel and Ramucirumab.   Repeat CT scan

## 2022-04-07 NOTE — Progress Notes (Signed)
Virtual Visit via Telephone Note  I connected with Samuel Choi on 04/07/22 at  2:00 PM EDT by telephone and verified that I am speaking with the correct person using two identifiers.  Location: Patient: Home Provider: Clinic   I discussed the limitations, risks, security and privacy concerns of performing an evaluation and management service by telephone and the availability of in person appointments. I also discussed with the patient that there Samuel be a patient responsible charge related to this service. The patient expressed understanding and agreed to proceed.   History of Present Illness: Samuel Choi is a 56 y.o. 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  He was initially diagnosed with stage I in 2020 and underwent lobectomy but had disease recurrence.  He is post Taxol Keytruda, patient had progression and poor tolerance due to myelosuppression.  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 Most recently, patient has been on docetaxel.    Observations/Objective: Follow-up call.  Patient reports that he is doing better.  He completed IV antibiotics today.  Patient saw Dr. Tasia Catchings earlier today.  Patient reports that his goals are still aligned with ongoing treatment if any options are available.  Symptomatically, he feels like he is doing well.  Denies any acute or distressing symptoms at present.  Assessment and Plan: Stage IV lung cancer -patient being followed by Dr. Tasia Catchings.  Treatment currently on hold due to necrotizing pneumonia.  Patient is completing course of meropenem.  Symptomatically appears to be doing well.  Follow Up Instructions: Follow-up telephone visit 2 months   I discussed the assessment and treatment plan with the patient. The patient was provided an opportunity to ask questions and all were answered. The patient  agreed with the plan and demonstrated an understanding of the instructions.   The patient was advised to call back or seek an in-person evaluation if the symptoms worsen or if the condition fails to improve as anticipated.  I provided 10 minutes of non-face-to-face time during this encounter.   Irean Hong, NP

## 2022-04-07 NOTE — Assessment & Plan Note (Signed)
Patient previously refuses nutrition supplements. Encourage patient to take limitation.   Encourage patient to follow-up with nutritionist

## 2022-04-07 NOTE — Progress Notes (Signed)
Hematology/Oncology Progress note [phone] [phone]     Clinic Day:  04/07/2022   Referring physician: Earlie Server, MD ASSESSMENT & PLAN:   Cancer Staging  Squamous cell carcinoma of right lung [facility]) Staging form: Lung, AJCC 8th Edition - Clinical stage from 07/08/2020: Stage IVA (rcT4, cM1a) - Signed by Earlie Server, MD on 07/08/2020   Squamous cell carcinoma of right lung ([place]) 07/16/2020-09/28/2020 carboplatin Taxol and Keytruda x 1  partial response -Keytruda maintenance.-04/22/2021, progression-resumed on carboplatin/Taxol/Keytruda - did not tolerate due to myelosuppression.-->  Taxol held, carboplatin/Keytruda--> CT 07/2020 progression --> 08/03/2021 started docetaxel and ramucirumab.--> Labs are reviewed and discussed with patient. Hold Docetaxel and Ramucirumab.   Repeat CT scan  Severe protein-calorie malnutrition [facility]) Patient previously refuses nutrition supplements. Encourage patient to take limitation.   Encourage patient to follow-up with nutritionist  Necrotic pneumonia [facility]) Patient has been treated with antibiotics.  He has upcoming repeat CT scan in October 2023.  Recommend patient to continue follow-up with pulmonology  No orders of the defined types were placed in this encounter.  Follow-up TBD.  Depending on CT findings. All questions were answered. The patient knows to call the clinic with any problems, questions or concerns.  Earlie Server, MD, PhD [facility] Hematology Oncology 04/07/2022    CHIEF COMPLAINT:  CC: Follow up for lung cancer treatments.   Current Treatment:  Docetaxel Ramucizumab  HISTORY OF PRESENT ILLNESS:   Oncology History  Squamous cell carcinoma of right lung ([place])  08/13/2018 Surgery   s/p Bronchoscopy on 06/25/2018. subcarina EBUS FNA was non diagnostic, hypocellular specimen.  08/13/18 Patient underwent right thoracotomy and wedge resection of a right upper lobe mass.  Frozen section was consistent with an  inflammatory process.  On the second postop day, preliminary pathology reports high-grade malignancy.  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  08/20/2018 Patient was therefore taken back to the OR and underwent take complete lobectomy  pT1b pN0 cM0 stage I squamous cell lung cancer. Margin is negative.  Recommend observation.     08/13/2018 Initial Diagnosis   Stage I Squamous cell carcinoma of lung, right ([place])   12/03/2018 Progression   12/03/2018 CT chest showed abnormal soft tissue in the right suprahilar region measuring up to 2.5 cm, worrisome for recurrence 02/13/5642 PET Hypermetabolic RIGHT suprahilar mass consistent with lung cancer, recurrence. 12/26/2018 s/p bronchoscopy biopsy. Confirmed local recurrence of squamous cell lung cancer.  medi port placed by Dr.Oaks. 01/20/19 MRI brain w/wo contrast: No evidence of metastatic disease   01/17/2019 - 03/07/2019 Chemotherapy   concurrent chemoradiation on 01/16/2019 Carboplatin AUC of 2 and Taxol 45 mg/m2 weekly finished in 03/05/2019    03/31/2019 Imaging   03/31/19 CT chest showed Right suprahilar lesion has decreased slightly in size in the interval since prior PET-CT.  No new or progressive findings on today's study.   04/10/2019 - 05/20/2020 Chemotherapy   04/10/2019, patient started on durvalumab Q 2 weeks maintenance.    06/14/2020 Progression   06/14/2020 CT chest w contrast showed vidence of progressive lymphangitic spread of tumor throughout the right lung, with contralateral lung nodules with  right pleural effusion  07/01/22 MRI brain is negative for CNS involvement.  CT abdomen with contrast showed no evidence of abdominal metastatic disease PET scan was not approved by insurance-peer to peer appeal with Dr.Vipul Bhanderi    07/08/2020 Cancer Staging   Staging form: Lung, AJCC 8th Edition - Clinical stage from 07/08/2020: Stage IVA (rcT4, cM1a) - Signed by Earlie Server,  MD on 07/08/2020   07/16/2020 - 09/28/2021 Chemotherapy    Carboplatin + Paclitaxel + Pembrolizumab q21d x 4 cycles      10/15/2020 Imaging   10/15/2020 CT chest abdomen pelvis redemonstrated postoperative and postradiation appearance of the right chest with perihilar consolidation and fibrosis.  Slight interval decrease in size of the pulmonary nodules associated with interlobular septal thickening nearly complete resolution of previously seen left-sided pulmonary nodules.  Minimal irregular residual.  Right pleural effusion improved.  Consistent with treatment response.  No evidence of metastatic disease with the abdomen or pelvis.  None obstructive bilateral nephrolithiasis  - partial response to the treatment-  12/15/2020, CT chest without contrast showed resolution of lung nodules, no evidence of recurrent disease   10/19/2020 -  Chemotherapy   Keytruda monotherapy maintenance   12/30/2020 [facility] Admission   patient was admitted due to unprovoked extensive deep vein thrombosis from calf vein to the common femoral vein on the left.  Patient was started on heparin drip.  Patient underwent thrombolysis and is a colectomy on 12/31/2020.  Anticoagulation was switched to Eliquis at discharge   04/22/2021 Imaging   CT chest with contrast showed none occlusive pulmonary embolism, Interval progression of consolidation airspace opacity in the medial right middle lobe with bandlike opacity and bronchiectasis in the perihilar right lung,-this is compatible with evolving posttreatment changes although recurrent disease anteriorly is not excluded.Interval development of numerous bilateral pulmonary nodules, measuring up to 11 mm in the right lung base.  Concerning for metastatic disease.  Tiny right pleural effusion.    04/26/2021 -  Chemotherapy   resumed on carboplatin/Taxol/Keytruda. Patient did not tolerate his chemotherapy of carboplatin/Taxol/Keytruda on 04/26/2021. Patient has developed severe anemia status post PRBC transfusion. 05/25/2021, carboplatin AUC  5 with Keytruda 06/23/2021  continued on carboplatin AUC 5 with Keytruda 07/07/2021 carboplatin with [name]      07/15/2021 Progression   07/15/2021 CT chest with contrast showed enlarged right lower lobe thick-walled cavitary lesion in the anterior aspect of the right lower lobe.  Concerning for progression.  Multiple other small pulmonary nodules are also noted elsewhere in the lungs, many of which are stable compared to prior studies.  Several are new or clearly enlarged.  The best example is an enlarging pulmonary nodule in the left upper lobe, 8 x 6 mm comparing to 4 x 3 mm on 04/22/2021   08/03/2021 - 02/14/2022 Chemotherapy   LUNG Docetaxel + Ramucirumab q21d      11/16/2021 Imaging   1. The previously noted thick-walled cavity of concern in the right lower lobe now appears more simple and thin-walled in appearance, presumably a post infectious cavity. There is a new adjacent thick-walled cavity in the right lower lobe which is aggressive in appearance, but given the evolution of the previous lesion, this may also be of infectious etiology. 2. However, there is an enlarging mixed cystic and solid nodule in the left upper lobe which is concerning for metastatic lesion or new primary lesion. This currently measures 11 x 5 x 10 mm. Close attention on follow-up studies is recommended. 3. No definite signs of metastatic disease to the abdomen or pelvis. 4. Extensive colonic diverticulosis most involved in the sigmoid colon where there appear to be several small diverticular abscesses. one of these appears to fistulized into the wall of the urinary bladder, without frank fistulization to the lumen of the urinary bladder at this time.    02/01/2022 - 02/05/2022 Hospital Admission   Patient was  admitted for HCAP, sepsis and COPD exacerbation.  Patient was treated with IV vancomycin, cefepime, prednisone, added doxycycline to cover atypicals given CT findings.  Patient was discharged with Augmentin and  doxycycline and completed 7 days of course.   02/01/2022 Imaging   CT angiogram chest with and without contrast 1. Bilateral chronic pulmonary embolus but no acute pulmonary embolus. 2. Probable pulmonary arterial hypertension. 3.  Aortic Atherosclerosis (ICD10-I70.0).  Coronary atherosclerosis. 4. Improved airspace opacity along the peripheral cavitary process in the right lower lobe, currently the thin walled air cyst or cavity measures about 1.4 cm in diameter. 5. Increased peripheral tree-in-bud reticulonodular opacities in both lower lobes in the lingula, compatible with atypical infection. 6. Airway thickening and airway plugging particularly in the right lower lobe. 7. Mosaic attenuation in the lungs, stable but nonspecific.8.  Emphysema (ICD10-J43.9). 9. Stable radiation therapy related findings in the right perihilar regions. Right upper lobectomy.   02/10/2022 - 02/12/2022 Hospital Admission   Patient was admitted due to acute hypoxic respiratory failure CT findings highly suspicious for MAC Patient had AFB sputum x 3 and has follow-up appointment with ID to review results.  Patient was discharged on 2 L of nasal cannula oxygen   02/10/2022 Imaging   CT chest without contrast 1. Cavitary lesions of the anterior right lower lobe demonstrate increased wall thickening and there is a new irregular solid pulmonary nodule of the left upper lobe. 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 2. Stable right paramediastinal postradiation change. 3. Similar bilateral mosaic attenuation, findings can be seen in setting of small airways disease or pulmonary hypertension.4. Aortic Atherosclerosis (ICD10-I70.0) and Emphysema (ICD10-J43.9).     INTERVAL HISTORY Samuel Choi is a 56 y.o. male who has above history reviewed by me today presents for  follow up visit for metastatic squamous cell carcinoma of lung. 03/06/2022, sputum collected shows Pseudomonas  aeruginosa which explains his findings on the CT scan.  There were 2 types of pseudomonas colonies and they both are resistant o ciprofloxacin. patient was referred to infectious disease and was seen by Dr. Steva Ready on 03/09/2022.  Patient was treated with meropenem and completed his course on 04/04/2022.  Chemotherapy has been on hold due to necrotic pneumonia due to Pseudomonas. Patient reports feeling well now.  He is on oxygen at home.  Denies shortness of breath however pulse oximetry on room oxygen was in the 80s.  He was put on 2 L of oxygen in our office and oxygen level improved. He has gained weight, about 5 pounds since his last visit.   REVIEW OF SYSTEMS:  Review of Systems  Constitutional:  Positive for fatigue. Negative for appetite change, chills, fever and unexpected weight change.  HENT:   Negative for hearing loss and voice change.   Eyes:  Negative for eye problems and icterus.  Respiratory:  Positive for shortness of breath. Negative for chest tightness and cough.   Cardiovascular:  Negative for chest pain and leg swelling.  Gastrointestinal:  Negative for abdominal distention, abdominal pain and blood in stool.  Endocrine: Negative for hot flashes.  Genitourinary:  Negative for difficulty urinating, dysuria and frequency.   Musculoskeletal:  Negative for arthralgias.  Skin:  Negative for itching and rash.  Neurological:  Negative for extremity weakness, light-headedness and numbness.  Hematological:  Negative for adenopathy. Does not bruise/bleed easily.  Psychiatric/Behavioral:  Negative for  confusion.       VITALS:  Blood pressure 113/67, pulse (!) 106, temperature (!) 96.6 F (35.9 C), resp. rate 16, weight 134 lb 8 oz (61 kg), SpO2 (!) 82 %.  Wt Readings from Last 3 Encounters:  04/07/22 134 lb 8 oz (61 kg)  04/04/22 134 lb (60.8 kg)  03/28/22 133 lb  (60.3 kg)    Body mass index is 17.75 kg/m.  Performance status (ECOG): 1 - Symptomatic but completely ambulatory  PHYSICAL EXAM:  Physical Exam HENT:     Head: Normocephalic and atraumatic.  Eyes:     General: No scleral icterus. Cardiovascular:     Rate and Rhythm: Normal rate.  Pulmonary:     Effort: Pulmonary effort is normal.     Breath sounds: No wheezing or rales.     Comments: Decreased breath sound bilaterally Patient is on nasal cannula oxygen. Abdominal:     General: There is no distension.     Palpations: Abdomen is soft.     Tenderness: There is no abdominal tenderness.  Musculoskeletal:        General: No swelling.     Cervical back: Normal range of motion.  Skin:    General: Skin is warm and dry.  Neurological:     General: No focal deficit present.     Mental Status: He is alert. Mental status is at baseline.  Psychiatric:        Mood and Affect: Mood normal.     LABS:      Latest Ref Rng & Units 02/15/2022    8:52 AM 02/12/2022    5:00 AM 02/11/2022   11:55 AM  CBC  WBC 4.0 - 10.5 K/uL 26.5  37.0  29.4   Hemoglobin 13.0 - 17.0 g/dL 8.5  8.3  8.8   Hematocrit 39.0 - 52.0 % 24.6  24.1  26.5   Platelets 150 - 400 K/uL 108  77  79       Latest Ref Rng & Units 02/15/2022    8:52 AM 02/11/2022   11:55 AM 02/10/2022   10:35 AM  CMP  Glucose 70 - 99 mg/dL 125  117  151   BUN 6 - 20 mg/dL _0 Creatinine 0.61 - 1.24 mg/dL 0.88  0.90  1.05   Sodium 135 - 145 mmol/L 139  139  139   Potassium 3.5 - 5.1 mmol/L 4.0  3.5  3.5   Chloride 98 - 111 mmol/L 102  106  106   CO2 22 - 32 mmol/L _1 Calcium 8.9 - 10.3 mg/dL 8.5  8.4  7.9   Total Protein 6.5 - 8.1 g/dL 6.1     Total Bilirubin 0.3 - 1.2 mg/dL 0.4     Alkaline Phos 38 - 126 U/L 101     AST 15 - 41 U/L 21     ALT 0 - 44 U/L 19        HISTORY:   Past Medical History:  Diagnosis Date   Alcohol abuse    usually drinks 2-3 drinks per day   Atherosclerosis 06/2018   Chronic  sinusitis    Dehydration 02/07/2019   Emphysema of lung ([place]) 06/2018   patient unaware of this.   Hip fracture ([place]) 05/2018   no surgery   History of kidney stones 05/2018   per xray, bilateral nephrolitiasis   Hypertension    Squamous cell carcinoma of lung, right (  [place]) 06/2018    Past Surgical History:  Procedure Laterality Date   BRAIN SURGERY  10/2017   nasal/sinus endoscopy. mass benign   ELECTROMAGNETIC NAVIGATION BROCHOSCOPY Right 06/25/2018   Procedure: ELECTROMAGNETIC NAVIGATION BRONCHOSCOPY;  Surgeon: [name], MD;  Location: [facility] ORS;  Service: Cardiopulmonary;  Laterality: Right;   ENDOBRONCHIAL ULTRASOUND Right 06/25/2018   Procedure: ENDOBRONCHIAL ULTRASOUND;  Surgeon: [name], MD;  Location: [facility];  Service: Cardiopulmonary;  Laterality: Right;   ENDOBRONCHIAL ULTRASOUND Right 12/26/2018   Procedure: ENDOBRONCHIAL ULTRASOUND RIGHT;  Surgeon: [name], MD;  Location: [facility] OR[facility];  Service: Cardiopulmonary;  Laterality: Right;   FLEXIBLE BRONCHOSCOPY N/A 08/20/2018   Procedure: FLEXIBLE BRONCHOSCOPY PREOP;  Surgeon: [name], MD;  Location: [facility] ORS;  Service: Thoracic;  Laterality: N/A;   IR CV LINE INJECTION  04/04/2019   NASAL SINUS SURGERY  10/2017   At [facility], frontal sinusotomy, ethmoidectomy, resection anterior cranial fossa neoplasm, turbinate resection   PERIPHERAL VASCULAR THROMBECTOMY Left 12/31/2020   Procedure: PERIPHERAL VASCULAR THROMBECTOMY;  Surgeon: Algernon Huxley, MD;  Location: [place] [facility];  Service: Cardiovascular;  Laterality: Left;   PORTACATH PLACEMENT Left 01/15/2019   Procedure: INSERTION PORT-A-CATH;  Surgeon: [name], MD;  Location: [facility];  Service: General;  Laterality: Left;   THORACOTOMY Right 08/13/2018   Procedure: PREOP BROCHOSCOPY WITH RIGHT THORACOTOMY AND RUL RESECTION;  Surgeon: [name], MD;  Location: [facility];  Service: General;  Laterality: Right;   THORACOTOMY Right 08/20/2018   Procedure:  THORACOTOMY MAJOR RIGHT UPPER LOBE LOBECTOMY;  Surgeon: [name], MD;  Location: [facility];  Service: Thoracic;  Laterality: Right;   TOE SURGERY Left    pin in left toe    Family History  Problem Relation Age of Onset   Breast cancer Mother    Diabetes Mother    Lung cancer Father    Hypertension Father     Social History:  reports that he quit smoking about 3 years ago. His smoking use included cigarettes. He has a 7.50 pack-year smoking history. He has never used smokeless tobacco. He reports current alcohol use of about 3.0 standard drinks of alcohol per week. He reports that he does not use drugs.  Allergies: No Known Allergies  Current Medications: Current Outpatient Medications  Medication Sig Dispense Refill   albuterol (PROVENTIL) (2.5 MG/3ML) 0.083% nebulizer solution Take 3 mLs (2.5 mg total) by nebulization every 6 (six) hours as needed for wheezing or shortness of breath. 120 mL 5   albuterol (VENTOLIN HFA) 108 (90 Base) MCG/ACT inhaler INHALE 2 PUFFS BY MOUTH EVERY 6 HOURS AS NEEDED FOR WHEEZING OR SHORTNESS OF BREATH 8.5 g 5   apixaban (ELIQUIS) 5 MG TABS tablet Take 1 tablet (5 mg total) by mouth 2 (two) times daily. 60 tablet 2   benzonatate (TESSALON) 100 MG capsule Take 1 capsule (100 mg total) by mouth 3 (three) times daily as needed for cough. 20 capsule 2   budesonide-formoterol (SYMBICORT) 80-4.5 MCG/ACT inhaler Inhale 2 puffs into the lungs 2 (two) times daily. in the morning and at bedtime. 10.2 each 12   diltiazem (CARDIZEM CD) 120 MG 24 hr capsule Take 120 mg by mouth daily.     feeding supplement (ENSURE ENLIVE / ENSURE PLUS) LIQD Take 237 mLs by mouth 3 (three) times daily between meals. 237 mL 12   HYDROcodone-acetaminophen (NORCO/VICODIN) 5-325 MG tablet Take 1 tablet by mouth every 8 (eight) hours as needed for severe pain. 30 tablet 0   latanoprost (  XALATAN) 0.005 % ophthalmic solution Place 1 drop into both eyes at bedtime.     loratadine (CLARITIN)  10 MG tablet Take 10 mg by mouth daily.     methocarbamol (ROBAXIN) 500 MG tablet Take 1 tablet (500 mg total) by mouth at bedtime. 30 tablet 0   metoprolol succinate (TOPROL-XL) 25 MG 24 hr tablet Take 1 tablet (25 mg total) by mouth daily. 30 tablet 0   Multiple Vitamin (MULTIVITAMIN WITH MINERALS) TABS tablet Take 1 tablet by mouth daily.     ondansetron (ZOFRAN) 8 MG tablet Take 1 tablet (8 mg total) by mouth every 8 (eight) hours as needed for refractory nausea / vomiting. Start on day 3 after chemo. 90 tablet 1   pantoprazole (PROTONIX) 20 MG tablet TAKE 1 TABLET(20 MG) BY MOUTH DAILY 60 tablet 2   SPIRIVA HANDIHALER 18 MCG inhalation capsule INHALE 1 CAPSULE VIA HANDIHALER ONCE DAILY AT THE SAME TIME EVERY DAY 30 capsule 0   vitamin B-12 (CYANOCOBALAMIN) 1000 MCG tablet Take 1 tablet (1,000 mcg total) by mouth daily. 90 tablet 1   No current facility-administered medications for this visit.   Facility-Administered Medications Ordered in Other Visits  Medication Dose Route Frequency Provider Last Rate Last Admin   dexamethasone (DECADRON) 10 MG/ML injection            heparin lock flush 100 unit/mL  500 Units Intravenous Once Earlie Server, MD       heparin lock flush 100 unit/mL  500 Units Intravenous Once Earlie Server, MD       sodium chloride flush (NS) 0.9 % injection 10 mL  10 mL Intravenous PRN Earlie Server, MD   10 mL at 04/04/19 0903   sodium chloride flush (NS) 0.9 % injection 10 mL  10 mL Intravenous PRN Earlie Server, MD   10 mL at 07/26/20 1308   sodium chloride flush (NS) 0.9 % injection 10 mL  10 mL Intravenous PRN Earlie Server, MD   10 mL at 07/28/21 0828       IMAGE STUDIES:  CT CHEST WO CONTRAST  Result Date: 02/23/2022 CLINICAL DATA:  Squamous cell lung cancer restaging. Prior radiation and chemotherapy. * Tracking Code: BO * EXAM: CT CHEST WITHOUT CONTRAST TECHNIQUE: Multidetector CT imaging of the chest was performed following the standard protocol without IV contrast. RADIATION DOSE  REDUCTION: This exam was performed according to the departmental dose-optimization program which includes automated exposure control, adjustment of the mA and/or kV according to patient size and/or use of iterative reconstruction technique. COMPARISON:  02/10/2022 FINDINGS: Cardiovascular: Coronary, aortic arch, and branch vessel atherosclerotic vascular disease. Left Port-A-Cath tip: SVC. Small to moderate pericardial effusion similar to prior. Mediastinum/Nodes: No pathologic adenopathy identified. Lungs/Pleura: Centrilobular emphysema.  Prior right upper lobectomy. Similar bandlike consolidation in the right suprahilar perihilar region characteristic of radiation fibrosis/pneumonitis involving the right middle lobe and right lower lobe. Progressive bilobed cavitary process anteriorly in the right lower lobe measuring 4.9 by 4.3 cm on image 94 series 3, previously the 2 lobulations were more separated and together measured 4.6 by 1.9 cm. Small air-fluid level in an adjacent bulla. Increased scarring at the right lung base with small but increased right pleural effusion. Enhancement noted along the right pleural margin. Bandlike peribronchovascular nodule in the left upper lobe measuring 6 by 4 mm on axial image 49 series 3, previously 4 by 3 mm. Peribronchovascular nodule more caudad in the right upper lobe measuring 1.2 by 0.5 cm on image 59 series  3, formerly 1.1 by 0.5 cm. Upper Abdomen: Unremarkable Musculoskeletal: Unremarkable IMPRESSION: 1. Progressive airspace opacity along the cavitary process anteriorly in the right lower lobe, probably from cavitary pneumonia given the substantial progression over the last 3-4 weeks. Small but increased right pleural effusion, nonspecific for transudative or exudative etiology. 2. Stable peribronchovascular nodules in the left upper lobe. 3. Other imaging findings of potential clinical significance: Aortic Atherosclerosis (ICD10-I70.0) and Emphysema (ICD10-J43.9).  Coronary atherosclerosis. Prior right upper lobectomy. Electronically Signed   By: Van Clines M.D.   On: 02/23/2022 13:40   US Venous Img Lower Bilateral  Result Date: 02/15/2022 CLINICAL DATA:  Bilateral leg swelling. EXAM: BILATERAL LOWER EXTREMITY VENOUS DOPPLER ULTRASOUND TECHNIQUE: Gray-scale sonography with compression, as well as color and duplex ultrasound, were performed to evaluate the deep venous system(s) from the level of the common femoral vein through the popliteal and proximal calf veins. COMPARISON:  None Available. FINDINGS: VENOUS Normal compressibility of the common femoral, superficial femoral, and popliteal veins, as well as the visualized calf veins. Visualized portions of profunda femoral vein and great saphenous vein unremarkable. No filling defects to suggest DVT on grayscale or color Doppler imaging. Doppler waveforms show normal direction of venous flow, normal respiratory plasticity and response to augmentation. Limited views of the contralateral common femoral vein are unremarkable. OTHER None. Limitations: none IMPRESSION: Negative. Electronically Signed   By: Fidela Salisbury M.D.   On: 02/15/2022 15:13   CT CHEST WO CONTRAST  Result Date: 02/10/2022 CLINICAL DATA:  Pneumonia suspected EXAM: CT CHEST WITHOUT CONTRAST TECHNIQUE: Multidetector CT imaging of the chest was performed following the standard protocol without IV contrast. RADIATION DOSE REDUCTION: This exam was performed according to the departmental dose-optimization program which includes automated exposure control, adjustment of the mA and/or kV according to patient size and/or use of iterative reconstruction technique. COMPARISON:  Chest CT dated February 01, 2022 FINDINGS: Cardiovascular: Mild cardiomegaly. Small pericardial effusion, similar to prior exam. Atherosclerotic disease of the thoracic aorta. Moderate coronary artery calcifications. Left chest wall port with tip in the mid SVC. Mediastinum/Nodes:  Thyroid is unremarkable. Esophagus is unremarkable. No pathologically enlarged lymph nodes seen in the chest. Lungs/Pleura: Moderate centrilobular emphysema. Similar bilateral mosaic attenuation.Right paramediastinal postradiation change, stable when compared with prior exam. Cavitary lesions of the anterior right lower lobe located on series 3, image 77 demonstrate increased wall thickening when compared with prior exam, reference cavitary lesion measuring 19 x 16 mm on series 3, image 77, previously 15 x 13 mm. New irregular solid nodule of the left upper lobe measures 11 x 7 mm on image 49. Previously seen clustered centrilobular nodules of the posterior right lower lobe have resolved. Upper Abdomen: No acute abnormality. Musculoskeletal: No chest wall mass or suspicious bone lesions identified. IMPRESSION: 1. Cavitary lesions of the anterior right lower lobe demonstrate increased wall thickening and there is a new irregular solid pulmonary nodule of the left upper lobe. 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 2. Stable right paramediastinal postradiation change. 3. Similar bilateral mosaic attenuation, findings can be seen in setting of small airways disease or pulmonary hypertension. 4. Aortic Atherosclerosis (ICD10-I70.0) and Emphysema (ICD10-J43.9). Electronically Signed   By: Yetta Glassman M.D.   On: 02/10/2022 15:16   DG Chest 2 View  Result Date: 02/10/2022 CLINICAL DATA:  Shortness of breath EXAM: CHEST - 2 VIEW COMPARISON:  02/01/2022 FINDINGS:  Left chest port with catheter tip approximating the SVC. Normal cardiac and mediastinal contours. Again noted is decreased volume in the right lung with unchanged linear densities in the right parahilar region and right lower lung, likely scarring. Blunting of the right costophrenic angle, chronic,  likely pleural thickening. Normal left costophrenic angle. Slightly increased opacity in the right mid lung, possibly related to the known cavitary lesions in this region. The left lung is clear. No pneumothorax. IMPRESSION: Slightly increased opacity in the right mid lung, which is nonspecific but may be infectious or related to the cavitary process in this region. Electronically Signed   By: Merilyn Baba M.D.   On: 02/10/2022 11:00   ECHOCARDIOGRAM COMPLETE  Result Date: 02/04/2022    ECHOCARDIOGRAM REPORT   Patient Name:   Samuel Choi Date of Exam: 02/03/2022 Medical Rec #:  [id number]      Height:       73.0 in Accession #:    [id number]     Weight:       128.1 lb Date of Birth:  08-22-1965       BSA:          1.780 m Patient Age:    56 years       BP:           123/87 mmHg Patient Gender: M              HR:           98 bpm. Exam Location:  [facility] Procedure: 2D Echo, Cardiac Doppler and Color Doppler Indications:     SVT (supraventricular tachycardia) ([place]) [[id number]]                  Pulmonary embolism ([place]) [[id number]]  History:         Patient has prior history of Echocardiogram examinations, most                  recent 01/01/2021. Risk Factors:Hypertension and ETOH.  Sonographer:     Bernadene Person RDCS Referring Phys:  [id number] Alanson Puls TANG Diagnosing Phys: Isaias Cowman MD  Sonographer Comments: Image acquisition challenging due to respiratory motion. IMPRESSIONS  1. Left ventricular ejection fraction, by estimation, is 50 to 55%. The left ventricle has low normal function. The left ventricle has no regional wall motion abnormalities. There is mild left ventricular hypertrophy. Left ventricular diastolic parameters are consistent with Grade I diastolic dysfunction (impaired relaxation).  2. Right ventricular systolic function is normal. The right ventricular size is mildly enlarged. There is severely elevated pulmonary artery systolic pressure.  3. Right atrial size was moderately dilated.  4.  The mitral valve is normal in structure. No evidence of mitral valve regurgitation. No evidence of mitral stenosis.  5. Tricuspid valve regurgitation is moderate to severe.  6. The aortic valve is normal in structure. Aortic valve regurgitation is not visualized. No aortic stenosis is present.  7. The inferior vena cava is normal in size with greater than 50% respiratory variability, suggesting right atrial pressure of 3 mmHg. FINDINGS  Left Ventricle: Left ventricular ejection fraction, by estimation, is 50 to 55%. The left ventricle has low normal function. The left ventricle has no regional wall motion abnormalities. The left ventricular internal cavity size was normal in size. There is mild left ventricular hypertrophy. Left ventricular diastolic parameters are consistent with Grade I diastolic dysfunction (impaired relaxation). Right Ventricle: The right ventricular size is mildly enlarged. No increase in right ventricular wall  thickness. Right ventricular systolic function is normal. There is severely elevated pulmonary artery systolic pressure. The tricuspid regurgitant velocity is 4.41 m/s, and with an assumed right atrial pressure of 3 mmHg, the estimated right ventricular systolic pressure is 92.3 mmHg. Left Atrium: Left atrial size was normal in size. Right Atrium: Right atrial size was moderately dilated. Pericardium: There is no evidence of pericardial effusion. Mitral Valve: The mitral valve is normal in structure. No evidence of mitral valve regurgitation. No evidence of mitral valve stenosis. Tricuspid Valve: The tricuspid valve is normal in structure. Tricuspid valve regurgitation is moderate to severe. No evidence of tricuspid stenosis. Aortic Valve: The aortic valve is normal in structure. Aortic valve regurgitation is not visualized. No aortic stenosis is present. Pulmonic Valve: The pulmonic valve was normal in structure. Pulmonic valve regurgitation is not visualized. No evidence of pulmonic  stenosis. Aorta: The aortic root is normal in size and structure. Venous: The inferior vena cava is normal in size with greater than 50% respiratory variability, suggesting right atrial pressure of 3 mmHg. IAS/Shunts: No atrial level shunt detected by color flow Doppler.  LEFT VENTRICLE PLAX 2D LVIDd:         4.00 cm   Diastology LVIDs:         2.95 cm   LV e' medial:    6.92 cm/s LV PW:         1.07 cm   LV E/e' medial:  9.8 LV IVS:        1.10 cm   LV e' lateral:   7.10 cm/s LVOT diam:     2.10 cm   LV E/e' lateral: 9.5 LV SV:         55 LV SV Index:   31 LVOT Area:     3.46 cm  RIGHT VENTRICLE RV S prime:     12.20 cm/s TAPSE (M-mode): 1.8 cm LEFT ATRIUM           Index        RIGHT ATRIUM           Index LA diam:      3.50 cm 1.97 cm/m   RA Area:     24.60 cm LA Vol (A2C): 30.6 ml 17.19 ml/m  RA Volume:   90.80 ml  51.00 ml/m LA Vol (A4C): 26.1 ml 14.66 ml/m  AORTIC VALVE LVOT Vmax:   115.00 cm/s LVOT Vmean:  73.900 cm/s LVOT VTI:    0.158 m  AORTA Ao Root diam: 3.30 cm Ao Asc diam:  3.20 cm MITRAL VALVE               TRICUSPID VALVE MV Area (PHT): 4.19 cm    TR Peak grad:   77.8 mmHg MV Decel Time: 181 msec    TR Vmax:        441.00 cm/s MV E velocity: 67.70 cm/s MV A velocity: 78.00 cm/s  SHUNTS MV E/A ratio:  0.87        Systemic VTI:  0.16 m                            Systemic Diam: 2.10 cm Isaias Cowman MD Electronically signed by Isaias Cowman MD Signature Date/Time: 02/04/2022/2:59:37 PM    Final    CT Angio Chest PE W and/or Wo Contrast  Result Date: 02/01/2022 CLINICAL DATA:  Lung cancer.  Shortness of breath and hypoxia. * Tracking Code: BO * EXAM: CT ANGIOGRAPHY  CHEST WITH CONTRAST TECHNIQUE: Multidetector CT imaging of the chest was performed using the standard protocol during bolus administration of intravenous contrast. Multiplanar CT image reconstructions and MIPs were obtained to evaluate the vascular anatomy. RADIATION DOSE REDUCTION: This exam was performed according to  the departmental dose-optimization program which includes automated exposure control, adjustment of the mA and/or kV according to patient size and/or use of iterative reconstruction technique. CONTRAST:  13m OMNIPAQUE IOHEXOL 350 MG/ML SOLN COMPARISON:  PET-CT 11/21/2021 and chest CT 11/15/2021 FINDINGS: Cardiovascular: Left Port-A-Cath tip: Distal SVC. Possible fibrin sheath distally. Scattered findings of chronic pulmonary embolus. This includes in the left pulmonary artery on image 185 where there are peripheral string like filling defects; the peripheral chronic filling defect along the margin of the right lower lobe pulmonary artery on image 213 series 7; an adjacent web-like filling defect in a segmental branch of the right lower lobe pulmonary artery on image 212 of series 7; and a web like filling defect at the origin of a left lower lobe segmental branch on image 260 series 7. These all have characteristic appearance of chronic embolus, and I do not discern current findings of acute pulmonary embolus or substantial worsening of appearance compared to prior exams. Mild prominence the main pulmonary artery at 3.7 cm diameter, potentially reflecting pulmonary arterial hypertension. Coronary, aortic arch, and branch vessel atherosclerotic vascular disease. Mediastinum/Nodes: No pathologic adenopathy. Lungs/Pleura: Right upper lobectomy. Stable radiation therapy related findings in the right perihilar region. Improved airspace opacity around peripheral cavitary process in the right lower lobe, currently the cavity or air cyst measures 1.4 by 1.4 cm on image 86 of series 6 and has wall thickness of about 1-2 mm. Similarly mild wall thickening associated with a second bulla or small cavity more medially on image 87 series 6. Patchy peripheral mainly tree-in-bud reticulonodular opacities in the posterior basal segment right lower lobe, images 95 through 135 series 6, favoring atypical infection. Airway thickening  and some airway plugging in the right lower lobe. Mosaic attenuation in both lungs. Underlying emphysema. Mild tree-in-bud reticulonodular opacities increased in the posterior basal segment left lower lobe and in the lingula. Upper Abdomen: Unremarkable Musculoskeletal: Unremarkable Review of the MIP images confirms the above findings. IMPRESSION: 1. Bilateral chronic pulmonary embolus but no acute pulmonary embolus. 2. Probable pulmonary arterial hypertension. 3.  Aortic Atherosclerosis (ICD10-I70.0).  Coronary atherosclerosis. 4. Improved airspace opacity along the peripheral cavitary process in the right lower lobe, currently the thin walled air cyst or cavity measures about 1.4 cm in diameter. 5. Increased peripheral tree-in-bud reticulonodular opacities in both lower lobes in the lingula, compatible with atypical infection. 6. Airway thickening and airway plugging particularly in the right lower lobe. 7. Mosaic attenuation in the lungs, stable but nonspecific. 8.  Emphysema (ICD10-J43.9). 9. Stable radiation therapy related findings in the right perihilar regions. Right upper lobectomy. Electronically Signed   By: WVan ClinesM.D.   On: 02/01/2022 17:21   DG Chest 2 View  Result Date: 02/01/2022 CLINICAL DATA:  Shortness of breath EXAM: CHEST - 2 VIEW COMPARISON:  Previous studies including examination of 0August 04, 2023FINDINGS: Cardiac size is within normal limits. There is decreased volume right lung. Linear densities are seen in right parahilar region and right lower lung field with no significant change suggesting scarring. Blunting of right lateral CP angle may suggest pleural thickening. There are no new infiltrates or signs of pulmonary edema. There is no pneumothorax. Tip of chest port is seen in the course of  the superior vena cava. IMPRESSION: There are no new infiltrates or signs of pulmonary edema. Electronically Signed   By: Elmer Picker M.D.   On: 02/01/2022 12:37   DG Chest 2  View  Result Date: 01/31/2022 CLINICAL DATA:  Persistent cough EXAM: CHEST - 2 VIEW COMPARISON:  02/04/2020 FINDINGS: Postsurgical changes of right upper lobectomy with interstitial thickening and scarring. Unchanged blunting of the right costophrenic angle, likely scarring. Unchanged cardiac and mediastinal contours. Left chest port with catheter tip in the SVC. The left lung is clear. No acute osseous abnormality. IMPRESSION: Stable chest radiograph. Electronically Signed   By: Merilyn Baba M.D.   On: 01/31/2022 13:16

## 2022-04-07 NOTE — Assessment & Plan Note (Signed)
Patient has been treated with antibiotics.  He has upcoming repeat CT scan in October 2023.  Recommend patient to continue follow-up with pulmonology

## 2022-04-08 ENCOUNTER — Encounter: Payer: Self-pay | Admitting: Oncology

## 2022-04-11 ENCOUNTER — Ambulatory Visit
Admission: RE | Admit: 2022-04-11 | Discharge: 2022-04-11 | Disposition: A | Discharge status: Home / Self Care | Payer: 59 | Source: Ambulatory Visit | Attending: Pulmonary Disease | Admitting: Pulmonary Disease

## 2022-04-11 DIAGNOSIS — J850 Gangrene and necrosis of lung: Secondary | ICD-10-CM | POA: Insufficient documentation

## 2022-04-13 ENCOUNTER — Telehealth: Payer: Self-pay

## 2022-04-13 DIAGNOSIS — C3491 Malignant neoplasm of unspecified part of right bronchus or lung: Secondary | ICD-10-CM

## 2022-04-13 NOTE — Telephone Encounter (Signed)
Called and spoke to pt, informed him of CT results. Pneumonia has improved. Will hold off immunotherapy for now. Also discussed follow up plan with pt.   Please schedule and inform pt of appt:   Lab (cbc,cmp, ldh)/ CT CAP in 3 months  MD a few days after lab/CT

## 2022-04-14 ENCOUNTER — Encounter: Payer: Self-pay | Admitting: Oncology

## 2022-04-17 LAB — ACID FAST CULTURE WITH REFLEXED SENSITIVITIES (MYCOBACTERIA): Acid Fast Culture: NEGATIVE

## 2022-04-19 ENCOUNTER — Telehealth: Payer: Self-pay | Admitting: *Deleted

## 2022-04-19 NOTE — Telephone Encounter (Signed)
CAll from Medical Director of [facility] wanting Dr Tasia Catchings and staff to know that patient is eligible for free case management through his wife's employer insurance. He has refused this service twice and she feels he does not understand the benefit it could offer him and asks that we discuss with him. If he is interested in it , he can call Amy at [facility]

## 2022-04-28 LAB — MTB-RIF NAA WITH AFB CULTURE, SPUTUM: Acid Fast Culture: NEGATIVE

## 2022-05-06 ENCOUNTER — Other Ambulatory Visit: Payer: Self-pay | Admitting: Oncology

## 2022-05-08 ENCOUNTER — Encounter (INDEPENDENT_AMBULATORY_CARE_PROVIDER_SITE_OTHER): Payer: Self-pay

## 2022-05-09 ENCOUNTER — Encounter: Payer: Self-pay | Admitting: Oncology

## 2022-05-17 ENCOUNTER — Ambulatory Visit (INDEPENDENT_AMBULATORY_CARE_PROVIDER_SITE_OTHER): Payer: 59 | Admitting: Pulmonary Disease

## 2022-05-17 ENCOUNTER — Encounter: Payer: Self-pay | Admitting: Pulmonary Disease

## 2022-05-17 VITALS — BP 124/68 | HR 97 | Temp 98.3°F | Ht 73.0 in | Wt 132.2 lb

## 2022-05-17 DIAGNOSIS — J850 Gangrene and necrosis of lung: Secondary | ICD-10-CM

## 2022-05-17 DIAGNOSIS — J449 Chronic obstructive pulmonary disease, unspecified: Secondary | ICD-10-CM | POA: Diagnosis not present

## 2022-05-17 DIAGNOSIS — J9611 Chronic respiratory failure with hypoxia: Secondary | ICD-10-CM

## 2022-05-17 DIAGNOSIS — I272 Pulmonary hypertension, unspecified: Secondary | ICD-10-CM

## 2022-05-17 DIAGNOSIS — C3491 Malignant neoplasm of unspecified part of right bronchus or lung: Secondary | ICD-10-CM

## 2022-05-17 MED ORDER — BUDESONIDE-FORMOTEROL FUMARATE 160-4.5 MCG/ACT IN AERO: 2.0000 | INHALATION_SPRAY | Freq: Two times a day (BID) | RESPIRATORY_TRACT | 11 refills | Status: DC

## 2022-05-17 NOTE — Progress Notes (Signed)
Subjective:    Patient ID: Samuel Choi, male    DOB: 08/15/1965, 56 y.o.   MRN: [id number] Patient Care Team: Earlie Server, MD as PCP - General (Oncology) Earlie Server, MD as Consulting Physician (Hematology and Oncology) Tsosie Billing, MD as Consulting Physician (Infectious Diseases) Borders, Kirt Boys, NP as Nurse Practitioner (Hospice and Palliative Medicine) Tyler Pita, MD as Consulting Physician (Pulmonary Disease)  Chief Complaint  Patient presents with   Follow-up    Feeling better. No SOB or wheezing. Cough with clear sputum.   HPI Samuel Choi is a 57 year old former smoker, with a history as noted below, who presents for follow-up on the issue of abnormal imaging of the lung in the setting of squamous cell carcinoma of the right lung.  He was noted to have cavitary lesions in the lung on the right.  He had been worked up while during a hospitalization to [facility] but only sputum for MTB probe was obtained.  Patient was seen on 23 August at the clinic, and at that time he was still producing quite a bit of sputum.  His cavitary lesions on the right lower lobe appeared to have progressed significantly and rapidly.  This was consistent with an infectious process.  Microbiology of the sputum collected on 25 August showed that the patient had 2 different strains of Pseudomonas that were quite resistant.  He was referred to the infectious disease clinic and Dr. Delaine Lame saw the patient and started him on IV meropenem.  He has completed meropenem course.  He had a follow-up chest CT on 11 April 2022 and this showed significant improvement on the cavitary process.  The patient states that since he started antibiotics he has felt he has more stamina.  He presents fully ambulatory today, he does note dyspnea on exertion.  Cough has been rare and nonproductive since he completed antibiotics.  No hemoptysis. He states he is compliant with Symbicort 80/4.5, 2 puffs twice a day and Spiriva daily.   He has not had issues with diarrhea (recall that he had an issue with C. difficile previously).  No orthopnea or paroxysmal nocturnal dyspnea.  No lower extremity edema and no calf tenderness.  He notes his appetite and food intake continue to improve.   Overall he feels well but has noted dyspnea on exertion as above, he appears chronically ill but more interactive and fully ambulatory today.   Review of Systems A 10 point review of systems was performed and it is as noted above otherwise negative.  Patient Active Problem List   Diagnosis Date Noted   Necrotic pneumonia ([place]) 04/07/2022   Cavitary lesion of lung 02/11/2022   Acute respiratory failure ([place]) 02/10/2022   Lactic acidosis 02/10/2022   SVT (supraventricular tachycardia) 02/02/2022   Sepsis ([place]) 02/01/2022   Hyponatremia 02/01/2022   Chronic diastolic CHF (congestive heart failure) ([place]) 02/01/2022   Macrocytic anemia 02/01/2022   Elevated serum creatinine 01/25/2022   Diverticulosis of colon 11/18/2021   History of thrombosis 11/18/2021   Weight loss 11/09/2021   C. difficile colitis 10/19/2021   Hyperkalemia 07/28/2021   Severe protein-calorie malnutrition ([place]) 06/08/2021   Antineoplastic chemotherapy induced anemia 06/08/2021   Pulmonary embolus ([place]) 06/08/2021   Chemotherapy induced nausea and vomiting 05/17/2021   Symptomatic anemia 05/17/2021   Wide-complex tachycardia    Chemotherapy induced neutropenia ([place]) 11/09/2020   COPD (chronic obstructive pulmonary disease) ([place]) 04/22/2020   Sinus tachycardia 12/18/2019   Hypomagnesemia 12/10/2019   Encounter for antineoplastic immunotherapy  11/24/2019   Ex-smoker 07/23/2019   Port-A-Cath in place 04/04/2019   Thrombocytopenia ([place]) 04/04/2019   Folate deficiency 04/04/2019   Hypokalemia 04/04/2019   B12 deficiency 04/04/2019   Alcohol abuse 04/04/2019   Dehydration 02/07/2019   Encounter for antineoplastic chemotherapy 01/31/2019   Squamous cell carcinoma  of right lung ([place]) 09/03/2018   Goals of care, counseling/discussion 09/03/2018   Malnutrition of moderate degree 08/23/2018   Emphysema of lung ([place]) 06/2018   Allergic rhinitis 06/2018   H/O nasal polyp 02/20/2018   Acute sinusitis 10/10/2017   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/10/2017   Chronic rhinitis 10/10/2017   Mass of sinus 10/10/2017   Essential hypertension 09/27/2017   Tobacco abuse 09/27/2017   Social History   Tobacco Use   Smoking status: Former    Packs/day: 0.50    Years: 15.00    Total pack years: 7.50    Types: Cigarettes    Quit date: 08/2018    Years since quitting: 3.7   Smokeless tobacco: Never  Substance Use Topics   Alcohol use: Yes    Alcohol/week: 3.0 standard drinks of alcohol    Types: 3 Cans of beer per week    Comment: usually 2 drinks per week, per patient   No Known Allergies  Current Meds  Medication Sig   albuterol (PROVENTIL) (2.5 MG/3ML) 0.083% nebulizer solution Take 3 mLs (2.5 mg total) by nebulization every 6 (six) hours as needed for wheezing or shortness of breath.   albuterol (VENTOLIN HFA) 108 (90 Base) MCG/ACT inhaler INHALE 2 PUFFS BY MOUTH EVERY 6 HOURS AS NEEDED FOR WHEEZING OR SHORTNESS OF BREATH   apixaban (ELIQUIS) 5 MG TABS tablet Take 1 tablet (5 mg total) by mouth 2 (two) times daily.   benzonatate (TESSALON) 100 MG capsule Take 1 capsule (100 mg total) by mouth 3 (three) times daily as needed for cough.   budesonide-formoterol (SYMBICORT) 80-4.5 MCG/ACT inhaler Inhale 2 puffs into the lungs 2 (two) times daily. in the morning and at bedtime.   diltiazem (CARDIZEM CD) 120 MG 24 hr capsule Take 120 mg by mouth daily.   feeding supplement (ENSURE ENLIVE / ENSURE PLUS) LIQD Take 237 mLs by mouth 3 (three) times daily between meals.   HYDROcodone-acetaminophen (NORCO/VICODIN) 5-325 MG tablet Take 1 tablet by mouth every 8 (eight) hours as needed for severe pain.   latanoprost (XALATAN) 0.005 % ophthalmic solution Place 1 drop into  both eyes at bedtime.   loratadine (CLARITIN) 10 MG tablet Take 10 mg by mouth daily.   methocarbamol (ROBAXIN) 500 MG tablet Take 1 tablet (500 mg total) by mouth at bedtime.   metoprolol succinate (TOPROL-XL) 25 MG 24 hr tablet Take 1 tablet (25 mg total) by mouth daily.   Multiple Vitamin (MULTIVITAMIN WITH MINERALS) TABS tablet Take 1 tablet by mouth daily.   ondansetron (ZOFRAN) 8 MG tablet Take 1 tablet (8 mg total) by mouth every 8 (eight) hours as needed for refractory nausea / vomiting. Start on day 3 after chemo.   pantoprazole (PROTONIX) 20 MG tablet TAKE 1 TABLET(20 MG) BY MOUTH DAILY   SPIRIVA HANDIHALER 18 MCG inhalation capsule INHALE 1 CAPSULE VIA HANDIHALER ONCE DAILY AT THE SAME TIME EVERY DAY   vitamin B-12 (CYANOCOBALAMIN) 1000 MCG tablet Take 1 tablet (1,000 mcg total) by mouth daily.   Immunization History  Administered Date(s) Administered   Influenza Inj Mdck Quad Pf 03/28/2019   Influenza, High Dose Seasonal PF 03/28/2019   Influenza-Unspecified 03/31/2022   Moderna Sars-Covid-2 Vaccination  11/14/2019   Pneumococcal Conjugate-13 02/17/2021   Pneumococcal Polysaccharide-23 11/19/2018   Tdap 12/31/2017   Zoster Recombinat (Shingrix) 03/28/2019, 05/01/2019, 06/09/2019       Objective:   Physical Exam BP 124/68 (BP Location: Left Arm, Cuff Size: Normal)   Pulse 97   Temp 98.3 F (36.8 C)   Ht 6\' 1"  (1.854 m)   Wt 132 lb 3.2 oz (60 kg)   SpO2 96%   BMI 17.44 kg/m  GENERAL: Thin, frail-appearing gentleman, presents fully ambulatory.  No acute distress. HEAD: Normocephalic, atraumatic.  Alopecia due to chemotherapy. EYES: Pupils equal, round, reactive to light.  No scleral icterus.  Occasional disconjugate gaze (exotropia of right eye). MOUTH: Poor dentition, oral mucosa moist.  No thrush. NECK: Supple. No thyromegaly. Trachea midline. No JVD.  No adenopathy. PULMONARY: Good air entry bilaterally.  There are scattered wheezes throughout. CARDIOVASCULAR: S1  and S2.  Tachycardic with regular rhythm.  No rubs, murmurs or gallops heard. ABDOMEN: Scaphoid, otherwise benign. MUSCULOSKELETAL: No joint deformity, no clubbing, no edema.  Significant sarcopenia. NEUROLOGIC: Fully ambulatory today, occasional disconjugate gaze.  Otherwise no focality. SKIN: Intact,warm,dry. PSYCH: Flat affect.  Representative images from chest CT performed 22 February 2022 (first panel) and 11 April 2022 (second panel) showing evolution of changes post therapy for Pseudomonas cavitating pneumonia:    Ambulatory oximetry was performed: Resting heart rate was 95 bpm resting oxygen saturation on room air was 91% patient was only able to do 2 laps due to shortness of breath.  Oxygen saturations decreased to 86%, patient was placed on 2 L/min and was able to maintain 94% O2 sats.    Assessment & Plan:     ICD-10-CM   1. Stage 2 moderate COPD by GOLD classification (HCC)  J44.9 AMB REFERRAL FOR DME   Will increase Symbicort to 160/4.5, 2 inhalations twice a day Continue Spiriva HandiHaler 1 capsule daily Continue as needed albuterol    2. Chronic respiratory failure with hypoxia (HCC)  J96.11    Nocturnal hypoxemia D/T pulmonary hypertension Has progressed to exertional hypoxemia Order for 2 L/min with exertion and with sleep  - [employer]    3. Necrotizing pneumonia ([place])  J85.0 CT CHEST WO CONTRAST   2 strains of Pseudomonas identified previously Completed course with meropenem CT improving Repeat CT chest 4 to 6 weeks    4. Severe pulmonary hypertension (HCC)  I27.20    This issue adds complexity to his management Hypoxemia likely more related to this    5. 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    Answer:   [facility]    Referral Priority:   Routine    Referral Type:   Durable Medical Equipment Purchase    Number of Visits Requested:   1   Meds ordered this encounter  Medications   budesonide-formoterol (SYMBICORT) 160-4.5 MCG/ACT inhaler    Sig: Inhale 2 puffs into the lungs 2 (two) times daily.    Dispense:  10.2 g    Refill:  11   We will see the patient in  follow-up in 3 months time he is to contact us prior to that time should any new difficulties arise.  We will notify him of the results of his CT chest when available.  Renold Don, MD Advanced Bronchoscopy PCCM [facility]    *This note was dictated using voice recognition software/Dragon.  Despite best efforts to proofread, errors can occur which can change the meaning. Any transcriptional errors that result from this process are unintentional and may not be fully corrected at the time of dictation.

## 2022-05-17 NOTE — Patient Instructions (Signed)
I have increased the strength of your Symbicort so the new prescription was sent to your pharmacy.  You using Spiriva as well.  Also you may use your rescue inhaler if you get short of breath even today.  We are going to get a CT chest in 4 to 6 weeks to make sure that the area of infection in the lung has cleared out completely.  We will call you the results of that CT.  We will see you in follow-up in 3 months time call sooner should any new problems arise.

## 2022-05-19 ENCOUNTER — Telehealth: Payer: Self-pay | Admitting: Pulmonary Disease

## 2022-05-19 NOTE — Telephone Encounter (Signed)
I spoke with [name] at [facility]. She will look at the referral and see what else is need and call us back.

## 2022-05-19 NOTE — Telephone Encounter (Signed)
Spoke to patient and relayed below message. He voiced his understanding.  He will contact [facility] to reschedule.  Nothing further needed.

## 2022-05-29 ENCOUNTER — Ambulatory Visit: Payer: 59 | Attending: Radiation Oncology | Admitting: Radiation Oncology

## 2022-05-31 ENCOUNTER — Telehealth: Payer: Self-pay | Admitting: Pulmonary Disease

## 2022-05-31 NOTE — Telephone Encounter (Signed)
Spoke to patient. He is requesting POC. Order was placed to [facility] on 05/17/2022. He stated that he is getting the run around with [facility]. He also mentioned that his oxygen tanks need to be changed out as they are not holding oxygen. He stated that he reached out to [facility] regarding this issue and was advised to contact our office.   Lm x1 for [name] with [facility].

## 2022-05-31 NOTE — Telephone Encounter (Signed)
Per [name] with [facility]- A service call was placed on 05/04/2022. Patient will need POC walk. [name] will contact patient to schedule and get more information on oxygen tanks.   Patient is aware to expect a call from [facility]. He voiced his understanding.  Nothing further needed.

## 2022-05-31 NOTE — Telephone Encounter (Signed)
Patient called to request a new order for a portable oxygen machine.  Please advise and call patient to discuss further at [phone]

## 2022-06-21 ENCOUNTER — Ambulatory Visit
Admission: RE | Admit: 2022-06-21 | Discharge: 2022-06-21 | Disposition: A | Discharge status: Home / Self Care | Payer: 59 | Source: Ambulatory Visit | Attending: Oncology | Admitting: Oncology

## 2022-06-21 DIAGNOSIS — I700 Atherosclerosis of aorta: Secondary | ICD-10-CM | POA: Insufficient documentation

## 2022-06-21 DIAGNOSIS — B9689 Other specified bacterial agents as the cause of diseases classified elsewhere: Secondary | ICD-10-CM | POA: Diagnosis not present

## 2022-06-21 DIAGNOSIS — I3139 Other pericardial effusion (noninflammatory): Secondary | ICD-10-CM | POA: Insufficient documentation

## 2022-06-21 DIAGNOSIS — J8410 Pulmonary fibrosis, unspecified: Secondary | ICD-10-CM | POA: Diagnosis not present

## 2022-06-21 DIAGNOSIS — J850 Gangrene and necrosis of lung: Secondary | ICD-10-CM | POA: Diagnosis present

## 2022-06-21 DIAGNOSIS — J439 Emphysema, unspecified: Secondary | ICD-10-CM | POA: Diagnosis not present

## 2022-06-26 ENCOUNTER — Other Ambulatory Visit: Payer: Self-pay | Admitting: *Deleted

## 2022-06-26 DIAGNOSIS — C3491 Malignant neoplasm of unspecified part of right bronchus or lung: Secondary | ICD-10-CM

## 2022-06-27 ENCOUNTER — Encounter: Payer: Self-pay | Admitting: *Deleted

## 2022-06-27 ENCOUNTER — Telehealth: Payer: Self-pay | Admitting: *Deleted

## 2022-06-27 MED ORDER — BUDESONIDE-FORMOTEROL FUMARATE 160-4.5 MCG/ACT IN AERO: 2.0000 | INHALATION_SPRAY | Freq: Two times a day (BID) | RESPIRATORY_TRACT | 11 refills | Status: DC

## 2022-06-27 MED ORDER — METHOCARBAMOL 500 MG PO TABS: 500.0000 mg | ORAL_TABLET | Freq: Every day | ORAL | 0 refills | Status: DC

## 2022-06-27 MED ORDER — PANTOPRAZOLE SODIUM 20 MG PO TBEC: DELAYED_RELEASE_TABLET | ORAL | 2 refills | Status: DC

## 2022-06-27 MED ORDER — METOPROLOL SUCCINATE ER 25 MG PO TB24: 25.0000 mg | ORAL_TABLET | Freq: Every day | ORAL | 0 refills | Status: DC

## 2022-06-27 MED ORDER — VITAMIN B-12 1000 MCG PO TABS: 1000.0000 ug | ORAL_TABLET | Freq: Every day | ORAL | 1 refills | Status: AC

## 2022-06-27 MED ORDER — ALBUTEROL SULFATE HFA 108 (90 BASE) MCG/ACT IN AERS: INHALATION_SPRAY | RESPIRATORY_TRACT | 5 refills | Status: DC

## 2022-06-27 MED ORDER — BENZONATATE 100 MG PO CAPS: 100.0000 mg | ORAL_CAPSULE | Freq: Three times a day (TID) | ORAL | 2 refills | Status: DC | PRN

## 2022-06-27 MED ORDER — ONDANSETRON HCL 8 MG PO TABS: 8.0000 mg | ORAL_TABLET | Freq: Three times a day (TID) | ORAL | 1 refills | Status: DC | PRN

## 2022-06-27 MED ORDER — APIXABAN 5 MG PO TABS: 5.0000 mg | ORAL_TABLET | Freq: Two times a day (BID) | ORAL | 2 refills | Status: DC

## 2022-06-27 MED ORDER — HYDROCODONE-ACETAMINOPHEN 5-325 MG PO TABS: 1.0000 | ORAL_TABLET | Freq: Three times a day (TID) | ORAL | 0 refills | Status: DC | PRN

## 2022-06-27 MED ORDER — LORATADINE 10 MG PO TABS: 10.0000 mg | ORAL_TABLET | Freq: Every day | ORAL | 3 refills | Status: DC

## 2022-06-27 NOTE — Telephone Encounter (Signed)
Newt Minion Key: [id number] - Rx #: [id number] PA required for HYDROcodone-Acetaminophen 5-325MG  tablets

## 2022-06-27 NOTE — Telephone Encounter (Signed)
-----   Message from Secundino Ginger sent at 06/27/2022  1:16 PM EST ----- Regarding: [place] sent to his chart.

## 2022-06-27 NOTE — Telephone Encounter (Signed)
PA submitted via covermymeds.

## 2022-06-29 NOTE — Telephone Encounter (Signed)
N/Aon December 19 This medication or product is on your plan's list of covered drugs. Prior authorization is not required at this time. If your pharmacy has questions regarding the processing of your prescription, please have them call the [facility] pharmacy help desk at [phone]. **Please note: This request was submitted electronically. Formulary lowering, tiering exception, cost reduction and/or pre-benefit determination review (including prospective Medicare hospice reviews) requests cannot be requested using this method of submission. Providers contact us at [phone] for further assistance.

## 2022-07-04 ENCOUNTER — Encounter: Payer: Self-pay | Admitting: Oncology

## 2022-07-06 ENCOUNTER — Telehealth: Payer: Self-pay | Admitting: *Deleted

## 2022-07-06 NOTE — Telephone Encounter (Signed)
Opened phone encounter in error

## 2022-07-13 ENCOUNTER — Encounter: Payer: Self-pay | Admitting: Oncology

## 2022-07-13 ENCOUNTER — Inpatient Hospital Stay: Payer: 59 | Attending: Oncology

## 2022-07-13 DIAGNOSIS — Z5111 Encounter for antineoplastic chemotherapy: Secondary | ICD-10-CM

## 2022-07-13 DIAGNOSIS — Z86718 Personal history of other venous thrombosis and embolism: Secondary | ICD-10-CM | POA: Insufficient documentation

## 2022-07-13 DIAGNOSIS — Z79899 Other long term (current) drug therapy: Secondary | ICD-10-CM | POA: Insufficient documentation

## 2022-07-13 DIAGNOSIS — Z87891 Personal history of nicotine dependence: Secondary | ICD-10-CM | POA: Diagnosis not present

## 2022-07-13 DIAGNOSIS — Z7901 Long term (current) use of anticoagulants: Secondary | ICD-10-CM | POA: Diagnosis not present

## 2022-07-13 DIAGNOSIS — C3491 Malignant neoplasm of unspecified part of right bronchus or lung: Secondary | ICD-10-CM | POA: Insufficient documentation

## 2022-07-13 LAB — COMPREHENSIVE METABOLIC PANEL
ALT: 12 U/L (ref 0–44)
AST: 23 U/L (ref 15–41)
Albumin: 3.7 g/dL (ref 3.5–5.0)
Alkaline Phosphatase: 103 U/L (ref 38–126)
Anion gap: 13 (ref 5–15)
BUN: 13 mg/dL (ref 6–20)
CO2: 25 mmol/L (ref 22–32)
Calcium: 9.1 mg/dL (ref 8.9–10.3)
Chloride: 101 mmol/L (ref 98–111)
Creatinine, Ser: 1.02 mg/dL (ref 0.61–1.24)
GFR, Estimated: >60 mL/min (ref >60)
Glucose, Bld: 93 mg/dL (ref 70–99)
Potassium: 3.5 mmol/L (ref 3.5–5.1)
Sodium: 139 mmol/L (ref 135–145)
Total Bilirubin: 0.6 mg/dL (ref 0.3–1.2)
Total Protein: 7.9 g/dL (ref 6.5–8.1)

## 2022-07-13 LAB — CBC WITH DIFFERENTIAL/PLATELET
Abs Immature Granulocytes: 0.06 10*3/uL (ref 0.00–0.07)
Basophils Absolute: 0.1 10*3/uL (ref 0.0–0.1)
Basophils Relative: 1 %
Eosinophils Absolute: 0.1 10*3/uL (ref 0.0–0.5)
Eosinophils Relative: 1 %
HCT: 40.6 % (ref 39.0–52.0)
Hemoglobin: 13.9 g/dL (ref 13.0–17.0)
Immature Granulocytes: 1 %
Lymphocytes Relative: 10 %
Lymphs Abs: 1.3 10*3/uL (ref 0.7–4.0)
MCH: 34.4 pg — ABNORMAL HIGH (ref 26.0–34.0)
MCHC: 34.2 g/dL (ref 30.0–36.0)
MCV: 100.5 fL — ABNORMAL HIGH (ref 80.0–100.0)
Monocytes Absolute: 1 10*3/uL (ref 0.1–1.0)
Monocytes Relative: 8 %
Neutro Abs: 10.5 10*3/uL — ABNORMAL HIGH (ref 1.7–7.7)
Neutrophils Relative %: 79 %
Platelets: 175 10*3/uL (ref 150–400)
RBC: 4.04 MIL/uL — ABNORMAL LOW (ref 4.22–5.81)
RDW: 14.4 % (ref 11.5–15.5)
WBC: 13.1 10*3/uL — ABNORMAL HIGH (ref 4.0–10.5)
nRBC: 0 % (ref 0.0–0.2)

## 2022-07-13 LAB — TSH: TSH: 1.263 u[IU]/mL (ref 0.350–4.500)

## 2022-07-13 LAB — MAGNESIUM: Magnesium: 1.3 mg/dL — ABNORMAL LOW (ref 1.7–2.4)

## 2022-07-13 LAB — LACTATE DEHYDROGENASE: LDH: 110 U/L (ref 98–192)

## 2022-07-14 ENCOUNTER — Inpatient Hospital Stay: Payer: 59

## 2022-07-14 ENCOUNTER — Other Ambulatory Visit: Payer: Self-pay

## 2022-07-14 ENCOUNTER — Telehealth: Payer: Self-pay

## 2022-07-14 DIAGNOSIS — C3491 Malignant neoplasm of unspecified part of right bronchus or lung: Secondary | ICD-10-CM | POA: Diagnosis not present

## 2022-07-14 LAB — T3, FREE: T3, Free: 2.9 pg/mL (ref 2.0–4.4)

## 2022-07-14 MED ORDER — SODIUM CHLORIDE 0.9 % IV SOLN
INTRAVENOUS | Status: DC
  Filled 2022-07-14: qty 250

## 2022-07-14 MED ORDER — MAGNESIUM SULFATE 4 GM/100ML IV SOLN
4.0000 g | Freq: Once | INTRAVENOUS | Status: AC
  Administered 2022-07-14: 4 g via INTRAVENOUS
  Filled 2022-07-14: qty 100

## 2022-07-14 MED ORDER — HEPARIN SOD (PORK) LOCK FLUSH 100 UNIT/ML IV SOLN
500.0000 [IU] | Freq: Once | INTRAVENOUS | Status: AC
  Administered 2022-07-14: 500 [IU]
  Filled 2022-07-14: qty 5

## 2022-07-14 MED ORDER — SODIUM CHLORIDE 0.9% FLUSH
10.0000 mL | Freq: Once | INTRAVENOUS | Status: AC
  Administered 2022-07-14: 10 mL via INTRAVENOUS
  Filled 2022-07-14: qty 10

## 2022-07-14 NOTE — Telephone Encounter (Signed)
-----   Message from Earlie Server, MD sent at 07/13/2022 10:00 PM EST ----- Mag is low, please arrange him to get IV Magnesium 4g x 1 on 1/5 Add lab Mag +/- IV Mag when he comes to see me thanks.

## 2022-07-14 NOTE — Telephone Encounter (Signed)
AVS given to pt with updated appts

## 2022-07-14 NOTE — Telephone Encounter (Signed)
Pt arranged to get 4g of mag today.   Please add lab (mag) and poss IV mag to appt on 1/15.

## 2022-07-17 ENCOUNTER — Ambulatory Visit
Admission: RE | Admit: 2022-07-17 | Discharge: 2022-07-17 | Disposition: A | Discharge status: Home / Self Care | Payer: Medicare Other | Source: Ambulatory Visit | Attending: Oncology | Admitting: Oncology

## 2022-07-17 ENCOUNTER — Other Ambulatory Visit: Payer: 59

## 2022-07-17 DIAGNOSIS — C3491 Malignant neoplasm of unspecified part of right bronchus or lung: Secondary | ICD-10-CM | POA: Diagnosis present

## 2022-07-17 MED ORDER — IOHEXOL 300 MG/ML  SOLN
85.0000 mL | Freq: Once | INTRAMUSCULAR | Status: AC | PRN
  Administered 2022-07-17: 85 mL via INTRAVENOUS

## 2022-07-24 ENCOUNTER — Encounter: Payer: Self-pay | Admitting: Oncology

## 2022-07-24 ENCOUNTER — Inpatient Hospital Stay (HOSPITAL_BASED_OUTPATIENT_CLINIC_OR_DEPARTMENT_OTHER): Payer: 59 | Admitting: Oncology

## 2022-07-24 ENCOUNTER — Inpatient Hospital Stay: Payer: 59

## 2022-07-24 VITALS — BP 152/91 | HR 96 | Temp 97.0°F | Wt 133.5 lb

## 2022-07-24 DIAGNOSIS — Z86718 Personal history of other venous thrombosis and embolism: Secondary | ICD-10-CM | POA: Diagnosis not present

## 2022-07-24 DIAGNOSIS — C3491 Malignant neoplasm of unspecified part of right bronchus or lung: Secondary | ICD-10-CM | POA: Diagnosis not present

## 2022-07-24 LAB — MAGNESIUM: Magnesium: 1.3 mg/dL — ABNORMAL LOW (ref 1.7–2.4)

## 2022-07-24 MED ORDER — SODIUM CHLORIDE 0.9% FLUSH
10.0000 mL | Freq: Once | INTRAVENOUS | Status: AC
  Administered 2022-07-24: 10 mL via INTRAVENOUS
  Filled 2022-07-24: qty 10

## 2022-07-24 MED ORDER — HEPARIN SOD (PORK) LOCK FLUSH 100 UNIT/ML IV SOLN
500.0000 [IU] | Freq: Once | INTRAVENOUS | Status: AC
  Administered 2022-07-24: 500 [IU] via INTRAVENOUS
  Filled 2022-07-24: qty 5

## 2022-07-24 MED ORDER — MAGNESIUM CHLORIDE 64 MG PO TBEC: 1.0000 | DELAYED_RELEASE_TABLET | Freq: Every day | ORAL | 1 refills | Status: DC

## 2022-07-24 MED ORDER — MAGNESIUM SULFATE 4 GM/100ML IV SOLN
4.0000 g | Freq: Once | INTRAVENOUS | Status: AC
  Administered 2022-07-24: 4 g via INTRAVENOUS
  Filled 2022-07-24: qty 100

## 2022-07-24 NOTE — Patient Instructions (Signed)
Hypomagnesemia Hypomagnesemia is a condition in which the level of magnesium in the blood is too low. Magnesium is a mineral that is found in many foods. It is used in many different processes in the body. Hypomagnesemia can affect every organ in the body. In severe cases, it can cause life-threatening problems. What are the causes? This condition may be caused by: Not getting enough magnesium in your diet or not having enough healthy foods to eat (malnutrition). Problems with magnesium absorption in the intestines. Dehydration. Excessive use of alcohol. Vomiting. Severe or long-term (chronic) diarrhea. Some medicines, including medicines that make you urinate more often (diuretics). Certain diseases, such as kidney disease, diabetes, celiac disease, and overactive thyroid. What are the signs or symptoms? Symptoms of this condition include: Loss of appetite, nausea, and vomiting. Involuntary shaking or trembling of a body part (tremor). Muscle weakness or tingling in the arms and legs. Sudden tightening of muscles (muscle spasms). Confusion. Psychiatric issues, such as: Depression and irritability. Psychosis. A feeling of fluttering of the heart (palpitations). Seizures. These symptoms are more severe if magnesium levels drop suddenly. How is this diagnosed? This condition may be diagnosed based on: Your symptoms and medical history. A physical exam. Blood and urine tests. How is this treated? Treatment depends on the cause and the severity of the condition. It may be treated by: Taking a magnesium supplement. This can be taken in pill form. If the condition is severe, magnesium is usually given through an IV. Making changes to your diet. You may be directed to eat foods that have a lot of magnesium, such as green leafy vegetables, peas, beans, and nuts. Not drinking alcohol. If you are struggling not to drink, ask your health care provider for help. Follow these instructions at  home: Eating and drinking     Make sure that your diet includes foods with magnesium. Foods that have a lot of magnesium in them include: Green leafy vegetables, such as spinach and broccoli. Beans and peas. Nuts and seeds, such as almonds and sunflower seeds. Whole grains, such as whole grain bread and fortified cereals. Drink fluids that contain salts and minerals (electrolytes), such as sports drinks, when you are active. Do not drink alcohol. General instructions Take over-the-counter and prescription medicines only as told by your health care provider. Take magnesium supplements as directed if your health care provider tells you to take them. Have your magnesium levels monitored as told by your health care provider. Keep all follow-up visits. This is important. Contact a health care provider if: You get worse instead of better. Your symptoms return. Get help right away if: You develop severe muscle weakness. You have trouble breathing. You feel that your heart is racing. These symptoms may represent a serious problem that is an emergency. Do not wait to see if the symptoms will go away. Get medical help right away. Call your local emergency services (911 in the [place] Do not drive yourself to the hospital. Summary Hypomagnesemia is a condition in which the level of magnesium in the blood is too low. Hypomagnesemia can affect every organ in the body. Treatment may include eating more foods that contain magnesium, taking magnesium supplements, and not drinking alcohol. Have your magnesium levels monitored as told by your health care provider. This information is not intended to replace advice given to you by your health care provider. Make sure you discuss any questions you have with your health care provider. Document Revised: 11/23/2020 Document Reviewed: 11/23/2020 Else[employer] Patient Education  [employer].

## 2022-07-24 NOTE — Assessment & Plan Note (Addendum)
Unprovoked left lower extremity DVT, status post thrombolysis/thrombectomy, Nonocclusive PE  Decrease to Eliquis to 2.5 mg twice daily. Rx sent.

## 2022-07-24 NOTE — Progress Notes (Signed)
Hematology/Oncology Progress note [phone] [phone]     Clinic Day:  07/24/2022   Referring physician: Earlie Server, MD ASSESSMENT & PLAN:   Cancer Staging  Squamous cell carcinoma of right lung [facility]) Staging form: Lung, AJCC 8th Edition - Clinical stage from 07/08/2020: Stage IVA (rcT4, cM1a) - Signed by Earlie Server, MD on 07/08/2020   Squamous cell carcinoma of right lung ([place]) 07/16/2020-09/28/2020 carboplatin Taxol and Keytruda x 1  partial response -Keytruda maintenance.-04/22/2021, progression-resumed on carboplatin/Taxol/Keytruda - did not tolerate due to myelosuppression.-->  Taxol held, carboplatin/Keytruda--> CT 07/2020 progression --> 08/03/2021 started docetaxel and ramucirumab.--> Labs are reviewed and discussed with patient. CT scan showed no signs of cancer progression. No chemo needed.  Repeat CT chest in 3 months.   History of thrombosis Unprovoked left lower extremity DVT, status post thrombolysis/thrombectomy, Nonocclusive PE  Decrease to Eliquis to 2.5 mg twice daily. Rx sent.   Hypomagnesemia Recommend slow mag 1 tab daily. Rx sent.  IV mag 4g x 1   Orders Placed This Encounter  Procedures   CT Chest Wo Contrast    Standing Status:   Future    Standing Expiration Date:   07/24/2023    Order Specific Question:   Preferred imaging location?    Answer:   [facility]   CBC with Differential/Platelet    Standing Status:   Future    Standing Expiration Date:   07/24/2023   Comprehensive metabolic panel    Standing Status:   Future    Standing Expiration Date:   07/24/2023    Follow-up 3 months  All questions were answered. The patient knows to call the clinic with any problems, questions or concerns.  Earlie Server, MD, PhD [facility] Hematology Oncology 07/24/2022    CHIEF COMPLAINT:  CC: Follow up for lung cancer treatments.   Current Treatment:  Docetaxel Ramucizumab  HISTORY OF PRESENT ILLNESS:   Oncology History  Squamous  cell carcinoma of right lung ([place])  08/13/2018 Surgery   s/p Bronchoscopy on 06/25/2018. subcarina EBUS FNA was non diagnostic, hypocellular specimen.  08/13/18 Patient underwent right thoracotomy and wedge resection of a right upper lobe mass.  Frozen section was consistent with an inflammatory process.  On the second postop day, preliminary pathology reports high-grade malignancy.  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  08/20/2018 Patient was therefore taken back to the OR and underwent take complete lobectomy  pT1b pN0 cM0 stage I squamous cell lung cancer. Margin is negative.  Recommend observation.     08/13/2018 Initial Diagnosis   Stage I Squamous cell carcinoma of lung, right ([place])   12/03/2018 Progression   12/03/2018 CT chest showed abnormal soft tissue in the right suprahilar region measuring up to 2.5 cm, worrisome for recurrence 01/12/1949 PET Hypermetabolic RIGHT suprahilar mass consistent with lung cancer, recurrence. 12/26/2018 s/p bronchoscopy biopsy. Confirmed local recurrence of squamous cell lung cancer.  medi port placed by Dr.Oaks. 01/20/19 MRI brain w/wo contrast: No evidence of metastatic disease   01/17/2019 - 03/07/2019 Chemotherapy   concurrent chemoradiation on 01/16/2019 Carboplatin AUC of 2 and Taxol 45 mg/m2 weekly finished in 03/05/2019    03/31/2019 Imaging   03/31/19 CT chest showed Right suprahilar lesion has decreased slightly in size in the interval since prior PET-CT.  No new or progressive findings on today's study.   04/10/2019 - 05/20/2020 Chemotherapy   04/10/2019, patient started on durvalumab Q 2 weeks maintenance.    06/14/2020 Progression   06/14/2020 CT chest w contrast showed  vidence of progressive lymphangitic spread of tumor throughout the right lung, with contralateral lung nodules with  right pleural effusion  07/01/22 MRI brain is negative for CNS involvement.  CT abdomen with contrast showed no evidence of abdominal metastatic disease PET scan  was not approved by insurance-peer to peer appeal with Dr.Vipul Bhanderi    07/08/2020 Cancer Staging   Staging form: Lung, AJCC 8th Edition - Clinical stage from 07/08/2020: Stage IVA (rcT4, cM1a) - Signed by Earlie Server, MD on 07/08/2020   07/16/2020 - 09/28/2021 Chemotherapy   Carboplatin + Paclitaxel + Pembrolizumab q21d x 4 cycles      10/15/2020 Imaging   10/15/2020 CT chest abdomen pelvis redemonstrated postoperative and postradiation appearance of the right chest with perihilar consolidation and fibrosis.  Slight interval decrease in size of the pulmonary nodules associated with interlobular septal thickening nearly complete resolution of previously seen left-sided pulmonary nodules.  Minimal irregular residual.  Right pleural effusion improved.  Consistent with treatment response.  No evidence of metastatic disease with the abdomen or pelvis.  None obstructive bilateral nephrolithiasis  - partial response to the treatment-  12/15/2020, CT chest without contrast showed resolution of lung nodules, no evidence of recurrent disease   10/19/2020 -  Chemotherapy   Keytruda monotherapy maintenance   12/30/2020 [facility] Admission   patient was admitted due to unprovoked extensive deep vein thrombosis from calf vein to the common femoral vein on the left.  Patient was started on heparin drip.  Patient underwent thrombolysis and is a colectomy on 12/31/2020.  Anticoagulation was switched to Eliquis at discharge   04/22/2021 Imaging   CT chest with contrast showed none occlusive pulmonary embolism, Interval progression of consolidation airspace opacity in the medial right middle lobe with bandlike opacity and bronchiectasis in the perihilar right lung,-this is compatible with evolving posttreatment changes although recurrent disease anteriorly is not excluded.Interval development of numerous bilateral pulmonary nodules, measuring up to 11 mm in the right lung base.  Concerning for metastatic disease.  Tiny  right pleural effusion.    04/26/2021 -  Chemotherapy   resumed on carboplatin/Taxol/Keytruda. Patient did not tolerate his chemotherapy of carboplatin/Taxol/Keytruda on 04/26/2021. Patient has developed severe anemia status post PRBC transfusion. 05/25/2021, carboplatin AUC 5 with Keytruda 06/23/2021  continued on carboplatin AUC 5 with Keytruda 07/07/2021 carboplatin with [name]      07/15/2021 Progression   07/15/2021 CT chest with contrast showed enlarged right lower lobe thick-walled cavitary lesion in the anterior aspect of the right lower lobe.  Concerning for progression.  Multiple other small pulmonary nodules are also noted elsewhere in the lungs, many of which are stable compared to prior studies.  Several are new or clearly enlarged.  The best example is an enlarging pulmonary nodule in the left upper lobe, 8 x 6 mm comparing to 4 x 3 mm on 04/22/2021   08/03/2021 - 02/14/2022 Chemotherapy   LUNG Docetaxel + Ramucirumab q21d      11/16/2021 Imaging   1. The previously noted thick-walled cavity of concern in the right lower lobe now appears more simple and thin-walled in appearance, presumably a post infectious cavity. There is a new adjacent thick-walled cavity in the right lower lobe which is aggressive in appearance, but given the evolution of the previous lesion, this may also be of infectious etiology. 2. However, there is an enlarging mixed cystic and solid nodule in the left upper lobe which is concerning for metastatic lesion or new primary lesion. This currently measures  11 x 5 x 10 mm. Close attention on follow-up studies is recommended. 3. No definite signs of metastatic disease to the abdomen or pelvis. 4. Extensive colonic diverticulosis most involved in the sigmoid colon where there appear to be several small diverticular abscesses. one of these appears to fistulized into the wall of the urinary bladder, without frank fistulization to the lumen of the urinary bladder at this  time.    02/01/2022 - 02/05/2022 Hospital Admission   Patient was admitted for HCAP, sepsis and COPD exacerbation.  Patient was treated with IV vancomycin, cefepime, prednisone, added doxycycline to cover atypicals given CT findings.  Patient was discharged with Augmentin and doxycycline and completed 7 days of course.   02/01/2022 Imaging   CT angiogram chest with and without contrast 1. Bilateral chronic pulmonary embolus but no acute pulmonary embolus. 2. Probable pulmonary arterial hypertension. 3.  Aortic Atherosclerosis (ICD10-I70.0).  Coronary atherosclerosis. 4. Improved airspace opacity along the peripheral cavitary process in the right lower lobe, currently the thin walled air cyst or cavity measures about 1.4 cm in diameter. 5. Increased peripheral tree-in-bud reticulonodular opacities in both lower lobes in the lingula, compatible with atypical infection. 6. Airway thickening and airway plugging particularly in the right lower lobe. 7. Mosaic attenuation in the lungs, stable but nonspecific.8.  Emphysema (ICD10-J43.9). 9. Stable radiation therapy related findings in the right perihilar regions. Right upper lobectomy.   02/10/2022 - 02/12/2022 Hospital Admission   Patient was admitted due to acute hypoxic respiratory failure CT findings highly suspicious for MAC Patient had AFB sputum x 3 and has follow-up appointment with ID to review results.  Patient was discharged on 2 L of nasal cannula oxygen   02/10/2022 Imaging   CT chest without contrast 1. Cavitary lesions of the anterior right lower lobe demonstrate increased wall thickening and there is a new irregular solid pulmonary nodule of the left upper lobe. 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 2. Stable right paramediastinal postradiation change. 3. Similar bilateral mosaic  attenuation, findings can be seen in setting of small airways disease or pulmonary hypertension.4. Aortic Atherosclerosis (ICD10-I70.0) and Emphysema (ICD10-J43.9).   07/17/2022 Imaging   CT chest abdomen pelvis w contrast 1. Unchanged post treatment appearance of the right chest, with dense, perihilar fibrotic bronchiectasis, consolidation, and architectural distortion. 2. Unchanged multilobulated cystic lesion of the anterior right lower lobe, with some adjacent irregular consolidation. Findings are most consistent with sequelae of prior infection. 3. Irregular nodules previously described in the left upper lobe are almost completely resolved, consistent with resolving infection or inflammation. 4. Background of very fine centrilobular and ground-glass nodularity, most concentrated in the lung apices, most commonly seen in smoking-related respiratory bronchiolitis. 5. No evidence of metastatic disease in the abdomen or pelvis.Aortic Atherosclerosis     03/06/2022, sputum collected shows Pseudomonas  aeruginosa which explains his findings on the CT scan.  There were 2 types of pseudomonas colonies and they both are resistant o ciprofloxacin. patient was referred to infectious disease and was seen by Dr. Steva Ready on 03/09/2022.  Patient was treated with meropenem and completed his course on 04/04/2022. Chemotherapy has been on hold   Samuel Choi is a 57 y.o. male who has above history reviewed by me today presents for follow up visit for metastatic squamous cell carcinoma of lung. He reports feeling well. No new complaints.  Appetite  is fair.   REVIEW OF SYSTEMS:  Review of Systems  Constitutional:  Positive for fatigue. Negative for appetite change, chills, fever and unexpected weight change.  HENT:   Negative for hearing loss and voice change.   Eyes:  Negative for eye problems and icterus.  Respiratory:  Positive for shortness of breath. Negative for chest tightness and  cough.   Cardiovascular:  Negative for chest pain and leg swelling.  Gastrointestinal:  Negative for abdominal distention, abdominal pain and blood in stool.  Endocrine: Negative for hot flashes.  Genitourinary:  Negative for difficulty urinating, dysuria and frequency.   Musculoskeletal:  Negative for arthralgias.  Skin:  Negative for itching and rash.  Neurological:  Negative for extremity weakness, light-headedness and numbness.  Hematological:  Negative for adenopathy. Does not bruise/bleed easily.  Psychiatric/Behavioral:  Negative for confusion.       VITALS:  Blood pressure (!) 152/91, pulse 96, temperature (!) 97 F (36.1 C), temperature source Tympanic, weight 133 lb 8 oz (60.6 kg), SpO2 96 %.  Wt Readings from Last 3 Encounters:  07/24/22 133 lb 8 oz (60.6 kg)  05/17/22 132 lb 3.2 oz (60 kg)  04/07/22 134 lb 8 oz (61 kg)    Body mass index is 17.61 kg/m.  Performance status (ECOG): 1 - Symptomatic but completely ambulatory  PHYSICAL EXAM:  Physical Exam HENT:     Head: Normocephalic and atraumatic.  Eyes:     General: No scleral icterus. Cardiovascular:     Rate and Rhythm: Normal rate.  Pulmonary:     Effort: Pulmonary effort is normal.     Breath sounds: No wheezing or rales.     Comments: Decreased breath sound bilaterally Patient is on nasal cannula oxygen. Abdominal:     General: There is no distension.     Palpations: Abdomen is soft.     Tenderness: There is no abdominal tenderness.  Musculoskeletal:        General: No swelling.     Cervical back: Normal range of motion.  Skin:    General: Skin is warm and dry.  Neurological:     General: No focal deficit present.     Mental Status: He is alert. Mental status is at baseline.  Psychiatric:        Mood and Affect: Mood normal.     LABS:      Latest Ref Rng & Units 07/13/2022   11:08 AM 02/15/2022    8:52 AM 02/12/2022    5:00 AM  CBC  WBC 4.0 - 10.5 K/uL 13.1  26.5  37.0   Hemoglobin 13.0 -  17.0 g/dL 13.9  8.5  8.3   Hematocrit 39.0 - 52.0 % 40.6  24.6  24.1   Platelets 150 - 400 K/uL 175  108  77       Latest Ref Rng & Units 07/13/2022   11:08 AM 02/15/2022    8:52 AM 02/11/2022   11:55 AM  CMP  Glucose 70 - 99 mg/dL 93  125  117   BUN 6 - 20 mg/dL _0 Creatinine 0.61 - 1.24 mg/dL 1.02  0.88  0.90   Sodium 135 - 145 mmol/L 139  139  139   Potassium 3.5 - 5.1 mmol/L 3.5  4.0  3.5   Chloride 98 - 111 mmol/L 101  102  106   CO2 22 - 32 mmol/L _1 Calcium 8.9 - 10.3 mg/dL 9.1  8.5  8.4   Total Protein 6.5 - 8.1 g/dL 7.9  6.1    Total Bilirubin 0.3 - 1.2 mg/dL 0.6  0.4    Alkaline Phos 38 - 126 U/L 103  101    AST 15 - 41 U/L 23  21    ALT 0 - 44 U/L 12  19       HISTORY:   Past Medical History:  Diagnosis Date   Alcohol abuse    usually drinks 2-3 drinks per day   Atherosclerosis 06/2018   Cancer associated pain    Chronic sinusitis    Dehydration 02/07/2019   Emphysema of lung ([place]) 06/2018   patient unaware of this.   Hip fracture ([place]) 05/2018   no surgery   History of kidney stones 05/2018   per xray, bilateral nephrolitiasis   Hypertension    Squamous cell carcinoma of lung, right ([place]) 06/2018    Past Surgical History:  Procedure Laterality Date   BRAIN SURGERY  10/2017   nasal/sinus endoscopy. mass benign   ELECTROMAGNETIC NAVIGATION BROCHOSCOPY Right 06/25/2018   Procedure: ELECTROMAGNETIC NAVIGATION BRONCHOSCOPY;  Surgeon: [name], MD;  Location: [facility];  Service: Cardiopulmonary;  Laterality: Right;   ENDOBRONCHIAL ULTRASOUND Right 06/25/2018   Procedure: ENDOBRONCHIAL ULTRASOUND;  Surgeon: [name], MD;  Location: [facility];  Service: Cardiopulmonary;  Laterality: Right;   ENDOBRONCHIAL ULTRASOUND Right 12/26/2018   Procedure: ENDOBRONCHIAL ULTRASOUND RIGHT;  Surgeon: [name], MD;  Location: [facility];  Service: Cardiopulmonary;  Laterality: Right;   FLEXIBLE BRONCHOSCOPY N/A 08/20/2018   Procedure: FLEXIBLE  BRONCHOSCOPY PREOP;  Surgeon: [name], MD;  Location: [facility];  Service: Thoracic;  Laterality: N/A;   IR CV LINE INJECTION  04/04/2019   NASAL SINUS SURGERY  10/2017   At [facility], frontal sinusotomy, ethmoidectomy, resection anterior cranial fossa neoplasm, turbinate resection   PERIPHERAL VASCULAR THROMBECTOMY Left 12/31/2020   Procedure: PERIPHERAL VASCULAR THROMBECTOMY;  Surgeon: Algernon Huxley, MD;  Location: [facility];  Service: Cardiovascular;  Laterality: Left;   PORTACATH PLACEMENT Left 01/15/2019   Procedure: INSERTION PORT-A-CATH;  Surgeon: [name], MD;  Location: [facility];  Service: General;  Laterality: Left;   THORACOTOMY Right 08/13/2018   Procedure: PREOP BROCHOSCOPY WITH RIGHT THORACOTOMY AND RUL RESECTION;  Surgeon: [name], MD;  Location: [facility];  Service: General;  Laterality: Right;   THORACOTOMY Right 08/20/2018   Procedure: THORACOTOMY MAJOR RIGHT UPPER LOBE LOBECTOMY;  Surgeon: [name], MD;  Location: [facility];  Service: Thoracic;  Laterality: Right;   TOE SURGERY Left    pin in left toe    Family History  Problem Relation Age of Onset   Breast cancer Mother    Diabetes Mother    Lung cancer Father    Hypertension Father     Social History:  reports that he quit smoking about 3 years ago. His smoking use included cigarettes. He has a 7.50 pack-year smoking history. He has never used smokeless tobacco. He reports current alcohol use of about 3.0 standard drinks of alcohol per week. He reports that he does not use drugs.  Allergies: No Known Allergies  Current Medications: Current Outpatient Medications  Medication Sig Dispense Refill   albuterol (PROVENTIL) (2.5 MG/3ML) 0.083% nebulizer solution Take 3 mLs (2.5 mg total) by nebulization every 6 (six) hours as needed for wheezing or shortness of breath. 120 mL 5   albuterol (VENTOLIN HFA) 108 (90 Base) MCG/ACT inhaler INHALE 2 PUFFS BY MOUTH EVERY 6 HOURS  AS NEEDED FOR WHEEZING OR  SHORTNESS OF BREATH 8.5 g 5   apixaban (ELIQUIS) 5 MG TABS tablet Take 1 tablet (5 mg total) by mouth 2 (two) times daily. 60 tablet 2   benzonatate (TESSALON) 100 MG capsule Take 1 capsule (100 mg total) by mouth 3 (three) times daily as needed for cough. 20 capsule 2   budesonide-formoterol (SYMBICORT) 160-4.5 MCG/ACT inhaler Inhale 2 puffs into the lungs 2 (two) times daily. 10.2 g 11   cyanocobalamin (VITAMIN B12) 1000 MCG tablet Take 1 tablet (1,000 mcg total) by mouth daily. 90 tablet 1   diltiazem (CARDIZEM CD) 120 MG 24 hr capsule Take 120 mg by mouth daily.     feeding supplement (ENSURE ENLIVE / ENSURE PLUS) LIQD Take 237 mLs by mouth 3 (three) times daily between meals. 237 mL 12   HYDROcodone-acetaminophen (NORCO/VICODIN) 5-325 MG tablet Take 1 tablet by mouth every 8 (eight) hours as needed for severe pain. 30 tablet 0   latanoprost (XALATAN) 0.005 % ophthalmic solution Place 1 drop into both eyes at bedtime.     loratadine (CLARITIN) 10 MG tablet Take 1 tablet (10 mg total) by mouth daily. 30 tablet 3   magnesium chloride (SLOW-MAG) 64 MG TBEC SR tablet Take 1 tablet (64 mg total) by mouth daily. 90 tablet 1   methocarbamol (ROBAXIN) 500 MG tablet Take 1 tablet (500 mg total) by mouth at bedtime. 30 tablet 0   metoprolol succinate (TOPROL-XL) 25 MG 24 hr tablet Take 1 tablet (25 mg total) by mouth daily. 30 tablet 0   Multiple Vitamin (MULTIVITAMIN WITH MINERALS) TABS tablet Take 1 tablet by mouth daily.     ondansetron (ZOFRAN) 8 MG tablet Take 1 tablet (8 mg total) by mouth every 8 (eight) hours as needed for refractory nausea / vomiting. Start on day 3 after chemo. 90 tablet 1   pantoprazole (PROTONIX) 20 MG tablet TAKE 1 TABLET(20 MG) BY MOUTH DAILY 60 tablet 2   SPIRIVA HANDIHALER 18 MCG inhalation capsule INHALE 1 CAPSULE VIA HANDIHALER ONCE DAILY AT THE SAME TIME EVERY DAY 30 capsule 0   No current facility-administered medications for this visit.   Facility-Administered  Medications Ordered in Other Visits  Medication Dose Route Frequency Provider Last Rate Last Admin   dexamethasone (DECADRON) 10 MG/ML injection            heparin lock flush 100 unit/mL  500 Units Intravenous Once Earlie Server, MD       heparin lock flush 100 unit/mL  500 Units Intravenous Once Earlie Server, MD       sodium chloride flush (NS) 0.9 % injection 10 mL  10 mL Intravenous PRN Earlie Server, MD   10 mL at 04/04/19 6270   sodium chloride flush (NS) 0.9 % injection 10 mL  10 mL Intravenous PRN Earlie Server, MD   10 mL at 07/26/20 1308   sodium chloride flush (NS) 0.9 % injection 10 mL  10 mL Intravenous PRN Earlie Server, MD   10 mL at 07/28/21 0828       IMAGE STUDIES:  CT CHEST ABDOMEN PELVIS W CONTRAST  Result Date: 07/17/2022 CLINICAL DATA:  Right squamous cell lung cancer restaging, status post wedge resection * Tracking Code: BO * EXAM: CT CHEST, ABDOMEN, AND PELVIS WITH CONTRAST TECHNIQUE: Multidetector CT imaging of the chest, abdomen and pelvis was performed following the standard protocol during bolus administration of intravenous contrast. RADIATION DOSE REDUCTION: This exam was performed according to the departmental dose-optimization  program which includes automated exposure control, adjustment of the mA and/or kV according to patient size and/or use of iterative reconstruction technique. CONTRAST:  51m OMNIPAQUE IOHEXOL 300 MG/ML  SOLN COMPARISON:  06/21/2022 FINDINGS: CT CHEST FINDINGS Cardiovascular: Left chest port catheter. Aortic atherosclerosis. Normal heart size. No pericardial effusion. Mediastinum/Nodes: No enlarged mediastinal, hilar, or axillary lymph nodes. Thyroid gland, trachea, and esophagus demonstrate no significant findings. Lungs/Pleura: Unchanged post treatment appearance of the right chest, with dense, perihilar fibrotic bronchiectasis, consolidation, and architectural distortion (series 3, image 71). Unchanged multilobulated cystic lesion of the anterior right lower lobe  (series 3, image 93), with some adjacent irregular consolidation (series 3, image 83). Unchanged, small, chronic loculated right pleural effusion. Irregular nodules previously described in the left upper lobe are almost completely resolved (series 3, image 48). Background of very fine centrilobular and ground-glass nodularity, most concentrated in the lung apices. Musculoskeletal: No chest wall abnormality. No acute osseous findings. CT ABDOMEN PELVIS FINDINGS Hepatobiliary: No solid liver abnormality is seen. No gallstones, gallbladder wall thickening, or biliary dilatation. Pancreas: Unremarkable. No pancreatic ductal dilatation or surrounding inflammatory changes. Spleen: Normal in size without significant abnormality. Adrenals/Urinary Tract: Adrenal glands are unremarkable. Kidneys are normal, without renal calculi, solid lesion, or hydronephrosis. Bladder is unremarkable. Stomach/Bowel: Stomach is within normal limits. Appendix appears normal. No evidence of bowel wall thickening, distention, or inflammatory changes. Descending and sigmoid diverticulosis. Vascular/Lymphatic: Aortic atherosclerosis. No enlarged abdominal or pelvic lymph nodes. Reproductive: No mass or other abnormality. Other: No abdominal wall hernia or abnormality. No ascites. Musculoskeletal: No acute osseous findings. IMPRESSION: 1. Unchanged post treatment appearance of the right chest, with dense, perihilar fibrotic bronchiectasis, consolidation, and architectural distortion. 2. Unchanged multilobulated cystic lesion of the anterior right lower lobe, with some adjacent irregular consolidation. Findings are most consistent with sequelae of prior infection. 3. Irregular nodules previously described in the left upper lobe are almost completely resolved, consistent with resolving infection or inflammation. 4. Background of very fine centrilobular and ground-glass nodularity, most concentrated in the lung apices, most commonly seen in  smoking-related respiratory bronchiolitis. 5. No evidence of metastatic disease in the abdomen or pelvis. Aortic Atherosclerosis (ICD10-I70.0). Electronically Signed   By: ADelanna AhmadiM.D.   On: 07/17/2022 15:40   CT CHEST WO CONTRAST  Result Date: 06/21/2022 CLINICAL DATA:  Follow up necrotizing pneumonia. History of right lung squamous cell carcinoma with wedge resection and radiation. * Tracking Code: BO * EXAM: CT CHEST WITHOUT CONTRAST TECHNIQUE: Multidetector CT imaging of the chest was performed following the standard protocol without IV contrast. RADIATION DOSE REDUCTION: This exam was performed according to the departmental dose-optimization program which includes automated exposure control, adjustment of the mA and/or kV according to patient size and/or use of iterative reconstruction technique. COMPARISON:  Chest CT 04/11/2022, 02/22/2022 and 02/10/2022. FINDINGS: Cardiovascular: Left subclavian Port-A-Cath extends to the mid SVC. There is atherosclerosis of the aorta, great vessels and coronary arteries. Mild central enlargement of the pulmonary arteries consistent with pulmonary arterial hypertension. Previously demonstrated pericardial effusion has mildly decreased in volume. The heart size is normal. Mediastinum/Nodes: There are no enlarged mediastinal, hilar or axillary lymph nodes.Hilar assessment is limited by the lack of intravenous contrast, although the hilar contours appear unchanged. The thyroid gland, trachea and esophagus demonstrate no significant findings. Lungs/Pleura: Previously demonstrated small right pleural effusion has nearly resolved. There is a small residual loculated component posteriorly which is associated with mild adjacent pleural thickening. No left pleural effusion or pneumothorax. Underlying moderate centrilobular  emphysema with evidence of previous right upper lobe resection. Generally stable bandlike opacities radiating from the right hilum consistent with  radiation fibrosis. There is associated central architectural distortion and bronchiectasis. Further improvement in the previously demonstrated cavitary lesions anteriorly in the right lower lobe and surrounding airspace disease. The cavitary components currently measure up to 3.8 cm on sagittal image 34/7 and 1.2 cm on image 35/7, both decreased in size from previous studies and with less wall thickening. No new airspace opacities or suspicious pulmonary nodules. Upper abdomen: Mild contour irregularity of the liver suspicious for cirrhosis. No adrenal mass. Musculoskeletal/Chest wall: There is no chest wall mass or suspicious osseous finding. Chronic thoracotomy changes on the right. IMPRESSION: 1. Further improvement in the previously demonstrated cavitary lesions anteriorly in the right lower lobe and surrounding airspace disease, consistent with resolving cavitary pneumonia. 2. Near resolution of previously demonstrated small right pleural effusion with small residual loculated component posteriorly. 3. 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 No evidence of local recurrence or metastatic disease. 4. Decreased volume of pericardial effusion. 5. Aortic Atherosclerosis (ICD10-I70.0) and Emphysema (ICD10-J43.9). Electronically Signed   By: Richardean Sale M.D.   On: 06/21/2022 16:34

## 2022-07-24 NOTE — Assessment & Plan Note (Signed)
Recommend slow mag 1 tab daily. Rx sent.  IV mag 4g x 1

## 2022-07-24 NOTE — Assessment & Plan Note (Addendum)
07/16/2020-09/28/2020 carboplatin Taxol and Keytruda x 1  partial response -Keytruda maintenance.-04/22/2021, progression-resumed on carboplatin/Taxol/Keytruda - did not tolerate due to myelosuppression.-->  Taxol held, carboplatin/Keytruda--> CT 07/2020 progression --> 08/03/2021 started docetaxel and ramucirumab.--> Labs are reviewed and discussed with patient. CT scan showed no signs of cancer progression. No chemo needed.  Repeat CT chest in 3 months.

## 2022-07-26 ENCOUNTER — Telehealth: Payer: Self-pay | Admitting: *Deleted

## 2022-07-26 NOTE — Telephone Encounter (Signed)
Patient called and said that he keeps getting calls with messages that his Metoprolol needs doctor authorization. It has not been refill since 06/27/22 for 30 tabs with no refill. Are we refilling this for him?

## 2022-07-27 ENCOUNTER — Encounter: Payer: Self-pay | Admitting: Oncology

## 2022-07-27 NOTE — Telephone Encounter (Signed)
Patient informed to contact his PCP for this refill

## 2022-08-10 ENCOUNTER — Encounter: Payer: Self-pay | Admitting: Oncology

## 2022-08-17 ENCOUNTER — Ambulatory Visit: Payer: Medicare Other | Admitting: Pulmonary Disease

## 2022-08-17 ENCOUNTER — Encounter: Payer: Self-pay | Admitting: Pulmonary Disease

## 2022-08-17 VITALS — BP 142/78 | HR 101 | Temp 98.3°F | Ht 73.0 in | Wt 134.0 lb

## 2022-08-17 DIAGNOSIS — C3491 Malignant neoplasm of unspecified part of right bronchus or lung: Secondary | ICD-10-CM

## 2022-08-17 DIAGNOSIS — J984 Other disorders of lung: Secondary | ICD-10-CM

## 2022-08-17 DIAGNOSIS — J449 Chronic obstructive pulmonary disease, unspecified: Secondary | ICD-10-CM

## 2022-08-17 DIAGNOSIS — I272 Pulmonary hypertension, unspecified: Secondary | ICD-10-CM

## 2022-08-17 DIAGNOSIS — J9611 Chronic respiratory failure with hypoxia: Secondary | ICD-10-CM

## 2022-08-17 NOTE — Patient Instructions (Signed)
We have sent another request for your oxygen to [employer].  You your Symbicort and your Spiriva.  We will see on follow-up in 3 to 4 months time call sooner should any new problems arise.

## 2022-08-17 NOTE — Progress Notes (Signed)
Subjective:    Patient ID: Samuel Choi, male    DOB: 1966/03/31, 57 y.o.   MRN: [id number] Patient Care Team: Earlie Server, MD as PCP - General (Oncology) Earlie Server, MD as Consulting Physician (Hematology and Oncology) Tsosie Billing, MD as Consulting Physician (Infectious Diseases) Borders, Kirt Boys, NP as Nurse Practitioner (Hospice and Palliative Medicine) Tyler Pita, MD as Consulting Physician (Pulmonary Disease)  Chief Complaint  Patient presents with   Follow-up    Breathing is doing good. No SOB, wheezing or cough.    HPI [name] is a 57 year old former smoker, with a history as noted below, who presents for follow-up on the issue of abnormal imaging of the lung in the setting of squamous cell carcinoma of the right lung.  He was noted to have cavitary lesions in the lung on the right.  He had been worked up while during a hospitalization to [facility] but only sputum for MTB probe was obtained.  Patient was seen on 01 March 2022 at the clinic, and at that time he was still producing quite a bit of sputum. His cavitary lesions on the right lower lobe appeared to progress significantly and rapidly.  This was consistent with an infectious process.  Microbiology of the sputum collected on 03 March 2022 showed that the patient had 2 different strains of Pseudomonas that were quite resistant.  He was referred to the infectious disease clinic and Dr. Delaine Lame saw the patient and started him on IV meropenem.  After completion of meropenem, a follow-up chest CT on 11 April 2022 showed significant improvement on the cavitary process.  His prior visit here was on 17 May 2022 at that time he had noticed dyspnea on exertion and an ambulatory oximetry showed that he had oxygen desaturations to 86% during ambulation.  He qualified for supplemental oxygen.  The order was placed to [facility], however the patient never received portable tanks only concentrator.  He presents fully ambulatory today,  he does note dyspnea on exertion.  Cough has been rare and nonproductive since he completed antibiotics.  No hemoptysis. He states he is compliant with Symbicort 160/4.5, 2 puffs twice a day and Spiriva daily.  He has not had issues with diarrhea (recall that he had an issue with C. difficile previously).  No orthopnea or paroxysmal nocturnal dyspnea.  No lower extremity edema and no calf tenderness.  He notes his appetite and food intake continue to improve.  We reviewed the CT chest performed 17 July 2022, the previous changes noted with his Pseudomonas infection have clear now leaving changes consistent with sequela of this infection.  There is posttreatment changes on the right chest without significant change.   Overall he feels well but continues to have dyspnea on exertion likely related to oxygen desaturations as noted below.  We will have to determine why [facility] did not provide the requested equipment at the time of qualification in November.  Overall he is feeling much better and actually looks less frail and more engaging today.   Review of Systems A 10 point review of systems was performed and it is as noted above otherwise negative.  Patient Active Problem List   Diagnosis Date Noted   Necrotic pneumonia ([place]) 04/07/2022   Cavitary lesion of lung 02/11/2022   Acute respiratory failure ([place]) 02/10/2022   Lactic acidosis 02/10/2022   SVT (supraventricular tachycardia) 02/02/2022   Sepsis ([place]) 02/01/2022   Hyponatremia 02/01/2022   Chronic diastolic CHF (congestive heart failure) ([place]) 02/01/2022  Macrocytic anemia 02/01/2022   Elevated serum creatinine 01/25/2022   Diverticulosis of colon 11/18/2021   History of thrombosis 11/18/2021   Weight loss 11/09/2021   C. difficile colitis 10/19/2021   Hyperkalemia 07/28/2021   Severe protein-calorie malnutrition ([place]) 06/08/2021   Antineoplastic chemotherapy induced anemia 06/08/2021   Pulmonary embolus ([place]) 06/08/2021    Chemotherapy induced nausea and vomiting 05/17/2021   Symptomatic anemia 05/17/2021   Wide-complex tachycardia    Chemotherapy induced neutropenia ([place]) 11/09/2020   COPD (chronic obstructive pulmonary disease) ([place]) 04/22/2020   Sinus tachycardia 12/18/2019   Hypomagnesemia 12/10/2019   Encounter for antineoplastic immunotherapy 11/24/2019   Ex-smoker 07/23/2019   Port-A-Cath in place 04/04/2019   Thrombocytopenia ([place]) 04/04/2019   Folate deficiency 04/04/2019   Hypokalemia 04/04/2019   B12 deficiency 04/04/2019   Alcohol abuse 04/04/2019   Dehydration 02/07/2019   Encounter for antineoplastic chemotherapy 01/31/2019   Squamous cell carcinoma of right lung ([place]) 09/03/2018   Goals of care, counseling/discussion 09/03/2018   Malnutrition of moderate degree 08/23/2018   Emphysema of lung ([place]) 06/2018   Allergic rhinitis 06/2018   H/O nasal polyp 02/20/2018   Acute sinusitis 10/10/2017   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/10/2017   Chronic rhinitis 10/10/2017   Mass of sinus 10/10/2017   Essential hypertension 09/27/2017   Tobacco abuse 09/27/2017   Social History   Tobacco Use   Smoking status: Former    Packs/day: 0.50    Years: 15.00    Total pack years: 7.50    Types: Cigarettes    Quit date: 08/2018    Years since quitting: 4.0   Smokeless tobacco: Never  Substance Use Topics   Alcohol use: Yes    Alcohol/week: 3.0 standard drinks of alcohol    Types: 3 Cans of beer per week    Comment: usually 2 drinks per week, per patient   No Known Allergies  Current Meds  Medication Sig   albuterol (PROVENTIL) (2.5 MG/3ML) 0.083% nebulizer solution Take 3 mLs (2.5 mg total) by nebulization every 6 (six) hours as needed for wheezing or shortness of breath.   albuterol (VENTOLIN HFA) 108 (90 Base) MCG/ACT inhaler INHALE 2 PUFFS BY MOUTH EVERY 6 HOURS AS NEEDED FOR WHEEZING OR SHORTNESS OF BREATH   apixaban (ELIQUIS) 5 MG TABS tablet Take 1 tablet (5 mg total) by mouth 2 (two) times  daily.   benzonatate (TESSALON) 100 MG capsule Take 1 capsule (100 mg total) by mouth 3 (three) times daily as needed for cough.   budesonide-formoterol (SYMBICORT) 160-4.5 MCG/ACT inhaler Inhale 2 puffs into the lungs 2 (two) times daily.   cyanocobalamin (VITAMIN B12) 1000 MCG tablet Take 1 tablet (1,000 mcg total) by mouth daily.   diltiazem (CARDIZEM CD) 120 MG 24 hr capsule Take 120 mg by mouth daily.   feeding supplement (ENSURE ENLIVE / ENSURE PLUS) LIQD Take 237 mLs by mouth 3 (three) times daily between meals.   HYDROcodone-acetaminophen (NORCO/VICODIN) 5-325 MG tablet Take 1 tablet by mouth every 8 (eight) hours as needed for severe pain.   latanoprost (XALATAN) 0.005 % ophthalmic solution Place 1 drop into both eyes at bedtime.   loratadine (CLARITIN) 10 MG tablet Take 1 tablet (10 mg total) by mouth daily.   magnesium chloride (SLOW-MAG) 64 MG TBEC SR tablet Take 1 tablet (64 mg total) by mouth daily.   methocarbamol (ROBAXIN) 500 MG tablet Take 1 tablet (500 mg total) by mouth at bedtime.   metoprolol succinate (TOPROL-XL) 25 MG 24 hr tablet Take 1 tablet (  25 mg total) by mouth daily.   Multiple Vitamin (MULTIVITAMIN WITH MINERALS) TABS tablet Take 1 tablet by mouth daily.   ondansetron (ZOFRAN) 8 MG tablet Take 1 tablet (8 mg total) by mouth every 8 (eight) hours as needed for refractory nausea / vomiting. Start on day 3 after chemo.   pantoprazole (PROTONIX) 20 MG tablet TAKE 1 TABLET(20 MG) BY MOUTH DAILY   SPIRIVA HANDIHALER 18 MCG inhalation capsule INHALE 1 CAPSULE VIA HANDIHALER ONCE DAILY AT THE SAME TIME EVERY DAY   Immunization History  Administered Date(s) Administered   Influenza Inj Mdck Quad Pf 03/28/2019   Influenza, High Dose Seasonal PF 03/28/2019   Influenza-Unspecified 03/31/2022   Moderna Sars-Covid-2 Vaccination 11/14/2019   Pneumococcal Conjugate-13 02/17/2021   Pneumococcal Polysaccharide-23 11/19/2018   Tdap 12/31/2017   Zoster Recombinat (Shingrix)  03/28/2019, 05/01/2019, 06/09/2019       Objective:   Physical Exam BP (!) 142/78 (BP Location: Left Arm, Cuff Size: Normal)   Pulse (!) 101   Temp 98.3 F (36.8 C)   Ht 6\' 1"  (1.854 m)   Wt 134 lb (60.8 kg)   SpO2 (!) 89%   BMI 17.68 kg/m   SpO2: (!) 89 % O2 Device: None (Room air)  Repeat O2 sat 92%  GENERAL: Thin, gentleman, presents fully ambulatory.  No acute distress. HEAD: Normocephalic, atraumatic.  Alopecia due to chemotherapy. EYES: Pupils equal, round, reactive to light.  No scleral icterus.  Disconjugate gaze (exotropia of right eye). MOUTH: Poor dentition, oral mucosa moist.  No thrush. NECK: Supple. No thyromegaly. Trachea midline. No JVD.  No adenopathy. PULMONARY: Good air entry bilaterally.  There are scattered wheezes throughout. CARDIOVASCULAR: S1 and S2.  Tachycardic with regular rhythm.  No rubs, murmurs or gallops heard. ABDOMEN: Scaphoid, otherwise benign. MUSCULOSKELETAL: No joint deformity, no clubbing, no edema.  Significant sarcopenia. NEUROLOGIC: Fully ambulatory today, occasional disconjugate gaze.  Otherwise no focality. SKIN: Intact,warm,dry. PSYCH: Mood and behavior normal.  Ambulatory oximetry was performed today: At rest oxygen saturation 92% on room air, heart rate 140 bpm.  During ambulation maximal heart rate 120 bpm oxygen saturation nadir 86%, patient required 4 L of oxygen to maintain oxygen saturations between 90 to 94% with ambulation.  Representative image from CT performed 17 July 2022 which was independently reviewed, showing cystic/fibrotic changes after treatment for Pseudomonas necrotizing pneumonia:     Assessment & Plan:     ICD-10-CM   1. Stage 2 moderate COPD by GOLD classification ([place])  J44.9 AMB REFERRAL FOR DME   Continue Symbicort and Spiriva Continue as needed albuterol    2. Chronic respiratory failure with hypoxia (HCC)  J96.11 AMB REFERRAL FOR DME   Patient qualifies for oxygen at 4 L/min with  ambulation Will send another request to [employer]    3. Severe pulmonary hypertension (HCC)  I27.20 AMB REFERRAL FOR DME   This issue adds complexity to his management Adds to his issues with hypoxemia    4. 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 Cavitary lesion of lung  J98.4    This was due to Pseudomonas infection Treated, residual changes noted on CT     Orders Placed This Encounter  Procedures   AMB REFERRAL FOR DME    Referral Priority:   Routine    Referral Type:   Durable Medical Equipment  Purchase    Number of Visits Requested:   1   Overall Ragnar is doing well.  We will see him in follow-up in 3 to 4 months time he is to call sooner should any new problems arise.   Renold Don, MD [facility]    *This note was dictated using voice recognition software/Dragon.  Despite best efforts to proofread, errors can occur which can change the meaning. Any transcriptional errors that result from this process are unintentional and may not be fully corrected at the time of dictation.

## 2022-08-22 ENCOUNTER — Encounter: Payer: Self-pay | Admitting: Oncology

## 2022-08-22 ENCOUNTER — Telehealth: Payer: Self-pay | Admitting: Pulmonary Disease

## 2022-08-23 NOTE — Telephone Encounter (Signed)
I just spoke with Samuel Choi with [employer]. Lenna Sciara stated that the patient has a 02 concentrator since 02/2022. [employer] has left a message and patient hasn't called them back. The RT needs to scheduled POC Evaluation. Samuel Choi was going to ask RT to reach out to the patient again today

## 2022-08-30 ENCOUNTER — Encounter: Payer: Self-pay | Admitting: Oncology

## 2022-09-15 NOTE — Telephone Encounter (Signed)
I just spoke with [name] with [employer] and the patient came into their office and did the POC Eval. POC delivered 3/4 pulse dose

## 2022-09-17 ENCOUNTER — Encounter: Payer: Self-pay | Admitting: Oncology

## 2022-09-20 ENCOUNTER — Encounter: Payer: Self-pay | Admitting: Oncology

## 2022-10-04 ENCOUNTER — Encounter: Payer: Self-pay | Admitting: Oncology

## 2022-10-06 ENCOUNTER — Other Ambulatory Visit: Payer: Self-pay | Admitting: *Deleted

## 2022-10-07 ENCOUNTER — Encounter: Payer: Self-pay | Admitting: Oncology

## 2022-10-07 MED ORDER — METOPROLOL SUCCINATE ER 25 MG PO TB24: 25.0000 mg | ORAL_TABLET | Freq: Every day | ORAL | 0 refills | Status: DC

## 2022-10-10 ENCOUNTER — Encounter: Payer: Self-pay | Admitting: Oncology

## 2022-10-16 ENCOUNTER — Other Ambulatory Visit: Payer: Self-pay | Admitting: Oncology

## 2022-10-17 ENCOUNTER — Other Ambulatory Visit: Payer: Medicare Other

## 2022-10-17 DIAGNOSIS — R972 Elevated prostate specific antigen [PSA]: Secondary | ICD-10-CM

## 2022-10-18 ENCOUNTER — Other Ambulatory Visit: Payer: Self-pay | Admitting: Oncology

## 2022-10-18 ENCOUNTER — Encounter: Payer: Self-pay | Admitting: Oncology

## 2022-10-19 ENCOUNTER — Ambulatory Visit
Admission: RE | Admit: 2022-10-19 | Discharge: 2022-10-19 | Disposition: A | Discharge status: Home / Self Care | Payer: Medicare Other | Source: Ambulatory Visit | Attending: Oncology | Admitting: Oncology

## 2022-10-19 DIAGNOSIS — C3491 Malignant neoplasm of unspecified part of right bronchus or lung: Secondary | ICD-10-CM | POA: Diagnosis present

## 2022-10-19 LAB — PSA: Prostate Specific Ag, Serum: 10.4 ng/mL — ABNORMAL HIGH (ref 0.0–4.0)

## 2022-10-25 ENCOUNTER — Ambulatory Visit (INDEPENDENT_AMBULATORY_CARE_PROVIDER_SITE_OTHER): Payer: Medicare Other | Admitting: Urology

## 2022-10-25 VITALS — BP 114/75 | HR 90 | Ht 73.0 in | Wt 132.0 lb

## 2022-10-25 DIAGNOSIS — R972 Elevated prostate specific antigen [PSA]: Secondary | ICD-10-CM | POA: Diagnosis not present

## 2022-10-25 NOTE — Progress Notes (Signed)
I, Amy L Pierron,acting as a scribe for Samuel Scotland, MD.,have documented all relevant documentation on the behalf of Samuel Scotland, MD,as directed by  Samuel Scotland, MD while in the presence of Samuel Scotland, MD.  10/25/2022 2:21 PM   Samuel Choi [birth year] [id number]  Referring provider: Barbette Reichmann, MD [address] [facility] [place],  [place] [address]  Chief Complaint  Patient presents with   Elevated PSA    HPI: 57 year-old male with a personal history of elevated PSA returns today for further evaluation.   He has a complex medical history including stage 4 squamous cell carcinoma of the right lung which was diagnosed in 2022. He's managed by Dr. Cathie Hoops and Dr. Jayme Cloud. He's had multiple treatment cycles including Carboplatin, Taxol, and Keytruda. Felt by evidence of progression he was later started on Docetaxel and Ramucirumab. Most recent CT scan showed no sign of progression.  He was first referred for elevated PSA of 8.2 on 03/08/2021. It trended back down to 5.1 on 10/11/2021. He was managed conservatively. His most recent PSA from 10/17/2022 is back up to 10.4. He had a prostate MRI back in 2022 which showed a 21cc prostate, a PI-RADS 3 lesion at the right peripheral base.  He denies any urinary issues or concerns.     PMH: Past Medical History:  Diagnosis Date   Alcohol abuse    usually drinks 2-3 drinks per day   Atherosclerosis 06/2018   Cancer associated pain    Chronic sinusitis    Dehydration 02/07/2019   Emphysema of lung (HCC) 06/2018   patient unaware of this.   Hip fracture (HCC) 05/2018   no surgery   History of kidney stones 05/2018   per xray, bilateral nephrolitiasis   Hypertension    Squamous cell carcinoma of lung, right (HCC) 06/2018    Surgical History: Past Surgical History:  Procedure Laterality Date   BRAIN SURGERY  10/2017   nasal/sinus endoscopy. mass benign   ELECTROMAGNETIC NAVIGATION BROCHOSCOPY Right  06/25/2018   Procedure: ELECTROMAGNETIC NAVIGATION BRONCHOSCOPY;  Surgeon: Erin Fulling, MD;  Location: [facility];  Service: Cardiopulmonary;  Laterality: Right;   ENDOBRONCHIAL ULTRASOUND Right 06/25/2018   Procedure: ENDOBRONCHIAL ULTRASOUND;  Surgeon: Erin Fulling, MD;  Location: [facility];  Service: Cardiopulmonary;  Laterality: Right;   ENDOBRONCHIAL ULTRASOUND Right 12/26/2018   Procedure: ENDOBRONCHIAL ULTRASOUND RIGHT;  Surgeon: Erin Fulling, MD;  Location: [facility];  Service: Cardiopulmonary;  Laterality: Right;   FLEXIBLE BRONCHOSCOPY N/A 08/20/2018   Procedure: FLEXIBLE BRONCHOSCOPY PREOP;  Surgeon: Hulda Marin, MD;  Location: [facility];  Service: Thoracic;  Laterality: N/A;   IR CV LINE INJECTION  04/04/2019   NASAL SINUS SURGERY  10/2017   At [facility], frontal sinusotomy, ethmoidectomy, resection anterior cranial fossa neoplasm, turbinate resection   PERIPHERAL VASCULAR THROMBECTOMY Left 12/31/2020   Procedure: PERIPHERAL VASCULAR THROMBECTOMY;  Surgeon: Annice Needy, MD;  Location: [facility];  Service: Cardiovascular;  Laterality: Left;   PORTACATH PLACEMENT Left 01/15/2019   Procedure: INSERTION PORT-A-CATH;  Surgeon: Hulda Marin, MD;  Location: [facility];  Service: General;  Laterality: Left;   THORACOTOMY Right 08/13/2018   Procedure: PREOP BROCHOSCOPY WITH RIGHT THORACOTOMY AND RUL RESECTION;  Surgeon: Hulda Marin, MD;  Location: [facility];  Service: General;  Laterality: Right;   THORACOTOMY Right 08/20/2018   Procedure: THORACOTOMY MAJOR RIGHT UPPER LOBE LOBECTOMY;  Surgeon: Hulda Marin, MD;  Location: [facility];  Service: Thoracic;  Laterality: Right;   TOE SURGERY Left  pin in left toe    Home Medications:  Allergies as of 10/25/2022   No Known Allergies      Medication List        Accurate as of October 25, 2022  2:21 PM. If you have any questions, ask your nurse or doctor.          albuterol (2.5 MG/3ML) 0.083% nebulizer solution Commonly known as:  PROVENTIL Take 3 mLs (2.5 mg total) by nebulization every 6 (six) hours as needed for wheezing or shortness of breath.   albuterol 108 (90 Base) MCG/ACT inhaler Commonly known as: VENTOLIN HFA INHALE 2 PUFFS BY MOUTH EVERY 6 HOURS AS NEEDED FOR WHEEZING OR SHORTNESS OF BREATH   apixaban 5 MG Tabs tablet Commonly known as: Eliquis Take 1 tablet (5 mg total) by mouth 2 (two) times daily.   benzonatate 100 MG capsule Commonly known as: TESSALON Take 1 capsule (100 mg total) by mouth 3 (three) times daily as needed for cough.   budesonide-formoterol 160-4.5 MCG/ACT inhaler Commonly known as: Symbicort Inhale 2 puffs into the lungs 2 (two) times daily.   cyanocobalamin 1000 MCG tablet Commonly known as: VITAMIN B12 Take 1 tablet (1,000 mcg total) by mouth daily.   diltiazem 120 MG 24 hr capsule Commonly known as: CARDIZEM CD Take 120 mg by mouth daily.   feeding supplement Liqd Take 237 mLs by mouth 3 (three) times daily between meals.   HYDROcodone-acetaminophen 5-325 MG tablet Commonly known as: NORCO/VICODIN Take 1 tablet by mouth every 8 (eight) hours as needed for severe pain.   latanoprost 0.005 % ophthalmic solution Commonly known as: XALATAN Place 1 drop into both eyes at bedtime.   loratadine 10 MG tablet Commonly known as: CLARITIN Take 1 tablet (10 mg total) by mouth daily.   magnesium chloride 64 MG Tbec SR tablet Commonly known as: SLOW-MAG Take 1 tablet (64 mg total) by mouth daily.   methocarbamol 500 MG tablet Commonly known as: ROBAXIN Take 1 tablet (500 mg total) by mouth at bedtime.   metoprolol succinate 25 MG 24 hr tablet Commonly known as: TOPROL-XL Take 1 tablet (25 mg total) by mouth daily.   multivitamin with minerals Tabs tablet Take 1 tablet by mouth daily.   ondansetron 8 MG tablet Commonly known as: Zofran Take 1 tablet (8 mg total) by mouth every 8 (eight) hours as needed for refractory nausea / vomiting. Start on day 3 after  chemo.   pantoprazole 20 MG tablet Commonly known as: PROTONIX TAKE 1 TABLET(20 MG) BY MOUTH DAILY   Spiriva HandiHaler 18 MCG inhalation capsule Generic drug: tiotropium INHALE 1 CAPSULE VIA HANDIHALER ONCE DAILY AT THE SAME TIME EVERY DAY        Family History: Family History  Problem Relation Age of Onset   Breast cancer Mother    Diabetes Mother    Lung cancer Father    Hypertension Father     Social History:  reports that he quit smoking about 4 years ago. His smoking use included cigarettes. He has a 7.50 pack-year smoking history. He has never used smokeless tobacco. He reports current alcohol use of about 3.0 standard drinks of alcohol per week. He reports that he does not use drugs.   Physical Exam: BP 114/75   Pulse 90   Ht  (1.854 m)   Wt 132 lb (59.9 kg)   BMI 17.42 kg/m   Constitutional:  Alert and oriented, No acute distress. HEENT: [place] AT, moist mucus membranes.  Trachea midline, no masses. GU: Prostate mildly enlarged, rubbery nodules bilaterally at the base Neurologic: Grossly intact, no focal deficits, moving all 4 extremities. Psychiatric: Normal mood and affect.   Assessment & Plan:    Elevated/ rising PSA  - Given light of his medical comorbidities, continue to push for conservative approach which he agrees with -We'll plan to repeat his PSA again in three to six months. If it's still rising, consider repeat MRI. He is in agreement with this plan.  Return in about 3 months (around 01/24/2023) for PSA.  I have reviewed the above documentation for accuracy and completeness, and I agree with the above.   Samuel Scotland, MD   [facility] [address], [address] [place], [place] [address] [phone]

## 2022-10-26 ENCOUNTER — Inpatient Hospital Stay (HOSPITAL_BASED_OUTPATIENT_CLINIC_OR_DEPARTMENT_OTHER): Payer: Medicare Other | Admitting: Oncology

## 2022-10-26 ENCOUNTER — Inpatient Hospital Stay: Payer: Medicare Other

## 2022-10-26 ENCOUNTER — Encounter: Payer: Self-pay | Admitting: Oncology

## 2022-10-26 ENCOUNTER — Inpatient Hospital Stay: Payer: Medicare Other | Attending: Oncology

## 2022-10-26 VITALS — BP 126/78 | HR 100 | Temp 96.8°F | Wt 133.1 lb

## 2022-10-26 DIAGNOSIS — C3491 Malignant neoplasm of unspecified part of right bronchus or lung: Secondary | ICD-10-CM

## 2022-10-26 DIAGNOSIS — Z95828 Presence of other vascular implants and grafts: Secondary | ICD-10-CM

## 2022-10-26 DIAGNOSIS — Z86718 Personal history of other venous thrombosis and embolism: Secondary | ICD-10-CM

## 2022-10-26 DIAGNOSIS — Z7901 Long term (current) use of anticoagulants: Secondary | ICD-10-CM | POA: Diagnosis not present

## 2022-10-26 DIAGNOSIS — E876 Hypokalemia: Secondary | ICD-10-CM | POA: Diagnosis not present

## 2022-10-26 DIAGNOSIS — J9611 Chronic respiratory failure with hypoxia: Secondary | ICD-10-CM | POA: Insufficient documentation

## 2022-10-26 DIAGNOSIS — Z87891 Personal history of nicotine dependence: Secondary | ICD-10-CM | POA: Insufficient documentation

## 2022-10-26 DIAGNOSIS — Z9981 Dependence on supplemental oxygen: Secondary | ICD-10-CM | POA: Insufficient documentation

## 2022-10-26 DIAGNOSIS — Z5111 Encounter for antineoplastic chemotherapy: Secondary | ICD-10-CM

## 2022-10-26 LAB — COMPREHENSIVE METABOLIC PANEL
ALT: 15 U/L (ref 0–44)
AST: 27 U/L (ref 15–41)
Albumin: 3.8 g/dL (ref 3.5–5.0)
Alkaline Phosphatase: 103 U/L (ref 38–126)
Anion gap: 12 (ref 5–15)
BUN: 13 mg/dL (ref 6–20)
CO2: 23 mmol/L (ref 22–32)
Calcium: 9.2 mg/dL (ref 8.9–10.3)
Chloride: 100 mmol/L (ref 98–111)
Creatinine, Ser: 0.94 mg/dL (ref 0.61–1.24)
GFR, Estimated: >60 mL/min (ref >60)
Glucose, Bld: 70 mg/dL (ref 70–99)
Potassium: 3.4 mmol/L — ABNORMAL LOW (ref 3.5–5.1)
Sodium: 135 mmol/L (ref 135–145)
Total Bilirubin: 0.6 mg/dL (ref 0.3–1.2)
Total Protein: 7.9 g/dL (ref 6.5–8.1)

## 2022-10-26 LAB — CBC WITH DIFFERENTIAL/PLATELET
Abs Immature Granulocytes: 0.06 10*3/uL (ref 0.00–0.07)
Basophils Absolute: 0.1 10*3/uL (ref 0.0–0.1)
Basophils Relative: 1 %
Eosinophils Absolute: 0.4 10*3/uL (ref 0.0–0.5)
Eosinophils Relative: 4 %
HCT: 41.3 % (ref 39.0–52.0)
Hemoglobin: 14.1 g/dL (ref 13.0–17.0)
Immature Granulocytes: 1 %
Lymphocytes Relative: 13 %
Lymphs Abs: 1.5 10*3/uL (ref 0.7–4.0)
MCH: 34.1 pg — ABNORMAL HIGH (ref 26.0–34.0)
MCHC: 34.1 g/dL (ref 30.0–36.0)
MCV: 100 fL (ref 80.0–100.0)
Monocytes Absolute: 1 10*3/uL (ref 0.1–1.0)
Monocytes Relative: 9 %
Neutro Abs: 8.4 10*3/uL — ABNORMAL HIGH (ref 1.7–7.7)
Neutrophils Relative %: 72 %
Platelets: 157 10*3/uL (ref 150–400)
RBC: 4.13 MIL/uL — ABNORMAL LOW (ref 4.22–5.81)
RDW: 13.3 % (ref 11.5–15.5)
WBC: 11.5 10*3/uL — ABNORMAL HIGH (ref 4.0–10.5)
nRBC: 0 % (ref 0.0–0.2)

## 2022-10-26 LAB — MAGNESIUM: Magnesium: 1.6 mg/dL — ABNORMAL LOW (ref 1.7–2.4)

## 2022-10-26 MED ORDER — ALTEPLASE 2 MG IJ SOLR
2.0000 mg | Freq: Once | INTRAMUSCULAR | Status: AC
  Administered 2022-10-26: 2 mg
  Filled 2022-10-26: qty 2

## 2022-10-26 MED ORDER — SODIUM CHLORIDE 0.9 % IV SOLN
INTRAVENOUS | Status: DC
  Filled 2022-10-26: qty 250

## 2022-10-26 MED ORDER — POTASSIUM CHLORIDE CRYS ER 20 MEQ PO TBCR: 20.0000 meq | EXTENDED_RELEASE_TABLET | Freq: Every day | ORAL | 0 refills | Status: DC

## 2022-10-26 MED ORDER — APIXABAN 2.5 MG PO TABS: 2.5000 mg | ORAL_TABLET | Freq: Two times a day (BID) | ORAL | 1 refills | Status: DC

## 2022-10-26 MED ORDER — LORATADINE 10 MG PO TABS: 10.0000 mg | ORAL_TABLET | Freq: Every day | ORAL | 1 refills | Status: DC | PRN

## 2022-10-26 MED ORDER — MAGNESIUM SULFATE 2 GM/50ML IV SOLN
2.0000 g | Freq: Once | INTRAVENOUS | Status: AC
  Administered 2022-10-26: 2 g via INTRAVENOUS
  Filled 2022-10-26: qty 50

## 2022-10-26 NOTE — Assessment & Plan Note (Signed)
Unprovoked left lower extremity DVT, status post thrombolysis/thrombectomy, Nonocclusive PE  Decrease to Eliquis to 2.5 mg twice daily. Rx sent.  

## 2022-10-26 NOTE — Patient Instructions (Signed)
Hypomagnesemia Hypomagnesemia is a condition in which the level of magnesium in the blood is too low. Magnesium is a mineral that is found in many foods. It is used in many different processes in the body. Hypomagnesemia can affect every organ in the body. In severe cases, it can cause life-threatening problems. What are the causes? This condition may be caused by: Not getting enough magnesium in your diet or not having enough healthy foods to eat (malnutrition). Problems with magnesium absorption in the intestines. Dehydration. Excessive use of alcohol. Vomiting. Severe or long-term (chronic) diarrhea. Some medicines, including medicines that make you urinate more often (diuretics). Certain diseases, such as kidney disease, diabetes, celiac disease, and overactive thyroid. What are the signs or symptoms? Symptoms of this condition include: Loss of appetite, nausea, and vomiting. Involuntary shaking or trembling of a body part (tremor). Muscle weakness or tingling in the arms and legs. Sudden tightening of muscles (muscle spasms). Confusion. Psychiatric issues, such as: Depression and irritability. Psychosis. A feeling of fluttering of the heart (palpitations). Seizures. These symptoms are more severe if magnesium levels drop suddenly. How is this diagnosed? This condition may be diagnosed based on: Your symptoms and medical history. A physical exam. Blood and urine tests. How is this treated? Treatment depends on the cause and the severity of the condition. It may be treated by: Taking a magnesium supplement. This can be taken in pill form. If the condition is severe, magnesium is usually given through an IV. Making changes to your diet. You may be directed to eat foods that have a lot of magnesium, such as green leafy vegetables, peas, beans, and nuts. Not drinking alcohol. If you are struggling not to drink, ask your health care provider for help. Follow these instructions at  home: Eating and drinking     Make sure that your diet includes foods with magnesium. Foods that have a lot of magnesium in them include: Green leafy vegetables, such as spinach and broccoli. Beans and peas. Nuts and seeds, such as almonds and sunflower seeds. Whole grains, such as whole grain bread and fortified cereals. Drink fluids that contain salts and minerals (electrolytes), such as sports drinks, when you are active. Do not drink alcohol. General instructions Take over-the-counter and prescription medicines only as told by your health care provider. Take magnesium supplements as directed if your health care provider tells you to take them. Have your magnesium levels monitored as told by your health care provider. Keep all follow-up visits. This is important. Contact a health care provider if: You get worse instead of better. Your symptoms return. Get help right away if: You develop severe muscle weakness. You have trouble breathing. You feel that your heart is racing. These symptoms may represent a serious problem that is an emergency. Do not wait to see if the symptoms will go away. Get medical help right away. Call your local emergency services (911 in the [place] Do not drive yourself to the hospital. Summary Hypomagnesemia is a condition in which the level of magnesium in the blood is too low. Hypomagnesemia can affect every organ in the body. Treatment may include eating more foods that contain magnesium, taking magnesium supplements, and not drinking alcohol. Have your magnesium levels monitored as told by your health care provider. This information is not intended to replace advice given to you by your health care provider. Make sure you discuss any questions you have with your health care provider. Document Revised: 11/23/2020 Document Reviewed: 11/23/2020 [employer] Patient Education    2023 [employer]  

## 2022-10-26 NOTE — Assessment & Plan Note (Signed)
Recommend slow mag 1 tab daily. Rx sent.  IV mag 2g x 1

## 2022-10-26 NOTE — Assessment & Plan Note (Addendum)
Continue potassium supplementation daily x 3 days

## 2022-10-26 NOTE — Assessment & Plan Note (Addendum)
07/16/2020-09/28/2020 carboplatin Taxol and Keytruda x 1  partial response -Keytruda maintenance.-04/22/2021, progression-resumed on carboplatin/Taxol/Keytruda - did not tolerate due to myelosuppression.-->  Taxol held, carboplatin/Keytruda--> CT 07/2020 progression --> 08/03/2021 started docetaxel and ramucirumab.-->confirmed to have necrotizing pneumonia pseudomonas--> chemo discontinued.  Labs are reviewed and discussed with patient. CT scan showed no signs of cancer progression. No chemo needed.  Repeat CT chest in 4 months.

## 2022-10-26 NOTE — Progress Notes (Signed)
Hematology/Oncology Progress note [phone] [phone]    Reason for Visit  Follow up for lung cancer treatments.  ASSESSMENT & PLAN:   Cancer Staging  Squamous cell carcinoma of right lung Staging form: Lung, AJCC 8th Edition - Clinical stage from 07/08/2020: Stage IVA (rcT4, cM1a) - Signed by Rickard Patience, MD on 07/08/2020   Squamous cell carcinoma of right lung (HCC) 07/16/2020-09/28/2020 carboplatin Taxol and Keytruda x 1  partial response -Keytruda maintenance.-04/22/2021, progression-resumed on carboplatin/Taxol/Keytruda - did not tolerate due to myelosuppression.-->  Taxol held, carboplatin/Keytruda--> CT 07/2020 progression --> 08/03/2021 started docetaxel and ramucirumab.-->confirmed to have necrotizing pneumonia pseudomonas--> chemo discontinued.  Labs are reviewed and discussed with patient. CT scan showed no signs of cancer progression. No chemo needed.  Repeat CT chest in 4 months.   History of thrombosis Unprovoked left lower extremity DVT, status post thrombolysis/thrombectomy, Nonocclusive PE  Decrease to Eliquis to 2.5 mg twice daily. Rx sent.   Hypomagnesemia Recommend slow mag 1 tab daily. Rx sent.  IV mag 2g x 1   Hypokalemia Continue potassium supplementation daily x 3 days  Orders Placed This Encounter  Procedures   CT CHEST ABDOMEN PELVIS W CONTRAST    Standing Status:   Future    Standing Expiration Date:   10/26/2023    Order Specific Question:   If indicated for the ordered procedure, I authorize the administration of contrast media per Radiology protocol    Answer:   Yes    Order Specific Question:   Does the patient have a contrast media/X-ray dye allergy?    Answer:   Yes    Order Specific Question:   Preferred imaging location?    Answer:   [facility]    Order Specific Question:   If indicated for the ordered procedure, I authorize the administration of oral contrast media per Radiology protocol    Answer:   Yes   CBC  with Differential (Cancer Center Only)    Standing Status:   Future    Standing Expiration Date:   10/26/2023   CMP (Cancer Center only)    Standing Status:   Future    Standing Expiration Date:   10/26/2023   Magnesium    Standing Status:   Future    Standing Expiration Date:   10/26/2023    Follow-up 4 months  All questions were answered. The patient knows to call the clinic with any problems, questions or concerns.  Rickard Patience, MD, PhD [facility] Hematology Oncology 10/26/2022     HPI  Oncology History  Squamous cell carcinoma of right lung  08/13/2018 Surgery   s/p Bronchoscopy on 06/25/2018. subcarina EBUS FNA was non diagnostic, hypocellular specimen.  08/13/18 Patient underwent right thoracotomy and wedge resection of a right upper lobe mass.  Frozen section was consistent with an inflammatory process.  On the second postop day, preliminary pathology reports high-grade malignancy.  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  08/20/2018 Patient was therefore taken back to the OR and underwent take complete lobectomy  pT1b pN0 cM0 stage I squamous cell lung cancer. Margin is negative.  Recommend observation.     08/13/2018 Initial Diagnosis   Stage I Squamous cell carcinoma of lung, right (HCC)   12/03/2018 Progression   12/03/2018 CT chest showed abnormal soft tissue in the right suprahilar region measuring up to 2.5 cm, worrisome for recurrence 12/10/2018 PET Hypermetabolic RIGHT suprahilar mass consistent with lung cancer, recurrence. 12/26/2018 s/p bronchoscopy biopsy. Confirmed local recurrence of squamous  cell lung cancer.  medi port placed by Dr.Oaks. 01/20/19 MRI brain w/wo contrast: No evidence of metastatic disease   01/17/2019 - 03/07/2019 Chemotherapy   concurrent chemoradiation on 01/16/2019 Carboplatin AUC of 2 and Taxol 45 mg/m2 weekly finished in 03/05/2019    03/31/2019 Imaging   03/31/19 CT chest showed Right suprahilar lesion has decreased slightly in size in the  interval since prior PET-CT.  No new or progressive findings on today's study.   04/10/2019 - 05/20/2020 Chemotherapy   04/10/2019, patient started on durvalumab Q 2 weeks maintenance.    06/14/2020 Progression   06/14/2020 CT chest w contrast showed vidence of progressive lymphangitic spread of tumor throughout the right lung, with contralateral lung nodules with  right pleural effusion  07/01/22 MRI brain is negative for CNS involvement.  CT abdomen with contrast showed no evidence of abdominal metastatic disease PET scan was not approved by insurance-peer to peer appeal with Dr.Vipul Bhanderi    07/08/2020 Cancer Staging   Staging form: Lung, AJCC 8th Edition - Clinical stage from 07/08/2020: Stage IVA (rcT4, cM1a) - Signed by Rickard Patience, MD on 07/08/2020   07/16/2020 - 09/28/2021 Chemotherapy   Carboplatin + Paclitaxel + Pembrolizumab q21d x 4 cycles      10/15/2020 Imaging   10/15/2020 CT chest abdomen pelvis redemonstrated postoperative and postradiation appearance of the right chest with perihilar consolidation and fibrosis.  Slight interval decrease in size of the pulmonary nodules associated with interlobular septal thickening nearly complete resolution of previously seen left-sided pulmonary nodules.  Minimal irregular residual.  Right pleural effusion improved.  Consistent with treatment response.  No evidence of metastatic disease with the abdomen or pelvis.  None obstructive bilateral nephrolithiasis  - partial response to the treatment-  12/15/2020, CT chest without contrast showed resolution of lung nodules, no evidence of recurrent disease   10/19/2020 -  Chemotherapy   Keytruda monotherapy maintenance   12/30/2020 [facility] Admission   patient was admitted due to unprovoked extensive deep vein thrombosis from calf vein to the common femoral vein on the left.  Patient was started on heparin drip.  Patient underwent thrombolysis and is a colectomy on 12/31/2020.  Anticoagulation was  switched to Eliquis at discharge   04/22/2021 Imaging   CT chest with contrast showed none occlusive pulmonary embolism, Interval progression of consolidation airspace opacity in the medial right middle lobe with bandlike opacity and bronchiectasis in the perihilar right lung,-this is compatible with evolving posttreatment changes although recurrent disease anteriorly is not excluded.Interval development of numerous bilateral pulmonary nodules, measuring up to 11 mm in the right lung base.  Concerning for metastatic disease.  Tiny right pleural effusion.    04/26/2021 -  Chemotherapy   resumed on carboplatin/Taxol/Keytruda. Patient did not tolerate his chemotherapy of carboplatin/Taxol/Keytruda on 04/26/2021. Patient has developed severe anemia status post PRBC transfusion. 05/25/2021, carboplatin AUC 5 with Keytruda 06/23/2021  continued on carboplatin AUC 5 with Keytruda 07/07/2021 carboplatin with Rande Lawman      07/15/2021 Progression   07/15/2021 CT chest with contrast showed enlarged right lower lobe thick-walled cavitary lesion in the anterior aspect of the right lower lobe.  Concerning for progression.  Multiple other small pulmonary nodules are also noted elsewhere in the lungs, many of which are stable compared to prior studies.  Several are new or clearly enlarged.  The best example is an enlarging pulmonary nodule in the left upper lobe, 8 x 6 mm comparing to 4 x 3 mm on 04/22/2021  08/03/2021 - 02/14/2022 Chemotherapy   LUNG Docetaxel + Ramucirumab q21d      11/16/2021 Imaging   1. The previously noted thick-walled cavity of concern in the right lower lobe now appears more simple and thin-walled in appearance, presumably a post infectious cavity. There is a new adjacent thick-walled cavity in the right lower lobe which is aggressive in appearance, but given the evolution of the previous lesion, this may also be of infectious etiology. 2. However, there is an enlarging mixed cystic and  solid nodule in the left upper lobe which is concerning for metastatic lesion or new primary lesion. This currently measures 11 x 5 x 10 mm. Close attention on follow-up studies is recommended. 3. No definite signs of metastatic disease to the abdomen or pelvis. 4. Extensive colonic diverticulosis most involved in the sigmoid colon where there appear to be several small diverticular abscesses. one of these appears to fistulized into the wall of the urinary bladder, without frank fistulization to the lumen of the urinary bladder at this time.    02/01/2022 - 02/05/2022 Hospital Admission   Patient was admitted for HCAP, sepsis and COPD exacerbation.  Patient was treated with IV vancomycin, cefepime, prednisone, added doxycycline to cover atypicals given CT findings.  Patient was discharged with Augmentin and doxycycline and completed 7 days of course.   02/01/2022 Imaging   CT angiogram chest with and without contrast 1. Bilateral chronic pulmonary embolus but no acute pulmonary embolus. 2. Probable pulmonary arterial hypertension. 3.  Aortic Atherosclerosis (ICD10-I70.0).  Coronary atherosclerosis. 4. Improved airspace opacity along the peripheral cavitary process in the right lower lobe, currently the thin walled air cyst or cavity measures about 1.4 cm in diameter. 5. Increased peripheral tree-in-bud reticulonodular opacities in both lower lobes in the lingula, compatible with atypical infection. 6. Airway thickening and airway plugging particularly in the right lower lobe. 7. Mosaic attenuation in the lungs, stable but nonspecific.8.  Emphysema (ICD10-J43.9). 9. Stable radiation therapy related findings in the right perihilar regions. Right upper lobectomy.   02/10/2022 - 02/12/2022 Hospital Admission   Patient was admitted due to acute hypoxic respiratory failure CT findings highly suspicious for MAC Patient had AFB sputum x 3 and has follow-up appointment with ID to review results.  Patient was  discharged on 2 L of nasal cannula oxygen   02/10/2022 Imaging   CT chest without contrast 1. Cavitary lesions of the anterior right lower lobe demonstrate increased wall thickening and there is a new irregular solid pulmonary nodule of the left upper lobe. 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 2. Stable right paramediastinal postradiation change. 3. Similar bilateral mosaic attenuation, findings can be seen in setting of small airways disease or pulmonary hypertension.4. Aortic Atherosclerosis (ICD10-I70.0) and Emphysema (ICD10-J43.9).   07/17/2022 Imaging   CT chest abdomen pelvis w contrast 1. Unchanged post treatment appearance of the right chest, with dense, perihilar fibrotic bronchiectasis, consolidation, and architectural distortion. 2. Unchanged multilobulated cystic lesion of the anterior right lower lobe, with some adjacent irregular consolidation. Findings are most consistent with sequelae of prior infection. 3. Irregular nodules previously described in the left upper lobe are almost completely resolved, consistent with resolving infection or inflammation. 4. Background of very fine centrilobular and ground-glass nodularity, most concentrated in the lung apices, most commonly seen in smoking-related respiratory bronchiolitis. 5. No evidence of metastatic disease in the abdomen or pelvis.Aortic  Atherosclerosis    10/20/2022 Imaging   CT chest wo contrast  1. Stable right perihilar postradiation change with no evidence of recurrent or metastatic disease. 2. Redemonstration of multiloculated cystic lesion of the anterior right lower lobe. Adjacent irregular consolidation is slightly decreased in size. Findings are likely sequela of prior infection. 3. Numerous small centrilobular pulmonary nodules which are most numerous in the  upper lungs, similar to prior likely due to smoking-related respiratory bronchiolitis. 4. Aortic Atherosclerosis    03/06/2022, sputum collected shows Pseudomonas  aeruginosa which explains his findings on the CT scan.  There were 2 types of pseudomonas colonies and they both are resistant o ciprofloxacin. patient was referred to infectious disease and was seen by Dr. Joylene Draft on 03/09/2022.  Patient was treated with meropenem and completed his course on 04/04/2022. Chemotherapy has been on hold   INTERVAL HISTORY Samuel Choi is a 56 y.o. male who has above history reviewed by me today presents for follow up visit for metastatic squamous cell carcinoma of lung. He reports feeling well. No new complaints.  Appetite is fair. He is now on home oxygen for chronic hypoxia respiratory failure/COPD  REVIEW OF SYSTEMS:  Review of Systems  Constitutional:  Positive for fatigue. Negative for appetite change, chills, fever and unexpected weight change.  HENT:   Negative for hearing loss and voice change.   Eyes:  Negative for eye problems and icterus.  Respiratory:  Positive for shortness of breath. Negative for chest tightness and cough.   Cardiovascular:  Negative for chest pain and leg swelling.  Gastrointestinal:  Negative for abdominal distention, abdominal pain and blood in stool.  Endocrine: Negative for hot flashes.  Genitourinary:  Negative for difficulty urinating, dysuria and frequency.   Musculoskeletal:  Negative for arthralgias.  Skin:  Negative for itching and rash.  Neurological:  Negative for extremity weakness, light-headedness and numbness.  Hematological:  Negative for adenopathy. Does not bruise/bleed easily.  Psychiatric/Behavioral:  Negative for confusion.       VITALS:  Blood pressure 126/78, pulse 100, temperature (!) 96.8 F (36 C), temperature source Tympanic, weight 133 lb 1.6 oz (60.4 kg), SpO2 93 %, peak flow (!) 2 L/min.  Wt Readings from Last 3 Encounters:   10/26/22 133 lb 1.6 oz (60.4 kg)  10/25/22 132 lb (59.9 kg)  08/17/22 134 lb (60.8 kg)    Body mass index is 17.56 kg/m.  Performance status (ECOG): 1 - Symptomatic but completely ambulatory  PHYSICAL EXAM:  Physical Exam HENT:     Head: Normocephalic and atraumatic.  Eyes:     General: No scleral icterus. Cardiovascular:     Rate and Rhythm: Normal rate.  Pulmonary:     Effort: Pulmonary effort is normal.     Breath sounds: No wheezing or rales.     Comments: Decreased breath sound bilaterally Patient is on nasal cannula oxygen. Abdominal:     General: There is no distension.     Palpations: Abdomen is soft.     Tenderness: There is no abdominal tenderness.  Musculoskeletal:        General: No swelling.     Cervical back: Normal range of motion.  Skin:    General: Skin is warm and dry.  Neurological:     General: No focal deficit present.     Mental Status: He is alert. Mental status is at baseline.  Psychiatric:        Mood and Affect: Mood normal.     LABS:  Latest Ref Rng & Units 10/26/2022    9:33 AM 07/13/2022   11:08 AM 02/15/2022    8:52 AM  CBC  WBC 4.0 - 10.5 K/uL 11.5  13.1  26.5   Hemoglobin 13.0 - 17.0 g/dL 16.1  09.6  8.5   Hematocrit 39.0 - 52.0 % 41.3  40.6  24.6   Platelets 150 - 400 K/uL 157  175  108       Latest Ref Rng & Units 10/26/2022    9:33 AM 07/13/2022   11:08 AM 02/15/2022    8:52 AM  CMP  Glucose 70 - 99 mg/dL 70  93  045   BUN 6 - 20 mg/dL 13  13  11    Creatinine 0.61 - 1.24 mg/dL 4.09  8.11  9.14   Sodium 135 - 145 mmol/L 135  139  139   Potassium 3.5 - 5.1 mmol/L 3.4  3.5  4.0   Chloride 98 - 111 mmol/L 100  101  102   CO2 22 - 32 mmol/L 23  25  27    Calcium 8.9 - 10.3 mg/dL 9.2  9.1  8.5   Total Protein 6.5 - 8.1 g/dL 7.9  7.9  6.1   Total Bilirubin 0.3 - 1.2 mg/dL 0.6  0.6  0.4   Alkaline Phos 38 - 126 U/L 103  103  101   AST 15 - 41 U/L 27  23  21    ALT 0 - 44 U/L 15  12  19       HISTORY:   Past Medical  History:  Diagnosis Date   Alcohol abuse    usually drinks 2-3 drinks per day   Atherosclerosis 06/2018   Cancer associated pain    Chronic sinusitis    Dehydration 02/07/2019   Emphysema of lung 06/2018   patient unaware of this.   Hip fracture 05/2018   no surgery   History of kidney stones 05/2018   per xray, bilateral nephrolitiasis   Hypertension    Squamous cell carcinoma of lung, right 06/2018    Past Surgical History:  Procedure Laterality Date   BRAIN SURGERY  10/2017   nasal/sinus endoscopy. mass benign   ELECTROMAGNETIC NAVIGATION BROCHOSCOPY Right 06/25/2018   Procedure: ELECTROMAGNETIC NAVIGATION BRONCHOSCOPY;  Surgeon: Erin Fulling, MD;  Location: [facility];  Service: Cardiopulmonary;  Laterality: Right;   ENDOBRONCHIAL ULTRASOUND Right 06/25/2018   Procedure: ENDOBRONCHIAL ULTRASOUND;  Surgeon: Erin Fulling, MD;  Location: [facility];  Service: Cardiopulmonary;  Laterality: Right;   ENDOBRONCHIAL ULTRASOUND Right 12/26/2018   Procedure: ENDOBRONCHIAL ULTRASOUND RIGHT;  Surgeon: Erin Fulling, MD;  Location: [facility];  Service: Cardiopulmonary;  Laterality: Right;   FLEXIBLE BRONCHOSCOPY N/A 08/20/2018   Procedure: FLEXIBLE BRONCHOSCOPY PREOP;  Surgeon: Hulda Marin, MD;  Location: [facility];  Service: Thoracic;  Laterality: N/A;   IR CV LINE INJECTION  04/04/2019   NASAL SINUS SURGERY  10/2017   At [facility], frontal sinusotomy, ethmoidectomy, resection anterior cranial fossa neoplasm, turbinate resection   PERIPHERAL VASCULAR THROMBECTOMY Left 12/31/2020   Procedure: PERIPHERAL VASCULAR THROMBECTOMY;  Surgeon: Annice Needy, MD;  Location: [facility];  Service: Cardiovascular;  Laterality: Left;   PORTACATH PLACEMENT Left 01/15/2019   Procedure: INSERTION PORT-A-CATH;  Surgeon: Hulda Marin, MD;  Location: [facility];  Service: General;  Laterality: Left;   THORACOTOMY Right 08/13/2018   Procedure: PREOP BROCHOSCOPY WITH RIGHT THORACOTOMY AND RUL RESECTION;  Surgeon: Hulda Marin, MD;  Location: [facility];  Service: General;  Laterality:  Right;   THORACOTOMY Right 08/20/2018   Procedure: THORACOTOMY MAJOR RIGHT UPPER LOBE LOBECTOMY;  Surgeon: Hulda Marin, MD;  Location: [facility];  Service: Thoracic;  Laterality: Right;   TOE SURGERY Left    pin in left toe    Family History  Problem Relation Age of Onset   Breast cancer Mother    Diabetes Mother    Lung cancer Father    Hypertension Father     Social History:  reports that he quit smoking about 4 years ago. His smoking use included cigarettes. He has a 7.50 pack-year smoking history. He has never used smokeless tobacco. He reports current alcohol use of about 3.0 standard drinks of alcohol per week. He reports that he does not use drugs.  Allergies: No Known Allergies  Current Medications: Current Outpatient Medications  Medication Sig Dispense Refill   albuterol (PROVENTIL) (2.5 MG/3ML) 0.083% nebulizer solution Take 3 mLs (2.5 mg total) by nebulization every 6 (six) hours as needed for wheezing or shortness of breath. 120 mL 5   albuterol (VENTOLIN HFA) 108 (90 Base) MCG/ACT inhaler INHALE 2 PUFFS BY MOUTH EVERY 6 HOURS AS NEEDED FOR WHEEZING OR SHORTNESS OF BREATH 8.5 g 5   apixaban (ELIQUIS) 2.5 MG TABS tablet Take 1 tablet (2.5 mg total) by mouth 2 (two) times daily. 180 tablet 1   benzonatate (TESSALON) 100 MG capsule Take 1 capsule (100 mg total) by mouth 3 (three) times daily as needed for cough. 20 capsule 2   budesonide-formoterol (SYMBICORT) 160-4.5 MCG/ACT inhaler Inhale 2 puffs into the lungs 2 (two) times daily. 10.2 g 11   cyanocobalamin (VITAMIN B12) 1000 MCG tablet Take 1 tablet (1,000 mcg total) by mouth daily. 90 tablet 1   diltiazem (CARDIZEM CD) 120 MG 24 hr capsule Take 120 mg by mouth daily.     feeding supplement (ENSURE ENLIVE / ENSURE PLUS) LIQD Take 237 mLs by mouth 3 (three) times daily between meals. 237 mL 12   latanoprost (XALATAN) 0.005 % ophthalmic solution Place 1 drop  into both eyes at bedtime.     magnesium chloride (SLOW-MAG) 64 MG TBEC SR tablet Take 1 tablet (64 mg total) by mouth daily. 90 tablet 1   methocarbamol (ROBAXIN) 500 MG tablet Take 1 tablet (500 mg total) by mouth at bedtime. 30 tablet 0   metoprolol succinate (TOPROL-XL) 25 MG 24 hr tablet Take 1 tablet (25 mg total) by mouth daily. 30 tablet 0   Multiple Vitamin (MULTIVITAMIN WITH MINERALS) TABS tablet Take 1 tablet by mouth daily.     ondansetron (ZOFRAN) 8 MG tablet Take 1 tablet (8 mg total) by mouth every 8 (eight) hours as needed for refractory nausea / vomiting. Start on day 3 after chemo. 90 tablet 1   pantoprazole (PROTONIX) 20 MG tablet TAKE 1 TABLET(20 MG) BY MOUTH DAILY 60 tablet 2   potassium chloride SA (KLOR-CON M) 20 MEQ tablet Take 1 tablet (20 mEq total) by mouth daily. 3 tablet 0   loratadine (CLARITIN) 10 MG tablet Take 1 tablet (10 mg total) by mouth daily as needed for allergies. 90 tablet 1   No current facility-administered medications for this visit.   Facility-Administered Medications Ordered in Other Visits  Medication Dose Route Frequency Provider Last Rate Last Admin   dexamethasone (DECADRON) 10 MG/ML injection            heparin lock flush 100 unit/mL  500 Units Intravenous Once Rickard Patience, MD       heparin  lock flush 100 unit/mL  500 Units Intravenous Once Rickard Patience, MD       sodium chloride flush (NS) 0.9 % injection 10 mL  10 mL Intravenous PRN Rickard Patience, MD   10 mL at 04/04/19 0903   sodium chloride flush (NS) 0.9 % injection 10 mL  10 mL Intravenous PRN Rickard Patience, MD   10 mL at 07/26/20 1308   sodium chloride flush (NS) 0.9 % injection 10 mL  10 mL Intravenous PRN Rickard Patience, MD   10 mL at 07/28/21 4098       IMAGE STUDIES:  CT Chest Wo Contrast  Result Date: 10/20/2022 CLINICAL DATA:  Lung cancer; * Tracking Code: BO * EXAM: CT CHEST WITHOUT CONTRAST TECHNIQUE: Multidetector CT imaging of the chest was performed following the standard protocol without IV  contrast. RADIATION DOSE REDUCTION: This exam was performed according to the departmental dose-optimization program which includes automated exposure control, adjustment of the mA and/or kV according to patient size and/or use of iterative reconstruction technique. COMPARISON:  CT chest, abdomen and pelvis dated July 17, 2022 FINDINGS: Cardiovascular: Normal heart size. Trace pericardial effusion. Normal caliber thoracic aorta with moderate atherosclerotic disease. Mild coronary artery calcifications. Mediastinum/Nodes: Esophagus and thyroid are unremarkable. No enlarged lymph nodes seen in the chest. Lungs/Pleura: Stable right paramediastinal postradiation change. Prior right upper lobectomy. Irregular solid right lower lobe nodular opacity is slightly decreased in size measuring 12 x 6 mm on series 3, image 81, previously 15 x 7 mm. Adjacent multiloculated cystic lesion which communicates with the airways is unchanged in size with slight decreased internal debris. Numerous small centrilobular pulmonary nodules which are most numerous in the upper lungs, similar to prior. No pleural effusion. Upper Abdomen: No acute abnormality. Musculoskeletal: No chest wall mass or suspicious bone lesions identified. IMPRESSION: 1. Stable right perihilar postradiation change with no evidence of recurrent or metastatic disease. 2. Redemonstration of multiloculated cystic lesion of the anterior right lower lobe. Adjacent irregular consolidation is slightly decreased in size. Findings are likely sequela of prior infection. 3. Numerous small centrilobular pulmonary nodules which are most numerous in the upper lungs, similar to prior likely due to smoking-related respiratory bronchiolitis. 4. Aortic Atherosclerosis (ICD10-I70.0). Electronically Signed   By: Allegra Lai M.D.   On: 10/20/2022 15:39

## 2022-10-28 ENCOUNTER — Other Ambulatory Visit: Payer: Self-pay | Admitting: Oncology

## 2022-10-30 ENCOUNTER — Encounter: Payer: Self-pay | Admitting: Oncology

## 2022-10-31 ENCOUNTER — Other Ambulatory Visit: Payer: Self-pay

## 2022-11-15 ENCOUNTER — Ambulatory Visit (INDEPENDENT_AMBULATORY_CARE_PROVIDER_SITE_OTHER): Payer: Medicare Other | Admitting: Pulmonary Disease

## 2022-11-15 ENCOUNTER — Encounter: Payer: Self-pay | Admitting: Pulmonary Disease

## 2022-11-15 VITALS — BP 128/80 | HR 82 | Temp 98.3°F | Ht 73.0 in | Wt 132.4 lb

## 2022-11-15 DIAGNOSIS — C3491 Malignant neoplasm of unspecified part of right bronchus or lung: Secondary | ICD-10-CM | POA: Diagnosis not present

## 2022-11-15 DIAGNOSIS — J9611 Chronic respiratory failure with hypoxia: Secondary | ICD-10-CM

## 2022-11-15 DIAGNOSIS — I272 Pulmonary hypertension, unspecified: Secondary | ICD-10-CM | POA: Diagnosis not present

## 2022-11-15 DIAGNOSIS — J449 Chronic obstructive pulmonary disease, unspecified: Secondary | ICD-10-CM | POA: Diagnosis not present

## 2022-11-15 MED ORDER — BREZTRI AEROSPHERE 160-9-4.8 MCG/ACT IN AERO: 2.0000 | INHALATION_SPRAY | Freq: Two times a day (BID) | RESPIRATORY_TRACT | 0 refills | Status: DC

## 2022-11-15 MED ORDER — BREZTRI AEROSPHERE 160-9-4.8 MCG/ACT IN AERO: 2.0000 | INHALATION_SPRAY | Freq: Two times a day (BID) | RESPIRATORY_TRACT | 11 refills | Status: DC

## 2022-11-15 MED ORDER — ALBUTEROL SULFATE HFA 108 (90 BASE) MCG/ACT IN AERS: INHALATION_SPRAY | RESPIRATORY_TRACT | 5 refills | Status: DC

## 2022-11-15 NOTE — Patient Instructions (Signed)
We we will switch your inhaler today to Breztri 2 puffs twice a day.  Rinse your mouth well after you use it.  This is pretty much like the Symbicort but with an extra ingredient.    I have refilled your albuterol.  We discussed the findings on your CT of the chest that looked good with the area that you had of infection that is well-healed now.  We will see you in follow-up in 4 to 6 months time call sooner should any new problems arise.

## 2022-11-15 NOTE — Progress Notes (Signed)
Subjective:    Patient ID: Samuel Choi, male    DOB: 1966-03-25, 57 y.o.   MRN: [id number] Patient Care Team: Rickard Patience, MD as PCP - General (Oncology) Rickard Patience, MD as Consulting Physician (Hematology and Oncology) Lynn Ito, MD as Consulting Physician (Infectious Diseases) Borders, Daryl Eastern, NP as Nurse Practitioner (Hospice and Palliative Medicine) Salena Saner, MD as Consulting Physician (Pulmonary Disease)  Chief Complaint  Patient presents with   Follow-up    SOB with exertion. Wheezing. Cough with clear sputum.    HPI Samuel Choi is a 57 year old former smoker, with a history as noted below, who presents for follow-up on the issue of abnormal imaging of the lung in the setting of squamous cell carcinoma of the right lung.  He was noted to have cavitary lesions in the lung on the right.  He had been worked up while during a hospitalization to [facility] but only sputum for MTB probe was obtained.  Patient was seen on 01 March 2022 at the clinic, and at that time he was still producing quite a bit of sputum. His cavitary lesions on the right lower lobe appeared to progress significantly and rapidly.  This was consistent with an infectious process.  Microbiology of the sputum collected on 03 March 2022 showed that the patient had 2 different strains of Pseudomonas that were quite resistant.  He was referred to the infectious disease clinic and Dr. Rivka Safer saw the patient and started him on IV meropenem.  After completion of meropenem, a follow-up chest CT on 11 April 2022 showed significant improvement on the cavitary process.  At a prior visit on 17 May 2022 he had endorsed dyspnea on exertion and an ambulatory oximetry showed that he had oxygen desaturations to 86% during ambulation.  He qualified for supplemental oxygen.  He was last seen here on 17 August 2022 and had to be requalified for oxygen due to [employer] not providing him with the proper supplemental oxygen.  He  requalified for supplemental oxygen during the February visit.  He has been on oxygen at 4 L/min with exertion and 2 L/min nocturnally.  Cough has been rare and nonproductive since he completed antibiotics.  No hemoptysis. He states he ran out of Symbicort 160/4.5 2 to 3 days ago.  Since then he has noted more shortness of breath with exertion and wheezing.  He is not any longer on Spiriva, discontinued this on her own.  He has not had issues with diarrhea (recall that he had an issue with C. difficile previously).  No orthopnea or paroxysmal nocturnal dyspnea.  No lower extremity edema and no calf tenderness.  He notes his appetite and food intake are good.   We reviewed the CT chest performed 19 October 2022, the previous changes noted with his Pseudomonas infection have cleared now leaving changes consistent with sequela of this infection which continue to improve.  There is posttreatment changes on the right chest without significant change.   Overall he feels well but continues to have dyspnea on exertion though he notes that this is better with the inhalers and oxygen supplementation.     Overall he is feeling much better and actually looks less frail.     Review of Systems A 10 point review of systems was performed and it is as noted above otherwise negative.  Patient Active Problem List   Diagnosis Date Noted   Necrotic pneumonia (HCC) 04/07/2022   Cavitary lesion of lung 02/11/2022   Acute respiratory  failure (HCC) 02/10/2022   Lactic acidosis 02/10/2022   SVT (supraventricular tachycardia) 02/02/2022   Sepsis (HCC) 02/01/2022   Hyponatremia 02/01/2022   Chronic diastolic CHF (congestive heart failure) (HCC) 02/01/2022   Macrocytic anemia 02/01/2022   Elevated serum creatinine 01/25/2022   Diverticulosis of colon 11/18/2021   History of thrombosis 11/18/2021   Weight loss 11/09/2021   C. difficile colitis 10/19/2021   Hyperkalemia 07/28/2021   Severe protein-calorie malnutrition  (HCC) 06/08/2021   Antineoplastic chemotherapy induced anemia 06/08/2021   Pulmonary embolus (HCC) 06/08/2021   Chemotherapy induced nausea and vomiting 05/17/2021   Symptomatic anemia 05/17/2021   Wide-complex tachycardia    Chemotherapy induced neutropenia (HCC) 11/09/2020   COPD (chronic obstructive pulmonary disease) (HCC) 04/22/2020   Sinus tachycardia 12/18/2019   Hypomagnesemia 12/10/2019   Encounter for antineoplastic immunotherapy 11/24/2019   Ex-smoker 07/23/2019   Port-A-Cath in place 04/04/2019   Thrombocytopenia (HCC) 04/04/2019   Folate deficiency 04/04/2019   Hypokalemia 04/04/2019   B12 deficiency 04/04/2019   Alcohol abuse 04/04/2019   Dehydration 02/07/2019   Encounter for antineoplastic chemotherapy 01/31/2019   Squamous cell carcinoma of right lung (HCC) 09/03/2018   Goals of care, counseling/discussion 09/03/2018   Malnutrition of moderate degree 08/23/2018   Emphysema of lung (HCC) 06/2018   Allergic rhinitis 06/2018   H/O nasal polyp 02/20/2018   Acute sinusitis 10/10/2017   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/10/2017   Chronic rhinitis 10/10/2017   Mass of sinus 10/10/2017   Essential hypertension 09/27/2017   Tobacco abuse 09/27/2017   Social History   Tobacco Use   Smoking status: Former    Packs/day: 0.50    Years: 15.00    Additional pack years: 0.00    Total pack years: 7.50    Types: Cigarettes    Quit date: 08/2018    Years since quitting: 4.2   Smokeless tobacco: Never  Substance Use Topics   Alcohol use: Yes    Alcohol/week: 3.0 standard drinks of alcohol    Types: 3 Cans of beer per week    Comment: usually 2 drinks per week, per patient   No Known Allergies  Current Meds  Medication Sig   albuterol (PROVENTIL) (2.5 MG/3ML) 0.083% nebulizer solution Take 3 mLs (2.5 mg total) by nebulization every 6 (six) hours as needed for wheezing or shortness of breath.   albuterol (VENTOLIN HFA) 108 (90 Base) MCG/ACT inhaler INHALE 2 PUFFS BY MOUTH  EVERY 6 HOURS AS NEEDED FOR WHEEZING OR SHORTNESS OF BREATH   apixaban (ELIQUIS) 2.5 MG TABS tablet Take 1 tablet (2.5 mg total) by mouth 2 (two) times daily.   benzonatate (TESSALON) 100 MG capsule Take 1 capsule (100 mg total) by mouth 3 (three) times daily as needed for cough.   budesonide-formoterol (SYMBICORT) 160-4.5 MCG/ACT inhaler Inhale 2 puffs into the lungs 2 (two) times daily.   cyanocobalamin (VITAMIN B12) 1000 MCG tablet Take 1 tablet (1,000 mcg total) by mouth daily.   diltiazem (CARDIZEM CD) 120 MG 24 hr capsule Take 120 mg by mouth daily.   feeding supplement (ENSURE ENLIVE / ENSURE PLUS) LIQD Take 237 mLs by mouth 3 (three) times daily between meals.   latanoprost (XALATAN) 0.005 % ophthalmic solution Place 1 drop into both eyes at bedtime.   loratadine (CLARITIN) 10 MG tablet Take 1 tablet (10 mg total) by mouth daily as needed for allergies.   magnesium chloride (SLOW-MAG) 64 MG TBEC SR tablet Take 1 tablet (64 mg total) by mouth daily.   methocarbamol (ROBAXIN) 500  MG tablet Take 1 tablet (500 mg total) by mouth at bedtime.   metoprolol succinate (TOPROL-XL) 25 MG 24 hr tablet Take 1 tablet (25 mg total) by mouth daily.   Multiple Vitamin (MULTIVITAMIN WITH MINERALS) TABS tablet Take 1 tablet by mouth daily.   ondansetron (ZOFRAN) 8 MG tablet Take 1 tablet (8 mg total) by mouth every 8 (eight) hours as needed for refractory nausea / vomiting. Start on day 3 after chemo.   pantoprazole (PROTONIX) 20 MG tablet TAKE 1 TABLET(20 MG) BY MOUTH DAILY   potassium chloride SA (KLOR-CON M) 20 MEQ tablet Take 1 tablet (20 mEq total) by mouth daily.   Immunization History  Administered Date(s) Administered   Influenza Inj Mdck Quad Pf 03/28/2019   Influenza, High Dose Seasonal PF 03/28/2019   Influenza-Unspecified 03/31/2022   Moderna Sars-Covid-2 Vaccination 11/14/2019   Pneumococcal Conjugate-13 02/17/2021   Pneumococcal Polysaccharide-23 11/19/2018   Tdap 12/31/2017   Zoster  Recombinat (Shingrix) 03/28/2019, 05/01/2019, 06/09/2019       Objective:   Physical Exam BP 128/80 (BP Location: Left Arm, Cuff Size: Normal)   Pulse 82   Temp 98.3 F (36.8 C)   Ht 6\' 1"  (1.854 m)   Wt 132 lb 6.4 oz (60.1 kg)   SpO2 99%   BMI 17.47 kg/m   SpO2: 99 % O2 Device: None (Room air)  GENERAL: Thin, gentleman, presents fully ambulatory.  No acute distress. HEAD: Normocephalic, atraumatic.  Alopecia due to chemotherapy. EYES: Pupils equal, round, reactive to light.  No scleral icterus.  Disconjugate gaze (exotropia of right eye). MOUTH: Poor dentition, oral mucosa moist.  No thrush. NECK: Supple. No thyromegaly. Trachea midline. No JVD.  No adenopathy. PULMONARY: Good air entry bilaterally.  Clear to auscultation bilaterally. CARDIOVASCULAR: S1 and S2.  Regular rate and rhythm.  No rubs, murmurs or gallops heard. ABDOMEN: Scaphoid, otherwise benign. MUSCULOSKELETAL: No joint deformity, no clubbing, no edema.  Significant sarcopenia. NEUROLOGIC: Fully ambulatory today, occasional disconjugate gaze.  Otherwise no focality. SKIN: Intact,warm,dry. PSYCH: Mood and behavior normal.  Representative image from CT performed 17 August 2022 showing residual changes from prior Pseudomonas infection (arrow):     Assessment & Plan:     ICD-10-CM   1. Stage 2 moderate COPD by GOLD classification (HCC)  J44.9    Patient out of Symbicort Discontinued Spiriva on own Switch to Breztri 2 puffs twice a day Continue as needed albuterol    2. Chronic respiratory failure with hypoxia (HCC)  J96.11    Continue oxygen at 4 L/min with ambulation Continue oxygen at 2 L/min during sleep    3. Severe pulmonary hypertension (HCC)  I27.20    Noted on echo July 2023 Likely related to hypoxia/COPD Continue oxygen supplementation    4. Squamous cell carcinoma of right lung (HCC) Stage IV a  C34.91    Continue follow-up with oncology This issue adds complexity to his management      Meds ordered this encounter  Medications   Budeson-Glycopyrrol-Formoterol (BREZTRI AEROSPHERE) 160-9-4.8 MCG/ACT AERO    Sig: Inhale 2 puffs into the lungs in the morning and at bedtime.    Dispense:  10.7 g    Refill:  11   albuterol (VENTOLIN HFA) 108 (90 Base) MCG/ACT inhaler    Sig: INHALE 2 PUFFS BY MOUTH EVERY 6 HOURS AS NEEDED FOR WHEEZING OR SHORTNESS OF BREATH    Dispense:  8.5 g    Refill:  5   Budeson-Glycopyrrol-Formoterol (BREZTRI AEROSPHERE) 160-9-4.8 MCG/ACT AERO  Sig: Inhale 2 puffs into the lungs in the morning and at bedtime.    Dispense:  11.8 g    Refill:  0    Order Specific Question:   Lot Number?    Answer:   [id number]    Order Specific Question:   Expiration Date?    Answer:   02/07/2025    Order Specific Question:   Manufacturer?    Answer:   [employer] [[id number]]    Order Specific Question:   Quantity    Answer:   2   Will see the patient in follow-up in 4 to 6 months time he is to contact us prior to that time should any new difficulties arise.  Gailen Shelter, MD [facility]-    *This note was dictated using voice recognition software/Dragon.  Despite best efforts to proofread, errors can occur which can change the meaning. Any transcriptional errors that result from this process are unintentional and may not be fully corrected at the time of dictation.

## 2022-11-28 ENCOUNTER — Other Ambulatory Visit: Payer: Self-pay | Admitting: *Deleted

## 2022-11-28 ENCOUNTER — Other Ambulatory Visit: Payer: Self-pay

## 2022-11-28 MED ORDER — METOPROLOL SUCCINATE ER 25 MG PO TB24: 25.0000 mg | ORAL_TABLET | Freq: Every day | ORAL | 2 refills | Status: AC

## 2022-11-28 MED ORDER — PANTOPRAZOLE SODIUM 20 MG PO TBEC: DELAYED_RELEASE_TABLET | ORAL | 2 refills | Status: DC

## 2022-11-28 NOTE — Telephone Encounter (Signed)
Refill for protonix sent to Elouise Munroe, NP. We do not have any f/u with pt. Dr. Cathie Hoops- will you consider RF script.

## 2022-12-18 ENCOUNTER — Other Ambulatory Visit: Payer: Self-pay | Admitting: *Deleted

## 2022-12-18 MED ORDER — PANTOPRAZOLE SODIUM 20 MG PO TBEC: DELAYED_RELEASE_TABLET | ORAL | 1 refills | Status: DC

## 2022-12-21 ENCOUNTER — Telehealth: Payer: Self-pay

## 2022-12-21 ENCOUNTER — Inpatient Hospital Stay: Payer: Medicare Other | Attending: Oncology

## 2022-12-21 DIAGNOSIS — C3491 Malignant neoplasm of unspecified part of right bronchus or lung: Secondary | ICD-10-CM | POA: Insufficient documentation

## 2022-12-21 DIAGNOSIS — Z452 Encounter for adjustment and management of vascular access device: Secondary | ICD-10-CM | POA: Diagnosis present

## 2022-12-21 DIAGNOSIS — Z5111 Encounter for antineoplastic chemotherapy: Secondary | ICD-10-CM

## 2022-12-21 LAB — CBC WITH DIFFERENTIAL/PLATELET
Abs Immature Granulocytes: 0.06 10*3/uL (ref 0.00–0.07)
Basophils Absolute: 0.1 10*3/uL (ref 0.0–0.1)
Basophils Relative: 1 %
Eosinophils Absolute: 0.3 10*3/uL (ref 0.0–0.5)
Eosinophils Relative: 3 %
HCT: 43.3 % (ref 39.0–52.0)
Hemoglobin: 14.8 g/dL (ref 13.0–17.0)
Immature Granulocytes: 1 %
Lymphocytes Relative: 14 %
Lymphs Abs: 1.6 10*3/uL (ref 0.7–4.0)
MCH: 35.6 pg — ABNORMAL HIGH (ref 26.0–34.0)
MCHC: 34.2 g/dL (ref 30.0–36.0)
MCV: 104.1 fL — ABNORMAL HIGH (ref 80.0–100.0)
Monocytes Absolute: 1.2 10*3/uL — ABNORMAL HIGH (ref 0.1–1.0)
Monocytes Relative: 10 %
Neutro Abs: 8.2 10*3/uL — ABNORMAL HIGH (ref 1.7–7.7)
Neutrophils Relative %: 71 %
Platelets: 205 10*3/uL (ref 150–400)
RBC: 4.16 MIL/uL — ABNORMAL LOW (ref 4.22–5.81)
RDW: 13.5 % (ref 11.5–15.5)
WBC: 11.3 10*3/uL — ABNORMAL HIGH (ref 4.0–10.5)
nRBC: 0 % (ref 0.0–0.2)

## 2022-12-21 LAB — COMPREHENSIVE METABOLIC PANEL
ALT: 16 U/L (ref 0–44)
AST: 25 U/L (ref 15–41)
Albumin: 3.8 g/dL (ref 3.5–5.0)
Alkaline Phosphatase: 120 U/L (ref 38–126)
Anion gap: 16 — ABNORMAL HIGH (ref 5–15)
BUN: 14 mg/dL (ref 6–20)
CO2: 23 mmol/L (ref 22–32)
Calcium: 9.8 mg/dL (ref 8.9–10.3)
Chloride: 99 mmol/L (ref 98–111)
Creatinine, Ser: 1.2 mg/dL (ref 0.61–1.24)
GFR, Estimated: >60 mL/min (ref >60)
Glucose, Bld: 68 mg/dL — ABNORMAL LOW (ref 70–99)
Potassium: 4.6 mmol/L (ref 3.5–5.1)
Sodium: 138 mmol/L (ref 135–145)
Total Bilirubin: 0.5 mg/dL (ref 0.3–1.2)
Total Protein: 8.4 g/dL — ABNORMAL HIGH (ref 6.5–8.1)

## 2022-12-21 LAB — MAGNESIUM: Magnesium: 1.7 mg/dL (ref 1.7–2.4)

## 2022-12-21 MED ORDER — SODIUM CHLORIDE 0.9% FLUSH
10.0000 mL | INTRAVENOUS | Status: AC | PRN
  Administered 2022-12-21: 10 mL via INTRAVENOUS
  Filled 2022-12-21: qty 10

## 2022-12-21 MED ORDER — HEPARIN SOD (PORK) LOCK FLUSH 100 UNIT/ML IV SOLN
500.0000 [IU] | Freq: Once | INTRAVENOUS | Status: AC
  Administered 2022-12-21: 500 [IU] via INTRAVENOUS
  Filled 2022-12-21: qty 5

## 2022-12-21 NOTE — Telephone Encounter (Signed)
Received message from Infusion nurse Amy E, stating that pt port did not give blood return. Dr. Cathie Hoops would like pt to receive cathflo.   Called and informed of MD recommendation. Pt verbalized understanding.   Please schedule cathflo, either today or next week and inform pt of appt details.

## 2022-12-21 NOTE — Telephone Encounter (Signed)
Per Morrie Sheldon, pt has been scheduled and notified of appt.

## 2022-12-22 ENCOUNTER — Inpatient Hospital Stay: Payer: Medicare Other

## 2022-12-22 DIAGNOSIS — Z95828 Presence of other vascular implants and grafts: Secondary | ICD-10-CM

## 2022-12-22 DIAGNOSIS — Z452 Encounter for adjustment and management of vascular access device: Secondary | ICD-10-CM | POA: Diagnosis not present

## 2022-12-22 MED ORDER — SODIUM CHLORIDE 0.9% FLUSH
10.0000 mL | INTRAVENOUS | Status: DC | PRN
  Administered 2022-12-22: 10 mL via INTRAVENOUS
  Filled 2022-12-22: qty 10

## 2022-12-22 MED ORDER — HEPARIN SOD (PORK) LOCK FLUSH 100 UNIT/ML IV SOLN
500.0000 [IU] | Freq: Once | INTRAVENOUS | Status: AC
  Administered 2022-12-22: 500 [IU] via INTRAVENOUS
  Filled 2022-12-22: qty 5

## 2022-12-22 MED ORDER — ALTEPLASE 2 MG IJ SOLR
2.0000 mg | Freq: Once | INTRAMUSCULAR | Status: AC
  Administered 2022-12-22: 2 mg
  Filled 2022-12-22: qty 2

## 2023-01-14 ENCOUNTER — Other Ambulatory Visit: Payer: Self-pay | Admitting: Oncology

## 2023-01-18 ENCOUNTER — Encounter: Payer: Self-pay | Admitting: Oncology

## 2023-02-11 ENCOUNTER — Other Ambulatory Visit: Payer: Self-pay | Admitting: Oncology

## 2023-02-12 ENCOUNTER — Encounter: Payer: Self-pay | Admitting: Oncology

## 2023-02-21 ENCOUNTER — Other Ambulatory Visit: Payer: Self-pay | Admitting: Oncology

## 2023-02-23 ENCOUNTER — Encounter: Payer: Self-pay | Admitting: Oncology

## 2023-02-23 ENCOUNTER — Ambulatory Visit
Admission: RE | Admit: 2023-02-23 | Discharge: 2023-02-23 | Disposition: A | Discharge status: Home / Self Care | Payer: Medicare Other | Source: Ambulatory Visit | Attending: Oncology | Admitting: Oncology

## 2023-02-23 DIAGNOSIS — C3491 Malignant neoplasm of unspecified part of right bronchus or lung: Secondary | ICD-10-CM | POA: Diagnosis not present

## 2023-02-23 MED ORDER — IOHEXOL 300 MG/ML  SOLN
85.0000 mL | Freq: Once | INTRAMUSCULAR | Status: AC | PRN
  Administered 2023-02-23: 85 mL via INTRAVENOUS

## 2023-02-26 ENCOUNTER — Inpatient Hospital Stay: Payer: Medicare Other

## 2023-02-26 ENCOUNTER — Encounter: Payer: Self-pay | Admitting: Oncology

## 2023-02-26 ENCOUNTER — Inpatient Hospital Stay (HOSPITAL_BASED_OUTPATIENT_CLINIC_OR_DEPARTMENT_OTHER): Payer: Medicare Other | Admitting: Oncology

## 2023-02-26 ENCOUNTER — Inpatient Hospital Stay: Payer: Medicare Other | Attending: Oncology

## 2023-02-26 VITALS — BP 132/79 | HR 107 | Temp 97.3°F | Resp 18 | Wt 136.5 lb

## 2023-02-26 DIAGNOSIS — C3491 Malignant neoplasm of unspecified part of right bronchus or lung: Secondary | ICD-10-CM

## 2023-02-26 DIAGNOSIS — Z923 Personal history of irradiation: Secondary | ICD-10-CM | POA: Diagnosis not present

## 2023-02-26 DIAGNOSIS — Z7952 Long term (current) use of systemic steroids: Secondary | ICD-10-CM | POA: Diagnosis not present

## 2023-02-26 DIAGNOSIS — Z79899 Other long term (current) drug therapy: Secondary | ICD-10-CM | POA: Diagnosis not present

## 2023-02-26 DIAGNOSIS — Z7901 Long term (current) use of anticoagulants: Secondary | ICD-10-CM | POA: Diagnosis not present

## 2023-02-26 DIAGNOSIS — Z9221 Personal history of antineoplastic chemotherapy: Secondary | ICD-10-CM | POA: Insufficient documentation

## 2023-02-26 DIAGNOSIS — Z86718 Personal history of other venous thrombosis and embolism: Secondary | ICD-10-CM | POA: Insufficient documentation

## 2023-02-26 DIAGNOSIS — Z902 Acquired absence of lung [part of]: Secondary | ICD-10-CM | POA: Diagnosis not present

## 2023-02-26 DIAGNOSIS — Z95828 Presence of other vascular implants and grafts: Secondary | ICD-10-CM

## 2023-02-26 DIAGNOSIS — Z87891 Personal history of nicotine dependence: Secondary | ICD-10-CM | POA: Diagnosis not present

## 2023-02-26 DIAGNOSIS — Z803 Family history of malignant neoplasm of breast: Secondary | ICD-10-CM | POA: Diagnosis not present

## 2023-02-26 DIAGNOSIS — Z452 Encounter for adjustment and management of vascular access device: Secondary | ICD-10-CM | POA: Insufficient documentation

## 2023-02-26 DIAGNOSIS — Z801 Family history of malignant neoplasm of trachea, bronchus and lung: Secondary | ICD-10-CM | POA: Diagnosis not present

## 2023-02-26 LAB — CBC WITH DIFFERENTIAL (CANCER CENTER ONLY)
Abs Immature Granulocytes: 0.05 10*3/uL (ref 0.00–0.07)
Basophils Absolute: 0.1 10*3/uL (ref 0.0–0.1)
Basophils Relative: 1 %
Eosinophils Absolute: 0.1 10*3/uL (ref 0.0–0.5)
Eosinophils Relative: 1 %
HCT: 40.2 % (ref 39.0–52.0)
Hemoglobin: 13.6 g/dL (ref 13.0–17.0)
Immature Granulocytes: 0 %
Lymphocytes Relative: 8 %
Lymphs Abs: 1 10*3/uL (ref 0.7–4.0)
MCH: 36 pg — ABNORMAL HIGH (ref 26.0–34.0)
MCHC: 33.8 g/dL (ref 30.0–36.0)
MCV: 106.3 fL — ABNORMAL HIGH (ref 80.0–100.0)
Monocytes Absolute: 0.8 10*3/uL (ref 0.1–1.0)
Monocytes Relative: 6 %
Neutro Abs: 11.9 10*3/uL — ABNORMAL HIGH (ref 1.7–7.7)
Neutrophils Relative %: 84 %
Platelet Count: 178 10*3/uL (ref 150–400)
RBC: 3.78 MIL/uL — ABNORMAL LOW (ref 4.22–5.81)
RDW: 12.9 % (ref 11.5–15.5)
WBC Count: 13.9 10*3/uL — ABNORMAL HIGH (ref 4.0–10.5)
nRBC: 0 % (ref 0.0–0.2)

## 2023-02-26 LAB — CMP (CANCER CENTER ONLY)
ALT: 29 U/L (ref 0–44)
AST: 36 U/L (ref 15–41)
Albumin: 4.1 g/dL (ref 3.5–5.0)
Alkaline Phosphatase: 132 U/L — ABNORMAL HIGH (ref 38–126)
Anion gap: 12 (ref 5–15)
BUN: 23 mg/dL — ABNORMAL HIGH (ref 6–20)
CO2: 20 mmol/L — ABNORMAL LOW (ref 22–32)
Calcium: 9 mg/dL (ref 8.9–10.3)
Chloride: 102 mmol/L (ref 98–111)
Creatinine: 1.25 mg/dL — ABNORMAL HIGH (ref 0.61–1.24)
GFR, Estimated: >60 mL/min (ref >60)
Glucose, Bld: 84 mg/dL (ref 70–99)
Potassium: 3.7 mmol/L (ref 3.5–5.1)
Sodium: 134 mmol/L — ABNORMAL LOW (ref 135–145)
Total Bilirubin: 1 mg/dL (ref 0.3–1.2)
Total Protein: 8.1 g/dL (ref 6.5–8.1)

## 2023-02-26 LAB — MAGNESIUM: Magnesium: 1.8 mg/dL (ref 1.7–2.4)

## 2023-02-26 MED ORDER — APIXABAN 2.5 MG PO TABS: 2.5000 mg | ORAL_TABLET | Freq: Two times a day (BID) | ORAL | 1 refills | Status: DC

## 2023-02-26 MED ORDER — ALTEPLASE 2 MG IJ SOLR
2.0000 mg | Freq: Once | INTRAMUSCULAR | Status: AC
  Administered 2023-02-26: 2 mg
  Filled 2023-02-26: qty 2

## 2023-02-26 MED ORDER — SODIUM CHLORIDE 0.9% FLUSH
10.0000 mL | Freq: Once | INTRAVENOUS | Status: AC
  Administered 2023-02-26: 10 mL via INTRAVENOUS
  Filled 2023-02-26: qty 10

## 2023-02-26 MED ORDER — HEPARIN SOD (PORK) LOCK FLUSH 100 UNIT/ML IV SOLN
500.0000 [IU] | Freq: Once | INTRAVENOUS | Status: AC
  Administered 2023-02-26: 500 [IU] via INTRAVENOUS
  Filled 2023-02-26: qty 5

## 2023-02-26 NOTE — Assessment & Plan Note (Addendum)
Unprovoked left lower extremity DVT, status post thrombolysis/thrombectomy, Nonocclusive PE  continue Eliquis to 2.5 mg twice daily. Rx sent.

## 2023-02-26 NOTE — Progress Notes (Signed)
Hematology/Oncology Progress note [phone] [phone]) [phone]    Reason for Visit  Follow up for lung cancer treatments.  ASSESSMENT & PLAN:   Cancer Staging  Squamous cell carcinoma of right lung [facility]) Staging form: Lung, AJCC 8th Edition - Clinical stage from 07/08/2020: Stage IVA (rcT4, cM1a) - Signed by Rickard Patience, MD on 07/08/2020   History of thrombosis Unprovoked left lower extremity DVT, status post thrombolysis/thrombectomy, Nonocclusive PE  continue Eliquis to 2.5 mg twice daily. Rx sent.   Squamous cell carcinoma of right lung (HCC) 07/16/2020-09/28/2020 carboplatin Taxol and Keytruda x 1  partial response -Keytruda maintenance.-04/22/2021, progression-resumed on carboplatin/Taxol/Keytruda - did not tolerate due to myelosuppression.-->  Taxol held, carboplatin/Keytruda--> CT 07/2020 progression --> 08/03/2021 started docetaxel and ramucirumab.-->confirmed to have necrotizing pneumonia pseudomonas--> chemo discontinued.  Labs are reviewed and discussed with patient. CT scan showed no signs of cancer progression. No chemo needed.  Repeat CT chest in 6 months.   Orders Placed This Encounter  Procedures   CT CHEST ABDOMEN PELVIS W CONTRAST    Standing Status:   Future    Standing Expiration Date:   02/26/2024    Order Specific Question:   If indicated for the ordered procedure, I authorize the administration of contrast media per Radiology protocol    Answer:   Yes    Order Specific Question:   Does the patient have a contrast media/X-ray dye allergy?    Answer:   No    Order Specific Question:   Preferred imaging location?    Answer:   [facility]    Order Specific Question:   If indicated for the ordered procedure, I authorize the administration of oral contrast media per Radiology protocol    Answer:   Yes   CBC with Differential (Cancer Center Only)    Standing Status:   Future    Standing Expiration Date:   02/26/2024   CMP (Cancer Center only)     Standing Status:   Future    Standing Expiration Date:   02/26/2024   Magnesium    Standing Status:   Future    Standing Expiration Date:   02/26/2024    Follow-up 6 months  All questions were answered. The patient knows to call the clinic with any problems, questions or concerns.  Rickard Patience, MD, PhD [facility] Hematology Oncology 02/26/2023     HPI  Oncology History  Squamous cell carcinoma of right lung [facility])  08/13/2018 Surgery   s/p Bronchoscopy on 06/25/2018. subcarina EBUS FNA was non diagnostic, hypocellular specimen.  08/13/18 Patient underwent right thoracotomy and wedge resection of a right upper lobe mass.  Frozen section was consistent with an inflammatory process.  On the second postop day, preliminary pathology reports high-grade malignancy.  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  08/20/2018 Patient was therefore taken back to the OR and underwent take complete lobectomy  pT1b pN0 cM0 stage I squamous cell lung cancer. Margin is negative.  Recommend observation.     08/13/2018 Initial Diagnosis   Stage I Squamous cell carcinoma of lung, right (HCC)   12/03/2018 Progression   12/03/2018 CT chest showed abnormal soft tissue in the right suprahilar region measuring up to 2.5 cm, worrisome for recurrence 12/10/2018 PET Hypermetabolic RIGHT suprahilar mass consistent with lung cancer, recurrence. 12/26/2018 s/p bronchoscopy biopsy. Confirmed local recurrence of squamous cell lung cancer.  medi port placed by Dr.Oaks. 01/20/19 MRI brain w/wo contrast: No evidence of metastatic disease   01/17/2019 - 03/07/2019 Chemotherapy  concurrent chemoradiation on 01/16/2019 Carboplatin AUC of 2 and Taxol 45 mg/m2 weekly finished in 03/05/2019    03/31/2019 Imaging   03/31/19 CT chest showed Right suprahilar lesion has decreased slightly in size in the interval since prior PET-CT.  No new or progressive findings on today's study.   04/10/2019 - 05/20/2020 Chemotherapy   04/10/2019,  patient started on durvalumab Q 2 weeks maintenance.    06/14/2020 Progression   06/14/2020 CT chest w contrast showed vidence of progressive lymphangitic spread of tumor throughout the right lung, with contralateral lung nodules with  right pleural effusion  07/01/22 MRI brain is negative for CNS involvement.  CT abdomen with contrast showed no evidence of abdominal metastatic disease PET scan was not approved by insurance-peer to peer appeal with Dr.Vipul Bhanderi    07/08/2020 Cancer Staging   Staging form: Lung, AJCC 8th Edition - Clinical stage from 07/08/2020: Stage IVA (rcT4, cM1a) - Signed by Rickard Patience, MD on 07/08/2020   07/16/2020 - 09/28/2021 Chemotherapy   Carboplatin + Paclitaxel + Pembrolizumab q21d x 4 cycles      10/15/2020 Imaging   10/15/2020 CT chest abdomen pelvis redemonstrated postoperative and postradiation appearance of the right chest with perihilar consolidation and fibrosis.  Slight interval decrease in size of the pulmonary nodules associated with interlobular septal thickening nearly complete resolution of previously seen left-sided pulmonary nodules.  Minimal irregular residual.  Right pleural effusion improved.  Consistent with treatment response.  No evidence of metastatic disease with the abdomen or pelvis.  None obstructive bilateral nephrolithiasis  - partial response to the treatment-  12/15/2020, CT chest without contrast showed resolution of lung nodules, no evidence of recurrent disease   10/19/2020 -  Chemotherapy   Keytruda monotherapy maintenance   12/30/2020 [facility] Admission   patient was admitted due to unprovoked extensive deep vein thrombosis from calf vein to the common femoral vein on the left.  Patient was started on heparin drip.  Patient underwent thrombolysis and is a colectomy on 12/31/2020.  Anticoagulation was switched to Eliquis at discharge   04/22/2021 Imaging   CT chest with contrast showed none occlusive pulmonary embolism, Interval  progression of consolidation airspace opacity in the medial right middle lobe with bandlike opacity and bronchiectasis in the perihilar right lung,-this is compatible with evolving posttreatment changes although recurrent disease anteriorly is not excluded.Interval development of numerous bilateral pulmonary nodules, measuring up to 11 mm in the right lung base.  Concerning for metastatic disease.  Tiny right pleural effusion.    04/26/2021 -  Chemotherapy   resumed on carboplatin/Taxol/Keytruda. Patient did not tolerate his chemotherapy of carboplatin/Taxol/Keytruda on 04/26/2021. Patient has developed severe anemia status post PRBC transfusion. 05/25/2021, carboplatin AUC 5 with Keytruda 06/23/2021  continued on carboplatin AUC 5 with Keytruda 07/07/2021 carboplatin with Rande Lawman      07/15/2021 Progression   07/15/2021 CT chest with contrast showed enlarged right lower lobe thick-walled cavitary lesion in the anterior aspect of the right lower lobe.  Concerning for progression.  Multiple other small pulmonary nodules are also noted elsewhere in the lungs, many of which are stable compared to prior studies.  Several are new or clearly enlarged.  The best example is an enlarging pulmonary nodule in the left upper lobe, 8 x 6 mm comparing to 4 x 3 mm on 04/22/2021   08/03/2021 - 02/14/2022 Chemotherapy   LUNG Docetaxel + Ramucirumab q21d      11/16/2021 Imaging   1. The previously noted thick-walled cavity of  concern in the right lower lobe now appears more simple and thin-walled in appearance, presumably a post infectious cavity. There is a new adjacent thick-walled cavity in the right lower lobe which is aggressive in appearance, but given the evolution of the previous lesion, this may also be of infectious etiology. 2. However, there is an enlarging mixed cystic and solid nodule in the left upper lobe which is concerning for metastatic lesion or new primary lesion. This currently measures 11 x 5 x 10  mm. Close attention on follow-up studies is recommended. 3. No definite signs of metastatic disease to the abdomen or pelvis. 4. Extensive colonic diverticulosis most involved in the sigmoid colon where there appear to be several small diverticular abscesses. one of these appears to fistulized into the wall of the urinary bladder, without frank fistulization to the lumen of the urinary bladder at this time.    02/01/2022 - 02/05/2022 Hospital Admission   Patient was admitted for HCAP, sepsis and COPD exacerbation.  Patient was treated with IV vancomycin, cefepime, prednisone, added doxycycline to cover atypicals given CT findings.  Patient was discharged with Augmentin and doxycycline and completed 7 days of course.   02/01/2022 Imaging   CT angiogram chest with and without contrast 1. Bilateral chronic pulmonary embolus but no acute pulmonary embolus. 2. Probable pulmonary arterial hypertension. 3.  Aortic Atherosclerosis (ICD10-I70.0).  Coronary atherosclerosis. 4. Improved airspace opacity along the peripheral cavitary process in the right lower lobe, currently the thin walled air cyst or cavity measures about 1.4 cm in diameter. 5. Increased peripheral tree-in-bud reticulonodular opacities in both lower lobes in the lingula, compatible with atypical infection. 6. Airway thickening and airway plugging particularly in the right lower lobe. 7. Mosaic attenuation in the lungs, stable but nonspecific.8.  Emphysema (ICD10-J43.9). 9. Stable radiation therapy related findings in the right perihilar regions. Right upper lobectomy.   02/10/2022 - 02/12/2022 Hospital Admission   Patient was admitted due to acute hypoxic respiratory failure CT findings highly suspicious for MAC Patient had AFB sputum x 3 and has follow-up appointment with ID to review results.  Patient was discharged on 2 L of nasal cannula oxygen   02/10/2022 Imaging   CT chest without contrast 1. Cavitary lesions of the anterior right lower  lobe demonstrate increased wall thickening and there is a new irregular solid pulmonary nodule of the left upper lobe. 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 2. Stable right paramediastinal postradiation change. 3. Similar bilateral mosaic attenuation, findings can be seen in setting of small airways disease or pulmonary hypertension.4. Aortic Atherosclerosis (ICD10-I70.0) and Emphysema (ICD10-J43.9).   07/17/2022 Imaging   CT chest abdomen pelvis w contrast 1. Unchanged post treatment appearance of the right chest, with dense, perihilar fibrotic bronchiectasis, consolidation, and architectural distortion. 2. Unchanged multilobulated cystic lesion of the anterior right lower lobe, with some adjacent irregular consolidation. Findings are most consistent with sequelae of prior infection. 3. Irregular nodules previously described in the left upper lobe are almost completely resolved, consistent with resolving infection or inflammation. 4. Background of very fine centrilobular and ground-glass nodularity, most concentrated in the lung apices, most commonly seen in smoking-related respiratory bronchiolitis. 5. No evidence of metastatic disease in the abdomen or pelvis.Aortic Atherosclerosis    10/20/2022 Imaging   CT chest wo contrast  1. Stable right perihilar postradiation change with no evidence of recurrent or metastatic disease.  2. Redemonstration of multiloculated cystic lesion of the anterior right lower lobe. Adjacent irregular consolidation is slightly decreased in size. Findings are likely sequela of prior infection. 3. Numerous small centrilobular pulmonary nodules which are most numerous in the upper lungs, similar to prior likely due to smoking-related respiratory bronchiolitis. 4. Aortic Atherosclerosis    03/06/2022, sputum  collected shows Pseudomonas  aeruginosa which explains his findings on the CT scan.  There were 2 types of pseudomonas colonies and they both are resistant o ciprofloxacin. patient was referred to infectious disease and was seen by Dr. Joylene Draft on 03/09/2022.  Patient was treated with meropenem and completed his course on 04/04/2022. Chemotherapy has been on hold   INTERVAL HISTORY Samuel Choi is a 57 y.o. male who has above history reviewed by me today presents for follow up visit for metastatic squamous cell carcinoma of lung. He reports feeling well. No new complaints.  Appetite is fair.  home oxygen for chronic hypoxia respiratory failure/COPD  REVIEW OF SYSTEMS:  Review of Systems  Constitutional:  Positive for fatigue. Negative for appetite change, chills, fever and unexpected weight change.  HENT:   Negative for hearing loss and voice change.   Eyes:  Negative for eye problems and icterus.  Respiratory:  Positive for shortness of breath. Negative for chest tightness and cough.   Cardiovascular:  Negative for chest pain and leg swelling.  Gastrointestinal:  Negative for abdominal distention, abdominal pain and blood in stool.  Endocrine: Negative for hot flashes.  Genitourinary:  Negative for difficulty urinating, dysuria and frequency.   Musculoskeletal:  Negative for arthralgias.  Skin:  Negative for itching and rash.  Neurological:  Negative for extremity weakness, light-headedness and numbness.  Hematological:  Negative for adenopathy. Does not bruise/bleed easily.  Psychiatric/Behavioral:  Negative for confusion.       VITALS:  Blood pressure 132/79, pulse (!) 107, temperature (!) 97.3 F (36.3 C), temperature source Tympanic, resp. rate 18, weight 136 lb 8 oz (61.9 kg), SpO2 90%.  Wt Readings from Last 3 Encounters:  02/26/23 136 lb 8 oz (61.9 kg)  11/15/22 132 lb 6.4 oz (60.1 kg)  10/26/22 133 lb 1.6 oz (60.4 kg)    Body mass index is 18.01 kg/m.  Performance  status (ECOG): 1 - Symptomatic but completely ambulatory  PHYSICAL EXAM:  Physical Exam HENT:     Head: Normocephalic and atraumatic.  Eyes:     General: No scleral icterus. Cardiovascular:     Rate and Rhythm: Normal rate.  Pulmonary:     Effort: Pulmonary effort is normal.     Breath sounds: No wheezing or rales.     Comments: Decreased breath sound bilaterally  Abdominal:     General: There is no distension.     Palpations: Abdomen is soft.     Tenderness: There is no abdominal tenderness.  Musculoskeletal:        General: No swelling.     Cervical back: Normal range of motion.  Skin:    General: Skin is warm and dry.  Neurological:     General: No focal deficit present.     Mental Status: He is alert. Mental status is at baseline.  Psychiatric:        Mood and Affect: Mood normal.     LABS:      Latest Ref Rng & Units 02/26/2023    9:55 AM 12/21/2022   12:54 PM 10/26/2022    9:33 AM  CBC  WBC 4.0 - 10.5  K/uL 13.9  11.3  11.5   Hemoglobin 13.0 - 17.0 g/dL 35.5  73.2  20.2   Hematocrit 39.0 - 52.0 % 40.2  43.3  41.3   Platelets 150 - 400 K/uL 178  205  157       Latest Ref Rng & Units 02/26/2023    9:55 AM 12/21/2022   12:54 PM 10/26/2022    9:33 AM  CMP  Glucose 70 - 99 mg/dL 84  68  70   BUN 6 - 20 mg/dL 23  14  13    Creatinine 0.61 - 1.24 mg/dL 5.42  7.06  2.37   Sodium 135 - 145 mmol/L 134  138  135   Potassium 3.5 - 5.1 mmol/L 3.7  4.6  3.4   Chloride 98 - 111 mmol/L 102  99  100   CO2 22 - 32 mmol/L 20  23  23    Calcium 8.9 - 10.3 mg/dL 9.0  9.8  9.2   Total Protein 6.5 - 8.1 g/dL 8.1  8.4  7.9   Total Bilirubin 0.3 - 1.2 mg/dL 1.0  0.5  0.6   Alkaline Phos 38 - 126 U/L 132  120  103   AST 15 - 41 U/L 36  25  27   ALT 0 - 44 U/L 29  16  15       HISTORY:   Past Medical History:  Diagnosis Date   Alcohol abuse    usually drinks 2-3 drinks per day   Atherosclerosis 06/2018   Cancer associated pain    Chronic sinusitis    Dehydration  02/07/2019   Emphysema of lung (HCC) 06/2018   patient unaware of this.   Hip fracture (HCC) 05/2018   no surgery   History of kidney stones 05/2018   per xray, bilateral nephrolitiasis   Hypertension    Squamous cell carcinoma of lung, right (HCC) 06/2018    Past Surgical History:  Procedure Laterality Date   BRAIN SURGERY  10/2017   nasal/sinus endoscopy. mass benign   ELECTROMAGNETIC NAVIGATION BROCHOSCOPY Right 06/25/2018   Procedure: ELECTROMAGNETIC NAVIGATION BRONCHOSCOPY;  Surgeon: Erin Fulling, MD;  Location: [facility];  Service: Cardiopulmonary;  Laterality: Right;   ENDOBRONCHIAL ULTRASOUND Right 06/25/2018   Procedure: ENDOBRONCHIAL ULTRASOUND;  Surgeon: Erin Fulling, MD;  Location: [facility];  Service: Cardiopulmonary;  Laterality: Right;   ENDOBRONCHIAL ULTRASOUND Right 12/26/2018   Procedure: ENDOBRONCHIAL ULTRASOUND RIGHT;  Surgeon: Erin Fulling, MD;  Location: [facility];  Service: Cardiopulmonary;  Laterality: Right;   FLEXIBLE BRONCHOSCOPY N/A 08/20/2018   Procedure: FLEXIBLE BRONCHOSCOPY PREOP;  Surgeon: Hulda Marin, MD;  Location: [facility];  Service: Thoracic;  Laterality: N/A;   IR CV LINE INJECTION  04/04/2019   NASAL SINUS SURGERY  10/2017   At [facility], frontal sinusotomy, ethmoidectomy, resection anterior cranial fossa neoplasm, turbinate resection   PERIPHERAL VASCULAR THROMBECTOMY Left 12/31/2020   Procedure: PERIPHERAL VASCULAR THROMBECTOMY;  Surgeon: Annice Needy, MD;  Location: [facility];  Service: Cardiovascular;  Laterality: Left;   PORTACATH PLACEMENT Left 01/15/2019   Procedure: INSERTION PORT-A-CATH;  Surgeon: Hulda Marin, MD;  Location: [facility];  Service: General;  Laterality: Left;   THORACOTOMY Right 08/13/2018   Procedure: PREOP BROCHOSCOPY WITH RIGHT THORACOTOMY AND RUL RESECTION;  Surgeon: Hulda Marin, MD;  Location: [facility];  Service: General;  Laterality: Right;   THORACOTOMY Right 08/20/2018   Procedure: THORACOTOMY MAJOR RIGHT UPPER LOBE  LOBECTOMY;  Surgeon: Hulda Marin, MD;  Location: [facility];  Service:  Thoracic;  Laterality: Right;   TOE SURGERY Left    pin in left toe    Family History  Problem Relation Age of Onset   Breast cancer Mother    Diabetes Mother    Lung cancer Father    Hypertension Father     Social History:  reports that he quit smoking about 4 years ago. His smoking use included cigarettes. He started smoking about 19 years ago. He has a 7.5 pack-year smoking history. He has never used smokeless tobacco. He reports current alcohol use of about 3.0 standard drinks of alcohol per week. He reports that he does not use drugs.  Allergies: No Known Allergies  Current Medications: Current Outpatient Medications  Medication Sig Dispense Refill   albuterol (PROVENTIL) (2.5 MG/3ML) 0.083% nebulizer solution Take 3 mLs (2.5 mg total) by nebulization every 6 (six) hours as needed for wheezing or shortness of breath. 120 mL 5   albuterol (VENTOLIN HFA) 108 (90 Base) MCG/ACT inhaler INHALE 2 PUFFS BY MOUTH EVERY 6 HOURS AS NEEDED FOR WHEEZING OR SHORTNESS OF BREATH 8.5 g 5   benzonatate (TESSALON) 100 MG capsule Take 1 capsule (100 mg total) by mouth 3 (three) times daily as needed for cough. 20 capsule 2   Budeson-Glycopyrrol-Formoterol (BREZTRI AEROSPHERE) 160-9-4.8 MCG/ACT AERO Inhale 2 puffs into the lungs in the morning and at bedtime. 10.7 g 11   Budeson-Glycopyrrol-Formoterol (BREZTRI AEROSPHERE) 160-9-4.8 MCG/ACT AERO Inhale 2 puffs into the lungs in the morning and at bedtime. 11.8 g 0   cloNIDine (CATAPRES) 0.1 MG tablet Take by mouth.     cyanocobalamin (VITAMIN B12) 1000 MCG tablet Take 1 tablet (1,000 mcg total) by mouth daily. 90 tablet 1   diltiazem (CARDIZEM CD) 120 MG 24 hr capsule Take 120 mg by mouth daily.     feeding supplement (ENSURE ENLIVE / ENSURE PLUS) LIQD Take 237 mLs by mouth 3 (three) times daily between meals. 237 mL 12   hydrALAZINE (APRESOLINE) 50 MG tablet Take 50 mg by mouth 2  (two) times daily.     latanoprost (XALATAN) 0.005 % ophthalmic solution Place 1 drop into both eyes at bedtime.     loratadine (CLARITIN) 10 MG tablet TAKE 1 TABLET(10 MG) BY MOUTH DAILY AS NEEDED FOR ALLERGIES 90 tablet 1   magnesium chloride (SLOW-MAG) 64 MG TBEC SR tablet Take 1 tablet (64 mg total) by mouth daily. 90 tablet 1   methocarbamol (ROBAXIN) 500 MG tablet Take 1 tablet (500 mg total) by mouth at bedtime. 30 tablet 0   metoprolol succinate (TOPROL-XL) 25 MG 24 hr tablet Take 1 tablet (25 mg total) by mouth daily. 30 tablet 2   Multiple Vitamin (MULTIVITAMIN WITH MINERALS) TABS tablet Take 1 tablet by mouth daily.     ondansetron (ZOFRAN) 8 MG tablet Take 1 tablet (8 mg total) by mouth every 8 (eight) hours as needed for refractory nausea / vomiting. Start on day 3 after chemo. 90 tablet 1   pantoprazole (PROTONIX) 20 MG tablet TAKE 1 TABLET BY MOUTH DAILY 100 tablet 2   potassium chloride SA (KLOR-CON M) 20 MEQ tablet Take 1 tablet (20 mEq total) by mouth daily. 3 tablet 0   apixaban (ELIQUIS) 2.5 MG TABS tablet Take 1 tablet (2.5 mg total) by mouth 2 (two) times daily. 180 tablet 1   No current facility-administered medications for this visit.   Facility-Administered Medications Ordered in Other Visits  Medication Dose Route Frequency Provider Last Rate Last Admin   dexamethasone (DECADRON)  10 MG/ML injection            heparin lock flush 100 unit/mL  500 Units Intravenous Once Rickard Patience, MD       heparin lock flush 100 unit/mL  500 Units Intravenous Once Rickard Patience, MD       sodium chloride flush (NS) 0.9 % injection 10 mL  10 mL Intravenous PRN Rickard Patience, MD   10 mL at 04/04/19 0903   sodium chloride flush (NS) 0.9 % injection 10 mL  10 mL Intravenous PRN Rickard Patience, MD   10 mL at 07/26/20 1308   sodium chloride flush (NS) 0.9 % injection 10 mL  10 mL Intravenous PRN Rickard Patience, MD   10 mL at 07/28/21 6213   sodium chloride flush (NS) 0.9 % injection 10 mL  10 mL Intravenous PRN Rickard Patience, MD   10 mL at 12/21/22 1308       IMAGE STUDIES:  CT CHEST ABDOMEN PELVIS W CONTRAST  Result Date: 02/23/2023 CLINICAL DATA:  squamous cell carcinoma of right lung. Status post thoracotomy and wedge resection of right upper lobe lung mass in 2020. Status post chemotherapy and radiation therapy. On immunotherapy. Asymptomatic. * Tracking Code: BO * EXAM: CT CHEST, ABDOMEN, AND PELVIS WITH CONTRAST TECHNIQUE: Multidetector CT imaging of the chest, abdomen and pelvis was performed following the standard protocol during bolus administration of intravenous contrast. RADIATION DOSE REDUCTION: This exam was performed according to the departmental dose-optimization program which includes automated exposure control, adjustment of the mA and/or kV according to patient size and/or use of iterative reconstruction technique. CONTRAST:  85mL OMNIPAQUE IOHEXOL 300 MG/ML  SOLN COMPARISON:  10/19/2022 chest CT. Most recent abdominopelvic CT 07/17/2022. FINDINGS: CT CHEST FINDINGS Cardiovascular: Right Port-A-Cath tip low SVC. Aortic atherosclerosis. Normal heart size with minimal pericardial fluid, likely physiologic. Lad and left circumflex coronary artery calcification. No central pulmonary embolism, on this non-dedicated study. Pulmonary artery enlargement, outflow tract 3.3 cm. Mediastinum/Nodes: No supraclavicular adenopathy. No middle mediastinal or hilar adenopathy. Upper normal prevascular node of 7 mm is unchanged and favored to be reactive. Upper esophageal fluid level on 15/2. Lungs/Pleura: Trace right pleural thickening and fluid are unchanged. Moderate centrilobular emphysema.  Right upper lobectomy. Similar appearance of right medial lung/perihilar volume loss, consolidation, and traction bronchiectasis indicative of radiation fibrosis. No local recurrence. Right lower lobe probable nodular scarring laterally including at 1.3 x 0.6 cm on 85/3. Adjacent cystic bronchiectasis medially. Minimal motion  degradation inferiorly. Diffuse, somewhat ill-defined centrilobular micronodularity is likely smoking related respiratory bronchiolitis. Similar. Musculoskeletal: No acute osseous abnormality. CT ABDOMEN PELVIS FINDINGS Hepatobiliary: Minimal motion degradation continuing into the upper abdomen. Mild hepatic steatosis. Normal gallbladder, without biliary ductal dilatation. Pancreas: Normal, without mass or ductal dilatation. Spleen: Normal in size, without focal abnormality. Adrenals/Urinary Tract: Normal adrenal glands. Mild renal cortical thinning bilaterally. 9 mm interpolar left renal lesion is too small to characterize but most likely a cyst . In the absence of clinically indicated signs/symptoms require(s) no independent follow-up. No hydronephrosis. Normal urinary bladder. Stomach/Bowel: Proximal gastric underdistention. Scattered colonic diverticula. Normal terminal ileum and appendix. Normal small bowel. Vascular/Lymphatic: Aortic atherosclerosis. No abdominopelvic adenopathy. Reproductive: Normal prostate. Other: No significant free fluid. No evidence of omental or peritoneal disease. Musculoskeletal: Trace L5-S1 anterolisthesis. Loss of intervertebral disc height at this level. IMPRESSION: 1. Status post right upper lobectomy with radiation fibrosis in the medial right lung. 2. No findings of recurrent or metastatic disease, given limitation of minimal motion degradation. 3.  Similar probable nodular scarring in the right lower lobe adjacent to cystic bronchiectasis. 4. Incidental findings, including: Aortic atherosclerosis (ICD10-I70.0), coronary artery atherosclerosis and emphysema (ICD10-J43.9). Hepatic steatosis Esophageal air fluid level suggests dysmotility or gastroesophageal reflux. Pulmonary artery enlargement suggests pulmonary arterial hypertension. Electronically Signed   By: Jeronimo Greaves M.D.   On: 02/23/2023 11:16

## 2023-02-26 NOTE — Assessment & Plan Note (Signed)
07/16/2020-09/28/2020 carboplatin Taxol and Keytruda x 1  partial response -Keytruda maintenance.-04/22/2021, progression-resumed on carboplatin/Taxol/Keytruda - did not tolerate due to myelosuppression.-->  Taxol held, carboplatin/Keytruda--> CT 07/2020 progression --> 08/03/2021 started docetaxel and ramucirumab.-->confirmed to have necrotizing pneumonia pseudomonas--> chemo discontinued.  Labs are reviewed and discussed with patient. CT scan showed no signs of cancer progression. No chemo needed.  Repeat CT chest in 6 months.

## 2023-03-01 ENCOUNTER — Ambulatory Visit: Payer: Medicare Other | Admitting: Oncology

## 2023-03-30 ENCOUNTER — Other Ambulatory Visit: Payer: Self-pay | Admitting: Hospice and Palliative Medicine

## 2023-04-09 ENCOUNTER — Inpatient Hospital Stay: Payer: Medicare Other | Attending: Oncology

## 2023-04-10 ENCOUNTER — Encounter: Payer: Self-pay | Admitting: Pulmonary Disease

## 2023-04-10 ENCOUNTER — Ambulatory Visit (INDEPENDENT_AMBULATORY_CARE_PROVIDER_SITE_OTHER): Payer: Medicare Other | Admitting: Pulmonary Disease

## 2023-04-10 VITALS — BP 100/66 | HR 85 | Temp 98.0°F | Ht 73.0 in | Wt 133.8 lb

## 2023-04-10 DIAGNOSIS — J449 Chronic obstructive pulmonary disease, unspecified: Secondary | ICD-10-CM

## 2023-04-10 DIAGNOSIS — C3491 Malignant neoplasm of unspecified part of right bronchus or lung: Secondary | ICD-10-CM

## 2023-04-10 DIAGNOSIS — I071 Rheumatic tricuspid insufficiency: Secondary | ICD-10-CM | POA: Diagnosis not present

## 2023-04-10 DIAGNOSIS — I272 Pulmonary hypertension, unspecified: Secondary | ICD-10-CM

## 2023-04-10 DIAGNOSIS — I2729 Other secondary pulmonary hypertension: Secondary | ICD-10-CM | POA: Diagnosis not present

## 2023-04-10 DIAGNOSIS — G4736 Sleep related hypoventilation in conditions classified elsewhere: Secondary | ICD-10-CM

## 2023-04-10 NOTE — Progress Notes (Signed)
Subjective:    Patient ID: Samuel Choi, male    DOB: May 10, 1966, 57 y.o.   MRN: [id number]  Patient Care Team: Barbette Reichmann, MD as PCP - General (Internal Medicine) Rickard Patience, MD as Consulting Physician (Hematology and Oncology) Lynn Ito, MD as Consulting Physician (Infectious Diseases) Borders, Daryl Eastern, NP as Nurse Practitioner (Hospice and Palliative Medicine) Salena Saner, MD as Consulting Physician (Pulmonary Disease)  Chief Complaint  Patient presents with   Follow-up    No cough, shortness of breath or wheezing.     HPI Samuel Choi is a very complex 57 year old former smoker, with a history as noted below, who presents for follow-up on the issue of abnormal imaging of the lung in the setting of squamous cell carcinoma of the right lung.  He was noted to have cavitary lesions in the lung on the right.  He had been worked up while during a hospitalization to [facility] but only sputum for MTB probe was obtained.  Patient was seen on 01 March 2022 at the clinic, and at that time he was still producing quite a bit of sputum. His cavitary lesions on the right lower lobe appeared to progress significantly and rapidly.  This was consistent with an infectious process.  Microbiology of the sputum collected on 03 March 2022 showed that the patient had 2 different strains of Pseudomonas that were quite resistant.  He was referred to the infectious disease clinic and Dr. Rivka Safer saw the patient and started him on IV meropenem.  After completion of meropenem, a follow-up chest CT on 11 April 2022 showed significant improvement on the cavitary process.  At a prior visit on 17 May 2022 he had endorsed dyspnea on exertion and an ambulatory oximetry showed that he had oxygen desaturations to 86% during ambulation.  He qualified for supplemental oxygen.  He was last seen here on 17 August 2022 and had to be requalified for oxygen due to [employer] not providing him with the proper  supplemental oxygen.  He requalified for supplemental oxygen during the February visit.  He has been on oxygen at 4 L/min with exertion and 2 L/min nocturnally.  Cough has been rare and nonproductive since he completed antibiotics.  No hemoptysis. He has not had issues with diarrhea (recall that he had an issue with C. difficile previously).  No orthopnea or paroxysmal nocturnal dyspnea.  No lower extremity edema and no calf tenderness.  He notes his appetite and food intake are good.  His shortness of breath has improved dramatically.  At his prior visit on 15 Nov 2022 he was switched to [facility] 2 puffs twice a day.  He is compliant with this.  Notes that this medication relieves his shortness of breath.  He is not exhibiting any breathlessness with ambulation today.  He is compliant with oxygen at 2 L/min nocturnally.  He does have significant pulmonary hypertension.   We reviewed the CT chest performed stay in August 2024, the previous changes noted with his Pseudomonas infection have cleared now leaving changes consistent with sequela of this infection which continue to improve.  There is posttreatment changes on the right chest without significant change.  Overall he is feeling much better and actually looks less frail.  He is fully ambulatory and has a very steady gait.    Echocardiogram 31 January 2023 ([facility]):LVEF > 55%, grade 1 DD, mild RV dysfunction, moderate to severe tricuspid valve insufficiency, mild mitral valve insufficiency.  Mild RA and RV enlargement, severe pulmonary  hypertension    Review of Systems A 10 point review of systems was performed and it is as noted above otherwise negative.   Patient Active Problem List   Diagnosis Date Noted   Necrotic pneumonia (HCC) 04/07/2022   Cavitary lesion of lung 02/11/2022   Acute respiratory failure (HCC) 02/10/2022   Lactic acidosis 02/10/2022   SVT (supraventricular tachycardia) (HCC) 02/02/2022   Sepsis (HCC) 02/01/2022   Hyponatremia  02/01/2022   Chronic diastolic CHF (congestive heart failure) (HCC) 02/01/2022   Macrocytic anemia 02/01/2022   Elevated serum creatinine 01/25/2022   Diverticulosis of colon 11/18/2021   History of thrombosis 11/18/2021   Weight loss 11/09/2021   C. difficile colitis 10/19/2021   Hyperkalemia 07/28/2021   Severe protein-calorie malnutrition (HCC) 06/08/2021   Antineoplastic chemotherapy induced anemia 06/08/2021   Pulmonary embolus (HCC) 06/08/2021   Chemotherapy induced nausea and vomiting 05/17/2021   Symptomatic anemia 05/17/2021   Wide-complex tachycardia    Chemotherapy induced neutropenia (HCC) 11/09/2020   COPD (chronic obstructive pulmonary disease) (HCC) 04/22/2020   Sinus tachycardia 12/18/2019   Hypomagnesemia 12/10/2019   Encounter for antineoplastic immunotherapy 11/24/2019   Ex-smoker 07/23/2019   Port-A-Cath in place 04/04/2019   Thrombocytopenia (HCC) 04/04/2019   Folate deficiency 04/04/2019   Hypokalemia 04/04/2019   B12 deficiency 04/04/2019   Alcohol abuse 04/04/2019   Dehydration 02/07/2019   Encounter for antineoplastic chemotherapy 01/31/2019   Squamous cell carcinoma of right lung (HCC) 09/03/2018   Goals of care, counseling/discussion 09/03/2018   Malnutrition of moderate degree 08/23/2018   Emphysema of lung (HCC) 06/2018   Allergic rhinitis 06/2018   H/O nasal polyp 02/20/2018   Acute sinusitis 10/10/2017   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/10/2017   Chronic rhinitis 10/10/2017   Mass of sinus 10/10/2017   Essential hypertension 09/27/2017   Tobacco abuse 09/27/2017    Social History   Tobacco Use   Smoking status: Former    Current packs/day: 0.00    Average packs/day: 0.5 packs/day for 15.0 years (7.5 ttl pk-yrs)    Types: Cigarettes    Start date: 08/2003    Quit date: 08/2018    Years since quitting: 4.6   Smokeless tobacco: Never  Substance Use Topics   Alcohol use: Yes    Alcohol/week: 3.0 standard drinks of alcohol    Types: 3 Cans of  beer per week    Comment: usually 2 drinks per week, per patient    No Known Allergies  Current Meds  Medication Sig   albuterol (PROVENTIL) (2.5 MG/3ML) 0.083% nebulizer solution Take 3 mLs (2.5 mg total) by nebulization every 6 (six) hours as needed for wheezing or shortness of breath.   albuterol (VENTOLIN HFA) 108 (90 Base) MCG/ACT inhaler INHALE 2 PUFFS BY MOUTH EVERY 6 HOURS AS NEEDED FOR WHEEZING OR SHORTNESS OF BREATH   apixaban (ELIQUIS) 2.5 MG TABS tablet Take 1 tablet (2.5 mg total) by mouth 2 (two) times daily.   benzonatate (TESSALON) 100 MG capsule Take 1 capsule (100 mg total) by mouth 3 (three) times daily as needed for cough.   Budeson-Glycopyrrol-Formoterol (BREZTRI AEROSPHERE) 160-9-4.8 MCG/ACT AERO Inhale 2 puffs into the lungs in the morning and at bedtime.   Budeson-Glycopyrrol-Formoterol (BREZTRI AEROSPHERE) 160-9-4.8 MCG/ACT AERO Inhale 2 puffs into the lungs in the morning and at bedtime.   cloNIDine (CATAPRES) 0.1 MG tablet Take by mouth.   cyanocobalamin (VITAMIN B12) 1000 MCG tablet Take 1 tablet (1,000 mcg total) by mouth daily.   diltiazem (CARDIZEM CD) 120 MG 24 hr  capsule Take 120 mg by mouth daily.   feeding supplement (ENSURE ENLIVE / ENSURE PLUS) LIQD Take 237 mLs by mouth 3 (three) times daily between meals.   hydrALAZINE (APRESOLINE) 50 MG tablet Take 50 mg by mouth 2 (two) times daily.   latanoprost (XALATAN) 0.005 % ophthalmic solution Place 1 drop into both eyes at bedtime.   loratadine (CLARITIN) 10 MG tablet TAKE 1 TABLET(10 MG) BY MOUTH DAILY AS NEEDED FOR ALLERGIES   magnesium chloride (SLOW-MAG) 64 MG TBEC SR tablet Take 1 tablet (64 mg total) by mouth daily.   methocarbamol (ROBAXIN) 500 MG tablet Take 1 tablet (500 mg total) by mouth at bedtime.   metoprolol succinate (TOPROL-XL) 25 MG 24 hr tablet Take 1 tablet (25 mg total) by mouth daily.   Multiple Vitamin (MULTIVITAMIN WITH MINERALS) TABS tablet Take 1 tablet by mouth daily.   ondansetron  (ZOFRAN) 8 MG tablet Take 1 tablet (8 mg total) by mouth every 8 (eight) hours as needed for refractory nausea / vomiting. Start on day 3 after chemo.   pantoprazole (PROTONIX) 20 MG tablet TAKE 1 TABLET BY MOUTH DAILY   potassium chloride SA (KLOR-CON M) 20 MEQ tablet Take 1 tablet (20 mEq total) by mouth daily.    Immunization History  Administered Date(s) Administered   Influenza Inj Mdck Quad Pf 03/28/2019   Influenza, High Dose Seasonal PF 03/28/2019   Influenza-Unspecified 03/31/2022   Moderna Sars-Covid-2 Vaccination 11/14/2019   Pneumococcal Conjugate-13 02/17/2021   Pneumococcal Polysaccharide-23 11/19/2018   Tdap 12/31/2017   Zoster Recombinant(Shingrix) 03/28/2019, 05/01/2019, 06/09/2019        Objective:     BP 100/66 (BP Location: Left Arm, Cuff Size: Normal)   Pulse 85   Temp 98 F (36.7 C) (Temporal)   Ht 6\' 1"  (1.854 m)   Wt 133 lb 12.8 oz (60.7 kg)   SpO2 96%   BMI 17.65 kg/m   SpO2: 96 % O2 Device: None (Room air)  GENERAL: Thin, gentleman, presents fully ambulatory.  No acute distress. HEAD: Normocephalic, atraumatic.  Alopecia due to chemotherapy. EYES: Pupils equal, round, reactive to light.  No scleral icterus.  Disconjugate gaze (exotropia of right eye). MOUTH: Poor dentition, oral mucosa moist.  No thrush. NECK: Supple. No thyromegaly. Trachea midline. No JVD.  No adenopathy. PULMONARY: Good air entry bilaterally.  Coarse, otherwise, clear to auscultation bilaterally. CARDIOVASCULAR: S1 and S2.  Regular rate and rhythm.  No rubs, murmurs or gallops heard. ABDOMEN: Scaphoid, otherwise benign. MUSCULOSKELETAL: No joint deformity, no clubbing, no edema.  Significant sarcopenia. NEUROLOGIC: Fully ambulatory today, occasional disconjugate gaze.  Otherwise no focality. SKIN: Intact,warm,dry. PSYCH: Mood and behavior normal.   Representative image from CT chest performed 23 February 2023 nodular scarring on the right lung.  Residual from prior  Pseudomonas infection:       Assessment & Plan:     ICD-10-CM   1. Stage 2 moderate COPD by GOLD classification (HCC)  J44.9    Continue Breztri 2 puffs twice a day Continue as needed albuterol    2. 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 Severe tricuspid valve insufficiency  I07.1    Query post radiation Right ventricle only  mildly enlarged Unfortunately no echo prior to starting chemo/radiation previously    5. Pulmonary hypertension, moderate to severe (HCC)  I27.20    Query post chemo Continue nocturnal O2     Patient appears to be well compensated with regards to his COPD.  Chest imaging does not show any evidence of cancer recurrence.  Continue monitoring as per oncology.  The patient will follow-up in 6 months time call sooner should any new problems arise.     Gailen Shelter, MD Advanced Bronchoscopy PCCM [facility]    *This note was dictated using voice recognition software/Dragon.  Despite best efforts to proofread, errors can occur which can change the meaning. Any transcriptional errors that result from this process are unintentional and may not be fully corrected at the time of dictation.

## 2023-04-10 NOTE — Patient Instructions (Addendum)
Will see the patient in follow-up lungs sounded really clear today.  Your most recent CT did not show any evidence of cancer coming back.  It also showed that the infection has completely cleared.  Will see you in follow-up in 6 months time call sooner should any new problems arise.

## 2023-04-16 ENCOUNTER — Telehealth: Payer: Self-pay | Admitting: *Deleted

## 2023-04-16 NOTE — Telephone Encounter (Signed)
Received Disability form for patient. Form completed and sent to physician for signature and final completion

## 2023-04-17 ENCOUNTER — Encounter: Payer: Self-pay | Admitting: Oncology

## 2023-04-17 NOTE — Telephone Encounter (Signed)
Form signed and completed and copied for chart Original put at front desk for patient to pick up I called patient to inform of ready fopr pick up and he requested I fax the form to [employer] [facility]. FOrm faxed

## 2023-04-18 ENCOUNTER — Other Ambulatory Visit: Payer: Medicare Other

## 2023-04-18 DIAGNOSIS — R972 Elevated prostate specific antigen [PSA]: Secondary | ICD-10-CM

## 2023-04-19 LAB — PSA: Prostate Specific Ag, Serum: 15.6 ng/mL — ABNORMAL HIGH (ref 0.0–4.0)

## 2023-04-20 ENCOUNTER — Other Ambulatory Visit: Payer: Self-pay

## 2023-04-20 DIAGNOSIS — R972 Elevated prostate specific antigen [PSA]: Secondary | ICD-10-CM

## 2023-04-27 ENCOUNTER — Encounter: Payer: Self-pay | Admitting: Pulmonary Disease

## 2023-05-16 ENCOUNTER — Other Ambulatory Visit: Payer: Medicare Other

## 2023-05-16 DIAGNOSIS — R972 Elevated prostate specific antigen [PSA]: Secondary | ICD-10-CM

## 2023-05-17 LAB — PSA: Prostate Specific Ag, Serum: 15.6 ng/mL — ABNORMAL HIGH (ref 0.0–4.0)

## 2023-05-21 ENCOUNTER — Inpatient Hospital Stay: Payer: Medicare Other | Attending: Oncology

## 2023-05-21 DIAGNOSIS — C3491 Malignant neoplasm of unspecified part of right bronchus or lung: Secondary | ICD-10-CM | POA: Diagnosis present

## 2023-05-21 DIAGNOSIS — Z95828 Presence of other vascular implants and grafts: Secondary | ICD-10-CM

## 2023-05-21 DIAGNOSIS — Z452 Encounter for adjustment and management of vascular access device: Secondary | ICD-10-CM | POA: Diagnosis present

## 2023-05-21 MED ORDER — HEPARIN SOD (PORK) LOCK FLUSH 100 UNIT/ML IV SOLN
500.0000 [IU] | Freq: Once | INTRAVENOUS | Status: AC
  Administered 2023-05-21: 500 [IU] via INTRAVENOUS
  Filled 2023-05-21: qty 5

## 2023-05-21 MED ORDER — SODIUM CHLORIDE 0.9% FLUSH
10.0000 mL | Freq: Once | INTRAVENOUS | Status: AC
  Administered 2023-05-21: 10 mL via INTRAVENOUS
  Filled 2023-05-21: qty 10

## 2023-06-14 ENCOUNTER — Emergency Department: Payer: Medicare Other

## 2023-06-14 ENCOUNTER — Observation Stay
Admission: EM | Admit: 2023-06-14 | Discharge: 2023-06-15 | Disposition: A | Discharge status: Home / Self Care | Payer: Medicare Other | Attending: Student in an Organized Health Care Education/Training Program | Admitting: Student in an Organized Health Care Education/Training Program

## 2023-06-14 ENCOUNTER — Other Ambulatory Visit: Payer: Self-pay

## 2023-06-14 DIAGNOSIS — Z1152 Encounter for screening for COVID-19: Secondary | ICD-10-CM | POA: Diagnosis not present

## 2023-06-14 DIAGNOSIS — Z86711 Personal history of pulmonary embolism: Secondary | ICD-10-CM | POA: Diagnosis not present

## 2023-06-14 DIAGNOSIS — Z91198 Patient's noncompliance with other medical treatment and regimen for other reason: Secondary | ICD-10-CM | POA: Diagnosis not present

## 2023-06-14 DIAGNOSIS — D539 Nutritional anemia, unspecified: Secondary | ICD-10-CM | POA: Diagnosis not present

## 2023-06-14 DIAGNOSIS — I110 Hypertensive heart disease with heart failure: Secondary | ICD-10-CM | POA: Insufficient documentation

## 2023-06-14 DIAGNOSIS — Z7901 Long term (current) use of anticoagulants: Secondary | ICD-10-CM | POA: Insufficient documentation

## 2023-06-14 DIAGNOSIS — J9621 Acute and chronic respiratory failure with hypoxia: Principal | ICD-10-CM

## 2023-06-14 DIAGNOSIS — Z85118 Personal history of other malignant neoplasm of bronchus and lung: Secondary | ICD-10-CM | POA: Diagnosis not present

## 2023-06-14 DIAGNOSIS — Z86718 Personal history of other venous thrombosis and embolism: Secondary | ICD-10-CM | POA: Insufficient documentation

## 2023-06-14 DIAGNOSIS — Z79899 Other long term (current) drug therapy: Secondary | ICD-10-CM | POA: Diagnosis not present

## 2023-06-14 DIAGNOSIS — I509 Heart failure, unspecified: Secondary | ICD-10-CM | POA: Insufficient documentation

## 2023-06-14 DIAGNOSIS — J449 Chronic obstructive pulmonary disease, unspecified: Secondary | ICD-10-CM | POA: Insufficient documentation

## 2023-06-14 DIAGNOSIS — J441 Chronic obstructive pulmonary disease with (acute) exacerbation: Secondary | ICD-10-CM

## 2023-06-14 DIAGNOSIS — Z87891 Personal history of nicotine dependence: Secondary | ICD-10-CM | POA: Diagnosis not present

## 2023-06-14 DIAGNOSIS — R0602 Shortness of breath: Secondary | ICD-10-CM | POA: Diagnosis present

## 2023-06-14 DIAGNOSIS — R0902 Hypoxemia: Secondary | ICD-10-CM | POA: Diagnosis present

## 2023-06-14 LAB — RESPIRATORY PANEL BY PCR

## 2023-06-14 LAB — CBC
HCT: 37 % — ABNORMAL LOW (ref 39.0–52.0)
Hemoglobin: 12.4 g/dL — ABNORMAL LOW (ref 13.0–17.0)
MCH: 34.9 pg — ABNORMAL HIGH (ref 26.0–34.0)
MCHC: 33.5 g/dL (ref 30.0–36.0)
MCV: 104.2 fL — ABNORMAL HIGH (ref 80.0–100.0)
Platelets: 179 10*3/uL (ref 150–400)
RBC: 3.55 MIL/uL — ABNORMAL LOW (ref 4.22–5.81)
RDW: 15 % (ref 11.5–15.5)
WBC: 8.6 10*3/uL (ref 4.0–10.5)
nRBC: 0 % (ref 0.0–0.2)

## 2023-06-14 LAB — COMPREHENSIVE METABOLIC PANEL
ALT: 13 U/L (ref 0–44)
AST: 26 U/L (ref 15–41)
Albumin: 3 g/dL — ABNORMAL LOW (ref 3.5–5.0)
Alkaline Phosphatase: 106 U/L (ref 38–126)
Anion gap: 14 (ref 5–15)
BUN: 13 mg/dL (ref 6–20)
CO2: 24 mmol/L (ref 22–32)
Calcium: 8.6 mg/dL — ABNORMAL LOW (ref 8.9–10.3)
Chloride: 106 mmol/L (ref 98–111)
Creatinine, Ser: 0.92 mg/dL (ref 0.61–1.24)
GFR, Estimated: >60 mL/min (ref >60)
Glucose, Bld: 81 mg/dL (ref 70–99)
Potassium: 3.7 mmol/L (ref 3.5–5.1)
Sodium: 144 mmol/L (ref 135–145)
Total Bilirubin: 1.3 mg/dL — ABNORMAL HIGH (ref <1.2)
Total Protein: 7.1 g/dL (ref 6.5–8.1)

## 2023-06-14 LAB — SARS CORONAVIRUS 2 BY RT PCR
SARS Coronavirus 2 by RT PCR: NEGATIVE
SARS Coronavirus 2 by RT PCR: NEGATIVE

## 2023-06-14 LAB — BRAIN NATRIURETIC PEPTIDE: B Natriuretic Peptide: 613.8 pg/mL — ABNORMAL HIGH (ref 0.0–100.0)

## 2023-06-14 LAB — PROTIME-INR
INR: 1.1 (ref 0.8–1.2)
Prothrombin Time: 14.7 s (ref 11.4–15.2)

## 2023-06-14 LAB — TROPONIN I (HIGH SENSITIVITY)
Troponin I (High Sensitivity): 20 ng/L — ABNORMAL HIGH (ref <18)
Troponin I (High Sensitivity): 21 ng/L — ABNORMAL HIGH (ref <18)

## 2023-06-14 MED ORDER — IOHEXOL 350 MG/ML SOLN
75.0000 mL | Freq: Once | INTRAVENOUS | Status: AC | PRN
  Administered 2023-06-14: 75 mL via INTRAVENOUS

## 2023-06-14 MED ORDER — CLONIDINE HCL 0.1 MG PO TABS
0.1000 mg | ORAL_TABLET | Freq: Two times a day (BID) | ORAL | Status: DC
  Administered 2023-06-14 – 2023-06-15 (×2): 0.1 mg via ORAL
  Filled 2023-06-14 (×2): qty 1

## 2023-06-14 MED ORDER — ENOXAPARIN SODIUM 40 MG/0.4ML IJ SOSY
40.0000 mg | PREFILLED_SYRINGE | INTRAMUSCULAR | Status: DC
  Administered 2023-06-14: 40 mg via SUBCUTANEOUS
  Filled 2023-06-14: qty 0.4

## 2023-06-14 MED ORDER — FUROSEMIDE 10 MG/ML IJ SOLN
40.0000 mg | Freq: Two times a day (BID) | INTRAMUSCULAR | Status: AC
  Administered 2023-06-15: 40 mg via INTRAVENOUS
  Filled 2023-06-14: qty 4

## 2023-06-14 MED ORDER — IPRATROPIUM-ALBUTEROL 0.5-2.5 (3) MG/3ML IN SOLN
3.0000 mL | Freq: Once | RESPIRATORY_TRACT | Status: AC
  Administered 2023-06-14: 3 mL via RESPIRATORY_TRACT
  Filled 2023-06-14: qty 3

## 2023-06-14 MED ORDER — PREDNISONE 20 MG PO TABS
40.0000 mg | ORAL_TABLET | Freq: Every day | ORAL | Status: DC
  Administered 2023-06-15: 40 mg via ORAL
  Filled 2023-06-14: qty 2

## 2023-06-14 MED ORDER — METHYLPREDNISOLONE SODIUM SUCC 125 MG IJ SOLR
125.0000 mg | Freq: Once | INTRAMUSCULAR | Status: AC
Start: 2023-06-14 — End: 2023-06-14
  Administered 2023-06-14: 125 mg via INTRAVENOUS
  Filled 2023-06-14: qty 2

## 2023-06-14 MED ORDER — ACETAMINOPHEN 325 MG PO TABS: 650.0000 mg | ORAL_TABLET | Freq: Four times a day (QID) | ORAL | Status: DC | PRN

## 2023-06-14 MED ORDER — APIXABAN 2.5 MG PO TABS
2.5000 mg | ORAL_TABLET | Freq: Two times a day (BID) | ORAL | Status: DC
  Administered 2023-06-14 – 2023-06-15 (×2): 2.5 mg via ORAL
  Filled 2023-06-14 (×2): qty 1

## 2023-06-14 MED ORDER — POLYETHYLENE GLYCOL 3350 17 G PO PACK
17.0000 g | PACK | Freq: Every day | ORAL | Status: DC
  Administered 2023-06-15: 17 g via ORAL
  Filled 2023-06-14: qty 1

## 2023-06-14 MED ORDER — IPRATROPIUM-ALBUTEROL 0.5-2.5 (3) MG/3ML IN SOLN
3.0000 mL | Freq: Two times a day (BID) | RESPIRATORY_TRACT | Status: DC
  Administered 2023-06-14 – 2023-06-15 (×2): 3 mL via RESPIRATORY_TRACT
  Filled 2023-06-14 (×2): qty 3

## 2023-06-14 MED ORDER — METOPROLOL SUCCINATE ER 50 MG PO TB24
25.0000 mg | ORAL_TABLET | Freq: Every day | ORAL | Status: DC
  Administered 2023-06-15: 25 mg via ORAL
  Filled 2023-06-14: qty 1

## 2023-06-14 MED ORDER — FUROSEMIDE 10 MG/ML IJ SOLN: 60.0000 mg | Freq: Once | INTRAMUSCULAR | Status: DC

## 2023-06-14 MED ORDER — ALBUTEROL SULFATE (2.5 MG/3ML) 0.083% IN NEBU
3.0000 mL | INHALATION_SOLUTION | RESPIRATORY_TRACT | Status: DC | PRN
  Administered 2023-06-14: 3 mL via RESPIRATORY_TRACT
  Filled 2023-06-14: qty 3

## 2023-06-14 MED ORDER — METOPROLOL SUCCINATE ER 50 MG PO TB24: 25.0000 mg | ORAL_TABLET | Freq: Every day | ORAL | Status: DC

## 2023-06-14 MED ORDER — FUROSEMIDE 10 MG/ML IJ SOLN
40.0000 mg | Freq: Once | INTRAMUSCULAR | Status: AC
  Administered 2023-06-14: 40 mg via INTRAVENOUS
  Filled 2023-06-14: qty 4

## 2023-06-14 MED ORDER — BISACODYL 5 MG PO TBEC: 5.0000 mg | DELAYED_RELEASE_TABLET | Freq: Every day | ORAL | Status: DC | PRN

## 2023-06-14 MED ORDER — DILTIAZEM HCL ER COATED BEADS 120 MG PO CP24
120.0000 mg | ORAL_CAPSULE | Freq: Every day | ORAL | Status: DC
  Administered 2023-06-15: 120 mg via ORAL
  Filled 2023-06-14 (×2): qty 1

## 2023-06-14 MED ORDER — ACETAMINOPHEN 650 MG RE SUPP: 650.0000 mg | Freq: Four times a day (QID) | RECTAL | Status: DC | PRN

## 2023-06-14 MED ORDER — CYANOCOBALAMIN 500 MCG PO TABS
1000.0000 ug | ORAL_TABLET | Freq: Every day | ORAL | Status: DC
  Administered 2023-06-15: 1000 ug via ORAL
  Filled 2023-06-14: qty 2

## 2023-06-14 MED ORDER — AZITHROMYCIN 500 MG PO TABS
500.0000 mg | ORAL_TABLET | Freq: Every day | ORAL | Status: DC
  Administered 2023-06-14 – 2023-06-15 (×2): 500 mg via ORAL
  Filled 2023-06-14 (×2): qty 1

## 2023-06-14 MED ORDER — DILTIAZEM HCL ER COATED BEADS 120 MG PO CP24: 120.0000 mg | ORAL_CAPSULE | Freq: Every day | ORAL | Status: DC

## 2023-06-14 NOTE — H&P (Signed)
History and Physical  Samuel Choi ZOX:[id number] DOB: Jun 05, 1966 DOA: 06/14/2023 PCP: Barbette Reichmann, MD  Chief Complaint: Shortness of breath Historian: Samuel Choi and wife at bedside  HPI:  Samuel Choi is a 57 y.o. male with a PMH significant for chronic respiratory failure on 2 L at baseline, history of lung cancer with right lobectomy and currently in remission, COPD, HTN, PE not on anticoagulation, SVT, thrombocytopenia, alcohol use, CHF, macrocytic anemia, tobacco use. At baseline, they live at home with her wife and are completely independent with ADLs.  They presented from home to the ED on 06/14/2023 with worsening shortness of breath x a couple days.  Endorses that he has also had a productive cough during this time with clear sputum.  Endorses history of DVT and PE but has not been on anticoagulation due to affordability.  Endorses that he has had lower extremity swelling for couple of days.  Denies fever.  In the ED, it was found that they had hypoxia requiring increased oxygen supplementation to 6 L nasal cannula.  Heart rate 96, respiratory rate 20, blood pressure 143/99.  Significant findings included: COVID-negative.  Hemoglobin 12.4, platelets 179.  Troponin 20>21.  Na+ 144, K+ 3.7, creatinine 0.92.  BNP 613 (lower than baseline) CTA chest is negative for PE.  Further findings are suggestive of infection. They were initially treated with IV Lasix, DuoNebs, Solu-Medrol.   Samuel Choi was admitted to medicine service for further workup and management of acute on chronic hypoxic respiratory failure as outlined in detail below.  Does want CPR including chest compressions and cardioversion if needed but does not want to be intubated under any circumstances.  This was confirmed in detail with the Samuel Choi and his wife at bedside.  Assessment/Plan Principal Problem:   Hypoxia   Acute on chronic hypoxic respiratory failure-negative for PE.  Given history and exam, most likely is  bronchitis versus COPD exacerbation -Continue breathing treatments -Continue steroids -Antibiotic for COPD exacerbation -Wean back to baseline of 2 L as tolerated -Ambulate Samuel Choi as much as possible -Incentive spirometer, flutter valve -Samuel Choi appears to be euvolemic on exam but is endorsing lower extremity edema compared to baseline so we will continue another dose of IV diuretic  Possible a flutter-as seen on ECG.  Samuel Choi is asymptomatic and hemodynamically stable without symptoms. -Continue home antiarrhythmics -Repeat ECG  History of DVT, PE -Restart home Eliquis at 2.5 mg twice daily  Hypertension -Continue home medications clonidine, metoprolol, diltiazem  Macrocytic anemia -Continue home vitamin B12  Medication nonadherence-due to affordability of anticoagulation, Samuel Choi has discontinued its use. -Consult toc, pharmacy  Past Medical History:  Diagnosis Date   Alcohol abuse    usually drinks 2-3 drinks per day   Atherosclerosis 06/2018   Cancer associated pain    Chronic sinusitis    Dehydration 02/07/2019   Emphysema of lung (HCC) 06/2018   Samuel Choi unaware of this.   Hip fracture (HCC) 05/2018   no surgery   History of kidney stones 05/2018   per xray, bilateral nephrolitiasis   Hypertension    Squamous cell carcinoma of lung, right (HCC) 06/2018    Past Surgical History:  Procedure Laterality Date   BRAIN SURGERY  10/2017   nasal/sinus endoscopy. mass benign   ELECTROMAGNETIC NAVIGATION BROCHOSCOPY Right 06/25/2018   Procedure: ELECTROMAGNETIC NAVIGATION BRONCHOSCOPY;  Surgeon: Erin Fulling, MD;  Location: [facility];  Service: Cardiopulmonary;  Laterality: Right;   ENDOBRONCHIAL ULTRASOUND Right 06/25/2018   Procedure: ENDOBRONCHIAL ULTRASOUND;  Surgeon: Erin Fulling, MD;  Location: [facility];  Service: Cardiopulmonary;  Laterality: Right;   ENDOBRONCHIAL ULTRASOUND Right 12/26/2018   Procedure: ENDOBRONCHIAL ULTRASOUND RIGHT;  Surgeon: Erin Fulling, MD;   Location: [facility];  Service: Cardiopulmonary;  Laterality: Right;   FLEXIBLE BRONCHOSCOPY N/A 08/20/2018   Procedure: FLEXIBLE BRONCHOSCOPY PREOP;  Surgeon: Hulda Marin, MD;  Location: [facility];  Service: Thoracic;  Laterality: N/A;   IR CV LINE INJECTION  04/04/2019   NASAL SINUS SURGERY  10/2017   At [facility], frontal sinusotomy, ethmoidectomy, resection anterior cranial fossa neoplasm, turbinate resection   PERIPHERAL VASCULAR THROMBECTOMY Left 12/31/2020   Procedure: PERIPHERAL VASCULAR THROMBECTOMY;  Surgeon: Annice Needy, MD;  Location: [facility];  Service: Cardiovascular;  Laterality: Left;   PORTACATH PLACEMENT Left 01/15/2019   Procedure: INSERTION PORT-A-CATH;  Surgeon: Hulda Marin, MD;  Location: [facility];  Service: General;  Laterality: Left;   THORACOTOMY Right 08/13/2018   Procedure: PREOP BROCHOSCOPY WITH RIGHT THORACOTOMY AND RUL RESECTION;  Surgeon: Hulda Marin, MD;  Location: [facility];  Service: General;  Laterality: Right;   THORACOTOMY Right 08/20/2018   Procedure: THORACOTOMY MAJOR RIGHT UPPER LOBE LOBECTOMY;  Surgeon: Hulda Marin, MD;  Location: [facility];  Service: Thoracic;  Laterality: Right;   TOE SURGERY Left    pin in left toe     reports that he quit smoking about 4 years ago. His smoking use included cigarettes. He started smoking about 19 years ago. He has a 7.5 pack-year smoking history. He has never used smokeless tobacco. He reports current alcohol use of about 3.0 standard drinks of alcohol per week. He reports that he does not use drugs.  No Known Allergies  Family History  Problem Relation Age of Onset   Breast cancer Mother    Diabetes Mother    Lung cancer Father    Hypertension Father     Prior to Admission medications   Medication Sig Start Date End Date Taking? Authorizing Provider  albuterol (PROVENTIL) (2.5 MG/3ML) 0.083% nebulizer solution Take 3 mLs (2.5 mg total) by nebulization every 6 (six) hours as needed for wheezing or  shortness of breath. 03/01/22   Salena Saner, MD  albuterol (VENTOLIN HFA) 108 (90 Base) MCG/ACT inhaler INHALE 2 PUFFS BY MOUTH EVERY 6 HOURS AS NEEDED FOR WHEEZING OR SHORTNESS OF BREATH 11/15/22   Salena Saner, MD  apixaban (ELIQUIS) 2.5 MG TABS tablet Take 1 tablet (2.5 mg total) by mouth 2 (two) times daily. 02/26/23   Rickard Patience, MD  benzonatate (TESSALON) 100 MG capsule Take 1 capsule (100 mg total) by mouth 3 (three) times daily as needed for cough. 06/27/22   Borders, Daryl Eastern, NP  Budeson-Glycopyrrol-Formoterol (BREZTRI AEROSPHERE) 160-9-4.8 MCG/ACT AERO Inhale 2 puffs into the lungs in the morning and at bedtime. 11/15/22   Salena Saner, MD  Budeson-Glycopyrrol-Formoterol (BREZTRI AEROSPHERE) 160-9-4.8 MCG/ACT AERO Inhale 2 puffs into the lungs in the morning and at bedtime. 11/15/22   Salena Saner, MD  cloNIDine (CATAPRES) 0.1 MG tablet Take by mouth. 01/16/23 01/16/24  [provider]  cyanocobalamin (VITAMIN B12) 1000 MCG tablet Take 1 tablet (1,000 mcg total) by mouth daily. 06/27/22   Borders, Daryl Eastern, NP  diltiazem (CARDIZEM CD) 120 MG 24 hr capsule Take 120 mg by mouth daily. 06/19/19   [provider]  feeding supplement (ENSURE ENLIVE / ENSURE PLUS) LIQD Take 237 mLs by mouth 3 (three) times daily between meals. 02/05/22   Sunnie Nielsen, DO  hydrALAZINE (APRESOLINE) 50 MG tablet  Take 50 mg by mouth 2 (two) times daily.    [provider]  latanoprost (XALATAN) 0.005 % ophthalmic solution Place 1 drop into both eyes at bedtime. 05/04/21   [provider]  loratadine (CLARITIN) 10 MG tablet TAKE 1 TABLET(10 MG) BY MOUTH DAILY AS NEEDED FOR ALLERGIES 02/12/23   Rickard Patience, MD  magnesium chloride (SLOW-MAG) 64 MG TBEC SR tablet Take 1 tablet (64 mg total) by mouth daily. 07/24/22   Rickard Patience, MD  methocarbamol (ROBAXIN) 500 MG tablet Take 1 tablet (500 mg total) by mouth at bedtime. 06/27/22   Borders, Daryl Eastern, NP  metoprolol succinate  (TOPROL-XL) 25 MG 24 hr tablet Take 1 tablet (25 mg total) by mouth daily. 11/28/22   Rickard Patience, MD  Multiple Vitamin (MULTIVITAMIN WITH MINERALS) TABS tablet Take 1 tablet by mouth daily. 02/05/22   Sunnie Nielsen, DO  ondansetron (ZOFRAN) 8 MG tablet Take 1 tablet (8 mg total) by mouth every 8 (eight) hours as needed for refractory nausea / vomiting. Start on day 3 after chemo. 06/27/22   Borders, Daryl Eastern, NP  pantoprazole (PROTONIX) 20 MG tablet TAKE 1 TABLET BY MOUTH DAILY 02/23/23   Rickard Patience, MD  potassium chloride SA (KLOR-CON M) 20 MEQ tablet Take 1 tablet (20 mEq total) by mouth daily. 10/26/22   Rickard Patience, MD   I have personally, briefly reviewed Samuel Choi's prior medical records in [facility]  Objective: Blood pressure (!) 169/100, pulse 89, temperature 98.3 F (36.8 C), temperature source Oral, resp. rate (!) 21, height 6\' 1"  (1.854 m), weight 64 kg, SpO2 95%.   Constitutional: NAD, calm, comfortable HEENT: lids and conjunctivae normal. MMM. Posterior pharynx clear of any exudate or lesions. Normal dentition.  Neck: normal, supple, no masses, no thyromegaly Respiratory: CTAB, no wheezing, no crackles. Normal respiratory effort. No accessory muscle use.  Cardiovascular: RRR, no murmurs / rubs / gallops. No extremity edema. 2+ pedal pulses. no clubbing / cyanosis.  Abdomen: soft, NT, ND, no masses or HSM palpated. Musculoskeletal: No joint deformity upper and lower extremities. Normal muscle tone.  Skin: dry, intact, normal color, normal temperature on exposed skin Neurologic: Alert and oriented x 3. Normal speech. Grossly non-focal exam. PERRL Psychiatric: Normal mood. Congruent affect.  Labs on Admission: I have personally reviewed admission labs and imaging studies  CBC    Component Value Date/Time   WBC 8.6 06/14/2023 0847   RBC 3.55 (L) 06/14/2023 0847   HGB 12.4 (L) 06/14/2023 0847   HGB 13.6 02/26/2023 0955   HGB 15.9 11/02/2014 1859   HCT 37.0 (L) 06/14/2023 0847    HCT 46.0 11/02/2014 1859   PLT 179 06/14/2023 0847   PLT 178 02/26/2023 0955   PLT 143 (L) 11/02/2014 1859   MCV 104.2 (H) 06/14/2023 0847   MCV 100 11/02/2014 1859   MCH 34.9 (H) 06/14/2023 0847   MCHC 33.5 06/14/2023 0847   RDW 15.0 06/14/2023 0847   RDW 13.1 11/02/2014 1859   LYMPHSABS 1.0 02/26/2023 0955   LYMPHSABS 1.5 07/07/2014 0516   MONOABS 0.8 02/26/2023 0955   MONOABS 0.6 07/07/2014 0516   EOSABS 0.1 02/26/2023 0955   EOSABS 0.3 07/07/2014 0516   BASOSABS 0.1 02/26/2023 0955   BASOSABS 0.1 07/07/2014 0516   CMP     Component Value Date/Time   NA 144 06/14/2023 0847   NA 137 11/02/2014 1859   K 3.7 06/14/2023 0847   K 3.5 11/02/2014 1859   CL 106 06/14/2023 0847  CL 101 11/02/2014 1859   CO2 24 06/14/2023 0847   CO2 26 11/02/2014 1859   GLUCOSE 81 06/14/2023 0847   GLUCOSE 100 (H) 11/02/2014 1859   BUN 13 06/14/2023 0847   BUN 11 11/02/2014 1859   CREATININE 0.92 06/14/2023 0847   CREATININE 1.25 (H) 02/26/2023 0955   CREATININE 1.06 11/02/2014 1859   CALCIUM 8.6 (L) 06/14/2023 0847   CALCIUM 8.9 11/02/2014 1859   PROT 7.1 06/14/2023 0847   PROT 7.8 11/02/2014 1859   ALBUMIN 3.0 (L) 06/14/2023 0847   ALBUMIN 4.0 11/02/2014 1859   AST 26 06/14/2023 0847   AST 36 02/26/2023 0955   ALT 13 06/14/2023 0847   ALT 29 02/26/2023 0955   ALT 23 11/02/2014 1859   ALKPHOS 106 06/14/2023 0847   ALKPHOS 83 11/02/2014 1859   BILITOT 1.3 (H) 06/14/2023 0847   BILITOT 1.0 02/26/2023 0955   GFRNONAA >60 06/14/2023 0847   GFRNONAA >60 02/26/2023 0955   GFRNONAA >60 11/02/2014 1859   GFRAA >60 04/07/2020 0854   GFRAA >60 11/02/2014 1859    Radiological Exams on Admission: CT Angio Chest Pulmonary Embolism (PE) W or WO Contrast  Result Date: 06/14/2023 CLINICAL DATA:  History of DVTs with shortness of breath, congestion, and wheezing EXAM: CT ANGIOGRAPHY CHEST WITH CONTRAST TECHNIQUE: Multidetector CT imaging of the chest was performed using the standard  protocol during bolus administration of intravenous contrast. Multiplanar CT image reconstructions and MIPs were obtained to evaluate the vascular anatomy. RADIATION DOSE REDUCTION: This exam was performed according to the departmental dose-optimization program which includes automated exposure control, adjustment of the mA and/or kV according to Samuel Choi size and/or use of iterative reconstruction technique. CONTRAST:  75mL OMNIPAQUE IOHEXOL 350 MG/ML SOLN COMPARISON:  CTA chest dated 02/23/2023, same day chest radiograph FINDINGS: Cardiovascular: Left chest wall port terminates at the lower SVC. Irregular enhancement at the tip of the catheter (4:70). The study is high quality for the evaluation of pulmonary embolism. There are no filling defects in the central, lobar, segmental or subsegmental pulmonary artery branches to suggest acute pulmonary embolism. Main pulmonary artery measures 3.4 cm. Normal heart size. No significant pericardial fluid/thickening. Reflux of contrast material into the hepatic veins, suggesting a degree of right heart dysfunction. Coronary artery calcifications and aortic atherosclerosis. Mediastinum/Nodes: Similar rightward deviation of the mediastinum. Imaged thyroid gland without nodules meeting criteria for imaging follow-up by size. Mildly patulous esophagus. Increased size of 11 mm prevascular lymph node (4:56), previously 8 mm. Lungs/Pleura: The central airways are patent. Status post right upper lobectomy. Changes of centrilobular emphysema. Similar appearance of band-like consolidation involving the medial right middle and lower lobes with volume loss and traction bronchiectasis and bronchiolectasis in keeping with radiation changes. There is increased thickening along the major fissure. New irregular consolidation in the right middle lobe and new mural nodule along the posterior right lower lobe cystic bronchiectasis measuring 1.5 x 0.9 cm (5:85). Increased conspicuity of  previously noted nodular scarring adjacent to the cystic bronchiectasis measuring 1.2 x 0.5 cm (5:83). Increased left lung mosaic attenuation. Diffuse interlobular septal thickening. Persistent ill-defined micronodularity diffusely. No pneumothorax. Similar small right pleural effusion and thickening. Upper abdomen: Normal. Musculoskeletal: No acute or abnormal lytic or blastic osseous lesions. Review of the MIP images confirms the above findings. IMPRESSION: 1. No evidence of pulmonary embolism. 2. New irregular consolidation in the right middle lobe and new mural nodule along the posterior right lower lobe cystic bronchiectasis measuring 1.5 x 0.9 cm. Increased conspicuity  of previously noted nodular scarring adjacent to the cystic bronchiectasis measuring 1.2 x 0.5 cm. Findings are favored to represent infection/inflammation. 3. Increased size of 11 mm prevascular lymph node, likely reactive. 4. Increased left lung mosaic attenuation and interlobular septal thickening, likely pulmonary edema in the setting of right heart dysfunction and pulmonary arterial hypertension. Increased small pericardial effusion. 5. Persistent ill-defined micronodularity diffusely, which may be seen in the setting of respiratory bronchiolitis. 6. Irregular enhancement at the tip of the left chest wall port catheter, which may be related to contrast bolus timing. Recommend correlation with port function. 7. Aortic Atherosclerosis (ICD10-I70.0) and Emphysema (ICD10-J43.9). Electronically Signed   By: Agustin Cree M.D.   On: 06/14/2023 12:03   US Venous Img Lower Unilateral Left  Result Date: 06/14/2023 CLINICAL DATA:  Left leg swelling EXAM: Left LOWER EXTREMITY VENOUS DOPPLER ULTRASOUND TECHNIQUE: Gray-scale sonography with compression, as well as color and duplex ultrasound, were performed to evaluate the deep venous system(s) from the level of the common femoral vein through the popliteal and proximal calf veins. COMPARISON:  None  Available. FINDINGS: VENOUS Normal compressibility of the common femoral, superficial femoral, and popliteal veins, as well as the visualized calf veins. Visualized portions of profunda femoral vein and great saphenous vein unremarkable. No filling defects to suggest DVT on grayscale or color Doppler imaging. Doppler waveforms show normal direction of venous flow, normal respiratory plasticity and response to augmentation. Limited views of the contralateral common femoral vein are unremarkable. OTHER None. Limitations: none IMPRESSION: No evidence of left lower extremity DVT. Electronically Signed   By: Karen Kays M.D.   On: 06/14/2023 10:42   DG Chest 2 View  Result Date: 06/14/2023 CLINICAL DATA:  One-week history of cough, congestion, and shortness of breath EXAM: CHEST - 2 VIEW COMPARISON:  Chest radiograph dated 02/10/2022 FINDINGS: Lines/tubes: Left chest wall port tip projects over the superior cavoatrial junction. Lungs: Similar appearance of right perihilar radiation fibrosis. Mild diffuse patchy and interstitial left lung opacities. Pleura: Blunting of the right costophrenic angle.  No pneumothorax. Heart/mediastinum: Similar  cardiomediastinal silhouette. Bones: No acute osseous abnormality. IMPRESSION: 1. Mild diffuse patchy and interstitial left lung opacities, which may represent atypical infection or pulmonary edema. 2. Similar appearance of right perihilar radiation fibrosis. 3. Blunting of the right costophrenic angle, which may represent a small pleural effusion or scarring. Electronically Signed   By: Agustin Cree M.D.   On: 06/14/2023 10:05    EKG: Independently reviewed. A flutter, HR 96  DVT prophylaxis: enoxaparin (LOVENOX) injection 40 mg Start: 06/14/23 2200   Code Status: Does want CPR including chest compressions and cardioversion if needed but does not want to be intubated under any circumstances.  This was confirmed in detail with the Samuel Choi and his wife at bedside. Family  Communication: Wife at bedside Disposition Plan: Admit to observation Consults called: None  Leeroy Bock, DO [facility]  06/14/2023, 12:47 PM    To contact the appropriate [facility] Attending or Consulting provider: Check [email or web address] for coverage from 7pm-7am

## 2023-06-14 NOTE — ED Notes (Signed)
Pt stood at bedside to use urinal. Family at bedside

## 2023-06-14 NOTE — ED Triage Notes (Signed)
Pt states that he has been having swelling to the left leg for the past week, reports hx of blood clots in his leg a couple years ago and for the past 2 mos he has been unable to afford his blood thinning medication, pt states that he now feels sob, congested in his chest and reports wheezing at night time

## 2023-06-14 NOTE — ED Provider Notes (Signed)
[facility] Provider Note    Event Date/Time   First MD Initiated Contact with Patient 06/14/23 [phone]     (approximate)   History   Leg Swelling and Shortness of Breath   HPI  Samuel Choi is a 57 y.o. male history of lung cancer as well as DVT not currently on anticoagulation due to inability to afford anticoagulation medication presents to the ER for evaluation of shortness of breath as well as left leg swelling.  Has a history of DVT in the left leg.  He denies any chest pains.  Was found to be hypoxic on room air.  States that he does wear 2 L nasal cannula at home as needed has had some worsening shortness of breath however.  Denies any nausea or vomiting no fevers or chills.     Physical Exam   Triage Vital Signs: ED Triage Vitals  Encounter Vitals Group     BP 06/14/23 0840 (!) 143/99     Systolic BP Percentile --      Diastolic BP Percentile --      Pulse Rate 06/14/23 0840 96     Resp 06/14/23 0840 20     Temp 06/14/23 0840 98.3 F (36.8 C)     Temp Source 06/14/23 0840 Oral     SpO2 06/14/23 0840 (!) 89 %     Weight 06/14/23 0842 141 lb (64 kg)     Height 06/14/23 0842 6\' 1"  (1.854 m)     Head Circumference --      Peak Flow --      Pain Score 06/14/23 0841 0     Pain Loc --      Pain Education --      Exclude from Growth Chart --     Most recent vital signs: Vitals:   06/14/23 0930 06/14/23 1000  BP: (!) 150/99 (!) 155/102  Pulse: 90 89  Resp: 19 (!) 28  Temp:    SpO2: 96% 96%     Constitutional: Alert  Eyes: Conjunctivae are normal.  Head: Atraumatic. Nose: No congestion/rhinnorhea. Mouth/Throat: Mucous membranes are moist.   Neck: Painless ROM.  Cardiovascular:   Good peripheral circulation. Respiratory: Normal respiratory effort.  No retractions.  Gastrointestinal: Soft and nontender.  Musculoskeletal:  no deformity Neurologic:  MAE spontaneously. No gross focal neurologic deficits are appreciated.  Skin:  Skin  is warm, dry and intact. No rash noted. Psychiatric: Mood and affect are normal. Speech and behavior are normal.    ED Results / Procedures / Treatments   Labs (all labs ordered are listed, but only abnormal results are displayed) Labs Reviewed  CBC - Abnormal; Notable for the following components:      Result Value   RBC 3.55 (*)    Hemoglobin 12.4 (*)    HCT 37.0 (*)    MCV 104.2 (*)    MCH 34.9 (*)    All other components within normal limits  COMPREHENSIVE METABOLIC PANEL - Abnormal; Notable for the following components:   Calcium 8.6 (*)    Albumin 3.0 (*)    Total Bilirubin 1.3 (*)    All other components within normal limits  BRAIN NATRIURETIC PEPTIDE - Abnormal; Notable for the following components:   B Natriuretic Peptide 613.8 (*)    All other components within normal limits  TROPONIN I (HIGH SENSITIVITY) - Abnormal; Notable for the following components:   Troponin I (High Sensitivity) 20 (*)    All other components  within normal limits  SARS CORONAVIRUS 2 BY RT PCR  PROTIME-INR  TROPONIN I (HIGH SENSITIVITY)     EKG  ED ECG REPORT I, Willy Eddy, the attending physician, personally viewed and interpreted this ECG.   Date: 06/14/2023  EKG Time: 8:59  Rate: 95  Rhythm: sinus  Axis: normal  Intervals: rbbb  ST&T Change: nonspecific abn, lots of motion artifact    RADIOLOGY Please see ED Course for my review and interpretation.  I personally reviewed all radiographic images ordered to evaluate for the above acute complaints and reviewed radiology reports and findings.  These findings were personally discussed with the patient.  Please see medical record for radiology report.    PROCEDURES:  Critical Care performed: Yes, see critical care procedure note(s)  .Critical Care  Performed by: Willy Eddy, MD Authorized by: Willy Eddy, MD   Critical care provider statement:    Critical care time (minutes):  35   Critical care was  necessary to treat or prevent imminent or life-threatening deterioration of the following conditions:  Respiratory failure   Critical care was time spent personally by me on the following activities:  Ordering and performing treatments and interventions, ordering and review of laboratory studies, ordering and review of radiographic studies, pulse oximetry, re-evaluation of patient's condition, review of old charts, obtaining history from patient or surrogate, examination of patient, evaluation of patient's response to treatment, discussions with primary provider, discussions with consultants and development of treatment plan with patient or surrogate    MEDICATIONS ORDERED IN ED: Medications  albuterol (PROVENTIL) (2.5 MG/3ML) 0.083% nebulizer solution 3 mL (has no administration in time range)  iohexol (OMNIPAQUE) 350 MG/ML injection 75 mL (75 mLs Intravenous Contrast Given 06/14/23 1047)  ipratropium-albuterol (DUONEB) 0.5-2.5 (3) MG/3ML nebulizer solution 3 mL (3 mLs Nebulization Given 06/14/23 1125)  furosemide (LASIX) injection 40 mg (40 mg Intravenous Given 06/14/23 1128)  methylPREDNISolone sodium succinate (SOLU-MEDROL) 125 mg/2 mL injection 125 mg (125 mg Intravenous Given 06/14/23 1128)  ipratropium-albuterol (DUONEB) 0.5-2.5 (3) MG/3ML nebulizer solution 3 mL (3 mLs Nebulization Given 06/14/23 1125)     IMPRESSION / MDM / ASSESSMENT AND PLAN / ED COURSE  I reviewed the triage vital signs and the nursing notes.                              Differential diagnosis includes, but is not limited to, dvt, Asthma, copd, CHF, pna, ptx, malignancy, Pe, anemia  Patient presenting to the ER for evaluation of symptoms as described above.  Based on symptoms, risk factors and considered above differential, this presenting complaint could reflect a potentially life-threatening illness therefore the patient will be placed on continuous pulse oximetry and telemetry for monitoring.  Laboratory evaluation  will be sent to evaluate for the above complaints.  Lower extremity venous ultrasound as well as CTA will be ordered to evaluate for VTE.   Clinical Course as of 06/14/23 1201  Thu Jun 14, 2023  1002 No leukocytosis.  Mild elevation of his BNP and troponin.  Satting well on nasal cannula.  Chest x-ray on my review and interpretation without significant edema. [PR]  1058 CTA on my review and interpretation without evidence of PE. [PR]  1106 Presentation seems most consistent with CHF.  Given acute hypoxia will discuss case in consultation with hospitalist [PR]    Clinical Course User Index [PR] Willy Eddy, MD     FINAL CLINICAL IMPRESSION(S) / ED DIAGNOSES  Final diagnoses:  Acute on chronic respiratory failure with hypoxia (HCC)     Rx / DC Orders   ED Discharge Orders     None        Note:  This document was prepared using Dragon voice recognition software and may include unintentional dictation errors.    Willy Eddy, MD 06/14/23 1201

## 2023-06-15 ENCOUNTER — Encounter: Payer: Self-pay | Admitting: Oncology

## 2023-06-15 ENCOUNTER — Other Ambulatory Visit (HOSPITAL_COMMUNITY): Payer: Self-pay

## 2023-06-15 DIAGNOSIS — J441 Chronic obstructive pulmonary disease with (acute) exacerbation: Secondary | ICD-10-CM | POA: Diagnosis not present

## 2023-06-15 DIAGNOSIS — J9621 Acute and chronic respiratory failure with hypoxia: Secondary | ICD-10-CM | POA: Diagnosis not present

## 2023-06-15 LAB — CBC
HCT: 34.3 % — ABNORMAL LOW (ref 39.0–52.0)
Hemoglobin: 11.7 g/dL — ABNORMAL LOW (ref 13.0–17.0)
MCH: 35.2 pg — ABNORMAL HIGH (ref 26.0–34.0)
MCHC: 34.1 g/dL (ref 30.0–36.0)
MCV: 103.3 fL — ABNORMAL HIGH (ref 80.0–100.0)
Platelets: 153 10*3/uL (ref 150–400)
RBC: 3.32 MIL/uL — ABNORMAL LOW (ref 4.22–5.81)
RDW: 14.6 % (ref 11.5–15.5)
WBC: 9.7 10*3/uL (ref 4.0–10.5)
nRBC: 0 % (ref 0.0–0.2)

## 2023-06-15 LAB — BASIC METABOLIC PANEL
Anion gap: 14 (ref 5–15)
BUN: 18 mg/dL (ref 6–20)
CO2: 23 mmol/L (ref 22–32)
Calcium: 8.5 mg/dL — ABNORMAL LOW (ref 8.9–10.3)
Chloride: 99 mmol/L (ref 98–111)
Creatinine, Ser: 1.02 mg/dL (ref 0.61–1.24)
GFR, Estimated: >60 mL/min (ref >60)
Glucose, Bld: 130 mg/dL — ABNORMAL HIGH (ref 70–99)
Potassium: 3.3 mmol/L — ABNORMAL LOW (ref 3.5–5.1)
Sodium: 136 mmol/L (ref 135–145)

## 2023-06-15 MED ORDER — APIXABAN 2.5 MG PO TABS: 2.5000 mg | ORAL_TABLET | Freq: Two times a day (BID) | ORAL | 0 refills | Status: DC

## 2023-06-15 MED ORDER — PREDNISONE 20 MG PO TABS: 40.0000 mg | ORAL_TABLET | Freq: Every day | ORAL | 0 refills | Status: DC

## 2023-06-15 MED ORDER — CLONIDINE HCL 0.1 MG PO TABS: 0.1000 mg | ORAL_TABLET | Freq: Three times a day (TID) | ORAL | Status: DC

## 2023-06-15 MED ORDER — AZITHROMYCIN 500 MG PO TABS: 500.0000 mg | ORAL_TABLET | Freq: Every day | ORAL | 0 refills | Status: AC

## 2023-06-15 MED ORDER — ALBUTEROL SULFATE (2.5 MG/3ML) 0.083% IN NEBU
2.5000 mg | INHALATION_SOLUTION | Freq: Four times a day (QID) | RESPIRATORY_TRACT | 0 refills | Status: AC | PRN
Start: 2023-06-15 — End: ?

## 2023-06-15 MED ORDER — BREZTRI AEROSPHERE 160-9-4.8 MCG/ACT IN AERO: 2.0000 | INHALATION_SPRAY | Freq: Two times a day (BID) | RESPIRATORY_TRACT | 0 refills | Status: DC

## 2023-06-15 MED ORDER — ALBUTEROL SULFATE HFA 108 (90 BASE) MCG/ACT IN AERS: INHALATION_SPRAY | RESPIRATORY_TRACT | 0 refills | Status: DC

## 2023-06-15 MED ORDER — CLONIDINE HCL 0.1 MG PO TABS: 0.1000 mg | ORAL_TABLET | Freq: Three times a day (TID) | ORAL | Status: AC

## 2023-06-15 MED ORDER — FLUTICASONE FUROATE-VILANTEROL 200-25 MCG/ACT IN AEPB
1.0000 | INHALATION_SPRAY | Freq: Every day | RESPIRATORY_TRACT | Status: DC
  Administered 2023-06-15: 1 via RESPIRATORY_TRACT
  Filled 2023-06-15: qty 28

## 2023-06-15 NOTE — TOC Benefit Eligibility Note (Signed)
 Pharmacy Patient Advocate Encounter  Insurance verification completed.    The patient is insured through  [employer] Part D    Ran test claim for Eliquis. Currently a quantity of 60 is a 30 day supply and the co-pay is $47.00 .   Ran test claim for Xarelto. Currently a quantity of 30 is a 30 day supply and the co-pay is $47.00 .   This test claim was processed through [facility]- copay amounts may vary at other pharmacies due to pharmacy/plan contracts, or as the patient moves through the different stages of their insurance plan.

## 2023-06-15 NOTE — Discharge Summary (Signed)
Physician Discharge Summary  Patient: Samuel Choi DGU:[id number] DOB: [birth year]   Code Status: Limited: Do not attempt resuscitation (DNR) -DNR-LIMITED -Do Not Intubate/DNI  Admit date: 06/14/2023 Discharge date: 06/15/2023 Disposition: Home, No home health services recommended PCP: Barbette Reichmann, MD  Recommendations for Outpatient Follow-up:  Follow up with PCP within 1-2 weeks Regarding general hospital follow up and preventative care Recommend ensuring medication adherence to eliquis Repeat ECG  Discharge Diagnoses:  Principal Problem:   Hypoxia Active Problems:   History of lung cancer   COPD with acute exacerbation (HCC)   Acute on chronic respiratory failure with hypoxia [facility])  Brief Hospital Course Summary: Samuel Choi is a 57 y.o. male with a PMH significant for chronic respiratory failure on 2 L at baseline, history of lung cancer with right lobectomy and currently in remission, COPD, HTN, PE not on anticoagulation, SVT, thrombocytopenia, alcohol use, CHF, macrocytic anemia, tobacco use. At baseline, they live at home with her wife and are completely independent with ADLs.   They presented from home to the ED on 06/14/2023 with worsening shortness of breath x a couple days.  Endorses that he has also had a productive cough during this time with clear sputum.  Endorses history of DVT and PE but has not been on anticoagulation due to affordability.  Endorses that he has had lower extremity swelling for couple of days.  Denies fever.   In the ED, it was found that they had hypoxia requiring increased oxygen supplementation to 6 L nasal cannula.  Heart rate 96, respiratory rate 20, blood pressure 143/99.  Significant findings included: COVID-negative.  Hemoglobin 12.4, platelets 179.  Troponin 20>21.  Na+ 144, K+ 3.7, creatinine 0.92.  BNP 613 (lower than baseline) CTA chest is negative for PE.  Further findings are suggestive of infection/inflammation. They were  initially treated with IV Lasix, DuoNebs, Solu-Medrol.    Patient was admitted to medicine service for further workup and management of acute on chronic hypoxic respiratory failure from a COPD exacerbation.  He had improvement in symptoms overnight with the use of breathing treatments, steroid and antibiotic course.  He was weaned to his baseline 2Lnc which was stable even with ambulation.  He was discharged with the remaining course of antibiotic and steroid.  His inhalers and nebulizer medications were refilled and he was also refilled for his apixaban.  He was counseled on medication adherence and given resources including a free month supply coupon from pharmacy and TOC.   Discharge Condition: Good, improved Recommended discharge diet: Regular healthy diet  Consultations: None   Procedures/Studies: None   Allergies as of 06/15/2023   No Known Allergies      Medication List     STOP taking these medications    hydrALAZINE 50 MG tablet Commonly known as: APRESOLINE       TAKE these medications    albuterol 108 (90 Base) MCG/ACT inhaler Commonly known as: VENTOLIN HFA INHALE 2 PUFFS BY MOUTH EVERY 6 HOURS AS NEEDED FOR WHEEZING OR SHORTNESS OF BREATH   albuterol (2.5 MG/3ML) 0.083% nebulizer solution Commonly known as: PROVENTIL Take 3 mLs (2.5 mg total) by nebulization every 6 (six) hours as needed for wheezing or shortness of breath.   apixaban 2.5 MG Tabs tablet Commonly known as: Eliquis Take 1 tablet (2.5 mg total) by mouth 2 (two) times daily.   azithromycin 500 MG tablet Commonly known as: ZITHROMAX Take 1 tablet (500 mg total) by mouth daily for 3 days. Start  taking on: June 16, 2023   benzonatate 100 MG capsule Commonly known as: TESSALON Take 1 capsule (100 mg total) by mouth 3 (three) times daily as needed for cough.   Breztri Aerosphere 160-9-4.8 MCG/ACT Aero Generic drug: Budeson-Glycopyrrol-Formoterol Inhale 2 puffs into the lungs in the  morning and at bedtime.   cloNIDine 0.1 MG tablet Commonly known as: CATAPRES Take 1 tablet (0.1 mg total) by mouth 3 (three) times daily. What changed: when to take this   cyanocobalamin 1000 MCG tablet Commonly known as: VITAMIN B12 Take 1 tablet (1,000 mcg total) by mouth daily.   diltiazem 120 MG 24 hr capsule Commonly known as: CARDIZEM CD Take 120 mg by mouth daily.   feeding supplement Liqd Take 237 mLs by mouth 3 (three) times daily between meals.   latanoprost 0.005 % ophthalmic solution Commonly known as: XALATAN Place 1 drop into both eyes at bedtime.   loratadine 10 MG tablet Commonly known as: CLARITIN TAKE 1 TABLET(10 MG) BY MOUTH DAILY AS NEEDED FOR ALLERGIES   magnesium chloride 64 MG Tbec SR tablet Commonly known as: SLOW-MAG Take 1 tablet (64 mg total) by mouth daily.   methocarbamol 500 MG tablet Commonly known as: ROBAXIN Take 1 tablet (500 mg total) by mouth at bedtime.   metoprolol succinate 25 MG 24 hr tablet Commonly known as: TOPROL-XL Take 1 tablet (25 mg total) by mouth daily.   multivitamin with minerals Tabs tablet Take 1 tablet by mouth daily.   ondansetron 8 MG tablet Commonly known as: Zofran Take 1 tablet (8 mg total) by mouth every 8 (eight) hours as needed for refractory nausea / vomiting. Start on day 3 after chemo.   pantoprazole 20 MG tablet Commonly known as: PROTONIX TAKE 1 TABLET BY MOUTH DAILY   potassium chloride SA 20 MEQ tablet Commonly known as: KLOR-CON M Take 1 tablet (20 mEq total) by mouth daily.   predniSONE 20 MG tablet Commonly known as: DELTASONE Take 2 tablets (40 mg total) by mouth daily with breakfast for 4 days. Start taking on: June 16, 2023        Follow-up Information     Barbette Reichmann, MD. Schedule an appointment as soon as possible for a visit in 1 week(s).   Specialty: Internal Medicine Contact information: [address] [place]  [address] [phone]                Subjective   Pt reports feeling well. He feels that his breathing status is returned to baseline.   All questions and concerns were addressed at time of discharge.  Objective  Blood pressure (!) 150/94, pulse 96, temperature 98.3 F (36.8 C), temperature source Oral, resp. rate 17, height 6\' 1"  (1.854 m), weight 64 kg, SpO2 99%.   General: Pt is alert, awake, not in acute distress Cardiovascular: RRR, S1/S2 +, no rubs, no gallops Respiratory: CTA bilaterally, mild inspiratory wheezing, no rhonchi Abdominal: Soft, NT, ND, bowel sounds + Extremities: no edema, no cyanosis  The results of significant diagnostics from this hospitalization (including imaging, microbiology, ancillary and laboratory) are listed below for reference.   Imaging studies: CT Angio Chest Pulmonary Embolism (PE) W or WO Contrast  Result Date: 06/14/2023 CLINICAL DATA:  History of DVTs with shortness of breath, congestion, and wheezing EXAM: CT ANGIOGRAPHY CHEST WITH CONTRAST TECHNIQUE: Multidetector CT imaging of the chest was performed using the standard protocol during bolus administration of intravenous contrast. Multiplanar CT image reconstructions and MIPs  were obtained to evaluate the vascular anatomy. RADIATION DOSE REDUCTION: This exam was performed according to the departmental dose-optimization program which includes automated exposure control, adjustment of the mA and/or kV according to patient size and/or use of iterative reconstruction technique. CONTRAST:  75mL OMNIPAQUE IOHEXOL 350 MG/ML SOLN COMPARISON:  CTA chest dated 02/23/2023, same day chest radiograph FINDINGS: Cardiovascular: Left chest wall port terminates at the lower SVC. Irregular enhancement at the tip of the catheter (4:70). The study is high quality for the evaluation of pulmonary embolism. There are no filling defects in the central, lobar, segmental or subsegmental pulmonary artery branches to suggest  acute pulmonary embolism. Main pulmonary artery measures 3.4 cm. Normal heart size. No significant pericardial fluid/thickening. Reflux of contrast material into the hepatic veins, suggesting a degree of right heart dysfunction. Coronary artery calcifications and aortic atherosclerosis. Mediastinum/Nodes: Similar rightward deviation of the mediastinum. Imaged thyroid gland without nodules meeting criteria for imaging follow-up by size. Mildly patulous esophagus. Increased size of 11 mm prevascular lymph node (4:56), previously 8 mm. Lungs/Pleura: The central airways are patent. Status post right upper lobectomy. Changes of centrilobular emphysema. Similar appearance of band-like consolidation involving the medial right middle and lower lobes with volume loss and traction bronchiectasis and bronchiolectasis in keeping with radiation changes. There is increased thickening along the major fissure. New irregular consolidation in the right middle lobe and new mural nodule along the posterior right lower lobe cystic bronchiectasis measuring 1.5 x 0.9 cm (5:85). Increased conspicuity of previously noted nodular scarring adjacent to the cystic bronchiectasis measuring 1.2 x 0.5 cm (5:83). Increased left lung mosaic attenuation. Diffuse interlobular septal thickening. Persistent ill-defined micronodularity diffusely. No pneumothorax. Similar small right pleural effusion and thickening. Upper abdomen: Normal. Musculoskeletal: No acute or abnormal lytic or blastic osseous lesions. Review of the MIP images confirms the above findings. IMPRESSION: 1. No evidence of pulmonary embolism. 2. New irregular consolidation in the right middle lobe and new mural nodule along the posterior right lower lobe cystic bronchiectasis measuring 1.5 x 0.9 cm. Increased conspicuity of previously noted nodular scarring adjacent to the cystic bronchiectasis measuring 1.2 x 0.5 cm. Findings are favored to represent infection/inflammation. 3.  Increased size of 11 mm prevascular lymph node, likely reactive. 4. Increased left lung mosaic attenuation and interlobular septal thickening, likely pulmonary edema in the setting of right heart dysfunction and pulmonary arterial hypertension. Increased small pericardial effusion. 5. Persistent ill-defined micronodularity diffusely, which may be seen in the setting of respiratory bronchiolitis. 6. Irregular enhancement at the tip of the left chest wall port catheter, which may be related to contrast bolus timing. Recommend correlation with port function. 7. Aortic Atherosclerosis (ICD10-I70.0) and Emphysema (ICD10-J43.9). Electronically Signed   By: Agustin Cree M.D.   On: 06/14/2023 12:03   US Venous Img Lower Unilateral Left  Result Date: 06/14/2023 CLINICAL DATA:  Left leg swelling EXAM: Left LOWER EXTREMITY VENOUS DOPPLER ULTRASOUND TECHNIQUE: Gray-scale sonography with compression, as well as color and duplex ultrasound, were performed to evaluate the deep venous system(s) from the level of the common femoral vein through the popliteal and proximal calf veins. COMPARISON:  None Available. FINDINGS: VENOUS Normal compressibility of the common femoral, superficial femoral, and popliteal veins, as well as the visualized calf veins. Visualized portions of profunda femoral vein and great saphenous vein unremarkable. No filling defects to suggest DVT on grayscale or color Doppler imaging. Doppler waveforms show normal direction of venous flow, normal respiratory plasticity and response to augmentation. Limited views of the  contralateral common femoral vein are unremarkable. OTHER None. Limitations: none IMPRESSION: No evidence of left lower extremity DVT. Electronically Signed   By: Karen Kays M.D.   On: 06/14/2023 10:42   DG Chest 2 View  Result Date: 06/14/2023 CLINICAL DATA:  One-week history of cough, congestion, and shortness of breath EXAM: CHEST - 2 VIEW COMPARISON:  Chest radiograph dated 02/10/2022  FINDINGS: Lines/tubes: Left chest wall port tip projects over the superior cavoatrial junction. Lungs: Similar appearance of right perihilar radiation fibrosis. Mild diffuse patchy and interstitial left lung opacities. Pleura: Blunting of the right costophrenic angle.  No pneumothorax. Heart/mediastinum: Similar  cardiomediastinal silhouette. Bones: No acute osseous abnormality. IMPRESSION: 1. Mild diffuse patchy and interstitial left lung opacities, which may represent atypical infection or pulmonary edema. 2. Similar appearance of right perihilar radiation fibrosis. 3. Blunting of the right costophrenic angle, which may represent a small pleural effusion or scarring. Electronically Signed   By: Agustin Cree M.D.   On: 06/14/2023 10:05    Labs: Basic Metabolic Panel: Recent Labs  Lab 06/14/23 0847 06/15/23 0552  NA 144 136  K 3.7 3.3*  CL 106 99  CO2 24 23  GLUCOSE 81 130*  BUN 13 18  CREATININE 0.92 1.02  CALCIUM 8.6* 8.5*   CBC: Recent Labs  Lab 06/14/23 0847 06/15/23 0552  WBC 8.6 9.7  HGB 12.4* 11.7*  HCT 37.0* 34.3*  MCV 104.2* 103.3*  PLT 179 153   Microbiology: Results for orders placed or performed during the hospital encounter of 06/14/23  SARS Coronavirus 2 by RT PCR (hospital order, performed in [facility] hospital lab) *cepheid single result test* Anterior Nasal Swab     Status: None   Collection Time: 06/14/23  8:47 AM   Specimen: Anterior Nasal Swab  Result Value Ref Range Status   SARS Coronavirus 2 by RT PCR NEGATIVE NEGATIVE Final    Comment: (NOTE) SARS-CoV-2 target nucleic acids are NOT DETECTED.  The SARS-CoV-2 RNA is generally detectable in upper and lower respiratory specimens during the acute phase of infection. The lowest concentration of SARS-CoV-2 viral copies this assay can detect is 250 copies / mL. A negative result does not preclude SARS-CoV-2 infection and should not be used as the sole basis for treatment or other patient management decisions.   A negative result may occur with improper specimen collection / handling, submission of specimen other than nasopharyngeal swab, presence of viral mutation(s) within the areas targeted by this assay, and inadequate number of viral copies (<250 copies / mL). A negative result must be combined with clinical observations, patient history, and epidemiological information.  Fact Sheet for Patients:   RoadLapTop.co.za  Fact Sheet for Healthcare Providers: http://kim-miller.com/  This test is not yet approved or  cleared by the [place] FDA and has been authorized for detection and/or diagnosis of SARS-CoV-2 by FDA under an Emergency Use Authorization (EUA).  This EUA will remain in effect (meaning this test can be used) for the duration of the COVID-19 declaration under Section 564(b)(1) of the Act, 21 U.S.C. section 360bbb-3(b)(1), unless the authorization is terminated or revoked sooner.  Performed at [facility], [address] [place], [place] [address]   Respiratory (~20 pathogens) panel by PCR     Status: None   Collection Time: 06/14/23 12:51 PM   Specimen: Nasopharyngeal Swab; Respiratory  Result Value Ref Range Status   Adenovirus NOT DETECTED NOT DETECTED Final   Coronavirus 229E NOT DETECTED NOT DETECTED Final  Comment: (NOTE) The Coronavirus on the Respiratory Panel, DOES NOT test for the novel  Coronavirus (2019 nCoV)    Coronavirus HKU1 NOT DETECTED NOT DETECTED Final   Coronavirus NL63 NOT DETECTED NOT DETECTED Final   Coronavirus OC43 NOT DETECTED NOT DETECTED Final   Metapneumovirus NOT DETECTED NOT DETECTED Final   Rhinovirus / Enterovirus NOT DETECTED NOT DETECTED Final   Influenza A NOT DETECTED NOT DETECTED Final   Influenza B NOT DETECTED NOT DETECTED Final   Parainfluenza Virus 1 NOT DETECTED NOT DETECTED Final   Parainfluenza Virus 2 NOT DETECTED NOT DETECTED Final   Parainfluenza Virus 3 NOT  DETECTED NOT DETECTED Final   Parainfluenza Virus 4 NOT DETECTED NOT DETECTED Final   Respiratory Syncytial Virus NOT DETECTED NOT DETECTED Final   Bordetella pertussis NOT DETECTED NOT DETECTED Final   Bordetella Parapertussis NOT DETECTED NOT DETECTED Final   Chlamydophila pneumoniae NOT DETECTED NOT DETECTED Final   Mycoplasma pneumoniae NOT DETECTED NOT DETECTED Final    Comment: Performed at [facility], [address], [place], [place] [address]  SARS Coronavirus 2 by RT PCR (hospital order, performed in [facility] hospital lab) *cepheid single result test* Anterior Nasal Swab     Status: None   Collection Time: 06/14/23 12:51 PM   Specimen: Anterior Nasal Swab  Result Value Ref Range Status   SARS Coronavirus 2 by RT PCR NEGATIVE NEGATIVE Final    Comment: (NOTE) SARS-CoV-2 target nucleic acids are NOT DETECTED.  The SARS-CoV-2 RNA is generally detectable in upper and lower respiratory specimens during the acute phase of infection. The lowest concentration of SARS-CoV-2 viral copies this assay can detect is 250 copies / mL. A negative result does not preclude SARS-CoV-2 infection and should not be used as the sole basis for treatment or other patient management decisions.  A negative result may occur with improper specimen collection / handling, submission of specimen other than nasopharyngeal swab, presence of viral mutation(s) within the areas targeted by this assay, and inadequate number of viral copies (<250 copies / mL). A negative result must be combined with clinical observations, patient history, and epidemiological information.  Fact Sheet for Patients:   RoadLapTop.co.za  Fact Sheet for Healthcare Providers: http://kim-miller.com/  This test is not yet approved or  cleared by the [place] FDA and has been authorized for detection and/or diagnosis of SARS-CoV-2 by FDA under an Emergency Use Authorization  (EUA).  This EUA will remain in effect (meaning this test can be used) for the duration of the COVID-19 declaration under Section 564(b)(1) of the Act, 21 U.S.C. section 360bbb-3(b)(1), unless the authorization is terminated or revoked sooner.  Performed at [facility], [address], [place], [place] [address]    Time coordinating discharge: Over 30 minutes  Leeroy Bock, MD  [facility] 06/15/2023, 11:08 AM

## 2023-06-15 NOTE — TOC Initial Note (Signed)
Transition of Care [facility]) - Initial/Assessment Note    Patient Details  Name: Samuel Choi MRN: [id number] Date of Birth: 30-Nov-1965  Transition of Care [facility]) CM/SW Contact:    Marquita Palms, LCSW Phone Number: 06/15/2023, 1:15 PM  Clinical Narrative:                  CSW met with patient bedside. Patient reported that he does not have a PCP and he needs help with his Eloquis prescription. Patient reported that is is too expensive. CSW printed application and explained the process to patient. Patient has no other other needs at this time. Awaiting discharge.       Patient Goals and CMS Choice            Expected Discharge Plan and Services         Expected Discharge Date: 06/15/23                                    Prior Living Arrangements/Services                       Activities of Daily Living      Permission Sought/Granted                  Emotional Assessment              Admission diagnosis:  Hypoxia [R09.02] Patient Active Problem List   Diagnosis Date Noted   Hypoxia 06/14/2023   History of lung cancer 06/14/2023   COPD with acute exacerbation (HCC) 06/14/2023   Acute on chronic respiratory failure with hypoxia (HCC) 06/14/2023   Necrotic pneumonia (HCC) 04/07/2022   Cavitary lesion of lung 02/11/2022   Acute respiratory failure (HCC) 02/10/2022   Lactic acidosis 02/10/2022   SVT (supraventricular tachycardia) (HCC) 02/02/2022   Sepsis (HCC) 02/01/2022   Hyponatremia 02/01/2022   Chronic diastolic CHF (congestive heart failure) (HCC) 02/01/2022   Macrocytic anemia 02/01/2022   Elevated serum creatinine 01/25/2022   Diverticulosis of colon 11/18/2021   History of thrombosis 11/18/2021   Weight loss 11/09/2021   C. difficile colitis 10/19/2021   Hyperkalemia 07/28/2021   Severe protein-calorie malnutrition (HCC) 06/08/2021   Antineoplastic chemotherapy induced anemia 06/08/2021   Pulmonary embolus (HCC)  06/08/2021   Chemotherapy induced nausea and vomiting 05/17/2021   Symptomatic anemia 05/17/2021   Wide-complex tachycardia    Chemotherapy induced neutropenia (HCC) 11/09/2020   COPD (chronic obstructive pulmonary disease) (HCC) 04/22/2020   Sinus tachycardia 12/18/2019   Hypomagnesemia 12/10/2019   Encounter for antineoplastic immunotherapy 11/24/2019   Ex-smoker 07/23/2019   Port-A-Cath in place 04/04/2019   Thrombocytopenia (HCC) 04/04/2019   Folate deficiency 04/04/2019   Hypokalemia 04/04/2019   B12 deficiency 04/04/2019   Alcohol abuse 04/04/2019   Dehydration 02/07/2019   Encounter for antineoplastic chemotherapy 01/31/2019   Squamous cell carcinoma of right lung (HCC) 09/03/2018   Goals of care, counseling/discussion 09/03/2018   Malnutrition of moderate degree 08/23/2018   Emphysema of lung (HCC) 06/2018   Allergic rhinitis 06/2018   H/O nasal polyp 02/20/2018   Acute sinusitis 10/10/2017   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/10/2017   Chronic rhinitis 10/10/2017   Mass of sinus 10/10/2017   Essential hypertension 09/27/2017   Tobacco abuse 09/27/2017   PCP:  Barbette Reichmann, MD Pharmacy:   [id number] - [place], [place] - [address] AT NEC OF  [address] [place] [address] Phone: [phone] Fax: [phone]  [facility] - [place], [place] - [address] [place] [address] Phone: [phone] Fax: [phone]     Social Determinants of Health (SDOH) Social History: SDOH Screenings   Food Insecurity: No Food Insecurity (08/29/2018)   Received from [facility], [facility]  Transportation Needs: No Transportation Needs (08/29/2018)   Received from [facility], [facility]  Depression [phone]): Low Risk  (03/28/2022)  Financial Resource Strain: Low Risk  (08/29/2018)   Received from  [facility], [facility]  Physical Activity: Inactive (08/29/2018)   Received from [facility], [facility]  Social Connections: Moderately Integrated (08/29/2018)   Received from [facility], [facility]  Tobacco Use: Medium Risk (06/14/2023)   Received from [facility]   SDOH Interventions:     Readmission Risk Interventions    02/12/2022   11:30 AM  Readmission Risk Prevention Plan  Transportation Screening Complete  PCP or Specialist Appt within 3-5 Days Complete  HRI or Home Care Consult Complete  Social Work Consult for Recovery Care Planning/Counseling Complete  Palliative Care Screening Not Applicable  Medication Review Oceanographer) Complete

## 2023-06-15 NOTE — Progress Notes (Incomplete)
PROGRESS NOTE  Samuel Choi    DOB: 1966-04-10, 57 y.o.  ZOX:[id number]    Code Status: Limited: Do not attempt resuscitation (DNR) -DNR-LIMITED -Do Not Intubate/DNI    DOA: 06/14/2023   LOS: 0   Brief hospital course  Samuel Choi is a 57 y.o. male with a PMH significant for chronic respiratory failure on 2 L at baseline, history of lung cancer with right lobectomy and currently in remission, COPD, HTN, PE not on anticoagulation, SVT, thrombocytopenia, alcohol use, CHF, macrocytic anemia, tobacco use. At baseline, they live at home with her wife and are completely independent with ADLs.   They presented from home to the ED on 06/14/2023 with worsening shortness of breath x a couple days.  Endorses that he has also had a productive cough during this time with clear sputum.  Endorses history of DVT and PE but has not been on anticoagulation due to affordability.  Endorses that he has had lower extremity swelling for couple of days.  Denies fever.   In the ED, it was found that they had hypoxia requiring increased oxygen supplementation to 6 L nasal cannula.  Heart rate 96, respiratory rate 20, blood pressure 143/99.  Significant findings included: COVID-negative.  Hemoglobin 12.4, platelets 179.  Troponin 20>21.  Na+ 144, K+ 3.7, creatinine 0.92.  BNP 613 (lower than baseline) CTA chest is negative for PE.  Further findings are suggestive of infection. They were initially treated with IV Lasix, DuoNebs, Solu-Medrol.    Patient was admitted to medicine service for further workup and management of acute on chronic hypoxic respiratory failure as outlined in detail below.   Does want CPR including chest compressions and cardioversion if needed but does not want to be intubated under any circumstances.  This was confirmed in detail with the patient and his wife at bedside.  06/15/23 -***  Assessment & Plan  Principal Problem:   Hypoxia Active Problems:   History of lung cancer   COPD with  acute exacerbation (HCC)   Acute on chronic respiratory failure with hypoxia (HCC)  *** -   *** -   *** -   *** -   *** -   Body mass index is 18.6 kg/m.  VTE ppx: enoxaparin (LOVENOX) injection 40 mg Start: 06/14/23 2200 apixaban (ELIQUIS) tablet 2.5 mg Start: 06/14/23 2200 apixaban (ELIQUIS) tablet 2.5 mg   Diet:     Diet   Diet regular Room service appropriate? Yes; Fluid consistency: Thin   Consultants: ***  Subjective 06/15/23    Pt reports ***   Objective   Vitals:   06/15/23 0300 06/15/23 0400 06/15/23 0700 06/15/23 0804  BP: (!) 155/94 (!) 147/85 (!) 149/93   Pulse: 92 91 91   Resp: 20 20 18    Temp:    98.3 F (36.8 C)  TempSrc:    Oral  SpO2: 96% 95% 97%   Weight:      Height:        Intake/Output Summary (Last 24 hours) at 06/15/2023 0819 Last data filed at 06/14/2023 1500 Gross per 24 hour  Intake --  Output 1200 ml  Net -1200 ml   Filed Weights   06/14/23 0842  Weight: 64 kg     Physical Exam: *** General: awake, alert, NAD HEENT: atraumatic, clear conjunctiva, anicteric sclera, MMM, hearing grossly normal Respiratory: normal respiratory effort. Cardiovascular: quick capillary refill, normal S1/S2, RRR, no JVD, murmurs Gastrointestinal: soft, NT, ND Nervous: A&O x3. no gross focal neurologic deficits, normal  speech Extremities: moves all equally, no edema, normal tone Skin: dry, intact, normal temperature, normal color. No rashes, lesions or ulcers on exposed skin Psychiatry: normal mood, congruent affect  Labs   I have personally reviewed the following labs and imaging studies CBC    Component Value Date/Time   WBC 9.7 06/15/2023 0552   RBC 3.32 (L) 06/15/2023 0552   HGB 11.7 (L) 06/15/2023 0552   HGB 13.6 02/26/2023 0955   HGB 15.9 11/02/2014 1859   HCT 34.3 (L) 06/15/2023 0552   HCT 46.0 11/02/2014 1859   PLT 153 06/15/2023 0552   PLT 178 02/26/2023 0955   PLT 143 (L) 11/02/2014 1859   MCV 103.3 (H) 06/15/2023 0552    MCV 100 11/02/2014 1859   MCH 35.2 (H) 06/15/2023 0552   MCHC 34.1 06/15/2023 0552   RDW 14.6 06/15/2023 0552   RDW 13.1 11/02/2014 1859   LYMPHSABS 1.0 02/26/2023 0955   LYMPHSABS 1.5 07/07/2014 0516   MONOABS 0.8 02/26/2023 0955   MONOABS 0.6 07/07/2014 0516   EOSABS 0.1 02/26/2023 0955   EOSABS 0.3 07/07/2014 0516   BASOSABS 0.1 02/26/2023 0955   BASOSABS 0.1 07/07/2014 0516      Latest Ref Rng & Units 06/14/2023    8:47 AM 02/26/2023    9:55 AM 12/21/2022   12:54 PM  BMP  Glucose 70 - 99 mg/dL 81  84  68   BUN 6 - 20 mg/dL 13  23  14    Creatinine 0.61 - 1.24 mg/dL 5.62  1.30  8.65   Sodium 135 - 145 mmol/L 144  134  138   Potassium 3.5 - 5.1 mmol/L 3.7  3.7  4.6   Chloride 98 - 111 mmol/L 106  102  99   CO2 22 - 32 mmol/L 24  20  23    Calcium 8.9 - 10.3 mg/dL 8.6  9.0  9.8     CT Angio Chest Pulmonary Embolism (PE) W or WO Contrast  Result Date: 06/14/2023 CLINICAL DATA:  History of DVTs with shortness of breath, congestion, and wheezing EXAM: CT ANGIOGRAPHY CHEST WITH CONTRAST TECHNIQUE: Multidetector CT imaging of the chest was performed using the standard protocol during bolus administration of intravenous contrast. Multiplanar CT image reconstructions and MIPs were obtained to evaluate the vascular anatomy. RADIATION DOSE REDUCTION: This exam was performed according to the departmental dose-optimization program which includes automated exposure control, adjustment of the mA and/or kV according to patient size and/or use of iterative reconstruction technique. CONTRAST:  75mL OMNIPAQUE IOHEXOL 350 MG/ML SOLN COMPARISON:  CTA chest dated 02/23/2023, same day chest radiograph FINDINGS: Cardiovascular: Left chest wall port terminates at the lower SVC. Irregular enhancement at the tip of the catheter (4:70). The study is high quality for the evaluation of pulmonary embolism. There are no filling defects in the central, lobar, segmental or subsegmental pulmonary artery branches to  suggest acute pulmonary embolism. Main pulmonary artery measures 3.4 cm. Normal heart size. No significant pericardial fluid/thickening. Reflux of contrast material into the hepatic veins, suggesting a degree of right heart dysfunction. Coronary artery calcifications and aortic atherosclerosis. Mediastinum/Nodes: Similar rightward deviation of the mediastinum. Imaged thyroid gland without nodules meeting criteria for imaging follow-up by size. Mildly patulous esophagus. Increased size of 11 mm prevascular lymph node (4:56), previously 8 mm. Lungs/Pleura: The central airways are patent. Status post right upper lobectomy. Changes of centrilobular emphysema. Similar appearance of band-like consolidation involving the medial right middle and lower lobes with volume loss and traction bronchiectasis  and bronchiolectasis in keeping with radiation changes. There is increased thickening along the major fissure. New irregular consolidation in the right middle lobe and new mural nodule along the posterior right lower lobe cystic bronchiectasis measuring 1.5 x 0.9 cm (5:85). Increased conspicuity of previously noted nodular scarring adjacent to the cystic bronchiectasis measuring 1.2 x 0.5 cm (5:83). Increased left lung mosaic attenuation. Diffuse interlobular septal thickening. Persistent ill-defined micronodularity diffusely. No pneumothorax. Similar small right pleural effusion and thickening. Upper abdomen: Normal. Musculoskeletal: No acute or abnormal lytic or blastic osseous lesions. Review of the MIP images confirms the above findings. IMPRESSION: 1. No evidence of pulmonary embolism. 2. New irregular consolidation in the right middle lobe and new mural nodule along the posterior right lower lobe cystic bronchiectasis measuring 1.5 x 0.9 cm. Increased conspicuity of previously noted nodular scarring adjacent to the cystic bronchiectasis measuring 1.2 x 0.5 cm. Findings are favored to represent infection/inflammation. 3.  Increased size of 11 mm prevascular lymph node, likely reactive. 4. Increased left lung mosaic attenuation and interlobular septal thickening, likely pulmonary edema in the setting of right heart dysfunction and pulmonary arterial hypertension. Increased small pericardial effusion. 5. Persistent ill-defined micronodularity diffusely, which may be seen in the setting of respiratory bronchiolitis. 6. Irregular enhancement at the tip of the left chest wall port catheter, which may be related to contrast bolus timing. Recommend correlation with port function. 7. Aortic Atherosclerosis (ICD10-I70.0) and Emphysema (ICD10-J43.9). Electronically Signed   By: Agustin Cree M.D.   On: 06/14/2023 12:03   US Venous Img Lower Unilateral Left  Result Date: 06/14/2023 CLINICAL DATA:  Left leg swelling EXAM: Left LOWER EXTREMITY VENOUS DOPPLER ULTRASOUND TECHNIQUE: Gray-scale sonography with compression, as well as color and duplex ultrasound, were performed to evaluate the deep venous system(s) from the level of the common femoral vein through the popliteal and proximal calf veins. COMPARISON:  None Available. FINDINGS: VENOUS Normal compressibility of the common femoral, superficial femoral, and popliteal veins, as well as the visualized calf veins. Visualized portions of profunda femoral vein and great saphenous vein unremarkable. No filling defects to suggest DVT on grayscale or color Doppler imaging. Doppler waveforms show normal direction of venous flow, normal respiratory plasticity and response to augmentation. Limited views of the contralateral common femoral vein are unremarkable. OTHER None. Limitations: none IMPRESSION: No evidence of left lower extremity DVT. Electronically Signed   By: Karen Kays M.D.   On: 06/14/2023 10:42   DG Chest 2 View  Result Date: 06/14/2023 CLINICAL DATA:  One-week history of cough, congestion, and shortness of breath EXAM: CHEST - 2 VIEW COMPARISON:  Chest radiograph dated 02/10/2022  FINDINGS: Lines/tubes: Left chest wall port tip projects over the superior cavoatrial junction. Lungs: Similar appearance of right perihilar radiation fibrosis. Mild diffuse patchy and interstitial left lung opacities. Pleura: Blunting of the right costophrenic angle.  No pneumothorax. Heart/mediastinum: Similar  cardiomediastinal silhouette. Bones: No acute osseous abnormality. IMPRESSION: 1. Mild diffuse patchy and interstitial left lung opacities, which may represent atypical infection or pulmonary edema. 2. Similar appearance of right perihilar radiation fibrosis. 3. Blunting of the right costophrenic angle, which may represent a small pleural effusion or scarring. Electronically Signed   By: Agustin Cree M.D.   On: 06/14/2023 10:05    Disposition Plan & Communication  Patient status: Observation  Admitted From: {From:23814} Planned disposition location: {PLAN; DISPOSITION:26386} Anticipated discharge date: *** pending ***  Family Communication: ***    Author: Leeroy Bock, DO [facility] 06/15/2023, 8:19  AM   Available by Epic secure chat 7AM-7PM. If 7PM-7AM, please contact night-coverage.  [facility] contact information found on [email or web address].

## 2023-06-15 NOTE — Discharge Instructions (Signed)
Please continue your steroid and antibiotic course until they are completed.   I have refilled your inhalers, nebulizer medication, and your blood thinner. Please continue to take these as directed to help prevent repeat breathing worsening.   I also recommend you wear your oxygen at all times.   Follow up with your primary doctor in 1-2 weeks to reassess and for any refills needed

## 2023-06-20 ENCOUNTER — Ambulatory Visit: Payer: Medicare Other | Admitting: Urology

## 2023-06-20 VITALS — BP 176/93 | HR 84 | Ht 73.0 in | Wt 147.4 lb

## 2023-06-20 DIAGNOSIS — R972 Elevated prostate specific antigen [PSA]: Secondary | ICD-10-CM

## 2023-06-20 NOTE — Patient Instructions (Signed)
Prostate MRI Prep:  1- No ejaculation 48 hours prior to exam  2- No caffeine or carbonated beverages on day of the exam  3- Eat light diet evening prior and day of exam  4- Avoid eating 4 hours prior to exam  5- Fleets enema needs to be done 4 hours prior to exam -See below. Can be purchased at the drug store.

## 2023-06-20 NOTE — Progress Notes (Signed)
Marcelle Overlie Plume,acting as a scribe for [name], MD.,have documented all relevant documentation on the behalf of [name], MD,as directed by  [name], MD while in the presence of [name], MD.  06/20/23 9:35 AM   Hyman Bible Liliana Cline 22-Aug-1965 [id number]  Referring provider: Barbette Reichmann, MD [address] 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 He presents today for reassessment of elevated PSA.   He was first referred to me back in 2022. At that time, his PSA was elevated to 8.2, but trended back down to the 5 range. His PSA was back up to 10.4 on 10/17/2022. Previous prostate MRI in 2022 showed a PI-RADS 3 lesion, but otherwise no issues. He did not have any urinary symptoms.   His most recent PSA on 05/16/2023 has risen to 15.6. It was repeated from a previous PSA on 04/18/2023 and remains stably elevated.   In the interim, oncology notes were reviewed. He is due to see medical oncology again in February with repeat imaging. He is not currently under any treatment and has not progressed. That being said, he was hospitalized for hypoxia earlier this month and noted to have increased nodular scarring, increased size of prevascular lymph nodes, some of which may have been infection related.   Today, he denies any urinary symptoms.   Prostate Specific Ag, Serum  Latest Ref Rng 0.0 - 4.0 ng/mL  03/08/2021 8.2 (H)   10/11/2021 5.1 (H)   10/17/2022 10.4 (H)   04/18/2023 15.6 (H)   05/16/2023 15.6 (H)      PMH: Past Medical History:  Diagnosis Date   Alcohol abuse    usually drinks 2-3 drinks per day   Atherosclerosis 06/2018   Cancer associated pain    Chronic sinusitis    Dehydration 02/07/2019   Emphysema of lung (HCC) 06/2018    patient unaware of this.   Hip fracture (HCC) 05/2018   no surgery   History of kidney stones 05/2018   per xray, bilateral nephrolitiasis   Hypertension    Squamous cell carcinoma of lung, right (HCC) 06/2018    Surgical History: Past Surgical History:  Procedure Laterality Date   BRAIN SURGERY  10/2017   nasal/sinus endoscopy. mass benign   ELECTROMAGNETIC NAVIGATION BROCHOSCOPY Right 06/25/2018   Procedure: ELECTROMAGNETIC NAVIGATION BRONCHOSCOPY;  Surgeon: Erin Fulling, MD;  Location: [facility];  Service: Cardiopulmonary;  Laterality: Right;   ENDOBRONCHIAL ULTRASOUND Right 06/25/2018   Procedure: ENDOBRONCHIAL ULTRASOUND;  Surgeon: Erin Fulling, MD;  Location: [facility];  Service: Cardiopulmonary;  Laterality: Right;   ENDOBRONCHIAL ULTRASOUND Right 12/26/2018   Procedure: ENDOBRONCHIAL ULTRASOUND RIGHT;  Surgeon: Erin Fulling, MD;  Location: [facility];  Service: Cardiopulmonary;  Laterality: Right;   FLEXIBLE BRONCHOSCOPY N/A 08/20/2018   Procedure: FLEXIBLE BRONCHOSCOPY PREOP;  Surgeon: Hulda Marin, MD;  Location: [facility] OR[facility];  Service: Thoracic;  Laterality: N/A;   IR CV LINE INJECTION  04/04/2019   NASAL SINUS SURGERY  10/2017   At [facility], frontal sinusotomy, ethmoidectomy, resection anterior cranial fossa neoplasm, turbinate resection   PERIPHERAL VASCULAR THROMBECTOMY Left 12/31/2020   Procedure: PERIPHERAL VASCULAR THROMBECTOMY;  Surgeon: Annice Needy, MD;  Location: [facility];  Service: Cardiovascular;  Laterality: Left;   PORTACATH PLACEMENT Left 01/15/2019  Procedure: INSERTION PORT-A-CATH;  Surgeon: Hulda Marin, MD;  Location: [facility];  Service: General;  Laterality: Left;   THORACOTOMY Right 08/13/2018   Procedure: PREOP BROCHOSCOPY WITH RIGHT THORACOTOMY AND RUL RESECTION;  Surgeon: Hulda Marin, MD;  Location: [facility];  Service: General;  Laterality: Right;   THORACOTOMY Right 08/20/2018   Procedure: THORACOTOMY MAJOR RIGHT UPPER LOBE LOBECTOMY;  Surgeon: Hulda Marin, MD;  Location: [facility];  Service: Thoracic;  Laterality: Right;   TOE SURGERY Left    pin in left toe    Home Medications:  Allergies as of 06/20/2023   No Known Allergies      Medication List        Accurate as of June 20, 2023  9:35 AM. If you have any questions, ask your nurse or doctor.          STOP taking these medications    predniSONE 20 MG tablet Commonly known as: DELTASONE Stopped by: [name]       TAKE these medications    albuterol 108 (90 Base) MCG/ACT inhaler Commonly known as: VENTOLIN HFA INHALE 2 PUFFS BY MOUTH EVERY 6 HOURS AS NEEDED FOR WHEEZING OR SHORTNESS OF BREATH   albuterol (2.5 MG/3ML) 0.083% nebulizer solution Commonly known as: PROVENTIL Take 3 mLs (2.5 mg total) by nebulization every 6 (six) hours as needed for wheezing or shortness of breath.   apixaban 2.5 MG Tabs tablet Commonly known as: Eliquis Take 1 tablet (2.5 mg total) by mouth 2 (two) times daily.   benzonatate 100 MG capsule Commonly known as: TESSALON Take 1 capsule (100 mg total) by mouth 3 (three) times daily as needed for cough.   Breztri Aerosphere 160-9-4.8 MCG/ACT Aero Generic drug: Budeson-Glycopyrrol-Formoterol Inhale 2 puffs into the lungs in the morning and at bedtime.   cloNIDine 0.1 MG tablet Commonly known as: CATAPRES Take 1 tablet (0.1 mg total) by mouth 3 (three) times daily.   cyanocobalamin 1000 MCG tablet Commonly known as: VITAMIN B12 Take 1 tablet (1,000 mcg total) by mouth daily.   diltiazem 120 MG 24 hr capsule Commonly known as: CARDIZEM CD Take 120 mg by mouth daily.   feeding supplement Liqd Take 237 mLs by mouth 3 (three) times daily between meals.   latanoprost 0.005 % ophthalmic solution Commonly known as: XALATAN Place 1 drop into both eyes at bedtime.   loratadine 10 MG tablet Commonly known as: CLARITIN TAKE 1 TABLET(10 MG) BY MOUTH DAILY AS NEEDED FOR ALLERGIES   magnesium chloride 64 MG Tbec SR  tablet Commonly known as: SLOW-MAG Take 1 tablet (64 mg total) by mouth daily.   methocarbamol 500 MG tablet Commonly known as: ROBAXIN Take 1 tablet (500 mg total) by mouth at bedtime.   metoprolol succinate 25 MG 24 hr tablet Commonly known as: TOPROL-XL Take 1 tablet (25 mg total) by mouth daily.   multivitamin with minerals Tabs tablet Take 1 tablet by mouth daily.   ondansetron 8 MG tablet Commonly known as: Zofran Take 1 tablet (8 mg total) by mouth every 8 (eight) hours as needed for refractory nausea / vomiting. Start on day 3 after chemo.   pantoprazole 20 MG tablet Commonly known as: PROTONIX TAKE 1 TABLET BY MOUTH DAILY   potassium chloride SA 20 MEQ tablet Commonly known as: KLOR-CON M Take 1 tablet (20 mEq total) by mouth daily.        Family History: Family History  Problem Relation Age of Onset   Breast cancer Mother  Diabetes Mother    Lung cancer Father    Hypertension Father     Social History:  reports that he quit smoking about 4 years ago. His smoking use included cigarettes. He started smoking about 19 years ago. He has a 7.5 pack-year smoking history. He has never used smokeless tobacco. He reports current alcohol use of about 3.0 standard drinks of alcohol per week. He reports that he does not use drugs.   Physical Exam: BP (!) 176/93   Pulse 84   Ht 6\' 1"  (1.854 m)   Wt 147 lb 6 oz (66.8 kg)   BMI 19.44 kg/m   Constitutional:  Alert and oriented, No acute distress.   HEENT: [place] AT, moist mucus membranes.  Trachea midline, no masses Neurologic: Grossly intact, no focal deficits, moving all 4 extremities. Psychiatric: Normal mood and affect.   Assessment & Plan:    1. Elevated PSA - He likely does have undiagnosed prostate cancer, but given his severe medical comorbidities, the relevance of this is still questionable - Given his acute PSA rise, plan to repeat another prostate MRI to assess for any advanced or locally aggressive  disease - Consider the need for a biopsy based on MRI findings and the his overall health status; would likely be helpful to have this follow-up after his appointment with oncology in February after surveillance imaging for better assessment of his current oncologic status. - Monitor PSA levels and symptoms.  - Discuss potential treatment options if MRI indicates significant progression.  Return for discussion of prostate MRI results and lung imaging to determine next steps.  I have reviewed the above documentation for accuracy and completeness, and I agree with the above.   [name], MD    [facility] [address] [place], [place] [address] [phone]

## 2023-06-29 ENCOUNTER — Other Ambulatory Visit: Payer: Self-pay

## 2023-06-29 ENCOUNTER — Encounter: Payer: Self-pay | Admitting: Emergency Medicine

## 2023-06-29 ENCOUNTER — Emergency Department: Payer: Medicare Other

## 2023-06-29 ENCOUNTER — Inpatient Hospital Stay
Admission: EM | Admit: 2023-06-29 | Discharge: 2023-07-01 | DRG: 291 | Disposition: A | Discharge status: Home / Self Care | Payer: Medicare Other | Attending: Internal Medicine | Admitting: Internal Medicine

## 2023-06-29 DIAGNOSIS — I5033 Acute on chronic diastolic (congestive) heart failure: Secondary | ICD-10-CM | POA: Diagnosis present

## 2023-06-29 DIAGNOSIS — Z85118 Personal history of other malignant neoplasm of bronchus and lung: Secondary | ICD-10-CM | POA: Diagnosis not present

## 2023-06-29 DIAGNOSIS — Z87891 Personal history of nicotine dependence: Secondary | ICD-10-CM | POA: Diagnosis not present

## 2023-06-29 DIAGNOSIS — Z7901 Long term (current) use of anticoagulants: Secondary | ICD-10-CM

## 2023-06-29 DIAGNOSIS — J439 Emphysema, unspecified: Secondary | ICD-10-CM | POA: Diagnosis present

## 2023-06-29 DIAGNOSIS — Z801 Family history of malignant neoplasm of trachea, bronchus and lung: Secondary | ICD-10-CM | POA: Diagnosis not present

## 2023-06-29 DIAGNOSIS — Z86718 Personal history of other venous thrombosis and embolism: Secondary | ICD-10-CM

## 2023-06-29 DIAGNOSIS — I272 Pulmonary hypertension, unspecified: Secondary | ICD-10-CM | POA: Diagnosis present

## 2023-06-29 DIAGNOSIS — Z66 Do not resuscitate: Secondary | ICD-10-CM | POA: Diagnosis present

## 2023-06-29 DIAGNOSIS — I50813 Acute on chronic right heart failure: Secondary | ICD-10-CM

## 2023-06-29 DIAGNOSIS — Z1152 Encounter for screening for COVID-19: Secondary | ICD-10-CM | POA: Diagnosis not present

## 2023-06-29 DIAGNOSIS — Z8249 Family history of ischemic heart disease and other diseases of the circulatory system: Secondary | ICD-10-CM

## 2023-06-29 DIAGNOSIS — D72829 Elevated white blood cell count, unspecified: Secondary | ICD-10-CM | POA: Diagnosis present

## 2023-06-29 DIAGNOSIS — Z902 Acquired absence of lung [part of]: Secondary | ICD-10-CM | POA: Diagnosis not present

## 2023-06-29 DIAGNOSIS — E876 Hypokalemia: Secondary | ICD-10-CM | POA: Diagnosis present

## 2023-06-29 DIAGNOSIS — I509 Heart failure, unspecified: Secondary | ICD-10-CM

## 2023-06-29 DIAGNOSIS — Z9981 Dependence on supplemental oxygen: Secondary | ICD-10-CM

## 2023-06-29 DIAGNOSIS — Z833 Family history of diabetes mellitus: Secondary | ICD-10-CM

## 2023-06-29 DIAGNOSIS — Z803 Family history of malignant neoplasm of breast: Secondary | ICD-10-CM

## 2023-06-29 DIAGNOSIS — J441 Chronic obstructive pulmonary disease with (acute) exacerbation: Secondary | ICD-10-CM | POA: Diagnosis present

## 2023-06-29 DIAGNOSIS — I110 Hypertensive heart disease with heart failure: Principal | ICD-10-CM | POA: Diagnosis present

## 2023-06-29 DIAGNOSIS — Z79899 Other long term (current) drug therapy: Secondary | ICD-10-CM | POA: Diagnosis not present

## 2023-06-29 DIAGNOSIS — I5032 Chronic diastolic (congestive) heart failure: Secondary | ICD-10-CM | POA: Diagnosis present

## 2023-06-29 DIAGNOSIS — Z86711 Personal history of pulmonary embolism: Secondary | ICD-10-CM

## 2023-06-29 DIAGNOSIS — Z9221 Personal history of antineoplastic chemotherapy: Secondary | ICD-10-CM | POA: Diagnosis not present

## 2023-06-29 DIAGNOSIS — Z7951 Long term (current) use of inhaled steroids: Secondary | ICD-10-CM | POA: Diagnosis not present

## 2023-06-29 DIAGNOSIS — Z923 Personal history of irradiation: Secondary | ICD-10-CM

## 2023-06-29 DIAGNOSIS — I5020 Unspecified systolic (congestive) heart failure: Secondary | ICD-10-CM | POA: Diagnosis not present

## 2023-06-29 DIAGNOSIS — I5021 Acute systolic (congestive) heart failure: Secondary | ICD-10-CM | POA: Diagnosis not present

## 2023-06-29 DIAGNOSIS — R0600 Dyspnea, unspecified: Principal | ICD-10-CM

## 2023-06-29 LAB — COMPREHENSIVE METABOLIC PANEL
ALT: 65 U/L — ABNORMAL HIGH (ref 0–44)
AST: 27 U/L (ref 15–41)
Albumin: 3.2 g/dL — ABNORMAL LOW (ref 3.5–5.0)
Alkaline Phosphatase: 139 U/L — ABNORMAL HIGH (ref 38–126)
Anion gap: 18 — ABNORMAL HIGH (ref 5–15)
BUN: 18 mg/dL (ref 6–20)
CO2: 20 mmol/L — ABNORMAL LOW (ref 22–32)
Calcium: 8.4 mg/dL — ABNORMAL LOW (ref 8.9–10.3)
Chloride: 104 mmol/L (ref 98–111)
Creatinine, Ser: 0.84 mg/dL (ref 0.61–1.24)
GFR, Estimated: >60 mL/min (ref >60)
Glucose, Bld: 55 mg/dL — ABNORMAL LOW (ref 70–99)
Potassium: 3 mmol/L — ABNORMAL LOW (ref 3.5–5.1)
Sodium: 142 mmol/L (ref 135–145)
Total Bilirubin: 1.4 mg/dL — ABNORMAL HIGH (ref <1.2)
Total Protein: 6.8 g/dL (ref 6.5–8.1)

## 2023-06-29 LAB — RESP PANEL BY RT-PCR (RSV, FLU A&B, COVID)  RVPGX2
Influenza A by PCR: NEGATIVE
Influenza B by PCR: NEGATIVE
Resp Syncytial Virus by PCR: NEGATIVE
SARS Coronavirus 2 by RT PCR: NEGATIVE

## 2023-06-29 LAB — BRAIN NATRIURETIC PEPTIDE: B Natriuretic Peptide: 897 pg/mL — ABNORMAL HIGH (ref 0.0–100.0)

## 2023-06-29 LAB — CBC
HCT: 38.8 % — ABNORMAL LOW (ref 39.0–52.0)
Hemoglobin: 13 g/dL (ref 13.0–17.0)
MCH: 35.1 pg — ABNORMAL HIGH (ref 26.0–34.0)
MCHC: 33.5 g/dL (ref 30.0–36.0)
MCV: 104.9 fL — ABNORMAL HIGH (ref 80.0–100.0)
Platelets: 138 10*3/uL — ABNORMAL LOW (ref 150–400)
RBC: 3.7 MIL/uL — ABNORMAL LOW (ref 4.22–5.81)
RDW: 14.5 % (ref 11.5–15.5)
WBC: 14.5 10*3/uL — ABNORMAL HIGH (ref 4.0–10.5)
nRBC: 0 % (ref 0.0–0.2)

## 2023-06-29 LAB — LIPASE, BLOOD: Lipase: 25 U/L (ref 11–51)

## 2023-06-29 LAB — PROTIME-INR
INR: 1.4 — ABNORMAL HIGH (ref 0.8–1.2)
Prothrombin Time: 17.3 s — ABNORMAL HIGH (ref 11.4–15.2)

## 2023-06-29 LAB — CBG MONITORING, ED: Glucose-Capillary: 74 mg/dL (ref 70–99)

## 2023-06-29 LAB — MAGNESIUM: Magnesium: 1.6 mg/dL — ABNORMAL LOW (ref 1.7–2.4)

## 2023-06-29 LAB — TROPONIN I (HIGH SENSITIVITY): Troponin I (High Sensitivity): 58 ng/L — ABNORMAL HIGH (ref <18)

## 2023-06-29 MED ORDER — POTASSIUM CHLORIDE CRYS ER 20 MEQ PO TBCR
40.0000 meq | EXTENDED_RELEASE_TABLET | ORAL | Status: AC
  Administered 2023-06-29: 40 meq via ORAL
  Filled 2023-06-29 (×2): qty 2

## 2023-06-29 MED ORDER — SODIUM CHLORIDE 0.9% FLUSH
3.0000 mL | INTRAVENOUS | Status: DC | PRN
Start: 2023-06-29 — End: 2023-07-01

## 2023-06-29 MED ORDER — FUROSEMIDE 10 MG/ML IJ SOLN
40.0000 mg | Freq: Once | INTRAMUSCULAR | Status: AC
  Administered 2023-06-29: 40 mg via INTRAVENOUS
  Filled 2023-06-29: qty 4

## 2023-06-29 MED ORDER — METHOCARBAMOL 500 MG PO TABS
500.0000 mg | ORAL_TABLET | Freq: Every day | ORAL | Status: DC
  Administered 2023-06-29 – 2023-06-30 (×2): 500 mg via ORAL
  Filled 2023-06-29 (×2): qty 1

## 2023-06-29 MED ORDER — HYDRALAZINE HCL 20 MG/ML IJ SOLN: 5.0000 mg | Freq: Four times a day (QID) | INTRAMUSCULAR | Status: DC | PRN

## 2023-06-29 MED ORDER — METOPROLOL SUCCINATE ER 50 MG PO TB24
50.0000 mg | ORAL_TABLET | Freq: Every day | ORAL | Status: DC
Start: 2023-06-30 — End: 2023-07-01
  Administered 2023-06-30 – 2023-07-01 (×2): 50 mg via ORAL
  Filled 2023-06-29 (×2): qty 1

## 2023-06-29 MED ORDER — METHYLPREDNISOLONE SODIUM SUCC 125 MG IJ SOLR
125.0000 mg | INTRAMUSCULAR | Status: AC
Start: 2023-06-29 — End: 2023-06-29
  Administered 2023-06-29: 125 mg via INTRAVENOUS
  Filled 2023-06-29: qty 2

## 2023-06-29 MED ORDER — MAGNESIUM CHLORIDE 64 MG PO TBEC
1.0000 | DELAYED_RELEASE_TABLET | Freq: Every day | ORAL | Status: DC
  Administered 2023-06-30: 64 mg via ORAL
  Filled 2023-06-29 (×2): qty 1

## 2023-06-29 MED ORDER — PANTOPRAZOLE SODIUM 20 MG PO TBEC
20.0000 mg | DELAYED_RELEASE_TABLET | Freq: Every day | ORAL | Status: DC
  Administered 2023-06-30 – 2023-07-01 (×2): 20 mg via ORAL
  Filled 2023-06-29 (×2): qty 1

## 2023-06-29 MED ORDER — FLUTICASONE FUROATE-VILANTEROL 200-25 MCG/ACT IN AEPB
1.0000 | INHALATION_SPRAY | Freq: Every day | RESPIRATORY_TRACT | Status: DC
  Administered 2023-07-01: 1 via RESPIRATORY_TRACT
  Filled 2023-06-29: qty 28

## 2023-06-29 MED ORDER — MAGNESIUM SULFATE IN D5W 1-5 GM/100ML-% IV SOLN
1.0000 g | Freq: Once | INTRAVENOUS | Status: AC
  Administered 2023-06-29: 1 g via INTRAVENOUS
  Filled 2023-06-29: qty 100

## 2023-06-29 MED ORDER — PREDNISONE 20 MG PO TABS
40.0000 mg | ORAL_TABLET | Freq: Every day | ORAL | Status: DC
  Administered 2023-06-30 – 2023-07-01 (×2): 40 mg via ORAL
  Filled 2023-06-29 (×2): qty 2

## 2023-06-29 MED ORDER — SODIUM CHLORIDE 0.9% FLUSH
3.0000 mL | Freq: Two times a day (BID) | INTRAVENOUS | Status: DC
  Administered 2023-06-29 – 2023-06-30 (×3): 3 mL via INTRAVENOUS

## 2023-06-29 MED ORDER — ENSURE ENLIVE PO LIQD: 237.0000 mL | Freq: Three times a day (TID) | ORAL | Status: DC

## 2023-06-29 MED ORDER — ACETAMINOPHEN 325 MG PO TABS: 650.0000 mg | ORAL_TABLET | ORAL | Status: DC | PRN

## 2023-06-29 MED ORDER — ONDANSETRON HCL 4 MG PO TABS: 8.0000 mg | ORAL_TABLET | Freq: Three times a day (TID) | ORAL | Status: DC | PRN

## 2023-06-29 MED ORDER — LATANOPROST 0.005 % OP SOLN
1.0000 [drp] | Freq: Every day | OPHTHALMIC | Status: DC
Start: 2023-06-29 — End: 2023-07-01
  Administered 2023-06-29 – 2023-06-30 (×2): 1 [drp] via OPHTHALMIC
  Filled 2023-06-29 (×2): qty 2.5

## 2023-06-29 MED ORDER — IPRATROPIUM-ALBUTEROL 0.5-2.5 (3) MG/3ML IN SOLN
3.0000 mL | Freq: Once | RESPIRATORY_TRACT | Status: AC
  Administered 2023-06-29: 3 mL via RESPIRATORY_TRACT
  Filled 2023-06-29: qty 3

## 2023-06-29 MED ORDER — LORATADINE 10 MG PO TABS
10.0000 mg | ORAL_TABLET | Freq: Every day | ORAL | Status: DC
  Administered 2023-06-30 – 2023-07-01 (×2): 10 mg via ORAL
  Filled 2023-06-29 (×2): qty 1

## 2023-06-29 MED ORDER — APIXABAN 2.5 MG PO TABS
2.5000 mg | ORAL_TABLET | Freq: Two times a day (BID) | ORAL | Status: DC
  Administered 2023-06-29 – 2023-07-01 (×4): 2.5 mg via ORAL
  Filled 2023-06-29 (×4): qty 1

## 2023-06-29 MED ORDER — SODIUM CHLORIDE 0.9 % IV SOLN: 250.0000 mL | INTRAVENOUS | Status: AC | PRN

## 2023-06-29 MED ORDER — FUROSEMIDE 10 MG/ML IJ SOLN
40.0000 mg | Freq: Two times a day (BID) | INTRAMUSCULAR | Status: DC
  Administered 2023-06-29 – 2023-07-01 (×4): 40 mg via INTRAVENOUS
  Filled 2023-06-29 (×4): qty 4

## 2023-06-29 MED ORDER — ONDANSETRON HCL 4 MG/2ML IJ SOLN: 4.0000 mg | Freq: Four times a day (QID) | INTRAMUSCULAR | Status: DC | PRN

## 2023-06-29 MED ORDER — BENZONATATE 100 MG PO CAPS: 100.0000 mg | ORAL_CAPSULE | Freq: Three times a day (TID) | ORAL | Status: DC | PRN

## 2023-06-29 MED ORDER — BUDESON-GLYCOPYRROL-FORMOTEROL 160-9-4.8 MCG/ACT IN AERO: 2.0000 | INHALATION_SPRAY | Freq: Two times a day (BID) | RESPIRATORY_TRACT | Status: DC

## 2023-06-29 MED ORDER — ALBUTEROL SULFATE (2.5 MG/3ML) 0.083% IN NEBU: 2.5000 mg | INHALATION_SOLUTION | Freq: Four times a day (QID) | RESPIRATORY_TRACT | Status: DC | PRN

## 2023-06-29 NOTE — ED Notes (Signed)
Transferred pt to room 30 via WC, hooked up to room O2, placed on CCM, call bell within reach, NAD, no needs stated at this time. Bed is locked and low

## 2023-06-29 NOTE — ED Provider Notes (Signed)
[facility] Provider Note    Event Date/Time   First MD Initiated Contact with Patient 06/29/23 1122     (approximate)   History   Shortness of Breath   HPI  Samuel Choi is a 57 y.o. male  PMH "respiratory failure on 2 L at baseline, history of lung cancer with right lobectomy and currently in remission, COPD, HTN, PE not on anticoagulation, SVT, thrombocytopenia, alcohol use, CHF, macrocytic anemia, tobacco use."  Has had increasing shortness of breath since leaving the hospital he also reports that his stomach and both legs to be: Quite swollen.  He is short of breath when he lays down.        Physical Exam   Triage Vital Signs: ED Triage Vitals [06/29/23 1109]  Encounter Vitals Group     BP 119/78     Systolic BP Percentile      Diastolic BP Percentile      Pulse Rate 91     Resp 20     Temp 98.4 F (36.9 C)     Temp Source Oral     SpO2 (!) 84 %     Weight      Height      Head Circumference      Peak Flow      Pain Score      Pain Loc      Pain Education      Exclude from Growth Chart     Most recent vital signs: Vitals:   07/01/23 0600 07/01/23 0700  BP: (!) 171/108 (!) 173/112  Pulse: 86 88  Resp: (!) 22 16  Temp:    SpO2: 100% 100%     General: Awake, mild tachypnea.  Requiring approximately 5 L nasal cannula to maintain saturations in the mid 90s.  Typically on 2 L CV:  Good peripheral perfusion.  Resp:  Normal effort.  Lung sounds was crackles in the bases. Abd:  No distention.  Appears to be some element of abdominal body wall edema that extends down to the legs bilaterally consistent with anasarca Other:  Pitting edema lower extremities bilaterally.  Abdomen is soft nontender throughout.  No obvious ascites   ED Results / Procedures / Treatments   Labs (all labs ordered are listed, but only abnormal results are displayed) Labs Reviewed  CBC - Abnormal; Notable for the following components:      Result  Value   WBC 14.5 (*)    RBC 3.70 (*)    HCT 38.8 (*)    MCV 104.9 (*)    MCH 35.1 (*)    Platelets 138 (*)    All other components within normal limits  COMPREHENSIVE METABOLIC PANEL - Abnormal; Notable for the following components:   Potassium 3.0 (*)    CO2 20 (*)    Glucose, Bld 55 (*)    Calcium 8.4 (*)    Albumin 3.2 (*)    ALT 65 (*)    Alkaline Phosphatase 139 (*)    Total Bilirubin 1.4 (*)    Anion gap 18 (*)    All other components within normal limits  BRAIN NATRIURETIC PEPTIDE - Abnormal; Notable for the following components:   B Natriuretic Peptide 897.0 (*)    All other components within normal limits  PROTIME-INR - Abnormal; Notable for the following components:   Prothrombin Time 17.3 (*)    INR 1.4 (*)    All other components within normal limits  MAGNESIUM -  Abnormal; Notable for the following components:   Magnesium 1.6 (*)    All other components within normal limits  BASIC METABOLIC PANEL - Abnormal; Notable for the following components:   Glucose, Bld 132 (*)    BUN 25 (*)    Calcium 8.5 (*)    All other components within normal limits  BASIC METABOLIC PANEL - Abnormal; Notable for the following components:   Sodium 134 (*)    Potassium 3.1 (*)    Chloride 90 (*)    BUN 27 (*)    Calcium 8.8 (*)    All other components within normal limits  CBC - Abnormal; Notable for the following components:   WBC 18.9 (*)    RBC 3.67 (*)    Hemoglobin 12.7 (*)    HCT 36.8 (*)    MCV 100.3 (*)    MCH 34.6 (*)    Platelets 122 (*)    All other components within normal limits  MAGNESIUM - Abnormal; Notable for the following components:   Magnesium 1.6 (*)    All other components within normal limits  TROPONIN I (HIGH SENSITIVITY) - Abnormal; Notable for the following components:   Troponin I (High Sensitivity) 58 (*)    All other components within normal limits  RESP PANEL BY RT-PCR (RSV, FLU A&B, COVID)  RVPGX2  LIPASE, BLOOD  HIV ANTIBODY (ROUTINE  TESTING W REFLEX)  PHOSPHORUS  CBG MONITORING, ED     EKG  And inter by me at 1110 heart rate 90 QRS 120 QTc 500 Normal sinus rhythm, right bundle branch block.  Potential underlying right ventricular hypertrophy.  No evidence of frank ischemia is noted.   RADIOLOGY  Chest x-ray negative for acute finding  ECHOCARDIOGRAM COMPLETE Result Date: 06/30/2023    ECHOCARDIOGRAM REPORT   Patient Name:   BURTON DANZIGER Date of Exam: 06/30/2023 Medical Rec #:  [id number]      Height:       73.0 in Accession #:    [id number]     Weight:       147.3 lb Date of Birth:  26-Aug-1965       BSA:          1.889 m Patient Age:    57 years       BP:           162/106 mmHg Patient Gender: M              HR:           97 bpm. Exam Location:  [facility] Procedure: 2D Echo Indications:     CHF I50.31  History:         Patient has prior history of Echocardiogram examinations, most                  recent 02/04/2022.  Sonographer:     Overton Mam RDCS, FASE Referring Phys:  [id number] Emeline General Diagnosing Phys: Windell Norfolk IMPRESSIONS  1. Left ventricular ejection fraction, by estimation, is 65 to 70%. The left ventricle has normal function. The left ventricle has no regional wall motion abnormalities. There is moderate concentric left ventricular hypertrophy. Left ventricular diastolic parameters are indeterminate. There is the interventricular septum is flattened in systole and diastole, consistent with right ventricular pressure and volume overload.  2. Right ventricular systolic function is mildly reduced. The right ventricular size is moderately enlarged. There is severely elevated pulmonary artery systolic pressure. The estimated right ventricular systolic pressure is 79.0  mmHg.  3. Right atrial size was severely dilated.  4. A small pericardial effusion is present. The pericardial effusion is circumferential. There is no evidence of cardiac tamponade.  5. The mitral valve is normal in structure. Trivial mitral valve  regurgitation.  6. Tricuspid valve regurgitation is severe.  7. The aortic valve is tricuspid. Aortic valve regurgitation is not visualized.  8. The inferior vena cava is dilated in size with <50% respiratory variability, suggesting right atrial pressure of 15 mmHg. FINDINGS  Left Ventricle: Left ventricular ejection fraction, by estimation, is 65 to 70%. The left ventricle has normal function. The left ventricle has no regional wall motion abnormalities. The left ventricular internal cavity size was small. There is moderate  concentric left ventricular hypertrophy. The interventricular septum is flattened in systole and diastole, consistent with right ventricular pressure and volume overload. Left ventricular diastolic parameters are indeterminate. Right Ventricle: The right ventricular size is moderately enlarged. No increase in right ventricular wall thickness. Right ventricular systolic function is mildly reduced. There is severely elevated pulmonary artery systolic pressure. The tricuspid regurgitant velocity is 4.00 m/s, and with an assumed right atrial pressure of 15 mmHg, the estimated right ventricular systolic pressure is 79.0 mmHg. Left Atrium: Left atrial size was normal in size. Right Atrium: Right atrial size was severely dilated. Pericardium: A small pericardial effusion is present. The pericardial effusion is circumferential. There is no evidence of cardiac tamponade. Mitral Valve: The mitral valve is normal in structure. Trivial mitral valve regurgitation. Tricuspid Valve: The tricuspid valve is normal in structure. Tricuspid valve regurgitation is severe. Aortic Valve: The aortic valve is tricuspid. Aortic valve regurgitation is not visualized. Aortic valve peak gradient measures 6.5 mmHg. Pulmonic Valve: The pulmonic valve was not well visualized. Pulmonic valve regurgitation is trivial. Aorta: The aortic root and ascending aorta are structurally normal, with no evidence of dilitation. Venous: The  inferior vena cava is dilated in size with less than 50% respiratory variability, suggesting right atrial pressure of 15 mmHg. IAS/Shunts: The atrial septum is grossly normal.  LEFT VENTRICLE PLAX 2D LVIDd:         3.40 cm   Diastology LVIDs:         2.40 cm   LV e' medial:    8.86 cm/s LV PW:         1.50 cm   LV E/e' medial:  7.8 LV IVS:        1.40 cm   LV e' lateral:   10.38 cm/s LVOT diam:     1.80 cm   LV E/e' lateral: 6.7 LV SV:         48 LV SV Index:   25 LVOT Area:     2.54 cm  RIGHT VENTRICLE RV Basal diam:  4.30 cm LEFT ATRIUM           Index       RIGHT ATRIUM           Index LA diam:      3.10 cm 1.64 cm/m  RA Area:     27.50 cm LA Vol (A4C): 11.5 ml 6.09 ml/m  RA Volume:   103.00 ml 54.52 ml/m  AORTIC VALVE                 PULMONIC VALVE AV Area (Vmax): 2.50 cm     PV Vmax:       0.99 m/s AV Vmax:        127.00 cm/s  PV Peak  grad:  4.0 mmHg AV Peak Grad:   6.5 mmHg LVOT Vmax:      125.00 cm/s LVOT Vmean:     72.600 cm/s LVOT VTI:       0.187 m                              PULMONARY ARTERY AORTA                        MPA diam:        2.50 cm Ao Root diam: 3.80 cm Ao Asc diam:  3.10 cm MITRAL VALVE                TRICUSPID VALVE MV Area (PHT): 6.17 cm     TV Peak grad:   56.2 mmHg MV Decel Time: 123 msec     TV Vmax:        3.75 m/s MV E velocity: 69.45 cm/s   TR Peak grad:   64.0 mmHg MV A velocity: 103.85 cm/s  TR Vmax:        400.00 cm/s MV E/A ratio:  0.67                             SHUNTS                             Systemic VTI:  0.19 m                             Systemic Diam: 1.80 cm Windell Norfolk Electronically signed by Windell Norfolk Signature Date/Time: 06/30/2023/1:32:09 PM    Final    Gallbladder is noted to be dilated with wall edema.  Without associated stones or associated right upper quadrant pain I am suspicious this edema is likely representative anasarca/secondary cause   PROCEDURES:  Critical Care performed: No  Procedures   MEDICATIONS ORDERED IN  ED: Medications  potassium chloride SA (KLOR-CON M) CR tablet 40 mEq (40 mEq Oral Not Given 06/29/23 1901)  metoprolol succinate (TOPROL-XL) 24 hr tablet 50 mg (50 mg Oral Given 06/30/23 0909)  pantoprazole (PROTONIX) EC tablet 20 mg (20 mg Oral Given 06/30/23 0909)  apixaban (ELIQUIS) tablet 2.5 mg (2.5 mg Oral Given 06/30/23 2148)  methocarbamol (ROBAXIN) tablet 500 mg (500 mg Oral Given 06/30/23 2148)  feeding supplement (ENSURE ENLIVE / ENSURE PLUS) liquid 237 mL (237 mLs Oral Not Given 06/30/23 2000)  benzonatate (TESSALON) capsule 100 mg (has no administration in time range)  loratadine (CLARITIN) tablet 10 mg (10 mg Oral Given 06/30/23 0909)  latanoprost (XALATAN) 0.005 % ophthalmic solution 1 drop (1 drop Both Eyes Given 06/30/23 2148)  sodium chloride flush (NS) 0.9 % injection 3 mL (3 mLs Intravenous Given 06/30/23 2205)  sodium chloride flush (NS) 0.9 % injection 3 mL (has no administration in time range)  0.9 %  sodium chloride infusion (has no administration in time range)  acetaminophen (TYLENOL) tablet 650 mg (has no administration in time range)  predniSONE (DELTASONE) tablet 40 mg (40 mg Oral Given 07/01/23 0729)  fluticasone furoate-vilanterol (BREO ELLIPTA) 200-25 MCG/ACT 1 puff (1 puff Inhalation Given 07/01/23 0734)  hydrALAZINE (APRESOLINE) injection 10 mg (has no administration in time range)  metoprolol tartrate (LOPRESSOR) injection 5 mg (has no administration in time  range)  ipratropium-albuterol (DUONEB) 0.5-2.5 (3) MG/3ML nebulizer solution 3 mL (has no administration in time range)  ondansetron (ZOFRAN) injection 4 mg (has no administration in time range)  senna-docusate (Senokot-S) tablet 1 tablet (has no administration in time range)  magnesium oxide (MAG-OX) tablet 800 mg (has no administration in time range)  potassium chloride SA (KLOR-CON M) CR tablet 40 mEq (has no administration in time range)  cloNIDine (CATAPRES) tablet 0.1 mg (has no administration in  time range)  diltiazem (CARDIZEM CD) 24 hr capsule 120 mg (has no administration in time range)  cyanocobalamin (VITAMIN B12) tablet 1,000 mcg (has no administration in time range)  ipratropium-albuterol (DUONEB) 0.5-2.5 (3) MG/3ML nebulizer solution 3 mL (3 mLs Nebulization Given 06/29/23 1301)  furosemide (LASIX) injection 40 mg (40 mg Intravenous Given 06/29/23 1421)  ipratropium-albuterol (DUONEB) 0.5-2.5 (3) MG/3ML nebulizer solution 3 mL (3 mLs Nebulization Given 06/29/23 1425)  methylPREDNISolone sodium succinate (SOLU-MEDROL) 125 mg/2 mL injection 125 mg (125 mg Intravenous Given 06/29/23 1422)  magnesium sulfate IVPB 1 g 100 mL (0 g Intravenous Stopped 06/29/23 2129)     IMPRESSION / MDM / ASSESSMENT AND PLAN / ED COURSE  I reviewed the triage vital signs and the nursing notes.                              Differential diagnosis includes, but is not limited to, possible CHF, volume overload and anasarca, cirrhosis,  Patient's presentation is most consistent with acute presentation with potential threat to life or bodily function.   The patient is on the cardiac monitor to evaluate for evidence of arrhythmia and/or significant heart rate changes.    Labs interpreted as mild hypokalemia.   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  Will initiate diuretic admit to hospitalist if he needs further workup including I suspect echo further evaluation as to cause.  No evidence of ascites.  Recent CT angiography that showed no PE  FINAL CLINICAL IMPRESSION(S) / ED DIAGNOSES   Final diagnoses:  Dyspnea, unspecified type     Rx / DC Orders   ED Discharge Orders     None        Note:  This document was prepared using Dragon voice recognition software and may include unintentional dictation errors.   Sharyn Creamer, MD 07/01/23 [phone]

## 2023-06-29 NOTE — H&P (Addendum)
History and Physical    Samuel Choi:[id number] DOB: Jul 09, 1966 DOA: 06/29/2023  PCP: Barbette Reichmann, MD (Confirm with patient/family/NH records and if not entered, this has to be entered at [facility] point of entry) Patient coming from: Home  I have personally briefly reviewed patient's old medical records in [facility]  Chief Complaint: SOB, edema  HPI: Samuel Choi is a 57 y.o. male with medical history significant of COPD Gold stage II, nocturnal hypoxia on 2 L HS, and severe pulmonary hypertension, squamous cell carcinoma of right lung status post carboplatin Taxol and Keytruda 2022, history of DVT and PE on Eliquis, HTN, chronic HFpEF, presented with worsening of shortness of breath and peripheral edema.  Symptoms started 2 weeks ago, patient started develop exertional dyspnea, gradually getting worse, unable to sleep laying flat on the bed for last 2 nights.  Denies any cough no chest pains no fever or chills.  Meantime he started noticed swelling of bilateral lower extremities and abdominal wall, denied any nauseous vomiting abdominal pain.  ED Course: Temperature 98.4, pulse 91, breathing rate 20, blood pressure 119/78, O2 saturation 84% on room air stabilized on 2 L.  Chest x-ray showed no acute infiltrates, blood work showed creatinine 1.0 bicarbonate 29, K3.0. RUQ U/S showed dilated gallbladder without stones and 10 mm thickening of gallbladder wall with wall edema.  AST 27, ALT 65, alkaline phosphatase 139, total bilirubin 1.4.  RSV negative COVID and flu negative.  Patient was given IV Solu-Medrol, DuoNebs x 2 and IV Lasix x 1 in the ED.  Review of Systems: As per HPI otherwise 14 point review of systems negative.    Past Medical History:  Diagnosis Date   Alcohol abuse    usually drinks 2-3 drinks per day   Atherosclerosis 06/2018   Cancer associated pain    Chronic sinusitis    Dehydration 02/07/2019   Emphysema of lung (HCC) 06/2018   patient unaware of  this.   Hip fracture (HCC) 05/2018   no surgery   History of kidney stones 05/2018   per xray, bilateral nephrolitiasis   Hypertension    Squamous cell carcinoma of lung, right (HCC) 06/2018    Past Surgical History:  Procedure Laterality Date   BRAIN SURGERY  10/2017   nasal/sinus endoscopy. mass benign   ELECTROMAGNETIC NAVIGATION BROCHOSCOPY Right 06/25/2018   Procedure: ELECTROMAGNETIC NAVIGATION BRONCHOSCOPY;  Surgeon: Erin Fulling, MD;  Location: [facility] ORS;  Service: Cardiopulmonary;  Laterality: Right;   ENDOBRONCHIAL ULTRASOUND Right 06/25/2018   Procedure: ENDOBRONCHIAL ULTRASOUND;  Surgeon: Erin Fulling, MD;  Location: [facility];  Service: Cardiopulmonary;  Laterality: Right;   ENDOBRONCHIAL ULTRASOUND Right 12/26/2018   Procedure: ENDOBRONCHIAL ULTRASOUND RIGHT;  Surgeon: Erin Fulling, MD;  Location: [facility];  Service: Cardiopulmonary;  Laterality: Right;   FLEXIBLE BRONCHOSCOPY N/A 08/20/2018   Procedure: FLEXIBLE BRONCHOSCOPY PREOP;  Surgeon: Hulda Marin, MD;  Location: [facility];  Service: Thoracic;  Laterality: N/A;   IR CV LINE INJECTION  04/04/2019   NASAL SINUS SURGERY  10/2017   At [facility], frontal sinusotomy, ethmoidectomy, resection anterior cranial fossa neoplasm, turbinate resection   PERIPHERAL VASCULAR THROMBECTOMY Left 12/31/2020   Procedure: PERIPHERAL VASCULAR THROMBECTOMY;  Surgeon: Annice Needy, MD;  Location: [facility];  Service: Cardiovascular;  Laterality: Left;   PORTACATH PLACEMENT Left 01/15/2019   Procedure: INSERTION PORT-A-CATH;  Surgeon: Hulda Marin, MD;  Location: [facility];  Service: General;  Laterality: Left;   THORACOTOMY Right 08/13/2018   Procedure: PREOP BROCHOSCOPY  WITH RIGHT THORACOTOMY AND RUL RESECTION;  Surgeon: Hulda Marin, MD;  Location: [facility];  Service: General;  Laterality: Right;   THORACOTOMY Right 08/20/2018   Procedure: THORACOTOMY MAJOR RIGHT UPPER LOBE LOBECTOMY;  Surgeon: Hulda Marin, MD;  Location: [facility];   Service: Thoracic;  Laterality: Right;   TOE SURGERY Left    pin in left toe     reports that he quit smoking about 4 years ago. His smoking use included cigarettes. He started smoking about 19 years ago. He has a 7.5 pack-year smoking history. He has never used smokeless tobacco. He reports current alcohol use of about 3.0 standard drinks of alcohol per week. He reports that he does not use drugs.  No Known Allergies  Family History  Problem Relation Age of Onset   Breast cancer Mother    Diabetes Mother    Lung cancer Father    Hypertension Father      Prior to Admission medications   Medication Sig Start Date End Date Taking? Authorizing Provider  albuterol (PROVENTIL) (2.5 MG/3ML) 0.083% nebulizer solution Take 3 mLs (2.5 mg total) by nebulization every 6 (six) hours as needed for wheezing or shortness of breath. 06/15/23   Leeroy Bock, MD  albuterol (VENTOLIN HFA) 108 (90 Base) MCG/ACT inhaler INHALE 2 PUFFS BY MOUTH EVERY 6 HOURS AS NEEDED FOR WHEEZING OR SHORTNESS OF BREATH 06/15/23   Leeroy Bock, MD  apixaban (ELIQUIS) 2.5 MG TABS tablet Take 1 tablet (2.5 mg total) by mouth 2 (two) times daily. 06/15/23 07/15/23  Leeroy Bock, MD  benzonatate (TESSALON) 100 MG capsule Take 1 capsule (100 mg total) by mouth 3 (three) times daily as needed for cough. 06/27/22   Borders, Daryl Eastern, NP  Budeson-Glycopyrrol-Formoterol (BREZTRI AEROSPHERE) 160-9-4.8 MCG/ACT AERO Inhale 2 puffs into the lungs in the morning and at bedtime. 06/15/23   Leeroy Bock, MD  cloNIDine (CATAPRES) 0.1 MG tablet Take 1 tablet (0.1 mg total) by mouth 3 (three) times daily. 06/15/23 06/14/24  Leeroy Bock, MD  cyanocobalamin (VITAMIN B12) 1000 MCG tablet Take 1 tablet (1,000 mcg total) by mouth daily. 06/27/22   Borders, Daryl Eastern, NP  diltiazem (CARDIZEM CD) 120 MG 24 hr capsule Take 120 mg by mouth daily. 06/19/19   [provider]  feeding supplement (ENSURE ENLIVE / ENSURE  PLUS) LIQD Take 237 mLs by mouth 3 (three) times daily between meals. 02/05/22   Sunnie Nielsen, DO  latanoprost (XALATAN) 0.005 % ophthalmic solution Place 1 drop into both eyes at bedtime. 05/04/21   [provider]  loratadine (CLARITIN) 10 MG tablet TAKE 1 TABLET(10 MG) BY MOUTH DAILY AS NEEDED FOR ALLERGIES 02/12/23   Rickard Patience, MD  magnesium chloride (SLOW-MAG) 64 MG TBEC SR tablet Take 1 tablet (64 mg total) by mouth daily. 07/24/22   Rickard Patience, MD  methocarbamol (ROBAXIN) 500 MG tablet Take 1 tablet (500 mg total) by mouth at bedtime. 06/27/22   Borders, Daryl Eastern, NP  metoprolol succinate (TOPROL-XL) 25 MG 24 hr tablet Take 1 tablet (25 mg total) by mouth daily. 11/28/22   Rickard Patience, MD  Multiple Vitamin (MULTIVITAMIN WITH MINERALS) TABS tablet Take 1 tablet by mouth daily. 02/05/22   Sunnie Nielsen, DO  ondansetron (ZOFRAN) 8 MG tablet Take 1 tablet (8 mg total) by mouth every 8 (eight) hours as needed for refractory nausea / vomiting. Start on day 3 after chemo. 06/27/22   Borders, Daryl Eastern, NP  pantoprazole (PROTONIX) 20 MG tablet  TAKE 1 TABLET BY MOUTH DAILY 02/23/23   Rickard Patience, MD  potassium chloride SA (KLOR-CON M) 20 MEQ tablet Take 1 tablet (20 mEq total) by mouth daily. 10/26/22   Rickard Patience, MD    Physical Exam: Vitals:   06/29/23 1109 06/29/23 1114 06/29/23 1207  BP: 119/78    Pulse: 91    Resp: 20    Temp: 98.4 F (36.9 C)    TempSrc: Oral    SpO2: (!) 84% 95%   Weight:   66.8 kg  Height:   6\' 1"  (1.854 m)    Constitutional: NAD, calm, comfortable Vitals:   06/29/23 1109 06/29/23 1114 06/29/23 1207  BP: 119/78    Pulse: 91    Resp: 20    Temp: 98.4 F (36.9 C)    TempSrc: Oral    SpO2: (!) 84% 95%   Weight:   66.8 kg  Height:   6\' 1"  (1.854 m)   Eyes: PERRL, lids and conjunctivae normal ENMT: Mucous membranes are moist. Posterior pharynx clear of any exudate or lesions.Normal dentition.  Neck: normal, supple, no masses, no thyromegaly Respiratory:  clear to auscultation bilaterally, scattered wheezing, no crackles. Normal respiratory effort. No accessory muscle use.  Cardiovascular: Regular rate and rhythm, no murmurs / rubs / gallops.  2+ bilateral pitting edema to the level of bilateral knees. 2+ pedal pulses. No carotid bruits.  Abdomen: no tenderness, no masses palpated. No hepatosplenomegaly. Bowel sounds positive.  Musculoskeletal: no clubbing / cyanosis. No joint deformity upper and lower extremities. Good ROM, no contractures. Normal muscle tone.  Skin: no rashes, lesions, ulcers. No induration Neurologic: CN 2-12 grossly intact. Sensation intact, DTR normal. Strength 5/5 in all 4.  Psychiatric: Normal judgment and insight. Alert and oriented x 3. Normal mood.     Labs on Admission: I have personally reviewed following labs and imaging studies  CBC: Recent Labs  Lab 06/29/23 1119  WBC 14.5*  HGB 13.0  HCT 38.8*  MCV 104.9*  PLT 138*   Basic Metabolic Panel: Recent Labs  Lab 06/29/23 1119  NA 142  K 3.0*  CL 104  CO2 20*  GLUCOSE 55*  BUN 18  CREATININE 0.84  CALCIUM 8.4*  MG 1.6*   GFR: Estimated Creatinine Clearance: 91.7 mL/min (by C-G formula based on SCr of 0.84 mg/dL). Liver Function Tests: Recent Labs  Lab 06/29/23 1119  AST 27  ALT 65*  ALKPHOS 139*  BILITOT 1.4*  PROT 6.8  ALBUMIN 3.2*   Recent Labs  Lab 06/29/23 1119  LIPASE 25   No results for input(s): "AMMONIA" in the last 168 hours. Coagulation Profile: Recent Labs  Lab 06/29/23 1119  INR 1.4*   Cardiac Enzymes: No results for input(s): "CKTOTAL", "CKMB", "CKMBINDEX", "TROPONINI" in the last 168 hours. BNP (last 3 results) No results for input(s): "PROBNP" in the last 8760 hours. HbA1C: No results for input(s): "HGBA1C" in the last 72 hours. CBG: No results for input(s): "GLUCAP" in the last 168 hours. Lipid Profile: No results for input(s): "CHOL", "HDL", "LDLCALC", "TRIG", "CHOLHDL", "LDLDIRECT" in the last 72  hours. Thyroid Function Tests: No results for input(s): "TSH", "T4TOTAL", "FREET4", "T3FREE", "THYROIDAB" in the last 72 hours. Anemia Panel: No results for input(s): "VITAMINB12", "FOLATE", "FERRITIN", "TIBC", "IRON", "RETICCTPCT" in the last 72 hours. Urine analysis:    Component Value Date/Time   COLORURINE Yellow 11/02/2014 1900   APPEARANCEUR Hazy 11/02/2014 1900   LABSPEC 1.013 11/02/2014 1900   PHURINE 9.0 11/02/2014 1900   GLUCOSEU  Negative 11/02/2014 1900   HGBUR 1+ 11/02/2014 1900   BILIRUBINUR Negative 11/02/2014 1900   KETONESUR Negative 11/02/2014 1900   PROTEINUR 30 mg/dL 16/04/9603 5409   NITRITE Negative 11/02/2014 1900   LEUKOCYTESUR 3+ 11/02/2014 1900    Radiological Exams on Admission: DG Chest 2 View Result Date: 06/29/2023 CLINICAL DATA:  Shortness of breath for 2 weeks. EXAM: CHEST - 2 VIEW COMPARISON:  X-ray and CT angiogram 06/14/2023. FINDINGS: Stable left subclavian chest port with the tip along the central SVC. Persistent masslike thickening and distortion right perihilar as well as a small right pleural effusion. Left lung is grossly clear. Chronic lung changes. No pneumothorax. Enlarged cardiopericardial silhouette. Overlapping cardiac leads. Film is under penetrated. IMPRESSION: No significant interval change when adjusting for technique. Electronically Signed   By: Karen Kays M.D.   On: 06/29/2023 13:17   US ABDOMEN LIMITED RUQ (LIVER/GB) Result Date: 06/29/2023 CLINICAL DATA:  Swelling EXAM: ULTRASOUND ABDOMEN LIMITED RIGHT UPPER QUADRANT COMPARISON:  CT 02/23/2023. FINDINGS: Gallbladder: Distended gallbladder with significant wall thickening up to 10 mm. Wall edema identified. No obvious stones. No reported sonographic Murphy's sign. Common bile duct: Diameter: 2 mm Liver: No focal lesion identified. Within normal limits in parenchymal echogenicity. Portal vein is patent on color Doppler imaging with normal direction of blood flow towards the liver.  Other: None. IMPRESSION: Dilated gallbladder without stones however there is severe gallbladder wall thickening of the 10 mm and some wall edema. This is of uncertain etiology. Further workup as clinically appropriate. No ductal dilatation. Electronically Signed   By: Karen Kays M.D.   On: 06/29/2023 13:15    EKG: Independently reviewed.  Sinus rhythm, chronic nonspecific ST changes on multiple leads  Assessment/Plan Principal Problem:   CHF (congestive heart failure) (HCC) Active Problems:   Chronic diastolic CHF (congestive heart failure) (HCC)  (please populate well all problems here in Problem List. (For example, if patient is on BP meds at home and you resume or decide to hold them, it is a problem that needs to be her. Same for CAD, COPD, HLD and so on)  Acute on chronic severe pulmonary HTN decompensation Acute on chronic HFpEF decompensation -Clinically patient has significant fluid overload symptoms and signs -Continue IV diuresis Lasix IV 40 mg twice daily -Given there is a significant worsening of fluid balance on recent weeks, will order echocardiogram.  Echocardiogram from last year was reviewed. -Hold off clonidine and Cardizem due to CHF decompensation -Other DDx, low suspicion for DVT as patient on chronic Eliquis therapy -For severe pulmonary hypertension, recommend patient follow-up with pulmonary hypertension program at [facility].  Acute COPD exacerbation -Mild exacerbation, plan to continue p.o. steroid -ICS and LABA -DuoNebs and as needed albuterol  Hypokalemia -P.o. replacement, recheck level tomorrow  Hypomagnesemia -IV replacement  Abnormal RUQ ultrasound -Denied any RUQ symptoms, LFTs largely normal. Clinically suspect, the finding of gallbladder wall thickening/edema is part of the systemic peripheral edema from right-sided CHF decompensation/pulmonary hypertension decompensation.  History of PE and DVT -Continue Eliquis  HTN -Blood pressure borderline  low, plan to continue metoprolol -Given there is a significant S/S of CHF exacerbation, will hold off clonidine and Cardizem  DVT prophylaxis: Eliquis Code Status: Full code Family Communication: Wife at bedside Disposition Plan: Patient sick with significant fluid overload from CHF decompensation requiring IV diuresis and repeat cardiac study, expect more than 2 midnight hospital stay Consults called: None Admission status: Telemetry admission   Emeline General MD [facility] Pager [phone]  06/29/2023, 3:19 PM

## 2023-06-29 NOTE — ED Triage Notes (Signed)
Pt here with SOB x2 weeks. Pt states he is having trouble getting his breath in and out. Pt also has a taut abd that he noticed when his SOB started. Pt denies cp. Pt is a drinker.

## 2023-06-30 ENCOUNTER — Inpatient Hospital Stay
Admit: 2023-06-30 | Discharge: 2023-06-30 | Disposition: A | Discharge status: Home / Self Care | Payer: Medicare Other | Attending: Internal Medicine

## 2023-06-30 DIAGNOSIS — I5021 Acute systolic (congestive) heart failure: Secondary | ICD-10-CM | POA: Diagnosis not present

## 2023-06-30 LAB — BASIC METABOLIC PANEL
Anion gap: 12 (ref 5–15)
BUN: 25 mg/dL — ABNORMAL HIGH (ref 6–20)
CO2: 27 mmol/L (ref 22–32)
Calcium: 8.5 mg/dL — ABNORMAL LOW (ref 8.9–10.3)
Chloride: 98 mmol/L (ref 98–111)
Creatinine, Ser: 1.1 mg/dL (ref 0.61–1.24)
GFR, Estimated: >60 mL/min (ref >60)
Glucose, Bld: 132 mg/dL — ABNORMAL HIGH (ref 70–99)
Potassium: 3.7 mmol/L (ref 3.5–5.1)
Sodium: 137 mmol/L (ref 135–145)

## 2023-06-30 LAB — ECHOCARDIOGRAM COMPLETE
AR max vel: 2.5 cm2
AV Peak grad: 6.5 mm[Hg]
Ao pk vel: 1.27 m/s
Area-P 1/2: 6.17 cm2
Height: 73 in
S' Lateral: 2.4 cm
Weight: 2356.28 [oz_av]

## 2023-06-30 LAB — HIV ANTIBODY (ROUTINE TESTING W REFLEX): HIV Screen 4th Generation wRfx: NONREACTIVE

## 2023-06-30 MED ORDER — SENNOSIDES-DOCUSATE SODIUM 8.6-50 MG PO TABS: 1.0000 | ORAL_TABLET | Freq: Every evening | ORAL | Status: DC | PRN

## 2023-06-30 MED ORDER — IPRATROPIUM-ALBUTEROL 0.5-2.5 (3) MG/3ML IN SOLN: 3.0000 mL | RESPIRATORY_TRACT | Status: DC | PRN

## 2023-06-30 MED ORDER — METOPROLOL TARTRATE 5 MG/5ML IV SOLN: 5.0000 mg | INTRAVENOUS | Status: DC | PRN

## 2023-06-30 MED ORDER — ONDANSETRON HCL 4 MG/2ML IJ SOLN: 4.0000 mg | Freq: Four times a day (QID) | INTRAMUSCULAR | Status: DC | PRN

## 2023-06-30 MED ORDER — HYDRALAZINE HCL 20 MG/ML IJ SOLN: 10.0000 mg | INTRAMUSCULAR | Status: DC | PRN

## 2023-06-30 NOTE — Hospital Course (Addendum)
Brief Narrative:  57 year old with history of COPD, nocturnal hypoxia 2 L nasal cannula, severe pulmonary hypertension, squamous cell carcinoma of the right lung status post chemotherapy and immunotherapy in 2022, DVT/PE on Eliquis, CHF with preserved EF, HTN admitted for worsening shortness of breath and peripheral edema.  Symptoms started about 2 weeks ago but worsened over the last couple of days.  Found to be hypoxic in the ER with signs of volume overload therefore admitted to the hospital.   Assessment & Plan:  Principal Problem:   CHF (congestive heart failure) (HCC) Active Problems:   Chronic diastolic CHF (congestive heart failure) (HCC)     Acute on chronic severe pulmonary HTN decompensation Acute on chronic HFpEF decompensation Clinical signs of volume overload, on IV Lasix twice daily.  Echocardiogram in the past has shown preserved EF with severe PASP.  Repeat echocardiogram ordered - Should follow-up at [facility] for pulmonary hypertension   Acute COPD exacerbation Mild.  Continue steroids, I-S/flutter valve -ICS and LABA -DuoNebs and as needed albuterol   Hypokalemia/hypomagnesemia -Replete as needed   Abnormal RUQ ultrasound -Denied any RUQ symptoms, LFTs largely normal. Clinically suspect, the finding of gallbladder wall thickening/edema is part of the systemic peripheral edema from right-sided CHF decompensation/pulmonary hypertension decompensation.   History of PE and DVT -Continue Eliquis   HTN Soft blood pressure.  Continue Toprol-XL holding off on clonidine and Cardizem.  Echocardiogram ordered.  IV as needed  DVT prophylaxis: Eliquis    Code Status: Limited: Do not attempt resuscitation (DNR) -DNR-LIMITED -Do Not Intubate/DNI  Family Communication:   Status is: Inpatient Remains inpatient appropriate because: Continue hospital stay Continue IV diuretics.  Hopefully home tomorrow   Subjective: Feels better.  Still having some exertional  dyspnea.   Examination:  General exam: Appears calm and comfortable, 3 L nasal cannula Respiratory system: Bilateral expiratory wheezing at the bases Cardiovascular system: S1 & S2 heard, RRR. No JVD, murmurs, rubs, gallops or clicks. No pedal edema. Gastrointestinal system: Abdomen is nondistended, soft and nontender. No organomegaly or masses felt. Normal bowel sounds heard. Central nervous system: Alert and oriented. No focal neurological deficits. Extremities: Symmetric 5 x 5 power. Skin: No rashes, lesions or ulcers Psychiatry: Judgement and insight appear normal. Mood & affect appropriate.

## 2023-06-30 NOTE — Progress Notes (Signed)
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  Symptoms started about 2 weeks ago but worsened over the last couple of days.  Found to be hypoxic in the ER with signs of volume overload therefore admitted to the hospital.   Assessment & Plan:  Principal Problem:   CHF (congestive heart failure) (HCC) Active Problems:   Chronic diastolic CHF (congestive heart failure) (HCC)     Acute on chronic severe pulmonary HTN decompensation Acute on chronic HFpEF decompensation Clinical signs of volume overload, on IV Lasix twice daily.  Echocardiogram in the past has shown preserved EF with severe PASP.  Repeat echocardiogram ordered - Should follow-up at [facility] for pulmonary hypertension   Acute COPD exacerbation Mild.  Continue steroids, I-S/flutter valve -ICS and LABA -DuoNebs and as needed albuterol   Hypokalemia/hypomagnesemia -Replete as needed   Abnormal RUQ ultrasound -Denied any RUQ symptoms, LFTs largely normal. Clinically suspect, the finding of gallbladder wall thickening/edema is part of the systemic peripheral edema from right-sided CHF decompensation/pulmonary hypertension decompensation.   History of PE and DVT -Continue Eliquis   HTN Soft blood pressure.  Continue Toprol-XL holding off on clonidine and Cardizem.  Echocardiogram ordered.  IV as needed  DVT prophylaxis: Eliquis    Code Status: Limited: Do not attempt resuscitation (DNR) -DNR-LIMITED -Do Not Intubate/DNI  Family Communication:   Status is: Inpatient Remains inpatient appropriate because: Continue hospital stay Continue IV  diuretics.  Hopefully home tomorrow   Subjective: Feels better.  Still having some exertional dyspnea.   Examination:  General exam: Appears calm and comfortable, 3 L nasal cannula Respiratory system: Bilateral expiratory wheezing at the bases Cardiovascular system: S1 & S2 heard, RRR. No JVD, murmurs, rubs, gallops or clicks. No pedal edema. Gastrointestinal system: Abdomen is nondistended, soft and nontender. No organomegaly or masses felt. Normal bowel sounds heard. Central nervous system: Alert and oriented. No focal neurological deficits. Extremities: Symmetric 5 x 5 power. Skin: No rashes, lesions or ulcers Psychiatry: Judgement and insight appear normal. Mood & affect appropriate.                Diet Orders (From admission, onward)     Start     Ordered   06/29/23 1451  Diet Heart Room service appropriate? Yes; Fluid consistency: Thin  Diet effective now       Question Answer Comment  Room service appropriate? Yes   Fluid consistency: Thin      06/29/23 1454            Objective: Vitals:   06/30/23 0500 06/30/23 0800 06/30/23 0900 06/30/23 1100  BP: (!) 155/95 (!) 162/106  (!) 166/106  Pulse: 89 91  (!) 186  Resp: 17 14  (!) 21  Temp:   98.5 F (36.9 C)   TempSrc:   Oral   SpO2: 96% 97%  100%  Weight:      Height:        Intake/Output Summary (Last 24 hours) at 06/30/2023 1155 Last data filed at 06/29/2023 2129 Gross per 24 hour  Intake 100 ml  Output --  Net 100 ml   [employer]   06/29/23  1207  Weight: 66.8 kg    Scheduled Meds:  apixaban  2.5 mg Oral BID   feeding supplement  237 mL Oral TID BM   fluticasone furoate-vilanterol  1 puff Inhalation Daily   furosemide  40 mg Intravenous BID   latanoprost  1 drop Both Eyes QHS   loratadine  10 mg Oral Daily   magnesium chloride  1 tablet Oral Daily   methocarbamol  500 mg Oral QHS   metoprolol succinate  50 mg Oral Daily   pantoprazole  20 mg Oral Daily   predniSONE  40 mg Oral  Q breakfast   sodium chloride flush  3 mL Intravenous Q12H   Continuous Infusions:  sodium chloride      Nutritional status     Body mass index is 19.43 kg/m.  Data Reviewed:   CBC: Recent Labs  Lab 06/29/23 1119  WBC 14.5*  HGB 13.0  HCT 38.8*  MCV 104.9*  PLT 138*   Basic Metabolic Panel: Recent Labs  Lab 06/29/23 1119 06/30/23 0319  NA 142 137  K 3.0* 3.7  CL 104 98  CO2 20* 27  GLUCOSE 55* 132*  BUN 18 25*  CREATININE 0.84 1.10  CALCIUM 8.4* 8.5*  MG 1.6*  --    GFR: Estimated Creatinine Clearance: 70 mL/min (by C-G formula based on SCr of 1.1 mg/dL). Liver Function Tests: Recent Labs  Lab 06/29/23 1119  AST 27  ALT 65*  ALKPHOS 139*  BILITOT 1.4*  PROT 6.8  ALBUMIN 3.2*   Recent Labs  Lab 06/29/23 1119  LIPASE 25   No results for input(s): "AMMONIA" in the last 168 hours. Coagulation Profile: Recent Labs  Lab 06/29/23 1119  INR 1.4*   Cardiac Enzymes: No results for input(s): "CKTOTAL", "CKMB", "CKMBINDEX", "TROPONINI" in the last 168 hours. BNP (last 3 results) No results for input(s): "PROBNP" in the last 8760 hours. HbA1C: No results for input(s): "HGBA1C" in the last 72 hours. CBG: Recent Labs  Lab 06/29/23 1551  GLUCAP 74   Lipid Profile: No results for input(s): "CHOL", "HDL", "LDLCALC", "TRIG", "CHOLHDL", "LDLDIRECT" in the last 72 hours. Thyroid Function Tests: No results for input(s): "TSH", "T4TOTAL", "FREET4", "T3FREE", "THYROIDAB" in the last 72 hours. Anemia Panel: No results for input(s): "VITAMINB12", "FOLATE", "FERRITIN", "TIBC", "IRON", "RETICCTPCT" in the last 72 hours. Sepsis Labs: No results for input(s): "PROCALCITON", "LATICACIDVEN" in the last 168 hours.  Recent Results (from the past 240 hours)  Resp panel by RT-PCR (RSV, Flu A&B, Covid) Anterior Nasal Swab     Status: None   Collection Time: 06/29/23 11:19 AM   Specimen: Anterior Nasal Swab  Result Value Ref Range Status   SARS Coronavirus 2 by RT  PCR NEGATIVE NEGATIVE Final    Comment: (NOTE) SARS-CoV-2 target nucleic acids are NOT DETECTED.  The SARS-CoV-2 RNA is generally detectable in upper respiratory specimens during the acute phase of infection. The lowest concentration of SARS-CoV-2 viral copies this assay can detect is 138 copies/mL. A negative result does not preclude SARS-Cov-2 infection and should not be used as the sole basis for treatment or other patient management decisions. A negative result may occur with  improper specimen collection/handling, submission of specimen other than nasopharyngeal swab, presence of viral mutation(s) within the areas targeted by this assay, and inadequate number of viral copies(<138 copies/mL). A negative result must be combined with clinical observations, patient history, and epidemiological information. The expected result is Negative.  Fact Sheet for Patients:  BloggerCourse.com  Fact  Sheet for Healthcare Providers:  SeriousBroker.it  This test is no t yet approved or cleared by the [place] FDA and  has been authorized for detection and/or diagnosis of SARS-CoV-2 by FDA under an Emergency Use Authorization (EUA). This EUA will remain  in effect (meaning this test can be used) for the duration of the COVID-19 declaration under Section 564(b)(1) of the Act, 21 U.S.C.section 360bbb-3(b)(1), unless the authorization is terminated  or revoked sooner.       Influenza A by PCR NEGATIVE NEGATIVE Final   Influenza B by PCR NEGATIVE NEGATIVE Final    Comment: (NOTE) The Xpert Xpress SARS-CoV-2/FLU/RSV plus assay is intended as an aid in the diagnosis of influenza from Nasopharyngeal swab specimens and should not be used as a sole basis for treatment. Nasal washings and aspirates are unacceptable for Xpert Xpress SARS-CoV-2/FLU/RSV testing.  Fact Sheet for Patients: BloggerCourse.com  Fact Sheet for  Healthcare Providers: SeriousBroker.it  This test is not yet approved or cleared by the [place] FDA and has been authorized for detection and/or diagnosis of SARS-CoV-2 by FDA under an Emergency Use Authorization (EUA). This EUA will remain in effect (meaning this test can be used) for the duration of the COVID-19 declaration under Section 564(b)(1) of the Act, 21 U.S.C. section 360bbb-3(b)(1), unless the authorization is terminated or revoked.     Resp Syncytial Virus by PCR NEGATIVE NEGATIVE Final    Comment: (NOTE) Fact Sheet for Patients: BloggerCourse.com  Fact Sheet for Healthcare Providers: SeriousBroker.it  This test is not yet approved or cleared by the [place] FDA and has been authorized for detection and/or diagnosis of SARS-CoV-2 by FDA under an Emergency Use Authorization (EUA). This EUA will remain in effect (meaning this test can be used) for the duration of the COVID-19 declaration under Section 564(b)(1) of the Act, 21 U.S.C. section 360bbb-3(b)(1), unless the authorization is terminated or revoked.  Performed at [facility], [address], [place], [place] [address]          Radiology Studies: DG Chest 2 View Result Date: 06/29/2023 CLINICAL DATA:  Shortness of breath for 2 weeks. EXAM: CHEST - 2 VIEW COMPARISON:  X-ray and CT angiogram 06/14/2023. FINDINGS: Stable left subclavian chest port with the tip along the central SVC. Persistent masslike thickening and distortion right perihilar as well as a small right pleural effusion. Left lung is grossly clear. Chronic lung changes. No pneumothorax. Enlarged cardiopericardial silhouette. Overlapping cardiac leads. Film is under penetrated. IMPRESSION: No significant interval change when adjusting for technique. Electronically Signed   By: Karen Kays M.D.   On: 06/29/2023 13:17   US ABDOMEN LIMITED RUQ  (LIVER/GB) Result Date: 06/29/2023 CLINICAL DATA:  Swelling EXAM: ULTRASOUND ABDOMEN LIMITED RIGHT UPPER QUADRANT COMPARISON:  CT 02/23/2023. FINDINGS: Gallbladder: Distended gallbladder with significant wall thickening up to 10 mm. Wall edema identified. No obvious stones. No reported sonographic Murphy's sign. Common bile duct: Diameter: 2 mm Liver: No focal lesion identified. Within normal limits in parenchymal echogenicity. Portal vein is patent on color Doppler imaging with normal direction of blood flow towards the liver. Other: None. IMPRESSION: Dilated gallbladder without stones however there is severe gallbladder wall thickening of the 10 mm and some wall edema. This is of uncertain etiology. Further workup as clinically appropriate. No ductal dilatation. Electronically Signed   By: Karen Kays M.D.   On: 06/29/2023 13:15           LOS: 1 day   Time spent= 35 mins  Miguel Rota, MD [facility]  If 7PM-7AM, please contact night-coverage  06/30/2023, 11:55 AM

## 2023-06-30 NOTE — ED Notes (Signed)
Provided warm blanket and juice, no other needs stated at this time

## 2023-06-30 NOTE — Evaluation (Signed)
Occupational Therapy Evaluation Patient Details Name: Samuel Choi MRN: [id number] DOB: [birth year] Today's Date: 06/30/2023   History of Present Illness Samuel Choi is a 57 y.o. male with medical history significant of COPD Gold stage II, nocturnal hypoxia on 2 L HS, and severe pulmonary hypertension, squamous cell carcinoma of right lung status post carboplatin Taxol and Keytruda 2022, history of DVT and PE on Eliquis, HTN, chronic HFpEF, presented with worsening of shortness of breath and peripheral edema.     Symptoms started 2 weeks ago, patient started develop exertional dyspnea, gradually getting worse, unable to sleep laying flat on the bed for last 2 nights.  Denies any cough no chest pains no fever or chills.  Meantime he started noticed swelling of bilateral lower extremities and abdominal wall, denied any nauseous vomiting abdominal pain.   Clinical Impression   Patient received for OT evaluation. See flowsheet below for details of function. Generally, patient (I) for bed mobility, (I) for functional mobility, and (I) for ADLs. Has increased O2 need from baseline, but can be medically managed.  Patient with no further need for OT in acute care; discharge OT services.        If plan is discharge home, recommend the following:      Functional Status Assessment  Patient has not had a recent decline in their functional status  Equipment Recommendations  None recommended by OT    Recommendations for Other Services       Precautions / Restrictions Precautions Precautions: Other (comment) (oxygen) Restrictions Weight Bearing Restrictions Per Provider Order: No      Mobility Bed Mobility Overal bed mobility: Independent                  Transfers Overall transfer level: Independent                        Balance Overall balance assessment: Independent                                         ADL either performed or assessed with  clinical judgement   ADL Overall ADL's : Modified independent                                       General ADL Comments: Pt able to don BIL slippers that were in the room. Declined need for toileting or other ADLs, but able to rise from EOB without AD or assistance , so anticipate no difficulty with toilet transfers. Able to mobilize without AD down hallway (OT managing O2) with 3L O2. O2 dropping from 97-98% to 90% with activity, but recovering within 2 minutes with pursed lip breathing. Prior to mobility, at EOB on 3L, O2 98%; OT removed oxygen and pt desaturated to 86%; replaced O2. HR while seated 95; HR while mobilizing 111. BP while seated EOB pre-activity 160/106; OT cleared mobility with RN prior due to high blood pressure.     Vision Baseline Vision/History: 1 Wears glasses Ability to See in Adequate Light: 0 Adequate       Perception         Praxis         Pertinent Vitals/Pain Pain Assessment Pain Assessment: No/denies pain     Extremity/Trunk Assessment Upper Extremity Assessment Upper  Extremity Assessment: Overall WFL for tasks assessed   Lower Extremity Assessment Lower Extremity Assessment: Overall WFL for tasks assessed   Cervical / Trunk Assessment Cervical / Trunk Assessment: Normal   Communication Communication Communication: No apparent difficulties   Cognition Arousal: Alert Behavior During Therapy: WFL for tasks assessed/performed Overall Cognitive Status: Within Functional Limits for tasks assessed                                 General Comments: Flat affect, but cooperative.     General Comments  See ADL section for vitals during session.    Exercises     Shoulder Instructions      Home Living Family/patient expects to be discharged to:: Private residence Living Arrangements: Alone Available Help at Discharge: Family;Available PRN/intermittently Type of Home: House Home Access: Stairs to enter Entrance  Stairs-Number of Steps: 3   Home Layout: Two level;Able to live on main level with bedroom/bathroom     Bathroom Shower/Tub: Chief Strategy Officer: Standard     Home Equipment: Agricultural consultant (2 wheels)          Prior Functioning/Environment Prior Level of Function : Independent/Modified Independent;Driving             Mobility Comments: Not using AD for mobility. Denies falls. ADLs Comments: (I)ADL, driving, folds laundry. Using O2 2L nocturnal and PRN during day. States he fatigues quickly with activity.        OT Problem List:        OT Treatment/Interventions:      OT Goals(Current goals can be found in the care plan section) Acute Rehab OT Goals Patient Stated Goal: Go home  OT Frequency:      Co-evaluation              AM-PAC OT "6 Clicks" Daily Activity     Outcome Measure Help from another person eating meals?: None Help from another person taking care of personal grooming?: None Help from another person toileting, which includes using toliet, bedpan, or urinal?: None Help from another person bathing (including washing, rinsing, drying)?: None Help from another person to put on and taking off regular upper body clothing?: None Help from another person to put on and taking off regular lower body clothing?: None 6 Click Score: 24   End of Session Equipment Utilized During Treatment: Oxygen Nurse Communication: Mobility status;Other (comment) (pt's BP)  Activity Tolerance: Other (comment) (limited by oxygen and shortness of breath) Patient left: in bed;with call bell/phone within reach (pt seated EOB)                   Time: 1610-9604 OT Time Calculation (min): 8 min Charges:  OT General Charges $OT Visit: 1 Visit OT Evaluation $OT Eval Moderate Complexity: 1 Mod  Lyndall Bellot Junie Panning, MS, OTR/L  Alvester Morin 06/30/2023, 4:06 PM

## 2023-06-30 NOTE — Progress Notes (Signed)
  Echocardiogram 2D Echocardiogram has been performed.  Samuel Choi 06/30/2023, 12:55 PM

## 2023-07-01 DIAGNOSIS — I5020 Unspecified systolic (congestive) heart failure: Secondary | ICD-10-CM

## 2023-07-01 LAB — CBC
HCT: 36.8 % — ABNORMAL LOW (ref 39.0–52.0)
Hemoglobin: 12.7 g/dL — ABNORMAL LOW (ref 13.0–17.0)
MCH: 34.6 pg — ABNORMAL HIGH (ref 26.0–34.0)
MCHC: 34.5 g/dL (ref 30.0–36.0)
MCV: 100.3 fL — ABNORMAL HIGH (ref 80.0–100.0)
Platelets: 122 10*3/uL — ABNORMAL LOW (ref 150–400)
RBC: 3.67 MIL/uL — ABNORMAL LOW (ref 4.22–5.81)
RDW: 13.9 % (ref 11.5–15.5)
WBC: 18.9 10*3/uL — ABNORMAL HIGH (ref 4.0–10.5)
nRBC: 0 % (ref 0.0–0.2)

## 2023-07-01 LAB — BASIC METABOLIC PANEL
Anion gap: 12 (ref 5–15)
BUN: 27 mg/dL — ABNORMAL HIGH (ref 6–20)
CO2: 32 mmol/L (ref 22–32)
Calcium: 8.8 mg/dL — ABNORMAL LOW (ref 8.9–10.3)
Chloride: 90 mmol/L — ABNORMAL LOW (ref 98–111)
Creatinine, Ser: 0.92 mg/dL (ref 0.61–1.24)
GFR, Estimated: >60 mL/min (ref >60)
Glucose, Bld: 95 mg/dL (ref 70–99)
Potassium: 3.1 mmol/L — ABNORMAL LOW (ref 3.5–5.1)
Sodium: 134 mmol/L — ABNORMAL LOW (ref 135–145)

## 2023-07-01 LAB — PHOSPHORUS: Phosphorus: 2.6 mg/dL (ref 2.5–4.6)

## 2023-07-01 LAB — MAGNESIUM: Magnesium: 1.6 mg/dL — ABNORMAL LOW (ref 1.7–2.4)

## 2023-07-01 MED ORDER — MAGNESIUM OXIDE -MG SUPPLEMENT 400 (240 MG) MG PO TABS: 800.0000 mg | ORAL_TABLET | Freq: Two times a day (BID) | ORAL | 0 refills | Status: AC

## 2023-07-01 MED ORDER — MAGNESIUM OXIDE -MG SUPPLEMENT 400 (240 MG) MG PO TABS
800.0000 mg | ORAL_TABLET | ORAL | Status: DC
  Administered 2023-07-01: 800 mg via ORAL
  Filled 2023-07-01: qty 2

## 2023-07-01 MED ORDER — PREDNISONE 20 MG PO TABS: 40.0000 mg | ORAL_TABLET | Freq: Every day | ORAL | 0 refills | Status: DC

## 2023-07-01 MED ORDER — CYANOCOBALAMIN 500 MCG PO TABS
1000.0000 ug | ORAL_TABLET | Freq: Every day | ORAL | Status: DC
  Administered 2023-07-01: 1000 ug via ORAL
  Filled 2023-07-01: qty 2

## 2023-07-01 MED ORDER — POTASSIUM CHLORIDE CRYS ER 20 MEQ PO TBCR
40.0000 meq | EXTENDED_RELEASE_TABLET | ORAL | Status: DC
  Administered 2023-07-01: 40 meq via ORAL
  Filled 2023-07-01: qty 2

## 2023-07-01 MED ORDER — DILTIAZEM HCL ER COATED BEADS 120 MG PO CP24
120.0000 mg | ORAL_CAPSULE | Freq: Every day | ORAL | Status: DC
  Administered 2023-07-01: 120 mg via ORAL
  Filled 2023-07-01: qty 1

## 2023-07-01 MED ORDER — FUROSEMIDE 40 MG PO TABS: 40.0000 mg | ORAL_TABLET | Freq: Every day | ORAL | 0 refills | Status: AC | PRN

## 2023-07-01 MED ORDER — CLONIDINE HCL 0.1 MG PO TABS
0.1000 mg | ORAL_TABLET | Freq: Three times a day (TID) | ORAL | Status: DC
Start: 2023-07-01 — End: 2023-07-01
  Administered 2023-07-01: 0.1 mg via ORAL
  Filled 2023-07-01: qty 1

## 2023-07-01 NOTE — Discharge Summary (Signed)
Physician Discharge Summary  Samuel Choi:[id number] DOB: 1966/01/06 DOA: 06/29/2023  PCP: Barbette Reichmann, MD  Admit date: 06/29/2023 Discharge date: 07/01/2023  Admitted From: Home Disposition: Home  Recommendations for Outpatient Follow-up:  Follow up with PCP in 1-2 weeks Please obtain BMP/CBC in one week your next doctors visit.  Follow-up at [facility] for pulmonary hypertension Lasix 40 mg as needed 3 more days of p.o. 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  Symptoms started about 2 weeks ago but worsened over the last couple of days.  Found to be hypoxic in the ER with signs of volume overload therefore admitted to the hospital.  With diuresis patient did significantly well and was back to his baseline.  Ambulating without any hypoxia.   Assessment & Plan:  Principal Problem:   CHF (congestive heart failure) (HCC) Active Problems:   Chronic diastolic CHF (congestive heart failure) (HCC)     Acute on chronic severe pulmonary HTN decompensation Acute on chronic HFpEF decompensation Clinically euvolemic today, transition to p.o. Lasix as needed.  Echocardiogram this admission shows similar finding of preserved EF, elevated PASP - Should follow-up at [facility] for pulmonary hypertension   Acute COPD exacerbation Leukocytosis 3 more days of oral steroids.  Continue home bronchodilators.   Hypokalemia/hypomagnesemia -Replete as needed   Abnormal RUQ ultrasound -Denied any RUQ symptoms, LFTs largely normal. Clinically suspect, the finding of gallbladder wall thickening/edema is  part of the systemic peripheral edema from right-sided CHF decompensation/pulmonary hypertension decompensation.   History of PE and DVT -Continue Eliquis   HTN Now elevated, resume home medication.  Echocardiogram ordered.  IV as needed  Hypokalemia/hypomagnesemia - Repletion  DVT prophylaxis: Eliquis    Code Status: Limited: Do not attempt resuscitation (DNR) -DNR-LIMITED -Do Not Intubate/DNI  Family Communication:   Status is: Inpatient Remains inpatient appropriate because: Discharge today   Subjective: Feel well wishing to go home  Examination:  General exam: Appears calm and comfortable Respiratory system: Clear to auscultation bilaterally Cardiovascular system: S1 & S2 heard, RRR. No JVD, murmurs, rubs, gallops or clicks. No pedal edema. Gastrointestinal system: Abdomen is nondistended, soft and nontender. No organomegaly or masses felt. Normal bowel sounds heard. Central nervous system: Alert and oriented. No focal neurological deficits. Extremities: Symmetric 5 x 5 power. Skin: No rashes, lesions or ulcers Psychiatry: Judgement and insight appear normal. Mood & affect appropriate.    Discharge Diagnoses:  Principal Problem:   CHF (congestive heart failure) (HCC) Active Problems:   Chronic diastolic CHF (congestive heart failure) (HCC)        Discharge Exam: Vitals:   07/01/23 0700 07/01/23 1000  BP: (!) 173/112 (!) 174/113  Pulse: 88 88  Resp: 16   Temp:    SpO2: 100% 100%   Vitals:   07/01/23 0500 07/01/23 0600 07/01/23 0700 07/01/23 1000  BP:  (!) 171/108 (!) 173/112 (!) 174/113  Pulse:  86 88 88  Resp:  (!) 22 16   Temp: 98.3 F (36.8 C)     TempSrc: Oral     SpO2:  100% 100% 100%  Weight:      Height:        General: Pt  is alert, awake, not in acute distress Cardiovascular: RRR, S1/S2 +, no rubs, no gallops Respiratory: CTA bilaterally, no wheezing, no rhonchi Abdominal: Soft, NT, ND, bowel sounds + Extremities: no edema, no  cyanosis  Discharge Instructions   Allergies as of 07/01/2023   No Known Allergies      Medication List     STOP taking these medications    benzonatate 100 MG capsule Commonly known as: TESSALON   methocarbamol 500 MG tablet Commonly known as: ROBAXIN   ondansetron 8 MG tablet Commonly known as: Zofran       TAKE these medications    albuterol 108 (90 Base) MCG/ACT inhaler Commonly known as: VENTOLIN HFA INHALE 2 PUFFS BY MOUTH EVERY 6 HOURS AS NEEDED FOR WHEEZING OR SHORTNESS OF BREATH   albuterol (2.5 MG/3ML) 0.083% nebulizer solution Commonly known as: PROVENTIL Take 3 mLs (2.5 mg total) by nebulization every 6 (six) hours as needed for wheezing or shortness of breath.   apixaban 2.5 MG Tabs tablet Commonly known as: Eliquis Take 1 tablet (2.5 mg total) by mouth 2 (two) times daily.   Breztri Aerosphere 160-9-4.8 MCG/ACT Aero Generic drug: Budeson-Glycopyrrol-Formoterol Inhale 2 puffs into the lungs in the morning and at bedtime.   cloNIDine 0.1 MG tablet Commonly known as: CATAPRES Take 1 tablet (0.1 mg total) by mouth 3 (three) times daily.   cyanocobalamin 1000 MCG tablet Commonly known as: VITAMIN B12 Take 1 tablet (1,000 mcg total) by mouth daily.   diltiazem 120 MG 24 hr capsule Commonly known as: CARDIZEM CD Take 120 mg by mouth daily.   feeding supplement Liqd Take 237 mLs by mouth 3 (three) times daily between meals.   furosemide 40 MG tablet Commonly known as: Lasix Take 1 tablet (40 mg total) by mouth daily as needed for fluid or edema.   latanoprost 0.005 % ophthalmic solution Commonly known as: XALATAN Place 1 drop into both eyes at bedtime.   loratadine 10 MG tablet Commonly known as: CLARITIN TAKE 1 TABLET(10 MG) BY MOUTH DAILY AS NEEDED FOR ALLERGIES   magnesium chloride 64 MG Tbec SR tablet Commonly known as: SLOW-MAG Take 1 tablet (64 mg total) by mouth daily.   magnesium oxide 400 (240 Mg) MG tablet Commonly known as:  MAG-OX Take 2 tablets (800 mg total) by mouth 2 (two) times daily for 2 days.   metoprolol succinate 25 MG 24 hr tablet Commonly known as: TOPROL-XL Take 1 tablet (25 mg total) by mouth daily.   multivitamin with minerals Tabs tablet Take 1 tablet by mouth daily.   pantoprazole 20 MG tablet Commonly known as: PROTONIX TAKE 1 TABLET BY MOUTH DAILY   potassium chloride SA 20 MEQ tablet Commonly known as: KLOR-CON M Take 1 tablet (20 mEq total) by mouth daily.   predniSONE 20 MG tablet Commonly known as: DELTASONE Take 2 tablets (40 mg total) by mouth daily with breakfast for 3 days. Start taking on: July 02, 2023        Follow-up Information     Barbette Reichmann, MD Follow up in 1 week(s).   Specialty: Internal Medicine Contact information: [address] [place] [address] [phone]                No Known Allergies  You were cared for by a hospitalist during your hospital stay. If you have any questions about your discharge medications or the care you received while you were in the hospital after you are discharged,  you can call the unit and asked to speak with the hospitalist on call if the hospitalist that took care of you is not available. Once you are discharged, your primary care physician will handle any further medical issues. Please note that no refills for any discharge medications will be authorized once you are discharged, as it is imperative that you return to your primary care physician (or establish a relationship with a primary care physician if you do not have one) for your aftercare needs so that they can reassess your need for medications and monitor your lab values.  You were cared for by a hospitalist during your hospital stay. If you have any questions about your discharge medications or the care you received while you were in the hospital after you are discharged, you can call the unit and asked to speak with  the hospitalist on call if the hospitalist that took care of you is not available. Once you are discharged, your primary care physician will handle any further medical issues. Please note that NO REFILLS for any discharge medications will be authorized once you are discharged, as it is imperative that you return to your primary care physician (or establish a relationship with a primary care physician if you do not have one) for your aftercare needs so that they can reassess your need for medications and monitor your lab values.  Please request your Prim.MD to go over all Hospital Tests and Procedure/Radiological results at the follow up, please get all Hospital records sent to your Prim MD by signing hospital release before you go home.  Get CBC, CMP, 2 view Chest X ray checked  by Primary MD during your next visit or SNF MD in 5-7 days ( we routinely change or add medications that can affect your baseline labs and fluid status, therefore we recommend that you get the mentioned basic workup next visit with your PCP, your PCP may decide not to get them or add new tests based on their clinical decision)  On your next visit with your primary care physician please Get Medicines reviewed and adjusted.  If you experience worsening of your admission symptoms, develop shortness of breath, life threatening emergency, suicidal or homicidal thoughts you must seek medical attention immediately by calling 911 or calling your MD immediately  if symptoms less severe.  You Must read complete instructions/literature along with all the possible adverse reactions/side effects for all the Medicines you take and that have been prescribed to you. Take any new Medicines after you have completely understood and accpet all the possible adverse reactions/side effects.   Do not drive, operate heavy machinery, perform activities at heights, swimming or participation in water activities or provide baby sitting services if your were  admitted for syncope or siezures until you have seen by Primary MD or a Neurologist and advised to do so again.  Do not drive when taking Pain medications.   Procedures/Studies: ECHOCARDIOGRAM COMPLETE Result Date: 06/30/2023    ECHOCARDIOGRAM REPORT   Patient Name:   Samuel Choi Date of Exam: 06/30/2023 Medical Rec #:  [id number]      Height:       73.0 in Accession #:    [id number]     Weight:       147.3 lb Date of Birth:  09/08/1965       BSA:          1.889 m Patient Age:    20 years  BP:           162/106 mmHg Patient Gender: M              HR:           97 bpm. Exam Location:  [facility] Procedure: 2D Echo Indications:     CHF I50.31  History:         Patient has prior history of Echocardiogram examinations, most                  recent 02/04/2022.  Sonographer:     Overton Mam RDCS, FASE Referring Phys:  [id number] Emeline General Diagnosing Phys: Windell Norfolk IMPRESSIONS  1. Left ventricular ejection fraction, by estimation, is 65 to 70%. The left ventricle has normal function. The left ventricle has no regional wall motion abnormalities. There is moderate concentric left ventricular hypertrophy. Left ventricular diastolic parameters are indeterminate. There is the interventricular septum is flattened in systole and diastole, consistent with right ventricular pressure and volume overload.  2. Right ventricular systolic function is mildly reduced. The right ventricular size is moderately enlarged. There is severely elevated pulmonary artery systolic pressure. The estimated right ventricular systolic pressure is 79.0 mmHg.  3. Right atrial size was severely dilated.  4. A small pericardial effusion is present. The pericardial effusion is circumferential. There is no evidence of cardiac tamponade.  5. The mitral valve is normal in structure. Trivial mitral valve regurgitation.  6. Tricuspid valve regurgitation is severe.  7. The aortic valve is tricuspid. Aortic valve regurgitation is not visualized.   8. The inferior vena cava is dilated in size with <50% respiratory variability, suggesting right atrial pressure of 15 mmHg. FINDINGS  Left Ventricle: Left ventricular ejection fraction, by estimation, is 65 to 70%. The left ventricle has normal function. The left ventricle has no regional wall motion abnormalities. The left ventricular internal cavity size was small. There is moderate  concentric left ventricular hypertrophy. The interventricular septum is flattened in systole and diastole, consistent with right ventricular pressure and volume overload. Left ventricular diastolic parameters are indeterminate. Right Ventricle: The right ventricular size is moderately enlarged. No increase in right ventricular wall thickness. Right ventricular systolic function is mildly reduced. There is severely elevated pulmonary artery systolic pressure. The tricuspid regurgitant velocity is 4.00 m/s, and with an assumed right atrial pressure of 15 mmHg, the estimated right ventricular systolic pressure is 79.0 mmHg. Left Atrium: Left atrial size was normal in size. Right Atrium: Right atrial size was severely dilated. Pericardium: A small pericardial effusion is present. The pericardial effusion is circumferential. There is no evidence of cardiac tamponade. Mitral Valve: The mitral valve is normal in structure. Trivial mitral valve regurgitation. Tricuspid Valve: The tricuspid valve is normal in structure. Tricuspid valve regurgitation is severe. Aortic Valve: The aortic valve is tricuspid. Aortic valve regurgitation is not visualized. Aortic valve peak gradient measures 6.5 mmHg. Pulmonic Valve: The pulmonic valve was not well visualized. Pulmonic valve regurgitation is trivial. Aorta: The aortic root and ascending aorta are structurally normal, with no evidence of dilitation. Venous: The inferior vena cava is dilated in size with less than 50% respiratory variability, suggesting right atrial pressure of 15 mmHg. IAS/Shunts:  The atrial septum is grossly normal.  LEFT VENTRICLE PLAX 2D LVIDd:         3.40 cm   Diastology LVIDs:         2.40 cm   LV e' medial:    8.86 cm/s LV PW:  1.50 cm   LV E/e' medial:  7.8 LV IVS:        1.40 cm   LV e' lateral:   10.38 cm/s LVOT diam:     1.80 cm   LV E/e' lateral: 6.7 LV SV:         48 LV SV Index:   25 LVOT Area:     2.54 cm  RIGHT VENTRICLE RV Basal diam:  4.30 cm LEFT ATRIUM           Index       RIGHT ATRIUM           Index LA diam:      3.10 cm 1.64 cm/m  RA Area:     27.50 cm LA Vol (A4C): 11.5 ml 6.09 ml/m  RA Volume:   103.00 ml 54.52 ml/m  AORTIC VALVE                 PULMONIC VALVE AV Area (Vmax): 2.50 cm     PV Vmax:       0.99 m/s AV Vmax:        127.00 cm/s  PV Peak grad:  4.0 mmHg AV Peak Grad:   6.5 mmHg LVOT Vmax:      125.00 cm/s LVOT Vmean:     72.600 cm/s LVOT VTI:       0.187 m                              PULMONARY ARTERY AORTA                        MPA diam:        2.50 cm Ao Root diam: 3.80 cm Ao Asc diam:  3.10 cm MITRAL VALVE                TRICUSPID VALVE MV Area (PHT): 6.17 cm     TV Peak grad:   56.2 mmHg MV Decel Time: 123 msec     TV Vmax:        3.75 m/s MV E velocity: 69.45 cm/s   TR Peak grad:   64.0 mmHg MV A velocity: 103.85 cm/s  TR Vmax:        400.00 cm/s MV E/A ratio:  0.67                             SHUNTS                             Systemic VTI:  0.19 m                             Systemic Diam: 1.80 cm Windell Norfolk Electronically signed by Windell Norfolk Signature Date/Time: 06/30/2023/1:32:09 PM    Final    DG Chest 2 View Result Date: 06/29/2023 CLINICAL DATA:  Shortness of breath for 2 weeks. EXAM: CHEST - 2 VIEW COMPARISON:  X-ray and CT angiogram 06/14/2023. FINDINGS: Stable left subclavian chest port with the tip along the central SVC. Persistent masslike thickening and distortion right perihilar as well as a small right pleural effusion. Left lung is grossly clear. Chronic lung changes. No pneumothorax. Enlarged  cardiopericardial silhouette. Overlapping cardiac leads. Film is under penetrated. IMPRESSION: No significant interval change when  adjusting for technique. Electronically Signed   By: Karen Kays M.D.   On: 06/29/2023 13:17   US ABDOMEN LIMITED RUQ (LIVER/GB) Result Date: 06/29/2023 CLINICAL DATA:  Swelling EXAM: ULTRASOUND ABDOMEN LIMITED RIGHT UPPER QUADRANT COMPARISON:  CT 02/23/2023. FINDINGS: Gallbladder: Distended gallbladder with significant wall thickening up to 10 mm. Wall edema identified. No obvious stones. No reported sonographic Murphy's sign. Common bile duct: Diameter: 2 mm Liver: No focal lesion identified. Within normal limits in parenchymal echogenicity. Portal vein is patent on color Doppler imaging with normal direction of blood flow towards the liver. Other: None. IMPRESSION: Dilated gallbladder without stones however there is severe gallbladder wall thickening of the 10 mm and some wall edema. This is of uncertain etiology. Further workup as clinically appropriate. No ductal dilatation. Electronically Signed   By: Karen Kays M.D.   On: 06/29/2023 13:15   CT Angio Chest Pulmonary Embolism (PE) W or WO Contrast Result Date: 06/14/2023 CLINICAL DATA:  History of DVTs with shortness of breath, congestion, and wheezing EXAM: CT ANGIOGRAPHY CHEST WITH CONTRAST TECHNIQUE: Multidetector CT imaging of the chest was performed using the standard protocol during bolus administration of intravenous contrast. Multiplanar CT image reconstructions and MIPs were obtained to evaluate the vascular anatomy. RADIATION DOSE REDUCTION: This exam was performed according to the departmental dose-optimization program which includes automated exposure control, adjustment of the mA and/or kV according to patient size and/or use of iterative reconstruction technique. CONTRAST:  75mL OMNIPAQUE IOHEXOL 350 MG/ML SOLN COMPARISON:  CTA chest dated 02/23/2023, same day chest radiograph FINDINGS: Cardiovascular: Left  chest wall port terminates at the lower SVC. Irregular enhancement at the tip of the catheter (4:70). The study is high quality for the evaluation of pulmonary embolism. There are no filling defects in the central, lobar, segmental or subsegmental pulmonary artery branches to suggest acute pulmonary embolism. Main pulmonary artery measures 3.4 cm. Normal heart size. No significant pericardial fluid/thickening. Reflux of contrast material into the hepatic veins, suggesting a degree of right heart dysfunction. Coronary artery calcifications and aortic atherosclerosis. Mediastinum/Nodes: Similar rightward deviation of the mediastinum. Imaged thyroid gland without nodules meeting criteria for imaging follow-up by size. Mildly patulous esophagus. Increased size of 11 mm prevascular lymph node (4:56), previously 8 mm. Lungs/Pleura: The central airways are patent. Status post right upper lobectomy. Changes of centrilobular emphysema. Similar appearance of band-like consolidation involving the medial right middle and lower lobes with volume loss and traction bronchiectasis and bronchiolectasis in keeping with radiation changes. There is increased thickening along the major fissure. New irregular consolidation in the right middle lobe and new mural nodule along the posterior right lower lobe cystic bronchiectasis measuring 1.5 x 0.9 cm (5:85). Increased conspicuity of previously noted nodular scarring adjacent to the cystic bronchiectasis measuring 1.2 x 0.5 cm (5:83). Increased left lung mosaic attenuation. Diffuse interlobular septal thickening. Persistent ill-defined micronodularity diffusely. No pneumothorax. Similar small right pleural effusion and thickening. Upper abdomen: Normal. Musculoskeletal: No acute or abnormal lytic or blastic osseous lesions. Review of the MIP images confirms the above findings. IMPRESSION: 1. No evidence of pulmonary embolism. 2. New irregular consolidation in the right middle lobe and new  mural nodule along the posterior right lower lobe cystic bronchiectasis measuring 1.5 x 0.9 cm. Increased conspicuity of previously noted nodular scarring adjacent to the cystic bronchiectasis measuring 1.2 x 0.5 cm. Findings are favored to represent infection/inflammation. 3. Increased size of 11 mm prevascular lymph node, likely reactive. 4. Increased left lung mosaic attenuation and interlobular septal  thickening, likely pulmonary edema in the setting of right heart dysfunction and pulmonary arterial hypertension. Increased small pericardial effusion. 5. Persistent ill-defined micronodularity diffusely, which may be seen in the setting of respiratory bronchiolitis. 6. Irregular enhancement at the tip of the left chest wall port catheter, which may be related to contrast bolus timing. Recommend correlation with port function. 7. Aortic Atherosclerosis (ICD10-I70.0) and Emphysema (ICD10-J43.9). Electronically Signed   By: Agustin Cree M.D.   On: 06/14/2023 12:03   US Venous Img Lower Unilateral Left Result Date: 06/14/2023 CLINICAL DATA:  Left leg swelling EXAM: Left LOWER EXTREMITY VENOUS DOPPLER ULTRASOUND TECHNIQUE: Gray-scale sonography with compression, as well as color and duplex ultrasound, were performed to evaluate the deep venous system(s) from the level of the common femoral vein through the popliteal and proximal calf veins. COMPARISON:  None Available. FINDINGS: VENOUS Normal compressibility of the common femoral, superficial femoral, and popliteal veins, as well as the visualized calf veins. Visualized portions of profunda femoral vein and great saphenous vein unremarkable. No filling defects to suggest DVT on grayscale or color Doppler imaging. Doppler waveforms show normal direction of venous flow, normal respiratory plasticity and response to augmentation. Limited views of the contralateral common femoral vein are unremarkable. OTHER None. Limitations: none IMPRESSION: No evidence of left lower  extremity DVT. Electronically Signed   By: Karen Kays M.D.   On: 06/14/2023 10:42   DG Chest 2 View Result Date: 06/14/2023 CLINICAL DATA:  One-week history of cough, congestion, and shortness of breath EXAM: CHEST - 2 VIEW COMPARISON:  Chest radiograph dated 02/10/2022 FINDINGS: Lines/tubes: Left chest wall port tip projects over the superior cavoatrial junction. Lungs: Similar appearance of right perihilar radiation fibrosis. Mild diffuse patchy and interstitial left lung opacities. Pleura: Blunting of the right costophrenic angle.  No pneumothorax. Heart/mediastinum: Similar  cardiomediastinal silhouette. Bones: No acute osseous abnormality. IMPRESSION: 1. Mild diffuse patchy and interstitial left lung opacities, which may represent atypical infection or pulmonary edema. 2. Similar appearance of right perihilar radiation fibrosis. 3. Blunting of the right costophrenic angle, which may represent a small pleural effusion or scarring. Electronically Signed   By: Agustin Cree M.D.   On: 06/14/2023 10:05     The results of significant diagnostics from this hospitalization (including imaging, microbiology, ancillary and laboratory) are listed below for reference.     Microbiology: Recent Results (from the past 240 hours)  Resp panel by RT-PCR (RSV, Flu A&B, Covid) Anterior Nasal Swab     Status: None   Collection Time: 06/29/23 11:19 AM   Specimen: Anterior Nasal Swab  Result Value Ref Range Status   SARS Coronavirus 2 by RT PCR NEGATIVE NEGATIVE Final    Comment: (NOTE) SARS-CoV-2 target nucleic acids are NOT DETECTED.  The SARS-CoV-2 RNA is generally detectable in upper respiratory specimens during the acute phase of infection. The lowest concentration of SARS-CoV-2 viral copies this assay can detect is 138 copies/mL. A negative result does not preclude SARS-Cov-2 infection and should not be used as the sole basis for treatment or other patient management decisions. A negative result may occur  with  improper specimen collection/handling, submission of specimen other than nasopharyngeal swab, presence of viral mutation(s) within the areas targeted by this assay, and inadequate number of viral copies(<138 copies/mL). A negative result must be combined with clinical observations, patient history, and epidemiological information. The expected result is Negative.  Fact Sheet for Patients:  BloggerCourse.com  Fact Sheet for Healthcare Providers:  SeriousBroker.it  This test is  no t yet approved or cleared by the [place] and  has been authorized for detection and/or diagnosis of SARS-CoV-2 by FDA under an Emergency Use Authorization (EUA). This EUA will remain  in effect (meaning this test can be used) for the duration of the COVID-19 declaration under Section 564(b)(1) of the Act, 21 U.S.C.section 360bbb-3(b)(1), unless the authorization is terminated  or revoked sooner.       Influenza A by PCR NEGATIVE NEGATIVE Final   Influenza B by PCR NEGATIVE NEGATIVE Final    Comment: (NOTE) The Xpert Xpress SARS-CoV-2/FLU/RSV plus assay is intended as an aid in the diagnosis of influenza from Nasopharyngeal swab specimens and should not be used as a sole basis for treatment. Nasal washings and aspirates are unacceptable for Xpert Xpress SARS-CoV-2/FLU/RSV testing.  Fact Sheet for Patients: BloggerCourse.com  Fact Sheet for Healthcare Providers: SeriousBroker.it  This test is not yet approved or cleared by the [place] FDA and has been authorized for detection and/or diagnosis of SARS-CoV-2 by FDA under an Emergency Use Authorization (EUA). This EUA will remain in effect (meaning this test can be used) for the duration of the COVID-19 declaration under Section 564(b)(1) of the Act, 21 U.S.C. section 360bbb-3(b)(1), unless the authorization is terminated  or revoked.     Resp Syncytial Virus by PCR NEGATIVE NEGATIVE Final    Comment: (NOTE) Fact Sheet for Patients: BloggerCourse.com  Fact Sheet for Healthcare Providers: SeriousBroker.it  This test is not yet approved or cleared by the [place] FDA and has been authorized for detection and/or diagnosis of SARS-CoV-2 by FDA under an Emergency Use Authorization (EUA). This EUA will remain in effect (meaning this test can be used) for the duration of the COVID-19 declaration under Section 564(b)(1) of the Act, 21 U.S.C. section 360bbb-3(b)(1), unless the authorization is terminated or revoked.  Performed at [facility], [address] [place], [place] [address]      Labs: BNP (last 3 results) Recent Labs    06/14/23 0847 06/29/23 1119  BNP 613.8* 897.0*   Basic Metabolic Panel: Recent Labs  Lab 06/29/23 1119 06/30/23 0319 07/01/23 0445  NA 142 137 134*  K 3.0* 3.7 3.1*  CL 104 98 90*  CO2 20* 27 32  GLUCOSE 55* 132* 95  BUN 18 25* 27*  CREATININE 0.84 1.10 0.92  CALCIUM 8.4* 8.5* 8.8*  MG 1.6*  --  1.6*  PHOS  --   --  2.6   Liver Function Tests: Recent Labs  Lab 06/29/23 1119  AST 27  ALT 65*  ALKPHOS 139*  BILITOT 1.4*  PROT 6.8  ALBUMIN 3.2*   Recent Labs  Lab 06/29/23 1119  LIPASE 25   No results for input(s): "AMMONIA" in the last 168 hours. CBC: Recent Labs  Lab 06/29/23 1119 07/01/23 0445  WBC 14.5* 18.9*  HGB 13.0 12.7*  HCT 38.8* 36.8*  MCV 104.9* 100.3*  PLT 138* 122*   Cardiac Enzymes: No results for input(s): "CKTOTAL", "CKMB", "CKMBINDEX", "TROPONINI" in the last 168 hours. BNP: Invalid input(s): "POCBNP" CBG: Recent Labs  Lab 06/29/23 1551  GLUCAP 74   D-Dimer No results for input(s): "DDIMER" in the last 72 hours. Hgb A1c No results for input(s): "HGBA1C" in the last 72 hours. Lipid Profile No results for input(s): "CHOL", "HDL", "LDLCALC", "TRIG",  "CHOLHDL", "LDLDIRECT" in the last 72 hours. Thyroid function studies No results for input(s): "TSH", "T4TOTAL", "T3FREE", "THYROIDAB" in the last 72 hours.  Invalid input(s): "FREET3" Anemia work  up No results for input(s): "VITAMINB12", "FOLATE", "FERRITIN", "TIBC", "IRON", "RETICCTPCT" in the last 72 hours. Urinalysis    Component Value Date/Time   COLORURINE Yellow 11/02/2014 1900   APPEARANCEUR Hazy 11/02/2014 1900   LABSPEC 1.013 11/02/2014 1900   PHURINE 9.0 11/02/2014 1900   GLUCOSEU Negative 11/02/2014 1900   HGBUR 1+ 11/02/2014 1900   BILIRUBINUR Negative 11/02/2014 1900   KETONESUR Negative 11/02/2014 1900   PROTEINUR 30 mg/dL 40/98/1191 4782   NITRITE Negative 11/02/2014 1900   LEUKOCYTESUR 3+ 11/02/2014 1900   Sepsis Labs Recent Labs  Lab 06/29/23 1119 07/01/23 0445  WBC 14.5* 18.9*   Microbiology Recent Results (from the past 240 hours)  Resp panel by RT-PCR (RSV, Flu A&B, Covid) Anterior Nasal Swab     Status: None   Collection Time: 06/29/23 11:19 AM   Specimen: Anterior Nasal Swab  Result Value Ref Range Status   SARS Coronavirus 2 by RT PCR NEGATIVE NEGATIVE Final    Comment: (NOTE) SARS-CoV-2 target nucleic acids are NOT DETECTED.  The SARS-CoV-2 RNA is generally detectable in upper respiratory specimens during the acute phase of infection. The lowest concentration of SARS-CoV-2 viral copies this assay can detect is 138 copies/mL. A negative result does not preclude SARS-Cov-2 infection and should not be used as the sole basis for treatment or other patient management decisions. A negative result may occur with  improper specimen collection/handling, submission of specimen other than nasopharyngeal swab, presence of viral mutation(s) within the areas targeted by this assay, and inadequate number of viral copies(<138 copies/mL). A negative result must be combined with clinical observations, patient history, and epidemiological information. The  expected result is Negative.  Fact Sheet for Patients:  BloggerCourse.com  Fact Sheet for Healthcare Providers:  SeriousBroker.it  This test is no t yet approved or cleared by the [place] FDA and  has been authorized for detection and/or diagnosis of SARS-CoV-2 by FDA under an Emergency Use Authorization (EUA). This EUA will remain  in effect (meaning this test can be used) for the duration of the COVID-19 declaration under Section 564(b)(1) of the Act, 21 U.S.C.section 360bbb-3(b)(1), unless the authorization is terminated  or revoked sooner.       Influenza A by PCR NEGATIVE NEGATIVE Final   Influenza B by PCR NEGATIVE NEGATIVE Final    Comment: (NOTE) The Xpert Xpress SARS-CoV-2/FLU/RSV plus assay is intended as an aid in the diagnosis of influenza from Nasopharyngeal swab specimens and should not be used as a sole basis for treatment. Nasal washings and aspirates are unacceptable for Xpert Xpress SARS-CoV-2/FLU/RSV testing.  Fact Sheet for Patients: BloggerCourse.com  Fact Sheet for Healthcare Providers: SeriousBroker.it  This test is not yet approved or cleared by the [place] FDA and has been authorized for detection and/or diagnosis of SARS-CoV-2 by FDA under an Emergency Use Authorization (EUA). This EUA will remain in effect (meaning this test can be used) for the duration of the COVID-19 declaration under Section 564(b)(1) of the Act, 21 U.S.C. section 360bbb-3(b)(1), unless the authorization is terminated or revoked.     Resp Syncytial Virus by PCR NEGATIVE NEGATIVE Final    Comment: (NOTE) Fact Sheet for Patients: BloggerCourse.com  Fact Sheet for Healthcare Providers: SeriousBroker.it  This test is not yet approved or cleared by the [place] FDA and has been authorized for detection and/or  diagnosis of SARS-CoV-2 by FDA under an Emergency Use Authorization (EUA). This EUA will remain in effect (meaning this test can be used) for the duration  of the COVID-19 declaration under Section 564(b)(1) of the Act, 21 U.S.C. section 360bbb-3(b)(1), unless the authorization is terminated or revoked.  Performed at [facility], [address] [place], [place] [address]      Time coordinating discharge:  I have spent 35 minutes face to face with the patient and on the ward discussing the patients care, assessment, plan and disposition with other care givers. >50% of the time was devoted counseling the patient about the risks and benefits of treatment/Discharge disposition and coordinating care.   SIGNED:   Miguel Rota, MD  [facility] 07/01/2023, 11:35 AM   If 7PM-7AM, please contact night-coverage

## 2023-07-01 NOTE — Progress Notes (Signed)
PT Cancellation Note  Patient Details Name: Samuel Choi MRN: [id number] DOB: [birth year]   Cancelled Treatment:    Reason Eval/Treat Not Completed: PT screened, no needs identified, will sign off. Orders received and chart reviewed. PT independent with STS and gait completing ~240' no AD at a velocity of 2.5 feet/second indicative of lowered falls risk with household and community ambulation. Pt does have elevation of HR to 140's and desats to ~88% on 3 L/min but this improves to baseline with rest > 90% and low 100's HR. Education provided on energy conservation and PLB. Besides elevated O2 need, pt is at baseline level of functioning and does not require acute PT needs. PT to discharge in house.   Delphia Grates. Fairly IV, PT, DPT Physical Therapist- [place]  [facility]  07/01/2023, 10:00 AM

## 2023-07-02 ENCOUNTER — Inpatient Hospital Stay: Payer: Medicare Other | Attending: Oncology

## 2023-07-02 DIAGNOSIS — C3491 Malignant neoplasm of unspecified part of right bronchus or lung: Secondary | ICD-10-CM | POA: Diagnosis present

## 2023-07-02 DIAGNOSIS — Z95828 Presence of other vascular implants and grafts: Secondary | ICD-10-CM

## 2023-07-02 DIAGNOSIS — Z452 Encounter for adjustment and management of vascular access device: Secondary | ICD-10-CM | POA: Insufficient documentation

## 2023-07-02 MED ORDER — HEPARIN SOD (PORK) LOCK FLUSH 100 UNIT/ML IV SOLN
500.0000 [IU] | Freq: Once | INTRAVENOUS | Status: AC
  Administered 2023-07-02: 500 [IU] via INTRAVENOUS
  Filled 2023-07-02: qty 5

## 2023-07-02 MED ORDER — SODIUM CHLORIDE 0.9% FLUSH
10.0000 mL | Freq: Once | INTRAVENOUS | Status: AC
Start: 2023-07-02 — End: 2023-07-02
  Administered 2023-07-02: 10 mL via INTRAVENOUS
  Filled 2023-07-02: qty 10

## 2023-07-05 ENCOUNTER — Other Ambulatory Visit: Payer: Self-pay

## 2023-07-05 ENCOUNTER — Emergency Department: Payer: Medicare Other

## 2023-07-05 ENCOUNTER — Inpatient Hospital Stay
Admission: EM | Admit: 2023-07-05 | Discharge: 2023-07-09 | DRG: 871 | Disposition: A | Discharge status: Home / Self Care | Payer: Medicare Other | Attending: Internal Medicine | Admitting: Internal Medicine

## 2023-07-05 ENCOUNTER — Encounter: Payer: Self-pay | Admitting: *Deleted

## 2023-07-05 DIAGNOSIS — J441 Chronic obstructive pulmonary disease with (acute) exacerbation: Principal | ICD-10-CM | POA: Diagnosis present

## 2023-07-05 DIAGNOSIS — Z86711 Personal history of pulmonary embolism: Secondary | ICD-10-CM | POA: Diagnosis not present

## 2023-07-05 DIAGNOSIS — I071 Rheumatic tricuspid insufficiency: Secondary | ICD-10-CM

## 2023-07-05 DIAGNOSIS — Z79899 Other long term (current) drug therapy: Secondary | ICD-10-CM | POA: Diagnosis not present

## 2023-07-05 DIAGNOSIS — I272 Pulmonary hypertension, unspecified: Secondary | ICD-10-CM

## 2023-07-05 DIAGNOSIS — Z7951 Long term (current) use of inhaled steroids: Secondary | ICD-10-CM

## 2023-07-05 DIAGNOSIS — B338 Other specified viral diseases: Secondary | ICD-10-CM

## 2023-07-05 DIAGNOSIS — J9621 Acute and chronic respiratory failure with hypoxia: Secondary | ICD-10-CM | POA: Diagnosis present

## 2023-07-05 DIAGNOSIS — Z7901 Long term (current) use of anticoagulants: Secondary | ICD-10-CM | POA: Diagnosis not present

## 2023-07-05 DIAGNOSIS — Z87891 Personal history of nicotine dependence: Secondary | ICD-10-CM

## 2023-07-05 DIAGNOSIS — J121 Respiratory syncytial virus pneumonia: Secondary | ICD-10-CM

## 2023-07-05 DIAGNOSIS — I081 Rheumatic disorders of both mitral and tricuspid valves: Secondary | ICD-10-CM | POA: Diagnosis present

## 2023-07-05 DIAGNOSIS — E876 Hypokalemia: Secondary | ICD-10-CM | POA: Diagnosis present

## 2023-07-05 DIAGNOSIS — F1010 Alcohol abuse, uncomplicated: Secondary | ICD-10-CM | POA: Diagnosis present

## 2023-07-05 DIAGNOSIS — E43 Unspecified severe protein-calorie malnutrition: Secondary | ICD-10-CM | POA: Insufficient documentation

## 2023-07-05 DIAGNOSIS — Z9981 Dependence on supplemental oxygen: Secondary | ICD-10-CM | POA: Diagnosis not present

## 2023-07-05 DIAGNOSIS — R54 Age-related physical debility: Secondary | ICD-10-CM | POA: Diagnosis present

## 2023-07-05 DIAGNOSIS — E46 Unspecified protein-calorie malnutrition: Secondary | ICD-10-CM | POA: Diagnosis present

## 2023-07-05 DIAGNOSIS — Z801 Family history of malignant neoplasm of trachea, bronchus and lung: Secondary | ICD-10-CM

## 2023-07-05 DIAGNOSIS — J440 Chronic obstructive pulmonary disease with acute lower respiratory infection: Secondary | ICD-10-CM | POA: Diagnosis present

## 2023-07-05 DIAGNOSIS — Z86718 Personal history of other venous thrombosis and embolism: Secondary | ICD-10-CM

## 2023-07-05 DIAGNOSIS — J439 Emphysema, unspecified: Secondary | ICD-10-CM | POA: Diagnosis present

## 2023-07-05 DIAGNOSIS — I5033 Acute on chronic diastolic (congestive) heart failure: Secondary | ICD-10-CM | POA: Diagnosis present

## 2023-07-05 DIAGNOSIS — Z87442 Personal history of urinary calculi: Secondary | ICD-10-CM

## 2023-07-05 DIAGNOSIS — C3491 Malignant neoplasm of unspecified part of right bronchus or lung: Secondary | ICD-10-CM | POA: Diagnosis present

## 2023-07-05 DIAGNOSIS — Z66 Do not resuscitate: Secondary | ICD-10-CM | POA: Diagnosis present

## 2023-07-05 DIAGNOSIS — I509 Heart failure, unspecified: Secondary | ICD-10-CM

## 2023-07-05 DIAGNOSIS — I110 Hypertensive heart disease with heart failure: Secondary | ICD-10-CM | POA: Diagnosis present

## 2023-07-05 DIAGNOSIS — Z681 Body mass index (BMI) 19 or less, adult: Secondary | ICD-10-CM

## 2023-07-05 DIAGNOSIS — E872 Acidosis, unspecified: Secondary | ICD-10-CM | POA: Diagnosis present

## 2023-07-05 DIAGNOSIS — F1090 Alcohol use, unspecified, uncomplicated: Secondary | ICD-10-CM | POA: Insufficient documentation

## 2023-07-05 DIAGNOSIS — A419 Sepsis, unspecified organism: Principal | ICD-10-CM | POA: Diagnosis present

## 2023-07-05 DIAGNOSIS — I10 Essential (primary) hypertension: Secondary | ICD-10-CM | POA: Diagnosis present

## 2023-07-05 DIAGNOSIS — Z8249 Family history of ischemic heart disease and other diseases of the circulatory system: Secondary | ICD-10-CM

## 2023-07-05 DIAGNOSIS — Z833 Family history of diabetes mellitus: Secondary | ICD-10-CM

## 2023-07-05 DIAGNOSIS — Z803 Family history of malignant neoplasm of breast: Secondary | ICD-10-CM

## 2023-07-05 DIAGNOSIS — J189 Pneumonia, unspecified organism: Secondary | ICD-10-CM

## 2023-07-05 HISTORY — DX: Other specified viral diseases: B33.8

## 2023-07-05 HISTORY — DX: Respiratory syncytial virus pneumonia: J12.1

## 2023-07-05 LAB — BASIC METABOLIC PANEL
Anion gap: 19 — ABNORMAL HIGH (ref 5–15)
BUN: 28 mg/dL — ABNORMAL HIGH (ref 6–20)
CO2: 30 mmol/L (ref 22–32)
Calcium: 9.8 mg/dL (ref 8.9–10.3)
Chloride: 94 mmol/L — ABNORMAL LOW (ref 98–111)
Creatinine, Ser: 1.21 mg/dL (ref 0.61–1.24)
GFR, Estimated: >60 mL/min (ref >60)
Glucose, Bld: 108 mg/dL — ABNORMAL HIGH (ref 70–99)
Potassium: 3.3 mmol/L — ABNORMAL LOW (ref 3.5–5.1)
Sodium: 143 mmol/L (ref 135–145)

## 2023-07-05 LAB — HEPATIC FUNCTION PANEL
ALT: 32 U/L (ref 0–44)
AST: 22 U/L (ref 15–41)
Albumin: 4.2 g/dL (ref 3.5–5.0)
Alkaline Phosphatase: 138 U/L — ABNORMAL HIGH (ref 38–126)
Bilirubin, Direct: 0.3 mg/dL — ABNORMAL HIGH (ref 0.0–0.2)
Indirect Bilirubin: 1.1 mg/dL — ABNORMAL HIGH (ref 0.3–0.9)
Total Bilirubin: 1.4 mg/dL — ABNORMAL HIGH (ref <1.2)
Total Protein: 8 g/dL (ref 6.5–8.1)

## 2023-07-05 LAB — BLOOD GAS, VENOUS
Acid-Base Excess: 14.4 mmol/L — ABNORMAL HIGH (ref 0.0–2.0)
Bicarbonate: 38.5 mmol/L — ABNORMAL HIGH (ref 20.0–28.0)
O2 Saturation: 77.6 %
Patient temperature: 37
pCO2, Ven: 44 mm[Hg] (ref 44–60)
pH, Ven: 7.55 — ABNORMAL HIGH (ref 7.25–7.43)
pO2, Ven: 45 mm[Hg] (ref 32–45)

## 2023-07-05 LAB — CBC
HCT: 43.7 % (ref 39.0–52.0)
Hemoglobin: 14.9 g/dL (ref 13.0–17.0)
MCH: 34.9 pg — ABNORMAL HIGH (ref 26.0–34.0)
MCHC: 34.1 g/dL (ref 30.0–36.0)
MCV: 102.3 fL — ABNORMAL HIGH (ref 80.0–100.0)
Platelets: 158 10*3/uL (ref 150–400)
RBC: 4.27 MIL/uL (ref 4.22–5.81)
RDW: 14.1 % (ref 11.5–15.5)
WBC: 19.1 10*3/uL — ABNORMAL HIGH (ref 4.0–10.5)
nRBC: 0.2 % (ref 0.0–0.2)

## 2023-07-05 LAB — PROTIME-INR
INR: 1.2 (ref 0.8–1.2)
Prothrombin Time: 15.7 s — ABNORMAL HIGH (ref 11.4–15.2)

## 2023-07-05 LAB — TROPONIN I (HIGH SENSITIVITY)
Troponin I (High Sensitivity): 69 ng/L — ABNORMAL HIGH (ref <18)
Troponin I (High Sensitivity): 74 ng/L — ABNORMAL HIGH (ref <18)

## 2023-07-05 LAB — RESP PANEL BY RT-PCR (RSV, FLU A&B, COVID)  RVPGX2
Influenza A by PCR: NEGATIVE
Influenza B by PCR: NEGATIVE
Resp Syncytial Virus by PCR: POSITIVE — AB
SARS Coronavirus 2 by RT PCR: NEGATIVE

## 2023-07-05 LAB — LACTIC ACID, PLASMA
Lactic Acid, Venous: 2 mmol/L (ref 0.5–1.9)
Lactic Acid, Venous: 1.2 mmol/L (ref 0.5–1.9)

## 2023-07-05 LAB — BRAIN NATRIURETIC PEPTIDE: B Natriuretic Peptide: 1021.5 pg/mL — ABNORMAL HIGH (ref 0.0–100.0)

## 2023-07-05 MED ORDER — IPRATROPIUM-ALBUTEROL 0.5-2.5 (3) MG/3ML IN SOLN
3.0000 mL | Freq: Four times a day (QID) | RESPIRATORY_TRACT | Status: DC
  Administered 2023-07-06 – 2023-07-09 (×14): 3 mL via RESPIRATORY_TRACT
  Filled 2023-07-05 (×14): qty 3

## 2023-07-05 MED ORDER — DILTIAZEM HCL ER COATED BEADS 120 MG PO CP24
120.0000 mg | ORAL_CAPSULE | Freq: Every day | ORAL | Status: DC
  Administered 2023-07-06 – 2023-07-09 (×4): 120 mg via ORAL
  Filled 2023-07-05 (×4): qty 1

## 2023-07-05 MED ORDER — APIXABAN 2.5 MG PO TABS
2.5000 mg | ORAL_TABLET | Freq: Two times a day (BID) | ORAL | Status: DC
  Administered 2023-07-06 – 2023-07-09 (×7): 2.5 mg via ORAL
  Filled 2023-07-05 (×7): qty 1

## 2023-07-05 MED ORDER — ONDANSETRON HCL 4 MG/2ML IJ SOLN: 4.0000 mg | Freq: Four times a day (QID) | INTRAMUSCULAR | Status: DC | PRN

## 2023-07-05 MED ORDER — ONDANSETRON HCL 4 MG PO TABS: 4.0000 mg | ORAL_TABLET | Freq: Four times a day (QID) | ORAL | Status: DC | PRN

## 2023-07-05 MED ORDER — ADULT MULTIVITAMIN W/MINERALS CH
1.0000 | ORAL_TABLET | Freq: Every day | ORAL | Status: DC
  Administered 2023-07-06 – 2023-07-09 (×4): 1 via ORAL
  Filled 2023-07-05 (×4): qty 1

## 2023-07-05 MED ORDER — PREDNISONE 20 MG PO TABS
40.0000 mg | ORAL_TABLET | Freq: Every day | ORAL | Status: DC
  Administered 2023-07-07 – 2023-07-09 (×3): 40 mg via ORAL
  Filled 2023-07-05 (×3): qty 2

## 2023-07-05 MED ORDER — SODIUM CHLORIDE 0.9 % IV SOLN
1500.0000 mg | Freq: Once | INTRAVENOUS | Status: AC
  Administered 2023-07-06: 1500 mg via INTRAVENOUS
  Filled 2023-07-05: qty 30

## 2023-07-05 MED ORDER — SODIUM CHLORIDE 0.9 % IV BOLUS
500.0000 mL | Freq: Once | INTRAVENOUS | Status: AC
  Administered 2023-07-06: 500 mL via INTRAVENOUS

## 2023-07-05 MED ORDER — METOPROLOL SUCCINATE ER 25 MG PO TB24
25.0000 mg | ORAL_TABLET | Freq: Every day | ORAL | Status: DC
  Administered 2023-07-06 – 2023-07-09 (×4): 25 mg via ORAL
  Filled 2023-07-05 (×4): qty 1

## 2023-07-05 MED ORDER — ALBUTEROL SULFATE (2.5 MG/3ML) 0.083% IN NEBU: 2.5000 mg | INHALATION_SOLUTION | RESPIRATORY_TRACT | Status: DC | PRN

## 2023-07-05 MED ORDER — ACETAMINOPHEN 650 MG RE SUPP: 650.0000 mg | Freq: Four times a day (QID) | RECTAL | Status: DC | PRN

## 2023-07-05 MED ORDER — IPRATROPIUM-ALBUTEROL 0.5-2.5 (3) MG/3ML IN SOLN
6.0000 mL | Freq: Once | RESPIRATORY_TRACT | Status: AC
  Administered 2023-07-05: 6 mL via RESPIRATORY_TRACT
  Filled 2023-07-05: qty 6

## 2023-07-05 MED ORDER — SODIUM CHLORIDE 0.9 % IV SOLN
2.0000 g | Freq: Three times a day (TID) | INTRAVENOUS | Status: DC
  Administered 2023-07-06: 2 g via INTRAVENOUS
  Filled 2023-07-05: qty 12.5

## 2023-07-05 MED ORDER — VANCOMYCIN HCL 1500 MG/300ML IV SOLN
1500.0000 mg | Freq: Once | INTRAVENOUS | Status: DC
  Filled 2023-07-05: qty 300

## 2023-07-05 MED ORDER — FUROSEMIDE 10 MG/ML IJ SOLN
40.0000 mg | Freq: Once | INTRAMUSCULAR | Status: AC
  Administered 2023-07-06: 40 mg via INTRAVENOUS
  Filled 2023-07-05: qty 4

## 2023-07-05 MED ORDER — SODIUM CHLORIDE 0.9 % IV SOLN
2.0000 g | Freq: Once | INTRAVENOUS | Status: AC
  Administered 2023-07-05: 2 g via INTRAVENOUS
  Filled 2023-07-05: qty 12.5

## 2023-07-05 MED ORDER — ACETAMINOPHEN 325 MG PO TABS
650.0000 mg | ORAL_TABLET | Freq: Four times a day (QID) | ORAL | Status: DC | PRN
  Administered 2023-07-07: 650 mg via ORAL
  Filled 2023-07-05: qty 2

## 2023-07-05 MED ORDER — POTASSIUM CHLORIDE CRYS ER 20 MEQ PO TBCR
20.0000 meq | EXTENDED_RELEASE_TABLET | Freq: Every day | ORAL | Status: DC
  Administered 2023-07-06 – 2023-07-09 (×4): 20 meq via ORAL
  Filled 2023-07-05 (×4): qty 1

## 2023-07-05 MED ORDER — SODIUM CHLORIDE 0.9 % IV SOLN
500.0000 mg | INTRAVENOUS | Status: DC
  Administered 2023-07-05 – 2023-07-08 (×4): 500 mg via INTRAVENOUS
  Filled 2023-07-05 (×4): qty 5

## 2023-07-05 MED ORDER — MAGNESIUM CHLORIDE 64 MG PO TBEC
1.0000 | DELAYED_RELEASE_TABLET | Freq: Every day | ORAL | Status: DC
  Administered 2023-07-06 – 2023-07-09 (×4): 64 mg via ORAL
  Filled 2023-07-05 (×4): qty 1

## 2023-07-05 MED ORDER — METHYLPREDNISOLONE SODIUM SUCC 40 MG IJ SOLR
40.0000 mg | Freq: Two times a day (BID) | INTRAMUSCULAR | Status: AC
  Administered 2023-07-06 (×2): 40 mg via INTRAVENOUS
  Filled 2023-07-05 (×2): qty 1

## 2023-07-05 MED ORDER — ENSURE ENLIVE PO LIQD
237.0000 mL | Freq: Three times a day (TID) | ORAL | Status: DC
  Administered 2023-07-06 – 2023-07-09 (×8): 237 mL via ORAL

## 2023-07-05 MED ORDER — METHYLPREDNISOLONE SODIUM SUCC 125 MG IJ SOLR
125.0000 mg | Freq: Once | INTRAMUSCULAR | Status: AC
  Administered 2023-07-05: 125 mg via INTRAVENOUS
  Filled 2023-07-05: qty 2

## 2023-07-05 NOTE — Progress Notes (Signed)
Elink monitoring for the code sepsis protocol.  

## 2023-07-05 NOTE — Assessment & Plan Note (Signed)
-   Monitor for withdrawal.

## 2023-07-05 NOTE — ED Triage Notes (Addendum)
Pt to triage via wheelchair.  Pt has sob.  Pt reports sx for 2 weeks.  Worse today-hx lung cancer. Oxygen sats 70's in riage.  Pt on  liters [place] prn at home.  Pt placed on  4 liters oxygen in triage.  Sats now 88%.  Pt alert.

## 2023-07-05 NOTE — Assessment & Plan Note (Signed)
Possible sepsis Suspect lactic acidosis secondary to acute respiratory distress though sepsis a possibility Sepsis criteria will include temp of 99.5, tachycardia and tachypnea with leukocytosis and lactic acidosis Will get procalcitonin Will give cautious 500 mL NS bolus x 2 and cautiously monitor for any worsening of CHF

## 2023-07-05 NOTE — Assessment & Plan Note (Signed)
Chronic anticoagulation Continue apixaban

## 2023-07-05 NOTE — Assessment & Plan Note (Signed)
Resolved with repletion 

## 2023-07-05 NOTE — Assessment & Plan Note (Signed)
Cautiously continue metoprolol, diltiazem, clonidine

## 2023-07-05 NOTE — Assessment & Plan Note (Addendum)
Continue cefepime and vanc Antitussives MRSA screen Incentive spirometer Droplet precautions

## 2023-07-05 NOTE — ED Provider Notes (Signed)
[facility] Provider Note    Event Date/Time   First MD Initiated Contact with Patient 07/05/23 2103     (approximate)   History   Shortness of Breath   HPI  OVED PALMBERG is a 57 year old male with history of COPD on 2 L home O2, pulmonary hypertension, lung cancer in remission, DVT/PE on Eliquis, CHF, HTN presenting to the emergency department for evaluation of shortness of breath.  Patient has had multiple recent admissions for shortness of breath.  Reports worsening shortness of breath over the past few days worse today.  In triage found to have O2 sat of 70% on room air.  He was placed on 4 L in triage with improvement to 88%.  Wife reports that she recently tested positive for RSV that was complicated by pneumonia.  Reports compliance with his diuretics and has been peeing normally, but does think he may have some fluid overload.  Does feel wheezy.    Physical Exam   Triage Vital Signs: ED Triage Vitals [07/05/23 2027]  Encounter Vitals Group     BP (!) 172/99     Systolic BP Percentile      Diastolic BP Percentile      Pulse Rate (!) 113     Resp (!) 30     Temp 99.5 F (37.5 C)     Temp Source Oral     SpO2 (!) 70 %     Weight 147 lb (66.7 kg)     Height 6\' 1"  (1.854 m)     Head Circumference      Peak Flow      Pain Score 8     Pain Loc      Pain Education      Exclude from Growth Chart     Most recent vital signs: Vitals:   07/05/23 2215 07/05/23 2230  BP:  (!) 139/98  Pulse: (!) 109 (!) 106  Resp: (!) 29 (!) 28  Temp:    SpO2: (!) 89% 92%     General: Awake, interactive  CV:  Tachycardic with regular rhythm, normal peripheral perfusion Resp:  Tachypnea with labored respirations, lung sounds diminished with expiratory wheezing Abd:  Nondistended.  Neuro:  Symmetric facial movement, fluid speech   ED Results / Procedures / Treatments   Labs (all labs ordered are listed, but only abnormal results are displayed) Labs  Reviewed  RESP PANEL BY RT-PCR (RSV, FLU A&B, COVID)  [id number] - Abnormal; Notable for the following components:      Result Value   Resp Syncytial Virus by PCR POSITIVE (*)    All other components within normal limits  BASIC METABOLIC PANEL - Abnormal; Notable for the following components:   Potassium 3.3 (*)    Chloride 94 (*)    Glucose, Bld 108 (*)    BUN 28 (*)    Anion gap 19 (*)    All other components within normal limits  CBC - Abnormal; Notable for the following components:   WBC 19.1 (*)    MCV 102.3 (*)    MCH 34.9 (*)    All other components within normal limits  PROTIME-INR - Abnormal; Notable for the following components:   Prothrombin Time 15.7 (*)    All other components within normal limits  LACTIC ACID, PLASMA - Abnormal; Notable for the following components:   Lactic Acid, Venous 2.0 (*)    All other components within normal limits  BLOOD GAS, VENOUS - Abnormal;  Notable for the following components:   pH, Ven 7.55 (*)    Bicarbonate 38.5 (*)    Acid-Base Excess 14.4 (*)    All other components within normal limits  HEPATIC FUNCTION PANEL - Abnormal; Notable for the following components:   Alkaline Phosphatase 138 (*)    Total Bilirubin 1.4 (*)    Bilirubin, Direct 0.3 (*)    Indirect Bilirubin 1.1 (*)    All other components within normal limits  BRAIN NATRIURETIC PEPTIDE - Abnormal; Notable for the following components:   B Natriuretic Peptide 1,021.5 (*)    All other components within normal limits  TROPONIN I (HIGH SENSITIVITY) - Abnormal; Notable for the following components:   Troponin I (High Sensitivity) 69 (*)    All other components within normal limits  CULTURE, BLOOD (ROUTINE X 2)  CULTURE, BLOOD (ROUTINE X 2)  LACTIC ACID, PLASMA  TROPONIN I (HIGH SENSITIVITY)     EKG EKG independently reviewed interpreted by myself (ER attending) demonstrates:  EKG demonstrates sinus tachycardia at a rate of 114, PR 136, QRS 126, QTc 482, right bundle  branch block present, ST changes in multiple leads, overall seems similar to EKG from 06/29/2023, though some baseline wander makes it more difficult to interpret  RADIOLOGY Imaging independently reviewed and interpreted by myself demonstrates:  CXR with left-sided densities concerning for infiltrate when compared to prior  PROCEDURES:  Critical Care performed: Yes, see critical care procedure note(s)  CRITICAL CARE Performed by: Trinna Post   Total critical care time: 32 minutes  Critical care time was exclusive of separately billable procedures and treating other patients.  Critical care was necessary to treat or prevent imminent or life-threatening deterioration.  Critical care was time spent personally by me on the following activities: development of treatment plan with patient and/or surrogate as well as nursing, discussions with consultants, evaluation of patient's response to treatment, examination of patient, obtaining history from patient or surrogate, ordering and performing treatments and interventions, ordering and review of laboratory studies, ordering and review of radiographic studies, pulse oximetry and re-evaluation of patient's condition.   Procedures   MEDICATIONS ORDERED IN ED: Medications  azithromycin (ZITHROMAX) 500 mg in sodium chloride 0.9 % 250 mL IVPB (0 mg Intravenous Stopped 07/05/23 2257)  ceFEPIme (MAXIPIME) 2 g in sodium chloride 0.9 % 100 mL IVPB (0 g Intravenous Stopped 07/05/23 2218)  methylPREDNISolone sodium succinate (SOLU-MEDROL) 125 mg/2 mL injection 125 mg (125 mg Intravenous Given 07/05/23 2137)  ipratropium-albuterol (DUONEB) 0.5-2.5 (3) MG/3ML nebulizer solution 6 mL (6 mLs Nebulization Given 07/05/23 2140)     IMPRESSION / MDM / ASSESSMENT AND PLAN / ED COURSE  I reviewed the triage vital signs and the nursing notes.  Differential diagnosis includes, but is not limited to, pneumonia, COPD exacerbation, CHF exacerbation, consideration  for PE though overall lower suspicion in the setting of with anticoagulation, recent negative CTA of the chest, alternative etiologies of shortness of breath  Patient's presentation is most consistent with acute presentation with potential threat to life or bodily function.  57 year old male presenting with shortness of breath.  Tachycardic, hypoxic, tachypneic on presentation.  Labs sent from triage notable for worsening leukocytosis with WC of 19.1.  Troponin elevated at 69, slightly increased from prior.  Respiratory panel did return positive for RSV, but x-Letizia Hook is concerning for developing infiltrate.  Patient does meet sepsis criteria.  Ordered for empiric cefepime and azithromycin.  Fluids held as I do suspect patient has a component of fluid overload  in the setting of his CHF.  Also ordered for COPD treatment with DuoNebs and Solu-Medrol.  Increased work of breathing on presentation at the time of my initial evaluation, but VBG without hypercarbia, do not thing there is an indication for BiPAP currently.  Given significant hypoxia on presentation, anticipate patient will require admission.  Additional labs resulted with mild lactic acidosis at 2, BNP elevated at 1021.  Patient reassessed.  Has mild wheezing, but much improved air movement and improvement in work of breathing.  Discussed admission for evaluation of his RSV with pneumonia, COPD exacerbation, possible CHF exacerbation with elevated BNP.  Patient agreeable with this plan.  Will reach out to hospitalist team.  Case reviewed with Dr. Para March.  She will evaluate the patient for anticipated admission.     FINAL CLINICAL IMPRESSION(S) / ED DIAGNOSES   Final diagnoses:  COPD exacerbation (HCC)  RSV (respiratory syncytial virus infection)  Community acquired pneumonia of left lung, unspecified part of lung  Acute on chronic congestive heart failure, unspecified heart failure type (HCC)     Rx / DC Orders   ED Discharge Orders      None        Note:  This document was prepared using Dragon voice recognition software and may include unintentional dictation errors.   Trinna Post, MD 07/05/23 [phone]

## 2023-07-05 NOTE — Progress Notes (Signed)
Pharmacy Antibiotic Note  Samuel Choi is a 57 y.o. male admitted on 07/05/2023 with HCAP.  Pharmacy has been consulted for Cefepime & Vancomycin dosing for 7 days.  Plan: Cefepime 2 gm q8hr per indication & renal fxn.  Pt given Vancomycin 1500 mg once. Vancomycin 1250 mg IV Q 24 hrs. Goal AUC 400-550. Expected AUC: 455.5 SCr used: 1.21, TBW 66.7 kg < IBW 79.9 kg  Pharmacy will continue to follow and will adjust abx dosing whenever warranted.  Temp (24hrs), Avg:99.5 F (37.5 C), Min:99.5 F (37.5 C), Max:99.5 F (37.5 C)   Recent Labs  Lab 06/29/23 1119 06/30/23 0319 07/01/23 0445 07/05/23 2037 07/05/23 2124 07/05/23 2304  WBC 14.5*  --  18.9* 19.1*  --   --   CREATININE 0.84 1.10 0.92 1.21  --   --   LATICACIDVEN  --   --   --   --  2.0* 1.2    Estimated Creatinine Clearance: 63.5 mL/min (by C-G formula based on SCr of 1.21 mg/dL).    No Known Allergies  Antimicrobials this admission: 12/26 Azithromycin >> x 1 dose 12/26 Cefepime >> x 7 days 12/27 Vancomycin >> x 7 days  Microbiology results: 12/26 BCx: Pending 12/26 Resp Panel: RSV Positivie  Thank you for allowing pharmacy to be a part of this patient's care.  Otelia Sergeant, PharmD, [facility] 07/05/2023 11:47 PM

## 2023-07-05 NOTE — Progress Notes (Signed)
CODE SEPSIS - PHARMACY COMMUNICATION  **Broad Spectrum Antibiotics should be administered within 1 hour of Sepsis diagnosis**  Time Code Sepsis Called/Page Received: 21:21  Antibiotics Ordered: Cefepime and Azithromycin  Time of 1st antibiotic administration: Cefepime given at 21:39  Additional action taken by pharmacy: n/a  If necessary, Name of Provider/Nurse Contacted: n/a    Foye Deer ,PharmD Clinical Pharmacist  07/05/2023  9:46 PM

## 2023-07-05 NOTE — H&P (Signed)
History and Physical    Patient: Samuel Choi NWG:[id number] DOB: 05-16-1966 DOA: 07/05/2023 DOS: the patient was seen and examined on 07/05/2023 PCP: Barbette Reichmann, MD  Patient coming from: Home  Chief Complaint:  Chief Complaint  Patient presents with   Shortness of Breath    HPI: Samuel Choi is a 57 y.o. 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  He denies chest pain, lower extremity pain.  Does have lower extremity swelling.  In triage O2 sats were 88% and he had increased work of breathing. ED course and data review: On arrival temp 99.5 tachycardic to the 1 teens respirations up to 30 with O2 sat 70% on room air requiring 4 L to maintain sats in the low 90s. Notable findings on workup include the following: VBG on 4 L with pH 7.55 and pCO2 44 BNP 1021 up from 897 a week ago, first troponin 69 Respiratory panel positive for RSV WBC 19,000 with lactic acid 2.0 Anion gap 19, potassium 3.3, total bili 1.4 EKG, personally viewed and interpreted showing sinus tachycardia at 114 with RBBB Chest x-ray suggesting possible early pneumonia as follows: IMPRESSION: Slight progression of left mid to lower lung field reticulonodular densities may represent developing infiltrate.  Patient treated with DuoNebs and methylprednisolone and started on cefepime, azithromycin. Hospitalist consulted for admission.     Past Medical History:  Diagnosis Date   Alcohol abuse    usually drinks 2-3 drinks per day   Atherosclerosis 06/2018   Cancer  associated pain    Chronic sinusitis    Dehydration 02/07/2019   Emphysema of lung (HCC) 06/2018   patient unaware of this.   Hip fracture (HCC) 05/2018   no surgery   History of kidney stones 05/2018   per xray, bilateral nephrolitiasis   Hypertension    Squamous cell carcinoma of lung, right (HCC) 06/2018   Past Surgical History:  Procedure Laterality Date   BRAIN SURGERY  10/2017   nasal/sinus endoscopy. mass benign   ELECTROMAGNETIC NAVIGATION BROCHOSCOPY Right 06/25/2018   Procedure: ELECTROMAGNETIC NAVIGATION BRONCHOSCOPY;  Surgeon: Erin Fulling, MD;  Location: [facility];  Service: Cardiopulmonary;  Laterality: Right;   ENDOBRONCHIAL ULTRASOUND Right 06/25/2018   Procedure: ENDOBRONCHIAL ULTRASOUND;  Surgeon: Erin Fulling, MD;  Location: [facility];  Service: Cardiopulmonary;  Laterality: Right;   ENDOBRONCHIAL ULTRASOUND Right 12/26/2018   Procedure: ENDOBRONCHIAL ULTRASOUND RIGHT;  Surgeon: Erin Fulling, MD;  Location: [facility];  Service: Cardiopulmonary;  Laterality: Right;   FLEXIBLE BRONCHOSCOPY N/A 08/20/2018   Procedure: FLEXIBLE BRONCHOSCOPY PREOP;  Surgeon: Hulda Marin, MD;  Location: [facility];  Service: Thoracic;  Laterality: N/A;   IR CV LINE INJECTION  04/04/2019   NASAL SINUS SURGERY  10/2017   At [facility], frontal sinusotomy, ethmoidectomy, resection anterior cranial fossa neoplasm, turbinate resection   PERIPHERAL VASCULAR THROMBECTOMY Left 12/31/2020   Procedure: PERIPHERAL VASCULAR THROMBECTOMY;  Surgeon: Annice Needy, MD;  Location: [facility];  Service: Cardiovascular;  Laterality: Left;   PORTACATH PLACEMENT Left 01/15/2019   Procedure: INSERTION PORT-A-CATH;  Surgeon: Hulda Marin, MD;  Location: [facility];  Service: General;  Laterality: Left;   THORACOTOMY Right 08/13/2018   Procedure: PREOP BROCHOSCOPY WITH RIGHT THORACOTOMY AND RUL RESECTION;  Surgeon: Hulda Marin, MD;  Location: [facility];  Service: General;  Laterality: Right;   THORACOTOMY Right  08/20/2018   Procedure: THORACOTOMY MAJOR RIGHT UPPER LOBE LOBECTOMY;  Surgeon: Hulda Marin, MD;  Location: [facility];  Service: Thoracic;  Laterality: Right;   TOE SURGERY Left    pin in left toe   Social History:  reports that he quit smoking about 4 years ago. His smoking use included cigarettes. He started smoking about 19 years ago. He has a 7.5 pack-year smoking history. He has never used smokeless tobacco. He reports current alcohol use of about 3.0 standard drinks of alcohol per week. He reports that he does not use drugs.  No Known Allergies  Family History  Problem Relation Age of Onset   Breast cancer Mother    Diabetes Mother    Lung cancer Father    Hypertension Father     Prior to Admission medications   Medication Sig Start Date End Date Taking? Authorizing Provider  albuterol (PROVENTIL) (2.5 MG/3ML) 0.083% nebulizer solution Take 3 mLs (2.5 mg total) by nebulization every 6 (six) hours as needed for wheezing or shortness of breath. 06/15/23   Leeroy Bock, MD  albuterol (VENTOLIN HFA) 108 (90 Base) MCG/ACT inhaler INHALE 2 PUFFS BY MOUTH EVERY 6 HOURS AS NEEDED FOR WHEEZING OR SHORTNESS OF BREATH 06/15/23   Leeroy Bock, MD  apixaban (ELIQUIS) 2.5 MG TABS tablet Take 1 tablet (2.5 mg total) by mouth 2 (two) times daily. 06/15/23 07/15/23  Leeroy Bock, MD  Budeson-Glycopyrrol-Formoterol (BREZTRI AEROSPHERE) 160-9-4.8 MCG/ACT AERO Inhale 2 puffs into the lungs in the morning and at bedtime. 06/15/23   Leeroy Bock, MD  cloNIDine (CATAPRES) 0.1 MG tablet Take 1 tablet (0.1 mg total) by mouth 3 (three) times daily. 06/15/23 06/14/24  Leeroy Bock, MD  cyanocobalamin (VITAMIN B12) 1000 MCG tablet Take 1 tablet (1,000 mcg total) by mouth daily. 06/27/22   Borders, Daryl Eastern, NP  diltiazem (CARDIZEM CD) 120 MG 24 hr capsule Take 120 mg by mouth daily. 06/19/19   [provider]  feeding supplement (ENSURE ENLIVE / ENSURE PLUS) LIQD Take 237 mLs  by mouth 3 (three) times daily between meals. 02/05/22   Sunnie Nielsen, DO  furosemide (LASIX) 40 MG tablet Take 1 tablet (40 mg total) by mouth daily as needed for fluid or edema. 07/01/23 06/30/24  Amin, Ankit C, MD  latanoprost (XALATAN) 0.005 % ophthalmic solution Place 1 drop into both eyes at bedtime. 05/04/21   [provider]  loratadine (CLARITIN) 10 MG tablet TAKE 1 TABLET(10 MG) BY MOUTH DAILY AS NEEDED FOR ALLERGIES 02/12/23   Rickard Patience, MD  magnesium chloride (SLOW-MAG) 64 MG TBEC SR tablet Take 1 tablet (64 mg total) by mouth daily. 07/24/22   Rickard Patience, MD  metoprolol succinate (TOPROL-XL) 25 MG 24 hr tablet Take 1 tablet (25 mg total) by mouth daily. 11/28/22   Rickard Patience, MD  Multiple Vitamin (MULTIVITAMIN WITH MINERALS) TABS tablet Take 1 tablet by mouth daily. 02/05/22   Sunnie Nielsen, DO  pantoprazole (PROTONIX) 20 MG tablet TAKE 1 TABLET BY MOUTH DAILY 02/23/23   Rickard Patience, MD  potassium chloride SA (KLOR-CON M) 20 MEQ tablet Take 1 tablet (20 mEq total) by mouth daily. 10/26/22   Rickard Patience, MD  predniSONE (DELTASONE) 20 MG tablet Take 2 tablets (40 mg total) by mouth  daily with breakfast for 3 days. 07/02/23 07/05/23  Miguel Rota, MD    Physical Exam: Vitals:   07/05/23 2215 07/05/23 2230 07/05/23 2245 07/05/23 2300  BP:  (!) 139/98  (!) 136/95  Pulse: (!) 109 (!) 106 (!) 104 (!) 102  Resp: (!) 29 (!) 28 (!) 29 (!) 27  Temp:      TempSrc:      SpO2: (!) 89% 92% 91% 90%  Weight:      Height:       Physical Exam Vitals and nursing note reviewed.  Constitutional:      Appearance: He is cachectic.  HENT:     Head: Normocephalic and atraumatic.  Cardiovascular:     Rate and Rhythm: Regular rhythm. Tachycardia present.     Heart sounds: Normal heart sounds.  Pulmonary:     Effort: Tachypnea present.     Breath sounds: Wheezing present.     Comments: Tachypneic and speaking in short Abdominal:     Palpations: Abdomen is soft.     Tenderness: There is no  abdominal tenderness.  Neurological:     Mental Status: Mental status is at baseline.     Labs on Admission: I have personally reviewed following labs and imaging studies  CBC: Recent Labs  Lab 06/29/23 1119 07/01/23 0445 07/05/23 2037  WBC 14.5* 18.9* 19.1*  HGB 13.0 12.7* 14.9  HCT 38.8* 36.8* 43.7  MCV 104.9* 100.3* 102.3*  PLT 138* 122* 158   Basic Metabolic Panel: Recent Labs  Lab 06/29/23 1119 06/30/23 0319 07/01/23 0445 07/05/23 2037  NA 142 137 134* 143  K 3.0* 3.7 3.1* 3.3*  CL 104 98 90* 94*  CO2 20* 27 32 30  GLUCOSE 55* 132* 95 108*  BUN 18 25* 27* 28*  CREATININE 0.84 1.10 0.92 1.21  CALCIUM 8.4* 8.5* 8.8* 9.8  MG 1.6*  --  1.6*  --   PHOS  --   --  2.6  --    GFR: Estimated Creatinine Clearance: 63.5 mL/min (by C-G formula based on SCr of 1.21 mg/dL). Liver Function Tests: Recent Labs  Lab 06/29/23 1119 07/05/23 2037  AST 27 22  ALT 65* 32  ALKPHOS 139* 138*  BILITOT 1.4* 1.4*  PROT 6.8 8.0  ALBUMIN 3.2* 4.2   Recent Labs  Lab 06/29/23 1119  LIPASE 25   No results for input(s): "AMMONIA" in the last 168 hours. Coagulation Profile: Recent Labs  Lab 06/29/23 1119 07/05/23 2037  INR 1.4* 1.2   Cardiac Enzymes: No results for input(s): "CKTOTAL", "CKMB", "CKMBINDEX", "TROPONINI" in the last 168 hours. BNP (last 3 results) No results for input(s): "PROBNP" in the last 8760 hours. HbA1C: No results for input(s): "HGBA1C" in the last 72 hours. CBG: Recent Labs  Lab 06/29/23 1551  GLUCAP 74   Lipid Profile: No results for input(s): "CHOL", "HDL", "LDLCALC", "TRIG", "CHOLHDL", "LDLDIRECT" in the last 72 hours. Thyroid Function Tests: No results for input(s): "TSH", "T4TOTAL", "FREET4", "T3FREE", "THYROIDAB" in the last 72 hours. Anemia Panel: No results for input(s): "VITAMINB12", "FOLATE", "FERRITIN", "TIBC", "IRON", "RETICCTPCT" in the last 72 hours. Urine analysis:    Component Value Date/Time   COLORURINE Yellow 11/02/2014  1900   APPEARANCEUR Hazy 11/02/2014 1900   LABSPEC 1.013 11/02/2014 1900   PHURINE 9.0 11/02/2014 1900   GLUCOSEU Negative 11/02/2014 1900   HGBUR 1+ 11/02/2014 1900   BILIRUBINUR Negative 11/02/2014 1900   KETONESUR Negative 11/02/2014 1900   PROTEINUR 30 mg/dL 40/98/1191 4782  NITRITE Negative 11/02/2014 1900   LEUKOCYTESUR 3+ 11/02/2014 1900    Radiological Exams on Admission: DG Chest Port 1 View Result Date: 07/05/2023 CLINICAL DATA:  Shortness of breath. EXAM: PORTABLE CHEST 1 VIEW COMPARISON:  Chest radiograph dated 06/29/2023. FINDINGS: Left-sided Port-A-Cath with tip over central SVC. Postsurgical changes of the right hilum and areas of scarring in the right lower lung field. Left mid to lower lung field reticulonodular densities slightly progressed since the prior radiograph and may represent developing infiltrate. No large pleural effusion. No pneumothorax. Stable cardiomediastinal silhouette. No acute osseous pathology. IMPRESSION: Slight progression of left mid to lower lung field reticulonodular densities may represent developing infiltrate. Electronically Signed   By: Elgie Collard M.D.   On: 07/05/2023 21:00     Data Reviewed: Relevant notes from primary care and specialist visits, past discharge summaries as available in EHR, including Care Everywhere. Prior diagnostic testing as pertinent to current admission diagnoses Updated medications and problem lists for reconciliation ED course, including vitals, labs, imaging, treatment and response to treatment Triage notes, nursing and pharmacy notes and ED provider's notes Notable results as noted in HPI   Assessment and Plan: * Pneumonia due to respiratory syncytial virus (RSV) Continue cefepime and vanc Antitussives MRSA screen Incentive spirometer Droplet precautions  Acute on chronic respiratory failure with hypoxia (HCC) Multifactorial: RSV pneumonia with COPD exacerbation and mild CHF exacerbation with  background severe pulmonary hypertension with severe mitral regurg as well as lung cancer Low suspicion for PE as patient on chronic anticoagulation Continue supplemental oxygen and wean as tolerated to baseline O2 Treatment for each acute exacerbations addressed under specific problems  Acute on chronic diastolic CHF (congestive heart failure) (HCC) Severe tricuspid regurgitation on echo 12/24 Severe pulmonary hypertension EF 65 to 70% 06/30/2023 Will give one-time IV Lasix and monitor, given concomitant suspicion for sepsis Continue metoprolol  Lactic acidosis Possible sepsis Suspect lactic acidosis secondary to acute respiratory distress though sepsis a possibility Sepsis criteria will include temp of 99.5, tachycardia and tachypnea with leukocytosis and lactic acidosis Will get procalcitonin Will give cautious 500 mL NS bolus x 2 and cautiously monitor for any worsening of CHF  Squamous cell carcinoma of right lung (HCC) No acute issues suspected  Essential hypertension Cautiously continue metoprolol, diltiazem, clonidine  History of DVT and PE Chronic anticoagulation Continue apixaban  Protein calorie malnutrition (HCC) Consider dietary eval  Hypokalemia Replete and monitor  Alcohol use disorder Monitor for withdrawal    DVT prophylaxis: Apixaban  Consults: none  Advance Care Planning:   Code Status: Prior   Family Communication: none  Disposition Plan: Back to previous home environment  Severity of Illness: The appropriate patient status for this patient is INPATIENT. Inpatient status is judged to be reasonable and necessary in order to provide the required intensity of service to ensure the patient's safety. The patient's presenting symptoms, physical exam findings, and initial radiographic and laboratory data in the context of their chronic comorbidities is felt to place them at high risk for further clinical deterioration. Furthermore, it is not anticipated  that the patient will be medically stable for discharge from the hospital within 2 midnights of admission.   * I certify that at the point of admission it is my clinical judgment that the patient will require inpatient hospital care spanning beyond 2 midnights from the point of admission due to high intensity of service, high risk for further deterioration and high frequency of surveillance required.* CRITICAL CARE Performed by: Andris Baumann  Total critical care time: 74 minutes  Critical care time was exclusive of separately billable procedures and treating other patients.  Critical care was necessary to treat or prevent imminent or life-threatening deterioration.  Critical care was time spent personally by me on the following activities: development of treatment plan with patient and/or surrogate as well as nursing, discussions with consultants, evaluation of patient's response to treatment, examination of patient, obtaining history from patient or surrogate, ordering and performing treatments and interventions, ordering and review of laboratory studies, ordering and review of radiographic studies, pulse oximetry and re-evaluation of patient's condition.  Author: Andris Baumann, MD 07/05/2023 11:30 PM  For on call review [email or web address].

## 2023-07-05 NOTE — Assessment & Plan Note (Signed)
Consider dietary eval

## 2023-07-05 NOTE — Assessment & Plan Note (Addendum)
Severe tricuspid regurgitation on echo 12/24 Severe pulmonary hypertension EF 65 to 70% 06/30/2023 Will give one-time IV Lasix and monitor, given concomitant suspicion for sepsis Continue metoprolol

## 2023-07-05 NOTE — ED Notes (Signed)
Resumed care from tracey rn.  Pt alert.  Pt on 4 liters oxygen [place].  Iv in place.

## 2023-07-05 NOTE — Assessment & Plan Note (Addendum)
Multifactorial: RSV pneumonia with COPD exacerbation and mild CHF exacerbation with background severe pulmonary hypertension with severe mitral regurg as well as lung cancer Low suspicion for PE as patient on chronic anticoagulation Continue supplemental oxygen and wean as tolerated to baseline O2 Treatment for each acute exacerbations addressed under specific problems

## 2023-07-05 NOTE — Assessment & Plan Note (Signed)
No acute issues suspected 

## 2023-07-06 ENCOUNTER — Ambulatory Visit: Admission: RE | Admit: 2023-07-06 | Payer: Medicare Other | Source: Ambulatory Visit

## 2023-07-06 DIAGNOSIS — E43 Unspecified severe protein-calorie malnutrition: Secondary | ICD-10-CM | POA: Insufficient documentation

## 2023-07-06 DIAGNOSIS — J121 Respiratory syncytial virus pneumonia: Secondary | ICD-10-CM | POA: Diagnosis not present

## 2023-07-06 LAB — BASIC METABOLIC PANEL
Anion gap: 17 — ABNORMAL HIGH (ref 5–15)
BUN: 30 mg/dL — ABNORMAL HIGH (ref 6–20)
CO2: 29 mmol/L (ref 22–32)
Calcium: 8.9 mg/dL (ref 8.9–10.3)
Chloride: 92 mmol/L — ABNORMAL LOW (ref 98–111)
Creatinine, Ser: 1.17 mg/dL (ref 0.61–1.24)
GFR, Estimated: >60 mL/min (ref >60)
Glucose, Bld: 143 mg/dL — ABNORMAL HIGH (ref 70–99)
Potassium: 3.5 mmol/L (ref 3.5–5.1)
Sodium: 138 mmol/L (ref 135–145)

## 2023-07-06 LAB — HIV ANTIBODY (ROUTINE TESTING W REFLEX): HIV Screen 4th Generation wRfx: NONREACTIVE

## 2023-07-06 LAB — CBC
HCT: 40 % (ref 39.0–52.0)
Hemoglobin: 13.3 g/dL (ref 13.0–17.0)
MCH: 33.9 pg (ref 26.0–34.0)
MCHC: 33.3 g/dL (ref 30.0–36.0)
MCV: 102 fL — ABNORMAL HIGH (ref 80.0–100.0)
Platelets: 145 10*3/uL — ABNORMAL LOW (ref 150–400)
RBC: 3.92 MIL/uL — ABNORMAL LOW (ref 4.22–5.81)
RDW: 14 % (ref 11.5–15.5)
WBC: 15.7 10*3/uL — ABNORMAL HIGH (ref 4.0–10.5)
nRBC: 0 % (ref 0.0–0.2)

## 2023-07-06 LAB — PROCALCITONIN: Procalcitonin: 0.18 ng/mL

## 2023-07-06 MED ORDER — MOMETASONE FURO-FORMOTEROL FUM 200-5 MCG/ACT IN AERO
2.0000 | INHALATION_SPRAY | Freq: Two times a day (BID) | RESPIRATORY_TRACT | Status: DC
  Administered 2023-07-06 – 2023-07-08 (×5): 2 via RESPIRATORY_TRACT
  Filled 2023-07-06 (×2): qty 8.8

## 2023-07-06 MED ORDER — CLONIDINE HCL 0.1 MG PO TABS
0.1000 mg | ORAL_TABLET | Freq: Three times a day (TID) | ORAL | Status: DC
Start: 2023-07-06 — End: 2023-07-09
  Administered 2023-07-06 – 2023-07-09 (×10): 0.1 mg via ORAL
  Filled 2023-07-06 (×10): qty 1

## 2023-07-06 MED ORDER — FUROSEMIDE 10 MG/ML IJ SOLN
40.0000 mg | Freq: Every day | INTRAMUSCULAR | Status: DC
  Administered 2023-07-06 – 2023-07-07 (×2): 40 mg via INTRAVENOUS
  Filled 2023-07-06 (×2): qty 4

## 2023-07-06 MED ORDER — SODIUM CHLORIDE 0.9 % IV SOLN
2.0000 g | INTRAVENOUS | Status: DC
  Administered 2023-07-06: 2 g via INTRAVENOUS
  Filled 2023-07-06: qty 20

## 2023-07-06 MED ORDER — PANTOPRAZOLE SODIUM 40 MG PO TBEC
40.0000 mg | DELAYED_RELEASE_TABLET | Freq: Every day | ORAL | Status: DC
Start: 2023-07-06 — End: 2023-07-09
  Administered 2023-07-06 – 2023-07-09 (×4): 40 mg via ORAL
  Filled 2023-07-06 (×4): qty 1

## 2023-07-06 MED ORDER — LATANOPROST 0.005 % OP SOLN
1.0000 [drp] | Freq: Every day | OPHTHALMIC | Status: DC
  Administered 2023-07-06 – 2023-07-08 (×3): 1 [drp] via OPHTHALMIC
  Filled 2023-07-06: qty 2.5

## 2023-07-06 MED ORDER — BUDESON-GLYCOPYRROL-FORMOTEROL 160-9-4.8 MCG/ACT IN AERO: 2.0000 | INHALATION_SPRAY | Freq: Two times a day (BID) | RESPIRATORY_TRACT | Status: DC

## 2023-07-06 MED ORDER — VANCOMYCIN HCL 1250 MG/250ML IV SOLN: 1250.0000 mg | INTRAVENOUS | Status: DC

## 2023-07-06 NOTE — Plan of Care (Signed)

## 2023-07-06 NOTE — Progress Notes (Signed)
Heart Failure Navigator Progress Note  Assessed for Heart & Vascular TOC clinic readiness.  Patient does not meet criteria due to current [facility] patient of Dr. Larey Dresser.  Navigator will sign off at this time.  Roxy Horseman, RN, BSN [facility] Heart Failure Navigator Secure Chat Only

## 2023-07-06 NOTE — Progress Notes (Signed)
Initial Nutrition Assessment  DOCUMENTATION CODES:   Underweight, Severe malnutrition in context of chronic illness  INTERVENTION:   -Ensure Enlive po BID, each supplement provides 350 kcal and 20 grams of protein.  -MVI with minerals daily -Liberalize diet to 2 gram sodium for wider variety of meal selections  NUTRITION DIAGNOSIS:   Severe Malnutrition related to chronic illness (COPD, CHF) as evidenced by severe muscle depletion, severe fat depletion.  GOAL:   Patient will meet greater than or equal to 90% of their needs  MONITOR:   PO intake, Supplement acceptance  REASON FOR ASSESSMENT:   Consult Assessment of nutrition requirement/status  ASSESSMENT:   Pt with medical history significant for COPD on nocturnal oxygen at 2 L, diastolic CHF, severe pulmonary hypertension with severe tract cuspid regurgitation, history of PE on chronic anticoagulation, NSCLC, HTN and malnutrition, alcohol use disorder, hospitalized twice in the past month with acute hypoxia attributed to CHF and COPD, most recently from 12/20 to 07/01/2023, who returns to the hospital  with ongoing shortness of breath stating progressive worsening since his recent discharge.  Pt admitted with pneumonia secondary to RSV.   Reviewed I/O's: -450 ml x 24 hours  UOP: 450 ml x 24 hours   Spoke with pt at bedside, who was pleasant and in good spirits today. He reports feeling better since admission. Pt reports he he has a good appetite and consumed almost all of his lunch today. PTA, appetite was also good. He typically consumes 3 meals per day (Breakfast: egg, bacon, and cheese biscuit; Lunch: hamburger and fries, Dinner: meat, starch, and vegetable).   Pt reports his UBW is around 157#. He shares that his weight is stable and that "I've never been a big person". He denies any weakness or falls at home. He does not weigh himself consistently at home. Reviewed wt hx; pt has experinced a 3% wt loss over the past 3  months, which is not significant for time frame.   Discussed importance of good meal and supplement intake to promote healing. Pt amenable to Ensure supplements, stating he consumes 2 Boost supplements daily PTA. Encouraged pt continue this practice at discharge.   Medications reviewed and include cardizem, lasix, magnesium chloride, solu-medrol, potassium chloride, and protonix.   Lab Results  Component Value Date   HGBA1C 4.8 01/25/2022   PTA DM medications are none.   Labs reviewed: Mg: 1.6, CBGS: 74 (inpatient orders for glycemic control are none).    NUTRITION - FOCUSED PHYSICAL EXAM:  Flowsheet Row Most Recent Value  Orbital Region Severe depletion  Upper Arm Region Severe depletion  Thoracic and Lumbar Region Severe depletion  Buccal Region Severe depletion  Temple Region Severe depletion  Clavicle Bone Region Severe depletion  Clavicle and Acromion Bone Region Severe depletion  Scapular Bone Region Severe depletion  Dorsal Hand Severe depletion  Patellar Region Severe depletion  Anterior Thigh Region Severe depletion  Posterior Calf Region Severe depletion  Edema (RD Assessment) None  Hair Reviewed  Eyes Reviewed  Mouth Reviewed  Skin Reviewed  Nails Reviewed       Diet Order:   Diet Order             Diet 2 gram sodium Fluid consistency: Thin  Diet effective now                   EDUCATION NEEDS:   Education needs have been addressed  Skin:  Skin Assessment: Reviewed RN Assessment  Last BM:  Unknown  Height:   Ht Readings from Last 1 Encounters:  07/06/23 6\' 1"  (1.854 m)    Weight:   Wt Readings from Last 1 Encounters:  07/06/23 60 kg    Ideal Body Weight:  83.6 kg  BMI:  Body mass index is 17.45 kg/m.  Estimated Nutritional Needs:   Kcal:  1800-2000  Protein:  90-105 grams  Fluid:  > 1.8 L    Levada Schilling, RD, LDN, CDCES Registered Dietitian III Certified Diabetes Care and Education Specialist If unable to reach this RD,  please use "RD Inpatient" group chat on secure chat between hours of 8am-4 pm daily

## 2023-07-06 NOTE — ED Notes (Signed)
Pt up using bathroom at this time.

## 2023-07-06 NOTE — Progress Notes (Signed)
OT Screen Note  Patient Details Name: Samuel Choi MRN: [id number] DOB: [birth year]   Cancelled Treatment:    Reason Eval/Treat Not Completed: OT screened, no needs identified, will sign off. Order received, chart reviewed. Pt near baseline functional independence. Denies needs/concerns at this time. No skilled OT needs identified. Will sign off. Please re-consult if additional needs arise.   Arman Filter., MPH, MS, OTR/L ascom [phone] 07/06/23, 9:42 AM

## 2023-07-06 NOTE — ED Notes (Signed)
Pt voided 1000 cc urine

## 2023-07-06 NOTE — ED Notes (Signed)
Dietary called for ensure

## 2023-07-06 NOTE — ED Notes (Signed)
Pt working with pt at this time.

## 2023-07-06 NOTE — Progress Notes (Signed)
PROGRESS NOTE    Samuel Choi  NWG:[id number] DOB: Nov 17, 1965 DOA: 07/05/2023 PCP: Barbette Reichmann, MD    Brief Narrative:  57 y.o. 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  He denies chest pain, lower extremity pain.  Does have lower extremity swelling.  In triage O2 sats were 88% and he had increased work of breathing.   Assessment & Plan:   Principal Problem:   Pneumonia due to respiratory syncytial virus (RSV) Active Problems:   Acute on chronic respiratory failure with hypoxia (HCC)   Severe pulmonary hypertension (HCC)   Acute on chronic diastolic CHF (congestive heart failure) (HCC)   COPD with acute exacerbation (HCC)   Severe tricuspid regurgitation by prior echocardiogram   Essential hypertension   Squamous cell carcinoma of right lung (HCC)   Lactic acidosis   History of DVT and PE   Chronic anticoagulation   Protein calorie malnutrition (HCC)   Hypokalemia   Alcohol use disorder   RSV (respiratory syncytial virus pneumonia)   Protein-calorie malnutrition, severe  Pneumonia due to respiratory syncytial virus (RSV) Negative procalcitonin.  Relatively low suspicion for bacterial coinfection at this time.  Suspect this is mainly viral in nature.  Discontinue broad-spectrum IV antibiotics.  Continue azithromycin.  Continue steroids and bronchodilators for suspected viral syndrome.   Acute on chronic respiratory failure with hypoxia (HCC) Multifactorial: RSV pneumonia with COPD exacerbation and mild CHF exacerbation with  background severe pulmonary hypertension with severe mitral regurg as well as lung cancer Low suspicion for PE as patient on chronic anticoagulation Continue supplemental oxygen and wean as tolerated to baseline O2 Treatment for each acute exacerbations addressed under specific problems   Acute on chronic diastolic CHF (congestive heart failure) (HCC) Severe tricuspid regurgitation on echo 12/24 Severe pulmonary hypertension EF 65 to 70% 06/30/2023 Daily IV Lasix for now.  Monitor volume status carefully.   Lactic acidosis Possible sepsis Suspect lactic acidosis secondary to acute respiratory distress though sepsis a possibility Sepsis criteria will include temp of 99.5, tachycardia and tachypnea with leukocytosis and lactic acidosis Procalcitonin negative.  No IV fluids.  Discontinued broad-spectrum IV antibiotics.   Squamous cell carcinoma of right lung (HCC) No acute issues suspected   Essential hypertension Cautiously continue metoprolol, diltiazem, clonidine   History of DVT and PE Chronic anticoagulation Continue apixaban   Protein calorie malnutrition (HCC) Consider dietary eval   Hypokalemia Replete and monitor   Alcohol use disorder Monitor for withdrawal     DVT prophylaxis: Apixaban Code Status: DNR Family Communication: None today Disposition Plan: Status is: Inpatient Remains inpatient appropriate because: Hypoxic respiratory failure in the setting of RSV infection   Level of care: Med-Surg  Consultants:  None  Procedures:  None  Antimicrobials: Azithromycin   Subjective: Seen and examined.  Sitting in bed.  Reports shortness of breath is improved.  Objective: Vitals:   07/06/23 1230 07/06/23 1302 07/06/23 1303 07/06/23 1446  BP: 130/82  137/88   Pulse: 100  99   Resp: (!) 24  20   Temp:   98.6 F (37 C)   TempSrc:  SpO2: 97%  97% 94%  Weight:  60 kg    Height:  6\' 1"  (1.854 m)      Intake/Output Summary (Last 24 hours) at  07/06/2023 1512 Last data filed at 07/06/2023 1251 Gross per 24 hour  Intake 240 ml  Output 1200 ml  Net -960 ml   Filed Weights   07/05/23 2027 07/06/23 1302  Weight: 66.7 kg 60 kg    Examination:  General exam: NAD Respiratory system: Scattered crackles bilaterally.  Normal work of breathing.  4 L Cardiovascular system: S1-S2, RRR, no murmurs, no pedal edema Gastrointestinal system: Thin, soft, NT/ND, normal bowel sounds Central nervous system: Alert and oriented. No focal neurological deficits. Extremities: Symmetric 5 x 5 power. Skin: No rashes, lesions or ulcers Psychiatry: Judgement and insight appear normal. Mood & affect appropriate.     Data Reviewed: I have personally reviewed following labs and imaging studies  CBC: Recent Labs  Lab 07/01/23 0445 07/05/23 2037 07/06/23 0505  WBC 18.9* 19.1* 15.7*  HGB 12.7* 14.9 13.3  HCT 36.8* 43.7 40.0  MCV 100.3* 102.3* 102.0*  PLT 122* 158 145*   Basic Metabolic Panel: Recent Labs  Lab 06/30/23 0319 07/01/23 0445 07/05/23 2037 07/06/23 0505  NA 137 134* 143 138  K 3.7 3.1* 3.3* 3.5  CL 98 90* 94* 92*  CO2 27 32 30 29  GLUCOSE 132* 95 108* 143*  BUN 25* 27* 28* 30*  CREATININE 1.10 0.92 1.21 1.17  CALCIUM 8.5* 8.8* 9.8 8.9  MG  --  1.6*  --   --   PHOS  --  2.6  --   --    GFR: Estimated Creatinine Clearance: 59.1 mL/min (by C-G formula based on SCr of 1.17 mg/dL). Liver Function Tests: Recent Labs  Lab 07/05/23 2037  AST 22  ALT 32  ALKPHOS 138*  BILITOT 1.4*  PROT 8.0  ALBUMIN 4.2   No results for input(s): "LIPASE", "AMYLASE" in the last 168 hours. No results for input(s): "AMMONIA" in the last 168 hours. Coagulation Profile: Recent Labs  Lab 07/05/23 2037  INR 1.2   Cardiac Enzymes: No results for input(s): "CKTOTAL", "CKMB", "CKMBINDEX", "TROPONINI" in the last 168 hours. BNP (last 3 results) No results for input(s): "PROBNP" in the last 8760 hours. HbA1C: No results for input(s):  "HGBA1C" in the last 72 hours. CBG: Recent Labs  Lab 06/29/23 1551  GLUCAP 74   Lipid Profile: No results for input(s): "CHOL", "HDL", "LDLCALC", "TRIG", "CHOLHDL", "LDLDIRECT" in the last 72 hours. Thyroid Function Tests: No results for input(s): "TSH", "T4TOTAL", "FREET4", "T3FREE", "THYROIDAB" in the last 72 hours. Anemia Panel: No results for input(s): "VITAMINB12", "FOLATE", "FERRITIN", "TIBC", "IRON", "RETICCTPCT" in the last 72 hours. Sepsis Labs: Recent Labs  Lab 07/05/23 2124 07/05/23 2303 07/05/23 2304  PROCALCITON  --  0.18  --   LATICACIDVEN 2.0*  --  1.2    Recent Results (from the past 240 hours)  Resp panel by RT-PCR (RSV, Flu A&B, Covid) Anterior Nasal Swab     Status: None   Collection Time: 06/29/23 11:19 AM   Specimen: Anterior Nasal Swab  Result Value Ref Range Status   SARS Coronavirus 2 by RT PCR NEGATIVE NEGATIVE Final    Comment: (NOTE) SARS-CoV-2 target nucleic acids are NOT DETECTED.  The SARS-CoV-2 RNA is generally detectable in upper respiratory specimens during the acute phase of infection. The lowest concentration of SARS-CoV-2 viral copies this assay can detect is 138 copies/mL. A negative result does not  preclude SARS-Cov-2 infection and should not be used as the sole basis for treatment or other patient management decisions. A negative result may occur with  improper specimen collection/handling, submission of specimen other than nasopharyngeal swab, presence of viral mutation(s) within the areas targeted by this assay, and inadequate number of viral copies(<138 copies/mL). A negative result must be combined with clinical observations, patient history, and epidemiological information. The expected result is Negative.  Fact Sheet for Patients:  BloggerCourse.com  Fact Sheet for Healthcare Providers:  SeriousBroker.it  This test is no t yet approved or cleared by the [place] FDA  and  has been authorized for detection and/or diagnosis of SARS-CoV-2 by FDA under an Emergency Use Authorization (EUA). This EUA will remain  in effect (meaning this test can be used) for the duration of the COVID-19 declaration under Section 564(b)(1) of the Act, 21 U.S.C.section 360bbb-3(b)(1), unless the authorization is terminated  or revoked sooner.       Influenza A by PCR NEGATIVE NEGATIVE Final   Influenza B by PCR NEGATIVE NEGATIVE Final    Comment: (NOTE) The Xpert Xpress SARS-CoV-2/FLU/RSV plus assay is intended as an aid in the diagnosis of influenza from Nasopharyngeal swab specimens and should not be used as a sole basis for treatment. Nasal washings and aspirates are unacceptable for Xpert Xpress SARS-CoV-2/FLU/RSV testing.  Fact Sheet for Patients: BloggerCourse.com  Fact Sheet for Healthcare Providers: SeriousBroker.it  This test is not yet approved or cleared by the [place] FDA and has been authorized for detection and/or diagnosis of SARS-CoV-2 by FDA under an Emergency Use Authorization (EUA). This EUA will remain in effect (meaning this test can be used) for the duration of the COVID-19 declaration under Section 564(b)(1) of the Act, 21 U.S.C. section 360bbb-3(b)(1), unless the authorization is terminated or revoked.     Resp Syncytial Virus by PCR NEGATIVE NEGATIVE Final    Comment: (NOTE) Fact Sheet for Patients: BloggerCourse.com  Fact Sheet for Healthcare Providers: SeriousBroker.it  This test is not yet approved or cleared by the [place] FDA and has been authorized for detection and/or diagnosis of SARS-CoV-2 by FDA under an Emergency Use Authorization (EUA). This EUA will remain in effect (meaning this test can be used) for the duration of the COVID-19 declaration under Section 564(b)(1) of the Act, 21 U.S.C. section 360bbb-3(b)(1),  unless the authorization is terminated or revoked.  Performed at [facility], [address] [place], [place] [address]   Resp panel by RT-PCR (RSV, Flu A&B, Covid) Anterior Nasal Swab     Status: Abnormal   Collection Time: 07/05/23  8:37 PM   Specimen: Anterior Nasal Swab  Result Value Ref Range Status   SARS Coronavirus 2 by RT PCR NEGATIVE NEGATIVE Final    Comment: (NOTE) SARS-CoV-2 target nucleic acids are NOT DETECTED.  The SARS-CoV-2 RNA is generally detectable in upper respiratory specimens during the acute phase of infection. The lowest concentration of SARS-CoV-2 viral copies this assay can detect is 138 copies/mL. A negative result does not preclude SARS-Cov-2 infection and should not be used as the sole basis for treatment or other patient management decisions. A negative result may occur with  improper specimen collection/handling, submission of specimen other than nasopharyngeal swab, presence of viral mutation(s) within the areas targeted by this assay, and inadequate number of viral copies(<138 copies/mL). A negative result must be combined with clinical observations, patient history, and epidemiological information. The expected result is Negative.  Fact Sheet for Patients:  BloggerCourse.com  Fact Sheet for Healthcare Providers:  SeriousBroker.it  This test is no t yet approved or cleared by the [place] FDA and  has been authorized for detection and/or diagnosis of SARS-CoV-2 by FDA under an Emergency Use Authorization (EUA). This EUA will remain  in effect (meaning this test can be used) for the duration of the COVID-19 declaration under Section 564(b)(1) of the Act, 21 U.S.C.section 360bbb-3(b)(1), unless the authorization is terminated  or revoked sooner.       Influenza A by PCR NEGATIVE NEGATIVE Final   Influenza B by PCR NEGATIVE NEGATIVE Final    Comment: (NOTE) The Xpert Xpress  SARS-CoV-2/FLU/RSV plus assay is intended as an aid in the diagnosis of influenza from Nasopharyngeal swab specimens and should not be used as a sole basis for treatment. Nasal washings and aspirates are unacceptable for Xpert Xpress SARS-CoV-2/FLU/RSV testing.  Fact Sheet for Patients: BloggerCourse.com  Fact Sheet for Healthcare Providers: SeriousBroker.it  This test is not yet approved or cleared by the [place] FDA and has been authorized for detection and/or diagnosis of SARS-CoV-2 by FDA under an Emergency Use Authorization (EUA). This EUA will remain in effect (meaning this test can be used) for the duration of the COVID-19 declaration under Section 564(b)(1) of the Act, 21 U.S.C. section 360bbb-3(b)(1), unless the authorization is terminated or revoked.     Resp Syncytial Virus by PCR POSITIVE (A) NEGATIVE Final    Comment: (NOTE) Fact Sheet for Patients: BloggerCourse.com  Fact Sheet for Healthcare Providers: SeriousBroker.it  This test is not yet approved or cleared by the [place] FDA and has been authorized for detection and/or diagnosis of SARS-CoV-2 by FDA under an Emergency Use Authorization (EUA). This EUA will remain in effect (meaning this test can be used) for the duration of the COVID-19 declaration under Section 564(b)(1) of the Act, 21 U.S.C. section 360bbb-3(b)(1), unless the authorization is terminated or revoked.  Performed at [facility], [address] [place], [place] [address]   Blood Culture (routine x 2)     Status: None (Preliminary result)   Collection Time: 07/05/23  9:24 PM   Specimen: BLOOD  Result Value Ref Range Status   Specimen Description BLOOD LEFT ARM  Final   Special Requests   Final    BOTTLES DRAWN AEROBIC AND ANAEROBIC Blood Culture results may not be optimal due to an inadequate volume of blood received in  culture bottles   Culture   Final    NO GROWTH < 12 HOURS Performed at [facility], [address], [place], [place] [address]    Report Status PENDING  Incomplete  Blood Culture (routine x 2)     Status: None (Preliminary result)   Collection Time: 07/05/23  9:24 PM   Specimen: BLOOD  Result Value Ref Range Status   Specimen Description BLOOD RIGHT ARM  Final   Special Requests   Final    BOTTLES DRAWN AEROBIC AND ANAEROBIC Blood Culture results may not be optimal due to an inadequate volume of blood received in culture bottles   Culture   Final    NO GROWTH < 12 HOURS Performed at [facility], [address], [place], [place] [address]    Report Status PENDING  Incomplete         Radiology Studies: DG Chest Port 1 View Result Date: 07/05/2023 CLINICAL DATA:  Shortness of breath. EXAM: PORTABLE CHEST 1 VIEW COMPARISON:  Chest radiograph dated 06/29/2023. FINDINGS: Left-sided Port-A-Cath with tip over  central SVC. Postsurgical changes of the right hilum and areas of scarring in the right lower lung field. Left mid to lower lung field reticulonodular densities slightly progressed since the prior radiograph and may represent developing infiltrate. No large pleural effusion. No pneumothorax. Stable cardiomediastinal silhouette. No acute osseous pathology. IMPRESSION: Slight progression of left mid to lower lung field reticulonodular densities may represent developing infiltrate. Electronically Signed   By: Elgie Collard M.D.   On: 07/05/2023 21:00        Scheduled Meds:  apixaban  2.5 mg Oral BID   cloNIDine  0.1 mg Oral TID   diltiazem  120 mg Oral Daily   feeding supplement  237 mL Oral TID BM   furosemide  40 mg Intravenous Daily   ipratropium-albuterol  3 mL Nebulization Q6H   latanoprost  1 drop Both Eyes QHS   magnesium chloride  1 tablet Oral Daily   methylPREDNISolone (SOLU-MEDROL) injection  40 mg Intravenous Q12H   Followed by   Melene Muller ON  07/07/2023] predniSONE  40 mg Oral Q breakfast   metoprolol succinate  25 mg Oral Daily   mometasone-formoterol  2 puff Inhalation BID   multivitamin with minerals  1 tablet Oral Daily   pantoprazole  40 mg Oral Daily   potassium chloride SA  20 mEq Oral Daily   Continuous Infusions:  azithromycin Stopped (07/05/23 2257)     LOS: 1 day     Tresa Moore, MD [facility]   If 7PM-7AM, please contact night-coverage  07/06/2023, 3:12 PM

## 2023-07-06 NOTE — ED Notes (Signed)
Pt sitting up in bed eating breakfast, denies any needs at this time.

## 2023-07-06 NOTE — ED Notes (Signed)
CCMD contacted, pt placed on cardiac monitoring.

## 2023-07-06 NOTE — Evaluation (Signed)
Physical Therapy Evaluation & discharge Patient Details Name: Samuel Choi MRN: [id number] DOB: 13-Oct-1965 Today's Date: 07/06/2023  History of Present Illness  Pt is a 57 y/o M admitted on 07/05/23 after presenting with ongoing SOB & progressive weakness. Pt is being treated for PNA 2/2 RSV. PMH: COPD on nocturnal O2 at 2L, diastolic CHF, severe pulmonary HTN, PE on chronic anticoagulation, NSCLC, HTN, malnutrition, alcohol use disorder  Clinical Impression  Pt seen for PT evaluation with pt agreeable to tx. Pt reports prior to admission he was independent without AD, denies falls, using supplemental O2 PRN.  On this date, pt is able to complete bed mobility, transfers, & gait without AD independently. Pt on 2L/min supplemental O2 throughout session, see below for vitals. Pt reports he is at baseline with mobility. At this time pt does not require acute PT services, will d/c current orders. Please re-consult if new needs arise.      If plan is discharge home, recommend the following:     Can travel by private vehicle        Equipment Recommendations None recommended by PT  Recommendations for Other Services       Functional Status Assessment Patient has not had a recent decline in their functional status     Precautions / Restrictions Precautions Precautions: None Restrictions Weight Bearing Restrictions Per Provider Order: No      Mobility  Bed Mobility Overal bed mobility: Modified Independent Bed Mobility: Supine to Sit     Supine to sit: Modified independent (Device/Increase time), HOB elevated          Transfers Overall transfer level: Independent                 General transfer comment: STS From stretcher without assistance or AD    Ambulation/Gait Ambulation/Gait assistance: Modified independent (Device/Increase time) Gait Distance (Feet): 150 Feet Assistive device: None Gait Pattern/deviations: Decreased stride length, Step-through  pattern Gait velocity: decreased     General Gait Details: 1 brief standing rest break 2/2 SOB, overall gait distance limited by SOB  Stairs            Wheelchair Mobility     Tilt Bed    Modified Rankin (Stroke Patients Only)       Balance Overall balance assessment: Needs assistance Sitting-balance support: Feet supported Sitting balance-Leahy Scale: Normal     Standing balance support: During functional activity, No upper extremity supported Standing balance-Leahy Scale: Good                               Pertinent Vitals/Pain Pain Assessment Pain Assessment: No/denies pain    Home Living Family/patient expects to be discharged to:: Private residence Living Arrangements: Spouse/significant other Available Help at Discharge: Family Type of Home: House Home Access: Stairs to enter Entrance Stairs-Rails: None Entrance Stairs-Number of Steps: 2   Home Layout: Two level;Able to live on main level with bedroom/bathroom Home Equipment: Agricultural consultant (2 wheels)      Prior Function               Mobility Comments: Ambulatory without AD, denies falls       Extremity/Trunk Assessment   Upper Extremity Assessment Upper Extremity Assessment: Overall WFL for tasks assessed    Lower Extremity Assessment Lower Extremity Assessment: Overall WFL for tasks assessed       Communication   Communication Communication: No apparent difficulties  Cognition Arousal: Alert  Behavior During Therapy: WFL for tasks assessed/performed Overall Cognitive Status: Within Functional Limits for tasks assessed                                          General Comments General comments (skin integrity, edema, etc.): Pt on 2L/min via nasal cannula, lowest SpO2 87% but pt able to recover to >/= 90% with pursed lip breathing & rest. Max HR 137 bpm. PT educated pt on pursed lip breathing, importance of upright sitting & continued mobility.     Exercises     Assessment/Plan    PT Assessment Patient does not need any further PT services  PT Problem List         PT Treatment Interventions      PT Goals (Current goals can be found in the Care Plan section)  Acute Rehab PT Goals Patient Stated Goal: get better PT Goal Formulation: With patient Time For Goal Achievement: 07/20/23 Potential to Achieve Goals: Good    Frequency       Co-evaluation               AM-PAC PT "6 Clicks" Mobility  Outcome Measure Help needed turning from your back to your side while in a flat bed without using bedrails?: None Help needed moving from lying on your back to sitting on the side of a flat bed without using bedrails?: None Help needed moving to and from a bed to a chair (including a wheelchair)?: None Help needed standing up from a chair using your arms (e.g., wheelchair or bedside chair)?: None Help needed to walk in hospital room?: None Help needed climbing 3-5 steps with a railing? : None 6 Click Score: 24    End of Session Equipment Utilized During Treatment: Oxygen Activity Tolerance: Patient tolerated treatment well Patient left: in bed;with call bell/phone within reach Nurse Communication: Mobility status      Time: 5784-6962 PT Time Calculation (min) (ACUTE ONLY): 11 min   Charges:   PT Evaluation $PT Eval Low Complexity: 1 Low   PT General Charges $$ ACUTE PT VISIT: 1 Visit         Aleda Grana, PT, DPT 07/06/23, 11:44 AM   Sandi Mariscal 07/06/2023, 11:42 AM

## 2023-07-07 DIAGNOSIS — J121 Respiratory syncytial virus pneumonia: Secondary | ICD-10-CM | POA: Diagnosis not present

## 2023-07-07 LAB — BASIC METABOLIC PANEL
Anion gap: 12 (ref 5–15)
BUN: 50 mg/dL — ABNORMAL HIGH (ref 6–20)
CO2: 28 mmol/L (ref 22–32)
Calcium: 8.3 mg/dL — ABNORMAL LOW (ref 8.9–10.3)
Chloride: 94 mmol/L — ABNORMAL LOW (ref 98–111)
Creatinine, Ser: 1.44 mg/dL — ABNORMAL HIGH (ref 0.61–1.24)
GFR, Estimated: 57 mL/min — ABNORMAL LOW (ref >60)
Glucose, Bld: 163 mg/dL — ABNORMAL HIGH (ref 70–99)
Potassium: 3.2 mmol/L — ABNORMAL LOW (ref 3.5–5.1)
Sodium: 134 mmol/L — ABNORMAL LOW (ref 135–145)

## 2023-07-07 MED ORDER — BENZONATATE 100 MG PO CAPS
200.0000 mg | ORAL_CAPSULE | Freq: Three times a day (TID) | ORAL | Status: DC
  Administered 2023-07-07 – 2023-07-09 (×7): 200 mg via ORAL
  Filled 2023-07-07 (×7): qty 2

## 2023-07-07 NOTE — Progress Notes (Signed)
PROGRESS NOTE    Samuel Choi  QQV:[id number] DOB: 10/22/1965 DOA: 07/05/2023 PCP: Barbette Reichmann, MD    Brief Narrative:  57 y.o. 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  He denies chest pain, lower extremity pain.  Does have lower extremity swelling.  In triage O2 sats were 88% and he had increased work of breathing.   Assessment & Plan:   Principal Problem:   Pneumonia due to respiratory syncytial virus (RSV) Active Problems:   Acute on chronic respiratory failure with hypoxia (HCC)   Severe pulmonary hypertension (HCC)   Acute on chronic diastolic CHF (congestive heart failure) (HCC)   COPD with acute exacerbation (HCC)   Severe tricuspid regurgitation by prior echocardiogram   Essential hypertension   Squamous cell carcinoma of right lung (HCC)   Lactic acidosis   History of DVT and PE   Chronic anticoagulation   Protein calorie malnutrition (HCC)   Hypokalemia   Alcohol use disorder   RSV (respiratory syncytial virus pneumonia)   Protein-calorie malnutrition, severe  Pneumonia due to respiratory syncytial virus (RSV) Negative procalcitonin.  Relatively low suspicion for bacterial coinfection at this time.  Suspect this is mainly viral in nature. Continue azithromycin 500 every day x 3 days.  Continue steroids and bronchodilators for suspected viral syndrome.   Acute on chronic respiratory failure with hypoxia (HCC) Multifactorial: RSV pneumonia with COPD exacerbation and mild CHF exacerbation with background severe pulmonary  hypertension with severe mitral regurg as well as lung cancer Low suspicion for PE as patient on chronic anticoagulation Continue supplemental oxygen and wean as tolerated to baseline O2   Acute on chronic diastolic CHF (congestive heart failure) (HCC) Severe tricuspid regurgitation on echo 12/24 Severe pulmonary hypertension EF 65 to 70% 06/30/2023 Hold lasix.  Volume status euvolemic   Lactic acidosis Possible sepsis Suspect lactic acidosis secondary to acute respiratory distress though sepsis a possibility Sepsis criteria will include temp of 99.5, tachycardia and tachypnea with leukocytosis and lactic acidosis Procalcitonin negative.  No IV fluids.     Squamous cell carcinoma of right lung (HCC) No acute issues suspected   Essential hypertension Cautiously continue metoprolol, diltiazem, clonidine   History of DVT and PE Chronic anticoagulation Continue apixaban   Protein calorie malnutrition (HCC) Consider dietary eval   Hypokalemia Replete and monitor   Alcohol use disorder Monitor for withdrawal     DVT prophylaxis: Apixaban Code Status: DNR Family Communication: Family member at bedside 12/28 Disposition Plan: Status is: Inpatient Remains inpatient appropriate because: Hypoxic respiratory failure in the setting of RSV infection   Level of care: Med-Surg  Consultants:  None  Procedures:  None  Antimicrobials: Azithromycin   Subjective: Seen and examined.  Sitting in bed.  SOB improved but not at baseline  Objective: Vitals:   07/07/23 0456 07/07/23 0500 07/07/23 0730 07/07/23 0739  BP: 128/83   122/79  Pulse: 99   99  Resp:      Temp: 98 F (36.7 C)   98.1 F (36.7 C)  TempSrc: Oral   Oral  SpO2: 92%  93% 95%  Weight:  57.1 kg  Height:        Intake/Output Summary (Last 24 hours) at 07/07/2023 1442 Last data filed at 07/07/2023 1142 Gross per 24 hour  Intake 840.61 ml  Output 1350 ml  Net -509.39 ml   Filed Weights   07/05/23  2027 07/06/23 1302 07/07/23 0500  Weight: 66.7 kg 60 kg 57.1 kg    Examination:  General exam: No acute distress.  Appears frail and chronically ill Respiratory system: Scattered crackles bilaterally.  Normal work of breathing.  2L Cardiovascular system: S1-S2, RRR, no murmurs, no pedal edema Gastrointestinal system: Thin, soft, NT/ND, normal bowel sounds Central nervous system: Alert and oriented. No focal neurological deficits. Extremities: Symmetric 5 x 5 power. Skin: No rashes, lesions or ulcers Psychiatry: Judgement and insight appear normal. Mood & affect appropriate.     Data Reviewed: I have personally reviewed following labs and imaging studies  CBC: Recent Labs  Lab 07/01/23 0445 07/05/23 2037 07/06/23 0505  WBC 18.9* 19.1* 15.7*  HGB 12.7* 14.9 13.3  HCT 36.8* 43.7 40.0  MCV 100.3* 102.3* 102.0*  PLT 122* 158 145*   Basic Metabolic Panel: Recent Labs  Lab 07/01/23 0445 07/05/23 2037 07/06/23 0505 07/07/23 0233  NA 134* 143 138 134*  K 3.1* 3.3* 3.5 3.2*  CL 90* 94* 92* 94*  CO2 32 30 29 28   GLUCOSE 95 108* 143* 163*  BUN 27* 28* 30* 50*  CREATININE 0.92 1.21 1.17 1.44*  CALCIUM 8.8* 9.8 8.9 8.3*  MG 1.6*  --   --   --   PHOS 2.6  --   --   --    GFR: Estimated Creatinine Clearance: 45.7 mL/min (A) (by C-G formula based on SCr of 1.44 mg/dL (H)). Liver Function Tests: Recent Labs  Lab 07/05/23 2037  AST 22  ALT 32  ALKPHOS 138*  BILITOT 1.4*  PROT 8.0  ALBUMIN 4.2   No results for input(s): "LIPASE", "AMYLASE" in the last 168 hours. No results for input(s): "AMMONIA" in the last 168 hours. Coagulation Profile: Recent Labs  Lab 07/05/23 2037  INR 1.2   Cardiac Enzymes: No results for input(s): "CKTOTAL", "CKMB", "CKMBINDEX", "TROPONINI" in the last 168 hours. BNP (last 3 results) No results for input(s): "PROBNP" in the last 8760 hours. HbA1C: No results for input(s): "HGBA1C" in the last 72 hours. CBG: No results for input(s):  "GLUCAP" in the last 168 hours.  Lipid Profile: No results for input(s): "CHOL", "HDL", "LDLCALC", "TRIG", "CHOLHDL", "LDLDIRECT" in the last 72 hours. Thyroid Function Tests: No results for input(s): "TSH", "T4TOTAL", "FREET4", "T3FREE", "THYROIDAB" in the last 72 hours. Anemia Panel: No results for input(s): "VITAMINB12", "FOLATE", "FERRITIN", "TIBC", "IRON", "RETICCTPCT" in the last 72 hours. Sepsis Labs: Recent Labs  Lab 07/05/23 2124 07/05/23 2303 07/05/23 2304  PROCALCITON  --  0.18  --   LATICACIDVEN 2.0*  --  1.2    Recent Results (from the past 240 hours)  Resp panel by RT-PCR (RSV, Flu A&B, Covid) Anterior Nasal Swab     Status: None   Collection Time: 06/29/23 11:19 AM   Specimen: Anterior Nasal Swab  Result Value Ref Range Status   SARS Coronavirus 2 by RT PCR NEGATIVE NEGATIVE Final    Comment: (NOTE) SARS-CoV-2 target nucleic acids are NOT DETECTED.  The SARS-CoV-2 RNA is generally detectable in upper respiratory specimens during the acute phase of infection. The lowest concentration of SARS-CoV-2 viral copies this assay can detect is 138 copies/mL. A negative result does not preclude SARS-Cov-2 infection  and should not be used as the sole basis for treatment or other patient management decisions. A negative result may occur with  improper specimen collection/handling, submission of specimen other than nasopharyngeal swab, presence of viral mutation(s) within the areas targeted by this assay, and inadequate number of viral copies(<138 copies/mL). A negative result must be combined with clinical observations, patient history, and epidemiological information. The expected result is Negative.  Fact Sheet for Patients:  BloggerCourse.com  Fact Sheet for Healthcare Providers:  SeriousBroker.it  This test is no t yet approved or cleared by the [place] FDA and  has been authorized for detection and/or  diagnosis of SARS-CoV-2 by FDA under an Emergency Use Authorization (EUA). This EUA will remain  in effect (meaning this test can be used) for the duration of the COVID-19 declaration under Section 564(b)(1) of the Act, 21 U.S.C.section 360bbb-3(b)(1), unless the authorization is terminated  or revoked sooner.       Influenza A by PCR NEGATIVE NEGATIVE Final   Influenza B by PCR NEGATIVE NEGATIVE Final    Comment: (NOTE) The Xpert Xpress SARS-CoV-2/FLU/RSV plus assay is intended as an aid in the diagnosis of influenza from Nasopharyngeal swab specimens and should not be used as a sole basis for treatment. Nasal washings and aspirates are unacceptable for Xpert Xpress SARS-CoV-2/FLU/RSV testing.  Fact Sheet for Patients: BloggerCourse.com  Fact Sheet for Healthcare Providers: SeriousBroker.it  This test is not yet approved or cleared by the [place] FDA and has been authorized for detection and/or diagnosis of SARS-CoV-2 by FDA under an Emergency Use Authorization (EUA). This EUA will remain in effect (meaning this test can be used) for the duration of the COVID-19 declaration under Section 564(b)(1) of the Act, 21 U.S.C. section 360bbb-3(b)(1), unless the authorization is terminated or revoked.     Resp Syncytial Virus by PCR NEGATIVE NEGATIVE Final    Comment: (NOTE) Fact Sheet for Patients: BloggerCourse.com  Fact Sheet for Healthcare Providers: SeriousBroker.it  This test is not yet approved or cleared by the [place] FDA and has been authorized for detection and/or diagnosis of SARS-CoV-2 by FDA under an Emergency Use Authorization (EUA). This EUA will remain in effect (meaning this test can be used) for the duration of the COVID-19 declaration under Section 564(b)(1) of the Act, 21 U.S.C. section 360bbb-3(b)(1), unless the authorization is terminated  or revoked.  Performed at [facility], [address] [place], [place] [address]   Resp panel by RT-PCR (RSV, Flu A&B, Covid) Anterior Nasal Swab     Status: Abnormal   Collection Time: 07/05/23  8:37 PM   Specimen: Anterior Nasal Swab  Result Value Ref Range Status   SARS Coronavirus 2 by RT PCR NEGATIVE NEGATIVE Final    Comment: (NOTE) SARS-CoV-2 target nucleic acids are NOT DETECTED.  The SARS-CoV-2 RNA is generally detectable in upper respiratory specimens during the acute phase of infection. The lowest concentration of SARS-CoV-2 viral copies this assay can detect is 138 copies/mL. A negative result does not preclude SARS-Cov-2 infection and should not be used as the sole basis for treatment or other patient management decisions. A negative result may occur with  improper specimen collection/handling, submission of specimen other than nasopharyngeal swab, presence of viral mutation(s) within the areas targeted by this assay, and inadequate number of viral copies(<138 copies/mL). A negative result must be combined with clinical observations, patient history, and epidemiological information. The expected result is Negative.  Fact Sheet for Patients:  BloggerCourse.com  Fact Sheet  for Healthcare Providers:  SeriousBroker.it  This test is no t yet approved or cleared by the [place] and  has been authorized for detection and/or diagnosis of SARS-CoV-2 by FDA under an Emergency Use Authorization (EUA). This EUA will remain  in effect (meaning this test can be used) for the duration of the COVID-19 declaration under Section 564(b)(1) of the Act, 21 U.S.C.section 360bbb-3(b)(1), unless the authorization is terminated  or revoked sooner.       Influenza A by PCR NEGATIVE NEGATIVE Final   Influenza B by PCR NEGATIVE NEGATIVE Final    Comment: (NOTE) The Xpert Xpress SARS-CoV-2/FLU/RSV plus assay is  intended as an aid in the diagnosis of influenza from Nasopharyngeal swab specimens and should not be used as a sole basis for treatment. Nasal washings and aspirates are unacceptable for Xpert Xpress SARS-CoV-2/FLU/RSV testing.  Fact Sheet for Patients: BloggerCourse.com  Fact Sheet for Healthcare Providers: SeriousBroker.it  This test is not yet approved or cleared by the [place] FDA and has been authorized for detection and/or diagnosis of SARS-CoV-2 by FDA under an Emergency Use Authorization (EUA). This EUA will remain in effect (meaning this test can be used) for the duration of the COVID-19 declaration under Section 564(b)(1) of the Act, 21 U.S.C. section 360bbb-3(b)(1), unless the authorization is terminated or revoked.     Resp Syncytial Virus by PCR POSITIVE (A) NEGATIVE Final    Comment: (NOTE) Fact Sheet for Patients: BloggerCourse.com  Fact Sheet for Healthcare Providers: SeriousBroker.it  This test is not yet approved or cleared by the [place] FDA and has been authorized for detection and/or diagnosis of SARS-CoV-2 by FDA under an Emergency Use Authorization (EUA). This EUA will remain in effect (meaning this test can be used) for the duration of the COVID-19 declaration under Section 564(b)(1) of the Act, 21 U.S.C. section 360bbb-3(b)(1), unless the authorization is terminated or revoked.  Performed at [facility], [address] [place], [place] [address]   Blood Culture (routine x 2)     Status: None (Preliminary result)   Collection Time: 07/05/23  9:24 PM   Specimen: BLOOD  Result Value Ref Range Status   Specimen Description BLOOD LEFT ARM  Final   Special Requests   Final    BOTTLES DRAWN AEROBIC AND ANAEROBIC Blood Culture results may not be optimal due to an inadequate volume of blood received in culture bottles   Culture    Final    NO GROWTH 2 DAYS Performed at [facility], [address], [place], [place] [address]    Report Status PENDING  Incomplete  Blood Culture (routine x 2)     Status: None (Preliminary result)   Collection Time: 07/05/23  9:24 PM   Specimen: BLOOD  Result Value Ref Range Status   Specimen Description BLOOD RIGHT ARM  Final   Special Requests   Final    BOTTLES DRAWN AEROBIC AND ANAEROBIC Blood Culture results may not be optimal due to an inadequate volume of blood received in culture bottles   Culture   Final    NO GROWTH 2 DAYS Performed at [facility], [address], [place], [place] [address]    Report Status PENDING  Incomplete         Radiology Studies: DG Chest Port 1 View Result Date: 07/05/2023 CLINICAL DATA:  Shortness of breath. EXAM: PORTABLE CHEST 1 VIEW COMPARISON:  Chest radiograph dated 06/29/2023. FINDINGS: Left-sided Port-A-Cath with tip over central SVC. Postsurgical changes  of the right hilum and areas of scarring in the right lower lung field. Left mid to lower lung field reticulonodular densities slightly progressed since the prior radiograph and may represent developing infiltrate. No large pleural effusion. No pneumothorax. Stable cardiomediastinal silhouette. No acute osseous pathology. IMPRESSION: Slight progression of left mid to lower lung field reticulonodular densities may represent developing infiltrate. Electronically Signed   By: Elgie Collard M.D.   On: 07/05/2023 21:00        Scheduled Meds:  apixaban  2.5 mg Oral BID   benzonatate  200 mg Oral TID   cloNIDine  0.1 mg Oral TID   diltiazem  120 mg Oral Daily   feeding supplement  237 mL Oral TID BM   ipratropium-albuterol  3 mL Nebulization Q6H   latanoprost  1 drop Both Eyes QHS   magnesium chloride  1 tablet Oral Daily   metoprolol succinate  25 mg Oral Daily   mometasone-formoterol  2 puff Inhalation BID   multivitamin with minerals  1 tablet Oral Daily    pantoprazole  40 mg Oral Daily   potassium chloride SA  20 mEq Oral Daily   predniSONE  40 mg Oral Q breakfast   Continuous Infusions:  azithromycin Stopped (07/06/23 2310)     LOS: 2 days     Tresa Moore, MD [facility]   If 7PM-7AM, please contact night-coverage  07/07/2023, 2:42 PM

## 2023-07-08 LAB — BASIC METABOLIC PANEL
Anion gap: 13 (ref 5–15)
BUN: 54 mg/dL — ABNORMAL HIGH (ref 6–20)
CO2: 30 mmol/L (ref 22–32)
Calcium: 8.8 mg/dL — ABNORMAL LOW (ref 8.9–10.3)
Chloride: 94 mmol/L — ABNORMAL LOW (ref 98–111)
Creatinine, Ser: 1.22 mg/dL (ref 0.61–1.24)
GFR, Estimated: >60 mL/min (ref >60)
Glucose, Bld: 130 mg/dL — ABNORMAL HIGH (ref 70–99)
Potassium: 3.5 mmol/L (ref 3.5–5.1)
Sodium: 137 mmol/L (ref 135–145)

## 2023-07-08 MED ORDER — ARFORMOTEROL TARTRATE 15 MCG/2ML IN NEBU
15.0000 ug | INHALATION_SOLUTION | Freq: Two times a day (BID) | RESPIRATORY_TRACT | Status: DC
  Administered 2023-07-08 – 2023-07-09 (×2): 15 ug via RESPIRATORY_TRACT
  Filled 2023-07-08 (×3): qty 2

## 2023-07-08 MED ORDER — FUROSEMIDE 10 MG/ML IJ SOLN
40.0000 mg | Freq: Once | INTRAMUSCULAR | Status: AC
Start: 2023-07-08 — End: 2023-07-08
  Administered 2023-07-08: 40 mg via INTRAVENOUS
  Filled 2023-07-08: qty 4

## 2023-07-08 NOTE — Plan of Care (Signed)

## 2023-07-08 NOTE — TOC CM/SW Note (Signed)
Transition of Care [facility]) - Inpatient Brief Assessment   Patient Details  Name: Samuel Choi MRN: [id number] Date of Birth: 1966/01/16  Transition of Care [facility]) CM/SW Contact:    Rodney Langton, RN Phone Number: 07/08/2023, 12:37 PM   Clinical Narrative:  Brief assessment done, PT evaluation completed, no PT follow up.  No TOC needs at this time.  Please place consult if needs arise.  Transition of Care Asessment: Insurance and Status: Insurance coverage has been reviewed Patient has primary care physician: Yes Home environment has been reviewed: Yes Prior level of function:: Independent Prior/Current Home Services: No current home services Social Drivers of Health Review: SDOH reviewed no interventions necessary Readmission risk has been reviewed: Yes Transition of care needs: no transition of care needs at this time

## 2023-07-09 ENCOUNTER — Encounter: Payer: Self-pay | Admitting: Oncology

## 2023-07-09 DIAGNOSIS — J121 Respiratory syncytial virus pneumonia: Secondary | ICD-10-CM | POA: Diagnosis not present

## 2023-07-09 MED ORDER — PREDNISONE 20 MG PO TABS: 40.0000 mg | ORAL_TABLET | Freq: Every day | ORAL | 0 refills | Status: AC

## 2023-07-09 MED ORDER — BENZONATATE 200 MG PO CAPS: 200.0000 mg | ORAL_CAPSULE | Freq: Three times a day (TID) | ORAL | 0 refills | Status: AC | PRN

## 2023-07-09 NOTE — Progress Notes (Addendum)
PROGRESS NOTE    ALDIS LAMORTE  ZOX:[id number] DOB: Oct 01, 1965 DOA: 07/05/2023 PCP: Barbette Reichmann, MD    Brief Narrative:  57 y.o. 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  He denies chest pain, lower extremity pain.  Does have lower extremity swelling.  In triage O2 sats were 88% and he had increased work of breathing.   Assessment & Plan:   Principal Problem:   Pneumonia due to respiratory syncytial virus (RSV) Active Problems:   Acute on chronic respiratory failure with hypoxia (HCC)   Severe pulmonary hypertension (HCC)   Acute on chronic diastolic CHF (congestive heart failure) (HCC)   COPD with acute exacerbation (HCC)   Severe tricuspid regurgitation by prior echocardiogram   Essential hypertension   Squamous cell carcinoma of right lung (HCC)   Lactic acidosis   History of DVT and PE   Chronic anticoagulation   Protein calorie malnutrition (HCC)   Hypokalemia   Alcohol use disorder   RSV (respiratory syncytial virus pneumonia)   Protein-calorie malnutrition, severe  Pneumonia due to respiratory syncytial virus (RSV) Negative procalcitonin.  Relatively low suspicion for bacterial coinfection at this time.  Suspect this is mainly viral in nature. Continue azithromycin 500 every day x 3 days.  Completed continue steroids and bronchodilators for suspected viral syndrome.   Acute on chronic respiratory failure with hypoxia (HCC) Multifactorial: RSV pneumonia with COPD exacerbation and mild CHF exacerbation with background severe  pulmonary hypertension with severe mitral regurg as well as lung cancer Low suspicion for PE as patient on chronic anticoagulation Continue supplemental oxygen and wean as tolerated to baseline O2 Approaching baseline level of oxygen   Acute on chronic diastolic CHF (congestive heart failure) (HCC) Severe tricuspid regurgitation on echo 12/24 Severe pulmonary hypertension EF 65 to 70% 06/30/2023 Hold lasix.  Volume status euvolemic Can resume as needed Lasix on discharge   Lactic acidosis Possible sepsis Suspect lactic acidosis secondary to acute respiratory distress though sepsis a possibility Sepsis criteria will include temp of 99.5, tachycardia and tachypnea with leukocytosis and lactic acidosis Procalcitonin negative.  No IV fluids.     Squamous cell carcinoma of right lung (HCC) No acute issues suspected   Essential hypertension Cautiously continue metoprolol, diltiazem, clonidine   History of DVT and PE Chronic anticoagulation Continue apixaban   Protein calorie malnutrition (HCC) Consider dietary eval   Hypokalemia Replete and monitor   Alcohol use disorder Monitor for withdrawal, none noted     DVT prophylaxis: Apixaban Code Status: DNR Family Communication: Family member at bedside 12/28 Disposition Plan: Status is: Inpatient Remains inpatient appropriate because: Hypoxic respiratory failure in the setting of RSV infection   Level of care: Med-Surg  Consultants:  None  Procedures:  None  Antimicrobials: Azithromycin   Subjective: Seen and Ament.  Sitting in bed.  Shortness of breath improved.  Not yet at baseline.  Objective: Vitals:   07/08/23 2058 07/09/23 0437 07/09/23 0723 07/09/23 0748  BP: (!) 130/95 (!) 132/96  (!) 148/93  Pulse: 97 92  (!) 101  Resp: 15 16  17   Temp: 98.3 F (36.8 C) 98.5 F (  36.9 C)  98.2 F (36.8 C)  TempSrc:      SpO2: 94% 96% 95% 95%  Weight:      Height:        Intake/Output Summary (Last 24 hours) at  07/09/2023 1009 Last data filed at 07/09/2023 0000 Gross per 24 hour  Intake 0 ml  Output 950 ml  Net -950 ml   Filed Weights   07/06/23 1302 07/07/23 0500 07/08/23 0500  Weight: 60 kg 57.1 kg 56.9 kg    Examination:  General exam: NAD.  Frail Respiratory system: Bibasilar crackles.  End expiratory wheeze.  Normal work of breathing.  3 L Cardiovascular system: S1-S2, RRR, no murmurs, no pedal edema Gastrointestinal system: Thin, soft, NT/ND, normal bowel sounds Central nervous system: Alert and oriented. No focal neurological deficits. Extremities: Symmetric 5 x 5 power. Skin: No rashes, lesions or ulcers Psychiatry: Judgement and insight appear normal. Mood & affect appropriate.     Data Reviewed: I have personally reviewed following labs and imaging studies  CBC: Recent Labs  Lab 07/05/23 2037 07/06/23 0505  WBC 19.1* 15.7*  HGB 14.9 13.3  HCT 43.7 40.0  MCV 102.3* 102.0*  PLT 158 145*   Basic Metabolic Panel: Recent Labs  Lab 07/05/23 2037 07/06/23 0505 07/07/23 0233 07/08/23 0351  NA 143 138 134* 137  K 3.3* 3.5 3.2* 3.5  CL 94* 92* 94* 94*  CO2 30 29 28 30   GLUCOSE 108* 143* 163* 130*  BUN 28* 30* 50* 54*  CREATININE 1.21 1.17 1.44* 1.22  CALCIUM 9.8 8.9 8.3* 8.8*   GFR: Estimated Creatinine Clearance: 53.8 mL/min (by C-G formula based on SCr of 1.22 mg/dL). Liver Function Tests: Recent Labs  Lab 07/05/23 2037  AST 22  ALT 32  ALKPHOS 138*  BILITOT 1.4*  PROT 8.0  ALBUMIN 4.2   No results for input(s): "LIPASE", "AMYLASE" in the last 168 hours. No results for input(s): "AMMONIA" in the last 168 hours. Coagulation Profile: Recent Labs  Lab 07/05/23 2037  INR 1.2   Cardiac Enzymes: No results for input(s): "CKTOTAL", "CKMB", "CKMBINDEX", "TROPONINI" in the last 168 hours. BNP (last 3 results) No results for input(s): "PROBNP" in the last 8760 hours. HbA1C: No results for input(s): "HGBA1C" in the last 72 hours. CBG: No results for  input(s): "GLUCAP" in the last 168 hours.  Lipid Profile: No results for input(s): "CHOL", "HDL", "LDLCALC", "TRIG", "CHOLHDL", "LDLDIRECT" in the last 72 hours. Thyroid Function Tests: No results for input(s): "TSH", "T4TOTAL", "FREET4", "T3FREE", "THYROIDAB" in the last 72 hours. Anemia Panel: No results for input(s): "VITAMINB12", "FOLATE", "FERRITIN", "TIBC", "IRON", "RETICCTPCT" in the last 72 hours. Sepsis Labs: Recent Labs  Lab 07/05/23 2124 07/05/23 2303 07/05/23 2304  PROCALCITON  --  0.18  --   LATICACIDVEN 2.0*  --  1.2    Recent Results (from the past 240 hours)  Resp panel by RT-PCR (RSV, Flu A&B, Covid) Anterior Nasal Swab     Status: None   Collection Time: 06/29/23 11:19 AM   Specimen: Anterior Nasal Swab  Result Value Ref Range Status   SARS Coronavirus 2 by RT PCR NEGATIVE NEGATIVE Final    Comment: (NOTE) SARS-CoV-2 target nucleic acids are NOT DETECTED.  The SARS-CoV-2 RNA is generally detectable in upper respiratory specimens during the acute phase of infection. The lowest concentration of SARS-CoV-2 viral copies this assay can detect is 138 copies/mL. A negative result does not preclude SARS-Cov-2 infection and should not be used as the sole basis  for treatment or other patient management decisions. A negative result may occur with  improper specimen collection/handling, submission of specimen other than nasopharyngeal swab, presence of viral mutation(s) within the areas targeted by this assay, and inadequate number of viral copies(<138 copies/mL). A negative result must be combined with clinical observations, patient history, and epidemiological information. The expected result is Negative.  Fact Sheet for Patients:  BloggerCourse.com  Fact Sheet for Healthcare Providers:  SeriousBroker.it  This test is no t yet approved or cleared by the [place] FDA and  has been authorized for detection  and/or diagnosis of SARS-CoV-2 by FDA under an Emergency Use Authorization (EUA). This EUA will remain  in effect (meaning this test can be used) for the duration of the COVID-19 declaration under Section 564(b)(1) of the Act, 21 U.S.C.section 360bbb-3(b)(1), unless the authorization is terminated  or revoked sooner.       Influenza A by PCR NEGATIVE NEGATIVE Final   Influenza B by PCR NEGATIVE NEGATIVE Final    Comment: (NOTE) The Xpert Xpress SARS-CoV-2/FLU/RSV plus assay is intended as an aid in the diagnosis of influenza from Nasopharyngeal swab specimens and should not be used as a sole basis for treatment. Nasal washings and aspirates are unacceptable for Xpert Xpress SARS-CoV-2/FLU/RSV testing.  Fact Sheet for Patients: BloggerCourse.com  Fact Sheet for Healthcare Providers: SeriousBroker.it  This test is not yet approved or cleared by the [place] FDA and has been authorized for detection and/or diagnosis of SARS-CoV-2 by FDA under an Emergency Use Authorization (EUA). This EUA will remain in effect (meaning this test can be used) for the duration of the COVID-19 declaration under Section 564(b)(1) of the Act, 21 U.S.C. section 360bbb-3(b)(1), unless the authorization is terminated or revoked.     Resp Syncytial Virus by PCR NEGATIVE NEGATIVE Final    Comment: (NOTE) Fact Sheet for Patients: BloggerCourse.com  Fact Sheet for Healthcare Providers: SeriousBroker.it  This test is not yet approved or cleared by the [place] FDA and has been authorized for detection and/or diagnosis of SARS-CoV-2 by FDA under an Emergency Use Authorization (EUA). This EUA will remain in effect (meaning this test can be used) for the duration of the COVID-19 declaration under Section 564(b)(1) of the Act, 21 U.S.C. section 360bbb-3(b)(1), unless the authorization is terminated  or revoked.  Performed at [facility], [address] [place], [place] [address]   Resp panel by RT-PCR (RSV, Flu A&B, Covid) Anterior Nasal Swab     Status: Abnormal   Collection Time: 07/05/23  8:37 PM   Specimen: Anterior Nasal Swab  Result Value Ref Range Status   SARS Coronavirus 2 by RT PCR NEGATIVE NEGATIVE Final    Comment: (NOTE) SARS-CoV-2 target nucleic acids are NOT DETECTED.  The SARS-CoV-2 RNA is generally detectable in upper respiratory specimens during the acute phase of infection. The lowest concentration of SARS-CoV-2 viral copies this assay can detect is 138 copies/mL. A negative result does not preclude SARS-Cov-2 infection and should not be used as the sole basis for treatment or other patient management decisions. A negative result may occur with  improper specimen collection/handling, submission of specimen other than nasopharyngeal swab, presence of viral mutation(s) within the areas targeted by this assay, and inadequate number of viral copies(<138 copies/mL). A negative result must be combined with clinical observations, patient history, and epidemiological information. The expected result is Negative.  Fact Sheet for Patients:  BloggerCourse.com  Fact Sheet for Healthcare Providers:  SeriousBroker.it  This test is  no t yet approved or cleared by the [place] and  has been authorized for detection and/or diagnosis of SARS-CoV-2 by FDA under an Emergency Use Authorization (EUA). This EUA will remain  in effect (meaning this test can be used) for the duration of the COVID-19 declaration under Section 564(b)(1) of the Act, 21 U.S.C.section 360bbb-3(b)(1), unless the authorization is terminated  or revoked sooner.       Influenza A by PCR NEGATIVE NEGATIVE Final   Influenza B by PCR NEGATIVE NEGATIVE Final    Comment: (NOTE) The Xpert Xpress SARS-CoV-2/FLU/RSV plus assay is  intended as an aid in the diagnosis of influenza from Nasopharyngeal swab specimens and should not be used as a sole basis for treatment. Nasal washings and aspirates are unacceptable for Xpert Xpress SARS-CoV-2/FLU/RSV testing.  Fact Sheet for Patients: BloggerCourse.com  Fact Sheet for Healthcare Providers: SeriousBroker.it  This test is not yet approved or cleared by the [place] FDA and has been authorized for detection and/or diagnosis of SARS-CoV-2 by FDA under an Emergency Use Authorization (EUA). This EUA will remain in effect (meaning this test can be used) for the duration of the COVID-19 declaration under Section 564(b)(1) of the Act, 21 U.S.C. section 360bbb-3(b)(1), unless the authorization is terminated or revoked.     Resp Syncytial Virus by PCR POSITIVE (A) NEGATIVE Final    Comment: (NOTE) Fact Sheet for Patients: BloggerCourse.com  Fact Sheet for Healthcare Providers: SeriousBroker.it  This test is not yet approved or cleared by the [place] FDA and has been authorized for detection and/or diagnosis of SARS-CoV-2 by FDA under an Emergency Use Authorization (EUA). This EUA will remain in effect (meaning this test can be used) for the duration of the COVID-19 declaration under Section 564(b)(1) of the Act, 21 U.S.C. section 360bbb-3(b)(1), unless the authorization is terminated or revoked.  Performed at [facility], [address] [place], [place] [address]   Blood Culture (routine x 2)     Status: None (Preliminary result)   Collection Time: 07/05/23  9:24 PM   Specimen: BLOOD  Result Value Ref Range Status   Specimen Description BLOOD LEFT ARM  Final   Special Requests   Final    BOTTLES DRAWN AEROBIC AND ANAEROBIC Blood Culture results may not be optimal due to an inadequate volume of blood received in culture bottles   Culture    Final    NO GROWTH 4 DAYS Performed at [facility], [address], [place], [place] [address]    Report Status PENDING  Incomplete  Blood Culture (routine x 2)     Status: None (Preliminary result)   Collection Time: 07/05/23  9:24 PM   Specimen: BLOOD  Result Value Ref Range Status   Specimen Description BLOOD RIGHT ARM  Final   Special Requests   Final    BOTTLES DRAWN AEROBIC AND ANAEROBIC Blood Culture results may not be optimal due to an inadequate volume of blood received in culture bottles   Culture   Final    NO GROWTH 4 DAYS Performed at [facility], [address] [place], [place] [address]    Report Status PENDING  Incomplete         Radiology Studies: No results found.       Scheduled Meds:  apixaban  2.5 mg Oral BID   arformoterol  15 mcg Nebulization BID   benzonatate  200 mg Oral TID   cloNIDine  0.1 mg Oral TID  diltiazem  120 mg Oral Daily   feeding supplement  237 mL Oral TID BM   ipratropium-albuterol  3 mL Nebulization Q6H   latanoprost  1 drop Both Eyes QHS   magnesium chloride  1 tablet Oral Daily   metoprolol succinate  25 mg Oral Daily   multivitamin with minerals  1 tablet Oral Daily   pantoprazole  40 mg Oral Daily   potassium chloride SA  20 mEq Oral Daily   predniSONE  40 mg Oral Q breakfast   Continuous Infusions:  azithromycin 500 mg (07/08/23 2240)     LOS: 4 days     Tresa Moore, MD [facility]   If 7PM-7AM, please contact night-coverage  07/09/2023, 10:09 AM

## 2023-07-09 NOTE — Discharge Summary (Signed)
Physician Discharge Summary  Samuel Choi YQM:[id number] DOB: 11-Apr-1966 DOA: 07/05/2023  PCP: Barbette Reichmann, MD  Admit date: 07/05/2023 Discharge date: 07/09/2023  Admitted From: Home Disposition:  Home  Recommendations for Outpatient Follow-up:  Follow up with PCP in 1-2 weeks   Home Health:No Equipment/Devices: Oxygen 2 L via nasal cannula (chronic)  Discharge Condition:Stable CODE STATUS: DNR Diet recommendation: Regular  Brief/Interim Summary:  57 y.o. 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  He denies chest pain, lower extremity pain.  Does have lower extremity swelling.  In triage O2 sats were 88% and he had increased work of breathing.     Discharge Diagnoses:  Principal Problem:   Pneumonia due to respiratory syncytial virus (RSV) Active Problems:   Acute on chronic respiratory failure with hypoxia (HCC)   Severe pulmonary hypertension (HCC)   Acute on chronic diastolic CHF (congestive heart failure) (HCC)   COPD with acute exacerbation (HCC)   Severe tricuspid regurgitation by prior echocardiogram   Essential hypertension   Squamous cell carcinoma of right lung (HCC)   Lactic acidosis   History of DVT and PE   Chronic anticoagulation   Protein calorie malnutrition (HCC)   Hypokalemia   Alcohol use disorder   RSV (respiratory syncytial virus pneumonia)   Protein-calorie malnutrition, severe Pneumonia due to respiratory syncytial virus (RSV) Negative procalcitonin.  Relatively low suspicion for bacterial coinfection  at this time.  Suspect this is mainly viral in nature.  Completed 3 days of azithromycin.  Continue steroids and bronchodilators on discharge.   Acute on chronic respiratory failure with hypoxia (HCC) Multifactorial: RSV pneumonia with COPD exacerbation and mild CHF exacerbation with background severe pulmonary hypertension with severe mitral regurg as well as lung cancer Low suspicion for PE as patient on chronic anticoagulation On baseline oxygen at time of discharge   Acute on chronic diastolic CHF (congestive heart failure) (HCC) Severe tricuspid regurgitation on echo 12/24 Severe pulmonary hypertension EF 65 to 70% 06/30/2023 Resume as needed Lasix per home regimen   Lactic acidosis Possible sepsis Suspect lactic acidosis secondary to acute respiratory distress though sepsis a possibility Sepsis criteria will include temp of 99.5, tachycardia and tachypnea with leukocytosis and lactic acidosis    Squamous cell carcinoma of right lung (HCC) No acute issues suspected   Essential hypertension Cautiously continue metoprolol, diltiazem, clonidine   History of DVT and PE Chronic anticoagulation Continue apixaban     Discharge Instructions  Discharge Instructions     Diet - low sodium heart healthy   Complete by: As directed    Increase activity slowly   Complete by: As directed       Allergies as of 07/09/2023   No Known Allergies      Medication List     TAKE these medications    albuterol 108 (90 Base) MCG/ACT inhaler Commonly known as: VENTOLIN HFA INHALE 2 PUFFS BY MOUTH EVERY 6 HOURS AS NEEDED FOR WHEEZING OR SHORTNESS OF BREATH   albuterol (2.5 MG/3ML) 0.083% nebulizer solution Commonly known as: PROVENTIL Take 3 mLs (2.5 mg total) by nebulization every 6 (six) hours as needed  for wheezing or shortness of breath.   apixaban 2.5 MG Tabs tablet Commonly known as: Eliquis Take 1 tablet (2.5 mg total) by mouth 2 (two) times daily.   benzonatate 200 MG  capsule Commonly known as: TESSALON Take 1 capsule (200 mg total) by mouth 3 (three) times daily as needed for cough.   Breztri Aerosphere 160-9-4.8 MCG/ACT Aero Generic drug: Budeson-Glycopyrrol-Formoterol Inhale 2 puffs into the lungs in the morning and at bedtime.   cloNIDine 0.1 MG tablet Commonly known as: CATAPRES Take 1 tablet (0.1 mg total) by mouth 3 (three) times daily.   cyanocobalamin 1000 MCG tablet Commonly known as: VITAMIN B12 Take 1 tablet (1,000 mcg total) by mouth daily.   diltiazem 120 MG 24 hr capsule Commonly known as: CARDIZEM CD Take 120 mg by mouth daily.   feeding supplement Liqd Take 237 mLs by mouth 3 (three) times daily between meals.   furosemide 40 MG tablet Commonly known as: Lasix Take 1 tablet (40 mg total) by mouth daily as needed for fluid or edema.   latanoprost 0.005 % ophthalmic solution Commonly known as: XALATAN Place 1 drop into both eyes at bedtime.   loratadine 10 MG tablet Commonly known as: CLARITIN TAKE 1 TABLET(10 MG) BY MOUTH DAILY AS NEEDED FOR ALLERGIES   magnesium chloride 64 MG Tbec SR tablet Commonly known as: SLOW-MAG Take 1 tablet (64 mg total) by mouth daily.   metoprolol succinate 25 MG 24 hr tablet Commonly known as: TOPROL-XL Take 1 tablet (25 mg total) by mouth daily.   multivitamin with minerals Tabs tablet Take 1 tablet by mouth daily.   pantoprazole 20 MG tablet Commonly known as: PROTONIX TAKE 1 TABLET BY MOUTH DAILY   potassium chloride SA 20 MEQ tablet Commonly known as: KLOR-CON M Take 1 tablet (20 mEq total) by mouth daily.   predniSONE 20 MG tablet Commonly known as: DELTASONE Take 2 tablets (40 mg total) by mouth daily with breakfast for 2 days. Start taking on: July 10, 2023        Follow-up Information     Barbette Reichmann, MD Follow up.   Specialty: Internal Medicine Why: Hospital follow up Contact information: [address] [place]  [address] [phone]                No Known Allergies  Consultations: None   Procedures/Studies: DG Chest Port 1 View Result Date: 07/05/2023 CLINICAL DATA:  Shortness of breath. EXAM: PORTABLE CHEST 1 VIEW COMPARISON:  Chest radiograph dated 06/29/2023. FINDINGS: Left-sided Port-A-Cath with tip over central SVC. Postsurgical changes of the right hilum and areas of scarring in the right lower lung field. Left mid to lower lung field reticulonodular densities slightly progressed since the prior radiograph and may represent developing infiltrate. No large pleural effusion. No pneumothorax. Stable cardiomediastinal silhouette. No acute osseous pathology. IMPRESSION: Slight progression of left mid to lower lung field reticulonodular densities may represent developing infiltrate. Electronically Signed   By: Elgie Collard M.D.   On: 07/05/2023 21:00   ECHOCARDIOGRAM COMPLETE Result Date: 06/30/2023    ECHOCARDIOGRAM REPORT   Patient Name:   DENZEL HAWES Date of Exam: 06/30/2023 Medical Rec #:  [id number]      Height:       73.0 in Accession #:    [id number]     Weight:       147.3 lb Date of Birth:  [birth year]       BSA:  1.889 m Patient Age:    57 years       BP:           162/106 mmHg Patient Gender: M              HR:           97 bpm. Exam Location:  [facility] Procedure: 2D Echo Indications:     CHF I50.31  History:         Patient has prior history of Echocardiogram examinations, most                  recent 02/04/2022.  Sonographer:     Overton Mam RDCS, FASE Referring Phys:  [id number] Emeline General Diagnosing Phys: Windell Norfolk IMPRESSIONS  1. Left ventricular ejection fraction, by estimation, is 65 to 70%. The left ventricle has normal function. The left ventricle has no regional wall motion abnormalities. There is moderate concentric left ventricular hypertrophy. Left ventricular diastolic parameters are indeterminate. There is the interventricular septum is flattened in systole  and diastole, consistent with right ventricular pressure and volume overload.  2. Right ventricular systolic function is mildly reduced. The right ventricular size is moderately enlarged. There is severely elevated pulmonary artery systolic pressure. The estimated right ventricular systolic pressure is 79.0 mmHg.  3. Right atrial size was severely dilated.  4. A small pericardial effusion is present. The pericardial effusion is circumferential. There is no evidence of cardiac tamponade.  5. The mitral valve is normal in structure. Trivial mitral valve regurgitation.  6. Tricuspid valve regurgitation is severe.  7. The aortic valve is tricuspid. Aortic valve regurgitation is not visualized.  8. The inferior vena cava is dilated in size with <50% respiratory variability, suggesting right atrial pressure of 15 mmHg. FINDINGS  Left Ventricle: Left ventricular ejection fraction, by estimation, is 65 to 70%. The left ventricle has normal function. The left ventricle has no regional wall motion abnormalities. The left ventricular internal cavity size was small. There is moderate  concentric left ventricular hypertrophy. The interventricular septum is flattened in systole and diastole, consistent with right ventricular pressure and volume overload. Left ventricular diastolic parameters are indeterminate. Right Ventricle: The right ventricular size is moderately enlarged. No increase in right ventricular wall thickness. Right ventricular systolic function is mildly reduced. There is severely elevated pulmonary artery systolic pressure. The tricuspid regurgitant velocity is 4.00 m/s, and with an assumed right atrial pressure of 15 mmHg, the estimated right ventricular systolic pressure is 79.0 mmHg. Left Atrium: Left atrial size was normal in size. Right Atrium: Right atrial size was severely dilated. Pericardium: A small pericardial effusion is present. The pericardial effusion is circumferential. There is no evidence of  cardiac tamponade. Mitral Valve: The mitral valve is normal in structure. Trivial mitral valve regurgitation. Tricuspid Valve: The tricuspid valve is normal in structure. Tricuspid valve regurgitation is severe. Aortic Valve: The aortic valve is tricuspid. Aortic valve regurgitation is not visualized. Aortic valve peak gradient measures 6.5 mmHg. Pulmonic Valve: The pulmonic valve was not well visualized. Pulmonic valve regurgitation is trivial. Aorta: The aortic root and ascending aorta are structurally normal, with no evidence of dilitation. Venous: The inferior vena cava is dilated in size with less than 50% respiratory variability, suggesting right atrial pressure of 15 mmHg. IAS/Shunts: The atrial septum is grossly normal.  LEFT VENTRICLE PLAX 2D LVIDd:         3.40 cm   Diastology LVIDs:  2.40 cm   LV e' medial:    8.86 cm/s LV PW:         1.50 cm   LV E/e' medial:  7.8 LV IVS:        1.40 cm   LV e' lateral:   10.38 cm/s LVOT diam:     1.80 cm   LV E/e' lateral: 6.7 LV SV:         48 LV SV Index:   25 LVOT Area:     2.54 cm  RIGHT VENTRICLE RV Basal diam:  4.30 cm LEFT ATRIUM           Index       RIGHT ATRIUM           Index LA diam:      3.10 cm 1.64 cm/m  RA Area:     27.50 cm LA Vol (A4C): 11.5 ml 6.09 ml/m  RA Volume:   103.00 ml 54.52 ml/m  AORTIC VALVE                 PULMONIC VALVE AV Area (Vmax): 2.50 cm     PV Vmax:       0.99 m/s AV Vmax:        127.00 cm/s  PV Peak grad:  4.0 mmHg AV Peak Grad:   6.5 mmHg LVOT Vmax:      125.00 cm/s LVOT Vmean:     72.600 cm/s LVOT VTI:       0.187 m                              PULMONARY ARTERY AORTA                        MPA diam:        2.50 cm Ao Root diam: 3.80 cm Ao Asc diam:  3.10 cm MITRAL VALVE                TRICUSPID VALVE MV Area (PHT): 6.17 cm     TV Peak grad:   56.2 mmHg MV Decel Time: 123 msec     TV Vmax:        3.75 m/s MV E velocity: 69.45 cm/s   TR Peak grad:   64.0 mmHg MV A velocity: 103.85 cm/s  TR Vmax:        400.00 cm/s MV  E/A ratio:  0.67                             SHUNTS                             Systemic VTI:  0.19 m                             Systemic Diam: 1.80 cm Windell Norfolk Electronically signed by Windell Norfolk Signature Date/Time: 06/30/2023/1:32:09 PM    Final    DG Chest 2 View Result Date: 06/29/2023 CLINICAL DATA:  Shortness of breath for 2 weeks. EXAM: CHEST - 2 VIEW COMPARISON:  X-ray and CT angiogram 06/14/2023. FINDINGS: Stable left subclavian chest port with the tip along the central SVC. Persistent masslike thickening and distortion right perihilar as well as a small right pleural effusion. Left lung is grossly  clear. Chronic lung changes. No pneumothorax. Enlarged cardiopericardial silhouette. Overlapping cardiac leads. Film is under penetrated. IMPRESSION: No significant interval change when adjusting for technique. Electronically Signed   By: Karen Kays M.D.   On: 06/29/2023 13:17   US ABDOMEN LIMITED RUQ (LIVER/GB) Result Date: 06/29/2023 CLINICAL DATA:  Swelling EXAM: ULTRASOUND ABDOMEN LIMITED RIGHT UPPER QUADRANT COMPARISON:  CT 02/23/2023. FINDINGS: Gallbladder: Distended gallbladder with significant wall thickening up to 10 mm. Wall edema identified. No obvious stones. No reported sonographic Murphy's sign. Common bile duct: Diameter: 2 mm Liver: No focal lesion identified. Within normal limits in parenchymal echogenicity. Portal vein is patent on color Doppler imaging with normal direction of blood flow towards the liver. Other: None. IMPRESSION: Dilated gallbladder without stones however there is severe gallbladder wall thickening of the 10 mm and some wall edema. This is of uncertain etiology. Further workup as clinically appropriate. No ductal dilatation. Electronically Signed   By: Karen Kays M.D.   On: 06/29/2023 13:15   CT Angio Chest Pulmonary Embolism (PE) W or WO Contrast Result Date: 06/14/2023 CLINICAL DATA:  History of DVTs with shortness of breath, congestion, and  wheezing EXAM: CT ANGIOGRAPHY CHEST WITH CONTRAST TECHNIQUE: Multidetector CT imaging of the chest was performed using the standard protocol during bolus administration of intravenous contrast. Multiplanar CT image reconstructions and MIPs were obtained to evaluate the vascular anatomy. RADIATION DOSE REDUCTION: This exam was performed according to the departmental dose-optimization program which includes automated exposure control, adjustment of the mA and/or kV according to patient size and/or use of iterative reconstruction technique. CONTRAST:  75mL OMNIPAQUE IOHEXOL 350 MG/ML SOLN COMPARISON:  CTA chest dated 02/23/2023, same day chest radiograph FINDINGS: Cardiovascular: Left chest wall port terminates at the lower SVC. Irregular enhancement at the tip of the catheter (4:70). The study is high quality for the evaluation of pulmonary embolism. There are no filling defects in the central, lobar, segmental or subsegmental pulmonary artery branches to suggest acute pulmonary embolism. Main pulmonary artery measures 3.4 cm. Normal heart size. No significant pericardial fluid/thickening. Reflux of contrast material into the hepatic veins, suggesting a degree of right heart dysfunction. Coronary artery calcifications and aortic atherosclerosis. Mediastinum/Nodes: Similar rightward deviation of the mediastinum. Imaged thyroid gland without nodules meeting criteria for imaging follow-up by size. Mildly patulous esophagus. Increased size of 11 mm prevascular lymph node (4:56), previously 8 mm. Lungs/Pleura: The central airways are patent. Status post right upper lobectomy. Changes of centrilobular emphysema. Similar appearance of band-like consolidation involving the medial right middle and lower lobes with volume loss and traction bronchiectasis and bronchiolectasis in keeping with radiation changes. There is increased thickening along the major fissure. New irregular consolidation in the right middle lobe and new  mural nodule along the posterior right lower lobe cystic bronchiectasis measuring 1.5 x 0.9 cm (5:85). Increased conspicuity of previously noted nodular scarring adjacent to the cystic bronchiectasis measuring 1.2 x 0.5 cm (5:83). Increased left lung mosaic attenuation. Diffuse interlobular septal thickening. Persistent ill-defined micronodularity diffusely. No pneumothorax. Similar small right pleural effusion and thickening. Upper abdomen: Normal. Musculoskeletal: No acute or abnormal lytic or blastic osseous lesions. Review of the MIP images confirms the above findings. IMPRESSION: 1. No evidence of pulmonary embolism. 2. New irregular consolidation in the right middle lobe and new mural nodule along the posterior right lower lobe cystic bronchiectasis measuring 1.5 x 0.9 cm. Increased conspicuity of previously noted nodular scarring adjacent to the cystic bronchiectasis measuring 1.2 x 0.5 cm. Findings are favored to  represent infection/inflammation. 3. Increased size of 11 mm prevascular lymph node, likely reactive. 4. Increased left lung mosaic attenuation and interlobular septal thickening, likely pulmonary edema in the setting of right heart dysfunction and pulmonary arterial hypertension. Increased small pericardial effusion. 5. Persistent ill-defined micronodularity diffusely, which may be seen in the setting of respiratory bronchiolitis. 6. Irregular enhancement at the tip of the left chest wall port catheter, which may be related to contrast bolus timing. Recommend correlation with port function. 7. Aortic Atherosclerosis (ICD10-I70.0) and Emphysema (ICD10-J43.9). Electronically Signed   By: Agustin Cree M.D.   On: 06/14/2023 12:03   US Venous Img Lower Unilateral Left Result Date: 06/14/2023 CLINICAL DATA:  Left leg swelling EXAM: Left LOWER EXTREMITY VENOUS DOPPLER ULTRASOUND TECHNIQUE: Gray-scale sonography with compression, as well as color and duplex ultrasound, were performed to evaluate the deep  venous system(s) from the level of the common femoral vein through the popliteal and proximal calf veins. COMPARISON:  None Available. FINDINGS: VENOUS Normal compressibility of the common femoral, superficial femoral, and popliteal veins, as well as the visualized calf veins. Visualized portions of profunda femoral vein and great saphenous vein unremarkable. No filling defects to suggest DVT on grayscale or color Doppler imaging. Doppler waveforms show normal direction of venous flow, normal respiratory plasticity and response to augmentation. Limited views of the contralateral common femoral vein are unremarkable. OTHER None. Limitations: none IMPRESSION: No evidence of left lower extremity DVT. Electronically Signed   By: Karen Kays M.D.   On: 06/14/2023 10:42   DG Chest 2 View Result Date: 06/14/2023 CLINICAL DATA:  One-week history of cough, congestion, and shortness of breath EXAM: CHEST - 2 VIEW COMPARISON:  Chest radiograph dated 02/10/2022 FINDINGS: Lines/tubes: Left chest wall port tip projects over the superior cavoatrial junction. Lungs: Similar appearance of right perihilar radiation fibrosis. Mild diffuse patchy and interstitial left lung opacities. Pleura: Blunting of the right costophrenic angle.  No pneumothorax. Heart/mediastinum: Similar  cardiomediastinal silhouette. Bones: No acute osseous abnormality. IMPRESSION: 1. Mild diffuse patchy and interstitial left lung opacities, which may represent atypical infection or pulmonary edema. 2. Similar appearance of right perihilar radiation fibrosis. 3. Blunting of the right costophrenic angle, which may represent a small pleural effusion or scarring. Electronically Signed   By: Agustin Cree M.D.   On: 06/14/2023 10:05      Subjective: Seen and examined on the day of discharge.  Back to baseline.  Lung sounds improved.  Stable for discharge home.  Discharge Exam: Vitals:   07/09/23 0723 07/09/23 0748  BP:  (!) 148/93  Pulse:  (!) 101   Resp:  17  Temp:  98.2 F (36.8 C)  SpO2: 95% 95%   Vitals:   07/08/23 2058 07/09/23 0437 07/09/23 0723 07/09/23 0748  BP: (!) 130/95 (!) 132/96  (!) 148/93  Pulse: 97 92  (!) 101  Resp: 15 16  17   Temp: 98.3 F (36.8 C) 98.5 F (36.9 C)  98.2 F (36.8 C)  TempSrc:      SpO2: 94% 96% 95% 95%  Weight:      Height:        General: Pt is alert, awake, not in acute distress Cardiovascular: RRR, S1/S2 +, no rubs, no gallops Respiratory: CTA bilaterally, no wheezing, no rhonchi Abdominal: Soft, NT, ND, bowel sounds + Extremities: no edema, no cyanosis    The results of significant diagnostics from this hospitalization (including imaging, microbiology, ancillary and laboratory) are listed below for reference.  Microbiology: Recent Results (from the past 240 hours)  Resp panel by RT-PCR (RSV, Flu A&B, Covid) Anterior Nasal Swab     Status: None   Collection Time: 06/29/23 11:19 AM   Specimen: Anterior Nasal Swab  Result Value Ref Range Status   SARS Coronavirus 2 by RT PCR NEGATIVE NEGATIVE Final    Comment: (NOTE) SARS-CoV-2 target nucleic acids are NOT DETECTED.  The SARS-CoV-2 RNA is generally detectable in upper respiratory specimens during the acute phase of infection. The lowest concentration of SARS-CoV-2 viral copies this assay can detect is 138 copies/mL. A negative result does not preclude SARS-Cov-2 infection and should not be used as the sole basis for treatment or other patient management decisions. A negative result may occur with  improper specimen collection/handling, submission of specimen other than nasopharyngeal swab, presence of viral mutation(s) within the areas targeted by this assay, and inadequate number of viral copies(<138 copies/mL). A negative result must be combined with clinical observations, patient history, and epidemiological information. The expected result is Negative.  Fact Sheet for Patients:   BloggerCourse.com  Fact Sheet for Healthcare Providers:  SeriousBroker.it  This test is no t yet approved or cleared by the [place] FDA and  has been authorized for detection and/or diagnosis of SARS-CoV-2 by FDA under an Emergency Use Authorization (EUA). This EUA will remain  in effect (meaning this test can be used) for the duration of the COVID-19 declaration under Section 564(b)(1) of the Act, 21 U.S.C.section 360bbb-3(b)(1), unless the authorization is terminated  or revoked sooner.       Influenza A by PCR NEGATIVE NEGATIVE Final   Influenza B by PCR NEGATIVE NEGATIVE Final    Comment: (NOTE) The Xpert Xpress SARS-CoV-2/FLU/RSV plus assay is intended as an aid in the diagnosis of influenza from Nasopharyngeal swab specimens and should not be used as a sole basis for treatment. Nasal washings and aspirates are unacceptable for Xpert Xpress SARS-CoV-2/FLU/RSV testing.  Fact Sheet for Patients: BloggerCourse.com  Fact Sheet for Healthcare Providers: SeriousBroker.it  This test is not yet approved or cleared by the [place] FDA and has been authorized for detection and/or diagnosis of SARS-CoV-2 by FDA under an Emergency Use Authorization (EUA). This EUA will remain in effect (meaning this test can be used) for the duration of the COVID-19 declaration under Section 564(b)(1) of the Act, 21 U.S.C. section 360bbb-3(b)(1), unless the authorization is terminated or revoked.     Resp Syncytial Virus by PCR NEGATIVE NEGATIVE Final    Comment: (NOTE) Fact Sheet for Patients: BloggerCourse.com  Fact Sheet for Healthcare Providers: SeriousBroker.it  This test is not yet approved or cleared by the [place] FDA and has been authorized for detection and/or diagnosis of SARS-CoV-2 by FDA under an Emergency Use  Authorization (EUA). This EUA will remain in effect (meaning this test can be used) for the duration of the COVID-19 declaration under Section 564(b)(1) of the Act, 21 U.S.C. section 360bbb-3(b)(1), unless the authorization is terminated or revoked.  Performed at [facility], [address] [place], [place] [address]   Resp panel by RT-PCR (RSV, Flu A&B, Covid) Anterior Nasal Swab     Status: Abnormal   Collection Time: 07/05/23  8:37 PM   Specimen: Anterior Nasal Swab  Result Value Ref Range Status   SARS Coronavirus 2 by RT PCR NEGATIVE NEGATIVE Final    Comment: (NOTE) SARS-CoV-2 target nucleic acids are NOT DETECTED.  The SARS-CoV-2 RNA is generally detectable in upper respiratory specimens during  the acute phase of infection. The lowest concentration of SARS-CoV-2 viral copies this assay can detect is 138 copies/mL. A negative result does not preclude SARS-Cov-2 infection and should not be used as the sole basis for treatment or other patient management decisions. A negative result may occur with  improper specimen collection/handling, submission of specimen other than nasopharyngeal swab, presence of viral mutation(s) within the areas targeted by this assay, and inadequate number of viral copies(<138 copies/mL). A negative result must be combined with clinical observations, patient history, and epidemiological information. The expected result is Negative.  Fact Sheet for Patients:  BloggerCourse.com  Fact Sheet for Healthcare Providers:  SeriousBroker.it  This test is no t yet approved or cleared by the [place] FDA and  has been authorized for detection and/or diagnosis of SARS-CoV-2 by FDA under an Emergency Use Authorization (EUA). This EUA will remain  in effect (meaning this test can be used) for the duration of the COVID-19 declaration under Section 564(b)(1) of the Act, 21 U.S.C.section  360bbb-3(b)(1), unless the authorization is terminated  or revoked sooner.       Influenza A by PCR NEGATIVE NEGATIVE Final   Influenza B by PCR NEGATIVE NEGATIVE Final    Comment: (NOTE) The Xpert Xpress SARS-CoV-2/FLU/RSV plus assay is intended as an aid in the diagnosis of influenza from Nasopharyngeal swab specimens and should not be used as a sole basis for treatment. Nasal washings and aspirates are unacceptable for Xpert Xpress SARS-CoV-2/FLU/RSV testing.  Fact Sheet for Patients: BloggerCourse.com  Fact Sheet for Healthcare Providers: SeriousBroker.it  This test is not yet approved or cleared by the [place] FDA and has been authorized for detection and/or diagnosis of SARS-CoV-2 by FDA under an Emergency Use Authorization (EUA). This EUA will remain in effect (meaning this test can be used) for the duration of the COVID-19 declaration under Section 564(b)(1) of the Act, 21 U.S.C. section 360bbb-3(b)(1), unless the authorization is terminated or revoked.     Resp Syncytial Virus by PCR POSITIVE (A) NEGATIVE Final    Comment: (NOTE) Fact Sheet for Patients: BloggerCourse.com  Fact Sheet for Healthcare Providers: SeriousBroker.it  This test is not yet approved or cleared by the [place] FDA and has been authorized for detection and/or diagnosis of SARS-CoV-2 by FDA under an Emergency Use Authorization (EUA). This EUA will remain in effect (meaning this test can be used) for the duration of the COVID-19 declaration under Section 564(b)(1) of the Act, 21 U.S.C. section 360bbb-3(b)(1), unless the authorization is terminated or revoked.  Performed at [facility], [address] [place], [place] [address]   Blood Culture (routine x 2)     Status: None (Preliminary result)   Collection Time: 07/05/23  9:24 PM   Specimen: BLOOD  Result Value Ref  Range Status   Specimen Description BLOOD LEFT ARM  Final   Special Requests   Final    BOTTLES DRAWN AEROBIC AND ANAEROBIC Blood Culture results may not be optimal due to an inadequate volume of blood received in culture bottles   Culture   Final    NO GROWTH 4 DAYS Performed at [facility], [address] [place], [place] [address]    Report Status PENDING  Incomplete  Blood Culture (routine x 2)     Status: None (Preliminary result)   Collection Time: 07/05/23  9:24 PM   Specimen: BLOOD  Result Value Ref Range Status   Specimen Description BLOOD RIGHT ARM  Final   Special Requests  Final    BOTTLES DRAWN AEROBIC AND ANAEROBIC Blood Culture results may not be optimal due to an inadequate volume of blood received in culture bottles   Culture   Final    NO GROWTH 4 DAYS Performed at [facility], [address] [place], [place] [address]    Report Status PENDING  Incomplete     Labs: BNP (last 3 results) Recent Labs    06/14/23 0847 06/29/23 1119 07/05/23 2037  BNP 613.8* 897.0* 1,021.5*   Basic Metabolic Panel: Recent Labs  Lab 07/05/23 2037 07/06/23 0505 07/07/23 0233 07/08/23 0351  NA 143 138 134* 137  K 3.3* 3.5 3.2* 3.5  CL 94* 92* 94* 94*  CO2 30 29 28 30   GLUCOSE 108* 143* 163* 130*  BUN 28* 30* 50* 54*  CREATININE 1.21 1.17 1.44* 1.22  CALCIUM 9.8 8.9 8.3* 8.8*   Liver Function Tests: Recent Labs  Lab 07/05/23 2037  AST 22  ALT 32  ALKPHOS 138*  BILITOT 1.4*  PROT 8.0  ALBUMIN 4.2   No results for input(s): "LIPASE", "AMYLASE" in the last 168 hours. No results for input(s): "AMMONIA" in the last 168 hours. CBC: Recent Labs  Lab 07/05/23 2037 07/06/23 0505  WBC 19.1* 15.7*  HGB 14.9 13.3  HCT 43.7 40.0  MCV 102.3* 102.0*  PLT 158 145*   Cardiac Enzymes: No results for input(s): "CKTOTAL", "CKMB", "CKMBINDEX", "TROPONINI" in the last 168 hours. BNP: Invalid input(s): "POCBNP" CBG: No results for input(s):  "GLUCAP" in the last 168 hours. D-Dimer No results for input(s): "DDIMER" in the last 72 hours. Hgb A1c No results for input(s): "HGBA1C" in the last 72 hours. Lipid Profile No results for input(s): "CHOL", "HDL", "LDLCALC", "TRIG", "CHOLHDL", "LDLDIRECT" in the last 72 hours. Thyroid function studies No results for input(s): "TSH", "T4TOTAL", "T3FREE", "THYROIDAB" in the last 72 hours.  Invalid input(s): "FREET3" Anemia work up No results for input(s): "VITAMINB12", "FOLATE", "FERRITIN", "TIBC", "IRON", "RETICCTPCT" in the last 72 hours. Urinalysis    Component Value Date/Time   COLORURINE Yellow 11/02/2014 1900   APPEARANCEUR Hazy 11/02/2014 1900   LABSPEC 1.013 11/02/2014 1900   PHURINE 9.0 11/02/2014 1900   GLUCOSEU Negative 11/02/2014 1900   HGBUR 1+ 11/02/2014 1900   BILIRUBINUR Negative 11/02/2014 1900   KETONESUR Negative 11/02/2014 1900   PROTEINUR 30 mg/dL 60/45/4098 1191   NITRITE Negative 11/02/2014 1900   LEUKOCYTESUR 3+ 11/02/2014 1900   Sepsis Labs Recent Labs  Lab 07/05/23 2037 07/06/23 0505  WBC 19.1* 15.7*   Microbiology Recent Results (from the past 240 hours)  Resp panel by RT-PCR (RSV, Flu A&B, Covid) Anterior Nasal Swab     Status: None   Collection Time: 06/29/23 11:19 AM   Specimen: Anterior Nasal Swab  Result Value Ref Range Status   SARS Coronavirus 2 by RT PCR NEGATIVE NEGATIVE Final    Comment: (NOTE) SARS-CoV-2 target nucleic acids are NOT DETECTED.  The SARS-CoV-2 RNA is generally detectable in upper respiratory specimens during the acute phase of infection. The lowest concentration of SARS-CoV-2 viral copies this assay can detect is 138 copies/mL. A negative result does not preclude SARS-Cov-2 infection and should not be used as the sole basis for treatment or other patient management decisions. A negative result may occur with  improper specimen collection/handling, submission of specimen other than nasopharyngeal swab, presence of  viral mutation(s) within the areas targeted by this assay, and inadequate number of viral copies(<138 copies/mL). A negative result must be combined with  clinical observations, patient history, and epidemiological information. The expected result is Negative.  Fact Sheet for Patients:  BloggerCourse.com  Fact Sheet for Healthcare Providers:  SeriousBroker.it  This test is no t yet approved or cleared by the [place] FDA and  has been authorized for detection and/or diagnosis of SARS-CoV-2 by FDA under an Emergency Use Authorization (EUA). This EUA will remain  in effect (meaning this test can be used) for the duration of the COVID-19 declaration under Section 564(b)(1) of the Act, 21 U.S.C.section 360bbb-3(b)(1), unless the authorization is terminated  or revoked sooner.       Influenza A by PCR NEGATIVE NEGATIVE Final   Influenza B by PCR NEGATIVE NEGATIVE Final    Comment: (NOTE) The Xpert Xpress SARS-CoV-2/FLU/RSV plus assay is intended as an aid in the diagnosis of influenza from Nasopharyngeal swab specimens and should not be used as a sole basis for treatment. Nasal washings and aspirates are unacceptable for Xpert Xpress SARS-CoV-2/FLU/RSV testing.  Fact Sheet for Patients: BloggerCourse.com  Fact Sheet for Healthcare Providers: SeriousBroker.it  This test is not yet approved or cleared by the [place] FDA and has been authorized for detection and/or diagnosis of SARS-CoV-2 by FDA under an Emergency Use Authorization (EUA). This EUA will remain in effect (meaning this test can be used) for the duration of the COVID-19 declaration under Section 564(b)(1) of the Act, 21 U.S.C. section 360bbb-3(b)(1), unless the authorization is terminated or revoked.     Resp Syncytial Virus by PCR NEGATIVE NEGATIVE Final    Comment: (NOTE) Fact Sheet for  Patients: BloggerCourse.com  Fact Sheet for Healthcare Providers: SeriousBroker.it  This test is not yet approved or cleared by the [place] FDA and has been authorized for detection and/or diagnosis of SARS-CoV-2 by FDA under an Emergency Use Authorization (EUA). This EUA will remain in effect (meaning this test can be used) for the duration of the COVID-19 declaration under Section 564(b)(1) of the Act, 21 U.S.C. section 360bbb-3(b)(1), unless the authorization is terminated or revoked.  Performed at [facility], [address] [place], [place] [address]   Resp panel by RT-PCR (RSV, Flu A&B, Covid) Anterior Nasal Swab     Status: Abnormal   Collection Time: 07/05/23  8:37 PM   Specimen: Anterior Nasal Swab  Result Value Ref Range Status   SARS Coronavirus 2 by RT PCR NEGATIVE NEGATIVE Final    Comment: (NOTE) SARS-CoV-2 target nucleic acids are NOT DETECTED.  The SARS-CoV-2 RNA is generally detectable in upper respiratory specimens during the acute phase of infection. The lowest concentration of SARS-CoV-2 viral copies this assay can detect is 138 copies/mL. A negative result does not preclude SARS-Cov-2 infection and should not be used as the sole basis for treatment or other patient management decisions. A negative result may occur with  improper specimen collection/handling, submission of specimen other than nasopharyngeal swab, presence of viral mutation(s) within the areas targeted by this assay, and inadequate number of viral copies(<138 copies/mL). A negative result must be combined with clinical observations, patient history, and epidemiological information. The expected result is Negative.  Fact Sheet for Patients:  BloggerCourse.com  Fact Sheet for Healthcare Providers:  SeriousBroker.it  This test is no t yet approved or cleared by the [place] FDA and  has been authorized for detection and/or diagnosis of SARS-CoV-2 by FDA under an Emergency Use Authorization (EUA). This EUA will remain  in effect (meaning this test can be used) for the duration of the COVID-19  declaration under Section 564(b)(1) of the Act, 21 U.S.C.section 360bbb-3(b)(1), unless the authorization is terminated  or revoked sooner.       Influenza A by PCR NEGATIVE NEGATIVE Final   Influenza B by PCR NEGATIVE NEGATIVE Final    Comment: (NOTE) The Xpert Xpress SARS-CoV-2/FLU/RSV plus assay is intended as an aid in the diagnosis of influenza from Nasopharyngeal swab specimens and should not be used as a sole basis for treatment. Nasal washings and aspirates are unacceptable for Xpert Xpress SARS-CoV-2/FLU/RSV testing.  Fact Sheet for Patients: BloggerCourse.com  Fact Sheet for Healthcare Providers: SeriousBroker.it  This test is not yet approved or cleared by the [place] FDA and has been authorized for detection and/or diagnosis of SARS-CoV-2 by FDA under an Emergency Use Authorization (EUA). This EUA will remain in effect (meaning this test can be used) for the duration of the COVID-19 declaration under Section 564(b)(1) of the Act, 21 U.S.C. section 360bbb-3(b)(1), unless the authorization is terminated or revoked.     Resp Syncytial Virus by PCR POSITIVE (A) NEGATIVE Final    Comment: (NOTE) Fact Sheet for Patients: BloggerCourse.com  Fact Sheet for Healthcare Providers: SeriousBroker.it  This test is not yet approved or cleared by the [place] FDA and has been authorized for detection and/or diagnosis of SARS-CoV-2 by FDA under an Emergency Use Authorization (EUA). This EUA will remain in effect (meaning this test can be used) for the duration of the COVID-19 declaration under Section 564(b)(1) of the Act, 21 U.S.C. section  360bbb-3(b)(1), unless the authorization is terminated or revoked.  Performed at [facility], [address] [place], [place] [address]   Blood Culture (routine x 2)     Status: None (Preliminary result)   Collection Time: 07/05/23  9:24 PM   Specimen: BLOOD  Result Value Ref Range Status   Specimen Description BLOOD LEFT ARM  Final   Special Requests   Final    BOTTLES DRAWN AEROBIC AND ANAEROBIC Blood Culture results may not be optimal due to an inadequate volume of blood received in culture bottles   Culture   Final    NO GROWTH 4 DAYS Performed at [facility], [address] [place], [place] [address]    Report Status PENDING  Incomplete  Blood Culture (routine x 2)     Status: None (Preliminary result)   Collection Time: 07/05/23  9:24 PM   Specimen: BLOOD  Result Value Ref Range Status   Specimen Description BLOOD RIGHT ARM  Final   Special Requests   Final    BOTTLES DRAWN AEROBIC AND ANAEROBIC Blood Culture results may not be optimal due to an inadequate volume of blood received in culture bottles   Culture   Final    NO GROWTH 4 DAYS Performed at [facility], [address], [place], [place] [address]    Report Status PENDING  Incomplete     Time coordinating discharge: Over 30 minutes  SIGNED:   Tresa Moore, MD  [facility] 07/09/2023, 9:51 AM Pager   If 7PM-7AM, please contact night-coverage

## 2023-07-09 NOTE — Plan of Care (Signed)

## 2023-07-10 LAB — CULTURE, BLOOD (ROUTINE X 2)
Culture: NO GROWTH
Culture: NO GROWTH

## 2023-07-12 ENCOUNTER — Ambulatory Visit: Admit: 2023-07-12 | Payer: Medicare Other

## 2023-07-31 ENCOUNTER — Ambulatory Visit
Admission: RE | Admit: 2023-07-31 | Discharge: 2023-07-31 | Disposition: A | Discharge status: Home / Self Care | Payer: Medicare Other | Source: Ambulatory Visit | Attending: Urology | Admitting: Urology

## 2023-07-31 DIAGNOSIS — R972 Elevated prostate specific antigen [PSA]: Secondary | ICD-10-CM | POA: Insufficient documentation

## 2023-07-31 MED ORDER — GADOBUTROL 1 MMOL/ML IV SOLN
6.0000 mL | Freq: Once | INTRAVENOUS | Status: AC | PRN
  Administered 2023-07-31: 6 mL via INTRAVENOUS

## 2023-08-13 ENCOUNTER — Other Ambulatory Visit: Payer: Self-pay | Admitting: Oncology

## 2023-08-19 ENCOUNTER — Other Ambulatory Visit: Payer: Self-pay | Admitting: Student in an Organized Health Care Education/Training Program

## 2023-08-27 ENCOUNTER — Inpatient Hospital Stay: Payer: Medicare Other | Attending: Oncology

## 2023-08-27 ENCOUNTER — Ambulatory Visit
Admission: RE | Admit: 2023-08-27 | Discharge: 2023-08-27 | Disposition: A | Discharge status: Home / Self Care | Payer: Medicare Other | Source: Ambulatory Visit | Attending: Oncology | Admitting: Oncology

## 2023-08-27 DIAGNOSIS — I700 Atherosclerosis of aorta: Secondary | ICD-10-CM | POA: Insufficient documentation

## 2023-08-27 DIAGNOSIS — Z79899 Other long term (current) drug therapy: Secondary | ICD-10-CM | POA: Diagnosis not present

## 2023-08-27 DIAGNOSIS — Z85118 Personal history of other malignant neoplasm of bronchus and lung: Secondary | ICD-10-CM | POA: Insufficient documentation

## 2023-08-27 DIAGNOSIS — Z08 Encounter for follow-up examination after completed treatment for malignant neoplasm: Secondary | ICD-10-CM | POA: Diagnosis present

## 2023-08-27 DIAGNOSIS — Z8719 Personal history of other diseases of the digestive system: Secondary | ICD-10-CM | POA: Insufficient documentation

## 2023-08-27 DIAGNOSIS — Z95828 Presence of other vascular implants and grafts: Secondary | ICD-10-CM | POA: Diagnosis not present

## 2023-08-27 DIAGNOSIS — Z9221 Personal history of antineoplastic chemotherapy: Secondary | ICD-10-CM | POA: Insufficient documentation

## 2023-08-27 DIAGNOSIS — Z9889 Other specified postprocedural states: Secondary | ICD-10-CM | POA: Diagnosis not present

## 2023-08-27 DIAGNOSIS — Z7901 Long term (current) use of anticoagulants: Secondary | ICD-10-CM | POA: Diagnosis not present

## 2023-08-27 DIAGNOSIS — Z87891 Personal history of nicotine dependence: Secondary | ICD-10-CM | POA: Diagnosis not present

## 2023-08-27 DIAGNOSIS — Z86718 Personal history of other venous thrombosis and embolism: Secondary | ICD-10-CM | POA: Diagnosis present

## 2023-08-27 DIAGNOSIS — D649 Anemia, unspecified: Secondary | ICD-10-CM | POA: Diagnosis not present

## 2023-08-27 DIAGNOSIS — K5732 Diverticulitis of large intestine without perforation or abscess without bleeding: Secondary | ICD-10-CM | POA: Diagnosis present

## 2023-08-27 DIAGNOSIS — C3491 Malignant neoplasm of unspecified part of right bronchus or lung: Secondary | ICD-10-CM | POA: Insufficient documentation

## 2023-08-27 DIAGNOSIS — J439 Emphysema, unspecified: Secondary | ICD-10-CM | POA: Diagnosis not present

## 2023-08-27 DIAGNOSIS — Z923 Personal history of irradiation: Secondary | ICD-10-CM | POA: Insufficient documentation

## 2023-08-27 DIAGNOSIS — Z86711 Personal history of pulmonary embolism: Secondary | ICD-10-CM | POA: Insufficient documentation

## 2023-08-27 DIAGNOSIS — K5720 Diverticulitis of large intestine with perforation and abscess without bleeding: Secondary | ICD-10-CM

## 2023-08-27 DIAGNOSIS — N321 Vesicointestinal fistula: Secondary | ICD-10-CM

## 2023-08-27 HISTORY — DX: Vesicointestinal fistula: N32.1

## 2023-08-27 HISTORY — DX: Diverticulitis of large intestine with perforation and abscess without bleeding: K57.20

## 2023-08-27 LAB — CBC WITH DIFFERENTIAL (CANCER CENTER ONLY)
Abs Immature Granulocytes: 0.07 10*3/uL (ref 0.00–0.07)
Basophils Absolute: 0.1 10*3/uL (ref 0.0–0.1)
Basophils Relative: 1 %
Eosinophils Absolute: 0.3 10*3/uL (ref 0.0–0.5)
Eosinophils Relative: 2 %
HCT: 44.6 % (ref 39.0–52.0)
Hemoglobin: 14.8 g/dL (ref 13.0–17.0)
Immature Granulocytes: 1 %
Lymphocytes Relative: 10 %
Lymphs Abs: 1.4 10*3/uL (ref 0.7–4.0)
MCH: 33.1 pg (ref 26.0–34.0)
MCHC: 33.2 g/dL (ref 30.0–36.0)
MCV: 99.8 fL (ref 80.0–100.0)
Monocytes Absolute: 1.1 10*3/uL — ABNORMAL HIGH (ref 0.1–1.0)
Monocytes Relative: 8 %
Neutro Abs: 10.9 10*3/uL — ABNORMAL HIGH (ref 1.7–7.7)
Neutrophils Relative %: 78 %
Platelet Count: 216 10*3/uL (ref 150–400)
RBC: 4.47 MIL/uL (ref 4.22–5.81)
RDW: 14.4 % (ref 11.5–15.5)
WBC Count: 13.9 10*3/uL — ABNORMAL HIGH (ref 4.0–10.5)
nRBC: 0 % (ref 0.0–0.2)

## 2023-08-27 LAB — CMP (CANCER CENTER ONLY)
ALT: 12 U/L (ref 0–44)
AST: 23 U/L (ref 15–41)
Albumin: 3.5 g/dL (ref 3.5–5.0)
Alkaline Phosphatase: 131 U/L — ABNORMAL HIGH (ref 38–126)
Anion gap: 11 (ref 5–15)
BUN: 12 mg/dL (ref 6–20)
CO2: 22 mmol/L (ref 22–32)
Calcium: 9.1 mg/dL (ref 8.9–10.3)
Chloride: 101 mmol/L (ref 98–111)
Creatinine: 0.99 mg/dL (ref 0.61–1.24)
GFR, Estimated: >60 mL/min (ref >60)
Glucose, Bld: 75 mg/dL (ref 70–99)
Potassium: 4.8 mmol/L (ref 3.5–5.1)
Sodium: 134 mmol/L — ABNORMAL LOW (ref 135–145)
Total Bilirubin: 1.4 mg/dL — ABNORMAL HIGH (ref 0.0–1.2)
Total Protein: 7.9 g/dL (ref 6.5–8.1)

## 2023-08-27 LAB — MAGNESIUM: Magnesium: 1.5 mg/dL — ABNORMAL LOW (ref 1.7–2.4)

## 2023-08-27 MED ORDER — SODIUM CHLORIDE 0.9% FLUSH
10.0000 mL | INTRAVENOUS | Status: DC | PRN
  Administered 2023-08-27: 10 mL via INTRAVENOUS
  Filled 2023-08-27: qty 10

## 2023-08-27 MED ORDER — HEPARIN SOD (PORK) LOCK FLUSH 100 UNIT/ML IV SOLN
500.0000 [IU] | Freq: Once | INTRAVENOUS | Status: AC
Start: 2023-08-27 — End: 2023-08-27
  Administered 2023-08-27: 500 [IU] via INTRAVENOUS
  Filled 2023-08-27: qty 5

## 2023-08-27 MED ORDER — IOHEXOL 300 MG/ML  SOLN
100.0000 mL | Freq: Once | INTRAMUSCULAR | Status: AC | PRN
  Administered 2023-08-27: 100 mL via INTRAVENOUS

## 2023-08-29 ENCOUNTER — Inpatient Hospital Stay: Payer: Medicare Other

## 2023-08-29 VITALS — BP 130/83 | HR 110

## 2023-08-29 DIAGNOSIS — Z95828 Presence of other vascular implants and grafts: Secondary | ICD-10-CM

## 2023-08-29 DIAGNOSIS — C3491 Malignant neoplasm of unspecified part of right bronchus or lung: Secondary | ICD-10-CM | POA: Diagnosis not present

## 2023-08-29 MED ORDER — SODIUM CHLORIDE 0.9% FLUSH
10.0000 mL | INTRAVENOUS | Status: DC | PRN
Start: 2023-08-29 — End: 2023-08-29
  Administered 2023-08-29: 10 mL via INTRAVENOUS
  Filled 2023-08-29: qty 10

## 2023-08-29 MED ORDER — HEPARIN SOD (PORK) LOCK FLUSH 100 UNIT/ML IV SOLN
500.0000 [IU] | Freq: Once | INTRAVENOUS | Status: AC
Start: 2023-08-29 — End: 2023-08-29
  Administered 2023-08-29: 500 [IU] via INTRAVENOUS
  Filled 2023-08-29: qty 5

## 2023-08-29 MED ORDER — ALTEPLASE 2 MG IJ SOLR
2.0000 mg | Freq: Once | INTRAMUSCULAR | Status: AC | PRN
  Administered 2023-08-29: 2 mg
  Filled 2023-08-29: qty 2

## 2023-08-30 ENCOUNTER — Telehealth: Payer: Self-pay

## 2023-08-30 DIAGNOSIS — C3491 Malignant neoplasm of unspecified part of right bronchus or lung: Secondary | ICD-10-CM

## 2023-08-30 NOTE — Telephone Encounter (Signed)
-----   Message from Rickard Patience sent at 08/30/2023  2:31 PM EST ----- Please add lab repeat mag level and possible IV Mag on his next visit. Thanks.

## 2023-08-30 NOTE — Telephone Encounter (Signed)
Please add lab and IV Mag on 2/24. Pt advised to be here around 9:30a for labs

## 2023-09-03 ENCOUNTER — Inpatient Hospital Stay (HOSPITAL_BASED_OUTPATIENT_CLINIC_OR_DEPARTMENT_OTHER): Payer: Medicare Other | Admitting: Oncology

## 2023-09-03 ENCOUNTER — Inpatient Hospital Stay: Payer: Medicare Other

## 2023-09-03 ENCOUNTER — Telehealth: Payer: Self-pay

## 2023-09-03 ENCOUNTER — Encounter: Payer: Self-pay | Admitting: Oncology

## 2023-09-03 DIAGNOSIS — C3491 Malignant neoplasm of unspecified part of right bronchus or lung: Secondary | ICD-10-CM

## 2023-09-03 DIAGNOSIS — K5730 Diverticulosis of large intestine without perforation or abscess without bleeding: Secondary | ICD-10-CM

## 2023-09-03 DIAGNOSIS — J850 Gangrene and necrosis of lung: Secondary | ICD-10-CM

## 2023-09-03 DIAGNOSIS — Z86718 Personal history of other venous thrombosis and embolism: Secondary | ICD-10-CM | POA: Diagnosis not present

## 2023-09-03 DIAGNOSIS — Z95828 Presence of other vascular implants and grafts: Secondary | ICD-10-CM

## 2023-09-03 DIAGNOSIS — R634 Abnormal weight loss: Secondary | ICD-10-CM

## 2023-09-03 LAB — MAGNESIUM: Magnesium: 1.3 mg/dL — ABNORMAL LOW (ref 1.7–2.4)

## 2023-09-03 MED ORDER — MAGNESIUM SULFATE 4 GM/100ML IV SOLN
4.0000 g | Freq: Once | INTRAVENOUS | Status: AC
  Administered 2023-09-03: 4 g via INTRAVENOUS
  Filled 2023-09-03: qty 100

## 2023-09-03 MED ORDER — HEPARIN SOD (PORK) LOCK FLUSH 100 UNIT/ML IV SOLN
500.0000 [IU] | Freq: Once | INTRAVENOUS | Status: AC
  Administered 2023-09-03: 500 [IU] via INTRAVENOUS
  Filled 2023-09-03: qty 5

## 2023-09-03 MED ORDER — MAGNESIUM CHLORIDE 64 MG PO TBEC: 1.0000 | DELAYED_RELEASE_TABLET | Freq: Two times a day (BID) | ORAL | 1 refills | Status: AC

## 2023-09-03 MED ORDER — SODIUM CHLORIDE 0.9 % IV SOLN
INTRAVENOUS | Status: DC
  Filled 2023-09-03: qty 250

## 2023-09-03 MED ORDER — RIVAROXABAN 10 MG PO TABS: 10.0000 mg | ORAL_TABLET | Freq: Every day | ORAL | 3 refills | Status: DC

## 2023-09-03 NOTE — Progress Notes (Signed)
 Hematology/Oncology Progress note [phone] [phone]    Reason for Visit  Follow up for lung cancer treatments.  ASSESSMENT & PLAN:   Cancer Staging  Squamous cell carcinoma of right lung [facility]) Staging form: Lung, AJCC 8th Edition - Clinical stage from 07/08/2020: Stage IVA (rcT4, cM1a) - Signed by Rickard Patience, MD on 07/08/2020   Squamous cell carcinoma of right lung (HCC) 07/16/2020-09/28/2020 carboplatin Taxol and Keytruda x 1  partial response -Keytruda maintenance.-04/22/2021, progression-resumed on carboplatin/Taxol/Keytruda - did not tolerate due to myelosuppression.-->  Taxol held, carboplatin/Keytruda--> CT 07/2020 progression --> 08/03/2021 started docetaxel and ramucirumab.-->confirmed to have necrotizing pneumonia pseudomonas--> chemo discontinued.  Labs are reviewed and discussed with patient. CT scan showed no signs of cancer progression. - right lung base nodularity,  Repeat CT chest in 3 months.   History of DVT and PE Unprovoked left lower extremity DVT, status post thrombolysis/thrombectomy, Nonocclusive PE  He can not afford Eliquis to 2.5 mg twice daily.- self stopped and has been off for a few week.  Switch to Xarelto 10mg  daily.   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 Observation.  Necrotic pneumonia [facility]) Patient has been treated with antibiotics.   Recommend patient to continue follow-up with pulmonology  Hypomagnesemia Recommend slow mag 1 tab, increase dose to twice daily IV mag 4g x 1   Weight loss Possibly due to recent pneumonia hospitalization.  He declined nutritionist evaluation.  Recommend nutrition supplements.   Orders Placed This Encounter  Procedures   Magnesium    Standing Status:   Future    Expected Date:   09/10/2023    Expiration Date:   09/02/2024    Follow-up 3 months  All questions were answered. The patient knows to call the clinic  with any problems, questions or concerns.  Rickard Patience, MD, PhD [facility] Hematology Oncology 09/03/2023     HPI  Oncology History  Squamous cell carcinoma of right lung [facility])  08/13/2018 Surgery   s/p Bronchoscopy on 06/25/2018. subcarina EBUS FNA was non diagnostic, hypocellular specimen.  08/13/18 Patient underwent right thoracotomy and wedge resection of a right upper lobe mass.  Frozen section was consistent with an inflammatory process.  On the second postop day, preliminary pathology reports high-grade malignancy.  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  08/20/2018 Patient was therefore taken back to the OR and underwent take complete lobectomy  pT1b pN0 cM0 stage I squamous cell lung cancer. Margin is negative.  Recommend observation.     08/13/2018 Initial Diagnosis   Stage I Squamous cell carcinoma of lung, right (HCC)   12/03/2018 Progression   12/03/2018 CT chest showed abnormal soft tissue in the right suprahilar region measuring up to 2.5 cm, worrisome for recurrence 12/10/2018 PET Hypermetabolic RIGHT suprahilar mass consistent with lung cancer, recurrence. 12/26/2018 s/p bronchoscopy biopsy. Confirmed local recurrence of squamous cell lung cancer.  medi port placed by Dr.Oaks. 01/20/19 MRI brain w/wo contrast: No evidence of metastatic disease   01/17/2019 - 03/07/2019 Chemotherapy   concurrent chemoradiation on 01/16/2019 Carboplatin AUC of 2 and Taxol 45 mg/m2 weekly finished in 03/05/2019    03/31/2019 Imaging   03/31/19 CT chest showed Right suprahilar lesion has decreased slightly in size in the interval since prior PET-CT.  No new or progressive findings on today's study.   04/10/2019 - 05/20/2020 Chemotherapy   04/10/2019, patient started on durvalumab Q 2 weeks maintenance.    06/14/2020 Progression   06/14/2020 CT chest w contrast  showed vidence of progressive lymphangitic spread of tumor throughout the right lung, with contralateral lung nodules with  right pleural  effusion  07/01/22 MRI brain is negative for CNS involvement.  CT abdomen with contrast showed no evidence of abdominal metastatic disease PET scan was not approved by insurance-peer to peer appeal with Dr.Vipul Bhanderi    07/08/2020 Cancer Staging   Staging form: Lung, AJCC 8th Edition - Clinical stage from 07/08/2020: Stage IVA (rcT4, cM1a) - Signed by Rickard Patience, MD on 07/08/2020   07/16/2020 - 09/28/2021 Chemotherapy   Carboplatin + Paclitaxel + Pembrolizumab q21d x 4 cycles      10/15/2020 Imaging   10/15/2020 CT chest abdomen pelvis redemonstrated postoperative and postradiation appearance of the right chest with perihilar consolidation and fibrosis.  Slight interval decrease in size of the pulmonary nodules associated with interlobular septal thickening nearly complete resolution of previously seen left-sided pulmonary nodules.  Minimal irregular residual.  Right pleural effusion improved.  Consistent with treatment response.  No evidence of metastatic disease with the abdomen or pelvis.  None obstructive bilateral nephrolithiasis  - partial response to the treatment-  12/15/2020, CT chest without contrast showed resolution of lung nodules, no evidence of recurrent disease   10/19/2020 -  Chemotherapy   Keytruda monotherapy maintenance   12/30/2020 [facility] Admission   patient was admitted due to unprovoked extensive deep vein thrombosis from calf vein to the common femoral vein on the left.  Patient was started on heparin drip.  Patient underwent thrombolysis and is a colectomy on 12/31/2020.  Anticoagulation was switched to Eliquis at discharge   04/22/2021 Imaging   CT chest with contrast showed none occlusive pulmonary embolism, Interval progression of consolidation airspace opacity in the medial right middle lobe with bandlike opacity and bronchiectasis in the perihilar right lung,-this is compatible with evolving posttreatment changes although recurrent disease anteriorly is not  excluded.Interval development of numerous bilateral pulmonary nodules, measuring up to 11 mm in the right lung base.  Concerning for metastatic disease.  Tiny right pleural effusion.    04/26/2021 -  Chemotherapy   resumed on carboplatin/Taxol/Keytruda. Patient did not tolerate his chemotherapy of carboplatin/Taxol/Keytruda on 04/26/2021. Patient has developed severe anemia status post PRBC transfusion. 05/25/2021, carboplatin AUC 5 with Keytruda 06/23/2021  continued on carboplatin AUC 5 with Keytruda 07/07/2021 carboplatin with Rande Lawman      07/15/2021 Progression   07/15/2021 CT chest with contrast showed enlarged right lower lobe thick-walled cavitary lesion in the anterior aspect of the right lower lobe.  Concerning for progression.  Multiple other small pulmonary nodules are also noted elsewhere in the lungs, many of which are stable compared to prior studies.  Several are new or clearly enlarged.  The best example is an enlarging pulmonary nodule in the left upper lobe, 8 x 6 mm comparing to 4 x 3 mm on 04/22/2021   08/03/2021 - 02/14/2022 Chemotherapy   LUNG Docetaxel + Ramucirumab q21d      11/16/2021 Imaging   1. The previously noted thick-walled cavity of concern in the right lower lobe now appears more simple and thin-walled in appearance, presumably a post infectious cavity. There is a new adjacent thick-walled cavity in the right lower lobe which is aggressive in appearance, but given the evolution of the previous lesion, this may also be of infectious etiology. 2. However, there is an enlarging mixed cystic and solid nodule in the left upper lobe which is concerning for metastatic lesion or new primary lesion. This currently  measures 11 x 5 x 10 mm. Close attention on follow-up studies is recommended. 3. No definite signs of metastatic disease to the abdomen or pelvis. 4. Extensive colonic diverticulosis most involved in the sigmoid colon where there appear to be several small diverticular  abscesses. one of these appears to fistulized into the wall of the urinary bladder, without frank fistulization to the lumen of the urinary bladder at this time.    02/01/2022 - 02/05/2022 Hospital Admission   Patient was admitted for HCAP, sepsis and COPD exacerbation.  Patient was treated with IV vancomycin, cefepime, prednisone, added doxycycline to cover atypicals given CT findings.  Patient was discharged with Augmentin and doxycycline and completed 7 days of course.   02/01/2022 Imaging   CT angiogram chest with and without contrast 1. Bilateral chronic pulmonary embolus but no acute pulmonary embolus. 2. Probable pulmonary arterial hypertension. 3.  Aortic Atherosclerosis (ICD10-I70.0).  Coronary atherosclerosis. 4. Improved airspace opacity along the peripheral cavitary process in the right lower lobe, currently the thin walled air cyst or cavity measures about 1.4 cm in diameter. 5. Increased peripheral tree-in-bud reticulonodular opacities in both lower lobes in the lingula, compatible with atypical infection. 6. Airway thickening and airway plugging particularly in the right lower lobe. 7. Mosaic attenuation in the lungs, stable but nonspecific.8.  Emphysema (ICD10-J43.9). 9. Stable radiation therapy related findings in the right perihilar regions. Right upper lobectomy.   02/10/2022 - 02/12/2022 Hospital Admission   Patient was admitted due to acute hypoxic respiratory failure CT findings highly suspicious for MAC Patient had AFB sputum x 3 and has follow-up appointment with ID to review results.  Patient was discharged on 2 L of nasal cannula oxygen   02/10/2022 Imaging   CT chest without contrast 1. Cavitary lesions of the anterior right lower lobe demonstrate increased wall thickening and there is a new irregular solid pulmonary nodule of the left upper lobe. 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 2. Stable right paramediastinal postradiation change. 3. Similar bilateral mosaic attenuation, findings can be seen in setting of small airways disease or pulmonary hypertension.4. Aortic Atherosclerosis (ICD10-I70.0) and Emphysema (ICD10-J43.9).   07/17/2022 Imaging   CT chest abdomen pelvis w contrast 1. Unchanged post treatment appearance of the right chest, with dense, perihilar fibrotic bronchiectasis, consolidation, and architectural distortion. 2. Unchanged multilobulated cystic lesion of the anterior right lower lobe, with some adjacent irregular consolidation. Findings are most consistent with sequelae of prior infection. 3. Irregular nodules previously described in the left upper lobe are almost completely resolved, consistent with resolving infection or inflammation. 4. Background of very fine centrilobular and ground-glass nodularity, most concentrated in the lung apices, most commonly seen in smoking-related respiratory bronchiolitis. 5. No evidence of metastatic disease in the abdomen or pelvis.Aortic Atherosclerosis    10/20/2022 Imaging   CT chest wo contrast  1. Stable right perihilar postradiation change with no evidence of recurrent or metastatic disease. 2. Redemonstration of multiloculated cystic lesion of the anterior right lower lobe. Adjacent irregular consolidation is slightly decreased in size. Findings are likely sequela of prior infection. 3. Numerous small centrilobular pulmonary nodules which are most numerous in the upper lungs, similar to prior likely due to smoking-related respiratory bronchiolitis. 4. Aortic Atherosclerosis   08/27/2023 Imaging   CT chest abdomen pelvis w contrast showed 1. Status post right upper lobectomy. 2. An area of cystic bronchiectasis in  the right lung base demonstrates nodularity within, felt to be relatively similar to 06/14/2023. This could represent  superimposed infection, including fungal ball. Metastatic disease is felt less likely, especially given alteration of shaped/position today. Consider chest CT follow-up at 3 months. 3. Otherwise, no evidence of recurrent or metastatic disease. 4. Findings highly suspicious for colovesical fistula and superimposed cystitis. Likely secondary to interval episode of diverticulitis. 5. Aortic atherosclerosis (ICD10-I70.0), coronary artery atherosclerosis and emphysema (ICD10-J43.9). 6. Trace right pleural fluid 7. Persistent filling defect along the distal Port-A-Cath, which could represent adherent thrombus.    03/06/2022, sputum collected shows Pseudomonas  aeruginosa which explains his findings on the CT scan.  There were 2 types of pseudomonas colonies and they both are resistant o ciprofloxacin. patient was referred to infectious disease and was seen by Dr. Joylene Draft on 03/09/2022.  Patient was treated with meropenem and completed his course on 04/04/2022. Chemotherapy has been on hold   INTERVAL HISTORY Samuel Choi is a 58 y.o. male who has above history reviewed by me today presents for follow up visit for metastatic squamous cell carcinoma of lung. He reports feeling well. No new complaints.  Appetite is fair.  07/04/2024- 07/08/2024 He has had pneumonia due to RSV. Has lost weight. He self discontinued Eliquis 2.5mg  BID due to high copay  REVIEW OF SYSTEMS:  Review of Systems  Constitutional:  Positive for fatigue. Negative for appetite change, chills, fever and unexpected weight change.  HENT:   Negative for hearing loss and voice change.   Eyes:  Negative for eye problems and icterus.  Respiratory:  Positive for shortness of breath. Negative for chest tightness and cough.   Cardiovascular:  Negative for chest pain and leg swelling.  Gastrointestinal:  Negative for abdominal distention, abdominal pain and blood in stool.  Endocrine: Negative for hot flashes.  Genitourinary:   Negative for difficulty urinating, dysuria and frequency.   Musculoskeletal:  Negative for arthralgias.  Skin:  Negative for itching and rash.  Neurological:  Negative for extremity weakness, light-headedness and numbness.  Hematological:  Negative for adenopathy. Does not bruise/bleed easily.  Psychiatric/Behavioral:  Negative for confusion.       VITALS:  Blood pressure 116/77, pulse 83, temperature (!) 96.7 F (35.9 C), temperature source Tympanic, resp. rate 18, weight 123 lb 11.2 oz (56.1 kg), SpO2 98%.  Wt Readings from Last 3 Encounters:  09/03/23 123 lb 11.2 oz (56.1 kg)  07/08/23 125 lb 7.1 oz (56.9 kg)  06/29/23 147 lb 4.3 oz (66.8 kg)    Body mass index is 16.32 kg/m.  Performance status (ECOG): 1 - Symptomatic but completely ambulatory  PHYSICAL EXAM:  Physical Exam HENT:     Head: Normocephalic and atraumatic.  Eyes:     General: No scleral icterus. Cardiovascular:     Rate and Rhythm: Normal rate.  Pulmonary:     Effort: Pulmonary effort is normal.     Breath sounds: No wheezing or rales.     Comments: Decreased breath sound bilaterally  Abdominal:     General: There is no distension.     Palpations: Abdomen is soft.     Tenderness: There is no abdominal tenderness.  Musculoskeletal:        General: No swelling.     Cervical back: Normal range of motion.  Skin:    General: Skin is warm and dry.  Neurological:     General: No focal deficit present.     Mental Status: He is alert. Mental status  is at baseline.  Psychiatric:        Mood and Affect: Mood normal.     LABS:      Latest Ref Rng & Units 08/27/2023    9:10 AM 07/06/2023    5:05 AM 07/05/2023    8:37 PM  CBC  WBC 4.0 - 10.5 K/uL 13.9  15.7  19.1   Hemoglobin 13.0 - 17.0 g/dL 16.1  09.6  04.5   Hematocrit 39.0 - 52.0 % 44.6  40.0  43.7   Platelets 150 - 400 K/uL 216  145  158       Latest Ref Rng & Units 08/27/2023    9:10 AM 07/08/2023    3:51 AM 07/07/2023    2:33 AM  CMP   Glucose 70 - 99 mg/dL 75  409  811   BUN 6 - 20 mg/dL 12  54  50   Creatinine 0.61 - 1.24 mg/dL 9.14  7.82  9.56   Sodium 135 - 145 mmol/L 134  137  134   Potassium 3.5 - 5.1 mmol/L 4.8  3.5  3.2   Chloride 98 - 111 mmol/L 101  94  94   CO2 22 - 32 mmol/L 22  30  28    Calcium 8.9 - 10.3 mg/dL 9.1  8.8  8.3   Total Protein 6.5 - 8.1 g/dL 7.9     Total Bilirubin 0.0 - 1.2 mg/dL 1.4     Alkaline Phos 38 - 126 U/L 131     AST 15 - 41 U/L 23     ALT 0 - 44 U/L 12        HISTORY:   Past Medical History:  Diagnosis Date   Alcohol abuse    usually drinks 2-3 drinks per day   Atherosclerosis 06/2018   Cancer associated pain    Chronic sinusitis    Dehydration 02/07/2019   Emphysema of lung (HCC) 06/2018   patient unaware of this.   Hip fracture (HCC) 05/2018   no surgery   History of kidney stones 05/2018   per xray, bilateral nephrolitiasis   Hypertension    Squamous cell carcinoma of lung, right (HCC) 06/2018    Past Surgical History:  Procedure Laterality Date   BRAIN SURGERY  10/2017   nasal/sinus endoscopy. mass benign   ELECTROMAGNETIC NAVIGATION BROCHOSCOPY Right 06/25/2018   Procedure: ELECTROMAGNETIC NAVIGATION BRONCHOSCOPY;  Surgeon: Erin Fulling, MD;  Location: [facility] ORS;  Service: Cardiopulmonary;  Laterality: Right;   ENDOBRONCHIAL ULTRASOUND Right 06/25/2018   Procedure: ENDOBRONCHIAL ULTRASOUND;  Surgeon: Erin Fulling, MD;  Location: [facility];  Service: Cardiopulmonary;  Laterality: Right;   ENDOBRONCHIAL ULTRASOUND Right 12/26/2018   Procedure: ENDOBRONCHIAL ULTRASOUND RIGHT;  Surgeon: Erin Fulling, MD;  Location: [facility];  Service: Cardiopulmonary;  Laterality: Right;   FLEXIBLE BRONCHOSCOPY N/A 08/20/2018   Procedure: FLEXIBLE BRONCHOSCOPY PREOP;  Surgeon: Hulda Marin, MD;  Location: [facility] OR[facility];  Service: Thoracic;  Laterality: N/A;   IR CV LINE INJECTION  04/04/2019   NASAL SINUS SURGERY  10/2017   At [facility], frontal sinusotomy, ethmoidectomy, resection  anterior cranial fossa neoplasm, turbinate resection   PERIPHERAL VASCULAR THROMBECTOMY Left 12/31/2020   Procedure: PERIPHERAL VASCULAR THROMBECTOMY;  Surgeon: Annice Needy, MD;  Location: [facility];  Service: Cardiovascular;  Laterality: Left;   PORTACATH PLACEMENT Left 01/15/2019   Procedure: INSERTION PORT-A-CATH;  Surgeon: Hulda Marin, MD;  Location: [facility];  Service: General;  Laterality: Left;   THORACOTOMY Right 08/13/2018   Procedure:  PREOP BROCHOSCOPY WITH RIGHT THORACOTOMY AND RUL RESECTION;  Surgeon: Hulda Marin, MD;  Location: [facility];  Service: General;  Laterality: Right;   THORACOTOMY Right 08/20/2018   Procedure: THORACOTOMY MAJOR RIGHT UPPER LOBE LOBECTOMY;  Surgeon: Hulda Marin, MD;  Location: [facility] OR[facility];  Service: Thoracic;  Laterality: Right;   TOE SURGERY Left    pin in left toe    Family History  Problem Relation Age of Onset   Breast cancer Mother    Diabetes Mother    Lung cancer Father    Hypertension Father     Social History:  reports that he quit smoking about 5 years ago. His smoking use included cigarettes. He started smoking about 20 years ago. He has a 7.5 pack-year smoking history. He has never used smokeless tobacco. He reports current alcohol use of about 3.0 standard drinks of alcohol per week. He reports that he does not use drugs.  Allergies: No Known Allergies  Current Medications: Current Outpatient Medications  Medication Sig Dispense Refill   albuterol (PROVENTIL) (2.5 MG/3ML) 0.083% nebulizer solution Take 3 mLs (2.5 mg total) by nebulization every 6 (six) hours as needed for wheezing or shortness of breath. 120 mL 0   albuterol (VENTOLIN HFA) 108 (90 Base) MCG/ACT inhaler INHALE 2 PUFFS BY MOUTH EVERY 6 HOURS AS NEEDED FOR WHEEZING OR SHORTNESS OF BREATH 34 g 0   benzonatate (TESSALON) 200 MG capsule Take 1 capsule (200 mg total) by mouth 3 (three) times daily as needed for cough. 20 capsule 0   Budeson-Glycopyrrol-Formoterol  (BREZTRI AEROSPHERE) 160-9-4.8 MCG/ACT AERO Inhale 2 puffs into the lungs in the morning and at bedtime. 10.7 g 0   cloNIDine (CATAPRES) 0.1 MG tablet Take 1 tablet (0.1 mg total) by mouth 3 (three) times daily.     cyanocobalamin (VITAMIN B12) 1000 MCG tablet Take 1 tablet (1,000 mcg total) by mouth daily. 90 tablet 1   diltiazem (CARDIZEM CD) 120 MG 24 hr capsule Take 120 mg by mouth daily.     feeding supplement (ENSURE ENLIVE / ENSURE PLUS) LIQD Take 237 mLs by mouth 3 (three) times daily between meals. 237 mL 12   furosemide (LASIX) 40 MG tablet Take 1 tablet (40 mg total) by mouth daily as needed for fluid or edema. 30 tablet 0   latanoprost (XALATAN) 0.005 % ophthalmic solution Place 1 drop into both eyes at bedtime.     loratadine (CLARITIN) 10 MG tablet TAKE 1 TABLET(10 MG) BY MOUTH DAILY AS NEEDED FOR ALLERGIES 90 tablet 1   metoprolol succinate (TOPROL-XL) 25 MG 24 hr tablet Take 1 tablet (25 mg total) by mouth daily. 30 tablet 2   Multiple Vitamin (MULTIVITAMIN WITH MINERALS) TABS tablet Take 1 tablet by mouth daily.     pantoprazole (PROTONIX) 20 MG tablet TAKE 1 TABLET BY MOUTH DAILY 100 tablet 2   potassium chloride SA (KLOR-CON M) 20 MEQ tablet Take 1 tablet (20 mEq total) by mouth daily. 3 tablet 0   rivaroxaban (XARELTO) 10 MG TABS tablet Take 1 tablet (10 mg total) by mouth daily. 30 tablet 3   magnesium chloride (SLOW-MAG) 64 MG TBEC SR tablet Take 1 tablet (64 mg total) by mouth 2 (two) times daily. 180 tablet 1   No current facility-administered medications for this visit.   Facility-Administered Medications Ordered in Other Visits  Medication Dose Route Frequency Provider Last Rate Last Admin   0.9 %  sodium chloride infusion   Intravenous Continuous Rickard Patience, MD   Stopped  at 09/03/23 1222   dexamethasone (DECADRON) 10 MG/ML injection            heparin lock flush 100 unit/mL  500 Units Intravenous Once Rickard Patience, MD       heparin lock flush 100 unit/mL  500 Units  Intravenous Once Rickard Patience, MD       sodium chloride flush (NS) 0.9 % injection 10 mL  10 mL Intravenous PRN Rickard Patience, MD   10 mL at 04/04/19 0903   sodium chloride flush (NS) 0.9 % injection 10 mL  10 mL Intravenous PRN Rickard Patience, MD   10 mL at 07/26/20 1308   sodium chloride flush (NS) 0.9 % injection 10 mL  10 mL Intravenous PRN Rickard Patience, MD   10 mL at 07/28/21 1610   sodium chloride flush (NS) 0.9 % injection 10 mL  10 mL Intravenous PRN Rickard Patience, MD   10 mL at 12/21/22 1308       IMAGE STUDIES:  CT CHEST ABDOMEN PELVIS W CONTRAST Result Date: 09/03/2023 CLINICAL DATA:  * Tracking Code: BO * squamous cell carcinoma of right lung. Status post thoracotomy and wedge resection of right upper lobe lung mass in 2020. Status post chemotherapy and radiation therapy. EXAM: CT CHEST, ABDOMEN, AND PELVIS WITH CONTRAST TECHNIQUE: Multidetector CT imaging of the chest, abdomen and pelvis was performed following the standard protocol during bolus administration of intravenous contrast. RADIATION DOSE REDUCTION: This exam was performed according to the departmental dose-optimization program which includes automated exposure control, adjustment of the mA and/or kV according to patient size and/or use of iterative reconstruction technique. CONTRAST:  OMNIPAQUE IOHEXOL 300 MG/ML  SOLN COMPARISON:  02/23/2023 staging CTs.  Interval CTA chest 06/14/2023. FINDINGS: CT CHEST FINDINGS Cardiovascular: Left Port-A-Cath tip low SVC. SVC filling defect along the distal most portion of the Port-A-Cath included on 31/2, similar. Aortic atherosclerosis. Normal heart size, without pericardial effusion. Lad coronary artery calcification. No central pulmonary embolism, on this non-dedicated study. Mediastinum/Nodes: No supraclavicular adenopathy. No mediastinal or hilar adenopathy. The prevascular adenopathy on the prior exam has resolved, with a node measuring 6 mm today. Lungs/Pleura: Trace right pleural fluid is unchanged.  Advanced centrilobular emphysema. Status post right upper lobectomy. Right perihilar radiation fibrosis again identified with volume loss, consolidation, and traction bronchiectasis. Anterior right lung base cystic bronchiectasis again identified. nodularity within measures 1.6 x 1.1 by 1.1 cm on 94/4 versus 1.5 x 0.9 by 1.3 cm on the prior. The nodularity just lateral to the cystic bronchiectasis measures maximally 9 mm on 89/4 versus 12 mm on the prior. Diffuse micronodularity is again identified, favoring smoking related respiratory bronchiolitis. The right middle lobe consolidation has resolved. Musculoskeletal: Included within the abdomen pelvic section. CT ABDOMEN PELVIS FINDINGS Hepatobiliary: Normal liver. Normal gallbladder, without biliary ductal dilatation. Pancreas: Normal, without mass or ductal dilatation. Spleen: Normal in size, without focal abnormality. Adrenals/Urinary Tract: Normal adrenal glands. Left renal cysts of up to 1.0 cm . In the absence of clinically indicated signs/symptoms require(s) no independent follow-up. Minimal right renal cortical scarring. The bladder is moderately thick walled with mucosal hyperenhancement on 108/2. Air in the nondependent bladder. Stomach/Bowel: Normal stomach, without wall thickening. Extensive colonic diverticulosis. Mild sigmoid wall thickening most likely represents muscular hypertrophy. There is minimal gas and stool like material on 103/2, superior and medial to the sigmoid lumen. This is contiguous with the bladder dome including on coronal image 58. Findings are new since 02/23/2023. Normal terminal ileum and appendix.  Normal small bowel. Vascular/Lymphatic: Advanced aortic and branch vessel atherosclerosis. No abdominopelvic adenopathy. Reproductive: Normal prostate. Other: No significant free fluid.  No free intraperitoneal air. Musculoskeletal: Grade 1-2 L5-S1 anterolisthesis. IMPRESSION: 1. Status post right upper lobectomy. 2. An area of cystic  bronchiectasis in the right lung base demonstrates nodularity within, felt to be relatively similar to 06/14/2023. This could represent superimposed infection, including fungal ball. Metastatic disease is felt less likely, especially given alteration of shaped/position today. Consider chest CT follow-up at 3 months. 3. Otherwise, no evidence of recurrent or metastatic disease. 4. Findings highly suspicious for colovesical fistula and superimposed cystitis. Likely secondary to interval episode of diverticulitis. 5. Aortic atherosclerosis (ICD10-I70.0), coronary artery atherosclerosis and emphysema (ICD10-J43.9). 6. Trace right pleural fluid 7. Persistent filling defect along the distal Port-A-Cath, which could represent adherent thrombus. These results will be called to the ordering clinician or representative by the Radiologist Assistant, and communication documented in the PACS or [employer]. Electronically Signed   By: Jeronimo Greaves M.D.   On: 09/03/2023 10:07   MR PROSTATE W WO CONTRAST Result Date: 07/31/2023 CLINICAL DATA:  Elevated PSA level.  R97.20 EXAM: MR PROSTATE WITHOUT AND WITH CONTRAST TECHNIQUE: Multiplanar multisequence MRI images were obtained of the pelvis centered about the prostate. Pre and post contrast images were obtained. CONTRAST:  6mL GADAVIST GADOBUTROL 1 MMOL/ML IV SOLN COMPARISON:  CT pelvis 02/23/2023 FINDINGS: Prostate: Region of interest # along: PI-RADS category 3 lesion of the right posterolateral peripheral zone in the base and mid gland with involvement of the right posteromedial peripheral zone in the mid gland, demonstrating reduced T2 signal (image 39, series 9) corresponding to reduced ADC map activity (image 13 series 7). This measures 0.72 cc (1.7 by 0.8 by 1.1 cm). Region of interest # 2: PI-RADS category 3 lesion of the left posterolateral peripheral zone in mid gland and apex with involvement of the anterior peripheral zone in the apex, demonstrating reduced T2  signal (image 45 series 9) corresponding to reduced ADC map activity (image 14 series 7). This measures 0.27 cc (1.1 by 0.4 by 0.8 cm). Volume: 3D volumetric analysis: Prostate volume 16.6 cc (4.4 by 2.8 by 3.0 cm). Transcapsular spread: Absent Seminal vesicle involvement: Absent Neurovascular bundle involvement: Absent Pelvic adenopathy: Absent Bone metastasis: Absent Other findings: Sigmoid colon diverticulosis. Substantial wall thickening along a 5.4 cm segment of the sigmoid colon on image 14 series 3, possibly related to the underlying diverticulosis although correlation with colon cancer screening is recommended to exclude the possibility of malignancy. There also seems to be a large diverticulum from the sigmoid colon or a separate loop of small bowel extending along the left bladder roof for example on image 9 through 15 of series 5. IMPRESSION: 1. Two PI-RADS category 3 lesions are identified in the peripheral zone. Targeting data sent to [facility]. 2. Sigmoid colon diverticulosis. Substantial wall thickening along a 5.4 cm segment of the sigmoid colon, possibly related to the underlying diverticulosis although tumor could appear similarly. Correlation with the patient's colon cancer screening history is recommended. If screening is not up-to-date, appropriate screening should be considered. 3. There also seems to be a large diverticulum from the sigmoid colon or a separate loop of small bowel extending along the left bladder roof. Electronically Signed   By: Gaylyn Rong M.D.   On: 07/31/2023 12:34   DG Chest Port 1 View Result Date: 07/05/2023 CLINICAL DATA:  Shortness of breath. EXAM: PORTABLE CHEST 1 VIEW COMPARISON:  Chest radiograph dated  06/29/2023. FINDINGS: Left-sided Port-A-Cath with tip over central SVC. Postsurgical changes of the right hilum and areas of scarring in the right lower lung field. Left mid to lower lung field reticulonodular densities slightly progressed since the prior  radiograph and may represent developing infiltrate. No large pleural effusion. No pneumothorax. Stable cardiomediastinal silhouette. No acute osseous pathology. IMPRESSION: Slight progression of left mid to lower lung field reticulonodular densities may represent developing infiltrate. Electronically Signed   By: Elgie Collard M.D.   On: 07/05/2023 21:00   ECHOCARDIOGRAM COMPLETE Result Date: 06/30/2023    ECHOCARDIOGRAM REPORT   Patient Name:   BRENN DEZIEL Date of Exam: 06/30/2023 Medical Rec #:  [id number]      Height:       73.0 in Accession #:    [id number]     Weight:       147.3 lb Date of Birth:  [birth year]       BSA:          1.889 m Patient Age:    57 years       BP:           162/106 mmHg Patient Gender: M              HR:           97 bpm. Exam Location:  [facility] Procedure: 2D Echo Indications:     CHF I50.31  History:         Patient has prior history of Echocardiogram examinations, most                  recent 02/04/2022.  Sonographer:     Overton Mam RDCS, FASE Referring Phys:  [id number] Emeline General Diagnosing Phys: Windell Norfolk IMPRESSIONS  1. Left ventricular ejection fraction, by estimation, is 65 to 70%. The left ventricle has normal function. The left ventricle has no regional wall motion abnormalities. There is moderate concentric left ventricular hypertrophy. Left ventricular diastolic parameters are indeterminate. There is the interventricular septum is flattened in systole and diastole, consistent with right ventricular pressure and volume overload.  2. Right ventricular systolic function is mildly reduced. The right ventricular size is moderately enlarged. There is severely elevated pulmonary artery systolic pressure. The estimated right ventricular systolic pressure is 79.0 mmHg.  3. Right atrial size was severely dilated.  4. A small pericardial effusion is present. The pericardial effusion is circumferential. There is no evidence of cardiac tamponade.  5. The mitral valve is  normal in structure. Trivial mitral valve regurgitation.  6. Tricuspid valve regurgitation is severe.  7. The aortic valve is tricuspid. Aortic valve regurgitation is not visualized.  8. The inferior vena cava is dilated in size with <50% respiratory variability, suggesting right atrial pressure of 15 mmHg. FINDINGS  Left Ventricle: Left ventricular ejection fraction, by estimation, is 65 to 70%. The left ventricle has normal function. The left ventricle has no regional wall motion abnormalities. The left ventricular internal cavity size was small. There is moderate  concentric left ventricular hypertrophy. The interventricular septum is flattened in systole and diastole, consistent with right ventricular pressure and volume overload. Left ventricular diastolic parameters are indeterminate. Right Ventricle: The right ventricular size is moderately enlarged. No increase in right ventricular wall thickness. Right ventricular systolic function is mildly reduced. There is severely elevated pulmonary artery systolic pressure. The tricuspid regurgitant velocity is 4.00 m/s, and with an assumed right atrial pressure of 15 mmHg, the estimated right ventricular systolic pressure is  79.0 mmHg. Left Atrium: Left atrial size was normal in size. Right Atrium: Right atrial size was severely dilated. Pericardium: A small pericardial effusion is present. The pericardial effusion is circumferential. There is no evidence of cardiac tamponade. Mitral Valve: The mitral valve is normal in structure. Trivial mitral valve regurgitation. Tricuspid Valve: The tricuspid valve is normal in structure. Tricuspid valve regurgitation is severe. Aortic Valve: The aortic valve is tricuspid. Aortic valve regurgitation is not visualized. Aortic valve peak gradient measures 6.5 mmHg. Pulmonic Valve: The pulmonic valve was not well visualized. Pulmonic valve regurgitation is trivial. Aorta: The aortic root and ascending aorta are structurally normal,  with no evidence of dilitation. Venous: The inferior vena cava is dilated in size with less than 50% respiratory variability, suggesting right atrial pressure of 15 mmHg. IAS/Shunts: The atrial septum is grossly normal.  LEFT VENTRICLE PLAX 2D LVIDd:         3.40 cm   Diastology LVIDs:         2.40 cm   LV e' medial:    8.86 cm/s LV PW:         1.50 cm   LV E/e' medial:  7.8 LV IVS:        1.40 cm   LV e' lateral:   10.38 cm/s LVOT diam:     1.80 cm   LV E/e' lateral: 6.7 LV SV:         48 LV SV Index:   25 LVOT Area:     2.54 cm  RIGHT VENTRICLE RV Basal diam:  4.30 cm LEFT ATRIUM           Index       RIGHT ATRIUM           Index LA diam:      3.10 cm 1.64 cm/m  RA Area:     27.50 cm LA Vol (A4C): 11.5 ml 6.09 ml/m  RA Volume:   103.00 ml 54.52 ml/m  AORTIC VALVE                 PULMONIC VALVE AV Area (Vmax): 2.50 cm     PV Vmax:       0.99 m/s AV Vmax:        127.00 cm/s  PV Peak grad:  4.0 mmHg AV Peak Grad:   6.5 mmHg LVOT Vmax:      125.00 cm/s LVOT Vmean:     72.600 cm/s LVOT VTI:       0.187 m                              PULMONARY ARTERY AORTA                        MPA diam:        2.50 cm Ao Root diam: 3.80 cm Ao Asc diam:  3.10 cm MITRAL VALVE                TRICUSPID VALVE MV Area (PHT): 6.17 cm     TV Peak grad:   56.2 mmHg MV Decel Time: 123 msec     TV Vmax:        3.75 m/s MV E velocity: 69.45 cm/s   TR Peak grad:   64.0 mmHg MV A velocity: 103.85 cm/s  TR Vmax:        400.00 cm/s MV E/A ratio:  0.67  SHUNTS                             Systemic VTI:  0.19 m                             Systemic Diam: 1.80 cm Windell Norfolk Electronically signed by Windell Norfolk Signature Date/Time: 06/30/2023/1:32:09 PM    Final    DG Chest 2 View Result Date: 06/29/2023 CLINICAL DATA:  Shortness of breath for 2 weeks. EXAM: CHEST - 2 VIEW COMPARISON:  X-ray and CT angiogram 06/14/2023. FINDINGS: Stable left subclavian chest port with the tip along the central SVC. Persistent  masslike thickening and distortion right perihilar as well as a small right pleural effusion. Left lung is grossly clear. Chronic lung changes. No pneumothorax. Enlarged cardiopericardial silhouette. Overlapping cardiac leads. Film is under penetrated. IMPRESSION: No significant interval change when adjusting for technique. Electronically Signed   By: Karen Kays M.D.   On: 06/29/2023 13:17   US ABDOMEN LIMITED RUQ (LIVER/GB) Result Date: 06/29/2023 CLINICAL DATA:  Swelling EXAM: ULTRASOUND ABDOMEN LIMITED RIGHT UPPER QUADRANT COMPARISON:  CT 02/23/2023. FINDINGS: Gallbladder: Distended gallbladder with significant wall thickening up to 10 mm. Wall edema identified. No obvious stones. No reported sonographic Murphy's sign. Common bile duct: Diameter: 2 mm Liver: No focal lesion identified. Within normal limits in parenchymal echogenicity. Portal vein is patent on color Doppler imaging with normal direction of blood flow towards the liver. Other: None. IMPRESSION: Dilated gallbladder without stones however there is severe gallbladder wall thickening of the 10 mm and some wall edema. This is of uncertain etiology. Further workup as clinically appropriate. No ductal dilatation. Electronically Signed   By: Karen Kays M.D.   On: 06/29/2023 13:15   CT Angio Chest Pulmonary Embolism (PE) W or WO Contrast Result Date: 06/14/2023 CLINICAL DATA:  History of DVTs with shortness of breath, congestion, and wheezing EXAM: CT ANGIOGRAPHY CHEST WITH CONTRAST TECHNIQUE: Multidetector CT imaging of the chest was performed using the standard protocol during bolus administration of intravenous contrast. Multiplanar CT image reconstructions and MIPs were obtained to evaluate the vascular anatomy. RADIATION DOSE REDUCTION: This exam was performed according to the departmental dose-optimization program which includes automated exposure control, adjustment of the mA and/or kV according to patient size and/or use of iterative  reconstruction technique. CONTRAST:  75mL OMNIPAQUE IOHEXOL 350 MG/ML SOLN COMPARISON:  CTA chest dated 02/23/2023, same day chest radiograph FINDINGS: Cardiovascular: Left chest wall port terminates at the lower SVC. Irregular enhancement at the tip of the catheter (4:70). The study is high quality for the evaluation of pulmonary embolism. There are no filling defects in the central, lobar, segmental or subsegmental pulmonary artery branches to suggest acute pulmonary embolism. Main pulmonary artery measures 3.4 cm. Normal heart size. No significant pericardial fluid/thickening. Reflux of contrast material into the hepatic veins, suggesting a degree of right heart dysfunction. Coronary artery calcifications and aortic atherosclerosis. Mediastinum/Nodes: Similar rightward deviation of the mediastinum. Imaged thyroid gland without nodules meeting criteria for imaging follow-up by size. Mildly patulous esophagus. Increased size of 11 mm prevascular lymph node (4:56), previously 8 mm. Lungs/Pleura: The central airways are patent. Status post right upper lobectomy. Changes of centrilobular emphysema. Similar appearance of band-like consolidation involving the medial right middle and lower lobes with volume loss and traction bronchiectasis and bronchiolectasis in keeping with radiation changes. There is increased thickening along the  major fissure. New irregular consolidation in the right middle lobe and new mural nodule along the posterior right lower lobe cystic bronchiectasis measuring 1.5 x 0.9 cm (5:85). Increased conspicuity of previously noted nodular scarring adjacent to the cystic bronchiectasis measuring 1.2 x 0.5 cm (5:83). Increased left lung mosaic attenuation. Diffuse interlobular septal thickening. Persistent ill-defined micronodularity diffusely. No pneumothorax. Similar small right pleural effusion and thickening. Upper abdomen: Normal. Musculoskeletal: No acute or abnormal lytic or blastic osseous  lesions. Review of the MIP images confirms the above findings. IMPRESSION: 1. No evidence of pulmonary embolism. 2. New irregular consolidation in the right middle lobe and new mural nodule along the posterior right lower lobe cystic bronchiectasis measuring 1.5 x 0.9 cm. Increased conspicuity of previously noted nodular scarring adjacent to the cystic bronchiectasis measuring 1.2 x 0.5 cm. Findings are favored to represent infection/inflammation. 3. Increased size of 11 mm prevascular lymph node, likely reactive. 4. Increased left lung mosaic attenuation and interlobular septal thickening, likely pulmonary edema in the setting of right heart dysfunction and pulmonary arterial hypertension. Increased small pericardial effusion. 5. Persistent ill-defined micronodularity diffusely, which may be seen in the setting of respiratory bronchiolitis. 6. Irregular enhancement at the tip of the left chest wall port catheter, which may be related to contrast bolus timing. Recommend correlation with port function. 7. Aortic Atherosclerosis (ICD10-I70.0) and Emphysema (ICD10-J43.9). Electronically Signed   By: Agustin Cree M.D.   On: 06/14/2023 12:03   US Venous Img Lower Unilateral Left Result Date: 06/14/2023 CLINICAL DATA:  Left leg swelling EXAM: Left LOWER EXTREMITY VENOUS DOPPLER ULTRASOUND TECHNIQUE: Gray-scale sonography with compression, as well as color and duplex ultrasound, were performed to evaluate the deep venous system(s) from the level of the common femoral vein through the popliteal and proximal calf veins. COMPARISON:  None Available. FINDINGS: VENOUS Normal compressibility of the common femoral, superficial femoral, and popliteal veins, as well as the visualized calf veins. Visualized portions of profunda femoral vein and great saphenous vein unremarkable. No filling defects to suggest DVT on grayscale or color Doppler imaging. Doppler waveforms show normal direction of venous flow, normal respiratory  plasticity and response to augmentation. Limited views of the contralateral common femoral vein are unremarkable. OTHER None. Limitations: none IMPRESSION: No evidence of left lower extremity DVT. Electronically Signed   By: Karen Kays M.D.   On: 06/14/2023 10:42   DG Chest 2 View Result Date: 06/14/2023 CLINICAL DATA:  One-week history of cough, congestion, and shortness of breath EXAM: CHEST - 2 VIEW COMPARISON:  Chest radiograph dated 02/10/2022 FINDINGS: Lines/tubes: Left chest wall port tip projects over the superior cavoatrial junction. Lungs: Similar appearance of right perihilar radiation fibrosis. Mild diffuse patchy and interstitial left lung opacities. Pleura: Blunting of the right costophrenic angle.  No pneumothorax. Heart/mediastinum: Similar  cardiomediastinal silhouette. Bones: No acute osseous abnormality. IMPRESSION: 1. Mild diffuse patchy and interstitial left lung opacities, which may represent atypical infection or pulmonary edema. 2. Similar appearance of right perihilar radiation fibrosis. 3. Blunting of the right costophrenic angle, which may represent a small pleural effusion or scarring. Electronically Signed   By: Agustin Cree M.D.   On: 06/14/2023 10:05

## 2023-09-03 NOTE — Patient Instructions (Signed)
 Magnesium Sulfate Injection What is this medication? MAGNESIUM SULFATE (mag NEE zee um SUL fate) prevents and treats low levels of magnesium in your body. It may also be used to prevent and treat seizures during pregnancy in people with high blood pressure disorders, such as preeclampsia or eclampsia. Magnesium plays an important role in maintaining the health of your muscles and nervous system. This medicine may be used for other purposes; ask your health care provider or pharmacist if you have questions. What should I tell my care team before I take this medication? They need to know if you have any of these conditions: Heart disease History of irregular heart beat Kidney disease An unusual or allergic reaction to magnesium sulfate, medications, foods, dyes, or preservatives Pregnant or trying to get pregnant Breast-feeding How should I use this medication? This medication is for infusion into a vein. It is given in a hospital or clinic setting. Talk to your care team about the use of this medication in children. While this medication may be prescribed for selected conditions, precautions do apply. Overdosage: If you think you have taken too much of this medicine contact a poison control center or emergency room at once. NOTE: This medicine is only for you. Do not share this medicine with others. What if I miss a dose? This does not apply. What may interact with this medication? Certain medications for anxiety or sleep Certain medications for seizures, such phenobarbital Digoxin Medications that relax muscles for surgery Narcotic medications for pain This list may not describe all possible interactions. Give your health care provider a list of all the medicines, herbs, non-prescription drugs, or dietary supplements you use. Also tell them if you smoke, drink alcohol, or use illegal drugs. Some items may interact with your medicine. What should I watch for while using this  medication? Your condition will be monitored carefully while you are receiving this medication. You may need blood work done while you are receiving this medication. What side effects may I notice from receiving this medication? Side effects that you should report to your care team as soon as possible: Allergic reactions--skin rash, itching, hives, swelling of the face, lips, tongue, or throat High magnesium level--confusion, drowsiness, facial flushing, redness, sweating, muscle weakness, fast or irregular heartbeat, trouble breathing Low blood pressure--dizziness, feeling faint or lightheaded, blurry vision Side effects that usually do not require medical attention (report to your care team if they continue or are bothersome): Headache Nausea This list may not describe all possible side effects. Call your doctor for medical advice about side effects. You may report side effects to FDA at 1-800-FDA-1088. Where should I keep my medication? This medication is given in a hospital or clinic and will not be stored at home. NOTE: This sheet is a summary. It may not cover all possible information. If you have questions about this medicine, talk to your doctor, pharmacist, or health care provider.  2024 Elsevier/Gold Standard (2021-03-09 00:00:00)

## 2023-09-03 NOTE — Assessment & Plan Note (Signed)
 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 Observation.

## 2023-09-03 NOTE — Assessment & Plan Note (Addendum)
 07/16/2020-09/28/2020 carboplatin Taxol and Keytruda x 1  partial response -Keytruda maintenance.-04/22/2021, progression-resumed on carboplatin/Taxol/Keytruda - did not tolerate due to myelosuppression.-->  Taxol held, carboplatin/Keytruda--> CT 07/2020 progression --> 08/03/2021 started docetaxel and ramucirumab.-->confirmed to have necrotizing pneumonia pseudomonas--> chemo discontinued.  Labs are reviewed and discussed with patient. CT scan showed no signs of cancer progression. - right lung base nodularity,  Repeat CT chest in 3 months.

## 2023-09-03 NOTE — Telephone Encounter (Signed)
 Spoke to pt and informed him of CT result and MD recommendation to start Xarelto. Will mail Xarelto patient assistance enrollment form for him to complete and return back.   Please schedule:   Port flush in 6 weeks  3 months: CT chest wo  Lab/MD/ IV mag a few days after CT (bmp, mag)   I will mail AVS along with Xarelto assistance form

## 2023-09-03 NOTE — Assessment & Plan Note (Signed)
Continue port flush every 6-8 weeks.  

## 2023-09-03 NOTE — Assessment & Plan Note (Addendum)
 Unprovoked left lower extremity DVT, status post thrombolysis/thrombectomy, Nonocclusive PE  He can not afford Eliquis to 2.5 mg twice daily.- self stopped and has been off for a few week.  Switch to Xarelto 10mg  daily.

## 2023-09-03 NOTE — Assessment & Plan Note (Signed)
 Patient has been treated with antibiotics.   Recommend patient to continue follow-up with pulmonology

## 2023-09-03 NOTE — Assessment & Plan Note (Signed)
 Possibly due to recent pneumonia hospitalization.  He declined nutritionist evaluation.  Recommend nutrition supplements.

## 2023-09-03 NOTE — Telephone Encounter (Signed)
-----   Message from Rickard Patience sent at 09/03/2023  1:45 PM EST ----- Please let patient know that CT is fine except an area in right lung base which need short term follow up. Recommend CT chest wo in 3 months.  Please schedule port flush flush in 6 weeks.  Follow up with me 3 months port lab MD +/- IV Mag - check BMP mag  Need xarelto assistance.

## 2023-09-03 NOTE — Assessment & Plan Note (Signed)
 Recommend slow mag 1 tab, increase dose to twice daily IV mag 4g x 1

## 2023-09-04 ENCOUNTER — Encounter: Payer: Self-pay | Admitting: Oncology

## 2023-09-04 ENCOUNTER — Other Ambulatory Visit: Payer: Self-pay

## 2023-09-10 ENCOUNTER — Inpatient Hospital Stay
Admission: EM | Admit: 2023-09-10 | Discharge: 2023-09-12 | DRG: 062 | Disposition: A | Discharge status: Home / Self Care | Attending: Internal Medicine | Admitting: Internal Medicine

## 2023-09-10 ENCOUNTER — Inpatient Hospital Stay: Payer: Medicare Other | Attending: Oncology

## 2023-09-10 ENCOUNTER — Emergency Department

## 2023-09-10 ENCOUNTER — Inpatient Hospital Stay

## 2023-09-10 ENCOUNTER — Other Ambulatory Visit: Payer: Self-pay

## 2023-09-10 ENCOUNTER — Inpatient Hospital Stay: Payer: Medicare Other

## 2023-09-10 ENCOUNTER — Other Ambulatory Visit: Payer: Medicare Other

## 2023-09-10 VITALS — BP 120/78 | HR 87 | Temp 98.1°F | Resp 18

## 2023-09-10 DIAGNOSIS — Z7901 Long term (current) use of anticoagulants: Secondary | ICD-10-CM

## 2023-09-10 DIAGNOSIS — G893 Neoplasm related pain (acute) (chronic): Secondary | ICD-10-CM | POA: Diagnosis present

## 2023-09-10 DIAGNOSIS — Z8673 Personal history of transient ischemic attack (TIA), and cerebral infarction without residual deficits: Secondary | ICD-10-CM | POA: Diagnosis present

## 2023-09-10 DIAGNOSIS — N390 Urinary tract infection, site not specified: Secondary | ICD-10-CM | POA: Diagnosis present

## 2023-09-10 DIAGNOSIS — I6389 Other cerebral infarction: Principal | ICD-10-CM | POA: Diagnosis present

## 2023-09-10 DIAGNOSIS — I8290 Acute embolism and thrombosis of unspecified vein: Secondary | ICD-10-CM

## 2023-09-10 DIAGNOSIS — R29703 NIHSS score 3: Secondary | ICD-10-CM | POA: Diagnosis present

## 2023-09-10 DIAGNOSIS — I119 Hypertensive heart disease without heart failure: Secondary | ICD-10-CM | POA: Diagnosis present

## 2023-09-10 DIAGNOSIS — R200 Anesthesia of skin: Secondary | ICD-10-CM

## 2023-09-10 DIAGNOSIS — Z85118 Personal history of other malignant neoplasm of bronchus and lung: Secondary | ICD-10-CM

## 2023-09-10 DIAGNOSIS — Z8249 Family history of ischemic heart disease and other diseases of the circulatory system: Secondary | ICD-10-CM

## 2023-09-10 DIAGNOSIS — Z801 Family history of malignant neoplasm of trachea, bronchus and lung: Secondary | ICD-10-CM

## 2023-09-10 DIAGNOSIS — G8194 Hemiplegia, unspecified affecting left nondominant side: Secondary | ICD-10-CM | POA: Diagnosis present

## 2023-09-10 DIAGNOSIS — I639 Cerebral infarction, unspecified: Secondary | ICD-10-CM | POA: Diagnosis present

## 2023-09-10 DIAGNOSIS — R531 Weakness: Secondary | ICD-10-CM | POA: Diagnosis not present

## 2023-09-10 DIAGNOSIS — Z803 Family history of malignant neoplasm of breast: Secondary | ICD-10-CM

## 2023-09-10 DIAGNOSIS — R300 Dysuria: Secondary | ICD-10-CM | POA: Diagnosis not present

## 2023-09-10 DIAGNOSIS — R55 Syncope and collapse: Secondary | ICD-10-CM | POA: Diagnosis present

## 2023-09-10 DIAGNOSIS — E871 Hypo-osmolality and hyponatremia: Secondary | ICD-10-CM | POA: Diagnosis present

## 2023-09-10 DIAGNOSIS — Z79899 Other long term (current) drug therapy: Secondary | ICD-10-CM

## 2023-09-10 DIAGNOSIS — J439 Emphysema, unspecified: Secondary | ICD-10-CM | POA: Diagnosis present

## 2023-09-10 DIAGNOSIS — Z833 Family history of diabetes mellitus: Secondary | ICD-10-CM

## 2023-09-10 DIAGNOSIS — F1010 Alcohol abuse, uncomplicated: Secondary | ICD-10-CM | POA: Diagnosis present

## 2023-09-10 DIAGNOSIS — Z86711 Personal history of pulmonary embolism: Secondary | ICD-10-CM | POA: Diagnosis not present

## 2023-09-10 DIAGNOSIS — D6859 Other primary thrombophilia: Secondary | ICD-10-CM | POA: Diagnosis present

## 2023-09-10 DIAGNOSIS — Z87891 Personal history of nicotine dependence: Secondary | ICD-10-CM | POA: Diagnosis not present

## 2023-09-10 DIAGNOSIS — Z86718 Personal history of other venous thrombosis and embolism: Secondary | ICD-10-CM | POA: Diagnosis not present

## 2023-09-10 DIAGNOSIS — Z87442 Personal history of urinary calculi: Secondary | ICD-10-CM | POA: Diagnosis not present

## 2023-09-10 DIAGNOSIS — E860 Dehydration: Secondary | ICD-10-CM

## 2023-09-10 DIAGNOSIS — R29898 Other symptoms and signs involving the musculoskeletal system: Principal | ICD-10-CM

## 2023-09-10 DIAGNOSIS — C3491 Malignant neoplasm of unspecified part of right bronchus or lung: Secondary | ICD-10-CM | POA: Diagnosis present

## 2023-09-10 DIAGNOSIS — Z9189 Other specified personal risk factors, not elsewhere classified: Secondary | ICD-10-CM

## 2023-09-10 LAB — COMPREHENSIVE METABOLIC PANEL
ALT: 11 U/L (ref 0–44)
AST: 15 U/L (ref 15–41)
Albumin: 2.6 g/dL — ABNORMAL LOW (ref 3.5–5.0)
Alkaline Phosphatase: 102 U/L (ref 38–126)
Anion gap: 8 (ref 5–15)
BUN: 12 mg/dL (ref 6–20)
CO2: 21 mmol/L — ABNORMAL LOW (ref 22–32)
Calcium: 8.6 mg/dL — ABNORMAL LOW (ref 8.9–10.3)
Chloride: 101 mmol/L (ref 98–111)
Creatinine, Ser: 0.9 mg/dL (ref 0.61–1.24)
GFR, Estimated: >60 mL/min (ref >60)
Glucose, Bld: 123 mg/dL — ABNORMAL HIGH (ref 70–99)
Potassium: 4.5 mmol/L (ref 3.5–5.1)
Sodium: 130 mmol/L — ABNORMAL LOW (ref 135–145)
Total Bilirubin: 0.9 mg/dL (ref 0.0–1.2)
Total Protein: 6.7 g/dL (ref 6.5–8.1)

## 2023-09-10 LAB — URINE DRUG SCREEN, QUALITATIVE (ARMC ONLY)
Amphetamines, Ur Screen: NOT DETECTED
Barbiturates, Ur Screen: NOT DETECTED
Benzodiazepine, Ur Scrn: NOT DETECTED
Cannabinoid 50 Ng, Ur ~~LOC~~: NOT DETECTED
Cocaine Metabolite,Ur ~~LOC~~: NOT DETECTED
MDMA (Ecstasy)Ur Screen: NOT DETECTED
Methadone Scn, Ur: NOT DETECTED
Opiate, Ur Screen: NOT DETECTED
Phencyclidine (PCP) Ur S: NOT DETECTED
Tricyclic, Ur Screen: NOT DETECTED

## 2023-09-10 LAB — DIFFERENTIAL
Abs Immature Granulocytes: 0.07 10*3/uL (ref 0.00–0.07)
Basophils Absolute: 0.1 10*3/uL (ref 0.0–0.1)
Basophils Relative: 0 %
Eosinophils Absolute: 0.1 10*3/uL (ref 0.0–0.5)
Eosinophils Relative: 1 %
Immature Granulocytes: 0 %
Lymphocytes Relative: 7 %
Lymphs Abs: 1.1 10*3/uL (ref 0.7–4.0)
Monocytes Absolute: 1.5 10*3/uL — ABNORMAL HIGH (ref 0.1–1.0)
Monocytes Relative: 10 %
Neutro Abs: 12.7 10*3/uL — ABNORMAL HIGH (ref 1.7–7.7)
Neutrophils Relative %: 82 %

## 2023-09-10 LAB — URINALYSIS, COMPLETE (UACMP) WITH MICROSCOPIC
Bilirubin Urine: NEGATIVE
Glucose, UA: NEGATIVE mg/dL
Ketones, ur: NEGATIVE mg/dL
Nitrite: POSITIVE — AB
Protein, ur: 30 mg/dL — AB
Specific Gravity, Urine: 1.04 — ABNORMAL HIGH (ref 1.005–1.030)
pH: 5 (ref 5.0–8.0)

## 2023-09-10 LAB — APTT: aPTT: 32 s (ref 24–36)

## 2023-09-10 LAB — URINALYSIS, ROUTINE W REFLEX MICROSCOPIC
Bilirubin Urine: NEGATIVE
Glucose, UA: NEGATIVE mg/dL
Ketones, ur: NEGATIVE mg/dL
Nitrite: POSITIVE — AB
Protein, ur: 30 mg/dL — AB
RBC / HPF: >50 RBC/hpf (ref 0–5)
Specific Gravity, Urine: 1.026 (ref 1.005–1.030)
WBC, UA: >50 WBC/hpf (ref 0–5)
pH: 5 (ref 5.0–8.0)

## 2023-09-10 LAB — HEMOGLOBIN A1C
Hgb A1c MFr Bld: 4.8 % (ref 4.8–5.6)
Mean Plasma Glucose: 91.06 mg/dL

## 2023-09-10 LAB — CBC
HCT: 35.4 % — ABNORMAL LOW (ref 39.0–52.0)
Hemoglobin: 12.1 g/dL — ABNORMAL LOW (ref 13.0–17.0)
MCH: 33.6 pg (ref 26.0–34.0)
MCHC: 34.2 g/dL (ref 30.0–36.0)
MCV: 98.3 fL (ref 80.0–100.0)
Platelets: 198 10*3/uL (ref 150–400)
RBC: 3.6 MIL/uL — ABNORMAL LOW (ref 4.22–5.81)
RDW: 13.8 % (ref 11.5–15.5)
WBC: 15.6 10*3/uL — ABNORMAL HIGH (ref 4.0–10.5)
nRBC: 0 % (ref 0.0–0.2)

## 2023-09-10 LAB — MRSA NEXT GEN BY PCR, NASAL: MRSA by PCR Next Gen: NOT DETECTED

## 2023-09-10 LAB — PROTIME-INR
INR: 1.1 (ref 0.8–1.2)
Prothrombin Time: 14.6 s (ref 11.4–15.2)

## 2023-09-10 LAB — PROCALCITONIN: Procalcitonin: 0.22 ng/mL

## 2023-09-10 LAB — GLUCOSE, CAPILLARY: Glucose-Capillary: 88 mg/dL (ref 70–99)

## 2023-09-10 LAB — ETHANOL: Alcohol, Ethyl (B): <10 mg/dL (ref <10)

## 2023-09-10 LAB — CBG MONITORING, ED: Glucose-Capillary: 84 mg/dL (ref 70–99)

## 2023-09-10 LAB — MAGNESIUM: Magnesium: 1.5 mg/dL — ABNORMAL LOW (ref 1.7–2.4)

## 2023-09-10 MED ORDER — SODIUM CHLORIDE 0.9% FLUSH
10.0000 mL | Freq: Once | INTRAVENOUS | Status: AC
  Administered 2023-09-10: 10 mL via INTRAVENOUS
  Filled 2023-09-10: qty 10

## 2023-09-10 MED ORDER — LABETALOL HCL 5 MG/ML IV SOLN: 20.0000 mg | Freq: Once | INTRAVENOUS | Status: DC

## 2023-09-10 MED ORDER — SODIUM CHLORIDE 0.9 % IV SOLN
Freq: Once | INTRAVENOUS | Status: DC
  Filled 2023-09-10: qty 250

## 2023-09-10 MED ORDER — ACETAMINOPHEN 650 MG RE SUPP: 650.0000 mg | RECTAL | Status: DC | PRN

## 2023-09-10 MED ORDER — TENECTEPLASE FOR STROKE
0.2500 mg/kg | PACK | Freq: Once | INTRAVENOUS | Status: AC
  Administered 2023-09-10: 14 mg via INTRAVENOUS

## 2023-09-10 MED ORDER — SODIUM CHLORIDE 0.9 % IV SOLN: INTRAVENOUS | Status: DC

## 2023-09-10 MED ORDER — SENNOSIDES-DOCUSATE SODIUM 8.6-50 MG PO TABS: 1.0000 | ORAL_TABLET | Freq: Every evening | ORAL | Status: DC | PRN

## 2023-09-10 MED ORDER — SODIUM CHLORIDE 0.9% FLUSH: 10.0000 mL | INTRAVENOUS | Status: DC | PRN

## 2023-09-10 MED ORDER — STROKE: EARLY STAGES OF RECOVERY BOOK: Freq: Once | Status: AC

## 2023-09-10 MED ORDER — CLEVIDIPINE BUTYRATE 0.5 MG/ML IV EMUL
0.0000 mg/h | INTRAVENOUS | Status: DC
Start: 2023-09-10 — End: 2023-09-11

## 2023-09-10 MED ORDER — ACETAMINOPHEN 160 MG/5ML PO SOLN: 650.0000 mg | ORAL | Status: DC | PRN

## 2023-09-10 MED ORDER — ALTEPLASE 2 MG IJ SOLR
2.0000 mg | Freq: Once | INTRAMUSCULAR | Status: AC
  Administered 2023-09-10: 2 mg
  Filled 2023-09-10: qty 2

## 2023-09-10 MED ORDER — IOHEXOL 350 MG/ML SOLN
75.0000 mL | Freq: Once | INTRAVENOUS | Status: AC | PRN
  Administered 2023-09-10: 75 mL via INTRAVENOUS

## 2023-09-10 MED ORDER — CHLORHEXIDINE GLUCONATE CLOTH 2 % EX PADS
6.0000 | MEDICATED_PAD | Freq: Every day | CUTANEOUS | Status: DC
  Administered 2023-09-11 – 2023-09-12 (×2): 6 via TOPICAL

## 2023-09-10 MED ORDER — MAGNESIUM SULFATE 2 GM/50ML IV SOLN: 2.0000 g | Freq: Once | INTRAVENOUS | Status: DC

## 2023-09-10 MED ORDER — PANTOPRAZOLE SODIUM 40 MG IV SOLR
40.0000 mg | Freq: Every day | INTRAVENOUS | Status: DC
Start: 2023-09-10 — End: 2023-09-12
  Administered 2023-09-10 – 2023-09-11 (×2): 40 mg via INTRAVENOUS
  Filled 2023-09-10 (×2): qty 10

## 2023-09-10 MED ORDER — TENECTEPLASE FOR STROKE: 0.2500 mg/kg | PACK | Freq: Once | INTRAVENOUS | Status: DC

## 2023-09-10 MED ORDER — SODIUM CHLORIDE 0.9% FLUSH
10.0000 mL | Freq: Two times a day (BID) | INTRAVENOUS | Status: DC
  Administered 2023-09-10 – 2023-09-12 (×4): 10 mL

## 2023-09-10 MED ORDER — ACETAMINOPHEN 325 MG PO TABS: 650.0000 mg | ORAL_TABLET | ORAL | Status: DC | PRN

## 2023-09-10 NOTE — Progress Notes (Signed)
 OT Cancellation Note  Patient Details Name: Samuel Choi MRN: [id number] DOB: [birth year]   Cancelled Treatment:    Reason Eval/Treat Not Completed: Patient not medically ready. Consult received and chart reviewed.  Patient admitted for CVA work up with TNK administration on 09/10/23 at 11:41. Per guidelines, to be on strict bedrest x24 hours post infusion and follow up imaging required to be completed prior to initiation of therapy.  Will continue to follow and initiate as medically appropriate.    Glenard Haring M.S. OTR/L  09/10/23, 2:28 PM

## 2023-09-10 NOTE — ED Notes (Signed)
 Charge RN called for room for code stroke.

## 2023-09-10 NOTE — Plan of Care (Signed)

## 2023-09-10 NOTE — ED Notes (Signed)
 While triaging patient, this RN noticed patient has no movement to left arm. Patient states "that started while I was in the cancer center, that's why I'm over here". Patient reports his last normal was 0935.

## 2023-09-10 NOTE — ED Notes (Signed)
 Received call from Cancer center at this time to give report. Per RN the pt was there for his mag infusion given it was low. Pt had his port accessed and then at some point he went unresponsive with them and was staring off. RN stated she was trying to get back to call wife and let her know what was going on.   RN also stated they had accessed his port but did not get blood return and had issues with it last week so doc wanted them to take it out.

## 2023-09-10 NOTE — ED Provider Notes (Signed)
 [facility] Provider Note    Event Date/Time   First MD Initiated Contact with Patient 09/10/23 1134     (approximate)   History   Near Syncope   HPI  Samuel Choi is a 58 y.o. male  who presents to the emergency department today from the cancer center because of concern for syncopal episode. The patient was there to get a magnesium infusion although it had not yet happened. The patient then passed out. Also complaining of decreased movement to the left arm. Patient was called a code stroke from triage and seen by neurology and given TNK in CT.       Physical Exam   Triage Vital Signs: ED Triage Vitals  Encounter Vitals Group     BP 09/10/23 1121 105/80     Systolic BP Percentile --      Diastolic BP Percentile --      Pulse Rate 09/10/23 1121 86     Resp 09/10/23 1121 20     Temp --      Temp src --      SpO2 --      Weight --      Height --      Head Circumference --      Peak Flow --      Pain Score 09/10/23 1117 0     Pain Loc --      Pain Education --      Exclude from Growth Chart --     Most recent vital signs: Vitals:   09/10/23 1121  BP: 105/80  Pulse: 86  Resp: 20   General: Awake, alert, oriented. CV:  Good peripheral perfusion. Regular rate and rhythm. Resp:  Normal effort. Lungs clear. Abd:  No distention.    ED Results / Procedures / Treatments   Labs (all labs ordered are listed, but only abnormal results are displayed) Labs Reviewed  CBC - Abnormal; Notable for the following components:      Result Value   WBC 15.6 (*)    RBC 3.60 (*)    Hemoglobin 12.1 (*)    HCT 35.4 (*)    All other components within normal limits  DIFFERENTIAL - Abnormal; Notable for the following components:   Neutro Abs 12.7 (*)    Monocytes Absolute 1.5 (*)    All other components within normal limits  COMPREHENSIVE METABOLIC PANEL - Abnormal; Notable for the following components:   Sodium 130 (*)    CO2 21 (*)    Glucose,  Bld 123 (*)    Calcium 8.6 (*)    Albumin 2.6 (*)    All other components within normal limits  MRSA NEXT GEN BY PCR, NASAL  ETHANOL  PROTIME-INR  APTT  GLUCOSE, CAPILLARY  URINE DRUG SCREEN, QUALITATIVE ([facility] ONLY)  URINALYSIS, ROUTINE W REFLEX MICROSCOPIC  HEMOGLOBIN A1C  URINALYSIS, COMPLETE (UACMP) WITH MICROSCOPIC  CBG MONITORING, ED     EKG  I, Phineas Semen, attending physician, personally viewed and interpreted this EKG  EKG Time: 1201 Rate: 80 Rhythm: normal sinus rhythm Axis: right axis deviation Intervals: qtc 461 QRS: RBBB ST changes: no st elevation Impression: abnormal ekg    RADIOLOGY I independently interpreted and visualized the CT head. My interpretation: No ICH Radiology interpretation:  IMPRESSION:  1.  No evidence of an acute intracranial abnormality.  2. Parenchymal atrophy and chronic small vessel ischemic disease.  3. Paranasal sinus disease at the imaged levels, as described.  PROCEDURES:  Critical Care performed: Yes  CRITICAL CARE Performed by: Phineas Semen   Total critical care time: 20 minutes  Critical care time was exclusive of separately billable procedures and treating other patients.  Critical care was necessary to treat or prevent imminent or life-threatening deterioration.  Critical care was time spent personally by me on the following activities: development of treatment plan with patient and/or surrogate as well as nursing, discussions with consultants, evaluation of patient's response to treatment, examination of patient, obtaining history from patient or surrogate, ordering and performing treatments and interventions, ordering and review of laboratory studies, ordering and review of radiographic studies, pulse oximetry and re-evaluation of patient's condition.   Procedures    MEDICATIONS ORDERED IN ED: Medications  tenecteplase (TNKASE) injection for Stroke 14 mg (has no administration in time range)   tenecteplase (TNKASE) injection for Stroke 14 mg (has no administration in time range)     IMPRESSION / MDM / ASSESSMENT AND PLAN / ED COURSE  I reviewed the triage vital signs and the nursing notes.                              Differential diagnosis includes, but is not limited to, CVA, TIA, electrolyte abnormality, arrhythmia.  Patient's presentation is most consistent with acute presentation with potential threat to life or bodily function.   The patient is on the cardiac monitor to evaluate for evidence of arrhythmia and/or significant heart rate changes.  Patient presented to the emergency department today because of concerns for syncopal episode that occurred at cancer center.  However during triage.  The patient also mentioned that he could not move his left upper arm.  A code stroke was called.  Patient was evaluated by Dr. Wilford Corner with neurology.  TNK was given.  Because of this patient was admitted to the ICU.      FINAL CLINICAL IMPRESSION(S) / ED DIAGNOSES   Final diagnoses:  Left arm weakness  Syncope, unspecified syncope type      Note:  This document was prepared using Dragon voice recognition software and may include unintentional dictation errors.    Phineas Semen, MD 09/10/23 [phone]

## 2023-09-10 NOTE — ED Notes (Signed)
 Pt completed MRI screening at this time.

## 2023-09-10 NOTE — ED Notes (Signed)
 Misty Stanley Secretary called to get code stroke info and provide room number.

## 2023-09-10 NOTE — ED Notes (Addendum)
 Attempted to give report to ICU at this time. Gave phone number for call back.

## 2023-09-10 NOTE — Progress Notes (Signed)
   09/10/23 1122  Spiritual Encounters  Type of Visit Initial  Care provided to: Patient  Conversation partners present during encounter Nurse  Referral source Code page  Reason for visit Code  OnCall Visit Yes  Interventions  Spiritual Care Interventions Made Established relationship of care and support;Compassionate presence  Intervention Outcomes  Outcomes Connection to spiritual care;Awareness around self/spiritual resourses  Spiritual Care Plan  Spiritual Care Issues Still Outstanding No further spiritual care needs at this time (see row info)   Called patients wife to ensure Cancer Center had reached her and let patient know she was on the way

## 2023-09-10 NOTE — H&P (Signed)
 NAME:  Samuel Choi, MRN:  [id number], DOB:  1965-12-25, LOS: 0 ADMISSION DATE:  09/10/2023, CONSULTATION DATE:  09/10/23 REFERRING MD:  Dr. Wilford Corner, CHIEF COMPLAINT:  Acute onset Left sided weakness   Brief Pt Description / Synopsis:  58 y.o. male with PMHx of squamous cell lung cancer, COPD, DVT and PE (supposed to be on Xarelto but reports not taking it due to affordability issues) who presented from [facility] with syncope and left sided weakness/sensory loss.  Concern for Acute Ischemic CVA, status post TNK.  History of Present Illness:  Samuel Choi is a 58 year old male with a past medical history significant for squamous cell lung cancer, DVT and PE supposed to be on Xarelto (but reports not taking it due to affordability issues), and COPD who presented to [facility] ED on 10/07/2023 from the cancer center due to syncopal episode.  While in ER triage, he was noted to have left-sided weakness of both the arm and leg, of which code stroke was initiated.  Upon Neurology's evaluation he continued to have left arm and left leg weakness, with arm drift and left-sided sensory loss.  Stat CT Head showed no acute bleed.  Decision was made to proceed with TNK administration.  ED Course: Initial Vital Signs: Temperature 98.1 F orally, pulse 86, respiratory rate 20, blood pressure 105/80, SpO2 100% on room air Significant Labs: Sodium 130, bicarb 21, glucose 123, WBC 15.6, hemoglobin 12.1, hematocrit 35.4, ethyl alcohol less than 10 Urinalysis is currently pending Imaging Chest X-ray>> pending Medications Administered: TNK @ 11:41  PCCM asked to admit for further workup and treatment.  Please see "Significant Hospital Events" section below for full detailed hospital course.   Pertinent  Medical History   Past Medical History:  Diagnosis Date   Alcohol abuse    usually drinks 2-3 drinks per day   Atherosclerosis 06/2018   Cancer associated pain    Chronic sinusitis    Dehydration 02/07/2019    Emphysema of lung (HCC) 06/2018   patient unaware of this.   Hip fracture (HCC) 05/2018   no surgery   History of kidney stones 05/2018   per xray, bilateral nephrolitiasis   Hypertension    Squamous cell carcinoma of lung, right (HCC) 06/2018    Micro Data:  N/A  Antimicrobials:   Anti-infectives (From admission, onward)    None       Significant Hospital Events: Including procedures, antibiotic start and stop dates in addition to other pertinent events   3/3: Presented to ED from Cancer center due to syncope and left sided weakness.  Code Stroke initiated, Neurology consulted, received TNK at 11:41  Interim History / Subjective:  -Seen in ED after administration of TNK -Left sided weakness has resolved -Hemodynamically stable, no severe hypertension requiring antihypertensives currently -Admit to ICU for close monitoring and neuro assessments per post thrombolytic protocol  Objective   Blood pressure 115/78, pulse 79, resp. rate 20, SpO2 97%.       No intake or output data in the 24 hours ending 09/10/23 1233 There were no vitals filed for this visit.  Examination: General: Acute on chronically ill-appearing frail elderly male, sitting in bed, on room air, no acute distress HENT: Atraumatic, normocephalic, neck supple, no JVD Lungs: Clear breath sounds throughout, even, nonlabored, normal effort Cardiovascular: Regular rate and rhythm, S1-S2, no murmurs, rubs, gallops Abdomen: Soft, nontender, nondistended, no guarding or tenderness, bowel sounds positive x 4 Extremities: Normal bulk and tone, no deformities, no edema Neuro:  Awake and alert, oriented x 4, moves all extremities to commands, no focal deficits (left-sided weakness has resolved), pupils PERRLA GU: Deferred  Resolved Hospital Problem list     Assessment & Plan:   #Acute Left sided weakness concerning for Acute Ischemic CVA, s/p TNK on 3/3 @ 11:41 am -CT Head on presentation negative for acute  intracranial abnormality -CTa Head & Neck: 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 (age indeterminate), otherwise no other LVO -ICU monitoring -Frequent neurochecks and NIHSS scores per post-TNK protocol -Neurology following, appreciate input -Obtain stat head CT for any change in neurological exam to rule out hemorrhagic conversion -MRI brain at 24 hrs post TNK -No antiplatelets or anticoagulation for 24 hours post TNKase, and until ICH ruled out on repeat imaging at 24 hours -Maintain SBP <180 and/or DBP <105 -As needed labetalol and nicardipine drip if needed to maintain blood pressure goal -SCDs for DVT prophylaxis -N.p.o., bedside swallow study pending -PT/OT/speech therapy consult -Check hemoglobin A1c and fasting lipid panel -Echocardiogram is pending  #Leukocytosis, likely reactive -Monitor fever curve -Trend WBC's  -Follow cultures as above -Obtain Chest X-ray and Urinalysis -Hold off for now on empiric ABX pending cultures & sensitivities  #Mild Hyponatremia -Monitor I&O's / urinary output -Follow BMP -Ensure adequate renal perfusion -Avoid nephrotoxic agents as able -Replace electrolytes as indicated ~ Pharmacy following for assistance with electrolyte replacement -IV fluids      Best Practice (right click and "Reselect all SmartList Selections" daily)   Diet/type: Regular consistency (see orders) DVT prophylaxis: SCD GI prophylaxis: PPI Lines: Chest port accessed and is still needed Foley:  N/A Code Status:  full code Last date of multidisciplinary goals of care discussion [N/A]  3/3: Pt updated at bedside on plan of care.  Labs   CBC: Recent Labs  Lab 09/10/23 1206  WBC 15.6*  NEUTROABS 12.7*  HGB 12.1*  HCT 35.4*  MCV 98.3  PLT 198    Basic Metabolic Panel: Recent Labs  Lab 09/10/23 0941  MG 1.5*   GFR: Estimated Creatinine Clearance: 64.5 mL/min (by C-G formula based on SCr of 0.99 mg/dL). Recent Labs  Lab 09/10/23 1206  WBC  15.6*    Liver Function Tests: No results for input(s): "AST", "ALT", "ALKPHOS", "BILITOT", "PROT", "ALBUMIN" in the last 168 hours. No results for input(s): "LIPASE", "AMYLASE" in the last 168 hours. No results for input(s): "AMMONIA" in the last 168 hours.  ABG    Component Value Date/Time   PHART 7.47 (H) 08/01/2018 0715   PCO2ART 32 08/01/2018 0715   PO2ART 88 08/01/2018 0715   HCO3 38.5 (H) 07/05/2023 2133   O2SAT 77.6 07/05/2023 2133     Coagulation Profile: No results for input(s): "INR", "PROTIME" in the last 168 hours.  Cardiac Enzymes: No results for input(s): "CKTOTAL", "CKMB", "CKMBINDEX", "TROPONINI" in the last 168 hours.  HbA1C: Hemoglobin A1C  Date/Time Value Ref Range Status  07/04/2014 03:04 PM 4.4 4.2 - 6.3 % Final    Comment:    The American Diabetes Association recommends that a primary goal of therapy should be <7% and that physicians should reevaluate the treatment regimen in patients with HbA1c values consistently >8%.    Hgb A1c MFr Bld  Date/Time Value Ref Range Status  01/25/2022 10:54 AM 4.8 4.8 - 5.6 % Final    Comment:    (NOTE) Pre diabetes:          5.7%-6.4%  Diabetes:              >  6.4%  Glycemic control for   <7.0% adults with diabetes     CBG: Recent Labs  Lab 09/10/23 1119  GLUCAP 84    Review of Systems:   Positives in BOLD: Gen: Denies fever, chills, weight change, fatigue, night sweats HEENT: Denies blurred vision, double vision, hearing loss, tinnitus, sinus congestion, rhinorrhea, sore throat, neck stiffness, dysphagia PULM: Denies shortness of breath, cough, sputum production, hemoptysis, wheezing CV: Denies chest pain, edema, orthopnea, paroxysmal nocturnal dyspnea, palpitations GI: Denies abdominal pain, nausea, vomiting, diarrhea, hematochezia, melena, constipation, change in bowel habits GU: Denies dysuria, hematuria, polyuria, oliguria, urethral discharge Endocrine: Denies hot or cold intolerance, polyuria,  polyphagia or appetite change Derm: Denies rash, dry skin, scaling or peeling skin change Heme: Denies easy bruising, bleeding, bleeding gums Neuro: Denies headache, numbness, weakness, slurred speech, loss of memory or consciousness   Past Medical History:  He,  has a past medical history of Alcohol abuse, Atherosclerosis (06/2018), Cancer associated pain, Chronic sinusitis, Dehydration (02/07/2019), Emphysema of lung (HCC) (06/2018), Hip fracture (HCC) (05/2018), History of kidney stones (05/2018), Hypertension, and Squamous cell carcinoma of lung, right (HCC) (06/2018).   Surgical History:   Past Surgical History:  Procedure Laterality Date   BRAIN SURGERY  10/2017   nasal/sinus endoscopy. mass benign   ELECTROMAGNETIC NAVIGATION BROCHOSCOPY Right 06/25/2018   Procedure: ELECTROMAGNETIC NAVIGATION BRONCHOSCOPY;  Surgeon: Erin Fulling, MD;  Location: [facility] ORS;  Service: Cardiopulmonary;  Laterality: Right;   ENDOBRONCHIAL ULTRASOUND Right 06/25/2018   Procedure: ENDOBRONCHIAL ULTRASOUND;  Surgeon: Erin Fulling, MD;  Location: [facility] ORS;  Service: Cardiopulmonary;  Laterality: Right;   ENDOBRONCHIAL ULTRASOUND Right 12/26/2018   Procedure: ENDOBRONCHIAL ULTRASOUND RIGHT;  Surgeon: Erin Fulling, MD;  Location: [facility] ORS;  Service: Cardiopulmonary;  Laterality: Right;   FLEXIBLE BRONCHOSCOPY N/A 08/20/2018   Procedure: FLEXIBLE BRONCHOSCOPY PREOP;  Surgeon: Hulda Marin, MD;  Location: [facility] ORS;  Service: Thoracic;  Laterality: N/A;   IR CV LINE INJECTION  04/04/2019   NASAL SINUS SURGERY  10/2017   At [facility], frontal sinusotomy, ethmoidectomy, resection anterior cranial fossa neoplasm, turbinate resection   PERIPHERAL VASCULAR THROMBECTOMY Left 12/31/2020   Procedure: PERIPHERAL VASCULAR THROMBECTOMY;  Surgeon: Annice Needy, MD;  Location: [facility] INVASIVE CV LAB;  Service: Cardiovascular;  Laterality: Left;   PORTACATH PLACEMENT Left 01/15/2019   Procedure: INSERTION PORT-A-CATH;  Surgeon: Hulda Marin, MD;  Location: [facility] ORS;  Service: General;  Laterality: Left;   THORACOTOMY Right 08/13/2018   Procedure: PREOP BROCHOSCOPY WITH RIGHT THORACOTOMY AND RUL RESECTION;  Surgeon: Hulda Marin, MD;  Location: [facility] ORS;  Service: General;  Laterality: Right;   THORACOTOMY Right 08/20/2018   Procedure: THORACOTOMY MAJOR RIGHT UPPER LOBE LOBECTOMY;  Surgeon: Hulda Marin, MD;  Location: [facility] ORS;  Service: Thoracic;  Laterality: Right;   TOE SURGERY Left    pin in left toe     Social History:   reports that he quit smoking about 5 years ago. His smoking use included cigarettes. He started smoking about 20 years ago. He has a 7.5 pack-year smoking history. He has never used smokeless tobacco. He reports current alcohol use of about 3.0 standard drinks of alcohol per week. He reports that he does not use drugs.   Family History:  His family history includes Breast cancer in his mother; Diabetes in his mother; Hypertension in his father; Lung cancer in his father.   Allergies No Known Allergies   Home Medications  Prior to Admission medications   Medication Sig Start Date End Date  Taking? Authorizing Provider  albuterol (PROVENTIL) (2.5 MG/3ML) 0.083% nebulizer solution Take 3 mLs (2.5 mg total) by nebulization every 6 (six) hours as needed for wheezing or shortness of breath. 06/15/23   Leeroy Bock, MD  albuterol (VENTOLIN HFA) 108 (90 Base) MCG/ACT inhaler INHALE 2 PUFFS BY MOUTH EVERY 6 HOURS AS NEEDED FOR WHEEZING OR SHORTNESS OF BREATH 06/15/23   Leeroy Bock, MD  benzonatate (TESSALON) 200 MG capsule Take 1 capsule (200 mg total) by mouth 3 (three) times daily as needed for cough. 07/09/23   Tresa Moore, MD  Budeson-Glycopyrrol-Formoterol (BREZTRI AEROSPHERE) 160-9-4.8 MCG/ACT AERO Inhale 2 puffs into the lungs in the morning and at bedtime. 06/15/23   Leeroy Bock, MD  cloNIDine (CATAPRES) 0.1 MG tablet Take 1 tablet (0.1 mg total) by mouth 3 (three) times  daily. 06/15/23 06/14/24  Leeroy Bock, MD  cyanocobalamin (VITAMIN B12) 1000 MCG tablet Take 1 tablet (1,000 mcg total) by mouth daily. 06/27/22   Borders, Daryl Eastern, NP  diltiazem (CARDIZEM CD) 120 MG 24 hr capsule Take 120 mg by mouth daily. 06/19/19   [provider]  feeding supplement (ENSURE ENLIVE / ENSURE PLUS) LIQD Take 237 mLs by mouth 3 (three) times daily between meals. 02/05/22   Sunnie Nielsen, DO  furosemide (LASIX) 40 MG tablet Take 1 tablet (40 mg total) by mouth daily as needed for fluid or edema. 07/01/23 06/30/24  Amin, Ankit C, MD  latanoprost (XALATAN) 0.005 % ophthalmic solution Place 1 drop into both eyes at bedtime. 05/04/21   [provider]  loratadine (CLARITIN) 10 MG tablet TAKE 1 TABLET(10 MG) BY MOUTH DAILY AS NEEDED FOR ALLERGIES 08/13/23   Rickard Patience, MD  magnesium chloride (SLOW-MAG) 64 MG TBEC SR tablet Take 1 tablet (64 mg total) by mouth 2 (two) times daily. 09/03/23   Rickard Patience, MD  metoprolol succinate (TOPROL-XL) 25 MG 24 hr tablet Take 1 tablet (25 mg total) by mouth daily. 11/28/22   Rickard Patience, MD  Multiple Vitamin (MULTIVITAMIN WITH MINERALS) TABS tablet Take 1 tablet by mouth daily. 02/05/22   Sunnie Nielsen, DO  pantoprazole (PROTONIX) 20 MG tablet TAKE 1 TABLET BY MOUTH DAILY 02/23/23   Rickard Patience, MD  potassium chloride SA (KLOR-CON M) 20 MEQ tablet Take 1 tablet (20 mEq total) by mouth daily. 10/26/22   Rickard Patience, MD  rivaroxaban (XARELTO) 10 MG TABS tablet Take 1 tablet (10 mg total) by mouth daily. 09/03/23   Rickard Patience, MD     Critical care time: 55 minutes     Harlon Ditty, AGACNP-BC  Pulmonary & Critical Care Prefer epic messenger for cross cover needs If after hours, please call E-link

## 2023-09-10 NOTE — Progress Notes (Signed)
 1010 Patient was here for IV Mg today acessed port no blood return instilled Cath flow per MD was notifed.1040  Checked 30 min later still no blood return. Patient had a few seconds where he dazed out. Asked any symptoms none reported other then unable to lift left arm which he was able to prior to cathflo administration. No blood return, port deaccessed. NP called to bedside advised to transport to ED. Patient's wife called and notified. Report given to ED nurse. Patient in ED. Code stroke called.

## 2023-09-10 NOTE — Progress Notes (Signed)
 Code stroke activated at 1123 in CT. Dr. Wilford Corner in CT at bedside at 1125. Pt LKWT 0945, presents with left arm weakness and sensory changes. Dr. Wilford Corner discussed TNK with patient, patient agreed to receive TNK. Xarelto on med list, pt states he has not taken it yet. TNK 14 mg given by bedside nurse at 1141. Pt taken back to room at 1152.

## 2023-09-10 NOTE — Progress Notes (Signed)
 SLP Cancellation Note  Patient Details Name: Samuel Choi MRN: [id number] DOB: 15-Apr-1966   Cancelled treatment:       Reason Eval/Treat Not Completed: SLP screened, no needs identified, will sign off (chart reviewed; consulted NSG; met w/ pt in room.)   Pt denied any difficulty swallowing and is currently on a regular diet; tolerates swallowing pills w/ water per NSG.  Pt conversed in conversation w/out expressive/receptive deficits noted; pt denied any speech-language deficits. Speech clear. He picked items verbally from the Menu/choice of 4-5 given verbally by SLP. A/O x4. No further skilled ST services indicated as pt appears at his baseline. Pt agreed. NSG to reconsult if any change in status while admitted.     Jerilynn Som, MS, CCC-SLP Speech Language Pathologist Rehab Services; [facility] [phone] (ascom) Lupe Handley 09/10/2023, 3:40 PM

## 2023-09-10 NOTE — Consult Note (Signed)
 NEUROLOGY CONSULT NOTE   Date of service: September 10, 2023 Patient Name: Samuel Choi MRN:  [id number] DOB:  1966/06/10 Chief Complaint: "left sided weakness" Requesting Provider: Phineas Semen, MD  History of Present Illness  Samuel Choi is a 58 y.o. male with hx of squamous cell lung ca, COPD, h/o DVT PE supposed to be on Xarelto but reports not taking it - came in for infusions at [facility] center, where he had a syncopal episode, and sent to ER for eval. In triage, noticed to have left sided weakness (arm and leg) - code stroke activated.  Seen in CT scanner, showed left arm and leg weakness with arm drift and also left sided sensory loss. CT head completed - reviewed. No bleed. Symptoms on repeat exam somewhat improved. Discussed risk/benefits/alternatives of TNK and he agreed.  Delay in TNK due to port access issues - please see RN documentation   Regarding his anticoagulation-he was supposed to be on Xarelto as he could not afford Eliquis.  He was supposed to start it sometime later in February but has not started yet.  This was confirmed with the patient by the RN and myself and he confirmed that he is NOT on anticoagulation at this time.  LKW: 0945 Modified rankin score: 0-Completely asymptomatic and back to baseline post- stroke IV Thrombolysis: Y EVT: No ELVO  NIHSS components Score: Comment  1a Level of Conscious 0[x]  1[]  2[]  3[]      1b LOC Questions 0[x]  1[]  2[]       1c LOC Commands 0[x]  1[]  2[]       2 Best Gaze 0[]  1[x]  2[]       3 Visual 0[x]  1[]  2[]  3[]      4 Facial Palsy 0[x]  1[]  2[]  3[]      5a Motor Arm - left 0[]  1[x]  2[]  3[]  4[]  UN[]    5b Motor Arm - Right 0[x]  1[]  2[]  3[]  4[]  UN[]    6a Motor Leg - Left 0[x]  1[]  2[]  3[]  4[]  UN[]    6b Motor Leg - Right 0[x]  1[]  2[]  3[]  4[]  UN[]    7 Limb Ataxia 0[x]  1[]  2[]  3[]  UN[]     8 Sensory 0[]  1[x]  2[]  UN[]      9 Best Language 0[x]  1[]  2[]  3[]      10 Dysarthria 0[x]  1[]  2[]  UN[]      11 Extinct. and Inattention 0[x]  1[]  2[]        TOTAL:  3      ROS  Comprehensive ROS performed and pertinent positives documented in HPI   Past History   Past Medical History:  Diagnosis Date   Alcohol abuse    usually drinks 2-3 drinks per day   Atherosclerosis 06/2018   Cancer associated pain    Chronic sinusitis    Dehydration 02/07/2019   Emphysema of lung (HCC) 06/2018   patient unaware of this.   Hip fracture (HCC) 05/2018   no surgery   History of kidney stones 05/2018   per xray, bilateral nephrolitiasis   Hypertension    Squamous cell carcinoma of lung, right (HCC) 06/2018    Past Surgical History:  Procedure Laterality Date   BRAIN SURGERY  10/2017   nasal/sinus endoscopy. mass benign   ELECTROMAGNETIC NAVIGATION BROCHOSCOPY Right 06/25/2018   Procedure: ELECTROMAGNETIC NAVIGATION BRONCHOSCOPY;  Surgeon: Erin Fulling, MD;  Location: [facility];  Service: Cardiopulmonary;  Laterality: Right;   ENDOBRONCHIAL ULTRASOUND Right 06/25/2018   Procedure: ENDOBRONCHIAL ULTRASOUND;  Surgeon: Erin Fulling, MD;  Location: [facility];  Service: Cardiopulmonary;  Laterality:  Right;   ENDOBRONCHIAL ULTRASOUND Right 12/26/2018   Procedure: ENDOBRONCHIAL ULTRASOUND RIGHT;  Surgeon: Erin Fulling, MD;  Location: [facility];  Service: Cardiopulmonary;  Laterality: Right;   FLEXIBLE BRONCHOSCOPY N/A 08/20/2018   Procedure: FLEXIBLE BRONCHOSCOPY PREOP;  Surgeon: Hulda Marin, MD;  Location: [facility];  Service: Thoracic;  Laterality: N/A;   IR CV LINE INJECTION  04/04/2019   NASAL SINUS SURGERY  10/2017   At [facility], frontal sinusotomy, ethmoidectomy, resection anterior cranial fossa neoplasm, turbinate resection   PERIPHERAL VASCULAR THROMBECTOMY Left 12/31/2020   Procedure: PERIPHERAL VASCULAR THROMBECTOMY;  Surgeon: Annice Needy, MD;  Location: [facility];  Service: Cardiovascular;  Laterality: Left;   PORTACATH PLACEMENT Left 01/15/2019   Procedure: INSERTION PORT-A-CATH;  Surgeon: Hulda Marin, MD;  Location: [facility];   Service: General;  Laterality: Left;   THORACOTOMY Right 08/13/2018   Procedure: PREOP BROCHOSCOPY WITH RIGHT THORACOTOMY AND RUL RESECTION;  Surgeon: Hulda Marin, MD;  Location: [facility];  Service: General;  Laterality: Right;   THORACOTOMY Right 08/20/2018   Procedure: THORACOTOMY MAJOR RIGHT UPPER LOBE LOBECTOMY;  Surgeon: Hulda Marin, MD;  Location: [facility];  Service: Thoracic;  Laterality: Right;   TOE SURGERY Left    pin in left toe    Family History: Family History  Problem Relation Age of Onset   Breast cancer Mother    Diabetes Mother    Lung cancer Father    Hypertension Father     Social History  reports that he quit smoking about 5 years ago. His smoking use included cigarettes. He started smoking about 20 years ago. He has a 7.5 pack-year smoking history. He has never used smokeless tobacco. He reports current alcohol use of about 3.0 standard drinks of alcohol per week. He reports that he does not use drugs.  No Known Allergies  Medications  No current facility-administered medications for this encounter.  Current Outpatient Medications:    albuterol (PROVENTIL) (2.5 MG/3ML) 0.083% nebulizer solution, Take 3 mLs (2.5 mg total) by nebulization every 6 (six) hours as needed for wheezing or shortness of breath., Disp: 120 mL, Rfl: 0   albuterol (VENTOLIN HFA) 108 (90 Base) MCG/ACT inhaler, INHALE 2 PUFFS BY MOUTH EVERY 6 HOURS AS NEEDED FOR WHEEZING OR SHORTNESS OF BREATH, Disp: 34 g, Rfl: 0   benzonatate (TESSALON) 200 MG capsule, Take 1 capsule (200 mg total) by mouth 3 (three) times daily as needed for cough., Disp: 20 capsule, Rfl: 0   Budeson-Glycopyrrol-Formoterol (BREZTRI AEROSPHERE) 160-9-4.8 MCG/ACT AERO, Inhale 2 puffs into the lungs in the morning and at bedtime., Disp: 10.7 g, Rfl: 0   cloNIDine (CATAPRES) 0.1 MG tablet, Take 1 tablet (0.1 mg total) by mouth 3 (three) times daily., Disp: , Rfl:    cyanocobalamin (VITAMIN B12) 1000 MCG tablet, Take 1 tablet  (1,000 mcg total) by mouth daily., Disp: 90 tablet, Rfl: 1   diltiazem (CARDIZEM CD) 120 MG 24 hr capsule, Take 120 mg by mouth daily., Disp: , Rfl:    feeding supplement (ENSURE ENLIVE / ENSURE PLUS) LIQD, Take 237 mLs by mouth 3 (three) times daily between meals., Disp: 237 mL, Rfl: 12   furosemide (LASIX) 40 MG tablet, Take 1 tablet (40 mg total) by mouth daily as needed for fluid or edema., Disp: 30 tablet, Rfl: 0   latanoprost (XALATAN) 0.005 % ophthalmic solution, Place 1 drop into both eyes at bedtime., Disp: , Rfl:    loratadine (CLARITIN) 10 MG tablet, TAKE 1 TABLET(10 MG) BY MOUTH  DAILY AS NEEDED FOR ALLERGIES, Disp: 90 tablet, Rfl: 1   magnesium chloride (SLOW-MAG) 64 MG TBEC SR tablet, Take 1 tablet (64 mg total) by mouth 2 (two) times daily., Disp: 180 tablet, Rfl: 1   metoprolol succinate (TOPROL-XL) 25 MG 24 hr tablet, Take 1 tablet (25 mg total) by mouth daily., Disp: 30 tablet, Rfl: 2   Multiple Vitamin (MULTIVITAMIN WITH MINERALS) TABS tablet, Take 1 tablet by mouth daily., Disp: , Rfl:    pantoprazole (PROTONIX) 20 MG tablet, TAKE 1 TABLET BY MOUTH DAILY, Disp: 100 tablet, Rfl: 2   potassium chloride SA (KLOR-CON M) 20 MEQ tablet, Take 1 tablet (20 mEq total) by mouth daily., Disp: 3 tablet, Rfl: 0   rivaroxaban (XARELTO) 10 MG TABS tablet, Take 1 tablet (10 mg total) by mouth daily., Disp: 30 tablet, Rfl: 3  Facility-Administered Medications Ordered in Other Encounters:    0.9 %  sodium chloride infusion, , Intravenous, Once, Samuel Patience, MD   dexamethasone (DECADRON) 10 MG/ML injection, , , ,    heparin lock flush 100 unit/mL, 500 Units, Intravenous, Once, Samuel Patience, MD   heparin lock flush 100 unit/mL, 500 Units, Intravenous, Once, Samuel Patience, MD   magnesium sulfate IVPB 2 g 50 mL, 2 g, Intravenous, Once, Samuel Patience, MD   sodium chloride flush (NS) 0.9 % injection 10 mL, 10 mL, Intravenous, PRN, Samuel Patience, MD, 10 mL at 04/04/19 0903   sodium chloride flush (NS) 0.9 % injection 10 mL,  10 mL, Intravenous, PRN, Samuel Patience, MD, 10 mL at 07/26/20 1308   sodium chloride flush (NS) 0.9 % injection 10 mL, 10 mL, Intravenous, PRN, Samuel Patience, MD, 10 mL at 07/28/21 0828   sodium chloride flush (NS) 0.9 % injection 10 mL, 10 mL, Intravenous, PRN, Samuel Patience, MD, 10 mL at 12/21/22 1308  Vitals   Vitals:   09/10/23 1121  BP: 105/80  Pulse: 86  Resp: 20    There is no height or weight on file to calculate BMI.  Physical Exam   GENERAL: Awake, alert in NAD HEENT: - Normocephalic and atraumatic, dry mm, no LN++, no Thyromegally LUNGS - Clear to auscultation bilaterally with no wheezes CV - S1S2 RRR, no m/r/g, equal pulses bilaterally. ABDOMEN - Soft, nontender, nondistended with normoactive BS NEURO:  Mental Status: AA&Ox3 Speech and Language: speech is not dysarthric.  Naming, repetition, fluency, and comprehension intact. Cranial Nerves: PERRL. Dysconjugate gaze at baseline but EOMI, visual fields full, no facial asymmetry, facial sensation intact, hearing intact, tongue/uvula/soft palate midline, normal sternocleidomastoid and trapezius muscle strength. No evidence of tongue atrophy or fibrillations Motor: LUE Drift. LLE weak 4+/5 - no drift though. RUE and RLE with no drift. Tone: is normal and bulk is normal Sensation- decreased on left hand - intact elsewhere Coordination: FTN intact bilaterally, no ataxia in BLE. Gait- deferred  Labs/Imaging/Neurodiagnostic studies  Labs from prior visits noted    Latest Ref Rng & Units 08/27/2023    9:10 AM 07/06/2023    5:05 AM 07/05/2023    8:37 PM  CBC  WBC 4.0 - 10.5 K/uL 13.9  15.7  19.1   Hemoglobin 13.0 - 17.0 g/dL 16.1  09.6  04.5   Hematocrit 39.0 - 52.0 % 44.6  40.0  43.7   Platelets 150 - 400 K/uL 216  145  158       Latest Ref Rng & Units 08/27/2023    9:10 AM 07/08/2023    3:51 AM 07/07/2023  2:33 AM  BMP  Glucose 70 - 99 mg/dL 75  161  096   BUN 6 - 20 mg/dL 12  54  50   Creatinine 0.61 - 1.24 mg/dL 0.45  4.09   8.11   Sodium 135 - 145 mmol/L 134  137  134   Potassium 3.5 - 5.1 mmol/L 4.8  3.5  3.2   Chloride 98 - 111 mmol/L 101  94  94   CO2 22 - 32 mmol/L 22  30  28    Calcium 8.9 - 10.3 mg/dL 9.1  8.8  8.3    INR  Lab Results  Component Value Date   INR 1.2 07/05/2023   APTT  Lab Results  Component Value Date   APTT 32 12/30/2020   CT Head without contrast(Personally reviewed): ASPECTS 10. No bleed.  CT angio Head and Neck with contrast(Personally reviewed): No ELVO. Occluded right vert at origin-age-indeterminate.  No other LVO.   ASSESSMENT   Samuel Choi is a 58 y.o. male with past history as above, where a syncopal episode at the cancer center where he came in for an infusion-sent for further evaluation to the ER where it was noticed that he had left arm weakness.  On my examination he had left arm weakness and sensory loss.  The symptoms were somewhat improving after the CT scan.  Discussed risk benefits and alternatives of TNK administration with the likelihood that this may be a stroke-patient agreed to proceed. He is supposed to be on anticoagulation but is not taking his Xarelto-this was double checked with him. No current labs available-prior labs did not show any contraindication to TNK administration. TNK was administered after mild delay with port access. He wil be admitted the ICU for post TNK care-PCCM primary, neurology consulting.  Impression: Acute ischemic stroke status post IV thrombolysis  RECOMMENDATIONS  Admit to ICU under PCCM. Will continue to follow him in consultation Frequent neurochecks per the post TNK protocol Frequent vitals per the post TNK protocol Telemetry MRI brain at 24 hours 2D echo A1c Lipid panel PT OT Speech therapy CODE STATUS-confirmed with the patient-prior CODE STATUS is DNR/DNI but wants to be full code.  CODE STATUS updated in the chart to full code for this admission. If there is any change in his neurological status-stat  MRI of the brain to rule out hemorrhagic transformation or bleed. Plan was discussed with Dr. Derrill Kay and patient's RN at bedside Plan was relayed to the PCCM team via secure chat-appreciate their care of the patient and medical management. Neurology will continue to follow. ______________________________________________________________________    Dene Gentry, MD [facility]   CRITICAL CARE ATTESTATION Performed by: Milon Dikes, MD Total critical care time: 40 minutes Critical care time was exclusive of separately billable procedures and treating other patients and/or supervising APPs/Residents/Students Critical care was necessary to treat or prevent imminent or life-threatening deterioration. This patient is critically ill and at significant risk for neurological worsening and/or death and care requires constant monitoring. Critical care was time spent personally by me on the following activities: development of treatment plan with patient and/or surrogate as well as nursing, discussions with consultants, evaluation of patient's response to treatment, examination of patient, obtaining history from patient or surrogate, ordering and performing treatments and interventions, ordering and review of laboratory studies, ordering and review of radiographic studies, pulse oximetry, re-evaluation of patient's condition, participation in multidisciplinary rounds and medical decision making of high complexity in the care of this patient.

## 2023-09-10 NOTE — ED Triage Notes (Signed)
 Patient states he was at the [facility] today for a Magnesium infusion and had syncopal episode. Denies hitting head, only symptom at this time is feeling lightheaded.

## 2023-09-10 NOTE — Code Documentation (Signed)
 Stroke Response Nurse Documentation Code Documentation  Samuel Choi is a 58 y.o. male arriving to [facility] via [facility] on 09/10/2023 with past medical hx of HTN, emphysema, atherosclerosis, . On No antithrombotic. Code stroke was activated by ED.   Patient from [facility] where he was LKW at 0945 and now complaining of left-sided weakness. Patient was brought over from the Cancer center for a syncopal episode. Patient was noted to have left arm and leg weakness in triage and a code stroke was activated.   Stroke team at the bedside on patient arrival. Seen by EDP after CT. Patient to CT with team. NIHSS 3, see documentation for details and code stroke times. Patient with left gaze preference , left arm weakness, and left decreased sensation on exam. The following imaging was completed:  CT Head and CTA. Patient is a candidate for IV Thrombolytic. TNK given in CT at 1141. Patient is not a candidate for IR due to no LVO, per MD.   Care Plan: every 15 min NIHSS and vital signs x 2 hours, every 30 min NIHSS  x 6 hours, every 1 hours x16 hours. Swallow screen per protocol.  Bedside handoff with ED RN Everlean Alstrom  Stroke Response RN

## 2023-09-10 NOTE — ED Notes (Signed)
 Code  stroke  called   to  carelink  at  11:20am

## 2023-09-10 NOTE — Consult Note (Signed)
 CODE STROKE- PHARMACY COMMUNICATION   Time CODE STROKE called/page received:11:22am  Time response to CODE STROKE was made (in person or via phone): in person  Time Stroke Kit retrieved from Py[employer]is (only if needed):yes; TNK given 11:41am  **Noted Xarelto on med list. Patient reports not taking any AC in over a months.**  Per Hx Apixaban->>Xarelto (patient not taking PTA)  Name of Provider/Nurse contacted:Dr Wilford Corner  Past Medical History:  Diagnosis Date   Alcohol abuse    usually drinks 2-3 drinks per day   Atherosclerosis 06/2018   Cancer associated pain    Chronic sinusitis    Dehydration 02/07/2019   Emphysema of lung (HCC) 06/2018   patient unaware of this.   Hip fracture (HCC) 05/2018   no surgery   History of kidney stones 05/2018   per xray, bilateral nephrolitiasis   Hypertension    Squamous cell carcinoma of lung, right (HCC) 06/2018   Lorrane Mccay Rodriguez-Guzman PharmD, BCPS 09/10/2023 12:10 PM

## 2023-09-10 NOTE — ED Triage Notes (Addendum)
 Pt comes with c/o syncopal episode today. Pt was brought over from [facility]. No report given.  Pt states he was fine this morning and went to Cancer center for his magnesium treatment. Pt states they accessed his port and then at some point he passed out.

## 2023-09-11 ENCOUNTER — Other Ambulatory Visit (HOSPITAL_COMMUNITY): Payer: Self-pay

## 2023-09-11 ENCOUNTER — Telehealth (HOSPITAL_COMMUNITY): Payer: Self-pay

## 2023-09-11 ENCOUNTER — Inpatient Hospital Stay
Admit: 2023-09-11 | Discharge: 2023-09-11 | Disposition: A | Discharge status: Home / Self Care | Attending: Student in an Organized Health Care Education/Training Program

## 2023-09-11 ENCOUNTER — Inpatient Hospital Stay

## 2023-09-11 DIAGNOSIS — R300 Dysuria: Secondary | ICD-10-CM

## 2023-09-11 DIAGNOSIS — I639 Cerebral infarction, unspecified: Secondary | ICD-10-CM

## 2023-09-11 DIAGNOSIS — R55 Syncope and collapse: Secondary | ICD-10-CM

## 2023-09-11 DIAGNOSIS — I8290 Acute embolism and thrombosis of unspecified vein: Secondary | ICD-10-CM

## 2023-09-11 HISTORY — DX: Cerebral infarction, unspecified: I63.9

## 2023-09-11 LAB — LIPID PANEL
Cholesterol: 104 mg/dL (ref 0–200)
HDL: 34 mg/dL — ABNORMAL LOW (ref >40)
LDL Cholesterol: 47 mg/dL (ref 0–99)
Total CHOL/HDL Ratio: 3.1 ratio
Triglycerides: 116 mg/dL (ref <150)
VLDL: 23 mg/dL (ref 0–40)

## 2023-09-11 LAB — RENAL FUNCTION PANEL
Albumin: 2.6 g/dL — ABNORMAL LOW (ref 3.5–5.0)
Anion gap: 7 (ref 5–15)
BUN: 12 mg/dL (ref 6–20)
CO2: 23 mmol/L (ref 22–32)
Calcium: 8.6 mg/dL — ABNORMAL LOW (ref 8.9–10.3)
Chloride: 100 mmol/L (ref 98–111)
Creatinine, Ser: 1.02 mg/dL (ref 0.61–1.24)
GFR, Estimated: >60 mL/min (ref >60)
Glucose, Bld: 111 mg/dL — ABNORMAL HIGH (ref 70–99)
Phosphorus: 2.8 mg/dL (ref 2.5–4.6)
Potassium: 4.1 mmol/L (ref 3.5–5.1)
Sodium: 130 mmol/L — ABNORMAL LOW (ref 135–145)

## 2023-09-11 LAB — CBC
HCT: 33 % — ABNORMAL LOW (ref 39.0–52.0)
Hemoglobin: 11.5 g/dL — ABNORMAL LOW (ref 13.0–17.0)
MCH: 34.2 pg — ABNORMAL HIGH (ref 26.0–34.0)
MCHC: 34.8 g/dL (ref 30.0–36.0)
MCV: 98.2 fL (ref 80.0–100.0)
Platelets: 190 10*3/uL (ref 150–400)
RBC: 3.36 MIL/uL — ABNORMAL LOW (ref 4.22–5.81)
RDW: 13.6 % (ref 11.5–15.5)
WBC: 13.1 10*3/uL — ABNORMAL HIGH (ref 4.0–10.5)
nRBC: 0 % (ref 0.0–0.2)

## 2023-09-11 LAB — ECHOCARDIOGRAM COMPLETE
AR max vel: 2.74 cm2
AV Area VTI: 3.49 cm2
AV Area mean vel: 2.5 cm2
AV Mean grad: 3 mmHg
AV Peak grad: 7.1 mmHg
Ao pk vel: 1.33 m/s
Area-P 1/2: 5.79 cm2
Calc EF: 57.9 %
Est EF: >75
Height: 73 in
MV VTI: 4.09 cm2
S' Lateral: 2 cm
Single Plane A2C EF: 66.2 %
Single Plane A4C EF: 55.5 %
Weight: 1915.36 [oz_av]

## 2023-09-11 LAB — HEMOGLOBIN A1C
Hgb A1c MFr Bld: 4.8 % (ref 4.8–5.6)
Mean Plasma Glucose: 91.06 mg/dL

## 2023-09-11 LAB — MAGNESIUM: Magnesium: 1.4 mg/dL — ABNORMAL LOW (ref 1.7–2.4)

## 2023-09-11 LAB — GLUCOSE, CAPILLARY: Glucose-Capillary: 105 mg/dL — ABNORMAL HIGH (ref 70–99)

## 2023-09-11 MED ORDER — ASPIRIN 81 MG PO TBEC
81.0000 mg | DELAYED_RELEASE_TABLET | Freq: Every day | ORAL | Status: DC
  Administered 2023-09-11 – 2023-09-12 (×2): 81 mg via ORAL
  Filled 2023-09-11 (×2): qty 1

## 2023-09-11 MED ORDER — APIXABAN 5 MG PO TABS
5.0000 mg | ORAL_TABLET | Freq: Two times a day (BID) | ORAL | Status: DC
  Administered 2023-09-12: 5 mg via ORAL
  Filled 2023-09-11: qty 1

## 2023-09-11 MED ORDER — MAGNESIUM SULFATE 2 GM/50ML IV SOLN
2.0000 g | Freq: Once | INTRAVENOUS | Status: AC
  Administered 2023-09-11: 2 g via INTRAVENOUS
  Filled 2023-09-11: qty 50

## 2023-09-11 MED ORDER — LORAZEPAM 2 MG/ML IJ SOLN
2.0000 mg | Freq: Once | INTRAMUSCULAR | Status: DC
  Filled 2023-09-11: qty 1

## 2023-09-11 MED ORDER — SODIUM CHLORIDE 0.9 % IV SOLN
1.0000 g | INTRAVENOUS | Status: DC
  Administered 2023-09-11 – 2023-09-12 (×2): 1 g via INTRAVENOUS
  Filled 2023-09-11 (×2): qty 10

## 2023-09-11 NOTE — Progress Notes (Signed)
 Occupational Therapy Evaluation Patient Details Name: Samuel Choi MRN: [id number] DOB: [birth year] Today's Date: 09/11/2023   History of Present Illness   58 y.o. male with PMHx of squamous cell lung cancer, COPD, DVT and PE (supposed to be on Xarelto but reports not taking it due to affordability issues) who presented from [facility] with syncope and left sided weakness/sensory loss.  Concern for Acute Ischemic CVA, status post TNK 11:14. (Simultaneous filing. User may not have seen previous data.)     Clinical Impressions Pt seen for OT evaluation this date. Prior to hospital admission, pt was independent in all aspects of ADL/IADL, no DME use for mobility. Pt lives with his spouse in a 2 story home with 2 steps to enter. Currently pt reporting symptoms have resolved. Pt demonstrates MODI-CGA assistance to perform OOB mobility and ADLs. Pt donned LB dressing while seated on EOB utilizing figure 4 technique with supervision only. [name] sensory, cognition and visual skills seem to be at pt's baseline. Noted L sided drift during amb within hallway, when asked pt to perform vertical head turns. Able to self correct, increased to CGA for remainder of ambulation to ensure pt safety. Pt would benefit from skilled OT services to address noted impairments and functional limitations (see below for any additional details) in order to maximize safety and independence while minimizing falls risk and caregiver burden. Do not anticipate the need for follow up OT services upon acute hospital DC. OT will follow acutely.     If plan is discharge home, recommend the following:   A little help with walking and/or transfers;A little help with bathing/dressing/bathroom;Assistance with cooking/housework     Functional Status Assessment   Patient has had a recent decline in their functional status and demonstrates the ability to make significant improvements in function in a reasonable and predictable amount of  time.     Equipment Recommendations   None recommended by OT     Recommendations for Other Services         Precautions/Restrictions   Precautions Precautions: Fall (Simultaneous filing. User may not have seen previous data.) Recall of Precautions/Restrictions: Intact (Simultaneous filing. User may not have seen previous data.) Restrictions Weight Bearing Restrictions Per Provider Order: No (Simultaneous filing. User may not have seen previous data.)     Mobility Bed Mobility Overal bed mobility: Modified Independent (Simultaneous filing. User may not have seen previous data.)             General bed mobility comments: Utilized bed rails supine<>sit    Transfers Overall transfer level: Needs assistance (Simultaneous filing. User may not have seen previous data.) Equipment used: None (Simultaneous filing. User may not have seen previous data.) Transfers: Sit to/from Stand Sit to Stand: Modified independent (Device/Increase time)           General transfer comment: No physical assistance required during t/f      Balance Overall balance assessment: Needs assistance (Simultaneous filing. User may not have seen previous data.) Sitting-balance support: No upper extremity supported, Feet supported Sitting balance-Leahy Scale: Normal Sitting balance - Comments: seated on EOB  during vision/ROM testing, not LOB noted       Standing balance comment: Noted L sided drift during amb within hallway, when asked pt to perform vertical head turns. Able to self correct - CGA for remainder of ambulation to ensure pt safety.  ADL either performed or assessed with clinical judgement   ADL Overall ADL's : Modified independent                                       General ADL Comments: Pt completed LB dressing with supervision while seated on EOB. Pt denied further ADLs on this date due to completing earlier in the day.      Vision Baseline Vision/History: 1 Wears glasses Patient Visual Report: No change from baseline Vision Assessment?: Yes Ocular Range of Motion: Within Functional Limits Alignment/Gaze Preference: Within Defined Limits Tracking/Visual Pursuits: Able to track stimulus in all quads without difficulty Saccades: Within functional limits Visual Fields: No apparent deficits Additional Comments: pt reports no change from baseline     Perception Perception: Within Functional Limits       Praxis Praxis: [facility]       Pertinent Vitals/Pain Pain Assessment Pain Assessment: No/denies pain (Simultaneous filing. User may not have seen previous data.)     Extremity/Trunk Assessment Upper Extremity Assessment Upper Extremity Assessment: Overall WFL for tasks assessed;Right hand dominant (Simultaneous filing. User may not have seen previous data.)   Lower Extremity Assessment Lower Extremity Assessment: Defer to PT evaluation;Generalized weakness (Simultaneous filing. User may not have seen previous data.)   Cervical / Trunk Assessment Cervical / Trunk Assessment: Normal (Simultaneous filing. User may not have seen previous data.)   Communication Communication Communication: No apparent difficulties (Simultaneous filing. User may not have seen previous data.)   Cognition Arousal: Alert (Simultaneous filing. User may not have seen previous data.) Behavior During Therapy: [facility] for tasks assessed/performed (Simultaneous filing. User may not have seen previous data.) Cognition: No apparent impairments             OT - Cognition Comments: Pt A/Ox4 throughout eval                 Following commands: Intact (Simultaneous filing. User may not have seen previous data.)       Cueing  General Comments   Cueing Techniques: Verbal cues  Pt very plesant, willining to participate   Exercises Exercises: Other exercises Other Exercises Other Exercises: edu: role of OT, safe ADL  completion   Shoulder Instructions      Home Living Family/patient expects to be discharged to:: Private residence Living Arrangements: Spouse/significant other Available Help at Discharge: Family Type of Home: House Home Access: Stairs to enter Secretary/administrator of Steps: 2 Entrance Stairs-Rails: None Home Layout: Two level     Bathroom Shower/Tub: Chief Strategy Officer: Standard     Home Equipment: None          Prior Functioning/Environment Prior Level of Function : Independent/Modified Independent;Driving (Simultaneous filing. User may not have seen previous data.)             Mobility Comments: Amb without DME PTA ADLs Comments: Indep in all ADL/IADL PTA, including driving    OT Problem List: Decreased activity tolerance;Impaired balance (sitting and/or standing);Decreased coordination;Decreased safety awareness;Decreased knowledge of precautions   OT Treatment/Interventions: Self-care/ADL training;DME and/or AE instruction;Therapeutic activities;Balance training      OT Goals(Current goals can be found in the care plan section)   Acute Rehab OT Goals Patient Stated Goal: to get better OT Goal Formulation: With patient Time For Goal Achievement: 09/25/23 Potential to Achieve Goals: Good ADL Goals Pt Will Perform Grooming: Independently;standing Pt Will Perform Lower  Body Dressing: Independently;sit to/from stand Pt Will Transfer to Toilet: Independently;ambulating;regular height toilet Pt Will Perform Toileting - Clothing Manipulation and hygiene: Independently   OT Frequency:  Min 1X/week    Co-evaluation PT/OT/SLP Co-Evaluation/Treatment: Yes Reason for Co-Treatment: For patient/therapist safety;To address functional/ADL transfers   OT goals addressed during session: ADL's and self-care      AM-PAC OT "6 Clicks" Daily Activity     Outcome Measure Help from another person eating meals?: None Help from another person taking  care of personal grooming?: A Little Help from another person toileting, which includes using toliet, bedpan, or urinal?: None Help from another person bathing (including washing, rinsing, drying)?: A Little Help from another person to put on and taking off regular upper body clothing?: None Help from another person to put on and taking off regular lower body clothing?: None 6 Click Score: 22   End of Session Equipment Utilized During Treatment: Gait belt  Activity Tolerance: Patient tolerated treatment well Patient left: in bed;with call bell/phone within reach;with bed alarm set;with SCD's reapplied  OT Visit Diagnosis: Unsteadiness on feet (R26.81);Other abnormalities of gait and mobility (R26.89);Muscle weakness (generalized) (M62.81)                Time: 1610-9604 OT Time Calculation (min): 33 min Charges:  OT General Charges $OT Visit: 1 Visit OT Evaluation $OT Eval Moderate Complexity: 1 Mod  Valbona Slabach M.S. OTR/L  09/11/23, 2:49 PM

## 2023-09-11 NOTE — Progress Notes (Signed)
 NEUROLOGY CONSULT FOLLOW UP NOTE   Date of service: September 11, 2023 Patient Name: Samuel Choi MRN:  [id number] DOB:  10/07/1965  Interval Hx/subjective  Status post TNK from yesterday Seen and examined in the ICU Reports near resolution of left-sided symptoms   Vitals   Vitals:   09/11/23 0300 09/11/23 0400 09/11/23 0500 09/11/23 0600  BP: 113/84 122/83 114/88 117/87  Pulse: 94 94 92 97  Resp: (!) 24 (!) 22 20 (!) 22  Temp:  98.7 F (37.1 C)    TempSrc:  Oral    SpO2: 92% 97% 96% 93%  Weight:  54.3 kg    Height:         Body mass index is 15.79 kg/m.  Physical Exam   General: Awake alert in no distress HEENT: Normocephalic atraumatic Lungs: Clear Cardiovascular: Regular rate rhythm Neurological exam Awake alert oriented x 3.  No dysarthria.  No aphasia. Cranial nerves II to XII: With the exception of disc gaze at baseline, no other deficits. Motor examination with no drift Sensation today is intact to light touch all over Coordination exam reveals no dysmetria   Medications  Current Facility-Administered Medications:     stroke: early stages of recovery book, , Does not apply, Once, Milon Dikes, MD   0.9 %  sodium chloride infusion, , Intravenous, Continuous, Milon Dikes, MD, Last Rate: 40 mL/hr at 09/11/23 0600, Infusion Verify at 09/11/23 0600   acetaminophen (TYLENOL) tablet 650 mg, 650 mg, Oral, Q4H PRN **OR** acetaminophen (TYLENOL) 160 MG/5ML solution 650 mg, 650 mg, Per Tube, Q4H PRN **OR** acetaminophen (TYLENOL) suppository 650 mg, 650 mg, Rectal, Q4H PRN, Milon Dikes, MD   cefTRIAXone (ROCEPHIN) 1 g in sodium chloride 0.9 % 100 mL IVPB, 1 g, Intravenous, Q24H, Harlon Ditty D, NP   Chlorhexidine Gluconate Cloth 2 % PADS 6 each, 6 each, Topical, Daily, Dgayli, Khabib, MD   labetalol (NORMODYNE) injection 20 mg, 20 mg, Intravenous, Once **AND** clevidipine (CLEVIPREX) infusion 0.5 mg/mL, 0-21 mg/hr, Intravenous, Continuous, Milon Dikes, MD, Held at  09/10/23 1227   LORazepam (ATIVAN) injection 2 mg, 2 mg, Intravenous, Once, [place], Hubbard Hartshorn, NP   magnesium sulfate IVPB 2 g 50 mL, 2 g, Intravenous, Once, Mila Merry A, RPH, Last Rate: 50 mL/hr at 09/11/23 0903, 2 g at 09/11/23 0903   pantoprazole (PROTONIX) injection 40 mg, 40 mg, Intravenous, QHS, Milon Dikes, MD, 40 mg at 09/10/23 2100   senna-docusate (Senokot-S) tablet 1 tablet, 1 tablet, Oral, QHS PRN, Milon Dikes, MD   sodium chloride flush (NS) 0.9 % injection 10-40 mL, 10-40 mL, Intracatheter, Q12H, Dgayli, Khabib, MD, 10 mL at 09/10/23 2100   sodium chloride flush (NS) 0.9 % injection 10-40 mL, 10-40 mL, Intracatheter, PRN, Raechel Chute, MD  Facility-Administered Medications Ordered in Other Encounters:    dexamethasone (DECADRON) 10 MG/ML injection, , , ,    heparin lock flush 100 unit/mL, 500 Units, Intravenous, Once, Rickard Patience, MD   heparin lock flush 100 unit/mL, 500 Units, Intravenous, Once, Rickard Patience, MD   sodium chloride flush (NS) 0.9 % injection 10 mL, 10 mL, Intravenous, PRN, Rickard Patience, MD, 10 mL at 04/04/19 0903   sodium chloride flush (NS) 0.9 % injection 10 mL, 10 mL, Intravenous, PRN, Rickard Patience, MD, 10 mL at 07/26/20 1308   sodium chloride flush (NS) 0.9 % injection 10 mL, 10 mL, Intravenous, PRN, Rickard Patience, MD, 10 mL at 07/28/21 0828   sodium chloride flush (NS) 0.9 % injection 10 mL, 10  mL, Intravenous, PRN, Rickard Patience, MD, 10 mL at 12/21/22 1308  Labs and Diagnostic Imaging   CBC:  Recent Labs  Lab 09/10/23 1206 09/11/23 0406  WBC 15.6* 13.1*  NEUTROABS 12.7*  --   HGB 12.1* 11.5*  HCT 35.4* 33.0*  MCV 98.3 98.2  PLT 198 190    Basic Metabolic Panel:  Lab Results  Component Value Date   NA 130 (L) 09/11/2023   K 4.1 09/11/2023   CO2 23 09/11/2023   GLUCOSE 111 (H) 09/11/2023   BUN 12 09/11/2023   CREATININE 1.02 09/11/2023   CALCIUM 8.6 (L) 09/11/2023   GFRNONAA >60 09/11/2023   GFRAA >60 04/07/2020   Lipid Panel:  Lab Results   Component Value Date   LDLCALC 47 09/11/2023   HgbA1c:  Lab Results  Component Value Date   HGBA1C 4.8 09/10/2023   Urine Drug Screen:     Component Value Date/Time   LABOPIA NONE DETECTED 09/10/2023 2304   COCAINSCRNUR NONE DETECTED 09/10/2023 2304   LABBENZ NONE DETECTED 09/10/2023 2304   AMPHETMU NONE DETECTED 09/10/2023 2304   THCU NONE DETECTED 09/10/2023 2304   LABBARB NONE DETECTED 09/10/2023 2304    Alcohol Level     Component Value Date/Time   ETH <10 09/10/2023 1206   INR  Lab Results  Component Value Date   INR 1.1 09/10/2023   APTT  Lab Results  Component Value Date   APTT 32 09/10/2023   CT Head without contrast(Personally reviewed): No acute intracranial process  CT angio Head and Neck with contrast(Personally reviewed): No ELVO  MRI Brain(Personally reviewed): Pending at the time of this dictation  2D echo: Pending at the time of dictation  No evidence of A-fib on telemetry   Assessment   Samuel Choi is a 58 y.o. 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  In triage noted to have left-sided weakness involving the arm and leg for which code stroke was activated.  CT head personally reviewed by me-no acute changes.  Wrist benefits and alternatives of IV TNKase discussed and he agreed. IV TNKase was given Symptoms have since resolved  Impression: Acute ischemic stroke-etiology under investigation-hypercoagulability from cancer versus paradoxical embolization remain as viable etiologies  Recommendations  Continue post TNK care Therapy assessments when 24 hours are up after TNK 24-hour MRI pending-will review and provide further recommendations on workup as well as resumption of antiplatelet or anticoagulation.  Given his history of DVT and PE, if there is no bleed after TNK, he  should technically be on anticoagulation but that timing will depend on the stroke size-further details to follow after MRI Follow-up on 2D echo A1c and LDL are at goal-do not see that he is on any statin at home.  Okay to hold off statin for now.  Preliminary plan discussed with the PCCM team.  Will update as more test results become available during the course of the day ______________________________________________________________________   Signed, Milon Dikes, MD [facility]   ADDENDUM MRI brain completed and reviewed.  Small cortically based infarct involving the right frontal lobe. Based on the location-hypercoagulability versus cardioembolic versus paradoxical embolization remain in the differentials. 2D echocardiogram shows LVEF of greater than 75% with grade 1 diastolic dysfunction, right ventricular systolic function is moderately reduced and the right ventricle is severely enlarged.  Right atrial size mildly dilated, left atrium normal in size.  Given the small size of the stroke, I would start him on anticoagulation tomorrow-would be day 3 from the last known normal.  For now we will start him on an aspirin 81. In the past he was unable to afford Eliquis, was given Xarelto but he did not take that. Will request pharmacy assistance to see what and how to best serve him with this. Cancer related hypercoagulability-I would recommend Eliquis or Lovenox.  For now aspirin - will discuss with patient and his onc team as well as pharmacy to have him started on an anticoagulant that he can afford and be compliant with.     CRITICAL CARE ATTESTATION Performed by: Milon Dikes, MD Total critical care time: 45 minutes Critical care time was exclusive of separately billable procedures and treating other patients and/or supervising APPs/Residents/Students Critical care was necessary to treat or prevent imminent or life-threatening deterioration. This patient is critically  ill and at significant risk for neurological worsening and/or death and care requires constant monitoring. Critical care was time spent personally by me on the following activities: development of treatment plan with patient and/or surrogate as well as nursing, discussions with consultants, evaluation of patient's response to treatment, examination of patient, obtaining history from patient or surrogate, ordering and performing treatments and interventions, ordering and review of laboratory studies, ordering and review of radiographic studies, pulse oximetry, re-evaluation of patient's condition, participation in multidisciplinary rounds and medical decision making of high complexity in the care of this patient.

## 2023-09-11 NOTE — Telephone Encounter (Signed)
 Pharmacy Patient Advocate Encounter  Insurance verification completed.    The patient is insured through [facility].   Ran test claim for Eliquis 5 mg and the current 30 day co-pay is $47.00 Ran test claim for Xarelto 20 and it is refill too soon 09/26/23.   This test claim was processed through [facility]- copay amounts may vary at other pharmacies due to pharmacy/plan contracts, or as the patient moves through the different stages of their insurance plan.

## 2023-09-11 NOTE — Progress Notes (Signed)
*  PRELIMINARY RESULTS* Echocardiogram 2D Echocardiogram has been performed.  Samuel Choi 09/11/2023, 10:39 AM

## 2023-09-11 NOTE — Progress Notes (Signed)
 NAME:  Samuel Choi, MRN:  [id number], DOB:  18-Nov-1965, LOS: 1 ADMISSION DATE:  09/10/2023, CONSULTATION DATE:  09/10/23 REFERRING MD:  Dr. Wilford Corner, CHIEF COMPLAINT:  Acute onset Left sided weakness   Brief Pt Description / Synopsis:  58 y.o. male with PMHx of squamous cell lung cancer, COPD, DVT and PE (supposed to be on Xarelto but reports not taking it due to affordability issues) who presented from [facility] with syncope and left sided weakness/sensory loss.  Concern for Acute Ischemic CVA, status post TNK.  Also found to have UTI.  History of Present Illness:  Samuel Choi is a 58 year old male with a past medical history significant for squamous cell lung cancer, DVT and PE supposed to be on Xarelto (but reports not taking it due to affordability issues), and COPD who presented to [facility] ED on 10/07/2023 from the cancer center due to syncopal episode.  While in ER triage, he was noted to have left-sided weakness of both the arm and leg, of which code stroke was initiated.  Upon Neurology's evaluation he continued to have left arm and left leg weakness, with arm drift and left-sided sensory loss.  Stat CT Head showed no acute bleed.  Decision was made to proceed with TNK administration.  ED Course: Initial Vital Signs: Temperature 98.1 F orally, pulse 86, respiratory rate 20, blood pressure 105/80, SpO2 100% on room air Significant Labs: Sodium 130, bicarb 21, glucose 123, WBC 15.6, hemoglobin 12.1, hematocrit 35.4, ethyl alcohol less than 10 Urinalysis is currently pending Imaging Chest X-ray>> pending Medications Administered: TNK @ 11:41  PCCM asked to admit for further workup and treatment.  Please see "Significant Hospital Events" section below for full detailed hospital course.   Pertinent  Medical History   Past Medical History:  Diagnosis Date   Alcohol abuse    usually drinks 2-3 drinks per day   Atherosclerosis 06/2018   Cancer associated pain    Chronic sinusitis     Dehydration 02/07/2019   Emphysema of lung (HCC) 06/2018   patient unaware of this.   Hip fracture (HCC) 05/2018   no surgery   History of kidney stones 05/2018   per xray, bilateral nephrolitiasis   Hypertension    Squamous cell carcinoma of lung, right (HCC) 06/2018    Micro Data:  3/4: Urine>>  Antimicrobials:   Anti-infectives (From admission, onward)    Start     Dose/Rate Route Frequency Ordered Stop   09/11/23 1000  cefTRIAXone (ROCEPHIN) 1 g in sodium chloride 0.9 % 100 mL IVPB        1 g 200 mL/hr over 30 Minutes Intravenous Every 24 hours 09/11/23 0915         Significant Hospital Events: Including procedures, antibiotic start and stop dates in addition to other pertinent events   3/3: Presented to ED from Cancer center due to syncope and left sided weakness.  Code Stroke initiated, Neurology consulted, received TNK at 11:41 3/4: No acute events overnight, near resolution of left sided weakness.  MRI Brain pending, if no hemorrhagic conversion, then can transition out to Stroke unit.  Echo and full workup pending.  UA overnight consistent with UTI, start Ceftriaxone.  Interim History / Subjective:  As outlined above under significant hospital events.  Objective   Blood pressure 117/87, pulse 97, temperature 98.7 F (37.1 C), temperature source Oral, resp. rate (!) 22, height 6\' 1"  (1.854 m), weight 54.3 kg, SpO2 93%.        Intake/Output  Summary (Last 24 hours) at 09/11/2023 0916 Last data filed at 09/11/2023 6045 Gross per 24 hour  Intake 1366.67 ml  Output 1000 ml  Net 366.67 ml   Filed Weights   09/10/23 1300 09/11/23 0400  Weight: 54.4 kg 54.3 kg    Examination: General: Acute on chronically ill-appearing frail elderly male, sitting in bed, on room air, no acute distress HENT: Atraumatic, normocephalic, neck supple, no JVD Lungs: Clear breath sounds throughout, even, nonlabored, normal effort Cardiovascular: Regular rate and rhythm, S1-S2, no murmurs,  rubs, gallops Abdomen: Soft, nontender, nondistended, no guarding or tenderness, bowel sounds positive x 4 Extremities: Normal bulk and tone, no deformities, no edema Neuro: Awake and alert, oriented x 4, moves all extremities to commands, no focal deficits (left-sided weakness has resolved), pupils PERRLA GU: Deferred  Resolved Hospital Problem list     Assessment & Plan:   #Acute Left sided weakness concerning for Acute Ischemic CVA, s/p TNK on 3/3 @ 11:41 am -CT Head on presentation negative for acute intracranial abnormality -CTa Head & Neck: 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 (age indeterminate), otherwise no other LVO -ICU monitoring -Frequent neurochecks and NIHSS scores per post-TNK protocol -Neurology following, appreciate input -Obtain stat head CT for any change in neurological exam to rule out hemorrhagic conversion -MRI brain at 24 hrs post TNK pending today at 10:00 -No antiplatelets or anticoagulation for 24 hours post TNKase, and until ICH ruled out on repeat imaging at 24 hours -Maintain SBP <180 and/or DBP <105 -As needed labetalol and nicardipine drip if needed to maintain blood pressure goal -SCDs for DVT prophylaxis -N.p.o., bedside swallow study pending -PT/OT/speech therapy consult -Fasting lipid panel: Total cholesterol 104, HDL 34, LDL 47, Triglycerides 116 ~ per Neuro ok to hold off on Statin for now as he is not on it outpatient and LDL is at goal -Hemoglobin A1c is 4.8 -Echocardiogram is pending  #History of PE and DVT -Continuous cardiac monitoring -Maintain MAP >65 -IV fluids -Vasopressors as needed to maintain MAP goal -Will need to resume outpatient Xarelto, but will defer to Neurology when safe to resume  #Leukocytosis due to ... #UTI -Monitor fever curve -Trend WBC's & Procalcitonin -Follow cultures as above -Continue empiric Ceftriaxone pending cultures & sensitivities  #Mild Hyponatremia -Monitor I&O's / urinary output -Follow  BMP -Ensure adequate renal perfusion -Avoid nephrotoxic agents as able -Replace electrolytes as indicated ~ Pharmacy following for assistance with electrolyte replacement -IV fluids      Best Practice (right click and "Reselect all SmartList Selections" daily)   Diet/type: Regular consistency (see orders) DVT prophylaxis: SCD GI prophylaxis: PPI Lines: Chest port accessed and is still needed Foley:  N/A Code Status:  full code Last date of multidisciplinary goals of care discussion [3/4]  3/4: Pt updated at bedside on plan of care.  Labs   CBC: Recent Labs  Lab 09/10/23 1206 09/11/23 0406  WBC 15.6* 13.1*  NEUTROABS 12.7*  --   HGB 12.1* 11.5*  HCT 35.4* 33.0*  MCV 98.3 98.2  PLT 198 190    Basic Metabolic Panel: Recent Labs  Lab 09/10/23 0941 09/10/23 1206 09/11/23 0406  NA  --  130* 130*  K  --  4.5 4.1  CL  --  101 100  CO2  --  21* 23  GLUCOSE  --  123* 111*  BUN  --  12 12  CREATININE  --  0.90 1.02  CALCIUM  --  8.6* 8.6*  MG 1.5*  --  1.4*  PHOS  --   --  2.8   GFR: Estimated Creatinine Clearance: 60.6 mL/min (by C-G formula based on SCr of 1.02 mg/dL). Recent Labs  Lab 09/10/23 1206 09/11/23 0406  PROCALCITON 0.22  --   WBC 15.6* 13.1*    Liver Function Tests: Recent Labs  Lab 09/10/23 1206 09/11/23 0406  AST 15  --   ALT 11  --   ALKPHOS 102  --   BILITOT 0.9  --   PROT 6.7  --   ALBUMIN 2.6* 2.6*   No results for input(s): "LIPASE", "AMYLASE" in the last 168 hours. No results for input(s): "AMMONIA" in the last 168 hours.  ABG    Component Value Date/Time   PHART 7.47 (H) 08/01/2018 0715   PCO2ART 32 08/01/2018 0715   PO2ART 88 08/01/2018 0715   HCO3 38.5 (H) 07/05/2023 2133   O2SAT 77.6 07/05/2023 2133     Coagulation Profile: Recent Labs  Lab 09/10/23 1206  INR 1.1    Cardiac Enzymes: No results for input(s): "CKTOTAL", "CKMB", "CKMBINDEX", "TROPONINI" in the last 168 hours.  HbA1C: Hemoglobin A1C   Date/Time Value Ref Range Status  07/04/2014 03:04 PM 4.4 4.2 - 6.3 % Final    Comment:    The American Diabetes Association recommends that a primary goal of therapy should be <7% and that physicians should reevaluate the treatment regimen in patients with HbA1c values consistently >8%.    Hgb A1c MFr Bld  Date/Time Value Ref Range Status  09/10/2023 12:06 PM 4.8 4.8 - 5.6 % Final    Comment:    (NOTE) Pre diabetes:          5.7%-6.4%  Diabetes:              >6.4%  Glycemic control for   <7.0% adults with diabetes   01/25/2022 10:54 AM 4.8 4.8 - 5.6 % Final    Comment:    (NOTE) Pre diabetes:          5.7%-6.4%  Diabetes:              >6.4%  Glycemic control for   <7.0% adults with diabetes     CBG: Recent Labs  Lab 09/10/23 1119 09/10/23 1300 09/11/23 0757  GLUCAP 84 88 105*    Review of Systems:   Positives in BOLD: Gen: Denies fever, chills, weight change, fatigue, night sweats HEENT: Denies blurred vision, double vision, hearing loss, tinnitus, sinus congestion, rhinorrhea, sore throat, neck stiffness, dysphagia PULM: Denies shortness of breath, cough, sputum production, hemoptysis, wheezing CV: Denies chest pain, edema, orthopnea, paroxysmal nocturnal dyspnea, palpitations GI: Denies abdominal pain, nausea, vomiting, diarrhea, hematochezia, melena, constipation, change in bowel habits GU: Denies dysuria, hematuria, polyuria, oliguria, urethral discharge Endocrine: Denies hot or cold intolerance, polyuria, polyphagia or appetite change Derm: Denies rash, dry skin, scaling or peeling skin change Heme: Denies easy bruising, bleeding, bleeding gums Neuro: Denies headache, numbness, weakness, slurred speech, loss of memory or consciousness   Past Medical History:  He,  has a past medical history of Alcohol abuse, Atherosclerosis (06/2018), Cancer associated pain, Chronic sinusitis, Dehydration (02/07/2019), Emphysema of lung (HCC) (06/2018), Hip fracture  (HCC) (05/2018), History of kidney stones (05/2018), Hypertension, and Squamous cell carcinoma of lung, right (HCC) (06/2018).   Surgical History:   Past Surgical History:  Procedure Laterality Date   BRAIN SURGERY  10/2017   nasal/sinus endoscopy. mass benign   ELECTROMAGNETIC NAVIGATION BROCHOSCOPY Right 06/25/2018   Procedure: ELECTROMAGNETIC NAVIGATION BRONCHOSCOPY;  Surgeon: Erin Fulling, MD;  Location: [facility] ORS;  Service: Cardiopulmonary;  Laterality: Right;   ENDOBRONCHIAL ULTRASOUND Right 06/25/2018   Procedure: ENDOBRONCHIAL ULTRASOUND;  Surgeon: Erin Fulling, MD;  Location: [facility];  Service: Cardiopulmonary;  Laterality: Right;   ENDOBRONCHIAL ULTRASOUND Right 12/26/2018   Procedure: ENDOBRONCHIAL ULTRASOUND RIGHT;  Surgeon: Erin Fulling, MD;  Location: [facility];  Service: Cardiopulmonary;  Laterality: Right;   FLEXIBLE BRONCHOSCOPY N/A 08/20/2018   Procedure: FLEXIBLE BRONCHOSCOPY PREOP;  Surgeon: Hulda Marin, MD;  Location: [facility] OR[facility];  Service: Thoracic;  Laterality: N/A;   IR CV LINE INJECTION  04/04/2019   NASAL SINUS SURGERY  10/2017   At [facility], frontal sinusotomy, ethmoidectomy, resection anterior cranial fossa neoplasm, turbinate resection   PERIPHERAL VASCULAR THROMBECTOMY Left 12/31/2020   Procedure: PERIPHERAL VASCULAR THROMBECTOMY;  Surgeon: Annice Needy, MD;  Location: [facility];  Service: Cardiovascular;  Laterality: Left;   PORTACATH PLACEMENT Left 01/15/2019   Procedure: INSERTION PORT-A-CATH;  Surgeon: Hulda Marin, MD;  Location: [facility];  Service: General;  Laterality: Left;   THORACOTOMY Right 08/13/2018   Procedure: PREOP BROCHOSCOPY WITH RIGHT THORACOTOMY AND RUL RESECTION;  Surgeon: Hulda Marin, MD;  Location: [facility];  Service: General;  Laterality: Right;   THORACOTOMY Right 08/20/2018   Procedure: THORACOTOMY MAJOR RIGHT UPPER LOBE LOBECTOMY;  Surgeon: Hulda Marin, MD;  Location: [facility];  Service: Thoracic;  Laterality: Right;   TOE SURGERY  Left    pin in left toe     Social History:   reports that he quit smoking about 5 years ago. His smoking use included cigarettes. He started smoking about 20 years ago. He has a 7.5 pack-year smoking history. He has never used smokeless tobacco. He reports current alcohol use of about 3.0 standard drinks of alcohol per week. He reports that he does not use drugs.   Family History:  His family history includes Breast cancer in his mother; Diabetes in his mother; Hypertension in his father; Lung cancer in his father.   Allergies No Known Allergies   Home Medications  Prior to Admission medications   Medication Sig Start Date End Date Taking? Authorizing Provider  albuterol (PROVENTIL) (2.5 MG/3ML) 0.083% nebulizer solution Take 3 mLs (2.5 mg total) by nebulization every 6 (six) hours as needed for wheezing or shortness of breath. 06/15/23   Leeroy Bock, MD  albuterol (VENTOLIN HFA) 108 (90 Base) MCG/ACT inhaler INHALE 2 PUFFS BY MOUTH EVERY 6 HOURS AS NEEDED FOR WHEEZING OR SHORTNESS OF BREATH 06/15/23   Leeroy Bock, MD  benzonatate (TESSALON) 200 MG capsule Take 1 capsule (200 mg total) by mouth 3 (three) times daily as needed for cough. 07/09/23   Tresa Moore, MD  Budeson-Glycopyrrol-Formoterol (BREZTRI AEROSPHERE) 160-9-4.8 MCG/ACT AERO Inhale 2 puffs into the lungs in the morning and at bedtime. 06/15/23   Leeroy Bock, MD  cloNIDine (CATAPRES) 0.1 MG tablet Take 1 tablet (0.1 mg total) by mouth 3 (three) times daily. 06/15/23 06/14/24  Leeroy Bock, MD  cyanocobalamin (VITAMIN B12) 1000 MCG tablet Take 1 tablet (1,000 mcg total) by mouth daily. 06/27/22   Borders, Daryl Eastern, NP  diltiazem (CARDIZEM CD) 120 MG 24 hr capsule Take 120 mg by mouth daily. 06/19/19   [provider]  feeding supplement (ENSURE ENLIVE / ENSURE PLUS) LIQD Take 237 mLs by mouth 3 (three) times daily between meals. 02/05/22   Sunnie Nielsen, DO  furosemide (LASIX) 40 MG  tablet Take 1 tablet (40 mg total)  by mouth daily as needed for fluid or edema. 07/01/23 06/30/24  Amin, Ankit C, MD  latanoprost (XALATAN) 0.005 % ophthalmic solution Place 1 drop into both eyes at bedtime. 05/04/21   [provider]  loratadine (CLARITIN) 10 MG tablet TAKE 1 TABLET(10 MG) BY MOUTH DAILY AS NEEDED FOR ALLERGIES 08/13/23   Rickard Patience, MD  magnesium chloride (SLOW-MAG) 64 MG TBEC SR tablet Take 1 tablet (64 mg total) by mouth 2 (two) times daily. 09/03/23   Rickard Patience, MD  metoprolol succinate (TOPROL-XL) 25 MG 24 hr tablet Take 1 tablet (25 mg total) by mouth daily. 11/28/22   Rickard Patience, MD  Multiple Vitamin (MULTIVITAMIN WITH MINERALS) TABS tablet Take 1 tablet by mouth daily. 02/05/22   Sunnie Nielsen, DO  pantoprazole (PROTONIX) 20 MG tablet TAKE 1 TABLET BY MOUTH DAILY 02/23/23   Rickard Patience, MD  potassium chloride SA (KLOR-CON M) 20 MEQ tablet Take 1 tablet (20 mEq total) by mouth daily. 10/26/22   Rickard Patience, MD  rivaroxaban (XARELTO) 10 MG TABS tablet Take 1 tablet (10 mg total) by mouth daily. 09/03/23   Rickard Patience, MD     Critical care time: 18 minutes     Harlon Ditty, AGACNP-BC [facility] Prefer epic messenger for cross cover needs If after hours, please call E-link

## 2023-09-11 NOTE — TOC Initial Note (Signed)
 Transition of Care [facility]) - Initial/Assessment Note    Patient Details  Name: Samuel Choi MRN: [id number] Date of Birth: [birth year]  Transition of Care [facility]) CM/SW Contact:    Garret Reddish, RN Phone Number: 09/11/2023, 4:23 PM  Clinical Narrative:                 Chart reviewed.  Noted that patient was admitted with Acute Ischemia stroke.  Noted that patient received TNK on 09-10-23.  PT/OT/Speech evaluations are in progress.    I have meet with patient at bedside.  I have introduced myself and my role as TOC Case Manager.   He informs me that prior to admission he lived at home with his wife.  He informs me that he was independent of ADLs.  He informs me that he was driving prior to admission.  He reports that he has no DME at home.    Patient informs me that he does a PCP.  Mrs. Finigan informs me that he was not able to afford his Eliquis prior to admission.  He informed me that he had to meet a ductible.  He was informed cost of Eliquis was 500 dollars until he meet his ductible.  He informs me that the medications was changed to something else and the cost of the medication was 400 dollars and he was not able to afford that.  I have informed him that Pharmacy Tech ran a Test claim for Eliquis and co-pay will be 47 dollars.  I have made patient aware of this information and he informed me that he was able to afford this co-pay.   Noted PT/OT/Speech evaluations are in progress.  Will follow up with PT/OT/Speech recommendations for discharge planning.    TOC will continue to follow for discharge planning.    Expected Discharge Plan:  (TBD) Barriers to Discharge: No Barriers Identified   Patient Goals and CMS Choice            Expected Discharge Plan and Services   Discharge Planning Services: CM Consult   Living arrangements for the past 2 months: Single Family Home                                      Prior Living Arrangements/Services Living arrangements for  the past 2 months: Single Family Home Lives with:: Spouse Patient language and need for interpreter reviewed:: Yes Do you feel safe going back to the place where you live?: Yes        Care giver support system in place?: Yes (comment) (Patient has a supportive wife)      Activities of Daily Living   ADL Screening (condition at time of admission) Independently performs ADLs?: Yes (appropriate for developmental age) Is the patient deaf or have difficulty hearing?: No Does the patient have difficulty seeing, even when wearing glasses/contacts?: No Does the patient have difficulty concentrating, remembering, or making decisions?: No  Permission Sought/Granted                  Emotional Assessment Appearance:: Appears stated age Attitude/Demeanor/Rapport: Engaged Affect (typically observed): Appropriate Orientation: : Oriented to Self, Oriented to Place, Oriented to  Time, Oriented to Situation      Admission diagnosis:  Acute ischemic stroke [facility]) [I63.9] Patient Active Problem List   Diagnosis Date Noted   Acute ischemic stroke (HCC) 09/10/2023   At high risk for bleeding  after thrombolytic therapy 09/10/2023   RSV (respiratory syncytial virus pneumonia) 07/06/2023   Protein-calorie malnutrition, severe 07/06/2023   Pneumonia due to respiratory syncytial virus (RSV) 07/05/2023   Chronic anticoagulation 07/05/2023   Severe pulmonary hypertension (HCC) 07/05/2023   Severe tricuspid regurgitation by prior echocardiogram 07/05/2023   Alcohol use disorder 07/05/2023   CHF (congestive heart failure) (HCC) 06/29/2023   Hypoxia 06/14/2023   History of lung cancer 06/14/2023   COPD with acute exacerbation (HCC) 06/14/2023   Acute on chronic respiratory failure with hypoxia (HCC) 06/14/2023   Necrotic pneumonia (HCC) 04/07/2022   Cavitary lesion of lung 02/11/2022   Acute respiratory failure (HCC) 02/10/2022   Lactic acidosis 02/10/2022   SVT (supraventricular tachycardia)  (HCC) 02/02/2022   Sepsis (HCC) 02/01/2022   Hyponatremia 02/01/2022   Acute on chronic diastolic CHF (congestive heart failure) (HCC) 02/01/2022   Macrocytic anemia 02/01/2022   Elevated serum creatinine 01/25/2022   Diverticulosis of colon 11/18/2021   History of DVT and PE 11/18/2021   Weight loss 11/09/2021   C. difficile colitis 10/19/2021   Hyperkalemia 07/28/2021   Protein calorie malnutrition (HCC) 06/08/2021   Antineoplastic chemotherapy induced anemia 06/08/2021   Pulmonary embolus (HCC) 06/08/2021   Chemotherapy induced nausea and vomiting 05/17/2021   Symptomatic anemia 05/17/2021   Wide-complex tachycardia    Chemotherapy induced neutropenia (HCC) 11/09/2020   COPD (chronic obstructive pulmonary disease) (HCC) 04/22/2020   Sinus tachycardia 12/18/2019   Hypomagnesemia 12/10/2019   Encounter for antineoplastic immunotherapy 11/24/2019   Ex-smoker 07/23/2019   Port-A-Cath in place 04/04/2019   Thrombocytopenia (HCC) 04/04/2019   Folate deficiency 04/04/2019   Hypokalemia 04/04/2019   B12 deficiency 04/04/2019   Alcohol abuse 04/04/2019   Dehydration 02/07/2019   Encounter for antineoplastic chemotherapy 01/31/2019   Squamous cell carcinoma of right lung (HCC) 09/03/2018   Goals of care, counseling/discussion 09/03/2018   Malnutrition of moderate degree 08/23/2018   Emphysema of lung (HCC) 06/2018   Allergic rhinitis 06/2018   H/O nasal polyp 02/20/2018   Acute sinusitis 10/10/2017   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/10/2017   Chronic rhinitis 10/10/2017   Mass of sinus 10/10/2017   Essential hypertension 09/27/2017   Tobacco abuse 09/27/2017   PCP:  Barbette Reichmann, MD Pharmacy:   [id number] [place], [place] - [address] AT [facility] [address] [place] [address] Phone: [phone] Fax: [phone]  [facility] - [place] - [address] [place] [address] Phone: [phone] Fax: [phone]     Social Drivers of Health (SDOH) Social History: SDOH Screenings   Food Insecurity: No Food Insecurity (09/10/2023)  Housing: Low Risk  (09/10/2023)  Transportation Needs: No Transportation Needs (09/10/2023)  Utilities: Not At Risk (09/10/2023)  Depression (PHQ2-9): Low Risk  (03/28/2022)  Financial Resource Strain: Low Risk  (08/29/2018)   Received from [facility], [facility]  Physical Activity: Inactive (08/29/2018)   Received from [facility], [facility]  Social Connections: Moderately Integrated (09/10/2023)  Tobacco Use: Medium Risk (09/10/2023)   SDOH Interventions:     Readmission Risk Interventions    02/12/2022   11:30 AM  Readmission Risk Prevention Plan  Transportation Screening Complete  PCP or Specialist Appt within 3-5 Days Complete  HRI or Home Care Consult Complete  Social Work Consult for Recovery Care Planning/Counseling Complete  Palliative Care Screening Not Applicable  Medication Review Oceanographer) Complete

## 2023-09-11 NOTE — Evaluation (Signed)
 Physical Therapy Evaluation & Discharge Patient Details Name: Samuel Choi MRN: [id number] DOB: 10/02/1965 Today's Date: 09/11/2023  History of Present Illness  58 y/o male presented to ED on 09/10/23 from [facility] where he experienced syncopal episode. Noted L sided weakness, L gaze preference, and decreased sensation on L side in ED. TNK given at 1141 on 3/3. MRI found small cortically based infarct involving R frontal lobe. PMH: COPD, hx of squamous cell lung cancer, hx of DVT/PE, HTN  Clinical Impression  Patient admitted with the above. PTA, patient lives with wife and reports independence with no AD. Patient functioning at modI level for mobility with no AD. He did have minor LOB during ambulation with vertical head turns requiring CGA for steadying. Overall, patient functioning at baseline level. No further skilled PT needs identified acutely. No PT follow up recommended at this time.         If plan is discharge home, recommend the following:     Can travel by private vehicle        Equipment Recommendations None recommended by PT  Recommendations for Other Services       Functional Status Assessment Patient has not had a recent decline in their functional status     Precautions / Restrictions Precautions Precautions: Fall Recall of Precautions/Restrictions: Intact Restrictions Weight Bearing Restrictions Per Provider Order: No      Mobility  Bed Mobility Overal bed mobility: Modified Independent                  Transfers Overall transfer level: Modified independent Equipment used: None                    Ambulation/Gait Ambulation/Gait assistance: Supervision Gait Distance (Feet): 200 Feet Assistive device: None Gait Pattern/deviations: Step-through pattern, Decreased stride length Gait velocity: decreased     General Gait Details: supervision for safety. Minor LOB with vertical head turns requiring CGA to steady but otherwise mobilizing  at baseline  Stairs            Wheelchair Mobility     Tilt Bed    Modified Rankin (Stroke Patients Only)       Balance Overall balance assessment: Modified Independent                                           Pertinent Vitals/Pain Pain Assessment Pain Assessment: No/denies pain    Home Living Family/patient expects to be discharged to:: Private residence Living Arrangements: Spouse/significant other Available Help at Discharge: Family Type of Home: House Home Access: Stairs to enter   Secretary/administrator of Steps: 2   Home Layout: Two level Home Equipment: None      Prior Function Prior Level of Function : Independent/Modified Independent;Driving                     Extremity/Trunk Assessment   Upper Extremity Assessment Upper Extremity Assessment: Defer to OT evaluation    Lower Extremity Assessment Lower Extremity Assessment: Generalized weakness (B hip flexion 3/5)    Cervical / Trunk Assessment Cervical / Trunk Assessment: Normal  Communication   Communication Communication: No apparent difficulties    Cognition Arousal: Alert Behavior During Therapy: WFL for tasks assessed/performed   PT - Cognitive impairments: No apparent impairments  Following commands: Intact       Cueing       General Comments      Exercises     Assessment/Plan    PT Assessment Patient does not need any further PT services  PT Problem List         PT Treatment Interventions      PT Goals (Current goals can be found in the Care Plan section)  Acute Rehab PT Goals Patient Stated Goal: to go home PT Goal Formulation: With patient    Frequency       Co-evaluation               AM-PAC PT "6 Clicks" Mobility  Outcome Measure Help needed turning from your back to your side while in a flat bed without using bedrails?: None Help needed moving from lying on your back to sitting on  the side of a flat bed without using bedrails?: None Help needed moving to and from a bed to a chair (including a wheelchair)?: None Help needed standing up from a chair using your arms (e.g., wheelchair or bedside chair)?: None Help needed to walk in hospital room?: A Little Help needed climbing 3-5 steps with a railing? : A Little 6 Click Score: 22    End of Session Equipment Utilized During Treatment: Gait belt Activity Tolerance: Patient tolerated treatment well Patient left: in bed;with call bell/phone within reach Nurse Communication: Mobility status PT Visit Diagnosis: Unsteadiness on feet (R26.81);Muscle weakness (generalized) (M62.81)    Time: 4098-1191 PT Time Calculation (min) (ACUTE ONLY): 16 min   Charges:   PT Evaluation $PT Eval Moderate Complexity: 1 Mod   PT General Charges $$ ACUTE PT VISIT: 1 Visit         Maylon Peppers, PT, DPT Physical Therapist - [facility]  [facility]   Janashia Parco A Neeta Storey 09/11/2023, 2:35 PM

## 2023-09-12 DIAGNOSIS — I639 Cerebral infarction, unspecified: Secondary | ICD-10-CM | POA: Diagnosis not present

## 2023-09-12 LAB — CBC
HCT: 33.9 % — ABNORMAL LOW (ref 39.0–52.0)
Hemoglobin: 11.8 g/dL — ABNORMAL LOW (ref 13.0–17.0)
MCH: 33.6 pg (ref 26.0–34.0)
MCHC: 34.8 g/dL (ref 30.0–36.0)
MCV: 96.6 fL (ref 80.0–100.0)
Platelets: 224 10*3/uL (ref 150–400)
RBC: 3.51 MIL/uL — ABNORMAL LOW (ref 4.22–5.81)
RDW: 13.3 % (ref 11.5–15.5)
WBC: 12.5 10*3/uL — ABNORMAL HIGH (ref 4.0–10.5)
nRBC: 0 % (ref 0.0–0.2)

## 2023-09-12 LAB — MAGNESIUM: Magnesium: 1.6 mg/dL — ABNORMAL LOW (ref 1.7–2.4)

## 2023-09-12 LAB — RENAL FUNCTION PANEL
Albumin: 2.5 g/dL — ABNORMAL LOW (ref 3.5–5.0)
Anion gap: 9 (ref 5–15)
BUN: 11 mg/dL (ref 6–20)
CO2: 23 mmol/L (ref 22–32)
Calcium: 8 mg/dL — ABNORMAL LOW (ref 8.9–10.3)
Chloride: 98 mmol/L (ref 98–111)
Creatinine, Ser: 0.81 mg/dL (ref 0.61–1.24)
GFR, Estimated: >60 mL/min (ref >60)
Glucose, Bld: 97 mg/dL (ref 70–99)
Phosphorus: 3 mg/dL (ref 2.5–4.6)
Potassium: 3.6 mmol/L (ref 3.5–5.1)
Sodium: 130 mmol/L — ABNORMAL LOW (ref 135–145)

## 2023-09-12 MED ORDER — SULFAMETHOXAZOLE-TRIMETHOPRIM 800-160 MG PO TABS: 1.0000 | ORAL_TABLET | Freq: Two times a day (BID) | ORAL | 0 refills | Status: DC

## 2023-09-12 MED ORDER — HEPARIN SOD (PORK) LOCK FLUSH 100 UNIT/ML IV SOLN
500.0000 [IU] | Freq: Once | INTRAVENOUS | Status: AC
  Administered 2023-09-12: 500 [IU] via INTRAVENOUS
  Filled 2023-09-12: qty 5

## 2023-09-12 MED ORDER — MAGNESIUM SULFATE 4 GM/100ML IV SOLN
4.0000 g | Freq: Once | INTRAVENOUS | Status: AC
  Administered 2023-09-12: 4 g via INTRAVENOUS
  Filled 2023-09-12: qty 100

## 2023-09-12 MED ORDER — APIXABAN 5 MG PO TABS: 5.0000 mg | ORAL_TABLET | Freq: Two times a day (BID) | ORAL | 3 refills | Status: DC

## 2023-09-12 NOTE — Progress Notes (Signed)
 NEUROLOGY CONSULT FOLLOW UP NOTE   Date of service: September 12, 2023 Patient Name: Samuel Choi MRN:  [id number] DOB:  1965-12-23  Interval Hx/subjective  Seen and examined Transferred out from the ICU No new complaints Left-sided symptoms resolved   Vitals   Vitals:   09/11/23 2200 09/12/23 0056 09/12/23 0400 09/12/23 0835  BP: 115/85 114/81 138/78 130/89  Pulse: (!) 109 (!) 104 (!) 107   Resp: 20 20  20   Temp: 98.9 F (37.2 C) 98.4 F (36.9 C) 98.7 F (37.1 C) 98.6 F (37 C)  TempSrc:   Oral   SpO2: 97% 96% 97% 98%  Weight:   52.4 kg   Height:         Body mass index is 15.24 kg/m.  Physical Exam   General: Awake alert in no distress HEENT: Normocephalic atraumatic Lungs: Clear Cardiovascular: Regular rate rhythm Neurological exam Awake alert oriented x 3.  No dysarthria.  No aphasia. Cranial nerves II to XII: With the exception of disc gaze at baseline, no other deficits. Motor examination with no drift Sensation today is intact to light touch all over Coordination exam reveals no dysmetria  Unchanged exam from yesterday  Medications  Current Facility-Administered Medications:    acetaminophen (TYLENOL) tablet 650 mg, 650 mg, Oral, Q4H PRN **OR** acetaminophen (TYLENOL) 160 MG/5ML solution 650 mg, 650 mg, Per Tube, Q4H PRN **OR** acetaminophen (TYLENOL) suppository 650 mg, 650 mg, Rectal, Q4H PRN, Milon Dikes, MD   apixaban Everlene Balls) tablet 5 mg, 5 mg, Oral, BID, Nazari, Walid A, RPH, 5 mg at 09/12/23 4098   aspirin EC tablet 81 mg, 81 mg, Oral, Daily, Milon Dikes, MD, 81 mg at 09/12/23 0824   cefTRIAXone (ROCEPHIN) 1 g in sodium chloride 0.9 % 100 mL IVPB, 1 g, Intravenous, Q24H, Harlon Ditty D, NP, Last Rate: 200 mL/hr at 09/11/23 1029, 1 g at 09/11/23 1029   Chlorhexidine Gluconate Cloth 2 % PADS 6 each, 6 each, Topical, Daily, Dgayli, Lianne Bushy, MD, 6 each at 09/11/23 1000   magnesium sulfate IVPB 4 g 100 mL, 4 g, Intravenous, Once, [place],  Micheline Rough L, NP, Last Rate: 50 mL/hr at 09/12/23 0824, 4 g at 09/12/23 0824   pantoprazole (PROTONIX) injection 40 mg, 40 mg, Intravenous, QHS, Milon Dikes, MD, 40 mg at 09/11/23 2123   senna-docusate (Senokot-S) tablet 1 tablet, 1 tablet, Oral, QHS PRN, Milon Dikes, MD   sodium chloride flush (NS) 0.9 % injection 10-40 mL, 10-40 mL, Intracatheter, Q12H, Dgayli, Lianne Bushy, MD, 10 mL at 09/11/23 2123   sodium chloride flush (NS) 0.9 % injection 10-40 mL, 10-40 mL, Intracatheter, PRN, Raechel Chute, MD  Facility-Administered Medications Ordered in Other Encounters:    dexamethasone (DECADRON) 10 MG/ML injection, , , ,    heparin lock flush 100 unit/mL, 500 Units, Intravenous, Once, Rickard Patience, MD   heparin lock flush 100 unit/mL, 500 Units, Intravenous, Once, Rickard Patience, MD   sodium chloride flush (NS) 0.9 % injection 10 mL, 10 mL, Intravenous, PRN, Rickard Patience, MD, 10 mL at 04/04/19 0903   sodium chloride flush (NS) 0.9 % injection 10 mL, 10 mL, Intravenous, PRN, Rickard Patience, MD, 10 mL at 07/26/20 1308   sodium chloride flush (NS) 0.9 % injection 10 mL, 10 mL, Intravenous, PRN, Rickard Patience, MD, 10 mL at 07/28/21 0828   sodium chloride flush (NS) 0.9 % injection 10 mL, 10 mL, Intravenous, PRN, Rickard Patience, MD, 10 mL at 12/21/22 1308  Labs and Diagnostic Imaging   CBC:  Recent Labs  Lab 09/10/23 1206 09/11/23 0406 09/12/23 0436  WBC 15.6* 13.1* 12.5*  NEUTROABS 12.7*  --   --   HGB 12.1* 11.5* 11.8*  HCT 35.4* 33.0* 33.9*  MCV 98.3 98.2 96.6  PLT 198 190 224    Basic Metabolic Panel:  Lab Results  Component Value Date   NA 130 (L) 09/12/2023   K 3.6 09/12/2023   CO2 23 09/12/2023   GLUCOSE 97 09/12/2023   BUN 11 09/12/2023   CREATININE 0.81 09/12/2023   CALCIUM 8.0 (L) 09/12/2023   GFRNONAA >60 09/12/2023   GFRAA >60 04/07/2020   Lipid Panel:  Lab Results  Component Value Date   LDLCALC 47 09/11/2023   HgbA1c:  Lab Results  Component Value Date   HGBA1C 4.8 09/11/2023   Urine Drug  Screen:     Component Value Date/Time   LABOPIA NONE DETECTED 09/10/2023 2304   COCAINSCRNUR NONE DETECTED 09/10/2023 2304   LABBENZ NONE DETECTED 09/10/2023 2304   AMPHETMU NONE DETECTED 09/10/2023 2304   THCU NONE DETECTED 09/10/2023 2304   LABBARB NONE DETECTED 09/10/2023 2304    Alcohol Level     Component Value Date/Time   ETH <10 09/10/2023 1206   INR  Lab Results  Component Value Date   INR 1.1 09/10/2023   APTT  Lab Results  Component Value Date   APTT 32 09/10/2023   CT Head without contrast(Personally reviewed): No acute intracranial process  CT angio Head and Neck with contrast(Personally reviewed): No ELVO  MRI Brain(Personally reviewed): Small acute infarct along the right precentral gyrus.  2D echo: LVEF 65 to 70%, moderate concentric LVH, RV function mildly reduced.  Small pericardial effusion.  Mitral valve normal.  Aortic valve tricuspid and aortic regurgitation not visualized, left atrium size normal, atrial septum is grossly normal  No evidence of A-fib on telemetry   Assessment   Samuel Choi is a 58 y.o. 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  In triage noted to have left-sided weakness involving the arm and leg for which code stroke was activated.  CT head personally reviewed by me-no acute changes.  Risks benefits and alternatives of IV TNKase discussed and he agreed. IV TNKase was given, monitored in the ICU with no complications.  Imaging Symptoms have since resolved  Impression: Acute ischemic stroke-etiology likely hypercoagulability due to underlying cancer  Recommendations  At this time, it is safe to resume anticoagulation.  I would recommend Eliquis.  Pharmacy is assisted in running a test claim-it would cost him $47 for a month supply.  I discussed with him the  importance of being on a blood thinner.  The pharmacy had also communicated with the cancer center pharmacist and felt that Eliquis is appropriate DOAC for him. Not on statin at home.  LDL at goal without statin-okay to not use statin at this time. Follow-up therapy assessments Follow-up with outpatient neurology in 8 to 12 weeks Follow-up with the cancer center as planned Inpatient neurology service will be available as needed. Plan was relayed to Dr. Sharlene Dory via secure chat. ______________________________________________________________________   Signed, Milon Dikes, MD [facility]

## 2023-09-12 NOTE — Progress Notes (Signed)
 Occupational Therapy Treatment Patient Details Name: Samuel Choi MRN: [id number] DOB: 11-19-1965 Today's Date: 09/12/2023   History of present illness 58 y.o. male with PMHx of squamous cell lung cancer, COPD, DVT and PE (supposed to be on Xarelto but reports not taking it due to affordability issues) who presented from [facility] with syncope and left sided weakness/sensory loss.  Concern for Acute Ischemic CVA, status post TNK 11:14.   OT comments  Pt seen for OT treatment on this date. Upon arrival to room pt supine in bed, agreeable to tx. Pt amb within the hallway with supervision ~229ft. Pt participated in IADL task during amb within the hallway, picking up items on the ground and returning to standing posture. Pt completed task with no noted LOB. Pt returned to room resting on EOB. Pt stated he was frustrated by not being able to have his personal inhaler during hospital stay. Pt oxygen levels were checked post amb; 91% on RA. Pt making good progress toward goals, will continue to follow POC. Discharge recommendation remains appropriate.        If plan is discharge home, recommend the following:  A little help with walking and/or transfers;A little help with bathing/dressing/bathroom;Assistance with cooking/housework   Equipment Recommendations  None recommended by OT    Recommendations for Other Services      Precautions / Restrictions Precautions Recall of Precautions/Restrictions: Intact Restrictions Weight Bearing Restrictions Per Provider Order: No       Mobility Bed Mobility Overal bed mobility: Needs Assistance Bed Mobility: Supine to Sit, Sit to Supine     Supine to sit: Modified independent (Device/Increase time) Sit to supine: Modified independent (Device/Increase time)        Transfers Overall transfer level: Needs assistance Equipment used:  (gate belt) Transfers: Sit to/from Stand, Bed to chair/wheelchair/BSC Sit to Stand: Modified independent  (Device/Increase time)           General transfer comment: No physical assistance required during t/f     Balance Overall balance assessment: Needs assistance Sitting-balance support: No upper extremity supported, Feet supported Sitting balance-Leahy Scale: Normal     Standing balance support: During functional activity, No upper extremity supported Standing balance-Leahy Scale: Good Standing balance comment: Improved standing balance noted during amb within hallway during vertical/horzontal head turns.                            ADL either performed or assessed with clinical judgement   ADL Overall ADL's : Modified independent                                       General ADL Comments: Pt completed bilateral adjusting of socks prior to amb; indep     Communication Communication Communication: No apparent difficulties   Cognition Arousal: Alert Behavior During Therapy: WFL for tasks assessed/performed Cognition: No apparent impairments             OT - Cognition Comments: Pt A/Ox4 throughout tx.                 Following commands: Intact        Cueing   Cueing Techniques: Verbal cues  Exercises Exercises: Other exercises Other Exercises Other Exercises: IADL; simulated tasks - "picking up around the house" Pt picked up items off the ground and stood back up with no LOB noted  throughout.       Shoulder Instructions       General Comments Pt eager to be d/c on this date, frustruated with the lack of his personal inhaler while in hospital.      Pertinent Vitals/ Pain       Pain Assessment Pain Assessment: No/denies pain  Home Living                                          Prior Functioning/Environment              Frequency  Min 1X/week        Progress Toward Goals  OT Goals(current goals can now be found in the care plan section)     Acute Rehab OT Goals Patient Stated Goal: to get  better OT Goal Formulation: With patient Time For Goal Achievement: 09/25/23 Potential to Achieve Goals: Good  Plan      Co-evaluation                 AM-PAC OT "6 Clicks" Daily Activity     Outcome Measure   Help from another person eating meals?: None Help from another person taking care of personal grooming?: A Little Help from another person toileting, which includes using toliet, bedpan, or urinal?: None Help from another person bathing (including washing, rinsing, drying)?: A Little Help from another person to put on and taking off regular upper body clothing?: None Help from another person to put on and taking off regular lower body clothing?: None 6 Click Score: 22    End of Session Equipment Utilized During Treatment: Gait belt  OT Visit Diagnosis: Unsteadiness on feet (R26.81);Other abnormalities of gait and mobility (R26.89);Muscle weakness (generalized) (M62.81)   Activity Tolerance Patient tolerated treatment well   Patient Left in bed;with call bell/phone within reach   Nurse Communication          Time: 4098-1191 OT Time Calculation (min): 13 min  Charges: OT General Charges $OT Visit: 1 Visit OT Treatments $Self Care/Home Management : 8-22 mins  Glenard Haring M.S. OTR/L  09/12/23, 4:25 PM

## 2023-09-12 NOTE — Discharge Summary (Signed)
 Physician Discharge Summary   Patient: Samuel Choi MRN: [id number] DOB: Mar 31, 1966  Admit date:     09/10/2023  Discharge date: 09/12/23  Discharge Physician: Deanna Artis   PCP: Barbette Reichmann, MD   Recommendations at discharge:   At this time patient will be discharged home.  If you experience any symptoms such as fever, vomiting, shortness of breath, chest pain, abdominal pain, or other concerning symptoms, please call your primary care provider or go to the emergency department immediately.  Discharge Diagnoses: Principal Problem:   Acute ischemic stroke [facility]) Active Problems:   At high risk for bleeding after thrombolytic therapy   Syncope   VTE (venous thromboembolism)  Resolved Problems:   * No resolved hospital problems. *  Hospital Course: Samuel Choi is a 58 y.o. 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  In triage noted to have left-sided weakness involving the arm and leg for which code stroke was activated.  CT head personally reviewed by me-no acute changes.  Risks benefits and alternatives of IV TNKase discussed and he agreed. IV TNKase was given, monitored in the ICU with no complications.  Imaging Symptoms have since resolved   Impression: Acute ischemic stroke-etiology likely hypercoagulability due to underlying cancer  Assessment and Plan:  Acute CVA status post TNK 3/3 -MRI noting right frontal lobe acute infarct.  Patient with history of DVT/PE as well as squamous cell lung cancer with likely hypercoagulability.  Evaluated by neurology.  Placed on Eliquis.  Having resolution of his hemiparesis/weakness.  Ambulatory, eager for discharge home.  Will provide prescription for Eliquis to take as directed upon discharge.  No need for statin at this time given LDL at goal.  Patient to follow-up  with outpatient neurology in 8-12 weeks.  Follow-up with outpatient oncology office.         Consultants: Neurology, critical care Procedures performed: None Disposition: Home Diet recommendation:  Discharge Diet Orders (From admission, onward)     Start     Ordered   09/12/23 0000  Diet - low sodium heart healthy        09/12/23 1057           Cardiac and Carb modified diet DISCHARGE MEDICATION: Allergies as of 09/12/2023   No Known Allergies      Medication List     STOP taking these medications    rivaroxaban 10 MG Tabs tablet Commonly known as: XARELTO       TAKE these medications    albuterol 108 (90 Base) MCG/ACT inhaler Commonly known as: VENTOLIN HFA INHALE 2 PUFFS BY MOUTH EVERY 6 HOURS AS NEEDED FOR WHEEZING OR SHORTNESS OF BREATH   albuterol (2.5 MG/3ML) 0.083% nebulizer solution Commonly known as: PROVENTIL Take 3 mLs (2.5 mg total) by nebulization every 6 (six) hours as needed for wheezing or shortness of breath.   apixaban 5 MG Tabs tablet Commonly known as: ELIQUIS Take 1 tablet (5 mg total) by mouth 2 (two) times daily.   benzonatate 200 MG capsule Commonly known as: TESSALON Take 1 capsule (200 mg total) by mouth 3 (three) times daily as needed for cough.   Breztri Aerosphere 160-9-4.8 MCG/ACT Aero Generic drug: Budeson-Glycopyrrol-Formoterol Inhale 2 puffs into the lungs in the morning and at bedtime.   cloNIDine 0.1 MG tablet Commonly known as: CATAPRES  Take 1 tablet (0.1 mg total) by mouth 3 (three) times daily.   cyanocobalamin 1000 MCG tablet Commonly known as: VITAMIN B12 Take 1 tablet (1,000 mcg total) by mouth daily.   diltiazem 120 MG 24 hr capsule Commonly known as: CARDIZEM CD Take 120 mg by mouth daily.   feeding supplement Liqd Take 237 mLs by mouth 3 (three) times daily between meals.   furosemide 40 MG tablet Commonly known as: Lasix Take 1 tablet (40 mg total) by mouth daily as needed for fluid or edema.    latanoprost 0.005 % ophthalmic solution Commonly known as: XALATAN Place 1 drop into both eyes at bedtime.   loratadine 10 MG tablet Commonly known as: CLARITIN TAKE 1 TABLET(10 MG) BY MOUTH DAILY AS NEEDED FOR ALLERGIES   magnesium chloride 64 MG Tbec SR tablet Commonly known as: SLOW-MAG Take 1 tablet (64 mg total) by mouth 2 (two) times daily.   metoprolol succinate 25 MG 24 hr tablet Commonly known as: TOPROL-XL Take 1 tablet (25 mg total) by mouth daily.   multivitamin with minerals Tabs tablet Take 1 tablet by mouth daily.   pantoprazole 20 MG tablet Commonly known as: PROTONIX TAKE 1 TABLET BY MOUTH DAILY   potassium chloride SA 20 MEQ tablet Commonly known as: KLOR-CON M Take 1 tablet (20 mEq total) by mouth daily.   sulfamethoxazole-trimethoprim 800-160 MG tablet Commonly known as: Bactrim DS Take 1 tablet by mouth 2 (two) times daily.        Discharge Exam: Filed Weights   09/10/23 1300 09/11/23 0400 09/12/23 0400  Weight: 54.4 kg 54.3 kg 52.4 kg   GENERAL:  Alert, pleasant, no acute distress  HEENT:  EOMI CARDIOVASCULAR:  RRR, no murmurs appreciated, RESPIRATORY:  Clear to auscultation, no wheezing, rales, or rhonchi GASTROINTESTINAL:  Soft, nontender, nondistended EXTREMITIES: Thin, no LE edema bilaterally NEURO:  No new focal deficits appreciated SKIN:  No rashes noted PSYCH:  Appropriate mood and affect    Condition at discharge: improving  The results of significant diagnostics from this hospitalization (including imaging, microbiology, ancillary and laboratory) are listed below for reference.   Imaging Studies: MR BRAIN WO CONTRAST Result Date: 09/11/2023 CLINICAL DATA:  Stroke follow-up EXAM: MRI HEAD WITHOUT CONTRAST TECHNIQUE: Multiplanar, multiecho pulse sequences of the brain and surrounding structures were obtained without intravenous contrast. COMPARISON:  07/01/2020 FINDINGS: Brain: There is a small area of abnormal diffusion  restriction along the anterior aspect of the right precentral gyrus. No acute or chronic hemorrhage. There is multifocal hyperintense T2-weighted signal within the white matter. Generalized volume loss. Cavum septum pellucidum et vergae. Vascular: Normal flow voids. Skull and upper cervical spine: Normal calvarium and skull base. Visualized upper cervical spine and soft tissues are normal. Sinuses/Orbits:No paranasal sinus fluid levels or advanced mucosal thickening. No mastoid or middle ear effusion. Normal orbits. Perforation (possibly postsurgical) of the nasal septum. IMPRESSION: 1. 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 No hemorrhage or mass effect. Electronically Signed   By: Deatra Robinson M.D.   On: 09/11/2023 16:55   ECHOCARDIOGRAM COMPLETE Result Date: 09/11/2023    ECHOCARDIOGRAM REPORT   Patient Name:   NAVI ERBER Date of Exam: 09/11/2023 Medical Rec #:  [id number]      Height:       73.0 in Accession #:    [id number]     Weight:       119.7 lb Date of Birth:  06/28/1966  BSA:          1.730 m Patient Age:    58 years       BP:           117/87 mmHg Patient Gender: M              HR:           100 bpm. Exam Location:  [facility] Procedure: 2D Echo, Cardiac Doppler and Color Doppler (Both Spectral and Color            Flow Doppler were utilized during procedure). Indications:     Stroke  History:         Patient has prior history of Echocardiogram examinations, most                  recent 06/30/2023. CHF, Stroke, Pulmonary HTN and COPD,                  Arrythmias:Tachycardia; Risk Factors:Hypertension and Former                  Smoker. Prot-A-Cath in place, Lung CA, Chemo, ETOH use.  Sonographer:     Mikki Harbor Referring Phys:  [id number] ASHISH ARORA Diagnosing Phys: Alwyn Pea MD  Sonographer Comments: Technically difficult study due to poor echo windows. Image acquisition challenging due to patient body habitus. IMPRESSIONS  1. Left ventricular  ejection fraction, by estimation, is >75%. The left ventricle has hyperdynamic function. The left ventricle has no regional wall motion abnormalities. Left ventricular diastolic parameters are consistent with Grade I diastolic dysfunction (impaired relaxation).  2. Right ventricular systolic function is moderately reduced. The right ventricular size is severely enlarged. Moderately increased right ventricular wall thickness. There is severely elevated pulmonary artery systolic pressure.  3. Right atrial size was mildly dilated.  4. The mitral valve is grossly normal. Mild mitral valve regurgitation.  5. 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  6. The aortic valve is normal in structure. Aortic valve regurgitation is not visualized. FINDINGS  Left Ventricle: Left ventricular ejection fraction, by estimation, is >75%. The left ventricle has hyperdynamic function. The left ventricle has no regional wall motion abnormalities. Strain was performed and the global longitudinal strain is indeterminate. Global longitudinal strain performed but not reported based on interpreter judgement due to suboptimal tracking. The left ventricular internal cavity size was normal in size. There is no left ventricular hypertrophy. Left ventricular diastolic parameters are consistent with Grade I diastolic dysfunction (impaired relaxation). Right Ventricle: The right ventricular size is severely enlarged. Moderately increased right ventricular wall thickness. Right ventricular systolic function is moderately reduced. There is severely elevated pulmonary artery systolic pressure. The tricuspid regurgitant velocity is 4.76 m/s, and with an assumed right atrial pressure of 15 mmHg, the estimated right ventricular systolic pressure is 105.6 mmHg. Left Atrium: Left atrial size was normal in size. Right Atrium: Right atrial size was mildly dilated. Pericardium: There is no evidence of pericardial  effusion. Mitral Valve: The mitral valve is grossly normal. Mild mitral valve regurgitation. MV peak gradient, 4.1 mmHg. The mean mitral valve gradient is 2.0 mmHg. 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 Aortic Valve: The aortic valve is normal in structure. Aortic valve regurgitation is not visualized. Aortic valve mean gradient measures 3.0 mmHg. Aortic valve peak gradient measures 7.1 mmHg. Aortic valve area, by VTI measures 3.49 cm. Pulmonic Valve: The pulmonic valve was  grossly normal. Pulmonic valve regurgitation is trivial. Aorta: The ascending aorta was not well visualized. IAS/Shunts: No atrial level shunt detected by color flow Doppler. Additional Comments: 3D was performed not requiring image post processing on an independent workstation and was indeterminate. A venous catheter is visualized.  LEFT VENTRICLE PLAX 2D LVIDd:         4.00 cm     Diastology LVIDs:         2.00 cm     LV e' medial:    5.66 cm/s LV PW:         1.00 cm     LV E/e' medial:  11.4 LV IVS:        1.00 cm     LV e' lateral:   9.68 cm/s LVOT diam:     2.00 cm     LV E/e' lateral: 6.7 LV SV:         59 LV SV Index:   34 LVOT Area:     3.14 cm  LV Volumes (MOD) LV vol d, MOD A2C: 28.3 ml LV vol d, MOD A4C: 20.6 ml LV vol s, MOD A2C: 9.6 ml LV vol s, MOD A4C: 9.2 ml LV SV MOD A2C:     18.7 ml LV SV MOD A4C:     20.6 ml LV SV MOD BP:      14.1 ml RIGHT VENTRICLE RV Basal diam:  4.45 cm RV Mid diam:    5.10 cm RV S prime:     13.70 cm/s TAPSE (M-mode): 2.6 cm LEFT ATRIUM             Index        RIGHT ATRIUM           Index LA diam:        2.60 cm 1.50 cm/m   RA Area:     19.30 cm LA Vol (A2C):   29.0 ml 16.76 ml/m  RA Volume:   57.10 ml  33.01 ml/m LA Vol (A4C):   9.8 ml  5.65 ml/m LA Biplane Vol: 16.2 ml 9.36 ml/m  AORTIC VALVE                    PULMONIC VALVE AV Area (Vmax):    2.74 cm     PV Vmax:       0.80 m/s AV Area (Vmean):   2.50 cm     PV Peak grad:   2.5 mmHg AV Area (VTI):     3.49 cm AV Vmax:           133.00 cm/s AV Vmean:          80.700 cm/s AV VTI:            0.170 m AV Peak Grad:      7.1 mmHg AV Mean Grad:      3.0 mmHg LVOT Vmax:         116.00 cm/s LVOT Vmean:        64.100 cm/s LVOT VTI:          0.189 m LVOT/AV VTI ratio: 1.11  AORTA Ao Root diam: 3.80 cm MITRAL VALVE                TRICUSPID VALVE MV Area (PHT): 5.79 cm     TR Peak grad:   90.6 mmHg MV Area VTI:   4.09 cm     TR Vmax:        476.00 cm/s  MV Peak grad:  4.1 mmHg MV Mean grad:  2.0 mmHg     SHUNTS MV Vmax:       1.01 m/s     Systemic VTI:  0.19 m MV Vmean:      61.6 cm/s    Systemic Diam: 2.00 cm MV Decel Time: 131 msec MV E velocity: 64.40 cm/s MV A velocity: 101.00 cm/s MV E/A ratio:  0.64 Dwayne Salome Arnt MD Electronically signed by Alwyn Pea MD Signature Date/Time: 09/11/2023/10:59:35 AM    Final    DG Chest Port 1 View Result Date: 09/10/2023 CLINICAL DATA:  Leukocytosis EXAM: PORTABLE CHEST 1 VIEW COMPARISON:  Chest radiograph 06/15/2023 FINDINGS: Port-A-Cath tip projects over the superior vena cava. Monitoring leads overlie the patient. Stable cardiac and mediastinal contours. Redemonstrated postsurgical changes right hemithorax. Redemonstrated nodular area of consolidation right mid lung. Left lung is clear. No pneumothorax. IMPRESSION: Redemonstrated nodular area of consolidation right mid lung. This was described on prior CT in may represent superimposed infectious process including fungal ball. Patient needs short-term follow-up chest CT for re-evaluation. Electronically Signed   By: Annia Belt M.D.   On: 09/10/2023 16:10   CT ANGIO HEAD NECK W WO CM (CODE STROKE) Result Date: 09/10/2023 CLINICAL DATA:  Provided history: Neuro deficit, acute, stroke suspected. EXAM: CT ANGIOGRAPHY HEAD AND NECK WITH AND WITHOUT CONTRAST TECHNIQUE: Multidetector CT imaging of the head and neck was performed using the standard protocol during bolus administration of intravenous  contrast. Multiplanar CT image reconstructions and MIPs were obtained to evaluate the vascular anatomy. Carotid stenosis measurements (when applicable) are obtained utilizing NASCET criteria, using the distal internal carotid diameter as the denominator. RADIATION DOSE REDUCTION: This exam was performed according to the departmental dose-optimization program which includes automated exposure control, adjustment of the mA and/or kV according to patient size and/or use of iterative reconstruction technique. CONTRAST:  75mL OMNIPAQUE IOHEXOL 350 MG/ML SOLN COMPARISON:  Non-contrast head CT performed earlier today 09/10/2023. FINDINGS: CTA NECK FINDINGS Aortic arch: Common origin of the innominate and left common carotid arteries. Atherosclerotic plaque within the visualized thoracic aorta and proximal major branch vessels of the neck. No hemodynamically significant innominate or proximal subclavian artery stenosis. Right carotid system: CCA and ICA patent within the neck without hemodynamically significant stenosis (50% or greater). Atherosclerotic plaque scattered within the CCA, but the carotid bifurcation and within the proximal ICA. Partially retropharyngeal course of the cervical ICA. Left carotid system: CCA and ICA patent within the neck without hemodynamically significant stenosis (50% or greater). Atherosclerotic plaque scattered within the CCA, about the carotid bifurcation and within the proximal ICA. 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 The left vertebral artery is patent within the neck without stenosis or significant atherosclerotic disease. Skeleton: Cervical spondylosis.  Poor dentition. Other neck: No neck mass or cervical lymphadenopathy. Upper chest: No consolidation within the imaged lung apices. Right upper lobe pulmonary scarring. Fluid within the visualized upper thoracic esophagus. Review of the MIP images confirms the above findings CTA HEAD FINDINGS  Anterior circulation: The intracranial internal carotid arteries are patent. Atherosclerotic plaque within both vessels with no more than mild stenosis. The M1 middle cerebral arteries are patent. No M2 proximal branch occlusion or high-grade proximal stenosis. The anterior cerebral arteries are patent. No intracranial aneurysm is identified. Posterior circulation: There is reconstitution of enhancement within the very distal right vertebral artery V4 segment, likely due to retrograde flow. Enhancement is present within the proximal right posterior  inferior cerebellar artery (which may arise from the left vertebral artery). The intracranial left vertebral artery is patent without stenosis. The basilar artery is patent. The posterior cerebral arteries are patent. Hypoplastic P1 segments with sizable posterior communicating arteries, bilaterally. Venous sinuses: Within the limitations of contrast timing, no convincing thrombus. As described Anatomic variants: As described. Review of the MIP images confirms the above findings Impression #1 called by telephone at the time of interpretation on 09/10/2023 at 12:07 pm to provider [facility] , who verbally acknowledged these results. IMPRESSION: CTA neck: 1. 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 2. The common carotid and internal carotid arteries are patent within the neck without hemodynamically significant stenosis. Atherosclerotic plaque bilaterally, as described. 3. Aortic Atherosclerosis (ICD10-I70.0). CTA head: 1. There is reconstitution of enhancement within the very distal intracranial right vertebral artery, likely due to retrograde flow. Enhancement is also present within the proximal right posterior inferior cerebellar artery (which may arise from the left vertebral artery). 2. Non-stenotic atherosclerotic plaque within the intracranial internal carotid arteries. Electronically Signed   By: Jackey Loge D.O.   On: 09/10/2023 12:16   CT HEAD CODE  STROKE WO CONTRAST Result Date: 09/10/2023 CLINICAL DATA:  Code stroke. Provided history: Neuro deficit, acute, stroke suspected. EXAM: CT HEAD WITHOUT CONTRAST TECHNIQUE: Contiguous axial images were obtained from the base of the skull through the vertex without intravenous contrast. RADIATION DOSE REDUCTION: This exam was performed according to the departmental dose-optimization program which includes automated exposure control, adjustment of the mA and/or kV according to patient size and/or use of iterative reconstruction technique. COMPARISON:  Brain MRI 07/01/2020. FINDINGS: Brain: Generalized cerebral atrophy. Patchy and ill-defined hypoattenuation within the cerebral white matter, nonspecific but compatible with moderate chronic small vessel ischemic disease. Cavum septum pellucidum and cavum vergae (anatomic variant). There is no acute intracranial hemorrhage. No demarcated cortical infarct. No extra-axial fluid collection. No evidence of an intracranial mass. No midline shift. Vascular: No hyperdense vessel.  Atherosclerotic calcifications. Skull: No calvarial fracture or aggressive osseous lesion. Sinuses/Orbits: No mass or acute finding within the imaged orbits. Postsurgical appearance of the paranasal sinuses. Incompletely imaged mucosal thickening within the right maxillary sinus (with associated chronic reactive osteitis). Trace mucosal thickening within the left maxillary sinus at the imaged levels (with associated chronic reactive osteitis). Mild right ethmoid sinusitis. Mild mucosal thickening within the left ethmoidectomy cavity. ASPECTS [facility] Stroke Program Early CT Score) - Ganglionic level infarction (caudate, lentiform nuclei, internal capsule, insula, M1-M3 cortex): 7 - Supraganglionic infarction (M4-M6 cortex): 3 Total score (0-10 with 10 being normal): 10 No evidence of an acute intracranial abnormality. These results were communicated to Dr. Wilford Corner at 11:53 amon 3/3/2025by text page via  the [facility] messaging system. IMPRESSION: 1.  No evidence of an acute intracranial abnormality. 2. Parenchymal atrophy and chronic small vessel ischemic disease. 3. Paranasal sinus disease at the imaged levels, as described. Electronically Signed   By: Jackey Loge D.O.   On: 09/10/2023 11:53   CT CHEST ABDOMEN PELVIS W CONTRAST Result Date: 09/03/2023 CLINICAL DATA:  * Tracking Code: BO * squamous cell carcinoma of right lung. Status post thoracotomy and wedge resection of right upper lobe lung mass in 2020. Status post chemotherapy and radiation therapy. EXAM: CT CHEST, ABDOMEN, AND PELVIS WITH CONTRAST TECHNIQUE: Multidetector CT imaging of the chest, abdomen and pelvis was performed following the standard protocol during bolus administration of intravenous contrast. RADIATION DOSE REDUCTION: This exam was performed according to the departmental dose-optimization program which  includes automated exposure control, adjustment of the mA and/or kV according to patient size and/or use of iterative reconstruction technique. CONTRAST:  OMNIPAQUE IOHEXOL 300 MG/ML  SOLN COMPARISON:  02/23/2023 staging CTs.  Interval CTA chest 06/14/2023. FINDINGS: CT CHEST FINDINGS Cardiovascular: Left Port-A-Cath tip low SVC. SVC filling defect along the distal most portion of the Port-A-Cath included on 31/2, similar. Aortic atherosclerosis. Normal heart size, without pericardial effusion. Lad coronary artery calcification. No central pulmonary embolism, on this non-dedicated study. Mediastinum/Nodes: No supraclavicular adenopathy. No mediastinal or hilar adenopathy. The prevascular adenopathy on the prior exam has resolved, with a node measuring 6 mm today. Lungs/Pleura: Trace right pleural fluid is unchanged. Advanced centrilobular emphysema. Status post right upper lobectomy. Right perihilar radiation fibrosis again identified with volume loss, consolidation, and traction bronchiectasis. Anterior right lung base cystic  bronchiectasis again identified. nodularity within measures 1.6 x 1.1 by 1.1 cm on 94/4 versus 1.5 x 0.9 by 1.3 cm on the prior. The nodularity just lateral to the cystic bronchiectasis measures maximally 9 mm on 89/4 versus 12 mm on the prior. Diffuse micronodularity is again identified, favoring smoking related respiratory bronchiolitis. The right middle lobe consolidation has resolved. Musculoskeletal: Included within the abdomen pelvic section. CT ABDOMEN PELVIS FINDINGS Hepatobiliary: Normal liver. Normal gallbladder, without biliary ductal dilatation. Pancreas: Normal, without mass or ductal dilatation. Spleen: Normal in size, without focal abnormality. Adrenals/Urinary Tract: Normal adrenal glands. Left renal cysts of up to 1.0 cm . In the absence of clinically indicated signs/symptoms require(s) no independent follow-up. Minimal right renal cortical scarring. The bladder is moderately thick walled with mucosal hyperenhancement on 108/2. Air in the nondependent bladder. Stomach/Bowel: Normal stomach, without wall thickening. Extensive colonic diverticulosis. Mild sigmoid wall thickening most likely represents muscular hypertrophy. There is minimal gas and stool like material on 103/2, superior and medial to the sigmoid lumen. This is contiguous with the bladder dome including on coronal image 58. Findings are new since 02/23/2023. Normal terminal ileum and appendix.  Normal small bowel. Vascular/Lymphatic: Advanced aortic and branch vessel atherosclerosis. No abdominopelvic adenopathy. Reproductive: Normal prostate. Other: No significant free fluid.  No free intraperitoneal air. Musculoskeletal: Grade 1-2 L5-S1 anterolisthesis. IMPRESSION: 1. Status post right upper lobectomy. 2. An area of cystic bronchiectasis in the right lung base demonstrates nodularity within, felt to be relatively similar to 06/14/2023. This could represent superimposed infection, including fungal ball. Metastatic disease is felt less  likely, especially given alteration of shaped/position today. Consider chest CT follow-up at 3 months. 3. Otherwise, no evidence of recurrent or metastatic disease. 4. Findings highly suspicious for colovesical fistula and superimposed cystitis. Likely secondary to interval episode of diverticulitis. 5. Aortic atherosclerosis (ICD10-I70.0), coronary artery atherosclerosis and emphysema (ICD10-J43.9). 6. Trace right pleural fluid 7. Persistent filling defect along the distal Port-A-Cath, which could represent adherent thrombus. These results will be called to the ordering clinician or representative by the Radiologist Assistant, and communication documented in the PACS or [employer]. Electronically Signed   By: Jeronimo Greaves M.D.   On: 09/03/2023 10:07    Microbiology: Results for orders placed or performed during the hospital encounter of 09/10/23  MRSA Next Gen by PCR, Nasal     Status: None   Collection Time: 09/10/23  1:36 PM   Specimen: Nasal Mucosa; Nasal Swab  Result Value Ref Range Status   MRSA by PCR Next Gen NOT DETECTED NOT DETECTED Final    Comment: (NOTE) The GeneXpert MRSA Assay (FDA approved for NASAL specimens only), is one  component of a comprehensive MRSA colonization surveillance program. It is not intended to diagnose MRSA infection nor to guide or monitor treatment for MRSA infections. Test performance is not FDA approved in patients less than 84 years old. Performed at [facility], [address], [place], [place] [address]   Urine Culture     Status: Abnormal (Preliminary result)   Collection Time: 09/10/23 11:03 PM   Specimen: Urine, Random  Result Value Ref Range Status   Specimen Description   Final    URINE, RANDOM Performed at [facility], [address], [place], [place] [address]    Special Requests   Final    NONE Performed at [facility], [address] [place], [place] [address]    Culture (A)  Final    >=100,000  COLONIES/mL ESCHERICHIA COLI SUSCEPTIBILITIES TO FOLLOW Performed at [facility], [address] [place], [place] [address]    Report Status PENDING  Incomplete    Labs: CBC: Recent Labs  Lab 09/10/23 1206 09/11/23 0406 09/12/23 0436  WBC 15.6* 13.1* 12.5*  NEUTROABS 12.7*  --   --   HGB 12.1* 11.5* 11.8*  HCT 35.4* 33.0* 33.9*  MCV 98.3 98.2 96.6  PLT 198 190 224   Basic Metabolic Panel: Recent Labs  Lab 09/10/23 0941 09/10/23 1206 09/11/23 0406 09/12/23 0436  NA  --  130* 130* 130*  K  --  4.5 4.1 3.6  CL  --  101 100 98  CO2  --  21* 23 23  GLUCOSE  --  123* 111* 97  BUN  --  12 12 11   CREATININE  --  0.90 1.02 0.81  CALCIUM  --  8.6* 8.6* 8.0*  MG 1.5*  --  1.4* 1.6*  PHOS  --   --  2.8 3.0   Liver Function Tests: Recent Labs  Lab 09/10/23 1206 09/11/23 0406 09/12/23 0436  AST 15  --   --   ALT 11  --   --   ALKPHOS 102  --   --   BILITOT 0.9  --   --   PROT 6.7  --   --   ALBUMIN 2.6* 2.6* 2.5*   CBG: Recent Labs  Lab 09/10/23 1119 09/10/23 1300 09/11/23 0757  GLUCAP 84 88 105*    Discharge time spent: less than 30 minutes.  Signed: Deanna Artis, DO [facility] 09/12/2023

## 2023-09-12 NOTE — Plan of Care (Signed)

## 2023-09-13 ENCOUNTER — Other Ambulatory Visit: Payer: Self-pay | Admitting: Internal Medicine

## 2023-09-13 DIAGNOSIS — I639 Cerebral infarction, unspecified: Secondary | ICD-10-CM

## 2023-09-13 LAB — URINE CULTURE: Culture: >=100000 — AB

## 2023-09-17 ENCOUNTER — Telehealth: Payer: Self-pay

## 2023-09-17 NOTE — Telephone Encounter (Signed)
 Form completed and faxed to [employer]/ Leave ID [id number]

## 2023-09-17 NOTE — Telephone Encounter (Signed)
 FMLA request for intermittent leave for patient's spouse received.

## 2023-10-15 ENCOUNTER — Inpatient Hospital Stay: Payer: Medicare Other | Attending: Oncology

## 2023-10-15 DIAGNOSIS — C3491 Malignant neoplasm of unspecified part of right bronchus or lung: Secondary | ICD-10-CM | POA: Diagnosis present

## 2023-10-15 DIAGNOSIS — Z95828 Presence of other vascular implants and grafts: Secondary | ICD-10-CM

## 2023-10-15 DIAGNOSIS — Z452 Encounter for adjustment and management of vascular access device: Secondary | ICD-10-CM | POA: Diagnosis present

## 2023-10-15 MED ORDER — SODIUM CHLORIDE 0.9% FLUSH
10.0000 mL | Freq: Once | INTRAVENOUS | Status: AC
  Administered 2023-10-15: 10 mL via INTRAVENOUS
  Filled 2023-10-15: qty 10

## 2023-10-15 MED ORDER — HEPARIN SOD (PORK) LOCK FLUSH 100 UNIT/ML IV SOLN
500.0000 [IU] | Freq: Once | INTRAVENOUS | Status: AC
  Administered 2023-10-15: 500 [IU] via INTRAVENOUS
  Filled 2023-10-15: qty 5

## 2023-10-19 ENCOUNTER — Other Ambulatory Visit: Payer: Self-pay

## 2023-11-09 ENCOUNTER — Other Ambulatory Visit: Payer: Self-pay | Admitting: Oncology

## 2023-11-13 ENCOUNTER — Encounter: Payer: Self-pay | Admitting: Oncology

## 2023-12-04 ENCOUNTER — Ambulatory Visit
Admission: RE | Admit: 2023-12-04 | Discharge: 2023-12-04 | Disposition: A | Discharge status: Home / Self Care | Payer: Medicare Other | Source: Ambulatory Visit | Attending: Oncology | Admitting: Oncology

## 2023-12-04 DIAGNOSIS — C3491 Malignant neoplasm of unspecified part of right bronchus or lung: Secondary | ICD-10-CM | POA: Diagnosis present

## 2023-12-07 ENCOUNTER — Encounter: Payer: Self-pay | Admitting: Oncology

## 2023-12-07 ENCOUNTER — Inpatient Hospital Stay: Payer: Medicare Other

## 2023-12-07 ENCOUNTER — Inpatient Hospital Stay: Payer: Medicare Other | Attending: Oncology | Admitting: Oncology

## 2023-12-07 VITALS — BP 123/76 | HR 91 | Temp 96.3°F | Resp 18 | Wt 126.8 lb

## 2023-12-07 DIAGNOSIS — R300 Dysuria: Secondary | ICD-10-CM | POA: Diagnosis not present

## 2023-12-07 DIAGNOSIS — Z95828 Presence of other vascular implants and grafts: Secondary | ICD-10-CM

## 2023-12-07 DIAGNOSIS — N3000 Acute cystitis without hematuria: Secondary | ICD-10-CM

## 2023-12-07 DIAGNOSIS — N321 Vesicointestinal fistula: Secondary | ICD-10-CM

## 2023-12-07 DIAGNOSIS — Z7901 Long term (current) use of anticoagulants: Secondary | ICD-10-CM | POA: Diagnosis not present

## 2023-12-07 DIAGNOSIS — C3491 Malignant neoplasm of unspecified part of right bronchus or lung: Secondary | ICD-10-CM

## 2023-12-07 DIAGNOSIS — Z9221 Personal history of antineoplastic chemotherapy: Secondary | ICD-10-CM | POA: Insufficient documentation

## 2023-12-07 DIAGNOSIS — Z902 Acquired absence of lung [part of]: Secondary | ICD-10-CM | POA: Diagnosis not present

## 2023-12-07 DIAGNOSIS — Z87891 Personal history of nicotine dependence: Secondary | ICD-10-CM | POA: Diagnosis not present

## 2023-12-07 DIAGNOSIS — E876 Hypokalemia: Secondary | ICD-10-CM

## 2023-12-07 DIAGNOSIS — Z86711 Personal history of pulmonary embolism: Secondary | ICD-10-CM | POA: Insufficient documentation

## 2023-12-07 DIAGNOSIS — Z923 Personal history of irradiation: Secondary | ICD-10-CM | POA: Insufficient documentation

## 2023-12-07 DIAGNOSIS — Z87440 Personal history of urinary (tract) infections: Secondary | ICD-10-CM | POA: Diagnosis not present

## 2023-12-07 DIAGNOSIS — R634 Abnormal weight loss: Secondary | ICD-10-CM

## 2023-12-07 DIAGNOSIS — K5730 Diverticulosis of large intestine without perforation or abscess without bleeding: Secondary | ICD-10-CM

## 2023-12-07 DIAGNOSIS — Z86718 Personal history of other venous thrombosis and embolism: Secondary | ICD-10-CM

## 2023-12-07 DIAGNOSIS — I639 Cerebral infarction, unspecified: Secondary | ICD-10-CM

## 2023-12-07 DIAGNOSIS — N390 Urinary tract infection, site not specified: Secondary | ICD-10-CM | POA: Insufficient documentation

## 2023-12-07 LAB — URINALYSIS, COMPLETE (UACMP) WITH MICROSCOPIC
Bilirubin Urine: NEGATIVE
Glucose, UA: NEGATIVE mg/dL
Ketones, ur: NEGATIVE mg/dL
Nitrite: NEGATIVE
Protein, ur: 100 mg/dL — AB
Specific Gravity, Urine: 1.015 (ref 1.005–1.030)
WBC, UA: >50 WBC/hpf (ref 0–5)
pH: 6 (ref 5.0–8.0)

## 2023-12-07 LAB — BASIC METABOLIC PANEL - CANCER CENTER ONLY
Anion gap: 10 (ref 5–15)
BUN: 7 mg/dL (ref 6–20)
CO2: 24 mmol/L (ref 22–32)
Calcium: 8.1 mg/dL — ABNORMAL LOW (ref 8.9–10.3)
Chloride: 101 mmol/L (ref 98–111)
Creatinine: 0.91 mg/dL (ref 0.61–1.24)
GFR, Estimated: >60 mL/min (ref >60)
Glucose, Bld: 105 mg/dL — ABNORMAL HIGH (ref 70–99)
Potassium: 2.9 mmol/L — ABNORMAL LOW (ref 3.5–5.1)
Sodium: 135 mmol/L (ref 135–145)

## 2023-12-07 LAB — MAGNESIUM: Magnesium: 1.2 mg/dL — ABNORMAL LOW (ref 1.7–2.4)

## 2023-12-07 MED ORDER — SODIUM CHLORIDE 0.9 % IV SOLN
INTRAVENOUS | Status: DC
  Filled 2023-12-07: qty 250

## 2023-12-07 MED ORDER — HEPARIN SOD (PORK) LOCK FLUSH 100 UNIT/ML IV SOLN
500.0000 [IU] | Freq: Once | INTRAVENOUS | Status: AC
  Administered 2023-12-07: 500 [IU] via INTRAVENOUS
  Filled 2023-12-07: qty 5

## 2023-12-07 MED ORDER — MAGNESIUM SULFATE 2 GM/50ML IV SOLN
2.0000 g | Freq: Once | INTRAVENOUS | Status: AC
  Administered 2023-12-07: 2 g via INTRAVENOUS
  Filled 2023-12-07: qty 50

## 2023-12-07 MED ORDER — POTASSIUM CHLORIDE 20 MEQ/100ML IV SOLN
20.0000 meq | Freq: Once | INTRAVENOUS | Status: AC
  Administered 2023-12-07: 20 meq via INTRAVENOUS

## 2023-12-07 MED ORDER — CIPROFLOXACIN HCL 250 MG PO TABS: 250.0000 mg | ORAL_TABLET | Freq: Two times a day (BID) | ORAL | 0 refills | Status: DC

## 2023-12-07 MED ORDER — POTASSIUM CHLORIDE CRYS ER 20 MEQ PO TBCR
20.0000 meq | EXTENDED_RELEASE_TABLET | Freq: Two times a day (BID) | ORAL | 0 refills | Status: AC
Start: 2023-12-07 — End: ?

## 2023-12-07 NOTE — Assessment & Plan Note (Signed)
 Patient is on Eliquis  5 mg twice daily for anticoagulation.

## 2023-12-07 NOTE — Assessment & Plan Note (Signed)
 Unprovoked left lower extremity DVT, status post thrombolysis/thrombectomy, Nonocclusive PE  Previously on Eliquis  2.5 mg twice daily, switched to Xarelto  10 mg daily due to financial cost. Now he is back on Eliquis  5 mg twice daily, started during his recent hospitalization after acute CVA.  Recommended by neurology.

## 2023-12-07 NOTE — Assessment & Plan Note (Signed)
 UA is positive. Urine culture is pending.  Recommend empiric antibiotics with Cipro 250mg  BID.

## 2023-12-07 NOTE — Progress Notes (Addendum)
 Hematology/Oncology Progress note [phone] [phone]    Reason for Visit  Follow up for lung cancer treatments.   ASSESSMENT & PLAN:   Cancer Staging  Squamous cell carcinoma of right lung [facility]) Staging form: Lung, AJCC 8th Edition - Clinical stage from 07/08/2020: Stage IVA (rcT4, cM1a) - Signed by Timmy Forbes, MD on 07/08/2020   Squamous cell carcinoma of right lung (HCC) 07/16/2020-09/28/2020 carboplatin  Taxol  and Keytruda  x 1  partial response -Keytruda  maintenance.-04/22/2021, progression-resumed on carboplatin /Taxol /Keytruda  - did not tolerate due to myelosuppression.-->  Taxol  held, carboplatin /Keytruda --> CT 07/2020 progression --> 08/03/2021 started docetaxel  and ramucirumab .-->confirmed to have necrotizing pneumonia pseudomonas--> chemo discontinued.  Labs are reviewed and discussed with patient. CT scan showed no signs of cancer progression. -Stable and new right lung base nodularity, close monitor. Repeat CT chest abdomen pelvis in 3 months.   Colovesical fistula Extensive colonic diverticulosis with chronic colovesical fistula  Case was previously discussed with Dr. Ace Holder and surgery Dr. Charmel Cooter. At that time, patient was asymptomatic.  Was recommended for observation. Now he starts to have chronic UTI symptoms.  UA is positive. Will start patient with empiric antibiotics.  Will further discuss case with urology and surgery.  History of DVT and PE Unprovoked left lower extremity DVT, status post thrombolysis/thrombectomy, Nonocclusive PE  Previously on Eliquis  2.5 mg twice daily, switched to Xarelto  10 mg daily due to financial cost. Now he is back on Eliquis  5 mg twice daily, started during his recent hospitalization after acute CVA.  Recommended by neurology.  Hypomagnesemia Chronic hypomagnesia Recommend slow mag 1 tab, continue twice daily IV mag 2g x 1    Port-A-Cath in place Continue port flush every 6- 8 weeks.   Weight loss Possibly  due to recent CVA hospitalization, UTI. He declined nutritionist evaluation.  Recommend nutrition supplements.   UTI (urinary tract infection) UA is positive. Urine culture is pending.  Recommend empiric antibiotics with Cipro 250mg  BID.    Acute ischemic stroke [facility]) Patient is on Eliquis  5 mg twice daily for anticoagulation.   Orders Placed This Encounter  Procedures   Urine Culture    Standing Status:   Future    Number of Occurrences:   1    Expected Date:   12/07/2023    Expiration Date:   12/06/2024   CT CHEST ABDOMEN PELVIS W CONTRAST    Standing Status:   Future    Expected Date:   03/08/2024    Expiration Date:   12/06/2024    If indicated for the ordered procedure, I authorize the administration of contrast media per Radiology protocol:   Yes    Does the patient have a contrast media/X-ray dye allergy?:   No    Preferred imaging location?:   [facility]    If indicated for the ordered procedure, I authorize the administration of oral contrast media per Radiology protocol:   Yes   Urinalysis, Complete w Microscopic    Standing Status:   Future    Number of Occurrences:   1    Expected Date:   12/07/2023    Expiration Date:   12/06/2024   Basic Metabolic Panel - Cancer Center Only    Standing Status:   Future    Expected Date:   12/14/2023    Expiration Date:   12/06/2024   Magnesium     Standing Status:   Future    Expected Date:   12/14/2023    Expiration Date:   12/06/2024   CBC with Differential (  Cancer Center Only)    Standing Status:   Future    Expected Date:   03/08/2024    Expiration Date:   12/06/2024   CMP (Cancer Center only)    Standing Status:   Future    Expected Date:   03/08/2024    Expiration Date:   12/06/2024   Magnesium     Standing Status:   Future    Expected Date:   03/08/2024    Expiration Date:   12/06/2024   Ambulatory referral to Urology    Referral Priority:   Urgent    Referral Type:   Consultation    Referral Reason:   Specialty  Services Required    Requested Specialty:   Urology    Number of Visits Requested:   1    Follow-up 3 months  All questions were answered. The patient knows to call the clinic with any problems, questions or concerns.  Timmy Forbes, MD, PhD [facility] Hematology Oncology 12/07/2023     HPI  Oncology History  Squamous cell carcinoma of right lung [facility])  08/13/2018 Surgery   s/p Bronchoscopy on 06/25/2018. subcarina EBUS FNA was non diagnostic, hypocellular specimen.  08/13/18 Patient underwent right thoracotomy and wedge resection of a right upper lobe mass.  Frozen section was consistent with an inflammatory process.  On the second postop day, preliminary pathology reports high-grade malignancy.  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  08/20/2018 Patient was therefore taken back to the OR and underwent take complete lobectomy  pT1b pN0 cM0 stage I squamous cell lung cancer. Margin is negative.  Recommend observation.     08/13/2018 Initial Diagnosis   Stage I Squamous cell carcinoma of lung, right (HCC)   12/03/2018 Progression   12/03/2018 CT chest showed abnormal soft tissue in the right suprahilar region measuring up to 2.5 cm, worrisome for recurrence 12/10/2018 PET Hypermetabolic RIGHT suprahilar mass consistent with lung cancer, recurrence. 12/26/2018 s/p bronchoscopy biopsy. Confirmed local recurrence of squamous cell lung cancer.  medi port placed by Dr.Oaks. 01/20/19 MRI brain w/wo contrast: No evidence of metastatic disease   01/17/2019 - 03/07/2019 Chemotherapy   concurrent chemoradiation on 01/16/2019 Carboplatin  AUC of 2 and Taxol  45 mg/m2 weekly finished in 03/05/2019    03/31/2019 Imaging   03/31/19 CT chest showed Right suprahilar lesion has decreased slightly in size in the interval since prior PET-CT.  No new or progressive findings on today's study.   04/10/2019 - 05/20/2020 Chemotherapy   04/10/2019, patient started on durvalumab  Q 2 weeks maintenance.    06/14/2020  Progression   06/14/2020 CT chest w contrast showed vidence of progressive lymphangitic spread of tumor throughout the right lung, with contralateral lung nodules with  right pleural effusion  07/01/22 MRI brain is negative for CNS involvement.  CT abdomen with contrast showed no evidence of abdominal metastatic disease PET scan was not approved by insurance-peer to peer appeal with Dr.Vipul Bhanderi    07/08/2020 Cancer Staging   Staging form: Lung, AJCC 8th Edition - Clinical stage from 07/08/2020: Stage IVA (rcT4, cM1a) - Signed by Timmy Forbes, MD on 07/08/2020   07/16/2020 - 09/28/2021 Chemotherapy   Carboplatin  + Paclitaxel  + Pembrolizumab  q21d x 4 cycles      10/15/2020 Imaging   10/15/2020 CT chest abdomen pelvis redemonstrated postoperative and postradiation appearance of the right chest with perihilar consolidation and fibrosis.  Slight interval decrease in size of the pulmonary nodules associated with interlobular septal thickening nearly complete resolution of previously seen left-sided  pulmonary nodules.  Minimal irregular residual.  Right pleural effusion improved.  Consistent with treatment response.  No evidence of metastatic disease with the abdomen or pelvis.  None obstructive bilateral nephrolithiasis  - partial response to the treatment-  12/15/2020, CT chest without contrast showed resolution of lung nodules, no evidence of recurrent disease   10/19/2020 -  Chemotherapy   Keytruda  monotherapy maintenance   12/30/2020 [facility] Admission   patient was admitted due to unprovoked extensive deep vein thrombosis from calf vein to the common femoral vein on the left.  Patient was started on heparin  drip.  Patient underwent thrombolysis and is a colectomy on 12/31/2020.  Anticoagulation was switched to Eliquis  at discharge   04/22/2021 Imaging   CT chest with contrast showed none occlusive pulmonary embolism, Interval progression of consolidation airspace opacity in the medial right middle  lobe with bandlike opacity and bronchiectasis in the perihilar right lung,-this is compatible with evolving posttreatment changes although recurrent disease anteriorly is not excluded.Interval development of numerous bilateral pulmonary nodules, measuring up to 11 mm in the right lung base.  Concerning for metastatic disease.  Tiny right pleural effusion.    04/26/2021 -  Chemotherapy   resumed on carboplatin /Taxol /Keytruda . Patient did not tolerate his chemotherapy of carboplatin /Taxol /Keytruda  on 04/26/2021. Patient has developed severe anemia status post PRBC transfusion. 05/25/2021, carboplatin  AUC 5 with Keytruda  06/23/2021  continued on carboplatin  AUC 5 with Keytruda  07/07/2021 carboplatin  with keytruda       07/15/2021 Progression   07/15/2021 CT chest with contrast showed enlarged right lower lobe thick-walled cavitary lesion in the anterior aspect of the right lower lobe.  Concerning for progression.  Multiple other small pulmonary nodules are also noted elsewhere in the lungs, many of which are stable compared to prior studies.  Several are new or clearly enlarged.  The best example is an enlarging pulmonary nodule in the left upper lobe, 8 x 6 mm comparing to 4 x 3 mm on 04/22/2021   08/03/2021 - 02/14/2022 Chemotherapy   LUNG Docetaxel  + Ramucirumab  q21d      11/16/2021 Imaging   1. The previously noted thick-walled cavity of concern in the right lower lobe now appears more simple and thin-walled in appearance, presumably a post infectious cavity. There is a new adjacent thick-walled cavity in the right lower lobe which is aggressive in appearance, but given the evolution of the previous lesion, this may also be of infectious etiology. 2. However, there is an enlarging mixed cystic and solid nodule in the left upper lobe which is concerning for metastatic lesion or new primary lesion. This currently measures 11 x 5 x 10 mm. Close attention on follow-up studies is recommended. 3. No definite  signs of metastatic disease to the abdomen or pelvis. 4. Extensive colonic diverticulosis most involved in the sigmoid colon where there appear to be several small diverticular abscesses. one of these appears to fistulized into the wall of the urinary bladder, without frank fistulization to the lumen of the urinary bladder at this time.    02/01/2022 - 02/05/2022 Hospital Admission   Patient was admitted for HCAP, sepsis and COPD exacerbation.  Patient was treated with IV vancomycin , cefepime , prednisone , added doxycycline  to cover atypicals given CT findings.  Patient was discharged with Augmentin  and doxycycline  and completed 7 days of course.   02/01/2022 Imaging   CT angiogram chest with and without contrast 1. Bilateral chronic pulmonary embolus but no acute pulmonary embolus. 2. Probable pulmonary arterial hypertension. 3.  Aortic Atherosclerosis (  ICD10-I70.0).  Coronary atherosclerosis. 4. Improved airspace opacity along the peripheral cavitary process in the right lower lobe, currently the thin walled air cyst or cavity measures about 1.4 cm in diameter. 5. Increased peripheral tree-in-bud reticulonodular opacities in both lower lobes in the lingula, compatible with atypical infection. 6. Airway thickening and airway plugging particularly in the right lower lobe. 7. Mosaic attenuation in the lungs, stable but nonspecific.8.  Emphysema (ICD10-J43.9). 9. Stable radiation therapy related findings in the right perihilar regions. Right upper lobectomy.   02/10/2022 - 02/12/2022 Hospital Admission   Patient was admitted due to acute hypoxic respiratory failure CT findings highly suspicious for MAC Patient had AFB sputum x 3 and has follow-up appointment with ID to review results.  Patient was discharged on 2 L of nasal cannula oxygen   02/10/2022 Imaging   CT chest without contrast 1. Cavitary lesions of the anterior right lower lobe demonstrate increased wall thickening and there is a new irregular  solid pulmonary nodule of the left upper lobe. 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 2. Stable right paramediastinal postradiation change. 3. Similar bilateral mosaic attenuation, findings can be seen in setting of small airways disease or pulmonary hypertension.4. Aortic Atherosclerosis (ICD10-I70.0) and Emphysema (ICD10-J43.9).   07/17/2022 Imaging   CT chest abdomen pelvis w contrast 1. Unchanged post treatment appearance of the right chest, with dense, perihilar fibrotic bronchiectasis, consolidation, and architectural distortion. 2. Unchanged multilobulated cystic lesion of the anterior right lower lobe, with some adjacent irregular consolidation. Findings are most consistent with sequelae of prior infection. 3. Irregular nodules previously described in the left upper lobe are almost completely resolved, consistent with resolving infection or inflammation. 4. Background of very fine centrilobular and ground-glass nodularity, most concentrated in the lung apices, most commonly seen in smoking-related respiratory bronchiolitis. 5. No evidence of metastatic disease in the abdomen or pelvis.Aortic Atherosclerosis    10/20/2022 Imaging   CT chest wo contrast  1. Stable right perihilar postradiation change with no evidence of recurrent or metastatic disease. 2. Redemonstration of multiloculated cystic lesion of the anterior right lower lobe. Adjacent irregular consolidation is slightly decreased in size. Findings are likely sequela of prior infection. 3. Numerous small centrilobular pulmonary nodules which are most numerous in the upper lungs, similar to prior likely due to smoking-related respiratory bronchiolitis. 4. Aortic Atherosclerosis   08/27/2023 Imaging   CT chest abdomen pelvis w contrast showed 1. Status post right upper  lobectomy. 2. An area of cystic bronchiectasis in the right lung base demonstrates nodularity within, felt to be relatively similar to 06/14/2023. This could represent superimposed infection, including fungal ball. Metastatic disease is felt less likely, especially given alteration of shaped/position today. Consider chest CT follow-up at 3 months. 3. Otherwise, no evidence of recurrent or metastatic disease. 4. Findings highly suspicious for colovesical fistula and superimposed cystitis. Likely secondary to interval episode of diverticulitis. 5. Aortic atherosclerosis (ICD10-I70.0), coronary artery atherosclerosis and emphysema (ICD10-J43.9). 6. Trace right pleural fluid 7. Persistent filling defect along the distal Port-A-Cath, which could represent adherent thrombus.    03/06/2022, sputum collected shows Pseudomonas  aeruginosa which explains his findings on the CT scan.  There were 2 types of pseudomonas colonies and they both are resistant o ciprofloxacin. patient was referred to infectious disease and was seen by Dr. Fawn Hooks on 03/09/2022.  Patient was treated with meropenem  and completed his course on  04/04/2022.    07/04/2024- 07/08/2024 He has had pneumonia due to RSV.  INTERVAL HISTORY Samuel Choi is a 59 y.o. male who has above history reviewed by me today presents for follow up visit for metastatic squamous cell carcinoma of lung.  09/10/2023 - 09/12/2023  patient was admitted due to acute ischemic stroke.  He was supposed to take prophylactic anticoagulation with Eliquis  2.5 mg twice daily, patient self discontinued and was recommended to start Xarelto  10 mg daily.  Per admission note, patient was not taking Xarelto . Patient was evaluated by neurology.  He is left hemiparesis and weakness resolved.  Patient was started back on Eliquis  5 mg twice daily.  Patient also was treated for UTI.  April 2025, patient presented to primary care provider with symptoms of dysuria.  Patient  was diagnosed with UTI and treated with Cefdinir.   Patient reports symptoms improved with antibiotics and again he experienced dysuria symptoms.  No fever or chills.  No flank pain.  He has lost over 40 pounds since December 2025.  Decreased appetite.  REVIEW OF SYSTEMS:  Review of Systems  Constitutional:  Positive for fatigue and unexpected weight change. Negative for appetite change, chills and fever.  HENT:   Negative for hearing loss and voice change.   Eyes:  Negative for eye problems and icterus.  Respiratory:  Positive for shortness of breath. Negative for chest tightness and cough.   Cardiovascular:  Negative for chest pain and leg swelling.  Gastrointestinal:  Negative for abdominal distention, abdominal pain and blood in stool.  Endocrine: Negative for hot flashes.  Genitourinary:  Positive for dysuria and frequency. Negative for difficulty urinating.   Musculoskeletal:  Negative for arthralgias.  Skin:  Negative for itching and rash.  Neurological:  Negative for extremity weakness, light-headedness and numbness.  Hematological:  Negative for adenopathy. Does not bruise/bleed easily.  Psychiatric/Behavioral:  Negative for confusion.       VITALS:  Blood pressure 123/76, pulse 91, temperature (!) 96.3 F (35.7 C), temperature source Tympanic, resp. rate 18, weight 126 lb 12.8 oz (57.5 kg), SpO2 100%.  Wt Readings from Last 3 Encounters:  12/07/23 126 lb 12.8 oz (57.5 kg)  09/12/23 115 lb 8.3 oz (52.4 kg)  09/03/23 123 lb 11.2 oz (56.1 kg)    Body mass index is 16.73 kg/m.  Performance status (ECOG): 1 - Symptomatic but completely ambulatory  PHYSICAL EXAM:  Physical Exam HENT:     Head: Normocephalic and atraumatic.  Eyes:     General: No scleral icterus. Cardiovascular:     Rate and Rhythm: Normal rate.  Pulmonary:     Effort: Pulmonary effort is normal.     Breath sounds: No wheezing or rales.     Comments: Decreased breath sound bilaterally  Abdominal:      General: There is no distension.     Palpations: Abdomen is soft.     Tenderness: There is no abdominal tenderness.  Musculoskeletal:        General: No swelling.     Cervical back: Normal range of motion.  Skin:    General: Skin is warm and dry.  Neurological:     General: No focal deficit present.     Mental Status: He is alert. Mental status is at baseline.  Psychiatric:        Mood and Affect: Mood normal.     LABS:      Latest Ref Rng & Units 09/12/2023    4:36 AM 09/11/2023  4:06 AM 09/10/2023   12:06 PM  CBC  WBC 4.0 - 10.5 K/uL 12.5  13.1  15.6   Hemoglobin 13.0 - 17.0 g/dL 65.7  84.6  96.2   Hematocrit 39.0 - 52.0 % 33.9  33.0  35.4   Platelets 150 - 400 K/uL 224  190  198       Latest Ref Rng & Units 12/07/2023    9:19 AM 09/12/2023    4:36 AM 09/11/2023    4:06 AM  CMP  Glucose 70 - 99 mg/dL 952  97  841   BUN 6 - 20 mg/dL 7  11  12    Creatinine 0.61 - 1.24 mg/dL 3.24  4.01  0.27   Sodium 135 - 145 mmol/L 135  130  130   Potassium 3.5 - 5.1 mmol/L 2.9  3.6  4.1   Chloride 98 - 111 mmol/L 101  98  100   CO2 22 - 32 mmol/L 24  23  23    Calcium  8.9 - 10.3 mg/dL 8.1  8.0  8.6      HISTORY:   Past Medical History:  Diagnosis Date   Alcohol abuse    usually drinks 2-3 drinks per day   Atherosclerosis 06/2018   Cancer associated pain    Chronic sinusitis    Dehydration 02/07/2019   Emphysema of lung (HCC) 06/2018   patient unaware of this.   Hip fracture (HCC) 05/2018   no surgery   History of kidney stones 05/2018   per xray, bilateral nephrolitiasis   Hypertension    Squamous cell carcinoma of lung, right (HCC) 06/2018    Past Surgical History:  Procedure Laterality Date   BRAIN SURGERY  10/2017   nasal/sinus endoscopy. mass benign   ELECTROMAGNETIC NAVIGATION BROCHOSCOPY Right 06/25/2018   Procedure: ELECTROMAGNETIC NAVIGATION BRONCHOSCOPY;  Surgeon: Cleve Dale, MD;  Location: [facility] ORS;  Service: Cardiopulmonary;  Laterality: Right;    ENDOBRONCHIAL ULTRASOUND Right 06/25/2018   Procedure: ENDOBRONCHIAL ULTRASOUND;  Surgeon: Cleve Dale, MD;  Location: [facility];  Service: Cardiopulmonary;  Laterality: Right;   ENDOBRONCHIAL ULTRASOUND Right 12/26/2018   Procedure: ENDOBRONCHIAL ULTRASOUND RIGHT;  Surgeon: Cleve Dale, MD;  Location: [facility];  Service: Cardiopulmonary;  Laterality: Right;   FLEXIBLE BRONCHOSCOPY N/A 08/20/2018   Procedure: FLEXIBLE BRONCHOSCOPY PREOP;  Surgeon: Petra Brandy, MD;  Location: [facility] OR[facility];  Service: Thoracic;  Laterality: N/A;   IR CV LINE INJECTION  04/04/2019   NASAL SINUS SURGERY  10/2017   At [facility], frontal sinusotomy, ethmoidectomy, resection anterior cranial fossa neoplasm, turbinate resection   PERIPHERAL VASCULAR THROMBECTOMY Left 12/31/2020   Procedure: PERIPHERAL VASCULAR THROMBECTOMY;  Surgeon: Celso College, MD;  Location: [facility]AS[facility];  Service: Cardiovascular;  Laterality: Left;   PORTACATH PLACEMENT Left 01/15/2019   Procedure: INSERTION PORT-A-CATH;  Surgeon: Petra Brandy, MD;  Location: [facility];  Service: General;  Laterality: Left;   THORACOTOMY Right 08/13/2018   Procedure: PREOP BROCHOSCOPY WITH RIGHT THORACOTOMY AND RUL RESECTION;  Surgeon: Petra Brandy, MD;  Location: [facility];  Service: General;  Laterality: Right;   THORACOTOMY Right 08/20/2018   Procedure: THORACOTOMY MAJOR RIGHT UPPER LOBE LOBECTOMY;  Surgeon: Petra Brandy, MD;  Location: [facility];  Service: Thoracic;  Laterality: Right;   TOE SURGERY Left    pin in left toe    Family History  Problem Relation Age of Onset   Breast cancer Mother    Diabetes Mother    Lung cancer Father    Hypertension Father  Social History:  reports that he quit smoking about 5 years ago. His smoking use included cigarettes. He started smoking about 20 years ago. He has a 7.5 pack-year smoking history. He has never used smokeless tobacco. He reports current alcohol use of about 3.0 standard drinks of alcohol per week. He  reports that he does not use drugs.  Allergies: No Known Allergies  Current Medications: Current Outpatient Medications  Medication Sig Dispense Refill   albuterol  (PROVENTIL ) (2.5 MG/3ML) 0.083% nebulizer solution Take 3 mLs (2.5 mg total) by nebulization every 6 (six) hours as needed for wheezing or shortness of breath. 120 mL 0   albuterol  (VENTOLIN  HFA) 108 (90 Base) MCG/ACT inhaler INHALE 2 PUFFS BY MOUTH EVERY 6 HOURS AS NEEDED FOR WHEEZING OR SHORTNESS OF BREATH 34 g 0   apixaban  (ELIQUIS ) 5 MG TABS tablet Take 1 tablet (5 mg total) by mouth 2 (two) times daily. 60 tablet 3   benzonatate  (TESSALON ) 200 MG capsule Take 1 capsule (200 mg total) by mouth 3 (three) times daily as needed for cough. 20 capsule 0   Budeson-Glycopyrrol-Formoterol  (BREZTRI  AEROSPHERE) 160-9-4.8 MCG/ACT AERO Inhale 2 puffs into the lungs in the morning and at bedtime. 10.7 g 0   ciprofloxacin (CIPRO) 250 MG tablet Take 1 tablet (250 mg total) by mouth 2 (two) times daily. 20 tablet 0   cloNIDine  (CATAPRES ) 0.1 MG tablet Take 1 tablet (0.1 mg total) by mouth 3 (three) times daily.     cyanocobalamin  (VITAMIN B12) 1000 MCG tablet Take 1 tablet (1,000 mcg total) by mouth daily. 90 tablet 1   diltiazem  (CARDIZEM  CD) 120 MG 24 hr capsule Take 120 mg by mouth daily.     feeding supplement (ENSURE ENLIVE / ENSURE PLUS) LIQD Take 237 mLs by mouth 3 (three) times daily between meals. 237 mL 12   furosemide  (LASIX ) 40 MG tablet Take 1 tablet (40 mg total) by mouth daily as needed for fluid or edema. 30 tablet 0   latanoprost  (XALATAN ) 0.005 % ophthalmic solution Place 1 drop into both eyes at bedtime.     loratadine  (CLARITIN ) 10 MG tablet TAKE 1 TABLET(10 MG) BY MOUTH DAILY AS NEEDED FOR ALLERGIES 90 tablet 1   magnesium  chloride (SLOW-MAG) 64 MG TBEC SR tablet Take 1 tablet (64 mg total) by mouth 2 (two) times daily. 180 tablet 1   metoprolol  succinate (TOPROL -XL) 25 MG 24 hr tablet Take 1 tablet (25 mg total) by mouth  daily. 30 tablet 2   Multiple Vitamin (MULTIVITAMIN WITH MINERALS) TABS tablet Take 1 tablet by mouth daily.     pantoprazole  (PROTONIX ) 20 MG tablet TAKE 1 TABLET BY MOUTH DAILY 100 tablet 2   potassium chloride  SA (KLOR-CON  M) 20 MEQ tablet Take 1 tablet (20 mEq total) by mouth daily. 3 tablet 0   potassium chloride  SA (KLOR-CON  M) 20 MEQ tablet Take 1 tablet (20 mEq total) by mouth 2 (two) times daily. 30 tablet 0   sulfamethoxazole -trimethoprim  (BACTRIM  DS) 800-160 MG tablet Take 1 tablet by mouth 2 (two) times daily. (Patient not taking: Reported on 12/07/2023) 10 tablet 0   No current facility-administered medications for this visit.   Facility-Administered Medications Ordered in Other Visits  Medication Dose Route Frequency Provider Last Rate Last Admin   0.9 %  sodium chloride  infusion   Intravenous Continuous Borders, Carlene Che, NP   Stopped at 12/07/23 1310   dexamethasone  (DECADRON ) 10 MG/ML injection            heparin   lock flush 100 unit/mL  500 Units Intravenous Once Timmy Forbes, MD       heparin  lock flush 100 unit/mL  500 Units Intravenous Once Allyah Heather, MD       sodium chloride  flush (NS) 0.9 % injection 10 mL  10 mL Intravenous PRN Timmy Forbes, MD   10 mL at 04/04/19 0903   sodium chloride  flush (NS) 0.9 % injection 10 mL  10 mL Intravenous PRN Timmy Forbes, MD   10 mL at 07/26/20 1308   sodium chloride  flush (NS) 0.9 % injection 10 mL  10 mL Intravenous PRN Timmy Forbes, MD   10 mL at 07/28/21 0828   sodium chloride  flush (NS) 0.9 % injection 10 mL  10 mL Intravenous PRN Timmy Forbes, MD   10 mL at 12/21/22 1308       IMAGE STUDIES:  CT Chest Wo Contrast Result Date: 12/07/2023 CLINICAL DATA:  Right squamous cell lung cancer. Restaging. * Tracking Code: BO * EXAM: CT CHEST WITHOUT CONTRAST TECHNIQUE: Multidetector CT imaging of the chest was performed following the standard protocol without IV contrast. RADIATION DOSE REDUCTION: This exam was performed according to the departmental  dose-optimization program which includes automated exposure control, adjustment of the mA and/or kV according to patient size and/or use of iterative reconstruction technique. COMPARISON:  08/27/2023 FINDINGS: Cardiovascular: Heart size within normal limits. Trace pericardial effusion is similar to prior. Moderate atherosclerotic calcification is noted in the wall of the thoracic aorta. Left-sided Port-A-Cath tip is positioned in the distal SVC. Calcification around the tip of the Port-A-Cath is compatible with chronic fibrin sheath/thrombus, as noted on the prior study. Mediastinum/Nodes: No mediastinal lymphadenopathy. No evidence for gross hilar lymphadenopathy although assessment is limited by the lack of intravenous contrast on the current study. The esophagus has normal imaging features. There is no axillary lymphadenopathy. Lungs/Pleura: Centrilobular and paraseptal emphysema evident. Post treatment scarring in the parahilar right lung with volume loss in the right hemithorax is stable in the interval. Apparent cystic bronchiectasis anterior right lung is similar to prior. Lateral nodular component measured previously at 9 mm is 9 mm again today on image 80/3. Nodular component along the inferior margin of this lesion measured previously at 16 x 11 mm is 15 x 12 mm today on 88/3. There is new clustered tree-in-bud nodularity in the anterior right lung base (see image 90/3). No new suspicious pulmonary nodule or mass. Tiny chronic right pleural effusion again noted. Upper Abdomen: Visualized portion of the upper abdomen shows no acute findings. Musculoskeletal: No worrisome lytic or sclerotic osseous abnormality. IMPRESSION: 1. Stable post treatment scarring in the parahilar right lung with volume loss in the right hemithorax. 2. Stable cystic bronchiectasis in the anterior right lung with stable nodular components. Continued close attention on follow-up recommended. 3. New clustered tree-in-bud nodularity in  the anterior right lung base, likely infectious/inflammatory. Atypical etiology would be a distinct consideration. 4. Tiny chronic right pleural effusion. 5. Aortic Atherosclerosis (ICD10-I70.0) and Emphysema (ICD10-J43.9). Electronically Signed   By: Donnal Fusi M.D.   On: 12/07/2023 09:51   MR BRAIN WO CONTRAST Result Date: 09/11/2023 CLINICAL DATA:  Stroke follow-up EXAM: MRI HEAD WITHOUT CONTRAST TECHNIQUE: Multiplanar, multiecho pulse sequences of the brain and surrounding structures were obtained without intravenous contrast. COMPARISON:  07/01/2020 FINDINGS: Brain: There is a small area of abnormal diffusion restriction along the anterior aspect of the right precentral gyrus. No acute or chronic hemorrhage. There is multifocal hyperintense T2-weighted signal within the white matter.  Generalized volume loss. Cavum septum pellucidum et vergae. Vascular: Normal flow voids. Skull and upper cervical spine: Normal calvarium and skull base. Visualized upper cervical spine and soft tissues are normal. Sinuses/Orbits:No paranasal sinus fluid levels or advanced mucosal thickening. No mastoid or middle ear effusion. Normal orbits. Perforation (possibly postsurgical) of the nasal septum. IMPRESSION: 1. 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 No hemorrhage or mass effect. Electronically Signed   By: Juanetta Nordmann M.D.   On: 09/11/2023 16:55   ECHOCARDIOGRAM COMPLETE Result Date: 09/11/2023    ECHOCARDIOGRAM REPORT   Patient Name:   Samuel Choi Date of Exam: 09/11/2023 Medical Rec #:  [id number]      Height:       73.0 in Accession #:    [id number]     Weight:       119.7 lb Date of Birth:  May 05, 1966       BSA:          1.730 m Patient Age:    58 years       BP:           117/87 mmHg Patient Gender: M              HR:           100 bpm. Exam Location:  [facility] Procedure: 2D Echo, Cardiac Doppler and Color Doppler (Both Spectral and Color            Flow Doppler were utilized during  procedure). Indications:     Stroke  History:         Patient has prior history of Echocardiogram examinations, most                  recent 06/30/2023. CHF, Stroke, Pulmonary HTN and COPD,                  Arrythmias:Tachycardia; Risk Factors:Hypertension and Former                  Smoker. Prot-A-Cath in place, Lung CA, Chemo, ETOH use.  Sonographer:     Clarke Crouch Referring Phys:  [id number] ASHISH ARORA Diagnosing Phys: Antonette Batters MD  Sonographer Comments: Technically difficult study due to poor echo windows. Image acquisition challenging due to patient body habitus. IMPRESSIONS  1. Left ventricular ejection fraction, by estimation, is >75%. The left ventricle has hyperdynamic function. The left ventricle has no regional wall motion abnormalities. Left ventricular diastolic parameters are consistent with Grade I diastolic dysfunction (impaired relaxation).  2. Right ventricular systolic function is moderately reduced. The right ventricular size is severely enlarged. Moderately increased right ventricular wall thickness. There is severely elevated pulmonary artery systolic pressure.  3. Right atrial size was mildly dilated.  4. The mitral valve is grossly normal. Mild mitral valve regurgitation.  5. 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  6. The aortic valve is normal in structure. Aortic valve regurgitation is not visualized. FINDINGS  Left Ventricle: Left ventricular ejection fraction, by estimation, is >75%. The left ventricle has hyperdynamic function. The left ventricle has no regional wall motion abnormalities. Strain was performed and the global longitudinal strain is indeterminate. Global longitudinal strain performed but not reported based on interpreter judgement due to suboptimal tracking. The left ventricular internal cavity size was normal in size. There is no left ventricular hypertrophy. Left ventricular diastolic parameters are consistent  with Grade I diastolic dysfunction (impaired relaxation). Right  Ventricle: The right ventricular size is severely enlarged. Moderately increased right ventricular wall thickness. Right ventricular systolic function is moderately reduced. There is severely elevated pulmonary artery systolic pressure. The tricuspid regurgitant velocity is 4.76 m/s, and with an assumed right atrial pressure of 15 mmHg, the estimated right ventricular systolic pressure is 105.6 mmHg. Left Atrium: Left atrial size was normal in size. Right Atrium: Right atrial size was mildly dilated. Pericardium: There is no evidence of pericardial effusion. Mitral Valve: The mitral valve is grossly normal. Mild mitral valve regurgitation. MV peak gradient, 4.1 mmHg. The mean mitral valve gradient is 2.0 mmHg. 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 Aortic Valve: The aortic valve is normal in structure. Aortic valve regurgitation is not visualized. Aortic valve mean gradient measures 3.0 mmHg. Aortic valve peak gradient measures 7.1 mmHg. Aortic valve area, by VTI measures 3.49 cm. Pulmonic Valve: The pulmonic valve was grossly normal. Pulmonic valve regurgitation is trivial. Aorta: The ascending aorta was not well visualized. IAS/Shunts: No atrial level shunt detected by color flow Doppler. Additional Comments: 3D was performed not requiring image post processing on an independent workstation and was indeterminate. A venous catheter is visualized.  LEFT VENTRICLE PLAX 2D LVIDd:         4.00 cm     Diastology LVIDs:         2.00 cm     LV e' medial:    5.66 cm/s LV PW:         1.00 cm     LV E/e' medial:  11.4 LV IVS:        1.00 cm     LV e' lateral:   9.68 cm/s LVOT diam:     2.00 cm     LV E/e' lateral: 6.7 LV SV:         59 LV SV Index:   34 LVOT Area:     3.14 cm  LV Volumes (MOD) LV vol d, MOD A2C: 28.3 ml LV vol d, MOD A4C: 20.6 ml LV vol s, MOD A2C: 9.6 ml LV vol s, MOD A4C: 9.2  ml LV SV MOD A2C:     18.7 ml LV SV MOD A4C:     20.6 ml LV SV MOD BP:      14.1 ml RIGHT VENTRICLE RV Basal diam:  4.45 cm RV Mid diam:    5.10 cm RV S prime:     13.70 cm/s TAPSE (M-mode): 2.6 cm LEFT ATRIUM             Index        RIGHT ATRIUM           Index LA diam:        2.60 cm 1.50 cm/m   RA Area:     19.30 cm LA Vol (A2C):   29.0 ml 16.76 ml/m  RA Volume:   57.10 ml  33.01 ml/m LA Vol (A4C):   9.8 ml  5.65 ml/m LA Biplane Vol: 16.2 ml 9.36 ml/m  AORTIC VALVE                    PULMONIC VALVE AV Area (Vmax):    2.74 cm     PV Vmax:       0.80 m/s AV Area (Vmean):   2.50 cm     PV Peak grad:  2.5 mmHg AV Area (VTI):     3.49 cm AV Vmax:  133.00 cm/s AV Vmean:          80.700 cm/s AV VTI:            0.170 m AV Peak Grad:      7.1 mmHg AV Mean Grad:      3.0 mmHg LVOT Vmax:         116.00 cm/s LVOT Vmean:        64.100 cm/s LVOT VTI:          0.189 m LVOT/AV VTI ratio: 1.11  AORTA Ao Root diam: 3.80 cm MITRAL VALVE                TRICUSPID VALVE MV Area (PHT): 5.79 cm     TR Peak grad:   90.6 mmHg MV Area VTI:   4.09 cm     TR Vmax:        476.00 cm/s MV Peak grad:  4.1 mmHg MV Mean grad:  2.0 mmHg     SHUNTS MV Vmax:       1.01 m/s     Systemic VTI:  0.19 m MV Vmean:      61.6 cm/s    Systemic Diam: 2.00 cm MV Decel Time: 131 msec MV E velocity: 64.40 cm/s MV A velocity: 101.00 cm/s MV E/A ratio:  0.64 Dwayne D Callwood MD Electronically signed by Antonette Batters MD Signature Date/Time: 09/11/2023/10:59:35 AM    Final    DG Chest Port 1 View Result Date: 09/10/2023 CLINICAL DATA:  Leukocytosis EXAM: PORTABLE CHEST 1 VIEW COMPARISON:  Chest radiograph 06/15/2023 FINDINGS: Port-A-Cath tip projects over the superior vena cava. Monitoring leads overlie the patient. Stable cardiac and mediastinal contours. Redemonstrated postsurgical changes right hemithorax. Redemonstrated nodular area of consolidation right mid lung. Left lung is clear. No pneumothorax. IMPRESSION: Redemonstrated nodular  area of consolidation right mid lung. This was described on prior CT in may represent superimposed infectious process including fungal ball. Patient needs short-term follow-up chest CT for re-evaluation. Electronically Signed   By: Jone Neither M.D.   On: 09/10/2023 16:10   CT ANGIO HEAD NECK W WO CM (CODE STROKE) Result Date: 09/10/2023 CLINICAL DATA:  Provided history: Neuro deficit, acute, stroke suspected. EXAM: CT ANGIOGRAPHY HEAD AND NECK WITH AND WITHOUT CONTRAST TECHNIQUE: Multidetector CT imaging of the head and neck was performed using the standard protocol during bolus administration of intravenous contrast. Multiplanar CT image reconstructions and MIPs were obtained to evaluate the vascular anatomy. Carotid stenosis measurements (when applicable) are obtained utilizing NASCET criteria, using the distal internal carotid diameter as the denominator. RADIATION DOSE REDUCTION: This exam was performed according to the departmental dose-optimization program which includes automated exposure control, adjustment of the mA and/or kV according to patient size and/or use of iterative reconstruction technique. CONTRAST:  75mL OMNIPAQUE  IOHEXOL  350 MG/ML SOLN COMPARISON:  Non-contrast head CT performed earlier today 09/10/2023. FINDINGS: CTA NECK FINDINGS Aortic arch: Common origin of the innominate and left common carotid arteries. Atherosclerotic plaque within the visualized thoracic aorta and proximal major branch vessels of the neck. No hemodynamically significant innominate or proximal subclavian artery stenosis. Right carotid system: CCA and ICA patent within the neck without hemodynamically significant stenosis (50% or greater). Atherosclerotic plaque scattered within the CCA, but the carotid bifurcation and within the proximal ICA. Partially retropharyngeal course of the cervical ICA. Left carotid system: CCA and ICA patent within the neck without hemodynamically significant stenosis (50% or greater).  Atherosclerotic plaque scattered within the CCA, about the carotid bifurcation  and within the proximal ICA. 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 The left vertebral artery is patent within the neck without stenosis or significant atherosclerotic disease. Skeleton: Cervical spondylosis.  Poor dentition. Other neck: No neck mass or cervical lymphadenopathy. Upper chest: No consolidation within the imaged lung apices. Right upper lobe pulmonary scarring. Fluid within the visualized upper thoracic esophagus. Review of the MIP images confirms the above findings CTA HEAD FINDINGS Anterior circulation: The intracranial internal carotid arteries are patent. Atherosclerotic plaque within both vessels with no more than mild stenosis. The M1 middle cerebral arteries are patent. No M2 proximal branch occlusion or high-grade proximal stenosis. The anterior cerebral arteries are patent. No intracranial aneurysm is identified. Posterior circulation: There is reconstitution of enhancement within the very distal right vertebral artery V4 segment, likely due to retrograde flow. Enhancement is present within the proximal right posterior inferior cerebellar artery (which may arise from the left vertebral artery). The intracranial left vertebral artery is patent without stenosis. The basilar artery is patent. The posterior cerebral arteries are patent. Hypoplastic P1 segments with sizable posterior communicating arteries, bilaterally. Venous sinuses: Within the limitations of contrast timing, no convincing thrombus. As described Anatomic variants: As described. Review of the MIP images confirms the above findings Impression #1 called by telephone at the time of interpretation on 09/10/2023 at 12:07 pm to provider [facility] , who verbally acknowledged these results. IMPRESSION: CTA neck: 1. 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 2. The common carotid and internal carotid arteries are  patent within the neck without hemodynamically significant stenosis. Atherosclerotic plaque bilaterally, as described. 3. Aortic Atherosclerosis (ICD10-I70.0). CTA head: 1. There is reconstitution of enhancement within the very distal intracranial right vertebral artery, likely due to retrograde flow. Enhancement is also present within the proximal right posterior inferior cerebellar artery (which may arise from the left vertebral artery). 2. Non-stenotic atherosclerotic plaque within the intracranial internal carotid arteries. Electronically Signed   By: Bascom Lily D.O.   On: 09/10/2023 12:16   CT HEAD CODE STROKE WO CONTRAST Result Date: 09/10/2023 CLINICAL DATA:  Code stroke. Provided history: Neuro deficit, acute, stroke suspected. EXAM: CT HEAD WITHOUT CONTRAST TECHNIQUE: Contiguous axial images were obtained from the base of the skull through the vertex without intravenous contrast. RADIATION DOSE REDUCTION: This exam was performed according to the departmental dose-optimization program which includes automated exposure control, adjustment of the mA and/or kV according to patient size and/or use of iterative reconstruction technique. COMPARISON:  Brain MRI 07/01/2020. FINDINGS: Brain: Generalized cerebral atrophy. Patchy and ill-defined hypoattenuation within the cerebral white matter, nonspecific but compatible with moderate chronic small vessel ischemic disease. Cavum septum pellucidum and cavum vergae (anatomic variant). There is no acute intracranial hemorrhage. No demarcated cortical infarct. No extra-axial fluid collection. No evidence of an intracranial mass. No midline shift. Vascular: No hyperdense vessel.  Atherosclerotic calcifications. Skull: No calvarial fracture or aggressive osseous lesion. Sinuses/Orbits: No mass or acute finding within the imaged orbits. Postsurgical appearance of the paranasal sinuses. Incompletely imaged mucosal thickening within the right maxillary sinus (with  associated chronic reactive osteitis). Trace mucosal thickening within the left maxillary sinus at the imaged levels (with associated chronic reactive osteitis). Mild right ethmoid sinusitis. Mild mucosal thickening within the left ethmoidectomy cavity. ASPECTS [facility] Stroke Program Early CT Score) - Ganglionic level infarction (caudate, lentiform nuclei, internal capsule, insula, M1-M3 cortex): 7 - Supraganglionic infarction (M4-M6 cortex): 3 Total score (0-10 with 10 being normal): 10 No evidence of  an acute intracranial abnormality. These results were communicated to Dr. Arora at 11:53 amon 3/3/2025by text page via the [facility] messaging system. IMPRESSION: 1.  No evidence of an acute intracranial abnormality. 2. Parenchymal atrophy and chronic small vessel ischemic disease. 3. Paranasal sinus disease at the imaged levels, as described. Electronically Signed   By: Bascom Lily D.O.   On: 09/10/2023 11:53

## 2023-12-07 NOTE — Assessment & Plan Note (Signed)
 07/16/2020-09/28/2020 carboplatin  Taxol  and Keytruda  x 1  partial response -Keytruda  maintenance.-04/22/2021, progression-resumed on carboplatin /Taxol /Keytruda  - did not tolerate due to myelosuppression.-->  Taxol  held, carboplatin /Keytruda --> CT 07/2020 progression --> 08/03/2021 started docetaxel  and ramucirumab .-->confirmed to have necrotizing pneumonia pseudomonas--> chemo discontinued.  Labs are reviewed and discussed with patient. CT scan showed no signs of cancer progression. -Stable and new right lung base nodularity, close monitor. Repeat CT chest abdomen pelvis in 3 months.

## 2023-12-07 NOTE — Assessment & Plan Note (Signed)
 Possibly due to recent CVA hospitalization, UTI. He declined nutritionist evaluation.  Recommend nutrition supplements.

## 2023-12-07 NOTE — Assessment & Plan Note (Signed)
 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 Ace Holder and surgery Dr. Charmel Cooter. At that time, patient was asymptomatic.  Was recommended for observation. Now he starts to have chronic UTI symptoms.  UA is positive. Will start patient with empiric antibiotics.  Will further discuss case with urology and surgery.

## 2023-12-07 NOTE — Assessment & Plan Note (Signed)
 Chronic hypomagnesia Recommend slow mag 1 tab, continue twice daily IV mag 2g x 1

## 2023-12-07 NOTE — Assessment & Plan Note (Signed)
Continue port flush every 6-8 weeks.  

## 2023-12-10 LAB — URINE CULTURE: Culture: >=100000 — AB

## 2023-12-11 ENCOUNTER — Ambulatory Visit: Payer: Self-pay | Admitting: General Surgery

## 2023-12-11 ENCOUNTER — Other Ambulatory Visit: Payer: Self-pay

## 2023-12-11 DIAGNOSIS — N321 Vesicointestinal fistula: Secondary | ICD-10-CM

## 2023-12-11 NOTE — H&P (Signed)
 History of Present Illness Samuel Choi is a 58 year old male who presents for evaluation of left breast calcifications following a screening mammogram.   In May 2025, she underwent a routine screening mammogram that revealed possible calcifications in both breasts. A diagnostic mammogram on Nov 22, 2023, identified an indeterminate 12 mm group of calcifications in the left inner breast, while the right breast calcifications were described as milk calcium .   A core biopsy performed on Dec 06, 2023, showed axillary mucinous calcifications in the left breast. The biopsy results were inconclusive in determining the presence of cancer, as the mucin-producing cells could not be definitively classified as benign or malignant.   Patient denies any breast palpable mass, denies nipple discharge, denies nipple retraction or skin changes.   She has no significant family history of breast cancer, although a distant cousin had breast cancer. She had her first menstrual cycle at age 43 and underwent a partial hysterectomy in 2017 due to abnormal and heavy bleeding caused by fibroids. She has not taken hormone replacement therapy post-hysterectomy. This is her first breast biopsy, and she has not breastfed. She is currently not on any medications for menopause symptoms.      PAST MEDICAL HISTORY:  Past Medical History      Past Medical History:  Diagnosis Date   Anemia     History of chicken pox     Hypertension            PAST SURGICAL HISTORY:   Past Surgical History       Past Surgical History:  Procedure Laterality Date   HYSTERECTOMY       WISDOM TEETH               MEDICATIONS:  Encounter Medications        Outpatient Encounter Medications as of 12/11/2023  Medication Sig Dispense Refill   chlorthalidone 25 MG tablet Take 25 mg by mouth once daily       ergocalciferol, vitamin D2, 1,250 mcg (50,000 unit) capsule Take 50,000 Units by mouth once a week       famotidine  (PEPCID ) 20  MG tablet Take 20 mg by mouth       meloxicam (MOBIC) 7.5 MG tablet Take 1 tablet (7.5 mg total) by mouth 2 (two) times daily 60 tablet 0   potassium chloride  (K-TAB ) 20 mEq TbER ER tablet Take 20 mEq by mouth once daily FOR LOW POTASSIUM.        No facility-administered encounter medications on file as of 12/11/2023.        ALLERGIES:   Penicillin, Penicillins, and Acticoat 7 dressing [silver ]   SOCIAL HISTORY:  Social History  Social History        Socioeconomic History   Marital status: Single  Tobacco Use   Smoking status: Never   Smokeless tobacco: Never  Substance and Sexual Activity   Alcohol use: Yes   Drug use: Never    Social Drivers of Acupuncturist Strain: Low Risk  (12/11/2023)    Overall Financial Resource Strain (CARDIA)     Difficulty of Paying Living Expenses: Not hard at all  Food Insecurity: No Food Insecurity (12/11/2023)    Hunger Vital Sign     Worried About Running Out of Food in the Last Year: Never true     Ran Out of Food in the Last Year: Never true  Transportation Needs: No Transportation Needs (12/11/2023)    PRAPARE -  Therapist, art (Medical): No     Lack of Transportation (Non-Medical): No        FAMILY HISTORY:  Family History        Family History  Problem Relation Name Age of Onset   High blood pressure (Hypertension) Mother       Thyroid  disease Mother       Diabetes type II Father       High blood pressure (Hypertension) Father       High blood pressure (Hypertension) Sister       High blood pressure (Hypertension) Sister       No Known Problems Sister       No Known Problems Sister       No Known Problems Sister       High blood pressure (Hypertension) Brother       No Known Problems Brother            GENERAL REVIEW OF SYSTEMS:    General ROS: negative for - chills, fatigue, fever, weight gain or weight loss Allergy and Immunology ROS: negative for - hives  Hematological and  Lymphatic ROS: negative for - bleeding problems or bruising, negative for palpable nodes Endocrine ROS: negative for - heat or cold intolerance, hair changes Respiratory ROS: negative for - cough, shortness of breath or wheezing Cardiovascular ROS: no chest pain or palpitations GI ROS: negative for nausea, vomiting, abdominal pain, diarrhea, constipation Musculoskeletal ROS: negative for - joint swelling or muscle pain Neurological ROS: negative for - confusion, syncope Dermatological ROS: negative for pruritus and rash   PHYSICAL EXAM:     Vitals:    12/11/23 1126  BP: (!) 132/93  Pulse: 75  .  Ht:172.7 cm (5\' 8" ) Wt:(!) 104.3 kg (230 lb) WUJ:WJXB surface area is 2.24 meters squared. Body mass index is 34.97 kg/m.Aaron Aas   GENERAL: Alert, active, oriented x3   HEENT: Pupils equal reactive to light. Extraocular movements are intact. Sclera clear. Palpebral conjunctiva normal red color.Pharynx clear.   NECK: Supple with no palpable mass and no adenopathy.   LUNGS: Sound clear with no rales rhonchi or wheezes.   HEART: Regular rhythm S1 and S2 without murmur.   BREAST: Both breast examined in the sitting and supine position.  No palpable masses, skin changes, nipple retraction or nipple discharge.   EXTREMITIES: Well-developed well-nourished symmetrical with no dependent edema.   NEUROLOGICAL: Awake alert oriented, facial expression symmetrical, moving all extremities.   Results RADIOLOGY Screening mammogram: Possible calcification of both breasts (11/2023) Diagnostic mammogram: Indeterminate 12 mm group of calcification in the left inner breast; benign milk calcium  in the right breast without mammographic evidence of malignancy (11/22/2023)   PATHOLOGY Core biopsy: Axillary mucinous calcifications in the left breast; unable to determine malignancy (12/06/2023)    Assessment & Plan Indeterminate calcification in left breast   A 12 mm group of indeterminate calcifications in the  left inner breast was identified on diagnostic mammogram. Core biopsy showed mucin-producing cells, but malignancy could not be excluded. The differential diagnosis includes benign calcifications versus malignancy. Two management options are available: surgical excision to definitively rule out cancer or surveillance with a repeat mammogram in six months. Surgical excision, a more invasive procedure involving anesthesia and wire-guided removal, is a same-day procedure with a recovery period of one to two weeks. Most patients manage post-operative pain with over-the-counter medications. Surveillance allows for monitoring changes that could indicate malignancy. The decision is not urgent and can  be made after family discussion. If surgery is chosen, coordinate preoperative wire localization with radiology and provide post-operative care instructions, including pain management and wound care.   Benign calcification in right breast   Calcifications in the right breast are benign milk calcium  with no mammographic evidence of malignancy. No further intervention is required at this time.    Breast calcifications [R92.1]           Patient verbalized understanding, all questions were answered, and were agreeable with the plan outlined above.    Eldred Grego, MD   Electronically signed by Eldred Grego, MD

## 2023-12-12 ENCOUNTER — Other Ambulatory Visit: Payer: Self-pay

## 2023-12-13 ENCOUNTER — Other Ambulatory Visit: Payer: Self-pay | Admitting: Pulmonary Disease

## 2023-12-13 ENCOUNTER — Ambulatory Visit: Payer: Self-pay | Admitting: General Surgery

## 2023-12-14 ENCOUNTER — Encounter: Payer: Self-pay | Admitting: General Surgery

## 2023-12-14 ENCOUNTER — Inpatient Hospital Stay

## 2023-12-14 ENCOUNTER — Inpatient Hospital Stay: Admission: RE | Admit: 2023-12-14 | Source: Ambulatory Visit

## 2023-12-14 ENCOUNTER — Encounter: Payer: Self-pay | Admitting: Urgent Care

## 2023-12-14 ENCOUNTER — Other Ambulatory Visit: Payer: Self-pay

## 2023-12-14 ENCOUNTER — Inpatient Hospital Stay: Attending: Oncology

## 2023-12-14 ENCOUNTER — Encounter
Admission: RE | Admit: 2023-12-14 | Discharge: 2023-12-14 | Disposition: A | Discharge status: Home / Self Care | Source: Ambulatory Visit | Attending: General Surgery | Admitting: General Surgery

## 2023-12-14 ENCOUNTER — Other Ambulatory Visit: Payer: Self-pay | Admitting: Oncology

## 2023-12-14 ENCOUNTER — Ambulatory Visit: Payer: Self-pay | Admitting: General Surgery

## 2023-12-14 VITALS — BP 106/66 | HR 71 | Temp 98.2°F | Resp 18

## 2023-12-14 VITALS — BP 106/68 | HR 72 | Temp 98.2°F | Resp 18 | Ht 73.0 in | Wt 126.8 lb

## 2023-12-14 DIAGNOSIS — J441 Chronic obstructive pulmonary disease with (acute) exacerbation: Secondary | ICD-10-CM | POA: Insufficient documentation

## 2023-12-14 DIAGNOSIS — I10 Essential (primary) hypertension: Secondary | ICD-10-CM | POA: Diagnosis not present

## 2023-12-14 DIAGNOSIS — I272 Pulmonary hypertension, unspecified: Secondary | ICD-10-CM | POA: Insufficient documentation

## 2023-12-14 DIAGNOSIS — Z01812 Encounter for preprocedural laboratory examination: Secondary | ICD-10-CM | POA: Insufficient documentation

## 2023-12-14 DIAGNOSIS — E860 Dehydration: Secondary | ICD-10-CM | POA: Diagnosis not present

## 2023-12-14 DIAGNOSIS — Z01818 Encounter for other preprocedural examination: Secondary | ICD-10-CM | POA: Diagnosis present

## 2023-12-14 DIAGNOSIS — C3491 Malignant neoplasm of unspecified part of right bronchus or lung: Secondary | ICD-10-CM | POA: Insufficient documentation

## 2023-12-14 HISTORY — DX: Unspecified systolic (congestive) heart failure: I50.20

## 2023-12-14 HISTORY — DX: Atherosclerosis of aorta: I70.0

## 2023-12-14 HISTORY — DX: Diverticulosis of intestine, part unspecified, without perforation or abscess without bleeding: K57.90

## 2023-12-14 HISTORY — DX: Other forms of dyspnea: R06.09

## 2023-12-14 HISTORY — DX: Unspecified protein-calorie malnutrition: E46

## 2023-12-14 HISTORY — DX: Atherosclerotic heart disease of native coronary artery without angina pectoris: I25.10

## 2023-12-14 HISTORY — DX: Rheumatic tricuspid insufficiency: I07.1

## 2023-12-14 HISTORY — DX: Chronic obstructive pulmonary disease, unspecified: J44.9

## 2023-12-14 HISTORY — DX: Supraventricular tachycardia, unspecified: I47.10

## 2023-12-14 HISTORY — DX: Inappropriate sinus tachycardia, so stated: I47.11

## 2023-12-14 HISTORY — DX: Fatty (change of) liver, not elsewhere classified: K76.0

## 2023-12-14 HISTORY — DX: Other cerebrovascular disease: I67.89

## 2023-12-14 HISTORY — DX: Long term (current) use of anticoagulants: Z79.01

## 2023-12-14 HISTORY — DX: Anemia, unspecified: D64.9

## 2023-12-14 LAB — CBC
HCT: 35.9 % — ABNORMAL LOW (ref 39.0–52.0)
Hemoglobin: 12.1 g/dL — ABNORMAL LOW (ref 13.0–17.0)
MCH: 33 pg (ref 26.0–34.0)
MCHC: 33.7 g/dL (ref 30.0–36.0)
MCV: 97.8 fL (ref 80.0–100.0)
Platelets: 257 10*3/uL (ref 150–400)
RBC: 3.67 MIL/uL — ABNORMAL LOW (ref 4.22–5.81)
RDW: 14 % (ref 11.5–15.5)
WBC: 10.8 10*3/uL — ABNORMAL HIGH (ref 4.0–10.5)
nRBC: 0 % (ref 0.0–0.2)

## 2023-12-14 LAB — SURGICAL PCR SCREEN
MRSA, PCR: NEGATIVE
Staphylococcus aureus: NEGATIVE

## 2023-12-14 LAB — BASIC METABOLIC PANEL - CANCER CENTER ONLY
Anion gap: 9 (ref 5–15)
BUN: 9 mg/dL (ref 6–20)
CO2: 21 mmol/L — ABNORMAL LOW (ref 22–32)
Calcium: 8.4 mg/dL — ABNORMAL LOW (ref 8.9–10.3)
Chloride: 105 mmol/L (ref 98–111)
Creatinine: 1.05 mg/dL (ref 0.61–1.24)
GFR, Estimated: >60 mL/min (ref >60)
Glucose, Bld: 102 mg/dL — ABNORMAL HIGH (ref 70–99)
Potassium: 4.6 mmol/L (ref 3.5–5.1)
Sodium: 135 mmol/L (ref 135–145)

## 2023-12-14 LAB — TYPE AND SCREEN
ABO/RH(D): A POS
Antibody Screen: NEGATIVE

## 2023-12-14 LAB — MAGNESIUM: Magnesium: 1.4 mg/dL — ABNORMAL LOW (ref 1.7–2.4)

## 2023-12-14 MED ORDER — HEPARIN SOD (PORK) LOCK FLUSH 100 UNIT/ML IV SOLN
500.0000 [IU] | Freq: Once | INTRAVENOUS | Status: AC
  Administered 2023-12-14: 500 [IU] via INTRAVENOUS
  Filled 2023-12-14: qty 5

## 2023-12-14 MED ORDER — SODIUM CHLORIDE 0.9 % IV SOLN
Freq: Once | INTRAVENOUS | Status: AC
  Filled 2023-12-14: qty 250

## 2023-12-14 MED ORDER — MAGNESIUM SULFATE 4 GM/100ML IV SOLN
4.0000 g | Freq: Once | INTRAVENOUS | Status: AC
  Administered 2023-12-14: 4 g via INTRAVENOUS
  Filled 2023-12-14: qty 100

## 2023-12-14 MED ORDER — HEPARIN SOD (PORK) LOCK FLUSH 100 UNIT/ML IV SOLN
250.0000 [IU] | Freq: Once | INTRAVENOUS | Status: DC | PRN
Start: 2023-12-14 — End: 2023-12-14
  Filled 2023-12-14: qty 5

## 2023-12-14 MED ORDER — SODIUM CHLORIDE 0.9% FLUSH
3.0000 mL | Freq: Once | INTRAVENOUS | Status: DC | PRN
Start: 2023-12-14 — End: 2023-12-14
  Filled 2023-12-14: qty 3

## 2023-12-14 MED ORDER — CIPROFLOXACIN HCL 250 MG PO TABS: 250.0000 mg | ORAL_TABLET | Freq: Two times a day (BID) | ORAL | 0 refills | Status: DC

## 2023-12-14 NOTE — Progress Notes (Addendum)
 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 Eldred Grego., MD.   Preparation for his procedure, patient presented to the PAT clinic on 12/14/2023 for preoperative interview and testing.  Patient has a cardiovascular history, and was therefore forwarded to me for preoperative anesthesia consult.  In review of patient's medical history, it is noted that he has cardiopulmonary diagnoses.  Reviewed noninvasive cardiovascular diagnostic studies, which indicated patient has a severe pulmonary hypertension diagnosis.  Most recent TTE performed on 09/11/2023 revealed a normal left ventricular systolic function with a hyperdynamic LVEF of >75%. There were no regional wall motion abnormalities.  Left ventricular diastolic Doppler parameters consistent with abnormal relaxation (G1DD).  Right ventricle was severely enlarged with moderately increased wall thickness; TAPSE measuring 2.6 cm  (normal range >/= 1.6 cm).  Right atrium mildly dilated.  Patient with a known pulmonary hypertension diagnosis; RVSP severely elevated at 105.6 mmHg.  There was mild mitral, moderate to severe tricuspid and trivial pulmonic valve regurgitation. All transvalvular gradients were noted to be normal providing no evidence suggestive of valvular stenosis. Aorta normal in size with no evidence of ectasia or aneurysmal  dilatation.  Impression and Plan:  Samuel Choi is scheduled for partial colectomy on Monday (12/17/2023) with Dr. Eldred Grego, MD. In review of his past medical history, it is noted that patient has a history of severe pulmonary hypertension.    Most recent echocardiogram revealed an elevated RVSP of 105.6 mmHg.  With that said, unfortunately given this clinical data, this patient is not deemed safe to undergo this procedure here at [facility]. Generally, these patients require cardiac anesthesia, intraoperative TEE capabilities, and continuous invasive hemodynamic monitoring.  We do not have the capabilities to provide this level of care here at our facility. I discussed case with attending anesthesiologist (Mazzoni, MD) who agrees with my assessment and advised that patient would need to be transferred to a tertiary care center capable of providing safe anesthetic care for this procedure.  Attempted to notify the office, however we had to leave a message with the surgery scheduler regarding the aforementioned.  Will send a copy of my note to patient's primary attending surgeon to notify him that case will need to be transferred to a facility capable of providing a higher level of anesthetic care. Will defer conversation regarding case cancellation and plans for care transfer to the surgeon's office, as it will have to be determined whether patient's care will be transferred to [facility], [place], or [facility].   ADDENDUM 12/16/2023 @ 1420 PM: I failed to appreciate the fact that Dr. Dustin Gimenez, MD is listed as co-surgeon for this case. Will forward a copy of my note to her in efforts to communicate that procedure will need to be transferred to a higher level of care. This will allow for surgeon to plan her day and focus her efforts either in clinic or with another patient in  the OR.   Renate Caroline, MSN, APRN, FNP-C, CEN [facility]   Perioperative Services Nurse Practitioner Phone: [phone] Fax: [phone] 12/14/23 2:16 PM  NOTE: This note has been prepared using Dragon dictation software. Despite my best ability to proofread, there is always the potential that unintentional transcriptional errors may still occur from this process.

## 2023-12-14 NOTE — Patient Instructions (Signed)
 Magnesium Sulfate Injection What is this medication? MAGNESIUM SULFATE (mag NEE zee um SUL fate) prevents and treats low levels of magnesium in your body. It may also be used to prevent and treat seizures during pregnancy in people with high blood pressure disorders, such as preeclampsia or eclampsia. Magnesium plays an important role in maintaining the health of your muscles and nervous system. This medicine may be used for other purposes; ask your health care provider or pharmacist if you have questions. What should I tell my care team before I take this medication? They need to know if you have any of these conditions: Heart disease History of irregular heart beat Kidney disease An unusual or allergic reaction to magnesium sulfate, medications, foods, dyes, or preservatives Pregnant or trying to get pregnant Breast-feeding How should I use this medication? This medication is for infusion into a vein. It is given in a hospital or clinic setting. Talk to your care team about the use of this medication in children. While this medication may be prescribed for selected conditions, precautions do apply. Overdosage: If you think you have taken too much of this medicine contact a poison control center or emergency room at once. NOTE: This medicine is only for you. Do not share this medicine with others. What if I miss a dose? This does not apply. What may interact with this medication? Certain medications for anxiety or sleep Certain medications for seizures, such phenobarbital Digoxin Medications that relax muscles for surgery Narcotic medications for pain This list may not describe all possible interactions. Give your health care provider a list of all the medicines, herbs, non-prescription drugs, or dietary supplements you use. Also tell them if you smoke, drink alcohol, or use illegal drugs. Some items may interact with your medicine. What should I watch for while using this  medication? Your condition will be monitored carefully while you are receiving this medication. You may need blood work done while you are receiving this medication. What side effects may I notice from receiving this medication? Side effects that you should report to your care team as soon as possible: Allergic reactions--skin rash, itching, hives, swelling of the face, lips, tongue, or throat High magnesium level--confusion, drowsiness, facial flushing, redness, sweating, muscle weakness, fast or irregular heartbeat, trouble breathing Low blood pressure--dizziness, feeling faint or lightheaded, blurry vision Side effects that usually do not require medical attention (report to your care team if they continue or are bothersome): Headache Nausea This list may not describe all possible side effects. Call your doctor for medical advice about side effects. You may report side effects to FDA at 1-800-FDA-1088. Where should I keep my medication? This medication is given in a hospital or clinic and will not be stored at home. NOTE: This sheet is a summary. It may not cover all possible information. If you have questions about this medicine, talk to your doctor, pharmacist, or health care provider.  2024 Elsevier/Gold Standard (2021-03-09 00:00:00)

## 2023-12-14 NOTE — Consult Note (Signed)
 WOC Nurse requested for preoperative stoma site marking  Discussed surgical procedure and possible stoma creation with patient.  Explained role of the WOC nurse team. Provided the patient with educational booklet and provided samples of pouching options.  Answered patient's questions.   Examined patient sitting, and standing in order to place the marking in the patient's visual field, away from any creases or abdominal contour issues and within the rectus muscle.  Attempted to mark below the patient's belt line, but this was not possible, since a significant crease occurs lower on the abd when the patient leans forward which should be avoided if possible.  For this reason, U had to mark higher then usual.   Marked for colostomy in the LLQ  __5__ cm to the left of the umbilicus and _5___cm above the umbilicus.  Marked for ileostomy in the RLQ  __5__cm to the right of the umbilicus and  _5___ cm above the umbilicus.  Patient's abdomen cleansed with CHG wipes at site markings, allowed to air dry prior to marking. Covered mark with thin film transparent dressing to preserve mark until date of surgery. Provided with marking pen and instructed to re-color in the mark if it begins to fade prior to surgery.   WOC Nurse team will follow up with patient after surgery for continued ostomy care and teaching if he receives an ostomy.  Thank-you,  Wiliam Harder MSN, RN, CWOCN, [place], CNS [phone]

## 2023-12-14 NOTE — H&P (Signed)
 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 He was referred by Dr. Wilhelmenia Harada for evaluation of his colovesical fistula.  He has been experiencing recurrent urinary tract infections since February 2025, managed intermittently with antibiotics. Symptoms include dysuria and a sputtering sensation at the end of urination, sometimes accompanied by a 'milky solution'. No fecaluria or pneumaturia is observed.  He experiences abdominal pain when his bladder does not empty completely. No fever is present. His appetite remains normal, and he reports regular bowel movements. He has not been hospitalized for diverticulitis and was unaware of having diverticulitis prior to this consultation.  He has a history of lung cancer, treated with radiation therapy, and is currently monitored with CT scans. He quit smoking six years ago. He experiences dyspnea with extensive walking but not with regular activities. He is on anticoagulants due to a history of deep vein thrombosis and has been on them for nine years. He had a suspected stroke in February 2025 but has recovered all functions without the need for physical therapy.  He is active around the house but avoids mowing the lawn due to dust and pollen affecting his allergies and lungs.   PAST MEDICAL HISTORY:  Past Medical History:  Diagnosis Date  Lung cancer (CMS/HHS-HCC)     PAST SURGICAL HISTORY:  Past Surgical History:  Procedure Laterality Date  COLONOSCOPY 02/20/2018  Tubular adenoma of the colon/Repeat 66yrs/MUS    MEDICATIONS:  Outpatient Encounter Medications as of 12/11/2023  Medication Sig Dispense Refill  albuterol  90 mcg/actuation inhaler Inhale 2 inhalations into the lungs every 6 (six) hours as needed  apixaban  (ELIQUIS ) 2.5 mg tablet Take 2.5 mg by mouth 2 (two) times daily  BREZTRI  AEROSPHERE 160-9-4.8 mcg/actuation inhaler Inhale 2 inhalations  into the lungs  ciprofloxacin  HCl (CIPRO ) 250 MG tablet Take 250 mg by mouth 2 (two) times daily  cloNIDine  HCL (CATAPRES ) 0.1 MG tablet Take 1 tablet (0.1 mg total) by mouth 2 (two) times daily 180 tablet 3  dilTIAZem  (CARDIZEM  CD) 120 MG XR capsule TAKE 1 CAPSULE(120 MG) BY MOUTH EVERY DAY 90 capsule 3  FLONASE  ALLERGY RELIEF 50 mcg/actuation nasal spray  folic acid  (FOLVITE ) 400 MCG tablet Take by mouth  latanoprost  (XALATAN ) 0.005 % ophthalmic solution INSTILL 1 DROP IN BOTH EYES EVERY NIGHT AT BEDTIME  loratadine  (CLARITIN ) 10 mg tablet Take 10 mg by mouth once daily as needed  magnesium  chloride (SLOW-MAG) 71.5 mg DR tablet Take 1 tablet by mouth 2 (two) times daily  methocarbamoL  (ROBAXIN ) 500 MG tablet Take 500 mg by mouth at bedtime  metoprolol  succinate (TOPROL -XL) 25 MG XL tablet Take 1 tablet (25 mg total) by mouth once daily 90 tablet 3  ondansetron  (ZOFRAN -ODT) 4 MG disintegrating tablet Take 1 tablet (4 mg total) by mouth every 8 (eight) hours as needed for Nausea for up to 12 doses 12 tablet 0  pantoprazole  (PROTONIX ) 20 MG DR tablet Take 20 mg by mouth once daily  potassium chloride  (KLOR-CON  M20) 20 MEQ ER tablet Take 20 mEq by mouth 2 (two) times daily  ALLEGRA ALLERGY 180 mg tablet (Patient not taking: Reported on 12/11/2023)  benzonatate  (TESSALON ) 200 MG capsule Take 200 mg by mouth 3 (three) times daily as needed (Patient not taking: Reported on 12/11/2023)  hydrALAZINE  (APRESOLINE ) 50 MG tablet Take 1 tablet (50 mg total) by mouth 2 (two) times daily (Patient not taking: Reported on 12/11/2023) 180 tablet  3  lidocaine -prilocaine  (EMLA ) cream Apply to affected area once (Patient not taking: Reported on 06/20/2023)  metroNIDAZOLE (FLAGYL) 500 MG tablet Take 2 tablets at 2 pm, 3 pm and 10 pm the day before surgery. 6 tablet 0  neomycin 500 mg tablet Take 2 tablets at 2 pm, 3 pm and 10 pm the day before surgery. 6 tablet 0  OXYGEN-AIR DELIVERY SYSTEMS MISC Use 2 L (Patient not  taking: Reported on 12/11/2023)  sulfamethoxazole -trimethoprim  (BACTRIM  DS) 800-160 mg tablet Take 1 tablet by mouth 2 (two) times daily (Patient not taking: Reported on 12/11/2023)   No facility-administered encounter medications on file as of 12/11/2023.    ALLERGIES:  Patient has no known allergies.  SOCIAL HISTORY:  Social History   Socioeconomic History  Marital status: Married  Spouse name: Fernado Brigante  Number of children: 1  Years of education: 12  Highest education level: High school graduate  Occupational History  Occupation: DISABLED-RETIRED  Tobacco Use  Smoking status: Former  Current packs/day: 0.50  Average packs/day: 0.5 packs/day for 16.0 years (8.0 ttl pk-yrs)  Types: Cigarettes  Smokeless tobacco: Never  Vaping Use  Vaping status: Never Used  Substance and Sexual Activity  Alcohol use: Not Currently  Alcohol/week: 6.0 standard drinks of alcohol  Types: 6 Cans of beer per week  Drug use: No  Sexual activity: Yes  Partners: Female  Birth control/protection: Condom   Social Drivers of Corporate investment banker Strain: Low Risk (12/11/2023)  Overall Financial Resource Strain (CARDIA)  Difficulty of Paying Living Expenses: Not hard at all  Food Insecurity: No Food Insecurity (12/11/2023)  Hunger Vital Sign  Worried About Running Out of Food in the Last Year: Never true  Ran Out of Food in the Last Year: Never true  Transportation Needs: No Transportation Needs (12/11/2023)  PRAPARE - Risk analyst (Medical): No  Lack of Transportation (Non-Medical): No   FAMILY HISTORY:  Family History  Problem Relation Name Age of Onset  Deep vein thrombosis (DVT or abnormal blood clot formation) Brother VERNEN    GENERAL REVIEW OF SYSTEMS:   General ROS: negative for - chills, fatigue, fever, weight gain or weight loss Allergy and Immunology ROS: negative for - hives  Hematological and Lymphatic ROS: negative for - bleeding problems or  bruising, negative for palpable nodes Endocrine ROS: negative for - heat or cold intolerance, hair changes Respiratory ROS: negative for - cough, shortness of breath or wheezing Cardiovascular ROS: no chest pain or palpitations GI ROS: negative for nausea, vomiting, abdominal pain, diarrhea, constipation Musculoskeletal ROS: negative for - joint swelling or muscle pain Neurological ROS: negative for - confusion, syncope Dermatological ROS: negative for pruritus and rash  PHYSICAL EXAM:  Vitals:  12/11/23 1100  BP: 118/81  Pulse: 86  .  Ht:185.4 cm (6\' 1" ) Wt:59.9 kg (132 lb) ZOX:WRUE surface area is 1.76 meters squared. Body mass index is 17.42 kg/m.Aaron Aas  GENERAL: Alert, active, oriented x3  HEENT: Pupils equal reactive to light. Extraocular movements are intact. Sclera clear. Palpebral conjunctiva normal red color.Pharynx clear.  NECK: Supple with no palpable mass and no adenopathy.  LUNGS: Sound clear with no rales rhonchi or wheezes.  HEART: Regular rhythm S1 and S2 without murmur.  ABDOMEN: Soft and depressible, nontender with no palpable mass, no hepatomegaly.   EXTREMITIES: Well-developed well-nourished symmetrical with no dependent edema.  NEUROLOGICAL: Awake alert oriented, facial expression symmetrical, moving all extremities.  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 Likely due to chronic diverticulitis, despite no hospitalization history. Surgery is necessary due to infection risk, especially with a cancer history. The procedure involves resection of the affected intestine and separation from the bladder, with potential complications including temporary colostomy or ileostomy if immediate reconnection is not  feasible. Recovery requires a hospital stay of at least 2-3 days to monitor for complications such as bleeding, infection, or anastomotic leak. Schedule surgery for resection of the affected intestine from the bladder. Administer bowel prep and antibiotics the day before surgery. Hold anticoagulants two days preoperatively. Insert a Foley catheter during surgery, potentially remaining for one week postoperatively. Arrange urology follow-up for Foley removal if needed. Use ureteral contrast during surgery to prevent injury.  Lung cancer  Previously treated with radiation, lung cancer is currently not undergoing treatment. The last biopsy indicated chronic infection, not active malignancy. Smoking cessation six years ago benefits overall health.  Shortness of breath on exertion  Dyspnea occurs on significant exertion, likely related to pulmonary history and allergies.  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 No history of pulmonary embolism.  Colovesical fistula [N32.1]   Patient verbalized understanding, all questions were answered, and were agreeable with the plan outlined above.   Eldred Grego, MD  Electronically signed by Eldred Grego, MD

## 2023-12-14 NOTE — Patient Instructions (Addendum)
 Your procedure is scheduled on:  MONDAY JUNE 9  Report to the Registration Desk on the 1st floor of the [facility] To find out your arrival time, please call [phone] between 1PM - 3PM on:   FRIDAY  JUNE 6  If your arrival time is 6:00 am, do not arrive before that time as the [facility] entrance doors do not open until 6:00 am.  REMEMBER: Instructions that are not followed completely may result in serious medical risk, up to and including death; or upon the discretion of your surgeon and anesthesiologist your surgery may need to be rescheduled.  Do not eat food after midnight the night before surgery.  No gum chewing or hard candies.   One week prior to surgery: Stop Anti-inflammatories (NSAIDS) such as Advil, Aleve , Ibuprofen, Motrin, Naproxen , Naprosyn  and Aspirin  based products such as Excedrin, Goody's Powder, BC Powder. Stop ANY OVER THE COUNTER supplements until after surgery. Multiple Vitamin (MULTIVITAMIN WITH MINERALS)  cyanocobalamin  (VITAMIN B12)   You may however, continue to take Tylenol  if needed for pain up until the day of surgery.  TAKE ANTIBIOTICS THE DAY BEFORE SURGERY AS PRESCRIBED  **Follow recommendations regarding stopping blood thinners.** apixaban  (ELIQUIS ) hold 2 days before surgery per Dr. Dortha Gauss, last dose FRIDAY JUNE 6   Continue taking all of your other prescription medications up until the day of surgery.  ON THE DAY OF SURGERY ONLY TAKE THESE MEDICATIONS WITH SIPS OF WATER:  metoprolol  succinate (TOPROL -XL)  pantoprazole  (PROTONIX )   Use inhalers on the day of surgery and bring to the hospital. albuterol  (PROVENTIL )  albuterol  (VENTOLIN  HFA)   Fleets enema or bowel prep as directed per Dr. Dortha Gauss instructions  No Alcohol for 24 hours before or after surgery.  No Smoking including e-cigarettes for 24 hours before surgery.  No chewable tobacco products for at least 6 hours before surgery.  No nicotine patches on the day  of surgery.  Do not use any "recreational" drugs for at least a week (preferably 2 weeks) before your surgery.  Please be advised that the combination of cocaine and anesthesia may have negative outcomes, up to and including death. If you test positive for cocaine, your surgery will be cancelled.  On the morning of surgery brush your teeth with toothpaste and water, you may rinse your mouth with mouthwash if you wish. Do not swallow any toothpaste or mouthwash.  Use CHG Soap or wipes as directed on instruction sheet.  Do not wear jewelry, make-up, hairpins, clips or nail polish.  For welded (permanent) jewelry: bracelets, anklets, waist bands, etc.  Please have this removed prior to surgery.  If it is not removed, there is a chance that hospital personnel will need to cut it off on the day of surgery.  Do not wear lotions, powders, or perfumes.   Do not shave body hair from the neck down 48 hours before surgery.  Contact lenses, hearing aids and dentures may not be worn into surgery.  Do not bring valuables to the hospital. [facility] is not responsible for any missing/lost belongings or valuables.   Notify your doctor if there is any change in your medical condition (cold, fever, infection).  Wear comfortable clothing (specific to your surgery type) to the hospital.  After surgery, you can help prevent lung complications by doing breathing exercises.  Take deep breaths and cough every 1-2 hours.  If you are being admitted to the hospital overnight, leave your suitcase in the car. After surgery  it may be brought to your room.  In case of increased patient census, it may be necessary for you, the patient, to continue your postoperative care in the Same Day Surgery department.  If you are being discharged the day of surgery, you will not be allowed to drive home. You will need a responsible individual to drive you home and stay with you for 24 hours after surgery.   If you are  taking public transportation, you will need to have a responsible individual with you.  Please call the Pre-admissions Testing Dept. at [phone] if you have any questions about these instructions.  Surgery Visitation Policy:  Patients having surgery or a procedure may have two visitors.  Children under the age of 53 must have an adult with them who is not the patient.  Inpatient Visitation:    Visiting hours are 7 a.m. to 8 p.m. Up to four visitors are allowed at one time in a patient room. The visitors may rotate out with other people during the day.  One visitor age 56 or older may stay with the patient overnight and must be in the room by 8 p.m.      Preparing for Surgery with CHLORHEXIDINE  GLUCONATE (CHG) Soap  Chlorhexidine  Gluconate (CHG) Soap  o An antiseptic cleaner that kills germs and bonds with the skin to continue killing germs even after washing  o Used for showering the night before surgery and morning of surgery  Before surgery, you can play an important role by reducing the number of germs on your skin.  CHG (Chlorhexidine  gluconate) soap is an antiseptic cleanser which kills germs and bonds with the skin to continue killing germs even after washing.  Please do not use if you have an allergy to CHG or antibacterial soaps. If your skin becomes reddened/irritated stop using the CHG.  1. Shower the NIGHT BEFORE SURGERY and the MORNING OF SURGERY with CHG soap.  2. If you choose to wash your hair, wash your hair first as usual with your normal shampoo.  3. After shampooing, rinse your hair and body thoroughly to remove the shampoo.  4. Use CHG as you would any other liquid soap. You can apply CHG directly to the skin and wash gently with a scrungie or a clean washcloth.  5. Apply the CHG soap to your body only from the neck down. Do not use on open wounds or open sores. Avoid contact with your eyes, ears, mouth, and genitals (private parts). Wash face and  genitals (private parts) with your normal soap.  6. Wash thoroughly, paying special attention to the area where your surgery will be performed.  7. Thoroughly rinse your body with warm water.  8. Do not shower/wash with your normal soap after using and rinsing off the CHG soap.  9. Pat yourself dry with a clean towel.  10. Wear clean pajamas to bed the night before surgery.  12. Place clean sheets on your bed the night of your first shower and do not sleep with pets.  13. Shower again with the CHG soap on the day of surgery prior to arriving at the hospital.  14. Do not apply any deodorants/lotions/powders.  15. Please wear clean clothes to the hospital.

## 2023-12-16 MED ORDER — ACETAMINOPHEN 500 MG PO TABS: 1000.0000 mg | ORAL_TABLET | ORAL | Status: DC

## 2023-12-16 MED ORDER — CHLORHEXIDINE GLUCONATE 0.12 % MT SOLN
15.0000 mL | Freq: Once | OROMUCOSAL | Status: DC
Start: 2023-12-16 — End: 2023-12-17

## 2023-12-16 MED ORDER — GABAPENTIN 300 MG PO CAPS: 300.0000 mg | ORAL_CAPSULE | ORAL | Status: DC

## 2023-12-16 MED ORDER — CHLORHEXIDINE GLUCONATE 0.12 % MT SOLN: 15.0000 mL | Freq: Once | OROMUCOSAL | Status: DC

## 2023-12-16 MED ORDER — ORAL CARE MOUTH RINSE
15.0000 mL | Freq: Once | OROMUCOSAL | Status: DC
Start: 2023-12-16 — End: 2023-12-17

## 2023-12-16 MED ORDER — ORAL CARE MOUTH RINSE: 15.0000 mL | Freq: Once | OROMUCOSAL | Status: DC

## 2023-12-16 MED ORDER — ALVIMOPAN 12 MG PO CAPS: 12.0000 mg | ORAL_CAPSULE | ORAL | Status: DC

## 2023-12-16 MED ORDER — CELECOXIB 200 MG PO CAPS: 200.0000 mg | ORAL_CAPSULE | ORAL | Status: DC

## 2023-12-16 MED ORDER — LACTATED RINGERS IV SOLN: INTRAVENOUS | Status: DC

## 2023-12-16 MED ORDER — BUPIVACAINE LIPOSOME 1.3 % IJ SUSP: 20.0000 mL | Freq: Once | INTRAMUSCULAR | Status: DC

## 2023-12-16 MED ORDER — SODIUM CHLORIDE 0.9 % IV SOLN: 2.0000 g | INTRAVENOUS | Status: DC

## 2023-12-17 ENCOUNTER — Encounter: Admission: RE | Payer: Self-pay | Source: Home / Self Care

## 2023-12-17 ENCOUNTER — Inpatient Hospital Stay: Admission: RE | Admit: 2023-12-17 | Source: Home / Self Care | Admitting: General Surgery

## 2023-12-17 HISTORY — DX: Pulmonary hypertension, unspecified: I27.20

## 2023-12-17 SURGERY — COLECTOMY, PARTIAL, ROBOT-ASSISTED, LAPAROSCOPIC
Anesthesia: General | Site: Abdomen

## 2023-12-20 ENCOUNTER — Telehealth: Payer: Self-pay

## 2023-12-20 ENCOUNTER — Other Ambulatory Visit: Payer: Self-pay | Admitting: Oncology

## 2023-12-20 MED ORDER — SULFAMETHOXAZOLE-TRIMETHOPRIM 800-160 MG PO TABS: 1.0000 | ORAL_TABLET | Freq: Two times a day (BID) | ORAL | 0 refills | Status: DC

## 2023-12-20 NOTE — Telephone Encounter (Signed)
 Called pt and informed him that per Dr. Wilhelmenia Harada his urine culture grew 2 different bacteria. I need to switch him to another abx.  Pt has been informed.

## 2023-12-24 NOTE — Progress Notes (Signed)
 New Patient Clinic History and Physical  ATTENDING ATTESTATION: Patient seen and examined at bedside. 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  Unfortunately- he has multiple co-morbidities which would significantly impact operative risk for colovesical fistula takedown which would be our normal recommendation in this setting.  The following issues will need addressed prior to us  discussing operative therapy: Echocardiogram demonstrates severe elevation of PASP as well as enlarged right ventricle and reduced RV ventricular systolic function.  Severe tricuspid regurg as well.  Would like evaluation by cardiology and pulmonology at [facility] regarding risk stratification and any potential for optimization History of DVT necessitating thrombectomy- would want hematology evaluation to ensure no need for filter and plan regarding anticoagulation management if surgery is pursued.  Significant malnutrition- has lost 20 lbs in last few months per patient- encouraged protein calorie dense supplements such as boost or ensure. Will ultimately need colonoscopy prior to surgery- will arrange once evaluated by cardiology and pulmonology Would recommend continued antibiotic prophylaxis for UTI.  RTC following evaluations listed above  Dorn CHRISTELLA Bock, MD Colorectal Surgery ====================================================================================================================================================================================================================================================================================================================================================  Date: 12/24/2023  Chief Complaint: Colovesical fistula History of Present Illness:   Samuel Choi is a 58 y.o. 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   He has been experiencing recurrent UTIs since February 2025 (we do not have culture data), managed intermittently with antibiotics. Symptoms include dysuria and pneumaturia, sometimes milky solution from urethra. No fecaluria or urine from the rectum. Occasional bilateral flank pain. Denies fevers, chills.   Denies abdominal pain, no changes in bowel movements. No history of pelvic radiation or surgery.   PMH significant for lung cancer, treated with radiation therapy, and is currently monitored with CT scans. Quit smoking six years ago. He experiences dyspnea with extensive walking but not with regular activities. On Eliquis  5mg  for prior DVT, suspected CVA in 08/2023. Has recovered all functions without the need for physical therapy.  Past surgical history:  - Right upper lobectomy (08/2018)  Past colonoscopy: 12/21/2017 Multiple sessile polyps, removed with cold snare. Several small mouthed divertiucula in the sigmoid, descending colon, transverse colon, ascending colon. Path showing tubular adenoma.    Imaging Findings: CT AP 08/27/23 [place] Findings highly suspicious for colovesical fistula and superimposed cystitis. Likely secondary to interval episode of diverticulitis.   Past Medical History[1]  Past Surgical History[2]   Allergies[3]  Current Medications[4]  Family History[5]  Short Social History[6]  Review of Systems:  General: No fevers, weight loss, fatigue  HEENT: No visual changes, hearing changes CV: No chest pain, palpitations Resp: No shortness of breath GI: No nausea, vomiting, diarrhea, constipation GU: No dysuria, hematuria  MSK: No muscle aches, arthritis Neuro: No numbness, tingling  Psych: No depression, anxiety  Physical Exam: BP 111/72   Pulse 72   Temp 36.4 C (97.5 F) (Tympanic)   Ht 185.4 cm (6' 1)   Wt 56.2 kg (124 lb)   BMI 16.36 kg/m   General: well appearing  but thin HEENT: atraumatic normocephalic CV:RRR Resp: equal chest rise B/l Abd: soft, non-tender Extremities: no edema  Assessment/Plan: Samuel Choi is a 58 y.o. male patient with colovesical fistula. His symptoms and imaging are consistent with colovesical fistula however his most recent colonoscopy in 2019 showed multiple polyps. I would like to re-evaluate with  colonoscopy for further evaluation of fistula and rule out colorectal cancer. However, he has multiple medical comorbidities including pulmonary hypertension, prior DVT on anticoagulation, and CVA. He is able to perform ADLs. He is not currently being followed by Cardiology for his pulmonary hypertension (from tricuspid regurgitation). He will require medical optimization prior to surgical intervention for his fistula.   - CBC, CMP - He will need repeat colonoscopy prior to surgery given history of polyps to rule out cancer and evaluate fistula however we will hold off on this at the moment given current anticoagulation. He may require pre-admission for heparin  drip and bowel prep given his multiple comorbidities.  - Cardiology consultation for severe pulmonary arterial hypertension - Pulmonology consult for pulmonary hypertension management as he is not currently on medications for pulmonary HTN - Prophylactic Bactrim . Per medication fill history he was most recently prescribed Bactrim .  - Of note he has elevated PSA of 6.03, suspicious for prostate cancer, MRI with PIRADS 2 and 3 lesions. Will defer to Urology for prostate biopsy if needed.  Krupa K. Rankin, MD Urology, PGY-1 Pager: [phone]  Percy MARLA Rankin, MD, MD  Colorectal Surgery       [1] Past Medical History: Diagnosis Date  . Hypertension   [2] Past Surgical History: Procedure Laterality Date  . IR RFA 1ST VEIN EXTREMITY LEFT Left 01/06/2021   IR RFA 1ST VEIN EXTREMITY LEFT 01/06/2021  . IR RFA 1ST VEIN EXTREMITY LEFT Left 03/18/2020   IR RFA 1ST VEIN  EXTREMITY LEFT 03/18/2020  . IR RFA 1ST VEIN EXTREMITY LEFT Left 07/10/2019   IR RFA 1ST VEIN EXTREMITY LEFT 07/10/2019  . PR CRANIOFACIAL APPROACH,EXTRADURAL+ Left 11/15/2017   Procedure: CRANIOFAC-ANT CRAN FOSSA; XTRDURL INCL MAXILLECT;  Surgeon: Carlin Garnette Radford, MD;  Location: MAIN OR [facility];  Service: ENT  . PR MUSC MYOQ/FSCQ FLAP HEAD&NECK W/NAMED VASC PEDCL N/A 11/15/2017   Procedure: MUSCLE, MYOCUTANEOUS, OR FASCIOCUTANEOUS FLAP; HEAD AND NECK WITH NAMED VASCULAR PEDICLE (IE, BUCCINATORS, GENIOGLOSSUS, TEMPORALIS, MASSETER, STERNOCLEIDOMASTOID, LEVATOR SCAPULAE);  Surgeon: Carlin Garnette Radford, MD;  Location: MAIN OR [facility];  Service: ENT  . PR NASAL/SINUS ENDOSCOPY,RMV TISS MAXILL SINUS Right 11/15/2017   Procedure: NASAL/SINUS ENDOSCOPY, SURGICAL WITH MAXILLARY ANTROSTOMY; WITH REMOVAL OF TISSUE FROM MAXILLARY SINUS;  Surgeon: Carlin Garnette Radford, MD;  Location: MAIN OR [facility];  Service: ENT  . PR NASAL/SINUS NDSC W/PARTIAL ETHMOIDECTOMY Right 11/15/2017   Procedure: NASAL/SINUS ENDO-OR; BRIANA AKIN;  Surgeon: Carlin Garnette Radford, MD;  Location: MAIN OR [facility];  Service: ENT  . PR NASAL/SINUS NDSC W/RMVL TISS FROM FRONTAL SINUS Right 11/15/2017   Procedure: NASAL/SINUS ENDOSCOPY, SURGICAL, WITH FRONTAL SINUS EXPLORATION, INCLUDING REMOVAL OF TISSUE FROM FRONTAL SINUS, WHEN PERFORMED;  Surgeon: Carlin Garnette Radford, MD;  Location: MAIN OR [facility];  Service: ENT  . PR REMV TISSUE FOR GRAFT OTHR N/A 11/15/2017   Procedure: TISSUE GRAFTS, OTHER (EG, PARATENON, FAT, DERMIS);  Surgeon: Carlin Garnette Radford, MD;  Location: MAIN OR [facility];  Service: ENT  . PR RESECT BASE ANT CRAN FOSSA/EXTRADURL Left 11/15/2017   Procedure: RESECTION/EXCISION LESION BASE ANTERIOR CRANIAL FOSSA; EXTRADURAL;  Surgeon: Carlin Garnette Radford, MD;  Location: MAIN OR [facility];  Service: ENT  . PR STEREOTACTIC COMP ASSIST PROC,CRANIAL,EXTRADURAL N/A 11/15/2017   Procedure: STEREOTACTIC COMPUTER-ASSISTED (NAVIGATIONAL) PROCEDURE; CRANIAL,  EXTRADURAL;  Surgeon: Carlin Garnette Radford, MD;  Location: MAIN OR [facility];  Service: ENT  . TOE SURGERY Left   [3] No Known Allergies [4] Current Outpatient Medications  Medication Sig Dispense Refill  . albuterol  2.5 mg /  3 mL (0.083 %) nebulizer solution USE 1 VIAL VIA NEBULIZER EVERY 6 HOURS AS NEEDED FOR WHEEZING OR SHORTNESS OF BREATH    . albuterol  HFA 90 mcg/actuation inhaler INHALE 2 PUFFS BY MOUTH EVERY 6 HOURS AS NEEDED FOR WHEEZING OR SHORTNESS OF BREATH    . amLODIPine  (NORVASC ) 10 MG tablet Take 1 tablet (10 mg total) by mouth.    . benzonatate  (TESSALON ) 100 MG capsule     . cloNIDine  HCL (CATAPRES ) 0.1 MG tablet Take 1 tablet (0.1 mg total) by mouth.    . dilTIAZem  (CARDIZEM  CD) 120 MG 24 hr capsule     . ELIQUIS  5 mg Tab     . fluticasone  propionate (FLONASE ) 50 mcg/actuation nasal spray 2 sprays each nostril BID. DISP#  2 bottles = 1 month supply. 32 g 12  . hydrALAZINE  (APRESOLINE ) 100 MG tablet Take 200 mg by mouth.    . hydroCHLOROthiazide  (HYDRODIURIL ) 12.5 MG tablet Take 1 tablet (12.5 mg total) by mouth daily.  5  . HYDROcodone -acetaminophen  (NORCO) 5-325 mg per tablet TAKE 1 TABLET BY MOUTH EVERY 8 HOURS AS NEEDED FOR SEVERE PAIN    . loratadine  (CLARITIN ) 10 mg tablet     . methocarbamol  (ROBAXIN ) 500 MG tablet     . metoPROLOL  succinate (TOPROL -XL) 25 MG 24 hr tablet     . olmesartan  (BENICAR ) 40 MG tablet Take 1 tablet (40 mg total) by mouth daily.    . pantoprazole  (PROTONIX ) 20 MG tablet     . prochlorperazine  (COMPAZINE ) 10 MG tablet Take 1 tablet (10 mg total) by mouth.    . SYMBICORT  160-4.5 mcg/actuation inhaler     . sulfamethoxazole -trimethoprim  (BACTRIM  DS) 800-160 mg per tablet Take 1 tablet (160 mg of trimethoprim  total) by mouth every twelve (12) hours. 60 tablet 1   No current facility-administered medications for this visit.  [5] No family history on file. [6] Social History Tobacco Use  . Smoking status: Every Day    Current packs/day: 0.50     Average packs/day: 0.5 packs/day for 41.5 years (20.7 ttl pk-yrs)    Types: Cigarettes    Start date: 1984    Passive exposure: Current  . Smokeless tobacco: Never  Vaping Use  . Vaping status: Never Used  Substance Use Topics  . Alcohol use: Yes    Comment: 3 beers a day  . Drug use: No

## 2024-01-02 ENCOUNTER — Other Ambulatory Visit: Payer: Self-pay | Admitting: Oncology

## 2024-01-10 ENCOUNTER — Telehealth: Payer: Self-pay

## 2024-01-10 NOTE — Telephone Encounter (Signed)
-----   Message from Zelphia Cap sent at 01/08/2024 10:36 PM EDT ----- Please follow up on him to see if he is getting surgery done. How much abx supply does he have? He needs to stay abx until surgery. Did [facility] give him any abx.  Thanks.

## 2024-01-10 NOTE — Telephone Encounter (Signed)
 Spoke to pt and he states that he does not have a surgery date yet. He has an appt at [facility] on 7/15. Informed him that he is to be on abx until surgery and he states he has enough supply. Advised him to call us  once knows surgery date.

## 2024-01-21 ENCOUNTER — Telehealth: Payer: Self-pay

## 2024-01-21 NOTE — Telephone Encounter (Signed)
 FMLA update request received and completed pending MD signature.  Informed Mrs. Mintzer that she will need to obtain FMLA for [facility] providers that may require work absences.

## 2024-01-21 NOTE — Telephone Encounter (Signed)
Completed paperwork faxed 

## 2024-01-22 ENCOUNTER — Other Ambulatory Visit: Payer: Self-pay | Admitting: Oncology

## 2024-02-01 ENCOUNTER — Inpatient Hospital Stay: Attending: Oncology

## 2024-02-01 ENCOUNTER — Other Ambulatory Visit: Payer: Self-pay | Admitting: Oncology

## 2024-02-01 ENCOUNTER — Other Ambulatory Visit: Payer: Self-pay | Admitting: *Deleted

## 2024-02-01 ENCOUNTER — Telehealth: Payer: Self-pay | Admitting: *Deleted

## 2024-02-01 DIAGNOSIS — C3491 Malignant neoplasm of unspecified part of right bronchus or lung: Secondary | ICD-10-CM | POA: Diagnosis present

## 2024-02-01 DIAGNOSIS — Z95828 Presence of other vascular implants and grafts: Secondary | ICD-10-CM

## 2024-02-01 DIAGNOSIS — Z452 Encounter for adjustment and management of vascular access device: Secondary | ICD-10-CM | POA: Diagnosis present

## 2024-02-01 MED ORDER — PRADAXA 150 MG PO PACK: 150.0000 mg | PACK | Freq: Two times a day (BID) | ORAL | 1 refills | Status: DC

## 2024-02-01 MED ORDER — HEPARIN SOD (PORK) LOCK FLUSH 100 UNIT/ML IV SOLN
500.0000 [IU] | Freq: Once | INTRAVENOUS | Status: AC
  Administered 2024-02-01: 500 [IU] via INTRAVENOUS
  Filled 2024-02-01: qty 5

## 2024-02-01 MED ORDER — SODIUM CHLORIDE 0.9% FLUSH
10.0000 mL | Freq: Once | INTRAVENOUS | Status: AC
  Administered 2024-02-01: 10 mL via INTRAVENOUS
  Filled 2024-02-01: qty 10

## 2024-02-01 NOTE — Telephone Encounter (Signed)
 Patient called in 2 more minutes with the wife Olam.  They wanted to see if there is any way that Dr. Babara could help a refill of Eliquis  but they say its $400 and he is on government insurance and they were going to use a card but because he is on the government insurance you cannot do that and they also wanted a refill of Breztri .  Dr. Babara did not put the order in for Eliquis  or Breztri .  I Spoke to [place] the nurse for Dr. Babara and in the past he has been getting the medicine of the Eliquis .  Dr. Babara is gone today so we will not be able to ask her until Monday.  Times we can help certain people but the lady that it is doing that she will not be back until Monday also.  They will have to wait till Monday to see if they can do anything.  He says that thank you from what listening.

## 2024-02-04 ENCOUNTER — Telehealth: Payer: Self-pay

## 2024-02-04 NOTE — Telephone Encounter (Signed)
 PA completed for Dabigatran  Etexilate Mesylate 150mg  capsules. APPROVED through 07/09/24.   KeyBETHA GENTRY PA Case ID #: [id number].   Fax sent to [facility].

## 2024-02-16 ENCOUNTER — Other Ambulatory Visit: Payer: Self-pay | Admitting: Oncology

## 2024-02-21 ENCOUNTER — Other Ambulatory Visit: Payer: Self-pay

## 2024-02-21 ENCOUNTER — Encounter: Payer: Self-pay | Admitting: Oncology

## 2024-03-04 ENCOUNTER — Other Ambulatory Visit: Payer: Self-pay | Admitting: Pulmonary Disease

## 2024-03-07 ENCOUNTER — Ambulatory Visit
Admission: RE | Admit: 2024-03-07 | Discharge: 2024-03-07 | Disposition: A | Discharge status: Home / Self Care | Source: Ambulatory Visit | Attending: Oncology | Admitting: Oncology

## 2024-03-07 ENCOUNTER — Other Ambulatory Visit: Payer: Self-pay

## 2024-03-07 DIAGNOSIS — K5730 Diverticulosis of large intestine without perforation or abscess without bleeding: Secondary | ICD-10-CM | POA: Diagnosis not present

## 2024-03-07 DIAGNOSIS — C3491 Malignant neoplasm of unspecified part of right bronchus or lung: Secondary | ICD-10-CM | POA: Diagnosis present

## 2024-03-07 DIAGNOSIS — Z86718 Personal history of other venous thrombosis and embolism: Secondary | ICD-10-CM | POA: Diagnosis present

## 2024-03-07 DIAGNOSIS — I2510 Atherosclerotic heart disease of native coronary artery without angina pectoris: Secondary | ICD-10-CM | POA: Insufficient documentation

## 2024-03-07 DIAGNOSIS — N323 Diverticulum of bladder: Secondary | ICD-10-CM | POA: Diagnosis not present

## 2024-03-07 DIAGNOSIS — J439 Emphysema, unspecified: Secondary | ICD-10-CM | POA: Diagnosis not present

## 2024-03-07 DIAGNOSIS — Z923 Personal history of irradiation: Secondary | ICD-10-CM | POA: Insufficient documentation

## 2024-03-07 MED ORDER — SULFAMETHOXAZOLE-TRIMETHOPRIM 800-160 MG PO TABS: 1.0000 | ORAL_TABLET | Freq: Two times a day (BID) | ORAL | 0 refills | Status: DC

## 2024-03-07 MED ORDER — HEPARIN SOD (PORK) LOCK FLUSH 100 UNIT/ML IV SOLN
INTRAVENOUS | Status: AC
  Filled 2024-03-07: qty 5

## 2024-03-07 MED ORDER — IOHEXOL 300 MG/ML  SOLN
100.0000 mL | Freq: Once | INTRAMUSCULAR | Status: AC | PRN
  Administered 2024-03-07: 100 mL via INTRAVENOUS

## 2024-03-17 ENCOUNTER — Inpatient Hospital Stay

## 2024-03-17 ENCOUNTER — Inpatient Hospital Stay: Attending: Oncology

## 2024-03-17 ENCOUNTER — Encounter: Payer: Self-pay | Admitting: Oncology

## 2024-03-17 ENCOUNTER — Inpatient Hospital Stay (HOSPITAL_BASED_OUTPATIENT_CLINIC_OR_DEPARTMENT_OTHER): Admitting: Oncology

## 2024-03-17 VITALS — BP 117/67 | HR 84 | Temp 96.0°F | Resp 18 | Wt 121.5 lb

## 2024-03-17 DIAGNOSIS — Z9221 Personal history of antineoplastic chemotherapy: Secondary | ICD-10-CM | POA: Insufficient documentation

## 2024-03-17 DIAGNOSIS — Z7901 Long term (current) use of anticoagulants: Secondary | ICD-10-CM | POA: Diagnosis not present

## 2024-03-17 DIAGNOSIS — C3491 Malignant neoplasm of unspecified part of right bronchus or lung: Secondary | ICD-10-CM | POA: Insufficient documentation

## 2024-03-17 DIAGNOSIS — Z923 Personal history of irradiation: Secondary | ICD-10-CM | POA: Diagnosis not present

## 2024-03-17 DIAGNOSIS — Z86718 Personal history of other venous thrombosis and embolism: Secondary | ICD-10-CM | POA: Diagnosis not present

## 2024-03-17 DIAGNOSIS — N321 Vesicointestinal fistula: Secondary | ICD-10-CM | POA: Insufficient documentation

## 2024-03-17 DIAGNOSIS — Z79899 Other long term (current) drug therapy: Secondary | ICD-10-CM | POA: Insufficient documentation

## 2024-03-17 DIAGNOSIS — I5022 Chronic systolic (congestive) heart failure: Secondary | ICD-10-CM | POA: Insufficient documentation

## 2024-03-17 DIAGNOSIS — Z7952 Long term (current) use of systemic steroids: Secondary | ICD-10-CM | POA: Diagnosis not present

## 2024-03-17 DIAGNOSIS — Z8673 Personal history of transient ischemic attack (TIA), and cerebral infarction without residual deficits: Secondary | ICD-10-CM

## 2024-03-17 DIAGNOSIS — R634 Abnormal weight loss: Secondary | ICD-10-CM

## 2024-03-17 DIAGNOSIS — Z86711 Personal history of pulmonary embolism: Secondary | ICD-10-CM | POA: Insufficient documentation

## 2024-03-17 DIAGNOSIS — I110 Hypertensive heart disease with heart failure: Secondary | ICD-10-CM | POA: Diagnosis not present

## 2024-03-17 DIAGNOSIS — Z902 Acquired absence of lung [part of]: Secondary | ICD-10-CM | POA: Insufficient documentation

## 2024-03-17 DIAGNOSIS — Z87891 Personal history of nicotine dependence: Secondary | ICD-10-CM | POA: Insufficient documentation

## 2024-03-17 DIAGNOSIS — R630 Anorexia: Secondary | ICD-10-CM

## 2024-03-17 DIAGNOSIS — J850 Gangrene and necrosis of lung: Secondary | ICD-10-CM

## 2024-03-17 LAB — CBC WITH DIFFERENTIAL (CANCER CENTER ONLY)
Abs Immature Granulocytes: 0.06 K/uL (ref 0.00–0.07)
Basophils Absolute: 0.1 K/uL (ref 0.0–0.1)
Basophils Relative: 1 %
Eosinophils Absolute: 0.3 K/uL (ref 0.0–0.5)
Eosinophils Relative: 2 %
HCT: 36.6 % — ABNORMAL LOW (ref 39.0–52.0)
Hemoglobin: 13 g/dL (ref 13.0–17.0)
Immature Granulocytes: 0 %
Lymphocytes Relative: 10 %
Lymphs Abs: 1.5 K/uL (ref 0.7–4.0)
MCH: 37.4 pg — ABNORMAL HIGH (ref 26.0–34.0)
MCHC: 35.5 g/dL (ref 30.0–36.0)
MCV: 105.2 fL — ABNORMAL HIGH (ref 80.0–100.0)
Monocytes Absolute: 1.2 K/uL — ABNORMAL HIGH (ref 0.1–1.0)
Monocytes Relative: 8 %
Neutro Abs: 11.6 K/uL — ABNORMAL HIGH (ref 1.7–7.7)
Neutrophils Relative %: 79 %
Platelet Count: 278 K/uL (ref 150–400)
RBC: 3.48 MIL/uL — ABNORMAL LOW (ref 4.22–5.81)
RDW: 15.8 % — ABNORMAL HIGH (ref 11.5–15.5)
WBC Count: 14.7 K/uL — ABNORMAL HIGH (ref 4.0–10.5)
nRBC: 0 % (ref 0.0–0.2)

## 2024-03-17 LAB — CMP (CANCER CENTER ONLY)
ALT: 10 U/L (ref 0–44)
AST: 18 U/L (ref 15–41)
Albumin: 3.3 g/dL — ABNORMAL LOW (ref 3.5–5.0)
Alkaline Phosphatase: 91 U/L (ref 38–126)
Anion gap: 11 (ref 5–15)
BUN: 17 mg/dL (ref 6–20)
CO2: 22 mmol/L (ref 22–32)
Calcium: 8.7 mg/dL — ABNORMAL LOW (ref 8.9–10.3)
Chloride: 98 mmol/L (ref 98–111)
Creatinine: 1.08 mg/dL (ref 0.61–1.24)
GFR, Estimated: >60 mL/min (ref >60)
Glucose, Bld: 96 mg/dL (ref 70–99)
Potassium: 3.9 mmol/L (ref 3.5–5.1)
Sodium: 131 mmol/L — ABNORMAL LOW (ref 135–145)
Total Bilirubin: 0.4 mg/dL (ref 0.0–1.2)
Total Protein: 7.4 g/dL (ref 6.5–8.1)

## 2024-03-17 LAB — MAGNESIUM: Magnesium: 1.5 mg/dL — ABNORMAL LOW (ref 1.7–2.4)

## 2024-03-17 MED ORDER — SULFAMETHOXAZOLE-TRIMETHOPRIM 800-160 MG PO TABS: 1.0000 | ORAL_TABLET | Freq: Two times a day (BID) | ORAL | 2 refills | Status: DC

## 2024-03-17 MED ORDER — ALTEPLASE 2 MG IJ SOLR
2.0000 mg | Freq: Once | INTRAMUSCULAR | Status: AC
  Administered 2024-03-17: 2 mg
  Filled 2024-03-17: qty 2

## 2024-03-17 NOTE — Assessment & Plan Note (Signed)
 Unprovoked left lower extremity DVT, status post thrombolysis/thrombectomy, Nonocclusive PE  Continue Eliquis  5 mg twice daily,

## 2024-03-17 NOTE — Progress Notes (Signed)
 Hematology/Oncology Progress note [phone] [phone]    Reason for Visit  Follow up for lung cancer treatments.   ASSESSMENT & PLAN:   Cancer Staging  Squamous cell carcinoma of right lung [facility]) Staging form: Lung, AJCC 8th Edition - Clinical stage from 07/08/2020: Stage IVA (rcT4, rcM1a) - Signed by Babara Call, MD on 07/08/2020   Squamous cell carcinoma of right lung (HCC) 07/16/2020-09/28/2020 carboplatin  Taxol  and Keytruda  x 1  partial response -Keytruda  maintenance.-04/22/2021, progression-resumed on carboplatin /Taxol /Keytruda  - did not tolerate due to myelosuppression.-->  Taxol  held, carboplatin /Keytruda --> CT 07/2020 progression --> 08/03/2021 started docetaxel  and ramucirumab .-->confirmed to have necrotizing pneumonia pseudomonas--> chemo discontinued.  Labs are reviewed and discussed with patient. CT scan showed no signs of cancer progression. -Stable and new right lung base nodularity, close monitor. Repeat CT chest abdomen pelvis showed increase complex cystic lesion of the anterior right lower lobe. Discussed with pulmonology  History of CVA (cerebrovascular accident) Patient is on Eliquis  5 mg twice daily for anticoagulation.  History of DVT and PE Unprovoked left lower extremity DVT, status post thrombolysis/thrombectomy, Nonocclusive PE  Continue Eliquis  5 mg twice daily,  Hypomagnesemia Chronic hypomagnesia Recommend slow mag 1 tab, continue twice daily   Necrotic pneumonia (HCC) Interval increase of cystic lesion anterior right lower lobe. 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 Tamea.  He missed an appointment and will reschedule appt with pulmonology  Colovesical fistula Extensive colonic diverticulosis with chronic colovesical fistula  Now he starts to have chronic UTI symptoms.  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  Continue Bactrim . Awaiting cardiology and pulmonology clearance for urology intervention.   Weight  loss Possibly due to recent CVA hospitalization, UTI. Recommend nutritionist evaluation.  Recommend nutrition supplements.    Orders Placed This Encounter  Procedures   Ambulatory Referral to [facility] Nutrition    Referral Priority:   Routine    Referral Type:   Consultation    Referral Reason:   Specialty Services Required    Number of Visits Requested:   1    Follow-up 3 months  All questions were answered. The patient knows to call the clinic with any problems, questions or concerns.  Call Babara, MD, PhD [facility] Hematology Oncology 03/17/2024     HPI  Oncology History  Squamous cell carcinoma of right lung [facility])  08/13/2018 Surgery   s/p Bronchoscopy on 06/25/2018. subcarina EBUS FNA was non diagnostic, hypocellular specimen.  08/13/18 Patient underwent right thoracotomy and wedge resection of a right upper lobe mass.  Frozen section was consistent with an inflammatory process.  On the second postop day, preliminary pathology reports high-grade malignancy.  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  08/20/2018 Patient was therefore taken back to the OR and underwent take complete lobectomy  pT1b pN0 cM0 stage I squamous cell lung cancer. Margin is negative.  Recommend observation.     08/13/2018 Initial Diagnosis   Stage I Squamous cell carcinoma of lung, right (HCC)   12/03/2018 Progression   12/03/2018 CT chest showed abnormal soft tissue in the right suprahilar region measuring up to 2.5 cm, worrisome for recurrence 12/10/2018 PET Hypermetabolic RIGHT suprahilar mass consistent with lung cancer, recurrence. 12/26/2018 s/p bronchoscopy biopsy. Confirmed local recurrence of squamous cell lung cancer.  medi port placed by Dr.Oaks. 01/20/19 MRI brain w/wo contrast: No evidence of metastatic disease   01/17/2019 - 03/07/2019 Chemotherapy   concurrent chemoradiation on 01/16/2019 Carboplatin  AUC of 2 and Taxol  45 mg/m2 weekly finished in 03/05/2019  03/31/2019 Imaging   03/31/19  CT chest showed Right suprahilar lesion has decreased slightly in size in the interval since prior PET-CT.  No new or progressive findings on today's study.   04/10/2019 - 05/20/2020 Chemotherapy   04/10/2019, patient started on durvalumab  Q 2 weeks maintenance.    06/14/2020 Progression   06/14/2020 CT chest w contrast showed vidence of progressive lymphangitic spread of tumor throughout the right lung, with contralateral lung nodules with  right pleural effusion  07/01/22 MRI brain is negative for CNS involvement.  CT abdomen with contrast showed no evidence of abdominal metastatic disease PET scan was not approved by insurance-peer to peer appeal with Dr.Vipul Bhanderi    07/08/2020 Cancer Staging   Staging form: Lung, AJCC 8th Edition - Clinical stage from 07/08/2020: Stage IVA (rcT4, cM1a) - Signed by Babara Call, MD on 07/08/2020   07/16/2020 - 09/28/2021 Chemotherapy   Carboplatin  + Paclitaxel  + Pembrolizumab  q21d x 4 cycles      10/15/2020 Imaging   10/15/2020 CT chest abdomen pelvis redemonstrated postoperative and postradiation appearance of the right chest with perihilar consolidation and fibrosis.  Slight interval decrease in size of the pulmonary nodules associated with interlobular septal thickening nearly complete resolution of previously seen left-sided pulmonary nodules.  Minimal irregular residual.  Right pleural effusion improved.  Consistent with treatment response.  No evidence of metastatic disease with the abdomen or pelvis.  None obstructive bilateral nephrolithiasis  - partial response to the treatment-  12/15/2020, CT chest without contrast showed resolution of lung nodules, no evidence of recurrent disease   10/19/2020 -  Chemotherapy   Keytruda  monotherapy maintenance   12/30/2020 [facility] Admission   patient was admitted due to unprovoked extensive deep vein thrombosis from calf vein to the common femoral vein on the left.  Patient was started on heparin  drip.  Patient  underwent thrombolysis and is a colectomy on 12/31/2020.  Anticoagulation was switched to Eliquis  at discharge   04/22/2021 Imaging   CT chest with contrast showed none occlusive pulmonary embolism, Interval progression of consolidation airspace opacity in the medial right middle lobe with bandlike opacity and bronchiectasis in the perihilar right lung,-this is compatible with evolving posttreatment changes although recurrent disease anteriorly is not excluded.Interval development of numerous bilateral pulmonary nodules, measuring up to 11 mm in the right lung base.  Concerning for metastatic disease.  Tiny right pleural effusion.    04/26/2021 -  Chemotherapy   resumed on carboplatin /Taxol /Keytruda . Patient did not tolerate his chemotherapy of carboplatin /Taxol /Keytruda  on 04/26/2021. Patient has developed severe anemia status post PRBC transfusion. 05/25/2021, carboplatin  AUC 5 with Keytruda  06/23/2021  continued on carboplatin  AUC 5 with Keytruda  07/07/2021 carboplatin  with keytruda       07/15/2021 Progression   07/15/2021 CT chest with contrast showed enlarged right lower lobe thick-walled cavitary lesion in the anterior aspect of the right lower lobe.  Concerning for progression.  Multiple other small pulmonary nodules are also noted elsewhere in the lungs, many of which are stable compared to prior studies.  Several are new or clearly enlarged.  The best example is an enlarging pulmonary nodule in the left upper lobe, 8 x 6 mm comparing to 4 x 3 mm on 04/22/2021   08/03/2021 - 02/14/2022 Chemotherapy   LUNG Docetaxel  + Ramucirumab  q21d      11/16/2021 Imaging   1. The previously noted thick-walled cavity of concern in the right lower lobe now appears more simple and thin-walled in appearance, presumably a post infectious cavity.  There is a new adjacent thick-walled cavity in the right lower lobe which is aggressive in appearance, but given the evolution of the previous lesion, this may also be of  infectious etiology. 2. However, there is an enlarging mixed cystic and solid nodule in the left upper lobe which is concerning for metastatic lesion or new primary lesion. This currently measures 11 x 5 x 10 mm. Close attention on follow-up studies is recommended. 3. No definite signs of metastatic disease to the abdomen or pelvis. 4. Extensive colonic diverticulosis most involved in the sigmoid colon where there appear to be several small diverticular abscesses. one of these appears to fistulized into the wall of the urinary bladder, without frank fistulization to the lumen of the urinary bladder at this time.    02/01/2022 - 02/05/2022 Hospital Admission   Patient was admitted for HCAP, sepsis and COPD exacerbation.  Patient was treated with IV vancomycin , cefepime , prednisone , added doxycycline  to cover atypicals given CT findings.  Patient was discharged with Augmentin  and doxycycline  and completed 7 days of course.   02/01/2022 Imaging   CT angiogram chest with and without contrast 1. Bilateral chronic pulmonary embolus but no acute pulmonary embolus. 2. Probable pulmonary arterial hypertension. 3.  Aortic Atherosclerosis (ICD10-I70.0).  Coronary atherosclerosis. 4. Improved airspace opacity along the peripheral cavitary process in the right lower lobe, currently the thin walled air cyst or cavity measures about 1.4 cm in diameter. 5. Increased peripheral tree-in-bud reticulonodular opacities in both lower lobes in the lingula, compatible with atypical infection. 6. Airway thickening and airway plugging particularly in the right lower lobe. 7. Mosaic attenuation in the lungs, stable but nonspecific.8.  Emphysema (ICD10-J43.9). 9. Stable radiation therapy related findings in the right perihilar regions. Right upper lobectomy.   02/10/2022 - 02/12/2022 Hospital Admission   Patient was admitted due to acute hypoxic respiratory failure CT findings highly suspicious for MAC Patient had AFB sputum x 3  and has follow-up appointment with ID to review results.  Patient was discharged on 2 L of nasal cannula oxygen   02/10/2022 Imaging   CT chest without contrast 1. Cavitary lesions of the anterior right lower lobe demonstrate increased wall thickening and there is a new irregular solid pulmonary nodule of the left upper lobe. 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 2. Stable right paramediastinal postradiation change. 3. Similar bilateral mosaic attenuation, findings can be seen in setting of small airways disease or pulmonary hypertension.4. Aortic Atherosclerosis (ICD10-I70.0) and Emphysema (ICD10-J43.9).   07/17/2022 Imaging   CT chest abdomen pelvis w contrast 1. Unchanged post treatment appearance of the right chest, with dense, perihilar fibrotic bronchiectasis, consolidation, and architectural distortion. 2. Unchanged multilobulated cystic lesion of the anterior right lower lobe, with some adjacent irregular consolidation. Findings are most consistent with sequelae of prior infection. 3. Irregular nodules previously described in the left upper lobe are almost completely resolved, consistent with resolving infection or inflammation. 4. Background of very fine centrilobular and ground-glass nodularity, most concentrated in the lung apices, most commonly seen in smoking-related respiratory bronchiolitis. 5. No evidence of metastatic disease in the abdomen or pelvis.Aortic Atherosclerosis    10/20/2022 Imaging   CT chest wo contrast  1. Stable right perihilar postradiation change with no evidence of recurrent or metastatic disease. 2. Redemonstration of multiloculated cystic lesion of the anterior right lower lobe. Adjacent irregular consolidation is slightly decreased in  size. Findings are likely sequela of prior infection. 3. Numerous  small centrilobular pulmonary nodules which are most numerous in the upper lungs, similar to prior likely due to smoking-related respiratory bronchiolitis. 4. Aortic Atherosclerosis   08/27/2023 Imaging   CT chest abdomen pelvis w contrast showed 1. Status post right upper lobectomy. 2. An area of cystic bronchiectasis in the right lung base demonstrates nodularity within, felt to be relatively similar to 06/14/2023. This could represent superimposed infection, including fungal ball. Metastatic disease is felt less likely, especially given alteration of shaped/position today. Consider chest CT follow-up at 3 months. 3. Otherwise, no evidence of recurrent or metastatic disease. 4. Findings highly suspicious for colovesical fistula and superimposed cystitis. Likely secondary to interval episode of diverticulitis. 5. Aortic atherosclerosis (ICD10-I70.0), coronary artery atherosclerosis and emphysema (ICD10-J43.9). 6. Trace right pleural fluid 7. Persistent filling defect along the distal Port-A-Cath, which could represent adherent thrombus.   03/14/2024 Imaging   CT chest abdomen pelvis w contrast showed 1. Unchanged post treatment appearance of the chest with dense perihilar radiation fibrosis and consolidation of the right lung. 2. Redemonstrated complex cystic lesion of the anterior right lower lobe with a spiculated mural nodule of the posterior wall measuring 1.9 x 1.5 cm, previously 1.6 x 1.0 cm when measured similarly. Enlargement is worrisome for malignancy, although this could reflect infectious or inflammatory consolidation. PET-CT may be helpful to characterize for abnormal metabolic activity. 3. No evidence of lymphadenopathy or metastatic disease in the abdomen or pelvis. 4. Chronic diverticular abscess cavity embedded within the bladder dome, with diverticulosis of the adjacent sigmoid colon and chronic wall thickening, overall appearance unchanged. No acute  inflammatory findings. 5. Emphysema. 6. Coronary artery disease.    03/06/2022, sputum collected shows Pseudomonas  aeruginosa which explains his findings on the CT scan.  There were 2 types of pseudomonas colonies and they both are resistant o ciprofloxacin . patient was referred to infectious disease and was seen by Dr. Searcy on 03/09/2022.  Patient was treated with meropenem  and completed his course on 04/04/2022.    07/04/2024- 07/08/2024 He has had pneumonia due to RSV.  09/10/2023 - 09/12/2023  patient was admitted due to acute ischemic stroke.  He was supposed to take prophylactic anticoagulation with Eliquis  2.5 mg twice daily, patient self discontinued and was recommended to start Xarelto  10 mg daily.  Per admission note, patient was not taking Xarelto . Patient was evaluated by neurology.  He is left hemiparesis and weakness resolved.  Patient was started back on Eliquis  5 mg twice daily.  Patient also was treated for UTI.    April 2025, patient presented to primary care provider with symptoms of dysuria.  Patient was diagnosed with UTI and treated with Cefdinir.  INTERVAL HISTORY Samuel Choi is a 58 y.o. male who has above history reviewed by me today presents for follow up visit for metastatic squamous cell carcinoma of lung.  Patient is on chronic antibiotics for colovesical fistula, pending surgery repair. He is waiting for clearance from cardiology and pulmonology. Chronic dysuria symptoms  No fever or chills.  No flank pain.  Lost 5 pounds since last visit.    Review of Systems  Constitutional:  Positive for fatigue and unexpected weight change. Negative for appetite change, chills and fever.  HENT:   Negative for hearing loss and voice change.   Eyes:  Negative for eye problems and icterus.  Respiratory:  Positive for shortness of breath. Negative for chest tightness and cough.   Cardiovascular:  Negative for chest pain and leg swelling.  Gastrointestinal:  Negative for  abdominal distention, abdominal pain and blood in stool.  Endocrine: Negative for hot flashes.  Genitourinary:  Positive for dysuria and frequency. Negative for difficulty urinating.   Musculoskeletal:  Negative for arthralgias.  Skin:  Negative for itching and rash.  Neurological:  Negative for extremity weakness, light-headedness and numbness.  Hematological:  Negative for adenopathy. Does not bruise/bleed easily.  Psychiatric/Behavioral:  Negative for confusion.       VITALS:  Blood pressure 117/67, pulse 84, temperature (!) 96 F (35.6 C), temperature source Tympanic, resp. rate 18, weight 121 lb 8 oz (55.1 kg), SpO2 97%.  Wt Readings from Last 3 Encounters:  03/17/24 121 lb 8 oz (55.1 kg)  12/14/23 126 lb 12.8 oz (57.5 kg)  12/07/23 126 lb 12.8 oz (57.5 kg)    Body mass index is 16.03 kg/m.  Performance status (ECOG): 1 - Symptomatic but completely ambulatory  PHYSICAL EXAM:  Physical Exam HENT:     Head: Normocephalic and atraumatic.  Eyes:     General: No scleral icterus. Cardiovascular:     Rate and Rhythm: Normal rate.  Pulmonary:     Effort: Pulmonary effort is normal.     Breath sounds: No wheezing or rales.     Comments: Decreased breath sound bilaterally  Abdominal:     General: There is no distension.     Palpations: Abdomen is soft.     Tenderness: There is no abdominal tenderness.  Musculoskeletal:        General: No swelling.     Cervical back: Normal range of motion.  Skin:    General: Skin is warm and dry.  Neurological:     General: No focal deficit present.     Mental Status: He is alert. Mental status is at baseline.  Psychiatric:        Mood and Affect: Mood normal.     LABS:      Latest Ref Rng & Units 03/17/2024    9:23 AM 12/14/2023   12:05 PM 09/12/2023    4:36 AM  CBC  WBC 4.0 - 10.5 K/uL 14.7  10.8  12.5   Hemoglobin 13.0 - 17.0 g/dL 86.9  87.8  88.1   Hematocrit 39.0 - 52.0 % 36.6  35.9  33.9   Platelets 150 - 400 K/uL 278  257   224       Latest Ref Rng & Units 03/17/2024   10:50 AM 12/14/2023    8:21 AM 12/07/2023    9:19 AM  CMP  Glucose 70 - 99 mg/dL 96  897  894   BUN 6 - 20 mg/dL 17  9  7    Creatinine 0.61 - 1.24 mg/dL 8.91  8.94  9.08   Sodium 135 - 145 mmol/L 131  135  135   Potassium 3.5 - 5.1 mmol/L 3.9  4.6  2.9   Chloride 98 - 111 mmol/L 98  105  101   CO2 22 - 32 mmol/L 22  21  24    Calcium  8.9 - 10.3 mg/dL 8.7  8.4  8.1   Total Protein 6.5 - 8.1 g/dL 7.4     Total Bilirubin 0.0 - 1.2 mg/dL 0.4     Alkaline Phos 38 - 126 U/L 91     AST 15 - 41 U/L 18     ALT 0 - 44 U/L 10        HISTORY:   Past Medical  History:  Diagnosis Date   Acute ischemic stroke (HCC) 09/11/2023   a.) MRI brain 09/11/2023: acute infarct along the anterior aspect of the RIGHT precentral gyrus   Alcohol abuse    usually drinks 2-3 drinks per day   Anemia    Aortic atherosclerosis (HCC)    CAD (coronary artery disease)    Cancer associated pain    Cerebral microvascular disease    Chronic sinusitis    Clostridioides difficile infection 06/27/2019   Colonic diverticular abscess 08/27/2023   Colovesical fistula 08/27/2023   COPD (chronic obstructive pulmonary disease) (HCC)    Deep vein thrombosis (DVT) of left lower extremity (HCC) 12/30/2020   a.) calf veins to the common femoral vein on the LEFT; s/p mechanical thrombectomy 12/31/2020   Diverticulosis    DOE (dyspnea on exertion)    Hepatic steatosis    HFrEF (heart failure with reduced ejection fraction) (HCC)    Hip fracture (HCC) 05/2018   no surgery   History of kidney stones 05/2018   Hypertension    Inappropriate sinus tachycardia (HCC)    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 mass benign   ELECTROMAGNETIC NAVIGATION BROCHOSCOPY Right 06/25/2018   Procedure: ELECTROMAGNETIC NAVIGATION BRONCHOSCOPY;  Surgeon: Isaiah Scrivener, MD;  Location: [facility] ORS;  Service: Cardiopulmonary;  Laterality: Right;   ENDOBRONCHIAL ULTRASOUND Right 06/25/2018   Procedure: ENDOBRONCHIAL ULTRASOUND;  Surgeon: Isaiah Scrivener, MD;  Location: [facility];  Service: Cardiopulmonary;  Laterality: Right;   ENDOBRONCHIAL ULTRASOUND Right 12/26/2018   Procedure: ENDOBRONCHIAL ULTRASOUND RIGHT;  Surgeon: Isaiah Scrivener, MD;  Location: [facility];  Service: Cardiopulmonary;  Laterality: Right;   FLEXIBLE BRONCHOSCOPY N/A 08/20/2018   Procedure: FLEXIBLE BRONCHOSCOPY PREOP;  Surgeon: Volney Lye, MD;  Location: [facility];  Service: Thoracic;  Laterality: N/A;   IR CV LINE INJECTION  04/04/2019   NASAL SINUS SURGERY  10/2017   At [facility], frontal sinusotomy, ethmoidectomy, resection anterior cranial fossa neoplasm, turbinate resection   PERIPHERAL VASCULAR THROMBECTOMY Left 12/31/2020   Procedure: PERIPHERAL VASCULAR THROMBECTOMY;  Surgeon: Marea Selinda RAMAN, MD;  Location: [facility];  Service: Cardiovascular;  Laterality: Left;   PORTACATH PLACEMENT Left 01/15/2019   Procedure: INSERTION PORT-A-CATH;  Surgeon: Volney Lye, MD;  Location: [facility];  Service: General;  Laterality: Left;   THORACOTOMY Right 08/13/2018   Procedure: PREOP BROCHOSCOPY WITH RIGHT THORACOTOMY AND RUL RESECTION;  Surgeon: Volney Lye, MD;  Location:  [facility];  Service: General;  Laterality: Right;   THORACOTOMY Right 08/20/2018   Procedure: THORACOTOMY MAJOR RIGHT UPPER LOBE LOBECTOMY;  Surgeon: Volney Lye, MD;  Location: [facility];  Service: Thoracic;  Laterality: Right;   TOE SURGERY Left    pin in left toe    Family History  Problem Relation Age of Onset  Breast cancer Mother    Diabetes Mother    Lung cancer Father    Hypertension Father     Social History:  reports that he quit smoking about 5 years ago. His smoking use included cigarettes. He started smoking about 20 years ago. He has a 7.5 pack-year smoking history. He has never used smokeless tobacco. He reports current alcohol use of about 3.0 standard drinks of alcohol per week. He reports that he does not use drugs.  Allergies: No Known Allergies  Current Medications: Current Outpatient Medications  Medication Sig Dispense Refill   albuterol  (PROVENTIL ) (2.5 MG/3ML) 0.083% nebulizer solution Take 3 mLs (2.5 mg total) by nebulization every 6 (six) hours as needed for wheezing or shortness of breath. 120 mL 0   albuterol  (VENTOLIN  HFA) 108 (90 Base) MCG/ACT inhaler INHALE 2 PUFFS BY MOUTH EVERY 6 HOURS AS NEEDED FOR WHEEZING OR SHORTNESS OF BREATH 8.5 g 11   benzonatate  (TESSALON ) 200 MG capsule Take 1 capsule (200 mg total) by mouth 3 (three) times daily as needed for cough. 20 capsule 0   budesonide -glycopyrrolate -formoterol  (BREZTRI  AEROSPHERE) 160-9-4.8 MCG/ACT AERO inhaler INHALE 2 PUFFS INTO THE LUNGS IN THE MORNING AND AT BEDTIME 10.7 g 6   cloNIDine  (CATAPRES ) 0.1 MG tablet Take 1 tablet (0.1 mg total) by mouth 3 (three) times daily.     cyanocobalamin  (VITAMIN B12) 1000 MCG tablet Take 1 tablet (1,000 mcg total) by mouth daily. 90 tablet 1   Dabigatran  Etexilate Mesylate (PRADAXA ) 150 MG PACK Take 150 mg by mouth in the morning and at bedtime. 60 each 1   diltiazem  (CARDIZEM  CD) 120 MG 24 hr capsule Take 120 mg by mouth daily.     furosemide  (LASIX ) 40 MG tablet  Take 1 tablet (40 mg total) by mouth daily as needed for fluid or edema. 30 tablet 0   latanoprost  (XALATAN ) 0.005 % ophthalmic solution Place 1 drop into both eyes at bedtime.     loratadine  (CLARITIN ) 10 MG tablet TAKE 1 TABLET(10 MG) BY MOUTH DAILY AS NEEDED FOR ALLERGIES 90 tablet 1   magnesium  chloride (SLOW-MAG) 64 MG TBEC SR tablet Take 1 tablet (64 mg total) by mouth 2 (two) times daily. 180 tablet 1   metoprolol  succinate (TOPROL -XL) 25 MG 24 hr tablet Take 1 tablet (25 mg total) by mouth daily. 30 tablet 2   Multiple Vitamin (MULTIVITAMIN WITH MINERALS) TABS tablet Take 1 tablet by mouth daily.     pantoprazole  (PROTONIX ) 20 MG tablet TAKE 1 TABLET BY MOUTH DAILY 100 tablet 2   potassium chloride  SA (KLOR-CON  M) 20 MEQ tablet Take 1 tablet (20 mEq total) by mouth 2 (two) times daily. 30 tablet 0   feeding supplement (ENSURE ENLIVE / ENSURE PLUS) LIQD Take 237 mLs by mouth 3 (three) times daily between meals. (Patient not taking: Reported on 03/17/2024) 237 mL 12   potassium chloride  SA (KLOR-CON  M) 20 MEQ tablet Take 1 tablet (20 mEq total) by mouth daily. (Patient not taking: Reported on 03/17/2024) 3 tablet 0   sulfamethoxazole -trimethoprim  (BACTRIM  DS) 800-160 MG tablet Take 1 tablet by mouth 2 (two) times daily. 60 tablet 2   No current facility-administered medications for this visit.   Facility-Administered Medications Ordered in Other Visits  Medication Dose Route Frequency Provider Last Rate Last Admin   dexamethasone  (DECADRON ) 10 MG/ML injection            heparin  lock flush 100 unit/mL  500 Units Intravenous Once Eilan Mcinerny, MD  heparin  lock flush 100 unit/mL  500 Units Intravenous Once Babara Call, MD       sodium chloride  flush (NS) 0.9 % injection 10 mL  10 mL Intravenous PRN Babara Call, MD   10 mL at 04/04/19 9096   sodium chloride  flush (NS) 0.9 % injection 10 mL  10 mL Intravenous PRN Babara Call, MD   10 mL at 07/26/20 1308   sodium chloride  flush (NS) 0.9 % injection 10 mL   10 mL Intravenous PRN Babara Call, MD   10 mL at 07/28/21 9171   sodium chloride  flush (NS) 0.9 % injection 10 mL  10 mL Intravenous PRN Babara Call, MD   10 mL at 12/21/22 1308       IMAGE STUDIES:  CT CHEST ABDOMEN PELVIS W CONTRAST Result Date: 03/14/2024 CLINICAL DATA:  Squamous cell lung cancer restaging * Tracking Code: BO * EXAM: CT CHEST, ABDOMEN, AND PELVIS WITH CONTRAST TECHNIQUE: Multidetector CT imaging of the chest, abdomen and pelvis was performed following the standard protocol during bolus administration of intravenous contrast. RADIATION DOSE REDUCTION: This exam was performed according to the departmental dose-optimization program which includes automated exposure control, adjustment of the mA and/or kV according to patient size and/or use of iterative reconstruction technique. CONTRAST:  OMNIPAQUE  IOHEXOL  300 MG/ML  SOLN COMPARISON:  12/04/2023, 08/27/2023 FINDINGS: CT CHEST FINDINGS Cardiovascular: Left chest port catheter. Aortic atherosclerosis. Normal heart size. Left coronary artery calcifications. No pericardial effusion. Mediastinum/Nodes: No enlarged mediastinal, hilar, or axillary lymph nodes. Thyroid  gland, trachea, and esophagus demonstrate no significant findings. Lungs/Pleura: Unchanged post treatment appearance of the chest with dense perihilar radiation fibrosis and consolidation of the right lung (series 4, image 81). Redemonstrated complex cystic lesion of the anterior right lower lobe with a spiculated mural nodule of the posterior wall measuring 1.9 x 1.5 cm, previously 1.6 x 1.0 cm when measured similarly (series 4, image 109). Trace, loculated right pleural effusion. Moderate underlying emphysema. No pleural effusion or pneumothorax. Musculoskeletal: No chest wall abnormality. No acute osseous findings. CT ABDOMEN PELVIS FINDINGS Hepatobiliary: No solid liver abnormality is seen. No gallstones, gallbladder wall thickening, or biliary dilatation. Pancreas: Unremarkable.  No pancreatic ductal dilatation or surrounding inflammatory changes. Spleen: Normal in size without significant abnormality. Adrenals/Urinary Tract: Adrenal glands are unremarkable. Kidneys are normal, without renal calculi, solid lesion, or hydronephrosis. 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 Stomach/Bowel: Stomach is within normal limits. Appendix appears normal. No evidence of bowel wall thickening, distention, or inflammatory changes. Descending and sigmoid diverticulosis. Long segment wall thickening of the distal sigmoid colon without acute inflammatory findings. Vascular/Lymphatic: Severe aortic atherosclerosis. No enlarged abdominal or pelvic lymph nodes. Reproductive: No mass or other abnormality. Other: No abdominal wall hernia or abnormality. No ascites. Musculoskeletal: No acute osseous findings. IMPRESSION: 1. Unchanged post treatment appearance of the chest with dense perihilar radiation fibrosis and consolidation of the right lung. 2. Redemonstrated complex cystic lesion of the anterior right lower lobe with a spiculated mural nodule of the posterior wall measuring 1.9 x 1.5 cm, previously 1.6 x 1.0 cm when measured similarly. Enlargement is worrisome for malignancy, although this could reflect infectious or inflammatory consolidation. PET-CT may be helpful to characterize for abnormal metabolic activity. 3. No evidence of lymphadenopathy or metastatic disease in the abdomen or pelvis. 4. 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 No acute inflammatory findings. 5. Emphysema. 6. Coronary artery disease. Aortic Atherosclerosis (ICD10-I70.0) and Emphysema (ICD10-J43.9). Electronically Signed   By: Marolyn JONETTA Jaksch M.D.    On: 03/14/2024 09:45

## 2024-03-17 NOTE — Assessment & Plan Note (Signed)
 Interval increase of cystic lesion anterior right lower lobe. 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 Tamea.  He missed an appointment and will reschedule appt with pulmonology

## 2024-03-17 NOTE — Assessment & Plan Note (Signed)
 07/16/2020-09/28/2020 carboplatin  Taxol  and Keytruda  x 1  partial response -Keytruda  maintenance.-04/22/2021, progression-resumed on carboplatin /Taxol /Keytruda  - did not tolerate due to myelosuppression.-->  Taxol  held, carboplatin /Keytruda --> CT 07/2020 progression --> 08/03/2021 started docetaxel  and ramucirumab .-->confirmed to have necrotizing pneumonia pseudomonas--> chemo discontinued.  Labs are reviewed and discussed with patient. CT scan showed no signs of cancer progression. -Stable and new right lung base nodularity, close monitor. Repeat CT chest abdomen pelvis showed increase complex cystic lesion of the anterior right lower lobe. Discussed with pulmonology

## 2024-03-17 NOTE — Assessment & Plan Note (Signed)
 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  Continue Bactrim . Awaiting cardiology and pulmonology clearance for urology intervention.

## 2024-03-17 NOTE — Assessment & Plan Note (Signed)
 Chronic hypomagnesia Recommend slow mag 1 tab, continue twice daily

## 2024-03-17 NOTE — Assessment & Plan Note (Signed)
 Possibly due to recent CVA hospitalization, UTI. Recommend nutritionist evaluation.  Recommend nutrition supplements.

## 2024-03-17 NOTE — Assessment & Plan Note (Signed)
 Patient is on Eliquis  5 mg twice daily for anticoagulation.

## 2024-03-17 NOTE — Telephone Encounter (Signed)
 Called patient to introduced myself and move appointment to Thursday morning.  He said he will check his appointments but to put him down.  I also asked if he had decided and reached out to [facility] and he said not yet, and I told him to let me know if anything changes on that front.

## 2024-03-18 ENCOUNTER — Inpatient Hospital Stay

## 2024-03-18 MED ORDER — ONDANSETRON HCL 4 MG PO TABS: 4.0000 mg | ORAL_TABLET | Freq: Four times a day (QID) | ORAL | 1 refills | Status: AC | PRN

## 2024-03-18 NOTE — Progress Notes (Signed)
 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  Currently off treatment due to necrotizing pneumonia.  History of CVA, DVT, PE, colovesical fistula, chronic UTI.    Met with patient in clinic. Reports that his appetite is down due to recent dental work (all top teeth removed). More dental work pending on bottom teeth.  Also reports that he gets nauseated frequently due to what is going on in his stomach/GI tract.  He sees surgery at [facility] on 9/16 for further discussion about surgery.  Reports regular bowel movement daily.  Yesterday ate roast beef sandwich without difficulty.  For dinner ate salmon and 6 shrimp with mixed vegetables (broccoli, carrots) without difficulty.  Drinks soda.  Does not really like protein shakes but able to drink them.  Has not been drinking them recently.  Snacks on peanut butter nabs, potato chips.    NFPE: Orbital Region: severe Buccal Region: severe Upper Arm Region: severe Thoracic and Lumbar Region: severe Temple Region: severe Clavicle Bone Region: moderate Shoulder and Acromion Bone Region: severe Scapular Bone Region: severe Dorsal Hand: moderate Patellar Region: severe Anterior Thigh Region: severe Posterior Calf Region: severe Edema (RD assessment): none Hair: observed Eyes: observed Mouth: not observed Skin: observed Nails: observed   Medications: Vit b 12, potassium chloride , lasix , protonix , MVI  Labs: reviewed  Anthropometrics:   Height: 73 inches Weight: 119 lb today 126 lb on 6/6 115 lb on 09/12/23 (after admission with stroke) 123 lb on 2/24 132 lb on 07/06/23 147 lb on 07/05/23 (hospital admission with swelling in both legs) BMI: 16 5% weight loss in the last 3 months  Estimated Energy Needs  Kcals: 1620-1900 Protein: 65-81 g Fluid: > 1620 ml  NUTRITION DIAGNOSIS: Inadequate oral intake related to altered GI function (fistula), nausea, poor  dentition, chronic illness as evidenced by 5% weight loss in the last 3 months, decreased intake, severe muscle mass loss and fat loss   MALNUTRITION DIAGNOSIS: Patient meets criteria for severe malnutrition related to severe fat mass loss and muscle mass loss   INTERVENTION:  Message sent to provide for prescription antinausea medication Discussed foods to choose with nausea. Handout provided Encouraged small frequent meals q 2-3 hours Encouraged restarting oral nutrition supplements in anticipation of surgery Continue MVI daily Contact information provided    MONITORING, EVALUATION, GOAL: weight trends, intake   NEXT VISIT: as needed RD available as needed  Jaymason Ledesma B. Dasie SOLON, CSO, LDN Registered Dietitian [phone]

## 2024-03-18 NOTE — Addendum Note (Signed)
 Addended by: BABARA CALL on: 03/18/2024 11:13 PM   Modules accepted: Orders

## 2024-03-19 ENCOUNTER — Telehealth: Payer: Self-pay

## 2024-03-19 NOTE — Telephone Encounter (Signed)
-----   Message from Zelphia Cap sent at 03/18/2024 11:13 PM EDT ----- Sure. I have sent zofran  Rx. Please let pt know thanks. ----- Message ----- From: Dasie Simple, RD Sent: 03/18/2024  12:38 PM EDT To: Almarie JINNY Nett, RN; Zelphia Cap, MD  Dr Cap,  Mr Studley tells me that nausea is keeping him from eating more.  Would you be willing to prescribe him nausea medication?    Thanks, Joli

## 2024-03-19 NOTE — Telephone Encounter (Signed)
 Patient informed that Zofran  rx has been sent to [facility]

## 2024-04-02 ENCOUNTER — Ambulatory Visit: Admitting: Pulmonary Disease

## 2024-04-18 ENCOUNTER — Encounter: Payer: Self-pay | Admitting: Pulmonary Disease

## 2024-04-18 ENCOUNTER — Ambulatory Visit: Admitting: Pulmonary Disease

## 2024-04-18 VITALS — BP 132/80 | HR 88 | Temp 97.7°F | Ht 73.0 in | Wt 123.2 lb

## 2024-04-18 DIAGNOSIS — C3491 Malignant neoplasm of unspecified part of right bronchus or lung: Secondary | ICD-10-CM

## 2024-04-18 DIAGNOSIS — J439 Emphysema, unspecified: Secondary | ICD-10-CM

## 2024-04-18 DIAGNOSIS — B441 Other pulmonary aspergillosis: Secondary | ICD-10-CM

## 2024-04-18 DIAGNOSIS — C3490 Malignant neoplasm of unspecified part of unspecified bronchus or lung: Secondary | ICD-10-CM | POA: Diagnosis not present

## 2024-04-18 DIAGNOSIS — R0902 Hypoxemia: Secondary | ICD-10-CM

## 2024-04-18 DIAGNOSIS — I272 Pulmonary hypertension, unspecified: Secondary | ICD-10-CM | POA: Diagnosis not present

## 2024-04-18 DIAGNOSIS — J984 Other disorders of lung: Secondary | ICD-10-CM

## 2024-04-18 DIAGNOSIS — G4736 Sleep related hypoventilation in conditions classified elsewhere: Secondary | ICD-10-CM

## 2024-04-18 DIAGNOSIS — J449 Chronic obstructive pulmonary disease, unspecified: Secondary | ICD-10-CM

## 2024-04-18 DIAGNOSIS — Z87891 Personal history of nicotine dependence: Secondary | ICD-10-CM

## 2024-04-18 DIAGNOSIS — R918 Other nonspecific abnormal finding of lung field: Secondary | ICD-10-CM | POA: Diagnosis not present

## 2024-04-18 DIAGNOSIS — Z923 Personal history of irradiation: Secondary | ICD-10-CM

## 2024-04-18 MED ORDER — BREZTRI AEROSPHERE 160-9-4.8 MCG/ACT IN AERO: 2.0000 | INHALATION_SPRAY | Freq: Two times a day (BID) | RESPIRATORY_TRACT | 6 refills | Status: AC

## 2024-04-18 NOTE — Patient Instructions (Signed)
 VISIT SUMMARY:  You came in today for an evaluation of a lung mass. You have a history of COPD and have been using a Breztri  inhaler effectively. You have no significant cough and have not experienced any recent weight loss. You have a history of a severe lung infection and radiation therapy. Recent imaging showed an area of concern in your lung. Your scheduled bladder and intestinal surgery was postponed due to concerns about your lungs and heart, but recent heart catheterization showed better results than a previous echocardiogram.  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 We will order a PET CT scan to evaluate this and check for any recurrence of cancer. We will also consult with an infectious disease specialist to discuss potential antifungal treatment options.  -PULMONARY SCARRING POST-INFECTION AND RADIATION: Pulmonary scarring is the result of previous lung infections and radiation therapy. This scarring is present in your right lung.  -LUNG CANCER, STATUS POST RADIATION: You have been treated with radiation for lung cancer. Current imaging shows scarring and a potential aspergilloma, but no definitive signs of cancer recurrence.  -CHRONIC OBSTRUCTIVE PULMONARY DISEASE (COPD) WITH EMPHYSEMA: COPD with emphysema is a chronic lung condition that makes it hard to breathe. Your condition is well-managed with the Breztri  inhaler, and you are not experiencing any significant symptoms. Please continue using your Breztri  inhaler, and we will check if you need any refills.  -PRE-OPERATIVE EVALUATION FOR BLADDER AND INTESTINAL SURGERY: Your bladder and intestinal surgery is pending scheduling. We will proceed with the PET CT scan to assist in planning for your surgery.  INSTRUCTIONS:  Please schedule a PET CT scan as soon as possible. We will also arrange a  consultation with an infectious disease specialist to discuss potential antifungal treatments. Continue using your Breztri  inhaler and let us  know if you need any refills. We will keep you updated on the scheduling of your bladder and intestinal surgery.

## 2024-04-18 NOTE — Progress Notes (Signed)
 Subjective:    Patient ID: Samuel Choi, male    DOB: [birth year], 58 y.o.   MRN: [id number]  Patient Care Team: Sadie Manna, MD as PCP - General (Internal Medicine) Babara Call, MD as Consulting Physician (Hematology and Oncology) Fayette Bodily, MD as Consulting Physician (Infectious Diseases) Borders, Fonda SAUNDERS, NP as Nurse Practitioner (Hospice and Palliative Medicine) Tamea Dedra CROME, MD as Consulting Physician (Pulmonary Disease)  Chief Complaint  Patient presents with   COPD    Shortness of breath on exertion.     BACKGROUND/INTERVAL: Mr. Valente is a 58 year old very complex former smoker with a history as noted below who presents for reevaluation of cavitary lesion noted on the right lung.  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  He developed Pseudomonas necrotizing/cavitary pneumonia in August 2023 and required IV meropenem  for eradication.  He also has moderate/severe COPD and pulmonary hypertension.  Prior history of DVT and PE as well on Eliquis .  He was last seen on 10 April 2023.  This is an overdue follow-up.  HPI Discussed the use of AI scribe software for clinical note transcription with the patient, who gave verbal consent to proceed.  History of Present Illness   Samuel Choi is a 58 year old male with COPD who presents for evaluation of a cavitary lung lesion on the right lower lobe at the site of prior Pseudomonas necrotizing pneumonia.   He has a history of COPD and is currently using a Breztri  inhaler, which he finds effective. He experiences very little cough and denies smoking. No recent weight loss and his weight has remained stable.  He has a history of a severe lung infection and has undergone radiation therapy in the past. Recent imaging from April 07, 2024, shows an area of concern in the lung.  He was scheduled for surgery on his bladder and intestines, but it was postponed due to  concerns about potential issues with severe pulmonary hypertension. He underwent a heart catheterization recently, which showed better results than a previous echocardiogram.  Right heart catheterization was performed on 02 April 2024 showing precapillary pulmonary hypertension with mean pulmonary artery pressure at 35 mmHg and PVR at 5.8 Woods units.  The pulmonary artery systolic pressure by catheterization was notably much lower than estimated on echo at 58 mmHg.  PA pressure was 58/20 with a mean of 35.  Pulmonary capillary wedge pressure mean was 7.   Patient was discussed with Dr. Babara his oncologist, after review of the film we discussed that consideration should be given to potential aspergilloma in the prior cavity created by Pseudomonas pneumonia.  This is still a possibility.  While being evaluated at [facility] and ABPA panel was performed that showed that she has a total IgE of over 5000 with IgE to Aspergillus fumigated as being elevated at 2.66 kU/L and Aspergillus fumigate is mixed gel diffusion positive.  Fungitell assay was also noted to be indeterminate.    Samuel Choi does not endorse any significant symptomatology associated with these findings.   Review of Systems A 10 point review of systems was performed and it is as noted above otherwise negative.   Patient Active Problem List   Diagnosis Date Noted   UTI (urinary tract infection) 12/07/2023   Syncope 09/11/2023   VTE (venous thromboembolism) 09/11/2023   History of CVA (cerebrovascular accident) 09/10/2023   At high risk for bleeding after thrombolytic therapy 09/10/2023   RSV (respiratory syncytial virus pneumonia)  07/06/2023   Protein-calorie malnutrition, severe 07/06/2023   Pneumonia due to respiratory syncytial virus (RSV) 07/05/2023   Chronic anticoagulation 07/05/2023   Severe pulmonary hypertension (HCC) 07/05/2023   Severe tricuspid regurgitation by prior echocardiogram 07/05/2023   Alcohol use disorder 07/05/2023    CHF (congestive heart failure) (HCC) 06/29/2023   Hypoxia 06/14/2023   History of lung cancer 06/14/2023   COPD with acute exacerbation (HCC) 06/14/2023   Acute on chronic respiratory failure with hypoxia (HCC) 06/14/2023   Necrotic pneumonia (HCC) 04/07/2022   Cavitary lesion of lung 02/11/2022   Acute respiratory failure (HCC) 02/10/2022   Lactic acidosis 02/10/2022   SVT (supraventricular tachycardia) 02/02/2022   Sepsis (HCC) 02/01/2022   Hyponatremia 02/01/2022   Acute on chronic diastolic CHF (congestive heart failure) (HCC) 02/01/2022   Macrocytic anemia 02/01/2022   Elevated serum creatinine 01/25/2022   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 difficile colitis 10/19/2021   Hyperkalemia 07/28/2021   Protein calorie malnutrition 06/08/2021   Antineoplastic chemotherapy induced anemia 06/08/2021   Pulmonary embolus (HCC) 06/08/2021   Chemotherapy induced nausea and vomiting 05/17/2021   Symptomatic anemia 05/17/2021   Wide-complex tachycardia    Chemotherapy induced neutropenia 11/09/2020   COPD (chronic obstructive pulmonary disease) (HCC) 04/22/2020   Sinus tachycardia 12/18/2019   Hypomagnesemia 12/10/2019   Encounter for antineoplastic immunotherapy 11/24/2019   Ex-smoker 07/23/2019   Port-A-Cath in place 04/04/2019   Thrombocytopenia 04/04/2019   Folate deficiency 04/04/2019   Hypokalemia 04/04/2019   B12 deficiency 04/04/2019   Alcohol abuse 04/04/2019   Dehydration 02/07/2019   Encounter for antineoplastic chemotherapy 01/31/2019   Squamous cell carcinoma of right lung (HCC) 09/03/2018   Goals of care, counseling/discussion 09/03/2018   Malnutrition of moderate degree 08/23/2018   Emphysema of lung (HCC) 06/2018   Allergic rhinitis 06/2018   H/O nasal polyp 02/20/2018   Acute sinusitis 10/10/2017   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/10/2017   Chronic rhinitis 10/10/2017   Mass of sinus 10/10/2017   Essential  hypertension 09/27/2017   Tobacco abuse 09/27/2017    Social History   Tobacco Use   Smoking status: Former    Current packs/day: 0.00    Average packs/day: 0.5 packs/day for 15.0 years (7.5 ttl pk-yrs)    Types: Cigarettes    Start date: 08/2003    Quit date: 08/2018    Years since quitting: 5.7   Smokeless tobacco: Never  Substance Use Topics   Alcohol use: Yes    Alcohol/week: 3.0 standard drinks of alcohol    Types: 3 Cans of beer per week    Comment: usually 2 drinks per week, per patient    No Known Allergies  Current Meds  Medication Sig   albuterol  (PROVENTIL ) (2.5 MG/3ML) 0.083% nebulizer solution Take 3 mLs (2.5 mg total) by nebulization every 6 (six) hours as needed for wheezing or shortness of breath.   albuterol  (VENTOLIN  HFA) 108 (90 Base) MCG/ACT inhaler INHALE 2 PUFFS BY MOUTH EVERY 6 HOURS AS NEEDED FOR WHEEZING OR SHORTNESS OF BREATH   benzonatate  (TESSALON ) 200 MG capsule Take 1 capsule (200 mg total) by mouth 3 (three) times daily as needed for cough.   cloNIDine  (CATAPRES ) 0.1 MG tablet Take 1 tablet (0.1 mg total) by mouth 3 (three) times daily.   cyanocobalamin  (VITAMIN B12) 1000 MCG tablet Take 1 tablet (1,000 mcg total) by mouth daily.   Dabigatran  Etexilate Mesylate (PRADAXA ) 150 MG PACK Take 150 mg by  mouth in the morning and at bedtime.   diltiazem  (CARDIZEM  CD) 120 MG 24 hr capsule Take 120 mg by mouth daily.   ELIQUIS  5 MG TABS tablet Take 5 mg by mouth 2 (two) times daily.   furosemide  (LASIX ) 40 MG tablet Take 1 tablet (40 mg total) by mouth daily as needed for fluid or edema.   latanoprost  (XALATAN ) 0.005 % ophthalmic solution Place 1 drop into both eyes at bedtime.   loratadine  (CLARITIN ) 10 MG tablet TAKE 1 TABLET(10 MG) BY MOUTH DAILY AS NEEDED FOR ALLERGIES   magnesium  chloride (SLOW-MAG) 64 MG TBEC SR tablet Take 1 tablet (64 mg total) by mouth 2 (two) times daily.   metoprolol  succinate (TOPROL -XL) 25 MG 24 hr tablet Take 1 tablet (25 mg  total) by mouth daily.   Multiple Vitamin (MULTIVITAMIN WITH MINERALS) TABS tablet Take 1 tablet by mouth daily.   ondansetron  (ZOFRAN ) 4 MG tablet Take 1 tablet (4 mg total) by mouth every 6 (six) hours as needed for nausea or vomiting.   pantoprazole  (PROTONIX ) 20 MG tablet TAKE 1 TABLET BY MOUTH DAILY   sulfamethoxazole -trimethoprim  (BACTRIM  DS) 800-160 MG tablet Take 1 tablet by mouth 2 (two) times daily.   [DISCONTINUED] budesonide -glycopyrrolate -formoterol  (BREZTRI  AEROSPHERE) 160-9-4.8 MCG/ACT AERO inhaler INHALE 2 PUFFS INTO THE LUNGS IN THE MORNING AND AT BEDTIME    Immunization History  Administered Date(s) Administered   INFLUENZA, HIGH DOSE SEASONAL PF 03/28/2019   Influenza Inj Mdck Quad Pf 03/28/2019   Influenza, Mdck, Trivalent,PF 6+ MOS(egg free) 03/21/2023   Influenza,inj,Quad PF,6+ Mos 05/04/2021, 03/27/2022   Influenza-Unspecified 03/31/2022   Moderna Sars-Covid-2 Vaccination 11/14/2019   Pneumococcal Conjugate-13 02/17/2021   Pneumococcal Polysaccharide-23 11/19/2018   Tdap 12/31/2017   Zoster Recombinant(Shingrix) 03/28/2019, 05/01/2019, 06/09/2019        Objective:     BP 132/80   Pulse 88   Temp 97.7 F (36.5 C) (Temporal)   Ht 6' 1 (1.854 m)   Wt 123 lb 3.2 oz (55.9 kg)   SpO2 96%   BMI 16.25 kg/m   SpO2: 96 %  GENERAL: Thin, gentleman, presents fully ambulatory.  No acute distress. HEAD: Normocephalic, atraumatic.  Residual alopecia due to chemotherapy. EYES: Pupils equal, round, reactive to light.  No scleral icterus.  Disconjugate gaze (exotropia of right eye). MOUTH: Poor dentition, oral mucosa moist.  No thrush. NECK: Supple. No thyromegaly. Trachea midline. No JVD.  No adenopathy. PULMONARY: Good air entry bilaterally.  Coarse, otherwise, clear to auscultation bilaterally. CARDIOVASCULAR: S1 and S2.  Regular rate and rhythm.  No rubs, murmurs or gallops heard. ABDOMEN: Scaphoid, otherwise benign. MUSCULOSKELETAL: No joint deformity, no  clubbing, no edema.  Significant sarcopenia. NEUROLOGIC: Fully ambulatory today, occasional disconjugate gaze.  Otherwise no focality. SKIN: Intact,warm,dry. PSYCH: Mood and behavior normal.  Representative image from CT performed 07 March 2024 showing the lesion of concern (arrow):    Assessment & Plan:     ICD-10-CM   1. Stage 2 moderate COPD by GOLD classification (HCC)  J44.9     2. Nocturnal hypoxemia due to pulmonary hypertension (HCC)  I27.29    G47.36     3. Squamous cell carcinoma of right lung (HCC) Stage IV a  C34.91 NM PET Image Restage (PS) Skull Base to Thigh (F-18 FDG)    4. Cavitary lesion of lung  J98.4 NM PET Image Restage (PS) Skull Base to Thigh (F-18 FDG)    5. 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  Orders Placed This Encounter  Procedures   NM PET Image Restage (PS) Skull Base to Thigh (F-18 FDG)    Standing Status:   Future    Number of Occurrences:   1    Expected Date:   04/25/2024    Expiration Date:   04/18/2025    If indicated for the ordered procedure, I authorize the administration of a radiopharmaceutical per Radiology protocol:   Yes    Preferred imaging location?:   [facility]    Meds ordered this encounter  Medications   budesonide -glycopyrrolate -formoterol  (BREZTRI  AEROSPHERE) 160-9-4.8 MCG/ACT AERO inhaler    Sig: Inhale 2 puffs into the lungs in the morning and at bedtime.    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 Differential diagnosis includes recurrence of cancer. No significant weight loss reported. Minimal cough present. - Order PET CT to evaluate the suspected aspergilloma and assess for cancer recurrence - Pending PET/CT may require robotic assisted bronchoscopy  Pulmonary scarring post-infection and  radiation Pulmonary scarring present in the right lung due to previous infection and radiation therapy.  Lung cancer, status post radiation Treated with radiation. Current imaging shows scarring and potential aspergilloma versus malignancy, but no definitive signs of cancer recurrence.  Chronic obstructive pulmonary disease (COPD) with emphysema COPD with emphysema is well-managed. Continues to use Breztri  inhaler effectively. No current exacerbations or significant symptoms reported. - Continue Breztri  inhaler, continue albuterol  as needed - Check if refills are needed for inhalers  Pre-operative evaluation for bladder and intestinal surgery Surgery pending scheduling at [facility]. - Proceed with PET CT to assist in surgical planning      Advised if symptoms do not improve or worsen, to please contact office for sooner follow up or seek emergency care.    I spent 35 minutes of dedicated to the care of this patient on the date of this encounter to include pre-visit review of records, face-to-face time with the patient discussing conditions above, post visit ordering of testing, clinical documentation with the electronic health record, making appropriate referrals as documented, and communicating necessary findings to members of the patients care team.     C. Leita Sanders, MD Advanced Bronchoscopy P[facility]M [facility] [facility]    *This note was generated using voice recognition software/Dragon and/or AI transcription program.  Despite best efforts to proofread, errors can occur which can change the meaning. Any transcriptional errors that result from this process are unintentional and may not be fully corrected at the time of dictation.

## 2024-04-22 ENCOUNTER — Encounter
Admission: RE | Admit: 2024-04-22 | Discharge: 2024-04-22 | Disposition: A | Discharge status: Home / Self Care | Source: Ambulatory Visit | Attending: Pulmonary Disease | Admitting: Pulmonary Disease

## 2024-04-22 VITALS — Wt 124.0 lb

## 2024-04-22 DIAGNOSIS — J984 Other disorders of lung: Secondary | ICD-10-CM | POA: Diagnosis present

## 2024-04-22 DIAGNOSIS — C3491 Malignant neoplasm of unspecified part of right bronchus or lung: Secondary | ICD-10-CM | POA: Insufficient documentation

## 2024-04-22 LAB — GLUCOSE, CAPILLARY: Glucose-Capillary: 68 mg/dL — ABNORMAL LOW (ref 70–99)

## 2024-04-22 MED ORDER — FLUDEOXYGLUCOSE F - 18 (FDG) INJECTION
6.9600 | Freq: Once | INTRAVENOUS | Status: AC | PRN
  Administered 2024-04-22: 6.96 via INTRAVENOUS

## 2024-04-23 ENCOUNTER — Ambulatory Visit: Payer: Self-pay | Admitting: Pulmonary Disease

## 2024-04-23 DIAGNOSIS — C3491 Malignant neoplasm of unspecified part of right bronchus or lung: Secondary | ICD-10-CM

## 2024-04-24 NOTE — Progress Notes (Signed)
 Luverne Aran, MD sent to [facility] for bx of left cervical LN under US  under local; may need to be FNA only based on LN size by PET.  GY

## 2024-04-25 ENCOUNTER — Other Ambulatory Visit: Payer: Self-pay | Admitting: Pulmonary Disease

## 2024-04-25 ENCOUNTER — Encounter: Payer: Self-pay | Admitting: Pulmonary Disease

## 2024-04-25 DIAGNOSIS — R599 Enlarged lymph nodes, unspecified: Secondary | ICD-10-CM

## 2024-05-08 NOTE — Progress Notes (Signed)
 Patient for US  guided FNA LT cervical LN Biopsy on Friday 05/09/24, I called and spoke with the patient on the phone and gave pre-procedure instructions. Pt was made aware to be here at 12:30p and check in at the [facility] registration desk. Pt stated understanding. Called 05/08/24

## 2024-05-09 ENCOUNTER — Encounter: Payer: Self-pay | Admitting: Oncology

## 2024-05-09 ENCOUNTER — Ambulatory Visit
Admission: RE | Admit: 2024-05-09 | Discharge: 2024-05-09 | Disposition: A | Discharge status: Home / Self Care | Source: Ambulatory Visit | Attending: Pulmonary Disease | Admitting: Pulmonary Disease

## 2024-05-09 ENCOUNTER — Other Ambulatory Visit: Payer: Self-pay | Admitting: Pulmonary Disease

## 2024-05-09 DIAGNOSIS — R599 Enlarged lymph nodes, unspecified: Secondary | ICD-10-CM | POA: Diagnosis present

## 2024-05-09 DIAGNOSIS — D119 Benign neoplasm of major salivary gland, unspecified: Secondary | ICD-10-CM | POA: Insufficient documentation

## 2024-05-09 MED ORDER — LIDOCAINE HCL (PF) 1 % IJ SOLN
5.0000 mL | Freq: Once | INTRAMUSCULAR | Status: AC
  Administered 2024-05-09: 5 mL
  Filled 2024-05-09: qty 5

## 2024-05-09 NOTE — Procedures (Signed)
 Vascular and Interventional Radiology Procedure Note  Patient: Samuel Choi DOB: 16-Oct-1965 Medical Record Number: [id number] Note Date/Time: 05/09/24 1:05 PM   Performing Physician: Thom Hall, MD Assistant(s): None  Diagnosis: HM L cervical LN. Hx R lung mass.   Procedure: LEFT CERVICA LYMPH NODE BIOPSY  Anesthesia: Local Anesthetic Complications: None Estimated Blood Loss: Minimal Specimens: Sent for Pathology  Findings:  Successful Ultrasound-guided biopsy of a L cervical LN. A total of 3 samples were obtained. Hemostasis of the tract was achieved using Manual Pressure.  Plan: Bed rest for 0 hours.  See detailed procedure note with images in PACS. The patient tolerated the procedure well without incident or complication and was returned to Recovery in stable condition.    Thom Hall, MD [facility]   Pager. [phone] Clinic. [phone]

## 2024-05-12 ENCOUNTER — Telehealth: Payer: Self-pay

## 2024-05-12 NOTE — Telephone Encounter (Signed)
 [employer] requesting medical update.  Paperwork completed, pending Dr. Babara signature.

## 2024-05-12 NOTE — Telephone Encounter (Signed)
 Completed form faxed and copy mailed to patient.

## 2024-05-15 ENCOUNTER — Telehealth: Payer: Self-pay

## 2024-05-15 NOTE — Telephone Encounter (Signed)
 I spoke with the patient and told him Dr. Tamea is out of the office until Monday. I told him she would call him with the results as soon as she can.

## 2024-05-15 NOTE — Telephone Encounter (Signed)
 Copied from CRM [id number]. Topic: Clinical - Lab/Test Results >> May 15, 2024 10:39 AM Russell PARAS wrote: Reason for CRM:   Pt contacting results regarding his biopsy performed on 10/31. Results appear to have returned, with chart review.  Pt requested a call back for status update, or to review the results  CB#  [phone]

## 2024-05-19 NOTE — Telephone Encounter (Signed)
 I do not see the results back yet.  I am going to have to contact pathology to see what is the hold up.

## 2024-05-20 NOTE — Telephone Encounter (Signed)
 I have notified the patient. Nothing further needed.

## 2024-05-22 ENCOUNTER — Ambulatory Visit: Payer: Self-pay | Admitting: Pulmonary Disease

## 2024-05-22 DIAGNOSIS — B4481 Allergic bronchopulmonary aspergillosis: Secondary | ICD-10-CM

## 2024-05-22 LAB — SURGICAL PATHOLOGY

## 2024-05-23 ENCOUNTER — Other Ambulatory Visit
Admission: RE | Admit: 2024-05-23 | Discharge: 2024-05-23 | Disposition: A | Discharge status: Home / Self Care | Attending: Pulmonary Disease | Admitting: Pulmonary Disease

## 2024-05-23 DIAGNOSIS — B4481 Allergic bronchopulmonary aspergillosis: Secondary | ICD-10-CM | POA: Diagnosis present

## 2024-05-23 LAB — CBC WITH DIFFERENTIAL/PLATELET
Abs Immature Granulocytes: 0.07 K/uL (ref 0.00–0.07)
Basophils Absolute: 0.1 K/uL (ref 0.0–0.1)
Basophils Relative: 1 %
Eosinophils Absolute: 0.3 K/uL (ref 0.0–0.5)
Eosinophils Relative: 2 %
HCT: 36.8 % — ABNORMAL LOW (ref 39.0–52.0)
Hemoglobin: 12.8 g/dL — ABNORMAL LOW (ref 13.0–17.0)
Immature Granulocytes: 1 %
Lymphocytes Relative: 9 %
Lymphs Abs: 1.4 K/uL (ref 0.7–4.0)
MCH: 36.3 pg — ABNORMAL HIGH (ref 26.0–34.0)
MCHC: 34.8 g/dL (ref 30.0–36.0)
MCV: 104.2 fL — ABNORMAL HIGH (ref 80.0–100.0)
Monocytes Absolute: 1.1 K/uL — ABNORMAL HIGH (ref 0.1–1.0)
Monocytes Relative: 7 %
Neutro Abs: 12.6 K/uL — ABNORMAL HIGH (ref 1.7–7.7)
Neutrophils Relative %: 80 %
Platelets: 236 K/uL (ref 150–400)
RBC: 3.53 MIL/uL — ABNORMAL LOW (ref 4.22–5.81)
RDW: 14.9 % (ref 11.5–15.5)
WBC: 15.5 K/uL — ABNORMAL HIGH (ref 4.0–10.5)
nRBC: 0 % (ref 0.0–0.2)

## 2024-05-26 LAB — MISC LABCORP TEST (SEND OUT): Labcorp test code: 607660

## 2024-05-26 NOTE — Telephone Encounter (Signed)
 Copied from CRM [id number]. Topic: Clinical - Lab/Test Results >> May 15, 2024 10:39 AM Russell PARAS wrote: Reason for CRM:   Pt contacting results regarding his biopsy performed on 10/31. Results appear to have returned, with chart review.  Pt requested a call back for status update, or to review the results  CB#  [phone] >> May 26, 2024  4:17 PM Rozanna G wrote:  pt calling to check his results advised him of the note about the pathologist still reviewing.

## 2024-05-27 LAB — IMMUNOGLOBULINS A/E/G/M, SERUM
IgA: 461 mg/dL — ABNORMAL HIGH (ref 90–386)
IgE (Immunoglobulin E), Serum: 5865 [IU]/mL — ABNORMAL HIGH (ref 6–495)
IgG (Immunoglobin G), Serum: 1388 mg/dL (ref 603–1613)
IgM (Immunoglobulin M), Srm: 76 mg/dL (ref 20–172)

## 2024-05-27 NOTE — Progress Notes (Signed)
 The results of his pathology was negative for malignancy (no cancer).  I discussed this with him.  I am still waiting on some of the blood work that he had done last week.

## 2024-05-28 ENCOUNTER — Telehealth: Payer: Self-pay | Admitting: Oncology

## 2024-05-28 LAB — FUNGITELL BETA-D-GLUCAN: Fungitell Value:: 79.186 pg/mL

## 2024-05-28 NOTE — Telephone Encounter (Signed)
 Per inbasket from MD, schedule follow up in Jan. MD only.  I called and spoke with pt and confirmed the appt in Jan. Pt declined AVS to be mailed and that he will see appt in mychart.

## 2024-06-01 ENCOUNTER — Other Ambulatory Visit: Payer: Self-pay | Admitting: Hospice and Palliative Medicine

## 2024-06-02 NOTE — Telephone Encounter (Signed)
 Refill request from pharmacy /

## 2024-06-02 NOTE — Progress Notes (Signed)
 Spoke with pt to clarify that he has indeed gotten all of the labs needed by Dr. Tamea and she will review these as soon as she can. There are just duplicated orders in his chart that may have caused some confusion. NFN.

## 2024-06-02 NOTE — Telephone Encounter (Signed)
 Copied from CRM #[id number]. Topic: Clinical - Lab/Test Results >> May 30, 2024  3:13 PM Joesph PARAS wrote: Reason for CRM: Patient is calling to inquire about CMA message regarding lab work. Informed two pending labs appear to be ready for draw at [facility]. >> May 30, 2024  4:39 PM Joesph PARAS wrote: Patient went to [facility] to have other two labs drawn, [facility] states does not have any other labs that need to be drawn at this time. Requesting we re-send labs.   Please advise.

## 2024-06-04 ENCOUNTER — Other Ambulatory Visit: Payer: Self-pay

## 2024-06-04 ENCOUNTER — Encounter: Payer: Self-pay | Admitting: Oncology

## 2024-06-12 ENCOUNTER — Telehealth: Payer: Self-pay

## 2024-06-12 NOTE — Telephone Encounter (Signed)
 Copied from CRM #[id number]. Topic: General - Other >> Jun 12, 2024 12:29 PM Corean SAUNDERS wrote: Reason for CRM: Dr. Claudene with [facility] is requesting a call back from Dr. Tamea regarding mutual patient.  Cell: [phone]

## 2024-06-19 ENCOUNTER — Other Ambulatory Visit
Admission: RE | Admit: 2024-06-19 | Discharge: 2024-06-19 | Disposition: A | Discharge status: Home / Self Care | Source: Ambulatory Visit | Attending: Pulmonary Disease | Admitting: Pulmonary Disease

## 2024-06-19 ENCOUNTER — Ambulatory Visit: Admitting: Pulmonary Disease

## 2024-06-19 ENCOUNTER — Encounter: Payer: Self-pay | Admitting: Pulmonary Disease

## 2024-06-19 VITALS — BP 106/70 | HR 89 | Temp 97.6°F | Ht 73.0 in | Wt 119.6 lb

## 2024-06-19 DIAGNOSIS — R918 Other nonspecific abnormal finding of lung field: Secondary | ICD-10-CM | POA: Diagnosis not present

## 2024-06-19 DIAGNOSIS — B4481 Allergic bronchopulmonary aspergillosis: Secondary | ICD-10-CM | POA: Diagnosis present

## 2024-06-19 DIAGNOSIS — Z87891 Personal history of nicotine dependence: Secondary | ICD-10-CM | POA: Diagnosis not present

## 2024-06-19 DIAGNOSIS — D824 Hyperimmunoglobulin E [IgE] syndrome: Secondary | ICD-10-CM | POA: Insufficient documentation

## 2024-06-19 DIAGNOSIS — J449 Chronic obstructive pulmonary disease, unspecified: Secondary | ICD-10-CM | POA: Diagnosis not present

## 2024-06-19 DIAGNOSIS — C3491 Malignant neoplasm of unspecified part of right bronchus or lung: Secondary | ICD-10-CM

## 2024-06-19 LAB — CBC WITH DIFFERENTIAL/PLATELET
Abs Immature Granulocytes: 0.06 K/uL (ref 0.00–0.07)
Basophils Absolute: 0.1 K/uL (ref 0.0–0.1)
Basophils Relative: 1 %
Eosinophils Absolute: 0.2 K/uL (ref 0.0–0.5)
Eosinophils Relative: 2 %
HCT: 37.8 % — ABNORMAL LOW (ref 39.0–52.0)
Hemoglobin: 13.2 g/dL (ref 13.0–17.0)
Immature Granulocytes: 1 %
Lymphocytes Relative: 12 %
Lymphs Abs: 1.4 K/uL (ref 0.7–4.0)
MCH: 36.9 pg — ABNORMAL HIGH (ref 26.0–34.0)
MCHC: 34.9 g/dL (ref 30.0–36.0)
MCV: 105.6 fL — ABNORMAL HIGH (ref 80.0–100.0)
Monocytes Absolute: 0.9 K/uL (ref 0.1–1.0)
Monocytes Relative: 8 %
Neutro Abs: 9.4 K/uL — ABNORMAL HIGH (ref 1.7–7.7)
Neutrophils Relative %: 76 %
Platelets: 262 K/uL (ref 150–400)
RBC: 3.58 MIL/uL — ABNORMAL LOW (ref 4.22–5.81)
RDW: 14.6 % (ref 11.5–15.5)
WBC: 12.2 K/uL — ABNORMAL HIGH (ref 4.0–10.5)
nRBC: 0 % (ref 0.0–0.2)

## 2024-06-19 LAB — NITRIC OXIDE: Nitric Oxide: 5

## 2024-06-19 NOTE — Progress Notes (Unsigned)
 Subjective:    Patient ID: Samuel Choi, male    DOB: [birth year], 58 y.o.   MRN: [id number]  Patient Care Team: Sadie Manna, MD as PCP - General (Internal Medicine) Babara Call, MD as Consulting Physician (Hematology and Oncology) Fayette Bodily, MD as Consulting Physician (Infectious Diseases) Borders, Fonda SAUNDERS, NP as Nurse Practitioner (Hospice and Palliative Medicine) Tamea Dedra CROME, MD as Consulting Physician (Pulmonary Disease)  Chief Complaint  Patient presents with   COPD    Occasional shortness of breath on exertion.     BACKGROUND/INTERVAL:Mr. Alter is a 58 year old very complex former smoker with a history as noted below who presents for reevaluation of cavitary lesion noted on the right lung. 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 He developed Pseudomonas necrotizing/cavitary pneumonia in August 2023 and required IV meropenem  for eradication. He also has moderate/severe COPD and pulmonary hypertension. Prior history of DVT and PE as well on Eliquis . He was last seen on 18 April 2024.  Since his last visit he has been noted to have what appears to be a fungal small on his right lower lobe at the site of his previous cavitary Pseudomonas infection.  He has also been noted to have an elevated IgE of > 5000 and a positive Fungitell.  HPI Discussed the use of AI scribe software for clinical note transcription with the patient, who gave verbal consent to proceed.  History of Present Illness   Samuel Choi is a very complex 58 year old male, former smoker, with a history of squamous cell carcinoma of the lung and cavitary Pseudomonas infection, who presents for follow-up of elevated IgE and positive Fungitell.  He has a history of a lung lesion, previously identified as a fungus ball. This was noted on imaging as a space-occupying lesion in the lung. He has a history of a severe lung infection that required  prolonged antibiotic treatment.  He has a prior history of squamous cell carcinoma. 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  He reports his breathing is 'just fine' and denies any significant respiratory symptoms. He is currently using Breztri , which he finds helpful.   I reviewed the most recent laboratory data with the patient as well as the imaging studies.    Review of Systems A 10 point review of systems was performed and it is as noted above otherwise negative.   Patient Active Problem List   Diagnosis Date Noted   UTI (urinary tract infection) 12/07/2023   Syncope 09/11/2023   VTE (venous thromboembolism) 09/11/2023   History of CVA (cerebrovascular accident) 09/10/2023   At high risk for bleeding after thrombolytic therapy 09/10/2023   RSV (respiratory syncytial virus pneumonia) 07/06/2023   Protein-calorie malnutrition, severe 07/06/2023   Pneumonia due to respiratory syncytial virus (RSV) 07/05/2023   Chronic anticoagulation 07/05/2023   Severe pulmonary hypertension (HCC) 07/05/2023   Severe tricuspid regurgitation by prior echocardiogram 07/05/2023   Alcohol use disorder 07/05/2023   CHF (congestive heart failure) (HCC) 06/29/2023   Hypoxia 06/14/2023   History of lung cancer 06/14/2023   COPD with acute exacerbation (HCC) 06/14/2023   Acute on chronic respiratory failure with hypoxia (HCC) 06/14/2023   Necrotic pneumonia (HCC) 04/07/2022   Cavitary lesion of lung 02/11/2022   Acute respiratory failure (HCC) 02/10/2022   Lactic acidosis 02/10/2022   SVT (supraventricular tachycardia) 02/02/2022   Sepsis (HCC) 02/01/2022   Hyponatremia 02/01/2022   Acute on  chronic diastolic CHF (congestive heart failure) (HCC) 02/01/2022   Macrocytic anemia 02/01/2022   Elevated serum creatinine 01/25/2022   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 difficile colitis 10/19/2021    Hyperkalemia 07/28/2021   Protein calorie malnutrition 06/08/2021   Antineoplastic chemotherapy induced anemia 06/08/2021   Pulmonary embolus (HCC) 06/08/2021   Chemotherapy induced nausea and vomiting 05/17/2021   Symptomatic anemia 05/17/2021   Wide-complex tachycardia    Chemotherapy induced neutropenia 11/09/2020   COPD (chronic obstructive pulmonary disease) (HCC) 04/22/2020   Sinus tachycardia 12/18/2019   Hypomagnesemia 12/10/2019   Encounter for antineoplastic immunotherapy 11/24/2019   Ex-smoker 07/23/2019   Port-A-Cath in place 04/04/2019   Thrombocytopenia 04/04/2019   Folate deficiency 04/04/2019   Hypokalemia 04/04/2019   B12 deficiency 04/04/2019   Alcohol abuse 04/04/2019   Dehydration 02/07/2019   Encounter for antineoplastic chemotherapy 01/31/2019   Squamous cell carcinoma of right lung (HCC) 09/03/2018   Goals of care, counseling/discussion 09/03/2018   Malnutrition of moderate degree 08/23/2018   Emphysema of lung (HCC) 06/2018   Allergic rhinitis 06/2018   H/O nasal polyp 02/20/2018   Acute sinusitis 10/10/2017   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/10/2017   Chronic rhinitis 10/10/2017   Mass of sinus 10/10/2017   Essential hypertension 09/27/2017   Tobacco abuse 09/27/2017    Social History   Tobacco Use   Smoking status: Former    Current packs/day: 0.00    Average packs/day: 0.5 packs/day for 15.0 years (7.5 ttl pk-yrs)    Types: Cigarettes    Start date: 08/2003    Quit date: 08/2018    Years since quitting: 5.8   Smokeless tobacco: Never  Substance Use Topics   Alcohol use: Yes    Alcohol/week: 3.0 standard drinks of alcohol    Types: 3 Cans of beer per week    Comment: usually 2 drinks per week, per patient    Allergies[1]  Active Medications[2]  Immunization History  Administered Date(s) Administered   INFLUENZA, HIGH DOSE SEASONAL PF 03/28/2019   Influenza Inj Mdck Quad Pf 03/28/2019   Influenza, Mdck, Trivalent,PF 6+ MOS(egg free)  03/21/2023   Influenza,inj,Quad PF,6+ Mos 05/04/2021, 03/27/2022   Influenza-Unspecified 03/31/2022   Moderna Sars-Covid-2 Vaccination 11/14/2019   Pneumococcal Conjugate-13 02/17/2021   Pneumococcal Polysaccharide-23 11/19/2018   Tdap 12/31/2017   Zoster Recombinant(Shingrix) 03/28/2019, 05/01/2019, 06/09/2019        Objective:     Vitals:   06/19/24 0839  BP: 106/70  Pulse: 89  Temp: 97.6 F (36.4 C)  Height: 6' 1 (1.854 m)  Weight: 119 lb 9.6 oz (54.3 kg)  SpO2: 94%  TempSrc: Temporal  BMI (Calculated): 15.78     SpO2: 94 %  GENERAL: Thin, gentleman, presents fully ambulatory.  No acute distress. HEAD: Normocephalic, atraumatic.  Residual alopecia due to chemotherapy. EYES: Pupils equal, round, reactive to light.  No scleral icterus.  Disconjugate gaze (exotropia of right eye). MOUTH: Poor dentition, oral mucosa moist.  No thrush. NECK: Supple. No thyromegaly. Trachea midline. No JVD.  No adenopathy. PULMONARY: Good air entry bilaterally.  Coarse, scattered wheezes throughout. CARDIOVASCULAR: S1 and S2.  Regular rate and rhythm.  No rubs, murmurs or gallops heard. ABDOMEN: Scaphoid, otherwise benign. MUSCULOSKELETAL: No joint deformity, no clubbing, no edema.  Significant sarcopenia. NEUROLOGIC: Fully ambulatory today, occasional disconjugate gaze.  Otherwise no focality. SKIN: Intact,warm,dry. PSYCH: Mood and behavior normal.        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 Squamous cell carcinoma of right lung (HCC)  C34.91     3. Stage 2 moderate COPD by GOLD classification (HCC)  J44.9     4. 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 The condition is not cancerous but requires management to reduce inflammation and IgE levels. The fungus ball is likely due to Aspergillus invading empty spaces in the lung, exacerbated by previous lung infection. - Ordered blood test to confirm Aspergillus infection - Will initiate antifungal medication if blood test is positive - Will administer monthly IgE-lowering injection  Chronic obstructive pulmonary disease Breathing is stable, and airway inflammation is low. Wheezing is present but not severe. Continued use of Breztri  is beneficial. - Continue Breztri  for COPD management     Will see the patient in follow-up in 3 to 4 weeks time call sooner should any new problems arise.   Advised if symptoms do not improve or worsen, to please contact office for sooner follow up or seek emergency care.    I spent 50 minutes of dedicated to the care of this patient on the date of this encounter to include pre-visit review of records, face-to-face time with the patient discussing conditions above, post visit ordering of testing, clinical documentation with the electronic health record, making appropriate referrals as documented, and communicating necessary findings to members of the patients care team.     C. Leita Sanders, MD Advanced Bronchoscopy PCCM [facility]    *This note was generated using voice recognition software/Dragon and/or AI  transcription program.  Despite best efforts to proofread, errors can occur which can change the meaning. Any transcriptional errors that result from this process are unintentional and may not be fully corrected at the time of dictation.     [1] No Known Allergies [2]  Current Meds  Medication Sig   albuterol  (PROVENTIL ) (2.5 MG/3ML) 0.083% nebulizer solution Take 3 mLs (2.5 mg total) by nebulization every 6 (six) hours as needed for wheezing or shortness of breath.   albuterol  (VENTOLIN  HFA) 108 (90 Base) MCG/ACT inhaler INHALE 2 PUFFS BY MOUTH EVERY 6 HOURS AS NEEDED FOR WHEEZING OR SHORTNESS OF BREATH   benzonatate  (TESSALON ) 200 MG capsule Take 1 capsule (200 mg total) by mouth 3 (three) times daily as needed for cough.   budesonide -glycopyrrolate -formoterol  (BREZTRI  AEROSPHERE) 160-9-4.8 MCG/ACT AERO inhaler Inhale 2 puffs into the lungs in the morning and at bedtime.   cloNIDine  (CATAPRES ) 0.1  MG tablet Take 1 tablet (0.1 mg total) by mouth 3 (three) times daily.   cyanocobalamin  (VITAMIN B12) 1000 MCG tablet Take 1 tablet (1,000 mcg total) by mouth daily.   Dabigatran  Etexilate Mesylate (PRADAXA ) 150 MG PACK Take 150 mg by mouth in the morning and at bedtime.   diltiazem  (CARDIZEM  CD) 120 MG 24 hr capsule Take 120 mg by mouth daily.   feeding supplement (ENSURE ENLIVE / ENSURE PLUS) LIQD Take 237 mLs by mouth 3 (three) times daily between meals.   furosemide  (LASIX ) 40 MG tablet Take 1 tablet (40 mg total) by mouth daily as needed for fluid or edema.   latanoprost  (XALATAN ) 0.005 % ophthalmic solution Place 1 drop into both eyes at bedtime.   loratadine  (CLARITIN ) 10 MG tablet TAKE 1 TABLET(10 MG) BY MOUTH DAILY AS NEEDED FOR ALLERGIES   magnesium  chloride (SLOW-MAG) 64 MG TBEC SR tablet Take 1 tablet (64 mg total) by mouth 2 (two) times daily.   metoprolol  succinate (TOPROL -XL) 25 MG 24 hr tablet Take 1 tablet (25 mg total) by mouth daily.   Multiple Vitamin (MULTIVITAMIN WITH MINERALS)  TABS tablet Take 1 tablet by mouth daily.   ondansetron  (ZOFRAN ) 4 MG tablet Take 1 tablet (4 mg total) by mouth every 6 (six) hours as needed for nausea or vomiting.   pantoprazole  (PROTONIX ) 20 MG tablet TAKE 1 TABLET BY MOUTH DAILY   sulfamethoxazole -trimethoprim  (BACTRIM  DS) 800-160 MG tablet Take 1 tablet by mouth 2 (two) times daily.

## 2024-06-19 NOTE — Patient Instructions (Signed)
 VISIT SUMMARY:  You came in today for a follow-up on your elevated IgE levels and positive fungal findings. We discussed your history of a lung lesion, which was identified as a fungus ball, and your previous severe lung infection. You also have a history of squamous cell carcinoma and a benign Warthin's tumor. You reported that your breathing is fine and that you find Breztri  helpful.  YOUR PLAN:  -ALLERGIC BRONCHOPULMONARY ASPERGILLOSIS: This condition is an allergic reaction to the Aspergillus fungus, which can cause a fungus ball in the lung. It is not cancerous but needs to be managed to reduce inflammation and IgE levels. We have ordered a blood test to confirm the Aspergillus infection. If the test is positive, we will start you on antifungal medication and administer a monthly injection to lower your IgE levels.  -CHRONIC OBSTRUCTIVE PULMONARY DISEASE (COPD): COPD is a chronic lung disease that makes it hard to breathe. Your breathing is stable, and the inflammation in your airways is low. You have some wheezing, but it is not severe. Continue using Breztri  as it is beneficial for managing your COPD.  INSTRUCTIONS:  Please complete the blood test as ordered. If the test confirms an Aspergillus infection, we will start you on antifungal medication and monthly IgE-lowering injections. Continue using Breztri  for your COPD management. Follow up with us  as needed or if you experience any changes in your symptoms.

## 2024-06-20 ENCOUNTER — Other Ambulatory Visit
Admission: RE | Admit: 2024-06-20 | Discharge: 2024-06-20 | Disposition: A | Attending: Pulmonary Disease | Admitting: Pulmonary Disease

## 2024-06-21 LAB — IGE: IgE (Immunoglobulin E), Serum: 4786 [IU]/mL — ABNORMAL HIGH (ref 6–495)

## 2024-06-23 ENCOUNTER — Other Ambulatory Visit: Payer: Self-pay

## 2024-06-23 MED ORDER — SULFAMETHOXAZOLE-TRIMETHOPRIM 800-160 MG PO TABS: 1.0000 | ORAL_TABLET | Freq: Two times a day (BID) | ORAL | 2 refills | Status: DC

## 2024-06-23 NOTE — Telephone Encounter (Signed)
 Noted, NFN

## 2024-06-24 ENCOUNTER — Telehealth: Payer: Self-pay | Admitting: *Deleted

## 2024-06-24 LAB — ASPERGILLUS ANTIGEN, BAL/SERUM: Aspergillus Ag, BAL/Serum: 0.4 {index} (ref 0.00–0.49)

## 2024-06-24 NOTE — Telephone Encounter (Signed)
 Received incoming fmla for pt's wife. I reached out to pt's wife, who confirmed that she needed intermittent leave. Forms completed and pending Dr. Layvonne signature on the form. I will fax the form once signed. Wife requested a call to let her know when the document was faxed back.

## 2024-06-27 ENCOUNTER — Encounter: Payer: Self-pay | Admitting: Oncology

## 2024-06-27 NOTE — Telephone Encounter (Signed)
 Wife made aware that I faxed her fmla papers.

## 2024-07-10 ENCOUNTER — Encounter: Payer: Self-pay | Admitting: Oncology

## 2024-07-17 ENCOUNTER — Other Ambulatory Visit
Admission: RE | Admit: 2024-07-17 | Discharge: 2024-07-17 | Disposition: A | Discharge status: Home / Self Care | Attending: Pulmonary Disease | Admitting: Pulmonary Disease

## 2024-07-17 ENCOUNTER — Encounter: Payer: Self-pay | Admitting: Pulmonary Disease

## 2024-07-17 ENCOUNTER — Ambulatory Visit (INDEPENDENT_AMBULATORY_CARE_PROVIDER_SITE_OTHER): Admitting: Pulmonary Disease

## 2024-07-17 VITALS — BP 146/96 | HR 95 | Temp 97.8°F | Ht 73.0 in | Wt 118.0 lb

## 2024-07-17 DIAGNOSIS — J4540 Moderate persistent asthma, uncomplicated: Secondary | ICD-10-CM

## 2024-07-17 DIAGNOSIS — D824 Hyperimmunoglobulin E [IgE] syndrome: Secondary | ICD-10-CM | POA: Diagnosis present

## 2024-07-17 DIAGNOSIS — J4489 Other specified chronic obstructive pulmonary disease: Secondary | ICD-10-CM

## 2024-07-17 DIAGNOSIS — Z87891 Personal history of nicotine dependence: Secondary | ICD-10-CM | POA: Diagnosis not present

## 2024-07-17 DIAGNOSIS — I071 Rheumatic tricuspid insufficiency: Secondary | ICD-10-CM

## 2024-07-17 DIAGNOSIS — B4481 Allergic bronchopulmonary aspergillosis: Secondary | ICD-10-CM | POA: Insufficient documentation

## 2024-07-17 DIAGNOSIS — C3491 Malignant neoplasm of unspecified part of right bronchus or lung: Secondary | ICD-10-CM | POA: Diagnosis not present

## 2024-07-17 DIAGNOSIS — J329 Chronic sinusitis, unspecified: Secondary | ICD-10-CM

## 2024-07-17 DIAGNOSIS — R911 Solitary pulmonary nodule: Secondary | ICD-10-CM | POA: Diagnosis not present

## 2024-07-17 DIAGNOSIS — J984 Other disorders of lung: Secondary | ICD-10-CM

## 2024-07-17 DIAGNOSIS — R7689 Other specified abnormal immunological findings in serum: Secondary | ICD-10-CM

## 2024-07-17 LAB — CBC WITH DIFFERENTIAL/PLATELET
Abs Immature Granulocytes: 0.07 K/uL (ref 0.00–0.07)
Basophils Absolute: 0.1 K/uL (ref 0.0–0.1)
Basophils Relative: 1 %
Eosinophils Absolute: 0.2 K/uL (ref 0.0–0.5)
Eosinophils Relative: 1 %
HCT: 39.4 % (ref 39.0–52.0)
Hemoglobin: 13.7 g/dL (ref 13.0–17.0)
Immature Granulocytes: 0 %
Lymphocytes Relative: 8 %
Lymphs Abs: 1.3 K/uL (ref 0.7–4.0)
MCH: 37.5 pg — ABNORMAL HIGH (ref 26.0–34.0)
MCHC: 34.8 g/dL (ref 30.0–36.0)
MCV: 107.9 fL — ABNORMAL HIGH (ref 80.0–100.0)
Monocytes Absolute: 1.6 K/uL — ABNORMAL HIGH (ref 0.1–1.0)
Monocytes Relative: 10 %
Neutro Abs: 12.4 K/uL — ABNORMAL HIGH (ref 1.7–7.7)
Neutrophils Relative %: 80 %
Platelets: 236 K/uL (ref 150–400)
RBC: 3.65 MIL/uL — ABNORMAL LOW (ref 4.22–5.81)
RDW: 15.7 % — ABNORMAL HIGH (ref 11.5–15.5)
WBC: 15.6 K/uL — ABNORMAL HIGH (ref 4.0–10.5)
nRBC: 0 % (ref 0.0–0.2)

## 2024-07-17 NOTE — Progress Notes (Signed)
 "  Subjective:    Patient ID: Samuel Choi, male    DOB: Mar 05, 1966, 59 y.o.   MRN: [id number]  Patient Care Team: Samuel Manna, MD as PCP - General (Internal Medicine) Samuel Call, MD as Consulting Physician (Hematology and Oncology) Samuel Bodily, MD as Consulting Physician (Infectious Diseases) Borders, Fonda SAUNDERS, NP as Nurse Practitioner (Hospice and Palliative Medicine) Samuel Dedra CROME, MD as Consulting Physician (Pulmonary Disease)  Chief Complaint  Patient presents with   COPD    Cough, shortness of breath and wheezing. Using Breztri  daily and Albuterol  once daily.    BACKGROUND/INTERVAL:[name] is a 59 year old very complex former smoker with a history as noted below who presents for reevaluation of cavitary lesion noted on the right lung. 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 He developed Pseudomonas necrotizing/cavitary pneumonia in August 2023 and required IV meropenem  for eradication. He also has moderate/severe COPD and pulmonary hypertension. Prior history of DVT and PE as well on Eliquis . He was last seen on 19 June 2024.  Since October he has been noted to have what appears to be a fungal small on his right lower lobe at the site of his previous cavitary Pseudomonas infection.  He has also been noted to have an elevated IgE of > 5000 and a positive Fungitell.   HPI Discussed the use of AI scribe software for clinical note transcription with the patient, who gave verbal consent to proceed.  History of Present Illness   Samuel Choi is a 59 year old male with non-small cell carcinoma of the lung who presents for follow-up on a lung nodule and advanced COPD.  He has a persistent cough and has been informed of a 'fungal ball' in his lung. A previous biopsy was not concerning, initially thought to be a lymph node but was actually a salivary gland (Wharthin's tumor).  He has been reluctant to undergo  bronchoscopy for further characterization of the cavitary lung nodule that appears to have material within it consistent with a fungal ball.  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  No recent weight loss is noted, and his breathing is described as 'fine' overall. He experiences frequent hiccups.  He does have issues with wheezing frequently and cough.  He does have an asthmatic component to his COPD.  IgE's have been highly elevated.  He has a history of non-small cell carcinoma of the lung.       Review of Systems A 10 point review of systems was performed and it is as noted above otherwise negative.   Patient Active Problem List   Diagnosis Date Noted   UTI (urinary tract infection) 12/07/2023   Syncope 09/11/2023   VTE (venous thromboembolism) 09/11/2023   History of CVA (cerebrovascular accident) 09/10/2023   At high risk for bleeding after thrombolytic therapy 09/10/2023   RSV (respiratory syncytial virus pneumonia) 07/06/2023   Protein-calorie malnutrition, severe 07/06/2023   Pneumonia due to respiratory syncytial virus (RSV) 07/05/2023   Chronic anticoagulation 07/05/2023   Severe pulmonary hypertension (HCC) 07/05/2023   Severe tricuspid regurgitation by prior echocardiogram 07/05/2023   Alcohol use disorder 07/05/2023   CHF (congestive heart failure) (HCC) 06/29/2023   Hypoxia 06/14/2023   History of lung cancer 06/14/2023   COPD with acute exacerbation (HCC) 06/14/2023   Acute on chronic respiratory failure with hypoxia (HCC) 06/14/2023   Necrotic pneumonia (HCC) 04/07/2022   Cavitary lesion of lung 02/11/2022  Acute respiratory failure (HCC) 02/10/2022   Lactic acidosis 02/10/2022   SVT (supraventricular tachycardia) 02/02/2022   Sepsis (HCC) 02/01/2022   Hyponatremia 02/01/2022   Acute on chronic diastolic CHF (congestive heart failure) (HCC) 02/01/2022   Macrocytic anemia 02/01/2022   Elevated serum creatinine 01/25/2022   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 difficile colitis 10/19/2021   Hyperkalemia 07/28/2021   Protein calorie malnutrition 06/08/2021   Antineoplastic chemotherapy induced anemia 06/08/2021   Pulmonary embolus (HCC) 06/08/2021   Chemotherapy induced nausea and vomiting 05/17/2021   Symptomatic anemia 05/17/2021   Wide-complex tachycardia    Chemotherapy induced neutropenia 11/09/2020   COPD (chronic obstructive pulmonary disease) (HCC) 04/22/2020   Sinus tachycardia 12/18/2019   Hypomagnesemia 12/10/2019   Encounter for antineoplastic immunotherapy 11/24/2019   Ex-smoker 07/23/2019   Port-A-Cath in place 04/04/2019   Thrombocytopenia 04/04/2019   Folate deficiency 04/04/2019   Hypokalemia 04/04/2019   B12 deficiency 04/04/2019   Alcohol abuse 04/04/2019   Dehydration 02/07/2019   Encounter for antineoplastic chemotherapy 01/31/2019   Squamous cell carcinoma of right lung (HCC) 09/03/2018   Goals of care, counseling/discussion 09/03/2018   Malnutrition of moderate degree 08/23/2018   Emphysema of lung (HCC) 06/2018   Allergic rhinitis 06/2018   H/O nasal polyp 02/20/2018   Acute sinusitis 10/10/2017   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/10/2017   Chronic rhinitis 10/10/2017   Mass of sinus 10/10/2017   Essential hypertension 09/27/2017   Tobacco abuse 09/27/2017    Social History   Tobacco Use   Smoking status: Former    Current packs/day: 0.00    Average packs/day: 0.5 packs/day for 15.0 years (7.5 ttl pk-yrs)    Types: Cigarettes    Start date: 08/2003    Quit date: 08/2018    Years since quitting: 5.9   Smokeless tobacco: Never  Substance Use Topics   Alcohol use: Yes    Alcohol/week: 3.0 standard drinks of alcohol    Types: 3 Cans of beer per week    Comment: usually 2 drinks per week, per patient    Allergies[1]  Active Medications[2]  Immunization History  Administered Date(s) Administered   INFLUENZA, HIGH DOSE  SEASONAL PF 03/28/2019   Influenza Inj Mdck Quad Pf 03/28/2019   Influenza, Mdck, Trivalent,PF 6+ MOS(egg free) 03/21/2023   Influenza,inj,Quad PF,6+ Mos 05/04/2021, 03/27/2022   Influenza-Unspecified 03/31/2022   Moderna Sars-Covid-2 Vaccination 11/14/2019   Pneumococcal Conjugate-13 02/17/2021   Pneumococcal Polysaccharide-23 11/19/2018   Tdap 12/31/2017   Zoster Recombinant(Shingrix) 03/28/2019, 05/01/2019, 06/09/2019        Objective:     Vitals:   07/17/24 0833  BP: (!) 146/96  Pulse: 95  Temp: 97.8 F (36.6 C)  Height: 6' 1 (1.854 m)  Weight: 118 lb (53.5 kg)  SpO2: 95%  TempSrc: Temporal  BMI (Calculated): 15.57     SpO2: 95 %  GENERAL: Thin/cachectic, gentleman, presents fully ambulatory.  No acute distress. HEAD: Normocephalic, atraumatic.  Residual alopecia due to chemotherapy. EYES: Pupils equal, round, reactive to light.  No scleral icterus.  Disconjugate gaze (exotropia of right eye). MOUTH: Poor dentition, oral mucosa moist.  No thrush. NECK: Supple. No thyromegaly. Trachea midline. No JVD.  No adenopathy. PULMONARY: Good air entry bilaterally.  Coarse, few wheezes throughout. CARDIOVASCULAR: S1 and S2.  Regular rate and rhythm.  No rubs, murmurs or gallops heard. ABDOMEN: Scaphoid, otherwise benign. MUSCULOSKELETAL: No joint deformity, no clubbing, no edema.  Significant sarcopenia. NEUROLOGIC:  Fully ambulatory today, occasional disconjugate gaze.  Otherwise no focality. SKIN: Intact,warm,dry. PSYCH: Mood and behavior normal.    Assessment & Plan:     ICD-10-CM   1. Moderate persistent asthma, unspecified whether complicated  J45.40 Ambulatory referral to Pharmacotherapy Clinic    2. COPD with asthma (HCC)  J44.89 Ambulatory referral to Pharmacotherapy Clinic    3. Elevated IgE level  R76.89 CBC with Differential/Platelet    IgE    Ambulatory referral to Pharmacotherapy Clinic    4. 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 Chronic rhinosinusitis  J32.9 Ambulatory referral to Pharmacotherapy Clinic    6. 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    7. Squamous cell carcinoma of right lung (HCC)  C34.91 CT CHEST WO CONTRAST    8. 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  Given the patient's history of non-small cell carcinoma bronchoscopy was recommended.  Patient has declined bronchoscopy and prefers empiric therapy. - Initiated empiric therapy with Xolair considered itraconazole however has severe interactions with some of the patient's medication. - Continue other asthma therapy. - Place nodule on observation protocol, CT scan of the chest ordered for follow-up.  Chronic obstructive pulmonary disease (COPD) with asthma Reports wheezing intermittently.  Review of albuterol  usage shows that the patient refills this medication frequently.  No significant weight changes. Experiences frequent hiccups. Inflammation level in airway to be assessed to guide further management.  Patient however was unable to perform nitric oxide  level today. - Patient unable to perform nitric oxide  testing today. - Aggressive management of asthma as above.  Will see the patient in follow-up in 6 weeks time.  Will initiate Xolair.  Continue to monitor blood work.  Will also obtain CT chest as noted above.  Asked patient to reconsider bronchoscopy after ask patient to reconsider bronchoscopy pending follow-up CT results.     Advised if  symptoms do not improve or worsen, to please contact office for sooner follow up or seek emergency care.    I spent 40 minutes of dedicated to the care of this patient on the date of this encounter to include pre-visit review of records, face-to-face time with the patient discussing conditions above, post visit ordering of testing, clinical documentation with the electronic health record, making appropriate referrals as documented, and communicating necessary findings to members of the patients care team.     C. Leita Sanders, MD Advanced Bronchoscopy PCCM [facility]-    *This note was generated using voice recognition software/Dragon and/or AI transcription program.  Despite best efforts to proofread, errors can occur which can change the meaning. Any transcriptional errors that result from this process are unintentional and may not be fully corrected at the time of dictation.     [1] No Known Allergies [2]  Current Meds  Medication Sig   albuterol  (PROVENTIL ) (2.5 MG/3ML) 0.083% nebulizer solution Take 3 mLs (2.5 mg  total) by nebulization every 6 (six) hours as needed for wheezing or shortness of breath.   albuterol  (VENTOLIN  HFA) 108 (90 Base) MCG/ACT inhaler INHALE 2 PUFFS BY MOUTH EVERY 6 HOURS AS NEEDED FOR WHEEZING OR SHORTNESS OF BREATH   benzonatate  (TESSALON ) 200 MG capsule Take 1 capsule (200 mg total) by mouth 3 (three) times daily as needed for cough.   budesonide -glycopyrrolate -formoterol  (BREZTRI  AEROSPHERE) 160-9-4.8 MCG/ACT AERO inhaler Inhale 2 puffs into the lungs in the morning and at bedtime.   cloNIDine  (CATAPRES ) 0.1 MG tablet Take 1 tablet (0.1 mg total) by mouth 3 (three) times daily.   cyanocobalamin  (VITAMIN B12) 1000 MCG tablet Take 1 tablet (1,000 mcg total) by mouth daily.   Dabigatran  Etexilate Mesylate (PRADAXA ) 150 MG PACK Take 150 mg by mouth in the morning and at bedtime.   diltiazem  (CARDIZEM  CD) 120 MG 24 hr capsule Take 120 mg by  mouth daily.   feeding supplement (ENSURE ENLIVE / ENSURE PLUS) LIQD Take 237 mLs by mouth 3 (three) times daily between meals.   furosemide  (LASIX ) 40 MG tablet Take 1 tablet (40 mg total) by mouth daily as needed for fluid or edema.   latanoprost  (XALATAN ) 0.005 % ophthalmic solution Place 1 drop into both eyes at bedtime.   loratadine  (CLARITIN ) 10 MG tablet TAKE 1 TABLET(10 MG) BY MOUTH DAILY AS NEEDED FOR ALLERGIES   magnesium  chloride (SLOW-MAG) 64 MG TBEC SR tablet Take 1 tablet (64 mg total) by mouth 2 (two) times daily.   metoprolol  succinate (TOPROL -XL) 25 MG 24 hr tablet Take 1 tablet (25 mg total) by mouth daily.   Multiple Vitamin (MULTIVITAMIN WITH MINERALS) TABS tablet Take 1 tablet by mouth daily.   ondansetron  (ZOFRAN ) 4 MG tablet Take 1 tablet (4 mg total) by mouth every 6 (six) hours as needed for nausea or vomiting.   pantoprazole  (PROTONIX ) 20 MG tablet TAKE 1 TABLET BY MOUTH DAILY   sulfamethoxazole -trimethoprim  (BACTRIM  DS) 800-160 MG tablet Take 1 tablet by mouth 2 (two) times daily.   "

## 2024-07-17 NOTE — Patient Instructions (Addendum)
 VISIT SUMMARY:  During your visit, we discussed your lung nodule and advanced COPD. You reported a persistent cough and frequent hiccups, but no recent weight loss. We reviewed your previous biopsy results and discussed your current symptoms and treatment options.  YOUR PLAN:  -ELEVATED IGE WITH LUNG NODULE: Elevated IgE. You have a lung nodule that appears to be a fungal ball. We have started you on empiric therapy with Xolair to treat the high levels of IgE in your blood.  Unfortunately antifungal medications do interact with all of your other medicines.  -CHRONIC OBSTRUCTIVE PULMONARY DISEASE (COPD) WITH ASTHMA: COPD is a chronic inflammatory lung disease that causes obstructed airflow from the lungs. You reported that your breathing is overall fine, but you experience frequent hiccups. We assessed the level of inflammation in your airway to guide further management.  INSTRUCTIONS:  Please follow the prescribed treatment with Xolair. We will continue to monitor your COPD and adjust treatment as necessary based on the inflammation levels in your airway. If you experience any new symptoms or have concerns, please contact our office.

## 2024-07-21 LAB — IGE: IgE (Immunoglobulin E), Serum: 4945 [IU]/mL — ABNORMAL HIGH (ref 6–495)

## 2024-07-22 ENCOUNTER — Telehealth: Payer: Self-pay

## 2024-07-22 NOTE — Telephone Encounter (Signed)
 Received Xolair new start paperwork. Submitted a Prior Authorization request to [employer] for XOLAIR via CoverMyMeds. Will update once we receive a response.  Key: [id number]

## 2024-07-23 NOTE — Telephone Encounter (Signed)
 Received fax request with additional clinical questions. Completed questions and returned to fax number listed on request: [phone]

## 2024-07-24 NOTE — Telephone Encounter (Signed)
 Copied from CRM [id number]. Topic: Clinical - Medication Prior Auth >> Jul 24, 2024  4:47 PM Rozanna MATSU wrote: Reason for CRM: Mav with [employer] Prior Authorization stated they are needing additional question for the Xolair. Stated it was faxed today around 1pm and it will fax again around 445. The call back number is [phone] >> Jul 24, 2024  4:52 PM Rozanna MATSU wrote: The reference [id number] this case will expire per Mav at 728am CST. Stated may have to get a new case #.

## 2024-07-28 ENCOUNTER — Telehealth: Payer: Self-pay

## 2024-07-28 ENCOUNTER — Encounter: Payer: Self-pay | Admitting: Oncology

## 2024-07-28 ENCOUNTER — Inpatient Hospital Stay: Attending: Oncology | Admitting: Oncology

## 2024-07-28 ENCOUNTER — Inpatient Hospital Stay

## 2024-07-28 VITALS — BP 127/78 | HR 83 | Temp 96.6°F | Resp 18 | Wt 118.2 lb

## 2024-07-28 DIAGNOSIS — Z8673 Personal history of transient ischemic attack (TIA), and cerebral infarction without residual deficits: Secondary | ICD-10-CM | POA: Diagnosis not present

## 2024-07-28 DIAGNOSIS — N321 Vesicointestinal fistula: Secondary | ICD-10-CM

## 2024-07-28 DIAGNOSIS — B449 Aspergillosis, unspecified: Secondary | ICD-10-CM

## 2024-07-28 DIAGNOSIS — Z85118 Personal history of other malignant neoplasm of bronchus and lung: Secondary | ICD-10-CM | POA: Insufficient documentation

## 2024-07-28 DIAGNOSIS — Z95828 Presence of other vascular implants and grafts: Secondary | ICD-10-CM | POA: Diagnosis not present

## 2024-07-28 DIAGNOSIS — Z86718 Personal history of other venous thrombosis and embolism: Secondary | ICD-10-CM | POA: Diagnosis not present

## 2024-07-28 DIAGNOSIS — C3491 Malignant neoplasm of unspecified part of right bronchus or lung: Secondary | ICD-10-CM | POA: Diagnosis not present

## 2024-07-28 DIAGNOSIS — Z7901 Long term (current) use of anticoagulants: Secondary | ICD-10-CM | POA: Insufficient documentation

## 2024-07-28 MED ORDER — PRADAXA 150 MG PO PACK: 150.0000 mg | PACK | Freq: Two times a day (BID) | ORAL | 2 refills | Status: AC

## 2024-07-28 MED ORDER — SULFAMETHOXAZOLE-TRIMETHOPRIM 800-160 MG PO TABS: 1.0000 | ORAL_TABLET | Freq: Two times a day (BID) | ORAL | 3 refills | Status: AC

## 2024-07-28 MED ORDER — APIXABAN 5 MG PO TABS: 5.0000 mg | ORAL_TABLET | Freq: Two times a day (BID) | ORAL | 5 refills | Status: DC

## 2024-07-28 NOTE — Telephone Encounter (Signed)
 Patient called back and confirmed that he is only taking Pradaxa , Dr. Babara has sent Pradaxa  refill to pharmacy. Called [facility] to cancel Eliquis 

## 2024-07-28 NOTE — Assessment & Plan Note (Signed)
 Patient is on Pradaxa  150 mg twice daily for anticoagulation.

## 2024-07-28 NOTE — Assessment & Plan Note (Signed)
 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  Unfortunately he is not able to undergo urology intervention due to pulmonary hypertension. Continue Bactrim  as suppressive therapy.

## 2024-07-28 NOTE — Assessment & Plan Note (Signed)
 Unprovoked left lower extremity DVT, status post thrombolysis/thrombectomy, Nonocclusive PE.  Previously on Eliquis  5 mg twice daily.  Due to high cost, he was switched to Pradaxa  150 mg twice daily.

## 2024-07-28 NOTE — Assessment & Plan Note (Addendum)
 Interval increase of cystic lesion anterior right lower lobe. 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

## 2024-07-28 NOTE — Progress Notes (Signed)
 " Hematology/Oncology Progress note [phone] [phone]    Reason for Visit  Follow up for lung cancer treatments.   ASSESSMENT & PLAN:   Cancer Staging  Squamous cell carcinoma of right lung (HCC) Staging form: Lung, AJCC 8th Edition - Clinical stage from 07/08/2020: Stage IVA (rcT4, rcM1a) - Signed by Babara Call, MD on 07/08/2020   Squamous cell carcinoma of right lung (HCC) 07/16/2020-09/28/2020 carboplatin  Taxol  and Keytruda  x 1  partial response -Keytruda  maintenance.-04/22/2021, progression-resumed on carboplatin /Taxol /Keytruda  - did not tolerate due to myelosuppression.-->  Taxol  held, carboplatin /Keytruda --> CT 07/2020 progression --> 08/03/2021 started docetaxel  and ramucirumab .-->confirmed to have necrotizing pneumonia pseudomonas--> chemo discontinued.   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  Unfortunately he is not able to undergo urology intervention due to pulmonary hypertension. Continue Bactrim  as suppressive therapy.  History of CVA (cerebrovascular accident) Patient is on Pradaxa  150 mg twice daily for anticoagulation.  History of DVT and PE Unprovoked left lower extremity DVT, status post thrombolysis/thrombectomy, Nonocclusive PE.  Previously on Eliquis  5 mg twice daily.  Due to high cost, he was switched to Pradaxa  150 mg twice daily.   Orders Placed This Encounter  Procedures   CBC with Differential (Cancer Center Only)    Standing Status:   Future    Expected Date:   10/26/2024    Expiration Date:   01/24/2025   CMP (Cancer Center only)    Standing Status:   Future    Expected Date:   10/26/2024    Expiration Date:   01/24/2025    Follow-up 3 months  All questions were answered. The patient knows to call the clinic with any problems, questions or  concerns.  Call Babara, MD, PhD [facility] Hematology Oncology 07/28/2024     HPI  Oncology History  Squamous cell carcinoma of right lung [facility])  08/13/2018 Surgery   s/p Bronchoscopy on 06/25/2018. subcarina EBUS FNA was non diagnostic, hypocellular specimen.  08/13/18 Patient underwent right thoracotomy and wedge resection of a right upper lobe mass.  Frozen section was consistent with an inflammatory process.  On the second postop day, preliminary pathology reports high-grade malignancy.  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  08/20/2018 Patient was therefore taken back to the OR and underwent take complete lobectomy  pT1b pN0 cM0 stage I squamous cell lung cancer. Margin is negative.  Recommend observation.     08/13/2018 Initial Diagnosis   Stage I Squamous cell carcinoma of lung, right (HCC)   12/03/2018 Progression   12/03/2018 CT chest showed abnormal soft tissue in the right suprahilar region measuring up to 2.5 cm, worrisome for recurrence 12/10/2018 PET Hypermetabolic RIGHT suprahilar mass consistent with lung cancer, recurrence. 12/26/2018 s/p bronchoscopy biopsy. Confirmed local recurrence of squamous cell lung cancer.  medi port placed by Dr.Oaks. 01/20/19 MRI brain w/wo contrast: No evidence of metastatic disease   01/17/2019 - 03/07/2019 Chemotherapy   concurrent chemoradiation on 01/16/2019 Carboplatin  AUC of 2 and Taxol  45 mg/m2 weekly finished in 03/05/2019    03/31/2019 Imaging   03/31/19 CT chest showed Right suprahilar lesion has decreased slightly in size in the interval since prior PET-CT.  No new or progressive findings on today's study.   04/10/2019 - 05/20/2020 Chemotherapy   04/10/2019, patient started on durvalumab  Q 2 weeks maintenance.    06/14/2020 Progression   06/14/2020 CT chest w contrast  showed vidence of progressive lymphangitic spread of tumor throughout the right lung, with contralateral lung nodules with  right pleural effusion  07/01/22 MRI brain  is negative for CNS involvement.  CT abdomen with contrast showed no evidence of abdominal metastatic disease PET scan was not approved by insurance-peer to peer appeal with Dr.Vipul Bhanderi    07/08/2020 Cancer Staging   Staging form: Lung, AJCC 8th Edition - Clinical stage from 07/08/2020: Stage IVA (rcT4, cM1a) - Signed by Babara Call, MD on 07/08/2020   07/16/2020 - 09/28/2021 Chemotherapy   Carboplatin  + Paclitaxel  + Pembrolizumab  q21d x 4 cycles      10/15/2020 Imaging   10/15/2020 CT chest abdomen pelvis redemonstrated postoperative and postradiation appearance of the right chest with perihilar consolidation and fibrosis.  Slight interval decrease in size of the pulmonary nodules associated with interlobular septal thickening nearly complete resolution of previously seen left-sided pulmonary nodules.  Minimal irregular residual.  Right pleural effusion improved.  Consistent with treatment response.  No evidence of metastatic disease with the abdomen or pelvis.  None obstructive bilateral nephrolithiasis  - partial response to the treatment-  12/15/2020, CT chest without contrast showed resolution of lung nodules, no evidence of recurrent disease   10/19/2020 -  Chemotherapy   Keytruda  monotherapy maintenance   12/30/2020 [facility] Admission   patient was admitted due to unprovoked extensive deep vein thrombosis from calf vein to the common femoral vein on the left.  Patient was started on heparin  drip.  Patient underwent thrombolysis and is a colectomy on 12/31/2020.  Anticoagulation was switched to Eliquis  at discharge   04/22/2021 Imaging   CT chest with contrast showed none occlusive pulmonary embolism, Interval progression of consolidation airspace opacity in the medial right middle lobe with bandlike opacity and bronchiectasis in the perihilar right lung,-this is compatible with evolving posttreatment changes although recurrent disease anteriorly is not excluded.Interval development of  numerous bilateral pulmonary nodules, measuring up to 11 mm in the right lung base.  Concerning for metastatic disease.  Tiny right pleural effusion.    04/26/2021 -  Chemotherapy   resumed on carboplatin /Taxol /Keytruda . Patient did not tolerate his chemotherapy of carboplatin /Taxol /Keytruda  on 04/26/2021. Patient has developed severe anemia status post PRBC transfusion. 05/25/2021, carboplatin  AUC 5 with Keytruda  06/23/2021  continued on carboplatin  AUC 5 with Keytruda  07/07/2021 carboplatin  with keytruda       07/15/2021 Progression   07/15/2021 CT chest with contrast showed enlarged right lower lobe thick-walled cavitary lesion in the anterior aspect of the right lower lobe.  Concerning for progression.  Multiple other small pulmonary nodules are also noted elsewhere in the lungs, many of which are stable compared to prior studies.  Several are new or clearly enlarged.  The best example is an enlarging pulmonary nodule in the left upper lobe, 8 x 6 mm comparing to 4 x 3 mm on 04/22/2021   08/03/2021 - 02/14/2022 Chemotherapy   LUNG Docetaxel  + Ramucirumab  q21d      11/16/2021 Imaging   1. The previously noted thick-walled cavity of concern in the right lower lobe now appears more simple and thin-walled in appearance, presumably a post infectious cavity. There is a new adjacent thick-walled cavity in the right lower lobe which is aggressive in appearance, but given the evolution of the previous lesion, this may also be of infectious etiology. 2. However, there is an enlarging mixed cystic and solid nodule in the left upper lobe which is concerning for metastatic lesion or new primary lesion. This currently  measures 11 x 5 x 10 mm. Close attention on follow-up studies is recommended. 3. No definite signs of metastatic disease to the abdomen or pelvis. 4. Extensive colonic diverticulosis most involved in the sigmoid colon where there appear to be several small diverticular abscesses. one of these appears  to fistulized into the wall of the urinary bladder, without frank fistulization to the lumen of the urinary bladder at this time.    02/01/2022 - 02/05/2022 Hospital Admission   Patient was admitted for HCAP, sepsis and COPD exacerbation.  Patient was treated with IV vancomycin , cefepime , prednisone , added doxycycline  to cover atypicals given CT findings.  Patient was discharged with Augmentin  and doxycycline  and completed 7 days of course.   02/01/2022 Imaging   CT angiogram chest with and without contrast 1. Bilateral chronic pulmonary embolus but no acute pulmonary embolus. 2. Probable pulmonary arterial hypertension. 3.  Aortic Atherosclerosis (ICD10-I70.0).  Coronary atherosclerosis. 4. Improved airspace opacity along the peripheral cavitary process in the right lower lobe, currently the thin walled air cyst or cavity measures about 1.4 cm in diameter. 5. Increased peripheral tree-in-bud reticulonodular opacities in both lower lobes in the lingula, compatible with atypical infection. 6. Airway thickening and airway plugging particularly in the right lower lobe. 7. Mosaic attenuation in the lungs, stable but nonspecific.8.  Emphysema (ICD10-J43.9). 9. Stable radiation therapy related findings in the right perihilar regions. Right upper lobectomy.   02/10/2022 - 02/12/2022 Hospital Admission   Patient was admitted due to acute hypoxic respiratory failure CT findings highly suspicious for MAC Patient had AFB sputum x 3 and has follow-up appointment with ID to review results.  Patient was discharged on 2 L of nasal cannula oxygen   02/10/2022 Imaging   CT chest without contrast 1. Cavitary lesions of the anterior right lower lobe demonstrate increased wall thickening and there is a new irregular solid pulmonary nodule of the left upper lobe. Previously described clustered centrilobular nodules of the posterior right lower lobe have resolved, findings are favored to be due to chronic atypical  infection, likely non tuberculous mycobacterial. 2. Stable right paramediastinal postradiation change. 3. Similar bilateral mosaic attenuation, findings can be seen in setting of small airways disease or pulmonary hypertension.4. Aortic Atherosclerosis (ICD10-I70.0) and Emphysema (ICD10-J43.9).   07/17/2022 Imaging   CT chest abdomen pelvis w contrast 1. Unchanged post treatment appearance of the right chest, with dense, perihilar fibrotic bronchiectasis, consolidation, and architectural distortion. 2. Unchanged multilobulated cystic lesion of the anterior right lower lobe, with some adjacent irregular consolidation. Findings are most consistent with sequelae of prior infection. 3. Irregular nodules previously described in the left upper lobe are almost completely resolved, consistent with resolving infection or inflammation. 4. Background of very fine centrilobular and ground-glass nodularity, most concentrated in the lung apices, most commonly seen in smoking-related respiratory bronchiolitis. 5. No evidence of metastatic disease in the abdomen or pelvis.Aortic Atherosclerosis    10/20/2022 Imaging   CT chest wo contrast  1. Stable right perihilar postradiation change with no evidence of recurrent or metastatic disease. 2. Redemonstration of multiloculated cystic lesion of the anterior right lower lobe. Adjacent irregular consolidation is slightly decreased in size. Findings are likely sequela of prior infection. 3. Numerous small centrilobular pulmonary nodules which are most numerous in the upper lungs, similar to prior likely due to smoking-related respiratory bronchiolitis. 4. Aortic Atherosclerosis   08/27/2023 Imaging   CT chest abdomen pelvis w contrast showed 1. Status post right upper lobectomy. 2. An area of cystic bronchiectasis in  the right lung base demonstrates nodularity within, felt to be relatively similar to 06/14/2023. This could represent superimposed infection,  including fungal ball. Metastatic disease is felt less likely, especially given alteration of shaped/position today. Consider chest CT follow-up at 3 months. 3. Otherwise, no evidence of recurrent or metastatic disease. 4. Findings highly suspicious for colovesical fistula and superimposed cystitis. Likely secondary to interval episode of diverticulitis. 5. Aortic atherosclerosis (ICD10-I70.0), coronary artery atherosclerosis and emphysema (ICD10-J43.9). 6. Trace right pleural fluid 7. Persistent filling defect along the distal Port-A-Cath, which could represent adherent thrombus.   03/14/2024 Imaging   CT chest abdomen pelvis w contrast showed 1. Unchanged post treatment appearance of the chest with dense perihilar radiation fibrosis and consolidation of the right lung. 2. Redemonstrated complex cystic lesion of the anterior right lower lobe with a spiculated mural nodule of the posterior wall measuring 1.9 x 1.5 cm, previously 1.6 x 1.0 cm when measured similarly. Enlargement is worrisome for malignancy, although this could reflect infectious or inflammatory consolidation. PET-CT may be helpful to characterize for abnormal metabolic activity. 3. No evidence of lymphadenopathy or metastatic disease in the abdomen or pelvis. 4. Chronic diverticular abscess cavity embedded within the bladder dome, with diverticulosis of the adjacent sigmoid colon and chronic wall thickening, overall appearance unchanged. No acute inflammatory findings. 5. Emphysema. 6. Coronary artery disease.   04/22/2024 Imaging   PET scan 1. Hypermetabolic right lower lobe nodule, worrisome for bronchogenic carcinoma. Hypermetabolic mediastinal and left cervical lymph nodes worrisome for N3 disease. 2. Hypermetabolism in the distal descending colon and sigmoid colon may be inflammatory in etiology, given history of diverticulitis. Malignancy certainly cannot be excluded. Correlation with recent colonoscopy is  suggested. 3. Large chronic diverticulum of the sigmoid colon with air in the bladder, consistent with a colovesical fistula. 4.  Age advanced two vessel coronary artery calcification. 5. Small pericardial effusion. 6.  Aortic atherosclerosis (ICD10-I70.0). 7.  Emphysema (ICD10-J43.9).   05/09/2024 Procedure   Ultrasound core biopsy showed 1. Lymph node, biopsy, HM Left Cervical :       - WARTHIN'S TUMOR       - SCANT BENIGN SALIVARY GLAND PARENCHYMA       - LYMPH NODE TISSUE IS NOT IDENTIFIED       - NO EVIDENCE OF MALIGNANCY     03/06/2022, sputum collected shows Pseudomonas  aeruginosa which explains his findings on the CT scan.  There were 2 types of pseudomonas colonies and they both are resistant o ciprofloxacin . patient was referred to infectious disease and was seen by Dr. Searcy on 03/09/2022.  Patient was treated with meropenem  and completed his course on 04/04/2022.    07/04/2024- 07/08/2024 He has had pneumonia due to RSV.  09/10/2023 - 09/12/2023  patient was admitted due to acute ischemic stroke.  He was supposed to take prophylactic anticoagulation with Eliquis  2.5 mg twice daily, patient self discontinued and was recommended to start Xarelto  10 mg daily.  Per admission note, patient was not taking Xarelto . Patient was evaluated by neurology.  He is left hemiparesis and weakness resolved.  Patient was started back on Eliquis  5 mg twice daily.  Patient also was treated for UTI.    April 2025, patient presented to primary care provider with symptoms of dysuria.  Patient was diagnosed with UTI and treated with Cefdinir.  INTERVAL HISTORY Samuel Choi is a 59 y.o. male who has above history reviewed by me today presents for follow up visit for metastatic squamous cell carcinoma of  lung.  Patient is on chronic antibiotics for colovesical fistula due to not able to get urologic intervention due to cardiopulmonary issues.  Chronic dysuria symptoms  No fever or chills.     05/09/2024 status post biopsy of left cervical lymph node Results showed Warthin's tumor, scant benign salivary gland parenchyma.  Lymph node tissue is not identified.  No evidence of malignancy.  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 Initiate empiric therapy with Xolair considered hydrocortisone however medication has interactions with other of his treatments. As repeat CT is scheduled this week.  Patient is currently on Pradaxa  150 mg twice daily due to high cost of Eliquis .  Denies any bleeding events.  Today patient reports feeling well.  Denies fever, chills, shortness of breath, he maintain his weight of 118 pounds.  Appetite is fair.   Review of Systems  Constitutional:  Positive for fatigue and unexpected weight change. Negative for appetite change, chills and fever.  HENT:   Negative for hearing loss and voice change.   Eyes:  Negative for eye problems and icterus.  Respiratory:  Positive for shortness of breath. Negative for chest tightness and cough.   Cardiovascular:  Negative for chest pain and leg swelling.  Gastrointestinal:  Negative for abdominal distention, abdominal pain and blood in stool.  Endocrine: Negative for hot flashes.  Genitourinary:  Positive for dysuria and frequency. Negative for difficulty urinating.   Musculoskeletal:  Negative for arthralgias.  Skin:  Negative for itching and rash.  Neurological:  Negative for extremity weakness, light-headedness and numbness.  Hematological:  Negative for adenopathy. Does not bruise/bleed easily.  Psychiatric/Behavioral:  Negative for confusion.       VITALS:  Blood pressure 127/78, pulse 83, temperature (!) 96.6 F (35.9 C), temperature source Tympanic, resp. rate 18, weight 118 lb 3.2 oz (53.6 kg), SpO2 100%.  Wt Readings  from Last 3 Encounters:  07/28/24 118 lb 3.2 oz (53.6 kg)  07/17/24 118 lb (53.5 kg)  06/19/24 119 lb 9.6 oz (54.3 kg)    Body mass index is 15.59 kg/m.  Performance status (ECOG): 1 - Symptomatic but completely ambulatory  PHYSICAL EXAM:  Physical Exam HENT:     Head: Normocephalic and atraumatic.  Eyes:     General: No scleral icterus. Cardiovascular:     Rate and Rhythm: Normal rate.  Pulmonary:     Effort: Pulmonary effort is normal.     Breath sounds: No wheezing or rales.     Comments: Decreased breath sound bilaterally  Abdominal:     General: There is no distension.     Palpations: Abdomen is soft.     Tenderness: There is no abdominal tenderness.  Musculoskeletal:        General: No swelling.     Cervical back: Normal range of motion.  Skin:    General: Skin is warm and dry.  Neurological:     General: No focal deficit present.     Mental Status: He is alert. Mental status is at baseline.  Psychiatric:        Mood and Affect: Mood normal.     LABS:      Latest Ref Rng & Units 07/17/2024   10:11 AM 06/19/2024    9:45 AM 05/23/2024    1:46 PM  CBC  WBC 4.0 - 10.5 K/uL 15.6  12.2  15.5  Hemoglobin 13.0 - 17.0 g/dL 86.2  86.7  87.1   Hematocrit 39.0 - 52.0 % 39.4  37.8  36.8   Platelets 150 - 400 K/uL 236  262  236       Latest Ref Rng & Units 03/17/2024   10:50 AM 12/14/2023    8:21 AM 12/07/2023    9:19 AM  CMP  Glucose 70 - 99 mg/dL 96  897  894   BUN 6 - 20 mg/dL 17  9  7    Creatinine 0.61 - 1.24 mg/dL 8.91  8.94  9.08   Sodium 135 - 145 mmol/L 131  135  135   Potassium 3.5 - 5.1 mmol/L 3.9  4.6  2.9   Chloride 98 - 111 mmol/L 98  105  101   CO2 22 - 32 mmol/L 22  21  24    Calcium  8.9 - 10.3 mg/dL 8.7  8.4  8.1   Total Protein 6.5 - 8.1 g/dL 7.4     Total Bilirubin 0.0 - 1.2 mg/dL 0.4     Alkaline Phos 38 - 126 U/L 91     AST 15 - 41 U/L 18     ALT 0 - 44 U/L 10        HISTORY:   Past Medical History:  Diagnosis Date   Acute ischemic  stroke (HCC) 09/11/2023   a.) MRI brain 09/11/2023: acute infarct along the anterior aspect of the RIGHT precentral gyrus   Alcohol abuse    usually drinks 2-3 drinks per day   Anemia    Aortic atherosclerosis    CAD (coronary artery disease)    Cancer associated pain    Cerebral microvascular disease    Chronic sinusitis    Clostridioides difficile infection 06/27/2019   Colonic diverticular abscess 08/27/2023   Colovesical fistula 08/27/2023   COPD (chronic obstructive pulmonary disease) (HCC)    Deep vein thrombosis (DVT) of left lower extremity (HCC) 12/30/2020   a.) calf veins to the common femoral vein on the LEFT; s/p mechanical thrombectomy 12/31/2020   Diverticulosis    DOE (dyspnea on exertion)    Hepatic steatosis    HFrEF (heart failure with reduced ejection fraction) (HCC)    Hip fracture (HCC) 05/2018   no surgery   History of kidney stones 05/2018   Hypertension    Inappropriate sinus tachycardia    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 mass benign   ELECTROMAGNETIC NAVIGATION BROCHOSCOPY Right 06/25/2018   Procedure: ELECTROMAGNETIC NAVIGATION BRONCHOSCOPY;  Surgeon: Isaiah Scrivener, MD;  Location: [facility];  Service: Cardiopulmonary;   Laterality: Right;   ENDOBRONCHIAL ULTRASOUND Right 06/25/2018   Procedure: ENDOBRONCHIAL ULTRASOUND;  Surgeon: Isaiah Scrivener, MD;  Location: [facility];  Service: Cardiopulmonary;  Laterality: Right;   ENDOBRONCHIAL ULTRASOUND Right 12/26/2018  Procedure: ENDOBRONCHIAL ULTRASOUND RIGHT;  Surgeon: Kasa, Kurian, MD;  Location: [facility];  Service: Cardiopulmonary;  Laterality: Right;   FLEXIBLE BRONCHOSCOPY N/A 08/20/2018   Procedure: FLEXIBLE BRONCHOSCOPY PREOP;  Surgeon: Volney Lye, MD;  Location: [facility];  Service: Thoracic;  Laterality: N/A;   IR CV LINE INJECTION  04/04/2019   NASAL SINUS SURGERY  10/2017   At [facility], frontal sinusotomy, ethmoidectomy, resection anterior cranial fossa neoplasm, turbinate resection   PERIPHERAL VASCULAR THROMBECTOMY Left 12/31/2020   Procedure: PERIPHERAL VASCULAR THROMBECTOMY;  Surgeon: Marea Selinda RAMAN, MD;  Location: [facility];  Service: Cardiovascular;  Laterality: Left;   PORTACATH PLACEMENT Left 01/15/2019   Procedure: INSERTION PORT-A-CATH;  Surgeon: Volney Lye, MD;  Location: [facility];  Service: General;  Laterality: Left;   THORACOTOMY Right 08/13/2018   Procedure: PREOP BROCHOSCOPY WITH RIGHT THORACOTOMY AND RUL RESECTION;  Surgeon: Volney Lye, MD;  Location: [facility];  Service: General;  Laterality: Right;   THORACOTOMY Right 08/20/2018   Procedure: THORACOTOMY MAJOR RIGHT UPPER LOBE LOBECTOMY;  Surgeon: Volney Lye, MD;  Location: [facility];  Service: Thoracic;  Laterality: Right;   TOE SURGERY Left    pin in left toe    Family History  Problem Relation Age of Onset   Breast cancer Mother    Diabetes Mother    Lung cancer Father    Hypertension Father     Social History:  reports that he quit smoking about 5 years ago. His smoking use included cigarettes. He started smoking about 20 years ago. He has a 7.5 pack-year smoking history. He has never used smokeless tobacco. He reports current alcohol use of about 3.0 standard drinks of  alcohol per week. He reports that he does not use drugs.  Allergies: No Known Allergies  Current Medications: Current Outpatient Medications  Medication Sig Dispense Refill   albuterol  (PROVENTIL ) (2.5 MG/3ML) 0.083% nebulizer solution Take 3 mLs (2.5 mg total) by nebulization every 6 (six) hours as needed for wheezing or shortness of breath. 120 mL 0   albuterol  (VENTOLIN  HFA) 108 (90 Base) MCG/ACT inhaler INHALE 2 PUFFS BY MOUTH EVERY 6 HOURS AS NEEDED FOR WHEEZING OR SHORTNESS OF BREATH 8.5 g 11   benzonatate  (TESSALON ) 200 MG capsule Take 1 capsule (200 mg total) by mouth 3 (three) times daily as needed for cough. 20 capsule 0   budesonide -glycopyrrolate -formoterol  (BREZTRI  AEROSPHERE) 160-9-4.8 MCG/ACT AERO inhaler Inhale 2 puffs into the lungs in the morning and at bedtime. 10.7 g 6   cloNIDine  (CATAPRES ) 0.1 MG tablet Take 1 tablet (0.1 mg total) by mouth 3 (three) times daily.     cyanocobalamin  (VITAMIN B12) 1000 MCG tablet Take 1 tablet (1,000 mcg total) by mouth daily. 90 tablet 1   diltiazem  (CARDIZEM  CD) 120 MG 24 hr capsule Take 120 mg by mouth daily.     feeding supplement (ENSURE ENLIVE / ENSURE PLUS) LIQD Take 237 mLs by mouth 3 (three) times daily between meals. 237 mL 12   furosemide  (LASIX ) 40 MG tablet Take 1 tablet (40 mg total) by mouth daily as needed for fluid or edema. 30 tablet 0   latanoprost  (XALATAN ) 0.005 % ophthalmic solution Place 1 drop into both eyes at bedtime.     loratadine  (CLARITIN ) 10 MG tablet TAKE 1 TABLET(10 MG) BY MOUTH DAILY AS NEEDED FOR ALLERGIES 90 tablet 1   magnesium  chloride (SLOW-MAG) 64 MG TBEC SR tablet Take 1 tablet (64 mg total) by mouth 2 (two) times daily. 180 tablet 1  metoprolol  succinate (TOPROL -XL) 25 MG 24 hr tablet Take 1 tablet (25 mg total) by mouth daily. 30 tablet 2   Multiple Vitamin (MULTIVITAMIN WITH MINERALS) TABS tablet Take 1 tablet by mouth daily.     ondansetron  (ZOFRAN ) 4 MG tablet Take 1 tablet (4 mg total) by mouth  every 6 (six) hours as needed for nausea or vomiting. 60 tablet 1   pantoprazole  (PROTONIX ) 20 MG tablet TAKE 1 TABLET BY MOUTH DAILY 100 tablet 2   Dabigatran  Etexilate Mesylate (PRADAXA ) 150 MG PACK Take 150 mg by mouth in the morning and at bedtime. 60 each 2   potassium chloride  SA (KLOR-CON  M) 20 MEQ tablet Take 1 tablet (20 mEq total) by mouth 2 (two) times daily. (Patient not taking: Reported on 07/28/2024) 30 tablet 0   sulfamethoxazole -trimethoprim  (BACTRIM  DS) 800-160 MG tablet Take 1 tablet by mouth 2 (two) times daily. 60 tablet 3   No current facility-administered medications for this visit.   Facility-Administered Medications Ordered in Other Visits  Medication Dose Route Frequency Provider Last Rate Last Admin   dexamethasone  (DECADRON ) 10 MG/ML injection            heparin  lock flush 100 unit/mL  500 Units Intravenous Once Babara Call, MD       heparin  lock flush 100 unit/mL  500 Units Intravenous Once Babara Call, MD       sodium chloride  flush (NS) 0.9 % injection 10 mL  10 mL Intravenous PRN Babara Call, MD   10 mL at 04/04/19 0903   sodium chloride  flush (NS) 0.9 % injection 10 mL  10 mL Intravenous PRN Babara Call, MD   10 mL at 07/26/20 1308   sodium chloride  flush (NS) 0.9 % injection 10 mL  10 mL Intravenous PRN Babara Call, MD   10 mL at 07/28/21 0828   sodium chloride  flush (NS) 0.9 % injection 10 mL  10 mL Intravenous PRN Babara Call, MD   10 mL at 12/21/22 1308       IMAGE STUDIES:  US  CORE BIOPSY (LYMPH NODES) Result Date: 05/09/2024 INDICATION: LT Cervical lymph node.  History of hypermetabolic RIGHT lung mass. EXAM: ULTRASOUND-GUIDED LEFT CERVICAL LYMPH NODE BIOPSY COMPARISON:  PET-CT, 04/22/2024. CT CAP, 03/07/2024. CTA NECK, 09/10/2023. MEDICATIONS: None ANESTHESIA/SEDATION: Local anesthetic was administered. COMPLICATIONS: None immediate. TECHNIQUE: Informed written consent was obtained from the patient after a discussion of the risks, benefits and alternatives to treatment.  Questions regarding the procedure were encouraged and answered. Initial ultrasound scanning demonstrated a pathologically-enlarged LEFT cervical lymph node. An ultrasound image was saved for documentation purposes. The procedure was planned. A timeout was performed prior to the initiation of the procedure. The operative was prepped and draped in the usual sterile fashion, and a sterile drape was applied covering the operative field. A timeout was performed prior to the initiation of the procedure. Local anesthesia was provided with 1% lidocaine  with epinephrine. Under direct ultrasound guidance, an 18 gauge core needle device was utilized to obtain to obtain 4 core needle biopsies of the LEFT cervical lymph node. The samples were placed in saline and submitted to pathology. The needle was removed and superficial hemostasis was achieved with manual compression. Post procedure scan was negative for significant hematoma. A dressing was applied. The patient tolerated the procedure well without immediate postprocedural complication. IMPRESSION: Successful ultrasound guided biopsy of the enlarged LEFT cervical lymph node. Thom Hall, MD [facility] Electronically Signed   By: Thom Hall  M.D.   On: 05/09/2024 15:40        "

## 2024-07-28 NOTE — Assessment & Plan Note (Signed)
 07/16/2020-09/28/2020 carboplatin  Taxol  and Keytruda  x 1  partial response -Keytruda  maintenance.-04/22/2021, progression-resumed on carboplatin /Taxol /Keytruda  - did not tolerate due to myelosuppression.-->  Taxol  held, carboplatin /Keytruda --> CT 07/2020 progression --> 08/03/2021 started docetaxel  and ramucirumab .-->confirmed to have necrotizing pneumonia pseudomonas--> chemo discontinued.   Patient has repeat CT without contrast scan scheduled

## 2024-07-28 NOTE — Assessment & Plan Note (Signed)
Continue port flush every 6-8 weeks.  

## 2024-07-30 NOTE — Telephone Encounter (Signed)
 Received a fax regarding Prior Authorization from [employer] for XOLAIR. 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  Phone# [phone]  Submitted an urgent appeal to [facility], will update when we receive a response.  Fax #: [phone]

## 2024-07-31 ENCOUNTER — Ambulatory Visit
Admission: RE | Admit: 2024-07-31 | Discharge: 2024-07-31 | Disposition: A | Discharge status: Home / Self Care | Source: Ambulatory Visit | Attending: Internal Medicine | Admitting: Internal Medicine

## 2024-07-31 DIAGNOSIS — C3491 Malignant neoplasm of unspecified part of right bronchus or lung: Secondary | ICD-10-CM | POA: Diagnosis present

## 2024-07-31 DIAGNOSIS — J984 Other disorders of lung: Secondary | ICD-10-CM | POA: Diagnosis present

## 2024-08-05 ENCOUNTER — Ambulatory Visit: Payer: Self-pay | Admitting: Pulmonary Disease

## 2024-08-14 ENCOUNTER — Other Ambulatory Visit: Payer: Self-pay | Admitting: Oncology

## 2024-08-14 ENCOUNTER — Other Ambulatory Visit (HOSPITAL_COMMUNITY): Payer: Self-pay

## 2024-08-14 NOTE — Telephone Encounter (Signed)
 Called pt and he agreed to plan to submit PAP application. Completed Xolair enrollment form and faxed with insurance card, med list, pa approval and test claim to [place]. Will update when we receive a response.  Phone #: [phone] Fax #: [phone]

## 2024-08-14 NOTE — Telephone Encounter (Addendum)
 Called [employer] at [phone] to check on status of appeal. Per rep appeal for DARRA was OVERTURNED from 07/22/24 - 07/09/25. Approval letter has been sent to scan center.  Per test claim copay for 28 day supply is $1247.46. Will likely have to proceed with submitting PAP.  Case #: [id number]

## 2024-09-01 ENCOUNTER — Ambulatory Visit: Admitting: Pulmonary Disease

## 2024-09-08 ENCOUNTER — Inpatient Hospital Stay

## 2024-10-27 ENCOUNTER — Inpatient Hospital Stay

## 2024-10-27 ENCOUNTER — Inpatient Hospital Stay: Admitting: Oncology
